# Patient Record
Sex: Female | Born: 1955 | Race: White | Hispanic: No | Marital: Single | Smoking: Former smoker
Health system: Southern US, Community
[De-identification: ages and names within clinical notes are randomized; demographics above are authoritative.]

## PROBLEM LIST (undated history)

## (undated) DIAGNOSIS — M1990 Unspecified osteoarthritis, unspecified site: Secondary | ICD-10-CM

## (undated) DIAGNOSIS — M545 Low back pain, unspecified: Secondary | ICD-10-CM

## (undated) DIAGNOSIS — K219 Gastro-esophageal reflux disease without esophagitis: Secondary | ICD-10-CM

## (undated) DIAGNOSIS — I4891 Unspecified atrial fibrillation: Secondary | ICD-10-CM

## (undated) DIAGNOSIS — E119 Type 2 diabetes mellitus without complications: Secondary | ICD-10-CM

## (undated) DIAGNOSIS — R0602 Shortness of breath: Secondary | ICD-10-CM

## (undated) DIAGNOSIS — I82409 Acute embolism and thrombosis of unspecified deep veins of unspecified lower extremity: Secondary | ICD-10-CM

## (undated) DIAGNOSIS — I2699 Other pulmonary embolism without acute cor pulmonale: Secondary | ICD-10-CM

## (undated) DIAGNOSIS — F419 Anxiety disorder, unspecified: Secondary | ICD-10-CM

## (undated) DIAGNOSIS — R51 Headache: Secondary | ICD-10-CM

## (undated) DIAGNOSIS — F329 Major depressive disorder, single episode, unspecified: Secondary | ICD-10-CM

## (undated) DIAGNOSIS — I10 Essential (primary) hypertension: Secondary | ICD-10-CM

## (undated) DIAGNOSIS — E7800 Pure hypercholesterolemia, unspecified: Secondary | ICD-10-CM

## (undated) DIAGNOSIS — I509 Heart failure, unspecified: Secondary | ICD-10-CM

## (undated) DIAGNOSIS — J189 Pneumonia, unspecified organism: Secondary | ICD-10-CM

## (undated) DIAGNOSIS — R58 Hemorrhage, not elsewhere classified: Secondary | ICD-10-CM

## (undated) DIAGNOSIS — M109 Gout, unspecified: Secondary | ICD-10-CM

## (undated) DIAGNOSIS — T45515A Adverse effect of anticoagulants, initial encounter: Secondary | ICD-10-CM

## (undated) DIAGNOSIS — F32A Depression, unspecified: Secondary | ICD-10-CM

## (undated) DIAGNOSIS — Z9289 Personal history of other medical treatment: Secondary | ICD-10-CM

## (undated) DIAGNOSIS — D649 Anemia, unspecified: Secondary | ICD-10-CM

## (undated) DIAGNOSIS — G43909 Migraine, unspecified, not intractable, without status migrainosus: Secondary | ICD-10-CM

## (undated) DIAGNOSIS — G8929 Other chronic pain: Secondary | ICD-10-CM

## (undated) DIAGNOSIS — R011 Cardiac murmur, unspecified: Secondary | ICD-10-CM

## (undated) DIAGNOSIS — E039 Hypothyroidism, unspecified: Secondary | ICD-10-CM

## (undated) DIAGNOSIS — G4733 Obstructive sleep apnea (adult) (pediatric): Secondary | ICD-10-CM

## (undated) DIAGNOSIS — M7989 Other specified soft tissue disorders: Secondary | ICD-10-CM

## (undated) DIAGNOSIS — N289 Disorder of kidney and ureter, unspecified: Secondary | ICD-10-CM

## (undated) DIAGNOSIS — N390 Urinary tract infection, site not specified: Secondary | ICD-10-CM

## (undated) HISTORY — PX: PARS PLANA REPAIR OF RETINAL DEATACHMENT: SHX2165

## (undated) HISTORY — PX: REFRACTIVE SURGERY: SHX103

## (undated) HISTORY — PX: CHOLECYSTECTOMY: SHX55

## (undated) HISTORY — PX: VENA CAVA FILTER PLACEMENT: SUR1032

## (undated) HISTORY — PX: PARS PLANA VITRECTOMY: SHX2166

## (undated) HISTORY — PX: CATARACT EXTRACTION W/ INTRAOCULAR LENS  IMPLANT, BILATERAL: SHX1307

## (undated) HISTORY — PX: EYE SURGERY: SHX253

---

## 1997-10-12 ENCOUNTER — Emergency Department (HOSPITAL_COMMUNITY): Admission: EM | Admit: 1997-10-12 | Discharge: 1997-10-12 | Payer: Self-pay | Admitting: Emergency Medicine

## 1998-08-18 ENCOUNTER — Encounter: Payer: Self-pay | Admitting: Emergency Medicine

## 1998-08-18 ENCOUNTER — Emergency Department (HOSPITAL_COMMUNITY): Admission: EM | Admit: 1998-08-18 | Discharge: 1998-08-18 | Payer: Self-pay | Admitting: Emergency Medicine

## 2000-01-14 ENCOUNTER — Emergency Department (HOSPITAL_COMMUNITY): Admission: EM | Admit: 2000-01-14 | Discharge: 2000-01-14 | Payer: Self-pay | Admitting: Emergency Medicine

## 2000-01-14 ENCOUNTER — Encounter: Payer: Self-pay | Admitting: Emergency Medicine

## 2000-01-29 ENCOUNTER — Ambulatory Visit (HOSPITAL_COMMUNITY): Admission: RE | Admit: 2000-01-29 | Discharge: 2000-01-29 | Payer: Self-pay | Admitting: Surgery

## 2000-02-13 ENCOUNTER — Encounter (INDEPENDENT_AMBULATORY_CARE_PROVIDER_SITE_OTHER): Payer: Self-pay

## 2000-02-13 ENCOUNTER — Ambulatory Visit (HOSPITAL_COMMUNITY): Admission: RE | Admit: 2000-02-13 | Discharge: 2000-02-14 | Payer: Self-pay | Admitting: Surgery

## 2000-05-06 ENCOUNTER — Encounter: Admission: RE | Admit: 2000-05-06 | Discharge: 2000-05-06 | Payer: Self-pay

## 2000-05-06 ENCOUNTER — Encounter: Payer: Self-pay | Admitting: Internal Medicine

## 2000-07-23 ENCOUNTER — Encounter: Payer: Self-pay | Admitting: Ophthalmology

## 2000-07-23 ENCOUNTER — Ambulatory Visit (HOSPITAL_COMMUNITY): Admission: RE | Admit: 2000-07-23 | Discharge: 2000-07-24 | Payer: Self-pay | Admitting: Ophthalmology

## 2000-10-06 ENCOUNTER — Encounter: Admission: RE | Admit: 2000-10-06 | Discharge: 2000-11-04 | Payer: Self-pay | Admitting: Internal Medicine

## 2000-10-06 ENCOUNTER — Other Ambulatory Visit: Admission: RE | Admit: 2000-10-06 | Discharge: 2000-10-06 | Payer: Self-pay | Admitting: Obstetrics and Gynecology

## 2001-01-07 ENCOUNTER — Ambulatory Visit (HOSPITAL_COMMUNITY): Admission: RE | Admit: 2001-01-07 | Discharge: 2001-01-07 | Payer: Self-pay | Admitting: Ophthalmology

## 2001-01-19 ENCOUNTER — Ambulatory Visit (HOSPITAL_COMMUNITY): Admission: RE | Admit: 2001-01-19 | Discharge: 2001-01-20 | Payer: Self-pay | Admitting: Ophthalmology

## 2001-01-22 ENCOUNTER — Emergency Department (HOSPITAL_COMMUNITY): Admission: EM | Admit: 2001-01-22 | Discharge: 2001-01-22 | Payer: Self-pay | Admitting: Emergency Medicine

## 2001-02-22 ENCOUNTER — Encounter: Payer: Self-pay | Admitting: Internal Medicine

## 2001-02-22 ENCOUNTER — Encounter: Admission: RE | Admit: 2001-02-22 | Discharge: 2001-02-22 | Payer: Self-pay | Admitting: Internal Medicine

## 2001-03-24 ENCOUNTER — Encounter: Payer: Self-pay | Admitting: Internal Medicine

## 2001-03-24 ENCOUNTER — Encounter: Admission: RE | Admit: 2001-03-24 | Discharge: 2001-03-24 | Payer: Self-pay | Admitting: Internal Medicine

## 2001-05-07 ENCOUNTER — Encounter: Admission: RE | Admit: 2001-05-07 | Discharge: 2001-05-07 | Payer: Self-pay | Admitting: Internal Medicine

## 2001-05-07 ENCOUNTER — Encounter: Payer: Self-pay | Admitting: Internal Medicine

## 2001-07-15 ENCOUNTER — Encounter: Payer: Self-pay | Admitting: Ophthalmology

## 2001-07-16 ENCOUNTER — Ambulatory Visit (HOSPITAL_COMMUNITY): Admission: RE | Admit: 2001-07-16 | Discharge: 2001-07-17 | Payer: Self-pay | Admitting: Ophthalmology

## 2002-05-12 ENCOUNTER — Encounter: Payer: Self-pay | Admitting: Ophthalmology

## 2002-05-13 ENCOUNTER — Ambulatory Visit (HOSPITAL_COMMUNITY): Admission: RE | Admit: 2002-05-13 | Discharge: 2002-05-13 | Payer: Self-pay | Admitting: Ophthalmology

## 2002-05-14 ENCOUNTER — Encounter: Payer: Self-pay | Admitting: Emergency Medicine

## 2002-05-14 ENCOUNTER — Inpatient Hospital Stay (HOSPITAL_COMMUNITY): Admission: EM | Admit: 2002-05-14 | Discharge: 2002-05-23 | Payer: Self-pay | Admitting: Emergency Medicine

## 2002-05-15 ENCOUNTER — Encounter: Payer: Self-pay | Admitting: Internal Medicine

## 2002-05-16 ENCOUNTER — Encounter: Payer: Self-pay | Admitting: Internal Medicine

## 2002-06-07 ENCOUNTER — Emergency Department (HOSPITAL_COMMUNITY): Admission: EM | Admit: 2002-06-07 | Discharge: 2002-06-07 | Payer: Self-pay

## 2002-06-29 ENCOUNTER — Encounter: Admission: RE | Admit: 2002-06-29 | Discharge: 2002-06-29 | Payer: Self-pay | Admitting: Internal Medicine

## 2002-06-29 ENCOUNTER — Encounter: Payer: Self-pay | Admitting: Internal Medicine

## 2002-07-26 ENCOUNTER — Ambulatory Visit (HOSPITAL_COMMUNITY): Admission: RE | Admit: 2002-07-26 | Discharge: 2002-07-26 | Payer: Self-pay | Admitting: Ophthalmology

## 2005-08-06 ENCOUNTER — Encounter: Admission: RE | Admit: 2005-08-06 | Discharge: 2005-08-06 | Payer: Self-pay | Admitting: Internal Medicine

## 2005-11-18 ENCOUNTER — Encounter: Admission: RE | Admit: 2005-11-18 | Discharge: 2005-11-18 | Payer: Self-pay | Admitting: Internal Medicine

## 2006-04-06 ENCOUNTER — Encounter: Payer: Self-pay | Admitting: Cardiology

## 2006-04-06 ENCOUNTER — Ambulatory Visit: Payer: Self-pay | Admitting: Cardiology

## 2006-04-06 ENCOUNTER — Inpatient Hospital Stay (HOSPITAL_COMMUNITY): Admission: EM | Admit: 2006-04-06 | Discharge: 2006-04-10 | Payer: Self-pay | Admitting: Emergency Medicine

## 2006-04-30 ENCOUNTER — Encounter: Admission: RE | Admit: 2006-04-30 | Discharge: 2006-04-30 | Payer: Self-pay | Admitting: Internal Medicine

## 2006-10-27 ENCOUNTER — Encounter: Admission: RE | Admit: 2006-10-27 | Discharge: 2006-10-27 | Payer: Self-pay | Admitting: Internal Medicine

## 2007-01-13 ENCOUNTER — Ambulatory Visit (HOSPITAL_COMMUNITY): Admission: RE | Admit: 2007-01-13 | Discharge: 2007-01-13 | Payer: Self-pay | Admitting: Gastroenterology

## 2007-07-13 ENCOUNTER — Other Ambulatory Visit: Admission: RE | Admit: 2007-07-13 | Discharge: 2007-07-13 | Payer: Self-pay | Admitting: Obstetrics and Gynecology

## 2007-10-29 ENCOUNTER — Encounter: Admission: RE | Admit: 2007-10-29 | Discharge: 2007-10-29 | Payer: Self-pay | Admitting: Obstetrics and Gynecology

## 2008-02-13 ENCOUNTER — Emergency Department (HOSPITAL_COMMUNITY): Admission: EM | Admit: 2008-02-13 | Discharge: 2008-02-13 | Payer: Self-pay | Admitting: Emergency Medicine

## 2008-09-18 ENCOUNTER — Encounter: Admission: RE | Admit: 2008-09-18 | Discharge: 2008-09-18 | Payer: Self-pay | Admitting: Internal Medicine

## 2008-09-20 ENCOUNTER — Encounter: Admission: RE | Admit: 2008-09-20 | Discharge: 2008-09-20 | Payer: Self-pay | Admitting: Internal Medicine

## 2008-11-15 ENCOUNTER — Encounter: Admission: RE | Admit: 2008-11-15 | Discharge: 2008-11-15 | Payer: Self-pay | Admitting: Internal Medicine

## 2009-01-08 ENCOUNTER — Encounter: Admission: RE | Admit: 2009-01-08 | Discharge: 2009-01-08 | Payer: Self-pay | Admitting: Internal Medicine

## 2009-08-01 ENCOUNTER — Emergency Department (HOSPITAL_COMMUNITY): Admission: EM | Admit: 2009-08-01 | Discharge: 2009-08-01 | Payer: Self-pay | Admitting: Emergency Medicine

## 2009-10-01 ENCOUNTER — Encounter (INDEPENDENT_AMBULATORY_CARE_PROVIDER_SITE_OTHER): Payer: Self-pay | Admitting: Internal Medicine

## 2009-10-01 ENCOUNTER — Inpatient Hospital Stay (HOSPITAL_COMMUNITY): Admission: EM | Admit: 2009-10-01 | Discharge: 2009-10-03 | Payer: Self-pay | Admitting: Emergency Medicine

## 2009-10-01 ENCOUNTER — Ambulatory Visit: Payer: Self-pay | Admitting: Vascular Surgery

## 2009-11-13 ENCOUNTER — Encounter: Admission: RE | Admit: 2009-11-13 | Discharge: 2009-11-13 | Payer: Self-pay | Admitting: Internal Medicine

## 2009-11-13 ENCOUNTER — Ambulatory Visit: Payer: Self-pay | Admitting: Vascular Surgery

## 2009-11-13 ENCOUNTER — Encounter (INDEPENDENT_AMBULATORY_CARE_PROVIDER_SITE_OTHER): Payer: Self-pay | Admitting: Emergency Medicine

## 2009-11-13 ENCOUNTER — Emergency Department (HOSPITAL_COMMUNITY): Admission: EM | Admit: 2009-11-13 | Discharge: 2009-11-13 | Payer: Self-pay | Admitting: Emergency Medicine

## 2010-04-28 ENCOUNTER — Encounter: Payer: Self-pay | Admitting: Internal Medicine

## 2010-04-29 ENCOUNTER — Encounter: Payer: Self-pay | Admitting: Internal Medicine

## 2010-06-21 LAB — URINALYSIS, ROUTINE W REFLEX MICROSCOPIC
Bilirubin Urine: NEGATIVE
Ketones, ur: NEGATIVE mg/dL
Protein, ur: 100 mg/dL — AB
pH: 5 (ref 5.0–8.0)

## 2010-06-21 LAB — URINE MICROSCOPIC-ADD ON

## 2010-06-23 LAB — CBC
HCT: 26.5 % — ABNORMAL LOW (ref 36.0–46.0)
HCT: 28.5 % — ABNORMAL LOW (ref 36.0–46.0)
Hemoglobin: 9.3 g/dL — ABNORMAL LOW (ref 12.0–15.0)
Hemoglobin: 9.6 g/dL — ABNORMAL LOW (ref 12.0–15.0)
MCH: 30.9 pg (ref 26.0–34.0)
MCH: 31.1 pg (ref 26.0–34.0)
MCH: 31.5 pg (ref 26.0–34.0)
MCHC: 34 g/dL (ref 30.0–36.0)
MCHC: 34.1 g/dL (ref 30.0–36.0)
MCHC: 34.2 g/dL (ref 30.0–36.0)
MCHC: 34.7 g/dL (ref 30.0–36.0)
MCV: 90.8 fL (ref 78.0–100.0)
MCV: 90.9 fL (ref 78.0–100.0)
MCV: 91.3 fL (ref 78.0–100.0)
Platelets: 105 10*3/uL — ABNORMAL LOW (ref 150–400)
RBC: 3.02 MIL/uL — ABNORMAL LOW (ref 3.87–5.11)
RBC: 3.09 MIL/uL — ABNORMAL LOW (ref 3.87–5.11)
RDW: 13.9 % (ref 11.5–15.5)
RDW: 14.4 % (ref 11.5–15.5)
WBC: 5.1 10*3/uL (ref 4.0–10.5)
WBC: 7 10*3/uL (ref 4.0–10.5)

## 2010-06-23 LAB — POCT CARDIAC MARKERS
CKMB, poc: 1.6 ng/mL (ref 1.0–8.0)
Troponin i, poc: <0.05 ng/mL (ref 0.00–0.09)
Troponin i, poc: <0.05 ng/mL (ref 0.00–0.09)

## 2010-06-23 LAB — CARDIAC PANEL(CRET KIN+CKTOT+MB+TROPI)
CK, MB: 4.3 ng/mL — ABNORMAL HIGH (ref 0.3–4.0)
Relative Index: 1.5 (ref 0.0–2.5)
Relative Index: 1.5 (ref 0.0–2.5)
Relative Index: 1.6 (ref 0.0–2.5)
Total CK: 237 U/L — ABNORMAL HIGH (ref 7–177)
Troponin I: 0.05 ng/mL (ref 0.00–0.06)
Troponin I: 0.08 ng/mL — ABNORMAL HIGH (ref 0.00–0.06)

## 2010-06-23 LAB — GLUCOSE, CAPILLARY
Glucose-Capillary: 117 mg/dL — ABNORMAL HIGH (ref 70–99)
Glucose-Capillary: 133 mg/dL — ABNORMAL HIGH (ref 70–99)
Glucose-Capillary: 139 mg/dL — ABNORMAL HIGH (ref 70–99)
Glucose-Capillary: 153 mg/dL — ABNORMAL HIGH (ref 70–99)
Glucose-Capillary: 156 mg/dL — ABNORMAL HIGH (ref 70–99)
Glucose-Capillary: 164 mg/dL — ABNORMAL HIGH (ref 70–99)
Glucose-Capillary: 88 mg/dL (ref 70–99)

## 2010-06-23 LAB — BASIC METABOLIC PANEL
CO2: 28 mEq/L (ref 19–32)
Calcium: 8.4 mg/dL (ref 8.4–10.5)
Calcium: 8.4 mg/dL (ref 8.4–10.5)
Creatinine, Ser: 1.21 mg/dL — ABNORMAL HIGH (ref 0.4–1.2)
GFR calc Af Amer: 50 mL/min — ABNORMAL LOW (ref >60)
GFR calc Af Amer: 56 mL/min — ABNORMAL LOW (ref >60)
GFR calc non Af Amer: 41 mL/min — ABNORMAL LOW (ref >60)
GFR calc non Af Amer: 47 mL/min — ABNORMAL LOW (ref >60)
Glucose, Bld: 138 mg/dL — ABNORMAL HIGH (ref 70–99)
Sodium: 142 mEq/L (ref 135–145)

## 2010-06-23 LAB — DIFFERENTIAL
Basophils Absolute: 0 10*3/uL (ref 0.0–0.1)
Basophils Relative: 1 % (ref 0–1)
Eosinophils Absolute: 0.2 10*3/uL (ref 0.0–0.7)
Eosinophils Relative: 3 % (ref 0–5)
Monocytes Absolute: 0.6 10*3/uL (ref 0.1–1.0)
Neutro Abs: 5.3 10*3/uL (ref 1.7–7.7)

## 2010-06-23 LAB — COMPREHENSIVE METABOLIC PANEL
Albumin: 3 g/dL — ABNORMAL LOW (ref 3.5–5.2)
Alkaline Phosphatase: 53 U/L (ref 39–117)
BUN: 17 mg/dL (ref 6–23)
Calcium: 8.4 mg/dL (ref 8.4–10.5)
Creatinine, Ser: 1.07 mg/dL (ref 0.4–1.2)
Glucose, Bld: 124 mg/dL — ABNORMAL HIGH (ref 70–99)
Total Protein: 5.9 g/dL — ABNORMAL LOW (ref 6.0–8.3)

## 2010-06-23 LAB — HEPATIC FUNCTION PANEL
ALT: 14 U/L (ref 0–35)
AST: 20 U/L (ref 0–37)
Bilirubin, Direct: 0.1 mg/dL (ref 0.0–0.3)
Indirect Bilirubin: 0.7 mg/dL (ref 0.3–0.9)
Total Protein: 6.4 g/dL (ref 6.0–8.3)

## 2010-06-23 LAB — URINALYSIS, ROUTINE W REFLEX MICROSCOPIC
Bilirubin Urine: NEGATIVE
Glucose, UA: NEGATIVE mg/dL
Ketones, ur: NEGATIVE mg/dL
Nitrite: NEGATIVE
Urobilinogen, UA: 0.2 mg/dL (ref 0.0–1.0)

## 2010-06-23 LAB — URINE CULTURE: Colony Count: 15000

## 2010-06-23 LAB — BRAIN NATRIURETIC PEPTIDE
Pro B Natriuretic peptide (BNP): 163 pg/mL — ABNORMAL HIGH (ref 0.0–100.0)
Pro B Natriuretic peptide (BNP): 419 pg/mL — ABNORMAL HIGH (ref 0.0–100.0)

## 2010-06-23 LAB — LIPID PANEL
Cholesterol: 118 mg/dL (ref 0–200)
HDL: 49 mg/dL (ref >39)
LDL Cholesterol: 50 mg/dL (ref 0–99)
Total CHOL/HDL Ratio: 2.4 RATIO
VLDL: 19 mg/dL (ref 0–40)

## 2010-06-23 LAB — RETICULOCYTES
RBC.: 3.28 MIL/uL — ABNORMAL LOW (ref 3.87–5.11)
Retic Count, Absolute: 49.2 10*3/uL (ref 19.0–186.0)
Retic Ct Pct: 1.5 % (ref 0.4–3.1)

## 2010-06-23 LAB — FERRITIN: Ferritin: 40 ng/mL (ref 10–291)

## 2010-06-23 LAB — POCT I-STAT, CHEM 8
BUN: 17 mg/dL (ref 6–23)
Calcium, Ion: 1.09 mmol/L — ABNORMAL LOW (ref 1.12–1.32)
Chloride: 109 mEq/L (ref 96–112)
Creatinine, Ser: 1 mg/dL (ref 0.4–1.2)
Glucose, Bld: 113 mg/dL — ABNORMAL HIGH (ref 70–99)
TCO2: 23 mmol/L (ref 0–100)

## 2010-06-23 LAB — MAGNESIUM: Magnesium: 1.9 mg/dL (ref 1.5–2.5)

## 2010-06-23 LAB — FOLATE: Folate: 11.2 ng/mL

## 2010-06-23 LAB — URINE MICROSCOPIC-ADD ON

## 2010-06-23 LAB — CK TOTAL AND CKMB (NOT AT ARMC): Relative Index: 1.6 (ref 0.0–2.5)

## 2010-08-20 NOTE — Op Note (Signed)
[name], [name]              ACCOUNT NO.:  [id number]   MEDICAL RECORD NO.:  [id number]          PATIENT TYPE:  AMB   LOCATION:  ENDO                         FACILITY:  [facility]   PHYSICIAN:  Nelwyn Salisbury, M.D.  DATE OF BIRTH:  [birth year]   DATE OF PROCEDURE:  01/13/2007  DATE OF DISCHARGE:                               OPERATIVE REPORT   PROCEDURES PERFORMED:  Screening colonoscopy.   ENDOSCOPIST:  Nelwyn Salisbury, M.D.   INSTRUMENT USED:  Pentax video colonoscope.   INDICATIONS FOR PROCEDURE:  Patient is a 55 year old white female,  undergoing screening colonoscopy; patient has a history of diabetes and  depression.  Rule out colonic polyps, masses, etc.   PRE-PROCEDURE PREPARATION:  Informed consent was procured from the  patient. The patient fasted for eight hours prior to the procedure and  took a bottle of magnesium citrate and a gallon of NuLytely the night  prior to the procedure.  Risks and benefits of the procedure, including  a 10% miss rate of cancer and polyp, were discussed with the patient, as  well.   PREPROCEDURE PHYSICAL:  The patient had stable vital signs.  NECK:  Supple.  CHEST:  Clear to auscultation.  S1, S2, regular.  ABDOMEN:  Soft with normal bowel sounds.   DESCRIPTION OF PROCEDURE:  The patient was placed in left lateral  decubitus position, sedated with 100 micrograms of Fentanyl and 10 mg of  Versed, given intravenously in slow, incremental doses. Once the patient  was adequately sedated and maintained on low flow oxygen and continuous  cardiac monitoring, the Pentax video colonoscope was advanced from the  rectum to the cecum.  The appendiceal orifice and ileocecal valve were  clearly visualized and photographed.  No masses, polyps, erosions or  ulcerations were seen.  A few early scattered diverticula were noted.  Retroflexion in the rectum revealed no abnormalities.   IMPRESSION:  Normal colonoscopy to the cecum, except for a few  early  scattered diverticula.  No masses or polyps seen.   RECOMMENDATIONS:  1. Continue high-fiber diet with liberal fluid intake.  2. Repeat colonoscopy in the next ten years. If the patient has any      abnormal symptoms in the interim, she is to contact the office      immediately for further recommendations.  3. Outpatient followup as need arises in the future.      Nelwyn Salisbury, M.D.  Electronically Signed     JNM/MEDQ  D:  01/13/2007  T:  01/13/2007  Job:  [id number]   cc:   [name], M.D.  Fax: [phone]

## 2010-08-23 NOTE — H&P (Signed)
[name], [name]              ACCOUNT NO.:  [id number]   MEDICAL RECORD NO.:  [id number]          PATIENT TYPE:  INP   LOCATION:  5702                         FACILITY:  [facility]   PHYSICIAN:  Cherene Altes, M.D.DATE OF BIRTH:  1955-05-03   DATE OF ADMISSION:  04/05/2006  DATE OF DISCHARGE:                              HISTORY & PHYSICAL   PRIMARY CARE PHYSICIAN:  Dr. [name].   CHIEF COMPLAINT:  Fever, chills with chest wall pain and shortness of  breath.   HISTORY OF PRESENT ILLNESS:  [name] is a 55 year old morbidly obese  female who presents to the ER with a 24-hour history of significant  shortness of breath and subjective fevers and chills.  She reports that  she has been in her usual state of health up until early in the morning  hours of yesterday.  At that time she awoke and found herself to be  suffering with a shaking chill.  This subjective fever with shaking  chill continued throughout the morning and into the day.  She noted that  this was associated with a sense of shortness of breath.  She also  reports a stabbing, sharp-type right-sided chest pain that then radiated  into the back between the shoulders.  This was consistent, she reports,  with her pulmonary embolism years ago.  She denies dyspnea on exertion.  She denies substernal chest-type pressure.  She denies hematemesis.  She  did experience some nausea and vomited 1 time while in the ER in  association with her complaint of shortness of breath.  There have been  no palpitations.  There has been no headache.  There has been no  diarrhea or abdominal discomfort.  There has been no dyspnea on exertion  above the patient's baseline.  There is no unilateral lower extremity  swelling and no calf tenderness.  CT scan in the ER has been  accomplished and has failed to reveal evidence of a pulmonary embolism.  Chest x-ray did raise a question of a right lower lobe infiltrate which  is clearly evident  on my review of her chest film.  The patient does  report that she was treated for a sinus infection approximately 2  weeks ago and states that she never felt that she completely recovered  although she did improve after treatment with antibiotics.   REVIEW OF SYSTEMS:  Comprehensive review of systems is unremarkable with  the exception of positive ailments noted in the history of present  illness above.   PAST MEDICAL HISTORY:  1. 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  2. Hypertension.  3. Depression.  4. Status cholecystectomy 2001.  5. Status C-section.  6. 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  7. Morbid obesity.   MEDICATIONS:  1. Lantus 15 units q. day at 7 a.m.  2. Metformin 1000 mg b.i.d.  3. Amaryl 10 mg q. day.  4. Actos 15 mg q.h.s.  5. Benicar 40 mg q. day.  6. Crestor 10 mg q. day.  7. Lasix 80  mg b.i.d.   ALLERGIES:  MORPHINE.  CODEINE.   FAMILY HISTORY:  Noncontributory to this admission.   SOCIAL HISTORY:  The patient is divorced.  She does not smoke.  She does  not drink alcohol.  She lives in [place] with her son.   DATA REVIEWED:  Shows white count is elevated at 15.4, hemoglobin is low  at 11.7, MCV is 87.  Sodium is normal, potassium is low at 3.3.  Chloride, bicarbonate, BUN, and creatinine are normal.  Calcium is  normal.  Serum glucose is markedly elevated at 252.  LFT's are normal.  Albumin is low at 2.9.  Myoglobin is elevated at 227.  CK-MB, troponin-I  are negative.  D-dimer is elevated at 3.33.  INR is normal.  Urinalysis  reveals 100 glucose, small hemoglobin, greater than 300 protein, and 11-  20 red blood cells.  Chest x-ray reveals right lower lobe opacity.  CT  scan of the chest reveals no pulmonary embolism.  A 12-lead EKG has been  ordered and is pending.   PHYSICAL EXAMINATION:  VITAL SIGNS:  Temperature 101.6, blood pressure  179/81, heart rate 72,  respiratory rate 26, O2 sat 96% on room air, CBG  is 210.  GENERAL:  Morbidly obese female in no acute respiratory distress at the  present time.  HEENT:  Normocephalic atraumatic.  Pupils equal, round, and reactive to  light and accommodation.  Extraocular muscles are intact bilaterally.  OC/OP clear.  NECK:  Short, obese, unable to appreciate JVD, unable to comment on  lymphadenopathy.  LUNGS:  Right basilar crackles without wheezes with good air movement  throughout all fields bilaterally.  CARDIOVASCULAR:  Distant heart sounds, no appreciable murmur, gallop, or  rub, with normal S1, S2.  ABDOMEN:  Morbidly obese, soft, bowel sounds present.  No  hepatosplenomegaly or rebound, no ascites.  EXTREMITIES:  One+ bilateral lower extremity edema to just above the  knee with no significant erythema.  No cyanosis and no clubbing.  NEUROLOGIC:  There is 5/5 strength bilateral upper and lower  extremities, intact sensation touch throughout.  Alert and oriented x4.  Cranial nerves II-XII intact bilaterally.  No Babinski.   IMPRESSION AND PLAN:  1. Right lower lobe community-acquired pneumonia - Chest x-ray      findings are consistent with community-acquired pneumonia.      Physical exam and history are also consistent with such.  The      patient has been dosed with Rocephin and Azithromycin in the      emergency room.  I will continue these interventions IV.  2. Uncontrolled diabetes mellitus - The patient presents with a serum      glucose of 252.  We will dose the patient with sliding scale      insulin.  We will also administer Lantus insulin.  We will follow      her CBG's closely.  3. Uncontrolled hypertension - The patient presents with uncontrolled      high blood pressure.  It is not clear if she has taken her      medications today or not.  I will continue her Benicar and her     Lasix.  We will follow her blood pressure closely and consider      adding additional agents as  necessary.  4. Chest pain - The patient is having some chest pain.  This is likely      related to her pneumonia.  She does have some risk factors  for      coronary artery disease however, and therefore I will obtain EKG      and serial enzymes to assure that she is not suffering with      evidence of an unstable angina pectoris.  5. Hypokalemia - The patient is suffering with a mild hypokalemia.      This is likely secondary to high dose Lasix therapy without      significant replacement.  I will replace the patient with both p.o.      and IV potassium and we will follow her potassium trend.  6. Normocytic anemia - The patient has a normocytic anemia with a      hemoglobin of 11.7.  There is no clear evidence of bleeding.  We      will guaiac stools x3.  I will send blood for iron studies.  7. Hematuria - The patient's urinalysis is remarkable for hematuria.      The exact etiology of this is not clear.  I will obtain a KUB to      make sure there is no evidence of renal stones.  Otherwise we will      need to followup a urinalysis as the patient's hospitalization      progresses.  8. History of pulmonary embolism with recurrent DVT's - The patient      has been off anticoagulation for a number of years now.  She      continues to have a high daily risk of DVT formation.  Fortunately      however, pulmonary embolism has been ruled out during this      hospitalization with a CT scan of the chest.  I will cover her with      Lovenox adjusted to her size for DVT prophylaxis during her      hospital stay.      Cherene Altes, M.D.  Electronically Signed     JTM/MEDQ  D:  04/06/2006  T:  04/06/2006  Job:  [id number]   cc:   [name], M.D.

## 2010-08-23 NOTE — Op Note (Signed)
[place]. [facility]  Patient:    [name], [name]                     MRN: [id number] Proc. Date: 02/13/00 Adm. Date:  [id number] Disc. Date: [id number] Attending:  Coralie Keens A                           Operative Report  PREOPERATIVE DIAGNOSIS:  Symptomatic cholelithiasis.  POSTOPERATIVE DIAGNOSIS:  Symptomatic cholelithiasis.  OPERATION:  Laparoscopic cholecystectomy.  SURGEON:  Coralie Keens, M.D.  ASSISTANT:  Odis Hollingshead, M.D.  ANESTHESIA:  General endotracheal anesthesia.  ESTIMATED BLOOD LOSS:  Minimal  DESCRIPTION OF PROCEDURE:  The patient was brought to the operating room and identified as [name]. She was placed supine on the operating room table and general anesthesia was induced.  Her abdomen was then prepped and draped in the usual sterile fashion.  Using #15 blade a transverse incision was made above the umbilicus.  Incision was carried down through the fascia with the scalpel.  The fascia was then opened with a scalpel. Hemostat was then used to pass to the peritoneal cavity.  Next a 0 Vicryl pursestring suture was placed around the fascia opening.  The Hasson port was then placed through the opening and insufflation of the abdomen was begun.  Next, an 11 mm port was placed in the patients epigastrium, and two 5 mm ports were placed in the patients right flank under direct vision. The gallbladder was then identified and grasped and retracted above the liver bed.  Dissection was then carried out at the base of the gallbladder.  The cystic duct was identified and clipped three times proximally and once distally and transected with scissors. The cystic artery along with an anterior and posterior branches were identified and clipped several times proximally and distally and transected with the scissors as well.  The gallbladder was then slowly dissected free from the liver bed with the electrocautery.  Hemostasis in  the liver bed was achieved with a cautery.  Once the gallbladder was completely removed from the liver bed, it was removed through the port at the umbilicus.  The 0 Vicryl at the umbilicus was then tied in place. The abdomen was then irrigated with normal saline.  Again hemostasis appeared to be achieved.  All ports were then removed under direct vision and the abdomen was deflated.  All skin incisions were anesthetized with 0.25% Marcaine and then closed with 4-0 Vicryl subcuticular stitches.  Steri-Strips, gauze and tape were then applied.  The patient tolerated the procedure well.  All sponge, needle and instrument counts were correct at the end of the procedure.  The patient was then extubated in the operating room and taken in a stable condition to the recovery room. DD:  02/13/00 TD:  02/14/00 Job: [id number]

## 2010-08-23 NOTE — H&P (Signed)
[place]  Patient:    [name], [name] Visit Number: [id number] MRN: [id number]          Service Type: DSU Location: [id number] Attending Physician:  Charlette Caffey Dictated by:   Garey Ham, M.D. Admit Date:  07/16/2001 Discharge Date: 07/17/2001                           History and Physical  CHIEF COMPLAINT: This was a planned outpatient readmission of this 55 year old white female, non-insulin dependent diabetic, admitted for vitreoretinal surgery of the left eye.  HISTORY OF PRESENT ILLNESS: This patient has a long history of non-insulin dependent diabetes mellitus and secondary complications of diabetic retinopathy with recurrent vitreous hemorrhage.  The patient has undergone three posterior vitrectomies of the right eye over the last year to 1-1/2 years by Dr. Zadie Rhine and myself.  She finally has achieved a clear vitreous, attached retina, with extensive panretinal laser photocoagulation and no return vitreous hemorrhage.  The left eye recently, however, has begun a similar course of proliferative diabetic retinopathy and recurrent vitreous hemorrhage.  She is now admitted for similar vitrectomy surgery of the left eye.  PAST MEDICAL HISTORY: The patient is in stable general health, under the care of Dr. Mellody Drown, and is felt to be in satisfactory condition for the proposed surgery.  MEDICATIONS: The patient currently takes several medications to control her diabetes mellitus, including:  1. Amaryl.  2. Furosemide.  3. Glucophage.  4. Diovan.  She is felt to be minimal risk for cardiorespiratory complications.  She has had a recent stress test.  REVIEW OF SYSTEMS: No cardiorespiratory complaints.  PHYSICAL EXAMINATION:  VITAL SIGNS: As recorded on admission.  GENERAL APPEARANCE: The patient is an obese, well-nourished, well-developed white female in no acute distress.  HEENT: Eyes, visual acuity last  recorded at 20/40+ right eye, 20/50+ left eye. External ocular and slit-lamp examination, the eyes are white and clear, with slight posterior subcapsular cataract formation right eye, left eye clear. Applanation tonometry 14 mm right eye, 18 mm left eye.  Detailed fundus examination right eye - a clear vitreous, attached retina, extensive panretinal laser photocoagulation, optic nerve slightly pale, disc cup ratio 0.5, old inactive proliferative diabetic retinopathy; left eye vitreous hemorrhage, scattered old panretinal laser photocoagulation with several gap areas; there is no proliferation and neovascularization along the superior temporal retinal blood vessel and along the superior nasal blood vessel; there is proliferative tissue at the optic nerve covering the optic nerve and adjacent areas; old peripheral laser photocoagulation can be seen.  CHEST: Lungs clear to percussion and auscultation.  HEART: Normal sinus rhythm.  No cardiomegaly, no murmurs.  ABDOMEN: Negative.  EXTREMITIES: Patient has has a history of thrombophlebitis and has requested pulsatile postoperative stockings.  SURGICAL PLAN: Posterior vitrectomy with membrane peeling and extensive Endo laser photocoagulation. Dictated by:   Garey Ham, M.D. Attending Physician:  Charlette Caffey DD:  07/16/01 TD:  07/16/01 Job: [id number]

## 2010-08-23 NOTE — H&P (Signed)
NAME:  [name], [name]                        ACCOUNT NO.:  [id number]   MEDICAL RECORD NO.:  [id number]                   PATIENT TYPE:  INP   LOCATION:  5735                                 FACILITY:  [facility]   PHYSICIAN:  Doree Albee, M.D.             DATE OF BIRTH:  Aug 16, 1955   DATE OF ADMISSION:  05/14/2002  DATE OF DISCHARGE:                                HISTORY & PHYSICAL   HISTORY:  This is a very pleasant 55 year old diabetic lady who gives a one  week history of cough productive of yellow sputum associated with increasing  dyspnea.  She now becomes short of breath on very minimal exertion.  She was  due to have cataract surgery the day before yesterday which was canceled  because of excessive coughing.  She presents to the emergency room and it is  felt that she has a left upper lobe pneumonia.   PAST MEDICAL HISTORY:  She has had type 2 diabetes for the last 12 years and  has also diabetic retinopathy requiring laser surgery approximately a year  ago.  Her diabetes has recently been out of control probably due to this  infection.  She also has a history of long-standing hypertension.   SURGERIES:  Cholecystectomy and cesarean section are the surgeries that she  has had previously.   ALLERGIES:  Codeine.   MEDICATIONS:  Norvasc 10 mg q.day, Lasix 20 mg q.day, Glucophage one gram  b.i.d., Amaryl 4 mg q.day, Actos (dose unknown but she takes one tablet  q.day).   SOCIAL HISTORY:  She is a divorced lady who lives with her son.  She does  not smoke and does not drink alcohol.  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   FAMILY HISTORY:  Noncontributory.   REVIEW OF SYSTEMS:  Apart from symptoms mentioned above there are no other  symptoms referable to the cardiovascular, respiratory, gastrointestinal,  genitourinary, neurological, musculoskeletal, neurological, dermatological,  psychiatric, endocrine systems.   PHYSICAL EXAMINATION:   GENERAL:  She looks afebrile.  She is obese.  She is hemodynamically stable.   CARDIAC:  Heart sounds are present with a systolic murmur which has been  present and has been heard before.  This apparently has been investigated  with an echocardiogram and there was no obvious problem. Jugular venous  pressure is not raised.   LUNGS:  Lung fields are clear clinically with occasional scattered wheezing  with coughing.   ABDOMEN:  Soft and nontender.   NEUROLOGIC:  She is alert and oriented with no focal neurological signs.   INVESTIGATIONS:  Chest x-ray has been reported as a possible left upper lobe  infiltrate but to my eye this is not very clear.  Electrocardiogram shows normal sinus rhythm with evidence of left  ventricular hypertrophy.   IMPRESSION AND PLAN:  1. Left upper lobe pneumonia.  2. Type 2 diabetes mellitus.  3. Hypertension.   PLAN:  We will admit, give her intravenous antibiotics and control her  diabetes with medication and a sliding scale of Insulin.  A chest x-ray  should be repeated to see if this possible left upper lobe infiltrate  flourishes.  An echocardiogram will be done to make sure that there is no  cardiac component to her dyspnea.  Further recommendations will depend on  patient's progress.                                               Doree Albee, M.D.    NCG/MEDQ  D:  05/14/2002  T:  05/15/2002  Job:  [id number]   cc:   [name], M.D.  [place]  [place] [address]  Fax: [phone]

## 2010-08-23 NOTE — Op Note (Signed)
[place]. [facility]  Patient:    [name], [name] Visit Number: [id number] MRN: [id number]          Service Type: DSU Location: [id number] Attending Physician:  Charlette Caffey Dictated by:   Garey Ham, M.D. Proc. Date: 07/16/01 Admit Date:  07/16/2001 Discharge Date: 07/17/2001                             Operative Report  PREOPERATIVE DIAGNOSIS: Advanced proliferative diabetic retinopathy, chronic and recurrent vitreous hemorrhage, left eye.  POSTOPERATIVE DIAGNOSIS: Advanced proliferative diabetic retinopathy, chronic and recurrent vitreous hemorrhage, left eye.  OPERATION/PROCEDURE: Pars plans vitrectomy using vitreous infusion suction cutter, Endo laser panretinal photocoagulation, and membrane peeling and excision.  SURGEON: Garey Ham, M.D.  ASSISTANT: Nurse.  ANESTHESIA: General.  OPHTHALMOSCOPY: As previously described.  OPERATIVE PROCEDURE: After the patient was prepped and draped a speculum was inserted in the left eye.  A small corneal abrasion occurred when the speculum was inserted and reinserted.  A superior rectus traction suture was placed.  A peritomy was performed adjacent to the limbus from the 9:30 oclock to four oclock position.  The subconjunctival tissue was cleaned and three sclerotomy sites prepared using the MDR blade.  The 4 mm vitreous infusion tunneler was secured in place at the four oclock position with a 5-0 white Mersilene mattress suture.  The fiberoptic light pipe was inserted at the two oclock position and the handpiece of the vitreous infusion suction cutter at the 10 oclock position.  Slow vitreous infusion suction cutting was begun from an anterior to posterior direction.  There was considerable vitreous inferior hemorrhage attached to the thickened posterior vitreous membranes.  The posterior chamber attachments were carefully severed.  Some scleral depression was used during  the procedure to bring the peripheral vitreous hemorrhage into view centrally.  As the retina was approached there appeared to be extensive proliferative diabetic retinopathy with neovascularization covering the optic nerve and along the superior temporal retinal blood vessel.  Using the [facility] pick and the vitreous scissors these membranes were peeled and elevated and then excised from the retinal surface.  There was a minimal amount of bleeding at the optic nerve and along the superior temporal blood vessel.  The Endo laser photocoagulator was then assembled and 0000000 applications were made using a panretinal scouter technique.  Toward the end there were some focal applications applied in the paramacular area to microaneurysms.  Good burns were achieved.  Indirect ophthalmoscopy was performed, revealing no peripheral retinal detachment or retinal tearing.  It was then elected to close.  The vitreous instruments were withdrawn and the sclerotomy sites closed with 7-0 interrupted Vicryl sutures.  The conjunctiva was pulled forward and closed with a running 7-0 Vicryl suture.  Depo Garamycin and dexamethasone were injected in the subtenon space inferiorly. Maxitrol with Atropine ointment were instilled in the conjunctival cul-de-sac and a light patch and protective shield applied.  Duration of procedure and anesthesia administration, one to 1-1/2 hours.  Patient tolerated the procedure well in general and left the operating room for the recovery room in good condition.  The patient is being transferred to the recovery room and subsequently to the 5700 diabetic eye observation unit. Dictated by:   Garey Ham, M.D. Attending Physician:  Charlette Caffey DD:  07/16/01 TD:  07/16/01 Job: [id number]

## 2010-08-23 NOTE — Op Note (Signed)
[place]. [facility]  Patient:    [name], [name]                     MRN: [id number] Proc. Date: 07/23/00 Adm. Date:  [id number] Disc. Date: [id number] Attending:  Nolon Bussing                           Operative Report  PREOPERATIVE DIAGNOSIS:  Tractional detachment right eye.  Vitreous hemorrhage right eye.  Progressive proliferative diabetic retinopathy right eye.  POSTOPERATIVE DIAGNOSIS:  Tractional detachment right eye.  Vitreous hemorrhage right eye.  Progressive proliferative diabetic retinopathy right eye.  PROCEDURE:  Posterior vitrectomy with membrane peel.  Endolaser panretinal photocoagulation OD.  SURGEON:  Nolon Bussing, M.D.  ANESTHESIA:  General endotracheal anesthesia.  INDICATIONS:  The patient is a 55 year old woman with threatened vision loss on the basis of tractional retinal detachment along the superotemporal vascular arcades and macular region and the inferotemporal vascular arcades secondary to progressive proliferative diabetic retinopathy right eye with preretinal dense vitreous hemorrhage.  This is an attempt to relieve the medial opacity, release the traction, and induce crisis of retinopthy to preserve vision as well as down to the globe. The patient understands the risks of anesthesia including the rare occurrence of death, but also to the eye including hemorrhage, infection, scarring, need for further surgery, no change in vision, loss of vision, and progression of disease despite intervention.  Appropriate signed consent was obtained.  DESCRIPTION OF PROCEDURE:  The patient was taken to the operating room.  In the operating room general endotracheal anesthesia was instituted without difficulty.  The right periocular region was sterilely prepped and draped in the usual ophthalmic fashion.  Lid speculum applied.  Conjunctival peritomy fashioned temporally and superonasally.  4 mm infusion was then secured 3.5  mm posterior to the limbus in the inferotemporal quadrant.  Placement in the vitreous cavity verified visually.  Superior sclerotomy was then fashion. Wild microscope placed in position with Biome attachment.  Core vitrectomy was then begun.  Iatrogenic posterior retraction was necessary creating modified en block dissection of fibrovascular tissues overlying the macular region. These were lysed as well as attachment to the optic nerve and the vitreous hyloidal face was then removed from the posterior segment anterior to the equivalent of 360 degrees and vitreous space was then trimmed.  At this time endolaser photocoagulation was then carried out in areas of nontreatment as well as extending peripherally.  Hemostasis was spontaneous. At this time superior sclerotomies were then closed after the instruments had been removed.  Infusion was removed and closed with 7-0 Vicryl suture. Conjunctiva closed with 7-0 Vicryl suture.  Subconjunctival injections of antibiotics and steroids applied.  The patient tolerated the procedure well without complications. DD:  09/07/00 TD:  09/07/00 Job: [id number]

## 2010-08-23 NOTE — Op Note (Signed)
NAME:  [name], [name]                        ACCOUNT NO.:  [id number]   MEDICAL RECORD NO.:  [id number]                   PATIENT TYPE:  OIB   LOCATION:  2899                                 FACILITY:  [place]   PHYSICIAN:  Garey Ham, M.D.             DATE OF BIRTH:  07-20-1955   DATE OF PROCEDURE:  07/26/2002  DATE OF DISCHARGE:                                 OPERATIVE REPORT   PREOPERATIVE DIAGNOSIS:  Posterior subcapsular cataract, right eye.   POSTOPERATIVE DIAGNOSIS:  Posterior subcapsular cataract, right eye.   OPERATION:  Planned extracapsular cataract extraction, phacoemulsification,  primary insertion of posterior chamber intraocular lens implant, right eye.   SURGEON:  Garey Ham, M.D.   ASSISTANT:  Nurse.   ANESTHESIA:  Local 4% Xylocaine, 0.75% Marcaine retrobulbar block without  Wydase, topical tetracaine, intraocular Xylocaine.  Anesthesia standby  required in this diabetic patient.  The patient given sodium Pentothal  intravenously during the period of retrobulbar injection.   DESCRIPTION OF PROCEDURE:  After the patient was prepped and draped, a lid  speculum was inserted into the right eye.  Schiotz tonometry was recorded at  5 scale units with a 5.5 g weight, indicating a normal pressure.  A superior  rectus traction suture was placed.  A peritomy was performed adjacent to the  limbus from the 11 to the 1 o'clock position.  The corneal-scleral junction  was cleaned and a corneal-scleral groove made with a 45-degree super blade.  The anterior chamber was then entered with the 2.5 mm diamond keratome at  the 12 o'clock position, and the 15-degree blade at the 2:30 position.  Using a bent #26 gauge needle on a Healon syringe, a circular capsular  rhexis was begun, and then completed with the Grabow forceps.  Hydrodissection and hydrodelineation were performed using 1% Xylocaine.  The  30-degree phacoemulsification tip was then inserted into the  rather soft  nucleus and the nucleus aspirated.  The total ultrasonic time was one minute  20 seconds, average power level 10%.  The total amount of fluid used was 75  mL.  Following the removal of the nucleus, the residual cortex was aspirated  with the irrigation aspiration tip.  The posterior subcapsular plaque was  aspirated with some caution, as the posterior capsule appeared floppy and  aspirated into the port of the irrigation aspiration tip.  It was then  elected to insert an [employer] [id number] silicone three-piece  posterior chamber intraocular lens implant, diopter strength +20.00.  This  was inserted with the McDonald forceps into the anterior chamber and then  centered into the capsular bag using the [facility] lens rotator.  The lens  appeared to be well-centered.  The Healon which had been used throughout the  procedure was aspirated and replaced with balanced salt solution and Miochol  ophthalmic solution.  The operative incisions appeared to be self-sealing;  however, in this patient  on Coumadin it was elected to place two radial #10-  0 nylon sutures in the 12 incision.  Maxitrol ointment was instilled in the  conjunctival cul-de-sac and a light patch and protector shield applied.  The  duration of the procedure and anesthesia administration was 45  minutes.  The patient tolerated the procedure well in general and left the operating  room for the recovery room in good condition.                                                 Garey Ham, M.D.    HNJ/MEDQ  D:  07/26/2002  T:  07/26/2002  Job:  [id number]

## 2010-08-23 NOTE — Op Note (Signed)
[place]. [facility]  Patient:    [name], [name] Visit Number: [id number] MRN: [id number]          Service Type: DSU Location: [phone] Attending Physician:  Charlette Caffey Proc. 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  PROCEDURE:  Pars plana vitrectomy using vitreous infusion suction cutter, membrane peeling, and excision endolaser photocoagulation.  SURGEON:  Garey Ham, M.D.  ASSISTANT:  Nurse.  ANESTHESIA:  General anesthesia.  OPHTHALMOSCOPY:  No preoperative view due to dense vitreous hemorrhage.  DESCRIPTION OF PROCEDURE:  After the patient was prepped and draped a lid speculum was inserted in the right eye.  The eye was turned downward and a superior rectus traction suture placed.  A peritomy was performed adjacent to the limbus from the 8 to 2 oclock position. Three sclerotomy sites were prepared at the 10, 2, and 8 oclock positions, 3.5 mm from the limbus using the MVR blade.  The 4 mm vitreous infusion terminal was secured in place at the 8 oclock position with a 5-0 white mattress Dacron suture.  The tip could be seen projecting into the vitreous cavity.  The fiberoptic light pipe was inserted at the 2 oclock position and the handpiece of the vitreous infusion suction cutter at the 10 oclock position.  Slow vitreous infusion suction cutting were begun from an anterior to posterior direction.  Dense vitreous hemorrhage was encountered posterior to the lens itself and in the anterior pars plana area.  Gradually, the vitreous became clearer and the underlying retina seen with a proliferative stalk extending at the optic  nerve nasally along the retinal surface.  Temporally there was a pool of preretinal blood. Using the Dork brush-tip aspiratory and the handpiece of the vitreous infusion suction cutter, the surface retinal hemorrhage was gradually absorbed.  Some scleral depression was associated with removal of the peripheral vitreous hemorrhage.  It was then elected to assemble the endolaser photocoagulator and approximately 0000000 additional applications applied to areas where there appeared to be slight gap areas.  Using the vitreous scissors and the Michaels pick, the proliferative stalk at the optic nerve was elevated from the retinal surface and gradually excised or avulsed from the optic nerve surface.  There was some transient bleeding from the optic nerve surface. Care during the endolaser photocoagulation was applied to the area of the previous proliferative site on the nasal side of the optic nerve.  Indirect ophthalmoscopy was performed with scleral depression and no peripheral retinal tear or detachment areas seen.  It was then elected to close.  The vitreous instruments were withdrawn and the sclerotomy sites closed with 7-0 interrupted Vicryl sutures.  The conjunctiva was pulled forward and closed with a running 7-0 Vicryl suture.  Depo-Dexamethasone and Geramycin were injected in the subtenon space inferiorly.  Maxitrol ointment and Atropine ointment were instilled in the conjunctival cul-de-sac and a light patch and protector shield applied.  Duration of the procedure and anesthesia administration 1 to 1-1/2 hours.  The patient tolerated the procedure well in general and left the operating room for the recovery room in good condition. ttending Physician:  Ishmael Holter,  Bobetta Lime DD:  01/19/01 TD:  01/19/01 Job: [id number]

## 2010-08-23 NOTE — H&P (Signed)
[place]. [facility]  Patient:    SHAWNELL, [name] Visit Number: [id number] MRN: [id number]          Service Type: DSU Location: [phone] Attending Physician:  Charlette Caffey Admit Date:  01/19/2001                           History and Physical  HISTORY OF PRESENT ILLNESS:  This was a planned surgical readmission of this 55 year old non-insulin-dependent diabetic admitted for additional vitreoretinal surgery of the right eye.  This patient has a long history of insulin-dependent diabetes mellitus and secondary complications of proliferative diabetic retinopathy with chronic and recurrent vitreous hemorrhage.  The patient was previously admitted by Nolon Bussing, M.D. on November 18, 2000, for tractional retinal detachment of the right eye with vitreous hemorrhage and progressive proliferative retinopathy. A posterior vitrectomy with membrane peel and endolaser panretinal photocoagulation was performed.  The patient did well initially and later developed recurrent vitreous hemorrhage.  The patient was referred to my office where this diagnosis was confirmed.  The patient was observed, but the vitreous hemorrhage appeared to wax and wane and visual acuity was variable from finger counting to 50 to 20 over 60.  Since the patient was experiencing recurrent hemorrhages, it was elected to proceed with a second posterior vitrectomy at this time.  The patient signed an informed consent and arrangements were made for her outpatient admission.  PAST MEDICAL HISTORY:  The patient is under the care of Orpah Melter, M.D.. The patient is felt to be in stable general condition.  Blood sugar was approximately 155 on the morning of surgery.  The patient has been taking Glucophage and Amaryl under Dr. Joella Prince care.  REVIEW OF SYSTEMS:  No cardiorespiratory complaints.  PHYSICAL EXAMINATION:  GENERAL:  The patient is a well-nourished, obese white  female in no acute distress with decreased vision in the right eye.  HEENT:  Eyes; visual acuity last recorded at less than 20/200 (finger counting) on December 02, 2000.  Applanation tonometry 23 mm right eye, 14 mm left eye.  External ocular and slit lamp examination normal.  Detailed fundus examination showed dense vitreous hemorrhage with no vitreous details present. B-scan ultrasonography showed no retinal detachment present.  CHEST:  Lungs clear to auscultation and percussion.  HEART:  Normal sinus rhythm, no cardiomegaly, no murmurs.  ABDOMEN:  Negative.  EXTREMITIES:  Negative.  ADMISSION DIAGNOSIS:  Advanced proliferative diabetic retinopathy with chronic and recurrent vitreous hemorrhage, right eye.  PLAN:  Posterior vitrectomy through pars plana with additional membrane peeling and endolaser photocoagulation as anticipated under general anesthesia. Attending Physician:  Charlette Caffey DD:  01/19/01 TD:  01/19/01 Job: [id number]

## 2010-08-23 NOTE — H&P (Signed)
NAME:  [name], [name]                        ACCOUNT NO.:  [id number]   MEDICAL RECORD NO.:  [id number]                   PATIENT TYPE:  OIB   LOCATION:  2899                                 FACILITY:  [place]   PHYSICIAN:  Garey Ham, M.D.             DATE OF BIRTH:  1955/07/09   DATE OF ADMISSION:  07/26/2002  DATE OF DISCHARGE:                                HISTORY & PHYSICAL   CHIEF COMPLAINT:  This was a planned outpatient readmission of this 55-year-  old white female diabetic, non-insulin-dependent type, admitted for a  cataract implant surgery of the right eye.   HISTORY OF PRESENT ILLNESS:  This patient has a long history of non-insulin-  dependent diabetes mellitus and secondary complications of proliferative  diabetic retinopathy, recurrent vitreous hemorrhage, and cataract formation.  She has undergone previous vitrectomy surgery of the right eye in April  2002, and repeat vitrectomy surgery in January 19, 2001.  In April 2002, a  similar vitrectomy procedure was performed on the left eye.  These were  associated with pan-retinal laser photocoagulation.  Recently the patient  has developed posterior subcapsular cataracts, greater in the right than  left eye, reducing her vision to 20/70, with marked glare symptoms.  The  patient stated she was having difficulty with dim or blurred vision,  difficulty reading and driving.  She signed an informed consent.  The  patient has been on Coumadin, and the risk of possible bleeding during the  procedure was performed.  Her regular physician, however, Dr. [name],  felt that the patient should not stop her Coumadin, due to her chronic  problem of recurrent pulmonary emboli.   REVIEW OF SYSTEMS:  No current cardiorespiratory complaints.   PHYSICAL EXAMINATION:  VITAL SIGNS:  As recorded on admission, blood  pressure 191/92, pulse 59, respirations 16, temperature 97.1 degrees.  GENERAL:  The patient is a pleasant  well-nourished, well-developed  overweight white female, in no acute distress.  HEENT:  Eyes:  Visual acuity as noted above 20/70 right eye, 20/60 left eye.  Slit lamp examination revealed that the eyes are white and clear with  posterior subcapsular cataract formation in both eyes.  A detailed fundus  examination reveals a clear vitreous, attached retina, with extensive pan-  retinal laser photocoagulation.  The blood vessels reveal inactive old  proliferative diabetic retinopathy.  The optic nerve is sharply outlined but  slightly pale in color with a disc:cup ratio of 0.4.  CHEST:  Lungs clear to percussion and auscultation.  HEART:  Normal sinus rhythm.  No cardiomegaly, no murmurs.  ABDOMEN:  Negative.  EXTREMITIES:  Negative.   ADMISSION DIAGNOSES:  1. Posterior subcapsular cataract, both eyes.  2. Diabetic proliferative retinopathy, inactive, both eyes.  3.     Non-insulin-dependent diabetes mellitus.  4. Anticoagulation for recurrent pulmonary emboli.   SURGICAL PLAN:  Cataract implant surgery under local anesthesia, right eye  now  and left eye later.                                                Garey Ham, M.D.    HNJ/MEDQ  D:  07/26/2002  T:  07/26/2002  Job:  [id number]

## 2010-08-23 NOTE — Consult Note (Signed)
NAME:  [name], [name]                        ACCOUNT NO.:  [id number]   MEDICAL RECORD NO.:  [id number]                   PATIENT TYPE:  INP   LOCATION:  5735                                 FACILITY:  [facility]   PHYSICIAN:  Clinton D. Annamaria Boots, M.D.              DATE OF BIRTH:  06/30/1955   DATE OF CONSULTATION:  05/17/2002  DATE OF DISCHARGE:                                   CONSULTATION   REASON FOR CONSULTATION:  Pulmonary embolism, lung nodule.   HISTORY OF PRESENT ILLNESS:  This 55 year old white female nonsmoker, was  seen at the request of Dr. Thomasenia Bottoms, related to her diagnosis of  pulmonary embolism with abnormal CT scan.  She describes what she thought  was a cold in December and staying tired after that with increased cough.  She had been working with a [employer] exercise machine and walking a lot, but  with this cold, felt tired and stopped exercising.  For three weeks she had  a significant cough which got gradually better, but she stayed tired.  In  the past week she had progressive awareness of exhaustion and dyspnea on  exertion that limited her ability to climb steps to work.  She was still  coughing some.  She stays exhausted and felt all of this was quite unusual  for her.  She vomited once three days before admission and presented to the  emergency room with the primary complaints of shortness of breath and  weakness.  Initial evaluation suggested pneumonia.  She had had a past  history of deep vein thrombosis, and a Doppler leg vein evaluation showed a  thrombus of uncertain age in the greater saphenous vein on the right.  Otherwise without deep vein thrombosis.  A CT scan of the chest was  done  showing bilateral pulmonary emboli and also a pleural-based nodule in the  left posterior lung base.  She had never had a prior CT.  Chest x-rays had  shown pulmonary artery enlargement and had questioned interstitial  infiltrative pneumonia, but none of these studies  showed pleural effusion or  lung mass.  She has now been started on anticoagulation and is being  coumadinized.  Her pre-hospital cough was bad enough that plans for cataract  surgery were put on hold.   REVIEW OF SYSTEMS:  Weight stable, always heavy.  A recent cough without  hemoptysis.  Scant sputum.  No pleuritic chest pain, bleeding, fever, or  chills.  Legs have been swelling a little ever since she says she started  the pills for diabetes.   PAST MEDICAL/SURGICAL HISTORY:  1. Type 2 diabetes mellitus, non-insulin-dependent.  2. Previous laser surgery for retinopathy required when she had bleeding     while on Coumadin for previous deep vein thrombosis.  3. Hypertension.  4. Cholecystectomy.  5. C-section.   MEDICATIONS AT HOME:  1. Norvasc.  2. Lasix.  3. Glucophage.  4. Amaryl.  5. Actos.  6. Remote use of birth control pills, off of hormones since menopause at age     40.   SOCIAL HISTORY:  Her job involved prolonged sitting in front of a computer  and using a telephone.  Because of her prior deep vein thrombosis, she made  an effort to be up and moving around several times each day.  At home she  describes herself as quite active doing home repairs, mowing the lawn, etc.  She lives with her son who smokes.  She herself smoked only a few cigarettes  occasionally many years ago.  No alcohol.   FAMILY HISTORY:  An uncle died with throat cancer, a smoker.  Otherwise  nobody in the family with clotting disorder, including pulmonary embolism,  deep vein thrombosis, cerebrovascular accident, or myocardial infarction to  her knowledge.  Nobody else with cancer.   ALLERGIES:  Intolerance to CODEINE.   ADDITIONAL COMMENTS:  No exposure to tuberculosis.  Has always lived in the  [place].  Never had any diagnosed lung disease.   LABORATORY DATA:  In addition to above, sputum growing normal flora.  ABG  initially on 4 L prongs:  pH of 7.5, pCO2 of 31, pO2 of  88, bicarbonate 28.  Initial white blood cell count 7500, hemoglobin 12.8, platelet count normal.  Chemistries unremarkable for creatinine, total protein and liver enzymes.  Urinalysis shows large hemoglobin and proteinuria.  Echocardiogram:  Estimated right ventricular systolic pressure elevated at  62 with mild dilatation.  Otherwise signs of right ventricular overload not  described.  Left ventricular systolic function in the lower limits of normal  with an ejection fraction of 50%-55%.  Mild mitral valve regurgitation  noted.  Electrocardiogram showed pulmonary disease pattern, left anterior fascicular  block, moderate voltage for left ventricular hypertrophy with nonspecific ST-  T wave changes, consider lateral ischemia.   PHYSICAL EXAMINATION:  VITAL SIGNS:  Temperature 97.4 degrees, pulse regular  at 66, blood pressure 172/86, oxygen saturation 94% on 2 L.  GENERAL:  Obese, pleasant, articulate woman, breathing comfortably lying  supine in bed, wearing nasal oxygen.  HEENT:  Conjunctivae clear.  NECK:  Neck veins not distended.  No stridor.  NODES:  No adenopathy at the neck, shoulders, or axillae.  CHEST:  Clear anteriorly with a few crackles in the right dependent base.  HEART:  A regular rhythm.  I could not hear a murmur, although one is  described in the admission history.  No gallop or rub.  ABDOMEN:  Obese, nontender.  No abdominal organomegaly detected.  EXTREMITIES:  Heavy legs with stasis hypopigmentation bilaterally.  Pretibial edema six inches above each ankle.   IMPRESSION:  Bilateral pulmonary emboli with a history of recurrent deep  vein thrombosis.   RECOMMENDATIONS:  1. She should be on relatively prolonged anticoagulation.  I would favor at     least six months.  2. At some point she should have laboratory evaluation for risk factors for     deep vein thrombosis, including factor V Leiden, lupus anticoagulant,    antiphospholipid antibody, etc.  This  information should be utilized in     counseling her and her family.  3. She needs long-term counseling on prevention of stasis, elevation of     legs, avoidance of tight clothes, etc, as well as counseling on the risk     issues of anticoagulant therapy.  4. There is a small possibility the lung nodule detected  on the CT scan     could be a cancer, although she has not been a smoker and is not at     obvious risk.  The differential for this includes the possibility that it     is an old infarction scar, previous pneumonia scar, incidental     atelectasis, or potentially a cancer.  5. Recognizing that a cancer should not be ignored for the planned duration     of anticoagulant therapy and actually might contribute to the risks for     pathologic coagulation, we need to make a compromise in terms of     evaluation.  I would suggest anticoagulating her with Coumadin for one     month.  After that I would repeat a limited CT scan to see whether there     has been a change in the left basilar lesion.  If it is shrinking, we can     just watch it, but if it is stable or growing, I would then take her off     of anticoagulation long enough to get a needle biopsy of the nodule.     Perhaps her cataract excision could be coordinated to take advantage of     the same window off of Coumadin.  I would also repeat a Doppler study of     her leg veins at that time, to ensure that she has not redeveloped a clot     in the legs, before taking her off of anticoagulation.  If there is any     question, or if she were to need surgical resection of the lung mass,     then a vena cava filter should be placed.  Please call me if I can be of     assistance.                                               Clinton D. Annamaria Boots, M.D.    CDY/MEDQ  D:  05/17/2002  T:  05/17/2002  Job:  [id number]   cc:   Jeneen Montgomery, M.D.

## 2010-08-23 NOTE — Discharge Summary (Signed)
NAME:  [name], [name]                        ACCOUNT NO.:  [id number]   MEDICAL RECORD NO.:  [id number]                   PATIENT TYPE:  INP   LOCATION:  [id number]                                 FACILITY:  [facility]   PHYSICIAN:  [name], M.D.                   DATE OF BIRTH:  1955-12-06   DATE OF ADMISSION:  05/14/2002  DATE OF DISCHARGE:  05/23/2002                                 DISCHARGE SUMMARY   DISCHARGE DIAGNOSES:  1. Bilateral pulmonary emboli confirmed with CT of the chest.  2. Diabetes mellitus, type 2, complicated by retinopathy with previous     retinal bleed.  3. Hypertension.  4. Previous history of deep vein thrombosis, treated as an outpatient with     Lovenox, complicated by retinal bleed.  5. Status post cholecystectomy.  6. Status post cesarean section.   BRIEF MEDICAL HISTORY/HOSPITAL COURSE:  This 55 year old white female  developed cough with productive sputum and was essentially treated in the  outpatient setting with antibiotics and then presented to the emergency room  with increasing shortness of breath.  Chest x-ray at the time of admission  suggested that the patient had a probable left upper lobe pneumonia,  however, repeat chest x-ray on the next hospital day showed clearing of this  lesion.  As the patient remained significantly short of breath, a spiral CT  of the chest was ordered to rule out PE and this was confirmed.  Her D-  dimers were also positive and the patient did have a previous history of  DVT.  Venous Dopplers, on this admission, failed to show any significant  venous thrombosis and hence the exact site of the embolic phenomenon is  unclear.  The patient was started on full IV heparin as her CT scan  suggested a spiculated pleural-based mass 1.2-1.3 cm in the right lung base.  A pulmonary consult was requested and the patient was seen by Dr. Annamaria Boots who  advised the patient to continue with the anticoagulations for six months and  repeat CT scan for followup on the lung nodule noted on CT scan.  The  patient would have to be off anticoagulant for needle biopsy.   The patient's blood sugar were slightly elevated after she was taken off her  Glucophage post IV contrast, but subsequent blood sugar levels after  restarting the Glucophage, along with her Amaryl and Actos showed good  control, as she has been in the outpatient setting.  The patient was  gradually mobilized in the office.  Her only other medical problem included  mild thrombocytopenia which did not worsen with continuation of the heparin.  She also had a hemoglobin of 9.8 and iron levels and B12 and folate were  normal suggesting anemia of chronic disease or anemia secondary to her  diabetes.  The patient's sputum culture showed no significant abnormality  and urinalysis was normal.  An echocardiogram  done during this  hospitalization showed no valvular abnormalities as the patient was noted to  have soft ejection systolic murmur on admission.  Her overall LV function  appeared normal with range between 50-55%.  There was a right ventricular  mild dilatation noted, probably secondary to her pulmonary emboli and  estimated RV systolic pressure was 62 mm.   DISCHARGE MEDICATIONS:  The patient is being discharged to home on:  1. Furosemide 20 mg p.o. daily.  2. Amaryl 4 mg p.o. daily.  3. Actos 45 mg p.o. daily.  4. Glucophage 1000 mg p.o. b.i.d.  5. Diovan 160 mg p.o. daily.  6. Coumadin 10 mg p.o. daily.   DISCHARGE INSTRUCTIONS:  The patient is to continue on her diabetic diet and  Coumadin.  She will be followed up in the office on May 26, 2002, with  a repeat PT and INR and advised to rest at home.  Her O2 during this  admission has been discontinued.                                               [name], M.D.    RM/MEDQ  D:  05/23/2002  T:  05/23/2002  Job:  [id number]

## 2010-08-23 NOTE — Discharge Summary (Signed)
[name], [name]              ACCOUNT NO.:  [id number]   MEDICAL RECORD NO.:  [id number]          PATIENT TYPE:  INP   LOCATION:  5702                         FACILITY:  [facility]   PHYSICIAN:  Vladimir Faster, MD        DATE OF BIRTH:  10/07/1955   DATE OF ADMISSION:  04/05/2006  DATE OF DISCHARGE:  04/10/2006                               DISCHARGE SUMMARY   PRIMARY CARE PHYSICIAN:  Dr. [name].   DISCHARGE DIAGNOSES:  1. Staphylococcus pneumonia.  2. Staphylococcus bacteremia.  3. Hypertension.  4. Diabetes mellitus.  5. Hypokalemia, which is replaced.   DISCHARGE MEDICATIONS:  1. Doxycycline 100 mg twice daily for 6 more days.  2. Lantus 20 units at bedtime.  3. Lasix 80 mg twice daily.  4. Potassium chloride 20 mEq twice daily.  5. Aspirin 81 mg daily.  6. Actos 15 mg once daily.  7. Amaryl 8 mg once daily.  8. Metformin 1000 mg twice daily.  9. Clonidine 0.1 mg twice daily.  10.Benicar 40 mg a day.  11.Crestor 10 mg daily.   HISTORY OF PRESENT ILLNESS:  In short, [name] is a 55 year old lady  who came into the ER with complaints of fever, chills, chest pain and  shortness of breath.  On physical exam, she was found to have wheezes  and her x-ray of her chest showed a right lower lobe pneumonia.  She was  admitted to the hospital for further management.   PROBLEM LIST:  1. Community acquired staphylococcus pneumonia.  She was initially      started on Rocephin and Zithromax and her fever did come down in      the next couple of days.  She has symptomatically improved and her      breathing is now back to her baseline.  She had blood cultures      positive for methicillin sensitive staphylococcus aureus and      initially she was changed to vancomycin, which was then streamlined      to doxycycline.  The plan is to treat her for 10 days of      antibiotics.  2. Methicillin sensitive staphylococcus aureus bacteremia.  She had 1      out of 2 blood cultures  positive for methicillin sensitive      staphylococcus aureus.  This staphylococcus aureus was very      sensitive to doxycycline.  Initially, she was changed to vancomycin      before the sensitive test came back, now she is streamlined to      doxycycline and she will complete a 10 day course of antibiotics.  3. Uncontrolled hypertension.  During the course of hospital stay, her      blood pressure was on the higher side, running from 0000000      systolic.  She was started on clonidine 0.1 mg twice daily, along      with her current medications.  4. Diabetes mellitus.  She was continued on the same regimen and her      blood sugars were under good  control in the hospital.  5. Hyperlipidemia.  She was continued on Crestor.   DISPOSITION:  She will be discharged home in stable condition.   FOLLOWUP:  She will follow up with her primary care doctor, Dr. [name], in 1-2 weeks.   She has been given prescriptions for doxycycline, potassium chloride and  clonidine.      Vladimir Faster, MD  Electronically Signed     PKN/MEDQ  D:  04/10/2006  T:  04/10/2006  Job:  [id number]   cc:   [name], M.D.

## 2011-05-27 ENCOUNTER — Other Ambulatory Visit (HOSPITAL_COMMUNITY): Payer: Self-pay | Admitting: Internal Medicine

## 2011-05-27 DIAGNOSIS — Z1231 Encounter for screening mammogram for malignant neoplasm of breast: Secondary | ICD-10-CM

## 2011-06-19 ENCOUNTER — Ambulatory Visit (HOSPITAL_COMMUNITY)
Admission: RE | Admit: 2011-06-19 | Discharge: 2011-06-19 | Disposition: A | Discharge status: Home / Self Care | Payer: Medicaid Other | Source: Ambulatory Visit | Attending: Internal Medicine | Admitting: Internal Medicine

## 2011-06-19 DIAGNOSIS — Z1231 Encounter for screening mammogram for malignant neoplasm of breast: Secondary | ICD-10-CM

## 2011-08-13 ENCOUNTER — Inpatient Hospital Stay (HOSPITAL_COMMUNITY)
Admission: EM | Admit: 2011-08-13 | Discharge: 2011-09-04 | DRG: 313 | Disposition: A | Discharge status: Home / Self Care | Payer: Medicaid Other | Source: Ambulatory Visit | Attending: Cardiovascular Disease | Admitting: Cardiovascular Disease

## 2011-08-13 ENCOUNTER — Encounter (HOSPITAL_COMMUNITY): Payer: Self-pay | Admitting: *Deleted

## 2011-08-13 ENCOUNTER — Emergency Department (HOSPITAL_COMMUNITY): Payer: Medicaid Other

## 2011-08-13 DIAGNOSIS — K922 Gastrointestinal hemorrhage, unspecified: Secondary | ICD-10-CM | POA: Diagnosis not present

## 2011-08-13 DIAGNOSIS — R7989 Other specified abnormal findings of blood chemistry: Secondary | ICD-10-CM | POA: Diagnosis present

## 2011-08-13 DIAGNOSIS — Y847 Blood-sampling as the cause of abnormal reaction of the patient, or of later complication, without mention of misadventure at the time of the procedure: Secondary | ICD-10-CM | POA: Diagnosis not present

## 2011-08-13 DIAGNOSIS — D61818 Other pancytopenia: Secondary | ICD-10-CM | POA: Diagnosis present

## 2011-08-13 DIAGNOSIS — I272 Pulmonary hypertension, unspecified: Secondary | ICD-10-CM | POA: Diagnosis present

## 2011-08-13 DIAGNOSIS — I209 Angina pectoris, unspecified: Secondary | ICD-10-CM | POA: Diagnosis present

## 2011-08-13 DIAGNOSIS — I200 Unstable angina: Secondary | ICD-10-CM

## 2011-08-13 DIAGNOSIS — F17200 Nicotine dependence, unspecified, uncomplicated: Secondary | ICD-10-CM | POA: Diagnosis present

## 2011-08-13 DIAGNOSIS — D500 Iron deficiency anemia secondary to blood loss (chronic): Secondary | ICD-10-CM | POA: Diagnosis not present

## 2011-08-13 DIAGNOSIS — I447 Left bundle-branch block, unspecified: Secondary | ICD-10-CM | POA: Diagnosis present

## 2011-08-13 DIAGNOSIS — N390 Urinary tract infection, site not specified: Secondary | ICD-10-CM | POA: Diagnosis not present

## 2011-08-13 DIAGNOSIS — I10 Essential (primary) hypertension: Secondary | ICD-10-CM | POA: Diagnosis present

## 2011-08-13 DIAGNOSIS — I498 Other specified cardiac arrhythmias: Secondary | ICD-10-CM | POA: Diagnosis present

## 2011-08-13 DIAGNOSIS — I5030 Unspecified diastolic (congestive) heart failure: Secondary | ICD-10-CM | POA: Diagnosis present

## 2011-08-13 DIAGNOSIS — I509 Heart failure, unspecified: Secondary | ICD-10-CM | POA: Diagnosis present

## 2011-08-13 DIAGNOSIS — K625 Hemorrhage of anus and rectum: Secondary | ICD-10-CM | POA: Diagnosis present

## 2011-08-13 DIAGNOSIS — I82409 Acute embolism and thrombosis of unspecified deep veins of unspecified lower extremity: Secondary | ICD-10-CM | POA: Diagnosis not present

## 2011-08-13 DIAGNOSIS — I129 Hypertensive chronic kidney disease with stage 1 through stage 4 chronic kidney disease, or unspecified chronic kidney disease: Secondary | ICD-10-CM | POA: Diagnosis present

## 2011-08-13 DIAGNOSIS — Z95828 Presence of other vascular implants and grafts: Secondary | ICD-10-CM

## 2011-08-13 DIAGNOSIS — T45515A Adverse effect of anticoagulants, initial encounter: Secondary | ICD-10-CM

## 2011-08-13 DIAGNOSIS — Z794 Long term (current) use of insulin: Secondary | ICD-10-CM

## 2011-08-13 DIAGNOSIS — I2789 Other specified pulmonary heart diseases: Secondary | ICD-10-CM | POA: Diagnosis present

## 2011-08-13 DIAGNOSIS — R609 Edema, unspecified: Secondary | ICD-10-CM | POA: Diagnosis present

## 2011-08-13 DIAGNOSIS — IMO0001 Reserved for inherently not codable concepts without codable children: Secondary | ICD-10-CM

## 2011-08-13 DIAGNOSIS — N184 Chronic kidney disease, stage 4 (severe): Secondary | ICD-10-CM | POA: Diagnosis present

## 2011-08-13 DIAGNOSIS — I5189 Other ill-defined heart diseases: Secondary | ICD-10-CM | POA: Diagnosis present

## 2011-08-13 DIAGNOSIS — E785 Hyperlipidemia, unspecified: Secondary | ICD-10-CM | POA: Diagnosis present

## 2011-08-13 DIAGNOSIS — Z86718 Personal history of other venous thrombosis and embolism: Secondary | ICD-10-CM

## 2011-08-13 DIAGNOSIS — Y9210 Unspecified residential institution as the place of occurrence of the external cause: Secondary | ICD-10-CM | POA: Diagnosis not present

## 2011-08-13 DIAGNOSIS — Z7901 Long term (current) use of anticoagulants: Secondary | ICD-10-CM

## 2011-08-13 DIAGNOSIS — R001 Bradycardia, unspecified: Secondary | ICD-10-CM | POA: Diagnosis present

## 2011-08-13 DIAGNOSIS — S60219A Contusion of unspecified wrist, initial encounter: Secondary | ICD-10-CM | POA: Diagnosis not present

## 2011-08-13 DIAGNOSIS — Z86711 Personal history of pulmonary embolism: Secondary | ICD-10-CM

## 2011-08-13 DIAGNOSIS — E11319 Type 2 diabetes mellitus with unspecified diabetic retinopathy without macular edema: Secondary | ICD-10-CM | POA: Diagnosis present

## 2011-08-13 DIAGNOSIS — D696 Thrombocytopenia, unspecified: Secondary | ICD-10-CM | POA: Diagnosis not present

## 2011-08-13 DIAGNOSIS — R0789 Other chest pain: Principal | ICD-10-CM | POA: Diagnosis present

## 2011-08-13 DIAGNOSIS — E1139 Type 2 diabetes mellitus with other diabetic ophthalmic complication: Secondary | ICD-10-CM | POA: Diagnosis present

## 2011-08-13 DIAGNOSIS — D649 Anemia, unspecified: Secondary | ICD-10-CM | POA: Diagnosis present

## 2011-08-13 DIAGNOSIS — Z9109 Other allergy status, other than to drugs and biological substances: Secondary | ICD-10-CM

## 2011-08-13 DIAGNOSIS — R0602 Shortness of breath: Secondary | ICD-10-CM | POA: Diagnosis present

## 2011-08-13 HISTORY — DX: Major depressive disorder, single episode, unspecified: F32.9

## 2011-08-13 HISTORY — DX: Acute embolism and thrombosis of unspecified deep veins of unspecified lower extremity: I82.409

## 2011-08-13 HISTORY — DX: Heart failure, unspecified: I50.9

## 2011-08-13 HISTORY — DX: Depression, unspecified: F32.A

## 2011-08-13 HISTORY — DX: Other pulmonary embolism without acute cor pulmonale: I26.99

## 2011-08-13 HISTORY — DX: Unspecified osteoarthritis, unspecified site: M19.90

## 2011-08-13 HISTORY — DX: Essential (primary) hypertension: I10

## 2011-08-13 LAB — DIFFERENTIAL
Basophils Absolute: 0 10*3/uL (ref 0.0–0.1)
Lymphocytes Relative: 11 % — ABNORMAL LOW (ref 12–46)
Lymphs Abs: 0.9 10*3/uL (ref 0.7–4.0)
Monocytes Absolute: 0.3 10*3/uL (ref 0.1–1.0)
Monocytes Relative: 3 % (ref 3–12)
Neutro Abs: 7 10*3/uL (ref 1.7–7.7)

## 2011-08-13 LAB — OCCULT BLOOD, POC DEVICE: Fecal Occult Bld: NEGATIVE

## 2011-08-13 LAB — BASIC METABOLIC PANEL
CO2: 25 mEq/L (ref 19–32)
Chloride: 99 mEq/L (ref 96–112)
Creatinine, Ser: 2.32 mg/dL — ABNORMAL HIGH (ref 0.50–1.10)

## 2011-08-13 LAB — CBC
HCT: 31.6 % — ABNORMAL LOW (ref 36.0–46.0)
Hemoglobin: 10.4 g/dL — ABNORMAL LOW (ref 12.0–15.0)
RBC: 3.32 MIL/uL — ABNORMAL LOW (ref 3.87–5.11)
WBC: 8.4 10*3/uL (ref 4.0–10.5)

## 2011-08-13 LAB — GLUCOSE, CAPILLARY
Glucose-Capillary: 158 mg/dL — ABNORMAL HIGH (ref 70–99)
Glucose-Capillary: 112 mg/dL — ABNORMAL HIGH (ref 70–99)

## 2011-08-13 LAB — D-DIMER, QUANTITATIVE: D-Dimer, Quant: 11.17 ug/mL-FEU — ABNORMAL HIGH (ref 0.00–0.48)

## 2011-08-13 LAB — CARDIAC PANEL(CRET KIN+CKTOT+MB+TROPI): CK, MB: 3.6 ng/mL (ref 0.3–4.0)

## 2011-08-13 LAB — TROPONIN I: Troponin I: <0.3 ng/mL (ref <0.30)

## 2011-08-13 MED ORDER — NITROGLYCERIN IN D5W 200-5 MCG/ML-% IV SOLN
2.0000 ug/min | Freq: Once | INTRAVENOUS | Status: AC
  Administered 2011-08-13: 5 ug/min via INTRAVENOUS
  Filled 2011-08-13: qty 250

## 2011-08-13 MED ORDER — CITALOPRAM HYDROBROMIDE 20 MG PO TABS
20.0000 mg | ORAL_TABLET | Freq: Every day | ORAL | Status: DC
  Administered 2011-08-13 – 2011-09-03 (×22): 20 mg via ORAL
  Filled 2011-08-13 (×23): qty 1

## 2011-08-13 MED ORDER — SODIUM CHLORIDE 0.9 % IV BOLUS (SEPSIS)
500.0000 mL | Freq: Once | INTRAVENOUS | Status: AC
  Administered 2011-08-13: 1000 mL via INTRAVENOUS

## 2011-08-13 MED ORDER — SODIUM CHLORIDE 0.9 % IV SOLN
INTRAVENOUS | Status: AC
  Administered 2011-08-13 – 2011-08-14 (×2): via INTRAVENOUS

## 2011-08-13 MED ORDER — NITROGLYCERIN IN D5W 200-5 MCG/ML-% IV SOLN
3.0000 ug/min | INTRAVENOUS | Status: DC
  Administered 2011-08-13: 5 ug/min via INTRAVENOUS

## 2011-08-13 MED ORDER — HEPARIN (PORCINE) IN NACL 100-0.45 UNIT/ML-% IJ SOLN
1200.0000 [IU]/h | INTRAMUSCULAR | Status: DC
  Administered 2011-08-14 – 2011-08-15 (×2): 1200 [IU]/h via INTRAVENOUS
  Filled 2011-08-13 (×5): qty 250

## 2011-08-13 MED ORDER — HYDROCODONE-ACETAMINOPHEN 5-325 MG PO TABS
1.0000 | ORAL_TABLET | Freq: Four times a day (QID) | ORAL | Status: DC | PRN
  Administered 2011-08-15 – 2011-08-24 (×7): 1 via ORAL
  Filled 2011-08-13 (×4): qty 1
  Filled 2011-08-13: qty 2
  Filled 2011-08-13 (×3): qty 1

## 2011-08-13 MED ORDER — OMEGA-3-ACID ETHYL ESTERS 1 G PO CAPS
1.0000 g | ORAL_CAPSULE | Freq: Two times a day (BID) | ORAL | Status: DC
  Administered 2011-08-13 – 2011-09-04 (×44): 1 g via ORAL
  Filled 2011-08-13 (×45): qty 1

## 2011-08-13 MED ORDER — ASPIRIN EC 81 MG PO TBEC
81.0000 mg | DELAYED_RELEASE_TABLET | Freq: Every day | ORAL | Status: DC
  Administered 2011-08-14 – 2011-09-04 (×22): 81 mg via ORAL
  Filled 2011-08-13 (×22): qty 1

## 2011-08-13 MED ORDER — HEPARIN BOLUS VIA INFUSION
4000.0000 [IU] | Freq: Once | INTRAVENOUS | Status: AC
  Administered 2011-08-13: 4000 [IU] via INTRAVENOUS

## 2011-08-13 MED ORDER — ACETAMINOPHEN 325 MG PO TABS
650.0000 mg | ORAL_TABLET | ORAL | Status: DC | PRN
  Administered 2011-08-22 – 2011-08-26 (×3): 650 mg via ORAL
  Filled 2011-08-13 (×3): qty 2

## 2011-08-13 MED ORDER — ONDANSETRON HCL 4 MG/2ML IJ SOLN
4.0000 mg | Freq: Four times a day (QID) | INTRAMUSCULAR | Status: DC | PRN
  Administered 2011-08-20 – 2011-08-21 (×2): 4 mg via INTRAVENOUS
  Filled 2011-08-13 (×2): qty 2

## 2011-08-13 MED ORDER — DIAZEPAM 5 MG PO TABS
5.0000 mg | ORAL_TABLET | Freq: Every evening | ORAL | Status: DC | PRN
  Administered 2011-08-17 – 2011-08-21 (×3): 5 mg via ORAL
  Filled 2011-08-13 (×3): qty 1

## 2011-08-13 MED ORDER — INSULIN GLARGINE 100 UNIT/ML ~~LOC~~ SOLN
18.0000 [IU] | Freq: Every day | SUBCUTANEOUS | Status: DC
  Administered 2011-08-13: 18 [IU] via SUBCUTANEOUS

## 2011-08-13 MED ORDER — INSULIN ASPART 100 UNIT/ML ~~LOC~~ SOLN: 0.0000 [IU] | Freq: Three times a day (TID) | SUBCUTANEOUS | Status: DC

## 2011-08-13 MED ORDER — ASPIRIN 325 MG PO TABS
325.0000 mg | ORAL_TABLET | Freq: Once | ORAL | Status: AC
  Administered 2011-08-13: 325 mg via ORAL
  Filled 2011-08-13: qty 1

## 2011-08-13 MED ORDER — ONDANSETRON HCL 4 MG/2ML IJ SOLN
INTRAMUSCULAR | Status: AC
  Administered 2011-08-13: 14:00:00
  Filled 2011-08-13: qty 2

## 2011-08-13 MED ORDER — SODIUM CHLORIDE 0.9 % IV SOLN: INTRAVENOUS | Status: DC

## 2011-08-13 MED ORDER — LEVOTHYROXINE SODIUM 25 MCG PO TABS
25.0000 ug | ORAL_TABLET | Freq: Every day | ORAL | Status: DC
  Administered 2011-08-14: 25 ug via ORAL
  Filled 2011-08-13 (×2): qty 1

## 2011-08-13 MED ORDER — METOPROLOL TARTRATE 25 MG PO TABS
25.0000 mg | ORAL_TABLET | Freq: Two times a day (BID) | ORAL | Status: DC
  Administered 2011-08-16 – 2011-08-20 (×7): 25 mg via ORAL
  Filled 2011-08-13 (×14): qty 1

## 2011-08-13 NOTE — ED Notes (Signed)
edmd made aware pt started to bleed last time she was on coumadin

## 2011-08-13 NOTE — ED Provider Notes (Signed)
History     CSN: [id number]  Arrival date & time 08/13/11  1345   First MD Initiated Contact with Patient 08/13/11 [phone]      Chief Complaint  Patient presents with  . Shortness of Breath    The history is provided by the patient.   the patient reports that today she went to walk her dog she develops significant shortness of breath with discomfort in her neck and bilateral jaws.  She reports pain across her shoulder blades.  It made her nauseated.  She denies palpitations or syncope.  She has a history of diabetes hypertension hyperlipidemia.  She does not smoke cigarettes.  There is no family history of early heart disease. she has a has had DVT and pulmonary embolism currently has a filter in place because she developed "bleeding in her eyes".  She has no prior history of coronary artery disease.  She reports she's had 2 stress tests with the last one was several years ago.  She had an admission for diastolic heart failure several years ago was evaluated by the [facility] in heart and vascular team.  Her cardiologist she reports is Dr. Alvester Chou.   Past Medical History  Diagnosis Date  . CHF (congestive heart failure)   . Diabetes mellitus   . Arthritis   . Hypertension   . Depression     Past Surgical History  Procedure Date  . Eye surgery   . Cholecystectomy   . Cesarean section   . Ivc     ivc filter    No family history on file.  History  Substance Use Topics  . Smoking status: Passive Smoker  . Smokeless tobacco: Not on file  . Alcohol Use: No    OB History    Grav Para Term Preterm Abortions TAB SAB Ect Mult Living                  Review of Systems  All other systems reviewed and are negative.    Allergies  Morphine and related  Home Medications   Current Outpatient Rx  Name Route Sig Dispense Refill  . ASPIRIN EC 81 MG PO TBEC Oral Take 81 mg by mouth daily.    Marland Kitchen CITALOPRAM HYDROBROMIDE 20 MG PO TABS Oral Take 20 mg by mouth at bedtime.    Marland Kitchen DIAZEPAM  5 MG PO TABS Oral Take 5 mg by mouth at bedtime as needed. For muscle spasms.    . FUROSEMIDE 80 MG PO TABS Oral Take 80 mg by mouth 2 (two) times daily.    Marland Kitchen GLIMEPIRIDE 4 MG PO TABS Oral Take 4 mg by mouth daily before breakfast.    . HYDROCODONE-ACETAMINOPHEN 5-500 MG PO TABS Oral Take 1 tablet by mouth every 8 (eight) hours as needed. For pain.    . INSULIN GLARGINE 100 UNIT/ML [place] SOLN Subcutaneous Inject 17-21 Units into the skin See admin instructions. She uses a sliding scale.    Marland Kitchen LEVOTHYROXINE SODIUM 25 MCG PO TABS Oral Take 25 mcg by mouth daily.    Marland Kitchen METFORMIN HCL 1000 MG PO TABS Oral Take 1,000 mg by mouth 2 (two) times daily with a meal.    . METOPROLOL TARTRATE 50 MG PO TABS Oral Take 50 mg by mouth 2 (two) times daily.    . ADULT MULTIVITAMIN W/MINERALS CH Oral Take 1 tablet by mouth daily.    Marland Kitchen OLMESARTAN MEDOXOMIL 20 MG PO TABS Oral Take 20 mg by mouth daily.    Marland Kitchen  FISH OIL 1200 MG PO CAPS Oral Take 1 capsule by mouth 2 (two) times daily.    Marland Kitchen OVER THE COUNTER MEDICATION Oral Take 3 tablets by mouth 2 (two) times daily. Raspberry Ketones.    Marland Kitchen OVER THE COUNTER MEDICATION Oral Take 2 tablets by mouth daily. Cherry 1000mg  tablets.    . SPIRONOLACTONE 25 MG PO TABS Oral Take 25 mg by mouth daily.      BP 132/45  Pulse 60  Temp(Src) 98.7 F (37.1 C) (Oral)  Resp 16  Ht 5\' 3"  (1.6 m)  Wt 276 lb (125.193 kg)  BMI 48.89 kg/m2  SpO2 100%  Physical Exam  Nursing note and vitals reviewed. Constitutional: She is oriented to person, place, and time. She appears well-developed and well-nourished. No distress.  HENT:  Head: Normocephalic and atraumatic.  Eyes: EOM are normal.  Neck: Normal range of motion.  Cardiovascular: Normal rate, regular rhythm and normal heart sounds.   Pulmonary/Chest: Effort normal and breath sounds normal.  Abdominal: Soft. She exhibits no distension. There is no tenderness.  Musculoskeletal: Normal range of motion.  Neurological: She is alert and  oriented to person, place, and time.  Skin: Skin is warm and dry.  Psychiatric: She has a normal mood and affect. Judgment normal.    ED Course  Procedures   CRITICAL CARE Performed by: Hoy Morn Total critical care time: 30 Critical care time was exclusive of separately billable procedures and treating other patients. Critical care was necessary to treat or prevent imminent or life-threatening deterioration. Critical care was time spent personally by me on the following activities: development of treatment plan with patient and/or surrogate as well as nursing, discussions with consultants, evaluation of patient's response to treatment, examination of patient, obtaining history from patient or surrogate, ordering and performing treatments and interventions, ordering and review of laboratory studies, ordering and review of radiographic studies, pulse oximetry and re-evaluation of patient's condition.    Date: 08/13/2011  Rate: 57  Rhythm: normal sinus rhythm  QRS Axis: normal  Intervals: Left bundle branch block  ST/T Wave abnormalities: Left bundle branch block with ST depression T wave inversions in the lateral leads  Conduction Disutrbances: none  Narrative Interpretation:   Old EKG Reviewed: Change from prior EKG in 2011     Labs Reviewed  CBC - Abnormal; Notable for the following:    RBC 3.32 (*)    Hemoglobin 10.4 (*)    HCT 31.6 (*)    Platelets 148 (*)    All other components within normal limits  DIFFERENTIAL - Abnormal; Notable for the following:    Neutrophils Relative 83 (*)    Lymphocytes Relative 11 (*)    All other components within normal limits  BASIC METABOLIC PANEL - Abnormal; Notable for the following:    Glucose, Bld 200 (*)    BUN 69 (*)    Creatinine, Ser 2.32 (*)    GFR calc non Af Amer 23 (*)    GFR calc Af Amer 26 (*)    All other components within normal limits  TROPONIN I  PROTIME-INR  OCCULT BLOOD, POC DEVICE   Dg Chest 2  View  08/13/2011  *RADIOLOGY REPORT*  Clinical Data:  Near-syncope, chest pressure, shortness of breath, cough, hypertension, diabetes, CHF  CHEST - 2 VIEW  Comparison: 09/30/2009  Findings: Enlargement of cardiac silhouette. Mediastinal contours and pulmonary vascularity normal. Minimal atelectasis right base. No acute infiltrate, pleural effusion or pneumothorax. No acute osseous findings.  IMPRESSION: Enlargement  of cardiac silhouette. Minimal right base atelectasis.  Original Report Authenticated By: Burnetta Sabin, M.D.     1. Unstable angina       MDM  Patient's history is concerning for unstable angina.  At this time she still having discomfort in her jaw she was started on heparin and nitroglycerin.  She appears to have ST depression T-wave inversions in her lateral leads as compared to an EKG in June of 2011.  I spoke with the team from [facility] in heart and vascular will by with the patient emergency department for dimension.  The patient reported blood in her stool last week.  Today her stool color is normal brown and Hemoccult negative.  I suspect this may have been hemorrhoidal bleed.  Heparin and nitroglycerin now.        Hoy Morn, MD 08/14/11 [phone]

## 2011-08-13 NOTE — ED Notes (Signed)
Pt is resting. Alert and oriented. Awaiting edmd

## 2011-08-13 NOTE — Progress Notes (Signed)
ANTICOAGULATION CONSULT NOTE - Initial Consult  Pharmacy Consult for: IV Heparin Indication: ACS/STEMI  Allergies  Allergen Reactions  . Morphine And Related Rash    Patient Measurements:   Heparin Dosing Weight: 87.2 kg  Vital Signs: Temp: 98.7 F (37.1 C) (05/08 1557) Temp src: Oral (05/08 1557) BP: 132/45 mmHg (05/08 1557) Pulse Rate: 60  (05/08 1557)  Labs:  Basename 08/13/11 1500  HGB 10.4*  HCT 31.6*  PLT 148*  APTT --  LABPROT 13.8  INR 1.04  HEPARINUNFRC --  CREATININE 2.32*  CKTOTAL --  CKMB --  TROPONINI <0.30    CrCl is unknown because there is no height on file for the current visit.   Medical History: Past Medical History  Diagnosis Date  . CHF (congestive heart failure)   . Diabetes mellitus   . Arthritis   . Hypertension   . Depression     Medications:  Scheduled:    . aspirin  325 mg Oral Once  . nitroGLYCERIN  2-200 mcg/min Intravenous Once  . ondansetron      . sodium chloride  500 mL Intravenous Once   Infusions:    . sodium chloride     PRN:   Assessment:  56 yo obese female starting IV heparin for ACS/STEMI  Actual weight reported as 125.2 kg; Heparin dosing weight = 87.2 kg  ARF with Scr elevated to 2.32, CrCL 35 ml/min  Baseline PT/INR WNL  CBC okay   Goal of Therapy:  Heparin level 0.3-0.7 units/ml Monitor platelets by anticoagulation protocol: Yes   Plan:  1.) Heparin bolus 4000 units x 1 2.) Heparin gtt 1050 units/hr 3.) Daily Heparin level/cbc 4.) Heparin level 6 hours after start of gtt  Isiaah Cuervo, Gaye Alken PharmD 4:15 PM 08/13/2011

## 2011-08-13 NOTE — ED Notes (Signed)
Attempt to call report with out success, RN will return call

## 2011-08-13 NOTE — ED Provider Notes (Signed)
She experienced near syncope when walking her dog this morning. It did not improve with rest. No associated chest pain. She had nausea without vomiting. Had rectal bleeding recently. She has a history of pulmonary embolus. Vitals normal. Screening evaluation ordered.  Richarda Blade, MD 08/13/11 [phone]

## 2011-08-13 NOTE — ED Notes (Signed)
edmd notified pt has not been seen. md is going to assess pt

## 2011-08-13 NOTE — ED Notes (Signed)
Per ems: pt is from home. Pt went outside to walk the dog and started to feel as if she was going to pass out. Sob with excertion. 12 lead left bundle branch block.

## 2011-08-13 NOTE — H&P (Signed)
[name] is an 56 y.o. female.   Chief Complaint: SOB/Chest pain. HPI:    The patient is a 56 year old morbidly obese Caucasian female with a history of pulmonary embolism in 2004, recurrent DVT, status post IVC filter placement, grade 2 diastolic heart failure, pulmonary hypertension, insulin-dependent diabetes mellitus, diabetic retinopathy, anemia, cholecystectomy, hypertension, hyperlipidemia. Patient had echocardiogram in June 2011 showed an ejection fraction of 60-65% she grade 2 diastolic dysfunction moderate pulmonary hypertension and a peak PA pressure of 58 mmHg.  She reports that she had a nuclear stress test approximately 2 years ago results of which are unknown.  She presents with after being tired for the last 2 days. States she's been short of breath became quite severe when she was out walking her dog today. She also complains of some chest pressure with radiation to her jaw and across the shoulders. She also reports diaphoresis, nausea, cough, lower extremity edema, increasing weight of 2 pounds in last 2 days. She also reports a lot of blood in her stool for the last 2-3 days but no melena. She had abdominal pain in the epigastric region yesterday after eating with excessive belching was 5/10 in intensity but is resolved.  She denies vomiting, fever, orthopnea, congestion, dysuria, hematuria.  Chest x-ray shows enlargement of cardiac silhouette with minimal right base atelectasis.  Her creatinine is elevated 2.32 and BUN of 69 this is elevated from last year when her creatinine was 1.3.  She's had 2 negative troponin so far while in the emergency room.  EKG shows intraventricular conduction delay sinus bradycardia with T wave inversion in leads 5 and 6. She is currently pain-free on IV heparin and nitroglycerin.  Past Medical History  Diagnosis Date  . CHF (congestive heart failure)   . Diabetes mellitus   . Arthritis   . Hypertension   . Depression     Past Surgical History   Procedure Date  . Eye surgery   . Cholecystectomy   . Cesarean section   . Ivc     ivc filter    No family history on file. Social History:  reports that she has been passively smoking.  She does not have any smokeless tobacco history on file. She reports that she does not drink alcohol or use illicit drugs.  Allergies:  Allergies  Allergen Reactions  . Morphine And Related Rash     (Not in a hospital admission)  Results for orders placed during the hospital encounter of 08/13/11 (from the past 48 hour(s))  CBC     Status: Abnormal   Collection Time   08/13/11  3:00 PM      Component Value Range Comment   WBC 8.4  4.0 - 10.5 (K/uL)    RBC 3.32 (*) 3.87 - 5.11 (MIL/uL)    Hemoglobin 10.4 (*) 12.0 - 15.0 (g/dL)    HCT 31.6 (*) 36.0 - 46.0 (%)    MCV 95.2  78.0 - 100.0 (fL)    MCH 31.3  26.0 - 34.0 (pg)    MCHC 32.9  30.0 - 36.0 (g/dL)    RDW 13.4  11.5 - 15.5 (%)    Platelets 148 (*) 150 - 400 (K/uL)   DIFFERENTIAL     Status: Abnormal   Collection Time   08/13/11  3:00 PM      Component Value Range Comment   Neutrophils Relative 83 (*) 43 - 77 (%)    Neutro Abs 7.0  1.7 - 7.7 (K/uL)  Lymphocytes Relative 11 (*) 12 - 46 (%)    Lymphs Abs 0.9  0.7 - 4.0 (K/uL)    Monocytes Relative 3  3 - 12 (%)    Monocytes Absolute 0.3  0.1 - 1.0 (K/uL)    Eosinophils Relative 3  0 - 5 (%)    Eosinophils Absolute 0.2  0.0 - 0.7 (K/uL)    Basophils Relative 0  0 - 1 (%)    Basophils Absolute 0.0  0.0 - 0.1 (K/uL)   BASIC METABOLIC PANEL     Status: Abnormal   Collection Time   08/13/11  3:00 PM      Component Value Range Comment   Sodium 137  135 - 145 (mEq/L)    Potassium 4.9  3.5 - 5.1 (mEq/L)    Chloride 99  96 - 112 (mEq/L)    CO2 25  19 - 32 (mEq/L)    Glucose, Bld 200 (*) 70 - 99 (mg/dL)    BUN 69 (*) 6 - 23 (mg/dL)    Creatinine, Ser 2.32 (*) 0.50 - 1.10 (mg/dL)    Calcium 9.1  8.4 - 10.5 (mg/dL)    GFR calc non Af Amer 23 (*) >90 (mL/min)    GFR calc Af Amer 26 (*) >90  (mL/min)   TROPONIN I     Status: Normal   Collection Time   08/13/11  3:00 PM      Component Value Range Comment   Troponin I <0.30  <0.30 (ng/mL)   PROTIME-INR     Status: Normal   Collection Time   08/13/11  3:00 PM      Component Value Range Comment   Prothrombin Time 13.8  11.6 - 15.2 (seconds)    INR 1.04  0.00 - 1.49    OCCULT BLOOD, POC DEVICE     Status: Normal   Collection Time   08/13/11  4:04 PM      Component Value Range Comment   Fecal Occult Bld NEGATIVE     GLUCOSE, CAPILLARY     Status: Abnormal   Collection Time   08/13/11  5:28 PM      Component Value Range Comment   Glucose-Capillary 158 (*) 70 - 99 (mg/dL)    Dg Chest 2 View  08/13/2011  *RADIOLOGY REPORT*  Clinical Data:  Near-syncope, chest pressure, shortness of breath, cough, hypertension, diabetes, CHF  CHEST - 2 VIEW  Comparison: 09/30/2009  Findings: Enlargement of cardiac silhouette. Mediastinal contours and pulmonary vascularity normal. Minimal atelectasis right base. No acute infiltrate, pleural effusion or pneumothorax. No acute osseous findings.  IMPRESSION: Enlargement of cardiac silhouette. Minimal right base atelectasis.  Original Report Authenticated By: Burnetta Sabin, M.D.    Review of Systems  Constitutional: Positive for malaise/fatigue and diaphoresis. Negative for fever.  HENT: Positive for sore throat. Negative for congestion.   Eyes: Negative for blurred vision.  Respiratory: Positive for cough and shortness of breath.   Cardiovascular: Positive for chest pain, leg swelling and PND. Negative for orthopnea and claudication.  Gastrointestinal: Positive for nausea, abdominal pain (yesterday currently resolved) and blood in stool (2-3 days bright red blood). Negative for vomiting and melena.  Genitourinary: Negative for dysuria and hematuria.  Musculoskeletal:       She reports jaw pain and pain across her shoulders at the same time she was having chest pain  Neurological: Positive for dizziness.     Blood pressure 113/46, pulse 51, temperature 98.7 F (37.1 C), temperature source Oral, resp. rate  14, height 5\' 3"  (1.6 m), weight 125.193 kg (276 lb), SpO2 100.00%. Physical Exam  Constitutional: She is oriented to person, place, and time. No distress.       Morbidly obese Caucasian female  HENT:  Head: Normocephalic and atraumatic.  Eyes: EOM are normal. Pupils are equal, round, and reactive to light. No scleral icterus.  Neck: Normal range of motion.  Cardiovascular: Regular rhythm.  Bradycardia present.   Murmur heard.  Systolic murmur is present with a grade of 1/6  Pulses:      Radial pulses are 2+ on the right side, and 2+ on the left side.       Posterior tibial pulses are 1+ on the right side, and 2+ on the left side.       No carotid bruits  Respiratory: Effort normal and breath sounds normal. She has no wheezes. She has no rales.  GI: Soft. Bowel sounds are normal. There is no tenderness.  Musculoskeletal: She exhibits edema.       1 + lower extremity edema on the left  Neurological: She is alert and oriented to person, place, and time.  Skin: Skin is warm and dry.  Psychiatric: She has a normal mood and affect.     Assessment/Plan Patient Active Hospital Problem List: Chest pain (08/13/2011) Shortness of breath (08/13/2011) Sinus bradycardia (08/13/2011) Insulin dependent diabetes mellitus (08/13/2011) Morbid obesity (08/13/2011) HTN (hypertension) (08/13/2011) HLD (hyperlipidemia) (08/13/2011) Anemia (08/13/2011) History of pulmonary embolism (08/13/2011) Presence of IVC filter (08/13/2011) Pulmonary HTN, Moderate  PA pressure 15mmHG 09/2009 (08/13/2011)  Plan: The patient admitted to [facility] a step down unit. Will continue IV heparin and IV nitroglycerin. We will gently hydrate the patient in hopes of improving her creatinine. We'll continue to cycle cardiac enzymes. Will recheck EKG in the morning. We'll also check a d-dimer, lipid panel, TSH, A1c. We'll schedule nuclear  stress test and get lower extremity Dopplers.  We'll decrease her beta blocker due to bradycardia as well as hold Benicar, furosemide, Aldactone.  Continue to monitor blood pressure and heart rate    HAGER,BRYAN W 08/13/2011, 6:05 PM  I have seen and examined the patient in the emergency room along with Brett Canales, PA.  I have reviewed the chart, notes and new data.  I agree with PA's note.  Primary concern is re: recurrent PE in a patient with IVC filter but not on anticoagulation therapy. Also some symptoms suggest ACS and ECG is very abnormal (mostly IVCD - old or new?). Imaging w/u will be delayed by acute on chronic renal failure in a patient with diabetic nephropathy. Also wary of anemia and recent hematochezia (although this sounds like hemorrhoidal bleeding).  PLAN: IV heparin. Echo [employer]. V/Q scan (after myoview to avoid confounding radiation) Aspirin. Get old ecg. Admit stepdown.  Sanda Klein, MD, [place] [phone] 08/14/2011, 8:12 AM

## 2011-08-13 NOTE — ED Notes (Signed)
Pt. Had repeat EKG

## 2011-08-13 NOTE — ED Notes (Signed)
Pt states she was walking her dogs and became sob. Pt denies any chest, arm, jaw, and back pain. Pt denies any n/v. Pt states she has noticed blood in her stool. Pt is alert and orient but does have a history of dvt's

## 2011-08-13 NOTE — ED Notes (Signed)
Report to [facility] 

## 2011-08-13 NOTE — ED Notes (Signed)
Cardiologist in room with pt

## 2011-08-14 ENCOUNTER — Inpatient Hospital Stay (HOSPITAL_COMMUNITY): Payer: Medicaid Other

## 2011-08-14 DIAGNOSIS — I447 Left bundle-branch block, unspecified: Secondary | ICD-10-CM | POA: Diagnosis present

## 2011-08-14 DIAGNOSIS — N184 Chronic kidney disease, stage 4 (severe): Secondary | ICD-10-CM | POA: Diagnosis present

## 2011-08-14 DIAGNOSIS — R0602 Shortness of breath: Secondary | ICD-10-CM

## 2011-08-14 DIAGNOSIS — R7989 Other specified abnormal findings of blood chemistry: Secondary | ICD-10-CM | POA: Diagnosis present

## 2011-08-14 DIAGNOSIS — I5189 Other ill-defined heart diseases: Secondary | ICD-10-CM | POA: Diagnosis present

## 2011-08-14 LAB — CARDIAC PANEL(CRET KIN+CKTOT+MB+TROPI)
CK, MB: 3.4 ng/mL (ref 0.3–4.0)
CK, MB: 3 ng/mL (ref 0.3–4.0)
Total CK: 106 U/L (ref 7–177)
Troponin I: <0.3 ng/mL (ref <0.30)

## 2011-08-14 LAB — HEMOGLOBIN A1C
Hgb A1c MFr Bld: 6.4 % — ABNORMAL HIGH (ref <5.7)
Mean Plasma Glucose: 137 mg/dL — ABNORMAL HIGH (ref <117)

## 2011-08-14 LAB — BASIC METABOLIC PANEL
CO2: 25 mEq/L (ref 19–32)
Glucose, Bld: 92 mg/dL (ref 70–99)
Potassium: 4.6 mEq/L (ref 3.5–5.1)
Sodium: 140 mEq/L (ref 135–145)

## 2011-08-14 LAB — HEPARIN LEVEL (UNFRACTIONATED): Heparin Unfractionated: 0.39 IU/mL (ref 0.30–0.70)

## 2011-08-14 LAB — PROTIME-INR: INR: 1.09 (ref 0.00–1.49)

## 2011-08-14 LAB — LIPID PANEL
Cholesterol: 149 mg/dL (ref 0–200)
VLDL: 30 mg/dL (ref 0–40)

## 2011-08-14 MED ORDER — INSULIN GLARGINE 100 UNIT/ML ~~LOC~~ SOLN
9.0000 [IU] | Freq: Every day | SUBCUTANEOUS | Status: DC
  Administered 2011-08-14 – 2011-08-28 (×15): 9 [IU] via SUBCUTANEOUS

## 2011-08-14 MED ORDER — INSULIN ASPART 100 UNIT/ML ~~LOC~~ SOLN
0.0000 [IU] | Freq: Three times a day (TID) | SUBCUTANEOUS | Status: DC
  Administered 2011-08-14 (×2): 1 [IU] via SUBCUTANEOUS
  Administered 2011-08-15: 2 [IU] via SUBCUTANEOUS
  Administered 2011-08-15 – 2011-08-16 (×2): 1 [IU] via SUBCUTANEOUS
  Administered 2011-08-16 – 2011-08-19 (×5): 2 [IU] via SUBCUTANEOUS
  Administered 2011-08-19 (×2): 1 [IU] via SUBCUTANEOUS
  Administered 2011-08-20: 3 [IU] via SUBCUTANEOUS
  Administered 2011-08-20 – 2011-08-21 (×2): 2 [IU] via SUBCUTANEOUS
  Administered 2011-08-21: 3 [IU] via SUBCUTANEOUS
  Administered 2011-08-21 – 2011-08-22 (×2): 2 [IU] via SUBCUTANEOUS
  Administered 2011-08-22: 3 [IU] via SUBCUTANEOUS
  Administered 2011-08-22: 2 [IU] via SUBCUTANEOUS
  Administered 2011-08-23 (×2): 3 [IU] via SUBCUTANEOUS
  Administered 2011-08-23: 2 [IU] via SUBCUTANEOUS
  Administered 2011-08-24 (×3): 3 [IU] via SUBCUTANEOUS
  Administered 2011-08-25 (×2): 2 [IU] via SUBCUTANEOUS
  Administered 2011-08-25: 3 [IU] via SUBCUTANEOUS
  Administered 2011-08-26: 2 [IU] via SUBCUTANEOUS
  Administered 2011-08-26: 3 [IU] via SUBCUTANEOUS
  Administered 2011-08-26: 2 [IU] via SUBCUTANEOUS
  Administered 2011-08-27: 3 [IU] via SUBCUTANEOUS
  Administered 2011-08-27: 2 [IU] via SUBCUTANEOUS
  Administered 2011-08-27: 3 [IU] via SUBCUTANEOUS
  Administered 2011-08-28 (×3): 2 [IU] via SUBCUTANEOUS
  Administered 2011-08-29 (×2): 3 [IU] via SUBCUTANEOUS
  Administered 2011-08-29: 18:00:00 via SUBCUTANEOUS
  Administered 2011-08-30 (×2): 2 [IU] via SUBCUTANEOUS
  Administered 2011-08-30: 3 [IU] via SUBCUTANEOUS
  Administered 2011-08-31: 1 [IU] via SUBCUTANEOUS
  Administered 2011-08-31 (×2): 3 [IU] via SUBCUTANEOUS
  Administered 2011-09-01 – 2011-09-03 (×7): 2 [IU] via SUBCUTANEOUS
  Administered 2011-09-03: 1 [IU] via SUBCUTANEOUS
  Administered 2011-09-03: 3 [IU] via SUBCUTANEOUS
  Administered 2011-09-04: 2 [IU] via SUBCUTANEOUS
  Administered 2011-09-04: 3 [IU] via SUBCUTANEOUS

## 2011-08-14 MED ORDER — TECHNETIUM TO 99M ALBUMIN AGGREGATED
3.0000 | Freq: Once | INTRAVENOUS | Status: AC | PRN
  Administered 2011-08-14: 3 via INTRAVENOUS

## 2011-08-14 MED ORDER — PANTOPRAZOLE SODIUM 40 MG PO TBEC
40.0000 mg | DELAYED_RELEASE_TABLET | Freq: Every day | ORAL | Status: DC
  Administered 2011-08-14 – 2011-09-04 (×21): 40 mg via ORAL
  Filled 2011-08-14 (×19): qty 1

## 2011-08-14 MED ORDER — SODIUM CHLORIDE 0.9 % IV SOLN
INTRAVENOUS | Status: DC
  Administered 2011-08-14: 11:00:00 via INTRAVENOUS
  Administered 2011-08-15: 75 mL/h via INTRAVENOUS
  Administered 2011-08-15: 21:00:00 via INTRAVENOUS
  Administered 2011-08-16: 75 mL/h via INTRAVENOUS

## 2011-08-14 MED ORDER — LEVOTHYROXINE SODIUM 50 MCG PO TABS
50.0000 ug | ORAL_TABLET | Freq: Every day | ORAL | Status: DC
  Administered 2011-08-15 – 2011-09-04 (×20): 50 ug via ORAL
  Filled 2011-08-14 (×23): qty 1

## 2011-08-14 NOTE — Progress Notes (Signed)
ANTICOAGULATION CONSULT NOTE - Follow Up Consult  Pharmacy Consult for heparin Indication: chest pain/ACS and ?PE  Vital Signs: Temp: 98.4 F (36.9 C) (05/09 1200) Temp src: Oral (05/09 1200) BP: 121/54 mmHg (05/09 1200) Pulse Rate: 53  (05/09 1200)  Labs:  Basename 08/14/11 1227 08/14/11 0938 08/14/11 0300 08/13/11 2129 08/13/11 1500  HGB -- -- -- -- 10.4*  HCT -- -- -- -- 31.6*  PLT -- -- -- -- 148*  APTT -- -- -- -- --  LABPROT -- -- 14.3 -- 13.8  INR -- -- 1.09 -- 1.04  HEPARINUNFRC 0.39 -- 0.29* -- --  CREATININE -- -- 2.32* -- 2.32*  CKTOTAL -- 106 118 148 --  CKMB -- 3.0 3.4 3.6 --  TROPONINI -- <0.30 <0.30 <0.30 --    Estimated Creatinine Clearance: 36 ml/min (by C-G formula based on Cr of 2.32).  Medications:  Infusions:    . sodium chloride 75 mL/hr at 08/14/11 0900  . sodium chloride 75 mL/hr at 08/14/11 1105  . heparin 1,200 Units/hr (08/14/11 1252)  . DISCONTD: sodium chloride    . DISCONTD: nitroGLYCERIN 5 mcg/min (08/14/11 0701)   Assessment: 66 yof started on heparin for chest pain but now thought to possibly have a PE. Pt does have a history of DVT and PE. Heparin level is therapeutic at 0.39. No bleeding noted. Plan for VQ scan.   Goal of Therapy:  Heparin level 0.3-0.7 units/ml Monitor platelets by anticoagulation protocol: Yes   Plan:  1. Continue heparin at 1200units/hr 2. F/u VQ scan results 3. F/u plan for oral anticoagulation if needed  Andros Channing, Rande Lawman 08/14/2011,1:43 PM

## 2011-08-14 NOTE — Progress Notes (Signed)
  CRITICAL VALUE ALERT  Critical value received:CBG 49 Date of notification:  08/14/11 Time of notification: 816  Nurse who received alert:B Barnaby Rippeon RN  MD notified (1st page): Kerin Ransom Time of first page: 0820  Pt  Blood Sugar of 49 gave patient orange juice and Blood sugar came up to 62. Pt eat breakfast and retook blood sugar and it was 125. Pt resting in bed. Vitals stable and pt never reported any symptoms. Will continue to monitor.   Saul Dorsi, Mervin Kung RN

## 2011-08-14 NOTE — Progress Notes (Signed)
[place] for: IV Heparin Indication: ACS/STEMI  Allergies  Allergen Reactions  . Morphine And Related Rash    Patient Measurements: Height: 5\' 3"  (160 cm) Weight: 285 lb 4.4 oz (129.4 kg) IBW/kg (Calculated) : 52.4  Heparin Dosing Weight: 87.2 kg  Vital Signs: Temp: 97.7 F (36.5 C) (05/09 0317) Temp src: Oral (05/09 0317) BP: 109/50 mmHg (05/09 0300) Pulse Rate: 51  (05/08 2338)  Labs:  Basename 08/14/11 0300 08/13/11 2129 08/13/11 1730 08/13/11 1500  HGB -- -- -- 10.4*  HCT -- -- -- 31.6*  PLT -- -- -- 148*  APTT -- -- -- --  LABPROT 14.3 -- -- 13.8  INR 1.09 -- -- 1.04  HEPARINUNFRC 0.29* -- -- --  CREATININE 2.32* -- -- 2.32*  CKTOTAL 118 148 -- --  CKMB 3.4 3.6 -- --  TROPONINI <0.30 <0.30 <0.30 --    Estimated Creatinine Clearance: 36 ml/min (by C-G formula based on Cr of 2.32).  Assessment: 56 yo female with chest pain for Heparin  Goal of Therapy:  Heparin level 0.3-0.7 units/ml Monitor platelets by anticoagulation protocol: Yes   Plan:  Increase Heparin  1200 units/hr F/U plan   Caryl Pina  4:32 AM 08/14/2011

## 2011-08-14 NOTE — Clinical Social Work Psychosocial (Signed)
     Clinical Social Work Department BRIEF PSYCHOSOCIAL ASSESSMENT 08/14/2011  Patient:  [name], [name]     Account Number:  [id number]     Admit date:  08/13/2011  Clinical Social Worker:  Otilio Saber  Date/Time:  08/14/2011 11:42 AM  Referred by:  RN  Date Referred:  08/14/2011 Referred for  Other - See comment   Other Referral:   "Patient lost her job recently and looking ways to get her medicines while out of work. Patient needs to go on coumadin and needs bimonthly lab drawn."   Interview type:  Patient Other interview type:    PSYCHOSOCIAL DATA Living Status:  ALONE Admitted from facility:   Level of care:   Primary support name:  Juliann Pulse Primary support relationship to patient:  SIBLING Degree of support available:   adequate    CURRENT CONCERNS Current Concerns  Other - See comment   Other Concerns:   medication assistance    SOCIAL WORK ASSESSMENT / PLAN Clinical Social Worker received referral for medication assistance. CSW and CM, Jackelyn Poling both spoke with patient regarding referral. Patient stated that she has great support around her and she has a PCP, Dr. Mellody Drown. Patient stated that she usually [facility] to receive her medications. CM will continue to follow along for assistance with medication needs. CSW will sign off as this was an inappropriate referral for SW.   Assessment/plan status:  No Further Intervention Required Other assessment/ plan:   Information/referral to community resources:    PATIENTS/FAMILYS RESPONSE TO PLAN OF CARE: Patient was appreciative of CSW and CM coming to see her and agreeable for CM to continue to follow along to assist with medication needs.

## 2011-08-14 NOTE — Progress Notes (Signed)
VASCULAR LAB PRELIMINARY  PRELIMINARY  PRELIMINARY  PRELIMINARY  Bilateral lower extremity venous Dopplers completed.    Preliminary report:  There is no obvious evidence of DVT or SVT noted in the bilateral lower extremities.  Sharion Dove [place], 08/14/2011, 11:23 AM

## 2011-08-14 NOTE — Progress Notes (Signed)
Subjective:  No chest pain or SOB  Objective:  Vital Signs in the last 24 hours: Temp:  [97.6 F (36.4 C)-98.7 F (37.1 C)] 97.8 F (36.6 C) (05/09 0814) Pulse Rate:  [43-67] 51  (05/08 2338) Resp:  [11-18] 15  (05/09 0800) BP: (94-150)/(21-73) 94/73 mmHg (05/09 0800) SpO2:  [91 %-100 %] 95 % (05/09 0800) Weight:  [125.193 kg (276 lb)-129.4 kg (285 lb 4.4 oz)] 129.4 kg (285 lb 4.4 oz) (05/08 2104)  Intake/Output from previous day:  Intake/Output Summary (Last 24 hours) at 08/14/11 1011 Last data filed at 08/14/11 0900  Gross per 24 hour  Intake    582 ml  Output    750 ml  Net   -168 ml    Physical Exam: General appearance: alert, cooperative, no distress and morbidly obese Lungs: clear to auscultation bilaterally Heart: regular rate and rhythm   Rate: 51  Rhythm: sinus bradycardia  Lab Results:  Basename 08/13/11 1500  WBC 8.4  HGB 10.4*  PLT 148*    Basename 08/14/11 0300 08/13/11 1500  NA 140 137  K 4.6 4.9  CL 105 99  CO2 25 25  GLUCOSE 92 200*  BUN 69* 69*  CREATININE 2.32* 2.32*    Basename 08/14/11 0300 08/13/11 2129  TROPONINI <0.30 <0.30   Hepatic Function Panel No results found for this basename: PROT,ALBUMIN,AST,ALT,ALKPHOS,BILITOT,BILIDIR,IBILI in the last 72 hours  Basename 08/14/11 0300  CHOL 149    Basename 08/14/11 0300  INR 1.09    Imaging: Imaging results have been reviewed  Cardiac Studies:  Assessment/Plan:   Active Problems:  Chest pain  Shortness of breath  Positive D dimer  Insulin dependent diabetes mellitus  History of pulmonary embolism, recurrent, 3 seperate episodes.  Renal insufficency, ? New, SCr 1.3 in June [id number]  Diastolic dysfunction, grade 2, EF 60% 2D June 2011  Anticoagulants causing adverse effect in therapeutic use, history of retinal bleeding on Coumadin  Morbid obesity, (no history of sleep apnea evaluation)  HTN (hypertension)  HLD (hyperlipidemia)  Anemia  Presence of IVC filter, placed June  2011 after 3d PE  Pulmonary HTN, Moderate  PA pressure 21mmHG 09/2009  Sinus bradycardia  LBBB (left bundle branch block)   Plan- Continue IVF and hold ARB. VQ today. She has not been on Coumadin in the past because of history of retinal bleeding. I'm not sure she needs Myoview if VQ is positive. Will decrease Lantus as her BS was 49 this am.    Kerin Ransom PA-C 08/14/2011, 10:11 AM  I have seen and examined the patient along with Kerin Ransom PA-C.  I have reviewed the chart, notes and new data. I agree with PA/NP's note.  Key new complaints: feels better Key new findings / data: no improvement in renal function, no overt bleeding; severely elevated D-dimer,  PLAN: Pulmonary embolism is most likely diagnosis. The patient states that retinal hemmorrhage is no longer an active issue after laser retinal surgery. Will recommend long-term anticoagulation with warfarin or equivalent. Workup for coronary insufficiency remains appropriate but can be pursued as an outpatient. Agree with holding ARB until we clarify chronicity of renal function decline; and reducing beta blocker due to bradycardia.  Sanda Klein, MD, [place] [phone] 08/14/2011, 10:29 AM

## 2011-08-14 NOTE — Progress Notes (Signed)
Pts. D-dimer result was called by lab. 11.17. Margarette Asal made aware of the result with orders made.

## 2011-08-14 NOTE — Care Management Note (Unsigned)
    Page 1 of 2   09/02/2011     1:15:02 PM   CARE MANAGEMENT NOTE 09/02/2011  Patient:  [name], [name]   Account Number:  [id number]  Date Initiated:  08/14/2011  Documentation initiated by:  Elissa Hefty  Subjective/Objective Assessment:   adm w ch pain, pos ddimer     Action/Plan:   lives alone, supp fam, pcp dr [name],   Anticipated DC Date:  09/05/2011   Anticipated DC Plan:  HOME/SELF CARE  In-house referral  Clinical Social Worker  Development worker, community      DC Planning Services  CM consult      Choice offered to / List presented to:             Status of service:  In process, will continue to follow Medicare Important Message given?   (If response is "NO", the following Medicare IM given date fields will be blank) Date Medicare IM given:   Date Additional Medicare IM given:    Discharge Disposition:    Per UR Regulation:  Reviewed for med. necessity/level of care/duration of stay  If discussed at [place] of Stay Meetings, dates discussed:   08/27/2011    Comments:  09/02/11  1310  [place], BSN [place] PT AT BEDSIDE;  SHE ALREADY HAS ARRANGED FOR MULTIPLE FRIENDS AND FAMILY TO STAY WITH HER 24/7  POST DISCHARGE, INCLUDING HER COUSIN, TINA BROWNING, WHO USED TO WORK AS [place]; PT REC  DME:   R/W AND TUB BENCH FOR D/C-  PLEASE ORDER; NCM WILL FOLLOW.   08/27/11  1218  [name] SIMMONS RN, BSN [phone] DISCHARGE DISPOSTION PENDING PROGRESS; PLEASE ORDER PT C/S WHEN APPROPRIATE; NCM WILL CONTINUE TO FOLLOW.   08/18/11 JULIE AMERSON,RN,BSN 1440 MET WITH PT TO DISCUSS DC PLANNING. PTA, PT INDEPENDENT, LIVES ALONE.  PCP IS DR Clabe Seal, WHO SHE STATES GIVES HER SAMPLES OF NEW MEDICATIONS.  SHE GETS MEDS FILLED AT [facility], AND STATES AS LONG AS THEY ARE GENERIC, SHE DOES NOT HAVE A PROBLEM.  PT STATES SHE IS IN THE PROCESS OF APPLYING FOR MEDICAID DISABILITY.  WILL CONSULT FINANCIAL COUNSELOR TO ASSIST WITH BILL  PAYMENT, OTHER FINANCIAL ISSUES.  WILL FOLLOW.  5/9 11:47a debbie dowell rn,bsn spoke w pt. she has pcp. she [facility]s [facility]00 meds. will await any new meds.

## 2011-08-14 NOTE — Progress Notes (Signed)
Subjective:  No chest pain or SOB now.  Objective:  Vital Signs in the last 24 hours: Temp:  [97.6 F (36.4 C)-98.7 F (37.1 C)] 97.8 F (36.6 C) (05/09 0814) Pulse Rate:  [43-67] 51  (05/08 2338) Resp:  [11-18] 15  (05/09 0800) BP: (94-150)/(21-73) 94/73 mmHg (05/09 0800) SpO2:  [91 %-100 %] 95 % (05/09 0800) Weight:  [125.193 kg (276 lb)-129.4 kg (285 lb 4.4 oz)] 129.4 kg (285 lb 4.4 oz) (05/08 2104)  Intake/Output from previous day:  Intake/Output Summary (Last 24 hours) at 08/14/11 1001 Last data filed at 08/14/11 0900  Gross per 24 hour  Intake    582 ml  Output    750 ml  Net   -168 ml    Physical Exam: General appearance: alert, cooperative, no distress and morbidly obese Lungs: clear to auscultation bilaterally Heart: regular rate and rhythm   Rate: 52  Rhythm: sinus bradycardia  Lab Results:  Basename 08/13/11 1500  WBC 8.4  HGB 10.4*  PLT 148*    Basename 08/14/11 0300 08/13/11 1500  NA 140 137  K 4.6 4.9  CL 105 99  CO2 25 25  GLUCOSE 92 200*  BUN 69* 69*  CREATININE 2.32* 2.32*    Basename 08/14/11 0300 08/13/11 2129  TROPONINI <0.30 <0.30   Hepatic Function Panel No results found for this basename: PROT,ALBUMIN,AST,ALT,ALKPHOS,BILITOT,BILIDIR,IBILI in the last 72 hours  Basename 08/14/11 0300  CHOL 149    Basename 08/14/11 0300  INR 1.09    Imaging: Imaging results have been reviewed  Cardiac Studies:  Assessment/Plan:   Active Problems:  Chest pain  Shortness of breath  Positive D dimer, R/O 4th PE  Insulin dependent diabetes mellitus  History of pulmonary embolism, recurrent, 3 seperate episodes.  Diastolic dysfunction, grade 2, EF 60% 2D June 2011  Anticoagulants causing adverse effect in therapeutic use, history of retinal bleeding on Coumadin  Morbid obesity, (no history of sleep apnea evaluation)  HTN (hypertension)  HLD (hyperlipidemia)  Anemia  Presence of IVC filter, placed June 2011 after 3d PE  Pulmonary HTN,  Moderate  PA pressure 78mmHG 09/2009  Sinus bradycardia  LBBB (left bundle branch block)  Plan- she is on Heparin, history of retinal bleeding in the past on Coumadin, IVC placed June 2011 after 3d PE. Renal insufficiency is new (SCr 1.35 June 2011). She is for VQ today, unable to do do Myoview till Saturday (will discuss this with MD, not sure she needs Myoview if VQ is positive). She is hypotensive, will continue IVF, hold ARB. She is bradycardic and probably wont tolerate beta blocker. BS 49 this am, will decrease HS     Kerin Ransom PA-C 08/14/2011, 10:01 AM

## 2011-08-15 LAB — HEMOGLOBIN AND HEMATOCRIT, BLOOD
HCT: 27.9 % — ABNORMAL LOW (ref 36.0–46.0)
Hemoglobin: 8.9 g/dL — ABNORMAL LOW (ref 12.0–15.0)

## 2011-08-15 LAB — BASIC METABOLIC PANEL
BUN: 58 mg/dL — ABNORMAL HIGH (ref 6–23)
CO2: 24 mEq/L (ref 19–32)
Calcium: 8.7 mg/dL (ref 8.4–10.5)
Chloride: 108 mEq/L (ref 96–112)
Creatinine, Ser: 1.88 mg/dL — ABNORMAL HIGH (ref 0.50–1.10)
GFR calc Af Amer: 34 mL/min — ABNORMAL LOW (ref >90)
GFR calc non Af Amer: 29 mL/min — ABNORMAL LOW (ref >90)
Glucose, Bld: 88 mg/dL (ref 70–99)
Potassium: 4.5 mEq/L (ref 3.5–5.1)
Sodium: 140 mEq/L (ref 135–145)

## 2011-08-15 LAB — CBC
HCT: 27.8 % — ABNORMAL LOW (ref 36.0–46.0)
HCT: 30.7 % — ABNORMAL LOW (ref 36.0–46.0)
Hemoglobin: 9.1 g/dL — ABNORMAL LOW (ref 12.0–15.0)
Hemoglobin: 9.9 g/dL — ABNORMAL LOW (ref 12.0–15.0)
MCH: 31.3 pg (ref 26.0–34.0)
MCH: 30.9 pg (ref 26.0–34.0)
MCHC: 32.7 g/dL (ref 30.0–36.0)
MCHC: 32.2 g/dL (ref 30.0–36.0)
MCV: 95.5 fL (ref 78.0–100.0)
MCV: 95.9 fL (ref 78.0–100.0)
Platelets: 121 10*3/uL — ABNORMAL LOW (ref 150–400)
Platelets: 122 10*3/uL — ABNORMAL LOW (ref 150–400)
RBC: 2.91 MIL/uL — ABNORMAL LOW (ref 3.87–5.11)
RBC: 3.2 MIL/uL — ABNORMAL LOW (ref 3.87–5.11)
RDW: 13.9 % (ref 11.5–15.5)
RDW: 13.9 % (ref 11.5–15.5)
WBC: 4.9 10*3/uL (ref 4.0–10.5)
WBC: 5.6 10*3/uL (ref 4.0–10.5)

## 2011-08-15 LAB — GLUCOSE, CAPILLARY
Glucose-Capillary: 94 mg/dL (ref 70–99)
Glucose-Capillary: 150 mg/dL — ABNORMAL HIGH (ref 70–99)
Glucose-Capillary: 162 mg/dL — ABNORMAL HIGH (ref 70–99)

## 2011-08-15 LAB — OCCULT BLOOD X 1 CARD TO LAB, STOOL: Fecal Occult Bld: NEGATIVE

## 2011-08-15 MED ORDER — FUROSEMIDE 40 MG PO TABS
40.0000 mg | ORAL_TABLET | Freq: Every day | ORAL | Status: DC
  Administered 2011-08-15 – 2011-08-17 (×3): 40 mg via ORAL
  Filled 2011-08-15 (×5): qty 1

## 2011-08-15 NOTE — Progress Notes (Signed)
Subjective:  No chest pain  Objective:  Vital Signs in the last 24 hours: Temp:  [97.3 F (36.3 C)-98.4 F (36.9 C)] 97.8 F (36.6 C) (05/10 0259) Pulse Rate:  [53-78] 54  (05/09 2157) Resp:  [11-20] 17  (05/10 0752) BP: (117-149)/(33-54) 126/47 mmHg (05/10 0752) SpO2:  [97 %-99 %] 99 % (05/10 0752) Weight:  [132.8 kg (292 lb 12.3 oz)] 132.8 kg (292 lb 12.3 oz) (05/10 0546)  Intake/Output from previous day:  Intake/Output Summary (Last 24 hours) at 08/15/11 0816 Last data filed at 08/15/11 0700  Gross per 24 hour  Intake 1753.5 ml  Output   2680 ml  Net -926.5 ml    Physical Exam: General appearance: alert, cooperative, no distress and morbidly obese Lungs: clear to auscultation bilaterally Heart: regular rate and rhythm   Rate: 60  Rhythm: normal sinus rhythm  Lab Results:  Basename 08/15/11 0433 08/13/11 1500  WBC 4.9 8.4  HGB 9.1* 10.4*  PLT 121* 148*    Basename 08/15/11 0433 08/14/11 0300  NA 140 140  K 4.5 4.6  CL 108 105  CO2 24 25  GLUCOSE 88 92  BUN 58* 69*  CREATININE 1.88* 2.32*    Basename 08/14/11 0938 08/14/11 0300  TROPONINI <0.30 <0.30   Hepatic Function Panel No results found for this basename: PROT,ALBUMIN,AST,ALT,ALKPHOS,BILITOT,BILIDIR,IBILI in the last 72 hours  Basename 08/14/11 0300  CHOL 149    Basename 08/14/11 0300  INR 1.09    Imaging: Imaging results have been reviewed  Cardiac Studies:  Assessment/Plan:   Active Problems:  Chest pain, jaw pain, SOB, diaphoresis on admission   Positive D dimer- 11.17  History of pulmonary embolism, recurrent, 3 seperate episodes.  Presence of IVC filter, placed June 2011 after 3d PE  Anticoagulants causing adverse effect in therapeutic use, history of retinal bleeding on Coumadin  Insulin dependent diabetes mellitus  Diastolic dysfunction, grade 2, EF 60% 2D June 2011  Renal insufficiency, SCr improving off Benicar- 1.8 today  Morbid obesity, (no history of sleep apnea  evaluation)  HTN (hypertension)  HLD (hyperlipidemia)  Anemia  Pulmonary HTN, Moderate  PA pressure 2mmHG 09/2009  Sinus bradycardia  LBBB (left bundle branch block)   Plan- VQ is low probability for PE. She has an IVC filter in place. LE venous dopplers negative for DVT. I am not sure why D-dimer is 11.17-? Pelvic DVT. Continue Heparin for now.  Her symptoms could be anginal, - jaw pain, acute dyspnea, diaphoresis. Discussed with Dr Sallyanne Kuster, proceed with 2 day Myoview. Consider abd/pelvic CT to r/o DVT?  Heme consult before we commit her to Coumadin? Resume diuretic at decreased dose, continue to hold ARB   Kerin Ransom PA-C 08/15/2011, 8:16 AM  I have seen and examined the patient along with Kerin Ransom PA-C.  I have reviewed the chart, notes and new data.  I agree with PA's note.  Key new complaints: bright red hematochezia with BM. Sounds hemmorhoidal. Hemoccult earlier was negative Key examination changes: no longer bradycardic or hypotensive. Key new findings / data: mild reduction in Hgb (9.9 to 9.1); iron studies normal, although ferritin low normal; V/Q very low prob; LE Dopplers negative; renal function improved but still above previous year's baseline (vreat 1.3).  PLAN: Will ask for GI evaluation; warfarin will be required as long term rx. Avoid contrast based procedures if possible. Myoview tomorrow.  Sanda Klein, MD, [place] [phone] 08/15/2011, 3:27 PM

## 2011-08-15 NOTE — Progress Notes (Signed)
ANTICOAGULATION CONSULT NOTE - Follow Up Consult  Pharmacy Consult for heparin Indication: chest pain/ACS and ?DVT  Vital Signs: Temp: 97.9 F (36.6 C) (05/10 0823) Temp src: Oral (05/10 0823) BP: 126/47 mmHg (05/10 0752) Pulse Rate: 60  (05/10 0823)  Labs:  Basename 08/15/11 0433 08/14/11 1227 08/14/11 0938 08/14/11 0300 08/13/11 2129 08/13/11 1500  HGB 9.1* -- -- -- -- 10.4*  HCT 27.8* -- -- -- -- 31.6*  PLT 121* -- -- -- -- 148*  APTT -- -- -- -- -- --  LABPROT -- -- -- 14.3 -- 13.8  INR -- -- -- 1.09 -- 1.04  HEPARINUNFRC 0.49 0.39 -- 0.29* -- --  CREATININE 1.88* -- -- 2.32* -- 2.32*  CKTOTAL -- -- 106 118 148 --  CKMB -- -- 3.0 3.4 3.6 --  TROPONINI -- -- <0.30 <0.30 <0.30 --    Estimated Creatinine Clearance: 45.2 ml/min (by C-G formula based on Cr of 1.88).  Medications:  Infusions:     . sodium chloride 75 mL/hr at 08/14/11 0900  . sodium chloride 75 mL/hr (08/15/11 0754)  . heparin 1,200 Units/hr (08/15/11 0700)  . DISCONTD: nitroGLYCERIN 5 mcg/min (08/14/11 0701)   Assessment: 66 yof started on heparin for chest pain but now thought to possibly have a DVT.  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 Heparin level is therapeutic at 0.49. No bleeding noted. CT to rule out abd/pelvic DVT   Goal of Therapy:  Heparin level 0.3-0.7 units/ml Monitor platelets by anticoagulation protocol: Yes   Plan:  1. Continue heparin at 1200units/hr 2. Follow up AM labs  Tad Moore 08/15/2011,8:51 AM

## 2011-08-15 NOTE — Progress Notes (Signed)
Called by RN because pt complained of PRBPR.  Confirmed by RN.  Pt with Hx of DVT, S/P IVC filter.  D Dimer 11.17, low probabilty of PE.  Negative DVT on dopplers.  Concern for Angina/pelvic DVT.  On heparin.  Will continue.  Monitor BMs and HGB.  May need to DC heparin.  [name] W 3:15 PM

## 2011-08-15 NOTE — Plan of Care (Signed)
Problem: Phase II Progression Outcomes Goal: Stress Test if indicated Outcome: Progressing Scheduled in two parts 5/11 and 5/12

## 2011-08-16 ENCOUNTER — Inpatient Hospital Stay (HOSPITAL_COMMUNITY): Payer: Medicaid Other

## 2011-08-16 DIAGNOSIS — D649 Anemia, unspecified: Secondary | ICD-10-CM

## 2011-08-16 DIAGNOSIS — T45515A Adverse effect of anticoagulants, initial encounter: Secondary | ICD-10-CM

## 2011-08-16 DIAGNOSIS — K625 Hemorrhage of anus and rectum: Secondary | ICD-10-CM | POA: Diagnosis present

## 2011-08-16 LAB — HEPARIN LEVEL (UNFRACTIONATED): Heparin Unfractionated: <0.1 IU/mL — ABNORMAL LOW (ref 0.30–0.70)

## 2011-08-16 LAB — BASIC METABOLIC PANEL
BUN: 48 mg/dL — ABNORMAL HIGH (ref 6–23)
CO2: 22 mEq/L (ref 19–32)
Calcium: 8.6 mg/dL (ref 8.4–10.5)
Chloride: 109 mEq/L (ref 96–112)
Creatinine, Ser: 1.63 mg/dL — ABNORMAL HIGH (ref 0.50–1.10)
GFR calc Af Amer: 40 mL/min — ABNORMAL LOW (ref >90)
GFR calc non Af Amer: 34 mL/min — ABNORMAL LOW (ref >90)
Glucose, Bld: 106 mg/dL — ABNORMAL HIGH (ref 70–99)
Potassium: 4.4 mEq/L (ref 3.5–5.1)
Sodium: 141 mEq/L (ref 135–145)

## 2011-08-16 LAB — IRON AND TIBC
Iron: 77 ug/dL (ref 42–135)
TIBC: 373 ug/dL (ref 250–470)

## 2011-08-16 LAB — CBC
HCT: 27.7 % — ABNORMAL LOW (ref 36.0–46.0)
Hemoglobin: 9 g/dL — ABNORMAL LOW (ref 12.0–15.0)
MCH: 31 pg (ref 26.0–34.0)
MCHC: 32.5 g/dL (ref 30.0–36.0)
MCV: 95.5 fL (ref 78.0–100.0)
Platelets: 118 10*3/uL — ABNORMAL LOW (ref 150–400)
RBC: 2.9 MIL/uL — ABNORMAL LOW (ref 3.87–5.11)
RDW: 14.1 % (ref 11.5–15.5)
WBC: 4.7 10*3/uL (ref 4.0–10.5)

## 2011-08-16 LAB — HEMOGLOBIN AND HEMATOCRIT, BLOOD
HCT: 30 % — ABNORMAL LOW (ref 36.0–46.0)
Hemoglobin: 9.7 g/dL — ABNORMAL LOW (ref 12.0–15.0)

## 2011-08-16 LAB — FOLATE: Folate: >20 ng/mL

## 2011-08-16 LAB — GLUCOSE, CAPILLARY: Glucose-Capillary: 185 mg/dL — ABNORMAL HIGH (ref 70–99)

## 2011-08-16 LAB — FERRITIN: Ferritin: 34 ng/mL (ref 10–291)

## 2011-08-16 LAB — VITAMIN B12: Vitamin B-12: 581 pg/mL (ref 211–911)

## 2011-08-16 MED ORDER — REGADENOSON 0.4 MG/5ML IV SOLN
0.4000 mg | Freq: Once | INTRAVENOUS | Status: AC
  Administered 2011-08-16: 0.4 mg via INTRAVENOUS
  Filled 2011-08-16: qty 5

## 2011-08-16 NOTE — Consult Note (Signed)
Patient seen, examined, and I agree with the above documentation, including the assessment and plan. Patient with hematochezia, but none over the last 8 or so hours. This occurred in the setting of heparin infusion. Heparin has been stopped. Differential includes a mild ischemic colitis with subsequent bleeding, also does have a history of internal hemorrhoids and this could have bled especially in the setting of IV heparin She remains hemodynamically stable, and is due to complete the second part of her stress test tomorrow. She's also had intermittent cramping and sharp abdominal pain which doesn't have a clear trigger. Agree with CT scan of the abdomen/pelvis, with IV contrast after completing stress test Await additional lab testing, including iron studies Dr. Collene Mares will take over care after the weekend

## 2011-08-16 NOTE — Plan of Care (Signed)
Problem: Phase I Progression Outcomes Goal: Other Phase I Outcomes/Goals Outcome: Progressing Stress test procedures reviewed

## 2011-08-16 NOTE — Progress Notes (Signed)
OOB to [facility] w/steady gait .voided 1086ml.bright red (scant amt) drainage from rectal area(Pt was assisted so that RN could assess where the bloody drainage was coming from).

## 2011-08-16 NOTE — Consult Note (Signed)
Referring Provider: [facility] Cardiology- Primary Care Physician:  [name], MD, MD Primary Gastroenterologist:  Twanna Hy.  Reason for Consultation:  Rectal bleeding  HPI: [name] is a 56 y.o. female with history of morbid obesity, pulmonary hypertension, congestive heart failure, insulin-dependent diabetes mellitus. She has history of a pulmonary embolus in 2004 and recurrent DVTs. She is status post IVC filter placement. She is status post cholecystectomy and states that she had a screening colonoscopy done at age 65 per Dr. Tyrone Sage which by the patient's report was negative. Patient was admitted here on 08/13/2011 after presenting to the emergency room with complaints of progressive fatigue shortness of breath and then onset of chest pain and diaphoresis on the day of admission. She had also noted that she had had a small amount of blood in in her stool intermittently. She did have some EKG changes and was started on IV heparin and nitroglycerin. She is currently undergoing a two-day Myoview study which will be completed tomorrow. Is currently chest pain-free. On admission patient had hemoglobin of 10.4, on 08/15/2011 hemoglobin 9.1 and today hemoglobin of 9.0 hematocrit of 27 MCV of 95.5 and platelet count 118. Reviewing prior labs she had a hemoglobin of 9.6 in June of 2011. She has also had a mild thrombocytopenia. Patient is unaware that she was anemic 2 years ago but says she was told recently that her blood counts were low. Patient says she was constipated on admission and had 2 hard bowel movements yesterday without any evidence of blood. She developed some abdominal cramping yesterday afternoon and then last evening while urinating past which she describes as a large amount of blood without any stool. Later in the evening she had another episode and says she passed some clots and blood. Last night she had ongoing lower abdominal cramping and had to take some pain medication.  She has not had any associated diaphoresis nausea or vomiting and feels better today though she says she is still having some mild lower, cramping. She's not had any further rectal bleeding. She has not had a bowel movement today.  Patient also mentions that over the past year she has had some intermittent episodes of abdominal cramping which she describes as a twisting type feeling which is quite uncomfortable and at times takes her breath away. She has been concerned about this but has not had any workup.       Past Medical History  Diagnosis Date  . CHF (congestive heart failure)   . Diabetes mellitus   . Arthritis   . Hypertension   . Depression   . DVT (deep venous thrombosis) 10 years ago  . PE (pulmonary thromboembolism) 3 years ag0    Past Surgical History  Procedure Date  . Eye surgery   . Cholecystectomy   . Cesarean section   . Ivc     ivc filter    Prior to Admission medications   Medication Sig Start Date End Date Taking? Authorizing Provider  aspirin EC 81 MG tablet Take 81 mg by mouth daily.   Yes Historical Provider, MD  citalopram (CELEXA) 20 MG tablet Take 20 mg by mouth at bedtime.   Yes Historical Provider, MD  diazepam (VALIUM) 5 MG tablet Take 5 mg by mouth at bedtime as needed. For muscle spasms.   Yes Historical Provider, MD  furosemide (LASIX) 80 MG tablet Take 80 mg by mouth 2 (two) times daily.   Yes Historical Provider, MD  glimepiride (AMARYL) 4 MG tablet  Take 4 mg by mouth daily before breakfast.   Yes Historical Provider, MD  HYDROcodone-acetaminophen (VICODIN) 5-500 MG per tablet Take 1 tablet by mouth every 8 (eight) hours as needed. For pain.   Yes Historical Provider, MD  insulin glargine (LANTUS) 100 UNIT/ML injection Inject 17-21 Units into the skin See admin instructions. She uses a sliding scale.   Yes Historical Provider, MD  levothyroxine (SYNTHROID, LEVOTHROID) 25 MCG tablet Take 25 mcg by mouth daily.   Yes Historical Provider, MD    metFORMIN (GLUCOPHAGE) 1000 MG tablet Take 1,000 mg by mouth 2 (two) times daily with a meal.   Yes Historical Provider, MD  metoprolol (LOPRESSOR) 50 MG tablet Take 50 mg by mouth 2 (two) times daily.   Yes Historical Provider, MD  Multiple Vitamin (MULITIVITAMIN WITH MINERALS) TABS Take 1 tablet by mouth daily.   Yes Historical Provider, MD  olmesartan (BENICAR) 20 MG tablet Take 20 mg by mouth daily.   Yes Historical Provider, MD  Omega-3 Fatty Acids (FISH OIL) 1200 MG CAPS Take 1 capsule by mouth 2 (two) times daily.   Yes Historical Provider, MD  OVER THE COUNTER MEDICATION Take 3 tablets by mouth 2 (two) times daily. Raspberry Ketones.   Yes Historical Provider, MD  OVER THE COUNTER MEDICATION Take 2 tablets by mouth daily. Cherry 1000mg  tablets.   Yes Historical Provider, MD  spironolactone (ALDACTONE) 25 MG tablet Take 25 mg by mouth daily.   Yes Historical Provider, MD    Current Facility-Administered Medications  Medication Dose Route Frequency Provider Last Rate Last Dose  . 0.9 %  sodium chloride infusion   Intravenous Continuous Doreene Burke [place], [place] 75 mL/hr at 08/16/11 0754 75 mL/hr at 08/16/11 0754  . acetaminophen (TYLENOL) tablet 650 mg  650 mg Oral Q4H PRN Brett Canales, PA      . aspirin EC tablet 81 mg  81 mg Oral Daily Brett Canales, PA   81 mg at 08/16/11 1102  . citalopram (CELEXA) tablet 20 mg  20 mg Oral QHS Brett Canales, PA   20 mg at 08/15/11 2224  . diazepam (VALIUM) tablet 5 mg  5 mg Oral QHS PRN Brett Canales, PA      . furosemide (LASIX) tablet 40 mg  40 mg Oral Daily Doreene Burke [place], [place]   40 mg at 08/16/11 1102  . HYDROcodone-acetaminophen (NORCO) 5-325 MG per tablet 1 tablet  1 tablet Oral Q6H PRN Brett Canales, PA   1 tablet at 08/15/11 2039  . insulin aspart (novoLOG) injection 0-9 Units  0-9 Units Subcutaneous TID WC Doreene Burke [place], PA   1 Units at 08/16/11 1236  . insulin glargine (LANTUS) injection 9 Units  9 Units Subcutaneous QHS Doreene Burke [place], [place]   9 Units at  08/15/11 2226  . levothyroxine (SYNTHROID, LEVOTHROID) tablet 50 mcg  50 mcg Oral QAC breakfast Doreene Burke [place], [place]   50 mcg at 08/16/11 0754  . metoprolol tartrate (LOPRESSOR) tablet 25 mg  25 mg Oral BID Doreene Burke [place], PA   25 mg at 08/16/11 1102  . omega-3 acid ethyl esters (LOVAZA) capsule 1 g  1 g Oral BID Brett Canales, PA   1 g at 08/16/11 1102  . ondansetron (ZOFRAN) injection 4 mg  4 mg Intravenous Q6H PRN Brett Canales, PA      . pantoprazole (PROTONIX) EC tablet 40 mg  40 mg Oral [address] [place], [place]   40  mg at 08/16/11 1102  . regadenoson (LEXISCAN) injection SOLN 0.4 mg  0.4 mg Intravenous Once Lendon Colonel, NP   0.4 mg at 08/16/11 0939  . DISCONTD: heparin ADULT infusion 100 units/mL (25000 units/250 mL)  1,200 Units/hr Intravenous Continuous Mihai Croitoru, MD   1,200 Units/hr at 08/15/11 1226    Allergies as of 08/13/2011 - Review Complete 08/13/2011  Allergen Reaction Noted  . Morphine and related Rash 08/13/2011    No family history on file.  History   Social History  . Marital Status: Divorced    Spouse Name: N/A    Number of Children: N/A  . Years of Education: N/A   Occupational History  . Not on file.   Social History Main Topics  . Smoking status: Passive Smoker  . Smokeless tobacco: Not on file  . Alcohol Use: No  . Drug Use: No  . Sexually Active: No   Other Topics Concern  . Not on file   Social History Narrative  . No narrative on file    Review of Systems: Pertinent positive and negative review of systems were noted in the above HPI section.  All other review of systems was otherwise negative. Physical Exam: Vital signs in last 24 hours: Temp:  [97.6 F (36.4 C)-98.3 F (36.8 C)] 97.9 F (36.6 C) (05/11 1133) Pulse Rate:  [54-78] 66  (05/11 0941) Resp:  [12-16] 15  (05/11 1133) BP: (117-171)/(39-65) 158/63 mmHg (05/11 1133) SpO2:  [96 %-98 %] 98 % (05/11 1133) Last BM Date: 08/15/11 General:   Alert,  Well-developed,obese,  pleasant and cooperative in NAD,up in chair Head:  Normocephalic and atraumatic. Eyes:  Sclera clear, no icterus.   Conjunctiva pink. Ears:  Normal auditory acuity. Nose:  No deformity, discharge,  or lesions. Mouth:  No deformity or lesions.   Neck:  Supple; no masses or thyromegaly. Lungs:  Clear throughout to auscultation.   No wheezes, crackles, or rhonchi. Heart:  Regular rate and rhythm; no murmurs, clicks, rubs,  or gallops. Abdomen:  Soft,large, tender LMq and LLQ,suprapubic, no guarding, no rebound, no mass Rectal -not done- nursing reports scant amt of blood with urinating this am, negative rectal  on admit  Msk:  Symmetrical without gross deformities. . Pulses:  Normal pulses noted. Extremities:  Without clubbing or edema. Neurologic:  Alert and  oriented x4;  grossly normal neurologically. Skin:  Stasis changes in legs bilaterally Psych:  Alert and cooperative. Normal mood and affect.  Intake/Output from previous day: 05/10 0701 - 05/11 0700 In: 2400 [P.O.:480; I.V.:1920] Out: 2003 [Urine:2000; Stool:3] Intake/Output this shift: Total I/O In: 195 [P.O.:120; I.V.:75] Out: 375 [Urine:375]  Lab Results:  Basename 08/16/11 0525 08/15/11 2325 08/15/11 1600 08/15/11 0847 08/15/11 0433  WBC 4.7 -- -- 5.6 4.9  HGB 9.0* 8.9* 9.1* -- --  HCT 27.7* 27.5* 27.9* -- --  PLT 118* -- -- 122* 121*   BMET  Basename 08/16/11 0525 08/15/11 0433 08/14/11 0300  NA 141 140 140  K 4.4 4.5 4.6  CL 109 108 105  CO2 22 24 25   GLUCOSE 106* 88 92  BUN 48* 58* 69*  CREATININE 1.63* 1.88* 2.32*  CALCIUM 8.6 8.7 9.3   LFT No results found for this basename: PROT,ALBUMIN,AST,ALT,ALKPHOS,BILITOT,BILIDIR,IBILI in the last 72 hours PT/INR  Basename 08/14/11 0300 08/13/11 1500  LABPROT 14.3 13.8  INR 1.09 1.04      Studies/Results: Nm Pulmonary Perfusion  08/14/2011  *RADIOLOGY REPORT*  Clinical Data: Shortness of breath.  NM  PULMONARY PERFUSION PARTICULATE  Radiopharmaceutical: 6  millicuries technetium 36m MAA  Comparison: Plain film of the chest 08/13/2011 1540 hours.  Findings: Perfusion is mildly heterogeneous but no segmental or subsegmental defect is identified.  IMPRESSION: Very low probability for pulmonary embolus.  Original Report Authenticated By: Arvid Right. Luther Parody, M.D.    IMPRESSION:  #52 56 year old female admitted with chest pain, shortness of breath and diaphoresis concerning for angina. She has been pain-free on nitroglycerin, and is undergoing Myoview today and tomorrow. #2 history of recurrent DVTs and prior PE, VQ scan here low probability for PE #3 status post IVC filter #4 hematochezia associated with lower abdominal cramping insetting of IV heparin. Consider mild ischemic colitis versus internal hemorrhoid. Patient has had prior colonoscopy and is unaware of any diagnosis of diverticular disease. She is stable and has not had any active bleeding today #5 normocytic anemia which appears chronic and mild thrombocytopenia. Rule out myelodysplastic process, we'll also rule out iron deficiency #6 morbid obesity #7 history of CHF  PLAN: #1 serial hemoglobins and transfuse as indicated #2 iron studies #3 CT of the abdomen and pelvis may be diagnostic for ischemic colitis, and would also further evaluate her intermittent complaints of severe abdominal cramping. Will wait until her Myoview is completed . #4 Heparin is currently off, defer to cardiology regarding necessity. #5 Depending on her course and findings on CT, she may need repeat colonoscopy once her cardiac workup is completed. Dr. Collene Mares will assume her care on Monday, 08/17/2028   Nayeliz Hipp [place]  08/16/2011, 12:41 PM

## 2011-08-16 NOTE — Progress Notes (Signed)
[facility]  DAILY PROGRESS NOTE   Subjective:  No events overnight. Undergoing 2 day stress test. GI evaluated for hematochezia.  Objective:  Temp:  [97.6 F (36.4 C)-98.3 F (36.8 C)] 97.9 F (36.6 C) (05/11 1133) Pulse Rate:  [54-78] 66  (05/11 0941) Resp:  [12-16] 15  (05/11 1133) BP: (117-171)/(39-65) 158/63 mmHg (05/11 1133) SpO2:  [96 %-98 %] 98 % (05/11 1133) Weight change:   Intake/Output from previous day: 05/10 0701 - 05/11 0700 In: 2400 [P.O.:480; I.V.:1920] Out: 2003 [Urine:2000; Stool:3]  Intake/Output from this shift: Total I/O In: 770 [P.O.:320; I.V.:450] Out: 600 [Urine:600]  Medications: Current Facility-Administered Medications  Medication Dose Route Frequency Provider Last Rate Last Dose  . 0.9 %  sodium chloride infusion   Intravenous Continuous Doreene Burke [place], [place] 75 mL/hr at 08/16/11 0754 75 mL/hr at 08/16/11 0754  . acetaminophen (TYLENOL) tablet 650 mg  650 mg Oral Q4H PRN Brett Canales, PA      . aspirin EC tablet 81 mg  81 mg Oral Daily Brett Canales, PA   81 mg at 08/16/11 1102  . citalopram (CELEXA) tablet 20 mg  20 mg Oral QHS Brett Canales, PA   20 mg at 08/15/11 2224  . diazepam (VALIUM) tablet 5 mg  5 mg Oral QHS PRN Brett Canales, PA      . furosemide (LASIX) tablet 40 mg  40 mg Oral Daily Doreene Burke [place], [place]   40 mg at 08/16/11 1102  . HYDROcodone-acetaminophen (NORCO) 5-325 MG per tablet 1 tablet  1 tablet Oral Q6H PRN Brett Canales, PA   1 tablet at 08/15/11 2039  . insulin aspart (novoLOG) injection 0-9 Units  0-9 Units Subcutaneous TID WC Doreene Burke [place], PA   1 Units at 08/16/11 1236  . insulin glargine (LANTUS) injection 9 Units  9 Units Subcutaneous QHS Doreene Burke [place], [place]   9 Units at 08/15/11 2226  . levothyroxine (SYNTHROID, LEVOTHROID) tablet 50 mcg  50 mcg Oral QAC breakfast Doreene Burke [place], [place]   50 mcg at 08/16/11 0754  . metoprolol tartrate (LOPRESSOR) tablet 25 mg  25 mg Oral BID Doreene Burke [place], PA   25 mg  at 08/16/11 1102  . omega-3 acid ethyl esters (LOVAZA) capsule 1 g  1 g Oral BID Brett Canales, PA   1 g at 08/16/11 1102  . ondansetron (ZOFRAN) injection 4 mg  4 mg Intravenous Q6H PRN Brett Canales, PA      . pantoprazole (PROTONIX) EC tablet 40 mg  40 mg Oral Q1200 Erlene Quan, PA   40 mg at 08/16/11 1102  . regadenoson (LEXISCAN) injection SOLN 0.4 mg  0.4 mg Intravenous Once Lendon Colonel, NP   0.4 mg at 08/16/11 0939  . DISCONTD: heparin ADULT infusion 100 units/mL (25000 units/250 mL)  1,200 Units/hr Intravenous Continuous Sanda Klein, MD   1,200 Units/hr at 08/15/11 1226    Physical Exam: General appearance: alert and no distress Neck: no adenopathy, no carotid bruit, no JVD, supple, symmetrical, trachea midline and thyroid not enlarged, symmetric, no tenderness/mass/nodules Lungs: clear to auscultation bilaterally Heart: regular rate and rhythm, S1, S2 normal, no murmur, click, rub or gallop Abdomen: soft, non-tender; bowel sounds normal; no masses,  no organomegaly Extremities: extremities normal, atraumatic, no cyanosis or edema Pulses: 2+ and symmetric  Lab Results: Results for orders placed during the hospital encounter of 08/13/11 (from the past 48 hour(s))  GLUCOSE,  CAPILLARY     Status: Abnormal   Collection Time   08/14/11  4:09 PM      Component Value Range Comment   Glucose-Capillary 150 (*) 70 - 99 (mg/dL)   GLUCOSE, CAPILLARY     Status: Abnormal   Collection Time   08/14/11  9:49 PM      Component Value Range Comment   Glucose-Capillary 145 (*) 70 - 99 (mg/dL)   CBC     Status: Abnormal   Collection Time   08/15/11  4:33 AM      Component Value Range Comment   WBC 4.9  4.0 - 10.5 (K/uL)    RBC 2.91 (*) 3.87 - 5.11 (MIL/uL)    Hemoglobin 9.1 (*) 12.0 - 15.0 (g/dL)    HCT 27.8 (*) 36.0 - 46.0 (%)    MCV 95.5  78.0 - 100.0 (fL)    MCH 31.3  26.0 - 34.0 (pg)    MCHC 32.7  30.0 - 36.0 (g/dL)    RDW 13.9  11.5 - 15.5 (%)    Platelets 121 (*) 150 - 400  (K/uL)   HEPARIN LEVEL (UNFRACTIONATED)     Status: Normal   Collection Time   08/15/11  4:33 AM      Component Value Range Comment   Heparin Unfractionated 0.49  0.30 - 0.70 (IU/mL)   BASIC METABOLIC PANEL     Status: Abnormal   Collection Time   08/15/11  4:33 AM      Component Value Range Comment   Sodium 140  135 - 145 (mEq/L)    Potassium 4.5  3.5 - 5.1 (mEq/L)    Chloride 108  96 - 112 (mEq/L)    CO2 24  19 - 32 (mEq/L)    Glucose, Bld 88  70 - 99 (mg/dL)    BUN 58 (*) 6 - 23 (mg/dL)    Creatinine, Ser 1.88 (*) 0.50 - 1.10 (mg/dL)    Calcium 8.7  8.4 - 10.5 (mg/dL)    GFR calc non Af Amer 29 (*) >90 (mL/min)    GFR calc Af Amer 34 (*) >90 (mL/min)   FERRITIN     Status: Normal   Collection Time   08/15/11  4:33 AM      Component Value Range Comment   Ferritin 29  10 - 291 (ng/mL)   IRON AND TIBC     Status: Normal   Collection Time   08/15/11  4:33 AM      Component Value Range Comment   Iron 69  42 - 135 (ug/dL)    TIBC 327  250 - 470 (ug/dL)    Saturation Ratios 21  20 - 55 (%)    UIBC 258  125 - 400 (ug/dL)   GLUCOSE, CAPILLARY     Status: Normal   Collection Time   08/15/11  7:42 AM      Component Value Range Comment   Glucose-Capillary 94  70 - 99 (mg/dL)   CBC     Status: Abnormal   Collection Time   08/15/11  8:47 AM      Component Value Range Comment   WBC 5.6  4.0 - 10.5 (K/uL)    RBC 3.20 (*) 3.87 - 5.11 (MIL/uL)    Hemoglobin 9.9 (*) 12.0 - 15.0 (g/dL)    HCT 30.7 (*) 36.0 - 46.0 (%)    MCV 95.9  78.0 - 100.0 (fL)    MCH 30.9  26.0 - 34.0 (pg)  MCHC 32.2  30.0 - 36.0 (g/dL)    RDW 13.9  11.5 - 15.5 (%)    Platelets 122 (*) 150 - 400 (K/uL)   OCCULT BLOOD X 1 CARD TO LAB, STOOL     Status: Normal   Collection Time   08/15/11  9:49 AM      Component Value Range Comment   Fecal Occult Bld NEGATIVE     GLUCOSE, CAPILLARY     Status: Abnormal   Collection Time   08/15/11 11:33 AM      Component Value Range Comment   Glucose-Capillary 150 (*) 70 - 99  (mg/dL)   HEMOGLOBIN AND HEMATOCRIT, BLOOD     Status: Abnormal   Collection Time   08/15/11  4:00 PM      Component Value Range Comment   Hemoglobin 9.1 (*) 12.0 - 15.0 (g/dL)    HCT 27.9 (*) 36.0 - 46.0 (%)   GLUCOSE, CAPILLARY     Status: Abnormal   Collection Time   08/15/11  5:02 PM      Component Value Range Comment   Glucose-Capillary 162 (*) 70 - 99 (mg/dL)   GLUCOSE, CAPILLARY     Status: Abnormal   Collection Time   08/15/11  9:48 PM      Component Value Range Comment   Glucose-Capillary 166 (*) 70 - 99 (mg/dL)   HEMOGLOBIN AND HEMATOCRIT, BLOOD     Status: Abnormal   Collection Time   08/15/11 11:25 PM      Component Value Range Comment   Hemoglobin 8.9 (*) 12.0 - 15.0 (g/dL)    HCT 27.5 (*) 36.0 - 46.0 (%)   CBC     Status: Abnormal   Collection Time   08/16/11  5:25 AM      Component Value Range Comment   WBC 4.7  4.0 - 10.5 (K/uL)    RBC 2.90 (*) 3.87 - 5.11 (MIL/uL)    Hemoglobin 9.0 (*) 12.0 - 15.0 (g/dL)    HCT 27.7 (*) 36.0 - 46.0 (%)    MCV 95.5  78.0 - 100.0 (fL)    MCH 31.0  26.0 - 34.0 (pg)    MCHC 32.5  30.0 - 36.0 (g/dL)    RDW 14.1  11.5 - 15.5 (%)    Platelets 118 (*) 150 - 400 (K/uL) PLATELET COUNT CONFIRMED BY SMEAR  HEPARIN LEVEL (UNFRACTIONATED)     Status: Abnormal   Collection Time   08/16/11  5:25 AM      Component Value Range Comment   Heparin Unfractionated <0.10 (*) 0.30 - 0.70 (IU/mL)   BASIC METABOLIC PANEL     Status: Abnormal   Collection Time   08/16/11  5:25 AM      Component Value Range Comment   Sodium 141  135 - 145 (mEq/L)    Potassium 4.4  3.5 - 5.1 (mEq/L)    Chloride 109  96 - 112 (mEq/L)    CO2 22  19 - 32 (mEq/L)    Glucose, Bld 106 (*) 70 - 99 (mg/dL)    BUN 48 (*) 6 - 23 (mg/dL)    Creatinine, Ser 1.63 (*) 0.50 - 1.10 (mg/dL)    Calcium 8.6  8.4 - 10.5 (mg/dL)    GFR calc non Af Amer 34 (*) >90 (mL/min)    GFR calc Af Amer 40 (*) >90 (mL/min)   GLUCOSE, CAPILLARY     Status: Abnormal   Collection Time   08/16/11  11:34 AM  Component Value Range Comment   Glucose-Capillary 127 (*) 70 - 99 (mg/dL)     Imaging: Nm Pulmonary Perfusion  08/14/2011  *RADIOLOGY REPORT*  Clinical Data: Shortness of breath.  NM PULMONARY PERFUSION PARTICULATE  Radiopharmaceutical: 6 millicuries technetium 53m MAA  Comparison: Plain film of the chest 08/13/2011 1540 hours.  Findings: Perfusion is mildly heterogeneous but no segmental or subsegmental defect is identified.  IMPRESSION: Very low probability for pulmonary embolus.  Original Report Authenticated By: Arvid Right. Luther Parody, M.D.    Assessment:  1. Active Problems: 2.  Chest pain 3.  Shortness of breath 4.  Insulin dependent diabetes mellitus 5.  Morbid obesity, (no history of sleep apnea evaluation) 6.  HTN (hypertension) 7.  HLD (hyperlipidemia) 8.  Anemia 9.  History of pulmonary embolism, recurrent, 3 seperate episodes. 10.  Presence of IVC filter, placed June 2011 after 3d PE 11.  Pulmonary HTN, Moderate  PA pressure 43mmHG 09/2009 12.  Sinus bradycardia 13.  Positive D dimer- 11.17 14.  Diastolic dysfunction, grade 2, EF 60% 2D June 2011 15.  LBBB (left bundle branch block) 16.  Anticoagulants causing adverse effect in therapeutic use, history of retinal bleeding on Coumadin 17.  Renal insufficiency 18.   Plan:  1.  Currently denies chest pain. D-dimer elevated, but V/Q is low probability. ?IVC filter thrombus, but no evidence for LE swelling. Crampy abdominal pain has resolved. GI recommends CT next week. Currently stable for floor transfer. Will review myoview when completed tomorrow. HCT has been stable. Creatinine is improving.  Time Spent Directly with Patient:  15 minutes  Length of Stay:  LOS: 3 days   Pixie Casino, MD, [facility] Attending Cardiologist [facility] 08/16/2011, 1:08 PM

## 2011-08-16 NOTE — Progress Notes (Signed)
ANTICOAGULATION CONSULT NOTE - Follow Up Consult  Pharmacy Consult for heparin Indication: chest pain/ACS and ?PE  Assessment: 14 yof started on heparin for chest pain and now thought to possibly have a PE D-dimer elevated but V/Q scan low probability. Pt does have a history of DVT and PE. Heparin has been off since 1800 on 5/10 2/2 rectal bleeding  Goal of Therapy:  Heparin level 0.3-0.7 units/ml Monitor platelets by anticoagulation protocol: Yes   Plan:  1. Heparin on hold for now - f/u plan    Gerrit Halls, PharmD [phone] 08/16/2011,1:20 PM  Vital Signs: Temp: 97.9 F (36.6 C) (05/11 1133) Temp src: Oral (05/11 1133) BP: 158/63 mmHg (05/11 1133) Pulse Rate: 66  (05/11 0941)  Labs:  Basename 08/16/11 0525 08/15/11 2325 08/15/11 1600 08/15/11 0847 08/15/11 0433 08/14/11 1227 08/14/11 0938 08/14/11 0300 08/13/11 2129 08/13/11 1500  HGB 9.0* 8.9* -- -- -- -- -- -- -- --  HCT 27.7* 27.5* 27.9* -- -- -- -- -- -- --  PLT 118* -- -- 122* 121* -- -- -- -- --  APTT -- -- -- -- -- -- -- -- -- --  LABPROT -- -- -- -- -- -- -- 14.3 -- 13.8  INR -- -- -- -- -- -- -- 1.09 -- 1.04  HEPARINUNFRC <0.10* -- -- -- 0.49 0.39 -- -- -- --  CREATININE 1.63* -- -- -- 1.88* -- -- 2.32* -- --  CKTOTAL -- -- -- -- -- -- 106 118 148 --  CKMB -- -- -- -- -- -- 3.0 3.4 3.6 --  TROPONINI -- -- -- -- -- -- <0.30 <0.30 <0.30 --    Estimated Creatinine Clearance: 52.1 ml/min (by C-G formula based on Cr of 1.63).  Medications:  Infusions:     . sodium chloride 75 mL/hr (08/16/11 0754)  . DISCONTD: heparin Stopped (08/15/11 1800)

## 2011-08-17 ENCOUNTER — Inpatient Hospital Stay (HOSPITAL_COMMUNITY): Payer: Medicaid Other

## 2011-08-17 LAB — HEMOGLOBIN AND HEMATOCRIT, BLOOD
HCT: 29.5 % — ABNORMAL LOW (ref 36.0–46.0)
Hemoglobin: 9.5 g/dL — ABNORMAL LOW (ref 12.0–15.0)

## 2011-08-17 LAB — CBC
HCT: 27.7 % — ABNORMAL LOW (ref 36.0–46.0)
Hemoglobin: 8.7 g/dL — ABNORMAL LOW (ref 12.0–15.0)
MCH: 30.1 pg (ref 26.0–34.0)
MCHC: 31.4 g/dL (ref 30.0–36.0)
MCV: 95.8 fL (ref 78.0–100.0)
Platelets: 105 10*3/uL — ABNORMAL LOW (ref 150–400)
RBC: 2.89 MIL/uL — ABNORMAL LOW (ref 3.87–5.11)
RDW: 14.2 % (ref 11.5–15.5)
WBC: 5.9 10*3/uL (ref 4.0–10.5)

## 2011-08-17 LAB — GLUCOSE, CAPILLARY
Glucose-Capillary: 178 mg/dL — ABNORMAL HIGH (ref 70–99)
Glucose-Capillary: 142 mg/dL — ABNORMAL HIGH (ref 70–99)

## 2011-08-17 MED ORDER — TECHNETIUM TC 99M TETROFOSMIN IV KIT
30.0000 | PACK | Freq: Once | INTRAVENOUS | Status: AC | PRN
  Administered 2011-08-17: 30 via INTRAVENOUS

## 2011-08-17 MED ORDER — TECHNETIUM TC 99M TETROFOSMIN IV KIT
30.0000 | PACK | Freq: Once | INTRAVENOUS | Status: AC | PRN
  Administered 2011-08-16: 30 via INTRAVENOUS

## 2011-08-17 MED ORDER — IOHEXOL 300 MG/ML  SOLN
72.0000 mL | Freq: Once | INTRAMUSCULAR | Status: AC | PRN
  Administered 2011-08-17: 72 mL via INTRAVENOUS

## 2011-08-17 MED ORDER — IOHEXOL 300 MG/ML  SOLN
20.0000 mL | INTRAMUSCULAR | Status: AC
  Administered 2011-08-17: 20 mL via ORAL

## 2011-08-17 NOTE — Progress Notes (Signed)
[facility]  DAILY PROGRESS NOTE   Subjective:  No cardiovascular events overnight. Second part of 2 day stress test today. Had back pain / sciatica last night and HR increased to 140 bpm (monitor shows sinus tachycardia). Otherwise sinus brady in 18s. GI evaluated for hematochezia. No further GI bleeding. Hgb hovering 8.7-9.7.  Objective:  Temp:  [97.8 F (36.6 C)-98.3 F (36.8 C)] 98.3 F (36.8 C) (05/11 2016) Pulse Rate:  [57-73] 57  (05/11 2016) Resp:  [14-18] 18  (05/11 2016) BP: (134-158)/(53-70) 134/70 mmHg (05/11 2016) SpO2:  [96 %-98 %] 96 % (05/11 2016) Weight change:   Intake/Output from previous day: 05/11 0701 - 05/12 0700 In: 1010 [P.O.:560; I.V.:450] Out: 600 [Urine:600]  Intake/Output from this shift:    Medications: Current Facility-Administered Medications  Medication Dose Route Frequency Provider Last Rate Last Dose  . 0.9 %  sodium chloride infusion   Intravenous Continuous Doreene Burke [place], [place] 75 mL/hr at 08/16/11 0754 75 mL/hr at 08/16/11 0754  . acetaminophen (TYLENOL) tablet 650 mg  650 mg Oral Q4H PRN Brett Canales, PA      . aspirin EC tablet 81 mg  81 mg Oral Daily Brett Canales, PA   81 mg at 08/16/11 1102  . citalopram (CELEXA) tablet 20 mg  20 mg Oral QHS Brett Canales, PA   20 mg at 08/16/11 2209  . diazepam (VALIUM) tablet 5 mg  5 mg Oral QHS PRN Brett Canales, PA   5 mg at 08/17/11 0356  . furosemide (LASIX) tablet 40 mg  40 mg Oral Daily Erlene Quan, [place]   40 mg at 08/16/11 1102  . HYDROcodone-acetaminophen (NORCO) 5-325 MG per tablet 1 tablet  1 tablet Oral Q6H PRN Brett Canales, PA   1 tablet at 08/17/11 0356  . insulin aspart (novoLOG) injection 0-9 Units  0-9 Units Subcutaneous TID WC Erlene Quan, PA   2 Units at 08/16/11 1741  . insulin glargine (LANTUS) injection 9 Units  9 Units Subcutaneous QHS Doreene Burke [place], [place]   9 Units at 08/16/11 2209  . levothyroxine (SYNTHROID, LEVOTHROID) tablet 50 mcg  50 mcg Oral  QAC breakfast Doreene Burke [place], [place]   50 mcg at 08/16/11 0754  . metoprolol tartrate (LOPRESSOR) tablet 25 mg  25 mg Oral BID Doreene Burke [place], [place]   25 mg at 08/16/11 1102  . omega-3 acid ethyl esters (LOVAZA) capsule 1 g  1 g Oral BID Brett Canales, PA   1 g at 08/16/11 2209  . ondansetron (ZOFRAN) injection 4 mg  4 mg Intravenous Q6H PRN Brett Canales, PA      . pantoprazole (PROTONIX) EC tablet 40 mg  40 mg Oral Q1200 Erlene Quan, [place]   40 mg at 08/16/11 1102    Physical Exam: General appearance: alert and no distress Neck: no adenopathy, no carotid bruit, no JVD, supple, symmetrical, trachea midline and thyroid not enlarged, symmetric, no tenderness/mass/nodules Lungs: clear to auscultation bilaterally Heart: regular rate and rhythm, S1, S2 normal, no murmur, click, rub or gallop Abdomen: soft, non-tender; bowel sounds normal; no masses,  no organomegaly Extremities: extremities normal, atraumatic, no cyanosis or edema Pulses: 2+ and symmetric  Lab Results: Results for orders placed during the hospital encounter of 08/13/11 (from the past 48 hour(s))  GLUCOSE, CAPILLARY     Status: Abnormal   Collection Time   08/15/11 11:33 AM      Component  Value Range Comment   Glucose-Capillary 150 (*) 70 - 99 (mg/dL)   HEMOGLOBIN AND HEMATOCRIT, BLOOD     Status: Abnormal   Collection Time   08/15/11  4:00 PM      Component Value Range Comment   Hemoglobin 9.1 (*) 12.0 - 15.0 (g/dL)    HCT 27.9 (*) 36.0 - 46.0 (%)   GLUCOSE, CAPILLARY     Status: Abnormal   Collection Time   08/15/11  5:02 PM      Component Value Range Comment   Glucose-Capillary 162 (*) 70 - 99 (mg/dL)   GLUCOSE, CAPILLARY     Status: Abnormal   Collection Time   08/15/11  9:48 PM      Component Value Range Comment   Glucose-Capillary 166 (*) 70 - 99 (mg/dL)   HEMOGLOBIN AND HEMATOCRIT, BLOOD     Status: Abnormal   Collection Time   08/15/11 11:25 PM      Component Value Range Comment   Hemoglobin 8.9 (*) 12.0 - 15.0 (g/dL)     HCT 27.5 (*) 36.0 - 46.0 (%)   CBC     Status: Abnormal   Collection Time   08/16/11  5:25 AM      Component Value Range Comment   WBC 4.7  4.0 - 10.5 (K/uL)    RBC 2.90 (*) 3.87 - 5.11 (MIL/uL)    Hemoglobin 9.0 (*) 12.0 - 15.0 (g/dL)    HCT 27.7 (*) 36.0 - 46.0 (%)    MCV 95.5  78.0 - 100.0 (fL)    MCH 31.0  26.0 - 34.0 (pg)    MCHC 32.5  30.0 - 36.0 (g/dL)    RDW 14.1  11.5 - 15.5 (%)    Platelets 118 (*) 150 - 400 (K/uL) PLATELET COUNT CONFIRMED BY SMEAR  HEPARIN LEVEL (UNFRACTIONATED)     Status: Abnormal   Collection Time   08/16/11  5:25 AM      Component Value Range Comment   Heparin Unfractionated <0.10 (*) 0.30 - 0.70 (IU/mL)   BASIC METABOLIC PANEL     Status: Abnormal   Collection Time   08/16/11  5:25 AM      Component Value Range Comment   Sodium 141  135 - 145 (mEq/L)    Potassium 4.4  3.5 - 5.1 (mEq/L)    Chloride 109  96 - 112 (mEq/L)    CO2 22  19 - 32 (mEq/L)    Glucose, Bld 106 (*) 70 - 99 (mg/dL)    BUN 48 (*) 6 - 23 (mg/dL)    Creatinine, Ser 1.63 (*) 0.50 - 1.10 (mg/dL)    Calcium 8.6  8.4 - 10.5 (mg/dL)    GFR calc non Af Amer 34 (*) >90 (mL/min)    GFR calc Af Amer 40 (*) >90 (mL/min)   GLUCOSE, CAPILLARY     Status: Abnormal   Collection Time   08/16/11  7:41 AM      Component Value Range Comment   Glucose-Capillary 101 (*) 70 - 99 (mg/dL)   GLUCOSE, CAPILLARY     Status: Abnormal   Collection Time   08/16/11 11:34 AM      Component Value Range Comment   Glucose-Capillary 127 (*) 70 - 99 (mg/dL)   HEMOGLOBIN AND HEMATOCRIT, BLOOD     Status: Abnormal   Collection Time   08/16/11  1:05 PM      Component Value Range Comment   Hemoglobin 9.7 (*) 12.0 - 15.0 (g/dL)  HCT 30.0 (*) 36.0 - 46.0 (%)   VITAMIN B12     Status: Normal   Collection Time   08/16/11  1:05 PM      Component Value Range Comment   Vitamin B-12 581  211 - 911 (pg/mL)   FOLATE     Status: Normal   Collection Time   08/16/11  1:05 PM      Component Value Range Comment    Folate >20.0     IRON AND TIBC     Status: Normal   Collection Time   08/16/11  1:05 PM      Component Value Range Comment   Iron 77  42 - 135 (ug/dL)    TIBC 373  250 - 470 (ug/dL)    Saturation Ratios 21  20 - 55 (%)    UIBC 296  125 - 400 (ug/dL)   FERRITIN     Status: Normal   Collection Time   08/16/11  1:05 PM      Component Value Range Comment   Ferritin 34  10 - 291 (ng/mL)   GLUCOSE, CAPILLARY     Status: Abnormal   Collection Time   08/16/11  4:35 PM      Component Value Range Comment   Glucose-Capillary 182 (*) 70 - 99 (mg/dL)   GLUCOSE, CAPILLARY     Status: Abnormal   Collection Time   08/16/11  8:24 PM      Component Value Range Comment   Glucose-Capillary 185 (*) 70 - 99 (mg/dL)    Comment 1 Notify RN     CBC     Status: Abnormal   Collection Time   08/17/11 12:44 AM      Component Value Range Comment   WBC 5.9  4.0 - 10.5 (K/uL)    RBC 2.89 (*) 3.87 - 5.11 (MIL/uL)    Hemoglobin 8.7 (*) 12.0 - 15.0 (g/dL)    HCT 27.7 (*) 36.0 - 46.0 (%)    MCV 95.8  78.0 - 100.0 (fL)    MCH 30.1  26.0 - 34.0 (pg)    MCHC 31.4  30.0 - 36.0 (g/dL)    RDW 14.2  11.5 - 15.5 (%)    Platelets 105 (*) 150 - 400 (K/uL) CONSISTENT WITH PREVIOUS RESULT  GLUCOSE, CAPILLARY     Status: Abnormal   Collection Time   08/17/11  6:09 AM      Component Value Range Comment   Glucose-Capillary 121 (*) 70 - 99 (mg/dL)    Comment 1 Notify RN       Imaging: No results found.  Assessment:  Active Problems:  Chest pain  Shortness of breath  Insulin dependent diabetes mellitus  Morbid obesity, (no history of sleep apnea evaluation)  HTN (hypertension)  HLD (hyperlipidemia)  Anemia  History of pulmonary embolism, recurrent, 3 seperate episodes.  Presence of IVC filter, placed June 2011 after 3d PE  Pulmonary HTN, Moderate  PA pressure 7mmHG 09/2009  Sinus bradycardia  Positive D dimer- [id number]  Diastolic dysfunction, grade 2, EF 60% 2D June 2011  LBBB (left bundle branch block)   Anticoagulants causing adverse effect in therapeutic use, history of retinal bleeding on Coumadin  Renal insufficiency  Rectal bleeding   Plan:  1.  Currently denies chest pain. D-dimer elevated, but V/Q is low probability. ?IVC filter thrombus, but no evidence for LE swelling. Crampy abdominal pain has resolved. GI recommends CT next week. Will review myoview when completed. HCT has been stable.  Creatinine is improving. May have to delay anticoagulation decision.  Time Spent Directly with Patient:  15 minutes  Length of Stay:  LOS: 4 days   Sanda Klein, MD, [place] [phone] office [phone] pager 08/17/2011 9:55 AM

## 2011-08-17 NOTE — Progress Notes (Signed)
I agree with the above documentation, including the assessment and plan. No further GI bleeding.  CT scan abd today. Further recs thereafter. Dr. Collene Mares to assume GI care tomorrow.

## 2011-08-17 NOTE — Progress Notes (Signed)
Patient ID: [name], female   DOB: Nov 16, 1955, 56 y.o.   MRN: [id number] [facility] Progress Note  Subjective: No chest pain, but c/o dyspnea and fatigue with any activity, sitting up etc. Abdomen still "sore", no further BM's or bleeding.  Objective:  Vital signs in last 24 hours: Temp:  [97.8 F (36.6 C)-98.3 F (36.8 C)] 98.3 F (36.8 C) (05/11 2016) Pulse Rate:  [57-78] 57  (05/11 2016) Resp:  [14-18] 18  (05/11 2016) BP: (123-158)/(53-70) 134/70 mmHg (05/11 2016) SpO2:  [96 %-98 %] 96 % (05/11 2016) Last BM Date: 08/15/11 General:   Alert,  Well-developed,    in NAD Heart:  Regular rate and rhythm; no murmurs Pulm;clear ant Abdomen:  Soft, tender LMQ and LLQ and nondistended. Normal bowel sounds, Extremities:  Stasis changes Neurologic:  Alert and  oriented x4;  grossly normal neurologically. Psych:  Alert and cooperative. Normal mood and affect.  Intake/Output from previous day: 05/11 0701 - 05/12 0700 In: 1010 [P.O.:560; I.V.:450] Out: 600 [Urine:600] Intake/Output this shift:    Lab Results:  Basename 08/17/11 0044 08/16/11 1305 08/16/11 0525 08/15/11 0847  WBC 5.9 -- 4.7 5.6  HGB 8.7* 9.7* 9.0* --  HCT 27.7* 30.0* 27.7* --  PLT 105* -- 118* 122*   BMET  Basename 08/16/11 0525 08/15/11 0433  NA 141 140  K 4.4 4.5  CL 109 108  CO2 22 24  GLUCOSE 106* 88  BUN 48* 58*  CREATININE 1.63* 1.88*  CALCIUM 8.6 8.7      Assessment / Plan: #1  56 yo female admitted with chest pain, and diaphoresis c/w  Angina- to complete Myoview today #2 Chronic anemia, and thrombocytopenia- r/o myelodyplasia- Fe studies are normal,  Needs heme consult at some point #3 hematochezia and LLQ pain, cramping while on NTG and Heparin- suspect she has had an ischemic colitis/segmental- no active bleeding since off heparin . Will order CT abd/pelvis for today. #4 morbid obesity #5 hx PE/DVTs-IVC filter Active Problems:  Chest pain  Shortness of breath  Insulin dependent diabetes mellitus  Morbid obesity, (no history of sleep apnea evaluation)  HTN (hypertension)  HLD (hyperlipidemia)  Anemia  History of pulmonary embolism, recurrent, 3 seperate episodes.  Presence of IVC filter, placed June 2011 after 3d PE  Pulmonary HTN, Moderate  PA pressure 67mmHG 09/2009  Sinus bradycardia  Positive D dimer- [id number]  Diastolic dysfunction, grade 2, EF 60% 2D June 2011  LBBB (left bundle branch block)  Anticoagulants causing adverse effect in therapeutic use, history of retinal bleeding on Coumadin  Renal insufficiency  Rectal bleeding     LOS: 4 days   Richelle Glick  08/17/2011, 9:21 AM

## 2011-08-18 ENCOUNTER — Inpatient Hospital Stay (HOSPITAL_COMMUNITY): Payer: Medicaid Other

## 2011-08-18 DIAGNOSIS — D638 Anemia in other chronic diseases classified elsewhere: Secondary | ICD-10-CM

## 2011-08-18 DIAGNOSIS — Z86718 Personal history of other venous thrombosis and embolism: Secondary | ICD-10-CM

## 2011-08-18 DIAGNOSIS — D696 Thrombocytopenia, unspecified: Secondary | ICD-10-CM | POA: Diagnosis not present

## 2011-08-18 DIAGNOSIS — N289 Disorder of kidney and ureter, unspecified: Secondary | ICD-10-CM

## 2011-08-18 LAB — BASIC METABOLIC PANEL
Calcium: 8.9 mg/dL (ref 8.4–10.5)
GFR calc Af Amer: 35 mL/min — ABNORMAL LOW (ref >90)
GFR calc non Af Amer: 30 mL/min — ABNORMAL LOW (ref >90)
Potassium: 4.3 mEq/L (ref 3.5–5.1)
Sodium: 137 mEq/L (ref 135–145)

## 2011-08-18 LAB — CBC
HCT: 26.9 % — ABNORMAL LOW (ref 36.0–46.0)
Hemoglobin: 8.7 g/dL — ABNORMAL LOW (ref 12.0–15.0)
MCH: 31.5 pg (ref 26.0–34.0)
MCHC: 32.3 g/dL (ref 30.0–36.0)
MCV: 97.5 fL (ref 78.0–100.0)
Platelets: 86 10*3/uL — ABNORMAL LOW (ref 150–400)
RBC: 2.76 MIL/uL — ABNORMAL LOW (ref 3.87–5.11)
RDW: 14.5 % (ref 11.5–15.5)
WBC: 5.3 10*3/uL (ref 4.0–10.5)

## 2011-08-18 LAB — GLUCOSE, CAPILLARY: Glucose-Capillary: 152 mg/dL — ABNORMAL HIGH (ref 70–99)

## 2011-08-18 LAB — PRO B NATRIURETIC PEPTIDE: Pro B Natriuretic peptide (BNP): 530.8 pg/mL — ABNORMAL HIGH (ref 0–125)

## 2011-08-18 LAB — SAVE SMEAR

## 2011-08-18 LAB — HEMOGLOBIN AND HEMATOCRIT, BLOOD
HCT: 26.5 % — ABNORMAL LOW (ref 36.0–46.0)
Hemoglobin: 8.5 g/dL — ABNORMAL LOW (ref 12.0–15.0)

## 2011-08-18 MED ORDER — POLYETHYLENE GLYCOL 3350 17 G PO PACK
17.0000 g | PACK | Freq: Two times a day (BID) | ORAL | Status: DC
  Filled 2011-08-18 (×2): qty 1

## 2011-08-18 MED ORDER — DOCUSATE SODIUM 100 MG PO CAPS
200.0000 mg | ORAL_CAPSULE | Freq: Every day | ORAL | Status: DC
  Administered 2011-08-19 – 2011-09-04 (×14): 200 mg via ORAL
  Filled 2011-08-18 (×17): qty 2

## 2011-08-18 MED ORDER — FUROSEMIDE 10 MG/ML IJ SOLN
40.0000 mg | Freq: Two times a day (BID) | INTRAMUSCULAR | Status: DC
  Administered 2011-08-18 – 2011-08-19 (×3): 40 mg via INTRAVENOUS
  Filled 2011-08-18 (×5): qty 4

## 2011-08-18 MED ORDER — POLYETHYLENE GLYCOL 3350 17 G PO PACK
17.0000 g | PACK | Freq: Two times a day (BID) | ORAL | Status: DC
  Administered 2011-08-18 – 2011-08-22 (×10): 17 g via ORAL
  Filled 2011-08-18 (×36): qty 1

## 2011-08-18 MED ORDER — FUROSEMIDE 80 MG PO TABS
80.0000 mg | ORAL_TABLET | Freq: Two times a day (BID) | ORAL | Status: DC
  Filled 2011-08-18 (×2): qty 1

## 2011-08-18 MED ORDER — DOCUSATE SODIUM 100 MG PO CAPS
100.0000 mg | ORAL_CAPSULE | Freq: Every day | ORAL | Status: DC
  Administered 2011-08-18: 100 mg via ORAL
  Filled 2011-08-18: qty 1

## 2011-08-18 MED ORDER — DILTIAZEM GEL 2 %
Freq: Three times a day (TID) | CUTANEOUS | Status: DC
  Administered 2011-08-18 – 2011-08-20 (×5): via TOPICAL
  Filled 2011-08-18: qty 30

## 2011-08-18 NOTE — Progress Notes (Signed)
Subjective: Since I last evaluated the patient, she has had rectal bleeding when she strains to facilitate a BM. She usually has 1 BM per day but since 08/14/11 she has had constipation with rectal bleeding. She had a colonoscopy in 2008 that revealed a few scattered diverticula but was otherwise normal.   Objective: Vital signs in last 24 hours: Temp:  [97.6 F (36.4 C)-98.3 F (36.8 C)] 97.6 F (36.4 C) (05/13 1408) Pulse Rate:  [48-62] 51  (05/13 1408) Resp:  [18-19] 18  (05/13 1408) BP: (122-143)/(64-76) 131/64 mmHg (05/13 1408) SpO2:  [94 %-100 %] 99 % (05/13 1408) Weight:  [134.8 kg (297 lb 2.9 oz)] 134.8 kg (297 lb 2.9 oz) (05/13 0444) Last BM Date: 08/15/11  Intake/Output from previous day:   Intake/Output this shift: Total I/O In: 600 [P.O.:600] Out: 1300 [Urine:1300]  General appearance: alert, cooperative, appears older than stated age, no distress and morbidly obese Resp: clear to auscultation bilaterally Cardio: regular rate and rhythm, S1, S2 normal, no murmur, click, rub or gallop GI: soft, non-tender; bowel sounds normal; no masses,  no organomegaly Extremities: extremities normal, atraumatic, no cyanosis or edema; a anal fissure was noted on inspection  Lab Results:  Basename 08/18/11 1133 08/18/11 0535 08/18/11 0027 08/17/11 0044 08/16/11 0525  WBC -- 5.3 -- 5.9 4.7  HGB 8.8* 8.7* 8.5* -- --  HCT 27.2* 26.9* 26.5* -- --  PLT -- 86* -- 105* 118*   BMET  Basename 08/18/11 1133 08/16/11 0525  NA 137 141  K 4.3 4.4  CL 105 109  CO2 23 22  GLUCOSE 150* 106*  BUN 46* 48*  CREATININE 1.81* 1.63*  CALCIUM 8.9 8.6   Studies/Results: Ct Abdomen Pelvis W Contrast  08/17/2011  *RADIOLOGY REPORT*  Clinical Data: Rectal bleeding, left lower quadrant pain, possible ischemic colitis.  CT ABDOMEN AND PELVIS WITH CONTRAST  Technique:  Multidetector CT imaging of the abdomen and pelvis was performed following the standard protocol during bolus administration of  intravenous contrast.  Contrast: 31mL OMNIPAQUE IOHEXOL 300 MG/ML  SOLN, 1 OMNIPAQUE IOHEXOL 300 MG/ML  SOLN  Comparison: CT abdomen 11/13/2009  Findings: Lung bases are clear.  No pericardial fluid.  No focal hepatic lesion.  Cholecystectomy clips in the gallbladder fossa.  The pancreas, spleen, adrenal glands, and kidneys are normal.  The stomach, small bowel, appendix, and cecum are normal.  Moderate volume stool throughout the colon.  Sigmoid colon and  rectum appear normal.  Abdominal aorta normal caliber.  There is an infrarenal IVC filter in place.  No free fluid in the pelvis.  The bladder and uterus are normal. No pelvic lymphadenopathy. Review of  bone windows demonstrates no aggressive osseous lesions. Degenerative osteophytosis of the spine and facet hypertrophy.  IMPRESSION:  1.  No acute abdominal or pelvic findings. 2.  No significant abnormality of the small bowel or colon. 3.  Infrarenal IVC filter noted.  Original Report Authenticated By: Suzy Bouchard, M.D.   Nm Myocar Multi W/spect W/wall Motion / Ef  08/17/2011  *RADIOLOGY REPORT*  Clinical Data:  Chest pain, dyspnea with exertion and diaphoresis. History of heart failure, pulmonary hypertension, diabetes, hypertension and hyperlipidemia.  MYOCARDIAL IMAGING WITH SPECT (REST AND PHARMACOLOGIC-STRESS - 2 DAY PROTOCOL) GATED LEFT VENTRICULAR WALL MOTION STUDY LEFT VENTRICULAR EJECTION FRACTION  Technique:  Standard myocardial SPECT imaging was performed after intravenous injection of 33 mCi Tc-84m tetrofosmin at rest.  On a different day, intravenous infusion of  Lexiscan was performed under supervision of the  Cardiology staff.  At peak effect of the drug, 33 mCi Tc-19m tetrofosmin was injected intravenously and standard myocardial SPECT imaging was performed.  Quantitative gated imaging was also performed to evaluate left ventricular wall motion and estimate left ventricular ejection fraction.  Comparison:  None.  Findings: Utilizing gated  data, the end-diastolic volume is estimated to be 114 ml and the end-systolic volume 39 ml. Calculated ejection fraction is 66%.  Gated wall motion analysis shows normal left ventricular wall motion.  SPECT imaging shows no evidence of inducible ischemia.  Both rest and stress exams show some degree of attenuation of the distal anterior wall which is felt to be likely due to overlying breast soft tissue attenuation.  IMPRESSION: No evidence of inducible ischemia.  Normal left ventricular function with quantitative ejection fraction calculation of 66%.  Original Report Authenticated By: Azzie Roup, M.D.   Dg Chest Port 1 View  08/18/2011  *RADIOLOGY REPORT*  Clinical Data: CHF  PORTABLE CHEST - 1 VIEW  Comparison: 08/13/2011  Findings: Cardiac enlargement.  Vascularity is normal.  Negative for heart failure or edema.  No infiltrate or effusion is present  IMPRESSION: No acute cardiopulmonary disease.  Original Report Authenticated By: Truett Perna, M.D.    Medications: I have reviewed the patient's current medications.  Assessment/Plan: 1) Rectal bleeding/ Constipation/ Anal fissure: Will have her apply Cardiazem 2% PR TID. Increase Docusate sodium to 100 mg 2 PO BID ans Miralx 17 gms TID PO. 2) Diverticulosis.  3) Anemia.   LOS: 5 days   Javonne Dorko 08/18/2011, 6:26 PM

## 2011-08-18 NOTE — Progress Notes (Signed)
[facility]  Subjective: Increased LEE.  +SOB/orthopnea, +abdominal pain.  Objective: Vital signs in last 24 hours: Temp:  [97.5 F (36.4 C)-98.3 F (36.8 C)] 97.6 F (36.4 C) (05/13 0444) Pulse Rate:  [48-58] 54  (05/13 0444) Resp:  [18-19] 19  (05/13 0444) BP: (122-143)/(59-76) 122/70 mmHg (05/13 0444) SpO2:  [94 %-100 %] 100 % (05/13 0444) Weight:  [134.8 kg (297 lb 2.9 oz)] 134.8 kg (297 lb 2.9 oz) (05/13 0444) Last BM Date: 08/15/11  Intake/Output from previous day:   Intake/Output this shift: Total I/O In: 240 [P.O.:240] Out: 700 [Urine:700]  Medications Current Facility-Administered Medications  Medication Dose Route Frequency Provider Last Rate Last Dose  . 0.9 %  sodium chloride infusion   Intravenous Continuous Doreene Burke [place], [place] 75 mL/hr at 08/16/11 0754 75 mL/hr at 08/16/11 0754  . acetaminophen (TYLENOL) tablet 650 mg  650 mg Oral Q4H PRN Brett Canales, PA      . aspirin EC tablet 81 mg  81 mg Oral Daily Brett Canales, PA   81 mg at 08/17/11 1429  . citalopram (CELEXA) tablet 20 mg  20 mg Oral QHS Brett Canales, PA   20 mg at 08/17/11 2208  . diazepam (VALIUM) tablet 5 mg  5 mg Oral QHS PRN Brett Canales, PA   5 mg at 08/17/11 2200  . docusate sodium (COLACE) capsule 100 mg  100 mg Oral Daily Brett Canales, PA      . furosemide (LASIX) tablet 40 mg  40 mg Oral Daily Doreene Burke [place], [place]   40 mg at 08/17/11 1429  . HYDROcodone-acetaminophen (NORCO) 5-325 MG per tablet 1 tablet  1 tablet Oral Q6H PRN Brett Canales, PA   1 tablet at 08/17/11 2200  . insulin aspart (novoLOG) injection 0-9 Units  0-9 Units Subcutaneous TID WC Erlene Quan, PA   2 Units at 08/17/11 1713  . insulin glargine (LANTUS) injection 9 Units  9 Units Subcutaneous QHS Doreene Burke [place], [place]   9 Units at 08/17/11 2200  . iohexol (OMNIPAQUE) 300 MG/ML solution 20 mL  20 mL Oral Q1 Hr x 2 Medication Radiologist, MD   20 mL at 08/17/11 1030  . iohexol (OMNIPAQUE) 300 MG/ML  solution 72 mL  72 mL Intravenous Once PRN Amy S [place], PA   72 mL at 08/17/11 1356  . levothyroxine (SYNTHROID, LEVOTHROID) tablet 50 mcg  50 mcg Oral QAC breakfast Doreene Burke [place], [place]   50 mcg at 08/17/11 1430  . metoprolol tartrate (LOPRESSOR) tablet 25 mg  25 mg Oral BID Doreene Burke [place], PA   25 mg at 08/17/11 1429  . omega-3 acid ethyl esters (LOVAZA) capsule 1 g  1 g Oral BID Brett Canales, PA   1 g at 08/17/11 2219  . ondansetron (ZOFRAN) injection 4 mg  4 mg Intravenous Q6H PRN Brett Canales, PA      . pantoprazole (PROTONIX) EC tablet 40 mg  40 mg Oral Q1200 Erlene Quan, [place]   40 mg at 08/16/11 1102    PE: General appearance: alert, cooperative and no distress Lungs: Decreased BS bilaterally, mild rales.  No Wheeze. Heart: regular rate and rhythm, S1, S2 normal, no murmur, click, rub or gallop Abdomen: Obese, +BS all quads.  Mod to severe tenderness throughout.  Greater in the LLQ.   Extremities: 2+ LEE Pulses: 2+ and symmetric  Lab Results:   Basename 08/18/11 0535 08/18/11 0027  08/17/11 1258 08/17/11 0044 08/16/11 0525  WBC 5.3 -- -- 5.9 4.7  HGB 8.7* 8.5* 9.5* -- --  HCT 26.9* 26.5* 29.5* -- --  PLT 86* -- -- 105* 118*   BMET  Basename 08/16/11 0525  NA 141  K 4.4  CL 109  CO2 22  GLUCOSE 106*  BUN 48*  CREATININE 1.63*  CALCIUM 8.6   MYOCARDIAL IMAGING WITH SPECT (REST AND PHARMACOLOGIC-STRESS - 2  DAY PROTOCOL)  GATED LEFT VENTRICULAR WALL MOTION STUDY  LEFT VENTRICULAR EJECTION FRACTION  Technique: Standard myocardial SPECT imaging was performed after  intravenous injection of 33 mCi Tc-2m tetrofosmin at rest. On a  different day, intravenous infusion of Lexiscan was performed  under supervision of the Cardiology staff. At peak effect of the  drug, 33 mCi Tc-52m tetrofosmin was injected intravenously and  standard myocardial SPECT imaging was performed. Quantitative  gated imaging was also performed to evaluate left ventricular wall  motion and estimate  left ventricular ejection fraction.  Comparison: None.  Findings: Utilizing gated data, the end-diastolic volume is  estimated to be 114 ml and the end-systolic volume 39 ml.  Calculated ejection fraction is 66%.  Gated wall motion analysis shows normal left ventricular wall  motion.  SPECT imaging shows no evidence of inducible ischemia. Both rest  and stress exams show some degree of attenuation of the distal  anterior wall which is felt to be likely due to overlying breast  soft tissue attenuation.  IMPRESSION:  No evidence of inducible ischemia. Normal left ventricular  function with quantitative ejection fraction calculation of 66%.   CT ABDOMEN AND PELVIS WITH CONTRAST  Technique: Multidetector CT imaging of the abdomen and pelvis was  performed following the standard protocol during bolus  administration of intravenous contrast.  Contrast: 46mL OMNIPAQUE IOHEXOL 300 MG/ML SOLN, 1 OMNIPAQUE  IOHEXOL 300 MG/ML SOLN  Comparison: CT abdomen 11/13/2009  Findings: Lung bases are clear. No pericardial fluid.  No focal hepatic lesion. Cholecystectomy clips in the gallbladder  fossa. The pancreas, spleen, adrenal glands, and kidneys are  normal.  The stomach, small bowel, appendix, and cecum are normal. Moderate  volume stool throughout the colon. Sigmoid colon and rectum  appear normal.  Abdominal aorta normal caliber. There is an infrarenal IVC filter  in place.  No free fluid in the pelvis. The bladder and uterus are normal.  No pelvic lymphadenopathy. Review of bone windows demonstrates no  aggressive osseous lesions. Degenerative osteophytosis of the spine  and facet hypertrophy.  IMPRESSION:  1. No acute abdominal or pelvic findings.  2. No significant abnormality of the small bowel or colon.  3. Infrarenal IVC filter noted.  Assessment/Plan  Active Problems:  Chest pain  Shortness of breath  Insulin dependent diabetes mellitus  Morbid obesity, (no history of sleep  apnea evaluation)  HTN (hypertension)  HLD (hyperlipidemia)  Anemia  History of pulmonary embolism, recurrent, 3 seperate episodes.  Presence of IVC filter, placed June 2011 after 3d PE  Pulmonary HTN, Moderate  PA pressure 57mmHG 09/2009  Sinus bradycardia  Positive D dimer- [id number]  Diastolic dysfunction, grade 2, EF 60% 2D June 2011  LBBB (left bundle branch block)  Anticoagulants causing adverse effect in therapeutic use, history of retinal bleeding on Coumadin  Renal insufficiency  Rectal bleeding Constipation  Plan:   No inducible ischemia on during nuc.  EF 66%.  Low probabilty of PE on V/Q.  Hgb Stable at 8.7.  No acute findings with CT abd/Pelvis with contrast.  Iron studies abd B12 WNL.    Weight increased by 5.5KG according to chart.  20# according to pt.  Increased Lasix to 40mg  IV BID.  Added Miralax.  Will call for Heme consult.  CXR, BMET, BNP    LOS: 5 days    Uday Jantz W 08/18/2011 10:02 AM

## 2011-08-18 NOTE — Consult Note (Signed)
[place] CONSULTATION NOTE  Reason for Consult: 1. Anemia                                     2. Thrombocytopenia  Requesting MD: Dr. Lyman Bishop (Cardiology) Primary Physician: [name], MD [facility])  [id number] D Constantine is a 56 y.o. white  female with a long history of anemia and thrombocytopenia per records, dating back to at least 2001 with prior UGI bleed, as well as a history of PE/DVT  intitially diagnosed on 2004 requiring anticoagulation and eventual  IVC Filter placemen 09/2009 after 4th   recurrence as well as retinal bleeding on coumadin , admitted on 08/13/2011 with  fatigue, shortness of breath, increased abdominal girth and chest pain (with negative troponins) .Patient was placed on heparin IV on admission, this d/c'd on 5/10 after she began to experience bright red blood per rectum. She has had no further rectal bleeding and no further bowel movements since 08-15-11. She had no rectal bleeding PTA and no other bleeding noted.  She was on ASA daily prior to hospitalization. Her hemoglobin was 10.4,dropping to 9.1 on 5/10 and to 9.0  on 5/11. Her Hgb on 5/13 is 8.8 with MCV of 97.5.  She has been on IV NS at 75 cc/hr from 08-14-11 until this was held last pm per patient (still listed as active order, but no IVF running now). Weight is charted as 276 on 08-13-11 (?self-reported?), then as 285 also on 08-13-11 and 297 today; I/O are not complete. Iron studies, folate and B12 all WNL this admission. LDH today is 238, reticulocytes uncorrected 1.9%, hemoccult negative on 08-13-11, creatinine 1.81 no bili or haptoglobin. She had colonoscopy by Dr Verdia Kuba 5 years ago which was normal by report, due again at 50 years.She was transfused PRBCs during childbirth 30 yrs ago. PT/INR 1.04 on 08-13-11. Per EMR, hemoglobin was 9.6 in June 2011 with platelets 102k. CT AP 08-17-11 reports normal spleen, no adenopathy, no focal liver lesions or bone lesions. CXR 08-13-11 cardiac enlargement,  minimal atelectasis otherwise not remarkable.   Platelets were 121 k on admission, later dropping to 118 K on 5/11 and 86k this am. Platelet antibody test is pending. Note normal spleen by CT as above.   WBC is in normal range, with unremarkable differential on 08-13-11.  Otherwise, D Dimer on 08/13/2011 is 11.17 (although low probability VQ scan seen), with pending fibrinogen levels.  IVC filter occlusion is being evaluated, for which IVC venogram may be needed. Thus were were requested to see patient with recommendations, to determine if procedure can be done with present counts.     PMH: Past Medical History  Diagnosis Date  . CHF (congestive heart failure), Grade 2 diastolic   . Diabetes mellitus, insulin dependent   . Arthritis   . Hypertension   . Depression   . DVT (deep venous thrombosis)   . PE (pulmonary thromboembolism) 2004        Pulmonary Hypertension       LBBB (left bundle branch block)       Sinus Bradycardia       Anemia with prior GIB       Hypothyroidism      Thrombocytopenia       EF 60-65 % June 2011        Morbid obesity Primary care is by Dr [name], whom she has seen  for > 7 yrs.   Pulmonary emboli reportedly x4, with IVC filter placed by IR  June 2011 reportedly after retinal hemorrhage while on anticoagulation. I cannot find hypercoagulable work up in EMR. Surgeries: Past Surgical History  Procedure Date  . Eye surgery   . Cholecystectomy 02/13/2000  . Cesarean section   . Ivc     ivc filter                                                                                                            09/2009    Allergies:  Allergies  Allergen Reactions  . Morphine And Related Rash    Medications:  Prior to Admission:  Prescriptions prior to admission  Medication Sig Dispense Refill  . aspirin EC 81 MG tablet Take 81 mg by mouth daily.      . citalopram (CELEXA) 20 MG tablet Take 20 mg by mouth at bedtime.      . diazepam (VALIUM) 5 MG tablet  Take 5 mg by mouth at bedtime as needed. For muscle spasms.      . furosemide (LASIX) 80 MG tablet Take 80 mg by mouth 2 (two) times daily.      Marland Kitchen glimepiride (AMARYL) 4 MG tablet Take 4 mg by mouth daily before breakfast.      . HYDROcodone-acetaminophen (VICODIN) 5-500 MG per tablet Take 1 tablet by mouth every 8 (eight) hours as needed. For pain.      Marland Kitchen insulin glargine (LANTUS) 100 UNIT/ML injection Inject 17-21 Units into the skin See admin instructions. She uses a sliding scale.      . levothyroxine (SYNTHROID, LEVOTHROID) 25 MCG tablet Take 25 mcg by mouth daily.      . metFORMIN (GLUCOPHAGE) 1000 MG tablet Take 1,000 mg by mouth 2 (two) times daily with a meal.      . metoprolol (LOPRESSOR) 50 MG tablet Take 50 mg by mouth 2 (two) times daily.      . Multiple Vitamin (MULITIVITAMIN WITH MINERALS) TABS Take 1 tablet by mouth daily.      Marland Kitchen olmesartan (BENICAR) 20 MG tablet Take 20 mg by mouth daily.      . Omega-3 Fatty Acids (FISH OIL) 1200 MG CAPS Take 1 capsule by mouth 2 (two) times daily.      Marland Kitchen OVER THE COUNTER MEDICATION Take 3 tablets by mouth 2 (two) times daily. Raspberry Ketones.      Marland Kitchen OVER THE COUNTER MEDICATION Take 2 tablets by mouth daily. Cherry 1000mg  tablets.      Marland Kitchen spironolactone (ALDACTONE) 25 MG tablet Take 25 mg by mouth daily.        [id number], diazepam, HYDROcodone-acetaminophen, iohexol, ondansetron (ZOFRAN) IV  as outpatient she had been taking an over the counter med "as recommended by Dr. Irena Cords", Edwinna Areola, as well as Marcelline Mates pills, which side effects will be evaluated.   Of meds PTA, celexa and amaryl rarely can be associated with anemia and thrombocytopenia.  ROS: Constitutional: Positive for 20 lb  weight gain on admission due to  CHF. Of note, patient was on a diet, had lost 20 lbs a few weeks prior to admission, and had reduced her meat intake.  Negative for fever, chills and positive for  Increasing malaise/fatigue. Denies any gum or  nosebleed.  Eyes: Negative for blurred vision and double vision.  Respiratory: denies cough, sputum, hemoptysis and denies shortness of breath seated in bed on RA now, but is SOB with exertion such as walking to BR. .  Cardiovascular: Positive  for chest pain during admission. GI: Positive for  nausea,  No vomiting, No diarrhea or constipation. No change in bowel caliber. Had bright red blood per rectum after admission when on heparin. No melena. Had abdominal pain with increased girth which now improved.  GU: No blood in urine. No loss of urinary control. Skin: Negative for itching. No rash. No petechia. No bruising Neurological: No headaches. No motor or sensory deficits.Had dizziness on admission.  Musculoskeletal pain secondary to OA of the knees and back  Family History:  Father's medical history unknown, he was shot before she was born. Mother died with aneurysm rupture. Grandfather died with brain aneurysm,   Social History:  reports that she has been passively smoking for many years. She smoked 2-3 cigarettes after a meal, not on a regular basis.   She does not have any smokeless tobacco history on file. She reports that she does not drink alcohol or use illicit drugs. Patient is divorced, one grown son in good health. 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 Prior Conservator, museum/gallery.   Physical Exam  56 year old wf in no acute distress A. and O. x3 General well-developed and well-nourished  HEENT: Normocephalic, atraumatic, PERRLA. Oral cavity without thrush or lesions. Neck supple. no thyromegaly, no cervical or supraclavicular adenopathy  Lungs clear bilaterally . No wheezing, rhonchi or rales. No axillary masses. Breasts: not examined. Cardiac regular rate and rhythm, 1/6 systolic murmur , rubs or gallops Abdomen morbidly  obese.  soft nontender , bowel sounds x4. No HSM GU/rectal: deferred. Extremities no clubbing cyanosis. 2+ edema L>R. No bruising or petechial rash. Chronic venous  stasis seen bilateral LE.     Labs:  CBC   Lab 08/18/11 1133 08/18/11 0535 08/18/11 0027 08/17/11 1258 08/17/11 0044 08/16/11 0525 08/15/11 0847 08/15/11 0433 08/13/11 1500  WBC -- 5.3 -- -- 5.9 4.7 5.6 4.9 --  HGB 8.8* 8.7* 8.5* 9.5* 8.7* -- -- -- --  HCT 27.2* 26.9* 26.5* 29.5* 27.7* -- -- -- --  PLT -- 86* -- -- 105* 118* 122* 121* --  MCV -- 97.5 -- -- 95.8 95.5 95.9 95.5 --  MCH -- 31.5 -- -- 30.1 31.0 30.9 31.3 --  MCHC -- 32.3 -- -- 31.4 32.5 32.2 32.7 --  RDW -- 14.5 -- -- 14.2 14.1 13.9 13.9 --  LYMPHSABS -- -- -- -- -- -- -- -- 0.9  MONOABS -- -- -- -- -- -- -- -- 0.3  EOSABS -- -- -- -- -- -- -- -- 0.2  BASOSABS -- -- -- -- -- -- -- -- 0.0  BANDABS -- -- -- -- -- -- -- -- --     Anemia panel: as above   Basename 08/16/11 1305  VITAMINB12 581  FOLATE >20.0  FERRITIN 34  TIBC 373  IRON 77  RETICCTPCT --   Peripheral blood smear reviewed: no schistocytes, teardrops, targets or rouleaux. 1-2 NRBC. Normal size platelets. No hypersegmented neutrophils. No immature WBCs.     CMP  No full CMET done this admission  Lab 08/18/11 1133 08/16/11 0525 08/15/11 0433 08/14/11 0300 08/13/11 1500  NA 137 141 140 140 137  K 4.3 4.4 4.5 4.6 4.9  CL 105 109 108 105 99  CO2 23 22 24 25 25   GLUCOSE 150* 106* 88 92 200*  BUN 46* 48* 58* 69* 69*  CREATININE 1.81* 1.63* 1.88* 2.32* 2.32*  CALCIUM 8.9 8.6 8.7 9.3 9.1  MG -- -- -- -- --  AST -- -- -- -- --  ALT -- -- -- -- --  ALKPHOS -- -- -- -- --  BILITOT -- -- -- -- --        Component Value Date/Time   BILITOT 0.6 10/01/2009 0520   BILIDIR 0.1 10/01/2009 0244   IBILI 0.7 10/01/2009 0244       Lab 08/14/11 0300 08/13/11 1500  INR 1.09 1.04  PROTIME -- --     Imaging Studies:  Ct Abdomen Pelvis W Contrast  08/17/2011  *RADIOLOGY REPORT*  Clinical Data: Rectal bleeding, left lower quadrant pain, possible ischemic colitis.  CT ABDOMEN AND PELVIS WITH CONTRAST  Technique:  Multidetector CT imaging of the  abdomen and pelvis was performed following the standard protocol during bolus administration of intravenous contrast.  Contrast: 18mL OMNIPAQUE IOHEXOL 300 MG/ML  SOLN, 1 OMNIPAQUE IOHEXOL 300 MG/ML  SOLN  Comparison: CT abdomen 11/13/2009  Findings: Lung bases are clear.  No pericardial fluid.  No focal hepatic lesion.  Cholecystectomy clips in the gallbladder fossa.  The pancreas, spleen, adrenal glands, and kidneys are normal.  The stomach, small bowel, appendix, and cecum are normal.  Moderate volume stool throughout the colon.  Sigmoid colon and  rectum appear normal.  Abdominal aorta normal caliber.  There is an infrarenal IVC filter in place.  No free fluid in the pelvis.  The bladder and uterus are normal. No pelvic lymphadenopathy. Review of  bone windows demonstrates no aggressive osseous lesions. Degenerative osteophytosis of the spine and facet hypertrophy.  IMPRESSION:  1.  No acute abdominal or pelvic findings. 2.  No significant abnormality of the small bowel or colon. 3.  Infrarenal IVC filter noted.  Original Report Authenticated By: Suzy Bouchard, M.D.   Nm Myocar Multi W/spect W/wall Motion / Ef  08/17/2011  *RADIOLOGY REPORT*  Clinical Data:  Chest pain, dyspnea with exertion and diaphoresis. History of heart failure, pulmonary hypertension, diabetes, hypertension and hyperlipidemia.  MYOCARDIAL IMAGING WITH SPECT (REST AND PHARMACOLOGIC-STRESS - 2 DAY PROTOCOL) GATED LEFT VENTRICULAR WALL MOTION STUDY LEFT VENTRICULAR EJECTION FRACTION  Technique:  Standard myocardial SPECT imaging was performed after intravenous injection of 33 mCi Tc-65m tetrofosmin at rest.  On a different day, intravenous infusion of  Lexiscan was performed under supervision of the Cardiology staff.  At peak effect of the drug, 33 mCi Tc-51m tetrofosmin was injected intravenously and standard myocardial SPECT imaging was performed.  Quantitative gated imaging was also performed to evaluate left ventricular wall motion  and estimate left ventricular ejection fraction.  Comparison:  None.  Findings: Utilizing gated data, the end-diastolic volume is estimated to be 114 ml and the end-systolic volume 39 ml. Calculated ejection fraction is 66%.  Gated wall motion analysis shows normal left ventricular wall motion.  SPECT imaging shows no evidence of inducible ischemia.  Both rest and stress exams show some degree of attenuation of the distal anterior wall which is felt to be likely due to overlying breast soft tissue attenuation.  IMPRESSION: No evidence of inducible ischemia.  Normal left ventricular function with quantitative  ejection fraction calculation of 66%.  Original Report Authenticated By: Azzie Roup, M.D.   Dg Chest Port 1 View  08/18/2011  *RADIOLOGY REPORT*  Clinical Data: CHF  PORTABLE CHEST - 1 VIEW  Comparison: 08/13/2011  Findings: Cardiac enlargement.  Vascularity is normal.  Negative for heart failure or edema.  No infiltrate or effusion is present  IMPRESSION: No acute cardiopulmonary disease.  Original Report Authenticated By: Truett Perna, M.D.        A/P: 56 y.o. female asked to see for evaluation of anemia and thrombocytopenia in the setting of recurrent PE/DVT, polypharmacy, acute illness, chronic illness including CRI , CHF and pulmonary hypertension among her extensive medical issues. A smear has been ordered for review, r/o schistocytes, abnormal RBC morphology, etc. Check retic count and LDH. Dr.Emory Gallentine is to see the patient following this consult with recommendations regarding further workup studies (i.e hypercoagulable pane) Thank you for the referral.  [facility] E 08/18/2011 12:30 PM   Patient seen and examined, history and EMR information back to 2011 reviewed. I have edited above note now.   Assessment/ Recommendations: 1.Anemia: likely multifactorial with IVF since admission, some blood loss with rectal bleeding when on heparin, chronic renal insufficiency and chronic  disease. Would follow with IVF off/ on lasix.  2.thrombocytopenia: dates back at least to 2011, platelets a little lower now. Follow up HIT test pending. Smear does not appear to be ITP and no splenomegaly. 3.history of recurrent PEs prior to placement of IVC filter in 2011. She has obvious risk factors for PE with morbid obesity and immobility, but does not seem to have had hypercoagulable work up. This may be appropriate after DC/ out further from recent heparin if primary MD does not have additional information in this regard.  4.Chronic renal insufficiency x several years.  5.diabetes 6.morbid obesity 7.CHF, HTN 8.depression  From standpoint of venogram, I would be more concerned about her renal insufficiency than her counts. I will follow up when other labs are resulted. Please call if needed prior to my next visit.  Evlyn Clines, MD [phone]

## 2011-08-18 NOTE — Progress Notes (Signed)
Pt. Seen and examined. Agree with the NP/PA-C note as written.  Nuclear study does not show ischemia. CT abdomen and pelvis was unremarkable. She is having progressive LE edema and weight gain. She does have a history of IVC filter in the past. D-dimer is elevated, but V/Q was negative.  There is continued anemia and what appears to be pancytopenia today with platelets that are trending down. Heparin was discontinued. Will check PF4 study. Agree with heme consult. I wonder if she has IVC filter thrombus. Discussed with radiology, best way to r/o filter occlusion would be IVC venogram. ?can we do this with low platelet count and renal failure.  Pixie Casino, MD, [facility] Attending Cardiologist [facility]

## 2011-08-19 LAB — DIFFERENTIAL
Basophils Relative: 1 % (ref 0–1)
Eosinophils Absolute: 0.3 10*3/uL (ref 0.0–0.7)
Monocytes Relative: 9 % (ref 3–12)
Neutrophils Relative %: 69 % (ref 43–77)

## 2011-08-19 LAB — CBC
HCT: 26.3 % — ABNORMAL LOW (ref 36.0–46.0)
Hemoglobin: 8.6 g/dL — ABNORMAL LOW (ref 12.0–15.0)
Hemoglobin: 8.8 g/dL — ABNORMAL LOW (ref 12.0–15.0)
MCH: 31.4 pg (ref 26.0–34.0)
MCH: 31.9 pg (ref 26.0–34.0)
MCHC: 32.7 g/dL (ref 30.0–36.0)
MCHC: 33.1 g/dL (ref 30.0–36.0)
MCV: 96 fL (ref 78.0–100.0)
Platelets: 83 10*3/uL — ABNORMAL LOW (ref 150–400)
RBC: 2.74 MIL/uL — ABNORMAL LOW (ref 3.87–5.11)
RDW: 14.5 % (ref 11.5–15.5)
WBC: 5.8 10*3/uL (ref 4.0–10.5)

## 2011-08-19 LAB — GLUCOSE, CAPILLARY
Glucose-Capillary: 152 mg/dL — ABNORMAL HIGH (ref 70–99)
Glucose-Capillary: 131 mg/dL — ABNORMAL HIGH (ref 70–99)

## 2011-08-19 LAB — COMPREHENSIVE METABOLIC PANEL
BUN: 54 mg/dL — ABNORMAL HIGH (ref 6–23)
Calcium: 8.9 mg/dL (ref 8.4–10.5)
Creatinine, Ser: 2.05 mg/dL — ABNORMAL HIGH (ref 0.50–1.10)
GFR calc Af Amer: 30 mL/min — ABNORMAL LOW (ref >90)
Glucose, Bld: 188 mg/dL — ABNORMAL HIGH (ref 70–99)
Sodium: 136 mEq/L (ref 135–145)
Total Protein: 6.8 g/dL (ref 6.0–8.3)

## 2011-08-19 LAB — HEMOGLOBIN AND HEMATOCRIT, BLOOD: HCT: 25.9 % — ABNORMAL LOW (ref 36.0–46.0)

## 2011-08-19 MED ORDER — FUROSEMIDE 40 MG PO TABS
40.0000 mg | ORAL_TABLET | Freq: Every day | ORAL | Status: DC
  Administered 2011-08-20 – 2011-08-23 (×4): 40 mg via ORAL
  Filled 2011-08-19 (×5): qty 1

## 2011-08-19 MED ORDER — BISACODYL 10 MG RE SUPP
10.0000 mg | Freq: Every day | RECTAL | Status: DC | PRN
  Administered 2011-08-20: 10 mg via RECTAL
  Filled 2011-08-19: qty 1

## 2011-08-19 MED ORDER — BISACODYL 5 MG PO TBEC
5.0000 mg | DELAYED_RELEASE_TABLET | Freq: Every day | ORAL | Status: DC | PRN
  Administered 2011-08-19: 5 mg via ORAL
  Filled 2011-08-19: qty 1

## 2011-08-19 MED ORDER — POLYETHYLENE GLYCOL 3350 17 G PO PACK: 17.0000 g | PACK | Freq: Every day | ORAL | Status: DC

## 2011-08-19 NOTE — Progress Notes (Signed)
Subjective:  C/O constipation  Objective:  Vital Signs in the last 24 hours: Temp:  [97.6 F (36.4 C)-98.1 F (36.7 C)] 98.1 F (36.7 C) (05/14 0430) Pulse Rate:  [51-67] 67  (05/14 0941) Resp:  [18] 18  (05/14 0430) BP: (130-143)/(64-72) 130/67 mmHg (05/14 0430) SpO2:  [96 %-99 %] 96 % (05/14 0430) Weight:  [133.3 kg (293 lb 14 oz)] 133.3 kg (293 lb 14 oz) (05/14 0430)  Intake/Output from previous day:  Intake/Output Summary (Last 24 hours) at 08/19/11 1104 Last data filed at 08/19/11 0600  Gross per 24 hour  Intake    960 ml  Output    950 ml  Net     10 ml    Physical Exam: General appearance: alert, cooperative, no distress and morbidly obese Lungs: clear to auscultation bilaterally Heart: regular rate and rhythm   Rate: 70  Rhythm: normal sinus rhythm and periods of AF with CVR, LBBB  Lab Results:  Basename 08/19/11 0500 08/18/11 1133 08/18/11 0535  WBC 5.8 -- 5.3  HGB 8.6* 8.8* --  PLT 83* -- 86*    Basename 08/18/11 1133  NA 137  K 4.3  CL 105  CO2 23  GLUCOSE 150*  BUN 46*  CREATININE 1.81*   No results found for this basename: TROPONINI:2,CK,MB:2 in the last 72 hours Hepatic Function Panel No results found for this basename: PROT,ALBUMIN,AST,ALT,ALKPHOS,BILITOT,BILIDIR,IBILI in the last 72 hours No results found for this basename: CHOL in the last 72 hours No results found for this basename: INR in the last 72 hours  Imaging: Imaging results have been reviewed  Cardiac Studies:  Assessment/Plan:   Active Problems:  Chest pain  Shortness of breath  Positive D dimer- 11.17  Insulin dependent diabetes mellitus  History of pulmonary embolism, recurrent, 3 seperate episodes.  Diastolic dysfunction, grade 2, EF 60% 2D June 2011  Anticoagulants causing adverse effect in therapeutic use, history of retinal bleeding on Coumadin  Renal insufficiency  Morbid obesity, (no history of sleep apnea evaluation)  HTN (hypertension)  HLD  (hyperlipidemia)  Anemia  Presence of IVC filter, placed June 2011 after 3d PE  Pulmonary HTN, Moderate  PA pressure 6mmHG 09/2009  Sinus bradycardia  LBBB (left bundle branch block)  Rectal bleeding  Thrombocytopenia   Plan- Hgb drifting down, SCr drifting up. AF but not fast and not symptomatic. MD to see, not on any anticoagulation currently secondary to rectal bleeding and pancytopenia. BNP 530 yesterday, will change Lasix to PO and decrease dose.  Kerin Ransom PA-C 08/19/2011, 11:04 AM  I have seen and examined the patient along with Kerin Ransom PA-C.  I have reviewed the chart, notes and new data.  I agree with PA's note.  Key new complaints: constipation, leg pain and lumbar pain are ongoing issues Key examination changes: none; no overt bleeding; edema largely the same Key new findings / data: persistent (stable) anemia and thrombocytopenia.  PLAN: Avoid any heparin or other drugs that may cause depressed platelet counts (e.g. H2 blockers, etc.). Laxatives, stool softeners and topical diltiazem for rectal fissure. Unfortunately analgetics may worsen constipation.  Sanda Klein, MD, [place] [phone] 08/19/2011, 6:07 PM

## 2011-08-19 NOTE — Progress Notes (Signed)
Medical Oncology  Patient sleeping quietly up in chair now.  EMR reviewed. Exam by Dr.Mann showed rectal fissure as source of recent rectal bleeding.  CBC this AM essentially stable, with WBC 5.8, Hgb 8.6 and plt 83. Platelet antibody tests pending. I/O charted 1200/1650 and weight charted down 3 lbs in past day, to 294.  Would follow counts for now. Could transfuse PRBCs if symptomatic and platelets if significant bleeding. Would avoid heparin products.  Please call if needed prior to my next rounds.  Evlyn Clines, MD [phone]

## 2011-08-20 LAB — CBC
HCT: 26 % — ABNORMAL LOW (ref 36.0–46.0)
Hemoglobin: 8.7 g/dL — ABNORMAL LOW (ref 12.0–15.0)
MCH: 31.8 pg (ref 26.0–34.0)
MCHC: 33.5 g/dL (ref 30.0–36.0)
MCV: 94.9 fL (ref 78.0–100.0)
Platelets: 96 10*3/uL — ABNORMAL LOW (ref 150–400)
RBC: 2.74 MIL/uL — ABNORMAL LOW (ref 3.87–5.11)
RDW: 14.4 % (ref 11.5–15.5)
WBC: 6.3 10*3/uL (ref 4.0–10.5)

## 2011-08-20 LAB — BASIC METABOLIC PANEL
BUN: 55 mg/dL — ABNORMAL HIGH (ref 6–23)
BUN: 57 mg/dL — ABNORMAL HIGH (ref 6–23)
CO2: 20 mEq/L (ref 19–32)
CO2: 22 mEq/L (ref 19–32)
Calcium: 8.9 mg/dL (ref 8.4–10.5)
Calcium: 8.8 mg/dL (ref 8.4–10.5)
Chloride: 104 mEq/L (ref 96–112)
Chloride: 103 mEq/L (ref 96–112)
Creatinine, Ser: 2.03 mg/dL — ABNORMAL HIGH (ref 0.50–1.10)
Creatinine, Ser: 2.05 mg/dL — ABNORMAL HIGH (ref 0.50–1.10)
GFR calc Af Amer: 31 mL/min — ABNORMAL LOW (ref >90)
GFR calc Af Amer: 30 mL/min — ABNORMAL LOW (ref >90)
GFR calc non Af Amer: 26 mL/min — ABNORMAL LOW (ref >90)
GFR calc non Af Amer: 26 mL/min — ABNORMAL LOW (ref >90)
Glucose, Bld: 161 mg/dL — ABNORMAL HIGH (ref 70–99)
Glucose, Bld: 188 mg/dL — ABNORMAL HIGH (ref 70–99)
Potassium: 4.8 mEq/L (ref 3.5–5.1)
Potassium: 5.3 mEq/L — ABNORMAL HIGH (ref 3.5–5.1)
Sodium: 138 mEq/L (ref 135–145)
Sodium: 134 mEq/L — ABNORMAL LOW (ref 135–145)

## 2011-08-20 LAB — GLUCOSE, CAPILLARY
Glucose-Capillary: 177 mg/dL — ABNORMAL HIGH (ref 70–99)
Glucose-Capillary: 202 mg/dL — ABNORMAL HIGH (ref 70–99)
Glucose-Capillary: 169 mg/dL — ABNORMAL HIGH (ref 70–99)

## 2011-08-20 MED ORDER — ATROPINE SULFATE 0.4 MG/ML IJ SOLN
0.4000 mg | Freq: Once | INTRAMUSCULAR | Status: AC
  Administered 2011-08-20: 0.4 mg via INTRAVENOUS
  Filled 2011-08-20: qty 1

## 2011-08-20 NOTE — Progress Notes (Signed)
Called after pt's HR dropped to 20-40 after she had bowel movement. She had received her beta blocker already this am. She is still bradycardic after 15-60min will Rx with 1/2 amp Atropine and Tx to CCU.  LK

## 2011-08-20 NOTE — Progress Notes (Signed)
telemetry reported that patient was brady in the 30s. Rush into patients room she was alert and oriented, calm but did complain of feeling light headed. whille observing , rate dropped to the 20s and bounce back up to the 40s. PA luke was page, order for EKG, and atropine and transfer of patient to the unit. through out the episode , patient remained calm, alert and full oriented. With the exception of feeling light headed, she denies any other symptom. B/p was 138/75  , was 94 on room air.

## 2011-08-20 NOTE — Progress Notes (Signed)
I agree with this COA.   The patient will be seen in CCU.  Leonie Man, M.D., M.S. [facility] [address] [place], [place]  [address]  [phone] Pager # [phone]  08/20/2011 1:03 PM

## 2011-08-20 NOTE — Progress Notes (Signed)
Patient had some nausea.  Patient was given ginger ale which she stated helped. Patient was also given IV Zofran per MD order. Will continue to monitor.

## 2011-08-20 NOTE — Progress Notes (Signed)
Subjective:  Large BM yesterday, up in room.   Objective:  Vital Signs in the last 24 hours: Temp:  [98.6 F (37 C)-99 F (37.2 C)] 99 F (37.2 C) (05/15 0435) Pulse Rate:  [52-56] 56  (05/15 0435) Resp:  [16-18] 18  (05/15 0435) BP: (105-152)/(58-79) 135/67 mmHg (05/15 0435) SpO2:  [96 %-98 %] 96 % (05/15 0435) Weight:  [134.9 kg (297 lb 6.4 oz)] 134.9 kg (297 lb 6.4 oz) (05/15 0435)  Intake/Output from previous day:  Intake/Output Summary (Last 24 hours) at 08/20/11 0946 Last data filed at 08/20/11 0723  Gross per 24 hour  Intake    540 ml  Output      0 ml  Net    540 ml    Physical Exam: General appearance: alert, cooperative, no distress and morbidly obese Lungs: decreased breath sounds Lt base Heart: irregularly irregular rhythm 2+ chronic LE edema Abd: soft, NT, ND, NABS - post BM   Rate: 60  Rhythm: NSR, LBBB  Lab Results:  Basename 08/20/11 0500 08/19/11 1743  WBC 6.3 5.9  HGB 8.7* 8.8*  PLT 96* 81*    Basename 08/20/11 0500 08/19/11 1743  NA 138 136  K 4.8 4.6  CL 104 104  CO2 20 21  GLUCOSE 161* 188*  BUN 55* 54*  CREATININE 2.03* 2.05*   No results found for this basename: TROPONINI:2,CK,MB:2 in the last 72 hours Hepatic Function Panel  Basename 08/19/11 1743  PROT 6.8  ALBUMIN 3.4*  AST 22  ALT 26  ALKPHOS 59  BILITOT 0.4  BILIDIR --  IBILI --   No results found for this basename: CHOL in the last 72 hours No results found for this basename: INR in the last 72 hours  Imaging: Imaging results have been reviewed  Cardiac Studies:  Assessment/Plan:   Active Problems:  Chest pain, MI r/o   Shortness of breath, BNP 400-500  Positive D dimer- 11.17, VQ low prob, LE venous doppler neg  Insulin dependent diabetes mellitus  History of pulmonary embolism, recurrent, 3 seperate episodes.  Diastolic dysfunction, grade 2, EF 60% 2D June 2011  Anticoagulants causing adverse effect in therapeutic use, history of retinal bleeding on  Coumadin  Renal insufficiency  Thrombocytopenia, suspected HIT  Morbid obesity, (no history of sleep apnea evaluation)  HTN (hypertension)  HLD (hyperlipidemia)  Anemia  Presence of IVC filter, placed June 2011 after 3d PE  Pulmonary HTN, Moderate  PA pressure 77mmHG 09/2009  Sinus bradycardia  LBBB (left bundle branch block)  Rectal bleeding, GI w/u- rectal fissure  Plan-MD to see. Home soon?-(her wgt is actually up from admission 129.4-134.9)   Kerin Ransom PA-C 08/20/2011, 9:46 AM  ATTENDING ATTESTATION:  I have seen and examined the patient along with Kerin Ransom, PA.  I have reviewed the chart, notes and new data.  I agree with Luke's note with the exception that, the patient had a profound vagal episode following a large BM this AM followed by profound bradycardia. HR currently in mid 10s.  No further issues.   She is on PR Diltiazem -- that can be absorbed & act systemically.  Awaiting PF4 Antibodies.  IVC Venogram not performed (not sure of the reasons why).  Monitor in CCU overnight to ensure HR is stable, -- I suspect that she is bradycardic chronically, but has proven that she can mount a tachycardic response. No acute need for PPM as this episode is in the setting of large cathartic BM with vagal mediated bradycardia  compounded by PR Calcium Channel blocker.  If HR continues to run low, will need to consider d/cing PR Diltiazem.  Will follow along.  Leonie Man, M.D., M.S. [facility] [address] [place], [place]  [address]  [phone]  08/20/2011 5:10 PM

## 2011-08-21 DIAGNOSIS — I82409 Acute embolism and thrombosis of unspecified deep veins of unspecified lower extremity: Secondary | ICD-10-CM

## 2011-08-21 LAB — GLUCOSE, CAPILLARY: Glucose-Capillary: 203 mg/dL — ABNORMAL HIGH (ref 70–99)

## 2011-08-21 LAB — CBC
HCT: 24.3 % — ABNORMAL LOW (ref 36.0–46.0)
Hemoglobin: 7.9 g/dL — ABNORMAL LOW (ref 12.0–15.0)
MCH: 30.9 pg (ref 26.0–34.0)
MCHC: 32.5 g/dL (ref 30.0–36.0)
MCV: 94.9 fL (ref 78.0–100.0)
Platelets: 91 10*3/uL — ABNORMAL LOW (ref 150–400)
RBC: 2.56 MIL/uL — ABNORMAL LOW (ref 3.87–5.11)
RDW: 14.3 % (ref 11.5–15.5)
WBC: 6.5 10*3/uL (ref 4.0–10.5)

## 2011-08-21 LAB — ABO/RH: ABO/RH(D): A POS

## 2011-08-21 NOTE — Progress Notes (Signed)
  08/21/2011, 8:19 AM  Hospital day: 9 Antibiotics: none  Subjective: Transferred to CCU yesterday after possible vasovagal reaction with bowel movement. Awake, alert, fairly comfortable in bed now. Bowels moved only the one time, with some impacted stool evacuated; no other results despite miralax bid now. No nausea or vomiting this am, still some abdominal cramping. Having BRB per rectum every time voids or with flatus, with rectal fissure known.  No other  bleeding. Denies shortness of breath. Not OOB otherwise. Has tolerated SCDs in previous hospitalizations. Can tell improvement in LE swelling.  Objective: Vital signs in last 24 hours: Blood pressure 140/60, pulse 60, temperature 98.8 F (37.1 C), temperature source Oral, resp. rate 18, height 5\' 3"  (1.6 m), weight 295 lb 3.1 oz (133.9 kg), SpO2 97.00%.   Intake/Output from previous day: 05/15 0701 - 05/16 0700 In: 980 [P.O.:980] Out: 1250 [Urine:1250] Intake/Output this shift:    Physical exam: Looks comfortable on RA. PERRL, not icteric. Mouth moist and clear. Lungs without wheezes or rales. Heart rate 60/ regular. Abdomen obese, soft, minimal bowel sounds, not tender or distended. LE 2+ edema bilaterally which is improved, no clear cords. Moves all extremities easily.  Lab Results:  Basename 08/21/11 0538 08/20/11 0500  WBC 6.5 6.3  HGB 7.9* 8.7*  HCT 24.3* 26.0*  PLT 91* 96*   BMET  Basename 08/20/11 1158 08/20/11 0500  NA 134* 138  K 5.3* 4.8  CL 103 104  CO2 22 20  GLUCOSE 188* 161*  BUN 57* 55*  CREATININE 2.05* 2.03*  CALCIUM 8.8 8.9  CMET 08-19-11 Tprot 6.8 and alb 3.4 Calc GFR 26 Platelet antibodies still pending Erythropoietin level ordered Type and screen ordered since another blood draw for epo, however I will defer PRBC transfusion orders to cardiology. Studies/Results: No results found.  Discussed with nursing. Will try sitz baths and Dr Collene Mares may have additional suggestions for rectal fissure. Have  written SCDs since she is not on heparin prophylaxis (does continue ASA) Assessment/Plan: 1.Multifactorial anemia: chronic disease/ renal insufficiency/ rectal bleeding. Check erythropoietin level. Would consider PRBCs as above. 2.Mild thrombocytopenia dating back at least to 2011, stable today. Avoid heparin, HIT test pending. Consideration of further work up ok to defer to outpatient when present problems stable. 3.rectal fissure as above 4.DVT prophylaxis: SCDs and increase activity  5.renal insufficiency 6.cardiac disease 7.morbid obesity 8. History PEs with IVC filter in 9.diabetes on insulin.  Hanan Mcwilliams P [phone]

## 2011-08-21 NOTE — Progress Notes (Signed)
[facility]  DAILY PROGRESS NOTE   Subjective:  Episode of bradycardia into the 20's yesterday with associated bowel movement. HR now in the 40's-60's off of cardizem. Platelets have levelled off in the 90K range and she remains anemic with a low reticulocyte index.  Objective:  Temp:  [98.4 F (36.9 C)-98.7 F (37.1 C)] 98.4 F (36.9 C) (05/16 0000) Pulse Rate:  [47-101] 58  (05/16 0700) Resp:  [13-23] 18  (05/16 0700) BP: (120-147)/(41-105) 128/47 mmHg (05/16 0700) SpO2:  [92 %-100 %] 96 % (05/16 0700) Weight:  [133.9 kg (295 lb 3.1 oz)] 133.9 kg (295 lb 3.1 oz) (05/16 0500) Weight change: -1 kg (-2 lb 3.3 oz)  Intake/Output from previous day: 05/15 0701 - 05/16 0700 In: 980 [P.O.:980] Out: 1250 [Urine:1250]  Intake/Output from this shift:    Medications: Current Facility-Administered Medications  Medication Dose Route Frequency Provider Last Rate Last Dose  . 0.9 %  sodium chloride infusion   Intravenous Continuous Doreene Burke [place], [place] 75 mL/hr at 08/16/11 0754 75 mL/hr at 08/16/11 0754  . acetaminophen (TYLENOL) tablet 650 mg  650 mg Oral Q4H PRN Tarri Fuller, PA      . aspirin EC tablet 81 mg  81 mg Oral Daily Tarri Fuller, PA   81 mg at 08/20/11 1001  . atropine injection 0.4 mg  0.4 mg Intravenous Once Erlene Quan, PA   0.4 mg at 08/20/11 1136  . bisacodyl (DULCOLAX) EC tablet 5 mg  5 mg Oral Daily PRN Erlene Quan, PA   5 mg at 08/19/11 1810  . bisacodyl (DULCOLAX) suppository 10 mg  10 mg Rectal Daily PRN Sanda Klein, MD   10 mg at 08/20/11 0820  . citalopram (CELEXA) tablet 20 mg  20 mg Oral QHS Tarri Fuller, PA   20 mg at 08/20/11 2201  . diazepam (VALIUM) tablet 5 mg  5 mg Oral QHS PRN Tarri Fuller, PA   5 mg at 08/17/11 2200  . docusate sodium (COLACE) capsule 200 mg  200 mg Oral Daily Juanita Craver, MD   200 mg at 08/20/11 1001  . furosemide (LASIX) tablet 40 mg  40 mg Oral Daily Erlene Quan, PA   40 mg at 08/20/11 1001  .  HYDROcodone-acetaminophen (NORCO) 5-325 MG per tablet 1 tablet  1 tablet Oral Q6H PRN Tarri Fuller, PA   1 tablet at 08/17/11 2200  . insulin aspart (novoLOG) injection 0-9 Units  0-9 Units Subcutaneous TID WC Doreene Burke [place], PA   3 Units at 08/20/11 1203  . insulin glargine (LANTUS) injection 9 Units  9 Units Subcutaneous QHS Doreene Burke [place], [place]   9 Units at 08/20/11 2156  . levothyroxine (SYNTHROID, LEVOTHROID) tablet 50 mcg  50 mcg Oral QAC breakfast Doreene Burke [place], [place]   50 mcg at 08/20/11 0818  . omega-3 acid ethyl esters (LOVAZA) capsule 1 g  1 g Oral BID Tarri Fuller, PA   1 g at 08/20/11 2202  . ondansetron (ZOFRAN) injection 4 mg  4 mg Intravenous Q6H PRN Tarri Fuller, PA   4 mg at 08/21/11 0018  . pantoprazole (PROTONIX) EC tablet 40 mg  40 mg Oral Q1200 Doreene Burke [place], [place]   40 mg at 08/20/11 1203  . polyethylene glycol (MIRALAX / GLYCOLAX) packet 17 g  17 g Oral BID Juanita Craver, MD   17 g at 08/20/11 2202  . DISCONTD: diltiazem 2 % gel   Topical TID Juanita Craver,  MD      . DISCONTD: metoprolol tartrate (LOPRESSOR) tablet 25 mg  25 mg Oral BID Doreene Burke [place], PA   25 mg at 08/20/11 1001    Physical Exam: General appearance: alert, no distress and morbidly obese Neck: no adenopathy, no carotid bruit, no JVD, supple, symmetrical, trachea midline and thyroid not enlarged, symmetric, no tenderness/mass/nodules Lungs: clear to auscultation bilaterally Heart: regular rate and rhythm, S1, S2 normal, no murmur, click, rub or gallop Abdomen: soft, non-tender; bowel sounds normal; no masses,  no organomegaly Extremities: extremities normal, atraumatic, no cyanosis or edema Pulses: 2+ and symmetric  Lab Results: Results for orders placed during the hospital encounter of 08/13/11 (from the past 48 hour(s))  GLUCOSE, CAPILLARY     Status: Abnormal   Collection Time   08/19/11 11:20 AM      Component Value Range Comment   Glucose-Capillary 131 (*) 70 - 99 (mg/dL)    Comment 1 Documented in Chart       Comment 2 Notify RN     HEMOGLOBIN AND HEMATOCRIT, BLOOD     Status: Abnormal   Collection Time   08/19/11  1:49 PM      Component Value Range Comment   Hemoglobin 8.9 (*) 12.0 - 15.0 (g/dL)    HCT 25.9 (*) 36.0 - 46.0 (%)   GLUCOSE, CAPILLARY     Status: Abnormal   Collection Time   08/19/11  4:29 PM      Component Value Range Comment   Glucose-Capillary 134 (*) 70 - 99 (mg/dL)    Comment 1 Documented in Chart      Comment 2 Notify RN     CBC     Status: Abnormal   Collection Time   08/19/11  5:43 PM      Component Value Range Comment   WBC 5.9  4.0 - 10.5 (K/uL)    RBC 2.76 (*) 3.87 - 5.11 (MIL/uL)    Hemoglobin 8.8 (*) 12.0 - 15.0 (g/dL)    HCT 26.6 (*) 36.0 - 46.0 (%)    MCV 96.4  78.0 - 100.0 (fL)    MCH 31.9  26.0 - 34.0 (pg)    MCHC 33.1  30.0 - 36.0 (g/dL)    RDW 14.4  11.5 - 15.5 (%)    Platelets 81 (*) 150 - 400 (K/uL) CONSISTENT WITH PREVIOUS RESULT  DIFFERENTIAL     Status: Normal   Collection Time   08/19/11  5:43 PM      Component Value Range Comment   Neutrophils Relative 69  43 - 77 (%)    Neutro Abs 4.1  1.7 - 7.7 (K/uL)    Lymphocytes Relative 17  12 - 46 (%)    Lymphs Abs 1.0  0.7 - 4.0 (K/uL)    Monocytes Relative 9  3 - 12 (%)    Monocytes Absolute 0.5  0.1 - 1.0 (K/uL)    Eosinophils Relative 5  0 - 5 (%)    Eosinophils Absolute 0.3  0.0 - 0.7 (K/uL)    Basophils Relative 1  0 - 1 (%)    Basophils Absolute 0.0  0.0 - 0.1 (K/uL)   HAPTOGLOBIN     Status: Normal   Collection Time   08/19/11  5:43 PM      Component Value Range Comment   Haptoglobin (NOTE)  16 - 200 (mg/dL)   COMPREHENSIVE METABOLIC PANEL     Status: Abnormal   Collection Time   08/19/11  5:43 PM  Component Value Range Comment   Sodium 136  135 - 145 (mEq/L)    Potassium 4.6  3.5 - 5.1 (mEq/L)    Chloride 104  96 - 112 (mEq/L)    CO2 21  19 - 32 (mEq/L)    Glucose, Bld 188 (*) 70 - 99 (mg/dL)    BUN 54 (*) 6 - 23 (mg/dL)    Creatinine, Ser 2.05 (*) 0.50 - 1.10 (mg/dL)     Calcium 8.9  8.4 - 10.5 (mg/dL)    Total Protein 6.8  6.0 - 8.3 (g/dL)    Albumin 3.4 (*) 3.5 - 5.2 (g/dL)    AST 22  0 - 37 (U/L)    ALT 26  0 - 35 (U/L)    Alkaline Phosphatase 59  39 - 117 (U/L)    Total Bilirubin 0.4  0.3 - 1.2 (mg/dL)    GFR calc non Af Amer 26 (*) >90 (mL/min)    GFR calc Af Amer 30 (*) >90 (mL/min)   GLUCOSE, CAPILLARY     Status: Abnormal   Collection Time   08/19/11  9:34 PM      Component Value Range Comment   Glucose-Capillary 170 (*) 70 - 99 (mg/dL)   CBC     Status: Abnormal   Collection Time   08/20/11  5:00 AM      Component Value Range Comment   WBC 6.3  4.0 - 10.5 (K/uL)    RBC 2.74 (*) 3.87 - 5.11 (MIL/uL)    Hemoglobin 8.7 (*) 12.0 - 15.0 (g/dL)    HCT 26.0 (*) 36.0 - 46.0 (%)    MCV 94.9  78.0 - 100.0 (fL)    MCH 31.8  26.0 - 34.0 (pg)    MCHC 33.5  30.0 - 36.0 (g/dL)    RDW 14.4  11.5 - 15.5 (%)    Platelets 96 (*) 150 - 400 (K/uL) CONSISTENT WITH PREVIOUS RESULT  BASIC METABOLIC PANEL     Status: Abnormal   Collection Time   08/20/11  5:00 AM      Component Value Range Comment   Sodium 138  135 - 145 (mEq/L)    Potassium 4.8  3.5 - 5.1 (mEq/L)    Chloride 104  96 - 112 (mEq/L)    CO2 20  19 - 32 (mEq/L)    Glucose, Bld 161 (*) 70 - 99 (mg/dL)    BUN 55 (*) 6 - 23 (mg/dL)    Creatinine, Ser 2.03 (*) 0.50 - 1.10 (mg/dL)    Calcium 8.9  8.4 - 10.5 (mg/dL)    GFR calc non Af Amer 26 (*) >90 (mL/min)    GFR calc Af Amer 31 (*) >90 (mL/min)   GLUCOSE, CAPILLARY     Status: Abnormal   Collection Time   08/20/11  6:19 AM      Component Value Range Comment   Glucose-Capillary 177 (*) 70 - 99 (mg/dL)   GLUCOSE, CAPILLARY     Status: Abnormal   Collection Time   08/20/11 11:07 AM      Component Value Range Comment   Glucose-Capillary 202 (*) 70 - 99 (mg/dL)    Comment 1 Documented in Chart      Comment 2 Notify RN     BASIC METABOLIC PANEL     Status: Abnormal   Collection Time   08/20/11 11:58 AM      Component Value Range Comment    Sodium 134 (*) 135 - 145 (mEq/L)  Potassium 5.3 (*) 3.5 - 5.1 (mEq/L)    Chloride 103  96 - 112 (mEq/L)    CO2 22  19 - 32 (mEq/L)    Glucose, Bld 188 (*) 70 - 99 (mg/dL)    BUN 57 (*) 6 - 23 (mg/dL)    Creatinine, Ser 2.05 (*) 0.50 - 1.10 (mg/dL)    Calcium 8.8  8.4 - 10.5 (mg/dL)    GFR calc non Af Amer 26 (*) >90 (mL/min)    GFR calc Af Amer 30 (*) >90 (mL/min)   GLUCOSE, CAPILLARY     Status: Abnormal   Collection Time   08/20/11  3:50 PM      Component Value Range Comment   Glucose-Capillary 140 (*) 70 - 99 (mg/dL)   GLUCOSE, CAPILLARY     Status: Abnormal   Collection Time   08/20/11  9:54 PM      Component Value Range Comment   Glucose-Capillary 169 (*) 70 - 99 (mg/dL)   CBC     Status: Abnormal   Collection Time   08/21/11  5:38 AM      Component Value Range Comment   WBC 6.5  4.0 - 10.5 (K/uL)    RBC 2.56 (*) 3.87 - 5.11 (MIL/uL)    Hemoglobin 7.9 (*) 12.0 - 15.0 (g/dL)    HCT 24.3 (*) 36.0 - 46.0 (%)    MCV 94.9  78.0 - 100.0 (fL)    MCH 30.9  26.0 - 34.0 (pg)    MCHC 32.5  30.0 - 36.0 (g/dL)    RDW 14.3  11.5 - 15.5 (%)    Platelets 91 (*) 150 - 400 (K/uL) CONSISTENT WITH PREVIOUS RESULT    Imaging: No results found.  Assessment:  1. Active Problems: 2.  Chest pain 3.  Shortness of breath 4.  Insulin dependent diabetes mellitus 5.  Morbid obesity, (no history of sleep apnea evaluation) 6.  HTN (hypertension) 7.  HLD (hyperlipidemia) 8.  Anemia 9.  History of pulmonary embolism, recurrent, 3 seperate episodes. 10.  Presence of IVC filter, placed June 2011 after 3d PE 11.  Pulmonary HTN, Moderate  PA pressure 68mmHG 09/2009 12.  Sinus bradycardia 13.  Positive D dimer- 11.17, VQ low prob, LE venous doppler neg 14.  Diastolic dysfunction, grade 2, EF 60% 2D June 2011 15.  LBBB (left bundle branch block) 16.  Anticoagulants causing adverse effect in therapeutic use, history of retinal bleeding on Coumadin 17.  Renal insufficiency 18.  Rectal bleeding, GI  w/u- rectal fissure 19.  Thrombocytopenia, suspected HIT 20.   Plan:  Probably vagally mediated bradycardia, however, she has underlying conduction system disease with a LBBB. Agree with discontinuing diltiazem. Would consider outpatient monitor to evaluate for additional pacemaker indications. Ok to transfer back to telemetry today. Still having scant rectal bleeding. Add soluble fiber. Await additional recommendations from hematology regarding anemia and thrombocytopenia.  Time Spent Directly with Patient:  15 minutes  Length of Stay:  LOS: 8 days   Pixie Casino, MD, [facility] Attending Cardiologist [facility] 08/21/2011, 7:05 AM

## 2011-08-22 DIAGNOSIS — J3489 Other specified disorders of nose and nasal sinuses: Secondary | ICD-10-CM

## 2011-08-22 DIAGNOSIS — R509 Fever, unspecified: Secondary | ICD-10-CM

## 2011-08-22 DIAGNOSIS — R001 Bradycardia, unspecified: Secondary | ICD-10-CM | POA: Diagnosis not present

## 2011-08-22 LAB — CBC
HCT: 25.9 % — ABNORMAL LOW (ref 36.0–46.0)
Hemoglobin: 8.5 g/dL — ABNORMAL LOW (ref 12.0–15.0)
MCH: 31.3 pg (ref 26.0–34.0)
MCHC: 32.8 g/dL (ref 30.0–36.0)
MCV: 95.2 fL (ref 78.0–100.0)
Platelets: 108 10*3/uL — ABNORMAL LOW (ref 150–400)
RBC: 2.72 MIL/uL — ABNORMAL LOW (ref 3.87–5.11)
RDW: 14.2 % (ref 11.5–15.5)
WBC: 5.8 10*3/uL (ref 4.0–10.5)

## 2011-08-22 LAB — GLUCOSE, CAPILLARY: Glucose-Capillary: 200 mg/dL — ABNORMAL HIGH (ref 70–99)

## 2011-08-22 MED ORDER — SALINE SPRAY 0.65 % NA SOLN
1.0000 | NASAL | Status: DC | PRN
  Filled 2011-08-22: qty 44

## 2011-08-22 MED ORDER — FLUTICASONE PROPIONATE 50 MCG/ACT NA SUSP
1.0000 | Freq: Four times a day (QID) | NASAL | Status: DC | PRN
  Filled 2011-08-22: qty 16

## 2011-08-22 MED ORDER — LORATADINE 10 MG PO TABS
10.0000 mg | ORAL_TABLET | Freq: Every day | ORAL | Status: DC | PRN
  Filled 2011-08-22: qty 1

## 2011-08-22 NOTE — Progress Notes (Signed)
Episodes of lightheadeness while up in the commode w/ hr in the 120s-pos blood in the BM noted.Notified [employer].

## 2011-08-22 NOTE — Progress Notes (Signed)
Subjective:  No complaints  Objective:  Vital Signs in the last 24 hours: Temp:  [97.7 F (36.5 C)-99.3 F (37.4 C)] 98.9 F (37.2 C) (05/17 0752) Pulse Rate:  [62-71] 62  (05/17 0400) Resp:  [14-22] 14  (05/17 0730) BP: (100-157)/(40-75) 128/43 mmHg (05/17 0730) SpO2:  [96 %-98 %] 96 % (05/17 0752) Weight:  [135.2 kg (298 lb 1 oz)] 135.2 kg (298 lb 1 oz) (05/17 0500)  Intake/Output from previous day:  Intake/Output Summary (Last 24 hours) at 08/22/11 0823 Last data filed at 08/22/11 0600  Gross per 24 hour  Intake    350 ml  Output    450 ml  Net   -100 ml    Physical Exam: General appearance: alert, cooperative and no distress Lungs: clear to auscultation bilaterally Heart: regular rate and rhythm   Rate: 75  Rhythm: normal sinus rhythm and 1st degree AVB, LBBB  Lab Results:  [facility] 08/22/11 0540 08/21/11 0538  WBC 5.8 6.5  HGB 8.5* 7.9*  PLT 108* 91*    Basename 08/20/11 1158 08/20/11 0500  NA 134* 138  K 5.3* 4.8  CL 103 104  CO2 22 20  GLUCOSE 188* 161*  BUN 57* 55*  CREATININE 2.05* 2.03*   No results found for this basename: TROPONINI:2,CK,MB:2 in the last 72 hours Hepatic Function Panel  Basename 08/19/11 1743  PROT 6.8  ALBUMIN 3.4*  AST 22  ALT 26  ALKPHOS 59  BILITOT 0.4  BILIDIR --  IBILI --   No results found for this basename: CHOL in the last 72 hours No results found for this basename: INR in the last 72 hours  Imaging: Imaging results have been reviewed  Cardiac Studies:  Assessment/Plan:   Principal Problem:  *Chest pain, admitting complaint, MI r/o  Active Problems:  Bradycardia, transient, probably vagal, but she has SSS component  Insulin dependent diabetes mellitus  History of pulmonary embolism, recurrent, 3 seperate episodes.  Positive D dimer- 11.17, VQ low prob, LE venous doppler neg  Diastolic dysfunction, grade 2, EF 60% 2D June 2011  Anticoagulants causing adverse effect in therapeutic use, history of  retinal bleeding on Coumadin  Renal insufficiency, Glucophage, ARB stopped  Thrombocytopenia, suspected HIT  Morbid obesity, (no history of sleep apnea evaluation)  HTN (hypertension)  HLD (hyperlipidemia)  Anemia  Presence of IVC filter, placed June 2011 after 3d PE  Pulmonary HTN, Moderate  PA pressure 74mmHG 09/2009  Sinus bradycardia  LBBB (left bundle branch block)  Rectal bleeding, GI w/u- rectal fissure   Plan- Tx to telemetry, watch over the weekend. Off beta blocker and Diltiazem cream, she did have a run of tachycardia that looks like PAF.    Kerin Ransom PA-C 08/22/2011, 8:23 AM    Agree with note written by Kerin Ransom [facility]  Admitted with CP/Jaw pain/diaporesis and SOB. D-Dimer 11 but V/Q neg. Enz neg. Myoview 2 day protocol negative. H/O PE s/p IVC filter. GIB with decreased HGB . Exam benign except for soft Ao outflow murmur (2D nl LV fxn, Ao Sclerosis). OK to transfer to tele. Observe over weekend, prob home early next week. Will need OP event monitor at D/C because of episode of symptomatic bradycardia. Scr 2.0 (baseline mid 1 range). If she continues to have jaw and CP will probably need cath despite neg nuc.  Lorretta Harp 08/22/2011 8:49 AM

## 2011-08-22 NOTE — Progress Notes (Signed)
Pt reports that she just had a large bowel movement but still refusing the sitz baths at this time.   [name] Patient

## 2011-08-22 NOTE — Progress Notes (Signed)
  08/22/2011, 5:48 PM  Hospital day: 10 Antibiotics: none  Subjective: Patient seen, now on unit 2000. Feeling better other than sinus congestion and drainage today, with temperature 101 earlier this afternoon; has frequent sinus/ environmental allergy problems for which she uses OTC sinus meds. Bowels have moved several times, small amount of rectal bleeding with this earlier today. No other bleeding. Less LE swelling. No lower respiratory symptoms and no other localizing symptoms of infection.  Objective: Vital signs in last 24 hours: Blood pressure 125/67, pulse 84, temperature 101 F (38.3 C), temperature source Oral, resp. rate 18, height 5\' 3"  (1.6 m), weight 298 lb 1 oz (135.2 kg), SpO2 98.00%.   Intake/Output from previous day: 05/16 0701 - 05/17 0700 In: 575 [P.O.:425] Out: 450 [Urine:450] Intake/Output this shift: Total I/O In: 420 [P.O.:420] Out: -   Physical exam: Up in recliner,on RA, looks fairly comfortable but sounds nasally congested. Oral mucosa moist and clear. Lungs without wheezes or crackles. Abdomen soft, quiet, not tender. Still 1-2+ pedal edema but improved. No bruises or petechiae.   Lab Results:  Basename 08/22/11 0540 08/21/11 0538  WBC 5.8 6.5  HGB 8.5* 7.9*  HCT 25.9* 24.3*  PLT 108* 91*  Type and screen drawn but not transfused. BMET  Basename 08/20/11 1158 08/20/11 0500  NA 134* 138  K 5.3* 4.8  CL 103 104  CO2 22 20  GLUCOSE 188* 161*  BUN 57* 55*  CREATININE 2.05* 2.03*  CALCIUM 8.8 8.9   epo pending, plt antibodies still not resulted. Studies/Results: No results found.   Assessment/Plan: 1.Anemia which appears secondary to chronic disease/ renal insufficiency/ bleeding this admission from rectal fissure. Would transfuse PRBCs if more symptomatic, but Hgb is improving. 2.thrombocytopenia x at least 3 years, present platelet count ~ back to baseline and no other bleeding 3.rectal fissure: continue sitz baths until resolved. 4. Hx  PEs, IVC filter in 5. Sinus congestion and fever earlier today: have written flonase, saline nasal spray and prn claritin. 5.cardiac disease, morbid obesity, renal insufficiency, diabetes on insulin.  She follows closely with primary MD Mellody Drown. I am glad to see her back at my office if Dr.Moreira needs me to do this after discharge, but I have not set up visit back to me yet.  Please call if my partners can help over weekend. Trula Frede P [phone]

## 2011-08-23 LAB — CBC
HCT: 23.9 % — ABNORMAL LOW (ref 36.0–46.0)
Hemoglobin: 7.9 g/dL — ABNORMAL LOW (ref 12.0–15.0)
MCH: 31.3 pg (ref 26.0–34.0)
MCHC: 33.1 g/dL (ref 30.0–36.0)
MCV: 94.8 fL (ref 78.0–100.0)
Platelets: 109 10*3/uL — ABNORMAL LOW (ref 150–400)
RBC: 2.52 MIL/uL — ABNORMAL LOW (ref 3.87–5.11)
RDW: 14 % (ref 11.5–15.5)
WBC: 6.1 10*3/uL (ref 4.0–10.5)

## 2011-08-23 LAB — BASIC METABOLIC PANEL WITH GFR
BUN: 50 mg/dL — ABNORMAL HIGH (ref 6–23)
CO2: 22 meq/L (ref 19–32)
Calcium: 8.8 mg/dL (ref 8.4–10.5)
Chloride: 100 meq/L (ref 96–112)
Creatinine, Ser: 2.03 mg/dL — ABNORMAL HIGH (ref 0.50–1.10)
GFR calc Af Amer: 31 mL/min — ABNORMAL LOW
GFR calc non Af Amer: 26 mL/min — ABNORMAL LOW
Glucose, Bld: 190 mg/dL — ABNORMAL HIGH (ref 70–99)
Potassium: 4.9 meq/L (ref 3.5–5.1)
Sodium: 133 meq/L — ABNORMAL LOW (ref 135–145)

## 2011-08-23 LAB — GLUCOSE, CAPILLARY
Glucose-Capillary: 172 mg/dL — ABNORMAL HIGH (ref 70–99)
Glucose-Capillary: 209 mg/dL — ABNORMAL HIGH (ref 70–99)
Glucose-Capillary: 226 mg/dL — ABNORMAL HIGH (ref 70–99)
Glucose-Capillary: 192 mg/dL — ABNORMAL HIGH (ref 70–99)

## 2011-08-24 LAB — URINALYSIS, ROUTINE W REFLEX MICROSCOPIC
Bilirubin Urine: NEGATIVE
Glucose, UA: NEGATIVE mg/dL
Ketones, ur: NEGATIVE mg/dL
Nitrite: NEGATIVE
Protein, ur: 30 mg/dL — AB
Specific Gravity, Urine: 1.011 (ref 1.005–1.030)
Urobilinogen, UA: 0.2 mg/dL (ref 0.0–1.0)
pH: 5 (ref 5.0–8.0)

## 2011-08-24 LAB — URINE MICROSCOPIC-ADD ON

## 2011-08-24 LAB — GLUCOSE, CAPILLARY
Glucose-Capillary: 224 mg/dL — ABNORMAL HIGH (ref 70–99)
Glucose-Capillary: 214 mg/dL — ABNORMAL HIGH (ref 70–99)

## 2011-08-24 LAB — PRO B NATRIURETIC PEPTIDE: Pro B Natriuretic peptide (BNP): 736.7 pg/mL — ABNORMAL HIGH (ref 0–125)

## 2011-08-24 LAB — MICROALBUMIN / CREATININE URINE RATIO: Microalb, Ur: 17.4 mg/dL — ABNORMAL HIGH (ref 0.00–1.89)

## 2011-08-24 MED ORDER — FUROSEMIDE 10 MG/ML IJ SOLN
80.0000 mg | Freq: Two times a day (BID) | INTRAMUSCULAR | Status: DC
  Administered 2011-08-24 – 2011-08-25 (×3): 80 mg via INTRAVENOUS
  Filled 2011-08-24 (×5): qty 8

## 2011-08-24 MED ORDER — HYDROCODONE-ACETAMINOPHEN 5-325 MG PO TABS
1.0000 | ORAL_TABLET | Freq: Four times a day (QID) | ORAL | Status: DC | PRN
  Administered 2011-08-24 – 2011-09-04 (×12): 2 via ORAL
  Filled 2011-08-24 (×13): qty 2

## 2011-08-24 NOTE — Progress Notes (Signed)
Patient c/o increase swelling and pain with right leg and foot.. Leg and foot warm and is edematous slightly pink enc feet to be elevated and pain meds.

## 2011-08-24 NOTE — Progress Notes (Signed)
Subjective:  C/O pain and swelling both legs, no increased SOB  Objective:  Vital Signs in the last 24 hours: Temp:  [98.2 F (36.8 C)-98.8 F (37.1 C)] 98.2 F (36.8 C) (05/19 0443) Pulse Rate:  [75-80] 75  (05/19 0443) Resp:  [18-20] 19  (05/19 0443) BP: (125-140)/(45-73) 125/45 mmHg (05/19 0443) SpO2:  [94 %-99 %] 97 % (05/19 0443)  Intake/Output from previous day:  Intake/Output Summary (Last 24 hours) at 08/24/11 0909 Last data filed at 08/24/11 0819  Gross per 24 hour  Intake    600 ml  Output      2 ml  Net    598 ml    Physical Exam: General appearance: alert, cooperative, mild distress and morbidly obese Lungs: clear to auscultation bilaterally Heart: regular rate and rhythm Both legs 2+ edema with chronic skin changes, boths legs tender to touch, no sings of infection   Rate: 78  Rhythm: normal sinus rhythm and 1st degree AVB  Lab Results:  Basename 08/23/11 0612 08/22/11 0540  WBC 6.1 5.8  HGB 7.9* 8.5*  PLT 109* 108*    Basename 08/23/11 0612  NA 133*  K 4.9  CL 100  CO2 22  GLUCOSE 190*  BUN 50*  CREATININE 2.03*   No results found for this basename: TROPONINI:2,CK,MB:2 in the last 72 hours Hepatic Function Panel No results found for this basename: PROT,ALBUMIN,AST,ALT,ALKPHOS,BILITOT,BILIDIR,IBILI in the last 72 hours No results found for this basename: CHOL in the last 72 hours No results found for this basename: INR in the last 72 hours  Imaging: Imaging results have been reviewed  Cardiac Studies:  Assessment/Plan:   Principal Problem:  *Chest pain, admitting complaint, MI r/o, Myoview low risk Active Problems:  Bradycardia, transient, probably vagal, but she has SSS component  Insulin dependent diabetes mellitus  History of pulmonary embolism, recurrent, 3 seperate episodes.  Positive D dimer- 11.17, VQ low prob, LE venous doppler neg  Diastolic dysfunction, grade 2, EF 65-70% May 2013  Anticoagulants causing adverse effect in  therapeutic use, history of retinal bleeding on Coumadin  Renal insufficiency, Glucophage, ARB stopped  Thrombocytopenia, suspected HIT  Morbid obesity, (no history of sleep apnea evaluation)  HTN (hypertension)  HLD (hyperlipidemia)  Anemia  Presence of IVC filter, placed June 2011 after 3d PE  Pulmonary HTN, Moderate  PA pressure 66mmHG 09/2009  Sinus bradycardia  LBBB (left bundle branch block)  Rectal bleeding, GI w/u- rectal fissure   Plan- Lower extremity edema is probably multifactorial, will try and push diuretics, change lasix 40mg  po daily to 80mg  IV bid for a few days. Her BNP on 5/13 was 530. She has not been getting regular daily wgts but her trend is up from admission.   Kerin Ransom PA-C 08/24/2011, 9:09 AM  I have seen and examined the patient along with Kerin Ransom PA-C.  I have reviewed the chart, notes and new data.  I agree with PA's note.  Key new complaints: edema is now dominant complaint  PLAN: We can try to help edema w diuretics cautiously, but need to closely monitor renal function. A large part of edema may be postphlebitic syndrome and venous insufficiency, although right heart failure plays a role. Check urine for proteinuria.  Sanda Klein, MD, [place] [phone] 08/24/2011, 9:45 AM

## 2011-08-24 NOTE — Progress Notes (Addendum)
08/24/2011 11:47 AM Nursing note Patient stating inability to void. Bladder scan showed 488cc. Kerin Ransom Pa-c. Paged and made aware. Orders received to place foley and send u/a with c/s. Orders enacted. Will continue to monitor.  Uzair Godley, Arville Lime  6:51 PM Late order entry in [facility] for foley insertion. Foley catheter inserted earlier in shift at 1100.  See Doc Flowsheets. Kayona Foor, Arville Lime

## 2011-08-25 DIAGNOSIS — N390 Urinary tract infection, site not specified: Secondary | ICD-10-CM | POA: Diagnosis not present

## 2011-08-25 LAB — CBC
HCT: 23.6 % — ABNORMAL LOW (ref 36.0–46.0)
Hemoglobin: 8 g/dL — ABNORMAL LOW (ref 12.0–15.0)
MCH: 31.6 pg (ref 26.0–34.0)
MCHC: 33.9 g/dL (ref 30.0–36.0)
MCV: 93.3 fL (ref 78.0–100.0)
Platelets: 123 10*3/uL — ABNORMAL LOW (ref 150–400)
RBC: 2.53 MIL/uL — ABNORMAL LOW (ref 3.87–5.11)
RDW: 13.9 % (ref 11.5–15.5)
WBC: 8.4 10*3/uL (ref 4.0–10.5)

## 2011-08-25 LAB — BASIC METABOLIC PANEL
BUN: 60 mg/dL — ABNORMAL HIGH (ref 6–23)
CO2: 22 mEq/L (ref 19–32)
Calcium: 9.1 mg/dL (ref 8.4–10.5)
Chloride: 95 mEq/L — ABNORMAL LOW (ref 96–112)
Creatinine, Ser: 2.27 mg/dL — ABNORMAL HIGH (ref 0.50–1.10)
GFR calc Af Amer: 27 mL/min — ABNORMAL LOW (ref >90)
GFR calc non Af Amer: 23 mL/min — ABNORMAL LOW (ref >90)
Glucose, Bld: 205 mg/dL — ABNORMAL HIGH (ref 70–99)
Potassium: 5 mEq/L (ref 3.5–5.1)
Sodium: 131 mEq/L — ABNORMAL LOW (ref 135–145)

## 2011-08-25 LAB — GLUCOSE, CAPILLARY
Glucose-Capillary: 192 mg/dL — ABNORMAL HIGH (ref 70–99)
Glucose-Capillary: 220 mg/dL — ABNORMAL HIGH (ref 70–99)
Glucose-Capillary: 168 mg/dL — ABNORMAL HIGH (ref 70–99)

## 2011-08-25 LAB — CREATININE CLEARANCE, URINE, 24 HOUR
Creatinine Clearance: 62 mL/min — ABNORMAL LOW (ref 75–115)
Creatinine, Urine: 118.69 mg/dL
Creatinine: 2.27 mg/dL — ABNORMAL HIGH (ref 0.50–1.10)

## 2011-08-25 LAB — PROTEIN, URINE, 24 HOUR
Protein, 24H Urine: 425 mg/d — ABNORMAL HIGH (ref 50–100)
Protein, Urine: 25 mg/dL
Urine Total Volume-UPROT: 1700 mL

## 2011-08-25 MED ORDER — FUROSEMIDE 10 MG/ML IJ SOLN
80.0000 mg | Freq: Every morning | INTRAMUSCULAR | Status: DC
  Filled 2011-08-25: qty 8

## 2011-08-25 MED ORDER — GLUCERNA SHAKE PO LIQD
237.0000 mL | Freq: Two times a day (BID) | ORAL | Status: DC
  Administered 2011-08-26 – 2011-09-01 (×3): 237 mL via ORAL
  Filled 2011-08-25: qty 237

## 2011-08-25 MED ORDER — CIPROFLOXACIN HCL 500 MG PO TABS
500.0000 mg | ORAL_TABLET | Freq: Two times a day (BID) | ORAL | Status: AC
  Administered 2011-08-25 – 2011-08-31 (×14): 500 mg via ORAL
  Filled 2011-08-25 (×14): qty 1

## 2011-08-25 MED ORDER — PRO-STAT SUGAR FREE PO LIQD
30.0000 mL | Freq: Two times a day (BID) | ORAL | Status: DC
  Administered 2011-08-26 – 2011-08-28 (×3): 30 mL via ORAL
  Filled 2011-08-25 (×22): qty 30

## 2011-08-25 NOTE — Progress Notes (Signed)
INITIAL ADULT NUTRITION ASSESSMENT Date: 08/25/2011   Time: 12:29 PM  Reason for Assessment: Consult  ASSESSMENT: Female 56 y.o.  Dx: Chest pain  Hx:  Past Medical History  Diagnosis Date  . CHF (congestive heart failure)   . Diabetes mellitus   . Arthritis   . Hypertension   . Depression   . DVT (deep venous thrombosis) 10 years ago  . PE (pulmonary thromboembolism) 3 years ag0    Related Meds:  Past Medical History  Diagnosis Date  . CHF (congestive heart failure)   . Diabetes mellitus   . Arthritis   . Hypertension   . Depression   . DVT (deep venous thrombosis) 10 years ago  . PE (pulmonary thromboembolism) 3 years ag0    Ht: 5\' 3"  (160 cm)  Wt: 298 lb 1 oz (135.2 kg)  Ideal Wt: 52.2 kg  % Ideal Wt: 258%  Usual Wt: --- % Usual Wt: ---  Body mass index is 52.80 kg/(m^2).  Food/Nutrition Related Hx: no triggers identified per admission nutrition screen  Labs:  CMP     Component Value Date/Time   NA 131* 08/25/2011 0630   K 5.0 08/25/2011 0630   CL 95* 08/25/2011 0630   CO2 22 08/25/2011 0630   GLUCOSE 205* 08/25/2011 0630   BUN 60* 08/25/2011 0630   CREATININE 2.27* 08/25/2011 0630   CALCIUM 9.1 08/25/2011 0630   PROT 6.8 08/19/2011 1743   ALBUMIN 3.4* 08/19/2011 1743   AST 22 08/19/2011 1743   ALT 26 08/19/2011 1743   ALKPHOS 59 08/19/2011 1743   BILITOT 0.4 08/19/2011 1743   GFRNONAA 23* 08/25/2011 0630   GFRAA 27* 08/25/2011 0630     Intake/Output Summary (Last 24 hours) at 08/25/11 1229 Last data filed at 08/25/11 0844  Gross per 24 hour  Intake    480 ml  Output   1650 ml  Net  -1170 ml    CBG (last 3)   Basename 08/25/11 1121 08/25/11 0633 08/24/11 2132  GLUCAP 220* 192* 205*    Diet Order: Carb Control  Supplements/Tube Feeding: N/A  IVF: N/A  Estimated Nutritional Needs:   Kcal: 1600-1800 Protein: 85-95 gm Fluid: 1.6-1.8 L  Patient admitted with SOB and chest pain; chest x-ray shows enlargement of cardiac silhouette with  minimal right base atelectasis; reports a decreased appetite currently and prior to hospitalization; states she was consuming 3 meals per day, however, in much smaller quantities; PO intake has been variable at 10-100% per flowsheet records; no unintentional weight loss reported; she is amenable to trying Glucerna Shake supplement -- RD to order.  NUTRITION DIAGNOSIS: -Inadequate oral intake (NI-2.1).  Status: Ongoing  RELATED TO: decreased appetite  AS EVIDENCE BY: PO intake 10%  MONITORING/EVALUATION(Goals): Goal: meet >90% of estimated nutrition needs Monitor: PO intake, weight, labs, I/O's  EDUCATION NEEDS: -No education needs identified at this time  INTERVENTION:  Glucerna Shake PO BID (220 kcals, 9.9 gm protein per 8 fl oz can)  Prostat liquid protein 30 ml PO BID (100 kcals, 15 gm protein per dose)  RD to follow for nutrition care plan  Dietitian #: [phone]  [place] Per approved criteria  -Morbid Obesity    Lady Deutscher 08/25/2011, 12:29 PM

## 2011-08-25 NOTE — Progress Notes (Addendum)
[facility]  Subjective: Her legs and feet hurt.  Objective: Vital signs in last 24 hours: Temp:  [98.6 F (37 C)-99.7 F (37.6 C)] 98.6 F (37 C) (05/20 0450) Pulse Rate:  [69-84] 84  (05/20 0450) Resp:  [18-20] 19  (05/20 0450) BP: (131-137)/(59-61) 131/61 mmHg (05/20 0450) SpO2:  [93 %-97 %] 96 % (05/20 0450) Last BM Date: 08/23/11 (loose pt states)  Intake/Output from previous day: 05/19 0701 - 05/20 0700 In: 360 [P.O.:360] Out: 1300 [Urine:1300] Intake/Output this shift: Total I/O In: 240 [P.O.:240] Out: 350 [Urine:350]  Medications Current Facility-Administered Medications  Medication Dose Route Frequency Provider Last Rate Last Dose  . acetaminophen (TYLENOL) tablet 650 mg  650 mg Oral Q4H PRN Tarri Fuller, PA   650 mg at 08/22/11 1817  . aspirin EC tablet 81 mg  81 mg Oral Daily Tarri Fuller, PA   81 mg at 08/24/11 0944  . bisacodyl (DULCOLAX) EC tablet 5 mg  5 mg Oral Daily PRN Erlene Quan, PA   5 mg at 08/19/11 1810  . bisacodyl (DULCOLAX) suppository 10 mg  10 mg Rectal Daily PRN Sanda Klein, MD   10 mg at 08/20/11 0820  . ciprofloxacin (CIPRO) tablet 500 mg  500 mg Oral BID Tarri Fuller, PA      . citalopram (CELEXA) tablet 20 mg  20 mg Oral QHS Tarri Fuller, PA   20 mg at 08/24/11 2302  . diazepam (VALIUM) tablet 5 mg  5 mg Oral QHS PRN Tarri Fuller, PA   5 mg at 08/21/11 2201  . docusate sodium (COLACE) capsule 200 mg  200 mg Oral Daily Juanita Craver, MD   200 mg at 08/22/11 0910  . fluticasone (FLONASE) 50 MCG/ACT nasal spray 1 spray  1 spray Each Nare QID PRN Lennis Marion Downer, MD      . furosemide (LASIX) injection 80 mg  80 mg Intravenous BID Tarri Fuller, PA   80 mg at 08/25/11 [id number]  . HYDROcodone-acetaminophen (NORCO) 5-325 MG per tablet 1-2 tablet  1-2 tablet Oral Q6H PRN Erlene Quan, PA   2 tablet at 08/24/11 2035  . insulin aspart (novoLOG) injection 0-9 Units  0-9 Units Subcutaneous TID WC Doreene Burke [place], PA   2 Units at  08/25/11 0827  . insulin glargine (LANTUS) injection 9 Units  9 Units Subcutaneous QHS Doreene Burke [place], [place]   9 Units at 08/24/11 2301  . levothyroxine (SYNTHROID, LEVOTHROID) tablet 50 mcg  50 mcg Oral QAC breakfast Doreene Burke [place], [place]   50 mcg at 08/25/11 0827  . loratadine (CLARITIN) tablet 10 mg  10 mg Oral Daily PRN Lennis Marion Downer, MD      . omega-3 acid ethyl esters (LOVAZA) capsule 1 g  1 g Oral BID Tarri Fuller, PA   1 g at 08/24/11 2301  . ondansetron (ZOFRAN) injection 4 mg  4 mg Intravenous Q6H PRN Tarri Fuller, PA   4 mg at 08/21/11 0018  . pantoprazole (PROTONIX) EC tablet 40 mg  40 mg Oral Q1200 Erlene Quan, PA   40 mg at 08/24/11 1229  . polyethylene glycol (MIRALAX / GLYCOLAX) packet 17 g  17 g Oral BID Doreene Burke [place], PA   17 g at 08/22/11 2230  . sodium chloride (OCEAN) 0.65 % nasal spray 1 spray  1 spray Each Nare PRN Lennis Marion Downer, MD        PE: General appearance: alert, cooperative and no distress Lungs:  clear to auscultation bilaterally Heart: regular rate and rhythm, 2/6 systolic MM at LSB Abdomen: +BS, Nontender. 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  History of pulmonary embolism, recurrent, 3 seperate episodes.  Presence of IVC filter, placed June 2011 after 3d PE  Pulmonary HTN, Moderate  PA pressure 14mmHG 09/2009  Anticoagulants causing adverse effect in therapeutic use, history of retinal bleeding on Coumadin  Chronic renal  insufficiency, stage IV (severe) - due to DM & HTN  Morbid obesity, (no history of sleep apnea evaluation)  HTN (hypertension)  HLD (hyperlipidemia)  Sinus bradycardia  Positive D dimer- 11.17, VQ low prob, LE venous doppler neg  Diastolic dysfunction, grade 2, EF 65-70% May 2013  Rectal bleeding, GI w/u- rectal fissure  UTI (urinary tract infection)   LBBB (left bundle branch block)  Thrombocytopenia, suspected HIT  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  Plan:  Net fluids -940 yesterday with increased PO Lasix.  Hgb stable at 8.0.   Microalbumin elevated in urine.   Also large blood and proteinuria.  Starting Cipro 500mg  BID x 7 days.  Hold PM Lasix and tomorrows AM Lasix.  Fluid restrict to 1200cc/day.  ACE wraps to both legs.   Consult IM.   LOS: 12 days    Perle Brickhouse W 08/25/2011 9:34 AM  ATTENDING ATTESTATION:  I have seen and examined the patient along with Tarri Fuller, PA.  I have reviewed the chart, notes and new data.  I agree with Bryan's note.  Brief Description: Very complex morbidly obese patient with DM - with proteinuria, prior DVT/PE s/p IVC filter (no anticoagulation due to - retinal hemorrhage; admitted with CP - negative V/Q scan for PE & negative Myoview -- no clear etiology of CP.  Wgt up since admission with tense LE edema & major c/o leg & foot pain.  Latest UA c/w UTI & mid range (moderate) microalbuminuria (serum albumin is 3.4 - just below normal) -- not eating.   Key new complaints: Legs hurt, no appetite  Key examination changes: 2-3+ LE edema with chronic changes venous stasis changes.  No JVD  & liver not enlarged or pulsatile   Key new findings / data: Cr up (not unexpected with diuresis for R-sided / PHTN & venous stasis (post-phlebitis syndrome) & diuresis.  PLAN:  Ace wrap for legs with SCDs (should alleviate discomfort) - SCDs also for DVT prophylaxis  Back off on IV diuresis as per above - restart tomorrow (to allow for re-distribution of 3rd spaced fluid to  vascular system);  Nutrition consult -- borderline protein malnutrition, that is not helping her fluid retention.  Rx UTI with Cipro  Need to be OOB & ambulate - hopefully with mobilization of edema, this will help.    Will need anticoagulation issue addressed, but for now holding off on Heparin products despite negative PAF-4; may re-consider warfarin without bridging as an OP. -- SCDs for  DT prophylaxis   Monitor bowel function - ? If constipation is one reason for decreased appetite (vs. Possibly gastroparesis) - on Miralax & colace with PRNs; avoid narcotics if possible.  BP & HR stable -- ARB held due to worsening renal function (but may well benefit from re-institution of ARB in setting of diabetic proteinuria)  PPI for GI prophylaxis  On synthroid at home dose   Consider consulting [facility] for assistance as many of her symptoms & conditions are non-cardiac in nature.    Leonie Man, M.D., M.S. [facility] [address]. [place], [place]  [id number]  [phone]  08/25/2011 9:34 AM

## 2011-08-26 DIAGNOSIS — R609 Edema, unspecified: Secondary | ICD-10-CM | POA: Diagnosis present

## 2011-08-26 DIAGNOSIS — Z86718 Personal history of other venous thrombosis and embolism: Secondary | ICD-10-CM

## 2011-08-26 LAB — URINE CULTURE
Colony Count: >=100000
Culture  Setup Time: 201305200144

## 2011-08-26 LAB — CBC
HCT: 21.6 % — ABNORMAL LOW (ref 36.0–46.0)
Platelets: 131 10*3/uL — ABNORMAL LOW (ref 150–400)
RBC: 2.33 MIL/uL — ABNORMAL LOW (ref 3.87–5.11)
RDW: 14 % (ref 11.5–15.5)
WBC: 9 10*3/uL (ref 4.0–10.5)

## 2011-08-26 LAB — GLUCOSE, CAPILLARY
Glucose-Capillary: 170 mg/dL — ABNORMAL HIGH (ref 70–99)
Glucose-Capillary: 238 mg/dL — ABNORMAL HIGH (ref 70–99)
Glucose-Capillary: 194 mg/dL — ABNORMAL HIGH (ref 70–99)

## 2011-08-26 LAB — PREPARE RBC (CROSSMATCH)

## 2011-08-26 NOTE — Progress Notes (Signed)
RN contacted PA on-call Kerin Ransom concerning pt's rising temp ( 99.5, 100.1, 100.7) duirng blood transfusion. Per PA, ok to give pt 2 tylenol. Will continue to monitor.

## 2011-08-26 NOTE — Progress Notes (Signed)
Transfusion started at 1353,

## 2011-08-26 NOTE — Progress Notes (Signed)
*  PRELIMINARY RESULTS* Vascular Ultrasound Lower extremity venous duplex has been completed.  Preliminary findings: Right= Evidence of DVT involving the common femoral vein and saphenofemoral junction. Left= Evidence of DVT involving the common femoral vein, saphenofemoral junction, femoral vein, popliteal vein, and posterior tibial veins.  Landry Mellow, RDMS 08/26/2011, 4:28 PM

## 2011-08-26 NOTE — Plan of Care (Signed)
Problem: Discharge Progression Outcomes Goal: Barriers To Progression Addressed/Resolved Outcome: Completed/Met Date Met:  08/26/11 Pt received 1 unit blood.

## 2011-08-26 NOTE — Progress Notes (Signed)
RN contacted Kerin Ransom, PA in regards to preliminary results of venous doppler exam. [facility] 08/26/2011 16:45

## 2011-08-26 NOTE — Progress Notes (Signed)
Subjective:  C/O pain in her legs, she is eating some, she denies SOB  Objective:  Vital Signs in the last 24 hours: Temp:  [98.4 F (36.9 C)-100.5 F (38.1 C)] 98.4 F (36.9 C) (05/21 0416) Pulse Rate:  [69-80] 69  (05/21 0416) Resp:  [18] 18  (05/21 0416) BP: (110-130)/(58-69) 110/58 mmHg (05/21 0416) SpO2:  [96 %-97 %] 97 % (05/21 0416) Weight:  [134.265 kg (296 lb)] 134.265 kg (296 lb) (05/21 0416)  Intake/Output from previous day:  Intake/Output Summary (Last 24 hours) at 08/26/11 0845 Last data filed at 08/26/11 0300  Gross per 24 hour  Intake    360 ml  Output    500 ml  Net   -140 ml    Physical Exam: General appearance: alert, cooperative and no distress Lungs: clear to auscultation bilaterally Heart: regular rate and rhythm Bilat LE painful edema   Rate: 70  Rhythm: normal sinus rhythm  Lab Results:  Basename 08/26/11 0605 08/25/11 0630  WBC 9.0 8.4  HGB 7.3* 8.0*  PLT 131* 123*    Basename 08/25/11 0630 08/24/11 1125  NA 131* --  K 5.0 --  CL 95* --  CO2 22 --  GLUCOSE 205* --  BUN 60* --  CREATININE 2.27* 2.27*   No results found for this basename: TROPONINI:2,CK,MB:2 in the last 72 hours Hepatic Function Panel No results found for this basename: PROT,ALBUMIN,AST,ALT,ALKPHOS,BILITOT,BILIDIR,IBILI in the last 72 hours No results found for this basename: CHOL in the last 72 hours No results found for this basename: INR in the last 72 hours  Imaging: Imaging results have been reviewed  Cardiac Studies:  Assessment/Plan:   Principal Problem:  *Chest pain, admitting complaint, MI r/o, Myoview low risk Active Problems:  Anemia - due to GI bleed & chronic disease.  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  Positive D dimer- 11.17, VQ low prob, LE venous doppler neg  Diastolic dysfunction, grade 2, EF 65-70% May 2013  Anticoagulants causing adverse effect in therapeutic use, history of retinal bleeding on Coumadin  Chronic renal insufficiency, stage IV (severe) - due to DM & HTN  Thrombocytopenia, suspected HIT  Morbid obesity, (no history of sleep apnea evaluation)  HTN (hypertension)  HLD (hyperlipidemia)  Presence of IVC filter, placed June 2011 after 3d PE  Pulmonary HTN, Moderate  PA pressure 21mmHG 09/2009  Sinus bradycardia  LBBB (left bundle branch block)  Rectal bleeding, GI w/u- rectal fissure  UTI (urinary tract infection) Cipro started 5/19   Plan- will wrap her legs.Not sure what we could do if her IVC is occluded. She needs to be transfused.   Kerin Ransom PA-C 08/26/2011, 8:45 AM    Agree with note written by Kerin Ransom [facility]  Difficult situation. Pt admitted 2 weeks ago with CP/SOB. Enz neg. D-Dimer ++ but V/Q negative. 2D myoview neg.  Major issues now are increasing Scr, decreasing Hgb and worsening peripheral edema. I'm concerned that this may be secondary to occluded IVC filter but unable to do MR venogram because of shortage of gadolinium. Even if occluded I'm not sure what the options would be other then thrombolysis. May benefit from venogram by IR to at least answer that question with limited contrast. It will be difficult to diurese without not worsening real function.  Lorretta Harp 08/26/2011 8:53 AM

## 2011-08-26 NOTE — Progress Notes (Signed)
I spoke with interventional Radiology. They would like to repeat venous dopplers of the lower extremities. If there is DVT then we can assume IVC is clotted. If scan is negative for DVT then proceed to "non contrast Pelvic MRI venogram" to r/o pelvic DVT. She may be a candidate for thrombolysis based on the above tests.   Kerin Ransom PA-C 08/26/2011 12:32 PM

## 2011-08-27 DIAGNOSIS — I8010 Phlebitis and thrombophlebitis of unspecified femoral vein: Secondary | ICD-10-CM

## 2011-08-27 LAB — COMPREHENSIVE METABOLIC PANEL
ALT: 17 U/L (ref 0–35)
AST: 15 U/L (ref 0–37)
Albumin: 2.6 g/dL — ABNORMAL LOW (ref 3.5–5.2)
Alkaline Phosphatase: 74 U/L (ref 39–117)
BUN: 77 mg/dL — ABNORMAL HIGH (ref 6–23)
CO2: 20 mEq/L (ref 19–32)
Calcium: 9.1 mg/dL (ref 8.4–10.5)
Chloride: 96 mEq/L (ref 96–112)
Creatinine, Ser: 2.52 mg/dL — ABNORMAL HIGH (ref 0.50–1.10)
GFR calc Af Amer: 24 mL/min — ABNORMAL LOW (ref >90)
GFR calc non Af Amer: 20 mL/min — ABNORMAL LOW (ref >90)
Glucose, Bld: 192 mg/dL — ABNORMAL HIGH (ref 70–99)
Potassium: 4.8 mEq/L (ref 3.5–5.1)
Sodium: 131 mEq/L — ABNORMAL LOW (ref 135–145)
Total Bilirubin: 0.3 mg/dL (ref 0.3–1.2)
Total Protein: 6.7 g/dL (ref 6.0–8.3)

## 2011-08-27 LAB — GLUCOSE, CAPILLARY
Glucose-Capillary: 204 mg/dL — ABNORMAL HIGH (ref 70–99)
Glucose-Capillary: 179 mg/dL — ABNORMAL HIGH (ref 70–99)
Glucose-Capillary: 169 mg/dL — ABNORMAL HIGH (ref 70–99)

## 2011-08-27 LAB — CBC
HCT: 24.6 % — ABNORMAL LOW (ref 36.0–46.0)
Hemoglobin: 8.2 g/dL — ABNORMAL LOW (ref 12.0–15.0)
MCH: 30.4 pg (ref 26.0–34.0)
MCHC: 33.3 g/dL (ref 30.0–36.0)
MCV: 91.1 fL (ref 78.0–100.0)
Platelets: 155 10*3/uL (ref 150–400)
RBC: 2.7 MIL/uL — ABNORMAL LOW (ref 3.87–5.11)
RDW: 14.9 % (ref 11.5–15.5)
WBC: 8.6 10*3/uL (ref 4.0–10.5)

## 2011-08-27 LAB — TYPE AND SCREEN
ABO/RH(D): A POS
Antibody Screen: NEGATIVE
Unit division: 0

## 2011-08-27 LAB — MISCELLANEOUS TEST

## 2011-08-27 MED ORDER — FUROSEMIDE 80 MG PO TABS
80.0000 mg | ORAL_TABLET | Freq: Every day | ORAL | Status: DC
  Administered 2011-08-27 – 2011-09-04 (×9): 80 mg via ORAL
  Filled 2011-08-27 (×9): qty 1

## 2011-08-27 NOTE — Consult Note (Signed)
Vascular Surgery Consultation  Reason for Consult: Bilateral DVT with IVC filter and history of pulmonary embolus x3  HPI: [name] is a 56 y.o. female who presents for evaluation of bilateral DVT left greater than right with history of 3 pulmonary emboli and IVC filter placement. Patient has been in the hospital with initial suspicion for pulmonary embolus but no new pulmonary embolus was diagnosed. She was treated with heparin but began having some bleeding episodes and this was discontinued. Over the past few days she has developed significant edema and bilateral venous duplex exam today revealed DVT in the right common femoral vein and saphenofemoral junction and on the left side in the common femoral vein saphenofemoral junction and superficial femoral vein. Patient had IVC filter placed a few years ago after her last pulmonary embolus. She was not maintained on Coumadin after that but only one aspirin. As had complications with retinal bleeding from Coumadin. During this hospitalization she has had low platelet counts in the 100,000 range thought to possibly be due to heparin. Currently she is not on anticoagulants. She also has history of renal insufficiency but has not required hemodialysis. Consultation was requested regarding possible surgical thrombectomy versus thrombolyzes for extensive DVT and possible thrombus at IVC filter site although this is not documented.   Past Medical History  Diagnosis Date  . CHF (congestive heart failure)   . Diabetes mellitus   . Arthritis   . Hypertension   . Depression   . DVT (deep venous thrombosis) 10 years ago  . PE (pulmonary thromboembolism) 3 years ag0   Past Surgical History  Procedure Date  . Eye surgery   . Cholecystectomy   . Cesarean section   . Ivc     ivc filter   History   Social History  . Marital Status: Divorced    Spouse Name: N/A    Number of Children: N/A  . Years of Education: N/A   Social History Main  Topics  . Smoking status: Passive Smoker  . Smokeless tobacco: None  . Alcohol Use: No  . Drug Use: No  . Sexually Active: No   Other Topics Concern  . None   Social History Narrative  . None   No family history on file. Allergies  Allergen Reactions  . Morphine And Related Rash   Prior to Admission medications   Medication Sig Start Date End Date Taking? Authorizing Provider  aspirin EC 81 MG tablet Take 81 mg by mouth daily.   Yes Historical Provider, MD  citalopram (CELEXA) 20 MG tablet Take 20 mg by mouth at bedtime.   Yes Historical Provider, MD  diazepam (VALIUM) 5 MG tablet Take 5 mg by mouth at bedtime as needed. For muscle spasms.   Yes Historical Provider, MD  furosemide (LASIX) 80 MG tablet Take 80 mg by mouth 2 (two) times daily.   Yes Historical Provider, MD  glimepiride (AMARYL) 4 MG tablet Take 4 mg by mouth daily before breakfast.   Yes Historical Provider, MD  HYDROcodone-acetaminophen (VICODIN) 5-500 MG per tablet Take 1 tablet by mouth every 8 (eight) hours as needed. For pain.   Yes Historical Provider, MD  insulin glargine (LANTUS) 100 UNIT/ML injection Inject 17-21 Units into the skin See admin instructions. She uses a sliding scale.   Yes Historical Provider, MD  levothyroxine (SYNTHROID, LEVOTHROID) 25 MCG tablet Take 25 mcg by mouth daily.   Yes Historical Provider, MD  metFORMIN (GLUCOPHAGE) 1000 MG tablet Take 1,000 mg by  mouth 2 (two) times daily with a meal.   Yes Historical Provider, MD  metoprolol (LOPRESSOR) 50 MG tablet Take 50 mg by mouth 2 (two) times daily.   Yes Historical Provider, MD  Multiple Vitamin (MULITIVITAMIN WITH MINERALS) TABS Take 1 tablet by mouth daily.   Yes Historical Provider, MD  olmesartan (BENICAR) 20 MG tablet Take 20 mg by mouth daily.   Yes Historical Provider, MD  Omega-3 Fatty Acids (FISH OIL) 1200 MG CAPS Take 1 capsule by mouth 2 (two) times daily.   Yes Historical Provider, MD  OVER THE COUNTER MEDICATION Take 3 tablets  by mouth 2 (two) times daily. Raspberry Ketones.   Yes Historical Provider, MD  OVER THE COUNTER MEDICATION Take 2 tablets by mouth daily. Cherry 1000mg  tablets.   Yes Historical Provider, MD  spironolactone (ALDACTONE) 25 MG tablet Take 25 mg by mouth daily.   Yes Historical Provider, MD     Positive ROS: See history and physical  All other systems have been reviewed and were otherwise negative with the exception of those mentioned in the HPI and as above.  Physical Exam: Filed Vitals:   08/27/11 1334  BP: 124/70  Pulse: 57  Temp: 98.5 F (36.9 C)  Resp: 18    General: Alert, no acute distress HEENT: Normal for age Cardiovascular: Regular rate and rhythm. Carotid pulses 2+, no bruits audible Respiratory: Clear to auscultation. No cyanosis, no use of accessory musculature GI: No organomegaly, abdomen is soft and non-tender-morbidly obese Skin: No lesions in the area of chief complaint-severe hyperpigmentation bilaterally in lower third of both legs with no active ulceration  Neurologic: Sensation intact distally Psychiatric: Patient is competent for consent with normal mood and affect Musculoskeletal: No obvious deformities Extremities: 3+ femoral and dorsalis pedis pulses palpable bilaterally. 2+ edema throughout both lower extremities from toes to mid thighs. No active ulcerations noted. Legs in dependent position. Motion and sensation of feet intact bilaterally. No evidence of ischemia.    Imaging reviewed: Duplex scan of lower extremities reveals bilateral lower extremity DVT-left worse than right   Assessment/Plan: #1 bilateral DVT while in the hospital-currently not on anticoagulants-history of pulmonary embolus x3 with IVC filter in place #2 chronic renal insufficiency with diabetes mellitus and hypertension #3 possible HIT  with decreased platelet count-120,000 #4 morbid obesity #5 history of retinal bleeding while on Coumadin   Recommendation    There is no role  for surgical thrombectomy in this patient. IVC filter may be thrombosed and cava may be thrombosed but thrombectomy would not be indicated in this role with IVC filter in place or otherwise.     Only treatment for this available would be from lysis by interventional radiology-problem obviously is chronic renal insufficiency and need for contrast with possible worsening of renal function     Patient needs effective elevation of legs in bed to assist with significant edema bilaterally     Consider thrombin inhibitors for anti-coagulation-would ask for hematology input on this  I discussed this with patient  Tinnie Gens, MD 08/27/2011 6:44 PM

## 2011-08-27 NOTE — Progress Notes (Signed)
Subjective:  Leg pain and swelling, she could not tolerate ACE wrap.  Objective:  Vital Signs in the last 24 hours: Temp:  [99 F (37.2 C)-100.7 F (38.2 C)] 99 F (37.2 C) (05/22 0444) Pulse Rate:  [58-70] 58  (05/22 0444) Resp:  [16-18] 16  (05/22 0444) BP: (95-124)/(52-86) 103/66 mmHg (05/22 0444) SpO2:  [94 %-99 %] 94 % (05/22 0444) Weight:  [138.529 kg (305 lb 6.4 oz)] 138.529 kg (305 lb 6.4 oz) (05/22 0449)  Intake/Output from previous day:  Intake/Output Summary (Last 24 hours) at 08/27/11 1332 Last data filed at 08/27/11 0556  Gross per 24 hour  Intake    500 ml  Output    675 ml  Net   -175 ml    Physical Exam: General appearance: alert, cooperative, no distress and morbidly obese Lungs: decreased breath sounds Heart: regular rate and rhythm bilat lower extremity edema and chronic changes   Rate: 60  Rhythm: normal sinus rhythm  Lab Results:  Basename 08/27/11 0625 08/26/11 0605  WBC 8.6 9.0  HGB 8.2* 7.3*  PLT 155 131*    Basename 08/27/11 0625 08/25/11 0630  NA 131* 131*  K 4.8 5.0  CL 96 95*  CO2 20 22  GLUCOSE 192* 205*  BUN 77* 60*  CREATININE 2.52* 2.27*   No results found for this basename: TROPONINI:2,CK,MB:2 in the last 72 hours Hepatic Function Panel  Basename 08/27/11 0625  PROT 6.7  ALBUMIN 2.6*  AST 15  ALT 17  ALKPHOS 74  BILITOT 0.3  BILIDIR --  IBILI --   No results found for this basename: CHOL in the last 72 hours No results found for this basename: INR in the last 72 hours  Imaging: Imaging results have been reviewed  Cardiac Studies:  Assessment/Plan:   Principal Problem:  *Chest pain, admitting complaint, MI r/o, Myoview low risk Active Problems:  Anemia - due to GI bleed & chronic disease.  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  Positive D dimer- 11.17, VQ low prob, LE venous doppler neg  Diastolic dysfunction, grade 2, EF 65-70% May 2013  Anticoagulants causing adverse effect in therapeutic use, history of retinal bleeding on Coumadin  Chronic renal insufficiency, stage IV (severe) - due to DM & HTN  Thrombocytopenia, suspected HIT  Morbid obesity, (no history of sleep apnea evaluation)  HTN (hypertension)  HLD (hyperlipidemia)  Presence of IVC filter, placed June 2011 after 3d PE  Pulmonary HTN, Moderate  PA pressure 53mmHG 09/2009  Sinus bradycardia  LBBB (left bundle branch block)  Rectal bleeding, GI w/u- rectal fissure  UTI (urinary tract infection)   Plan- Venous dopplers show diffuse LE venous clot bilat. She required transfusion yesterday. Will need to discuss with IR and possibly Heme to see what we may be able to offer her. ? Thrombolysis. Lasix changed to po because of IV access.    Kerin Ransom PA-C 08/27/2011, 1:32 PM

## 2011-08-27 NOTE — Progress Notes (Signed)
Inpatient Diabetes Program Recommendations  AACE/ADA: New Consensus Statement on Inpatient Glycemic Control (2009)  Target Ranges:  Prepandial:   less than 140 mg/dL      Peak postprandial:   less than 180 mg/dL (1-2 hours)      Critically ill patients:  140 - 180 mg/dL   Reason for Visit: Hyperglycemia  Inpatient Diabetes Program Recommendations Insulin - Basal: Pt takes 17-21 units Lantus at home. Please increse dose here to 15 units. Insulin - Meal Coverage: Please add 3 units meal coverage tidwc which will  be given only if pt eats at least 50%.  Would rather use than Amaryl which will affect glucose all day whether pt eats or not.  Note: Thank you, Rosita [name], RN, CNS, Diabetes Coordinator [phone])

## 2011-08-27 NOTE — Progress Notes (Signed)
Pt. Seen and examined. Agree with the NP/PA-C note as written.  Dopplers show bilateral LE DVT which is extensive. There is now more obvious concern for IVC filter thrombus. I had raised the possibility of this last week, however, after reviewing her abdominal CT with the radiologist, he suggested it did not look thrombosed. In addition, it was difficult to know what test could be safely be performed due to her thrombocytopenia. At this point, I think an attempt needs to be made to access the LE vasculature for possible site-directed thrombolysis or thrombectomy. I agree with discussion with IR and would also consult vascular surgery for their opinion.  Pixie Casino, MD, [facility] Attending Cardiologist [facility] [place]

## 2011-08-28 ENCOUNTER — Inpatient Hospital Stay (HOSPITAL_COMMUNITY): Payer: Medicaid Other

## 2011-08-28 LAB — BASIC METABOLIC PANEL
BUN: 83 mg/dL — ABNORMAL HIGH (ref 6–23)
CO2: 21 mEq/L (ref 19–32)
Calcium: 9 mg/dL (ref 8.4–10.5)
Chloride: 96 mEq/L (ref 96–112)
Creatinine, Ser: 2.38 mg/dL — ABNORMAL HIGH (ref 0.50–1.10)
GFR calc Af Amer: 25 mL/min — ABNORMAL LOW (ref >90)
GFR calc non Af Amer: 22 mL/min — ABNORMAL LOW (ref >90)
Glucose, Bld: 176 mg/dL — ABNORMAL HIGH (ref 70–99)
Potassium: 4.9 mEq/L (ref 3.5–5.1)
Sodium: 131 mEq/L — ABNORMAL LOW (ref 135–145)

## 2011-08-28 LAB — CBC
HCT: 25.3 % — ABNORMAL LOW (ref 36.0–46.0)
Hemoglobin: 8.5 g/dL — ABNORMAL LOW (ref 12.0–15.0)
MCH: 30.8 pg (ref 26.0–34.0)
MCHC: 33.6 g/dL (ref 30.0–36.0)
MCV: 91.7 fL (ref 78.0–100.0)
Platelets: 149 10*3/uL — ABNORMAL LOW (ref 150–400)
RBC: 2.76 MIL/uL — ABNORMAL LOW (ref 3.87–5.11)
RDW: 14.6 % (ref 11.5–15.5)
WBC: 7.3 10*3/uL (ref 4.0–10.5)

## 2011-08-28 LAB — GLUCOSE, CAPILLARY: Glucose-Capillary: 168 mg/dL — ABNORMAL HIGH (ref 70–99)

## 2011-08-28 MED ORDER — HEPARIN (PORCINE) IN NACL 100-0.45 UNIT/ML-% IJ SOLN
1200.0000 [IU]/h | INTRAMUSCULAR | Status: DC
  Filled 2011-08-28: qty 250

## 2011-08-28 MED ORDER — HEPARIN (PORCINE) IN NACL 100-0.45 UNIT/ML-% IJ SOLN
1500.0000 [IU]/h | INTRAMUSCULAR | Status: DC
  Administered 2011-08-28: 1200 [IU]/h via INTRAVENOUS
  Administered 2011-08-30: 1750 [IU]/h via INTRAVENOUS
  Administered 2011-08-31 – 2011-09-01 (×3): 1700 [IU]/h via INTRAVENOUS
  Filled 2011-08-28 (×10): qty 250

## 2011-08-28 MED ORDER — SODIUM CHLORIDE 0.9 % IJ SOLN
10.0000 mL | INTRAMUSCULAR | Status: DC | PRN
  Administered 2011-08-30 – 2011-09-04 (×2): 10 mL

## 2011-08-28 MED ORDER — HEPARIN (PORCINE) IN NACL 100-0.45 UNIT/ML-% IJ SOLN
1200.0000 [IU]/h | INTRAMUSCULAR | Status: DC
  Administered 2011-08-28: 1200 [IU]/h via INTRAVENOUS
  Filled 2011-08-28 (×2): qty 250

## 2011-08-28 MED ORDER — HEPARIN BOLUS VIA INFUSION
1000.0000 [IU] | Freq: Once | INTRAVENOUS | Status: DC
  Filled 2011-08-28: qty 1000

## 2011-08-28 MED ORDER — ENOXAPARIN SODIUM 150 MG/ML ~~LOC~~ SOLN
1.0000 mg/kg | Freq: Two times a day (BID) | SUBCUTANEOUS | Status: DC
  Filled 2011-08-28 (×2): qty 1

## 2011-08-28 NOTE — Progress Notes (Signed)
UR Completed/ [place], [place]

## 2011-08-28 NOTE — Progress Notes (Signed)
[facility]  Subjective: Legs feel a little better.  Objective: Vital signs in last 24 hours: Temp:  [98.5 F (36.9 C)-98.8 F (37.1 C)] 98.8 F (37.1 C) (05/23 0525) Pulse Rate:  [55-63] 63  (05/23 0525) Resp:  [18] 18  (05/23 0525) BP: (115-127)/(65-70) 127/66 mmHg (05/23 0525) SpO2:  [93 %-97 %] 97 % (05/23 0525) Weight:  [126 kg (277 lb 12.5 oz)] 126 kg (277 lb 12.5 oz) (05/23 0525) Last BM Date: 08/26/11  Intake/Output from previous day: 05/22 0701 - 05/23 0700 In: -  Out: 600 [Urine:600] Intake/Output this shift: Total I/O In: 360 [P.O.:360] Out: -   Medications Current Facility-Administered Medications  Medication Dose Route Frequency Provider Last Rate Last Dose  . acetaminophen (TYLENOL) tablet 650 mg  650 mg Oral Q4H PRN Tarri Fuller, PA   650 mg at 08/26/11 1526  . aspirin EC tablet 81 mg  81 mg Oral Daily Tarri Fuller, PA   81 mg at 08/27/11 1013  . bisacodyl (DULCOLAX) EC tablet 5 mg  5 mg Oral Daily PRN Erlene Quan, PA   5 mg at 08/19/11 1810  . bisacodyl (DULCOLAX) suppository 10 mg  10 mg Rectal Daily PRN Sanda Klein, MD   10 mg at 08/20/11 0820  . ciprofloxacin (CIPRO) tablet 500 mg  500 mg Oral BID Tarri Fuller, PA   500 mg at 08/28/11 0742  . citalopram (CELEXA) tablet 20 mg  20 mg Oral QHS Tarri Fuller, PA   20 mg at 08/27/11 2121  . diazepam (VALIUM) tablet 5 mg  5 mg Oral QHS PRN Tarri Fuller, PA   5 mg at 08/21/11 2201  . docusate sodium (COLACE) capsule 200 mg  200 mg Oral Daily Juanita Craver, MD   200 mg at 08/27/11 1013  . feeding supplement (GLUCERNA SHAKE) liquid 237 mL  237 mL Oral BID BM [place] Bussing, RD   237 mL at 08/26/11 1004  . feeding supplement (PRO-STAT SUGAR FREE 64) liquid 30 mL  30 mL Oral BID WC Rogue Bussing, RD   30 mL at 08/28/11 [id number]  . fluticasone (FLONASE) 50 MCG/ACT nasal spray 1 spray  1 spray Each Nare QID PRN Lennis P [place], MD      . furosemide (LASIX) tablet 80 mg  80 mg  Oral Daily Doreene Burke [place], PA   80 mg at 08/27/11 1408  . HYDROcodone-acetaminophen (NORCO) 5-325 MG per tablet 1-2 tablet  1-2 tablet Oral Q6H PRN Erlene Quan, PA   2 tablet at 08/28/11 0419  . insulin aspart (novoLOG) injection 0-9 Units  0-9 Units Subcutaneous TID WC Erlene Quan, PA   2 Units at 08/28/11 0700  . insulin glargine (LANTUS) injection 9 Units  9 Units Subcutaneous QHS Doreene Burke [place], [place]   9 Units at 08/27/11 2121  . levothyroxine (SYNTHROID, LEVOTHROID) tablet 50 mcg  50 mcg Oral QAC breakfast Doreene Burke [place], [place]   50 mcg at 08/28/11 [id number]  . loratadine (CLARITIN) tablet 10 mg  10 mg Oral Daily PRN Lennis Marion Downer, MD      . omega-3 acid ethyl esters (LOVAZA) capsule 1 g  1 g Oral BID Tarri Fuller, PA   1 g at 08/27/11 2121  . ondansetron (ZOFRAN) injection 4 mg  4 mg Intravenous Q6H PRN Tarri Fuller, PA   4 mg at 08/21/11 0018  . pantoprazole (PROTONIX) EC tablet 40 mg  40 mg Oral Q1200 Erlene Quan,  PA   40 mg at 08/27/11 1159  . polyethylene glycol (MIRALAX / GLYCOLAX) packet 17 g  17 g Oral BID Doreene Burke [place], PA   17 g at 08/22/11 2230  . sodium chloride (OCEAN) 0.65 % nasal spray 1 spray  1 spray Each Nare PRN Lennis Marion Downer, MD      . DISCONTD: furosemide (LASIX) injection 80 mg  80 mg Intravenous q morning - 10a Leonie Man, MD        PE: General appearance: alert, cooperative and no distress Lungs: clear to auscultation bilaterally Heart: regular rate and rhythm Extremities: 2-3+ LEE.  Greater on the left. 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  History of pulmonary embolism, recurrent, 3 seperate episodes.  Presence of IVC filter, placed June 2011 after 3d PE  Pulmonary HTN, Moderate  PA pressure 25mmHG 09/2009  Sinus bradycardia  Positive D dimer- 11.17, VQ low prob, LE venous doppler neg  Diastolic dysfunction, grade 2, EF 65-70% May 2013  LBBB (left bundle branch block)  Anticoagulants causing adverse effect in therapeutic use, history of retinal bleeding on Coumadin  Chronic renal insufficiency, stage IV (severe) - due to DM & HTN  Rectal bleeding, GI w/u- rectal fissure  Thrombocytopenia, suspected HIT  UTI (urinary tract infection)   Edema, possible occluded IVC filter   Plan:  Bilateral DVT.  Weight has decreased ~26# but output does not match.  Lasix at 80mg  PO daily.  Vascular Surgery has seen her and states no role for surgical thrombectomy.  Will ask hematology to see again for DVT therapy recommendations.  S/P one unit PRBCs.  Hgb up to 8.5.  Platelet antibody testing was negative.   ? Starting heparin.    LOS: 15 days    HAGER, BRYAN 08/28/2011 9:14 AM  IV Heparin ordered HAGER, BRYAN 1:10 PM  Agree with note written by Luisa Dago PAC  No CP/SOB. Edema decreasing. Agree with Dr. Agustina Caroli recommendation about IV hep but then will ultimately need to transition to coumadin A/C. Continue to diurese. OT/PT.   Lorretta Harp 08/28/2011 3:04 PM

## 2011-08-28 NOTE — Progress Notes (Signed)
ANTICOAGULATION CONSULT - FOLLOW UP   Informed by RN that heparin gtt was stopped due to loss of IV access.  PICC line being placed right now and heparin gtt to resume by 2200.  Will resume previous order and continue to follow.   Jaxsin Bottomley D. Mina Marble, PharmD, BCPS Pager:  [phone] 08/28/2011, 9:05 PM

## 2011-08-28 NOTE — Progress Notes (Signed)
ANTICOAGULATION CONSULT NOTE - Follow Up Consult  Pharmacy Consult for heparin Indication: h/o multiple PEs, IVC filter placed 09/2009  Vital Signs: Temp: 98.5 F (36.9 C) (05/23 1420) Temp src: Oral (05/23 1420) BP: 113/59 mmHg (05/23 1420) Pulse Rate: 65  (05/23 1420)  Labs:  Basename 08/28/11 0555 08/27/11 0625 08/26/11 0605  HGB 8.5* 8.2* --  HCT 25.3* 24.6* 21.6*  PLT 149* 155 131*  APTT -- -- --  LABPROT -- -- --  INR -- -- --  HEPARINUNFRC -- -- --  CREATININE 2.38* 2.52* --  CKTOTAL -- -- --  CKMB -- -- --  TROPONINI -- -- --    Estimated Creatinine Clearance: 34.5 ml/min (by C-G formula based on Cr of 2.38).  Medications:  Infusions:     . heparin    . DISCONTD: heparin     Assessment: 40 yof started on heparin for chest pain initially.  She has a h/o multiple PEs with IVC filter placement 09/2009.  She was not on anticoagulation due to bleeding with coumadin.  She was found to have bilateral DVTs and heparin was started but she had some thrombocytopenia and bleeding and heparin was stopped.  Heparin is to restart today.  Her HIT was negative. Previously therapeutic on 1200 units/hr heparin.   Goal of Therapy:  Heparin level 0.3-0.7 units/ml Monitor platelets by anticoagulation protocol: Yes   Plan:  1. Resume heparin at 1200units/hr 2.  Will give small bolus of 1000 units due to bleeding history. 3. Follow up heparin level in 8 hours. 4.  CBC and heparin level daily while on heparin.  Savera Donson Poteet 08/28/2011,3:30 PM   Addum:  Change to lovenox 125 mg sq q12 hours. Addum:  Change back to heparin.  Same plan as above.

## 2011-08-28 NOTE — Consult Note (Signed)
[facility] INPATIENT PROGRESS NOTE  Name: [name]      MRN: [id number]    Location: 2039/2039-01  Date: 08/28/2011 Time:12:54 PM   Subjective: Interval History:[name] complained of bilateral leg swelling the last 3 days.  They have swollen to the point where she cannot walk anymore.  The admitting symptoms of SOB, DOE have completely resolved.  She has not seen any more brisk rectal bleeding compared to earlier in the week.    Objective: Vital signs in last 24 hours: Temp:  [98.5 F (36.9 C)-98.8 F (37.1 C)] 98.8 F (37.1 C) (05/23 0525) Pulse Rate:  [55-63] 63  (05/23 0525) Resp:  [18] 18  (05/23 0525) BP: (115-127)/(65-70) 127/66 mmHg (05/23 0525) SpO2:  [93 %-97 %] 97 % (05/23 0525) Weight:  [277 lb 12.5 oz (126 kg)] 277 lb 12.5 oz (126 kg) (05/23 0525)     PHYSICAL EXAM:  Gen: obese woman lying in bed, in no acute distress. Eyes: No scleral icterus or jaundice. ENT: There was no oropharyngeal lesions. Neck was supple without thyromegaly. Lymphatics: Negative for cervical, supraclavicular, axillary, or inguinal adenopathy.  Respiratory: Lungs were clear bilaterally without wheezing or crackles. Cardiovascular: normal heart rate and rhythm; S1/S2; without murmur, rubs, or gallop.  Extremities:  2+ bilateral pitting, woody pedal edema with venous insufficiency skin discoloration in the bilateral shins.  GI: Abdomen was soft, obese, nontender, nondistended, without organomegaly. Musculoskeletal exam: No spinal tenderness on palpation of vertebral spine. Skin exam was without ecchymosis, petechiae.     Studies/Results: Results for orders placed during the hospital encounter of 08/13/11 (from the past 48 hour(s))  GLUCOSE, CAPILLARY     Status: Abnormal   Collection Time   08/26/11  4:05 PM      Component Value Range Comment   Glucose-Capillary 188 (*) 70 - 99 (mg/dL)    Comment 1 Notify RN      Comment 2 Documented in Chart     GLUCOSE, CAPILLARY      Status: Abnormal   Collection Time   08/26/11  9:38 PM      Component Value Range Comment   Glucose-Capillary 194 (*) 70 - 99 (mg/dL)   GLUCOSE, CAPILLARY     Status: Abnormal   Collection Time   08/27/11  5:54 AM      Component Value Range Comment   Glucose-Capillary 204 (*) 70 - 99 (mg/dL)   CBC     Status: Abnormal   Collection Time   08/27/11  6:25 AM      Component Value Range Comment   WBC 8.6  4.0 - 10.5 (K/uL)    RBC 2.70 (*) 3.87 - 5.11 (MIL/uL)    Hemoglobin 8.2 (*) 12.0 - 15.0 (g/dL)    HCT 24.6 (*) 36.0 - 46.0 (%)    MCV 91.1  78.0 - 100.0 (fL)    MCH 30.4  26.0 - 34.0 (pg)    MCHC 33.3  30.0 - 36.0 (g/dL)    RDW 14.9  11.5 - 15.5 (%)    Platelets 155  150 - 400 (K/uL)   COMPREHENSIVE METABOLIC PANEL     Status: Abnormal   Collection Time   08/27/11  6:25 AM      Component Value Range Comment   Sodium 131 (*) 135 - 145 (mEq/L)    Potassium 4.8  3.5 - 5.1 (mEq/L)    Chloride 96  96 - 112 (mEq/L)    CO2 20  19 -  32 (mEq/L)    Glucose, Bld 192 (*) 70 - 99 (mg/dL)    BUN 77 (*) 6 - 23 (mg/dL)    Creatinine, Ser 2.52 (*) 0.50 - 1.10 (mg/dL)    Calcium 9.1  8.4 - 10.5 (mg/dL)    Total Protein 6.7  6.0 - 8.3 (g/dL)    Albumin 2.6 (*) 3.5 - 5.2 (g/dL)    AST 15  0 - 37 (U/L)    ALT 17  0 - 35 (U/L)    Alkaline Phosphatase 74  39 - 117 (U/L)    Total Bilirubin 0.3  0.3 - 1.2 (mg/dL)    GFR calc non Af Amer 20 (*) >90 (mL/min)    GFR calc Af Amer 24 (*) >90 (mL/min)   GLUCOSE, CAPILLARY     Status: Abnormal   Collection Time   08/27/11 10:58 AM      Component Value Range Comment   Glucose-Capillary 179 (*) 70 - 99 (mg/dL)    Comment 1 Notify RN      Comment 2 Documented in Chart     GLUCOSE, CAPILLARY     Status: Abnormal   Collection Time   08/27/11  4:14 PM      Component Value Range Comment   Glucose-Capillary 203 (*) 70 - 99 (mg/dL)    Comment 1 Notify RN      Comment 2 Documented in Chart     GLUCOSE, CAPILLARY     Status: Abnormal   Collection Time    08/27/11  9:15 PM      Component Value Range Comment   Glucose-Capillary 169 (*) 70 - 99 (mg/dL)   GLUCOSE, CAPILLARY     Status: Abnormal   Collection Time   08/28/11  5:23 AM      Component Value Range Comment   Glucose-Capillary 172 (*) 70 - 99 (mg/dL)   BASIC METABOLIC PANEL     Status: Abnormal   Collection Time   08/28/11  5:55 AM      Component Value Range Comment   Sodium 131 (*) 135 - 145 (mEq/L)    Potassium 4.9  3.5 - 5.1 (mEq/L)    Chloride 96  96 - 112 (mEq/L)    CO2 21  19 - 32 (mEq/L)    Glucose, Bld 176 (*) 70 - 99 (mg/dL)    BUN 83 (*) 6 - 23 (mg/dL)    Creatinine, Ser 2.38 (*) 0.50 - 1.10 (mg/dL)    Calcium 9.0  8.4 - 10.5 (mg/dL)    GFR calc non Af Amer 22 (*) >90 (mL/min)    GFR calc Af Amer 25 (*) >90 (mL/min)   CBC     Status: Abnormal   Collection Time   08/28/11  5:55 AM      Component Value Range Comment   WBC 7.3  4.0 - 10.5 (K/uL)    RBC 2.76 (*) 3.87 - 5.11 (MIL/uL)    Hemoglobin 8.5 (*) 12.0 - 15.0 (g/dL)    HCT 25.3 (*) 36.0 - 46.0 (%)    MCV 91.7  78.0 - 100.0 (fL)    MCH 30.8  26.0 - 34.0 (pg)    MCHC 33.6  30.0 - 36.0 (g/dL)    RDW 14.6  11.5 - 15.5 (%)    Platelets 149 (*) 150 - 400 (K/uL)   GLUCOSE, CAPILLARY     Status: Abnormal   Collection Time   08/28/11 11:34 AM      Component Value Range Comment  Glucose-Capillary 184 (*) 70 - 99 (mg/dL)    No results found.   MEDICATIONS: reviewed.     Assessment/Plan:  1.  Bilateral pedal edema: - Complicated presentation:  Woman with history of past lower extremity DVT; then PE's.  She was on Coumadin more than 10 years ago which resulted in occular hemorrhage.  Her coumadin was discontinued, and she had IVC filter placement.  She has been off of Coumadin despite having in permanent IVC filter present.  She now has bilateral lower extremity edema.  She recently had rectal bleed from rectal fissure requiring pRBC transfusion.  -Treatment options for the acute bilateral lower extremity DVT's:   The best option is thrombectomy given the recent GI bleed however vascular surgery deemed that unnecessary.  Thrombectomy with dye load may predispose patient to worsening renal insufficiency.  Therefore, by default, to relieve her symptomatic bilateral DVT, Heparin IV gtt is the best available option.  This will allow close monitoring in case she has recurrent rectal bleed.   Lovenox is not advised given her obesity and renal insufficiency.  I prefer not to use direct Xa inhibitor such as rivaroxaban due to recent GI bleed, and renal insufficiency.   2.  Acute blood loss anemia:  Most likely due to recent rectal bleed; compounded by anemia of chronic renal insufficiency.  However, I will also send for SPEP, serum free light chain to rule out myeloma as cause of her anemia/renal insufficiency.  Will need to monitor rectal fissure to ensure no recurrent bleed.   3.  Renal insufficiency:  Unclear etiology.  Most likely chronic hypertension given presence of grade 2 diastolic dysfunction.  However, again I will also send for SPEP, serum free light chain to rule out myeloma as cause of her anemia/renal insufficiency.  5.  Thrombocytopenia:  Resolved.  HIT was ruled out earlier during this hospital course.  6.  Grade 2 diastolic dysfunction:  She appeared to be compensated today.  She is on Lasix.   7.  Hypothyroidism:  On Synthroid.

## 2011-08-28 NOTE — Progress Notes (Signed)
Acknowledge order for heparin bolus and infusion however Pt has no IV access.  PA aware, and lovenox ordered instead.

## 2011-08-28 NOTE — Clinical Social Work Placement (Signed)
     Clinical Social Work Department CLINICAL SOCIAL WORK PLACEMENT NOTE 08/28/2011  Patient:  [name], [name]  Account Number:  [id number] Admit date:  08/13/2011  Clinical Social Worker:  Daiva Huge  Date/time:  08/28/2011 11:44 AM  Clinical Social Work is seeking post-discharge placement for this patient at the following level of care:   [place]   (*CSW will update this form in Epic as items are completed)   08/28/2011  Patient/family provided with [facility] Department of Clinical Social Works list of facilities offering this level of care within the geographic area requested by the patient (or if unable, by the patients family).  08/28/2011  Patient/family informed of their freedom to choose among providers that offer the needed level of care, that participate in Medicare, Medicaid or managed care program needed by the patient, have an available bed and are willing to accept the patient.  08/28/2011  Patient/family informed of [facility] ownership interest in [facility], as well as of the fact that they are under no obligation to receive care at this facility.  PASARR submitted to EDS on 08/28/2011 PASARR number received from [employer] on   FL2 transmitted to all facilities in geographic area requested by pt/family on   FL2 transmitted to all facilities within larger geographic area on   Patient informed that his/her managed care company has contracts with or will negotiate with  certain facilities, including the following:     Patient/family informed of bed offers received:   Patient chooses bed at  Physician recommends and patient chooses bed at    Patient to be transferred to  on   Patient to be transferred to facility by   The following physician request were entered in Epic:   Additional Comments: Patient agreeable to SNF search in case this is needed ar d/c.

## 2011-08-28 NOTE — Progress Notes (Signed)
PIV inserted using ultra sound; however Pt experienced extreme "burning" when heparin bolus was initiated.  Suspect vein blew.  PIV removed.  Now awaiting PICC placement.  PA aware.  Will notifiy Pharm.

## 2011-08-28 NOTE — Progress Notes (Addendum)
ANTICOAGULATION CONSULT NOTE - Follow Up Consult  Pharmacy Consult for heparin Indication: h/o multiple PEs, IVC filter placed 09/2009  Vital Signs: Temp: 98.8 F (37.1 C) (05/23 0525) Temp src: Oral (05/23 0525) BP: 127/66 mmHg (05/23 0525) Pulse Rate: 63  (05/23 0525)  Labs:  Flo Shanks 08/28/11 0555 08/27/11 0625 08/26/11 0605  HGB 8.5* 8.2* --  HCT 25.3* 24.6* 21.6*  PLT 149* 155 131*  APTT -- -- --  LABPROT -- -- --  INR -- -- --  HEPARINUNFRC -- -- --  CREATININE 2.38* 2.52* --  CKTOTAL -- -- --  CKMB -- -- --  TROPONINI -- -- --    Estimated Creatinine Clearance: 34.5 ml/min (by C-G formula based on Cr of 2.38).  Medications:  Infusions:    Assessment: 31 yof started on heparin for chest pain initially.  She has a h/o multiple PEs with IVC filter placement 09/2009.  She was not on anticoagulation due to bleeding with coumadin.  She was found to have bilateral DVTs and heparin was started but she had some thrombocytopenia and bleeding and heparin was stopped.  Heparin is to restart today.  Her HIT was negative. Previously therapeutic on 1200 units/hr heparin.   Goal of Therapy:  Heparin level 0.3-0.7 units/ml Monitor platelets by anticoagulation protocol: Yes   Plan:  1. Resume heparin at 1200units/hr 2.  Will give small bolus of 1000 units due to bleeding history. 3. Follow up heparin level in 8 hours. 4.  CBC and heparin level daily while on heparin.  Excell Seltzer Poteet 08/28/2011,1:07 PM   Addum:  Change to lovenox 125 mg sq q12 hours.

## 2011-08-28 NOTE — Progress Notes (Signed)
FL2 faxed out to area SNF's in case she needs SNF- patient agreeable to this- appears somewhat frustrated with prolonged hospitalization and LE issues. She lives alone- has good support but will need to be mobile at d/c to home, Will continue to follow for support and further d/c planning- Eduard Clos, MSW, [place]

## 2011-08-29 DIAGNOSIS — I200 Unstable angina: Secondary | ICD-10-CM

## 2011-08-29 LAB — GLUCOSE, CAPILLARY: Glucose-Capillary: 210 mg/dL — ABNORMAL HIGH (ref 70–99)

## 2011-08-29 LAB — CBC
HCT: 25.4 % — ABNORMAL LOW (ref 36.0–46.0)
Hemoglobin: 8.6 g/dL — ABNORMAL LOW (ref 12.0–15.0)
MCH: 30.9 pg (ref 26.0–34.0)
MCV: 91.4 fL (ref 78.0–100.0)
Platelets: 158 10*3/uL (ref 150–400)
RBC: 2.78 MIL/uL — ABNORMAL LOW (ref 3.87–5.11)
WBC: 6.6 10*3/uL (ref 4.0–10.5)

## 2011-08-29 LAB — HEPARIN LEVEL (UNFRACTIONATED)
Heparin Unfractionated: 0.1 IU/mL — ABNORMAL LOW (ref 0.30–0.70)
Heparin Unfractionated: 0.1 IU/mL — ABNORMAL LOW (ref 0.30–0.70)

## 2011-08-29 MED ORDER — WARFARIN SODIUM 10 MG PO TABS
10.0000 mg | ORAL_TABLET | Freq: Once | ORAL | Status: AC
  Administered 2011-08-29: 10 mg via ORAL
  Filled 2011-08-29: qty 1

## 2011-08-29 MED ORDER — INSULIN GLARGINE 100 UNIT/ML ~~LOC~~ SOLN
15.0000 [IU] | Freq: Every day | SUBCUTANEOUS | Status: DC
  Administered 2011-08-29 – 2011-09-01 (×4): 15 [IU] via SUBCUTANEOUS

## 2011-08-29 MED ORDER — HEPARIN BOLUS VIA INFUSION
1000.0000 [IU] | Freq: Once | INTRAVENOUS | Status: AC
  Administered 2011-08-29: 1000 [IU] via INTRAVENOUS
  Filled 2011-08-29: qty 1000

## 2011-08-29 MED ORDER — WARFARIN - PHARMACIST DOSING INPATIENT: Freq: Every day | Status: DC

## 2011-08-29 NOTE — Progress Notes (Signed)
Subjective: Feels much better today  Objective: Vital signs in last 24 hours: Temp:  [97.6 F (36.4 C)-99.4 F (37.4 C)] 97.6 F (36.4 C) (05/24 0542) Pulse Rate:  [54-65] 54  (05/24 0542) Resp:  [16-20] 16  (05/24 0542) BP: (113-146)/(52-70) 120/70 mmHg (05/24 0542) SpO2:  [95 %-97 %] 97 % (05/24 0542) Weight:  [134.3 kg (296 lb 1.2 oz)] 134.3 kg (296 lb 1.2 oz) (05/24 0542) Weight change: 8.3 kg (18 lb 4.8 oz) Last BM Date: 08/26/11 Intake/Output from previous day:  -67   WT: 134.3  Very different form yesterday, but more in line with previous weights. 05/23 0701 - 05/24 0700 In: 840 [P.O.:840] Out: 2450 [Urine:2450] Intake/Output this shift: Total I/O In: 240 [P.O.:240] Out: -   PE: General appearance: alert, cooperative, no distress, morbidly obese and pleasant, normal mood & affect Neck: no adenopathy, no carotid bruit, no JVD, supple, symmetrical, trachea midline and thyroid not enlarged, symmetric, no tenderness/mass/nodules Lungs: clear to auscultation bilaterally, normal percussion bilaterally and non-labored Heart: regular rate and rhythm, S1, S2 normal, no murmur, click, rub or gallop Abdomen: soft, non-tender; bowel sounds normal; no masses,  no organomegaly and morbidly obese Extremities: edema LLE 2-3+, RLE 1-2+, venous stasis dermatitis noted and significantly less tender/sensitive Pulses: 2+ and symmetric Notably less distressed today. Neuro: Grossly intact    Lab Results:  Basename 08/29/11 0655 08/28/11 0555  WBC 6.6 7.3  HGB 8.6* 8.5*  HCT 25.4* 25.3*  PLT 158 149*   BMET  Basename 08/28/11 0555 08/27/11 0625  NA 131* 131*  K 4.9 4.8  CL 96 96  CO2 21 20  GLUCOSE 176* 192*  BUN 83* 77*  CREATININE 2.38* 2.52*  CALCIUM 9.0 9.1   No results found for this basename: TROPONINI:2,CK,MB:2 in the last 72 hours  Lab Results  Component Value Date   CHOL 149 08/14/2011   HDL 43 08/14/2011   LDLCALC 76 08/14/2011   TRIG 148 08/14/2011   CHOLHDL 3.5  08/14/2011   Lab Results  Component Value Date   HGBA1C 6.4* 08/14/2011     Lab Results  Component Value Date   TSH 1.926 08/13/2011    Hepatic Function Panel  Basename 08/27/11 0625  PROT 6.7  ALBUMIN 2.6*  AST 15  ALT 17  ALKPHOS 74  BILITOT 0.3  BILIDIR --  IBILI --   No results found for this basename: CHOL in the last 72 hours No results found for this basename: PROTIME in the last 72 hours   [id number]: Dg Chest Port 1 View  08/28/2011  *RADIOLOGY REPORT*  Clinical Data: PICC placement, verify position  PORTABLE CHEST - 1 VIEW  Comparison: 08/18/2011  Findings: Right sided approach the PICC line, with catheter tip projecting over the proximal SVC.  Hypoaeration results in interstitial and vascular crowding and elevation of the hemidiaphragms, obscuring the lung bases.  Allowing for this, no interval change.  Prominent cardiac and main pulmonary artery contours. No pleural effusion or pneumothorax.  No acute osseous finding.  IMPRESSION: Interval right PICC, with tip projecting over the proximal SVC.  Original Report Authenticated By: Suanne Marker, M.D.    Medications: I have reviewed the patient's current medications.    Marland Kitchen aspirin EC  81 mg Oral Daily  . ciprofloxacin  500 mg Oral BID  . citalopram  20 mg Oral QHS  . docusate sodium  200 mg Oral Daily  . feeding supplement  237 mL Oral BID BM  . feeding supplement  30 mL Oral BID WC  . furosemide  80 mg Oral Daily  . heparin  1,000 Units Intravenous Once  . heparin  1,000 Units Intravenous Once  . heparin  1,000 Units Intravenous Once  . insulin aspart  0-9 Units Subcutaneous TID WC  . insulin glargine  9 Units Subcutaneous QHS  . levothyroxine  50 mcg Oral QAC breakfast  . omega-3 acid ethyl esters  1 g Oral BID  . pantoprazole  40 mg Oral Q1200  . polyethylene glycol  17 g Oral BID  . DISCONTD: enoxaparin (LOVENOX) injection  1 mg/kg Subcutaneous Q12H   Assessment/Plan: Patient Active Problem List   Diagnoses  . Chest pain, admitting complaint, MI r/o, Myoview low risk  . 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 Morbid obesity, (no history of sleep apnea evaluation)  . HTN (hypertension)  . HLD (hyperlipidemia)  . Anemia - due to GI bleed & chronic disease.  Marland Kitchen History of pulmonary embolism, recurrent, 3 seperate episodes.  . Presence of IVC filter, placed June 2011 after 3d PE  . Pulmonary HTN, Moderate  PA pressure 64mmHG 09/2009  . Sinus bradycardia  . Positive D dimer- 11.17, VQ low prob, LE venous doppler neg  . Diastolic dysfunction, grade 2, EF 65-70% May 2013  . LBBB (left bundle branch block)  . Anticoagulants causing adverse effect in therapeutic use, history of retinal bleeding on Coumadin  . Chronic renal insufficiency, stage IV (severe) - due to DM & HTN  . Rectal bleeding, GI w/u- rectal fissure  . Thrombocytopenia, suspected HIT  . UTI (urinary tract infection)   . Edema, possible occluded IVC filter   PLAN: On Heparin for her acute bilateral Lower ext. DVTs, also now with PICC line.    Lovenox not advised per hematology.    H/H stable currently with Heparin. Vascular did  Not recommend thrombectomy.  See their note and Hematology note.  Mild fever last pm at 99.4 afebrile this am.  On CIpro for UTI. Last dose on the 27th.    LOS: 16 days   INGOLD,LAURA R 08/29/2011, 9:57 AM  ATTENDING ATTESTATION:  I have seen and examined the patient along with Cecilie Kicks, NP.  I have reviewed the chart, notes and new data.  I agree with Laura's note.  Brief Description: Very complicated 56 y/o obese woman with Grade 2 diastolic dysfxn, Pulm HTN & h/o DVT/PE s/p IVC filter due to retinal hemorrhage leading to cessation of anticoagulation.   Was admitted for CP -that has not been an issue for some time (negative Myoveiw), Echo otherwise normal.  CP thought to be small recurrent PEs.  Now her major issue is that she has Bilateral DVTs with significant painful  LE Edema.  Her course has been complicated as she has had rectal bleeding from an anal fissue -- treated briefly with PR Diltiazem --> complicated by severe bradycardia (she is bradycardic at baseline, but has had bouts of tachycardia -- thankfully stable now).  She has baseline anemia from chronic disease as well as CKD-3.    She was seen by Vascular [facility] who feel that thrombolysis / venogram would not be in her best interest  Hematology recommends IV Heparin, so a PICC line placed yesterday to allow IV Heparin -- poor IV Access otherwise.  Unfortunately, we do not have a good endpoint as long term IV Heparin is not a good Disposition option.  PLAN:  Bialteral DVT - symptomatic : limited options; agree with  Heparin as best choice, but will need long term anticoagulation with Warfarin.  Will monitor on IV Heparin bridging & likely start warfarin prior to d/c; at least 3-6 months of warfarin would be warranted to reduce risk of recurrence.  Question for Hematology -- Warfarin not mentioned in note, would this not be preferred long term as opposed to continuous IV Heparin (from a Disposition standpoint)  Will need to monitor rectal bleeding  H/H stable  Post transfusion.    Renal function also stable with CKD-3 (and recent acute exaccerbation)  BP & HR controlled  No active signs of CHF - on PO lasix with decent output & stable renal function.  DM - increased Lantus dose today -- is on 16 Units @ home, now 15 here (to cover supplemental SSI use). Not on Metformin or Glipizide due to CKD/ARF.Marland Kitchen  UTI - continuing Abx; WBC normal despite low grade F (this could easily be secondary to DVT)  Will need to determine a good DISPO plan for her -- which seems to be Warfarin with Heparin bridge. She may well need HH vs. Short term SNF.  Will need PT/OT & CM/SW to start working.  Leonie Man, M.D., M.S. [facility] [address] [place], [place]   [id number]  [phone]  08/29/2011 10:34 AM

## 2011-08-29 NOTE — Progress Notes (Signed)
Nutrition Follow-up  S/p vascular ultrasound which showed bilateral LE DVT. PO intake variable at 0-75% per flowsheet records. Patient drinking Glucerna Shakes ordered per RD. Disposition: HH vs short-term SNF.  Diet Order:  Carbohydrate Modified Medium Calorie  Meds: Scheduled Meds:   . aspirin EC  81 mg Oral Daily  . ciprofloxacin  500 mg Oral BID  . citalopram  20 mg Oral QHS  . docusate sodium  200 mg Oral Daily  . feeding supplement  237 mL Oral BID BM  . feeding supplement  30 mL Oral BID WC  . furosemide  80 mg Oral Daily  . heparin  1,000 Units Intravenous Once  . heparin  1,000 Units Intravenous Once  . heparin  1,000 Units Intravenous Once  . insulin aspart  0-9 Units Subcutaneous TID WC  . insulin glargine  15 Units Subcutaneous QHS  . levothyroxine  50 mcg Oral QAC breakfast  . omega-3 acid ethyl esters  1 g Oral BID  . pantoprazole  40 mg Oral Q1200  . polyethylene glycol  17 g Oral BID  . DISCONTD: enoxaparin (LOVENOX) injection  1 mg/kg Subcutaneous Q12H  . DISCONTD: insulin glargine  9 Units Subcutaneous QHS   Continuous Infusions:   . heparin 1,500 Units/hr (08/29/11 [id number])  . DISCONTD: heparin    . DISCONTD: heparin Stopped (08/28/11 1730)   PRN Meds:.acetaminophen, bisacodyl, bisacodyl, diazepam, fluticasone, HYDROcodone-acetaminophen, loratadine, ondansetron (ZOFRAN) IV, sodium chloride, sodium chloride  Labs:  CMP     Component Value Date/Time   NA 131* 08/28/2011 0555   K 4.9 08/28/2011 0555   CL 96 08/28/2011 0555   CO2 21 08/28/2011 0555   GLUCOSE 176* 08/28/2011 0555   BUN 83* 08/28/2011 0555   CREATININE 2.38* 08/28/2011 0555   CREATININE 2.27* 08/24/2011 1125   CALCIUM 9.0 08/28/2011 0555   PROT 6.7 08/27/2011 0625   ALBUMIN 2.6* 08/27/2011 0625   AST 15 08/27/2011 0625   ALT 17 08/27/2011 0625   ALKPHOS 74 08/27/2011 0625   BILITOT 0.3 08/27/2011 0625   GFRNONAA 22* 08/28/2011 0555   GFRAA 25* 08/28/2011 0555     Intake/Output Summary (Last 24  hours) at 08/29/11 1050 Last data filed at 08/29/11 [id number]  Gross per 24 hour  Intake    720 ml  Output   2450 ml  Net  -1730 ml    CBG (last 3)   Basename 08/29/11 0658 08/28/11 2158 08/28/11 1603  GLUCAP 204* 209* 168*    Weight Status:  134.3 kg (5/24) -- stable  Re-estimated needs:  1600-1800 kcals, 85-95 gm protein  Nutrition Dx:  Inadequate Oral Intake, ongoing  Goal:  Oral intake with meals and supplements to meet >90% of estimated nutrition needs, unmet Monitor: PO intake, labs, weight, I/O's  Intervention:    Continue Glucerna Shake & Prostat liquid protein BID  RD to follow for nutrition care plan  Lady Deutscher Pager #:  [phone]

## 2011-08-29 NOTE — Evaluation (Signed)
Physical Therapy Evaluation Patient Details Name: [name] MRN: [id number] DOB: 01/22/1956 Today's Date: 08/29/2011 Time: [id number] PT Time Calculation (min): 26 min  PT Assessment / Plan / Recommendation Clinical Impression  Pt with CP and rectal bleed initially on admission 5/8 with development of bil LE edema and DVT on IV heparin. Pt unable to stand for the last 2 weeks due to edema and pain and thus has decreased mobility and activity tolerance currentlly. Pt will benefit from acute therapy to maximize activity, strength, transfers and gait prior to discharge to increase independence and decrease burden of care.    PT Assessment  Patient needs continued PT services    Follow Up Recommendations  Home health PT;Supervision for mobility/OOB    Barriers to Discharge None Pt states family and friends can provide 24 hr care    lEquipment Recommendations  Rolling walker with 5" wheels;3 in 1 bedside comode (wide RW)    Recommendations for Other Services OT consult   Frequency Min 3X/week    Precautions / Restrictions Precautions Precautions: Fall   Pertinent Vitals/Pain Pain 0/10 in bed static, 6/10 in standing bil foot pain      Mobility  Bed Mobility Bed Mobility: Supine to Sit Supine to Sit: 5: Supervision;HOB elevated;With rails (HOb 20 degrees) Details for Bed Mobility Assistance: momentum and increased time but pt able to pivot bil LE off EOB and elevate trunk from surface Transfers Transfers: Sit to Stand;Stand to Sit;Stand Pivot Transfers Sit to Stand: 4: Min assist;From bed;From chair/3-in-1;With armrests Stand to Sit: 4: Min guard;To chair/3-in-1 Stand Pivot Transfers: 4: Min assist Details for Transfer Assistance: Pt able to stand from bed and chair with cueing for hand placement and pivot with use of Rw and cueing for safety and sequence. Pt stood from bed and chair max 40 sec standing due to bil foot pain R>L in standing Ambulation/Gait Ambulation/Gait  Assistance: Not tested (comment) Stairs: No    Exercises General Exercises - Lower Extremity Long Arc Quad: AROM;Both;15 reps;Seated Hip Flexion/Marching: AROM;Both;15 reps;Seated   PT Diagnosis: Difficulty walking;Abnormality of gait;Acute pain  PT Problem List: Decreased range of motion;Decreased mobility;Decreased activity tolerance;Pain;Decreased knowledge of use of DME PT Treatment Interventions: Gait training;DME instruction;Functional mobility training;Therapeutic activities;Therapeutic exercise;Patient/family education   PT Goals Acute Rehab PT Goals PT Goal Formulation: With patient Time For Goal Achievement: 09/12/11 Potential to Achieve Goals: Good Pt will go Supine/Side to Sit: with modified independence;with HOB 0 degrees PT Goal: Supine/Side to Sit - Progress: Goal set today Pt will go Sit to Supine/Side: with supervision;with HOB 0 degrees PT Goal: Sit to Supine/Side - Progress: Goal set today Pt will go Sit to Stand: with supervision PT Goal: Sit to Stand - Progress: Goal set today Pt will go Stand to Sit: with supervision PT Goal: Stand to Sit - Progress: Goal set today Pt will Transfer Bed to Chair/Chair to Bed: with supervision PT Transfer Goal: Bed to Chair/Chair to Bed - Progress: Goal set today Pt will Ambulate: 51 - 150 feet;with least restrictive assistive device;with supervision PT Goal: Ambulate - Progress: Goal set today Pt will Perform Home Exercise Program: Independently PT Goal: Perform Home Exercise Program - Progress: Goal set today  Visit Information  Last PT Received On: 08/29/11 Assistance Needed: +1    Subjective Data  Subjective: I was doing fine until these legs started swelling. Patient Stated Goal: return home to my dogs   Prior Functioning  Home Living Lives With: Alone Available Help at Discharge: Family;Available 24  hours/day Type of Home: Apartment Home Access: Level entry Home Layout: One level Bathroom Shower/Tub: Scientist, forensic: Standard Home Adaptive Equipment: Straight cane Prior Function Level of Independence: Independent Able to Take Stairs?: Yes Driving: Yes Vocation: Unemployed Comments: Pt states she was doing everything until admission and now has not stood in 2wks due to bil LE pain/edema Communication Communication: No difficulties    Cognition  Overall Cognitive Status: Appears within functional limits for tasks assessed/performed Arousal/Alertness: Awake/alert Orientation Level: Appears intact for tasks assessed Behavior During Session: [facility] for tasks performed    Extremity/Trunk Assessment Right Lower Extremity Assessment RLE ROM/Strength/Tone: [facility] for tasks assessed RLE ROM/Strength/Tone Deficits: strength grossly 3/5 for hips and knees Left Lower Extremity Assessment LLE ROM/Strength/Tone: WFL for tasks assessed   Balance    End of Session PT - End of Session Equipment Utilized During Treatment: Gait belt Activity Tolerance: Patient tolerated treatment well Patient left: in chair;with call bell/phone within reach   Melford Aase 08/29/2011, 1:01 PM  [name], [place]

## 2011-08-29 NOTE — Progress Notes (Signed)
ANTICOAGULATION CONSULT NOTE - Follow Up Consult  Pharmacy Consult for heparin/Coumadin Indication: h/o multiple PEs, IVC filter placed 09/2009  Vital Signs: Temp: 97.4 F (36.3 C) (05/24 1400) Temp src: Oral (05/24 1400) BP: 152/62 mmHg (05/24 1400) Pulse Rate: 65  (05/24 1400)  Labs:  Basename 08/29/11 1556 08/29/11 0700 08/29/11 0655 08/28/11 0555 08/27/11 0625  HGB -- -- 8.6* 8.5* --  HCT -- -- 25.4* 25.3* 24.6*  PLT -- -- 158 149* 155  APTT -- -- -- -- --  LABPROT -- -- -- -- --  INR -- -- -- -- --  HEPARINUNFRC 0.31 0.10* -- -- --  CREATININE -- -- -- 2.38* 2.52*  CKTOTAL -- -- -- -- --  CKMB -- -- -- -- --  TROPONINI -- -- -- -- --    Estimated Creatinine Clearance: 35.9 ml/min (by C-G formula based on Cr of 2.38).  Medications:  Infusions:     . heparin 1,500 Units/hr (08/29/11 [id number])  . DISCONTD: heparin Stopped (08/28/11 1730)   Assessment: 44 yof started on heparin for chest pain initially.  She has a h/o multiple PEs with IVC filter placement 09/2009.  She was not on anticoagulation due to bleeding with coumadin.  She was found to have bilateral DVTs and heparin was started but she had some thrombocytopenia and bleeding and heparin was stopped.  Heparin restarted 5/23 pm.  Her HIT was negative.   Currently, heparin level at goal with heparin at 1500 units/hr.  No complications noted.  Goal of Therapy:  Heparin level 0.3-0.7 units/ml Monitor platelets by anticoagulation protocol: Yes  INR 2-3   Plan:  -Continue heparin at 1500 units/hr -f/u 2200 heparin level to confirm  Praneel Haisley L. Amada Jupiter, PharmD, [place] Clinical Pharmacist Pager: [phone] 08/29/2011 4:42 PM

## 2011-08-29 NOTE — Progress Notes (Signed)
  08/29/2011, 3:37 PM  [name] day: 17 Antibiotics: cipro day 5 Heparin day 2, coumadin day 1  Subjective: Status reviewed; appreciate help from Dr Lamonte Sakai yesterday. Heparin begun yesterday after PICC placed, with coumadin to start today; pharmacy following. Swelling in legs is improving and they are less uncomfortable. She has had no bleeding including with bowel movement today. She is feeling much better overall than on admission, other than legs. She has been able to stand on legs today, which she could not do yesterday due to pain. She cannot give precise history concerning retinal bleeds and anticoagulation, tho she recalls that first episode of bleeding was with "shots at home" likely lovenox, ~ 2006 (?). She thinks that coumadin was stopped due to excessive bleeding with finger sticks, but perhaps not because of retinal bleeds. She is followed very closely by [facility] (Dr Dolores Lory) and is due back there now. She believes that Dr Mellody Drown monitored coumadin previously.  Objective: Vital signs in last 24 hours: Blood pressure 152/62, pulse 65, temperature 97.4 F (36.3 C), temperature source Oral, resp. rate 20, height 5\' 3"  (1.6 m), weight 296 lb 1.2 oz (134.3 kg), SpO2 96.00%. Looks comfortable in recliner on RA, legs elevated. Lungs clear ant/ lat. Cor RRR. Abd soft, nontender. LLE still tight in calf and more swelling thigh than on right; right leg soft down to chronic venous stasis changes.   Intake/Output from previous day: 05/23 0701 - 05/24 0700 In: 840 [P.O.:840] Out: 2450 [Urine:2450] Intake/Output this shift: Total I/O In: 240 [P.O.:240] Out: 400 [Urine:400]   Lab Results:  [name] 08/29/11 0655 08/28/11 0555  WBC 6.6 7.3  HGB 8.6* 8.5*  HCT 25.4* 25.3*  PLT 158 149*   BMET  Basename 08/28/11 0555 08/27/11 0625  NA 131* 131*  K 4.9 4.8  CL 96 96  CO2 21 20  GLUCOSE 176* 192*  BUN 83* 77*  CREATININE 2.38* 2.52*  CALCIUM 9.0 9.1   SPEP and light chains  pending,   Studies/Results: Dg Chest Port 1 View  08/28/2011  *RADIOLOGY REPORT*  Clinical Data: PICC placement, verify position  PORTABLE CHEST - 1 VIEW  Comparison: 08/18/2011  Findings: Right sided approach the PICC line, with catheter tip projecting over the proximal SVC.  Hypoaeration results in interstitial and vascular crowding and elevation of the hemidiaphragms, obscuring the lung bases.  Allowing for this, no interval change.  Prominent cardiac and main pulmonary artery contours. No pleural effusion or pneumothorax.  No acute osseous finding.  IMPRESSION: Interval right PICC, with tip projecting over the proximal SVC.  Original Report Authenticated By: Suanne Marker, M.D.     Assessment/Plan: 1.recurrent LE DVTs in patient with IVC filter in, also morbidly obese and not very mobile. History of retinal bleeds (and diabetic retinopathy), bleeding earlier this hospitalization from rectal fissure resolved. Vascular surgery did not feel that she was appropriate for thrombectomy and renal function does not allow thrombolysis. Now on heparin qtt with improvement already, and coumadin planned. Would suggest keeping INR in LOW therapeutic range, ~ 2.0 - 2.2. She should have eye exam soon after DC.  2.multifactorial anemia: post PRBCs 5-21 3.thrombocytopenia resolved 4.myeloma screens in process per Dr Lamonte Sakai Please call over weekend if our service can help. [name] P

## 2011-08-29 NOTE — Progress Notes (Addendum)
ANTICOAGULATION CONSULT NOTE - Follow Up Consult  Pharmacy Consult for heparin/Coumadin Indication: h/o multiple PEs, IVC filter placed 09/2009  Vital Signs: Temp: 97.6 F (36.4 C) (05/24 0542) Temp src: Oral (05/24 0542) BP: 120/70 mmHg (05/24 0542) Pulse Rate: 54  (05/24 0542)  Labs:  Basename 08/29/11 0700 08/29/11 0655 08/28/11 0555 08/27/11 0625  HGB -- 8.6* 8.5* --  HCT -- 25.4* 25.3* 24.6*  PLT -- 158 149* 155  APTT -- -- -- --  LABPROT -- -- -- --  INR -- -- -- --  HEPARINUNFRC 0.10* -- -- --  CREATININE -- -- 2.38* 2.52*  CKTOTAL -- -- -- --  CKMB -- -- -- --  TROPONINI -- -- -- --    Estimated Creatinine Clearance: 35.9 ml/min (by C-G formula based on Cr of 2.38).  Medications:  Infusions:     . heparin 1,200 Units/hr (08/28/11 2327)  . DISCONTD: heparin    . DISCONTD: heparin Stopped (08/28/11 1730)   Assessment: 35 yof started on heparin for chest pain initially.  She has a h/o multiple PEs with IVC filter placement 09/2009.  She was not on anticoagulation due to bleeding with coumadin.  She was found to have bilateral DVTs and heparin was started but she had some thrombocytopenia and bleeding and heparin was stopped.  Heparin restarted 5/23 pm.  Her HIT was negative. Heparin level <0.1 units/ml. Plan to start coumadin today.  Goal of Therapy:  Heparin level 0.3-0.7 units/ml Monitor platelets by anticoagulation protocol: Yes INR 2-3   Plan:  1. Increase heparin to 1500units/hr 2.  Will give small bolus of 1000 units. 3. Follow up heparin level in 8 hours. 4.  Coumadin 10 mg po today. 5.  Check daily PT/INR  Ersie Savino Poteet 08/29/2011,7:47 AM

## 2011-08-29 NOTE — Progress Notes (Addendum)
ANTICOAGULATION CONSULT NOTE - Follow Up Consult  Pharmacy Consult for heparin/Coumadin Indication: h/o multiple PEs, IVC filter placed 09/2009  IBW: 65.5 kg Actual body wt: 134.3 kg Heparin dosing wt: 87 kg   Vital Signs: Temp: 99.2 F (37.3 C) (05/24 2050) Temp src: Oral (05/24 2050) BP: 130/78 mmHg (05/24 2050) Pulse Rate: 60  (05/24 2050)  Labs:  [name] 08/29/11 2138 08/29/11 1556 08/29/11 0700 08/29/11 0655 08/28/11 0555 08/27/11 0625  HGB -- -- -- 8.6* 8.5* --  HCT -- -- -- 25.4* 25.3* 24.6*  PLT -- -- -- 158 149* 155  APTT -- -- -- -- -- --  LABPROT -- -- -- -- -- --  INR -- -- -- -- -- --  HEPARINUNFRC 0.10* 0.31 0.10* -- -- --  CREATININE -- -- -- -- 2.38* 2.52*  CKTOTAL -- -- -- -- -- --  CKMB -- -- -- -- -- --  TROPONINI -- -- -- -- -- --    Estimated Creatinine Clearance: 35.9 ml/min (by C-G formula based on Cr of 2.38).  Medications:  Infusions:     . heparin 1,500 Units/hr (08/29/11 [id number])  . DISCONTD: heparin Stopped (08/28/11 1730)   Assessment: 63 yof started on heparin for chest pain initially.  She has a h/o multiple PEs with IVC filter placement 09/2009.  She was not on anticoagulation due to bleeding with coumadin.  She was found to have bilateral DVTs and heparin was started but she had some thrombocytopenia and bleeding and heparin was stopped.  Heparin restarted 5/23 pm.  Her HIT was negative.   Currently, repeat heparin level low with heparin at 1500 units/hr.  Discussed with RN, no complications with infusion noted.    Goal of Therapy:  Heparin level 0.3-0.7 units/ml Monitor platelets by anticoagulation protocol: Yes  INR 2-3   Plan:  -Increase heparin to 1750 units/hr -f/u 7h heparin level (with am labs)  Bryson Ha L. Amada Jupiter, PharmD, [place] Clinical Pharmacist Pager: [phone] 08/29/2011 10:13 PM

## 2011-08-30 LAB — GLUCOSE, CAPILLARY
Glucose-Capillary: 164 mg/dL — ABNORMAL HIGH (ref 70–99)
Glucose-Capillary: 242 mg/dL — ABNORMAL HIGH (ref 70–99)
Glucose-Capillary: 155 mg/dL — ABNORMAL HIGH (ref 70–99)

## 2011-08-30 LAB — CBC
MCHC: 33.2 g/dL (ref 30.0–36.0)
RDW: 14.3 % (ref 11.5–15.5)

## 2011-08-30 LAB — HEPARIN LEVEL (UNFRACTIONATED)
Heparin Unfractionated: 0.46 IU/mL (ref 0.30–0.70)
Heparin Unfractionated: 0.69 IU/mL (ref 0.30–0.70)

## 2011-08-30 LAB — BASIC METABOLIC PANEL
Calcium: 8.9 mg/dL (ref 8.4–10.5)
GFR calc non Af Amer: 30 mL/min — ABNORMAL LOW (ref >90)
Sodium: 135 mEq/L (ref 135–145)

## 2011-08-30 LAB — PROTIME-INR
INR: 1.27 (ref 0.00–1.49)
Prothrombin Time: 16.2 seconds — ABNORMAL HIGH (ref 11.6–15.2)

## 2011-08-30 MED ORDER — WARFARIN SODIUM 10 MG PO TABS
10.0000 mg | ORAL_TABLET | Freq: Once | ORAL | Status: AC
  Administered 2011-08-30: 10 mg via ORAL
  Filled 2011-08-30: qty 1

## 2011-08-30 NOTE — Progress Notes (Signed)
ANTICOAGULATION CONSULT NOTE - Follow Up Consult  Pharmacy Consult for heparin Indication: h/o mult PE  Labs:  Basename 08/30/11 0513 08/29/11 2138 08/29/11 1556 08/29/11 0655 08/28/11 0555  HGB -- -- -- 8.6* 8.5*  HCT -- -- -- 25.4* 25.3*  PLT -- -- -- 158 149*  APTT -- -- -- -- --  LABPROT 16.2* -- -- -- --  INR 1.27 -- -- -- --  HEPARINUNFRC 0.46 0.10* 0.31 -- --  CREATININE -- -- -- -- 2.38*  CKTOTAL -- -- -- -- --  CKMB -- -- -- -- --  TROPONINI -- -- -- -- --    Assessment/Plan: 56yo female now therapeutic on heparin.  Will continue gtt at current rate and confirm stable with additional level.   Rogue Bussing PharmD BCPS 08/30/2011,7:07 AM

## 2011-08-30 NOTE — Progress Notes (Signed)
ANTICOAGULATION CONSULT NOTE - Follow Up Consult  Pharmacy Consult for heparin/coumadin Indication: h/o mult PE  Labs:  Basename 08/30/11 1158 08/30/11 0513 08/29/11 2138 08/29/11 0655 08/28/11 0555  HGB -- 8.0* -- 8.6* --  HCT -- 24.1* -- 25.4* 25.3*  PLT -- 167 -- 158 149*  APTT -- -- -- -- --  LABPROT -- 16.2* -- -- --  INR -- 1.27 -- -- --  HEPARINUNFRC 0.69 0.46 0.10* -- --  CREATININE -- 1.85* -- -- 2.38*  CKTOTAL -- -- -- -- --  CKMB -- -- -- -- --  TROPONINI -- -- -- -- --    Assessment/Plan: 56yo female now therapeutic on heparin with trend up.  Will decrease heparin drip to 1700 units/hr.  Coumadin 10 mg po today.  F/u am labs.   Beverlee Nims PharmD 08/30/2011,1:28 PM

## 2011-08-30 NOTE — Progress Notes (Signed)
Subjective:  No CP/SOB  Objective:  Temp:  [97.4 F (36.3 C)-99.2 F (37.3 C)] 97.7 F (36.5 C) (05/25 0419) Pulse Rate:  [60-65] 61  (05/25 0419) Resp:  [18-20] 19  (05/25 0419) BP: (113-152)/(57-78) 113/57 mmHg (05/25 0419) SpO2:  [96 %-99 %] 97 % (05/25 0419) Weight change:   Intake/Output from previous day: 05/24 0701 - 05/25 0700 In: 1285 [P.O.:1080; I.V.:205] Out: 2150 [Urine:2150]  Intake/Output from this shift:    Physical Exam: General appearance: alert, cooperative and appears stated age Neck: no adenopathy, no carotid bruit, no JVD, supple, symmetrical, trachea midline and thyroid not enlarged, symmetric, no tenderness/mass/nodules Lungs: clear to auscultation bilaterally Heart: regular rate and rhythm, S1, S2 normal, no murmur, click, rub or gallop Extremities: 3+ pitting edema  Lab Results: Results for orders placed during the hospital encounter of 08/13/11 (from the past 48 hour(s))  GLUCOSE, CAPILLARY     Status: Abnormal   Collection Time   08/28/11 11:34 AM      Component Value Range Comment   Glucose-Capillary 184 (*) 70 - 99 (mg/dL)   GLUCOSE, CAPILLARY     Status: Abnormal   Collection Time   08/28/11  4:03 PM      Component Value Range Comment   Glucose-Capillary 168 (*) 70 - 99 (mg/dL)   GLUCOSE, CAPILLARY     Status: Abnormal   Collection Time   08/28/11  9:58 PM      Component Value Range Comment   Glucose-Capillary 209 (*) 70 - 99 (mg/dL)    Comment 1 Notify RN     CBC     Status: Abnormal   Collection Time   08/29/11  6:55 AM      Component Value Range Comment   WBC 6.6  4.0 - 10.5 (K/uL)    RBC 2.78 (*) 3.87 - 5.11 (MIL/uL)    Hemoglobin 8.6 (*) 12.0 - 15.0 (g/dL)    HCT 25.4 (*) 36.0 - 46.0 (%)    MCV 91.4  78.0 - 100.0 (fL)    MCH 30.9  26.0 - 34.0 (pg)    MCHC 33.9  30.0 - 36.0 (g/dL)    RDW 14.4  11.5 - 15.5 (%)    Platelets 158  150 - 400 (K/uL)   GLUCOSE, CAPILLARY     Status: Abnormal   Collection Time   08/29/11  6:58 AM     Component Value Range Comment   Glucose-Capillary 204 (*) 70 - 99 (mg/dL)   HEPARIN LEVEL (UNFRACTIONATED)     Status: Abnormal   Collection Time   08/29/11  7:00 AM      Component Value Range Comment   Heparin Unfractionated 0.10 (*) 0.30 - 0.70 (IU/mL)   GLUCOSE, CAPILLARY     Status: Abnormal   Collection Time   08/29/11 11:34 AM      Component Value Range Comment   Glucose-Capillary 200 (*) 70 - 99 (mg/dL)    Comment 1 Notify RN     HEPARIN LEVEL (UNFRACTIONATED)     Status: Normal   Collection Time   08/29/11  3:56 PM      Component Value Range Comment   Heparin Unfractionated 0.31  0.30 - 0.70 (IU/mL)   GLUCOSE, CAPILLARY     Status: Abnormal   Collection Time   08/29/11  4:26 PM      Component Value Range Comment   Glucose-Capillary 210 (*) 70 - 99 (mg/dL)    Comment 1 Documented in Chart  Comment 2 Notify RN     GLUCOSE, CAPILLARY     Status: Abnormal   Collection Time   08/29/11  8:47 PM      Component Value Range Comment   Glucose-Capillary 217 (*) 70 - 99 (mg/dL)    Comment 1 Documented in Chart      Comment 2 Notify RN     HEPARIN LEVEL (UNFRACTIONATED)     Status: Abnormal   Collection Time   08/29/11  9:38 PM      Component Value Range Comment   Heparin Unfractionated 0.10 (*) 0.30 - 0.70 (IU/mL)   CBC     Status: Abnormal   Collection Time   08/30/11  5:13 AM      Component Value Range Comment   WBC 7.0  4.0 - 10.5 (K/uL)    RBC 2.62 (*) 3.87 - 5.11 (MIL/uL)    Hemoglobin 8.0 (*) 12.0 - 15.0 (g/dL)    HCT 24.1 (*) 36.0 - 46.0 (%)    MCV 92.0  78.0 - 100.0 (fL)    MCH 30.5  26.0 - 34.0 (pg)    MCHC 33.2  30.0 - 36.0 (g/dL)    RDW 14.3  11.5 - 15.5 (%)    Platelets 167  150 - 400 (K/uL)   HEPARIN LEVEL (UNFRACTIONATED)     Status: Normal   Collection Time   08/30/11  5:13 AM      Component Value Range Comment   Heparin Unfractionated 0.46  0.30 - 0.70 (IU/mL)   BASIC METABOLIC PANEL     Status: Abnormal   Collection Time   08/30/11  5:13 AM       Component Value Range Comment   Sodium 135  135 - 145 (mEq/L)    Potassium 4.5  3.5 - 5.1 (mEq/L)    Chloride 101  96 - 112 (mEq/L)    CO2 23  19 - 32 (mEq/L)    Glucose, Bld 174 (*) 70 - 99 (mg/dL)    BUN 76 (*) 6 - 23 (mg/dL)    Creatinine, Ser 1.85 (*) 0.50 - 1.10 (mg/dL)    Calcium 8.9  8.4 - 10.5 (mg/dL)    GFR calc non Af Amer 30 (*) >90 (mL/min)    GFR calc Af Amer 34 (*) >90 (mL/min)   PROTIME-INR     Status: Abnormal   Collection Time   08/30/11  5:13 AM      Component Value Range Comment   Prothrombin Time 16.2 (*) 11.6 - 15.2 (seconds)    INR 1.27  0.00 - 1.49    GLUCOSE, CAPILLARY     Status: Abnormal   Collection Time   08/30/11  6:05 AM      Component Value Range Comment   Glucose-Capillary 164 (*) 70 - 99 (mg/dL)    Comment 1 Documented in Chart      Comment 2 Notify RN       Imaging: Imaging results have been reviewed  Assessment/Plan:   1. Principal Problem: 2.  *Chest pain, admitting complaint, MI r/o, Myoview low risk 3. Active Problems: 4.  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  Morbid obesity, (no history of sleep apnea evaluation) 6.  HTN (hypertension) 7.  HLD (hyperlipidemia) 8.  Anemia - due to GI bleed & chronic disease. 9.  History of pulmonary embolism, recurrent, 3 seperate episodes. 10.  Presence of IVC filter, placed June 2011 after 3d PE 11.  Pulmonary HTN, Moderate  PA pressure 70mmHG  09/2009 12.  Sinus bradycardia 13.  Positive D dimer- 11.17, VQ low prob, LE venous doppler neg 14.  Diastolic dysfunction, grade 2, EF 65-70% May 2013 15.  LBBB (left bundle branch block) 16.  Anticoagulants causing adverse effect in therapeutic use, history of retinal bleeding on Coumadin 17.  Chronic renal insufficiency, stage IV (severe) - due to DM & HTN 18.  Rectal bleeding, GI w/u- rectal fissure 19.  Thrombocytopenia, suspected HIT 20.  UTI (urinary tract infection)  21.  Edema, possible occluded IVC filter 22.   Time Spent  Directly with Patient:  20 minutes  Length of Stay:  LOS: 17 days   PICC line placed. On IV hep to coumadin A/C for bilateral LE DVT with possibility of thrombosed IVC filter. No complaints other then painful feet secondary to edema. I/O neg 3 liters. Scr decreasing. VSS. Exam otherwise benign. PT/OT. Prob home mid to end of next week after INR therapeutic and edema improves. Pt has a cousin that can provide in home 24 hour skilled care and does not wish SNF.   Lorretta Harp 08/30/2011, 8:27 AM

## 2011-08-30 NOTE — Progress Notes (Signed)
Pt up to chair x1 today x 2 assist with walker. Pt tolerated well  Will monitor patient. Kailah Pennel, Bettina Gavia RN

## 2011-08-31 DIAGNOSIS — E119 Type 2 diabetes mellitus without complications: Secondary | ICD-10-CM

## 2011-08-31 DIAGNOSIS — R809 Proteinuria, unspecified: Secondary | ICD-10-CM

## 2011-08-31 LAB — HEPARIN LEVEL (UNFRACTIONATED): Heparin Unfractionated: 0.66 IU/mL (ref 0.30–0.70)

## 2011-08-31 LAB — CBC
MCH: 29.9 pg (ref 26.0–34.0)
Platelets: 181 10*3/uL (ref 150–400)
RBC: 2.88 MIL/uL — ABNORMAL LOW (ref 3.87–5.11)
WBC: 7.2 10*3/uL (ref 4.0–10.5)

## 2011-08-31 LAB — GLUCOSE, CAPILLARY: Glucose-Capillary: 174 mg/dL — ABNORMAL HIGH (ref 70–99)

## 2011-08-31 MED ORDER — LEVOTHYROXINE SODIUM 50 MCG PO TABS
50.0000 ug | ORAL_TABLET | Freq: Once | ORAL | Status: AC
  Administered 2011-08-31: 50 ug via ORAL
  Filled 2011-08-31: qty 1

## 2011-08-31 MED ORDER — WARFARIN SODIUM 10 MG PO TABS
10.0000 mg | ORAL_TABLET | Freq: Once | ORAL | Status: AC
  Administered 2011-08-31: 10 mg via ORAL
  Filled 2011-08-31: qty 1

## 2011-08-31 NOTE — Progress Notes (Signed)
Pt up to chair, x1 today. Pt tolerated well will monitor pt. Marji Kuehnel, Bettina Gavia RN

## 2011-08-31 NOTE — Progress Notes (Signed)
Subjective:  Clinically improved. No CP/SOB  Objective:  Temp:  [97.5 F (36.4 C)-98.2 F (36.8 C)] 97.5 F (36.4 C) (05/26 0515) Pulse Rate:  [52-66] 52  (05/26 0515) Resp:  [18-19] 19  (05/26 0515) BP: (125-143)/(60-73) 137/60 mmHg (05/26 0515) SpO2:  [98 %-99 %] 98 % (05/26 0515) Weight change:   Intake/Output from previous day: 05/25 0701 - 05/26 0700 In: 959.3 [P.O.:840; I.V.:119.3] Out: 900 [Urine:900]  Intake/Output from this shift: Total I/O In: 240 [P.O.:240] Out: 1000 [Urine:1000]  Physical Exam: General appearance: alert and cooperative Neck: no adenopathy, no carotid bruit, no JVD, supple, symmetrical, trachea midline and thyroid not enlarged, symmetric, no tenderness/mass/nodules Lungs: clear to auscultation bilaterally Heart: soft outflow murmur Extremities: 1-2 + BLLE edema  Lab Results: Results for orders placed during the hospital encounter of 08/13/11 (from the past 48 hour(s))  GLUCOSE, CAPILLARY     Status: Abnormal   Collection Time   08/29/11 11:34 AM      Component Value Range Comment   Glucose-Capillary 200 (*) 70 - 99 (mg/dL)    Comment 1 Notify RN     HEPARIN LEVEL (UNFRACTIONATED)     Status: Normal   Collection Time   08/29/11  3:56 PM      Component Value Range Comment   Heparin Unfractionated 0.31  0.30 - 0.70 (IU/mL)   GLUCOSE, CAPILLARY     Status: Abnormal   Collection Time   08/29/11  4:26 PM      Component Value Range Comment   Glucose-Capillary 210 (*) 70 - 99 (mg/dL)    Comment 1 Documented in Chart      Comment 2 Notify RN     GLUCOSE, CAPILLARY     Status: Abnormal   Collection Time   08/29/11  8:47 PM      Component Value Range Comment   Glucose-Capillary 217 (*) 70 - 99 (mg/dL)    Comment 1 Documented in Chart      Comment 2 Notify RN     HEPARIN LEVEL (UNFRACTIONATED)     Status: Abnormal   Collection Time   08/29/11  9:38 PM      Component Value Range Comment   Heparin Unfractionated 0.10 (*) 0.30 - 0.70 (IU/mL)     CBC     Status: Abnormal   Collection Time   08/30/11  5:13 AM      Component Value Range Comment   WBC 7.0  4.0 - 10.5 (K/uL)    RBC 2.62 (*) 3.87 - 5.11 (MIL/uL)    Hemoglobin 8.0 (*) 12.0 - 15.0 (g/dL)    HCT 24.1 (*) 36.0 - 46.0 (%)    MCV 92.0  78.0 - 100.0 (fL)    MCH 30.5  26.0 - 34.0 (pg)    MCHC 33.2  30.0 - 36.0 (g/dL)    RDW 14.3  11.5 - 15.5 (%)    Platelets 167  150 - 400 (K/uL)   HEPARIN LEVEL (UNFRACTIONATED)     Status: Normal   Collection Time   08/30/11  5:13 AM      Component Value Range Comment   Heparin Unfractionated 0.46  0.30 - 0.70 (IU/mL)   BASIC METABOLIC PANEL     Status: Abnormal   Collection Time   08/30/11  5:13 AM      Component Value Range Comment   Sodium 135  135 - 145 (mEq/L)    Potassium 4.5  3.5 - 5.1 (mEq/L)    Chloride 101  96 -  112 (mEq/L)    CO2 23  19 - 32 (mEq/L)    Glucose, Bld 174 (*) 70 - 99 (mg/dL)    BUN 76 (*) 6 - 23 (mg/dL)    Creatinine, Ser 1.85 (*) 0.50 - 1.10 (mg/dL)    Calcium 8.9  8.4 - 10.5 (mg/dL)    GFR calc non Af Amer 30 (*) >90 (mL/min)    GFR calc Af Amer 34 (*) >90 (mL/min)   PROTIME-INR     Status: Abnormal   Collection Time   08/30/11  5:13 AM      Component Value Range Comment   Prothrombin Time 16.2 (*) 11.6 - 15.2 (seconds)    INR 1.27  0.00 - 1.49    GLUCOSE, CAPILLARY     Status: Abnormal   Collection Time   08/30/11  6:05 AM      Component Value Range Comment   Glucose-Capillary 164 (*) 70 - 99 (mg/dL)    Comment 1 Documented in Chart      Comment 2 Notify RN     GLUCOSE, CAPILLARY     Status: Abnormal   Collection Time   08/30/11 11:25 AM      Component Value Range Comment   Glucose-Capillary 242 (*) 70 - 99 (mg/dL)   HEPARIN LEVEL (UNFRACTIONATED)     Status: Normal   Collection Time   08/30/11 11:58 AM      Component Value Range Comment   Heparin Unfractionated 0.69  0.30 - 0.70 (IU/mL)   GLUCOSE, CAPILLARY     Status: Abnormal   Collection Time   08/30/11  4:17 PM      Component Value  Range Comment   Glucose-Capillary 158 (*) 70 - 99 (mg/dL)   GLUCOSE, CAPILLARY     Status: Abnormal   Collection Time   08/30/11  8:57 PM      Component Value Range Comment   Glucose-Capillary 155 (*) 70 - 99 (mg/dL)    Comment 1 Documented in Chart      Comment 2 Notify RN     CBC     Status: Abnormal   Collection Time   08/31/11  5:00 AM      Component Value Range Comment   WBC 7.2  4.0 - 10.5 (K/uL)    RBC 2.88 (*) 3.87 - 5.11 (MIL/uL)    Hemoglobin 8.6 (*) 12.0 - 15.0 (g/dL)    HCT 26.7 (*) 36.0 - 46.0 (%)    MCV 92.7  78.0 - 100.0 (fL)    MCH 29.9  26.0 - 34.0 (pg)    MCHC 32.2  30.0 - 36.0 (g/dL)    RDW 14.6  11.5 - 15.5 (%)    Platelets 181  150 - 400 (K/uL)   HEPARIN LEVEL (UNFRACTIONATED)     Status: Normal   Collection Time   08/31/11  5:00 AM      Component Value Range Comment   Heparin Unfractionated 0.66  0.30 - 0.70 (IU/mL)   PROTIME-INR     Status: Abnormal   Collection Time   08/31/11  5:00 AM      Component Value Range Comment   Prothrombin Time 16.9 (*) 11.6 - 15.2 (seconds)    INR 1.35  0.00 - 1.49    GLUCOSE, CAPILLARY     Status: Abnormal   Collection Time   08/31/11  6:26 AM      Component Value Range Comment   Glucose-Capillary 135 (*) 70 - 99 (mg/dL)  Comment 1 Notify RN       Imaging: Imaging results have been reviewed  Assessment/Plan:   1. Principal Problem: 2.  *Chest pain, admitting complaint, MI r/o, Myoview low risk 3. Active Problems: 4.  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  Morbid obesity, (no history of sleep apnea evaluation) 6.  HTN (hypertension) 7.  HLD (hyperlipidemia) 8.  Anemia - due to GI bleed & chronic disease. 9.  History of pulmonary embolism, recurrent, 3 seperate episodes. 10.  Presence of IVC filter, placed June 2011 after 3d PE 11.  Pulmonary HTN, Moderate  PA pressure 4mmHG 09/2009 12.  Sinus bradycardia 13.  Positive D dimer- 11.17, VQ low prob, LE venous doppler neg 14.  Diastolic  dysfunction, grade 2, EF 65-70% May 2013 15.  LBBB (left bundle branch block) 16.  Anticoagulants causing adverse effect in therapeutic use, history of retinal bleeding on Coumadin 17.  Chronic renal insufficiency, stage IV (severe) - due to DM & HTN 18.  Rectal bleeding, GI w/u- rectal fissure 19.  Thrombocytopenia, suspected HIT 20.  UTI (urinary tract infection)  21.  Edema, possible occluded IVC filter 22.   Time Spent Directly with Patient:  20 minutes  Length of Stay:  LOS: 18 days   Looks much better. !/) neg. On IV hep and coumadin A/C per pharm for BLLE DVT. Edema improved. BNP decreasing. Scr improving. Needs PT/OT. Home when INR theraputic.  Lorretta Harp 08/31/2011, 9:18 AM

## 2011-08-31 NOTE — Progress Notes (Signed)
  08/31/2011, 12:48 PM  Hospital day: 9 Antibiotics: cipro Heparin day 4, coumadin day 3    Subjective: Feeling much better overall. Swelling in LE improving gradually. Tolerating standing and walking bed to [facility] to chair. No bleeding. Breathing comfortable, no other pain. No visual changes. Objective: Vital signs in last 24 hours: Blood pressure 137/60, pulse 52, temperature 97.5 F (36.4 C), temperature source Oral, resp. rate 19, height 5\' 3"  (1.6 m), weight 296 lb 1.2 oz (134.3 kg), SpO2 98.00%. Still significant swelling LE L>R, but no longer tight. Respirations not labored RA.  Intake/Output from previous day: 05/25 0701 - 05/26 0700 In: 959.3 [P.O.:840; I.V.:119.3] Out: 900 [Urine:900] Intake/Output this shift: Total I/O In: 240 [P.O.:240] Out: 1000 [Urine:1000]    Lab Results:  Basename 08/31/11 0500 08/30/11 0513  WBC 7.2 7.0  HGB 8.6* 8.0*  HCT 26.7* 24.1*  PLT 181 167   BMET  Basename 08/30/11 0513  NA 135  K 4.5  CL 101  CO2 23  GLUCOSE 174*  BUN 76*  CREATININE 1.85*  CALCIUM 8.9   INR 1.35 SPEP and light chains pending Studies/Results: No results found.   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 Hgb improved, follow 3.DVTs below IVC filter, symptomatic: transitioning heparin to coumadin per pharmacy. Will need close follow up of coumadin, which she may want done at [facility] or other. 3.hx retinal bleeds with previous anticoagulation. Due back to ophthalmology, which she will set up after DC. 4.morbid obesity 5.sinus bradycardia, HTN, LBBB 6.Diabetes 7. Proteinuria: labs pending above, may be related to diabetes/ renal disease  Please call if I can be of help prior to my next rounds. Kramer Hanrahan P

## 2011-08-31 NOTE — Progress Notes (Signed)
ANTICOAGULATION CONSULT NOTE - Follow Up Consult  Pharmacy Consult for heparin/coumadin Indication: h/o mult PE  Labs:  Basename 08/31/11 0500 08/30/11 1158 08/30/11 0513 08/29/11 0655  HGB 8.6* -- 8.0* --  HCT 26.7* -- 24.1* 25.4*  PLT 181 -- 167 158  APTT -- -- -- --  LABPROT 16.9* -- 16.2* --  INR 1.35 -- 1.27 --  HEPARINUNFRC 0.66 0.69 0.46 --  CREATININE -- -- 1.85* --  CKTOTAL -- -- -- --  CKMB -- -- -- --  TROPONINI -- -- -- --    Assessment/Plan: 56yo female therapeutic on heparin.  Will continue heparin drip at 1700 units/hr.  Coumadin 10 mg po again today. Day 3/5 overlap)  F/u am labs.   Beverlee Nims PharmD 08/31/2011,11:21 AM

## 2011-09-01 LAB — PROTIME-INR: INR: 1.38 (ref 0.00–1.49)

## 2011-09-01 LAB — CBC
HCT: 26.2 % — ABNORMAL LOW (ref 36.0–46.0)
Hemoglobin: 8.5 g/dL — ABNORMAL LOW (ref 12.0–15.0)
MCH: 30 pg (ref 26.0–34.0)
MCV: 92.6 fL (ref 78.0–100.0)
Platelets: 186 10*3/uL (ref 150–400)
RBC: 2.83 MIL/uL — ABNORMAL LOW (ref 3.87–5.11)
WBC: 7.2 10*3/uL (ref 4.0–10.5)

## 2011-09-01 LAB — GLUCOSE, CAPILLARY
Glucose-Capillary: 168 mg/dL — ABNORMAL HIGH (ref 70–99)
Glucose-Capillary: 193 mg/dL — ABNORMAL HIGH (ref 70–99)
Glucose-Capillary: 198 mg/dL — ABNORMAL HIGH (ref 70–99)

## 2011-09-01 LAB — BASIC METABOLIC PANEL
CO2: 23 mEq/L (ref 19–32)
Calcium: 9.1 mg/dL (ref 8.4–10.5)
GFR calc non Af Amer: 30 mL/min — ABNORMAL LOW (ref >90)
Glucose, Bld: 154 mg/dL — ABNORMAL HIGH (ref 70–99)
Potassium: 4.5 mEq/L (ref 3.5–5.1)
Sodium: 138 mEq/L (ref 135–145)

## 2011-09-01 MED ORDER — METOLAZONE 2.5 MG PO TABS
2.5000 mg | ORAL_TABLET | Freq: Every day | ORAL | Status: AC
  Administered 2011-09-01 – 2011-09-02 (×2): 2.5 mg via ORAL
  Filled 2011-09-01 (×2): qty 1

## 2011-09-01 MED ORDER — WARFARIN SODIUM 7.5 MG PO TABS
15.0000 mg | ORAL_TABLET | Freq: Once | ORAL | Status: AC
  Administered 2011-09-01: 15 mg via ORAL
  Filled 2011-09-01: qty 2

## 2011-09-01 NOTE — Progress Notes (Signed)
Subjective:  Better today, up in room.  Objective:  Vital Signs in the last 24 hours: Temp:  [97.8 F (36.6 C)-99.1 F (37.3 C)] 97.8 F (36.6 C) (05/27 0436) Pulse Rate:  [59-62] 59  (05/27 0436) Resp:  [17-18] 17  (05/27 0436) BP: (110-137)/(44-76) 137/76 mmHg (05/27 0436) SpO2:  [95 %-98 %] 97 % (05/27 0436)  Intake/Output from previous day:  Intake/Output Summary (Last 24 hours) at 09/01/11 0842 Last data filed at 08/31/11 1700  Gross per 24 hour  Intake 895.08 ml  Output      0 ml  Net 895.08 ml    Physical Exam: General appearance: alert, cooperative, no distress and morbidly obese Lungs: clear to auscultation bilaterally Heart: regular rate and rhythm and 2/6 systolic murmur LE edema persists. She has some weeping noted.   Rate: 80  Rhythm: normal sinus rhythm  Lab Results:  Basename 09/01/11 0450 08/31/11 0500  WBC 7.2 7.2  HGB 8.5* 8.6*  PLT 186 181    Basename 09/01/11 0450 08/30/11 0513  NA 138 135  K 4.5 4.5  CL 104 101  CO2 23 23  GLUCOSE 154* 174*  BUN 65* 76*  CREATININE 1.82* 1.85*   No results found for this basename: TROPONINI:2,CK,MB:2 in the last 72 hours Hepatic Function Panel No results found for this basename: PROT,ALBUMIN,AST,ALT,ALKPHOS,BILITOT,BILIDIR,IBILI in the last 72 hours No results found for this basename: CHOL in the last 72 hours  Basename 09/01/11 0450  INR 1.38    Imaging: Imaging results have been reviewed  Cardiac Studies:  Assessment/Plan:   Principal Problem:  *Chest pain, admitting complaint, MI r/o, Myoview low risk Active Problems:  Anemia - due to GI bleed & chronic disease.  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  Positive D dimer- 11.17, VQ low prob, LE venous doppler neg  Diastolic dysfunction, grade 2, EF 65-70% May 2013  Anticoagulants causing adverse effect in therapeutic use,  history of retinal bleeding on Coumadin  Chronic renal insufficiency, stage IV (severe) - due to DM & HTN  Thrombocytopenia, suspected HIT  Morbid obesity, (no history of sleep apnea evaluation)  HTN (hypertension)  HLD (hyperlipidemia)  Presence of IVC filter, placed June 2011 after 3d PE  Pulmonary HTN, Moderate  PA pressure 41mmHG 09/2009  Sinus bradycardia  LBBB (left bundle branch block)  Rectal bleeding, GI w/u- rectal fissure  UTI (urinary tract infection)    Plan- will try and resume ACE wrap to LE. Continue heparin/coumadin. No significant diuresis last 24 hrs.   Kerin Ransom PA-C 09/01/2011, 8:42 AM    Agree with note written by Kerin Ransom [facility]  Clinically improving. Still has signif. BLLE. Will add zaroxolyn. Wrap legs. IV hep to coumadin A/C for DVT. PT/OT. Prob home mid week when INR therapeutic.  Lorretta Harp 09/01/2011 9:17 AM

## 2011-09-01 NOTE — Progress Notes (Signed)
Per report, patient is now wanting to consider d/c to home at time of d/c and not to pursue SNF- patient is Medicaid pending and has been faxed out to area SNF's however no offers as of yet. CSW to continue to follow in case SNF is needed- Eduard Clos, MSW, [place]

## 2011-09-01 NOTE — Progress Notes (Signed)
Physical Therapy Treatment Patient Details Name: [name] MRN: [id number] DOB: Jul 17, 1955 Today's Date: 09/01/2011 Time: [id number] PT Time Calculation (min): 21 min  PT Assessment / Plan / Recommendation Comments on Treatment Session  Pt with bil LE edema progressing with mobility able to ambulate today and states distance to enter home is grossly 200'. Pt denied HEP as she states she has been performing ankle pumps, long arc quads, and marching throughout the day bil LE. Pt encouraged to begin ambulating to bathroom with staff and continue HEP and OOB for meals. Pt very appreciative of assist and progress. Did assist pt with donning shoes for ambulation today which seemed to help.    Follow Up Recommendations       Barriers to Discharge        Equipment Recommendations  Rolling walker with 5" wheels;Tub/shower bench (wide RW)    Recommendations for Other Services    Frequency     Plan Discharge plan remains appropriate    Precautions / Restrictions Precautions Precautions: Fall Restrictions Weight Bearing Restrictions: No   Pertinent Vitals/Pain No pain    Mobility  Bed Mobility Bed Mobility: Not assessed Transfers Transfers: Sit to Stand;Stand to Sit Sit to Stand: From chair/3-in-1;With armrests;4: Min assist Stand to Sit: To chair/3-in-1;With armrests;4: Min guard Details for Transfer Assistance: cueing for hand placement, safety and use of armrests x 2 trials with assist for anterior translation Ambulation/Gait Ambulation/Gait Assistance: 4: Min guard Ambulation Distance (Feet): 25 Feet (x2 trials) Assistive device: Rolling walker Ambulation/Gait Assistance Details: cueing to step into RW for safety Gait Pattern: Step-through pattern;Decreased stride length;Trunk flexed Gait velocity: decreased    Exercises     PT Diagnosis:    PT Problem List:   PT Treatment Interventions:     PT Goals Acute Rehab PT Goals PT Goal: Sit to Stand - Progress: Progressing  toward goal PT Goal: Stand to Sit - Progress: Progressing toward goal PT Transfer Goal: Bed to Chair/Chair to Bed - Progress: Progressing toward goal PT Goal: Ambulate - Progress: Progressing toward goal PT Goal: Perform Home Exercise Program - Progress: Progressing toward goal  Visit Information  Last PT Received On: 09/01/11 Assistance Needed: +1    Subjective Data  Subjective: I have been moving my legs and sitting up like you told me to   Cognition  Overall Cognitive Status: Appears within functional limits for tasks assessed/performed Arousal/Alertness: Awake/alert Orientation Level: Appears intact for tasks assessed Behavior During Session: [facility] for tasks performed    Balance     End of Session PT - End of Session Equipment Utilized During Treatment: Gait belt Activity Tolerance: Patient tolerated treatment well Patient left: in chair;with call bell/phone within reach Nurse Communication: Mobility status    Melford Aase 09/01/2011, 11:20 AM [name], [place]

## 2011-09-01 NOTE — Progress Notes (Signed)
ANTICOAGULATION CONSULT NOTE - Follow Up Consult  Pharmacy Consult:  Heparin / Coumadin Indication: h/o multiple PEs, IVC filter placed 09/2009  Patient Parameters: IBW: 65.5 kg Actual body wt: 134.3 kg Heparin dosing wt: 87 kg   Vital Signs: Temp: 97.8 F (36.6 C) (05/27 0436) Temp src: Oral (05/27 0436) BP: 137/76 mmHg (05/27 0436) Pulse Rate: 59  (05/27 0436)  Labs:  Basename 09/01/11 0450 08/31/11 0500 08/30/11 1158 08/30/11 0513  HGB 8.5* 8.6* -- --  HCT 26.2* 26.7* -- 24.1*  PLT 186 181 -- 167  APTT -- -- -- --  LABPROT 17.2* 16.9* -- 16.2*  INR 1.38 1.35 -- 1.27  HEPARINUNFRC 0.44 0.66 0.69 --  CREATININE 1.82* -- -- 1.85*  CKTOTAL -- -- -- --  CKMB -- -- -- --  TROPONINI -- -- -- --    Estimated Creatinine Clearance: 47 ml/min (by C-G formula based on Cr of 1.82).     Assessment: 68 YOF with h/o multiple PE's s/p IVC filter placement 09/2009.  She was started on heparin gtt for chest pain initially.  Her heparin and Coumadin therapies were interrupted d/t bleeding and thrombocytopenia, but are now resumed for B/L DVTs.  Heparin therapeutic and INR remains less than goal, no bleeding documented.    Goal of Therapy:  Heparin level 0.3-0.7 units/ml Monitor platelets by anticoagulation protocol: Yes INR 2 - 2.2 per Dr. Marko Plume    Plan:  - Continue heparin gtt at 1700 units/hr - Coumadin 15mg  PO today - Daily HL / CBC / PT / INR - MD, consider resuming glimepiride for better glycemic control     [name] D. Mina Marble, PharmD, BCPS Pager:  [phone] 09/01/2011, 1:33 PM

## 2011-09-02 LAB — PROTIME-INR
INR: 1.67 — ABNORMAL HIGH (ref 0.00–1.49)
Prothrombin Time: 20 seconds — ABNORMAL HIGH (ref 11.6–15.2)

## 2011-09-02 LAB — BASIC METABOLIC PANEL
BUN: 60 mg/dL — ABNORMAL HIGH (ref 6–23)
CO2: 23 mEq/L (ref 19–32)
Calcium: 9.3 mg/dL (ref 8.4–10.5)
Chloride: 102 mEq/L (ref 96–112)
Creatinine, Ser: 1.69 mg/dL — ABNORMAL HIGH (ref 0.50–1.10)
GFR calc Af Amer: 38 mL/min — ABNORMAL LOW (ref >90)
GFR calc non Af Amer: 33 mL/min — ABNORMAL LOW (ref >90)
Glucose, Bld: 171 mg/dL — ABNORMAL HIGH (ref 70–99)
Potassium: 4.3 mEq/L (ref 3.5–5.1)
Sodium: 137 mEq/L (ref 135–145)

## 2011-09-02 LAB — CBC
HCT: 26.1 % — ABNORMAL LOW (ref 36.0–46.0)
Hemoglobin: 8.5 g/dL — ABNORMAL LOW (ref 12.0–15.0)
MCV: 92.2 fL (ref 78.0–100.0)
WBC: 7.2 10*3/uL (ref 4.0–10.5)

## 2011-09-02 LAB — KAPPA/LAMBDA LIGHT CHAINS: Kappa free light chain: 4.63 mg/dL — ABNORMAL HIGH (ref 0.33–1.94)

## 2011-09-02 LAB — HEPARIN LEVEL (UNFRACTIONATED): Heparin Unfractionated: 0.99 IU/mL — ABNORMAL HIGH (ref 0.30–0.70)

## 2011-09-02 LAB — GLUCOSE, CAPILLARY: Glucose-Capillary: 167 mg/dL — ABNORMAL HIGH (ref 70–99)

## 2011-09-02 MED ORDER — WARFARIN SODIUM 2.5 MG PO TABS
12.5000 mg | ORAL_TABLET | Freq: Once | ORAL | Status: AC
  Administered 2011-09-02: 12.5 mg via ORAL
  Filled 2011-09-02: qty 1

## 2011-09-02 MED ORDER — INSULIN GLARGINE 100 UNIT/ML ~~LOC~~ SOLN
18.0000 [IU] | Freq: Every day | SUBCUTANEOUS | Status: DC
  Administered 2011-09-02 – 2011-09-03 (×2): 18 [IU] via SUBCUTANEOUS

## 2011-09-02 MED ORDER — COUMADIN BOOK
Freq: Once | Status: DC
  Filled 2011-09-02: qty 1

## 2011-09-02 MED ORDER — HEPARIN (PORCINE) IN NACL 100-0.45 UNIT/ML-% IJ SOLN
1450.0000 [IU]/h | INTRAMUSCULAR | Status: DC
  Administered 2011-09-02: 1400 [IU]/h via INTRAVENOUS
  Administered 2011-09-03: 1450 [IU]/h via INTRAVENOUS
  Filled 2011-09-02 (×4): qty 250

## 2011-09-02 NOTE — Progress Notes (Signed)
ANTICOAGULATION CONSULT NOTE - Follow Up Consult  Pharmacy Consult for heparin Indication: h/o PE/IVC  Labs:  Basename 09/02/11 0615 09/01/11 0450 08/31/11 0500  HGB 8.5* 8.5* --  HCT 26.1* 26.2* 26.7*  PLT 186 186 181  APTT -- -- --  LABPROT 20.0* 17.2* 16.9*  INR 1.67* 1.38 1.35  HEPARINUNFRC 1.00* 0.44 0.66  CREATININE -- 1.82* --  CKTOTAL -- -- --  CKMB -- -- --  TROPONINI -- -- --    Assessment: 56yo female now supratherapeutic on heparin after maintaining stable levels at current rate; per RN lab was drawn properly.  Goal of Therapy:  Heparin level 0.3-0.7 units/ml   Plan:  Will decrease heparin gtt to 1500 units/hr and check level in 6hr.  Rogue Bussing PharmD BCPS 09/02/2011,7:07 AM

## 2011-09-02 NOTE — Progress Notes (Addendum)
Subjective:  Better today, up in room. Walked yesterday with sneakers on. Legs less sensitive.  Objective:  Vital Signs in the last 24 hours: Temp:  [97.5 F (36.4 C)-98.2 F (36.8 C)] 98.2 F (36.8 C) (05/28 0604) Pulse Rate:  [55-63] 55  (05/28 0604) Resp:  [18] 18  (05/28 0604) BP: (127-149)/(61-69) 131/66 mmHg (05/28 0604) SpO2:  [97 %-100 %] 99 % (05/28 0604) Weight:  [134.673 kg (296 lb 14.4 oz)] 134.673 kg (296 lb 14.4 oz) (05/28 0604)  Intake/Output from previous day:  Intake/Output Summary (Last 24 hours) at 09/02/11 0900 Last data filed at 09/02/11 0607  Gross per 24 hour  Intake    480 ml  Output   3050 ml  Net  -2570 ml    Physical Exam: much more pleasant demeanor; A&O x 3 General appearance: alert, cooperative, no distress and morbidly obese Lungs: clear to auscultation bilaterally; non-labored. Heart: regular rate and rhythm and 2/6 systolic murmur LE edema persists. She has some weeping noted. 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  History of pulmonary embolism, recurrent, 3 seperate episodes.  Presence of IVC filter, placed June 2011 after 3d PE  Pulmonary HTN, Moderate  PA pressure 71mmHG 09/2009  Anticoagulants causing adverse effect in therapeutic use, history of retinal bleeding on Coumadin  Chronic  renal insufficiency, stage IV (severe) - due to DM & HTN  Morbid obesity, (no history of sleep apnea evaluation)  HTN (hypertension)  HLD (hyperlipidemia)  Sinus bradycardia  Positive D dimer- 11.17, VQ low prob, LE venous doppler neg  Diastolic dysfunction, grade 2, EF 65-70% May 2013  Rectal bleeding, GI w/u- rectal fissure  UTI (urinary tract infection)   LBBB (left bundle branch block)  Thrombocytopenia, suspected HIT  Edema, possible occluded IVC filter  Clinically improving with reduced edema and discomfort; diuresed well over night.  Creatinine better, INR slowly increaing.  Plan- Tolerated ACE wrap to LE - short break this AM.  Continue IV hep to coumadin A/C for DVT.  INR progressing - now 1.67; she needs 2 days therapeutic INR pre-d/c. Continue with current dose of Lasix & zaroxolyn as she diuresed swell & Cr. Improving. Wrap legs with SCDs while in bed..  -PT/OT; she dose better walking with shoes on. H/H stable - no further evidence of bleed. DM - minimal supplemental insulin required with increased Lantus dose, will change to 18 Units Lantus.  Dispo:  Has personalized HH set up with her cousin coming down to assist on d/c.  [name] W 09/02/2011 9:00 AM

## 2011-09-02 NOTE — Progress Notes (Signed)
Per MD and PT notes, pt improving with ambulation and d/c plan is for home with [facility] and family assist. CSW signing off as no other CSW needs identified. Please re-consult if SNF needed. Wandra Feinstein, MSW, [place] (coverage for Eduard Clos)

## 2011-09-02 NOTE — Progress Notes (Signed)
ANTICOAGULATION CONSULT NOTE - Follow Up Consult  Pharmacy Consult for heparin Indication: new DVT, h/o PE/IVC  Labs:  Basename 09/02/11 2241 09/02/11 1240 09/02/11 0615 09/01/11 0450 08/31/11 0500  HGB -- -- 8.5* 8.5* --  HCT -- -- 26.1* 26.2* 26.7*  PLT -- -- 186 186 181  APTT -- -- -- -- --  LABPROT -- -- 20.0* 17.2* 16.9*  INR -- -- 1.67* 1.38 1.35  HEPARINUNFRC 0.44 0.99* 1.00* -- --  CREATININE -- -- 1.69* 1.82* --  CKTOTAL -- -- -- -- --  CKMB -- -- -- -- --  TROPONINI -- -- -- -- --    Assessment/Plan: 56yo female now therapeutic on heparin after difficulty achieving stable therapeutic level.  Will continue at current rate and confirm stable with am labs.   [name] PharmD BCPS 09/02/2011,11:27 PM

## 2011-09-02 NOTE — Progress Notes (Signed)
UR Completed. Simmons, Jen Benedict F [phone]  

## 2011-09-02 NOTE — Progress Notes (Signed)
ANTICOAGULATION CONSULT NOTE - Follow Up Consult  Pharmacy Consult for Heparin and Coumadin Indication: DVTs; h/o multiple PEs, IVC filter placed 09/2009   Allergies  Allergen Reactions  . Morphine And Related Rash    Patient Measurements: Height: 5\' 3"  (160 cm) Weight: 296 lb 14.4 oz (134.673 kg) IBW/kg (Calculated) : 52.4  Heparin Dosing Weight: 86.3kg  Vital Signs: Temp: 98.1 F (36.7 C) (05/28 1326) Temp src: Tympanic (05/28 1326) BP: 122/59 mmHg (05/28 1326) Pulse Rate: 58  (05/28 1326)  Labs:  Basename 09/02/11 1240 09/02/11 0615 09/01/11 0450 08/31/11 0500  HGB -- 8.5* 8.5* --  HCT -- 26.1* 26.2* 26.7*  PLT -- 186 186 181  APTT -- -- -- --  LABPROT -- 20.0* 17.2* 16.9*  INR -- 1.67* 1.38 1.35  HEPARINUNFRC 0.99* 1.00* 0.44 --  CREATININE -- 1.69* 1.82* --  CKTOTAL -- -- -- --  CKMB -- -- -- --  TROPONINI -- -- -- --    Estimated Creatinine Clearance: 50.6 ml/min (by C-G formula based on Cr of 1.69).   Medications:  Heparin 1500 units/hr  Assessment: 55yof on heparin bridging to Coumadin for bilateral DVTs and hx PE s/p IVC filter placement 6/11. Heparin level (0.99) remains supratherapeutic despite rate decrease - will plan to hold drip x 1 hour and then restart at lower rate. INR (1.67) is subtherapeutic but trended up. - H/H and Plts stable - No significant bleeding reported by the patient  Goal of Therapy:  Heparin level 0.3-0.7 units/ml INR 2-2.2 (per Dr. Marko Plume) Monitor platelets by anticoagulation protocol: Yes   Plan:  1. Hold heparin drip x 1 hr and then restart at 1400 units/hr 2. Check heparin level 6hrs after heparin restart 3. Coumadin 12.5mg  po x 1 today 4. Coumadin book (per pt request)  Earleen Newport [phone] 09/02/2011,2:57 PM

## 2011-09-02 NOTE — Progress Notes (Signed)
Physical Therapy Treatment Patient Details Name: [name] MRN: [id number] DOB: December 11, 1955 Today's Date: 09/02/2011 Time: [id number] PT Time Calculation (min): 22 min  PT Assessment / Plan / Recommendation Comments on Treatment Session  Pt with bil LE edema and progressing well able to ambulate hall today distance needed for home mobility. Pt encouraged to continue attempting ambulation and HEP with staff. Will continue to follow    Follow Up Recommendations       Barriers to Discharge        Equipment Recommendations       Recommendations for Other Services    Frequency     Plan Discharge plan remains appropriate    Precautions / Restrictions Restrictions Weight Bearing Restrictions: No   Pertinent Vitals/Pain No pain    Mobility  Bed Mobility Bed Mobility: Not assessed Transfers Transfers: Sit to Stand;Stand to Sit Sit to Stand: 5: Supervision;With armrests;From chair/3-in-1 Stand to Sit: 5: Supervision;To chair/3-in-1;With armrests Details for Transfer Assistance: Pt with appropriate use of armrests and supervision only for safety Ambulation/Gait Ambulation/Gait Assistance: 4: Min guard Ambulation Distance (Feet): 200 Feet Assistive device: Rolling walker Ambulation/Gait Assistance Details: pt with cueing to step into RW Gait Pattern: Decreased stride length;Trunk flexed;Step-through pattern Gait velocity: decreased    Exercises General Exercises - Lower Extremity Ankle Circles/Pumps: AROM;Both;10 reps;Seated Long Arc Quad: AROM;Both;10 reps;Seated Hip Flexion/Marching: AROM;Both;10 reps;Seated   PT Diagnosis:    PT Problem List:   PT Treatment Interventions:     PT Goals Acute Rehab PT Goals Pt will go Sit to Stand: with modified independence PT Goal: Sit to Stand - Progress: Updated due to goal met Pt will go Stand to Sit: with modified independence PT Goal: Stand to Sit - Progress: Updated due to goals met PT Transfer Goal: Bed to Chair/Chair to  Bed - Progress: Progressing toward goal Pt will Ambulate: >150 feet;with modified independence;with least restrictive assistive device PT Goal: Ambulate - Progress: Updated due to goal met PT Goal: Perform Home Exercise Program - Progress: Met  Visit Information  Last PT Received On: 09/02/11 Assistance Needed: +1    Subjective Data  Subjective: I walked to the bathroom earlier today   Cognition  Overall Cognitive Status: Appears within functional limits for tasks assessed/performed Arousal/Alertness: Awake/alert Orientation Level: Appears intact for tasks assessed Behavior During Session: [facility] for tasks performed    Balance     End of Session PT - End of Session Equipment Utilized During Treatment: Gait belt Activity Tolerance: Patient tolerated treatment well Patient left: in chair;with call bell/phone within reach Nurse Communication: Mobility status    Melford Aase 09/02/2011, 2:12 PM [name], [place]

## 2011-09-03 ENCOUNTER — Other Ambulatory Visit: Payer: Self-pay

## 2011-09-03 ENCOUNTER — Encounter: Payer: Self-pay | Admitting: Pharmacist

## 2011-09-03 DIAGNOSIS — I82409 Acute embolism and thrombosis of unspecified deep veins of unspecified lower extremity: Secondary | ICD-10-CM | POA: Diagnosis not present

## 2011-09-03 DIAGNOSIS — Z7901 Long term (current) use of anticoagulants: Secondary | ICD-10-CM

## 2011-09-03 DIAGNOSIS — Z86711 Personal history of pulmonary embolism: Secondary | ICD-10-CM

## 2011-09-03 HISTORY — DX: Long term (current) use of anticoagulants: Z79.01

## 2011-09-03 LAB — PROTIME-INR
INR: 1.99 — ABNORMAL HIGH (ref 0.00–1.49)
Prothrombin Time: 22.9 seconds — ABNORMAL HIGH (ref 11.6–15.2)

## 2011-09-03 LAB — PROTEIN ELECTROPHORESIS, SERUM
Alpha-1-Globulin: 9.9 % — ABNORMAL HIGH (ref 2.9–4.9)
Alpha-2-Globulin: 17.1 % — ABNORMAL HIGH (ref 7.1–11.8)
Beta 2: 7.3 % — ABNORMAL HIGH (ref 3.2–6.5)
Beta Globulin: 6.9 % (ref 4.7–7.2)
Gamma Globulin: 15.1 % (ref 11.1–18.8)
M-Spike, %: NOT DETECTED g/dL

## 2011-09-03 LAB — GLUCOSE, CAPILLARY
Glucose-Capillary: 198 mg/dL — ABNORMAL HIGH (ref 70–99)
Glucose-Capillary: 178 mg/dL — ABNORMAL HIGH (ref 70–99)

## 2011-09-03 LAB — CBC
HCT: 26.2 % — ABNORMAL LOW (ref 36.0–46.0)
MCHC: 32.4 g/dL (ref 30.0–36.0)
Platelets: 186 10*3/uL (ref 150–400)
RBC: 2.81 MIL/uL — ABNORMAL LOW (ref 3.87–5.11)
RDW: 14.7 % (ref 11.5–15.5)

## 2011-09-03 LAB — HEPARIN LEVEL (UNFRACTIONATED): Heparin Unfractionated: 0.32 IU/mL (ref 0.30–0.70)

## 2011-09-03 MED ORDER — WARFARIN SODIUM 2.5 MG PO TABS
12.5000 mg | ORAL_TABLET | Freq: Once | ORAL | Status: AC
  Administered 2011-09-03: 12.5 mg via ORAL
  Filled 2011-09-03: qty 1

## 2011-09-03 NOTE — Progress Notes (Signed)
Subjective:  Up in chair, she has been able to ambulate in hall.  Objective:  Vital Signs in the last 24 hours: Temp:  [98.1 F (36.7 C)-98.2 F (36.8 C)] 98.2 F (36.8 C) (05/29 0546) Pulse Rate:  [58-67] 60  (05/29 0546) Resp:  [18] 18  (05/29 0546) BP: (122-133)/(59-69) 125/69 mmHg (05/29 0546) SpO2:  [95 %] 95 % (05/29 0546)  Intake/Output from previous day:  Intake/Output Summary (Last 24 hours) at 09/03/11 0928 Last data filed at 09/03/11 0500  Gross per 24 hour  Intake    540 ml  Output   2650 ml  Net  -2110 ml    Physical Exam: General appearance: alert, cooperative, no distress and morbidly obese Lungs: clear to auscultation bilaterally Heart: regular rate and rhythm Chronic LE edema 1+   Rate: 60  Rhythm: normal sinus rhythm  Lab Results:  Basename 09/03/11 0527 09/02/11 0615  WBC 7.6 7.2  HGB 8.5* 8.5*  PLT 186 186    Basename 09/02/11 0615 09/01/11 0450  NA 137 138  K 4.3 4.5  CL 102 104  CO2 23 23  GLUCOSE 171* 154*  BUN 60* 65*  CREATININE 1.69* 1.82*   No results found for this basename: TROPONINI:2,CK,MB:2 in the last 72 hours Hepatic Function Panel No results found for this basename: PROT,ALBUMIN,AST,ALT,ALKPHOS,BILITOT,BILIDIR,IBILI in the last 72 hours No results found for this basename: CHOL in the last 72 hours  Basename 09/03/11 0527  INR 1.99*    Imaging: Imaging results have been reviewed  Cardiac Studies:  Assessment/Plan:   Principal Problem:  *Chest pain, admitting complaint, MI r/o, Myoview low risk Active Problems:  Anemia - due to GI bleed & chronic disease.  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  Diastolic dysfunction, grade 2, EF 65-70% May 2013  Chronic renal insufficiency, stage IV (severe) - due to DM & HTN, SCr  improving-1.6  Rectal bleeding, GI w/u- rectal fissure this admission  Thrombocytopenia, this admission, HIT negative, now stable PLTS on Heparin  Chronic anticoagulation, Coumadin started this admission  Morbid obesity, (no history of sleep apnea evaluation)  HTN (hypertension)  HLD (hyperlipidemia)  Presence of IVC filter, placed June 2011 after 3d PE  Pulmonary HTN, Moderate  PA pressure 75mmHG 09/2009  Sinus bradycardia  LBBB (left bundle branch block)   Plan- She has diuresed with Zaroxolyn 2.5mg  for last two days. She probable needs to go home on Zaroxolyn 2-3 times a week. Her SCr improved with diuresis. Will d/c fole today and stop Heparin in am if INR > 2.0. She may be ready for discharge tomorrow pm.  Kerin Ransom PA-C 09/03/2011, 9:28 AM  She continues to improve symptomatically and by exam, with reduced edema and discomfort; she continued to diurese well over night. Creatinine better, INR now essentially therapeutic at 1.99.  She is able to walk the halls yesterday and today without any difficulty. Plan-  Tolerated ACE wrap to LE - short break this AM.  Continue IV hep to coumadin A/C for DVT. INR progressing - now 1.99; she needs 2 days therapeutic INR pre-d/c.  Continue with current dose of Lasix & zaroxolyn as she still has several liters more to diurese.  This is however a good regimen that stable her to use as an outpatient. Wrap legs with SCDs while in bed..  -PT/OT; she  dose better walking with shoes on.  H/H stable - no further evidence of bleed.  DM - minimal supplemental insulin required with increased Lantus dose, will change to 18 Units Lantus.  Dispo: Has personalized HH set up with her cousin coming down to assist on d/c.  Anticipate discharge as early as tomorrow if felt to be stable.  Leonie Man, M.D., M.S. [facility] [address] [place], [place]  [address]  [phone] Pager # [phone]  09/03/2011 10:39  AM

## 2011-09-03 NOTE — Progress Notes (Signed)
  09/03/2011, 3:27 PM  Hospital day: 22 Heparin day 7, coumadin day 6    Subjective: Doing much better, hoping to go home tomorrow. Able to walk to nurses station today. No bleeding including with bowel movements, and bowels moving well. Still on heparin per pharmacy, and INR today 1.9. She would like to have the coumadin followed at anticoagulation clinic at [facility], as this is most convenient from her home. I have spoken with our pharmacist and will set up her first appointment ~ 09-08-11.  Objective: Vital signs in last 24 hours: Blood pressure 122/71, pulse 61, temperature 98.2 F (36.8 C), temperature source Oral, resp. rate 18, height 5\' 3"  (1.6 m), weight 296 lb 14.4 oz (134.673 kg), SpO2 100.00%. Rouses easily, looks comfortable in recliner with legs elevated, respirations not labored RA. Has ace bandages loosely wrapped on LE. Toes warm. No obvious bleeding. PICC RUE.  Intake/Output from previous day: 05/28 0701 - 05/29 0700 In: 780 [P.O.:780] Out: 2650 [Urine:2650] Intake/Output this shift: Total I/O In: 480 [P.O.:480] Out: -     Lab Results:  Basename 09/03/11 0527 09/02/11 0615  WBC 7.6 7.2  HGB 8.5* 8.5*  HCT 26.2* 26.1*  PLT 186 186   BMET  Basename 09/02/11 0615 09/01/11 0450  NA 137 138  K 4.3 4.5  CL 102 104  CO2 23 23  GLUCOSE 171* 154*  BUN 60* 65*  CREATININE 1.69* 1.82*  CALCIUM 9.3 9.1   SPEP 08-29-11 no M spike Kappa/lambda light chain ratio WNL Studies/Results: No results found.   Assessment/Plan: 1. Thrombocytopenia early in hospitalization, resolved, not HIT. 2.DVTs below IVC filter, symptomatic with significant LE swelling. Symptoms improved on heparin, being converted to coumadin by pharmacy. Plan indefinite coumadin as long as no complications. Would keep INR low therapeutic, ~ 2.0 to 2.2 3.Bleeding from rectal fissure prior to anticoagulation during this admission, resolved 4.history of retinal bleeds with previous  anticoagulation. Needs to schedule back to ophthalmology after DC, as she is due appointment there 5.morbid obesity 6.diabetes 7sinus bradycardia, HTN, LBBB 8.multifactorial anemia: bleeding earlier this admission, chronic kidney disease, chronic disease 9. Hx PEs, post IVC filter after retinal bleeds  Eola Waldrep P

## 2011-09-03 NOTE — Progress Notes (Signed)
ANTICOAGULATION CONSULT NOTE - Follow Up Consult  Pharmacy Consult for Heparin and Coumadin Indication: DVTs; h/o multiple PEs, IVC filter placed 09/2009  Allergies  Allergen Reactions  . Morphine And Related Rash    Patient Measurements: Height: 5\' 3"  (160 cm) Weight: 296 lb 14.4 oz (134.673 kg) IBW/kg (Calculated) : 52.4  Heparin Dosing Weight: 86.3kg  Vital Signs: Temp: 98.2 F (36.8 C) (05/29 0546) Temp src: Oral (05/29 0546) BP: 125/69 mmHg (05/29 0546) Pulse Rate: 60  (05/29 0546)  Labs:  Flo Shanks 09/03/11 0527 09/02/11 2241 09/02/11 1240 09/02/11 0615 09/01/11 0450  HGB 8.5* -- -- 8.5* --  HCT 26.2* -- -- 26.1* 26.2*  PLT 186 -- -- 186 186  APTT -- -- -- -- --  LABPROT 22.9* -- -- 20.0* 17.2*  INR 1.99* -- -- 1.67* 1.38  HEPARINUNFRC 0.32 0.44 0.99* -- --  CREATININE -- -- -- 1.69* 1.82*  CKTOTAL -- -- -- -- --  CKMB -- -- -- -- --  TROPONINI -- -- -- -- --    Estimated Creatinine Clearance: 50.6 ml/min (by C-G formula based on Cr of 1.69).   Medications:  Heparin 1400 units/hr  Assessment: 55yof on heparin bridging to Coumadin for bilateral DVTs and hx PE s/p IVC filter placement 6/11. Heparin level (0.32) is therapeutic but has trended down to the lower end of goal range - will increase rate slightly and follow-up AM INR. Noted Cardiology plans to discontinue heparin tomorrow if INR >2. Current INR (1.99) is just below goal level. - H/H and Plts stable - No significant bleeding reported  Goal of Therapy:  Heparin level 0.3-0.7 units/ml INR 2-2.2 (per Dr. Marko Plume) Monitor platelets by anticoagulation protocol: Yes   Plan:  1. Increase heparin drip to 1450 units/hr (14.5 ml/hr) 2. Repeat Coumadin 12.5mg  po x 1 today 3. Follow-up AM heparin level, INR and discharge plans  Earleen Newport [phone] 09/03/2011,10:11 AM

## 2011-09-03 NOTE — Progress Notes (Signed)
Referral to [facility] Coumadin clinic in progress in anticipation for discharge from [facility] soon w/ recent bilateral DVT w/ possibly thrombosed IVC filter (originally placed 09/2009 for recurrent PE). Pt is high risk for bleeding while on Coumadin as she has h/o retinal bleeding when on Coumadin in past.  She has thrombocytopenia & as IP at [facility], developed hematochezia while on Heparin gtt.  HIT was negative. Dr. Marko Plume, as a result, would like pts INR goal to be 2-2.2 Pt was started on Coumadin 10 mg x 3 days (5/24, 5/25 & 5/26) then increased to 15 mg on 5/27 & 12.5 mg x 2 days (5/28 & 5/29).  INR = 1.99 on 5/29. We anticipate pt needing to be seen next week at [facility].  I will contact pt & set up her first appt. Dr. Marko Plume would like pt to see her eye doctor soon after discharge to be monitored closely given h/o retinal bleeding. Budd Palmer, Pharm.D.

## 2011-09-03 NOTE — Progress Notes (Signed)
Spoke with Cristal Deer  at [facility] unit [address] and gave appt. For  10-01-11 at 0900 with Dr. Marko Plume for follow up.   Our Coumadin Clinic will be in touch with patient to give appt. For clinic on 6-3 or 09-09-11.

## 2011-09-04 LAB — PROTIME-INR: Prothrombin Time: 23.7 seconds — ABNORMAL HIGH (ref 11.6–15.2)

## 2011-09-04 LAB — HEPARIN LEVEL (UNFRACTIONATED): Heparin Unfractionated: 0.85 IU/mL — ABNORMAL HIGH (ref 0.30–0.70)

## 2011-09-04 LAB — CBC
Hemoglobin: 9.4 g/dL — ABNORMAL LOW (ref 12.0–15.0)
Platelets: 208 10*3/uL (ref 150–400)
RBC: 3.13 MIL/uL — ABNORMAL LOW (ref 3.87–5.11)

## 2011-09-04 MED ORDER — PRO-STAT SUGAR FREE PO LIQD: 30.0000 mL | Freq: Two times a day (BID) | ORAL | Status: DC

## 2011-09-04 MED ORDER — GLUCERNA SHAKE PO LIQD: 237.0000 mL | Freq: Two times a day (BID) | ORAL | Status: DC

## 2011-09-04 MED ORDER — POLYETHYLENE GLYCOL 3350 17 G PO PACK: 17.0000 g | PACK | Freq: Two times a day (BID) | ORAL | Status: AC

## 2011-09-04 MED ORDER — WARFARIN SODIUM 7.5 MG PO TABS
7.5000 mg | ORAL_TABLET | Freq: Once | ORAL | Status: AC
  Administered 2011-09-04: 7.5 mg via ORAL
  Filled 2011-09-04: qty 1

## 2011-09-04 MED ORDER — METOLAZONE 2.5 MG PO TABS: ORAL_TABLET | ORAL | Status: DC

## 2011-09-04 MED ORDER — FUROSEMIDE 80 MG PO TABS: 80.0000 mg | ORAL_TABLET | Freq: Every day | ORAL | Status: DC

## 2011-09-04 MED ORDER — ACETAMINOPHEN 325 MG PO TABS: 650.0000 mg | ORAL_TABLET | ORAL | Status: DC | PRN

## 2011-09-04 MED ORDER — DSS 100 MG PO CAPS: 200.0000 mg | ORAL_CAPSULE | Freq: Every morning | ORAL | Status: AC

## 2011-09-04 MED ORDER — WARFARIN SODIUM 2.5 MG PO TABS: ORAL_TABLET | ORAL | Status: DC

## 2011-09-04 MED ORDER — WARFARIN SODIUM 2.5 MG PO TABS
12.5000 mg | ORAL_TABLET | Freq: Once | ORAL | Status: DC
  Filled 2011-09-04: qty 1

## 2011-09-04 MED ORDER — PANTOPRAZOLE SODIUM 40 MG PO TBEC: 40.0000 mg | DELAYED_RELEASE_TABLET | Freq: Every day | ORAL | Status: DC

## 2011-09-04 MED ORDER — LORATADINE 10 MG PO TABS: 10.0000 mg | ORAL_TABLET | Freq: Every day | ORAL | Status: DC | PRN

## 2011-09-04 NOTE — Discharge Instructions (Signed)
Warfarin  Warfarin is a blood thinner (anticoagulant) medicine. It is used to keep clots from forming in your blood. When you take warfarin, you may bruise easily. You may also bleed a little longer if you cut yourself.  Before taking warfarin, tell your doctor if:  You take any other medicine for your heart or blood pressure.   You are pregnant.   You plan to get pregnant.   You are breastfeeding.   You are allergic to any medicine.  HOME CARE  Take warfarin as told by your doctor. Do not stop the medicine unless your doctor tells you to.   Take your medicine at the same time every day.   Do not take anything that has aspirin in it unless your doctor says it is okay.   Do not drink alcohol.   Tell all your doctors and dentists that you are taking warfarin before they treat you or give you any medicines.   Keep all your appointments for doctor visits and blood tests.   Keep warfarin out of reach of children. Do not share warfarin with anyone.   Eat about the same amount of vitamin K foods every day.   High vitamin K foods: Beef liver. Pork liver. Green tea. Broccoli. Brussels sprouts. Cauliflower. Chickpeas. Kale. Spinach. Turnip greens.   Medium vitamin K foods: Chicken liver. Pork tenderloin. Cheddar cheese. Rolled oats. Coffee. Asparagus. Cabbage. Iceberg lettuce.   Low vitamin K foods: Apples. Butter. Bananas. Skim or 1% milk. Orange rice. Canned pears. White bread. Strawberries. Corn. Tomatoes. Green beans. Eggs. Potatoes. Berniece Salines. Pumpkin. Chicken breasts. Ground beef. Oil (except soybean oil).  GET HELP RIGHT AWAY IF:  You miss a dose of warfarin. Do not take 2 doses at the same time.   You have a skin rash.   You have heavy or unusual bleeding.   There is blood in your pee (urine) or poop (stool).   You have side effects from medicine that do not get better after a few days.  MAKE SURE YOU:  Understand these instructions.   Will watch your condition.   Will  get help right away if you are not doing well or get worse.  Document Released: 04/26/2010 Document Revised: 03/13/2011 Document Reviewed: 04/26/2010 [facility] Patient Information 2012 [place].   Dr Mariana Kaufman office will contact you to get INR checked in 3-4 days

## 2011-09-04 NOTE — Discharge Summary (Signed)
Patient ID: [name],  MRN: [id number], DOB/AGE: Feb 10, 1956 56 y.o.  Admit date: 08/13/2011 Discharge date: 09/04/2011  Primary Care Provider: Dr [name] Primary Cardiologist: Dr Sallyanne Kuster  Discharge Diagnoses Principal Problem:  *Chest pain, admitting complaint, MI r/o, Myoview low risk Active Problems:  Anemia - due to GI bleed & chronic disease.  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  Chronic anticoagulation, Coumadin started this admission  Morbid obesity, (no history of sleep apnea evaluation)  HTN (hypertension)  HLD (hyperlipidemia)  Presence of IVC filter, placed June 2011 after 3d PE  Pulmonary HTN, Moderate  PA pressure 77mmHG 09/2009  Sinus bradycardia  LBBB (left bundle branch block)    Procedures: CT with contrast Abd/Pelvis 08/17/11                        2D echo 08/14/11                        Myoview 08/17/11                         VQ 08/14/11                         LE Venous dopplers 08/14/11, 08/26/11                                              Hospital Course: [name] is a pleasant, morbidly obese female with multiple medical problems. We had seen her in the past during one of her admissions for pulmonary embolism. She has had 3 PEs in the past. She eventually had an IVC filter placed June 2011. She could not take Coumadin because of a problem with retinal bleeding though she says she was treated for that. She presented 08/13/11 with chest pain worrisome for [place]. Her Troponin  was negative but her d-dimer was positive at 11. She was Rx'd with IV Heparin when she was admitted. Her VQ was negative for PE and her LE venous dopplers on admission were negative for DVT. It was noted on admission that her SCr was elevated at 2.32. This was higher than her baseline. She was also bradycardic. Her ARB, Aldactone, and Glucophage were stopped. On the 10th she had rectal bleeding and dropped her HGB. CT with contrast of the abd was unremarkable. Heparin was stopped and it was also documented that the patient had become thrombocytopenic. Myoview done 08/17/11 was non ischemic. 2D echo showed good LVF with grade 2 diastolic dysfunction, and moderate pulm HTN. A Hematology consult was done by Dr Marko Plume 08/18/11. HIT panel was ultimately determined to be negative. By this time she had developed painful lower extremity edema and repeat venous dopplers were now positive for bilat LE DVT. Interventional Radiology and Vasc Surg were consulted. She was not felt to be a candidate for thrombolytics  or thrombectomy. Heparin was resumed and Coumadin started. She was diuresed with Lasix and Zaroxolyn and her SCr actually improved to 1.6 by discharge.  Her weight is 132.8kg at discharge, her INR 2.0 and her HGB 9.4. Dr Marko Plume has arranged follow up for her INR and an office visit with her later this month. She developed a painful hematoma on her Lt wrist on the day of discharge from blood draws. I cut her Coumadin from 12.5mg  daily to 7.5 mg daily at discharge. Her INR goal is 2.0-2.2. She will have an INR in 3-4 days at the [facility], (Dr Mariana Kaufman office to arrange).   Discharge Vitals:  Blood pressure 124/55, pulse 51, temperature 97.9 F (36.6 C), temperature source Oral, resp. rate 19, height 5\' 3"  (1.6 m), weight 132.8 kg (292 lb 12.3 oz), SpO2 96.00%.    Labs: Results for orders placed during the hospital encounter of 08/13/11 (from the past 48 hour(s))  GLUCOSE, CAPILLARY     Status: Abnormal    Collection Time   09/02/11  4:09 PM      Component Value Range Comment   Glucose-Capillary 172 (*) 70 - 99 (mg/dL)   GLUCOSE, CAPILLARY     Status: Abnormal   Collection Time   09/02/11 10:04 PM      Component Value Range Comment   Glucose-Capillary 183 (*) 70 - 99 (mg/dL)   HEPARIN LEVEL (UNFRACTIONATED)     Status: Normal   Collection Time   09/02/11 10:41 PM      Component Value Range Comment   Heparin Unfractionated 0.44  0.30 - 0.70 (IU/mL)   CBC     Status: Abnormal   Collection Time   09/03/11  5:27 AM      Component Value Range Comment   WBC 7.6  4.0 - 10.5 (K/uL)    RBC 2.81 (*) 3.87 - 5.11 (MIL/uL)    Hemoglobin 8.5 (*) 12.0 - 15.0 (g/dL)    HCT 26.2 (*) 36.0 - 46.0 (%)    MCV 93.2  78.0 - 100.0 (fL)    MCH 30.2  26.0 - 34.0 (pg)    MCHC 32.4  30.0 - 36.0 (g/dL)    RDW 14.7  11.5 - 15.5 (%)    Platelets 186  150 - 400 (K/uL)   HEPARIN LEVEL (UNFRACTIONATED)     Status: Normal   Collection Time   09/03/11  5:27 AM      Component Value Range Comment   Heparin Unfractionated 0.32  0.30 - 0.70 (IU/mL)   PROTIME-INR     Status: Abnormal   Collection Time   09/03/11  5:27 AM      Component Value Range Comment   Prothrombin Time 22.9 (*) 11.6 - 15.2 (seconds)    INR 1.99 (*) 0.00 - 1.49    GLUCOSE, CAPILLARY     Status: Abnormal   Collection Time   09/03/11  6:07 AM      Component Value Range Comment   Glucose-Capillary 146 (*) 70 - 99 (mg/dL)   GLUCOSE, CAPILLARY     Status: Abnormal   Collection Time   09/03/11 11:16 AM      Component Value Range Comment   Glucose-Capillary 228 (*) 70 - 99 (mg/dL)    Comment 1 Documented in Chart      Comment 2 Notify RN     GLUCOSE, CAPILLARY     Status: Abnormal   Collection Time   09/03/11  4:06 PM  Component Value Range Comment   Glucose-Capillary 198 (*) 70 - 99 (mg/dL)    Comment 1 Documented in Chart      Comment 2 Notify RN     GLUCOSE, CAPILLARY     Status: Abnormal   Collection Time   09/03/11  8:37 PM       Component Value Range Comment   Glucose-Capillary 178 (*) 70 - 99 (mg/dL)    Comment 1 Documented in Chart      Comment 2 Notify RN     CBC     Status: Abnormal   Collection Time   09/04/11  5:23 AM      Component Value Range Comment   WBC 8.8  4.0 - 10.5 (K/uL)    RBC 3.13 (*) 3.87 - 5.11 (MIL/uL)    Hemoglobin 9.4 (*) 12.0 - 15.0 (g/dL)    HCT 29.3 (*) 36.0 - 46.0 (%)    MCV 93.6  78.0 - 100.0 (fL)    MCH 30.0  26.0 - 34.0 (pg)    MCHC 32.1  30.0 - 36.0 (g/dL)    RDW 14.8  11.5 - 15.5 (%)    Platelets 208  150 - 400 (K/uL)   HEPARIN LEVEL (UNFRACTIONATED)     Status: Abnormal   Collection Time   09/04/11  5:23 AM      Component Value Range Comment   Heparin Unfractionated 0.85 (*) 0.30 - 0.70 (IU/mL)   PROTIME-INR     Status: Abnormal   Collection Time   09/04/11  5:23 AM      Component Value Range Comment   Prothrombin Time 23.7 (*) 11.6 - 15.2 (seconds)    INR 2.07 (*) 0.00 - 1.49    GLUCOSE, CAPILLARY     Status: Abnormal   Collection Time   09/04/11  6:53 AM      Component Value Range Comment   Glucose-Capillary 158 (*) 70 - 99 (mg/dL)   GLUCOSE, CAPILLARY     Status: Abnormal   Collection Time   09/04/11 11:17 AM      Component Value Range Comment   Glucose-Capillary 227 (*) 70 - 99 (mg/dL)     Disposition:  Follow-up Information    Follow up with Gordy Levan, MD on 10/01/2011. (0900)    Contact information:   [place] [phone]       Follow up with Erlene Quan, PA. (office will call)    Contact information:   [address] [place] [phone]          Discharge Medications:  Medication List  As of 09/04/2011 12:54 PM   STOP taking these medications         glimepiride 4 MG tablet      metFORMIN 1000 MG tablet      olmesartan 20 MG tablet      spironolactone 25 MG tablet         TAKE these medications         acetaminophen 325 MG tablet   Commonly  known as: TYLENOL   Take 2 tablets (650 mg total) by mouth every 4 (four) hours as needed.      aspirin EC 81 MG tablet   Take 81 mg by mouth daily.      citalopram 20 MG tablet   Commonly known as: CELEXA   Take 20 mg by mouth at bedtime.      diazepam 5 MG tablet  Commonly known as: VALIUM   Take 5 mg by mouth at bedtime as needed. For muscle spasms.      DSS 100 MG Caps   Take 200 mg by mouth every morning.      feeding supplement Liqd   Take 237 mLs by mouth 2 (two) times daily between meals.      feeding supplement Liqd   Take 30 mLs by mouth 2 (two) times daily with a meal.      Fish Oil 1200 MG Caps   Take 1 capsule by mouth 2 (two) times daily.      furosemide 80 MG tablet   Commonly known as: LASIX   Take 1 tablet (80 mg total) by mouth daily.      HYDROcodone-acetaminophen 5-500 MG per tablet   Commonly known as: VICODIN   Take 1 tablet by mouth every 8 (eight) hours as needed. For pain.      insulin glargine 100 UNIT/ML injection   Commonly known as: LANTUS   Inject 17-21 Units into the skin See admin instructions. She uses a sliding scale.      levothyroxine 25 MCG tablet   Commonly known as: SYNTHROID, LEVOTHROID   Take 25 mcg by mouth daily.      loratadine 10 MG tablet   Commonly known as: CLARITIN   Take 1 tablet (10 mg total) by mouth daily as needed.      metolazone 2.5 MG tablet   Commonly known as: ZAROXOLYN   Take one tablet every Monday/Wednesday?Friday. Take 30 minutes prior to taking Furosemide      metoprolol 50 MG tablet   Commonly known as: LOPRESSOR   Take 50 mg by mouth 2 (two) times daily.      mulitivitamin with minerals Tabs   Take 1 tablet by mouth daily.      OVER THE COUNTER MEDICATION   Take 3 tablets by mouth 2 (two) times daily. Raspberry Ketones.      OVER THE COUNTER MEDICATION   Take 2 tablets by mouth daily. Cherry 1000mg  tablets.      pantoprazole 40 MG tablet   Commonly known as: PROTONIX   Take 1 tablet (40  mg total) by mouth daily at 12 noon.      polyethylene glycol packet   Commonly known as: MIRALAX / GLYCOLAX   Take 17 g by mouth 2 (two) times daily.      warfarin 2.5 MG tablet   Commonly known as: COUMADIN   Take 3 tablets a day or as directed            Outstanding Labs/Studies  Duration of Discharge Encounter: Greater than 30 minutes including physician time.  Angelena Form PA-C 09/04/2011 12:54 PM

## 2011-09-04 NOTE — Progress Notes (Signed)
ANTICOAGULATION CONSULT NOTE - Follow Up Consult  Pharmacy Consult for Heparin and Coumadin Indication: DVTs; h/o multiple PEs, IVC filter placed 09/2009  Allergies  Allergen Reactions  . Morphine And Related Rash    Patient Measurements: Height: 5\' 3"  (160 cm) Weight: 292 lb 12.3 oz (132.8 kg) IBW/kg (Calculated) : 52.4  Heparin Dosing Weight: 86.3kg  Vital Signs: Temp: 97.9 F (36.6 C) (05/30 0505) Temp src: Oral (05/30 0505) BP: 124/55 mmHg (05/30 0505) Pulse Rate: 51  (05/30 0505)  Labs:  Basename 09/04/11 0523 09/03/11 0527 09/02/11 2241 09/02/11 0615  HGB 9.4* 8.5* -- --  HCT 29.3* 26.2* -- 26.1*  PLT 208 186 -- 186  APTT -- -- -- --  LABPROT 23.7* 22.9* -- 20.0*  INR 2.07* 1.99* -- 1.67*  HEPARINUNFRC 0.85* 0.32 0.44 --  CREATININE -- -- -- 1.69*  CKTOTAL -- -- -- --  CKMB -- -- -- --  TROPONINI -- -- -- --    Estimated Creatinine Clearance: 50.2 ml/min (by C-G formula based on Cr of 1.69).   Medications:  Heparin 1450 units/hr  Assessment: 55yof on heparin bridging to Coumadin for bilateral DVTs and hx PE s/p IVC filter placement 6/11. INR (2.07) is now therapeutic and is trending up nicely with Coumadin 12.5mg  - will repeat. Heparin level (0.85) is supratherapeutic this AM. Cardiology has discontinued heparin drip now that INR >2. Noted plans for possible discharge today with INR follow-up early next week. - H/H and Plts improving - No significant bleeding reported  Goal of Therapy:  INR 2-2.2 (per Dr. Marko Plume) Monitor platelets by anticoagulation protocol: Yes   Plan:  1. Coumadin 12.5mg  po x 1 today 2. Discontinue heparin levels 3. Follow-up AM INR and discharge plans  Earleen Newport [id number] 09/04/2011,8:58 AM

## 2011-09-04 NOTE — Progress Notes (Signed)
Physical Therapy Treatment Patient Details Name: [name] MRN: [id number] DOB: 10/11/1955 Today's Date: 09/04/2011 Time: [id number] PT Time Calculation (min): 23 min  PT Assessment / Plan / Recommendation Comments on Treatment Session  Pt with limited ambulation today due to pain meds but able to use Right wrist on RW for ambulation without difficulty. Pt bil LE wrapped with ACE and pt educated for technique. Will continue to follow to return pt to PLOF    Follow Up Recommendations       Barriers to Discharge        Equipment Recommendations       Recommendations for Other Services    Frequency     Plan Discharge plan remains appropriate    Precautions / Restrictions Precautions Precautions: Fall   Pertinent Vitals/Pain 3/10 wrist pain with premedicated    Mobility  Bed Mobility Bed Mobility: Not assessed Transfers Transfers: Sit to Stand;Stand to Sit Sit to Stand: 4: Min guard;4: Min assist;With armrests;From chair/3-in-1 Stand to Sit: To chair/3-in-1;With armrests Details for Transfer Assistance: x 3 trials min assist the first trial and minguard the 2nd two. Pt with repeated sitting during ambulation due to feeling dizzy due to pain medicine. Ambulation/Gait Ambulation/Gait Assistance: 4: Min guard Ambulation Distance (Feet): 150 Feet (with 3 seated rests) Assistive device: Rolling walker Ambulation/Gait Assistance Details: +1 to follow with chair for safety Gait Pattern: Decreased stride length;Step-through pattern Stairs: No    Exercises General Exercises - Lower Extremity Long Arc Quad: AROM;Both;20 reps;Seated Hip Flexion/Marching: AROM;Both;20 reps;Seated   PT Diagnosis:    PT Problem List:   PT Treatment Interventions:     PT Goals Acute Rehab PT Goals PT Goal: Sit to Stand - Progress: Progressing toward goal PT Goal: Stand to Sit - Progress: Progressing toward goal PT Goal: Ambulate - Progress: Progressing toward goal  Visit Information  Last  PT Received On: 09/04/11 Assistance Needed: +1    Subjective Data  Subjective: I had drugs this morning   Cognition  Overall Cognitive Status: Appears within functional limits for tasks assessed/performed Arousal/Alertness: Awake/alert Orientation Level: Appears intact for tasks assessed Behavior During Session: [facility] for tasks performed    Balance     End of Session PT - End of Session Equipment Utilized During Treatment: Gait belt Activity Tolerance: Patient tolerated treatment well Patient left: in chair;with call bell/phone within reach Nurse Communication: Mobility status    Melford Aase 09/04/2011, 9:46 AM [name], [place]

## 2011-09-04 NOTE — Progress Notes (Signed)
Subjective:  C/O pain Lt wrist secondary to hematoma from lab draw  Objective:  Vital Signs in the last 24 hours: Temp:  [97.9 F (36.6 C)-99.1 F (37.3 C)] 97.9 F (36.6 C) (05/30 0505) Pulse Rate:  [51-64] 51  (05/30 0505) Resp:  [18-19] 19  (05/30 0505) BP: (122-155)/(55-74) 124/55 mmHg (05/30 0505) SpO2:  [96 %-100 %] 96 % (05/30 0505) Weight:  [132.8 kg (292 lb 12.3 oz)] 132.8 kg (292 lb 12.3 oz) (05/30 0505)  Intake/Output from previous day:  Intake/Output Summary (Last 24 hours) at 09/04/11 0851 Last data filed at 09/03/11 2048  Gross per 24 hour  Intake   1200 ml  Output   2350 ml  Net  -1150 ml    Physical Exam: General appearance: alert, cooperative, no distress and morbidly obese Lungs: clear to auscultation bilaterally Heart: regular rate and rhythm Extrem: chronic 2+ edema   Rate: 60  Rhythm: normal sinus rhythm  Lab Results:  Basename 09/04/11 0523 09/03/11 0527  WBC 8.8 7.6  HGB 9.4* 8.5*  PLT 208 186    Basename 09/02/11 0615  NA 137  K 4.3  CL 102  CO2 23  GLUCOSE 171*  BUN 60*  CREATININE 1.69*   No results found for this basename: TROPONINI:2,CK,MB:2 in the last 72 hours Hepatic Function Panel No results found for this basename: PROT,ALBUMIN,AST,ALT,ALKPHOS,BILITOT,BILIDIR,IBILI in the last 72 hours No results found for this basename: CHOL in the last 72 hours  Basename 09/04/11 0523  INR 2.07*    Imaging: Imaging results have been reviewed  Cardiac Studies:  Assessment/Plan:   Principal Problem:  *Chest pain, admitting complaint, MI r/o, Myoview low risk Active Problems:  Anemia - due to GI bleed & chronic disease.  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  Diastolic dysfunction, grade 2, EF 65-70% May 2013  Chronic renal  insufficiency, stage IV (severe) - due to DM & HTN, SCr improving-1.6  Rectal bleeding, GI w/u- rectal fissure this admission  Thrombocytopenia, this admission, HIT negative, now stable PLTS on Heparin  Chronic anticoagulation, Coumadin started this admission  Morbid obesity, (no history of sleep apnea evaluation)  HTN (hypertension)  HLD (hyperlipidemia)  Presence of IVC filter, placed June 2011 after 3d PE  Pulmonary HTN, Moderate  PA pressure 51mmHG 09/2009  Sinus bradycardia  LBBB (left bundle branch block)  Plan- d/c foley, d/c heparin, home later if she can void and her wrist is OK. Appreciate Dr Mariana Kaufman help. The patient will have INR's done at the [facility]. Follow up with Dr Marko Plume has been arranged.    Kerin Ransom PA-C 09/04/2011, 8:51 AM    Agree with note written by Kerin Ransom [facility]  Looks great! Lower extremity edema improved. I/Os neg. INR therapeutic. Agree with D/Cing IV hep and sending home later today if right wrist stable.  Lorretta Harp 09/04/2011 10:59 AM

## 2011-09-08 ENCOUNTER — Ambulatory Visit (HOSPITAL_BASED_OUTPATIENT_CLINIC_OR_DEPARTMENT_OTHER): Payer: Self-pay | Admitting: Pharmacist

## 2011-09-08 ENCOUNTER — Other Ambulatory Visit (HOSPITAL_BASED_OUTPATIENT_CLINIC_OR_DEPARTMENT_OTHER): Payer: Self-pay | Admitting: Lab

## 2011-09-08 DIAGNOSIS — I82409 Acute embolism and thrombosis of unspecified deep veins of unspecified lower extremity: Secondary | ICD-10-CM

## 2011-09-08 DIAGNOSIS — Z86711 Personal history of pulmonary embolism: Secondary | ICD-10-CM

## 2011-09-08 LAB — CBC WITH DIFFERENTIAL/PLATELET
BASO%: 0.4 % (ref 0.0–2.0)
HCT: 27.1 % — ABNORMAL LOW (ref 34.8–46.6)
LYMPH%: 19.5 % (ref 14.0–49.7)
MCH: 29.3 pg (ref 25.1–34.0)
MCHC: 31.7 g/dL (ref 31.5–36.0)
MONO#: 0.5 10*3/uL (ref 0.1–0.9)
NEUT%: 71.3 % (ref 38.4–76.8)
Platelets: 224 10*3/uL (ref 145–400)

## 2011-09-08 NOTE — Progress Notes (Signed)
INR = 3.4 (goal = 2-2.2)  Hgb = 8.6 g/dL; Pltc = 224 In past 1 week, pt has had a variety of coumadin doses as IP but was discharged on 7.5 mg/day.  She has received 72.5 mg total in 1 week's time. Bruising of L forearm & posterior arm.  Weight bearing arm. No vision changes.  No obvious ocular bleeding. Pt has some SOB but able to ambulate w/o stopping for breathing.  Not dyspneic when arrived in clinic today.  Was using a walker.   Arvin Collard present during visit today.  Pt does not drive so she depends on someone to bring her. Diet: eats 2-3 salads/week.  Usually fresh spinach salads; occasionally eats romaine lettuce.  Eats turnip greens approx once/week.  Sometimes snacks on fresh broccoli & baby carrots.  Before hospitalization, was not eating 3 meals/day.  She often ate late lunches so she'd skip supper & just snack.  She does not drink EtOH.   She received Coumadin education as IP. Her PCP is Dr. Fabio Pierce [facility] area; has his own practice).  She has appt w/ him tomorrow.  I provided her w/ her labs from today to give him at that visit. INR elevated today.  Bruising present.  Hgb low. I have d/w Dr. Marko Plume.  We will hold 2 days of Coumadin then restart at lower dose = 5 mg/day.   We discussed importance of maintaining consistent diet (vit K). Return Fri 6/7 for protime. Budd Palmer, Pharm.D.

## 2011-09-12 ENCOUNTER — Ambulatory Visit (HOSPITAL_BASED_OUTPATIENT_CLINIC_OR_DEPARTMENT_OTHER): Payer: Self-pay | Admitting: Pharmacist

## 2011-09-12 ENCOUNTER — Other Ambulatory Visit (HOSPITAL_BASED_OUTPATIENT_CLINIC_OR_DEPARTMENT_OTHER): Payer: Self-pay | Admitting: Lab

## 2011-09-12 ENCOUNTER — Other Ambulatory Visit: Payer: Self-pay | Admitting: *Deleted

## 2011-09-12 DIAGNOSIS — Z7901 Long term (current) use of anticoagulants: Secondary | ICD-10-CM

## 2011-09-12 DIAGNOSIS — I82409 Acute embolism and thrombosis of unspecified deep veins of unspecified lower extremity: Secondary | ICD-10-CM

## 2011-09-12 LAB — PROTIME-INR: INR: 2.3 (ref 2.00–3.50)

## 2011-09-12 NOTE — Progress Notes (Signed)
INR slightly supratherapeutic today (2.3) after holding 2 doses and decreasing her dose to 5mg  daily.  She has only received 2 doses at this level so far.  She is trying to keep her diet consistent regarding Vitamin K - the only food she tends to eat that is high in Vitamin K is spinach.  She avoids everything else.  No further problems with bleeding or bruising.  The bruises on her wt-bearing arm are healing.    Spoke with Dr. Marko Plume about how to manage her Coumadin dose moving forward today.  Since her INR has had a nice drop from earlier this week and she has only had 2 doses of the 5mg  daily, we will continue her on 5mg  daily for now.  She will return next week for a repeat INR to see how she stabilizes on this dose.  The patient does have an IVC filter in place, it is OK if her INR drops lower than 2.0.

## 2011-09-19 ENCOUNTER — Other Ambulatory Visit (HOSPITAL_BASED_OUTPATIENT_CLINIC_OR_DEPARTMENT_OTHER): Payer: Self-pay | Admitting: Lab

## 2011-09-19 ENCOUNTER — Ambulatory Visit (HOSPITAL_BASED_OUTPATIENT_CLINIC_OR_DEPARTMENT_OTHER): Payer: Self-pay | Admitting: Pharmacist

## 2011-09-19 DIAGNOSIS — I82409 Acute embolism and thrombosis of unspecified deep veins of unspecified lower extremity: Secondary | ICD-10-CM

## 2011-09-19 DIAGNOSIS — Z7901 Long term (current) use of anticoagulants: Secondary | ICD-10-CM

## 2011-09-19 LAB — PROTIME-INR: INR: 2.1 (ref 2.00–3.50)

## 2011-09-19 NOTE — Progress Notes (Signed)
INR = 2.1 on 5 mg/day (goal = 2-2.2) No bleeding.  Bruising on L arm is much improved. Pt made appt w/ her eye doctor for 10/06/11. INR at goal so we will keep her on 5 mg/day. Return on 6/26 (same day as MD appt). Budd Palmer, Pharm.D.

## 2011-10-01 ENCOUNTER — Encounter: Payer: Self-pay | Admitting: Oncology

## 2011-10-01 ENCOUNTER — Ambulatory Visit (HOSPITAL_BASED_OUTPATIENT_CLINIC_OR_DEPARTMENT_OTHER): Payer: Self-pay | Admitting: Pharmacist

## 2011-10-01 ENCOUNTER — Other Ambulatory Visit (HOSPITAL_BASED_OUTPATIENT_CLINIC_OR_DEPARTMENT_OTHER): Payer: Self-pay | Admitting: Lab

## 2011-10-01 ENCOUNTER — Ambulatory Visit: Payer: Self-pay

## 2011-10-01 ENCOUNTER — Ambulatory Visit (HOSPITAL_BASED_OUTPATIENT_CLINIC_OR_DEPARTMENT_OTHER): Payer: Self-pay | Admitting: Oncology

## 2011-10-01 VITALS — BP 155/69 | HR 57 | Temp 97.6°F | Ht 63.0 in | Wt 282.0 lb

## 2011-10-01 DIAGNOSIS — Z86711 Personal history of pulmonary embolism: Secondary | ICD-10-CM

## 2011-10-01 DIAGNOSIS — I82409 Acute embolism and thrombosis of unspecified deep veins of unspecified lower extremity: Secondary | ICD-10-CM

## 2011-10-01 DIAGNOSIS — Z7901 Long term (current) use of anticoagulants: Secondary | ICD-10-CM

## 2011-10-01 DIAGNOSIS — D649 Anemia, unspecified: Secondary | ICD-10-CM

## 2011-10-01 LAB — PROTIME-INR: INR: 1.6 — ABNORMAL LOW (ref 2.00–3.50)

## 2011-10-01 NOTE — Progress Notes (Signed)
Patient came in today as a new patient and she has no insurance,she has apply for her disability.I ask her if she has apply for medicaid and she said no,I talk with her disability case worker Arline Asp and her number is [phone] told me to tell the patient to go a head and apply for Medicaid and once she find out who her case worker is call me and let me know so that I can follow up.I also gave the patient an epp application to fill out and return to me.

## 2011-10-01 NOTE — Progress Notes (Signed)
INR = 1.6 (goal = 2-2.2) on 5 mg/day Pt thinks she dropped one of her tablets on the floor one evening & took 1 tablet (2.5 mg) instead of 2 tablets (5 mg). No s/sxs of VTE. INR subtherapeutic.  Likely due to partial dosing one evening. I will increase her dose today only to 7.5 mg then back to 5 mg/day. Return in 2 weeks. Budd Palmer, Pharm.D.

## 2011-10-01 NOTE — Patient Instructions (Signed)
Fusion Plus iron 1 daily on empty stomach with OJ

## 2011-10-01 NOTE — Progress Notes (Signed)
OFFICE PROGRESS NOTE Date of Visit 10-01-11 Physicians: M.Croitoru, R.Moriera  INTERVAL HISTORY:  Patient is seen, alone for visit, with recent lengthy hospitalization by cardiology and ongoing anticoagulation with coumadin, managed now by [facility] coumadin clinic.I met her in consultation during that hospitalization, which was from 08-13-11 thru 09-04-11. She has a history of  pulmonary emboli on ~ 3 occasions, had retinal bleed apparently when on coumadin previously, and has IVC filter in (09-2009). She had been off coumadin from time of the retinal bleed until this was resumed prior to DC from hospital. She had mild thrombocytopenia during the recent hospitalization, HIT negative and not significantly different than counts back to at least 2011. She had new bilateral LE DVTs later in the hospitalization, was not a candidate for thrombolytics or thrombectomy, was treated with heparin and converted to coumadin. Goal INR with this history is 2.0 - 2.2. She has had no problems with anticoagulation now, has had gradual improvement in LE swelling and pain, and is to see ophthalmologist on 10-06-11.   Patient has multiple other significant co morbidities, including morbid obesity, HTN/ LBBB/ sinus bradycardia, pulmonary HTN, diabetes on insulin, stage IV chronic renal insufficiency,and anemia related to CRF/chronic disease/ bleeding from rectal fissure in hospital. She is followed closely by PCP Murvin Donning. She had sleep study done last week, has not heard results.  Patient has been gradually improving since DC home; she lives alone and does not have any home agencies following regularly. She has had no noted bleeding including with BMs and vision is at baseline. LE are less swollen and much less painful; she is wrapping with gentle ace bandages only occasionally, does notice increased swelling when she is up and will try these more regularly. She does not have teds. She is very fatigued with activity, taking one day to  clean one bathroom then resting for the entire next day, but is motivated to improve.  On Review of Systems otherwise: no fever or symptoms of infection. No chest pain. No nausea or vomiting. Bowels ok. Remainder of 10 point Review of Systems negative/ unchanged.  Objective:  Vital signs in last 24 hours:  BP 155/69  Pulse 57  Temp 97.6 F (36.4 C) (Oral)  Ht 5\' 3"  (1.6 m)  Wt 282 lb (127.914 kg)  BMI 49.95 kg/m2 Ambulatory with cane, does not want to use WC. Respirations not labored RA. Very pleasant, good historian  HEENT:mucous membranes moist, pharynx normal without lesions. PERRL, not icteric LymphaticsCervical, supraclavicular, and axillary nodes normal. Resp: clear to auscultation bilaterally Cardio: regular rate and rhythm GI: soft, non-tender; bowel sounds normal; no masses,  no organomegaly Obese, nothing palpable Extremities: 2+ bilateral LE edema to thighs, but no longer tight and less tender Neuro:grossly nonfocal Skin without rash or petechiae  Lab Results:  From 09-04-11   WBC 8.8, Hgb 9.4, plt 208  From [facility] 09-08-11   WBC 7.5, Hgb 8.6, plt 224   From [facility] from 09-19-11 WBC 7.3, Hgb 9.8, plt 195 and creatinine 2.15. She is scheduled for repeat labs at [facility] in 2 wks.  INR today 1.6 likely due to error in taking her coumadin this week, to increase dose to 7.5 mg x1 then continue 5 mg daily. Recheck 2 wks per coumadin clinic.   Studies/Results:  No results found.  Medications: I have reviewed the patient's current medications. I have given her samples of Fusion Plus oral iron to begin one daily on empty stomach with OJ (as she  already takes AM meds).  Assessment/Plan: 1.History of several episodes of PEs, IVC filter in, history of retinal bleed ?while on coumadin, symptomatic bilateral LE DVTs during recent hospitalization now back on coumadin. Appreciate coumadin clinic assistance, especially as this office is very convenient location for  her. I will see her at any time if needed, otherwise I will plan to check on her in Nov. 2.multifactorial anemia, as above. Iron studies in hospital had serum iron 77, %sat 21 and ferritin 34. 3.intermittent mild thrombocytopenia since at least 2011, HIT negative. 4.morbid obesity 5.cardiac problems as above 6.diabetes 7.pulmonary HTN Patient was comfortable with discussion and plan   Torion Hulgan P, MD   10/01/2011, 5:31 PM

## 2011-10-02 ENCOUNTER — Telehealth: Payer: Self-pay | Admitting: Oncology

## 2011-10-02 NOTE — Telephone Encounter (Signed)
Talked to pt, gave her appt for November 2013 for lab and Md

## 2011-10-15 ENCOUNTER — Ambulatory Visit (HOSPITAL_BASED_OUTPATIENT_CLINIC_OR_DEPARTMENT_OTHER): Payer: Self-pay | Admitting: Pharmacist

## 2011-10-15 ENCOUNTER — Encounter (HOSPITAL_COMMUNITY): Payer: Self-pay | Admitting: *Deleted

## 2011-10-15 ENCOUNTER — Ambulatory Visit (HOSPITAL_COMMUNITY)
Admission: RE | Admit: 2011-10-15 | Discharge: 2011-10-15 | Disposition: A | Discharge status: Home / Self Care | Payer: Medicaid Other | Source: Ambulatory Visit | Attending: Oncology | Admitting: Oncology

## 2011-10-15 ENCOUNTER — Ambulatory Visit (HOSPITAL_BASED_OUTPATIENT_CLINIC_OR_DEPARTMENT_OTHER): Payer: Self-pay | Admitting: Oncology

## 2011-10-15 ENCOUNTER — Other Ambulatory Visit: Payer: Self-pay

## 2011-10-15 ENCOUNTER — Emergency Department (HOSPITAL_COMMUNITY)
Admission: EM | Admit: 2011-10-15 | Discharge: 2011-10-15 | Disposition: A | Discharge status: Home / Self Care | Payer: Medicaid Other | Attending: Emergency Medicine | Admitting: Emergency Medicine

## 2011-10-15 ENCOUNTER — Encounter: Payer: Self-pay | Admitting: Oncology

## 2011-10-15 VITALS — BP 160/71 | HR 48 | Temp 97.0°F | Ht 63.0 in | Wt 282.6 lb

## 2011-10-15 DIAGNOSIS — I82409 Acute embolism and thrombosis of unspecified deep veins of unspecified lower extremity: Secondary | ICD-10-CM

## 2011-10-15 DIAGNOSIS — Z86718 Personal history of other venous thrombosis and embolism: Secondary | ICD-10-CM | POA: Insufficient documentation

## 2011-10-15 DIAGNOSIS — Z7901 Long term (current) use of anticoagulants: Secondary | ICD-10-CM

## 2011-10-15 DIAGNOSIS — M79609 Pain in unspecified limb: Secondary | ICD-10-CM | POA: Insufficient documentation

## 2011-10-15 DIAGNOSIS — E119 Type 2 diabetes mellitus without complications: Secondary | ICD-10-CM | POA: Insufficient documentation

## 2011-10-15 DIAGNOSIS — Z86711 Personal history of pulmonary embolism: Secondary | ICD-10-CM | POA: Insufficient documentation

## 2011-10-15 DIAGNOSIS — Z79899 Other long term (current) drug therapy: Secondary | ICD-10-CM | POA: Insufficient documentation

## 2011-10-15 DIAGNOSIS — M7989 Other specified soft tissue disorders: Secondary | ICD-10-CM

## 2011-10-15 DIAGNOSIS — L02619 Cutaneous abscess of unspecified foot: Secondary | ICD-10-CM | POA: Insufficient documentation

## 2011-10-15 DIAGNOSIS — M79673 Pain in unspecified foot: Secondary | ICD-10-CM

## 2011-10-15 DIAGNOSIS — L02612 Cutaneous abscess of left foot: Secondary | ICD-10-CM

## 2011-10-15 DIAGNOSIS — I509 Heart failure, unspecified: Secondary | ICD-10-CM | POA: Insufficient documentation

## 2011-10-15 DIAGNOSIS — M129 Arthropathy, unspecified: Secondary | ICD-10-CM | POA: Insufficient documentation

## 2011-10-15 DIAGNOSIS — I10 Essential (primary) hypertension: Secondary | ICD-10-CM | POA: Insufficient documentation

## 2011-10-15 LAB — POCT INR: INR: 1.5

## 2011-10-15 LAB — PROTIME-INR: INR: 1.5 — ABNORMAL LOW (ref 2.00–3.50)

## 2011-10-15 MED ORDER — OXYCODONE-ACETAMINOPHEN 5-325 MG PO TABS
1.0000 | ORAL_TABLET | Freq: Once | ORAL | Status: AC
  Administered 2011-10-15: 1 via ORAL
  Filled 2011-10-15: qty 1

## 2011-10-15 NOTE — ED Provider Notes (Signed)
Patient on Coumadin, states she started having painful swelling in her left foot this morning. Patient has history of recent DVT and has [place] filter.  Patient's noted to have a round red swollen area near the proximal MTP of her left little toe. There is some redness surrounding it. It feels soft and fluctuant.  X-rays were reviewed and the swelling was visible on x-ray and is not bony. I have discussed with PA Rona Ravens about sticking a needle in the area to see if it's blood or pus. Patient had INR done today somewhere else that was 1.5.  Medical screening examination/treatment/procedure(s) were conducted as a shared visit with non-physician practitioner(s) and myself.  I personally evaluated the patient during the encounter  Rolland Porter, MD, Alanson Aly, MD 10/15/11 [phone]

## 2011-10-15 NOTE — Progress Notes (Signed)
Medical Oncology 10-15-11   Patient seen as work in today from [facility], with 5 day history of painful swelling lateral left foot, without trauma. Pain was severe at onset 10-11-11, then a little better, but much worse again today. She has no history of gout, tho has had similar less severe symptoms in right foot some time ago. She has had no fever or other symptoms of infection. She has slightly more swelling in left lower leg since this problem, no bleeding, INR not quite therapeutic today. PCP is not available this week, tho he is covered by another MD in town. I have spoken to that office now.  Significant medical problems include renal insufficiency, coagulopathy with IVC filter in and back on coumadin since LE DVT below IVC filter during recent hospitalization, diabetes, UTI in hospital, morbid obesity, cardiac disease.  She has felt fairly well other than extremely painful foot. No fever/ chills. No increased SOB. Some increased swelling upper thighs also. No chest pain.  Temp 97, 160/71, 48 regular, 20. Alert, good historian, crying from pain after Xray. Lungs clear. Cor slow rate, regular. Abd soft, NT. Left foot swollen with primary area lateral mid foot ~ 2.5 x 3 cm protuberant area extremely tender, erythematous. Lower left leg with 1-2+ edema, moreso than right lower leg.   Xray of foot shows no bony abnormality.  Assessment/ Plan 1.acute left foot pain x 5 days in complicated patient, possibly gout or infection. No recent chemistries in EMR, not clear if renal function/ cardiac could tolerate NSAID for possible gout. Patient in such discomfort that she will go to ED for further evaluation and intervention 2.coagulopathy: hx PEs, IVC filter in after retinal bleed, now back on coumadin since acute DVT LE in hospital in May. Followed in coumadin clinic [place], trying for low therapeutic INR 3.multiple other medical problems as above  Evlyn Clines, MD

## 2011-10-15 NOTE — ED Notes (Signed)
Pt in c/o left foot pain and swelling, redness noted, tender to touch, history of recent DVT, no known injury

## 2011-10-15 NOTE — Patient Instructions (Signed)
Coumadin dosing as instructed by coumadin clinic  To ED now for evaluation and treatment of foot

## 2011-10-15 NOTE — ED Provider Notes (Signed)
History     CSN: [id number]  Arrival date & time 10/15/11  1527   First MD Initiated Contact with Patient 10/15/11 1533      No chief complaint on file.   (Consider location/radiation/quality/duration/timing/severity/associated sxs/prior treatment) HPI  56 year old female with history of diabetes, arthritis, and DVT presents complaining of left foot pain.  Pt report swelling and pain to her L foot for the past 4 days.  Onset gradual, persistent, worsening with walking and nothing seems to make it better.  Pain increase with palpation.  Pt notice redness and swelling to the lateral aspect of her foot.  Denies fever, cp, sob, doe, chills, rash, joint pain.  Denies recent trauma.  Has had hx of DVT and currently on warfarin.  Pt f/u with her doctor today for evaluation, her INR today is 1.5.  Pt was recommended to come to ER for further evaluation.    Past Medical History  Diagnosis Date  . CHF (congestive heart failure)   . Diabetes mellitus   . Arthritis   . Hypertension   . Depression   . DVT (deep venous thrombosis) 10 years ago  . PE (pulmonary thromboembolism) 3 years ag0    Past Surgical History  Procedure Date  . Eye surgery   . Cholecystectomy   . Cesarean section   . Ivc     ivc filter    No family history on file.  History  Substance Use Topics  . Smoking status: Passive Smoker  . Smokeless tobacco: Not on file  . Alcohol Use: No    OB History    Grav Para Term Preterm Abortions TAB SAB Ect Mult Living                  Review of Systems  Constitutional: Negative for fever.  Respiratory: Negative for shortness of breath.   Cardiovascular: Negative for chest pain.  Musculoskeletal: Positive for arthralgias.  Skin: Negative for rash.  Neurological: Negative for numbness.  All other systems reviewed and are negative.    Allergies  Morphine and related  Home Medications   Current Outpatient Rx  Name Route Sig Dispense Refill  . ASPIRIN EC 81 MG  PO TBEC Oral Take 81 mg by mouth daily.    Marland Kitchen CITALOPRAM HYDROBROMIDE 20 MG PO TABS Oral Take 20 mg by mouth at bedtime.    Marland Kitchen DIAZEPAM 5 MG PO TABS Oral Take 5 mg by mouth at bedtime as needed. For muscle spasms.    . FUROSEMIDE 80 MG PO TABS Oral Take 1 tablet (80 mg total) by mouth daily. 30 tablet 11  . HYDROCODONE-ACETAMINOPHEN 5-500 MG PO TABS Oral Take 1 tablet by mouth every 8 (eight) hours as needed. For pain.    . INSULIN GLARGINE 100 UNIT/ML [place] SOLN Subcutaneous Inject 30 Units into the skin See admin instructions. She uses a sliding scale.    . INSULIN GLULISINE 100 UNIT/ML [place] SOLN Subcutaneous Inject 5-10 Units into the skin 3 (three) times daily before meals. 5 units with each meal and increase as needed by 2 units unit your 2 hour post meal blood sugars are less than 140. Per Dr [name] 09/10/11    . LEVOTHYROXINE SODIUM 25 MCG PO TABS Oral Take 25 mcg by mouth daily.    Marland Kitchen METOLAZONE 2.5 MG PO TABS  Take one tablet every Monday/Wednesday?Friday. Take 30 minutes prior to taking Furosemide 30 tablet 5  . METOPROLOL TARTRATE 50 MG PO TABS Oral Take 50 mg  by mouth 2 (two) times daily.    . ADULT MULTIVITAMIN W/MINERALS CH Oral Take 1 tablet by mouth daily.    Marland Kitchen FISH OIL 1200 MG PO CAPS Oral Take 1 capsule by mouth 2 (two) times daily.    Marland Kitchen OVER THE COUNTER MEDICATION Oral Take 2 tablets by mouth daily. Cherry 1000mg  tablets.    Marland Kitchen PANTOPRAZOLE SODIUM 40 MG PO TBEC Oral Take 1 tablet (40 mg total) by mouth daily at 12 noon. 30 tablet 11  . WARFARIN SODIUM 2.5 MG PO TABS Oral Take 5 mg by mouth daily.      There were no vitals taken for this visit.  Physical Exam  Nursing note and vitals reviewed. Constitutional: She appears well-developed and well-nourished. No distress.  HENT:  Head: Atraumatic.  Eyes: Conjunctivae are normal.  Neck: Neck supple.  Musculoskeletal:       L foot: area of induration and fluctuance to lateral aspect of foot overlying the 5th MTP.  Tenderness on  palpation.  Pedal pulse palpable.   L leg: no palpable cord, erythema, negative Homan's sign.  No pedal edema  Neurological: She is alert.  Skin: Skin is warm.    ED Course  Procedures (including critical care time)  Labs Reviewed - No data to display Dg Foot 2 Views Left  10/15/2011  *RADIOLOGY REPORT*  Clinical Data: Swelling and pain.  Fifth metatarsal pain.  LEFT FOOT - 2 VIEW  Comparison: None.  Findings: Two-view study shows no fracture.  No subluxation or dislocation.  No bony changes to suggest gallops.  No substantial degenerative change in the MTP joint of the great toe.  IMPRESSION: No bony findings to explain the patient's history of pain.  Original Report Authenticated By: ERIC A. MANSELL, M.D.     No diagnosis found.  [name], [name]  Female  [birth year]  [phone]            Progress Notes signed by Nani Ravens at 10/15/11 1639     Author:  Nani Ravens  Service:  Vascular Lab  Author Type:  Cardiovascular Sonographer   Filed:  10/15/11 1639  Note Time:  10/15/11 1630      Related Notes:  Original Note by: Nani Ravens filed at 10/15/11 1631       VASCULAR LAB  PRELIMINARY PRELIMINARY PRELIMINARY PRELIMINARY  Left lower extremity venous duplex completed.  Preliminary report: Left: No obvious evidence of acute DVT or superficial thrombosis. Evidence of previously noted DVT with some reconstitution noted. Technically limited by body habitus.  CESTONE, HELENE, RVT  10/15/2011, 4:30 PM   INCISION AND DRAINAGE Performed by: Domenic Moras Consent: Verbal consent obtained. Risks and benefits: risks, benefits and alternatives were discussed Type: abscess  Body area: L foot  Anesthesia: local infiltration  Local anesthetic: lidocaine 2% w epinephrine  Anesthetic total: 8 ml  Complexity: complex Blunt dissection to break up loculations  Drainage: purulent  Drainage amount: moderate  Packing material: 1/4 in iodoform gauze  Patient  tolerance: Patient tolerated the procedure well with no immediate complications.     MDM  Hx DVT.  Xray and vascular study ordered.  7:03 PM   Abscess of L foot.  Successful I&D.  Care instruction including clean with soap, warm compress and taking abx given.  Pt to f/u with PCP or return in 2 days to wound recheck and packing removal.  Pt voice understanding and agrees with plan.          [name]  Rona Ravens, PA-C 10/15/11 1903

## 2011-10-15 NOTE — Patient Instructions (Addendum)
Increase Coumadin to 5mg  daily except 7.5mg  on Wednesdays and Saturdays. Recheck INR in 10 days on 10/24/11. Call us if you start any new prescriptions related to the knot/pain with your foot.

## 2011-10-15 NOTE — ED Provider Notes (Signed)
See prior note   Janice Norrie, MD 10/15/11 [phone]

## 2011-10-15 NOTE — Progress Notes (Addendum)
VASCULAR LAB PRELIMINARY  PRELIMINARY  PRELIMINARY  PRELIMINARY  Left lower extremity venous duplex completed.    Preliminary report:  Left:  No obvious evidence of acute DVT or superficial thrombosis.  Evidence of previously noted DVT with some reconstitution noted. Technically limited by body habitus.  Suzana Sohail, RVT 10/15/2011, 4:30 PM

## 2011-10-15 NOTE — Progress Notes (Signed)
Increase Coumadin to 5mg  daily except 7.5mg  on Wednesdays and Saturdays.  Recheck INR in 10 days on 10/24/11. Patient knows to call us if she starts any new prescriptions related to the knot or pain with her foot.

## 2011-10-24 ENCOUNTER — Ambulatory Visit: Payer: Self-pay

## 2011-10-24 ENCOUNTER — Telehealth: Payer: Self-pay | Admitting: Pharmacist

## 2011-10-24 ENCOUNTER — Other Ambulatory Visit: Payer: Self-pay | Admitting: Lab

## 2011-10-24 NOTE — Telephone Encounter (Signed)
Pt called and said she has a sore throat and her foot is bothering her today (on going issue from 10/15/11 office visit). She would like to reschedule her lab and coumadin clinic appointment to 10/28/11 at 1pm. Note sent to schedulers.

## 2011-10-28 ENCOUNTER — Other Ambulatory Visit (HOSPITAL_BASED_OUTPATIENT_CLINIC_OR_DEPARTMENT_OTHER): Payer: Self-pay | Admitting: Lab

## 2011-10-28 ENCOUNTER — Ambulatory Visit (HOSPITAL_BASED_OUTPATIENT_CLINIC_OR_DEPARTMENT_OTHER): Payer: Self-pay | Admitting: Pharmacist

## 2011-10-28 DIAGNOSIS — I82409 Acute embolism and thrombosis of unspecified deep veins of unspecified lower extremity: Secondary | ICD-10-CM

## 2011-10-28 LAB — POCT INR: INR: 2.1

## 2011-10-28 LAB — PROTIME-INR: Protime: 25.2 Seconds — ABNORMAL HIGH (ref 10.6–13.4)

## 2011-10-28 NOTE — Patient Instructions (Signed)
Pt is in her very narrow therapeutic range of 2-2.2 at 2.1.  She is taking 5 mg daily.  We will continue this dose.  Her last visit she was under the impression that the wed and Sat dose was just for that week and did not continue it the following week.  She continues to have pain from the incision on the wound of her left foot that they had to drain at the ER a few weeks ago.  She states they did not put her on any abx because of the coumadin.  She has been putting neosporin on it and has seen improvement.  We will continue the 5mg  daily and RTC on 11/07/11 at 10 am.

## 2011-10-28 NOTE — Progress Notes (Signed)
Pt is in her very narrow therapeutic range of 2-2.2 at 2.1.  She is taking 5 mg daily.  We will continue this dose.  Her last visit she was under the impression that the wed and Sat dose was just for that week and did not continue it the following week. She continues to have pain from the incision on the wound of her left foot that they had to drain at the ER a few weeks ago.  She states they did not put her on any abx because of the coumadin.  She has been putting neosporin on it and has seen improvement.  We will continue the 5mg  daily and RTC on 11/07/11 at 10 am.

## 2011-11-07 ENCOUNTER — Other Ambulatory Visit (HOSPITAL_BASED_OUTPATIENT_CLINIC_OR_DEPARTMENT_OTHER): Payer: Self-pay | Admitting: Lab

## 2011-11-07 ENCOUNTER — Ambulatory Visit (HOSPITAL_BASED_OUTPATIENT_CLINIC_OR_DEPARTMENT_OTHER): Payer: Self-pay | Admitting: Pharmacist

## 2011-11-07 DIAGNOSIS — I82409 Acute embolism and thrombosis of unspecified deep veins of unspecified lower extremity: Secondary | ICD-10-CM

## 2011-11-07 LAB — POCT INR: INR: 1.9

## 2011-11-07 NOTE — Progress Notes (Signed)
Patient returns for follow up coumadin visit. INR is slightly subtherapeutic at 1.9. Pt on keflex 100 mg tid x 10 days (currently day 6). WIll not make adjustments at this time. Patient has appointment with wound clinic on Tuesday. Plan: Continue Coumadin 5mg  daily. Recheck INR in 10 days on 11/17/11 at 10 am.

## 2011-11-11 ENCOUNTER — Encounter (HOSPITAL_BASED_OUTPATIENT_CLINIC_OR_DEPARTMENT_OTHER): Payer: Medicaid Other | Attending: General Surgery

## 2011-11-11 DIAGNOSIS — E119 Type 2 diabetes mellitus without complications: Secondary | ICD-10-CM | POA: Insufficient documentation

## 2011-11-11 DIAGNOSIS — I10 Essential (primary) hypertension: Secondary | ICD-10-CM | POA: Insufficient documentation

## 2011-11-11 DIAGNOSIS — Z7901 Long term (current) use of anticoagulants: Secondary | ICD-10-CM | POA: Insufficient documentation

## 2011-11-11 DIAGNOSIS — Z7982 Long term (current) use of aspirin: Secondary | ICD-10-CM | POA: Insufficient documentation

## 2011-11-11 DIAGNOSIS — L03119 Cellulitis of unspecified part of limb: Secondary | ICD-10-CM | POA: Insufficient documentation

## 2011-11-11 DIAGNOSIS — Z8672 Personal history of thrombophlebitis: Secondary | ICD-10-CM | POA: Insufficient documentation

## 2011-11-11 DIAGNOSIS — I872 Venous insufficiency (chronic) (peripheral): Secondary | ICD-10-CM | POA: Insufficient documentation

## 2011-11-11 DIAGNOSIS — Z794 Long term (current) use of insulin: Secondary | ICD-10-CM | POA: Insufficient documentation

## 2011-11-11 DIAGNOSIS — L02619 Cutaneous abscess of unspecified foot: Secondary | ICD-10-CM | POA: Insufficient documentation

## 2011-11-11 DIAGNOSIS — Z79899 Other long term (current) drug therapy: Secondary | ICD-10-CM | POA: Insufficient documentation

## 2011-11-11 NOTE — Progress Notes (Signed)
[facility]  NAME:  [name], [name]              ACCOUNT NO.:  [id number]  MEDICAL RECORD NO.:  [id number]      DATE OF BIRTH:  [birth year]  PHYSICIAN:  Judene Companion, M.D.           VISIT DATE:                                  OFFICE VISIT   This is a morbidly obese 56 year old lady who has had many problems including diabetes, obesity, hypertension, venous stasis.  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  She is on many medicines including aspirin, Valium, Lasix, Lantus insulin, levothyroxine, metoprolol, Keflex, Coumadin and insulin. She today was found to be afebrile with a blood pressure of 168/80, respirations 18, temperature 98.6.  She weighs 268 pounds and is 5 feet 3 inches tall.  She comes back here because of a cellulitis and a small abscess on her lateral left foot.  It is about a centimeter in diameter and she has a cellulitis around this lateral part of her foot.  I did an I and D and got a culture and put in an Iodoform gauze.  I start her on doxycycline and I will have her come back in a day and then remove the drain and then we will probably use some sort of alginate on the wound. I think she could even probably use some compression when she comes back.  So this is a lady with many many problems and on many medicines, but I left her all of her regular medicine including the Coumadin and I just started her on doxycycline and we will check her culture.     Judene Companion, M.D.     PP/MEDQ  D:  11/11/2011  T:  11/11/2011  Job:  [id number]

## 2011-11-13 ENCOUNTER — Other Ambulatory Visit: Payer: Self-pay | Admitting: Lab

## 2011-11-13 ENCOUNTER — Ambulatory Visit: Payer: Self-pay

## 2011-11-17 ENCOUNTER — Other Ambulatory Visit (HOSPITAL_BASED_OUTPATIENT_CLINIC_OR_DEPARTMENT_OTHER): Payer: Medicaid Other | Admitting: Lab

## 2011-11-17 ENCOUNTER — Ambulatory Visit (HOSPITAL_BASED_OUTPATIENT_CLINIC_OR_DEPARTMENT_OTHER): Payer: Medicaid Other | Admitting: Pharmacist

## 2011-11-17 DIAGNOSIS — I82409 Acute embolism and thrombosis of unspecified deep veins of unspecified lower extremity: Secondary | ICD-10-CM

## 2011-11-17 LAB — PROTIME-INR
INR: 1.6 — ABNORMAL LOW (ref 2.00–3.50)
Protime: 19.2 Seconds — ABNORMAL HIGH (ref 10.6–13.4)

## 2011-11-17 LAB — POCT INR: INR: 1.6

## 2011-11-17 NOTE — Progress Notes (Signed)
Patient is on day 6 of 14 of Doxycycline for her foot/wound infection. Will increase Coumadin slightly to 5mg  daily except 7.5mg  on Mondays.  Doxycycline may also increase her INR slightly. Recheck INR in 10 days on 11/26/11 at 10:30 am for lab and 10:45am for Coumadin Clinic. Pt will be nearly finished with her course of Doxycycline.

## 2011-11-17 NOTE — Patient Instructions (Signed)
Increase Coumadin slightly to 5mg  daily except 7.5mg  on Mondays.  Recheck INR in 10 days on 11/26/11 at 10:30 am for lab and 10:45am for Coumadin Clinic.

## 2011-11-26 ENCOUNTER — Other Ambulatory Visit (HOSPITAL_BASED_OUTPATIENT_CLINIC_OR_DEPARTMENT_OTHER): Payer: Medicaid Other | Admitting: Lab

## 2011-11-26 ENCOUNTER — Ambulatory Visit (HOSPITAL_BASED_OUTPATIENT_CLINIC_OR_DEPARTMENT_OTHER): Payer: Medicaid Other | Admitting: Pharmacist

## 2011-11-26 DIAGNOSIS — I82409 Acute embolism and thrombosis of unspecified deep veins of unspecified lower extremity: Secondary | ICD-10-CM

## 2011-11-26 DIAGNOSIS — Z7901 Long term (current) use of anticoagulants: Secondary | ICD-10-CM

## 2011-11-26 NOTE — Progress Notes (Signed)
Pt close to goal INR of 2-2.2, but still subtherapeutic.  Will complete doxycycline tomorrow.  Pt stated that foot abcess is healed.  Will slightly increase coumadin dose to 7.5mg  on M/F and 5mg  other days.  Will check PT/INR next week.

## 2011-12-05 ENCOUNTER — Ambulatory Visit (HOSPITAL_BASED_OUTPATIENT_CLINIC_OR_DEPARTMENT_OTHER): Payer: Medicaid Other | Admitting: Pharmacist

## 2011-12-05 ENCOUNTER — Other Ambulatory Visit (HOSPITAL_BASED_OUTPATIENT_CLINIC_OR_DEPARTMENT_OTHER): Payer: Medicaid Other | Admitting: Lab

## 2011-12-05 DIAGNOSIS — I82409 Acute embolism and thrombosis of unspecified deep veins of unspecified lower extremity: Secondary | ICD-10-CM

## 2011-12-05 LAB — POCT INR: INR: 1.6

## 2011-12-05 LAB — PROTIME-INR

## 2011-12-05 NOTE — Progress Notes (Signed)
Unsure why INR still below goal of 2-2.2.  No medication changes and pt has completed doxycycline.  Will increase dose by approximately 15% to 7.5mg  M/W/F/Sa and 5mg  T/Th/Sa.  Will check PT/INR in 1 week.

## 2011-12-09 ENCOUNTER — Encounter (HOSPITAL_BASED_OUTPATIENT_CLINIC_OR_DEPARTMENT_OTHER): Payer: Medicaid Other | Attending: General Surgery

## 2011-12-12 ENCOUNTER — Ambulatory Visit (HOSPITAL_BASED_OUTPATIENT_CLINIC_OR_DEPARTMENT_OTHER): Payer: Medicaid Other | Admitting: Pharmacist

## 2011-12-12 ENCOUNTER — Other Ambulatory Visit (HOSPITAL_BASED_OUTPATIENT_CLINIC_OR_DEPARTMENT_OTHER): Payer: Medicaid Other | Admitting: Lab

## 2011-12-12 DIAGNOSIS — I82409 Acute embolism and thrombosis of unspecified deep veins of unspecified lower extremity: Secondary | ICD-10-CM

## 2011-12-12 LAB — PROTIME-INR
INR: 2 (ref 2.00–3.50)
Protime: 24 Seconds — ABNORMAL HIGH (ref 10.6–13.4)

## 2011-12-12 NOTE — Progress Notes (Signed)
INR therapeutic today (2.0) on 7.5 mg daily except 5mg  on Su, Tu, Th. No changes in meds or diet.  No problems. Continue current dose of 7.5mg  daily except 5mg  on SuTuTh.  Recheck INR in ~ 10 days to ensure she stays in therapeutic range.

## 2011-12-22 ENCOUNTER — Ambulatory Visit (HOSPITAL_BASED_OUTPATIENT_CLINIC_OR_DEPARTMENT_OTHER): Payer: Medicaid Other | Admitting: Pharmacist

## 2011-12-22 ENCOUNTER — Other Ambulatory Visit (HOSPITAL_BASED_OUTPATIENT_CLINIC_OR_DEPARTMENT_OTHER): Payer: Medicaid Other | Admitting: Lab

## 2011-12-22 DIAGNOSIS — I82409 Acute embolism and thrombosis of unspecified deep veins of unspecified lower extremity: Secondary | ICD-10-CM

## 2011-12-22 NOTE — Progress Notes (Signed)
INR slightly supratherapeutic (2.3) No significant bleeding or bruising. Pt says she bruises all the time, but none are larger than the palm of her hand or even a half dollar.  Started new medications: a dissolving Potassium tablet 19mEq daily, and Fusion Plus iron supplementation.  [place] updated.  No other problems. Will continue current dose of 7.5mg  daily except 5mg  on TuThSa. Recheck INR in 2 weeks.

## 2012-01-05 ENCOUNTER — Other Ambulatory Visit (HOSPITAL_BASED_OUTPATIENT_CLINIC_OR_DEPARTMENT_OTHER): Payer: Medicaid Other | Admitting: Lab

## 2012-01-05 ENCOUNTER — Ambulatory Visit (HOSPITAL_BASED_OUTPATIENT_CLINIC_OR_DEPARTMENT_OTHER): Payer: Medicaid Other | Admitting: Pharmacist

## 2012-01-05 DIAGNOSIS — I82409 Acute embolism and thrombosis of unspecified deep veins of unspecified lower extremity: Secondary | ICD-10-CM

## 2012-01-05 LAB — PROTIME-INR: Protime: 22.8 Seconds — ABNORMAL HIGH (ref 10.6–13.4)

## 2012-01-05 NOTE — Progress Notes (Signed)
No changes with pt.  INR close to goal of 2-2.2.  Pt has been stable on current coumadin dose for 1 month.  Will increase interval of checking PT/INR and will see pt back in coumadin clinic in 3-4 weeks.

## 2012-01-25 ENCOUNTER — Emergency Department (HOSPITAL_COMMUNITY): Payer: Medicaid Other

## 2012-01-25 ENCOUNTER — Emergency Department (HOSPITAL_COMMUNITY)
Admission: EM | Admit: 2012-01-25 | Discharge: 2012-01-26 | Disposition: A | Discharge status: Home / Self Care | Payer: Medicaid Other | Attending: Emergency Medicine | Admitting: Emergency Medicine

## 2012-01-25 ENCOUNTER — Encounter (HOSPITAL_COMMUNITY): Payer: Self-pay | Admitting: Emergency Medicine

## 2012-01-25 DIAGNOSIS — Z794 Long term (current) use of insulin: Secondary | ICD-10-CM | POA: Insufficient documentation

## 2012-01-25 DIAGNOSIS — Z9089 Acquired absence of other organs: Secondary | ICD-10-CM | POA: Insufficient documentation

## 2012-01-25 DIAGNOSIS — E119 Type 2 diabetes mellitus without complications: Secondary | ICD-10-CM | POA: Insufficient documentation

## 2012-01-25 DIAGNOSIS — I10 Essential (primary) hypertension: Secondary | ICD-10-CM | POA: Insufficient documentation

## 2012-01-25 DIAGNOSIS — Z7982 Long term (current) use of aspirin: Secondary | ICD-10-CM | POA: Insufficient documentation

## 2012-01-25 DIAGNOSIS — Z86718 Personal history of other venous thrombosis and embolism: Secondary | ICD-10-CM | POA: Insufficient documentation

## 2012-01-25 DIAGNOSIS — M549 Dorsalgia, unspecified: Secondary | ICD-10-CM | POA: Insufficient documentation

## 2012-01-25 DIAGNOSIS — Z79899 Other long term (current) drug therapy: Secondary | ICD-10-CM | POA: Insufficient documentation

## 2012-01-25 DIAGNOSIS — R079 Chest pain, unspecified: Secondary | ICD-10-CM | POA: Insufficient documentation

## 2012-01-25 DIAGNOSIS — I509 Heart failure, unspecified: Secondary | ICD-10-CM | POA: Insufficient documentation

## 2012-01-25 DIAGNOSIS — M7989 Other specified soft tissue disorders: Secondary | ICD-10-CM | POA: Insufficient documentation

## 2012-01-25 DIAGNOSIS — Z7901 Long term (current) use of anticoagulants: Secondary | ICD-10-CM | POA: Insufficient documentation

## 2012-01-25 LAB — BASIC METABOLIC PANEL
CO2: 30 mEq/L (ref 19–32)
Chloride: 94 mEq/L — ABNORMAL LOW (ref 96–112)
Glucose, Bld: 145 mg/dL — ABNORMAL HIGH (ref 70–99)
Sodium: 135 mEq/L (ref 135–145)

## 2012-01-25 LAB — CBC
HCT: 29.7 % — ABNORMAL LOW (ref 36.0–46.0)
MCV: 88.1 fL (ref 78.0–100.0)
RDW: 16.4 % — ABNORMAL HIGH (ref 11.5–15.5)
WBC: 7.3 10*3/uL (ref 4.0–10.5)

## 2012-01-25 LAB — PRO B NATRIURETIC PEPTIDE: Pro B Natriuretic peptide (BNP): 684.9 pg/mL — ABNORMAL HIGH (ref 0–125)

## 2012-01-25 LAB — PROTIME-INR: Prothrombin Time: 21.3 seconds — ABNORMAL HIGH (ref 11.6–15.2)

## 2012-01-25 MED ORDER — NITROGLYCERIN 0.4 MG SL SUBL: 0.4000 mg | SUBLINGUAL_TABLET | SUBLINGUAL | Status: DC | PRN

## 2012-01-25 NOTE — ED Provider Notes (Signed)
Medical screening examination/treatment/procedure(s) were conducted as a shared visit with non-physician practitioner(s) and myself.  I personally evaluated the patient during the encounter Pt has hx of multiple pes and chf. Has IVC filter and is on coumadin.  No hx acs.  After church felt bad. Had chills. Drank hot chocolate and got under blanket.   Had sudden onset of pain between shoulders and in chest.  No cough, fever, sweating.  Now asx.  cxr no acute illness.  Doubt acs.   No distress. Lungs cta in ed. ecg no signs acs or arrhythmia.  Barbara Cower, MD 01/25/12 [phone]

## 2012-01-25 NOTE — ED Provider Notes (Signed)
History     CSN: [id number]  Arrival date & time 01/25/12  2225   First MD Initiated Contact with Patient 01/25/12 2226      Chief Complaint  Patient presents with  . Chest Pain    (Consider location/radiation/quality/duration/timing/severity/associated sxs/prior treatment) HPI Comments: 56 y/o female with multiple medical problems presents to the ED via EMS complaining of gradual onset onset chest pain radiating under her right breast. Pain began in her pack and radiated around to the right side of her chest. She was at church around 4:00 pm today when the pain started, she went home and was due to go back to church at 6, however she did not go due to her chest pain. States she had a small "coughing bit" while at church. Describes the pain as both sharp and dull, rated 10/10. Took vicodin at home without any relief. Admits to tenderness in her mid-sternal region, worse with inspiration. She was given 1 SL nitro by EMS which decreased her pain to 8/10. Admits to associated SOB at rest. Denies nausea, vomiting, diaphoresis. States she has chills, no fever. She is on Warfarin for previous PEs. States she was moving a lot of light boxes over the weekend.  The history is provided by the patient and the EMS personnel.    Past Medical History  Diagnosis Date  . CHF (congestive heart failure)   . Diabetes mellitus   . Arthritis   . Hypertension   . Depression   . DVT (deep venous thrombosis) 10 years ago  . PE (pulmonary thromboembolism) 3 years ag0    Past Surgical History  Procedure Date  . Eye surgery   . Cholecystectomy   . Cesarean section   . Ivc     ivc filter    No family history on file.  History  Substance Use Topics  . Smoking status: Passive Smoke Exposure - Never Smoker  . Smokeless tobacco: Not on file  . Alcohol Use: No    OB History    Grav Para Term Preterm Abortions TAB SAB Ect Mult Living                  Review of Systems  Constitutional: Positive  for chills. Negative for fever and diaphoresis.  HENT: Negative for neck pain and neck stiffness.   Eyes: Negative for visual disturbance.  Respiratory: Positive for cough and shortness of breath.   Cardiovascular: Positive for chest pain and leg swelling. Negative for palpitations.  Gastrointestinal: Negative for nausea, vomiting and abdominal pain.  Musculoskeletal: Positive for back pain.  Skin: Negative for pallor and rash.  Neurological: Negative for dizziness and light-headedness.  Psychiatric/Behavioral: The patient is not nervous/anxious.     Allergies  Morphine and related  Home Medications   Current Outpatient Rx  Name Route Sig Dispense Refill  . ASPIRIN EC 81 MG PO TBEC Oral Take 81 mg by mouth daily.    Marland Kitchen CITALOPRAM HYDROBROMIDE 20 MG PO TABS Oral Take 20 mg by mouth at bedtime.    Marland Kitchen DIAZEPAM 5 MG PO TABS Oral Take 5 mg by mouth at bedtime as needed. For muscle spasms.    . FUROSEMIDE 80 MG PO TABS Oral Take 1 tablet (80 mg total) by mouth daily. 30 tablet 11  . HYDROCODONE-ACETAMINOPHEN 5-500 MG PO TABS Oral Take 1 tablet by mouth every 8 (eight) hours as needed. For pain.    . INSULIN GLARGINE 100 UNIT/ML [place] SOLN Subcutaneous Inject 30 Units into the  skin See admin instructions. She uses a sliding scale.    . INSULIN GLULISINE 100 UNIT/ML [place] SOLN Subcutaneous Inject 5-10 Units into the skin 3 (three) times daily before meals. 5 units with each meal and increase as needed by 2 units unit your 2 hour post meal blood sugars are less than 140. Per Dr [name] 09/10/11    . FUSION PLUS PO CAPS Oral Take 1 capsule by mouth daily.    Marland Kitchen LEVOTHYROXINE SODIUM 25 MCG PO TABS Oral Take 25 mcg by mouth daily.    Marland Kitchen METOLAZONE 2.5 MG PO TABS  Take one tablet every Monday/Wednesday?Friday. Take 30 minutes prior to taking Furosemide 30 tablet 5  . METOPROLOL TARTRATE 50 MG PO TABS Oral Take 50 mg by mouth 2 (two) times daily.    . ADULT MULTIVITAMIN W/MINERALS CH Oral Take 1 tablet by  mouth daily.    Marland Kitchen FISH OIL 1200 MG PO CAPS Oral Take 1 capsule by mouth 2 (two) times daily.    Marland Kitchen OVER THE COUNTER MEDICATION Oral Take 2 tablets by mouth daily. Cherry 1000mg  tablets.    Marland Kitchen PANTOPRAZOLE SODIUM 40 MG PO TBEC Oral Take 1 tablet (40 mg total) by mouth daily at 12 noon. 30 tablet 11  . POTASSIUM CHLORIDE 20 MEQ PO PACK Oral Take 20 mEq by mouth daily. Effertasium dissolving tablets 23mEq - dissolve 1 daily in 2-4 oz water    . WARFARIN SODIUM 2.5 MG PO TABS Oral Take by mouth daily. Take 5mg  daily except 7.5mg  on Wednesdays and Saturdays.      BP 122/40  Pulse 58  Temp 98.8 F (37.1 C) (Oral)  Resp 17  Ht 5\' 3"  (1.6 m)  Wt 267 lb (121.11 kg)  BMI 47.30 kg/m2  SpO2 99%  Physical Exam  Constitutional: She is oriented to person, place, and time. She appears well-developed. No distress.       Obese  HENT:  Head: Normocephalic and atraumatic.  Mouth/Throat: Oropharynx is clear and moist.  Eyes: Conjunctivae normal and EOM are normal. Pupils are equal, round, and reactive to light.  Neck: Normal range of motion. Neck supple. No JVD present.  Cardiovascular: Normal rate, regular rhythm, normal heart sounds and intact distal pulses.        +1 pitting edema lower extremities bilateral  Pulmonary/Chest: Effort normal. Not tachypneic. No respiratory distress. She has rales (lower posterior lung fields). She exhibits tenderness (mid-sternal).  Abdominal: Soft. Bowel sounds are normal. There is no tenderness.  Musculoskeletal: Normal range of motion.  Neurological: She is alert and oriented to person, place, and time.  Skin: Skin is warm and dry. No rash noted.       Chronic stasis changes noted lower extremities bilateral.  Psychiatric: Her speech is normal and behavior is normal. Her mood appears anxious.    ED Course  Procedures (including critical care time)  Labs Reviewed  CBC - Abnormal; Notable for the following:    RBC 3.37 (*)     Hemoglobin 9.3 (*)     HCT 29.7  (*)     RDW 16.4 (*)     All other components within normal limits  BASIC METABOLIC PANEL - Abnormal; Notable for the following:    Potassium 3.2 (*)     Chloride 94 (*)     Glucose, Bld 145 (*)     BUN 66 (*)     Creatinine, Ser 2.10 (*)     GFR calc non Af Amer 25 (*)  GFR calc Af Amer 29 (*)     All other components within normal limits  PRO B NATRIURETIC PEPTIDE - Abnormal; Notable for the following:    Pro B Natriuretic peptide (BNP) 684.9 (*)     All other components within normal limits  PROTIME-INR - Abnormal; Notable for the following:    Prothrombin Time 21.3 (*)     INR 1.93 (*)     All other components within normal limits   Date: 01/25/2012  Rate: 62  Rhythm: normal sinus rhythm  QRS Axis: left  Intervals: QT prolonged  ST/T Wave abnormalities: normal  Conduction Disutrbances:first-degree A-V block  and left bundle branch block  Narrative Interpretation: no stemi  Old EKG Reviewed: unchanged   Dg Chest 2 View  01/25/2012  *RADIOLOGY REPORT*  Clinical Data: Chest pain.  CHEST - 2 VIEW  Comparison: Aug 28, 2011  Findings: Cardiomediastinal silhouette appears normal.  No acute pulmonary disease is noted.  Bony thorax is intact.  IMPRESSION: No acute cardiopulmonary abnormality seen.   Original Report Authenticated By: Dalene Carrow., M.D.      1. Chest pain       MDM  56 y/o female with history of multiple medical problems presenting with chest pain and sob. Pain in ED relieved with SL nitro. EKG without any changes. Pro BNP elevated, not significantly higher than in the past. CXR without any acute cardiopulmonary abnormality. Currently she is resting comfortably in the bed. Will discharge and have her f/u with her PCP. Case discussed with Dr. Vanessa Kick who also evaluated patient and agrees with plan of care.       Illene Labrador, PA-C 01/26/12 0006

## 2012-01-25 NOTE — ED Notes (Signed)
Patient c/o chest pain that radiates to patient's mid shoulder blades.  Patient also c/o neck pain, jaw pain, and arm pain.  Hx of blood clots in lungs and legs, heart failure, and hypertension.  Patient states that she was moving small items on Friday and her pain started today.

## 2012-01-26 LAB — POCT I-STAT TROPONIN I: Troponin i, poc: 0.04 ng/mL (ref 0.00–0.08)

## 2012-01-27 NOTE — ED Provider Notes (Signed)
Medical screening examination/treatment/procedure(s) were conducted as a shared visit with non-physician practitioner(s) and myself.  I personally evaluated the patient during the encounter  Barbara Cower, MD 01/27/12 [phone]

## 2012-02-02 ENCOUNTER — Other Ambulatory Visit: Payer: Medicaid Other | Admitting: Lab

## 2012-02-02 ENCOUNTER — Ambulatory Visit: Payer: Medicaid Other

## 2012-02-02 ENCOUNTER — Telehealth: Payer: Self-pay | Admitting: Oncology

## 2012-02-02 NOTE — Telephone Encounter (Signed)
pt called and l/m that she was ill today and needed to r/s,called her back and lm for her to call

## 2012-02-03 ENCOUNTER — Telehealth: Payer: Self-pay | Admitting: Pharmacist

## 2012-02-03 NOTE — Telephone Encounter (Signed)
Pt aware that her lab appointment on 02/06/12 has been moved to 1:30pm (from 1pm). Coumadin clinic at 1:45pm. These times will work for her.

## 2012-02-06 ENCOUNTER — Telehealth: Payer: Self-pay | Admitting: Pharmacist

## 2012-02-06 ENCOUNTER — Other Ambulatory Visit: Payer: Medicaid Other | Admitting: Lab

## 2012-02-06 ENCOUNTER — Ambulatory Visit: Payer: Medicaid Other

## 2012-02-09 ENCOUNTER — Ambulatory Visit: Payer: Self-pay | Admitting: Oncology

## 2012-02-10 ENCOUNTER — Ambulatory Visit (HOSPITAL_BASED_OUTPATIENT_CLINIC_OR_DEPARTMENT_OTHER): Payer: Medicaid Other | Admitting: Pharmacist

## 2012-02-10 ENCOUNTER — Other Ambulatory Visit (HOSPITAL_BASED_OUTPATIENT_CLINIC_OR_DEPARTMENT_OTHER): Payer: Medicaid Other | Admitting: Lab

## 2012-02-10 DIAGNOSIS — I82409 Acute embolism and thrombosis of unspecified deep veins of unspecified lower extremity: Secondary | ICD-10-CM

## 2012-02-10 NOTE — Progress Notes (Signed)
INR slightly above goal. No problems regarding anticoagulation to report. Pt went to ED on 01/25/12 secondary to pain from a back spasm. INR on 01/25/12 = 1.93.  Will continue same dose of Coumadin. Continue Coumadin 7.5mg  daily except 5mg  on Tuesday, Thursday and Sunday.   Recheck INR in 2 weeks on 02/24/12 at 11:00 am for lab, 11:30am for MD apt, and 12:00pm for  Coumadin clinic.

## 2012-02-10 NOTE — Patient Instructions (Addendum)
Continue same dose of Coumadin. Continue Coumadin 7.5mg  daily except 5mg  on Tuesday, Thursday and Sunday.   Recheck INR on 02/24/12 at 11:00 am for lab, 11:30am for MD apt, and 12:00pm for  Coumadin clinic.

## 2012-02-13 MED ORDER — HYDROMORPHONE HCL PF 2 MG/ML IJ SOLN
INTRAMUSCULAR | Status: AC
  Filled 2012-02-13: qty 1

## 2012-02-24 ENCOUNTER — Other Ambulatory Visit: Payer: Self-pay | Admitting: Physician Assistant

## 2012-02-24 ENCOUNTER — Ambulatory Visit: Payer: Self-pay | Admitting: Oncology

## 2012-02-24 ENCOUNTER — Ambulatory Visit (HOSPITAL_BASED_OUTPATIENT_CLINIC_OR_DEPARTMENT_OTHER): Payer: Medicaid Other | Admitting: Physician Assistant

## 2012-02-24 ENCOUNTER — Other Ambulatory Visit (HOSPITAL_BASED_OUTPATIENT_CLINIC_OR_DEPARTMENT_OTHER): Payer: Medicaid Other | Admitting: Lab

## 2012-02-24 ENCOUNTER — Ambulatory Visit (HOSPITAL_BASED_OUTPATIENT_CLINIC_OR_DEPARTMENT_OTHER): Payer: Medicaid Other | Admitting: Pharmacist

## 2012-02-24 ENCOUNTER — Telehealth: Payer: Self-pay | Admitting: Oncology

## 2012-02-24 VITALS — BP 163/81 | HR 61 | Temp 96.8°F | Resp 20 | Ht 63.0 in | Wt 274.5 lb

## 2012-02-24 DIAGNOSIS — I2789 Other specified pulmonary heart diseases: Secondary | ICD-10-CM

## 2012-02-24 DIAGNOSIS — D649 Anemia, unspecified: Secondary | ICD-10-CM

## 2012-02-24 DIAGNOSIS — I82409 Acute embolism and thrombosis of unspecified deep veins of unspecified lower extremity: Secondary | ICD-10-CM

## 2012-02-24 DIAGNOSIS — Z86711 Personal history of pulmonary embolism: Secondary | ICD-10-CM

## 2012-02-24 DIAGNOSIS — E119 Type 2 diabetes mellitus without complications: Secondary | ICD-10-CM

## 2012-02-24 LAB — CBC & DIFF AND RETIC
Basophils Absolute: 0 10*3/uL (ref 0.0–0.1)
EOS%: 2.6 % (ref 0.0–7.0)
HCT: 31.7 % — ABNORMAL LOW (ref 34.8–46.6)
HGB: 9.8 g/dL — ABNORMAL LOW (ref 11.6–15.9)
Immature Retic Fract: 7.5 % (ref 1.60–10.00)
LYMPH%: 19 % (ref 14.0–49.7)
MCH: 27.5 pg (ref 25.1–34.0)
MCV: 89 fL (ref 79.5–101.0)
MONO%: 7.6 % (ref 0.0–14.0)
NEUT%: 70.5 % (ref 38.4–76.8)
Platelets: 162 10*3/uL (ref 145–400)

## 2012-02-24 LAB — PROTIME-INR
INR: 2.5 (ref 2.00–3.50)
Protime: 30 Seconds — ABNORMAL HIGH (ref 10.6–13.4)

## 2012-02-24 LAB — IRON AND TIBC
%SAT: 14 % — ABNORMAL LOW (ref 20–55)
TIBC: 332 ug/dL (ref 250–470)

## 2012-02-24 NOTE — Patient Instructions (Addendum)
Continue taking Coumadin and following up with the Coumadin Clinic as scheduled Follow up with Dr. Marko Plume in 1 year

## 2012-02-24 NOTE — Progress Notes (Signed)
Pt stated that she had some severe pain on back/neck and jaw and called 911 on 01/25/12.  Cardiac problems r/o at ED.  Pt wanted to know if could be associated with Coumadin.  I told pt very unlikely.  INR near goal range of 2-2.2 on 5mg  Tu/Th/Sun and 7.5mg  other days.  Will check PT/INR in 1 month.  Pt has appt with Dr. Marko Plume today.

## 2012-02-24 NOTE — Telephone Encounter (Signed)
gv pt appt schedule for November 2014.

## 2012-02-25 ENCOUNTER — Other Ambulatory Visit: Payer: Self-pay | Admitting: Lab

## 2012-02-25 ENCOUNTER — Other Ambulatory Visit: Payer: Self-pay | Admitting: *Deleted

## 2012-02-25 ENCOUNTER — Telehealth: Payer: Self-pay | Admitting: *Deleted

## 2012-02-25 MED ORDER — FERROUS GLUCONATE 325 (36 FE) MG PO TABS: 1.0000 | ORAL_TABLET | Freq: Every day | ORAL | Status: DC

## 2012-02-25 NOTE — Telephone Encounter (Signed)
Message copied by Patton Salles on Wed Feb 25, 2012 12:26 PM ------      Message from: Evlyn Clines P      Created: Wed Feb 25, 2012 11:35 AM       Labs seen and need follow up: please let her know iron is still a little low. Continue oral iron.

## 2012-02-25 NOTE — Telephone Encounter (Signed)
Pt to take OTC iron tablets. Ferrous Gluconate 325

## 2012-02-25 NOTE — Telephone Encounter (Signed)
Notified patient of lab results per Dr Marko Plume note. Pt states that she does not have any Fusion Plus caps. Had been taking samples and is out. Will need prescription if indicated.

## 2012-03-01 NOTE — Progress Notes (Signed)
OFFICE PROGRESS NOTE Date of Visit 02-24-2012 Physicians: M.Croitoru, R.Moriera  INTERVAL HISTORY:  Patient is seen, alone for visit. She is on anticoagulation with coumadin, managed now by [facility] coumadin clinic.She is status post hospitalization, which was from 08-13-11 thru 09-04-11. She has a history of  pulmonary emboli on ~ 3 occasions, had retinal bleed apparently when on coumadin previously, and has IVC filter in (09-2009). She had been off coumadin from time of the retinal bleed until this was resumed prior to DC from hospital. She had mild thrombocytopenia during the recent hospitalization, HIT negative and not significantly different than counts back to at least 2011. She had new bilateral LE DVTs later in the hospitalization, was not a candidate for thrombolytics or thrombectomy, was treated with heparin and converted to coumadin. Goal INR with this history is 2.0 - 2.2. She has had no problems with anticoagulation now, has had gradual improvement in LE swelling and pain.   Patient has multiple other significant co morbidities, including morbid obesity, HTN/ LBBB/ sinus bradycardia, pulmonary HTN, diabetes on insulin, stage IV chronic renal insufficiency,and anemia related to CRF/chronic disease/ bleeding from rectal fissure in hospital. She is followed closely by PCP Murvin Donning.   She presents today for followup appointment. She denies any problems with bleeding or bruising. She does report an episode when returning home from church on Sunday where she states her "spine felt cold and painful". The patient states that she took Valium and Vicodin and rested. The pain became worse and extended across her back and involved her jaw. She became concerned that she was having a heart attack and called 911. She was told.cardiac causes were ruled out and it was thought to do to a "pinched nerve". She reports that the back pain is better but she still has some soreness between her shoulders. She has some  difficulty ambulating even with a cane as she tends to get a fluid build up under her feet making it difficult to exercise. She reports not sleeping well. She continues close followup with her primary care physician.   On Review of Systems otherwise: Denied fever, nausea or vomiting. No constipation or diarrhea. Remainder of 10 point Review of Systems negative/ unchanged.  Objective:  Vital signs in last 24 hours:  BP 163/81  Pulse 61  Temp 96.8 F (36 C) (Oral)  Resp 20  Ht 5\' 3"  (1.6 m)  Wt 274 lb 8 oz (124.512 kg)  BMI 48.63 kg/m2 Ambulatory with cane, does not want to use WC. Respirations not labored RA.   HEENT:mucous membranes moist, pharynx normal without lesions. PERRL, not icteric LymphaticsCervical, supraclavicular, and axillary nodes normal. Resp: clear to auscultation bilaterally Cardio: regular rate and rhythm GI: soft, non-tender; bowel sounds normal; no masses,  no organomegaly Obese, nothing palpable Extremities: 2+ bilateral LE edema to thighs,  Neuro:grossly nonfocal Skin without rash or petechiae  Lab Results:  From [facility] 02[facility]24-12   WBC 7.0, Hgb 9.8, plt 162,Fe 46, TIBC 332, % Sat 14, INR 2.5    Studies/Results:  No results found.  Medications: I have reviewed the patient's current medications.    Assessment/Plan: 1.History of several episodes of PEs, IVC filter in, history of retinal bleed ?while on coumadin, symptomatic bilateral LE DVTs during recent hospitalization now back on coumadin. Continue follow up by [facility] coumadin clinic 2.multifactorial anemia, as above. Iron studies as above 3.intermittent mild thrombocytopenia since at least 2011, HIT negative. 4.morbid obesity 5.cardiac problems as above 6.diabetes 7.pulmonary HTN  Patient discussed  with Dr. Marko Plume. She'll followup with Dr. Marko Plume in one year and will continue her followup with the [facility] Coumadin clinic as scheduled. Patient was comfortable with discussion and  plan   Leah Adam, PA-C   03/01/2012, 9:43 PM

## 2012-03-01 NOTE — Progress Notes (Deleted)
Medical Oncology 10-15-11   Patient seen as work in today from [facility], with 5 day history of painful swelling lateral left foot, without trauma. Pain was severe at onset 10-11-11, then a little better, but much worse again today. She has no history of gout, tho has had similar less severe symptoms in right foot some time ago. She has had no fever or other symptoms of infection. She has slightly more swelling in left lower leg since this problem, no bleeding, INR not quite therapeutic today. PCP is not available this week, tho he is covered by another MD in town. I have spoken to that office now.  Significant medical problems include renal insufficiency, coagulopathy with IVC filter in and back on coumadin since LE DVT below IVC filter during recent hospitalization, diabetes, UTI in hospital, morbid obesity, cardiac disease.  She has felt fairly well other than extremely painful foot. No fever/ chills. No increased SOB. Some increased swelling upper thighs also. No chest pain.  Temp 97, 160/71, 48 regular, 20. Alert, good historian, crying from pain after Xray. Lungs clear. Cor slow rate, regular. Abd soft, NT. Left foot swollen with primary area lateral mid foot ~ 2.5 x 3 cm protuberant area extremely tender, erythematous. Lower left leg with 1-2+ edema, moreso than right lower leg.   Xray of foot shows no bony abnormality.  Assessment/ Plan 1.acute left foot pain x 5 days in complicated patient, possibly gout or infection. No recent chemistries in EMR, not clear if renal function/ cardiac could tolerate NSAID for possible gout. Patient in such discomfort that she will go to ED for further evaluation and intervention 2.coagulopathy: hx PEs, IVC filter in after retinal bleed, now back on coumadin since acute DVT LE in hospital in May. Followed in coumadin clinic [place], trying for low therapeutic INR 3.multiple other medical problems as above  Evlyn Clines, MD

## 2012-03-23 ENCOUNTER — Ambulatory Visit: Payer: Medicaid Other

## 2012-03-23 ENCOUNTER — Other Ambulatory Visit: Payer: Medicaid Other | Admitting: Lab

## 2012-03-25 ENCOUNTER — Other Ambulatory Visit: Payer: Self-pay | Admitting: Internal Medicine

## 2012-03-25 DIAGNOSIS — R0781 Pleurodynia: Secondary | ICD-10-CM

## 2012-03-26 ENCOUNTER — Inpatient Hospital Stay: Admission: RE | Admit: 2012-03-26 | Payer: Medicaid Other | Source: Ambulatory Visit

## 2012-03-26 ENCOUNTER — Ambulatory Visit
Admission: RE | Admit: 2012-03-26 | Discharge: 2012-03-26 | Disposition: A | Discharge status: Home / Self Care | Payer: Medicaid Other | Source: Ambulatory Visit | Attending: Internal Medicine | Admitting: Internal Medicine

## 2012-03-26 DIAGNOSIS — R0781 Pleurodynia: Secondary | ICD-10-CM

## 2012-03-26 MED ORDER — IOHEXOL 350 MG/ML SOLN
80.0000 mL | Freq: Once | INTRAVENOUS | Status: AC | PRN
  Administered 2012-03-26: 80 mL via INTRAVENOUS

## 2012-03-30 ENCOUNTER — Other Ambulatory Visit (HOSPITAL_BASED_OUTPATIENT_CLINIC_OR_DEPARTMENT_OTHER): Payer: Medicaid Other | Admitting: Lab

## 2012-03-30 ENCOUNTER — Ambulatory Visit (HOSPITAL_BASED_OUTPATIENT_CLINIC_OR_DEPARTMENT_OTHER): Payer: Medicaid Other | Admitting: Pharmacist

## 2012-03-30 DIAGNOSIS — I82409 Acute embolism and thrombosis of unspecified deep veins of unspecified lower extremity: Secondary | ICD-10-CM

## 2012-03-30 LAB — PROTIME-INR: INR: 2.6 (ref 2.00–3.50)

## 2012-03-30 NOTE — Progress Notes (Signed)
INR slightly supratherapeutic today (2.6) on Coumadin 15mg  daily except 10mg  on Su, Tu, Th No changes in meds or diet.  No complaints today.  Will have pt continue current dose, and recheck INR in 8 weeks.

## 2012-05-03 ENCOUNTER — Emergency Department (HOSPITAL_COMMUNITY): Payer: Medicaid Other

## 2012-05-03 ENCOUNTER — Inpatient Hospital Stay (HOSPITAL_COMMUNITY)
Admission: EM | Admit: 2012-05-03 | Discharge: 2012-05-11 | DRG: 313 | Disposition: A | Discharge status: Home Health | Payer: Medicaid Other | Attending: Internal Medicine | Admitting: Internal Medicine

## 2012-05-03 ENCOUNTER — Encounter (HOSPITAL_COMMUNITY): Payer: Self-pay | Admitting: *Deleted

## 2012-05-03 DIAGNOSIS — E119 Type 2 diabetes mellitus without complications: Secondary | ICD-10-CM

## 2012-05-03 DIAGNOSIS — D649 Anemia, unspecified: Secondary | ICD-10-CM | POA: Diagnosis present

## 2012-05-03 DIAGNOSIS — D61818 Other pancytopenia: Secondary | ICD-10-CM | POA: Diagnosis present

## 2012-05-03 DIAGNOSIS — R251 Tremor, unspecified: Secondary | ICD-10-CM | POA: Diagnosis present

## 2012-05-03 DIAGNOSIS — F329 Major depressive disorder, single episode, unspecified: Secondary | ICD-10-CM | POA: Diagnosis present

## 2012-05-03 DIAGNOSIS — G4730 Sleep apnea, unspecified: Secondary | ICD-10-CM

## 2012-05-03 DIAGNOSIS — R0602 Shortness of breath: Secondary | ICD-10-CM

## 2012-05-03 DIAGNOSIS — Z7982 Long term (current) use of aspirin: Secondary | ICD-10-CM

## 2012-05-03 DIAGNOSIS — F3289 Other specified depressive episodes: Secondary | ICD-10-CM | POA: Diagnosis present

## 2012-05-03 DIAGNOSIS — I509 Heart failure, unspecified: Secondary | ICD-10-CM | POA: Diagnosis present

## 2012-05-03 DIAGNOSIS — N390 Urinary tract infection, site not specified: Secondary | ICD-10-CM | POA: Diagnosis present

## 2012-05-03 DIAGNOSIS — R109 Unspecified abdominal pain: Secondary | ICD-10-CM | POA: Diagnosis present

## 2012-05-03 DIAGNOSIS — M129 Arthropathy, unspecified: Secondary | ICD-10-CM | POA: Diagnosis present

## 2012-05-03 DIAGNOSIS — I209 Angina pectoris, unspecified: Secondary | ICD-10-CM | POA: Diagnosis present

## 2012-05-03 DIAGNOSIS — Z95828 Presence of other vascular implants and grafts: Secondary | ICD-10-CM

## 2012-05-03 DIAGNOSIS — E1165 Type 2 diabetes mellitus with hyperglycemia: Secondary | ICD-10-CM | POA: Diagnosis present

## 2012-05-03 DIAGNOSIS — F411 Generalized anxiety disorder: Secondary | ICD-10-CM | POA: Diagnosis present

## 2012-05-03 DIAGNOSIS — I5189 Other ill-defined heart diseases: Secondary | ICD-10-CM | POA: Diagnosis present

## 2012-05-03 DIAGNOSIS — D72829 Elevated white blood cell count, unspecified: Secondary | ICD-10-CM | POA: Diagnosis present

## 2012-05-03 DIAGNOSIS — I2789 Other specified pulmonary heart diseases: Secondary | ICD-10-CM | POA: Diagnosis present

## 2012-05-03 DIAGNOSIS — G4733 Obstructive sleep apnea (adult) (pediatric): Secondary | ICD-10-CM

## 2012-05-03 DIAGNOSIS — Z86711 Personal history of pulmonary embolism: Secondary | ICD-10-CM

## 2012-05-03 DIAGNOSIS — R259 Unspecified abnormal involuntary movements: Secondary | ICD-10-CM | POA: Diagnosis present

## 2012-05-03 DIAGNOSIS — I272 Pulmonary hypertension, unspecified: Secondary | ICD-10-CM | POA: Diagnosis present

## 2012-05-03 DIAGNOSIS — Z79899 Other long term (current) drug therapy: Secondary | ICD-10-CM

## 2012-05-03 DIAGNOSIS — A498 Other bacterial infections of unspecified site: Secondary | ICD-10-CM | POA: Diagnosis present

## 2012-05-03 DIAGNOSIS — N184 Chronic kidney disease, stage 4 (severe): Secondary | ICD-10-CM | POA: Diagnosis present

## 2012-05-03 DIAGNOSIS — E162 Hypoglycemia, unspecified: Secondary | ICD-10-CM | POA: Diagnosis present

## 2012-05-03 DIAGNOSIS — Z7901 Long term (current) use of anticoagulants: Secondary | ICD-10-CM

## 2012-05-03 DIAGNOSIS — Z6841 Body Mass Index (BMI) 40.0 and over, adult: Secondary | ICD-10-CM

## 2012-05-03 DIAGNOSIS — R001 Bradycardia, unspecified: Secondary | ICD-10-CM

## 2012-05-03 DIAGNOSIS — IMO0002 Reserved for concepts with insufficient information to code with codable children: Secondary | ICD-10-CM | POA: Diagnosis present

## 2012-05-03 DIAGNOSIS — I447 Left bundle-branch block, unspecified: Secondary | ICD-10-CM | POA: Diagnosis present

## 2012-05-03 DIAGNOSIS — R079 Chest pain, unspecified: Principal | ICD-10-CM | POA: Diagnosis present

## 2012-05-03 DIAGNOSIS — E785 Hyperlipidemia, unspecified: Secondary | ICD-10-CM | POA: Diagnosis present

## 2012-05-03 DIAGNOSIS — K625 Hemorrhage of anus and rectum: Secondary | ICD-10-CM

## 2012-05-03 DIAGNOSIS — F17200 Nicotine dependence, unspecified, uncomplicated: Secondary | ICD-10-CM | POA: Diagnosis present

## 2012-05-03 DIAGNOSIS — E871 Hypo-osmolality and hyponatremia: Secondary | ICD-10-CM | POA: Diagnosis present

## 2012-05-03 DIAGNOSIS — I10 Essential (primary) hypertension: Secondary | ICD-10-CM | POA: Diagnosis present

## 2012-05-03 DIAGNOSIS — I129 Hypertensive chronic kidney disease with stage 1 through stage 4 chronic kidney disease, or unspecified chronic kidney disease: Secondary | ICD-10-CM | POA: Diagnosis present

## 2012-05-03 DIAGNOSIS — N179 Acute kidney failure, unspecified: Secondary | ICD-10-CM | POA: Diagnosis present

## 2012-05-03 DIAGNOSIS — I82409 Acute embolism and thrombosis of unspecified deep veins of unspecified lower extremity: Secondary | ICD-10-CM | POA: Diagnosis present

## 2012-05-03 DIAGNOSIS — E876 Hypokalemia: Secondary | ICD-10-CM | POA: Diagnosis present

## 2012-05-03 DIAGNOSIS — E1169 Type 2 diabetes mellitus with other specified complication: Secondary | ICD-10-CM | POA: Diagnosis present

## 2012-05-03 DIAGNOSIS — I4891 Unspecified atrial fibrillation: Secondary | ICD-10-CM | POA: Diagnosis present

## 2012-05-03 DIAGNOSIS — D62 Acute posthemorrhagic anemia: Secondary | ICD-10-CM | POA: Diagnosis present

## 2012-05-03 DIAGNOSIS — Z794 Long term (current) use of insulin: Secondary | ICD-10-CM

## 2012-05-03 DIAGNOSIS — D696 Thrombocytopenia, unspecified: Secondary | ICD-10-CM

## 2012-05-03 HISTORY — DX: Unspecified atrial fibrillation: I48.91

## 2012-05-03 HISTORY — DX: Obstructive sleep apnea (adult) (pediatric): G47.33

## 2012-05-03 HISTORY — DX: Disorder of kidney and ureter, unspecified: N28.9

## 2012-05-03 LAB — URINALYSIS, ROUTINE W REFLEX MICROSCOPIC
Bilirubin Urine: NEGATIVE
Glucose, UA: NEGATIVE mg/dL
Protein, ur: 30 mg/dL — AB
Specific Gravity, Urine: 1.013 (ref 1.005–1.030)
Urobilinogen, UA: 1 mg/dL (ref 0.0–1.0)

## 2012-05-03 LAB — CBC WITH DIFFERENTIAL/PLATELET
Basophils Absolute: 0 10*3/uL (ref 0.0–0.1)
Basophils Relative: 0 % (ref 0–1)
Eosinophils Absolute: 0.1 10*3/uL (ref 0.0–0.7)
Eosinophils Relative: 1 % (ref 0–5)
HCT: 33.4 % — ABNORMAL LOW (ref 36.0–46.0)
MCH: 28.3 pg (ref 26.0–34.0)
MCHC: 32.6 g/dL (ref 30.0–36.0)
MCV: 86.8 fL (ref 78.0–100.0)
Monocytes Absolute: 1 10*3/uL (ref 0.1–1.0)
RDW: 16.3 % — ABNORMAL HIGH (ref 11.5–15.5)

## 2012-05-03 LAB — TROPONIN I
Troponin I: <0.3 ng/mL (ref <0.30)
Troponin I: <0.3 ng/mL (ref <0.30)
Troponin I: <0.3 ng/mL (ref <0.30)

## 2012-05-03 LAB — COMPREHENSIVE METABOLIC PANEL
Albumin: 3.4 g/dL — ABNORMAL LOW (ref 3.5–5.2)
Alkaline Phosphatase: 74 U/L (ref 39–117)
BUN: 99 mg/dL — ABNORMAL HIGH (ref 6–23)
Chloride: 89 mEq/L — ABNORMAL LOW (ref 96–112)
Potassium: 3.1 mEq/L — ABNORMAL LOW (ref 3.5–5.1)
Total Bilirubin: 0.6 mg/dL (ref 0.3–1.2)

## 2012-05-03 LAB — URINE MICROSCOPIC-ADD ON

## 2012-05-03 LAB — GLUCOSE, CAPILLARY: Glucose-Capillary: 214 mg/dL — ABNORMAL HIGH (ref 70–99)

## 2012-05-03 LAB — MAGNESIUM: Magnesium: 2.6 mg/dL — ABNORMAL HIGH (ref 1.5–2.5)

## 2012-05-03 LAB — PRO B NATRIURETIC PEPTIDE: Pro B Natriuretic peptide (BNP): 1589 pg/mL — ABNORMAL HIGH (ref 0–125)

## 2012-05-03 MED ORDER — INSULIN ASPART 100 UNIT/ML ~~LOC~~ SOLN
0.0000 [IU] | Freq: Three times a day (TID) | SUBCUTANEOUS | Status: DC
  Administered 2012-05-03: 3 [IU] via SUBCUTANEOUS
  Administered 2012-05-04: 2 [IU] via SUBCUTANEOUS

## 2012-05-03 MED ORDER — SODIUM CHLORIDE 0.9 % IV SOLN
INTRAVENOUS | Status: DC
  Administered 2012-05-03: 50 mL/h via INTRAVENOUS
  Administered 2012-05-05: 20 mL/h via INTRAVENOUS
  Administered 2012-05-05 – 2012-05-07 (×2): via INTRAVENOUS

## 2012-05-03 MED ORDER — ASPIRIN EC 81 MG PO TBEC
81.0000 mg | DELAYED_RELEASE_TABLET | Freq: Every day | ORAL | Status: DC
  Administered 2012-05-03 – 2012-05-11 (×9): 81 mg via ORAL
  Filled 2012-05-03 (×9): qty 1

## 2012-05-03 MED ORDER — ADULT MULTIVITAMIN W/MINERALS CH
1.0000 | ORAL_TABLET | Freq: Every day | ORAL | Status: DC
  Administered 2012-05-03 – 2012-05-11 (×9): 1 via ORAL
  Filled 2012-05-03 (×9): qty 1

## 2012-05-03 MED ORDER — TECHNETIUM TO 99M ALBUMIN AGGREGATED
6.0000 | Freq: Once | INTRAVENOUS | Status: AC | PRN
  Administered 2012-05-03: 6 via INTRAVENOUS

## 2012-05-03 MED ORDER — PANTOPRAZOLE SODIUM 40 MG PO TBEC
40.0000 mg | DELAYED_RELEASE_TABLET | Freq: Every day | ORAL | Status: DC
  Administered 2012-05-03 – 2012-05-11 (×9): 40 mg via ORAL
  Filled 2012-05-03 (×7): qty 1

## 2012-05-03 MED ORDER — HYDROMORPHONE HCL PF 1 MG/ML IJ SOLN
0.5000 mg | INTRAMUSCULAR | Status: DC
  Filled 2012-05-03: qty 1

## 2012-05-03 MED ORDER — HYDROMORPHONE HCL PF 1 MG/ML IJ SOLN
INTRAMUSCULAR | Status: AC
  Filled 2012-05-03: qty 1

## 2012-05-03 MED ORDER — LEVOTHYROXINE SODIUM 25 MCG PO TABS
25.0000 ug | ORAL_TABLET | Freq: Every day | ORAL | Status: DC
  Administered 2012-05-03 – 2012-05-11 (×9): 25 ug via ORAL
  Filled 2012-05-03 (×9): qty 1

## 2012-05-03 MED ORDER — INSULIN GLARGINE 100 UNIT/ML ~~LOC~~ SOLN
23.0000 [IU] | Freq: Every day | SUBCUTANEOUS | Status: DC
  Administered 2012-05-03: 23 [IU] via SUBCUTANEOUS

## 2012-05-03 MED ORDER — NITROGLYCERIN 0.4 MG SL SUBL: 0.4000 mg | SUBLINGUAL_TABLET | SUBLINGUAL | Status: DC | PRN

## 2012-05-03 MED ORDER — OMEGA-3-ACID ETHYL ESTERS 1 G PO CAPS
1.0000 g | ORAL_CAPSULE | Freq: Every day | ORAL | Status: DC
  Administered 2012-05-03 – 2012-05-11 (×9): 1 g via ORAL
  Filled 2012-05-03 (×9): qty 1

## 2012-05-03 MED ORDER — ZOLPIDEM TARTRATE 5 MG PO TABS: 5.0000 mg | ORAL_TABLET | Freq: Every evening | ORAL | Status: DC | PRN

## 2012-05-03 MED ORDER — ACETAMINOPHEN 325 MG PO TABS
650.0000 mg | ORAL_TABLET | ORAL | Status: DC | PRN
  Administered 2012-05-09: 650 mg via ORAL
  Filled 2012-05-03 (×2): qty 2

## 2012-05-03 MED ORDER — DIAZEPAM 5 MG PO TABS
5.0000 mg | ORAL_TABLET | Freq: Every evening | ORAL | Status: DC | PRN
  Administered 2012-05-04: 5 mg via ORAL
  Filled 2012-05-03: qty 1

## 2012-05-03 MED ORDER — ALPRAZOLAM 0.25 MG PO TABS: 0.2500 mg | ORAL_TABLET | Freq: Two times a day (BID) | ORAL | Status: DC | PRN

## 2012-05-03 MED ORDER — DEXTROSE 5 % IV SOLN
1.0000 g | INTRAVENOUS | Status: DC
  Administered 2012-05-03 – 2012-05-09 (×7): 1 g via INTRAVENOUS
  Filled 2012-05-03 (×9): qty 10

## 2012-05-03 MED ORDER — HYDROMORPHONE HCL PF 1 MG/ML IJ SOLN
1.0000 mg | INTRAMUSCULAR | Status: DC | PRN
  Administered 2012-05-03 – 2012-05-07 (×12): 1 mg via INTRAVENOUS
  Filled 2012-05-03 (×12): qty 1

## 2012-05-03 MED ORDER — INSULIN ASPART 100 UNIT/ML ~~LOC~~ SOLN
5.0000 [IU] | Freq: Three times a day (TID) | SUBCUTANEOUS | Status: DC
  Administered 2012-05-03 – 2012-05-04 (×2): 5 [IU] via SUBCUTANEOUS

## 2012-05-03 MED ORDER — HYDROMORPHONE HCL PF 1 MG/ML IJ SOLN
0.5000 mg | Freq: Once | INTRAMUSCULAR | Status: AC
  Administered 2012-05-03: 0.5 mg via INTRAVENOUS

## 2012-05-03 MED ORDER — HYDROCODONE-ACETAMINOPHEN 5-325 MG PO TABS
1.0000 | ORAL_TABLET | ORAL | Status: DC | PRN
  Administered 2012-05-03 – 2012-05-04 (×2): 1 via ORAL
  Filled 2012-05-03 (×2): qty 1

## 2012-05-03 MED ORDER — POTASSIUM CHLORIDE CRYS ER 20 MEQ PO TBCR
40.0000 meq | EXTENDED_RELEASE_TABLET | Freq: Once | ORAL | Status: AC
  Administered 2012-05-03: 40 meq via ORAL

## 2012-05-03 MED ORDER — ATORVASTATIN CALCIUM 10 MG PO TABS
10.0000 mg | ORAL_TABLET | Freq: Every day | ORAL | Status: DC
  Administered 2012-05-03 – 2012-05-10 (×8): 10 mg via ORAL
  Filled 2012-05-03 (×10): qty 1

## 2012-05-03 MED ORDER — XENON XE 133 GAS
10.0000 | GAS_FOR_INHALATION | Freq: Once | RESPIRATORY_TRACT | Status: AC | PRN
  Administered 2012-05-03: 10 via RESPIRATORY_TRACT

## 2012-05-03 MED ORDER — NITROGLYCERIN 2 % TD OINT
1.0000 [in_us] | TOPICAL_OINTMENT | Freq: Once | TRANSDERMAL | Status: AC
  Administered 2012-05-03: 1 [in_us] via TOPICAL
  Filled 2012-05-03: qty 1

## 2012-05-03 MED ORDER — POTASSIUM CHLORIDE CRYS ER 20 MEQ PO TBCR
40.0000 meq | EXTENDED_RELEASE_TABLET | Freq: Once | ORAL | Status: AC
  Administered 2012-05-03: 40 meq via ORAL
  Filled 2012-05-03 (×2): qty 2

## 2012-05-03 MED ORDER — METOPROLOL TARTRATE 50 MG PO TABS
50.0000 mg | ORAL_TABLET | Freq: Two times a day (BID) | ORAL | Status: DC
  Administered 2012-05-03 – 2012-05-11 (×16): 50 mg via ORAL
  Filled 2012-05-03 (×18): qty 1

## 2012-05-03 MED ORDER — NITROGLYCERIN 2 % TD OINT
0.5000 [in_us] | TOPICAL_OINTMENT | Freq: Four times a day (QID) | TRANSDERMAL | Status: DC
  Administered 2012-05-03 – 2012-05-05 (×5): 0.5 [in_us] via TOPICAL
  Filled 2012-05-03: qty 30

## 2012-05-03 MED ORDER — CITALOPRAM HYDROBROMIDE 20 MG PO TABS
20.0000 mg | ORAL_TABLET | Freq: Every day | ORAL | Status: DC
  Administered 2012-05-03 – 2012-05-10 (×8): 20 mg via ORAL
  Filled 2012-05-03 (×9): qty 1

## 2012-05-03 MED ORDER — ONDANSETRON HCL 4 MG/2ML IJ SOLN: 4.0000 mg | Freq: Four times a day (QID) | INTRAMUSCULAR | Status: DC | PRN

## 2012-05-03 MED ORDER — ASPIRIN EC 81 MG PO TBEC: 81.0000 mg | DELAYED_RELEASE_TABLET | Freq: Every day | ORAL | Status: DC

## 2012-05-03 NOTE — H&P (Signed)
[name] is an 57 y.o. female.    Cardiologist: Dr. Sallyanne Kuster  Chief Complaint: chest pain HPI: 57 y.o. female with a history of pulmonary embolism, hypertension, hyperlipidemia, diabetes x20 years congestive heart failure-secondary to diastolic dysfunciton and pul. hypertension,  and known three-vessel calcification by CT and a negative stress test in May of 2013 presents with 2 days of shortness of breath that has changed somewhat over the course of the pain/SOB. Patient was to followup with her primary care physician this morning however had onset of severe chest pressure during the middle the night. Chest pressure was an 8/10 on arrival and is considered "an elephant on my chest", she said first nitroglycerin did not help via EMS, subsequent nitroglycerin did give her some relief. She did receive 3 and 24 mg of aspirin by EMS.  She related the pain as pressure.  Over the last 2-3 days it was associated with nausea on occ. Diaphoresis and SOB.  Mid sternal chest pressure that radiated across chest today and down lt. Arm.   In ER neg VQ scan for pul. Embolus with elevated D dimer.  Pt is on coumadin and also a [place] filter.  She has CKD stage IV which has made proceeding with cardiac cath difficult.  Currently EKG without acute changes, and with Dilaudid her pain has resolved.  Coumadin followed by Dr. Mariana Kaufman office.  Never had a cardiac cath.  Last echo 08/14/11:  Left ventricle: Systolic function was vigorous. The estimated ejection fraction was in the range of 65% to 70%. Wall motion was normal; there were no regional wall motion abnormalities. Features are consistent with a pseudonormal left ventricular filling pattern, with concomitant abnormal relaxation and increased filling pressure (grade 2 diastolic dysfunction). Doppler parameters are consistent with elevated mean left atrial filling pressure. - Mitral valve: Calcified annulus. - Left atrium: The atrium was mildly  dilated   In May 2013 she was + for Bil. DVTs and coumadin was started despite hx of GI bleed.  VQ was negative for PE in May 2013.   Past Medical History  Diagnosis Date  . CHF (congestive heart failure)   . Diabetes mellitus   . Arthritis   . Hypertension   . Depression   . DVT (deep venous thrombosis) 10 years ago  . PE (pulmonary thromboembolism) 3 years ag0  . Renal disorder     kindey function low  . A-fib     Past Surgical History  Procedure Date  . Eye surgery   . Cholecystectomy   . Cesarean section   . Ivc     ivc filter    History reviewed. No pertinent family history. Social History:  reports that she has been passively smoking.  She does not have any smokeless tobacco history on file. She reports that she does not drink alcohol or use illicit drugs.divorced, 1 child.  Allergies:  Allergies  Allergen Reactions  . Morphine And Related Rash    Outpatient medications: Tylenol prn ASA 81 mg daily Valium 5 mg at hs prn Lasix 80 mg daily Vicodin 5/500 1 every 8 hours prn Lantus 23 units at HS APIDRA insulin 5-10 units before meals Synthroid 25 mcg daily Zaroxolyn 2.5 mg MWF Lopressor 50 mg BID Multivit daily Omega 3 1200 mg daily protonix 40 mg daily Coumadin 2.5 mg tablet,  7.5 mg on MWFSat and 5 mg on Tues, Thur, and Sunday - followed by Dr. Marko Plume.  Results for orders placed during the hospital encounter of  05/03/12 (from the past 48 hour(s))  COMPREHENSIVE METABOLIC PANEL     Status: Abnormal   Collection Time   05/03/12  5:25 AM      Component Value Range Comment   Sodium 137  135 - 145 mEq/L    Potassium 3.1 (*) 3.5 - 5.1 mEq/L    Chloride 89 (*) 96 - 112 mEq/L    CO2 32  19 - 32 mEq/L    Glucose, Bld 133 (*) 70 - 99 mg/dL    BUN 99 (*) 6 - 23 mg/dL    Creatinine, Ser 2.28 (*) 0.50 - 1.10 mg/dL    Calcium 9.6  8.4 - 10.5 mg/dL    Total Protein 7.6  6.0 - 8.3 g/dL    Albumin 3.4 (*) 3.5 - 5.2 g/dL    AST 17  0 - 37 U/L    ALT 12  0 - 35  U/L    Alkaline Phosphatase 74  39 - 117 U/L    Total Bilirubin 0.6  0.3 - 1.2 mg/dL    GFR calc non Af Amer 23 (*) >90 mL/min    GFR calc Af Amer 26 (*) >90 mL/min   CBC WITH DIFFERENTIAL     Status: Abnormal   Collection Time   05/03/12  5:25 AM      Component Value Range Comment   WBC 13.2 (*) 4.0 - 10.5 K/uL    RBC 3.85 (*) 3.87 - 5.11 MIL/uL    Hemoglobin 10.9 (*) 12.0 - 15.0 g/dL    HCT 33.4 (*) 36.0 - 46.0 %    MCV 86.8  78.0 - 100.0 fL    MCH 28.3  26.0 - 34.0 pg    MCHC 32.6  30.0 - 36.0 g/dL    RDW 16.3 (*) 11.5 - 15.5 %    Platelets 161  150 - 400 K/uL    Neutrophils Relative 85 (*) 43 - 77 %    Neutro Abs 11.3 (*) 1.7 - 7.7 K/uL    Lymphocytes Relative 7 (*) 12 - 46 %    Lymphs Abs 0.9  0.7 - 4.0 K/uL    Monocytes Relative 7  3 - 12 %    Monocytes Absolute 1.0  0.1 - 1.0 K/uL    Eosinophils Relative 1  0 - 5 %    Eosinophils Absolute 0.1  0.0 - 0.7 K/uL    Basophils Relative 0  0 - 1 %    Basophils Absolute 0.0  0.0 - 0.1 K/uL   PROTIME-INR     Status: Abnormal   Collection Time   05/03/12  5:25 AM      Component Value Range Comment   Prothrombin Time 34.0 (*) 11.6 - 15.2 seconds    INR 3.62 (*) 0.00 - 1.49   D-DIMER, QUANTITATIVE     Status: Abnormal   Collection Time   05/03/12  5:25 AM      Component Value Range Comment   D-Dimer, Quant 0.74 (*) 0.00 - 0.48 ug/mL-FEU   TROPONIN I     Status: Normal   Collection Time   05/03/12  5:30 AM      Component Value Range Comment   Troponin I <0.30  <0.30 ng/mL    Dg Chest 2 View  05/03/2012  *RADIOLOGY REPORT*  Clinical Data: Chest pain, shortness of breath.  CHEST - 2 VIEW  Comparison: 03/26/2012 CT  Findings: Heart size upper normal.  Pulmonary arterial prominence. Mild bibasilar opacities.  No definite  pleural effusion or pneumothorax.  Limited osseous bowel evaluation without definite acute process  IMPRESSION: Mild lung base opacities; atelectasis (favored) versus infiltrate.  Pulmonary arterial hypertension.    Original Report Authenticated By: Carlos Levering, M.D.    Nm Pulmonary Perf And Vent  05/03/2012  *RADIOLOGY REPORT*  Clinical Data:  Shortness of breath  NM PULMONARY VENTILATION AND PERFUSION SCANS; ventilation study with re- breathing  Views:  Anterior and posterior:  Ventilation; anterior, posterior, right lateral, left lateral, RPO, LAO, LPO, RAO:  Perfusion  Radiopharmaceutical: Xenon 133 gas:  Ventilation; technetium 58m macroaggregated albumin:  Perfusion  Dose:  10.0 mCi:  Ventilation; 6.0 mCi:  Perfusion  Route of administration:  Inhalation:  Ventilation; intravenous: Perfusion  Comparison:  Chest radiograph May 03, 2012  Findings: The ventilation study shows homogeneous and symmetric uptake of radiotracer with prompt washout bilaterally.  The perfusion study shows homogeneous and  symmetric uptake without appreciable perfusion defect.  There is no appreciable ventilation / perfusion mismatch.  IMPRESSION: Essentially normal ventilation and perfusion lung scans.  Very low probability of pulmonary embolus.   Original Report Authenticated By: Lowella Grip, M.D.    03/26/12  CT angio of chest : IMPRESSION:  1. No evidence of pulmonary embolism.  2. No acute findings in the thorax to account for the patient's  symptoms.  3. Marked dilatation of the pulmonic trunk, suggestive of  pulmonary arterial hypertension.  4. Mild cardiomegaly with probable asymmetric left ventricular  hypertrophy, most pronounced in the region of the basal septum.  Echocardiographic correlation may be warranted if clinically  indicated.  5. Atherosclerosis, including three-vessel coronary artery  disease. Please note that although the presence of coronary artery  calcium documents the presence of coronary artery disease, the  severity of this disease and any potential stenosis cannot be  assessed on this non-gated CT examination. Assessment for  potential risk factor modification, dietary therapy or    pharmacologic therapy may be warranted, if clinically indicated.    ROS: General:no colds or fevers, no weight changes Skin:no rashes or ulcers HEENT:no blurred vision, no congestion CV:see HPI PUL:see HPI GI:no diarrhea constipation or melena, no indigestion GU:no hematuria, no dysuria MS:no joint pain, no claudication Neuro:no syncope, + lightheadedness. Has had trouble walking she becomes dizzy and weak.  Endo:+ diabetes-controlled, + thyroid disease   Blood pressure 100/32, pulse 77, temperature 98.3 F (36.8 C), temperature source Oral, resp. rate 15, height 5\' 3"  (1.6 m), weight 113.853 kg (251 lb), SpO2 95.00%. PE: General:alert and oriented, though somewhat sedated after dilaudid, pleasant affect Skin:warm and dry, brisk capillary refill HEENT:normocephalic, sclera clear Neck:supple no JVD, no bruits [id number] RRR [id number] systolic murmur no rub or click Lungs:diminished bil, no wheezes, no rales [id number], soft, non tender, + BS [id number] of both lower ext. -she stated that this is old.-2+ pedal pulses bil. No lower ext. edema Neuro:alert and oriented X 3 MAE, follows commands.    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  Presence of IVC filter, placed June 2011 after 3d PE  Pulmonary HTN, Moderate  PA pressure [id number]  Diastolic dysfunction, grade 2, EF 65-70% May 2013  LBBB (left bundle branch block)  Anticoagulated on Coumadin, chronically  PLAN: Initial troponin is negative.  Pain now relieved with Dilaudid, though NTG Sl did seem  to help.  She has minimal chest pressure now.  With CAD on CT of chest, concern this is cardiac.  She is diabetic, hypertensive.  She was also positive for sleep apnea, though she was never  placed on cpap and no follow up.      Serum Cr. Has been 1.69 to 2.38 probably lives at 2.10.    Admit r/o MI,  Hypokalemic will replace, INR supratherpeutic.   [name] R 05/03/2012, 10:54 AM

## 2012-05-03 NOTE — Progress Notes (Signed)
ANTICOAGULATION CONSULT NOTE - Initial Consult  Pharmacy Consult for heparin Indication: chest pain/ACS  Allergies  Allergen Reactions  . Morphine And Related Rash    Patient Measurements: Height: 5\' 3"  (160 cm) Weight: 251 lb (113.853 kg) IBW/kg (Calculated) : 52.4   Vital Signs: Temp: 98.5 F (36.9 C) (01/27 1100) Temp src: Oral (01/27 1100) BP: 104/46 mmHg (01/27 1200) Pulse Rate: 73  (01/27 1200)  Labs:  Basename 05/03/12 0530 05/03/12 0525  HGB -- 10.9*  HCT -- 33.4*  PLT -- 161  APTT -- --  LABPROT -- 34.0*  INR -- 3.62*  HEPARINUNFRC -- --  CREATININE -- 2.28*  CKTOTAL -- --  CKMB -- --  TROPONINI <0.30 --    Estimated Creatinine Clearance: 33.5 ml/min (by C-G formula based on Cr of 2.28).   Medical History: Past Medical History  Diagnosis Date  . CHF (congestive heart failure)   . Diabetes mellitus   . Arthritis   . Hypertension   . Depression   . DVT (deep venous thrombosis) 10 years ago  . PE (pulmonary thromboembolism) 3 years ag0  . Renal disorder     kindey function low  . A-fib   . Sleep apnea, has not started titration  05/03/2012    Medications:   (Not in a hospital admission)  Assessment: 65 yof on chronic coumadin for hx of PE, DVT and afib to begin on IV heparin for CP when INR<2.1. Today INR is supratherapeutic at 3.62. No bleeding noted. H/H is slightly low, plts are WNL.   Goal of Therapy:  Heparin level 0.3-0.7 units/ml Monitor platelets by anticoagulation protocol: Yes   Plan:  1. No heparin for now - initiate when INR<2.1 2. Daily INR  Samari Bittinger, Rande Lawman 05/03/2012,12:38 PM

## 2012-05-03 NOTE — ED Notes (Signed)
L. Dorene Ar, NP with [facility] at bedside.

## 2012-05-03 NOTE — Consult Note (Signed)
[facility] Medical Consultation  [name] [id number] DOB: 09/05/1955 DOA: 05/03/2012 PCP: [name], MD   Requesting physician: [name], MD Date of consultation: 05/03/2012 Reason for consultation: pneumonia  Impression/Recommendations 1. UTI: empiric Rocephin, treat 3 days, followup culture. 2. Abnormal chest x-ray: Would favor atelectasis with clinical history of minimal cough, normal respiratory rate and no hypoxia. Monitor leukocytosis. If does not improve could consider adding atypical coverage to Rocephin. 3. Leukocytosis: Monitor. Secondary to infection versus stress reaction. 4. Diabetes mellitus: Glucose 133 today. Continue Lantus 20 units daily, had meal coverage 5 units 3 times a day, sliding scale insulin. Can resume Amaryl if okay with cardiology. 5. History of DVT/PE: Warfarin. 6. Normocytic anemia: Stable. Likely secondary to chronic disease. 7. Chronic kidney disease stage IV: Appears close to baseline.  All cardiac acute and chronic issues deferred to cardiology.  Will followup again tomorrow. Please contact me if I can be of assistance in the meanwhile. Thank you for this consultation.  [name], MD  [facility] Team 5 Pager [phone] If 7PM-7AM, please contact night-coverage at [email or web address], password [email or web address]  Chief Complaint: Dysuria  HPI:  57 year old woman complex past medical history presented emergency department today with chest pain and shortness of breath. She was admitted by the cardiology service. Urinalysis was noted to be abnormal and chest x-ray revealed atelectasis versus infiltrate. I was asked to comment on these 2 issues.  The patient reports several days of dysuria and frequency but no hesitancy. She also reports a minimal amount of cough for the last several days. She has had some shortness of breath which she associates with chest pain.  Urinalysis grossly positive. Chest x-ray atelectasis versus  infiltrate.  Review of Systems:  Negative for fever, visual changes, rash, new muscle aches, bleeding, abdominal pain.  Positive for sore throat for several days.  Past Medical History  Diagnosis Date  . CHF (congestive heart failure)   . Diabetes mellitus   . Arthritis   . Hypertension   . Depression   . DVT (deep venous thrombosis) 10 years ago  . PE (pulmonary thromboembolism) 3 years ag0  . Renal disorder     kindey function low  . A-fib   . Sleep apnea, has not started titration  05/03/2012   Past Surgical History  Procedure Date  . Eye surgery   . Cholecystectomy   . Cesarean section   . Ivc     ivc filter   Social History:  reports that she has been passively smoking.  She does not have any smokeless tobacco history on file. She reports that she does not drink alcohol or use illicit drugs.  Allergies  Allergen Reactions  . Morphine And Related Rash   Family History  Problem Relation Age of Onset  . Cerebral aneurysm Mother   . Hypertension Father     Prior to Admission medications   Medication Sig Start Date End Date Taking? Authorizing Provider  aspirin EC 81 MG tablet Take 81 mg by mouth daily.   Yes Historical Provider, MD  citalopram (CELEXA) 20 MG tablet Take 20 mg by mouth at bedtime.   Yes Historical Provider, MD  diazepam (VALIUM) 5 MG tablet Take 5 mg by mouth at bedtime as needed. For muscle spasms.   Yes Historical Provider, MD  furosemide (LASIX) 80 MG tablet Take 1 tablet (80 mg total) by mouth daily. 09/04/11 09/03/12 Yes Luke Gerda Diss, PA  glimepiride (AMARYL) 2 MG tablet Take 2 mg by mouth daily  before breakfast.   Yes Historical Provider, MD  HYDROcodone-acetaminophen (VICODIN) 5-500 MG per tablet Take 1 tablet by mouth every 8 (eight) hours as needed. For pain.   Yes Historical Provider, MD  insulin glargine (LANTUS) 100 UNIT/ML injection Inject 23 Units into the skin See admin instructions. She uses a sliding scale.   Yes Historical Provider, MD   insulin glulisine (APIDRA) 100 UNIT/ML injection Inject 10 Units into the skin 3 (three) times daily before meals.  09/08/11  Yes Historical Provider, MD  levothyroxine (SYNTHROID, LEVOTHROID) 25 MCG tablet Take 25 mcg by mouth daily.   Yes Historical Provider, MD  metolazone (ZAROXOLYN) 2.5 MG tablet Take one tablet every Monday/Wednesday?Friday. Take 30 minutes prior to taking Furosemide 09/04/11  Yes Erlene Quan, PA  metoprolol (LOPRESSOR) 50 MG tablet Take 50 mg by mouth 2 (two) times daily.   Yes Historical Provider, MD  Multiple Vitamin (MULITIVITAMIN WITH MINERALS) TABS Take 1 tablet by mouth daily.   Yes Historical Provider, MD  Omega-3 Fatty Acids (FISH OIL) 1200 MG CAPS Take 1 capsule by mouth 2 (two) times daily.   Yes Historical Provider, MD  pantoprazole (PROTONIX) 40 MG tablet Take 1 tablet (40 mg total) by mouth daily at 12 noon. 09/04/11 09/03/12 Yes Erlene Quan, PA  warfarin (COUMADIN) 2.5 MG tablet Take 5-7.5 mg by mouth daily. 5 mg on Tue,Thu,Sat  and then 7.5 mg on Sun, Mon,Wed,Fri   Yes Historical Provider, MD  OVER THE COUNTER MEDICATION Take 2 tablets by mouth daily. Cherry 1000mg  tablets.    Historical Provider, MD   Physical Exam: Filed Vitals:   05/03/12 0945 05/03/12 1100 05/03/12 1200 05/03/12 1300  BP: 100/32 104/40 104/46 126/52  Pulse: 77 73 73 67  Temp:  98.5 F (36.9 C)    TempSrc:  Oral    Resp: 15 19 20 16   Height:      Weight:      SpO2: 95% 96% 96% 98%     General:  Appears calm and comfortable  Eyes: PERRL, normal lids, irises   ENT: grossly normal hearing, lips  Neck: no LAD, masses or thyromegaly  Cardiovascular: RRR, no m/r/g. No LE edema. She has some pain with palpation of lower ribs on the left side.  Respiratory: CTA bilaterally, no w/r/r. Normal respiratory effort.  Abdomen: soft, ntnd  Skin: Chronic skin changes bilateral lower extremities.  Musculoskeletal: Appears grossly unremarkable.  Psychiatric: grossly normal mood and  affect, speech fluent and appropriate  Neurologic: grossly non-focal.  Labs on Admission:  Basic Metabolic Panel:  Lab 0000000 0525  NA 137  K 3.1*  CL 89*  CO2 32  GLUCOSE 133*  BUN 99*  CREATININE 2.28*  CALCIUM 9.6  MG --  PHOS --   Liver Function Tests:  Lab 05/03/12 0525  AST 17  ALT 12  ALKPHOS 74  BILITOT 0.6  PROT 7.6  ALBUMIN 3.4*   CBC:  Lab 05/03/12 0525  WBC 13.2*  NEUTROABS 11.3*  HGB 10.9*  HCT 33.4*  MCV 86.8  PLT 161   Cardiac Enzymes:  Lab 05/03/12 0530  CKTOTAL --  CKMB --  CKMBINDEX --  TROPONINI <0.30    Radiological Exams on Admission: Dg Chest 2 View  05/03/2012  *RADIOLOGY REPORT*  Clinical Data: Chest pain, shortness of breath.  CHEST - 2 VIEW  Comparison: 03/26/2012 CT  Findings: Heart size upper normal.  Pulmonary arterial prominence. Mild bibasilar opacities.  No definite pleural effusion or pneumothorax.  Limited  osseous bowel evaluation without definite acute process  IMPRESSION: Mild lung base opacities; atelectasis (favored) versus infiltrate.  Pulmonary arterial hypertension.   Original Report Authenticated By: Carlos Levering, M.D.    Nm Pulmonary Perf And Vent  05/03/2012  *RADIOLOGY REPORT*  Clinical Data:  Shortness of breath  NM PULMONARY VENTILATION AND PERFUSION SCANS; ventilation study with re- breathing  Views:  Anterior and posterior:  Ventilation; anterior, posterior, right lateral, left lateral, RPO, LAO, LPO, RAO:  Perfusion  Radiopharmaceutical: Xenon 133 gas:  Ventilation; technetium 1m macroaggregated albumin:  Perfusion  Dose:  10.0 mCi:  Ventilation; 6.0 mCi:  Perfusion  Route of administration:  Inhalation:  Ventilation; intravenous: Perfusion  Comparison:  Chest radiograph May 03, 2012  Findings: The ventilation study shows homogeneous and symmetric uptake of radiotracer with prompt washout bilaterally.  The perfusion study shows homogeneous and  symmetric uptake without appreciable perfusion defect.   There is no appreciable ventilation / perfusion mismatch.  IMPRESSION: Essentially normal ventilation and perfusion lung scans.  Very low probability of pulmonary embolus.   Original Report Authenticated By: Lowella Grip, M.D.       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  Presence of IVC filter, placed June 2011 after 3d PE  Pulmonary HTN, Moderate  PA pressure [id number] [id number]  Diastolic dysfunction, grade 2, EF 65-70% May 2013  LBBB (left bundle branch block)  Chronic renal insufficiency, stage IV (severe) - due to DM & HTN, SCr improving-1.6  DVT, bil in 08/2011  Anticoagulated on Coumadin, chronically  Sleep apnea, has not started titration     Time spent: 45 minutes  [name], MD  [facility] Team 5  Pager [phone] If 7PM-7AM, please contact night-coverage at [email or web address], password [email or web address] 05/03/2012, 1:13 PM

## 2012-05-03 NOTE — ED Notes (Signed)
Per EMS: pt called for CP. Pain in her chest for 3 days, pt states she has been dry coughing for 3 days as well. Pt has hx of clots in lungs. Pt alert and oriented, tearful upon arrival to hospital.

## 2012-05-03 NOTE — ED Notes (Signed)
Pt states that her CP has been going on for 3 days. Pt states that throughout the last three days she has been having increased weakness, dizziness when standing and SOB. Pt states that she has significant history of clots and is worried about another clot in her lung. Pt states that when she takes a deep breath she has increased pain. Pt states that her legs were swollen and increased in weakness. pt legs appeared discolored (per norm for pt) but no swollen. Pt ambulated to bed from EMS stretcher with assistance.

## 2012-05-03 NOTE — ED Notes (Signed)
Dr Hilty at bedside. 

## 2012-05-03 NOTE — ED Provider Notes (Signed)
History     CSN: [id number]  Arrival date & time 05/03/12  0446   First MD Initiated Contact with Patient 05/03/12 [phone]      Chief Complaint  Patient presents with  . Chest Pain    (Consider location/radiation/quality/duration/timing/severity/associated sxs/prior treatment) HPISheila D [name] is a 57 y.o. female with a history of pulmonary embolism, hypertension, hyperlipidemia, diabetes x20 years congestive heart failure, hypertension, diabetes mellitus and known three-vessel calcification by CT and a negative stress test in May of 2013 presents with 2 days of shortness of breath that has changed somewhat over the course of the evening. Patient is to followup with her primary care physician morning however had onset of severe chest pressure during the middle the night. Chest pressure is now an 8/10 and is considered "an elephant on my chest", she said first nitroglycerin did not help via EMS, subsequent nitroglycerin did give her some relief. She did receive 3 and 24 mg of aspirin by EMS.  Patient is currently taking Coumadin for recurrent pulmonary embolisms and has a Greenfield filter. Patient follows with [facility] cardiology.  Patient has an remote smoking history for "smoking while drinking" - however denies that she was a full-time smoker. Denies any current ethanol or illicit drug use.   Past Medical History  Diagnosis Date  . CHF (congestive heart failure)   . Diabetes mellitus   . Arthritis   . Hypertension   . Depression   . DVT (deep venous thrombosis) 10 years ago  . PE (pulmonary thromboembolism) 3 years ag0  . Renal disorder     kindey function low  . A-fib     Past Surgical History  Procedure Date  . Eye surgery   . Cholecystectomy   . Cesarean section   . Ivc     ivc filter    History reviewed. No pertinent family history.  History  Substance Use Topics  . Smoking status: Passive Smoke Exposure - Never Smoker  . Smokeless tobacco: Not on file  .  Alcohol Use: No    OB History    Grav Para Term Preterm Abortions TAB SAB Ect Mult Living                  Review of Systems At least 10pt or greater review of systems completed and are negative except where specified in the HPI.  Allergies  Morphine and related  Home Medications   Current Outpatient Rx  Name  Route  Sig  Dispense  Refill  . ASPIRIN EC 81 MG PO TBEC   Oral   Take 81 mg by mouth daily.         Marland Kitchen CITALOPRAM HYDROBROMIDE 20 MG PO TABS   Oral   Take 20 mg by mouth at bedtime.         Marland Kitchen DIAZEPAM 5 MG PO TABS   Oral   Take 5 mg by mouth at bedtime as needed. For muscle spasms.         . FERROUS GLUCONATE 325 (36 FE) MG PO TABS   Oral   Take 1 tablet by mouth daily.   30 tablet      . FUROSEMIDE 80 MG PO TABS   Oral   Take 1 tablet (80 mg total) by mouth daily.   30 tablet   11   . GLIMEPIRIDE 2 MG PO TABS   Oral   Take 2 mg by mouth daily before breakfast.         .  HYDROCODONE-ACETAMINOPHEN 5-500 MG PO TABS   Oral   Take 1 tablet by mouth every 8 (eight) hours as needed. For pain.         . INSULIN GLARGINE 100 UNIT/ML [place] SOLN   Subcutaneous   Inject 30 Units into the skin See admin instructions. She uses a sliding scale.         . INSULIN GLULISINE 100 UNIT/ML  SOLN   Subcutaneous   Inject 5-10 Units into the skin 3 (three) times daily before meals. 5 units with each meal and increase as needed by 2 units unit your 2 hour post meal blood sugars are less than 140. Per Dr [name] 09/10/11         . FUSION PLUS PO CAPS   Oral   Take 1 capsule by mouth daily.         Marland Kitchen LEVOTHYROXINE SODIUM 25 MCG PO TABS   Oral   Take 25 mcg by mouth daily.         Marland Kitchen METOLAZONE 2.5 MG PO TABS      Take one tablet every Monday/Wednesday?Friday. Take 30 minutes prior to taking Furosemide   30 tablet   5   . METOPROLOL TARTRATE 50 MG PO TABS   Oral   Take 50 mg by mouth 2 (two) times daily.         . ADULT MULTIVITAMIN  W/MINERALS CH   Oral   Take 1 tablet by mouth daily.         Marland Kitchen FISH OIL 1200 MG PO CAPS   Oral   Take 1 capsule by mouth 2 (two) times daily.         Marland Kitchen OVER THE COUNTER MEDICATION   Oral   Take 2 tablets by mouth daily. Cherry 1000mg  tablets.         Marland Kitchen PANTOPRAZOLE SODIUM 40 MG PO TBEC   Oral   Take 1 tablet (40 mg total) by mouth daily at 12 noon.   30 tablet   11   . POTASSIUM CHLORIDE 20 MEQ PO PACK   Oral   Take 20 mEq by mouth daily. Effertasium dissolving tablets 67mEq - dissolve 1 daily in 2-4 oz water         . WARFARIN SODIUM 2.5 MG PO TABS   Oral   Take 5-7.5 mg by mouth daily. 5 mg on Tue,Thu,Sat  and then 7.5 mg on Sun, Mon,Wed,Fri           BP 119/50  Pulse 64  Temp 98.3 F (36.8 C) (Oral)  Resp 16  SpO2 100%  Physical Exam  Nursing notes reviewed.  Electronic medical record reviewed. VITAL SIGNS:   Filed Vitals:   05/03/12 0504 05/03/12 0619 05/03/12 0720 05/03/12 0840  BP: 119/50  111/84 125/39  Pulse: 64  61 69  Temp: 98.3 F (36.8 C)     TempSrc: Oral     Resp: 16  16 12   Height:  5\' 3"  (1.6 m)    Weight:  251 lb (113.853 kg)    SpO2: 100%  96% 97%   CONSTITUTIONAL: Awake, oriented, appears non-toxic HENT: Atraumatic, normocephalic, oral mucosa pink and moist, airway patent. Nares patent without drainage. External ears normal. EYES: Conjunctiva clear, EOMI, PERRLA NECK: Trachea midline, non-tender, supple CARDIOVASCULAR: Normal heart rate, Normal rhythm, No rubs, gallops; 2/6 systolic ejection murmur at the right upper sternal border. PULMONARY/CHEST: Clear to auscultation, no rhonchi, wheezes, or rales. Symmetrical breath sounds. Non-tender. ABDOMINAL: Non-distended,  soft, morbidly obese, non-tender - no rebound or guarding.  BS normal. Back: Dime-sized purple bruise on the left thoracic paraspinous region NEUROLOGIC: Non-focal, moving all four extremities, gross sensory deficits to the LEs bilaterally to light touch - chronic, No  motor deficits. EXTREMITIES: No clubbing, cyanosis. 1+ nonpitting edema lower extremities bilaterally, stasis dermatitis SKIN: Warm, Dry, No erythema, No rash ED Course  Procedures (including critical care time)  Date: 05/03/2012  Rate: 63  Rhythm: sinus rhythm  QRS Axis: normal  Intervals: normal  ST/T Wave abnormalities: normal  Conduction Disutrbances: Intraventricular conduction delay  Narrative Interpretation: No significant change from prior ECG dated 01/25/2012-no ST or T wave abnormalities suggestive of acute ischemia or infarction  Labs Reviewed  COMPREHENSIVE METABOLIC PANEL - Abnormal; Notable for the following:    Potassium 3.1 (*)     Chloride 89 (*)     Glucose, Bld 133 (*)     BUN 99 (*)     Creatinine, Ser 2.28 (*)     Albumin 3.4 (*)     GFR calc non Af Amer 23 (*)     GFR calc Af Amer 26 (*)     All other components within normal limits  CBC WITH DIFFERENTIAL - Abnormal; Notable for the following:    WBC 13.2 (*)     RBC 3.85 (*)     Hemoglobin 10.9 (*)     HCT 33.4 (*)     RDW 16.3 (*)     Neutrophils Relative 85 (*)     Neutro Abs 11.3 (*)     Lymphocytes Relative 7 (*)     All other components within normal limits  PROTIME-INR - Abnormal; Notable for the following:    Prothrombin Time 34.0 (*)     INR 3.62 (*)     All other components within normal limits  D-DIMER, QUANTITATIVE - Abnormal; Notable for the following:    D-Dimer, Quant 0.74 (*)     All other components within normal limits  TROPONIN I   Dg Chest 2 View  05/03/2012  *RADIOLOGY REPORT*  Clinical Data: Chest pain, shortness of breath.  CHEST - 2 VIEW  Comparison: 03/26/2012 CT  Findings: Heart size upper normal.  Pulmonary arterial prominence. Mild bibasilar opacities.  No definite pleural effusion or pneumothorax.  Limited osseous bowel evaluation without definite acute process  IMPRESSION: Mild lung base opacities; atelectasis (favored) versus infiltrate.  Pulmonary arterial  hypertension.   Original Report Authenticated By: Carlos Levering, M.D.      1. Chest pain   2. Chronic anticoagulation   3. Greenfield filter in place   4. Shortness of breath   5. Diabetes   6. LBBB (left bundle branch block)   7. Chronic renal insufficiency, stage IV (severe) - due to DM & HTN, SCr improving-1.6   8. Morbid obesity, (no history of sleep apnea evaluation)   9. HTN (hypertension)   10. HLD (hyperlipidemia)       MDM  [name] is a 57 y.o. female resents the emergency department concerned about her heart and about blood clots in her lungs she is history of triple-vessel disease seen on prior CT as well as prior pulmonary embolisms despite having an IVC filter and is currently on Coumadin.  Initial EKG is unremarkable shows an intraventricular conduction delay which was seen previously. Troponin is negative.  Chemistries again show renal failure patient has a GFR of 23 - with a slight increase in BUN and creatinine,  potassium is only marginally low at 3.1 and her other chemistries are unremarkable. Patient does have an elevation in her white count at 13.2, chronically anemic at 10.9 and 33.4. Normal platelet count. She is super therapeutic with her INR being 3.62. D-dimer is also elevated at 0.74 - will need to obtain a VQ scan secondary to her marginal renal function.  Reason in obtaining a VQ scan in an anticoagulated patient with a Greenfield filter, is for patient's symptoms, no right heart strain seen on EKG.  05/03/2012 8:51 AM Patient's VQ scan returned with low probability for PE - this makes the patient feel better as she has had PEs previously with IVC filter and on Coumadin.  Based on the patient's history, context of her chest pain being pressure-like, I don't think this patient is safe for a emergency department rule out with multiple sets of troponins. Based on her long history of diabetes, hypertension hyperlipidemia, known three-vessel  calcification, and unfortunately unknown coronary anatomy - I think she will require admission.  Patient does have a negative stress test in May, I do not think this is applicable to her current presentation.         Rhunette Croft, MD 05/03/12 0900

## 2012-05-03 NOTE — Progress Notes (Signed)
Utilization Review Completed.Donne Anon T1/27/2014

## 2012-05-03 NOTE — ED Notes (Signed)
To nuclear med via stretcher

## 2012-05-03 NOTE — ED Notes (Signed)
Report received, assumed care.  

## 2012-05-03 NOTE — H&P (Signed)
Pt. Seen and examined. Agree with the NP/PA-C note as written.  Well known 57 yo female with multiple medical problems and cardiac co-morbidities. She recent had stress test in 08/2011 in the setting of acute PE which was negative for ischemia.  Echo showed preserved LVEF of 65-70%. Chest CTPA demonstrated multivessel coronary calcification.  She now presents with episode of chest pressure and nausea, somewhat down her left arm. Nitroglycerin did provide some relief. Initial troponin is negative. INR therapeutic. V/Q scan is low-probability, although, d-dimer remains elevated. There is also a leukocytosis - ?UTI, with some urinary burning.  Hypokalemic.  Impression: 1. Chest pain - some features of unstable angina, but some atypical features as well 2. Leukocytosis 3. CKD IV 4. Anticoagulated on warfarin  Plan: 1. Hold warfarin - heparin gtts when INR <2.0. 2. Will likely need LHC, however, considerable risk for worsening renal function. 3. +OSA, however, has not been fitted a mask.  4. Replete K+ to >4.0 5. Agree with hydration. 6. UA/UCx, work-up for leukocytosis- given multiple co-morbidities, would appreciate hospital medicine assistance in management.  Pixie Casino, MD, [facility] Attending Cardiologist [facility]

## 2012-05-04 DIAGNOSIS — Z7901 Long term (current) use of anticoagulants: Secondary | ICD-10-CM

## 2012-05-04 DIAGNOSIS — Z86711 Personal history of pulmonary embolism: Secondary | ICD-10-CM

## 2012-05-04 DIAGNOSIS — R079 Chest pain, unspecified: Principal | ICD-10-CM

## 2012-05-04 DIAGNOSIS — R109 Unspecified abdominal pain: Secondary | ICD-10-CM | POA: Diagnosis present

## 2012-05-04 LAB — PROTIME-INR
INR: 3.78 — ABNORMAL HIGH (ref 0.00–1.49)
Prothrombin Time: 35.1 seconds — ABNORMAL HIGH (ref 11.6–15.2)

## 2012-05-04 LAB — CBC
HCT: 32.7 % — ABNORMAL LOW (ref 36.0–46.0)
MCH: 27.4 pg (ref 26.0–34.0)
MCHC: 31.5 g/dL (ref 30.0–36.0)
MCV: 87 fL (ref 78.0–100.0)
Platelets: 161 10*3/uL (ref 150–400)
RDW: 16.4 % — ABNORMAL HIGH (ref 11.5–15.5)
WBC: 11.6 10*3/uL — ABNORMAL HIGH (ref 4.0–10.5)

## 2012-05-04 LAB — BASIC METABOLIC PANEL
BUN: 88 mg/dL — ABNORMAL HIGH (ref 6–23)
Calcium: 9.4 mg/dL (ref 8.4–10.5)
Creatinine, Ser: 1.88 mg/dL — ABNORMAL HIGH (ref 0.50–1.10)
GFR calc Af Amer: 33 mL/min — ABNORMAL LOW (ref >90)
GFR calc non Af Amer: 29 mL/min — ABNORMAL LOW (ref >90)

## 2012-05-04 LAB — TSH: TSH: 1.155 u[IU]/mL (ref 0.350–4.500)

## 2012-05-04 LAB — LIPID PANEL
Cholesterol: 120 mg/dL (ref 0–200)
LDL Cholesterol: 56 mg/dL (ref 0–99)
Total CHOL/HDL Ratio: 2.6 RATIO
Triglycerides: 85 mg/dL (ref <150)
VLDL: 17 mg/dL (ref 0–40)

## 2012-05-04 MED ORDER — POTASSIUM CHLORIDE CRYS ER 20 MEQ PO TBCR: 40.0000 meq | EXTENDED_RELEASE_TABLET | Freq: Once | ORAL | Status: DC

## 2012-05-04 MED ORDER — INSULIN ASPART 100 UNIT/ML ~~LOC~~ SOLN
7.0000 [IU] | Freq: Three times a day (TID) | SUBCUTANEOUS | Status: DC
  Administered 2012-05-04: 7 [IU] via SUBCUTANEOUS
  Administered 2012-05-04: 12:00:00 via SUBCUTANEOUS
  Administered 2012-05-05 (×3): 7 [IU] via SUBCUTANEOUS

## 2012-05-04 MED ORDER — INSULIN ASPART 100 UNIT/ML ~~LOC~~ SOLN: 6.0000 [IU] | Freq: Three times a day (TID) | SUBCUTANEOUS | Status: DC

## 2012-05-04 MED ORDER — POTASSIUM CHLORIDE CRYS ER 20 MEQ PO TBCR
40.0000 meq | EXTENDED_RELEASE_TABLET | ORAL | Status: AC
  Administered 2012-05-04: 40 meq via ORAL
  Filled 2012-05-04: qty 2

## 2012-05-04 MED ORDER — INSULIN GLARGINE 100 UNIT/ML ~~LOC~~ SOLN
25.0000 [IU] | Freq: Every day | SUBCUTANEOUS | Status: DC
  Administered 2012-05-04 – 2012-05-06 (×3): 25 [IU] via SUBCUTANEOUS

## 2012-05-04 MED ORDER — TROLAMINE SALICYLATE 10 % EX CREA
TOPICAL_CREAM | Freq: Three times a day (TID) | CUTANEOUS | Status: DC
  Filled 2012-05-04: qty 85

## 2012-05-04 MED ORDER — MUSCLE RUB 10-15 % EX CREA
TOPICAL_CREAM | Freq: Three times a day (TID) | CUTANEOUS | Status: DC
  Administered 2012-05-04 – 2012-05-10 (×20): via TOPICAL
  Filled 2012-05-04: qty 85

## 2012-05-04 MED ORDER — GLIMEPIRIDE 2 MG PO TABS
2.0000 mg | ORAL_TABLET | Freq: Every day | ORAL | Status: DC
  Administered 2012-05-05 – 2012-05-06 (×2): 2 mg via ORAL
  Filled 2012-05-04 (×3): qty 1

## 2012-05-04 NOTE — Progress Notes (Signed)
ANTICOAGULATION CONSULT NOTE - Follow up [place] for heparin Indication: chest pain/ACS  Patient Measurements: Height: 5\' 3"  (160 cm) Weight: 252 lb 10.4 oz (114.6 kg) IBW/kg (Calculated) : 52.4   Vital Signs: Temp: 98.9 F (37.2 C) (01/28 1149) Temp src: Oral (01/28 1149) BP: 138/58 mmHg (01/28 1149) Pulse Rate: 61  (01/28 1149)  Labs:  Basename 05/04/12 0200 05/03/12 1957 05/03/12 1435 05/03/12 0525  HGB 10.3* -- -- 10.9*  HCT 32.7* -- -- 33.4*  PLT 161 -- -- 161  APTT -- -- -- --  LABPROT 35.1* -- -- 34.0*  INR 3.78* -- -- 3.62*  HEPARINUNFRC -- -- -- --  CREATININE 1.88* -- -- 2.28*  CKTOTAL -- -- -- --  CKMB -- -- -- --  TROPONINI <0.30 <0.30 <0.30 --    Estimated Creatinine Clearance: 40.8 ml/min (by C-G formula based on Cr of 1.88).  Assessment: 38 yof on chronic coumadin for hx of PE, DVT and afib to begin on IV heparin for CP when INR<2.1. Today INR is supratherapeutic at 3.62. No bleeding noted. H/H is slightly low, plts are WNL.   Goal of Therapy:  Heparin level 0.3-0.7 units/ml Monitor platelets by anticoagulation protocol: Yes   Plan:  1. No heparin for now - initiate when INR<2.1 2. Daily INR  Georgina Peer 05/04/2012,2:13 PM

## 2012-05-04 NOTE — Progress Notes (Signed)
[facility] PROGRESS NOTE  [name] [id number] DOB: January 14, 1956 DOA: 05/03/2012 PCP: [name], MD  Brief narrative: 57 year old woman complex past medical history presented emergency department today with chest pain and shortness of breath. She was admitted by the cardiology service. Urinalysis was noted to be abnormal and chest x-ray revealed atelectasis versus infiltrate. [facility] group was asked to comment on these 2 issues.  The patient reports several days of dysuria and frequency but no hesitancy. She also reports a minimal amount of cough for the last several days. She has had some shortness of breath which she associates with chest pain.  Urinalysis grossly positive. Chest x-ray atelectasis versus infiltrate.   Assessment/Plan: Principal Problem:  *Chest pain, rule out cardiac, negative myoview in the past Per cardiology  Active Problems:  Left upper abdominal/flank pain - Most likely musculoskeletal - cont narcotics, also on baby ASA- hesitant to start NSAIDs due to ASA and elevated INR - will order Aspercreme and K pad - avoid activity which exacerbates pain.   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 -Sugars moderately elevated - increase Lantus to help with Am hyperglycemia and mealtime insulin 7 units to lower mealtime sugars- takes 10 U of Apidra prior to meals per med rec - resume Amaryl   Morbid obesity, + sleep apnea   HTN (hypertension) -Reasonable control   History of pulmonary embolism, recurrent, 3 seperate episodes/ DVT, bil in 08/2011  Presence of IVC filter, placed June 2011 after 3d PE -On Coumadin- holding due to plans for cath- start Heparin when INR < 2.0 -VQ performed yesterday was  normal   Pulmonary HTN, Moderate  PA pressure [id number] -Diastolic dysfunction, grade 2, EF 65-70% May 2013 -Cont B blocker   LBBB (left bundle branch block)   Chronic renal insufficiency, stage IV (severe) - due to DM & HTN -On IVF at 50 cc/hr - GFR slightly improved to 29 today - on Lasix and Zaroxolyn at home  Code Status: full code Disposition Plan: per cardiology DVT prophylaxis: on Coumadin- therapeutic   Antibiotics:  Rocephin - 1/27>>  HPI/Subjective: Pt c/o severe pain in upper abdomen (points to epigastrium) radiating to left lower ribcage and back. Somewhat better after Vicodin- ongoing for 2 wks on and off but severe for few days. Does not recall injury or fall. No c/o nausea, vomiting or diarrhea. Has been constipated for 3 days despite eating well. She has problems with lower back pain but this abdominal pain is not related.   Objective: Filed Vitals:   05/04/12 0125 05/04/12 0405 05/04/12 0500 05/04/12 0745  BP:  118/54  145/58  Pulse: 58 83  76  Temp:  98.3 F (36.8 C)  98.7 F (37.1 C)  TempSrc:  Oral  Oral  Resp: 16 17  15   Height:      Weight:   114.6 kg (252 lb 10.4 oz)   SpO2: 97% 96%  94%    Intake/Output Summary (Last 24 hours) at 05/04/12 [id number] Last data filed at 05/04/12 0800  Gross per 24 hour  Intake   1390 ml  Output    400 ml  Net    990 ml    Exam:   General:  Morbidly obese, alert, oriented x 3-   Cardiovascular: RRR, no murmurs  Respiratory: CTA  b/l   Abdomen: soft, BS+, Non-distended, tender in epigastrium but much more tender in LUL and adjacent flank, pain noted to be exacerbated when asked to turn to the right- pt moves her left arm to hold on to bed rail on the right and is yelling with pain which she states is mostly in left upper abdomen and flank  Ext: no c/c/e  Data Reviewed: Basic Metabolic Panel:  Lab [id number] 0200 05/03/12 1432 05/03/12 0525  NA 133* -- 137  K 3.2* -- 3.1*  CL 91* -- 89*  CO2 33* -- 32   GLUCOSE 189* -- 133*  BUN 88* -- 99*  CREATININE 1.88* -- 2.28*  CALCIUM 9.4 -- 9.6  MG -- 2.6* --  PHOS -- -- --   Liver Function Tests:  Lab 05/03/12 0525  AST 17  ALT 12  ALKPHOS 74  BILITOT 0.6  PROT 7.6  ALBUMIN 3.4*   No results found for this basename: LIPASE:5,AMYLASE:5 in the last 168 hours No results found for this basename: AMMONIA:5 in the last 168 hours CBC:  Lab 05/04/12 0200 05/03/12 0525  WBC 11.6* 13.2*  NEUTROABS -- 11.3*  HGB 10.3* 10.9*  HCT 32.7* 33.4*  MCV 87.0 86.8  PLT 161 161   Cardiac Enzymes:  Lab 05/04/12 0200 05/03/12 1957 05/03/12 1435 05/03/12 0530  CKTOTAL -- -- -- --  CKMB -- -- -- --  CKMBINDEX -- -- -- --  TROPONINI <0.30 <0.30 <0.30 <0.30   BNP (last 3 results)  Basename 05/03/12 1435 01/25/12 2241 09/01/11 0450  PROBNP 1589.0* 684.9* 652.4*   CBG:  Lab 05/04/12 0755 05/03/12 2138 05/03/12 1748 05/03/12 1550  GLUCAP 156* 200* 214* 195*    Recent Results (from the past 240 hour(s))  MRSA PCR SCREENING     Status: Normal   Collection Time   05/03/12  1:35 PM      Component Value Range Status Comment   MRSA by PCR NEGATIVE  NEGATIVE Final      Studies: Dg Chest 2 View  05/03/2012  *RADIOLOGY REPORT*  Clinical Data: Chest pain, shortness of breath.  CHEST - 2 VIEW  Comparison: 03/26/2012 CT  Findings: Heart size upper normal.  Pulmonary arterial prominence. Mild bibasilar opacities.  No definite pleural effusion or pneumothorax.  Limited osseous bowel evaluation without definite acute process  IMPRESSION: Mild lung base opacities; atelectasis (favored) versus infiltrate.  Pulmonary arterial hypertension.   Original Report Authenticated By: Carlos Levering, M.D.    Nm Pulmonary Perf And Vent  05/03/2012  *RADIOLOGY REPORT*  Clinical Data:  Shortness of breath  NM PULMONARY VENTILATION AND PERFUSION SCANS; ventilation study with re- breathing  Views:  Anterior and posterior:  Ventilation; anterior, posterior, right lateral,  left lateral, RPO, LAO, LPO, RAO:  Perfusion  Radiopharmaceutical: Xenon 133 gas:  Ventilation; technetium 57m macroaggregated albumin:  Perfusion  Dose:  10.0 mCi:  Ventilation; 6.0 mCi:  Perfusion  Route of administration:  Inhalation:  Ventilation; intravenous: Perfusion  Comparison:  Chest radiograph May 03, 2012  Findings: The ventilation study shows homogeneous and symmetric uptake of radiotracer with prompt washout bilaterally.  The perfusion study shows homogeneous and  symmetric uptake without appreciable perfusion defect.  There is no appreciable ventilation / perfusion mismatch.  IMPRESSION: Essentially normal ventilation and perfusion lung scans.  Very low probability of pulmonary embolus.   Original Report Authenticated By: Lowella Grip, M.D.     Scheduled Meds:   . aspirin EC  81 mg Oral Daily  .  atorvastatin  10 mg Oral q1800  . cefTRIAXone (ROCEPHIN)  IV  1 g Intravenous Q24H  . citalopram  20 mg Oral QHS  . insulin aspart  0-9 Units Subcutaneous TID WC  . insulin aspart  5 Units Subcutaneous TID WC  . insulin glargine  23 Units Subcutaneous QHS  . levothyroxine  25 mcg Oral Daily  . metoprolol  50 mg Oral BID  . multivitamin with minerals  1 tablet Oral Daily  . nitroGLYCERIN  0.5 inch Topical Q6H  . omega-3 acid ethyl esters  1 g Oral Daily  . pantoprazole  40 mg Oral Q1200  . potassium chloride  40 mEq Oral [id number]  . trolamine salicylate   Topical TID   Continuous Infusions:   . sodium chloride 50 mL/hr (05/03/12 1427)    ________________________________________________________________________  Time spent in minutes: 35 min    [facility] Pager [phone] If 8PM-8AM, please contact night-coverage at [email or web address], password [facility] 05/04/2012, 9:28 AM  LOS: 1 day

## 2012-05-04 NOTE — Progress Notes (Signed)
[facility]  DAILY PROGRESS NOTE   Subjective:  Continues to have vague left lower chest/upper abdominal pain. UA + for UTI, now on rocephin. Hypokalemic.  INR remains elevated.  Objective:  Temp:  [98.3 F (36.8 C)-99 F (37.2 C)] 98.7 F (37.1 C) (01/28 0745) Pulse Rate:  [56-83] 76  (01/28 0745) Resp:  [12-20] 15  (01/28 0745) BP: (100-152)/(32-68) 145/58 mmHg (01/28 0745) SpO2:  [90 %-100 %] 94 % (01/28 0745) FiO2 (%):  [2 %] 2 % (01/27 1600) Weight:  [114.6 kg (252 lb 10.4 oz)] 114.6 kg (252 lb 10.4 oz) (01/28 0500) Weight change: 0.747 kg (1 lb 10.4 oz)  Intake/Output from previous day: 01/27 0701 - 01/28 0700 In: 1340 [P.O.:490; I.V.:800; IV Piggyback:50] Out: 400 [Urine:400]  Intake/Output from this shift: Total I/O In: 50 [I.V.:50] Out: -   Medications: Current Facility-Administered Medications  Medication Dose Route Frequency Provider Last Rate Last Dose  . 0.9 %  sodium chloride infusion   Intravenous Continuous Cecilie Kicks, NP 50 mL/hr at 05/03/12 1427 50 mL/hr at 05/03/12 1427  . acetaminophen (TYLENOL) tablet 650 mg  650 mg Oral Q4H PRN Cecilie Kicks, NP      . ALPRAZolam Duanne Moron) tablet 0.25 mg  0.25 mg Oral BID PRN Cecilie Kicks, NP      . aspirin EC tablet 81 mg  81 mg Oral Daily Cecilie Kicks, NP   81 mg at 05/03/12 1510  . atorvastatin (LIPITOR) tablet 10 mg  10 mg Oral q1800 Cecilie Kicks, NP   10 mg at 05/03/12 1829  . cefTRIAXone (ROCEPHIN) 1 g in dextrose 5 % 50 mL IVPB  1 g Intravenous Q24H Samuella Cota, MD   1 g at 05/03/12 1617  . citalopram (CELEXA) tablet 20 mg  20 mg Oral QHS Cecilie Kicks, NP   20 mg at 05/03/12 2205  . diazepam (VALIUM) tablet 5 mg  5 mg Oral QHS PRN Cecilie Kicks, NP      . HYDROcodone-acetaminophen (NORCO/VICODIN) 5-325 MG per tablet 1 tablet  1 tablet Oral Q4H PRN Cecilie Kicks, NP   1 tablet at 05/04/12 0748  . HYDROmorphone (DILAUDID) injection 1 mg  1 mg Intravenous Q3H PRN Cecilie Kicks, NP   1  mg at 05/04/12 0128  . insulin aspart (novoLOG) injection 0-9 Units  0-9 Units Subcutaneous TID WC Cecilie Kicks, NP   3 Units at 05/03/12 1754  . insulin aspart (novoLOG) injection 5 Units  5 Units Subcutaneous TID WC Samuella Cota, MD   5 Units at 05/03/12 1753  . insulin glargine (LANTUS) injection 23 Units  23 Units Subcutaneous QHS Cecilie Kicks, NP   23 Units at 05/03/12 2205  . levothyroxine (SYNTHROID, LEVOTHROID) tablet 25 mcg  25 mcg Oral Daily Cecilie Kicks, NP   25 mcg at 05/03/12 1510  . metoprolol (LOPRESSOR) tablet 50 mg  50 mg Oral BID Cecilie Kicks, NP   50 mg at 05/03/12 2205  . multivitamin with minerals tablet 1 tablet  1 tablet Oral Daily Cecilie Kicks, NP   1 tablet at 05/03/12 1510  . nitroGLYCERIN (NITROGLYN) 2 % ointment 0.5 inch  0.5 inch Topical Q6H Cecilie Kicks, NP   0.5 inch at 05/03/12 1511  . nitroGLYCERIN (NITROSTAT) SL tablet 0.4 mg  0.4 mg Sublingual Q5 Min x 3 PRN Cecilie Kicks, NP      . omega-3 acid ethyl esters (LOVAZA) capsule 1 g  1 g Oral Daily Cecilie Kicks, NP  1 g at 05/03/12 1510  . ondansetron (ZOFRAN) injection 4 mg  4 mg Intravenous Q6H PRN Cecilie Kicks, NP      . pantoprazole (PROTONIX) EC tablet 40 mg  40 mg Oral Q1200 Cecilie Kicks, NP   40 mg at 05/03/12 1510  . zolpidem (AMBIEN) tablet 5 mg  5 mg Oral QHS PRN Cecilie Kicks, NP        Physical Exam: General appearance: alert and mild distress Neck: no adenopathy, no carotid bruit, no JVD, supple, symmetrical, trachea midline and thyroid not enlarged, symmetric, no tenderness/mass/nodules Lungs: clear to auscultation bilaterally Heart: regular rate and rhythm, S1, S2 normal, no murmur, click, rub or gallop Abdomen: mild TTP in LUQ Extremities: extremities normal, atraumatic, no cyanosis or edema Pulses: 2+ and symmetric  Lab Results: Results for orders placed during the hospital encounter of 05/03/12 (from the past 48 hour(s))  COMPREHENSIVE METABOLIC PANEL     Status: Abnormal   Collection  Time   05/03/12  5:25 AM      Component Value Range Comment   Sodium 137  135 - 145 mEq/L    Potassium 3.1 (*) 3.5 - 5.1 mEq/L    Chloride 89 (*) 96 - 112 mEq/L    CO2 32  19 - 32 mEq/L    Glucose, Bld 133 (*) 70 - 99 mg/dL    BUN 99 (*) 6 - 23 mg/dL    Creatinine, Ser 2.28 (*) 0.50 - 1.10 mg/dL    Calcium 9.6  8.4 - 10.5 mg/dL    Total Protein 7.6  6.0 - 8.3 g/dL    Albumin 3.4 (*) 3.5 - 5.2 g/dL    AST 17  0 - 37 U/L    ALT 12  0 - 35 U/L    Alkaline Phosphatase 74  39 - 117 U/L    Total Bilirubin 0.6  0.3 - 1.2 mg/dL    GFR calc non Af Amer 23 (*) >90 mL/min    GFR calc Af Amer 26 (*) >90 mL/min   CBC WITH DIFFERENTIAL     Status: Abnormal   Collection Time   05/03/12  5:25 AM      Component Value Range Comment   WBC 13.2 (*) 4.0 - 10.5 K/uL    RBC 3.85 (*) 3.87 - 5.11 MIL/uL    Hemoglobin 10.9 (*) 12.0 - 15.0 g/dL    HCT 33.4 (*) 36.0 - 46.0 %    MCV 86.8  78.0 - 100.0 fL    MCH 28.3  26.0 - 34.0 pg    MCHC 32.6  30.0 - 36.0 g/dL    RDW 16.3 (*) 11.5 - 15.5 %    Platelets 161  150 - 400 K/uL    Neutrophils Relative 85 (*) 43 - 77 %    Neutro Abs 11.3 (*) 1.7 - 7.7 K/uL    Lymphocytes Relative 7 (*) 12 - 46 %    Lymphs Abs 0.9  0.7 - 4.0 K/uL    Monocytes Relative 7  3 - 12 %    Monocytes Absolute 1.0  0.1 - 1.0 K/uL    Eosinophils Relative 1  0 - 5 %    Eosinophils Absolute 0.1  0.0 - 0.7 K/uL    Basophils Relative 0  0 - 1 %    Basophils Absolute 0.0  0.0 - 0.1 K/uL   PROTIME-INR     Status: Abnormal   Collection Time   05/03/12  5:25 AM  Component Value Range Comment   Prothrombin Time 34.0 (*) 11.6 - 15.2 seconds    INR 3.62 (*) 0.00 - 1.49   D-DIMER, QUANTITATIVE     Status: Abnormal   Collection Time   05/03/12  5:25 AM      Component Value Range Comment   D-Dimer, Quant 0.74 (*) 0.00 - 0.48 ug/mL-FEU   TROPONIN I     Status: Normal   Collection Time   05/03/12  5:30 AM      Component Value Range Comment   Troponin I <0.30  <0.30 ng/mL   URINALYSIS,  ROUTINE W REFLEX MICROSCOPIC     Status: Abnormal   Collection Time   05/03/12 12:45 PM      Component Value Range Comment   Color, Urine YELLOW  YELLOW    APPearance CLOUDY (*) CLEAR    Specific Gravity, Urine 1.013  1.005 - 1.030    pH 5.5  5.0 - 8.0    Glucose, UA NEGATIVE  NEGATIVE mg/dL    Hgb urine dipstick LARGE (*) NEGATIVE    Bilirubin Urine NEGATIVE  NEGATIVE    Ketones, ur NEGATIVE  NEGATIVE mg/dL    Protein, ur 30 (*) NEGATIVE mg/dL    Urobilinogen, UA 1.0  0.0 - 1.0 mg/dL    Nitrite NEGATIVE  NEGATIVE    Leukocytes, UA LARGE (*) NEGATIVE   URINE MICROSCOPIC-ADD ON     Status: Abnormal   Collection Time   05/03/12 12:45 PM      Component Value Range Comment   WBC, UA TOO NUMEROUS TO COUNT  <3 WBC/hpf    RBC / HPF 7-10  <3 RBC/hpf    Bacteria, UA MANY (*) RARE   MRSA PCR SCREENING     Status: Normal   Collection Time   05/03/12  1:35 PM      Component Value Range Comment   MRSA by PCR NEGATIVE  NEGATIVE   TSH     Status: Normal   Collection Time   05/03/12  2:32 PM      Component Value Range Comment   TSH 1.155  0.350 - 4.500 uIU/mL   MAGNESIUM     Status: Abnormal   Collection Time   05/03/12  2:32 PM      Component Value Range Comment   Magnesium 2.6 (*) 1.5 - 2.5 mg/dL   HEMOGLOBIN A1C     Status: Abnormal   Collection Time   05/03/12  2:32 PM      Component Value Range Comment   Hemoglobin A1C 6.8 (*) <5.7 %    Mean Plasma Glucose 148 (*) <117 mg/dL   TROPONIN I     Status: Normal   Collection Time   05/03/12  2:35 PM      Component Value Range Comment   Troponin I <0.30  <0.30 ng/mL   PRO B NATRIURETIC PEPTIDE     Status: Abnormal   Collection Time   05/03/12  2:35 PM      Component Value Range Comment   Pro B Natriuretic peptide (BNP) 1589.0 (*) 0 - 125 pg/mL   GLUCOSE, CAPILLARY     Status: Abnormal   Collection Time   05/03/12  3:50 PM      Component Value Range Comment   Glucose-Capillary 195 (*) 70 - 99 mg/dL   GLUCOSE, CAPILLARY     Status:  Abnormal   Collection Time   05/03/12  5:48 PM      Component Value Range  Comment   Glucose-Capillary 214 (*) 70 - 99 mg/dL   TROPONIN I     Status: Normal   Collection Time   05/03/12  7:57 PM      Component Value Range Comment   Troponin I <0.30  <0.30 ng/mL   GLUCOSE, CAPILLARY     Status: Abnormal   Collection Time   05/03/12  9:38 PM      Component Value Range Comment   Glucose-Capillary 200 (*) 70 - 99 mg/dL    Comment 1 Documented in Chart      Comment 2 Notify RN     TROPONIN I     Status: Normal   Collection Time   05/04/12  2:00 AM      Component Value Range Comment   Troponin I <0.30  <0.30 ng/mL   CBC     Status: Abnormal   Collection Time   05/04/12  2:00 AM      Component Value Range Comment   WBC 11.6 (*) 4.0 - 10.5 K/uL    RBC 3.76 (*) 3.87 - 5.11 MIL/uL    Hemoglobin 10.3 (*) 12.0 - 15.0 g/dL    HCT 32.7 (*) 36.0 - 46.0 %    MCV 87.0  78.0 - 100.0 fL    MCH 27.4  26.0 - 34.0 pg    MCHC 31.5  30.0 - 36.0 g/dL    RDW 16.4 (*) 11.5 - 15.5 %    Platelets 161  150 - 400 K/uL   BASIC METABOLIC PANEL     Status: Abnormal   Collection Time   05/04/12  2:00 AM      Component Value Range Comment   Sodium 133 (*) 135 - 145 mEq/L    Potassium 3.2 (*) 3.5 - 5.1 mEq/L    Chloride 91 (*) 96 - 112 mEq/L    CO2 33 (*) 19 - 32 mEq/L    Glucose, Bld 189 (*) 70 - 99 mg/dL    BUN 88 (*) 6 - 23 mg/dL    Creatinine, Ser 1.88 (*) 0.50 - 1.10 mg/dL    Calcium 9.4  8.4 - 10.5 mg/dL    GFR calc non Af Amer 29 (*) >90 mL/min    GFR calc Af Amer 33 (*) >90 mL/min   PROTIME-INR     Status: Abnormal   Collection Time   05/04/12  2:00 AM      Component Value Range Comment   Prothrombin Time 35.1 (*) 11.6 - 15.2 seconds    INR 3.78 (*) 0.00 - 1.49   LIPID PANEL     Status: Normal   Collection Time   05/04/12  2:00 AM      Component Value Range Comment   Cholesterol 120  0 - 200 mg/dL    Triglycerides 85  <150 mg/dL    HDL 47  >39 mg/dL    Total CHOL/HDL Ratio 2.6      VLDL 17   0 - 40 mg/dL    LDL Cholesterol 56  0 - 99 mg/dL   GLUCOSE, CAPILLARY     Status: Abnormal   Collection Time   05/04/12  7:55 AM      Component Value Range Comment   Glucose-Capillary 156 (*) 70 - 99 mg/dL     Imaging: Dg Chest 2 View  05/03/2012  *RADIOLOGY REPORT*  Clinical Data: Chest pain, shortness of breath.  CHEST - 2 VIEW  Comparison: 03/26/2012 CT  Findings: Heart size upper normal.  Pulmonary arterial prominence. Mild bibasilar opacities.  No definite pleural effusion or pneumothorax.  Limited osseous bowel evaluation without definite acute process  IMPRESSION: Mild lung base opacities; atelectasis (favored) versus infiltrate.  Pulmonary arterial hypertension.   Original Report Authenticated By: Carlos Levering, M.D.    Nm Pulmonary Perf And Vent  05/03/2012  *RADIOLOGY REPORT*  Clinical Data:  Shortness of breath  NM PULMONARY VENTILATION AND PERFUSION SCANS; ventilation study with re- breathing  Views:  Anterior and posterior:  Ventilation; anterior, posterior, right lateral, left lateral, RPO, LAO, LPO, RAO:  Perfusion  Radiopharmaceutical: Xenon 133 gas:  Ventilation; technetium 71m macroaggregated albumin:  Perfusion  Dose:  10.0 mCi:  Ventilation; 6.0 mCi:  Perfusion  Route of administration:  Inhalation:  Ventilation; intravenous: Perfusion  Comparison:  Chest radiograph May 03, 2012  Findings: The ventilation study shows homogeneous and symmetric uptake of radiotracer with prompt washout bilaterally.  The perfusion study shows homogeneous and  symmetric uptake without appreciable perfusion defect.  There is no appreciable ventilation / perfusion mismatch.  IMPRESSION: Essentially normal ventilation and perfusion lung scans.  Very low probability of pulmonary embolus.   Original Report Authenticated By: Lowella Grip, M.D.     Assessment:  1. Principal Problem: 2.  *Chest pain, rule out cardiac, negative myoview in the past 3. Active Problems: 4.  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  Morbid obesity, + sleep apnea 6.  HTN (hypertension) 7.  HLD (hyperlipidemia) 8.  Anemia - due to GI bleed & chronic disease. 9.  History of pulmonary embolism, recurrent, 3 seperate episodes. 10.  Presence of IVC filter, placed June 2011 after 3d PE 11.  Pulmonary HTN, Moderate  PA pressure 42mmHG 09/2009 12.  Diastolic dysfunction, grade 2, EF 65-70% May 2013 13.  LBBB (left bundle branch block) 14.  Chronic renal insufficiency, stage IV (severe) - due to DM & HTN, SCr improving-1.6 15.  DVT, bil in 08/2011 16.  Anticoagulated on Coumadin, chronically 17.  Sleep apnea, has not started titration  18.  UTI (lower urinary tract infection) 19.  UTI (urinary tract infection) 20.   Plan:  1. Replete K+ today. Continue to hold warfarin and let INR drift - heparin if INR <2.0 given history of PE. Agree with IVF's - creatinine has improved, slightly more hyponatremic today.  Troponin negative x 3.  Less suspicious that this is cardiac chest pain, but will ultimately need LHC to prove this when INR <1.5.   Appreciate medicine assistance. She is very tender over the mid-epigastrium / pancreatic area. Lipase is negative, LFT's normal, s/p chole. She is on protonix.  ?need for abdominal CT or ultrasound.  She is also having low back pain, ?radiculopathy.  Time Spent Directly with Patient:  15 minutes  Length of Stay:  LOS: 1 day   Pixie Casino, MD, [facility] Attending Cardiologist [facility] 05/04/2012, 8:28 AM

## 2012-05-05 DIAGNOSIS — I519 Heart disease, unspecified: Secondary | ICD-10-CM

## 2012-05-05 LAB — URINE CULTURE

## 2012-05-05 LAB — CBC
Hemoglobin: 9.9 g/dL — ABNORMAL LOW (ref 12.0–15.0)
MCH: 27.3 pg (ref 26.0–34.0)
MCHC: 30.9 g/dL (ref 30.0–36.0)
MCV: 88.2 fL (ref 78.0–100.0)
RBC: 3.63 MIL/uL — ABNORMAL LOW (ref 3.87–5.11)

## 2012-05-05 LAB — BASIC METABOLIC PANEL
BUN: 73 mg/dL — ABNORMAL HIGH (ref 6–23)
BUN: 70 mg/dL — ABNORMAL HIGH (ref 6–23)
CO2: 33 mEq/L — ABNORMAL HIGH (ref 19–32)
Calcium: 9.6 mg/dL (ref 8.4–10.5)
Calcium: 9.3 mg/dL (ref 8.4–10.5)
Creatinine, Ser: 1.6 mg/dL — ABNORMAL HIGH (ref 0.50–1.10)
Creatinine, Ser: 1.68 mg/dL — ABNORMAL HIGH (ref 0.50–1.10)
GFR calc Af Amer: 38 mL/min — ABNORMAL LOW (ref >90)
GFR calc non Af Amer: 35 mL/min — ABNORMAL LOW (ref >90)
GFR calc non Af Amer: 33 mL/min — ABNORMAL LOW (ref >90)
Glucose, Bld: 164 mg/dL — ABNORMAL HIGH (ref 70–99)
Glucose, Bld: 154 mg/dL — ABNORMAL HIGH (ref 70–99)

## 2012-05-05 LAB — PROTIME-INR
INR: 3.17 — ABNORMAL HIGH (ref 0.00–1.49)
Prothrombin Time: 30.8 seconds — ABNORMAL HIGH (ref 11.6–15.2)

## 2012-05-05 LAB — GLUCOSE, CAPILLARY: Glucose-Capillary: 150 mg/dL — ABNORMAL HIGH (ref 70–99)

## 2012-05-05 MED ORDER — GABAPENTIN 300 MG PO CAPS
300.0000 mg | ORAL_CAPSULE | Freq: Two times a day (BID) | ORAL | Status: DC
  Administered 2012-05-05 – 2012-05-11 (×13): 300 mg via ORAL
  Filled 2012-05-05 (×16): qty 1

## 2012-05-05 MED ORDER — WHITE PETROLATUM GEL
Status: AC
  Administered 2012-05-05: 08:00:00
  Filled 2012-05-05: qty 5

## 2012-05-05 MED ORDER — ISOSORBIDE MONONITRATE ER 30 MG PO TB24
30.0000 mg | ORAL_TABLET | Freq: Every day | ORAL | Status: DC
  Administered 2012-05-05 – 2012-05-11 (×7): 30 mg via ORAL
  Filled 2012-05-05 (×7): qty 1

## 2012-05-05 MED ORDER — OXYCODONE HCL 5 MG PO TABS
5.0000 mg | ORAL_TABLET | ORAL | Status: DC | PRN
  Administered 2012-05-07 – 2012-05-08 (×3): 10 mg via ORAL
  Administered 2012-05-08: 5 mg via ORAL
  Administered 2012-05-10 (×3): 10 mg via ORAL
  Filled 2012-05-05: qty 1
  Filled 2012-05-05 (×6): qty 2

## 2012-05-05 MED ORDER — INSULIN ASPART 100 UNIT/ML ~~LOC~~ SOLN
0.0000 [IU] | Freq: Every day | SUBCUTANEOUS | Status: DC
  Administered 2012-05-08: 4 [IU] via SUBCUTANEOUS
  Administered 2012-05-10: 2 [IU] via SUBCUTANEOUS

## 2012-05-05 MED ORDER — WARFARIN SODIUM 2.5 MG PO TABS
2.5000 mg | ORAL_TABLET | Freq: Once | ORAL | Status: AC
  Administered 2012-05-05: 2.5 mg via ORAL
  Filled 2012-05-05 (×2): qty 1

## 2012-05-05 MED ORDER — INSULIN ASPART 100 UNIT/ML ~~LOC~~ SOLN
0.0000 [IU] | Freq: Three times a day (TID) | SUBCUTANEOUS | Status: DC
  Administered 2012-05-05: 4 [IU] via SUBCUTANEOUS

## 2012-05-05 MED ORDER — WARFARIN - PHARMACIST DOSING INPATIENT
Freq: Every day | Status: DC
  Administered 2012-05-06: 18:00:00

## 2012-05-05 NOTE — Progress Notes (Addendum)
ANTICOAGULATION CONSULT NOTE - Follow up [place] for heparin Indication: chest pain/ACS, hx of multiple PE's with IVC in place  Patient Measurements: Height: 5\' 3"  (160 cm) Weight: 255 lb 4.7 oz (115.8 kg) IBW/kg (Calculated) : 52.4   Vital Signs: Temp: 99.5 F (37.5 C) (01/29 0744) Temp src: Oral (01/29 0744) BP: 122/54 mmHg (01/29 0744) Pulse Rate: 68  (01/29 0744)  Labs:  Basename 05/05/12 0630 05/04/12 0200 05/03/12 1957 05/03/12 1435 05/03/12 0525  HGB 9.9* 10.3* -- -- --  HCT 32.0* 32.7* -- -- 33.4*  PLT 163 161 -- -- 161  APTT -- -- -- -- --  LABPROT 30.8* 35.1* -- -- 34.0*  INR 3.17* 3.78* -- -- 3.62*  HEPARINUNFRC -- -- -- -- --  CREATININE 1.60* 1.88* -- -- 2.28*  CKTOTAL -- -- -- -- --  CKMB -- -- -- -- --  TROPONINI -- <0.30 <0.30 <0.30 --    Estimated Creatinine Clearance: 48.2 ml/min (by C-G formula based on Cr of 1.6).  Assessment: 22 yof on chronic coumadin for hx of PE, DVT. Today INR is supratherapeutic at 3.1 now trending down. No bleeding noted. H/H is slightly low, plts are WNL.   Cardiac workup appears complete as patient has ruled out for MI. INR trending down and will likely be within goal tomorrow, will give low dose of warfarin tonight.  Goal of Therapy:  INR goal 2-3 Monitor platelets by anticoagulation protocol: Yes   Plan:  1. Warfarin 2.5mg  tonight 2. Daily INR  Georgina Peer 05/05/2012,10:44 AM

## 2012-05-05 NOTE — Progress Notes (Signed)
[facility] Progress Note - Assumption of Care  [place] TEAM 1 - Stepdown/ICU TEAM   [name] [id number] DOB: 03-05-1956 DOA: 05/03/2012 PCP: [name], MD  Brief narrative: 57 year old woman with complex past medical history presented emergency department with chest pain and shortness of breath. She was admitted by the Cardiology service. Urinalysis was noted to be abnormal and chest x-ray revealed atelectasis versus infiltrate. [facility] was asked to consult on these 2 issues.  The patient reported several days of dysuria and frequency but no hesitancy. She also reported a minimal amount of cough for the last several days. She had some shortness of breath which she associated with chest pain.  Urinalysis was grossly positive. Chest x-ray revealed atelectasis versus infiltrate.  Cardiology has completed their w/u, and find no active cardiac issues at this time.  The pt has begun to c/o severe abdom pain which extends across her abdom.  The Cardiology team has asked [facility] to consider assuming the attending role for this pt.  [facility] agrees that transfer to our service is reasonable.  I have changed EPIC to reflect this change.  Assessment/Plan:  Chest pain, rule out cardiac, negative myoview in the past  W/u completed per Cardiology  Epigastric/abdom pain  etiology unclear - pt has numerous complaints that are hard to reconcile w/ exam - will check lipase - she has had a cholecystectomy   E coli UTI - pansensitive Perhaps this is contributing to her abdom discomfort - cont IV abx for 24hrs additional then transition to oral meds as able   Abnormal CXR  suspect atelectasis - possibly has been splinting due to abdominal/flank pain - no clinical sx to suggest PNA  Hypokalemia  Resolved w/ replacement  Insulin dependent diabetes mellitus - with Proteinuria  CBG improving w/ changes made yesterday - follow w/o change today   Morbid obesity + sleep apnea   HTN  reasonable  control at this time - follow trend   Chronic normocytic anemia No signif change in Hgb  History of pulmonary embolism, recurrent, 3 seperate episodes/ DVT, bil in 08/2011  Presence of IVC filter, placed June 2011 after 3d PE  on Coumadin and therapeutic - VQ performed was normal   Pulmonary HTN, Moderate PA pressure 37mmHG 09/2009  Well compensated at this time   LBBB (left bundle branch block)   Chronic renal insufficiency, stage IV (severe) - due to DM & HTN  IVF at 50 cc/hr - GFR continues to improve - appears to be developing mild volume overload - d/c IVF - may have to resume diuretic in AM (Lasix and Zaroxolyn at home)  Code Status: FULL Family Communication: spoke w/ pt  Disposition Plan: tele bed   Consultants: Cardiology - [facility]  Procedures: none  Antibiotics: Rocephin 1/27>>  DVT prophylaxis: coumadin  HPI/Subjective: Pt is asleep when I enter the room.  Upon waking she c/o severe epigastric pain as well as RUQ abdom pain.  This pain radiates up into her neck B, and into her L chest.  She denies n/v, ha, or subjective fever.   Objective: Blood pressure 130/99, pulse 68, temperature 98.1 F (36.7 C), temperature source Oral, resp. rate 20, height 5\' 3"  (1.6 m), weight 116.1 kg (255 lb 15.3 oz), SpO2 95.00%.  Intake/Output Summary (Last 24 hours) at 05/05/12 1503 Last data filed at 05/05/12 1401  Gross per 24 hour  Intake   2075 ml  Output   2000 ml  Net     75 ml  Exam: General: No acute respiratory distress Lungs: poor air movement th/o all fields - no wheeze - distant BS Cardiovascular: distant HS - no appreciable M,G,R Abdomen: morbidly obese, nontender, nondistended, soft, bowel sounds positive, no rebound, no ascites, no appreciable mass Extremities: No significant cyanosis, or clubbing;  1+ edema bilateral lower extremities  Data Reviewed: Basic Metabolic Panel:  Lab [id number] 0630 05/04/12 0200 05/03/12 1432 05/03/12 0525  NA 138 133* --  137  K 3.7 3.2* -- 3.1*  CL 94* 91* -- 89*  CO2 33* 33* -- 32  GLUCOSE 164* 189* -- 133*  BUN 73* 88* -- 99*  CREATININE 1.60* 1.88* -- 2.28*  CALCIUM 9.6 9.4 -- 9.6  MG -- -- 2.6* --  PHOS -- -- -- --   Liver Function Tests:  Lab 05/03/12 0525  AST 17  ALT 12  ALKPHOS 74  BILITOT 0.6  PROT 7.6  ALBUMIN 3.4*   CBC:  Lab 05/05/12 0630 05/04/12 0200 05/03/12 0525  WBC 11.3* 11.6* 13.2*  NEUTROABS -- -- 11.3*  HGB 9.9* 10.3* 10.9*  HCT 32.0* 32.7* 33.4*  MCV 88.2 87.0 86.8  PLT 163 161 161   Cardiac Enzymes:  Lab 05/04/12 0200 05/03/12 1957 05/03/12 1435 05/03/12 0530  CKTOTAL -- -- -- --  CKMB -- -- -- --  CKMBINDEX -- -- -- --  TROPONINI <0.30 <0.30 <0.30 <0.30   BNP (last 3 results)  Basename 05/03/12 1435 01/25/12 2241 09/01/11 0450  PROBNP 1589.0* 684.9* 652.4*   CBG:  Lab 05/05/12 1158 05/05/12 0745 05/04/12 2146 05/04/12 1638 05/04/12 1207  GLUCAP 126* 150* 151* 154* 161*    Recent Results (from the past 240 hour(s))  URINE CULTURE     Status: Normal   Collection Time   05/03/12 12:45 PM      Component Value Range Status Comment   Specimen Description URINE, CLEAN CATCH   Final    Special Requests NONE   Final    Culture  Setup Time 05/03/2012 13:33   Final    Colony Count >=100,000 COLONIES/ML   Final    Culture ESCHERICHIA COLI   Final    Report Status 05/05/2012 FINAL   Final    Organism ID, Bacteria ESCHERICHIA COLI   Final   MRSA PCR SCREENING     Status: Normal   Collection Time   05/03/12  1:35 PM      Component Value Range Status Comment   MRSA by PCR NEGATIVE  NEGATIVE Final      Studies:  Recent x-ray studies have been reviewed in detail by the Attending Physician  Scheduled Meds:  Reviewed in detail by the Attending Physician   Cherene Altes, MD [facility] Office  [phone] Pager [phone]  On-Call/Text Page:      [email or web address]      password [email or web address]  If 7PM-7AM, please contact  night-coverage [email or web address] Password [email or web address] 05/05/2012, 3:03 PM   LOS: 2 days

## 2012-05-05 NOTE — Progress Notes (Addendum)
[facility]  DAILY PROGRESS NOTE   Subjective:  Labs improved, creatinine is improving with hydration. On treatment for UTI. Troponin negative x 4. INR at 3.17.  Objective:  Temp:  [98 F (36.7 C)-99.7 F (37.6 C)] 99.5 F (37.5 C) (01/29 0744) Pulse Rate:  [58-68] 68  (01/29 0744) Resp:  [11-21] 19  (01/29 0744) BP: (122-153)/(51-64) 122/54 mmHg (01/29 0744) SpO2:  [92 %-98 %] 97 % (01/29 0744) Weight:  [115.8 kg (255 lb 4.7 oz)] 115.8 kg (255 lb 4.7 oz) (01/29 0615) Weight change: 1.2 kg (2 lb 10.3 oz)  Intake/Output from previous day: 01/28 0701 - 01/29 0700 In: 2525 [P.O.:1325; I.V.:1150; IV Piggyback:50] Out: 41 [Urine:1900]  Intake/Output from this shift: Total I/O In: 50 [I.V.:50] Out: -   Medications: Current Facility-Administered Medications  Medication Dose Route Frequency Provider Last Rate Last Dose  . 0.9 %  sodium chloride infusion   Intravenous Continuous Cecilie Kicks, NP 50 mL/hr at 05/05/12 [phone]    . acetaminophen (TYLENOL) tablet 650 mg  650 mg Oral Q4H PRN Cecilie Kicks, NP      . ALPRAZolam Duanne Moron) tablet 0.25 mg  0.25 mg Oral BID PRN Cecilie Kicks, NP      . aspirin EC tablet 81 mg  81 mg Oral Daily Cecilie Kicks, NP   81 mg at 05/04/12 [id number]  . atorvastatin (LIPITOR) tablet 10 mg  10 mg Oral q1800 Cecilie Kicks, NP   10 mg at 05/04/12 1738  . cefTRIAXone (ROCEPHIN) 1 g in dextrose 5 % 50 mL IVPB  1 g Intravenous Q24H Samuella Cota, MD   1 g at 05/04/12 1510  . citalopram (CELEXA) tablet 20 mg  20 mg Oral QHS Cecilie Kicks, NP   20 mg at 05/04/12 2300  . diazepam (VALIUM) tablet 5 mg  5 mg Oral QHS PRN Cecilie Kicks, NP   5 mg at 05/04/12 1041  . glimepiride (AMARYL) tablet 2 mg  2 mg Oral QAC breakfast Debbe Odea, MD      . HYDROcodone-acetaminophen (NORCO/VICODIN) 5-325 MG per tablet 1 tablet  1 tablet Oral Q4H PRN Cecilie Kicks, NP   1 tablet at 05/04/12 0748  . HYDROmorphone (DILAUDID) injection 1 mg  1 mg Intravenous  Q3H PRN Cecilie Kicks, NP   1 mg at 05/05/12 0543  . insulin aspart (novoLOG) injection 7 Units  7 Units Subcutaneous TID WC Debbe Odea, MD   7 Units at 05/05/12 0831  . insulin glargine (LANTUS) injection 25 Units  25 Units Subcutaneous QHS Debbe Odea, MD   25 Units at 05/04/12 2300  . levothyroxine (SYNTHROID, LEVOTHROID) tablet 25 mcg  25 mcg Oral Daily Cecilie Kicks, NP   25 mcg at 05/04/12 [id number]  . metoprolol (LOPRESSOR) tablet 50 mg  50 mg Oral BID Cecilie Kicks, NP   50 mg at 05/04/12 [id number]  . multivitamin with minerals tablet 1 tablet  1 tablet Oral Daily Cecilie Kicks, NP   1 tablet at 05/04/12 [phone]  . Muscle Rub CREA   Topical TID Pixie Casino, MD      . nitroGLYCERIN (NITROGLYN) 2 % ointment 0.5 inch  0.5 inch Topical Q6H Cecilie Kicks, NP   0.5 inch at 05/05/12 0543  . nitroGLYCERIN (NITROSTAT) SL tablet 0.4 mg  0.4 mg Sublingual Q5 Min x 3 PRN Cecilie Kicks, NP      . omega-3 acid ethyl esters (LOVAZA) capsule 1 g  1 g Oral Daily Cecilie Kicks, NP  1 g at 05/04/12 0928  . ondansetron (ZOFRAN) injection 4 mg  4 mg Intravenous Q6H PRN Cecilie Kicks, NP      . pantoprazole (PROTONIX) EC tablet 40 mg  40 mg Oral Q1200 Cecilie Kicks, NP   40 mg at 05/04/12 1449  . zolpidem (AMBIEN) tablet 5 mg  5 mg Oral QHS PRN Cecilie Kicks, NP        Physical Exam: General appearance: alert and mild distress Neck: no adenopathy, no carotid bruit, no JVD, supple, symmetrical, trachea midline and thyroid not enlarged, symmetric, no tenderness/mass/nodules Lungs: clear to auscultation bilaterally Heart: regular rate and rhythm, S1, S2 normal, no murmur, click, rub or gallop Abdomen: marked tenderness to palpation over the upper abdomen, without guarding or rebound Extremities: extremities normal, atraumatic, no cyanosis or edema Pulses: 2+ and symmetric  Lab Results: Results for orders placed during the hospital encounter of 05/03/12 (from the past 48 hour(s))  URINALYSIS, ROUTINE W REFLEX MICROSCOPIC      Status: Abnormal   Collection Time   05/03/12 12:45 PM      Component Value Range Comment   Color, Urine YELLOW  YELLOW    APPearance CLOUDY (*) CLEAR    Specific Gravity, Urine 1.013  1.005 - 1.030    pH 5.5  5.0 - 8.0    Glucose, UA NEGATIVE  NEGATIVE mg/dL    Hgb urine dipstick LARGE (*) NEGATIVE    Bilirubin Urine NEGATIVE  NEGATIVE    Ketones, ur NEGATIVE  NEGATIVE mg/dL    Protein, ur 30 (*) NEGATIVE mg/dL    Urobilinogen, UA 1.0  0.0 - 1.0 mg/dL    Nitrite NEGATIVE  NEGATIVE    Leukocytes, UA LARGE (*) NEGATIVE   URINE MICROSCOPIC-ADD ON     Status: Abnormal   Collection Time   05/03/12 12:45 PM      Component Value Range Comment   WBC, UA TOO NUMEROUS TO COUNT  <3 WBC/hpf    RBC / HPF 7-10  <3 RBC/hpf    Bacteria, UA MANY (*) RARE   URINE CULTURE     Status: Normal (Preliminary result)   Collection Time   05/03/12 12:45 PM      Component Value Range Comment   Specimen Description URINE, CLEAN CATCH      Special Requests NONE      Culture  Setup Time 05/03/2012 13:33      Colony Count >=100,000 COLONIES/ML      Culture ESCHERICHIA COLI      Report Status PENDING     MRSA PCR SCREENING     Status: Normal   Collection Time   05/03/12  1:35 PM      Component Value Range Comment   MRSA by PCR NEGATIVE  NEGATIVE   TSH     Status: Normal   Collection Time   05/03/12  2:32 PM      Component Value Range Comment   TSH 1.155  0.350 - 4.500 uIU/mL   MAGNESIUM     Status: Abnormal   Collection Time   05/03/12  2:32 PM      Component Value Range Comment   Magnesium 2.6 (*) 1.5 - 2.5 mg/dL   HEMOGLOBIN A1C     Status: Abnormal   Collection Time   05/03/12  2:32 PM      Component Value Range Comment   Hemoglobin A1C 6.8 (*) <5.7 %    Mean Plasma Glucose 148 (*) <117 mg/dL   TROPONIN I  Status: Normal   Collection Time   05/03/12  2:35 PM      Component Value Range Comment   Troponin I <0.30  <0.30 ng/mL   PRO B NATRIURETIC PEPTIDE     Status: Abnormal   Collection  Time   05/03/12  2:35 PM      Component Value Range Comment   Pro B Natriuretic peptide (BNP) 1589.0 (*) 0 - 125 pg/mL   GLUCOSE, CAPILLARY     Status: Abnormal   Collection Time   05/03/12  3:50 PM      Component Value Range Comment   Glucose-Capillary 195 (*) 70 - 99 mg/dL   GLUCOSE, CAPILLARY     Status: Abnormal   Collection Time   05/03/12  5:48 PM      Component Value Range Comment   Glucose-Capillary 214 (*) 70 - 99 mg/dL   TROPONIN I     Status: Normal   Collection Time   05/03/12  7:57 PM      Component Value Range Comment   Troponin I <0.30  <0.30 ng/mL   GLUCOSE, CAPILLARY     Status: Abnormal   Collection Time   05/03/12  9:38 PM      Component Value Range Comment   Glucose-Capillary 200 (*) 70 - 99 mg/dL    Comment 1 Documented in Chart      Comment 2 Notify RN     TROPONIN I     Status: Normal   Collection Time   05/04/12  2:00 AM      Component Value Range Comment   Troponin I <0.30  <0.30 ng/mL   CBC     Status: Abnormal   Collection Time   05/04/12  2:00 AM      Component Value Range Comment   WBC 11.6 (*) 4.0 - 10.5 K/uL    RBC 3.76 (*) 3.87 - 5.11 MIL/uL    Hemoglobin 10.3 (*) 12.0 - 15.0 g/dL    HCT 32.7 (*) 36.0 - 46.0 %    MCV 87.0  78.0 - 100.0 fL    MCH 27.4  26.0 - 34.0 pg    MCHC 31.5  30.0 - 36.0 g/dL    RDW 16.4 (*) 11.5 - 15.5 %    Platelets 161  150 - 400 K/uL   BASIC METABOLIC PANEL     Status: Abnormal   Collection Time   05/04/12  2:00 AM      Component Value Range Comment   Sodium 133 (*) 135 - 145 mEq/L    Potassium 3.2 (*) 3.5 - 5.1 mEq/L    Chloride 91 (*) 96 - 112 mEq/L    CO2 33 (*) 19 - 32 mEq/L    Glucose, Bld 189 (*) 70 - 99 mg/dL    BUN 88 (*) 6 - 23 mg/dL    Creatinine, Ser 1.88 (*) 0.50 - 1.10 mg/dL    Calcium 9.4  8.4 - 10.5 mg/dL    GFR calc non Af Amer 29 (*) >90 mL/min    GFR calc Af Amer 33 (*) >90 mL/min   PROTIME-INR     Status: Abnormal   Collection Time   05/04/12  2:00 AM      Component Value Range Comment    Prothrombin Time 35.1 (*) 11.6 - 15.2 seconds    INR 3.78 (*) 0.00 - 1.49   LIPID PANEL     Status: Normal   Collection Time   05/04/12  2:00 AM  Component Value Range Comment   Cholesterol 120  0 - 200 mg/dL    Triglycerides 85  <150 mg/dL    HDL 47  >39 mg/dL    Total CHOL/HDL Ratio 2.6      VLDL 17  0 - 40 mg/dL    LDL Cholesterol 56  0 - 99 mg/dL   GLUCOSE, CAPILLARY     Status: Abnormal   Collection Time   05/04/12  7:55 AM      Component Value Range Comment   Glucose-Capillary 156 (*) 70 - 99 mg/dL   GLUCOSE, CAPILLARY     Status: Abnormal   Collection Time   05/04/12 12:07 PM      Component Value Range Comment   Glucose-Capillary 161 (*) 70 - 99 mg/dL   GLUCOSE, CAPILLARY     Status: Abnormal   Collection Time   05/04/12  4:38 PM      Component Value Range Comment   Glucose-Capillary 154 (*) 70 - 99 mg/dL   GLUCOSE, CAPILLARY     Status: Abnormal   Collection Time   05/04/12  9:46 PM      Component Value Range Comment   Glucose-Capillary 151 (*) 70 - 99 mg/dL    Comment 1 Documented in Chart      Comment 2 Notify RN     PROTIME-INR     Status: Abnormal   Collection Time   05/05/12  6:30 AM      Component Value Range Comment   Prothrombin Time 30.8 (*) 11.6 - 15.2 seconds    INR 3.17 (*) 0.00 - 1.49   CBC     Status: Abnormal   Collection Time   05/05/12  6:30 AM      Component Value Range Comment   WBC 11.3 (*) 4.0 - 10.5 K/uL    RBC 3.63 (*) 3.87 - 5.11 MIL/uL    Hemoglobin 9.9 (*) 12.0 - 15.0 g/dL    HCT 32.0 (*) 36.0 - 46.0 %    MCV 88.2  78.0 - 100.0 fL    MCH 27.3  26.0 - 34.0 pg    MCHC 30.9  30.0 - 36.0 g/dL    RDW 16.3 (*) 11.5 - 15.5 %    Platelets 163  150 - 400 K/uL   BASIC METABOLIC PANEL     Status: Abnormal   Collection Time   05/05/12  6:30 AM      Component Value Range Comment   Sodium 138  135 - 145 mEq/L    Potassium 3.7  3.5 - 5.1 mEq/L    Chloride 94 (*) 96 - 112 mEq/L    CO2 33 (*) 19 - 32 mEq/L    Glucose, Bld 164 (*) 70 - 99 mg/dL     BUN 73 (*) 6 - 23 mg/dL    Creatinine, Ser 1.60 (*) 0.50 - 1.10 mg/dL    Calcium 9.6  8.4 - 10.5 mg/dL    GFR calc non Af Amer 35 (*) >90 mL/min    GFR calc Af Amer 41 (*) >90 mL/min     Imaging: No results found.  Assessment:  1. Principal Problem: 2.  *Chest pain, rule out cardiac, negative myoview in the past 3. Active Problems: 4.  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  Morbid obesity, + sleep apnea 6.  HTN (hypertension) 7.  HLD (hyperlipidemia) 8.  Anemia - due to GI bleed & chronic disease. 9.  History of pulmonary embolism, recurrent, 3  seperate episodes. 10.  Presence of IVC filter, placed June 2011 after 3d PE 11.  Pulmonary HTN, Moderate  PA pressure 30mmHG 09/2009 12.  Diastolic dysfunction, grade 2, EF 65-70% May 2013 13.  LBBB (left bundle branch block) 14.  Chronic renal insufficiency, stage IV (severe) - due to DM & HTN, SCr improving-1.6 15.  DVT, bil in 08/2011 16.  Anticoagulated on Coumadin, chronically 17.  Sleep apnea, has not started titration  18.  UTI (lower urinary tract infection) 19.  UTI (urinary tract infection) 20.  Abdominal pain 21.   Plan:  1. Main complaint is abdominal pain which extends across her abdomen and is painful out of proportion to the exam. She is also complaining of numerous other somatic complaints, none of which appear cardiac.  I suspect this is neuropathic pain, but I cannot find a clear source of her symptoms. There may be a psychiatric component to this as well.  Will add neurontin for presumed neuropathic pain.  Ok to transfer to the floor.  There are no clear cardiac issues at this time, would appreciate if hospital medicine could assume care for further work-up of somatic pain issues.   Time Spent Directly with Patient:  15 minutes  Length of Stay:  LOS: 2 days   Pixie Casino, MD, [facility] Attending Cardiologist [facility] 05/05/2012, 8:32  AM

## 2012-05-05 NOTE — Clinical Social Work Psychosocial (Signed)
     Clinical Social Work Department BRIEF PSYCHOSOCIAL ASSESSMENT 05/05/2012  Patient:  [name], [name]     Account Number:  [id number]     [place] date:  05/03/2012  Clinical Social Worker:  Otilio Saber  Date/Time:  05/05/2012 01:51 PM  Referred by:  RN  Date Referred:  05/03/2012 Referred for  Other - See comment   Other Referral:   "Medicaid certification"   Interview type:  Patient Other interview type:    PSYCHOSOCIAL DATA Living Status:  ALONE Admitted from facility:   Level of care:   Primary support name:  Juliann Pulse Haithcock Primary support relationship to patient:  [place] Degree of support available:   adequate    CURRENT CONCERNS Current Concerns  None Noted   Other Concerns:    SOCIAL WORK ASSESSMENT / PLAN Clinical Social Worker received referral for "medicaid certification." CSW introduced self and explained reason for visit. CSW inquired about the reason for the consult and patient reported, "I don't know, all I know is that I have medicaid." CSW inquired if there was a concern and patient reported no concern or needs. CSW will sign off, as social work intervention is no longer needed.   Assessment/plan status:  No Further Intervention Required Other assessment/ plan:   Information/referral to community resources:   Patient reported no needs or concern.    PATIENTS/FAMILYS RESPONSE TO PLAN OF CARE: Patient reported no concern or need regarding consult for medicaid certification.

## 2012-05-06 ENCOUNTER — Inpatient Hospital Stay (HOSPITAL_COMMUNITY): Payer: Medicaid Other

## 2012-05-06 DIAGNOSIS — I82409 Acute embolism and thrombosis of unspecified deep veins of unspecified lower extremity: Secondary | ICD-10-CM

## 2012-05-06 DIAGNOSIS — E785 Hyperlipidemia, unspecified: Secondary | ICD-10-CM

## 2012-05-06 DIAGNOSIS — I447 Left bundle-branch block, unspecified: Secondary | ICD-10-CM

## 2012-05-06 DIAGNOSIS — Z5181 Encounter for therapeutic drug level monitoring: Secondary | ICD-10-CM

## 2012-05-06 DIAGNOSIS — Z9889 Other specified postprocedural states: Secondary | ICD-10-CM

## 2012-05-06 DIAGNOSIS — I10 Essential (primary) hypertension: Secondary | ICD-10-CM

## 2012-05-06 LAB — GLUCOSE, CAPILLARY
Glucose-Capillary: 87 mg/dL (ref 70–99)
Glucose-Capillary: 55 mg/dL — ABNORMAL LOW (ref 70–99)
Glucose-Capillary: 84 mg/dL (ref 70–99)
Glucose-Capillary: 56 mg/dL — ABNORMAL LOW (ref 70–99)

## 2012-05-06 LAB — CBC
Hemoglobin: 8.7 g/dL — ABNORMAL LOW (ref 12.0–15.0)
MCH: 28 pg (ref 26.0–34.0)
MCV: 87.8 fL (ref 78.0–100.0)
Platelets: 160 10*3/uL (ref 150–400)
RBC: 3.11 MIL/uL — ABNORMAL LOW (ref 3.87–5.11)
WBC: 11.2 10*3/uL — ABNORMAL HIGH (ref 4.0–10.5)

## 2012-05-06 LAB — RETICULOCYTES
RBC.: 3.16 MIL/uL — ABNORMAL LOW (ref 3.87–5.11)
Retic Count, Absolute: 37.9 10*3/uL (ref 19.0–186.0)
Retic Ct Pct: 1.2 % (ref 0.4–3.1)

## 2012-05-06 LAB — LIPASE, BLOOD: Lipase: 15 U/L (ref 11–59)

## 2012-05-06 LAB — PROTIME-INR
INR: 2.23 — ABNORMAL HIGH (ref 0.00–1.49)
Prothrombin Time: 23.7 seconds — ABNORMAL HIGH (ref 11.6–15.2)

## 2012-05-06 MED ORDER — CEFAZOLIN SODIUM 1-5 GM-% IV SOLN
INTRAVENOUS | Status: AC
  Filled 2012-05-06: qty 100

## 2012-05-06 MED ORDER — WARFARIN SODIUM 10 MG PO TABS
12.5000 mg | ORAL_TABLET | Freq: Once | ORAL | Status: AC
  Administered 2012-05-06: 12.5 mg via ORAL
  Filled 2012-05-06: qty 1

## 2012-05-06 MED ORDER — ALPRAZOLAM 0.25 MG PO TABS
0.2500 mg | ORAL_TABLET | Freq: Two times a day (BID) | ORAL | Status: DC
  Administered 2012-05-06 – 2012-05-11 (×10): 0.25 mg via ORAL
  Filled 2012-05-06 (×10): qty 1

## 2012-05-06 MED ORDER — FUROSEMIDE 80 MG PO TABS
80.0000 mg | ORAL_TABLET | Freq: Every day | ORAL | Status: DC
  Administered 2012-05-06: 80 mg via ORAL
  Filled 2012-05-06 (×2): qty 1

## 2012-05-06 NOTE — Evaluation (Signed)
Physical Therapy Evaluation Patient Details Name: [name] MRN: [id number] DOB: 1955-07-16 Today's Date: 05/06/2012 Time: [id number] PT Time Calculation (min): 24 min  PT Assessment / Plan / Recommendation Clinical Impression  Pt admitted with CP, UTI and now with bil UE jerking and lack of control of movement. Pt familiar from prior hospitalization and desires to return to apt with 2 dogs but does not have caregiver and has to ambulate at least 200' to get into apatment from parking. Pt will benefit from acute therapy to maximize mobility, gait, transfers and safety prior to discharge. If pt unable to meet goals will need rehab prior to home for safety.     PT Assessment  Patient needs continued PT services    Follow Up Recommendations  Supervision for mobility/OOB;CIR    Does the patient have the potential to tolerate intense rehabilitation      Barriers to Discharge Decreased caregiver support      Equipment Recommendations  None recommended by PT    Recommendations for Other Services OT consult   Frequency Min 3X/week    Precautions / Restrictions Precautions Precautions: Fall Precaution Comments: pt with jerking of bil UE   Pertinent Vitals/Pain No pain      Mobility  Bed Mobility Bed Mobility: Rolling Right;Right Sidelying to Sit;Sitting - Scoot to Edge of Bed Rolling Right: 6: Modified independent (Device/Increase time);With rail Right Sidelying to Sit: 6: Modified independent (Device/Increase time);With rails;HOB elevated (HOB 20degrees) Sitting - Scoot to Edge of Bed: 6: Modified independent (Device/Increase time) Details for Bed Mobility Assistance: increased time with need for use of rail to achieve sitting Transfers Transfers: Sit to Stand;Stand to Sit Sit to Stand: 4: Min assist;From bed Stand to Sit: To chair/3-in-1;With armrests;4: Min assist Details for Transfer Assistance: cueing for hand placement and safety, pt pulling up on stabilized RW  despite cueing and required assist to complete elevation and to reach for armrests as well as back fully to surface Ambulation/Gait Ambulation/Gait Assistance: 4: Min assist Ambulation Distance (Feet): 30 Feet Assistive device: Rolling walker Ambulation/Gait Assistance Details: cueing for posture and position in RW, pt with difficulty keeping RW close due to lack of control of bil UE with jerking throughout gait Gait Pattern: Step-through pattern;Decreased stride length;Trunk flexed Gait velocity: decreased Stairs: No    Shoulder Instructions     Exercises     PT Diagnosis: Difficulty walking  PT Problem List: Decreased strength;Decreased activity tolerance;Decreased coordination;Decreased mobility;Decreased knowledge of use of DME;Obesity PT Treatment Interventions: Gait training;Functional mobility training;DME instruction;Therapeutic activities;Therapeutic exercise;Balance training;Patient/family education   PT Goals Acute Rehab PT Goals PT Goal Formulation: With patient Time For Goal Achievement: 05/20/12 Potential to Achieve Goals: Good Pt will go Supine/Side to Sit: with modified independence;with HOB 0 degrees PT Goal: Supine/Side to Sit - Progress: Goal set today Pt will go Sit to Supine/Side: with modified independence;with HOB 0 degrees PT Goal: Sit to Supine/Side - Progress: Goal set today Pt will go Sit to Stand: with modified independence PT Goal: Sit to Stand - Progress: Goal set today Pt will go Stand to Sit: with modified independence PT Goal: Stand to Sit - Progress: Goal set today Pt will Ambulate: >150 feet;with least restrictive assistive device;with modified independence PT Goal: Ambulate - Progress: Goal set today  Visit Information  Last PT Received On: 05/06/12 Assistance Needed: +1 Reason Eval/Treat Not Completed: Patient at procedure or test/unavailable (pt OOR and not avaiable for eval)    Subjective Data  Subjective: I was here  in May Patient  Stated Goal: be able to return home to dogs   Prior [place] Lives With: Alone Type of Home: Apartment Home Access: Level entry Home Layout: One level Bathroom Shower/Tub: Chiropodist: Standard Home Adaptive Equipment: Tub transfer bench;Walker - rolling;Straight cane;Wheelchair - manual Prior Function Level of Independence: Independent with assistive device(s) Able to Take Stairs?: No Driving: Yes Vocation: On disability Communication Communication: No difficulties    Cognition  Overall Cognitive Status: Appears within functional limits for tasks assessed/performed Arousal/Alertness: Awake/alert Orientation Level: Appears intact for tasks assessed Behavior During Session: [facility] for tasks performed Cognition - Other Comments: Pt does report having some difficulty with STM and processing but able to answer safety questions and orientation correctly    Extremity/Trunk Assessment Right Upper Extremity Assessment RUE ROM/Strength/Tone: Deficits RUE ROM/Strength/Tone Deficits: 3/5 shoulder flexion, elbow flexion, shoulder Abduction, grip strength RUE Coordination: Deficits RUE Coordination Deficits: missing target with finger to nose, able to perform finger to thumb very slowly and deliberately with difficulty Left Upper Extremity Assessment LUE ROM/Strength/Tone: Deficits (3/5 shoulder flexion, elbow flexion, shoulder Abduction, gri) LUE Coordination: Deficits (missing target with finger to nose, able to perform finger t) Right Lower Extremity Assessment RLE ROM/Strength/Tone: Deficits RLE ROM/Strength/Tone Deficits: 3/5 hip flexion, knee extension, knee flexion Left Lower Extremity Assessment LLE ROM/Strength/Tone: Deficits LLE ROM/Strength/Tone Deficits: 3/5 hip flexion, knee extension, knee flexion Trunk Assessment Trunk Assessment: Normal   Balance Static Sitting Balance Static Sitting - Balance Support: Feet supported Static Sitting -  Level of Assistance: 5: Stand by assistance Static Sitting - Comment/# of Minutes: 3 min   End of Session PT - End of Session Equipment Utilized During Treatment: Gait belt Activity Tolerance: Patient tolerated treatment well Patient left: in chair;with call bell/phone within reach Nurse Communication: Mobility status  GP     Melford Aase 05/06/2012, 12:25 PM  [name], [place]

## 2012-05-06 NOTE — Progress Notes (Signed)
Rehab Admissions Coordinator Note:  Patient was screened by Retta Diones for appropriateness for an Inpatient Acute Rehab Consult.  At this time, we are recommending HH or SNF if needed.  Patient likely already doing too well to meet criteria for acute inpatient rehab admission.  Retta Diones 05/06/2012, 1:14 PM  I can be reached at [phone].

## 2012-05-06 NOTE — Progress Notes (Signed)
Occupational Therapy Evaluation Patient Details Name: [name] MRN: [id number] DOB: 04/23/1955 Today's Date: 05/06/2012 Time: [id number] OT Time Calculation (min): 24 min  OT Assessment / Plan / Recommendation Clinical Impression  57 yo with c/o CP. (-) cardiac issues. Pt with onset on BUE jerky movement and apparent ptosis L eye. Pt states this started last night. At this time, pt would not be able to D/C home alone based on assessment this pm.  Pt would need 24/7 S. Will continue to assess D/C plan as blood sugars normalize. Performance appeared exaggerated and inconsistent at times.     OT Assessment  Patient needs continued OT Services    Follow Up Recommendations  Home health OT;SNF (HH vs SNF pending progress and further assessment)    Barriers to Discharge Decreased caregiver support    Equipment Recommendations  Other (comment) (TBA)    Recommendations for Other Services    Frequency  Min 3X/week    Precautions / Restrictions Precautions Precautions: Fall Precaution Comments: Pt with low blood sugar   Pertinent Vitals/Pain No c/o pain. Blood sugar 56    ADL  Eating/Feeding: Supervision/safety;Set up;Other (comment) (repetitive dropping of food and tensil) Grooming: Minimal assistance Where Assessed - Grooming: Supported sitting Upper Body Bathing: Moderate assistance Where Assessed - Upper Body Bathing: Supported sitting Lower Body Bathing: Moderate assistance Where Assessed - Lower Body Bathing: Supported sit to stand Upper Body Dressing: Moderate assistance Where Assessed - Upper Body Dressing: Unsupported sitting Lower Body Dressing: Moderate assistance Where Assessed - Lower Body Dressing: Supported sit to Lobbyist:  (Did not assess due to low blood sugar) Transfers/Ambulation Related to ADLs: Not assessed due to low Blood sugar ADL Comments: limited by uncoordinated movements. ataxic trunk movements    OT Diagnosis: Generalized  weakness;Ataxia;Disturbance of vision  OT Problem List: Decreased strength;Decreased activity tolerance;Impaired balance (sitting and/or standing);Impaired vision/perception;Decreased coordination;Decreased safety awareness;Decreased knowledge of use of DME or AE;Decreased knowledge of precautions;Cardiopulmonary status limiting activity;Impaired sensation;Obesity;Impaired UE functional use;Increased edema;Other (comment) (ataxic type movements) OT Treatment Interventions: Self-care/ADL training;Therapeutic exercise;Neuromuscular education;Energy conservation;DME and/or AE instruction;Therapeutic activities;Cognitive remediation/compensation;Visual/perceptual remediation/compensation;Patient/family education;Balance training   OT Goals Acute Rehab OT Goals OT Goal Formulation: With patient Time For Goal Achievement: 05/20/12 Potential to Achieve Goals: Good ADL Goals Pt Will Perform Grooming: with modified independence;Unsupported;Standing at sink ADL Goal: Grooming - Progress: Goal set today Pt Will Perform Lower Body Bathing: with modified independence;Sit to stand from chair;Unsupported;with adaptive equipment ADL Goal: Lower Body Bathing - Progress: Goal set today Pt Will Perform Lower Body Dressing: Sit to stand from chair;Unsupported;with adaptive equipment;with set-up ADL Goal: Lower Body Dressing - Progress: Goal set today Pt Will Transfer to Toilet: with supervision;Ambulation;with DME;3-in-1 ADL Goal: Toilet Transfer - Progress: Goal set today Pt Will Perform Toileting - Clothing Manipulation: with modified independence;Sitting on 3-in-1 or toilet;Standing;with adaptive equipment ADL Goal: Toileting - Clothing Manipulation - Progress: Goal set today Pt Will Perform Toileting - Hygiene: with modified independence;Sit to stand from 3-in-1/toilet ADL Goal: Toileting - Hygiene - Progress: Goal set today Arm Goals Additional Arm Goal #1: Pt will demonstrate functional coordination for  ADL Arm Goal: Additional Goal #1 - Progress: Goal set today  Visit Information  Last OT Received On: 05/06/12    Subjective Data      Prior Functioning     Home Living Lives With: Alone Available Help at Discharge:  (?. 2 dogs Cane and Cody) Type of Home: Apartment Home Access: Level entry Home Layout: One level Bathroom Shower/Tub:  Tub/shower unit Bathroom Toilet: Standard Bathroom Accessibility: Yes How Accessible: Accessible via walker Home Adaptive Equipment: Tub transfer bench;Walker - rolling;Straight cane;Wheelchair - manual Prior Function Level of Independence: Independent with assistive device(s) Able to Take Stairs?: No Driving: Yes Vocation: On disability Communication Communication: No difficulties Dominant Hand: Right         Vision/Perception Vision - Assessment Eye Alignment:  (will further assess. ?ptopia) Vision Assessment:  (will further assess)   Cognition  Overall Cognitive Status: No family/caregiver present to determine baseline cognitive functioning Arousal/Alertness: Awake/alert Orientation Level: Appears intact for tasks assessed Behavior During Session: [facility] for tasks performed    Extremity/Trunk Assessment Right Upper Extremity Assessment RUE ROM/Strength/Tone: Deficits RUE ROM/Strength/Tone Deficits: 3/5 shoulder flexion, elbow flexion, shoulder Abduction, grip strength RUE Sensation: Deficits (pt reports changes in sensation) RUE Coordination: Deficits RUE Coordination Deficits: decrased fine motor and gross motor. dropping items repeatedly Left Upper Extremity Assessment LUE ROM/Strength/Tone: Deficits LUE ROM/Strength/Tone Deficits: same as RUE Trunk Assessment Trunk Assessment: Other exceptions (ataxic trunk movements)     Mobility Bed Mobility Bed Mobility: Rolling Right;Right Sidelying to Sit;Sitting - Scoot to Edge of Bed Rolling Right: 6: Modified independent (Device/Increase time);With rail Right Sidelying to Sit: 6:  Modified independent (Device/Increase time);With rails;HOB elevated Sitting - Scoot to Edge of Bed: 6: Modified independent (Device/Increase time);With rail Details for Bed Mobility Assistance: increased time with need for use of rail to achieve sitting Transfers Transfers: Not assessed Details for Transfer Assistance: due to low sugar - per nsg request for pt to return to bed     Shoulder Instructions     Exercise     Balance Static Sitting Balance Static Sitting - Balance Support: Feet supported;Bilateral upper extremity supported Static Sitting - Level of Assistance: 4: Min assist;Other (comment) (pt listing posteriorly) Static Sitting - Comment/# of Minutes: 10 (pt with jerky trunk movement)   End of Session OT - End of Session Activity Tolerance: Treatment limited secondary to medical complications (Comment) (low blood sugar) Patient left: in bed;with call bell/phone within reach Nurse Communication: Mobility status  GO     Sirr Kabel,[name] 05/06/2012, 5:26 PM [facility], OTR/L  [phone] 05/06/2012

## 2012-05-06 NOTE — Progress Notes (Signed)
CRITICAL VALUE ALERT  Critical value received: CBG 69  Date of notification: 05/06/12  Time of notification: 1142  Critical value read back:yes  Nurse who received alert:  Lonzo Cloud RN  MD notified (1st page):  Rai  Time of first page: 1245  MD notified (2nd page):  Time of second page:  Responding MD: Rai  Time MD responded:  1250    CBG is now 103. MD notified and new orders given. Will continue to monitor paitent for further changes in condition.

## 2012-05-06 NOTE — Progress Notes (Signed)
Thank you for consult on [name]. Chart reviewed and note that PT evaluation was done today. She is at min assist for mobility. Anticipate that once her anxiety issues are controlled, she should get to modified independent level. Will defer formal consult for now.

## 2012-05-06 NOTE — Progress Notes (Signed)
Patient ID: [name]  female  [id number]    DOB: [birth year]    DOA: 05/03/2012  PCP: [name], MD  Assessment/Plan:  Tremors with shakiness - states symptoms started yesterday after lunchtime however she did not report to her nurse or physician. Complete neuro examination done, she is also very anxious, strength 5/5BL upper and lower Ext, however finger-to-nose coordination and heel-to-shin was positive. After I stated that I need to get MRI to r/o any cerebellar CVA, she started crying. States does not drink alcohol, but takes valium at home.  - Will obtain MRI of the brain, EEG, she is on aspirin will continue - place on xanax for anxiety. If all above w/u is negative, I will consult with psychiatry for her somatic issues and anxiety.   Chest pain, rule out cardiac, negative myoview in the past, LBBB, PHTN  W/u completed per Cardiology   Epigastric/abdom pain  - Today she states that abd pain is better but is distraught over her shakiness. Etiology unclear - pt has numerous complaints that are hard to reconcile with exam, she has had a cholecystectomy. Lipase is normal.     E coli UTI - pansensitive  Perhaps this is contributing to her abdom discomfort - cont Rocephin   Abnormal CXR  suspect atelectasis - possibly has been splinting due to abdominal/flank pain - no clinical sx to suggest PNA   Insulin dependent diabetes mellitus - with Proteinuria with BS running low today - DC meal coverage  Morbid obesity + sleep apnea  HTN - controlled   Chronic normocytic anemia: monitor counts  - will obtain anemia panel, Hb slightly down, likely dilutional  History of pulmonary embolism, recurrent, 3 seperate episodes/ DVT, bil in 08/2011  Presence of IVC filter, placed June 2011 after 3d PE  on Coumadin and therapeutic - VQ performed was normal   Chronic renal insufficiency, stage IV (severe) - due to DM & HTN  IVF dc'ed, resumed lasix  DVT Prophylaxis:  Code  Status:  Disposition: CIR consult placed    Subjective: Anxious over tremors and shaking, unable to pick up the cup today. However patient states that her symptoms had actually started after lunchtime yesterday but she did not report it to anyone.  Objective: Weight change: 0.3 kg (10.6 oz)  Intake/Output Summary (Last 24 hours) at 05/06/12 1258 Last data filed at 05/06/12 1200  Gross per 24 hour  Intake    780 ml  Output   1000 ml  Net   -220 ml   Blood pressure 124/72, pulse 65, temperature 99.5 F (37.5 C), temperature source Oral, resp. rate 20, height 5\' 3"  (1.6 m), weight 118.298 kg (260 lb 12.8 oz), SpO2 96.00%.  Physical Exam: General: Alert and awake, oriented x3, not in any acute distress. HEENT: anicteric sclera, pupils reactive to light and accommodation, EOMI CVS: S1-S2 clear, no murmur rubs or gallops Chest: clear to auscultation bilaterally, no wheezing, rales or rhonchi Abdomen: soft nontender, nondistended, normal bowel sounds, no organomegaly Extremities: no cyanosis, clubbing. 1+ edema noted bilaterally Neuro: Cranial nerves II-XII intact, strength 5 x 5 upper and lower activities bilaterally, tremors, finger to nose positive  Lab Results: Basic Metabolic Panel:  Lab [id number] 05/05/12 0630 05/03/12 1432  NA 136 138 --  K 4.0 3.7 --  CL 95* 94* --  CO2 30 33* --  GLUCOSE 154* 164* --  BUN 70* 73* --  CREATININE 1.68* 1.60* --  CALCIUM 9.3 9.6 --  MG -- --  2.6*  PHOS -- -- --   Liver Function Tests:  Lab 05/03/12 0525  AST 17  ALT 12  ALKPHOS 74  BILITOT 0.6  PROT 7.6  ALBUMIN 3.4*    Lab 05/06/12 0610  LIPASE 15  AMYLASE --   No results found for this basename: AMMONIA:2 in the last 168 hours CBC:  Lab 05/06/12 0610 05/05/12 0630 05/03/12 0525  WBC 11.2* 11.3* --  NEUTROABS -- -- 11.3*  HGB 8.7* 9.9* --  HCT 27.3* 32.0* --  MCV 87.8 88.2 --  PLT 160 163 --   Cardiac Enzymes:  Lab 05/04/12 0200 05/03/12 1957 05/03/12 1435   CKTOTAL -- -- --  CKMB -- -- --  CKMBINDEX -- -- --  TROPONINI <0.30 <0.30 <0.30   BNP: No components found with this basename: POCBNP:2 CBG:  Lab 05/06/12 1236 05/06/12 1142 05/06/12 0618 05/05/12 2137 05/05/12 1637  GLUCAP 87 69* 72 170* 164*     Micro Results: Recent Results (from the past 240 hour(s))  URINE CULTURE     Status: Normal   Collection Time   05/03/12 12:45 PM      Component Value Range Status Comment   Specimen Description URINE, CLEAN CATCH   Final    Special Requests NONE   Final    Culture  Setup Time 05/03/2012 13:33   Final    Colony Count >=100,000 COLONIES/ML   Final    Culture ESCHERICHIA COLI   Final    Report Status 05/05/2012 FINAL   Final    Organism ID, Bacteria ESCHERICHIA COLI   Final   MRSA PCR SCREENING     Status: Normal   Collection Time   05/03/12  1:35 PM      Component Value Range Status Comment   MRSA by PCR NEGATIVE  NEGATIVE Final     Studies/Results: Dg Chest 2 View  05/03/2012  *RADIOLOGY REPORT*  Clinical Data: Chest pain, shortness of breath.  CHEST - 2 VIEW  Comparison: 03/26/2012 CT  Findings: Heart size upper normal.  Pulmonary arterial prominence. Mild bibasilar opacities.  No definite pleural effusion or pneumothorax.  Limited osseous bowel evaluation without definite acute process  IMPRESSION: Mild lung base opacities; atelectasis (favored) versus infiltrate.  Pulmonary arterial hypertension.   Original Report Authenticated By: Carlos Levering, M.D.    Nm Pulmonary Perf And Vent  05/03/2012  *RADIOLOGY REPORT*  Clinical Data:  Shortness of breath  NM PULMONARY VENTILATION AND PERFUSION SCANS; ventilation study with re- breathing  Views:  Anterior and posterior:  Ventilation; anterior, posterior, right lateral, left lateral, RPO, LAO, LPO, RAO:  Perfusion  Radiopharmaceutical: Xenon 133 gas:  Ventilation; technetium 81m macroaggregated albumin:  Perfusion  Dose:  10.0 mCi:  Ventilation; 6.0 mCi:  Perfusion  Route of  administration:  Inhalation:  Ventilation; intravenous: Perfusion  Comparison:  Chest radiograph May 03, 2012  Findings: The ventilation study shows homogeneous and symmetric uptake of radiotracer with prompt washout bilaterally.  The perfusion study shows homogeneous and  symmetric uptake without appreciable perfusion defect.  There is no appreciable ventilation / perfusion mismatch.  IMPRESSION: Essentially normal ventilation and perfusion lung scans.  Very low probability of pulmonary embolus.   Original Report Authenticated By: Lowella Grip, M.D.     Medications: Scheduled Meds:   . aspirin EC  81 mg Oral Daily  . atorvastatin  10 mg Oral q1800  . cefTRIAXone (ROCEPHIN)  IV  1 g Intravenous Q24H  . citalopram  20 mg Oral QHS  .  gabapentin  300 mg Oral BID  . glimepiride  2 mg Oral QAC breakfast  . insulin aspart  0-20 Units Subcutaneous TID WC  . insulin aspart  0-5 Units Subcutaneous QHS  . insulin aspart  7 Units Subcutaneous TID WC  . insulin glargine  25 Units Subcutaneous QHS  . isosorbide mononitrate  30 mg Oral Daily  . levothyroxine  25 mcg Oral Daily  . metoprolol  50 mg Oral BID  . multivitamin with minerals  1 tablet Oral Daily  . Muscle Rub   Topical TID  . omega-3 acid ethyl esters  1 g Oral Daily  . pantoprazole  40 mg Oral Q1200  . warfarin  12.5 mg Oral ONCE-1800  . Warfarin - Pharmacist Dosing Inpatient   Does not apply q1800      LOS: 3 days   Rikki Trosper M.D. [facility] 05/06/2012, 12:58 PM Pager: [phone]  If 7PM-7AM, please contact night-coverage [email or web address] Password [email or web address]

## 2012-05-06 NOTE — Progress Notes (Signed)
OT Cancellation Note  Patient Details Name: [name] MRN: [id number] DOB: 1955-11-14   Cancelled Treatment:    Reason Eval/Treat Not Completed: Patient at procedure or test/ unavailable (pt in xray. will attempt this pm )  [facility], OTR/L  [phone] 1/30/20141/30/2014, 1:32 PM

## 2012-05-06 NOTE — Progress Notes (Signed)
Utilization Review Completed.   Adonis Ryther, RN, BSN Nurse Case Manager  [phone]  

## 2012-05-06 NOTE — Progress Notes (Signed)
PT Cancellation Note  Patient Details Name: [name] MRN: [id number] DOB: Jul 13, 1955   Cancelled Treatment:    Reason Eval/Treat Not Completed: Patient at procedure or test/unavailable (pt OOR and not avaiable for eval) Will attempt later today as time allows.  [name], [place]

## 2012-05-06 NOTE — Progress Notes (Signed)
CRITICAL VALUE ALERT  Critical value received: CBG 55  Date of notification: 05/06/12  Time of notification: 1640  Critical value read back:yes  Nurse who received alert:  Lonzo Cloud RN  MD notified (1st page):  Rai  Time of first page: 58  MD notified (2nd page): Rai  Time of second page:1800  Responding MD:   Time MD responded:    CBG increased to 56, then to 84. MD paged twice. No response. Made receiving RN aware. Will continue to monitor patient for further changes in condition.

## 2012-05-06 NOTE — Progress Notes (Signed)
EEG completed.

## 2012-05-06 NOTE — Progress Notes (Signed)
ANTICOAGULATION CONSULT NOTE - Follow Up Consult  Pharmacy Consult for Coumadin Indication: chest pain/ACS, hx of multiple PE's with IVC in place   Allergies  Allergen Reactions  . Morphine And Related Rash    Patient Measurements: Height: 5\' 3"  (160 cm) Weight: 260 lb 12.8 oz (118.298 kg) (pt very unsteady on feet/involuntary muscle movement) IBW/kg (Calculated) : 52.4  Heparin Dosing Weight:   Vital Signs: Temp: 99.5 F (37.5 C) (01/30 0437) Temp src: Oral (01/30 0437) BP: 124/72 mmHg (01/30 0929) Pulse Rate: 65  (01/30 0929)  Labs:  Basename 05/06/12 0610 05/05/12 1637 05/05/12 0630 05/04/12 0200 05/03/12 1957 05/03/12 1435  HGB 8.7* -- 9.9* -- -- --  HCT 27.3* -- 32.0* 32.7* -- --  PLT 160 -- 163 161 -- --  APTT -- -- -- -- -- --  LABPROT 23.7* -- 30.8* 35.1* -- --  INR 2.23* -- 3.17* 3.78* -- --  HEPARINUNFRC -- -- -- -- -- --  CREATININE -- 1.68* 1.60* 1.88* -- --  CKTOTAL -- -- -- -- -- --  CKMB -- -- -- -- -- --  TROPONINI -- -- -- <0.30 <0.30 <0.30    Estimated Creatinine Clearance: 46.5 ml/min (by C-G formula based on Cr of 1.68).   Assessment: CP & SOB PMH: CHF, DM, OA, HTN, depression, DVT, PE, CKD, afib, OSA  AC: Coumadin PTA for hx PE/DVT, Afib. INR 2.23. Hgb down to 8.7. Coumadin resumed as no longer planning cath. Last dose of Coumadin 1/26 and only 2.5mg  ordered 1/29. INR continues to plummet and may need Lovenox tomorrow if INR<2.  ID: Ecoli UTI - Afebrile. WBC 8.7. 1/27 ceftriaxone>> 1/27 urine - e coli  CV:acs ruled out, htn, still with vague chest pain now mainly in abdomen - ?neuropathic. No further cardiac issues or workup planned.  VSS. Meds: ASA 81mg , Lipitor, Imdur, metoprolol, Lovaza  GI: c/o severe abdom pain which extends across her abdom. Lipase normal. +PPI.  Endo:DM - CBG 72-164, synthroid resumed Meds: SSI, Amaryl, Lantus  Neuro-? Neuropathic pain - added gabapentin - patient educated on new med d/t multiple questions on  "what is wrong with her" very emotional about being unable to find the source of her pain. Also on Celexa.  Renal:Stage IV CKD, SCr 1.68  Pulm:OSA, VQ scan was normal, IVC filter placed in past after 3rd PE  Heme/Onc:Hgb continues to drop.  Other: home meds addressed, lasix/zaroxyln on hold  Goal of Therapy:  INR 2-3 Monitor platelets by anticoagulation protocol: Yes   Plan:  Coumadin 12.5mg  po x 1 tonight. Can we get an order for Lovenox per pharmacy to cover if INR drops <2?  Alford Highland, [employer] 05/06/2012,9:53 AM

## 2012-05-07 DIAGNOSIS — F459 Somatoform disorder, unspecified: Secondary | ICD-10-CM

## 2012-05-07 DIAGNOSIS — R251 Tremor, unspecified: Secondary | ICD-10-CM | POA: Diagnosis present

## 2012-05-07 DIAGNOSIS — F449 Dissociative and conversion disorder, unspecified: Secondary | ICD-10-CM

## 2012-05-07 DIAGNOSIS — E162 Hypoglycemia, unspecified: Secondary | ICD-10-CM | POA: Diagnosis present

## 2012-05-07 DIAGNOSIS — R259 Unspecified abnormal involuntary movements: Secondary | ICD-10-CM

## 2012-05-07 LAB — BASIC METABOLIC PANEL
BUN: 80 mg/dL — ABNORMAL HIGH (ref 6–23)
CO2: 30 mEq/L (ref 19–32)
Calcium: 8.7 mg/dL (ref 8.4–10.5)
Chloride: 91 mEq/L — ABNORMAL LOW (ref 96–112)
Creatinine, Ser: 2.42 mg/dL — ABNORMAL HIGH (ref 0.50–1.10)
GFR calc Af Amer: 25 mL/min — ABNORMAL LOW (ref >90)
GFR calc non Af Amer: 21 mL/min — ABNORMAL LOW (ref >90)
Glucose, Bld: 38 mg/dL — CL (ref 70–99)
Potassium: 3.3 mEq/L — ABNORMAL LOW (ref 3.5–5.1)
Sodium: 131 mEq/L — ABNORMAL LOW (ref 135–145)

## 2012-05-07 LAB — GLUCOSE, CAPILLARY
Glucose-Capillary: 54 mg/dL — ABNORMAL LOW (ref 70–99)
Glucose-Capillary: 55 mg/dL — ABNORMAL LOW (ref 70–99)
Glucose-Capillary: 34 mg/dL — CL (ref 70–99)
Glucose-Capillary: 53 mg/dL — ABNORMAL LOW (ref 70–99)
Glucose-Capillary: 70 mg/dL (ref 70–99)

## 2012-05-07 LAB — PROTIME-INR
INR: 1.88 — ABNORMAL HIGH (ref 0.00–1.49)
Prothrombin Time: 20.9 seconds — ABNORMAL HIGH (ref 11.6–15.2)

## 2012-05-07 LAB — VITAMIN B12: Vitamin B-12: 650 pg/mL (ref 211–911)

## 2012-05-07 LAB — FOLATE: Folate: >20 ng/mL

## 2012-05-07 MED ORDER — ENOXAPARIN SODIUM 120 MG/0.8ML ~~LOC~~ SOLN
120.0000 mg | Freq: Two times a day (BID) | SUBCUTANEOUS | Status: DC
  Administered 2012-05-07 – 2012-05-08 (×4): 120 mg via SUBCUTANEOUS
  Filled 2012-05-07 (×7): qty 0.8

## 2012-05-07 MED ORDER — DEXTROSE 50 % IV SOLN
INTRAVENOUS | Status: AC
  Administered 2012-05-07: 50 mL via INTRAVENOUS
  Filled 2012-05-07: qty 50

## 2012-05-07 MED ORDER — DEXTROSE 50 % IV SOLN
50.0000 mL | Freq: Once | INTRAVENOUS | Status: AC | PRN
  Administered 2012-05-07 – 2012-05-08 (×2): 50 mL via INTRAVENOUS
  Filled 2012-05-07: qty 50

## 2012-05-07 MED ORDER — POTASSIUM CHLORIDE CRYS ER 20 MEQ PO TBCR
40.0000 meq | EXTENDED_RELEASE_TABLET | Freq: Once | ORAL | Status: AC
  Administered 2012-05-07: 40 meq via ORAL
  Filled 2012-05-07: qty 2

## 2012-05-07 MED ORDER — DEXTROSE-NACL 5-0.45 % IV SOLN
INTRAVENOUS | Status: DC
  Administered 2012-05-07: 75 mL/h via INTRAVENOUS
  Administered 2012-05-08: 13:00:00 via INTRAVENOUS

## 2012-05-07 MED ORDER — FUROSEMIDE 40 MG PO TABS: 40.0000 mg | ORAL_TABLET | Freq: Every day | ORAL | Status: DC

## 2012-05-07 MED ORDER — INSULIN ASPART 100 UNIT/ML ~~LOC~~ SOLN
0.0000 [IU] | Freq: Three times a day (TID) | SUBCUTANEOUS | Status: DC
  Administered 2012-05-08: 5 [IU] via SUBCUTANEOUS
  Administered 2012-05-09: 7 [IU] via SUBCUTANEOUS

## 2012-05-07 MED ORDER — GLUCOSE 40 % PO GEL
ORAL | Status: AC
  Administered 2012-05-07: 37.5 g
  Filled 2012-05-07: qty 1

## 2012-05-07 MED ORDER — WARFARIN SODIUM 10 MG PO TABS
10.0000 mg | ORAL_TABLET | Freq: Once | ORAL | Status: AC
  Administered 2012-05-07: 10 mg via ORAL
  Filled 2012-05-07: qty 1

## 2012-05-07 NOTE — Consult Note (Signed)
NEURO HOSPITALIST CONSULT NOTE    Reason for Consult:new onset tremors and shakiness.  HPI:                                                                                                                                          [name] is an 57 y.o. female with a past medical history significant for hypertension, diabetes, congestive heart failure, atrial fibrillation, PE, sleep apnea, admitted to the hospital with chest pain. While in the hospital, she developed new onset tremors/shakiness involving upper and lower extremities. She stated that she first noticed the abnormal movements in her hands and legs 3 days ago. No prior history of such movements, and there is no family history of movement disorders. No new medications but takes celexa and valium sporadically for back spasms. No recent head trauma. She tells me that the tremors/shakiness occurs sometimes simultaneously in the hands and legs, for the most part randomly, non repetitive, intermittently, painless movements that are not provoked by anything and are not under volitional control. The shakiness doesn't occur when she is sleep. No associated impairment of consciousness. Furthermore, she expressed that the tremors in her hands happen mainly when she tries to hold things and thus she needs to use both hands. Interestingly, she said that the abnormal movements are impacting her short term memory. No vertigo, double vision, difficulty swallowing, focal weakness or numbness, difficulty speaking, falls, or gait impairment. Her symptoms prompted MRI brain and EEG and both tests are unremarkable.  Past Medical History  Diagnosis Date  . CHF (congestive heart failure)   . Diabetes mellitus   . Arthritis   . Hypertension   . Depression   . DVT (deep venous thrombosis) 10 years ago  . PE (pulmonary thromboembolism) 3 years ag0  . Renal disorder     kindey function low  . A-fib   . Sleep apnea, has not  started titration  05/03/2012    Past Surgical History  Procedure Date  . Eye surgery   . Cholecystectomy   . Cesarean section   . Ivc     ivc filter    Family History  Problem Relation Age of Onset  . Cerebral aneurysm Mother   . Hypertension Father     Family History: no family history of movement disorders.   Social History:  reports that she has been passively smoking.  She does not have any smokeless tobacco history on file. She reports that she does not drink alcohol or use illicit drugs.  Allergies  Allergen Reactions  . Morphine And Related Rash    MEDICATIONS:  I have reviewed the patient's current medications.   ROS:                                                                                                                                       History obtained from the patient.  General ROS: negative for - chills, fatigue, fever, night sweats, weight gain or weight loss Psychological ROS: negative for - behavioral disorder, hallucinations, memory difficulties, mood swings or suicidal ideation Ophthalmic ROS: significant for blurred vision and " changing colors". ENT ROS: negative for - epistaxis, nasal discharge, oral lesions, sore throat, tinnitus or vertigo Allergy and Immunology ROS: negative for - hives or itchy/watery eyes Hematological and Lymphatic ROS: negative for - bleeding problems, bruising or swollen lymph nodes Endocrine ROS: negative for - galactorrhea, hair pattern changes, polydipsia/polyuria or temperature intolerance Respiratory ROS: negative for - cough, hemoptysis, shortness of breath or wheezing Cardiovascular ROS: significant  for - chest pain. Gastrointestinal ROS: negative for - abdominal pain, diarrhea, hematemesis, nausea/vomiting or stool incontinence Genito-Urinary ROS: negative for - dysuria, hematuria, incontinence  or urinary frequency/urgency Musculoskeletal ROS: significant for joint swelling. Neurological ROS: as noted in HPI Dermatological ROS: significant for rash and skin lesion changes lower extremities.  Physical exam: Blood pressure 120/50, pulse 66, temperature 97.9 F (36.6 C), temperature source Oral, resp. rate 19, height 5\' 3"  (1.6 m), weight 120.838 kg (266 lb 6.4 oz), SpO2 97.00%. Head: normocephalic. Neck: supple. Cardiac: no murmurs. Lungs: clear. Abdomen: soft. Extremities: bilateral pitting edema.  Neurologic Examination:                                                                                                      Mental Status: Alert, oriented, thought content appropriate.  Speech fluent without evidence of aphasia.  Able to follow 3 step commands without difficulty. Cranial Nerves: II: Discs flat bilaterally; Visual fields grossly normal, pupils equal, round, reactive to light and accommodation III,IV, VI: ptosis not present, extra-ocular motions intact bilaterally V,VII: smile symmetric, facial light touch sensation normal bilaterally VIII: hearing normal bilaterally IX,X: gag reflex present XI: bilateral shoulder shrug XII: midline tongue extension Motor: Right : Upper extremity   5/5    Left:     Upper extremity   5/5  Lower extremity   5/5     Lower extremity   5/5 Tone and bulk:normal tone throughout; no atrophy noted Sensory: Pinprick and light touch intact throughout, bilaterally Deep Tendon Reflexes: 2+ and symmetric throughout Plantars: Right: downgoing  Left: downgoing Cerebellar: She exhibits significant inconsistencies on finger to nose exam as well as when she outstretches her arms. Gait: no frank ataxia or bradykinesia. CV: pulses palpable throughout     Lab Results  Component Value Date/Time   CHOL 120 05/04/2012  2:00 AM    Results for orders placed during the hospital encounter of 05/03/12 (from the past 48 hour(s))  GLUCOSE, CAPILLARY      Status: Abnormal   Collection Time   05/05/12 11:58 AM      Component Value Range Comment   Glucose-Capillary 126 (*) 70 - 99 mg/dL   BASIC METABOLIC PANEL     Status: Abnormal   Collection Time   05/05/12  4:37 PM      Component Value Range Comment   Sodium 136  135 - 145 mEq/L    Potassium 4.0  3.5 - 5.1 mEq/L    Chloride 95 (*) 96 - 112 mEq/L    CO2 30  19 - 32 mEq/L    Glucose, Bld 154 (*) 70 - 99 mg/dL    BUN 70 (*) 6 - 23 mg/dL    Creatinine, Ser 1.68 (*) 0.50 - 1.10 mg/dL    Calcium 9.3  8.4 - 10.5 mg/dL    GFR calc non Af Amer 33 (*) >90 mL/min    GFR calc Af Amer 38 (*) >90 mL/min   GLUCOSE, CAPILLARY     Status: Abnormal   Collection Time   05/05/12  4:37 PM      Component Value Range Comment   Glucose-Capillary 164 (*) 70 - 99 mg/dL    Comment 1 Documented in Chart      Comment 2 Notify RN     GLUCOSE, CAPILLARY     Status: Abnormal   Collection Time   05/05/12  9:37 PM      Component Value Range Comment   Glucose-Capillary 170 (*) 70 - 99 mg/dL   PROTIME-INR     Status: Abnormal   Collection Time   05/06/12  6:10 AM      Component Value Range Comment   Prothrombin Time 23.7 (*) 11.6 - 15.2 seconds    INR 2.23 (*) 0.00 - 1.49   CBC     Status: Abnormal   Collection Time   05/06/12  6:10 AM      Component Value Range Comment   WBC 11.2 (*) 4.0 - 10.5 K/uL    RBC 3.11 (*) 3.87 - 5.11 MIL/uL    Hemoglobin 8.7 (*) 12.0 - 15.0 g/dL    HCT 27.3 (*) 36.0 - 46.0 %    MCV 87.8  78.0 - 100.0 fL    MCH 28.0  26.0 - 34.0 pg    MCHC 31.9  30.0 - 36.0 g/dL    RDW 16.5 (*) 11.5 - 15.5 %    Platelets 160  150 - 400 K/uL   LIPASE, BLOOD     Status: Normal   Collection Time   05/06/12  6:10 AM      Component Value Range Comment   Lipase 15  11 - 59 U/L   GLUCOSE, CAPILLARY     Status: Normal   Collection Time   05/06/12  6:18 AM      Component Value Range Comment   Glucose-Capillary 72  70 - 99 mg/dL    Comment 1 Documented in Chart      Comment 2 Notify RN      GLUCOSE, CAPILLARY  Status: Abnormal   Collection Time   05/06/12 11:42 AM      Component Value Range Comment   Glucose-Capillary 69 (*) 70 - 99 mg/dL    Comment 1 Notify RN      Comment 2 Documented in Chart     GLUCOSE, CAPILLARY     Status: Normal   Collection Time   05/06/12 12:36 PM      Component Value Range Comment   Glucose-Capillary 87  70 - 99 mg/dL   VITAMIN B12     Status: Normal   Collection Time   05/06/12  3:08 PM      Component Value Range Comment   Vitamin B-12 650  211 - 911 pg/mL   IRON AND TIBC     Status: Abnormal   Collection Time   05/06/12  3:08 PM      Component Value Range Comment   Iron 10 (*) 42 - 135 ug/dL    TIBC 198 (*) 250 - 470 ug/dL    Saturation Ratios 5 (*) 20 - 55 %    UIBC 188  125 - 400 ug/dL   FERRITIN     Status: Normal   Collection Time   05/06/12  3:08 PM      Component Value Range Comment   Ferritin 209  10 - 291 ng/mL   RETICULOCYTES     Status: Abnormal   Collection Time   05/06/12  3:08 PM      Component Value Range Comment   Retic Ct Pct 1.2  0.4 - 3.1 %    RBC. 3.16 (*) 3.87 - 5.11 MIL/uL    Retic Count, Manual 37.9  19.0 - 186.0 K/uL   GLUCOSE, CAPILLARY     Status: Abnormal   Collection Time   05/06/12  4:40 PM      Component Value Range Comment   Glucose-Capillary 55 (*) 70 - 99 mg/dL    Comment 1 Notify RN      Comment 2 Documented in Chart     GLUCOSE, CAPILLARY     Status: Abnormal   Collection Time   05/06/12  4:55 PM      Component Value Range Comment   Glucose-Capillary 56 (*) 70 - 99 mg/dL    Comment 1 Notify RN      Comment 2 Documented in Chart     GLUCOSE, CAPILLARY     Status: Normal   Collection Time   05/06/12  5:51 PM      Component Value Range Comment   Glucose-Capillary 84  70 - 99 mg/dL   GLUCOSE, CAPILLARY     Status: Abnormal   Collection Time   05/06/12  9:22 PM      Component Value Range Comment   Glucose-Capillary 56 (*) 70 - 99 mg/dL    Comment 1 Notify RN      Comment 2 Documented in  Chart     GLUCOSE, CAPILLARY     Status: Abnormal   Collection Time   05/07/12  2:58 AM      Component Value Range Comment   Glucose-Capillary 54 (*) 70 - 99 mg/dL    Comment 1 Documented in Chart      Comment 2 Notify RN     GLUCOSE, CAPILLARY     Status: Abnormal   Collection Time   05/07/12  4:28 AM      Component Value Range Comment   Glucose-Capillary 109 (*) 70 - 99 mg/dL  Comment 1 Documented in Chart      Comment 2 Notify RN     GLUCOSE, CAPILLARY     Status: Abnormal   Collection Time   05/07/12  5:01 AM      Component Value Range Comment   Glucose-Capillary 63 (*) 70 - 99 mg/dL   GLUCOSE, CAPILLARY     Status: Abnormal   Collection Time   05/07/12  5:28 AM      Component Value Range Comment   Glucose-Capillary 52 (*) 70 - 99 mg/dL   PROTIME-INR     Status: Abnormal   Collection Time   05/07/12  5:50 AM      Component Value Range Comment   Prothrombin Time 20.9 (*) 11.6 - 15.2 seconds    INR 1.88 (*) 0.00 - 99991111   BASIC METABOLIC PANEL     Status: Abnormal   Collection Time   05/07/12  5:50 AM      Component Value Range Comment   Sodium 131 (*) 135 - 145 mEq/L    Potassium 3.3 (*) 3.5 - 5.1 mEq/L DELTA CHECK NOTED   Chloride 91 (*) 96 - 112 mEq/L    CO2 30  19 - 32 mEq/L    Glucose, Bld 38 (*) 70 - 99 mg/dL    BUN 80 (*) 6 - 23 mg/dL    Creatinine, Ser 2.42 (*) 0.50 - 1.10 mg/dL    Calcium 8.7  8.4 - 10.5 mg/dL    GFR calc non Af Amer 21 (*) >90 mL/min    GFR calc Af Amer 25 (*) >90 mL/min   GLUCOSE, CAPILLARY     Status: Abnormal   Collection Time   05/07/12  5:54 AM      Component Value Range Comment   Glucose-Capillary 55 (*) 70 - 99 mg/dL    Comment 1 Notify RN     GLUCOSE, CAPILLARY     Status: Abnormal   Collection Time   05/07/12  6:57 AM      Component Value Range Comment   Glucose-Capillary 34 (*) 70 - 99 mg/dL    Comment 1 Documented in Chart      Comment 2 Notify RN     GLUCOSE, CAPILLARY     Status: Normal   Collection Time   05/07/12  8:04 AM       Component Value Range Comment   Glucose-Capillary 93  70 - 99 mg/dL     Mr Brain Wo Contrast  05/06/2012  *RADIOLOGY REPORT*  Clinical Data: 57 year old female with tremor, anxiety, new loss of coordination.  Renal failure.  MRI HEAD WITHOUT CONTRAST  Technique:  Multiplanar, multiecho pulse sequences of the brain and surrounding structures were obtained according to standard protocol without intravenous contrast.  Comparison: Head CT without contrast 11/15/2008.  Findings: No restricted diffusion to suggest acute infarction.  No midline shift, mass effect, evidence of mass lesion, ventriculomegaly, extra-axial collection or acute intracranial hemorrhage.  Cervicomedullary junction and pituitary are within normal limits.  Major intracranial vascular flow voids are preserved.  Scattered mostly small cerebral white matter foci of T2 and FLAIR hyperintensity, mild to moderate for age.  No cortical encephalomalacia.  Deep gray matter nuclei, brainstem and cerebellum are within normal limits.  Negative visualized cervical spine.  Postoperative changes to the globes.  Fluid or mucosal thickening completely opacifying the right maxillary sinus and small volume fluid in the right mastoids. Similar appearance noted in 2010. Negative visualized nasopharynx.  Other Visualized paranasal sinuses  and mastoids are clear.  Normal bone marrow signal. Negative scalp soft tissues.  IMPRESSION: 1. No acute intracranial abnormality. 2.  Mild to moderate for age nonspecific white matter signal changes. 3.  Chronic right maxillary sinus disease and small right mastoid effusion.   Original Report Authenticated By: Roselyn Reef, M.D.      Assessment/Plan: 57 years old female with new onset tremor/shakiness with a patten described above. Her neuro-exam is non focal and has many features to suggest functionality. Furthermore, brain MRI and EEG are unremarkable. I doubt an structural cause for patient's new onset  tremors/shakiness and explained this to her in details. She has no family history of movement disorders, and it will be unusual for medications to provoked her rather unusual movements which don't follow any pattern. I think no further neurological testing is needed at this time, but she will need follow up by neurology as outpatient to try to disentangle the cause of her movements.  Dorian Pod, MD [facility] [phone]  05/07/2012, 10:22 AM

## 2012-05-07 NOTE — Progress Notes (Signed)
Hypoglycemic Event  CBG:56  Treatment: 15 GM carbohydrate snack  Symptoms: Shaky  Follow-up CBG: Time: 0258 CBG Result:54  Possible Reasons for Event: Unknown  Comments/MD notified: On call Mid-level MD was paged and notified of CBG at 2122 of 56 and snack was given. CBG was taken again at 0258 and was 54. On call MD was paged again and notified.     Orest Dygert R  Remember to initiate Hypoglycemia Order Set & complete

## 2012-05-07 NOTE — Consult Note (Signed)
Patient Identification:  [name] Date of Evaluation:  05/07/2012 Reason for Consult:  Multiple Sx complaints, Somatization  Referring Provider: Dr. Tana Coast  History of Present Illness: Pt states that she began feeling pain in her chest, that spread to her upper back and down both arms. She feared a Heart Attack and called EMS. She says she cannot use her hands/cannot hold things.    Past Psychiatric History:  She says in May 2012, she had weakness and tremors in her legs and had to go to rehab.  She says her son will not speak with her because he married a 'spoiled' wife who took a disliking to her.  He has no contact and she cannot see her 15 yo granddaughter.   Past Medical History:     Past Medical History  Diagnosis Date  . CHF (congestive heart failure)   . Diabetes mellitus   . Arthritis   . Hypertension   . Depression   . DVT (deep venous thrombosis) 10 years ago  . PE (pulmonary thromboembolism) 3 years ag0  . Renal disorder     kindey function low  . A-fib   . Sleep apnea, has not started titration  05/03/2012       Past Surgical History  Procedure Date  . Eye surgery   . Cholecystectomy   . Cesarean section   . Ivc     ivc filter    Allergies:  Allergies  Allergen Reactions  . Morphine And Related Rash    Current Medications:  Prior to Admission medications   Medication Sig Start Date End Date Taking? Authorizing Provider  aspirin EC 81 MG tablet Take 81 mg by mouth daily.   Yes Historical Provider, MD  citalopram (CELEXA) 20 MG tablet Take 20 mg by mouth at bedtime.   Yes Historical Provider, MD  diazepam (VALIUM) 5 MG tablet Take 5 mg by mouth at bedtime as needed. For muscle spasms.   Yes Historical Provider, MD  furosemide (LASIX) 80 MG tablet Take 1 tablet (80 mg total) by mouth daily. 09/04/11 09/03/12 Yes Luke K Kilroy, PA  glimepiride (AMARYL) 2 MG tablet Take 2 mg by mouth daily before breakfast.   Yes Historical Provider, MD   HYDROcodone-acetaminophen (VICODIN) 5-500 MG per tablet Take 1 tablet by mouth every 8 (eight) hours as needed. For pain.   Yes Historical Provider, MD  insulin glargine (LANTUS) 100 UNIT/ML injection Inject 23 Units into the skin See admin instructions. She uses a sliding scale.   Yes Historical Provider, MD  insulin glulisine (APIDRA) 100 UNIT/ML injection Inject 10 Units into the skin 3 (three) times daily before meals.  09/08/11  Yes Historical Provider, MD  levothyroxine (SYNTHROID, LEVOTHROID) 25 MCG tablet Take 25 mcg by mouth daily.   Yes Historical Provider, MD  metolazone (ZAROXOLYN) 2.5 MG tablet Take one tablet every Monday/Wednesday?Friday. Take 30 minutes prior to taking Furosemide 09/04/11  Yes Erlene Quan, PA  metoprolol (LOPRESSOR) 50 MG tablet Take 50 mg by mouth 2 (two) times daily.   Yes Historical Provider, MD  Multiple Vitamin (MULITIVITAMIN WITH MINERALS) TABS Take 1 tablet by mouth daily.   Yes Historical Provider, MD  Omega-3 Fatty Acids (FISH OIL) 1200 MG CAPS Take 1 capsule by mouth 2 (two) times daily.   Yes Historical Provider, MD  pantoprazole (PROTONIX) 40 MG tablet Take 1 tablet (40 mg total) by mouth daily at 12 noon. 09/04/11 09/03/12 Yes Erlene Quan, PA  warfarin (COUMADIN) 2.5 MG  tablet Take 5-7.5 mg by mouth daily. 5 mg on Tue,Thu,Sat  and then 7.5 mg on Sun, Mon,Wed,Fri   Yes Historical Provider, MD  OVER THE COUNTER MEDICATION Take 2 tablets by mouth daily. Cherry 1000mg  tablets.    Historical Provider, MD    Social History:    reports that she has been passively smoking.  She does not have any smokeless tobacco history on file. She reports that she does not drink alcohol or use illicit drugs.   Family History:    Family History  Problem Relation Age of Onset  . Cerebral aneurysm Mother   . Hypertension Father     Mental Status Examination/Evaluation: Objective:  Appearance: Fairly Groomed and obese  Eye Contact::  Good  Speech:  Clear and Coherent   Volume:  Normal  Mood:  Confused about symptoms  Affect:  Blunt, Congruent and mystified  Thought Process:  Coherent, Goal Directed, Logical and poor insight  Orientation:  Full (Time, Place, and Person)  Thought Content:  unable to understand son's disdain  Suicidal Thoughts:  No  Homicidal Thoughts:  No  Judgement:  Fair  Insight:  Lacking   DIAGNOSIS:   AXIS I  r/o Conversion disorder vs somatization DO  AXIS II  Deferred  AXIS III See medical notes.  AXIS IV other psychosocial or environmental problems, problems related to social environment, problems with primary support group and Pt is realistic saying she needs help but wants home care insteat of SNF  AXIS V 41-50 serious symptoms   Assessment/Plan:  Called Dr. Tana Coast Pt recounts good relationship withy son until he married woman she believes is 'spoiled'.  There is no mother son/daughter in law relationship since.  She regrets not seeing her grand-daughter.  It is not determined to what degree this animosity has contributed to her symptoms "unable to hold anything"    She is fiercely attached to her two G.Shepherd dogs and refuses to leave them.  "These are my children"    She has had a lot of cardiac problems which made her anxious when current symptoms occurred.  She does acknowledge she needs help and is amenable to finding someone to help in her one bedroom apt.  She has planned how she will convert the living room with fold-out sofa.     While meeting with this patient, she had phone calls from her brother [held cell phone] and from her uncle [held room phone; turned it on and off]  She also held her cup in one hand and then [oops] almost tipped it over before she caught it with both hands.   She remained calm and coherent without appropriate affect with respect to the fact that her son has rejected their relationship.  It is possible she does not find an association between this disrupted relationship and her inability to hold  on. RECOMMENDATION:  1.  Pt has capacity to engage in her treatment.  2.  Pt is not suicidal and is unwilling to go to SNF.  3.  Pt requests home care to return to her two dogs. 4.  Suggest outpatient referral to psychiatrist and therapist.  5.  No further psychiatric needs identified.  MD Psychiatrist signs off.  Maryruth Eve MD 05/07/2012 8:32 PM

## 2012-05-07 NOTE — Progress Notes (Signed)
Physical Therapy Treatment Patient Details Name: [name] MRN: [id number] DOB: [birth year] Today's Date: 05/07/2012 Time: [id number] PT Time Calculation (min): 25 min  PT Assessment / Plan / Recommendation Comments on Treatment Session  Pt admitted with CP, UTI and atelectasis. Pt limited with mobility today due to pain meds and lethargy and required return to bed due to decreased cognition and awareness. Will continue to follow and encoruage mobility with nursing as pt able pending mental clairty. Pt with some UE jerking today does not seem as significant as yesterday but difficult to discern based on lethargy and whether truly jerking or just falling asleep and not attending to task.     Follow Up Recommendations  Supervision for mobility/OOB;SNF;Home health PT (pending progress)     Does the patient have the potential to tolerate intense rehabilitation     Barriers to Discharge        Equipment Recommendations       Recommendations for Other Services    Frequency     Plan Discharge plan needs to be updated;Frequency remains appropriate    Precautions / Restrictions Precautions Precautions: Fall Restrictions Weight Bearing Restrictions: No   Pertinent Vitals/Pain No pain, premedicated sats 93% on RA, HR 73    Mobility  Bed Mobility Bed Mobility: Supine to Sit;Sit to Supine Supine to Sit: 4: Min assist;HOB elevated (HOB 30degrees) Sitting - Scoot to Edge of Bed: 4: Min guard Sit to Supine: 4: Min assist;HOB flat Details for Bed Mobility Assistance: cueing for sequence and safety with cueing for awareness of task Transfers Transfers: Sit to Stand;Stand to Sit;Stand Pivot Transfers Sit to Stand: 4: Min assist;From bed;From chair/3-in-1;With armrests Stand to Sit: 2: Max assist;To chair/3-in-1;To bed Stand Pivot Transfers: 3: Mod assist Details for Transfer Assistance: cueing for hand placement, safety and sequence, pt not controlling descent   Ambulation/Gait Ambulation/Gait Assistance: 4: Min assist Ambulation Distance (Feet): 8 Feet (+1 to follow with chair) Assistive device: Rolling walker Ambulation/Gait Assistance Details: limited distance today due to lethargy and pt with jerking not controlling RW and pt assisted to chair and returned to bed Gait Pattern: Step-through pattern;Decreased stride length;Trunk flexed Gait velocity: decreased    Exercises     PT Diagnosis:    PT Problem List:   PT Treatment Interventions:     PT Goals Acute Rehab PT Goals PT Goal: Supine/Side to Sit - Progress: Not progressing PT Goal: Sit to Supine/Side - Progress: Not progressing PT Goal: Sit to Stand - Progress: Not progressing PT Goal: Stand to Sit - Progress: Not progressing PT Goal: Ambulate - Progress: Not progressing  Visit Information  Last PT Received On: 05/07/12 Assistance Needed: +2 (safety)    Subjective Data  Subjective: somebody woke me up   Cognition  Overall Cognitive Status: Impaired Area of Impairment: Attention;Memory Arousal/Alertness: Lethargic Orientation Level: Appears intact for tasks assessed Behavior During Session: Lethargic Current Attention Level: Focused Cognition - Other Comments: Pt lethargic today assumed due to pain meds. Difficulty waking pt initially and pt drifing off throughout eval without constant cueing to wake up and attend to task    Balance  Static Sitting Balance Static Sitting - Balance Support: Feet supported;No upper extremity supported Static Sitting - Level of Assistance: 2: Max assist Static Sitting - Comment/# of Minutes: Pt with posterior lean and not maintaining sitting balance despite cueing and required max assist to maintain upright grossly 7 min EOB  End of Session PT - End of Session Equipment Utilized During Treatment: Gait  belt Activity Tolerance: Patient limited by fatigue;Treatment limited secondary to medication Patient left: in bed;with bed alarm set Nurse  Communication: Mobility status   GP     [name] 05/07/2012, 11:36 AM [name], [place]

## 2012-05-07 NOTE — Progress Notes (Signed)
Clinical Social Work Department CLINICAL SOCIAL WORK PSYCHIATRY SERVICE LINE ASSESSMENT 05/07/2012  Patient:  [name]  Account:  [id number]  [place] Date:  05/03/2012  Clinical Social Worker:  Sindy Messing, LCSW  Date/Time:  05/07/2012 04:00 PM Referred by:  Physician  Date referred:  05/07/2012 Reason for Referral  Behavioral Health Issues   Presenting Symptoms/Problems (In the person's/family's own words):   Psych consulted due to no medical concerns but patient still has compliants.   Abuse/Neglect/Trauma History (check all that apply)  Denies history   Abuse/Neglect/Trauma Comments:   Psychiatric History (check all that apply)  Denies history   Psychiatric medications:  Xanax   Current Mental Health Hospitalizations/Previous Mental Health History:   Patient denies any MH diagnosis. Patient reports she has been taking Xanax to "take the edge off" for about 5 years.   Current provider:   PCP   Place and Date:   [place], [place]   Current Medications:   acetaminophen, dextrose, diazepam, HYDROmorphone (DILAUDID) injection, nitroGLYCERIN, ondansetron (ZOFRAN) IV, oxyCODONE, zolpidem                        . ALPRAZolam  0.25 mg Oral BID  . aspirin EC  81 mg Oral Daily  . atorvastatin  10 mg Oral q1800  . cefTRIAXone (ROCEPHIN)  IV  1 g Intravenous Q24H  . citalopram  20 mg Oral QHS  . enoxaparin (LOVENOX) injection  120 mg Subcutaneous Q12H  . gabapentin  300 mg Oral BID  . insulin aspart  0-5 Units Subcutaneous QHS  . insulin aspart  0-9 Units Subcutaneous TID WC  . isosorbide mononitrate  30 mg Oral Daily  . levothyroxine  25 mcg Oral Daily  . metoprolol  50 mg Oral BID  . multivitamin with minerals  1 tablet Oral Daily  . Muscle Rub   Topical TID  . omega-3 acid ethyl esters  1 g Oral Daily  . pantoprazole  40 mg Oral Q1200  . warfarin  10 mg Oral ONCE-1800  . Warfarin - Pharmacist Dosing Inpatient   Does not apply q1800   Previous Impatient  Admission/Date/Reason:   None reported   Emotional Health / Current Symptoms    Suicide/Self Harm  None reported   Suicide attempt in the past:   Other harmful behavior:   Psychotic/Dissociative Symptoms  None reported   Other Psychotic/Dissociative Symptoms:    Attention/Behavioral Symptoms  Within Normal Limits   Other Attention / Behavioral Symptoms:    Cognitive Impairment  Within Normal Limits   Other Cognitive Impairment:    Mood and Adjustment  Mood Congruent    Stress, Anxiety, Trauma, Any Recent Loss/Stressor  Relationship   Anxiety (frequency):   Phobia (specify):   Compulsive behavior (specify):   Obsessive behavior (specify):   Other:   Patient has strained relationship with son. Patient is not on speaking terms with son and son does not allow patient to visit granddtr.   Substance Abuse/Use  None   SBIRT completed (please refer for detailed history):  N  Self-reported substance use:   Patient denies all substance use.   Urinary Drug Screen Completed:  N Alcohol level:    Environmental/Housing/Living Arrangement  Stable housing   Who is in the home:   Alone   Emergency contact:  Insurance account manager  Medicaid   Patient's Strengths and Goals (patient's own words):   Patient has supportive sister and pastor.   Clinical Social Worker's Interpretive Summary:  CSW received referral to complete psychosocial assessment. CSW reviewed chart and met with patient at bedside. Doristine Bosworth was visiting patient but left when CSW completed assessment.    CSW introduced myself and explained role. Patient reports that she is agreeable to assessment. Patient was married from [phone] but ex-husband now lives in [place]. Patient has one son who is 57 years old but does not have contact with him. Patient and son's girlfriend did not get along and caused strained relationship between patient and son. Son has 66 year old dtr but does not allow patient to visit her.  Patient reports she still sends granddtr cards and gifts. Patient has supportive sister who assists her. Patient reports that pastor is also supportive role in her life.    Patient worked at [employer] for several years before retiring. Patient lived in her own home but when her mother became sick she become her sole caregiver. Patient reports it was stressful caring for her mother and that is when she was prescribed Xanax from her PCP. Patient reports that she continues to take medication. Patient reports that mother passed away about 5 years ago and several deaths of friends and family occurred around the same time. Patient denies any support groups or therapy in the past and reports that if she needs counseling then she relies on her pastor. Patient reports that she has 2 dogs that brighten her day and sister is currently caring for them. Patient reports "I try and be optimistic and not complain because it does not do any good."    Patient denies any MH or SA history. Patient denies any SI or HI and denies any psychotic features. Patient denies depression but has symptoms of depression such as losing interest in activities and feeling hopeless at times. Patient denies any outpatient follow up and politely declines and resources.    Patient was appropriate throughout assessment and maintained good eye contact. Patient's mood was appropriate and was engaged throughout session. It appears that patient could benefit from outpatient follow up but is not agreeable. Patient has strong informal supports that she relies on.    CSW and patient discussed dc plans. Patient refusing SNF and reports that she does not need it. Patient agreeable to [facility] so she can get back home to care for her animals. CSW updated unit CSW and CM of patient's plans.    CSW will staff case with psych MD and follow any recommendations provided.   Disposition:  Recommend Psych CSW continuing to support while in hospital

## 2012-05-07 NOTE — Progress Notes (Signed)
CRITICAL VALUE ALERT  Critical value received:  CBG 38  Date of notification:  05/07/2012  Time of notification:  0705  Critical value read back:yes  Nurse who received alert:  Toy Baker  MD notified (1st page):  Rai   Time of first page:  0708   MD notified (2nd page):  Time of second page:  Responding MD:  Tana Coast  Time MD responded:  [phone]

## 2012-05-07 NOTE — Progress Notes (Signed)
Patient ID: [name]  female  [id number]    DOB: 13-Oct-1955    DOA: 05/03/2012  PCP: [name], MD  Assessment/Plan:  Tremors with shakiness -  MRI of the brain and EEG done and both are unremarkable, no cerebellar CVA or any seizure activity respectively, she is on aspirin will continue - I also consulted neurology and per their evaluation, however neurology exam is nonfocal with many features to suggest functionality. I have called psychiatry consultation for her somatic issues.   - Continue Xanax for now  Hypoglycemia with insulin-dependent diabetes mellitus, with proteinuria - DC'd Amaryl, Lantus, change sliding scale insulin to sensitive - Started on D5 half-normal today  Acute kidney injury on Chronic renal insufficiency, stage IV (severe) - due to DM & HTN : Likely secondary to Lasix, metolazone and hypoglycemia - stop Lasix, placed on gentle hydration, recheck BMET in am  Chest pain, rule out cardiac, negative myoview in the past, LBBB, PHTN  W/u completed per Cardiology   Hypokalemia: Replaced  Epigastric/abdom pain  - Denies any abdominal pain today. Etiology unclear - pt has numerous complaints that are hard to reconcile with exam, she has had a cholecystectomy. Lipase is normal.     E coli UTI - pansensitive  Perhaps this is contributing to her abdom discomfort - cont Rocephin   Abnormal CXR  suspect atelectasis - possibly has been splinting due to abdominal/flank pain - no clinical sx to suggest PNA   Morbid obesity + sleep apnea  HTN - controlled   Chronic normocytic anemia: monitor counts  - will obtain anemia panel, Hb slightly down, likely dilutional  History of pulmonary embolism, recurrent, 3 seperate episodes/ DVT, bil in 08/2011  Presence of IVC filter, placed June 2011 after 3d PE  on Coumadin, VQ performed was normal  - INR is subtherapeutic today, discussed with pharmacy, will place on Lovenox until INR is therapeutic to bridge with  Coumadin  DVT Prophylaxis:  Code Status:  Disposition: Patient is distraught over her shakiness, unclear if she's able to go home, she will need 24 7 supervision, CIR consult placed yesterday   Subjective: Anxious over tremors and shaking, dropped her cup of juice while talking to me. No complaints of any pain, fever, chills hypoglycemia episodes today.  Objective: Weight change: 4.738 kg (10 lb 7.1 oz)  Intake/Output Summary (Last 24 hours) at 05/07/12 1136 Last data filed at 05/07/12 0800  Gross per 24 hour  Intake   1440 ml  Output    900 ml  Net    540 ml   Blood pressure 120/50, pulse 72, temperature 97.9 F (36.6 C), temperature source Oral, resp. rate 19, height 5\' 3"  (1.6 m), weight 120.838 kg (266 lb 6.4 oz), SpO2 93.00%.  Physical Exam: General: Alert and awake, oriented x3, not in any acute distress, anxious. CVS: S1-S2 clear, no murmur rubs or gallops Chest: CTA B.  Abdomen: soft nontender, nondistended, normal bowel sounds Extremities: no cyanosis, clubbing, edema noted bilaterally Neuro: Cranial nerves II-XII intact, strength 5 x 5 upper and lower activities bilaterally, tremors,  Lab Results: Basic Metabolic Panel:  Lab 0000000 0550 05/05/12 1637 05/03/12 1432  NA 131* 136 --  K 3.3* 4.0 --  CL 91* 95* --  CO2 30 30 --  GLUCOSE 38* 154* --  BUN 80* 70* --  CREATININE 2.42* 1.68* --  CALCIUM 8.7 9.3 --  MG -- -- 2.6*  PHOS -- -- --   Liver Function Tests:  Lab 05/03/12  0525  AST 17  ALT 12  ALKPHOS 74  BILITOT 0.6  PROT 7.6  ALBUMIN 3.4*    Lab 05/06/12 0610  LIPASE 15  AMYLASE --   No results found for this basename: AMMONIA:2 in the last 168 hours CBC:  Lab 05/06/12 0610 05/05/12 0630 05/03/12 0525  WBC 11.2* 11.3* --  NEUTROABS -- -- 11.3*  HGB 8.7* 9.9* --  HCT 27.3* 32.0* --  MCV 87.8 88.2 --  PLT 160 163 --   Cardiac Enzymes:  Lab 05/04/12 0200 05/03/12 1957 05/03/12 1435  CKTOTAL -- -- --  CKMB -- -- --  CKMBINDEX --  -- --  TROPONINI <0.30 <0.30 <0.30   BNP: No components found with this basename: POCBNP:2 CBG:  Lab 05/07/12 0804 05/07/12 0657 05/07/12 0554 05/07/12 0528 05/07/12 0501  GLUCAP 93 34* 55* 52* 63*     Micro Results: Recent Results (from the past 240 hour(s))  URINE CULTURE     Status: Normal   Collection Time   05/03/12 12:45 PM      Component Value Range Status Comment   Specimen Description URINE, CLEAN CATCH   Final    Special Requests NONE   Final    Culture  Setup Time 05/03/2012 13:33   Final    Colony Count >=100,000 COLONIES/ML   Final    Culture ESCHERICHIA COLI   Final    Report Status 05/05/2012 FINAL   Final    Organism ID, Bacteria ESCHERICHIA COLI   Final   MRSA PCR SCREENING     Status: Normal   Collection Time   05/03/12  1:35 PM      Component Value Range Status Comment   MRSA by PCR NEGATIVE  NEGATIVE Final     Studies/Results: Dg Chest 2 View  05/03/2012  *RADIOLOGY REPORT*  Clinical Data: Chest pain, shortness of breath.  CHEST - 2 VIEW  Comparison: 03/26/2012 CT  Findings: Heart size upper normal.  Pulmonary arterial prominence. Mild bibasilar opacities.  No definite pleural effusion or pneumothorax.  Limited osseous bowel evaluation without definite acute process  IMPRESSION: Mild lung base opacities; atelectasis (favored) versus infiltrate.  Pulmonary arterial hypertension.   Original Report Authenticated By: Carlos Levering, M.D.    Nm Pulmonary Perf And Vent  05/03/2012  *RADIOLOGY REPORT*  Clinical Data:  Shortness of breath  NM PULMONARY VENTILATION AND PERFUSION SCANS; ventilation study with re- breathing  Views:  Anterior and posterior:  Ventilation; anterior, posterior, right lateral, left lateral, RPO, LAO, LPO, RAO:  Perfusion  Radiopharmaceutical: Xenon 133 gas:  Ventilation; technetium 78m macroaggregated albumin:  Perfusion  Dose:  10.0 mCi:  Ventilation; 6.0 mCi:  Perfusion  Route of administration:  Inhalation:  Ventilation; intravenous:  Perfusion  Comparison:  Chest radiograph May 03, 2012  Findings: The ventilation study shows homogeneous and symmetric uptake of radiotracer with prompt washout bilaterally.  The perfusion study shows homogeneous and  symmetric uptake without appreciable perfusion defect.  There is no appreciable ventilation / perfusion mismatch.  IMPRESSION: Essentially normal ventilation and perfusion lung scans.  Very low probability of pulmonary embolus.   Original Report Authenticated By: Lowella Grip, M.D.     Medications: Scheduled Meds:    . ALPRAZolam  0.25 mg Oral BID  . aspirin EC  81 mg Oral Daily  . atorvastatin  10 mg Oral q1800  . cefTRIAXone (ROCEPHIN)  IV  1 g Intravenous Q24H  . citalopram  20 mg Oral QHS  . enoxaparin (LOVENOX) injection  120 mg Subcutaneous Q12H  . gabapentin  300 mg Oral BID  . insulin aspart  0-5 Units Subcutaneous QHS  . insulin aspart  0-9 Units Subcutaneous TID WC  . isosorbide mononitrate  30 mg Oral Daily  . levothyroxine  25 mcg Oral Daily  . metoprolol  50 mg Oral BID  . multivitamin with minerals  1 tablet Oral Daily  . Muscle Rub   Topical TID  . omega-3 acid ethyl esters  1 g Oral Daily  . pantoprazole  40 mg Oral Q1200  . warfarin  10 mg Oral ONCE-1800  . Warfarin - Pharmacist Dosing Inpatient   Does not apply q1800      LOS: 4 days   RAI,RIPUDEEP M.D. [facility] 05/07/2012, 11:36 AM Pager: [phone]  If 7PM-7AM, please contact night-coverage [email or web address] Password [email or web address]

## 2012-05-07 NOTE — Progress Notes (Signed)
ANTICOAGULATION CONSULT NOTE - Follow Up Consult  Pharmacy Consult for Coumadin Indication: chest pain/ACS, hx of multiple PE's with IVC in place    Allergies  Allergen Reactions  . Morphine And Related Rash    Patient Measurements: Height: 5\' 3"  (160 cm) Weight: 266 lb 6.4 oz (120.838 kg) IBW/kg (Calculated) : 52.4  Heparin Dosing Weight:  Vital Signs: Temp: 97.9 F (36.6 C) (01/31 0557) Temp src: Oral (01/31 0557) BP: 115/46 mmHg (01/31 0557) Pulse Rate: 53  (01/31 0557)  Labs:  Basename 05/07/12 0550 05/06/12 0610 05/05/12 1637 05/05/12 0630  HGB -- 8.7* -- 9.9*  HCT -- 27.3* -- 32.0*  PLT -- 160 -- 163  APTT -- -- -- --  LABPROT 20.9* 23.7* -- 30.8*  INR 1.88* 2.23* -- 3.17*  HEPARINUNFRC -- -- -- --  CREATININE 2.42* -- 1.68* 1.60*  CKTOTAL -- -- -- --  CKMB -- -- -- --  TROPONINI -- -- -- --    Estimated Creatinine Clearance: 32.7 ml/min (by C-G formula based on Cr of 2.42).   Assessment: CP & SOB PMH: CHF, DM, OA, HTN, depression, DVT, PE, CKD, afib, OSA  AC: Lovenox/Coumadin PTA for hx PE/DVT, Afib. INR 1.88 down. Hgb down to 8.7. Coumadin resumed as no longer planning cath. Last dose of Coumadin 1/26 and only 2.5mg  ordered 1/29. Dr. Tana Coast ok with adding Lovenox temporarily until INR again>2.  ID: Ecoli UTI - Afebrile. WBC 8.7. 1/27 ceftriaxone>> 1/27 urine - e coli  CV: ACS ruled out, HTN, still with vague chest pain now mainly in abdomen - ?neuropathic. No further cardiac issues or workup planned. BP 115/46, HR 53 this am. Meds: ASA 81mg , Lipitor, Imdur, metoprolol, Lovaza  GI: c/o severe abdom pain which extends across her abdom. Lipase normal. +PPI.  Endo:DM - CBG hypoglycemic down to 34 this am, currently 93, synthroid resumed Meds: SSI, Amaryl, Lantus  Neuro-? Neuropathic pain - added gabapentin - patient educated on new med d/t multiple questions on "what is wrong with her" very emotional about being unable to find the source of her pain. Also on  Celexa. Anxious with tremors and shakiness  Renal:Stage IV CKD, SCr jumped to 2.42 (calculated CrCl 32)  Pulm:OSA, VQ scan was normal, IVC filter placed in past after 3rd PE  Heme/Onc:Hgb continues to drop.  Other: home meds addressed, lasix/zaroxyln on hold  Goal of Therapy:  INR 2-3 Monitor platelets by anticoagulation protocol: Yes  Goal of Therapy:  INR 2-3 Monitor platelets by anticoagulation protocol: Yes   Plan:  Coumadin 10mg  po x 1 tonight Lovenox 120mg  SQ q12hrs until INR>2. Will closely monitor CrCl  Eilene Ghazi Stillinger 05/07/2012,8:34 AM

## 2012-05-07 NOTE — Progress Notes (Signed)
LOS approaching 5 days support obtained.  Thank you! Laurisa Sahakian J. Clydene Laming, [place], [place], [employer] [phone]

## 2012-05-07 NOTE — Progress Notes (Signed)
Occupational Therapy Treatment Patient Details Name: [name] MRN: [id number] DOB: 09-18-1955 Today's Date: 05/07/2012 Time: [id number] OT Time Calculation (min): 26 min  OT Assessment / Plan / Recommendation Comments on Treatment Session This 57 yo female admitted with CP, UTI and atelectasis presents to acute OT today with limitations due to lethargy. Will benefit from continued OT at SNF or HHOT if has 24 S/A.    Follow Up Recommendations  Home health OT;Supervision/Assistance - 24 hour (if doesn't have then will need SNF))    Barriers to Discharge   Decreased care-giver support?    Equipment Recommendations  3 in 1 bedside comode       Frequency Min 2X/week   Plan Discharge plan remains appropriate    Precautions / Restrictions Precautions Precautions: Fall Restrictions Weight Bearing Restrictions: No       ADL  Grooming: Performed;Set up Where Assessed - Grooming: Supine, head of bed up Lower Body Dressing: Performed;+1 Total assistance (Pt was "falling asleep" while trying to don socks/shoes) Where Assessed - Lower Body Dressing: Unsupported sitting Toilet Transfer: Simulated;Minimal assistance Toilet Transfer Method: Stand pivot Toilet Transfer Equipment:  (Bed to recliner, if with ambulation good to follow w/chair) Equipment Used: Rolling walker;Gait belt Transfers/Ambulation Related to ADLs: Min A sit to stand, Max A stand to sit due to decreased control of descent ADL Comments: UEs remain ataxic, however per PT whom I was co-treating with it is better than it was yesterday)      OT Goals  Pt not progressing today due to lethargy  Visit Information  Last OT Received On: 05/07/12 Assistance Needed: +2 (safety) PT/OT Co-Evaluation/Treatment: Yes    Subjective Data  Subjective: I want to sleep (evidently per pt, nursing had recently given her pain meds and she was quite sleepy)      Cognition  Overall Cognitive Status: Impaired Area of Impairment:  Attention;Memory Arousal/Alertness: Lethargic Orientation Level: Appears intact for tasks assessed Behavior During Session: Lethargic Current Attention Level: Focused Cognition - Other Comments: Pt lethargic today assumed due to pain meds. Difficulty waking pt initially and pt drifing off throughout eval without constant cueing to wake up and attend to task    Mobility   Bed Mobility Bed Mobility: Supine to Sit;Sit to Supine Supine to Sit: 4: Min assist;HOB elevated (HOB 30degrees) Sitting - Scoot to Edge of Bed: 4: Min guard Sit to Supine: 4: Min assist;HOB flat Details for Bed Mobility Assistance: cueing for sequence and safety with cueing for awareness of task Transfers Sit to Stand: 4: Min assist;From bed;From chair/3-in-1;With armrests Stand to Sit: 2: Max assist;To chair/3-in-1;To bed Details for Transfer Assistance: cueing for hand placement, safety and sequence, pt not controlling descent           Balance Static Sitting Balance Static Sitting - Balance Support: Feet supported;No upper extremity supported Static Sitting - Level of Assistance: 2: Max assist (due to lethargy) Static Sitting - Comment/# of Minutes: Pt with posterior lean and not maintaining sitting balance despite cueing and required max assist to maintain upright grossly 7 min EOB   End of Session OT - End of Session Equipment Utilized During Treatment: Gait belt (RW) Activity Tolerance:  (treatment limited due to lethargy) Patient left: in bed;with call bell/phone within reach;with bed alarm set       Almon Register [id number] 05/07/2012, 11:49 AM

## 2012-05-07 NOTE — Procedures (Signed)
Dorian Pod, MD 05/07/2012 8:53 AM EEG report.  Brief clinical history: 57 years old female admitted to the  hospital due to chest pain, who developed new onset tremors and shakiness after admission.  Technique: this is a 17 channel routine scalp EEG performed at  the bedside with bipolar and monopolar montages arranged in  accordance to the international 10/20 system of electrode  placement. One channel was dedicated to EKG recording.  The study was performed during wakefulness and sleep. Intermittent photic stimulation was the sole activating procedure  employed during the test.  Description: In the wakeful state, the best background consisted  of a posterior dominant, medium amplitude, well developed,  symmetric, and reactive 10 Hz rhythm. The study is contaminated by significant amount of myogenic artifact. The sleep portion of the test demonstrated normal architecture  without intermixed epileptiform discharges..  Intermittent photic stimulation did not induce a driving  response.  Hyperventilation was not carried out. No focal or generalized epileptiform discharges noted.  No pathologic areas of slowing seen.  EKG showed sinus rhythm.  Impression: this is a normal awake and asleep EEG. Please, be  aware that a normal EEG does not exclude the possibility of  epilepsy.  Clinical correlation is advised.  Dorian Pod, MD

## 2012-05-07 NOTE — Progress Notes (Signed)
Throughout the shift patient had low CBG's with no symptoms of which the Mid level MD was paged and notified over the phone.  Patient remained A/O x 4 throughout the shift and responsive to voice and touch. Notified on call  Mid-level for [facility] for low CBG's. Was told to continue to follow hypoglycemia protocol by giving patient snacks. Orders written to assess patients blood sugars also. Even after receiving snacks patient's morning CBG at 0657 was 34. Lab also called to report critical lab value of 38. MD paged for [facility]. Patient received dextrose 50%. Oncoming nurse was notified and made aware to continue to monitor.

## 2012-05-08 LAB — BASIC METABOLIC PANEL
CO2: 29 mEq/L (ref 19–32)
Chloride: 90 mEq/L — ABNORMAL LOW (ref 96–112)
Creatinine, Ser: 2.09 mg/dL — ABNORMAL HIGH (ref 0.50–1.10)
GFR calc Af Amer: 29 mL/min — ABNORMAL LOW (ref >90)
Potassium: 3.7 mEq/L (ref 3.5–5.1)

## 2012-05-08 LAB — CBC
HCT: 26.6 % — ABNORMAL LOW (ref 36.0–46.0)
Hemoglobin: 8.3 g/dL — ABNORMAL LOW (ref 12.0–15.0)
MCV: 87.5 fL (ref 78.0–100.0)
RDW: 16.1 % — ABNORMAL HIGH (ref 11.5–15.5)
WBC: 7.2 10*3/uL (ref 4.0–10.5)

## 2012-05-08 LAB — GLUCOSE, CAPILLARY
Glucose-Capillary: 44 mg/dL — CL (ref 70–99)
Glucose-Capillary: 66 mg/dL — ABNORMAL LOW (ref 70–99)
Glucose-Capillary: 87 mg/dL (ref 70–99)
Glucose-Capillary: 104 mg/dL — ABNORMAL HIGH (ref 70–99)
Glucose-Capillary: 304 mg/dL — ABNORMAL HIGH (ref 70–99)

## 2012-05-08 LAB — PROTIME-INR
INR: 2.5 — ABNORMAL HIGH (ref 0.00–1.49)
Prothrombin Time: 25.8 seconds — ABNORMAL HIGH (ref 11.6–15.2)

## 2012-05-08 LAB — C-PEPTIDE: C-Peptide: 24.9 ng/mL — ABNORMAL HIGH (ref 0.80–3.90)

## 2012-05-08 MED ORDER — GUAIFENESIN-DM 100-10 MG/5ML PO SYRP
5.0000 mL | ORAL_SOLUTION | ORAL | Status: DC | PRN
  Administered 2012-05-08 – 2012-05-09 (×2): 5 mL via ORAL
  Filled 2012-05-08 (×2): qty 5

## 2012-05-08 MED ORDER — LEVALBUTEROL HCL 0.63 MG/3ML IN NEBU
0.6300 mg | INHALATION_SOLUTION | Freq: Four times a day (QID) | RESPIRATORY_TRACT | Status: DC
  Administered 2012-05-08 – 2012-05-10 (×7): 0.63 mg via RESPIRATORY_TRACT
  Filled 2012-05-08 (×8): qty 3

## 2012-05-08 MED ORDER — OCTREOTIDE ACETATE 50 MCG/ML IJ SOLN
25.0000 ug | Freq: Once | INTRAMUSCULAR | Status: AC
  Administered 2012-05-08: 25 ug via SUBCUTANEOUS
  Filled 2012-05-08: qty 0.5

## 2012-05-08 MED ORDER — WARFARIN SODIUM 5 MG PO TABS
5.0000 mg | ORAL_TABLET | Freq: Once | ORAL | Status: AC
  Administered 2012-05-08: 5 mg via ORAL
  Filled 2012-05-08: qty 1

## 2012-05-08 NOTE — Progress Notes (Signed)
ANTICOAGULATION CONSULT NOTE - Follow Up Consult  Pharmacy Consult for Lovenox/Warfarin Indication: H/O of multiple VTE  Allergies  Allergen Reactions  . Morphine And Related Rash    Patient Measurements: Height: 5\' 3"  (160 cm) Weight: 270 lb 6.4 oz (122.653 kg) IBW/kg (Calculated) : 52.4   Vital Signs: Temp: 98.6 F (37 C) (02/01 0455) Temp src: Oral (02/01 0455) BP: 129/63 mmHg (02/01 0952) Pulse Rate: 74  (02/01 0952)  Labs:  Basename 05/08/12 0545 05/07/12 0550 05/06/12 0610 05/05/12 1637  HGB 8.3* -- 8.7* --  HCT 26.6* -- 27.3* --  PLT 170 -- 160 --  APTT -- -- -- --  LABPROT 25.8* 20.9* 23.7* --  INR 2.50* 1.88* 2.23* --  HEPARINUNFRC -- -- -- --  CREATININE 2.09* 2.42* -- 1.68*  CKTOTAL -- -- -- --  CKMB -- -- -- --  TROPONINI -- -- -- --    Estimated Creatinine Clearance: 38.2 ml/min (by C-G formula based on Cr of 2.09).   Medications:  Scheduled:    . ALPRAZolam  0.25 mg Oral BID  . aspirin EC  81 mg Oral Daily  . atorvastatin  10 mg Oral q1800  . cefTRIAXone (ROCEPHIN)  IV  1 g Intravenous Q24H  . citalopram  20 mg Oral QHS  . [COMPLETED] dextrose      . enoxaparin (LOVENOX) injection  120 mg Subcutaneous Q12H  . gabapentin  300 mg Oral BID  . insulin aspart  0-5 Units Subcutaneous QHS  . insulin aspart  0-9 Units Subcutaneous TID WC  . isosorbide mononitrate  30 mg Oral Daily  . levothyroxine  25 mcg Oral Daily  . metoprolol  50 mg Oral BID  . multivitamin with minerals  1 tablet Oral Daily  . Muscle Rub   Topical TID  . [COMPLETED] octreotide  25 mcg Subcutaneous Once  . omega-3 acid ethyl esters  1 g Oral Daily  . pantoprazole  40 mg Oral Q1200  . [COMPLETED] warfarin  10 mg Oral ONCE-1800  . Warfarin - Pharmacist Dosing Inpatient   Does not apply q1800    Assessment: 57 y/o F with h/o multiple VTE (PE and DVT) who is on Lovenox and Warfarin per Pharmacy. INR today is 2.50 from 1.88, will DC Lovenox if INR remains therapeutic on 05/09/12.  Scr today is 2.06 with CrCl ~ 35-40. CBC low and stable, no bleeding noted.   PTA Warfarin Dose: 5mg  on Tues/Thurs/Sat and 7.5mg  all other days.   Goal of Therapy:  INR 2-3 Monitor platelets by anticoagulation protocol: Yes   Plan:  - Warfarin 5mg  x 1 tonight at 1800 - Continue Lovenox 120mg  [place] q12h, DC tomorrow if INR remains >2 (monitor renal function closely) - Daily PT/INR - Monitor for bleeding  Thank you for the consult,   Narda Bonds, PharmD Clinical Pharmacist Phone: [phone] Pager: [phone] 05/08/2012 10:08 AM

## 2012-05-08 NOTE — Progress Notes (Signed)
Pt CBG level at 44, pt very drowsy at this time, hypoglycemic protocol started.

## 2012-05-08 NOTE — Progress Notes (Signed)
Patient ID: [name]  female  [id number]    DOB: June 01, 1955    DOA: 05/03/2012  PCP: [name], MD  Assessment/Plan:  Tremors with shakiness- appears somatic/functional -  MRI of the brain and EEG done and both are unremarkable, no cerebellar CVA or any seizure activity respectively, she is on aspirin will continue - Neurology consulted and per their evaluation, neurology exam is nonfocal with many features to suggest functionality. - Psychiatry consultation obtained, during her encounter with Dr. Evern Bio, the patient had a phone call from her brother and her uncle and she held the cell phone and room phone in her hands fine, also held her cup in one hand. During my encounter, she continues to have shaking and anxious about it, feels that she cannot manage at home and wants to go to rehabilitation now. She continues to ask that she probably has MS (yesterday) or fibromyalgia (today), I explained that it is not and even neurology has evaluated her.  - Continue Xanax for now  Hypoglycemia with insulin-dependent diabetes mellitus, with proteinuria; continues to be hypoglycemic, Amaryl was discontinued yesterday. - Will give one dose of octreotide, after blood sugars are improved, DC D5 half normal - Continue sliding scale insulin only  Acute kidney injury on Chronic renal insufficiency, stage IV (severe) - due to DM & HTN : Likely secondary to Lasix, metolazone and hypoglycemia - stopped Lasix, placed on gentle hydration, improving  Chest pain, rule out cardiac, negative myoview in the past, LBBB, PHTN  W/u completed per Cardiology   Epigastric/abdom pain : NONE since I have been following her - Denies any abdominal pain, pt has numerous complaints that are hard to reconcile with exam, she has had a cholecystectomy. Lipase is normal.     E coli UTI - pansensitive  Perhaps this is contributing to her abdom discomfort - cont Rocephin   Abnormal CXR  suspect atelectasis - possibly  has been splinting due to abdominal/flank pain - no clinical sx to suggest PNA   Morbid obesity + sleep apnea  HTN - controlled   Chronic normocytic anemia: monitor counts  - will obtain anemia panel, Hb slightly down, likely dilutional  History of pulmonary embolism, recurrent, 3 seperate episodes/ DVT, bil in 08/2011  Presence of IVC filter, placed June 2011 after 3d PE  on Coumadin, VQ performed was normal  -Coumadin and Lovenox per pharmacy  DVT Prophylaxis:  Code Status:  Disposition: I will ask PT and OT evaluation again today, patient now feels that she cannot manage herself at home and wants to go to short-term rehabilitation. I will also consult social worker.   Subjective: No complaints of any pain, fever, chills. still hypoglycemia episodes last night, still shaking in hands (?functional).  Objective: Weight change: 1.814 kg (4 lb)  Intake/Output Summary (Last 24 hours) at 05/08/12 1133 Last data filed at 05/08/12 [id number]  Gross per 24 hour  Intake   2675 ml  Output   1550 ml  Net   1125 ml   Blood pressure 129/63, pulse 74, temperature 98.6 F (37 C), temperature source Oral, resp. rate 18, height 5\' 3"  (1.6 m), weight 122.653 kg (270 lb 6.4 oz), SpO2 96.00%.  Physical Exam: General: AXO X3, NAD, anxious. CVS: S1-S2 clear, no murmur rubs or gallops Chest: CTA B.  Abdomen: NT, ND, NBS Extremities: no cyanosis, clubbing, edema noted bilaterally Neuro: Cranial nerves II-XII intact, strength 5 x 5 upper and lower activities bilaterally,   Lab Results: Basic Metabolic Panel:  Lab 05/08/12 0545 05/07/12 0550 05/03/12 1432  NA 129* 131* --  K 3.7 3.3* --  CL 90* 91* --  CO2 29 30 --  GLUCOSE 75 38* --  BUN 77* 80* --  CREATININE 2.09* 2.42* --  CALCIUM 8.7 8.7 --  MG -- -- 2.6*  PHOS -- -- --   Liver Function Tests:  Lab 05/03/12 0525  AST 17  ALT 12  ALKPHOS 74  BILITOT 0.6  PROT 7.6  ALBUMIN 3.4*    Lab 05/06/12 0610  LIPASE 15  AMYLASE --    No results found for this basename: AMMONIA:2 in the last 168 hours CBC:  Lab 05/08/12 0545 05/06/12 0610 05/03/12 0525  WBC 7.2 11.2* --  NEUTROABS -- -- 11.3*  HGB 8.3* 8.7* --  HCT 26.6* 27.3* --  MCV 87.5 87.8 --  PLT 170 160 --   Cardiac Enzymes:  Lab 05/04/12 0200 05/03/12 1957 05/03/12 1435  CKTOTAL -- -- --  CKMB -- -- --  CKMBINDEX -- -- --  TROPONINI <0.30 <0.30 <0.30   BNP: No components found with this basename: POCBNP:2 CBG:  Lab 05/08/12 1107 05/08/12 0540 05/08/12 0400 05/07/12 2354 05/07/12 2324  GLUCAP 104* 143* 56* 87 66*     Micro Results: Recent Results (from the past 240 hour(s))  URINE CULTURE     Status: Normal   Collection Time   05/03/12 12:45 PM      Component Value Range Status Comment   Specimen Description URINE, CLEAN CATCH   Final    Special Requests NONE   Final    Culture  Setup Time 05/03/2012 13:33   Final    Colony Count >=100,000 COLONIES/ML   Final    Culture ESCHERICHIA COLI   Final    Report Status 05/05/2012 FINAL   Final    Organism ID, Bacteria ESCHERICHIA COLI   Final   MRSA PCR SCREENING     Status: Normal   Collection Time   05/03/12  1:35 PM      Component Value Range Status Comment   MRSA by PCR NEGATIVE  NEGATIVE Final     Studies/Results: Dg Chest 2 View  05/03/2012  *RADIOLOGY REPORT*  Clinical Data: Chest pain, shortness of breath.  CHEST - 2 VIEW  Comparison: 03/26/2012 CT  Findings: Heart size upper normal.  Pulmonary arterial prominence. Mild bibasilar opacities.  No definite pleural effusion or pneumothorax.  Limited osseous bowel evaluation without definite acute process  IMPRESSION: Mild lung base opacities; atelectasis (favored) versus infiltrate.  Pulmonary arterial hypertension.   Original Report Authenticated By: Carlos Levering, M.D.    Nm Pulmonary Perf And Vent  05/03/2012  *RADIOLOGY REPORT*  Clinical Data:  Shortness of breath  NM PULMONARY VENTILATION AND PERFUSION SCANS; ventilation study with  re- breathing  Views:  Anterior and posterior:  Ventilation; anterior, posterior, right lateral, left lateral, RPO, LAO, LPO, RAO:  Perfusion  Radiopharmaceutical: Xenon 133 gas:  Ventilation; technetium 25m macroaggregated albumin:  Perfusion  Dose:  10.0 mCi:  Ventilation; 6.0 mCi:  Perfusion  Route of administration:  Inhalation:  Ventilation; intravenous: Perfusion  Comparison:  Chest radiograph May 03, 2012  Findings: The ventilation study shows homogeneous and symmetric uptake of radiotracer with prompt washout bilaterally.  The perfusion study shows homogeneous and  symmetric uptake without appreciable perfusion defect.  There is no appreciable ventilation / perfusion mismatch.  IMPRESSION: Essentially normal ventilation and perfusion lung scans.  Very low probability of pulmonary embolus.  Original Report Authenticated By: Lowella Grip, M.D.     Medications: Scheduled Meds:    . ALPRAZolam  0.25 mg Oral BID  . aspirin EC  81 mg Oral Daily  . atorvastatin  10 mg Oral q1800  . cefTRIAXone (ROCEPHIN)  IV  1 g Intravenous Q24H  . citalopram  20 mg Oral QHS  . enoxaparin (LOVENOX) injection  120 mg Subcutaneous Q12H  . gabapentin  300 mg Oral BID  . insulin aspart  0-5 Units Subcutaneous QHS  . insulin aspart  0-9 Units Subcutaneous TID WC  . isosorbide mononitrate  30 mg Oral Daily  . levothyroxine  25 mcg Oral Daily  . metoprolol  50 mg Oral BID  . multivitamin with minerals  1 tablet Oral Daily  . Muscle Rub   Topical TID  . omega-3 acid ethyl esters  1 g Oral Daily  . pantoprazole  40 mg Oral Q1200  . warfarin  5 mg Oral ONCE-1800  . Warfarin - Pharmacist Dosing Inpatient   Does not apply q1800      LOS: 5 days   Carrell Palmatier M.D. [facility] 05/08/2012, 11:33 AM Pager: [phone]  If 7PM-7AM, please contact night-coverage [email or web address] Password [email or web address]

## 2012-05-08 NOTE — Progress Notes (Signed)
Pt had some confusion, CBG check with blood sugar level of 56 at this time D50 given IV. Pt AO x 3 no distress noticed. We'll continue to monitor.

## 2012-05-08 NOTE — Progress Notes (Signed)
CBG 143 after D 50% IV given no coverage given at this time.

## 2012-05-09 ENCOUNTER — Inpatient Hospital Stay (HOSPITAL_COMMUNITY): Payer: Medicaid Other

## 2012-05-09 LAB — BASIC METABOLIC PANEL
Calcium: 8.3 mg/dL — ABNORMAL LOW (ref 8.4–10.5)
Creatinine, Ser: 2.05 mg/dL — ABNORMAL HIGH (ref 0.50–1.10)
GFR calc Af Amer: 30 mL/min — ABNORMAL LOW (ref >90)
GFR calc non Af Amer: 26 mL/min — ABNORMAL LOW (ref >90)
Sodium: 124 mEq/L — ABNORMAL LOW (ref 135–145)

## 2012-05-09 LAB — PROTIME-INR
INR: 3.19 — ABNORMAL HIGH (ref 0.00–1.49)
Prothrombin Time: 30.9 seconds — ABNORMAL HIGH (ref 11.6–15.2)

## 2012-05-09 LAB — CBC
Platelets: 154 10*3/uL (ref 150–400)
RBC: 2.77 MIL/uL — ABNORMAL LOW (ref 3.87–5.11)
RDW: 16 % — ABNORMAL HIGH (ref 11.5–15.5)
WBC: 6.6 10*3/uL (ref 4.0–10.5)

## 2012-05-09 LAB — VITAMIN B12: Vitamin B-12: 716 pg/mL (ref 211–911)

## 2012-05-09 LAB — IRON AND TIBC
Iron: 14 ug/dL — ABNORMAL LOW (ref 42–135)
Saturation Ratios: 7 % — ABNORMAL LOW (ref 20–55)
TIBC: 188 ug/dL — ABNORMAL LOW (ref 250–470)

## 2012-05-09 LAB — GLUCOSE, CAPILLARY
Glucose-Capillary: 346 mg/dL — ABNORMAL HIGH (ref 70–99)
Glucose-Capillary: 303 mg/dL — ABNORMAL HIGH (ref 70–99)

## 2012-05-09 LAB — RETICULOCYTES: Retic Ct Pct: 1.4 % (ref 0.4–3.1)

## 2012-05-09 LAB — FOLATE: Folate: >20 ng/mL

## 2012-05-09 MED ORDER — INSULIN GLARGINE 100 UNIT/ML ~~LOC~~ SOLN
20.0000 [IU] | Freq: Every day | SUBCUTANEOUS | Status: DC
  Administered 2012-05-09 – 2012-05-10 (×2): 20 [IU] via SUBCUTANEOUS

## 2012-05-09 MED ORDER — INSULIN ASPART 100 UNIT/ML ~~LOC~~ SOLN
0.0000 [IU] | Freq: Three times a day (TID) | SUBCUTANEOUS | Status: DC
  Administered 2012-05-09: 11 [IU] via SUBCUTANEOUS
  Administered 2012-05-09: 5 [IU] via SUBCUTANEOUS
  Administered 2012-05-10 – 2012-05-11 (×4): 3 [IU] via SUBCUTANEOUS
  Administered 2012-05-11: 5 [IU] via SUBCUTANEOUS

## 2012-05-09 MED ORDER — INSULIN ASPART 100 UNIT/ML ~~LOC~~ SOLN
3.0000 [IU] | Freq: Three times a day (TID) | SUBCUTANEOUS | Status: DC
  Administered 2012-05-09 – 2012-05-10 (×4): 3 [IU] via SUBCUTANEOUS

## 2012-05-09 NOTE — Progress Notes (Signed)
Pts. Heart rate down to 48 not sustained.  Pt. Asleep laying in bed.  HR back up 50s-60s.  Will continue to monitor patient.

## 2012-05-09 NOTE — Progress Notes (Signed)
Pt. Spilled coffee on her chest this morning.  Pt stated it was burning slightly and upon assessment, her mid chest was slightly pink.  After cool compress and cleaning her chest/changing her gown, skin color back to normal but patient still complaining of some burning.  Dr. Tana Coast paged, will continue to monitor patient.

## 2012-05-09 NOTE — Clinical Social Work Placement (Addendum)
Clinical Social Work Department CLINICAL SOCIAL WORK PLACEMENT NOTE 05/09/2012  Patient:  [name], [name]  Account Number:  [id number] Admit date:  05/03/2012  Clinical Social Worker:  Leitha Schuller  Date/time:  05/09/2012 11:50 AM  Clinical Social Work is seeking post-discharge placement for this patient at the following level of care:   SKILLED NURSING   (*CSW will update this form in Epic as items are completed)   05/09/2012  Patient/family provided with [place] Department of Clinical Social Work's list of facilities offering this level of care within the geographic area requested by the patient (or if unable, by the patient's family).  05/09/2012  Patient/family informed of their freedom to choose among providers that offer the needed level of care, that participate in Medicare, Medicaid or managed care program needed by the patient, have an available bed and are willing to accept the patient.  05/09/2012  Patient/family informed of [facility]' ownership interest in [facility], as well as of the fact that they are under no obligation to receive care at this facility.  PASARR submitted to [employer] on 08/28/2011 PASARR number received from [employer] on 08/28/2011  FL2 transmitted to all facilities in geographic area requested by pt/family on  05/09/2012 FL2 transmitted to all facilities within larger geographic area on   Patient informed that his/her managed care company has contracts with or will negotiate with  certain facilities, including the following:     Patient/family informed of bed offers received:   Patient chooses bed at  Physician recommends and patient chooses bed at    Patient to be transferred to  on   Patient to be transferred to facility by   The following physician request were entered in Epic:   Additional Comments: Patient has an [place] number  Patrick Jupiter, [place] (weekend)

## 2012-05-09 NOTE — Progress Notes (Signed)
Attempted to flush pt's iv to set up for blood tx.  Noted it was leaking.  CN notified IV team for new access.  Karie Kirks, Therapist, sports.

## 2012-05-09 NOTE — Progress Notes (Signed)
ANTICOAGULATION CONSULT NOTE - Follow Up Consult  Pharmacy Consult for Lovenox/Warfarin Indication: H/O of multiple VTE  Allergies  Allergen Reactions  . Morphine And Related Rash    Patient Measurements: Height: 5\' 3"  (160 cm) Weight: 273 lb 2.4 oz (123.9 kg) IBW/kg (Calculated) : 52.4   Vital Signs: Temp: 98.9 F (37.2 C) (02/02 0431) Temp src: Axillary (02/02 0431) BP: 130/52 mmHg (02/02 0431) Pulse Rate: 68  (02/02 0431)  Labs:  Basename 05/09/12 0505 05/08/12 0545 05/07/12 0550  HGB 7.8* 8.3* --  HCT 23.7* 26.6* --  PLT 154 170 --  APTT -- -- --  LABPROT 30.9* 25.8* 20.9*  INR 3.19* 2.50* 1.88*  HEPARINUNFRC -- -- --  CREATININE 2.05* 2.09* 2.42*  CKTOTAL -- -- --  CKMB -- -- --  TROPONINI -- -- --    Estimated Creatinine Clearance: 39.2 ml/min (by C-G formula based on Cr of 2.05).   Medications:  Scheduled:     . ALPRAZolam  0.25 mg Oral BID  . aspirin EC  81 mg Oral Daily  . atorvastatin  10 mg Oral q1800  . cefTRIAXone (ROCEPHIN)  IV  1 g Intravenous Q24H  . citalopram  20 mg Oral QHS  . enoxaparin (LOVENOX) injection  120 mg Subcutaneous Q12H  . gabapentin  300 mg Oral BID  . insulin aspart  0-15 Units Subcutaneous TID WC  . insulin aspart  0-5 Units Subcutaneous QHS  . insulin aspart  3 Units Subcutaneous TID WC  . insulin glargine  20 Units Subcutaneous Daily  . isosorbide mononitrate  30 mg Oral Daily  . levalbuterol  0.63 mg Nebulization Q6H  . levothyroxine  25 mcg Oral Daily  . metoprolol  50 mg Oral BID  . multivitamin with minerals  1 tablet Oral Daily  . Muscle Rub   Topical TID  . omega-3 acid ethyl esters  1 g Oral Daily  . pantoprazole  40 mg Oral Q1200  . [COMPLETED] warfarin  5 mg Oral ONCE-1800  . Warfarin - Pharmacist Dosing Inpatient   Does not apply q1800  . [DISCONTINUED] insulin aspart  0-9 Units Subcutaneous TID WC    Assessment: 57 y/o F with h/o multiple VTE (PE and DVT) who is on Lovenox and Warfarin per Pharmacy.  INR today is 3.19 from 2.50 from 1.88. Scr today is 2.05 with CrCl ~ 35-40. CBC low and stable, no bleeding noted. INR jump is likely due to patient was given doses on 12.5mg  and 10mg  which are not in line with home dosing. I re-started home dosing yesterday, but still saw marked increase in INR.   PTA Warfarin Dose: 5mg  on Tues/Thurs/Sat and 7.5mg  all other days.   Goal of Therapy:  INR 2-3 Monitor platelets by anticoagulation protocol: Yes   Plan:  - Hold warfarin tonight, likely need to re-start home regimen tomorrow if INR comes back into range - DC Lovenox as INR has been >2 for 24 hours - Daily PT/INR - Monitor for bleeding  Thank you for the consult,   Narda Bonds, PharmD Clinical Pharmacist Phone: [phone] Pager: [phone] 05/09/2012 8:50 AM

## 2012-05-09 NOTE — Progress Notes (Addendum)
Notified Dr. Tana Coast that pt's temp 99.0.  Instructed to give her some tylenol. Pt received 650 prn med.  Will continue to monitor.  Karie Kirks, RN

## 2012-05-09 NOTE — Progress Notes (Signed)
Called blood bank and spoke with [facility] and she stated blood not ready yet because she has to repeat screen and will call us when the blood is ready.  Karie Kirks, Therapist, sports.

## 2012-05-09 NOTE — Progress Notes (Signed)
Patient ID: [name]  female  [id number]    DOB: 07/12/1955    DOA: 05/03/2012  PCP: [name], MD  Assessment/Plan:  Tremors with shakiness- appears somatic/functional -  MRI of the brain and EEG done and both are unremarkable, no cerebellar CVA or any seizure activity respectively, she is on aspirin will continue - Neurology consulted and per their evaluation, neurology exam is nonfocal with many features to suggest functionality. - Psychiatry consultation obtained, also feel patient has somatic issues and can make decisions  - Continue Xanax for now  Hypoglycemia with insulin-dependent diabetes mellitus, with proteinuria; Resolved, now with hyper glycemia  -Restart her Lantus, NovoLog with meals and sliding scale  Hyponatremia: Corrected sodium with glucose of 356 is 128, likely down due to dilutional effect and possibly some SIADH - Obtain serum osmolarity, urine osmolarity, urine sodium - Chest x-ray clear, no pneumonia or emphysema  Anemia: Likely dilutional effect, hemoglobin 7.8 - I will obtain anemia panel, stool occult test, one unit of packed RBCs  Acute kidney injury on Chronic renal insufficiency, stage IV (severe) - due to DM & HTN : Likely secondary to Lasix, metolazone and hypoglycemia, improving - Hold Lasix for today as creatinine is still not at baseline but no IV fluids  Chest pain, rule out cardiac, negative myoview in the past, LBBB, PHTN  W/u completed per Cardiology   Epigastric/abdom pain : NONE since I have been following her - Denies any abdominal pain, pt has numerous complaints that are hard to reconcile with exam, she has had a cholecystectomy. Lipase is normal.     E coli UTI - pansensitive  cont Rocephin   Morbid obesity + sleep apnea  HTN - controlled   Chronic normocytic anemia: monitor counts  - will obtain anemia panel, Hb slightly down, likely dilutional  History of pulmonary embolism, recurrent, 3 seperate episodes/ DVT, bil in  08/2011  Presence of IVC filter, placed June 2011 after 3d PE  on Coumadin, VQ performed was normal  -Coumadin and Lovenox per pharmacy  DVT Prophylaxis:  Code Status:  Disposition: Yesterday patient requested me for short-term rehabilitation as she felt she will not be able to manage at home. Today she tells me that she will not go to any STR and would like to go home. OOB today and management as above, DC in AM with home health.   Subjective: Complaining of coughing, "I'm still the same", however when I showed her the magazine with her name on it, she held it in both hands  Objective: Weight change: 1.247 kg (2 lb 12 oz)  Intake/Output Summary (Last 24 hours) at 05/09/12 0850 Last data filed at 05/09/12 0634  Gross per 24 hour  Intake 1843.8 ml  Output    900 ml  Net  943.8 ml   Blood pressure 130/52, pulse 68, temperature 98.9 F (37.2 C), temperature source Axillary, resp. rate 20, height 5\' 3"  (1.6 m), weight 123.9 kg (273 lb 2.4 oz), SpO2 91.00%.  Physical Exam: General: AXO X3, NAD, anxious. CVS: S1-S2 clear, no mrg Chest: CTA B.  Abdomen: NT, ND, NBS Extremities: no c/c/e bilaterally Neuro: shaky but moving all 4 extremities spontaneously   Lab Results: Basic Metabolic Panel:  Lab [id number] 0505 05/08/12 0545 05/03/12 1432  NA 124* 129* --  K 4.6 3.7 --  CL 86* 90* --  CO2 26 29 --  GLUCOSE 356* 75 --  BUN 76* 77* --  CREATININE 2.05* 2.09* --  CALCIUM 8.3* 8.7 --  MG -- -- 2.6*  PHOS -- -- --   Liver Function Tests:  Lab 05/03/12 0525  AST 17  ALT 12  ALKPHOS 74  BILITOT 0.6  PROT 7.6  ALBUMIN 3.4*    Lab 05/06/12 0610  LIPASE 15  AMYLASE --   No results found for this basename: AMMONIA:2 in the last 168 hours CBC:  Lab 05/09/12 0505 05/08/12 0545 05/03/12 0525  WBC 6.6 7.2 --  NEUTROABS -- -- 11.3*  HGB 7.8* 8.3* --  HCT 23.7* 26.6* --  MCV 85.6 87.5 --  PLT 154 170 --   Cardiac Enzymes:  Lab 05/04/12 0200 05/03/12 1957 05/03/12  1435  CKTOTAL -- -- --  CKMB -- -- --  CKMBINDEX -- -- --  TROPONINI <0.30 <0.30 <0.30   BNP: No components found with this basename: POCBNP:2 CBG:  Lab 05/09/12 0608 05/08/12 2056 05/08/12 1626 05/08/12 1107 05/08/12 0540  GLUCAP 346* 304* 261* 104* 143*     Micro Results: Recent Results (from the past 240 hour(s))  URINE CULTURE     Status: Normal   Collection Time   05/03/12 12:45 PM      Component Value Range Status Comment   Specimen Description URINE, CLEAN CATCH   Final    Special Requests NONE   Final    Culture  Setup Time 05/03/2012 13:33   Final    Colony Count >=100,000 COLONIES/ML   Final    Culture ESCHERICHIA COLI   Final    Report Status 05/05/2012 FINAL   Final    Organism ID, Bacteria ESCHERICHIA COLI   Final   MRSA PCR SCREENING     Status: Normal   Collection Time   05/03/12  1:35 PM      Component Value Range Status Comment   MRSA by PCR NEGATIVE  NEGATIVE Final     Studies/Results: Dg Chest 2 View  05/03/2012  *RADIOLOGY REPORT*  Clinical Data: Chest pain, shortness of breath.  CHEST - 2 VIEW  Comparison: 03/26/2012 CT  Findings: Heart size upper normal.  Pulmonary arterial prominence. Mild bibasilar opacities.  No definite pleural effusion or pneumothorax.  Limited osseous bowel evaluation without definite acute process  IMPRESSION: Mild lung base opacities; atelectasis (favored) versus infiltrate.  Pulmonary arterial hypertension.   Original Report Authenticated By: Carlos Levering, M.D.    Nm Pulmonary Perf And Vent  05/03/2012  *RADIOLOGY REPORT*  Clinical Data:  Shortness of breath  NM PULMONARY VENTILATION AND PERFUSION SCANS; ventilation study with re- breathing  Views:  Anterior and posterior:  Ventilation; anterior, posterior, right lateral, left lateral, RPO, LAO, LPO, RAO:  Perfusion  Radiopharmaceutical: Xenon 133 gas:  Ventilation; technetium 69m macroaggregated albumin:  Perfusion  Dose:  10.0 mCi:  Ventilation; 6.0 mCi:  Perfusion  Route of  administration:  Inhalation:  Ventilation; intravenous: Perfusion  Comparison:  Chest radiograph May 03, 2012  Findings: The ventilation study shows homogeneous and symmetric uptake of radiotracer with prompt washout bilaterally.  The perfusion study shows homogeneous and  symmetric uptake without appreciable perfusion defect.  There is no appreciable ventilation / perfusion mismatch.  IMPRESSION: Essentially normal ventilation and perfusion lung scans.  Very low probability of pulmonary embolus.   Original Report Authenticated By: Lowella Grip, M.D.     Medications: Scheduled Meds:    . ALPRAZolam  0.25 mg Oral BID  . aspirin EC  81 mg Oral Daily  . atorvastatin  10 mg Oral q1800  . cefTRIAXone (ROCEPHIN)  IV  1 g Intravenous Q24H  . citalopram  20 mg Oral QHS  . enoxaparin (LOVENOX) injection  120 mg Subcutaneous Q12H  . gabapentin  300 mg Oral BID  . insulin aspart  0-15 Units Subcutaneous TID WC  . insulin aspart  0-5 Units Subcutaneous QHS  . insulin aspart  3 Units Subcutaneous TID WC  . insulin glargine  20 Units Subcutaneous Daily  . isosorbide mononitrate  30 mg Oral Daily  . levalbuterol  0.63 mg Nebulization Q6H  . levothyroxine  25 mcg Oral Daily  . metoprolol  50 mg Oral BID  . multivitamin with minerals  1 tablet Oral Daily  . Muscle Rub   Topical TID  . omega-3 acid ethyl esters  1 g Oral Daily  . pantoprazole  40 mg Oral Q1200  . Warfarin - Pharmacist Dosing Inpatient   Does not apply q1800      LOS: 6 days   Sejla Marzano M.D. [facility] 05/09/2012, 8:50 AM Pager: [phone]  If 7PM-7AM, please contact night-coverage [email or web address] Password [email or web address]

## 2012-05-09 NOTE — Progress Notes (Signed)
Pt with DX of tremors encourage her to have someone feed her to avoid spill food.   Attempted to feed pt dinner declined Verbalized understanding.  Will  Continue to monitor.  Karie Kirks, Therapist, sports.

## 2012-05-10 DIAGNOSIS — E162 Hypoglycemia, unspecified: Secondary | ICD-10-CM

## 2012-05-10 LAB — BASIC METABOLIC PANEL
BUN: 78 mg/dL — ABNORMAL HIGH (ref 6–23)
CO2: 27 mEq/L (ref 19–32)
Calcium: 8.9 mg/dL (ref 8.4–10.5)
Calcium: 8.9 mg/dL (ref 8.4–10.5)
Chloride: 91 mEq/L — ABNORMAL LOW (ref 96–112)
Creatinine, Ser: 1.95 mg/dL — ABNORMAL HIGH (ref 0.50–1.10)
GFR calc Af Amer: 32 mL/min — ABNORMAL LOW (ref >90)
GFR calc non Af Amer: 28 mL/min — ABNORMAL LOW (ref >90)
Sodium: 129 mEq/L — ABNORMAL LOW (ref 135–145)

## 2012-05-10 LAB — CBC
HCT: 26.7 % — ABNORMAL LOW (ref 36.0–46.0)
MCH: 27.3 pg (ref 26.0–34.0)
MCHC: 32.6 g/dL (ref 30.0–36.0)
MCHC: 32.1 g/dL (ref 30.0–36.0)
MCV: 85.3 fL (ref 78.0–100.0)
Platelets: 147 10*3/uL — ABNORMAL LOW (ref 150–400)
Platelets: 155 10*3/uL (ref 150–400)
RDW: 15.9 % — ABNORMAL HIGH (ref 11.5–15.5)
RDW: 15.9 % — ABNORMAL HIGH (ref 11.5–15.5)

## 2012-05-10 LAB — OCCULT BLOOD X 1 CARD TO LAB, STOOL: Fecal Occult Bld: NEGATIVE

## 2012-05-10 LAB — TYPE AND SCREEN
ABO/RH(D): A POS
Antibody Screen: NEGATIVE

## 2012-05-10 LAB — GLUCOSE, CAPILLARY
Glucose-Capillary: 192 mg/dL — ABNORMAL HIGH (ref 70–99)
Glucose-Capillary: 197 mg/dL — ABNORMAL HIGH (ref 70–99)

## 2012-05-10 LAB — PROTIME-INR
INR: 2.98 — ABNORMAL HIGH (ref 0.00–1.49)
Prothrombin Time: 29.4 seconds — ABNORMAL HIGH (ref 11.6–15.2)

## 2012-05-10 LAB — SODIUM, URINE, RANDOM: Sodium, Ur: <10 mEq/L

## 2012-05-10 MED ORDER — FERROUS GLUCONATE 324 (38 FE) MG PO TABS
324.0000 mg | ORAL_TABLET | Freq: Two times a day (BID) | ORAL | Status: DC
  Administered 2012-05-10 – 2012-05-11 (×2): 324 mg via ORAL
  Filled 2012-05-10 (×4): qty 1

## 2012-05-10 MED ORDER — WARFARIN SODIUM 5 MG PO TABS
5.0000 mg | ORAL_TABLET | Freq: Once | ORAL | Status: AC
  Administered 2012-05-10: 5 mg via ORAL
  Filled 2012-05-10: qty 1

## 2012-05-10 MED ORDER — LEVALBUTEROL HCL 0.63 MG/3ML IN NEBU
0.6300 mg | INHALATION_SOLUTION | Freq: Four times a day (QID) | RESPIRATORY_TRACT | Status: DC | PRN
  Filled 2012-05-10: qty 3

## 2012-05-10 MED ORDER — LEVALBUTEROL HCL 0.63 MG/3ML IN NEBU
0.6300 mg | INHALATION_SOLUTION | Freq: Two times a day (BID) | RESPIRATORY_TRACT | Status: DC
  Administered 2012-05-10 – 2012-05-11 (×3): 0.63 mg via RESPIRATORY_TRACT
  Filled 2012-05-10 (×5): qty 3

## 2012-05-10 NOTE — Progress Notes (Signed)
Please refer to prior physical medicine rehabilitation progress note 05/06/2012 as patient was minimal assistance at that time with recommendations for home health therapies. Workup thus far has been unremarkable for CVA. Patient requesting discharge to home. Will discuss with case manager

## 2012-05-10 NOTE — Progress Notes (Signed)
Clinical Social Work Progress Note PSYCHIATRY SERVICE LINE 05/10/2012  Patient:  [name]  Account:  [id number]  [place] Date:  05/03/2012  Clinical Social Worker:  Sindy Messing, LCSW  Date/Time:  05/10/2012 10:30 AM  Review of Patient  Overall Medical Condition:   Patient to dc to SNF when medically ready.   Participation Level:  Active  Participation Quality  Appropriate   Other Participation Quality:   Affect  Anxious   Cognitive  Appropriate   Reaction to Medications/Concerns:   None reported   Modes of Intervention  Support   Summary of Progress/Plan at Discharge   CSW met with patient at bedside. No visitors present.    Patient reports she is feeling weak today. Patient plans to go to SNF at dc. Patient reports that pastor has visited this weekend and her church sent her flowers. Patient reports that she is still feeling depressed and lonely. Patient is hopeful that if she will complete rehab and be able to more active in the community. CSW and patient discussed outpatient follow up at dc. Patient does not have any community resources.    CSW placed information for outpatient follow up and patient agreeable to schedule an appointment after she dc from SNF. CSW is signing off but available if needed.

## 2012-05-10 NOTE — Progress Notes (Signed)
Physical Therapy Treatment Patient Details Name: [name] MRN: [id number] DOB: [birth year] Today's Date: 05/10/2012 Time: [id number] PT Time Calculation (min): 11 min  PT Assessment / Plan / Recommendation Comments on Treatment Session  Pt ambulated 6' with RW, distance limited by 10/10 B forearm pain, pt sobbing bc of pain. RN notified.     Follow Up Recommendations  SNF;Supervision/Assistance - 24 hour     Does the patient have the potential to tolerate intense rehabilitation     Barriers to Discharge        Equipment Recommendations       Recommendations for Other Services    Frequency Min 3X/week   Plan Discharge plan remains appropriate;Frequency remains appropriate    Precautions / Restrictions Precautions Precautions: Fall   Pertinent Vitals/Pain *10/10 B forearms with walking with RW RN notified**    Mobility  Transfers Transfers: Sit to Stand;Stand to Sit Sit to Stand: 1: +2 Total assist;With armrests;With upper extremity assist;From chair/3-in-1 Sit to Stand: Patient Percentage: 50% Stand to Sit: 1: +2 Total assist;To chair/3-in-1 Stand to Sit: Patient Percentage: 70% Ambulation/Gait Ambulation/Gait Assistance: 1: +2 Total assist Ambulation/Gait: Patient Percentage: 70% Ambulation Distance (Feet): 6 Feet Assistive device: Rolling walker Gait Pattern: Step-to pattern;Decreased step length - right;Decreased step length - left General Gait Details: distance limited by B forearm pain, pt began crying due to pain, RN Notified -pain meds requested, +2 for safety/balance due to tremors    Exercises     PT Diagnosis:    PT Problem List:   PT Treatment Interventions:     PT Goals Acute Rehab PT Goals PT Goal Formulation: With patient Time For Goal Achievement: 05/20/12 Potential to Achieve Goals: Good Pt will go Supine/Side to Sit: with modified independence;with HOB 0 degrees Pt will go Sit to Supine/Side: with modified independence;with HOB 0  degrees Pt will go Sit to Stand: with modified independence PT Goal: Sit to Stand - Progress: Not progressing Pt will go Stand to Sit: with modified independence PT Goal: Stand to Sit - Progress: Not progressing Pt will Ambulate: >150 feet;with least restrictive assistive device;with modified independence PT Goal: Ambulate - Progress: Not progressing  Visit Information  Last PT Received On: 05/10/12 Assistance Needed: +2 Reason Eval/Treat Not Completed: Pain limiting ability to participate    Subjective Data  Subjective: "I can't take it (the pain) anymore". -pt sobbing Patient Stated Goal: decrease pain   Cognition  Cognition Overall Cognitive Status: Appears within functional limits for tasks assessed/performed Arousal/Alertness: Awake/alert Orientation Level: Appears intact for tasks assessed Behavior During Session: [facility] for tasks performed Current Attention Level: Focused    Balance     End of Session PT - End of Session Equipment Utilized During Treatment: Gait belt Activity Tolerance: Patient limited by pain Patient left: in chair;with call bell/phone within reach Nurse Communication: Mobility status   GP     [name] 05/10/2012, 1:48 PM [phone]

## 2012-05-10 NOTE — Progress Notes (Signed)
ANTICOAGULATION CONSULT NOTE - Follow Up Consult  Pharmacy Consult for Warfarin Indication: H/O of multiple VTE  Allergies  Allergen Reactions  . Morphine And Related Rash    Patient Measurements: Height: 5\' 3"  (160 cm) Weight: 273 lb 2.4 oz (123.9 kg) IBW/kg (Calculated) : 52.4   Vital Signs: Temp: 97.7 F (36.5 C) (02/03 0600) Temp src: Oral (02/03 0600) BP: 147/73 mmHg (02/03 0600) Pulse Rate: 64  (02/03 0600)  Labs:  Basename 05/10/12 0715 05/09/12 1840 05/09/12 0505 05/08/12 0545  HGB 8.7* 8.8* -- --  HCT 26.7* 26.3* 23.7* --  PLT 147* -- 154 170  APTT -- -- -- --  LABPROT 29.4* -- 30.9* 25.8*  INR 2.98* -- 3.19* 2.50*  HEPARINUNFRC -- -- -- --  CREATININE 1.95* -- 2.05* 2.09*  CKTOTAL -- -- -- --  CKMB -- -- -- --  TROPONINI -- -- -- --    Estimated Creatinine Clearance: 41.2 ml/min (by C-G formula based on Cr of 1.95).  Assessment: 57 y/o F with h/o multiple VTE (PE and DVT) who is on Warfarin per Pharmacy. INR today therapeutic today - 2.98 after dose held yesterday. CBC low but stable, no bleeding noted.    PTA Warfarin Dose: 5mg  on Tues/Thurs/Sat and 7.5mg  all other days.   Goal of Therapy:  INR 2-3 Monitor platelets by anticoagulation protocol: Yes   Plan:  - Coumadin 5mg  po today - Daily PT/INR - Monitor for bleeding  Thank you for the consult,   Sherlon Handing, PharmD, BCPS Clinical pharmacist, pager [phone] 05/10/2012 9:02 AM

## 2012-05-10 NOTE — Progress Notes (Signed)
Ace wraps applied to bilateral lower extremities. Shloimy Michalski, Joselyn Glassman, RN

## 2012-05-10 NOTE — Progress Notes (Signed)
Occupational Therapy Treatment Patient Details Name: [name] MRN: [id number] DOB: [birth year] Today's Date: 05/10/2012 Time: [id number] OT Time Calculation (min): 32 min  OT Assessment / Plan / Recommendation Comments on Treatment Session This 57 yo making progress since last session, but slowly due to pain and Bil UE ataxia (both which are new on this admission per pt). Will benefit from inpatient rehab or SNF     Follow Up Recommendations  CIR (if not then SNF, pt cannot take care of herself at this time)       Equipment Recommendations  3 in 1 bedside comode       Frequency Min 2X/week   Plan Discharge plan needs to be updated    Precautions / Restrictions Precautions Precautions: Fall   Pertinent Vitals/Pain Pain with activity Bil elbows and left wrist (pt reports she does not want pain meds right now)    ADL  Grooming: Simulated;Brushing hair;Minimal assistance Where Assessed - Grooming: Unsupported sitting Lower Body Dressing: +1 Total assistance (for shoes, was able to maintain sitting balance without A) Where Assessed - Lower Body Dressing: Unsupported sitting Toilet Transfer: Simulated;+2 Total assistance Toilet Transfer: Patient Percentage: 60% Toilet Transfer Method: Sit to stand Toilet Transfer Equipment:  (Bed, 6 steps forward and then sat in chair behind her) Equipment Used: Rolling walker;Gait belt Transfers/Ambulation Related to ADLs: Min A sit to stand with rocking motion from bed (lowest height), Mod A stand to sit with decreased control for descent; total A +2 (pt=70%) ambulation 6 steps in room ADL Comments: Bil UEs remain ataxic/tremorous causing pt to spill food items on herself when she is eating meals.       OT Goals ADL Goals ADL Goal: Grooming - Progress: Progressing toward goals ADL Goal: Toilet Transfer - Progress: Progressing toward goals  Visit Information  Last OT Received On: 05/10/12 Assistance Needed: +2    Subjective Data  Subjective: I don't want pain medicine right now (even though she says pain in left wrist and bil elbows is excruciating).      Cognition  Cognition Overall Cognitive Status: Appears within functional limits for tasks assessed/performed Arousal/Alertness: Awake/alert Orientation Level: Appears intact for tasks assessed Behavior During Session: [facility] for tasks performed Current Attention Level: Focused Cognition - Other Comments: ? psych component to issues    Mobility  Bed Mobility Bed Mobility: Supine to Sit;Sitting - Scoot to Edge of Bed Supine to Sit: 1: +2 Total assist Supine to Sit: Patient Percentage: 50% Sitting - Scoot to Edge of Bed: 4: Min assist Transfers Transfers: Sit to Stand;Stand to Sit Sit to Stand: 4: Min assist;With upper extremity assist;From bed (rocking motion from lowest height bed) Sit to Stand: Patient Percentage: 50% Stand to Sit: 3: Mod assist;With upper extremity assist;With armrests;To chair/3-in-1 (decreased control for descent) Stand to Sit: Patient Percentage: 70% Details for Transfer Assistance: Pt needs VCs for safe hand placement       Balance Static Sitting Balance Static Sitting - Balance Support: No upper extremity supported;Feet supported Static Sitting - Level of Assistance:  (min guard A) Static Sitting - Comment/# of Minutes: 5 minutes   End of Session OT - End of Session Equipment Utilized During Treatment: Gait belt (Rw) Activity Tolerance: Patient limited by fatigue;Patient limited by pain Patient left: in chair;with call bell/phone within reach (PT in room with her) Nurse Communication:  (Need to have rehab re-look at her)       Almon Register [id number] 05/10/2012, 3:22 PM

## 2012-05-10 NOTE — Progress Notes (Signed)
NEURO HOSPITALIST PROGRESS NOTE   SUBJECTIVE:                                                                                                                         Patient continues to feel frustrated that she has no answer for all her ailments. States she cannot move her arms or legs. While stating this she is picking a cup off the bedside table and drinking without difficulty.  OBJECTIVE:                                                                                                                           Vital signs in last 24 hours: Temp:  [97.2 F (36.2 C)-100.9 F (38.3 C)] 97.7 F (36.5 C) (02/03 0600) Pulse Rate:  [54-72] 60  (02/03 0918) Resp:  [18-20] 20  (02/03 0600) BP: (108-147)/(35-73) 121/35 mmHg (02/03 0918) SpO2:  [94 %-100 %] 96 % (02/03 0600)  Intake/Output from previous day: 02/02 0701 - 02/03 0700 In: 842.5 [P.O.:720; I.V.:10; Blood:12.5; IV Piggyback:100] Out: 1200 [Urine:1200] Intake/Output this shift: Total I/O In: 240 [P.O.:240] Out: -  Nutritional status: Carb Control  Past Medical History  Diagnosis Date  . CHF (congestive heart failure)   . Diabetes mellitus   . Arthritis   . Hypertension   . Depression   . DVT (deep venous thrombosis) 10 years ago  . PE (pulmonary thromboembolism) 3 years ag0  . Renal disorder     kindey function low  . A-fib   . Sleep apnea, has not started titration  05/03/2012     Neurologic Exam:   Mental Status: Alert, oriented, thought content appropriate.  Speech fluent without evidence of aphasia.  Able to follow 3 step commands without difficulty. Cranial Nerves: II:  Visual fields grossly normal, pupils equal, round, reactive to light and accommodation III,IV, VI: ptosis not present, extra-ocular motions intact bilaterally V,VII: smile symmetric, facial light touch sensation normal bilaterally VIII: hearing normal bilaterally IX,X: gag reflex present XI: bilateral  shoulder shrug XII: midline tongue extension Motor: (over all deconditioned) Right : Upper extremity   4/5    Left:     Upper extremity   4/5  Lower extremity   4/5  Lower extremity   4/5 --significant give way strength with bilateral triceps extension and all LE modalities of resistance.  --After the exam she then tells me she has a tremor--she again picks the cup up off the table and started to violently shake the cup but when I placed my hand on her arm she ceased the movement.   Tone and bulk:normal tone throughout; no atrophy noted Sensory: Pinprick and light touch intact throughout, bilaterally Deep Tendon Reflexes: 1+ bilateral UE, 1+ bilateral KJ and no AJ Plantars: mute Cerebellar: normal finger-to-nose,   CV: pulses palpable throughout    Lab Results: Lab Results  Component Value Date/Time   CHOL 120 05/04/2012  2:00 AM   Lipid Panel No results found for this basename: CHOL,TRIG,HDL,CHOLHDL,VLDL,LDLCALC in the last 72 hours  Studies/Results: Dg Chest 2 View  05/09/2012  *RADIOLOGY REPORT*  Clinical Data: Cough and chest pain.  CHEST - 2 VIEW  Comparison: 05/03/2012  Findings: Lung volumes are relatively low.  There is mild left basilar atelectasis.  The lungs are otherwise clear.  The heart, mediastinum and hila are unremarkable.  IMPRESSION: No acute cardiopulmonary disease.  No change from the recent prior study.   Original Report Authenticated By: Lajean Manes, M.D.     MEDICATIONS                                                                                                                        Scheduled:   . ALPRAZolam  0.25 mg Oral BID  . aspirin EC  81 mg Oral Daily  . atorvastatin  10 mg Oral q1800  . cefTRIAXone (ROCEPHIN)  IV  1 g Intravenous Q24H  . citalopram  20 mg Oral QHS  . ferrous gluconate  324 mg Oral BID WC  . gabapentin  300 mg Oral BID  . insulin aspart  0-15 Units Subcutaneous TID WC  . insulin aspart  0-5 Units Subcutaneous QHS  .  insulin aspart  3 Units Subcutaneous TID WC  . insulin glargine  20 Units Subcutaneous Daily  . isosorbide mononitrate  30 mg Oral Daily  . levalbuterol  0.63 mg Nebulization BID  . levothyroxine  25 mcg Oral Daily  . metoprolol  50 mg Oral BID  . multivitamin with minerals  1 tablet Oral Daily  . Muscle Rub   Topical TID  . omega-3 acid ethyl esters  1 g Oral Daily  . pantoprazole  40 mg Oral Q1200  . warfarin  5 mg Oral ONCE-1800  . Warfarin - Pharmacist Dosing Inpatient   Does not apply q1800    ASSESSMENT/PLAN:  Patient Active Hospital Problem List: Tremors  (05/07/2012)   Assessment: No localizing or lateralizing symptoms on exam.  No tremor noted on exam other than functional jerking of arms which are completely suppressed when I touch her arm. As noted prior--MRI brain normal and EEG showing no abnormalities.    Recommend: 1) Agree with outpatient or inpatient rehab for conditioning.    No further recommendations.  S/O    Assessment and plan discussed with with attending physician and they are in agreement.    Etta Quill PA-C [facility] [phone]  05/10/2012, 10:09 AM

## 2012-05-10 NOTE — Progress Notes (Signed)
Patient ID: [name]  female  [id number]    DOB: [birth year]    DOA: 05/03/2012  PCP: [name], MD  Assessment/Plan:  Tremors with shakiness- appears somatic/functional -  MRI of the brain and EEG done and both are unremarkable, no cerebellar CVA or any seizure activity respectively, she is on aspirin will continue - Neurology consulted on 1/31 and per their evaluation, neurology exam is nonfocal with many features to suggest functionality. I called neurology to reevaluate her again today as patient continues to complain of weakness in her arms and legs and tremors. Again upon reevaluation, neurology felt that her symptoms are functional and likely due to deconditioning. - Psychiatry consultation obtained also on 1/31, also feel patient has somatic issues and can make decisions.   - Continue Xanax for now  Hypoglycemia with insulin-dependent diabetes mellitus, with proteinuria; Resolved, now with hyper glycemia  -Continue Lantus, NovoLog with meals and sliding scale  Hyponatremia: Corrected sodium with glucose of 356 is 128, likely down due to dilutional effect and possibly some SIADH: Improving - Chest x-ray clear, no pneumonia or emphysema  Anemia: Likely dilutional effect, hemoglobin 7.8, status post 1 unit packed RBCs,  - stool occult test negative - Started on iron replacement, workup out-patient  Acute kidney injury on Chronic renal insufficiency, stage IV (severe) - due to DM & HTN : Likely secondary to Lasix, metolazone and hypoglycemia, improving and close to her baseline   Chest pain, rule out cardiac, negative myoview in the past, LBBB, PHTN  W/u completed per Cardiology   Epigastric/abdom pain : NONE since I have been following her - Denies any abdominal pain, pt has numerous complaints that are hard to reconcile with exam, she has had a cholecystectomy. Lipase is normal.     E coli UTI - pansensitive  - On Rocephin, dc'ed, completed course of rocephin   Morbid  obesity + sleep apnea  HTN - controlled   Chronic normocytic anemia: monitor counts  - will obtain anemia panel, Hb slightly down, likely dilutional  History of pulmonary embolism, recurrent, 3 seperate episodes/ DVT, bil in 08/2011  Presence of IVC filter, placed June 2011 after 3d PE  on Coumadin, VQ performed was normal  -Coumadin and Lovenox per pharmacy  DVT Prophylaxis:  Code Status:  Disposition: The patient continues to change her mind every day, today she is requesting for short-term rehabilitation. Will attempt PT evaluation again for disposition. Social worker updated, CIR has declined her. Disposition is challenging, patient needs to be mobilized, up in chair.  Subjective: Continues to complain of weakness, and shaking. No chest pain, abdominal pain, breakfast plate was empty at the time of my encounter.   Objective: Weight change:   Intake/Output Summary (Last 24 hours) at 05/10/12 1043 Last data filed at 05/10/12 0800  Gross per 24 hour  Intake  962.5 ml  Output   1200 ml  Net -237.5 ml   Blood pressure 121/35, pulse 60, temperature 97.7 F (36.5 C), temperature source Oral, resp. rate 20, height 5\' 3"  (1.6 m), weight 123.9 kg (273 lb 2.4 oz), SpO2 96.00%.  Physical Exam: General: AXO X3, NAD, anxious. CVS: S1-S2 clear, no mrg Chest: CTA B.  Abdomen: NT, ND, NBS Extremities: no c/c/e bilaterally Neuro: moving all 4 extremities spontaneously in bed  Lab Results: Basic Metabolic Panel:  Lab Q000111Q 0715 05/09/12 0505 05/03/12 1432  NA 129* 124* --  K 4.3 4.6 --  CL 91* 86* --  CO2 27 26 --  GLUCOSE 205* 356* --  BUN 78* 76* --  CREATININE 1.95* 2.05* --  CALCIUM 8.9 8.3* --  MG -- -- 2.6*  PHOS -- -- --   Lab 05/06/12 0610  LIPASE 15  AMYLASE --   CBC:  Lab 05/10/12 0715 05/09/12 1840 05/09/12 0505  WBC 5.9 -- 6.6  NEUTROABS -- -- --  HGB 8.7* 8.8* --  HCT 26.7* 26.3* --  MCV 85.3 -- 85.6  PLT 147* -- 154   Cardiac Enzymes:  Lab  05/04/12 0200 05/03/12 1957 05/03/12 1435  CKTOTAL -- -- --  CKMB -- -- --  CKMBINDEX -- -- --  TROPONINI <0.30 <0.30 <0.30   BNP: No components found with this basename: POCBNP:2 CBG:  Lab 05/10/12 0624 05/09/12 2059 05/09/12 1617 05/09/12 1152 05/09/12 0608  GLUCAP 196* 194* 224* 303* 346*     Micro Results: Recent Results (from the past 240 hour(s))  URINE CULTURE     Status: Normal   Collection Time   05/03/12 12:45 PM      Component Value Range Status Comment   Specimen Description URINE, CLEAN CATCH   Final    Special Requests NONE   Final    Culture  Setup Time 05/03/2012 13:33   Final    Colony Count >=100,000 COLONIES/ML   Final    Culture ESCHERICHIA COLI   Final    Report Status 05/05/2012 FINAL   Final    Organism ID, Bacteria ESCHERICHIA COLI   Final   MRSA PCR SCREENING     Status: Normal   Collection Time   05/03/12  1:35 PM      Component Value Range Status Comment   MRSA by PCR NEGATIVE  NEGATIVE Final     Studies/Results: Dg Chest 2 View  05/03/2012  *RADIOLOGY REPORT*  Clinical Data: Chest pain, shortness of breath.  CHEST - 2 VIEW  Comparison: 03/26/2012 CT  Findings: Heart size upper normal.  Pulmonary arterial prominence. Mild bibasilar opacities.  No definite pleural effusion or pneumothorax.  Limited osseous bowel evaluation without definite acute process  IMPRESSION: Mild lung base opacities; atelectasis (favored) versus infiltrate.  Pulmonary arterial hypertension.   Original Report Authenticated By: Carlos Levering, M.D.    Nm Pulmonary Perf And Vent  05/03/2012  *RADIOLOGY REPORT*  Clinical Data:  Shortness of breath  NM PULMONARY VENTILATION AND PERFUSION SCANS; ventilation study with re- breathing  Views:  Anterior and posterior:  Ventilation; anterior, posterior, right lateral, left lateral, RPO, LAO, LPO, RAO:  Perfusion  Radiopharmaceutical: Xenon 133 gas:  Ventilation; technetium 13m macroaggregated albumin:  Perfusion  Dose:  10.0 mCi:   Ventilation; 6.0 mCi:  Perfusion  Route of administration:  Inhalation:  Ventilation; intravenous: Perfusion  Comparison:  Chest radiograph May 03, 2012  Findings: The ventilation study shows homogeneous and symmetric uptake of radiotracer with prompt washout bilaterally.  The perfusion study shows homogeneous and  symmetric uptake without appreciable perfusion defect.  There is no appreciable ventilation / perfusion mismatch.  IMPRESSION: Essentially normal ventilation and perfusion lung scans.  Very low probability of pulmonary embolus.   Original Report Authenticated By: Lowella Grip, M.D.     Medications: Scheduled Meds:    . ALPRAZolam  0.25 mg Oral BID  . aspirin EC  81 mg Oral Daily  . atorvastatin  10 mg Oral q1800  . citalopram  20 mg Oral QHS  . ferrous gluconate  324 mg Oral BID WC  . gabapentin  300 mg Oral BID  .  insulin aspart  0-15 Units Subcutaneous TID WC  . insulin aspart  0-5 Units Subcutaneous QHS  . insulin aspart  3 Units Subcutaneous TID WC  . insulin glargine  20 Units Subcutaneous Daily  . isosorbide mononitrate  30 mg Oral Daily  . levalbuterol  0.63 mg Nebulization BID  . levothyroxine  25 mcg Oral Daily  . metoprolol  50 mg Oral BID  . multivitamin with minerals  1 tablet Oral Daily  . Muscle Rub   Topical TID  . omega-3 acid ethyl esters  1 g Oral Daily  . pantoprazole  40 mg Oral Q1200  . warfarin  5 mg Oral ONCE-1800  . Warfarin - Pharmacist Dosing Inpatient   Does not apply q1800      LOS: 7 days   RAI,RIPUDEEP M.D. [facility] 05/10/2012, 10:43 AM Pager: [phone]  If 7PM-7AM, please contact night-coverage [email or web address] Password [email or web address]

## 2012-05-11 LAB — GLUCOSE, CAPILLARY
Glucose-Capillary: 164 mg/dL — ABNORMAL HIGH (ref 70–99)
Glucose-Capillary: 214 mg/dL — ABNORMAL HIGH (ref 70–99)

## 2012-05-11 LAB — PROTIME-INR
INR: 2.58 — ABNORMAL HIGH (ref 0.00–1.49)
Prothrombin Time: 26.4 seconds — ABNORMAL HIGH (ref 11.6–15.2)

## 2012-05-11 MED ORDER — INSULIN GLARGINE 100 UNIT/ML ~~LOC~~ SOLN
25.0000 [IU] | Freq: Every day | SUBCUTANEOUS | Status: DC
  Administered 2012-05-11: 25 [IU] via SUBCUTANEOUS

## 2012-05-11 MED ORDER — INSULIN ASPART 100 UNIT/ML ~~LOC~~ SOLN
5.0000 [IU] | Freq: Three times a day (TID) | SUBCUTANEOUS | Status: DC
  Administered 2012-05-11: 5 [IU] via SUBCUTANEOUS

## 2012-05-11 MED ORDER — WARFARIN SODIUM 7.5 MG PO TABS: 7.5000 mg | ORAL_TABLET | ORAL | Status: DC

## 2012-05-11 MED ORDER — DIAZEPAM 5 MG PO TABS: 5.0000 mg | ORAL_TABLET | Freq: Every evening | ORAL | Status: DC | PRN

## 2012-05-11 MED ORDER — MUSCLE RUB 10-15 % EX CREA: 1.0000 "application " | TOPICAL_CREAM | Freq: Three times a day (TID) | CUTANEOUS | Status: DC

## 2012-05-11 MED ORDER — ALPRAZOLAM 0.25 MG PO TABS: 0.2500 mg | ORAL_TABLET | Freq: Two times a day (BID) | ORAL | Status: DC

## 2012-05-11 MED ORDER — HYDROCODONE-ACETAMINOPHEN 5-500 MG PO TABS: 1.0000 | ORAL_TABLET | Freq: Three times a day (TID) | ORAL | Status: DC | PRN

## 2012-05-11 MED ORDER — FUROSEMIDE 80 MG PO TABS: 40.0000 mg | ORAL_TABLET | Freq: Every day | ORAL | Status: DC

## 2012-05-11 MED ORDER — ISOSORBIDE MONONITRATE ER 30 MG PO TB24: 30.0000 mg | ORAL_TABLET | Freq: Every day | ORAL | Status: DC

## 2012-05-11 MED ORDER — ACETAMINOPHEN 325 MG PO TABS
650.0000 mg | ORAL_TABLET | ORAL | Status: DC | PRN
  Administered 2012-05-11: 650 mg via ORAL

## 2012-05-11 MED ORDER — GABAPENTIN 300 MG PO CAPS: 300.0000 mg | ORAL_CAPSULE | Freq: Two times a day (BID) | ORAL | Status: DC

## 2012-05-11 MED ORDER — WARFARIN SODIUM 5 MG PO TABS
5.0000 mg | ORAL_TABLET | ORAL | Status: DC
  Filled 2012-05-11: qty 1

## 2012-05-11 MED ORDER — FERROUS GLUCONATE 324 (38 FE) MG PO TABS: 324.0000 mg | ORAL_TABLET | Freq: Two times a day (BID) | ORAL | Status: DC

## 2012-05-11 NOTE — Progress Notes (Signed)
ANTICOAGULATION CONSULT NOTE - Follow Up Consult  Pharmacy Consult for Warfarin Indication: H/O of multiple VTE  Allergies  Allergen Reactions  . Morphine And Related Rash    Patient Measurements: Height: 5\' 3"  (160 cm) Weight: 268 lb 1.3 oz (121.6 kg) IBW/kg (Calculated) : 52.4   Vital Signs: Temp: 99.4 F (37.4 C) (02/04 0442) Temp src: Oral (02/04 0442) BP: 109/50 mmHg (02/04 0442) Pulse Rate: 51  (02/04 0442)  Labs:  Basename 05/11/12 0620 05/10/12 1157 05/10/12 0715 05/09/12 1840 05/09/12 0505  HGB -- 8.6* 8.7* -- --  HCT -- 26.8* 26.7* 26.3* --  PLT -- 155 147* -- 154  APTT -- -- -- -- --  LABPROT 26.4* -- 29.4* -- 30.9*  INR 2.58* -- 2.98* -- 3.19*  HEPARINUNFRC -- -- -- -- --  CREATININE -- 1.91* 1.95* -- 2.05*  CKTOTAL -- -- -- -- --  CKMB -- -- -- -- --  TROPONINI -- -- -- -- --    Estimated Creatinine Clearance: 41.6 ml/min (by C-G formula based on Cr of 1.91).  Assessment: 57 y/o F with h/o multiple VTE (PE and DVT) who is on Warfarin per Pharmacy. INR today therapeutic today - 2.58. CBC low but stable, no bleeding noted. Noted plan to send pt to SNF today.  PTA Warfarin Dose: 5mg  on Tues/Thurs/Sat and 7.5mg  all other days.   Goal of Therapy:  INR 2-3 Monitor platelets by anticoagulation protocol: Yes   Plan:  - Restart home coumadin dose 5mg  on Tues/Thur/Sat and 7.5mg  all other days - Daily PT/INR - Monitor for bleeding  Thank you for the consult,   Sherlon Handing, PharmD, BCPS Clinical pharmacist, pager [phone] 05/11/2012 9:40 AM

## 2012-05-11 NOTE — Progress Notes (Signed)
Bed search of [facility] determined only 2 possible choices for placement. Discussed with patient and she adamantly refused.  Per her request, CSW extended bed search into [facility] today as her sister lives in [place].  Due to patient's status of "Medicaid only" coverage-- CSW was unable to obtain any further bed offers for patient.  She was encouraged to accept [facility] facility but she continues to refuse.  Patient stated "I know that because I only have Medicaid that I am treated like a second-class citizen".  Patient relates that she has a large family that will be helping her at home and they will come and pick her up today. She will also be moving into a 2-bedroom handicap accessible apartment later this month and she is very pleased with this. She states that she has all of her necessary DME at home except a BSC- notified [facility] of this as well as patient's nurse- Denny Peon. No further CSW needs identified.  Lorie Phenix. [place], [place]

## 2012-05-11 NOTE — Progress Notes (Signed)
Discussed discharge instructions with pt including follow up appts, how and when to call the dr, medications to take at home, diet, and activity. Pt verbalized understanding and denied any questions. Prescriptions given and pt acknowledged receipt. Pt waiting for family to arrive to transport home and BSC to be delivered to room. Will continue to monitor pt closely until she leaves.  Eulis Canner, RN

## 2012-05-11 NOTE — Discharge Summary (Signed)
Physician Discharge Summary  Patient ID: [name] MRN: [id number] DOB/AGE: 07/20/1955 57 y.o.  Admit date: 05/03/2012 Discharge date: 05/11/2012  Primary Care Physician:  [name], MD  Discharge Diagnoses:    . Chest pain, rule out cardiac, negative myoview in the past . HTN (hypertension) . HLD (hyperlipidemia) . Pulmonary HTN, Moderate  PA pressure 21mmHG 09/2009 . Diastolic dysfunction, grade 2, EF 65-70% May 2013 . LBBB (left bundle branch block) . Chronic renal insufficiency, stage IV (severe) - due to DM & HTN, SCr improving-1.6 . DVT, bil in 08/2011 . Morbid obesity, + sleep apnea . Anemia - due to GI bleed & chronic disease. Marland Kitchen UTI (urinary tract infection) completed course of antibiotics  . Abdominal pain- resolved no clear etiology  . Tremors most likely functional versus deconditioning . Hypoglycemia- likely due to oral hypoglycemics, resolved Uncontrolled diabetes mellitus on insulin  Consults:  Cardiology, [facility]                    Neurology, Dr. Aram Beecham                    Psychiatry, Dr. Evern Bio    Discharge Medications:   Medication List     As of 05/11/2012  9:59 AM    STOP taking these medications         glimepiride 2 MG tablet   Commonly known as: AMARYL      metolazone 2.5 MG tablet   Commonly known as: ZAROXOLYN      TAKE these medications         ALPRAZolam 0.25 MG tablet   Commonly known as: XANAX   Take 1 tablet (0.25 mg total) by mouth 2 (two) times daily.      aspirin EC 81 MG tablet   Take 81 mg by mouth daily.      citalopram 20 MG tablet   Commonly known as: CELEXA   Take 20 mg by mouth at bedtime.      diazepam 5 MG tablet   Commonly known as: VALIUM   Take 1 tablet (5 mg total) by mouth at bedtime as needed for anxiety or sleep (muscle spasms). For muscle spasms.      ferrous gluconate 324 MG tablet   Commonly known as: FERGON   Take 1 tablet (324 mg total) by mouth 2 (two) times daily with a meal.      Fish  Oil 1200 MG Caps   Take 1 capsule by mouth 2 (two) times daily.      furosemide 80 MG tablet   Commonly known as: LASIX   Take 0.5 tablets (40 mg total) by mouth daily.      gabapentin 300 MG capsule   Commonly known as: NEURONTIN   Take 1 capsule (300 mg total) by mouth 2 (two) times daily.      HYDROcodone-acetaminophen 5-500 MG per tablet   Commonly known as: VICODIN   Take 1 tablet by mouth every 8 (eight) hours as needed for pain. For pain.      insulin glargine 100 UNIT/ML injection   Commonly known as: LANTUS   Inject 23 Units into the skin See admin instructions. She uses a sliding scale.      insulin glulisine 100 UNIT/ML injection   Commonly known as: APIDRA   Inject 10 Units into the skin 3 (three) times daily before meals.      isosorbide mononitrate 30 MG 24 hr tablet   Commonly known as:  IMDUR   Take 1 tablet (30 mg total) by mouth daily.      levothyroxine 25 MCG tablet   Commonly known as: SYNTHROID, LEVOTHROID   Take 25 mcg by mouth daily.      metoprolol 50 MG tablet   Commonly known as: LOPRESSOR   Take 50 mg by mouth 2 (two) times daily.      multivitamin with minerals Tabs   Take 1 tablet by mouth daily.      Muscle Rub 10-15 % Crea   Apply 1 application topically 3 (three) times daily.      OVER THE COUNTER MEDICATION   Take 2 tablets by mouth daily. Cherry 1000mg  tablets.      pantoprazole 40 MG tablet   Commonly known as: PROTONIX   Take 1 tablet (40 mg total) by mouth daily at 12 noon.      warfarin 2.5 MG tablet   Commonly known as: COUMADIN   Take 5-7.5 mg by mouth daily. 5 mg on Tue,Thu,Sat  and then 7.5 mg on Sun, Mon,Wed,Fri         Brief H and P: For complete details please refer to admission H and P (the patient was admitted by [facility] cardiology for chest pain), but in brief 57 y.o. female with a history of pulmonary embolism, hypertension, hyperlipidemia, diabetes x20 years congestive heart failure-secondary to diastolic  dysfunciton and pul. hypertension, and known three-vessel calcification by CT and a negative stress test in May of 2013 presented with 2 days of shortness of breath that had changed somewhat over the course of the pain/SOB. Patient was to followup with her primary care physician on the day of admission however had onset of severe chest pressure during the middle the night. Chest pressure was an 8/10 on arrival and described as "an elephant on my chest", she said first nitroglycerin did not help via EMS, subsequent nitroglycerin did give her some relief. She did receive 324mg  of aspirin by EMS. She related the pain as pressure. Over the last 2-3 days it was associated with nausea on occ. Diaphoresis and SOB. Mid sternal chest pressure that radiated across chest today and down lt. Arm.  In ER neg VQ scan for pul. Embolus with elevated D dimer. Pt is on coumadin and also has a [place] filter.  She has CKD stage IV which has made proceeding with cardiac cath difficult. EKG did not show acute changes and with Dilaudid her pain had resolved. Coumadin followed by Dr. Mariana Kaufman office. Patient was admitted to step down unit for further workup.    Hospital Course:  57 year old woman with complex past medical history presented to emergency department with chest pain and shortness of breath. She was admitted by the Cardiology service. Urinalysis was noted to be abnormal and chest x-ray revealed atelectasis versus infiltrate. Hospitalist service was asked to consult on these 2 issues.  The patient reported several days of dysuria and frequency but no hesitancy. She also reported a minimal amount of cough for the last several days. She had some shortness of breath which she associated with chest pain.  Urinalysis was grossly positive. Chest x-ray revealed atelectasis versus infiltrate.  Cardiology has completed their w/u, and find no active cardiac issues at this time. The patient started complaining of severe abdom  pain which extends across her abdomen. Hospitalist service assumed care on 05/05/2012. On the floor, patient started complaining of significant tremors and weakness in her extremities. Patient has numerous complaints that are hard to  reconcile with exam.  Tremors with shakiness- appears somatic/functional  MRI of the brain and EEG done on 05/06/2012 were unremarkable, no cerebellar CVA or any seizure activity respectively. Patient was on aspirin hence was continued. Neurology was consulted on 1/31 and per their evaluation, neurology exam is nonfocal with many features to suggest functionality. Neurologic was called on 05/10/2012 again for evaluation as patient continued to complain of weakness in her arms and legs and tremors. Again upon reevaluation, neurology felt that her symptoms are functional and likely due to deconditioning. Psychiatry consultation obtained also on 1/31, also feel patient has somatic issues and can make decisions. Patient was placed on Xanax for her anxiety. Since yesterday patient has improved and feels that she would be able to manage with physical therapy to regain her strength.   Hypoglycemia with insulin-dependent diabetes mellitus, with proteinuria; Resolved, now with hyper glycemia. Hypoglycemia was likely scheduled to oral hypoglycemics and patient was not eating very well. She did receive D5 drip, her blood sugars has been now uncontrolled and patient was placed on Lantus, sliding scale with meals coverage.   Hyponatremia: Pseudohyponatremia secondary to elevated glucose and dilutional effect improving. Chest x-ray was clear with no pneumonia or emphysema   Anemia: Likely dilutional effect, hemoglobin 7.8, status post 1 unit packed RBCs, stool occult test negative. Started on iron replacement, workup out-patient.   Acute kidney injury on Chronic renal insufficiency, stage IV (severe) - due to DM & HTN : Likely secondary to Lasix, metolazone and hypoglycemia, improving and  close to her baseline   Chest pain, rule out cardiac, negative myoview in the past, LBBB, PHTN: Workup completed per Cardiology, troponin negative, cardiology felt that most likely this is noncardiac chest pain. She has an outpatient followup with cardiology scheduled, ultimately may need left heart cath to prove this when INR is less than 1.5.    Epigastric/abdom pain : NONE Since patient was transferred on the floor. The patient never complained of any abdominal pain after she was transferred from the step down unit. Lipase was normal and she already had a cholecystectomy done. Patient has been eating and drinking well with no nausea or vomiting.   E coli UTI - pansensitive; she has completed a course of Rocephin  Morbid obesity + sleep apnea   HTN - controlled   History of pulmonary embolism, recurrent, 3 seperate episodes/ DVT, bil in 08/2011  Presence of IVC filter, placed June 2011 after 3d PE  on Coumadin, VQ performed was normal, continue Coumadin outpatient   Day of Discharge BP 109/50  Pulse 51  Temp 99.4 F (37.4 C) (Oral)  Resp 18  Ht 5\' 3"  (1.6 m)  Wt 121.6 kg (268 lb 1.3 oz)  BMI 47.49 kg/m2  SpO2 96%  Physical Exam: General: Alert and awake oriented x3 not in any acute distress. HEENT: anicteric sclera, pupils reactive to light and accommodation CVS: S1-S2 clear no murmur rubs or gallops Chest: clear to auscultation bilaterally, no wheezing rales or rhonchi Abdomen: obese soft nontender, nondistended, normal bowel sounds, no organomegaly Extremities: no cyanosis, clubbing or edema noted bilaterally Neuro: Cranial nerves II-XII intact, no focal neurological deficits   The results of significant diagnostics from this hospitalization (including imaging, microbiology, ancillary and laboratory) are listed below for reference.    LAB RESULTS: Basic Metabolic Panel:  Lab Q000111Q 1157 05/10/12 0715  NA 129* 129*  K 4.3 4.3  CL 91* 91*  CO2 26 27  GLUCOSE 204*  205*  BUN 79*  78*  CREATININE 1.91* 1.95*  CALCIUM 8.9 8.9  MG -- --  PHOS -- --    Lab 05/06/12 0610  LIPASE 15  AMYLASE --   No results found for this basename: AMMONIA:2 in the last 168 hours CBC:  Lab 05/10/12 1157 05/10/12 0715  WBC 6.0 5.9  NEUTROABS -- --  HGB 8.6* 8.7*  HCT 26.8* 26.7*  MCV 85.1 --  PLT 155 147*   Cardiac Enzymes: No results found for this basename: CKTOTAL:2,CKMB:2,CKMBINDEX:2,TROPONINI:2 in the last 168 hours BNP: No components found with this basename: POCBNP:2 CBG:  Lab 05/10/12 2128 05/10/12 1625  GLUCAP 205* 197*    Significant Diagnostic Studies:  Dg Chest 2 View  05/03/2012  *RADIOLOGY REPORT*  Clinical Data: Chest pain, shortness of breath.  CHEST - 2 VIEW  Comparison: 03/26/2012 CT  Findings: Heart size upper normal.  Pulmonary arterial prominence. Mild bibasilar opacities.  No definite pleural effusion or pneumothorax.  Limited osseous bowel evaluation without definite acute process  IMPRESSION: Mild lung base opacities; atelectasis (favored) versus infiltrate.  Pulmonary arterial hypertension.   Original Report Authenticated By: Carlos Levering, M.D.    Nm Pulmonary Perf And Vent  05/03/2012  *RADIOLOGY REPORT*  Clinical Data:  Shortness of breath  NM PULMONARY VENTILATION AND PERFUSION SCANS; ventilation study with re- breathing  Views:  Anterior and posterior:  Ventilation; anterior, posterior, right lateral, left lateral, RPO, LAO, LPO, RAO:  Perfusion  Radiopharmaceutical: Xenon 133 gas:  Ventilation; technetium 22m macroaggregated albumin:  Perfusion  Dose:  10.0 mCi:  Ventilation; 6.0 mCi:  Perfusion  Route of administration:  Inhalation:  Ventilation; intravenous: Perfusion  Comparison:  Chest radiograph May 03, 2012  Findings: The ventilation study shows homogeneous and symmetric uptake of radiotracer with prompt washout bilaterally.  The perfusion study shows homogeneous and  symmetric uptake without appreciable perfusion  defect.  There is no appreciable ventilation / perfusion mismatch.  IMPRESSION: Essentially normal ventilation and perfusion lung scans.  Very low probability of pulmonary embolus.   Original Report Authenticated By: Lowella Grip, M.D.      Disposition and Follow-up:     Discharge Orders    Future Appointments: Provider: Department: Dept Phone: Center:   05/25/2012 10:00 AM [place] [phone] None   05/25/2012 10:15 AM [facility] [phone] None   02/23/2013 11:30 AM [place] [phone] None   02/23/2013 12:00 PM Gordy Levan, MD [place] [phone] None     Future Orders Please Complete By Expires   Ambulatory referral to Neurology      Comments:   Tremors, shakiness, r/o movement disorder   Diet Carb Modified      Increase activity slowly      (HEART FAILURE PATIENTS) Call MD:  Anytime you have any of the following symptoms: 1) 3 pound weight gain in 24 hours or 5 pounds in 1 week 2) shortness of breath, with or without a dry hacking cough 3) swelling in the hands, feet or stomach 4) if you have to sleep on extra pillows at night in order to breathe.          DIET: Carb modified diet  ACTIVITY: Continue physical therapy  TESTS THAT NEED FOLLOW-UP PT/INR  DISCHARGE FOLLOW-UP Follow-up Information    Follow up with Sanda Klein, MD. On 05/21/2012. (at 8:30 am)    Contact information:  [address] [place] [address] [phone]       Follow up with [name], MD. Schedule an appointment as soon as possible for a visit in 10 days. (for hospital follow-up)    Contact information:   [address] [place] [phone]          Time spent on Discharge: 42 mins  Signed:   RAI,RIPUDEEP M.D. [facility] 05/11/2012, 9:59 AM Pager: [phone]

## 2012-05-25 ENCOUNTER — Ambulatory Visit (HOSPITAL_BASED_OUTPATIENT_CLINIC_OR_DEPARTMENT_OTHER): Payer: Medicaid Other | Admitting: Pharmacist

## 2012-05-25 ENCOUNTER — Other Ambulatory Visit: Payer: Medicaid Other | Admitting: Lab

## 2012-05-25 DIAGNOSIS — I82409 Acute embolism and thrombosis of unspecified deep veins of unspecified lower extremity: Secondary | ICD-10-CM

## 2012-05-25 LAB — PROTIME-INR
INR: 2.2 (ref 2.00–3.50)
Protime: 26.4 Seconds — ABNORMAL HIGH (ref 10.6–13.4)

## 2012-05-25 LAB — POCT INR: INR: 2.2

## 2012-05-25 NOTE — Progress Notes (Signed)
INR = 2.2 on Coumadin 7.5mg  daily except 5mg  on Tuesday, Thursday and Sunday.  Recent hospital stay w/ neg cardiac w/u. Pt has been retaining fluid but no SOB. INR at goal.  No change to Coumadin dose necessary. Return in 6 weeks. Budd Palmer, Pharm.D.

## 2012-07-06 ENCOUNTER — Other Ambulatory Visit (HOSPITAL_BASED_OUTPATIENT_CLINIC_OR_DEPARTMENT_OTHER): Payer: Medicaid Other | Admitting: Lab

## 2012-07-06 ENCOUNTER — Ambulatory Visit (HOSPITAL_BASED_OUTPATIENT_CLINIC_OR_DEPARTMENT_OTHER): Payer: Medicaid Other | Admitting: Pharmacist

## 2012-07-06 DIAGNOSIS — I82409 Acute embolism and thrombosis of unspecified deep veins of unspecified lower extremity: Secondary | ICD-10-CM

## 2012-07-06 LAB — PROTIME-INR: Protime: 18 Seconds — ABNORMAL HIGH (ref 10.6–13.4)

## 2012-07-06 NOTE — Progress Notes (Signed)
INR = 1.5 Pt takes 7.5 mg/day; 5 mg Tu/Th/Sun. She has had roasted asparagus & also broccoli this week. INR low due to increased vit K intake. Boost w/ 7.5 mg today only then back to usual dose. Repeat protime 07/27/12 (3 weeks). Kennith Center, Pharm.D., CPP 07/06/2012@10 :14 AM

## 2012-07-26 ENCOUNTER — Encounter: Payer: Self-pay | Admitting: Pharmacist

## 2012-07-27 ENCOUNTER — Ambulatory Visit (HOSPITAL_BASED_OUTPATIENT_CLINIC_OR_DEPARTMENT_OTHER): Payer: Medicaid Other | Admitting: Pharmacist

## 2012-07-27 ENCOUNTER — Other Ambulatory Visit: Payer: Medicaid Other | Admitting: Lab

## 2012-07-27 DIAGNOSIS — I82409 Acute embolism and thrombosis of unspecified deep veins of unspecified lower extremity: Secondary | ICD-10-CM

## 2012-07-27 DIAGNOSIS — I82403 Acute embolism and thrombosis of unspecified deep veins of lower extremity, bilateral: Secondary | ICD-10-CM

## 2012-07-27 LAB — PROTIME-INR: Protime: 19.2 Seconds — ABNORMAL HIGH (ref 10.6–13.4)

## 2012-07-27 LAB — POCT INR: INR: 1.6

## 2012-07-27 NOTE — Progress Notes (Signed)
INR below goal today. Pt took coumadin as instructed at last visit. Pt main complaint is that she is retaining fluid. Her PCP is adjusting her diuretic. No problems to report regarding anticoagulation. Medication list was updated today with her medication changes. Apidra was changed to Novolog, Lasix 20mg  daily and Vitamin D 2000 units daily were added to the medication list. Pt will take coumadin 7.5 mg today.  On 07/28/12, slightly increase coumadin to 7.5mg  daily except 5mg  on Sun & Tu.  Recheck INR on 08/10/12 at 9:30 am for lab and 9:45 am for Coumadin Clinic.

## 2012-07-27 NOTE — Patient Instructions (Addendum)
Take 7.5 mg today.  On 07/28/12, slightly increase coumadin to 7.5mg  daily except 5mg  on Sun & Tu.  Recheck INR on 08/10/12 at 9:30 am for lab and 9:45 am for Coumadin Clinic.

## 2012-08-10 ENCOUNTER — Other Ambulatory Visit: Payer: Medicaid Other | Admitting: Lab

## 2012-08-10 ENCOUNTER — Ambulatory Visit: Payer: Medicaid Other

## 2012-08-10 ENCOUNTER — Telehealth: Payer: Self-pay | Admitting: Pharmacist

## 2012-08-10 NOTE — Telephone Encounter (Signed)
Ms [name] called to cancel coumadin clinic appt.  She is not feeling well.  Will call back to reschedule.

## 2012-08-16 ENCOUNTER — Other Ambulatory Visit (HOSPITAL_BASED_OUTPATIENT_CLINIC_OR_DEPARTMENT_OTHER): Payer: Medicaid Other | Admitting: Lab

## 2012-08-16 ENCOUNTER — Ambulatory Visit (HOSPITAL_BASED_OUTPATIENT_CLINIC_OR_DEPARTMENT_OTHER): Payer: Medicaid Other | Admitting: Pharmacist

## 2012-08-16 ENCOUNTER — Other Ambulatory Visit: Payer: Self-pay | Admitting: Internal Medicine

## 2012-08-16 DIAGNOSIS — I82409 Acute embolism and thrombosis of unspecified deep veins of unspecified lower extremity: Secondary | ICD-10-CM

## 2012-08-16 DIAGNOSIS — I82403 Acute embolism and thrombosis of unspecified deep veins of lower extremity, bilateral: Secondary | ICD-10-CM

## 2012-08-16 DIAGNOSIS — R42 Dizziness and giddiness: Secondary | ICD-10-CM

## 2012-08-16 LAB — POCT INR: INR: 1.4

## 2012-08-16 LAB — PROTIME-INR: Protime: 16.8 Seconds — ABNORMAL HIGH (ref 10.6–13.4)

## 2012-08-16 NOTE — Progress Notes (Signed)
INR continues to be below goal of 2-2.2.  Ms Mctague fell and hit head about 2 weeks ago.  She did not lose consciousness and did not seek medical attention.  I encouraged Ms Gambrell to call to get PT/INR checked when things like this happen.  She has doubled her furosemide dose at her own discretion because she had so much edema.  She states that she has lost 14lb since 08/12/12.  I urged Ms Adinolfi to call PCP to set up appt to be evaluated.  Ms Nuckolls has also started taking tumeric.  Tumeric can increase risk of bleeding while on warfarin, so she will stop the tumeric.  Ms Hyett said that she may have missed her coumadin dose on 08/14/12.  INR has been below goal for several weeks now.  Will increase coumadin to 7.5mg  daily, which is about 10% increase in dose.  Will check PT/INR in 2 weeks.

## 2012-08-19 ENCOUNTER — Ambulatory Visit
Admission: RE | Admit: 2012-08-19 | Discharge: 2012-08-19 | Disposition: A | Discharge status: Home / Self Care | Payer: Medicaid Other | Source: Ambulatory Visit | Attending: Internal Medicine | Admitting: Internal Medicine

## 2012-08-19 DIAGNOSIS — R42 Dizziness and giddiness: Secondary | ICD-10-CM

## 2012-09-01 ENCOUNTER — Ambulatory Visit (HOSPITAL_BASED_OUTPATIENT_CLINIC_OR_DEPARTMENT_OTHER): Payer: Medicaid Other | Admitting: Pharmacist

## 2012-09-01 ENCOUNTER — Other Ambulatory Visit (HOSPITAL_BASED_OUTPATIENT_CLINIC_OR_DEPARTMENT_OTHER): Payer: Medicaid Other | Admitting: Lab

## 2012-09-01 DIAGNOSIS — I82409 Acute embolism and thrombosis of unspecified deep veins of unspecified lower extremity: Secondary | ICD-10-CM

## 2012-09-01 DIAGNOSIS — I82403 Acute embolism and thrombosis of unspecified deep veins of lower extremity, bilateral: Secondary | ICD-10-CM

## 2012-09-01 LAB — POCT INR: INR: 1.6

## 2012-09-01 NOTE — Progress Notes (Signed)
INR = 1.6 on Coumadin 7.5 mg daily She has been eating a lot of salads & using Ranch salad dressing (contains mayonnaise; can alter INR) She fell 08/16/12.  She did see her PCP.  Head CT neg for bleeding.  CT showed sinusitis; no med changes. Dizziness.  Room "spins" when she is laying down. Varicose veins in her chest (under R breast & sternum).  These are not painful. INR still subtherapeutic.  Potentially due to diet?? I will increase her to 10 mg MWF & 7.5 mg other days. Repeat INR next Friday. Kennith Center, Pharm.D., CPP 09/01/2012@10 :13 AM

## 2012-09-06 ENCOUNTER — Other Ambulatory Visit (HOSPITAL_COMMUNITY): Payer: Self-pay | Admitting: Internal Medicine

## 2012-09-06 ENCOUNTER — Other Ambulatory Visit (HOSPITAL_COMMUNITY): Payer: Self-pay | Admitting: Cardiology

## 2012-09-06 DIAGNOSIS — Z1231 Encounter for screening mammogram for malignant neoplasm of breast: Secondary | ICD-10-CM

## 2012-09-07 ENCOUNTER — Other Ambulatory Visit: Payer: Self-pay | Admitting: Pharmacist

## 2012-09-07 DIAGNOSIS — I82409 Acute embolism and thrombosis of unspecified deep veins of unspecified lower extremity: Secondary | ICD-10-CM

## 2012-09-07 MED ORDER — WARFARIN SODIUM 2.5 MG PO TABS: 7.5000 mg | ORAL_TABLET | Freq: Every day | ORAL | Status: DC

## 2012-09-08 ENCOUNTER — Encounter (HOSPITAL_COMMUNITY): Payer: Self-pay | Admitting: Radiology

## 2012-09-08 ENCOUNTER — Emergency Department (HOSPITAL_COMMUNITY): Payer: No Typology Code available for payment source

## 2012-09-08 ENCOUNTER — Emergency Department (HOSPITAL_COMMUNITY)
Admission: EM | Admit: 2012-09-08 | Discharge: 2012-09-08 | Disposition: A | Discharge status: Home / Self Care | Payer: No Typology Code available for payment source | Attending: Emergency Medicine | Admitting: Emergency Medicine

## 2012-09-08 DIAGNOSIS — Z7901 Long term (current) use of anticoagulants: Secondary | ICD-10-CM | POA: Insufficient documentation

## 2012-09-08 DIAGNOSIS — F329 Major depressive disorder, single episode, unspecified: Secondary | ICD-10-CM | POA: Insufficient documentation

## 2012-09-08 DIAGNOSIS — S161XXA Strain of muscle, fascia and tendon at neck level, initial encounter: Secondary | ICD-10-CM

## 2012-09-08 DIAGNOSIS — S8990XA Unspecified injury of unspecified lower leg, initial encounter: Secondary | ICD-10-CM | POA: Insufficient documentation

## 2012-09-08 DIAGNOSIS — E1129 Type 2 diabetes mellitus with other diabetic kidney complication: Secondary | ICD-10-CM | POA: Insufficient documentation

## 2012-09-08 DIAGNOSIS — Z86718 Personal history of other venous thrombosis and embolism: Secondary | ICD-10-CM | POA: Insufficient documentation

## 2012-09-08 DIAGNOSIS — Z79899 Other long term (current) drug therapy: Secondary | ICD-10-CM | POA: Insufficient documentation

## 2012-09-08 DIAGNOSIS — Z8679 Personal history of other diseases of the circulatory system: Secondary | ICD-10-CM | POA: Insufficient documentation

## 2012-09-08 DIAGNOSIS — S59909A Unspecified injury of unspecified elbow, initial encounter: Secondary | ICD-10-CM | POA: Insufficient documentation

## 2012-09-08 DIAGNOSIS — S0990XA Unspecified injury of head, initial encounter: Secondary | ICD-10-CM | POA: Insufficient documentation

## 2012-09-08 DIAGNOSIS — R609 Edema, unspecified: Secondary | ICD-10-CM | POA: Insufficient documentation

## 2012-09-08 DIAGNOSIS — Z794 Long term (current) use of insulin: Secondary | ICD-10-CM | POA: Insufficient documentation

## 2012-09-08 DIAGNOSIS — Z7982 Long term (current) use of aspirin: Secondary | ICD-10-CM | POA: Insufficient documentation

## 2012-09-08 DIAGNOSIS — I129 Hypertensive chronic kidney disease with stage 1 through stage 4 chronic kidney disease, or unspecified chronic kidney disease: Secondary | ICD-10-CM | POA: Insufficient documentation

## 2012-09-08 DIAGNOSIS — Z8739 Personal history of other diseases of the musculoskeletal system and connective tissue: Secondary | ICD-10-CM | POA: Insufficient documentation

## 2012-09-08 DIAGNOSIS — S6990XA Unspecified injury of unspecified wrist, hand and finger(s), initial encounter: Secondary | ICD-10-CM | POA: Insufficient documentation

## 2012-09-08 DIAGNOSIS — Y9389 Activity, other specified: Secondary | ICD-10-CM | POA: Insufficient documentation

## 2012-09-08 DIAGNOSIS — S139XXA Sprain of joints and ligaments of unspecified parts of neck, initial encounter: Secondary | ICD-10-CM | POA: Insufficient documentation

## 2012-09-08 DIAGNOSIS — Y92410 Unspecified street and highway as the place of occurrence of the external cause: Secondary | ICD-10-CM | POA: Insufficient documentation

## 2012-09-08 DIAGNOSIS — T148XXA Other injury of unspecified body region, initial encounter: Secondary | ICD-10-CM

## 2012-09-08 DIAGNOSIS — N259 Disorder resulting from impaired renal tubular function, unspecified: Secondary | ICD-10-CM | POA: Insufficient documentation

## 2012-09-08 DIAGNOSIS — Z8669 Personal history of other diseases of the nervous system and sense organs: Secondary | ICD-10-CM | POA: Insufficient documentation

## 2012-09-08 DIAGNOSIS — I509 Heart failure, unspecified: Secondary | ICD-10-CM | POA: Insufficient documentation

## 2012-09-08 DIAGNOSIS — F3289 Other specified depressive episodes: Secondary | ICD-10-CM | POA: Insufficient documentation

## 2012-09-08 LAB — GLUCOSE, CAPILLARY

## 2012-09-08 LAB — CBC
HCT: 32 % — ABNORMAL LOW (ref 36.0–46.0)
Hemoglobin: 10.1 g/dL — ABNORMAL LOW (ref 12.0–15.0)
MCHC: 31.6 g/dL (ref 30.0–36.0)
RBC: 3.55 MIL/uL — ABNORMAL LOW (ref 3.87–5.11)

## 2012-09-08 LAB — COMPREHENSIVE METABOLIC PANEL
ALT: 15 U/L (ref 0–35)
Alkaline Phosphatase: 80 U/L (ref 39–117)
BUN: 35 mg/dL — ABNORMAL HIGH (ref 6–23)
CO2: 31 mEq/L (ref 19–32)
Chloride: 103 mEq/L (ref 96–112)
GFR calc Af Amer: 51 mL/min — ABNORMAL LOW (ref >90)
GFR calc non Af Amer: 44 mL/min — ABNORMAL LOW (ref >90)
Glucose, Bld: 99 mg/dL (ref 70–99)
Potassium: 4.2 mEq/L (ref 3.5–5.1)
Total Bilirubin: 0.4 mg/dL (ref 0.3–1.2)
Total Protein: 6.4 g/dL (ref 6.0–8.3)

## 2012-09-08 LAB — PROTIME-INR: Prothrombin Time: 19.8 seconds — ABNORMAL HIGH (ref 11.6–15.2)

## 2012-09-08 MED ORDER — HYDROCODONE-ACETAMINOPHEN 5-325 MG PO TABS: 1.0000 | ORAL_TABLET | Freq: Four times a day (QID) | ORAL | Status: DC | PRN

## 2012-09-08 MED ORDER — METHOCARBAMOL 500 MG PO TABS: 500.0000 mg | ORAL_TABLET | Freq: Two times a day (BID) | ORAL | Status: DC

## 2012-09-08 MED ORDER — IBUPROFEN 400 MG PO TABS: 400.0000 mg | ORAL_TABLET | Freq: Four times a day (QID) | ORAL | Status: DC | PRN
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
## 2012-09-08 NOTE — ED Notes (Signed)
Left wrist pain also

## 2012-09-08 NOTE — ED Notes (Signed)
Pt states she feels like her sugar is low  

## 2012-09-08 NOTE — ED Notes (Signed)
Pt placed on continuous pulse oximetry and blood pressure cuff; warm blankets given

## 2012-09-10 ENCOUNTER — Telehealth: Payer: Self-pay | Admitting: Pharmacist

## 2012-09-10 ENCOUNTER — Ambulatory Visit: Payer: Medicaid Other

## 2012-09-10 ENCOUNTER — Ambulatory Visit: Payer: Medicaid Other | Admitting: Lab
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
## 2012-09-13 ENCOUNTER — Ambulatory Visit: Payer: Medicaid Other | Admitting: Pharmacist

## 2012-09-13 ENCOUNTER — Other Ambulatory Visit (HOSPITAL_BASED_OUTPATIENT_CLINIC_OR_DEPARTMENT_OTHER): Payer: Medicaid Other | Admitting: Lab

## 2012-09-13 DIAGNOSIS — I82729 Chronic embolism and thrombosis of deep veins of unspecified upper extremity: Secondary | ICD-10-CM

## 2012-09-13 DIAGNOSIS — I82723 Chronic embolism and thrombosis of deep veins of upper extremity, bilateral: Secondary | ICD-10-CM

## 2012-09-13 DIAGNOSIS — I82403 Acute embolism and thrombosis of unspecified deep veins of lower extremity, bilateral: Secondary | ICD-10-CM

## 2012-09-13 LAB — PROTIME-INR
INR: 1.8 — ABNORMAL LOW (ref 2.00–3.50)
Protime: 21.6 Seconds — ABNORMAL HIGH (ref 10.6–13.4)

## 2012-09-13 LAB — POCT INR: INR: 1.8
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
## 2012-09-15 ENCOUNTER — Ambulatory Visit (HOSPITAL_COMMUNITY)
Admission: RE | Admit: 2012-09-15 | Discharge: 2012-09-15 | Disposition: A | Discharge status: Home / Self Care | Payer: Medicaid Other | Source: Ambulatory Visit | Attending: Internal Medicine | Admitting: Internal Medicine

## 2012-09-15 DIAGNOSIS — Z1231 Encounter for screening mammogram for malignant neoplasm of breast: Secondary | ICD-10-CM | POA: Insufficient documentation

## 2012-09-20 ENCOUNTER — Other Ambulatory Visit: Payer: Self-pay | Admitting: *Deleted

## 2012-09-20 MED ORDER — PANTOPRAZOLE SODIUM 40 MG PO TBEC: 40.0000 mg | DELAYED_RELEASE_TABLET | Freq: Every day | ORAL | Status: DC

## 2012-09-27 ENCOUNTER — Other Ambulatory Visit (HOSPITAL_BASED_OUTPATIENT_CLINIC_OR_DEPARTMENT_OTHER): Payer: Medicaid Other | Admitting: Lab

## 2012-09-27 ENCOUNTER — Ambulatory Visit (HOSPITAL_BASED_OUTPATIENT_CLINIC_OR_DEPARTMENT_OTHER): Payer: Medicaid Other | Admitting: Pharmacist

## 2012-09-27 DIAGNOSIS — I82403 Acute embolism and thrombosis of unspecified deep veins of lower extremity, bilateral: Secondary | ICD-10-CM

## 2012-09-27 DIAGNOSIS — I82729 Chronic embolism and thrombosis of deep veins of unspecified upper extremity: Secondary | ICD-10-CM

## 2012-09-27 DIAGNOSIS — I82723 Chronic embolism and thrombosis of deep veins of upper extremity, bilateral: Secondary | ICD-10-CM

## 2012-09-27 DIAGNOSIS — I82409 Acute embolism and thrombosis of unspecified deep veins of unspecified lower extremity: Secondary | ICD-10-CM

## 2012-09-27 NOTE — Progress Notes (Signed)
INR slightly below goal today. INR unchanged from previous visit despite coumadin dose increase. Benicar 20mg  daily started for hypertension. Furosemide increased to 40mg  daily. No other medication changes. No missed doses. No changes in diet. No problems to report regarding anticoagulation. No s/s of clotting. Will slightly further increase coumadin dose. Increase Coumadin to 10 mg daily (4 tablets) except take 7.5 mg (3 tablets) on Fridays.   Recheck PT/INR in 2 weeks on 10/12/12; 10:30 am for lab and 10:45 am for Coumadin clinic.

## 2012-09-27 NOTE — Patient Instructions (Addendum)
Increase Coumadin to 10 mg daily (4 tablets) except take 7.5 mg (3 tablets) on Fridays.   Recheck PT/INR in 2 weeks on 10/12/12; 10:30 am for lab and 10:45 am for Coumadin clinic.

## 2012-10-12 ENCOUNTER — Ambulatory Visit (HOSPITAL_BASED_OUTPATIENT_CLINIC_OR_DEPARTMENT_OTHER): Payer: Medicaid Other | Admitting: Pharmacist

## 2012-10-12 ENCOUNTER — Other Ambulatory Visit (HOSPITAL_BASED_OUTPATIENT_CLINIC_OR_DEPARTMENT_OTHER): Payer: Medicaid Other

## 2012-10-12 DIAGNOSIS — I82723 Chronic embolism and thrombosis of deep veins of upper extremity, bilateral: Secondary | ICD-10-CM

## 2012-10-12 DIAGNOSIS — I82729 Chronic embolism and thrombosis of deep veins of unspecified upper extremity: Secondary | ICD-10-CM

## 2012-10-12 LAB — PROTIME-INR

## 2012-10-12 LAB — POCT INR: INR: 2.2

## 2012-10-12 NOTE — Progress Notes (Signed)
Pt seen in clinic today INR=2.2 on 10 mg daily with 7.5 mg on Friday No changes to report. Continue same dose RTC on 10/26/12 at 10:30 for lab and 10:45 for coumain clinic.

## 2012-10-12 NOTE — Patient Instructions (Signed)
Coumadin 10 mg daily (4 tablets) except take 7.5 mg (3 tablets) on Fridays.  Recheck PT/INR in 2 weeks on 10/26/12; 10:30 am for lab and 10:45 am for Coumadin clinic.

## 2012-10-19 ENCOUNTER — Telehealth: Payer: Self-pay | Admitting: Hematology and Oncology

## 2012-10-19 NOTE — Telephone Encounter (Signed)
UNABLE TO REACH THE PT BY THE PHONE NUMBER LISTED ON THE DEMOGRAPHICS PAGE. MAILED THE PT HER NOV APPT CALENDAR.

## 2012-10-26 ENCOUNTER — Other Ambulatory Visit: Payer: Medicaid Other | Admitting: Lab

## 2012-10-26 ENCOUNTER — Ambulatory Visit: Payer: Medicaid Other

## 2012-10-28 ENCOUNTER — Ambulatory Visit: Payer: Medicaid Other | Admitting: Pharmacist

## 2012-10-28 ENCOUNTER — Other Ambulatory Visit (HOSPITAL_BASED_OUTPATIENT_CLINIC_OR_DEPARTMENT_OTHER): Payer: Medicaid Other | Admitting: Lab

## 2012-10-28 DIAGNOSIS — I82723 Chronic embolism and thrombosis of deep veins of upper extremity, bilateral: Secondary | ICD-10-CM

## 2012-10-28 DIAGNOSIS — I82729 Chronic embolism and thrombosis of deep veins of unspecified upper extremity: Secondary | ICD-10-CM

## 2012-10-28 LAB — PROTIME-INR: INR: 2.3 (ref 2.00–3.50)

## 2012-10-28 NOTE — Progress Notes (Signed)
Patient's INR is near goal today at 2.3 (goal INR 2-2.2).  She reports no bleeding or bruising and reports a consistent diet.  She does complain about some arthritis pain in her legs and wrist.  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    We will recheck the patient's INR in 3 weeks on 11/18/12; lab at 10:30 am and Coumadin Clinic at 10:45.   Dicky Doe, PharmD Clinical Pharmacist 10/28/2012 11:05 AM

## 2012-10-28 NOTE — Patient Instructions (Addendum)
Continue Coumadin 10 mg daily (4 tablets) except take 7.5 mg (3 tablets) on Fridays.  Recheck PT/INR in 3 weeks on 11/18/12; 10:30 am for lab and 10:45 am for Coumadin clinic.

## 2012-11-16 ENCOUNTER — Other Ambulatory Visit: Payer: Self-pay | Admitting: *Deleted

## 2012-11-16 NOTE — Telephone Encounter (Signed)
Medication refill for Pantoprazole 40mg  daily refused; last refill, notation stating patient needing OV for future refills. No OV scheduled.

## 2012-11-18 ENCOUNTER — Ambulatory Visit (HOSPITAL_BASED_OUTPATIENT_CLINIC_OR_DEPARTMENT_OTHER): Payer: Medicaid Other | Admitting: Pharmacist

## 2012-11-18 ENCOUNTER — Other Ambulatory Visit: Payer: Medicaid Other | Admitting: Lab

## 2012-11-18 DIAGNOSIS — I82403 Acute embolism and thrombosis of unspecified deep veins of lower extremity, bilateral: Secondary | ICD-10-CM

## 2012-11-18 DIAGNOSIS — I82409 Acute embolism and thrombosis of unspecified deep veins of unspecified lower extremity: Secondary | ICD-10-CM

## 2012-11-18 DIAGNOSIS — I82723 Chronic embolism and thrombosis of deep veins of upper extremity, bilateral: Secondary | ICD-10-CM

## 2012-11-18 NOTE — Patient Instructions (Signed)
Take 8.75mg  (3&1/2 tablets) today.  On 11/19/12, slightly reduce Coumadin to 10mg  daily (4 tablets) except take 7.5 mg (3 tablets) on Mondays & Fridays.   Recheck PT/INR in 2 weeks on 12/02/12; 10:30 am for lab and 10:45 am for Coumadin clinic.

## 2012-11-18 NOTE — Progress Notes (Signed)
INR slightly above goal today.  INR goal = 2-2.2. No problems/concerns to report regarding anticoagulation. No changes in diet or medications. No missed coumadin doses. Take 8.75mg  (3&1/2 tablets) today.  On 11/19/12, slightly reduce Coumadin to 10mg  daily (4 tablets) except take 7.5 mg (3 tablets) on Mondays & Fridays.   Recheck PT/INR in 2 weeks on 12/02/12; 10:30 am for lab and 10:45 am for Coumadin clinic.

## 2012-11-22 ENCOUNTER — Telehealth: Payer: Self-pay

## 2012-11-22 MED ORDER — PANTOPRAZOLE SODIUM 40 MG PO TBEC: 40.0000 mg | DELAYED_RELEASE_TABLET | Freq: Every day | ORAL | Status: DC

## 2012-11-22 NOTE — Telephone Encounter (Signed)
Rx was sent to pharmacy electronically. 

## 2012-11-23 ENCOUNTER — Other Ambulatory Visit: Payer: Self-pay | Admitting: *Deleted

## 2012-12-02 ENCOUNTER — Other Ambulatory Visit (HOSPITAL_BASED_OUTPATIENT_CLINIC_OR_DEPARTMENT_OTHER): Payer: Medicaid Other | Admitting: Lab

## 2012-12-02 ENCOUNTER — Ambulatory Visit (HOSPITAL_BASED_OUTPATIENT_CLINIC_OR_DEPARTMENT_OTHER): Payer: Medicaid Other | Admitting: Pharmacist

## 2012-12-02 DIAGNOSIS — I82409 Acute embolism and thrombosis of unspecified deep veins of unspecified lower extremity: Secondary | ICD-10-CM

## 2012-12-02 DIAGNOSIS — I82723 Chronic embolism and thrombosis of deep veins of upper extremity, bilateral: Secondary | ICD-10-CM

## 2012-12-02 LAB — PROTIME-INR
INR: 2.4 (ref 2.00–3.50)
Protime: 28.8 Seconds — ABNORMAL HIGH (ref 10.6–13.4)

## 2012-12-02 NOTE — Progress Notes (Signed)
Pt seen in clinic today. INR=2.4 on 10 mg daily with 7.5 on Mon and Fri No changes to report No refills needed Coumadin 10mg  daily (4 tablets) except take 7.5 mg (3 tablets) on Mondays & Fridays.   Recheck PT/INR in 3 weeks on 12/23/12; 10:00 am for lab and 10:15 am for Coumadin clinic.

## 2012-12-02 NOTE — Patient Instructions (Signed)
Coumadin 10mg  daily (4 tablets) except take 7.5 mg (3 tablets) on Mondays & Fridays.  Recheck PT/INR in 3 weeks on 12/23/12; 10:00 am for lab and 10:15 am for Coumadin clinic.

## 2012-12-23 ENCOUNTER — Other Ambulatory Visit (HOSPITAL_BASED_OUTPATIENT_CLINIC_OR_DEPARTMENT_OTHER): Payer: Medicaid Other | Admitting: Lab

## 2012-12-23 ENCOUNTER — Ambulatory Visit (HOSPITAL_BASED_OUTPATIENT_CLINIC_OR_DEPARTMENT_OTHER): Payer: Medicaid Other | Admitting: Pharmacist

## 2012-12-23 DIAGNOSIS — I82409 Acute embolism and thrombosis of unspecified deep veins of unspecified lower extremity: Secondary | ICD-10-CM

## 2012-12-23 DIAGNOSIS — I82723 Chronic embolism and thrombosis of deep veins of upper extremity, bilateral: Secondary | ICD-10-CM

## 2012-12-23 LAB — POCT INR: INR: 2.7

## 2012-12-23 LAB — PROTIME-INR
INR: 2.6 (ref 2.00–3.50)
Protime: 31.2 Seconds — ABNORMAL HIGH (ref 10.6–13.4)

## 2012-12-23 NOTE — Progress Notes (Signed)
INR = 2.6 on Coumadin 10 mg/day; 7.5 mg Mon/Fri Pt has usual bruising.  States she had large bruise on her R arm last week but that is improved. Two weeks ago she missed 2 doses.  She "made up" the doses by taking an extra tablet on Mon & Friday. She tells me today that she recently purchased rugs for her hallway for her dogs (a recommendation from the veterinarian).  She is a high fall risk.  I advised her to use caution. She took Vicodin last night for joint pain.  She has not increased her pain med intake overall; just takes occasionally. INR is above goal.  Reduce dose to 10 mg/day; 7.5 mg today (rather than 10 mg today) then next week she'll take 10 mg/day; 7.5 mg MWF. Repeat protime in 2 weeks. Kennith Center, Pharm.D., CPP 12/23/2012@10 :28 AM

## 2012-12-26 ENCOUNTER — Other Ambulatory Visit: Payer: Self-pay | Admitting: Oncology

## 2012-12-26 DIAGNOSIS — I82409 Acute embolism and thrombosis of unspecified deep veins of unspecified lower extremity: Secondary | ICD-10-CM

## 2012-12-31 ENCOUNTER — Other Ambulatory Visit: Payer: Self-pay | Admitting: *Deleted

## 2013-01-05 ENCOUNTER — Other Ambulatory Visit: Payer: Self-pay | Admitting: *Deleted

## 2013-01-10 ENCOUNTER — Telehealth: Payer: Self-pay | Admitting: Pharmacist

## 2013-01-10 ENCOUNTER — Ambulatory Visit: Payer: Medicaid Other

## 2013-01-10 ENCOUNTER — Other Ambulatory Visit: Payer: Medicaid Other | Admitting: Lab

## 2013-01-14 ENCOUNTER — Ambulatory Visit: Payer: Medicaid Other

## 2013-01-14 ENCOUNTER — Other Ambulatory Visit: Payer: Medicaid Other | Admitting: Lab

## 2013-01-14 ENCOUNTER — Telehealth: Payer: Self-pay | Admitting: Pharmacist

## 2013-01-14 NOTE — Telephone Encounter (Signed)
Patient called this morning stated she cannot come to lab/CC today.  Requested change of appt to Tuesday 01/18/13 at 2:30pm. Scheduling messaged.   Montel Clock, PharmD

## 2013-01-18 ENCOUNTER — Other Ambulatory Visit: Payer: Medicaid Other | Admitting: Lab

## 2013-01-18 ENCOUNTER — Emergency Department (HOSPITAL_COMMUNITY): Payer: Medicaid Other

## 2013-01-18 ENCOUNTER — Ambulatory Visit: Payer: Medicaid Other

## 2013-01-18 ENCOUNTER — Telehealth: Payer: Self-pay | Admitting: Pharmacist

## 2013-01-18 ENCOUNTER — Observation Stay (HOSPITAL_COMMUNITY)
Admission: EM | Admit: 2013-01-18 | Discharge: 2013-01-21 | Disposition: A | Discharge status: Home / Self Care | Payer: Medicaid Other | Attending: Internal Medicine | Admitting: Internal Medicine

## 2013-01-18 ENCOUNTER — Encounter (HOSPITAL_COMMUNITY): Payer: Self-pay | Admitting: Emergency Medicine

## 2013-01-18 DIAGNOSIS — R079 Chest pain, unspecified: Secondary | ICD-10-CM

## 2013-01-18 DIAGNOSIS — I82409 Acute embolism and thrombosis of unspecified deep veins of unspecified lower extremity: Secondary | ICD-10-CM

## 2013-01-18 DIAGNOSIS — D696 Thrombocytopenia, unspecified: Secondary | ICD-10-CM

## 2013-01-18 DIAGNOSIS — G4730 Sleep apnea, unspecified: Secondary | ICD-10-CM

## 2013-01-18 DIAGNOSIS — Z794 Long term (current) use of insulin: Secondary | ICD-10-CM

## 2013-01-18 DIAGNOSIS — R001 Bradycardia, unspecified: Secondary | ICD-10-CM

## 2013-01-18 DIAGNOSIS — R251 Tremor, unspecified: Secondary | ICD-10-CM

## 2013-01-18 DIAGNOSIS — IMO0001 Reserved for inherently not codable concepts without codable children: Secondary | ICD-10-CM

## 2013-01-18 DIAGNOSIS — I10 Essential (primary) hypertension: Secondary | ICD-10-CM

## 2013-01-18 DIAGNOSIS — D649 Anemia, unspecified: Secondary | ICD-10-CM

## 2013-01-18 DIAGNOSIS — E162 Hypoglycemia, unspecified: Secondary | ICD-10-CM

## 2013-01-18 DIAGNOSIS — E119 Type 2 diabetes mellitus without complications: Secondary | ICD-10-CM

## 2013-01-18 DIAGNOSIS — N390 Urinary tract infection, site not specified: Secondary | ICD-10-CM

## 2013-01-18 DIAGNOSIS — I5189 Other ill-defined heart diseases: Secondary | ICD-10-CM

## 2013-01-18 DIAGNOSIS — Z86711 Personal history of pulmonary embolism: Secondary | ICD-10-CM

## 2013-01-18 DIAGNOSIS — Z95828 Presence of other vascular implants and grafts: Secondary | ICD-10-CM

## 2013-01-18 DIAGNOSIS — R109 Unspecified abdominal pain: Secondary | ICD-10-CM

## 2013-01-18 DIAGNOSIS — I447 Left bundle-branch block, unspecified: Secondary | ICD-10-CM

## 2013-01-18 DIAGNOSIS — E785 Hyperlipidemia, unspecified: Secondary | ICD-10-CM

## 2013-01-18 DIAGNOSIS — N184 Chronic kidney disease, stage 4 (severe): Secondary | ICD-10-CM

## 2013-01-18 DIAGNOSIS — K625 Hemorrhage of anus and rectum: Secondary | ICD-10-CM

## 2013-01-18 DIAGNOSIS — Z7901 Long term (current) use of anticoagulants: Secondary | ICD-10-CM

## 2013-01-18 DIAGNOSIS — I272 Pulmonary hypertension, unspecified: Secondary | ICD-10-CM

## 2013-01-18 HISTORY — DX: Other chronic pain: G89.29

## 2013-01-18 HISTORY — DX: Anemia, unspecified: D64.9

## 2013-01-18 HISTORY — DX: Adverse effect of anticoagulants, initial encounter: T45.515A

## 2013-01-18 HISTORY — DX: Obstructive sleep apnea (adult) (pediatric): G47.33

## 2013-01-18 HISTORY — DX: Cardiac murmur, unspecified: R01.1

## 2013-01-18 HISTORY — DX: Type 2 diabetes mellitus without complications: E11.9

## 2013-01-18 HISTORY — DX: Hypothyroidism, unspecified: E03.9

## 2013-01-18 HISTORY — DX: Pure hypercholesterolemia, unspecified: E78.00

## 2013-01-18 HISTORY — DX: Shortness of breath: R06.02

## 2013-01-18 HISTORY — DX: Hemorrhage, not elsewhere classified: R58

## 2013-01-18 HISTORY — DX: Anxiety disorder, unspecified: F41.9

## 2013-01-18 HISTORY — DX: Personal history of other medical treatment: Z92.89

## 2013-01-18 HISTORY — DX: Pneumonia, unspecified organism: J18.9

## 2013-01-18 HISTORY — DX: Low back pain: M54.5

## 2013-01-18 HISTORY — DX: Gastro-esophageal reflux disease without esophagitis: K21.9

## 2013-01-18 HISTORY — DX: Headache: R51

## 2013-01-18 HISTORY — DX: Gout, unspecified: M10.9

## 2013-01-18 HISTORY — DX: Low back pain, unspecified: M54.50

## 2013-01-18 HISTORY — DX: Migraine, unspecified, not intractable, without status migrainosus: G43.909

## 2013-01-18 LAB — COMPREHENSIVE METABOLIC PANEL
ALT: 13 U/L (ref 0–35)
AST: 17 U/L (ref 0–37)
Alkaline Phosphatase: 72 U/L (ref 39–117)
BUN: 30 mg/dL — ABNORMAL HIGH (ref 6–23)
CO2: 26 mEq/L (ref 19–32)
Calcium: 8.9 mg/dL (ref 8.4–10.5)
Chloride: 99 mEq/L (ref 96–112)
Creatinine, Ser: 1.29 mg/dL — ABNORMAL HIGH (ref 0.50–1.10)
GFR calc Af Amer: 53 mL/min — ABNORMAL LOW (ref >90)
GFR calc non Af Amer: 45 mL/min — ABNORMAL LOW (ref >90)
Sodium: 138 mEq/L (ref 135–145)
Total Bilirubin: 0.3 mg/dL (ref 0.3–1.2)
Total Protein: 7.1 g/dL (ref 6.0–8.3)

## 2013-01-18 LAB — CBC WITH DIFFERENTIAL/PLATELET
Basophils Absolute: 0 10*3/uL (ref 0.0–0.1)
Eosinophils Absolute: 0.2 10*3/uL (ref 0.0–0.7)
Eosinophils Relative: 3 % (ref 0–5)
HCT: 33.3 % — ABNORMAL LOW (ref 36.0–46.0)
MCH: 29 pg (ref 26.0–34.0)
MCHC: 32.1 g/dL (ref 30.0–36.0)
MCV: 90.2 fL (ref 78.0–100.0)
Monocytes Absolute: 0.5 10*3/uL (ref 0.1–1.0)
Platelets: 193 10*3/uL (ref 150–400)
RDW: 14.5 % (ref 11.5–15.5)
WBC: 6.2 10*3/uL (ref 4.0–10.5)

## 2013-01-18 LAB — CG4 I-STAT (LACTIC ACID): Lactic Acid, Venous: 0.76 mmol/L (ref 0.5–2.2)

## 2013-01-18 LAB — GLUCOSE, CAPILLARY
Glucose-Capillary: 234 mg/dL — ABNORMAL HIGH (ref 70–99)
Glucose-Capillary: 165 mg/dL — ABNORMAL HIGH (ref 70–99)

## 2013-01-18 LAB — PROTIME-INR
INR: 3.12 — ABNORMAL HIGH (ref 0.00–1.49)
Prothrombin Time: 31 seconds — ABNORMAL HIGH (ref 11.6–15.2)

## 2013-01-18 LAB — PRO B NATRIURETIC PEPTIDE: Pro B Natriuretic peptide (BNP): 2253 pg/mL — ABNORMAL HIGH (ref 0–125)

## 2013-01-18 MED ORDER — IRBESARTAN 300 MG PO TABS
300.0000 mg | ORAL_TABLET | Freq: Every day | ORAL | Status: DC
  Administered 2013-01-19 – 2013-01-21 (×3): 300 mg via ORAL
  Filled 2013-01-18 (×4): qty 1

## 2013-01-18 MED ORDER — IOHEXOL 300 MG/ML  SOLN
80.0000 mL | Freq: Once | INTRAMUSCULAR | Status: AC | PRN
  Administered 2013-01-18: 80 mL via INTRAVENOUS

## 2013-01-18 MED ORDER — DIAZEPAM 5 MG PO TABS: 5.0000 mg | ORAL_TABLET | Freq: Every evening | ORAL | Status: DC | PRN

## 2013-01-18 MED ORDER — SODIUM CHLORIDE 0.9 % IJ SOLN
3.0000 mL | Freq: Two times a day (BID) | INTRAMUSCULAR | Status: DC
  Administered 2013-01-19 – 2013-01-20 (×3): 3 mL via INTRAVENOUS

## 2013-01-18 MED ORDER — GABAPENTIN 300 MG PO CAPS
300.0000 mg | ORAL_CAPSULE | Freq: Two times a day (BID) | ORAL | Status: DC
  Administered 2013-01-18 – 2013-01-20 (×5): 300 mg via ORAL
  Filled 2013-01-18 (×8): qty 1

## 2013-01-18 MED ORDER — IOHEXOL 300 MG/ML  SOLN: 25.0000 mL | INTRAMUSCULAR | Status: AC

## 2013-01-18 MED ORDER — HYDROCODONE-ACETAMINOPHEN 5-325 MG PO TABS
1.0000 | ORAL_TABLET | Freq: Four times a day (QID) | ORAL | Status: DC | PRN
Start: 2013-01-18 — End: 2013-01-21
  Administered 2013-01-20: 1 via ORAL
  Filled 2013-01-18: qty 1

## 2013-01-18 MED ORDER — PANTOPRAZOLE SODIUM 40 MG PO TBEC
40.0000 mg | DELAYED_RELEASE_TABLET | Freq: Every day | ORAL | Status: DC
  Administered 2013-01-19 – 2013-01-20 (×2): 40 mg via ORAL
  Filled 2013-01-18 (×2): qty 1

## 2013-01-18 MED ORDER — INSULIN ASPART 100 UNIT/ML ~~LOC~~ SOLN
0.0000 [IU] | SUBCUTANEOUS | Status: DC
  Administered 2013-01-18 – 2013-01-19 (×2): 3 [IU] via SUBCUTANEOUS

## 2013-01-18 MED ORDER — INSULIN GLARGINE 100 UNIT/ML ~~LOC~~ SOLN
10.0000 [IU] | Freq: Every day | SUBCUTANEOUS | Status: DC
  Administered 2013-01-18: 10 [IU] via SUBCUTANEOUS
  Filled 2013-01-18 (×2): qty 0.1

## 2013-01-18 MED ORDER — METOPROLOL TARTRATE 50 MG PO TABS
50.0000 mg | ORAL_TABLET | Freq: Two times a day (BID) | ORAL | Status: DC
  Administered 2013-01-18 – 2013-01-21 (×6): 50 mg via ORAL
  Filled 2013-01-18 (×7): qty 1

## 2013-01-18 MED ORDER — ADULT MULTIVITAMIN W/MINERALS CH
1.0000 | ORAL_TABLET | Freq: Every day | ORAL | Status: DC
  Administered 2013-01-18 – 2013-01-20 (×3): 1 via ORAL
  Filled 2013-01-18 (×3): qty 1

## 2013-01-18 MED ORDER — CITALOPRAM HYDROBROMIDE 20 MG PO TABS
20.0000 mg | ORAL_TABLET | Freq: Every day | ORAL | Status: DC
  Administered 2013-01-18 – 2013-01-20 (×3): 20 mg via ORAL
  Filled 2013-01-18 (×4): qty 1

## 2013-01-18 MED ORDER — LEVOTHYROXINE SODIUM 25 MCG PO TABS
25.0000 ug | ORAL_TABLET | Freq: Every day | ORAL | Status: DC
  Administered 2013-01-20: 25 ug via ORAL
  Filled 2013-01-18 (×4): qty 1

## 2013-01-18 MED ORDER — ISOSORBIDE MONONITRATE ER 30 MG PO TB24
30.0000 mg | ORAL_TABLET | Freq: Every day | ORAL | Status: DC
  Administered 2013-01-18 – 2013-01-21 (×4): 30 mg via ORAL
  Filled 2013-01-18 (×4): qty 1

## 2013-01-18 MED ORDER — FERROUS GLUCONATE 324 (38 FE) MG PO TABS
324.0000 mg | ORAL_TABLET | Freq: Two times a day (BID) | ORAL | Status: DC
  Administered 2013-01-19 – 2013-01-20 (×3): 324 mg via ORAL
  Filled 2013-01-18 (×5): qty 1

## 2013-01-18 MED ORDER — VITAMIN K1 10 MG/ML IJ SOLN
5.0000 mg | Freq: Once | INTRAMUSCULAR | Status: AC
  Administered 2013-01-18: 5 mg via INTRAVENOUS
  Filled 2013-01-18: qty 0.5

## 2013-01-18 MED ORDER — METHOCARBAMOL 500 MG PO TABS
500.0000 mg | ORAL_TABLET | Freq: Two times a day (BID) | ORAL | Status: DC
  Administered 2013-01-18 – 2013-01-21 (×6): 500 mg via ORAL
  Filled 2013-01-18 (×7): qty 1

## 2013-01-18 NOTE — ED Provider Notes (Signed)
CSN: [id number]     Arrival date & time 01/18/13  1259 History   First MD Initiated Contact with Patient 01/18/13 1349     Chief Complaint  Patient presents with  . Rectal Bleeding   (Consider location/radiation/quality/duration/timing/severity/associated sxs/prior Treatment) HPI Comments: 57 year old female the past medical history of hypertension, diabetes, CHF, A. fib, DVT and PE presents to the emergency department complaining of rectal bleeding x1 day. Patient states today she went to have a bowel movement and only noticed a large amount of bright blood in the toilet. She is unsure if it was mixed with stool. Denies ever having rectal bleeding in the past. No pain with her bowel movement. Despite triage summary, patient states she was having some lower abdominal pain last night, increasing today, more so on the left. She is on Coumadin. Denies fever, nausea or vomiting. Denies increased urinary frequency, urgency or dysuria. Has a history of iron deficiency anemia which she takes daily iron 4. Also complaining of shortness of breath, worse on exertion which has been present on and off for the past week. Denies chest pain, cough, lightheadedness, dizziness, confusion, syncope.  Patient is a 57 y.o. female presenting with hematochezia. The history is provided by the patient.  Rectal Bleeding Associated symptoms: abdominal pain     Past Medical History  Diagnosis Date  . CHF (congestive heart failure)   . Diabetes mellitus   . Arthritis   . Hypertension   . Depression   . DVT (deep venous thrombosis) 10 years ago  . PE (pulmonary thromboembolism) 3 years ag0  . Renal disorder     kindey function low  . A-fib   . Sleep apnea, has not started titration  05/03/2012   Past Surgical History  Procedure Laterality Date  . Eye surgery    . Cholecystectomy    . Cesarean section    . Ivc      ivc filter   Family History  Problem Relation Age of Onset  . Cerebral aneurysm Mother   .  Hypertension Father    History  Substance Use Topics  . Smoking status: Former Research scientist (life sciences)  . Smokeless tobacco: Never Used  . Alcohol Use: No   OB History   Grav Para Term Preterm Abortions TAB SAB Ect Mult Living                 Review of Systems  Respiratory: Positive for shortness of breath.   Gastrointestinal: Positive for abdominal pain, blood in stool and hematochezia.  All other systems reviewed and are negative.    Allergies  Morphine and related  Home Medications   Current Outpatient Rx  Name  Route  Sig  Dispense  Refill  . aspirin EC 81 MG tablet   Oral   Take 81 mg by mouth daily.         . cholecalciferol (VITAMIN D) 1000 UNITS tablet   Oral   Take 2,000 Units by mouth daily.         . citalopram (CELEXA) 20 MG tablet   Oral   Take 20 mg by mouth at bedtime.         . diazepam (VALIUM) 5 MG tablet   Oral   Take 1 tablet (5 mg total) by mouth at bedtime as needed for anxiety or sleep (muscle spasms). For muscle spasms.   10 tablet   0   . ferrous gluconate (FERGON) 324 MG tablet   Oral   Take 1 tablet (324  mg total) by mouth 2 (two) times daily with a meal.   60 tablet   3   . furosemide (LASIX) 40 MG tablet   Oral   Take 40 mg by mouth daily.          Marland Kitchen gabapentin (NEURONTIN) 300 MG capsule   Oral   Take 1 capsule (300 mg total) by mouth 2 (two) times daily.   60 capsule   3   . HYDROcodone-acetaminophen (NORCO/VICODIN) 5-325 MG per tablet   Oral   Take 1 tablet by mouth every 6 (six) hours as needed for pain.   6 tablet   0   . ibuprofen (ADVIL,MOTRIN) 400 MG tablet   Oral   Take 1 tablet (400 mg total) by mouth every 6 (six) hours as needed for pain.   30 tablet   0   . insulin aspart (NOVOLOG) 100 UNIT/ML injection   Subcutaneous   Inject 10 Units into the skin 3 (three) times daily with meals.         . insulin glargine (LANTUS) 100 UNIT/ML injection   Subcutaneous   Inject 23 Units into the skin at bedtime.           . isosorbide mononitrate (IMDUR) 30 MG 24 hr tablet   Oral   Take 1 tablet (30 mg total) by mouth daily.   30 tablet   3   . levothyroxine (SYNTHROID, LEVOTHROID) 25 MCG tablet   Oral   Take 25 mcg by mouth daily.         . Menthol-Methyl Salicylate (MUSCLE RUB) 10-15 % CREA   Topical   Apply 1 application topically as needed (for pain).         . methocarbamol (ROBAXIN) 500 MG tablet   Oral   Take 1 tablet (500 mg total) by mouth 2 (two) times daily.   20 tablet   0   . metoprolol (LOPRESSOR) 50 MG tablet   Oral   Take 50 mg by mouth 2 (two) times daily.         . Multiple Vitamin (MULITIVITAMIN WITH MINERALS) TABS   Oral   Take 1 tablet by mouth daily.         Marland Kitchen olmesartan (BENICAR) 40 MG tablet   Oral   Take 40 mg by mouth daily.          . Omega-3 Fatty Acids (FISH OIL) 1200 MG CAPS   Oral   Take 1 capsule by mouth 2 (two) times daily.         . pantoprazole (PROTONIX) 40 MG tablet   Oral   Take 1 tablet (40 mg total) by mouth daily at 12 noon.   30 tablet   0     Patient need to call office for appointment before ...   . warfarin (COUMADIN) 2.5 MG tablet   Oral   Take 7.5-10 mg by mouth See admin instructions. 7.5mg   on Monday Wednesday Friday. All other day pt takes 10 mg. ( 4 tabs )          BP 134/79  Pulse 69  Temp(Src) 98.6 F (37 C) (Oral)  Resp 18  Ht 5\' 3"  (1.6 m)  Wt 276 lb (125.193 kg)  BMI 48.9 kg/m2  SpO2 96% Physical Exam  Nursing note and vitals reviewed. Constitutional: She is oriented to person, place, and time. She appears well-developed and well-nourished. No distress.  Morbidly obese.  HENT:  Head: Normocephalic and atraumatic.  Mouth/Throat:  Oropharynx is clear and moist.  Eyes: Conjunctivae are normal.  Neck: Normal range of motion. Neck supple.  Cardiovascular: Normal rate, regular rhythm and normal heart sounds.   Pulmonary/Chest: Effort normal and breath sounds normal. No respiratory distress. She has  no decreased breath sounds. She has no wheezes. She has no rhonchi. She has no rales.  Abdominal: Soft. Normal appearance and bowel sounds are normal. There is tenderness in the left lower quadrant. There is guarding. There is no rigidity and no rebound.  No peritoneal signs. Exam limited by patient's body habitus.  Genitourinary: Rectal exam shows external hemorrhoid (no throbosis, bleeding). Rectal exam shows no tenderness.  Small amount of BRB on exam glove and dried around rectum.   Musculoskeletal: Normal range of motion. She exhibits no edema.  Neurological: She is alert and oriented to person, place, and time.  Skin: Skin is warm and dry. She is not diaphoretic.  Psychiatric: She has a normal mood and affect. Her behavior is normal.    ED Course  Procedures (including critical care time) Labs Review Labs Reviewed  CBC WITH DIFFERENTIAL - Abnormal; Notable for the following:    RBC 3.69 (*)    Hemoglobin 10.7 (*)    HCT 33.3 (*)    Neutrophils Relative % 80 (*)    Lymphocytes Relative 9 (*)    Lymphs Abs 0.6 (*)    All other components within normal limits  COMPREHENSIVE METABOLIC PANEL - Abnormal; Notable for the following:    Glucose, Bld 235 (*)    BUN 30 (*)    Creatinine, Ser 1.29 (*)    Albumin 3.1 (*)    GFR calc non Af Amer 45 (*)    GFR calc Af Amer 53 (*)    All other components within normal limits  GLUCOSE, CAPILLARY - Abnormal; Notable for the following:    Glucose-Capillary 234 (*)    All other components within normal limits   Imaging Review Dg Chest 2 View  01/18/2013   CLINICAL DATA:  Weakness and cough.  EXAM: CHEST  2 VIEW  COMPARISON:  CT chest 03/26/2012 and PA and lateral chest 05/03/2012 and 09/08/2012.  FINDINGS: Heart size is mildly enlarged. Lungs are clear. No edema. No pneumothorax or pleural fluid. No focal bony abnormality.  IMPRESSION: No acute disease.   Electronically Signed   By: Inge Rise M.D.   On: 01/18/2013 13:34    EKG  Interpretation   None       MDM  No diagnosis found.  Patient with rectal bleeding, LLQ abdominal pain. Possible diverticulitis. Labs obtained in triage prior to patient being seen- cbc, cmp. No leukocytosis, hgb stable. CT abdomen/pelvis with contrast pending. Regarding SOB, hx of CHF, will check BNP, troponin. CXR clear. Not wanting pain medication at this time, tender more with palpation. 3:56 PM BNP 2253. Considering CXR clear, lungs clear, doubt heart failure. Troponin negative. CT still pending. Patient signed out to Dr. Tawnya Crook at shift change.  Illene Labrador, PA-C 01/18/13 [phone]

## 2013-01-18 NOTE — ED Notes (Signed)
Patient transported to CT 

## 2013-01-18 NOTE — H&P (Signed)
[facility] History and Physical  [name] [id number] DOB: 07/02/1955 DOA: 01/18/2013  Referring physician: ED PCP: [name], MD  Chief Complaint: BRBPR  HPI: [name] is a 57 y.o. female who presents with c/o bright red blood per rectum after a BM earlier this afternoon.  The patient is on coumadin chronically for multiple thrombotic events previously (3 PEs, numerous DVTs, etc).  She has previously had an episode of rectal bleeding a year ago due to a rectal fissure, but colonoscopy didn't reveal any other source of bleeding.  In the ED she was given Vit K and hospitalist asked to admit  Review of Systems: 12 systems reviewed and otherwise negative.  Past Medical History  Diagnosis Date  . CHF (congestive heart failure)   . Diabetes mellitus   . Arthritis   . Hypertension   . Depression   . DVT (deep venous thrombosis) 10 years ago  . PE (pulmonary thromboembolism) 3 years ag0  . Renal disorder     kindey function low  . A-fib   . Sleep apnea, has not started titration  05/03/2012   Past Surgical History  Procedure Laterality Date  . Eye surgery    . Cholecystectomy    . Cesarean section    . Ivc      ivc filter   Social History:  reports that she has quit smoking. She has never used smokeless tobacco. She reports that she does not drink alcohol or use illicit drugs.   Allergies  Allergen Reactions  . Morphine And Related Rash    Family History  Problem Relation Age of Onset  . Cerebral aneurysm Mother   . Hypertension Father     Prior to Admission medications   Medication Sig Start Date End Date Taking? Authorizing Provider  aspirin EC 81 MG tablet Take 81 mg by mouth daily.   Yes Historical Provider, MD  cholecalciferol (VITAMIN D) 1000 UNITS tablet Take 2,000 Units by mouth daily.   Yes Lauraine Rinne, MD  citalopram (CELEXA) 20 MG tablet Take 20 mg by mouth at bedtime.   Yes Historical Provider, MD  diazepam (VALIUM) 5 MG  tablet Take 1 tablet (5 mg total) by mouth at bedtime as needed for anxiety or sleep (muscle spasms). For muscle spasms. 05/11/12  Yes Ripudeep Krystal Eaton, MD  ferrous gluconate (FERGON) 324 MG tablet Take 1 tablet (324 mg total) by mouth 2 (two) times daily with a meal. 05/11/12  Yes Ripudeep K Rai, MD  furosemide (LASIX) 40 MG tablet Take 40 mg by mouth daily.    Yes Historical Provider, MD  gabapentin (NEURONTIN) 300 MG capsule Take 1 capsule (300 mg total) by mouth 2 (two) times daily. 05/11/12  Yes Ripudeep Krystal Eaton, MD  HYDROcodone-acetaminophen (NORCO/VICODIN) 5-325 MG per tablet Take 1 tablet by mouth every 6 (six) hours as needed for pain. 09/08/12  Yes Varney Biles, MD  ibuprofen (ADVIL,MOTRIN) 400 MG tablet Take 1 tablet (400 mg total) by mouth every 6 (six) hours as needed for pain. 09/08/12  Yes Varney Biles, MD  insulin aspart (NOVOLOG) 100 UNIT/ML injection Inject 10 Units into the skin 3 (three) times daily with meals.   Yes [name], MD  insulin glargine (LANTUS) 100 UNIT/ML injection Inject 23 Units into the skin at bedtime.    Yes Historical Provider, MD  isosorbide mononitrate (IMDUR) 30 MG 24 hr tablet Take 1 tablet (30 mg total) by mouth daily. 05/11/12  Yes Ripudeep Krystal Eaton, MD  levothyroxine (SYNTHROID, LEVOTHROID) 25 MCG tablet Take 25 mcg by mouth daily.   Yes Historical Provider, MD  Menthol-Methyl Salicylate (MUSCLE RUB) 10-15 % CREA Apply 1 application topically as needed (for pain).   Yes Historical Provider, MD  methocarbamol (ROBAXIN) 500 MG tablet Take 1 tablet (500 mg total) by mouth 2 (two) times daily. 09/08/12  Yes Varney Biles, MD  metoprolol (LOPRESSOR) 50 MG tablet Take 50 mg by mouth 2 (two) times daily.   Yes Historical Provider, MD  Multiple Vitamin (MULITIVITAMIN WITH MINERALS) TABS Take 1 tablet by mouth daily.   Yes Historical Provider, MD  olmesartan (BENICAR) 40 MG tablet Take 40 mg by mouth daily.    Yes Historical Provider, MD  Omega-3 Fatty Acids (FISH OIL) 1200 MG  CAPS Take 1 capsule by mouth 2 (two) times daily.   Yes Historical Provider, MD  pantoprazole (PROTONIX) 40 MG tablet Take 1 tablet (40 mg total) by mouth daily at 12 noon. 11/22/12  Yes Mihai Croitoru, MD  warfarin (COUMADIN) 2.5 MG tablet Take 7.5-10 mg by mouth See admin instructions. 7.5mg   on Monday Wednesday Friday. All other day pt takes 10 mg. ( 4 tabs )   Yes Historical Provider, MD   Physical Exam: Filed Vitals:   01/18/13 2000  BP: 180/69  Pulse: 64  Temp:   Resp: 21    General:  NAD, resting comfortably in bed Eyes: PEERLA EOMI ENT: mucous membranes moist Neck: supple w/o JVD Cardiovascular: RRR w/o MRG Respiratory: CTA B Abdomen: soft, mild tenderness, obese, nd, bs+ Skin: no rash nor lesion Musculoskeletal: MAE, full ROM all 4 extremities Psychiatric: normal tone and affect Neurologic: AAOx3, grossly non-focal  Labs on Admission:  Basic Metabolic Panel:  Recent Labs Lab 01/18/13 1310  NA 138  K 4.1  CL 99  CO2 26  GLUCOSE 235*  BUN 30*  CREATININE 1.29*  CALCIUM 8.9   Liver Function Tests:  Recent Labs Lab 01/18/13 1310  AST 17  ALT 13  ALKPHOS 72  BILITOT 0.3  PROT 7.1  ALBUMIN 3.1*   No results found for this basename: LIPASE, AMYLASE,  in the last 168 hours No results found for this basename: AMMONIA,  in the last 168 hours CBC:  Recent Labs Lab 01/18/13 1310  WBC 6.2  NEUTROABS 4.9  HGB 10.7*  HCT 33.3*  MCV 90.2  PLT 193   Cardiac Enzymes:  Recent Labs Lab 01/18/13 1310  TROPONINI <0.30    BNP (last 3 results)  Recent Labs  01/25/12 2241 05/03/12 1435 01/18/13 1310  PROBNP 684.9* 1589.0* 2253.0*   CBG:  Recent Labs Lab 01/18/13 1402  GLUCAP 234*    Radiological Exams on Admission: Dg Chest 2 View  01/18/2013   CLINICAL DATA:  Weakness and cough.  EXAM: CHEST  2 VIEW  COMPARISON:  CT chest 03/26/2012 and PA and lateral chest 05/03/2012 and 09/08/2012.  FINDINGS: Heart size is mildly enlarged. Lungs are  clear. No edema. No pneumothorax or pleural fluid. No focal bony abnormality.  IMPRESSION: No acute disease.   Electronically Signed   By: Inge Rise M.D.   On: 01/18/2013 13:34   Ct Abdomen Pelvis W Contrast  01/18/2013   CLINICAL DATA:  Rectal bleeding.  EXAM: CT ABDOMEN AND PELVIS WITH CONTRAST  TECHNIQUE: Multidetector CT imaging of the abdomen and pelvis was performed using the standard protocol following bolus administration of intravenous contrast.  CONTRAST:  74mL OMNIPAQUE IOHEXOL 300 MG/ML  SOLN  COMPARISON:  08/17/2011.  FINDINGS: Scattered colonic diverticula. No extraluminal bowel inflammatory process, free fluid or free air.  Inferior vena cava filter is in place. Interval development of varices along the anterior abdominal wall. This may reflect decrease flow or thrombosis below the inferior vena cava filter site.  Minimal lobularity of the liver without definitive findings of cirrhosis. Liver is enlarged spanning over 21.8 cm. No focal hepatic lesion. Post cholecystectomy.  No focal splenic, pancreatic, adrenal or renal lesion.  Heart is enlarged with small pericardial effusion. Prominent mitral valve calcification and mild aortic valve calcification. Coronary artery calcification.  Atherosclerotic type changes of the aorta are without aneurysmal dilation. Mild narrowing of the celiac artery and superior mesenteric artery origin. Atherosclerotic type changes of the renal arteries and iliac arteries as well as femoral arteries. Narrowing without occlusion. No aneurysm.  Scoliosis and degenerative changes most prominent L4-5. Various degrees of spinal stenosis throughout the lower thoracic and lumbar spine.  Visualized lung bases are clear.  Very small hiatal hernia may be present.  No adenopathy.  Bilateral mild hip joint degenerative changes and mild sacroiliac joint degenerative changes.  IMPRESSION: Scattered colonic diverticula. No extraluminal bowel inflammatory process, free fluid or  free air.  Inferior vena cava filter is in place. Interval development of varices along the anterior abdominal wall. This may reflect decrease flow or thrombosis below the inferior vena cava filter site.  Minimal lobularity of the liver without definitive findings of cirrhosis. Liver is enlarged spanning over 21.8 cm.  Post cholecystectomy.  Heart is enlarged with small pericardial effusion.  Coronary artery calcification.  Atherosclerotic type changes of the aorta are without aneurysmal dilation. Mild narrowing of the celiac artery and superior mesenteric artery origin. Atherosclerotic type changes of the renal arteries and iliac arteries as well as femoral arteries. Narrowing without occlusion. No aneurysm.  Scoliosis and degenerative changes most prominent L4-5. Various degrees of spinal stenosis throughout the lower thoracic and lumbar spine.   Electronically Signed   By: Chauncey Cruel M.D.   On: 01/18/2013 16:37    EKG: Independently reviewed.  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 Rectal bleeding - patient with BRBPR, does have external hemorrhoids on exam but blood appears to not be coming from these (could be internal hemorrhoids vs diverticular bleed vs other).  Patient hemodynamically stable at this time with SBP of 180 and pulse in the 60s-80s.  Therefore treating with Vit K and holding coumadin, do not feel that FFP is indicated at this time.  HGB of 10.7 which is actually up slightly from 10.2 in feb of this year, therefore no indication for emergent PRBC transfusion at this time.  Continue to monitor, recheck labs in AM, transfuse FFP and PRBC if indicated. 2. HTN - continue home meds other than lasix at this time, patient currently NPO. 3. DM 2- holding home meds and putting  patient on lantus 10 (on 23 at home) and SSI q4h while NPO.    Code Status: Full Code (must indicate code status--if unknown or must be presumed, indicate so) Family Communication: No family in room (indicate person spoken with, if applicable, with phone number if by telephone) Disposition Plan: Admit to inpatient (indicate anticipated LOS)  Time spent: 70 min  Jurell Basista M. [facility] Pager [phone]  If 7PM-7AM,  please contact night-coverage [email or web address] Password [email or web address] 01/18/2013, 9:04 PM

## 2013-01-18 NOTE — ED Notes (Signed)
Dr. Gardner at bedside 

## 2013-01-18 NOTE — Telephone Encounter (Signed)
Patient called stating she was having profuse rectal bleeding that was "pouring" out and not stopping. Pt has not been feeling well the past few days with lack of appetite and unable to have a bowel movement until this bleeding episode today. Pt states the color is bright red. Pt will not come into coumadin clinic today for her appointment but instead will be evaluated in the emergency department.  Thank you,  Montel Clock, PharmD

## 2013-01-18 NOTE — ED Notes (Signed)
Pt presents to department for evaluation of rectal bleeding. Onset today. Currently taking coumadin. Reports hyperactive bowel sounds last night. Denies abdominal pain. No nausea/vomiting. Also states SOB. Respirations unlabored at the time. Pt is conscious alert and oriented x4.

## 2013-01-18 NOTE — ED Notes (Signed)
Pt is in a gown and on the monitor. 

## 2013-01-19 DIAGNOSIS — Z9889 Other specified postprocedural states: Secondary | ICD-10-CM

## 2013-01-19 DIAGNOSIS — K625 Hemorrhage of anus and rectum: Secondary | ICD-10-CM

## 2013-01-19 DIAGNOSIS — I82409 Acute embolism and thrombosis of unspecified deep veins of unspecified lower extremity: Secondary | ICD-10-CM

## 2013-01-19 DIAGNOSIS — I10 Essential (primary) hypertension: Secondary | ICD-10-CM

## 2013-01-19 DIAGNOSIS — D649 Anemia, unspecified: Secondary | ICD-10-CM

## 2013-01-19 DIAGNOSIS — Z5181 Encounter for therapeutic drug level monitoring: Secondary | ICD-10-CM

## 2013-01-19 DIAGNOSIS — Z7901 Long term (current) use of anticoagulants: Secondary | ICD-10-CM

## 2013-01-19 LAB — URINALYSIS, ROUTINE W REFLEX MICROSCOPIC
Bilirubin Urine: NEGATIVE
Glucose, UA: NEGATIVE mg/dL
Specific Gravity, Urine: 1.02 (ref 1.005–1.030)
Urobilinogen, UA: 1 mg/dL (ref 0.0–1.0)

## 2013-01-19 LAB — BASIC METABOLIC PANEL
BUN: 26 mg/dL — ABNORMAL HIGH (ref 6–23)
CO2: 27 mEq/L (ref 19–32)
Calcium: 8.4 mg/dL (ref 8.4–10.5)
Chloride: 102 mEq/L (ref 96–112)
Creatinine, Ser: 1.06 mg/dL (ref 0.50–1.10)
GFR calc Af Amer: 67 mL/min — ABNORMAL LOW (ref >90)
Glucose, Bld: 127 mg/dL — ABNORMAL HIGH (ref 70–99)
Potassium: 4.4 mEq/L (ref 3.5–5.1)

## 2013-01-19 LAB — CBC
HCT: 29.2 % — ABNORMAL LOW (ref 36.0–46.0)
MCH: 28.9 pg (ref 26.0–34.0)
MCHC: 32.2 g/dL (ref 30.0–36.0)
MCV: 89.8 fL (ref 78.0–100.0)
Platelets: 174 10*3/uL (ref 150–400)
RBC: 3.25 MIL/uL — ABNORMAL LOW (ref 3.87–5.11)
WBC: 4.4 10*3/uL (ref 4.0–10.5)

## 2013-01-19 LAB — GLUCOSE, CAPILLARY
Glucose-Capillary: 100 mg/dL — ABNORMAL HIGH (ref 70–99)
Glucose-Capillary: 139 mg/dL — ABNORMAL HIGH (ref 70–99)
Glucose-Capillary: 187 mg/dL — ABNORMAL HIGH (ref 70–99)

## 2013-01-19 LAB — URINE MICROSCOPIC-ADD ON

## 2013-01-19 LAB — HEMOGLOBIN A1C
Hgb A1c MFr Bld: 6.2 % — ABNORMAL HIGH (ref <5.7)
Mean Plasma Glucose: 131 mg/dL — ABNORMAL HIGH (ref <117)

## 2013-01-19 MED ORDER — INSULIN ASPART 100 UNIT/ML ~~LOC~~ SOLN: 0.0000 [IU] | Freq: Every day | SUBCUTANEOUS | Status: DC

## 2013-01-19 MED ORDER — INSULIN GLARGINE 100 UNIT/ML ~~LOC~~ SOLN
23.0000 [IU] | Freq: Every day | SUBCUTANEOUS | Status: DC
  Administered 2013-01-19: 23 [IU] via SUBCUTANEOUS
  Filled 2013-01-19 (×3): qty 0.23

## 2013-01-19 MED ORDER — INSULIN ASPART 100 UNIT/ML ~~LOC~~ SOLN
6.0000 [IU] | Freq: Three times a day (TID) | SUBCUTANEOUS | Status: DC
  Administered 2013-01-19 – 2013-01-20 (×2): 6 [IU] via SUBCUTANEOUS
  Administered 2013-01-20: 13:00:00 via SUBCUTANEOUS
  Administered 2013-01-20: 6 [IU] via SUBCUTANEOUS

## 2013-01-19 MED ORDER — INSULIN ASPART 100 UNIT/ML ~~LOC~~ SOLN: 4.0000 [IU] | Freq: Three times a day (TID) | SUBCUTANEOUS | Status: DC

## 2013-01-19 MED ORDER — INSULIN ASPART 100 UNIT/ML ~~LOC~~ SOLN: 0.0000 [IU] | Freq: Three times a day (TID) | SUBCUTANEOUS | Status: DC

## 2013-01-19 MED ORDER — INFLUENZA VAC SPLIT QUAD 0.5 ML IM SUSP
0.5000 mL | Freq: Once | INTRAMUSCULAR | Status: AC
  Administered 2013-01-19: 0.5 mL via INTRAMUSCULAR
  Filled 2013-01-19: qty 0.5

## 2013-01-19 MED ORDER — INSULIN ASPART 100 UNIT/ML ~~LOC~~ SOLN
0.0000 [IU] | Freq: Three times a day (TID) | SUBCUTANEOUS | Status: DC
  Administered 2013-01-19 – 2013-01-20 (×2): 3 [IU] via SUBCUTANEOUS
  Administered 2013-01-20: 13:00:00 via SUBCUTANEOUS

## 2013-01-19 NOTE — ED Provider Notes (Signed)
  Medical screening examination/treatment/procedure(s) were performed by non-physician practitioner and as supervising physician I was immediately available for consultation/collaboration.    Carmin Muskrat, MD 01/19/13 [phone]

## 2013-01-19 NOTE — Progress Notes (Signed)
Nutrition Brief Note  Patient identified on the Malnutrition Screening Tool (MST) Report for 22 lb weight loss in the past 2 months and poor appetite. Per review of usual weights below, patient has not lost a significant amount of weight in the past year.   Wt Readings from Last 15 Encounters:  01/18/13 275 lb 9.6 oz (125.011 kg)  05/11/12 268 lb 1.3 oz (121.6 kg)  02/24/12 274 lb 8 oz (124.512 kg)  01/25/12 267 lb (121.11 kg)  10/15/11 282 lb 9.6 oz (128.187 kg)  10/01/11 282 lb (127.914 kg)  09/04/11 292 lb 12.3 oz (132.8 kg)    Body mass index is 48.83 kg/(m^2). Patient meets criteria for class 3, extreme/morbid obesity based on current BMI.   Current diet order is Heart Healthy, patient is tolerating well at this time. Labs and medications reviewed.   No nutrition interventions warranted at this time. If nutrition issues arise, please consult RD.   Molli Barrows, RD, LDN, [place] Pager [phone] After Hours Pager [phone]

## 2013-01-19 NOTE — Consult Note (Signed)
Reason for Consult: Rectal bleeding/LLQ pain. Referring Physician: THP  [name] is an 57 y.o. female.  HPI: 57 year old, morbidly obese, white female with multiple medical issues listed below, gives a history of worsening fatigue over the last week along with rectal bleeding that started on 01/18/13 after she had a BM. She claims she noticed a lot of blood on the toilet tissue and in the commode on 2 occasions. She had some LLQ pain when she came in but this has since resolved. She denies any problems with dysphagia, odynophagia, melena, fever, chills or rigors. She is on a PPI for reflux. 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 She had an DVT about 10 years ago and has an IVC fliter that placed last year. She had a colonoscopy done in 2008 when she was found to have a few scattered diverticula.   Past Medical History  Diagnosis Date  . Hypertension   . Depression   . DVT (deep venous thrombosis) 10 years ago    numerous/notes 01/18/2013  . PE (pulmonary thromboembolism) 3 years ag0    3/notes 01/18/2013  . A-fib   . Bleeding on Coumadin 08/2012; 01/18/2013    [facility] admissions (01/19/2013)  . High cholesterol     "been off RX for this at one time" (01/18/2013)  . Heart murmur   . CHF (congestive heart failure)     "2-3 times" (01/19/2013)  . Pneumonia before 2011    "once' (01/18/2013)  . Shortness of breath     "only related to my CHF" (01/18/2013)  . Hypothyroidism   . Type II diabetes mellitus   . OSA (obstructive sleep apnea)     "sent me for test in 04/2012; never ordered mask, etc" (01/19/2013)  . Anemia   . History of blood transfusion 1983; 04/2012    "3 w/ childbirth; hospitalized for pain" (01/19/2013)  . GERD (gastroesophageal reflux disease)   . [id number])     "maybe weekly" (01/19/2013)  . Migraines     "twice/yr maybe" (01/19/2013)  . Arthritis     "right hip; both knees; left wrist/shoulder; back" (01/19/2013"  . Chronic  lower back pain   . Gout   . Anxiety   . Renal disorder     kindey function low; "Metformin was destroying my kidneys" (01/19/2013)   Past Surgical History  Procedure Laterality Date  . Cesarean section  1983  . Cholecystectomy  ~ 2002  . Vena cava filter placement  2011?  [facility] surgery Bilateral     "multiple" (01/18/2013)  . Cataract extraction w/ intraocular lens  implant, bilateral Bilateral 2006-2011  . Pars plana vitrectomy Bilateral 2004-2006    "several" (01/18/2013)  . Pars plana repair of retinal deatachment Right   . Refractive surgery Bilateral     "for stigmatism" (01/18/2013)  . Refractive surgery Left ~ 11/2012    "to puff it up cause vision got hazy" (01/18/2013)   Family History  Problem Relation Age of Onset  . Cerebral aneurysm Mother   . Hypertension Father    Social History:  reports that she has quit smoking. Her smoking use included Cigarettes. She has a 1.5 pack-year smoking history. She has never used smokeless tobacco. She reports that she drinks alcohol. She reports that she does not use illicit drugs.  Allergies:  Allergies  Allergen Reactions  . Morphine And Related Rash   Medications: I have reviewed the patient's current medications.  Results for orders  placed during the hospital encounter of 01/18/13 (from the past 48 hour(s))  CBC WITH DIFFERENTIAL     Status: Abnormal   Collection Time    01/18/13  1:10 PM      Result Value Range   WBC 6.2  4.0 - 10.5 K/uL   RBC 3.69 (*) 3.87 - 5.11 MIL/uL   Hemoglobin 10.7 (*) 12.0 - 15.0 g/dL   HCT 33.3 (*) 36.0 - 46.0 %   MCV 90.2  78.0 - 100.0 fL   MCH 29.0  26.0 - 34.0 pg   MCHC 32.1  30.0 - 36.0 g/dL   RDW 14.5  11.5 - 15.5 %   Platelets 193  150 - 400 K/uL   Neutrophils Relative % 80 (*) 43 - 77 %   Neutro Abs 4.9  1.7 - 7.7 K/uL   Lymphocytes Relative 9 (*) 12 - 46 %   Lymphs Abs 0.6 (*) 0.7 - 4.0 K/uL   Monocytes Relative 8  3 - 12 %   Monocytes Absolute 0.5  0.1 - 1.0 K/uL    Eosinophils Relative 3  0 - 5 %   Eosinophils Absolute 0.2  0.0 - 0.7 K/uL   Basophils Relative 0  0 - 1 %   Basophils Absolute 0.0  0.0 - 0.1 K/uL  COMPREHENSIVE METABOLIC PANEL     Status: Abnormal   Collection Time    01/18/13  1:10 PM      Result Value Range   Sodium 138  135 - 145 mEq/L   Potassium 4.1  3.5 - 5.1 mEq/L   Chloride 99  96 - 112 mEq/L   CO2 26  19 - 32 mEq/L   Glucose, Bld 235 (*) 70 - 99 mg/dL   BUN 30 (*) 6 - 23 mg/dL   Creatinine, Ser 1.29 (*) 0.50 - 1.10 mg/dL   Calcium 8.9  8.4 - 10.5 mg/dL   Total Protein 7.1  6.0 - 8.3 g/dL   Albumin 3.1 (*) 3.5 - 5.2 g/dL   AST 17  0 - 37 U/L   ALT 13  0 - 35 U/L   Alkaline Phosphatase 72  39 - 117 U/L   Total Bilirubin 0.3  0.3 - 1.2 mg/dL   GFR calc non Af Amer 45 (*) >90 mL/min   GFR calc Af Amer 53 (*) >90 mL/min   Comment: (NOTE)     The eGFR has been calculated using the CKD EPI equation.     This calculation has not been validated in all clinical situations.     eGFR's persistently <90 mL/min signify possible Chronic Kidney     Disease.  PRO B NATRIURETIC PEPTIDE     Status: Abnormal   Collection Time    01/18/13  1:10 PM      Result Value Range   Pro B Natriuretic peptide (BNP) 2253.0 (*) 0 - 125 pg/mL  TROPONIN I     Status: None   Collection Time    01/18/13  1:10 PM      Result Value Range   Troponin I <0.30  <0.30 ng/mL   Comment:            Due to the release kinetics of cTnI,     a negative result within the first hours     of the onset of symptoms does not rule out     myocardial infarction with certainty.     If myocardial infarction is still suspected,  repeat the test at appropriate intervals.  GLUCOSE, CAPILLARY     Status: Abnormal   Collection Time    01/18/13  2:02 PM      Result Value Range   Glucose-Capillary 234 (*) 70 - 99 mg/dL   Comment 1 Documented in Chart     Comment 2 Notify RN    PROTIME-INR     Status: Abnormal   Collection Time    01/18/13  4:54 PM      Result  Value Range   Prothrombin Time 31.0 (*) 11.6 - 15.2 seconds   INR 3.12 (*) 0.00 - 1.49  CG4 I-STAT (LACTIC ACID)     Status: None   Collection Time    01/18/13  7:04 PM      Result Value Range   Lactic Acid, Venous 0.76  0.5 - 2.2 mmol/L  GLUCOSE, CAPILLARY     Status: Abnormal   Collection Time    01/18/13  9:16 PM      Result Value Range   Glucose-Capillary 165 (*) 70 - 99 mg/dL   Comment 1 Notify RN    GLUCOSE, CAPILLARY     Status: Abnormal   Collection Time    01/19/13 12:00 AM      Result Value Range   Glucose-Capillary 139 (*) 70 - 99 mg/dL  URINALYSIS, ROUTINE W REFLEX MICROSCOPIC     Status: Abnormal   Collection Time    01/19/13  1:29 AM      Result Value Range   Color, Urine YELLOW  YELLOW   APPearance CLEAR  CLEAR   Specific Gravity, Urine 1.020  1.005 - 1.030   pH 6.5  5.0 - 8.0   Glucose, UA NEGATIVE  NEGATIVE mg/dL   Hgb urine dipstick LARGE (*) NEGATIVE   Bilirubin Urine NEGATIVE  NEGATIVE   Ketones, ur NEGATIVE  NEGATIVE mg/dL   Protein, ur 100 (*) NEGATIVE mg/dL   Urobilinogen, UA 1.0  0.0 - 1.0 mg/dL   Nitrite NEGATIVE  NEGATIVE   Leukocytes, UA NEGATIVE  NEGATIVE  URINE MICROSCOPIC-ADD ON     Status: None   Collection Time    01/19/13  1:29 AM      Result Value Range   WBC, UA 0-2  <3 WBC/hpf   RBC / HPF 11-20  <3 RBC/hpf   Bacteria, UA RARE  RARE  GLUCOSE, CAPILLARY     Status: Abnormal   Collection Time    01/19/13  3:47 AM      Result Value Range   Glucose-Capillary 123 (*) 70 - 99 mg/dL   Comment 1 Notify RN    BASIC METABOLIC PANEL     Status: Abnormal   Collection Time    01/19/13  4:16 AM      Result Value Range   Sodium 138  135 - 145 mEq/L   Potassium 4.4  3.5 - 5.1 mEq/L   Comment: HEMOLYSIS AT THIS LEVEL MAY AFFECT RESULT   Chloride 102  96 - 112 mEq/L   CO2 27  19 - 32 mEq/L   Glucose, Bld 127 (*) 70 - 99 mg/dL   BUN 26 (*) 6 - 23 mg/dL   Creatinine, Ser 1.06  0.50 - 1.10 mg/dL   Calcium 8.4  8.4 - 10.5 mg/dL   GFR calc non  Af Amer 58 (*) >90 mL/min   GFR calc Af Amer 67 (*) >90 mL/min   Comment: (NOTE)     The eGFR has been calculated using the  CKD EPI equation.     This calculation has not been validated in all clinical situations.     eGFR's persistently <90 mL/min signify possible Chronic Kidney     Disease.  PROTIME-INR     Status: Abnormal   Collection Time    01/19/13  4:16 AM      Result Value Range   Prothrombin Time 22.6 (*) 11.6 - 15.2 seconds   INR 2.06 (*) 0.00 - 1.49  CBC     Status: Abnormal   Collection Time    01/19/13  6:50 AM      Result Value Range   WBC 4.4  4.0 - 10.5 K/uL   RBC 3.25 (*) 3.87 - 5.11 MIL/uL   Hemoglobin 9.4 (*) 12.0 - 15.0 g/dL   HCT 29.2 (*) 36.0 - 46.0 %   MCV 89.8  78.0 - 100.0 fL   MCH 28.9  26.0 - 34.0 pg   MCHC 32.2  30.0 - 36.0 g/dL   RDW 14.3  11.5 - 15.5 %   Platelets 174  150 - 400 K/uL  GLUCOSE, CAPILLARY     Status: Abnormal   Collection Time    01/19/13  7:25 AM      Result Value Range   Glucose-Capillary 118 (*) 70 - 99 mg/dL   Comment 1 Documented in Chart    GLUCOSE, CAPILLARY     Status: Abnormal   Collection Time    01/19/13 11:26 AM      Result Value Range   Glucose-Capillary 187 (*) 70 - 99 mg/dL   Comment 1 Documented in Chart     Dg Chest 2 View  01/18/2013   CLINICAL DATA:  Weakness and cough.  EXAM: CHEST  2 VIEW  COMPARISON:  CT chest 03/26/2012 and PA and lateral chest 05/03/2012 and 09/08/2012.  FINDINGS: Heart size is mildly enlarged. Lungs are clear. No edema. No pneumothorax or pleural fluid. No focal bony abnormality.  IMPRESSION: No acute disease.   Electronically Signed   By: Inge Rise M.D.   On: 01/18/2013 13:34   Ct Abdomen Pelvis W Contrast  01/18/2013   CLINICAL DATA:  Rectal bleeding.  EXAM: CT ABDOMEN AND PELVIS WITH CONTRAST  TECHNIQUE: Multidetector CT imaging of the abdomen and pelvis was performed using the standard protocol following bolus administration of intravenous contrast.  CONTRAST:  102mL  OMNIPAQUE IOHEXOL 300 MG/ML  SOLN  COMPARISON:  08/17/2011.  FINDINGS: Scattered colonic diverticula. No extraluminal bowel inflammatory process, free fluid or free air.  Inferior vena cava filter is in place. Interval development of varices along the anterior abdominal wall. This may reflect decrease flow or thrombosis below the inferior vena cava filter site.  Minimal lobularity of the liver without definitive findings of cirrhosis. Liver is enlarged spanning over 21.8 cm. No focal hepatic lesion. Post cholecystectomy.  No focal splenic, pancreatic, adrenal or renal lesion.  Heart is enlarged with small pericardial effusion. Prominent mitral valve calcification and mild aortic valve calcification. Coronary artery calcification.  Atherosclerotic type changes of the aorta are without aneurysmal dilation. Mild narrowing of the celiac artery and superior mesenteric artery origin. Atherosclerotic type changes of the renal arteries and iliac arteries as well as femoral arteries. Narrowing without occlusion. No aneurysm.  Scoliosis and degenerative changes most prominent L4-5. Various degrees of spinal stenosis throughout the lower thoracic and lumbar spine.  Visualized lung bases are clear.  Very small hiatal hernia may be present.  No adenopathy.  Bilateral mild hip joint  degenerative changes and mild sacroiliac joint degenerative changes.  IMPRESSION: Scattered colonic diverticula. No extraluminal bowel inflammatory process, free fluid or free air.  Inferior vena cava filter is in place. Interval development of varices along the anterior abdominal wall. This may reflect decrease flow or thrombosis below the inferior vena cava filter site.  Minimal lobularity of the liver without definitive findings of cirrhosis. Liver is enlarged spanning over 21.8 cm.  Post cholecystectomy.  Heart is enlarged with small pericardial effusion.  Coronary artery calcification.  Atherosclerotic type changes of the aorta are without  aneurysmal dilation. Mild narrowing of the celiac artery and superior mesenteric artery origin. Atherosclerotic type changes of the renal arteries and iliac arteries as well as femoral arteries. Narrowing without occlusion. No aneurysm.  Scoliosis and degenerative changes most prominent L4-5. Various degrees of spinal stenosis throughout the lower thoracic and lumbar spine.   Electronically Signed   By: Chauncey Cruel M.D.   On: 01/18/2013 16:37   Review of Systems  Constitutional: Positive for malaise/fatigue. Negative for fever, chills, weight loss and diaphoresis.  Eyes: Negative.   Respiratory: Negative.   Gastrointestinal: Positive for constipation, blood in stool and melena. Negative for heartburn, nausea, vomiting, abdominal pain and diarrhea.  Genitourinary: Negative.   Musculoskeletal: Positive for back pain and joint pain.  Skin: Negative.   Neurological: Positive for weakness.   Blood pressure 140/58, pulse 66, temperature 97.7 F (36.5 C), temperature source Oral, resp. rate 18, height 5\' 3"  (1.6 m), weight 125.011 kg (275 lb 9.6 oz), SpO2 96.00%. Physical Exam  Constitutional: She is oriented to person, place, and time. She appears well-developed and well-nourished.  HENT:  Head: Normocephalic and atraumatic.  Eyes: Conjunctivae and EOM are normal. Pupils are equal, round, and reactive to light.  Neck: Normal range of motion. Neck supple.  Cardiovascular: Normal rate and regular rhythm.   Respiratory: Effort normal and breath sounds normal.  GI: Soft. Bowel sounds are normal. She exhibits no distension and no mass. There is no tenderness. There is no rebound and no guarding.  Neurological: She is alert and oriented to person, place, and time.  Skin: Skin is warm and dry.  Psychiatric: She has a normal mood and affect. Her behavior is normal. Judgment and thought content normal.   Assessment/Plan: 1) Rectal bleeding/LLQ pain/colonic diverticulosis: currently stable. Continue to  monitor closely for now. Will plan a colonoscopy once her INR is below 1.5.   2) Morbid obesity with BMI of 48 and sleep apnea.  3) Hyperlipidemia. 4) History of DVT and recurrent PE's: Coumadin is currently on hold.  5) Pulmonary HTN.  6) Diastolic dysfunction. 7) Chronic renal insufficiency.  [name] 01/19/2013, 5:33 PM

## 2013-01-19 NOTE — Progress Notes (Signed)
[facility] PROGRESS NOTE  Assessment/Plan:  Anticoagulated on Coumadin, chronically/ Rectal bleeding/ acute blood loss anemia:  - Vit K given, INR decreasing. - mild drop in Hbg. - CBC in am, type and cross, no further bleeding in house. - consult Gi for possible colonoscopy.   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 - plus SSI.  HTN (hypertension) - stable monitor.  Presence of IVC filter, placed June 2011 after 3d PE - INR thx.    Code Status: full Family Communication: none  Disposition Plan: inpatient   Consultants:  GI: Dr. Collene Mares  Procedures:  none  Antibiotics:  none (  HPI/Subjective: No complains, no further bleeding.  Objective: Filed Vitals:   01/18/13 2000 01/18/13 2100 01/19/13 0348 01/19/13 1039  BP: 180/69 171/80 137/71 141/77  Pulse: 64 63 82 65  Temp:  97.8 F (36.6 C) 97.8 F (36.6 C)   TempSrc:   Oral   Resp: 21 20 18    Height:  5\' 3"  (1.6 m)    Weight:  125.011 kg (275 lb 9.6 oz)    SpO2: 96% 96% 97%    No intake or output data in the 24 hours ending 01/19/13 1244 Filed Weights   01/18/13 1318 01/18/13 2100  Weight: 125.193 kg (276 lb) 125.011 kg (275 lb 9.6 oz)    Exam:  General: Alert, awake, oriented x3, in no acute distress.  HEENT: No bruits, no goiter.  Heart: Regular rate and rhythm, without murmurs, rubs, gallops.  Lungs: Good air movement, clear to auscultation. Abdomen: Soft, nontender, nondistended, positive bowel sounds.  Neuro: Grossly intact, nonfocal.   Data Reviewed: Basic Metabolic Panel:  Recent Labs Lab 01/18/13 1310 01/19/13 0416  NA 138 138  K 4.1 4.4  CL 99 102  CO2 26 27  GLUCOSE 235* 127*  BUN 30* 26*  CREATININE 1.29* 1.06  CALCIUM 8.9 8.4   Liver Function Tests:  Recent Labs Lab 01/18/13 1310  AST 17  ALT 13  ALKPHOS 72  BILITOT 0.3  PROT 7.1  ALBUMIN 3.1*   No results found for this basename: LIPASE, AMYLASE,  in the last  168 hours No results found for this basename: AMMONIA,  in the last 168 hours CBC:  Recent Labs Lab 01/18/13 1310 01/19/13 0650  WBC 6.2 4.4  NEUTROABS 4.9  --   HGB 10.7* 9.4*  HCT 33.3* 29.2*  MCV 90.2 89.8  PLT 193 174   Cardiac Enzymes:  Recent Labs Lab 01/18/13 1310  TROPONINI <0.30   BNP (last 3 results)  Recent Labs  01/25/12 2241 05/03/12 1435 01/18/13 1310  PROBNP 684.9* 1589.0* 2253.0*   CBG:  Recent Labs Lab 01/18/13 2116 01/19/13 01/19/13 0347 01/19/13 0725 01/19/13 1126  GLUCAP 165* 139* 123* 118* 187*    No results found for this or any previous visit (from the past 240 hour(s)).   Studies: Dg Chest 2 View  01/18/2013   CLINICAL DATA:  Weakness and cough.  EXAM: CHEST  2 VIEW  COMPARISON:  CT chest 03/26/2012 and PA and lateral chest 05/03/2012 and 09/08/2012.  FINDINGS: Heart size is mildly enlarged. Lungs are clear. No edema. No pneumothorax or pleural fluid. No focal bony abnormality.  IMPRESSION: No acute disease.   Electronically Signed   By: Inge Rise M.D.   On: 01/18/2013 13:34   Ct Abdomen Pelvis W Contrast  01/18/2013   CLINICAL DATA:  Rectal bleeding.  EXAM: CT ABDOMEN AND PELVIS WITH CONTRAST  TECHNIQUE:  Multidetector CT imaging of the abdomen and pelvis was performed using the standard protocol following bolus administration of intravenous contrast.  CONTRAST:  64mL OMNIPAQUE IOHEXOL 300 MG/ML  SOLN  COMPARISON:  08/17/2011.  FINDINGS: Scattered colonic diverticula. No extraluminal bowel inflammatory process, free fluid or free air.  Inferior vena cava filter is in place. Interval development of varices along the anterior abdominal wall. This may reflect decrease flow or thrombosis below the inferior vena cava filter site.  Minimal lobularity of the liver without definitive findings of cirrhosis. Liver is enlarged spanning over 21.8 cm. No focal hepatic lesion. Post cholecystectomy.  No focal splenic, pancreatic, adrenal or renal  lesion.  Heart is enlarged with small pericardial effusion. Prominent mitral valve calcification and mild aortic valve calcification. Coronary artery calcification.  Atherosclerotic type changes of the aorta are without aneurysmal dilation. Mild narrowing of the celiac artery and superior mesenteric artery origin. Atherosclerotic type changes of the renal arteries and iliac arteries as well as femoral arteries. Narrowing without occlusion. No aneurysm.  Scoliosis and degenerative changes most prominent L4-5. Various degrees of spinal stenosis throughout the lower thoracic and lumbar spine.  Visualized lung bases are clear.  Very small hiatal hernia may be present.  No adenopathy.  Bilateral mild hip joint degenerative changes and mild sacroiliac joint degenerative changes.  IMPRESSION: Scattered colonic diverticula. No extraluminal bowel inflammatory process, free fluid or free air.  Inferior vena cava filter is in place. Interval development of varices along the anterior abdominal wall. This may reflect decrease flow or thrombosis below the inferior vena cava filter site.  Minimal lobularity of the liver without definitive findings of cirrhosis. Liver is enlarged spanning over 21.8 cm.  Post cholecystectomy.  Heart is enlarged with small pericardial effusion.  Coronary artery calcification.  Atherosclerotic type changes of the aorta are without aneurysmal dilation. Mild narrowing of the celiac artery and superior mesenteric artery origin. Atherosclerotic type changes of the renal arteries and iliac arteries as well as femoral arteries. Narrowing without occlusion. No aneurysm.  Scoliosis and degenerative changes most prominent L4-5. Various degrees of spinal stenosis throughout the lower thoracic and lumbar spine.   Electronically Signed   By: Chauncey Cruel M.D.   On: 01/18/2013 16:37    Scheduled Meds: . citalopram  20 mg Oral QHS  . ferrous gluconate  324 mg Oral BID WC  . gabapentin  300 mg Oral BID  .  insulin aspart  0-15 Units Subcutaneous Q4H  . insulin glargine  10 Units Subcutaneous QHS  . irbesartan  300 mg Oral Daily  . isosorbide mononitrate  30 mg Oral Daily  . levothyroxine  25 mcg Oral QAC breakfast  . methocarbamol  500 mg Oral BID  . metoprolol  50 mg Oral BID  . multivitamin with minerals  1 tablet Oral Daily  . pantoprazole  40 mg Oral Q1200  . sodium chloride  3 mL Intravenous Q12H   Continuous Infusions:    Charlynne Cousins  [facility] Pager [phone]. If 8PM-8AM, please contact night-coverage at [email or web address], password [facility] 01/19/2013, 12:44 PM  LOS: 1 day

## 2013-01-20 LAB — URINE CULTURE: Colony Count: 4000

## 2013-01-20 LAB — PROTIME-INR
INR: 1.71 — ABNORMAL HIGH (ref 0.00–1.49)
Prothrombin Time: 19.6 seconds — ABNORMAL HIGH (ref 11.6–15.2)

## 2013-01-20 LAB — GLUCOSE, CAPILLARY
Glucose-Capillary: 105 mg/dL — ABNORMAL HIGH (ref 70–99)
Glucose-Capillary: 150 mg/dL — ABNORMAL HIGH (ref 70–99)
Glucose-Capillary: 73 mg/dL (ref 70–99)

## 2013-01-20 MED ORDER — SODIUM CHLORIDE 0.9 % IV SOLN
INTRAVENOUS | Status: DC
  Administered 2013-01-20: 18:00:00 via INTRAVENOUS

## 2013-01-20 MED ORDER — ONDANSETRON HCL 4 MG/2ML IJ SOLN: 4.0000 mg | INTRAMUSCULAR | Status: DC | PRN

## 2013-01-20 MED ORDER — PEG 3350-KCL-NA BICARB-NACL 420 G PO SOLR
4000.0000 mL | Freq: Once | ORAL | Status: AC
  Administered 2013-01-20: 4000 mL via ORAL
  Filled 2013-01-20: qty 4000

## 2013-01-20 MED ORDER — ONDANSETRON HCL 4 MG/2ML IJ SOLN: 4.0000 mg | Freq: Once | INTRAMUSCULAR | Status: DC

## 2013-01-20 NOTE — Care Management Note (Unsigned)
    Page 1 of 1   01/20/2013     10:31:36 AM   CARE MANAGEMENT NOTE 01/20/2013  Patient:  [name], [name]   Account Number:  [id number]  Date Initiated:  01/20/2013  Documentation initiated by:  GRAVES-BIGELOW,Ahaan Zobrist  Subjective/Objective Assessment:   Pt admitted for CP. Troponins increased- Nstemi. Plan cath today 01-20-13.     Action/Plan:   CM will continue to monitor for disposition needs.   Anticipated DC Date:  01/22/2013   Anticipated DC Plan:  [place]  CM consult      Choice offered to / List presented to:             Status of service:  In process, will continue to follow Medicare Important Message given?   (If response is "NO", the following Medicare IM given date fields will be blank) Date Medicare IM given:   Date Additional Medicare IM given:    Discharge Disposition:    Per UR Regulation:  Reviewed for med. necessity/level of care/duration of stay  If discussed at [place] of Stay Meetings, dates discussed:    Comments:

## 2013-01-20 NOTE — Progress Notes (Signed)
Chart reviewed.  [facility] PROGRESS NOTE  Assessment/Plan:  Anticoagulated on Coumadin, chronically/ Rectal bleeding/ acute blood loss anemia:  None further.  Read [name] note.  I was about to discharge patient, when he called back  Recommending inpatient colonoscopy tomorrow.   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 - plus SSI.  HTN (hypertension) - stable monitor.  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  Coumadin reversed.    Code Status: full Family Communication: none  Disposition Plan: inpatient   Consultants:  GI: Dr. Collene Mares  Procedures:  none  Antibiotics:  none   HPI/Subjective: No complains, no further bleeding.  Objective: Filed Vitals:   01/19/13 2006 01/19/13 2311 01/20/13 0623 01/20/13 1332  BP: 171/58 134/53 133/73 152/70  Pulse: 62 56 60 64  Temp: 98.2 F (36.8 C) 98.6 F (37 C) 97.5 F (36.4 C) 97.4 F (36.3 C)  TempSrc: Oral Oral Oral Oral  Resp: 18  18 16   Height:      Weight:      SpO2: 95% 95% 92% 96%    Intake/Output Summary (Last 24 hours) at 01/20/13 1827 Last data filed at 01/20/13 1300  Gross per 24 hour  Intake    600 ml  Output      0 ml  Net    600 ml   Filed Weights   01/18/13 1318 01/18/13 2100  Weight: 125.193 kg (276 lb) 125.011 kg (275 lb 9.6 oz)    Exam:  General: Alert, awake, oriented x3, in no acute distress.  HEENT: No bruits, no goiter.  Heart: Regular rate and rhythm, without murmurs, rubs, gallops.  Lungs: Good air movement, clear to auscultation. Abdomen: Soft, nontender, nondistended, positive bowel sounds.  Neuro: Grossly intact, nonfocal.   Data Reviewed: Basic Metabolic Panel:  Recent Labs Lab 01/18/13 1310 01/19/13 0416  NA 138 138  K 4.1 4.4  CL 99 102  CO2 26 27  GLUCOSE 235* 127*  BUN 30* 26*  CREATININE 1.29* 1.06  CALCIUM 8.9 8.4   Liver Function Tests:  Recent Labs Lab 01/18/13 1310  AST  17  ALT 13  ALKPHOS 72  BILITOT 0.3  PROT 7.1  ALBUMIN 3.1*   No results found for this basename: LIPASE, AMYLASE,  in the last 168 hours No results found for this basename: AMMONIA,  in the last 168 hours CBC:  Recent Labs Lab 01/18/13 1310 01/19/13 0650  WBC 6.2 4.4  NEUTROABS 4.9  --   HGB 10.7* 9.4*  HCT 33.3* 29.2*  MCV 90.2 89.8  PLT 193 174   Cardiac Enzymes:  Recent Labs Lab 01/18/13 1310  TROPONINI <0.30   BNP (last 3 results)  Recent Labs  01/25/12 2241 05/03/12 1435 01/18/13 1310  PROBNP 684.9* 1589.0* 2253.0*   CBG:  Recent Labs Lab 01/19/13 1635 01/19/13 2003 01/20/13 0743 01/20/13 1139 01/20/13 1705  GLUCAP 129* 100* 105* 150* 139*    Recent Results (from the past 240 hour(s))  URINE CULTURE     Status: None   Collection Time    01/19/13  1:29 AM      Result Value Range Status   Specimen Description URINE, CLEAN CATCH   Final   Special Requests NONE   Final   Culture  Setup Time     Final   Value: 01/19/2013 02:14     Performed at [place]     Final  Value: 4,000 COLONIES/ML     Performed at [facility]     Final   Value: INSIGNIFICANT GROWTH     Performed at [facility]   Report Status 01/20/2013 FINAL   Final     Studies: No results found.  Scheduled Meds: . citalopram  20 mg Oral QHS  . gabapentin  300 mg Oral BID  . insulin aspart  0-20 Units Subcutaneous TID WC  . insulin aspart  0-5 Units Subcutaneous QHS  . insulin aspart  6 Units Subcutaneous TID WC  . insulin glargine  23 Units Subcutaneous QHS  . irbesartan  300 mg Oral Daily  . isosorbide mononitrate  30 mg Oral Daily  . levothyroxine  25 mcg Oral QAC breakfast  . methocarbamol  500 mg Oral BID  . metoprolol  50 mg Oral BID  . pantoprazole  40 mg Oral Q1200  . sodium chloride  3 mL Intravenous Q12H   Continuous Infusions: . sodium chloride 20 mL/hr at 01/20/13 1736     Delfina Redwood,  MD [facility] Pager [phone]. If 8PM-8AM, please contact night-coverage at [email or web address], password [facility] 01/20/2013, 6:27 PM  LOS: 2 days

## 2013-01-20 NOTE — Progress Notes (Signed)
Subjective: No further hematochezia with a hard bowel movement today.  Objective: Vital signs in last 24 hours: Temp:  [97.5 F (36.4 C)-98.6 F (37 C)] 97.5 F (36.4 C) (10/16 0623) Pulse Rate:  [56-66] 60 (10/16 0623) Resp:  [18] 18 (10/16 0623) BP: (133-171)/(53-73) 133/73 mmHg (10/16 0623) SpO2:  [92 %-96 %] 92 % (10/16 0623) Last BM Date: 01/18/13  Intake/Output from previous day: 10/15 0701 - 10/16 0700 In: 840 [P.O.:840] Out: -  Intake/Output this shift: Total I/O In: 360 [P.O.:360] Out: -   General appearance: alert and no distress GI: soft, non-tender; bowel sounds normal; no masses,  no organomegaly and Rectal: brow stool  Lab Results:  Recent Labs  01/18/13 1310 01/19/13 0650  WBC 6.2 4.4  HGB 10.7* 9.4*  HCT 33.3* 29.2*  PLT 193 174   BMET  Recent Labs  01/18/13 1310 01/19/13 0416  NA 138 138  K 4.1 4.4  CL 99 102  CO2 26 27  GLUCOSE 235* 127*  BUN 30* 26*  CREATININE 1.29* 1.06  CALCIUM 8.9 8.4   LFT  Recent Labs  01/18/13 1310  PROT 7.1  ALBUMIN 3.1*  AST 17  ALT 13  ALKPHOS 72  BILITOT 0.3   PT/INR  Recent Labs  01/19/13 0416 01/20/13 0345  LABPROT 22.6* 19.6*  INR 2.06* 1.71*   Hepatitis Panel No results found for this basename: HEPBSAG, HCVAB, HEPAIGM, HEPBIGM,  in the last 72 hours C-Diff No results found for this basename: CDIFFTOX,  in the last 72 hours Fecal Lactopherrin No results found for this basename: FECLLACTOFRN,  in the last 72 hours  Studies/Results: Dg Chest 2 View  01/18/2013   CLINICAL DATA:  Weakness and cough.  EXAM: CHEST  2 VIEW  COMPARISON:  CT chest 03/26/2012 and PA and lateral chest 05/03/2012 and 09/08/2012.  FINDINGS: Heart size is mildly enlarged. Lungs are clear. No edema. No pneumothorax or pleural fluid. No focal bony abnormality.  IMPRESSION: No acute disease.   Electronically Signed   By: Inge Rise M.D.   On: 01/18/2013 13:34   Ct Abdomen Pelvis W Contrast  01/18/2013    CLINICAL DATA:  Rectal bleeding.  EXAM: CT ABDOMEN AND PELVIS WITH CONTRAST  TECHNIQUE: Multidetector CT imaging of the abdomen and pelvis was performed using the standard protocol following bolus administration of intravenous contrast.  CONTRAST:  39mL OMNIPAQUE IOHEXOL 300 MG/ML  SOLN  COMPARISON:  08/17/2011.  FINDINGS: Scattered colonic diverticula. No extraluminal bowel inflammatory process, free fluid or free air.  Inferior vena cava filter is in place. Interval development of varices along the anterior abdominal wall. This may reflect decrease flow or thrombosis below the inferior vena cava filter site.  Minimal lobularity of the liver without definitive findings of cirrhosis. Liver is enlarged spanning over 21.8 cm. No focal hepatic lesion. Post cholecystectomy.  No focal splenic, pancreatic, adrenal or renal lesion.  Heart is enlarged with small pericardial effusion. Prominent mitral valve calcification and mild aortic valve calcification. Coronary artery calcification.  Atherosclerotic type changes of the aorta are without aneurysmal dilation. Mild narrowing of the celiac artery and superior mesenteric artery origin. Atherosclerotic type changes of the renal arteries and iliac arteries as well as femoral arteries. Narrowing without occlusion. No aneurysm.  Scoliosis and degenerative changes most prominent L4-5. Various degrees of spinal stenosis throughout the lower thoracic and lumbar spine.  Visualized lung bases are clear.  Very small hiatal hernia may be present.  No adenopathy.  Bilateral mild hip  joint degenerative changes and mild sacroiliac joint degenerative changes.  IMPRESSION: Scattered colonic diverticula. No extraluminal bowel inflammatory process, free fluid or free air.  Inferior vena cava filter is in place. Interval development of varices along the anterior abdominal wall. This may reflect decrease flow or thrombosis below the inferior vena cava filter site.  Minimal lobularity of the  liver without definitive findings of cirrhosis. Liver is enlarged spanning over 21.8 cm.  Post cholecystectomy.  Heart is enlarged with small pericardial effusion.  Coronary artery calcification.  Atherosclerotic type changes of the aorta are without aneurysmal dilation. Mild narrowing of the celiac artery and superior mesenteric artery origin. Atherosclerotic type changes of the renal arteries and iliac arteries as well as femoral arteries. Narrowing without occlusion. No aneurysm.  Scoliosis and degenerative changes most prominent L4-5. Various degrees of spinal stenosis throughout the lower thoracic and lumbar spine.   Electronically Signed   By: Chauncey Cruel M.D.   On: 01/18/2013 16:37    Medications:  Scheduled: . citalopram  20 mg Oral QHS  . gabapentin  300 mg Oral BID  . insulin aspart  0-20 Units Subcutaneous TID WC  . insulin aspart  0-5 Units Subcutaneous QHS  . insulin aspart  6 Units Subcutaneous TID WC  . insulin glargine  23 Units Subcutaneous QHS  . irbesartan  300 mg Oral Daily  . isosorbide mononitrate  30 mg Oral Daily  . levothyroxine  25 mcg Oral QAC breakfast  . methocarbamol  500 mg Oral BID  . metoprolol  50 mg Oral BID  . pantoprazole  40 mg Oral Q1200  . sodium chloride  3 mL Intravenous Q12H   Continuous:   Assessment/Plan: 1) Hematochezia. 2) Multiple medical problems.   The patient is stable.  There is a mild drop in her HGB and she has a baseline HGB in the 8-9 range.  I offered to have her undergo a colonoscopy tomorrow, but she does not want to stay in the hospital.  She wants to have the procedure as an outpatient.  Since she is clinically stable, I think it is fine for her to follow as an outpatient.  Approximately one year ago she had a similar presentation of hematochezia.  Plan: 1) Outpatient colonoscopy with Dr. Collene Mares. 2) Signing off.   LOS: 2 days   Lacretia Tindall D 01/20/2013, 12:59 PM

## 2013-01-21 ENCOUNTER — Other Ambulatory Visit: Payer: Self-pay | Admitting: *Deleted

## 2013-01-21 ENCOUNTER — Encounter (HOSPITAL_COMMUNITY)
Admission: EM | Disposition: A | Discharge status: Home / Self Care | Payer: Self-pay | Source: Home / Self Care | Attending: Internal Medicine

## 2013-01-21 DIAGNOSIS — Z86711 Personal history of pulmonary embolism: Secondary | ICD-10-CM

## 2013-01-21 HISTORY — PX: COLONOSCOPY: SHX5424

## 2013-01-21 LAB — GLUCOSE, CAPILLARY: Glucose-Capillary: 91 mg/dL (ref 70–99)

## 2013-01-21 LAB — CBC
MCH: 29 pg (ref 26.0–34.0)
MCV: 90.1 fL (ref 78.0–100.0)
Platelets: 169 10*3/uL (ref 150–400)
RBC: 3.24 MIL/uL — ABNORMAL LOW (ref 3.87–5.11)
RDW: 14.2 % (ref 11.5–15.5)
WBC: 4.9 10*3/uL (ref 4.0–10.5)

## 2013-01-21 LAB — PROTIME-INR: Prothrombin Time: 17.9 seconds — ABNORMAL HIGH (ref 11.6–15.2)

## 2013-01-21 LAB — HEMOGLOBIN AND HEMATOCRIT, BLOOD: Hemoglobin: 10 g/dL — ABNORMAL LOW (ref 12.0–15.0)

## 2013-01-21 SURGERY — COLONOSCOPY
Anesthesia: Moderate Sedation

## 2013-01-21 MED ORDER — MIDAZOLAM HCL 5 MG/ML IJ SOLN
INTRAMUSCULAR | Status: AC
  Filled 2013-01-21: qty 2

## 2013-01-21 MED ORDER — DOCUSATE SODIUM 100 MG PO CAPS: 100.0000 mg | ORAL_CAPSULE | Freq: Two times a day (BID) | ORAL | Status: DC

## 2013-01-21 MED ORDER — FENTANYL CITRATE 0.05 MG/ML IJ SOLN
INTRAMUSCULAR | Status: DC | PRN
  Administered 2013-01-21 (×2): 25 ug via INTRAVENOUS

## 2013-01-21 MED ORDER — MIDAZOLAM HCL 5 MG/5ML IJ SOLN
INTRAMUSCULAR | Status: DC | PRN
  Administered 2013-01-21 (×3): 2 mg via INTRAVENOUS

## 2013-01-21 MED ORDER — FENTANYL CITRATE 0.05 MG/ML IJ SOLN
INTRAMUSCULAR | Status: AC
  Filled 2013-01-21: qty 2

## 2013-01-21 NOTE — Plan of Care (Signed)
Problem: Phase II Progression Outcomes Goal: No active bleeding Outcome: Not Progressing See progress notes.

## 2013-01-21 NOTE — Interval H&P Note (Signed)
History and Physical Interval Note:  01/21/2013 7:23 AM  [name]  has presented today for surgery, with the diagnosis of Hematochezia and melena  The various methods of treatment have been discussed with the patient and family. After consideration of risks, benefits and other options for treatment, the patient has consented to  Procedure(s): COLONOSCOPY (N/A) as a surgical intervention .  The patient's history has been reviewed, patient examined, no change in status, stable for surgery.  I have reviewed the patient's chart and labs.  Questions were answered to the patient's satisfaction.     Gaynelle Pastrana D

## 2013-01-21 NOTE — Progress Notes (Addendum)
Patient had two large, bright red, bloody stools with clots a few minutes apart. She states she has not noticed blood in her stools since Tuesday. She also stated that she was feeling mildly light headed.   Spoke with [name], NP on-call for [facility]. New orders rec'd for H+H now and CBC in AM. [name] requested that GI be informed also in case other orders are needed.  Paged [name] service.  Awaiting return call.  Addendum for 00:25: Spoke with [name] by telephone. He appreciates H+H monitoring orders per [name] and stated that he will move [name]'s scheduled colonoscopy up in AM.  Orders rec'd to make patient NPO now.  Continuing to monitor closely.

## 2013-01-21 NOTE — H&P (View-Only) (Signed)
Patient had two large, bright red, bloody stools with clots a few minutes apart. She states she has not noticed blood in her stools since Tuesday. She also stated that she was feeling mildly light headed.   Spoke with [name], NP on-call for [facility]. New orders rec'd for H+H now and CBC in AM. [name] requested that GI be informed also in case other orders are needed.  Paged [name] service.  Awaiting return call.  Addendum for 00:25: Spoke with [name] by telephone. He appreciates H+H monitoring orders per [name] and stated that he will move Mrs. Dang's scheduled colonoscopy up in AM.  Orders rec'd to make patient NPO now.  Continuing to monitor closely.

## 2013-01-21 NOTE — Op Note (Addendum)
[facility] [place], [id number]   COLONOSCOPY PROCEDURE REPORT  PATIENT: [name], [name]  MR#: [id number] BIRTHDATE: 1955/08/04 , 56  yrs. old GENDER: Female ENDOSCOPIST: Carol Ada, MD REFERRED BY: PROCEDURE DATE:  01/21/2013 PROCEDURE:   Colonoscopy, diagnostic ASA CLASS:   Class IV INDICATIONS:hematochezia. MEDICATIONS: Versed 6 mg IV and Fentanyl 50 mcg IV  DESCRIPTION OF PROCEDURE:   After the risks benefits and alternatives of the procedure were thoroughly explained, informed consent was obtained.  A digital rectal exam revealed hemorrhoids. The [employer] Adult Colon [id number]  endoscope was introduced through the anus and advanced to the terminal ileum which was intubated for a short distance. No adverse events experienced.   The quality of the prep was good.  The instrument was then slowly withdrawn as the colon was fully examined.    FINDINGS: Upon initial entry into the rectum there was evidence of fresh blood, but no evidence of active bleeding.  The blood trace extended up into the sigmoid colon.  No evidence of blood proximal to this point.  No pathology was identified proximial to this point.  Scant evidence of sigmoid diverticula were found.  Close inspection of the anus and hemorrhoids was performed and there is a suspicious site of bleeding.  There is a small just above and adjacent to the dentate line that was felt to be the source of bleeding.  No evidence of any masses, inflammation, ulcerations, erosions, or vascular abnormalities.     The time to cecum=  . Withdrawal time=  .  The scope was withdrawn and the procedure completed. COMPLICATIONS: There were no complications.  ENDOSCOPIC IMPRESSION: 1) Sigmoid diverticula. 2) Int/Ext hemorrhoids - Most likely the source of bleeding.  RECOMMENDATIONS: 1) High fiber diet to keep the stools soft. 2) Follow up with Dr. Collene Mares in 4 weeks.  eSigned:  Carol Ada, MD  01/21/2013 8:23 AM   cc:

## 2013-01-21 NOTE — Discharge Summary (Signed)
Physician Discharge Summary  [name] [id number] DOB: 02/26/1956 DOA: 01/18/2013  PCP: [name], MD  Admit date: 01/18/2013 Discharge date: 01/21/2013  Time spent: greater than 30 min  Recommendations for Outpatient Follow-up:  1. 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 female who presents with c/o bright red blood per rectum after a BM earlier this afternoon. The patient is on coumadin chronically for multiple thrombotic events previously (3 PEs, numerous DVTs, etc). She has previously had an episode of rectal bleeding a year ago due to a rectal fissure, but colonoscopy didn't reveal any other source of bleeding. In the ED she was given Vit K  Hospital Course:   Admitted to the hospitalist service. GI consulted. After INR decreased to 1.5, she had a colonoscopy which showed scattered sigmoid diverticula without signs of bleeding. Internal and external hemorrhoids with an area suspicious for recent bleed, but no ongoing bleeding. Hemoglobin dropped but patient did not require transfusion of red blood cells, nor FFP. Other medical issues remained stable. She may resume anticoagulation  Procedures:  Colonoscopy by [name] on 01/21/2013 Upon initial entry into the rectum there was evidence of  fresh blood, but no evidence of active bleeding. The blood trace  extended up into the sigmoid colon. No evidence of blood proximal  to this point. No pathology was identified proximial to this  point. Scant evidence of  sigmoid diverticula were found. Close  inspection of the anus and hemorrhoids was performed and there is a  suspicious site of bleeding. There is a small just above and  adjacent to the dentate line that was felt to be the source of  bleeding. No evidence of any masses, inflammation, ulcerations,  erosions, or vascular abnormalities. The time to cecum= .  Withdrawal time= . The scope was withdrawn and the procedure  completed.  COMPLICATIONS: There were no complications.  ENDOSCOPIC IMPRESSION:  1) Sigmoid diverticula.  2) Int/Ext hemorrhoids - Most likely the source of bleeding.  RECOMMENDATIONS:  1) High fiber diet to keep the stools soft.  2) Follow up with Dr. Collene Mares in 4 weeks.   Consultations:  GI  Discharge Exam: Filed Vitals:   01/21/13 0923  BP: 184/77  Pulse: 52  Temp:   Resp:     General: alert, oriented and comfortable Cardiovascular: rrr without MGR Respiratory: CTA without WRR Abd: s, nt, nd Ext: no CCE  Discharge Instructions  Discharge Orders   Future Appointments Provider Department Dept Phone   02/23/2013 10:00 AM [facility] [phone]   02/23/2013 10:30 AM [facility] Provider [facility] [phone]   Future Orders Complete By Expires   Activity as tolerated - No restrictions  As directed    Diet - low sodium heart healthy  As directed    Diet Carb Modified  As directed    Scheduling Instructions:     HIGH FIBER   Discharge patient  As directed  Medication List    STOP taking these medications       ibuprofen 400 MG tablet  Commonly known as:  ADVIL,MOTRIN      TAKE these medications       aspirin EC 81 MG tablet  Take 81 mg by mouth daily.     cholecalciferol 1000 UNITS tablet  Commonly known as:  VITAMIN D  Take 2,000 Units by mouth daily.     citalopram 20 MG tablet  Commonly known as:  CELEXA  Take 20 mg by mouth at bedtime.      diazepam 5 MG tablet  Commonly known as:  VALIUM  Take 1 tablet (5 mg total) by mouth at bedtime as needed for anxiety or sleep (muscle spasms). For muscle spasms.     docusate sodium 100 MG capsule  Commonly known as:  COLACE  Take 1 capsule (100 mg total) by mouth 2 (two) times daily.     ferrous gluconate 324 MG tablet  Commonly known as:  FERGON  Take 1 tablet (324 mg total) by mouth 2 (two) times daily with a meal.     Fish Oil 1200 MG Caps  Take 1 capsule by mouth 2 (two) times daily.     furosemide 40 MG tablet  Commonly known as:  LASIX  Take 40 mg by mouth daily.     gabapentin 300 MG capsule  Commonly known as:  NEURONTIN  Take 1 capsule (300 mg total) by mouth 2 (two) times daily.     HYDROcodone-acetaminophen 5-325 MG per tablet  Commonly known as:  NORCO/VICODIN  Take 1 tablet by mouth every 6 (six) hours as needed for pain.     insulin aspart 100 UNIT/ML injection  Commonly known as:  novoLOG  Inject 10 Units into the skin 3 (three) times daily with meals.     insulin glargine 100 UNIT/ML injection  Commonly known as:  LANTUS  Inject 23 Units into the skin at bedtime.     isosorbide mononitrate 30 MG 24 hr tablet  Commonly known as:  IMDUR  Take 1 tablet (30 mg total) by mouth daily.     levothyroxine 25 MCG tablet  Commonly known as:  SYNTHROID, LEVOTHROID  Take 25 mcg by mouth daily.     methocarbamol 500 MG tablet  Commonly known as:  ROBAXIN  Take 1 tablet (500 mg total) by mouth 2 (two) times daily.     metoprolol 50 MG tablet  Commonly known as:  LOPRESSOR  Take 50 mg by mouth 2 (two) times daily.     multivitamin with minerals Tabs tablet  Take 1 tablet by mouth daily.     MUSCLE RUB 10-15 % Crea  Apply 1 application topically as needed (for pain).     olmesartan 40 MG tablet  Commonly known as:  BENICAR  Take 40 mg by mouth daily.     pantoprazole 40 MG tablet  Commonly known as:  PROTONIX  Take 1 tablet (40 mg total) by mouth  daily at 12 noon.     warfarin 2.5 MG tablet  Commonly known as:  COUMADIN  Take 7.5-10 mg by mouth See admin instructions. 7.5mg   on Monday Wednesday Friday. All other day pt takes 10 mg. ( 4 tabs )       Allergies  Allergen Reactions  . Morphine And Related Rash       Follow-up Information   Schedule an appointment as soon as possible for a visit with MANN,JYOTHI,  MD.   Specialty:  Gastroenterology   Contact information:   [address] [place] [address] [phone]        The results of significant diagnostics from this hospitalization (including imaging, microbiology, ancillary and laboratory) are listed below for reference.    Significant Diagnostic Studies: Dg Chest 2 View  01/18/2013   CLINICAL DATA:  Weakness and cough.  EXAM: CHEST  2 VIEW  COMPARISON:  CT chest 03/26/2012 and PA and lateral chest 05/03/2012 and 09/08/2012.  FINDINGS: Heart size is mildly enlarged. Lungs are clear. No edema. No pneumothorax or pleural fluid. No focal bony abnormality.  IMPRESSION: No acute disease.   Electronically Signed   By: Inge Rise M.D.   On: 01/18/2013 13:34   Ct Abdomen Pelvis W Contrast  01/18/2013   CLINICAL DATA:  Rectal bleeding.  EXAM: CT ABDOMEN AND PELVIS WITH CONTRAST  TECHNIQUE: Multidetector CT imaging of the abdomen and pelvis was performed using the standard protocol following bolus administration of intravenous contrast.  CONTRAST:  87mL OMNIPAQUE IOHEXOL 300 MG/ML  SOLN  COMPARISON:  08/17/2011.  FINDINGS: Scattered colonic diverticula. No extraluminal bowel inflammatory process, free fluid or free air.  Inferior vena cava filter is in place. Interval development of varices along the anterior abdominal wall. This may reflect decrease flow or thrombosis below the inferior vena cava filter site.  Minimal lobularity of the liver without definitive findings of cirrhosis. Liver is enlarged spanning over 21.8 cm. No focal hepatic lesion. Post  cholecystectomy.  No focal splenic, pancreatic, adrenal or renal lesion.  Heart is enlarged with small pericardial effusion. Prominent mitral valve calcification and mild aortic valve calcification. Coronary artery calcification.  Atherosclerotic type changes of the aorta are without aneurysmal dilation. Mild narrowing of the celiac artery and superior mesenteric artery origin. Atherosclerotic type changes of the renal arteries and iliac arteries as well as femoral arteries. Narrowing without occlusion. No aneurysm.  Scoliosis and degenerative changes most prominent L4-5. Various degrees of spinal stenosis throughout the lower thoracic and lumbar spine.  Visualized lung bases are clear.  Very small hiatal hernia may be present.  No adenopathy.  Bilateral mild hip joint degenerative changes and mild sacroiliac joint degenerative changes.  IMPRESSION: Scattered colonic diverticula. No extraluminal bowel inflammatory process, free fluid or free air.  Inferior vena cava filter is in place. Interval development of varices along the anterior abdominal wall. This may reflect decrease flow or thrombosis below the inferior vena cava filter site.  Minimal lobularity of the liver without definitive findings of cirrhosis. Liver is enlarged spanning over 21.8 cm.  Post cholecystectomy.  Heart is enlarged with small pericardial effusion.  Coronary artery calcification.  Atherosclerotic type changes of the aorta are without aneurysmal dilation. Mild narrowing of the celiac artery and superior mesenteric artery origin. Atherosclerotic type changes of the renal arteries and iliac arteries as well as femoral arteries. Narrowing without occlusion. No aneurysm.  Scoliosis and degenerative changes most prominent L4-5. Various degrees of spinal stenosis throughout the lower thoracic and lumbar spine.   Electronically Signed   By: Chauncey Cruel M.D.   On: 01/18/2013 16:37    Microbiology: Recent Results (from the past 240 hour(s))   URINE CULTURE     Status: None   Collection Time    01/19/13  1:29 AM      Result Value Range Status   Specimen Description URINE, CLEAN CATCH   Final   Special Requests NONE   Final  Culture  Setup Time     Final   Value: 01/19/2013 02:14     Performed at [facility]     Final   Value: 4,000 COLONIES/ML     Performed at [facility]   Culture     Final   Value: INSIGNIFICANT GROWTH     Performed at [facility]   Report Status 01/20/2013 FINAL   Final     Labs: Basic Metabolic Panel:  Recent Labs Lab 01/18/13 1310 01/19/13 0416  NA 138 138  K 4.1 4.4  CL 99 102  CO2 26 27  GLUCOSE 235* 127*  BUN 30* 26*  CREATININE 1.29* 1.06  CALCIUM 8.9 8.4   Liver Function Tests:  Recent Labs Lab 01/18/13 1310  AST 17  ALT 13  ALKPHOS 72  BILITOT 0.3  PROT 7.1  ALBUMIN 3.1*   No results found for this basename: LIPASE, AMYLASE,  in the last 168 hours No results found for this basename: AMMONIA,  in the last 168 hours CBC:  Recent Labs Lab 01/18/13 1310 01/19/13 0650 01/21/13 0100 01/21/13 0515  WBC 6.2 4.4  --  4.9  NEUTROABS 4.9  --   --   --   HGB 10.7* 9.4* 10.0* 9.4*  HCT 33.3* 29.2* 30.9* 29.2*  MCV 90.2 89.8  --  90.1  PLT 193 174  --  169   Cardiac Enzymes:  Recent Labs Lab 01/18/13 1310  TROPONINI <0.30   BNP: BNP (last 3 results)  Recent Labs  01/25/12 2241 05/03/12 1435 01/18/13 1310  PROBNP 684.9* 1589.0* 2253.0*   CBG:  Recent Labs Lab 01/20/13 1139 01/20/13 1705 01/20/13 2123 01/21/13 0006 01/21/13 0423  GLUCAP 150* 139* 73 91 102*   Signed:  Quincee Gittens L  [facility] 01/21/2013, 9:58 AM

## 2013-01-21 NOTE — Progress Notes (Signed)
DC orders received.  Patient stable with no S/S of distress.  Medication and discharge information reviewed with patient.  Patient DC home. Moodus, Ardeth Sportsman

## 2013-01-24 ENCOUNTER — Encounter (HOSPITAL_COMMUNITY): Payer: Self-pay | Admitting: Gastroenterology

## 2013-01-25 ENCOUNTER — Telehealth: Payer: Self-pay | Admitting: Pharmacist

## 2013-01-26 ENCOUNTER — Telehealth: Payer: Self-pay | Admitting: Pharmacist

## 2013-01-26 NOTE — Telephone Encounter (Signed)
Pt returned my phone call & requested to be seen this Fri (01/28/13) for lab/Coumadin clinic. She restarted Coumadin on 01/21/13 upon discharge from the hospital.  She is back on Coumadin 10 mg daily except 7.5 mg MWF Her INR on admission to hospital 01/18/13 was 3.12.  Our goal is 2-2.2 (high risk for bleeding; retinal bleed in past).  We may need to adjust down her Coumadin dose at her next visit depending on INR. Kennith Center, Pharm.D., CPP 01/26/2013@11 :23 AM

## 2013-01-28 ENCOUNTER — Other Ambulatory Visit (HOSPITAL_BASED_OUTPATIENT_CLINIC_OR_DEPARTMENT_OTHER): Payer: Medicaid Other | Admitting: Lab

## 2013-01-28 ENCOUNTER — Ambulatory Visit (HOSPITAL_BASED_OUTPATIENT_CLINIC_OR_DEPARTMENT_OTHER): Payer: Medicaid Other | Admitting: Pharmacist

## 2013-01-28 DIAGNOSIS — I82409 Acute embolism and thrombosis of unspecified deep veins of unspecified lower extremity: Secondary | ICD-10-CM

## 2013-01-28 DIAGNOSIS — I82723 Chronic embolism and thrombosis of deep veins of upper extremity, bilateral: Secondary | ICD-10-CM

## 2013-01-28 LAB — POCT INR: INR: 2.2

## 2013-01-28 LAB — PROTIME-INR
INR: 2.2 (ref 2.00–3.50)
Protime: 26.4 Seconds — ABNORMAL HIGH (ref 10.6–13.4)

## 2013-01-28 NOTE — Progress Notes (Signed)
INR at goal today.  Pt is doing well after recent bleeding episode and hospital admission on 10/14.  Pt was discovered to have hemorrhoids that were causing the bleeding.  Pt's INR on admission was 3.12.  Coumadin was held in the hospital to prepare for colonoscopy.  Pt restarted her coumadin on 01/21/13 at her regular dose of 10 mg daily except for 5 mg on MWF.  Ms. Santell is doing well today with no complaints No complications regarding anticoagulation No missed or extra doses No diet or medication changes Plan to continue 4 tablets (10 mg) daily except 3 tablets (7.5 mg) on Mon/Wed/Fri. Recheck PT/INR in 2 weeks on 02/11/13; 10:30 am for lab and 10:45am for Coumadin clinic If INR starts trending up we can reduce her dose to include more days of 7.5 mg, but for now we will continue the same dose

## 2013-01-28 NOTE — Patient Instructions (Addendum)
INR at goal No Changes Continue 4 tablets daily except 3 tablets on Mon/Wed/Fri. Recheck PT/INR in 2 weeks on 02/11/13; 10:30 am for lab and 10:45 am for Coumadin clinic.

## 2013-02-11 ENCOUNTER — Ambulatory Visit (HOSPITAL_BASED_OUTPATIENT_CLINIC_OR_DEPARTMENT_OTHER): Payer: Medicaid Other | Admitting: Pharmacist

## 2013-02-11 ENCOUNTER — Other Ambulatory Visit (HOSPITAL_BASED_OUTPATIENT_CLINIC_OR_DEPARTMENT_OTHER): Payer: Medicaid Other | Admitting: Lab

## 2013-02-11 DIAGNOSIS — I82409 Acute embolism and thrombosis of unspecified deep veins of unspecified lower extremity: Secondary | ICD-10-CM

## 2013-02-11 DIAGNOSIS — I82723 Chronic embolism and thrombosis of deep veins of upper extremity, bilateral: Secondary | ICD-10-CM

## 2013-02-11 DIAGNOSIS — I82729 Chronic embolism and thrombosis of deep veins of unspecified upper extremity: Secondary | ICD-10-CM

## 2013-02-11 LAB — PROTIME-INR: Protime: 27.6 Seconds — ABNORMAL HIGH (ref 10.6–13.4)

## 2013-02-11 LAB — POCT INR: INR: 2.3

## 2013-02-11 NOTE — Progress Notes (Addendum)
INR = 2.3    Goal 2-2.2 INR just above goal. Will continue Coumadin 10 mg daily, except 7.5 mg MWF. No complications of anticoagulation noted, no further bleeding or hemorrhoids. PCP increased Lasix dose from 40 mg to 80 mg daily due to swelling in ankles. She will return to [facility] 02/23/13 for lab at 10:00, Coumadin Clinic at 10:15, and Dr. Marko Plume at 10:30  Theone Murdoch, PharmD

## 2013-02-23 ENCOUNTER — Other Ambulatory Visit: Payer: Medicaid Other | Admitting: Lab

## 2013-02-23 ENCOUNTER — Ambulatory Visit: Payer: Medicaid Other

## 2013-02-23 ENCOUNTER — Ambulatory Visit: Payer: Self-pay

## 2013-02-28 ENCOUNTER — Telehealth: Payer: Self-pay | Admitting: Oncology

## 2013-02-28 NOTE — Telephone Encounter (Signed)
Gave pt appt for lab and Md for 11/26th with coumadin clinic

## 2013-03-02 ENCOUNTER — Ambulatory Visit (HOSPITAL_BASED_OUTPATIENT_CLINIC_OR_DEPARTMENT_OTHER): Payer: Medicaid Other | Admitting: Oncology

## 2013-03-02 ENCOUNTER — Ambulatory Visit (HOSPITAL_BASED_OUTPATIENT_CLINIC_OR_DEPARTMENT_OTHER): Payer: Medicaid Other | Admitting: Pharmacist

## 2013-03-02 ENCOUNTER — Other Ambulatory Visit (HOSPITAL_BASED_OUTPATIENT_CLINIC_OR_DEPARTMENT_OTHER): Payer: Medicaid Other | Admitting: Lab

## 2013-03-02 VITALS — BP 189/79 | HR 71 | Temp 97.4°F | Resp 17 | Ht 63.0 in | Wt 284.2 lb

## 2013-03-02 DIAGNOSIS — Z86718 Personal history of other venous thrombosis and embolism: Secondary | ICD-10-CM

## 2013-03-02 DIAGNOSIS — D649 Anemia, unspecified: Secondary | ICD-10-CM

## 2013-03-02 DIAGNOSIS — I82409 Acute embolism and thrombosis of unspecified deep veins of unspecified lower extremity: Secondary | ICD-10-CM

## 2013-03-02 DIAGNOSIS — Z86711 Personal history of pulmonary embolism: Secondary | ICD-10-CM

## 2013-03-02 DIAGNOSIS — I82723 Chronic embolism and thrombosis of deep veins of upper extremity, bilateral: Secondary | ICD-10-CM

## 2013-03-02 DIAGNOSIS — I82403 Acute embolism and thrombosis of unspecified deep veins of lower extremity, bilateral: Secondary | ICD-10-CM

## 2013-03-02 LAB — PROTIME-INR: Protime: 30 Seconds — ABNORMAL HIGH (ref 10.6–13.4)

## 2013-03-02 LAB — POCT INR: INR: 2.5

## 2013-03-02 NOTE — Patient Instructions (Signed)
Set up visit with Dr Marko Plume in ~ Nov 2015, coordinate with coumadin clinic then

## 2013-03-02 NOTE — Patient Instructions (Signed)
Continue 4 tablets daily except 3 tablets on Mon/Wed/Fri. Recheck PT/INR in 3 weeks on 03/23/13; 11:00 for lab and 11:15 for Coumadin clinic.

## 2013-03-02 NOTE — Progress Notes (Signed)
Pt seen prior to her MD appmt with Dr. Marko Plume INR=2.5 Pt states she missed a dose yesterday, so she took it this AM Instructed patient not to take another dose this evening Continue 4 tablets daily except 3 tablets on Mon/Wed/Fri. Recheck PT/INR in 3 weeks on 03/23/13; 11:00 for lab and 11:15 for Coumadin clinic.

## 2013-03-04 ENCOUNTER — Encounter: Payer: Self-pay | Admitting: Oncology
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
## 2013-03-07 ENCOUNTER — Telehealth: Payer: Self-pay | Admitting: Oncology

## 2013-03-07 NOTE — Telephone Encounter (Signed)
GAVE THE POF TO ANN MARLEY FOR THE PT'S YEAR APPT

## 2013-03-23 ENCOUNTER — Ambulatory Visit (HOSPITAL_BASED_OUTPATIENT_CLINIC_OR_DEPARTMENT_OTHER): Payer: Medicaid Other | Admitting: Pharmacist

## 2013-03-23 ENCOUNTER — Other Ambulatory Visit (HOSPITAL_BASED_OUTPATIENT_CLINIC_OR_DEPARTMENT_OTHER): Payer: Medicaid Other

## 2013-03-23 DIAGNOSIS — I82409 Acute embolism and thrombosis of unspecified deep veins of unspecified lower extremity: Secondary | ICD-10-CM

## 2013-03-23 DIAGNOSIS — I82729 Chronic embolism and thrombosis of deep veins of unspecified upper extremity: Secondary | ICD-10-CM

## 2013-03-23 DIAGNOSIS — I82403 Acute embolism and thrombosis of unspecified deep veins of lower extremity, bilateral: Secondary | ICD-10-CM

## 2013-03-23 DIAGNOSIS — I82723 Chronic embolism and thrombosis of deep veins of upper extremity, bilateral: Secondary | ICD-10-CM

## 2013-03-23 LAB — PROTIME-INR: Protime: 24 Seconds — ABNORMAL HIGH (ref 10.6–13.4)

## 2013-03-23 LAB — POCT INR: INR: 2

## 2013-03-24 NOTE — Patient Instructions (Signed)
Continue 4 tablets daily except 3 tablets on Mon/Wed/Fri. Recheck PT/INR in 4 weeks on 04/20/13; 11:00am for lab and 11:15am for Coumadin clinic.

## 2013-03-24 NOTE — Progress Notes (Signed)
INR within goal today. Pt has increased her calcium to 2000mg  daily (total daily dose): she divides it up throughout the day. She also recently started a new medication for BP; Norvasc 2.5mg  daily.  No interaction noted with coumadin.  No missed doses. No changes in diet. No problems to report regarding anticoagulation. Will continue same dose. Continue 4 tablets daily except 3 tablets on Mon/Wed/Fri. Recheck PT/INR in 4 weeks on 04/20/13; 11:00am for lab and 11:15am for Coumadin clinic.

## 2013-03-25 ENCOUNTER — Other Ambulatory Visit: Payer: Self-pay | Admitting: Oncology

## 2013-03-25 DIAGNOSIS — I82409 Acute embolism and thrombosis of unspecified deep veins of unspecified lower extremity: Secondary | ICD-10-CM

## 2013-04-20 ENCOUNTER — Telehealth: Payer: Self-pay | Admitting: Pharmacist

## 2013-04-20 ENCOUNTER — Other Ambulatory Visit: Payer: Medicaid Other

## 2013-04-20 ENCOUNTER — Ambulatory Visit: Payer: Medicaid Other

## 2013-04-26 ENCOUNTER — Other Ambulatory Visit (HOSPITAL_BASED_OUTPATIENT_CLINIC_OR_DEPARTMENT_OTHER): Payer: Medicaid Other

## 2013-04-26 ENCOUNTER — Ambulatory Visit (HOSPITAL_BASED_OUTPATIENT_CLINIC_OR_DEPARTMENT_OTHER): Payer: Medicaid Other | Admitting: Pharmacist

## 2013-04-26 DIAGNOSIS — I82409 Acute embolism and thrombosis of unspecified deep veins of unspecified lower extremity: Secondary | ICD-10-CM

## 2013-04-26 DIAGNOSIS — I82729 Chronic embolism and thrombosis of deep veins of unspecified upper extremity: Secondary | ICD-10-CM

## 2013-04-26 DIAGNOSIS — I82723 Chronic embolism and thrombosis of deep veins of upper extremity, bilateral: Secondary | ICD-10-CM

## 2013-04-26 LAB — POCT INR: INR: 2.3

## 2013-04-26 LAB — PROTIME-INR
INR: 2.3 (ref 2.00–3.50)
Protime: 27.6 Seconds — ABNORMAL HIGH (ref 10.6–13.4)

## 2013-04-26 NOTE — Progress Notes (Signed)
INR = 2.3 on Coumadin 10 mg/day; 7.5 mg MWF Pt doing fine re: anticoag. Her Norvasc dose was increased recently.  No other med changes since last visit. INR at goal.  No change to dose. Return in 1 month. Kennith Center, Pharm.D., CPP 04/26/2013@2 :13 PM

## 2013-04-27 ENCOUNTER — Other Ambulatory Visit: Payer: Self-pay

## 2013-04-27 DIAGNOSIS — I82409 Acute embolism and thrombosis of unspecified deep veins of unspecified lower extremity: Secondary | ICD-10-CM

## 2013-04-27 MED ORDER — WARFARIN SODIUM 2.5 MG PO TABS: ORAL_TABLET | ORAL | Status: DC

## 2013-05-24 ENCOUNTER — Ambulatory Visit: Payer: Medicaid Other

## 2013-05-24 ENCOUNTER — Other Ambulatory Visit: Payer: Medicaid Other

## 2013-05-31 ENCOUNTER — Other Ambulatory Visit: Payer: Medicaid Other

## 2013-05-31 ENCOUNTER — Ambulatory Visit: Payer: Medicaid Other

## 2013-06-07 ENCOUNTER — Ambulatory Visit (HOSPITAL_BASED_OUTPATIENT_CLINIC_OR_DEPARTMENT_OTHER): Payer: Medicaid Other | Admitting: Pharmacist

## 2013-06-07 ENCOUNTER — Other Ambulatory Visit (HOSPITAL_BASED_OUTPATIENT_CLINIC_OR_DEPARTMENT_OTHER): Payer: Medicaid Other

## 2013-06-07 DIAGNOSIS — I82409 Acute embolism and thrombosis of unspecified deep veins of unspecified lower extremity: Secondary | ICD-10-CM

## 2013-06-07 DIAGNOSIS — I82723 Chronic embolism and thrombosis of deep veins of upper extremity, bilateral: Secondary | ICD-10-CM

## 2013-06-07 LAB — POCT INR: INR: 2.9

## 2013-06-07 LAB — PROTIME-INR
INR: 2.9 (ref 2.00–3.50)
PROTIME: 34.8 s — AB (ref 10.6–13.4)

## 2013-06-07 NOTE — Progress Notes (Signed)
INR above goal today. Goal INR = 2-2.2 INR likely above goal due to starting Livalo about 2 weeks ago. Pt doing well. No concerns regarding anticoagulation. No unusual bruising. No bleeding. No changes in diet. No missed doses. Pt started Livalo 2mg  QHS about 2 weeks ago. This can interact with coumadin and increase her INR. Will decrease coumadin to 7.5mg  (3 tablets) daily except 10mg  (4 tablets) on Su&Thu. Recheck PT/INR in 2 weeks on 06/21/13; 10:30am for lab and 10:45am for Coumadin clinic.

## 2013-06-07 NOTE — Patient Instructions (Addendum)
Decrease coumadin to 3 tablets daily except 4 tablets on Su&Thu. Recheck PT/INR in 2 weeks on 06/21/13; 10:30am for lab and 10:45am for Coumadin clinic.

## 2013-06-21 ENCOUNTER — Other Ambulatory Visit: Payer: Medicaid Other

## 2013-06-21 ENCOUNTER — Ambulatory Visit: Payer: Medicaid Other

## 2013-06-28 ENCOUNTER — Other Ambulatory Visit (HOSPITAL_BASED_OUTPATIENT_CLINIC_OR_DEPARTMENT_OTHER): Payer: Medicaid Other

## 2013-06-28 ENCOUNTER — Ambulatory Visit (HOSPITAL_BASED_OUTPATIENT_CLINIC_OR_DEPARTMENT_OTHER): Payer: Medicaid Other | Admitting: Pharmacist

## 2013-06-28 DIAGNOSIS — I82723 Chronic embolism and thrombosis of deep veins of upper extremity, bilateral: Secondary | ICD-10-CM

## 2013-06-28 DIAGNOSIS — Z7901 Long term (current) use of anticoagulants: Secondary | ICD-10-CM

## 2013-06-28 DIAGNOSIS — I82409 Acute embolism and thrombosis of unspecified deep veins of unspecified lower extremity: Secondary | ICD-10-CM

## 2013-06-28 LAB — PROTIME-INR
INR: 2.9 (ref 2.00–3.50)
Protime: 34.8 Seconds — ABNORMAL HIGH (ref 10.6–13.4)

## 2013-06-28 LAB — POCT INR: INR: 2.9

## 2013-06-28 NOTE — Progress Notes (Signed)
INR = 2.9 on Coumadin 7.5 mg daily except 10 mg on Sun/Thurs. No bleeding.  She has a large bruise on the R forearm.  A plate fell & hit her arm.  Pt states this is much improved. No vision changes.  She has eye doctor appt this Thurs. She was started on Livalo for her cholesterol.  This has caused an increase in her INR.  Pt goes back to her PCP for repeat lipids on 07/18/13. INR elevated.  Change Coumadin to 7.5 mg/day except 5 mg TuThSat. Repeat INR in 2 weeks (on 07/12/13) Kennith Center, Pharm.D., CPP 06/28/2013@11 :00 AM

## 2013-07-05 ENCOUNTER — Telehealth: Payer: Self-pay | Admitting: Pharmacist

## 2013-07-05 NOTE — Telephone Encounter (Signed)
Pt called to report a bloody nose all day long.  "I have been thru a whole box of tissues today.  Should I take my coumadin tonight?" Instructed patient not to take her coumadin tonight and come to [facility] in AM.   9am appmt scheduled.   Pt stated she was not bleeding any other place. Relayed correct coumadin dose back to me.

## 2013-07-06 ENCOUNTER — Ambulatory Visit (HOSPITAL_BASED_OUTPATIENT_CLINIC_OR_DEPARTMENT_OTHER): Payer: Medicaid Other | Admitting: Pharmacist

## 2013-07-06 ENCOUNTER — Other Ambulatory Visit (HOSPITAL_BASED_OUTPATIENT_CLINIC_OR_DEPARTMENT_OTHER): Payer: Medicaid Other

## 2013-07-06 ENCOUNTER — Telehealth: Payer: Self-pay

## 2013-07-06 ENCOUNTER — Other Ambulatory Visit: Payer: Self-pay | Admitting: Oncology

## 2013-07-06 ENCOUNTER — Ambulatory Visit (HOSPITAL_BASED_OUTPATIENT_CLINIC_OR_DEPARTMENT_OTHER): Payer: Medicaid Other

## 2013-07-06 DIAGNOSIS — I82409 Acute embolism and thrombosis of unspecified deep veins of unspecified lower extremity: Secondary | ICD-10-CM

## 2013-07-06 DIAGNOSIS — I82723 Chronic embolism and thrombosis of deep veins of upper extremity, bilateral: Secondary | ICD-10-CM

## 2013-07-06 DIAGNOSIS — Z7901 Long term (current) use of anticoagulants: Secondary | ICD-10-CM

## 2013-07-06 DIAGNOSIS — Z86711 Personal history of pulmonary embolism: Secondary | ICD-10-CM

## 2013-07-06 LAB — CBC WITH DIFFERENTIAL/PLATELET
BASO%: 0.9 % (ref 0.0–2.0)
Basophils Absolute: 0 10*3/uL (ref 0.0–0.1)
EOS%: 5.8 % (ref 0.0–7.0)
Eosinophils Absolute: 0.3 10*3/uL (ref 0.0–0.5)
HEMATOCRIT: 31.4 % — AB (ref 34.8–46.6)
HEMOGLOBIN: 10.3 g/dL — AB (ref 11.6–15.9)
LYMPH#: 0.8 10*3/uL — AB (ref 0.9–3.3)
LYMPH%: 16.6 % (ref 14.0–49.7)
MCH: 29.8 pg (ref 25.1–34.0)
MCHC: 32.8 g/dL (ref 31.5–36.0)
MCV: 91 fL (ref 79.5–101.0)
MONO#: 0.4 10*3/uL (ref 0.1–0.9)
MONO%: 8.6 % (ref 0.0–14.0)
NEUT#: 3.4 10*3/uL (ref 1.5–6.5)
NEUT%: 68.1 % (ref 38.4–76.8)
Platelets: 137 10*3/uL — ABNORMAL LOW (ref 145–400)
RBC: 3.45 10*6/uL — ABNORMAL LOW (ref 3.70–5.45)
RDW: 14.8 % — ABNORMAL HIGH (ref 11.2–14.5)
WBC: 5 10*3/uL (ref 3.9–10.3)

## 2013-07-06 LAB — PROTIME-INR
INR: 2.3 (ref 2.00–3.50)
Protime: 27.6 Seconds — ABNORMAL HIGH (ref 10.6–13.4)

## 2013-07-06 LAB — POCT INR: INR: 2.3

## 2013-07-06 LAB — HOLD TUBE, BLOOD BANK

## 2013-07-06 NOTE — Progress Notes (Signed)
INR = 2.3 Hgb = 10.3 g/dL (Dr. Marko Plume made aware by her RN; pt instructed to leave before results back per Dr. Marko Plume) Pt called yesterday w/ severe nose bleed.  Duration was approx 9 hours per pt.  She used an entire box of tissues & she passed clots. She appears pale today & she has had some dizziness. Pt was instructed yesterday to hold her Coumadin & she did follow those instructions.   I decreased her dose of Coumadin last week to 7.5 mg/day except 5 mg TuThSat when her INR = 2.9 Pt reports she has had 3-4 fresh spinach salads since her appt here last week. She did see her opthalmologist last Thurs & she was told there was "thinning of vessels" in her eye but no obvious bleeding.  She was Rx'd Travatan-Z gtts for each eye which she started last Thurs.  There is no direct interaction between Travatan-Z and Coumadin per Lexi-Drug Interaction screening tool. I do believe the new cholesterol drug she is on (Livalo) is the culprit here.  She has an appt w/ her PCP on 4/13 but she will call them today to report new changes & try to see them sooner. I have discussed w/ Dr. Marko Plume & we'll decrease pts Coumadin dose to 5 mg daily and repeat INR on 07/12/13. Dr. Marko Plume is going to try to add pt to her schedule as soon as it is possible. Kennith Center, Pharm.D., CPP 07/06/2013@11 :42 AM

## 2013-07-06 NOTE — Telephone Encounter (Signed)
Told [name] that her Hgb was good at 10.3 even with the nosebleed.  Her platelets were 137 which is slightly low but not low enough to cause the  Bleeding per Dr. Marko Plume.   Dr. Marko Plume will have an appt. Set up to see her in 2-4  weeks.  The schedulers will be calling her with the appointment.  Ms. Marking verbalized understanding.

## 2013-07-07 ENCOUNTER — Telehealth: Payer: Self-pay | Admitting: Oncology

## 2013-07-07 NOTE — Telephone Encounter (Signed)
lvm for pt regarding to April appt...mailed pt appt sched/avs adn letter

## 2013-07-12 ENCOUNTER — Ambulatory Visit (HOSPITAL_BASED_OUTPATIENT_CLINIC_OR_DEPARTMENT_OTHER): Payer: Medicaid Other | Admitting: Pharmacist

## 2013-07-12 ENCOUNTER — Other Ambulatory Visit (HOSPITAL_BASED_OUTPATIENT_CLINIC_OR_DEPARTMENT_OTHER): Payer: Medicaid Other

## 2013-07-12 DIAGNOSIS — I82409 Acute embolism and thrombosis of unspecified deep veins of unspecified lower extremity: Secondary | ICD-10-CM

## 2013-07-12 DIAGNOSIS — I82723 Chronic embolism and thrombosis of deep veins of upper extremity, bilateral: Secondary | ICD-10-CM

## 2013-07-12 LAB — PROTIME-INR
INR: 1.6 — AB (ref 2.00–3.50)
Protime: 19.2 Seconds — ABNORMAL HIGH (ref 10.6–13.4)

## 2013-07-12 LAB — POCT INR: INR: 1.6

## 2013-07-12 NOTE — Progress Notes (Signed)
INR = 1.6     Goal  2- 2.2 Patient has had no nosebleeds this week. She continues in [place] for hypercholesterolemia, but sees PCP on 4/13 and hopes to be taken off this drug. She has had muscle weakness and pain while on Livalo, PCP aware. No dietary or medication changes. No complications of anticoagulation noted. She will increase her Coumadin slightly to 5 mg daily except 7.5 mg on Tuesdays and Fridays. She will return on 07/19/13 for lab at 10:30 and Coumadin clinic at 10:45.  Theone Murdoch, PharmD

## 2013-07-19 ENCOUNTER — Ambulatory Visit (HOSPITAL_BASED_OUTPATIENT_CLINIC_OR_DEPARTMENT_OTHER): Payer: Medicaid Other | Admitting: Pharmacist

## 2013-07-19 ENCOUNTER — Other Ambulatory Visit (HOSPITAL_BASED_OUTPATIENT_CLINIC_OR_DEPARTMENT_OTHER): Payer: Medicaid Other

## 2013-07-19 DIAGNOSIS — I82409 Acute embolism and thrombosis of unspecified deep veins of unspecified lower extremity: Secondary | ICD-10-CM

## 2013-07-19 DIAGNOSIS — I82723 Chronic embolism and thrombosis of deep veins of upper extremity, bilateral: Secondary | ICD-10-CM

## 2013-07-19 LAB — PROTIME-INR
INR: 1.6 — ABNORMAL LOW (ref 2.00–3.50)
PROTIME: 19.2 s — AB (ref 10.6–13.4)

## 2013-07-19 LAB — POCT INR: INR: 1.6

## 2013-07-19 NOTE — Patient Instructions (Signed)
INR below goal Plan: Increase Coumadin to 5 mg daily, except 7.5 mg on Tuesday and Thursdays and Saturday. Recheck PT/INR on 08/01/13; 9:30 am for lab and 9:45 am for Coumadin clinic and Dr. Marko Plume appointment at 10am

## 2013-07-19 NOTE — Progress Notes (Signed)
INR below goal today again at 1.6 Pt is doing well with no complaints other than a sore right foot from stepping of her porch. She reports no missed or extra doses No unusual bleeding or bruising No diet or medication changes Pt is still taking Livalo. She did not see her PCP yet, the appointment was actually on 5/13 (not 4/13) Ms. Fauver may try and move this to an earlier date if possible.  Plan: Increase Coumadin to 5 mg daily, except 7.5 mg on Tuesday and Thursdays and Saturday. Recheck PT/INR on 08/01/13; 9:30 am for lab and 9:45 am for Coumadin clinic and Dr. Marko Plume appointment at 10am

## 2013-07-27 ENCOUNTER — Encounter: Payer: Self-pay | Admitting: Neurology

## 2013-07-27 ENCOUNTER — Ambulatory Visit (INDEPENDENT_AMBULATORY_CARE_PROVIDER_SITE_OTHER): Payer: Medicaid Other | Admitting: Neurology

## 2013-07-27 VITALS — BP 152/59 | HR 47 | Temp 97.0°F | Ht 63.0 in | Wt 285.0 lb

## 2013-07-27 DIAGNOSIS — Z5320 Procedure and treatment not carried out because of patient's decision for unspecified reasons: Secondary | ICD-10-CM

## 2013-07-27 NOTE — Progress Notes (Signed)
Subjective:    Patient ID: [name] is a 58 y.o. female.  HPI  Pt left without being seen at 11:48 d/t a family emergency.  Review of Systems  Constitutional: Positive for appetite change and fatigue.  HENT: Negative.   Eyes: Negative.   Respiratory: Positive for apnea (snoring).   Cardiovascular: Positive for leg swelling.       Murmur  Gastrointestinal: Negative.   Endocrine: Positive for cold intolerance.  Genitourinary: Negative.   Musculoskeletal: Positive for arthralgias, joint swelling and myalgias.  Skin: Negative.   Allergic/Immunologic: Negative.   Neurological: Positive for weakness and numbness.       Memory loss  Hematological: Negative.   Psychiatric/Behavioral: Positive for sleep disturbance (e.d.s., snoring, restless leg) and decreased concentration. The patient is nervous/anxious.     Objective:  Neurologic Exam  Physical Exam

## 2013-07-31 ENCOUNTER — Other Ambulatory Visit: Payer: Self-pay | Admitting: Oncology

## 2013-07-31 DIAGNOSIS — D638 Anemia in other chronic diseases classified elsewhere: Secondary | ICD-10-CM

## 2013-07-31 DIAGNOSIS — D500 Iron deficiency anemia secondary to blood loss (chronic): Secondary | ICD-10-CM

## 2013-08-01 ENCOUNTER — Ambulatory Visit: Payer: Medicaid Other

## 2013-08-01 ENCOUNTER — Telehealth: Payer: Self-pay | Admitting: Oncology

## 2013-08-01 ENCOUNTER — Ambulatory Visit: Payer: Medicaid Other | Admitting: Oncology

## 2013-08-01 ENCOUNTER — Other Ambulatory Visit: Payer: Medicaid Other

## 2013-08-01 NOTE — Telephone Encounter (Signed)
, °

## 2013-08-07 ENCOUNTER — Other Ambulatory Visit: Payer: Self-pay | Admitting: Oncology

## 2013-08-08 ENCOUNTER — Other Ambulatory Visit: Payer: Self-pay | Admitting: *Deleted

## 2013-08-08 DIAGNOSIS — I82409 Acute embolism and thrombosis of unspecified deep veins of unspecified lower extremity: Secondary | ICD-10-CM

## 2013-08-08 MED ORDER — WARFARIN SODIUM 2.5 MG PO TABS: 7.5000 mg | ORAL_TABLET | ORAL | Status: DC

## 2013-08-10 ENCOUNTER — Other Ambulatory Visit (HOSPITAL_BASED_OUTPATIENT_CLINIC_OR_DEPARTMENT_OTHER): Payer: Self-pay

## 2013-08-10 ENCOUNTER — Ambulatory Visit: Payer: Medicaid Other | Admitting: Pharmacist

## 2013-08-10 ENCOUNTER — Encounter: Payer: Self-pay | Admitting: Oncology

## 2013-08-10 ENCOUNTER — Ambulatory Visit (HOSPITAL_BASED_OUTPATIENT_CLINIC_OR_DEPARTMENT_OTHER): Payer: Medicaid Other | Admitting: Oncology

## 2013-08-10 VITALS — BP 149/80 | HR 48 | Temp 98.3°F | Resp 17 | Ht 63.0 in | Wt 286.1 lb

## 2013-08-10 DIAGNOSIS — D649 Anemia, unspecified: Secondary | ICD-10-CM

## 2013-08-10 DIAGNOSIS — I82409 Acute embolism and thrombosis of unspecified deep veins of unspecified lower extremity: Secondary | ICD-10-CM

## 2013-08-10 DIAGNOSIS — Z86711 Personal history of pulmonary embolism: Secondary | ICD-10-CM

## 2013-08-10 DIAGNOSIS — E119 Type 2 diabetes mellitus without complications: Secondary | ICD-10-CM

## 2013-08-10 DIAGNOSIS — Z86718 Personal history of other venous thrombosis and embolism: Secondary | ICD-10-CM

## 2013-08-10 DIAGNOSIS — Z7901 Long term (current) use of anticoagulants: Secondary | ICD-10-CM

## 2013-08-10 DIAGNOSIS — D500 Iron deficiency anemia secondary to blood loss (chronic): Secondary | ICD-10-CM

## 2013-08-10 DIAGNOSIS — D638 Anemia in other chronic diseases classified elsewhere: Secondary | ICD-10-CM

## 2013-08-10 LAB — IRON AND TIBC CHCC
%SAT: 24 % (ref 21–57)
Iron: 77 ug/dL (ref 41–142)
TIBC: 318 ug/dL (ref 236–444)
UIBC: 241 ug/dL (ref 120–384)

## 2013-08-10 LAB — CBC WITH DIFFERENTIAL/PLATELET
BASO%: 0.7 % (ref 0.0–2.0)
Basophils Absolute: 0 10*3/uL (ref 0.0–0.1)
EOS ABS: 0.3 10*3/uL (ref 0.0–0.5)
EOS%: 5.5 % (ref 0.0–7.0)
HEMATOCRIT: 33 % — AB (ref 34.8–46.6)
HGB: 10.9 g/dL — ABNORMAL LOW (ref 11.6–15.9)
LYMPH%: 16.6 % (ref 14.0–49.7)
MCH: 30.2 pg (ref 25.1–34.0)
MCHC: 33.1 g/dL (ref 31.5–36.0)
MCV: 91.2 fL (ref 79.5–101.0)
MONO#: 0.5 10*3/uL (ref 0.1–0.9)
MONO%: 8.6 % (ref 0.0–14.0)
NEUT%: 68.6 % (ref 38.4–76.8)
NEUTROS ABS: 4 10*3/uL (ref 1.5–6.5)
PLATELETS: 155 10*3/uL (ref 145–400)
RBC: 3.62 10*6/uL — AB (ref 3.70–5.45)
RDW: 14.7 % — ABNORMAL HIGH (ref 11.2–14.5)
WBC: 5.9 10*3/uL (ref 3.9–10.3)
lymph#: 1 10*3/uL (ref 0.9–3.3)

## 2013-08-10 LAB — PROTIME-INR
INR: 1.7 — ABNORMAL LOW (ref 2.00–3.50)
PROTIME: 20.4 s — AB (ref 10.6–13.4)

## 2013-08-10 LAB — BASIC METABOLIC PANEL (CC13)
ANION GAP: 13 meq/L — AB (ref 3–11)
BUN: 41.5 mg/dL — ABNORMAL HIGH (ref 7.0–26.0)
CHLORIDE: 105 meq/L (ref 98–109)
CO2: 26 mEq/L (ref 22–29)
Calcium: 9.8 mg/dL (ref 8.4–10.4)
Creatinine: 1.5 mg/dL — ABNORMAL HIGH (ref 0.6–1.1)
Glucose: 78 mg/dl (ref 70–140)
POTASSIUM: 4.3 meq/L (ref 3.5–5.1)
SODIUM: 144 meq/L (ref 136–145)

## 2013-08-10 LAB — POCT INR: INR: 1.7

## 2013-08-10 LAB — FERRITIN CHCC: Ferritin: 53 ng/ml (ref 9–269)

## 2013-08-10 NOTE — Patient Instructions (Signed)
Increase Coumadin to 7.5mg  daily except 5 mg on Mondays & Fridays. Recheck PT/INR in 2 weeks on 08/25/13;  10:30am for lab and 10:45 am for Coumadin clinic.

## 2013-08-10 NOTE — Progress Notes (Signed)
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 Her coumadin is managed by [facility] coumadin clinic, goal INR 2.0-2.2. She has IVC filter in so ok to hold anticoagulation if needed, but otherwise continue in hopes of preventing significant clotting below the filter.  She had epistaxis in early April that lasted ~ 9 hours per her history; this eventually stopped without intervention and she was not evaluated then. She believes recent change in cholesterol medication has decreased her INR. She has follow up with Dr Abbott Pao nex week She has had no other bleeding. She denies increased SOB or chest pain. She has had no worsening of vision.     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 She had been off coumadin from time of the retinal bleed until this was resumed prior to DC from hospital. She had mild thrombocytopenia during hospitalization in 2013, HIT negative and not significantly different than counts back to at least 2011. 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 Goal INR with this history is 2.0 - 2.2.  Patient has multiple other significant co morbidities, including morbid obesity, HTN/ LBBB/ sinus bradycardia, pulmonary HTN, diabetes on insulin, stage IV chronic renal insufficiency,and anemia related to CRF/chronic disease/ bleeding from rectal fissure in 2013.  Review of systems as above, also: No recent fever or symptoms of illness. Difficulty tolerating several attempts at cholesterol medications. Remainder of 10 point Review of Systems negative.  Objective:  Vital signs in last 24  hours:  BP 149/80  Pulse 48  Temp(Src) 98.3 F (36.8 C) (Oral)  Resp 17  Ht 5\' 3"  (1.6 m)  Wt 286 lb 1.6 oz (129.774 kg)  BMI 50.69 kg/m2  SpO2 96% Weight is up 2 lbs. Alert, oriented and appropriate. Ambulatory with assistance.    HEENT:PERRL, sclerae not icteric. Oral mucosa moist without lesions, posterior pharynx clear. Nares not remarkable. Neck supple. No JVD.  Lymphatics:no cervical,suraclavicular adenopathy Resp: diminished BS bases consistent with size, otherwise clear to auscultation bilaterally and normal percussion bilaterally Cardio: regular rate and rhythm. No gallop. GI: abdomen obese, soft, nontender, not obviously distended, cannot appreciate any mass or organomegaly. Normally active bowel sounds. Musculoskeletal/ Extremities: without pitting edema, cords, tenderness Neuro: no peripheral neuropathy. Otherwise nonfocal Skin without rash, ecchymosis, petechiae   Lab Results:  Results for orders placed in visit on 08/10/13  IRON AND TIBC [facility]      Result Value Ref Range   Iron 77  41 - 142 ug/dL   TIBC 318  236 - 444 ug/dL   UIBC 241  120 - 384 ug/dL   %SAT 24  21 - 57 %  FERRITIN [facility]      Result Value Ref Range   Ferritin 53  9 - 269 ng/ml  BASIC METABOLIC PANEL (0000000)      Result Value Ref Range   Sodium 144  136 - 145 mEq/L   Potassium 4.3  3.5 - 5.1 mEq/L   Chloride 105  98 - 109 mEq/L   CO2 26  22 - 29 mEq/L   Glucose 78  70 - 140 mg/dl   BUN 41.5 (*)  7.0 - 26.0 mg/dL   Creatinine 1.5 (*) 0.6 - 1.1 mg/dL   Calcium 9.8  8.4 - 10.4 mg/dL   Anion Gap 13 (*) 3 - 11 mEq/L  CBC WITH DIFFERENTIAL      Result Value Ref Range   WBC 5.9  3.9 - 10.3 10e3/uL   NEUT# 4.0  1.5 - 6.5 10e3/uL   HGB 10.9 (*) 11.6 - 15.9 g/dL   HCT 33.0 (*) 34.8 - 46.6 %   Platelets 155  145 - 400 10e3/uL   MCV 91.2  79.5 - 101.0 fL   MCH 30.2  25.1 - 34.0 pg   MCHC 33.1  31.5 - 36.0 g/dL   RBC 3.62 (*) 3.70 - 5.45 10e6/uL   RDW 14.7 (*) 11.2 - 14.5 %   lymph# 1.0  0.9 -  3.3 10e3/uL   MONO# 0.5  0.1 - 0.9 10e3/uL   Eosinophils Absolute 0.3  0.0 - 0.5 10e3/uL   Basophils Absolute 0.0  0.0 - 0.1 10e3/uL   NEUT% 68.6  38.4 - 76.8 %   LYMPH% 16.6  14.0 - 49.7 %   MONO% 8.6  0.0 - 14.0 %   EOS% 5.5  0.0 - 7.0 %   BASO% 0.7  0.0 - 2.0 %  PROTIME-INR      Result Value Ref Range   Protime 20.4 (*) 10.6 - 13.4 Seconds   INR 1.70 (*) 2.00 - 3.50   Lovenox No      Iron studies above improved compared with 05-2012.  Studies/Results:  No results found.  Medications: I have reviewed the patient's current medications. Agree with coumadin dosing per pharmacist, their help much appreciated. Discussed Afrin nose spray for acute epistaxis.  Assessment/Plan:  1.History of several episodes of PEs, IVC filter in, history of retinal bleed ?while on coumadin, symptomatic bilateral LE DVTs 2013 such that she is back on coumadin. Appreciate coumadin clinic assistance, especially as this office is very convenient location for her. MD visit 1 year. 2.multifactorial anemia, including recent epistaxis, on ferrous gluconate. She is encouraged to seek medical attention if significant epistaxis. 3.intermittent mild thrombocytopenia since at least 2011, HIT negative. Platelet count normal by last CBC Oct 2014  4.morbid obesity, BMI >50 5.cardiac disease with HTN, LBBB and hx sinus bradycardia 6.diabetes  7.pulmonary HTN     Patient is comfortable with discussion and plan.   Gordy Levan, MD   08/10/2013, 3:24 PM

## 2013-08-10 NOTE — Progress Notes (Signed)
INR slightly increased but still below below goal today. Hg/Hct: 10.9/33, Pltc = 155 Pt saw Dr. Marko Plume today. Pt stopped taking Gabapentin about 1 week ago because her insurance wouldn't cover it. No other medication changes. No problems or concerns regarding anticoagulation. No s/s of clotting noted. No changes in diet. No missed coumadin doses. Will further increase Coumadin to 7.5mg  daily except 5 mg on Mondays & Fridays. Recheck PT/INR in 2 weeks on 08/25/13;  10:30am for lab and 10:45 am for Coumadin clinic.

## 2013-08-11 ENCOUNTER — Telehealth: Payer: Self-pay

## 2013-08-11 NOTE — Telephone Encounter (Signed)
Left message in home voice mail stating the results and recommendations as noted below by Dr. Marko Plume.

## 2013-08-11 NOTE — Telephone Encounter (Signed)
Message copied by Baruch Merl on Thu Aug 11, 2013  8:41 AM ------      Message from: Gordy Levan      Created: Wed Aug 10, 2013  3:38 PM       Labs seen and need follow up: please let her know the iron levels are better today, but would probably still be a good idea to continue the iron tablets for now. ------

## 2013-08-22 ENCOUNTER — Encounter: Payer: Self-pay | Admitting: Oncology

## 2013-08-22 NOTE — Progress Notes (Signed)
Medical Oncology  Labs 08-17-13 and office note 05-20-13 received from PCP Dr Abbott Pao, will be scanned into EMR. Chemistries: Na 140, K 3.7, glu 111, BUN 34, creat 1.22, Tbili 0.5, AP 50, AST 19, ALT 12, Tprot 6.7, alb 3.8  L.Marko Plume MD

## 2013-08-25 ENCOUNTER — Other Ambulatory Visit: Payer: Self-pay

## 2013-08-25 ENCOUNTER — Ambulatory Visit: Payer: Self-pay

## 2013-08-31 ENCOUNTER — Ambulatory Visit (HOSPITAL_BASED_OUTPATIENT_CLINIC_OR_DEPARTMENT_OTHER): Payer: Self-pay | Admitting: Pharmacist

## 2013-08-31 ENCOUNTER — Other Ambulatory Visit (HOSPITAL_BASED_OUTPATIENT_CLINIC_OR_DEPARTMENT_OTHER): Payer: Self-pay

## 2013-08-31 DIAGNOSIS — I82409 Acute embolism and thrombosis of unspecified deep veins of unspecified lower extremity: Secondary | ICD-10-CM

## 2013-08-31 DIAGNOSIS — I82723 Chronic embolism and thrombosis of deep veins of upper extremity, bilateral: Secondary | ICD-10-CM

## 2013-08-31 LAB — PROTIME-INR
INR: 2.7 (ref 2.00–3.50)
PROTIME: 32.4 s — AB (ref 10.6–13.4)

## 2013-08-31 LAB — POCT INR: INR: 2.7

## 2013-08-31 NOTE — Patient Instructions (Signed)
decrease Coumadin to 7.5mg  daily except 5 mg on Mondays, Wednesdays & Fridays. Recheck PT/INR in 3 weeks on 09/21/13;  10:30am for lab and 10:45 am for Coumadin clinic.

## 2013-08-31 NOTE — Progress Notes (Signed)
Pt seen in clinic today INR=2.7 This is slightly supratherapeutic for her as her goal is 2-2.2 due to hx of retinal bleed No other changes to report No refills needed this visit Slight decrease Coumadin to 7.5mg  daily except 5 mg on Mondays, Wednesdays & Fridays.  Recheck PT/INR in 3 weeks on 09/21/13;  10:30am for lab and 10:45 am for Coumadin clinic.

## 2013-09-20 ENCOUNTER — Other Ambulatory Visit: Payer: Self-pay | Admitting: Pharmacist

## 2013-09-20 DIAGNOSIS — I82409 Acute embolism and thrombosis of unspecified deep veins of unspecified lower extremity: Secondary | ICD-10-CM

## 2013-09-21 ENCOUNTER — Other Ambulatory Visit (HOSPITAL_BASED_OUTPATIENT_CLINIC_OR_DEPARTMENT_OTHER): Payer: Self-pay

## 2013-09-21 ENCOUNTER — Ambulatory Visit (HOSPITAL_BASED_OUTPATIENT_CLINIC_OR_DEPARTMENT_OTHER): Payer: Self-pay | Admitting: Pharmacist

## 2013-09-21 DIAGNOSIS — I82409 Acute embolism and thrombosis of unspecified deep veins of unspecified lower extremity: Secondary | ICD-10-CM

## 2013-09-21 LAB — POCT INR: INR: 2.4

## 2013-09-21 LAB — PROTIME-INR
INR: 2.4 (ref 2.00–3.50)
Protime: 28.8 Seconds — ABNORMAL HIGH (ref 10.6–13.4)

## 2013-09-21 NOTE — Patient Instructions (Addendum)
Continue Coumadin 7.5mg  daily except 5 mg on Mondays, Wednesdays & Fridays.  Recheck PT/INR in 5 weeks on 10/26/13;  10:30am for lab and 10:45 am for Coumadin clinic.

## 2013-09-21 NOTE — Progress Notes (Signed)
INR essentially at goal today. MD is ok win INR ~ 2.5. No changes in diet or medications. No missed or extra coumadin doses. No problems or concerns with anticoagulation. Continue Coumadin 7.5mg  daily except 5 mg on Mondays, Wednesdays & Fridays.  Recheck PT/INR in 5 weeks on 10/26/13;  10:30am for lab and 10:45 am for Coumadin clinic.

## 2013-10-26 ENCOUNTER — Telehealth: Payer: Self-pay | Admitting: Oncology

## 2013-10-26 ENCOUNTER — Other Ambulatory Visit (HOSPITAL_BASED_OUTPATIENT_CLINIC_OR_DEPARTMENT_OTHER): Payer: Self-pay

## 2013-10-26 ENCOUNTER — Ambulatory Visit (HOSPITAL_BASED_OUTPATIENT_CLINIC_OR_DEPARTMENT_OTHER): Payer: Self-pay | Admitting: Pharmacist

## 2013-10-26 DIAGNOSIS — I82409 Acute embolism and thrombosis of unspecified deep veins of unspecified lower extremity: Secondary | ICD-10-CM

## 2013-10-26 LAB — POCT INR: INR: 2

## 2013-10-26 LAB — PROTIME-INR
INR: 2 (ref 2.00–3.50)
Protime: 24 Seconds — ABNORMAL HIGH (ref 10.6–13.4)

## 2013-10-26 NOTE — Patient Instructions (Signed)
INR at goal No changes Continue Coumadin 7.5mg  daily except 5 mg on Mondays, Wednesdays & Fridays.  Recheck PT/INR in 5 weeks on 12/07/13;  10:30am for lab and 10:45 am for Coumadin clinic.

## 2013-10-26 NOTE — Progress Notes (Signed)
INR at goal Pt doing well with only complaint of some knee pain that comes and goes No unusual bleeding or bruising No missed or extra doses No medication or diet changes Plan: Continue Coumadin 7.5mg  daily except 5 mg on Mondays, Wednesdays & Fridays.  Recheck PT/INR in 5 weeks on 12/07/13;  10:30am for lab and 10:45 am for Coumadin clinic.

## 2013-10-26 NOTE — Telephone Encounter (Signed)
per Gerald Stabs to sch CC & lab-pt aware

## 2013-11-14 NOTE — Telephone Encounter (Signed)
Phone call - encounter closed. 

## 2013-12-01 NOTE — Telephone Encounter (Signed)
Encounter was telephone call. 

## 2013-12-02 ENCOUNTER — Other Ambulatory Visit: Payer: Self-pay | Admitting: *Deleted

## 2013-12-02 DIAGNOSIS — Z7901 Long term (current) use of anticoagulants: Secondary | ICD-10-CM

## 2013-12-02 MED ORDER — WARFARIN SODIUM 2.5 MG PO TABS: 7.5000 mg | ORAL_TABLET | ORAL | Status: DC

## 2013-12-02 NOTE — Telephone Encounter (Signed)
RECEIVED A FAX AND CALLED [facility] CONCERNING CLARIFICATION OF DIRECTIONS FOR WARFARIN. NEW DIRECTIONS SHOULD BE 2-4 TABLETS DAILY OR AS DIRECTED.

## 2013-12-07 ENCOUNTER — Ambulatory Visit (HOSPITAL_BASED_OUTPATIENT_CLINIC_OR_DEPARTMENT_OTHER): Payer: Self-pay | Admitting: Pharmacist

## 2013-12-07 ENCOUNTER — Other Ambulatory Visit (HOSPITAL_BASED_OUTPATIENT_CLINIC_OR_DEPARTMENT_OTHER): Payer: Self-pay

## 2013-12-07 DIAGNOSIS — I82409 Acute embolism and thrombosis of unspecified deep veins of unspecified lower extremity: Secondary | ICD-10-CM

## 2013-12-07 LAB — PROTIME-INR
INR: 2.5 (ref 2.00–3.50)
Protime: 30 Seconds — ABNORMAL HIGH (ref 10.6–13.4)

## 2013-12-07 LAB — POCT INR: INR: 2.5

## 2013-12-07 NOTE — Progress Notes (Signed)
INR close to goal of 2-2.2.  No bleeding or usual bruising.  [name] has just finished a 7 day course of cephalexin for a UTI.  She states that she is still symptomatic and has notified her MD about this.  I instructed Leah Shetley to let us know if she is prescribed another antibiotic for her UTI since many interact with her coumadin.  She said that she would let us know if she starts a new antibiotic  Leah Boisvert states that she has lost weight with changing her diet, which does not seem to effect her INR, and some exercise.  Will continue current coumadin dose of 5mg  MWF and 7.5mg  other days, which Leah Sharpley INR has been stable since May.  Will check PT/INR in 2 months.

## 2014-02-01 ENCOUNTER — Other Ambulatory Visit: Payer: Self-pay

## 2014-02-01 ENCOUNTER — Ambulatory Visit: Payer: Self-pay

## 2014-02-07 ENCOUNTER — Ambulatory Visit (HOSPITAL_BASED_OUTPATIENT_CLINIC_OR_DEPARTMENT_OTHER): Payer: Medicare Other | Admitting: Pharmacist

## 2014-02-07 ENCOUNTER — Other Ambulatory Visit (HOSPITAL_BASED_OUTPATIENT_CLINIC_OR_DEPARTMENT_OTHER): Payer: Medicare Other

## 2014-02-07 DIAGNOSIS — I82409 Acute embolism and thrombosis of unspecified deep veins of unspecified lower extremity: Secondary | ICD-10-CM

## 2014-02-07 LAB — PROTIME-INR
INR: 1.9 — AB (ref 2.00–3.50)
Protime: 22.8 Seconds — ABNORMAL HIGH (ref 10.6–13.4)

## 2014-02-07 LAB — POCT INR: INR: 1.9

## 2014-02-07 NOTE — Progress Notes (Signed)
INR close to goal of 2-2.2 on coumadin 5mg  MWF and 7.5mg  other days.  [name] has been taking an occasional vicodin for pain and diazepam for sleep but no other med changes.  No bleeding or bruising.  Will continue current coumadin dose and check PT/INR in 2 months.

## 2014-04-11 ENCOUNTER — Other Ambulatory Visit: Payer: Medicare Other

## 2014-04-11 ENCOUNTER — Ambulatory Visit: Payer: Medicare Other

## 2014-04-11 ENCOUNTER — Telehealth: Payer: Self-pay | Admitting: Pharmacist

## 2014-04-11 NOTE — Telephone Encounter (Signed)
Ms. Mendell called today stated she was not feeling well and would need to reschedule her coumadin clinic visit. Appointments changed from today to Friday 04/14/14 at 2pm for lab and 2:15pm for coumadin clinic. Message to scheduling  Thank you,  Montel Clock, Pharm.D., BCOP

## 2014-04-14 ENCOUNTER — Other Ambulatory Visit (HOSPITAL_BASED_OUTPATIENT_CLINIC_OR_DEPARTMENT_OTHER): Payer: Medicare HMO

## 2014-04-14 ENCOUNTER — Ambulatory Visit (HOSPITAL_BASED_OUTPATIENT_CLINIC_OR_DEPARTMENT_OTHER): Payer: Medicare HMO | Admitting: Pharmacist

## 2014-04-14 DIAGNOSIS — Z7901 Long term (current) use of anticoagulants: Secondary | ICD-10-CM

## 2014-04-14 DIAGNOSIS — I82409 Acute embolism and thrombosis of unspecified deep veins of unspecified lower extremity: Secondary | ICD-10-CM

## 2014-04-14 LAB — PROTIME-INR
INR: 1.7 — ABNORMAL LOW (ref 2.00–3.50)
Protime: 20.4 Seconds — ABNORMAL HIGH (ref 10.6–13.4)

## 2014-04-14 LAB — POCT INR: INR: 1.7

## 2014-04-14 MED ORDER — WARFARIN SODIUM 2.5 MG PO TABS: ORAL_TABLET | ORAL | Status: DC

## 2014-04-14 NOTE — Progress Notes (Signed)
INR = 1.7 on Coumadin 7.5 mg daily except 5 mg on MWF Pt reports increased fluid weight gain (13 # gain) on 04/10/14 and 04/11/14.  She had SOB & wheezing.  She has stopped her Metoprolol because she thought she was on 2 beta-blockers.  She did not call her cardiologist because she knew they'd want to admit her to the hospital for CHF.  She self-medicated w/ additional diuretics and today she is back to normal weight and is able to ambulate without difficulty or SOB. We reviewed her meds and I educated her on beta-blockers, Angiotensin II blockers and Calcium Channel blockers.  She understands she is not on duplicative therapy w/ beta-blockers.  She will resume her Metoprolol tonight. She missed 1 day of her Coumadin last Tuesday so she "made up for it" by taking additional tablet (= 7.5 mg total) on Wed & Fri last week. Asymptomatic for VTE. INR low today.  Likely a combination of edema, missing a total of 2.5 mg of Coumadin last week & increased vit K in her diet. She will take 2.5 mg extra today (= 7.5 mg today) then resume her usual dose of Coumadin. Return in 1 month. I refilled her Coumadin 2.5 mg tabs today at her request. Kennith Center, Pharm.D., CPP 04/14/2014@3 :06 PM

## 2014-04-17 ENCOUNTER — Encounter: Payer: Self-pay | Admitting: Pharmacist

## 2014-04-17 ENCOUNTER — Encounter: Payer: Self-pay | Admitting: *Deleted

## 2014-04-17 NOTE — Progress Notes (Signed)
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 THIS INFORMATION WAS GIVEN DR.LIVESAY AND MELISSA GUJRATI (COUMADIN CLINIC).

## 2014-04-17 NOTE — Progress Notes (Signed)
Fax sent from pharmacy that will be changing manufacturer of warfarin.  May effect INR.  Will evaluate with next INR check to be scheduled on 05/12/14.

## 2014-05-12 ENCOUNTER — Other Ambulatory Visit (HOSPITAL_BASED_OUTPATIENT_CLINIC_OR_DEPARTMENT_OTHER): Payer: Medicare HMO

## 2014-05-12 ENCOUNTER — Ambulatory Visit (HOSPITAL_BASED_OUTPATIENT_CLINIC_OR_DEPARTMENT_OTHER): Payer: Medicare HMO | Admitting: Pharmacist

## 2014-05-12 DIAGNOSIS — I82409 Acute embolism and thrombosis of unspecified deep veins of unspecified lower extremity: Secondary | ICD-10-CM

## 2014-05-12 DIAGNOSIS — Z86711 Personal history of pulmonary embolism: Secondary | ICD-10-CM

## 2014-05-12 DIAGNOSIS — Z86718 Personal history of other venous thrombosis and embolism: Secondary | ICD-10-CM

## 2014-05-12 LAB — PROTIME-INR
INR: 1.6 — ABNORMAL LOW (ref 2.00–3.50)
PROTIME: 19.2 s — AB (ref 10.6–13.4)

## 2014-05-12 LAB — POCT INR: INR: 1.6

## 2014-05-12 NOTE — Progress Notes (Signed)
INR = 1.6 on Coumadin 7.5 mg daily except 5 mg MWF She missed 1 dose of Coumadin on Sunday this week (7.5 mg).  She already took an extra tablet of Coumadin today (7.5 mg). Her generic Warfarin recently changed manufacturers & we were notified of this by her [place]. Pt having increased edema today & some wheezing.  She ran out of her "fluid pill" but plans to pick up her RX today. INR low again today.  Perhaps due to missed dose and new manufacturer of her generic Warfarin. We'll increase her Coumadin to 7.5 mg daily except 5 mg on Mon/Wed. Recheck INR in 3 weeks. Kennith Center, Pharm.D., CPP 05/12/2014@2 :28 PM

## 2014-05-25 ENCOUNTER — Telehealth: Payer: Self-pay | Admitting: Oncology

## 2014-05-25 NOTE — Telephone Encounter (Signed)
, °

## 2014-06-02 ENCOUNTER — Other Ambulatory Visit: Payer: Medicare HMO

## 2014-06-02 ENCOUNTER — Ambulatory Visit: Payer: Medicare HMO

## 2014-06-02 ENCOUNTER — Telehealth: Payer: Self-pay | Admitting: Pharmacist

## 2014-06-02 NOTE — Telephone Encounter (Signed)
Pt called [place].  She cannot come for lab/CC today.  Death in family. Resched to 06/28/2014. Kennith Center, Pharm.D., CPP 06/02/2014@10 :30 AM

## 2014-06-05 ENCOUNTER — Ambulatory Visit (HOSPITAL_BASED_OUTPATIENT_CLINIC_OR_DEPARTMENT_OTHER): Payer: Self-pay | Admitting: Pharmacist

## 2014-06-05 ENCOUNTER — Other Ambulatory Visit (HOSPITAL_BASED_OUTPATIENT_CLINIC_OR_DEPARTMENT_OTHER): Payer: Medicare HMO

## 2014-06-05 DIAGNOSIS — Z86718 Personal history of other venous thrombosis and embolism: Secondary | ICD-10-CM

## 2014-06-05 DIAGNOSIS — I82409 Acute embolism and thrombosis of unspecified deep veins of unspecified lower extremity: Secondary | ICD-10-CM

## 2014-06-05 LAB — POCT INR: INR: 2.3

## 2014-06-05 LAB — PROTIME-INR
INR: 2.3 (ref 2.00–3.50)
PROTIME: 27.6 s — AB (ref 10.6–13.4)

## 2014-06-05 NOTE — Progress Notes (Signed)
INR = 2.3 on Coumadin 7.5 mg daily except 5 mg Mon/Wed Pt saw Dr. Mellody Drown (PCP) on 05/26/14.  Hgb = 11.2 g/dL; Hct = 33.3 Had TSH drawn & PCP doubled her Synthroid dose.  Pt started new dose on 2/26. She was also offered l-methylfolate (Folic Acid) / schizochytrium supplement for depression (pt did discuss this w/ PCP).  Pt took 1 dose and "got shaky" so she hasn't taken it again.  She wasn't sure if this supplement interacted w/ her other meds, esp. Coumadin.   I did [employer] database interaction check and found no interactions w/ the l-methylfolate (Folic Acid) but was not able to find schizochytrium (Algae-S powder) in that database in order to give pt information on interactions. INR at goal.  She'll continue the same Coumadin dose but I will have her return in 2 weeks since the synthroid dose has been increased, could see increase in INR. [name] will consider this supplementation offered & if she started it, she will do so in the next 2 weeks so we'll see if it has effect on her INR/bleeding risk. Kennith Center, Pharm.D., CPP 06/05/2014@2 :41 PM

## 2014-06-21 ENCOUNTER — Ambulatory Visit (HOSPITAL_BASED_OUTPATIENT_CLINIC_OR_DEPARTMENT_OTHER): Payer: Medicare HMO | Admitting: Pharmacist

## 2014-06-21 ENCOUNTER — Other Ambulatory Visit (HOSPITAL_BASED_OUTPATIENT_CLINIC_OR_DEPARTMENT_OTHER): Payer: Medicare HMO

## 2014-06-21 DIAGNOSIS — Z86718 Personal history of other venous thrombosis and embolism: Secondary | ICD-10-CM

## 2014-06-21 DIAGNOSIS — I82409 Acute embolism and thrombosis of unspecified deep veins of unspecified lower extremity: Secondary | ICD-10-CM

## 2014-06-21 LAB — PROTIME-INR
INR: 1.8 — ABNORMAL LOW (ref 2.00–3.50)
PROTIME: 21.6 s — AB (ref 10.6–13.4)

## 2014-06-21 LAB — POCT INR: INR: 1.8

## 2014-06-21 NOTE — Progress Notes (Signed)
INR slightly below goal of 2-2.2 today.  No bleeding/bruising.  No changes in diet.  [name] has decided not to take supplement for depression that was discussed in last coumadin clinic visit.  Will take coumadin 7.5mg  today then resume usual dose of 5mg  M/W and 7.5mg  other days.  Will check PT/INR in 1 month.

## 2014-07-19 ENCOUNTER — Other Ambulatory Visit: Payer: Medicare HMO

## 2014-07-19 ENCOUNTER — Ambulatory Visit: Payer: Medicare HMO

## 2014-07-19 ENCOUNTER — Telehealth: Payer: Self-pay | Admitting: Pharmacist

## 2014-07-19 NOTE — Telephone Encounter (Signed)
Patient called to reschedule today's Coumadin patient. She has chronic knee problems and her knee is swollen, she is having problems with her bad ankle today as well (dog slept on it). She plans to see her orthopedic surgeon soon. She does not feel that she can walk well enough to come in today. She will come to Coumadin clinic next Monday 4/18 at 2pm.

## 2014-07-24 ENCOUNTER — Other Ambulatory Visit (HOSPITAL_BASED_OUTPATIENT_CLINIC_OR_DEPARTMENT_OTHER): Payer: Medicare HMO

## 2014-07-24 ENCOUNTER — Ambulatory Visit (HOSPITAL_BASED_OUTPATIENT_CLINIC_OR_DEPARTMENT_OTHER): Payer: Medicare HMO | Admitting: Pharmacist

## 2014-07-24 DIAGNOSIS — Z86718 Personal history of other venous thrombosis and embolism: Secondary | ICD-10-CM

## 2014-07-24 DIAGNOSIS — Z86711 Personal history of pulmonary embolism: Secondary | ICD-10-CM

## 2014-07-24 DIAGNOSIS — I82409 Acute embolism and thrombosis of unspecified deep veins of unspecified lower extremity: Secondary | ICD-10-CM

## 2014-07-24 LAB — PROTIME-INR
INR: 2.2 (ref 2.00–3.50)
PROTIME: 26.4 s — AB (ref 10.6–13.4)

## 2014-07-24 LAB — POCT INR: INR: 2.2

## 2014-07-24 NOTE — Progress Notes (Signed)
INR = 2.2     Goal [id number] No complications of anticoagulation noted. Patient continues to have swelling in knees; this is a chronic problem. Ankle swelling she experienced last week has resolved. She states that she has been taking Coumadin 7.5 mg daily except 5 mg MWF. We have her dose as 7.5 mg daily except MW; will change to reflect dose she has been taking. She will continue Coumadin 7.5 mg daily except 5 mg on Mondays/Wednesdays/Friday. We will recheck PT/INR on 08/10/14 at 1pm for lab, 1:15pm for Coumadin clinic, and 1:30pm for Dr. Marko Plume.  Theone Murdoch, PharmD

## 2014-08-08 ENCOUNTER — Telehealth: Payer: Self-pay | Admitting: Oncology

## 2014-08-08 NOTE — Telephone Encounter (Signed)
Returned Advertising account executive. Confirmed appointment for 05/31.

## 2014-08-09 ENCOUNTER — Other Ambulatory Visit: Payer: Self-pay | Admitting: Oncology

## 2014-08-09 DIAGNOSIS — Z86711 Personal history of pulmonary embolism: Secondary | ICD-10-CM

## 2014-08-10 ENCOUNTER — Ambulatory Visit: Payer: Medicare HMO

## 2014-08-10 ENCOUNTER — Other Ambulatory Visit: Payer: Medicare HMO

## 2014-08-10 ENCOUNTER — Ambulatory Visit: Payer: Medicare HMO | Admitting: Oncology

## 2014-08-18 ENCOUNTER — Telehealth: Payer: Self-pay | Admitting: Oncology

## 2014-08-18 ENCOUNTER — Other Ambulatory Visit: Payer: Self-pay | Admitting: Oncology

## 2014-08-18 NOTE — Telephone Encounter (Signed)
Spoke with patient and moved her appointment to 5/26 for chemo pt per pof and to 5/26 due to no [place] c avail schedule yet,aware

## 2014-08-30 ENCOUNTER — Other Ambulatory Visit: Payer: Self-pay | Admitting: Oncology

## 2014-08-31 ENCOUNTER — Ambulatory Visit: Payer: Medicare HMO

## 2014-08-31 ENCOUNTER — Encounter (HOSPITAL_COMMUNITY): Payer: Self-pay | Admitting: Nurse Practitioner

## 2014-08-31 ENCOUNTER — Ambulatory Visit: Payer: Medicare HMO | Admitting: Oncology

## 2014-08-31 ENCOUNTER — Inpatient Hospital Stay (HOSPITAL_COMMUNITY): Payer: Medicare HMO

## 2014-08-31 ENCOUNTER — Emergency Department (HOSPITAL_COMMUNITY): Payer: Medicare HMO

## 2014-08-31 ENCOUNTER — Inpatient Hospital Stay (HOSPITAL_COMMUNITY)
Admission: EM | Admit: 2014-08-31 | Discharge: 2014-09-05 | DRG: 554 | Disposition: A | Discharge status: Home / Self Care | Payer: Medicare HMO | Attending: Family Medicine | Admitting: Family Medicine

## 2014-08-31 ENCOUNTER — Other Ambulatory Visit: Payer: Medicare HMO

## 2014-08-31 ENCOUNTER — Telehealth: Payer: Self-pay | Admitting: Pharmacist

## 2014-08-31 DIAGNOSIS — Z7982 Long term (current) use of aspirin: Secondary | ICD-10-CM | POA: Diagnosis not present

## 2014-08-31 DIAGNOSIS — Z87891 Personal history of nicotine dependence: Secondary | ICD-10-CM

## 2014-08-31 DIAGNOSIS — L03113 Cellulitis of right upper limb: Secondary | ICD-10-CM

## 2014-08-31 DIAGNOSIS — I4891 Unspecified atrial fibrillation: Secondary | ICD-10-CM | POA: Diagnosis present

## 2014-08-31 DIAGNOSIS — Z885 Allergy status to narcotic agent status: Secondary | ICD-10-CM | POA: Diagnosis not present

## 2014-08-31 DIAGNOSIS — I10 Essential (primary) hypertension: Secondary | ICD-10-CM | POA: Diagnosis not present

## 2014-08-31 DIAGNOSIS — Z86711 Personal history of pulmonary embolism: Secondary | ICD-10-CM | POA: Diagnosis present

## 2014-08-31 DIAGNOSIS — M1990 Unspecified osteoarthritis, unspecified site: Secondary | ICD-10-CM

## 2014-08-31 DIAGNOSIS — D500 Iron deficiency anemia secondary to blood loss (chronic): Secondary | ICD-10-CM | POA: Diagnosis present

## 2014-08-31 DIAGNOSIS — Z79891 Long term (current) use of opiate analgesic: Secondary | ICD-10-CM | POA: Diagnosis not present

## 2014-08-31 DIAGNOSIS — F329 Major depressive disorder, single episode, unspecified: Secondary | ICD-10-CM | POA: Diagnosis present

## 2014-08-31 DIAGNOSIS — I519 Heart disease, unspecified: Secondary | ICD-10-CM | POA: Diagnosis not present

## 2014-08-31 DIAGNOSIS — Z794 Long term (current) use of insulin: Secondary | ICD-10-CM | POA: Diagnosis not present

## 2014-08-31 DIAGNOSIS — N390 Urinary tract infection, site not specified: Secondary | ICD-10-CM | POA: Diagnosis present

## 2014-08-31 DIAGNOSIS — K219 Gastro-esophageal reflux disease without esophagitis: Secondary | ICD-10-CM | POA: Diagnosis present

## 2014-08-31 DIAGNOSIS — Z86718 Personal history of other venous thrombosis and embolism: Secondary | ICD-10-CM | POA: Diagnosis not present

## 2014-08-31 DIAGNOSIS — M7989 Other specified soft tissue disorders: Secondary | ICD-10-CM

## 2014-08-31 DIAGNOSIS — D61818 Other pancytopenia: Secondary | ICD-10-CM | POA: Diagnosis present

## 2014-08-31 DIAGNOSIS — N184 Chronic kidney disease, stage 4 (severe): Secondary | ICD-10-CM | POA: Diagnosis present

## 2014-08-31 DIAGNOSIS — E039 Hypothyroidism, unspecified: Secondary | ICD-10-CM | POA: Diagnosis present

## 2014-08-31 DIAGNOSIS — R609 Edema, unspecified: Secondary | ICD-10-CM | POA: Diagnosis present

## 2014-08-31 DIAGNOSIS — E785 Hyperlipidemia, unspecified: Secondary | ICD-10-CM | POA: Diagnosis not present

## 2014-08-31 DIAGNOSIS — E119 Type 2 diabetes mellitus without complications: Secondary | ICD-10-CM | POA: Diagnosis present

## 2014-08-31 DIAGNOSIS — D638 Anemia in other chronic diseases classified elsewhere: Secondary | ICD-10-CM | POA: Diagnosis present

## 2014-08-31 DIAGNOSIS — E669 Obesity, unspecified: Secondary | ICD-10-CM | POA: Diagnosis present

## 2014-08-31 DIAGNOSIS — D649 Anemia, unspecified: Secondary | ICD-10-CM | POA: Diagnosis present

## 2014-08-31 DIAGNOSIS — Z7901 Long term (current) use of anticoagulants: Secondary | ICD-10-CM

## 2014-08-31 DIAGNOSIS — I5189 Other ill-defined heart diseases: Secondary | ICD-10-CM | POA: Diagnosis present

## 2014-08-31 DIAGNOSIS — M109 Gout, unspecified: Principal | ICD-10-CM | POA: Diagnosis present

## 2014-08-31 DIAGNOSIS — I129 Hypertensive chronic kidney disease with stage 1 through stage 4 chronic kidney disease, or unspecified chronic kidney disease: Secondary | ICD-10-CM | POA: Diagnosis present

## 2014-08-31 DIAGNOSIS — Z6841 Body Mass Index (BMI) 40.0 and over, adult: Secondary | ICD-10-CM | POA: Diagnosis not present

## 2014-08-31 DIAGNOSIS — N189 Chronic kidney disease, unspecified: Secondary | ICD-10-CM | POA: Diagnosis not present

## 2014-08-31 DIAGNOSIS — M10041 Idiopathic gout, right hand: Secondary | ICD-10-CM | POA: Diagnosis not present

## 2014-08-31 HISTORY — DX: Other specified soft tissue disorders: M79.89

## 2014-08-31 HISTORY — DX: Urinary tract infection, site not specified: N39.0

## 2014-08-31 LAB — CBC
HEMATOCRIT: 35 % — AB (ref 36.0–46.0)
Hemoglobin: 11 g/dL — ABNORMAL LOW (ref 12.0–15.0)
MCH: 28.6 pg (ref 26.0–34.0)
MCHC: 31.4 g/dL (ref 30.0–36.0)
MCV: 91.1 fL (ref 78.0–100.0)
PLATELETS: 133 10*3/uL — AB (ref 150–400)
RBC: 3.84 MIL/uL — ABNORMAL LOW (ref 3.87–5.11)
RDW: 15.1 % (ref 11.5–15.5)
WBC: 7.2 10*3/uL (ref 4.0–10.5)

## 2014-08-31 LAB — PROTIME-INR
INR: 2.5 — ABNORMAL HIGH (ref 0.00–1.49)
Prothrombin Time: 26.7 seconds — ABNORMAL HIGH (ref 11.6–15.2)

## 2014-08-31 LAB — URINALYSIS, ROUTINE W REFLEX MICROSCOPIC
BILIRUBIN URINE: NEGATIVE
Glucose, UA: 100 mg/dL — AB
Ketones, ur: NEGATIVE mg/dL
Nitrite: NEGATIVE
Protein, ur: >300 mg/dL — AB
SPECIFIC GRAVITY, URINE: 1.014 (ref 1.005–1.030)
UROBILINOGEN UA: 0.2 mg/dL (ref 0.0–1.0)
pH: 6 (ref 5.0–8.0)

## 2014-08-31 LAB — BASIC METABOLIC PANEL
ANION GAP: 10 (ref 5–15)
BUN: 34 mg/dL — ABNORMAL HIGH (ref 6–20)
CO2: 26 mmol/L (ref 22–32)
Calcium: 9 mg/dL (ref 8.9–10.3)
Chloride: 103 mmol/L (ref 101–111)
Creatinine, Ser: 1.38 mg/dL — ABNORMAL HIGH (ref 0.44–1.00)
GFR calc non Af Amer: 41 mL/min — ABNORMAL LOW (ref >60)
GFR, EST AFRICAN AMERICAN: 48 mL/min — AB (ref >60)
GLUCOSE: 207 mg/dL — AB (ref 65–99)
Potassium: 4 mmol/L (ref 3.5–5.1)
SODIUM: 139 mmol/L (ref 135–145)

## 2014-08-31 LAB — GLUCOSE, CAPILLARY
Glucose-Capillary: 204 mg/dL — ABNORMAL HIGH (ref 65–99)
Glucose-Capillary: 186 mg/dL — ABNORMAL HIGH (ref 65–99)
Glucose-Capillary: 179 mg/dL — ABNORMAL HIGH (ref 65–99)

## 2014-08-31 LAB — URINE MICROSCOPIC-ADD ON

## 2014-08-31 LAB — CBG MONITORING, ED: GLUCOSE-CAPILLARY: 200 mg/dL — AB (ref 65–99)

## 2014-08-31 LAB — C-REACTIVE PROTEIN: CRP: 6.8 mg/dL — AB (ref <1.0)

## 2014-08-31 LAB — SEDIMENTATION RATE: SED RATE: 60 mm/h — AB (ref 0–22)

## 2014-08-31 MED ORDER — IRBESARTAN 300 MG PO TABS
300.0000 mg | ORAL_TABLET | Freq: Every day | ORAL | Status: DC
  Administered 2014-08-31 – 2014-09-05 (×5): 300 mg via ORAL
  Filled 2014-08-31 (×6): qty 1

## 2014-08-31 MED ORDER — DEXTROSE 5 % IV SOLN
1.0000 g | Freq: Once | INTRAVENOUS | Status: DC
  Filled 2014-08-31: qty 10

## 2014-08-31 MED ORDER — INSULIN GLARGINE 100 UNITS/ML SOLOSTAR PEN: 28.0000 [IU] | PEN_INJECTOR | Freq: Every day | SUBCUTANEOUS | Status: DC

## 2014-08-31 MED ORDER — ACETAMINOPHEN 325 MG PO TABS
650.0000 mg | ORAL_TABLET | Freq: Four times a day (QID) | ORAL | Status: DC | PRN
Start: 2014-08-31 — End: 2014-09-05
  Administered 2014-09-02: 650 mg via ORAL
  Filled 2014-08-31: qty 2

## 2014-08-31 MED ORDER — VANCOMYCIN HCL IN DEXTROSE 1-5 GM/200ML-% IV SOLN
1000.0000 mg | Freq: Once | INTRAVENOUS | Status: AC
  Administered 2014-08-31: 1000 mg via INTRAVENOUS
  Filled 2014-08-31: qty 200

## 2014-08-31 MED ORDER — INSULIN ASPART 100 UNIT/ML ~~LOC~~ SOLN
0.0000 [IU] | Freq: Three times a day (TID) | SUBCUTANEOUS | Status: DC
  Administered 2014-09-01 (×3): 3 [IU] via SUBCUTANEOUS
  Administered 2014-09-02: 5 [IU] via SUBCUTANEOUS
  Administered 2014-09-02 – 2014-09-04 (×6): 3 [IU] via SUBCUTANEOUS
  Administered 2014-09-04 – 2014-09-05 (×4): 2 [IU] via SUBCUTANEOUS

## 2014-08-31 MED ORDER — ONDANSETRON HCL 4 MG/2ML IJ SOLN: 4.0000 mg | Freq: Four times a day (QID) | INTRAMUSCULAR | Status: DC | PRN

## 2014-08-31 MED ORDER — FERROUS GLUCONATE 324 (38 FE) MG PO TABS
324.0000 mg | ORAL_TABLET | Freq: Two times a day (BID) | ORAL | Status: DC
  Administered 2014-08-31 – 2014-09-05 (×10): 324 mg via ORAL
  Filled 2014-08-31 (×12): qty 1

## 2014-08-31 MED ORDER — OMEGA-3-ACID ETHYL ESTERS 1 G PO CAPS
1.0000 g | ORAL_CAPSULE | Freq: Every day | ORAL | Status: DC
  Administered 2014-08-31 – 2014-09-05 (×6): 1 g via ORAL
  Filled 2014-08-31 (×6): qty 1

## 2014-08-31 MED ORDER — HYDROCODONE-ACETAMINOPHEN 5-325 MG PO TABS
1.0000 | ORAL_TABLET | Freq: Four times a day (QID) | ORAL | Status: DC | PRN
  Administered 2014-08-31 – 2014-09-03 (×8): 1 via ORAL
  Filled 2014-08-31 (×8): qty 1

## 2014-08-31 MED ORDER — WARFARIN SODIUM 5 MG PO TABS
5.0000 mg | ORAL_TABLET | ORAL | Status: DC
  Administered 2014-09-01 – 2014-09-04 (×2): 5 mg via ORAL
  Filled 2014-08-31 (×2): qty 1

## 2014-08-31 MED ORDER — ONDANSETRON HCL 4 MG/2ML IJ SOLN
4.0000 mg | Freq: Once | INTRAMUSCULAR | Status: AC
  Administered 2014-08-31: 4 mg via INTRAVENOUS
  Filled 2014-08-31: qty 2

## 2014-08-31 MED ORDER — VITAMIN D3 25 MCG (1000 UNIT) PO TABS
2000.0000 [IU] | ORAL_TABLET | Freq: Every day | ORAL | Status: DC
  Administered 2014-08-31 – 2014-09-05 (×6): 2000 [IU] via ORAL
  Filled 2014-08-31 (×6): qty 2

## 2014-08-31 MED ORDER — VANCOMYCIN HCL IN DEXTROSE 1-5 GM/200ML-% IV SOLN
1000.0000 mg | Freq: Two times a day (BID) | INTRAVENOUS | Status: DC
  Administered 2014-08-31 – 2014-09-01 (×2): 1000 mg via INTRAVENOUS
  Filled 2014-08-31 (×4): qty 200

## 2014-08-31 MED ORDER — ONDANSETRON HCL 4 MG PO TABS: 4.0000 mg | ORAL_TABLET | Freq: Four times a day (QID) | ORAL | Status: DC | PRN

## 2014-08-31 MED ORDER — HYDROMORPHONE HCL 1 MG/ML IJ SOLN
0.5000 mg | INTRAMUSCULAR | Status: DC | PRN
  Administered 2014-08-31 – 2014-09-01 (×5): 0.5 mg via INTRAVENOUS
  Filled 2014-08-31 (×5): qty 1

## 2014-08-31 MED ORDER — ADULT MULTIVITAMIN W/MINERALS CH
1.0000 | ORAL_TABLET | Freq: Every day | ORAL | Status: DC
  Administered 2014-08-31 – 2014-09-05 (×6): 1 via ORAL
  Filled 2014-08-31 (×6): qty 1

## 2014-08-31 MED ORDER — CITALOPRAM HYDROBROMIDE 20 MG PO TABS
20.0000 mg | ORAL_TABLET | Freq: Every day | ORAL | Status: DC
  Administered 2014-08-31 – 2014-09-04 (×5): 20 mg via ORAL
  Filled 2014-08-31 (×6): qty 1

## 2014-08-31 MED ORDER — ACETAMINOPHEN 650 MG RE SUPP: 650.0000 mg | Freq: Four times a day (QID) | RECTAL | Status: DC | PRN

## 2014-08-31 MED ORDER — WARFARIN - PHARMACIST DOSING INPATIENT
Freq: Every day | Status: DC
  Administered 2014-08-31 – 2014-09-04 (×4)

## 2014-08-31 MED ORDER — AMLODIPINE BESYLATE 5 MG PO TABS
5.0000 mg | ORAL_TABLET | Freq: Every day | ORAL | Status: DC
  Administered 2014-08-31 – 2014-09-04 (×4): 5 mg via ORAL
  Filled 2014-08-31 (×6): qty 1

## 2014-08-31 MED ORDER — POLYETHYLENE GLYCOL 3350 17 G PO PACK
17.0000 g | PACK | Freq: Every day | ORAL | Status: DC | PRN
  Administered 2014-09-03 – 2014-09-04 (×2): 17 g via ORAL
  Filled 2014-08-31 (×3): qty 1

## 2014-08-31 MED ORDER — LEVOTHYROXINE SODIUM 50 MCG PO TABS
50.0000 ug | ORAL_TABLET | Freq: Every day | ORAL | Status: DC
  Administered 2014-08-31 – 2014-09-05 (×6): 50 ug via ORAL
  Filled 2014-08-31 (×7): qty 1

## 2014-08-31 MED ORDER — METOPROLOL TARTRATE 50 MG PO TABS
50.0000 mg | ORAL_TABLET | Freq: Two times a day (BID) | ORAL | Status: DC
  Administered 2014-08-31 – 2014-09-05 (×7): 50 mg via ORAL
  Filled 2014-08-31 (×11): qty 1

## 2014-08-31 MED ORDER — DIAZEPAM 5 MG PO TABS: 5.0000 mg | ORAL_TABLET | Freq: Every evening | ORAL | Status: DC | PRN

## 2014-08-31 MED ORDER — HYDROMORPHONE HCL 1 MG/ML IJ SOLN
1.0000 mg | Freq: Once | INTRAMUSCULAR | Status: AC
Start: 2014-08-31 — End: 2014-08-31
  Administered 2014-08-31: 1 mg via INTRAVENOUS
  Filled 2014-08-31: qty 1

## 2014-08-31 MED ORDER — ASPIRIN EC 81 MG PO TBEC
81.0000 mg | DELAYED_RELEASE_TABLET | Freq: Every day | ORAL | Status: DC
  Administered 2014-08-31 – 2014-09-05 (×6): 81 mg via ORAL
  Filled 2014-08-31 (×6): qty 1

## 2014-08-31 MED ORDER — ISOSORBIDE MONONITRATE ER 30 MG PO TB24
30.0000 mg | ORAL_TABLET | Freq: Every day | ORAL | Status: DC
  Administered 2014-08-31 – 2014-09-05 (×5): 30 mg via ORAL
  Filled 2014-08-31 (×6): qty 1

## 2014-08-31 MED ORDER — BISACODYL 10 MG RE SUPP: 10.0000 mg | Freq: Every day | RECTAL | Status: DC | PRN

## 2014-08-31 MED ORDER — WARFARIN SODIUM 7.5 MG PO TABS
7.5000 mg | ORAL_TABLET | ORAL | Status: DC
Start: 2014-08-31 — End: 2014-09-05
  Administered 2014-08-31 – 2014-09-03 (×3): 7.5 mg via ORAL
  Filled 2014-08-31 (×5): qty 1

## 2014-08-31 MED ORDER — INSULIN GLARGINE 100 UNIT/ML ~~LOC~~ SOLN
28.0000 [IU] | Freq: Every day | SUBCUTANEOUS | Status: DC
Start: 2014-08-31 — End: 2014-09-05
  Administered 2014-08-31 – 2014-09-04 (×5): 28 [IU] via SUBCUTANEOUS
  Filled 2014-08-31 (×6): qty 0.28

## 2014-08-31 MED ORDER — DEXTROSE 5 % IV SOLN
1.0000 g | INTRAVENOUS | Status: DC
  Administered 2014-09-01: 1 g via INTRAVENOUS
  Filled 2014-08-31 (×2): qty 10

## 2014-08-31 NOTE — Progress Notes (Signed)
Report received  -Ginger, RN

## 2014-08-31 NOTE — ED Notes (Signed)
Atempted report X's 1.

## 2014-08-31 NOTE — ED Provider Notes (Signed)
CSN: [id number]     Arrival date & time 08/31/14  0847 History   First MD Initiated Contact with Patient 08/31/14 [phone]     Chief Complaint  Patient presents with  . Leg Pain     (Consider location/radiation/quality/duration/timing/severity/associated sxs/prior Treatment) HPI  Pt presenting with c/o diffuse body and joint pain over the past 2 weeks.  She has hx of arthritis.  2 days ago noted redness and increased pain over her fingers and right hand.  Pt also feels more fatigued than her usual.  No fever/chills.  No trauma. Symptoms are constant and worsening.  She also states she has increased left knee pain- knows she has chronic arthritis in that knee, but states the pain is much worse than her baseline.  There are no other associated systemic symptoms, there are no other alleviating or modifying factors.   Past Medical History  Diagnosis Date  . Hypertension   . Depression   . DVT (deep venous thrombosis) 10 years ago    numerous/notes 01/18/2013  . PE (pulmonary thromboembolism) 3 years ag0    3/notes 01/18/2013  . A-fib   . Bleeding on Coumadin 08/2012; 01/18/2013    [facility] admissions (01/19/2013)  . High cholesterol     "been off RX for this at one time" (01/18/2013)  . Heart murmur   . CHF (congestive heart failure)     "2-3 times" (01/19/2013)  . Pneumonia before 2011    "once' (01/18/2013)  . Shortness of breath     "only related to my CHF" (01/18/2013)  . Hypothyroidism   . Type II diabetes mellitus   . OSA (obstructive sleep apnea)     "sent me for test in 04/2012; never ordered mask, etc" (01/19/2013)  . Anemia   . History of blood transfusion 1983; 04/2012    "3 w/ childbirth; hospitalized for pain" (01/19/2013)  . GERD (gastroesophageal reflux disease)   . [id number]     "maybe weekly" (01/19/2013)  . Migraines     "twice/yr maybe" (01/19/2013)  . Arthritis     "right hip; both knees; left wrist/shoulder; back" (01/19/2013"  . Chronic lower back pain    . Gout   . Anxiety   . Renal disorder     kindey function low; "Metformin was destroying my kidneys" (01/19/2013)  . UTI (urinary tract infection) 08/31/2014  . Swelling of hand 08/31/2014    RT HAND   Past Surgical History  Procedure Laterality Date  . Cesarean section  1983  . Cholecystectomy  ~ 2002  . Vena cava filter placement  2011?  [facility] Eye surgery Bilateral     "multiple" (01/18/2013)  . Cataract extraction w/ intraocular lens  implant, bilateral Bilateral 2006-2011  . Pars plana vitrectomy Bilateral 2004-2006    "several" (01/18/2013)  . Pars plana repair of retinal deatachment Right   . Refractive surgery Bilateral     "for stigmatism" (01/18/2013)  . Refractive surgery Left ~ 11/2012    "to puff it up cause vision got hazy" (01/18/2013)  . Colonoscopy N/A 01/21/2013    Procedure: COLONOSCOPY;  Surgeon: Beryle Beams, MD;  Location: [facility];  Service: Endoscopy;  Laterality: N/A;   Family History  Problem Relation Age of Onset  . Cerebral aneurysm Mother   . Hypertension Father   . Cerebral aneurysm Maternal Grandfather   . Cerebral aneurysm Maternal Aunt   . Cancer Maternal Uncle    History  Substance Use Topics  . Smoking status: Former Smoker --  0.05 packs/day for 30 years    Types: Cigarettes  . Smokeless tobacco: Never Used     Comment: 01/19/2013 "quit smoking cigarettes in the early '90's"  . Alcohol Use: Yes     Comment: 01/19/2013 "drank years ago; last drink 03/2012"   OB History    No data available     Review of Systems  ROS reviewed and all otherwise negative except for mentioned in HPI    Allergies  Morphine and related  Home Medications   Prior to Admission medications   Medication Sig Start Date End Date Taking? Authorizing Provider  amLODipine (NORVASC) 5 MG tablet Take 5 mg by mouth daily.   Yes Historical Provider, MD  aspirin EC 81 MG tablet Take 81 mg by mouth daily.   Yes Historical Provider, MD  cholecalciferol (VITAMIN  D) 1000 UNITS tablet Take 2,000 Units by mouth daily.   Yes Lauraine Rinne, MD  citalopram (CELEXA) 20 MG tablet Take 20 mg by mouth at bedtime.   Yes Historical Provider, MD  diazepam (VALIUM) 5 MG tablet Take 1 tablet (5 mg total) by mouth at bedtime as needed for anxiety or sleep (muscle spasms). For muscle spasms. 05/11/12  Yes Ripudeep Krystal Eaton, MD  ferrous gluconate (FERGON) 324 MG tablet Take 1 tablet (324 mg total) by mouth 2 (two) times daily with a meal. 05/11/12  Yes Ripudeep K Rai, MD  furosemide (LASIX) 40 MG tablet Take 80 mg by mouth daily.    Yes Historical Provider, MD  HYDROcodone-acetaminophen (NORCO/VICODIN) 5-325 MG per tablet Take 1 tablet by mouth every 6 (six) hours as needed for pain. 09/08/12  Yes Varney Biles, MD  insulin aspart (NOVOLOG) 100 UNIT/ML injection Inject 10 Units into the skin 3 (three) times daily with meals.   Yes [name], MD  insulin glargine (LANTUS) 100 unit/mL SOPN Inject 28 Units into the skin at bedtime.   Yes Historical Provider, MD  isosorbide mononitrate (IMDUR) 30 MG 24 hr tablet Take 1 tablet (30 mg total) by mouth daily. 05/11/12  Yes Ripudeep Krystal Eaton, MD  levothyroxine (SYNTHROID, LEVOTHROID) 50 MCG tablet Take 50 mcg by mouth daily at 12 noon.   Yes Historical Provider, MD  metoprolol (LOPRESSOR) 50 MG tablet Take 50 mg by mouth 2 (two) times daily.   Yes Historical Provider, MD  Multiple Vitamin (MULITIVITAMIN WITH MINERALS) TABS Take 1 tablet by mouth daily.   Yes Historical Provider, MD  olmesartan (BENICAR) 40 MG tablet Take 40 mg by mouth daily.    Yes Historical Provider, MD  Omega-3 Fatty Acids (FISH OIL) 1200 MG CAPS Take 1 capsule by mouth 2 (two) times daily.   Yes Historical Provider, MD  warfarin (COUMADIN) 2.5 MG tablet Take 7.5 mg by mouth daily except 5 mg on Mon/Wed/Fri or as directed. 04/14/14  Yes Lennis P Livesay, MD   BP 140/69 mmHg  Pulse 78  Temp(Src) 99.1 F (37.3 C) (Oral)  Resp 20  Ht 5\' 3"  (1.6 m)  Wt 288 lb 9.6 oz  (130.908 kg)  BMI 51.14 kg/m2  SpO2 96%  Vitals reviewed Physical Exam  Physical Examination: General appearance - alert, well appearing, and in no distress Mental status - alert, oriented to person, place, and time Eyes - pupils equal and reactive, no conjunctival injection Mouth - mucous membranes moist, pharynx normal without lesions Chest - clear to auscultation, no wheezes, rales or rhonchi, symmetric air entry Heart - normal rate, regular rhythm, normal S1, S2, no murmurs,  rubs, clicks or gallops Abdomen - soft, nontender, nondistended, no masses or organomegaly Neurological - alert, oriented x 3 Musculoskeletal -erythema of right hand- over pip and MP joints of 4th finger, some spreading onto dorsum of right hand, pain with movement of 4th finger at PIP joint, fingers are distally NVI with brisk cap refill., also ttp over lateral left knee with pain with ROM of knee- no swelling or deformity, no ttp in popliteal fossa or calf.  Extremities - peripheral pulses normal, no pedal edema, no clubbing or cyanosis Skin - normal coloration and turgor, other than right hand as noted above  ED Course  Procedures (including critical care time)  1:57 PM d/w hand surgery, Dr. Grandville Silos, he feels her symptoms more likely represent rheumatologic disease.   2:13 PM blood cultures added in case of hematologic spread to hand- no break in the skin to suggest primary cellulitis.  D/w [facility]ad for admission, they are writing the orders now for med/surg bed.  Labs Review Labs Reviewed  CBC - Abnormal; Notable for the following:    RBC 3.84 (*)    Hemoglobin 11.0 (*)    HCT 35.0 (*)    Platelets 133 (*)    All other components within normal limits  BASIC METABOLIC PANEL - Abnormal; Notable for the following:    Glucose, Bld 207 (*)    BUN 34 (*)    Creatinine, Ser 1.38 (*)    GFR calc non Af Amer 41 (*)    GFR calc Af Amer 48 (*)    All other components within normal limits  SEDIMENTATION RATE -  Abnormal; Notable for the following:    Sed Rate 60 (*)    All other components within normal limits  URINALYSIS, ROUTINE W REFLEX MICROSCOPIC (NOT AT [facility]) - Abnormal; Notable for the following:    APPearance CLOUDY (*)    Glucose, UA 100 (*)    Hgb urine dipstick LARGE (*)    Protein, ur >300 (*)    Leukocytes, UA LARGE (*)    All other components within normal limits  C-REACTIVE PROTEIN - Abnormal; Notable for the following:    CRP 6.8 (*)    All other components within normal limits  URINE MICROSCOPIC-ADD ON - Abnormal; Notable for the following:    Squamous Epithelial / LPF FEW (*)    Bacteria, UA MANY (*)    All other components within normal limits  PROTIME-INR - Abnormal; Notable for the following:    Prothrombin Time 26.7 (*)    INR 2.50 (*)    All other components within normal limits  HEMOGLOBIN A1C - Abnormal; Notable for the following:    Hgb A1c MFr Bld 6.2 (*)    All other components within normal limits  BASIC METABOLIC PANEL - Abnormal; Notable for the following:    Chloride 100 (*)    Glucose, Bld 206 (*)    BUN 34 (*)    Creatinine, Ser 1.67 (*)    Calcium 8.6 (*)    GFR calc non Af Amer 33 (*)    GFR calc Af Amer 38 (*)    All other components within normal limits  CBC - Abnormal; Notable for the following:    RBC 3.55 (*)    Hemoglobin 10.2 (*)    HCT 32.8 (*)    Platelets 135 (*)    All other components within normal limits  GLUCOSE, CAPILLARY - Abnormal; Notable for the following:    Glucose-Capillary 204 (*)  All other components within normal limits  GLUCOSE, CAPILLARY - Abnormal; Notable for the following:    Glucose-Capillary 186 (*)    All other components within normal limits  GLUCOSE, CAPILLARY - Abnormal; Notable for the following:    Glucose-Capillary 179 (*)    All other components within normal limits  GLUCOSE, CAPILLARY - Abnormal; Notable for the following:    Glucose-Capillary 188 (*)    All other components within normal limits   GLUCOSE, CAPILLARY - Abnormal; Notable for the following:    Glucose-Capillary 169 (*)    All other components within normal limits  CBC - Abnormal; Notable for the following:    RBC 3.76 (*)    Hemoglobin 10.6 (*)    HCT 34.5 (*)    Platelets 132 (*)    All other components within normal limits  BASIC METABOLIC PANEL - Abnormal; Notable for the following:    Sodium 134 (*)    Chloride 99 (*)    Glucose, Bld 208 (*)    BUN 41 (*)    Creatinine, Ser 1.97 (*)    Calcium 8.7 (*)    GFR calc non Af Amer 27 (*)    GFR calc Af Amer 31 (*)    All other components within normal limits  GLUCOSE, CAPILLARY - Abnormal; Notable for the following:    Glucose-Capillary 173 (*)    All other components within normal limits  GLUCOSE, CAPILLARY - Abnormal; Notable for the following:    Glucose-Capillary 159 (*)    All other components within normal limits  PROTIME-INR - Abnormal; Notable for the following:    Prothrombin Time 30.4 (*)    INR 2.98 (*)    All other components within normal limits  GLUCOSE, CAPILLARY - Abnormal; Notable for the following:    Glucose-Capillary 191 (*)    All other components within normal limits  GLUCOSE, CAPILLARY - Abnormal; Notable for the following:    Glucose-Capillary 236 (*)    All other components within normal limits  URIC ACID - Abnormal; Notable for the following:    Uric Acid, Serum 9.3 (*)    All other components within normal limits  PROTIME-INR - Abnormal; Notable for the following:    Prothrombin Time 29.2 (*)    INR 2.82 (*)    All other components within normal limits  GLUCOSE, CAPILLARY - Abnormal; Notable for the following:    Glucose-Capillary 192 (*)    All other components within normal limits  GLUCOSE, CAPILLARY - Abnormal; Notable for the following:    Glucose-Capillary 218 (*)    All other components within normal limits  CBG MONITORING, ED - Abnormal; Notable for the following:    Glucose-Capillary 200 (*)    All other  components within normal limits  CULTURE, BLOOD (ROUTINE X 2)  CULTURE, BLOOD (ROUTINE X 2)  RHEUMATOID FACTOR  CYCLIC CITRUL PEPTIDE ANTIBODY, IGG    Imaging Review Mr Hand Right Wo Contrast  09/02/2014   CLINICAL DATA:  Swelling over the dorsal aspect of the right hand and wrist with decreased mobility of the fingers. Symptoms for approximately 48 hours.  EXAM: MRI OF THE RIGHT HAND WITHOUT CONTRAST  TECHNIQUE: Multiplanar, multisequence MR imaging was performed. No intravenous contrast was administered.  COMPARISON:  Plain films the right hand 08/31/2014.  FINDINGS: Subcutaneous edema about the hand is worst over the dorsum. Mild edema is also seen within intrinsic musculature of the hand. There is no focal fluid collection.  The flexor and  extensor tendons are intact. A small amount of fluid is seen in the sheath of the extensor carpi ulnaris and there is mildly increased T2 signal within the substance of the tendon. The carpal tunnel and median nerve are unremarkable. A small effusion is seen in the distal radioulnar joint. Synovium about the dorsal aspect of the carpus appears thickened. The triangular fibrocartilage appears intact. The scapholunate and lunotriquetral ligaments are unremarkable.  There appears to be an erosion in the ulnar styloid. T2 hyperintense foci are seen in the heads of the third and fourth metacarpals, larger in the third metacarpal head. Bone marrow signal is otherwise unremarkable.  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 The appearance is not typical of infection.   Electronically Signed   By: Inge Rise M.D.   On: 09/02/2014 09:02     EKG Interpretation   Date/Time:  Thursday Aug 31 2014 09:20:06 EDT Ventricular Rate:  90 PR Interval:    QRS Duration: 145 QT Interval:  387 QTC Calculation: 473 R Axis:   -103 Text Interpretation:  Atrial  flutter with predominant 3:1 AV block versus  sinus arrythmia with baseline artifact Nonspecific IVCD with LAD Consider  anterior infarct Baseline wander in lead(s) V2 Confirmed by Canary Brim  MD,  Walaa Carel [phone]) on 08/31/2014 3:07:58 PM      MDM   Final diagnoses:  Swelling  UTI Joint pain  Pt presenting with c/o hand/finger swelling and pain, worsening left knee pain along with fatigue and malaise.  Pt found to have UTI- started on rocephin for this.  Started on vanc for possibility of cellulitis of right hand- xray shows more likely rheumatologic disease- d/w Dr. Grandville Silos, hand who does not feel this is infectious.  Pt admitted to triad for further evaluation and management.      Alfonzo Beers, MD 09/03/14 0800

## 2014-08-31 NOTE — ED Notes (Signed)
Pt in X ray

## 2014-08-31 NOTE — Progress Notes (Signed)
Received report from [facility]

## 2014-08-31 NOTE — Progress Notes (Signed)
ANTICOAGULATION and ANTIBIOTIC CONSULT NOTE - Initial Consult  Pharmacy Consult for vancomycin and coumadin Indication: hand infection and vte treatment  Allergies  Allergen Reactions  . Morphine And Related Rash    Patient Measurements: Height: 5\' 3"  (160 cm) Weight: 286 lb (129.729 kg) IBW/kg (Calculated) : 52.4   Vital Signs: Temp: 98 F (36.7 C) (05/26 0901) Temp Source: Oral (05/26 0901) BP: 129/58 mmHg (05/26 1438) Pulse Rate: 81 (05/26 1438)  Labs:  Recent Labs  08/31/14 1114 08/31/14 1333  HGB 11.0*  --   HCT 35.0*  --   PLT 133*  --   LABPROT  --  26.7*  INR  --  2.50*  CREATININE 1.38*  --     Estimated Creatinine Clearance: 58.4 mL/min (by C-G formula based on Cr of 1.38).   Medical History: Past Medical History  Diagnosis Date  . Hypertension   . Depression   . DVT (deep venous thrombosis) 10 years ago    numerous/notes 01/18/2013  . PE (pulmonary thromboembolism) 3 years ag0    3/notes 01/18/2013  . A-fib   . Bleeding on Coumadin 08/2012; 01/18/2013    [facility] admissions (01/19/2013)  . High cholesterol     "been off RX for this at one time" (01/18/2013)  . Heart murmur   . CHF (congestive heart failure)     "2-3 times" (01/19/2013)  . Pneumonia before 2011    "once' (01/18/2013)  . Shortness of breath     "only related to my CHF" (01/18/2013)  . Hypothyroidism   . Type II diabetes mellitus   . OSA (obstructive sleep apnea)     "sent me for test in 04/2012; never ordered mask, etc" (01/19/2013)  . Anemia   . History of blood transfusion 1983; 04/2012    "3 w/ childbirth; hospitalized for pain" (01/19/2013)  . GERD (gastroesophageal reflux disease)   . [id number]     "maybe weekly" (01/19/2013)  . Migraines     "twice/yr maybe" (01/19/2013)  . Arthritis     "right hip; both knees; left wrist/shoulder; back" (01/19/2013"  . Chronic lower back pain   . Gout   . Anxiety   . Renal disorder     kindey function low; "Metformin was  destroying my kidneys" (01/19/2013)   Assessment: 59 yo F with PMH multiple DVTs/PEs s/p IVC filter, OA, obesity, DM, HTN, HLD in ED with multiple complaints including generalized weakness, worsening L knee pain, R hand swelling.  R hand x-ray showing deformity suspicious for rheumatoid arthritis.  Pharmacy to dose vancomycin for suspicion of R hand cellulitis. Rocephin given for UTI per urinalysis. Pharmacy consulted to continue home coumadin dosing for hx of DVTs/PEs and Afib.   Wt 129.7 kg; WBC 7.2, creat 1.38; AF. Sed rate elevated at 60. Vanc 1 gm given in ED at 1332  vanc (cellulitis R hand) 5/26>> Ceftriaxone (UTI)  5/26>>  5/26 BCx2>>  Coumadin Clinic for Dr. Marko Plume pt: last coumadin clinic visit 07/24/14: on 7.5 mg daily x 5 mg MWF.  INR was 2.2 on 07/24/14.  5/26 admission INR therapeutic at 2.5.  H/H 11/35 PLTC low at 133. Past PLTC [phone].   Current dose is 7.5 mg daily and 5 mg MWF.  Last dose taken 5/25.   Goal of Therapy:  INR 2-3 Monitor platelets by anticoagulation protocol: Yes  Vancomycin trough 10-15 mcg/ml for cellulitis   Plan:  - give another vancomycin 1 gm dose to make a total loading dose of 2 gm,  then vancomycin 1 gm IV q12 per obesity dosing nomogram -continue home coumadin dose of 7.5 mg daily x 5 mg MWF. -daily INR -f/u renal fxn, wbc, temp, clinical course -drug levels as needed  Eudelia Bunch, Pharm.D. [phone] 08/31/2014 3:31 PM

## 2014-08-31 NOTE — ED Notes (Signed)
Per ems-- pt c/o pain to all extremities x 2 weeks. Pt concerned she has blood clots. No swelling to extremities, PMS intact, no discoloration, no sob.

## 2014-08-31 NOTE — Telephone Encounter (Signed)
Pt called to let us know she was in hospital  She will call us on DC to reschedule her appmt

## 2014-08-31 NOTE — H&P (Signed)
Patient Demographics  [name], is a 59 y.o. female  MRN: [id number]   DOB - March 03, 1956  Admit Date - 08/31/2014  Outpatient Primary MD for the patient is [name], MD   With History of -  Past Medical History  Diagnosis Date  . Hypertension   . Depression   . DVT (deep venous thrombosis) 10 years ago    numerous/notes 01/18/2013  . PE (pulmonary thromboembolism) 3 years ag0    3/notes 01/18/2013  . A-fib   . Bleeding on Coumadin 08/2012; 01/18/2013    [facility] admissions (01/19/2013)  . High cholesterol     "been off RX for this at one time" (01/18/2013)  . Heart murmur   . CHF (congestive heart failure)     "2-3 times" (01/19/2013)  . Pneumonia before 2011    "once' (01/18/2013)  . Shortness of breath     "only related to my CHF" (01/18/2013)  . Hypothyroidism   . Type II diabetes mellitus   . OSA (obstructive sleep apnea)     "sent me for test in 04/2012; never ordered mask, etc" (01/19/2013)  . Anemia   . History of blood transfusion 1983; 04/2012    "3 w/ childbirth; hospitalized for pain" (01/19/2013)  . GERD (gastroesophageal reflux disease)   . [id number]     "maybe weekly" (01/19/2013)  . Migraines     "twice/yr maybe" (01/19/2013)  . Arthritis     "right hip; both knees; left wrist/shoulder; back" (01/19/2013"  . Chronic lower back pain   . Gout   . Anxiety   . Renal disorder     kindey function low; "Metformin was destroying my kidneys" (01/19/2013)      Past Surgical History  Procedure Laterality Date  . Cesarean section  1983  . Cholecystectomy  ~ 2002  . Vena cava filter placement  2011?  [facility] surgery Bilateral     "multiple" (01/18/2013)  . Cataract extraction w/ intraocular lens  implant, bilateral Bilateral 2006-2011  . Pars plana vitrectomy Bilateral 2004-2006    "several" (01/18/2013)  . Pars plana repair of retinal deatachment Right   . Refractive surgery Bilateral     "for stigmatism" (01/18/2013)  . Refractive  surgery Left ~ 11/2012    "to puff it up cause vision got hazy" (01/18/2013)  . Colonoscopy N/A 01/21/2013    Procedure: COLONOSCOPY;  Surgeon: Beryle Beams, MD;  Location: [facility];  Service: Endoscopy;  Laterality: N/A;    in for   Chief Complaint  Patient presents with  . Leg Pain     HPI  [name]  is a 59 y.o. 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, osteoarthritis, obesity, diabetes mellitus, hypertension, hyperlipidemia, presents with multiple complaints, including generalized weakness over the last few days, worsening left knee pain, and right hand swelling, patient reports she has chronic left knee arthritis, ambulates with cane or walker at baseline, the pain has progressively worsened recently, as well patient developed right hand swelling and erythema over the last 48 hours, she denies any fever or chills, patient was afebrile in ED, no leukocytosis, left knee x-ray showing arthritis, right hand x-ray showing deformity suspicious for rheumatoid arthritis, patient denies any history of rheumatoid arthritis, was never seen by rheumatology as well, urinalysis was positive, patient was started on Rocephin, given one dose of IV vancomycin for suspicious of an cellulitis, ED physician discussed with hand surgery on call Dr. Grandville Silos who  felt her symptoms are more likely related to rheumatologic disease.    Review of Systems    In addition to the HPI above,  No Fever-chills, No Headache, No changes with Vision or hearing, No problems swallowing food or Liquids, No Chest pain, Cough or Shortness of Breath, No Abdominal pain, No Nausea or Vommitting, Bowel movements are regular, No Blood in stool or Urine, No dysuria, No new skin rashes or brucomplaints of worsening left knee pain, and right hand swelling and erythema. No new weakness, tingling, numbness in any extremity, No recent weight gain or loss, No polyuria,  polydypsia or polyphagia, No significant Mental Stressors.  A full 10 point Review of Systems was done, except as stated above, all other Review of Systems were negative.   Social History History  Substance Use Topics  . Smoking status: Former Smoker -- 0.05 packs/day for 30 years    Types: Cigarettes  . Smokeless tobacco: Never Used     Comment: 01/19/2013 "quit smoking cigarettes in the early '90's"  . Alcohol Use: Yes     Comment: 01/19/2013 "drank years ago; last drink 03/2012"     Family History Family History  Problem Relation Age of Onset  . Cerebral aneurysm Mother   . Hypertension Father   . Cerebral aneurysm Maternal Grandfather   . Cerebral aneurysm Maternal Aunt   . Cancer Maternal Uncle     Prior to Admission medications   Medication Sig Start Date End Date Taking? Authorizing Provider  amLODipine (NORVASC) 5 MG tablet Take 5 mg by mouth daily.   Yes Historical Provider, MD  aspirin EC 81 MG tablet Take 81 mg by mouth daily.   Yes Historical Provider, MD  cholecalciferol (VITAMIN D) 1000 UNITS tablet Take 2,000 Units by mouth daily.   Yes Lauraine Rinne, MD  citalopram (CELEXA) 20 MG tablet Take 20 mg by mouth at bedtime.   Yes Historical Provider, MD  diazepam (VALIUM) 5 MG tablet Take 1 tablet (5 mg total) by mouth at bedtime as needed for anxiety or sleep (muscle spasms). For muscle spasms. 05/11/12  Yes Ripudeep Krystal Eaton, MD  ferrous gluconate (FERGON) 324 MG tablet Take 1 tablet (324 mg total) by mouth 2 (two) times daily with a meal. 05/11/12  Yes Ripudeep K Rai, MD  furosemide (LASIX) 40 MG tablet Take 80 mg by mouth daily.    Yes Historical Provider, MD  HYDROcodone-acetaminophen (NORCO/VICODIN) 5-325 MG per tablet Take 1 tablet by mouth every 6 (six) hours as needed for pain. 09/08/12  Yes Varney Biles, MD  insulin aspart (NOVOLOG) 100 UNIT/ML injection Inject 10 Units into the skin 3 (three) times daily with meals.   Yes [name], MD  insulin glargine  (LANTUS) 100 unit/mL SOPN Inject 28 Units into the skin at bedtime.   Yes Historical Provider, MD  isosorbide mononitrate (IMDUR) 30 MG 24 hr tablet Take 1 tablet (30 mg total) by mouth daily. 05/11/12  Yes Ripudeep Krystal Eaton, MD  levothyroxine (SYNTHROID, LEVOTHROID) 50 MCG tablet Take 50 mcg by mouth daily at 12 noon.   Yes Historical Provider, MD  metoprolol (LOPRESSOR) 50 MG tablet Take 50 mg by mouth 2 (two) times daily.   Yes Historical Provider, MD  Multiple Vitamin (MULITIVITAMIN WITH MINERALS) TABS Take 1 tablet by mouth daily.   Yes Historical Provider, MD  olmesartan (BENICAR) 40 MG tablet Take 40 mg by mouth daily.    Yes Historical Provider, MD  Omega-3 Fatty Acids (FISH OIL) 1200 MG  CAPS Take 1 capsule by mouth 2 (two) times daily.   Yes Historical Provider, MD  warfarin (COUMADIN) 2.5 MG tablet Take 7.5 mg by mouth daily except 5 mg on Mon/Wed/Fri or as directed. 04/14/14  Yes Lennis Marion Downer, MD    Allergies  Allergen Reactions  . Morphine And Related Rash    Physical Exam  Vitals  Blood pressure 126/61, pulse 45, temperature 98 F (36.7 C), temperature source Oral, resp. rate 16, height 5\' 3"  (1.6 m), weight 129.729 kg (286 lb), SpO2 86 %.   1. General  obese female  lying in bed in NAD,   2. Normal affect and insight, Not Suicidal or Homicidal, Awake Alert, Oriented X 3.  3. No F.N deficits, ALL C.Nerves Intact, Strength 5/5 all 4 extremities, Sensation intact all 4 extremities, Plantars down going.  4. Ears and Eyes appear Normal, Conjunctivae clear, PERRLA. Moist Oral Mucosa.  5. Supple Neck, No JVD, No cervical lymphadenopathy appriciated, No Carotid Bruits.  6. Symmetrical Chest wall movement, Good air movement bilaterally, CTAB.  7. RRR, No Gallops, Rubs or Murmurs, No Parasternal Heave.  8. Positive Bowel Sounds, Abdomen Soft, No tenderness, No organomegaly appriciated,No rebound -guarding or rigidity.  9.  No Cyanosis, Normal Skin Turgor, chronic lower  extremity skin discoloration and changes.  10.  Good muscle tone,  right hand mild swelling and erythema in the metacarpal area of third and fourth digits .  11. No Palpable Lymph Nodes in Neck or Axillae    Data Review  CBC  Recent Labs Lab 08/31/14 1114  WBC 7.2  HGB 11.0*  HCT 35.0*  PLT 133*  MCV 91.1  MCH 28.6  MCHC 31.4  RDW 15.1   ------------------------------------------------------------------------------------------------------------------  Chemistries   Recent Labs Lab 08/31/14 1114  NA 139  K 4.0  CL 103  CO2 26  GLUCOSE 207*  BUN 34*  CREATININE 1.38*  CALCIUM 9.0   ------------------------------------------------------------------------------------------------------------------ estimated creatinine clearance is 58.4 mL/min (by C-G formula based on Cr of 1.38). ------------------------------------------------------------------------------------------------------------------ No results for input(s): TSH, T4TOTAL, T3FREE, THYROIDAB in the last 72 hours.  Invalid input(s): FREET3   Coagulation profile  Recent Labs Lab 08/31/14 1333  INR 2.50*   ------------------------------------------------------------------------------------------------------------------- No results for input(s): DDIMER in the last 72 hours. -------------------------------------------------------------------------------------------------------------------  Cardiac Enzymes No results for input(s): CKMB, TROPONINI, MYOGLOBIN in the last 168 hours.  Invalid input(s): CK ------------------------------------------------------------------------------------------------------------------ Invalid input(s): POCBNP   ---------------------------------------------------------------------------------------------------------------  Urinalysis    Component Value Date/Time   COLORURINE YELLOW 08/31/2014 1158   APPEARANCEUR CLOUDY* 08/31/2014 1158   LABSPEC 1.014 08/31/2014 1158     PHURINE 6.0 08/31/2014 1158   GLUCOSEU 100* 08/31/2014 1158   HGBUR LARGE* 08/31/2014 1158   BILIRUBINUR NEGATIVE 08/31/2014 1158   KETONESUR NEGATIVE 08/31/2014 1158   PROTEINUR >300* 08/31/2014 1158   UROBILINOGEN 0.2 08/31/2014 1158   NITRITE NEGATIVE 08/31/2014 1158   LEUKOCYTESUR LARGE* 08/31/2014 1158    ----------------------------------------------------------------------------------------------------------------  Imaging results:   Dg Knee Complete 4 Views Left  08/31/2014   CLINICAL DATA:  Left lateral knee pain and swelling, cellulitis  EXAM: LEFT KNEE - COMPLETE 4+ VIEW  COMPARISON:  01/08/2009  FINDINGS: Four views of the left knee submitted. No acute fracture or subluxation. There is narrowing of lateral joint compartment. Mild spurring of lateral femoral condyle and lateral tibial plateau. Small joint effusion. Mild narrowing of patellofemoral joint space.  IMPRESSION: No acute fracture or subluxation. Osteoarthritic changes as described above.   Electronically Signed   By: Julien Girt  Pop M.D.   On: 08/31/2014 12:56   Dg Hand Complete Right  08/31/2014   CLINICAL DATA:  Right hand swelling with redness along fourth metacarpal. No recent trauma  EXAM: RIGHT HAND - COMPLETE 3+ VIEW  COMPARISON:  None.  FINDINGS: Frontal, oblique, and lateral views obtained. There is marked soft tissue swelling in all PIP joints as well as in the first MCP joint. There is no fracture or dislocation. There are persistent flexion deformities at all PIP joints. There is no appreciable joint space narrowing. No erosive change or periostitis. Calcification is noted in the triangular fibrocartilage region, well corticated.  IMPRESSION: Flexion deformities at all PIP joints with marked soft tissue swelling in all PIP joints. There is also swelling in the first MCP joint. No appreciable joint space narrowing or erosive change. The soft tissue swelling is felt to be of arthropathic etiology ; rheumatoid  arthritis is of particular concern given this appearance. No fracture or dislocation. Calcification in the triangular fibrocartilage region is of uncertain etiology and potentially may represent previous trauma.   Electronically Signed   By: Lowella Grip III M.D.   On: 08/31/2014 12:55        Assessment & Plan  Active Problems:   HTN (hypertension)   HLD (hyperlipidemia)   Anemia - due to GI bleed & chronic disease.   History of pulmonary embolism, recurrent, 3 seperate episodes.   Diastolic dysfunction, grade 2, EF 65-70% May 2013   Chronic renal insufficiency, stage IV (severe) - due to DM & HTN, SCr improving-1.6   UTI (lower urinary tract infection)   Osteoarthritis   Cellulitis of hand, right    Generalized weakness - Patient will be hydrated gently, will treat her UTI, will consult physical therapy.  UTI - Continue with Rocephin, follow on urine cultures.  Right hand cellulitis - Unclear if this is related to infectious process or rheumatologic process, ED discussed with hand surgery Dr. Grandville Silos, at this point will obtain MRI of right hand, if evidence of infection will consult hand surgery, will check rheumatoid factor, anti-CCP, will call her with vancomycin pending MRI results.  Hypertension - Continue with home medication  Anemia  - Continue with iron supplements    chronic renal insufficiency stage IV - At baseline, continue to monitor  Osteoarthritis - Left knee x-ray showing osteoarthritis, will consult physical therapy.  Multiple DVTs and PEs in the past - Continue with warfarin, pharmacy to dose. - Status post IVC filter  Diastolic dysfunction - Patient appears euvolemic -  resume Lasix when appropriate.  DVT Prophylaxis on warfarin  AM Labs Ordered, also please review Full Orders  Family Communication: Admission, patients condition and plan of care including tests being ordered have been discussed with the patient who indicate understanding  and agree with the plan and Code Status.  Code Status   Likely DC to   pending PT evaluation   Condition GUARDED    Time spent in minutes : 55 minutes    ELGERGAWY, DAWOOD M.D on 08/31/2014 at 2:43 PM  Between 7am to 7pm - Pager - [phone]  After 7pm go to [email or web address] - password [email or web address]  And look for the night coverage person covering me after hours  [facility] Office  [phone]

## 2014-09-01 ENCOUNTER — Inpatient Hospital Stay (HOSPITAL_COMMUNITY): Payer: Medicare HMO

## 2014-09-01 DIAGNOSIS — L03113 Cellulitis of right upper limb: Secondary | ICD-10-CM

## 2014-09-01 DIAGNOSIS — I10 Essential (primary) hypertension: Secondary | ICD-10-CM

## 2014-09-01 DIAGNOSIS — Z86711 Personal history of pulmonary embolism: Secondary | ICD-10-CM

## 2014-09-01 DIAGNOSIS — E785 Hyperlipidemia, unspecified: Secondary | ICD-10-CM

## 2014-09-01 LAB — CBC
HCT: 32.8 % — ABNORMAL LOW (ref 36.0–46.0)
HEMOGLOBIN: 10.2 g/dL — AB (ref 12.0–15.0)
MCH: 28.7 pg (ref 26.0–34.0)
MCHC: 31.1 g/dL (ref 30.0–36.0)
MCV: 92.4 fL (ref 78.0–100.0)
Platelets: 135 10*3/uL — ABNORMAL LOW (ref 150–400)
RBC: 3.55 MIL/uL — ABNORMAL LOW (ref 3.87–5.11)
RDW: 15.2 % (ref 11.5–15.5)
WBC: 6.8 10*3/uL (ref 4.0–10.5)

## 2014-09-01 LAB — BASIC METABOLIC PANEL
Anion gap: 7 (ref 5–15)
BUN: 34 mg/dL — ABNORMAL HIGH (ref 6–20)
CO2: 28 mmol/L (ref 22–32)
Calcium: 8.6 mg/dL — ABNORMAL LOW (ref 8.9–10.3)
Chloride: 100 mmol/L — ABNORMAL LOW (ref 101–111)
Creatinine, Ser: 1.67 mg/dL — ABNORMAL HIGH (ref 0.44–1.00)
GFR calc non Af Amer: 33 mL/min — ABNORMAL LOW (ref >60)
GFR, EST AFRICAN AMERICAN: 38 mL/min — AB (ref >60)
Glucose, Bld: 206 mg/dL — ABNORMAL HIGH (ref 65–99)
POTASSIUM: 4.4 mmol/L (ref 3.5–5.1)
Sodium: 135 mmol/L (ref 135–145)

## 2014-09-01 LAB — GLUCOSE, CAPILLARY
GLUCOSE-CAPILLARY: 188 mg/dL — AB (ref 65–99)
GLUCOSE-CAPILLARY: 173 mg/dL — AB (ref 65–99)
GLUCOSE-CAPILLARY: 159 mg/dL — AB (ref 65–99)
Glucose-Capillary: 169 mg/dL — ABNORMAL HIGH (ref 65–99)

## 2014-09-01 LAB — RHEUMATOID FACTOR: RHEUMATOID FACTOR: 9.4 [IU]/mL (ref 0.0–13.9)

## 2014-09-01 LAB — HEMOGLOBIN A1C
Hgb A1c MFr Bld: 6.2 % — ABNORMAL HIGH (ref 4.8–5.6)
Mean Plasma Glucose: 131 mg/dL

## 2014-09-01 MED ORDER — LORAZEPAM 2 MG/ML IJ SOLN
1.0000 mg | Freq: Four times a day (QID) | INTRAMUSCULAR | Status: DC | PRN
  Administered 2014-09-01 – 2014-09-03 (×4): 1 mg via INTRAVENOUS
  Filled 2014-09-01 (×4): qty 1

## 2014-09-01 MED ORDER — VANCOMYCIN HCL 10 G IV SOLR
1500.0000 mg | INTRAVENOUS | Status: DC
  Administered 2014-09-01: 1500 mg via INTRAVENOUS
  Filled 2014-09-01 (×3): qty 1500

## 2014-09-01 MED ORDER — HYDROMORPHONE HCL 1 MG/ML IJ SOLN
1.0000 mg | INTRAMUSCULAR | Status: DC | PRN
  Administered 2014-09-01 – 2014-09-03 (×6): 1 mg via INTRAVENOUS
  Filled 2014-09-01 (×7): qty 1

## 2014-09-01 NOTE — Progress Notes (Signed)
ANTICOAGULATION and ANTIBIOTIC CONSULT NOTE   Pharmacy Consult for vancomycin and coumadin Indication: hand infection and vte treatment  Allergies  Allergen Reactions  . Morphine And Related Rash    Patient Measurements: Height: 5\' 3"  (160 cm) Weight: 288 lb 9.6 oz (130.908 kg) IBW/kg (Calculated) : 52.4   Vital Signs: Temp: 98.7 F (37.1 C) (05/27 0519) Temp Source: Oral (05/27 0519) BP: 141/61 mmHg (05/27 0933) Pulse Rate: 97 (05/27 0933)  Labs:  Recent Labs  08/31/14 1114 08/31/14 1333 09/01/14 0650  HGB 11.0*  --  10.2*  HCT 35.0*  --  32.8*  PLT 133*  --  135*  LABPROT  --  26.7*  --   INR  --  2.50*  --   CREATININE 1.38*  --  1.67*    Estimated Creatinine Clearance: 48.6 mL/min (by C-G formula based on Cr of 1.67).   Medical History: Past Medical History  Diagnosis Date  . Hypertension   . Depression   . DVT (deep venous thrombosis) 10 years ago    numerous/notes 01/18/2013  . PE (pulmonary thromboembolism) 3 years ag0    3/notes 01/18/2013  . A-fib   . Bleeding on Coumadin 08/2012; 01/18/2013    [facility] admissions (01/19/2013)  . High cholesterol     "been off RX for this at one time" (01/18/2013)  . Heart murmur   . CHF (congestive heart failure)     "2-3 times" (01/19/2013)  . Pneumonia before 2011    "once' (01/18/2013)  . Shortness of breath     "only related to my CHF" (01/18/2013)  . Hypothyroidism   . Type II diabetes mellitus   . OSA (obstructive sleep apnea)     "sent me for test in 04/2012; never ordered mask, etc" (01/19/2013)  . Anemia   . History of blood transfusion 1983; 04/2012    "3 w/ childbirth; hospitalized for pain" (01/19/2013)  . GERD (gastroesophageal reflux disease)   . [id number]     "maybe weekly" (01/19/2013)  . Migraines     "twice/yr maybe" (01/19/2013)  . Arthritis     "right hip; both knees; left wrist/shoulder; back" (01/19/2013"  . Chronic lower back pain   . Gout   . Anxiety   . Renal disorder      kindey function low; "Metformin was destroying my kidneys" (01/19/2013)  . UTI (urinary tract infection) 08/31/2014  . Swelling of hand 08/31/2014    RT HAND   Assessment: 59 yo F with PMH multiple DVTs/PEs s/p IVC filter, OA, obesity, DM, HTN, HLD in ED with multiple complaints including generalized weakness, worsening L knee pain, R hand swelling.  R hand x-ray showing deformity suspicious for rheumatoid arthritis.  Pharmacy to dose vancomycin for suspicion of R hand cellulitis. Rocephin given for UTI per urinalysis. Pharmacy consulted to continue home coumadin dosing for hx of DVTs/PEs and Afib.    SrCr increased today to 1.67, INR was therapeutic on admission and home dose continued  Goal of Therapy:  INR 2-3 Monitor platelets by anticoagulation protocol: Yes  Vancomycin trough 10-15 mcg/ml for cellulitis   Plan:  - Change vanc to 1500 mg IV q24 hours (obesity dosing nomogram) -continue home coumadin dose of 7.5 mg daily x 5 mg MWF. -daily INR -f/u renal fxn, wbc, temp, clinical course -drug levels as needed  Thanks for allowing pharmacy to be a part of this patient's care.  Excell Seltzer, PharmD Clinical Pharmacist, [phone] 09/01/2014 10:53 AM

## 2014-09-01 NOTE — Evaluation (Signed)
Physical Therapy Evaluation Patient Details Name: [name] MRN: [id number] DOB: 21-Nov-1955 Today's Date: 09/01/2014   History of Present Illness  Pt adm with rt hand pain, redness and swelling. Pt also c/o weakness and found to have UTI. Pt is also c/o lt lower leg pain.  Clinical Impression  Pt admitted with above diagnosis and presents to PT with functional limitations due to deficits listed below (See PT problem list). Pt needs skilled PT to maximize independence and safety to allow discharge to least restrictive environment. If pain resolves fairly quickly expect pt's mobility will be adequate to return home but if pain persist pt may need ST-SNF.     Follow Up Recommendations SNF (if pain doesn't improve quickly to allow improved mobility)    Equipment Recommendations  None recommended by PT    Recommendations for Other Services OT consult     Precautions / Restrictions Precautions Precautions: Fall      Mobility  Bed Mobility Overal bed mobility: Needs Assistance Bed Mobility: Supine to Sit     Supine to sit: Min assist     General bed mobility comments: Assist to support RLE and to elevate trunk  Transfers Overall transfer level: Needs assistance Equipment used: None Transfers: Sit to/from [facility] Sit to Stand: Min assist Stand pivot transfers: Min assist       General transfer comment: Assist for balance and safety. Pt with antalgic step on LLE.  Ambulation/Gait                Stairs            Wheelchair Mobility    Modified Rankin (Stroke Patients Only)       Balance Overall balance assessment: Needs assistance Sitting-balance support: No upper extremity supported Sitting balance-[name]: Normal     Standing balance support: No upper extremity supported Standing balance-[name]: Fair Standing balance comment: limited by painful LLE                             Pertinent  Vitals/Pain Pain Assessment: 0-10 Pain Score: 10-Worst pain ever Pain Location: LLE and rt hand Pain Intervention(s): Monitored during session;Repositioned    Home Living Family/patient expects to be discharged to:: Private residence Living Arrangements: Alone   Type of Home: House Home Access: Stairs to enter Entrance Stairs-Rails: Psychiatric nurse of Steps: 4 Home Layout: One level Home Equipment: Environmental consultant - 2 wheels;Cane - single point      Prior Function Level of Independence: Independent with assistive device(s) (Used cane or walker)               Hand Dominance        Extremity/Trunk Assessment   Upper Extremity Assessment: RUE deficits/detail RUE Deficits / Details: Limited by painful hand. Pt kept hand propped on trunk and didn't use         Lower Extremity Assessment: Generalized weakness;LLE deficits/detail   LLE Deficits / Details: limited by painful lower leg. Needed assist to straight leg raise from bed.     Communication   Communication: No difficulties  Cognition Arousal/Alertness: Awake/alert Behavior During Therapy: WFL for tasks assessed/performed Overall Cognitive Status: Within Functional Limits for tasks assessed                      General Comments      Exercises        Assessment/Plan    PT  Assessment Patient needs continued PT services  PT Diagnosis Difficulty walking;Acute pain;Generalized weakness   PT Problem List Decreased strength;Decreased activity tolerance;Decreased mobility;Pain;Obesity  PT Treatment Interventions DME instruction;Gait training;Stair training;Functional mobility training;Therapeutic activities;Therapeutic exercise;Balance training;Patient/family education   PT Goals (Current goals can be found in the Care Plan section) Acute Rehab PT Goals Patient Stated Goal: decr pain PT Goal Formulation: With patient Time For Goal Achievement: 09/08/14 Potential to Achieve Goals: Good     Frequency Min 3X/week   Barriers to discharge Decreased caregiver support      Co-evaluation               End of Session   Activity Tolerance: Patient limited by pain Patient left: in chair;with call bell/phone within reach;with chair alarm set Nurse Communication: Mobility status         Time: [id number] PT Time Calculation (min) (ACUTE ONLY): 16 min   Charges:   PT Evaluation $Initial PT Evaluation Tier I: 1 Procedure     PT G Codes:        Chardonnay Holzmann 09-05-14, 2:51 PM  [facility] PT [phone]

## 2014-09-01 NOTE — Progress Notes (Signed)
[facility] PROGRESS NOTE  [name] [id number] DOB: 1956/01/15 DOA: 08/31/2014 PCP: [name], MD  Assessment/Plan: Active Problems:   Cellulitis of hand, right - Etiology of erythematous hand unknown. Xray reporting that appearance is suspicious for arthropathic etiology. - ordered MRI for further work up - make patient npo - Pt currently on broad spectrum antibiotic  - Continue supportive therapy    HTN (hypertension) - amlodipine, metoprolol, imdur - well controlled currently    HLD (hyperlipidemia) - stable currently on lovaza - no chest pain reported.    Anemia - due to GI bleed & chronic disease. - stable, no brbpr reported    History of pulmonary embolism, recurrent, 3 seperate episodes. - on Coumadin may have to discontinue if Ortho procedure planed    Diastolic dysfunction, grade 2, EF 65-70% May 2013 - stable and compensated continue current medical management.    Chronic renal insufficiency, stage IV (severe) - due to DM & HTN, SCr improving-1.6 - stable    UTI (lower urinary tract infection) - currently on is recommended antibiotics  - Follow-up with urine culture    Osteoarthritis -Continue when necessary pain medication  Code Status: Full Family Communication: No family at bedside Disposition Plan: Pending improvement in condition   Consultants:  [place] hand specialist: Dr. Apolonio Schneiders  Procedures:  None  Antibiotics:  Ceftriaxone  Vancomycin  HPI/Subjective: Patient has no new complaints. Hand still hurting. Very tender to the touch  Objective: Filed Vitals:   09/01/14 0933  BP: 141/61  Pulse: 97  Temp:   Resp:     Intake/Output Summary (Last 24 hours) at 09/01/14 1400 Last data filed at 09/01/14 1319  Gross per 24 hour  Intake    360 ml  Output    950 ml  Net   -590 ml   Filed Weights   08/31/14 0901 08/31/14 1555  Weight: 129.729 kg (286 lb) 130.908 kg (288 lb 9.6 oz)     Exam:   General:  Patient in no acute distress, alert and awake  Cardiovascular: Regular rate and rhythm, no rubs  Respiratory: No increased work of breathing, equal chest rise, no audible wheezes  Abdomen: Soft, nondistended, no guarding  Musculoskeletal: Right hand erythema with pain on palpation flexing and/or extending fingers causes much discomfort.   Data Reviewed: Basic Metabolic Panel:  Recent Labs Lab 08/31/14 1114 09/01/14 0650  NA 139 135  K 4.0 4.4  CL 103 100*  CO2 26 28  GLUCOSE 207* 206*  BUN 34* 34*  CREATININE 1.38* 1.67*  CALCIUM 9.0 8.6*   Liver Function Tests: No results for input(s): AST, ALT, ALKPHOS, BILITOT, PROT, ALBUMIN in the last 168 hours. No results for input(s): LIPASE, AMYLASE in the last 168 hours. No results for input(s): AMMONIA in the last 168 hours. CBC:  Recent Labs Lab 08/31/14 1114 09/01/14 0650  WBC 7.2 6.8  HGB 11.0* 10.2*  HCT 35.0* 32.8*  MCV 91.1 92.4  PLT 133* 135*   Cardiac Enzymes: No results for input(s): CKTOTAL, CKMB, CKMBINDEX, TROPONINI in the last 168 hours. BNP (last 3 results) No results for input(s): BNP in the last 8760 hours.  ProBNP (last 3 results) No results for input(s): PROBNP in the last 8760 hours.  CBG:  Recent Labs Lab 08/31/14 1653 08/31/14 2013 08/31/14 2206 09/01/14 0826 09/01/14 1214  GLUCAP 204* 186* 179* 188* 169*    Recent Results (from the past 240 hour(s))  Culture, blood (routine x 2)     Status:  None (Preliminary result)   Collection Time: 08/31/14  2:40 PM  Result Value Ref Range Status   Specimen Description BLOOD LEFT HAND  Final   Special Requests BOTTLES DRAWN AEROBIC AND ANAEROBIC 5CC  Final   Culture   Final           BLOOD CULTURE RECEIVED NO GROWTH TO DATE CULTURE WILL BE HELD FOR 5 DAYS BEFORE ISSUING A FINAL NEGATIVE REPORT Performed at [facility]    Report Status PENDING  Incomplete     Studies: Dg Knee Complete 4 Views  Left  08/31/2014   CLINICAL DATA:  Left lateral knee pain and swelling, cellulitis  EXAM: LEFT KNEE - COMPLETE 4+ VIEW  COMPARISON:  01/08/2009  FINDINGS: Four views of the left knee submitted. No acute fracture or subluxation. There is narrowing of lateral joint compartment. Mild spurring of lateral femoral condyle and lateral tibial plateau. Small joint effusion. Mild narrowing of patellofemoral joint space.  IMPRESSION: No acute fracture or subluxation. Osteoarthritic changes as described above.   Electronically Signed   By: Lahoma Crocker M.D.   On: 08/31/2014 12:56   Dg Hand Complete Right  08/31/2014   CLINICAL DATA:  Right hand swelling with redness along fourth metacarpal. No recent trauma  EXAM: RIGHT HAND - COMPLETE 3+ VIEW  COMPARISON:  None.  FINDINGS: Frontal, oblique, and lateral views obtained. There is marked soft tissue swelling in all PIP joints as well as in the first MCP joint. There is no fracture or dislocation. There are persistent flexion deformities at all PIP joints. There is no appreciable joint space narrowing. No erosive change or periostitis. Calcification is noted in the triangular fibrocartilage region, well corticated.  IMPRESSION: Flexion deformities at all PIP joints with marked soft tissue swelling in all PIP joints. There is also swelling in the first MCP joint. No appreciable joint space narrowing or erosive change. The soft tissue swelling is felt to be of arthropathic etiology ; rheumatoid arthritis is of particular concern given this appearance. No fracture or dislocation. Calcification in the triangular fibrocartilage region is of uncertain etiology and potentially may represent previous trauma.   Electronically Signed   By: Lowella Grip III M.D.   On: 08/31/2014 12:55    Scheduled Meds: . amLODipine  5 mg Oral Daily  . aspirin EC  81 mg Oral Daily  . cefTRIAXone (ROCEPHIN)  IV  1 g Intravenous Once  . cefTRIAXone (ROCEPHIN)  IV  1 g Intravenous Q24H  .  cholecalciferol  2,000 Units Oral Daily  . citalopram  20 mg Oral QHS  . ferrous gluconate  324 mg Oral BID WC  . insulin aspart  0-15 Units Subcutaneous TID WC  . insulin glargine  28 Units Subcutaneous QHS  . irbesartan  300 mg Oral Daily  . isosorbide mononitrate  30 mg Oral Daily  . levothyroxine  50 mcg Oral QAC breakfast  . metoprolol  50 mg Oral BID  . multivitamin with minerals  1 tablet Oral Daily  . omega-3 acid ethyl esters  1 g Oral Daily  . vancomycin  1,500 mg Intravenous Q24H  . warfarin  7.5 mg Oral Q T,Th,S,Su-1800   And  . warfarin  5 mg Oral Q M,W,F-1800  . Warfarin - Pharmacist Dosing Inpatient   Does not apply q1800   Continuous Infusions:   Time spent: > 35 minutes   Velvet Bathe  [facility] Pager [phone] If 7PM-7AM, please contact night-coverage at [email or web address], password [email or web address] 09/01/2014,  2:00 PM  LOS: 1 day

## 2014-09-01 NOTE — Progress Notes (Signed)
Utilization Review completed. Tynslee Bowlds RN BSN CM 

## 2014-09-01 NOTE — Consult Note (Signed)
PT SEEN/EXAMINED CHART REVIEWED PT WITH RIGHT HAND PAIN AND SWELLING DENIES F/C NO OTHER CONSTITUTIONAL SYMPTOMS PT ADMITTED FOR IV ABX AND EVALUTATION HAS MRI OF HAND ORDERED TODAY  NORMAL WBC AFEBRILE AWAKE ALERT NONTOXIC RIGHT HAND: MILD SWELLING OVER DORSUM OF HAND AND WRIST PAIN WITH GENTLE WRIST AND DIGITAL MOBILITY FINGERS WARM WELL PERFUSED MODERATE SWELLING OVER PIP JOINTS NO ASCENDING ERYTHEMA OR LYMPHANGITIS NO OPEN WOUNDS OR SCARS  RADIOGRAPHS REVIEWED  RIGHT HAND INFLAMMATORY ARTHROPATHY, LIKELY GOUT,   NO SURGERY RECOMMENDED INFLAMMATORY SOURCE BEING EVALUATED MRI WAS ALREADY ORDERED OK TO EAT ICE/ELEVATE SPLINT FOR COMFORT PLEASE CONTACT IF ANY QUESTIONS  [phone]

## 2014-09-02 DIAGNOSIS — N189 Chronic kidney disease, unspecified: Secondary | ICD-10-CM

## 2014-09-02 LAB — CBC
HEMATOCRIT: 34.5 % — AB (ref 36.0–46.0)
Hemoglobin: 10.6 g/dL — ABNORMAL LOW (ref 12.0–15.0)
MCH: 28.2 pg (ref 26.0–34.0)
MCHC: 30.7 g/dL (ref 30.0–36.0)
MCV: 91.8 fL (ref 78.0–100.0)
Platelets: 132 10*3/uL — ABNORMAL LOW (ref 150–400)
RBC: 3.76 MIL/uL — ABNORMAL LOW (ref 3.87–5.11)
RDW: 15.2 % (ref 11.5–15.5)
WBC: 7.8 10*3/uL (ref 4.0–10.5)

## 2014-09-02 LAB — GLUCOSE, CAPILLARY
GLUCOSE-CAPILLARY: 236 mg/dL — AB (ref 65–99)
Glucose-Capillary: 191 mg/dL — ABNORMAL HIGH (ref 65–99)
Glucose-Capillary: 192 mg/dL — ABNORMAL HIGH (ref 65–99)
Glucose-Capillary: 218 mg/dL — ABNORMAL HIGH (ref 65–99)

## 2014-09-02 LAB — BASIC METABOLIC PANEL
Anion gap: 12 (ref 5–15)
BUN: 41 mg/dL — AB (ref 6–20)
CHLORIDE: 99 mmol/L — AB (ref 101–111)
CO2: 23 mmol/L (ref 22–32)
CREATININE: 1.97 mg/dL — AB (ref 0.44–1.00)
Calcium: 8.7 mg/dL — ABNORMAL LOW (ref 8.9–10.3)
GFR calc non Af Amer: 27 mL/min — ABNORMAL LOW (ref >60)
GFR, EST AFRICAN AMERICAN: 31 mL/min — AB (ref >60)
Glucose, Bld: 208 mg/dL — ABNORMAL HIGH (ref 65–99)
Potassium: 4.8 mmol/L (ref 3.5–5.1)
SODIUM: 134 mmol/L — AB (ref 135–145)

## 2014-09-02 LAB — URIC ACID: URIC ACID, SERUM: 9.3 mg/dL — AB (ref 2.3–6.6)

## 2014-09-02 LAB — PROTIME-INR
INR: 2.98 — AB (ref 0.00–1.49)
Prothrombin Time: 30.4 seconds — ABNORMAL HIGH (ref 11.6–15.2)

## 2014-09-02 MED ORDER — COLCHICINE 0.6 MG PO TABS
1.2000 mg | ORAL_TABLET | Freq: Once | ORAL | Status: AC
  Administered 2014-09-02: 1.2 mg via ORAL
  Filled 2014-09-02: qty 2

## 2014-09-02 MED ORDER — CEFTRIAXONE SODIUM IN DEXTROSE 20 MG/ML IV SOLN: 1.0000 g | INTRAVENOUS | Status: DC

## 2014-09-02 MED ORDER — CEFTRIAXONE SODIUM IN DEXTROSE 20 MG/ML IV SOLN
1.0000 g | INTRAVENOUS | Status: DC
  Administered 2014-09-02 – 2014-09-05 (×4): 1 g via INTRAVENOUS
  Filled 2014-09-02 (×4): qty 50

## 2014-09-02 NOTE — Progress Notes (Addendum)
Patient temp 102.0 orally. V/s in. Tylenol 650 mg prn given. Rogue Bussing, NP notified. NP stated blood cx pending.

## 2014-09-02 NOTE — Progress Notes (Signed)
[facility] PROGRESS NOTE  [name] [id number] DOB: 1956-03-26 DOA: 08/31/2014 PCP: [name], MD  Assessment/Plan: Active Problems:   Erythematous hand - Etiology of erythematous hand unknown. Xray reporting that appearance is suspicious for arthropathic etiology. - MRI obtained and reporting suspicion inflammatory or crystal arthropathy. - Or so consulted but currently sees no surgery - Patient does have history of gout. As such will try trial of colchicine. - We'll reassess next a.m. - Patient is afebrile and WBC within normal limits and x-rays not consistent with infectious etiology    HTN (hypertension) - amlodipine, metoprolol, imdur - well controlled currently    HLD (hyperlipidemia) - stable currently on lovaza - no chest pain reported.    Anemia - due to GI bleed & chronic disease. - stable, no brbpr reported    History of pulmonary embolism, recurrent, 3 seperate episodes. - on Coumadin may have to discontinue if Ortho procedure planed    Diastolic dysfunction, grade 2, EF 65-70% May 2013 - stable and compensated continue current medical management.    Chronic renal insufficiency, stage IV (severe) - due to DM & HTN, SCr improving-1.6 - stable    UTI (lower urinary tract infection) - currently on is recommended antibiotics  - Follow-up with urine culture    Osteoarthritis -Continue when necessary pain medication  Code Status: Full Family Communication: No family at bedside Disposition Plan: Pending improvement in condition   Consultants:  [place] hand specialist: Dr. Apolonio Schneiders  Procedures:  None  Antibiotics:  Ceftriaxone  Vancomycin  HPI/Subjective: Patient has no new complaints. Hand still hurting. Tender to touch  Objective: Filed Vitals:   09/02/14 0850  BP: 118/52  Pulse:   Temp:   Resp:     Intake/Output Summary (Last 24 hours) at 09/02/14 1433 Last data filed at 09/02/14 1352  Gross per 24 hour   Intake   1354 ml  Output   1050 ml  Net    304 ml   Filed Weights   08/31/14 0901 08/31/14 1555  Weight: 129.729 kg (286 lb) 130.908 kg (288 lb 9.6 oz)    Exam:   General:  Patient in no acute distress, alert and awake  Cardiovascular: Regular rate and rhythm, no rubs  Respiratory: No increased work of breathing, equal chest rise, no audible wheezes  Abdomen: Soft, nondistended, no guarding  Musculoskeletal: Right hand erythema with pain on palpation flexing and/or extending fingers causes much discomfort.   Data Reviewed: Basic Metabolic Panel:  Recent Labs Lab 08/31/14 1114 09/01/14 0650 09/02/14 0420  NA 139 135 134*  K 4.0 4.4 4.8  CL 103 100* 99*  CO2 26 28 23   GLUCOSE 207* 206* 208*  BUN 34* 34* 41*  CREATININE 1.38* 1.67* 1.97*  CALCIUM 9.0 8.6* 8.7*   Liver Function Tests: No results for input(s): AST, ALT, ALKPHOS, BILITOT, PROT, ALBUMIN in the last 168 hours. No results for input(s): LIPASE, AMYLASE in the last 168 hours. No results for input(s): AMMONIA in the last 168 hours. CBC:  Recent Labs Lab 08/31/14 1114 09/01/14 0650 09/02/14 0420  WBC 7.2 6.8 7.8  HGB 11.0* 10.2* 10.6*  HCT 35.0* 32.8* 34.5*  MCV 91.1 92.4 91.8  PLT 133* 135* 132*   Cardiac Enzymes: No results for input(s): CKTOTAL, CKMB, CKMBINDEX, TROPONINI in the last 168 hours. BNP (last 3 results) No results for input(s): BNP in the last 8760 hours.  ProBNP (last 3 results) No results for input(s): PROBNP in the last 8760 hours.  CBG:  Recent Labs Lab 09/01/14 1214 09/01/14 1707 09/01/14 2145 09/02/14 0748 09/02/14 1206  GLUCAP 169* 173* 159* 191* 236*    Recent Results (from the past 240 hour(s))  Culture, blood (routine x 2)     Status: None (Preliminary result)   Collection Time: 08/31/14  2:40 PM  Result Value Ref Range Status   Specimen Description BLOOD LEFT HAND  Final   Special Requests BOTTLES DRAWN AEROBIC AND ANAEROBIC 5CC  Final   Culture   Final            BLOOD CULTURE RECEIVED NO GROWTH TO DATE CULTURE WILL BE HELD FOR 5 DAYS BEFORE ISSUING A FINAL NEGATIVE REPORT Performed at [facility]    Report Status PENDING  Incomplete  Culture, blood (routine x 2)     Status: None (Preliminary result)   Collection Time: 08/31/14  5:05 PM  Result Value Ref Range Status   Specimen Description BLOOD LEFT HAND  Final   Special Requests BOTTLES DRAWN AEROBIC ONLY 5CC  Final   Culture   Final           BLOOD CULTURE RECEIVED NO GROWTH TO DATE CULTURE WILL BE HELD FOR 5 DAYS BEFORE ISSUING A FINAL NEGATIVE REPORT Performed at [facility]    Report Status PENDING  Incomplete     Studies: Mr Hand Right Wo Contrast  09/02/2014   CLINICAL DATA:  Swelling over the dorsal aspect of the right hand and wrist with decreased mobility of the fingers. Symptoms for approximately 48 hours.  EXAM: MRI OF THE RIGHT HAND WITHOUT CONTRAST  TECHNIQUE: Multiplanar, multisequence MR imaging was performed. No intravenous contrast was administered.  COMPARISON:  Plain films the right hand 08/31/2014.  FINDINGS: Subcutaneous edema about the hand is worst over the dorsum. Mild edema is also seen within intrinsic musculature of the hand. There is no focal fluid collection.  The flexor and extensor tendons are intact. A small amount of fluid is seen in the sheath of the extensor carpi ulnaris and there is mildly increased T2 signal within the substance of the tendon. The carpal tunnel and median nerve are unremarkable. A small effusion is seen in the distal radioulnar joint. Synovium about the dorsal aspect of the carpus appears thickened. The triangular fibrocartilage appears intact. The scapholunate and lunotriquetral ligaments are unremarkable.  There appears to be an erosion in the ulnar styloid. T2 hyperintense foci are seen in the heads of the third and fourth metacarpals, larger in the third metacarpal head. Bone marrow signal is otherwise unremarkable.   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 The appearance is not typical of infection.   Electronically Signed   By: Inge Rise M.D.   On: 09/02/2014 09:02    Scheduled Meds: . amLODipine  5 mg Oral Daily  . aspirin EC  81 mg Oral Daily  . cefTRIAXone (ROCEPHIN)  IV  1 g Intravenous Q24H  . cholecalciferol  2,000 Units Oral Daily  . citalopram  20 mg Oral QHS  . colchicine  1.2 mg Oral Once  . ferrous gluconate  324 mg Oral BID WC  . insulin aspart  0-15 Units Subcutaneous TID WC  . insulin glargine  28 Units Subcutaneous QHS  . irbesartan  300 mg Oral Daily  . isosorbide mononitrate  30 mg Oral Daily  . levothyroxine  50 mcg Oral QAC breakfast  . metoprolol  50 mg Oral BID  . multivitamin with minerals  1 tablet Oral Daily  . omega-3 acid ethyl esters  1 g Oral Daily  . warfarin  7.5 mg Oral Q T,Th,S,Su-1800   And  . warfarin  5 mg Oral Q M,W,F-1800  . Warfarin - Pharmacist Dosing Inpatient   Does not apply q1800   Continuous Infusions:   Time spent: > 35 minutes   Velvet Bathe  [facility] Pager [phone] If 7PM-7AM, please contact night-coverage at [email or web address], password [email or web address] 09/02/2014, 2:33 PM  LOS: 2 days

## 2014-09-02 NOTE — Progress Notes (Signed)
MRI REVIEWED REPORT IN CHART APPEARS LIKELY INFLAMMATORY ARTHROPATHY NO SURGERY RECOMMENDED IF QUESTIONS PLEASE CONTACT ME

## 2014-09-02 NOTE — Progress Notes (Addendum)
ANTICOAGULATION and ANTIBIOTIC CONSULT NOTE   Pharmacy Consult for vancomycin and coumadin Indication: hand infection and vte treatment  Allergies  Allergen Reactions  . Morphine And Related Rash    Patient Measurements: Height: 5\' 3"  (160 cm) Weight: 288 lb 9.6 oz (130.908 kg) IBW/kg (Calculated) : 52.4   Vital Signs: Temp: 98.6 F (37 C) (05/28 0500) Temp Source: Oral (05/28 0500) BP: 118/52 mmHg (05/28 0850) Pulse Rate: 103 (05/28 0500)  Labs:  Recent Labs  08/31/14 1114 08/31/14 1333 09/01/14 0650 09/02/14 0420  HGB 11.0*  --  10.2* 10.6*  HCT 35.0*  --  32.8* 34.5*  PLT 133*  --  135* 132*  LABPROT  --  26.7*  --  30.4*  INR  --  2.50*  --  2.98*  CREATININE 1.38*  --  1.67* 1.97*    Estimated Creatinine Clearance: 41.2 mL/min (by C-G formula based on Cr of 1.97).   Assessment: 59 yo F with PMH multiple DVTs/PEs s/p IVC filter, OA, obesity, DM, HTN, HLD in ED with multiple complaints including generalized weakness, worsening L knee pain, R hand swelling.  R hand x-ray showing deformity suspicious for rheumatoid arthritis.  Pharmacy dosing vancomycin for suspicion of R hand cellulitis. Rocephin given for UTI per urinalysis. Pharmacy consulted to continue home coumadin dosing for hx of DVTs/PEs and Afib.    SrCr 1.38>1.67>1.97. Tmax 99.1, MRI of hand appears likely inflammatory arthropathy per ortho note.    vanc (cellulitis R hand) 5/26>>5/28 Ceftriaxone (UTI) 5/26>>  5/26 BCx2>>ngtd  INR 2.98; PLTC low at 132.  Past PLTC [id number].  H/H 10.6/34.5.  No bleeding reported.  Goal of Therapy:  INR 2-3 Monitor platelets by anticoagulation protocol: Yes  Vancomycin trough 10-15 mcg/ml for cellulitis   Plan:  -vancomycin stopped 5/28 -continue home coumadin dose of 7.5 mg daily x 5 mg MWF. -daily INR -f/u renal fxn, wbc, temp, clinical course -drug levels as needed  Eudelia Bunch, Pharm.D. [id number] 09/02/2014 11:50 AM

## 2014-09-02 NOTE — Progress Notes (Signed)
PT Cancellation Note  Patient Details Name: [name] MRN: [id number] DOB: 09/21/1955   Cancelled Treatment:    Reason Eval/Treat Not Completed: Pain limiting ability to participate   [facility] 09/02/2014, 11:56 AM  [facility] PT [phone]

## 2014-09-03 DIAGNOSIS — M10041 Idiopathic gout, right hand: Secondary | ICD-10-CM

## 2014-09-03 LAB — GLUCOSE, CAPILLARY
GLUCOSE-CAPILLARY: 158 mg/dL — AB (ref 65–99)
GLUCOSE-CAPILLARY: 178 mg/dL — AB (ref 65–99)
Glucose-Capillary: 179 mg/dL — ABNORMAL HIGH (ref 65–99)
Glucose-Capillary: 150 mg/dL — ABNORMAL HIGH (ref 65–99)

## 2014-09-03 LAB — PROTIME-INR
INR: 2.82 — AB (ref 0.00–1.49)
PROTHROMBIN TIME: 29.2 s — AB (ref 11.6–15.2)

## 2014-09-03 MED ORDER — COLCHICINE 0.6 MG PO TABS
0.6000 mg | ORAL_TABLET | Freq: Every day | ORAL | Status: DC
  Administered 2014-09-03 – 2014-09-05 (×3): 0.6 mg via ORAL
  Filled 2014-09-03 (×3): qty 1

## 2014-09-03 MED ORDER — HYDROCODONE-ACETAMINOPHEN 5-325 MG PO TABS
1.0000 | ORAL_TABLET | Freq: Four times a day (QID) | ORAL | Status: DC | PRN
  Administered 2014-09-03 – 2014-09-05 (×5): 2 via ORAL
  Filled 2014-09-03 (×5): qty 2

## 2014-09-03 NOTE — Progress Notes (Signed)
Rogue Bussing NP paged regarding pt's BP of 98/49. NP called back and gave order to hold Lopressor.

## 2014-09-03 NOTE — Progress Notes (Signed)
[facility] PROGRESS NOTE  [name] [id number] DOB: 10/14/1955 DOA: 08/31/2014 PCP: [name], MD  Assessment/Plan: Active Problems:   Erythematous hand - Etiology of erythematous hand unknown. Xray reporting that appearance is suspicious for arthropathic etiology. - MRI obtained and reporting suspicion inflammatory or crystal arthropathy. - Or so consulted but currently sees no surgery - Improved with trial of colchicine. As such suspect most likely gout - Continue supportive therapy - Patient is afebrile and WBC within normal limits and x-rays not consistent with infectious etiology    HTN (hypertension) - amlodipine, metoprolol, imdur - well controlled currently    HLD (hyperlipidemia) - stable currently on lovaza - no chest pain reported.    Anemia - due to GI bleed & chronic disease. - stable, no brbpr reported    History of pulmonary embolism, recurrent, 3 seperate episodes. - on Coumadin may have to discontinue if Ortho procedure planed    Diastolic dysfunction, grade 2, EF 65-70% May 2013 - stable and compensated continue current medical management.    Chronic renal insufficiency, stage IV (severe) - due to DM & HTN, SCr improving-1.6 - stable    UTI (lower urinary tract infection) - currently on is recommended antibiotics  - Follow-up with urine culture    Osteoarthritis -Continue when necessary pain medication - will increase norco dose  Code Status: Full Family Communication: No family at bedside Disposition Plan: SNF on d/c when ready. In the next 1-2 days   Consultants:  [place] hand specialist: Dr. Apolonio Schneiders  Procedures:  None  Antibiotics:  Ceftriaxone  Vancomycin  HPI/Subjective: Patient has no new complaints. Still having discomfort from knees BL  Objective: Filed Vitals:   09/03/14 1030  BP: 112/59  Pulse:   Temp:   Resp:     Intake/Output Summary (Last 24 hours) at 09/03/14 1314 Last data filed  at 09/03/14 0930  Gross per 24 hour  Intake    750 ml  Output   1100 ml  Net   -350 ml   Filed Weights   08/31/14 0901 08/31/14 1555  Weight: 129.729 kg (286 lb) 130.908 kg (288 lb 9.6 oz)    Exam:   General:  Patient in no acute distress, alert and awake  Cardiovascular: Regular rate and rhythm, no rubs  Respiratory: No increased work of breathing, equal chest rise, no audible wheezes  Abdomen: Soft, nondistended, no guarding  Musculoskeletal: Right hand erythema with pain on palpation flexing and/or extending fingers causes much discomfort.   Data Reviewed: Basic Metabolic Panel:  Recent Labs Lab 08/31/14 1114 09/01/14 0650 09/02/14 0420  NA 139 135 134*  K 4.0 4.4 4.8  CL 103 100* 99*  CO2 26 28 23   GLUCOSE 207* 206* 208*  BUN 34* 34* 41*  CREATININE 1.38* 1.67* 1.97*  CALCIUM 9.0 8.6* 8.7*   Liver Function Tests: No results for input(s): AST, ALT, ALKPHOS, BILITOT, PROT, ALBUMIN in the last 168 hours. No results for input(s): LIPASE, AMYLASE in the last 168 hours. No results for input(s): AMMONIA in the last 168 hours. CBC:  Recent Labs Lab 08/31/14 1114 09/01/14 0650 09/02/14 0420  WBC 7.2 6.8 7.8  HGB 11.0* 10.2* 10.6*  HCT 35.0* 32.8* 34.5*  MCV 91.1 92.4 91.8  PLT 133* 135* 132*   Cardiac Enzymes: No results for input(s): CKTOTAL, CKMB, CKMBINDEX, TROPONINI in the last 168 hours. BNP (last 3 results) No results for input(s): BNP in the last 8760 hours.  ProBNP (last 3 results) No results for  input(s): PROBNP in the last 8760 hours.  CBG:  Recent Labs Lab 09/02/14 1206 09/02/14 1730 09/02/14 2158 09/03/14 0745 09/03/14 1147  GLUCAP 236* 192* 218* 179* 158*    Recent Results (from the past 240 hour(s))  Culture, blood (routine x 2)     Status: None (Preliminary result)   Collection Time: 08/31/14  2:40 PM  Result Value Ref Range Status   Specimen Description BLOOD LEFT HAND  Final   Special Requests BOTTLES DRAWN AEROBIC AND  ANAEROBIC 5CC  Final   Culture   Final           BLOOD CULTURE RECEIVED NO GROWTH TO DATE CULTURE WILL BE HELD FOR 5 DAYS BEFORE ISSUING A FINAL NEGATIVE REPORT Performed at [facility]    Report Status PENDING  Incomplete  Culture, blood (routine x 2)     Status: None (Preliminary result)   Collection Time: 08/31/14  5:05 PM  Result Value Ref Range Status   Specimen Description BLOOD LEFT HAND  Final   Special Requests BOTTLES DRAWN AEROBIC ONLY 5CC  Final   Culture   Final           BLOOD CULTURE RECEIVED NO GROWTH TO DATE CULTURE WILL BE HELD FOR 5 DAYS BEFORE ISSUING A FINAL NEGATIVE REPORT Performed at [facility]    Report Status PENDING  Incomplete     Studies: Mr Hand Right Wo Contrast  09/02/2014   CLINICAL DATA:  Swelling over the dorsal aspect of the right hand and wrist with decreased mobility of the fingers. Symptoms for approximately 48 hours.  EXAM: MRI OF THE RIGHT HAND WITHOUT CONTRAST  TECHNIQUE: Multiplanar, multisequence MR imaging was performed. No intravenous contrast was administered.  COMPARISON:  Plain films the right hand 08/31/2014.  FINDINGS: Subcutaneous edema about the hand is worst over the dorsum. Mild edema is also seen within intrinsic musculature of the hand. There is no focal fluid collection.  The flexor and extensor tendons are intact. A small amount of fluid is seen in the sheath of the extensor carpi ulnaris and there is mildly increased T2 signal within the substance of the tendon. The carpal tunnel and median nerve are unremarkable. A small effusion is seen in the distal radioulnar joint. Synovium about the dorsal aspect of the carpus appears thickened. The triangular fibrocartilage appears intact. The scapholunate and lunotriquetral ligaments are unremarkable.  There appears to be an erosion in the ulnar styloid. T2 hyperintense foci are seen in the heads of the third and fourth metacarpals, larger in the third metacarpal head. Bone  marrow signal is otherwise unremarkable.  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 The appearance is not typical of infection.   Electronically Signed   By: Inge Rise M.D.   On: 09/02/2014 09:02    Scheduled Meds: . amLODipine  5 mg Oral Daily  . aspirin EC  81 mg Oral Daily  . cefTRIAXone (ROCEPHIN)  IV  1 g Intravenous Q24H  . cholecalciferol  2,000 Units Oral Daily  . citalopram  20 mg Oral QHS  . colchicine  0.6 mg Oral Daily  . ferrous gluconate  324 mg Oral BID WC  . insulin aspart  0-15 Units Subcutaneous TID WC  . insulin glargine  28 Units Subcutaneous QHS  . irbesartan  300 mg Oral Daily  . isosorbide mononitrate  30 mg Oral Daily  . levothyroxine  50  mcg Oral QAC breakfast  . metoprolol  50 mg Oral BID  . multivitamin with minerals  1 tablet Oral Daily  . omega-3 acid ethyl esters  1 g Oral Daily  . warfarin  7.5 mg Oral Q T,Th,S,Su-1800   And  . warfarin  5 mg Oral Q M,W,F-1800  . Warfarin - Pharmacist Dosing Inpatient   Does not apply q1800   Continuous Infusions:   Time spent: > 35 minutes   Velvet Bathe  [facility] Pager [phone] If 7PM-7AM, please contact night-coverage at [email or web address], password [facility] 09/03/2014, 1:14 PM  LOS: 3 days

## 2014-09-03 NOTE — Progress Notes (Signed)
ANTICOAGULATION    Pharmacy Consult for coumadin Indication: vte treatment  Allergies  Allergen Reactions  . Morphine And Related Rash    Patient Measurements: Height: 5\' 3"  (160 cm) Weight: 288 lb 9.6 oz (130.908 kg) IBW/kg (Calculated) : 52.4   Vital Signs: Temp: 98.6 F (37 C) (05/29 1016) Temp Source: Oral (05/29 1016) BP: 112/59 mmHg (05/29 1030)  Labs:  Recent Labs  09/01/14 0650 09/02/14 0420 09/03/14 0541  HGB 10.2* 10.6*  --   HCT 32.8* 34.5*  --   PLT 135* 132*  --   LABPROT  --  30.4* 29.2*  INR  --  2.98* 2.82*  CREATININE 1.67* 1.97*  --     Estimated Creatinine Clearance: 41.2 mL/min (by C-G formula based on Cr of 1.97).   Assessment: 59 yo F with PMH multiple DVTs/PEs s/p IVC filter, OA, obesity, DM, HTN, HLD.  Presented to ED with multiple complaints including generalized weakness, worsening L knee pain, R hand swelling.   Pharmacy consulted to continue home coumadin dosing for hx of DVTs/PEs and Afib.   INR 2.82; PLTC low at 132.  Past PLTC [phone].  H/H 10.6/34.5.  No bleeding reported.  Goal of Therapy:  INR 2-3    Plan:  -continue home coumadin dose of 7.5 mg daily x 5 mg MWF. -DC daily INR, will recheck ~ Wednesday if still here Eudelia Bunch, Pharm.D. BP:[phone] 09/03/2014 2:40 PM

## 2014-09-03 NOTE — Progress Notes (Signed)
Pt has not voiding much during the shift. Rogue Bussing NP paged regarding pt's bladder scan of 907 mL. Rogue Bussing NP placed order for in and out cath. Will carry out order.

## 2014-09-03 NOTE — Progress Notes (Signed)
Physical Therapy Treatment Patient Details Name: ANQUANETTE BIONDO MRN: [id number] DOB: 1956-02-12 Today's Date: 09/03/2014    History of Present Illness Pt adm with rt hand pain, redness and swelling. Pt also c/o weakness and found to have UTI. Pt is also c/o lt lower leg pain.    PT Comments    Pt continues to be severely limited by bil foot and LE pain. Pt also with pain in rt hand and lt elbow but pain in feet and legs is limiting pt's mobility more.  Follow Up Recommendations  SNF     Equipment Recommendations  Other (comment) (To be assessed)    Recommendations for Other Services OT consult     Precautions / Restrictions Precautions Precautions: Fall    Mobility  Bed Mobility Overal bed mobility: Needs Assistance Bed Mobility: Supine to Sit     Supine to sit: Min assist     General bed mobility comments: Assist to support LLE and to elevate trunk  Transfers Overall transfer level: Needs assistance Equipment used: None Transfers: Sit to/from [facility] Sit to Stand: +2 physical assistance;Mod assist Stand pivot transfers: Min assist;+2 physical assistance       General transfer comment: Assist to bring hips up and support for pivotal steps. Pt used BUE adduction to clamp arms of therapist and nurse to her sides for support.  Ambulation/Gait                 Stairs            Wheelchair Mobility    Modified Rankin (Stroke Patients Only)       Balance     Sitting balance-[name]: Normal       Standing balance-[name]: Poor Standing balance comment: 2 person support due to pain.                    Cognition Arousal/Alertness: Awake/alert Behavior During Therapy: WFL for tasks assessed/performed Overall Cognitive Status: Within Functional Limits for tasks assessed                      Exercises      General Comments        Pertinent Vitals/Pain Pain Assessment: 0-10 Pain Score:  10-Worst pain ever Pain Location: BLE, rt hand and lt elbow Pain Descriptors / Indicators: Crying Pain Intervention(s): Limited activity within patient's tolerance;Premedicated before session;Repositioned    Home Living                      Prior Function            PT Goals (current goals can now be found in the care plan section) Acute Rehab PT Goals Patient Stated Goal: decr pain PT Goal Formulation: With patient Time For Goal Achievement: 09/08/14 Potential to Achieve Goals: Good Progress towards PT goals: Not progressing toward goals - comment (pain)    Frequency  Min 3X/week    PT Plan Current plan remains appropriate    Co-evaluation             End of Session Equipment Utilized During Treatment:  (Pt too large for gait belt.) Activity Tolerance: Patient limited by pain Patient left: in chair;with call bell/phone within reach;with chair alarm set     Time: [id number] PT Time Calculation (min) (ACUTE ONLY): 21 min  Charges:  $Gait Training: 8-22 mins  G Codes:      Nicholas Trompeter 09/03/2014, 9:59 AM  [facility] PT [phone]

## 2014-09-03 NOTE — Progress Notes (Signed)
In and out cath complete. 1000 mL of yellow, clear urine, no odor. Pt tolerated it well.

## 2014-09-04 DIAGNOSIS — I519 Heart disease, unspecified: Secondary | ICD-10-CM

## 2014-09-04 LAB — CBC
HCT: 33 % — ABNORMAL LOW (ref 36.0–46.0)
Hemoglobin: 10.7 g/dL — ABNORMAL LOW (ref 12.0–15.0)
MCH: 29.4 pg (ref 26.0–34.0)
MCHC: 32.4 g/dL (ref 30.0–36.0)
MCV: 90.7 fL (ref 78.0–100.0)
Platelets: 146 10*3/uL — ABNORMAL LOW (ref 150–400)
RBC: 3.64 MIL/uL — ABNORMAL LOW (ref 3.87–5.11)
RDW: 14.8 % (ref 11.5–15.5)
WBC: 7.6 10*3/uL (ref 4.0–10.5)

## 2014-09-04 LAB — BASIC METABOLIC PANEL
Anion gap: 8 (ref 5–15)
BUN: 44 mg/dL — ABNORMAL HIGH (ref 6–20)
CHLORIDE: 102 mmol/L (ref 101–111)
CO2: 26 mmol/L (ref 22–32)
Calcium: 8.5 mg/dL — ABNORMAL LOW (ref 8.9–10.3)
Creatinine, Ser: 1.75 mg/dL — ABNORMAL HIGH (ref 0.44–1.00)
GFR calc Af Amer: 36 mL/min — ABNORMAL LOW (ref >60)
GFR, EST NON AFRICAN AMERICAN: 31 mL/min — AB (ref >60)
Glucose, Bld: 159 mg/dL — ABNORMAL HIGH (ref 65–99)
Potassium: 4.4 mmol/L (ref 3.5–5.1)
SODIUM: 136 mmol/L (ref 135–145)

## 2014-09-04 LAB — GLUCOSE, CAPILLARY
GLUCOSE-CAPILLARY: 161 mg/dL — AB (ref 65–99)
GLUCOSE-CAPILLARY: 154 mg/dL — AB (ref 65–99)
Glucose-Capillary: 142 mg/dL — ABNORMAL HIGH (ref 65–99)
Glucose-Capillary: 147 mg/dL — ABNORMAL HIGH (ref 65–99)

## 2014-09-04 MED ORDER — COLCHICINE 0.6 MG PO TABS: 0.6000 mg | ORAL_TABLET | Freq: Every day | ORAL | Status: DC

## 2014-09-04 MED ORDER — HYDROCODONE-ACETAMINOPHEN 5-325 MG PO TABS: 1.0000 | ORAL_TABLET | Freq: Four times a day (QID) | ORAL | Status: DC | PRN

## 2014-09-04 MED ORDER — METOPROLOL TARTRATE 25 MG PO TABS
25.0000 mg | ORAL_TABLET | Freq: Two times a day (BID) | ORAL | Status: DC
Start: 2014-09-04 — End: 2015-09-18

## 2014-09-04 MED ORDER — INSULIN ASPART 100 UNIT/ML ~~LOC~~ SOLN: 2.0000 [IU] | Freq: Three times a day (TID) | SUBCUTANEOUS | Status: DC

## 2014-09-04 MED ORDER — FUROSEMIDE 40 MG PO TABS: 40.0000 mg | ORAL_TABLET | Freq: Every day | ORAL | Status: DC

## 2014-09-04 NOTE — Progress Notes (Signed)
Chaplain respond to consult  Listened empathically : This sounds like it has taken an emotional toll on.  [name] is dealing with abandonment issues with family,church and son. Would like to re-connect with son and his family (grand-daughter).   09/04/14 1500  Clinical Encounter Type  Visited With Patient;Health care provider  Visit Type Initial;Spiritual support  Referral From Nurse  Consult/Referral To Chaplain  Spiritual Encounters  Spiritual Needs Emotional      Will follow-up with [name] as needed.

## 2014-09-04 NOTE — Discharge Summary (Addendum)
Physician Discharge Summary  PHILLIPA MELLISH [id number] DOB: 1955/12/23 DOA: 08/31/2014  PCP: [name], MD  Admit date: 08/31/2014 Discharge date: 09/04/2014  Time spent: > 35 minutes  Recommendations for Outpatient Follow-up:  1. If patient were to develop any signs and symptoms of UTI please consider prolonging anabiotic regimen. Has been treated for 3 days while in house with IV Rocephin 2. Also consider further workup for rheumatological disease 3. Patient improved on culture seen as such will discharge on colchicine 4. She will require physical therapy at skilled nursing facility and further pain control for her osteoarthritis 5. I have decreased her metoprolol and discontinued her amlodipine due to soft blood pressures. Given her chronic renal failure also discontinued her ARB. Please consider when to restart her ARB. Her Lasix will be held for the next few days and will be discontinued at a lower dose. 6. Pt to d/c home with home health services.  Discharge Diagnoses:  Active Problems:   HTN (hypertension)   HLD (hyperlipidemia)   Anemia - due to GI bleed & chronic disease.   History of pulmonary embolism, recurrent, 3 seperate episodes.   Diastolic dysfunction, grade 2, EF 65-70% May 2013   Chronic renal insufficiency, stage IV (severe) - due to DM & HTN, SCr improving-1.6   UTI (lower urinary tract infection)   Osteoarthritis   Cellulitis of hand, right   Discharge Condition: Stable  Diet recommendation: Heart healthy  Filed Weights   08/31/14 0901 08/31/14 1555  Weight: 129.729 kg (286 lb) 130.908 kg (288 lb 9.6 oz)    History of present illness/Hospital Course:   Patient is a 59 year old with history osteoarthritis, diastolic dysfunction grade 2, chronic renal insufficiency, anemia who presented with acute gout flare.  Acute gout flare - Xray reporting that appearance is suspicious for arthropathic etiology. - MRI obtained and reporting suspicion  inflammatory or crystal arthropathy. - orthopedic surgeon consultedt but did not see surgery - Improved with trial of colchicine. Aswill discharge with colchicine  - Continue supportive therapy - Patient is afebrile and WBC within normal limits and x-rays not consistent with infectious etiology   HTN (hypertension) - due to softer blood pressures will discontinue amlodipine,  decrease metoprolol dose, imdur - Lasix will be held for a few days and then continued at lower dose this could be adjusted by primary care physician moving forward   HLD (hyperlipidemia) - stable currently on lovaza - no chest pain reported.   Anemia - due to GI bleed & chronic disease. - stable, no brbpr reported   History of pulmonary embolism, recurrent, 3 seperate episodes. - on Coumadin  continue home regimen   Diastolic dysfunction, grade 2, EF 65-70% May 2013 - stable and compensated continue current medical management.   Chronic renal insufficiency, stage IV (severe) - due to DM & HTN, SCr improving-1.6 - stable   UTI (lower urinary tract infection) -Treated with anabiotic for 3 days with IV Rocephin   Osteoarthritis -Continue when necessary pain medication -  patient should continue physical therapy at skilled nursing facility  Procedures  none   consults Orthopedic surgery   Physical Exam: Filed Vitals:   09/04/14 0855  BP: 139/72  Pulse:   Temp:   Resp:     General:  patient in no acute distress, alert and awake Cardiovascular: S1 and S2 within normal limits, no rubs Respiratory:  clear to auscultation bilaterally, no wheezes  Discharge Instructions   Discharge Instructions    Call MD for:  redness,  tenderness, or signs of infection (pain, swelling, redness, odor or green/yellow discharge around incision site)    Complete by:  As directed      Call MD for:  severe uncontrolled pain    Complete by:  As directed      Call MD for:  temperature >100.4    Complete by:   As directed      Diet - low sodium heart healthy    Complete by:  As directed      Discharge instructions    Complete by:  As directed   Please follow-up with your primary care physician within the next one or 2 weeks.     Increase activity slowly    Complete by:  As directed           Current Discharge Medication List    START taking these medications   Details  colchicine 0.6 MG tablet Take 1 tablet (0.6 mg total) by mouth daily. Qty: 30 tablet, Refills: 0      CONTINUE these medications which have CHANGED   Details  furosemide (LASIX) 40 MG tablet Take 1 tablet (40 mg total) by mouth daily. Qty: 30 tablet    HYDROcodone-acetaminophen (NORCO/VICODIN) 5-325 MG per tablet Take 1-2 tablets by mouth every 6 (six) hours as needed for severe pain. Qty: 30 tablet, Refills: 0    insulin aspart (NOVOLOG) 100 UNIT/ML injection Inject 2 Units into the skin 3 (three) times daily with meals. Qty: 10 mL, Refills: 11    metoprolol (LOPRESSOR) 25 MG tablet Take 1 tablet (25 mg total) by mouth 2 (two) times daily.      CONTINUE these medications which have NOT CHANGED   Details  aspirin EC 81 MG tablet Take 81 mg by mouth daily.    cholecalciferol (VITAMIN D) 1000 UNITS tablet Take 2,000 Units by mouth daily.    citalopram (CELEXA) 20 MG tablet Take 20 mg by mouth at bedtime.    diazepam (VALIUM) 5 MG tablet Take 1 tablet (5 mg total) by mouth at bedtime as needed for anxiety or sleep (muscle spasms). For muscle spasms. Qty: 10 tablet, Refills: 0    ferrous gluconate (FERGON) 324 MG tablet Take 1 tablet (324 mg total) by mouth 2 (two) times daily with a meal. Qty: 60 tablet, Refills: 3    insulin glargine (LANTUS) 100 unit/mL SOPN Inject 28 Units into the skin at bedtime.    isosorbide mononitrate (IMDUR) 30 MG 24 hr tablet Take 1 tablet (30 mg total) by mouth daily. Qty: 30 tablet, Refills: 3    levothyroxine (SYNTHROID, LEVOTHROID) 50 MCG tablet Take 50 mcg by mouth daily at  12 noon.    Multiple Vitamin (MULITIVITAMIN WITH MINERALS) TABS Take 1 tablet by mouth daily.    Omega-3 Fatty Acids (FISH OIL) 1200 MG CAPS Take 1 capsule by mouth 2 (two) times daily.    warfarin (COUMADIN) 2.5 MG tablet Take 7.5 mg by mouth daily except 5 mg on Mon/Wed/Fri or as directed. Qty: 120 tablet, Refills: 2   Associated Diagnoses: Long-term (current) use of anticoagulants      STOP taking these medications     amLODipine (NORVASC) 5 MG tablet      olmesartan (BENICAR) 40 MG tablet        Allergies  Allergen Reactions  . Morphine And Related Rash      The results of significant diagnostics from this hospitalization (including imaging, microbiology, ancillary and laboratory) are listed below for reference.  Significant Diagnostic Studies: Mr Hand Right Wo Contrast  09/02/2014   CLINICAL DATA:  Swelling over the dorsal aspect of the right hand and wrist with decreased mobility of the fingers. Symptoms for approximately 48 hours.  EXAM: MRI OF THE RIGHT HAND WITHOUT CONTRAST  TECHNIQUE: Multiplanar, multisequence MR imaging was performed. No intravenous contrast was administered.  COMPARISON:  Plain films the right hand 08/31/2014.  FINDINGS: Subcutaneous edema about the hand is worst over the dorsum. Mild edema is also seen within intrinsic musculature of the hand. There is no focal fluid collection.  The flexor and extensor tendons are intact. A small amount of fluid is seen in the sheath of the extensor carpi ulnaris and there is mildly increased T2 signal within the substance of the tendon. The carpal tunnel and median nerve are unremarkable. A small effusion is seen in the distal radioulnar joint. Synovium about the dorsal aspect of the carpus appears thickened. The triangular fibrocartilage appears intact. The scapholunate and lunotriquetral ligaments are unremarkable.  There appears to be an erosion in the ulnar styloid. T2 hyperintense foci are seen in the heads of the  third and fourth metacarpals, larger in the third metacarpal head. Bone marrow signal is otherwise unremarkable.  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 The appearance is not typical of infection.   Electronically Signed   By: Inge Rise M.D.   On: 09/02/2014 09:02   Dg Knee Complete 4 Views Left  08/31/2014   CLINICAL DATA:  Left lateral knee pain and swelling, cellulitis  EXAM: LEFT KNEE - COMPLETE 4+ VIEW  COMPARISON:  01/08/2009  FINDINGS: Four views of the left knee submitted. No acute fracture or subluxation. There is narrowing of lateral joint compartment. Mild spurring of lateral femoral condyle and lateral tibial plateau. Small joint effusion. Mild narrowing of patellofemoral joint space.  IMPRESSION: No acute fracture or subluxation. Osteoarthritic changes as described above.   Electronically Signed   By: Lahoma Crocker M.D.   On: 08/31/2014 12:56   Dg Hand Complete Right  08/31/2014   CLINICAL DATA:  Right hand swelling with redness along fourth metacarpal. No recent trauma  EXAM: RIGHT HAND - COMPLETE 3+ VIEW  COMPARISON:  None.  FINDINGS: Frontal, oblique, and lateral views obtained. There is marked soft tissue swelling in all PIP joints as well as in the first MCP joint. There is no fracture or dislocation. There are persistent flexion deformities at all PIP joints. There is no appreciable joint space narrowing. No erosive change or periostitis. Calcification is noted in the triangular fibrocartilage region, well corticated.  IMPRESSION: Flexion deformities at all PIP joints with marked soft tissue swelling in all PIP joints. There is also swelling in the first MCP joint. No appreciable joint space narrowing or erosive change. The soft tissue swelling is felt to be of arthropathic etiology ; rheumatoid arthritis is of particular concern given this appearance. No fracture or  dislocation. Calcification in the triangular fibrocartilage region is of uncertain etiology and potentially may represent previous trauma.   Electronically Signed   By: Lowella Grip III M.D.   On: 08/31/2014 12:55    Microbiology: Recent Results (from the past 240 hour(s))  Culture, blood (routine x 2)     Status: None (Preliminary result)   Collection Time: 08/31/14  2:40 PM  Result Value Ref Range Status   Specimen Description BLOOD LEFT HAND  Final   Special  Requests BOTTLES DRAWN AEROBIC AND ANAEROBIC 5CC  Final   Culture   Final           BLOOD CULTURE RECEIVED NO GROWTH TO DATE CULTURE WILL BE HELD FOR 5 DAYS BEFORE ISSUING A FINAL NEGATIVE REPORT Performed at [facility]    Report Status PENDING  Incomplete  Culture, blood (routine x 2)     Status: None (Preliminary result)   Collection Time: 08/31/14  5:05 PM  Result Value Ref Range Status   Specimen Description BLOOD LEFT HAND  Final   Special Requests BOTTLES DRAWN AEROBIC ONLY 5CC  Final   Culture   Final           BLOOD CULTURE RECEIVED NO GROWTH TO DATE CULTURE WILL BE HELD FOR 5 DAYS BEFORE ISSUING A FINAL NEGATIVE REPORT Performed at [facility]    Report Status PENDING  Incomplete     Labs: Basic Metabolic Panel:  Recent Labs Lab 08/31/14 1114 09/01/14 0650 09/02/14 0420 09/04/14 0640  NA 139 135 134* 136  K 4.0 4.4 4.8 4.4  CL 103 100* 99* 102  CO2 26 28 23 26   GLUCOSE 207* 206* 208* 159*  BUN 34* 34* 41* 44*  CREATININE 1.38* 1.67* 1.97* 1.75*  CALCIUM 9.0 8.6* 8.7* 8.5*   Liver Function Tests: No results for input(s): AST, ALT, ALKPHOS, BILITOT, PROT, ALBUMIN in the last 168 hours. No results for input(s): LIPASE, AMYLASE in the last 168 hours. No results for input(s): AMMONIA in the last 168 hours. CBC:  Recent Labs Lab 08/31/14 1114 09/01/14 0650 09/02/14 0420 09/04/14 0640  WBC 7.2 6.8 7.8 7.6  HGB 11.0* 10.2* 10.6* 10.7*  HCT 35.0* 32.8* 34.5* 33.0*  MCV 91.1  92.4 91.8 90.7  PLT 133* 135* 132* 146*   Cardiac Enzymes: No results for input(s): CKTOTAL, CKMB, CKMBINDEX, TROPONINI in the last 168 hours. BNP: BNP (last 3 results) No results for input(s): BNP in the last 8760 hours.  ProBNP (last 3 results) No results for input(s): PROBNP in the last 8760 hours.  CBG:  Recent Labs Lab 09/03/14 1147 09/03/14 1645 09/03/14 2142 09/04/14 0754 09/04/14 1204  GLUCAP 158* 178* 150* 142* 147*       Signed:  Velvet Bathe  [facility] 09/04/2014, 1:33 PM

## 2014-09-04 NOTE — Progress Notes (Signed)
Utilization Review completed. Timiya Howells Transport planner BSN CM 4 day and dc bundle sent

## 2014-09-05 ENCOUNTER — Ambulatory Visit: Payer: Medicare HMO

## 2014-09-05 ENCOUNTER — Ambulatory Visit: Payer: Medicare HMO | Admitting: Oncology

## 2014-09-05 ENCOUNTER — Other Ambulatory Visit: Payer: Medicare HMO

## 2014-09-05 LAB — PROTIME-INR
INR: 2.89 — AB (ref 0.00–1.49)
Prothrombin Time: 29.8 seconds — ABNORMAL HIGH (ref 11.6–15.2)

## 2014-09-05 LAB — GLUCOSE, CAPILLARY
Glucose-Capillary: 142 mg/dL — ABNORMAL HIGH (ref 65–99)
Glucose-Capillary: 122 mg/dL — ABNORMAL HIGH (ref 65–99)

## 2014-09-05 NOTE — Progress Notes (Signed)
Pt ready for discharge home. Pt. Is alert and oriented. Pt is hemodynamically stable. AVS reviewed with pt. Capable of re verbalizing medication regimen. IV removed. Discharge plan appropriate and in place.

## 2014-09-05 NOTE — Progress Notes (Signed)
Physical Therapy Treatment Patient Details Name: [name] MRN: [id number] DOB: 1955-11-23 Today's Date: September 30, 2014    History of Present Illness Pt adm with rt hand pain, redness and swelling. Pt also c/o weakness and found to have UTI. Pt is also c/o lt lower leg pain.    PT Comments    Pt with greatly improved mobility now that pain has decreased. Pt able to return home from PT standpoint.  Follow Up Recommendations  Home health PT;Supervision - Intermittent     Equipment Recommendations       Recommendations for Other Services       Precautions / Restrictions Precautions Precautions: Fall Restrictions Weight Bearing Restrictions: No    Mobility  Bed Mobility Overal bed mobility: Modified Independent Bed Mobility: Supine to Sit     Supine to sit: Modified independent (Device/Increase time)        Transfers Overall transfer level: Modified independent Equipment used: Rolling walker (2 wheeled) Transfers: Sit to/from Stand Sit to Stand: Modified independent (Device/Increase time)            Ambulation/Gait Ambulation/Gait assistance: Modified independent (Device/Increase time) Ambulation Distance (Feet): 35 Feet Assistive device: Rolling walker (2 wheeled) Gait Pattern/deviations: Decreased step length - right;Decreased step length - left;Step-through pattern Gait velocity: decr Gait velocity interpretation: Below normal speed for age/gender General Gait Details: Steady gait. Pt had just returned to bed from amb to bathroom.   Stairs            Wheelchair Mobility    Modified Rankin (Stroke Patients Only)       Balance     Sitting balance-[name]: Normal       Standing balance-[name]: Fair                      Cognition Arousal/Alertness: Awake/alert Behavior During Therapy: WFL for tasks assessed/performed Overall Cognitive Status: Within Functional Limits for tasks assessed                      Exercises      General Comments        Pertinent Vitals/Pain Pain Assessment: 0-10 Pain Score: 1     Home Living                      Prior Function            PT Goals (current goals can now be found in the care plan section) Acute Rehab PT Goals Patient Stated Goal: decr pain PT Goal Formulation: With patient Time For Goal Achievement: 09/08/14 Potential to Achieve Goals: Good Progress towards PT goals: Progressing toward goals    Frequency  Min 3X/week    PT Plan Discharge plan needs to be updated    Co-evaluation             End of Session Equipment Utilized During Treatment:  (Pt too large for gait belt.) Activity Tolerance: Patient tolerated treatment well Patient left: with call bell/phone within reach;in bed;with bed alarm set     Time: [id number] PT Time Calculation (min) (ACUTE ONLY): 8 min  Charges:  $Gait Training: 8-22 mins                    G Codes:      Lee Kuang 2014/09/30, 10:34 AM  Suanne Marker PT [phone]

## 2014-09-05 NOTE — Progress Notes (Signed)
ANTICOAGULATION CONSULT NOTE - Follow Up Consult  Pharmacy Consult for Coumadin Indication: atrial fibrillation and hx DVT/PE  Allergies  Allergen Reactions  . Morphine And Related Rash    Patient Measurements: Height: 5\' 3"  (160 cm) Weight: 288 lb 9.6 oz (130.908 kg) IBW/kg (Calculated) : 52.4 Heparin Dosing Weight:   Vital Signs: Temp: 97.6 F (36.4 C) (05/31 1326) Temp Source: Oral (05/31 1326) BP: 136/53 mmHg (05/31 1326) Pulse Rate: 60 (05/31 1326)  Labs:  Recent Labs  09/03/14 0541 09/04/14 0640 09/05/14 0825  HGB  --  10.7*  --   HCT  --  33.0*  --   PLT  --  146*  --   LABPROT 29.2*  --  29.8*  INR 2.82*  --  2.89*  CREATININE  --  1.75*  --     Estimated Creatinine Clearance: 46.4 mL/min (by C-G formula based on Cr of 1.75).   Medications:  Scheduled:  . amLODipine  5 mg Oral Daily  . aspirin EC  81 mg Oral Daily  . cefTRIAXone (ROCEPHIN)  IV  1 g Intravenous Q24H  . cholecalciferol  2,000 Units Oral Daily  . citalopram  20 mg Oral QHS  . colchicine  0.6 mg Oral Daily  . ferrous gluconate  324 mg Oral BID WC  . insulin aspart  0-15 Units Subcutaneous TID WC  . insulin glargine  28 Units Subcutaneous QHS  . irbesartan  300 mg Oral Daily  . isosorbide mononitrate  30 mg Oral Daily  . levothyroxine  50 mcg Oral QAC breakfast  . metoprolol  50 mg Oral BID  . multivitamin with minerals  1 tablet Oral Daily  . omega-3 acid ethyl esters  1 g Oral Daily  . warfarin  7.5 mg Oral Q T,Th,S,Su-1800   And  . warfarin  5 mg Oral Q M,W,F-1800  . Warfarin - Pharmacist Dosing Inpatient   Does not apply q1800    Assessment: 59yo female on Coumadin for AFib and Hx of DVT/PE.  She is on her home dose of Coumadin with therapeutic INR.  No bleeding noted.  Goal of Therapy:  INR 2-3 Monitor platelets by anticoagulation protocol: Yes   Plan:  Continue current dosing TTS INR Watch for s/s of bleeding  Gracy Bruins, PharmD [facility]

## 2014-09-05 NOTE — Progress Notes (Signed)
Miranda with [facility] notified of DC today and order for RN PT OT Aide.

## 2014-09-06 LAB — CULTURE, BLOOD (ROUTINE X 2): CULTURE: NO GROWTH

## 2014-09-07 LAB — CULTURE, BLOOD (ROUTINE X 2): Culture: NO GROWTH

## 2014-09-15 MED ORDER — WARFARIN SODIUM 2.5 MG PO TABS: ORAL_TABLET | ORAL | Status: DC

## 2014-09-18 ENCOUNTER — Telehealth: Payer: Self-pay | Admitting: Pharmacist

## 2014-09-18 NOTE — Telephone Encounter (Signed)
Called patient to set up INR lab check and coumadin clinic visit. Left message for patient to call back to schedule.  Pt recently discharged from the hospital at the end of May. Last INR was therapeutic at 2.89 on 5/31.   Thank you,  Montel Clock, PharmD, BCOP

## 2014-10-02 ENCOUNTER — Other Ambulatory Visit (HOSPITAL_BASED_OUTPATIENT_CLINIC_OR_DEPARTMENT_OTHER): Payer: Medicare HMO

## 2014-10-02 ENCOUNTER — Ambulatory Visit (HOSPITAL_BASED_OUTPATIENT_CLINIC_OR_DEPARTMENT_OTHER): Payer: Medicare HMO | Admitting: Pharmacist

## 2014-10-02 DIAGNOSIS — Z86718 Personal history of other venous thrombosis and embolism: Secondary | ICD-10-CM

## 2014-10-02 DIAGNOSIS — Z86711 Personal history of pulmonary embolism: Secondary | ICD-10-CM

## 2014-10-02 DIAGNOSIS — I82409 Acute embolism and thrombosis of unspecified deep veins of unspecified lower extremity: Secondary | ICD-10-CM

## 2014-10-02 LAB — PROTIME-INR
INR: 2.7 (ref 2.00–3.50)
Protime: 32.4 Seconds — ABNORMAL HIGH (ref 10.6–13.4)

## 2014-10-02 LAB — POCT INR: INR: 2.7

## 2014-10-02 NOTE — Progress Notes (Signed)
INR slightly above goal of 2-2.2.  No bleeding/bruising.  No medication changes.  [name] was admitted for acute gout flare at end of May.  She states that she is finished colchicine.  She has appt in a couple weeks with rheumatologist.  Appetite is decreased due to increased pain from knees/hip.  Will hold coumadin today to allow INR to drift down then resume at same dose of 5mg  MWF and 7.5mg  other days.  Will check PT/INR in 1 month.  [name] had missed appt with Dr. Marko Plume due to hospital admission at end of May.  She will call to reschedule.

## 2014-10-10 ENCOUNTER — Ambulatory Visit
Admission: RE | Admit: 2014-10-10 | Discharge: 2014-10-10 | Disposition: A | Discharge status: Home / Self Care | Payer: Medicare HMO | Source: Ambulatory Visit | Attending: Rheumatology | Admitting: Rheumatology

## 2014-10-10 ENCOUNTER — Other Ambulatory Visit: Payer: Self-pay | Admitting: Rheumatology

## 2014-10-10 DIAGNOSIS — M545 Low back pain: Secondary | ICD-10-CM

## 2014-10-10 DIAGNOSIS — M79662 Pain in left lower leg: Secondary | ICD-10-CM

## 2014-10-26 ENCOUNTER — Telehealth: Payer: Self-pay | Admitting: Oncology

## 2014-10-26 NOTE — Telephone Encounter (Signed)
Returned Advertising account executive. Confirmed appointment moved per paitent request

## 2014-10-30 ENCOUNTER — Ambulatory Visit: Payer: Medicare HMO

## 2014-10-30 ENCOUNTER — Other Ambulatory Visit: Payer: Medicare HMO

## 2014-10-31 ENCOUNTER — Encounter: Payer: Self-pay | Admitting: Cardiovascular Disease

## 2014-11-04 ENCOUNTER — Other Ambulatory Visit: Payer: Self-pay | Admitting: Oncology

## 2014-11-16 ENCOUNTER — Other Ambulatory Visit: Payer: Self-pay | Admitting: Pharmacist

## 2014-11-16 ENCOUNTER — Other Ambulatory Visit (HOSPITAL_BASED_OUTPATIENT_CLINIC_OR_DEPARTMENT_OTHER): Payer: Medicare HMO

## 2014-11-16 ENCOUNTER — Ambulatory Visit (HOSPITAL_BASED_OUTPATIENT_CLINIC_OR_DEPARTMENT_OTHER): Payer: Medicare HMO | Admitting: Oncology

## 2014-11-16 ENCOUNTER — Encounter: Payer: Self-pay | Admitting: Oncology

## 2014-11-16 ENCOUNTER — Ambulatory Visit: Payer: Medicare HMO | Admitting: Pharmacist

## 2014-11-16 ENCOUNTER — Telehealth: Payer: Self-pay | Admitting: Oncology

## 2014-11-16 VITALS — BP 149/75 | HR 61 | Temp 97.5°F | Resp 18 | Ht 63.0 in | Wt 278.3 lb

## 2014-11-16 DIAGNOSIS — D638 Anemia in other chronic diseases classified elsewhere: Secondary | ICD-10-CM | POA: Diagnosis not present

## 2014-11-16 DIAGNOSIS — Z86711 Personal history of pulmonary embolism: Secondary | ICD-10-CM | POA: Diagnosis not present

## 2014-11-16 DIAGNOSIS — D631 Anemia in chronic kidney disease: Secondary | ICD-10-CM | POA: Diagnosis not present

## 2014-11-16 DIAGNOSIS — N184 Chronic kidney disease, stage 4 (severe): Secondary | ICD-10-CM | POA: Diagnosis not present

## 2014-11-16 DIAGNOSIS — Z86718 Personal history of other venous thrombosis and embolism: Secondary | ICD-10-CM | POA: Diagnosis not present

## 2014-11-16 DIAGNOSIS — D696 Thrombocytopenia, unspecified: Secondary | ICD-10-CM | POA: Diagnosis not present

## 2014-11-16 DIAGNOSIS — E119 Type 2 diabetes mellitus without complications: Secondary | ICD-10-CM

## 2014-11-16 DIAGNOSIS — I272 Other secondary pulmonary hypertension: Secondary | ICD-10-CM

## 2014-11-16 DIAGNOSIS — Z7901 Long term (current) use of anticoagulants: Secondary | ICD-10-CM

## 2014-11-16 LAB — COMPREHENSIVE METABOLIC PANEL (CC13)
ALBUMIN: 3.5 g/dL (ref 3.5–5.0)
ALK PHOS: 66 U/L (ref 40–150)
ALT: 29 U/L (ref 0–55)
AST: 30 U/L (ref 5–34)
Anion Gap: 8 mEq/L (ref 3–11)
BILIRUBIN TOTAL: 0.62 mg/dL (ref 0.20–1.20)
BUN: 31.1 mg/dL — AB (ref 7.0–26.0)
CO2: 31 mEq/L — ABNORMAL HIGH (ref 22–29)
Calcium: 9.4 mg/dL (ref 8.4–10.4)
Chloride: 103 mEq/L (ref 98–109)
Creatinine: 1.3 mg/dL — ABNORMAL HIGH (ref 0.6–1.1)
EGFR: 45 mL/min/{1.73_m2} — ABNORMAL LOW (ref >90)
Glucose: 71 mg/dl (ref 70–140)
POTASSIUM: 3.7 meq/L (ref 3.5–5.1)
SODIUM: 143 meq/L (ref 136–145)
Total Protein: 6.5 g/dL (ref 6.4–8.3)

## 2014-11-16 LAB — CBC WITH DIFFERENTIAL/PLATELET
BASO%: 0.7 % (ref 0.0–2.0)
Basophils Absolute: 0 10*3/uL (ref 0.0–0.1)
EOS%: 3.8 % (ref 0.0–7.0)
Eosinophils Absolute: 0.2 10*3/uL (ref 0.0–0.5)
HEMATOCRIT: 42 % (ref 34.8–46.6)
HEMOGLOBIN: 13.5 g/dL (ref 11.6–15.9)
LYMPH#: 1 10*3/uL (ref 0.9–3.3)
LYMPH%: 23.8 % (ref 14.0–49.7)
MCH: 30.1 pg (ref 25.1–34.0)
MCHC: 32.1 g/dL (ref 31.5–36.0)
MCV: 93.8 fL (ref 79.5–101.0)
MONO#: 0.5 10*3/uL (ref 0.1–0.9)
MONO%: 10.8 % (ref 0.0–14.0)
NEUT#: 2.6 10*3/uL (ref 1.5–6.5)
NEUT%: 60.9 % (ref 38.4–76.8)
PLATELETS: 115 10*3/uL — AB (ref 145–400)
RBC: 4.48 10*6/uL (ref 3.70–5.45)
RDW: 15.6 % — AB (ref 11.2–14.5)
WBC: 4.3 10*3/uL (ref 3.9–10.3)

## 2014-11-16 LAB — POCT INR: INR: 1.9

## 2014-11-16 LAB — PROTIME-INR
INR: 1.9 — ABNORMAL LOW (ref 2.00–3.50)
Protime: 22.8 Seconds — ABNORMAL HIGH (ref 10.6–13.4)

## 2014-11-16 NOTE — Progress Notes (Signed)
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 She has been monitored thru [facility] coumadin clinic since 2013. She has had no bleeding complications in past year, and nothing of concern for recurrent clots during that time.  Primary care is by Dr Abbott Pao, who follows her closely.    Patient was hospitalized in May with gout. Last labs in EMR in 08-2014 had hemoglobin stable 10.7, plt 146, iron 77 with % sat 24. She is now followed by Dr Charlestine Night, but does not have rheumatologic disease as far as I can tell.  Patient reports " fluid retention" in past several days, weight up at home. She generally adjusts chronic lasix for this problem. She is somewhat more SOB with exertion, not at rest. She has no pain or erythema in LE. She denies change in diet.    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 She had been off coumadin from time of the retinal bleed until this was resumed 2013. She had mild thrombocytopenia during hospitalization in 2013, HIT negative and not significantly different than counts back to at least 2011. 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 Goal INR with this history is 2.0 - 2.2.  Patient has multiple other significant co morbidities, including morbid obesity, HTN/ LBBB/ sinus bradycardia, pulmonary HTN, diabetes on insulin, stage IV chronic renal insufficiency,and anemia related to CRF/chronic disease/ bleeding from rectal fissure in 2013 and internal hemorrhoids in 01-2013.    Review of systems as above, also: Some neck  discomfort for which she has been prescribed muscle relaxant. No other new or different pain. No recent fever or symptoms of infection. No noted bleeding.  Remainder of 10 point Review of Systems negative.  Objective:  Vital signs in last 24 hours:  BP 149/75 mmHg  Pulse 61  Temp(Src) 97.5 F (36.4 C) (Oral)  Resp 18  Ht 5\' 3"  (1.6 m)  Wt 278 lb 4.8 oz (126.236 kg)  BMI 49.31 kg/m2 Weight down 6 lbs. Alert, oriented and appropriate. Ambulatory with cane. Respirations not labored RA at rest. Talkative, very pleasant, looks comfortable   HEENT:PERRL, sclerae not icteric. Oral mucosa moist without lesions, posterior pharynx clear.  Lymphatics:no cervical,supraclavicular, axillary adenopathy Resp: clear to auscultation bilaterally and normal percussion bilaterally Cardio: regular rate and rhythm. No gallop. GI: abdomen obese, soft, nontender, no appreciable organomegaly. Normally active bowel sounds.  Musculoskeletal/ Extremities: 1+ pedal edema without cords, tenderness Neuro: no peripheral neuropathy. Otherwise nonfocal Skin without rash, ecchymosis, petechiae    Lab Results:  Results for orders placed or performed in visit on 11/16/14  POCT INR  Result Value Ref Range   INR 1.9   Reviewed labs back to 2013, no hypercoagulable panel found, likely due to already started on anticoagulation. Will get those labs with exception of protein C and S with next labs.  CBC also today has WBC 4.3, ANC 2.6, Hgb 13.5 and plt 115. Note tends to run slightly low with platelets, will follow CMET available after visit has creatinine 1.3 (improved), LFTs fine, glucose 71.   Studies/Results:  No results found.  Medications: I have reviewed the patient's current medications. No change in coumadin 5 mg MWF and 7.5 mg other days.  DISCUSSION: patient lives close to [facility] and would be willing to transfer care to coag clinic/ Dr Beryle Beams there if appropriate, given [facility] coumadin clinic not  available after Sept.   Assessment/Plan:  1.History of several episodes of PEs, IVC filter in, history of retinal bleed ?while on coumadin, symptomatic bilateral LE DVTs 2013 such that she is back on coumadin.Consider transfer care to [place] clinic at [facility]. Will get hypercoag tests as noted, as I do not find this information in this EMR.  2.Anemia much better, I believe off ferrous gluconate now 3.intermittent mild thrombocytopenia since at least 2011, HIT negative. Platelet count varies, a little lower today. Will check CBC with next INR. 4.morbid obesity, BMI >49 5.cardiac disease with HTN, LBBB and hx sinus bradycardia 6.diabetes  7.pulmonary HTN  8.recent gout   All questions answered. Patient knows to call if concerns between scheduled visits. CC this note to Drs Abbott Pao and Charlestine Night. Time spent 20 min including >50% counseling and coordination of care.   Stephen Turnbaugh P, MD   11/16/2014, 12:54 PM

## 2014-11-16 NOTE — Telephone Encounter (Signed)
Called and left a message with 9/8 lab appointment

## 2014-11-16 NOTE — Progress Notes (Signed)
**  Final coumadin clinic appt**  INR remains close to goal of 2-2.2.  No bleeding/bruising.  [name] has begun taking allopurinol and colchicine.  Also Flexeril as needed.  Allopurinol could theoretically interact with warfarin by decreasing the metabolism of warfarin.  This effect is not seen as INR stable.  Will continue coumadin 5mg  MWF and 7.5mg  other days.  Will set up lab appt in 6 weeks for next PT/INR check.  This result will need to be managed by Dr. Marko Plume.  [name] has appt with Dr Marko Plume today after coumadin clinic appt.  It was a pleasure seeing [name] in coumadin clinic.

## 2014-11-19 DIAGNOSIS — Z7901 Long term (current) use of anticoagulants: Secondary | ICD-10-CM | POA: Insufficient documentation

## 2014-11-22 ENCOUNTER — Telehealth: Payer: Self-pay

## 2014-11-22 NOTE — Telephone Encounter (Signed)
-----   Message from Gordy Levan, MD sent at 11/19/2014  1:43 PM EDT ----- Please let her know I have asked lab to recheck CBC and some other coag labs with her next INR in early Sept.  Tell her that I think it may be best to refer her to the [place] clinic at [facility] with Dr Beryle Beams since coumadin clinic at [facility] will not be available. Dr Darnell Level is expert with coag issues, and she told me that she lives very close to [facility]. If ok with her, I need to work on that referral. We will continue to monitor at [facility] until referral accomplished if so.  Please also confirm if she is or is not on iron now. Hgb back up good but I cannot tell from med list if she is on iron  Thank you

## 2014-11-22 NOTE — Telephone Encounter (Signed)
Left message in voice mail regarding [place] clinic referral as well as confirming use of oral iron.   Requested that Ms. Dipalma call back to let Dr. Marko Plume know if she can proceed with referral and confirm if she is taking oral iron.

## 2014-11-24 NOTE — Telephone Encounter (Signed)
[name] stated that she is fine with the referral to Dr. Beryle Beams in the [place] clinic. She is taking Ferrous gluconate 324 mg daily after a meal.  Suggested that she take it on an empty stomach with OJ or vitamin C tablet.

## 2014-12-13 ENCOUNTER — Telehealth: Payer: Self-pay | Admitting: Oncology

## 2014-12-13 NOTE — Telephone Encounter (Signed)
Returned Designer, television/film set. Patient moved lab from 09/08 to 09/12. Patient confirmed

## 2014-12-14 ENCOUNTER — Other Ambulatory Visit: Payer: Medicare HMO

## 2014-12-18 ENCOUNTER — Telehealth: Payer: Self-pay | Admitting: Nurse Practitioner

## 2014-12-18 ENCOUNTER — Ambulatory Visit (HOSPITAL_BASED_OUTPATIENT_CLINIC_OR_DEPARTMENT_OTHER): Payer: Medicare HMO

## 2014-12-18 DIAGNOSIS — Z86711 Personal history of pulmonary embolism: Secondary | ICD-10-CM

## 2014-12-18 DIAGNOSIS — D696 Thrombocytopenia, unspecified: Secondary | ICD-10-CM

## 2014-12-18 LAB — PROTIME-INR
INR: 2.4 (ref 2.00–3.50)
Protime: 28.8 Seconds — ABNORMAL HIGH (ref 10.6–13.4)

## 2014-12-18 LAB — CBC WITH DIFFERENTIAL/PLATELET
BASO%: 1.4 % (ref 0.0–2.0)
BASOS ABS: 0.1 10*3/uL (ref 0.0–0.1)
EOS%: 7.3 % — ABNORMAL HIGH (ref 0.0–7.0)
Eosinophils Absolute: 0.3 10*3/uL (ref 0.0–0.5)
HEMATOCRIT: 32.8 % — AB (ref 34.8–46.6)
HGB: 10.6 g/dL — ABNORMAL LOW (ref 11.6–15.9)
LYMPH#: 0.9 10*3/uL (ref 0.9–3.3)
LYMPH%: 19.8 % (ref 14.0–49.7)
MCH: 30.5 pg (ref 25.1–34.0)
MCHC: 32.3 g/dL (ref 31.5–36.0)
MCV: 94.4 fL (ref 79.5–101.0)
MONO#: 0.4 10*3/uL (ref 0.1–0.9)
MONO%: 8 % (ref 0.0–14.0)
NEUT#: 3 10*3/uL (ref 1.5–6.5)
NEUT%: 63.5 % (ref 38.4–76.8)
PLATELETS: 145 10*3/uL (ref 145–400)
RBC: 3.48 10*6/uL — ABNORMAL LOW (ref 3.70–5.45)
RDW: 18.3 % — ABNORMAL HIGH (ref 11.2–14.5)
WBC: 4.8 10*3/uL (ref 3.9–10.3)

## 2014-12-18 NOTE — Telephone Encounter (Signed)
Per MD, patient informed INR 2.4 is in good range today and to continue current coumadin regimen 5 mg MWF and 7.5 mg other days. Patient verbalizes this is her current regimen and will continue as prescribed. She is informed we will be in touch about results of other labs drawn today. She will continue under care of Dr. Marko Plume for coagulation needs until [facility] sets up [place] clinic. Patient voices understanding and denies other needs at this time. She knows to call clinic with any new or worsening needs.

## 2014-12-22 ENCOUNTER — Other Ambulatory Visit: Payer: Self-pay | Admitting: Oncology

## 2014-12-22 ENCOUNTER — Telehealth: Payer: Self-pay

## 2014-12-22 DIAGNOSIS — Z7901 Long term (current) use of anticoagulants: Secondary | ICD-10-CM

## 2014-12-22 LAB — LUPUS ANTICOAGULANT PANEL

## 2014-12-22 LAB — BETA-2 GLYCOPROTEIN ANTIBODIES
Beta-2 Glyco I IgG: 4 G Units
Beta-2-Glycoprotein I IgA: 12 A Units
Beta-2-Glycoprotein I IgM: 6 M Units

## 2014-12-22 LAB — CARDIOLIPIN ANTIBODIES, IGG, IGM, IGA
ANTICARDIOLIPIN IGM: 12 [MPL'U]/mL — AB (ref <11)
Anticardiolipin IgA: 9 APL U/mL (ref <22)
Anticardiolipin IgG: 22 GPL U/mL (ref <23)

## 2014-12-22 LAB — RFX DRVVT SCR W/RFLX CONF 1:1 MIX: DRVVT SCREEN: 46 s — AB (ref <=45)

## 2014-12-22 LAB — RFX PTT-LA W/RFX TO HEX PHASE CONF
PTT-LA Screen: 39 s
PTT-LA Screen: 39 s

## 2014-12-22 LAB — ANTITHROMBIN III: ANTITHROMB III FUNC: 95 %{activity} (ref 80–120)

## 2014-12-22 LAB — FACTOR 5 LEIDEN

## 2014-12-22 NOTE — Telephone Encounter (Signed)
Told [name] the results of the coag lab results as noted below by Dr. Marko Plume.  Ms. Obradovich will keep appointment on 01-04-15 for PT/INR at [facility] as scheduled.

## 2014-12-22 NOTE — Telephone Encounter (Signed)
-----   Message from Gordy Levan, MD sent at 12/22/2014  7:49 AM EDT ----- Please let patient know that lab results do not show any different reason for her blood clot problems than we had known.  I have requested appointment for her with Dr Beryle Beams at the coagulation clinic at [facility] - he will take very good care of her, and this location is more convenient for her. Please tell her that I wish her the best. RN please follow up appointment date at [place] [facility], as we should continue regular coumadin follow up at [facility] until she is established there. thanks

## 2015-01-04 ENCOUNTER — Other Ambulatory Visit (HOSPITAL_BASED_OUTPATIENT_CLINIC_OR_DEPARTMENT_OTHER): Payer: Medicare HMO

## 2015-01-04 ENCOUNTER — Other Ambulatory Visit: Payer: Self-pay | Admitting: Oncology

## 2015-01-04 DIAGNOSIS — Z86718 Personal history of other venous thrombosis and embolism: Secondary | ICD-10-CM

## 2015-01-04 DIAGNOSIS — Z86711 Personal history of pulmonary embolism: Secondary | ICD-10-CM

## 2015-01-04 LAB — PROTIME-INR
INR: 2.2 (ref 2.00–3.50)
PROTIME: 26.4 s — AB (ref 10.6–13.4)

## 2015-01-06 ENCOUNTER — Other Ambulatory Visit: Payer: Self-pay | Admitting: Oncology

## 2015-01-08 ENCOUNTER — Telehealth: Payer: Self-pay | Admitting: Oncology

## 2015-01-08 NOTE — Telephone Encounter (Signed)
Called and left a message with a new lab and added md visit per dr Edwyna Shell   [facility] [place]

## 2015-01-12 ENCOUNTER — Telehealth: Payer: Self-pay

## 2015-01-12 NOTE — Telephone Encounter (Signed)
[name], [place] - 01/04/15 >','<< Less Detail',event)" href="javascript:;">More Detail >>   Gordy Levan, MD   Sent: Sat January 06, 2015 8:26 AM    To: Baruch Merl, RN    Cc: Patton Salles, RN        Result Note     Labs seen and need follow upL please let her know INR is in good range at 2.2 Continue same coumadin and document please. Dr Beryle Beams is not taking new patients now, so I will ask schedulers to set up apt with me coordinating with labs in 2-3 mo     Attached to     [place] (Order# [id number])      Protime-INR  Status: Finalresult Visible to patient:  MyChart Nextappt: 02/01/2015 at 11:00 AM in Oncology ([facility] ONLY) Dx:  History of pulmonary embolism, recurr...          Notes Recorded by Gordy Levan, MD on 01/06/2015 at 8:26 AM Labs seen and need follow upL please let her know INR is in good range at 2.2 Continue same coumadin and document please. Dr Beryle Beams is not taking new patients now, so I will ask schedulers to set up apt with me coordinating with labs in 2-3 mo       Ref Range 8d ago (01/04/15) 3wk ago (12/18/14) 58mo ago (11/16/14) 41mo ago (11/16/14) 23mo ago (10/02/14) 25mo ago (10/02/14) 40mo ago (09/05/14)   Protime 10.6 - 13.4 Seconds 26.4 (H) 28.8 (H) 22.8 (H)  32.4 (H)     INR 2.00 - 3.50  2.20 2.40CM 1.90 (L)CM 1.9R 2.70CM 2.7R 2.89 (H)R  Comments: INR is useful only to assess adequacy of anticoagulation with coumadin when comparing results from different labs. It should not be used to estimate bleeding risk or presence/abscense of coagulopathy in patients not on coumadin. Expected INR ranges for  nontherapeutic patients is 0.88 - 1.12.     Lovenox  No No No  No    Resulting Agency  [facility]  [facility]  Narrative    Performed At: [facility]        [address]        [place], [place] [address]      Specimen Collected:  01/04/15 10:27 AM Last Resulted: 01/04/15 10:35 AM Lab Flowsheet Order Details View Encounter Lab and Collection Details Routing Result History    CM=Additional commentsR=Reference range differs from displayed range

## 2015-01-12 NOTE — Telephone Encounter (Deleted)
-----   Message from Patton Salles, RN sent at 01/12/2015  1:59 PM EDT ----- Barbaraann Share, Have you done this?  ----- Message -----    From: Gordy Levan, MD    Sent: 01/06/2015   8:26 AM      To: Baruch Merl, RN, [place] seen and need follow upL please let her know INR is in good range at 2.2  Continue same coumadin and document please. Dr Beryle Beams is not taking new patients now, so I will ask schedulers to set up apt with me coordinating with labs in 2-3 mo

## 2015-01-12 NOTE — Telephone Encounter (Signed)
LM in voice mail that she should continue her Coumadin 5 mg MWF and 7.5 the other days.  Stated information noted below regarding referral to Dr. Beryle Beams and lab/follow up appointments as noted in her schedule as of 01-12-15. She can call the [place] [facility] if any questions or concerns.

## 2015-01-18 ENCOUNTER — Other Ambulatory Visit: Payer: Self-pay | Admitting: Internal Medicine

## 2015-01-18 ENCOUNTER — Ambulatory Visit
Admission: RE | Admit: 2015-01-18 | Discharge: 2015-01-18 | Disposition: A | Discharge status: Home / Self Care | Payer: Medicare HMO | Source: Ambulatory Visit | Attending: Internal Medicine | Admitting: Internal Medicine

## 2015-01-18 DIAGNOSIS — R0602 Shortness of breath: Secondary | ICD-10-CM

## 2015-01-22 ENCOUNTER — Other Ambulatory Visit: Payer: Self-pay | Admitting: Internal Medicine

## 2015-01-22 DIAGNOSIS — Z86718 Personal history of other venous thrombosis and embolism: Secondary | ICD-10-CM

## 2015-01-26 ENCOUNTER — Ambulatory Visit
Admission: RE | Admit: 2015-01-26 | Discharge: 2015-01-26 | Disposition: A | Discharge status: Home / Self Care | Payer: Medicare HMO | Source: Ambulatory Visit | Attending: Internal Medicine | Admitting: Internal Medicine

## 2015-01-26 DIAGNOSIS — Z86718 Personal history of other venous thrombosis and embolism: Secondary | ICD-10-CM

## 2015-02-01 ENCOUNTER — Other Ambulatory Visit: Payer: Medicare HMO

## 2015-02-01 ENCOUNTER — Telehealth: Payer: Self-pay | Admitting: Oncology

## 2015-02-01 NOTE — Telephone Encounter (Signed)
Patient called in to reschedule her lab to next week due to a stomach problem     anne

## 2015-02-02 ENCOUNTER — Telehealth: Payer: Self-pay | Admitting: Oncology

## 2015-02-02 NOTE — Telephone Encounter (Signed)
LVM re 07/19/15 appt. Advised lab @ 12.30 md @ 1pm. Also mailed calendar.

## 2015-02-06 ENCOUNTER — Other Ambulatory Visit (HOSPITAL_BASED_OUTPATIENT_CLINIC_OR_DEPARTMENT_OTHER): Payer: Medicare HMO

## 2015-02-06 ENCOUNTER — Telehealth: Payer: Self-pay

## 2015-02-06 DIAGNOSIS — Z86711 Personal history of pulmonary embolism: Secondary | ICD-10-CM | POA: Diagnosis not present

## 2015-02-06 DIAGNOSIS — Z86718 Personal history of other venous thrombosis and embolism: Secondary | ICD-10-CM | POA: Diagnosis not present

## 2015-02-06 LAB — PROTIME-INR
INR: 2.4 (ref 2.00–3.50)
Protime: 28.8 Seconds — ABNORMAL HIGH (ref 10.6–13.4)

## 2015-02-06 NOTE — Telephone Encounter (Signed)
PT/INR today was 2.4. Told [name] that Dr. Marko Plume said to stay on 5 mg of coumadin M-W-F and 7.5 mg the other days.  Keep appointment on 02-22-15 as scheduled. Ms. Humm verbalized understanding.

## 2015-02-21 ENCOUNTER — Other Ambulatory Visit: Payer: Self-pay | Admitting: Oncology

## 2015-02-21 DIAGNOSIS — Z86711 Personal history of pulmonary embolism: Secondary | ICD-10-CM

## 2015-02-21 DIAGNOSIS — Z7901 Long term (current) use of anticoagulants: Secondary | ICD-10-CM

## 2015-02-22 ENCOUNTER — Other Ambulatory Visit (HOSPITAL_BASED_OUTPATIENT_CLINIC_OR_DEPARTMENT_OTHER): Payer: Medicare HMO

## 2015-02-22 ENCOUNTER — Other Ambulatory Visit: Payer: Self-pay | Admitting: Oncology

## 2015-02-22 ENCOUNTER — Ambulatory Visit (HOSPITAL_BASED_OUTPATIENT_CLINIC_OR_DEPARTMENT_OTHER): Payer: Medicare HMO | Admitting: Oncology

## 2015-02-22 ENCOUNTER — Encounter: Payer: Self-pay | Admitting: Oncology

## 2015-02-22 VITALS — BP 152/73 | HR 57 | Temp 98.0°F | Resp 18 | Ht 63.0 in | Wt 294.8 lb

## 2015-02-22 DIAGNOSIS — N183 Chronic kidney disease, stage 3 unspecified: Secondary | ICD-10-CM

## 2015-02-22 DIAGNOSIS — Z86711 Personal history of pulmonary embolism: Secondary | ICD-10-CM

## 2015-02-22 DIAGNOSIS — I82403 Acute embolism and thrombosis of unspecified deep veins of lower extremity, bilateral: Secondary | ICD-10-CM | POA: Diagnosis not present

## 2015-02-22 DIAGNOSIS — D696 Thrombocytopenia, unspecified: Secondary | ICD-10-CM

## 2015-02-22 DIAGNOSIS — Z95828 Presence of other vascular implants and grafts: Secondary | ICD-10-CM

## 2015-02-22 DIAGNOSIS — D649 Anemia, unspecified: Secondary | ICD-10-CM | POA: Diagnosis not present

## 2015-02-22 DIAGNOSIS — Z86718 Personal history of other venous thrombosis and embolism: Secondary | ICD-10-CM | POA: Diagnosis not present

## 2015-02-22 DIAGNOSIS — Z7901 Long term (current) use of anticoagulants: Secondary | ICD-10-CM | POA: Diagnosis not present

## 2015-02-22 DIAGNOSIS — I82409 Acute embolism and thrombosis of unspecified deep veins of unspecified lower extremity: Secondary | ICD-10-CM | POA: Diagnosis not present

## 2015-02-22 LAB — CBC WITH DIFFERENTIAL/PLATELET
BASO%: 0.6 % (ref 0.0–2.0)
BASOS ABS: 0 10*3/uL (ref 0.0–0.1)
EOS%: 5.6 % (ref 0.0–7.0)
Eosinophils Absolute: 0.4 10*3/uL (ref 0.0–0.5)
HEMATOCRIT: 32.9 % — AB (ref 34.8–46.6)
HEMOGLOBIN: 10.3 g/dL — AB (ref 11.6–15.9)
LYMPH#: 1 10*3/uL (ref 0.9–3.3)
LYMPH%: 15.1 % (ref 14.0–49.7)
MCH: 31.2 pg (ref 25.1–34.0)
MCHC: 31.3 g/dL — AB (ref 31.5–36.0)
MCV: 99.7 fL (ref 79.5–101.0)
MONO#: 0.4 10*3/uL (ref 0.1–0.9)
MONO%: 5.8 % (ref 0.0–14.0)
NEUT#: 4.7 10*3/uL (ref 1.5–6.5)
NEUT%: 72.9 % (ref 38.4–76.8)
PLATELETS: 130 10*3/uL — AB (ref 145–400)
RBC: 3.3 10*6/uL — ABNORMAL LOW (ref 3.70–5.45)
RDW: 16.8 % — AB (ref 11.2–14.5)
WBC: 6.4 10*3/uL (ref 3.9–10.3)
nRBC: 0 % (ref 0–0)

## 2015-02-22 LAB — PROTIME-INR
INR: 1.9 — ABNORMAL LOW (ref 2.00–3.50)
Protime: 22.8 Seconds — ABNORMAL HIGH (ref 10.6–13.4)

## 2015-02-22 NOTE — Progress Notes (Signed)
OFFICE PROGRESS NOTE   February 22, 2015   Physicians:R.Moriera,( M.Croitoru) P.Benson [place], G.Truslow, J.Ganji  Dr Marisa Sprinkles office note 01-18-15 reviewed.  INTERVAL HISTORY:  Patient is seen, alone for visit, in continuing attention to long term anticoagulation due to history of pulmonary emboli on at least 3 occasions dating back to 2004, and bilateral LE DVTs in 2013.Marland Kitchen She has IVC filter in, but has been maintained on coumadin to decrease chance of clotting below the filter. She has had no acute bleeding complications with this coumadin, however hemoglobin is lower than she has had in past. Other evaluation for hypercoagulable condition has included labs 12-2014: Factor V Leiden, lupus anticoagulant, beta 2 glycoprotein and ATIII all WNL; anticardiolipin IgM 12 (normal <11, low + 12-20), not clearly significant.  Coag clinic at [facility] with Dr Beryle Beams not taking new patients presently LE venous dopplers 01-26-15 were negative for DVT.  Patient is followed closely by PCP Dr Abbott Pao. Cardiology care now by Dr Einar Gip, follow up appointment there within the week.    Patient has had no overt bleeding and no clear symptoms suggesting blood clots. She did stool for occult blood for Dr Abbott Pao ~ last month, which she understands was negative. She has been eating spinach fairly regularly in past several weeks, since changes in diet as recommended by Dr Einar Gip, this likely cause of lower INR today. She continues oral iron, which she takes away from meals as instructed, and tolerates without difficulty. She feels that she is retaining fluid since she has been off lasix (previously 40 mg /d). She is unable to sleep supine due to "gurgling" respirations. She is fatigued with minimal exertion, not able to walk her dogs. She may have allergies to pet dander. Bowels move regularly. She has had no recent gout flares. She is on low dose ASA. Last hospital admission was in June. She has had no recent infectious illness.  She has no productive cough, no chest pain. No change in vision. Chronic arthritis symptoms. Remainder of 10 point Review of Systems negative/ unchanged.  Hemoglobin was 13.5 on 11-16-14, then 10.6 on 12-18-14 and 10.3 today. MCV today 99.3.  Platelets are variably a little low, 115 on 11-2014, 145 in 12-2014 and 130 today; the mild thrombocytopenia dates back at least to 2011, HIT negative in past and no mention of splenomegaly on CT 2014.    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 She had been off coumadin from time of the retinal bleed until this was resumed 2013. She had mild thrombocytopenia during hospitalization in 2013, HIT negative and not significantly different than counts back to at least 2011. 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 Goal INR with this history is 2.0 - 2.2.  Patient has multiple other significant co morbidities, including morbid obesity, HTN/ LBBB/ sinus bradycardia, pulmonary HTN, diabetes on insulin, stage IV chronic renal insufficiency,and anemia related to CRF/chronic disease/ bleeding from rectal fissure in 2013 and internal hemorrhoids in 01-2013.   Objective:  Vital signs in last 24 hours:  BP 152/73 mmHg  Pulse 57  Temp(Src) 98 F (36.7 C) (Oral)  Resp 18  Ht 5' 3"  (1.6 m)  Wt 294 lb 12.8 oz (133.72 kg)  BMI 52.23 kg/m2  SpO2 97% Weight is up 6 lbs  from 08-2014 Alert, oriented and appropriate. Ambulatory without assistance difficulty.  Alopecia  HEENT:PERRL, sclerae not icteric. Oral mucosa moist without lesions, posterior pharynx clear.  Neck supple. No JVD.  Lymphatics:no supraclavicular adenopathy Resp: clear to auscultation bilaterally and normal percussion bilaterally, decreased BS lower fields consistent with  habitus Cardio: regular rate and rhythm. No gallop. GI: abdomen obese, soft, nontender, not obviously distended, cannot appreciate mass or organomegaly. Normally active bowel sounds. Musculoskeletal/ Extremities:LE tight swelling with < 1 cm bullae mostly left lower leg, no weeping, LE without cords or specific tenderness Neuro: no peripheral neuropathy. Otherwise nonfocal. PSYCH : crying re weight gain, more calm and otherwise appropriate by completion of visit. Skin without rash, ecchymosis, petechiae   Lab Results:  Results for orders placed or performed in visit on 02/22/15  Protime-INR  Result Value Ref Range   Protime 22.8 (H) 10.6 - 13.4 Seconds   INR 1.90 (L) 2.00 - 3.50   Lovenox No   CBC with Differential  Result Value Ref Range   WBC 6.4 3.9 - 10.3 10e3/uL   NEUT# 4.7 1.5 - 6.5 10e3/uL   HGB 10.3 (L) 11.6 - 15.9 g/dL   HCT 32.9 (L) 34.8 - 46.6 %   Platelets 130 (L) 145 - 400 10e3/uL   MCV 99.7 79.5 - 101.0 fL   MCH 31.2 25.1 - 34.0 pg   MCHC 31.3 (L) 31.5 - 36.0 g/dL   RBC 3.30 (L) 3.70 - 5.45 10e6/uL   RDW 16.8 (H) 11.2 - 14.5 %   lymph# 1.0 0.9 - 3.3 10e3/uL   MONO# 0.4 0.1 - 0.9 10e3/uL   Eosinophils Absolute 0.4 0.0 - 0.5 10e3/uL   Basophils Absolute 0.0 0.0 - 0.1 10e3/uL   NEUT% 72.9 38.4 - 76.8 %   LYMPH% 15.1 14.0 - 49.7 %   MONO% 5.8 0.0 - 14.0 %   EOS% 5.6 0.0 - 7.0 %   BASO% 0.6 0.0 - 2.0 %   nRBC 0 0 - 0 %   Hemoccult cards x 3 given, to return to this office or PCP  Will check iron studies, B12, erythropoietin with next labs at this office Last CMET in this EMR in 11-2014 had creatinine 1.3 and BUN 31 (improved from in 08-2014) with EGFR 45.  Studies/Results: LE venous dopplers 01-26-15 reviewed, no DVT  Medications: I have reviewed the patient's current medications, presently coumadin 7.5 mg daily except 5 mg M-W-F. See changes below:  DISCUSSION: INR slightly subtherapeutic likely due to more dark green leafy veg in diet. Will increase coumadin to  10 mg today, 7.5 mg on 02-23-15, then 7.5 mg daily except 5 mg on Mondays and Fridays.  Assessment/Plan:  1.History of several episodes of PEs, IVC filter in, history of retinal bleed possibly while on coumadin, then symptomatic bilateral LE DVTs 2013 such that she resumed coumadin. OK to hold coumadin if cardiology procedures require. 2.Anemia: hemoglobin slightly lower again, may be contributing to some of other symptoms. CKD may be the primary problem, but will check iron studies, HC x3 and B12 additionally.  3.intermittent mild thrombocytopenia since at least 2011, HIT negative. Platelet count varies, a little lower today. Will follow with CBCs. Would check spleen size with [place] if consistently lower, as some question of lobular changes in liver on CT 2014.  4.morbid obesity, BMI 52 5.cardiac disease with HTN, LBBB and hx sinus bradycardia. Unable to lie supine now. She reports that she has followed all of Dr Irven Shelling initial instructions. 6.diabetes on  insulin, followed by Dr Abbott Pao 7.pulmonary HTN  8.history of gout, on allopurinol  All questions answered. Will follow up labs pending and see her sooner than presently scheduled if needed. INR monthly. TIme spent 25 min including >50% counseling and coordination of care. Cc this note to Drs Abbott Pao and Einar Gip.   LIVESAY,LENNIS P, MD   02/22/2015, 9:00 PM

## 2015-02-23 LAB — RFLX DRVVT CONFRIM: DRVVT CONFIRMATION: NEGATIVE

## 2015-02-24 DIAGNOSIS — I82403 Acute embolism and thrombosis of unspecified deep veins of lower extremity, bilateral: Secondary | ICD-10-CM | POA: Insufficient documentation

## 2015-02-24 DIAGNOSIS — N1832 Chronic kidney disease, stage 3b: Secondary | ICD-10-CM | POA: Insufficient documentation

## 2015-02-24 DIAGNOSIS — N183 Chronic kidney disease, stage 3 unspecified: Secondary | ICD-10-CM | POA: Insufficient documentation

## 2015-02-28 ENCOUNTER — Other Ambulatory Visit: Payer: Medicare HMO

## 2015-03-11 NOTE — H&P (Signed)
OFFICE VISIT NOTES COPIED TO EPIC FOR DOCUMENTATION  [name] 03/01/15 2:47 PM Location: [facility] Patient #: [id number] DOB: 1955/10/20 Divorced / Language: [name] / Race: White Female   History of Present Illness Leah Page MD; 02/25/2015 8:15 AM) Patient words: 3 week F/U for for CHF; last office visit 01/25/2015.  The patient is a 59 year old female who presents for a follow-up for Dyspnea. Patient with history of hypertension, hyperlipidemia, diabetes mellitus, chronic diastolic dysfunction, history of bilateral DVTs and history of pulmonary embolism in May 2013, has a IVC filter in place placed in 2014. She also has stage 3 chronic kidney disease, severe chronic anemia from probable GI bleed and chronic disease. Morbid obesity and history of pulmonary hypertension, I had seen her a month ago and had a long discussion regarding making lifestyle changes.  She has noticed worsening dyspnea on exertion and occasional episodes of chest pain with radiation to the jaw. She states that she is unable to do anything at home due to the above symptoms and states she is also disappointed that she has not lost weight in spite of having made significant changes to her diet after she has had discussion with me.   Problem List/Past Medical [name]; 03/01/2015 2:49 PM) Chronic diastolic heart failure (Z60.10) Echocardiogram 08/14/2011: Hyperdynamic left ventricle, LVEF 93-23%, grade 2 diastolic dysfunction, no significant valvular abnormality. Chest x-ray 01/18/2015: Cardiomegaly, increased interstitial marking stent with CHF, no significant change from 01/18/2013. Pulmonary hypertension, secondary (I27.2) Bilateral diabetic retinopathy (E11.319) Controlled type 2 diabetes mellitus with stage 3 chronic kidney disease, with long-term current use of insulin (E11.22) Labs 01/18/2015: serum glucose 101, BUN 34, creatinine 1.29, potassium 4.5, CMP otherwise  normal, Hb 10.4/HCT 33.5 with normocytic indices, CBC otherwise normal, total cholesterol 139, triglycerides 96 and HDL 48, direct LDL 84, TSH 5.832, T4 normal, HbA1c 5.6%, proBNP 4003. CBC 12/18/2014: HB 10.6/HCT 32.8. RDW 18.3, otherwise normal indicis. BUN 31, serum creatinine 1.3, eGFR 45 mL. Hypercoagulable state, primary (D68.59) On chroni coumadin and follows Dr. Tommie [name] with oncology Atrial flutter, chronic (I48.92) EKG 01/25/2015: atrial flutter (atypical) with controlled ventricular response, ventricular rate of 53 bpm. Left branch block. Abnormal EKG. BMI 45.0-49.9, adult (F57.32) Coronary artery calcification seen on CAT scan (I25.10) CT scan abdomen aortic atherosclerosis, renal artery, iliac artery atherosclerosis, scoliosis and degenerative changes of L5. CT scan of the chest 03/26/2012: No pulmonary embolism, marked dilatation of suggestive of only arterial hypertension, mild cardiomegaly, three-vessel coronary artery disease. Cardiomegaly (I51.7) Edema (R60.9) Benign essential HTN (I10) Hyperlipemia (E78.5) History of DVT (deep vein thrombosis) (Z86.718) Recurrent Hypothyroidism (E03.9) Obesity (E66.9) Iron deficiency (E61.1) Presence of IVC filter (Z95.828)10/02/2009 IVC filter 10/02/2009, Retreivable History of pulmonary embolism ([id number]  Allergies ([name]; Mar 01, 2015 2:49 PM) Morphine Derivatives Rash.  Family History [name]; 2015-03-01 2:49 PM) Mother Deceased. at age 31, from cerebral hemorrhage. Hx of Aneurysyms. Father Deceased. Died in a hunting accident when pt was a child. No health hx known Sister 2 1 younger, 1 older Brother 1 younger  Social History ([name]; 03/01/2015 2:49 PM) Current tobacco use Former smoker. Quit 2001. Pt only smoked socially Marital status Divorced. Number of Children 1. Non Drinker/No Alcohol Use Living Situation Lives alone.  Past Surgical History [name];  03-01-15 2:49 PM) Cholecystectomy Cesarean Delivery Cataract Extraction-Bilateral Right Vitrectomy 2002 IV Filter Implant 2014 [facility]  Medication History ([name]; 03-01-15 3:20 PM) Lantus SoloStar (100UNIT/ML Soln Pen-inj, 24 units Subcutaneous daily) Active. NovoLOG FlexPen (  100UNIT/ML Soln Pen-inj, 10 units Subcutaneous before ea meal) Active. Calcium Complex (2 Oral daily) Active. Multiple Vitamin (1 (one) Oral daily) Active. Hair, Skin, and Nails (1 two times daily) Active. Fish Oil (1000MG Capsule DR, 1 Oral two times daily) Active. Citalopram Hydrobromide (20MG Tablet, 1 Oral at bedtime) Active. Iron (Ferrous Gluconate) (325MG Tablet, 1 Oral daily) Active. Aspirin (81MG Tablet DR, 1 Oral daily) Active. DiazePAM (5MG Tablet, 1 Oral at bed time as needed) Active. Hydrocodone-Acetaminophen (5-325MG Tablet, 1 Oral as needed for pain) Active. Levothyroxine Sodium (50MCG Tablet, 1 then 1/2 Oral alternating days) Active. Colchicine (0.6MG Tablet, 1 Oral two times daily) Active. Isosorbide Mononitrate ER (30MG Tablet ER 24HR, 1 Oral daily) Active. Allopurinol (300MG Tablet, 1 Oral daily) Active. Benicar (40MG Tablet, 1 Oral daily) Active. Warfarin Sodium (2.5MG Tablet, 7.54m T,TH,SAT,SUN Oral 5 mg all other days) Active. ([facility]) Metoprolol Tartrate (50MG Tablet, 1 Oral two times daily) Active. Medications Reconciled (verbally with pt) Melatonin (5MG Tablet, 1/2 Oral at bedtime) Active.  Diagnostic Studies History (Leah Page MD; 02/23/2015 3:37 PM) Nuclear stress [id number] [id number] never got results Echocardiogram05/12/2011 Left ventricle: Systolic function was vigorous. The estimated ejection fraction was in the range of 65% to70%. Wall motion was normal; there were no regional wall motion abnormalities. Features are consistent with a pseudonormal left ventricular filling pattern, with  concomitant abnormal relaxation and increased filling pressure (grade 2 diastolic dysfunction). Doppler parameters are consistent with elevated mean left atrial filling pressure. Mitral valve: Calcified annulus. Left atrium: The atrium was mildly dilated. Colonoscopy2015 Within Normal Limits. Labwork Labs 01/18/2015: serum glucose 101, BUN 34, creatinine 1.29, potassium 4.5, CMP otherwise normal, Hb 10.4/HCT 33.5 with normocytic indices, CBC otherwise normal, total cholesterol 139, triglycerides 96 and HDL 48, direct LDL 84, TSH 5.832, T4 normal, HbA1c 5.6%, proBNP 4003. CBC 12/18/2014: HB 10.6/HCT 32.8. RDW 18.3, otherwise normal indicis. BUN 31, serum creatinine 1.3, eGFR 45 mL.    Review of Systems (Leah PageMD; 02/25/2015 8:16 AM) General Present- Feeling well. Not Present- Fatigue, Fever and Night Sweats. Skin Not Present- Itching and Rash. HEENT Not Present- Headache. Respiratory Present- Decreased Exercise Tolerance and Difficulty Breathing on Exertion. Not Present- Wakes up from Sleep Wheezing or Short of Breath and Wheezing. Cardiovascular Present- Difficulty Breathing Lying Down, Difficulty Breathing On Exertion and Edema. Not Present- Claudications, Fainting, Orthopnea and Swelling of Extremities. Gastrointestinal Not Present- Abdominal Pain, Black, Tarry Stool, Bloody Stool, Constipation, Diarrhea, Nausea and Vomiting. Musculoskeletal Present- Joint Pain (DJD). Not Present- Joint Swelling. Neurological Not Present- Headaches. Hematology Not Present- Blood Clots, Easy Bruising and Nose Bleed.  Vitals (Leah MachoReader; 02/23/2015 2:55 PM) 02/23/2015 2:50 PM Weight: 290 lb Height: 66in Body Surface Area: 2.34 m Body Mass Index: 46.81 kg/m  Pulse: 54 (Regular)  P.OX: 97% (Room air) BP: 154/90 (Sitting, Left Arm, Standard)       Physical Exam (Leah PageMD; 02/25/2015 8:21 AM) General Mental Status-Alert. General Appearance-Cooperative,  Appears stated age, Not in acute distress. Orientation-Oriented X3. Build & Nutrition-Moderately built and Morbidly obese.  Head and Neck Thyroid Gland Characteristics - no palpable nodules, no palpable enlargement.  Chest and Lung Exam Chest and lung exam reveals -on auscultation, normal breath sounds, no adventitious sounds and normal vocal resonance. Palpation Tender - No chest wall tenderness.  Cardiovascular Cardiovascular examination reveals -normal heart sounds, regular rate and rhythm with no murmurs. Inspection Jugular vein - Right - No Distention.  Abdomen Inspection Contour -  Pannus present. Palpation/Percussion Palpation and Percussion of the abdomen reveal - Non Tender and No hepatosplenomegaly. Auscultation Auscultation of the abdomen reveals - Bowel sounds normal.  Peripheral Vascular Lower Extremity Inspection - Bilateral - Pigmented(chronic peu-de-orange skin with mixture of pitting and non-pitting edema. Thick skin.), No Varicose veins. Palpation - Edema - Left - No edema. Right - No edema. Femoral pulse - Bilateral - Feeble(Pulsus difficult to feel due to patient's bodily habitus.). Popliteal pulse - Bilateral - Absent(Pulsus difficult to feel due to patient's bodily habitus.). Dorsalis pedis pulse - Bilateral - Absent(difficult to feel due to leg edema). Posterior tibial pulse - Bilateral - Absent(difficult to feel due to leg edema). Carotid arteries - Bilateral-No Carotid bruit. Abdomen-No epigastric bruit.  Neurologic Motor-Grossly intact without any focal deficits.  Musculoskeletal - Did not examine.    Assessment & Plan Leah Page MD; 02/25/2015 8:22 AM) Angina pectoris (I20.9) Current Plans Started Nitrostat 0.4MG, 1 (one) Tablet every 5 minutes as needed for chest pain., #25, 02/23/2015, No Refill. Future Plans 40/11/6759: METABOLIC PANEL, BASIC (95093) - one time 03/05/2015: CBC & PLATELETS (AUTO) (26712) - one  time 03/05/2015: PT (PROTHROMBIN TIME) (45809) - one time Chronic diastolic heart failure (X83.38) Story: Echocardiogram 08/14/2011: Hyperdynamic left ventricle, LVEF 25-05%, grade 2 diastolic dysfunction, no significant valvular abnormality.  Chest x-ray 01/18/2015: Cardiomegaly, increased interstitial marking stent with CHF, no significant change from 01/18/2013. Coronary artery calcification seen on CAT scan (I25.10) Story: CT scan abdomen aortic atherosclerosis, renal artery, iliac artery atherosclerosis, scoliosis and degenerative changes of L5.  CT scan of the chest 03/26/2012: No pulmonary embolism, marked dilatation of suggestive of only arterial hypertension, mild cardiomegaly, three-vessel coronary artery disease. Atrial flutter, chronic (I48.92) Story: EKG 01/25/2015: atrial flutter (atypical) with controlled ventricular response, ventricular rate of 53 bpm. Left branch block. Abnormal EKG. Pulmonary hypertension, secondary (I27.2) History of pulmonary embolism (Z86.711) Presence of IVC filter (L97.673) Story: IVC filter 10/02/2009, Retreivable Controlled type 2 diabetes mellitus with stage 3 chronic kidney disease, with long-term current use of insulin (E11.22) Hypercoagulable state, primary (A19.37) Story: On chroni coumadin and follows Dr. Tommie [name] with oncology Labwork Story: Labs 01/18/2015: serum glucose 101, BUN 34, creatinine 1.29, potassium 4.5, CMP otherwise normal, Hb 10.4/HCT 33.5 with normocytic indices, CBC otherwise normal, total cholesterol 139, triglycerides 96 and HDL 48, direct LDL 84, TSH 5.832, T4 normal, HbA1c 5.6%, proBNP 4003.  CBC 12/18/2014: HB 10.6/HCT 32.8. RDW 18.3, otherwise normal indicis. BUN 31, serum creatinine 1.3, eGFR 45 mL. BMI 45.0-49.9, adult (T02.40) Current Plans Mechanism of underlying disease process and action of medications discussed with the patient. I discussed primary/secondary prevention and also dietary counceling was done. Patient was  referred to me for evaluation of dyspnea on exertion and chest discomfort, I had last seen her about a month ago. Patient states that she has tried her best to make changes to her diet, still has not lost weight and she feels very frustrated. But she clearly has made an attempt.  He continues to have marked dyspnea on exertion even with minimal activity. She continues to have severe chest tightness which radiates to her jaw on activity. Symptoms are very consistent with worsening angina pectoris. Due to her presentation and multiple cardiovascular risk factors I suspect we should proceed directly with coronary angiography along with right heart catheterization to evaluate both coronary status and pulmonary hypertension. I have extensively discussed with her regarding the risk of contrast nephropathy. We will hold off on the colchicine and and Benicar for  48 hours prior to angiography. She understands stage procedure. Office visit after the test. She was encouraged to continue with her dietary changes that she has made. This was a greater than 40 minute office visit with greater than 50% of the time spent with face-to-face encounter with patient and evaluation of complex medical issues. I will not hold coumadin, will perform heart cath through radial artery and AC vein access.  Addendum Note(Bridgette Allison AGNP-C; 03/07/2015 7:52 PM) 02/08/2015: BUN 35, creatinine 1.39, eGFR 42, potassium 5.2, CBC normal, PT 18, INR 1.7  Signed by Leah Page, MD (02/25/2015 8:22 AM)

## 2015-03-13 ENCOUNTER — Encounter (HOSPITAL_COMMUNITY)
Admission: RE | Disposition: A | Discharge status: Home / Self Care | Payer: Self-pay | Source: Ambulatory Visit | Attending: Cardiology

## 2015-03-13 ENCOUNTER — Ambulatory Visit (HOSPITAL_COMMUNITY)
Admission: RE | Admit: 2015-03-13 | Discharge: 2015-03-13 | Disposition: A | Discharge status: Home / Self Care | Payer: Medicare HMO | Source: Ambulatory Visit | Attending: Cardiology | Admitting: Cardiology

## 2015-03-13 DIAGNOSIS — E1122 Type 2 diabetes mellitus with diabetic chronic kidney disease: Secondary | ICD-10-CM | POA: Diagnosis not present

## 2015-03-13 DIAGNOSIS — I5032 Chronic diastolic (congestive) heart failure: Secondary | ICD-10-CM | POA: Diagnosis not present

## 2015-03-13 DIAGNOSIS — I272 Other secondary pulmonary hypertension: Secondary | ICD-10-CM | POA: Diagnosis present

## 2015-03-13 DIAGNOSIS — E039 Hypothyroidism, unspecified: Secondary | ICD-10-CM | POA: Insufficient documentation

## 2015-03-13 DIAGNOSIS — N183 Chronic kidney disease, stage 3 (moderate): Secondary | ICD-10-CM | POA: Diagnosis not present

## 2015-03-13 DIAGNOSIS — E1159 Type 2 diabetes mellitus with other circulatory complications: Secondary | ICD-10-CM | POA: Diagnosis not present

## 2015-03-13 DIAGNOSIS — I209 Angina pectoris, unspecified: Secondary | ICD-10-CM | POA: Diagnosis not present

## 2015-03-13 DIAGNOSIS — Z87891 Personal history of nicotine dependence: Secondary | ICD-10-CM | POA: Diagnosis not present

## 2015-03-13 DIAGNOSIS — Z86718 Personal history of other venous thrombosis and embolism: Secondary | ICD-10-CM | POA: Diagnosis not present

## 2015-03-13 DIAGNOSIS — E785 Hyperlipidemia, unspecified: Secondary | ICD-10-CM | POA: Insufficient documentation

## 2015-03-13 DIAGNOSIS — Z86711 Personal history of pulmonary embolism: Secondary | ICD-10-CM | POA: Diagnosis not present

## 2015-03-13 DIAGNOSIS — I129 Hypertensive chronic kidney disease with stage 1 through stage 4 chronic kidney disease, or unspecified chronic kidney disease: Secondary | ICD-10-CM | POA: Diagnosis not present

## 2015-03-13 DIAGNOSIS — I4892 Unspecified atrial flutter: Secondary | ICD-10-CM | POA: Diagnosis not present

## 2015-03-13 HISTORY — PX: CARDIAC CATHETERIZATION: SHX172

## 2015-03-13 LAB — POCT I-STAT 3, ART BLOOD GAS (G3+)
ACID-BASE EXCESS: 1 mmol/L (ref 0.0–2.0)
Bicarbonate: 26.2 mEq/L — ABNORMAL HIGH (ref 20.0–24.0)
O2 SAT: 97 %
PH ART: 7.41 (ref 7.350–7.450)
TCO2: 27 mmol/L (ref 0–100)
pCO2 arterial: 41.3 mmHg (ref 35.0–45.0)
pO2, Arterial: 95 mmHg (ref 80.0–100.0)

## 2015-03-13 LAB — GLUCOSE, CAPILLARY
GLUCOSE-CAPILLARY: 88 mg/dL (ref 65–99)
GLUCOSE-CAPILLARY: 98 mg/dL (ref 65–99)
Glucose-Capillary: 83 mg/dL (ref 65–99)

## 2015-03-13 LAB — POCT I-STAT 3, VENOUS BLOOD GAS (G3P V)
ACID-BASE EXCESS: 2 mmol/L (ref 0.0–2.0)
BICARBONATE: 25.6 meq/L — AB (ref 20.0–24.0)
Bicarbonate: 27.5 mEq/L — ABNORMAL HIGH (ref 20.0–24.0)
O2 SAT: 77 %
O2 Saturation: 76 %
PO2 VEN: 42 mmHg (ref 30.0–45.0)
PO2 VEN: 42 mmHg (ref 30.0–45.0)
TCO2: 29 mmol/L (ref 0–100)
TCO2: 27 mmol/L (ref 0–100)
pCO2, Ven: 43.8 mmHg — ABNORMAL LOW (ref 45.0–50.0)
pCO2, Ven: 45.5 mmHg (ref 45.0–50.0)
pH, Ven: 7.374 — ABNORMAL HIGH (ref 7.250–7.300)
pH, Ven: 7.389 — ABNORMAL HIGH (ref 7.250–7.300)

## 2015-03-13 LAB — PROTIME-INR
INR: 2.03 — ABNORMAL HIGH (ref 0.00–1.49)
PROTHROMBIN TIME: 22.8 s — AB (ref 11.6–15.2)

## 2015-03-13 SURGERY — RIGHT/LEFT HEART CATH AND CORONARY ANGIOGRAPHY

## 2015-03-13 MED ORDER — SODIUM CHLORIDE 0.9 % IJ SOLN: 3.0000 mL | INTRAMUSCULAR | Status: DC | PRN

## 2015-03-13 MED ORDER — FENTANYL CITRATE (PF) 100 MCG/2ML IJ SOLN
INTRAMUSCULAR | Status: AC
  Filled 2015-03-13: qty 2

## 2015-03-13 MED ORDER — LIDOCAINE HCL (PF) 1 % IJ SOLN
INTRAMUSCULAR | Status: AC
  Filled 2015-03-13: qty 30

## 2015-03-13 MED ORDER — NITROGLYCERIN 1 MG/10 ML FOR IR/CATH LAB
INTRA_ARTERIAL | Status: AC
  Filled 2015-03-13: qty 10

## 2015-03-13 MED ORDER — HEPARIN SODIUM (PORCINE) 1000 UNIT/ML IJ SOLN
INTRAMUSCULAR | Status: AC
  Filled 2015-03-13: qty 1

## 2015-03-13 MED ORDER — SODIUM CHLORIDE 0.9 % WEIGHT BASED INFUSION
3.0000 mL/kg/h | INTRAVENOUS | Status: DC
  Administered 2015-03-13: 3 mL/kg/h via INTRAVENOUS

## 2015-03-13 MED ORDER — ASPIRIN 81 MG PO CHEW: 81.0000 mg | CHEWABLE_TABLET | ORAL | Status: DC

## 2015-03-13 MED ORDER — IOHEXOL 350 MG/ML SOLN
INTRAVENOUS | Status: DC | PRN
  Administered 2015-03-13: 30 mL via INTRA_ARTERIAL

## 2015-03-13 MED ORDER — SODIUM CHLORIDE 0.9 % IV SOLN: 250.0000 mL | INTRAVENOUS | Status: DC | PRN

## 2015-03-13 MED ORDER — SODIUM CHLORIDE 0.9 % IJ SOLN: 3.0000 mL | Freq: Two times a day (BID) | INTRAMUSCULAR | Status: DC

## 2015-03-13 MED ORDER — NITROGLYCERIN 1 MG/10 ML FOR IR/CATH LAB
INTRA_ARTERIAL | Status: DC | PRN
  Administered 2015-03-13: 8 mL

## 2015-03-13 MED ORDER — VERAPAMIL HCL 2.5 MG/ML IV SOLN
INTRAVENOUS | Status: AC
  Filled 2015-03-13: qty 2

## 2015-03-13 MED ORDER — SODIUM CHLORIDE 0.9 % WEIGHT BASED INFUSION: 3.0000 mL/kg/h | INTRAVENOUS | Status: DC

## 2015-03-13 MED ORDER — MIDAZOLAM HCL 2 MG/2ML IJ SOLN
INTRAMUSCULAR | Status: DC | PRN
  Administered 2015-03-13: 2 mg via INTRAVENOUS

## 2015-03-13 MED ORDER — MIDAZOLAM HCL 2 MG/2ML IJ SOLN
INTRAMUSCULAR | Status: AC
  Filled 2015-03-13: qty 2

## 2015-03-13 MED ORDER — FENTANYL CITRATE (PF) 100 MCG/2ML IJ SOLN
INTRAMUSCULAR | Status: DC | PRN
  Administered 2015-03-13: 25 ug via INTRAVENOUS

## 2015-03-13 MED ORDER — HEPARIN (PORCINE) IN NACL 2-0.9 UNIT/ML-% IJ SOLN
INTRAMUSCULAR | Status: AC
  Filled 2015-03-13: qty 1500

## 2015-03-13 MED ORDER — VERAPAMIL HCL 2.5 MG/ML IV SOLN
INTRA_ARTERIAL | Status: DC | PRN
  Administered 2015-03-13: 5 mL via INTRA_ARTERIAL

## 2015-03-13 MED ORDER — HEPARIN (PORCINE) IN NACL 2-0.9 UNIT/ML-% IJ SOLN
INTRAMUSCULAR | Status: AC
  Filled 2015-03-13: qty 1000

## 2015-03-13 MED ORDER — SODIUM CHLORIDE 0.9 % WEIGHT BASED INFUSION: 1.0000 mL/kg/h | INTRAVENOUS | Status: DC

## 2015-03-13 SURGICAL SUPPLY — 12 items
CATH BALLN WEDGE 5F 110CM (CATHETERS) ×3 IMPLANT
CATH OPTITORQUE TIG 4.0 5F (CATHETERS) ×3 IMPLANT
DEVICE RAD COMP TR BAND LRG (VASCULAR PRODUCTS) ×3 IMPLANT
GLIDESHEATH SLEND A-KIT 6F 20G (SHEATH) ×3 IMPLANT
GUIDEWIRE .025 260CM (WIRE) ×3 IMPLANT
KIT HEART LEFT (KITS) ×3 IMPLANT
KIT HEART RIGHT NAMIC (KITS) IMPLANT
PACK CARDIAC CATHETERIZATION (CUSTOM PROCEDURE TRAY) ×3 IMPLANT
SHEATH FAST CATH BRACH 5F 5CM (SHEATH) ×3 IMPLANT
TRANSDUCER W/STOPCOCK (MISCELLANEOUS) ×3 IMPLANT
TUBING CIL FLEX 10 FLL-RA (TUBING) ×3 IMPLANT
WIRE SAFE-T 1.5MM-J .035X260CM (WIRE) ×6 IMPLANT

## 2015-03-13 NOTE — Progress Notes (Signed)
INR is 2.03, Dr Einar Gip was paged, [facility] Knife Rad tech returned call and Dr Einar Gip is aware, no orders given.

## 2015-03-13 NOTE — Discharge Instructions (Signed)
Radial Site Care °Refer to this sheet in the next few weeks. These instructions provide you with information about caring for yourself after your procedure. Your health care provider may also give you more specific instructions. Your treatment has been planned according to current medical practices, but problems sometimes occur. Call your health care provider if you have any problems or questions after your procedure. °WHAT TO EXPECT AFTER THE PROCEDURE °After your procedure, it is typical to have the following: °· Bruising at the radial site that usually fades within 1-2 weeks. °· Blood collecting in the tissue (hematoma) that may be painful to the touch. It should usually decrease in size and tenderness within 1-2 weeks. °HOME CARE INSTRUCTIONS °· Take medicines only as directed by your health care provider. °· You may shower 24-48 hours after the procedure or as directed by your health care provider. Remove the bandage (dressing) and gently wash the site with plain soap and water. Pat the area dry with a clean towel. Do not rub the site, because this may cause bleeding. °· Do not take baths, swim, or use a hot tub until your health care provider approves. °· Check your insertion site every day for redness, swelling, or drainage. °· Do not apply powder or lotion to the site. °· Do not flex or bend the affected arm for 24 hours or as directed by your health care provider. °· Do not push or pull heavy objects with the affected arm for 24 hours or as directed by your health care provider. °· Do not lift over 10 lb (4.5 kg) for 5 days after your procedure or as directed by your health care provider. °· Ask your health care provider when it is okay to: °¨ Return to work or school. °¨ Resume usual physical activities or sports. °¨ Resume sexual activity. °· Do not drive home if you are discharged the same day as the procedure. Have someone else drive you. °· You may drive 24 hours after the procedure unless otherwise  instructed by your health care provider. °· Do not operate machinery or power tools for 24 hours after the procedure. °· If your procedure was done as an outpatient procedure, which means that you went home the same day as your procedure, a responsible adult should be with you for the first 24 hours after you arrive home. °· Keep all follow-up visits as directed by your health care provider. This is important. °SEEK MEDICAL CARE IF: °· You have a fever. °· You have chills. °· You have increased bleeding from the radial site. Hold pressure on the site. °SEEK IMMEDIATE MEDICAL CARE IF: °· You have unusual pain at the radial site. °· You have redness, warmth, or swelling at the radial site. °· You have drainage (other than a small amount of blood on the dressing) from the radial site. °· The radial site is bleeding, and the bleeding does not stop after 30 minutes of holding steady pressure on the site. °· Your arm or hand becomes pale, cool, tingly, or numb. °  °This information is not intended to replace advice given to you by your health care provider. Make sure you discuss any questions you have with your health care provider. °  °Document Released: 04/26/2010 Document Revised: 04/14/2014 Document Reviewed: 10/10/2013 °Elsevier Interactive Patient Education ©2016 [employer] ° °

## 2015-03-13 NOTE — Interval H&P Note (Signed)
History and Physical Interval Note:  03/13/2015 12:17 PM  [name]  has presented today for surgery, with the diagnosis of chf  The various methods of treatment have been discussed with the patient and family. After consideration of risks, benefits and other options for treatment, the patient has consented to  Procedure(s): Right/Left Heart Cath and Coronary Angiography (N/A) and possible PCI  as a surgical intervention .  The patient's history has been reviewed, patient examined, no change in status, stable for surgery.  I have reviewed the patient's chart and labs.  Questions were answered to the patient's satisfaction.     Adrian Prows

## 2015-03-14 ENCOUNTER — Encounter (HOSPITAL_COMMUNITY): Payer: Self-pay | Admitting: Cardiology

## 2015-03-21 ENCOUNTER — Other Ambulatory Visit: Payer: Self-pay | Admitting: Oncology

## 2015-03-21 DIAGNOSIS — Z7901 Long term (current) use of anticoagulants: Secondary | ICD-10-CM

## 2015-03-21 DIAGNOSIS — I82403 Acute embolism and thrombosis of unspecified deep veins of lower extremity, bilateral: Secondary | ICD-10-CM

## 2015-03-29 ENCOUNTER — Telehealth: Payer: Self-pay | Admitting: Oncology

## 2015-03-29 ENCOUNTER — Other Ambulatory Visit: Payer: Medicare HMO

## 2015-03-29 NOTE — Telephone Encounter (Signed)
Patient called in and rescheduled her appointments as she is sick

## 2015-04-03 ENCOUNTER — Telehealth: Payer: Self-pay

## 2015-04-03 ENCOUNTER — Other Ambulatory Visit (HOSPITAL_BASED_OUTPATIENT_CLINIC_OR_DEPARTMENT_OTHER): Payer: Medicare HMO

## 2015-04-03 ENCOUNTER — Other Ambulatory Visit: Payer: Self-pay | Admitting: Oncology

## 2015-04-03 ENCOUNTER — Encounter: Payer: Self-pay | Admitting: Oncology

## 2015-04-03 DIAGNOSIS — D649 Anemia, unspecified: Secondary | ICD-10-CM

## 2015-04-03 DIAGNOSIS — R2689 Other abnormalities of gait and mobility: Secondary | ICD-10-CM | POA: Insufficient documentation

## 2015-04-03 DIAGNOSIS — Z86711 Personal history of pulmonary embolism: Secondary | ICD-10-CM | POA: Diagnosis not present

## 2015-04-03 DIAGNOSIS — IMO0002 Reserved for concepts with insufficient information to code with codable children: Secondary | ICD-10-CM

## 2015-04-03 LAB — IRON AND TIBC
%SAT: 19 % — ABNORMAL LOW (ref 21–57)
IRON: 57 ug/dL (ref 41–142)
TIBC: 294 ug/dL (ref 236–444)
UIBC: 237 ug/dL (ref 120–384)

## 2015-04-03 LAB — PROTIME-INR
INR: 2 (ref 2.00–3.50)
PROTIME: 24 s — AB (ref 10.6–13.4)

## 2015-04-03 NOTE — Progress Notes (Unsigned)
Documentation  The following diagnoses have been entered into problem list, for B12 level   Personal history of other endocrine, metabolic and nutritional disease Other abnormalities of gait and mobility   L.Marko Plume, MD

## 2015-04-03 NOTE — Telephone Encounter (Signed)
PT/INR 04-03-15 is 2.0 LM that Dr. Marko Plume said to continue the same dose of 7.5 mg daily except 5 mg on Monday and Friday. PT/INR to be repeated on 04-26-15 as scheduled. She can call the office Wednesday 04-04-15 if any questions or concerns.

## 2015-04-05 LAB — VITAMIN B12: Vitamin B-12: 685 pg/mL (ref 211–911)

## 2015-04-05 LAB — ERYTHROPOIETIN: ERYTHROPOIETIN: 69.3 m[IU]/mL — AB (ref 2.6–18.5)

## 2015-04-25 ENCOUNTER — Other Ambulatory Visit: Payer: Self-pay

## 2015-04-25 DIAGNOSIS — Z7901 Long term (current) use of anticoagulants: Secondary | ICD-10-CM

## 2015-04-26 ENCOUNTER — Telehealth: Payer: Self-pay

## 2015-04-26 ENCOUNTER — Other Ambulatory Visit (HOSPITAL_BASED_OUTPATIENT_CLINIC_OR_DEPARTMENT_OTHER): Payer: Medicare HMO

## 2015-04-26 ENCOUNTER — Other Ambulatory Visit: Payer: Self-pay | Admitting: Oncology

## 2015-04-26 DIAGNOSIS — Z7901 Long term (current) use of anticoagulants: Secondary | ICD-10-CM

## 2015-04-26 DIAGNOSIS — Z86711 Personal history of pulmonary embolism: Secondary | ICD-10-CM

## 2015-04-26 LAB — PROTIME-INR
INR: 1.8 — AB (ref 2.00–3.50)
PROTIME: 21.6 s — AB (ref 10.6–13.4)

## 2015-04-26 NOTE — Telephone Encounter (Signed)
lvm that per Dr Edwyna Shell she is to take 10 mg warfarin (4 tablets) tonight and then 7.5 mg daily. We need to recheck labs in 2 weeks which will be approx  2/2. She will receive call from the schedulers. Please call if any questions.

## 2015-04-27 ENCOUNTER — Telehealth: Payer: Self-pay | Admitting: Oncology

## 2015-04-27 NOTE — Telephone Encounter (Signed)
Called and left a message with 2 week lab  anne

## 2015-05-10 ENCOUNTER — Telehealth: Payer: Self-pay

## 2015-05-10 ENCOUNTER — Other Ambulatory Visit (HOSPITAL_BASED_OUTPATIENT_CLINIC_OR_DEPARTMENT_OTHER): Payer: Medicare HMO

## 2015-05-10 DIAGNOSIS — Z86711 Personal history of pulmonary embolism: Secondary | ICD-10-CM | POA: Diagnosis not present

## 2015-05-10 LAB — PROTIME-INR
INR: 2.4 (ref 2.00–3.50)
PROTIME: 28.8 s — AB (ref 10.6–13.4)

## 2015-05-10 NOTE — Telephone Encounter (Signed)
lvm to continue coumadin 7.5 mg daily, recheck as scheduled 2/16

## 2015-05-24 ENCOUNTER — Other Ambulatory Visit (HOSPITAL_BASED_OUTPATIENT_CLINIC_OR_DEPARTMENT_OTHER): Payer: Medicare HMO

## 2015-05-24 ENCOUNTER — Telehealth: Payer: Self-pay

## 2015-05-24 DIAGNOSIS — Z86711 Personal history of pulmonary embolism: Secondary | ICD-10-CM

## 2015-05-24 LAB — PROTIME-INR
INR: 2.7 (ref 2.00–3.50)
Protime: 32.4 s — ABNORMAL HIGH (ref 10.6–13.4)

## 2015-05-24 NOTE — Telephone Encounter (Signed)
lvm re maintain same coumadin (7.5 mg daily). Keep appt as scheduled 3/16 per LL

## 2015-06-08 ENCOUNTER — Other Ambulatory Visit: Payer: Self-pay

## 2015-06-08 DIAGNOSIS — Z7901 Long term (current) use of anticoagulants: Secondary | ICD-10-CM

## 2015-06-08 MED ORDER — WARFARIN SODIUM 2.5 MG PO TABS: ORAL_TABLET | ORAL | Status: DC

## 2015-06-20 ENCOUNTER — Other Ambulatory Visit: Payer: Self-pay

## 2015-06-20 DIAGNOSIS — Z1231 Encounter for screening mammogram for malignant neoplasm of breast: Secondary | ICD-10-CM

## 2015-06-21 ENCOUNTER — Other Ambulatory Visit: Payer: Medicare HMO

## 2015-06-28 ENCOUNTER — Telehealth: Payer: Self-pay

## 2015-06-28 ENCOUNTER — Other Ambulatory Visit (HOSPITAL_BASED_OUTPATIENT_CLINIC_OR_DEPARTMENT_OTHER): Payer: Medicare HMO

## 2015-06-28 DIAGNOSIS — Z86711 Personal history of pulmonary embolism: Secondary | ICD-10-CM

## 2015-06-28 LAB — PROTIME-INR
INR: 4.6 — AB (ref 2.00–3.50)
PROTIME: 55.2 s — AB (ref 10.6–13.4)

## 2015-06-28 NOTE — Telephone Encounter (Signed)
Called pt and she has no bleeding problems, no dietary changes, she did take prednisone but has finished that almost 2 weeks ago. She is taking 7.5 mg daily. Information given to Dr Edwyna Shell  Called pt back with instructions. Hold coumadin today. Take 5 mg on T, Th, and Sat. Take 7.5 mg every other day. Recheck on 3/30. Pt wrote down instructions and spoke back.

## 2015-06-28 NOTE — Telephone Encounter (Signed)
-----   Message from Gordy Levan, MD sent at 06/28/2015  1:04 PM EDT ----- Labs seen and need follow up: please check on her by phone, as INR high. Confirm present coumadin dose (per EMR 7.5 mg daily), ask if any bleeding, ask if any recent antibiotics. Will hold coumadin at least today. Let me know other information above and we will make rest of plan from there Thank you

## 2015-06-29 ENCOUNTER — Telehealth: Payer: Self-pay | Admitting: Oncology

## 2015-06-29 NOTE — Telephone Encounter (Signed)
s.w. pt and advised on 3.30 appt.Marland KitchenMarland KitchenMarland KitchenMarland Kitchenpt ok and aware

## 2015-07-05 ENCOUNTER — Other Ambulatory Visit (HOSPITAL_BASED_OUTPATIENT_CLINIC_OR_DEPARTMENT_OTHER): Payer: Medicare HMO

## 2015-07-05 ENCOUNTER — Telehealth: Payer: Self-pay

## 2015-07-05 DIAGNOSIS — Z86711 Personal history of pulmonary embolism: Secondary | ICD-10-CM

## 2015-07-05 LAB — PROTIME-INR
INR: 2.9 (ref 2.00–3.50)
PROTIME: 34.8 s — AB (ref 10.6–13.4)

## 2015-07-05 NOTE — Telephone Encounter (Signed)
S/w pt that Dr Edwyna Shell wants her to take 5 mg warfarin T, Th, Sat, Sun and to take 7.5 mg on M, W, F.  Pt spoke back directions. F/u INR is 4/13.

## 2015-07-09 ENCOUNTER — Ambulatory Visit
Admission: RE | Admit: 2015-07-09 | Discharge: 2015-07-09 | Disposition: A | Discharge status: Home / Self Care | Payer: Medicare HMO | Source: Ambulatory Visit

## 2015-07-09 DIAGNOSIS — Z1231 Encounter for screening mammogram for malignant neoplasm of breast: Secondary | ICD-10-CM

## 2015-07-17 ENCOUNTER — Other Ambulatory Visit: Payer: Self-pay | Admitting: Oncology

## 2015-07-17 DIAGNOSIS — Z7901 Long term (current) use of anticoagulants: Secondary | ICD-10-CM

## 2015-07-19 ENCOUNTER — Other Ambulatory Visit: Payer: Medicare HMO

## 2015-07-19 ENCOUNTER — Other Ambulatory Visit (HOSPITAL_BASED_OUTPATIENT_CLINIC_OR_DEPARTMENT_OTHER): Payer: Medicare HMO

## 2015-07-19 ENCOUNTER — Encounter: Payer: Self-pay | Admitting: Oncology

## 2015-07-19 ENCOUNTER — Ambulatory Visit (HOSPITAL_BASED_OUTPATIENT_CLINIC_OR_DEPARTMENT_OTHER): Payer: Medicare HMO | Admitting: Oncology

## 2015-07-19 VITALS — BP 162/68 | HR 62 | Temp 97.5°F | Resp 18 | Ht 63.0 in | Wt 292.7 lb

## 2015-07-19 DIAGNOSIS — N189 Chronic kidney disease, unspecified: Secondary | ICD-10-CM | POA: Diagnosis not present

## 2015-07-19 DIAGNOSIS — D649 Anemia, unspecified: Secondary | ICD-10-CM | POA: Diagnosis not present

## 2015-07-19 DIAGNOSIS — Z7901 Long term (current) use of anticoagulants: Secondary | ICD-10-CM

## 2015-07-19 DIAGNOSIS — I272 Other secondary pulmonary hypertension: Secondary | ICD-10-CM | POA: Diagnosis not present

## 2015-07-19 DIAGNOSIS — Z86711 Personal history of pulmonary embolism: Secondary | ICD-10-CM

## 2015-07-19 DIAGNOSIS — D696 Thrombocytopenia, unspecified: Secondary | ICD-10-CM

## 2015-07-19 DIAGNOSIS — I82409 Acute embolism and thrombosis of unspecified deep veins of unspecified lower extremity: Secondary | ICD-10-CM

## 2015-07-19 DIAGNOSIS — Z86718 Personal history of other venous thrombosis and embolism: Secondary | ICD-10-CM

## 2015-07-19 DIAGNOSIS — E119 Type 2 diabetes mellitus without complications: Secondary | ICD-10-CM

## 2015-07-19 DIAGNOSIS — Z95828 Presence of other vascular implants and grafts: Secondary | ICD-10-CM

## 2015-07-19 LAB — CBC WITH DIFFERENTIAL/PLATELET
BASO%: 0.3 % (ref 0.0–2.0)
BASOS ABS: 0 10*3/uL (ref 0.0–0.1)
EOS%: 4 % (ref 0.0–7.0)
Eosinophils Absolute: 0.2 10*3/uL (ref 0.0–0.5)
HCT: 34.5 % — ABNORMAL LOW (ref 34.8–46.6)
HEMOGLOBIN: 10.7 g/dL — AB (ref 11.6–15.9)
LYMPH#: 0.6 10*3/uL — AB (ref 0.9–3.3)
LYMPH%: 10.5 % — ABNORMAL LOW (ref 14.0–49.7)
MCH: 30.9 pg (ref 25.1–34.0)
MCHC: 31 g/dL — ABNORMAL LOW (ref 31.5–36.0)
MCV: 99.7 fL (ref 79.5–101.0)
MONO#: 0.6 10*3/uL (ref 0.1–0.9)
MONO%: 10.7 % (ref 0.0–14.0)
NEUT#: 4.3 10*3/uL (ref 1.5–6.5)
NEUT%: 74.5 % (ref 38.4–76.8)
Platelets: 116 10*3/uL — ABNORMAL LOW (ref 145–400)
RBC: 3.46 10*6/uL — AB (ref 3.70–5.45)
RDW: 16.6 % — AB (ref 11.2–14.5)
WBC: 5.8 10*3/uL (ref 3.9–10.3)
nRBC: 0 % (ref 0–0)

## 2015-07-19 LAB — TECHNOLOGIST REVIEW

## 2015-07-19 LAB — PROTIME-INR
INR: 2.1 (ref 2.00–3.50)
PROTIME: 25.2 s — AB (ref 10.6–13.4)

## 2015-07-19 NOTE — Progress Notes (Signed)
OFFICE PROGRESS NOTE   July 19, 2015   Physicians: R.Moriera,( M.Croitoru) P.Benson Norway, G.Truslow, J.Ganji  INTERVAL HISTORY:  Patient is seen, alone for visit, continuing coumadin for history of recurrent PEs and DVT. INR is therapeutic today and she has had had no bleeding and no obvious concern for recurrent clotting.  Platelets have been variably a little low for several years.  Patient has had no new problems or unusual exacerbations of chronic problems in several months. Weight is up in past few weeks with increased LE edema and some wheezing, generally increases diuretics on prn basis for symptoms. She denies SOB coming into office today, denies chest pain.  She denies unusual bruising, tho frequently bruises at sites of insulin injections. She denies abdominal pain, change in bowels, recent fever or symptoms of infection. Environmental allergies are noticeable, could try claritin without decongestant; blood sugars ~ 111 fasting, so likely could use OTC flonase/ nasocort also. She has been able to plant her garden.  Remainder of 10 point Review of Systems negative/ unchanged.   ONCOLOGIC HISTORY She has a history of pulmonary emboli on ~ 3-4 occasions dating back to 2004, had retinal bleed when on coumadin previously, and has IVC filter in (09-2009). She was off coumadin from time of the retinal bleed until this was resumed 2013. She had mild thrombocytopenia during hospitalization in 2013, HIT negative and not significantly different than counts back to at least 2011. 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 Goal INR with this history is 2.0 - 2.2.  Patient has multiple other significant co morbidities, including morbid obesity, HTN/ LBBB/ sinus bradycardia, pulmonary HTN, diabetes on insulin, stage IV chronic renal insufficiency,and anemia related to CRF/chronic disease/ bleeding from  rectal fissure in 2013 and internal hemorrhoids in 01-2013. Other evaluation for hypercoagulable condition has included labs 12-2014: Factor V Leiden, lupus anticoagulant, beta 2 glycoprotein and ATIII all WNL; anticardiolipin IgM 12 (normal <11, low + 12-20), not clearly significant. [facility] with Dr Beryle Beams not taking new patients  LE venous dopplers 01-26-15 were negative for DVT    Objective:  Vital signs in last 24 hours:  BP 162/68 mmHg  Pulse 62  Temp(Src) 97.5 F (36.4 C) (Oral)  Resp 18  Ht 5\' 3"  (1.6 m)  Wt 292 lb 11.2 oz (132.768 kg)  BMI 51.86 kg/m2  SpO2 96% Weight down 2 lbs from 02-2015 but up 8 lbs recently per patient. Alert, oriented and appropriate. Ambulatory slowly without assistance.   HEENT:PERRL, sclerae not icteric. Oral mucosa moist without lesions, posterior pharynx minimal dull erythema consistent with post nasal drainage, nasal turbinate without purulent drainage.  Neck supple. No JVD.  Lymphatics:no cervical,supraclavicular adenopathy Resp: clear to auscultation bilaterally  Cardio: regular rate and rhythm. No gallop. GI: abdomen obese, soft, nontender, not distended, cannot appreciate mass or HSM. Normally active bowel sounds.  Musculoskeletal/ Extremities: Lower legs with chronic venous stasis changes, tight with some fluid bullae tho no weeping,  and without clear cords, tenderness Neuro: no focal deficits. PSYCH appropriate mood and affect Skin without rash, petechiae. Resolving small ecchymoses anterior abdomen at sites of insulin injections.   Lab Results:  Results for orders placed or performed in visit on 07/19/15  TECHNOLOGIST REVIEW  Result Value Ref Range   Technologist Review few rbc fragments   CBC with Differential  Result Value Ref Range   WBC 5.8 3.9 -  10.3 10e3/uL   NEUT# 4.3 1.5 - 6.5 10e3/uL   HGB 10.7 (L) 11.6 - 15.9 g/dL   HCT 34.5 (L) 34.8 - 46.6 %   Platelets 116 (L) 145 - 400 10e3/uL   MCV 99.7 79.5 -  101.0 fL   MCH 30.9 25.1 - 34.0 pg   MCHC 31.0 (L) 31.5 - 36.0 g/dL   RBC 3.46 (L) 3.70 - 5.45 10e6/uL   RDW 16.6 (H) 11.2 - 14.5 %   lymph# 0.6 (L) 0.9 - 3.3 10e3/uL   MONO# 0.6 0.1 - 0.9 10e3/uL   Eosinophils Absolute 0.2 0.0 - 0.5 10e3/uL   Basophils Absolute 0.0 0.0 - 0.1 10e3/uL   NEUT% 74.5 38.4 - 76.8 %   LYMPH% 10.5 (L) 14.0 - 49.7 %   MONO% 10.7 0.0 - 14.0 %   EOS% 4.0 0.0 - 7.0 %   BASO% 0.3 0.0 - 2.0 %   nRBC 0 0 - 0 %  Protime-INR  Result Value Ref Range   Protime 25.2 (H) 10.6 - 13.4 Seconds   INR 2.10 2.00 - 3.50   Lovenox No      Studies/Results:  Mammograms at [facility] 07-10-15 with scattered densities and no mammographic findings of concern.   Medications: I have reviewed the patient's current medications. Continue coumadin 5 mg TTSS and 7.5 mg MWF. Increase diuretics as she has been instructed by other physicians  DISCUSSION Patient prefers to continue monthly coumadin checks at this office for now.  Assessment/Plan: 1.History of several episodes of PEs, IVC filter in, history of retinal bleed possibly while on coumadin, then symptomatic bilateral LE DVTs 2013 such that she resumed coumadin. OK to hold coumadin if procedures require. Coumadin dosing as above, INR montly. 2.Anemia: multifactorial and stable to slightly improved today. CKD tho erythropoietin 69 in 03-2015, B12 normal, serum iron 57 with %sat 19.  3.intermittent mild thrombocytopenia since at least 2011, HIT negative. Platelet count varies, a little lower today. Will follow with CBCs. Would check spleen size with [place] if consistently lower, as some question of lobular changes in liver on CT 2014.  4.morbid obesity, BMI 52 5.cardiac disease with HTN, LBBB and hx sinus bradycardia 6.diabetes on insulin, followed by Dr Abbott Pao 7.pulmonary HTN  8.history of gout, on allopurinol 9.environmental allergies discussed as above   All questions answered and patient is in agreement with plans. Time  spent 15 min including >50% counseling and coordination of care. Route to PCP   LIVESAY,LENNIS P, MD   07/19/2015, 1:32 PM

## 2015-08-16 ENCOUNTER — Other Ambulatory Visit (HOSPITAL_BASED_OUTPATIENT_CLINIC_OR_DEPARTMENT_OTHER): Payer: Medicare HMO

## 2015-08-16 ENCOUNTER — Telehealth: Payer: Self-pay

## 2015-08-16 DIAGNOSIS — Z86711 Personal history of pulmonary embolism: Secondary | ICD-10-CM | POA: Diagnosis not present

## 2015-08-16 LAB — PROTIME-INR
INR: 1.9 — ABNORMAL LOW (ref 2.00–3.50)
PROTIME: 22.8 s — AB (ref 10.6–13.4)

## 2015-08-16 NOTE — Telephone Encounter (Signed)
LM for [name] stating that her INR was 1.9.   Dr. Marko Plume wants her to take 7.5 mg tonight 08-16-15  then back on the usual schedule of 5 mg TTSS and 7.5 mg MWF.  Keep appointment for 09-13-15  lab for PT/INR. She can call back  To Dr. Mariana Kaufman nurse if she has any questions or concerns.

## 2015-09-07 ENCOUNTER — Other Ambulatory Visit: Payer: Self-pay | Admitting: Oncology

## 2015-09-07 DIAGNOSIS — Z7901 Long term (current) use of anticoagulants: Secondary | ICD-10-CM

## 2015-09-12 ENCOUNTER — Telehealth: Payer: Self-pay

## 2015-09-12 ENCOUNTER — Emergency Department (HOSPITAL_COMMUNITY): Payer: Medicare HMO

## 2015-09-12 ENCOUNTER — Inpatient Hospital Stay (HOSPITAL_COMMUNITY)
Admission: EM | Admit: 2015-09-12 | Discharge: 2015-09-18 | DRG: 291 | Disposition: A | Discharge status: Home / Self Care | Payer: Medicare HMO | Attending: Internal Medicine | Admitting: Internal Medicine

## 2015-09-12 ENCOUNTER — Encounter (HOSPITAL_COMMUNITY): Payer: Self-pay | Admitting: Emergency Medicine

## 2015-09-12 DIAGNOSIS — I272 Other secondary pulmonary hypertension: Secondary | ICD-10-CM | POA: Diagnosis present

## 2015-09-12 DIAGNOSIS — J441 Chronic obstructive pulmonary disease with (acute) exacerbation: Secondary | ICD-10-CM | POA: Diagnosis present

## 2015-09-12 DIAGNOSIS — N183 Chronic kidney disease, stage 3 unspecified: Secondary | ICD-10-CM | POA: Diagnosis present

## 2015-09-12 DIAGNOSIS — Z95828 Presence of other vascular implants and grafts: Secondary | ICD-10-CM

## 2015-09-12 DIAGNOSIS — Z86711 Personal history of pulmonary embolism: Secondary | ICD-10-CM | POA: Diagnosis present

## 2015-09-12 DIAGNOSIS — I5189 Other ill-defined heart diseases: Secondary | ICD-10-CM | POA: Diagnosis present

## 2015-09-12 DIAGNOSIS — T801XXA Vascular complications following infusion, transfusion and therapeutic injection, initial encounter: Secondary | ICD-10-CM | POA: Diagnosis not present

## 2015-09-12 DIAGNOSIS — N1832 Chronic kidney disease, stage 3b: Secondary | ICD-10-CM | POA: Diagnosis present

## 2015-09-12 DIAGNOSIS — R0600 Dyspnea, unspecified: Secondary | ICD-10-CM

## 2015-09-12 DIAGNOSIS — I10 Essential (primary) hypertension: Secondary | ICD-10-CM | POA: Diagnosis present

## 2015-09-12 DIAGNOSIS — Z7982 Long term (current) use of aspirin: Secondary | ICD-10-CM

## 2015-09-12 DIAGNOSIS — I5031 Acute diastolic (congestive) heart failure: Secondary | ICD-10-CM | POA: Diagnosis not present

## 2015-09-12 DIAGNOSIS — G4733 Obstructive sleep apnea (adult) (pediatric): Secondary | ICD-10-CM | POA: Diagnosis present

## 2015-09-12 DIAGNOSIS — E7800 Pure hypercholesterolemia, unspecified: Secondary | ICD-10-CM | POA: Diagnosis present

## 2015-09-12 DIAGNOSIS — Z87891 Personal history of nicotine dependence: Secondary | ICD-10-CM

## 2015-09-12 DIAGNOSIS — E785 Hyperlipidemia, unspecified: Secondary | ICD-10-CM | POA: Diagnosis present

## 2015-09-12 DIAGNOSIS — Z794 Long term (current) use of insulin: Secondary | ICD-10-CM

## 2015-09-12 DIAGNOSIS — K219 Gastro-esophageal reflux disease without esophagitis: Secondary | ICD-10-CM | POA: Diagnosis present

## 2015-09-12 DIAGNOSIS — D638 Anemia in other chronic diseases classified elsewhere: Secondary | ICD-10-CM | POA: Diagnosis present

## 2015-09-12 DIAGNOSIS — Z8249 Family history of ischemic heart disease and other diseases of the circulatory system: Secondary | ICD-10-CM

## 2015-09-12 DIAGNOSIS — E039 Hypothyroidism, unspecified: Secondary | ICD-10-CM | POA: Diagnosis present

## 2015-09-12 DIAGNOSIS — I82403 Acute embolism and thrombosis of unspecified deep veins of lower extremity, bilateral: Secondary | ICD-10-CM | POA: Diagnosis present

## 2015-09-12 DIAGNOSIS — N184 Chronic kidney disease, stage 4 (severe): Secondary | ICD-10-CM | POA: Diagnosis present

## 2015-09-12 DIAGNOSIS — I509 Heart failure, unspecified: Secondary | ICD-10-CM | POA: Diagnosis not present

## 2015-09-12 DIAGNOSIS — E119 Type 2 diabetes mellitus without complications: Secondary | ICD-10-CM

## 2015-09-12 DIAGNOSIS — F329 Major depressive disorder, single episode, unspecified: Secondary | ICD-10-CM | POA: Diagnosis present

## 2015-09-12 DIAGNOSIS — Z885 Allergy status to narcotic agent status: Secondary | ICD-10-CM

## 2015-09-12 DIAGNOSIS — I252 Old myocardial infarction: Secondary | ICD-10-CM

## 2015-09-12 DIAGNOSIS — I4892 Unspecified atrial flutter: Secondary | ICD-10-CM | POA: Diagnosis present

## 2015-09-12 DIAGNOSIS — I4891 Unspecified atrial fibrillation: Secondary | ICD-10-CM | POA: Diagnosis present

## 2015-09-12 DIAGNOSIS — I809 Phlebitis and thrombophlebitis of unspecified site: Secondary | ICD-10-CM | POA: Diagnosis not present

## 2015-09-12 DIAGNOSIS — Z79899 Other long term (current) drug therapy: Secondary | ICD-10-CM

## 2015-09-12 DIAGNOSIS — I2510 Atherosclerotic heart disease of native coronary artery without angina pectoris: Secondary | ICD-10-CM | POA: Diagnosis present

## 2015-09-12 DIAGNOSIS — Z9981 Dependence on supplemental oxygen: Secondary | ICD-10-CM

## 2015-09-12 DIAGNOSIS — D696 Thrombocytopenia, unspecified: Secondary | ICD-10-CM | POA: Diagnosis present

## 2015-09-12 DIAGNOSIS — I130 Hypertensive heart and chronic kidney disease with heart failure and stage 1 through stage 4 chronic kidney disease, or unspecified chronic kidney disease: Principal | ICD-10-CM | POA: Diagnosis present

## 2015-09-12 DIAGNOSIS — J449 Chronic obstructive pulmonary disease, unspecified: Secondary | ICD-10-CM

## 2015-09-12 DIAGNOSIS — E876 Hypokalemia: Secondary | ICD-10-CM | POA: Diagnosis present

## 2015-09-12 DIAGNOSIS — Z6841 Body Mass Index (BMI) 40.0 and over, adult: Secondary | ICD-10-CM

## 2015-09-12 DIAGNOSIS — Z86718 Personal history of other venous thrombosis and embolism: Secondary | ICD-10-CM

## 2015-09-12 DIAGNOSIS — I5033 Acute on chronic diastolic (congestive) heart failure: Secondary | ICD-10-CM | POA: Diagnosis present

## 2015-09-12 DIAGNOSIS — E1122 Type 2 diabetes mellitus with diabetic chronic kidney disease: Secondary | ICD-10-CM | POA: Diagnosis present

## 2015-09-12 DIAGNOSIS — IMO0001 Reserved for inherently not codable concepts without codable children: Secondary | ICD-10-CM

## 2015-09-12 DIAGNOSIS — Z809 Family history of malignant neoplasm, unspecified: Secondary | ICD-10-CM

## 2015-09-12 DIAGNOSIS — J9601 Acute respiratory failure with hypoxia: Secondary | ICD-10-CM | POA: Diagnosis present

## 2015-09-12 DIAGNOSIS — I82409 Acute embolism and thrombosis of unspecified deep veins of unspecified lower extremity: Secondary | ICD-10-CM | POA: Diagnosis present

## 2015-09-12 DIAGNOSIS — Z7901 Long term (current) use of anticoagulants: Secondary | ICD-10-CM

## 2015-09-12 DIAGNOSIS — I739 Peripheral vascular disease, unspecified: Secondary | ICD-10-CM | POA: Diagnosis present

## 2015-09-12 DIAGNOSIS — R2689 Other abnormalities of gait and mobility: Secondary | ICD-10-CM | POA: Diagnosis present

## 2015-09-12 DIAGNOSIS — F419 Anxiety disorder, unspecified: Secondary | ICD-10-CM | POA: Diagnosis present

## 2015-09-12 LAB — BASIC METABOLIC PANEL
Anion gap: 11 (ref 5–15)
BUN: 26 mg/dL — AB (ref 6–20)
CALCIUM: 9.1 mg/dL (ref 8.9–10.3)
CHLORIDE: 100 mmol/L — AB (ref 101–111)
CO2: 27 mmol/L (ref 22–32)
CREATININE: 1.18 mg/dL — AB (ref 0.44–1.00)
GFR calc non Af Amer: 49 mL/min — ABNORMAL LOW (ref >60)
GFR, EST AFRICAN AMERICAN: 57 mL/min — AB (ref >60)
Glucose, Bld: 124 mg/dL — ABNORMAL HIGH (ref 65–99)
Potassium: 4.1 mmol/L (ref 3.5–5.1)
SODIUM: 138 mmol/L (ref 135–145)

## 2015-09-12 LAB — CBG MONITORING, ED
GLUCOSE-CAPILLARY: 233 mg/dL — AB (ref 65–99)
GLUCOSE-CAPILLARY: 339 mg/dL — AB (ref 65–99)

## 2015-09-12 LAB — I-STAT TROPONIN, ED: TROPONIN I, POC: 0.06 ng/mL (ref 0.00–0.08)

## 2015-09-12 LAB — CBC
HCT: 33.1 % — ABNORMAL LOW (ref 36.0–46.0)
Hemoglobin: 10.1 g/dL — ABNORMAL LOW (ref 12.0–15.0)
MCH: 30 pg (ref 26.0–34.0)
MCHC: 30.5 g/dL (ref 30.0–36.0)
MCV: 98.2 fL (ref 78.0–100.0)
PLATELETS: 128 10*3/uL — AB (ref 150–400)
RBC: 3.37 MIL/uL — AB (ref 3.87–5.11)
RDW: 16.3 % — AB (ref 11.5–15.5)
WBC: 7.1 10*3/uL (ref 4.0–10.5)

## 2015-09-12 LAB — GLUCOSE, CAPILLARY
Glucose-Capillary: 245 mg/dL — ABNORMAL HIGH (ref 65–99)
Glucose-Capillary: 286 mg/dL — ABNORMAL HIGH (ref 65–99)

## 2015-09-12 LAB — PROTIME-INR
INR: 2.46 — ABNORMAL HIGH (ref 0.00–1.49)
PROTHROMBIN TIME: 26.3 s — AB (ref 11.6–15.2)

## 2015-09-12 LAB — BRAIN NATRIURETIC PEPTIDE: B Natriuretic Peptide: 578.2 pg/mL — ABNORMAL HIGH (ref 0.0–100.0)

## 2015-09-12 MED ORDER — LEVOFLOXACIN 500 MG PO TABS: 500.0000 mg | ORAL_TABLET | Freq: Every day | ORAL | Status: DC

## 2015-09-12 MED ORDER — ONDANSETRON HCL 4 MG/2ML IJ SOLN: 4.0000 mg | Freq: Four times a day (QID) | INTRAMUSCULAR | Status: DC | PRN

## 2015-09-12 MED ORDER — METOPROLOL TARTRATE 25 MG PO TABS
25.0000 mg | ORAL_TABLET | Freq: Two times a day (BID) | ORAL | Status: DC
  Administered 2015-09-12 – 2015-09-13 (×3): 25 mg via ORAL
  Filled 2015-09-12 (×3): qty 1

## 2015-09-12 MED ORDER — LEVOFLOXACIN IN D5W 500 MG/100ML IV SOLN
500.0000 mg | Freq: Once | INTRAVENOUS | Status: AC
  Administered 2015-09-12: 500 mg via INTRAVENOUS
  Filled 2015-09-12: qty 100

## 2015-09-12 MED ORDER — ALLOPURINOL 300 MG PO TABS
300.0000 mg | ORAL_TABLET | Freq: Every day | ORAL | Status: DC
  Administered 2015-09-12 – 2015-09-18 (×7): 300 mg via ORAL
  Filled 2015-09-12 (×2): qty 1
  Filled 2015-09-12: qty 3
  Filled 2015-09-12 (×4): qty 1

## 2015-09-12 MED ORDER — FUROSEMIDE 10 MG/ML IJ SOLN
40.0000 mg | Freq: Two times a day (BID) | INTRAMUSCULAR | Status: DC
  Administered 2015-09-12 – 2015-09-14 (×5): 40 mg via INTRAVENOUS
  Filled 2015-09-12 (×6): qty 4

## 2015-09-12 MED ORDER — SODIUM CHLORIDE 0.9 % IV SOLN: 250.0000 mL | INTRAVENOUS | Status: DC | PRN

## 2015-09-12 MED ORDER — LEVOTHYROXINE SODIUM 50 MCG PO TABS
50.0000 ug | ORAL_TABLET | Freq: Every day | ORAL | Status: DC
  Administered 2015-09-12 – 2015-09-18 (×7): 50 ug via ORAL
  Filled 2015-09-12 (×7): qty 1

## 2015-09-12 MED ORDER — SODIUM CHLORIDE 0.9% FLUSH: 3.0000 mL | INTRAVENOUS | Status: DC | PRN

## 2015-09-12 MED ORDER — DOXYCYCLINE HYCLATE 100 MG PO TABS
100.0000 mg | ORAL_TABLET | Freq: Two times a day (BID) | ORAL | Status: DC
  Administered 2015-09-12 – 2015-09-18 (×12): 100 mg via ORAL
  Filled 2015-09-12 (×13): qty 1

## 2015-09-12 MED ORDER — ASPIRIN EC 81 MG PO TBEC
81.0000 mg | DELAYED_RELEASE_TABLET | Freq: Every day | ORAL | Status: DC
  Administered 2015-09-12 – 2015-09-14 (×3): 81 mg via ORAL
  Filled 2015-09-12 (×3): qty 1

## 2015-09-12 MED ORDER — WARFARIN SODIUM 2.5 MG PO TABS: 2.5000 mg | ORAL_TABLET | Freq: Every day | ORAL | Status: DC

## 2015-09-12 MED ORDER — WARFARIN - PHYSICIAN DOSING INPATIENT
Freq: Every day | Status: DC
  Administered 2015-09-12 – 2015-09-13 (×2)

## 2015-09-12 MED ORDER — FUROSEMIDE 10 MG/ML IJ SOLN
40.0000 mg | Freq: Once | INTRAMUSCULAR | Status: AC
  Administered 2015-09-12: 40 mg via INTRAVENOUS
  Filled 2015-09-12: qty 4

## 2015-09-12 MED ORDER — IRBESARTAN 300 MG PO TABS
300.0000 mg | ORAL_TABLET | Freq: Every day | ORAL | Status: DC
  Administered 2015-09-12 – 2015-09-14 (×3): 300 mg via ORAL
  Filled 2015-09-12 (×3): qty 1

## 2015-09-12 MED ORDER — IPRATROPIUM-ALBUTEROL 0.5-2.5 (3) MG/3ML IN SOLN
3.0000 mL | Freq: Four times a day (QID) | RESPIRATORY_TRACT | Status: DC | PRN
  Administered 2015-09-16: 3 mL via RESPIRATORY_TRACT
  Filled 2015-09-12: qty 3

## 2015-09-12 MED ORDER — CITALOPRAM HYDROBROMIDE 20 MG PO TABS
20.0000 mg | ORAL_TABLET | Freq: Every day | ORAL | Status: DC
  Administered 2015-09-12 – 2015-09-17 (×6): 20 mg via ORAL
  Filled 2015-09-12 (×6): qty 1

## 2015-09-12 MED ORDER — WARFARIN SODIUM 5 MG PO TABS
5.0000 mg | ORAL_TABLET | ORAL | Status: DC
  Administered 2015-09-13: 5 mg via ORAL
  Filled 2015-09-12: qty 1

## 2015-09-12 MED ORDER — INSULIN ASPART 100 UNIT/ML ~~LOC~~ SOLN
3.0000 [IU] | Freq: Once | SUBCUTANEOUS | Status: AC
  Administered 2015-09-12: 3 [IU] via SUBCUTANEOUS

## 2015-09-12 MED ORDER — SODIUM CHLORIDE 0.9% FLUSH
3.0000 mL | Freq: Two times a day (BID) | INTRAVENOUS | Status: DC
  Administered 2015-09-12 – 2015-09-18 (×12): 3 mL via INTRAVENOUS

## 2015-09-12 MED ORDER — INSULIN GLARGINE 100 UNITS/ML SOLOSTAR PEN: 12.0000 [IU] | PEN_INJECTOR | SUBCUTANEOUS | Status: DC

## 2015-09-12 MED ORDER — ZOLPIDEM TARTRATE 5 MG PO TABS
5.0000 mg | ORAL_TABLET | Freq: Once | ORAL | Status: AC
  Administered 2015-09-12: 5 mg via ORAL
  Filled 2015-09-12: qty 1

## 2015-09-12 MED ORDER — INSULIN GLARGINE 100 UNIT/ML ~~LOC~~ SOLN
24.0000 [IU] | Freq: Every day | SUBCUTANEOUS | Status: DC
  Administered 2015-09-12 – 2015-09-18 (×7): 24 [IU] via SUBCUTANEOUS
  Filled 2015-09-12 (×8): qty 0.24

## 2015-09-12 MED ORDER — INSULIN ASPART 100 UNIT/ML ~~LOC~~ SOLN
0.0000 [IU] | Freq: Three times a day (TID) | SUBCUTANEOUS | Status: DC
  Administered 2015-09-12: 3 [IU] via SUBCUTANEOUS
  Administered 2015-09-12: 7 [IU] via SUBCUTANEOUS
  Administered 2015-09-13 (×2): 2 [IU] via SUBCUTANEOUS
  Administered 2015-09-13: 1 [IU] via SUBCUTANEOUS
  Administered 2015-09-14: 2 [IU] via SUBCUTANEOUS
  Administered 2015-09-15 – 2015-09-16 (×5): 1 [IU] via SUBCUTANEOUS
  Administered 2015-09-17: 2 [IU] via SUBCUTANEOUS
  Administered 2015-09-17: 1 [IU] via SUBCUTANEOUS
  Administered 2015-09-17: 2 [IU] via SUBCUTANEOUS
  Administered 2015-09-18: 1 [IU] via SUBCUTANEOUS
  Administered 2015-09-18: 2 [IU] via SUBCUTANEOUS
  Filled 2015-09-12: qty 1

## 2015-09-12 MED ORDER — WARFARIN SODIUM 7.5 MG PO TABS
7.5000 mg | ORAL_TABLET | ORAL | Status: DC
  Administered 2015-09-12: 7.5 mg via ORAL
  Filled 2015-09-12: qty 1

## 2015-09-12 MED ORDER — NITROGLYCERIN 2 % TD OINT
1.0000 [in_us] | TOPICAL_OINTMENT | Freq: Once | TRANSDERMAL | Status: AC
  Administered 2015-09-12: 1 [in_us] via TOPICAL
  Filled 2015-09-12: qty 1

## 2015-09-12 MED ORDER — ISOSORBIDE MONONITRATE ER 30 MG PO TB24
30.0000 mg | ORAL_TABLET | Freq: Every day | ORAL | Status: DC
  Administered 2015-09-13 – 2015-09-14 (×2): 30 mg via ORAL
  Filled 2015-09-12 (×2): qty 1

## 2015-09-12 MED ORDER — INSULIN GLARGINE 100 UNITS/ML SOLOSTAR PEN: 24.0000 [IU] | PEN_INJECTOR | SUBCUTANEOUS | Status: DC

## 2015-09-12 MED ORDER — ACETAMINOPHEN 325 MG PO TABS
650.0000 mg | ORAL_TABLET | ORAL | Status: DC | PRN
  Administered 2015-09-14 – 2015-09-17 (×5): 650 mg via ORAL
  Filled 2015-09-12 (×5): qty 2

## 2015-09-12 NOTE — ED Notes (Signed)
Ordered breakfast tray for patient

## 2015-09-12 NOTE — ED Notes (Signed)
Pt brought by EMS from home for SOB and CP, pt 91% on RA at home with expiratory wz, placed on 3L [place] up to 96% and 100% on neb treatment, pt c/o 3/10 left chest pressure, 5 mg Albuterol, 125 mg Solumedrol, 4 mg Zofran and 324 mg ASA given by EMS. Pt AO x4 NAD noticed.

## 2015-09-12 NOTE — ED Notes (Signed)
Pt ambulated to restroom from room.

## 2015-09-12 NOTE — Progress Notes (Signed)
New pt admission from ED. Pt brought to the floor in stable condition. Vitals taken. Initial Assessment done. All immediate pertinent needs to patient addressed. Patient Guide given to patient. Important safety instructions relating to hospitalization reviewed with patient. Patient verbalized understanding. Will continue to monitor pt.  Leah Kertesz RN 

## 2015-09-12 NOTE — ED Notes (Signed)
Pt CBG, 233.

## 2015-09-12 NOTE — Telephone Encounter (Signed)
[name] gave a courtesy call to let Dr Edwyna Shell know pt is in hospital for acute exacerbation of CHF and exacerbation of COPD. No consult is necessary at this time. Pt is in ED and will be admitted to telemetry at [facility].

## 2015-09-12 NOTE — Progress Notes (Signed)
Carryover Pt pmh of CHF, MI, a-fib, PE on coumadin, high cholesterol, and DM; who presents acutely overloaded with complaints of shortness of breath and chest pain for the last 2 days.CXR showing CHF pattern. BNP 578. Given 80 mg of Lasix IV in the ED. INR therapeutic low suspicion for clots. Admitted to a telemetry bed.

## 2015-09-12 NOTE — ED Notes (Signed)
Attempted report 

## 2015-09-12 NOTE — ED Notes (Signed)
Pt reports itching noted to IV site. Redness noted surrounding the area. Medication stopped.

## 2015-09-12 NOTE — ED Notes (Signed)
Pt ambulated to bathroom after lasix on RA, desated to 75%. Placed on [place] upon return to room, sats up to 100% on 3L. Dr. Dina Rich notified.

## 2015-09-12 NOTE — ED Provider Notes (Signed)
CSN: [id number]     Arrival date & time 09/12/15  0254 History  By signing my name below, I, Altamease Oiler, attest that this documentation has been prepared under the direction and in the presence of Merryl Hacker, MD. Electronically Signed: Altamease Oiler, ED Scribe. 09/12/2015. 3:57 AM   Chief Complaint  Patient presents with  . Shortness of Breath  . Chest Pain   The history is provided by the patient. No language interpreter was used.   Brought in by EMS from home, [name] is a 60 y.o. female with PMHx including CHF, MI, a-fib, PE on coumadin, high cholesterol, and DM  who presents to the Emergency Department complaining of worsening SOB with onset 2 days ago. She is not on home oxygen. Pt states that she can hear gurgling when she lays flat and has had similar symptoms in the past with CHF. A breathing treatment by EMS provided some improvement in her breathing.  Associated symptoms include chills, dry cough, headache, chest pressure, back pain, and LE swelling.  She also complains of neck pain.  Pt denies fever. She did take aspirin at home and was given an additional 324 mg by EMS. Her dry weight is 269. No recent medication change.   Past Medical History  Diagnosis Date  . Hypertension   . Depression   . DVT (deep venous thrombosis) ([place]) 10 years ago    numerous/notes 01/18/2013  . PE (pulmonary thromboembolism) ([place]) 3 years ag0    3/notes 01/18/2013  . A-fib ([place])   . Bleeding on Coumadin ([place]) 08/2012; 01/18/2013    [facility] admissions (01/19/2013)  . High cholesterol     "been off RX for this at one time" (01/18/2013)  . Heart murmur   . CHF (congestive heart failure) ([place])     "2-3 times" (01/19/2013)  . Pneumonia before 2011    "once' (01/18/2013)  . Shortness of breath     "only related to my CHF" (01/18/2013)  . Hypothyroidism   . Type II diabetes mellitus ([place])   . OSA (obstructive sleep apnea)     "sent me for test in 04/2012; never ordered mask, etc"  (01/19/2013)  . Anemia   . History of blood transfusion 1983; 04/2012    "3 w/ childbirth; hospitalized for pain" (01/19/2013)  . GERD (gastroesophageal reflux disease)   . [id number])     "maybe weekly" (01/19/2013)  . Migraines     "twice/yr maybe" (01/19/2013)  . Arthritis     "right hip; both knees; left wrist/shoulder; back" (01/19/2013"  . Chronic lower back pain   . Gout   . Anxiety   . Renal disorder     kindey function low; "Metformin was destroying my kidneys" (01/19/2013)  . UTI (urinary tract infection) 08/31/2014  . Swelling of hand 08/31/2014    RT HAND   Past Surgical History  Procedure Laterality Date  . Cesarean section  1983  . Cholecystectomy  ~ 2002  . Vena cava filter placement  2011?  Marland Kitchen Eye surgery Bilateral     "multiple" (01/18/2013)  . Cataract extraction w/ intraocular lens  implant, bilateral Bilateral 2006-2011  . Pars plana vitrectomy Bilateral 2004-2006    "several" (01/18/2013)  . Pars plana repair of retinal deatachment Right   . Refractive surgery Bilateral     "for stigmatism" (01/18/2013)  . Refractive surgery Left ~ 11/2012    "to puff it up cause vision got hazy" (01/18/2013)  . Colonoscopy N/A 01/21/2013  Procedure: COLONOSCOPY;  Surgeon: Beryle Beams, MD;  Location: [place];  Service: Endoscopy;  Laterality: N/A;  . Cardiac catheterization N/A 03/13/2015    Procedure: Right/Left Heart Cath and Coronary Angiography;  Surgeon: Adrian Prows, MD;  Location: [place] [facility];  Service: Cardiovascular;  Laterality: N/A;   Family History  Problem Relation Age of Onset  . Cerebral aneurysm Mother   . Hypertension Father   . Cerebral aneurysm Maternal Grandfather   . Cerebral aneurysm Maternal Aunt   . Cancer Maternal Uncle    Social History  Substance Use Topics  . Smoking status: Former Smoker -- 0.05 packs/day for 30 years    Types: Cigarettes  . Smokeless tobacco: Never Used     Comment: 01/19/2013 "quit smoking cigarettes  in the early '90's"  . Alcohol Use: Yes     Comment: 01/19/2013 "drank years ago; last drink 03/2012"   OB History    No data available     Review of Systems  Constitutional: Positive for chills. Negative for fever.  Respiratory: Positive for cough and shortness of breath.   Cardiovascular: Positive for chest pain and leg swelling.  Musculoskeletal: Positive for neck pain.  Neurological: Positive for headaches.  All other systems reviewed and are negative.  Allergies  Morphine and related  Home Medications   Prior to Admission medications   Medication Sig Start Date End Date Taking? Authorizing Provider  allopurinol (ZYLOPRIM) 300 MG tablet Take 300 mg by mouth daily.    Historical Provider, MD  aspirin EC 81 MG tablet Take 81 mg by mouth daily.    Historical Provider, MD  Biotin 2500 MCG CAPS Take 1 capsule by mouth 2 (two) times daily.    Historical Provider, MD  calcium-vitamin D 250-100 MG-UNIT per tablet Take 2 tablets by mouth 2 (two) times daily.    Historical Provider, MD  cholecalciferol (VITAMIN D) 1000 UNITS tablet Take 2,000 Units by mouth daily.    Lauraine Rinne, MD  citalopram (CELEXA) 20 MG tablet Take 20 mg by mouth at bedtime.    Historical Provider, MD  diazepam (VALIUM) 5 MG tablet Take 1 tablet (5 mg total) by mouth at bedtime as needed for anxiety or sleep (muscle spasms). For muscle spasms. 05/11/12   Ripudeep Krystal Eaton, MD  ferrous gluconate (FERGON) 324 MG tablet Take 1 tablet (324 mg total) by mouth 2 (two) times daily with a meal. 05/11/12   Ripudeep Krystal Eaton, MD  furosemide (LASIX) 40 MG tablet Take 40 mg by mouth daily.    Historical Provider, MD  HYDROcodone-acetaminophen (NORCO/VICODIN) 5-325 MG per tablet Take 1-2 tablets by mouth every 6 (six) hours as needed for severe pain. 09/04/14   Velvet Bathe, MD  insulin aspart (NOVOLOG) 100 UNIT/ML injection Inject 2 Units into the skin 3 (three) times daily with meals. Patient taking differently: Inject 10 Units  into the skin 3 (three) times daily with meals.  09/04/14   Velvet Bathe, MD  insulin glargine (LANTUS) 100 unit/mL SOPN Inject 24-26 Units into the skin every morning.     Historical Provider, MD  isosorbide mononitrate (IMDUR) 30 MG 24 hr tablet Take 1 tablet (30 mg total) by mouth daily. 05/11/12   Ripudeep Krystal Eaton, MD  levothyroxine (SYNTHROID, LEVOTHROID) 50 MCG tablet Take 50 mcg by mouth daily at 12 noon.    Historical Provider, MD  MELATONIN PO Take 0.25 mg by mouth at bedtime.    Historical Provider, MD  metoprolol (LOPRESSOR) 25 MG  tablet Take 1 tablet (25 mg total) by mouth 2 (two) times daily. 09/04/14   Velvet Bathe, MD  Multiple Vitamin (MULITIVITAMIN WITH MINERALS) TABS Take 1 tablet by mouth daily.    Historical Provider, MD  Omega-3 Fatty Acids (FISH OIL) 1200 MG CAPS Take 1 capsule by mouth 2 (two) times daily.    Historical Provider, MD  warfarin (COUMADIN) 2.5 MG tablet TAKE 7.5 MG (THREE TABLETS) BY MOUTH DAILY EXCEPT TAKE 5 MG (TWO TABLETS) ON MONDAY, WEDNESDAY, FRIDAY OR AS DIRECTED 09/07/15   Lennis P Livesay, MD   BP 154/54 mmHg  Pulse 65  Temp(Src) 99.8 F (37.7 C) (Oral)  Resp 24  Ht 5\' 3"  (1.6 m)  Wt 269 lb (122.018 kg)  BMI 47.66 kg/m2  SpO2 99% Physical Exam  Constitutional: She is oriented to person, place, and time. She appears well-developed and well-nourished. No distress.  Morbidly obese  HENT:  Head: Normocephalic and atraumatic.  Cardiovascular: Normal rate, regular rhythm and normal heart sounds.   Pulmonary/Chest: Effort normal. No respiratory distress. She has wheezes.  Scant expiratory wheeze  Abdominal: Soft. Bowel sounds are normal. There is no tenderness. There is no rebound.  Musculoskeletal: She exhibits edema.  1+ pitting lower extremity edema, chronic venous stasis changes with darkening of the bilateral lower extremities, weeping noted right lower extremity  Neurological: She is alert and oriented to person, place, and time.  Skin: Skin is warm  and dry.  Psychiatric: She has a normal mood and affect.  Nursing note and vitals reviewed.   ED Course  Procedures (including critical care time) DIAGNOSTIC STUDIES: Oxygen Saturation is 99% on RA,  normal by my interpretation.    COORDINATION OF CARE: 3:41 AM Discussed treatment plan which includes lab work, CXR, EKG, and Lasix with pt at bedside and pt agreed to plan.  Labs Review Labs Reviewed  BASIC METABOLIC PANEL - Abnormal; Notable for the following:    Chloride 100 (*)    Glucose, Bld 124 (*)    BUN 26 (*)    Creatinine, Ser 1.18 (*)    GFR calc non Af Amer 49 (*)    GFR calc Af Amer 57 (*)    All other components within normal limits  CBC - Abnormal; Notable for the following:    RBC 3.37 (*)    Hemoglobin 10.1 (*)    HCT 33.1 (*)    RDW 16.3 (*)    Platelets 128 (*)    All other components within normal limits  PROTIME-INR - Abnormal; Notable for the following:    Prothrombin Time 26.3 (*)    INR 2.46 (*)    All other components within normal limits  BRAIN NATRIURETIC PEPTIDE - Abnormal; Notable for the following:    B Natriuretic Peptide 578.2 (*)    All other components within normal limits  I-STAT TROPOININ, ED    Imaging Review Dg Chest 2 View  09/12/2015  CLINICAL DATA:  Mid chest pain.  Shortness of breath. EXAM: CHEST  2 VIEW COMPARISON:  01/18/2015 FINDINGS: Cardiomegaly with notable left atrial enlargement. Mitral annular ossification. Stable aortic and hilar contours. Interstitial coarsening with congestive vessels and mild central cuffing. No effusion or air leak. IVC filter. IMPRESSION: CHF pattern. Electronically Signed   By: Monte Fantasia M.D.   On: 09/12/2015 03:34   I have personally reviewed and evaluated these images and lab results as part of my medical decision-making.   EKG Interpretation   Date/Time:  Wednesday September 12 2015 03:03:28 EDT Ventricular Rate:  69 PR Interval:  254 QRS Duration: 174 QT Interval:  473 QTC Calculation:  507 R Axis:   -114 Text Interpretation:  Sinus rhythm Prolonged PR interval Nonspecific IVCD  with LAD Similar to prior Confirmed by HORTON  MD, COURTNEY ([id number]) on  09/12/2015 3:16:52 AM      Medications  furosemide (LASIX) injection 40 mg (not administered)  furosemide (LASIX) injection 40 mg (40 mg Intravenous Given 09/12/15 0420)  nitroGLYCERIN (NITROGLYN) 2 % ointment 1 inch (1 inch Topical Given 09/12/15 0459)    MDM   Final diagnoses:  Acute on chronic congestive heart failure, unspecified congestive heart failure type [facility])    Patient presents with shortness of breath and anterior chest pain. History of heart failure. No history of COPD. Did receive a DuoNeb by EMS.  She is nontoxic on exam. She is requiring nasal cannula to maintain O2 sats. She does appear somewhat volume overloaded but is in no respiratory distress. Chest x-ray shows evidence of congestive changes. BNP is 578. INR is therapeutic. EKG is nonischemic and troponin is negative. Patient was given IV Lasix. Chest pain improved with nitroglycerin. She had a cardiac catheterization in December. Suspect COPD exacerbation. Patient desat into 75% with ambulation. Given hypoxia, will admit for diuresis and serial troponins.  After history, exam, and medical workup I feel the patient has been appropriately medically screened and is safe for discharge home. Pertinent diagnoses were discussed with the patient. Patient was given return precautions.  I personally performed the services described in this documentation, which was scribed in my presence. The recorded information has been reviewed and is accurate.    Merryl Hacker, MD 09/12/15 [phone]

## 2015-09-12 NOTE — Progress Notes (Signed)
Nursing staff, Jerene Pitch, noticed phlebitis w/ infusion of Levaquin w/ pt complaining of itching. Will DC levaquin and start Doxy 100mg  po BID.   Linna Darner, MD [facility] 09/12/2015, 2:09 PM

## 2015-09-12 NOTE — Progress Notes (Addendum)
Pharmacy Antibiotic Note  [name] is a 60 y.o. female admitted on 09/12/2015 with SOB and wheezing. Will start Levaquin for COPD exacerbation. SCr 1.1, eCrCl ~ 60 ml/min.   Plan: Levaquin 500 mg IV x1 then 500 mg PO daily Recommend 5 day course Monitor renal fx and duration of therapy Pharmacy will sign off, please re-consult as needed  Height: 5\' 3"  (160 cm) Weight: 269 lb (122.018 kg) IBW/kg (Calculated) : 52.4  Temp (24hrs), Avg:99.8 F (37.7 C), Min:99.8 F (37.7 C), Max:99.8 F (37.7 C)   Recent Labs Lab 09/12/15 0321  WBC 7.1  CREATININE 1.18*    Estimated Creatinine Clearance: 65 mL/min (by C-G formula based on Cr of 1.18).    Allergies  Allergen Reactions  . Morphine And Related Rash    Antimicrobials this admission: Levaquin  Microbiology results: N/A  Thank you for allowing pharmacy to be a part of this patient's care.  Harvel Quale 09/12/2015 8:49 AM

## 2015-09-12 NOTE — H&P (Signed)
History and Physical    [name] [id number] DOB: 07/15/1955 DOA: 09/12/2015   PCP: [name], MD   Patient coming from:  Chief Complaint: Shortness of breath   HPI: [name] is a 60 y.o. female with medical history significant for CAD. AFib, h/o PE on Coumadin,  HTN, HLD, DM, COPDnot on home O2, OSA, CAD, CKD, presenting to the ED by EMS with progressive  shortness of breath and wheezing , left chest pressure 3/10. Had a dry nonproductive cough. Denies increased nasal congestion. Denies any new medications, Denies any dietary indiscretion. Reports weight gain or lower extremity edema.Dry weight is 269 lbs She reports intermittent fevers and chills, no night sweats. Denies nausea, heartburn or change in bowel habits. Denies abdominal pain.  Denies any dysuria. Denies abnormal skin rashes, or neuropathy. Denies any bleeding issues such as epistaxis, hematemesis, hematuria or hematochezia.    Review of Systems: As per HPI otherwise 10 point review of systems negative.  Received DuoNebs, IV LAsix 80 mg IV,O2 with improvement of symptoms   ED Course: BP 143/61 mmHg  Pulse 64  Temp(Src) 99.8 F (37.7 C) (Oral)  Resp 24  Ht 5\' 3"  (1.6 m)  Wt 122.018 kg (269 lb)  BMI 47.66 kg/m2  SpO2 100% Osats were 78 at RA, improved with 3 L O2  CMET shows Na 138 , K 4.1, CO2 27 , Cr 1.18 , glucose 124 , BNP 578.2, troponin <0.30, WBC , hemoglobin 10.1, WBC 7.1 platelets 128k, d-dimer 0.74 , INR 2.46 CXR with CHF pattern without effusion or air leak  EKG without evidence of ACS QTC 507    Past Medical History  Diagnosis Date  . Hypertension   . Depression   . DVT (deep venous thrombosis) ([place]) 10 years ago    numerous/notes 01/18/2013  . PE (pulmonary thromboembolism) ([place]) 3 years ag0    3/notes 01/18/2013  . A-fib ([place])   . Bleeding on Coumadin ([place]) 08/2012; 01/18/2013    [facility] admissions (01/19/2013)  . High cholesterol     "been off RX for this at one time"  (01/18/2013)  . Heart murmur   . CHF (congestive heart failure) ([place])     "2-3 times" (01/19/2013)  . Pneumonia before 2011    "once' (01/18/2013)  . Shortness of breath     "only related to my CHF" (01/18/2013)  . Hypothyroidism   . Type II diabetes mellitus ([place])   . OSA (obstructive sleep apnea)     "sent me for test in 04/2012; never ordered mask, etc" (01/19/2013)  . Anemia   . History of blood transfusion 1983; 04/2012    "3 w/ childbirth; hospitalized for pain" (01/19/2013)  . GERD (gastroesophageal reflux disease)   . [id number])     "maybe weekly" (01/19/2013)  . Migraines     "twice/yr maybe" (01/19/2013)  . Arthritis     "right hip; both knees; left wrist/shoulder; back" (01/19/2013"  . Chronic lower back pain   . Gout   . Anxiety   . Renal disorder     kindey function low; "Metformin was destroying my kidneys" (01/19/2013)  . UTI (urinary tract infection) 08/31/2014  . Swelling of hand 08/31/2014    RT HAND    Past Surgical History  Procedure Laterality Date  . Cesarean section  1983  . Cholecystectomy  ~ 2002  . Vena cava filter placement  2011?  [facility] Eye surgery Bilateral     "multiple" (01/18/2013)  . Cataract extraction w/ intraocular  lens  implant, bilateral Bilateral 2006-2011  . Pars plana vitrectomy Bilateral 2004-2006    "several" (01/18/2013)  . Pars plana repair of retinal deatachment Right   . Refractive surgery Bilateral     "for stigmatism" (01/18/2013)  . Refractive surgery Left ~ 11/2012    "to puff it up cause vision got hazy" (01/18/2013)  . Colonoscopy N/A 01/21/2013    Procedure: COLONOSCOPY;  Surgeon: Beryle Beams, MD;  Location: [facility];  Service: Endoscopy;  Laterality: N/A;  . Cardiac catheterization N/A 03/13/2015    Procedure: Right/Left Heart Cath and Coronary Angiography;  Surgeon: Adrian Prows, MD;  Location: [facility];  Service: Cardiovascular;  Laterality: N/A;    Social History  reports that she has quit  smoking. Her smoking use included Cigarettes. She has a 1.5 pack-year smoking history. She has never used smokeless tobacco. She reports that she drinks alcohol. She reports that she does not use illicit drugs.  Walks unassisted  with cane  with walker Patient is on  wheelchair  Allergies  Allergen Reactions  . Morphine And Related Rash    Family History  Problem Relation Age of Onset  . Cerebral aneurysm Mother   . Hypertension Father   . Cerebral aneurysm Maternal Grandfather   . Cerebral aneurysm Maternal Aunt   . Cancer Maternal Uncle       Prior to Admission medications   Medication Sig Start Date End Date Taking? Authorizing Provider  allopurinol (ZYLOPRIM) 300 MG tablet Take 300 mg by mouth daily.   Yes Historical Provider, MD  aspirin EC 81 MG tablet Take 81 mg by mouth daily.   Yes Historical Provider, MD  calcium-vitamin D 250-100 MG-UNIT per tablet Take 2 tablets by mouth 2 (two) times daily.   Yes Historical Provider, MD  cholecalciferol (VITAMIN D) 1000 UNITS tablet Take 2,000 Units by mouth daily.   Yes Lauraine Rinne, MD  citalopram (CELEXA) 20 MG tablet Take 20 mg by mouth at bedtime.   Yes Historical Provider, MD  ferrous gluconate (FERGON) 324 MG tablet Take 1 tablet (324 mg total) by mouth 2 (two) times daily with a meal. 05/11/12  Yes Ripudeep K Rai, MD  furosemide (LASIX) 40 MG tablet Take 40 mg by mouth daily.   Yes Historical Provider, MD  HYDROcodone-acetaminophen (NORCO/VICODIN) 5-325 MG per tablet Take 1-2 tablets by mouth every 6 (six) hours as needed for severe pain. 09/04/14  Yes Velvet Bathe, MD  insulin aspart (NOVOLOG) 100 UNIT/ML injection Inject 2 Units into the skin 3 (three) times daily with meals. Patient taking differently: Inject 10 Units into the skin 3 (three) times daily with meals.  09/04/14  Yes Velvet Bathe, MD  insulin glargine (LANTUS) 100 unit/mL SOPN Inject 24-26 Units into the skin every morning.    Yes Historical Provider, MD    isosorbide mononitrate (IMDUR) 30 MG 24 hr tablet Take 1 tablet (30 mg total) by mouth daily. 05/11/12  Yes Ripudeep Krystal Eaton, MD  levothyroxine (SYNTHROID, LEVOTHROID) 50 MCG tablet Take 50 mcg by mouth daily at 12 noon.   Yes Historical Provider, MD  MELATONIN PO Take 0.25 mg by mouth at bedtime.   Yes Historical Provider, MD  metoprolol (LOPRESSOR) 25 MG tablet Take 1 tablet (25 mg total) by mouth 2 (two) times daily. 09/04/14  Yes Velvet Bathe, MD  Multiple Vitamin (MULITIVITAMIN WITH MINERALS) TABS Take 1 tablet by mouth daily.   Yes Historical Provider, MD  olmesartan (BENICAR) 40 MG tablet Take  40 mg by mouth daily.   Yes Historical Provider, MD  Omega-3 Fatty Acids (FISH OIL) 1200 MG CAPS Take 1 capsule by mouth 2 (two) times daily.   Yes Historical Provider, MD  warfarin (COUMADIN) 2.5 MG tablet TAKE 7.5 MG (THREE TABLETS) BY MOUTH DAILY EXCEPT TAKE 5 MG (TWO TABLETS) ON MONDAY, WEDNESDAY, FRIDAY OR AS DIRECTED Patient taking differently: Take 7.5 mg on Mon / Wed / Frid. Take 5mg  Nancy Fetter / Beatris Ship Raynelle Dick / Sat 09/07/15  Yes Lennis Marion Downer, MD    Physical Exam:    Filed Vitals:   09/12/15 0515 09/12/15 0600 09/12/15 0645 09/12/15 0754  BP: 143/61 153/62 159/59 142/51  Pulse: 64 65 72 63  Temp:      TempSrc:      Resp: 24 23 20 13   Height:      Weight:      SpO2: 100% 100% 100% 100%      Constitutional: NAD, calm, comfortable on O2 at 3 l Filed Vitals:   09/12/15 0515 09/12/15 0600 09/12/15 0645 09/12/15 0754  BP: 143/61 153/62 159/59 142/51  Pulse: 64 65 72 63  Temp:      TempSrc:      Resp: 24 23 20 13   Height:      Weight:      SpO2: 100% 100% 100% 100%   Eyes: PERRL, lids and conjunctivae normal ENMT: Mucous membranes are moist. Posterior pharynx clear of any exudate or lesions.Normal dentition.  Neck: normal, supple, no masses, no thyromegaly Respiratory: bibasilar crackles bases, no wheezing or rhonchi  Normal respiratory effort. No accessory muscle use.   Cardiovascular: Regular rate and rhythm ,2/6 systolic  / rubs / gallops. 1+ bilateral  extremity edema. 2+ pedal pulses. No carotid bruits.  Abdomen: no tenderness, no masses palpated. No hepatosplenomegaly. Bowel sounds positive.  Musculoskeletal: no clubbing / cyanosis. No joint deformity upper and lower extremities. Good ROM, no contractures. Normal muscle tone.  Skin: venous stasis changes at her lower extremitties no rashes, lesions, ulcers.  Neurologic: CN 2-12 grossly intact. Sensation intact, DTR normal. Strength 5/5 in all 4.  Psychiatric: Normal judgment and insight. Alert and oriented x 3. Normal mood.     Labs on Admission: I have personally reviewed following labs and imaging studies  CBC:  Recent Labs Lab 09/12/15 0321  WBC 7.1  HGB 10.1*  HCT 33.1*  MCV 98.2  PLT 128*    Basic Metabolic Panel:  Recent Labs Lab 09/12/15 0321  NA 138  K 4.1  CL 100*  CO2 27  GLUCOSE 124*  BUN 26*  CREATININE 1.18*  CALCIUM 9.1    GFR: Estimated Creatinine Clearance: 65 mL/min (by C-G formula based on Cr of 1.18).  Liver Function Tests: No results for input(s): AST, ALT, ALKPHOS, BILITOT, PROT, ALBUMIN in the last 168 hours. No results for input(s): LIPASE, AMYLASE in the last 168 hours. No results for input(s): AMMONIA in the last 168 hours.  Coagulation Profile:  Recent Labs Lab 09/12/15 0321  INR 2.46*    Cardiac Enzymes: No results for input(s): CKTOTAL, CKMB, CKMBINDEX, TROPONINI in the last 168 hours.  BNP (last 3 results) No results for input(s): PROBNP in the last 8760 hours.  HbA1C: No results for input(s): HGBA1C in the last 72 hours.  CBG: No results for input(s): GLUCAP in the last 168 hours.  Lipid Profile: No results for input(s): CHOL, HDL, LDLCALC, TRIG, CHOLHDL, LDLDIRECT in the last 72 hours.  Thyroid Function Tests: No results  for input(s): TSH, T4TOTAL, FREET4, T3FREE, THYROIDAB in the last 72 hours.  Anemia Panel: No results  for input(s): VITAMINB12, FOLATE, FERRITIN, TIBC, IRON, RETICCTPCT in the last 72 hours.  Urine analysis:    Component Value Date/Time   COLORURINE YELLOW 08/31/2014 1158   APPEARANCEUR CLOUDY* 08/31/2014 1158   LABSPEC 1.014 08/31/2014 1158   PHURINE 6.0 08/31/2014 1158   GLUCOSEU 100* 08/31/2014 1158   HGBUR LARGE* 08/31/2014 1158   BILIRUBINUR NEGATIVE 08/31/2014 1158   KETONESUR NEGATIVE 08/31/2014 1158   PROTEINUR >300* 08/31/2014 1158   UROBILINOGEN 0.2 08/31/2014 1158   NITRITE NEGATIVE 08/31/2014 1158   LEUKOCYTESUR LARGE* 08/31/2014 1158    Sepsis Labs: @LABRCNTIP (procalcitonin:4,lacticidven:4) )No results found for this or any previous visit (from the past 240 hour(s)).   Radiological Exams on Admission: Dg Chest 2 View  09/12/2015  CLINICAL DATA:  Mid chest pain.  Shortness of breath. EXAM: CHEST  2 VIEW COMPARISON:  01/18/2015 FINDINGS: Cardiomegaly with notable left atrial enlargement. Mitral annular ossification. Stable aortic and hilar contours. Interstitial coarsening with congestive vessels and mild central cuffing. No effusion or air leak. IVC filter. IMPRESSION: CHF pattern. Electronically Signed   By: Monte Fantasia M.D.   On: 09/12/2015 03:34    EKG: Independently reviewed.  Assessment/Plan Principal Problem:   CHF (congestive heart failure) (HCC) Active Problems:   Insulin dependent diabetes mellitus - with Proteinuria   Morbid obesity, + sleep apnea   HTN (hypertension)   HLD (hyperlipidemia)   History of pulmonary embolism, recurrent, 3 seperate episodes.   Presence of IVC filter, placed June 2011 after 3d PE   Diastolic dysfunction, grade 2, EF 65-70% May 2013   Chronic renal insufficiency, stage IV (severe) - due to DM & HTN, SCr improving-1.6   DVT, bil in 08/2011   Anticoagulated on Coumadin, chronically   DVT (deep venous thrombosis), bilateral   CKD (chronic kidney disease) stage 3, GFR 30-59 ml/min   Decreased mobility   Acute hypoxic  respiratory failure likely secondary to  Acute on chronic diastolic CHF and COPD exacerbation. CXR with CHF pattern without effusion or air leak. last echocardiogram in 0000000, Fr 1 diastolic, EF 123456 %.  BNP 578.2  Osats were 78 at RA, improved with 3 L O2. Dry weight 269 lbs, currently same Received  IV lasix 80 mg x 1 in ED  Place  telemetry obs  CHF order set   2 D echo  COPD order set  IV Lasix 40 mg IV bid  Strict I/Os -  Duoneb q 6 hrs prn. If symptoms worsen, then will change it to scheduled nebs Consider steroids if respiratory worsens - Mucinex - O2 prn Incentive spirometry Levaquin for antibiotic coverage Patient will need outpatient pulmonary evaluation to maximize her pulmonary status in view of her history of COPD, OSA, O2 needs during this hospitalization    Atrial Fibrillation CHA2DS2-VASc score 7   On Coumadin  for anticoagulation therapy  Continue Coumadin and B blocker -Continue meds  CAD, last cath in 03/2015  With N.O. CAD EKG without evidence of ACS QTC 507. Trop negative  Continue IImdur, BB, ASA  Hypertension BP 142/51 mmHg  Controlled Continue home BBlocker   Hyperlipidemia Continue home statins   Chronic kidney disease stage  IV  Current Cr 1.18  Lab Results  Component Value Date   CREATININE 1.18* 09/12/2015   CREATININE 1.3* 11/16/2014   CREATININE 1.75* 09/04/2014  No intervention is indicated at this time  Thrombocytopenia, chronic . No  bleeding issues, current plt 128k  No transfusion is indicated at this time Monitor counts closely  Anemia of chronic disease Hemoglobin 10. 1 on admission.   No bleeding issues noted  Monitor with CBC     History of Pulmonary Embolism and DVT, s/p IVC filter and on chronic Coumadin. DDmer 0.76 O2 as needed Continue Coumadin Dr. Marko Plume, Hematology  will be notified of patient's admission as she was to see her in a.m    Type II Diabetes Home regimen includes Current blood sugar level is 124 Lab  Results  Component Value Date   HGBA1C 6.2* 08/31/2014  Hgb A1C  Lantus 1/2 dose, SSI Heart healthy carb modified diet.  Hypothyroidism. Last TSH 1.155 -Continue home Synthroid   Depression Continue Celexa   Deconditioning Consult PT/OT   DVT prophylaxis: Coumadin Code Status:   Full    Family Communication:  Discussed w patient Disposition Plan: Expect patient to be discharged to home after condition improves Consults called:    None Admission status:Tele  Obs    Riata Ikeda E, PA-C [facility]   If 7PM-7AM, please contact night-coverage [email or web address] Password [email or web address]  09/12/2015, 8:14 AM

## 2015-09-13 ENCOUNTER — Other Ambulatory Visit: Payer: Medicare HMO

## 2015-09-13 ENCOUNTER — Observation Stay (HOSPITAL_COMMUNITY): Payer: Medicare HMO

## 2015-09-13 ENCOUNTER — Observation Stay (HOSPITAL_BASED_OUTPATIENT_CLINIC_OR_DEPARTMENT_OTHER): Payer: Medicare HMO

## 2015-09-13 DIAGNOSIS — J441 Chronic obstructive pulmonary disease with (acute) exacerbation: Secondary | ICD-10-CM

## 2015-09-13 DIAGNOSIS — I5033 Acute on chronic diastolic (congestive) heart failure: Secondary | ICD-10-CM | POA: Diagnosis not present

## 2015-09-13 DIAGNOSIS — N189 Chronic kidney disease, unspecified: Secondary | ICD-10-CM | POA: Diagnosis not present

## 2015-09-13 DIAGNOSIS — Z7901 Long term (current) use of anticoagulants: Secondary | ICD-10-CM

## 2015-09-13 DIAGNOSIS — I509 Heart failure, unspecified: Secondary | ICD-10-CM

## 2015-09-13 DIAGNOSIS — Z5181 Encounter for therapeutic drug level monitoring: Secondary | ICD-10-CM

## 2015-09-13 DIAGNOSIS — I5031 Acute diastolic (congestive) heart failure: Secondary | ICD-10-CM | POA: Diagnosis not present

## 2015-09-13 LAB — HEMOGLOBIN A1C
HEMOGLOBIN A1C: 5.2 % (ref 4.8–5.6)
Mean Plasma Glucose: 103 mg/dL

## 2015-09-13 LAB — CBC
HCT: 32.1 % — ABNORMAL LOW (ref 36.0–46.0)
Hemoglobin: 9.8 g/dL — ABNORMAL LOW (ref 12.0–15.0)
MCH: 30.1 pg (ref 26.0–34.0)
MCHC: 30.5 g/dL (ref 30.0–36.0)
MCV: 98.5 fL (ref 78.0–100.0)
PLATELETS: 126 10*3/uL — AB (ref 150–400)
RBC: 3.26 MIL/uL — ABNORMAL LOW (ref 3.87–5.11)
RDW: 16.1 % — AB (ref 11.5–15.5)
WBC: 9 10*3/uL (ref 4.0–10.5)

## 2015-09-13 LAB — ECHOCARDIOGRAM COMPLETE
EERAT: 20.63
EWDT: 215 ms
FS: 34 % (ref 28–44)
HEIGHTINCHES: 63 in
IV/PV OW: 1.12
LA diam end sys: 51 mm
LA vol index: 49 mL/m2
LADIAMINDEX: 2.08 cm/m2
LASIZE: 51 mm
LAVOL: 120 mL
LAVOLA4C: 109 mL
LV E/e'average: 20.63
LVEEMED: 20.63
LVELAT: 6.93 cm/s
MV Dec: 215
MV Peak grad: 8 mmHg
MVPKAVEL: 54.1 m/s
MVPKEVEL: 143 m/s
PW: 16.4 mm — AB (ref 0.6–1.1)
Reg peak vel: 281 cm/s
TDI e' lateral: 6.93
TDI e' medial: 5.55
TR max vel: 281 cm/s
Weight: 4451.2 oz

## 2015-09-13 LAB — COMPREHENSIVE METABOLIC PANEL
ALBUMIN: 3.1 g/dL — AB (ref 3.5–5.0)
ALK PHOS: 62 U/L (ref 38–126)
ALT: 27 U/L (ref 14–54)
AST: 26 U/L (ref 15–41)
Anion gap: 5 (ref 5–15)
BUN: 35 mg/dL — AB (ref 6–20)
CALCIUM: 8.7 mg/dL — AB (ref 8.9–10.3)
CO2: 32 mmol/L (ref 22–32)
CREATININE: 1.38 mg/dL — AB (ref 0.44–1.00)
Chloride: 98 mmol/L — ABNORMAL LOW (ref 101–111)
GFR calc Af Amer: 47 mL/min — ABNORMAL LOW (ref >60)
GFR, EST NON AFRICAN AMERICAN: 41 mL/min — AB (ref >60)
GLUCOSE: 189 mg/dL — AB (ref 65–99)
Potassium: 4.2 mmol/L (ref 3.5–5.1)
Sodium: 135 mmol/L (ref 135–145)
TOTAL PROTEIN: 5.8 g/dL — AB (ref 6.5–8.1)
Total Bilirubin: 1.2 mg/dL (ref 0.3–1.2)

## 2015-09-13 LAB — GLUCOSE, CAPILLARY
GLUCOSE-CAPILLARY: 151 mg/dL — AB (ref 65–99)
Glucose-Capillary: 143 mg/dL — ABNORMAL HIGH (ref 65–99)
Glucose-Capillary: 166 mg/dL — ABNORMAL HIGH (ref 65–99)
Glucose-Capillary: 169 mg/dL — ABNORMAL HIGH (ref 65–99)

## 2015-09-13 LAB — PROTIME-INR
INR: 2.42 — AB (ref 0.00–1.49)
Prothrombin Time: 26.1 seconds — ABNORMAL HIGH (ref 11.6–15.2)

## 2015-09-13 MED ORDER — ZOLPIDEM TARTRATE 5 MG PO TABS
5.0000 mg | ORAL_TABLET | Freq: Once | ORAL | Status: AC
  Administered 2015-09-13: 5 mg via ORAL
  Filled 2015-09-13: qty 1

## 2015-09-13 NOTE — Care Management Obs Status (Signed)
[facility] [place] NOTIFICATION   Patient Details  Name: [name] MRN: [id number] Date of Birth: October 18, 1955   Medicare Observation Status Notification Given:  Yes    Royston Bake, RN 09/13/2015, 4:22 PM

## 2015-09-13 NOTE — Progress Notes (Addendum)
Patient ID: [name], female   DOB: 02-May-1955, 60 y.o.   MRN: [id number]    PROGRESS NOTE    MARYJOSE RODEBAUGH  [id number] DOB: [birth year] DOA: 09/12/2015  PCP: [name], MD   Brief Narrative:  60 y.o. female with known CAD. AFib, h/o PE on Coumadin, HTN, HLD, DM, COPD but not on home O2, OSA, CKD, presenting to the ED by EMS with progressive shortness of breath and wheezing , left chest pressure 3/10 associated with dry and non productive cough.   Assessment & Plan:  Acute hypoxic respiratory failure likely secondary toacute on chronic diastolic CHF and COPD exacerbation - last ECHO in 2013 with grade II diastolic CHF - ECHO pending on this admission, will call cardiology team once results back  - weight is 126 kg this AM, some discrepancy with admission weight  - will continue Lasix 40 mg IV BID for now as Cr is trending up from 1.18 --> 1.38, will not increase dose further  - if Cr continues trending up, will have to discuss with cardiology further plan  - please see COPD management below  - continue to monitor daily weights, strict I/O  Acute exacerbation of COPD  - continue BD's scheduled and as needed - empiric doxycycline started on admission, today is day #2 - IS while awake   Atrial Fibrillation CHA2DS2-VASc score 7  - On Coumadin for anticoagulation therapy  - BB Metoprolol stopped due to HR In 40's - will discuss with cardiology   CAD - last cath in 03/2015 With N.O. CAD EKG without evidence of ACS QTC 507. 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   DVT prophylaxis: on Coumadin  Code Status: Full  Family Communication: Patient at bedside  Disposition Plan: Home once diuresed and off IV Lasxi   Consultants:   Cardiology   WOC  Procedures:   ECHO  Antimicrobials:   Doxycycline 6/7 -->   Subjective: Pt reports feeling better.   Objective: Filed Vitals:   09/12/15 1618 09/13/15 0416 09/13/15 0528 09/13/15 1221  BP: 171/67  121/48 123/49  Pulse: 55  49 44  Temp: 98.5 F (36.9 C)  97.6 F (36.4 C) 98 F (36.7 C)  TempSrc: Oral  Oral Oral  Resp: 18  16 18   Height: 5\' 3"  (1.6 m)     Weight: 123.106 kg (271 lb 6.4 oz) 126.191 kg (278 lb 3.2 oz)    SpO2: 100%  95% 98%    Intake/Output Summary (Last 24 hours) at 09/13/15 1703 Last data filed at 09/13/15 1300  Gross per 24 hour  Intake    460 ml  Output   1300 ml  Net   -840  ml   Filed Weights   09/12/15 0307 09/12/15 1618 09/13/15 0416  Weight: 122.018 kg (269 lb) 123.106 kg (271 lb 6.4 oz) 126.191 kg (278 lb 3.2 oz)    Examination:  General exam: Appears calm and comfortable  Respiratory system: diminished breath sounds at bases with crackles Cardiovascular system: S1 & S2 heard, RRR. No JVD, rubs, gallops or clicks. Distant heart sounds  Gastrointestinal system: Abdomen is nondistended, soft and nontender.  Central nervous system: Alert and oriented. No focal neurological deficits. Extremities: Symmetric 5 x 5 power. LE chronic venous stasis changes with pitting edema +2  Psychiatry: Judgement and insight appear normal. Mood & affect appropriate.   Data Reviewed: I have personally reviewed following labs and  imaging studies  CBC:  Recent Labs Lab 09/12/15 0321 09/13/15 0225  WBC 7.1 9.0  HGB 10.1* 9.8*  HCT 33.1* 32.1*  MCV 98.2 98.5  PLT 128* 123XX123*   Basic Metabolic Panel:  Recent Labs Lab 09/12/15 0321 09/13/15 0225  NA 138 135  K 4.1 4.2  CL 100* 98*  CO2 27 32  GLUCOSE 124* 189*  BUN 26* 35*  CREATININE 1.18* 1.38*  CALCIUM 9.1 8.7*   Liver Function Tests:  Recent Labs Lab 09/13/15 0225  AST 26  ALT 27  ALKPHOS 62  BILITOT 1.2  PROT 5.8*  ALBUMIN 3.1*   Coagulation Profile:  Recent Labs Lab 09/12/15 0321 09/13/15 0225  INR 2.46* 2.42*   HbA1C:  Recent Labs  09/12/15 0321  HGBA1C 5.2   CBG:  Recent Labs Lab 09/12/15 1649 09/12/15 2127 09/13/15 0600 09/13/15 1127 09/13/15 1624  GLUCAP 245* 286* 151* 143* 166*   Urine analysis:    Component Value Date/Time   COLORURINE YELLOW 08/31/2014 1158   APPEARANCEUR CLOUDY* 08/31/2014 1158   LABSPEC 1.014 08/31/2014 1158   PHURINE 6.0 08/31/2014 1158   GLUCOSEU 100* 08/31/2014 1158   HGBUR LARGE* 08/31/2014 1158   BILIRUBINUR NEGATIVE 08/31/2014 1158   KETONESUR NEGATIVE 08/31/2014 1158   PROTEINUR >300* 08/31/2014 1158   UROBILINOGEN 0.2 08/31/2014 1158   NITRITE NEGATIVE 08/31/2014 1158   LEUKOCYTESUR LARGE* 08/31/2014 1158   Radiology Studies: Dg Chest 2 View  09/13/2015  CLINICAL DATA:  Shortness of breath and COPD. EXAM: CHEST  2 VIEW COMPARISON:  09/12/2015 FINDINGS: Stable mild cardiac enlargement. Relatively stable mild edema. No focal airspace consolidation or pleural fluid identified. IMPRESSION: Stable mild pulmonary edema. Electronically Signed   By: Aletta Edouard M.D.   On: 09/13/2015 08:23   Dg Chest 2 View  09/12/2015  CLINICAL DATA:  Mid chest pain.  Shortness of breath. EXAM: CHEST  2 VIEW COMPARISON:  01/18/2015 FINDINGS: Cardiomegaly with notable left atrial enlargement. Mitral annular ossification. Stable aortic and hilar contours. Interstitial coarsening with congestive  vessels and mild central cuffing. No effusion or air leak. IVC filter. IMPRESSION: CHF pattern. Electronically Signed   By: Monte Fantasia M.D.   On: 09/12/2015 03:34   Scheduled Meds: . allopurinol  300 mg Oral Daily  . aspirin EC  81 mg Oral Daily  . citalopram  20 mg Oral QHS  . doxycycline  100 mg Oral Q12H  . furosemide  40 mg Intravenous BID  . insulin aspart  0-9 Units Subcutaneous TID WC  . insulin glargine  24 Units Subcutaneous Daily  . irbesartan  300 mg Oral Daily  . isosorbide mononitrate  30 mg Oral Daily  . levothyroxine  50 mcg Oral QAC breakfast  . metoprolol  25 mg  Oral BID  . sodium chloride flush  3 mL Intravenous Q12H  . warfarin  5 mg Oral Once per day on Sun Tue Thu Sat  . warfarin  7.5 mg Oral Once per day on Mon Wed Fri  . Warfarin - Physician Dosing Inpatient   Does not apply q1800   Continuous Infusions:    LOS: 1 day   Time spent: 20 minutes   Faye Ramsay, MD [facility] Pager [phone]  If 7PM-7AM, please contact night-coverage [email or web address] Password [email or web address] 09/13/2015, 5:03 PM

## 2015-09-13 NOTE — Progress Notes (Signed)
  Echocardiogram 2D Echocardiogram has been performed.  [name] 09/13/2015, 4:26 PM

## 2015-09-13 NOTE — Discharge Instructions (Signed)

## 2015-09-14 ENCOUNTER — Observation Stay (HOSPITAL_COMMUNITY): Payer: Medicare HMO

## 2015-09-14 DIAGNOSIS — D638 Anemia in other chronic diseases classified elsewhere: Secondary | ICD-10-CM | POA: Diagnosis present

## 2015-09-14 DIAGNOSIS — Z885 Allergy status to narcotic agent status: Secondary | ICD-10-CM | POA: Diagnosis not present

## 2015-09-14 DIAGNOSIS — I809 Phlebitis and thrombophlebitis of unspecified site: Secondary | ICD-10-CM | POA: Diagnosis not present

## 2015-09-14 DIAGNOSIS — I272 Other secondary pulmonary hypertension: Secondary | ICD-10-CM | POA: Diagnosis present

## 2015-09-14 DIAGNOSIS — Z9981 Dependence on supplemental oxygen: Secondary | ICD-10-CM | POA: Diagnosis not present

## 2015-09-14 DIAGNOSIS — Z7901 Long term (current) use of anticoagulants: Secondary | ICD-10-CM | POA: Diagnosis not present

## 2015-09-14 DIAGNOSIS — Z87891 Personal history of nicotine dependence: Secondary | ICD-10-CM | POA: Diagnosis not present

## 2015-09-14 DIAGNOSIS — I4891 Unspecified atrial fibrillation: Secondary | ICD-10-CM | POA: Diagnosis present

## 2015-09-14 DIAGNOSIS — Z809 Family history of malignant neoplasm, unspecified: Secondary | ICD-10-CM | POA: Diagnosis not present

## 2015-09-14 DIAGNOSIS — Z7982 Long term (current) use of aspirin: Secondary | ICD-10-CM | POA: Diagnosis not present

## 2015-09-14 DIAGNOSIS — Z794 Long term (current) use of insulin: Secondary | ICD-10-CM | POA: Diagnosis not present

## 2015-09-14 DIAGNOSIS — J9601 Acute respiratory failure with hypoxia: Secondary | ICD-10-CM | POA: Diagnosis present

## 2015-09-14 DIAGNOSIS — E039 Hypothyroidism, unspecified: Secondary | ICD-10-CM | POA: Diagnosis present

## 2015-09-14 DIAGNOSIS — K219 Gastro-esophageal reflux disease without esophagitis: Secondary | ICD-10-CM | POA: Diagnosis present

## 2015-09-14 DIAGNOSIS — Z6841 Body Mass Index (BMI) 40.0 and over, adult: Secondary | ICD-10-CM | POA: Diagnosis not present

## 2015-09-14 DIAGNOSIS — E7800 Pure hypercholesterolemia, unspecified: Secondary | ICD-10-CM | POA: Diagnosis present

## 2015-09-14 DIAGNOSIS — I252 Old myocardial infarction: Secondary | ICD-10-CM | POA: Diagnosis not present

## 2015-09-14 DIAGNOSIS — Z8249 Family history of ischemic heart disease and other diseases of the circulatory system: Secondary | ICD-10-CM | POA: Diagnosis not present

## 2015-09-14 DIAGNOSIS — Z5181 Encounter for therapeutic drug level monitoring: Secondary | ICD-10-CM | POA: Diagnosis not present

## 2015-09-14 DIAGNOSIS — F419 Anxiety disorder, unspecified: Secondary | ICD-10-CM | POA: Diagnosis present

## 2015-09-14 DIAGNOSIS — I5033 Acute on chronic diastolic (congestive) heart failure: Secondary | ICD-10-CM | POA: Diagnosis present

## 2015-09-14 DIAGNOSIS — I5031 Acute diastolic (congestive) heart failure: Secondary | ICD-10-CM

## 2015-09-14 DIAGNOSIS — E876 Hypokalemia: Secondary | ICD-10-CM | POA: Diagnosis present

## 2015-09-14 DIAGNOSIS — J441 Chronic obstructive pulmonary disease with (acute) exacerbation: Secondary | ICD-10-CM | POA: Diagnosis present

## 2015-09-14 DIAGNOSIS — E1122 Type 2 diabetes mellitus with diabetic chronic kidney disease: Secondary | ICD-10-CM | POA: Diagnosis present

## 2015-09-14 DIAGNOSIS — G4733 Obstructive sleep apnea (adult) (pediatric): Secondary | ICD-10-CM | POA: Diagnosis present

## 2015-09-14 DIAGNOSIS — I509 Heart failure, unspecified: Secondary | ICD-10-CM | POA: Diagnosis present

## 2015-09-14 DIAGNOSIS — I739 Peripheral vascular disease, unspecified: Secondary | ICD-10-CM | POA: Diagnosis present

## 2015-09-14 DIAGNOSIS — T801XXA Vascular complications following infusion, transfusion and therapeutic injection, initial encounter: Secondary | ICD-10-CM | POA: Diagnosis not present

## 2015-09-14 DIAGNOSIS — N184 Chronic kidney disease, stage 4 (severe): Secondary | ICD-10-CM | POA: Diagnosis present

## 2015-09-14 DIAGNOSIS — D696 Thrombocytopenia, unspecified: Secondary | ICD-10-CM | POA: Diagnosis present

## 2015-09-14 DIAGNOSIS — Z86711 Personal history of pulmonary embolism: Secondary | ICD-10-CM | POA: Diagnosis not present

## 2015-09-14 DIAGNOSIS — F329 Major depressive disorder, single episode, unspecified: Secondary | ICD-10-CM | POA: Diagnosis present

## 2015-09-14 DIAGNOSIS — I2510 Atherosclerotic heart disease of native coronary artery without angina pectoris: Secondary | ICD-10-CM | POA: Diagnosis present

## 2015-09-14 DIAGNOSIS — E785 Hyperlipidemia, unspecified: Secondary | ICD-10-CM | POA: Diagnosis present

## 2015-09-14 DIAGNOSIS — I4892 Unspecified atrial flutter: Secondary | ICD-10-CM | POA: Diagnosis present

## 2015-09-14 DIAGNOSIS — I130 Hypertensive heart and chronic kidney disease with heart failure and stage 1 through stage 4 chronic kidney disease, or unspecified chronic kidney disease: Secondary | ICD-10-CM | POA: Diagnosis present

## 2015-09-14 DIAGNOSIS — Z79899 Other long term (current) drug therapy: Secondary | ICD-10-CM | POA: Diagnosis not present

## 2015-09-14 DIAGNOSIS — Z86718 Personal history of other venous thrombosis and embolism: Secondary | ICD-10-CM | POA: Diagnosis not present

## 2015-09-14 DIAGNOSIS — N189 Chronic kidney disease, unspecified: Secondary | ICD-10-CM | POA: Diagnosis not present

## 2015-09-14 LAB — PROTIME-INR
INR: 2.93 — AB (ref 0.00–1.49)
PROTHROMBIN TIME: 30.1 s — AB (ref 11.6–15.2)

## 2015-09-14 LAB — CBC
HEMATOCRIT: 32.1 % — AB (ref 36.0–46.0)
Hemoglobin: 9.7 g/dL — ABNORMAL LOW (ref 12.0–15.0)
MCH: 30 pg (ref 26.0–34.0)
MCHC: 30.2 g/dL (ref 30.0–36.0)
MCV: 99.4 fL (ref 78.0–100.0)
PLATELETS: 132 10*3/uL — AB (ref 150–400)
RBC: 3.23 MIL/uL — ABNORMAL LOW (ref 3.87–5.11)
RDW: 16.3 % — AB (ref 11.5–15.5)
WBC: 7.4 10*3/uL (ref 4.0–10.5)

## 2015-09-14 LAB — GLUCOSE, CAPILLARY
GLUCOSE-CAPILLARY: 132 mg/dL — AB (ref 65–99)
GLUCOSE-CAPILLARY: 155 mg/dL — AB (ref 65–99)
GLUCOSE-CAPILLARY: 138 mg/dL — AB (ref 65–99)
Glucose-Capillary: 101 mg/dL — ABNORMAL HIGH (ref 65–99)

## 2015-09-14 LAB — BASIC METABOLIC PANEL
ANION GAP: 7 (ref 5–15)
BUN: 43 mg/dL — ABNORMAL HIGH (ref 6–20)
CALCIUM: 8.3 mg/dL — AB (ref 8.9–10.3)
CO2: 30 mmol/L (ref 22–32)
CREATININE: 1.59 mg/dL — AB (ref 0.44–1.00)
Chloride: 102 mmol/L (ref 101–111)
GFR, EST AFRICAN AMERICAN: 40 mL/min — AB (ref >60)
GFR, EST NON AFRICAN AMERICAN: 34 mL/min — AB (ref >60)
GLUCOSE: 88 mg/dL (ref 65–99)
Potassium: 4.1 mmol/L (ref 3.5–5.1)
Sodium: 139 mmol/L (ref 135–145)

## 2015-09-14 LAB — TROPONIN I
TROPONIN I: 0.07 ng/mL — AB (ref <0.031)
Troponin I: 0.06 ng/mL — ABNORMAL HIGH (ref <0.031)
Troponin I: 0.06 ng/mL — ABNORMAL HIGH (ref <0.031)

## 2015-09-14 MED ORDER — WARFARIN SODIUM 5 MG PO TABS
5.0000 mg | ORAL_TABLET | Freq: Once | ORAL | Status: AC
  Administered 2015-09-14: 5 mg via ORAL
  Filled 2015-09-14: qty 1

## 2015-09-14 MED ORDER — ISOSORB DINITRATE-HYDRALAZINE 20-37.5 MG PO TABS
1.0000 | ORAL_TABLET | Freq: Three times a day (TID) | ORAL | Status: DC
  Administered 2015-09-14 – 2015-09-18 (×12): 1 via ORAL
  Filled 2015-09-14 (×12): qty 1

## 2015-09-14 MED ORDER — IPRATROPIUM-ALBUTEROL 0.5-2.5 (3) MG/3ML IN SOLN
3.0000 mL | Freq: Two times a day (BID) | RESPIRATORY_TRACT | Status: DC
  Administered 2015-09-14 – 2015-09-15 (×3): 3 mL via RESPIRATORY_TRACT
  Filled 2015-09-14 (×4): qty 3

## 2015-09-14 MED ORDER — WARFARIN - PHARMACIST DOSING INPATIENT
Freq: Every day | Status: DC
  Administered 2015-09-14: 18:00:00
  Administered 2015-09-17: 1

## 2015-09-14 MED ORDER — METOLAZONE 5 MG PO TABS
5.0000 mg | ORAL_TABLET | Freq: Every day | ORAL | Status: AC
  Administered 2015-09-14 – 2015-09-16 (×3): 5 mg via ORAL
  Filled 2015-09-14 (×3): qty 1

## 2015-09-14 MED ORDER — LORAZEPAM 1 MG PO TABS
1.0000 mg | ORAL_TABLET | Freq: Two times a day (BID) | ORAL | Status: DC | PRN
  Administered 2015-09-14 – 2015-09-17 (×4): 1 mg via ORAL
  Filled 2015-09-14 (×4): qty 1

## 2015-09-14 NOTE — Consult Note (Signed)
WOC wound consult note Reason for Consult: LE venous stasis changes Patient with edema bilateral LE and strong palpable pulses  Wound type: weeping related to edema and venous stasis changes Drainage (amount, consistency, odor) serous Hemosiderin staining circumferentially on each leg, gaiter area Dressing procedure/placement/frequency: Will have orthopedic techs place Unna' boots bilaterally to reduce swelling and control weeping.  Pt will need to follow up with her primary care MD for measurement and script for long term compression stockings. I have explained that to the patient as well as the etiology of venous stasis.  She is thankful to know why her legs have been dark like this for a while.   Pt will need Unna's boots change early next week for assessment by [facility] or in her primary care MD's office.  Discussed POC with patient and bedside nurse.  Re consult if needed, will not follow at this time. Thanks  Mary-Ann Pennella Kellogg, [place] [phone])

## 2015-09-14 NOTE — Evaluation (Signed)
Physical Therapy Evaluation Patient Details Name: [name] MRN: [id number] DOB: [birth year] Today's Date: 09/14/2015   History of Present Illness  Pt is a 60 y.o. female with known CAD. AFib, h/o PE on Coumadin, COPD but not on home O2, OSA, CKD, gout, anxiety, presenting to the ED by EMS with progressive shortness of breath and wheezing , left chest pressure 3/10 associated with dry and non productive cough. Admitting dx: acute hypoxic respiratory failure likely secondary toacute on chronic diastolic CHF and COPD exacerbation.      Clinical Impression  Pt admitted with above diagnosis. Pt currently with functional limitations due to the deficits listed below (see PT Problem List). PTA pt Ind using cane outside home only.  Session limited due to c/o dizziness standing at bedside. Pt reports she has been having jaw pain for a few days and is now having pain in her Lt neck and chest 3/10.  RN notified immediately who contacted cardiologist.  See vitals below.  Anticipate that once pt able to tolerate ambulating she will progress well. Pt will benefit from skilled PT to increase their independence and safety with mobility to allow discharge to the venue listed below.      Follow Up Recommendations Outpatient PT (to address suspected symptomatic suboccipital musculature)    Equipment Recommendations  None recommended by PT    Recommendations for Other Services       Precautions / Restrictions Precautions Precautions: Fall Restrictions Weight Bearing Restrictions: No      Mobility  Bed Mobility Overal bed mobility: Modified Independent             General bed mobility comments: Sligthly increased time and effort  Transfers Overall transfer level: Needs assistance Equipment used: None Transfers: Sit to/from Stand Sit to Stand: Min guard         General transfer comment: Sit>stand x2 w/ pt c/o dizziness each time and needing to sit down.  Pt reports she was just given  medications from RN.  BP sitting EOB 172/53, retaken ~5 mins later 162/58 sitting EOB.  SpO2 98%, HR mid-upper 50s.    Ambulation/Gait         Gait velocity: unable to assess due to +dizziness      Stairs            Wheelchair Mobility    Modified Rankin (Stroke Patients Only)       Balance Overall balance assessment: Needs assistance Sitting-balance support: Feet supported;No upper extremity supported Sitting balance-[name]: Fair Sitting balance - Comments: Min guard sitting EOB due to dizziness   Standing balance support: No upper extremity supported;During functional activity Standing balance-[name]: Poor Standing balance comment: +dizziness, pt has to sit down                             Pertinent Vitals/Pain Pain Assessment: 0-10 Pain Score: 3  Pain Location: chest, Lt neck, jaw (see notes below) Pain Descriptors / Indicators: Aching;Discomfort;Grimacing Pain Intervention(s): Limited activity within patient's tolerance;Monitored during session;Other (comment) (RN notified immediately who notified cardiologist)    Home Living Family/patient expects to be discharged to:: Private residence Living Arrangements: Alone Available Help at Discharge: Family;Available PRN/intermittently Type of Home: House Home Access: Stairs to enter Entrance Stairs-Rails: Chemical engineer of Steps: 4 Home Layout: One level Home Equipment: Cane - single point;Tub bench;Walker - 2 wheels      Prior Function Level of Independence: Independent with assistive device(s)  Comments: Uses cane when outside home.  Denies falls over the past 6 months.       Hand Dominance   Dominant Hand: Right    Extremity/Trunk Assessment   Upper Extremity Assessment: Overall WFL for tasks assessed           Lower Extremity Assessment: RLE deficits/detail;LLE deficits/detail RLE Deficits / Details: Hemosidirin staining LLE Deficits /  Details: Hemosidirin stainin     Communication   Communication: No difficulties  Cognition Arousal/Alertness: Awake/alert Behavior During Therapy: WFL for tasks assessed/performed Overall Cognitive Status: Within Functional Limits for tasks assessed                      General Comments General comments (skin integrity, edema, etc.): Pt reports she has been having jaw pain for a few days and is now having pain in her Lt neck and chest 3/10.  RN notified immediately who contacted cardiologist.  Likely unrelated pt reports having chronic neck pain associated w/ headaches.  Suboccipital musculature tender to palpation.  Recommending OPPT to address this.    Exercises        Assessment/Plan    PT Assessment Patient needs continued PT services  PT Diagnosis Difficulty walking;Acute pain   PT Problem List Decreased activity tolerance;Decreased balance;Pain  PT Treatment Interventions DME instruction;Gait training;Stair training;Functional mobility training;Therapeutic activities;Therapeutic exercise;Balance training;Patient/family education   PT Goals (Current goals can be found in the Care Plan section) Acute Rehab PT Goals Patient Stated Goal: to feel better PT Goal Formulation: With patient Time For Goal Achievement: 09/28/15 Potential to Achieve Goals: Good    Frequency Min 3X/week   Barriers to discharge        Co-evaluation               End of Session Equipment Utilized During Treatment: Gait belt Activity Tolerance: Treatment limited secondary to medical complications (Comment);Patient limited by pain (+dizziness) Patient left: in bed;with call bell/phone within reach;with bed alarm set Nurse Communication: Mobility status;Other (comment) (symptoms documented above; vital signs)    Functional Assessment Tool Used: Clinical Judgement Functional Limitation: Mobility: Walking and moving around Mobility: Walking and Moving Around Current Status [id number]): At  least 1 percent but less than 20 percent impaired, limited or restricted Mobility: Walking and Moving Around Goal Status [id number]): At least 1 percent but less than 20 percent impaired, limited or restricted    Time: 1041-1106 PT Time Calculation (min) (ACUTE ONLY): 25 min   Charges:   PT Evaluation $PT Eval Low Complexity: 1 Procedure PT Treatments $Therapeutic Activity: 8-22 mins   PT G Codes:   PT G-Codes **NOT FOR INPATIENT CLASS** Functional Assessment Tool Used: Clinical Judgement Functional Limitation: Mobility: Walking and moving around Mobility: Walking and Moving Around Current Status [id number]): At least 1 percent but less than 20 percent impaired, limited or restricted Mobility: Walking and Moving Around Goal Status [id number]): At least 1 percent but less than 20 percent impaired, limited or restricted   Collie Siad PT, DPT  Pager: [phone] Phone: [phone] 09/14/2015, 11:32 AM

## 2015-09-14 NOTE — Progress Notes (Signed)
ANTICOAGULATION CONSULT NOTE - Initial Consult  Pharmacy Consult for Warfarin Indication: Hx Afib/PE  Allergies  Allergen Reactions  . Morphine And Related Rash    Patient Measurements: Height: 5\' 3"  (160 cm) Weight: 278 lb 6.4 oz (126.281 kg) (scale a) IBW/kg (Calculated) : 52.4  Vital Signs: Temp: 99.6 F (37.6 C) (06/09 0749) Temp Source: Oral (06/09 0749) BP: 151/42 mmHg (06/09 0749) Pulse Rate: 52 (06/09 0749)  Labs:  Recent Labs  09/12/15 0321 09/13/15 0225 09/14/15 0239  HGB 10.1* 9.8* 9.7*  HCT 33.1* 32.1* 32.1*  PLT 128* 126* 132*  LABPROT 26.3* 26.1* 30.1*  INR 2.46* 2.42* 2.93*  CREATININE 1.18* 1.38* 1.59*    Estimated Creatinine Clearance: 49.3 mL/min (by C-G formula based on Cr of 1.59).   Medical History: Past Medical History  Diagnosis Date  . Hypertension   . Depression   . DVT (deep venous thrombosis) ([place]) 10 years ago    numerous/notes 01/18/2013  . PE (pulmonary thromboembolism) ([place]) 3 years ag0    3/notes 01/18/2013  . A-fib ([place])   . Bleeding on Coumadin ([place]) 08/2012; 01/18/2013    [facility] admissions (01/19/2013)  . High cholesterol     "been off RX for this at one time" (01/18/2013)  . Heart murmur   . CHF (congestive heart failure) ([place])     "2-3 times" (01/19/2013)  . Pneumonia before 2011    "once' (01/18/2013)  . Shortness of breath     "only related to my CHF" (01/18/2013)  . Hypothyroidism   . Type II diabetes mellitus ([place])   . OSA (obstructive sleep apnea)     "sent me for test in 04/2012; never ordered mask, etc" (01/19/2013)  . Anemia   . History of blood transfusion 1983; 04/2012    "3 w/ childbirth; hospitalized for pain" (01/19/2013)  . GERD (gastroesophageal reflux disease)   . [id number])     "maybe weekly" (01/19/2013)  . Migraines     "twice/yr maybe" (01/19/2013)  . Arthritis     "right hip; both knees; left wrist/shoulder; back" (01/19/2013"  . Chronic lower back pain   . Gout   . Anxiety   .  Renal disorder     kindey function low; "Metformin was destroying my kidneys" (01/19/2013)  . UTI (urinary tract infection) 08/31/2014  . Swelling of hand 08/31/2014    RT HAND    Assessment: 33 YOF who presented on 6/7 with SOB concerning for AoCHF vs COPD exacerbation. PTA the patient was on warfarin for hx PE/Afib - originally ordered for MD dosing and now to transition on 6/9 to Rx dosing.  INR today remains therapeutic (INR 2.93 << 2.43, goal of 2-3), CBC stable - no overt s/sx of bleeding noted.   PTA the patient was on 5 mg daily EXCEPT for 7.5 mg on MWF. The patient has been re-educated this admission.   Goal of Therapy:  INR 2-3   Plan:  1. Warfarin 5 mg x 1 dose at 1800 today 2. Daily PT/INR 3. Will continue to monitor for any signs/symptoms of bleeding and will follow up with PT/INR in the a.m.   Alycia Rossetti, PharmD, BCPS Clinical Pharmacist Pager: [phone] 09/14/2015 9:27 AM

## 2015-09-14 NOTE — Progress Notes (Signed)
For case manager:  Please review if pt meets criteria for inpatient stay given multiple comorbid conditions including acute on chronic diastolic CHF with underlying CKD, stage III and now with Cr trending up on IV Lasix. Pt is still requiring IV Lasix with no plan to change to PO until clinically improved.   Thank you   Faye Ramsay, MD  [facility] Pager [phone]  If 7PM-7AM, please contact night-coverage [email or web address] Password [email or web address]

## 2015-09-14 NOTE — Progress Notes (Signed)
Orthopedic Tech Progress Note Patient Details:  [name] [birth year] [id number]  Ortho Devices Type of Ortho Device: Louretta Parma boot Ortho Device/Splint Interventions: Application   [name] 09/14/2015, 1:53 PM

## 2015-09-14 NOTE — Progress Notes (Signed)
Patient ID: [name], female   DOB: 07-23-1955, 59 y.o.   MRN: [id number]    PROGRESS NOTE    [name]  [id number] DOB: 12-22-1955 DOA: 09/12/2015  PCP: [name], MD   Brief Narrative:  59 y.o. female with known CAD. AFib, h/o PE on Coumadin, HTN, HLD, DM, COPD but not on home O2, OSA, CKD, presenting to the ED by EMS with progressive shortness of breath and wheezing , left chest pressure 3/10 associated with dry and non productive cough.   Assessment & Plan:  Acute hypoxic respiratory failure likely secondary toacute on chronic diastolic CHF and COPD exacerbation - last ECHO in 2013 with grade II diastolic CHF - ECHO on this admission indicative of worsening diastolic CHF - troponins have remained rather flat since admission, 0.06 this AM - weight is 126 kg on admission and unchanged this AM - pt is on Lasix 40 mg IV BID and Cr continue trending up from 1.18 --> 1.38 --> 1.59 - I have consulted her cardiologist to assist with further lasix dosing as this presents challenge due to CKD  - continue to monitor daily weights, strict I/O  Acute exacerbation of COPD  - pt still with significant coughing and mostly non productive  - continue BD's scheduled and as needed - empiric doxycycline started on admission, today is day #3 - will go ahead and get CT chest for cleared evaluation  - IS while awake   Atrial Fibrillation CHA2DS2-VASc score 7  - On Coumadin for anticoagulation therapy  - BB Metoprolol stopped due to HR In 40's - HR in 50's this AM off metoprolol   CAD - last cath in 03/2015 With N.O. CAD EKG without evidence of ACS QTC 507. Trop negative  - Continue Imdur, ASA - holding Metoprolol until HR stabilizes   Hypertension, essential  - reasonable control on Imdur and Irbesartan   Hyperlipidemia - Continue home statins  Chronic kidney disease stage III - IV - Cr slightly up in the past 24 hours and likely from IV Lasix - cardiology  assistance requested to guide [place] in further Lasix dose adjustment  - BMP in AM  Thrombocytopenia, chronic - mild, monitor and with no evidence of active bleeding  - CBC in AM  Anemia of chronic disease - no signs of bleeding - Hg stable in the past 24 hours  - CBC in AM  History of Pulmonary Embolism and DVT, 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   DVT prophylaxis: on Coumadin  Code Status: Full  Family Communication: Patient at bedside, say she is not sure who would I call for an update Disposition Plan: Home once diuresed and off IV Lasix  Consultants:   Cardiology   WOC  Procedures:   Echocardiogram 09/13/2015:  Severe focal basal and moderate concentric LVH. LVEF 55-60%. No regional wall motion abnormalities. Grade 3 diastolic dysfunction. Moderate to severely calcified aortic valve annulus with mild regurgitation. Severely calcified mitral valve annulus with mild regurgitation. Left and right atria severely dilated. RV moderately dilated with mild to moderately reduced systolic function. Probable medium sized PFO. Mild TR, mild PR. PAS P 47 mmHg.  Left and right heart catheterization 03/13/2015:  No significant coronary artery disease by coronary angiography, circumflex coronary artery and LAD have separate ostia and has mild diffuse disease. Right coronary artery is dominant and appears normal. Moderate to severe mitral and left calcification. Normal LV systolic function.  PA pressure 54/22 with a mean of 39 mmHg. After intraoperative pulmonary arterial nitroglycerin, PA pressure reduced to 41/13 with a mean of 27 mmHg. Elevated  cardiac output and cardiac index. Cardiac output 8.95, cardiac index 4.09 probably due to morbid obesity. No evidence of intracardiac shunting.  Antimicrobials:   Doxycycline 6/7 -->  Subjective: Pt reports more short of breath, more non productive cough. Also rather emotional this AM, says she has two dogs and person taking care of them at home has been neglecting her dogs. She feels lonely.  Objective: Filed Vitals:   09/13/15 0528 09/13/15 1221 09/13/15 2106 09/14/15 0544  BP: 121/48 123/49 140/50 162/59  Pulse: 49 44 54 57  Temp: 97.6 F (36.4 C) 98 F (36.7 C) 98.1 F (36.7 C) 99.3 F (37.4 C)  TempSrc: Oral Oral Oral Oral  Resp: 16 18 18 18   Height:      Weight:    126.281 kg (278 lb 6.4 oz)  SpO2: 95% 98% 100% 98%    Intake/Output Summary (Last 24 hours) at 09/14/15 0639 Last data filed at 09/14/15 0544  Gross per 24 hour  Intake    700 ml  Output   1300 ml  Net   -600 ml   Filed Weights   09/12/15 1618 09/13/15 0416 09/14/15 0544  Weight: 123.106 kg (271 lb 6.4 oz) 126.191 kg (278 lb 3.2 oz) 126.281 kg (278 lb 6.4 oz)    Examination:  General exam: Appears calm and comfortable  Respiratory system: diminished breath sounds at bases with crackles Cardiovascular system: S1 & S2 heard, mild bradycardia, SEM 5/6. No rubs, gallops or clicks. Distant heart sounds  Gastrointestinal system: Abdomen is nondistended, soft and nontender.  Central nervous system: Alert and oriented. No focal neurological deficits. Extremities: Symmetric 5 x 5 power. LE chronic venous stasis changes with pitting edema +2  Psychiatry: Judgement and insight appear normal. Teary eyed   Data Reviewed: I have personally reviewed following labs and imaging studies  CBC:  Recent Labs Lab 09/12/15 0321 09/13/15 0225 09/14/15 0239  WBC 7.1 9.0 7.4  HGB 10.1* 9.8* 9.7*  HCT 33.1* 32.1* 32.1*  MCV 98.2 98.5 99.4  PLT 128* 126* [id number]*   Basic Metabolic Panel:  Recent Labs Lab  09/12/15 0321 09/13/15 0225 09/14/15 0239  NA 138 135 139  K 4.1 4.2 4.1  CL 100* 98* 102  CO2 27 32 30  GLUCOSE 124* 189* 88  BUN 26* 35* 43*  CREATININE 1.18* 1.38* 1.59*  CALCIUM 9.1 8.7* 8.3*   Liver Function Tests:  Recent Labs Lab 09/13/15 0225  AST 26  ALT 27  ALKPHOS 62  BILITOT 1.2  PROT 5.8*  ALBUMIN 3.1*   Coagulation Profile:  Recent Labs Lab 09/12/15 0321 09/13/15 0225 09/14/15 0239  INR 2.46* 2.42* 2.93*   HbA1C:  Recent Labs  09/12/15 0321  HGBA1C 5.2   CBG:  Recent Labs Lab 09/13/15 0600 09/13/15 1127 09/13/15 1624 09/13/15 2102 09/14/15 0610  GLUCAP 151* 143* 166* 169* 101*   Urine analysis:    Component Value Date/Time   COLORURINE YELLOW 08/31/2014 1158   APPEARANCEUR CLOUDY* 08/31/2014 1158   LABSPEC 1.014 08/31/2014 1158   PHURINE 6.0 08/31/2014 1158  GLUCOSEU 100* 08/31/2014 1158   HGBUR LARGE* 08/31/2014 1158   BILIRUBINUR NEGATIVE 08/31/2014 1158   KETONESUR NEGATIVE 08/31/2014 1158   PROTEINUR >300* 08/31/2014 1158   UROBILINOGEN 0.2 08/31/2014 1158   NITRITE NEGATIVE 08/31/2014 1158   LEUKOCYTESUR LARGE* 08/31/2014 1158   Radiology Studies: Dg Chest 2 View  09/13/2015  CLINICAL DATA:  Shortness of breath and COPD. EXAM: CHEST  2 VIEW COMPARISON:  09/12/2015 FINDINGS: Stable mild cardiac enlargement. Relatively stable mild edema. No focal airspace consolidation or pleural fluid identified. IMPRESSION: Stable mild pulmonary edema. Electronically Signed   By: Aletta Edouard M.D.   On: 09/13/2015 08:23   Scheduled Meds: . allopurinol  300 mg Oral Daily  . aspirin EC  81 mg Oral Daily  . citalopram  20 mg Oral QHS  . doxycycline  100 mg Oral Q12H  . furosemide  40 mg Intravenous BID  . insulin aspart  0-9 Units Subcutaneous TID WC  . insulin glargine  24 Units Subcutaneous Daily  . irbesartan  300 mg Oral Daily  . isosorbide mononitrate  30 mg Oral Daily  . levothyroxine  50 mcg Oral QAC breakfast  . sodium  chloride flush  3 mL Intravenous Q12H  . warfarin  5 mg Oral Once per day on Sun Tue Thu Sat  . warfarin  7.5 mg Oral Once per day on Mon Wed Fri  . Warfarin - Physician Dosing Inpatient   Does not apply q1800   Continuous Infusions:    LOS: 2 days   Time spent: 20 minutes   Faye Ramsay, MD [facility] Pager [phone]  If 7PM-7AM, please contact night-coverage [email or web address] Password [facility] 09/14/2015, 6:39 AM

## 2015-09-14 NOTE — Consult Note (Signed)
CARDIOLOGY CONSULT NOTE  Patient ID: [name] MRN: [id number] DOB/AGE: March 18, 1956 60 y.o.  Admit date: 09/12/2015 Referring Physician: Internal Medicine Primary Physician:  [name], MD Reason for Consultation: Acute on chronic diastolic heart failure  HPI: [name] is a 60 y.o. female with a history of hypertension, hyperlipidemia, diabetes mellitus, morbid obesity, chronic diastolic dysfunction, history of bilateral DVTs and history of pulmonary embolism in May 2013, has a IVC filter in place, placed in 2014, and paroxysmal atrial flutter.  She also has stage 3 chronic kidney disease and chronic anemia.  Due to recurrent episodes of dyspnea symptoms and chest pain symptoms, she underwent coronary angiography and right heart catheterization in December 2016 essentially revealing noncritical coronary artery disease and moderate pulmonary hypertension with markedly elevated LVEDP and pulmonary capillary wedge pressure suggestive of LV diastolic heart failure. Discussed at that time that diastolic dysfunction due to hypertension and morbid obesity and discussed bariatric surgery as efforts in weight loss had been unsuccessful.   She presented to the hospital on 09/12/2015 with complaints of worsening shortness of breath and chest pain over a 2 day period.  She had O2 sats in the 80s and low 90s on room air with desaturations to the mid 70s with ambulation.  Symptoms improved with nitroglycerin and IV Lasix.  BNP elevated at 578 and chest x-ray revealed mild pulmonary edema, therefore she was admitted for acute on chronic diastolic heart failure.  Echocardiogram was performed yesterday to reevaluate LVEF which remains stable at 55-60% with grade 3 diastolic dysfunction and PASP 47 mmHg. Symptoms continue to improve and she is currently off of supplemental O2. We were consulted in regards to continued management of diastolic heart failure as renal function has slightly declined with IV  furosemide.   Past Medical History  Diagnosis Date  . Hypertension   . Depression   . DVT (deep venous thrombosis) ([place]) 10 years ago    numerous/notes 01/18/2013  . PE (pulmonary thromboembolism) ([place]) 3 years ag0    3/notes 01/18/2013  . A-fib ([place])   . Bleeding on Coumadin ([place]) 08/2012; 01/18/2013    [facility] admissions (01/19/2013)  . High cholesterol     "been off RX for this at one time" (01/18/2013)  . Heart murmur   . CHF (congestive heart failure) ([place])     "2-3 times" (01/19/2013)  . Pneumonia before 2011    "once' (01/18/2013)  . Shortness of breath     "only related to my CHF" (01/18/2013)  . Hypothyroidism   . Type II diabetes mellitus ([place])   . OSA (obstructive sleep apnea)     "sent me for test in 04/2012; never ordered mask, etc" (01/19/2013)  . Anemia   . History of blood transfusion 1983; 04/2012    "3 w/ childbirth; hospitalized for pain" (01/19/2013)  . GERD (gastroesophageal reflux disease)   . [id number])     "maybe weekly" (01/19/2013)  . Migraines     "twice/yr maybe" (01/19/2013)  . Arthritis     "right hip; both knees; left wrist/shoulder; back" (01/19/2013"  . Chronic lower back pain   . Gout   . Anxiety   . Renal disorder     kindey function low; "Metformin was destroying my kidneys" (01/19/2013)  . UTI (urinary tract infection) 08/31/2014  . Swelling of hand 08/31/2014    RT HAND     Past Surgical History  Procedure Laterality Date  . Cesarean section  1983  . Cholecystectomy  ~ 2002  .  Vena cava filter placement  2011?  [facility] Eye surgery Bilateral     "multiple" (01/18/2013)  . Cataract extraction w/ intraocular lens  implant, bilateral Bilateral 2006-2011  . Pars plana vitrectomy Bilateral 2004-2006    "several" (01/18/2013)  . Pars plana repair of retinal deatachment Right   . Refractive surgery Bilateral     "for stigmatism" (01/18/2013)  . Refractive surgery Left ~ 11/2012    "to puff it up cause vision got hazy" (01/18/2013)  .  Colonoscopy N/A 01/21/2013    Procedure: COLONOSCOPY;  Surgeon: Beryle Beams, MD;  Location: [facility];  Service: Endoscopy;  Laterality: N/A;  . Cardiac catheterization N/A 03/13/2015    Procedure: Right/Left Heart Cath and Coronary Angiography;  Surgeon: Adrian Prows, MD;  Location: [facility];  Service: Cardiovascular;  Laterality: N/A;     Family History  Problem Relation Age of Onset  . Cerebral aneurysm Mother   . Hypertension Father   . Cerebral aneurysm Maternal Grandfather   . Cerebral aneurysm Maternal Aunt   . Cancer Maternal Uncle      Social History: Social History   Social History  . Marital Status: Single    Spouse Name: N/A  . Number of Children: N/A  . Years of Education: N/A   Occupational History  . Not on file.   Social History Main Topics  . Smoking status: Former Smoker -- 0.05 packs/day for 30 years    Types: Cigarettes  . Smokeless tobacco: Never Used     Comment: 01/19/2013 "quit smoking cigarettes in the early '90's"  . Alcohol Use: Yes     Comment: 01/19/2013 "drank years ago; last drink 03/2012"  . Drug Use: No  . Sexual Activity: No   Other Topics Concern  . Not on file   Social History Narrative     Prescriptions prior to admission  Medication Sig Dispense Refill Last Dose  . allopurinol (ZYLOPRIM) 300 MG tablet Take 300 mg by mouth daily.   09/11/2015 at Unknown time  . aspirin EC 81 MG tablet Take 81 mg by mouth daily.   09/11/2015 at Unknown time  . calcium-vitamin D 250-100 MG-UNIT per tablet Take 2 tablets by mouth 2 (two) times daily.   09/11/2015 at Unknown time  . cholecalciferol (VITAMIN D) 1000 UNITS tablet Take 2,000 Units by mouth daily.   09/11/2015 at Unknown time  . citalopram (CELEXA) 20 MG tablet Take 20 mg by mouth at bedtime.   09/10/2015  . ferrous gluconate (FERGON) 324 MG tablet Take 1 tablet (324 mg total) by mouth 2 (two) times daily with a meal. 60 tablet 3 09/11/2015 at Unknown time  . furosemide (LASIX) 40 MG  tablet Take 40 mg by mouth daily.   09/11/2015 at Unknown time  . HYDROcodone-acetaminophen (NORCO/VICODIN) 5-325 MG per tablet Take 1-2 tablets by mouth every 6 (six) hours as needed for severe pain. 30 tablet 0 3-4 days  . insulin aspart (NOVOLOG) 100 UNIT/ML injection Inject 2 Units into the skin 3 (three) times daily with meals. (Patient taking differently: Inject 10 Units into the skin 3 (three) times daily with meals. ) 10 mL 11 09/11/2015 at Unknown time  . insulin glargine (LANTUS) 100 unit/mL SOPN Inject 24-26 Units into the skin every morning.    09/11/2015 at Unknown time  . isosorbide mononitrate (IMDUR) 30 MG 24 hr tablet Take 1 tablet (30 mg total) by mouth daily. 30 tablet 3 09/11/2015 at Unknown time  . levothyroxine (SYNTHROID, LEVOTHROID)  50 MCG tablet Take 50 mcg by mouth daily at 12 noon.   09/11/2015 at Unknown time  . MELATONIN PO Take 0.25 mg by mouth at bedtime.   09/10/2015  . metoprolol (LOPRESSOR) 25 MG tablet Take 1 tablet (25 mg total) by mouth 2 (two) times daily.   09/11/2015 at 1100 am   . Multiple Vitamin (MULITIVITAMIN WITH MINERALS) TABS Take 1 tablet by mouth daily.   09/11/2015 at Unknown time  . olmesartan (BENICAR) 40 MG tablet Take 40 mg by mouth daily.   09/11/2015 at Unknown time  . Omega-3 Fatty Acids (FISH OIL) 1200 MG CAPS Take 1 capsule by mouth 2 (two) times daily.   09/11/2015 at Unknown time  . warfarin (COUMADIN) 2.5 MG tablet TAKE 7.5 MG (THREE TABLETS) BY MOUTH DAILY EXCEPT TAKE 5 MG (TWO TABLETS) ON MONDAY, WEDNESDAY, FRIDAY OR AS DIRECTED (Patient taking differently: Take 7.5 mg on Mon / Wed / Frid. Take 5mg  Sun / Tues Raynelle Dick / Sat) 120 tablet 0 09/11/2015 at Unknown time     ROS: General: no fevers/chills/night sweats Eyes: no blurry vision, diplopia, or amaurosis ENT: no sore throat or hearing loss Resp: Reports shortness of breath; no cough, wheezing, or hemoptysis CV: Reports chest pain and chronic edema; no PND, orthopnea, or palpitations GI: no abdominal  pain, nausea, vomiting, diarrhea, or constipation GU: no dysuria, frequency, or hematuria Skin: no rash Neuro: no headache, numbness, tingling, or weakness of extremities Musculoskeletal: no joint pain or swelling Heme: no bleeding, DVT, or easy bruising Endo: no polydipsia or polyuria   Physical Exam: Blood pressure 151/42, pulse 52, temperature 99.6 F (37.6 C), temperature source Oral, resp. rate 18, height 5\' 3"  (1.6 m), weight 126.281 kg (278 lb 6.4 oz), SpO2 100 %.   General appearance: alert, cooperative, appears older than stated age, no distress and morbidly obese Neck: no adenopathy, no carotid bruit, no JVD, supple, symmetrical, trachea midline and thyroid not enlarged, symmetric, no tenderness/mass/nodules Lungs: clear to auscultation bilaterally Chest wall: no tenderness Heart: regular rate and rhythm, S1, S2 normal, no murmur, click, rub or gallop and III/IV early systolic murmur at apex with radiation to RUSB Extremities: edema 1+ bilaterally (currently wrapped with ACE wrap) and venous stasis dermatitis noted Pulses: pedal pulses normal bilaterally, all other pulses difficult to feel due to obesity Skin: venous stasis dermatitis on bilateral lower legs as documented above, otherwise normal  Labs:   Lab Results  Component Value Date   WBC 7.4 09/14/2015   HGB 9.7* 09/14/2015   HCT 32.1* 09/14/2015   MCV 99.4 09/14/2015   PLT 132* 09/14/2015    Recent Labs Lab 09/13/15 0225 09/14/15 0239  NA 135 139  K 4.2 4.1  CL 98* 102  CO2 32 30  BUN 35* 43*  CREATININE 1.38* 1.59*  CALCIUM 8.7* 8.3*  PROT 5.8*  --   BILITOT 1.2  --   ALKPHOS 62  --   ALT 27  --   AST 26  --   GLUCOSE 189* 88    Lipid Panel     Component Value Date/Time   CHOL 120 05/04/2012 0200   TRIG 85 05/04/2012 0200   HDL 47 05/04/2012 0200   CHOLHDL 2.6 05/04/2012 0200   VLDL 17 05/04/2012 0200   LDLCALC 56 05/04/2012 0200    BNP (last 3 results)  Recent Labs  09/12/15 0321   BNP 578.2*    HEMOGLOBIN A1C Lab Results  Component Value Date   HGBA1C 5.2  09/12/2015   MPG 103 09/12/2015    Cardiac Panel (last 3 results)  Recent Labs  09/14/15 0821  TROPONINI 0.06*    Lab Results  Component Value Date   CKTOTAL 106 08/14/2011   CKMB 3.0 08/14/2011   TROPONINI 0.06* 09/14/2015     TSH No results for input(s): TSH in the last 8760 hours.  EKG 09/14/2015: sinus bradycardia with 1st degree AV block at a rate of 56 bpm, LAD, LAFB, LBBB. No further analysis due to LBBB. No significant change from prior EKG.  Echocardiogram 09/13/2015:  Severe focal basal and moderate concentric LVH.  LVEF 55-60%.  No regional wall motion abnormalities.  Grade 3 diastolic dysfunction.  Moderate to severely calcified aortic valve annulus with mild regurgitation.  Severely calcified mitral valve annulus with mild regurgitation.  Left and right atria severely dilated.  RV moderately dilated with mild to moderately reduced systolic function.  Probable medium sized PFO.  Mild TR, mild PR.  PAS P 47 mmHg.  Left and right heart catheterization 03/13/2015:  No significant coronary artery disease by coronary angiography, circumflex coronary artery and LAD have separate ostia and has mild diffuse disease. Right coronary artery is dominant and appears normal. Moderate to severe mitral and left calcification. Normal LV systolic function.  PA pressure 54/22 with a mean of 39 mmHg. After intraoperative pulmonary arterial nitroglycerin, PA pressure reduced to 41/13 with a mean of 27 mmHg. Elevated cardiac output and cardiac index. Cardiac output 8.95, cardiac index 4.09 probably due to morbid obesity. No evidence of intracardiac shunting.   Radiology: Dg Chest 2 View  09/13/2015  CLINICAL DATA:  Shortness of breath and COPD. EXAM: CHEST  2 VIEW COMPARISON:  09/12/2015 FINDINGS: Stable mild cardiac enlargement. Relatively stable mild edema. No focal airspace consolidation or pleural fluid  identified. IMPRESSION: Stable mild pulmonary edema. Electronically Signed   By: Aletta Edouard M.D.   On: 09/13/2015 08:23   Ct Chest Wo Contrast  09/14/2015  CLINICAL DATA:  Fever and cough. EXAM: CT CHEST WITHOUT CONTRAST TECHNIQUE: Multidetector CT imaging of the chest was performed following the standard protocol without IV contrast. COMPARISON:  None. FINDINGS: Mediastinum/Lymph Nodes: Moderate cardiac enlargement. There is aortic atherosclerosis. Calcification in the LAD Coronary artery noted. Mitral valve calcification identified. No mediastinal or hilar adenopathy. The main pulmonary artery appears increased in diameter measuring 4.9 cm. The right and left pulmonary arteries appear prominent. Findings are suggestive of PA hypertension. The trachea appears patent and is midline. Unremarkable appearance of the esophagus. Lungs/Pleura: There is a mosaic attenuation pattern identified in both lungs. No airspace consolidation identified. No interstitial edema or atelectasis. Perifissural nodule within the right middle lobe measures 3 mm and is new from previous exam. Upper abdomen: No acute findings.  No acute findings. Musculoskeletal: Degenerative disc disease is noted throughout the thoracic spine. No aggressive lytic or sclerotic bone lesions. IMPRESSION: 1. Increase caliber of the main pulmonary artery. Correlate for any clinical signs or symptoms of pulmonary arterial hypertension. 2. There is a mosaic attenuation pattern identified within both lungs. This is a nonspecific finding but may be seen with small airways disease as well as chronic pulmonary emboli. 3. 3 mm right middle lobe perifissural nodule peer No follow-up needed if patient is low-risk. Non-contrast chest CT can be considered in 12 months if patient is high-risk. This recommendation follows the consensus statement: Guidelines for Management of Incidental Pulmonary Nodules Detected on CT Images:From the Fleischner Society 2017; published  online before print (10.1148/radiol.SG:5268862).  4. Thoracic degenerative disc disease. Electronically Signed   By: Kerby Moors M.D.   On: 09/14/2015 10:23    Scheduled Meds: . allopurinol  300 mg Oral Daily  . aspirin EC  81 mg Oral Daily  . citalopram  20 mg Oral QHS  . doxycycline  100 mg Oral Q12H  . furosemide  40 mg Intravenous BID  . insulin aspart  0-9 Units Subcutaneous TID WC  . insulin glargine  24 Units Subcutaneous Daily  . ipratropium-albuterol  3 mL Nebulization BID  . irbesartan  300 mg Oral Daily  . isosorbide mononitrate  30 mg Oral Daily  . levothyroxine  50 mcg Oral QAC breakfast  . sodium chloride flush  3 mL Intravenous Q12H  . warfarin  5 mg Oral ONCE-1800  . Warfarin - Pharmacist Dosing Inpatient   Does not apply q1800   Continuous Infusions:  PRN Meds:.sodium chloride, acetaminophen, ipratropium-albuterol, LORazepam, ondansetron (ZOFRAN) IV, sodium chloride flush  ASSESSMENT AND PLAN:  1. Acute on chronic diastolic heart failure  2. Moderate pulmonary hypertension 3. Essential hypertension with CKD Stage III 4. Hyperlipidemia 5. Type II diabetes mellitus 6. Morbid obesity  7. Chronic anemia 8. 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 9. Paroxysmal atrial flutter (on warfarin for h/o DVTs/PE, INR therapeutic)  Recommendation: Pt presents with acute on chronic diastolic heart failure. Minimal decrease in renal function expected with aggressive diuresis. No significant change in weight since admission, will add Zaroxolyn 5mg  daily for 3 doses. Will also change Imdur to BiDil for RV systolic dysfunction and hypertension. Diet and weight loss again discussed at length.   Neldon Labella, NP-C  09/14/2015, 3:30 PM [facility] Pager: [phone] Office: [phone]

## 2015-09-15 LAB — GLUCOSE, CAPILLARY
GLUCOSE-CAPILLARY: 122 mg/dL — AB (ref 65–99)
Glucose-Capillary: 140 mg/dL — ABNORMAL HIGH (ref 65–99)
Glucose-Capillary: 112 mg/dL — ABNORMAL HIGH (ref 65–99)
Glucose-Capillary: 208 mg/dL — ABNORMAL HIGH (ref 65–99)

## 2015-09-15 LAB — BASIC METABOLIC PANEL
ANION GAP: 8 (ref 5–15)
BUN: 37 mg/dL — AB (ref 6–20)
CALCIUM: 8.7 mg/dL — AB (ref 8.9–10.3)
CO2: 33 mmol/L — ABNORMAL HIGH (ref 22–32)
CREATININE: 1.31 mg/dL — AB (ref 0.44–1.00)
Chloride: 100 mmol/L — ABNORMAL LOW (ref 101–111)
GFR calc Af Amer: 51 mL/min — ABNORMAL LOW (ref >60)
GFR, EST NON AFRICAN AMERICAN: 44 mL/min — AB (ref >60)
GLUCOSE: 116 mg/dL — AB (ref 65–99)
Potassium: 3.4 mmol/L — ABNORMAL LOW (ref 3.5–5.1)
SODIUM: 141 mmol/L (ref 135–145)

## 2015-09-15 LAB — CBC
HCT: 33.8 % — ABNORMAL LOW (ref 36.0–46.0)
Hemoglobin: 10.3 g/dL — ABNORMAL LOW (ref 12.0–15.0)
MCH: 30.3 pg (ref 26.0–34.0)
MCHC: 30.5 g/dL (ref 30.0–36.0)
MCV: 99.4 fL (ref 78.0–100.0)
PLATELETS: 135 10*3/uL — AB (ref 150–400)
RBC: 3.4 MIL/uL — AB (ref 3.87–5.11)
RDW: 16.3 % — AB (ref 11.5–15.5)
WBC: 4.8 10*3/uL (ref 4.0–10.5)

## 2015-09-15 LAB — PROTIME-INR
INR: 2.29 — AB (ref 0.00–1.49)
PROTHROMBIN TIME: 25 s — AB (ref 11.6–15.2)

## 2015-09-15 MED ORDER — POTASSIUM CHLORIDE CRYS ER 20 MEQ PO TBCR
20.0000 meq | EXTENDED_RELEASE_TABLET | Freq: Two times a day (BID) | ORAL | Status: DC
  Administered 2015-09-15 – 2015-09-18 (×7): 20 meq via ORAL
  Filled 2015-09-15 (×7): qty 1

## 2015-09-15 MED ORDER — WARFARIN SODIUM 5 MG PO TABS
5.0000 mg | ORAL_TABLET | Freq: Once | ORAL | Status: AC
  Administered 2015-09-15: 5 mg via ORAL
  Filled 2015-09-15: qty 1

## 2015-09-15 MED ORDER — TORSEMIDE 20 MG PO TABS
20.0000 mg | ORAL_TABLET | Freq: Two times a day (BID) | ORAL | Status: DC
  Administered 2015-09-15 – 2015-09-18 (×8): 20 mg via ORAL
  Filled 2015-09-15 (×8): qty 1

## 2015-09-15 NOTE — Progress Notes (Signed)
ANTICOAGULATION CONSULT NOTE - Follow Up Consult  Pharmacy Consult for warfarin Indication: atrial fibrillation/ history of PE  Allergies  Allergen Reactions  . Morphine And Related Rash    Patient Measurements: Height: 5\' 3"  (160 cm) Weight: 269 lb 14.4 oz (122.426 kg) (scale a) IBW/kg (Calculated) : 52.4  Vital Signs: Temp: 98.7 F (37.1 C) (06/10 0407) Temp Source: Oral (06/10 0407) BP: 154/45 mmHg (06/10 0407) Pulse Rate: 51 (06/10 0407)  Labs:  Recent Labs  09/13/15 0225 09/14/15 0239 09/14/15 0821 09/14/15 1458 09/14/15 1953 09/15/15 0231  HGB 9.8* 9.7*  --   --   --  10.3*  HCT 32.1* 32.1*  --   --   --  33.8*  PLT 126* 132*  --   --   --  135*  LABPROT 26.1* 30.1*  --   --   --  25.0*  INR 2.42* 2.93*  --   --   --  2.29*  CREATININE 1.38* 1.59*  --   --   --  1.31*  TROPONINI  --   --  0.06* 0.06* 0.07*  --     Estimated Creatinine Clearance: 58.7 mL/min (by C-G formula based on Cr of 1.31).   Medications:  Prescriptions prior to admission  Medication Sig Dispense Refill Last Dose  . allopurinol (ZYLOPRIM) 300 MG tablet Take 300 mg by mouth daily.   09/11/2015 at Unknown time  . aspirin EC 81 MG tablet Take 81 mg by mouth daily.   09/11/2015 at Unknown time  . calcium-vitamin D 250-100 MG-UNIT per tablet Take 2 tablets by mouth 2 (two) times daily.   09/11/2015 at Unknown time  . cholecalciferol (VITAMIN D) 1000 UNITS tablet Take 2,000 Units by mouth daily.   09/11/2015 at Unknown time  . citalopram (CELEXA) 20 MG tablet Take 20 mg by mouth at bedtime.   09/10/2015  . ferrous gluconate (FERGON) 324 MG tablet Take 1 tablet (324 mg total) by mouth 2 (two) times daily with a meal. 60 tablet 3 09/11/2015 at Unknown time  . furosemide (LASIX) 40 MG tablet Take 40 mg by mouth daily.   09/11/2015 at Unknown time  . HYDROcodone-acetaminophen (NORCO/VICODIN) 5-325 MG per tablet Take 1-2 tablets by mouth every 6 (six) hours as needed for severe pain. 30 tablet 0 3-4 days  .  insulin aspart (NOVOLOG) 100 UNIT/ML injection Inject 2 Units into the skin 3 (three) times daily with meals. (Patient taking differently: Inject 10 Units into the skin 3 (three) times daily with meals. ) 10 mL 11 09/11/2015 at Unknown time  . insulin glargine (LANTUS) 100 unit/mL SOPN Inject 24-26 Units into the skin every morning.    09/11/2015 at Unknown time  . isosorbide mononitrate (IMDUR) 30 MG 24 hr tablet Take 1 tablet (30 mg total) by mouth daily. 30 tablet 3 09/11/2015 at Unknown time  . levothyroxine (SYNTHROID, LEVOTHROID) 50 MCG tablet Take 50 mcg by mouth daily at 12 noon.   09/11/2015 at Unknown time  . MELATONIN PO Take 0.25 mg by mouth at bedtime.   09/10/2015  . metoprolol (LOPRESSOR) 25 MG tablet Take 1 tablet (25 mg total) by mouth 2 (two) times daily.   09/11/2015 at 1100 am   . Multiple Vitamin (MULITIVITAMIN WITH MINERALS) TABS Take 1 tablet by mouth daily.   09/11/2015 at Unknown time  . olmesartan (BENICAR) 40 MG tablet Take 40 mg by mouth daily.   09/11/2015 at Unknown time  . Omega-3 Fatty Acids (FISH OIL)  1200 MG CAPS Take 1 capsule by mouth 2 (two) times daily.   09/11/2015 at Unknown time  . warfarin (COUMADIN) 2.5 MG tablet TAKE 7.5 MG (THREE TABLETS) BY MOUTH DAILY EXCEPT TAKE 5 MG (TWO TABLETS) ON MONDAY, WEDNESDAY, FRIDAY OR AS DIRECTED (Patient taking differently: Take 7.5 mg on Mon / Wed / Frid. Take 5mg  Nancy Fetter / [place] Ship Raynelle Dick / Sat) 120 tablet 0 09/11/2015 at Unknown time    Assessment: 60 yo F admitted 09/12/2015 on warfarin PTA for history of PE/Afib. Pharmacy now consulted to dose warfarin.   INR 2.29 (therapeutic), Hgb 10.3 stable, Plt 135. No s/sx of bleeding noted.  PTA warfarin dose: 5 mg daily EXCEPT for 7.5 mg on M/W/F.   Goal of Therapy:  INR 2-3 Monitor platelets by anticoagulation protocol: Yes   Plan:  - Warfarin 5 mg x 1 tonight - Monitor daily INR, CBC and s/sx of bleeding  Dimitri Ped, PharmD. PGY-1 Pharmacy Resident Pager: [phone] 09/15/2015,9:25 AM

## 2015-09-15 NOTE — Progress Notes (Signed)
Subjective:  No new symptoms or concerns this morning.   Objective:  Vital Signs in the last 24 hours: Temp:  [98.1 F (36.7 C)-99.6 F (37.6 C)] 98.7 F (37.1 C) (06/10 0407) Pulse Rate:  [51-59] 51 (06/10 0407) Resp:  [18] 18 (06/10 0407) BP: (134-154)/(42-54) 154/45 mmHg (06/10 0407) SpO2:  [92 %-100 %] 95 % (06/10 0407) Weight:  [122.426 kg (269 lb 14.4 oz)] 122.426 kg (269 lb 14.4 oz) (06/10 0407)  Intake/Output from previous day: 06/09 0701 - 06/10 0700 In: 720 [P.O.:720] Out: 2400 [Urine:2400]  Physical Exam: General appearance: alert, cooperative, appears older than stated age, no distress and morbidly obese Neck: no adenopathy, no carotid bruit, no JVD, supple, symmetrical, trachea midline and thyroid not enlarged, symmetric, no tenderness/mass/nodules Lungs: clear to auscultation bilaterally Chest wall: no tenderness Heart: regular rate and rhythm, S1, S2 normal, no murmur, click, rub or gallop and III/IV early systolic murmur at apex with radiation to RUSB Extremities: edema 1+ bilaterally (currently wrapped with ACE wrap) and venous stasis dermatitis noted Pulses: pedal pulses normal bilaterally, all other pulses difficult to feel due to obesity Skin: venous stasis dermatitis on bilateral lower legs as documented above, otherwise normal  Lab Results: BMP  Recent Labs  09/13/15 0225 09/14/15 0239 09/15/15 0231  NA 135 139 141  K 4.2 4.1 3.4*  CL 98* 102 100*  CO2 32 30 33*  GLUCOSE 189* 88 116*  BUN 35* 43* 37*  CREATININE 1.38* 1.59* 1.31*  CALCIUM 8.7* 8.3* 8.7*  GFRNONAA 41* 34* 44*  GFRAA 47* 40* 51*    CBC  Recent Labs Lab 09/15/15 0231  WBC 4.8  RBC 3.40*  HGB 10.3*  HCT 33.8*  PLT 135*  MCV 99.4  MCH 30.3  MCHC 30.5  RDW 16.3*    HEMOGLOBIN A1C Lab Results  Component Value Date   HGBA1C 5.2 09/12/2015   MPG 103 09/12/2015    Cardiac Panel (last 3 results)  Recent Labs  09/14/15 0821 09/14/15 1458 09/14/15 1953   TROPONINI 0.06* 0.06* 0.07*   BNP (last 3 results)  Recent Labs  09/12/15 0321  BNP 578.2*   BNP    Component Value Date/Time   BNP 578.2* 09/12/2015 0321   TSH No results for input(s): TSH in the last 8760 hours.  CHOLESTEROL No results for input(s): CHOL in the last 8760 hours.  Hepatic Function Panel  Recent Labs  11/16/14 1140 09/13/15 0225  PROT 6.5 5.8*  ALBUMIN 3.5 3.1*  AST 30 26  ALT 29 27  ALKPHOS 66 62  BILITOT 0.62 1.2    Imaging: Ct Chest Wo Contrast  09/14/2015  CLINICAL DATA:  Fever and cough. EXAM: CT CHEST WITHOUT CONTRAST TECHNIQUE: Multidetector CT imaging of the chest was performed following the standard protocol without IV contrast. COMPARISON:  None. FINDINGS: Mediastinum/Lymph Nodes: Moderate cardiac enlargement. There is aortic atherosclerosis. Calcification in the LAD Coronary artery noted. Mitral valve calcification identified. No mediastinal or hilar adenopathy. The main pulmonary artery appears increased in diameter measuring 4.9 cm. The right and left pulmonary arteries appear prominent. Findings are suggestive of PA hypertension. The trachea appears patent and is midline. Unremarkable appearance of the esophagus. Lungs/Pleura: There is a mosaic attenuation pattern identified in both lungs. No airspace consolidation identified. No interstitial edema or atelectasis. Perifissural nodule within the right middle lobe measures 3 mm and is new from previous exam. Upper abdomen: No acute findings.  No acute findings. Musculoskeletal: Degenerative disc disease is noted throughout the  thoracic spine. No aggressive lytic or sclerotic bone lesions. IMPRESSION: 1. Increase caliber of the main pulmonary artery. Correlate for any clinical signs or symptoms of pulmonary arterial hypertension. 2. There is a mosaic attenuation pattern identified within both lungs. This is a nonspecific finding but may be seen with small airways disease as well as chronic pulmonary  emboli. 3. 3 mm right middle lobe perifissural nodule peer No follow-up needed if patient is low-risk. Non-contrast chest CT can be considered in 12 months if patient is high-risk. This recommendation follows the consensus statement: Guidelines for Management of Incidental Pulmonary Nodules Detected on CT Images:From the Fleischner Society 2017; published online before print (10.1148/radiol.IJ:2314499). 4. Thoracic degenerative disc disease. Electronically Signed   By: Kerby Moors M.D.   On: 09/14/2015 10:23    Cardiac Studies: EKG 09/14/2015: sinus bradycardia with 1st degree AV block at a rate of 56 bpm, LAD, LAFB, LBBB. No further analysis due to LBBB. No significant change from prior EKG.  Echocardiogram 09/13/2015:  Severe focal basal and moderate concentric LVH. LVEF 55-60%. No regional wall motion abnormalities. Grade 3 diastolic dysfunction. Moderate to severely calcified aortic valve annulus with mild regurgitation. Severely calcified mitral valve annulus with mild regurgitation. Left and right atria severely dilated. RV moderately dilated with mild to moderately reduced systolic function. Probable medium sized PFO. Mild TR, mild PR. PAS P 47 mmHg.  Left and right heart catheterization 03/13/2015:  No significant coronary artery disease by coronary angiography, circumflex coronary artery and LAD have separate ostia and has mild diffuse disease. Right coronary artery is dominant and appears normal. Moderate to severe mitral and left calcification. Normal LV systolic function.  PA pressure 54/22 with a mean of 39 mmHg. After intraoperative pulmonary arterial nitroglycerin, PA pressure reduced to 41/13 with a mean of 27 mmHg. Elevated cardiac output and cardiac index. Cardiac output 8.95, cardiac index 4.09 probably due to morbid obesity. No evidence of intracardiac shunting.  Assessment/Plan:  1. Acute on chronic diastolic heart failure  2. Moderate pulmonary hypertension 3.  Essential hypertension with CKD Stage III 4. Hyperlipidemia 5. Type II diabetes mellitus 6. Morbid obesity  7. Chronic anemia 8. 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 9. Paroxysmal atrial flutter (on warfarin for h/o DVTs/PE, INR therapeutic)  Recommendation: Has diuresed almost 2L over the past 24 hours. Will d/c IV lasix and change to PO torsemide with potassium supplement. Creatinine returning back to baseline. No further recommendations from a cardiac standpoint and will see her on an outpatient basis in 2-3 weeks.   Rachel Bo, NP-C 09/15/2015, 7:25 AM [facility] Pager: [phone] Office: [phone]

## 2015-09-15 NOTE — Progress Notes (Signed)
Patient ID: [name], female   DOB: Apr 02, 1956, 60 y.o.   MRN: [id number]    PROGRESS NOTE    MIKI KNIESS  [id number] DOB: 01-09-1956 DOA: 09/12/2015  PCP: [name], MD   Brief Narrative:  60 y.o. female with known CAD. AFib, h/o PE on Coumadin, HTN, HLD, DM, COPD but not on home O2, OSA, CKD, presenting to the ED by EMS with progressive shortness of breath and wheezing , left chest pressure 3/10 associated with dry and non productive cough.   Assessment & Plan:  Acute hypoxic respiratory failure likely secondary toacute on chronic diastolic CHF and COPD exacerbation - last ECHO in 2013 with grade II diastolic CHF - ECHO on this admission indicative of worsening diastolic CHF - troponins have remained rather flat since admission,  - weight is 126 kg on admission --> 122 kg this AM - pt is on Lasix 40 mg IV BID and Cr continue trending up from 1.18 --> 1.38 --> 1.59 --> 1.31 - continue to monitor daily weights, strict I/O - appreciate Dr. Irven Shelling help   Acute exacerbation of COPD  - pt still with significant coughing and mostly non productive  - continue BD's scheduled and as needed - empiric doxycycline started on admission, today is day #4/5 - IS while awake   Atrial Fibrillation CHA2DS2-VASc score 7  - On Coumadin for anticoagulation therapy  - BB Metoprolol stopped due to HR In 40's - HR stable this AM  CAD - last cath in 03/2015 With N.O. CAD EKG without evidence of ACS QTC 507. 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   DVT prophylaxis: on Coumadin  Code Status: Full  Family Communication: Patient at bedside, say she is not sure who would I call for an update Disposition Plan: Home once diuresed well   Consultants:   Cardiology   WOC  Procedures:   Echocardiogram 09/13/2015:  Severe focal basal and moderate concentric LVH. LVEF 55-60%. No regional wall motion abnormalities. Grade 3 diastolic dysfunction. Moderate to severely calcified aortic valve annulus with mild regurgitation. Severely calcified mitral valve annulus with mild regurgitation. Left and right atria severely dilated. RV moderately dilated with mild to moderately reduced systolic function. Probable medium sized PFO. Mild TR, mild PR. PAS P 47 mmHg.   Left and right heart catheterization 03/13/2015:  No significant coronary artery disease by coronary angiography, circumflex coronary artery and LAD have separate ostia and has mild diffuse disease. Right coronary artery is dominant and appears normal. Moderate to severe mitral and left calcification. Normal LV systolic  function.  PA pressure 54/22 with a mean of 39 mmHg. After intraoperative pulmonary arterial nitroglycerin, PA pressure reduced to 41/13 with a mean of 27 mmHg. Elevated cardiac output and cardiac index. Cardiac output 8.95, cardiac index 4.09 probably due to morbid obesity. No evidence of intracardiac shunting.  Antimicrobials:   Doxycycline 6/7 -->  Subjective: Pt reports more short of breath, more non productive cough.    Objective: Filed Vitals:   09/14/15 1941 09/15/15 0407 09/15/15 0937 09/15/15 1200  BP: 134/50 154/45 161/57 134/57  Pulse: 59 51 58 60  Temp: 98.9 F (37.2 C) 98.7 F (37.1 C) 98.2 F (36.8 C) 98.2 F (36.8 C)  TempSrc: Oral Oral Oral Oral  Resp: 18 18 16 20   Height:      Weight:  122.426 kg (269 lb 14.4 oz)    SpO2: 100% 95% 94% 95%    Intake/Output Summary (Last 24 hours) at 09/15/15 1519 Last data filed at 09/15/15 1500  Gross per 24 hour  Intake    903 ml  Output   3300 ml  Net  -2397 ml   Filed Weights   09/13/15 0416 09/14/15 0544 09/15/15 0407  Weight: 126.191 kg (278 lb 3.2 oz) 126.281 kg (278 lb 6.4 oz) 122.426 kg (269 lb 14.4 oz)    Examination:  General exam: Appears calm and comfortable  Respiratory system: diminished breath sounds at bases with crackles Cardiovascular system: S1 & S2 heard, mild bradycardia, SEM 5/6. No rubs, gallops or clicks. Distant heart sounds  Gastrointestinal system: Abdomen is nondistended, soft and nontender.  Central nervous system: Alert and oriented. No focal neurological deficits. Extremities: Symmetric 5 x 5 power. LE chronic venous stasis changes with pitting edema +2  Psychiatry: Judgement and insight appear normal. Teary eyed   Data Reviewed: I have personally reviewed following labs and imaging studies  CBC:  Recent Labs Lab 09/12/15 0321 09/13/15 0225 09/14/15 0239 09/15/15 0231  WBC 7.1 9.0 7.4 4.8  HGB 10.1* 9.8* 9.7* 10.3*  HCT 33.1* 32.1* 32.1* 33.8*  MCV 98.2 98.5 99.4 99.4  PLT 128* 126* 132* [id number]*   Basic Metabolic Panel:  Recent Labs Lab 09/12/15 0321 09/13/15 0225 09/14/15 0239 09/15/15 0231  NA 138 135 139 141  K 4.1 4.2 4.1 3.4*  CL 100* 98* 102 100*  CO2 27 32 30 33*  GLUCOSE 124* 189* 88 116*  BUN 26* 35* 43* 37*  CREATININE 1.18* 1.38* 1.59* 1.31*  CALCIUM 9.1 8.7* 8.3* 8.7*   Liver Function Tests:  Recent Labs Lab 09/13/15 0225  AST 26  ALT 27  ALKPHOS 62  BILITOT 1.2   PROT 5.8*  ALBUMIN 3.1*   Coagulation Profile:  Recent Labs Lab 09/12/15 0321 09/13/15 0225 09/14/15 0239 09/15/15 0231  INR 2.46* 2.42* 2.93* 2.29*   CBG:  Recent Labs Lab 09/14/15 1140 09/14/15 1631 09/14/15 2105 09/15/15 0533 09/15/15 1139  GLUCAP 132* 155* 138* 122* 140*   Urine analysis:    Component Value Date/Time   COLORURINE YELLOW 08/31/2014 1158   APPEARANCEUR CLOUDY* 08/31/2014 1158   LABSPEC 1.014 08/31/2014 1158   PHURINE 6.0 08/31/2014 1158   GLUCOSEU 100* 08/31/2014 1158   HGBUR LARGE* 08/31/2014 1158   BILIRUBINUR NEGATIVE 08/31/2014 1158   KETONESUR NEGATIVE 08/31/2014 1158   PROTEINUR >300* 08/31/2014 1158   UROBILINOGEN 0.2 08/31/2014 1158   NITRITE NEGATIVE 08/31/2014 1158   LEUKOCYTESUR LARGE* 08/31/2014 1158   Radiology Studies: Ct Chest Wo Contrast  09/14/2015  CLINICAL DATA:  Fever and  cough. EXAM: CT CHEST WITHOUT CONTRAST TECHNIQUE: Multidetector CT imaging of the chest was performed following the standard protocol without IV contrast. COMPARISON:  None. FINDINGS: Mediastinum/Lymph Nodes: Moderate cardiac enlargement. There is aortic atherosclerosis. Calcification in the LAD Coronary artery noted. Mitral valve calcification identified. No mediastinal or hilar adenopathy. The main pulmonary artery appears increased in diameter measuring 4.9 cm. The right and left pulmonary arteries appear prominent. Findings are suggestive of PA hypertension. The trachea appears patent and is midline. Unremarkable appearance of the esophagus. Lungs/Pleura: There is a mosaic attenuation pattern identified in both lungs. No airspace consolidation identified. No interstitial edema or atelectasis. Perifissural nodule within the right middle lobe measures 3 mm and is new from previous exam. Upper abdomen: No acute findings.  No acute findings. Musculoskeletal: Degenerative disc disease is noted throughout the thoracic spine. No aggressive lytic or sclerotic bone  lesions. IMPRESSION: 1. Increase caliber of the main pulmonary artery. Correlate for any clinical signs or symptoms of pulmonary arterial hypertension. 2. There is a mosaic attenuation pattern identified within both lungs. This is a nonspecific finding but may be seen with small airways disease as well as chronic pulmonary emboli. 3. 3 mm right middle lobe perifissural nodule peer No follow-up needed if patient is low-risk. Non-contrast chest CT can be considered in 12 months if patient is high-risk. This recommendation follows the consensus statement: Guidelines for Management of Incidental Pulmonary Nodules Detected on CT Images:From the Fleischner Society 2017; published online before print (10.1148/radiol.IJ:2314499). 4. Thoracic degenerative disc disease. Electronically Signed   By: Kerby Moors M.D.   On: 09/14/2015 10:23   Scheduled Meds: . allopurinol  300 mg Oral Daily  . citalopram  20 mg Oral QHS  . doxycycline  100 mg Oral Q12H  . insulin aspart  0-9 Units Subcutaneous TID WC  . insulin glargine  24 Units Subcutaneous Daily  . ipratropium-albuterol  3 mL Nebulization BID  . isosorbide-hydrALAZINE  1 tablet Oral TID  . levothyroxine  50 mcg Oral QAC breakfast  . metolazone  5 mg Oral Daily  . potassium chloride  20 mEq Oral BID  . sodium chloride flush  3 mL Intravenous Q12H  . torsemide  20 mg Oral BID WC  . warfarin  5 mg Oral ONCE-1800  . Warfarin - Pharmacist Dosing Inpatient   Does not apply q1800   Continuous Infusions:    LOS: 3 days   Time spent: 20 minutes   Faye Ramsay, MD [facility] Pager [phone]  If 7PM-7AM, please contact night-coverage [email or web address] Password [email or web address] 09/15/2015, 3:19 PM

## 2015-09-16 LAB — CBC
HCT: 35.9 % — ABNORMAL LOW (ref 36.0–46.0)
Hemoglobin: 11 g/dL — ABNORMAL LOW (ref 12.0–15.0)
MCH: 30.1 pg (ref 26.0–34.0)
MCHC: 30.6 g/dL (ref 30.0–36.0)
MCV: 98.1 fL (ref 78.0–100.0)
PLATELETS: 124 10*3/uL — AB (ref 150–400)
RBC: 3.66 MIL/uL — ABNORMAL LOW (ref 3.87–5.11)
RDW: 16.3 % — AB (ref 11.5–15.5)
WBC: 5.5 10*3/uL (ref 4.0–10.5)

## 2015-09-16 LAB — BASIC METABOLIC PANEL
Anion gap: 7 (ref 5–15)
BUN: 35 mg/dL — AB (ref 6–20)
CALCIUM: 9.1 mg/dL (ref 8.9–10.3)
CO2: 34 mmol/L — ABNORMAL HIGH (ref 22–32)
Chloride: 100 mmol/L — ABNORMAL LOW (ref 101–111)
Creatinine, Ser: 1.39 mg/dL — ABNORMAL HIGH (ref 0.44–1.00)
GFR calc Af Amer: 47 mL/min — ABNORMAL LOW (ref >60)
GFR, EST NON AFRICAN AMERICAN: 41 mL/min — AB (ref >60)
GLUCOSE: 110 mg/dL — AB (ref 65–99)
POTASSIUM: 3.5 mmol/L (ref 3.5–5.1)
Sodium: 141 mmol/L (ref 135–145)

## 2015-09-16 LAB — PROTIME-INR
INR: 1.97 — ABNORMAL HIGH (ref 0.00–1.49)
Prothrombin Time: 22.3 seconds — ABNORMAL HIGH (ref 11.6–15.2)

## 2015-09-16 LAB — TROPONIN I
Troponin I: 0.07 ng/mL — ABNORMAL HIGH (ref <0.031)
Troponin I: 0.08 ng/mL — ABNORMAL HIGH (ref <0.031)

## 2015-09-16 LAB — GLUCOSE, CAPILLARY
GLUCOSE-CAPILLARY: 137 mg/dL — AB (ref 65–99)
GLUCOSE-CAPILLARY: 128 mg/dL — AB (ref 65–99)
Glucose-Capillary: 128 mg/dL — ABNORMAL HIGH (ref 65–99)
Glucose-Capillary: 175 mg/dL — ABNORMAL HIGH (ref 65–99)

## 2015-09-16 MED ORDER — WARFARIN SODIUM 7.5 MG PO TABS
7.5000 mg | ORAL_TABLET | Freq: Once | ORAL | Status: AC
  Administered 2015-09-16: 7.5 mg via ORAL
  Filled 2015-09-16: qty 1

## 2015-09-16 NOTE — Progress Notes (Signed)
Patient having chest pain episode  radiating to her left flank also complaining of SOB but O2 100% in  2l via nasal cannula . Charge nurse and Respiratory therapist  notified nebulizer treatment given.  MD paged .Awaiting for MD to call back. EKG stat. Pain medicine and Ativan given. Patient feels better right after. Will continue to monitor frequently.

## 2015-09-16 NOTE — Progress Notes (Signed)
Patient ID: [name], female   DOB: [birth year], 61 y.o.   MRN: [id number]    PROGRESS NOTE    [name]  [id number] DOB: 04/03/1956 DOA: 09/12/2015  PCP: [name], MD   Brief Narrative:  60 y.o. female with known CAD. AFib, h/o PE on Coumadin, HTN, HLD, DM, COPD but not on home O2, OSA, CKD, presenting to the ED by EMS with progressive shortness of breath and wheezing , left chest pressure 3/10 associated with dry and non productive cough.   Assessment & Plan:  Acute hypoxic respiratory failure likely secondary toacute on chronic diastolic CHF and COPD exacerbation - last ECHO in 2013 with grade II diastolic CHF - ECHO on this admission indicative of worsening diastolic CHF - troponins have remained rather flat since admission,  - weight is 126 kg on admission --> 122 kg --> 119 kg this AM - pt is on Lasix 40 mg IV BID and Cr continue trending up from 1.18 --> 1.38 --> 1.59 --> 1.39 - continue to monitor daily weights, strict I/O - appreciate Dr. Irven Shelling help   Acute exacerbation of COPD  - no coughing this AM  - continue BD's scheduled and as needed - empiric doxycycline started on admission, today is day #5/5 - IS while awake   Atrial Fibrillation CHA2DS2-VASc score 7  - On Coumadin for anticoagulation therapy  - BB Metoprolol stopped due to HR In 40's - HR stable this AM  CAD - last cath in 03/2015 With N.O. CAD EKG without evidence of ACS QTC 507. 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  DVT prophylaxis: on Coumadin  Code Status: Full  Family Communication: Patient at bedside, say she is not sure who would I call for an update Disposition Plan: Home in AM  Consultants:   Cardiology   WOC  Procedures:   Echocardiogram 09/13/2015:  Severe focal basal and moderate concentric LVH. LVEF 55-60%. No regional wall motion abnormalities. Grade 3 diastolic dysfunction. Moderate to severely calcified aortic valve annulus with mild regurgitation. Severely calcified mitral valve annulus with mild regurgitation. Left and right atria severely dilated. RV moderately dilated with mild to moderately reduced systolic function. Probable medium sized PFO. Mild TR, mild PR. PAS P 47 mmHg.   Left and right heart catheterization 03/13/2015:  No significant coronary artery disease by coronary angiography, circumflex coronary artery and LAD have separate ostia and has mild diffuse disease. Right coronary artery is dominant and appears normal. Moderate to severe mitral and left calcification. Normal LV systolic function.  PA pressure 54/22 with a mean of  39 mmHg. After intraoperative pulmonary arterial nitroglycerin, PA pressure reduced to 41/13 with a mean of 27 mmHg. Elevated cardiac output and cardiac index. Cardiac output 8.95, cardiac index 4.09 probably due to morbid obesity. No evidence of intracardiac shunting.  Antimicrobials:   Doxycycline 6/7 -->  Subjective: Pt reports feeling better.   Objective: Filed Vitals:   09/15/15 0937 09/15/15 1200 09/15/15 2207 09/16/15 0638  BP:  161/57 134/57 132/54 153/71  Pulse: 58 60 63 67  Temp: 98.2 F (36.8 C) 98.2 F (36.8 C) 98.4 F (36.9 C) 97.9 F (36.6 C)  TempSrc: Oral Oral Oral Oral  Resp: 16 20 18    Height:      Weight:    119.205 kg (262 lb 12.8 oz)  SpO2: 94% 95% 94% 99%    Intake/Output Summary (Last 24 hours) at 09/16/15 1350 Last data filed at 09/16/15 1015  Gross per 24 hour  Intake    243 ml  Output   1200 ml  Net   -957 ml   Filed Weights   09/14/15 0544 09/15/15 0407 09/16/15 [id number]  Weight: 126.281 kg (278 lb 6.4 oz) 122.426 kg (269 lb 14.4 oz) 119.205 kg (262 lb 12.8 oz)    Examination:  General exam: Appears calm and comfortable  Respiratory system: diminished breath sounds at bases with minimal crackles at bases  Cardiovascular system: S1 & S2 heard, mild bradycardia, SEM 5/6. No rubs Gastrointestinal system: Abdomen is nondistended, soft and nontender.  Central nervous system: Alert and oriented. No focal neurological deficits. Extremities: Symmetric 5 x 5 power. LE chronic venous stasis changes with pitting edema +2  Psychiatry: Judgement and insight appear normal. Teary eyed   Data Reviewed: I have personally reviewed following labs and imaging studies  CBC:  Recent Labs Lab 09/12/15 0321 09/13/15 0225 09/14/15 0239 09/15/15 0231 09/16/15 0404  WBC 7.1 9.0 7.4 4.8 5.5  HGB 10.1* 9.8* 9.7* 10.3* 11.0*  HCT 33.1* 32.1* 32.1* 33.8* 35.9*  MCV 98.2 98.5 99.4 99.4 98.1  PLT 128* 126* 132* 135* [id number]*   Basic Metabolic Panel:  Recent Labs Lab 09/12/15 0321 09/13/15 0225 09/14/15 0239 09/15/15 0231 09/16/15 0404  NA 138 135 139 141 141  K 4.1 4.2 4.1 3.4* 3.5  CL 100* 98* 102 100* 100*  CO2 27 32 30 33* 34*  GLUCOSE 124* 189* 88 116* 110*  BUN 26* 35* 43* 37* 35*  CREATININE 1.18* 1.38* 1.59* 1.31* 1.39*  CALCIUM 9.1 8.7* 8.3* 8.7* 9.1   Liver Function Tests:  Recent Labs Lab 09/13/15 0225  AST 26  ALT 27  ALKPHOS 62  BILITOT 1.2  PROT 5.8*  ALBUMIN 3.1*    Coagulation Profile:  Recent Labs Lab 09/12/15 0321 09/13/15 0225 09/14/15 0239 09/15/15 0231 09/16/15 0404  INR 2.46* 2.42* 2.93* 2.29* 1.97*   CBG:  Recent Labs Lab 09/15/15 1139 09/15/15 1554 09/15/15 2205 09/16/15 0635 09/16/15 1125  GLUCAP 140* 112* 208* 128* 137*   Urine analysis:    Component Value Date/Time   COLORURINE YELLOW 08/31/2014 1158   APPEARANCEUR CLOUDY* 08/31/2014 1158   LABSPEC 1.014 08/31/2014 1158   PHURINE 6.0 08/31/2014 1158   GLUCOSEU 100* 08/31/2014 1158   HGBUR LARGE* 08/31/2014 1158   [place] 08/31/2014 1158   KETONESUR NEGATIVE 08/31/2014 1158   PROTEINUR >300* 08/31/2014 1158   UROBILINOGEN 0.2 08/31/2014 1158   NITRITE NEGATIVE 08/31/2014 1158   LEUKOCYTESUR LARGE* 08/31/2014 1158   Radiology Studies: No results found. Scheduled Meds: . allopurinol  300  mg Oral Daily  . citalopram  20 mg Oral QHS  . doxycycline  100 mg Oral Q12H  . insulin aspart  0-9 Units Subcutaneous TID WC  . insulin glargine  24 Units Subcutaneous Daily  . isosorbide-hydrALAZINE  1 tablet Oral TID  . levothyroxine  50 mcg Oral QAC breakfast  . potassium chloride  20 mEq Oral BID  . sodium chloride flush  3 mL Intravenous Q12H  . torsemide  20 mg Oral BID WC  . warfarin  7.5 mg Oral ONCE-1800  . Warfarin - Pharmacist Dosing Inpatient   Does not apply q1800   Continuous Infusions:    LOS: 4 days   Time spent: 20 minutes   Faye Ramsay, MD [facility] Pager [phone]  If 7PM-7AM, please contact night-coverage [email or web address] Password [email or web address] 09/16/2015, 1:50 PM

## 2015-09-16 NOTE — Progress Notes (Signed)
ANTICOAGULATION CONSULT NOTE - Follow Up Consult  Pharmacy Consult for warfarin Indication: atrial fibrillation/ history of PE  Allergies  Allergen Reactions  . Morphine And Related Rash    Patient Measurements: Height: 5\' 3"  (160 cm) Weight: 262 lb 12.8 oz (119.205 kg) (a scale) IBW/kg (Calculated) : 52.4  Vital Signs: Temp: 97.9 F (36.6 C) (06/11 0638) Temp Source: Oral (06/11 [id number]) BP: 153/71 mmHg (06/11 [id number]) Pulse Rate: 67 (06/11 0638)  Labs:  Recent Labs  09/14/15 0239 09/14/15 0821 09/14/15 1458 09/14/15 1953 09/15/15 0231 09/16/15 0404  HGB 9.7*  --   --   --  10.3* 11.0*  HCT 32.1*  --   --   --  33.8* 35.9*  PLT 132*  --   --   --  135* 124*  LABPROT 30.1*  --   --   --  25.0* 22.3*  INR 2.93*  --   --   --  2.29* 1.97*  CREATININE 1.59*  --   --   --  1.31* 1.39*  TROPONINI  --  0.06* 0.06* 0.07*  --   --     Estimated Creatinine Clearance: 54.4 mL/min (by C-G formula based on Cr of 1.39).   Medications:  Prescriptions prior to admission  Medication Sig Dispense Refill Last Dose  . allopurinol (ZYLOPRIM) 300 MG tablet Take 300 mg by mouth daily.   09/11/2015 at Unknown time  . aspirin EC 81 MG tablet Take 81 mg by mouth daily.   09/11/2015 at Unknown time  . calcium-vitamin D 250-100 MG-UNIT per tablet Take 2 tablets by mouth 2 (two) times daily.   09/11/2015 at Unknown time  . cholecalciferol (VITAMIN D) 1000 UNITS tablet Take 2,000 Units by mouth daily.   09/11/2015 at Unknown time  . citalopram (CELEXA) 20 MG tablet Take 20 mg by mouth at bedtime.   09/10/2015  . ferrous gluconate (FERGON) 324 MG tablet Take 1 tablet (324 mg total) by mouth 2 (two) times daily with a meal. 60 tablet 3 09/11/2015 at Unknown time  . furosemide (LASIX) 40 MG tablet Take 40 mg by mouth daily.   09/11/2015 at Unknown time  . HYDROcodone-acetaminophen (NORCO/VICODIN) 5-325 MG per tablet Take 1-2 tablets by mouth every 6 (six) hours as needed for severe pain. 30 tablet 0 3-4 days  .  insulin aspart (NOVOLOG) 100 UNIT/ML injection Inject 2 Units into the skin 3 (three) times daily with meals. (Patient taking differently: Inject 10 Units into the skin 3 (three) times daily with meals. ) 10 mL 11 09/11/2015 at Unknown time  . insulin glargine (LANTUS) 100 unit/mL SOPN Inject 24-26 Units into the skin every morning.    09/11/2015 at Unknown time  . isosorbide mononitrate (IMDUR) 30 MG 24 hr tablet Take 1 tablet (30 mg total) by mouth daily. 30 tablet 3 09/11/2015 at Unknown time  . levothyroxine (SYNTHROID, LEVOTHROID) 50 MCG tablet Take 50 mcg by mouth daily at 12 noon.   09/11/2015 at Unknown time  . MELATONIN PO Take 0.25 mg by mouth at bedtime.   09/10/2015  . metoprolol (LOPRESSOR) 25 MG tablet Take 1 tablet (25 mg total) by mouth 2 (two) times daily.   09/11/2015 at 1100 am   . Multiple Vitamin (MULITIVITAMIN WITH MINERALS) TABS Take 1 tablet by mouth daily.   09/11/2015 at Unknown time  . olmesartan (BENICAR) 40 MG tablet Take 40 mg by mouth daily.   09/11/2015 at Unknown time  . Omega-3 Fatty Acids (FISH OIL)  1200 MG CAPS Take 1 capsule by mouth 2 (two) times daily.   09/11/2015 at Unknown time  . warfarin (COUMADIN) 2.5 MG tablet TAKE 7.5 MG (THREE TABLETS) BY MOUTH DAILY EXCEPT TAKE 5 MG (TWO TABLETS) ON MONDAY, WEDNESDAY, FRIDAY OR AS DIRECTED (Patient taking differently: Take 7.5 mg on Mon / Wed / Frid. Take 5mg  Sun / Beatris Ship Raynelle Dick / Sat) 120 tablet 0 09/11/2015 at Unknown time    Assessment: 60 yo F admitted 09/12/2015 on warfarin PTA for history of PE/Afib. Pharmacy consulted to dose warfarin.   INR 1.97 (slightly subtherapeutic), Hgb 11 stable, Plt 124. No s/sx of bleeding noted.  PTA warfarin dose: 5 mg daily EXCEPT for 7.5 mg on M/W/F.   Goal of Therapy:  INR 2-3 Monitor platelets by anticoagulation protocol: Yes   Plan:  - Warfarin 7.5 mg x 1 tonight - Monitor daily INR, CBC and s/sx of bleeding  Dimitri Ped, PharmD. PGY-1 Pharmacy Resident Pager: [phone] 09/16/2015,9:05  AM

## 2015-09-17 LAB — BASIC METABOLIC PANEL
Anion gap: 10 (ref 5–15)
BUN: 40 mg/dL — AB (ref 6–20)
CHLORIDE: 99 mmol/L — AB (ref 101–111)
CO2: 30 mmol/L (ref 22–32)
CREATININE: 1.7 mg/dL — AB (ref 0.44–1.00)
Calcium: 9 mg/dL (ref 8.9–10.3)
GFR calc Af Amer: 37 mL/min — ABNORMAL LOW (ref >60)
GFR calc non Af Amer: 32 mL/min — ABNORMAL LOW (ref >60)
Glucose, Bld: 119 mg/dL — ABNORMAL HIGH (ref 65–99)
Potassium: 3.9 mmol/L (ref 3.5–5.1)
Sodium: 139 mmol/L (ref 135–145)

## 2015-09-17 LAB — GLUCOSE, CAPILLARY
GLUCOSE-CAPILLARY: 152 mg/dL — AB (ref 65–99)
GLUCOSE-CAPILLARY: 165 mg/dL — AB (ref 65–99)
Glucose-Capillary: 124 mg/dL — ABNORMAL HIGH (ref 65–99)
Glucose-Capillary: 113 mg/dL — ABNORMAL HIGH (ref 65–99)

## 2015-09-17 LAB — PROTIME-INR
INR: 1.87 — ABNORMAL HIGH (ref 0.00–1.49)
Prothrombin Time: 21.4 seconds — ABNORMAL HIGH (ref 11.6–15.2)

## 2015-09-17 LAB — CBC
HEMATOCRIT: 37.4 % (ref 36.0–46.0)
Hemoglobin: 11.5 g/dL — ABNORMAL LOW (ref 12.0–15.0)
MCH: 30.4 pg (ref 26.0–34.0)
MCHC: 30.7 g/dL (ref 30.0–36.0)
MCV: 98.9 fL (ref 78.0–100.0)
Platelets: 141 10*3/uL — ABNORMAL LOW (ref 150–400)
RBC: 3.78 MIL/uL — ABNORMAL LOW (ref 3.87–5.11)
RDW: 16.3 % — AB (ref 11.5–15.5)
WBC: 5.9 10*3/uL (ref 4.0–10.5)

## 2015-09-17 LAB — TROPONIN I: Troponin I: 0.1 ng/mL — ABNORMAL HIGH (ref <0.031)

## 2015-09-17 MED ORDER — WARFARIN SODIUM 7.5 MG PO TABS
7.5000 mg | ORAL_TABLET | Freq: Once | ORAL | Status: AC
  Administered 2015-09-17: 7.5 mg via ORAL
  Filled 2015-09-17: qty 1

## 2015-09-17 NOTE — Progress Notes (Signed)
Patient received total of 3 doses of Zaroxolyn. Would resume Demadex for discharge. We will be happy to see her back in the office. No other specific recommendation from cardiology. She has normal coronary arteries by cardiac catheterization and chest pain clearly atypical. Call if questions.   Adrian Prows, MD 09/17/2015, 9:43 AM [facility]. [place] Pager: [phone] Office: [phone] If no answer: Cell:  [phone]

## 2015-09-17 NOTE — Progress Notes (Signed)
Physical Therapy Treatment Patient Details Name: [name] MRN: [id number] DOB: 03-Mar-1956 Today's Date: 09/17/2015    History of Present Illness Pt is a 60 y.o. female with known CAD. AFib, h/o PE on Coumadin, COPD but not on home O2, OSA, CKD, gout, anxiety, presenting to the ED by EMS with progressive shortness of breath and wheezing , left chest pressure 3/10 associated with dry and non productive cough. Admitting dx: acute hypoxic respiratory failure likely secondary toacute on chronic diastolic CHF and COPD exacerbation.      PT Comments    [name] made excellent progress today. She denied dizziness when OOB and ambulating.  Min instability noted when stepping over or around objects.  Continue to recommend OPPT at d/c for the reason indicated below.  Follow Up Recommendations  Outpatient PT (to address suspected symptomatic suboccipital musculature)     Equipment Recommendations  None recommended by PT    Recommendations for Other Services       Precautions / Restrictions Precautions Precautions: Fall Restrictions Weight Bearing Restrictions: No    Mobility  Bed Mobility Overal bed mobility: Independent                Transfers Overall transfer level: Modified independent Equipment used: None Transfers: Sit to/from Stand Sit to Stand: Modified independent (Device/Increase time)         General transfer comment: No instability noted.  Stood at bedside for ~1 minute to ensure pt not dizzy prior to ambulating.  Ambulation/Gait Ambulation/Gait assistance: Min guard Ambulation Distance (Feet): 100 Feet Assistive device: None Gait Pattern/deviations: Step-through pattern   Gait velocity interpretation: at or above normal speed for age/gender General Gait Details: Greater weight shift to Lt.  Able to tolerate challenges to balance while ambulating as documented below.  SpO2 remains at or above 94%.  HR up to 101.   Stairs            Wheelchair  Mobility    Modified Rankin (Stroke Patients Only)       Balance Overall balance assessment: Needs assistance Sitting-balance support: No upper extremity supported;Feet supported Sitting balance-[name]: Good     Standing balance support: No upper extremity supported;During functional activity Standing balance-[name]: Good               High level balance activites: Sudden stops;Head turns;Turns;Other (comment) (Min instability stepping over obstacle)      Cognition Arousal/Alertness: Awake/alert Behavior During Therapy: WFL for tasks assessed/performed Overall Cognitive Status: Within Functional Limits for tasks assessed                      Exercises General Exercises - Upper Extremity Shoulder Flexion: AROM;Both;10 reps;Seated General Exercises - Lower Extremity Ankle Circles/Pumps: AROM;Both;10 reps;Seated Long Arc Quad: AROM;Both;10 reps;Seated Hip Flexion/Marching: Both;10 reps;Seated    General Comments        Pertinent Vitals/Pain Pain Assessment: No/denies pain Pain Intervention(s): Limited activity within patient's tolerance;Monitored during session    Home Living                      Prior Function            PT Goals (current goals can now be found in the care plan section) Acute Rehab PT Goals Patient Stated Goal: to go home tomorrow PT Goal Formulation: With patient Time For Goal Achievement: 09/28/15 Potential to Achieve Goals: Good Progress towards PT goals: Progressing toward goals    Frequency  Min  3X/week    PT Plan Current plan remains appropriate    Co-evaluation             End of Session Equipment Utilized During Treatment: Gait belt Activity Tolerance: Patient tolerated treatment well Patient left: in bed;with call bell/phone within reach;Other (comment) (sitting EOB)     Time: [id number] PT Time Calculation (min) (ACUTE ONLY): 16 min  Charges:  $Gait Training: 8-22 mins                     G Codes:       [name] PT, DPT  Pager: [phone] Phone: [phone] 09/17/2015, 4:01 PM

## 2015-09-17 NOTE — Care Management Important Message (Signed)
Important Message  Patient Details  Name: [name] MRN: [id number] Date of Birth: [birth year]   Medicare Important Message Given:  Yes    Loann Quill 09/17/2015, 1:46 PM

## 2015-09-17 NOTE — Progress Notes (Signed)
ANTICOAGULATION CONSULT NOTE - Follow Up Consult  Pharmacy Consult for warfarin Indication: atrial fibrillation/ history of PE  Allergies  Allergen Reactions  . Morphine And Related Rash    Patient Measurements: Height: 5\' 3"  (160 cm) Weight: 261 lb 12.8 oz (118.752 kg) (a scale) IBW/kg (Calculated) : 52.4  Vital Signs: Temp: 97.7 F (36.5 C) (06/12 0632) Temp Source: Oral (06/12 [id number]) BP: 133/56 mmHg (06/12 [id number]) Pulse Rate: 62 (06/12 0632)  Labs:  Recent Labs  09/15/15 0231 09/16/15 0404 09/16/15 1519 09/16/15 2126 09/17/15 0215  HGB 10.3* 11.0*  --   --  11.5*  HCT 33.8* 35.9*  --   --  37.4  PLT 135* 124*  --   --  141*  LABPROT 25.0* 22.3*  --   --  21.4*  INR 2.29* 1.97*  --   --  1.87*  CREATININE 1.31* 1.39*  --   --  1.70*  TROPONINI  --   --  0.07* 0.08* 0.10*    Estimated Creatinine Clearance: 44.4 mL/min (by C-G formula based on Cr of 1.7).   Medications:  Prescriptions prior to admission  Medication Sig Dispense Refill Last Dose  . allopurinol (ZYLOPRIM) 300 MG tablet Take 300 mg by mouth daily.   09/11/2015 at Unknown time  . aspirin EC 81 MG tablet Take 81 mg by mouth daily.   09/11/2015 at Unknown time  . calcium-vitamin D 250-100 MG-UNIT per tablet Take 2 tablets by mouth 2 (two) times daily.   09/11/2015 at Unknown time  . cholecalciferol (VITAMIN D) 1000 UNITS tablet Take 2,000 Units by mouth daily.   09/11/2015 at Unknown time  . citalopram (CELEXA) 20 MG tablet Take 20 mg by mouth at bedtime.   09/10/2015  . ferrous gluconate (FERGON) 324 MG tablet Take 1 tablet (324 mg total) by mouth 2 (two) times daily with a meal. 60 tablet 3 09/11/2015 at Unknown time  . furosemide (LASIX) 40 MG tablet Take 40 mg by mouth daily.   09/11/2015 at Unknown time  . HYDROcodone-acetaminophen (NORCO/VICODIN) 5-325 MG per tablet Take 1-2 tablets by mouth every 6 (six) hours as needed for severe pain. 30 tablet 0 3-4 days  . insulin aspart (NOVOLOG) 100 UNIT/ML injection  Inject 2 Units into the skin 3 (three) times daily with meals. (Patient taking differently: Inject 10 Units into the skin 3 (three) times daily with meals. ) 10 mL 11 09/11/2015 at Unknown time  . insulin glargine (LANTUS) 100 unit/mL SOPN Inject 24-26 Units into the skin every morning.    09/11/2015 at Unknown time  . isosorbide mononitrate (IMDUR) 30 MG 24 hr tablet Take 1 tablet (30 mg total) by mouth daily. 30 tablet 3 09/11/2015 at Unknown time  . levothyroxine (SYNTHROID, LEVOTHROID) 50 MCG tablet Take 50 mcg by mouth daily at 12 noon.   09/11/2015 at Unknown time  . MELATONIN PO Take 0.25 mg by mouth at bedtime.   09/10/2015  . metoprolol (LOPRESSOR) 25 MG tablet Take 1 tablet (25 mg total) by mouth 2 (two) times daily.   09/11/2015 at 1100 am   . Multiple Vitamin (MULITIVITAMIN WITH MINERALS) TABS Take 1 tablet by mouth daily.   09/11/2015 at Unknown time  . olmesartan (BENICAR) 40 MG tablet Take 40 mg by mouth daily.   09/11/2015 at Unknown time  . Omega-3 Fatty Acids (FISH OIL) 1200 MG CAPS Take 1 capsule by mouth 2 (two) times daily.   09/11/2015 at Unknown time  . warfarin (COUMADIN) 2.5  MG tablet TAKE 7.5 MG (THREE TABLETS) BY MOUTH DAILY EXCEPT TAKE 5 MG (TWO TABLETS) ON MONDAY, WEDNESDAY, FRIDAY OR AS DIRECTED (Patient taking differently: Take 7.5 mg on Mon / Wed / Frid. Take 5mg  Sun / Beatris Ship Raynelle Dick / Sat) 120 tablet 0 09/11/2015 at Unknown time    Assessment: 60 yo F admitted 09/12/2015 on warfarin PTA for history of PE/Afib. Pharmacy consulted to dose warfarin.   INR 1.87 (trending down), Hgb 11.5 stable, Plt 141. No s/sx of bleeding noted.  PTA warfarin dose: 5 mg daily EXCEPT for 7.5 mg on M/W/F.   Goal of Therapy:  INR 2-3 Monitor platelets by anticoagulation protocol: Yes   Plan:  - Warfarin 7.5 mg x 1 tonight - Monitor daily INR, CBC and s/sx of bleeding  [employer], Pharm.D., BCPS Clinical Pharmacist Pager [phone] 09/17/2015 9:53 AM

## 2015-09-17 NOTE — Discharge Summary (Signed)
Physician Discharge Summary  [name] [id number] DOB: 1955-08-13 DOA: 09/12/2015  PCP: [name], MD  Admit date: 09/12/2015 Discharge date: 09/17/2015  Recommendations for Outpatient Follow-up:  1. Pt will need to follow up with PCP in 1-2 weeks post discharge 2. Please obtain BMP to evaluate electrolytes and kidney function 3. Please also check CBC to evaluate Hg and Hct levels  Discharge Diagnoses:  Principal Problem:   CHF (congestive heart failure) (HCC) Active Problems:   CKD (chronic kidney disease) stage 3, GFR 30-59 ml/min   Decreased mobility   COPD exacerbation (HCC)  Discharge Condition: Stable  Diet recommendation: Heart healthy diet discussed in details   Brief Narrative:  60 y.o. female with known CAD. AFib, h/o PE on Coumadin, HTN, HLD, DM, COPD but not on home O2, OSA, CKD, presenting to the ED by EMS with progressive shortness of breath and wheezing , left chest pressure 3/10 associated with dry and non productive cough.   Assessment & Plan:  Acute hypoxic respiratory failure likely secondary toacute on chronic diastolic CHF and COPD exacerbation - last ECHO in 2013 with grade II diastolic CHF - ECHO on this admission indicative of worsening diastolic CHF - troponins have remained rather flat since admission,  - weight is 126 kg on admission --> 122 kg --> 119 kg --> 118 kg this AM - pt is on Torsemide 20 mg PO BID and Cr continue trending up from 1.18 --> 1.38 --> 1.59 --> 1.39 --> 1.70 - continue to monitor daily weights, strict I/O - appreciate Dr. Irven Shelling help  - pt will follow up with Dr. Einar Gip in an outpatient setting   Acute exacerbation of COPD  - no coughing this AM  - continue BD's scheduled and as needed - empiric doxycycline completed  - IS while awake   Atrial Fibrillation CHA2DS2-VASc score 7  - On Coumadin for anticoagulation therapy  - BB Metoprolol stopped due to HR In 40's - HR stable this AM  CAD - last cath  in 03/2015 With N.O. CAD EKG without evidence of ACS QTC 507. 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  DVT prophylaxis: on Coumadin  Code Status: Full  Family Communication: Patient at bedside, say she is not sure who would I call for an update, she does not want to go home today but wants to wait till tomorrow  Disposition Plan: Home in AM  Consultants:   Cardiology   WOC  Procedures:   Echocardiogram 09/13/2015:  Severe focal basal and moderate concentric LVH. LVEF 55-60%. No regional wall motion abnormalities. Grade 3 diastolic dysfunction. Moderate to severely calcified aortic valve annulus  with mild regurgitation. Severely calcified mitral valve annulus with mild regurgitation. Left and right atria severely dilated. RV moderately dilated with mild to moderately reduced systolic function. Probable medium sized PFO. Mild TR, mild PR. PAS P 47 mmHg.   Left and right heart catheterization 03/13/2015:  No significant coronary artery disease by coronary angiography, circumflex coronary artery and LAD have separate ostia and has  mild diffuse disease. Right coronary artery is dominant and appears normal. Moderate to severe mitral and left calcification. Normal LV systolic function.  PA pressure 54/22 with a mean of 39 mmHg. After intraoperative pulmonary arterial nitroglycerin, PA pressure reduced to 41/13 with a mean of 27 mmHg. Elevated cardiac output and cardiac index. Cardiac output 8.95, cardiac index 4.09 probably due to morbid obesity. No evidence of intracardiac shunting.  Antimicrobials:  Doxycycline 6/7 -->Discharge Exam: Filed Vitals:   09/17/15 1207 09/17/15 1208  BP: 96/45 108/52  Pulse: 78   Temp: 97.8 F (36.6 C)   Resp: 18    Filed Vitals:   09/16/15 1300 09/17/15 0632 09/17/15 1207 09/17/15 1208  BP: 116/42 133/56 96/45 108/52  Pulse: 78 62 78   Temp: 98.3 F (36.8 C) 97.7 F (36.5 C) 97.8 F (36.6 C)   TempSrc: Oral Oral Oral   Resp: 18 20 18    Height:      Weight:  118.752 kg (261 lb 12.8 oz)    SpO2: 95% 98% 96%     General: Pt is alert, follows commands appropriately, not in acute distress, morbidly obese  Cardiovascular: Regular rate and rhythm, no rubs, no gallops Respiratory: diminished breath sounds at bases  Abdominal: Soft, non tender, non distended, bowel sounds +, no guarding Extremities: no cyanosis, pulses palpable bilaterally DP and PT Neuro: Grossly nonfocal  Discharge Instructions     Medication List    TAKE these medications        allopurinol 300 MG tablet  Commonly known as:  ZYLOPRIM  Take 300 mg by mouth daily.     aspirin EC 81 MG tablet  Take 81 mg by mouth daily.     calcium-vitamin D 250-100 MG-UNIT tablet  Take 2 tablets by mouth 2 (two) times daily.     cholecalciferol 1000 units tablet  Commonly known as:  VITAMIN D  Take 2,000 Units by mouth daily.     citalopram 20 MG tablet  Commonly known as:  CELEXA  Take 20 mg by mouth at bedtime.     ferrous gluconate 324 MG tablet  Commonly known as:  FERGON  Take 1 tablet (324 mg total) by  mouth 2 (two) times daily with a meal.     Fish Oil 1200 MG Caps  Take 1 capsule by mouth 2 (two) times daily.     HYDROcodone-acetaminophen 5-325 MG tablet  Commonly known as:  NORCO/VICODIN  Take 1-2 tablets by mouth every 6 (six) hours as needed for severe pain.     insulin aspart 100 UNIT/ML injection  Commonly known as:  novoLOG  Inject 2 Units into the skin 3 (three) times daily with meals.     insulin glargine 100 unit/mL Sopn  Commonly known as:  LANTUS  Inject 24-26 Units into the skin every morning.     levothyroxine 50 MCG tablet  Commonly known as:  SYNTHROID, LEVOTHROID  Take 50 mcg by mouth daily at 12 noon.     MELATONIN PO  Take 0.25 mg by mouth at bedtime.     multivitamin with minerals Tabs  tablet  Take 1 tablet by mouth daily.     warfarin 2.5 MG tablet  Commonly known as:  COUMADIN  TAKE 7.5 MG (THREE TABLETS) BY MOUTH DAILY EXCEPT TAKE 5 MG (TWO TABLETS) ON MONDAY, WEDNESDAY, FRIDAY OR AS DIRECTED      ASK your doctor about these medications        furosemide 40 MG tablet  Commonly known as:  LASIX  Take 40 mg by mouth daily.     isosorbide mononitrate 30 MG 24 hr tablet  Commonly known as:  IMDUR  Take 1 tablet (30 mg total) by mouth daily.     metoprolol tartrate 25 MG tablet  Commonly known as:  LOPRESSOR  Take 1 tablet (25 mg total) by mouth 2 (two) times daily.     olmesartan 40 MG tablet  Commonly known as:  BENICAR  Take 40 mg by mouth daily.           Follow-up Information    Follow up with [name], MD.   Specialty:  Internal Medicine   Contact information:   [id number] [place] [address] [phone]        The results of significant diagnostics from this hospitalization (including imaging, microbiology, ancillary and laboratory) are listed below for reference.     Microbiology: No results found for this or any previous visit (from the past 240 hour(s)).   Labs: Basic Metabolic Panel:  Recent Labs Lab  09/13/15 0225 09/14/15 0239 09/15/15 0231 09/16/15 0404 09/17/15 0215  NA 135 139 141 141 139  K 4.2 4.1 3.4* 3.5 3.9  CL 98* 102 100* 100* 99*  CO2 32 30 33* 34* 30  GLUCOSE 189* 88 116* 110* 119*  BUN 35* 43* 37* 35* 40*  CREATININE 1.38* 1.59* 1.31* 1.39* 1.70*  CALCIUM 8.7* 8.3* 8.7* 9.1 9.0   Liver Function Tests:  Recent Labs Lab 09/13/15 0225  AST 26  ALT 27  ALKPHOS 62  BILITOT 1.2  PROT 5.8*  ALBUMIN 3.1*   CBC:  Recent Labs Lab 09/13/15 0225 09/14/15 0239 09/15/15 0231 09/16/15 0404 09/17/15 0215  WBC 9.0 7.4 4.8 5.5 5.9  HGB 9.8* 9.7* 10.3* 11.0* 11.5*  HCT 32.1* 32.1* 33.8* 35.9* 37.4  MCV 98.5 99.4 99.4 98.1 98.9  PLT 126* 132* 135* 124* 141*   Cardiac Enzymes:  Recent Labs Lab 09/14/15 1458 09/14/15 1953 09/16/15 1519 09/16/15 2126 09/17/15 0215  TROPONINI 0.06* 0.07* 0.07* 0.08* 0.10*   BNP: BNP (last 3 results)  Recent Labs  09/12/15 0321  BNP 578.2*   CBG:  Recent Labs Lab 09/16/15 1125 09/16/15 1645 09/16/15 2139 09/17/15 0629 09/17/15 1205  GLUCAP 137* 128* 175* 124* 152*   SIGNED: Time coordinating discharge:  30 minutes  MAGICK-Kasumi Ditullio, MD  [facility] 09/17/2015, 12:18 PM Pager [phone]  If 7PM-7AM, please contact night-coverage [email or web address] Password [email or web address]

## 2015-09-18 LAB — CBC
HEMATOCRIT: 37.8 % (ref 36.0–46.0)
Hemoglobin: 11.5 g/dL — ABNORMAL LOW (ref 12.0–15.0)
MCH: 29.6 pg (ref 26.0–34.0)
MCHC: 30.4 g/dL (ref 30.0–36.0)
MCV: 97.4 fL (ref 78.0–100.0)
PLATELETS: 165 10*3/uL (ref 150–400)
RBC: 3.88 MIL/uL (ref 3.87–5.11)
RDW: 16.2 % — AB (ref 11.5–15.5)
WBC: 6.2 10*3/uL (ref 4.0–10.5)

## 2015-09-18 LAB — PROTIME-INR
INR: 1.9 — ABNORMAL HIGH (ref 0.00–1.49)
Prothrombin Time: 21.7 seconds — ABNORMAL HIGH (ref 11.6–15.2)

## 2015-09-18 LAB — BASIC METABOLIC PANEL
ANION GAP: 10 (ref 5–15)
BUN: 43 mg/dL — AB (ref 6–20)
CO2: 29 mmol/L (ref 22–32)
Calcium: 9 mg/dL (ref 8.9–10.3)
Chloride: 98 mmol/L — ABNORMAL LOW (ref 101–111)
Creatinine, Ser: 1.84 mg/dL — ABNORMAL HIGH (ref 0.44–1.00)
GFR calc Af Amer: 34 mL/min — ABNORMAL LOW (ref >60)
GFR, EST NON AFRICAN AMERICAN: 29 mL/min — AB (ref >60)
GLUCOSE: 125 mg/dL — AB (ref 65–99)
POTASSIUM: 4.1 mmol/L (ref 3.5–5.1)
Sodium: 137 mmol/L (ref 135–145)

## 2015-09-18 LAB — GLUCOSE, CAPILLARY
Glucose-Capillary: 159 mg/dL — ABNORMAL HIGH (ref 65–99)
Glucose-Capillary: 133 mg/dL — ABNORMAL HIGH (ref 65–99)

## 2015-09-18 MED ORDER — ISOSORB DINITRATE-HYDRALAZINE 20-37.5 MG PO TABS: 1.0000 | ORAL_TABLET | Freq: Three times a day (TID) | ORAL | Status: DC

## 2015-09-18 MED ORDER — WARFARIN SODIUM 7.5 MG PO TABS: 7.5000 mg | ORAL_TABLET | Freq: Once | ORAL | Status: DC

## 2015-09-18 MED ORDER — TORSEMIDE 10 MG PO TABS: 10.0000 mg | ORAL_TABLET | Freq: Two times a day (BID) | ORAL | Status: DC

## 2015-09-18 NOTE — Discharge Summary (Signed)
Physician Discharge Summary  [name] [id number] DOB: 10-12-1955 DOA: 09/12/2015  PCP: [name], MD  Admit date: 09/12/2015 Discharge date: 09/18/2015  Recommendations for Outpatient Follow-up:  1. Pt will need to follow up with PCP in 1 week post discharge 2. Please obtain BMP to evaluate electrolytes and kidney function 3. Please also check CBC to evaluate Hg and Hct levels 4. Please note that Benicar was stopped until renal function stabilizes 5. Please note that per cardiologist, Lasix stopped and pt started on Torsemide and has responded better, down almost 10 lbs during the hospital stay 6. As Cr was trending up, dose of Torsemide changed from 20 mg BID PO to 10 mg BID PO, pt advised to check weight daily and to see her cardiologist on follow up within next 1-2 weeks 7. Please also note that Imdur was stopped and pt was placed on Bidil instead, per cardiology team recommendations  8. Metoprolol was topped due to bradycardia and HR in 40's 9. Weight on discharge 262 lbs   Discharge Diagnoses:  Principal Problem:   CHF (congestive heart failure) (HCC) Active Problems:   CKD (chronic kidney disease) stage 3, GFR 30-59 ml/min   Decreased mobility   COPD exacerbation (HCC)  Discharge Condition: Stable  Diet recommendation: Heart healthy diet discussed in details   Brief Narrative:  60 y.o. female with known CAD. AFib, h/o PE on Coumadin, HTN, HLD, DM, COPD but not on home O2, OSA, CKD, presenting to the ED by EMS with progressive shortness of breath and wheezing , left chest pressure 3/10 associated with dry and non productive cough.   Assessment & Plan:  Acute hypoxic respiratory failure likely secondary toacute on chronic diastolic CHF and COPD exacerbation - last ECHO in 2013 with grade II diastolic CHF - ECHO on this admission indicative of worsening diastolic CHF - troponins have remained rather flat since admission,  - weight is 126 kg on admission --> 122  kg --> 119 kg --> 118 kg this AM, unchanged in the past 24 hours  - pt is on Torsemide 20 mg PO BID and Cr continue trending up from 1.18 --> 1.38 --> 1.59 --> 1.39 --> 1.70 --> 1.84 - continue to monitor daily weights, pt advised and verbalized understanding  - appreciate Dr. Irven Shelling help  - pt will follow up with Dr. Einar Gip in an outpatient setting   Acute exacerbation of COPD  - no coughing this AM  - continue BD's scheduled and as needed - empiric doxycycline completed, no need for outpatient ABX  Atrial Fibrillation CHA2DS2-VASc score 7  - On Coumadin for anticoagulation therapy  - BB Metoprolol stopped due to HR In 40's - HR stable this AM off BB  CAD - last cath in 03/2015 With N.O. CAD EKG without evidence of ACS QTC 507.  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  DVT prophylaxis: on Coumadin  Code Status: Full  Family Communication: Patient at bedside, say has declined my offer to update any family member  Disposition Plan: Home in AM  Consultants:   Cardiology   WOC  Procedures:   Echocardiogram 09/13/2015:  Severe focal basal and moderate concentric LVH. LVEF 55-60%. No  regional wall motion abnormalities. Grade 3 diastolic dysfunction. Moderate to severely calcified aortic valve annulus with mild regurgitation. Severely calcified mitral valve annulus with mild regurgitation. Left and right atria severely dilated. RV moderately dilated with mild to moderately reduced systolic function. Probable medium sized PFO. Mild TR, mild PR. PAS P 47 mmHg.   Left and right heart catheterization 03/13/2015:  No significant coronary artery disease by coronary angiography, circumflex coronary artery and LAD have separate ostia and has mild diffuse disease. Right coronary artery is dominant and appears normal. Moderate to severe mitral and left calcification. Normal LV systolic function.  PA pressure 54/22 with a mean of 39 mmHg. After intraoperative pulmonary arterial nitroglycerin, PA pressure reduced to 41/13 with a mean of 27 mmHg. Elevated cardiac output and cardiac index. Cardiac output 8.95, cardiac index 4.09 probably due to morbid obesity. No evidence of intracardiac shunting.   Discharge Exam: Filed Vitals:   09/18/15 0605 09/18/15 1006  BP: 143/56 144/67  Pulse: 71 70  Temp: 98.1 F (36.7 C) 97.9 F (36.6 C)  Resp: 18 16   Filed Vitals:   09/17/15 1208 09/17/15 2126 09/18/15 0605 09/18/15 1006  BP: 108/52 115/48 143/56 144/67  Pulse:  74 71 70  Temp:  98.3 F (36.8 C) 98.1 F (36.7 C) 97.9 F (36.6 C)  TempSrc:  Oral Oral Oral  Resp:  18 18 16   Height:      Weight:   118.842 kg (262 lb)   SpO2:  99% 96% 100%    General: Pt is alert, follows commands appropriately, not in acute distress, morbidly obese  Cardiovascular: Regular rate and rhythm, no rubs, no gallops, +1 bilateral LE edema with chronic venous stasis changes  Respiratory: diminished breath sounds at bases  Abdominal: Soft, non tender, non distended, bowel sounds +, no guarding Extremities: no cyanosis, pulses palpable bilaterally DP and PT Neuro: Grossly nonfocal  Discharge  Instructions      Discharge Instructions    Diet - low sodium heart healthy    Complete by:  As directed      Increase activity slowly    Complete by:  As directed             Medication List    STOP taking these medications        furosemide 40 MG tablet  Commonly known as:  LASIX     isosorbide mononitrate 30 MG 24 hr tablet  Commonly known as:  IMDUR     metoprolol tartrate 25 MG tablet  Commonly known as:  LOPRESSOR     olmesartan 40 MG tablet  Commonly known as:  BENICAR      TAKE these medications        allopurinol 300 MG tablet  Commonly known as:  ZYLOPRIM  Take 300 mg by mouth daily.     aspirin EC 81 MG tablet  Take 81 mg by mouth daily.     calcium-vitamin D 250-100 MG-UNIT tablet  Take 2 tablets  by mouth 2 (two) times daily.     cholecalciferol 1000 units tablet  Commonly known as:  VITAMIN D  Take 2,000 Units by mouth daily.     citalopram 20 MG tablet  Commonly known as:  CELEXA  Take 20 mg by mouth at bedtime.     ferrous gluconate 324 MG tablet  Commonly known as:  FERGON  Take 1 tablet (324 mg total) by mouth 2 (two) times daily with a meal.     Fish Oil 1200 MG Caps  Take 1 capsule by mouth 2 (two) times daily.     HYDROcodone-acetaminophen 5-325 MG tablet  Commonly known as:  NORCO/VICODIN  Take 1-2 tablets by mouth every 6 (six) hours as needed for severe pain.     insulin aspart 100 UNIT/ML injection  Commonly known as:  novoLOG  Inject 2 Units into the skin 3 (three) times daily with meals.     insulin glargine 100 unit/mL Sopn  Commonly known as:  LANTUS  Inject 24-26 Units into the skin every morning.     isosorbide-hydrALAZINE 20-37.5 MG tablet  Commonly known as:  BIDIL  Take 1 tablet by mouth 3 (three) times daily.     levothyroxine 50 MCG tablet  Commonly known as:  SYNTHROID, LEVOTHROID  Take 50 mcg by mouth daily at 12 noon.     MELATONIN PO  Take 0.25 mg by mouth at bedtime.     multivitamin with minerals  Tabs tablet  Take 1 tablet by mouth daily.     torsemide 10 MG tablet  Commonly known as:  DEMADEX  Take 1 tablet (10 mg total) by mouth 2 (two) times daily.     warfarin 2.5 MG tablet  Commonly known as:  COUMADIN  TAKE 7.5 MG (THREE TABLETS) BY MOUTH DAILY EXCEPT TAKE 5 MG (TWO TABLETS) ON MONDAY, WEDNESDAY, FRIDAY OR AS DIRECTED        Follow-up Information    Follow up with [name], MD.   Specialty:  Internal Medicine   Contact information:   [address] [place] [address] [phone]        The results of significant diagnostics from this hospitalization (including imaging, microbiology, ancillary and laboratory) are listed below for reference.     Microbiology: No results found for this or any previous visit (from the past 240 hour(s)).   Labs: Basic Metabolic Panel:  Recent Labs Lab 09/14/15 0239 09/15/15 0231 09/16/15 0404 09/17/15 0215 09/18/15 0258  NA 139 141 141 139 137  K 4.1 3.4* 3.5 3.9 4.1  CL 102 100* 100* 99* 98*  CO2 30 33* 34* 30 29  GLUCOSE 88 116* 110* 119* 125*  BUN 43* 37* 35* 40* 43*  CREATININE 1.59* 1.31* 1.39* 1.70* 1.84*  CALCIUM 8.3* 8.7* 9.1 9.0 9.0   Liver Function Tests:  Recent Labs Lab 09/13/15 0225  AST 26  ALT 27  ALKPHOS 62  BILITOT 1.2  PROT 5.8*  ALBUMIN 3.1*   CBC:  Recent Labs Lab 09/14/15 0239 09/15/15 0231 09/16/15 0404 09/17/15 0215 09/18/15 0258  WBC 7.4 4.8 5.5 5.9 6.2  HGB 9.7* 10.3* 11.0* 11.5* 11.5*  HCT 32.1* 33.8* 35.9* 37.4 37.8  MCV 99.4 99.4 98.1 98.9 97.4  PLT 132* 135* 124* 141* 165   Cardiac Enzymes:  Recent Labs Lab 09/14/15 1458 09/14/15 1953 09/16/15 1519 09/16/15 2126 09/17/15 0215  TROPONINI 0.06* 0.07* 0.07* 0.08* 0.10*   BNP: BNP (last 3 results)  Recent Labs  09/12/15 0321  BNP 578.2*   CBG:  Recent Labs Lab 09/17/15 0629 09/17/15 1205 09/17/15 1643 09/17/15 2040 09/18/15 0602  GLUCAP 124* 152* 165* 113* 159*   SIGNED: Time  coordinating discharge:  30 minutes  MAGICK-MYERS, ISKRA, MD  [facility] 09/18/2015, 11:34 AM Pager [phone]  If 7PM-7AM, please contact night-coverage [email or web address] Password [email or web address]

## 2015-09-18 NOTE — Progress Notes (Signed)
ANTICOAGULATION CONSULT NOTE - Follow Up Consult  Pharmacy Consult for warfarin Indication: atrial fibrillation/ history of PE  Allergies  Allergen Reactions  . Morphine And Related Rash    Patient Measurements: Height: 5\' 3"  (160 cm) Weight: 262 lb (118.842 kg) (scale a) IBW/kg (Calculated) : 52.4  Vital Signs: Temp: 97.9 F (36.6 C) (06/13 1006) Temp Source: Oral (06/13 1006) BP: 144/67 mmHg (06/13 1006) Pulse Rate: 70 (06/13 1006)  Labs:  Recent Labs  09/16/15 0404 09/16/15 1519 09/16/15 2126 09/17/15 0215 09/18/15 0258  HGB 11.0*  --   --  11.5* 11.5*  HCT 35.9*  --   --  37.4 37.8  PLT 124*  --   --  141* 165  LABPROT 22.3*  --   --  21.4* 21.7*  INR 1.97*  --   --  1.87* 1.90*  CREATININE 1.39*  --   --  1.70* 1.84*  TROPONINI  --  0.07* 0.08* 0.10*  --     Estimated Creatinine Clearance: 41.1 mL/min (by C-G formula based on Cr of 1.84).   Medications:  Prescriptions prior to admission  Medication Sig Dispense Refill Last Dose  . allopurinol (ZYLOPRIM) 300 MG tablet Take 300 mg by mouth daily.   09/11/2015 at Unknown time  . aspirin EC 81 MG tablet Take 81 mg by mouth daily.   09/11/2015 at Unknown time  . calcium-vitamin D 250-100 MG-UNIT per tablet Take 2 tablets by mouth 2 (two) times daily.   09/11/2015 at Unknown time  . cholecalciferol (VITAMIN D) 1000 UNITS tablet Take 2,000 Units by mouth daily.   09/11/2015 at Unknown time  . citalopram (CELEXA) 20 MG tablet Take 20 mg by mouth at bedtime.   09/10/2015  . ferrous gluconate (FERGON) 324 MG tablet Take 1 tablet (324 mg total) by mouth 2 (two) times daily with a meal. 60 tablet 3 09/11/2015 at Unknown time  . furosemide (LASIX) 40 MG tablet Take 40 mg by mouth daily.   09/11/2015 at Unknown time  . HYDROcodone-acetaminophen (NORCO/VICODIN) 5-325 MG per tablet Take 1-2 tablets by mouth every 6 (six) hours as needed for severe pain. 30 tablet 0 3-4 days  . insulin aspart (NOVOLOG) 100 UNIT/ML injection Inject 2 Units  into the skin 3 (three) times daily with meals. (Patient taking differently: Inject 10 Units into the skin 3 (three) times daily with meals. ) 10 mL 11 09/11/2015 at Unknown time  . insulin glargine (LANTUS) 100 unit/mL SOPN Inject 24-26 Units into the skin every morning.    09/11/2015 at Unknown time  . isosorbide mononitrate (IMDUR) 30 MG 24 hr tablet Take 1 tablet (30 mg total) by mouth daily. 30 tablet 3 09/11/2015 at Unknown time  . levothyroxine (SYNTHROID, LEVOTHROID) 50 MCG tablet Take 50 mcg by mouth daily at 12 noon.   09/11/2015 at Unknown time  . MELATONIN PO Take 0.25 mg by mouth at bedtime.   09/10/2015  . metoprolol (LOPRESSOR) 25 MG tablet Take 1 tablet (25 mg total) by mouth 2 (two) times daily.   09/11/2015 at 1100 am   . Multiple Vitamin (MULITIVITAMIN WITH MINERALS) TABS Take 1 tablet by mouth daily.   09/11/2015 at Unknown time  . olmesartan (BENICAR) 40 MG tablet Take 40 mg by mouth daily.   09/11/2015 at Unknown time  . Omega-3 Fatty Acids (FISH OIL) 1200 MG CAPS Take 1 capsule by mouth 2 (two) times daily.   09/11/2015 at Unknown time  . warfarin (COUMADIN) 2.5 MG tablet  TAKE 7.5 MG (THREE TABLETS) BY MOUTH DAILY EXCEPT TAKE 5 MG (TWO TABLETS) ON MONDAY, WEDNESDAY, FRIDAY OR AS DIRECTED (Patient taking differently: Take 7.5 mg on Mon / Wed / Frid. Take 5mg  Sun / Tues Raynelle Dick / Sat) 120 tablet 0 09/11/2015 at Unknown time    Assessment: 60 yo F admitted 09/12/2015 on warfarin PTA for history of PE/Afib. Pharmacy consulted to dose warfarin.   INR 1.90 (up from 1.87 yesterday), Hgb 11.5 stable, Plt 165k. No s/sx of bleeding noted. Eating 75-100% meals since 6/11 PM.  INR began to decline below goal on 09/16/15 on continued home regimen of 5mg  qTTSS and 7.5mg  qMWF, thus we increased dose by giving 7.5 mg yesterday (Monday).   INR up slightly but remains below target INR of 2-3.   No bleeding noted   PTA warfarin dose: 5 mg daily EXCEPT for 7.5 mg on M/W/F, last taken PTA on 09/11/15. Warfarin continued  on admit date 09/12/15.   Continues on doxycycline since 09/12/15- may increase coumadin effect.  Allopurinol & levothyroxine can increase coumadin effect, however patient on these with coumadin PTA. Thus should not see any new effect on coumadin.   Goal of Therapy:  INR 2-3 Monitor platelets by anticoagulation protocol: Yes   Plan:  - Warfarin 7.5 mg x 1 tonight - Monitor daily INR, CBC and s/sx of bleeding  Update: Discharge summary in per Dr. Doyle Askew- to discharge today 09/18/15.  Thank you for allowing pharmacy to be part of this patients care team. Nicole Cella, RPh Clinical Pharmacist Pager: [phone] 09/18/2015 2:19 PM

## 2015-09-18 NOTE — Progress Notes (Signed)
Orders received for pt discharge.  Discharge summary printed and reviewed with pt.  Explained medication regimen, and pt had no further questions at this time.  IV removed and site remains clean, dry, intact.  Telemetry removed.  Pt in stable condition and awaiting transport. 

## 2015-10-11 ENCOUNTER — Ambulatory Visit (HOSPITAL_BASED_OUTPATIENT_CLINIC_OR_DEPARTMENT_OTHER): Payer: Medicare HMO

## 2015-10-11 ENCOUNTER — Telehealth: Payer: Self-pay

## 2015-10-11 DIAGNOSIS — Z86718 Personal history of other venous thrombosis and embolism: Secondary | ICD-10-CM | POA: Diagnosis not present

## 2015-10-11 DIAGNOSIS — Z86711 Personal history of pulmonary embolism: Secondary | ICD-10-CM

## 2015-10-11 DIAGNOSIS — D696 Thrombocytopenia, unspecified: Secondary | ICD-10-CM

## 2015-10-11 DIAGNOSIS — Z7901 Long term (current) use of anticoagulants: Secondary | ICD-10-CM

## 2015-10-11 LAB — PROTIME-INR
INR: 2.1 (ref 2.00–3.50)
Protime: 25.2 Seconds — ABNORMAL HIGH (ref 10.6–13.4)

## 2015-10-11 LAB — CBC WITH DIFFERENTIAL/PLATELET
BASO%: 1.1 % (ref 0.0–2.0)
Basophils Absolute: 0.1 10*3/uL (ref 0.0–0.1)
EOS%: 8.3 % — AB (ref 0.0–7.0)
Eosinophils Absolute: 0.4 10*3/uL (ref 0.0–0.5)
HCT: 33.7 % — ABNORMAL LOW (ref 34.8–46.6)
HGB: 10.7 g/dL — ABNORMAL LOW (ref 11.6–15.9)
LYMPH%: 16.8 % (ref 14.0–49.7)
MCH: 31 pg (ref 25.1–34.0)
MCHC: 31.8 g/dL (ref 31.5–36.0)
MCV: 97.7 fL (ref 79.5–101.0)
MONO#: 0.3 10*3/uL (ref 0.1–0.9)
MONO%: 6.4 % (ref 0.0–14.0)
NEUT%: 67.4 % (ref 38.4–76.8)
NEUTROS ABS: 3.2 10*3/uL (ref 1.5–6.5)
Platelets: 111 10*3/uL — ABNORMAL LOW (ref 145–400)
RBC: 3.45 10*6/uL — AB (ref 3.70–5.45)
RDW: 15.9 % — ABNORMAL HIGH (ref 11.2–14.5)
WBC: 4.7 10*3/uL (ref 3.9–10.3)
lymph#: 0.8 10*3/uL — ABNORMAL LOW (ref 0.9–3.3)
nRBC: 0 % (ref 0–0)

## 2015-10-11 LAB — TECHNOLOGIST REVIEW

## 2015-10-11 NOTE — Telephone Encounter (Signed)
Told [name] that her INR today was good at 2.10 per Dr. Marko Plume. She is to continue coumadin  7.5 mg M-W-F. 5 mg Tues., Thurs., Sat., and Sun. Repeat PT/INR on 11-08-15 as scheduled. [name] verbalized understanding.

## 2015-11-08 ENCOUNTER — Other Ambulatory Visit: Payer: Self-pay | Admitting: Oncology

## 2015-11-08 ENCOUNTER — Other Ambulatory Visit (HOSPITAL_BASED_OUTPATIENT_CLINIC_OR_DEPARTMENT_OTHER): Payer: Medicare HMO

## 2015-11-08 ENCOUNTER — Telehealth: Payer: Self-pay | Admitting: Oncology

## 2015-11-08 DIAGNOSIS — D696 Thrombocytopenia, unspecified: Secondary | ICD-10-CM | POA: Diagnosis not present

## 2015-11-08 DIAGNOSIS — Z86711 Personal history of pulmonary embolism: Secondary | ICD-10-CM

## 2015-11-08 DIAGNOSIS — Z7901 Long term (current) use of anticoagulants: Secondary | ICD-10-CM

## 2015-11-08 DIAGNOSIS — Z86718 Personal history of other venous thrombosis and embolism: Secondary | ICD-10-CM

## 2015-11-08 LAB — CBC WITH DIFFERENTIAL/PLATELET
BASO%: 0.7 % (ref 0.0–2.0)
Basophils Absolute: 0 10*3/uL (ref 0.0–0.1)
EOS%: 7 % (ref 0.0–7.0)
Eosinophils Absolute: 0.4 10*3/uL (ref 0.0–0.5)
HCT: 33.8 % — ABNORMAL LOW (ref 34.8–46.6)
HEMOGLOBIN: 10.8 g/dL — AB (ref 11.6–15.9)
LYMPH#: 0.7 10*3/uL — AB (ref 0.9–3.3)
LYMPH%: 12.7 % — ABNORMAL LOW (ref 14.0–49.7)
MCH: 31.7 pg (ref 25.1–34.0)
MCHC: 32 g/dL (ref 31.5–36.0)
MCV: 99.1 fL (ref 79.5–101.0)
MONO#: 0.5 10*3/uL (ref 0.1–0.9)
MONO%: 8.3 % (ref 0.0–14.0)
NEUT#: 3.9 10*3/uL (ref 1.5–6.5)
NEUT%: 71.3 % (ref 38.4–76.8)
NRBC: 0 % (ref 0–0)
Platelets: 107 10*3/uL — ABNORMAL LOW (ref 145–400)
RBC: 3.41 10*6/uL — AB (ref 3.70–5.45)
RDW: 16.1 % — AB (ref 11.2–14.5)
WBC: 5.4 10*3/uL (ref 3.9–10.3)

## 2015-11-08 LAB — PROTIME-INR
INR: 1.8 — AB (ref 2.00–3.50)
PROTIME: 21.6 s — AB (ref 10.6–13.4)

## 2015-11-08 NOTE — Telephone Encounter (Signed)
Medical Oncology  INR 1.8 today on coumadin 7.5 mg MWF/ 5 mg TTSS This has been stable dose, however she has run just a little low INR recently, 2.1 on 10-11-15 and 1.9 on 08-16-15.  Per patient no missed doses. PCP adjusting lasix for increased LE swelling, no SOB, no bleeding.  Told patient to take total 7.5 mg today, then going forward will increase coumadin to 7.5 mg MWFSat/ 5 mg TTSun.  She repeated instructions correctly.   POF sent for additional appointments.  Godfrey Pick, MD

## 2015-11-09 ENCOUNTER — Telehealth: Payer: Self-pay | Admitting: Oncology

## 2015-11-09 NOTE — Telephone Encounter (Signed)
cld & spoke to pt and gave pt time & date of appt for 8/31-pt stated will get remaining sch on MY CHART

## 2015-11-13 ENCOUNTER — Other Ambulatory Visit: Payer: Self-pay

## 2015-11-13 MED ORDER — WARFARIN SODIUM 2.5 MG PO TABS: ORAL_TABLET | ORAL | 1 refills | Status: DC

## 2015-12-06 ENCOUNTER — Telehealth: Payer: Self-pay | Admitting: Oncology

## 2015-12-06 ENCOUNTER — Other Ambulatory Visit: Payer: Medicare HMO

## 2015-12-06 NOTE — Telephone Encounter (Signed)
PATIENT CALLED TO [facility] APPT. 12/05/15

## 2015-12-12 ENCOUNTER — Telehealth: Payer: Self-pay

## 2015-12-12 ENCOUNTER — Other Ambulatory Visit (HOSPITAL_BASED_OUTPATIENT_CLINIC_OR_DEPARTMENT_OTHER): Payer: Medicare HMO

## 2015-12-12 DIAGNOSIS — Z86718 Personal history of other venous thrombosis and embolism: Secondary | ICD-10-CM | POA: Diagnosis not present

## 2015-12-12 DIAGNOSIS — Z86711 Personal history of pulmonary embolism: Secondary | ICD-10-CM

## 2015-12-12 LAB — PROTIME-INR
INR: 2.2 (ref 2.00–3.50)
Protime: 26.4 Seconds — ABNORMAL HIGH (ref 10.6–13.4)

## 2015-12-12 NOTE — Telephone Encounter (Signed)
LM in [name]'s VM stating the coumadin dosing as noted below by Dr. Marko Plume. Keep lab appointment on 01-03-16 as scheduled. She may call back to the office if she has any questions or concerns.

## 2015-12-12 NOTE — Telephone Encounter (Signed)
-----   Message from Gordy Levan, MD sent at 12/12/2015  2:49 PM EDT ----- Regarding: RE: PT/INR Continue same dose, just right thanks ----- Message ----- From: Baruch Merl, RN Sent: 12/12/2015  12:54 PM To: Lennis Marion Downer, MD Subject: PT/INR                                         INR today is 2.20  Coumadin dosing currently is 7.5mg  MWFSat/ 5 mg TTHSun. Next lab is 01-03-16.  Coumadin Dose?  Barbaraann Share

## 2016-01-03 ENCOUNTER — Telehealth: Payer: Self-pay | Admitting: Oncology

## 2016-01-03 ENCOUNTER — Other Ambulatory Visit: Payer: Medicare HMO

## 2016-01-03 NOTE — Telephone Encounter (Signed)
09/28 Appointment rescheduled per patient request.

## 2016-01-08 ENCOUNTER — Other Ambulatory Visit (HOSPITAL_BASED_OUTPATIENT_CLINIC_OR_DEPARTMENT_OTHER): Payer: Medicare HMO

## 2016-01-08 ENCOUNTER — Telehealth: Payer: Self-pay

## 2016-01-08 DIAGNOSIS — Z86711 Personal history of pulmonary embolism: Secondary | ICD-10-CM | POA: Diagnosis not present

## 2016-01-08 LAB — PROTIME-INR
INR: 1.9 — AB (ref 2.00–3.50)
Protime: 22.8 Seconds — ABNORMAL HIGH (ref 10.6–13.4)

## 2016-01-08 NOTE — Telephone Encounter (Signed)
Today's INR 1.9 Coumadin taking 7.5 mg on MWFSat        5 mg on TTSun Next lab 10/26  Any changes?

## 2016-01-08 NOTE — Telephone Encounter (Signed)
lvm per Dr Livesay's attached message. 

## 2016-01-08 NOTE — Telephone Encounter (Signed)
-----   Message from Gordy Levan, MD sent at 01/08/2016  2:30 PM EDT -----  Today's INR 1.9 Coumadin taking 7.5 mg on MWFSat                              5 mg on TTSun Next lab 10/26  Any changes?   In result note and repeated here:  take 7.5 mg coumadin today one time, then same 7.5 mg MWFSun and 5 mg TTSat Thank you!

## 2016-01-31 ENCOUNTER — Telehealth: Payer: Self-pay | Admitting: *Deleted

## 2016-01-31 ENCOUNTER — Other Ambulatory Visit (HOSPITAL_BASED_OUTPATIENT_CLINIC_OR_DEPARTMENT_OTHER): Payer: Medicare HMO

## 2016-01-31 DIAGNOSIS — Z86711 Personal history of pulmonary embolism: Secondary | ICD-10-CM | POA: Diagnosis not present

## 2016-01-31 LAB — PROTIME-INR
INR: 2.1 (ref 2.00–3.50)
PROTIME: 25.2 s — AB (ref 10.6–13.4)

## 2016-01-31 NOTE — Telephone Encounter (Signed)
Left message on voicemail instructing pt to continue same dose of Coumadin. Follow up as scheduled.

## 2016-02-10 ENCOUNTER — Other Ambulatory Visit: Payer: Self-pay | Admitting: Oncology

## 2016-02-27 ENCOUNTER — Other Ambulatory Visit: Payer: Medicare HMO

## 2016-03-13 ENCOUNTER — Other Ambulatory Visit: Payer: Medicare HMO

## 2016-03-27 ENCOUNTER — Encounter: Payer: Self-pay | Admitting: Oncology

## 2016-03-27 ENCOUNTER — Other Ambulatory Visit: Payer: Self-pay | Admitting: Oncology

## 2016-03-27 ENCOUNTER — Other Ambulatory Visit (HOSPITAL_BASED_OUTPATIENT_CLINIC_OR_DEPARTMENT_OTHER): Payer: Medicare HMO

## 2016-03-27 ENCOUNTER — Telehealth: Payer: Self-pay

## 2016-03-27 DIAGNOSIS — Z86711 Personal history of pulmonary embolism: Secondary | ICD-10-CM

## 2016-03-27 LAB — PROTIME-INR
INR: 1.9 — AB (ref 2.00–3.50)
PROTIME: 22.8 s — AB (ref 10.6–13.4)

## 2016-03-27 NOTE — Progress Notes (Signed)
Medical Oncology  03-27-16  Dr Mellody Drown-  Our mutual patient [name].Piggott continues long term anticoagulation for history of recurrent PEs and DVT.   As she would need to transfer care to another MD at [facility] after 04-2016 if she continues monitoring here, patient instead would prefer to have coumadin followed by your office if possible.  She would be due check again about the time that she already is scheduled to see you in 04-2016.   Certainly she could be seen back by hematologist or oncologist if needed in future.   Thank you for your care of this very nice lady-  Sincerely,    Evlyn Clines MD

## 2016-03-27 NOTE — Telephone Encounter (Signed)
S/w pt per Dr Marko Plume to take 10 mg today and continue with same dosage. Her current dosage is 7.5 mg MWFSun, and 5 mg TTSat.  Let pt know Dr Marko Plume will not be at [facility] after January. Pt chose to follow coumadin with her PCP Dr Mellody Drown. She has an appt with him already scheduled for 04/25/15. Will let Dr Marko Plume know.

## 2016-03-28 ENCOUNTER — Telehealth: Payer: Self-pay | Admitting: Oncology

## 2016-03-28 NOTE — Telephone Encounter (Signed)
Faxed OV note, med list, labs to Dr Mellody Drown per Dr Marko Plume.

## 2016-03-28 NOTE — Telephone Encounter (Signed)
Medical Oncology  MD spoke with Dr Adela Ports office, fine to follow coumadin there. Next appointment already scheduled in late Jan when correct for next INR. We have been in communication with PCP thru follow up here, but will also fax records now to be sure everything is updated.  Fax OrchardMarko Plume, MD

## 2016-04-24 ENCOUNTER — Other Ambulatory Visit: Payer: Medicare HMO

## 2016-04-24 ENCOUNTER — Telehealth: Payer: Self-pay | Admitting: Oncology

## 2016-04-24 NOTE — Telephone Encounter (Signed)
Appointments rescheduled per patient request; Per lab tech

## 2016-05-04 ENCOUNTER — Inpatient Hospital Stay (HOSPITAL_COMMUNITY)
Admission: EM | Admit: 2016-05-04 | Discharge: 2016-05-08 | DRG: 193 | Disposition: A | Discharge status: Home Health | Payer: Medicare HMO | Attending: Family Medicine | Admitting: Family Medicine

## 2016-05-04 ENCOUNTER — Encounter (HOSPITAL_COMMUNITY): Payer: Self-pay | Admitting: Emergency Medicine

## 2016-05-04 ENCOUNTER — Emergency Department (HOSPITAL_COMMUNITY): Payer: Medicare HMO

## 2016-05-04 DIAGNOSIS — K219 Gastro-esophageal reflux disease without esophagitis: Secondary | ICD-10-CM | POA: Diagnosis present

## 2016-05-04 DIAGNOSIS — J9601 Acute respiratory failure with hypoxia: Secondary | ICD-10-CM | POA: Diagnosis present

## 2016-05-04 DIAGNOSIS — Z79891 Long term (current) use of opiate analgesic: Secondary | ICD-10-CM

## 2016-05-04 DIAGNOSIS — Z86711 Personal history of pulmonary embolism: Secondary | ICD-10-CM

## 2016-05-04 DIAGNOSIS — I2729 Other secondary pulmonary hypertension: Secondary | ICD-10-CM | POA: Diagnosis present

## 2016-05-04 DIAGNOSIS — E785 Hyperlipidemia, unspecified: Secondary | ICD-10-CM | POA: Diagnosis present

## 2016-05-04 DIAGNOSIS — Z87891 Personal history of nicotine dependence: Secondary | ICD-10-CM

## 2016-05-04 DIAGNOSIS — J101 Influenza due to other identified influenza virus with other respiratory manifestations: Principal | ICD-10-CM | POA: Diagnosis present

## 2016-05-04 DIAGNOSIS — Z9049 Acquired absence of other specified parts of digestive tract: Secondary | ICD-10-CM

## 2016-05-04 DIAGNOSIS — R69 Illness, unspecified: Secondary | ICD-10-CM

## 2016-05-04 DIAGNOSIS — E1165 Type 2 diabetes mellitus with hyperglycemia: Secondary | ICD-10-CM | POA: Diagnosis present

## 2016-05-04 DIAGNOSIS — I130 Hypertensive heart and chronic kidney disease with heart failure and stage 1 through stage 4 chronic kidney disease, or unspecified chronic kidney disease: Secondary | ICD-10-CM | POA: Diagnosis present

## 2016-05-04 DIAGNOSIS — Z885 Allergy status to narcotic agent status: Secondary | ICD-10-CM

## 2016-05-04 DIAGNOSIS — J449 Chronic obstructive pulmonary disease, unspecified: Secondary | ICD-10-CM | POA: Diagnosis present

## 2016-05-04 DIAGNOSIS — I4891 Unspecified atrial fibrillation: Secondary | ICD-10-CM | POA: Diagnosis present

## 2016-05-04 DIAGNOSIS — E860 Dehydration: Secondary | ICD-10-CM | POA: Diagnosis present

## 2016-05-04 DIAGNOSIS — I5033 Acute on chronic diastolic (congestive) heart failure: Secondary | ICD-10-CM | POA: Diagnosis not present

## 2016-05-04 DIAGNOSIS — E1122 Type 2 diabetes mellitus with diabetic chronic kidney disease: Secondary | ICD-10-CM | POA: Diagnosis present

## 2016-05-04 DIAGNOSIS — Z6841 Body Mass Index (BMI) 40.0 and over, adult: Secondary | ICD-10-CM

## 2016-05-04 DIAGNOSIS — E039 Hypothyroidism, unspecified: Secondary | ICD-10-CM | POA: Diagnosis present

## 2016-05-04 DIAGNOSIS — R2689 Other abnormalities of gait and mobility: Secondary | ICD-10-CM

## 2016-05-04 DIAGNOSIS — J111 Influenza due to unidentified influenza virus with other respiratory manifestations: Secondary | ICD-10-CM

## 2016-05-04 DIAGNOSIS — R0902 Hypoxemia: Secondary | ICD-10-CM | POA: Diagnosis present

## 2016-05-04 DIAGNOSIS — Z794 Long term (current) use of insulin: Secondary | ICD-10-CM

## 2016-05-04 DIAGNOSIS — Z7901 Long term (current) use of anticoagulants: Secondary | ICD-10-CM

## 2016-05-04 DIAGNOSIS — R0789 Other chest pain: Secondary | ICD-10-CM

## 2016-05-04 DIAGNOSIS — G4733 Obstructive sleep apnea (adult) (pediatric): Secondary | ICD-10-CM | POA: Diagnosis present

## 2016-05-04 DIAGNOSIS — E1121 Type 2 diabetes mellitus with diabetic nephropathy: Secondary | ICD-10-CM | POA: Diagnosis present

## 2016-05-04 DIAGNOSIS — Z7982 Long term (current) use of aspirin: Secondary | ICD-10-CM

## 2016-05-04 DIAGNOSIS — Z79899 Other long term (current) drug therapy: Secondary | ICD-10-CM

## 2016-05-04 DIAGNOSIS — I10 Essential (primary) hypertension: Secondary | ICD-10-CM | POA: Diagnosis not present

## 2016-05-04 DIAGNOSIS — R3121 Asymptomatic microscopic hematuria: Secondary | ICD-10-CM | POA: Diagnosis present

## 2016-05-04 DIAGNOSIS — Z8249 Family history of ischemic heart disease and other diseases of the circulatory system: Secondary | ICD-10-CM

## 2016-05-04 DIAGNOSIS — R0602 Shortness of breath: Secondary | ICD-10-CM | POA: Diagnosis not present

## 2016-05-04 DIAGNOSIS — N183 Chronic kidney disease, stage 3 (moderate): Secondary | ICD-10-CM | POA: Diagnosis present

## 2016-05-04 DIAGNOSIS — Z86718 Personal history of other venous thrombosis and embolism: Secondary | ICD-10-CM

## 2016-05-04 LAB — COMPREHENSIVE METABOLIC PANEL
ALBUMIN: 3.9 g/dL (ref 3.5–5.0)
ALK PHOS: 70 U/L (ref 38–126)
ALT: 35 U/L (ref 14–54)
AST: 44 U/L — AB (ref 15–41)
Anion gap: 13 (ref 5–15)
BILIRUBIN TOTAL: 1.1 mg/dL (ref 0.3–1.2)
BUN: 21 mg/dL — AB (ref 6–20)
CALCIUM: 9.7 mg/dL (ref 8.9–10.3)
CO2: 26 mmol/L (ref 22–32)
Chloride: 102 mmol/L (ref 101–111)
Creatinine, Ser: 1.26 mg/dL — ABNORMAL HIGH (ref 0.44–1.00)
GFR calc Af Amer: 53 mL/min — ABNORMAL LOW (ref >60)
GFR calc non Af Amer: 45 mL/min — ABNORMAL LOW (ref >60)
GLUCOSE: 168 mg/dL — AB (ref 65–99)
Potassium: 3.9 mmol/L (ref 3.5–5.1)
Sodium: 141 mmol/L (ref 135–145)
TOTAL PROTEIN: 6.5 g/dL (ref 6.5–8.1)

## 2016-05-04 LAB — INFLUENZA PANEL BY PCR (TYPE A & B)
INFLAPCR: POSITIVE — AB
Influenza B By PCR: NEGATIVE

## 2016-05-04 LAB — URINALYSIS, ROUTINE W REFLEX MICROSCOPIC
Bacteria, UA: NONE SEEN
Bilirubin Urine: NEGATIVE
GLUCOSE, UA: NEGATIVE mg/dL
Ketones, ur: NEGATIVE mg/dL
LEUKOCYTES UA: NEGATIVE
NITRITE: NEGATIVE
Protein, ur: >=300 mg/dL — AB
SPECIFIC GRAVITY, URINE: 1.016 (ref 1.005–1.030)
pH: 6 (ref 5.0–8.0)

## 2016-05-04 LAB — I-STAT CG4 LACTIC ACID, ED
Lactic Acid, Venous: 1.14 mmol/L (ref 0.5–1.9)
Lactic Acid, Venous: 1.28 mmol/L (ref 0.5–1.9)

## 2016-05-04 LAB — CBC WITH DIFFERENTIAL/PLATELET
BASOS ABS: 0 10*3/uL (ref 0.0–0.1)
Basophils Relative: 1 %
EOS PCT: 1 %
Eosinophils Absolute: 0 10*3/uL (ref 0.0–0.7)
HCT: 37.6 % (ref 36.0–46.0)
Hemoglobin: 11.6 g/dL — ABNORMAL LOW (ref 12.0–15.0)
Lymphocytes Relative: 7 %
Lymphs Abs: 0.4 10*3/uL — ABNORMAL LOW (ref 0.7–4.0)
MCH: 30.5 pg (ref 26.0–34.0)
MCHC: 30.9 g/dL (ref 30.0–36.0)
MCV: 98.9 fL (ref 78.0–100.0)
MONO ABS: 0.2 10*3/uL (ref 0.1–1.0)
Monocytes Relative: 3 %
Neutro Abs: 4.6 10*3/uL (ref 1.7–7.7)
Neutrophils Relative %: 88 %
PLATELETS: 127 10*3/uL — AB (ref 150–400)
RBC: 3.8 MIL/uL — ABNORMAL LOW (ref 3.87–5.11)
RDW: 15.3 % (ref 11.5–15.5)
WBC: 5.2 10*3/uL (ref 4.0–10.5)

## 2016-05-04 LAB — TROPONIN I: Troponin I: 0.1 ng/mL (ref <0.03)

## 2016-05-04 LAB — RAPID STREP SCREEN (MED CTR MEBANE ONLY): STREPTOCOCCUS, GROUP A SCREEN (DIRECT): NEGATIVE

## 2016-05-04 LAB — GLUCOSE, CAPILLARY: Glucose-Capillary: 209 mg/dL — ABNORMAL HIGH (ref 65–99)

## 2016-05-04 MED ORDER — ALBUTEROL SULFATE (2.5 MG/3ML) 0.083% IN NEBU
2.5000 mg | INHALATION_SOLUTION | RESPIRATORY_TRACT | Status: DC | PRN
  Administered 2016-05-04: 2.5 mg via RESPIRATORY_TRACT
  Filled 2016-05-04: qty 3

## 2016-05-04 MED ORDER — ONDANSETRON HCL 4 MG PO TABS: 4.0000 mg | ORAL_TABLET | Freq: Four times a day (QID) | ORAL | Status: DC | PRN

## 2016-05-04 MED ORDER — BENZONATATE 100 MG PO CAPS
200.0000 mg | ORAL_CAPSULE | Freq: Once | ORAL | Status: AC
  Administered 2016-05-04: 200 mg via ORAL
  Filled 2016-05-04: qty 2

## 2016-05-04 MED ORDER — INSULIN ASPART 100 UNIT/ML ~~LOC~~ SOLN
0.0000 [IU] | Freq: Three times a day (TID) | SUBCUTANEOUS | Status: DC
  Administered 2016-05-05: 2 [IU] via SUBCUTANEOUS
  Administered 2016-05-05: 3 [IU] via SUBCUTANEOUS
  Administered 2016-05-05 – 2016-05-08 (×2): 2 [IU] via SUBCUTANEOUS

## 2016-05-04 MED ORDER — ALLOPURINOL 300 MG PO TABS
300.0000 mg | ORAL_TABLET | Freq: Every day | ORAL | Status: DC
  Administered 2016-05-05 – 2016-05-08 (×4): 300 mg via ORAL
  Filled 2016-05-04 (×4): qty 1

## 2016-05-04 MED ORDER — METHYLPREDNISOLONE SODIUM SUCC 125 MG IJ SOLR
125.0000 mg | Freq: Once | INTRAMUSCULAR | Status: AC
  Administered 2016-05-04: 125 mg via INTRAVENOUS
  Filled 2016-05-04: qty 2

## 2016-05-04 MED ORDER — HYDROCOD POLST-CPM POLST ER 10-8 MG/5ML PO SUER: 5.0000 mL | Freq: Two times a day (BID) | ORAL | 0 refills | Status: DC

## 2016-05-04 MED ORDER — LEVOTHYROXINE SODIUM 50 MCG PO TABS
50.0000 ug | ORAL_TABLET | Freq: Every day | ORAL | Status: DC
  Administered 2016-05-05 – 2016-05-08 (×4): 50 ug via ORAL
  Filled 2016-05-04 (×4): qty 1

## 2016-05-04 MED ORDER — MELATONIN 3 MG PO TABS
3.0000 mg | ORAL_TABLET | Freq: Every day | ORAL | Status: DC
  Administered 2016-05-04 – 2016-05-07 (×4): 3 mg via ORAL
  Filled 2016-05-04 (×5): qty 1

## 2016-05-04 MED ORDER — ONDANSETRON HCL 4 MG/2ML IJ SOLN
4.0000 mg | Freq: Once | INTRAMUSCULAR | Status: AC
  Administered 2016-05-04: 4 mg via INTRAVENOUS
  Filled 2016-05-04: qty 2

## 2016-05-04 MED ORDER — FUROSEMIDE 80 MG PO TABS: 80.0000 mg | ORAL_TABLET | Freq: Every day | ORAL | Status: DC

## 2016-05-04 MED ORDER — INSULIN GLARGINE 100 UNIT/ML ~~LOC~~ SOLN
12.0000 [IU] | Freq: Every day | SUBCUTANEOUS | Status: DC
  Administered 2016-05-04 – 2016-05-08 (×4): 12 [IU] via SUBCUTANEOUS
  Filled 2016-05-04 (×4): qty 0.12

## 2016-05-04 MED ORDER — INSULIN ASPART 100 UNIT/ML ~~LOC~~ SOLN: 0.0000 [IU] | Freq: Every day | SUBCUTANEOUS | Status: DC

## 2016-05-04 MED ORDER — BENZONATATE 100 MG PO CAPS: 100.0000 mg | ORAL_CAPSULE | Freq: Three times a day (TID) | ORAL | 0 refills | Status: DC

## 2016-05-04 MED ORDER — ISOSORBIDE MONONITRATE ER 60 MG PO TB24
120.0000 mg | ORAL_TABLET | Freq: Every day | ORAL | Status: DC
Start: 2016-05-05 — End: 2016-05-08
  Administered 2016-05-05 – 2016-05-08 (×4): 120 mg via ORAL
  Filled 2016-05-04 (×4): qty 2

## 2016-05-04 MED ORDER — HYDRALAZINE HCL 25 MG PO TABS
25.0000 mg | ORAL_TABLET | Freq: Three times a day (TID) | ORAL | Status: DC
  Administered 2016-05-04 – 2016-05-08 (×12): 25 mg via ORAL
  Filled 2016-05-04 (×12): qty 1

## 2016-05-04 MED ORDER — SODIUM CHLORIDE 0.9 % IV BOLUS (SEPSIS)
1000.0000 mL | Freq: Once | INTRAVENOUS | Status: AC
  Administered 2016-05-04: 1000 mL via INTRAVENOUS

## 2016-05-04 MED ORDER — ASPIRIN EC 81 MG PO TBEC
81.0000 mg | DELAYED_RELEASE_TABLET | Freq: Every day | ORAL | Status: DC
  Administered 2016-05-05 – 2016-05-08 (×4): 81 mg via ORAL
  Filled 2016-05-04 (×4): qty 1

## 2016-05-04 MED ORDER — ONDANSETRON 4 MG PO TBDP: 4.0000 mg | ORAL_TABLET | Freq: Three times a day (TID) | ORAL | 0 refills | Status: DC | PRN

## 2016-05-04 MED ORDER — CITALOPRAM HYDROBROMIDE 20 MG PO TABS
20.0000 mg | ORAL_TABLET | Freq: Every day | ORAL | Status: DC
  Administered 2016-05-04 – 2016-05-07 (×4): 20 mg via ORAL
  Filled 2016-05-04 (×4): qty 1

## 2016-05-04 MED ORDER — ONDANSETRON HCL 4 MG/2ML IJ SOLN: 4.0000 mg | Freq: Four times a day (QID) | INTRAMUSCULAR | Status: DC | PRN

## 2016-05-04 MED ORDER — OSELTAMIVIR PHOSPHATE 75 MG PO CAPS: 75.0000 mg | ORAL_CAPSULE | Freq: Two times a day (BID) | ORAL | 0 refills | Status: DC

## 2016-05-04 MED ORDER — DIAZEPAM 5 MG PO TABS
5.0000 mg | ORAL_TABLET | Freq: Every evening | ORAL | Status: DC | PRN
  Filled 2016-05-04: qty 1

## 2016-05-04 MED ORDER — ALBUTEROL (5 MG/ML) CONTINUOUS INHALATION SOLN
10.0000 mg/h | INHALATION_SOLUTION | Freq: Once | RESPIRATORY_TRACT | Status: AC
  Administered 2016-05-04: 10 mg/h via RESPIRATORY_TRACT
  Filled 2016-05-04: qty 20

## 2016-05-04 NOTE — ED Provider Notes (Addendum)
[facility] DEPT Provider Note   CSN: [id number] Arrival date & time: 05/04/16  1411     History   Chief Complaint Chief Complaint  Patient presents with  . Influenza  . Nausea  . Emesis  . Cough    HPI [name] is a 61 y.o. female. Chief complaint is flulike symptoms.  HPI: Patient states she hasn't symptoms since Friday. Started with body aches myalgias cough vomiting. Said she could not keep any fluids for the last 2 days. The cough doesn't describe it". States she coughs almost constantly. Was concerned because she has a history of stage II kidney disease, and was concerned she was getting dehydrated. Did receive a flu vaccine.  Past Medical History:  Diagnosis Date  . A-fib ([place])   . Anemia   . Anxiety   . Arthritis    "right hip; both knees; left wrist/shoulder; back" (01/19/2013"  . Bleeding on Coumadin 08/2012; 01/18/2013   [facility] admissions (01/19/2013)  . CHF (congestive heart failure) ([place])    "2-3 times" (01/19/2013)  . Chronic lower back pain   . Depression   . DVT (deep venous thrombosis) ([place]) 10 years ago   numerous/notes 01/18/2013  . GERD (gastroesophageal reflux disease)   . Gout   . XFGHWEXH(371.6)    "maybe weekly" (01/19/2013)  . Heart murmur   . High cholesterol    "been off RX for this at one time" (01/18/2013)  . History of blood transfusion 1983; 04/2012   "3 w/ childbirth; hospitalized for pain" (01/19/2013)  . Hypertension   . Hypothyroidism   . Migraines    "twice/yr maybe" (01/19/2013)  . OSA (obstructive sleep apnea)    "sent me for test in 04/2012; never ordered mask, etc" (01/19/2013)  . PE (pulmonary thromboembolism) ([place]) 3 years ag0   3/notes 01/18/2013  . Pneumonia before 2011   "once' (01/18/2013)  . Renal disorder    kindey function low; "Metformin was destroying my kidneys" (01/19/2013)  . Shortness of breath    "only related to my CHF" (01/18/2013)  . Swelling of hand 08/31/2014   RT HAND  . Type II diabetes  mellitus ([place])   . UTI (urinary tract infection) 08/31/2014    Patient Active Problem List   Diagnosis Date Noted  . CHF (congestive heart failure) ([place]) 09/12/2015  . COPD exacerbation ([place]) 09/12/2015  . Personal history of endocrine, metabolic or immunity disorder 04/03/2015  . Decreased mobility 04/03/2015  . DVT (deep venous thrombosis), bilateral 02/24/2015  . CKD (chronic kidney disease) stage 3, GFR 30-59 ml/min 02/24/2015  . Long-term (current) use of anticoagulants 11/19/2014  . Osteoarthritis 08/31/2014  . Cellulitis of hand, right 08/31/2014  . Tremors  05/07/2012  . Hypoglycemia 05/07/2012  . Abdominal pain 05/04/2012  . Sleep apnea, has not started titration  05/03/2012  . UTI (lower urinary tract infection) 05/03/2012  . UTI (urinary tract infection) 05/03/2012  . DVT, bil in 08/2011 09/03/2011  . Anticoagulated on Coumadin, chronically 09/03/2011  . 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 Rectal bleeding 08/16/2011  . Diastolic dysfunction, grade 2, EF 65-70% May 2013 08/14/2011  . LBBB (left bundle branch block) 08/14/2011  . Chronic renal insufficiency, stage IV (severe) - due to DM & HTN, SCr improving-1.6 08/14/2011    Class: Chronic  . Angina pectoris ([place]) 08/13/2011  . Insulin dependent diabetes mellitus - with Proteinuria 08/13/2011  . Morbid obesity, + sleep apnea 08/13/2011  . HTN (hypertension) 08/13/2011  .  HLD (hyperlipidemia) 08/13/2011  . Anemia - due to GI bleed & chronic disease. 08/13/2011  . History of pulmonary embolism, recurrent, 3 seperate episodes. 08/13/2011  . Presence of IVC filter, placed June 2011 after 3d PE 08/13/2011  . Pulmonary HTN, Moderate  PA pressure 72mmHG 09/2009 08/13/2011  . Sinus bradycardia 08/13/2011    Past Surgical History:  Procedure Laterality Date  . CARDIAC CATHETERIZATION N/A 03/13/2015   Procedure: Right/Left Heart Cath and Coronary Angiography;  Surgeon: Adrian Prows, MD;  Location: [facility];  Service: Cardiovascular;  Laterality: N/A;  . CATARACT EXTRACTION W/ INTRAOCULAR LENS  IMPLANT, BILATERAL Bilateral 2006-2011  . CESAREAN SECTION  1983  . CHOLECYSTECTOMY  ~ 2002  . COLONOSCOPY N/A 01/21/2013   Procedure: COLONOSCOPY;  Surgeon: Beryle Beams, MD;  Location: [facility];  Service: Endoscopy;  Laterality: N/A;  . EYE SURGERY Bilateral    "multiple" (01/18/2013)  . PARS PLANA REPAIR OF RETINAL DEATACHMENT Right   . PARS PLANA VITRECTOMY Bilateral 2004-2006   "several" (01/18/2013)  . REFRACTIVE SURGERY Bilateral    "for stigmatism" (01/18/2013)  . REFRACTIVE SURGERY Left ~ 11/2012   "to puff it up cause vision got hazy" (01/18/2013)  . VENA CAVA FILTER PLACEMENT  2011?    OB History    No data available       Home Medications    Prior to Admission medications   Medication Sig Start Date End Date Taking? Authorizing Provider  allopurinol (ZYLOPRIM) 300 MG tablet Take 300 mg by mouth daily.    Historical Provider, MD  aspirin EC 81 MG tablet Take 81 mg by mouth daily.    Historical Provider, MD  benzonatate (TESSALON) 100 MG capsule Take 1 capsule (100 mg total) by mouth every 8 (eight) hours. 05/04/16   Tanna Furry, MD  calcium-vitamin D 250-100 MG-UNIT per tablet Take 2 tablets by mouth 2 (two) times daily.    Historical Provider, MD  chlorpheniramine-HYDROcodone (TUSSIONEX PENNKINETIC ER) 10-8 MG/5ML SUER Take 5 mLs by mouth 2 (two) times daily. 05/04/16   Tanna Furry, MD  cholecalciferol (VITAMIN D) 1000 UNITS tablet Take 2,000 Units by mouth daily.    Lauraine Rinne, MD  citalopram (CELEXA) 20 MG tablet Take 20 mg by mouth at bedtime.    Historical Provider, MD  ferrous gluconate (FERGON) 324 MG tablet Take 1 tablet (324 mg total) by mouth 2 (two) times daily with a meal. 05/11/12   Ripudeep Krystal Eaton, MD  HYDROcodone-acetaminophen (NORCO/VICODIN) 5-325 MG per tablet Take 1-2 tablets by mouth every 6 (six) hours as needed for  severe pain. 09/04/14   Velvet Bathe, MD  insulin aspart (NOVOLOG) 100 UNIT/ML injection Inject 2 Units into the skin 3 (three) times daily with meals. Patient taking differently: Inject 10 Units into the skin 3 (three) times daily with meals.  09/04/14   Velvet Bathe, MD  insulin glargine (LANTUS) 100 unit/mL SOPN Inject 24-26 Units into the skin every morning.     Historical Provider, MD  isosorbide-hydrALAZINE (BIDIL) 20-37.5 MG tablet Take 1 tablet by mouth 3 (three) times daily. 09/18/15   Theodis Blaze, MD  levothyroxine (SYNTHROID, LEVOTHROID) 50 MCG tablet Take 50 mcg by mouth daily at 12 noon.    Historical Provider, MD  MELATONIN PO Take 0.25 mg by mouth at bedtime.    Historical Provider, MD  Multiple Vitamin (MULITIVITAMIN WITH MINERALS) TABS Take 1 tablet by mouth daily.    Historical Provider, MD  Omega-3 Fatty Acids ([place]  OIL) 1200 MG CAPS Take 1 capsule by mouth 2 (two) times daily.    Historical Provider, MD  ondansetron (ZOFRAN ODT) 4 MG disintegrating tablet Take 1 tablet (4 mg total) by mouth every 8 (eight) hours as needed for nausea. 05/04/16   Tanna Furry, MD  oseltamivir (TAMIFLU) 75 MG capsule Take 1 capsule (75 mg total) by mouth every 12 (twelve) hours. 05/04/16   Tanna Furry, MD  torsemide (DEMADEX) 10 MG tablet Take 1 tablet (10 mg total) by mouth 2 (two) times daily. 09/18/15   Theodis Blaze, MD  warfarin (COUMADIN) 2.5 MG tablet TAKE 7.5 MG ON M-W-F AND 5 MG TUES, THURS, SAT AND SUN AS DIRECTED Patient taking differently: TAKE 7.5 MG ON M-W-F-Sun AND 5 MG TUES, THURS, SAT  AS DIRECTED 02/10/16   Lennis Marion Downer, MD    Family History Family History  Problem Relation Age of Onset  . Cerebral aneurysm Mother   . Hypertension Father   . Cerebral aneurysm Maternal Grandfather   . Cerebral aneurysm Maternal Aunt   . Cancer Maternal Uncle     Social History Social History  Substance Use Topics  . Smoking status: Former Smoker    Packs/day: 0.05    Years: 30.00     Types: Cigarettes  . Smokeless tobacco: Never Used     Comment: 01/19/2013 "quit smoking cigarettes in the early '90's"  . Alcohol use Yes     Comment: 01/19/2013 "drank years ago; last drink 03/2012"     Allergies   Morphine and related   Review of Systems Review of Systems  Constitutional: Positive for fatigue and fever. Negative for appetite change, chills and diaphoresis.  HENT: Positive for sore throat. Negative for mouth sores and trouble swallowing.   Eyes: Negative for visual disturbance.  Respiratory: Negative for cough, chest tightness, shortness of breath and wheezing.   Cardiovascular: Negative for chest pain.  Gastrointestinal: Positive for nausea and vomiting. Negative for abdominal distention, abdominal pain and diarrhea.  Endocrine: Negative for polydipsia, polyphagia and polyuria.  Genitourinary: Negative for dysuria, frequency and hematuria.  Musculoskeletal: Negative for gait problem.  Skin: Negative for color change, pallor and rash.  Neurological: Positive for headaches. Negative for dizziness, syncope and light-headedness.  Hematological: Does not bruise/bleed easily.  Psychiatric/Behavioral: Negative for behavioral problems and confusion.     Physical Exam Updated Vital Signs BP 154/79   Pulse 81   Temp 99.1 F (37.3 C) (Oral)   Resp 20   Ht 5\' 3"  (1.6 m)   Wt 269 lb (122 kg)   SpO2 92%   BMI 47.65 kg/m   Physical Exam  Constitutional: She is oriented to person, place, and time. She appears well-developed and well-nourished. No distress.  HENT:  Head: Normocephalic.  Pharynx benign. No exudate.  Eyes: Conjunctivae are normal. Pupils are equal, round, and reactive to light. No scleral icterus.  Neck: Normal range of motion. Neck supple. No thyromegaly present.  Cardiovascular: Normal rate and regular rhythm.  Exam reveals no gallop and no friction rub.   No murmur heard. Pulmonary/Chest: Effort normal and breath sounds normal. No respiratory  distress. She has no wheezes. She has no rales.  Clear bilateral breath sounds.  Abdominal: Soft. Bowel sounds are normal. She exhibits no distension. There is no tenderness. There is no rebound.  Musculoskeletal: Normal range of motion.  Neurological: She is alert and oriented to person, place, and time.  Skin: Skin is warm and dry. No rash noted.  Psychiatric:  She has a normal mood and affect. Her behavior is normal.     ED Treatments / Results  Labs (all labs ordered are listed, but only abnormal results are displayed) Labs Reviewed  COMPREHENSIVE METABOLIC PANEL - Abnormal; Notable for the following:       Result Value   Glucose, Bld 168 (*)    BUN 21 (*)    Creatinine, Ser 1.26 (*)    AST 44 (*)    GFR calc non Af Amer 45 (*)    GFR calc Af Amer 53 (*)    All other components within normal limits  CBC WITH DIFFERENTIAL/PLATELET - Abnormal; Notable for the following:    RBC 3.80 (*)    Hemoglobin 11.6 (*)    Platelets 127 (*)    Lymphs Abs 0.4 (*)    All other components within normal limits  URINALYSIS, ROUTINE W REFLEX MICROSCOPIC - Abnormal; Notable for the following:    Hgb urine dipstick SMALL (*)    Protein, ur >=300 (*)    Squamous Epithelial / LPF 0-5 (*)    All other components within normal limits  RAPID STREP SCREEN (NOT AT [facility])  CULTURE, GROUP A STREP ([place])  I-STAT CG4 LACTIC ACID, ED  I-STAT CG4 LACTIC ACID, ED    EKG  EKG Interpretation None       Radiology Dg Chest 2 View  Result Date: 05/04/2016 CLINICAL DATA:  Patient with productive cough, fever and headache. EXAM: CHEST  2 VIEW COMPARISON:  Chest radiograph 09/13/2015. FINDINGS: Stable cardiomegaly. Calcification of the thoracic aorta. No consolidative pulmonary opacities. Chronic interstitial opacities. No pleural effusion or pneumothorax. Mid thoracic spine degenerative changes. IMPRESSION: No active cardiopulmonary disease.  Chronic interstitial opacities. Electronically Signed   By: Lovey Newcomer M.D.   On: 05/04/2016 15:10    Procedures Procedures (including critical care time)  Medications Ordered in ED Medications  albuterol (PROVENTIL) (2.5 MG/3ML) 0.083% nebulizer solution 2.5 mg (2.5 mg Nebulization Given 05/04/16 1726)  ondansetron (ZOFRAN) injection 4 mg (4 mg Intravenous Given 05/04/16 1726)  sodium chloride 0.9 % bolus 1,000 mL (1,000 mLs Intravenous New Bag/Given 05/04/16 1733)  benzonatate (TESSALON) capsule 200 mg (200 mg Oral Given 05/04/16 1730)     Initial Impression / Assessment and Plan / ED Course  I have reviewed the triage vital signs and the nursing notes.  Pertinent labs & imaging results that were available during my care of the patient were reviewed by me and considered in my medical decision making (see chart for details).     Creatinine at baseline. X-ray without new or focal abnormalities are new infiltrates. Likely viral syndrome possible influenza. We'll rehydrate. Antiemetics. Cough suppressants. Nebulizer. Will reassess.  Final Clinical Impressions(s) / ED Diagnoses   Final diagnoses:  Influenza-like illness  Influenza    On recheck patient 91%. Still with coughing. Given additional neb enforcement with ambulation is in the low 80s and symptomatic. At rest is 80% requiring 2 L nasal cannula. Discussed with family medicine residents. Will admit for hypoxemia. Influenza swab pending.   New Prescriptions New Prescriptions   BENZONATATE (TESSALON) 100 MG CAPSULE    Take 1 capsule (100 mg total) by mouth every 8 (eight) hours.   CHLORPHENIRAMINE-HYDROCODONE (TUSSIONEX PENNKINETIC ER) 10-8 MG/5ML SUER    Take 5 mLs by mouth 2 (two) times daily.   ONDANSETRON (ZOFRAN ODT) 4 MG DISINTEGRATING TABLET    Take 1 tablet (4 mg total) by mouth every 8 (eight) hours as needed  for nausea.   OSELTAMIVIR (TAMIFLU) 75 MG CAPSULE    Take 1 capsule (75 mg total) by mouth every 12 (twelve) hours.     Tanna Furry, MD 05/04/16 1638    Tanna Furry,  MD 05/04/16 2103

## 2016-05-04 NOTE — ED Notes (Signed)
Pt reports after cough med, cough is not as frequent but still very "hard".

## 2016-05-04 NOTE — ED Notes (Signed)
Pt stated she had new onset of sharp left arm pain going down her arm, and feeling lightheaded.

## 2016-05-04 NOTE — ED Notes (Signed)
Pt desat to 81%. C/o SOB. Pt repositioned with no relief. O2 [place] applied at 2L. MD made aware. Of changes in vital signs.

## 2016-05-04 NOTE — ED Notes (Signed)
O2 sats on room air remain between 87-92%.  Dr. Jeneen Rinks made aware, orders obtained.

## 2016-05-04 NOTE — ED Notes (Signed)
Pt room air saturations at 87% placed on 2L

## 2016-05-04 NOTE — H&P (Signed)
[facility] Admission History and Physical Service Pager: [phone]  Patient name: [name] Medical record number: [id number] Date of birth: [birth year] Age: 61 y.o. Gender: female  Primary Care Provider: [name], MD Consultants: none Code Status: FULL  Chief Complaint: shortness of breath  Assessment and Plan: [name] is a 61 y.o. female presenting with shortness of breath. 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   Acute Hypoxic Respiratory Failure 2/2 influenza. Multifactorial given h/o CHF, COPD, OSA. Most likely viral given 3 days of fever, cough and congestion at home with slight improvement today; found to be influenza positive. T max in ED 100.5F without leukocytosis. Lactic acid 1.28 WNL. qSOFA for RR>22. Had hypoxia to 81% in ED, now on 1.5L O2 nasal cannula. Unlikely to be PE given has IVC filter and on full dose anticoagulation with warfarin. Also unlikely PNA given CXR neg for acute abnormalities. Differential includes CHF exacerbation but patient denies LE edema/orthopnea, endorses intentional 19lb weight loss from fluid retention over the last month, no JVD on exam, BNP pending. Has COPD on problem list though does not appear to be on any inhalers at home and although has dyspnea and cough with sputum production, denies increased purulence. Unlikely ACS given atypical chest pain with coughing though troponin increased at 0.10 with EKG pending. - Place in observation, attending Dr. Gwendlyn Deutscher  - tamiflu 75mg  BID for 5 days - monitor on telemetry  - albuterol neb q1prn - BNP pending - EKG pending - Trend troponins - monitor off antibiotics - wean O2 as tolerated - CPAP qhs  HFpEF/HTN. Echo 09/23/15 with EF 55-60%, G3DD. Does not appear to be in acute exacerbation since denies orthopnea/LE edema, no JVD.  - BNP  pending - continue home lasix 80mg  qd  - Continue imdur 120mg  24hr tablet qd, hydralazine 25mg  TID - Consider IV diureses while admitted if symptoms not improving (patient up about 7-8 pounds since her last weight in the chart 6 months ago - unclear if this is due to fluid or not) - Strict I/Os, daily weights  Atypical Chest Pain in setting of cough. Unlikely to be ACS but troponin 0.10, EKG pending. Has multiple risk factors including HTN, HLD, DM and HEART score of  - monitor on telemetry - trend troponin - EKG pending  Atrial fibrillation on warfarin - PT/INR pending - continue warfarin   IDDM. Home regimen of levemir 24U daily, novolog 6-10U with meals - Lantus 12U qd - mSSI - monitor CBGs  History of PEx3 on chronic anticoagulation. - Wafarin per pharmacy  Hypothyroidism -continue home levothyroxine 50 mcg qd  HLD not on statin at home - lipid panel pending  H/o OSA - CPAP qhs  H/o gout - continue home allopurinol 300mg    FEN/GI: heart healthy/carb modified diet Prophylaxis: on warfarin  Disposition: pending  History of Present Illness:  [name] is a 61 y.o. female presenting with cough and shortness of breath.  Symptoms started two days ago with cough, wheeze,and fevers with highest fever of 100.70F. She also have several episodes of post-tussive emesis with occasional few streaks of blood in her sputum, sputum is otherwise whitish-yellow in color. She has had limited PO intake over the past few days. Symptoms are a little better since coming to the hospital. Denies no sick contacts.   Also has some chest pain and  pressure over the last couple of days that she described as like "someone sitting on your chest." She denies any chest pain currently, but does have more chest pain with cough.   She has been able to tolerate lying flat without any significant shortness of breath. Denies swelling in her legs, wears compression hose at home. States has had  intentional 19lb weight loss over the last month "from fluid" when she had a CHF exacerbation.   Review Of Systems: Per HPI with the following additions:  Review of Systems  Constitutional: Positive for chills, fever and weight loss.  HENT: Positive for congestion. Negative for sore throat.   Respiratory: Positive for cough, sputum production, shortness of breath and wheezing.   Cardiovascular: Positive for chest pain and palpitations. Negative for orthopnea, leg swelling and PND.  Gastrointestinal: Positive for vomiting. Negative for abdominal pain, blood in stool, constipation, diarrhea, melena and nausea.  Genitourinary: Negative for dysuria, frequency, hematuria and urgency.  Musculoskeletal: Positive for neck pain. Negative for myalgias.    Patient Active Problem List   Diagnosis Date Noted  . Influenza   . Influenza-like illness   . Hypoxemia 05/04/2016  . Hypoxia 05/04/2016  . CHF (congestive heart failure) ([place]) 09/12/2015  . COPD exacerbation ([place]) 09/12/2015  . Personal history of endocrine, metabolic or immunity disorder 04/03/2015  . Decreased mobility 04/03/2015  . DVT (deep venous thrombosis), bilateral 02/24/2015  . CKD (chronic kidney disease) stage 3, GFR 30-59 ml/min 02/24/2015  . Long-term (current) use of anticoagulants 11/19/2014  . Osteoarthritis 08/31/2014  . Cellulitis of hand, right 08/31/2014  . Tremors  05/07/2012  . Hypoglycemia 05/07/2012  . Abdominal pain 05/04/2012  . Sleep apnea, has not started titration  05/03/2012  . UTI (lower urinary tract infection) 05/03/2012  . UTI (urinary tract infection) 05/03/2012  . DVT, bil in 08/2011 09/03/2011  . Anticoagulated on Coumadin, chronically 09/03/2011  . 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 Rectal bleeding 08/16/2011  . Diastolic dysfunction, grade 2, EF 65-70% May 2013 08/14/2011  . LBBB (left bundle branch block) 08/14/2011  . Chronic renal  insufficiency, stage IV (severe) - due to DM & HTN, SCr improving-1.6 08/14/2011    Class: Chronic  . Angina pectoris ([place]) 08/13/2011  . IDDM (insulin dependent diabetes mellitus) ([place]) 08/13/2011  . Morbid obesity, + sleep apnea 08/13/2011  . HTN (hypertension) 08/13/2011  . HLD (hyperlipidemia) 08/13/2011  . Anemia - due to GI bleed & chronic disease. 08/13/2011  . History of pulmonary embolism, recurrent, 3 seperate episodes. 08/13/2011  . Presence of IVC filter, placed June 2011 after 3d PE 08/13/2011  . Pulmonary HTN, Moderate  PA pressure 2mmHG 09/2009 08/13/2011  . Sinus bradycardia 08/13/2011    Past Medical History: Past Medical History:  Diagnosis Date  . A-fib ([place])   . Anemia   . Anxiety   . Arthritis    "right hip; both knees; left wrist/shoulder; back" (01/19/2013"  . Bleeding on Coumadin 08/2012; 01/18/2013   [facility] admissions (01/19/2013)  . CHF (congestive heart failure) ([place])    "2-3 times" (01/19/2013)  . Chronic lower back pain   . Depression   . DVT (deep venous thrombosis) ([place]) 10 years ago   numerous/notes 01/18/2013  . GERD (gastroesophageal reflux disease)   . Gout   . ZHGDJMEQ(683.4)    "maybe weekly" (01/19/2013)  . Heart murmur   . High cholesterol    "been off RX for this at one time" (01/18/2013)  .  History of blood transfusion 1983; 04/2012   "3 w/ childbirth; hospitalized for pain" (01/19/2013)  . Hypertension   . Hypothyroidism   . Migraines    "twice/yr maybe" (01/19/2013)  . OSA (obstructive sleep apnea)    "sent me for test in 04/2012; never ordered mask, etc" (01/19/2013)  . PE (pulmonary thromboembolism) ([place]) 3 years ag0   3/notes 01/18/2013  . Pneumonia before 2011   "once' (01/18/2013)  . Renal disorder    kindey function low; "Metformin was destroying my kidneys" (01/19/2013)  . Shortness of breath    "only related to my CHF" (01/18/2013)  . Swelling of hand 08/31/2014   RT HAND  . Type II diabetes mellitus ([place])   . UTI  (urinary tract infection) 08/31/2014    Past Surgical History: Past Surgical History:  Procedure Laterality Date  . CARDIAC CATHETERIZATION N/A 03/13/2015   Procedure: Right/Left Heart Cath and Coronary Angiography;  Surgeon: Adrian Prows, MD;  Location: [facility];  Service: Cardiovascular;  Laterality: N/A;  . CATARACT EXTRACTION W/ INTRAOCULAR LENS  IMPLANT, BILATERAL Bilateral 2006-2011  . CESAREAN SECTION  1983  . CHOLECYSTECTOMY  ~ 2002  . COLONOSCOPY N/A 01/21/2013   Procedure: COLONOSCOPY;  Surgeon: Beryle Beams, MD;  Location: [facility];  Service: Endoscopy;  Laterality: N/A;  . EYE SURGERY Bilateral    "multiple" (01/18/2013)  . PARS PLANA REPAIR OF RETINAL DEATACHMENT Right   . PARS PLANA VITRECTOMY Bilateral 2004-2006   "several" (01/18/2013)  . REFRACTIVE SURGERY Bilateral    "for stigmatism" (01/18/2013)  . REFRACTIVE SURGERY Left ~ 11/2012   "to puff it up cause vision got hazy" (01/18/2013)  . VENA CAVA FILTER PLACEMENT  2011?    Social History: Social History  Substance Use Topics  . Smoking status: Former Smoker    Packs/day: 0.05    Years: 30.00    Types: Cigarettes  . Smokeless tobacco: Never Used     Comment: 01/19/2013 "quit smoking cigarettes in the early '90's"  . Alcohol use Yes     Comment: 01/19/2013 "drank years ago; last drink 03/2012"   Additional social history: Smoked marijuana in the 1970s. Denies EtOH or other recreational drug use.  Please also refer to relevant sections of EMR.  Family History: Family History  Problem Relation Age of Onset  . Cerebral aneurysm Mother   . Hypertension Father   . Cerebral aneurysm Maternal Grandfather   . Cerebral aneurysm Maternal Aunt   . Cancer Maternal Uncle    Allergies and Medications: Allergies  Allergen Reactions  . Morphine And Related Rash   No current facility-administered medications on file prior to encounter.    Current Outpatient Prescriptions on File Prior to Encounter   Medication Sig Dispense Refill  . allopurinol (ZYLOPRIM) 300 MG tablet Take 300 mg by mouth daily.    Marland Kitchen aspirin EC 81 MG tablet Take 81 mg by mouth daily.    . citalopram (CELEXA) 20 MG tablet Take 20 mg by mouth at bedtime.    Marland Kitchen HYDROcodone-acetaminophen (NORCO/VICODIN) 5-325 MG per tablet Take 1-2 tablets by mouth every 6 (six) hours as needed for severe pain. (Patient taking differently: Take 0.5-1 tablets by mouth 2 (two) times daily as needed for severe pain. ) 30 tablet 0  . insulin aspart (NOVOLOG) 100 UNIT/ML injection Inject 2 Units into the skin 3 (three) times daily with meals. (Patient taking differently: Inject 6-10 Units into the skin 3 (three) times daily with meals. ) 10 mL 11  .  levothyroxine (SYNTHROID, LEVOTHROID) 50 MCG tablet Take 50 mcg by mouth daily before breakfast.     . Multiple Vitamin (MULITIVITAMIN WITH MINERALS) TABS Take 1 tablet by mouth daily.    Marland Kitchen warfarin (COUMADIN) 2.5 MG tablet TAKE 7.5 MG ON M-W-F AND 5 MG TUES, THURS, SAT AND SUN AS DIRECTED (Patient taking differently: TAKE 7.5 MG ON MON, WED, FRI, SAT AND TAKE 5 MG SUN, TUE, THU - AT 4PM - OR AS DIRECTED) 120 tablet 1  . ferrous gluconate (FERGON) 324 MG tablet Take 1 tablet (324 mg total) by mouth 2 (two) times daily with a meal. (Patient not taking: Reported on 05/04/2016) 60 tablet 3  . isosorbide-hydrALAZINE (BIDIL) 20-37.5 MG tablet Take 1 tablet by mouth 3 (three) times daily. (Patient not taking: Reported on 05/04/2016) 90 tablet 0  . torsemide (DEMADEX) 10 MG tablet Take 1 tablet (10 mg total) by mouth 2 (two) times daily. (Patient not taking: Reported on 05/04/2016) 60 tablet 0    Objective: BP 139/63 (BP Location: Left Arm)   Pulse 73   Temp 98.2 F (36.8 C) (Oral)   Resp (!) 22   Ht 5\' 3"  (1.6 m)   Wt 270 lb 1 oz (122.5 kg)   SpO2 95%   BMI 47.84 kg/m  Exam: General: Lying in bed comfortably with nasal cannula in place. In no distress Eyes: EOMI, conjunctiva normal ENTM: MMM Neck:  supple Cardiovascular: regular rate, S1 and S2 with 2/6 systolic murmur. No rubs/gallops. Respiratory: clear to ausculation b/l with diminished sounds. No wheezes or rhonchi. Speaking in full sentences comfortably while on 1.5L O2 via nasal cannula Gastrointestinal: Obese, soft, nontender, nondistended + bowel sounds MSK: moving limbs spontaneously, good ROM Derm: warm and dry with LE chronic venous changes Neuro: alert and oriented, no focal deficits Psych: appropriate affect  Labs and Imaging: CBC BMET   Recent Labs Lab 05/05/16 0445  WBC 4.4  HGB 10.7*  HCT 34.6*  PLT 121*    Recent Labs Lab 05/05/16 0445  NA 137  K 4.0  CL 101  CO2 25  BUN 26*  CREATININE 1.48*  GLUCOSE 217*  CALCIUM 9.1     Influenza A positive Lactic acid 1.14> 1.28 Troponin 0.10  Urinalysis    Component Value Date/Time   COLORURINE YELLOW 05/04/2016 [place] 05/04/2016 1632   LABSPEC 1.016 05/04/2016 1632   PHURINE 6.0 05/04/2016 1632   GLUCOSEU NEGATIVE 05/04/2016 1632   HGBUR SMALL (A) 05/04/2016 1632   BILIRUBINUR NEGATIVE 05/04/2016 1632   KETONESUR NEGATIVE 05/04/2016 1632   PROTEINUR >=300 (A) 05/04/2016 1632   UROBILINOGEN 0.2 08/31/2014 1158   NITRITE NEGATIVE 05/04/2016 [place] 05/04/2016 1632      Dg Chest 2 View  Result Date: 05/04/2016 CLINICAL DATA:  Patient with productive cough, fever and headache. EXAM: CHEST  2 VIEW COMPARISON:  Chest radiograph 09/13/2015. FINDINGS: Stable cardiomegaly. Calcification of the thoracic aorta. No consolidative pulmonary opacities. Chronic interstitial opacities. No pleural effusion or pneumothorax. Mid thoracic spine degenerative changes. IMPRESSION: No active cardiopulmonary disease.  Chronic interstitial opacities. Electronically Signed   By: Lovey Newcomer M.D.   On: 05/04/2016 15:10    Vivi Barrack, MD 05/05/2016, 7:19 AM PGY-1, [facility] Intern pager: [phone], text  pages welcome  UPPER LEVEL ADDENDUM  I have read the above note and made revisions highlighted in blue.  Algis Greenhouse. Jerline Pain, [facility] Resident PGY-3 05/05/2016 7:19 AM

## 2016-05-04 NOTE — ED Triage Notes (Signed)
Pt. Stated, I can't stop coughing, chills with fever. N/ V this started on Friday. On Saturday it was full blown fly with flu. My throat and ear hurting.

## 2016-05-04 NOTE — ED Notes (Signed)
MD at bedside. 

## 2016-05-04 NOTE — Discharge Instructions (Addendum)
Ms. Dicesare,  You were admitted for respiratory distress from flu and being a little volume overloaded. Please continue tamiflu 30 mg for 2 more doses.   Your echocardiogram showed a normal ejection fraction (amount of blood your heart pumps with each beat) but some thickened muscle. This is likely due chronic high blood pressure.  Please follow-up with your PCP for INR check. Continue lovenox for 5 days, as you are subtherapeutic on coumadin at time of discharge from hospital. You were given a higher dose of coumadin (10 mg) prior to discharge. Continue your normal dosing schedule tomorrow, Friday (05/09/16).  Would take tessalon perles and over-the-counter dextromethorphan with low sugar for diabetics for cough.   Thank you for letting us take part in your care.  Information on my medicine - Coumadin   (Warfarin)  This medication education was reviewed with me or my healthcare representative as part of my discharge preparation.  The pharmacist that spoke with me during my hospital stay was:  Carlean Jews, [facility]  Why was Coumadin prescribed for you? Coumadin was prescribed for you because you have a blood clot or a medical condition that can cause an increased risk of forming blood clots. Blood clots can cause serious health problems by blocking the flow of blood to the heart, lung, or brain. Coumadin can prevent harmful blood clots from forming. As a reminder your indication for Coumadin is:   Stroke Prevention Because Of Atrial Fibrillation  What test will check on my response to Coumadin? While on Coumadin (warfarin) you will need to have an INR test regularly to ensure that your dose is keeping you in the desired range. The INR (international normalized ratio) number is calculated from the result of the laboratory test called prothrombin time (PT).  If an INR APPOINTMENT HAS NOT ALREADY BEEN MADE FOR YOU please schedule an appointment to have this lab work done by your health care  provider within 7 days. Your INR goal is usually a number between:  2 to 3 or your provider may give you a more narrow range like 2-2.5.  Ask your health care provider during an office visit what your goal INR is.  What  do you need to  know  About  COUMADIN? Take Coumadin (warfarin) exactly as prescribed by your healthcare provider about the same time each day.  DO NOT stop taking without talking to the doctor who prescribed the medication.  Stopping without other blood clot prevention medication to take the place of Coumadin may increase your risk of developing a new clot or stroke.  Get refills before you run out.  What do you do if you miss a dose? If you miss a dose, take it as soon as you remember on the same day then continue your regularly scheduled regimen the next day.  Do not take two doses of Coumadin at the same time.  Important Safety Information A possible side effect of Coumadin (Warfarin) is an increased risk of bleeding. You should call your healthcare provider right away if you experience any of the following: ? Bleeding from an injury or your nose that does not stop. ? Unusual colored urine (red or dark brown) or unusual colored stools (red or black). ? Unusual bruising for unknown reasons. ? A serious fall or if you hit your head (even if there is no bleeding).  Some foods or medicines interact with Coumadin (warfarin) and might alter your response to warfarin. To help avoid this: ? Eat a  balanced diet, maintaining a consistent amount of Vitamin K. ? Notify your provider about major diet changes you plan to make. ? Avoid alcohol or limit your intake to 1 drink for women and 2 drinks for men per day. (1 drink is 5 oz. wine, 12 oz. beer, or 1.5 oz. liquor.)  Make sure that ANY health care provider who prescribes medication for you knows that you are taking Coumadin (warfarin).  Also make sure the healthcare provider who is monitoring your Coumadin knows when you have started  a new medication including herbals and non-prescription products.  Coumadin (Warfarin)  Major Drug Interactions  Increased Warfarin Effect Decreased Warfarin Effect  Alcohol (large quantities) Antibiotics (esp. Septra/Bactrim, Flagyl, Cipro) Amiodarone (Cordarone) Aspirin (ASA) Cimetidine (Tagamet) Megestrol (Megace) NSAIDs (ibuprofen, naproxen, etc.) Piroxicam (Feldene) Propafenone (Rythmol SR) Propranolol (Inderal) Isoniazid (INH) Posaconazole (Noxafil) Barbiturates (Phenobarbital) Carbamazepine (Tegretol) Chlordiazepoxide (Librium) Cholestyramine (Questran) Griseofulvin Oral Contraceptives Rifampin Sucralfate (Carafate) Vitamin K   Coumadin (Warfarin) Major Herbal Interactions  Increased Warfarin Effect Decreased Warfarin Effect  Garlic Ginseng Ginkgo biloba Coenzyme Q10 Green tea St. Johns wort    Coumadin (Warfarin) FOOD Interactions  Eat a consistent number of servings per week of foods HIGH in Vitamin K (1 serving =  cup)  Collards (cooked, or boiled & drained) Kale (cooked, or boiled & drained) Mustard greens (cooked, or boiled & drained) Parsley *serving size only =  cup Spinach (cooked, or boiled & drained) Swiss chard (cooked, or boiled & drained) Turnip greens (cooked, or boiled & drained)  Eat a consistent number of servings per week of foods MEDIUM-HIGH in Vitamin K (1 serving = 1 cup)  Asparagus (cooked, or boiled & drained) Broccoli (cooked, boiled & drained, or raw & chopped) Brussel sprouts (cooked, or boiled & drained) *serving size only =  cup Lettuce, raw (green leaf, endive, romaine) Spinach, raw Turnip greens, raw & chopped   These websites have more information on Coumadin (warfarin):  FailFactory.se; VeganReport.com.au;

## 2016-05-04 NOTE — ED Triage Notes (Signed)
EMS stated, flu like symptoms, with cough, N/V. For 3 days. Feels like she's dying

## 2016-05-05 DIAGNOSIS — R0902 Hypoxemia: Secondary | ICD-10-CM | POA: Diagnosis not present

## 2016-05-05 DIAGNOSIS — I2729 Other secondary pulmonary hypertension: Secondary | ICD-10-CM | POA: Diagnosis present

## 2016-05-05 DIAGNOSIS — R69 Illness, unspecified: Secondary | ICD-10-CM

## 2016-05-05 DIAGNOSIS — R0789 Other chest pain: Secondary | ICD-10-CM | POA: Diagnosis not present

## 2016-05-05 DIAGNOSIS — K219 Gastro-esophageal reflux disease without esophagitis: Secondary | ICD-10-CM | POA: Diagnosis present

## 2016-05-05 DIAGNOSIS — J111 Influenza due to unidentified influenza virus with other respiratory manifestations: Secondary | ICD-10-CM

## 2016-05-05 DIAGNOSIS — Z7901 Long term (current) use of anticoagulants: Secondary | ICD-10-CM | POA: Diagnosis not present

## 2016-05-05 DIAGNOSIS — E1121 Type 2 diabetes mellitus with diabetic nephropathy: Secondary | ICD-10-CM | POA: Diagnosis present

## 2016-05-05 DIAGNOSIS — Z885 Allergy status to narcotic agent status: Secondary | ICD-10-CM | POA: Diagnosis not present

## 2016-05-05 DIAGNOSIS — I130 Hypertensive heart and chronic kidney disease with heart failure and stage 1 through stage 4 chronic kidney disease, or unspecified chronic kidney disease: Secondary | ICD-10-CM | POA: Diagnosis present

## 2016-05-05 DIAGNOSIS — E860 Dehydration: Secondary | ICD-10-CM | POA: Diagnosis present

## 2016-05-05 DIAGNOSIS — J101 Influenza due to other identified influenza virus with other respiratory manifestations: Secondary | ICD-10-CM | POA: Diagnosis present

## 2016-05-05 DIAGNOSIS — Z87891 Personal history of nicotine dependence: Secondary | ICD-10-CM | POA: Diagnosis not present

## 2016-05-05 DIAGNOSIS — I509 Heart failure, unspecified: Secondary | ICD-10-CM | POA: Diagnosis not present

## 2016-05-05 DIAGNOSIS — E1122 Type 2 diabetes mellitus with diabetic chronic kidney disease: Secondary | ICD-10-CM | POA: Diagnosis present

## 2016-05-05 DIAGNOSIS — E1165 Type 2 diabetes mellitus with hyperglycemia: Secondary | ICD-10-CM | POA: Diagnosis present

## 2016-05-05 DIAGNOSIS — Z7982 Long term (current) use of aspirin: Secondary | ICD-10-CM | POA: Diagnosis not present

## 2016-05-05 DIAGNOSIS — Z79899 Other long term (current) drug therapy: Secondary | ICD-10-CM | POA: Diagnosis not present

## 2016-05-05 DIAGNOSIS — Z794 Long term (current) use of insulin: Secondary | ICD-10-CM | POA: Diagnosis not present

## 2016-05-05 DIAGNOSIS — E785 Hyperlipidemia, unspecified: Secondary | ICD-10-CM | POA: Diagnosis present

## 2016-05-05 DIAGNOSIS — E039 Hypothyroidism, unspecified: Secondary | ICD-10-CM | POA: Diagnosis present

## 2016-05-05 DIAGNOSIS — J9601 Acute respiratory failure with hypoxia: Secondary | ICD-10-CM | POA: Diagnosis present

## 2016-05-05 DIAGNOSIS — J449 Chronic obstructive pulmonary disease, unspecified: Secondary | ICD-10-CM | POA: Diagnosis present

## 2016-05-05 DIAGNOSIS — I5033 Acute on chronic diastolic (congestive) heart failure: Secondary | ICD-10-CM | POA: Diagnosis present

## 2016-05-05 DIAGNOSIS — I4891 Unspecified atrial fibrillation: Secondary | ICD-10-CM | POA: Diagnosis present

## 2016-05-05 DIAGNOSIS — R0602 Shortness of breath: Secondary | ICD-10-CM | POA: Diagnosis present

## 2016-05-05 DIAGNOSIS — Z79891 Long term (current) use of opiate analgesic: Secondary | ICD-10-CM | POA: Diagnosis not present

## 2016-05-05 DIAGNOSIS — Z86711 Personal history of pulmonary embolism: Secondary | ICD-10-CM | POA: Diagnosis not present

## 2016-05-05 DIAGNOSIS — Z6841 Body Mass Index (BMI) 40.0 and over, adult: Secondary | ICD-10-CM | POA: Diagnosis not present

## 2016-05-05 LAB — PROTIME-INR
INR: 1.85
INR: 1.9
Prothrombin Time: 21.6 seconds — ABNORMAL HIGH (ref 11.4–15.2)
Prothrombin Time: 22.1 seconds — ABNORMAL HIGH (ref 11.4–15.2)

## 2016-05-05 LAB — LIPID PANEL
CHOLESTEROL: 150 mg/dL (ref 0–200)
HDL: 53 mg/dL (ref >40)
LDL Cholesterol: 85 mg/dL (ref 0–99)
TRIGLYCERIDES: 61 mg/dL (ref <150)
Total CHOL/HDL Ratio: 2.8 RATIO
VLDL: 12 mg/dL (ref 0–40)

## 2016-05-05 LAB — BASIC METABOLIC PANEL
ANION GAP: 11 (ref 5–15)
BUN: 26 mg/dL — AB (ref 6–20)
CO2: 25 mmol/L (ref 22–32)
Calcium: 9.1 mg/dL (ref 8.9–10.3)
Chloride: 101 mmol/L (ref 101–111)
Creatinine, Ser: 1.48 mg/dL — ABNORMAL HIGH (ref 0.44–1.00)
GFR calc Af Amer: 43 mL/min — ABNORMAL LOW (ref >60)
GFR, EST NON AFRICAN AMERICAN: 37 mL/min — AB (ref >60)
GLUCOSE: 217 mg/dL — AB (ref 65–99)
POTASSIUM: 4 mmol/L (ref 3.5–5.1)
Sodium: 137 mmol/L (ref 135–145)

## 2016-05-05 LAB — GLUCOSE, CAPILLARY
GLUCOSE-CAPILLARY: 174 mg/dL — AB (ref 65–99)
GLUCOSE-CAPILLARY: 122 mg/dL — AB (ref 65–99)
Glucose-Capillary: 139 mg/dL — ABNORMAL HIGH (ref 65–99)
Glucose-Capillary: 147 mg/dL — ABNORMAL HIGH (ref 65–99)

## 2016-05-05 LAB — CBC
HEMATOCRIT: 34.6 % — AB (ref 36.0–46.0)
HEMOGLOBIN: 10.7 g/dL — AB (ref 12.0–15.0)
MCH: 31 pg (ref 26.0–34.0)
MCHC: 30.9 g/dL (ref 30.0–36.0)
MCV: 100.3 fL — AB (ref 78.0–100.0)
Platelets: 121 10*3/uL — ABNORMAL LOW (ref 150–400)
RBC: 3.45 MIL/uL — ABNORMAL LOW (ref 3.87–5.11)
RDW: 15.9 % — AB (ref 11.5–15.5)
WBC: 4.4 10*3/uL (ref 4.0–10.5)

## 2016-05-05 LAB — TROPONIN I
Troponin I: 0.13 ng/mL (ref <0.03)
Troponin I: 0.12 ng/mL (ref <0.03)

## 2016-05-05 LAB — TSH: TSH: 0.892 u[IU]/mL (ref 0.350–4.500)

## 2016-05-05 LAB — BRAIN NATRIURETIC PEPTIDE: B NATRIURETIC PEPTIDE 5: 1007.9 pg/mL — AB (ref 0.0–100.0)

## 2016-05-05 MED ORDER — OSELTAMIVIR PHOSPHATE 30 MG PO CAPS
30.0000 mg | ORAL_CAPSULE | Freq: Two times a day (BID) | ORAL | Status: DC
  Administered 2016-05-05 – 2016-05-08 (×7): 30 mg via ORAL
  Filled 2016-05-05 (×8): qty 1

## 2016-05-05 MED ORDER — FUROSEMIDE 10 MG/ML IJ SOLN
80.0000 mg | Freq: Every day | INTRAMUSCULAR | Status: DC
  Administered 2016-05-05 – 2016-05-06 (×2): 80 mg via INTRAVENOUS
  Filled 2016-05-05 (×2): qty 8

## 2016-05-05 MED ORDER — WARFARIN SODIUM 7.5 MG PO TABS
7.5000 mg | ORAL_TABLET | Freq: Once | ORAL | Status: AC
  Administered 2016-05-05: 7.5 mg via ORAL
  Filled 2016-05-05: qty 1

## 2016-05-05 MED ORDER — INSULIN GLARGINE 100 UNIT/ML ~~LOC~~ SOLN
12.0000 [IU] | Freq: Once | SUBCUTANEOUS | Status: AC
  Administered 2016-05-05: 12 [IU] via SUBCUTANEOUS
  Filled 2016-05-05: qty 0.12

## 2016-05-05 MED ORDER — MENTHOL 3 MG MT LOZG
1.0000 | LOZENGE | OROMUCOSAL | Status: DC | PRN
  Administered 2016-05-05: 3 mg via ORAL
  Filled 2016-05-05: qty 9

## 2016-05-05 MED ORDER — OSELTAMIVIR PHOSPHATE 75 MG PO CAPS
75.0000 mg | ORAL_CAPSULE | Freq: Two times a day (BID) | ORAL | Status: DC
  Administered 2016-05-05: 75 mg via ORAL
  Filled 2016-05-05 (×3): qty 1

## 2016-05-05 MED ORDER — WARFARIN - PHARMACIST DOSING INPATIENT: Freq: Every day | Status: DC

## 2016-05-05 NOTE — Progress Notes (Signed)
ANTICOAGULATION CONSULT NOTE - Initial Consult  Pharmacy Consult for Coumadin  Indication: atrial fibrillation  Allergies  Allergen Reactions  . Morphine And Related Rash    Patient Measurements: Height: 5\' 3"  (160 cm) Weight: 270 lb 1 oz (122.5 kg) IBW/kg (Calculated) : 52.4  Vital Signs: Temp: 99.3 F (37.4 C) (01/28 2236) Temp Source: Oral (01/28 2236) BP: 138/49 (01/28 2236) Pulse Rate: 88 (01/28 2236)  Labs:  Recent Labs  05/04/16 1434 05/04/16 2235  HGB 11.6*  --   HCT 37.6  --   PLT 127*  --   CREATININE 1.26*  --   TROPONINI  --  0.10*    Estimated Creatinine Clearance: 60.3 mL/min (by C-G formula based on SCr of 1.26 mg/dL (H)).   Medical History: Past Medical History:  Diagnosis Date  . A-fib ([place])   . Anemia   . Anxiety   . Arthritis    "right hip; both knees; left wrist/shoulder; back" (01/19/2013"  . Bleeding on Coumadin 08/2012; 01/18/2013   [facility] admissions (01/19/2013)  . CHF (congestive heart failure) ([place])    "2-3 times" (01/19/2013)  . Chronic lower back pain   . Depression   . DVT (deep venous thrombosis) ([place]) 10 years ago   numerous/notes 01/18/2013  . GERD (gastroesophageal reflux disease)   . Gout   . IOXBDZHG(992.4)    "maybe weekly" (01/19/2013)  . Heart murmur   . High cholesterol    "been off RX for this at one time" (01/18/2013)  . History of blood transfusion 1983; 04/2012   "3 w/ childbirth; hospitalized for pain" (01/19/2013)  . Hypertension   . Hypothyroidism   . Migraines    "twice/yr maybe" (01/19/2013)  . OSA (obstructive sleep apnea)    "sent me for test in 04/2012; never ordered mask, etc" (01/19/2013)  . PE (pulmonary thromboembolism) ([place]) 3 years ag0   3/notes 01/18/2013  . Pneumonia before 2011   "once' (01/18/2013)  . Renal disorder    kindey function low; "Metformin was destroying my kidneys" (01/19/2013)  . Shortness of breath    "only related to my CHF" (01/18/2013)  . Swelling of hand 08/31/2014   RT HAND  . Type II diabetes mellitus ([place])   . UTI (urinary tract infection) 08/31/2014    Medications:  Prescriptions Prior to Admission  Medication Sig Dispense Refill Last Dose  . acetaminophen (TYLENOL) 500 MG tablet Take 500 mg by mouth every 6 (six) hours as needed for fever or headache (pain).   05/03/2016 at Unknown time  . allopurinol (ZYLOPRIM) 300 MG tablet Take 300 mg by mouth daily.   05/03/2016 at Unknown time  . aspirin EC 81 MG tablet Take 81 mg by mouth daily.   05/03/2016 at Unknown time  . Biotin 1000 MCG tablet Take 1,000 mcg by mouth See admin instructions. Take 1 tablet (1000 mcg) by mouth every afternoon and evening   05/02/2016  . CALCIUM CITRATE PO Take 600 mg by mouth See admin instructions. Take 1 tablet (600 mg) by mouth every afternoon and evening   05/02/2016  . citalopram (CELEXA) 20 MG tablet Take 20 mg by mouth at bedtime.   05/02/2016  . diazepam (VALIUM) 5 MG tablet Take 5 mg by mouth at bedtime as needed (back spasms).   4 weeks ago  . Ferrous Sulfate Dried (SLOW RELEASE IRON) 45 MG TBCR Take 45 mg by mouth daily.   05/03/2016 at Unknown time  . furosemide (LASIX) 80 MG tablet Take 80 mg by  mouth daily.   05/03/2016 at Unknown time  . hydrALAZINE (APRESOLINE) 25 MG tablet Take 25 mg by mouth 3 (three) times daily.   05/03/2016 at mid afternoon  . HYDROcodone-acetaminophen (NORCO/VICODIN) 5-325 MG per tablet Take 1-2 tablets by mouth every 6 (six) hours as needed for severe pain. (Patient taking differently: Take 0.5-1 tablets by mouth 2 (two) times daily as needed for severe pain. ) 30 tablet 0 few days ago  . insulin aspart (NOVOLOG) 100 UNIT/ML injection Inject 2 Units into the skin 3 (three) times daily with meals. (Patient taking differently: Inject 6-10 Units into the skin 3 (three) times daily with meals. ) 10 mL 11 05/03/2016 at late lunch  . Insulin Detemir (LEVEMIR FLEXTOUCH) 100 UNIT/ML Pen Inject 24 Units into the skin daily before breakfast.   05/03/2016 at  Unknown time  . isosorbide mononitrate (IMDUR) 120 MG 24 hr tablet Take 120 mg by mouth daily.   05/03/2016 at Unknown time  . levothyroxine (SYNTHROID, LEVOTHROID) 50 MCG tablet Take 50 mcg by mouth daily before breakfast.    05/03/2016 at Unknown time  . Melatonin 5 MG TABS Take 5 mg by mouth at bedtime.   05/02/2016  . Multiple Vitamin (MULITIVITAMIN WITH MINERALS) TABS Take 1 tablet by mouth daily.   05/02/2016 at Unknown time  . Omega-3 Fatty Acids (FISH OIL) 1000 MG CAPS Take 1,000 mg by mouth See admin instructions. Take 1 capsule (1000 mg) by mouth every afternoon and evening   05/02/2016  . warfarin (COUMADIN) 2.5 MG tablet TAKE 7.5 MG ON M-W-F AND 5 MG TUES, THURS, SAT AND SUN AS DIRECTED (Patient taking differently: TAKE 7.5 MG ON MON, WED, FRI, SAT AND TAKE 5 MG SUN, TUE, THU - AT 4PM - OR AS DIRECTED) 120 tablet 1 05/03/2016 at 1600  . ferrous gluconate (FERGON) 324 MG tablet Take 1 tablet (324 mg total) by mouth 2 (two) times daily with a meal. (Patient not taking: Reported on 05/04/2016) 60 tablet 3 Not Taking at Unknown time  . isosorbide-hydrALAZINE (BIDIL) 20-37.5 MG tablet Take 1 tablet by mouth 3 (three) times daily. (Patient not taking: Reported on 05/04/2016) 90 tablet 0 Not Taking at Unknown time  . torsemide (DEMADEX) 10 MG tablet Take 1 tablet (10 mg total) by mouth 2 (two) times daily. (Patient not taking: Reported on 05/04/2016) 60 tablet 0 Not Taking at Unknown time    Assessment: 61 yo F presents with flu. Pt on coumadin PTA for afib. No admission INR done yet. Home dose: 7.5mg  daily except for 5mg  on Tues/Thur/Sun - last dose 1/27  Goal of Therapy:  INR 2-3 Monitor platelets by anticoagulation protocol: Yes   Plan:  Daily INR  Sherlon Handing, PharmD, BCPS Clinical pharmacist, pager [phone] 05/05/2016,12:14 AM

## 2016-05-05 NOTE — Progress Notes (Signed)
CRITICAL VALUE ALERT  Critical value received:  Troponin 0.10  Date of notification: 05/04/2016  Time of notification:  2354  Critical value read back:Yes.     Nurse who received alert: Milus Height  MD notified (1st page):  Cornelia Copa  Time of first page:  0005  MD notified (2nd page):  Time of second page:  Responding MD:  Cornelia Copa  Time MD responded:  [phone]

## 2016-05-05 NOTE — Progress Notes (Signed)
Family Medicine Teaching Service Daily Progress Note Intern Pager: [phone]  Patient name: [name] Medical record number: [id number] Date of birth: 07-07-1955 Age: 61 y.o. Gender: female  Primary Care Provider: [name], MD Consultants: none Code Status: FULL  Pt Overview and Major Events to Date:  1/28 Placed in observation for flu  Assessment and Plan: [name] is a 61 y.o. female presenting with shortness of breath. 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   Acute Hypoxic Respiratory Failure 2/2 influenza. Multifactorial from viral, CHF, COPD, OSA. Found to be influenza positive with 3 days of fever, cough and congestion at home. T max in ED 100.24F without leukocytosis. Lactic acid 1.28 WNL. qSOFA for RR>22. Had hypoxia to 81% in ED, placed on O2 nasal cannula. Differential includes CHF exacerbation since BNP 1007.9 with JVD on exam. Per chart review dry weight appears ~262lbs, on admit 270lbs. Unlikely to be PE given has IVC filter and on full dose anticoagulation with warfarin. Also unlikely PNA given CXR neg for acute abnormalities.  Has COPD on problem list though does not appear to be on any inhalers at home and although has dyspnea and cough with sputum production, denies increased purulence. Unlikely ACS given atypical chest pain with coughing though troponin increased at 0.10>0.13>0.12 likely to be due to demand. - tamiflu for 5 days - monitor on telemetry  - albuterol neb q1prn - monitor off antibiotics - wean O2 as tolerated, currently on 1L - CPAP qhs - IV diuresis at lasix 80mg  qd  HFpEF. Possibly in mild exacerbation since BNP 1007.9 and is likely contributing to dyspnea. Echo 09/23/15 with EF 55-60%, G3DD. Per chart review dry weight appears ~262lbs, on admit 270lbs. At home on lasix 80mg  qd though most recent hospitalization in June  2017 reports transitioned to torsemide but patient reports being placed back on lasix recently due to fluid retention. - IV diuresis with lasix IV 80mg  - Continue imdur 120mg  24hr tablet qd, hydralazine 25mg  TID - strict I/Os, daily weights  Atypical Chest Pain in setting of cough. Unlikely to be ACS but troponin 0.10>0.13 likely due to demand. Has multiple risk factors including HTN, HLD, DM and HEART score of  - monitor on telemetry - trend troponin - EKG this am showing afib and old LBBB, no new changes.  Atrial fibrillation on warfarin - monitor PT/INR  - continue warfarin   IDDM. Home regimen of levemir 24U daily, novolog 6-10U with meals - Lantus 12U qd - mSSI - monitor CBGs  History of PEx3 on chronic anticoagulation. - Wafarin per pharmacy  Hypothyroidism -continue home levothyroxine 50 mcg qd  HTN at home on imdur 120mg  24hr tablet qd, hydralazine 25mg  TID - continue home medications  HLD not on statin at home - lipid panel pending  H/o OSA - CPAP qhs  H/o gout - continue home allopurinol 300mg   Disposition: pending medical management  Subjective:  Continue to have cough this morning. Feels short of breath after coughing spells but no CP.  Objective: Temp:  [98.2 F (36.8 C)-100.1 F (37.8 C)] 98.2 F (36.8 C) (01/29 0547) Pulse Rate:  [73-108] 73 (01/29 0547) Resp:  [20-25] 22 (01/29 0547) BP: (138-182)/(49-91) 139/63 (01/29 0547) SpO2:  [81 %-100 %] 95 % (01/29 0547) Weight:  [122 kg (269 lb)-122.5 kg (270 lb 1 oz)] 122.5 kg (270 lb 1 oz) (01/28 2236) Physical  Exam: General: Lying in bed comfortably with nasal cannula in place. In no distress Cardiovascular: regular rate, S1 and S2 with 2/6 systolic murmur. No rubs/gallops. + JVD Respiratory: clear to ausculation b/l with diminished sounds. No wheezes or rhonchi. Speaking in full sentences comfortably while on 1.5L O2 via nasal cannula Abdomen: Obese, soft, nontender, nondistended + bowel  sounds Extremities: LE chronic venous changes, 2+ pitting edema to mid shin  Laboratory:  Recent Labs Lab 05/04/16 1434 05/05/16 0445  WBC 5.2 4.4  HGB 11.6* 10.7*  HCT 37.6 34.6*  PLT 127* 121*    Recent Labs Lab 05/04/16 1434 05/05/16 0445  NA 141 137  K 3.9 4.0  CL 102 101  CO2 26 25  BUN 21* 26*  CREATININE 1.26* 1.48*  CALCIUM 9.7 9.1  PROT 6.5  --   BILITOT 1.1  --   ALKPHOS 70  --   ALT 35  --   AST 44*  --   GLUCOSE 168* 217*   BNP 1007.9  Imaging/Diagnostic Tests: Dg Chest 2 View  Result Date: 05/04/2016 CLINICAL DATA:  Patient with productive cough, fever and headache. EXAM: CHEST  2 VIEW COMPARISON:  Chest radiograph 09/13/2015. FINDINGS: Stable cardiomegaly. Calcification of the thoracic aorta. No consolidative pulmonary opacities. Chronic interstitial opacities. No pleural effusion or pneumothorax. Mid thoracic spine degenerative changes. IMPRESSION: No active cardiopulmonary disease.  Chronic interstitial opacities. Electronically Signed   By: Lovey Newcomer M.D.   On: 05/04/2016 15:10    Leah Lope, DO 05/05/2016, 7:01 AM PGY-1, [place] [facility] Intern pager: [phone], text pages welcome

## 2016-05-05 NOTE — Progress Notes (Signed)
ANTICOAGULATION CONSULT NOTE - Follow Up Consult  Pharmacy Consult for Warfarin Indication: atrial fibrillation  Assessment: 55 yoF admitted 1/28 for influenza with history of Afib on pta warfarin. Home dose is 7.5 mg daily, except 5 mg on Tuesday, Thursday, Sunday. INR on admission was 1.9 (subtherapeutic). Last dose was 1/27.  Today, INR is 1.85 (subtherapeutic); hemoglobin and platelets are low, but stable. No overt bleeding noted.   Goal of Therapy:  INR 2-3 Monitor platelets by anticoagulation protocol: Yes   Plan:  - Warfarin 7.5 mg x 1 dose tonight - Monitor daily INR, CBC as needed, s/sx's of bleeding   Allergies  Allergen Reactions  . Morphine And Related Rash    Patient Measurements: Height: 5\' 3"  (160 cm) Weight: 270 lb 1 oz (122.5 kg) IBW/kg (Calculated) : 52.4  Vital Signs: Temp: 98.2 F (36.8 C) (01/29 0547) Temp Source: Oral (01/29 0547) BP: 139/63 (01/29 0547) Pulse Rate: 73 (01/29 0547)  Labs:  Recent Labs  05/04/16 1434 05/04/16 2235 05/04/16 2334 05/05/16 0445  HGB 11.6*  --   --  10.7*  HCT 37.6  --   --  34.6*  PLT 127*  --   --  121*  LABPROT  --   --  22.1* 21.6*  INR  --   --  1.90 1.85  CREATININE 1.26*  --   --  1.48*  TROPONINI  --  0.10*  --  0.13*    Estimated Creatinine Clearance: 51.3 mL/min (by C-G formula based on SCr of 1.48 mg/dL (H)).   Medications:  Scheduled:  . allopurinol  300 mg Oral Daily  . aspirin EC  81 mg Oral Daily  . citalopram  20 mg Oral QHS  . furosemide  80 mg Oral Daily  . hydrALAZINE  25 mg Oral TID  . insulin aspart  0-15 Units Subcutaneous TID WC  . insulin aspart  0-5 Units Subcutaneous QHS  . insulin glargine  12 Units Subcutaneous QHS  . insulin glargine  12 Units Subcutaneous Once  . isosorbide mononitrate  120 mg Oral Daily  . levothyroxine  50 mcg Oral QAC breakfast  . Melatonin  3 mg Oral QHS  . oseltamivir  30 mg Oral BID  . warfarin  7.5 mg Oral ONCE-1800  . Warfarin - Pharmacist  Dosing Inpatient   Does not apply [id number]    Belia Heman, PharmD PGY1 Pharmacy Resident [phone] (Pager) 05/05/2016 9:49 AM

## 2016-05-05 NOTE — Care Management Obs Status (Signed)
[facility] NOTIFICATION   Patient Details  Name: [name] MRN: [id number] Date of Birth: Aug 18, 1955   Medicare Observation Status Notification Given:  Yes    Orlinda Slomski, Rory Percy, RN 05/05/2016, 4:45 PM

## 2016-05-05 NOTE — Progress Notes (Signed)
   05/05/16 1100  Clinical Encounter Type  Visited With Patient  Visit Type Initial  Referral From Nurse  Consult/Referral To Chaplain    Dropped off AD paperwork page spiritual care when filled out and ready for notary

## 2016-05-06 LAB — HEMOGLOBIN A1C
Hgb A1c MFr Bld: 5 % (ref 4.8–5.6)
Mean Plasma Glucose: 97 mg/dL

## 2016-05-06 LAB — CULTURE, GROUP A STREP (THRC)

## 2016-05-06 LAB — CBC
HEMATOCRIT: 32.2 % — AB (ref 36.0–46.0)
HEMOGLOBIN: 10 g/dL — AB (ref 12.0–15.0)
MCH: 30.9 pg (ref 26.0–34.0)
MCHC: 31.1 g/dL (ref 30.0–36.0)
MCV: 99.4 fL (ref 78.0–100.0)
Platelets: 114 10*3/uL — ABNORMAL LOW (ref 150–400)
RBC: 3.24 MIL/uL — AB (ref 3.87–5.11)
RDW: 15.4 % (ref 11.5–15.5)
WBC: 6.5 10*3/uL (ref 4.0–10.5)

## 2016-05-06 LAB — GLUCOSE, CAPILLARY
GLUCOSE-CAPILLARY: 100 mg/dL — AB (ref 65–99)
GLUCOSE-CAPILLARY: 99 mg/dL (ref 65–99)
Glucose-Capillary: 88 mg/dL (ref 65–99)
Glucose-Capillary: 110 mg/dL — ABNORMAL HIGH (ref 65–99)

## 2016-05-06 LAB — BASIC METABOLIC PANEL
Anion gap: 6 (ref 5–15)
BUN: 39 mg/dL — AB (ref 6–20)
CHLORIDE: 104 mmol/L (ref 101–111)
CO2: 28 mmol/L (ref 22–32)
CREATININE: 1.67 mg/dL — AB (ref 0.44–1.00)
Calcium: 8.6 mg/dL — ABNORMAL LOW (ref 8.9–10.3)
GFR calc Af Amer: 37 mL/min — ABNORMAL LOW (ref >60)
GFR calc non Af Amer: 32 mL/min — ABNORMAL LOW (ref >60)
Glucose, Bld: 92 mg/dL (ref 65–99)
POTASSIUM: 4 mmol/L (ref 3.5–5.1)
Sodium: 138 mmol/L (ref 135–145)

## 2016-05-06 LAB — PROTIME-INR
INR: 1.97
Prothrombin Time: 22.7 seconds — ABNORMAL HIGH (ref 11.4–15.2)

## 2016-05-06 MED ORDER — BENZONATATE 100 MG PO CAPS
100.0000 mg | ORAL_CAPSULE | Freq: Two times a day (BID) | ORAL | Status: DC | PRN
  Administered 2016-05-07: 100 mg via ORAL
  Filled 2016-05-06: qty 1

## 2016-05-06 MED ORDER — WARFARIN SODIUM 7.5 MG PO TABS
7.5000 mg | ORAL_TABLET | Freq: Once | ORAL | Status: AC
  Administered 2016-05-06: 7.5 mg via ORAL
  Filled 2016-05-06: qty 1

## 2016-05-06 MED ORDER — TORSEMIDE 20 MG PO TABS
40.0000 mg | ORAL_TABLET | Freq: Three times a day (TID) | ORAL | Status: DC
  Administered 2016-05-06 – 2016-05-08 (×6): 40 mg via ORAL
  Filled 2016-05-06 (×6): qty 2

## 2016-05-06 MED ORDER — METOLAZONE 5 MG PO TABS
5.0000 mg | ORAL_TABLET | Freq: Once | ORAL | Status: AC
  Administered 2016-05-07: 5 mg via ORAL
  Filled 2016-05-06 (×2): qty 1

## 2016-05-06 NOTE — Progress Notes (Signed)
Family Medicine Teaching Service Daily Progress Note Intern Pager: [phone]  Patient name: [name] Medical record number: [id number] Date of birth: September 08, 1955 Age: 61 y.o. Gender: female  Primary Care Provider: [name], MD Consultants: none Code Status: FULL  Pt Overview and Major Events to Date:  1/28 Placed in observation for flu  Assessment and Plan: [name] is a 61 y.o. female presenting with shortness of breath. 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   Acute Hypoxic Respiratory Failure 2/2 influenza. Multifactorial from viral, CHF, COPD, OSA. Found to be influenza positive with 3 days of fever, cough and congestion at home. T max in ED 100.43F without leukocytosis. Lactic acid 1.28 WNL. qSOFA for RR>22. Had hypoxia to 81% in ED, placed on O2 nasal cannula. Differential includes CHF exacerbation since BNP 1007.9 with JVD on exam. Per chart review dry weight appears ~262lbs, on admit 270lbs. Unlikely to be PE given has IVC filter and on full dose anticoagulation with warfarin. Also unlikely PNA given CXR neg for acute abnormalities.  Has COPD on problem list though does not appear to be on any inhalers at home and although has dyspnea and cough with sputum production, denies increased purulence. Unlikely ACS given atypical chest pain with coughing though troponin increased at 0.10>0.13>0.12 likely to be due to demand. - tamiflu for 5 days - monitor on telemetry  - albuterol neb q1prn - monitor off antibiotics - wean O2 as tolerated, currently on 1L - CPAP qhs - IV diuresis at lasix 80mg  qd  HFpEF with pulm HTN. Possibly in mild exacerbation since BNP 1007.9 and is likely contributing to dyspnea. Echo 09/23/15 with EF 55-60%, G3DD. Cath 03/2015 shows pulmonary HTN. Per chart review dry weight appears ~262lbs, on admit 270lbs. At home on lasix  80mg  qd though most recent hospitalization in June 2017 reports transitioned to torsemide but patient reports being placed back on lasix recently due to fluid retention. - IV diuresis with lasix IV 80mg  seems to be ineffective, per cardiology Dr. Einar Gip will start torsemide 40mg  TID - Cardiology to see, appreciate recommendations.  - Continue imdur 120mg  24hr tablet qd, hydralazine 25mg  TID - strict I/Os, none recorded - daily weights: 276lbs today, up 6 lbs from admission  Possible AKI on CKD On admit 1.26, increasing this hospital stay >1.48>1.67. Difficult baseline given has had creatinine as high as 2.4 with GFR in 20s in the past. Worsening creatinine function possibly due to aggressive diuresis or cardiorenal given weight increase and signs of volume overload. - monitor  Atypical Chest Pain in setting of cough. Unlikely to be ACS but troponin 0.10>0.13>0.12 likely due to demand. Has multiple risk factors including HTN, HLD, DM and HEART score of 5 but EKG showed afib and old LBBB, no new changes. - monitor on telemetry  Atrial fibrillation on warfarin - monitor PT/INR  - continue warfarin   IDDM. Home regimen of levemir 24U daily, novolog 6-10U with meals. a1c 5.0 - Lantus 12U qd - mSSI - monitor CBGs  History of PEx3 on chronic anticoagulation. - Wafarin per pharmacy  Hypothyroidism -continue home levothyroxine 50 mcg qd  HTN at home on imdur 120mg  24hr tablet qd, hydralazine 25mg  TID - continue home medications  HLD not on statin at home. lipid panel: LDL 85, ASCVD 80yr risk 5.8% - start moderate intensity statin  H/o OSA - CPAP qhs  H/o  gout - continue home allopurinol 300mg   Disposition: pending medical management  Subjective:  Continues to have cough this morning but feels in improving, less sputum production. Feels a little improved overall. No SOB.  Objective: Temp:  [98.2 F (36.8 C)-99.1 F (37.3 C)] 99 F (37.2 C) (01/29 2149) Pulse Rate:   [57-78] 57 (01/29 2149) Resp:  [16-22] 18 (01/29 2149) BP: (101-148)/(50-63) 101/58 (01/29 2149) SpO2:  [91 %-96 %] 96 % (01/29 2149) Physical Exam: General: Lying in bed comfortably with nasal cannula in place. In no distress Cardiovascular: regular rate, S1 and S2 with 2/6 systolic murmur. No rubs/gallops. + JVD Respiratory: clear to ausculation b/l with diminished sounds. No wheezes or rhonchi. Speaking in full sentences comfortably while on 1.5L O2 via nasal cannula Abdomen: Obese, soft, nontender, nondistended + bowel sounds Extremities: LE chronic venous changes, 2+ pitting edema to midthigh  Laboratory:  Recent Labs Lab 05/04/16 1434 05/05/16 0445  WBC 5.2 4.4  HGB 11.6* 10.7*  HCT 37.6 34.6*  PLT 127* 121*    Recent Labs Lab 05/04/16 1434 05/05/16 0445  NA 141 137  K 3.9 4.0  CL 102 101  CO2 26 25  BUN 21* 26*  CREATININE 1.26* 1.48*  CALCIUM 9.7 9.1  PROT 6.5  --   BILITOT 1.1  --   ALKPHOS 70  --   ALT 35  --   AST 44*  --   GLUCOSE 168* 217*   BNP 1007.9  Imaging/Diagnostic Tests: No results found.  Bufford Lope, DO 05/06/2016, 2:36 AM PGY-1, [facility] Intern pager: [phone], text pages welcome

## 2016-05-06 NOTE — Progress Notes (Signed)
ANTICOAGULATION CONSULT NOTE - Follow Up Consult  Pharmacy Consult for Warfarin Indication: atrial fibrillation  Assessment: 19 yoF admitted 1/28 for influenza with history of Afib on pta warfarin. Home dose is 7.5 mg daily, except 5 mg on Tuesday, Thursday, Sunday. INR on admission was 1.9 (subtherapeutic). Last pta dose was 1/27, missed dose on 1/28.  Today, INR is 1.97 (slightly subtherapeutic); hemoglobin and platelets are low, but stable. No overt bleeding noted.   Goal of Therapy:  INR 2-3 Monitor platelets by anticoagulation protocol: Yes   Plan:  - Warfarin 7.5 mg x 1 dose tonight - Monitor daily INR, CBC as needed, s/sx's of bleeding   Allergies  Allergen Reactions  . Morphine And Related Rash    Patient Measurements: Height: 5\' 3"  (160 cm) Weight: 276 lb 6.4 oz (125.4 kg) IBW/kg (Calculated) : 52.4  Vital Signs: Temp: 98.6 F (37 C) (01/30 0900) Temp Source: Oral (01/30 0900) BP: 100/43 (01/30 0900) Pulse Rate: 84 (01/30 0900)  Labs:  Recent Labs  05/04/16 1434 05/04/16 2235 05/04/16 2334 05/05/16 0445 05/05/16 1001 05/06/16 0644 05/06/16 1123  HGB 11.6*  --   --  10.7*  --  10.0*  --   HCT 37.6  --   --  34.6*  --  32.2*  --   PLT 127*  --   --  121*  --  114*  --   LABPROT  --   --  22.1* 21.6*  --   --  22.7*  INR  --   --  1.90 1.85  --   --  1.97  CREATININE 1.26*  --   --  1.48*  --  1.67*  --   TROPONINI  --  0.10*  --  0.13* 0.12*  --   --     Estimated Creatinine Clearance: 46.1 mL/min (by C-G formula based on SCr of 1.67 mg/dL (H)).   Medications:  Scheduled:  . allopurinol  300 mg Oral Daily  . aspirin EC  81 mg Oral Daily  . citalopram  20 mg Oral QHS  . hydrALAZINE  25 mg Oral TID  . insulin aspart  0-15 Units Subcutaneous TID WC  . insulin aspart  0-5 Units Subcutaneous QHS  . insulin glargine  12 Units Subcutaneous QHS  . isosorbide mononitrate  120 mg Oral Daily  . levothyroxine  50 mcg Oral QAC breakfast  . Melatonin  3  mg Oral QHS  . oseltamivir  30 mg Oral BID  . torsemide  40 mg Oral TID  . warfarin  7.5 mg Oral ONCE-1800  . Warfarin - Pharmacist Dosing Inpatient   Does not apply [id number]    Belia Heman, PharmD PGY1 Pharmacy Resident [phone] (Pager) 05/06/2016 1:21 PM

## 2016-05-06 NOTE — Progress Notes (Signed)
05/06/2016 12:01 PM  Patient was found in room sitting upright in chair with no oxygen on at this time. NT checked her oxygen saturation on room air which was 88%. Informed MD. MD informed that he would preferred patient to maintain O2 at 92%. Placed patient back on nasal canula at 0.5L. Will continue to assess and monitor.   Alyannah Sanks [employer], RN-BC [employer] Phone [phone]

## 2016-05-06 NOTE — Progress Notes (Signed)
05/06/2016 2:49 PM  Patient complained that her eyes feel very heavy and full of fluid. No reports in vision lost. She is concerned about her current state due to her past history of 10 or more eye surgeries. Informed MD. No new orders at this time. Will continue to assess and monitor the patient.   Constantina Laseter [employer], RN-BC [employer] Phone [phone]

## 2016-05-06 NOTE — Progress Notes (Signed)
Pt states she does not wear CPAP at home.   

## 2016-05-07 LAB — GLUCOSE, CAPILLARY
GLUCOSE-CAPILLARY: 100 mg/dL — AB (ref 65–99)
GLUCOSE-CAPILLARY: 123 mg/dL — AB (ref 65–99)
Glucose-Capillary: 91 mg/dL (ref 65–99)
Glucose-Capillary: 88 mg/dL (ref 65–99)

## 2016-05-07 LAB — BASIC METABOLIC PANEL
ANION GAP: 10 (ref 5–15)
BUN: 47 mg/dL — ABNORMAL HIGH (ref 6–20)
CALCIUM: 8.8 mg/dL — AB (ref 8.9–10.3)
CO2: 31 mmol/L (ref 22–32)
Chloride: 98 mmol/L — ABNORMAL LOW (ref 101–111)
Creatinine, Ser: 1.7 mg/dL — ABNORMAL HIGH (ref 0.44–1.00)
GFR calc Af Amer: 37 mL/min — ABNORMAL LOW (ref >60)
GFR calc non Af Amer: 32 mL/min — ABNORMAL LOW (ref >60)
GLUCOSE: 87 mg/dL (ref 65–99)
POTASSIUM: 4 mmol/L (ref 3.5–5.1)
Sodium: 139 mmol/L (ref 135–145)

## 2016-05-07 LAB — PROTIME-INR
INR: 1.79
PROTHROMBIN TIME: 21 s — AB (ref 11.4–15.2)

## 2016-05-07 MED ORDER — HYDROCODONE-ACETAMINOPHEN 5-325 MG PO TABS
1.0000 | ORAL_TABLET | Freq: Once | ORAL | Status: AC
  Administered 2016-05-07: 1 via ORAL

## 2016-05-07 MED ORDER — WARFARIN SODIUM 7.5 MG PO TABS
7.5000 mg | ORAL_TABLET | Freq: Once | ORAL | Status: AC
  Administered 2016-05-07: 7.5 mg via ORAL
  Filled 2016-05-07: qty 1

## 2016-05-07 MED ORDER — ACETAMINOPHEN 325 MG PO TABS
650.0000 mg | ORAL_TABLET | Freq: Four times a day (QID) | ORAL | Status: DC | PRN
  Administered 2016-05-07 (×2): 650 mg via ORAL
  Filled 2016-05-07 (×2): qty 2

## 2016-05-07 NOTE — Progress Notes (Signed)
Family Medicine Teaching Service Daily Progress Note Intern Pager: [phone]  Patient name: [name] Medical record number: [id number] Date of birth: 10-05-1955 Age: 61 y.o. Gender: female  Primary Care Provider: [name], MD Consultants: none Code Status: FULL  Pt Overview and Major Events to Date:  1/28 Placed in observation for flu  Assessment and Plan: [name] is a 61 y.o. female presenting with shortness of breath. 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   Acute on chronic HFpEF with pulm HTN, improving Possibly in mild exacerbation since BNP 1007.9 and is likely contributing to dyspnea. Echo 09/23/15 with EF 55-60%, G3DD. Cath 03/2015 shows pulmonary HTN. Per chart review dry weight appears ~262lbs, on admit 270lbs. At home on lasix 80mg  qd though most recent hospitalization in June 2017 reports transitioned to torsemide but patient reports being placed back on lasix recently due to fluid retention. - per cardiology Dr. Einar Gip, torsemide 40mg  TID and metolazone x1, appreciate recommendations - echocardiogram pending - Continue imdur 120mg  24hr tablet qd, hydralazine 25mg  TID - strict I/Os: 24hr output 4L - daily weights: 267lbs today, down 2 lbs from admission  Acute Hypoxic Respiratory Failure 2/2 influenza, improving Multifactorial from viral, CHF, COPD, OSA. Found to be influenza positive with 3 days of fever, cough and congestion at home. T max in ED 100.7F without leukocytosis. Lactic acid 1.28 WNL. qSOFA for RR>22. Had hypoxia to 81% in ED, placed on O2 nasal cannula. Differential includes CHF exacerbation since BNP 1007.9 with JVD on exam. Per chart review dry weight appears ~262lbs. Unlikely to be PE given has IVC filter and on full dose anticoagulation with warfarin but was subtherapeutic. Also unlikely PNA given CXR neg for acute  abnormalities.  Has COPD on problem list though does not appear to be on any inhalers at home and although has dyspnea and cough with sputum production, denies increased purulence. Unlikely ACS given atypical chest pain with coughing though troponin increased at 0.10>0.13>0.12 likely to be due to demand. - tamiflu for 5 days - monitor on telemetry  - albuterol neb q1prn - monitor off antibiotics - wean O2 as tolerated, currently on room air - CPAP qhs - diuresis as per above -PT/OT eval, appreciate recommendations  CKD On admit 1.26, increasing this hospital stay >1.48>1.67>1.7. Difficult baseline given has had creatinine as high as 2.4 with GFR in 20s in the past. Worsening creatinine function likely her wandering baseline in the setting of diabetic nephropathy - monitor  Atypical Chest Pain in setting of cough. Unlikely to be ACS but troponin 0.10>0.13>0.12 likely due to demand. Has multiple risk factors including HTN, HLD, DM and HEART score of 5 but EKG showed afib and old LBBB, no new changes. - monitor on telemetry  Atrial fibrillation on warfarin - monitor PT/INR  - continue warfarin   IDDM. Home regimen of levemir 24U daily, novolog 6-10U with meals. a1c 5.0 - Lantus 12U qd - mSSI - monitor CBGs  History of PEx3 on chronic anticoagulation. - Wafarin per pharmacy  Hypothyroidism -continue home levothyroxine 50 mcg qd  HTN at home on imdur 120mg  24hr tablet qd, hydralazine 25mg  TID - continue home medications  HLD not on statin at home. lipid panel: LDL 85, ASCVD 27yr risk 5.8% - start moderate intensity statin  H/o OSA - CPAP qhs  H/o gout - continue home allopurinol 300mg   Disposition: pending medical  management  Subjective:  Feels much improved today. Cough is lessened in frequency, breathing is also better. States has had significant urine output overnight.  Objective: Temp:  [97.6 F (36.4 C)-98.6 F (37 C)] 98.6 F (37 C) (01/31 0924) Pulse  Rate:  [58-79] 72 (01/31 0924) Resp:  [18] 18 (01/31 0924) BP: (123-149)/(52-63) 149/54 (01/31 0924) SpO2:  [94 %-97 %] 94 % (01/31 0924) Weight:  [121.5 kg (267 lb 13.7 oz)-125.8 kg (277 lb 5.4 oz)] 121.5 kg (267 lb 13.7 oz) (01/31 0846) Physical Exam: General: Sitting up in bed comfortably. In no distress Cardiovascular: regular rate, S1 and S2 with 2/6 systolic murmur. No rubs/gallops. Respiratory: clear to ausculation b/l with diminished sounds. No wheezes or rhonchi. Speaking in full sentences comfortably on room air Abdomen: Obese, soft, nontender, nondistended + bowel sounds Extremities: LE chronic venous changes, 1+ pitting edema to midthigh  Laboratory:  Recent Labs Lab 05/04/16 1434 05/05/16 0445 05/06/16 0644  WBC 5.2 4.4 6.5  HGB 11.6* 10.7* 10.0*  HCT 37.6 34.6* 32.2*  PLT 127* 121* 114*    Recent Labs Lab 05/04/16 1434 05/05/16 0445 05/06/16 0644 05/07/16 0648  NA 141 137 138 139  K 3.9 4.0 4.0 4.0  CL 102 101 104 98*  CO2 26 25 28 31   BUN 21* 26* 39* 47*  CREATININE 1.26* 1.48* 1.67* 1.70*  CALCIUM 9.7 9.1 8.6* 8.8*  PROT 6.5  --   --   --   BILITOT 1.1  --   --   --   ALKPHOS 70  --   --   --   ALT 35  --   --   --   AST 44*  --   --   --   GLUCOSE 168* 217* 92 87   BNP 1007.9  Imaging/Diagnostic Tests: No results found.  Bufford Lope, DO 05/07/2016, 9:49 AM PGY-1, [place] Intern pager: [phone], text pages welcome

## 2016-05-07 NOTE — Consult Note (Signed)
CARDIOLOGY CONSULT NOTE  Patient ID: [name] MRN: [id number] DOB/AGE: 1955/09/16 61 y.o.  Admit date: 05/04/2016 Referring Physician Kinnie Feil, MD Primary Physician:  [name], MD Reason for Consultation  CHF  HPI: [name]  is a 61 y.o. female  With  A history of hypertension, hyperlipidemia, diabetes mellitus, chronic diastolic dysfunction, H/O atrial flutter, history of bilateral DVTs and history of pulmonary embolism in May 2013, has a IVC filter in place placed in 2014. She also has stage 3 chronic kidney disease, severe chronic anemia from probable GI bleed and chronic disease. Morbid obesity and history of pulmonary hypertension.  She was admitted to [facility] with florid diastolic heart failure with significant weight gain and 3-4+ edema, worsening dyspnea, fever, chills and productive cough. As she was volume overloaded and heart failure could not be easily controlled in spite of IV diuresis, I was consulted.  Last night and started her on Zaroxolyn.  Dietary changes were made.  I'm seeing her this morning for evaluation of CHF.  Patient states that since last night she has had significant urinary out put, her leg edema has essentially resolved, breathing has improved significantly.  She still has hacking cough and productive sputum which is yellowish.  Since being in the hospital she has not had any further fever or chills which she had her to presentation.  She continues to battle her weight.  Diabetes continues to be uncontrolled.  She has not lost any weight.  She continues to have shortness of breath and dyspnea on exertion that is chronic and no worse.  No hemoptysis.  No recent travel.  Denies any chest pain or palpitations.  Past Medical History:  Diagnosis Date  . A-fib ([place])   . Anemia   . Anxiety   . Arthritis    "right hip; both knees; left wrist/shoulder; back" (01/19/2013"  . Bleeding on Coumadin 08/2012; 01/18/2013   BRBPR  admissions (01/19/2013)  . CHF (congestive heart failure) ([place])    "2-3 times" (01/19/2013)  . Chronic lower back pain   . Depression   . DVT (deep venous thrombosis) ([place]) 10 years ago   numerous/notes 01/18/2013  . GERD (gastroesophageal reflux disease)   . Gout   . EGBTDVVO(160.7)    "maybe weekly" (01/19/2013)  . Heart murmur   . High cholesterol    "been off RX for this at one time" (01/18/2013)  . History of blood transfusion 1983; 04/2012   "3 w/ childbirth; hospitalized for pain" (01/19/2013)  . Hypertension   . Hypothyroidism   . Migraines    "twice/yr maybe" (01/19/2013)  . OSA (obstructive sleep apnea)    "sent me for test in 04/2012; never ordered mask, etc" (01/19/2013)  . PE (pulmonary thromboembolism) ([place]) 3 years ag0   3/notes 01/18/2013  . Pneumonia before 2011   "once' (01/18/2013)  . Renal disorder    kindey function low; "Metformin was destroying my kidneys" (01/19/2013)  . Shortness of breath    "only related to my CHF" (01/18/2013)  . Swelling of hand 08/31/2014   RT HAND  . Type II diabetes mellitus ([place])   . UTI (urinary tract infection) 08/31/2014     Past Surgical History:  Procedure Laterality Date  . CARDIAC CATHETERIZATION N/A 03/13/2015   Procedure: Right/Left Heart Cath and Coronary Angiography;  Surgeon: Adrian Prows, MD;  Location: [facility];  Service: Cardiovascular;  Laterality: N/A;  . CATARACT EXTRACTION W/ INTRAOCULAR LENS  IMPLANT, BILATERAL Bilateral 2006-2011  .  CESAREAN SECTION  1983  . CHOLECYSTECTOMY  ~ 2002  . COLONOSCOPY N/A 01/21/2013   Procedure: COLONOSCOPY;  Surgeon: Beryle Beams, MD;  Location: [place];  Service: Endoscopy;  Laterality: N/A;  . EYE SURGERY Bilateral    "multiple" (01/18/2013)  . PARS PLANA REPAIR OF RETINAL DEATACHMENT Right   . PARS PLANA VITRECTOMY Bilateral 2004-2006   "several" (01/18/2013)  . REFRACTIVE SURGERY Bilateral    "for stigmatism" (01/18/2013)  . REFRACTIVE SURGERY Left ~  11/2012   "to puff it up cause vision got hazy" (01/18/2013)  . VENA CAVA FILTER PLACEMENT  2011?     Family History  Problem Relation Age of Onset  . Cerebral aneurysm Mother   . Hypertension Father   . Cerebral aneurysm Maternal Grandfather   . Cerebral aneurysm Maternal Aunt   . Cancer Maternal Uncle      Social History: Social History   Social History  . Marital status: Single    Spouse name: N/A  . Number of children: N/A  . Years of education: N/A   Occupational History  . Not on file.   Social History Main Topics  . Smoking status: Former Smoker    Packs/day: 0.05    Years: 30.00    Types: Cigarettes  . Smokeless tobacco: Never Used     Comment: 01/19/2013 "quit smoking cigarettes in the early '90's"  . Alcohol use Yes     Comment: 01/19/2013 "drank years ago; last drink 03/2012"  . Drug use: No  . Sexual activity: No   Other Topics Concern  . Not on file   Social History Narrative  . No narrative on file     Prescriptions Prior to Admission  Medication Sig Dispense Refill Last Dose  . acetaminophen (TYLENOL) 500 MG tablet Take 500 mg by mouth every 6 (six) hours as needed for fever or headache (pain).   05/03/2016 at Unknown time  . allopurinol (ZYLOPRIM) 300 MG tablet Take 300 mg by mouth daily.   05/03/2016 at Unknown time  . aspirin EC 81 MG tablet Take 81 mg by mouth daily.   05/03/2016 at Unknown time  . Biotin 1000 MCG tablet Take 1,000 mcg by mouth See admin instructions. Take 1 tablet (1000 mcg) by mouth every afternoon and evening   05/02/2016  . CALCIUM CITRATE PO Take 600 mg by mouth See admin instructions. Take 1 tablet (600 mg) by mouth every afternoon and evening   05/02/2016  . citalopram (CELEXA) 20 MG tablet Take 20 mg by mouth at bedtime.   05/02/2016  . diazepam (VALIUM) 5 MG tablet Take 5 mg by mouth at bedtime as needed (back spasms).   4 weeks ago  . Ferrous Sulfate Dried (SLOW RELEASE IRON) 45 MG TBCR Take 45 mg by mouth daily.   05/03/2016  at Unknown time  . furosemide (LASIX) 80 MG tablet Take 80 mg by mouth daily.   05/03/2016 at Unknown time  . hydrALAZINE (APRESOLINE) 25 MG tablet Take 25 mg by mouth 3 (three) times daily.   05/03/2016 at mid afternoon  . HYDROcodone-acetaminophen (NORCO/VICODIN) 5-325 MG per tablet Take 1-2 tablets by mouth every 6 (six) hours as needed for severe pain. (Patient taking differently: Take 0.5-1 tablets by mouth 2 (two) times daily as needed for severe pain. ) 30 tablet 0 few days ago  . insulin aspart (NOVOLOG) 100 UNIT/ML injection Inject 2 Units into the skin 3 (three) times daily with meals. (Patient taking differently: Inject 6-10 Units into  the skin 3 (three) times daily with meals. ) 10 mL 11 05/03/2016 at late lunch  . Insulin Detemir (LEVEMIR FLEXTOUCH) 100 UNIT/ML Pen Inject 24 Units into the skin daily before breakfast.   05/03/2016 at Unknown time  . isosorbide mononitrate (IMDUR) 120 MG 24 hr tablet Take 120 mg by mouth daily.   05/03/2016 at Unknown time  . levothyroxine (SYNTHROID, LEVOTHROID) 50 MCG tablet Take 50 mcg by mouth daily before breakfast.    05/03/2016 at Unknown time  . Melatonin 5 MG TABS Take 5 mg by mouth at bedtime.   05/02/2016  . Multiple Vitamin (MULITIVITAMIN WITH MINERALS) TABS Take 1 tablet by mouth daily.   05/02/2016 at Unknown time  . Omega-3 Fatty Acids (FISH OIL) 1000 MG CAPS Take 1,000 mg by mouth See admin instructions. Take 1 capsule (1000 mg) by mouth every afternoon and evening   05/02/2016  . warfarin (COUMADIN) 2.5 MG tablet TAKE 7.5 MG ON M-W-F AND 5 MG TUES, THURS, SAT AND SUN AS DIRECTED (Patient taking differently: TAKE 7.5 MG ON MON, WED, FRI, SAT AND TAKE 5 MG SUN, TUE, THU - AT 4PM - OR AS DIRECTED) 120 tablet 1 05/03/2016 at 1600  . ferrous gluconate (FERGON) 324 MG tablet Take 1 tablet (324 mg total) by mouth 2 (two) times daily with a meal. (Patient not taking: Reported on 05/04/2016) 60 tablet 3 Not Taking at Unknown time  . isosorbide-hydrALAZINE  (BIDIL) 20-37.5 MG tablet Take 1 tablet by mouth 3 (three) times daily. (Patient not taking: Reported on 05/04/2016) 90 tablet 0 Not Taking at Unknown time  . torsemide (DEMADEX) 10 MG tablet Take 1 tablet (10 mg total) by mouth 2 (two) times daily. (Patient not taking: Reported on 05/04/2016) 60 tablet 0 Not Taking at Unknown time     ROS: General: has  fevers/chills/night sweats Eyes: no blurry vision, diplopia, or amaurosis ENT: no sore throat or hearing loss Resp:  cough, wheezing CV: edema improved GI: no abdominal pain, nausea, vomiting, diarrhea, or constipation GU: no dysuria, frequency, or hematuria Skin: no rash Neuro: no headache, numbness, tingling, or weakness of extremities Musculoskeletal:has chronic  joint pain  Heme: no bleeding, DVT, or easy bruising Endo: no polydipsia or polyuria    Physical Exam: Blood pressure (!) 149/54, pulse 72, temperature 98.6 F (37 C), temperature source Oral, resp. rate 18, height 5\' 3"  (1.6 m), weight 267 lb 13.7 oz (121.5 kg), SpO2 94 %.   General appearance: alert, cooperative, appears older than stated age, no distress and moderately obese Lungs: clear to auscultation bilaterally and with occasional expiratory wheeze Chest wall: no tenderness Heart: regular rate and rhythm, S1, S2 normal, no murmur, click, rub or gallop Abdomen: soft, non-tender; bowel sounds normal; no masses,  no organomegaly and obese with mild pannus presnet Extremities: extremities normal, atraumatic, no cyanosis or edema Pulses: 2+ and symmetric Neurologic: Grossly normal  Labs:   Lab Results  Component Value Date   WBC 6.5 05/06/2016   HGB 10.0 (L) 05/06/2016   HCT 32.2 (L) 05/06/2016   MCV 99.4 05/06/2016   PLT 114 (L) 05/06/2016    Recent Labs Lab 05/04/16 1434  05/07/16 0648  NA 141  < > 139  K 3.9  < > 4.0  CL 102  < > 98*  CO2 26  < > 31  BUN 21*  < > 47*  CREATININE 1.26*  < > 1.70*  CALCIUM 9.7  < > 8.8*  PROT 6.5  --   --  BILITOT  1.1  --   --   ALKPHOS 70  --   --   ALT 35  --   --   AST 44*  --   --   GLUCOSE 168*  < > 87  < > = values in this interval not displayed.  Lipid Panel     Component Value Date/Time   CHOL 150 05/05/2016 0445   TRIG 61 05/05/2016 0445   HDL 53 05/05/2016 0445   CHOLHDL 2.8 05/05/2016 0445   VLDL 12 05/05/2016 0445   LDLCALC 85 05/05/2016 0445    BNP (last 3 results)  Recent Labs  09/12/15 0321 05/04/16 2334  BNP 578.2* 1,007.9*    HEMOGLOBIN A1C Lab Results  Component Value Date   HGBA1C 5.0 05/05/2016   MPG 97 05/05/2016    Cardiac Panel (last 3 results)  Recent Labs  05/04/16 2235 05/05/16 0445 05/05/16 1001  TROPONINI 0.10* 0.13* 0.12*    Lab Results  Component Value Date   CKTOTAL 106 08/14/2011   CKMB 3.0 08/14/2011   TROPONINI 0.12 (HH) 05/05/2016     TSH  Recent Labs  05/05/16 0445  TSH 0.892    EKG 05/06/2016: Normal sinus rhythm at rate of 82 bpm, left bundle branch block.  No further analysis due to LBBB.  No significant change from prior EKG.  Echocardiogram 09/13/2015: Normal LVEF, EF 29-79%, grade 3 diastolic dysfunction.  Moderately severe calcified annulus of the aortic valve, mild aortic regurgitation.  Severe mitral annular calcification.  Mild mitral regurgitation.  Right ventricle was moderately dilated.  Systolic function was mild to moderately reduced.  Right atrium was severely dilated.  Medium-sized PFO present.  Mild TR and PI, moderate only of hypertension, PA pressure 47 mmHg.   Radiology: No results found.  Scheduled Meds: . allopurinol  300 mg Oral Daily  . aspirin EC  81 mg Oral Daily  . citalopram  20 mg Oral QHS  . hydrALAZINE  25 mg Oral TID  . insulin aspart  0-15 Units Subcutaneous TID WC  . insulin aspart  0-5 Units Subcutaneous QHS  . insulin glargine  12 Units Subcutaneous QHS  . isosorbide mononitrate  120 mg Oral Daily  . levothyroxine  50 mcg Oral QAC breakfast  . Melatonin  3 mg Oral QHS  .  oseltamivir  30 mg Oral BID  . torsemide  40 mg Oral TID  . Warfarin - Pharmacist Dosing Inpatient   Does not apply q1800   Continuous Infusions: PRN Meds:.acetaminophen, albuterol, benzonatate, diazepam, menthol-cetylpyridinium, ondansetron **OR** ondansetron (ZOFRAN) IV  ASSESSMENT AND PLAN:  1. Acute on chronic diastolic heart failure, excellent UOP and patient is feeling much improved this morning 2. 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 Obesity and diastolic HF 3. DM-2 controlled with hyperglycemia 3.  Chronic cor pulmonale with acute exacerbation  Rec: Continue Demadex. Consider adding antibiotic with greenish yellow sputum and fever. Concern for influenza. Received one dose Zaroxolyn last night. She has had excellent diuresis overnight and dyspnea is improved.  Clinically she appears to be much more stable.  I again had a very long discussion with the patient regarding dietary changes that she is awake and weight loss.  It is unfortunate that she keeps presenting with similar presentation each time.  Long-term mortality remains high.  I will continue to follow the patient with you. I have recommended repeating an echocardiogram due to her new symptoms.  Adrian Prows, MD 05/07/2016, 9:38 AM [facility]. [place] Pager: [phone]  Office: [phone] If no answer Cell [phone]

## 2016-05-07 NOTE — Progress Notes (Signed)
Patient refused CPAP for the night  

## 2016-05-07 NOTE — Progress Notes (Signed)
ANTICOAGULATION CONSULT NOTE - Follow Up Consult  Pharmacy Consult for Warfarin Indication: atrial fibrillation  Assessment: 20 yoF admitted 1/28 for influenza with history of Afib on pta warfarin. Home dose is 7.5 mg daily, except 5mg  on Tuesdays, Thursdays, and Sundays. INR on admission was 1.9 (subtherapeutic). Last pta dose was 1/27, missed dose on 1/28.  Today, INR is 1.79. Hemoglobin and platelets are low, but stable. No overt bleeding noted.   Goal of Therapy:  INR 2-3 Monitor platelets by anticoagulation protocol: Yes   Plan:  - Warfarin 7.5 mg x 1 dose tonight - Monitor daily INR, CBC as needed, s/sx's of bleeding   Allergies  Allergen Reactions  . Morphine And Related Rash    Patient Measurements: Height: 5\' 3"  (160 cm) Weight: 267 lb 13.7 oz (121.5 kg) IBW/kg (Calculated) : 52.4  Vital Signs: Temp: 98.6 F (37 C) (01/31 0924) Temp Source: Oral (01/31 0924) BP: 149/54 (01/31 0924) Pulse Rate: 72 (01/31 0924)  Labs:  Recent Labs  05/04/16 1434 05/04/16 2235  05/05/16 0445 05/05/16 1001 05/06/16 0644 05/06/16 1123 05/07/16 0648  HGB 11.6*  --   --  10.7*  --  10.0*  --   --   HCT 37.6  --   --  34.6*  --  32.2*  --   --   PLT 127*  --   --  121*  --  114*  --   --   LABPROT  --   --   < > 21.6*  --   --  22.7* 21.0*  INR  --   --   < > 1.85  --   --  1.97 1.79  CREATININE 1.26*  --   --  1.48*  --  1.67*  --  1.70*  TROPONINI  --  0.10*  --  0.13* 0.12*  --   --   --   < > = values in this interval not displayed.  Estimated Creatinine Clearance: 44.4 mL/min (by C-G formula based on SCr of 1.7 mg/dL (H)).   Medications:  Scheduled:  . allopurinol  300 mg Oral Daily  . aspirin EC  81 mg Oral Daily  . citalopram  20 mg Oral QHS  . hydrALAZINE  25 mg Oral TID  . insulin aspart  0-15 Units Subcutaneous TID WC  . insulin aspart  0-5 Units Subcutaneous QHS  . insulin glargine  12 Units Subcutaneous QHS  . isosorbide mononitrate  120 mg Oral Daily   . levothyroxine  50 mcg Oral QAC breakfast  . Melatonin  3 mg Oral QHS  . oseltamivir  30 mg Oral BID  . torsemide  40 mg Oral TID  . Warfarin - Pharmacist Dosing Inpatient   Does not apply q1800   Jerianne Anselmo D. Choice Kleinsasser, PharmD, BCPS Clinical Pharmacist Pager: [phone] 05/07/2016 10:07 AM

## 2016-05-08 ENCOUNTER — Inpatient Hospital Stay (HOSPITAL_COMMUNITY): Payer: Medicare HMO

## 2016-05-08 DIAGNOSIS — I509 Heart failure, unspecified: Secondary | ICD-10-CM

## 2016-05-08 LAB — ECHOCARDIOGRAM COMPLETE
HEIGHTINCHES: 63 in
WEIGHTICAEL: 4285.74 [oz_av]

## 2016-05-08 LAB — GLUCOSE, CAPILLARY
GLUCOSE-CAPILLARY: 108 mg/dL — AB (ref 65–99)
GLUCOSE-CAPILLARY: 123 mg/dL — AB (ref 65–99)
GLUCOSE-CAPILLARY: 109 mg/dL — AB (ref 65–99)

## 2016-05-08 LAB — PROTIME-INR
INR: 1.7
Prothrombin Time: 20.2 seconds — ABNORMAL HIGH (ref 11.4–15.2)

## 2016-05-08 LAB — BASIC METABOLIC PANEL
Anion gap: 11 (ref 5–15)
BUN: 51 mg/dL — AB (ref 6–20)
CO2: 34 mmol/L — ABNORMAL HIGH (ref 22–32)
Calcium: 9 mg/dL (ref 8.9–10.3)
Chloride: 96 mmol/L — ABNORMAL LOW (ref 101–111)
Creatinine, Ser: 1.68 mg/dL — ABNORMAL HIGH (ref 0.44–1.00)
GFR calc Af Amer: 37 mL/min — ABNORMAL LOW (ref >60)
GFR calc non Af Amer: 32 mL/min — ABNORMAL LOW (ref >60)
Glucose, Bld: 107 mg/dL — ABNORMAL HIGH (ref 65–99)
POTASSIUM: 3.1 mmol/L — AB (ref 3.5–5.1)
Sodium: 141 mmol/L (ref 135–145)

## 2016-05-08 MED ORDER — ENOXAPARIN SODIUM 120 MG/0.8ML ~~LOC~~ SOLN: 120.0000 mg | Freq: Two times a day (BID) | SUBCUTANEOUS | Status: DC

## 2016-05-08 MED ORDER — TORSEMIDE 20 MG PO TABS: 40.0000 mg | ORAL_TABLET | Freq: Every day | ORAL | Status: DC

## 2016-05-08 MED ORDER — ENOXAPARIN SODIUM 120 MG/0.8ML ~~LOC~~ SOLN
120.0000 mg | Freq: Once | SUBCUTANEOUS | Status: AC
Start: 2016-05-08 — End: 2016-05-08
  Administered 2016-05-08: 120 mg via SUBCUTANEOUS
  Filled 2016-05-08: qty 0.8

## 2016-05-08 MED ORDER — POTASSIUM CHLORIDE CRYS ER 20 MEQ PO TBCR: 20.0000 meq | EXTENDED_RELEASE_TABLET | Freq: Two times a day (BID) | ORAL | Status: DC

## 2016-05-08 MED ORDER — POTASSIUM CHLORIDE CRYS ER 20 MEQ PO TBCR
40.0000 meq | EXTENDED_RELEASE_TABLET | Freq: Once | ORAL | Status: AC
  Administered 2016-05-08: 40 meq via ORAL
  Filled 2016-05-08: qty 2

## 2016-05-08 MED ORDER — WARFARIN SODIUM 10 MG PO TABS
10.0000 mg | ORAL_TABLET | Freq: Once | ORAL | Status: AC
  Administered 2016-05-08: 10 mg via ORAL
  Filled 2016-05-08: qty 1

## 2016-05-08 MED ORDER — OSELTAMIVIR PHOSPHATE 30 MG PO CAPS: 30.0000 mg | ORAL_CAPSULE | Freq: Two times a day (BID) | ORAL | 0 refills | Status: DC

## 2016-05-08 MED ORDER — BENZONATATE 100 MG PO CAPS: 100.0000 mg | ORAL_CAPSULE | Freq: Two times a day (BID) | ORAL | 0 refills | Status: DC | PRN

## 2016-05-08 MED ORDER — TORSEMIDE 20 MG PO TABS: 40.0000 mg | ORAL_TABLET | Freq: Every day | ORAL | 0 refills | Status: DC

## 2016-05-08 MED ORDER — ENOXAPARIN SODIUM 120 MG/0.8ML ~~LOC~~ SOLN: 120.0000 mg | Freq: Two times a day (BID) | SUBCUTANEOUS | 0 refills | Status: DC

## 2016-05-08 MED ORDER — ROSUVASTATIN CALCIUM 10 MG PO TABS
10.0000 mg | ORAL_TABLET | Freq: Every day | ORAL | Status: DC
  Filled 2016-05-08: qty 1

## 2016-05-08 MED ORDER — ROSUVASTATIN CALCIUM 10 MG PO TABS: 10.0000 mg | ORAL_TABLET | Freq: Every day | ORAL | 0 refills | Status: DC

## 2016-05-08 NOTE — Progress Notes (Signed)
  Echocardiogram 2D Echocardiogram has been performed.  Jennette Dubin 05/08/2016, 2:55 PM

## 2016-05-08 NOTE — Progress Notes (Addendum)
ANTICOAGULATION CONSULT NOTE - Follow Up Consult  Pharmacy Consult for Warfarin + Lovenox Indication: atrial fibrillation, hx PE x3  Assessment: 24 yoF admitted 1/28 for influenza with history of Afib and PE x3 on pta warfarin. Home dose is 7.5 mg daily, except 5mg  on Tuesdays, Thursdays, and Sundays. INR on admission was 1.9 (subtherapeutic). Last pta dose was 1/27, missed dose on 1/28.  Today, INR is 1.7 despite getting 7.5mg  x3 days after missed dose. Hemoglobin and platelets are low, but stable. No overt bleeding noted.   To add Lovenox d/t low INR. SCr 1.68 with CrCL ~45mL/min.  Goal of Therapy:  INR 2-3 Monitor platelets by anticoagulation protocol: Yes   Plan:  - Warfarin 10mg  x 1 dose tonight - Lovenox 1mg /kg (120mg ) subQ q12h - Stop Lovenox when INR therapeutic- no minimum day of overlap required as patient does not have active clot - Monitor daily INR, CBC - Monitor for s/s bleeding   Allergies  Allergen Reactions  . Morphine And Related Rash    Patient Measurements: Height: 5\' 3"  (160 cm) Weight: 267 lb 13.7 oz (121.5 kg) IBW/kg (Calculated) : 52.4  Vital Signs: Temp: 98.3 F (36.8 C) (02/01 0551) Temp Source: Oral (02/01 0551) BP: 139/58 (02/01 0551) Pulse Rate: 60 (02/01 0551)  Labs:  Recent Labs  05/05/16 1001 05/06/16 0644 05/06/16 1123 05/07/16 0648 05/08/16 0546  HGB  --  10.0*  --   --   --   HCT  --  32.2*  --   --   --   PLT  --  114*  --   --   --   LABPROT  --   --  22.7* 21.0* 20.2*  INR  --   --  1.97 1.79 1.70  CREATININE  --  1.67*  --  1.70* 1.68*  TROPONINI 0.12*  --   --   --   --     Estimated Creatinine Clearance: 45 mL/min (by C-G formula based on SCr of 1.68 mg/dL (H)).  Brandonlee Navis D. Letica Giaimo, PharmD, BCPS Clinical Pharmacist Pager: [phone] 05/08/2016 8:11 AM

## 2016-05-08 NOTE — Evaluation (Signed)
Physical Therapy Evaluation Patient Details Name: [name] MRN: [id number] DOB: 1956/01/15 Today's Date: 05/08/2016   History of Present Illness  61 Y/O F with PMX of DM, PE &DVT, OSA, hypothyroidism, HTN, HLD, Depression, CHF, Anemia, Afib, CKD III, presented with three days hx of a cough, SOB and chest soreness associated with her cough.  Positive for flu.  Clinical Impression  Pt is close to baseline functioning and should be safe at home with limited assist. There are no further acute PT needs, but she is weak and deconditioned and could benefit from HHPT.  Will sign off at this time.     Follow Up Recommendations Home health PT    Equipment Recommendations       Recommendations for Other Services       Precautions / Restrictions Precautions Precautions: Fall      Mobility  Bed Mobility Overal bed mobility: Modified Independent                Transfers Overall transfer level: Modified independent               General transfer comment: slow with use of rail and cane  Ambulation/Gait Ambulation/Gait assistance: Supervision Ambulation Distance (Feet): 170 Feet (total with 2 standing rest breaks to check vitals. see qual.) Assistive device: Straight cane Gait Pattern/deviations: Step-through pattern   Gait velocity interpretation: Below normal speed for age/gender General Gait Details: Steady while not fatigued, mildly unsteady with cane when fatigued.  Stairs            Wheelchair Mobility    Modified Rankin (Stroke Patients Only)       Balance Overall balance assessment: Needs assistance Sitting-balance support: No upper extremity supported Sitting balance-[name]: Good       Standing balance-[name]: Fair Standing balance comment: prefers use of the cane                             Pertinent Vitals/Pain Pain Assessment: No/denies pain    Home Living Family/patient expects to be discharged to:: Private  residence Living Arrangements: Alone Available Help at Discharge: Family;Available PRN/intermittently Type of Home: House Home Access: Stairs to enter Entrance Stairs-Rails: Chemical engineer of Steps: 4 Home Layout: One level Home Equipment: Cane - single point;Tub bench;Walker - 2 wheels      Prior Function Level of Independence: Independent with assistive device(s)         Comments: Uses cane when outside home.  Denies falls over the past 6 months.       Hand Dominance   Dominant Hand: Right    Extremity/Trunk Assessment   Upper Extremity Assessment Upper Extremity Assessment: Overall WFL for tasks assessed    Lower Extremity Assessment Lower Extremity Assessment: Overall WFL for tasks assessed (some proximal weakness, but functional)       Communication   Communication: No difficulties  Cognition Arousal/Alertness: Awake/alert Behavior During Therapy: WFL for tasks assessed/performed Overall Cognitive Status: Within Functional Limits for tasks assessed                      General Comments General comments (skin integrity, edema, etc.): see qual for specifics, but sats on RA at rest or mobilizing at least 94% or greater with EHR up into the 110's during gait.    Exercises     Assessment/Plan    PT Assessment All further PT needs can be met in the  next venue of care  PT Problem List Decreased strength;Decreased activity tolerance;Decreased balance;Decreased mobility          PT Treatment Interventions      PT Goals (Current goals can be found in the Care Plan section)  Acute Rehab PT Goals Patient Stated Goal: home PT Goal Formulation: With patient    Frequency     Barriers to discharge        Co-evaluation               End of Session   Activity Tolerance: Patient limited by fatigue;Patient tolerated treatment well Patient left: in bed;with call bell/phone within reach Nurse Communication: Mobility status          Time: 1537-1600 PT Time Calculation (min) (ACUTE ONLY): 23 min   Charges:   PT Evaluation $PT Eval Low Complexity: 1 Procedure PT Treatments $Gait Training: 8-22 mins   PT G CodesTessie Fass [name] 05/08/2016, 4:14 PM 05/08/2016  Donnella Sham, [name] [phone]  (pager)

## 2016-05-08 NOTE — Progress Notes (Signed)
[name] to be D/C'd Home per MD order.  Discussed prescriptions and follow up appointments with the patient. Prescriptions given to patient, medication list explained in detail. Pt verbalized understanding.  Allergies as of 05/08/2016      Reactions   Morphine And Related Rash      Medication List    STOP taking these medications   ferrous gluconate 324 MG tablet Commonly known as:  FERGON   furosemide 80 MG tablet Commonly known as:  LASIX   isosorbide-hydrALAZINE 20-37.5 MG tablet Commonly known as:  BIDIL     TAKE these medications   acetaminophen 500 MG tablet Commonly known as:  TYLENOL Take 500 mg by mouth every 6 (six) hours as needed for fever or headache (pain).   allopurinol 300 MG tablet Commonly known as:  ZYLOPRIM Take 300 mg by mouth daily.   aspirin EC 81 MG tablet Take 81 mg by mouth daily.   benzonatate 100 MG capsule Commonly known as:  TESSALON Take 1 capsule (100 mg total) by mouth 2 (two) times daily as needed for cough.   Biotin 1000 MCG tablet Take 1,000 mcg by mouth See admin instructions. Take 1 tablet (1000 mcg) by mouth every afternoon and evening   CALCIUM CITRATE PO Take 600 mg by mouth See admin instructions. Take 1 tablet (600 mg) by mouth every afternoon and evening   citalopram 20 MG tablet Commonly known as:  CELEXA Take 20 mg by mouth at bedtime.   diazepam 5 MG tablet Commonly known as:  VALIUM Take 5 mg by mouth at bedtime as needed (back spasms).   enoxaparin 120 MG/0.8ML injection Commonly known as:  LOVENOX Inject 0.8 mLs (120 mg total) into the skin every 12 (twelve) hours.   Fish Oil 1000 MG Caps Take 1,000 mg by mouth See admin instructions. Take 1 capsule (1000 mg) by mouth every afternoon and evening   hydrALAZINE 25 MG tablet Commonly known as:  APRESOLINE Take 25 mg by mouth 3 (three) times daily.   HYDROcodone-acetaminophen 5-325 MG tablet Commonly known as:  NORCO/VICODIN Take 1-2 tablets by mouth  every 6 (six) hours as needed for severe pain. What changed:  how much to take  when to take this   insulin aspart 100 UNIT/ML injection Commonly known as:  novoLOG Inject 2 Units into the skin 3 (three) times daily with meals. What changed:  how much to take   isosorbide mononitrate 120 MG 24 hr tablet Commonly known as:  IMDUR Take 120 mg by mouth daily.   LEVEMIR FLEXTOUCH 100 UNIT/ML Pen Generic drug:  Insulin Detemir Inject 24 Units into the skin daily before breakfast.   levothyroxine 50 MCG tablet Commonly known as:  SYNTHROID, LEVOTHROID Take 50 mcg by mouth daily before breakfast.   Melatonin 5 MG Tabs Take 5 mg by mouth at bedtime.   multivitamin with minerals Tabs tablet Take 1 tablet by mouth daily.   oseltamivir 75 MG capsule Commonly known as:  TAMIFLU Take 1 capsule (75 mg total) by mouth every 12 (twelve) hours.   oseltamivir 30 MG capsule Commonly known as:  TAMIFLU Take 1 capsule (30 mg total) by mouth 2 (two) times daily.   rosuvastatin 10 MG tablet Commonly known as:  CRESTOR Take 1 tablet (10 mg total) by mouth daily. Start taking on:  05/09/2016   SLOW RELEASE IRON 45 MG Tbcr Generic drug:  Ferrous Sulfate Dried Take 45 mg by mouth daily.   torsemide 20 MG tablet  Commonly known as:  DEMADEX Take 2 tablets (40 mg total) by mouth daily. Start taking on:  05/09/2016 What changed:  medication strength  how much to take  when to take this   warfarin 2.5 MG tablet Commonly known as:  COUMADIN TAKE 7.5 MG ON M-W-F AND 5 MG TUES, THURS, SAT AND SUN AS DIRECTED What changed:  See the new instructions.       Vitals:   05/08/16 0551 05/08/16 0857  BP: (!) 139/58 137/65  Pulse: 60 67  Resp: 18 18  Temp: 98.3 F (36.8 C) 98.3 F (36.8 C)    Skin clean, dry and intact without evidence of skin break down, no evidence of skin tears noted. IV catheter discontinued intact. Site without signs and symptoms of complications. Dressing and  pressure applied. Pt denies pain at this time. No complaints noted.  An After Visit Summary was printed and given to the patient. Patient escorted via [place], and D/C home via private auto.  Retta Mac BSN, RN

## 2016-05-08 NOTE — Care Management Note (Signed)
Case Management Note  Patient Details  Name: [name] MRN: [id number] Date of Birth: 02-Nov-1955  Subjective/Objective:                 Patient from home alone, has walker for outside use, and cane. Would like to use [facility]. Referral made to [facility] clinical liaison. No there clinical needs identified. (Left sticky note for MD to place [facility] order)   Action/Plan:  Anticipate DC to home with [facility].  Expected Discharge Date:  05/06/16               Expected Discharge Plan:  [name]  In-House Referral:     Discharge planning Services  CM Consult  Post Acute Care Choice:  Home Health Choice offered to:  Patient  DME Arranged:    DME Agency:     HH Arranged:  PT HH Agency:  [name]  Status of Service:  Completed, signed off  If discussed at [place] of Stay Meetings, dates discussed:    Additional Comments:  Carles Collet, RN 05/08/2016, 4:33 PM

## 2016-05-08 NOTE — Progress Notes (Signed)
Family Medicine Teaching Service Daily Progress Note Intern Pager: [phone]  Patient name: [name] Medical record number: [id number] Date of birth: [birth year] Age: 61 y.o. Gender: female  Primary Care Provider: [name], MD Consultants: cardiology Code Status: FULL  Pt Overview and Major Events to Date:  1/28 Placed in observation for flu  Assessment and Plan: [name] is a 61 y.o. female presenting with shortness of breath. 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   Acute on chronic HFpEF with pulm HTN, improving Possibly in mild exacerbation since BNP 1007.9 and is likely contributing to dyspnea. Echo 09/23/15 with EF 55-60%, G3DD. Cath 03/2015 shows pulmonary HTN. Per chart review dry weight appears ~262lbs, on admit 270lbs. At home on lasix 80mg  qd though most recent hospitalization in June 2017 reports transitioned to torsemide but patient reports being placed back on lasix recently due to fluid retention. - cardiology following, appreciate recommendations. OK to d/c from their standpoint, will follow as outpatient - continue torsemide 40 mg daily.  - echocardiogram pending, planned for today - Continue imdur 120mg  24hr tablet qd, hydralazine 25mg  TID - strict I/Os: net output 5.7 L - daily weights  Acute Hypoxic Respiratory Failure 2/2 influenza, improving Multifactorial from viral, CHF, COPD, OSA. Found to be influenza positive with 3 days of fever, cough and congestion at home.Has COPD on problem list though she denies every being diagnosed with this. Remote smoking history. No COPD medications at home. Afebrile since admission. - tamiflu for 5 days, on day 4/5 - monitor on telemetry  - albuterol neb q1prn - continue to monitor off antibiotics - CPAP qhs - diuresis as per above - PT/OT eval pending  CKD On admit 1.26, increasing this  hospital stay >1.48>1.67>1.7>1.68. Difficult baseline given has had creatinine as high as 2.4 with GFR in 20s in the past. Worsening creatinine function likely her wandering baseline in the setting of diabetic nephropathy  - continue to monitor  Atypical Chest Pain in setting of cough. Unlikely to be ACS but troponin 0.10>0.13>0.12 likely due to demand. Has multiple risk factors including HTN, HLD, DM and HEART score of 5 but EKG showed afib and old LBBB, no new changes. - monitor on telemetry  Atrial fibrillation on warfarin - monitor PT/INR  - continue warfarin per pharmacy  IDDM. Home regimen of levemir 24U daily, novolog 6-10U with meals. a1c 5.0. Fasting sugar this morning 108. - Lantus 12U qd - mSSI - monitor CBGs  History of PEx3 on chronic anticoagulation. - Wafarin per pharmacy  Hypothyroidism- stable -continue home levothyroxine 50 mcg qd  HTN- stable: BP 137/65 this morning. At home on imdur 120mg  24hr tablet qd, hydralazine 25mg  TID - continue home medications  HLD not on statin at home. lipid panel: LDL 85, ASCVD 41yr risk 5.8% - starting moderate intensity statin, crestor 10 mg  H/o OSA - CPAP qhs  H/o gout - continue home allopurinol 300mg   Disposition: pending PT eval  Subjective:  Feels well today. Started coughing last night, denies fevers or chills. No shortness of breath or chest pain.   Objective: Temp:  [97.9 F (36.6 C)-98.3 F (36.8 C)] 98.3 F (36.8 C) (02/01 0857) Pulse Rate:  [60-87] 67 (02/01 0857) Resp:  [16-18] 18 (02/01 0857) BP: (108-165)/(58-65) 137/65 (02/01 0857) SpO2:  [93 %-94 %] 94 % (02/01 0857) Physical Exam: General: Sitting up in bed comfortably.  In no distress Cardiovascular: regular rate, S1 and S2 with 2/6 systolic murmur. No rubs/gallops. Respiratory: clear to auscultation bilaterally. No increased work of breathing Abdomen: Obese, soft, nontender, nondistended + bowel sounds Extremities: LE chronic venous  changes, no edema noted  Laboratory:  Recent Labs Lab 05/04/16 1434 05/05/16 0445 05/06/16 0644  WBC 5.2 4.4 6.5  HGB 11.6* 10.7* 10.0*  HCT 37.6 34.6* 32.2*  PLT 127* 121* 114*    Recent Labs Lab 05/04/16 1434  05/06/16 0644 05/07/16 0648 05/08/16 0546  NA 141  < > 138 139 141  K 3.9  < > 4.0 4.0 3.1*  CL 102  < > 104 98* 96*  CO2 26  < > 28 31 34*  BUN 21*  < > 39* 47* 51*  CREATININE 1.26*  < > 1.67* 1.70* 1.68*  CALCIUM 9.7  < > 8.6* 8.8* 9.0  PROT 6.5  --   --   --   --   BILITOT 1.1  --   --   --   --   ALKPHOS 70  --   --   --   --   ALT 35  --   --   --   --   AST 44*  --   --   --   --   GLUCOSE 168*  < > 92 87 107*  < > = values in this interval not displayed. BNP 1007.9  Imaging/Diagnostic Tests: No results found.  Steve Rattler, DO 05/08/2016, 9:37 AM PGY-1, [place] Intern pager: [phone], text pages welcome

## 2016-05-08 NOTE — Progress Notes (Signed)
Subjective:  Coughing yellow sputum. No fever, dyspnea improved. No orthopnea.   Objective:  Vital Signs in the last 24 hours: Temp:  [97.9 F (36.6 C)-98.3 F (36.8 C)] 98.3 F (36.8 C) (02/01 0857) Pulse Rate:  [60-87] 67 (02/01 0857) Resp:  [16-18] 18 (02/01 0857) BP: (108-165)/(58-65) 137/65 (02/01 0857) SpO2:  [93 %-94 %] 94 % (02/01 0857)  Intake/Output from previous day: 01/31 0701 - 02/01 0700 In: 10 [P.O.:780] Out: 5500 [Urine:5500]  Physical Exam: General appearance: alert, cooperative, appears older than stated age, no distress and moderately obese Lungs: clear to auscultation bilaterally and with occasional expiratory wheeze Chest wall: no tenderness Heart: regular rate and rhythm, S1, S2 normal, no murmur, click, rub or gallop Abdomen: soft, non-tender; bowel sounds normal; no masses,  no organomegaly and obese with mild pannus presnet Extremities: extremities normal, atraumatic, no cyanosis or edema, chronic dermatitis from venous stasis noted.  Pulses: 2+ and symmetric Neurologic: Grossly normal Lab Results: BMP  Recent Labs  05/06/16 0644 05/07/16 0648 05/08/16 0546  NA 138 139 141  K 4.0 4.0 3.1*  CL 104 98* 96*  CO2 28 31 34*  GLUCOSE 92 87 107*  BUN 39* 47* 51*  CREATININE 1.67* 1.70* 1.68*  CALCIUM 8.6* 8.8* 9.0  GFRNONAA 32* 32* 32*  GFRAA 37* 37* 37*    CBC  Recent Labs Lab 05/04/16 1434  05/06/16 0644  WBC 5.2  < > 6.5  RBC 3.80*  < > 3.24*  HGB 11.6*  < > 10.0*  HCT 37.6  < > 32.2*  PLT 127*  < > 114*  MCV 98.9  < > 99.4  MCH 30.5  < > 30.9  MCHC 30.9  < > 31.1  RDW 15.3  < > 15.4  LYMPHSABS 0.4*  --   --   MONOABS 0.2  --   --   EOSABS 0.0  --   --   BASOSABS 0.0  --   --   < > = values in this interval not displayed.  HEMOGLOBIN A1C Lab Results  Component Value Date   HGBA1C 5.0 05/05/2016   MPG 97 05/05/2016    Cardiac Panel (last 3 results)  Recent Labs  05/04/16 2235 05/05/16 0445 05/05/16 1001  TROPONINI  0.10* 0.13* 0.12*    BNP (last 3 results) No results for input(s): PROBNP in the last 8760 hours.  TSH  Recent Labs  05/05/16 0445  TSH 0.892    CHOLESTEROL  Recent Labs  05/05/16 0445  CHOL 150    Hepatic Function Panel  Recent Labs  09/13/15 0225 05/04/16 1434  PROT 5.8* 6.5  ALBUMIN 3.1* 3.9  AST 26 44*  ALT 27 35  ALKPHOS 62 70  BILITOT 1.2 1.1    Imaging: Imaging results have been reviewed  EKG 05/06/2016: Normal sinus rhythm at rate of 82 bpm, left bundle branch block.  No further analysis due to LBBB.  No significant change from prior EKG.  Echocardiogram 09/13/2015: Normal LVEF, EF 01-75%, grade 3 diastolic dysfunction.  Moderately severe calcified annulus of the aortic valve, mild aortic regurgitation.  Severe mitral annular calcification.  Mild mitral regurgitation.  Right ventricle was moderately dilated.  Systolic function was mild to moderately reduced.  Right atrium was severely dilated.  Medium-sized PFO present.  Mild TR and PI, moderate only of hypertension, PA pressure 47 mmHg.    Assessment/Plan:  1. Acute on chronic diastolic heart failure, excellent UOP and patient is feeling much improved this morning  2. 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 Obesity and diastolic HF 3. DM-2 controlled with hyperglycemia 3.  Chronic cor pulmonale with acute exacerbation 4. DM with stage 3 CKD.   Rec: She is now well compensated from CHF standpoint and any further diuresis will lead to acute renal failure. Continue Demadex 40 mg daily. I do not need echo if not done. Stable CV status. Patient is negative 4.5 L since admission. I will follow as OP.   Adrian Prows, M.D. 05/08/2016, 9:27 AM [facility] Pager: [phone] Office: [phone] If no answer: [phone]

## 2016-05-08 NOTE — Care Management Important Message (Signed)
Important Message  Patient Details  Name: [name] MRN: [id number] Date of Birth: July 28, 1955   Medicare Important Message Given:  Yes    Carles Collet, RN 05/08/2016, 12:11 PM

## 2016-05-08 NOTE — Discharge Summary (Signed)
[facility] Discharge Summary  Patient name: [name] Medical record number: [id number] Date of birth: November 14, 1955 Age: 61 y.o. 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  Acute hypoxia 2/2 Influenza Patient presented with c/o shortness of breath in the setting of 3 days of fever, cough and congestion at home. T max in ED 100.40F without leukocytosis, lactic acid 1.28 WNL, qSOFA for RR>22. Had hypoxia to 81% in ED requiring oxygen supplementation. Found to be influenza positive and started on tamiflu. Improved clinically throughout her hospital stay and was weaned back to room air prior to discharge.   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 At home is on lasix 80mg  qd though most recent hospitalization in June 2017 reports patient being transitioned to torsemide but patient reports being placed back on increased lasix dose recently 1 month ago due to  fluid retention. Cardiology was consulted and advised torsemide 40mg  TID for diuresis. Patient clinically improved and was discharged home on 2/1.  Issues for Follow Up:  1. Discharged with 5 days of Lovenox bridge due to subtherapeutic INR, needs close follow up with person managing her warfarin to ensure this is in therapeutic range. Received 10 mg day of discharge and to resume normal dosing schedule thereafter.  2. Follow up with cardiology for heart failure 3. Continue to recommend diet and lifestyle changes to facilitate weight loss 4. Clarify COPD diagnosis 5. UA on admit with small Hgb, 6-30 RBC. Will need outpatient referral to urology for microhematuria.  Significant Procedures: none  Significant Labs and Imaging:   Recent Labs Lab 05/04/16 1434 05/05/16 0445 05/06/16 0644  WBC 5.2 4.4 6.5  HGB 11.6* 10.7* 10.0*  HCT 37.6 34.6* 32.2*  PLT 127* 121* 114*    Recent Labs Lab 05/04/16 1434 05/05/16 0445 05/06/16 0644 05/07/16 0648 05/08/16 0546  NA 141 137 138 139 141  K 3.9 4.0 4.0 4.0 3.1*  CL 102 101 104 98* 96*  CO2 26 25 28 31  34*  GLUCOSE 168* 217* 92 87 107*  BUN 21* 26* 39* 47* 51*  CREATININE 1.26* 1.48* 1.67* 1.70* 1.68*  CALCIUM 9.7 9.1 8.6* 8.8* 9.0  ALKPHOS 70  --   --   --   --   AST 44*  --   --   --   --   ALT 35  --   --   --   --   ALBUMIN 3.9  --   --   --   --    DG Chest 2 View 05/04/16  CLINICAL DATA:  Patient with productive cough, fever and headache.  EXAM: CHEST  2 VIEW  COMPARISON:  Chest radiograph 09/13/2015.  FINDINGS: Stable cardiomegaly. Calcification of the thoracic aorta. No consolidative pulmonary opacities. Chronic interstitial opacities. No pleural effusion or pneumothorax. Mid thoracic spine degenerative changes.  IMPRESSION: No active cardiopulmonary disease.  Chronic interstitial opacities.   Electronically Signed   By: Lovey Newcomer M.D.   On: 05/04/2016 15:10  Results/Tests Pending at Time of  Discharge: none  Discharge Medications:  Allergies as of 05/08/2016      Reactions   Morphine And Related Rash      Medication List    STOP taking these medications   ferrous gluconate 324 MG tablet Commonly known as:  FERGON   furosemide 80 MG tablet Commonly known as:  LASIX   isosorbide-hydrALAZINE 20-37.5 MG tablet Commonly known as:  BIDIL     TAKE these medications   acetaminophen 500 MG tablet Commonly known as:  TYLENOL Take 500 mg by mouth every 6 (six) hours as needed for fever or headache (pain).   allopurinol 300 MG tablet Commonly known as:  ZYLOPRIM Take 300 mg by mouth daily.   aspirin EC 81 MG tablet Take 81 mg by mouth daily.   benzonatate 100 MG capsule Commonly known as:  TESSALON Take 1 capsule (100 mg total) by mouth 2 (two) times daily as needed for cough.   Biotin 1000 MCG tablet Take 1,000 mcg by mouth See admin instructions. Take 1 tablet (1000 mcg) by mouth every afternoon and evening   CALCIUM CITRATE PO Take 600 mg by mouth See admin instructions. Take 1 tablet (600 mg) by mouth every afternoon and evening   citalopram 20 MG tablet Commonly known as:  CELEXA Take 20 mg by mouth at bedtime.   diazepam 5 MG tablet Commonly known as:  VALIUM Take 5 mg by mouth at bedtime as needed (back spasms).   enoxaparin 120 MG/0.8ML injection Commonly known as:  LOVENOX Inject 0.8 mLs (120 mg total) into the skin every 12 (twelve) hours.   Fish Oil 1000 MG Caps Take 1,000 mg by mouth See admin instructions. Take 1 capsule (1000 mg) by mouth every afternoon and evening   hydrALAZINE 25 MG tablet Commonly known as:  APRESOLINE Take 25 mg by mouth 3 (three) times daily.   HYDROcodone-acetaminophen 5-325 MG tablet Commonly known as:  NORCO/VICODIN Take 1-2 tablets by mouth every 6 (six) hours as needed for severe pain. What changed:  how much to take  when to take this   insulin aspart 100 UNIT/ML injection Commonly known as:   novoLOG Inject 2 Units into the skin 3 (three) times daily with meals. What changed:  how much to take   isosorbide mononitrate 120 MG 24 hr tablet Commonly known as:  IMDUR Take 120 mg by mouth daily.   LEVEMIR FLEXTOUCH 100 UNIT/ML Pen Generic drug:  Insulin Detemir Inject 24 Units into the skin daily before breakfast.   levothyroxine 50 MCG tablet Commonly known as:  SYNTHROID, LEVOTHROID Take 50 mcg by mouth daily before breakfast.   Melatonin 5 MG Tabs Take 5 mg by mouth at bedtime.   multivitamin with minerals Tabs tablet Take 1 tablet by mouth daily.   oseltamivir 75 MG capsule Commonly known as:  TAMIFLU Take 1 capsule (75 mg total) by mouth every 12 (twelve) hours.   oseltamivir 30 MG capsule Commonly known as:  TAMIFLU Take 1 capsule (30 mg total) by mouth 2 (two) times daily.   rosuvastatin 10 MG tablet Commonly known  as:  CRESTOR Take 1 tablet (10 mg total) by mouth daily.   SLOW RELEASE IRON 45 MG Tbcr Generic drug:  Ferrous Sulfate Dried Take 45 mg by mouth daily.   torsemide 20 MG tablet Commonly known as:  DEMADEX Take 2 tablets (40 mg total) by mouth daily. What changed:  medication strength  how much to take  when to take this   warfarin 2.5 MG tablet Commonly known as:  COUMADIN TAKE 7.5 MG ON M-W-F AND 5 MG TUES, THURS, SAT AND SUN AS DIRECTED What changed:  See the new instructions.       Discharge Instructions: Please refer to Patient Instructions section of EMR for full details.  Patient was counseled important signs and symptoms that should prompt return to medical care, changes in medications, dietary instructions, activity restrictions, and follow up appointments.   Follow-Up Appointments: Follow-up Information    MOREIRA,ROY, MD Follow up.   Specialty:  Internal Medicine Why:  Appoinment Date: 05/12/2016 at 2:00p  Contact information: [id number] [place] [address] [phone]        Adrian Prows, MD. Schedule an  appointment as soon as possible for a visit.   Specialty:  Cardiology Contact information: [place] [address] [phone]           Steve Rattler, DO 05/09/2016, 8:00 AM PGY-1, [place]

## 2016-05-08 NOTE — Progress Notes (Signed)
SATURATION QUALIFICATIONS: (This note is used to comply with regulatory documentation for home oxygen)  Patient Saturations on Room Air at Rest = 96%  Patient Saturations on Room Air while Ambulating = 96-98%  Patient Saturations on N/A Liters of oxygen while Ambulating = N/A`%  Please briefly explain why patient needs home oxygen: Not necessary.  05/08/2016  Donnella Sham, [place] [phone]  (pager)

## 2016-05-08 NOTE — Progress Notes (Signed)
Transitions of Care Pharmacy Note  Plan:  Educated on lovenox dosing and administration technique, warfarin monitoring Recommend follow up with PCP for anticoagulation management as soon as possible.  Follow-up therapeutic INR + lovenox dc --------------------------------------------- [name] is an 61 y.o. female who presents with a chief complaint dyspnea, cough, flu+. In anticipation of discharge, pharmacy has reviewed this patient's prior to admission medication history, as well as current inpatient medications listed per the [facility].  Current medication indications, dosing, frequency, and notable side effects reviewed with patient. patient verbalized understanding of current inpatient medication regimen and is aware that the After Visit Summary when presented, will represent the most accurate medication list at discharge.   [name] expressed no concerns. Educated on indication for lovenox therapy, importance of immediate outpatient follow up with PCP managing anticoagulation as lovenox is intended to cover patient until INR is therapeutic x1 (not a bridge)   Assessment: Understanding of regimen: excellent Understanding of indications: excellent Potential of compliance: excellent Barriers to Obtaining Medications: No  Patient instructed to contact inpatient pharmacy team with further questions or concerns if needed.    Time spent preparing for discharge counseling: 10 mins Time spent counseling patient: 10 mins   Thank you for allowing pharmacy to be a part of this patient's care.  [name], [name]

## 2016-05-21 ENCOUNTER — Other Ambulatory Visit: Payer: Self-pay | Admitting: Hematology and Oncology

## 2016-05-21 ENCOUNTER — Telehealth: Payer: Self-pay | Admitting: *Deleted

## 2016-05-21 NOTE — Telephone Encounter (Signed)
Per Meade Maw, after review with Dr. Irene Limbo, pt does not need 2/15 lab appointment for PT/INR.  Pt was pt of Dr. Marko Plume and has not been seen in over 1 year.  Pt should follow up on PT/INR with PCP or Cardiologist.  Per instruction, lab apt cancelled and LVM with patient stating apt would be cancelled and to follow up with PCP/cardiologist unless new questions/concerns arise.  Call back number provided.

## 2016-05-22 ENCOUNTER — Other Ambulatory Visit: Payer: Medicare HMO

## 2016-10-20 ENCOUNTER — Other Ambulatory Visit: Payer: Self-pay | Admitting: Internal Medicine

## 2016-10-20 DIAGNOSIS — Z1231 Encounter for screening mammogram for malignant neoplasm of breast: Secondary | ICD-10-CM

## 2016-10-28 ENCOUNTER — Ambulatory Visit: Payer: Medicare HMO

## 2016-11-12 ENCOUNTER — Ambulatory Visit
Admission: RE | Admit: 2016-11-12 | Discharge: 2016-11-12 | Disposition: A | Discharge status: Home / Self Care | Payer: Medicare HMO | Source: Ambulatory Visit | Attending: Internal Medicine | Admitting: Internal Medicine

## 2016-11-12 DIAGNOSIS — Z1231 Encounter for screening mammogram for malignant neoplasm of breast: Secondary | ICD-10-CM

## 2017-06-07 IMAGING — US US EXTREM LOW VENOUS BILAT
1 series · 14 of 24 positions shown · non-contrast
Comparison: None.

CLINICAL DATA: Bilateral lower extremity swelling

EXAM:
BILATERAL LOWER EXTREMITY VENOUS DUPLEX ULTRASOUND
TECHNIQUE: Doppler venous assessment of the bilateral lower extremity deep
venous system was performed, including characterization of spectral
flow, compressibility, and phasicity.

[Series 1: us extrem low venous bilat · 14 of 83 slices shown]
[im 1/83]
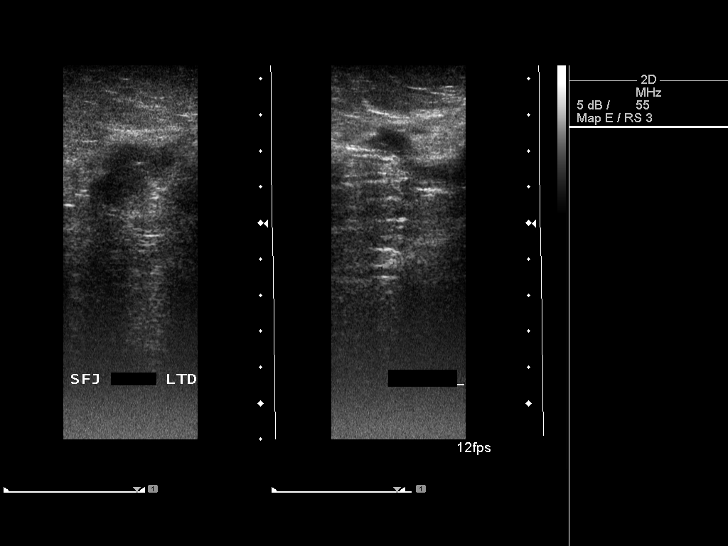
[im 8/83]
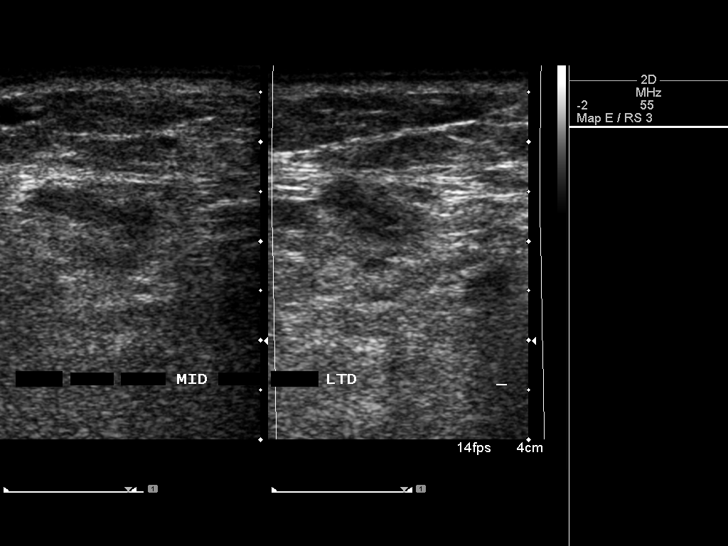
[im 15/83]
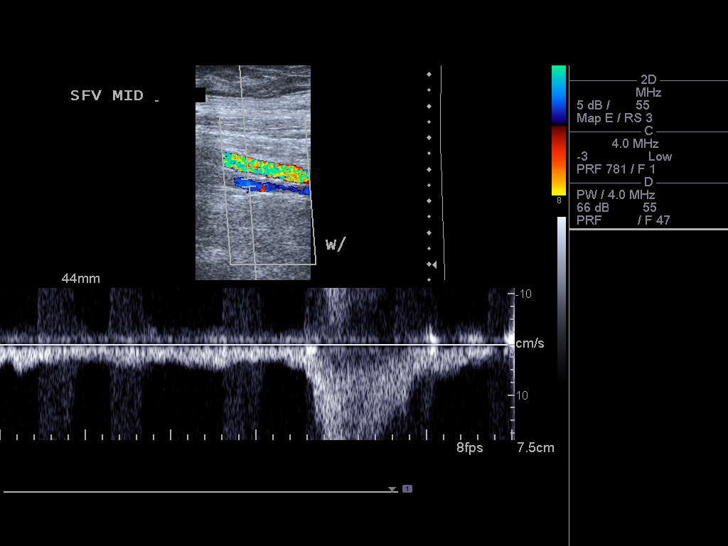
[im 22/83]
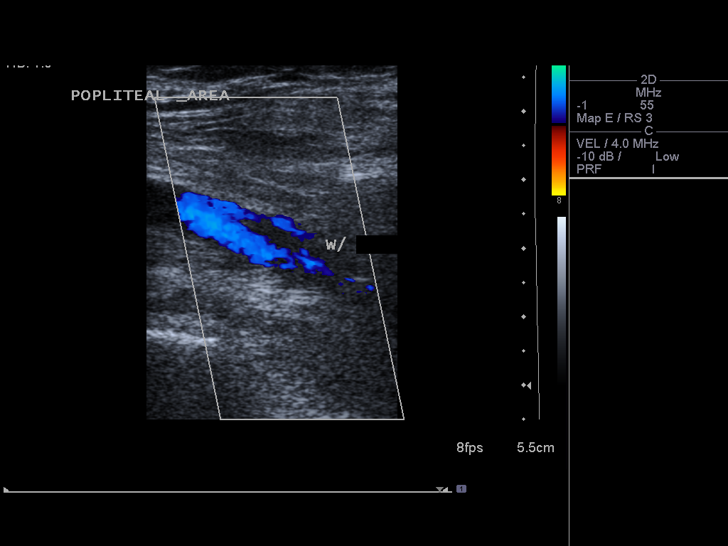
[im 25/83]
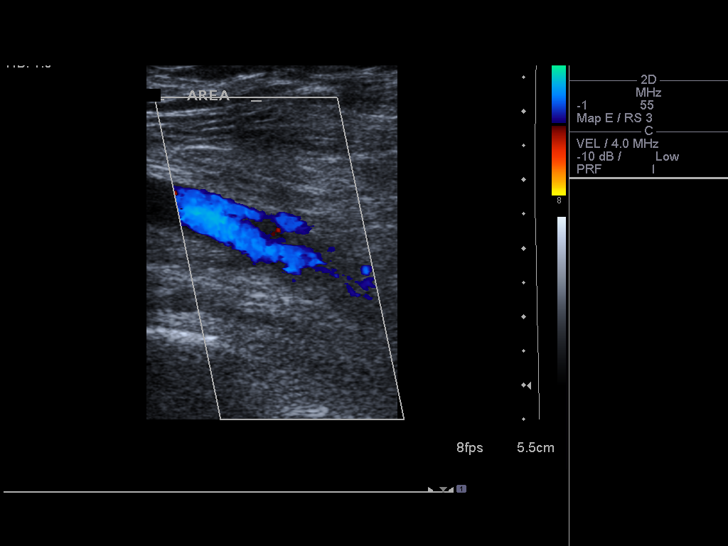
[im 33/83]
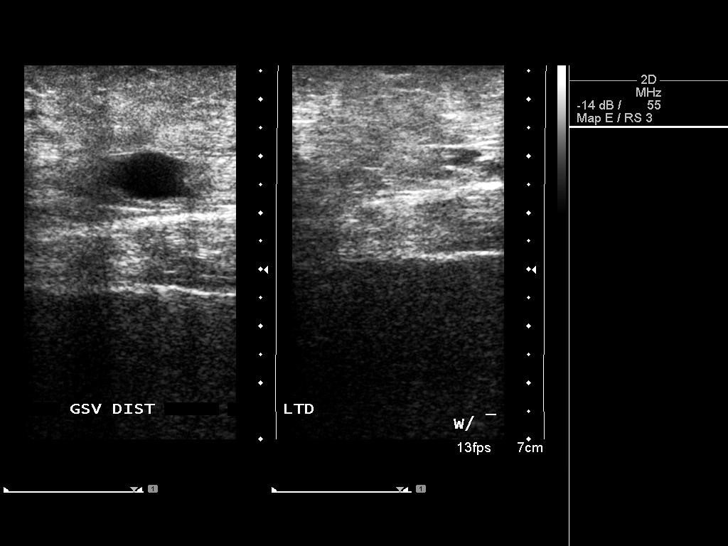
[im 40/83]
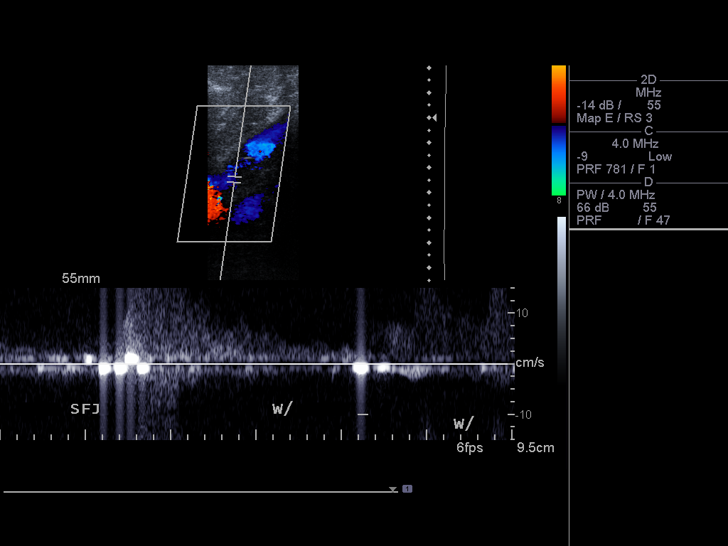
[im 43/83]
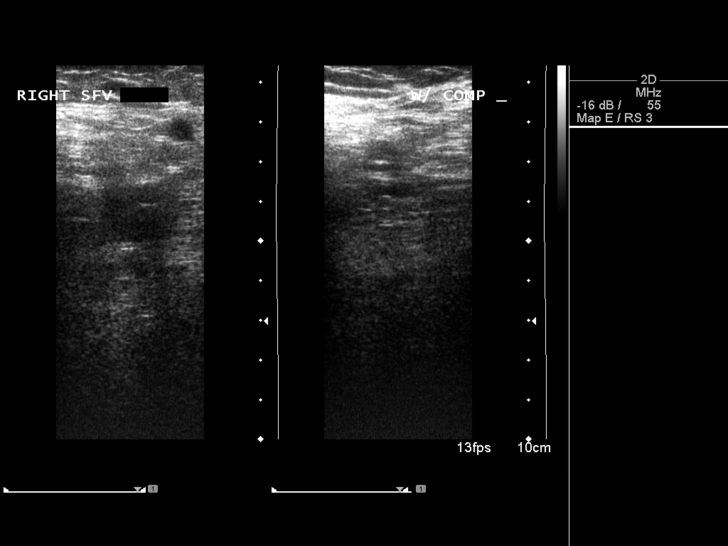
[im 50/83]
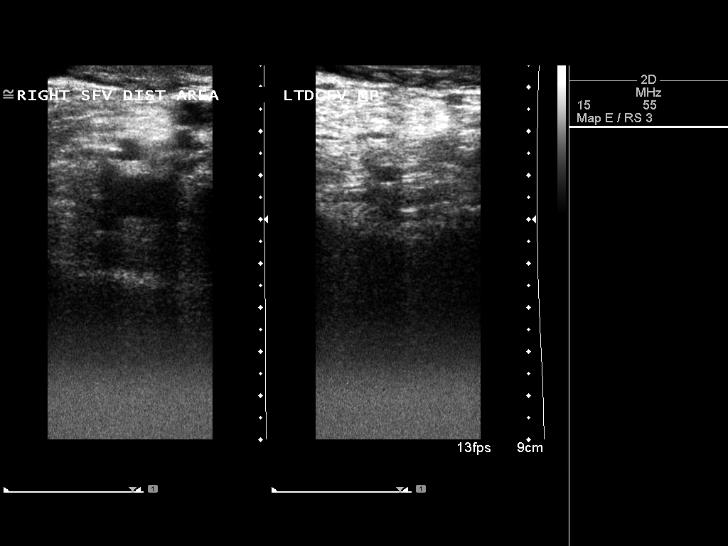
[im 58/83]
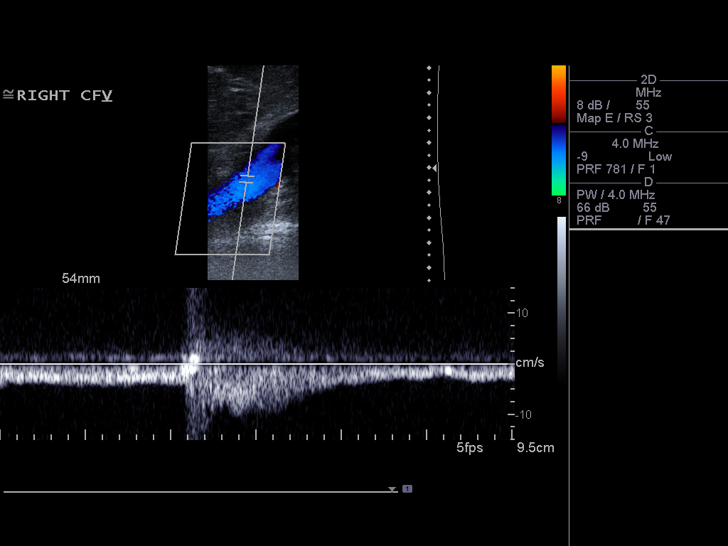
[im 65/83]
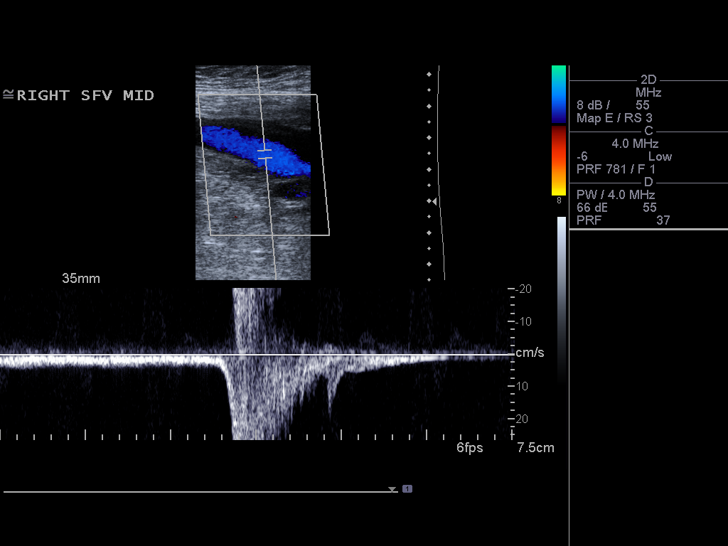
[im 68/83]
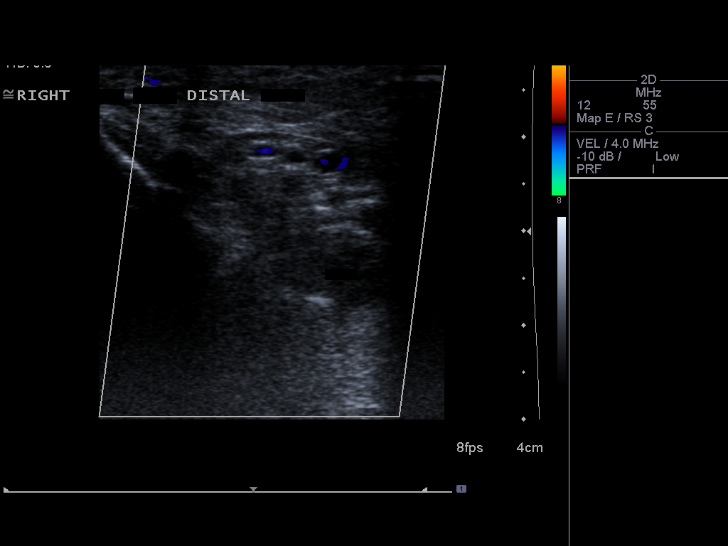
[im 75/83]
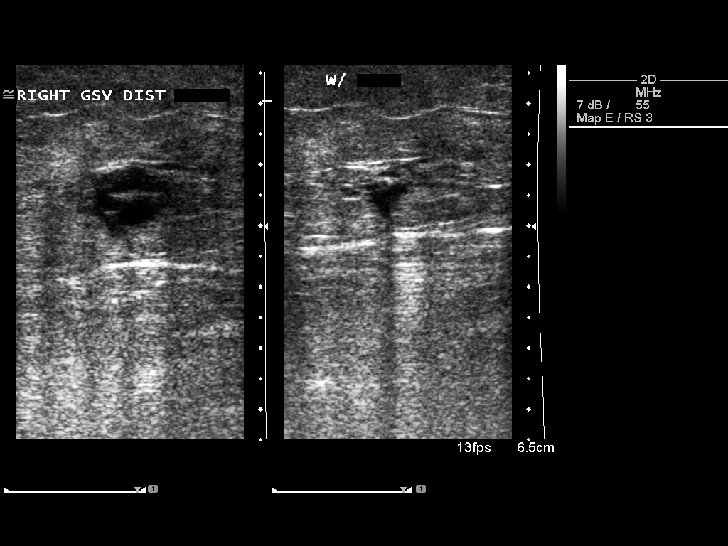
[im 83/83]
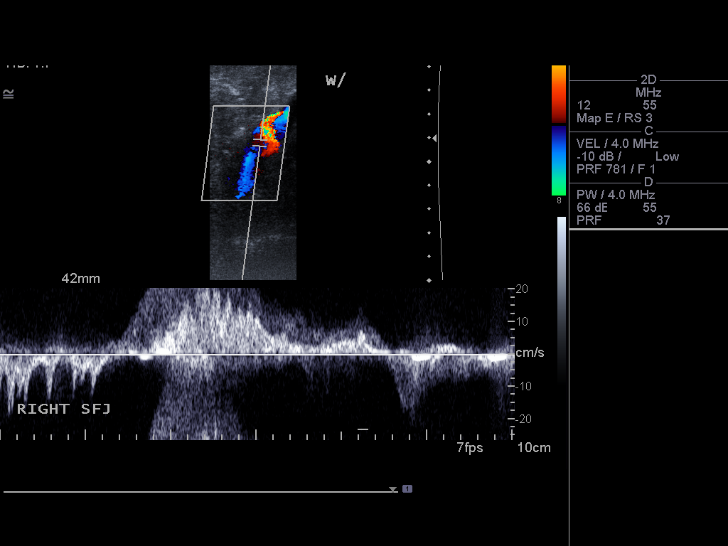

[14 of 24 positions shown; findings below may reference images not displayed]

FINDINGS: Exam was markedly limited by body habitus. There is gross
compressibility of the bilateral common femoral, femoral, and
popliteal veins. Bifurcation of the left popliteal vein is noted on
color flow imaging. Doppler analysis demonstrates diminished
respiratory phasicity. Augmentation of flow upon calf compression is
preserved. Greater saphenous veins are prominent. Varicosities are
present about the right ankle.
IMPRESSION: The study was limited. There is no obvious evidence of acute lower
extremity DVT.

Respiratory phasicity was dampened suggesting central venous
occlusion. CT from 7833 demonstrates chronic IVC occlusion.

Right ankle varicosities.

## 2017-11-04 ENCOUNTER — Other Ambulatory Visit: Payer: Self-pay | Admitting: Internal Medicine

## 2017-11-04 DIAGNOSIS — Z1231 Encounter for screening mammogram for malignant neoplasm of breast: Secondary | ICD-10-CM

## 2017-11-04 DIAGNOSIS — E2839 Other primary ovarian failure: Secondary | ICD-10-CM

## 2017-12-21 ENCOUNTER — Other Ambulatory Visit: Payer: Self-pay | Admitting: Internal Medicine

## 2017-12-21 DIAGNOSIS — M810 Age-related osteoporosis without current pathological fracture: Secondary | ICD-10-CM

## 2017-12-22 ENCOUNTER — Ambulatory Visit
Admission: RE | Admit: 2017-12-22 | Discharge: 2017-12-22 | Disposition: A | Discharge status: Home / Self Care | Payer: Medicare HMO | Source: Ambulatory Visit | Attending: Internal Medicine | Admitting: Internal Medicine

## 2017-12-22 ENCOUNTER — Ambulatory Visit
Admission: RE | Admit: 2017-12-22 | Discharge: 2017-12-22 | Disposition: A | Discharge status: Home / Self Care | Payer: Self-pay | Source: Ambulatory Visit | Attending: Internal Medicine | Admitting: Internal Medicine

## 2017-12-22 DIAGNOSIS — Z1231 Encounter for screening mammogram for malignant neoplasm of breast: Secondary | ICD-10-CM

## 2017-12-22 DIAGNOSIS — M810 Age-related osteoporosis without current pathological fracture: Secondary | ICD-10-CM

## 2017-12-23 ENCOUNTER — Other Ambulatory Visit: Payer: Self-pay | Admitting: Internal Medicine

## 2017-12-23 DIAGNOSIS — R928 Other abnormal and inconclusive findings on diagnostic imaging of breast: Secondary | ICD-10-CM

## 2017-12-29 ENCOUNTER — Ambulatory Visit
Admission: RE | Admit: 2017-12-29 | Discharge: 2017-12-29 | Disposition: A | Discharge status: Home / Self Care | Payer: Medicare HMO | Source: Ambulatory Visit | Attending: Internal Medicine | Admitting: Internal Medicine

## 2017-12-29 DIAGNOSIS — R928 Other abnormal and inconclusive findings on diagnostic imaging of breast: Secondary | ICD-10-CM

## 2018-01-23 IMAGING — DX DG CHEST 2V
2 series · 2 of 2 positions shown · non-contrast
Comparison: 09/12/2015

CLINICAL DATA: Shortness of breath and COPD.

EXAM:
CHEST  2 VIEW

[chest pa]
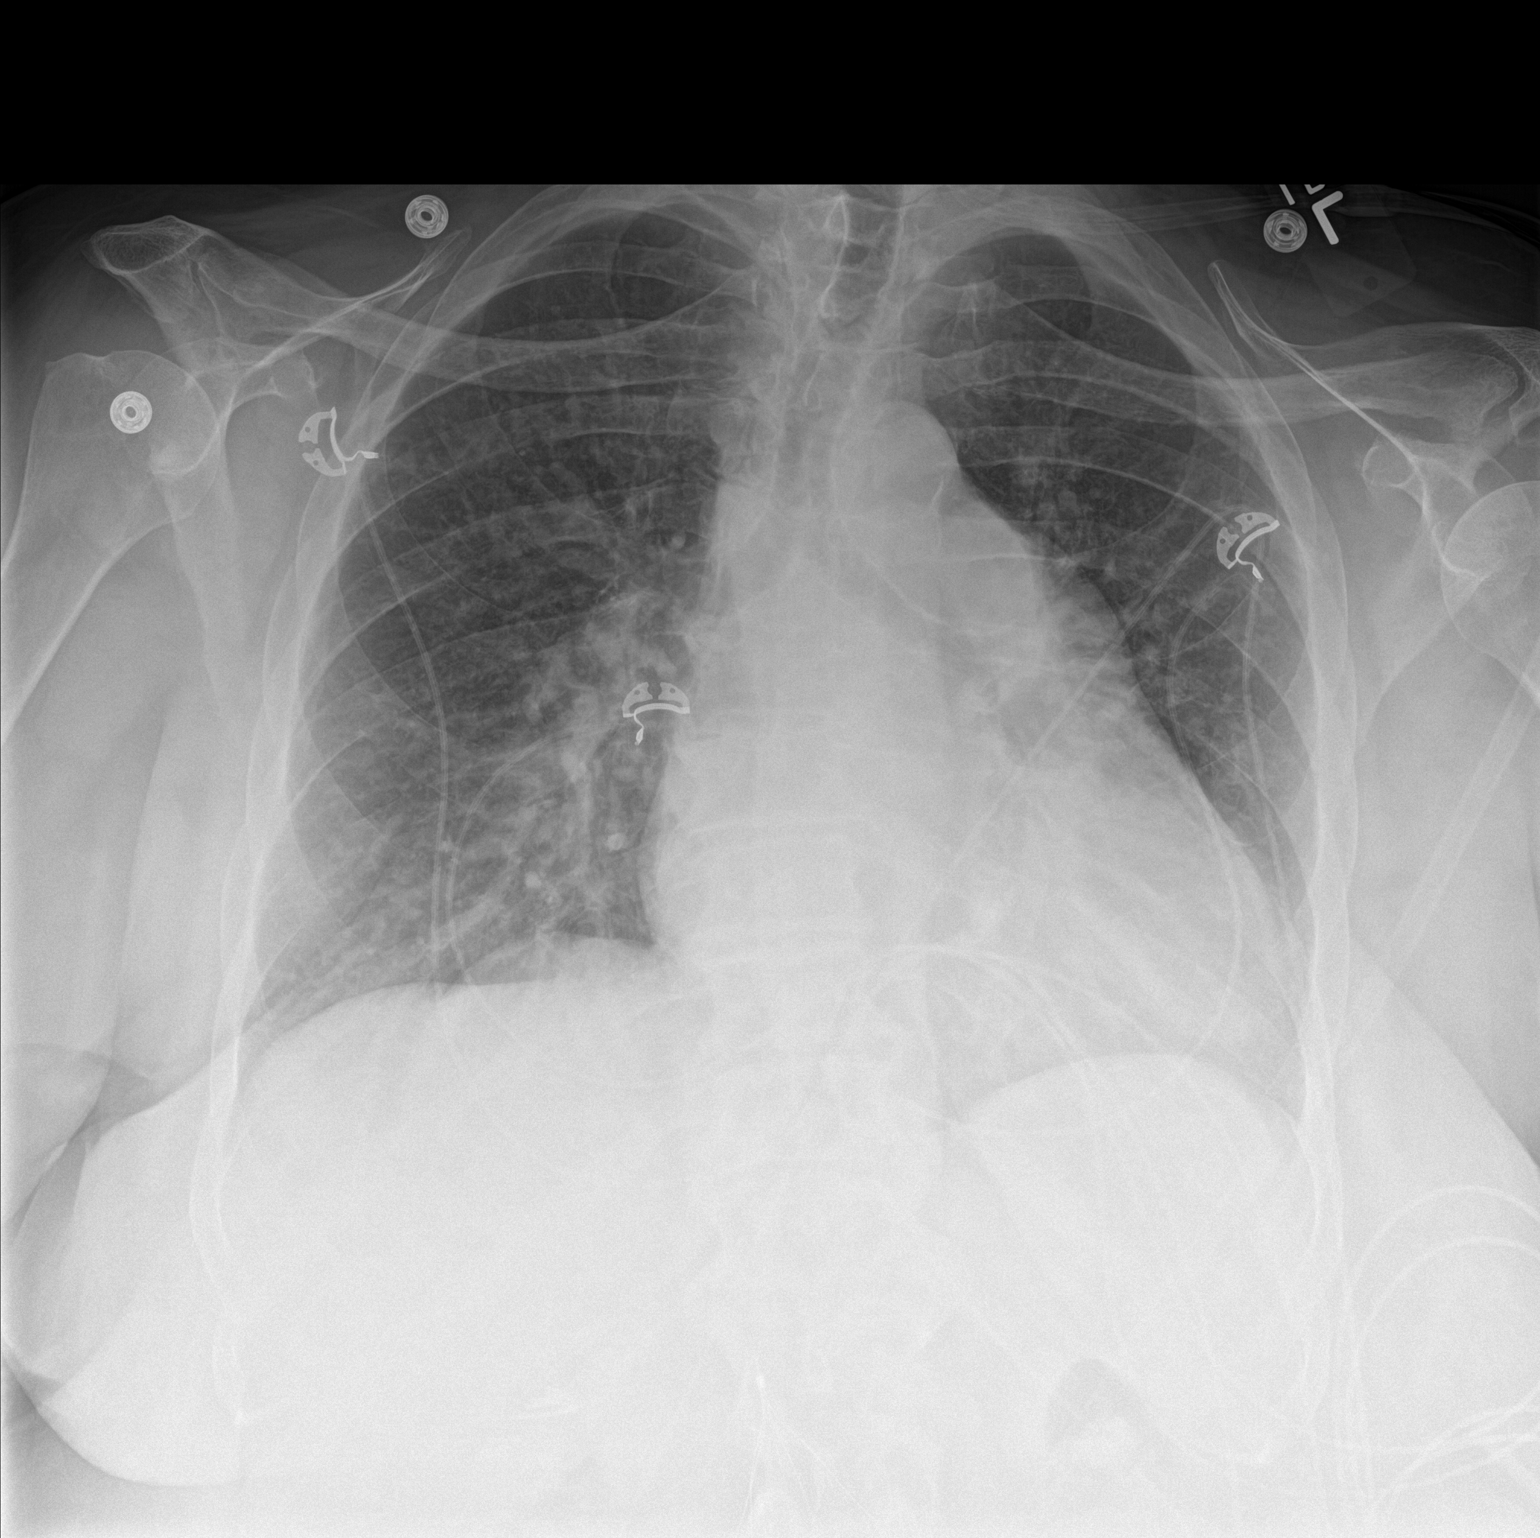

[chest lat]
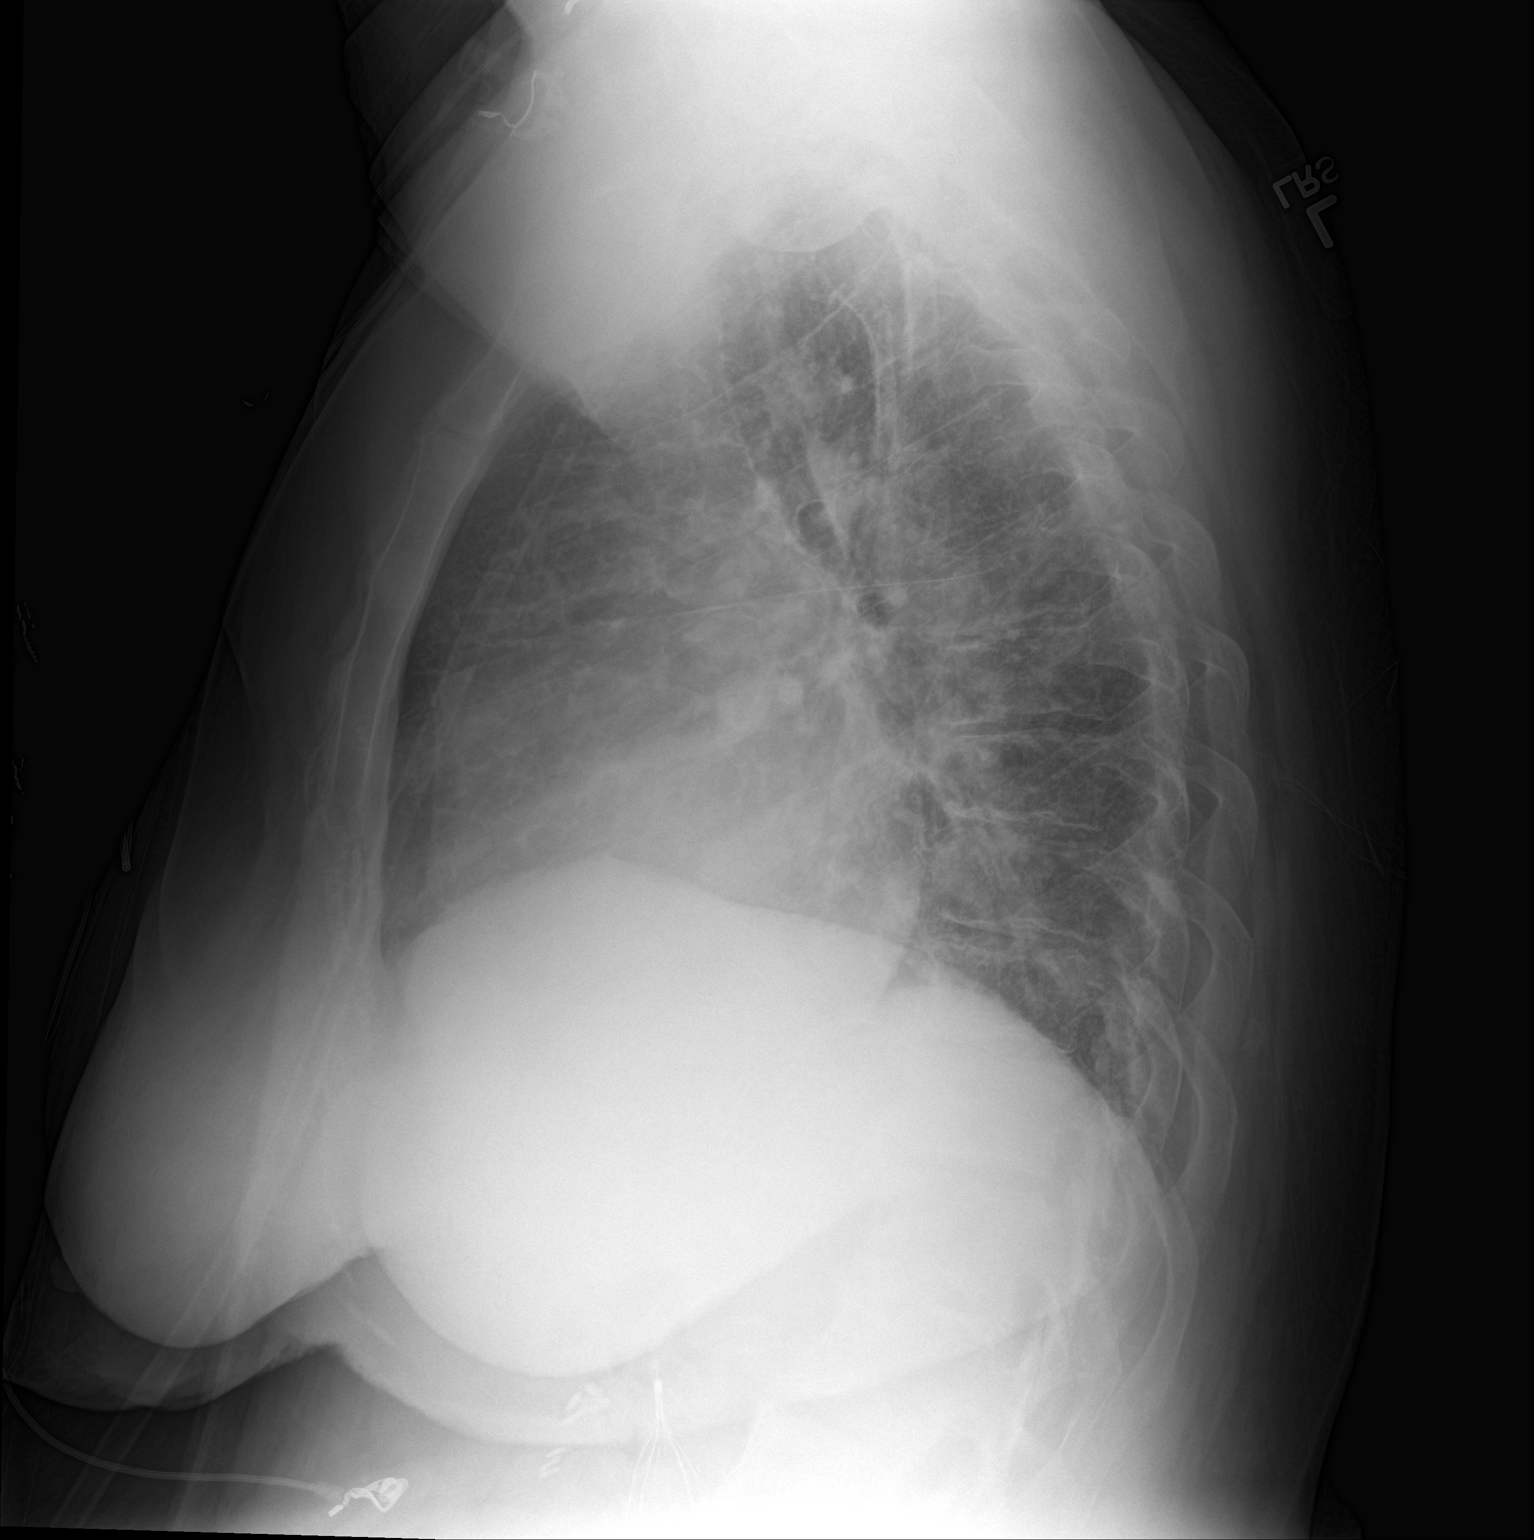

[2 of 2 positions shown; findings below may reference images not displayed]

FINDINGS: Stable mild cardiac enlargement. Relatively stable mild edema. No
focal airspace consolidation or pleural fluid identified.
IMPRESSION: Stable mild pulmonary edema.

## 2018-01-24 IMAGING — CT CT CHEST W/O CM
2 of 3 series · 15 of 36 positions shown, 18 images · non-contrast
Comparison: None.

CLINICAL DATA: Fever and cough.

EXAM:
CT CHEST WITHOUT CONTRAST
TECHNIQUE: Multidetector CT imaging of the chest was performed following the
standard protocol without IV contrast.

[Series 3: chest w/o 2mm st · axial · non-contrast · 0.73mm/px · z∈[-128,+124]mm · 12 of 150 slices shown, 15 images]
[im 12/150  mediastinal]
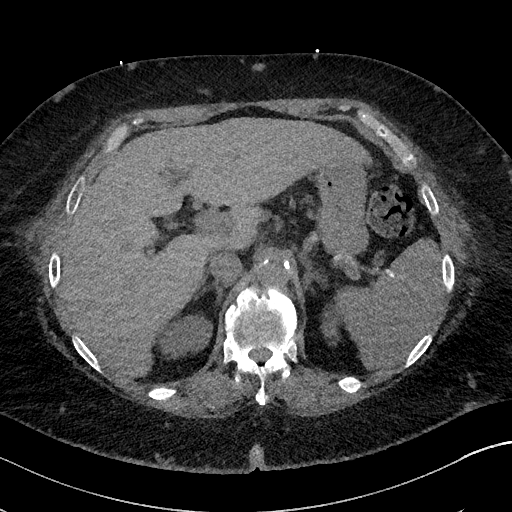
[im 12/150  lung]
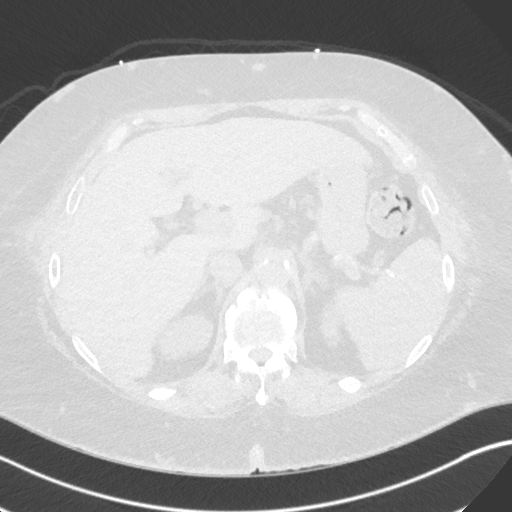
[im 23/150  lung]
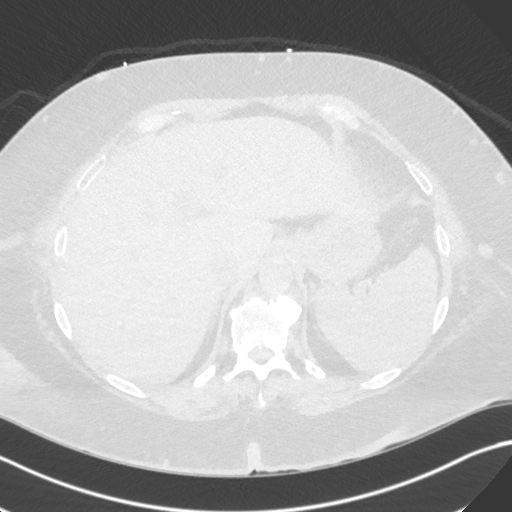
[im 34/150  lung]
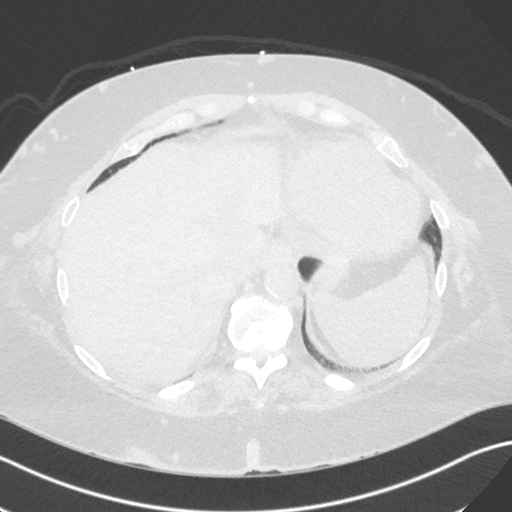
[im 45/150  lung]
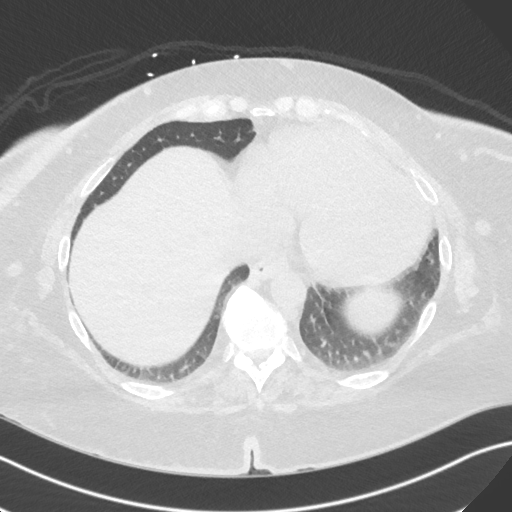
[im 56/150  mediastinal]
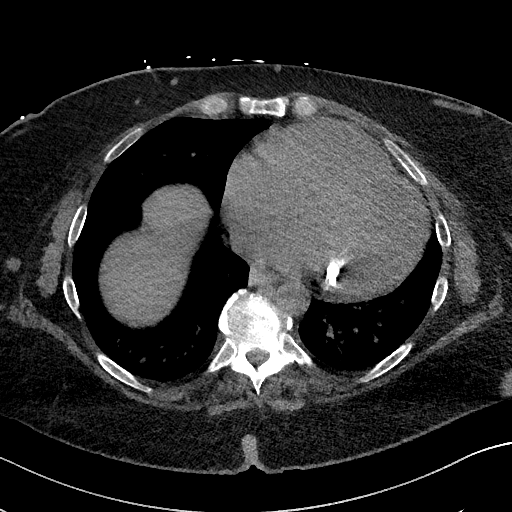
[im 56/150  lung]
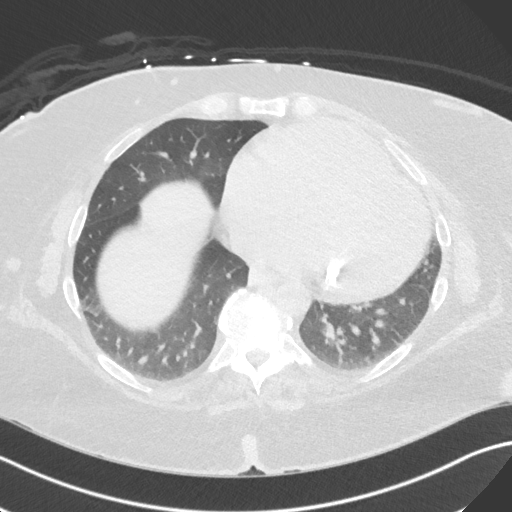
[im 67/150  lung]
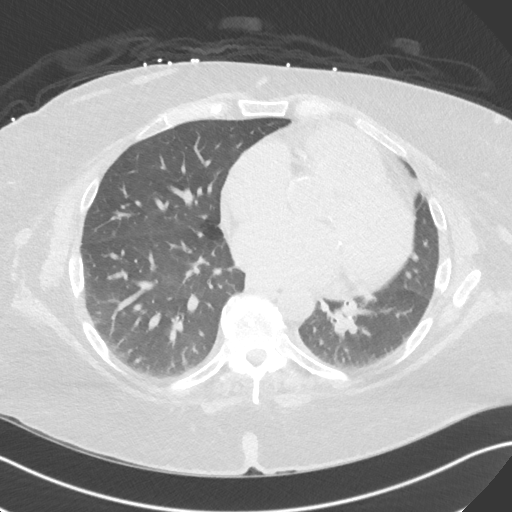
[im 83/150  lung]
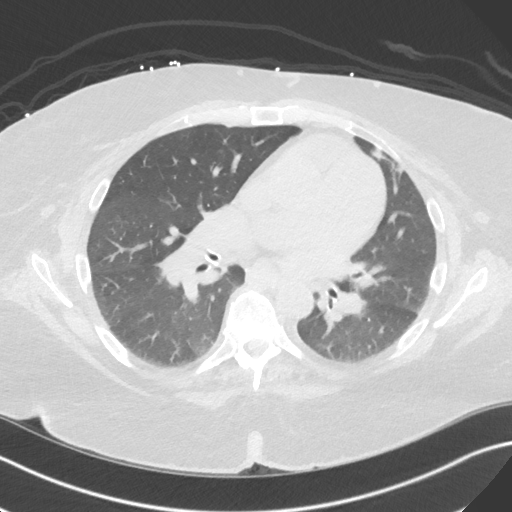
[im 94/150  lung]
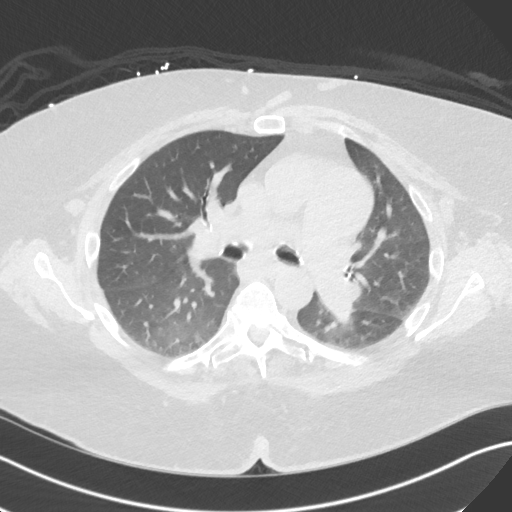
[im 105/150  mediastinal]
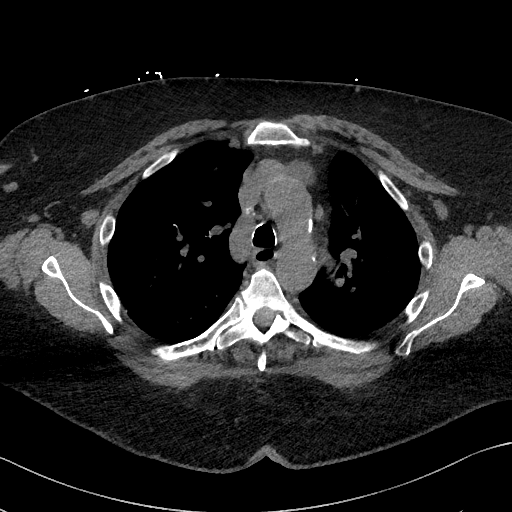
[im 105/150  lung]
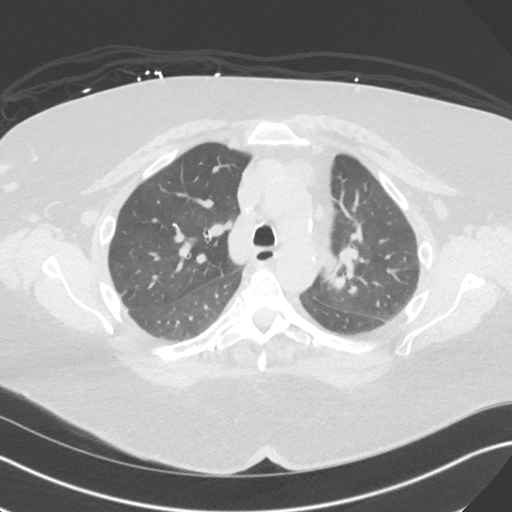
[im 116/150  lung]
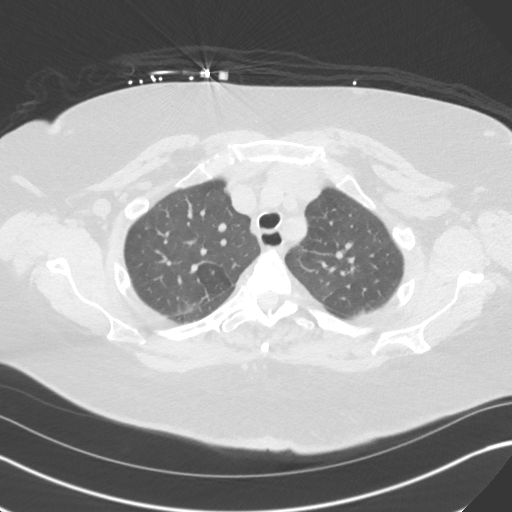
[im 127/150  lung]
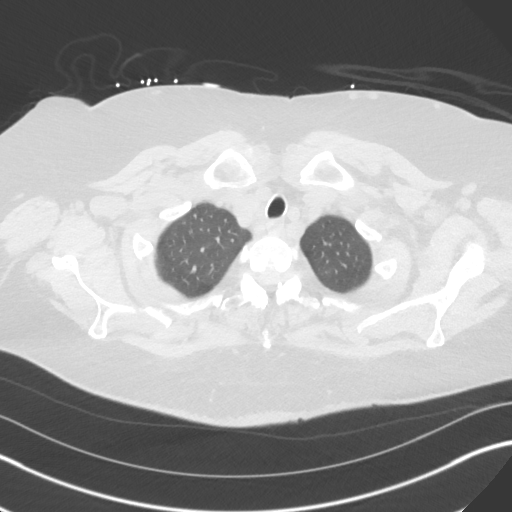
[im 138/150  lung]
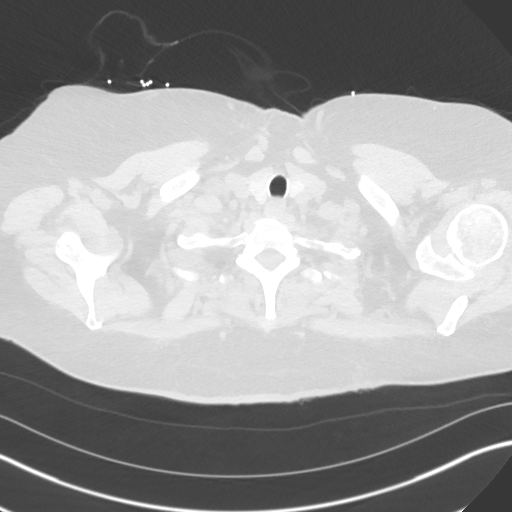

[Series 5: chest w/o 3mm st cor · coronal · non-contrast · 0.59mm/px · 3 of 101 slices shown]
[im 21/101  lung]
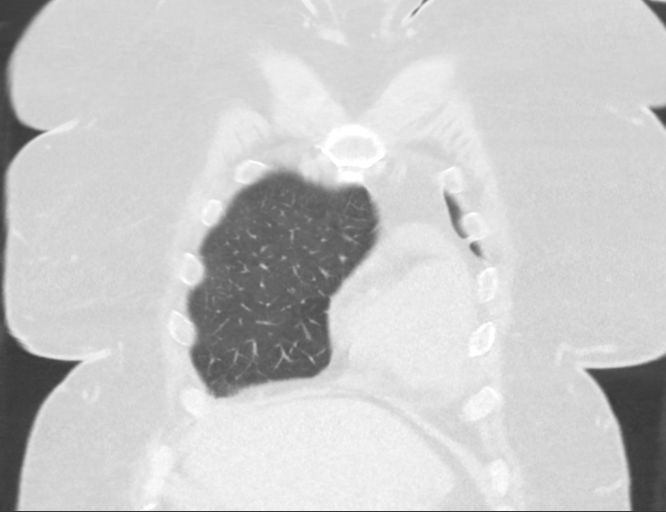
[im 41/101  lung]
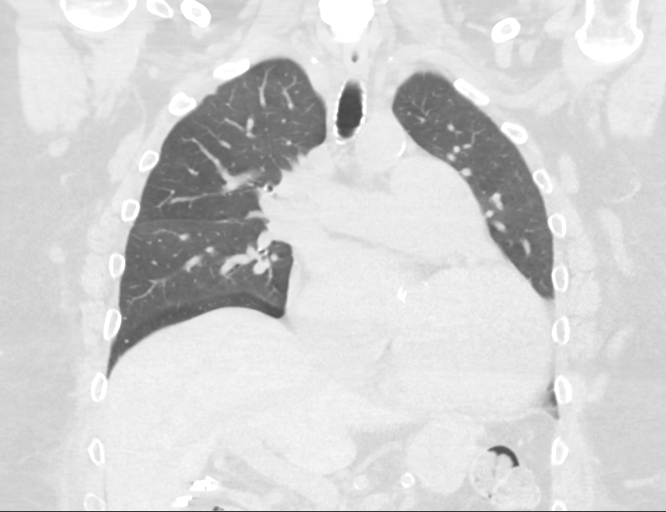
[im 61/101  lung]
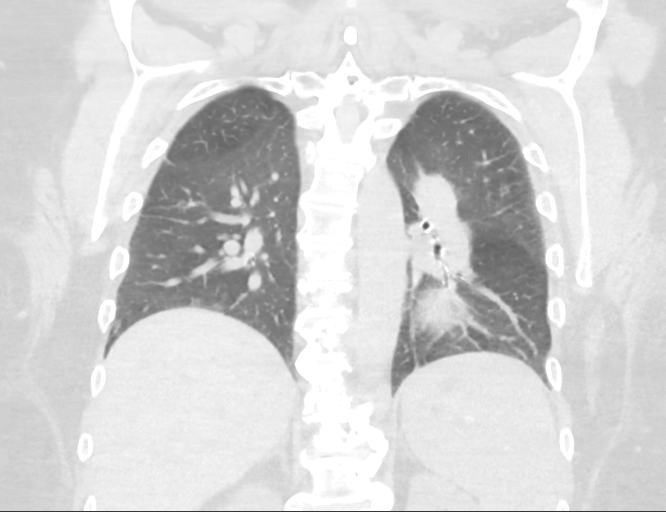

[15 of 36 positions shown; findings below may reference images not displayed]

FINDINGS: Mediastinum/Lymph Nodes: Moderate cardiac enlargement. There is
aortic atherosclerosis. Calcification in the LAD Coronary artery
noted. Mitral valve calcification identified. No mediastinal or
hilar adenopathy. The main pulmonary artery appears increased in
diameter measuring 4.9 cm. The right and left pulmonary arteries
appear prominent. Findings are suggestive of PA hypertension. The
trachea appears patent and is midline. Unremarkable appearance of
the esophagus.

Lungs/Pleura: There is a mosaic attenuation pattern identified in
both lungs. No airspace consolidation identified. No interstitial
edema or atelectasis. Perifissural nodule within the right middle
lobe measures 3 mm and is new from previous exam.

Upper abdomen: No acute findings.  No acute findings.

Musculoskeletal: Degenerative disc disease is noted throughout the
thoracic spine. No aggressive lytic or sclerotic bone lesions.
IMPRESSION: 1. Increase caliber of the main pulmonary artery. Correlate for any
clinical signs or symptoms of pulmonary arterial hypertension.
2. There is a mosaic attenuation pattern identified within both
lungs. This is a nonspecific finding but may be seen with small
airways disease as well as chronic pulmonary emboli.
3. 3 mm right middle lobe perifissural nodule peer No follow-up
needed if patient is low-risk. Non-contrast chest CT can be
considered in 12 months if patient is high-risk. This recommendation
follows the consensus statement: Guidelines for Management of
Incidental Pulmonary Nodules Detected on CT Images:From the
[HOSPITAL] 4535; published online before print
(10.1148/radiol.4596989893).
4. Thoracic degenerative disc disease.

## 2018-06-11 ENCOUNTER — Encounter (HOSPITAL_COMMUNITY): Payer: Self-pay

## 2018-06-11 ENCOUNTER — Emergency Department (HOSPITAL_COMMUNITY): Payer: Medicare HMO

## 2018-06-11 ENCOUNTER — Emergency Department (HOSPITAL_COMMUNITY)
Admission: EM | Admit: 2018-06-11 | Discharge: 2018-06-11 | Disposition: A | Discharge status: Home / Self Care | Payer: Medicare HMO | Attending: Emergency Medicine | Admitting: Emergency Medicine

## 2018-06-11 ENCOUNTER — Other Ambulatory Visit: Payer: Self-pay

## 2018-06-11 DIAGNOSIS — M5412 Radiculopathy, cervical region: Secondary | ICD-10-CM | POA: Diagnosis not present

## 2018-06-11 DIAGNOSIS — Z7982 Long term (current) use of aspirin: Secondary | ICD-10-CM | POA: Insufficient documentation

## 2018-06-11 DIAGNOSIS — E039 Hypothyroidism, unspecified: Secondary | ICD-10-CM | POA: Insufficient documentation

## 2018-06-11 DIAGNOSIS — M542 Cervicalgia: Secondary | ICD-10-CM | POA: Diagnosis present

## 2018-06-11 DIAGNOSIS — Z87891 Personal history of nicotine dependence: Secondary | ICD-10-CM | POA: Insufficient documentation

## 2018-06-11 DIAGNOSIS — I130 Hypertensive heart and chronic kidney disease with heart failure and stage 1 through stage 4 chronic kidney disease, or unspecified chronic kidney disease: Secondary | ICD-10-CM | POA: Insufficient documentation

## 2018-06-11 DIAGNOSIS — Z79899 Other long term (current) drug therapy: Secondary | ICD-10-CM | POA: Insufficient documentation

## 2018-06-11 DIAGNOSIS — M4802 Spinal stenosis, cervical region: Secondary | ICD-10-CM | POA: Diagnosis not present

## 2018-06-11 DIAGNOSIS — I5033 Acute on chronic diastolic (congestive) heart failure: Secondary | ICD-10-CM | POA: Insufficient documentation

## 2018-06-11 DIAGNOSIS — N183 Chronic kidney disease, stage 3 (moderate): Secondary | ICD-10-CM | POA: Diagnosis not present

## 2018-06-11 DIAGNOSIS — J441 Chronic obstructive pulmonary disease with (acute) exacerbation: Secondary | ICD-10-CM | POA: Insufficient documentation

## 2018-06-11 MED ORDER — DEXAMETHASONE SODIUM PHOSPHATE 10 MG/ML IJ SOLN
10.0000 mg | Freq: Once | INTRAMUSCULAR | Status: DC
  Filled 2018-06-11: qty 1

## 2018-06-11 MED ORDER — HYDROMORPHONE HCL 1 MG/ML IJ SOLN
0.5000 mg | Freq: Once | INTRAMUSCULAR | Status: DC
  Filled 2018-06-11: qty 1

## 2018-06-11 MED ORDER — OXYCODONE-ACETAMINOPHEN 5-325 MG PO TABS: 1.0000 | ORAL_TABLET | Freq: Four times a day (QID) | ORAL | 0 refills | Status: DC | PRN

## 2018-06-11 MED ORDER — HYDROMORPHONE HCL 1 MG/ML IJ SOLN
1.0000 mg | Freq: Once | INTRAMUSCULAR | Status: AC
  Administered 2018-06-11: 1 mg via INTRAMUSCULAR

## 2018-06-11 MED ORDER — OXYCODONE-ACETAMINOPHEN 5-325 MG PO TABS
2.0000 | ORAL_TABLET | Freq: Once | ORAL | Status: AC
  Administered 2018-06-11: 2 via ORAL
  Filled 2018-06-11: qty 2

## 2018-06-11 MED ORDER — METHYLPREDNISOLONE 4 MG PO TBPK: ORAL_TABLET | ORAL | 0 refills | Status: DC

## 2018-06-11 MED ORDER — DEXAMETHASONE SODIUM PHOSPHATE 4 MG/ML IJ SOLN
10.0000 mg | Freq: Once | INTRAMUSCULAR | Status: AC
  Administered 2018-06-11: 10 mg via INTRAVENOUS
  Filled 2018-06-11: qty 3

## 2018-06-11 NOTE — ED Notes (Signed)
Called PA-C [place] and she advised the workup could potentially be excessive with pt needing an MRI.

## 2018-06-11 NOTE — ED Notes (Signed)
Patient transported to X-ray 

## 2018-06-11 NOTE — ED Provider Notes (Addendum)
[facility] EMERGENCY DEPARTMENT Provider Note   CSN: [id number] Arrival date & time: 06/11/18  0160    History   Chief Complaint Chief Complaint  Patient presents with  . Shoulder Pain    HPI [name] is a 63 y.o. female.     The history is provided by the patient and the spouse.     57 yo F with PMHx as below including h/o AFib, DVT, CHF, known degenerative disc disease of C and L spine here with neck pain, R arm and shoulder pain. Pt reports that over the past few days, she has noticed progressively worsening, severe, R upper arm and paraspinal neck pain. She had a cough for several weeks prior to the onset of neck pain, but denies any overt episodes in which she felt any popping or trauma to her neck. Does not recall any falls or heavy lifting/twisting, though she does housework very frequently. She reports that over the past 24-48 hours, she's had worsening aching, sharp, stabbing pain along her R shoulder, with shooting burning pains down her R arm to her hands. She's had weakness with extension of her elbow and dorsiflexion of her hand, and felt tingling when she moves her head to the right. Pain is reproduced with movement of her neck. No LUE sx. No LE sx. No fever, chills, or recent infections. No h/o ivdu. No loss of bowel or bladder functions.  Past Medical History:  Diagnosis Date  . A-fib ([place])   . Anemia   . Anxiety   . Arthritis    "right hip; both knees; left wrist/shoulder; back" (01/19/2013"  . Bleeding on Coumadin 08/2012; 01/18/2013   [facility] admissions (01/19/2013)  . CHF (congestive heart failure) ([place])    "2-3 times" (01/19/2013)  . Chronic lower back pain   . Depression   . DVT (deep venous thrombosis) ([place]) 10 years ago   numerous/notes 01/18/2013  . GERD (gastroesophageal reflux disease)   . Gout   . FUXNATFT(732.2)    "maybe weekly" (01/19/2013)  . Heart murmur   . High cholesterol    "been off RX for this at one time"  (01/18/2013)  . History of blood transfusion 1983; 04/2012   "3 w/ childbirth; hospitalized for pain" (01/19/2013)  . Hypertension   . Hypothyroidism   . Migraines    "twice/yr maybe" (01/19/2013)  . OSA (obstructive sleep apnea)    "sent me for test in 04/2012; never ordered mask, etc" (01/19/2013)  . PE (pulmonary thromboembolism) ([place]) 3 years ag0   3/notes 01/18/2013  . Pneumonia before 2011   "once' (01/18/2013)  . Renal disorder    kindey function low; "Metformin was destroying my kidneys" (01/19/2013)  . Shortness of breath    "only related to my CHF" (01/18/2013)  . Swelling of hand 08/31/2014   RT HAND  . Type II diabetes mellitus ([place])   . UTI (urinary tract infection) 08/31/2014    Patient Active Problem List   Diagnosis Date Noted  . Influenza   . Influenza-like illness   . Acute on chronic diastolic congestive heart failure ([place])   . Atypical chest pain   . Hypoxemia 05/04/2016  . Hypoxia 05/04/2016  . CHF (congestive heart failure) ([place]) 09/12/2015  . COPD exacerbation ([place]) 09/12/2015  . Personal history of endocrine, metabolic or immunity disorder 04/03/2015  . Decreased mobility 04/03/2015  . DVT (deep venous thrombosis), bilateral 02/24/2015  . CKD (chronic kidney disease) stage 3, GFR 30-59 ml/min ([facility]) 02/24/2015  .  Long-term (current) use of anticoagulants 11/19/2014  . Osteoarthritis 08/31/2014  . Cellulitis of hand, right 08/31/2014  . Tremors  05/07/2012  . Hypoglycemia 05/07/2012  . Abdominal pain 05/04/2012  . Sleep apnea, has not started titration  05/03/2012  . UTI (lower urinary tract infection) 05/03/2012  . UTI (urinary tract infection) 05/03/2012  . DVT, bil in 08/2011 09/03/2011  . Anticoagulated on Coumadin, chronically 09/03/2011  . 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 Rectal bleeding 08/16/2011  . Diastolic dysfunction, grade 2, EF 65-70% May 2013 08/14/2011  . LBBB (left bundle  branch block) 08/14/2011  . Chronic renal insufficiency, stage IV (severe) - due to DM & HTN, SCr improving-1.6 08/14/2011    Class: Chronic  . Angina pectoris ([place]) 08/13/2011  . IDDM (insulin dependent diabetes mellitus) ([place]) 08/13/2011  . Morbid obesity, + sleep apnea 08/13/2011  . HTN (hypertension) 08/13/2011  . HLD (hyperlipidemia) 08/13/2011  . Anemia - due to GI bleed & chronic disease. 08/13/2011  . History of pulmonary embolism, recurrent, 3 seperate episodes. 08/13/2011  . Presence of IVC filter, placed June 2011 after 3d PE 08/13/2011  . Pulmonary HTN, Moderate  PA pressure 61mmHG 09/2009 08/13/2011  . Sinus bradycardia 08/13/2011    Past Surgical History:  Procedure Laterality Date  . CARDIAC CATHETERIZATION N/A 03/13/2015   Procedure: Right/Left Heart Cath and Coronary Angiography;  Surgeon: Adrian Prows, MD;  Location: [facility];  Service: Cardiovascular;  Laterality: N/A;  . CATARACT EXTRACTION W/ INTRAOCULAR LENS  IMPLANT, BILATERAL Bilateral 2006-2011  . CESAREAN SECTION  1983  . CHOLECYSTECTOMY  ~ 2002  . COLONOSCOPY N/A 01/21/2013   Procedure: COLONOSCOPY;  Surgeon: Beryle Beams, MD;  Location: [facility];  Service: Endoscopy;  Laterality: N/A;  . EYE SURGERY Bilateral    "multiple" (01/18/2013)  . PARS PLANA REPAIR OF RETINAL DEATACHMENT Right   . PARS PLANA VITRECTOMY Bilateral 2004-2006   "several" (01/18/2013)  . REFRACTIVE SURGERY Bilateral    "for stigmatism" (01/18/2013)  . REFRACTIVE SURGERY Left ~ 11/2012   "to puff it up cause vision got hazy" (01/18/2013)  . VENA CAVA FILTER PLACEMENT  2011?     OB History   No obstetric history on file.      Home Medications    Prior to Admission medications   Medication Sig Start Date End Date Taking? Authorizing Provider  acetaminophen (TYLENOL) 500 MG tablet Take 500 mg by mouth every 6 (six) hours as needed for fever or headache (pain).    [provider]  allopurinol (ZYLOPRIM) 300  MG tablet Take 300 mg by mouth daily.    [provider]  aspirin EC 81 MG tablet Take 81 mg by mouth daily.    [provider]  benzonatate (TESSALON) 100 MG capsule Take 1 capsule (100 mg total) by mouth 2 (two) times daily as needed for cough. 05/08/16   Rogue Bussing, MD  Biotin 1000 MCG tablet Take 1,000 mcg by mouth See admin instructions. Take 1 tablet (1000 mcg) by mouth every afternoon and evening    [provider]  CALCIUM CITRATE PO Take 600 mg by mouth See admin instructions. Take 1 tablet (600 mg) by mouth every afternoon and evening    [provider]  citalopram (CELEXA) 20 MG tablet Take 20 mg by mouth at bedtime.    [provider]  diazepam (VALIUM) 5 MG tablet Take 5 mg by mouth at bedtime as needed (back  spasms).    [provider]  enoxaparin (LOVENOX) 120 MG/0.8ML injection Inject 0.8 mLs (120 mg total) into the skin every 12 (twelve) hours. 05/08/16   Rogue Bussing, MD  Ferrous Sulfate Dried (SLOW RELEASE IRON) 45 MG TBCR Take 45 mg by mouth daily.    [provider]  hydrALAZINE (APRESOLINE) 25 MG tablet Take 25 mg by mouth 3 (three) times daily.    [provider]  HYDROcodone-acetaminophen (NORCO/VICODIN) 5-325 MG per tablet Take 1-2 tablets by mouth every 6 (six) hours as needed for severe pain. Patient taking differently: Take 0.5-1 tablets by mouth 2 (two) times daily as needed for severe pain.  09/04/14   Velvet Bathe, MD  insulin aspart (NOVOLOG) 100 UNIT/ML injection Inject 2 Units into the skin 3 (three) times daily with meals. Patient taking differently: Inject 6-10 Units into the skin 3 (three) times daily with meals.  09/04/14   Velvet Bathe, MD  Insulin Detemir (LEVEMIR FLEXTOUCH) 100 UNIT/ML Pen Inject 24 Units into the skin daily before breakfast.    [provider]  isosorbide mononitrate (IMDUR) 120 MG 24 hr tablet Take 120 mg by mouth daily.    [provider]  levothyroxine (SYNTHROID, LEVOTHROID) 50 MCG tablet Take 50 mcg by mouth daily before breakfast.     [provider]  Melatonin 5 MG TABS Take 5 mg by mouth at bedtime.    [provider]  methylPREDNISolone (MEDROL DOSEPAK) 4 MG TBPK tablet Take as directed on packaging 06/11/18   Duffy Bruce, MD  Multiple Vitamin (MULITIVITAMIN WITH MINERALS) TABS Take 1 tablet by mouth daily.    [provider]  Omega-3 Fatty Acids (FISH OIL) 1000 MG CAPS Take 1,000 mg by mouth See admin instructions. Take 1 capsule (1000 mg) by mouth every afternoon and evening    [provider]  oseltamivir (TAMIFLU) 30 MG capsule Take 1 capsule (30 mg total) by mouth 2 (two) times daily. 05/08/16   Rogue Bussing, MD  oseltamivir (TAMIFLU) 75 MG capsule Take 1 capsule (75 mg total) by mouth every 12 (twelve) hours. 05/04/16   Tanna Furry, MD  oxyCODONE-acetaminophen (PERCOCET/ROXICET) 5-325 MG tablet Take 1-2 tablets by mouth every 6 (six) hours as needed for moderate pain or severe pain. 06/11/18   Duffy Bruce, MD  rosuvastatin (CRESTOR) 10 MG tablet Take 1 tablet (10 mg total) by mouth daily. 05/09/16   Rogue Bussing, MD  torsemide (DEMADEX) 20 MG tablet Take 2 tablets (40 mg total) by mouth daily. 05/09/16   Rogue Bussing, MD  warfarin (COUMADIN) 2.5 MG tablet TAKE 7.5 MG ON M-W-F AND 5 MG TUES, THURS, SAT AND SUN AS DIRECTED Patient taking differently: TAKE 7.5 MG ON MON, WED, FRI, SAT AND TAKE 5 MG SUN, TUE, THU - AT 4PM - OR AS DIRECTED 02/10/16   Gordy Levan, MD    Family History Family History  Problem Relation Age of Onset  . Cerebral aneurysm Mother   . Hypertension Father   . Cerebral aneurysm Maternal Grandfather   . Cerebral aneurysm Maternal Aunt   . Cancer Maternal Uncle     Social History Social History   Tobacco Use  . Smoking status: Former Smoker    Packs/day: 0.05    Years: 30.00    Pack years: 1.50     Types: Cigarettes  . Smokeless tobacco: Never Used  . Tobacco comment: 01/19/2013 "quit smoking cigarettes in the early '90's"  Substance Use Topics  .  Alcohol use: Yes    Comment: 01/19/2013 "drank years ago; last drink 03/2012"  . Drug use: No     Allergies   Morphine and related   Review of Systems Review of Systems  Constitutional: Negative for chills and fever.  HENT: Negative for congestion, rhinorrhea and sore throat.   Eyes: Negative for visual disturbance.  Respiratory: Negative for cough, shortness of breath and wheezing.   Cardiovascular: Negative for chest pain and leg swelling.  Gastrointestinal: Negative for abdominal pain, diarrhea, nausea and vomiting.  Genitourinary: Negative for dysuria, flank pain, vaginal bleeding and vaginal discharge.  Musculoskeletal: Positive for neck pain and neck stiffness.  Skin: Negative for rash.  Allergic/Immunologic: Negative for immunocompromised state.  Neurological: Positive for weakness and numbness. Negative for syncope and headaches.  Hematological: Does not bruise/bleed easily.  All other systems reviewed and are negative.    Physical Exam Updated Vital Signs BP 134/83   Pulse 86   Temp 98 F (36.7 C) (Oral)   Resp 13   Ht 5\' 2"  (1.575 m)   Wt 71.2 kg   SpO2 96%   BMI 28.72 kg/m   Physical Exam Vitals signs and nursing note reviewed.  Constitutional:      General: She is not in acute distress.    Appearance: She is well-developed.  HENT:     Head: Normocephalic and atraumatic.  Eyes:     Conjunctiva/sclera: Conjunctivae normal.  Neck:     Musculoskeletal: Neck supple.     Comments: Significant TTP along midline and R?L paraspinal musculature, worse along mid/lower C-Spine. +Spurling on right with reproduction of R shoulder pain and burning paresthesias. Cardiovascular:     Rate and Rhythm: Normal rate and regular rhythm.     Heart sounds: Normal heart sounds. No murmur. No friction rub.  Pulmonary:      Effort: Pulmonary effort is normal. No respiratory distress.     Breath sounds: Normal breath sounds. No wheezing or rales.  Abdominal:     General: There is no distension.     Palpations: Abdomen is soft.     Tenderness: There is no abdominal tenderness.  Skin:    General: Skin is warm.     Capillary Refill: Capillary refill takes less than 2 seconds.  Neurological:     Mental Status: She is alert and oriented to person, place, and time.     Motor: No abnormal muscle tone.     Comments: Strength 5/5 throughout b/l UE, including ROM at shoulder, elbow, and hand w/ grip strength. ENdorses tingling but intact sensation along dorsum of hand and posterior/lateral forearm. Negative Hoffman's. LE 5/5 throughout. Normal reflexes UE and LE (2+).      ED Treatments / Results  Labs (all labs ordered are listed, but only abnormal results are displayed) Labs Reviewed - No data to display  EKG None  Radiology Dg Shoulder Right  Result Date: 06/11/2018 CLINICAL DATA:  No injury.pt cannot mover her Rt shoulder without severe pain shooting up her neck, into her jaw & felt throughout her teeth at times. From her Rt elbow to her fingertips has been numb the last two days as well. This has been at this level of pain & discomfort for the past two days. EXAM: RIGHT SHOULDER - 2+ VIEW COMPARISON:  None. FINDINGS: No fracture or bone lesion. Humeral head is mildly subluxed inferiorly in relation to the glenoid with narrowing of the inferior glenohumeral joint. Mild AC joint osteoarthritis. Bones are demineralized. Soft tissues  are unremarkable. IMPRESSION: 1. No fracture or dislocation.  No bone lesion. 2. Glenohumeral joint arthropathic changes, with narrowing consistent with cartilage loss. 3. Mild AC joint osteoarthritis. Electronically Signed   By: Lajean Manes M.D.   On: 06/11/2018 09:53   Mr Cervical Spine Wo Contrast  Result Date: 06/11/2018 CLINICAL DATA:  Known cervical spine stenosis with worsening  right upper extremity grip strength and paresthesia EXAM: MRI CERVICAL SPINE WITHOUT CONTRAST TECHNIQUE: Multiplanar, multisequence MR imaging of the cervical spine was performed. No intravenous contrast was administered. COMPARISON:  Cervical spine CT 09/08/2012 FINDINGS: Alignment: Slight anterolisthesis at C2-3 Vertebrae: No fracture, evidence of discitis, or bone lesion. Cord: Normal signal and morphology. Posterior Fossa, vertebral arteries, paraspinal tissues: Presumed retention cyst in the right maxillary sinus.Partial visualization of partially opacified right mastoid, chronic when compared to prior Disc levels: C2-3: Mild facet spurring asymmetric to the right. Patent canal and foramina C3-4: Disc narrowing with endplate and uncovertebral ridging. Degenerative facet spurring to a moderate degree. Spinal stenosis with mild ventral cord deformity. Advanced left and moderate right foraminal narrowing C4-5: Advanced degenerative disc narrowing with endplate and uncovertebral ridging. Disc osteophyte complex flattens the ventral cord. Biforaminal impingement that is worse on the right C5-6: Degenerative disc narrowing with endplate and uncovertebral ridging. Mild facet spurring. Spinal stenosis with mild ventral cord deformity. Mild bilateral foraminal narrowing C6-7: Disc narrowing with ridging. Posterior disc osteophyte complex with left paracentral superiorly migrating disc herniation that mildly deforms the ventral cord. The foramina are patent C7-T1:Tiny central disc protrusion without impingement. IMPRESSION: 1. Disc degeneration mainly from C3-4 to C6-7 with spinal stenosis causing mild ventral cord deformity at these levels. 2. Foraminal impingement bilaterally at C3-4 and C4-5. Electronically Signed   By: Monte Fantasia M.D.   On: 06/11/2018 11:12    Procedures Procedures (including critical care time)  Medications Ordered in ED Medications  dexamethasone (DECADRON) injection 10 mg (10 mg  Intravenous Given 06/11/18 1143)  HYDROmorphone (DILAUDID) injection 1 mg (1 mg Intramuscular Given 06/11/18 1130)  oxyCODONE-acetaminophen (PERCOCET/ROXICET) 5-325 MG per tablet 2 tablet (2 tablets Oral Given 06/11/18 1529)     Initial Impression / Assessment and Plan / ED Course  I have reviewed the triage vital signs and the nursing notes.  Pertinent labs & imaging results that were available during my care of the patient were reviewed by me and considered in my medical decision making (see chart for details).        63 yo F here with R upper arm and neck pain. I suspect this is 2/2 acute on chronic lateral foraminal stenosis, likely exacerbated 2/2 recent coughing illness/URI. No signs of acute central cord compression. No LE sx. No left sided sx or signs of central cord. MRI shows significant foraminal stenosis along distribution that fits with her sx. Do not suspect this is referred from shoulder, chest, or cardiac source. Pt feels much improved w/ analgesics here in ED. Will d/c with analgesics, steroids, and refer for Spine f/u for PT/OT and possible injections. This was discussed with Dr. Merceda Elks who is in agreement.  Final Clinical Impressions(s) / ED Diagnoses   Final diagnoses:  Cervical stenosis of spine  Cervical radiculopathy    ED Discharge Orders         Ordered    oxyCODONE-acetaminophen (PERCOCET/ROXICET) 5-325 MG tablet  Every 6 hours PRN     06/11/18 1332    methylPREDNISolone (MEDROL DOSEPAK) 4 MG TBPK tablet     06/11/18 1332  Duffy Bruce, MD 06/11/18 2017    Duffy Bruce, MD 06/11/18 2018

## 2018-06-11 NOTE — ED Notes (Signed)
0.5 mg dilaudid wasted with RN Arby Barrette.

## 2018-06-11 NOTE — ED Notes (Signed)
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

## 2018-06-11 NOTE — ED Triage Notes (Signed)
Pt stated that she was told several years ago (at this hospital) that she has a compressed disk in her neck, a degenerative disk in her lower back, her rt hip is squared off, & no cartilage in either knee (per pt). Her main complaint is that she cannot mover her Rt shoulder without severe pain shooting up her neck, into her jaw & felt throughout her teeth at times. From her Rt elbow to her fingertips has been numb the last two days as well. This has been at this level of pain & discomfort for the past two days.

## 2018-06-11 NOTE — Discharge Instructions (Addendum)
For your neck pain:  Continue your soft collar at night and during the day for comfort  I talked with our neurosurgeons who do not recommend a harder collar  Do not lift anything more than 10 lbs

## 2018-06-19 ENCOUNTER — Emergency Department (HOSPITAL_COMMUNITY)
Admission: EM | Admit: 2018-06-19 | Discharge: 2018-06-19 | Disposition: A | Discharge status: Home / Self Care | Payer: Medicare HMO | Attending: Emergency Medicine | Admitting: Emergency Medicine

## 2018-06-19 ENCOUNTER — Other Ambulatory Visit: Payer: Self-pay

## 2018-06-19 ENCOUNTER — Encounter (HOSPITAL_COMMUNITY): Payer: Self-pay

## 2018-06-19 DIAGNOSIS — E1122 Type 2 diabetes mellitus with diabetic chronic kidney disease: Secondary | ICD-10-CM | POA: Insufficient documentation

## 2018-06-19 DIAGNOSIS — Z79899 Other long term (current) drug therapy: Secondary | ICD-10-CM | POA: Diagnosis not present

## 2018-06-19 DIAGNOSIS — J449 Chronic obstructive pulmonary disease, unspecified: Secondary | ICD-10-CM | POA: Insufficient documentation

## 2018-06-19 DIAGNOSIS — Z794 Long term (current) use of insulin: Secondary | ICD-10-CM | POA: Insufficient documentation

## 2018-06-19 DIAGNOSIS — Z87891 Personal history of nicotine dependence: Secondary | ICD-10-CM | POA: Insufficient documentation

## 2018-06-19 DIAGNOSIS — Z7982 Long term (current) use of aspirin: Secondary | ICD-10-CM | POA: Insufficient documentation

## 2018-06-19 DIAGNOSIS — M79601 Pain in right arm: Secondary | ICD-10-CM | POA: Insufficient documentation

## 2018-06-19 DIAGNOSIS — I5032 Chronic diastolic (congestive) heart failure: Secondary | ICD-10-CM | POA: Insufficient documentation

## 2018-06-19 DIAGNOSIS — M542 Cervicalgia: Secondary | ICD-10-CM | POA: Diagnosis present

## 2018-06-19 DIAGNOSIS — M5412 Radiculopathy, cervical region: Secondary | ICD-10-CM | POA: Diagnosis not present

## 2018-06-19 DIAGNOSIS — N183 Chronic kidney disease, stage 3 (moderate): Secondary | ICD-10-CM | POA: Insufficient documentation

## 2018-06-19 DIAGNOSIS — I130 Hypertensive heart and chronic kidney disease with heart failure and stage 1 through stage 4 chronic kidney disease, or unspecified chronic kidney disease: Secondary | ICD-10-CM | POA: Insufficient documentation

## 2018-06-19 LAB — PROTIME-INR
INR: 3.7 — ABNORMAL HIGH (ref 0.8–1.2)
PROTHROMBIN TIME: 36.3 s — AB (ref 11.4–15.2)

## 2018-06-19 LAB — CBC WITH DIFFERENTIAL/PLATELET
Abs Immature Granulocytes: 0.02 10*3/uL (ref 0.00–0.07)
BASOS ABS: 0 10*3/uL (ref 0.0–0.1)
Basophils Relative: 0 %
EOS ABS: 0.2 10*3/uL (ref 0.0–0.5)
Eosinophils Relative: 3 %
HEMATOCRIT: 38 % (ref 36.0–46.0)
HEMOGLOBIN: 12 g/dL (ref 12.0–15.0)
IMMATURE GRANULOCYTES: 0 %
LYMPHS ABS: 1 10*3/uL (ref 0.7–4.0)
LYMPHS PCT: 12 %
MCH: 32.5 pg (ref 26.0–34.0)
MCHC: 31.6 g/dL (ref 30.0–36.0)
MCV: 103 fL — ABNORMAL HIGH (ref 80.0–100.0)
Monocytes Absolute: 0.8 10*3/uL (ref 0.1–1.0)
Monocytes Relative: 9 %
NEUTROS PCT: 76 %
NRBC: 0 % (ref 0.0–0.2)
Neutro Abs: 6.1 10*3/uL (ref 1.7–7.7)
PLATELETS: 152 10*3/uL (ref 150–400)
RBC: 3.69 MIL/uL — AB (ref 3.87–5.11)
RDW: 14.6 % (ref 11.5–15.5)
WBC: 8.1 10*3/uL (ref 4.0–10.5)

## 2018-06-19 LAB — BASIC METABOLIC PANEL
Anion gap: 10 (ref 5–15)
BUN: 44 mg/dL — AB (ref 8–23)
CHLORIDE: 100 mmol/L (ref 98–111)
CO2: 29 mmol/L (ref 22–32)
CREATININE: 1.42 mg/dL — AB (ref 0.44–1.00)
Calcium: 9.5 mg/dL (ref 8.9–10.3)
GFR calc Af Amer: 46 mL/min — ABNORMAL LOW (ref >60)
GFR, EST NON AFRICAN AMERICAN: 39 mL/min — AB (ref >60)
Glucose, Bld: 126 mg/dL — ABNORMAL HIGH (ref 70–99)
Potassium: 4 mmol/L (ref 3.5–5.1)
SODIUM: 139 mmol/L (ref 135–145)

## 2018-06-19 LAB — CBG MONITORING, ED: Glucose-Capillary: 124 mg/dL — ABNORMAL HIGH (ref 70–99)

## 2018-06-19 MED ORDER — FENTANYL CITRATE (PF) 100 MCG/2ML IJ SOLN
50.0000 ug | Freq: Once | INTRAMUSCULAR | Status: AC
  Administered 2018-06-19: 50 ug via INTRAVENOUS
  Filled 2018-06-19: qty 2

## 2018-06-19 MED ORDER — DEXAMETHASONE SODIUM PHOSPHATE 10 MG/ML IJ SOLN
10.0000 mg | Freq: Once | INTRAMUSCULAR | Status: AC
  Administered 2018-06-19: 10 mg via INTRAVENOUS
  Filled 2018-06-19: qty 1

## 2018-06-19 MED ORDER — HYDROCODONE-ACETAMINOPHEN 5-325 MG PO TABS: 1.0000 | ORAL_TABLET | Freq: Four times a day (QID) | ORAL | 0 refills | Status: DC | PRN

## 2018-06-19 MED ORDER — METHYLPREDNISOLONE 4 MG PO TBPK: ORAL_TABLET | ORAL | 0 refills | Status: DC

## 2018-06-19 MED ORDER — HYDROMORPHONE HCL 1 MG/ML IJ SOLN
1.0000 mg | Freq: Once | INTRAMUSCULAR | Status: AC
  Administered 2018-06-19: 1 mg via INTRAVENOUS
  Filled 2018-06-19: qty 1

## 2018-06-19 NOTE — ED Triage Notes (Signed)
Pt had MRI of back last Friday.

## 2018-06-19 NOTE — ED Provider Notes (Addendum)
[facility] DEPT Provider Note   CSN: [id number] Arrival date & time: 06/19/18  1925    History   Chief Complaint Chief Complaint  Patient presents with  . Neck Pain    HPI [name] is a 63 y.o. female.     The history is provided by the patient.  Illness  Location:  Neck pain, right arm pain Quality:  Pain, tingling Severity:  Severe Onset quality:  Gradual Duration:  2 weeks Timing:  Intermittent Progression:  Waxing and waning Chronicity:  Recurrent Context:  MRI two weeks ago that showed cervical spine disease and got sent home with pain medicine and is supposed to see spine this friday for further management. States pain is worsening since running out of her norco that was prescribed in the ED.  Relieved by:  Norco, rest Worsened by:  Movement Associated symptoms: no abdominal pain, no chest pain, no congestion, no cough, no diarrhea, no ear pain, no fatigue, no fever, no headaches, no loss of consciousness, no myalgias, no nausea, no rash, no rhinorrhea, no shortness of breath, no sore throat, no vomiting and no wheezing     Past Medical History:  Diagnosis Date  . A-fib ([place])   . Anemia   . Anxiety   . Arthritis    "right hip; both knees; left wrist/shoulder; back" (01/19/2013"  . Bleeding on Coumadin 08/2012; 01/18/2013   [facility] admissions (01/19/2013)  . CHF (congestive heart failure) ([place])    "2-3 times" (01/19/2013)  . Chronic lower back pain   . Depression   . DVT (deep venous thrombosis) ([place]) 10 years ago   numerous/notes 01/18/2013  . GERD (gastroesophageal reflux disease)   . Gout   . EPPIRJJO(841.6)    "maybe weekly" (01/19/2013)  . Heart murmur   . High cholesterol    "been off RX for this at one time" (01/18/2013)  . History of blood transfusion 1983; 04/2012   "3 w/ childbirth; hospitalized for pain" (01/19/2013)  . Hypertension   . Hypothyroidism   . Migraines    "twice/yr maybe" (01/19/2013)  .  OSA (obstructive sleep apnea)    "sent me for test in 04/2012; never ordered mask, etc" (01/19/2013)  . PE (pulmonary thromboembolism) ([place]) 3 years ag0   3/notes 01/18/2013  . Pneumonia before 2011   "once' (01/18/2013)  . Renal disorder    kindey function low; "Metformin was destroying my kidneys" (01/19/2013)  . Shortness of breath    "only related to my CHF" (01/18/2013)  . Swelling of hand 08/31/2014   RT HAND  . Type II diabetes mellitus ([place])   . UTI (urinary tract infection) 08/31/2014    Patient Active Problem List   Diagnosis Date Noted  . Influenza   . Influenza-like illness   . Acute on chronic diastolic congestive heart failure ([place])   . Atypical chest pain   . Hypoxemia 05/04/2016  . Hypoxia 05/04/2016  . CHF (congestive heart failure) ([place]) 09/12/2015  . COPD exacerbation ([place]) 09/12/2015  . Personal history of endocrine, metabolic or immunity disorder 04/03/2015  . Decreased mobility 04/03/2015  . DVT (deep venous thrombosis), bilateral 02/24/2015  . CKD (chronic kidney disease) stage 3, GFR 30-59 ml/min ([facility]) 02/24/2015  . Long-term (current) use of anticoagulants 11/19/2014  . Osteoarthritis 08/31/2014  . Cellulitis of hand, right 08/31/2014  . Tremors  05/07/2012  . Hypoglycemia 05/07/2012  . Abdominal pain 05/04/2012  . Sleep apnea, has not started titration  05/03/2012  . UTI (  lower urinary tract infection) 05/03/2012  . UTI (urinary tract infection) 05/03/2012  . DVT, bil in 08/2011 09/03/2011  . Anticoagulated on Coumadin, chronically 09/03/2011  . 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 Rectal bleeding 08/16/2011  . Diastolic dysfunction, grade 2, EF 65-70% May 2013 08/14/2011  . LBBB (left bundle branch block) 08/14/2011  . Chronic renal insufficiency, stage IV (severe) - due to DM & HTN, SCr improving-1.6 08/14/2011    Class: Chronic  . Angina pectoris ([place]) 08/13/2011  . IDDM (insulin dependent  diabetes mellitus) ([place]) 08/13/2011  . Morbid obesity, + sleep apnea 08/13/2011  . HTN (hypertension) 08/13/2011  . HLD (hyperlipidemia) 08/13/2011  . Anemia - due to GI bleed & chronic disease. 08/13/2011  . History of pulmonary embolism, recurrent, 3 seperate episodes. 08/13/2011  . Presence of IVC filter, placed June 2011 after 3d PE 08/13/2011  . Pulmonary HTN, Moderate  PA pressure 69mmHG 09/2009 08/13/2011  . Sinus bradycardia 08/13/2011    Past Surgical History:  Procedure Laterality Date  . CARDIAC CATHETERIZATION N/A 03/13/2015   Procedure: Right/Left Heart Cath and Coronary Angiography;  Surgeon: Adrian Prows, MD;  Location: [facility];  Service: Cardiovascular;  Laterality: N/A;  . CATARACT EXTRACTION W/ INTRAOCULAR LENS  IMPLANT, BILATERAL Bilateral 2006-2011  . CESAREAN SECTION  1983  . CHOLECYSTECTOMY  ~ 2002  . COLONOSCOPY N/A 01/21/2013   Procedure: COLONOSCOPY;  Surgeon: Beryle Beams, MD;  Location: [facility];  Service: Endoscopy;  Laterality: N/A;  . EYE SURGERY Bilateral    "multiple" (01/18/2013)  . PARS PLANA REPAIR OF RETINAL DEATACHMENT Right   . PARS PLANA VITRECTOMY Bilateral 2004-2006   "several" (01/18/2013)  . REFRACTIVE SURGERY Bilateral    "for stigmatism" (01/18/2013)  . REFRACTIVE SURGERY Left ~ 11/2012   "to puff it up cause vision got hazy" (01/18/2013)  . VENA CAVA FILTER PLACEMENT  2011?     OB History   No obstetric history on file.      Home Medications    Prior to Admission medications   Medication Sig Start Date End Date Taking? Authorizing Provider  acetaminophen (TYLENOL) 500 MG tablet Take 500 mg by mouth every 6 (six) hours as needed for fever or headache (pain).    [provider]  allopurinol (ZYLOPRIM) 300 MG tablet Take 300 mg by mouth daily.    [provider]  aspirin EC 81 MG tablet Take 81 mg by mouth daily.    [provider]  benzonatate (TESSALON) 100 MG capsule Take 1 capsule (100 mg  total) by mouth 2 (two) times daily as needed for cough. 05/08/16   Rogue Bussing, MD  Biotin 1000 MCG tablet Take 1,000 mcg by mouth See admin instructions. Take 1 tablet (1000 mcg) by mouth every afternoon and evening    [provider]  CALCIUM CITRATE PO Take 600 mg by mouth See admin instructions. Take 1 tablet (600 mg) by mouth every afternoon and evening    [provider]  citalopram (CELEXA) 20 MG tablet Take 20 mg by mouth at bedtime.    [provider]  diazepam (VALIUM) 5 MG tablet Take 5 mg by mouth at bedtime as needed (back spasms).    [provider]  enoxaparin (LOVENOX) 120 MG/0.8ML injection Inject 0.8 mLs (120 mg total) into the skin every 12 (twelve) hours. 05/08/16   Rogue Bussing, MD  Ferrous Sulfate Dried (SLOW RELEASE IRON) 45 MG TBCR Take  45 mg by mouth daily.    [provider]  hydrALAZINE (APRESOLINE) 25 MG tablet Take 25 mg by mouth 3 (three) times daily.    [provider]  HYDROcodone-acetaminophen (NORCO/VICODIN) 5-325 MG tablet Take 1 tablet by mouth every 6 (six) hours as needed for up to 15 doses. 06/19/18   Jazmynn Pho, DO  insulin aspart (NOVOLOG) 100 UNIT/ML injection Inject 2 Units into the skin 3 (three) times daily with meals. Patient taking differently: Inject 6-10 Units into the skin 3 (three) times daily with meals.  09/04/14   Velvet Bathe, MD  Insulin Detemir (LEVEMIR FLEXTOUCH) 100 UNIT/ML Pen Inject 24 Units into the skin daily before breakfast.    [provider]  isosorbide mononitrate (IMDUR) 120 MG 24 hr tablet Take 120 mg by mouth daily.    [provider]  levothyroxine (SYNTHROID, LEVOTHROID) 50 MCG tablet Take 50 mcg by mouth daily before breakfast.     [provider]  Melatonin 5 MG TABS Take 5 mg by mouth at bedtime.    [provider]  methylPREDNISolone (MEDROL DOSEPAK) 4 MG TBPK tablet Follow package instructions 06/19/18    Lennice Sites, DO  Multiple Vitamin (MULITIVITAMIN WITH MINERALS) TABS Take 1 tablet by mouth daily.    [provider]  Omega-3 Fatty Acids (FISH OIL) 1000 MG CAPS Take 1,000 mg by mouth See admin instructions. Take 1 capsule (1000 mg) by mouth every afternoon and evening    [provider]  oseltamivir (TAMIFLU) 30 MG capsule Take 1 capsule (30 mg total) by mouth 2 (two) times daily. 05/08/16   Rogue Bussing, MD  oseltamivir (TAMIFLU) 75 MG capsule Take 1 capsule (75 mg total) by mouth every 12 (twelve) hours. 05/04/16   Tanna Furry, MD  oxyCODONE-acetaminophen (PERCOCET/ROXICET) 5-325 MG tablet Take 1-2 tablets by mouth every 6 (six) hours as needed for moderate pain or severe pain. 06/11/18   Duffy Bruce, MD  rosuvastatin (CRESTOR) 10 MG tablet Take 1 tablet (10 mg total) by mouth daily. 05/09/16   Rogue Bussing, MD  torsemide (DEMADEX) 20 MG tablet Take 2 tablets (40 mg total) by mouth daily. 05/09/16   Rogue Bussing, MD  warfarin (COUMADIN) 2.5 MG tablet TAKE 7.5 MG ON M-W-F AND 5 MG TUES, THURS, SAT AND SUN AS DIRECTED Patient taking differently: TAKE 7.5 MG ON MON, WED, FRI, SAT AND TAKE 5 MG SUN, TUE, THU - AT 4PM - OR AS DIRECTED 02/10/16   Gordy Levan, MD    Family History Family History  Problem Relation Age of Onset  . Cerebral aneurysm Mother   . Hypertension Father   . Cerebral aneurysm Maternal Grandfather   . Cerebral aneurysm Maternal Aunt   . Cancer Maternal Uncle     Social History Social History   Tobacco Use  . Smoking status: Former Smoker    Packs/day: 0.05    Years: 30.00    Pack years: 1.50    Types: Cigarettes  . Smokeless tobacco: Never Used  . Tobacco comment: 01/19/2013 "quit smoking cigarettes in the early '90's"  Substance Use Topics  . Alcohol use: Yes    Comment: 01/19/2013 "drank years ago; last drink 03/2012"  . Drug use: No     Allergies   Morphine and related   Review of Systems  Review of Systems  Constitutional: Negative for chills, fatigue and fever.  HENT: Negative for congestion, ear pain, rhinorrhea and sore throat.   Eyes: Negative for pain  and visual disturbance.  Respiratory: Negative for cough, shortness of breath and wheezing.   Cardiovascular: Negative for chest pain and palpitations.  Gastrointestinal: Negative for abdominal pain, diarrhea, nausea and vomiting.  Genitourinary: Negative for dysuria and hematuria.  Musculoskeletal: Positive for neck pain. Negative for arthralgias, back pain and myalgias.       Right arm pain  Skin: Negative for color change and rash.  Neurological: Positive for numbness (tingling). Negative for dizziness, tremors, seizures, loss of consciousness, syncope, facial asymmetry, speech difficulty, weakness, light-headedness and headaches.  All other systems reviewed and are negative.    Physical Exam Updated Vital Signs  ED Triage Vitals  Enc Vitals Group     BP 06/19/18 1942 128/73     Pulse Rate 06/19/18 1942 97     Resp 06/19/18 1942 (!) 23     Temp 06/19/18 1942 98.2 F (36.8 C)     Temp src --      SpO2 06/19/18 1937 98 %     Weight 06/19/18 1940 257 lb (116.6 kg)     Height 06/19/18 1940 5\' 2"  (1.575 m)     Head Circumference --      Peak Flow --      Pain Score 06/19/18 1939 10     Pain Loc --      Pain Edu? --      Excl. in [place]? --     Physical Exam Vitals signs and nursing note reviewed.  Constitutional:      General: She is in acute distress.     Appearance: She is well-developed.  HENT:     Head: Normocephalic and atraumatic.     Nose: Nose normal.     Mouth/Throat:     Mouth: Mucous membranes are moist.  Eyes:     Extraocular Movements: Extraocular movements intact.     Conjunctiva/sclera: Conjunctivae normal.     Pupils: Pupils are equal, round, and reactive to light.  Neck:     Musculoskeletal: Normal range of motion and neck supple. Muscular tenderness present.     Comments: Tenderness  to paraspinal cervical muscles with increased tone Cardiovascular:     Rate and Rhythm: Normal rate and regular rhythm.     Pulses: Normal pulses.     Heart sounds: Normal heart sounds. No murmur.  Pulmonary:     Effort: Pulmonary effort is normal. No respiratory distress.     Breath sounds: Normal breath sounds.  Abdominal:     General: There is no distension.     Palpations: Abdomen is soft.     Tenderness: There is no abdominal tenderness.  Musculoskeletal: Normal range of motion.        General: Tenderness present.     Comments: Tender to palpation over muscles of right upper extremity but mostly in right shoulder area  Skin:    General: Skin is warm and dry.     Capillary Refill: Capillary refill takes less than 2 seconds.  Neurological:     General: No focal deficit present.     Mental Status: She is alert and oriented to person, place, and time.     Cranial Nerves: No cranial nerve deficit.     Sensory: No sensory deficit.     Coordination: Coordination normal.     Comments: Overall difficult to test muscle strength due to pain however patient appears to have 5+ out of 5 strength in the right upper extremity however pain is exacerbated with movement of this arm,  5+ out of 5 strength in the left upper extremity, normal sensation throughout, normal speech  Psychiatric:        Mood and Affect: Mood normal.      ED Treatments / Results  Labs (all labs ordered are listed, but only abnormal results are displayed) Labs Reviewed  CBC WITH DIFFERENTIAL/PLATELET - Abnormal; Notable for the following components:      Result Value   RBC 3.69 (*)    MCV 103.0 (*)    All other components within normal limits  BASIC METABOLIC PANEL - Abnormal; Notable for the following components:   Glucose, Bld 126 (*)    BUN 44 (*)    Creatinine, Ser 1.42 (*)    GFR calc non Af Amer 39 (*)    GFR calc Af Amer 46 (*)    All other components within normal limits  PROTIME-INR - Abnormal; Notable  for the following components:   Prothrombin Time 36.3 (*)    INR 3.7 (*)    All other components within normal limits  CBG MONITORING, ED - Abnormal; Notable for the following components:   Glucose-Capillary 124 (*)    All other components within normal limits    EKG EKG Interpretation  Date/Time:  Saturday June 19 2018 21:28:00 EDT Ventricular Rate:  50 PR Interval:    QRS Duration: 162 QT Interval:  475 QTC Calculation: 434 R Axis:   -96 Text Interpretation:  Atrial flutter with predominant 4:1 AV block Nonspecific IVCD with LAD Anterolateral infarct, old unchanged from prior/hx of afib/aflutter Confirmed by Lennice Sites [phone]) on 06/19/2018 9:37:57 PM   Radiology No results found.  Procedures Procedures (including critical care time)  Medications Ordered in ED Medications  HYDROmorphone (DILAUDID) injection 1 mg (1 mg Intravenous Given 06/19/18 2035)  dexamethasone (DECADRON) injection 10 mg (10 mg Intravenous Given 06/19/18 2123)  fentaNYL (SUBLIMAZE) injection 50 mcg (50 mcg Intravenous Given 06/19/18 2145)     Initial Impression / Assessment and Plan / ED Course  I have reviewed the triage vital signs and the nursing notes.  Pertinent labs & imaging results that were available during my care of the patient were reviewed by me and considered in my medical decision making (see chart for details).     DAYANE HILLENBURG is a 63 year old female history of arthritis, A. fib on Coumadin, high cholesterol who presents to the ED with worsening neck pain, right upper extremity pain.  Patient with normal vitals.  No fever.  Pain has been intermittent over the last 2 to 3 weeks.  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  Disease appeared to be worse on the right side which is where she has had worsening symptoms.  Patient is supposed to be following with neurosurgery this week.   She was given Solu-Medrol Dosepak and Norco which helped her symptoms.  However, she has completed this treatment course.  Pain continues to be intermittent and slightly worse today.  Denies any new trauma.  Overall she appears to have normal strength and sensation in bilateral upper extremities.  However, she does have significant pain with any movement of the right upper extremity and to palpation of anterior chest wall muscles.  This appears consistent with possible muscle spasm but this is likely neuropathic/radicular pain.  Patient overall has normal neurological exam otherwise.  She has good pulses throughout.  No concern for vascular process.  Likely  this is cervical disc disease in her spine.  Contacted neurosurgery NP came he ran who recommends continued pain management and a dose of IV Decadron.  No need for repeat imaging at this time.  Imaging overall does not indicate emergent need for surgery as well as exam appears to be stable. If pain can be controlled we can continue outpatient follow-up and would recommend that patient call Monday to get earlier appointment.  Patient had blood work done in case admission was needed that was overall unremarkable.  Her INR supratherapeutic at 3.7.  Patient will hold her dose and follow-up with her primary care doctor Monday for further pain management and INR recheck.  Patient felt much improved after IV Dilaudid, IV Decadron, IV fentanyl.  Patient does not have any current narcotic scripts.  Given her severe symptoms will write another prescription for Norco.  Patient was given education about narcotic use.  Will give another prescription for Solu-Medrol as well.  Recommend follow-up with primary care doctor on Monday and recommend that she try to get an earlier appointment with neurosurgery.  She is discharged from ED in good condition.  Given return precautions.  This chart was dictated using voice recognition software.  Despite best efforts to proofread,   errors can occur which can change the documentation meaning.    Final Clinical Impressions(s) / ED Diagnoses   Final diagnoses:  Cervical radiculopathy    ED Discharge Orders         Ordered    HYDROcodone-acetaminophen (NORCO/VICODIN) 5-325 MG tablet  Every 6 hours PRN     06/19/18 2317    methylPREDNISolone (MEDROL DOSEPAK) 4 MG TBPK tablet     06/19/18 2317           Clair Bardwell, [place], DO 06/19/18 [place], [place], DO 06/19/18 2327

## 2018-06-19 NOTE — Discharge Instructions (Addendum)
Take medication as prescribed.  Call neurosurgery to see if he can get an earlier appointment.F ollow-up with your primary care doctor Monday to have your INR rechecked. Hold coumadin until then.

## 2018-06-19 NOTE — ED Triage Notes (Signed)
Per EMS,  Pt has a htx of herniated disks in the back. Pt normally takes oxycodone at home for pain control. Pt ran out of medication Monday, has been taking tylenol to manage pain since. Unable to tolerate pain today.

## 2018-08-20 ENCOUNTER — Telehealth: Payer: Self-pay | Admitting: *Deleted

## 2018-08-20 NOTE — Telephone Encounter (Signed)
REFERRAL SENT TO SCHEDULING AND NOTES ON FILE FROM DR. [name] [phone]

## 2018-08-29 ENCOUNTER — Encounter (HOSPITAL_COMMUNITY): Payer: Self-pay | Admitting: Internal Medicine

## 2018-08-29 ENCOUNTER — Inpatient Hospital Stay (HOSPITAL_COMMUNITY)
Admission: EM | Admit: 2018-08-29 | Discharge: 2018-09-03 | DRG: 243 | Disposition: A | Discharge status: Home Health | Payer: Medicare HMO | Attending: Internal Medicine | Admitting: Internal Medicine

## 2018-08-29 ENCOUNTER — Other Ambulatory Visit: Payer: Self-pay

## 2018-08-29 ENCOUNTER — Emergency Department (HOSPITAL_COMMUNITY): Payer: Medicare HMO

## 2018-08-29 DIAGNOSIS — I10 Essential (primary) hypertension: Secondary | ICD-10-CM | POA: Diagnosis present

## 2018-08-29 DIAGNOSIS — J449 Chronic obstructive pulmonary disease, unspecified: Secondary | ICD-10-CM | POA: Diagnosis present

## 2018-08-29 DIAGNOSIS — N179 Acute kidney failure, unspecified: Secondary | ICD-10-CM | POA: Diagnosis present

## 2018-08-29 DIAGNOSIS — Z961 Presence of intraocular lens: Secondary | ICD-10-CM | POA: Diagnosis present

## 2018-08-29 DIAGNOSIS — Z7901 Long term (current) use of anticoagulants: Secondary | ICD-10-CM | POA: Diagnosis not present

## 2018-08-29 DIAGNOSIS — E7800 Pure hypercholesterolemia, unspecified: Secondary | ICD-10-CM | POA: Diagnosis present

## 2018-08-29 DIAGNOSIS — S12500A Unspecified displaced fracture of sixth cervical vertebra, initial encounter for closed fracture: Secondary | ICD-10-CM | POA: Diagnosis present

## 2018-08-29 DIAGNOSIS — I5189 Other ill-defined heart diseases: Secondary | ICD-10-CM | POA: Diagnosis not present

## 2018-08-29 DIAGNOSIS — S91115A Laceration without foreign body of left lesser toe(s) without damage to nail, initial encounter: Secondary | ICD-10-CM | POA: Diagnosis present

## 2018-08-29 DIAGNOSIS — I130 Hypertensive heart and chronic kidney disease with heart failure and stage 1 through stage 4 chronic kidney disease, or unspecified chronic kidney disease: Secondary | ICD-10-CM | POA: Diagnosis present

## 2018-08-29 DIAGNOSIS — S12501A Unspecified nondisplaced fracture of sixth cervical vertebra, initial encounter for closed fracture: Secondary | ICD-10-CM | POA: Diagnosis not present

## 2018-08-29 DIAGNOSIS — R569 Unspecified convulsions: Secondary | ICD-10-CM | POA: Diagnosis present

## 2018-08-29 DIAGNOSIS — Z8249 Family history of ischemic heart disease and other diseases of the circulatory system: Secondary | ICD-10-CM

## 2018-08-29 DIAGNOSIS — Z794 Long term (current) use of insulin: Secondary | ICD-10-CM | POA: Diagnosis not present

## 2018-08-29 DIAGNOSIS — Z86711 Personal history of pulmonary embolism: Secondary | ICD-10-CM | POA: Diagnosis not present

## 2018-08-29 DIAGNOSIS — W1789XA Other fall from one level to another, initial encounter: Secondary | ICD-10-CM | POA: Diagnosis present

## 2018-08-29 DIAGNOSIS — E039 Hypothyroidism, unspecified: Secondary | ICD-10-CM | POA: Diagnosis present

## 2018-08-29 DIAGNOSIS — Z885 Allergy status to narcotic agent status: Secondary | ICD-10-CM

## 2018-08-29 DIAGNOSIS — E785 Hyperlipidemia, unspecified: Secondary | ICD-10-CM | POA: Diagnosis present

## 2018-08-29 DIAGNOSIS — I2510 Atherosclerotic heart disease of native coronary artery without angina pectoris: Secondary | ICD-10-CM | POA: Diagnosis present

## 2018-08-29 DIAGNOSIS — Z79899 Other long term (current) drug therapy: Secondary | ICD-10-CM

## 2018-08-29 DIAGNOSIS — I480 Paroxysmal atrial fibrillation: Secondary | ICD-10-CM | POA: Diagnosis present

## 2018-08-29 DIAGNOSIS — E119 Type 2 diabetes mellitus without complications: Secondary | ICD-10-CM | POA: Diagnosis not present

## 2018-08-29 DIAGNOSIS — M19032 Primary osteoarthritis, left wrist: Secondary | ICD-10-CM | POA: Diagnosis present

## 2018-08-29 DIAGNOSIS — I4891 Unspecified atrial fibrillation: Secondary | ICD-10-CM | POA: Diagnosis not present

## 2018-08-29 DIAGNOSIS — Z1159 Encounter for screening for other viral diseases: Secondary | ICD-10-CM | POA: Diagnosis not present

## 2018-08-29 DIAGNOSIS — Z6841 Body Mass Index (BMI) 40.0 and over, adult: Secondary | ICD-10-CM | POA: Diagnosis not present

## 2018-08-29 DIAGNOSIS — E1141 Type 2 diabetes mellitus with diabetic mononeuropathy: Secondary | ICD-10-CM | POA: Diagnosis present

## 2018-08-29 DIAGNOSIS — I441 Atrioventricular block, second degree: Secondary | ICD-10-CM | POA: Diagnosis present

## 2018-08-29 DIAGNOSIS — M109 Gout, unspecified: Secondary | ICD-10-CM | POA: Diagnosis present

## 2018-08-29 DIAGNOSIS — R55 Syncope and collapse: Secondary | ICD-10-CM | POA: Diagnosis not present

## 2018-08-29 DIAGNOSIS — R296 Repeated falls: Secondary | ICD-10-CM | POA: Diagnosis present

## 2018-08-29 DIAGNOSIS — E1122 Type 2 diabetes mellitus with diabetic chronic kidney disease: Secondary | ICD-10-CM | POA: Diagnosis present

## 2018-08-29 DIAGNOSIS — I5032 Chronic diastolic (congestive) heart failure: Secondary | ICD-10-CM | POA: Diagnosis present

## 2018-08-29 DIAGNOSIS — F329 Major depressive disorder, single episode, unspecified: Secondary | ICD-10-CM | POA: Diagnosis present

## 2018-08-29 DIAGNOSIS — E782 Mixed hyperlipidemia: Secondary | ICD-10-CM | POA: Diagnosis not present

## 2018-08-29 DIAGNOSIS — R001 Bradycardia, unspecified: Secondary | ICD-10-CM | POA: Diagnosis present

## 2018-08-29 DIAGNOSIS — Z9842 Cataract extraction status, left eye: Secondary | ICD-10-CM

## 2018-08-29 DIAGNOSIS — G4733 Obstructive sleep apnea (adult) (pediatric): Secondary | ICD-10-CM | POA: Diagnosis present

## 2018-08-29 DIAGNOSIS — E876 Hypokalemia: Secondary | ICD-10-CM | POA: Diagnosis present

## 2018-08-29 DIAGNOSIS — E875 Hyperkalemia: Secondary | ICD-10-CM | POA: Diagnosis present

## 2018-08-29 DIAGNOSIS — Z959 Presence of cardiac and vascular implant and graft, unspecified: Secondary | ICD-10-CM | POA: Diagnosis not present

## 2018-08-29 DIAGNOSIS — Z9841 Cataract extraction status, right eye: Secondary | ICD-10-CM

## 2018-08-29 DIAGNOSIS — Z7982 Long term (current) use of aspirin: Secondary | ICD-10-CM

## 2018-08-29 DIAGNOSIS — I4892 Unspecified atrial flutter: Secondary | ICD-10-CM | POA: Diagnosis not present

## 2018-08-29 DIAGNOSIS — I442 Atrioventricular block, complete: Secondary | ICD-10-CM | POA: Diagnosis not present

## 2018-08-29 DIAGNOSIS — I4821 Permanent atrial fibrillation: Secondary | ICD-10-CM | POA: Diagnosis present

## 2018-08-29 DIAGNOSIS — M170 Bilateral primary osteoarthritis of knee: Secondary | ICD-10-CM | POA: Diagnosis present

## 2018-08-29 DIAGNOSIS — N183 Chronic kidney disease, stage 3 (moderate): Secondary | ICD-10-CM | POA: Diagnosis present

## 2018-08-29 DIAGNOSIS — N184 Chronic kidney disease, stage 4 (severe): Secondary | ICD-10-CM | POA: Diagnosis present

## 2018-08-29 DIAGNOSIS — M19012 Primary osteoarthritis, left shoulder: Secondary | ICD-10-CM | POA: Diagnosis present

## 2018-08-29 DIAGNOSIS — G8929 Other chronic pain: Secondary | ICD-10-CM | POA: Diagnosis present

## 2018-08-29 DIAGNOSIS — K219 Gastro-esophageal reflux disease without esophagitis: Secondary | ICD-10-CM | POA: Diagnosis present

## 2018-08-29 DIAGNOSIS — Z9049 Acquired absence of other specified parts of digestive tract: Secondary | ICD-10-CM

## 2018-08-29 DIAGNOSIS — M545 Low back pain: Secondary | ICD-10-CM | POA: Diagnosis present

## 2018-08-29 DIAGNOSIS — IMO0001 Reserved for inherently not codable concepts without codable children: Secondary | ICD-10-CM

## 2018-08-29 DIAGNOSIS — Z86718 Personal history of other venous thrombosis and embolism: Secondary | ICD-10-CM | POA: Diagnosis not present

## 2018-08-29 DIAGNOSIS — I484 Atypical atrial flutter: Secondary | ICD-10-CM | POA: Diagnosis present

## 2018-08-29 DIAGNOSIS — Z87891 Personal history of nicotine dependence: Secondary | ICD-10-CM

## 2018-08-29 DIAGNOSIS — I452 Bifascicular block: Secondary | ICD-10-CM | POA: Diagnosis present

## 2018-08-29 DIAGNOSIS — Z79890 Hormone replacement therapy (postmenopausal): Secondary | ICD-10-CM

## 2018-08-29 DIAGNOSIS — M542 Cervicalgia: Secondary | ICD-10-CM

## 2018-08-29 DIAGNOSIS — I272 Pulmonary hypertension, unspecified: Secondary | ICD-10-CM | POA: Diagnosis present

## 2018-08-29 DIAGNOSIS — Z79891 Long term (current) use of opiate analgesic: Secondary | ICD-10-CM

## 2018-08-29 DIAGNOSIS — F419 Anxiety disorder, unspecified: Secondary | ICD-10-CM | POA: Diagnosis present

## 2018-08-29 DIAGNOSIS — M1611 Unilateral primary osteoarthritis, right hip: Secondary | ICD-10-CM | POA: Diagnosis present

## 2018-08-29 DIAGNOSIS — Z95828 Presence of other vascular implants and grafts: Secondary | ICD-10-CM

## 2018-08-29 LAB — CBC WITH DIFFERENTIAL/PLATELET
Abs Immature Granulocytes: 0.03 10*3/uL (ref 0.00–0.07)
Basophils Absolute: 0 10*3/uL (ref 0.0–0.1)
Basophils Relative: 0 %
Eosinophils Absolute: 0.1 10*3/uL (ref 0.0–0.5)
Eosinophils Relative: 2 %
HCT: 34 % — ABNORMAL LOW (ref 36.0–46.0)
Hemoglobin: 10.7 g/dL — ABNORMAL LOW (ref 12.0–15.0)
Immature Granulocytes: 0 %
Lymphocytes Relative: 8 %
Lymphs Abs: 0.6 10*3/uL — ABNORMAL LOW (ref 0.7–4.0)
MCH: 31.9 pg (ref 26.0–34.0)
MCHC: 31.5 g/dL (ref 30.0–36.0)
MCV: 101.5 fL — ABNORMAL HIGH (ref 80.0–100.0)
Monocytes Absolute: 0.4 10*3/uL (ref 0.1–1.0)
Monocytes Relative: 6 %
Neutro Abs: 6.3 10*3/uL (ref 1.7–7.7)
Neutrophils Relative %: 84 %
Platelets: 140 10*3/uL — ABNORMAL LOW (ref 150–400)
RBC: 3.35 MIL/uL — ABNORMAL LOW (ref 3.87–5.11)
RDW: 14.9 % (ref 11.5–15.5)
WBC: 7.5 10*3/uL (ref 4.0–10.5)
nRBC: 0 % (ref 0.0–0.2)

## 2018-08-29 LAB — COMPREHENSIVE METABOLIC PANEL
ALT: 27 U/L (ref 0–44)
AST: 39 U/L (ref 15–41)
Albumin: 3.9 g/dL (ref 3.5–5.0)
Alkaline Phosphatase: 55 U/L (ref 38–126)
Anion gap: 10 (ref 5–15)
BUN: 64 mg/dL — ABNORMAL HIGH (ref 8–23)
CO2: 22 mmol/L (ref 22–32)
Calcium: 9.1 mg/dL (ref 8.9–10.3)
Chloride: 103 mmol/L (ref 98–111)
Creatinine, Ser: 2.24 mg/dL — ABNORMAL HIGH (ref 0.44–1.00)
GFR calc Af Amer: 26 mL/min — ABNORMAL LOW (ref >60)
GFR calc non Af Amer: 23 mL/min — ABNORMAL LOW (ref >60)
Glucose, Bld: 174 mg/dL — ABNORMAL HIGH (ref 70–99)
Potassium: 5.6 mmol/L — ABNORMAL HIGH (ref 3.5–5.1)
Sodium: 135 mmol/L (ref 135–145)
Total Bilirubin: 0.9 mg/dL (ref 0.3–1.2)
Total Protein: 6.3 g/dL — ABNORMAL LOW (ref 6.5–8.1)

## 2018-08-29 LAB — URINALYSIS, ROUTINE W REFLEX MICROSCOPIC
Bilirubin Urine: NEGATIVE
Glucose, UA: NEGATIVE mg/dL
Ketones, ur: NEGATIVE mg/dL
Nitrite: NEGATIVE
Protein, ur: 30 mg/dL — AB
Specific Gravity, Urine: 1.008 (ref 1.005–1.030)
pH: 7 (ref 5.0–8.0)

## 2018-08-29 LAB — TSH: TSH: 1.773 u[IU]/mL (ref 0.350–4.500)

## 2018-08-29 LAB — RAPID URINE DRUG SCREEN, HOSP PERFORMED
Amphetamines: NOT DETECTED
Barbiturates: NOT DETECTED
Benzodiazepines: NOT DETECTED
Cocaine: NOT DETECTED
Opiates: NOT DETECTED
Tetrahydrocannabinol: NOT DETECTED

## 2018-08-29 LAB — PROTIME-INR
INR: 2.7 — ABNORMAL HIGH (ref 0.8–1.2)
Prothrombin Time: 28.6 seconds — ABNORMAL HIGH (ref 11.4–15.2)

## 2018-08-29 LAB — T4, FREE: Free T4: 1.42 ng/dL (ref 0.82–1.77)

## 2018-08-29 LAB — CBG MONITORING, ED: Glucose-Capillary: 167 mg/dL — ABNORMAL HIGH (ref 70–99)

## 2018-08-29 LAB — GLUCOSE, CAPILLARY
Glucose-Capillary: 138 mg/dL — ABNORMAL HIGH (ref 70–99)
Glucose-Capillary: 179 mg/dL — ABNORMAL HIGH (ref 70–99)

## 2018-08-29 LAB — SARS CORONAVIRUS 2 BY RT PCR (HOSPITAL ORDER, PERFORMED IN ~~LOC~~ HOSPITAL LAB): SARS Coronavirus 2: NEGATIVE

## 2018-08-29 MED ORDER — ISOSORBIDE MONONITRATE ER 60 MG PO TB24
120.0000 mg | ORAL_TABLET | Freq: Every day | ORAL | Status: DC
  Administered 2018-08-30 – 2018-09-03 (×5): 120 mg via ORAL
  Filled 2018-08-29 (×5): qty 2

## 2018-08-29 MED ORDER — LEVOTHYROXINE SODIUM 50 MCG PO TABS
50.0000 ug | ORAL_TABLET | Freq: Every day | ORAL | Status: DC
  Administered 2018-08-30 – 2018-09-03 (×5): 50 ug via ORAL
  Filled 2018-08-29 (×5): qty 1

## 2018-08-29 MED ORDER — ACETAMINOPHEN 325 MG PO TABS
650.0000 mg | ORAL_TABLET | Freq: Four times a day (QID) | ORAL | Status: DC | PRN
  Administered 2018-08-30 (×2): 650 mg via ORAL
  Filled 2018-08-29 (×2): qty 2

## 2018-08-29 MED ORDER — HYDRALAZINE HCL 20 MG/ML IJ SOLN: 5.0000 mg | INTRAMUSCULAR | Status: DC | PRN

## 2018-08-29 MED ORDER — SODIUM CHLORIDE 0.9% FLUSH
3.0000 mL | Freq: Two times a day (BID) | INTRAVENOUS | Status: DC
  Administered 2018-08-29 – 2018-08-30 (×3): 3 mL via INTRAVENOUS

## 2018-08-29 MED ORDER — INSULIN ASPART 100 UNIT/ML ~~LOC~~ SOLN
0.0000 [IU] | Freq: Three times a day (TID) | SUBCUTANEOUS | Status: DC
  Administered 2018-08-30 – 2018-09-02 (×5): 1 [IU] via SUBCUTANEOUS

## 2018-08-29 MED ORDER — LACTATED RINGERS IV SOLN
INTRAVENOUS | Status: DC
  Administered 2018-08-29 – 2018-08-30 (×2): via INTRAVENOUS

## 2018-08-29 MED ORDER — OXYCODONE-ACETAMINOPHEN 5-325 MG PO TABS
1.0000 | ORAL_TABLET | Freq: Four times a day (QID) | ORAL | Status: DC | PRN
  Administered 2018-08-29 – 2018-08-31 (×3): 1 via ORAL
  Administered 2018-08-31 – 2018-09-01 (×2): 2 via ORAL
  Administered 2018-09-01: 1 via ORAL
  Administered 2018-09-02: 2 via ORAL
  Administered 2018-09-02: 1 via ORAL
  Administered 2018-09-02: 2 via ORAL
  Administered 2018-09-03: 1 via ORAL
  Filled 2018-08-29: qty 1
  Filled 2018-08-29: qty 2
  Filled 2018-08-29 (×2): qty 1
  Filled 2018-08-29: qty 2
  Filled 2018-08-29 (×2): qty 1
  Filled 2018-08-29 (×2): qty 2
  Filled 2018-08-29: qty 1

## 2018-08-29 MED ORDER — INSULIN ASPART 100 UNIT/ML ~~LOC~~ SOLN: 0.0000 [IU] | Freq: Every day | SUBCUTANEOUS | Status: DC

## 2018-08-29 MED ORDER — ALLOPURINOL 300 MG PO TABS
300.0000 mg | ORAL_TABLET | Freq: Every day | ORAL | Status: DC
  Administered 2018-08-30 – 2018-09-03 (×5): 300 mg via ORAL
  Filled 2018-08-29 (×5): qty 1

## 2018-08-29 MED ORDER — ACETAMINOPHEN 650 MG RE SUPP: 650.0000 mg | Freq: Four times a day (QID) | RECTAL | Status: DC | PRN

## 2018-08-29 MED ORDER — CITALOPRAM HYDROBROMIDE 20 MG PO TABS
20.0000 mg | ORAL_TABLET | Freq: Every day | ORAL | Status: DC
  Administered 2018-08-29 – 2018-09-02 (×5): 20 mg via ORAL
  Filled 2018-08-29 (×3): qty 1
  Filled 2018-08-29: qty 2
  Filled 2018-08-29 (×2): qty 1

## 2018-08-29 MED ORDER — ONDANSETRON HCL 4 MG PO TABS: 4.0000 mg | ORAL_TABLET | Freq: Four times a day (QID) | ORAL | Status: DC | PRN

## 2018-08-29 MED ORDER — SODIUM CHLORIDE 0.9 % IV BOLUS
500.0000 mL | Freq: Once | INTRAVENOUS | Status: AC
  Administered 2018-08-29: 500 mL via INTRAVENOUS

## 2018-08-29 MED ORDER — MELATONIN 3 MG PO TABS
6.0000 mg | ORAL_TABLET | Freq: Every day | ORAL | Status: DC
  Administered 2018-08-29 – 2018-09-02 (×5): 6 mg via ORAL
  Filled 2018-08-29 (×7): qty 2

## 2018-08-29 MED ORDER — ONDANSETRON HCL 4 MG/2ML IJ SOLN
4.0000 mg | Freq: Four times a day (QID) | INTRAMUSCULAR | Status: DC | PRN
  Administered 2018-08-30: 4 mg via INTRAVENOUS
  Filled 2018-08-29: qty 2

## 2018-08-29 MED ORDER — INSULIN DETEMIR 100 UNIT/ML ~~LOC~~ SOLN
24.0000 [IU] | Freq: Every day | SUBCUTANEOUS | Status: DC
  Filled 2018-08-29: qty 0.24

## 2018-08-29 MED ORDER — ROSUVASTATIN CALCIUM 5 MG PO TABS
10.0000 mg | ORAL_TABLET | Freq: Every day | ORAL | Status: DC
  Filled 2018-08-29: qty 2

## 2018-08-29 NOTE — ED Provider Notes (Addendum)
[facility] EMERGENCY DEPARTMENT Provider Note   CSN: [id number] Arrival date & time: 08/29/18  1114    History   Chief Complaint Chief Complaint  Patient presents with   Seizures   Loss of Consciousness    HPI [name] is a 63 y.o. female presenting for evaluation of syncope.  Patient states today she felt dizzy, and then had a syncopal event.  She think she hit her head, she had a sore spot.  Patient states she had 2 more syncopal events.  One was with EMS, was described as being an episode where the patient went stiff and stared off to 1 side for a few seconds.  She then had several seconds of a postictal like period, which resolved rapidly.  Patient states she had a seizure several months ago, and is monitoring for any future seizures.  But she has not had any since.  She is not currently on any seizure medications.  Patient reports a history of A. fib on warfarin.  She used to be followed by Dr. Einar Gip with cardiology, but has not seen him for 2 years and is looking for a new cardiologist.  Patient reports some increased shortness of breath over the past few days, but denies fevers, chills, cough, chest pain, nausea, vomiting, abdominal pain, urinary symptoms, abnormal bowel movements. Patient was started on spironolactone recently, no other recent medication changes.  Additional history obtained from chart review.  Patient with a history of diabetes, morbid obesity, sleep apnea, hypertension, hyperlipidemia, history of PE, pulmonary hypertension, left bundle branch block, chronic renal insufficiency, CHF, COPD.  Patient states she has a pinched nerve in her neck, causing lot of neck/shoulder pain that radiates down her left arm.      HPI  Past Medical History:  Diagnosis Date   A-fib ([place])    Anemia    Anxiety    Arthritis    "right hip; both knees; left wrist/shoulder; back" (01/19/2013"   Bleeding on Coumadin 08/2012; 01/18/2013   BRBPR  admissions (01/19/2013)   CHF (congestive heart failure) ([place])    "2-3 times" (01/19/2013)   Chronic lower back pain    Depression    DVT (deep venous thrombosis) (HCC) 10 years ago   numerous/notes 01/18/2013   GERD (gastroesophageal reflux disease)    Gout    Headache(784.0)    "maybe weekly" (01/19/2013)   Heart murmur    High cholesterol    "been off RX for this at one time" (01/18/2013)   History of blood transfusion 1983; 04/2012   "3 w/ childbirth; hospitalized for pain" (01/19/2013)   Hypertension    Hypothyroidism    Migraines    "twice/yr maybe" (01/19/2013)   OSA (obstructive sleep apnea)    "sent me for test in 04/2012; never ordered mask, etc" (01/19/2013)   PE (pulmonary thromboembolism) ([place]) 3 years ag0   3/notes 01/18/2013   Pneumonia before 2011   "once' (01/18/2013)   Renal disorder    kindey function low; "Metformin was destroying my kidneys" (01/19/2013)   Shortness of breath    "only related to my CHF" (01/18/2013)   Swelling of hand 08/31/2014   RT HAND   Type II diabetes mellitus ([place])    UTI (urinary tract infection) 08/31/2014    Patient Active Problem List   Diagnosis Date Noted   Syncope 08/29/2018   Influenza    Influenza-like illness    Acute on chronic diastolic congestive heart failure ([place])    Atypical  chest pain    Hypoxemia 05/04/2016   Hypoxia 05/04/2016   CHF (congestive heart failure) ([place]) 09/12/2015   COPD exacerbation ([place]) 09/12/2015   Personal history of endocrine, metabolic or immunity disorder 04/03/2015   Decreased mobility 04/03/2015   DVT (deep venous thrombosis), bilateral 02/24/2015   CKD (chronic kidney disease) stage 3, GFR 30-59 ml/min (HCC) 02/24/2015   Long-term (current) use of anticoagulants 11/19/2014   Osteoarthritis 08/31/2014   Cellulitis of hand, right 08/31/2014   Tremors  05/07/2012   Hypoglycemia 05/07/2012   Abdominal pain 05/04/2012   Sleep apnea, has not  started titration  05/03/2012   UTI (lower urinary tract infection) 05/03/2012   UTI (urinary tract infection) 05/03/2012   DVT, bil in 08/2011 09/03/2011   Anticoagulated on Coumadin, chronically 09/03/2011   Thrombocytopenia, this admission, 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 08/13/2011   History of pulmonary embolism, recurrent, 3 seperate episodes. 08/13/2011   Presence of IVC filter, placed June 2011 after 3d PE 08/13/2011   Pulmonary HTN, Moderate  PA pressure 47mmHG 09/2009 08/13/2011   Sinus bradycardia 08/13/2011    Past Surgical History:  Procedure Laterality Date   CARDIAC CATHETERIZATION N/A 03/13/2015   Procedure: Right/Left Heart Cath and Coronary Angiography;  Surgeon: Adrian Prows, MD;  Location: [facility];  Service: Cardiovascular;  Laterality: N/A;   CATARACT EXTRACTION W/ INTRAOCULAR LENS  IMPLANT, BILATERAL Bilateral 2006-2011   CESAREAN SECTION  1983   CHOLECYSTECTOMY  ~ 2002   COLONOSCOPY N/A 01/21/2013   Procedure: COLONOSCOPY;  Surgeon: Beryle Beams, MD;  Location: [facility];  Service: Endoscopy;  Laterality: N/A;   EYE SURGERY Bilateral    "multiple" (01/18/2013)   PARS PLANA REPAIR OF RETINAL DEATACHMENT Right    PARS PLANA VITRECTOMY Bilateral 2004-2006   "several" (01/18/2013)   REFRACTIVE SURGERY Bilateral    "for stigmatism" (01/18/2013)   REFRACTIVE SURGERY Left ~ 11/2012   "to puff it up cause  vision got hazy" (01/18/2013)   [place]  2011?     OB History   No obstetric history on file.      Home Medications    Prior to Admission medications   Medication Sig Start Date End Date Taking? Authorizing Provider  acetaminophen (TYLENOL) 500 MG tablet Take 500 mg by mouth every 6 (six) hours as needed for fever or headache (pain).    [provider]  allopurinol (ZYLOPRIM) 300 MG tablet Take 300 mg by mouth daily.    [provider]  aspirin EC 81 MG tablet Take 81 mg by mouth daily.    [provider]  Biotin 1000 MCG tablet Take 1,000 mcg by mouth See admin instructions. Take 1 tablet (1000 mcg) by mouth every afternoon and evening    [provider]  CALCIUM CITRATE PO Take 600 mg by mouth See admin instructions. Take 1 tablet (600 mg) by mouth every afternoon and evening    [provider]  citalopram (CELEXA) 20 MG tablet Take 20 mg by mouth at bedtime.    [provider]  diazepam (VALIUM) 5 MG tablet Take 5 mg by mouth at bedtime as needed (back spasms).    [provider]  hydrALAZINE (APRESOLINE) 25 MG tablet Take 25 mg by mouth 3 (three) times daily.    [provider]  HYDROcodone-acetaminophen (NORCO/VICODIN) 5-325 MG tablet Take 1 tablet by mouth every 6 (six) hours as needed for up to 15 doses. 06/19/18   Curatolo, Adam, DO  insulin aspart (NOVOLOG) 100 UNIT/ML injection Inject 2 Units into the skin 3 (three) times daily with meals. Patient taking differently: Inject 6-10 Units into the skin 3 (three) times daily with meals.  09/04/14   Velvet Bathe, MD  Insulin Detemir (LEVEMIR FLEXTOUCH) 100 UNIT/ML Pen Inject 24 Units into the skin daily before breakfast.    [provider]  isosorbide mononitrate (IMDUR) 120 MG 24 hr tablet Take 120 mg by mouth daily.    [provider]  levothyroxine (SYNTHROID, LEVOTHROID) 50 MCG tablet Take 50 mcg by mouth daily before  breakfast.     [provider]  Melatonin 5 MG TABS Take 5 mg by mouth at bedtime.    [provider]  Multiple Vitamin (MULITIVITAMIN WITH MINERALS) TABS Take 1 tablet by mouth daily.    [provider]  Omega-3 Fatty Acids (FISH OIL) 1000 MG CAPS Take 1,000 mg by mouth See admin instructions. Take 1 capsule (1000 mg) by mouth every afternoon and evening    [provider]  oxyCODONE-acetaminophen (PERCOCET/ROXICET) 5-325 MG tablet Take 1-2 tablets by mouth every 6 (six) hours as needed for moderate pain or severe pain. 06/11/18   Duffy Bruce, MD  rosuvastatin (CRESTOR) 10 MG tablet Take 1 tablet (10 mg total) by mouth daily. 05/09/16   Rogue Bussing, MD  torsemide (DEMADEX) 20 MG tablet Take 2 tablets (40 mg total) by mouth daily. 05/09/16   Rogue Bussing, MD  warfarin (COUMADIN) 2.5 MG tablet TAKE 7.5 MG ON M-W-F AND 5 MG TUES, THURS, SAT AND SUN AS DIRECTED Patient taking differently: TAKE 7.5 MG ON MON, WED, FRI, SAT AND TAKE 5 MG SUN, TUE, THU - AT 4PM - OR AS DIRECTED 02/10/16   Gordy Levan, MD    Family History Family History  Problem Relation Age of Onset   Cerebral aneurysm Mother    Hypertension Father    Cerebral aneurysm Maternal Grandfather    Cerebral aneurysm Maternal Aunt    Cancer Maternal Uncle     Social History Social History   Tobacco Use   Smoking status: Former Smoker    Packs/day: 0.05    Years: 30.00    Pack years: 1.50    Types: Cigarettes    Last attempt to quit: [place]    Years since quitting: 30.4   Smokeless tobacco: Never Used   Tobacco comment: 01/19/2013 "quit smoking cigarettes in the early '90's"  Substance Use Topics   Alcohol use: Not Currently    Comment: rare now but never a heavy drinker   Drug use: No     Allergies   Morphine and related   Review of Systems Review of Systems  Neurological: Positive for dizziness and syncope.  All other systems reviewed and are  negative.    Physical Exam Updated Vital Signs BP (!) 147/46    Pulse (!) 38    Temp 98.3 F (36.8 C) (Oral)    Resp  12    Ht 5\' 2"  (1.575 m)    Wt 114.3 kg    SpO2 99%    BMI 46.09 kg/m   Physical Exam Vitals signs and nursing note reviewed.  Constitutional:      General: She is not in acute distress.    Appearance: She is well-developed.     Comments: Morbidly obese female who appears nontoxic  HENT:     Head: Normocephalic and atraumatic.  Eyes:     Extraocular Movements: Extraocular movements intact.     Conjunctiva/sclera: Conjunctivae normal.     Pupils: Pupils are equal, round, and reactive to light.     Comments: EOMI and PERRLA.  Neck:     Musculoskeletal: Normal range of motion and neck supple.     Comments: Tenderness/pain with movement of her head and her left shoulder, chronic.  No new changes per patient Cardiovascular:     Rate and Rhythm: Bradycardia present. Rhythm irregular.     Comments: Heart rate between 30 and 45 on my exam.  Irregular. Pulmonary:     Effort: Pulmonary effort is normal. No respiratory distress.     Breath sounds: Normal breath sounds. No wheezing.     Comments: Speaking in full sentences.  Clear lung sounds in all fields. Abdominal:     General: There is no distension.     Palpations: Abdomen is soft. There is no mass.     Tenderness: There is no abdominal tenderness. There is no guarding or rebound.  Musculoskeletal: Normal range of motion.     Comments: Small laceration to left pinky toe.  Pedal pulses intact bilaterally.  Good cap refill.  Strength of upper and lower extremities intact bilaterally.  Skin:    General: Skin is warm and dry.     Capillary Refill: Capillary refill takes less than 2 seconds.  Neurological:     Mental Status: She is alert and oriented to person, place, and time.      ED Treatments / Results  Labs (all labs ordered are listed, but only abnormal results are displayed) Labs Reviewed  CBC WITH  DIFFERENTIAL/PLATELET - Abnormal; Notable for the following components:      Result Value   RBC 3.35 (*)    Hemoglobin 10.7 (*)    HCT 34.0 (*)    MCV 101.5 (*)    Platelets 140 (*)    Lymphs Abs 0.6 (*)    All other components within normal limits  COMPREHENSIVE METABOLIC PANEL - Abnormal; Notable for the following components:   Potassium 5.6 (*)    Glucose, Bld 174 (*)    BUN 64 (*)    Creatinine, Ser 2.24 (*)    Total Protein 6.3 (*)    GFR calc non Af Amer 23 (*)    GFR calc Af Amer 26 (*)    All other components within normal limits  PROTIME-INR - Abnormal; Notable for the following components:   Prothrombin Time 28.6 (*)    INR 2.7 (*)    All other components within normal limits  CBG MONITORING, ED - Abnormal; Notable for the following components:   Glucose-Capillary 167 (*)    All other components within normal limits  SARS CORONAVIRUS 2 (HOSPITAL ORDER, [facility])  RAPID URINE DRUG SCREEN, HOSP PERFORMED  URINALYSIS, ROUTINE W REFLEX MICROSCOPIC  HIV ANTIBODY (ROUTINE TESTING W REFLEX)  TSH  T4, FREE    EKG EKG Interpretation  Date/Time:  Sunday Aug 29 2018  11:16:42 EDT Ventricular Rate:  59 PR Interval:    QRS Duration: 160 QT Interval:  501 QTC Calculation: 525 R Axis:   -78 Text Interpretation:  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 Multiplanar CT image reconstructions of the cervical spine were also generated. COMPARISON:  09/08/2012 FINDINGS: CT HEAD FINDINGS Brain: No evidence of acute infarction, hemorrhage, hydrocephalus, extra-axial  collection or mass lesion/mass effect. Mild periventricular white matter hypodensity. Vascular: No hyperdense vessel or unexpected calcification. Skull: Normal. Negative for fracture or focal lesion. Sinuses/Orbits: No acute finding. Other: None. CT CERVICAL SPINE FINDINGS Alignment: Normal. Skull base and vertebrae: There is a minimally displaced fracture of the left facet of C6 (series 11, image 33, series 9, image 58). There are additional comminuted fractures of the right facet and pedicle of C7, this level somewhat limited by motion artifact (series 12, image 60, series 11, image 34). No primary bone lesion or focal pathologic process. Soft tissues and spinal canal: No prevertebral fluid or swelling. No visible canal hematoma. Disc levels: There is severe multilevel disc and facet degenerative disease, worst from C3 through C7. Upper chest: Negative. Other: None. IMPRESSION: 1. No acute intracranial pathology. Small-vessel white matter disease. 2. There is a minimally displaced fracture of the left facet of C6 (series 11, image 33, series 9, image 58). There are additional comminuted fractures of the right facet and pedicle of C7, this level somewhat limited by motion artifact (series 12, image 60, series 11, image 34). 3.  No other fracture or static subluxation of the cervical spine. 4. Severe multilevel disc and facet degenerative disease of the cervical spine. Electronically Signed   By: Eddie Candle M.D.   On: 08/29/2018 15:03   Ct Cervical Spine Wo Contrast  Result Date: 08/29/2018 CLINICAL DATA:  Seizure, unwitnessed fall EXAM: CT HEAD WITHOUT CONTRAST CT CERVICAL SPINE WITHOUT CONTRAST TECHNIQUE: Multidetector CT imaging of the head and cervical spine was performed following the standard protocol without intravenous contrast. Multiplanar CT image reconstructions of the cervical spine were also generated. COMPARISON:  09/08/2012 FINDINGS: CT HEAD FINDINGS Brain: No evidence of acute infarction,  hemorrhage, hydrocephalus, extra-axial collection or mass lesion/mass effect. Mild periventricular white matter hypodensity. Vascular: No hyperdense vessel or unexpected calcification. Skull: Normal. Negative for fracture or focal lesion. Sinuses/Orbits: No acute finding. Other: None. CT CERVICAL SPINE FINDINGS Alignment: Normal. Skull base and vertebrae: There is a minimally displaced fracture of the left facet of C6 (series 11, image 33, series 9, image 58). There are additional comminuted fractures of the right facet and pedicle of C7, this level somewhat limited by motion artifact (series 12, image 60, series 11, image 34). No primary bone lesion or focal pathologic process. Soft tissues and spinal canal: No prevertebral fluid or swelling. No visible canal hematoma. Disc levels: There is severe multilevel disc and facet degenerative disease, worst from C3 through C7. Upper chest: Negative. Other: None. IMPRESSION: 1. No acute intracranial pathology. Small-vessel white matter disease. 2. There is a minimally displaced fracture of the left facet of C6 (series 11, image 33, series 9, image 58). There are additional comminuted fractures of the right facet and pedicle of  C7, this level somewhat limited by motion artifact (series 12, image 60, series 11, image 34). 3.  No other fracture or static subluxation of the cervical spine. 4. Severe multilevel disc and facet degenerative disease of the cervical spine. Electronically Signed   By: Eddie Candle M.D.   On: 08/29/2018 15:03    Procedures .Critical Care Performed by: Franchot Heidelberg, PA-C Authorized by: Franchot Heidelberg, PA-C   Critical care provider statement:    Critical care time (minutes):  40   Critical care time was exclusive of:  Separately billable procedures and treating other patients and teaching time   Critical care was necessary to treat or prevent imminent or life-threatening deterioration of the following conditions:  Circulatory  failure   Critical care was time spent personally by me on the following activities:  Blood draw for specimens, development of treatment plan with patient or surrogate, discussions with consultants, evaluation of patient's response to treatment, examination of patient, obtaining history from patient or surrogate, ordering and performing treatments and interventions, ordering and review of laboratory studies, ordering and review of radiographic studies, pulse oximetry, re-evaluation of patient's condition and review of old charts   I assumed direction of critical care for this patient from another provider in my specialty: no   Comments:     Pt with significant bradycardia in the 30's, concern for cardiogenic syncope. Cardiology consulted and pt admitted for further management   (including critical care time)  Medications Ordered in ED Medications  allopurinol (ZYLOPRIM) tablet 300 mg (has no administration in time range)  oxyCODONE-acetaminophen (PERCOCET/ROXICET) 5-325 MG per tablet 1-2 tablet (has no administration in time range)  isosorbide mononitrate (IMDUR) 24 hr tablet 120 mg (has no administration in time range)  rosuvastatin (CRESTOR) tablet 10 mg (has no administration in time range)  citalopram (CELEXA) tablet 20 mg (has no administration in time range)  Insulin Detemir (LEVEMIR) FlexPen 24 Units (has no administration in time range)  levothyroxine (SYNTHROID) tablet 50 mcg (has no administration in time range)  Melatonin TABS 5 mg (has no administration in time range)  sodium chloride flush (NS) 0.9 % injection 3 mL (has no administration in time range)  lactated ringers infusion (has no administration in time range)  acetaminophen (TYLENOL) tablet 650 mg (has no administration in time range)    Or  acetaminophen (TYLENOL) suppository 650 mg (has no administration in time range)  hydrALAZINE (APRESOLINE) injection 5 mg (has no administration in time range)  ondansetron (ZOFRAN)  tablet 4 mg (has no administration in time range)    Or  ondansetron (ZOFRAN) injection 4 mg (has no administration in time range)  sodium chloride 0.9 % bolus 500 mL (0 mLs Intravenous Stopped 08/29/18 1556)     Initial Impression / Assessment and Plan / ED Course  I have reviewed the triage vital signs and the nursing notes.  Pertinent labs & imaging results that were available during my care of the patient were reviewed by me and considered in my medical decision making (see chart for details).        Patient presenting for evaluation of 3 episodes of syncope.  Physical exam shows patient with a heart rate in the 30s.  Concern for cardiac causes for syncope.  Additionally, patient with left shoulder pain, this is baseline per patient.  However, she did hit her head during one episode of syncope, will obtain CT head and neck as she is on warfarin.  Also found to have multiple contusions and  abrasions, but states it is more dull.  She does have a superficial lack of her left pinky toe.  Will obtain labs, CT head and neck, EKG, and reassess.  EKG concerning for possible third-degree block.  Will consult cardiology.  Discussed with Dr. Sallyanne Kuster from cardiology, who feels patient is in a slow a flutter rhythym, but will consult and recommends admission to the hospitalist.  Labs show hyperkalemia 5.6, elevated creatinine at 2.2.  Hemoglobin stable at 10.  CT head and neck pending.  CT head negative for bleed.  Cervical spine CT shows displaced facet C6 fracture and comminuted fractures of the right facet and pedicle of C7.  Will place patient in a collar and consult neurosurgery. Case discussed with attending, Dr. Jeanell Sparrow evaluated the patient.  Discussed with Lorin Mercy [facility], patient to be admitted.  Discussed with Dr. Zada Finders from neurosurgery, who recommends rigid collar and repet imaging for f/u. Will hold on surgery due to multiple co morbidities.   Final Clinical  Impressions(s) / ED Diagnoses   Final diagnoses:  Bradycardia  Syncope, unspecified syncope type  Hyperkalemia  AKI (acute kidney injury) [facility])    ED Discharge Orders    None       Franchot Heidelberg, PA-C 08/29/18 1613    Franchot Heidelberg, PA-C 08/29/18 1617    Pattricia Boss, MD 08/31/18 1059    Pattricia Boss, MD 08/31/18 1101

## 2018-08-29 NOTE — H&P (Signed)
History and Physical    [name] MOQ:[id number] DOB: 02-12-1956 DOA: 08/29/2018  PCP: [name], MD Consultants:  None; planning to see a spine specialist Patient coming from:  Home - lives with boyfriend; NOK:  Boyfriend  Chief Complaint: Syncope  HPI: [name] is a 63 y.o. female with medical history significant of DM; DVT/PE (2014); OSA; hypothyroidism; HTN; HLD; chronic low back pain; CHF; and afib on Coumadin presenting with syncope.  She was fine this AM - got up at 5, let the dogs out, started cooking Sunday dinner.  She got a little SOB, thought maybe it was allergies.  She went to sweep and felt dizzy and next thing she knew she woke up in the floor.  She falls frequently because of joint issues.  She went outside and fell off the porch and lost consciousness.  When EMS arrived, she lost consciousness again, "had another seizure" again.  She had seizures a few months ago - would lay back in the bed and "wasn't there".  She didn't have any further episodes.  No tongue biting.  ED Course:  Bracycardia, hypokalemia, AKI - presenting after 3 syncopal episodes, unknown length of time.  With EMS, stiff with gaze and recovery within a minute or 2.  No issues in the ER.  Bradycardia, HR 30-45.  Cardiology thinks slow flutter and will consult.  Possibly medication-related.  Head and neck CT pending due to fall.  Recently started on spironolactone.  Review of Systems: As per HPI; otherwise review of systems reviewed and negative.   Ambulatory Status:  Ambulates with a cane outside the house  Past Medical History:  Diagnosis Date   A-fib ([place])    Anemia    Anxiety    Arthritis    "right hip; both knees; left wrist/shoulder; back" (01/19/2013"   Bleeding on Coumadin 08/2012; 01/18/2013   [facility] admissions (01/19/2013)   CHF (congestive heart failure) ([place])    "2-3 times" (01/19/2013)   Chronic lower back pain    Depression    DVT (deep venous thrombosis) (HCC) 10  years ago   numerous/notes 01/18/2013   GERD (gastroesophageal reflux disease)    Gout    Headache(784.0)    "maybe weekly" (01/19/2013)   Heart murmur    High cholesterol    "been off RX for this at one time" (01/18/2013)   History of blood transfusion 1983; 04/2012   "3 w/ childbirth; hospitalized for pain" (01/19/2013)   Hypertension    Hypothyroidism    Migraines    "twice/yr maybe" (01/19/2013)   OSA (obstructive sleep apnea)    "sent me for test in 04/2012; never ordered mask, etc" (01/19/2013)   PE (pulmonary thromboembolism) ([place]) 3 years ag0   3/notes 01/18/2013   Pneumonia before 2011   "once' (01/18/2013)   Renal disorder    kindey function low; "Metformin was destroying my kidneys" (01/19/2013)   Shortness of breath    "only related to my CHF" (01/18/2013)   Swelling of hand 08/31/2014   RT HAND   Type II diabetes mellitus ([place])    UTI (urinary tract infection) 08/31/2014    Past Surgical History:  Procedure Laterality Date   CARDIAC CATHETERIZATION N/A 03/13/2015   Procedure: Right/Left Heart Cath and Coronary Angiography;  Surgeon: Adrian Prows, MD;  Location: [facility];  Service: Cardiovascular;  Laterality: N/A;   CATARACT EXTRACTION W/ INTRAOCULAR LENS  IMPLANT, BILATERAL Bilateral 2006-2011   CESAREAN SECTION  1983   CHOLECYSTECTOMY  ~  2002   COLONOSCOPY N/A 01/21/2013   Procedure: COLONOSCOPY;  Surgeon: Beryle Beams, MD;  Location: [facility];  Service: Endoscopy;  Laterality: N/A;   EYE SURGERY Bilateral    "multiple" (01/18/2013)   PARS PLANA REPAIR OF RETINAL DEATACHMENT Right    PARS PLANA VITRECTOMY Bilateral 2004-2006   "several" (01/18/2013)   REFRACTIVE SURGERY Bilateral    "for stigmatism" (01/18/2013)   REFRACTIVE SURGERY Left ~ 11/2012   "to puff it up cause vision got hazy" (01/18/2013)   [place]  2011?    Social History   Socioeconomic History   Marital status: Single    Spouse  name: Not on file   Number of children: Not on file   Years of education: Not on file   Highest education level: Not on file  Occupational History   Occupation: disabled  Social Designer, fashion/clothing strain: Not on file   Food insecurity:    Worry: Not on file    Inability: Not on file   Transportation needs:    Medical: Not on file    Non-medical: Not on file  Tobacco Use   Smoking status: Former Smoker    Packs/day: 0.05    Years: 30.00    Pack years: 1.50    Types: Cigarettes    Last attempt to quit: 1990    Years since quitting: 30.4   Smokeless tobacco: Never Used   Tobacco comment: 01/19/2013 "quit smoking cigarettes in the early '90's"  Substance and Sexual Activity   Alcohol use: Not Currently    Comment: rare now but never a heavy drinker   Drug use: No   Sexual activity: Never  Lifestyle   Physical activity:    Days per week: Not on file    Minutes per session: Not on file   Stress: Not on file  Relationships   Social connections:    Talks on phone: Not on file    Gets together: Not on file    Attends religious service: Not on file    Active member of club or organization: Not on file    Attends meetings of clubs or organizations: Not on file    Relationship status: Not on file   Intimate partner violence:    Fear of current or ex partner: Not on file    Emotionally abused: Not on file    Physically abused: Not on file    Forced sexual activity: Not on file  Other Topics Concern   Not on file  Social History Narrative   Not on file    Allergies  Allergen Reactions   Morphine And Related Rash    Family History  Problem Relation Age of Onset   Cerebral aneurysm Mother    Hypertension Father    Cerebral aneurysm Maternal Grandfather    Cerebral aneurysm Maternal Aunt    Cancer Maternal Uncle     Prior to Admission medications   Medication Sig Start Date End Date Taking? Authorizing Provider  acetaminophen  (TYLENOL) 500 MG tablet Take 500 mg by mouth every 6 (six) hours as needed for fever or headache (pain).    [provider]  allopurinol (ZYLOPRIM) 300 MG tablet Take 300 mg by mouth daily.    [provider]  aspirin EC 81 MG tablet Take 81 mg by mouth daily.    [provider]  benzonatate (TESSALON) 100 MG capsule Take 1 capsule (100 mg total) by mouth 2 (two) times daily  as needed for cough. 05/08/16   Rogue Bussing, MD  Biotin 1000 MCG tablet Take 1,000 mcg by mouth See admin instructions. Take 1 tablet (1000 mcg) by mouth every afternoon and evening    [provider]  CALCIUM CITRATE PO Take 600 mg by mouth See admin instructions. Take 1 tablet (600 mg) by mouth every afternoon and evening    [provider]  citalopram (CELEXA) 20 MG tablet Take 20 mg by mouth at bedtime.    [provider]  diazepam (VALIUM) 5 MG tablet Take 5 mg by mouth at bedtime as needed (back spasms).    [provider]  enoxaparin (LOVENOX) 120 MG/0.8ML injection Inject 0.8 mLs (120 mg total) into the skin every 12 (twelve) hours. 05/08/16   Rogue Bussing, MD  Ferrous Sulfate Dried (SLOW RELEASE IRON) 45 MG TBCR Take 45 mg by mouth daily.    [provider]  hydrALAZINE (APRESOLINE) 25 MG tablet Take 25 mg by mouth 3 (three) times daily.    [provider]  HYDROcodone-acetaminophen (NORCO/VICODIN) 5-325 MG tablet Take 1 tablet by mouth every 6 (six) hours as needed for up to 15 doses. 06/19/18   Curatolo, Adam, DO  insulin aspart (NOVOLOG) 100 UNIT/ML injection Inject 2 Units into the skin 3 (three) times daily with meals. Patient taking differently: Inject 6-10 Units into the skin 3 (three) times daily with meals.  09/04/14   Velvet Bathe, MD  Insulin Detemir (LEVEMIR FLEXTOUCH) 100 UNIT/ML Pen Inject 24 Units into the skin daily before breakfast.    [provider]  isosorbide mononitrate (IMDUR) 120 MG 24 hr  tablet Take 120 mg by mouth daily.    [provider]  levothyroxine (SYNTHROID, LEVOTHROID) 50 MCG tablet Take 50 mcg by mouth daily before breakfast.     [provider]  Melatonin 5 MG TABS Take 5 mg by mouth at bedtime.    [provider]  methylPREDNISolone (MEDROL DOSEPAK) 4 MG TBPK tablet Follow package instructions 06/19/18   Lennice Sites, DO  Multiple Vitamin (MULITIVITAMIN WITH MINERALS) TABS Take 1 tablet by mouth daily.    [provider]  Omega-3 Fatty Acids (FISH OIL) 1000 MG CAPS Take 1,000 mg by mouth See admin instructions. Take 1 capsule (1000 mg) by mouth every afternoon and evening    [provider]  oseltamivir (TAMIFLU) 30 MG capsule Take 1 capsule (30 mg total) by mouth 2 (two) times daily. 05/08/16   Rogue Bussing, MD  oseltamivir (TAMIFLU) 75 MG capsule Take 1 capsule (75 mg total) by mouth every 12 (twelve) hours. 05/04/16   Tanna Furry, MD  oxyCODONE-acetaminophen (PERCOCET/ROXICET) 5-325 MG tablet Take 1-2 tablets by mouth every 6 (six) hours as needed for moderate pain or severe pain. 06/11/18   Duffy Bruce, MD  rosuvastatin (CRESTOR) 10 MG tablet Take 1 tablet (10 mg total) by mouth daily. 05/09/16   Rogue Bussing, MD  torsemide (DEMADEX) 20 MG tablet Take 2 tablets (40 mg total) by mouth daily. 05/09/16   Rogue Bussing, MD  warfarin (COUMADIN) 2.5 MG tablet TAKE 7.5 MG ON M-W-F AND 5 MG TUES, THURS, SAT AND SUN AS DIRECTED Patient taking differently: TAKE 7.5 MG ON MON, WED, FRI, SAT AND TAKE 5 MG SUN, TUE, THU - AT 4PM - OR AS DIRECTED 02/10/16   Gordy Levan, MD    Physical Exam: Vitals:   08/29/18 1600 08/29/18 1615 08/29/18 1630 08/29/18 1721  BP: (!) 166/47 Marland Kitchen)  160/50 (!) 146/49 (!) 152/55  Pulse: (!) 39 (!) 39 (!) 36 (!) 42  Resp: 12 15 18  (!) 24  Temp:    98.3 F (36.8 C)  TempSrc:    Oral  SpO2: 100% 100% 100% 100%  Weight:      Height:          General:  Appears calm  and comfortable and is NAD; she is quite conversant  Eyes:  PERRL, EOMI, normal lids, iris  ENT:  grossly normal hearing, lips, mmm; there is a small laceration on her right tongue, presumably from biting  Neck:  no LAD, masses or thyromegaly  Cardiovascular:  Irregularly irregular with marked persistent bradycardia; no m/r/g. No LE edema.   Respiratory:   CTA bilaterally with no wheezes/rales/rhonchi.  Normal respiratory effort.  Abdomen:  soft, NT, ND, NABS  Skin:  Scattered excoriations and abrasions from falls; chronic venous stasis with mild superficial LE ulcerations  Musculoskeletal:  grossly normal tone BUE/BLE, good ROM, no bony abnormality  Psychiatric:  grossly normal mood and affect, speech fluent and appropriate, AOx3, very conversant  Neurologic:  CN 2-12 grossly intact, moves all extremities in coordinated fashion    Radiological Exams on Admission: Ct Head Wo Contrast  Result Date: 08/29/2018 CLINICAL DATA:  Seizure, unwitnessed fall EXAM: CT HEAD WITHOUT CONTRAST CT CERVICAL SPINE WITHOUT CONTRAST TECHNIQUE: Multidetector CT imaging of the head and cervical spine was performed following the standard protocol without intravenous contrast. Multiplanar CT image reconstructions of the cervical spine were also generated. COMPARISON:  09/08/2012 FINDINGS: CT HEAD FINDINGS Brain: No evidence of acute infarction, hemorrhage, hydrocephalus, extra-axial collection or mass lesion/mass effect. Mild periventricular white matter hypodensity. Vascular: No hyperdense vessel or unexpected calcification. Skull: Normal. Negative for fracture or focal lesion. Sinuses/Orbits: No acute finding. Other: None. CT CERVICAL SPINE FINDINGS Alignment: Normal. Skull base and vertebrae: There is a minimally displaced fracture of the left facet of C6 (series 11, image 33, series 9, image 58). There are additional comminuted fractures of the right facet and pedicle of C7, this level somewhat limited by  motion artifact (series 12, image 60, series 11, image 34). No primary bone lesion or focal pathologic process. Soft tissues and spinal canal: No prevertebral fluid or swelling. No visible canal hematoma. Disc levels: There is severe multilevel disc and facet degenerative disease, worst from C3 through C7. Upper chest: Negative. Other: None. IMPRESSION: 1. No acute intracranial pathology. Small-vessel white matter disease. 2. There is a minimally displaced fracture of the left facet of C6 (series 11, image 33, series 9, image 58). There are additional comminuted fractures of the right facet and pedicle of C7, this level somewhat limited by motion artifact (series 12, image 60, series 11, image 34). 3.  No other fracture or static subluxation of the cervical spine. 4. Severe multilevel disc and facet degenerative disease of the cervical spine. Electronically Signed   By: Eddie Candle M.D.   On: 08/29/2018 15:03   Ct Cervical Spine Wo Contrast  Result Date: 08/29/2018 CLINICAL DATA:  Seizure, unwitnessed fall EXAM: CT HEAD WITHOUT CONTRAST CT CERVICAL SPINE WITHOUT CONTRAST TECHNIQUE: Multidetector CT imaging of the head and cervical spine was performed following the standard protocol without intravenous contrast. Multiplanar CT image reconstructions of the cervical spine were also generated. COMPARISON:  09/08/2012 FINDINGS: CT HEAD FINDINGS Brain: No evidence of acute infarction, hemorrhage, hydrocephalus, extra-axial collection or mass lesion/mass effect. Mild periventricular white matter hypodensity. Vascular: No hyperdense vessel or unexpected calcification. Skull:  Normal. Negative for fracture or focal lesion. Sinuses/Orbits: No acute finding. Other: None. CT CERVICAL SPINE FINDINGS Alignment: Normal. Skull base and vertebrae: There is a minimally displaced fracture of the left facet of C6 (series 11, image 33, series 9, image 58). There are additional comminuted fractures of the right facet and pedicle of  C7, this level somewhat limited by motion artifact (series 12, image 60, series 11, image 34). No primary bone lesion or focal pathologic process. Soft tissues and spinal canal: No prevertebral fluid or swelling. No visible canal hematoma. Disc levels: There is severe multilevel disc and facet degenerative disease, worst from C3 through C7. Upper chest: Negative. Other: None. IMPRESSION: 1. No acute intracranial pathology. Small-vessel white matter disease. 2. There is a minimally displaced fracture of the left facet of C6 (series 11, image 33, series 9, image 58). There are additional comminuted fractures of the right facet and pedicle of C7, this level somewhat limited by motion artifact (series 12, image 60, series 11, image 34). 3.  No other fracture or static subluxation of the cervical spine. 4. Severe multilevel disc and facet degenerative disease of the cervical spine. Electronically Signed   By: Eddie Candle M.D.   On: 08/29/2018 15:03    EKG: Independently reviewed.  Atrial flutter with bradycardia vs. Complete heart block, rate 59; nonspecific ST changes with no evidence of acute ischemia   Labs on Admission: I have personally reviewed the available labs and imaging studies at the time of the admission.  Pertinent labs:   K+ 5.6 Glucose 174 BUN 64/Creatinine 2.24/GFR 23; 44/1.42/39 on 3/14 WBC 7.5 Hgb 10.7 Platelets 140 INR 2.7 COVID negative  Assessment/Plan Principal Problem:   Syncope Active Problems:   IDDM (insulin dependent diabetes mellitus) (HCC)   HTN (hypertension)   HLD (hyperlipidemia)   Diastolic dysfunction, grade 2, EF 65-70% May 2013   Chronic renal insufficiency, stage IV (severe) - due to DM & HTN, SCr improving-1.6   Anticoagulated on Coumadin, chronically   Symptomatic advanced heart block   Syncope -Patient with multiple episodes of apparent syncope today -Most likely etiology appears to be related to advanced heart block (see below) -However, seizure  is also a consideration given the description and tongue biting; will order EEG and seizure precautions, but hold neurology consult for now given likelihood that this cardiogenic syncope -By the [facility] syncope rule, the patient is at moderate/high risk for serious outcome and thus should be observed in the hospital. -Will monitor on telemetry in a Progressive Care Unit -Orthostatic vital signs now and in AM -Neuro checks  -check A1c, FLP, TSH  Symptomatic advanced heart block -Patient's EKG concerning for either aflutter with persistent bradycardia or complete heart block -She has been evaluated by Dr. Acie Fredrickson, who feels comfortable with patient on pacer pads on 6E or 2C overnight and plan for EP evaluation in AM  Cervical fracture -Minimally displaced fracture of C6 and comminuted fracture of C7 -Neurosurgery has been consulted and Dr. Zada Finders will see the patient  -She will be placed in a rigid C-collar and is likely a very poor operative candidate  Afib on Coumadin -Rate controlled (excessively, see above) -Hold Coumadin for now given possible need for PPM placement and/or C-spine surgery; will transition to Heparin  Stage 4 CKD -She was recently started on Lisinopril by her PCP but this may have actually exacerbated her renal dysfunction -At any rate, she has advanced renal disease and likely needs outpatient renal consultation and eventual fistula  placement -Will hold nephrotoxic agents including Lisinopril, diuretics including Spironolactone and Demadex  -Will gently hydrate and monitor BMP daily  Chronic diastolic CHF -09/64 echo with preserved EF; 6/17 echo with grade 3 diastolic dysfunction -Hold ACE, as above -No BB due to bradycardia -Hold diuretics -Will follow for evidence of volume overload; currently on LR at 75 cc/hr due to possible mild hypovolemia  DM -Check A1c - it was 5.0 in 1/18 -Continue Levemir -Will cover with sensitive-scale SSI  HTN -Will use  IV hydralazine for now  HLD -Continue Crestor  Hypothyroidism -Check TSH and free T4 -Continue Synthroid at current dose for now   Note: This patient has been tested and is negative for the novel coronavirus COVID-19.  DVT prophylaxis: Heparin drip Code Status:  Full - confirmed with patient Family Communication: None present Disposition Plan:  Home once clinically improved Consults called: Cardiology; neurosurgery; Wound care  Admission status: Admit - It is my clinical opinion that admission to INPATIENT is reasonable and necessary because of the expectation that this patient will require hospital care that crosses at least 2 midnights to treat this condition based on the medical complexity of the problems presented.  Given the aforementioned information, the predictability of an adverse outcome is felt to be significant.    Karmen Bongo MD [facility]   How to contact the [facility] Attending or Consulting provider [place] or covering provider during after hours [place], for this patient?  1. Check the care team in [facility] and look for a) attending/consulting [facility] provider listed and b) the [facility] team listed 2. Log into [email or web address] and use [place]'s universal password to access. If you do not have the password, please contact the hospital operator. 3. Locate the [facility] provider you are looking for under [facility] and page to a number that you can be directly reached. 4. If you still have difficulty reaching the provider, please page the [facility] (Director on Call) for the Hospitalists listed on amion for assistance.   08/29/2018, 5:27 PM

## 2018-08-29 NOTE — ED Triage Notes (Signed)
Pt BIB [facility] for seizure/loss of consciousness. Per EMS patient was found lying outside of her house at home. Per EMS patient reports she had a syncopal episode at home on her kitchen floor. Pt reports she felt dizzy and then woke up on the kitchen floor. Pt then went outside to get help and had another syncopal episode on her stairs outside. EMS reports patient had what appeared to be a seizure upon their arrival "pt stared off to one side and her body went stiff." Pt does have a past medical history of seizures. Pt complaining of neck pain, right shoulder pain, left knee and left foot pain. 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 Pt is alert and oriented x4 at present. Pt complaining of 10/10 left knee and foot pain. Pt also reporting 8/10 head/neck pain.

## 2018-08-29 NOTE — Consult Note (Signed)
Cardiology Consultation:   Patient ID: [name] MRN: [id number]; DOB: 1955/05/02  Admit date: 08/29/2018 Date of Consult: 08/29/2018  Primary Care Provider: [name], MD Primary Cardiologist: Ivin Rosenbloom  Primary Electrophysiologist:  None    Patient Profile:   [name] is a 63 y.o. female with a hx of  HTN, hyperlipidemia , chronic diastolic dysfunction, atrial flutter, bilateral DVT's / pulmonary embolism ( s/p IVC filter ) who is being seen today for the evaluation of syncope  at the request of  Dr. Jeanell Sparrow  History of Present Illness:   [name] is a 63 yo with hx of obesity, HTN, hyperlipidemia, DM, chronic diastolic dysfunction, atrial fib , DVT/ PE. She had a syncopal episode this am and was brought to the ER.   Reportedly had some seizure like activity .  She has a hx of seizures in the past several months ago.   Woke up,  Waked down the halll Felt a bit dizzy and then passed out , fell off the porch.  Was out for a period of time, called for her boyfriend,  Had seizure like actiity.   Watches the salt in her diet Walks in the store, Does not do yardwork due to leg pain , fatigue  Has had some shortness of breath recently Is on coumadin - managed by her primary   Past Medical History:  Diagnosis Date  . A-fib ([place])   . Anemia   . Anxiety   . Arthritis    "right hip; both knees; left wrist/shoulder; back" (01/19/2013"  . Bleeding on Coumadin 08/2012; 01/18/2013   [facility] admissions (01/19/2013)  . CHF (congestive heart failure) ([place])    "2-3 times" (01/19/2013)  . Chronic lower back pain   . Depression   . DVT (deep venous thrombosis) ([place]) 10 years ago   numerous/notes 01/18/2013  . GERD (gastroesophageal reflux disease)   . Gout   . GURKYHCW(237.6)    "maybe weekly" (01/19/2013)  . Heart murmur   . High cholesterol    "been off RX for this at one time" (01/18/2013)  . History of blood transfusion 1983; 04/2012   "3 w/ childbirth; hospitalized  for pain" (01/19/2013)  . Hypertension   . Hypothyroidism   . Migraines    "twice/yr maybe" (01/19/2013)  . OSA (obstructive sleep apnea)    "sent me for test in 04/2012; never ordered mask, etc" (01/19/2013)  . PE (pulmonary thromboembolism) ([place]) 3 years ag0   3/notes 01/18/2013  . Pneumonia before 2011   "once' (01/18/2013)  . Renal disorder    kindey function low; "Metformin was destroying my kidneys" (01/19/2013)  . Shortness of breath    "only related to my CHF" (01/18/2013)  . Swelling of hand 08/31/2014   RT HAND  . Type II diabetes mellitus ([place])   . UTI (urinary tract infection) 08/31/2014    Past Surgical History:  Procedure Laterality Date  . CARDIAC CATHETERIZATION N/A 03/13/2015   Procedure: Right/Left Heart Cath and Coronary Angiography;  Surgeon: Adrian Prows, MD;  Location: [facility];  Service: Cardiovascular;  Laterality: N/A;  . CATARACT EXTRACTION W/ INTRAOCULAR LENS  IMPLANT, BILATERAL Bilateral 2006-2011  . CESAREAN SECTION  1983  . CHOLECYSTECTOMY  ~ 2002  . COLONOSCOPY N/A 01/21/2013   Procedure: COLONOSCOPY;  Surgeon: Beryle Beams, MD;  Location: [facility];  Service: Endoscopy;  Laterality: N/A;  . EYE SURGERY Bilateral    "multiple" (01/18/2013)  . PARS PLANA REPAIR OF RETINAL DEATACHMENT Right   .  PARS PLANA VITRECTOMY Bilateral 2004-2006   "several" (01/18/2013)  . REFRACTIVE SURGERY Bilateral    "for stigmatism" (01/18/2013)  . REFRACTIVE SURGERY Left ~ 11/2012   "to puff it up cause vision got hazy" (01/18/2013)  . VENA CAVA FILTER PLACEMENT  2011?     Home Medications:  Prior to Admission medications   Medication Sig Start Date End Date Taking? Authorizing Provider  acetaminophen (TYLENOL) 500 MG tablet Take 500 mg by mouth every 6 (six) hours as needed for fever or headache (pain).    [provider]  allopurinol (ZYLOPRIM) 300 MG tablet Take 300 mg by mouth daily.    [provider]  aspirin EC 81 MG tablet Take  81 mg by mouth daily.    [provider]  benzonatate (TESSALON) 100 MG capsule Take 1 capsule (100 mg total) by mouth 2 (two) times daily as needed for cough. 05/08/16   Rogue Bussing, MD  Biotin 1000 MCG tablet Take 1,000 mcg by mouth See admin instructions. Take 1 tablet (1000 mcg) by mouth every afternoon and evening    [provider]  CALCIUM CITRATE PO Take 600 mg by mouth See admin instructions. Take 1 tablet (600 mg) by mouth every afternoon and evening    [provider]  citalopram (CELEXA) 20 MG tablet Take 20 mg by mouth at bedtime.    [provider]  diazepam (VALIUM) 5 MG tablet Take 5 mg by mouth at bedtime as needed (back spasms).    [provider]  enoxaparin (LOVENOX) 120 MG/0.8ML injection Inject 0.8 mLs (120 mg total) into the skin every 12 (twelve) hours. 05/08/16   Rogue Bussing, MD  Ferrous Sulfate Dried (SLOW RELEASE IRON) 45 MG TBCR Take 45 mg by mouth daily.    [provider]  hydrALAZINE (APRESOLINE) 25 MG tablet Take 25 mg by mouth 3 (three) times daily.    [provider]  HYDROcodone-acetaminophen (NORCO/VICODIN) 5-325 MG tablet Take 1 tablet by mouth every 6 (six) hours as needed for up to 15 doses. 06/19/18   Curatolo, Adam, DO  insulin aspart (NOVOLOG) 100 UNIT/ML injection Inject 2 Units into the skin 3 (three) times daily with meals. Patient taking differently: Inject 6-10 Units into the skin 3 (three) times daily with meals.  09/04/14   Velvet Bathe, MD  Insulin Detemir (LEVEMIR FLEXTOUCH) 100 UNIT/ML Pen Inject 24 Units into the skin daily before breakfast.    [provider]  isosorbide mononitrate (IMDUR) 120 MG 24 hr tablet Take 120 mg by mouth daily.    [provider]  levothyroxine (SYNTHROID, LEVOTHROID) 50 MCG tablet Take 50 mcg by mouth daily before breakfast.     [provider]  Melatonin 5 MG TABS Take 5 mg by mouth at bedtime.    [provider]  methylPREDNISolone (MEDROL DOSEPAK) 4 MG TBPK tablet Follow package instructions 06/19/18   Lennice Sites, DO  Multiple Vitamin (MULITIVITAMIN WITH MINERALS) TABS Take 1 tablet by mouth daily.    [provider]  Omega-3 Fatty Acids (FISH OIL) 1000 MG CAPS Take 1,000 mg by mouth See admin instructions. Take 1 capsule (1000 mg) by mouth every afternoon and evening    [provider]  oseltamivir (TAMIFLU) 30 MG capsule Take 1 capsule (30 mg total) by mouth 2 (two) times daily. 05/08/16   Rogue Bussing, MD  oseltamivir (TAMIFLU) 75 MG capsule Take 1 capsule (75 mg total) by mouth every 12 (twelve) hours. 05/04/16  Tanna Furry, MD  oxyCODONE-acetaminophen (PERCOCET/ROXICET) 5-325 MG tablet Take 1-2 tablets by mouth every 6 (six) hours as needed for moderate pain or severe pain. 06/11/18   Duffy Bruce, MD  rosuvastatin (CRESTOR) 10 MG tablet Take 1 tablet (10 mg total) by mouth daily. 05/09/16   Rogue Bussing, MD  torsemide (DEMADEX) 20 MG tablet Take 2 tablets (40 mg total) by mouth daily. 05/09/16   Rogue Bussing, MD  warfarin (COUMADIN) 2.5 MG tablet TAKE 7.5 MG ON M-W-F AND 5 MG TUES, THURS, SAT AND SUN AS DIRECTED Patient taking differently: TAKE 7.5 MG ON MON, WED, FRI, SAT AND TAKE 5 MG SUN, TUE, THU - AT 4PM - OR AS DIRECTED 02/10/16   Gordy Levan, MD    Inpatient Medications: Scheduled Meds:  Continuous Infusions:  PRN Meds:   Allergies:    Allergies  Allergen Reactions  . Morphine And Related Rash    Social History:   Social History   Socioeconomic History  . Marital status: Single    Spouse name: Not on file  . Number of children: Not on file  . Years of education: Not on file  . Highest education level: Not on file  Occupational History  . Not on file  Social Needs  . Financial resource strain: Not on file  . Food insecurity:    Worry: Not on file    Inability: Not on file  . Transportation needs:     Medical: Not on file    Non-medical: Not on file  Tobacco Use  . Smoking status: Former Smoker    Packs/day: 0.05    Years: 30.00    Pack years: 1.50    Types: Cigarettes  . Smokeless tobacco: Never Used  . Tobacco comment: 01/19/2013 "quit smoking cigarettes in the early '90's"  Substance and Sexual Activity  . Alcohol use: Yes    Comment: 01/19/2013 "drank years ago; last drink 03/2012"  . Drug use: No  . Sexual activity: Never  Lifestyle  . Physical activity:    Days per week: Not on file    Minutes per session: Not on file  . Stress: Not on file  Relationships  . Social connections:    Talks on phone: Not on file    Gets together: Not on file    Attends religious service: Not on file    Active member of club or organization: Not on file    Attends meetings of clubs or organizations: Not on file    Relationship status: Not on file  . Intimate partner violence:    Fear of current or ex partner: Not on file    Emotionally abused: Not on file    Physically abused: Not on file    Forced sexual activity: Not on file  Other Topics Concern  . Not on file  Social History Narrative  . Not on file    Family History:    Family History  Problem Relation Age of Onset  . Cerebral aneurysm Mother   . Hypertension Father   . Cerebral aneurysm Maternal Grandfather   . Cerebral aneurysm Maternal Aunt   . Cancer Maternal Uncle      ROS:  Please see the history of present illness.   All other ROS reviewed and negative.     Physical Exam/Data:   Vitals:   08/29/18 1145 08/29/18 1200 08/29/18 1215 08/29/18 1230  BP: (!) 144/52 (!) 140/59 (!) 137/51 (!) 155/52  Pulse:  66 (!) 35 (!)  37  Resp: (!) 23 12 10 14   Temp:      TempSrc:      SpO2: 100% 97% 97% 100%  Weight:      Height:       No intake or output data in the 24 hours ending 08/29/18 1321 Last 3 Weights 08/29/2018 06/19/2018 06/11/2018  Weight (lbs) 252 lb 257 lb 157 lb  Weight (kg) 114.306 kg 116.574 kg 71.215  kg     Body mass index is 46.09 kg/m.  General:  Middle age, morbidly obese female  HEENT: normal Lymph: no adenopathy Neck: no JVD Endocrine:  No thryomegaly Vascular: No carotid bruits; FA pulses 2+ bilaterally without bruits  Cardiac:  normal S1, S2,   , bradycardic  Lungs:  clear to auscultation bilaterally, no wheezing, rhonchi or rales  Abd: soft, nontender, no hepatomegaly  Ext: no edema Musculoskeletal:  No deformities, BUE and BLE strength normal and equal Skin: warm and dry  Neuro:  CNs 2-12 intact, no focal abnormalities noted Psych:  Normal affect   EKG:  The EKG was personally reviewed and demonstrates:  Slow atrial flutter with a slow ventricular response   Telemetry:  Telemetry was personally reviewed and demonstrates:  Slow atrial flutter with slow V response   Relevant CV Studies:   Laboratory Data:  ChemistryNo results for input(s): NA, K, CL, CO2, GLUCOSE, BUN, CREATININE, CALCIUM, GFRNONAA, GFRAA, ANIONGAP in the last 168 hours.  No results for input(s): PROT, ALBUMIN, AST, ALT, ALKPHOS, BILITOT in the last 168 hours. Hematology Recent Labs  Lab 08/29/18 1248  WBC 7.5  RBC 3.35*  HGB 10.7*  HCT 34.0*  MCV 101.5*  MCH 31.9  MCHC 31.5  RDW 14.9  PLT 140*   Cardiac EnzymesNo results for input(s): TROPONINI in the last 168 hours. No results for input(s): TROPIPOC in the last 168 hours.  BNPNo results for input(s): BNP, PROBNP in the last 168 hours.  DDimer No results for input(s): DDIMER in the last 168 hours.  Radiology/Studies:  No results found.  Assessment and Plan:   1. Syncope:  Had a syncopal episode .  Apparently had some seizure like activity following this.   Has had similar seizure like activity associated with passing out.   No post ictal symptom,  No loss of bowel or bladder function.   She has hx of hypothyroidism - will need TSH checked.   Will monitor her watching for any evidence of worsening AV block .  Will ask EP to see her .    Given her hx of recurrent syncopal episodes and her Aflutter with slow vent response, she may need a pacer at some point .   2.  HTN:    3.  Hyperlipidemia:  Needs to lose weight  4. Morbid obesity;   Needs to work on a better diet and exercise / weight loss plan          For questions or updates, please contact [place] Please consult [email or web address] for contact info under     Signed, Mertie Moores, MD  08/29/2018 1:21 PM

## 2018-08-29 NOTE — ED Notes (Signed)
ED TO INPATIENT HANDOFF REPORT  ED Nurse Name and Phone #:  [facility]my[facility] Ricks [place] Name/Age/Gender [name] 63 y.o. female Room/Bed: 035C/035C  Code Status   Code Status: Full Code  Home/SNF/Other Home Patient oriented to: self, place, time and situation Is this baseline? Yes   Triage Complete: Triage complete  Chief Complaint sz  Triage Note Pt BIB [facility] for seizure/loss of consciousness. Per EMS patient was found lying outside of her house at home. Per EMS patient reports she had a syncopal episode at home on her kitchen floor. Pt reports she felt dizzy and then woke up on the kitchen floor. Pt then went outside to get help and had another syncopal episode on her stairs outside. EMS reports patient had what appeared to be a seizure upon their arrival "pt stared off to one side and her body went stiff." Pt does have a past medical history of seizures. Pt complaining of neck pain, right shoulder pain, left knee and left foot pain. 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 Pt is alert and oriented x4 at present. Pt complaining of 10/10 left knee and foot pain. Pt also reporting 8/10 head/neck pain.    Allergies Allergies  Allergen Reactions  . Morphine And Related Rash    Level of Care/Admitting Diagnosis ED Disposition    ED Disposition Condition [facility] Area: [place] [[id number]]  Level of Care: Progressive [102]  Covid Evaluation: Screening Protocol (No Symptoms)  Diagnosis: Syncope [315176]  Admitting Physician: Karmen Bongo [[id number]]  Attending Physician: Karmen Bongo [[id number]]  Estimated length of stay: 3 - 4 days  Certification:: I certify this patient will need inpatient services for at least 2 midnights  Bed request comments: Per cardiology, 6E or 2C  PT Class (Do Not Modify): Inpatient [101]  PT Acc Code (Do Not Modify): Private [1]       B Medical/Surgery  History Past Medical History:  Diagnosis Date  . A-fib ([place])   . Anemia   . Anxiety   . Arthritis    "right hip; both knees; left wrist/shoulder; back" (01/19/2013"  . Bleeding on Coumadin 08/2012; 01/18/2013   [facility] admissions (01/19/2013)  . CHF (congestive heart failure) ([place])    "2-3 times" (01/19/2013)  . Chronic lower back pain   . Depression   . DVT (deep venous thrombosis) ([place]) 10 years ago   numerous/notes 01/18/2013  . GERD (gastroesophageal reflux disease)   . Gout   . HYWVPXTG(626.9)    "maybe weekly" (01/19/2013)  . Heart murmur   . High cholesterol    "been off RX for this at one time" (01/18/2013)  . History of blood transfusion 1983; 04/2012   "3 w/ childbirth; hospitalized for pain" (01/19/2013)  . Hypertension   . Hypothyroidism   . Migraines    "twice/yr maybe" (01/19/2013)  . OSA (obstructive sleep apnea)    "sent me for test in 04/2012; never ordered mask, etc" (01/19/2013)  . PE (pulmonary thromboembolism) ([place]) 3 years ag0   3/notes 01/18/2013  . Pneumonia before 2011   "once' (01/18/2013)  . Renal disorder    kindey function low; "Metformin was destroying my kidneys" (01/19/2013)  . Shortness of breath    "only related to my CHF" (01/18/2013)  . Swelling of hand 08/31/2014   RT HAND  . Type II diabetes mellitus ([place])   . UTI (urinary tract infection) 08/31/2014  Past Surgical History:  Procedure Laterality Date  . CARDIAC CATHETERIZATION N/A 03/13/2015   Procedure: Right/Left Heart Cath and Coronary Angiography;  Surgeon: Adrian Prows, MD;  Location: [facility];  Service: Cardiovascular;  Laterality: N/A;  . CATARACT EXTRACTION W/ INTRAOCULAR LENS  IMPLANT, BILATERAL Bilateral 2006-2011  . CESAREAN SECTION  1983  . CHOLECYSTECTOMY  ~ 2002  . COLONOSCOPY N/A 01/21/2013   Procedure: COLONOSCOPY;  Surgeon: Beryle Beams, MD;  Location: [facility];  Service: Endoscopy;  Laterality: N/A;  . EYE SURGERY Bilateral    "multiple" (01/18/2013)   . PARS PLANA REPAIR OF RETINAL DEATACHMENT Right   . PARS PLANA VITRECTOMY Bilateral 2004-2006   "several" (01/18/2013)  . REFRACTIVE SURGERY Bilateral    "for stigmatism" (01/18/2013)  . REFRACTIVE SURGERY Left ~ 11/2012   "to puff it up cause vision got hazy" (01/18/2013)  . VENA CAVA FILTER PLACEMENT  2011?     A IV Location/Drains/Wounds Patient Lines/Drains/Airways Status   Active Line/Drains/Airways    Name:   Placement date:   Placement time:   Site:   Days:   Peripheral IV 08/29/18 Left Hand   08/29/18    1122    Hand   less than 1   External Urinary Catheter   06/11/18    1110    -   79          Intake/Output Last 24 hours  Intake/Output Summary (Last 24 hours) at 08/29/2018 1629 Last data filed at 08/29/2018 1556 Gross per 24 hour  Intake 500 ml  Output 2200 ml  Net -1700 ml    Labs/Imaging Results for orders placed or performed during the hospital encounter of 08/29/18 (from the past 48 hour(s))  CBG monitoring, ED     Status: Abnormal   Collection Time: 08/29/18 11:24 AM  Result Value Ref Range   Glucose-Capillary 167 (H) 70 - 99 mg/dL  CBC with Differential     Status: Abnormal   Collection Time: 08/29/18 12:48 PM  Result Value Ref Range   WBC 7.5 4.0 - 10.5 K/uL   RBC 3.35 (L) 3.87 - 5.11 MIL/uL   Hemoglobin 10.7 (L) 12.0 - 15.0 g/dL   HCT 34.0 (L) 36.0 - 46.0 %   MCV 101.5 (H) 80.0 - 100.0 fL   MCH 31.9 26.0 - 34.0 pg   MCHC 31.5 30.0 - 36.0 g/dL   RDW 14.9 11.5 - 15.5 %   Platelets 140 (L) 150 - 400 K/uL   nRBC 0.0 0.0 - 0.2 %   Neutrophils Relative % 84 %   Neutro Abs 6.3 1.7 - 7.7 K/uL   Lymphocytes Relative 8 %   Lymphs Abs 0.6 (L) 0.7 - 4.0 K/uL   Monocytes Relative 6 %   Monocytes Absolute 0.4 0.1 - 1.0 K/uL   Eosinophils Relative 2 %   Eosinophils Absolute 0.1 0.0 - 0.5 K/uL   Basophils Relative 0 %   Basophils Absolute 0.0 0.0 - 0.1 K/uL   Immature Granulocytes 0 %   Abs Immature Granulocytes 0.03 0.00 - 0.07 K/uL    Comment:  Performed at [facility], [address] [place], [place] [address]  Comprehensive metabolic panel     Status: Abnormal   Collection Time: 08/29/18 12:48 PM  Result Value Ref Range   Sodium 135 135 - 145 mmol/L   Potassium 5.6 (H) 3.5 - 5.1 mmol/L   Chloride 103 98 - 111 mmol/L   CO2 22 22 - 32 mmol/L  Glucose, Bld 174 (H) 70 - 99 mg/dL   BUN 64 (H) 8 - 23 mg/dL   Creatinine, Ser 2.24 (H) 0.44 - 1.00 mg/dL   Calcium 9.1 8.9 - 10.3 mg/dL   Total Protein 6.3 (L) 6.5 - 8.1 g/dL   Albumin 3.9 3.5 - 5.0 g/dL   AST 39 15 - 41 U/L   ALT 27 0 - 44 U/L   Alkaline Phosphatase 55 38 - 126 U/L   Total Bilirubin 0.9 0.3 - 1.2 mg/dL   GFR calc non Af Amer 23 (L) >60 mL/min   GFR calc Af Amer 26 (L) >60 mL/min   Anion gap 10 5 - 15    Comment: Performed at [facility] [address] [place], [place] [address]  Protime-INR     Status: Abnormal   Collection Time: 08/29/18 12:48 PM  Result Value Ref Range   Prothrombin Time 28.6 (H) 11.4 - 15.2 seconds   INR 2.7 (H) 0.8 - 1.2    Comment: (NOTE) INR goal varies based on device and disease states. Performed at [facility], [place] [address], [place],  [address]   SARS Coronavirus 2 (CEPHEID - Performed in [facility] hospital lab), Hosp Order     Status: None   Collection Time: 08/29/18  1:12 PM  Result Value Ref Range   SARS Coronavirus 2 NEGATIVE NEGATIVE    Comment: (NOTE) If result is NEGATIVE SARS-CoV-2 target nucleic acids are NOT DETECTED. The SARS-CoV-2 RNA is generally detectable in upper and lower  respiratory specimens during the acute phase of infection. The lowest  concentration of SARS-CoV-2 viral copies this assay can detect is 250  copies / mL. A negative result does not preclude SARS-CoV-2 infection  and should not be used as the sole basis for treatment or other  patient management decisions.  A negative result may occur with  improper specimen collection / handling, submission of specimen other   than nasopharyngeal swab, presence of viral mutation(s) within the  areas targeted by this assay, and inadequate number of viral copies  (<250 copies / mL). A negative result must be combined with clinical  observations, patient history, and epidemiological information. If result is POSITIVE SARS-CoV-2 target nucleic acids are DETECTED. The SARS-CoV-2 RNA is generally detectable in upper and lower  respiratory specimens dur ing the acute phase of infection.  Positive  results are indicative of active infection with SARS-CoV-2.  Clinical  correlation with patient history and other diagnostic information is  necessary to determine patient infection status.  Positive results do  not rule out bacterial infection or co-infection with other viruses. If result is PRESUMPTIVE POSTIVE SARS-CoV-2 nucleic acids MAY BE PRESENT.   A presumptive positive result was obtained on the submitted specimen  and confirmed on repeat testing.  While 2019 novel coronavirus  (SARS-CoV-2) nucleic acids may be present in the submitted sample  additional confirmatory testing may be necessary for epidemiological  and / or clinical management purposes  to differentiate between  SARS-CoV-2 and other Sarbecovirus currently known to infect humans.  If clinically indicated additional testing with an alternate test  methodology [phone]) is advised. The SARS-CoV-2 RNA is generally  detectable in upper and lower respiratory sp ecimens during the acute  phase of infection. The expected result is Negative. Fact Sheet for Patients:  StrictlyIdeas.no Fact Sheet for Healthcare Providers: BankingDealers.co.za This test is not yet approved or cleared by the [place] FDA and has been authorized for detection  and/or diagnosis of SARS-CoV-2 by FDA under an Emergency Use Authorization (EUA).  This EUA will remain in effect (meaning this test can be used) for the duration of  the COVID-19 declaration under Section 564(b)(1) of the Act, 21 U.S.C. section 360bbb-3(b)(1), unless the authorization is terminated or revoked sooner. Performed at [facility], [place] [address], [place], [place] [address]   Urinalysis, Routine w reflex microscopic     Status: Abnormal   Collection Time: 08/29/18  3:56 PM  Result Value Ref Range   Color, Urine YELLOW YELLOW   APPearance HAZY (A) CLEAR   Specific Gravity, Urine 1.008 1.005 - 1.030   pH 7.0 5.0 - 8.0   Glucose, UA NEGATIVE NEGATIVE mg/dL   Hgb urine dipstick SMALL (A) NEGATIVE   Bilirubin Urine NEGATIVE NEGATIVE   Ketones, ur NEGATIVE NEGATIVE mg/dL   Protein, ur 30 (A) NEGATIVE mg/dL   Nitrite NEGATIVE NEGATIVE   Leukocytes,Ua MODERATE (A) NEGATIVE   RBC / HPF 0-5 0 - 5 RBC/hpf   WBC, UA 21-50 0 - 5 WBC/hpf   Bacteria, UA FEW (A) NONE SEEN   Mucus PRESENT    Hyaline Casts, UA PRESENT    Non Squamous Epithelial 0-5 (A) NONE SEEN    Comment: Performed at [facility], [place] [address] [place], [place] [address]   Ct Head Wo Contrast  Result Date: 08/29/2018 CLINICAL DATA:  Seizure, unwitnessed fall EXAM: CT HEAD WITHOUT CONTRAST CT CERVICAL SPINE WITHOUT CONTRAST TECHNIQUE: Multidetector CT imaging of the head and cervical spine was performed following the standard protocol without intravenous contrast. Multiplanar CT image reconstructions of the cervical spine were also generated. COMPARISON:  09/08/2012 FINDINGS: CT HEAD FINDINGS Brain: No evidence of acute infarction, hemorrhage, hydrocephalus, extra-axial collection or mass lesion/mass effect. Mild periventricular white matter hypodensity. Vascular: No hyperdense vessel or unexpected calcification. Skull: Normal. Negative for fracture or focal lesion. Sinuses/Orbits: No acute finding. Other: None. CT CERVICAL SPINE FINDINGS Alignment: Normal. Skull base and vertebrae: There is a minimally displaced fracture of the left facet of C6 (series 11, image 33,  series 9, image 58). There are additional comminuted fractures of the right facet and pedicle of C7, this level somewhat limited by motion artifact (series 12, image 60, series 11, image 34). No primary bone lesion or focal pathologic process. Soft tissues and spinal canal: No prevertebral fluid or swelling. No visible canal hematoma. Disc levels: There is severe multilevel disc and facet degenerative disease, worst from C3 through C7. Upper chest: Negative. Other: None. IMPRESSION: 1. No acute intracranial pathology. Small-vessel white matter disease. 2. There is a minimally displaced fracture of the left facet of C6 (series 11, image 33, series 9, image 58). There are additional comminuted fractures of the right facet and pedicle of C7, this level somewhat limited by motion artifact (series 12, image 60, series 11, image 34). 3.  No other fracture or static subluxation of the cervical spine. 4. Severe multilevel disc and facet degenerative disease of the cervical spine. Electronically Signed   By: Eddie Candle M.D.   On: 08/29/2018 15:03   Ct Cervical Spine Wo Contrast  Result Date: 08/29/2018 CLINICAL DATA:  Seizure, unwitnessed fall EXAM: CT HEAD WITHOUT CONTRAST CT CERVICAL SPINE WITHOUT CONTRAST TECHNIQUE: Multidetector CT imaging of the head and cervical spine was performed following the standard protocol without intravenous contrast. Multiplanar CT image reconstructions of the cervical spine were also generated. COMPARISON:  09/08/2012 FINDINGS: CT HEAD FINDINGS Brain: No evidence of  acute infarction, hemorrhage, hydrocephalus, extra-axial collection or mass lesion/mass effect. Mild periventricular white matter hypodensity. Vascular: No hyperdense vessel or unexpected calcification. Skull: Normal. Negative for fracture or focal lesion. Sinuses/Orbits: No acute finding. Other: None. CT CERVICAL SPINE FINDINGS Alignment: Normal. Skull base and vertebrae: There is a minimally displaced fracture of the left  facet of C6 (series 11, image 33, series 9, image 58). There are additional comminuted fractures of the right facet and pedicle of C7, this level somewhat limited by motion artifact (series 12, image 60, series 11, image 34). No primary bone lesion or focal pathologic process. Soft tissues and spinal canal: No prevertebral fluid or swelling. No visible canal hematoma. Disc levels: There is severe multilevel disc and facet degenerative disease, worst from C3 through C7. Upper chest: Negative. Other: None. IMPRESSION: 1. No acute intracranial pathology. Small-vessel white matter disease. 2. There is a minimally displaced fracture of the left facet of C6 (series 11, image 33, series 9, image 58). There are additional comminuted fractures of the right facet and pedicle of C7, this level somewhat limited by motion artifact (series 12, image 60, series 11, image 34). 3.  No other fracture or static subluxation of the cervical spine. 4. Severe multilevel disc and facet degenerative disease of the cervical spine. Electronically Signed   By: Eddie Candle M.D.   On: 08/29/2018 15:03    Pending Labs Unresulted Labs (From admission, onward)    Start     Ordered   08/30/18 0932  Basic metabolic panel  Tomorrow morning,   R     08/29/18 1611   08/30/18 0500  CBC  Tomorrow morning,   R     08/29/18 1611   08/29/18 1608  HIV antibody (Routine Testing)  Once,   R     08/29/18 1611   08/29/18 1608  TSH  Once,   R     08/29/18 1611   08/29/18 1608  T4, free  Once,   R     08/29/18 1611   08/29/18 1230  Rapid urine drug screen (hospital performed)  ONCE - STAT,   R     08/29/18 1229          Vitals/Pain Today's Vitals   08/29/18 1400 08/29/18 1415 08/29/18 1600 08/29/18 1615  BP: (!) 147/44 (!) 147/46 (!) 166/47 (!) 160/50  Pulse: (!) 41 (!) 38 (!) 39 (!) 39  Resp: 18 12 12 15   Temp:      TempSrc:      SpO2: 100% 99% 100% 100%  Weight:      Height:        Isolation Precautions No active  isolations  Medications Medications  allopurinol (ZYLOPRIM) tablet 300 mg (has no administration in time range)  oxyCODONE-acetaminophen (PERCOCET/ROXICET) 5-325 MG per tablet 1-2 tablet (has no administration in time range)  isosorbide mononitrate (IMDUR) 24 hr tablet 120 mg (has no administration in time range)  rosuvastatin (CRESTOR) tablet 10 mg (has no administration in time range)  citalopram (CELEXA) tablet 20 mg (has no administration in time range)  Insulin Detemir (LEVEMIR) FlexPen 24 Units (has no administration in time range)  levothyroxine (SYNTHROID) tablet 50 mcg (has no administration in time range)  Melatonin TABS 5 mg (has no administration in time range)  sodium chloride flush (NS) 0.9 % injection 3 mL (has no administration in time range)  lactated ringers infusion (has no administration in time range)  acetaminophen (TYLENOL) tablet 650 mg (has no administration in time  range)    Or  acetaminophen (TYLENOL) suppository 650 mg (has no administration in time range)  hydrALAZINE (APRESOLINE) injection 5 mg (has no administration in time range)  ondansetron (ZOFRAN) tablet 4 mg (has no administration in time range)    Or  ondansetron (ZOFRAN) injection 4 mg (has no administration in time range)  sodium chloride 0.9 % bolus 500 mL (0 mLs Intravenous Stopped 08/29/18 1556)    Mobility walks Moderate fall risk   Focused Assessments Neuro Assessment Handoff:  Swallow screen pass? Yes  Cardiac Rhythm: Atrial fibrillation       Neuro Assessment:   Neuro Checks:      Last Documented NIHSS Modified Score:   Has TPA been given? No If patient is a Neuro Trauma and patient is going to OR before floor call report to [facility] nurse: [phone] or [phone]     R Recommendations: See Admitting Provider Note  Report given to:   Additional Notes:

## 2018-08-29 NOTE — Consult Note (Signed)
Neurosurgery Consultation  Reason for Consult: Closed fracture of the cervical spine Referring Physician: Ray  CC: Fall  HPI: This is a 63 y.o. woman with a history of morbid obesity, PAF on warfarin, recurrent PE x3, CHF, CKD, T2DM, OSA that presents after a fall due to syncope. She has some baseline radicular pain and neck pain for which she was supposed to see neurosurgery but she cancelled her appointment due to the [place] pandemic. She currently has those symptoms as well as worsened neck pain, but no new radicular symptoms. No new weakness, numbness, or parasthesias. Neck pain is worse with motion, improved with rest, sharp, moderate in intensity, located in the midline at the C-T junction without radiation.   ROS: A 14 point ROS was performed and is negative except as noted in the HPI.   PMHx:  Past Medical History:  Diagnosis Date  . A-fib ([place])   . Anemia   . Anxiety   . Arthritis    "right hip; both knees; left wrist/shoulder; back" (01/19/2013"  . Bleeding on Coumadin 08/2012; 01/18/2013   [facility] admissions (01/19/2013)  . CHF (congestive heart failure) ([place])    "2-3 times" (01/19/2013)  . Chronic lower back pain   . Depression   . DVT (deep venous thrombosis) ([place]) 10 years ago   numerous/notes 01/18/2013  . GERD (gastroesophageal reflux disease)   . Gout   . SWNIOEVO(350.0)    "maybe weekly" (01/19/2013)  . Heart murmur   . High cholesterol    "been off RX for this at one time" (01/18/2013)  . History of blood transfusion 1983; 04/2012   "3 w/ childbirth; hospitalized for pain" (01/19/2013)  . Hypertension   . Hypothyroidism   . Migraines    "twice/yr maybe" (01/19/2013)  . OSA (obstructive sleep apnea)    "sent me for test in 04/2012; never ordered mask, etc" (01/19/2013)  . PE (pulmonary thromboembolism) ([place]) 3 years ag0   3/notes 01/18/2013  . Pneumonia before 2011   "once' (01/18/2013)  . Renal disorder    kindey function low; "Metformin was destroying my  kidneys" (01/19/2013)  . Shortness of breath    "only related to my CHF" (01/18/2013)  . Swelling of hand 08/31/2014   RT HAND  . Type II diabetes mellitus ([place])   . UTI (urinary tract infection) 08/31/2014   FamHx:  Family History  Problem Relation Age of Onset  . Cerebral aneurysm Mother   . Hypertension Father   . Cerebral aneurysm Maternal Grandfather   . Cerebral aneurysm Maternal Aunt   . Cancer Maternal Uncle    SocHx:  reports that she quit smoking about 30 years ago. Her smoking use included cigarettes. She has a 1.50 pack-year smoking history. She has never used smokeless tobacco. She reports previous alcohol use. She reports that she does not use drugs.  Exam: Vital signs in last 24 hours: Temp:  [98.3 F (36.8 C)] 98.3 F (36.8 C) (05/24 1121) Pulse Rate:  [35-66] 38 (05/24 1415) Resp:  [10-24] 12 (05/24 1415) BP: (137-160)/(44-62) 147/46 (05/24 1415) SpO2:  [96 %-100 %] 99 % (05/24 1415) Weight:  [114.3 kg] 114.3 kg (05/24 1122) General:lying in bed, appears chronically ill Head: atraumatic, normocephalic HEENT: in well-fitted cervical collar with chin supported properly Pulmonary: breathing supplemental O2 with no distress Psych: affect full and reactive Abdomen: obese, S NT Extremities: warm and well perfused, painful L knee that with limited ROM Neuro: AOx3, PERRL, EOMI, FS Strength 5/5 x4, SILTx4, no drift  Assessment and Plan: 62 y.o. woman with multiple severe comorbidities, on warfarin s/p fall 2/2 syncope. CT C-spine personally reviewed, which shows L C6 unilateral facet fracture, right C7 unilateral facet fracture with possible extension into the right C7 pedicle.   -recommend rigid cervical collar. Discussed with the patient at length. Given her obesity and multiple serious comorbidities, she is a very poor operative candidate. I recommend a trial of rigid cervical collar with radiographic follow up as an outpatient to see if she can heal these  fractures non-operatively.  -pt should follow up with me in 2 weeks in the office, she can call [phone] to make an appointment -please call with any concerns or questions  Judith Part, MD 08/29/18 3:54 PM [facility]

## 2018-08-29 NOTE — ED Notes (Signed)
Patient transported to CT 

## 2018-08-29 NOTE — Progress Notes (Signed)
ANTICOAGULATION CONSULT NOTE - Initial Consult  Pharmacy Consult for Heparin (when INR<2) Indication: atrial fibrillation  Allergies  Allergen Reactions  . Morphine And Related Rash    Patient Measurements: Height: 5\' 2"  (157.5 cm) Weight: 252 lb (114.3 kg) IBW/kg (Calculated) : 50.1 Heparin Dosing Weight: 78 kg  Vital Signs: Temp: 98.3 F (36.8 C) (05/24 1121) Temp Source: Oral (05/24 1121) BP: 160/50 (05/24 1615) Pulse Rate: 39 (05/24 1615)  Labs: Recent Labs    08/29/18 1248  HGB 10.7*  HCT 34.0*  PLT 140*  LABPROT 28.6*  INR 2.7*  CREATININE 2.24*    Estimated Creatinine Clearance: 31.2 mL/min (A) (by C-G formula based on SCr of 2.24 mg/dL (H)).   Medical History: Past Medical History:  Diagnosis Date  . A-fib ([place])   . Anemia   . Anxiety   . Arthritis    "right hip; both knees; left wrist/shoulder; back" (01/19/2013"  . Bleeding on Coumadin 08/2012; 01/18/2013   [facility] admissions (01/19/2013)  . CHF (congestive heart failure) ([place])    "2-3 times" (01/19/2013)  . Chronic lower back pain   . Depression   . DVT (deep venous thrombosis) ([place]) 10 years ago   numerous/notes 01/18/2013  . GERD (gastroesophageal reflux disease)   . Gout   . CWUGQBVQ(945.0)    "maybe weekly" (01/19/2013)  . Heart murmur   . High cholesterol    "been off RX for this at one time" (01/18/2013)  . History of blood transfusion 1983; 04/2012   "3 w/ childbirth; hospitalized for pain" (01/19/2013)  . Hypertension   . Hypothyroidism   . Migraines    "twice/yr maybe" (01/19/2013)  . OSA (obstructive sleep apnea)    "sent me for test in 04/2012; never ordered mask, etc" (01/19/2013)  . PE (pulmonary thromboembolism) ([place]) 3 years ag0   3/notes 01/18/2013  . Pneumonia before 2011   "once' (01/18/2013)  . Renal disorder    kindey function low; "Metformin was destroying my kidneys" (01/19/2013)  . Shortness of breath    "only related to my CHF" (01/18/2013)  . Swelling of hand  08/31/2014   RT HAND  . Type II diabetes mellitus ([place])   . UTI (urinary tract infection) 08/31/2014    Assessment: [name] who [facility] with syncope/seizures. The patient was on warfarin PTA for hx Afib - being held in case procedure are needed. Pharmacy consulted to start Heparin when INR<2.  Admit INR 2.7 on PTA dose. Will hold heparin initiation for now to allow INR to trend down.  Goal of Therapy:  Heparin level 0.3-0.7 units/ml Monitor platelets by anticoagulation protocol: Yes   Plan:  - Hold Heparin drip for now - Will plan to initiate when INR<2 - Will f/u with INR in the AM  Thank you for allowing pharmacy to be a part of this patient's care.  Alycia Rossetti, PharmD, BCPS Clinical Pharmacist Clinical phone for 08/29/2018: [phone] 08/29/2018 4:35 PM   **Pharmacist phone directory can now be found on [email or web address] ([email or web address]).  Listed under [place].

## 2018-08-30 ENCOUNTER — Inpatient Hospital Stay (HOSPITAL_COMMUNITY): Payer: Medicare HMO

## 2018-08-30 DIAGNOSIS — I442 Atrioventricular block, complete: Secondary | ICD-10-CM

## 2018-08-30 DIAGNOSIS — I441 Atrioventricular block, second degree: Principal | ICD-10-CM

## 2018-08-30 LAB — CBC
HCT: 29.7 % — ABNORMAL LOW (ref 36.0–46.0)
Hemoglobin: 9.5 g/dL — ABNORMAL LOW (ref 12.0–15.0)
MCH: 32 pg (ref 26.0–34.0)
MCHC: 32 g/dL (ref 30.0–36.0)
MCV: 100 fL (ref 80.0–100.0)
Platelets: 117 10*3/uL — ABNORMAL LOW (ref 150–400)
RBC: 2.97 MIL/uL — ABNORMAL LOW (ref 3.87–5.11)
RDW: 15.1 % (ref 11.5–15.5)
WBC: 7 10*3/uL (ref 4.0–10.5)
nRBC: 0 % (ref 0.0–0.2)

## 2018-08-30 LAB — BASIC METABOLIC PANEL
Anion gap: 8 (ref 5–15)
BUN: 61 mg/dL — ABNORMAL HIGH (ref 8–23)
CO2: 23 mmol/L (ref 22–32)
Calcium: 8.7 mg/dL — ABNORMAL LOW (ref 8.9–10.3)
Chloride: 107 mmol/L (ref 98–111)
Creatinine, Ser: 2.23 mg/dL — ABNORMAL HIGH (ref 0.44–1.00)
GFR calc Af Amer: 27 mL/min — ABNORMAL LOW (ref >60)
GFR calc non Af Amer: 23 mL/min — ABNORMAL LOW (ref >60)
Glucose, Bld: 145 mg/dL — ABNORMAL HIGH (ref 70–99)
Potassium: 5.1 mmol/L (ref 3.5–5.1)
Sodium: 138 mmol/L (ref 135–145)

## 2018-08-30 LAB — PROTIME-INR
INR: 2.8 — ABNORMAL HIGH (ref 0.8–1.2)
Prothrombin Time: 29.1 seconds — ABNORMAL HIGH (ref 11.4–15.2)

## 2018-08-30 LAB — LIPID PANEL
Cholesterol: 107 mg/dL (ref 0–200)
HDL: 49 mg/dL (ref >40)
LDL Cholesterol: 48 mg/dL (ref 0–99)
Total CHOL/HDL Ratio: 2.2 RATIO
Triglycerides: 48 mg/dL (ref <150)
VLDL: 10 mg/dL (ref 0–40)

## 2018-08-30 LAB — TROPONIN I
Troponin I: 0.18 ng/mL (ref <0.03)
Troponin I: 0.16 ng/mL (ref <0.03)

## 2018-08-30 LAB — MRSA PCR SCREENING: MRSA by PCR: NEGATIVE

## 2018-08-30 LAB — HIV ANTIBODY (ROUTINE TESTING W REFLEX): HIV Screen 4th Generation wRfx: NONREACTIVE

## 2018-08-30 LAB — GLUCOSE, CAPILLARY
Glucose-Capillary: 141 mg/dL — ABNORMAL HIGH (ref 70–99)
Glucose-Capillary: 126 mg/dL — ABNORMAL HIGH (ref 70–99)
Glucose-Capillary: 130 mg/dL — ABNORMAL HIGH (ref 70–99)

## 2018-08-30 MED ORDER — NITROGLYCERIN 0.4 MG SL SUBL
0.4000 mg | SUBLINGUAL_TABLET | SUBLINGUAL | Status: DC | PRN
  Administered 2018-08-30: 11:00:00 0.4 mg via SUBLINGUAL

## 2018-08-30 MED ORDER — ATROPINE SULFATE 1 MG/10ML IJ SOSY
PREFILLED_SYRINGE | INTRAMUSCULAR | Status: AC
  Filled 2018-08-30: qty 10

## 2018-08-30 MED ORDER — INSULIN DETEMIR 100 UNIT/ML ~~LOC~~ SOLN
16.0000 [IU] | Freq: Every day | SUBCUTANEOUS | Status: DC
  Administered 2018-08-31 – 2018-09-03 (×4): 16 [IU] via SUBCUTANEOUS
  Filled 2018-08-30 (×5): qty 0.16

## 2018-08-30 MED ORDER — CHLORHEXIDINE GLUCONATE CLOTH 2 % EX PADS
6.0000 | MEDICATED_PAD | Freq: Every day | CUTANEOUS | Status: DC
  Administered 2018-08-30 – 2018-08-31 (×2): 6 via TOPICAL

## 2018-08-30 MED ORDER — MUPIROCIN CALCIUM 2 % EX CREA
TOPICAL_CREAM | Freq: Two times a day (BID) | CUTANEOUS | Status: DC
  Administered 2018-08-30: 1 via TOPICAL
  Administered 2018-08-31 – 2018-09-03 (×7): via TOPICAL
  Filled 2018-08-30 (×2): qty 15

## 2018-08-30 MED ORDER — NITROGLYCERIN 0.4 MG SL SUBL
SUBLINGUAL_TABLET | SUBLINGUAL | Status: AC
  Administered 2018-08-30: 11:00:00 0.4 mg via SUBLINGUAL
  Filled 2018-08-30: qty 1

## 2018-08-30 MED ORDER — ROSUVASTATIN CALCIUM 5 MG PO TABS
10.0000 mg | ORAL_TABLET | Freq: Every day | ORAL | Status: DC
  Administered 2018-08-30 – 2018-09-02 (×4): 10 mg via ORAL
  Filled 2018-08-30 (×4): qty 2

## 2018-08-30 NOTE — Progress Notes (Signed)
Progress Note  Patient Name: [name] Date of Encounter: 08/30/2018  Primary Cardiologist: No primary care provider on file.   Subjective   HR 29-55, experiencing nausea but BP stable and mentating clearly. No chest pain, has not ambulated today so cannot comment on her dizziness/lightheadedness.  Inpatient Medications    Scheduled Meds:  atropine       allopurinol  300 mg Oral Daily   Chlorhexidine Gluconate Cloth  6 each Topical Daily   citalopram  20 mg Oral QHS   insulin aspart  0-5 Units Subcutaneous QHS   insulin aspart  0-9 Units Subcutaneous TID WC   [START ON 08/31/2018] insulin detemir  16 Units Subcutaneous QAC breakfast   isosorbide mononitrate  120 mg Oral Daily   levothyroxine  50 mcg Oral Q0600   Melatonin  6 mg Oral QHS   mupirocin cream   Topical BID   rosuvastatin  10 mg Oral Daily   sodium chloride flush  3 mL Intravenous Q12H   Continuous Infusions:  lactated ringers 75 mL/hr at 08/30/18 0721   PRN Meds: acetaminophen **OR** acetaminophen, hydrALAZINE, ondansetron **OR** ondansetron (ZOFRAN) IV, oxyCODONE-acetaminophen   Vital Signs    Vitals:   08/30/18 0036 08/30/18 0605 08/30/18 0723 08/30/18 0858  BP: (!) 139/53 (!) 132/44 135/75   Pulse: (!) 33 (!) 37 (!) 31   Resp: 12 13 14    Temp: 99.7 F (37.6 C) 99.2 F (37.3 C)  98.3 F (36.8 C)  TempSrc: Oral Oral  Oral  SpO2: 95% 97% 97%   Weight:  112.8 kg    Height:        Intake/Output Summary (Last 24 hours) at 08/30/2018 1029 Last data filed at 08/30/2018 0400 Gross per 24 hour  Intake 1460 ml  Output 2900 ml  Net -1440 ml   Filed Weights   08/29/18 1122 08/30/18 0605  Weight: 114.3 kg 112.8 kg    Telemetry    HR 29, atrial flutter with slow ventricular response - Personally Reviewed  ECG    No new  Physical Exam   GEN: No acute distress.   Neck: unable to assess JVD due to mandatory c-collar Cardiac: regular rhythm, bradycardic rate, no 2/6 systolic  murmur, rubs, or gallops.  Respiratory: Clear to auscultation bilaterally. GI: Soft, nontender, non-distended  MS: No edema; No deformity. Neuro:  Nonfocal  Psych: Normal affect  Skin: covered wounds on shins.   Labs    Chemistry Recent Labs  Lab 08/29/18 1248 08/30/18 0319  NA 135 138  K 5.6* 5.1  CL 103 107  CO2 22 23  GLUCOSE 174* 145*  BUN 64* 61*  CREATININE 2.24* 2.23*  CALCIUM 9.1 8.7*  PROT 6.3*  --   ALBUMIN 3.9  --   AST 39  --   ALT 27  --   ALKPHOS 55  --   BILITOT 0.9  --   GFRNONAA 23* 23*  GFRAA 26* 27*  ANIONGAP 10 8     Hematology Recent Labs  Lab 08/29/18 1248 08/30/18 0319  WBC 7.5 7.0  RBC 3.35* 2.97*  HGB 10.7* 9.5*  HCT 34.0* 29.7*  MCV 101.5* 100.0  MCH 31.9 32.0  MCHC 31.5 32.0  RDW 14.9 15.1  PLT 140* 117*    Cardiac EnzymesNo results for input(s): TROPONINI in the last 168 hours. No results for input(s): TROPIPOC in the last 168 hours.   BNPNo results for input(s): BNP, PROBNP in the last 168 hours.   DDimer  No results for input(s): DDIMER in the last 168 hours.   Radiology    Ct Head Wo Contrast  Result Date: 08/29/2018 CLINICAL DATA:  Seizure, unwitnessed fall EXAM: CT HEAD WITHOUT CONTRAST CT CERVICAL SPINE WITHOUT CONTRAST TECHNIQUE: Multidetector CT imaging of the head and cervical spine was performed following the standard protocol without intravenous contrast. Multiplanar CT image reconstructions of the cervical spine were also generated. COMPARISON:  09/08/2012 FINDINGS: CT HEAD FINDINGS Brain: No evidence of acute infarction, hemorrhage, hydrocephalus, extra-axial collection or mass lesion/mass effect. Mild periventricular white matter hypodensity. Vascular: No hyperdense vessel or unexpected calcification. Skull: Normal. Negative for fracture or focal lesion. Sinuses/Orbits: No acute finding. Other: None. CT CERVICAL SPINE FINDINGS Alignment: Normal. Skull base and vertebrae: There is a minimally displaced fracture of the  left facet of C6 (series 11, image 33, series 9, image 58). There are additional comminuted fractures of the right facet and pedicle of C7, this level somewhat limited by motion artifact (series 12, image 60, series 11, image 34). No primary bone lesion or focal pathologic process. Soft tissues and spinal canal: No prevertebral fluid or swelling. No visible canal hematoma. Disc levels: There is severe multilevel disc and facet degenerative disease, worst from C3 through C7. Upper chest: Negative. Other: None. IMPRESSION: 1. No acute intracranial pathology. Small-vessel white matter disease. 2. There is a minimally displaced fracture of the left facet of C6 (series 11, image 33, series 9, image 58). There are additional comminuted fractures of the right facet and pedicle of C7, this level somewhat limited by motion artifact (series 12, image 60, series 11, image 34). 3.  No other fracture or static subluxation of the cervical spine. 4. Severe multilevel disc and facet degenerative disease of the cervical spine. Electronically Signed   By: Eddie Candle M.D.   On: 08/29/2018 15:03   Ct Cervical Spine Wo Contrast  Result Date: 08/29/2018 CLINICAL DATA:  Seizure, unwitnessed fall EXAM: CT HEAD WITHOUT CONTRAST CT CERVICAL SPINE WITHOUT CONTRAST TECHNIQUE: Multidetector CT imaging of the head and cervical spine was performed following the standard protocol without intravenous contrast. Multiplanar CT image reconstructions of the cervical spine were also generated. COMPARISON:  09/08/2012 FINDINGS: CT HEAD FINDINGS Brain: No evidence of acute infarction, hemorrhage, hydrocephalus, extra-axial collection or mass lesion/mass effect. Mild periventricular white matter hypodensity. Vascular: No hyperdense vessel or unexpected calcification. Skull: Normal. Negative for fracture or focal lesion. Sinuses/Orbits: No acute finding. Other: None. CT CERVICAL SPINE FINDINGS Alignment: Normal. Skull base and vertebrae: There is a  minimally displaced fracture of the left facet of C6 (series 11, image 33, series 9, image 58). There are additional comminuted fractures of the right facet and pedicle of C7, this level somewhat limited by motion artifact (series 12, image 60, series 11, image 34). No primary bone lesion or focal pathologic process. Soft tissues and spinal canal: No prevertebral fluid or swelling. No visible canal hematoma. Disc levels: There is severe multilevel disc and facet degenerative disease, worst from C3 through C7. Upper chest: Negative. Other: None. IMPRESSION: 1. No acute intracranial pathology. Small-vessel white matter disease. 2. There is a minimally displaced fracture of the left facet of C6 (series 11, image 33, series 9, image 58). There are additional comminuted fractures of the right facet and pedicle of C7, this level somewhat limited by motion artifact (series 12, image 60, series 11, image 34). 3.  No other fracture or static subluxation of the cervical spine. 4. Severe multilevel disc and facet degenerative  disease of the cervical spine. Electronically Signed   By: Eddie Candle M.D.   On: 08/29/2018 15:03    Cardiac Studies   No new  Patient Profile     62 y.o. 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 Jeanell Sparrow  Assessment & Plan   Principal Problem:   Syncope Active Problems:   Symptomatic advanced heart block   IDDM (insulin dependent diabetes mellitus) ([place])   HTN (hypertension)   HLD (hyperlipidemia)   Diastolic dysfunction, grade 2, EF 65-70% May 2013   Chronic renal insufficiency, stage IV (severe) - due to DM & HTN, SCr improving-1.6   Anticoagulated on Coumadin, chronically   She's demonstrating advanced conduction system disease with symptoms of nausea and previous syncope requiring hospitalization. Her  HR trend appears more bradycardic today as compared to yesterday. I believe she will need a pacemaker evaluation this admission, will review this with our colleagues in [place]. No AV nodal blocking agents on board from chart review.   She may need a temporary pacing wire given that it is a holiday today. Unfortunately she has a rigid c-spine stabilizing collar in place for conservative management of a  L C6 unilateral facet fracture, right C7 unilateral facet fracture with possible extension into the right C7 pedicle. Has been evaluated by neurosurgery and felt to be a poor operative candidate. This will limit our ability to place a temporary wire through the IJ. Will seek the assistance of EP for guidance on best options for pacing.   We are arranging transfer to ICU level care to better monitor the patient with very low heart rates.   Recommend bed rest while severely bradycardic.   HLD - continue Crestor.     For questions or updates, please contact [place] Please consult [email or web address] for contact info under        Signed, Elouise Munroe, MD  08/30/2018, 10:29 AM

## 2018-08-30 NOTE — Progress Notes (Signed)
Order received to transfer patient to ICU per cardiologist for symptomatic bradycardia requiring Pacemaker implantation. Report called to [facility] receiving Nurse Caitlyn. Pt transferred to 2H20, RRT RN accompanied. Elita Boone, BSN, RN

## 2018-08-30 NOTE — Progress Notes (Signed)
PROGRESS NOTE  ASTRID VIDES GBT:[id number] DOB: 11-Jan-1956 DOA: 08/29/2018 PCP: [name], MD   LOS: 1 day   Brief narrative: HPI: [name] is a 63 y.o. female with medical history significant of DM; DVT/PE (2014); OSA; hypothyroidism; HTN; HLD; chronic low back pain; CHF; and afib on Coumadin presenting with syncope.  She was fine this AM - got up at 5, let the dogs out, started cooking Sunday dinner.  She got a little SOB, thought maybe it was allergies.  She went to sweep and felt dizzy and next thing she knew she woke up in the floor.  She falls frequently because of joint issues.  She went outside and fell off the porch and lost consciousness.  When EMS arrived, she lost consciousness again, "had another seizure" again.  She had seizures a few months ago - would lay back in the bed and "wasn't there".  She didn't have any further episodes.  No tongue biting.  ED Course:  Bracycardia, hypokalemia, AKI - presenting after 3 syncopal episodes, unknown length of time.  With EMS, stiff with gaze and recovery within a minute or 2.  No issues in the ER.  Bradycardia, HR 30-45.  Cardiology thinks slow flutter and will consult.  Possibly medication-related.  Head and neck CT pending due to fall.  Recently started on   Subjective:  Patient complains of mild nausea but denies any chest pain palpitation dizziness or lightheadedness.  Nursing staff reported low heart rate.  She complains of pain in the neck and shoulder area.  Assessment/Plan:  Principal Problem:   Syncope Active Problems:   IDDM (insulin dependent diabetes mellitus) (HCC)   HTN (hypertension)   HLD (hyperlipidemia)   Diastolic dysfunction, grade 2, EF 65-70% May 2013   Chronic renal insufficiency, stage IV (severe) - due to DM & HTN, SCr improving-1.6   Anticoagulated on Coumadin, chronically   Symptomatic advanced heart block  Syncope likely secondary to symptomatic advanced heart block.  Patient has been seen by  cardiology today.  Plan is to transfer to ICU for pacemaker implantation.  Blood pressure is stable at this time.  Patient will need to be assessed by EP cardiology.  Question seizure-like episode.  Check EEG.  Neurochecks.  TSH of 1.7  C6 unilateral facet fracture, right C7 unilateral facet fracture with possible extension into the right C7 pedicle.  Patient was seen by neurosurgery and has been prescribed rigid cervical collar.  At this time, she has been considered a poor operative candidate.  Hyperlipidemia continue Crestor  Afib on Coumadin Now with bradycardia. Hold Coumadin for now given possible need for PPM placement and/or C-spine surgery; will transition to Heparin when needed  Stage 4 CKD -On gentle IV fluid hydration.  Hold off with lisinopril/spironolactone and Demadex for now.  Patient will need outpatient follow-up with nephrology on discharge.  Avoid nephrotoxic medication.  Daily BMP.  Chronic diastolic CHF -71/06 echo with preserved EF; 6/17 echo with grade 3 diastolic dysfunction.  Hold beta-blockers ACE inhibitor/diuretics for now.  On gentle IV fluid hydration.  Monitor for fluid overload.  DM -We will continue with Levemir, sliding scale insulin and Accu-Chek.  Diabetic diet.  Last A1c of 5 in 04/2016.  Will check A1c again  Essential hypertension PRN hydralazine for now   Hypothyroidism TSH within normal limits.  Continue Synthroid   VTE Prophylaxis: Hold Coumadin  Code Status: Full code  Family Communication: None  Disposition Plan: Home   Consultants:  Cardiology  Procedures:  None yet, plans for pacemaker placement  Antibiotics: Anti-infectives (From admission, onward)   None      Objective: Vitals:   08/30/18 0723 08/30/18 0858  BP: 135/75   Pulse: (!) 31   Resp: 14   Temp:  98.3 F (36.8 C)  SpO2: 97%     Intake/Output Summary (Last 24 hours) at 08/30/2018 1044 Last data filed at 08/30/2018 0400 Gross per 24 hour  Intake  1460 ml  Output 2900 ml  Net -1440 ml   Filed Weights   08/29/18 1122 08/30/18 0605  Weight: 114.3 kg 112.8 kg   Body mass index is 45.48 kg/m.   Physical Exam: GENERAL: Patient is alert awake and oriented. Mild distress due to pain, morbidly obese.  Hard cervical collar in place. HENT: No scleral pallor or icterus. Pupils equally reactive to light. Oral mucosa is moist NECK: is supple, no palpable thyroid enlargement. CHEST: Clear to auscultation. No crackles or wheezes. Non tender on palpation. Diminished breath sounds bilaterally. CVS: S1 and S2 heard, bradycardic.  No murmur.  No pericardial rub. ABDOMEN: Soft, non-tender, bowel sounds are present. No palpable hepato-splenomegaly. EXTREMITIES: No edema. CNS: Cranial nerves are intact.Moves all extremities. SKIN: warm and dry, bilateral wounds on the shins with dressing.  Excoriations and abrasions from fall.  Data Review: I have personally reviewed the following laboratory data and studies,  CBC: Recent Labs  Lab 08/29/18 1248 08/30/18 0319  WBC 7.5 7.0  NEUTROABS 6.3  --   HGB 10.7* 9.5*  HCT 34.0* 29.7*  MCV 101.5* 100.0  PLT 140* 212*   Basic Metabolic Panel: Recent Labs  Lab 08/29/18 1248 08/30/18 0319  NA 135 138  K 5.6* 5.1  CL 103 107  CO2 22 23  GLUCOSE 174* 145*  BUN 64* 61*  CREATININE 2.24* 2.23*  CALCIUM 9.1 8.7*   Liver Function Tests: Recent Labs  Lab 08/29/18 1248  AST 39  ALT 27  ALKPHOS 55  BILITOT 0.9  PROT 6.3*  ALBUMIN 3.9   No results for input(s): LIPASE, AMYLASE in the last 168 hours. No results for input(s): AMMONIA in the last 168 hours. Cardiac Enzymes: No results for input(s): CKTOTAL, CKMB, CKMBINDEX, TROPONINI in the last 168 hours. BNP (last 3 results) No results for input(s): BNP in the last 8760 hours.  ProBNP (last 3 results) No results for input(s): PROBNP in the last 8760 hours.  CBG: Recent Labs  Lab 08/29/18 1124 08/29/18 1824 08/29/18 2136  08/30/18 0819  GLUCAP 167* 138* 179* 141*   Recent Results (from the past 240 hour(s))  SARS Coronavirus 2 (CEPHEID - Performed in [place] hospital lab), Hosp Order     Status: None   Collection Time: 08/29/18  1:12 PM  Result Value Ref Range Status   SARS Coronavirus 2 NEGATIVE NEGATIVE Final    Comment: (NOTE) If result is NEGATIVE SARS-CoV-2 target nucleic acids are NOT DETECTED. The SARS-CoV-2 RNA is generally detectable in upper and lower  respiratory specimens during the acute phase of infection. The lowest  concentration of SARS-CoV-2 viral copies this assay can detect is 250  copies / mL. A negative result does not preclude SARS-CoV-2 infection  and should not be used as the sole basis for treatment or other  patient management decisions.  A negative result may occur with  improper specimen collection / handling, submission of specimen other  than nasopharyngeal swab, presence of viral mutation(s) within the  areas targeted by this assay, and inadequate  number of viral copies  (<250 copies / mL). A negative result must be combined with clinical  observations, patient history, and epidemiological information. If result is POSITIVE SARS-CoV-2 target nucleic acids are DETECTED. The SARS-CoV-2 RNA is generally detectable in upper and lower  respiratory specimens dur ing the acute phase of infection.  Positive  results are indicative of active infection with SARS-CoV-2.  Clinical  correlation with patient history and other diagnostic information is  necessary to determine patient infection status.  Positive results do  not rule out bacterial infection or co-infection with other viruses. If result is PRESUMPTIVE POSTIVE SARS-CoV-2 nucleic acids MAY BE PRESENT.   A presumptive positive result was obtained on the submitted specimen  and confirmed on repeat testing.  While 2019 novel coronavirus  (SARS-CoV-2) nucleic acids may be present in the submitted sample  additional  confirmatory testing may be necessary for epidemiological  and / or clinical management purposes  to differentiate between  SARS-CoV-2 and other Sarbecovirus currently known to infect humans.  If clinically indicated additional testing with an alternate test  methodology [phone]) is advised. The SARS-CoV-2 RNA is generally  detectable in upper and lower respiratory sp ecimens during the acute  phase of infection. The expected result is Negative. Fact Sheet for Patients:  StrictlyIdeas.no Fact Sheet for Healthcare Providers: BankingDealers.co.za This test is not yet approved or cleared by the [place] FDA and has been authorized for detection and/or diagnosis of SARS-CoV-2 by FDA under an Emergency Use Authorization (EUA).  This EUA will remain in effect (meaning this test can be used) for the duration of the COVID-19 declaration under Section 564(b)(1) of the Act, 21 U.S.C. section 360bbb-3(b)(1), unless the authorization is terminated or revoked sooner. Performed at [facility], [place] [address], [place], [place] [address]      Studies: Ct Head Wo Contrast  Result Date: 08/29/2018 CLINICAL DATA:  Seizure, unwitnessed fall EXAM: CT HEAD WITHOUT CONTRAST CT CERVICAL SPINE WITHOUT CONTRAST TECHNIQUE: Multidetector CT imaging of the head and cervical spine was performed following the standard protocol without intravenous contrast. Multiplanar CT image reconstructions of the cervical spine were also generated. COMPARISON:  09/08/2012 FINDINGS: CT HEAD FINDINGS Brain: No evidence of acute infarction, hemorrhage, hydrocephalus, extra-axial collection or mass lesion/mass effect. Mild periventricular white matter hypodensity. Vascular: No hyperdense vessel or unexpected calcification. Skull: Normal. Negative for fracture or focal lesion. Sinuses/Orbits: No acute finding. Other: None. CT CERVICAL SPINE FINDINGS Alignment: Normal. Skull base  and vertebrae: There is a minimally displaced fracture of the left facet of C6 (series 11, image 33, series 9, image 58). There are additional comminuted fractures of the right facet and pedicle of C7, this level somewhat limited by motion artifact (series 12, image 60, series 11, image 34). No primary bone lesion or focal pathologic process. Soft tissues and spinal canal: No prevertebral fluid or swelling. No visible canal hematoma. Disc levels: There is severe multilevel disc and facet degenerative disease, worst from C3 through C7. Upper chest: Negative. Other: None. IMPRESSION: 1. No acute intracranial pathology. Small-vessel white matter disease. 2. There is a minimally displaced fracture of the left facet of C6 (series 11, image 33, series 9, image 58). There are additional comminuted fractures of the right facet and pedicle of C7, this level somewhat limited by motion artifact (series 12, image 60, series 11, image 34). 3.  No other fracture or static subluxation of the cervical spine. 4. Severe multilevel disc and facet degenerative disease of the  cervical spine. Electronically Signed   By: Eddie Candle M.D.   On: 08/29/2018 15:03   Ct Cervical Spine Wo Contrast  Result Date: 08/29/2018 CLINICAL DATA:  Seizure, unwitnessed fall EXAM: CT HEAD WITHOUT CONTRAST CT CERVICAL SPINE WITHOUT CONTRAST TECHNIQUE: Multidetector CT imaging of the head and cervical spine was performed following the standard protocol without intravenous contrast. Multiplanar CT image reconstructions of the cervical spine were also generated. COMPARISON:  09/08/2012 FINDINGS: CT HEAD FINDINGS Brain: No evidence of acute infarction, hemorrhage, hydrocephalus, extra-axial collection or mass lesion/mass effect. Mild periventricular white matter hypodensity. Vascular: No hyperdense vessel or unexpected calcification. Skull: Normal. Negative for fracture or focal lesion. Sinuses/Orbits: No acute finding. Other: None. CT CERVICAL SPINE  FINDINGS Alignment: Normal. Skull base and vertebrae: There is a minimally displaced fracture of the left facet of C6 (series 11, image 33, series 9, image 58). There are additional comminuted fractures of the right facet and pedicle of C7, this level somewhat limited by motion artifact (series 12, image 60, series 11, image 34). No primary bone lesion or focal pathologic process. Soft tissues and spinal canal: No prevertebral fluid or swelling. No visible canal hematoma. Disc levels: There is severe multilevel disc and facet degenerative disease, worst from C3 through C7. Upper chest: Negative. Other: None. IMPRESSION: 1. No acute intracranial pathology. Small-vessel white matter disease. 2. There is a minimally displaced fracture of the left facet of C6 (series 11, image 33, series 9, image 58). There are additional comminuted fractures of the right facet and pedicle of C7, this level somewhat limited by motion artifact (series 12, image 60, series 11, image 34). 3.  No other fracture or static subluxation of the cervical spine. 4. Severe multilevel disc and facet degenerative disease of the cervical spine. Electronically Signed   By: Eddie Candle M.D.   On: 08/29/2018 15:03    Scheduled Meds:  atropine       allopurinol  300 mg Oral Daily   Chlorhexidine Gluconate Cloth  6 each Topical Daily   citalopram  20 mg Oral QHS   insulin aspart  0-5 Units Subcutaneous QHS   insulin aspart  0-9 Units Subcutaneous TID WC   [START ON 08/31/2018] insulin detemir  16 Units Subcutaneous QAC breakfast   isosorbide mononitrate  120 mg Oral Daily   levothyroxine  50 mcg Oral Q0600   Melatonin  6 mg Oral QHS   mupirocin cream   Topical BID   rosuvastatin  10 mg Oral Daily   sodium chloride flush  3 mL Intravenous Q12H    Continuous Infusions:  lactated ringers 75 mL/hr at 08/30/18 6283     Flora Lipps, MD  [facility] 08/30/2018

## 2018-08-30 NOTE — Progress Notes (Signed)
Patient was only able to go a few drops in the bedpan, bladder scanned for 816cc, text paged [facility] for cath orders.

## 2018-08-30 NOTE — Consult Note (Signed)
[facility] Nurse wound consult note Patient receiving care in [place]. Reason for Consult: LE wounds Wound type: small, scattered partial thickness from chronic venous stasis.  Currently covered with small foam dressings.  Patient states she normally applies her own unna boots bilaterally. Also, to the left plantar surface of the 5th toe, she has a laceration from one of her falls yesterday.  Wound bed: The laceration is approximated. Drainage (amount, consistency, odor) No active drainage noted from any of the areas, no odor. Periwound: BLEs darkened with hemosiderin deposits. Dressing procedure/placement/frequency: For the toe laceration: Mupirocin cream, cover with small foam dressing.  For the leg wounds, continue the foam dressings.  Change every 3 days and prn.  Consider ordering application of unna boots before discharge.  Right now her heart rate is in the low 30s and upper 20s, still conversing and mentating: she will not be getting up any time soon.  I notified her primary RN, )Paulette of the HR and orders. Monitor the wound area(s) for worsening of condition such as: Signs/symptoms of infection,  Increase in size,  Development of or worsening of odor, Development of pain, or increased pain at the affected locations.  Notify the medical team if any of these develop.  Thank you for the consult.  Discussed plan of care with the patient and bedside nurse.  [facility] nurse will not follow at this time.  Please re-consult the [facility] team if needed.  Val Riles, RN, MSN, CWOCN, CNS-BC, pager [phone] .

## 2018-08-30 NOTE — Progress Notes (Signed)
[facility] for Heparin (when INR<2) Indication: atrial fibrillation  Allergies  Allergen Reactions  . Morphine And Related Rash    Patient Measurements: Height: 5\' 2"  (157.5 cm) Weight: 252 lb (114.3 kg) IBW/kg (Calculated) : 50.1 Heparin Dosing Weight: 78 kg  Vital Signs: Temp: 99.7 F (37.6 C) (05/25 0036) Temp Source: Oral (05/25 0036) BP: 139/53 (05/25 0036) Pulse Rate: 33 (05/25 0036)  Labs: Recent Labs    08/29/18 1248 08/30/18 0319  HGB 10.7* 9.5*  HCT 34.0* 29.7*  PLT 140* PENDING  LABPROT 28.6* 29.1*  INR 2.7* 2.8*  CREATININE 2.24*  --     Estimated Creatinine Clearance: 31.2 mL/min (A) (by C-G formula based on SCr of 2.24 mg/dL (H)).   Medical History: Past Medical History:  Diagnosis Date  . A-fib ([place])   . Anemia   . Anxiety   . Arthritis    "right hip; both knees; left wrist/shoulder; back" (01/19/2013"  . Bleeding on Coumadin 08/2012; 01/18/2013   [facility] admissions (01/19/2013)  . CHF (congestive heart failure) ([place])    "2-3 times" (01/19/2013)  . Chronic lower back pain   . Depression   . DVT (deep venous thrombosis) ([place] [place]) 10 years ago   numerous/notes 01/18/2013  . GERD (gastroesophageal reflux disease)   . Gout   . ONGEXBMW(413.2)    "maybe weekly" (01/19/2013)  . Heart murmur   . High cholesterol    "been off RX for this at one time" (01/18/2013)  . History of blood transfusion 1983; 04/2012   "3 w/ childbirth; hospitalized for pain" (01/19/2013)  . Hypertension   . Hypothyroidism   . Migraines    "twice/yr maybe" (01/19/2013)  . OSA (obstructive sleep apnea)    "sent me for test in 04/2012; never ordered mask, etc" (01/19/2013)  . PE (pulmonary thromboembolism) ([facility]) 3 years ag0   3/notes 01/18/2013  . Pneumonia before 2011   "once' (01/18/2013)  . Renal disorder    kindey function low; "Metformin was destroying my kidneys" (01/19/2013)  . Shortness of breath    "only related to my CHF"  (01/18/2013)  . Swelling of hand 08/31/2014   RT HAND  . Type II diabetes mellitus ([place])   . UTI (urinary tract infection) 08/31/2014    Assessment: 33 YOF who [facility] with syncope/seizures. The patient was on warfarin PTA for hx Afib - being held in case procedure are needed. Pharmacy consulted to start Heparin when INR<2.  Admit INR 2.7 on PTA dose. Last dose documented on 5/23 at 1600. INR today rose to 2.8. Hgb 9.5, plt pending. No documented s/sx of bleeding.   PTA regimen 5 mg daily except 7.5 mg on Sun/Tues/Thurs.   Goal of Therapy:  Heparin level 0.3-0.7 units/ml Monitor platelets by anticoagulation protocol: Yes   Plan:  - Hold Heparin drip for now - Will plan to initiate when INR<2 - Will f/u with INR in the AM  Thank you for allowing pharmacy to be a part of this patient's care.  Antonietta Jewel, PharmD, BCCCP Clinical Pharmacist  Pager: [phone] Phone: [phone] 08/30/2018 5:19 AM   **Pharmacist phone directory can now be found on [email or web address] (PW [email or web address]).  Listed under [facility].

## 2018-08-30 NOTE — Procedures (Signed)
  Leah A. Merlene Laughter, MD     [email or web address]           HISTORY: This is a 63 year old who presents with recurrent episodes of syncope associated with convulsions worrisome for seizures.  MEDICATIONS:  Current Facility-Administered Medications:  .  acetaminophen (TYLENOL) tablet 650 mg, 650 mg, Oral, Q6H PRN, 650 mg at 08/30/18 1100 **OR** acetaminophen (TYLENOL) suppository 650 mg, 650 mg, Rectal, Q6H PRN, Karmen Bongo, MD .  allopurinol (ZYLOPRIM) tablet 300 mg, 300 mg, Oral, Daily, Karmen Bongo, MD, 300 mg at 08/30/18 1118 .  atropine 1 MG/10ML injection, , , , , Stopped at 08/30/18 1225 .  Chlorhexidine Gluconate Cloth 2 % PADS 6 each, 6 each, Topical, Daily, Pokhrel, Laxman, MD, 6 each at 08/30/18 1100 .  citalopram (CELEXA) tablet 20 mg, 20 mg, Oral, QHS, Karmen Bongo, MD, 20 mg at 08/29/18 2134 .  hydrALAZINE (APRESOLINE) injection 5 mg, 5 mg, Intravenous, Q4H PRN, Karmen Bongo, MD .  insulin aspart (novoLOG) injection 0-5 Units, 0-5 Units, Subcutaneous, QHS, Karmen Bongo, MD .  insulin aspart (novoLOG) injection 0-9 Units, 0-9 Units, Subcutaneous, TID WC, Karmen Bongo, MD, 1 Units at 08/30/18 1231 .  [START ON 08/31/2018] insulin detemir (LEVEMIR) injection 16 Units, 16 Units, Subcutaneous, QAC breakfast, Pokhrel, Laxman, MD .  isosorbide mononitrate (IMDUR) 24 hr tablet 120 mg, 120 mg, Oral, Daily, Karmen Bongo, MD, 120 mg at 08/30/18 1119 .  lactated ringers infusion, , Intravenous, Continuous, Karmen Bongo, MD, Last Rate: 75 mL/hr at 08/30/18 0721 .  levothyroxine (SYNTHROID) tablet 50 mcg, 50 mcg, Oral, Q0600, Karmen Bongo, MD, 50 mcg at 08/30/18 0547 .  Melatonin TABS 6 mg, 6 mg, Oral, QHS, Karmen Bongo, MD, 6 mg at 08/29/18 2250 .  mupirocin cream (BACTROBAN) 2 %, , Topical, BID, Pokhrel, Laxman, MD .  nitroGLYCERIN (NITROSTAT) SL tablet 0.4 mg, 0.4 mg, Sublingual, Q5 min PRN, Evans Lance, MD, 0.4 mg at 08/30/18 1117  .  ondansetron (ZOFRAN) tablet 4 mg, 4 mg, Oral, Q6H PRN **OR** ondansetron (ZOFRAN) injection 4 mg, 4 mg, Intravenous, Q6H PRN, Karmen Bongo, MD, 4 mg at 08/30/18 0720 .  oxyCODONE-acetaminophen (PERCOCET/ROXICET) 5-325 MG per tablet 1-2 tablet, 1-2 tablet, Oral, Q6H PRN, Karmen Bongo, MD, 1 tablet at 08/29/18 2135 .  rosuvastatin (CRESTOR) tablet 10 mg, 10 mg, Oral, q1800, Pokhrel, Laxman, MD .  sodium chloride flush (NS) 0.9 % injection 3 mL, 3 mL, Intravenous, Q12H, Karmen Bongo, MD, 3 mL at 08/30/18 1132     ANALYSIS: A 16 channel recording using standard 10 20 measurements is conducted for 21 minutes.  There is a well-formed posterior dominant rhythm of 10.5-11 Hz which attenuates with eye opening.  There is beta activity observed in the frontal areas.  Awake and drowsy architecture is documented.  Photic stimulation and hyperventilation are not conducted.  There is no focal or lateralized slowing.  There is no epileptiform activity is observed.   IMPRESSION: 1.  This is a normal recording of awake and drowsy states.      Keevin Panebianco A. Merlene [name], M.D.  Diplomate, Tax adviser of Psychiatry and Neurology ( Neurology).

## 2018-08-30 NOTE — Progress Notes (Signed)
Text paged again for cath orders since patient is now becoming extremely uncomfortable, last In/Out was at 4pm and they got 1100cc out. Patient has LR at 75cc/hr, will continue to monitor.

## 2018-08-30 NOTE — Progress Notes (Signed)
Patient C/O feeling like she has to void, will attempt a bedpan and continue to monitor.

## 2018-08-30 NOTE — Progress Notes (Addendum)
   CTSP for chest pain. Patient has history of this intermittently in her back, shoulder blades and jaw. This prompted cath in 2016 showing no significant CAD. Episode this AM lasted about 10 mins, eased with Tylenol. Now subsided. No other acute symptoms. Pain was not associated with any acute VS change or pauses - continues to be persistently bradycardic in the 30s-40s similar to when chest pain free. Dr. Lovena Le had arrived at bedside to see patient for EP consult. He advised giving 1 SL NTG and checking troponins. EKG shows continued atrial flutter with slow ventricular response with nonspecific TW changes. V4-V6 TWs look a flatter than prior, but no ST elevation. Ddx includes ?neuropathic pain vs MSK vs coronary vasospasm. Dr. Lovena Le recommends holding warfarin and is contemplating temp wire placement as PPM would be made tricky by c-collar and neck injury. Warfarin has not been re-ordered (she has heparin per pharmacy ordered to begin when INR <2, with last INR 2.8). He does not anticipate any acute ischemic eval this admission unless there is significant troponin bump. Pt advised to notify for any recurrrent CP. Dayna Dunn PA-C  Addendum: troponin 0.18. D/w Dr. Lovena Le. This value is similar to her prior troponin leak in the past. Pt remains CP free. No acute change in plan. Continue to monitor and cycle to trend. He feels pt can have clear liquids for now and be NPO after midnight. Dayna Dunn PA-C @ 1:04pm

## 2018-08-30 NOTE — Progress Notes (Signed)
CRITICAL VALUE ALERT  Critical Value:  Troponin  Date & Time Notified:  08/30/2018 @ 1300  Provider Notified: PA Dunn  Orders Received/Actions taken: Continue to monitor.

## 2018-08-30 NOTE — Consult Note (Signed)
Cardiology Consultation:   Patient ID: [name] MRN: [id number]; DOB: 24-Aug-1955  Admit date: 08/29/2018 Date of Consult: 08/30/2018  Primary Care Provider: [name], MD Primary Cardiologist: No primary care provider on file.  Primary Electrophysiologist:  None [name]   Patient Profile:   [name] is a 63 y.o. female with a hx of HTN and renal failure who is being seen today for the evaluation of syncope at the request of Dr. Margaretann [name].  History of Present Illness:   [name] has a h/o chronic diastolic heart failure, atrial flutter, bilatral DVT's, and pulmonary embolism. She known conduction system disease with atypical flutter and a controlled VR in the low fifties. She had not had syncope. She was admitted after experiencing a seizure. She was noted to have seizure activity falling off the porch. In the hospital, she has been noted to have long pauses and is on no AV nodal blocking drugs. Unfortunately her seizure resulted in a broken neck and she requires a cervical collar.   Past Medical History:  Diagnosis Date   A-fib ([place])    Anemia    Anxiety    Arthritis    "right hip; both knees; left wrist/shoulder; back" (01/19/2013"   Bleeding on Coumadin 08/2012; 01/18/2013   [facility] admissions (01/19/2013)   CHF (congestive heart failure) ([place])    "2-3 times" (01/19/2013)   Chronic lower back pain    Depression    DVT (deep venous thrombosis) ([facility]) 10 years ago   numerous/notes 01/18/2013   GERD (gastroesophageal reflux disease)    Gout    Headache(784.0)    "maybe weekly" (01/19/2013)   Heart murmur    High cholesterol    "been off RX for this at one time" (01/18/2013)   History of blood transfusion 1983; 04/2012   "3 w/ childbirth; hospitalized for pain" (01/19/2013)   Hypertension    Hypothyroidism    Migraines    "twice/yr maybe" (01/19/2013)   OSA (obstructive sleep apnea)    "sent me for test in 04/2012; never ordered mask,  etc" (01/19/2013)   PE (pulmonary thromboembolism) ([place]) 3 years ag0   3/notes 01/18/2013   Pneumonia before 2011   "once' (01/18/2013)   Renal disorder    kindey function low; "Metformin was destroying my kidneys" (01/19/2013)   Shortness of breath    "only related to my CHF" (01/18/2013)   Swelling of hand 08/31/2014   RT HAND   Type II diabetes mellitus ([place])    UTI (urinary tract infection) 08/31/2014    Past Surgical History:  Procedure Laterality Date   CARDIAC CATHETERIZATION N/A 03/13/2015   Procedure: Right/Left Heart Cath and Coronary Angiography;  Surgeon: Adrian Prows, MD;  Location: [facility];  Service: Cardiovascular;  Laterality: N/A;   CATARACT EXTRACTION W/ INTRAOCULAR LENS  IMPLANT, BILATERAL Bilateral 2006-2011   CESAREAN SECTION  1983   CHOLECYSTECTOMY  ~ 2002   COLONOSCOPY N/A 01/21/2013   Procedure: COLONOSCOPY;  Surgeon: Beryle Beams, MD;  Location: [facility];  Service: Endoscopy;  Laterality: N/A;   EYE SURGERY Bilateral    "multiple" (01/18/2013)   PARS PLANA REPAIR OF RETINAL DEATACHMENT Right    PARS PLANA VITRECTOMY Bilateral 2004-2006   "several" (01/18/2013)   REFRACTIVE SURGERY Bilateral    "for stigmatism" (01/18/2013)   REFRACTIVE SURGERY Left ~ 11/2012   "to puff it up cause vision got hazy" (01/18/2013)   [place]  2011?     Home Medications:  Prior to Admission medications   Medication Sig Start Date End Date Taking? Authorizing Provider  acetaminophen (TYLENOL) 500 MG tablet Take 500 mg by mouth every 6 (six) hours as needed for fever or headache (pain).   Yes [provider]  allopurinol (ZYLOPRIM) 300 MG tablet Take 300 mg by mouth daily.   Yes [provider]  aspirin EC 81 MG tablet Take 81 mg by mouth daily.   Yes [provider]  Biotin 1000 MCG tablet Take 1,000 mcg by mouth daily with lunch.    Yes [provider]  Calcium Citrate-Vitamin D  (CITRACAL/VITAMIN D PO) Take 2 tablets by mouth every evening.   Yes [provider]  citalopram (CELEXA) 20 MG tablet Take 20 mg by mouth at bedtime.   Yes [provider]  diazepam (VALIUM) 5 MG tablet Take 5 mg by mouth at bedtime as needed (back spasms).   Yes [provider]  ferrous sulfate 325 (65 FE) MG tablet Take 325 mg by mouth daily.   Yes [provider]  hydrALAZINE (APRESOLINE) 25 MG tablet Take 25 mg by mouth 3 (three) times daily. Morning, mid afternoon and bedtime   Yes [provider]  HYDROcodone-acetaminophen (NORCO/VICODIN) 5-325 MG tablet Take 1 tablet by mouth every 6 (six) hours as needed for up to 15 doses. Patient taking differently: Take 1 tablet by mouth every 6 (six) hours as needed (pain).  06/19/18  Yes Curatolo, Adam, DO  insulin aspart (NOVOLOG) 100 UNIT/ML injection Inject 2 Units into the skin 3 (three) times daily with meals. Patient taking differently: Inject 6 Units into the skin 3 (three) times daily as needed for high blood sugar (CBG >160).  09/04/14  Yes Velvet Bathe, MD  Insulin Detemir (LEVEMIR FLEXTOUCH) 100 UNIT/ML Pen Inject 16 Units into the skin daily before breakfast.    Yes [provider]  isosorbide mononitrate (IMDUR) 120 MG 24 hr tablet Take 120 mg by mouth daily.   Yes [provider]  levothyroxine (SYNTHROID) 75 MCG tablet Take 75 mcg by mouth daily before breakfast.    Yes [provider]  lisinopril (ZESTRIL) 10 MG tablet Take 10 mg by mouth daily.   Yes [provider]  Melatonin 5 MG TABS Take 5 mg by mouth at bedtime.   Yes [provider]  Multiple Vitamin (MULITIVITAMIN WITH MINERALS) TABS Take 1 tablet by mouth daily.   Yes [provider]  Omega-3 Fatty Acids (FISH OIL) 1000 MG CAPS Take 1,000 mg by mouth daily.    Yes [provider]  rosuvastatin (CRESTOR) 10 MG tablet Take 1 tablet (10 mg total) by mouth daily. Patient  taking differently: Take 10 mg by mouth at bedtime.  05/09/16  Yes Rogue Bussing, MD  spironolactone (ALDACTONE) 25 MG tablet Take 25 mg by mouth daily.   Yes [provider]  torsemide (DEMADEX) 20 MG tablet Take 2 tablets (40 mg total) by mouth daily. 05/09/16  Yes Rogue Bussing, MD  warfarin (COUMADIN) 2.5 MG tablet TAKE 7.5 MG ON M-W-F AND 5 MG TUES, THURS, SAT AND SUN AS DIRECTED Patient taking differently: Take 5-7.5 mg by mouth See admin instructions. Take 2 tablets (5 mg) by mouth on Monday,Wednesday, Friday, Saturday at 4pm, take 3 tablets (7.5 mg) on Sunday, Tuesday, Thursday at 4pm 02/10/16  Yes Livesay, Tamala Julian, MD    Inpatient Medications: Scheduled Meds:  allopurinol  300 mg Oral Daily   atropine  Chlorhexidine Gluconate Cloth  6 each Topical Daily   citalopram  20 mg Oral QHS   insulin aspart  0-5 Units Subcutaneous QHS   insulin aspart  0-9 Units Subcutaneous TID WC   [START ON 08/31/2018] insulin detemir  16 Units Subcutaneous QAC breakfast   isosorbide mononitrate  120 mg Oral Daily   levothyroxine  50 mcg Oral Q0600   Melatonin  6 mg Oral QHS   mupirocin cream   Topical BID   rosuvastatin  10 mg Oral q1800   sodium chloride flush  3 mL Intravenous Q12H   Continuous Infusions:  lactated ringers 75 mL/hr at 08/30/18 0721   PRN Meds: acetaminophen **OR** acetaminophen, hydrALAZINE, nitroGLYCERIN, ondansetron **OR** ondansetron (ZOFRAN) IV, oxyCODONE-acetaminophen  Allergies:    Allergies  Allergen Reactions   Morphine And Related Rash    Social History:   Social History   Socioeconomic History   Marital status: Single    Spouse name: Not on file   Number of children: Not on file   Years of education: Not on file   Highest education level: Not on file  Occupational History   Occupation: disabled  Social Designer, fashion/clothing strain: Not on file   Food insecurity:    Worry: Not on file     Inability: Not on file   Transportation needs:    Medical: Not on file    Non-medical: Not on file  Tobacco Use   Smoking status: Former Smoker    Packs/day: 0.05    Years: 30.00    Pack years: 1.50    Types: Cigarettes    Last attempt to quit: 1990    Years since quitting: 30.4   Smokeless tobacco: Never Used   Tobacco comment: 01/19/2013 "quit smoking cigarettes in the early '90's"  Substance and Sexual Activity   Alcohol use: Not Currently    Comment: rare now but never a heavy drinker   Drug use: No   Sexual activity: Never  Lifestyle   Physical activity:    Days per week: Not on file    Minutes per session: Not on file   Stress: Not on file  Relationships   Social connections:    Talks on phone: Not on file    Gets together: Not on file    Attends religious service: Not on file    Active member of club or organization: Not on file    Attends meetings of clubs or organizations: Not on file    Relationship status: Not on file   Intimate partner violence:    Fear of current or ex partner: Not on file    Emotionally abused: Not on file    Physically abused: Not on file    Forced sexual activity: Not on file  Other Topics Concern   Not on file  Social History Narrative   Not on file    Family History:    Family History  Problem Relation Age of Onset   Cerebral aneurysm Mother    Hypertension Father    Cerebral aneurysm Maternal Grandfather    Cerebral aneurysm Maternal Aunt    Cancer Maternal Uncle      ROS:  Please see the history of present illness.   All other ROS reviewed and negative.     Physical Exam/Data:   Vitals:   08/30/18 0858 08/30/18 1100 08/30/18 1200 08/30/18 1202  BP:  (!) 136/38  (!) 138/42  Pulse:  (!) 37 (!) 38 (!) 42  Resp:  15 (!) 0 14  Temp: 98.3 F (36.8 C)     TempSrc: Oral     SpO2:  99% 95% 95%  Weight:      Height:        Intake/Output Summary (Last 24 hours) at 08/30/2018 1344 Last data filed at  08/30/2018 1200 Gross per 24 hour  Intake 1460 ml  Output 3350 ml  Net -1890 ml   Last 3 Weights 08/30/2018 08/29/2018 06/19/2018  Weight (lbs) 248 lb 10.9 oz 252 lb 257 lb  Weight (kg) 112.8 kg 114.306 kg 116.574 kg     Body mass index is 45.48 kg/m.  General:  Well nourished, well developed, in no acute distress with EEG electrodes in place. HEENT: normal Lymph: no adenopathy Neck: unable to assess JVD Endocrine:  No thryomegaly Vascular: No carotid bruits; FA pulses 2+ bilaterally without bruits  Cardiac:  Irregular brady Lungs:  No increased work of breathing Abd: non-distended Ext: trace edema Musculoskeletal:  No deformities, BUE and BLE strength normal and equal Skin: warm and dry  Neuro:  CNs 2-12 intact, no focal abnormalities noted Psych:  Normal affect   EKG:  The EKG was personally reviewed and demonstrates:  Atrial fib/flutter with a slow vr Telemetry:  Telemetry was personally reviewed and demonstrates:  Atrial fib/flutter with a very slow VR.  Relevant CV Studies: none  Laboratory Data:  Chemistry Recent Labs  Lab 08/29/18 1248 08/30/18 0319  NA 135 138  K 5.6* 5.1  CL 103 107  CO2 22 23  GLUCOSE 174* 145*  BUN 64* 61*  CREATININE 2.24* 2.23*  CALCIUM 9.1 8.7*  GFRNONAA 23* 23*  GFRAA 26* 27*  ANIONGAP 10 8    Recent Labs  Lab 08/29/18 1248  PROT 6.3*  ALBUMIN 3.9  AST 39  ALT 27  ALKPHOS 55  BILITOT 0.9   Hematology Recent Labs  Lab 08/29/18 1248 08/30/18 0319  WBC 7.5 7.0  RBC 3.35* 2.97*  HGB 10.7* 9.5*  HCT 34.0* 29.7*  MCV 101.5* 100.0  MCH 31.9 32.0  MCHC 31.5 32.0  RDW 14.9 15.1  PLT 140* 117*   Cardiac Enzymes Recent Labs  Lab 08/30/18 1138  TROPONINI 0.18*   No results for input(s): TROPIPOC in the last 168 hours.  BNPNo results for input(s): BNP, PROBNP in the last 168 hours.  DDimer No results for input(s): DDIMER in the last 168 hours.  Radiology/Studies:  Ct Head Wo Contrast  Result Date:  08/29/2018 CLINICAL DATA:  Seizure, unwitnessed fall EXAM: CT HEAD WITHOUT CONTRAST CT CERVICAL SPINE WITHOUT CONTRAST TECHNIQUE: Multidetector CT imaging of the head and cervical spine was performed following the standard protocol without intravenous contrast. Multiplanar CT image reconstructions of the cervical spine were also generated. COMPARISON:  09/08/2012 FINDINGS: CT HEAD FINDINGS Brain: No evidence of acute infarction, hemorrhage, hydrocephalus, extra-axial collection or mass lesion/mass effect. Mild periventricular white matter hypodensity. Vascular: No hyperdense vessel or unexpected calcification. Skull: Normal. Negative for fracture or focal lesion. Sinuses/Orbits: No acute finding. Other: None. CT CERVICAL SPINE FINDINGS Alignment: Normal. Skull base and vertebrae: There is a minimally displaced fracture of the left facet of C6 (series 11, image 33, series 9, image 58). There are additional comminuted fractures of the right facet and pedicle of C7, this level somewhat limited by motion artifact (series 12, image 60, series 11, image 34). No primary bone lesion or focal pathologic process. Soft tissues and spinal canal: No prevertebral fluid or swelling. No visible canal  hematoma. Disc levels: There is severe multilevel disc and facet degenerative disease, worst from C3 through C7. Upper chest: Negative. Other: None. IMPRESSION: 1. No acute intracranial pathology. Small-vessel white matter disease. 2. There is a minimally displaced fracture of the left facet of C6 (series 11, image 33, series 9, image 58). There are additional comminuted fractures of the right facet and pedicle of C7, this level somewhat limited by motion artifact (series 12, image 60, series 11, image 34). 3.  No other fracture or static subluxation of the cervical spine. 4. Severe multilevel disc and facet degenerative disease of the cervical spine. Electronically Signed   By: Eddie Candle M.D.   On: 08/29/2018 15:03   Ct Cervical  Spine Wo Contrast  Result Date: 08/29/2018 CLINICAL DATA:  Seizure, unwitnessed fall EXAM: CT HEAD WITHOUT CONTRAST CT CERVICAL SPINE WITHOUT CONTRAST TECHNIQUE: Multidetector CT imaging of the head and cervical spine was performed following the standard protocol without intravenous contrast. Multiplanar CT image reconstructions of the cervical spine were also generated. COMPARISON:  09/08/2012 FINDINGS: CT HEAD FINDINGS Brain: No evidence of acute infarction, hemorrhage, hydrocephalus, extra-axial collection or mass lesion/mass effect. Mild periventricular white matter hypodensity. Vascular: No hyperdense vessel or unexpected calcification. Skull: Normal. Negative for fracture or focal lesion. Sinuses/Orbits: No acute finding. Other: None. CT CERVICAL SPINE FINDINGS Alignment: Normal. Skull base and vertebrae: There is a minimally displaced fracture of the left facet of C6 (series 11, image 33, series 9, image 58). There are additional comminuted fractures of the right facet and pedicle of C7, this level somewhat limited by motion artifact (series 12, image 60, series 11, image 34). No primary bone lesion or focal pathologic process. Soft tissues and spinal canal: No prevertebral fluid or swelling. No visible canal hematoma. Disc levels: There is severe multilevel disc and facet degenerative disease, worst from C3 through C7. Upper chest: Negative. Other: None. IMPRESSION: 1. No acute intracranial pathology. Small-vessel white matter disease. 2. There is a minimally displaced fracture of the left facet of C6 (series 11, image 33, series 9, image 58). There are additional comminuted fractures of the right facet and pedicle of C7, this level somewhat limited by motion artifact (series 12, image 60, series 11, image 34). 3.  No other fracture or static subluxation of the cervical spine. 4. Severe multilevel disc and facet degenerative disease of the cervical spine. Electronically Signed   By: Eddie Candle M.D.   On:  08/29/2018 15:03    Assessment and Plan:   1. CHB - the patient has transient CHB with a very slow VR. Because of her neck brace, she is at risk for infection with the brace in place during PPM insertion. She had a long pause and has been transferred to the ICU. Hopefully we can avoid a temp PM. 2. Atrial fib/flutter - she is anti-coagulated. Her rate is slow 3. Chronic renal failure, stage 4 - she will be followed.  4. Diastolic heart failure - she is not particularly volume overloaded.     For questions or updates, please contact [place] Please consult [email or web address] for contact info under   Signed, Cristopher Peru, MD  08/30/2018 1:44 PM

## 2018-08-30 NOTE — Progress Notes (Signed)
Text paged for 3rd time and notified charge nurse because patient is very uncomfortable. We then looked in the chart to see who to call next and orders were placed for foley cath. In/Out cath performed per protocol and got 650cc yellow urine and patient feels much better, will continue to monitor.

## 2018-08-30 NOTE — Progress Notes (Signed)
Patient has been unable to void utilizing bedpan or purewick.  Patient states she is incapable of voiding while laying and/or sitting in the bed.  Patient feels like she has to void and a fullness in her bladder but is unable to.  Bladder scan performed and 230 mL of urine present in bladder.   Patient has had two in and out caths performed since admission and foley catheter ordered.  Patient states she normally has no issues voiding and believes the retention is due to being on bedrest and not being able to sit on a bedside commode or toilet.  Foley catheter placed by two RNs under sterile technique after peri care performed.  RN to continue to assess need for foley catheter with plans to remove the catheter when patient is no longer on bedrest.

## 2018-08-30 NOTE — Progress Notes (Signed)
EEG completed, results pending. 

## 2018-08-30 NOTE — Progress Notes (Signed)
Pt has 24 units Levemir ordered but states she normally takes 16 units at home. Dose held and  Attending MD Dr Louanne Belton paged and notify. Awaiting response / orders.

## 2018-08-31 ENCOUNTER — Inpatient Hospital Stay (HOSPITAL_COMMUNITY)
Admission: EM | Disposition: A | Discharge status: Home Health | Payer: Self-pay | Source: Home / Self Care | Attending: Internal Medicine

## 2018-08-31 HISTORY — PX: PACEMAKER IMPLANT: EP1218

## 2018-08-31 LAB — PROTIME-INR
INR: 2.3 — ABNORMAL HIGH (ref 0.8–1.2)
Prothrombin Time: 25.1 seconds — ABNORMAL HIGH (ref 11.4–15.2)

## 2018-08-31 LAB — GLUCOSE, CAPILLARY
Glucose-Capillary: 86 mg/dL (ref 70–99)
Glucose-Capillary: 95 mg/dL (ref 70–99)
Glucose-Capillary: 108 mg/dL — ABNORMAL HIGH (ref 70–99)
Glucose-Capillary: 87 mg/dL (ref 70–99)
Glucose-Capillary: 99 mg/dL (ref 70–99)

## 2018-08-31 LAB — HEMOGLOBIN A1C
Hgb A1c MFr Bld: 6.3 % — ABNORMAL HIGH (ref 4.8–5.6)
Mean Plasma Glucose: 134 mg/dL

## 2018-08-31 LAB — TROPONIN I: Troponin I: 0.11 ng/mL (ref <0.03)

## 2018-08-31 SURGERY — PACEMAKER IMPLANT

## 2018-08-31 MED ORDER — ACETAMINOPHEN 325 MG PO TABS
325.0000 mg | ORAL_TABLET | ORAL | Status: DC | PRN
  Administered 2018-09-01: 650 mg via ORAL
  Filled 2018-08-31: qty 2

## 2018-08-31 MED ORDER — CHLORHEXIDINE GLUCONATE 4 % EX LIQD: 60.0000 mL | Freq: Once | CUTANEOUS | Status: DC

## 2018-08-31 MED ORDER — LIDOCAINE HCL 1 % IJ SOLN
INTRAMUSCULAR | Status: AC
  Filled 2018-08-31: qty 20

## 2018-08-31 MED ORDER — SODIUM CHLORIDE 0.9 % IV SOLN
80.0000 mg | INTRAVENOUS | Status: AC
  Administered 2018-08-31: 80 mg

## 2018-08-31 MED ORDER — LIDOCAINE HCL (PF) 1 % IJ SOLN
INTRAMUSCULAR | Status: DC | PRN
  Administered 2018-08-31: 50 mL
  Administered 2018-08-31: 20 mL

## 2018-08-31 MED ORDER — CHLORHEXIDINE GLUCONATE 4 % EX LIQD
60.0000 mL | Freq: Once | CUTANEOUS | Status: AC
  Administered 2018-08-31: 4 via TOPICAL
  Filled 2018-08-31: qty 60

## 2018-08-31 MED ORDER — SODIUM CHLORIDE 0.9 % IV SOLN
INTRAVENOUS | Status: DC
  Administered 2018-08-31: 10:00:00 via INTRAVENOUS

## 2018-08-31 MED ORDER — LIDOCAINE HCL 1 % IJ SOLN
INTRAMUSCULAR | Status: AC
  Filled 2018-08-31: qty 60

## 2018-08-31 MED ORDER — SODIUM CHLORIDE 0.9% FLUSH
3.0000 mL | Freq: Two times a day (BID) | INTRAVENOUS | Status: DC
  Administered 2018-09-01 – 2018-09-03 (×3): 3 mL via INTRAVENOUS

## 2018-08-31 MED ORDER — IOHEXOL 350 MG/ML SOLN
INTRAVENOUS | Status: DC | PRN
  Administered 2018-08-31: 15 mL via INTRAVENOUS

## 2018-08-31 MED ORDER — SODIUM CHLORIDE 0.9 % IV SOLN: 250.0000 mL | INTRAVENOUS | Status: DC

## 2018-08-31 MED ORDER — SODIUM CHLORIDE 0.9 % IV SOLN
250.0000 mL | INTRAVENOUS | Status: DC | PRN
  Administered 2018-08-31: 250 mL via INTRAVENOUS

## 2018-08-31 MED ORDER — HEPARIN (PORCINE) IN NACL 1000-0.9 UT/500ML-% IV SOLN
INTRAVENOUS | Status: DC | PRN
  Administered 2018-08-31: 500 mL

## 2018-08-31 MED ORDER — CEFAZOLIN SODIUM-DEXTROSE 1-4 GM/50ML-% IV SOLN
1.0000 g | Freq: Four times a day (QID) | INTRAVENOUS | Status: AC
  Administered 2018-08-31 – 2018-09-01 (×3): 1 g via INTRAVENOUS
  Filled 2018-08-31 (×3): qty 50

## 2018-08-31 MED ORDER — CEFAZOLIN SODIUM-DEXTROSE 2-4 GM/100ML-% IV SOLN
INTRAVENOUS | Status: AC
  Filled 2018-08-31: qty 100

## 2018-08-31 MED ORDER — SODIUM CHLORIDE 0.9% FLUSH: 3.0000 mL | INTRAVENOUS | Status: DC | PRN

## 2018-08-31 MED ORDER — HYDROCODONE-ACETAMINOPHEN 5-325 MG PO TABS: 1.0000 | ORAL_TABLET | ORAL | Status: DC | PRN

## 2018-08-31 MED ORDER — HEPARIN (PORCINE) IN NACL 1000-0.9 UT/500ML-% IV SOLN
INTRAVENOUS | Status: AC
  Filled 2018-08-31: qty 500

## 2018-08-31 MED ORDER — CEFAZOLIN SODIUM-DEXTROSE 2-4 GM/100ML-% IV SOLN
2.0000 g | INTRAVENOUS | Status: AC
  Administered 2018-08-31: 13:00:00 2 g via INTRAVENOUS

## 2018-08-31 MED ORDER — SODIUM CHLORIDE 0.9 % IV SOLN
INTRAVENOUS | Status: AC
  Filled 2018-08-31: qty 2

## 2018-08-31 MED ORDER — ONDANSETRON HCL 4 MG/2ML IJ SOLN: 4.0000 mg | Freq: Four times a day (QID) | INTRAMUSCULAR | Status: DC | PRN

## 2018-08-31 MED ORDER — SODIUM CHLORIDE 0.9% FLUSH: 3.0000 mL | Freq: Two times a day (BID) | INTRAVENOUS | Status: DC

## 2018-08-31 SURGICAL SUPPLY — 8 items
CABLE SURGICAL S-101-97-12 (CABLE) ×3 IMPLANT
HOVERMATT SINGLE USE (MISCELLANEOUS) ×2 IMPLANT
IPG PACE AZUR XT SR MRI W1SR01 (Pacemaker) IMPLANT
LEAD CAPSURE NOVUS 5076-58CM (Lead) ×2 IMPLANT
PACE AZURE XT SR MRI W1SR01 (Pacemaker) ×3 IMPLANT
PAD PRO RADIOLUCENT 2001M-C (PAD) ×3 IMPLANT
SHEATH CLASSIC 7F (SHEATH) ×4 IMPLANT
TRAY PACEMAKER INSERTION (PACKS) ×3 IMPLANT

## 2018-08-31 NOTE — Progress Notes (Signed)
PROGRESS NOTE  [name]:[id number] DOB: December 09, 1955 DOA: 08/29/2018 PCP: [name], MD   LOS: 2 days   Brief narrative:  [name] is a 63 y.o. female with medical history significant of DM; DVT/PE (2014); OSA; hypothyroidism; HTN; HLD; chronic low back pain; CHF; and afib on Coumadin presenting with syncope and question seizures.In the ED, she was noted to have Bradycardia, hypokalemia, AKI - presenting after 3 syncopal episodes, unknown length of time.  With EMS, stiff with gaze and recovery within a minute or 2.  No issues in the ER.  Bradycardia, HR 30-45.  Patient was seen by cardiology and cardiology was consulted for possible pacemaker placement.   Subjective:  Patient complains of  neck pain and spasm.  Denies any shortness of breath, dizziness lightheadedness.  Assessment/Plan:  Principal Problem:   Syncope Active Problems:   IDDM (insulin dependent diabetes mellitus) (HCC)   HTN (hypertension)   HLD (hyperlipidemia)   Diastolic dysfunction, grade 2, EF 65-70% May 2013   Chronic renal insufficiency, stage IV (severe) - due to DM & HTN, SCr improving-1.6   Anticoagulated on Coumadin, chronically   Symptomatic advanced heart block  Syncope likely secondary to symptomatic advanced heart block with atrial flutter.  Patient has been seen by cardiology.   Blood pressure is stable. Plan for pacer.  Seen by electrophysiology.  Question seizure-like episode.  EEG without seizures.  Neurochecks.  TSH of 1.7  Severe neck pain secondary to C6 unilateral facet fracture, right C7 unilateral facet fracture with possible extension into the right C7 pedicle. Neurosurgery on board and has been prescribed rigid cervical collar.  At this time, she has been considered a poor operative candidate.  Continue supportive care  Elevated troponins.  Cardiology on board.  Likely secondary to CKD.  Hyperlipidemia continue Crestor  Afib/flutter, history of DVT and PE on  Coumadin Now with bradycardia. , hold coumadin. INR of 2.3. TSH 1.7  Stage 4 CKD  Hold off with lisinopril/spironolactone and Demadex for now.  Patient will need outpatient follow-up with nephrology on discharge.  Avoid nephrotoxic medication.  Daily BMP.  Chronic diastolic CHF -25/85 echo with preserved EF; 6/17 echo with grade 3 diastolic dysfunction.  Hold beta-blockers ACE inhibitor/diuretics for now.  On gentle IV fluid hydration.  Monitor for fluid overload.  DM -We will continue with Levemir, sliding scale insulin and Accu-Chek.  Diabetic diet.  Last A1c of 5 in 04/2016.    Essential hypertension PRN hydralazine for now  Hypothyroidism TSH within normal limits.  Continue Synthroid  Urinary retention.  Foley catheter in place.  Due to severe bradycardia and neck injury we will continue for now.  Reassess for removal after acute issues are resolved.  VTE Prophylaxis: Hold Coumadin  Code Status: Full code  Family Communication: Spoke with the patient in detail.  Disposition Plan: Home/rehab.  PT OT to evaluate after cardiac issues are addressed.  Likely 2 to 3 days.  Consultants:  Cardiology, neurosurgery, EP cardiology  Procedures:  plans for pacemaker placement  EEG   Antibiotics: Anti-infectives (From admission, onward)   None      Objective: Vitals:   08/31/18 0600 08/31/18 0700  BP: (!) 110/45 (!) 125/48  Pulse: (!) 41 (!) 40  Resp: 17 16  Temp:    SpO2: 91% 94%    Intake/Output Summary (Last 24 hours) at 08/31/2018 0721 Last data filed at 08/31/2018 0700 Gross per 24 hour  Intake 2071.88 ml  Output 2660 ml  Net -  588.12 ml   Filed Weights   08/29/18 1122 08/30/18 0605 08/31/18 0500  Weight: 114.3 kg 112.8 kg 118.8 kg   Body mass index is 47.9 kg/m.   Physical Exam: GENERAL: Patient is alert awake and oriented. Mild distress due to pain, morbidly obese.  Hard cervical collar in place. HENT: No scleral pallor or icterus. Pupils equally  reactive to light. Oral mucosa is moist NECK: is supple, no palpable thyroid enlargement. CHEST: Clear to auscultation. No crackles or wheezes. Non tender on palpation. Diminished breath sounds bilaterally. CVS: S1 and S2 heard, bradycardic.  No murmur.  No pericardial rub. ABDOMEN: Soft, non-tender, bowel sounds are present. No palpable hepato-splenomegaly.  Foley catheter in place. EXTREMITIES: No edema. CNS: Cranial nerves are intact.Moves all extremities. SKIN: warm and dry, bilateral wounds on the shins with dressing.  Excoriations and abrasions from fall.  Data Review: I have personally reviewed the following laboratory data and studies,  CBC: Recent Labs  Lab 08/29/18 1248 08/30/18 0319  WBC 7.5 7.0  NEUTROABS 6.3  --   HGB 10.7* 9.5*  HCT 34.0* 29.7*  MCV 101.5* 100.0  PLT 140* 073*   Basic Metabolic Panel: Recent Labs  Lab 08/29/18 1248 08/30/18 0319  NA 135 138  K 5.6* 5.1  CL 103 107  CO2 22 23  GLUCOSE 174* 145*  BUN 64* 61*  CREATININE 2.24* 2.23*  CALCIUM 9.1 8.7*   Liver Function Tests: Recent Labs  Lab 08/29/18 1248  AST 39  ALT 27  ALKPHOS 55  BILITOT 0.9  PROT 6.3*  ALBUMIN 3.9   No results for input(s): LIPASE, AMYLASE in the last 168 hours. No results for input(s): AMMONIA in the last 168 hours. Cardiac Enzymes: Recent Labs  Lab 08/30/18 1138 08/30/18 1644 08/30/18 2319  TROPONINI 0.18* 0.16* 0.11*   BNP (last 3 results) No results for input(s): BNP in the last 8760 hours.  ProBNP (last 3 results) No results for input(s): PROBNP in the last 8760 hours.  CBG: Recent Labs  Lab 08/29/18 1824 08/29/18 2136 08/30/18 0819 08/30/18 1220 08/30/18 1626  GLUCAP 138* 179* 141* 126* 130*   Recent Results (from the past 240 hour(s))  SARS Coronavirus 2 (CEPHEID - Performed in [place] hospital lab), Hosp Order     Status: None   Collection Time: 08/29/18  1:12 PM  Result Value Ref Range Status   SARS Coronavirus 2 NEGATIVE  NEGATIVE Final    Comment: (NOTE) If result is NEGATIVE SARS-CoV-2 target nucleic acids are NOT DETECTED. The SARS-CoV-2 RNA is generally detectable in upper and lower  respiratory specimens during the acute phase of infection. The lowest  concentration of SARS-CoV-2 viral copies this assay can detect is 250  copies / mL. A negative result does not preclude SARS-CoV-2 infection  and should not be used as the sole basis for treatment or other  patient management decisions.  A negative result may occur with  improper specimen collection / handling, submission of specimen other  than nasopharyngeal swab, presence of viral mutation(s) within the  areas targeted by this assay, and inadequate number of viral copies  (<250 copies / mL). A negative result must be combined with clinical  observations, patient history, and epidemiological information. If result is POSITIVE SARS-CoV-2 target nucleic acids are DETECTED. The SARS-CoV-2 RNA is generally detectable in upper and lower  respiratory specimens dur ing the acute phase of infection.  Positive  results are indicative of active infection with SARS-CoV-2.  Clinical  correlation  with patient history and other diagnostic information is  necessary to determine patient infection status.  Positive results do  not rule out bacterial infection or co-infection with other viruses. If result is PRESUMPTIVE POSTIVE SARS-CoV-2 nucleic acids MAY BE PRESENT.   A presumptive positive result was obtained on the submitted specimen  and confirmed on repeat testing.  While 2019 novel coronavirus  (SARS-CoV-2) nucleic acids may be present in the submitted sample  additional confirmatory testing may be necessary for epidemiological  and / or clinical management purposes  to differentiate between  SARS-CoV-2 and other Sarbecovirus currently known to infect humans.  If clinically indicated additional testing with an alternate test  methodology [phone]) is  advised. The SARS-CoV-2 RNA is generally  detectable in upper and lower respiratory sp ecimens during the acute  phase of infection. The expected result is Negative. Fact Sheet for Patients:  StrictlyIdeas.no Fact Sheet for Healthcare Providers: BankingDealers.co.za This test is not yet approved or cleared by the [place] FDA and has been authorized for detection and/or diagnosis of SARS-CoV-2 by FDA under an Emergency Use Authorization (EUA).  This EUA will remain in effect (meaning this test can be used) for the duration of the COVID-19 declaration under Section 564(b)(1) of the Act, 21 U.S.C. section 360bbb-3(b)(1), unless the authorization is terminated or revoked sooner. Performed at [facility], [place] [address], [place], [place] [address]   MRSA PCR Screening     Status: None   Collection Time: 08/30/18 10:27 AM  Result Value Ref Range Status   MRSA by PCR NEGATIVE NEGATIVE Final    Comment:        The GeneXpert MRSA Assay (FDA approved for NASAL specimens only), is one component of a comprehensive MRSA colonization surveillance program. It is not intended to diagnose MRSA infection nor to guide or monitor treatment for MRSA infections. Performed at [facility], [place] [address], [place], [place] [address]      Studies: Ct Head Wo Contrast  Result Date: 08/29/2018 CLINICAL DATA:  Seizure, unwitnessed fall EXAM: CT HEAD WITHOUT CONTRAST CT CERVICAL SPINE WITHOUT CONTRAST TECHNIQUE: Multidetector CT imaging of the head and cervical spine was performed following the standard protocol without intravenous contrast. Multiplanar CT image reconstructions of the cervical spine were also generated. COMPARISON:  09/08/2012 FINDINGS: CT HEAD FINDINGS Brain: No evidence of acute infarction, hemorrhage, hydrocephalus, extra-axial collection or mass lesion/mass effect. Mild periventricular white matter hypodensity. Vascular:  No hyperdense vessel or unexpected calcification. Skull: Normal. Negative for fracture or focal lesion. Sinuses/Orbits: No acute finding. Other: None. CT CERVICAL SPINE FINDINGS Alignment: Normal. Skull base and vertebrae: There is a minimally displaced fracture of the left facet of C6 (series 11, image 33, series 9, image 58). There are additional comminuted fractures of the right facet and pedicle of C7, this level somewhat limited by motion artifact (series 12, image 60, series 11, image 34). No primary bone lesion or focal pathologic process. Soft tissues and spinal canal: No prevertebral fluid or swelling. No visible canal hematoma. Disc levels: There is severe multilevel disc and facet degenerative disease, worst from C3 through C7. Upper chest: Negative. Other: None. IMPRESSION: 1. No acute intracranial pathology. Small-vessel white matter disease. 2. There is a minimally displaced fracture of the left facet of C6 (series 11, image 33, series 9, image 58). There are additional comminuted fractures of the right facet and pedicle of C7, this level somewhat limited by motion artifact (series 12, image 60, series 11,  image 34). 3.  No other fracture or static subluxation of the cervical spine. 4. Severe multilevel disc and facet degenerative disease of the cervical spine. Electronically Signed   By: Eddie Candle M.D.   On: 08/29/2018 15:03   Ct Cervical Spine Wo Contrast  Result Date: 08/29/2018 CLINICAL DATA:  Seizure, unwitnessed fall EXAM: CT HEAD WITHOUT CONTRAST CT CERVICAL SPINE WITHOUT CONTRAST TECHNIQUE: Multidetector CT imaging of the head and cervical spine was performed following the standard protocol without intravenous contrast. Multiplanar CT image reconstructions of the cervical spine were also generated. COMPARISON:  09/08/2012 FINDINGS: CT HEAD FINDINGS Brain: No evidence of acute infarction, hemorrhage, hydrocephalus, extra-axial collection or mass lesion/mass effect. Mild periventricular  white matter hypodensity. Vascular: No hyperdense vessel or unexpected calcification. Skull: Normal. Negative for fracture or focal lesion. Sinuses/Orbits: No acute finding. Other: None. CT CERVICAL SPINE FINDINGS Alignment: Normal. Skull base and vertebrae: There is a minimally displaced fracture of the left facet of C6 (series 11, image 33, series 9, image 58). There are additional comminuted fractures of the right facet and pedicle of C7, this level somewhat limited by motion artifact (series 12, image 60, series 11, image 34). No primary bone lesion or focal pathologic process. Soft tissues and spinal canal: No prevertebral fluid or swelling. No visible canal hematoma. Disc levels: There is severe multilevel disc and facet degenerative disease, worst from C3 through C7. Upper chest: Negative. Other: None. IMPRESSION: 1. No acute intracranial pathology. Small-vessel white matter disease. 2. There is a minimally displaced fracture of the left facet of C6 (series 11, image 33, series 9, image 58). There are additional comminuted fractures of the right facet and pedicle of C7, this level somewhat limited by motion artifact (series 12, image 60, series 11, image 34). 3.  No other fracture or static subluxation of the cervical spine. 4. Severe multilevel disc and facet degenerative disease of the cervical spine. Electronically Signed   By: Eddie Candle M.D.   On: 08/29/2018 15:03    Scheduled Meds:  allopurinol  300 mg Oral Daily   Chlorhexidine Gluconate Cloth  6 each Topical Daily   citalopram  20 mg Oral QHS   insulin aspart  0-5 Units Subcutaneous QHS   insulin aspart  0-9 Units Subcutaneous TID WC   insulin detemir  16 Units Subcutaneous QAC breakfast   isosorbide mononitrate  120 mg Oral Daily   levothyroxine  50 mcg Oral Q0600   Melatonin  6 mg Oral QHS   mupirocin cream   Topical BID   rosuvastatin  10 mg Oral q1800   sodium chloride flush  3 mL Intravenous Q12H    Continuous  Infusions:  lactated ringers 75 mL/hr at 08/31/18 0700     Flora Lipps, MD  [facility] 08/31/2018

## 2018-08-31 NOTE — Discharge Instructions (Signed)
° ° °  Supplemental Discharge Instructions for  Pacemaker/Defibrillator Patients  Activity No heavy lifting or vigorous activity with your left/right arm for 6 to 8 weeks.  Do not raise your left/right arm above your head for one week.  Gradually raise your affected arm as drawn below.              09/04/2018                09/06/2018                   09/07/2018               09/08/2018 __  NO DRIVING until cleared to by neurology, primary doctor  WOUND CARE - Keep the wound area clean and dry.  Do not get this area wet, no showers until cleared to at your wound check visit - The tape/steri-strips on your wound will fall off; do not pull them off.  No bandage is needed on the site.  DO  NOT apply any creams, oils, or ointments to the wound area. - If you notice any drainage or discharge from the wound, any swelling or bruising at the site, or you develop a fever > 101? F after you are discharged home, call the office at once.  Special Instructions - You are still able to use cellular telephones; use the ear opposite the side where you have your pacemaker/defibrillator.  Avoid carrying your cellular phone near your device. - When traveling through airports, show security personnel your identification card to avoid being screened in the metal detectors.  Ask the security personnel to use the hand wand. - Avoid arc welding equipment, MRI testing (magnetic resonance imaging), TENS units (transcutaneous nerve stimulators).  Call the office for questions about other devices. - Avoid electrical appliances that are in poor condition or are not properly grounded. - Microwave ovens are safe to be near or to operate.

## 2018-08-31 NOTE — Progress Notes (Addendum)
Progress Note  Patient Name: [name] Date of Encounter: 08/31/2018  Primary Cardiologist: Dr. Acie Fredrickson  Subjective   No CP or SOB  Inpatient Medications    Scheduled Meds:  allopurinol  300 mg Oral Daily   Chlorhexidine Gluconate Cloth  6 each Topical Daily   citalopram  20 mg Oral QHS   insulin aspart  0-5 Units Subcutaneous QHS   insulin aspart  0-9 Units Subcutaneous TID WC   insulin detemir  16 Units Subcutaneous QAC breakfast   isosorbide mononitrate  120 mg Oral Daily   levothyroxine  50 mcg Oral Q0600   Melatonin  6 mg Oral QHS   mupirocin cream   Topical BID   rosuvastatin  10 mg Oral q1800   sodium chloride flush  3 mL Intravenous Q12H   Continuous Infusions:  lactated ringers 75 mL/hr at 08/31/18 0700   PRN Meds: acetaminophen **OR** acetaminophen, hydrALAZINE, nitroGLYCERIN, ondansetron **OR** ondansetron (ZOFRAN) IV, oxyCODONE-acetaminophen   Vital Signs    Vitals:   08/31/18 0400 08/31/18 0500 08/31/18 0600 08/31/18 0700  BP: (!) 120/52 (!) 128/45 (!) 110/45 (!) 125/48  Pulse: (!) 36 (!) 48 (!) 41 (!) 40  Resp: 16 13 17 16   Temp: 98.6 F (37 C)     TempSrc: Oral     SpO2: 96% 95% 91% 94%  Weight:  118.8 kg    Height:        Intake/Output Summary (Last 24 hours) at 08/31/2018 0736 Last data filed at 08/31/2018 0700 Gross per 24 hour  Intake 2071.88 ml  Output 2660 ml  Net -588.12 ml   Last 3 Weights 08/31/2018 08/30/2018 08/29/2018  Weight (lbs) 261 lb 14.5 oz 248 lb 10.9 oz 252 lb  Weight (kg) 118.8 kg 112.8 kg 114.306 kg      Telemetry    AFlutter 38-40's - Personally Reviewed  ECG    No new EKGs - Personally Reviewed  Physical Exam   GEN: No acute distress.   Neck: rigid C collar in place Cardiac: irreg-irreg, bradycardiac, no murmurs, rubs, or gallops.  Respiratory: CTA b/l (ant auscultation only). GI: not examined  MS: No edema; chronic looking skin changes Neuro:  no gross focal abnormalities  Psych:  Normal affect   Labs    Chemistry Recent Labs  Lab 08/29/18 1248 08/30/18 0319  NA 135 138  K 5.6* 5.1  CL 103 107  CO2 22 23  GLUCOSE 174* 145*  BUN 64* 61*  CREATININE 2.24* 2.23*  CALCIUM 9.1 8.7*  PROT 6.3*  --   ALBUMIN 3.9  --   AST 39  --   ALT 27  --   ALKPHOS 55  --   BILITOT 0.9  --   GFRNONAA 23* 23*  GFRAA 26* 27*  ANIONGAP 10 8     Hematology Recent Labs  Lab 08/29/18 1248 08/30/18 0319  WBC 7.5 7.0  RBC 3.35* 2.97*  HGB 10.7* 9.5*  HCT 34.0* 29.7*  MCV 101.5* 100.0  MCH 31.9 32.0  MCHC 31.5 32.0  RDW 14.9 15.1  PLT 140* 117*    Cardiac Enzymes Recent Labs  Lab 08/30/18 1138 08/30/18 1644 08/30/18 2319  TROPONINI 0.18* 0.16* 0.11*   No results for input(s): TROPIPOC in the last 168 hours.   BNPNo results for input(s): BNP, PROBNP in the last 168 hours.   DDimer No results for input(s): DDIMER in the last 168 hours.   Radiology    Ct Head Wo Contrast Result Date:  08/29/2018 CLINICAL DATA:  Seizure, unwitnessed fall EXAM: CT HEAD WITHOUT CONTRAST CT CERVICAL SPINE WITHOUT CONTRAST TECHNIQUE: Multidetector CT imaging of the head and cervical spine was performed following the standard protocol without intravenous contrast. Multiplanar CT image reconstructions of the cervical spine were also generated. COMPARISON:  09/08/2012 FINDINGS: CT HEAD FINDINGS Brain: No evidence of acute infarction, hemorrhage, hydrocephalus, extra-axial collection or mass lesion/mass effect. Mild periventricular white matter hypodensity. Vascular: No hyperdense vessel or unexpected calcification. Skull: Normal. Negative for fracture or focal lesion. Sinuses/Orbits: No acute finding. Other: None. CT CERVICAL SPINE FINDINGS Alignment: Normal. Skull base and vertebrae: There is a minimally displaced fracture of the left facet of C6 (series 11, image 33, series 9, image 58). There are additional comminuted fractures of the right facet and pedicle of C7, this level somewhat  limited by motion artifact (series 12, image 60, series 11, image 34). No primary bone lesion or focal pathologic process. Soft tissues and spinal canal: No prevertebral fluid or swelling. No visible canal hematoma. Disc levels: There is severe multilevel disc and facet degenerative disease, worst from C3 through C7. Upper chest: Negative. Other: None. IMPRESSION: 1. No acute intracranial pathology. Small-vessel white matter disease. 2. There is a minimally displaced fracture of the left facet of C6 (series 11, image 33, series 9, image 58). There are additional comminuted fractures of the right facet and pedicle of C7, this level somewhat limited by motion artifact (series 12, image 60, series 11, image 34). 3.  No other fracture or static subluxation of the cervical spine. 4. Severe multilevel disc and facet degenerative disease of the cervical spine. Electronically Signed   By: Eddie Candle M.D.   On: 08/29/2018 15:03     Cardiac Studies   05/08/16: TTE Study Conclusions - Left ventricle: The cavity size was normal. Wall thickness was   increased in a pattern of severe LVH. Systolic function was   normal. The estimated ejection fraction was in the range of 50%   to 55%. Wall motion was normal; there were no regional wall   motion abnormalities. The study is not technically sufficient to   allow evaluation of LV diastolic function. - Aortic valve: There was trivial regurgitation. - Mitral valve: Calcified annulus. There was mild regurgitation. - Left atrium: The atrium was severely dilated. - Right ventricle: The cavity size was mildly dilated. - Right atrium: The atrium was mildly dilated. - Pericardium, extracardiac: A trivial pericardial effusion was   identified. Impressions: - Normal LV function; severe LVH; calcified aortic valve with trace   AI; mild MR; severe LAE; mild RAE and RVE; trace TR.   03/13/15: R/LHC No significant coronary artery disease by coronary angiography,  circumflex coronary artery and LAD have separate ostia and has mild diffuse disease. Right coronary artery is dominant and appears normal. Moderate to severe mitral and left calcification. Normal LV systolic function.  PA pressure 54/22 with a mean of 39 mmHg. After intraoperative pulmonary arterial nitroglycerin, PA pressure reduced to 41/13 with a mean of 27 mmHg. Elevated cardiac output and cardiac index. Cardiac output 8.95, cardiac index 4.09 probably due to morbid obesity. No evidence of intracardiac shunting. 25 to 30 cc contrast used for procedure.  Patient Profile     63 y.o. female with PMHx of HTN, HLD, b/l DVT's w/recurrent PE (x3) (has IVC filter) and chronic diastolic dysfunction, morbid obesity, admitted after 2 syncopal events leading to a fall off the porch, suffering (by neurosurgeon note) C6 unilateral facet fracture,  right C7 unilateral facet fracture with possible extension into the right C7 pedicle. (felt to be poor surgical candidate by neurosurgery, with plans for rigid c-collar and out patient follow up in 2 weeks and go from there  Assessment & Plan    1. Recurrent syncope w/seizure-like     Resulting in fall/trauma     Suspect 2/2 symptomatic bradycardia   2. AFlutter w/SVR, rate generally 38-40's     Looks permanent     BP stable     No rest symptoms, mentating well     No reversible causes, no nodal blocking agents at home or here  She will need pacing   Dr. Rayann Heman has seen and examined the patient Discussed infection concerns with PPM implant, though given it at best will be 2 weeks until re-evaluation for rigid C-collar,  can not wait that long for pacing support. Discussed with the patient  Rational for PPM, the implant procedure and potential risks/benefits, the patient is agreeable to proceed  3. CP     Not recurrent, somewhat atypical     Flat trop, likely multifactorial with bradycardia, and CKD  4. CKD (IV)     Baseline about 1.7     Likely worse  with significant bradycardia  5. AFlutter, DVT and recurrent PE history     On warfarin     INR today is 2.3,      keep 2- max of 2.5 peri-operatively     NO HEPARIN            For questions or updates, please contact [facility] Please consult [email or web address] for contact info under        Signed, Baldwin Jamaica, PA-C  08/31/2018, 7:36 AM     I have seen, examined the patient, and reviewed the above assessment and plan.  Changes to above are made where necessary.  On exam, iRRR.  The patient has permanent afib/ atypical atrial flutter.  She also has symptomatic bradycardia due to second degree AV block with syncope.  I would therefore recommend pacemaker implantation at this time. She is not a candidate for leadless pacing due to her IVC filter (I have discussed this with MDT this am).   Risks, benefits, alternatives to pacemaker implantation were discussed in detail with the patient today. The patient understands that the risks include but are not limited to bleeding, infection, pneumothorax, perforation, tamponade, vascular damage, renal failure, MI, stroke, death,  and lead dislodgement and wishes to proceed. We will therefore schedule the procedure at the next available time.  We will plan for later today.  Keep NPO.     Co Sign: [name], MD 08/31/2018 10:20 AM

## 2018-08-31 NOTE — H&P (View-Only) (Signed)
Progress Note  Patient Name: [name] Date of Encounter: 08/31/2018  Primary Cardiologist: Dr. Acie Fredrickson  Subjective   No CP or SOB  Inpatient Medications    Scheduled Meds:  allopurinol  300 mg Oral Daily   Chlorhexidine Gluconate Cloth  6 each Topical Daily   citalopram  20 mg Oral QHS   insulin aspart  0-5 Units Subcutaneous QHS   insulin aspart  0-9 Units Subcutaneous TID WC   insulin detemir  16 Units Subcutaneous QAC breakfast   isosorbide mononitrate  120 mg Oral Daily   levothyroxine  50 mcg Oral Q0600   Melatonin  6 mg Oral QHS   mupirocin cream   Topical BID   rosuvastatin  10 mg Oral q1800   sodium chloride flush  3 mL Intravenous Q12H   Continuous Infusions:  lactated ringers 75 mL/hr at 08/31/18 0700   PRN Meds: acetaminophen **OR** acetaminophen, hydrALAZINE, nitroGLYCERIN, ondansetron **OR** ondansetron (ZOFRAN) IV, oxyCODONE-acetaminophen   Vital Signs    Vitals:   08/31/18 0400 08/31/18 0500 08/31/18 0600 08/31/18 0700  BP: (!) 120/52 (!) 128/45 (!) 110/45 (!) 125/48  Pulse: (!) 36 (!) 48 (!) 41 (!) 40  Resp: 16 13 17 16   Temp: 98.6 F (37 C)     TempSrc: Oral     SpO2: 96% 95% 91% 94%  Weight:  118.8 kg    Height:        Intake/Output Summary (Last 24 hours) at 08/31/2018 0736 Last data filed at 08/31/2018 0700 Gross per 24 hour  Intake 2071.88 ml  Output 2660 ml  Net -588.12 ml   Last 3 Weights 08/31/2018 08/30/2018 08/29/2018  Weight (lbs) 261 lb 14.5 oz 248 lb 10.9 oz 252 lb  Weight (kg) 118.8 kg 112.8 kg 114.306 kg      Telemetry    AFlutter 38-40's - Personally Reviewed  ECG    No new EKGs - Personally Reviewed  Physical Exam   GEN: No acute distress.   Neck: rigid C collar in place Cardiac: irreg-irreg, bradycardiac, no murmurs, rubs, or gallops.  Respiratory: CTA b/l (ant auscultation only). GI: not examined  MS: No edema; chronic looking skin changes Neuro:  no gross focal abnormalities  Psych:  Normal affect   Labs    Chemistry Recent Labs  Lab 08/29/18 1248 08/30/18 0319  NA 135 138  K 5.6* 5.1  CL 103 107  CO2 22 23  GLUCOSE 174* 145*  BUN 64* 61*  CREATININE 2.24* 2.23*  CALCIUM 9.1 8.7*  PROT 6.3*  --   ALBUMIN 3.9  --   AST 39  --   ALT 27  --   ALKPHOS 55  --   BILITOT 0.9  --   GFRNONAA 23* 23*  GFRAA 26* 27*  ANIONGAP 10 8     Hematology Recent Labs  Lab 08/29/18 1248 08/30/18 0319  WBC 7.5 7.0  RBC 3.35* 2.97*  HGB 10.7* 9.5*  HCT 34.0* 29.7*  MCV 101.5* 100.0  MCH 31.9 32.0  MCHC 31.5 32.0  RDW 14.9 15.1  PLT 140* 117*    Cardiac Enzymes Recent Labs  Lab 08/30/18 1138 08/30/18 1644 08/30/18 2319  TROPONINI 0.18* 0.16* 0.11*   No results for input(s): TROPIPOC in the last 168 hours.   BNPNo results for input(s): BNP, PROBNP in the last 168 hours.   DDimer No results for input(s): DDIMER in the last 168 hours.   Radiology    Ct Head Wo Contrast Result Date:  08/29/2018 CLINICAL DATA:  Seizure, unwitnessed fall EXAM: CT HEAD WITHOUT CONTRAST CT CERVICAL SPINE WITHOUT CONTRAST TECHNIQUE: Multidetector CT imaging of the head and cervical spine was performed following the standard protocol without intravenous contrast. Multiplanar CT image reconstructions of the cervical spine were also generated. COMPARISON:  09/08/2012 FINDINGS: CT HEAD FINDINGS Brain: No evidence of acute infarction, hemorrhage, hydrocephalus, extra-axial collection or mass lesion/mass effect. Mild periventricular white matter hypodensity. Vascular: No hyperdense vessel or unexpected calcification. Skull: Normal. Negative for fracture or focal lesion. Sinuses/Orbits: No acute finding. Other: None. CT CERVICAL SPINE FINDINGS Alignment: Normal. Skull base and vertebrae: There is a minimally displaced fracture of the left facet of C6 (series 11, image 33, series 9, image 58). There are additional comminuted fractures of the right facet and pedicle of C7, this level somewhat  limited by motion artifact (series 12, image 60, series 11, image 34). No primary bone lesion or focal pathologic process. Soft tissues and spinal canal: No prevertebral fluid or swelling. No visible canal hematoma. Disc levels: There is severe multilevel disc and facet degenerative disease, worst from C3 through C7. Upper chest: Negative. Other: None. IMPRESSION: 1. No acute intracranial pathology. Small-vessel white matter disease. 2. There is a minimally displaced fracture of the left facet of C6 (series 11, image 33, series 9, image 58). There are additional comminuted fractures of the right facet and pedicle of C7, this level somewhat limited by motion artifact (series 12, image 60, series 11, image 34). 3.  No other fracture or static subluxation of the cervical spine. 4. Severe multilevel disc and facet degenerative disease of the cervical spine. Electronically Signed   By: Eddie Candle M.D.   On: 08/29/2018 15:03     Cardiac Studies   05/08/16: TTE Study Conclusions - Left ventricle: The cavity size was normal. Wall thickness was   increased in a pattern of severe LVH. Systolic function was   normal. The estimated ejection fraction was in the range of 50%   to 55%. Wall motion was normal; there were no regional wall   motion abnormalities. The study is not technically sufficient to   allow evaluation of LV diastolic function. - Aortic valve: There was trivial regurgitation. - Mitral valve: Calcified annulus. There was mild regurgitation. - Left atrium: The atrium was severely dilated. - Right ventricle: The cavity size was mildly dilated. - Right atrium: The atrium was mildly dilated. - Pericardium, extracardiac: A trivial pericardial effusion was   identified. Impressions: - Normal LV function; severe LVH; calcified aortic valve with trace   AI; mild MR; severe LAE; mild RAE and RVE; trace TR.   03/13/15: R/LHC No significant coronary artery disease by coronary angiography,  circumflex coronary artery and LAD have separate ostia and has mild diffuse disease. Right coronary artery is dominant and appears normal. Moderate to severe mitral and left calcification. Normal LV systolic function.  PA pressure 54/22 with a mean of 39 mmHg. After intraoperative pulmonary arterial nitroglycerin, PA pressure reduced to 41/13 with a mean of 27 mmHg. Elevated cardiac output and cardiac index. Cardiac output 8.95, cardiac index 4.09 probably due to morbid obesity. No evidence of intracardiac shunting. 25 to 30 cc contrast used for procedure.  Patient Profile     63 y.o. female with PMHx of HTN, HLD, b/l DVT's w/recurrent PE (x3) (has IVC filter) and chronic diastolic dysfunction, morbid obesity, admitted after 2 syncopal events leading to a fall off the porch, suffering (by neurosurgeon note) C6 unilateral facet fracture,  right C7 unilateral facet fracture with possible extension into the right C7 pedicle. (felt to be poor surgical candidate by neurosurgery, with plans for rigid c-collar and out patient follow up in 2 weeks and go from there  Assessment & Plan    1. Recurrent syncope w/seizure-like     Resulting in fall/trauma     Suspect 2/2 symptomatic bradycardia   2. AFlutter w/SVR, rate generally 38-40's     Looks permanent     BP stable     No rest symptoms, mentating well     No reversible causes, no nodal blocking agents at home or here  She will need pacing   Dr. Rayann Heman has seen and examined the patient Discussed infection concerns with PPM implant, though given it at best will be 2 weeks until re-evaluation for rigid C-collar,  can not wait that long for pacing support. Discussed with the patient  Rational for PPM, the implant procedure and potential risks/benefits, the patient is agreeable to proceed  3. CP     Not recurrent, somewhat atypical     Flat trop, likely multifactorial with bradycardia, and CKD  4. CKD (IV)     Baseline about 1.7     Likely worse  with significant bradycardia  5. AFlutter, DVT and recurrent PE history     On warfarin     INR today is 2.3,      keep 2- max of 2.5 peri-operatively     NO HEPARIN            For questions or updates, please contact [facility] Please consult [email or web address] for contact info under        Signed, Baldwin Jamaica, PA-C  08/31/2018, 7:36 AM     I have seen, examined the patient, and reviewed the above assessment and plan.  Changes to above are made where necessary.  On exam, iRRR.  The patient has permanent afib/ atypical atrial flutter.  She also has symptomatic bradycardia due to second degree AV block with syncope.  I would therefore recommend pacemaker implantation at this time. She is not a candidate for leadless pacing due to her IVC filter (I have discussed this with MDT this am).   Risks, benefits, alternatives to pacemaker implantation were discussed in detail with the patient today. The patient understands that the risks include but are not limited to bleeding, infection, pneumothorax, perforation, tamponade, vascular damage, renal failure, MI, stroke, death,  and lead dislodgement and wishes to proceed. We will therefore schedule the procedure at the next available time.  We will plan for later today.  Keep NPO.     Co Sign: [name], MD 08/31/2018 10:20 AM

## 2018-08-31 NOTE — Progress Notes (Signed)
Orthopedic Tech Progress Note Patient Details:  [name] 11-Dec-1955 [id number]  Ortho Devices Type of Ortho Device: Arm sling Ortho Device/Splint Interventions: Application   Post Interventions Patient Tolerated: Well Instructions Provided: Care of device   [name] 08/31/2018, 3:37 PM

## 2018-08-31 NOTE — Interval H&P Note (Signed)
History and Physical Interval Note:  08/31/2018 10:23 AM  [name]  has presented today for surgery, with the diagnosis of heart block.  The various methods of treatment have been discussed with the patient and family. After consideration of risks, benefits and other options for treatment, the patient has consented to  Procedure(s): PACEMAKER IMPLANT (N/A) as a surgical intervention.  The patient's history has been reviewed, patient examined, no change in status, stable for surgery.  I have reviewed the patient's chart and labs.  Questions were answered to the patient's satisfaction.     [name]

## 2018-09-01 ENCOUNTER — Encounter (HOSPITAL_COMMUNITY): Payer: Self-pay | Admitting: Internal Medicine

## 2018-09-01 ENCOUNTER — Inpatient Hospital Stay (HOSPITAL_COMMUNITY): Payer: Medicare HMO

## 2018-09-01 DIAGNOSIS — S12501A Unspecified nondisplaced fracture of sixth cervical vertebra, initial encounter for closed fracture: Secondary | ICD-10-CM

## 2018-09-01 DIAGNOSIS — S12500A Unspecified displaced fracture of sixth cervical vertebra, initial encounter for closed fracture: Secondary | ICD-10-CM | POA: Diagnosis present

## 2018-09-01 DIAGNOSIS — I480 Paroxysmal atrial fibrillation: Secondary | ICD-10-CM | POA: Diagnosis present

## 2018-09-01 DIAGNOSIS — I4892 Unspecified atrial flutter: Secondary | ICD-10-CM | POA: Diagnosis present

## 2018-09-01 DIAGNOSIS — N179 Acute kidney failure, unspecified: Secondary | ICD-10-CM

## 2018-09-01 DIAGNOSIS — E785 Hyperlipidemia, unspecified: Secondary | ICD-10-CM

## 2018-09-01 DIAGNOSIS — I4891 Unspecified atrial fibrillation: Secondary | ICD-10-CM | POA: Diagnosis present

## 2018-09-01 DIAGNOSIS — Z86711 Personal history of pulmonary embolism: Secondary | ICD-10-CM

## 2018-09-01 DIAGNOSIS — Z959 Presence of cardiac and vascular implant and graft, unspecified: Secondary | ICD-10-CM

## 2018-09-01 LAB — BASIC METABOLIC PANEL
Anion gap: 10 (ref 5–15)
BUN: 47 mg/dL — ABNORMAL HIGH (ref 8–23)
CO2: 23 mmol/L (ref 22–32)
Calcium: 8.9 mg/dL (ref 8.9–10.3)
Chloride: 105 mmol/L (ref 98–111)
Creatinine, Ser: 1.87 mg/dL — ABNORMAL HIGH (ref 0.44–1.00)
GFR calc Af Amer: 33 mL/min — ABNORMAL LOW (ref >60)
GFR calc non Af Amer: 28 mL/min — ABNORMAL LOW (ref >60)
Glucose, Bld: 117 mg/dL — ABNORMAL HIGH (ref 70–99)
Potassium: 5.1 mmol/L (ref 3.5–5.1)
Sodium: 138 mmol/L (ref 135–145)

## 2018-09-01 LAB — CBC
HCT: 30.9 % — ABNORMAL LOW (ref 36.0–46.0)
Hemoglobin: 9.6 g/dL — ABNORMAL LOW (ref 12.0–15.0)
MCH: 31.7 pg (ref 26.0–34.0)
MCHC: 31.1 g/dL (ref 30.0–36.0)
MCV: 102 fL — ABNORMAL HIGH (ref 80.0–100.0)
Platelets: 103 10*3/uL — ABNORMAL LOW (ref 150–400)
RBC: 3.03 MIL/uL — ABNORMAL LOW (ref 3.87–5.11)
RDW: 14.7 % (ref 11.5–15.5)
WBC: 6.6 10*3/uL (ref 4.0–10.5)
nRBC: 0 % (ref 0.0–0.2)

## 2018-09-01 LAB — GLUCOSE, CAPILLARY
Glucose-Capillary: 116 mg/dL — ABNORMAL HIGH (ref 70–99)
Glucose-Capillary: 93 mg/dL (ref 70–99)
Glucose-Capillary: 107 mg/dL — ABNORMAL HIGH (ref 70–99)
Glucose-Capillary: 140 mg/dL — ABNORMAL HIGH (ref 70–99)

## 2018-09-01 LAB — PROTIME-INR
INR: 2.3 — ABNORMAL HIGH (ref 0.8–1.2)
Prothrombin Time: 24.9 seconds — ABNORMAL HIGH (ref 11.4–15.2)

## 2018-09-01 MED ORDER — WARFARIN - PHARMACIST DOSING INPATIENT: Freq: Every day | Status: DC

## 2018-09-01 MED ORDER — WARFARIN SODIUM 2.5 MG PO TABS
2.5000 mg | ORAL_TABLET | Freq: Once | ORAL | Status: AC
  Administered 2018-09-01: 19:00:00 2.5 mg via ORAL
  Filled 2018-09-01: qty 1

## 2018-09-01 MED FILL — Lidocaine HCl Local Inj 1%: INTRAMUSCULAR | Qty: 80 | Status: AC

## 2018-09-01 NOTE — Progress Notes (Addendum)
Progress Note  Patient Name: [name] Date of Encounter: 09/01/2018  Primary Cardiologist: Dr. Acie Fredrickson  Subjective   Feels so much better today, aching at site but feels like she is breathing easier, no chest pain/heaviness  Inpatient Medications    Scheduled Meds:  allopurinol  300 mg Oral Daily   citalopram  20 mg Oral QHS   insulin aspart  0-5 Units Subcutaneous QHS   insulin aspart  0-9 Units Subcutaneous TID WC   insulin detemir  16 Units Subcutaneous QAC breakfast   isosorbide mononitrate  120 mg Oral Daily   levothyroxine  50 mcg Oral Q0600   Melatonin  6 mg Oral QHS   mupirocin cream   Topical BID   rosuvastatin  10 mg Oral q1800   sodium chloride flush  3 mL Intravenous Q12H   Continuous Infusions:  sodium chloride 10 mL/hr at 09/01/18 0200   PRN Meds: sodium chloride, [DISCONTINUED] acetaminophen **OR** acetaminophen, acetaminophen, ondansetron (ZOFRAN) IV, oxyCODONE-acetaminophen, sodium chloride flush   Vital Signs    Vitals:   08/31/18 1619 08/31/18 2118 09/01/18 0618 09/01/18 0711  BP: (!) 168/45 (!) 170/61 (!) 129/58   Pulse:  74 69 67  Resp: 13 19 13    Temp:      TempSrc:      SpO2:    96%  Weight:      Height:        Intake/Output Summary (Last 24 hours) at 09/01/2018 0948 Last data filed at 09/01/2018 0200 Gross per 24 hour  Intake 2147.36 ml  Output 1075 ml  Net 1072.36 ml   Last 3 Weights 08/31/2018 08/30/2018 08/29/2018  Weight (lbs) 261 lb 14.5 oz 248 lb 10.9 oz 252 lb  Weight (kg) 118.8 kg 112.8 kg 114.306 kg      Telemetry    AFlutter V pacing primarily, 60's-70's - Personally Reviewed  ECG    AFlutter/V paced - Personally Reviewed  Physical Exam   GEN: No acute distress.   Neck: rigid C collar in place Cardiac: RRR, no murmurs, rubs, or gallops.  Respiratory: CTA b/l (ant auscultation only). GI: not examined  MS: No edema; chronic looking skin changes Neuro:  no gross focal abnormalities  Psych:  Normal affect   PPM site (L chest): dressing is dry, no hematoma  Labs    Chemistry Recent Labs  Lab 08/29/18 1248 08/30/18 0319 09/01/18 0344  NA 135 138 138  K 5.6* 5.1 5.1  CL 103 107 105  CO2 22 23 23   GLUCOSE 174* 145* 117*  BUN 64* 61* 47*  CREATININE 2.24* 2.23* 1.87*  CALCIUM 9.1 8.7* 8.9  PROT 6.3*  --   --   ALBUMIN 3.9  --   --   AST 39  --   --   ALT 27  --   --   ALKPHOS 55  --   --   BILITOT 0.9  --   --   GFRNONAA 23* 23* 28*  GFRAA 26* 27* 33*  ANIONGAP 10 8 10      Hematology Recent Labs  Lab 08/29/18 1248 08/30/18 0319 09/01/18 0344  WBC 7.5 7.0 6.6  RBC 3.35* 2.97* 3.03*  HGB 10.7* 9.5* 9.6*  HCT 34.0* 29.7* 30.9*  MCV 101.5* 100.0 102.0*  MCH 31.9 32.0 31.7  MCHC 31.5 32.0 31.1  RDW 14.9 15.1 14.7  PLT 140* 117* 103*    Cardiac Enzymes Recent Labs  Lab 08/30/18 1138 08/30/18 1644 08/30/18 2319  TROPONINI 0.18* 0.16*  0.11*   No results for input(s): TROPIPOC in the last 168 hours.   BNPNo results for input(s): BNP, PROBNP in the last 168 hours.   DDimer No results for input(s): DDIMER in the last 168 hours.   Radiology    Ct Head Wo Contrast Result Date: 08/29/2018 CLINICAL DATA:  Seizure, unwitnessed fall EXAM: CT HEAD WITHOUT CONTRAST CT CERVICAL SPINE WITHOUT CONTRAST TECHNIQUE: Multidetector CT imaging of the head and cervical spine was performed following the standard protocol without intravenous contrast. Multiplanar CT image reconstructions of the cervical spine were also generated. COMPARISON:  09/08/2012 FINDINGS: CT HEAD FINDINGS Brain: No evidence of acute infarction, hemorrhage, hydrocephalus, extra-axial collection or mass lesion/mass effect. Mild periventricular white matter hypodensity. Vascular: No hyperdense vessel or unexpected calcification. Skull: Normal. Negative for fracture or focal lesion. Sinuses/Orbits: No acute finding. Other: None. CT CERVICAL SPINE FINDINGS Alignment: Normal. Skull base and vertebrae: There  is a minimally displaced fracture of the left facet of C6 (series 11, image 33, series 9, image 58). There are additional comminuted fractures of the right facet and pedicle of C7, this level somewhat limited by motion artifact (series 12, image 60, series 11, image 34). No primary bone lesion or focal pathologic process. Soft tissues and spinal canal: No prevertebral fluid or swelling. No visible canal hematoma. Disc levels: There is severe multilevel disc and facet degenerative disease, worst from C3 through C7. Upper chest: Negative. Other: None. IMPRESSION: 1. No acute intracranial pathology. Small-vessel white matter disease. 2. There is a minimally displaced fracture of the left facet of C6 (series 11, image 33, series 9, image 58). There are additional comminuted fractures of the right facet and pedicle of C7, this level somewhat limited by motion artifact (series 12, image 60, series 11, image 34). 3.  No other fracture or static subluxation of the cervical spine. 4. Severe multilevel disc and facet degenerative disease of the cervical spine. Electronically Signed   By: Eddie Candle M.D.   On: 08/29/2018 15:03     Cardiac Studies   05/08/16: TTE Study Conclusions - Left ventricle: The cavity size was normal. Wall thickness was   increased in a pattern of severe LVH. Systolic function was   normal. The estimated ejection fraction was in the range of 50%   to 55%. Wall motion was normal; there were no regional wall   motion abnormalities. The study is not technically sufficient to   allow evaluation of LV diastolic function. - Aortic valve: There was trivial regurgitation. - Mitral valve: Calcified annulus. There was mild regurgitation. - Left atrium: The atrium was severely dilated. - Right ventricle: The cavity size was mildly dilated. - Right atrium: The atrium was mildly dilated. - Pericardium, extracardiac: A trivial pericardial effusion was   identified. Impressions: - Normal LV  function; severe LVH; calcified aortic valve with trace   AI; mild MR; severe LAE; mild RAE and RVE; trace TR.   03/13/15: R/LHC No significant coronary artery disease by coronary angiography, circumflex coronary artery and LAD have separate ostia and has mild diffuse disease. Right coronary artery is dominant and appears normal. Moderate to severe mitral and left calcification. Normal LV systolic function.  PA pressure 54/22 with a mean of 39 mmHg. After intraoperative pulmonary arterial nitroglycerin, PA pressure reduced to 41/13 with a mean of 27 mmHg. Elevated cardiac output and cardiac index. Cardiac output 8.95, cardiac index 4.09 probably due to morbid obesity. No evidence of intracardiac shunting. 25 to 30 cc contrast used  for procedure.  Patient Profile     63 y.o. female with PMHx of HTN, HLD, b/l DVT's w/recurrent PE (x3) (has IVC filter) and chronic diastolic dysfunction, morbid obesity, admitted after 2 syncopal events leading to a fall off the porch, suffering (by neurosurgeon note) C6 unilateral facet fracture, right C7 unilateral facet fracture with possible extension into the right C7 pedicle. (felt to be poor surgical candidate by neurosurgery, with plans for rigid c-collar and out patient follow up in 2 weeks and go from there  Assessment & Plan    1. Recurrent syncope w/seizure-like     Resulting in fall/trauma     Suspect 2/2 symptomatic bradycardia   2. AFlutter w/SVR, rate generally 38-40's     permanent     No reversible causes, no nodal blocking agents at home or here  Now s/p MDT single chamber PPM implant yesterday CXR without pneumothorax this AM, Dr. Rayann Heman has reviewed, stable lead position Site care and activity instructions discussed with the patient routine post implant EP follow up is in place Device check planned at wound check visit  I have left the tegaderm dressing in place for now, needs to be removed day of discharge, steri-strips to remain in  place   3. CP     Not recurrent, somewhat atypical     Flat trop, likely multifactorial with bradycardia, and CKD  4. CKD (IV)     Baseline about 1.7     Better this AM  5. AFlutter, DVT and recurrent PE history     On warfarin     INR today is 2.3 again     keep 2- max of 2.5 peri-operatively     NO HEPARIN       EP service will sign off, though remain available.  Please recall if needed.     For questions or updates, please contact [facility] Please consult [email or web address] for contact info under        Signed, Baldwin [place], PA-C  09/01/2018, 9:48 AM    I have reviewed the above assessment and plan.  Changes to above are made where necessary.  CXR reveals stable lea, no ptx.  Doing well s/p PPM No further inpatient EP workup planned. Please keep INR <2.5 to avoid device site bleeding over the next week.    Co Sign: [name], MD 09/01/2018 5:51 PM

## 2018-09-01 NOTE — Progress Notes (Signed)
[place] for Warfarin Indication: atrial fibrillation  Allergies  Allergen Reactions  . Morphine And Related Rash    Patient Measurements: Height: 5\' 2"  (157.5 cm) Weight: 261 lb 14.5 oz (118.8 kg) IBW/kg (Calculated) : 50.1 Heparin Dosing Weight: 78 kg  Vital Signs: BP: 129/58 (05/27 0618) Pulse Rate: 67 (05/27 0711)  Labs: Recent Labs    08/29/18 1248 08/30/18 0319 08/30/18 1138 08/30/18 1644 08/30/18 2319 08/31/18 0236 09/01/18 0344  HGB 10.7* 9.5*  --   --   --   --  9.6*  HCT 34.0* 29.7*  --   --   --   --  30.9*  PLT 140* 117*  --   --   --   --  103*  LABPROT 28.6* 29.1*  --   --   --  25.1* 24.9*  INR 2.7* 2.8*  --   --   --  2.3* 2.3*  CREATININE 2.24* 2.23*  --   --   --   --  1.87*  TROPONINI  --   --  0.18* 0.16* 0.11*  --   --     Estimated Creatinine Clearance: 38.2 mL/min (A) (by C-G formula based on SCr of 1.87 mg/dL (H)).   Assessment: 26 YOF who [facility] to [facility] on [facility] with syncope/seizures. The patient was on warfarin PTA for hx Afib  INR 2.3 today - to keep less than 2.5 around pacemaker placement  PTA regimen 5 mg daily except 7.5 mg on Sun/Tues/Thurs.   Goal of Therapy:  Heparin level 0.3-0.7 units/ml Monitor platelets by anticoagulation protocol: Yes   Plan:  Warfarin 2.5 mg po x 1 dose today (half of home dose) Daily INR  Thank you Anette Guarneri, PharmD [phone] 09/01/2018 11:14 AM   **Pharmacist phone directory can now be found on [email or web address] ([email or web address]).  Listed under [place].

## 2018-09-01 NOTE — Evaluation (Signed)
Occupational Therapy Evaluation Patient Details Name: [name] MRN: [id number] DOB: 19-Sep-1955 Today's Date: 09/01/2018    History of Present Illness  63 y.o. female with PMHx including DVT/PE (2014); OSA; hypothyroidism; HTN; HLD; chronic low back pain; CHF; and afib on Coumadin now presenting with syncope and x2 falls prior to EMS arrival.  CT C-spine revealed Left C6 unilateral facet fracture, right C7 unilateral facet fracture with possible extension into the right C7 pedicle, currently being treated non-operatively. Pt is now s/p pacemaker placement on 5/26.    Clinical Impression   This 63 y/o female presents with the above. PTA pt reports she is mod independent with ADL and functional mobility using RW when outside of the home. Pt presenting with pain, generalized weakness, decreased endurance and standing balance. Pt requiring +2 HHA for functional mobility within room today, verbal cues to limit wt bearing through LUE due to pacemaker precautions. She currently requires minA for seated UB ADL, modA for LB ADL. Educated pt on cervical and pacemaker precautions - will benefit from continued review and education during functional task performance. Pt will benefit from continued acute OT services and recommend follow up [facility] therapy services to maximize her safety and independence with ADL and mobility. Will follow.     Follow Up Recommendations  Home health OT;Supervision/Assistance - 24 hour    Equipment Recommendations  3 in 1 bedside commode(may require bariatric)           Precautions / Restrictions Precautions Precautions: Cervical;Fall;ICD/Pacemaker Precaution Comments: verbally reviewed pacemaker, cervical precautions Required Braces or Orthoses: Cervical Brace(pt with sling in room for comfort) Cervical Brace: Hard collar;At all times Restrictions Weight Bearing Restrictions: Yes LUE Weight Bearing: (pacemaker)      Mobility Bed Mobility Overal bed mobility:  Needs Assistance Bed Mobility: Supine to Sit     Supine to sit: Mod assist     General bed mobility comments: educated pt in use of log roll technique however pt reports increased pain with attempts to roll towards R side; able to perform partial roll prior to transition to sitting EOB; assist for trunk elevation  Transfers Overall transfer level: Needs assistance Equipment used: 2 person hand held assist Transfers: Sit to/from Stand Sit to Stand: Min assist;+2 physical assistance;+2 safety/equipment         General transfer comment: boosting assist from EOB and assist to steady upon initial standing; VCs to limit pushing through LUE     Balance Overall balance assessment: Needs assistance Sitting-balance support: Feet supported Sitting balance-[name]: Fair     Standing balance support: Bilateral upper extremity supported Standing balance-[name]: Poor Standing balance comment: reliant on UE support at this time                           ADL either performed or assessed with clinical judgement   ADL Overall ADL's : Needs assistance/impaired Eating/Feeding: Modified independent;Sitting   Grooming: Wash/dry face;Set up;Min guard;Sitting   Upper Body Bathing: Minimal assistance;Sitting   Lower Body Bathing: Moderate assistance;+2 for safety/equipment;Sit to/from stand   Upper Body Dressing : Minimal assistance;Moderate assistance;Sitting Upper Body Dressing Details (indicate cue type and reason): due to limited motion with pacemaker precautions Lower Body Dressing: Moderate assistance;Sit to/from stand Lower Body Dressing Details (indicate cue type and reason): assist to don slippers seated EOB Toilet Transfer: Minimal assistance;+2 for physical assistance;+2 for safety/equipment;Ambulation Toilet Transfer Details (indicate cue type and reason): simulated via transfer to recliner; +2  HHA Toileting- Water quality scientist and Hygiene: Moderate  assistance;Sit to/from stand       Functional mobility during ADLs: Minimal assistance;+2 for physical assistance;+2 for safety/equipment(HHA)       Vision         Perception     Praxis      Pertinent Vitals/Pain Pain Assessment: Faces Faces Pain Scale: Hurts little more Pain Location: LUE with certain movements Pain Descriptors / Indicators: Discomfort;Sore Pain Intervention(s): Monitored during session;Repositioned     Hand Dominance     Extremity/Trunk Assessment Upper Extremity Assessment Upper Extremity Assessment: LUE deficits/detail LUE Deficits / Details: limited due to pacemaker precautions; pt with increased pain intermittently with activity  LUE: Unable to fully assess due to pain LUE Coordination: decreased gross motor   Lower Extremity Assessment Lower Extremity Assessment: Defer to PT evaluation   Cervical / Trunk Assessment Cervical / Trunk Assessment: Other exceptions Cervical / Trunk Exceptions: currently with cervical collar   Communication Communication Communication: No difficulties   Cognition Arousal/Alertness: Awake/alert Behavior During Therapy: WFL for tasks assessed/performed Overall Cognitive Status: Within Functional Limits for tasks assessed                                     General Comments  VSS with room level activity today, HR maintaining in 60s-70s    Exercises     Shoulder Instructions      Home Living Family/patient expects to be discharged to:: Private residence Living Arrangements: Spouse/significant other(faince) Available Help at Discharge: Family;Available 24 hours/day Type of Home: House Home Access: Stairs to enter [employer] of Steps: 4 Entrance Stairs-Rails: Right;Left Home Layout: One level     Bathroom Shower/Tub: Teacher, early years/pre: Standard     Home Equipment: Tub bench;Walker - 4 wheels;Cane - single point          Prior Functioning/Environment  Level of Independence: Independent with assistive device(s)        Comments: Does not use AD inside the house. Has to use RW outside of home.         OT Problem List: Decreased strength;Decreased range of motion;Decreased activity tolerance;Impaired balance (sitting and/or standing);Decreased knowledge of use of DME or AE;Decreased knowledge of precautions;Obesity;Pain;Impaired UE functional use      OT Treatment/Interventions: Self-care/ADL training;Therapeutic exercise;Neuromuscular education;DME and/or AE instruction;Therapeutic activities;Patient/family education;Balance training    OT Goals(Current goals can be found in the care plan section) Acute Rehab OT Goals Patient Stated Goal: regain independence, return home OT Goal Formulation: With patient Time For Goal Achievement: 09/15/18 Potential to Achieve Goals: Good  OT Frequency: Min 2X/week   Barriers to D/C:            Co-evaluation PT/OT/SLP Co-Evaluation/Treatment: Yes Reason for Co-Treatment: Complexity of the patient's impairments (multi-system involvement);For patient/therapist safety;To address functional/ADL transfers PT goals addressed during session: Mobility/safety with mobility;Balance OT goals addressed during session: ADL's and self-care      AM-PAC OT "6 Clicks" Daily Activity     Outcome Measure Help from another person eating meals?: None Help from another person taking care of personal grooming?: A Little Help from another person toileting, which includes using toliet, bedpan, or urinal?: A Lot Help from another person bathing (including washing, rinsing, drying)?: A Lot Help from another person to put on and taking off regular upper body clothing?: A Little Help from another person to put on and taking off regular  lower body clothing?: A Lot 6 Click Score: 16   End of Session Equipment Utilized During Treatment: Cervical collar Nurse Communication: Mobility status  Activity Tolerance: Patient  tolerated treatment well Patient left: in chair;with call bell/phone within reach  OT Visit Diagnosis: Unsteadiness on feet (R26.81);History of falling (Z91.81);Muscle weakness (generalized) (M62.81)                Time: 0638-6854 OT Time Calculation (min): 23 min Charges:  OT General Charges $OT Visit: 1 Visit OT Evaluation $OT Eval Moderate Complexity: [place], OT [employer] Pager [phone] Office [phone]  Raymondo Band 09/01/2018, 3:55 PM

## 2018-09-01 NOTE — Progress Notes (Signed)
09/01/18 1651  PT Visit Information  Last PT Received On 09/01/18  Assistance Needed +2 (for chair follow for mobility progression )  PT/OT/SLP Co-Evaluation/Treatment Yes  Reason for Co-Treatment Complexity of the patient's impairments (multi-system involvement);For patient/therapist safety;To address functional/ADL transfers  PT goals addressed during session Mobility/safety with mobility;Balance  History of Present Illness  63 y.o. female with PMHx including DVT/PE (2014); OSA; hypothyroidism; HTN; HLD; chronic low back pain; CHF; and afib on Coumadin now presenting with syncope and x2 falls prior to EMS arrival.  CT C-spine revealed Left C6 unilateral facet fracture, right C7 unilateral facet fracture with possible extension into the right C7 pedicle, currently being treated non-operatively. Pt is now s/p pacemaker placement on 5/26 secondary to symptomatic bradycardia.   Precautions  Precautions Cervical;Fall;ICD/Pacemaker  Precaution Booklet Issued No  Precaution Comments verbally reviewed pacemaker, cervical precautions  Required Braces or Orthoses Cervical Brace  Cervical Brace Hard collar;At all times  Restrictions  Weight Bearing Restrictions Yes  LUE Weight Bearing  (pacemaker)  Home Living  Family/patient expects to be discharged to: Private residence  Living Arrangements Spouse/significant other (fiance)  Available Help at Discharge Family;Available 24 hours/day  Type of Home House  Home Access Stairs to enter  Entrance Stairs-Number of Steps 4  Entrance Stairs-Rails Right;Left  Home Layout One level  Bathroom Therapist, music Tub bench;Walker - 4 wheels;Cane - single point  Prior Function  Level of Independence Independent with assistive device(s)  Comments Does not use AD inside the house. Has to use rollator outside of home.   Communication  Communication No difficulties  Pain Assessment  Pain Assessment  Faces  Faces Pain Scale 4  Pain Location LUE with certain movements  Pain Descriptors / Indicators Discomfort;Sore  Pain Intervention(s) Limited activity within patient's tolerance;Monitored during session;Repositioned  Cognition  Arousal/Alertness Awake/alert  Behavior During Therapy WFL for tasks assessed/performed  Overall Cognitive Status Within Functional Limits for tasks assessed  Upper Extremity Assessment  Upper Extremity Assessment Defer to OT evaluation  Lower Extremity Assessment  Lower Extremity Assessment Generalized weakness;RLE deficits/detail  RLE Deficits / Details Pt with cut on R foot secondary to fall  Cervical / Trunk Assessment  Cervical / Trunk Assessment Other exceptions  Cervical / Trunk Exceptions currently with cervical collar  Bed Mobility  Overal bed mobility Needs Assistance  Bed Mobility Supine to Sit  Supine to sit Mod assist  General bed mobility comments educated pt in use of log roll technique however pt reports increased pain with attempts to roll towards R side; able to perform partial roll prior to transition to sitting EOB; assist for trunk elevation  Transfers  Overall transfer level Needs assistance  Equipment used 2 person hand held assist  Transfers Sit to/from Stand  Sit to Stand Min assist;+2 physical assistance;+2 safety/equipment  General transfer comment boosting assist from EOB and assist to steady upon initial standing; VCs to limit pushing through LUE   Ambulation/Gait  Ambulation/Gait assistance Min assist;+2 physical assistance;+2 safety/equipment  Gait Distance (Feet) 20 Feet  Assistive device 2 person hand held assist  Gait Pattern/deviations Step-through pattern;Decreased stride length  General Gait Details Slow, waddle type gait. Bilateral HHA for increased stability. No overt LOB noted.   Gait velocity Decreased   Balance  Overall balance assessment Needs assistance  Sitting-balance support Feet supported  Sitting  balance-[name] Fair  Standing balance support Bilateral upper extremity supported  Standing balance-[name] Poor  Standing balance  comment reliant on UE support at this time  General Comments  General comments (skin integrity, edema, etc.) VSS with room level activity today, HR maintaining in 60s-70s  PT - End of Session  Equipment Utilized During Treatment Gait belt;Cervical collar  Activity Tolerance Patient tolerated treatment well  Patient left in chair;with call bell/phone within reach;with chair alarm set  Nurse Communication Mobility status  PT Assessment  PT Recommendation/Assessment Patient needs continued PT services  PT Visit Diagnosis Unsteadiness on feet (R26.81);Muscle weakness (generalized) (M62.81);History of falling (Z91.81)  PT Problem List Decreased strength;Decreased range of motion;Decreased balance;Decreased mobility;Decreased knowledge of use of DME;Decreased knowledge of precautions;Pain  PT Plan  PT Frequency (ACUTE ONLY) Min 3X/week  PT Treatment/Interventions (ACUTE ONLY) Gait training;DME instruction;Stair training;Functional mobility training;Therapeutic activities;Therapeutic exercise;Balance training;Patient/family education  AM-PAC PT "6 Clicks" Mobility Outcome Measure (Version 2)  Help needed turning from your back to your side while in a flat bed without using bedrails? 2  Help needed moving from lying on your back to sitting on the side of a flat bed without using bedrails? 2  Help needed moving to and from a bed to a chair (including a wheelchair)? 3  Help needed standing up from a chair using your arms (e.g., wheelchair or bedside chair)? 3  Help needed to walk in hospital room? 3  Help needed climbing 3-5 steps with a railing?  2  6 Click Score 15  Consider Recommendation of Discharge To: CIR/SNF/LTACH  PT Recommendation  Follow Up Recommendations Home health PT;Supervision/Assistance - 24 hour  PT equipment Rolling walker with 5" wheels   Individuals Consulted  Consulted and Agree with Results and Recommendations Patient  Acute Rehab PT Goals  Patient Stated Goal regain independence, return home  PT Goal Formulation With patient  Time For Goal Achievement 09/15/18  Potential to Achieve Goals Good  PT Time Calculation  PT Start Time (ACUTE ONLY) 1442  PT Stop Time (ACUTE ONLY) 1505  PT Time Calculation (min) (ACUTE ONLY) 23 min  PT General Charges  $$ ACUTE PT VISIT 1 Visit  PT Evaluation  $PT Eval Moderate Complexity 1 Mod   Pt admitted secondary to problem above with deficits above. Pt requiring min A +2 for safety and HHA during mobility tasks this session. Educated about pacemaker and cervical precautions. Anticipate pt will progress well and be able to d/c home with HHPT and assist from her fiance. Will continue to follow acutely to maximize functional mobility independence and safety.   Leighton Ruff, PT, DPT  Acute Rehabilitation Services  Pager: [phone] Office: [phone]

## 2018-09-01 NOTE — Progress Notes (Addendum)
[facility] PROGRESS NOTE  [name] WNI:[id number] DOB: 04-Jun-1955 DOA: 08/29/2018 PCP: [name], MD  Assessment/Plan: Syncope with seizure-like activity presumed secondary to symptomatic bradycardia related to second-degree AV block.  Status post pacemaker placement on 5/26.  Heart rate doing well in the 60s.  Follow-up with EP cardiology arranged for discharge, as well as device check/wound check.  Tegaderm dressing needs to be removed prior to discharge and keep Steri-Strips in place per cardiology  Atrial flutter with slow ventricular rate.  Rate now in the 60s as mentioned above.  Status post pacemaker, not on any nodal blocking agents here.  Continue warfarin for a flutter and previous history of DVT or current PE.  Maintain INR (2.3 today, goal < 2.5 to prevent bleeding from surgical site)  Minimally displaced C6 fracture.  Evaluated by Dr. Venetia Constable on admission with neurosurgery who recommends continuing hard cervical collar.  Will follow-up with outpatient imaging as patient is not a candidate for surgical intervention.  Neurosurgery will follow-up in 2 weeks.  CKD stage IV, creatinine improving and getting closer to baseline (1.4-1.7).  Avoid nephrotoxins, monitor BMP, monitor output  Depression, stable continue home Celexa  Hyperlipidemia, stable continue Crestor  Hypothyroidism, stable continue Synthroid  CAD stable, no chest pain.  Continue Imdur  Type 2 diabetes.  Continue home Levemir, sliding scale as needed, monitor CBG  Gout, stable continue allopurinol   Code Status: Full code Family Communication: No family at bedside (indicate person spoken with, relationship, and if by phone, the number) Disposition Plan: Continue to monitor heart rate, dressing site while on warfarin,, PT/OT evaluation prior to discharge in next 24 hours.   Consultants:  Cardiology/EP  Procedures:  Pacemaker placement 5/26     HPI/Subjective:  [name] is a  63 y.o. year old female with medical history significant for DM; DVT/PE (2014); OSA; hypothyroidism; HTN; HLD; chronic low back pain; CHF; and afib  who presented on 08/29/2018 with Syncope related to symptomatic bradycardia (second-degree AV block) atrial flutter with slow ventricular rate requiring pacemaker placement.  Feels well this afternoon No chest pain, some slight discomfort in chest at pacemaker site Some mild neck pain  Objective: Vitals:   09/01/18 1700 09/01/18 2054  BP: (!) 164/72 (!) 158/62  Pulse: 63 (!) 49  Resp: 16 17  Temp: (!) 97.4 F (36.3 C) 98.8 F (37.1 C)  SpO2: 100% 93%    Intake/Output Summary (Last 24 hours) at 09/01/2018 2132 Last data filed at 09/01/2018 1700 Gross per 24 hour  Intake 1159.61 ml  Output 1650 ml  Net -490.39 ml   Filed Weights   08/29/18 1122 08/30/18 0605 08/31/18 0500  Weight: 114.3 kg 112.8 kg 118.8 kg    Exam:   General: Elderly female lying in bed, no distress  Cardiovascular: Irregularly irregular rhythm, normal rate  Respiratory: Normal respiratory effort on room air  Musculoskeletal: Cervical collar in place  Skin pacemaker in place with dressing overlying in left chest  Neurologic alert and oriented x4  Data Reviewed: Basic Metabolic Panel: Recent Labs  Lab 08/29/18 1248 08/30/18 0319 09/01/18 0344  NA 135 138 138  K 5.6* 5.1 5.1  CL 103 107 105  CO2 22 23 23   GLUCOSE 174* 145* 117*  BUN 64* 61* 47*  CREATININE 2.24* 2.23* 1.87*  CALCIUM 9.1 8.7* 8.9   Liver Function Tests: Recent Labs  Lab 08/29/18 1248  AST 39  ALT 27  ALKPHOS 55  BILITOT 0.9  PROT 6.3*  ALBUMIN 3.9   No results for input(s): LIPASE, AMYLASE in the last 168 hours. No results for input(s): AMMONIA in the last 168 hours. CBC: Recent Labs  Lab 08/29/18 1248 08/30/18 0319 09/01/18 0344  WBC 7.5 7.0 6.6  NEUTROABS 6.3  --   --   HGB 10.7* 9.5* 9.6*  HCT 34.0* 29.7* 30.9*  MCV 101.5* 100.0 102.0*  PLT 140* 117* 103*    Cardiac Enzymes: Recent Labs  Lab 08/30/18 1138 08/30/18 1644 08/30/18 2319  TROPONINI 0.18* 0.16* 0.11*   BNP (last 3 results) No results for input(s): BNP in the last 8760 hours.  ProBNP (last 3 results) No results for input(s): PROBNP in the last 8760 hours.  CBG: Recent Labs  Lab 08/31/18 1441 08/31/18 1520 08/31/18 2210 09/01/18 0914 09/01/18 1133  GLUCAP 99 87 116* 93 107*    Recent Results (from the past 240 hour(s))  SARS Coronavirus 2 (CEPHEID - Performed in [facility]), Hosp Order     Status: None   Collection Time: 08/29/18  1:12 PM  Result Value Ref Range Status   SARS Coronavirus 2 NEGATIVE NEGATIVE Final    Comment: (NOTE) If result is NEGATIVE SARS-CoV-2 target nucleic acids are NOT DETECTED. The SARS-CoV-2 RNA is generally detectable in upper and lower  respiratory specimens during the acute phase of infection. The lowest  concentration of SARS-CoV-2 viral copies this assay can detect is 250  copies / mL. A negative result does not preclude SARS-CoV-2 infection  and should not be used as the sole basis for treatment or other  patient management decisions.  A negative result may occur with  improper specimen collection / handling, submission of specimen other  than nasopharyngeal swab, presence of viral mutation(s) within the  areas targeted by this assay, and inadequate number of viral copies  (<250 copies / mL). A negative result must be combined with clinical  observations, patient history, and epidemiological information. If result is POSITIVE SARS-CoV-2 target nucleic acids are DETECTED. The SARS-CoV-2 RNA is generally detectable in upper and lower  respiratory specimens dur ing the acute phase of infection.  Positive  results are indicative of active infection with SARS-CoV-2.  Clinical  correlation with patient history and other diagnostic information is  necessary to determine patient infection status.  Positive results do   not rule out bacterial infection or co-infection with other viruses. If result is PRESUMPTIVE POSTIVE SARS-CoV-2 nucleic acids MAY BE PRESENT.   A presumptive positive result was obtained on the submitted specimen  and confirmed on repeat testing.  While 2019 novel coronavirus  (SARS-CoV-2) nucleic acids may be present in the submitted sample  additional confirmatory testing may be necessary for epidemiological  and / or clinical management purposes  to differentiate between  SARS-CoV-2 and other Sarbecovirus currently known to infect humans.  If clinically indicated additional testing with an alternate test  methodology [phone]) is advised. The SARS-CoV-2 RNA is generally  detectable in upper and lower respiratory sp ecimens during the acute  phase of infection. The expected result is Negative. Fact Sheet for Patients:  StrictlyIdeas.no Fact Sheet for Healthcare Providers: BankingDealers.co.za This test is not yet approved or cleared by the [place] FDA and has been authorized for detection and/or diagnosis of SARS-CoV-2 by FDA under an Emergency Use Authorization (EUA).  This EUA will remain in effect (meaning this test can be used) for the duration of the COVID-19 declaration under Section 564(b)(1) of the Act, 21 U.S.C. section 360bbb-3(b)(1), unless  the authorization is terminated or revoked sooner. Performed at [facility], [place] [address] [place], [place] [address]   MRSA PCR Screening     Status: None   Collection Time: 08/30/18 10:27 AM  Result Value Ref Range Status   MRSA by PCR NEGATIVE NEGATIVE Final    Comment:        The GeneXpert MRSA Assay (FDA approved for NASAL specimens only), is one component of a comprehensive MRSA colonization surveillance program. It is not intended to diagnose MRSA infection nor to guide or monitor treatment for MRSA infections. Performed at [facility], [place]  [address] [place], [place] [address]      Studies: Dg Chest 2 View  Result Date: 09/01/2018 CLINICAL DATA:  Permanent pacemaker placement. EXAM: CHEST - 2 VIEW COMPARISON:  05/04/2016 FINDINGS: Left-sided single lead permanent pacemaker is new in the interval. No evidence for pneumothorax. The cardio pericardial silhouette is enlarged. There is pulmonary vascular congestion without overt pulmonary edema. No focal airspace consolidation. IMPRESSION: No evidence for pneumothorax status post left permanent pacemaker placement. No pleural effusion. Electronically Signed   By: Misty Stanley M.D.   On: 09/01/2018 08:23    Scheduled Meds: . allopurinol  300 mg Oral Daily  . citalopram  20 mg Oral QHS  . insulin aspart  0-5 Units Subcutaneous QHS  . insulin aspart  0-9 Units Subcutaneous TID WC  . insulin detemir  16 Units Subcutaneous QAC breakfast  . isosorbide mononitrate  120 mg Oral Daily  . levothyroxine  50 mcg Oral Q0600  . Melatonin  6 mg Oral QHS  . mupirocin cream   Topical BID  . rosuvastatin  10 mg Oral q1800  . sodium chloride flush  3 mL Intravenous Q12H  . Warfarin - Pharmacist Dosing Inpatient   Does not apply q1800   Continuous Infusions: . sodium chloride 10 mL/hr at 09/01/18 0200    Principal Problem:   Syncope Active Problems:   IDDM (insulin dependent diabetes mellitus) (HCC)   HTN (hypertension)   HLD (hyperlipidemia)   Diastolic dysfunction, grade 2, EF 65-70% May 2013   Chronic renal insufficiency, stage IV (severe) - due to DM & HTN, SCr improving-1.6   Anticoagulated on Coumadin, chronically   Symptomatic advanced heart block      Desiree Hane  [facility]

## 2018-09-02 ENCOUNTER — Inpatient Hospital Stay (HOSPITAL_COMMUNITY): Payer: Medicare HMO

## 2018-09-02 DIAGNOSIS — I4891 Unspecified atrial fibrillation: Secondary | ICD-10-CM

## 2018-09-02 DIAGNOSIS — M542 Cervicalgia: Secondary | ICD-10-CM

## 2018-09-02 LAB — GLUCOSE, CAPILLARY
Glucose-Capillary: 121 mg/dL — ABNORMAL HIGH (ref 70–99)
Glucose-Capillary: 113 mg/dL — ABNORMAL HIGH (ref 70–99)
Glucose-Capillary: 127 mg/dL — ABNORMAL HIGH (ref 70–99)
Glucose-Capillary: 109 mg/dL — ABNORMAL HIGH (ref 70–99)

## 2018-09-02 LAB — PROTIME-INR
INR: 1.8 — ABNORMAL HIGH (ref 0.8–1.2)
Prothrombin Time: 20.6 seconds — ABNORMAL HIGH (ref 11.4–15.2)

## 2018-09-02 MED ORDER — CYCLOBENZAPRINE HCL 10 MG PO TABS
5.0000 mg | ORAL_TABLET | Freq: Three times a day (TID) | ORAL | Status: DC | PRN
  Administered 2018-09-02 – 2018-09-03 (×2): 5 mg via ORAL
  Filled 2018-09-02 (×2): qty 1

## 2018-09-02 MED ORDER — WARFARIN SODIUM 5 MG PO TABS
5.0000 mg | ORAL_TABLET | Freq: Once | ORAL | Status: AC
  Administered 2018-09-02: 17:00:00 5 mg via ORAL
  Filled 2018-09-02: qty 1

## 2018-09-02 NOTE — Care Management Important Message (Signed)
Important Message  Patient Details  Name: [name] MRN: [id number] Date of Birth: 04-24-1955   Medicare Important Message Given:  Yes    Deanie Jupiter 09/02/2018, 4:20 PM

## 2018-09-02 NOTE — Progress Notes (Signed)
[place] for Warfarin Indication: atrial fibrillation  Allergies  Allergen Reactions  . Morphine And Related Rash    Patient Measurements: Height: 5\' 2"  (157.5 cm) Weight: 270 lb 11.6 oz (122.8 kg) IBW/kg (Calculated) : 50.1 Heparin Dosing Weight: 78 kg  Vital Signs: Temp: 98.7 F (37.1 C) (05/28 0638) Temp Source: Oral (05/28 7048) BP: 167/71 (05/28 8891) Pulse Rate: 60 (05/28 0638)  Labs: Recent Labs    08/30/18 1138 08/30/18 1644 08/30/18 2319 08/31/18 0236 09/01/18 0344 09/02/18 0327  HGB  --   --   --   --  9.6*  --   HCT  --   --   --   --  30.9*  --   PLT  --   --   --   --  103*  --   LABPROT  --   --   --  25.1* 24.9* 20.6*  INR  --   --   --  2.3* 2.3* 1.8*  CREATININE  --   --   --   --  1.87*  --   TROPONINI 0.18* 0.16* 0.11*  --   --   --     Estimated Creatinine Clearance: 39 mL/min (A) (by C-G formula based on SCr of 1.87 mg/dL (H)).   Assessment: 32 YOF who [facility] the [facility] with syncope/seizures. The patient was on warfarin PTA for hx Afib  INR 1.8 today - to keep less than 2.5 to prevent bleeding with recent pacemaker  PTA regimen 5 mg daily except 7.5 mg on Sun/Tues/Thurs.   Goal of Therapy:  Heparin level 0.3-0.7 units/ml Monitor platelets by anticoagulation protocol: Yes   Plan:  Warfarin 5 mg po x 1 dose today Daily INR  Thank you Anette Guarneri, PharmD [phone] 09/02/2018 8:46 AM   **Pharmacist phone directory can now be found on [email or web address] ([email or web address]).  Listed under [place].

## 2018-09-02 NOTE — Progress Notes (Signed)
Physical Therapy Treatment Patient Details Name: [name] MRN: [id number] DOB: 09-22-1955 Today's Date: 09/02/2018    History of Present Illness  63 y.o. female with PMHx including DVT/PE (2014); OSA; hypothyroidism; HTN; HLD; chronic low back pain; CHF; and afib on Coumadin now presenting with syncope and x2 falls prior to EMS arrival.  CT C-spine revealed Left C6 unilateral facet fracture, right C7 unilateral facet fracture with possible extension into the right C7 pedicle, currently being treated non-operatively. Pt is now s/p pacemaker placement on 5/26 secondary to symptomatic bradycardia.     PT Comments    Despite increased neck pain earlier in day, pt reports pain is less and she is eager to get out of the bed. Pt is making good progress towards her goals, however continues to be limited in safe mobility by NWB through L UE, and decreased motion in cervical spine due to her hard collar. Pt is very determined to get back to PLOF. Pt requires minA for bed mobility, min guard     Follow Up Recommendations  Home health PT;Supervision/Assistance - 24 hour     Equipment Recommendations  Rolling walker with 5" wheels    Recommendations for Other Services       Precautions / Restrictions Precautions Precautions: Cervical;Fall;ICD/Pacemaker Precaution Booklet Issued: No Precaution Comments: verbally reviewed pacemaker, cervical precautions Required Braces or Orthoses: Cervical Brace Cervical Brace: Hard collar;At all times Restrictions Weight Bearing Restrictions: Yes LUE Weight Bearing: Non weight bearing(pacemaker)    Mobility  Bed Mobility Overal bed mobility: Needs Assistance Bed Mobility: Supine to Sit     Supine to sit: Min assist     General bed mobility comments: HOB all the way up, pt preferred to swing LE around and pull against therapist  to come to sitting, once in sitting insisted on independence to scoot hips to EoB despite increased  effort  Transfers Overall transfer level: Needs assistance Equipment used: 2 person hand held assist Transfers: Sit to/from Stand Sit to Stand: Min assist         General transfer comment: min assist for boost to upright, pt able to self steady with increased time, improved power up from higher [facility]  Ambulation/Gait Ambulation/Gait assistance: +2 safety/equipment;Min guard(chair follow ultimately not needed) Gait Distance (Feet): 450 Feet Assistive device: Rolling walker (2 wheeled) Gait Pattern/deviations: Step-through pattern;Decreased stride length Gait velocity: Decreased  Gait velocity interpretation: 1.31 - 2.62 ft/sec, indicative of limited community ambulator General Gait Details: Slow, waddle type gait. pt requested RW, and was able to rest L UE on hand rest to guide and push with R UE         Balance Overall balance assessment: Needs assistance Sitting-balance support: Feet supported Sitting balance-[name]: Fair     Standing balance support: Bilateral upper extremity supported Standing balance-[name]: Poor Standing balance comment: reliant on UE support at this time                            Cognition Arousal/Alertness: Awake/alert Behavior During Therapy: WFL for tasks assessed/performed Overall Cognitive Status: Within Functional Limits for tasks assessed                                           General Comments General comments (skin integrity, edema, etc.): VSS, tightened cervical collar to provide support and reduce pain  also provided maximal cuing for turning trunk not neck to see side to side      Pertinent Vitals/Pain Pain Assessment: Faces Faces Pain Scale: Hurts little more Pain Location: LUE with certain movements Pain Descriptors / Indicators: Discomfort;Sore Pain Intervention(s): Limited activity within patient's tolerance;Monitored during session;Premedicated before session;Repositioned     PT  Goals (current goals can now be found in the care plan section) Acute Rehab PT Goals Patient Stated Goal: regain independence, return home PT Goal Formulation: With patient Time For Goal Achievement: 09/15/18 Potential to Achieve Goals: Good    Frequency    Min 3X/week      PT Plan Current plan remains appropriate       AM-PAC PT "6 Clicks" Mobility   Outcome Measure  Help needed turning from your back to your side while in a flat bed without using bedrails?: A Lot Help needed moving from lying on your back to sitting on the side of a flat bed without using bedrails?: A Lot Help needed moving to and from a bed to a chair (including a wheelchair)?: A Little Help needed standing up from a chair using your arms (e.g., wheelchair or bedside chair)?: A Little Help needed to walk in hospital room?: A Little Help needed climbing 3-5 steps with a railing? : A Lot 6 Click Score: 15    End of Session Equipment Utilized During Treatment: Gait belt;Cervical collar Activity Tolerance: Patient tolerated treatment well Patient left: in chair;with call bell/phone within reach;with chair alarm set Nurse Communication: Mobility status PT Visit Diagnosis: Unsteadiness on feet (R26.81);Muscle weakness (generalized) (M62.81);History of falling (Z91.81)     Time: 8016-5537 PT Time Calculation (min) (ACUTE ONLY): 21 min  Charges:  $Gait Training: 8-22 mins                     Callahan Peddie B. Migdalia Dk PT, DPT Acute Rehabilitation Services Pager [phone] Office [phone]    [place] 09/02/2018, 5:20 PM

## 2018-09-02 NOTE — Progress Notes (Signed)
Patient having new 10/10 neck pain. Patient stated this is new and she is unable to turn her neck to the left or right without sharp stabbing. Repositioned patient, this gave some relief of pressure, but she stated it did not help the pain. Gave patient oxycodone at 0745. Paged MD, will continue you monitor.

## 2018-09-02 NOTE — Progress Notes (Signed)
[facility] PROGRESS NOTE  [name] WVP:[id number] DOB: 01/24/1956 DOA: 08/29/2018 PCP: [name], MD  Assessment/Plan: Syncope with seizure-like activity presumed secondary to symptomatic bradycardia related to second-degree AV block.  Status post pacemaker placement on 5/26.  Heart rate doing well in the 60s.  Follow-up with EP cardiology arranged for discharge, as well as device check/wound check.  Tegaderm dressing needs to be removed prior to discharge and keep Steri-Strips in place per cardiology  Atrial flutter with slow ventricular rate.  Rate now in the 60s as mentioned above.  Status post pacemaker, not on any nodal blocking agents here.  Continue warfarin for a flutter and previous history of DVT or current PE.  Maintain INR (1.8 today, goal < 2.5 to prevent bleeding from surgical site)  Minimally displaced C6 fracture.  Had worsening neck pain today, repeat XR showed no changes to fracture. Added flexeril for muscle spasm relief, monitor additional 24 hours if no worsening neck pain/weakness planned discharge on 5/29. Evaluated by Dr. Venetia Constable on admission with neurosurgery who recommends continuing hard cervical collar.  Will follow-up with outpatient imaging as patient is not a candidate for surgical intervention.  Neurosurgery will follow-up in 2 weeks.  CKD stage IV, creatinine improving and getting closer to baseline (1.4-1.7).  Avoid nephrotoxins, monitor BMP, monitor output  Depression, stable continue home Celexa  Hyperlipidemia, stable continue Crestor  Hypothyroidism, stable continue Synthroid  CAD stable, no chest pain.  Continue Imdur  Type 2 diabetes.  Continue home Levemir, sliding scale as needed, monitor CBG  Gout, stable continue allopurinol   Code Status: Full code Family Communication: No family at bedside (indicate person spoken with, relationship, and if by phone, the number) Disposition Plan: Had worsening neck pain today, repeat XR  showed no changes to fracture. Added flexeril for muscle spasm relief, monitor additional 24 hours if no worsening neck pain/weakness planned discharge on 5/29.monitor dressing site while on warfarin,.   Consultants:  Cardiology/EP  Procedures:  Pacemaker placement 5/26     HPI/Subjective:  [name] is a 63 y.o. year old female with medical history significant for DM; DVT/PE (2014); OSA; hypothyroidism; HTN; HLD; chronic low back pain; CHF; and afib  who presented on 08/29/2018 with Syncope related to symptomatic bradycardia (second-degree AV block) atrial flutter with slow ventricular rate requiring pacemaker placement.  Had acute intense episode of neck pain and inability to move laterally No new weakness Has since improved over the course of the day XR of cervical spine shows no change in known fracture  Objective: Vitals:   09/02/18 0638 09/02/18 1300  BP: (!) 167/71 (!) 126/59  Pulse: 60 77  Resp: 18 16  Temp: 98.7 F (37.1 C) 98 F (36.7 C)  SpO2: 94% 95%    Intake/Output Summary (Last 24 hours) at 09/02/2018 1757 Last data filed at 09/02/2018 1619 Gross per 24 hour  Intake 240 ml  Output 1180 ml  Net -940 ml   Filed Weights   08/30/18 0605 08/31/18 0500 09/02/18 5462  Weight: 112.8 kg 118.8 kg 122.8 kg    Exam:   General: Elderly female lying in bed, no distress  Cardiovascular: Irregularly irregular rhythm, normal rate  Respiratory: Normal respiratory effort on room air  Musculoskeletal: Hard cervical collar in place, limited rom of neck  Skin pacemaker in place with dressing overlying in left chest  Neurologic alert and oriented x4, no neurologic defictis  Data Reviewed: Basic Metabolic Panel: Recent Labs  Lab 08/29/18 1248 08/30/18 0319  09/01/18 0344  NA 135 138 138  K 5.6* 5.1 5.1  CL 103 107 105  CO2 22 23 23   GLUCOSE 174* 145* 117*  BUN 64* 61* 47*  CREATININE 2.24* 2.23* 1.87*  CALCIUM 9.1 8.7* 8.9   Liver Function  Tests: Recent Labs  Lab 08/29/18 1248  AST 39  ALT 27  ALKPHOS 55  BILITOT 0.9  PROT 6.3*  ALBUMIN 3.9   No results for input(s): LIPASE, AMYLASE in the last 168 hours. No results for input(s): AMMONIA in the last 168 hours. CBC: Recent Labs  Lab 08/29/18 1248 08/30/18 0319 09/01/18 0344  WBC 7.5 7.0 6.6  NEUTROABS 6.3  --   --   HGB 10.7* 9.5* 9.6*  HCT 34.0* 29.7* 30.9*  MCV 101.5* 100.0 102.0*  PLT 140* 117* 103*   Cardiac Enzymes: Recent Labs  Lab 08/30/18 1138 08/30/18 1644 08/30/18 2319  TROPONINI 0.18* 0.16* 0.11*   BNP (last 3 results) No results for input(s): BNP in the last 8760 hours.  ProBNP (last 3 results) No results for input(s): PROBNP in the last 8760 hours.  CBG: Recent Labs  Lab 09/01/18 1133 09/01/18 2234 09/02/18 0751 09/02/18 1147 09/02/18 1651  GLUCAP 107* 140* 121* 113* 127*    Recent Results (from the past 240 hour(s))  SARS Coronavirus 2 (CEPHEID - Performed in [place] hospital lab), Hosp Order     Status: None   Collection Time: 08/29/18  1:12 PM  Result Value Ref Range Status   SARS Coronavirus 2 NEGATIVE NEGATIVE Final    Comment: (NOTE) If result is NEGATIVE SARS-CoV-2 target nucleic acids are NOT DETECTED. The SARS-CoV-2 RNA is generally detectable in upper and lower  respiratory specimens during the acute phase of infection. The lowest  concentration of SARS-CoV-2 viral copies this assay can detect is 250  copies / mL. A negative result does not preclude SARS-CoV-2 infection  and should not be used as the sole basis for treatment or other  patient management decisions.  A negative result may occur with  improper specimen collection / handling, submission of specimen other  than nasopharyngeal swab, presence of viral mutation(s) within the  areas targeted by this assay, and inadequate number of viral copies  (<250 copies / mL). A negative result must be combined with clinical  observations, patient history, and  epidemiological information. If result is POSITIVE SARS-CoV-2 target nucleic acids are DETECTED. The SARS-CoV-2 RNA is generally detectable in upper and lower  respiratory specimens dur ing the acute phase of infection.  Positive  results are indicative of active infection with SARS-CoV-2.  Clinical  correlation with patient history and other diagnostic information is  necessary to determine patient infection status.  Positive results do  not rule out bacterial infection or co-infection with other viruses. If result is PRESUMPTIVE POSTIVE SARS-CoV-2 nucleic acids MAY BE PRESENT.   A presumptive positive result was obtained on the submitted specimen  and confirmed on repeat testing.  While 2019 novel coronavirus  (SARS-CoV-2) nucleic acids may be present in the submitted sample  additional confirmatory testing may be necessary for epidemiological  and / or clinical management purposes  to differentiate between  SARS-CoV-2 and other Sarbecovirus currently known to infect humans.  If clinically indicated additional testing with an alternate test  methodology [phone]) is advised. The SARS-CoV-2 RNA is generally  detectable in upper and lower respiratory sp ecimens during the acute  phase of infection. The expected result is Negative. Fact Sheet for Patients:  StrictlyIdeas.no Fact Sheet for Healthcare Providers: BankingDealers.co.za This test is not yet approved or cleared by the [place] FDA and has been authorized for detection and/or diagnosis of SARS-CoV-2 by FDA under an Emergency Use Authorization (EUA).  This EUA will remain in effect (meaning this test can be used) for the duration of the COVID-19 declaration under Section 564(b)(1) of the Act, 21 U.S.C. section 360bbb-3(b)(1), unless the authorization is terminated or revoked sooner. Performed at [facility], [place] [address], [place], [place] [address]   MRSA PCR  Screening     Status: None   Collection Time: 08/30/18 10:27 AM  Result Value Ref Range Status   MRSA by PCR NEGATIVE NEGATIVE Final    Comment:        The GeneXpert MRSA Assay (FDA approved for NASAL specimens only), is one component of a comprehensive MRSA colonization surveillance program. It is not intended to diagnose MRSA infection nor to guide or monitor treatment for MRSA infections. Performed at [facility], [place] [address] [place], [place] [address]      Studies: Dg Chest 2 View  Result Date: 09/01/2018 CLINICAL DATA:  Permanent pacemaker placement. EXAM: CHEST - 2 VIEW COMPARISON:  05/04/2016 FINDINGS: Left-sided single lead permanent pacemaker is new in the interval. No evidence for pneumothorax. The cardio pericardial silhouette is enlarged. There is pulmonary vascular congestion without overt pulmonary edema. No focal airspace consolidation. IMPRESSION: No evidence for pneumothorax status post left permanent pacemaker placement. No pleural effusion. Electronically Signed   By: Misty Stanley M.D.   On: 09/01/2018 08:23   Dg Cervical Spine 2 Or 3 Views  Result Date: 09/02/2018 CLINICAL DATA:  63 year old female with a history of neck pain. Prior CT 08/29/2018 demonstrating a left C6 nondisplaced facet fracture, right C7 nondisplaced pedicle fracture/facet fracture. EXAM: CERVICAL SPINE - 2-3 VIEW COMPARISON:  CT 08/29/2018 FINDINGS: Alignment is unchanged from the prior. Relative straightening of the normal cervical lordosis. Craniocervical junction aligned. Vertebral body heights maintained. Advanced disc disease spanning C3-C7 with disc space narrowing, sclerotic endplate changes, narrowing, uncovertebral joint disease, and anterior osteophyte production. No new malalignment/anterolisthesis/retrolisthesis. The fracture lines previously identified on CT are not visualized on the current plain film. No new acute fracture identified. Anterior view demonstrates bilateral  facet hypertrophy. Left carotid calcifications. IMPRESSION: No new acute bony abnormality or malalignment. The previously identified nondisplaced left C6 facet and right C7 facet/pedicle fracture are not well visualized. Advanced disc disease spanning C3-C7. Electronically Signed   By: Corrie Mckusick D.O.   On: 09/02/2018 10:57    Scheduled Meds: . allopurinol  300 mg Oral Daily  . citalopram  20 mg Oral QHS  . insulin aspart  0-5 Units Subcutaneous QHS  . insulin aspart  0-9 Units Subcutaneous TID WC  . insulin detemir  16 Units Subcutaneous QAC breakfast  . isosorbide mononitrate  120 mg Oral Daily  . levothyroxine  50 mcg Oral Q0600  . Melatonin  6 mg Oral QHS  . mupirocin cream   Topical BID  . rosuvastatin  10 mg Oral q1800  . sodium chloride flush  3 mL Intravenous Q12H  . Warfarin - Pharmacist Dosing Inpatient   Does not apply q1800   Continuous Infusions: . sodium chloride 10 mL/hr at 09/01/18 0200    Principal Problem:   Syncope Active Problems:   IDDM (insulin dependent diabetes mellitus) (HCC)   HTN (hypertension)   HLD (hyperlipidemia)   History of pulmonary embolism, recurrent, 3 seperate  episodes.   Diastolic dysfunction, grade 2, EF 65-70% May 2013   Chronic renal insufficiency, stage IV (severe) - due to DM & HTN, SCr improving-1.6   Anticoagulated on Coumadin, chronically   Symptomatic advanced heart block   C6 cervical fracture (HCC)   Atrial flutter ([place])   Unspecified atrial fibrillation ([place])      Desiree Hane  [facility]

## 2018-09-03 DIAGNOSIS — R001 Bradycardia, unspecified: Secondary | ICD-10-CM

## 2018-09-03 LAB — GLUCOSE, CAPILLARY
Glucose-Capillary: 89 mg/dL (ref 70–99)
Glucose-Capillary: 102 mg/dL — ABNORMAL HIGH (ref 70–99)

## 2018-09-03 LAB — BASIC METABOLIC PANEL
Anion gap: 6 (ref 5–15)
BUN: 45 mg/dL — ABNORMAL HIGH (ref 8–23)
CO2: 26 mmol/L (ref 22–32)
Calcium: 9.3 mg/dL (ref 8.9–10.3)
Chloride: 107 mmol/L (ref 98–111)
Creatinine, Ser: 1.64 mg/dL — ABNORMAL HIGH (ref 0.44–1.00)
GFR calc Af Amer: 38 mL/min — ABNORMAL LOW (ref >60)
GFR calc non Af Amer: 33 mL/min — ABNORMAL LOW (ref >60)
Glucose, Bld: 106 mg/dL — ABNORMAL HIGH (ref 70–99)
Potassium: 5.2 mmol/L — ABNORMAL HIGH (ref 3.5–5.1)
Sodium: 139 mmol/L (ref 135–145)

## 2018-09-03 LAB — CBC
HCT: 32 % — ABNORMAL LOW (ref 36.0–46.0)
Hemoglobin: 9.9 g/dL — ABNORMAL LOW (ref 12.0–15.0)
MCH: 31.5 pg (ref 26.0–34.0)
MCHC: 30.9 g/dL (ref 30.0–36.0)
MCV: 101.9 fL — ABNORMAL HIGH (ref 80.0–100.0)
Platelets: 96 10*3/uL — ABNORMAL LOW (ref 150–400)
RBC: 3.14 MIL/uL — ABNORMAL LOW (ref 3.87–5.11)
RDW: 14.6 % (ref 11.5–15.5)
WBC: 5.9 10*3/uL (ref 4.0–10.5)
nRBC: 0 % (ref 0.0–0.2)

## 2018-09-03 LAB — PROTIME-INR
INR: 1.7 — ABNORMAL HIGH (ref 0.8–1.2)
Prothrombin Time: 19.9 seconds — ABNORMAL HIGH (ref 11.4–15.2)

## 2018-09-03 MED ORDER — CYCLOBENZAPRINE HCL 5 MG PO TABS: 5.0000 mg | ORAL_TABLET | Freq: Three times a day (TID) | ORAL | 0 refills | Status: DC | PRN

## 2018-09-03 MED ORDER — WARFARIN SODIUM 5 MG PO TABS: 5.0000 mg | ORAL_TABLET | Freq: Once | ORAL | Status: DC

## 2018-09-03 MED ORDER — OXYCODONE-ACETAMINOPHEN 5-325 MG PO TABS: 1.0000 | ORAL_TABLET | Freq: Four times a day (QID) | ORAL | 0 refills | Status: AC | PRN

## 2018-09-03 MED ORDER — MUPIROCIN CALCIUM 2 % EX CREA: TOPICAL_CREAM | Freq: Two times a day (BID) | CUTANEOUS | 0 refills | Status: DC

## 2018-09-03 MED ORDER — HYDRALAZINE HCL 25 MG PO TABS: ORAL_TABLET | ORAL | Status: DC

## 2018-09-03 MED ORDER — TORSEMIDE 20 MG PO TABS: 40.0000 mg | ORAL_TABLET | Freq: Every day | ORAL | 0 refills | Status: DC | PRN

## 2018-09-03 NOTE — Progress Notes (Signed)
[place] for Warfarin Indication: atrial fibrillation  Allergies  Allergen Reactions  . Morphine And Related Rash    Patient Measurements: Height: 5\' 2"  (157.5 cm) Weight: 265 lb 11.2 oz (120.5 kg) IBW/kg (Calculated) : 50.1 Heparin Dosing Weight: 78 kg  Vital Signs: Temp: 97.8 F (36.6 C) (05/29 0631) Temp Source: Oral (05/29 0631) BP: 133/54 (05/29 0631) Pulse Rate: 61 (05/29 0631)  Labs: Recent Labs    09/01/18 0344 09/02/18 0327 09/03/18 0444 09/03/18 0535  HGB 9.6*  --   --  9.9*  HCT 30.9*  --   --  32.0*  PLT 103*  --   --  96*  LABPROT 24.9* 20.6* 19.9*  --   INR 2.3* 1.8* 1.7*  --   CREATININE 1.87*  --  1.64*  --     Estimated Creatinine Clearance: 44 mL/min (A) (by C-G formula based on SCr of 1.64 mg/dL (H)).   Assessment: 3 YOF who presented to the [facility] on 5/24 with syncope/seizures. The patient was on warfarin PTA for hx Afib.  INR 1.7 today - requested to keep less than 2.5 to prevent bleeding with recent pacemaker.  PTA regimen 5 mg daily except 7.5 mg on Sun/Tues/Thurs.   Goal of Therapy:  2-2.5 Monitor platelets by anticoagulation protocol: Yes   Plan:  Warfarin 5 mg po tonight Daily INR Monitor for s/sx of bleeding   Thank you for involving pharmacy in this patient's care.  Renold Genta, PharmD, BCPS Clinical Pharmacist Clinical phone for 09/03/2018 until 3p is [phone] 09/03/2018 8:46 AM  **Pharmacist phone directory can be found on [email or web address] listed under [place]**

## 2018-09-03 NOTE — Discharge Summary (Signed)
Discharge Summary  [name] FBP:[id number] DOB: 28-Dec-1955  PCP: [name], MD  Admit date: 08/29/2018 Discharge date: 09/03/2018   Time spent: < 25 minutes  Admitted From: home Disposition:  home  Recommendations for Outpatient Follow-up:  1. Follow up with PCP in 1 week, for INR check, BMP(K,Cr) 2. Medication changes: instructed to hold hydralazine, spirinolactone, lisinopril until seen by PCP. Continue warfarin dosing and have INR check in week 3. Short script for norco provided on discharge given acuity of cervical fracture as well as flexeril 4.     Discharge Diagnoses:  Active Hospital Problems   Diagnosis Date Noted   Syncope 08/29/2018   C6 cervical fracture ([place]) 09/01/2018   Atrial flutter ([place]) 09/01/2018   Unspecified atrial fibrillation ([place]) 09/01/2018   Symptomatic advanced heart block 08/29/2018   Anticoagulated on Coumadin, chronically 09/03/2011   Chronic renal insufficiency, stage IV (severe) - due to DM & HTN, SCr improving-1.6 08/14/2011    Class: Chronic   Diastolic dysfunction, grade 2, EF 65-70% May 2013 08/14/2011   HTN (hypertension) 08/13/2011   HLD (hyperlipidemia) 08/13/2011   IDDM (insulin dependent diabetes mellitus) ([place]) 08/13/2011   History of pulmonary embolism, recurrent, 3 seperate episodes. 08/13/2011    Resolved Hospital Problems  No resolved problems to display.    Discharge Condition: Stable   CODE STATUS:FULL    History of present illness:  [name] is a 63 y.o. year old female with medical history significant for DM; DVT/PE (2014); OSA; hypothyroidism; HTN; HLD; chronic low back pain; CHF; and afib  who presented on 08/29/2018 with Syncope related to symptomatic bradycardia (second-degree AV block) atrial flutter with slow ventricular rate requiring pacemaker placement.. Remaining hospital course addressed in problem based format below:   Hospital Course:   Syncope with seizure-like activity  presumed secondary to symptomatic bradycardia related to second-degree AV block.  Status post pacemaker placement on 5/26.  Heart rate doing well in the 60s.  Follow-up with EP cardiology arranged for discharge, as well as device check/wound check.  Tegaderm dressing needs to be removed prior to discharge and keep Steri-Strips in place per cardiology  Atrial flutter with slow ventricular rate.  Rate now in the 60s as mentioned above on discharge.  Status post pacemaker, not on any nodal blocking agents here.  Continue warfarin for a flutter and previous history of DVT or current PE.  Maintain INR (1.8 today, goal < 2.5 to prevent bleeding from surgical site)She will follow up in INR clinic. Of note she does have history of DVT/PE  Minimally displaced C6 fracture. . Evaluated by Dr. Venetia Constable on admission with neurosurgery who recommends continuing hard cervical collar.  Will follow-up with outpatient imaging as patient is not a candidate for surgical intervention.  Neurosurgery will follow-up in 2 weeks. Flexeril for PRN spasms. Did have worsening neck pain day prior to discharge but resolved with flexeril, Xr of cervical spine showed no change in fracture and she remained neurologically intact  CKD stage IV, Peak of 2.23 on admission. Improved and returned to baseline with creatinine of 1.64 on discharger. Baseline (1.4-1.7).  Repeat BMP as outpatient  Depression, stable continue home Celexa  Hyperlipidemia, stable continue Crestor  Hypothyroidism, stable continue Synthroid  CAD stable, no chest pain.  Continue Imdur  Type 2 diabetes.  Continue home Levemir,   Gout, stable continue allopurinol   Consultations:  Cardiology  Procedures/Studies:  Pacemaker placement 5/26  Discharge Exam: BP (!) 133/54 (BP Location: Right Arm)  Pulse 61    Temp 97.8 F (36.6 C) (Oral)    Resp 16    Ht 5\' 2"  (1.575 m)    Wt 120.5 kg    SpO2 98%    BMI 48.60 kg/m    General: Elderly female  lying in bed, no distress  Cardiovascular: Irregularly irregular rhythm, normal rate  Respiratory: Normal respiratory effort on room air  Musculoskeletal: Hard cervical collar in place, limited rom of neck  Skin pacemaker in place with dressing overlying in left chest  Neurologic alert and oriented x4, no neurologic deficits   Discharge Instructions You were cared for by a hospitalist during your hospital stay. If you have any questions about your discharge medications or the care you received while you were in the hospital after you are discharged, you can call the unit and asked to speak with the hospitalist on call if the hospitalist that took care of you is not available. Once you are discharged, your primary care physician will handle any further medical issues. Please note that NO REFILLS for any discharge medications will be authorized once you are discharged, as it is imperative that you return to your primary care physician (or establish a relationship with a primary care physician if you do not have one) for your aftercare needs so that they can reassess your need for medications and monitor your lab values.  Discharge Instructions    Diet - low sodium heart healthy   Complete by:  As directed    Increase activity slowly   Complete by:  As directed      Allergies as of 09/03/2018      Reactions   Morphine And Related Rash      Medication List    STOP taking these medications   aspirin EC 81 MG tablet   HYDROcodone-acetaminophen 5-325 MG tablet Commonly known as:  NORCO/VICODIN   lisinopril 10 MG tablet Commonly known as:  ZESTRIL   spironolactone 25 MG tablet Commonly known as:  ALDACTONE     TAKE these medications   acetaminophen 500 MG tablet Commonly known as:  TYLENOL Take 500 mg by mouth every 6 (six) hours as needed for fever or headache (pain).   allopurinol 300 MG tablet Commonly known as:  ZYLOPRIM Take 300 mg by mouth daily.   Biotin 1000 MCG  tablet Take 1,000 mcg by mouth daily with lunch.   citalopram 20 MG tablet Commonly known as:  CELEXA Take 20 mg by mouth at bedtime.   CITRACAL/VITAMIN D PO Take 2 tablets by mouth every evening.   cyclobenzaprine 5 MG tablet Commonly known as:  FLEXERIL Take 1 tablet (5 mg total) by mouth 3 (three) times daily as needed for muscle spasms.   diazepam 5 MG tablet Commonly known as:  VALIUM Take 5 mg by mouth at bedtime as needed (back spasms).   ferrous sulfate 325 (65 FE) MG tablet Take 325 mg by mouth daily.   Fish Oil 1000 MG Caps Take 1,000 mg by mouth daily.   hydrALAZINE 25 MG tablet Commonly known as:  APRESOLINE HOLD UNTIL SEEN BY YOUR PCP Morning, mid afternoon and bedtime What changed:    how much to take  how to take this  when to take this  additional instructions   insulin aspart 100 UNIT/ML injection Commonly known as:  novoLOG Inject 2 Units into the skin 3 (three) times daily with meals. What changed:    how much to take  when to take  this  reasons to take this   isosorbide mononitrate 120 MG 24 hr tablet Commonly known as:  IMDUR Take 120 mg by mouth daily.   Levemir FlexTouch 100 UNIT/ML Pen Generic drug:  Insulin Detemir Inject 16 Units into the skin daily before breakfast.   levothyroxine 75 MCG tablet Commonly known as:  SYNTHROID Take 75 mcg by mouth daily before breakfast.   Melatonin 5 MG Tabs Take 5 mg by mouth at bedtime.   multivitamin with minerals Tabs tablet Take 1 tablet by mouth daily.   mupirocin cream 2 % Commonly known as:  BACTROBAN Apply topically 2 (two) times daily.   oxyCODONE-acetaminophen 5-325 MG tablet Commonly known as:  PERCOCET/ROXICET Take 1-2 tablets by mouth every 6 (six) hours as needed for up to 5 days for severe pain. What changed:  reasons to take this   rosuvastatin 10 MG tablet Commonly known as:  CRESTOR Take 1 tablet (10 mg total) by mouth daily. What changed:  when to take this     torsemide 20 MG tablet Commonly known as:  DEMADEX Take 2 tablets (40 mg total) by mouth daily as needed. What changed:    when to take this  reasons to take this   warfarin 2.5 MG tablet Commonly known as:  COUMADIN Take as directed. If you are unsure how to take this medication, talk to your nurse or doctor. Original instructions:  TAKE 7.5 MG ON M-W-F AND 5 MG TUES, THURS, SAT AND SUN AS DIRECTED What changed:  See the new instructions.      Allergies  Allergen Reactions   Morphine And Related Rash   Follow-up Information    [facility] Follow up.   Specialty:  Cardiology Why:  09/09/2018 @ 3:00PM, wound check visit Contact information: [address], [place]       [name], MD Follow up.   Specialty:  Cardiology Why:  12/01/2018 @ 2:00PM Contact information: [place] [address] [phone]        Judith Part, MD Follow up.   Specialty:  Neurosurgery Why:  Should follow up with Dr Zada Finders in 2 weeks in the office,  call [phone] to make an appointment Contact information: [place] [address] [phone]        [facility] Follow up.   Why:  Physical and Occupational Therapy- Office to call you to set up time to visit or you may call [phone]           The results of significant diagnostics from this hospitalization (including imaging, microbiology, ancillary and laboratory) are listed below for reference.    Significant Diagnostic Studies: Dg Chest 2 View  Result Date: 09/01/2018 CLINICAL DATA:  Permanent pacemaker placement. EXAM: CHEST - 2 VIEW COMPARISON:  05/04/2016 FINDINGS: Left-sided single lead permanent pacemaker is new in the interval. No evidence for pneumothorax. The cardio pericardial silhouette is enlarged. There is pulmonary vascular congestion without overt pulmonary edema. No  focal airspace consolidation. IMPRESSION: No evidence for pneumothorax status post left permanent pacemaker placement. No pleural effusion. Electronically Signed   By: Misty Stanley M.D.   On: 09/01/2018 08:23   Dg Cervical Spine 2 Or 3 Views  Result Date: 09/02/2018 CLINICAL DATA:  63 year old female with a history of neck pain. Prior CT 08/29/2018 demonstrating a left C6 nondisplaced facet fracture, right C7 nondisplaced pedicle fracture/facet fracture. EXAM: CERVICAL SPINE -  2-3 VIEW COMPARISON:  CT 08/29/2018 FINDINGS: Alignment is unchanged from the prior. Relative straightening of the normal cervical lordosis. Craniocervical junction aligned. Vertebral body heights maintained. Advanced disc disease spanning C3-C7 with disc space narrowing, sclerotic endplate changes, narrowing, uncovertebral joint disease, and anterior osteophyte production. No new malalignment/anterolisthesis/retrolisthesis. The fracture lines previously identified on CT are not visualized on the current plain film. No new acute fracture identified. Anterior view demonstrates bilateral facet hypertrophy. Left carotid calcifications. IMPRESSION: No new acute bony abnormality or malalignment. The previously identified nondisplaced left C6 facet and right C7 facet/pedicle fracture are not well visualized. Advanced disc disease spanning C3-C7. Electronically Signed   By: Corrie Mckusick D.O.   On: 09/02/2018 10:57   Ct Head Wo Contrast  Result Date: 08/29/2018 CLINICAL DATA:  Seizure, unwitnessed fall EXAM: CT HEAD WITHOUT CONTRAST CT CERVICAL SPINE WITHOUT CONTRAST TECHNIQUE: Multidetector CT imaging of the head and cervical spine was performed following the standard protocol without intravenous contrast. Multiplanar CT image reconstructions of the cervical spine were also generated. COMPARISON:  09/08/2012 FINDINGS: CT HEAD FINDINGS Brain: No evidence of acute infarction, hemorrhage, hydrocephalus, extra-axial collection or mass  lesion/mass effect. Mild periventricular white matter hypodensity. Vascular: No hyperdense vessel or unexpected calcification. Skull: Normal. Negative for fracture or focal lesion. Sinuses/Orbits: No acute finding. Other: None. CT CERVICAL SPINE FINDINGS Alignment: Normal. Skull base and vertebrae: There is a minimally displaced fracture of the left facet of C6 (series 11, image 33, series 9, image 58). There are additional comminuted fractures of the right facet and pedicle of C7, this level somewhat limited by motion artifact (series 12, image 60, series 11, image 34). No primary bone lesion or focal pathologic process. Soft tissues and spinal canal: No prevertebral fluid or swelling. No visible canal hematoma. Disc levels: There is severe multilevel disc and facet degenerative disease, worst from C3 through C7. Upper chest: Negative. Other: None. IMPRESSION: 1. No acute intracranial pathology. Small-vessel white matter disease. 2. There is a minimally displaced fracture of the left facet of C6 (series 11, image 33, series 9, image 58). There are additional comminuted fractures of the right facet and pedicle of C7, this level somewhat limited by motion artifact (series 12, image 60, series 11, image 34). 3.  No other fracture or static subluxation of the cervical spine. 4. Severe multilevel disc and facet degenerative disease of the cervical spine. Electronically Signed   By: Eddie Candle M.D.   On: 08/29/2018 15:03   Ct Cervical Spine Wo Contrast  Result Date: 08/29/2018 CLINICAL DATA:  Seizure, unwitnessed fall EXAM: CT HEAD WITHOUT CONTRAST CT CERVICAL SPINE WITHOUT CONTRAST TECHNIQUE: Multidetector CT imaging of the head and cervical spine was performed following the standard protocol without intravenous contrast. Multiplanar CT image reconstructions of the cervical spine were also generated. COMPARISON:  09/08/2012 FINDINGS: CT HEAD FINDINGS Brain: No evidence of acute infarction, hemorrhage,  hydrocephalus, extra-axial collection or mass lesion/mass effect. Mild periventricular white matter hypodensity. Vascular: No hyperdense vessel or unexpected calcification. Skull: Normal. Negative for fracture or focal lesion. Sinuses/Orbits: No acute finding. Other: None. CT CERVICAL SPINE FINDINGS Alignment: Normal. Skull base and vertebrae: There is a minimally displaced fracture of the left facet of C6 (series 11, image 33, series 9, image 58). There are additional comminuted fractures of the right facet and pedicle of C7, this level somewhat limited by motion artifact (series 12, image 60, series 11, image 34). No primary bone lesion or focal pathologic process. Soft tissues and spinal canal:  No prevertebral fluid or swelling. No visible canal hematoma. Disc levels: There is severe multilevel disc and facet degenerative disease, worst from C3 through C7. Upper chest: Negative. Other: None. IMPRESSION: 1. No acute intracranial pathology. Small-vessel white matter disease. 2. There is a minimally displaced fracture of the left facet of C6 (series 11, image 33, series 9, image 58). There are additional comminuted fractures of the right facet and pedicle of C7, this level somewhat limited by motion artifact (series 12, image 60, series 11, image 34). 3.  No other fracture or static subluxation of the cervical spine. 4. Severe multilevel disc and facet degenerative disease of the cervical spine. Electronically Signed   By: Eddie Candle M.D.   On: 08/29/2018 15:03    Microbiology: Recent Results (from the past 240 hour(s))  SARS Coronavirus 2 (CEPHEID - Performed in [facility] hospital lab), Hosp Order     Status: None   Collection Time: 08/29/18  1:12 PM  Result Value Ref Range Status   SARS Coronavirus 2 NEGATIVE NEGATIVE Final    Comment: (NOTE) If result is NEGATIVE SARS-CoV-2 target nucleic acids are NOT DETECTED. The SARS-CoV-2 RNA is generally detectable in upper and lower  respiratory specimens  during the acute phase of infection. The lowest  concentration of SARS-CoV-2 viral copies this assay can detect is 250  copies / mL. A negative result does not preclude SARS-CoV-2 infection  and should not be used as the sole basis for treatment or other  patient management decisions.  A negative result may occur with  improper specimen collection / handling, submission of specimen other  than nasopharyngeal swab, presence of viral mutation(s) within the  areas targeted by this assay, and inadequate number of viral copies  (<250 copies / mL). A negative result must be combined with clinical  observations, patient history, and epidemiological information. If result is POSITIVE SARS-CoV-2 target nucleic acids are DETECTED. The SARS-CoV-2 RNA is generally detectable in upper and lower  respiratory specimens dur ing the acute phase of infection.  Positive  results are indicative of active infection with SARS-CoV-2.  Clinical  correlation with patient history and other diagnostic information is  necessary to determine patient infection status.  Positive results do  not rule out bacterial infection or co-infection with other viruses. If result is PRESUMPTIVE POSTIVE SARS-CoV-2 nucleic acids MAY BE PRESENT.   A presumptive positive result was obtained on the submitted specimen  and confirmed on repeat testing.  While 2019 novel coronavirus  (SARS-CoV-2) nucleic acids may be present in the submitted sample  additional confirmatory testing may be necessary for epidemiological  and / or clinical management purposes  to differentiate between  SARS-CoV-2 and other Sarbecovirus currently known to infect humans.  If clinically indicated additional testing with an alternate test  methodology [phone]) is advised. The SARS-CoV-2 RNA is generally  detectable in upper and lower respiratory sp ecimens during the acute  phase of infection. The expected result is Negative. Fact Sheet for Patients:   StrictlyIdeas.no Fact Sheet for Healthcare Providers: BankingDealers.co.za This test is not yet approved or cleared by the [place] FDA and has been authorized for detection and/or diagnosis of SARS-CoV-2 by FDA under an Emergency Use Authorization (EUA).  This EUA will remain in effect (meaning this test can be used) for the duration of the COVID-19 declaration under Section 564(b)(1) of the Act, 21 U.S.C. section 360bbb-3(b)(1), unless the authorization is terminated or revoked sooner. Performed at [facility], [place]  [address], [place], [place] [address]   MRSA PCR Screening     Status: None   Collection Time: 08/30/18 10:27 AM  Result Value Ref Range Status   MRSA by PCR NEGATIVE NEGATIVE Final    Comment:        The GeneXpert MRSA Assay (FDA approved for NASAL specimens only), is one component of a comprehensive MRSA colonization surveillance program. It is not intended to diagnose MRSA infection nor to guide or monitor treatment for MRSA infections. Performed at [facility], [place] [address], [place],  [address]      Labs: Basic Metabolic Panel: Recent Labs  Lab 08/29/18 1248 08/30/18 0319 09/01/18 0344 09/03/18 0444  NA 135 138 138 139  K 5.6* 5.1 5.1 5.2*  CL 103 107 105 107  CO2 22 23 23 26   GLUCOSE 174* 145* 117* 106*  BUN 64* 61* 47* 45*  CREATININE 2.24* 2.23* 1.87* 1.64*  CALCIUM 9.1 8.7* 8.9 9.3   Liver Function Tests: Recent Labs  Lab 08/29/18 1248  AST 39  ALT 27  ALKPHOS 55  BILITOT 0.9  PROT 6.3*  ALBUMIN 3.9   No results for input(s): LIPASE, AMYLASE in the last 168 hours. No results for input(s): AMMONIA in the last 168 hours. CBC: Recent Labs  Lab 08/29/18 1248 08/30/18 0319 09/01/18 0344 09/03/18 0535  WBC 7.5 7.0 6.6 5.9  NEUTROABS 6.3  --   --   --   HGB 10.7* 9.5* 9.6* 9.9*  HCT 34.0* 29.7* 30.9* 32.0*  MCV 101.5* 100.0 102.0* 101.9*  PLT 140* 117* 103*  96*   Cardiac Enzymes: Recent Labs  Lab 08/30/18 1138 08/30/18 1644 08/30/18 2319  TROPONINI 0.18* 0.16* 0.11*   BNP: BNP (last 3 results) No results for input(s): BNP in the last 8760 hours.  ProBNP (last 3 results) No results for input(s): PROBNP in the last 8760 hours.  CBG: Recent Labs  Lab 09/02/18 0751 09/02/18 1147 09/02/18 1651 09/02/18 2206 09/03/18 0752  GLUCAP 121* 113* 127* 109* 89       Signed:  Desiree Hane, MD [facility] 09/03/2018, 10:10 AM

## 2018-09-03 NOTE — Progress Notes (Signed)
OT Cancellation Note  Patient Details Name: [name] MRN: [id number] DOB: 02-09-1956   Cancelled Treatment:    Reason Eval/Treat Not Completed: Other (comment). Pt politely declined OT, getting ready to d/c home this afternoon  Britt Bottom 09/03/2018, 1:26 PM

## 2018-09-03 NOTE — TOC Transition Note (Signed)
Transition of Care [facility]) - CM/SW Discharge Note   Patient Details  Name: RAYLYNN HERSH MRN: [id number] Date of Birth: 25-Dec-1955  [facility]) CM/SW Contact:  Bethena Roys, RN Phone Number: 09/03/2018, 10:56 AM   Clinical Narrative: Pt presented for syncope. Pt is from home with support of significant other. Patient agreeable to home health services with [facility]. SOC to begin Monday. No further needs from CM at this time.       Final next level of care: [place] Barriers to Discharge: No Barriers Identified   Patient Goals and CMS Choice Patient states their goals for this hospitalization and ongoing recovery are:: 'to go back home and see my dogs" CMS Medicare.gov Compare Post Acute Care list provided to:: Patient Choice offered to / list presented to : Patient   Discharge Plan and Services In-house Referral: NA Discharge Planning Services: CM Consult Post Acute Care Choice: Home Health          DME Arranged: N/A DME Agency: NA       HH Arranged: PT, OT [place] Agency: [place] ([facility]) Date HH Agency Contacted: 09/02/18 Time [place]: 1630 Representative spoke with at [place]: [place] ([place]) Interventions     Readmission Risk Interventions Readmission Risk Prevention Plan 09/03/2018  Transportation Screening Complete  PCP or Specialist Appt within 3-5 Days Complete  HRI or [place] Complete  Social Work Consult for [place] Planning/Counseling Complete  Palliative Care Screening Not Applicable  Medication Review [employer]) Complete  Some recent data might be hidden

## 2018-09-03 NOTE — Progress Notes (Signed)
Physical Therapy Treatment Patient Details Name: [name] MRN: [id number] DOB: Mar 06, 1956 Today's Date: 09/03/2018    History of Present Illness  63 y.o. female with PMHx including DVT/PE (2014); OSA; hypothyroidism; HTN; HLD; chronic low back pain; CHF; and afib on Coumadin now presenting with syncope and x2 falls prior to EMS arrival.  CT C-spine revealed Left C6 unilateral facet fracture, right C7 unilateral facet fracture with possible extension into the right C7 pedicle, currently being treated non-operatively. Pt is now s/p pacemaker placement on 5/26 secondary to symptomatic bradycardia.     PT Comments    Pt received in bed. Agreeable to participation in therapy. She required min assist bed mobility, min guard assist transfers and min guard assist ambulation 200 feet with RW. Stair training completed with pt requiring min assist utilizing L handrail with sideways ascend/descend.    Follow Up Recommendations  Home health PT;Supervision/Assistance - 24 hour     Equipment Recommendations  Rolling walker with 5" wheels    Recommendations for Other Services       Precautions / Restrictions Precautions Precautions: Cervical;Fall;ICD/Pacemaker Precaution Comments: verbally reviewed pacemaker, cervical precautions Required Braces or Orthoses: Cervical Brace Cervical Brace: Hard collar;At all times Restrictions LUE Weight Bearing: Non weight bearing    Mobility  Bed Mobility Overal bed mobility: Needs Assistance Bed Mobility: Supine to Sit     Supine to sit: Min assist     General bed mobility comments: assist to elevate trunk, able to maintain NWB LUE  Transfers Overall transfer level: Needs assistance Equipment used: Ambulation equipment used Transfers: Sit to/from Stand Sit to Stand: Min guard         General transfer comment: increased time to stabilize initial standing balance  Ambulation/Gait Ambulation/Gait assistance: Min guard Gait Distance  (Feet): 200 Feet Assistive device: Rolling walker (2 wheeled) Gait Pattern/deviations: Step-through pattern;Decreased stride length Gait velocity: very slow Gait velocity interpretation: <1.31 ft/sec, indicative of household ambulator General Gait Details: decreased foot clearance bilat, Pt resting L hand on RW and pushing with R   Stairs Stairs: Yes Stairs assistance: Min assist Stair Management: One rail Left;Sideways Number of Stairs: 4 General stair comments: increased time and effort, verbal instruction and assist provided   Wheelchair Mobility    Modified Rankin (Stroke Patients Only)       Balance Overall balance assessment: Needs assistance Sitting-balance support: Feet supported Sitting balance-[name]: Fair     Standing balance support: Bilateral upper extremity supported Standing balance-[name]: Poor Standing balance comment: reliant on UE support at this time                            Cognition Arousal/Alertness: Awake/alert Behavior During Therapy: WFL for tasks assessed/performed Overall Cognitive Status: Within Functional Limits for tasks assessed                                        Exercises      General Comments        Pertinent Vitals/Pain Pain Assessment: Faces Faces Pain Scale: Hurts even more Pain Location: L shoulder with certain movements Pain Descriptors / Indicators: Discomfort;Sore;Spasm Pain Intervention(s): Monitored during session;Limited activity within patient's tolerance;Repositioned    Home Living                      Prior Function  PT Goals (current goals can now be found in the care plan section) Acute Rehab PT Goals Patient Stated Goal: regain independence, return home PT Goal Formulation: With patient Time For Goal Achievement: 09/15/18 Potential to Achieve Goals: Good Progress towards PT goals: Progressing toward goals    Frequency    Min  3X/week      PT Plan Current plan remains appropriate    Co-evaluation              AM-PAC PT "6 Clicks" Mobility   Outcome Measure  Help needed turning from your back to your side while in a flat bed without using bedrails?: A Little Help needed moving from lying on your back to sitting on the side of a flat bed without using bedrails?: A Little Help needed moving to and from a bed to a chair (including a wheelchair)?: A Little Help needed standing up from a chair using your arms (e.g., wheelchair or bedside chair)?: A Little Help needed to walk in hospital room?: A Little Help needed climbing 3-5 steps with a railing? : A Little 6 Click Score: 18    End of Session Equipment Utilized During Treatment: Gait belt;Cervical collar Activity Tolerance: Patient tolerated treatment well Patient left: in chair;with call bell/phone within reach Nurse Communication: Mobility status PT Visit Diagnosis: Unsteadiness on feet (R26.81);Muscle weakness (generalized) (M62.81);History of falling (Z91.81)     Time: 1696-7893 PT Time Calculation (min) (ACUTE ONLY): 24 min  Charges:  $Gait Training: 23-37 mins                     Lorrin Goodell, [place]  Office # [phone] Pager [place], [place] 09/03/2018, 11:26 AM

## 2018-09-06 ENCOUNTER — Telehealth: Payer: Self-pay | Admitting: Student

## 2018-09-06 NOTE — Telephone Encounter (Signed)
Patient returned call

## 2018-09-06 NOTE — Telephone Encounter (Signed)
Pt answered no to all questions. Knows to call back if any changes prior to her appointment Thursday.

## 2018-09-06 NOTE — Telephone Encounter (Signed)
LMOM for COVID-19 pre-screening. Script as below.       COVID-19 Pre-Screening Questions:  . In the past 7 to 10 days have you had a cough,  shortness of breath, headache, congestion, fever (100 or greater) body aches, chills, sore throat, or sudden loss of taste or sense of smell? . Have you been around anyone with known Covid 19. . Have you been around anyone who is awaiting Covid 19 test results in the past 7 to 10 days? . Have you been around anyone who has been exposed to Covid 19, or has mentioned symptoms of Covid 19 within the past 7 to 10 days?  If you have any concerns/questions about symptoms patients report during screening (either on the phone or at threshold). Contact the provider seeing the patient or DOD for further guidance.  If neither are available contact a member of the leadership team.

## 2018-09-09 ENCOUNTER — Ambulatory Visit (INDEPENDENT_AMBULATORY_CARE_PROVIDER_SITE_OTHER): Payer: Medicare HMO | Admitting: Student

## 2018-09-09 ENCOUNTER — Other Ambulatory Visit: Payer: Self-pay

## 2018-09-09 DIAGNOSIS — R001 Bradycardia, unspecified: Secondary | ICD-10-CM

## 2018-09-09 LAB — CUP PACEART INCLINIC DEVICE CHECK
Battery Remaining Longevity: 172 mo
Battery Voltage: 3.22 V
Brady Statistic RV Percent Paced: 46.41 %
Date Time Interrogation Session: 20200604153345
Implantable Lead Implant Date: 20200526
Implantable Lead Location: 753860
Implantable Lead Model: 5076
Implantable Pulse Generator Implant Date: 20200526
Lead Channel Impedance Value: 418 Ohm
Lead Channel Impedance Value: 551 Ohm
Lead Channel Pacing Threshold Amplitude: 0.375 V
Lead Channel Pacing Threshold Pulse Width: 0.4 ms
Lead Channel Sensing Intrinsic Amplitude: 14.625 mV
Lead Channel Sensing Intrinsic Amplitude: 14.75 mV
Lead Channel Setting Pacing Amplitude: 3.5 V
Lead Channel Setting Pacing Pulse Width: 0.4 ms
Lead Channel Setting Sensing Sensitivity: 2.8 mV

## 2018-09-09 NOTE — Progress Notes (Signed)
Wound check appointment. Steri-strips removed. Wound without redness or edema. Incision edges approximated, wound well healed. Pt has tenderness out of proportion to exam on lower portion of device border. Intermittent. Normal device function. Thresholds, sensing, and impedances consistent with implant measurements. Device programmed at 3.5V/auto capture programmed on for extra safety margin until 3 month visit. Histogram distribution appropriate for patient and level of activity. No mode switches or high ventricular rates noted. Patient educated about wound care, arm mobility, lifting restrictions. ROV in 3 months with Dr. Rayann Heman.   [name] [address] [place], [place] 09/09/2018 3:33 PM

## 2018-11-09 ENCOUNTER — Other Ambulatory Visit: Payer: Self-pay

## 2018-11-09 DIAGNOSIS — Z20822 Contact with and (suspected) exposure to covid-19: Secondary | ICD-10-CM

## 2018-11-10 LAB — NOVEL CORONAVIRUS, NAA: SARS-CoV-2, NAA: NOT DETECTED

## 2018-11-11 ENCOUNTER — Telehealth: Payer: Self-pay | Admitting: Internal Medicine

## 2018-11-11 NOTE — Telephone Encounter (Signed)
Pt states she sent the transmission that [facility] asked for. Transmission received. I told her if the nurse needs anything else from her someone will call her.

## 2018-11-11 NOTE — Telephone Encounter (Signed)
Transmission reviewed. No episodes. Normal device function.  Will forward to covering RN  Chanetta Marshall, NP 11/11/2018 7:31 PM

## 2018-11-11 NOTE — Telephone Encounter (Signed)
Spoke w/ pt and she stated that she is not home at this time but will be in about an hour and will send transmission at that time.

## 2018-11-11 NOTE — Telephone Encounter (Signed)
°  1. Has your device fired? She doesn't know.  2. Is you device beeping? no  3. Are you experiencing draining or swelling at device site? no  4. Are you calling to see if we received your device transmission? no  5. Have you passed out? no  She states on Tuesday, 11/09/18 she was sitting on the porch she felt like she was being eletrocuted in the middle of her chest.  It felt like a shock. Patient states the past week she has been feeling dizzy, keeps getting worse day by day. She also states she has had some SOB.    Please route to [place]

## 2018-11-12 NOTE — Telephone Encounter (Signed)
Left message for Pt advising pacemaker transmission was normal.  Advised a pacemaker would never make her feel like she received a shock or was electrocuted.  Advised she may want to contact her PCP to see if she could be assessed for dizziness and sob.  Advised to call this  Nurse back to discuss.

## 2018-11-26 ENCOUNTER — Telehealth: Payer: Self-pay

## 2018-12-01 ENCOUNTER — Other Ambulatory Visit: Payer: Self-pay

## 2018-12-01 ENCOUNTER — Ambulatory Visit (INDEPENDENT_AMBULATORY_CARE_PROVIDER_SITE_OTHER): Payer: Medicare HMO | Admitting: *Deleted

## 2018-12-01 ENCOUNTER — Telehealth: Payer: Medicare HMO | Admitting: Internal Medicine

## 2018-12-01 ENCOUNTER — Encounter: Payer: Self-pay | Admitting: Internal Medicine

## 2018-12-01 DIAGNOSIS — R001 Bradycardia, unspecified: Secondary | ICD-10-CM

## 2018-12-01 NOTE — Progress Notes (Signed)
No show for virtual visit.  Will reschedule as in office visit at the next available time to allow for device reprogramming.  [name] MD, [place] 12/01/2018 2:29 PM

## 2018-12-02 LAB — CUP PACEART REMOTE DEVICE CHECK
Battery Remaining Longevity: 145 mo
Battery Voltage: 3.2 V
Brady Statistic RV Percent Paced: 50.42 %
Date Time Interrogation Session: 20200827131815
Implantable Lead Implant Date: 20200526
Implantable Lead Location: 753860
Implantable Lead Model: 5076
Implantable Pulse Generator Implant Date: 20200526
Lead Channel Impedance Value: 399 Ohm
Lead Channel Impedance Value: 608 Ohm
Lead Channel Pacing Threshold Amplitude: 0.5 V
Lead Channel Pacing Threshold Pulse Width: 0.4 ms
Lead Channel Sensing Intrinsic Amplitude: 11.125 mV
Lead Channel Setting Pacing Amplitude: 3 V
Lead Channel Setting Pacing Pulse Width: 0.4 ms
Lead Channel Setting Sensing Sensitivity: 2.8 mV

## 2018-12-06 ENCOUNTER — Telehealth: Payer: Medicare HMO | Admitting: Internal Medicine

## 2018-12-10 ENCOUNTER — Encounter: Payer: Self-pay | Admitting: Cardiology

## 2018-12-10 NOTE — Progress Notes (Signed)
Remote pacemaker transmission.   

## 2018-12-10 NOTE — Addendum Note (Signed)
Addended by: Tiajuana Amass on: 12/10/2018 11:44 AM   Modules accepted: Level of Service

## 2018-12-24 ENCOUNTER — Encounter: Payer: Medicare HMO | Admitting: Internal Medicine

## 2018-12-26 ENCOUNTER — Other Ambulatory Visit: Payer: Self-pay

## 2018-12-26 ENCOUNTER — Emergency Department (HOSPITAL_COMMUNITY): Payer: Medicare HMO

## 2018-12-26 ENCOUNTER — Inpatient Hospital Stay (HOSPITAL_COMMUNITY)
Admission: EM | Admit: 2018-12-26 | Discharge: 2018-12-31 | DRG: 286 | Disposition: A | Discharge status: Home Health | Payer: Medicare HMO | Attending: Internal Medicine | Admitting: Internal Medicine

## 2018-12-26 ENCOUNTER — Encounter (HOSPITAL_COMMUNITY): Payer: Self-pay

## 2018-12-26 DIAGNOSIS — Z20828 Contact with and (suspected) exposure to other viral communicable diseases: Secondary | ICD-10-CM | POA: Diagnosis present

## 2018-12-26 DIAGNOSIS — Z86711 Personal history of pulmonary embolism: Secondary | ICD-10-CM

## 2018-12-26 DIAGNOSIS — E869 Volume depletion, unspecified: Secondary | ICD-10-CM | POA: Diagnosis present

## 2018-12-26 DIAGNOSIS — J449 Chronic obstructive pulmonary disease, unspecified: Secondary | ICD-10-CM | POA: Diagnosis present

## 2018-12-26 DIAGNOSIS — K219 Gastro-esophageal reflux disease without esophagitis: Secondary | ICD-10-CM | POA: Diagnosis present

## 2018-12-26 DIAGNOSIS — Z9842 Cataract extraction status, left eye: Secondary | ICD-10-CM

## 2018-12-26 DIAGNOSIS — I509 Heart failure, unspecified: Secondary | ICD-10-CM

## 2018-12-26 DIAGNOSIS — I441 Atrioventricular block, second degree: Secondary | ICD-10-CM | POA: Diagnosis not present

## 2018-12-26 DIAGNOSIS — Z8249 Family history of ischemic heart disease and other diseases of the circulatory system: Secondary | ICD-10-CM

## 2018-12-26 DIAGNOSIS — I130 Hypertensive heart and chronic kidney disease with heart failure and stage 1 through stage 4 chronic kidney disease, or unspecified chronic kidney disease: Secondary | ICD-10-CM | POA: Diagnosis present

## 2018-12-26 DIAGNOSIS — E119 Type 2 diabetes mellitus without complications: Secondary | ICD-10-CM | POA: Diagnosis not present

## 2018-12-26 DIAGNOSIS — I484 Atypical atrial flutter: Secondary | ICD-10-CM | POA: Diagnosis not present

## 2018-12-26 DIAGNOSIS — E785 Hyperlipidemia, unspecified: Secondary | ICD-10-CM | POA: Diagnosis present

## 2018-12-26 DIAGNOSIS — N184 Chronic kidney disease, stage 4 (severe): Secondary | ICD-10-CM | POA: Diagnosis present

## 2018-12-26 DIAGNOSIS — G4730 Sleep apnea, unspecified: Secondary | ICD-10-CM | POA: Diagnosis present

## 2018-12-26 DIAGNOSIS — Z9841 Cataract extraction status, right eye: Secondary | ICD-10-CM

## 2018-12-26 DIAGNOSIS — I5021 Acute systolic (congestive) heart failure: Secondary | ICD-10-CM | POA: Diagnosis not present

## 2018-12-26 DIAGNOSIS — E7800 Pure hypercholesterolemia, unspecified: Secondary | ICD-10-CM | POA: Diagnosis present

## 2018-12-26 DIAGNOSIS — N179 Acute kidney failure, unspecified: Secondary | ICD-10-CM | POA: Diagnosis present

## 2018-12-26 DIAGNOSIS — I4821 Permanent atrial fibrillation: Secondary | ICD-10-CM | POA: Diagnosis present

## 2018-12-26 DIAGNOSIS — I5033 Acute on chronic diastolic (congestive) heart failure: Secondary | ICD-10-CM | POA: Diagnosis present

## 2018-12-26 DIAGNOSIS — E782 Mixed hyperlipidemia: Secondary | ICD-10-CM | POA: Diagnosis not present

## 2018-12-26 DIAGNOSIS — Z86718 Personal history of other venous thrombosis and embolism: Secondary | ICD-10-CM

## 2018-12-26 DIAGNOSIS — Z7901 Long term (current) use of anticoagulants: Secondary | ICD-10-CM

## 2018-12-26 DIAGNOSIS — Z95828 Presence of other vascular implants and grafts: Secondary | ICD-10-CM

## 2018-12-26 DIAGNOSIS — F329 Major depressive disorder, single episode, unspecified: Secondary | ICD-10-CM | POA: Diagnosis present

## 2018-12-26 DIAGNOSIS — I272 Pulmonary hypertension, unspecified: Secondary | ICD-10-CM | POA: Diagnosis present

## 2018-12-26 DIAGNOSIS — Z79899 Other long term (current) drug therapy: Secondary | ICD-10-CM | POA: Diagnosis not present

## 2018-12-26 DIAGNOSIS — E039 Hypothyroidism, unspecified: Secondary | ICD-10-CM | POA: Diagnosis present

## 2018-12-26 DIAGNOSIS — F419 Anxiety disorder, unspecified: Secondary | ICD-10-CM | POA: Diagnosis present

## 2018-12-26 DIAGNOSIS — E1022 Type 1 diabetes mellitus with diabetic chronic kidney disease: Secondary | ICD-10-CM | POA: Diagnosis present

## 2018-12-26 DIAGNOSIS — Z950 Presence of cardiac pacemaker: Secondary | ICD-10-CM

## 2018-12-26 DIAGNOSIS — W1800XA Striking against unspecified object with subsequent fall, initial encounter: Secondary | ICD-10-CM

## 2018-12-26 DIAGNOSIS — Z79890 Hormone replacement therapy (postmenopausal): Secondary | ICD-10-CM

## 2018-12-26 DIAGNOSIS — R55 Syncope and collapse: Secondary | ICD-10-CM | POA: Diagnosis present

## 2018-12-26 DIAGNOSIS — N390 Urinary tract infection, site not specified: Secondary | ICD-10-CM | POA: Diagnosis present

## 2018-12-26 DIAGNOSIS — Z6841 Body Mass Index (BMI) 40.0 and over, adult: Secondary | ICD-10-CM | POA: Diagnosis not present

## 2018-12-26 DIAGNOSIS — Z794 Long term (current) use of insulin: Secondary | ICD-10-CM | POA: Diagnosis not present

## 2018-12-26 DIAGNOSIS — G4733 Obstructive sleep apnea (adult) (pediatric): Secondary | ICD-10-CM | POA: Diagnosis present

## 2018-12-26 DIAGNOSIS — Z961 Presence of intraocular lens: Secondary | ICD-10-CM | POA: Diagnosis present

## 2018-12-26 DIAGNOSIS — B9620 Unspecified Escherichia coli [E. coli] as the cause of diseases classified elsewhere: Secondary | ICD-10-CM | POA: Diagnosis present

## 2018-12-26 DIAGNOSIS — IMO0001 Reserved for inherently not codable concepts without codable children: Secondary | ICD-10-CM

## 2018-12-26 DIAGNOSIS — Z9181 History of falling: Secondary | ICD-10-CM

## 2018-12-26 DIAGNOSIS — E860 Dehydration: Secondary | ICD-10-CM | POA: Diagnosis present

## 2018-12-26 LAB — BASIC METABOLIC PANEL
Anion gap: 12 (ref 5–15)
BUN: 51 mg/dL — ABNORMAL HIGH (ref 8–23)
CO2: 28 mmol/L (ref 22–32)
Calcium: 10.4 mg/dL — ABNORMAL HIGH (ref 8.9–10.3)
Chloride: 99 mmol/L (ref 98–111)
Creatinine, Ser: 2.23 mg/dL — ABNORMAL HIGH (ref 0.44–1.00)
GFR calc Af Amer: 27 mL/min — ABNORMAL LOW (ref >60)
GFR calc non Af Amer: 23 mL/min — ABNORMAL LOW (ref >60)
Glucose, Bld: 100 mg/dL — ABNORMAL HIGH (ref 70–99)
Potassium: 4.7 mmol/L (ref 3.5–5.1)
Sodium: 139 mmol/L (ref 135–145)

## 2018-12-26 LAB — BRAIN NATRIURETIC PEPTIDE: B Natriuretic Peptide: 430.9 pg/mL — ABNORMAL HIGH (ref 0.0–100.0)

## 2018-12-26 LAB — CBC WITH DIFFERENTIAL/PLATELET
Abs Immature Granulocytes: 0.02 10*3/uL (ref 0.00–0.07)
Basophils Absolute: 0 10*3/uL (ref 0.0–0.1)
Basophils Relative: 1 %
Eosinophils Absolute: 0.5 10*3/uL (ref 0.0–0.5)
Eosinophils Relative: 8 %
HCT: 42.3 % (ref 36.0–46.0)
Hemoglobin: 13.1 g/dL (ref 12.0–15.0)
Immature Granulocytes: 0 %
Lymphocytes Relative: 14 %
Lymphs Abs: 0.8 10*3/uL (ref 0.7–4.0)
MCH: 31.8 pg (ref 26.0–34.0)
MCHC: 31 g/dL (ref 30.0–36.0)
MCV: 102.7 fL — ABNORMAL HIGH (ref 80.0–100.0)
Monocytes Absolute: 0.6 10*3/uL (ref 0.1–1.0)
Monocytes Relative: 10 %
Neutro Abs: 4.1 10*3/uL (ref 1.7–7.7)
Neutrophils Relative %: 67 %
Platelets: 163 10*3/uL (ref 150–400)
RBC: 4.12 MIL/uL (ref 3.87–5.11)
RDW: 15.6 % — ABNORMAL HIGH (ref 11.5–15.5)
WBC: 6 10*3/uL (ref 4.0–10.5)
nRBC: 0 % (ref 0.0–0.2)

## 2018-12-26 LAB — URINALYSIS, ROUTINE W REFLEX MICROSCOPIC
Bacteria, UA: NONE SEEN
Bilirubin Urine: NEGATIVE
Glucose, UA: NEGATIVE mg/dL
Hgb urine dipstick: NEGATIVE
Ketones, ur: NEGATIVE mg/dL
Nitrite: NEGATIVE
Protein, ur: 100 mg/dL — AB
Specific Gravity, Urine: 1.011 (ref 1.005–1.030)
WBC, UA: >50 WBC/hpf — ABNORMAL HIGH (ref 0–5)
pH: 8 (ref 5.0–8.0)

## 2018-12-26 LAB — D-DIMER, QUANTITATIVE: D-Dimer, Quant: 0.37 ug/mL-FEU (ref 0.00–0.50)

## 2018-12-26 LAB — PROTIME-INR
INR: 2.5 — ABNORMAL HIGH (ref 0.8–1.2)
Prothrombin Time: 26.3 seconds — ABNORMAL HIGH (ref 11.4–15.2)

## 2018-12-26 LAB — TROPONIN I (HIGH SENSITIVITY)
Troponin I (High Sensitivity): 54 ng/L — ABNORMAL HIGH (ref <18)
Troponin I (High Sensitivity): 54 ng/L — ABNORMAL HIGH (ref <18)

## 2018-12-26 MED ORDER — ISOSORBIDE MONONITRATE ER 60 MG PO TB24
120.0000 mg | ORAL_TABLET | Freq: Every day | ORAL | Status: DC
  Administered 2018-12-26 – 2018-12-28 (×3): 120 mg via ORAL
  Filled 2018-12-26 (×3): qty 2

## 2018-12-26 MED ORDER — ACETAMINOPHEN 325 MG PO TABS
650.0000 mg | ORAL_TABLET | ORAL | Status: DC | PRN
  Administered 2018-12-27 (×2): 650 mg via ORAL
  Filled 2018-12-26 (×3): qty 2

## 2018-12-26 MED ORDER — ONDANSETRON HCL 4 MG/2ML IJ SOLN: 4.0000 mg | Freq: Four times a day (QID) | INTRAMUSCULAR | Status: DC | PRN

## 2018-12-26 MED ORDER — SODIUM CHLORIDE 0.9 % IV SOLN
1.0000 g | Freq: Once | INTRAVENOUS | Status: AC
  Administered 2018-12-26: 1 g via INTRAVENOUS
  Filled 2018-12-26: qty 10

## 2018-12-26 MED ORDER — INSULIN ASPART 100 UNIT/ML ~~LOC~~ SOLN: 8.0000 [IU] | Freq: Three times a day (TID) | SUBCUTANEOUS | Status: DC | PRN

## 2018-12-26 MED ORDER — LEVOTHYROXINE SODIUM 75 MCG PO TABS
75.0000 ug | ORAL_TABLET | Freq: Every day | ORAL | Status: DC
  Administered 2018-12-27 – 2018-12-31 (×5): 75 ug via ORAL
  Filled 2018-12-26 (×5): qty 1

## 2018-12-26 MED ORDER — CITALOPRAM HYDROBROMIDE 20 MG PO TABS
20.0000 mg | ORAL_TABLET | Freq: Every day | ORAL | Status: DC
  Administered 2018-12-26 – 2018-12-30 (×5): 20 mg via ORAL
  Filled 2018-12-26 (×4): qty 2
  Filled 2018-12-26: qty 1

## 2018-12-26 MED ORDER — WARFARIN SODIUM 5 MG PO TABS
5.0000 mg | ORAL_TABLET | Freq: Once | ORAL | Status: AC
  Administered 2018-12-26: 5 mg via ORAL
  Filled 2018-12-26 (×2): qty 1

## 2018-12-26 MED ORDER — FERROUS SULFATE 325 (65 FE) MG PO TABS
325.0000 mg | ORAL_TABLET | Freq: Every day | ORAL | Status: DC
  Administered 2018-12-26 – 2018-12-31 (×6): 325 mg via ORAL
  Filled 2018-12-26 (×6): qty 1

## 2018-12-26 MED ORDER — WARFARIN - PHARMACIST DOSING INPATIENT
Freq: Every day | Status: DC
  Administered 2018-12-26: 19:00:00

## 2018-12-26 MED ORDER — FUROSEMIDE 10 MG/ML IJ SOLN
60.0000 mg | Freq: Two times a day (BID) | INTRAMUSCULAR | Status: DC
  Administered 2018-12-26 – 2018-12-27 (×2): 60 mg via INTRAVENOUS
  Filled 2018-12-26 (×4): qty 6

## 2018-12-26 MED ORDER — FUROSEMIDE 10 MG/ML IJ SOLN
40.0000 mg | Freq: Once | INTRAMUSCULAR | Status: AC
  Administered 2018-12-26: 14:00:00 40 mg via INTRAVENOUS
  Filled 2018-12-26: qty 4

## 2018-12-26 MED ORDER — MELATONIN 3 MG PO TABS
9.0000 mg | ORAL_TABLET | Freq: Every day | ORAL | Status: DC
  Administered 2018-12-26 – 2018-12-30 (×5): 9 mg via ORAL
  Filled 2018-12-26 (×6): qty 3

## 2018-12-26 MED ORDER — INSULIN GLARGINE 100 UNIT/ML ~~LOC~~ SOLN
10.0000 [IU] | Freq: Every day | SUBCUTANEOUS | Status: DC
  Administered 2018-12-27 – 2018-12-30 (×4): 10 [IU] via SUBCUTANEOUS
  Filled 2018-12-26 (×5): qty 0.1

## 2018-12-26 MED ORDER — SODIUM CHLORIDE 0.9% FLUSH
3.0000 mL | INTRAVENOUS | Status: DC | PRN
  Administered 2018-12-31: 3 mL via INTRAVENOUS
  Filled 2018-12-26: qty 3

## 2018-12-26 MED ORDER — HYDRALAZINE HCL 25 MG PO TABS
25.0000 mg | ORAL_TABLET | Freq: Three times a day (TID) | ORAL | Status: DC
  Administered 2018-12-26 – 2018-12-31 (×15): 25 mg via ORAL
  Filled 2018-12-26 (×15): qty 1

## 2018-12-26 MED ORDER — SODIUM CHLORIDE 0.9 % IV SOLN
1.0000 g | INTRAVENOUS | Status: AC
  Administered 2018-12-27 – 2018-12-30 (×4): 1 g via INTRAVENOUS
  Filled 2018-12-26 (×4): qty 10

## 2018-12-26 MED ORDER — SODIUM CHLORIDE 0.9 % IV SOLN: 250.0000 mL | INTRAVENOUS | Status: DC | PRN

## 2018-12-26 MED ORDER — SODIUM CHLORIDE 0.9% FLUSH
3.0000 mL | Freq: Two times a day (BID) | INTRAVENOUS | Status: DC
  Administered 2018-12-26 – 2018-12-30 (×8): 3 mL via INTRAVENOUS

## 2018-12-26 MED ORDER — ROSUVASTATIN CALCIUM 5 MG PO TABS
10.0000 mg | ORAL_TABLET | Freq: Every day | ORAL | Status: DC
  Administered 2018-12-26 – 2018-12-30 (×5): 10 mg via ORAL
  Filled 2018-12-26 (×5): qty 2

## 2018-12-26 NOTE — ED Notes (Signed)
Husband Contact NO: [phone] (Home) [phone] (Cell) [name]

## 2018-12-26 NOTE — Progress Notes (Signed)
ANTICOAGULATION CONSULT NOTE - Initial Consult  Pharmacy Consult for warfarin Indication: atrial fibrillation  Allergies  Allergen Reactions  . Morphine And Related Rash    Patient Measurements:    Vital Signs: Temp: 97.5 F (36.4 C) (09/20 1054) Temp Source: Oral (09/20 1054) BP: 148/72 (09/20 1500) Pulse Rate: 55 (09/20 1500)  Labs: Recent Labs    12/26/18 1212 12/26/18 1343  HGB 13.1  --   HCT 42.3  --   PLT 163  --   LABPROT 26.3*  --   INR 2.5*  --   CREATININE 2.23*  --   TROPONINIHS 54* 54*    CrCl cannot be calculated (Unknown ideal weight.).   Medical History: Past Medical History:  Diagnosis Date  . A-fib ([facility])   . Anemia   . Anxiety   . Arthritis    "right hip; both knees; left wrist/shoulder; back" (01/19/2013"  . Bleeding on Coumadin 08/2012; 01/18/2013   [facility] admissions (01/19/2013)  . CHF (congestive heart failure) ([place])    "2-3 times" (01/19/2013)  . Chronic lower back pain   . Depression   . DVT (deep venous thrombosis) ([place]) 10 years ago   numerous/notes 01/18/2013  . GERD (gastroesophageal reflux disease)   . Gout   . [id number])    "maybe weekly" (01/19/2013)  . Heart murmur   . High cholesterol    "been off RX for this at one time" (01/18/2013)  . History of blood transfusion 1983; 04/2012   "3 w/ childbirth; hospitalized for pain" (01/19/2013)  . Hypertension   . Hypothyroidism   . Migraines    "twice/yr maybe" (01/19/2013)  . OSA (obstructive sleep apnea)    "sent me for test in 04/2012; never ordered mask, etc" (01/19/2013)  . PE (pulmonary thromboembolism) ([place]) 3 years ag0   3/notes 01/18/2013  . Pneumonia before 2011   "once' (01/18/2013)  . Renal disorder    kindey function low; "Metformin was destroying my kidneys" (01/19/2013)  . Shortness of breath    "only related to my CHF" (01/18/2013)  . Swelling of hand 08/31/2014   RT HAND  . Type II diabetes mellitus ([place])   . UTI (urinary tract infection) 08/31/2014    Assessment: 13 yof presented to the ED with cough, shortness of breath and CP. She is on chronic warfarin for history of afib. INR is therapeutic at 2.5. CBC is WNL and no bleeding noted.   PTA dose: 5mg  daily except 7.5mg  on Tues + Thurs  Goal of Therapy:  INR 2-3 Monitor platelets by anticoagulation protocol: Yes   Plan:  Warfarin 5mg  PO x 1 tonight Daily INR  Hannibal Skalla, Rande Lawman 12/26/2018,4:20 PM

## 2018-12-26 NOTE — ED Notes (Signed)
Dinner tray ordered.

## 2018-12-26 NOTE — ED Triage Notes (Signed)
Patient complains of chest pain with cough. States that she has pain only with cough and has bad taste, no distress

## 2018-12-26 NOTE — ED Provider Notes (Signed)
[facility] EMERGENCY DEPARTMENT Provider Note   CSN: [id number] Arrival date & time: 12/26/18  1042     History   Chief Complaint Chief Complaint  Patient presents with   CP/ cough    HPI [name] is a 63 y.o. female with PMHx A fib on Coumadin, CHF, DVT/PE, HTN, Hypothyroidism, Diabetes, CKD stage IV who presents to the ED complaining of gradual onset, constant, dry cough x 5-6 days. Pt also endorses a heaviness in her chest and nausea for the same amount of time. She began having dyspnea on exertion today which is new for patient. She states that she became short winded after doing typical daily activities like putting her shoes on. Pt states she became so short of breath she had to call EMS. No known recent sick contacts. Pt states that she recently had her coumadin dose decreased as her INR level was above 3. 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 States she had an IVC filter placed years ago but it did not work for her and so she has been on anticoagulation since then. Pt also takes Torsemide for her CHF and denies missing any doses. Most recent ECHO 05/09/16 with EF 50-55%. Denies fever, chills, worsening leg swelling, vomiting, diaphoresis, or any other associated symptoms.        Past Medical History:  Diagnosis Date   A-fib ([place])    Anemia    Anxiety    Arthritis    "right hip; both knees; left wrist/shoulder; back" (01/19/2013"   Bleeding on Coumadin 08/2012; 01/18/2013   [facility] admissions (01/19/2013)   CHF (congestive heart failure) ([place])    "2-3 times" (01/19/2013)   Chronic lower back pain    Depression    DVT (deep venous thrombosis) (HCC) 10 years ago   numerous/notes 01/18/2013   GERD (gastroesophageal reflux disease)    Gout    Headache(784.0)    "maybe weekly" (01/19/2013)   Heart murmur    High cholesterol    "been off RX for this at one time" (01/18/2013)   History of blood transfusion 1983; 04/2012   "3 w/  childbirth; hospitalized for pain" (01/19/2013)   Hypertension    Hypothyroidism    Migraines    "twice/yr maybe" (01/19/2013)   OSA (obstructive sleep apnea)    "sent me for test in 04/2012; never ordered mask, etc" (01/19/2013)   PE (pulmonary thromboembolism) ([place]) 3 years ag0   3/notes 01/18/2013   Pneumonia before 2011   "once' (01/18/2013)   Renal disorder    kindey function low; "Metformin was destroying my kidneys" (01/19/2013)   Shortness of breath    "only related to my CHF" (01/18/2013)   Swelling of hand 08/31/2014   RT HAND   Type II diabetes mellitus ([place])    UTI (urinary tract infection) 08/31/2014    Patient Active Problem List   Diagnosis Date Noted   C6 cervical fracture ([place]) 09/01/2018   Atrial flutter ([place]) 09/01/2018   Unspecified atrial fibrillation ([place]) 09/01/2018   Syncope 08/29/2018   Symptomatic advanced heart block 08/29/2018   Influenza    Influenza-like illness    Acute on chronic diastolic congestive heart failure ([place])    Atypical chest pain    Hypoxemia 05/04/2016   Hypoxia 05/04/2016   CHF (congestive heart failure) ([place]) 09/12/2015   COPD exacerbation ([place]) 09/12/2015   Personal history of endocrine, metabolic or immunity disorder 04/03/2015   Decreased mobility 04/03/2015   DVT (deep venous thrombosis),  bilateral 02/24/2015   Long-term (current) use of anticoagulants 11/19/2014   Osteoarthritis 08/31/2014   Cellulitis of hand, right 08/31/2014   Tremors  05/07/2012   Hypoglycemia 05/07/2012   Abdominal pain 05/04/2012   Sleep apnea, has not started titration  05/03/2012   UTI (lower urinary tract infection) 05/03/2012   UTI (urinary tract infection) 05/03/2012   DVT, bil in 08/2011 09/03/2011   Anticoagulated on Coumadin, chronically 09/03/2011   Thrombocytopenia, this admission, 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 08/13/2011   History of pulmonary embolism, recurrent, 3 seperate episodes. 08/13/2011   Presence of IVC filter, placed June 2011 after 3d PE 08/13/2011   Pulmonary HTN, Moderate  PA pressure 26mmHG 09/2009 08/13/2011   Sinus bradycardia 08/13/2011    Past Surgical History:  Procedure Laterality Date   CARDIAC CATHETERIZATION N/A 03/13/2015   Procedure: Right/Left Heart Cath and Coronary Angiography;  Surgeon: Adrian Prows, MD;  Location: [facility];  Service: Cardiovascular;  Laterality: N/A;   CATARACT EXTRACTION W/ INTRAOCULAR LENS  IMPLANT, BILATERAL Bilateral 2006-2011   CESAREAN SECTION  1983   CHOLECYSTECTOMY  ~ 2002   COLONOSCOPY N/A 01/21/2013   Procedure: COLONOSCOPY;  Surgeon: Beryle Beams, MD;  Location: [facility];  Service: Endoscopy;  Laterality: N/A;   EYE SURGERY Bilateral    "multiple" (01/18/2013)   PACEMAKER IMPLANT N/A 08/31/2018   Symptomatic bradycardia due to mobitz II second degree AV block, permanent afib/ atypical atrial flutter implanted by Dr Annye English PLANA REPAIR OF RETINAL DEATACHMENT Right    PARS PLANA VITRECTOMY Bilateral 2004-2006   "several" (01/18/2013)   REFRACTIVE SURGERY Bilateral    "for stigmatism" (01/18/2013)   REFRACTIVE SURGERY Left ~ 11/2012   "to puff it up cause vision got hazy" (01/18/2013)   [place]  2011?     OB History   No obstetric history on file.      Home Medications    Prior to Admission medications   Medication Sig Start  Date End Date Taking? Authorizing Provider  acetaminophen (TYLENOL) 500 MG tablet Take 500 mg by mouth every 6 (six) hours as needed for fever or headache (pain).    [provider]  allopurinol (ZYLOPRIM) 300 MG tablet Take 300 mg by mouth daily.    [provider]  Biotin 1000 MCG tablet Take 1,000 mcg by mouth daily with lunch.     [provider]  Calcium Citrate-Vitamin D (CITRACAL/VITAMIN D PO) Take 2 tablets by mouth every evening.    [provider]  citalopram (CELEXA) 20 MG tablet Take 20 mg by mouth at bedtime.    [provider]  cyclobenzaprine (FLEXERIL) 5 MG tablet Take 1 tablet (5 mg total) by mouth 3 (three) times daily as needed for muscle spasms. 09/03/18   Desiree Hane, MD  diazepam (VALIUM) 5 MG tablet Take 5 mg by mouth at bedtime as needed (back spasms).    [provider]  ferrous sulfate 325 (65 FE) MG tablet Take 325 mg by mouth daily.    [provider]  hydrALAZINE (APRESOLINE) 25 MG tablet HOLD UNTIL SEEN BY YOUR PCP Morning, mid afternoon and bedtime 09/03/18   Oretha Milch D, MD  insulin aspart (NOVOLOG) 100 UNIT/ML injection Inject 2 Units into the skin 3 (three) times daily with meals. Patient taking differently: Inject 6 Units into the skin 3 (three) times daily as needed for high blood sugar (CBG >160).  09/04/14   Velvet Bathe, MD  Insulin Detemir (LEVEMIR FLEXTOUCH) 100 UNIT/ML Pen Inject 16 Units into the skin daily before breakfast.     [provider]  isosorbide mononitrate (IMDUR) 120 MG 24 hr tablet Take 120 mg by mouth daily.    [provider]  levothyroxine (SYNTHROID) 75 MCG tablet Take 75 mcg by mouth daily before breakfast.     [provider]  Melatonin 5 MG TABS Take 5 mg by mouth at bedtime.    [provider]  Multiple Vitamin (MULITIVITAMIN WITH MINERALS) TABS Take 1 tablet by mouth daily.    [provider]  mupirocin cream  (BACTROBAN) 2 % Apply topically 2 (two) times daily. 09/03/18   Desiree Hane, MD  Omega-3 Fatty Acids (FISH OIL) 1000 MG CAPS Take 1,000 mg by mouth daily.     [provider]  rosuvastatin (CRESTOR) 10 MG tablet Take 1 tablet (10 mg total) by mouth daily. Patient taking differently: Take 10 mg by mouth at bedtime.  05/09/16   Rogue Bussing, MD  torsemide (DEMADEX) 20 MG tablet Take 2 tablets (40 mg total) by mouth daily as needed. 09/03/18   Oretha Milch D, MD  warfarin (COUMADIN) 2.5 MG tablet TAKE 7.5 MG ON M-W-F AND 5 MG TUES, THURS, SAT AND SUN AS DIRECTED Patient taking differently: Take 5-7.5 mg by mouth See admin instructions. Take 2 tablets (5 mg) by mouth on Monday,Wednesday, Friday, Saturday at 4pm, take 3 tablets (7.5 mg) on Sunday, Tuesday, Thursday at 4pm 02/10/16   Gordy Levan, MD    Family History Family History  Problem Relation Age of Onset   Cerebral aneurysm Mother    Hypertension Father    Cerebral aneurysm Maternal Grandfather    Cerebral aneurysm Maternal Aunt    Cancer Maternal Uncle     Social History Social History   Tobacco Use   Smoking status: Former Smoker    Packs/day: 0.05    Years: 30.00    Pack years: 1.50    Types: Cigarettes    Quit date: 1990    Years since quitting: 30.7   Smokeless tobacco: Never Used   Tobacco comment: 01/19/2013 "quit smoking cigarettes in the early '90's"  Substance Use Topics   Alcohol use: Not Currently    Comment: rare now but never a heavy drinker   Drug use: No     Allergies   Morphine and related   Review of Systems Review of Systems  Constitutional: Negative for chills and fever.  HENT: Negative for congestion.   Eyes: Negative for visual disturbance.  Respiratory: Positive for cough and shortness of breath.   Cardiovascular: Positive for chest pain.  Gastrointestinal: Positive for nausea. Negative for abdominal pain, constipation, diarrhea and vomiting.    Genitourinary: Negative for difficulty urinating.  Musculoskeletal: Negative for myalgias.  Skin: Negative for rash.  Neurological: Negative for syncope and headaches.     Physical Exam Updated Vital Signs BP (!) 124/59 (  BP Location: Right Arm)    Pulse (!) 59    Temp (!) 97.5 F (36.4 C) (Oral)    Resp 16    SpO2 100%   Physical Exam Vitals signs and nursing note reviewed.  Constitutional:      Appearance: She is obese. She is not ill-appearing.  HENT:     Head: Normocephalic and atraumatic.  Eyes:     Conjunctiva/sclera: Conjunctivae normal.  Neck:     Musculoskeletal: Neck supple.  Cardiovascular:     Rate and Rhythm: Normal rate and regular rhythm.     Pulses: Normal pulses.  Pulmonary:     Effort: Pulmonary effort is normal.     Breath sounds: Normal breath sounds. No wheezing, rhonchi or rales.  Chest:     Chest wall: No tenderness.  Abdominal:     Palpations: Abdomen is soft.     Tenderness: There is no abdominal tenderness. There is no guarding or rebound.  Musculoskeletal:     Right lower leg: Edema present.     Left lower leg: Edema present.     Comments: 1+ pitting edema bilaterally  Skin:    General: Skin is warm and dry.  Neurological:     Mental Status: She is alert.      ED Treatments / Results  Labs (all labs ordered are listed, but only abnormal results are displayed) Labs Reviewed  BASIC METABOLIC PANEL - Abnormal; Notable for the following components:      Result Value   Glucose, Bld 100 (*)    BUN 51 (*)    Creatinine, Ser 2.23 (*)    Calcium 10.4 (*)    GFR calc non Af Amer 23 (*)    GFR calc Af Amer 27 (*)    All other components within normal limits  CBC WITH DIFFERENTIAL/PLATELET - Abnormal; Notable for the following components:   MCV 102.7 (*)    RDW 15.6 (*)    All other components within normal limits  URINALYSIS, ROUTINE W REFLEX MICROSCOPIC - Abnormal; Notable for the following components:   APPearance HAZY (*)    Protein,  ur 100 (*)    Leukocytes,Ua LARGE (*)    WBC, UA >50 (*)    All other components within normal limits  BRAIN NATRIURETIC PEPTIDE - Abnormal; Notable for the following components:   B Natriuretic Peptide 430.9 (*)    All other components within normal limits  PROTIME-INR - Abnormal; Notable for the following components:   Prothrombin Time 26.3 (*)    INR 2.5 (*)    All other components within normal limits  TROPONIN I (HIGH SENSITIVITY) - Abnormal; Notable for the following components:   Troponin I (High Sensitivity) 54 (*)    All other components within normal limits  SARS CORONAVIRUS 2 (TAT 6-24 HRS)  URINE CULTURE  D-DIMER, QUANTITATIVE (NOT AT [facility])  TROPONIN I (HIGH SENSITIVITY)    EKG EKG Interpretation  Date/Time:  Sunday December 26 2018 10:58:56 EDT Ventricular Rate:  73 PR Interval:    QRS Duration: 130 QT Interval:  264 QTC Calculation: 290 R Axis:   -78 Text Interpretation:  Demand pacemaker; interpretation is based on intrinsic rhythm Sinus bradycardia with marked sinus arrhythmia with Premature ventricular complexes or Fusion complexes Left axis deviation Non-specific intra-ventricular conduction block Inferior infarct , age undetermined Cannot rule out Anteroseptal infarct , age undetermined T wave abnormality, consider lateral ischemia Abnormal ECG No significant change since last tracing Confirmed by Lacretia Leigh ([id number]) on  12/26/2018 11:15:55 AM   Radiology Dg Chest Port 1 View  Result Date: 12/26/2018 CLINICAL DATA:  Shortness of breath, chest pain with cough EXAM: PORTABLE CHEST 1 VIEW COMPARISON:  Chest x-rays dated 09/01/2018 and 05/04/2016. FINDINGS: Stable cardiomegaly. Mild central pulmonary vascular congestion and mild bilateral interstitial thickening/edema. No pleural effusion or pneumothorax is seen. Osseous structures about the chest are unremarkable. LEFT chest wall pacemaker/ICD apparatus in place. IMPRESSION: 1. Stable cardiomegaly. 2. Mild  central pulmonary vascular congestion and mild bilateral interstitial thickening/edema suggesting mild volume overload/CHF. Electronically Signed   By: Franki Cabot M.D.   On: 12/26/2018 12:00    Procedures Procedures (including critical care time)  Medications Ordered in ED Medications  cefTRIAXone (ROCEPHIN) 1 g in sodium chloride 0.9 % 100 mL IVPB (has no administration in time range)  furosemide (LASIX) injection 40 mg (40 mg Intravenous Given 12/26/18 1345)     Initial Impression / Assessment and Plan / ED Course  I have reviewed the triage vital signs and the nursing notes.  Pertinent labs & imaging results that were available during my care of the patient were reviewed by me and considered in my medical decision making (see chart for details).  Clinical Course as of Dec 26 1450  Sun Dec 26, 2018  1449 Discussed case with Dr. Tamala Julian who agrees to accept patient for admission   [MV]    Clinical Course User Index [MV] Eustaquio Maize, PA-C   63 year old female who presents today complaining of a dry cough, chest pain, shortness of breath, nausea, "bad taste in mouth."  No recent sick contacts.  Patient is afebrile in the ED without tachycardia or tachypnea.  He has extensive history for blood clots in her legs and her lungs with failed IVC filter in 2011.  Currently anticoagulated.  States she recently had her Coumadin level decreased after INR above 3.  Also on fluid pills for congestive heart failure.  States she has been compliant.  She does have 1+ pitting edema in her legs but states that this is unchanged from previous.  Differential diagnosis includes congestive heart failure exacerbation, ACS, PE, pneumonia, other viral illness including COVID-19.  Obtain baseline screening labs, EKG, chest x-ray and reevaluate patient after labs and imaging return.   X-ray with findings of pulmonary vascular congestion.  Will start patient on 40 mg IV Lasix.  Leukocytosis.  Hemoglobin stable.   Creatinine mildly elevated compared to previous.   Lab Results  Component Value Date   CREATININE 2.23 (H) 12/26/2018   CREATININE 1.64 (H) 09/03/2018   CREATININE 1.87 (H) 09/01/2018   D-dimer negative.  Initial troponin 54.  Will repeat.  Suspect this is high from congestive heart failure exacerbation.  BNP elevated at 430.   Urinalysis with leuks and greater than 50 white blood cells.  When discussing this with patient she reports that last week she had a couple of days of dysuria that went away.  She states that she went to her PCP and they checked her urine and said that it was clear.  Given she had symptoms last week and it appears infectious we will send for culture and start on 1 g ceftriaxone.  Will consult hospitalist for admission at this time given congestive heart failure exacerbation and AKI.   This note was prepared using Dragon voice recognition software and may include unintentional dictation errors due to the inherent limitations of voice recognition software.    Final Clinical Impressions(s) / ED Diagnoses   Final  diagnoses:  Acute on chronic congestive heart failure, unspecified heart failure type ([place])  AKI (acute kidney injury) ([place])  Urinary tract infection without hematuria, site unspecified    ED Discharge Orders    None       Eustaquio Maize, PA-C 12/26/18 1459    Lacretia Leigh, MD 12/29/18 [phone]

## 2018-12-26 NOTE — ED Notes (Signed)
[employer] called. Everything looks good with battery and lead measurements and appeared to be functioning as it should.

## 2018-12-26 NOTE — ED Notes (Signed)
Pacemaker interrogated. 

## 2018-12-26 NOTE — Plan of Care (Signed)

## 2018-12-26 NOTE — H&P (Addendum)
History and Physical    [name] [id number] DOB: 1955/11/28 DOA: 12/26/2018  Referring MD/NP/PA: Holley Raring, MD PCP: [name], MD  Patient coming from: Home via EMS  Chief Complaint: Cough, shortness of breath, chest pain  I have personally briefly reviewed patient's old medical records in [facility]   HPI: [name] is a 63 y.o. female with medical history HTN, HLD, DM, CHF last EF 50-55%, A. fib on Coumadin, complete heart block  s/p PM, history of DVT/PE, and hypothyroidism; who presents with complaints of progressively worsening dry cough, shortness of breath, and chest pain.  She reports having dry cough and shortness of breath progressively worsening over the last week.  Associated symptoms include chest heaviness, fatigue, rhinorrhea, poor appetite, nausea, change in taste, dysuria couple days ago, and dizziness.  She had recently went to a cookout on Labor Day weekend with a bunch of people where she and or the day where mask.  Denies having any significant fever, vomiting, leg swelling or diarrhea.  Of note patient did fall a week and a half ago and bruised her right side.  Patient required pacemaker placement back in May of this year by Dr. Rayann Heman.  She was recently scheduled an in office visit because she was told that her pacemaker needed reprogramming.  Today when she woke up she just did not feel right and reported that it took her a very long time to even put her clothes.  At some point she felt as though she could not catch her breath told her husband to call EMS.  ED Course: Upon admission into the emergency department patient was seen to be afebrile and hemodynamically stable.  Labs significant for BUN 51, creatinine 2.3, calcium 10.4, BNP 430.9, INR 2.5, and high-sensitivity troponin 54->54.  Urinalysis was positive for large leukocytes no bacteria seen and greater than 50 WBCs.  COVID-19 screening pending.  Chest x-ray showing signs of mild volume  overload.  Patient was given 40 mg of Lasix IV and 1 g of Rocephin for suspected urinary tract infection.  TRH called to admit  Review of systems: A complete 10 point review of systems was performed and negative except for as noted above in HPI.   Past Medical History:  Diagnosis Date  . A-fib ([place])   . Anemia   . Anxiety   . Arthritis    "right hip; both knees; left wrist/shoulder; back" (01/19/2013"  . Bleeding on Coumadin 08/2012; 01/18/2013   BRBPR admissions (01/19/2013)  . CHF (congestive heart failure) ([place])    "2-3 times" (01/19/2013)  . Chronic lower back pain   . Depression   . DVT (deep venous thrombosis) ([place]) 10 years ago   numerous/notes 01/18/2013  . GERD (gastroesophageal reflux disease)   . Gout   . [id number])    "maybe weekly" (01/19/2013)  . Heart murmur   . High cholesterol    "been off RX for this at one time" (01/18/2013)  . History of blood transfusion 1983; 04/2012   "3 w/ childbirth; hospitalized for pain" (01/19/2013)  . Hypertension   . Hypothyroidism   . Migraines    "twice/yr maybe" (01/19/2013)  . OSA (obstructive sleep apnea)    "sent me for test in 04/2012; never ordered mask, etc" (01/19/2013)  . PE (pulmonary thromboembolism) ([place]) 3 years ag0   3/notes 01/18/2013  . Pneumonia before 2011   "once' (01/18/2013)  . Renal disorder    kindey function low; "Metformin was destroying my  kidneys" (01/19/2013)  . Shortness of breath    "only related to my CHF" (01/18/2013)  . Swelling of hand 08/31/2014   RT HAND  . Type II diabetes mellitus ([place])   . UTI (urinary tract infection) 08/31/2014    Past Surgical History:  Procedure Laterality Date  . CARDIAC CATHETERIZATION N/A 03/13/2015   Procedure: Right/Left Heart Cath and Coronary Angiography;  Surgeon: Adrian Prows, MD;  Location: [facility];  Service: Cardiovascular;  Laterality: N/A;  . CATARACT EXTRACTION W/ INTRAOCULAR LENS  IMPLANT, BILATERAL Bilateral 2006-2011  . CESAREAN  SECTION  1983  . CHOLECYSTECTOMY  ~ 2002  . COLONOSCOPY N/A 01/21/2013   Procedure: COLONOSCOPY;  Surgeon: Beryle Beams, MD;  Location: [facility];  Service: Endoscopy;  Laterality: N/A;  . EYE SURGERY Bilateral    "multiple" (01/18/2013)  . PACEMAKER IMPLANT N/A 08/31/2018   Symptomatic bradycardia due to mobitz II second degree AV block, permanent afib/ atypical atrial flutter implanted by Dr Rayann Heman  . PARS PLANA REPAIR OF RETINAL DEATACHMENT Right   . PARS PLANA VITRECTOMY Bilateral 2004-2006   "several" (01/18/2013)  . REFRACTIVE SURGERY Bilateral    "for stigmatism" (01/18/2013)  . REFRACTIVE SURGERY Left ~ 11/2012   "to puff it up cause vision got hazy" (01/18/2013)  . VENA CAVA FILTER PLACEMENT  2011?     reports that she quit smoking about 30 years ago. Her smoking use included cigarettes. She has a 1.50 pack-year smoking history. She has never used smokeless tobacco. She reports previous alcohol use. She reports that she does not use drugs.  Allergies  Allergen Reactions  . Morphine And Related Rash    Family History  Problem Relation Age of Onset  . Cerebral aneurysm Mother   . Hypertension Father   . Cerebral aneurysm Maternal Grandfather   . Cerebral aneurysm Maternal Aunt   . Cancer Maternal Uncle     Prior to Admission medications   Medication Sig Start Date End Date Taking? Authorizing Provider  acetaminophen (TYLENOL) 500 MG tablet Take 500 mg by mouth every 6 (six) hours as needed for fever or headache (pain).    [provider]  allopurinol (ZYLOPRIM) 300 MG tablet Take 300 mg by mouth daily.    [provider]  Biotin 1000 MCG tablet Take 1,000 mcg by mouth daily with lunch.     [provider]  Calcium Citrate-Vitamin D (CITRACAL/VITAMIN D PO) Take 2 tablets by mouth every evening.    [provider]  citalopram (CELEXA) 20 MG tablet Take 20 mg by mouth at bedtime.    [provider]  cyclobenzaprine  (FLEXERIL) 5 MG tablet Take 1 tablet (5 mg total) by mouth 3 (three) times daily as needed for muscle spasms. 09/03/18   Desiree Hane, MD  diazepam (VALIUM) 5 MG tablet Take 5 mg by mouth at bedtime as needed (back spasms).    [provider]  ferrous sulfate 325 (65 FE) MG tablet Take 325 mg by mouth daily.    [provider]  hydrALAZINE (APRESOLINE) 25 MG tablet HOLD UNTIL SEEN BY YOUR PCP Morning, mid afternoon and bedtime 09/03/18   Oretha Milch D, MD  insulin aspart (NOVOLOG) 100 UNIT/ML injection Inject 2 Units into the skin 3 (three) times daily with meals. Patient taking differently: Inject 6 Units into the skin 3 (three) times daily as needed for high blood sugar (CBG >160).  09/04/14   Velvet Bathe, MD  Insulin Detemir (LEVEMIR FLEXTOUCH) 100 UNIT/ML  Pen Inject 16 Units into the skin daily before breakfast.     [provider]  isosorbide mononitrate (IMDUR) 120 MG 24 hr tablet Take 120 mg by mouth daily.    [provider]  levothyroxine (SYNTHROID) 75 MCG tablet Take 75 mcg by mouth daily before breakfast.     [provider]  Melatonin 5 MG TABS Take 5 mg by mouth at bedtime.    [provider]  Multiple Vitamin (MULITIVITAMIN WITH MINERALS) TABS Take 1 tablet by mouth daily.    [provider]  mupirocin cream (BACTROBAN) 2 % Apply topically 2 (two) times daily. 09/03/18   Desiree Hane, MD  Omega-3 Fatty Acids (FISH OIL) 1000 MG CAPS Take 1,000 mg by mouth daily.     [provider]  rosuvastatin (CRESTOR) 10 MG tablet Take 1 tablet (10 mg total) by mouth daily. Patient taking differently: Take 10 mg by mouth at bedtime.  05/09/16   Rogue Bussing, MD  torsemide (DEMADEX) 20 MG tablet Take 2 tablets (40 mg total) by mouth daily as needed. 09/03/18   Oretha Milch D, MD  warfarin (COUMADIN) 2.5 MG tablet TAKE 7.5 MG ON M-W-F AND 5 MG TUES, THURS, SAT AND SUN AS DIRECTED Patient taking differently:  Take 5-7.5 mg by mouth See admin instructions. Take 2 tablets (5 mg) by mouth on Monday,Wednesday, Friday, Saturday at 4pm, take 3 tablets (7.5 mg) on Sunday, Tuesday, Thursday at 4pm 02/10/16   Gordy Levan, MD    Physical Exam:  Constitutional: Morbidly obese female appears to be in no acute distress at this time Vitals:   12/26/18 1054 12/26/18 1338  BP: (!) 124/59 (!) 157/72  Pulse: (!) 59 81  Resp: 16 14  Temp: (!) 97.5 F (36.4 C)   TempSrc: Oral   SpO2: 100% 100%   Eyes: PERRL, lids and conjunctivae normal ENMT: Mucous membranes are moist. Posterior pharynx clear of any exudate or lesions.   Neck: normal, supple, no masses, no thyromegaly.  No significant JVD appreciated Respiratory: clear to auscultation bilaterally, no wheezing, no crackles. Normal respiratory effort. No accessory muscle use.  Cardiovascular: Bradycardic, no murmurs / rubs / gallops. No extremity edema. 2+ pedal pulses. No carotid bruits.  Abdomen: no tenderness, no masses palpated. No hepatosplenomegaly. Bowel sounds positive.  Musculoskeletal: no clubbing / cyanosis. No joint deformity upper and lower extremities. Good ROM, no contractures. Normal muscle tone.  Skin: no rashes, lesions, ulcers. No induration Neurologic: CN 2-12 grossly intact. Sensation intact, DTR normal. Strength 5/5 in all 4.  Psychiatric: Normal judgment and insight. Alert and oriented x 3. Normal mood.     Labs on Admission: I have personally reviewed following labs and imaging studies  CBC: Recent Labs  Lab 12/26/18 1212  WBC 6.0  NEUTROABS 4.1  HGB 13.1  HCT 42.3  MCV 102.7*  PLT [id number]   Basic Metabolic Panel: Recent Labs  Lab 12/26/18 1212  NA 139  K 4.7  CL 99  CO2 28  GLUCOSE 100*  BUN 51*  CREATININE 2.23*  CALCIUM 10.4*   GFR: CrCl cannot be calculated (Unknown ideal weight.). Liver Function Tests: No results for input(s): AST, ALT, ALKPHOS, BILITOT, PROT, ALBUMIN in the last 168 hours. No results  for input(s): LIPASE, AMYLASE in the last 168 hours. No results for input(s): AMMONIA in the last 168 hours. Coagulation Profile: Recent Labs  Lab 12/26/18 1212  INR 2.5*   Cardiac Enzymes: No results for input(s): CKTOTAL, CKMB,  CKMBINDEX, TROPONINI in the last 168 hours. BNP (last 3 results) No results for input(s): PROBNP in the last 8760 hours. HbA1C: No results for input(s): HGBA1C in the last 72 hours. CBG: No results for input(s): GLUCAP in the last 168 hours. Lipid Profile: No results for input(s): CHOL, HDL, LDLCALC, TRIG, CHOLHDL, LDLDIRECT in the last 72 hours. Thyroid Function Tests: No results for input(s): TSH, T4TOTAL, FREET4, T3FREE, THYROIDAB in the last 72 hours. Anemia Panel: No results for input(s): VITAMINB12, FOLATE, FERRITIN, TIBC, IRON, RETICCTPCT in the last 72 hours. Urine analysis:    Component Value Date/Time   COLORURINE YELLOW 12/26/2018 1343   APPEARANCEUR HAZY (A) 12/26/2018 1343   LABSPEC 1.011 12/26/2018 1343   PHURINE 8.0 12/26/2018 1343   GLUCOSEU NEGATIVE 12/26/2018 1343   HGBUR NEGATIVE 12/26/2018 1343   BILIRUBINUR NEGATIVE 12/26/2018 1343   KETONESUR NEGATIVE 12/26/2018 1343   PROTEINUR 100 (A) 12/26/2018 1343   UROBILINOGEN 0.2 08/31/2014 1158   NITRITE NEGATIVE 12/26/2018 1343   LEUKOCYTESUR LARGE (A) 12/26/2018 1343   Sepsis Labs: No results found for this or any previous visit (from the past 240 hour(s)).   Radiological Exams on Admission: Dg Chest Port 1 View  Result Date: 12/26/2018 CLINICAL DATA:  Shortness of breath, chest pain with cough EXAM: PORTABLE CHEST 1 VIEW COMPARISON:  Chest x-rays dated 09/01/2018 and 05/04/2016. FINDINGS: Stable cardiomegaly. Mild central pulmonary vascular congestion and mild bilateral interstitial thickening/edema. No pleural effusion or pneumothorax is seen. Osseous structures about the chest are unremarkable. LEFT chest wall pacemaker/ICD apparatus in place. IMPRESSION: 1. Stable cardiomegaly.  2. Mild central pulmonary vascular congestion and mild bilateral interstitial thickening/edema suggesting mild volume overload/CHF. Electronically Signed   By: Franki Cabot M.D.   On: 12/26/2018 12:00    EKG: Independently reviewed.  Paced rhythm  Assessment/Plan Congestive heart failure exacerbation: Acute on chronic.  Patient presents with complaints of worsening shortness of breath chest pain and cough.  Chest x-ray showing central venous congestion with mild bilateral interstitial edema suggesting fluid overload.  BNP 430.9.  Last echo revealed EF of 50- 55% back in 2018 -Admit to a telemetry bed -Heart failure orders set  initiated  -Continuous pulse oximetry with nasal cannula oxygen as needed to keep O2 saturations >92% -Strict I&Os and daily weights -Elevate lower extremities -Lasix 60 mg IV Bid -Reassess in a.m. and adjust diuresis as needed. -Check echocardiogram -Optimize medical management as able -Message sent for cardiology to evaluate in a.m.  Chest pain and elevated troponin: High-sensitivity troponin 54->54.  Patient complained of chest pain. -Follow-up telemetry  Acute kidney injury superimposed on chronic kidney disease stage III: On admission creatinine elevated at 2.23 with BUN 51.  Baseline creatinine previously noted to be around 1.64. -Continue to monitor kidney function with diuresis   UTI: Present on admission.  Patient had complained of symptoms of dysuria couple days ago.  Urinalysis positive for leukocytes and greater than 50 WBCs.  Significant signs of -Follow-up urine culture -Continue Rocephin  S/p pacemaker: Patient reports that she was recently told by Dr. Shelly Bombard that she need to come into the office to have her pacemaker reprogrammed. -Pacemaker to be interrogated -Message sent for cardiology to eval  COPD: Patient without any acute signs of exacerbation at this time. -Albuterol as needed  Diabetes mellitus type 2: Last hemoglobin A1c 6.3 on  08/29/2018. -Hypoglycemic protocol -Decrease home Lantus from 20 units down to 10 units while hospitalized -Continue home regimen of NovoLog 8 units with meals -Adjust  insulin regimen  Anxiety/depression -Continue Celexa  Essential hypertension: Blood pressure currently stable -Continue hydralazine and isosorbide mononitrate  Hypothyroidism: Last TSH 1.773 on 08/29/2018. -Continue levothyroxine  Hyperlipidemia -Continue Crestor  History of recurrent PE/DVT, paroxysmal atrial fibrillation, on chronic anticoagulation: INR therapeutic at 2.5 on admission. -Continue Coumadin per pharmacy  COVID-19 screening: We will need to follow-up COVID-19 screening as patient reported changes in taste with rhinorrhea   DVT prophylaxis: coumadin    Code Status: Full Family Communication: No family present at bedside Disposition Plan: Likely discharge home in 2 to 3 days Consults called: none  Admission status: Inpatient  Norval Morton MD [facility] Pager [phone]   If 7PM-7AM, please contact night-coverage [email or web address] Password [email or web address]  12/26/2018, 2:45 PM

## 2018-12-27 ENCOUNTER — Inpatient Hospital Stay (HOSPITAL_COMMUNITY): Payer: Medicare HMO

## 2018-12-27 ENCOUNTER — Encounter (HOSPITAL_COMMUNITY): Payer: Self-pay

## 2018-12-27 DIAGNOSIS — I5021 Acute systolic (congestive) heart failure: Secondary | ICD-10-CM

## 2018-12-27 LAB — BASIC METABOLIC PANEL
Anion gap: 12 (ref 5–15)
BUN: 55 mg/dL — ABNORMAL HIGH (ref 8–23)
CO2: 31 mmol/L (ref 22–32)
Calcium: 9.7 mg/dL (ref 8.9–10.3)
Chloride: 97 mmol/L — ABNORMAL LOW (ref 98–111)
Creatinine, Ser: 2.62 mg/dL — ABNORMAL HIGH (ref 0.44–1.00)
GFR calc Af Amer: 22 mL/min — ABNORMAL LOW (ref >60)
GFR calc non Af Amer: 19 mL/min — ABNORMAL LOW (ref >60)
Glucose, Bld: 93 mg/dL (ref 70–99)
Potassium: 4.5 mmol/L (ref 3.5–5.1)
Sodium: 140 mmol/L (ref 135–145)

## 2018-12-27 LAB — CBC WITH DIFFERENTIAL/PLATELET
Abs Immature Granulocytes: 0.02 10*3/uL (ref 0.00–0.07)
Basophils Absolute: 0.1 10*3/uL (ref 0.0–0.1)
Basophils Relative: 1 %
Eosinophils Absolute: 0.5 10*3/uL (ref 0.0–0.5)
Eosinophils Relative: 9 %
HCT: 41.3 % (ref 36.0–46.0)
Hemoglobin: 13.2 g/dL (ref 12.0–15.0)
Immature Granulocytes: 0 %
Lymphocytes Relative: 17 %
Lymphs Abs: 1.1 10*3/uL (ref 0.7–4.0)
MCH: 32.4 pg (ref 26.0–34.0)
MCHC: 32 g/dL (ref 30.0–36.0)
MCV: 101.2 fL — ABNORMAL HIGH (ref 80.0–100.0)
Monocytes Absolute: 0.6 10*3/uL (ref 0.1–1.0)
Monocytes Relative: 11 %
Neutro Abs: 3.8 10*3/uL (ref 1.7–7.7)
Neutrophils Relative %: 62 %
Platelets: 162 10*3/uL (ref 150–400)
RBC: 4.08 MIL/uL (ref 3.87–5.11)
RDW: 15.7 % — ABNORMAL HIGH (ref 11.5–15.5)
WBC: 6.1 10*3/uL (ref 4.0–10.5)
nRBC: 0 % (ref 0.0–0.2)

## 2018-12-27 LAB — PROTIME-INR
INR: 2.5 — ABNORMAL HIGH (ref 0.8–1.2)
Prothrombin Time: 26.9 seconds — ABNORMAL HIGH (ref 11.4–15.2)

## 2018-12-27 LAB — ECHOCARDIOGRAM COMPLETE
Height: 62 in
Weight: 4057.6 oz

## 2018-12-27 LAB — GLUCOSE, CAPILLARY
Glucose-Capillary: 114 mg/dL — ABNORMAL HIGH (ref 70–99)
Glucose-Capillary: 113 mg/dL — ABNORMAL HIGH (ref 70–99)
Glucose-Capillary: 148 mg/dL — ABNORMAL HIGH (ref 70–99)

## 2018-12-27 LAB — SARS CORONAVIRUS 2 (TAT 6-24 HRS): SARS Coronavirus 2: NEGATIVE

## 2018-12-27 MED ORDER — HYDROCODONE-ACETAMINOPHEN 5-325 MG PO TABS
1.0000 | ORAL_TABLET | ORAL | Status: DC | PRN
  Administered 2018-12-29: 2 via ORAL
  Filled 2018-12-27: qty 2

## 2018-12-27 MED ORDER — FUROSEMIDE 10 MG/ML IJ SOLN
40.0000 mg | Freq: Every day | INTRAMUSCULAR | Status: DC
  Administered 2018-12-28: 40 mg via INTRAVENOUS
  Filled 2018-12-27: qty 4

## 2018-12-27 MED ORDER — WARFARIN SODIUM 5 MG PO TABS
5.0000 mg | ORAL_TABLET | Freq: Once | ORAL | Status: AC
  Administered 2018-12-27: 5 mg via ORAL
  Filled 2018-12-27: qty 1

## 2018-12-27 NOTE — Progress Notes (Signed)
[facility] for warfarin Indication: atrial fibrillation  Allergies  Allergen Reactions  . Morphine And Related Rash    Patient Measurements: Height: 5\' 2"  (157.5 cm) Weight: 253 lb 9.6 oz (115 kg) IBW/kg (Calculated) : 50.1  Vital Signs: Temp: 98 F (36.7 C) (09/21 1135) Temp Source: Oral (09/21 1135) BP: 133/81 (09/21 1135) Pulse Rate: 62 (09/21 1135)  Labs: Recent Labs    12/26/18 1212 12/26/18 1343 12/27/18 0539  HGB 13.1  --  13.2  HCT 42.3  --  41.3  PLT 163  --  162  LABPROT 26.3*  --  26.9*  INR 2.5*  --  2.5*  CREATININE 2.23*  --  2.62*  TROPONINIHS 54* 54*  --     Estimated Creatinine Clearance: 26.7 mL/min (A) (by C-G formula based on SCr of 2.62 mg/dL (H)).   Medical History: Past Medical History:  Diagnosis Date  . A-fib ([place])   . Anemia   . Anxiety   . Arthritis    "right hip; both knees; left wrist/shoulder; back" (01/19/2013"  . Bleeding on Coumadin 08/2012; 01/18/2013   BRBPR admissions (01/19/2013)  . CHF (congestive heart failure) ([place])    "2-3 times" (01/19/2013)  . Chronic lower back pain   . Depression   . DVT (deep venous thrombosis) ([place]) 10 years ago   numerous/notes 01/18/2013  . GERD (gastroesophageal reflux disease)   . Gout   . [id number])    "maybe weekly" (01/19/2013)  . Heart murmur   . High cholesterol    "been off RX for this at one time" (01/18/2013)  . History of blood transfusion 1983; 04/2012   "3 w/ childbirth; hospitalized for pain" (01/19/2013)  . Hypertension   . Hypothyroidism   . Migraines    "twice/yr maybe" (01/19/2013)  . OSA (obstructive sleep apnea)    "sent me for test in 04/2012; never ordered mask, etc" (01/19/2013)  . PE (pulmonary thromboembolism) ([place]) 3 years ag0   3/notes 01/18/2013  . Pneumonia before 2011   "once' (01/18/2013)  . Renal disorder    kindey function low; "Metformin was destroying my kidneys" (01/19/2013)  . Shortness of breath    "only related to my CHF" (01/18/2013)  . Swelling of hand 08/31/2014   RT HAND  . Type II diabetes mellitus ([place])   . UTI (urinary tract infection) 08/31/2014   Assessment: 61 yof presented to the ED with cough, shortness of breath and CP. She is on chronic warfarin for history of afib. INR is therapeutic at 2.5. CBC is WNL and no bleeding noted.   PTA dose: 5mg  daily except 7.5 mg on Tues + Thurs  Goal of Therapy:  INR 2-3 Monitor platelets by anticoagulation protocol: Yes   Plan:  Warfarin 5mg  PO x 1 tonight (consistent with home dose) Daily INR  Marguerite Olea, [facility] Clinical Pharmacist Phone [phone]  12/27/2018 1:17 PM

## 2018-12-27 NOTE — Progress Notes (Signed)
Called to room by NT. NT was assisting pt to bathroom when pt had a syncopal episode, becoming dizzy and fell back onto toilet, hitting upper back on pipes behind toilet. Denies loss of consciousness. C/o upper back pain laterally across shoulders. BP 150/76, HR 63. Tele unremarkable. Pt also c/o left ant ankle pain but unsure if she hurt ankle during incident. Assisted back to bed. MD on call paged.

## 2018-12-27 NOTE — TOC Initial Note (Addendum)
Transition of Care [facility]) - Initial/Assessment Note    Patient Details  Name: [name] MRN: [id number] Date of Birth: Oct 18, 1955  [facility]) CM/SW Contact:    Alberteen Sam, [place] Phone Number: [phone] 12/27/2018, 1:25 PM  Clinical Narrative:                  Update: [facility] has accepted patient for Home Health RN services.   CSW met with patient with husband at bedside regarding any home health needs. Patient acceptable of receiving Home Health RN services for symtpom management. She reports she has used several different home health agencies in the past, but does not have a preference of agency at this time.   CSW sending referrals to home health agencies for [place]. Pending acceptance at this time.   Expected Discharge Plan: [place] Barriers to Discharge: Continued Medical Work up   Patient Goals and CMS Choice Patient states their goals for this hospitalization and ongoing recovery are:: to go home CMS Medicare.gov Compare Post Acute Care list provided to:: Patient Choice offered to / list presented to : Patient  Expected Discharge Plan and Services Expected Discharge Plan: [place] Acute Care Choice: [place] arrangements for the past 2 months: Single Family Home                                      Prior Living Arrangements/Services Living arrangements for the past 2 months: Single Family Home Lives with:: Spouse Patient language and need for interpreter reviewed:: Yes Do you feel safe going back to the place where you live?: Yes      Need for Family Participation in Patient Care: Yes (Comment) Care giver support system in place?: Yes (comment)   Criminal Activity/Legal Involvement Pertinent to Current Situation/Hospitalization: No - Comment as needed  Activities of Daily Living Home Assistive Devices/Equipment: Cane (specify quad or straight), Walker (specify  type) ADL Screening (condition at time of admission) Patient's cognitive ability adequate to safely complete daily activities?: Yes Is the patient deaf or have difficulty hearing?: No Does the patient have difficulty seeing, even when wearing glasses/contacts?: No Does the patient have difficulty concentrating, remembering, or making decisions?: No Patient able to express need for assistance with ADLs?: Yes Does the patient have difficulty dressing or bathing?: No(not before illness ) Independently performs ADLs?: Yes (appropriate for developmental age) Does the patient have difficulty walking or climbing stairs?: Yes(uses cane of walker ) Weakness of Legs: Both Weakness of Arms/Hands: Both(wears braces )  Permission Sought/Granted Permission sought to share information with : Case Freight forwarder, Customer service manager, Family Supports Permission granted to share information with : Yes, Verbal Permission Granted  Share Information with NAME: Herbie [place]  Permission granted to share info w AGENCY: [place] granted to share info w Relationship: spouse  Permission granted to share info w Contact Information: [phone]  Emotional Assessment Appearance:: Appears stated age Attitude/Demeanor/Rapport: Gracious Affect (typically observed): Calm Orientation: : Oriented to Self, Oriented to Place, Oriented to  Time, Oriented to Situation Alcohol / Substance Use: Not Applicable Psych Involvement: No (comment)  Admission diagnosis:  AKI (acute kidney injury) ([place]) [N17.9] Urinary tract infection without hematuria, site unspecified [N39.0] Acute on chronic congestive heart failure, unspecified heart failure type [facility]) [I50.9] Patient Active Problem List   Diagnosis  Date Noted  . C6 cervical fracture ([place]) 09/01/2018  . Atrial flutter ([place]) 09/01/2018  . Unspecified atrial fibrillation ([place]) 09/01/2018  . Syncope 08/29/2018  . Symptomatic advanced heart block  08/29/2018  . Influenza   . Influenza-like illness   . Acute on chronic diastolic congestive heart failure ([place])   . Atypical chest pain   . Hypoxemia 05/04/2016  . Hypoxia 05/04/2016  . CHF (congestive heart failure) ([place]) 09/12/2015  . COPD exacerbation ([place]) 09/12/2015  . Personal history of endocrine, metabolic or immunity disorder 04/03/2015  . Decreased mobility 04/03/2015  . DVT (deep venous thrombosis), bilateral 02/24/2015  . Long term current use of anticoagulant therapy 11/19/2014  . Osteoarthritis 08/31/2014  . Cellulitis of hand, right 08/31/2014  . Tremors  05/07/2012  . Hypoglycemia 05/07/2012  . Abdominal pain 05/04/2012  . Sleep apnea, has not started titration  05/03/2012  . Acute lower UTI 05/03/2012  . UTI (urinary tract infection) 05/03/2012  . DVT, bil in 08/2011 09/03/2011  . Anticoagulated on Coumadin, chronically 09/03/2011  . 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 Rectal bleeding 08/16/2011  . Diastolic dysfunction, grade 2, EF 65-70% May 2013 08/14/2011  . LBBB (left bundle branch block) 08/14/2011  . Chronic renal insufficiency, stage IV (severe) - due to DM & HTN, SCr improving-1.6 08/14/2011    Class: Chronic  . Angina pectoris ([place]) 08/13/2011  . IDDM (insulin dependent diabetes mellitus) ([place]) 08/13/2011  . Morbid obesity, + sleep apnea 08/13/2011  . HTN (hypertension) 08/13/2011  . HLD (hyperlipidemia) 08/13/2011  . Anemia - due to GI bleed & chronic disease. 08/13/2011  . History of pulmonary embolism, recurrent, 3 seperate episodes. 08/13/2011  . Presence of IVC filter, placed June 2011 after 3d PE 08/13/2011  . Pulmonary HTN, Moderate  PA pressure 65mHG 09/2009 08/13/2011  . Sinus bradycardia 08/13/2011   PCP:  MJilda Panda MD Pharmacy:   [id number] [place] [id number] [id number]:  [phone]: [phone]   Social Determinants of Health (SDOH) Interventions    Readmission Risk Interventions Readmission Risk Prevention Plan 09/03/2018  Transportation Screening Complete  PCP or Specialist Appt within 3-5 Days Complete  HRI or [place]  Social Work Consult for [place]/Counseling Complete  Palliative Care Screening Not Applicable  Medication Review (RPress photographerComplete  Some recent data might be hidden

## 2018-12-27 NOTE — Progress Notes (Signed)
  Echocardiogram 2D Echocardiogram has been performed.  Jennette Dubin 12/27/2018, 9:29 AM

## 2018-12-27 NOTE — Progress Notes (Signed)
PROGRESS NOTE    [name]  [id number] DOB: [birth year] DOA: 12/26/2018 PCP: [name], MD   Brief Narrative: As per H&P:  63 y.o. female with medical history HTN, HLD, DM, CHF last EF 50-55%, A. fib on Coumadin, complete heart block  s/p PM, history of DVT/PE, and hypothyroidism; who presents with complaints of progressively worsening dry cough, shortness of breath, and chest pain.  She reports having dry cough and shortness of breath progressively worsening over the last week.  Associated symptoms include chest heaviness, fatigue, rhinorrhea, poor appetite, nausea, change in taste, dysuria couple days ago, and dizziness.  She had recently went to a cookout on Labor Day weekend with a bunch of people where she and or the day where mask.  Denies having any significant fever, vomiting, leg swelling or diarrhea.  Of note patient did fall a week and a half ago and bruised her right side.  Patient required pacemaker placement back in May of this year by Dr. Rayann Heman.  She was recently scheduled an in office visit because she was told that her pacemaker needed reprogramming.  Today when she woke up she just did not feel right and reported that it took her a very long time to even put her clothes.  At some point she felt as though she could not catch her breath told her husband to call EMS. IN [id number] and hemodynamically stable.  Labs: BUN 51, creatinine 2.3, calcium 10.4, BNP 430.9, INR 2.5, and high-sensitivity troponin 54->54.  Urinalysis was positive for large leukocytes no bacteria seen and greater than 50 WBCs.  COVID-19 screening negative. Chest x-ray showing signs of mild volume overload.  Patient was given 40 mg of Lasix IV and 1 g of Rocephin for suspected urinary tract infection.  [facility] called to admit  Subjective: Doing well, no chest pain, sob is improving, on RA was up and brushing her teeth Normal wt around 245 in past few months per patient. Reports her Weight at [place] office  on 9/8 was 268 and was 251 in ER but was short of breath and face was puffy. Was taking torsemide at home. Was feeling unwell and having generalized weakness Reports feeling stronger and had good sleep Reported cough this am-dry  Assessment & Plan:   Acute on chronic diastolic congestive heart failure: Patient presenting with generalized weakness shortness of breath, chest x-ray showed signs of mild volume overload: With Lasix IV overnight patient feeling much improved.  Echocardiogram done-report pending.  BNP 430.9.  Last echo with EF of 50 to 50% in 2018.  Continue intake output monitoring, daily weight.  Cut back on Lasix given that creatinine is worsening. Normal wt around 245lb per her  in past few months.Reports her Weight at [place] office on 9/8 was 268 and was 251 in ER.  Cardiology consulted for further recommendation.  UTI POA with dysuria symptoms recently.  Follow-up urine culture and continue ceftriaxone short course.  IDDM: Last hemoglobin A1c back in May was 6.3.  Blood sugar well controlled on continue Lantus and sliding scale insulin. Recent Labs  Lab 12/27/18 1105  GLUCAP 114*   Morbid obesity with + sleep apnea: Advised weight loss and lifestyle and outpatient follow-up.  Chest pain and elevated troponin: high-sensitivity troponin 54->54.  Patient complained of chest pain.  Monitoring telemetry this morning chest pain-free alert awake oriented.  Cardiology is on consult.  Anticoagulation coumadini, Crestor.  Follow-up echocardiogram.  Acute kidney injury in the setting of CKD stage III, baseline  creatinine around 1.6 on admission 2.2 , trending up to 2.6 in the setting of IV Lasix use.  Will cut back on Lasix since she is having clinical improvement.  Monitor. Recent Labs  Lab 12/26/18 1212 12/27/18 0539  BUN 51* 55*  CREATININE 2.23* 2.62*   History of pacemaker placement consulted cardio- pssible PM interrogation  COPD: stable, cont  albuterol   Anxiety/depression;stable, continue Celexa  Essential hypertension: BP controlled cont hydralazine and isosorbide mononitrate  Hypothyroidism: Last TSH 1.773 on 08/29/2018.Continue levothyroxine  Hyperlipidemia-cont crestor  History of recurrent PE/DVT, paroxysmal atrial fibrillation, on chronic anticoagulation: INR therapeutic at 2.5 on admission.  Coumadin pharmacy dosing.   DVT prophylaxis: coumadin    Code Status: Full Family Communication: No family present at bedside    Body mass index is 46.38 kg/m.    DVT prophylaxis: coumadin Code Status: full Family Communication: plan of care discussed with patient in detail  Disposition Plan: Remains inpatient pending clinical improvement.   Consultants: Cardiology  Procedures: None  Microbiology: urine cx pending  Antimicrobials: Anti-infectives (From admission, onward)   Start     Dose/Rate Route Frequency Ordered Stop   12/27/18 1500  cefTRIAXone (ROCEPHIN) 1 g in sodium chloride 0.9 % 100 mL IVPB     1 g 200 mL/hr over 30 Minutes Intravenous Every 24 hours 12/26/18 1522     12/26/18 1445  cefTRIAXone (ROCEPHIN) 1 g in sodium chloride 0.9 % 100 mL IVPB     1 g 200 mL/hr over 30 Minutes Intravenous  Once 12/26/18 1435 12/26/18 1546       Objective: Vitals:   12/26/18 2035 12/27/18 0434 12/27/18 0736 12/27/18 1003  BP: (!) 112/50 134/71 (!) 140/52 (!) 123/57  Pulse: (!) 59 61 61 (!) 57  Resp: 17 16 17    Temp: 97.6 F (36.4 C) 97.8 F (36.6 C) 97.8 F (36.6 C)   TempSrc: Oral Oral Oral   SpO2: 97% 98% 96% 95%  Weight:  115 kg    Height:        Intake/Output Summary (Last 24 hours) at 12/27/2018 1039 Last data filed at 12/27/2018 0530 Gross per 24 hour  Intake 1005.64 ml  Output 400 ml  Net 605.64 ml   Filed Weights   12/26/18 1825 12/27/18 0434  Weight: 115.1 kg 115 kg   Weight change:   Body mass index is 46.38 kg/m.  Intake/Output from previous day: 09/20 0701 - 09/21 0700 In: 1005.6  [P.O.:906; IV Piggyback:99.6] Out: 400 [Urine:400] Intake/Output this shift: No intake/output data recorded.  Examination:  General exam: Appears calm and comfortable,obese, on RA  HEENT:PERRL,Oral mucosa moist, Ear/Nose normal on gross exam Respiratory system: Bilateral equal air entry, lung sounds diminished b/l. Cardiovascular system: S1 & S2 heard,No JVD, murmurs. Gastrointestinal system: Abdomen is  soft, non tender, non distended, BS +  Nervous System:Alert and oriented. No focal neurological deficits/moving extremities, sensation intact. Extremities: chronic hyperpigmentation/ edema on LE,  no clubbing, distal peripheral pulses palpable. Skin: No rashes, lesions, no icterus MSK: Normal muscle bulk,tone ,power  Medications:  Scheduled Meds: . citalopram  20 mg Oral QHS  . ferrous sulfate  325 mg Oral Daily  . furosemide  60 mg Intravenous BID  . hydrALAZINE  25 mg Oral TID  . insulin glargine  10 Units Subcutaneous Daily  . isosorbide mononitrate  120 mg Oral Daily  . levothyroxine  75 mcg Oral QAC breakfast  . Melatonin  9 mg Oral QHS  . rosuvastatin  10 mg Oral  QHS  . sodium chloride flush  3 mL Intravenous Q12H  . Warfarin - Pharmacist Dosing Inpatient   Does not apply q1800   Continuous Infusions: . sodium chloride    . cefTRIAXone (ROCEPHIN)  IV      Data Reviewed: I have personally reviewed following labs and imaging studies  CBC: Recent Labs  Lab 12/26/18 1212 12/27/18 0539  WBC 6.0 6.1  NEUTROABS 4.1 3.8  HGB 13.1 13.2  HCT 42.3 41.3  MCV 102.7* 101.2*  PLT 163 0000000   Basic Metabolic Panel: Recent Labs  Lab 12/26/18 1212 12/27/18 0539  NA 139 140  K 4.7 4.5  CL 99 97*  CO2 28 31  GLUCOSE 100* 93  BUN 51* 55*  CREATININE 2.23* 2.62*  CALCIUM 10.4* 9.7   GFR: Estimated Creatinine Clearance: 26.7 mL/min (A) (by C-G formula based on SCr of 2.62 mg/dL (H)). Liver Function Tests: No results for input(s): AST, ALT, ALKPHOS, BILITOT, PROT,  ALBUMIN in the last 168 hours. No results for input(s): LIPASE, AMYLASE in the last 168 hours. No results for input(s): AMMONIA in the last 168 hours. Coagulation Profile: Recent Labs  Lab 12/26/18 1212 12/27/18 0539  INR 2.5* 2.5*   Cardiac Enzymes: No results for input(s): CKTOTAL, CKMB, CKMBINDEX, TROPONINI in the last 168 hours. BNP (last 3 results) No results for input(s): PROBNP in the last 8760 hours. HbA1C: No results for input(s): HGBA1C in the last 72 hours. CBG: No results for input(s): GLUCAP in the last 168 hours. Lipid Profile: No results for input(s): CHOL, HDL, LDLCALC, TRIG, CHOLHDL, LDLDIRECT in the last 72 hours. Thyroid Function Tests: No results for input(s): TSH, T4TOTAL, FREET4, T3FREE, THYROIDAB in the last 72 hours. Anemia Panel: No results for input(s): VITAMINB12, FOLATE, FERRITIN, TIBC, IRON, RETICCTPCT in the last 72 hours. Sepsis Labs: No results for input(s): PROCALCITON, LATICACIDVEN in the last 168 hours.  Recent Results (from the past 240 hour(s))  SARS CORONAVIRUS 2 (TAT 6-24 HRS) Nasopharyngeal Nasopharyngeal Swab     Status: None   Collection Time: 12/26/18  2:00 PM   Specimen: Nasopharyngeal Swab  Result Value Ref Range Status   SARS Coronavirus 2 NEGATIVE NEGATIVE Final    Comment: (NOTE) SARS-CoV-2 target nucleic acids are NOT DETECTED. The SARS-CoV-2 RNA is generally detectable in upper and lower respiratory specimens during the acute phase of infection. Negative results do not preclude SARS-CoV-2 infection, do not rule out co-infections with other pathogens, and should not be used as the sole basis for treatment or other patient management decisions. Negative results must be combined with clinical observations, patient history, and epidemiological information. The expected result is Negative. Fact Sheet for Patients: SugarRoll.be Fact Sheet for Healthcare Providers:  https://www.woods-mathews.com/ This test is not yet approved or cleared by the [place] FDA and  has been authorized for detection and/or diagnosis of SARS-CoV-2 by FDA under an Emergency Use Authorization (EUA). This EUA will remain  in effect (meaning this test can be used) for the duration of the COVID-19 declaration under Section 56 4(b)(1) of the Act, 21 U.S.C. section 360bbb-3(b)(1), unless the authorization is terminated or revoked sooner. Performed at [facility], [place] [address], [place], [place] [address]       Radiology Studies: Dg Chest Port 1 View  Result Date: 12/26/2018 CLINICAL DATA:  Shortness of breath, chest pain with cough EXAM: PORTABLE CHEST 1 VIEW COMPARISON:  Chest x-rays dated 09/01/2018 and 05/04/2016. FINDINGS: Stable cardiomegaly. Mild central pulmonary vascular congestion and mild  bilateral interstitial thickening/edema. No pleural effusion or pneumothorax is seen. Osseous structures about the chest are unremarkable. LEFT chest wall pacemaker/ICD apparatus in place. IMPRESSION: 1. Stable cardiomegaly. 2. Mild central pulmonary vascular congestion and mild bilateral interstitial thickening/edema suggesting mild volume overload/CHF. Electronically Signed   By: Franki Cabot M.D.   On: 12/26/2018 12:00      LOS: 1 day   Time spent: More than 50% of that time was spent in counseling and/or coordination of care.  Antonieta Pert, MD [facility]  12/27/2018, 10:39 AM

## 2018-12-27 NOTE — Plan of Care (Signed)

## 2018-12-28 DIAGNOSIS — E869 Volume depletion, unspecified: Secondary | ICD-10-CM | POA: Diagnosis present

## 2018-12-28 DIAGNOSIS — E782 Mixed hyperlipidemia: Secondary | ICD-10-CM

## 2018-12-28 DIAGNOSIS — I441 Atrioventricular block, second degree: Secondary | ICD-10-CM

## 2018-12-28 DIAGNOSIS — I484 Atypical atrial flutter: Secondary | ICD-10-CM

## 2018-12-28 LAB — URINE CULTURE: Culture: >=100000 — AB

## 2018-12-28 LAB — PROTIME-INR
INR: 2.5 — ABNORMAL HIGH (ref 0.8–1.2)
Prothrombin Time: 26.9 seconds — ABNORMAL HIGH (ref 11.4–15.2)

## 2018-12-28 LAB — BASIC METABOLIC PANEL
Anion gap: 10 (ref 5–15)
BUN: 58 mg/dL — ABNORMAL HIGH (ref 8–23)
CO2: 29 mmol/L (ref 22–32)
Calcium: 8.9 mg/dL (ref 8.9–10.3)
Chloride: 99 mmol/L (ref 98–111)
Creatinine, Ser: 2.55 mg/dL — ABNORMAL HIGH (ref 0.44–1.00)
GFR calc Af Amer: 23 mL/min — ABNORMAL LOW (ref >60)
GFR calc non Af Amer: 19 mL/min — ABNORMAL LOW (ref >60)
Glucose, Bld: 102 mg/dL — ABNORMAL HIGH (ref 70–99)
Potassium: 4.3 mmol/L (ref 3.5–5.1)
Sodium: 138 mmol/L (ref 135–145)

## 2018-12-28 LAB — GLUCOSE, CAPILLARY
Glucose-Capillary: 141 mg/dL — ABNORMAL HIGH (ref 70–99)
Glucose-Capillary: 106 mg/dL — ABNORMAL HIGH (ref 70–99)
Glucose-Capillary: 118 mg/dL — ABNORMAL HIGH (ref 70–99)
Glucose-Capillary: 125 mg/dL — ABNORMAL HIGH (ref 70–99)

## 2018-12-28 MED ORDER — ISOSORBIDE MONONITRATE ER 60 MG PO TB24
60.0000 mg | ORAL_TABLET | Freq: Every day | ORAL | Status: DC
  Administered 2018-12-29 – 2018-12-31 (×3): 60 mg via ORAL
  Filled 2018-12-28 (×3): qty 1

## 2018-12-28 MED ORDER — WARFARIN SODIUM 7.5 MG PO TABS
7.5000 mg | ORAL_TABLET | Freq: Once | ORAL | Status: AC
  Administered 2018-12-28: 7.5 mg via ORAL
  Filled 2018-12-28: qty 1

## 2018-12-28 NOTE — Progress Notes (Signed)
Cardiology Consultation:   Patient ID: AADHYA BUSTAMANTE MRN: [id number]; DOB: 10/12/1955  Admit date: 12/26/2018 Date of Consult: 12/28/2018  Primary Care Provider: [name], MD Primary Cardiologist: Dorathy Stallone  Primary Electrophysiologist:  Allred    Patient Profile:   [name] is a 63 y.o. female with a hx of hypertension, hyperlipidemia, diabetes mellitus, congestive heart failure with preserved ejection fraction, atrial fibrillation, complete heart block, DVT, pulmonary embolus, hypothyroidism who is being seen today for the evaluation of worsening dry cough, shortness of breath and chest pain  At the request of Dr. Antonieta Pert.  History of Present Illness:   [name] is a 63 year old female who of met several months ago.    During the admission that I saw her she had an episode of syncope.  She was ultimately found to have heart block and a pacemaker was placed.  She also has a history of hypertension, morbid obesity and hyperlipidemia.  She was thought to have acute on chronic diastolic congestive heart failure on admission.  She has diuresed 738 cc so far during this admission.  Creatinine has increased from 2.2 to 2.6 since that time.   BUN has increased from 51 to 58.  Echocardiogram from September 21 reveals hyperdynamic left ventricular systolic function with an ejection fraction of 65 to 70%.  She has pseudonormalization consistent with grade 2 diastolic CHF.  The right ventricular size and function are normal.  She has developed acute on chronic kidney disease.  Her creatinine is up to 2.55. She has had fatigue for the past week or so. She was doing her laundry and gave out when carrying the laundry  She had acute DOE Wt is down 10 lbs from her last doctors visit  She watches her salt  No fever,  + cough , covid is negative  No pleuretic  cp to speak of , d-dimer was negative in the ER .  She has had pulmonary emboli in the past.  These symptoms this admission  are similar to previous episodes of PE but not as severe.  She has an IVC filter in place after a PE in 2013   Is better today after diuresis.   Has walked to the bathroom and back ,  Breathing seems a bit better.  She notes she is not urinating as much as she typically does.    Heart Pathway Score:     Past Medical History:  Diagnosis Date   A-fib ([place])    Anemia    Anxiety    Arthritis    "right hip; both knees; left wrist/shoulder; back" (01/19/2013"   Bleeding on Coumadin 08/2012; 01/18/2013   [facility] admissions (01/19/2013)   CHF (congestive heart failure) ([place])    "2-3 times" (01/19/2013)   Chronic lower back pain    Depression    DVT (deep venous thrombosis) ([facility]) 10 years ago   numerous/notes 01/18/2013   GERD (gastroesophageal reflux disease)    Gout    Headache(784.0)    "maybe weekly" (01/19/2013)   Heart murmur    High cholesterol    "been off RX for this at one time" (01/18/2013)   History of blood transfusion 1983; 04/2012   "3 w/ childbirth; hospitalized for pain" (01/19/2013)   Hypertension    Hypothyroidism    Migraines    "twice/yr maybe" (01/19/2013)   OSA (obstructive sleep apnea)    "sent me for test in 04/2012; never ordered mask, etc" (01/19/2013)   PE (pulmonary thromboembolism) ([place])  3 years ag0   3/notes 01/18/2013   Pneumonia before 2011   "once' (01/18/2013)   Renal disorder    kindey function low; "Metformin was destroying my kidneys" (01/19/2013)   Shortness of breath    "only related to my CHF" (01/18/2013)   Swelling of hand 08/31/2014   RT HAND   Type II diabetes mellitus ([place])    UTI (urinary tract infection) 08/31/2014    Past Surgical History:  Procedure Laterality Date   CARDIAC CATHETERIZATION N/A 03/13/2015   Procedure: Right/Left Heart Cath and Coronary Angiography;  Surgeon: Adrian Prows, MD;  Location: [facility];  Service: Cardiovascular;  Laterality: N/A;   CATARACT EXTRACTION W/ INTRAOCULAR  LENS  IMPLANT, BILATERAL Bilateral 2006-2011   CESAREAN SECTION  1983   CHOLECYSTECTOMY  ~ 2002   COLONOSCOPY N/A 01/21/2013   Procedure: COLONOSCOPY;  Surgeon: Beryle Beams, MD;  Location: [facility];  Service: Endoscopy;  Laterality: N/A;   EYE SURGERY Bilateral    "multiple" (01/18/2013)   PACEMAKER IMPLANT N/A 08/31/2018   Symptomatic bradycardia due to mobitz II second degree AV block, permanent afib/ atypical atrial flutter implanted by Dr Annye English PLANA REPAIR OF RETINAL DEATACHMENT Right    PARS PLANA VITRECTOMY Bilateral 2004-2006   "several" (01/18/2013)   REFRACTIVE SURGERY Bilateral    "for stigmatism" (01/18/2013)   REFRACTIVE SURGERY Left ~ 11/2012   "to puff it up cause vision got hazy" (01/18/2013)   [place]  2011?     Home Medications:  Prior to Admission medications   Medication Sig Start Date End Date Taking? Authorizing Provider  acetaminophen (TYLENOL) 500 MG tablet Take 500 mg by mouth every 6 (six) hours as needed for fever or headache (pain).   Yes [provider]  allopurinol (ZYLOPRIM) 300 MG tablet Take 300 mg by mouth daily.   Yes [provider]  Biotin 1000 MCG tablet Take 500 mcg by mouth daily with lunch.    Yes [provider]  Calcium Citrate-Vitamin D (CITRACAL/VITAMIN D PO) Take 2 tablets by mouth every evening.   Yes [provider]  citalopram (CELEXA) 20 MG tablet Take 20 mg by mouth at bedtime.   Yes [provider]  ferrous sulfate 325 (65 FE) MG tablet Take 325 mg by mouth daily.   Yes [provider]  hydrALAZINE (APRESOLINE) 25 MG tablet HOLD UNTIL SEEN BY YOUR PCP Morning, mid afternoon and bedtime Patient taking differently: Take 25 mg by mouth 3 (three) times daily.  09/03/18  Yes Oretha Milch D, MD  insulin aspart (NOVOLOG) 100 UNIT/ML injection Inject 2 Units into the skin 3 (three) times daily with meals. Patient taking differently: Inject 8  Units into the skin 3 (three) times daily as needed for high blood sugar (CBG >160).  09/04/14  Yes Velvet Bathe, MD  Insulin Detemir (LEVEMIR FLEXTOUCH) 100 UNIT/ML Pen Inject 20 Units into the skin daily before breakfast.    Yes [provider]  isosorbide mononitrate (IMDUR) 120 MG 24 hr tablet Take 120 mg by mouth daily.   Yes [provider]  levothyroxine (SYNTHROID) 75 MCG tablet Take 75 mcg by mouth daily before breakfast.    Yes [provider]  Melatonin 5 MG TABS Take 10 mg by mouth at bedtime.    Yes [provider]  Multiple Vitamin (MULITIVITAMIN WITH MINERALS) TABS Take 1 tablet by mouth daily.   Yes [provider]  Omega-3 Fatty Acids (FISH OIL)  1000 MG CAPS Take 1,000 mg by mouth daily.    Yes [provider]  rosuvastatin (CRESTOR) 10 MG tablet Take 1 tablet (10 mg total) by mouth daily. Patient taking differently: Take 10 mg by mouth at bedtime.  05/09/16  Yes Rogue Bussing, MD  Semaglutide,0.25 or 0.5MG/DOS, (OZEMPIC, 0.25 OR 0.5 MG/DOSE,) 2 MG/1.5ML SOPN Inject 0.5 mg into the skin once a week.   Yes [provider]  spironolactone (ALDACTONE) 25 MG tablet Take 25 mg by mouth daily. 11/03/18  Yes [provider]  torsemide (DEMADEX) 20 MG tablet Take 2 tablets (40 mg total) by mouth daily as needed. Patient taking differently: Take 40 mg by mouth daily as needed (fluid).  09/03/18  Yes Oretha Milch D, MD  warfarin (COUMADIN) 2.5 MG tablet TAKE 7.5 MG ON M-W-F AND 5 MG TUES, THURS, SAT AND SUN AS DIRECTED Patient taking differently: Take 5-7.5 mg by mouth See admin instructions. Take 2 tablets (5 mg) by mouth on Monday,Wednesday, Friday, Saturday and Sunday. Take 3 tablets (7.5 mg) on Tuesday and Thursday. 02/10/16  Yes Gordy Levan, MD    Inpatient Medications: Scheduled Meds:  citalopram  20 mg Oral QHS   ferrous sulfate  325 mg Oral Daily   furosemide  40 mg Intravenous Daily    hydrALAZINE  25 mg Oral TID   insulin glargine  10 Units Subcutaneous Daily   isosorbide mononitrate  120 mg Oral Daily   levothyroxine  75 mcg Oral QAC breakfast   Melatonin  9 mg Oral QHS   rosuvastatin  10 mg Oral QHS   sodium chloride flush  3 mL Intravenous Q12H   Warfarin - Pharmacist Dosing Inpatient   Does not apply q1800   Continuous Infusions:  sodium chloride     cefTRIAXone (ROCEPHIN)  IV 1 g (12/28/18 1454)   PRN Meds: sodium chloride, acetaminophen, HYDROcodone-acetaminophen, insulin aspart, ondansetron (ZOFRAN) IV, sodium chloride flush  Allergies:    Allergies  Allergen Reactions   Morphine And Related Rash    Social History:   Social History   Socioeconomic History   Marital status: Single    Spouse name: Not on file   Number of children: Not on file   Years of education: Not on file   Highest education level: Not on file  Occupational History   Occupation: disabled  Social Designer, fashion/clothing strain: Not on file   Food insecurity    Worry: Not on file    Inability: Not on file   Transportation needs    Medical: Not on file    Non-medical: Not on file  Tobacco Use   Smoking status: Former Smoker    Packs/day: 0.05    Years: 30.00    Pack years: 1.50    Types: Cigarettes    Quit date: 1990    Years since quitting: 30.7   Smokeless tobacco: Never Used   Tobacco comment: 01/19/2013 "quit smoking cigarettes in the early '90's"  Substance and Sexual Activity   Alcohol use: Not Currently    Comment: rare now but never a heavy drinker   Drug use: No   Sexual activity: Never  Lifestyle   Physical activity    Days per week: Not on file    Minutes per session: Not on file   Stress: Not on file  Relationships   Social connections    Talks on phone: Not on file    Gets together: Not on file  Attends religious service: Not on file    Active member of club or organization: Not on file    Attends meetings of  clubs or organizations: Not on file    Relationship status: Not on file   Intimate partner violence    Fear of current or ex partner: Not on file    Emotionally abused: Not on file    Physically abused: Not on file    Forced sexual activity: Not on file  Other Topics Concern   Not on file  Social History Narrative   Not on file    Family History:    Family History  Problem Relation Age of Onset   Cerebral aneurysm Mother    Hypertension Father    Cerebral aneurysm Maternal Grandfather    Cerebral aneurysm Maternal Aunt    Cancer Maternal Uncle      ROS:  Please see the history of present illness.   All other ROS reviewed and negative.     Physical Exam/Data:   Vitals:   12/27/18 1937 12/28/18 0400 12/28/18 0407 12/28/18 1140  BP: (!) 150/76  (!) 118/52 105/64  Pulse: 63  62 61  Resp: _0 Temp: 98 F (36.7 C)  97.8 F (36.6 C) (!) 97.5 F (36.4 C)  TempSrc: Oral  Oral Oral  SpO2: 98%  96% 99%  Weight:  115.9 kg    Height:        Intake/Output Summary (Last 24 hours) at 12/28/2018 1704 Last data filed at 12/28/2018 1639 Gross per 24 hour  Intake 976.36 ml  Output 1950 ml  Net -973.64 ml   Last 3 Weights 12/28/2018 12/27/2018 12/26/2018  Weight (lbs) 255 lb 9.6 oz 253 lb 9.6 oz 253 lb 12 oz  Weight (kg) 115.939 kg 115.032 kg 115.1 kg     Body mass index is 46.75 kg/m.  General:  Morbidly obese female,  NAD  HEENT: normal Lymph: no adenopathy Neck: no JVD Endocrine:  No thryomegaly Vascular: No carotid bruits; FA pulses 2+ bilaterally without bruits  Cardiac:  RR,  2/6 systolic murmur  Lungs:  clear to auscultation bilaterally, no wheezing, rhonchi or rales  Abd:  Morbidly obese, Ext:  + chronic stasis changes. , no edema , decreased skin turgur in hands  Musculoskeletal:   marked stasis discoloration in legs, no edema  Skin: warm and dry  Neuro:  CNs 2-12 intact, no focal abnormalities noted Psych:  Normal affect   EKG:  The EKG was  personally reviewed and demonstrates:   NSR at 63.  LBBB  Telemetry:  Telemetry was personally reviewed and demonstrates:   NSR   Relevant CV Studies:   Laboratory Data:  High Sensitivity Troponin:   Recent Labs  Lab 12/26/18 1212 12/26/18 1343  TROPONINIHS 54* 54*     Chemistry Recent Labs  Lab 12/26/18 1212 12/27/18 0539 12/28/18 0447  NA 139 140 138  K 4.7 4.5 4.3  CL 99 97* 99  CO2 _1 GLUCOSE 100* 93 102*  BUN 51* 55* 58*  CREATININE 2.23* 2.62* 2.55*  CALCIUM 10.4* 9.7 8.9  GFRNONAA 23* 19* 19*  GFRAA 27* 22* 23*  ANIONGAP _2 No results for input(s): PROT, ALBUMIN, AST, ALT, ALKPHOS, BILITOT in the last 168 hours. Hematology Recent Labs  Lab 12/26/18 1212 12/27/18 0539  WBC 6.0 6.1  RBC 4.12 4.08  HGB 13.1 13.2  HCT 42.3 41.3  MCV 102.7* 101.2[facility]  31.8 32.4  MCHC 31.0 32.0  RDW 15.6* 15.7*  PLT 163 162   BNP Recent Labs  Lab 12/26/18 1212  BNP 430.9*    DDimer  Recent Labs  Lab 12/26/18 1212  DDIMER 0.37     Radiology/Studies:  Dg Cervical Spine Complete  Result Date: 12/27/2018 CLINICAL DATA:  63 year old female with neck pain. EXAM: CERVICAL SPINE - COMPLETE 4+ VIEW COMPARISON:  Cervical spine radiograph dated 09/16/2018 FINDINGS: Evaluation is limited due to patient positioning and soft tissue attenuation. No definite acute fracture or subluxation identified. There is multilevel degenerative changes and osteophyte. The visualized odontoid appears intact. There is anatomic alignment the lateral masses of C1 and C2. The soft tissues are grossly unremarkable. IMPRESSION: 1. No definite acute fracture or subluxation. 2. Multilevel degenerative changes. Electronically Signed   By: Anner Crete M.D.   On: 12/27/2018 22:30   Dg Thoracic Spine W/swimmers  Result Date: 12/27/2018 CLINICAL DATA:  63 year old female with fall and back pain. EXAM: THORACIC SPINE - 3 VIEWS COMPARISON:  Chest radiograph dated 12/26/2018 and  09/01/2018 FINDINGS: Evaluation is limited due to superimposition of the soft tissues. No definite acute fracture or subluxation of the thoracic spine. Multilevel degenerative changes and osteophyte. Left pectoral pacemaker device. Soft tissues are grossly unremarkable. IMPRESSION: No definite acute fracture or subluxation of the thoracic spine. Electronically Signed   By: Anner Crete M.D.   On: 12/27/2018 23:30   Dg Ankle Complete Left  Result Date: 12/28/2018 CLINICAL DATA:  Left ankle pain after fall in bathroom. EXAM: LEFT ANKLE COMPLETE - 3+ VIEW COMPARISON:  None. FINDINGS: No acute fracture or dislocation. Ankle mortise is preserved. Mild mid and hindfoot osteoarthritis. Plantar calcaneal spur and Achilles tendon enthesophyte. Possible small ankle joint effusion. Bones appear under mineralized. Mild soft tissue edema. IMPRESSION: 1. No acute fracture or dislocation of the left ankle. 2. Mild mid and hindfoot osteoarthritis. Electronically Signed   By: Keith Rake M.D.   On: 12/28/2018 00:11   Dg Chest Port 1 View  Result Date: 12/26/2018 CLINICAL DATA:  Shortness of breath, chest pain with cough EXAM: PORTABLE CHEST 1 VIEW COMPARISON:  Chest x-rays dated 09/01/2018 and 05/04/2016. FINDINGS: Stable cardiomegaly. Mild central pulmonary vascular congestion and mild bilateral interstitial thickening/edema. No pleural effusion or pneumothorax is seen. Osseous structures about the chest are unremarkable. LEFT chest wall pacemaker/ICD apparatus in place. IMPRESSION: 1. Stable cardiomegaly. 2. Mild central pulmonary vascular congestion and mild bilateral interstitial thickening/edema suggesting mild volume overload/CHF. Electronically Signed   By: Franki Cabot M.D.   On: 12/26/2018 12:00    Assessment and Plan:   1. Fatigue:   Likely multifactorial.  She was admitted with what was thought to be acute on chronic diastolic dysfunction but since her admission her creatinine and BUN have both  increased after diuresis of only 700 cc of urine.  She notes that she is mainly been fatigued and then get short of breath after she does anything exertional.  She also has noticed that her urine output has greatly diminished.  She used to have to get up and go to the bathroom multiple times through the day but she made a point of telling me that she is hardly urinating at all.  She is been very compliant with her diet and has not been eating any salt.  She does not have any edema on exam.  She has good decreased skin turgor in her hands.  I think she may actually be volume depleted  and that her symptoms were due to marked dehydration.  She has had a history of moderate pulmonary hypertension in the past and I think it will be very difficult for [place] to assess her filling pressures.  I have scheduled her for right heart catheterization tomorrow.  I told her Coumadin and will put her on IV heparin per pharmacy protocol.  I will hold the next few doses of Lasix to see if she feels better and to see if her renal function improves.  2.  Chronic diastolic congestive heart failure: This is been associated with moderate pulmonary hypertension.  She had a right and left heart catheterization by Dr. Einar Gip in 2016.  She was found to have pulmonary artery pressures of 55 mmHg.   She was found to have high output failure likely due to her morbid obesity.  She had no significant coronary artery disease.  3.  Morbid obesity: She is been working hard on her weight loss program.  4.  History of pulmonary embolus: She is been on Coumadin.  She had a d-dimer on admission which was normal.  I do not think that her current symptoms are due to pulmonary emboli.    5.  Hyperlipidemia: Continue atorvastatin.  For questions or updates, please contact [facility] Please consult [email or web address] for contact info under     Signed, Mertie Moores, MD  12/28/2018 5:04 PM

## 2018-12-28 NOTE — Progress Notes (Addendum)
Cardiology Consultation:   Patient ID: [name] MRN: [id number]; DOB: [birth year]  Admit date: 12/26/2018 Date of Consult: 12/28/2018  Primary Care Provider: [name], MD Primary Cardiologist: Dr. Acie Fredrickson Primary Electrophysiologist:  Dr. Rayann Heman   Patient Profile:   [name] is a 63 y.o. female with a hx of HTN, HLD, b/l DVT's w/recurrent PE (x3) (has IVC filter) and chronic diastolic dysfunction, morbid obesity, and atypical AFlutter with SVR/advanced heart block w/PPM implant in May 2020 who is being seen today for the evaluation of pacer programming at the request of Dr. Lupita Leash .  History of Present Illness:   [name], [name] was admitted after seizure episode resulting in a fall and broken neck requiring weeks of hard neck collar support, [name] was evaluated by EP service for prolonged pauses on telemetry and bradycardia with rates to 30's.   [name] was initially planned for leadless pacer though given IVC filter unable and given felt to have permanent AFlutter with EKGs back to 2018 noting her flutter and underwent single chamber PPM implant on 08/31/2018, MDT device. [name] is due for her 3 mo post implant visit and reprogramming.  [name] was admitted to [facility] 12/26/18 with progressive cough, SOB and reports of CP, as well as urinary symptoms, reduced appetite. This AM markedly more fatigued and came in. Cardiology service has been consulted for diastolic CHF, CP with mildly elevated HS Trop (in the environment of AKI on CKD), [name] was given IV lasix, started on antibiotics for suspect UTI and was admitted by medicine service  [name] reports today feeling much better, no rest SOB, has not been up/about much but feels like [name] has better energy as well.  [name] mentions since her fall in May [name] has noted a fairly persistent dizziness, at times quite severe described as spinning, [name] does not feel like [name] is going to faint, but sometimes the spinning makes it hard for her to keep her  balance.  This can occur supine, seated or standing, and not particularly with change in position.  [name] had a fall (not faint) a couple weeks ago in the bathroom after a BM, but says was the spinning, [name] did not faint or feel like [name] was..  No syncope.  [name] is in SR on telemetry asynchronous V pacing  LABS K+ 4.3 BUN 4.3 Creat  2.33 > 2.62 . 2.55 (baseline looks about 1.6-1.8) WBC 6/1 H.H 13/41 Plts 162  HS Trop 54, 54 BNP 430  INR 2.5  Device information MDT single chamber PPM, implanted 08/31/18  Interrogation done today Battery and lead measurements are good No HVR or device observations The device had already reduced V lead outputs to chronic output of 2.5V/0.68ms     Heart Pathway Score:     Past Medical History:  Diagnosis Date  . A-fib ([place])   . Anemia   . Anxiety   . Arthritis    "right hip; both knees; left wrist/shoulder; back" (01/19/2013"  . Bleeding on Coumadin 08/2012; 01/18/2013   [facility] admissions (01/19/2013)  . CHF (congestive heart failure) ([place])    "2-3 times" (01/19/2013)  . Chronic lower back pain   . Depression   . DVT (deep venous thrombosis) ([place]) 10 years ago   numerous/notes 01/18/2013  . GERD (gastroesophageal reflux disease)   . Gout   . [id number])    "maybe weekly" (01/19/2013)  . Heart murmur   . High cholesterol    "been off RX for this at one  time" (01/18/2013)  . History of blood transfusion 1983; 04/2012   "3 w/ childbirth; hospitalized for pain" (01/19/2013)  . Hypertension   . Hypothyroidism   . Migraines    "twice/yr maybe" (01/19/2013)  . OSA (obstructive sleep apnea)    "sent me for test in 04/2012; never ordered mask, etc" (01/19/2013)  . PE (pulmonary thromboembolism) ([place]) 3 years ag0   3/notes 01/18/2013  . Pneumonia before 2011   "once' (01/18/2013)  . Renal disorder    kindey function low; "Metformin was destroying my kidneys" (01/19/2013)  . Shortness of breath    "only related to my CHF" (01/18/2013)   . Swelling of hand 08/31/2014   RT HAND  . Type II diabetes mellitus ([place])   . UTI (urinary tract infection) 08/31/2014    Past Surgical History:  Procedure Laterality Date  . CARDIAC CATHETERIZATION N/A 03/13/2015   Procedure: Right/Left Heart Cath and Coronary Angiography;  Surgeon: Adrian Prows, MD;  Location: [facility];  Service: Cardiovascular;  Laterality: N/A;  . CATARACT EXTRACTION W/ INTRAOCULAR LENS  IMPLANT, BILATERAL Bilateral 2006-2011  . CESAREAN SECTION  1983  . CHOLECYSTECTOMY  ~ 2002  . COLONOSCOPY N/A 01/21/2013   Procedure: COLONOSCOPY;  Surgeon: Beryle Beams, MD;  Location: [facility];  Service: Endoscopy;  Laterality: N/A;  . EYE SURGERY Bilateral    "multiple" (01/18/2013)  . PACEMAKER IMPLANT N/A 08/31/2018   Symptomatic bradycardia due to mobitz II second degree AV block, permanent afib/ atypical atrial flutter implanted by Dr Rayann Heman  . PARS PLANA REPAIR OF RETINAL DEATACHMENT Right   . PARS PLANA VITRECTOMY Bilateral 2004-2006   "several" (01/18/2013)  . REFRACTIVE SURGERY Bilateral    "for stigmatism" (01/18/2013)  . REFRACTIVE SURGERY Left ~ 11/2012   "to puff it up cause vision got hazy" (01/18/2013)  . VENA CAVA FILTER PLACEMENT  2011?     Home Medications:  Prior to Admission medications   Medication Sig Start Date End Date Taking? Authorizing Provider  acetaminophen (TYLENOL) 500 MG tablet Take 500 mg by mouth every 6 (six) hours as needed for fever or headache (pain).   Yes [provider]  allopurinol (ZYLOPRIM) 300 MG tablet Take 300 mg by mouth daily.   Yes [provider]  Biotin 1000 MCG tablet Take 500 mcg by mouth daily with lunch.    Yes [provider]  Calcium Citrate-Vitamin D (CITRACAL/VITAMIN D PO) Take 2 tablets by mouth every evening.   Yes [provider]  citalopram (CELEXA) 20 MG tablet Take 20 mg by mouth at bedtime.   Yes [provider]  ferrous sulfate 325 (65 FE) MG tablet  Take 325 mg by mouth daily.   Yes [provider]  hydrALAZINE (APRESOLINE) 25 MG tablet HOLD UNTIL SEEN BY YOUR PCP Morning, mid afternoon and bedtime Patient taking differently: Take 25 mg by mouth 3 (three) times daily.  09/03/18  Yes Oretha Milch D, MD  insulin aspart (NOVOLOG) 100 UNIT/ML injection Inject 2 Units into the skin 3 (three) times daily with meals. Patient taking differently: Inject 8 Units into the skin 3 (three) times daily as needed for high blood sugar (CBG >160).  09/04/14  Yes Velvet Bathe, MD  Insulin Detemir (LEVEMIR FLEXTOUCH) 100 UNIT/ML Pen Inject 20 Units into the skin daily before breakfast.    Yes [provider]  isosorbide mononitrate (IMDUR) 120 MG 24 hr tablet Take 120 mg by mouth daily.   Yes [provider]  levothyroxine (SYNTHROID)  75 MCG tablet Take 75 mcg by mouth daily before breakfast.    Yes [provider]  Melatonin 5 MG TABS Take 10 mg by mouth at bedtime.    Yes [provider]  Multiple Vitamin (MULITIVITAMIN WITH MINERALS) TABS Take 1 tablet by mouth daily.   Yes [provider]  Omega-3 Fatty Acids (FISH OIL) 1000 MG CAPS Take 1,000 mg by mouth daily.    Yes [provider]  rosuvastatin (CRESTOR) 10 MG tablet Take 1 tablet (10 mg total) by mouth daily. Patient taking differently: Take 10 mg by mouth at bedtime.  05/09/16  Yes Rogue Bussing, MD  Semaglutide,0.25 or 0.5MG /DOS, (OZEMPIC, 0.25 OR 0.5 MG/DOSE,) 2 MG/1.5ML SOPN Inject 0.5 mg into the skin once a week.   Yes [provider]  spironolactone (ALDACTONE) 25 MG tablet Take 25 mg by mouth daily. 11/03/18  Yes [provider]  torsemide (DEMADEX) 20 MG tablet Take 2 tablets (40 mg total) by mouth daily as needed. Patient taking differently: Take 40 mg by mouth daily as needed (fluid).  09/03/18  Yes Oretha Milch D, MD  warfarin (COUMADIN) 2.5 MG tablet TAKE 7.5 MG ON M-W-F AND 5 MG TUES, THURS, SAT AND  SUN AS DIRECTED Patient taking differently: Take 5-7.5 mg by mouth See admin instructions. Take 2 tablets (5 mg) by mouth on Monday,Wednesday, Friday, Saturday and Sunday. Take 3 tablets (7.5 mg) on Tuesday and Thursday. 02/10/16  Yes Gordy Levan, MD    Inpatient Medications: Scheduled Meds: . citalopram  20 mg Oral QHS  . ferrous sulfate  325 mg Oral Daily  . furosemide  40 mg Intravenous Daily  . hydrALAZINE  25 mg Oral TID  . insulin glargine  10 Units Subcutaneous Daily  . isosorbide mononitrate  120 mg Oral Daily  . levothyroxine  75 mcg Oral QAC breakfast  . Melatonin  9 mg Oral QHS  . rosuvastatin  10 mg Oral QHS  . sodium chloride flush  3 mL Intravenous Q12H  . warfarin  7.5 mg Oral ONCE-1800  . Warfarin - Pharmacist Dosing Inpatient   Does not apply q1800   Continuous Infusions: . sodium chloride    . cefTRIAXone (ROCEPHIN)  IV 1 g (12/27/18 1510)   PRN Meds: sodium chloride, acetaminophen, HYDROcodone-acetaminophen, insulin aspart, ondansetron (ZOFRAN) IV, sodium chloride flush  Allergies:    Allergies  Allergen Reactions  . Morphine And Related Rash    Social History:   Social History   Socioeconomic History  . Marital status: Single    Spouse name: Not on file  . Number of children: Not on file  . Years of education: Not on file  . Highest education level: Not on file  Occupational History  . Occupation: disabled  Social Needs  . Financial resource strain: Not on file  . Food insecurity    Worry: Not on file    Inability: Not on file  . Transportation needs    Medical: Not on file    Non-medical: Not on file  Tobacco Use  . Smoking status: Former Smoker    Packs/day: 0.05    Years: 30.00    Pack years: 1.50    Types: Cigarettes    Quit date: 1990    Years since quitting: 30.7  . Smokeless tobacco: Never Used  . Tobacco comment: 01/19/2013 "quit smoking cigarettes in the early '90's"  Substance and Sexual Activity  . Alcohol use: Not  Currently    Comment: rare  now but never a heavy drinker  . Drug use: No  . Sexual activity: Never  Lifestyle  . Physical activity    Days per week: Not on file    Minutes per session: Not on file  . Stress: Not on file  Relationships  . Social Herbalist on phone: Not on file    Gets together: Not on file    Attends religious service: Not on file    Active member of club or organization: Not on file    Attends meetings of clubs or organizations: Not on file    Relationship status: Not on file  . Intimate partner violence    Fear of current or ex partner: Not on file    Emotionally abused: Not on file    Physically abused: Not on file    Forced sexual activity: Not on file  Other Topics Concern  . Not on file  Social History Narrative  . Not on file    Family History:   Family History  Problem Relation Age of Onset  . Cerebral aneurysm Mother   . Hypertension Father   . Cerebral aneurysm Maternal Grandfather   . Cerebral aneurysm Maternal Aunt   . Cancer Maternal Uncle      ROS:  Please see the history of present illness.  All other ROS reviewed and negative.     Physical Exam/Data:   Vitals:   12/27/18 1937 12/28/18 0400 12/28/18 0407 12/28/18 1140  BP: (!) 150/76  (!) 118/52 105/64  Pulse: 63  62 61  Resp: 20  20 20   Temp: 98 F (36.7 C)  97.8 F (36.6 C) (!) 97.5 F (36.4 C)  TempSrc: Oral  Oral Oral  SpO2: 98%  96% 99%  Weight:  115.9 kg    Height:        Intake/Output Summary (Last 24 hours) at 12/28/2018 1224 Last data filed at 12/28/2018 [id number] Gross per 24 hour  Intake 840 ml  Output 1700 ml  Net -860 ml   Last 3 Weights 12/28/2018 12/27/2018 12/26/2018  Weight (lbs) 255 lb 9.6 oz 253 lb 9.6 oz 253 lb 12 oz  Weight (kg) 115.939 kg 115.032 kg 115.1 kg     Body mass index is 46.75 kg/m.  General:  Well nourished, well developed, in no acute distress HEENT: normal Lymph: no adenopathy Neck: no JVD Endocrine:  No thryomegaly Vascular:  No carotid bruits Cardiac:  RRR; 1-2/6 SM, no gallops or rubs Lungs:  CTA b/l, no wheezing, rhonchi or rales  Abd: soft, nontender, obese  Ext: chronic looking darkened skin changes b/l LE, ++ edema b/l Musculoskeletal:  No deformities Skin: warm and dry  Neuro:  No gross focal abnormalities noted Psych:  Normal affect   PPM site: located quite lateral and inferior, is well healed, no erythema, fluctuation, tethering or tenderness.  EKG:  The EKG was personally reviewed and demonstrates:   Baseline movement, V paced, likely sinus underneath Telemetry:  Telemetry was personally reviewed and demonstrates:   SR asynchronous V pacing  Relevant CV Studies:  12/27/2018: TTE IMPRESSIONS  1. Left ventricular ejection fraction, by visual estimation, is 65 to 70%. The left ventricle has normal function. Normal left ventricular size. There is moderately increased left ventricular hypertrophy.  2. Left ventricular diastolic Doppler parameters are consistent with pseudonormalization pattern of LV diastolic filling.  3. Global right ventricle has normal systolic function.The right ventricular size is normal. No increase in right ventricular wall thickness.  4. Left atrial size was mildly dilated.  5. Right atrial size was normal.  6. Mild to moderate aortic valve annular calcification.  7. Moderate to severe mitral annular calcification.  8. The mitral valve is normal in structure. No evidence of mitral valve regurgitation. No evidence of mitral stenosis.  9. The tricuspid valve is normal in structure. Tricuspid valve regurgitation was not visualized by color flow Doppler. 10. The aortic valve is normal in structure. Aortic valve regurgitation was not visualized by color flow Doppler. Mild to moderate aortic valve sclerosis/calcification without any evidence of aortic stenosis. 11. The pulmonic valve was normal in structure. Pulmonic valve regurgitation is not visualized by color flow Doppler. 12.  Normal pulmonary artery systolic pressure. 13. A pacer wire is visualized. 14. The inferior vena cava is normal in size with greater than 50% respiratory variability, suggesting right atrial pressure of 3 mmHg.    05/08/16 TTE Study Conclusions - Left ventricle: The cavity size was normal. Wall thickness was   increased in a pattern of severe LVH. Systolic function was   normal. The estimated ejection fraction was in the range of 50%   to 55%. Wall motion was normal; there were no regional wall   motion abnormalities. The study is not technically sufficient to   allow evaluation of LV diastolic function. - Aortic valve: There was trivial regurgitation. - Mitral valve: Calcified annulus. There was mild regurgitation. - Left atrium: The atrium was severely dilated. - Right ventricle: The cavity size was mildly dilated. - Right atrium: The atrium was mildly dilated. - Pericardium, extracardiac: A trivial pericardial effusion was   identified. Impressions: - Normal LV function; severe LVH; calcified aortic valve with trace   AI; mild MR; severe LAE; mild RAE and RVE; trace TR.    Laboratory Data:  High Sensitivity Troponin:   Recent Labs  Lab 12/26/18 1212 12/26/18 1343  TROPONINIHS 54* 54*     Chemistry Recent Labs  Lab 12/26/18 1212 12/27/18 0539 12/28/18 0447  NA 139 140 138  K 4.7 4.5 4.3  CL 99 97* 99  CO2 28 31 29   GLUCOSE 100* 93 102*  BUN 51* 55* 58*  CREATININE 2.23* 2.62* 2.55*  CALCIUM 10.4* 9.7 8.9  GFRNONAA 23* 19* 19*  GFRAA 27* 22* 23*  ANIONGAP 12 12 10     No results for input(s): PROT, ALBUMIN, AST, ALT, ALKPHOS, BILITOT in the last 168 hours. Hematology Recent Labs  Lab 12/26/18 1212 12/27/18 0539  WBC 6.0 6.1  RBC 4.12 4.08  HGB 13.1 13.2  HCT 42.3 41.3  MCV 102.7* 101.2*  MCH 31.8 32.4  MCHC 31.0 32.0  RDW 15.6* 15.7*  PLT 163 162   BNP Recent Labs  Lab 12/26/18 1212  BNP 430.9*    DDimer  Recent Labs  Lab 12/26/18 1212   DDIMER 0.37     Radiology/Studies:   Dg Cervical Spine Complete Result Date: 12/27/2018 CLINICAL DATA:  63 year old female with neck pain. EXAM: CERVICAL SPINE - COMPLETE 4+ VIEW COMPARISON:  Cervical spine radiograph dated 09/16/2018 FINDINGS: Evaluation is limited due to patient positioning and soft tissue attenuation. No definite acute fracture or subluxation identified. There is multilevel degenerative changes and osteophyte. The visualized odontoid appears intact. There is anatomic alignment the lateral masses of C1 and C2. The soft tissues are grossly unremarkable. IMPRESSION: 1. No definite acute fracture or subluxation. 2. Multilevel degenerative changes. Electronically Signed   By: Anner Crete M.D.   On: 12/27/2018 22:30  Dg Thoracic Spine W/swimmers Result Date: 12/27/2018 CLINICAL DATA:  63 year old female with fall and back pain. EXAM: THORACIC SPINE - 3 VIEWS COMPARISON:  Chest radiograph dated 12/26/2018 and 09/01/2018 FINDINGS: Evaluation is limited due to superimposition of the soft tissues. No definite acute fracture or subluxation of the thoracic spine. Multilevel degenerative changes and osteophyte. Left pectoral pacemaker device. Soft tissues are grossly unremarkable. IMPRESSION: No definite acute fracture or subluxation of the thoracic spine. Electronically Signed   By: Anner Crete M.D.   On: 12/27/2018 23:30   Dg Chest Port 1 View Result Date: 12/26/2018 CLINICAL DATA:  Shortness of breath, chest pain with cough EXAM: PORTABLE CHEST 1 VIEW COMPARISON:  Chest x-rays dated 09/01/2018 and 05/04/2016. FINDINGS: Stable cardiomegaly. Mild central pulmonary vascular congestion and mild bilateral interstitial thickening/edema. No pleural effusion or pneumothorax is seen. Osseous structures about the chest are unremarkable. LEFT chest wall pacemaker/ICD apparatus in place. IMPRESSION: 1. Stable cardiomegaly. 2. Mild central pulmonary vascular congestion and mild bilateral  interstitial thickening/edema suggesting mild volume overload/CHF. Electronically Signed   By: Franki Cabot M.D.   On: 12/26/2018 12:00    Assessment and Plan:   1. PPM for symptomatic second degree AV block     Functioning as programmed     [name] is in SR today, rates 60's and have had her PPM reprogrammed from VVIR 60 >> VVI 50.     Telemetry is now noted to be SR 60's      F/u EKG is ordered     Site is well healed  2. Atypical AFlutter     CHA2DS2Vasc is 3-4, on warfarin     Historically felt to be permanent, [name] is in SR today    Dr. Rayann Heman will see the patient later today    Cardiology service has been consulted for her c/o SOB, CHF, and CP (none of which are active or ongoing) Will defer to their management further care of the patient along with IM service.          For questions or updates, please contact [facility] [place] Please consult [email or web address] for contact info under     Signed, Baldwin Jamaica, PA-C  12/28/2018 12:24 PM   I have seen, examined the patient, and reviewed the above assessment and plan.  Changes to above are made where necessary.  On exam, RRR.  [name] pacemaker function as above.  [name] is in sinus.  Device reprogrammed.   .Electrophysiology team to see as needed while here. Please call with questions.   Co Sign: [name], MD 12/30/2018 7:34 AM

## 2018-12-28 NOTE — Progress Notes (Signed)
[place] for warfarin Indication: atrial fibrillation  Allergies  Allergen Reactions  . Morphine And Related Rash    Patient Measurements: Height: 5\' 2"  (157.5 cm) Weight: 255 lb 9.6 oz (115.9 kg) IBW/kg (Calculated) : 50.1  Vital Signs: Temp: 97.8 F (36.6 C) (09/22 0407) Temp Source: Oral (09/22 0407) BP: 118/52 (09/22 0407) Pulse Rate: 62 (09/22 0407)  Labs: Recent Labs    12/26/18 1212 12/26/18 1343 12/27/18 0539 12/28/18 0447  HGB 13.1  --  13.2  --   HCT 42.3  --  41.3  --   PLT 163  --  162  --   LABPROT 26.3*  --  26.9* 26.9*  INR 2.5*  --  2.5* 2.5*  CREATININE 2.23*  --  2.62* 2.55*  TROPONINIHS 54* 54*  --   --     Estimated Creatinine Clearance: 27.6 mL/min (A) (by C-G formula based on SCr of 2.55 mg/dL (H)).   Medical History: Past Medical History:  Diagnosis Date  . A-fib ([place])   . Anemia   . Anxiety   . Arthritis    "right hip; both knees; left wrist/shoulder; back" (01/19/2013"  . Bleeding on Coumadin 08/2012; 01/18/2013   [facility] admissions (01/19/2013)  . CHF (congestive heart failure) ([place])    "2-3 times" (01/19/2013)  . Chronic lower back pain   . Depression   . DVT (deep venous thrombosis) ([place]) 10 years ago   numerous/notes 01/18/2013  . GERD (gastroesophageal reflux disease)   . Gout   . [id number])    "maybe weekly" (01/19/2013)  . Heart murmur   . High cholesterol    "been off RX for this at one time" (01/18/2013)  . History of blood transfusion 1983; 04/2012   "3 w/ childbirth; hospitalized for pain" (01/19/2013)  . Hypertension   . Hypothyroidism   . Migraines    "twice/yr maybe" (01/19/2013)  . OSA (obstructive sleep apnea)    "sent me for test in 04/2012; never ordered mask, etc" (01/19/2013)  . PE (pulmonary thromboembolism) ([place]) 3 years ag0   3/notes 01/18/2013  . Pneumonia before 2011   "once' (01/18/2013)  . Renal disorder    kindey function low; "Metformin was  destroying my kidneys" (01/19/2013)  . Shortness of breath    "only related to my CHF" (01/18/2013)  . Swelling of hand 08/31/2014   RT HAND  . Type II diabetes mellitus ([place])   . UTI (urinary tract infection) 08/31/2014   Assessment: 6 yof presented to the ED with cough, shortness of breath and CP. She is on chronic warfarin for history of afib. INR is therapeutic at 2.5. CBC is WNL and no bleeding noted.   PTA dose: 5mg  daily except 7.5 mg on Tues + Thurs  Goal of Therapy:  INR 2-3 Monitor platelets by anticoagulation protocol: Yes   Plan:  Warfarin 7.5 mg PO x 1 tonight (consistent with home dose) Daily INR  Marguerite Olea, [facility] Clinical Pharmacist Phone [phone]  12/28/2018 9:36 AM

## 2018-12-28 NOTE — Progress Notes (Signed)
PROGRESS NOTE    [name]  [id number] DOB: Apr 03, 1956 DOA: 12/26/2018 PCP: [name], MD   Brief Narrative: As per H&P:  63 y.o. female with medical history HTN, HLD, DM, CHF last EF 50-55%, A. fib on Coumadin, complete heart block  s/p PM, history of DVT/PE, and hypothyroidism; who presents with complaints of progressively worsening dry cough, shortness of breath, and chest pain.  She reports having dry cough and shortness of breath progressively worsening over the last week.  Associated symptoms include chest heaviness, fatigue, rhinorrhea, poor appetite, nausea, change in taste, dysuria couple days ago, and dizziness.  She had recently went to a cookout on Labor Day weekend with a bunch of people where she and or the day where mask.  Denies having any significant fever, vomiting, leg swelling or diarrhea.  Of note patient did fall a week and a half ago and bruised her right side.  Patient required pacemaker placement back in May of this year by Dr. Rayann Heman.  She was recently scheduled an in office visit because she was told that her pacemaker needed reprogramming.  Today when she woke up she just did not feel right and reported that it took her a very long time to even put her clothes.  At some point she felt as though she could not catch her breath told her husband to call EMS. IN [id number] and hemodynamically stable.  Labs: BUN 51, creatinine 2.3, calcium 10.4, BNP 430.9, INR 2.5, and high-sensitivity troponin 54->54.  Urinalysis was positive for large leukocytes no bacteria seen and greater than 50 WBCs.  COVID-19 screening negative. Chest x-ray showing signs of mild volume overload.  Patient was given 40 mg of Lasix IV and 1 g of Rocephin for suspected urinary tract infection.  TR[facility] called to admit  Subjective: This morning, shortness of breath is much better however patient was dizzy and fell in the bathroom hurting her neck but no fracture and pain is better now when I saw her.  She reports she does get intermittent dizziness at home. She declined physical therapy evaluation. Assessment & Plan:   Acute on chronic diastolic congestive heart failure: Patient presenting with generalized weakness shortness of breath, chest x-ray showed signs of mild volume overload: on Lasix IV patient feeling much improved. Echocardiogram done-report pending.  BNP 430.9.  Last echo with EF of 50 to 50% in 2018.  Continue intake output monitoring, daily weight.  Cut back on Lasix given that creatinine is worsening. Normal wt around 245lb per her  in past few months.Reports her Weight at [facility] office on 9/8 was 268 and was 251 in ER.  Cardiology consulted for further recommendation.  Presyncopal episode overnioght 9/21 night fall in bathroom with tech helping her, patient had dizziness and fell hurting her nec, no LOC. no obvious fracture on the  x-rays.  Pain improved now.  Monitor orthostatic vitals, pacemaker interrogation, cardiology on consult. Check carotid duplex  Coli UTI POA with dysuria symptoms recently.  Continue on ceftriaxone, E. coli pansensitive   IDDM: Last hemoglobin A1c back in May was 6.3.  Blood sugar well controlled on continue Lantus and sliding scale insulin. Recent Labs  Lab 12/27/18 1105 12/27/18 1608 12/27/18 2100 12/28/18 0916 12/28/18 1137  GLUCAP 114* 113* 148* 141* 106*   Morbid obesity with + sleep apnea: Advised weight loss and lifestyle and outpatient follow-up.  Chest pain and elevated troponin: high-sensitivity troponin 54->54.  Patient complained of chest pain.  Monitoring telemetry this morning chest  pain-free alert awake oriented.  Cardiology consulted.  Anticoagulation coumadini, Crestor.  Echocardiogram fairly stable.  Acute kidney injury in the setting of CKD stage III, baseline creatinine around 1.6 on admission 2.2 , trending up to 2.6 in the setting of IV Lasix use. We have cut  Lasix to 40 daily-creat at 2.5, monitor  Recent Labs  Lab  12/26/18 1212 12/27/18 0539 12/28/18 0447  BUN 51* 55* 58*  CREATININE 2.23* 2.62* 2.55*   Atypical flutter/S/P pacemaker placement-patient had recent admission in May for symptomatic bradycardia secondary AV block requiring pacemaker placement interrogation done today- "Battery and lead measurements are goodNo HVR or device observations. The device had already reduced V lead outputs to chronic output of 2.5V/0.73ms".  She is in sinus rhythm, she is on anticoagulant with Coumadin.  COPD: stable, cont  albuterol  Anxiety/depression;stable, continue Celexa  Essential hypertension: BP controlled cont hydralazine and isosorbide mononitrate  Hypothyroidism: Last TSH 1.773 on 08/29/2018.Continue levothyroxine  Hyperlipidemia-cont crestor  History of recurrent PE/DVT, A flutter atypical. PAF, on chronic anticoagulation: INR therapeutic at 2.5 on admission.  Coumadin- pharmacy dosing.   DVT prophylaxis: coumadin    Code Status: Full Family Communication: No family present at bedside    Body mass index is 46.75 kg/m.    DVT prophylaxis: coumadin Code Status: full Family Communication: plan of care discussed with patient in detail  Disposition Plan: Remains inpatient pending further cardiology evaluation, monitoring overnight given episode of presyncope causing fall in the bathroom.  Patient declines PT. Anticipated disposition home in next 24 hours with home health RN for CHF  Consultants: Cardiology  Procedures: None   TTE 1. Left ventricular ejection fraction, by visual estimation, is 65 to 70%. The left ventricle has normal function. Normal left ventricular size. There is moderately increased left ventricular hypertrophy.  2. Left ventricular diastolic Doppler parameters are consistent with pseudonormalization pattern of LV diastolic filling.  3. Global right ventricle has normal systolic function.The right ventricular size is normal. No increase in right ventricular wall  thickness.  4. Left atrial size was mildly dilated.  5. Right atrial size was normal.  6. Mild to moderate aortic valve annular calcification.  7. Moderate to severe mitral annular calcification.  8. The mitral valve is normal in structure. No evidence of mitral valve regurgitation. No evidence of mitral stenosis.  9. The tricuspid valve is normal in structure. Tricuspid valve regurgitation was not visualized by color flow Doppler. 10. The aortic valve is normal in structure. Aortic valve regurgitation was not visualized by color flow Doppler. Mild to moderate aortic valve sclerosis/calcification without any evidence of aortic stenosis. 11. The pulmonic valve was normal in structure. Pulmonic valve regurgitation is not visualized by color flow Doppler. 12. Normal pulmonary artery systolic pressure. 13. A pacer wire is visualized. 14. The inferior vena cava is normal in size with greater than 50% respiratory variability, suggesting right atrial pressure of 3 mmHg.  Microbiology: urine cx pending  Antimicrobials: Anti-infectives (From admission, onward)   Start     Dose/Rate Route Frequency Ordered Stop   12/27/18 1500  cefTRIAXone (ROCEPHIN) 1 g in sodium chloride 0.9 % 100 mL IVPB     1 g 200 mL/hr over 30 Minutes Intravenous Every 24 hours 12/26/18 1522     12/26/18 1445  cefTRIAXone (ROCEPHIN) 1 g in sodium chloride 0.9 % 100 mL IVPB     1 g 200 mL/hr over 30 Minutes Intravenous  Once 12/26/18 1435 12/26/18 1546  Objective: Vitals:   12/27/18 1937 12/28/18 0400 12/28/18 0407 12/28/18 1140  BP: (!) 150/76  (!) 118/52 105/64  Pulse: 63  62 61  Resp: 20  20 20   Temp: 98 F (36.7 C)  97.8 F (36.6 C) (!) 97.5 F (36.4 C)  TempSrc: Oral  Oral Oral  SpO2: 98%  96% 99%  Weight:  115.9 kg    Height:        Intake/Output Summary (Last 24 hours) at 12/28/2018 1616 Last data filed at 12/28/2018 1507 Gross per 24 hour  Intake 976.36 ml  Output 1250 ml  Net -273.64 ml    Filed Weights   12/26/18 1825 12/27/18 0434 12/28/18 0400  Weight: 115.1 kg 115 kg 115.9 kg   Weight change: 0.839 kg  Body mass index is 46.75 kg/m.  Intake/Output from previous day: 09/21 0701 - 09/22 0700 In: 840 [P.O.:840] Out: 1450 [Urine:1450] Intake/Output this shift: Total I/O In: 616.4 [P.O.:480; IV Piggyback:136.4] Out: 550 [Urine:550]  Examination:  General exam: Appears calm and comfortable,obese, on RA  HEENT:PERRL,Oral mucosa moist, Ear/Nose normal on gross exam Respiratory system: Bilateral equal air entry, lung sounds diminished b/l. Cardiovascular system: S1 & S2 heard,No JVD, murmurs. Gastrointestinal system: Abdomen is  soft, non tender, non distended, BS +  Nervous System:Alert and oriented. No focal neurological deficits/moving extremities, sensation intact. Extremities: chronic hyperpigmentation/ edema on LE,  no clubbing, distal peripheral pulses palpable. Skin: No rashes, lesions, no icterus MSK: Normal muscle bulk,tone ,power  Medications:  Scheduled Meds: . citalopram  20 mg Oral QHS  . ferrous sulfate  325 mg Oral Daily  . furosemide  40 mg Intravenous Daily  . hydrALAZINE  25 mg Oral TID  . insulin glargine  10 Units Subcutaneous Daily  . isosorbide mononitrate  120 mg Oral Daily  . levothyroxine  75 mcg Oral QAC breakfast  . Melatonin  9 mg Oral QHS  . rosuvastatin  10 mg Oral QHS  . sodium chloride flush  3 mL Intravenous Q12H  . warfarin  7.5 mg Oral ONCE-1800  . Warfarin - Pharmacist Dosing Inpatient   Does not apply q1800   Continuous Infusions: . sodium chloride    . cefTRIAXone (ROCEPHIN)  IV 1 g (12/28/18 1454)    Data Reviewed: I have personally reviewed following labs and imaging studies  CBC: Recent Labs  Lab 12/26/18 1212 12/27/18 0539  WBC 6.0 6.1  NEUTROABS 4.1 3.8  HGB 13.1 13.2  HCT 42.3 41.3  MCV 102.7* 101.2*  PLT 163 0000000   Basic Metabolic Panel: Recent Labs  Lab 12/26/18 1212 12/27/18 0539 12/28/18  0447  NA 139 140 138  K 4.7 4.5 4.3  CL 99 97* 99  CO2 28 31 29   GLUCOSE 100* 93 102*  BUN 51* 55* 58*  CREATININE 2.23* 2.62* 2.55*  CALCIUM 10.4* 9.7 8.9   GFR: Estimated Creatinine Clearance: 27.6 mL/min (A) (by C-G formula based on SCr of 2.55 mg/dL (H)). Liver Function Tests: No results for input(s): AST, ALT, ALKPHOS, BILITOT, PROT, ALBUMIN in the last 168 hours. No results for input(s): LIPASE, AMYLASE in the last 168 hours. No results for input(s): AMMONIA in the last 168 hours. Coagulation Profile: Recent Labs  Lab 12/26/18 1212 12/27/18 0539 12/28/18 0447  INR 2.5* 2.5* 2.5*   Cardiac Enzymes: No results for input(s): CKTOTAL, CKMB, CKMBINDEX, TROPONINI in the last 168 hours. BNP (last 3 results) No results for input(s): PROBNP in the last 8760 hours. HbA1C: No results for input(s):  HGBA1C in the last 72 hours. CBG: Recent Labs  Lab 12/27/18 1105 12/27/18 1608 12/27/18 2100 12/28/18 0916 12/28/18 1137  GLUCAP 114* 113* 148* 141* 106*   Lipid Profile: No results for input(s): CHOL, HDL, LDLCALC, TRIG, CHOLHDL, LDLDIRECT in the last 72 hours. Thyroid Function Tests: No results for input(s): TSH, T4TOTAL, FREET4, T3FREE, THYROIDAB in the last 72 hours. Anemia Panel: No results for input(s): VITAMINB12, FOLATE, FERRITIN, TIBC, IRON, RETICCTPCT in the last 72 hours. Sepsis Labs: No results for input(s): PROCALCITON, LATICACIDVEN in the last 168 hours.  Recent Results (from the past 240 hour(s))  Urine culture     Status: Abnormal   Collection Time: 12/26/18  1:43 PM   Specimen: Urine, Random  Result Value Ref Range Status   Specimen Description URINE, RANDOM  Final   Special Requests   Final    NONE Performed at [facility], [address] [place], [place] [address]    Culture >=100,000 COLONIES/mL ESCHERICHIA COLI (A)  Final   Report Status 12/28/2018 FINAL  Final   Organism ID, Bacteria ESCHERICHIA COLI (A)  Final      Susceptibility    Escherichia coli - MIC*    AMPICILLIN <=2 SENSITIVE Sensitive     CEFAZOLIN <=4 SENSITIVE Sensitive     CEFTRIAXONE <=1 SENSITIVE Sensitive     CIPROFLOXACIN <=0.25 SENSITIVE Sensitive     GENTAMICIN <=1 SENSITIVE Sensitive     IMIPENEM <=0.25 SENSITIVE Sensitive     NITROFURANTOIN <=16 SENSITIVE Sensitive     TRIMETH/SULFA <=20 SENSITIVE Sensitive     AMPICILLIN/SULBACTAM <=2 SENSITIVE Sensitive     PIP/TAZO <=4 SENSITIVE Sensitive     Extended ESBL NEGATIVE Sensitive     * >=100,000 COLONIES/mL ESCHERICHIA COLI  SARS CORONAVIRUS 2 (TAT 6-24 HRS) Nasopharyngeal Nasopharyngeal Swab     Status: None   Collection Time: 12/26/18  2:00 PM   Specimen: Nasopharyngeal Swab  Result Value Ref Range Status   SARS Coronavirus 2 NEGATIVE NEGATIVE Final    Comment: (NOTE) SARS-CoV-2 target nucleic acids are NOT DETECTED. The SARS-CoV-2 RNA is generally detectable in upper and lower respiratory specimens during the acute phase of infection. Negative results do not preclude SARS-CoV-2 infection, do not rule out co-infections with other pathogens, and should not be used as the sole basis for treatment or other patient management decisions. Negative results must be combined with clinical observations, patient history, and epidemiological information. The expected result is Negative. Fact Sheet for Patients: SugarRoll.be Fact Sheet for Healthcare Providers: https://www.woods-mathews.com/ This test is not yet approved or cleared by the [place] FDA and  has been authorized for detection and/or diagnosis of SARS-CoV-2 by FDA under an Emergency Use Authorization (EUA). This EUA will remain  in effect (meaning this test can be used) for the duration of the COVID-19 declaration under Section 56 4(b)(1) of the Act, 21 U.S.C. section 360bbb-3(b)(1), unless the authorization is terminated or revoked sooner. Performed at [facility], [place] [address] [place],  [address]       Radiology Studies: Dg Cervical Spine Complete  Result Date: 12/27/2018 CLINICAL DATA:  63 year old female with neck pain. EXAM: CERVICAL SPINE - COMPLETE 4+ VIEW COMPARISON:  Cervical spine radiograph dated 09/16/2018 FINDINGS: Evaluation is limited due to patient positioning and soft tissue attenuation. No definite acute fracture or subluxation identified. There is multilevel degenerative changes and osteophyte. The visualized odontoid appears intact. There is anatomic alignment the lateral masses of C1 and C2. The soft  tissues are grossly unremarkable. IMPRESSION: 1. No definite acute fracture or subluxation. 2. Multilevel degenerative changes. Electronically Signed   By: Anner Crete M.D.   On: 12/27/2018 22:30   Dg Thoracic Spine W/swimmers  Result Date: 12/27/2018 CLINICAL DATA:  63 year old female with fall and back pain. EXAM: THORACIC SPINE - 3 VIEWS COMPARISON:  Chest radiograph dated 12/26/2018 and 09/01/2018 FINDINGS: Evaluation is limited due to superimposition of the soft tissues. No definite acute fracture or subluxation of the thoracic spine. Multilevel degenerative changes and osteophyte. Left pectoral pacemaker device. Soft tissues are grossly unremarkable. IMPRESSION: No definite acute fracture or subluxation of the thoracic spine. Electronically Signed   By: Anner Crete M.D.   On: 12/27/2018 23:30   Dg Ankle Complete Left  Result Date: 12/28/2018 CLINICAL DATA:  Left ankle pain after fall in bathroom. EXAM: LEFT ANKLE COMPLETE - 3+ VIEW COMPARISON:  None. FINDINGS: No acute fracture or dislocation. Ankle mortise is preserved. Mild mid and hindfoot osteoarthritis. Plantar calcaneal spur and Achilles tendon enthesophyte. Possible small ankle joint effusion. Bones appear under mineralized. Mild soft tissue edema. IMPRESSION: 1. No acute fracture or dislocation of the left ankle. 2. Mild mid and hindfoot osteoarthritis. Electronically Signed    By: Keith Rake M.D.   On: 12/28/2018 00:11      LOS: 2 days   Time spent: More than 50% of that time was spent in counseling and/or coordination of care.  Antonieta Pert, MD [facility]  12/28/2018, 4:16 PM

## 2018-12-29 ENCOUNTER — Encounter (HOSPITAL_COMMUNITY): Payer: Medicare HMO

## 2018-12-29 ENCOUNTER — Inpatient Hospital Stay (HOSPITAL_COMMUNITY): Payer: Medicare HMO

## 2018-12-29 DIAGNOSIS — R55 Syncope and collapse: Secondary | ICD-10-CM

## 2018-12-29 DIAGNOSIS — I5033 Acute on chronic diastolic (congestive) heart failure: Secondary | ICD-10-CM

## 2018-12-29 LAB — BASIC METABOLIC PANEL
Anion gap: 7 (ref 5–15)
BUN: 58 mg/dL — ABNORMAL HIGH (ref 8–23)
CO2: 30 mmol/L (ref 22–32)
Calcium: 8.8 mg/dL — ABNORMAL LOW (ref 8.9–10.3)
Chloride: 99 mmol/L (ref 98–111)
Creatinine, Ser: 2.34 mg/dL — ABNORMAL HIGH (ref 0.44–1.00)
GFR calc Af Amer: 25 mL/min — ABNORMAL LOW (ref >60)
GFR calc non Af Amer: 22 mL/min — ABNORMAL LOW (ref >60)
Glucose, Bld: 96 mg/dL (ref 70–99)
Potassium: 3.7 mmol/L (ref 3.5–5.1)
Sodium: 136 mmol/L (ref 135–145)

## 2018-12-29 LAB — GLUCOSE, CAPILLARY
Glucose-Capillary: 85 mg/dL (ref 70–99)
Glucose-Capillary: 127 mg/dL — ABNORMAL HIGH (ref 70–99)
Glucose-Capillary: 114 mg/dL — ABNORMAL HIGH (ref 70–99)
Glucose-Capillary: 117 mg/dL — ABNORMAL HIGH (ref 70–99)

## 2018-12-29 LAB — PROTIME-INR
INR: 2.5 — ABNORMAL HIGH (ref 0.8–1.2)
Prothrombin Time: 26.7 seconds — ABNORMAL HIGH (ref 11.4–15.2)

## 2018-12-29 MED ORDER — WARFARIN - PHARMACIST DOSING INPATIENT: Freq: Every day | Status: DC

## 2018-12-29 NOTE — Progress Notes (Signed)
Bed alarm placed again at shift change. Patient aware to call for assistance prior to getting up on her own.

## 2018-12-29 NOTE — Plan of Care (Signed)
  Problem: Education: Goal: Knowledge of General Education information will improve Description Including pain rating scale, medication(s)/side effects and non-pharmacologic comfort measures Outcome: Progressing   

## 2018-12-29 NOTE — Progress Notes (Signed)
This nurse spoke to [employer]. Patient currently has no IV medications that need to be given or need for procedures at this time. D/t vein preservation PIV will not be started at this time. Nurse made aware. Patient however is scheduled for procedure on Friday. Instructed nurse to consult when PIV needed. VU. Fran Lowes, RN VAST

## 2018-12-29 NOTE — Progress Notes (Signed)
Patient refusing bed alarm. This nurse informed the patient about the importance of calling for assistance to prevent falls.

## 2018-12-29 NOTE — Consult Note (Addendum)
Advanced Heart Failure Team Consult Note   Primary Physician: [name], MD PCP-Cardiologist:  Dr Acie Fredrickson  Reason for Consultation: Heart Failure   HPI:    [name] is seen today for evaluation of heart failure at the request of Dr Acie Fredrickson.   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 In 2013 she had sleep study with mild sleep apnea noted.   Most recent heart cath was in 2016 and showed no significant coronary diseased, cardiac output 8.9 and PA pressured 41/13.    In January 2020 she had presyncopal episode and had some intermittent dizziness.  She told her PCP and was instructed to keep a log. In May she had presyncope and a syncopal episode. She had a fall off her porch. This was determined to be in the setting of CHB. MDT single chamber PPM placed. She also had minimally displaced C6 fracture.  Functional limitation due knee pain. She uses a walker. She does not use oxygen. Weight at home has been trending down.   On Sunday she had extreme fatigue and presyncope. Her husband called 75. In the ED she had  increased SOB, fatigue and chest pain. CXR concerning for vascular congestion. Pertinent admission labs included creatinine 2.3, BNP 430, IN 2.5 HS Trop 54>54 and COVID 19 negative.  UTI note so rocephin was started. Cardiology consult and started IV lasix. Weight has gone up 4 pounds.   EP saw 9/22 reprogrammed to VVI 50 due to asynchronous V pacing. Today she is feeling better. Denies SOB.   Echo 12/27/18   1. Left ventricular ejection fraction, by visual estimation, is 65 to 70%. The left ventricle has normal function. Normal left ventricular size. There is moderately increased left ventricular hypertrophy.  2. Left ventricular diastolic Doppler parameters are consistent with pseudonormalization pattern of LV diastolic filling.  3. Global right ventricle has  normal systolic function.The right ventricular size is normal. No increase in right ventricular wall thickness.  4. Left atrial size was mildly dilated.  5. Right atrial size was normal.  6. Mild to moderate aortic valve annular calcification.  7. Moderate to severe mitral annular calcification.  8. The mitral valve is normal in structure. No evidence of mitral valve regurgitation. No evidence of mitral stenosis.  9. The tricuspid valve is normal in structure. Tricuspid valve regurgitation was not visualized by color flow Doppler. 10. The aortic valve is normal in structure. Aortic valve regurgitation was not visualized by color flow Doppler. Mild to moderate aortic valve sclerosis/calcification without any evidence of aortic stenosis. 11. The pulmonic valve was normal in structure. Pulmonic valve regurgitation is not visualized by color flow Doppler. 12. Normal pulmonary artery systolic pressure. 13. A pacer wire is visualized. 14. The inferior vena cava is normal in size with greater than 50% respiratory variability, suggesting right atrial pressure of 3 mmHg. Review of Systems: [y] = yes, _0  = no   . General: Weight gain _1 ; Weight loss _2 ; Anorexia _3 ; Fatigue [Y ]; Fever _4 ; Chills _5 ; Weakness [ Y]  . Cardiac: Chest pain/pressure _6 ; Resting SOB _7 ; Exertional SOB [Y ]; Orthopnea _8 ; Pedal Edema _9 ; Palpitations _10 ; Syncope _11 ; Presyncope [Y ]; Paroxysmal nocturnal dyspnea_12   . Pulmonary: Cough _13 ; Wheezing_14 ; Hemoptysis_15 ; Sputum _16 ; Snoring _17   .  GI: Vomiting_0 ; Dysphagia_1 ; Melena_2 ; Hematochezia _3 ; Heartburn_4 ; Abdominal pain _5 ; Constipation _6 ; Diarrhea _7 ; BRBPR _8   . GU: Hematuria_9 ; Dysuria _10 ; Nocturia_11   . Vascular: Pain in legs with walking [ Y]; Pain in feet with lying flat _12 ; Non-healing sores _13 ; Stroke _14 ; TIA _15 ; Slurred speech _16 ;  . Neuro: Headaches_17 ; Vertigo[Y ]; Seizures[Y ]; Paresthesias_18 ;Blurred vision _19 ; Diplopia _20 ; Vision  changes _21   . Ortho/Skin: Arthritis _22 ; Joint pain _23 ; Muscle pain _24 ; Joint swelling _25 ; Back Pain _26 ; Rash _27   . Psych: Depression_28 ; Anxiety_29   . Heme: Bleeding problems _30 ; Clotting disorders _31 ; Anemia _32   . Endocrine: Diabetes [ Y]; Thyroid dysfunction[ Y]  Home Medications Prior to Admission medications   Medication Sig Start Date End Date Taking? Authorizing Provider  acetaminophen (TYLENOL) 500 MG tablet Take 500 mg by mouth every 6 (six) hours as needed for fever or headache (pain).   Yes [provider]  allopurinol (ZYLOPRIM) 300 MG tablet Take 300 mg by mouth daily.   Yes [provider]  Biotin 1000 MCG tablet Take 500 mcg by mouth daily with lunch.    Yes [provider]  Calcium Citrate-Vitamin D (CITRACAL/VITAMIN D PO) Take 2 tablets by mouth every evening.   Yes [provider]  citalopram (CELEXA) 20 MG tablet Take 20 mg by mouth at bedtime.   Yes [provider]  ferrous sulfate 325 (65 FE) MG tablet Take 325 mg by mouth daily.   Yes [provider]  hydrALAZINE (APRESOLINE) 25 MG tablet HOLD UNTIL SEEN BY YOUR PCP Morning, mid afternoon and bedtime Patient taking differently: Take 25 mg by mouth 3 (three) times daily.  09/03/18  Yes Oretha Milch D, MD  insulin aspart (NOVOLOG) 100 UNIT/ML injection Inject 2 Units into the skin 3 (three) times daily with meals. Patient taking differently: Inject 8 Units into the skin 3 (three) times daily as needed for high blood sugar (CBG >160).  09/04/14  Yes Velvet Bathe, MD  Insulin Detemir (LEVEMIR FLEXTOUCH) 100 UNIT/ML Pen Inject 20 Units into the skin daily before breakfast.    Yes [provider]  isosorbide mononitrate (IMDUR) 120 MG 24 hr tablet Take 120 mg by mouth daily.   Yes [provider]  levothyroxine (SYNTHROID) 75 MCG tablet Take 75 mcg by mouth daily before breakfast.    Yes [provider]  Melatonin 5 MG TABS Take 10 mg by  mouth at bedtime.    Yes [provider]  Multiple Vitamin (MULITIVITAMIN WITH MINERALS) TABS Take 1 tablet by mouth daily.   Yes [provider]  Omega-3 Fatty Acids (FISH OIL) 1000 MG CAPS Take 1,000 mg by mouth daily.    Yes [provider]  rosuvastatin (CRESTOR) 10 MG tablet Take 1 tablet (10 mg total) by mouth daily. Patient taking differently: Take 10 mg by mouth at bedtime.  05/09/16  Yes Rogue Bussing, MD  Semaglutide,0.25 or 0.5MG/DOS, (OZEMPIC, 0.25 OR 0.5 MG/DOSE,) 2 MG/1.5ML SOPN Inject 0.5 mg into the skin once a week.   Yes [provider]  spironolactone (ALDACTONE) 25 MG tablet Take 25 mg by mouth daily. 11/03/18  Yes [provider]  torsemide (DEMADEX) 20 MG tablet Take 2 tablets (40 mg total) by mouth daily as needed. Patient taking differently: Take 40 mg by  mouth daily as needed (fluid).  09/03/18  Yes Oretha Milch D, MD  warfarin (COUMADIN) 2.5 MG tablet TAKE 7.5 MG ON M-W-F AND 5 MG TUES, THURS, SAT AND SUN AS DIRECTED Patient taking differently: Take 5-7.5 mg by mouth See admin instructions. Take 2 tablets (5 mg) by mouth on Monday,Wednesday, Friday, Saturday and Sunday. Take 3 tablets (7.5 mg) on Tuesday and Thursday. 02/10/16  Yes Gordy Levan, MD    Past Medical History: Past Medical History:  Diagnosis Date  . A-fib ([place])   . Anemia   . Anxiety   . Arthritis    "right hip; both knees; left wrist/shoulder; back" (01/19/2013"  . Bleeding on Coumadin 08/2012; 01/18/2013   BRBPR admissions (01/19/2013)  . CHF (congestive heart failure) ([place])    "2-3 times" (01/19/2013)  . Chronic lower back pain   . Depression   . DVT (deep venous thrombosis) ([place]) 10 years ago   numerous/notes 01/18/2013  . GERD (gastroesophageal reflux disease)   . Gout   . UEAVWUJW(119.1)    "maybe weekly" (01/19/2013)  . Heart murmur   . High cholesterol    "been off RX for this at one time" (01/18/2013)  . History of blood  transfusion 1983; 04/2012   "3 w/ childbirth; hospitalized for pain" (01/19/2013)  . Hypertension   . Hypothyroidism   . Migraines    "twice/yr maybe" (01/19/2013)  . OSA (obstructive sleep apnea)    "sent me for test in 04/2012; never ordered mask, etc" (01/19/2013)  . PE (pulmonary thromboembolism) ([place]) 3 years ag0   3/notes 01/18/2013  . Pneumonia before 2011   "once' (01/18/2013)  . Renal disorder    kindey function low; "Metformin was destroying my kidneys" (01/19/2013)  . Shortness of breath    "only related to my CHF" (01/18/2013)  . Swelling of hand 08/31/2014   RT HAND  . Type II diabetes mellitus ([place])   . UTI (urinary tract infection) 08/31/2014    Past Surgical History: Past Surgical History:  Procedure Laterality Date  . CARDIAC CATHETERIZATION N/A 03/13/2015   Procedure: Right/Left Heart Cath and Coronary Angiography;  Surgeon: Adrian Prows, MD;  Location: [facility];  Service: Cardiovascular;  Laterality: N/A;  . CATARACT EXTRACTION W/ INTRAOCULAR LENS  IMPLANT, BILATERAL Bilateral 2006-2011  . CESAREAN SECTION  1983  . CHOLECYSTECTOMY  ~ 2002  . COLONOSCOPY N/A 01/21/2013   Procedure: COLONOSCOPY;  Surgeon: Beryle Beams, MD;  Location: [facility];  Service: Endoscopy;  Laterality: N/A;  . EYE SURGERY Bilateral    "multiple" (01/18/2013)  . PACEMAKER IMPLANT N/A 08/31/2018   Symptomatic bradycardia due to mobitz II second degree AV block, permanent afib/ atypical atrial flutter implanted by Dr Rayann Heman  . PARS PLANA REPAIR OF RETINAL DEATACHMENT Right   . PARS PLANA VITRECTOMY Bilateral 2004-2006   "several" (01/18/2013)  . REFRACTIVE SURGERY Bilateral    "for stigmatism" (01/18/2013)  . REFRACTIVE SURGERY Left ~ 11/2012   "to puff it up cause vision got hazy" (01/18/2013)  . VENA CAVA FILTER PLACEMENT  2011?    Family History: Family History  Problem Relation Age of Onset  . Cerebral aneurysm Mother   . Hypertension Father   . Cerebral aneurysm  Maternal Grandfather   . Cerebral aneurysm Maternal Aunt   . Cancer Maternal Uncle     Social History: Social History   Socioeconomic History  . Marital status: Single    Spouse name: Not on file  . Number of children: Not  on file  . Years of education: Not on file  . Highest education level: Not on file  Occupational History  . Occupation: disabled  Social Needs  . Financial resource strain: Not on file  . Food insecurity    Worry: Not on file    Inability: Not on file  . Transportation needs    Medical: Not on file    Non-medical: Not on file  Tobacco Use  . Smoking status: Former Smoker    Packs/day: 0.05    Years: 30.00    Pack years: 1.50    Types: Cigarettes    Quit date: 1990    Years since quitting: 30.7  . Smokeless tobacco: Never Used  . Tobacco comment: 01/19/2013 "quit smoking cigarettes in the early '90's"  Substance and Sexual Activity  . Alcohol use: Not Currently    Comment: rare now but never a heavy drinker  . Drug use: No  . Sexual activity: Never  Lifestyle  . Physical activity    Days per week: Not on file    Minutes per session: Not on file  . Stress: Not on file  Relationships  . Social Herbalist on phone: Not on file    Gets together: Not on file    Attends religious service: Not on file    Active member of club or organization: Not on file    Attends meetings of clubs or organizations: Not on file    Relationship status: Not on file  Other Topics Concern  . Not on file  Social History Narrative  . Not on file    Allergies:  Allergies  Allergen Reactions  . Morphine And Related Rash    Objective:    Vital Signs:   Temp:  [97.8 F (36.6 C)-98.4 F (36.9 C)] 98.3 F (36.8 C) (09/23 1327) Pulse Rate:  [51-72] 72 (09/23 1327) Resp:  [18-20] 20 (09/23 1327) BP: (110-133)/(39-77) 131/70 (09/23 1327) SpO2:  [93 %-98 %] 93 % (09/23 1327) Weight:  [116.7 kg] 116.7 kg (09/23 0205) Last BM Date: 12/27/18  Weight  change: Filed Weights   12/27/18 0434 12/28/18 0400 12/29/18 0205  Weight: 115 kg 115.9 kg 116.7 kg    Intake/Output:   Intake/Output Summary (Last 24 hours) at 12/29/2018 1538 Last data filed at 12/29/2018 1300 Gross per 24 hour  Intake 723 ml  Output 1600 ml  Net -877 ml      Physical Exam    General:  No resp difficulty HEENT: normal Neck: supple. JVP does not appear elevated. . Carotids 2+ bilat; no bruits. No lymphadenopathy or thyromegaly appreciated. Cor: PMI nondisplaced. Regular rate & rhythm. No rubs, gallops or murmurs. Lungs: clear Abdomen: obee soft, nontender, nondistended. No hepatosplenomegaly. No bruits or masses. Good bowel sounds. Extremities: no cyanosis, clubbing, rash, R and LLE hyperpigmentation Neuro: alert & orientedx3, cranial nerves grossly intact. moves all 4 extremities w/o difficulty. Affect pleasant   Telemetry   Sinus Loletha Grayer with vpacing at 50 Personally reviewed    EKG    SR 1st degree heart block 240 Leah on admit   Labs   Basic Metabolic Panel: Recent Labs  Lab 12/26/18 1212 12/27/18 0539 12/28/18 0447 12/29/18 0429  NA 139 140 138 136  K 4.7 4.5 4.3 3.7  CL 99 97* 99 99  CO2 _0 GLUCOSE 100* 93 102* 96  BUN 51* 55* 58* 58*  CREATININE 2.23* 2.62* 2.55* 2.34*  CALCIUM 10.4* 9.7 8.9 8.8*  Liver Function Tests: No results for input(s): AST, ALT, ALKPHOS, BILITOT, PROT, ALBUMIN in the last 168 hours. No results for input(s): LIPASE, AMYLASE in the last 168 hours. No results for input(s): AMMONIA in the last 168 hours.  CBC: Recent Labs  Lab 12/26/18 1212 12/27/18 0539  WBC 6.0 6.1  NEUTROABS 4.1 3.8  HGB 13.1 13.2  HCT 42.3 41.3  MCV 102.7* 101.2*  PLT 163 162    Cardiac Enzymes: No results for input(s): CKTOTAL, CKMB, CKMBINDEX, TROPONINI in the last 168 hours.  BNP: BNP (last 3 results) Recent Labs    12/26/18 1212  BNP 430.9*    ProBNP (last 3 results) No results for input(s): PROBNP in the  last 8760 hours.   CBG: Recent Labs  Lab 12/28/18 1137 12/28/18 1628 12/28/18 2113 12/29/18 0617 12/29/18 1118  GLUCAP 106* 118* 125* 85 127*    Coagulation Studies: Recent Labs    12/27/18 0539 12/28/18 0447 12/29/18 0429  LABPROT 26.9* 26.9* 26.7*  INR 2.5* 2.5* 2.5*     Imaging   No results found.   Medications:     Current Medications: . citalopram  20 mg Oral QHS  . ferrous sulfate  325 mg Oral Daily  . hydrALAZINE  25 mg Oral TID  . insulin glargine  10 Units Subcutaneous Daily  . isosorbide mononitrate  60 mg Oral Daily  . levothyroxine  75 mcg Oral QAC breakfast  . Melatonin  9 mg Oral QHS  . rosuvastatin  10 mg Oral QHS  . sodium chloride flush  3 mL Intravenous Q12H    Infusions: . sodium chloride    . cefTRIAXone (ROCEPHIN)  IV 1 g (12/28/18 1454)      Assessment/Plan   1. A/C Diastolic Heart Failure  ECHO 12/2017 EF 65-70% , moderately increased LVH, Diuresed with IV lasix. Appear euvolemic. At home she was taking torsemide 40 mg daily.  - Set up RHC to further assess hemodynamics and adjust diuretics.  -Need to check myeloma panel and PYP scan for possible amyloid.  - Will need sleep study set up down the road.   2. CKD  On admit creatinine 2.3 . Baseline creatinine is < 1.6-1.8   3, UTI On cefitriaxone.   4. H/O CHB with MDT PPM-  EP reprogrammed this admit to VVI 50   5. Marland Kitchen DMII On insulin.   6. . Hypothyroidism  On levothyroxine  7. H/O PEs, On coumadin.     Length of Stay: 3  Amy Clegg, NP  12/29/2018, 3:38 PM  Advanced Heart Failure Team Pager [phone] (M-F; 7a - 4p)  Please contact [facility] for night-coverage after hours (4p -7a ) and weekends on [email or web address]  Patient seen and examined with the above-signed Advanced Practice Provider and/or Housestaff. I personally reviewed laboratory data, imaging studies and relevant notes. I independently examined the patient and formulated the important aspects of the  plan. I have edited the note to reflect any of my changes or salient points. I have personally discussed the plan with the patient and/or family.  63 y/o woman with morbid obesity, HTN, DM, CKD 4 and diastolic HF. Earlier this yearly had recurrent syncope and found to have CHB and had PPM placed.   Now admitted with recurrent HF symptoms and AKI. Volume hard to assess  On echo LVEF 60% with moderate LVH. RV normal. + MAC Personally reviewed  On exam General:  Obese woman sitting in bed  No resp difficulty HEENT: normal Neck: supple.  JVP hard to see but seems abot 9 Carotids 2+ bilat; no bruits. No lymphadenopathy or thryomegaly appreciated. Cor: PMI nondisplaced. Brady regular 2/6 TR Lungs: clear Abdomen: obese soft, nontender, nondistended. No hepatosplenomegaly. No bruits or masses. Good bowel sounds. Extremities: no cyanosis, clubbing, rash, 1+ edema chronic venous stasis changes Neuro: alert & orientedx3, cranial nerves grossly intact. moves all 4 extremities w/o difficulty. Affect pleasant  Given constellation of CHB, LVH and valvular thickening agree with consideration of cardiac amyloidosis. Plan myeloma panel and PYP. RHC tomorrow to better assess volume status and PA pressures. Needs weight loss and repeat sleep study.   Glori Bickers, MD  5:03 PM

## 2018-12-29 NOTE — Progress Notes (Signed)
[place] for warfarin Indication: atrial fibrillation  Allergies  Allergen Reactions  . Morphine And Related Rash    Patient Measurements: Height: 5\' 2"  (157.5 cm) Weight: 257 lb 3.2 oz (116.7 kg) IBW/kg (Calculated) : 50.1  Vital Signs: Temp: 98.3 F (36.8 C) (09/23 1327) Temp Source: Oral (09/23 1327) BP: 131/70 (09/23 1327) Pulse Rate: 72 (09/23 1327)  Labs: Recent Labs    12/27/18 0539 12/28/18 0447 12/29/18 0429  HGB 13.2  --   --   HCT 41.3  --   --   PLT 162  --   --   LABPROT 26.9* 26.9* 26.7*  INR 2.5* 2.5* 2.5*  CREATININE 2.62* 2.55* 2.34*    Estimated Creatinine Clearance: 30.2 mL/min (A) (by C-G formula based on SCr of 2.34 mg/dL (H)).   Medical History: Past Medical History:  Diagnosis Date  . A-fib ([place])   . Anemia   . Anxiety   . Arthritis    "right hip; both knees; left wrist/shoulder; back" (01/19/2013"  . Bleeding on Coumadin 08/2012; 01/18/2013   BRBPR admissions (01/19/2013)  . CHF (congestive heart failure) ([place])    "2-3 times" (01/19/2013)  . Chronic lower back pain   . Depression   . DVT (deep venous thrombosis) ([place]) 10 years ago   numerous/notes 01/18/2013  . GERD (gastroesophageal reflux disease)   . Gout   . [id number])    "maybe weekly" (01/19/2013)  . Heart murmur   . High cholesterol    "been off RX for this at one time" (01/18/2013)  . History of blood transfusion 1983; 04/2012   "3 w/ childbirth; hospitalized for pain" (01/19/2013)  . Hypertension   . Hypothyroidism   . Migraines    "twice/yr maybe" (01/19/2013)  . OSA (obstructive sleep apnea)    "sent me for test in 04/2012; never ordered mask, etc" (01/19/2013)  . PE (pulmonary thromboembolism) ([place]) 3 years ag0   3/notes 01/18/2013  . Pneumonia before 2011   "once' (01/18/2013)  . Renal disorder    kindey function low; "Metformin was destroying my kidneys" (01/19/2013)  . Shortness of breath    "only related to my  CHF" (01/18/2013)  . Swelling of hand 08/31/2014   RT HAND  . Type II diabetes mellitus ([place])   . UTI (urinary tract infection) 08/31/2014   Assessment: 39 yof presented to the ED with cough, shortness of breath and CP. She is on chronic warfarin for history of afib. INR is therapeutic at 2.5. CBC is WNL last checked 9/21 and no bleeding noted. Cardiologist noted 9/23 that he is holding warfarin and plans to start heparin when INR <2,  in prep for cardiac cath.   Last warfarin dose given 9/22 ~ 16:30.   PTA dose: 5mg  daily except 7.5 mg on Tues + Thurs  Goal of Therapy:  INR 2-3 Monitor platelets by anticoagulation protocol: Yes   Plan:  Warfarin held per Cardiologist in prep for Cath. Daily INR  Nicole Cella, RPh Clinical Pharmacist Phone [phone] Please check AMION for all [facility] phone numbers After 10:00 PM, call [facility] [phone]  12/29/2018 2:05 PM

## 2018-12-29 NOTE — Progress Notes (Signed)
PROGRESS NOTE    [name]  YNW:[id number] DOB: 03-Dec-1955 DOA: 12/26/2018 PCP: [name], MD    Brief Narrative:   [name] is a 63 y.o. female with medical history HTN, HLD, DM, CHF last EF 50-55%, A. fib on Coumadin, complete heart block  s/p PM, history of DVT/PE, and hypothyroidism; who presents with complaints of progressively worsening dry cough, shortness of breath, and chest pain.  She reports having dry cough and shortness of breath progressively worsening over the last week.  Associated symptoms include chest heaviness, fatigue, rhinorrhea, poor appetite, nausea, change in taste, dysuria couple days ago, and dizziness.  She had recently went to a cookout on Labor Day weekend with a bunch of people where she and or the day where mask.  Denies having any significant fever, vomiting, leg swelling or diarrhea.  Of note patient did fall a week and a half ago and bruised her right side.  Patient required pacemaker placement back in May of this year by Dr. Rayann Heman.  She was recently scheduled an in office visit because she was told that her pacemaker needed reprogramming.  Today when she woke up she just did not feel right and reported that it took her a very long time to even put her clothes.  At some point she felt as though she could not catch her breath told her husband to call EMS.  Upon admission into the emergency department patient was seen to be afebrile and hemodynamically stable.  Labs significant for BUN 51, creatinine 2.3, calcium 10.4, BNP 430.9, INR 2.5, and high-sensitivity troponin 54->54.  Urinalysis was positive for large leukocytes no bacteria seen and greater than 50 WBCs.  COVID-19 screening pending.  Chest x-ray showing signs of mild volume overload.  Patient was given 40 mg of Lasix IV and 1 g of Rocephin for suspected urinary tract infection.  TRH called to admit  Assessment & Plan:   Principal Problem:   Acute on chronic diastolic congestive heart  failure (HCC) Active Problems:   IDDM (insulin dependent diabetes mellitus) (HCC)   Morbid obesity, + sleep apnea   Acute lower UTI   Long term current use of anticoagulant therapy   Volume depletion  Acute on chronic diastolic congestive heart failure:  Patient presenting with generalized weakness shortness of breath, chest x-ray showed signs of mild volume overload. BNP 430.9.  TTE with EF 65-70% with pseudonormalization of LV consistent with diastolic dysfunction.  Normal right around 245 pounds, recent weight at her doctor's office on 12/14/2018 was 260 pounds.  Was 251 pounds in the ED. --Cardiology following, appreciate assistance --Initially started on IV furosemide for diuresis, although creatinine worsened, with decreased urine output --Cardiology believes that her symptoms may be related to her underlying pulmonary hypertension rather than volume overload --Heart failure team consulted today, pending right heart catheterization to assess hemodynamics --Pending NM cardiac amyloid scan --Multiple myeloma panel pending --Holding on further diuresis for now --Continue to monitor strict I's and O's and daily weights --Follow renal function closely  Presyncopal episode  On the night of 12/27/2018, patient fell in the bathroom with tech helping her.  Reported dizziness without LOC.  X-rays with no obvious fracture.  EP was consulted and pacemaker was interrogated which was normal.  Duplex ultrasound with minimal stenosis bilateral ICAs 1-39%. --Continue fall precautions  EColi UTI, POA  With dysuria symptoms recently.  Continue on ceftriaxone, E. coli pansensitive, plan 5d course  IDDM:  Last hemoglobin A1c back in  May was 6.3.  Blood sugar well controlled. --continue Lantus and sliding scale insulin.  Morbid obesity with + sleep apnea: Advised weight loss and lifestyle and outpatient follow-up.  Chest pain and elevated troponin:  high-sensitivity troponin 54->54.Patient  complained of chest pain.    Echo with preserved EF. --Cardiology following --Continue to monitor on telemetry --Currently chest pain-free --Statin  Acute kidney injury in the setting of CKD stage III Baseline creatinine around 1.6, on admission 2.2 , trending up to 2.6 in the setting of IV Lasix use.  --Holding furosemide for now per cardiology --Cr 2.6-->2.55-->2.34 --Continue monitor renal function daily  Atypical flutter/S/P pacemaker placement Recent admission in May for symptomatic bradycardia secondary AV block requiring pacemaker placement, interrogation done 9/22: "Battery and lead measurements are goodNo HVR or device observations. The device had already reduced V lead outputs to chronic output of 2.5V/0.85m".   --Currently in NSR --continue anticoagulant with Coumadin. --INR 2.5 today  COPD: stable, cont  albuterol  Anxiety/depression: stable, continue Celexa  Essential hypertension: BP controlled cont hydralazineand isosorbide mononitrate  Hypothyroidism: Last TSH 1.773 on 08/29/2018. Continue levothyroxine 75 mcg p.o. daily  Hyperlipidemia: cont crestor 10 mg p.o. daily  History of recurrent PE/DVT, A flutter atypical. PAF, on chronic anticoagulation: INR therapeutic at 2.5 on admission.  Coumadin- pharmacy dosing.    DVT prophylaxis: Coumadin Code Status: Full code Family Communication: None Disposition Plan: Continue inpatient hospitalization, pending right heart catheterization, NM amyloidosis scan, further depending on clinical course   Consultants:   Cardiology  Procedures:  Transthoracic echocardiogram 12/27/2018: IMPRESSIONS    1. Left ventricular ejection fraction, by visual estimation, is 65 to 70%. The left ventricle has normal function. Normal left ventricular size. There is moderately increased left ventricular hypertrophy.  2. Left ventricular diastolic Doppler parameters are consistent with pseudonormalization pattern of LV diastolic  filling.  3. Global right ventricle has normal systolic function.The right ventricular size is normal. No increase in right ventricular wall thickness.  4. Left atrial size was mildly dilated.  5. Right atrial size was normal.  6. Mild to moderate aortic valve annular calcification.  7. Moderate to severe mitral annular calcification.  8. The mitral valve is normal in structure. No evidence of mitral valve regurgitation. No evidence of mitral stenosis.  9. The tricuspid valve is normal in structure. Tricuspid valve regurgitation was not visualized by color flow Doppler. 10. The aortic valve is normal in structure. Aortic valve regurgitation was not visualized by color flow Doppler. Mild to moderate aortic valve sclerosis/calcification without any evidence of aortic stenosis. 11. The pulmonic valve was normal in structure. Pulmonic valve regurgitation is not visualized by color flow Doppler. 12. Normal pulmonary artery systolic pressure. 13. A pacer wire is visualized. 14. The inferior vena cava is normal in size with greater than 50% respiratory variability, suggesting right atrial pressure of 3 mmHg.  Antimicrobials:   Ceftriaxone 9/20>> plan 5-day course   Subjective: Patient seen and examined at bedside, resting comfortably in bedside chair with legs elevated.  Feels good today.  No significant complaints.  States that cardiology is attempting to may be have heart catheterization tomorrow versus Friday.  Denies headache, no fever/chills/night sweats, no nausea/vomiting/diarrhea, no chest pain, no palpitations, no shortness of breath, no abdominal pain, no weakness, no fatigue, no paresthesias.  No acute events overnight per nursing staff.  Objective: Vitals:   12/29/18 0205 12/29/18 0417 12/29/18 1016 12/29/18 1327  BP:  (!) 110/39 133/77 131/70  Pulse:  (!) 54 (!)  51 72  Resp:  18  20  Temp:  97.8 F (36.6 C)  98.3 F (36.8 C)  TempSrc:  Oral  Oral  SpO2:  93%  93%  Weight: 116.7  kg     Height:        Intake/Output Summary (Last 24 hours) at 12/29/2018 1615 Last data filed at 12/29/2018 1300 Gross per 24 hour  Intake 723 ml  Output 1600 ml  Net -877 ml   Filed Weights   12/27/18 0434 12/28/18 0400 12/29/18 0205  Weight: 115 kg 115.9 kg 116.7 kg    Examination:  General exam: Appears calm and comfortable  Respiratory system: Clear to auscultation. Respiratory effort normal. Cardiovascular system: S1 & S2 heard, RRR. No JVD, murmurs, rubs, gallops or clicks. No pedal edema. Gastrointestinal system: Abdomen is nondistended, soft and nontender. No organomegaly or masses felt. Normal bowel sounds heard. Central nervous system: Alert and oriented. No focal neurological deficits. Extremities: Symmetric 5 x 5 power. Skin: Chronic hyperpigmentation noted bilateral lower extremities Psychiatry: Judgement and insight appear normal. Mood & affect appropriate.     Data Reviewed: I have personally reviewed following labs and imaging studies  CBC: Recent Labs  Lab 12/26/18 1212 12/27/18 0539  WBC 6.0 6.1  NEUTROABS 4.1 3.8  HGB 13.1 13.2  HCT 42.3 41.3  MCV 102.7* 101.2*  PLT 163 378   Basic Metabolic Panel: Recent Labs  Lab 12/26/18 1212 12/27/18 0539 12/28/18 0447 12/29/18 0429  NA 139 140 138 136  K 4.7 4.5 4.3 3.7  CL 99 97* 99 99  CO2 28 31 29 30   GLUCOSE 100* 93 102* 96  BUN 51* 55* 58* 58*  CREATININE 2.23* 2.62* 2.55* 2.34*  CALCIUM 10.4* 9.7 8.9 8.8*   GFR: Estimated Creatinine Clearance: 30.2 mL/min (A) (by C-G formula based on SCr of 2.34 mg/dL (H)). Liver Function Tests: No results for input(s): AST, ALT, ALKPHOS, BILITOT, PROT, ALBUMIN in the last 168 hours. No results for input(s): LIPASE, AMYLASE in the last 168 hours. No results for input(s): AMMONIA in the last 168 hours. Coagulation Profile: Recent Labs  Lab 12/26/18 1212 12/27/18 0539 12/28/18 0447 12/29/18 0429  INR 2.5* 2.5* 2.5* 2.5*   Cardiac Enzymes: No results  for input(s): CKTOTAL, CKMB, CKMBINDEX, TROPONINI in the last 168 hours. BNP (last 3 results) No results for input(s): PROBNP in the last 8760 hours. HbA1C: No results for input(s): HGBA1C in the last 72 hours. CBG: Recent Labs  Lab 12/28/18 1137 12/28/18 1628 12/28/18 2113 12/29/18 0617 12/29/18 1118  GLUCAP 106* 118* 125* 85 127*   Lipid Profile: No results for input(s): CHOL, HDL, LDLCALC, TRIG, CHOLHDL, LDLDIRECT in the last 72 hours. Thyroid Function Tests: No results for input(s): TSH, T4TOTAL, FREET4, T3FREE, THYROIDAB in the last 72 hours. Anemia Panel: No results for input(s): VITAMINB12, FOLATE, FERRITIN, TIBC, IRON, RETICCTPCT in the last 72 hours. Sepsis Labs: No results for input(s): PROCALCITON, LATICACIDVEN in the last 168 hours.  Recent Results (from the past 240 hour(s))  Urine culture     Status: Abnormal   Collection Time: 12/26/18  1:43 PM   Specimen: Urine, Random  Result Value Ref Range Status   Specimen Description URINE, RANDOM  Final   Special Requests   Final    NONE Performed at [facility], [address] [place], [place] [address]    Culture >=100,000 COLONIES/mL ESCHERICHIA COLI (A)  Final   Report Status 12/28/2018 FINAL  Final   Organism ID,  Bacteria ESCHERICHIA COLI (A)  Final      Susceptibility   Escherichia coli - MIC*    AMPICILLIN <=2 SENSITIVE Sensitive     CEFAZOLIN <=4 SENSITIVE Sensitive     CEFTRIAXONE <=1 SENSITIVE Sensitive     CIPROFLOXACIN <=0.25 SENSITIVE Sensitive     GENTAMICIN <=1 SENSITIVE Sensitive     IMIPENEM <=0.25 SENSITIVE Sensitive     NITROFURANTOIN <=16 SENSITIVE Sensitive     TRIMETH/SULFA <=20 SENSITIVE Sensitive     AMPICILLIN/SULBACTAM <=2 SENSITIVE Sensitive     PIP/TAZO <=4 SENSITIVE Sensitive     Extended ESBL NEGATIVE Sensitive     * >=100,000 COLONIES/mL ESCHERICHIA COLI  SARS CORONAVIRUS 2 (TAT 6-24 HRS) Nasopharyngeal Nasopharyngeal Swab     Status: None   Collection Time: 12/26/18   2:00 PM   Specimen: Nasopharyngeal Swab  Result Value Ref Range Status   SARS Coronavirus 2 NEGATIVE NEGATIVE Final    Comment: (NOTE) SARS-CoV-2 target nucleic acids are NOT DETECTED. The SARS-CoV-2 RNA is generally detectable in upper and lower respiratory specimens during the acute phase of infection. Negative results do not preclude SARS-CoV-2 infection, do not rule out co-infections with other pathogens, and should not be used as the sole basis for treatment or other patient management decisions. Negative results must be combined with clinical observations, patient history, and epidemiological information. The expected result is Negative. Fact Sheet for Patients: SugarRoll.be Fact Sheet for Healthcare Providers: https://www.woods-mathews.com/ This test is not yet approved or cleared by the [place] FDA and  has been authorized for detection and/or diagnosis of SARS-CoV-2 by FDA under an Emergency Use Authorization (EUA). This EUA will remain  in effect (meaning this test can be used) for the duration of the COVID-19 declaration under Section 56 4(b)(1) of the Act, 21 U.S.C. section 360bbb-3(b)(1), unless the authorization is terminated or revoked sooner. Performed at [facility], [place] [address] [place], [place] [address]          Radiology Studies: Dg Cervical Spine Complete  Result Date: 12/27/2018 CLINICAL DATA:  63 year old female with neck pain. EXAM: CERVICAL SPINE - COMPLETE 4+ VIEW COMPARISON:  Cervical spine radiograph dated 09/16/2018 FINDINGS: Evaluation is limited due to patient positioning and soft tissue attenuation. No definite acute fracture or subluxation identified. There is multilevel degenerative changes and osteophyte. The visualized odontoid appears intact. There is anatomic alignment the lateral masses of C1 and C2. The soft tissues are grossly unremarkable. IMPRESSION: 1. No definite acute fracture  or subluxation. 2. Multilevel degenerative changes. Electronically Signed   By: Anner Crete M.D.   On: 12/27/2018 22:30   Dg Thoracic Spine W/swimmers  Result Date: 12/27/2018 CLINICAL DATA:  63 year old female with fall and back pain. EXAM: THORACIC SPINE - 3 VIEWS COMPARISON:  Chest radiograph dated 12/26/2018 and 09/01/2018 FINDINGS: Evaluation is limited due to superimposition of the soft tissues. No definite acute fracture or subluxation of the thoracic spine. Multilevel degenerative changes and osteophyte. Left pectoral pacemaker device. Soft tissues are grossly unremarkable. IMPRESSION: No definite acute fracture or subluxation of the thoracic spine. Electronically Signed   By: Anner Crete M.D.   On: 12/27/2018 23:30   Dg Ankle Complete Left  Result Date: 12/28/2018 CLINICAL DATA:  Left ankle pain after fall in bathroom. EXAM: LEFT ANKLE COMPLETE - 3+ VIEW COMPARISON:  None. FINDINGS: No acute fracture or dislocation. Ankle mortise is preserved. Mild mid and hindfoot osteoarthritis. Plantar calcaneal spur and Achilles tendon enthesophyte. Possible small ankle joint effusion. Bones  appear under mineralized. Mild soft tissue edema. IMPRESSION: 1. No acute fracture or dislocation of the left ankle. 2. Mild mid and hindfoot osteoarthritis. Electronically Signed   By: Keith Rake M.D.   On: 12/28/2018 00:11   Vas US Carotid  Result Date: 12/29/2018 Carotid Arterial Duplex Study Indications:   Syncope. Risk Factors:  Hypertension, Diabetes. Other Factors: History of pulmonary embolism. Performing Technologist: Darlina Sicilian RDCS  Examination Guidelines: A complete evaluation includes B-mode imaging, spectral Doppler, color Doppler, and power Doppler as needed of all accessible portions of each vessel. Bilateral testing is considered an integral part of a complete examination. Limited examinations for reoccurring indications may be performed as noted.  Right Carotid Findings:  +----------+--------+--------+--------+-------------------------+--------+           PSV cm/sEDV cm/sStenosisPlaque Description       Comments +----------+--------+--------+--------+-------------------------+--------+ CCA Prox  98      2                                                 +----------+--------+--------+--------+-------------------------+--------+ CCA Distal42      8                                                 +----------+--------+--------+--------+-------------------------+--------+ ICA Prox  53      15      1-39%   hyperechoic and irregular         +----------+--------+--------+--------+-------------------------+--------+ ICA Mid   62      19                                                +----------+--------+--------+--------+-------------------------+--------+ ICA Distal54      15                                                +----------+--------+--------+--------+-------------------------+--------+ ECA       92      91                                                +----------+--------+--------+--------+-------------------------+--------+ +----------+--------+-------+--------+-------------------+           PSV cm/sEDV cmsDescribeArm Pressure (mmHG) +----------+--------+-------+--------+-------------------+ [id number]                                         +----------+--------+-------+--------+-------------------+ +---------+--------+--+--------+-+ VertebralPSV cm/s51EDV cm/s9 +---------+--------+--+--------+-+  Left Carotid Findings: +----------+--------+--------+--------+-------------------------+--------+           PSV cm/sEDV cm/sStenosisPlaque Description       Comments +----------+--------+--------+--------+-------------------------+--------+ CCA Prox  80      6                                                 +----------+--------+--------+--------+-------------------------+--------+  CCA Distal51      8                                                  +----------+--------+--------+--------+-------------------------+--------+ ICA Prox  65      20      1-39%   hyperechoic and irregular         +----------+--------+--------+--------+-------------------------+--------+ ICA Mid   58      17                                                +----------+--------+--------+--------+-------------------------+--------+ ICA Distal41      12                                                +----------+--------+--------+--------+-------------------------+--------+ ECA       96      2                                                 +----------+--------+--------+--------+-------------------------+--------+ +----------+--------+--------+--------+-------------------+           PSV cm/sEDV cm/sDescribeArm Pressure (mmHG) +----------+--------+--------+--------+-------------------+ [id number]                                         +----------+--------+--------+--------+-------------------+ +---------+--------+--+--------+--+ VertebralPSV cm/s68EDV cm/s11 +---------+--------+--+--------+--+     Preliminary         Scheduled Meds: . citalopram  20 mg Oral QHS  . ferrous sulfate  325 mg Oral Daily  . hydrALAZINE  25 mg Oral TID  . insulin glargine  10 Units Subcutaneous Daily  . isosorbide mononitrate  60 mg Oral Daily  . levothyroxine  75 mcg Oral QAC breakfast  . Melatonin  9 mg Oral QHS  . rosuvastatin  10 mg Oral QHS  . sodium chloride flush  3 mL Intravenous Q12H   Continuous Infusions: . sodium chloride    . cefTRIAXone (ROCEPHIN)  IV 1 g (12/28/18 1454)     LOS: 3 days    Time spent: 36 minutes spent on chart review, discussion with nursing staff, consultants, person reviewing all imaging studies and labs, updating family and interview/physical exam; more than 50% of that time was spent in counseling and/or coordination of care.    Memorie Yokoyama J [place] DO [facility] Pager [phone]  If 7PM-7AM, please contact night-coverage [email or web address] Password [facility] 12/29/2018, 4:15 PM

## 2018-12-29 NOTE — Progress Notes (Signed)
Carotid artery duplex is complete, results are located under CV Proc.  Darlina Sicilian RDCS

## 2018-12-29 NOTE — Care Management Important Message (Signed)
Important Message  Patient Details  Name: [name] MRN: [id number] Date of Birth: 1956-01-20   Medicare Important Message Given:  Yes     Shelda Altes 12/29/2018, 1:47 PM

## 2018-12-29 NOTE — Progress Notes (Addendum)
Progress Note  Patient Name: [name] Date of Encounter: 12/29/2018  Primary Cardiologist: Acie Fredrickson / [facility]    Subjective   The patient is a 63 year old female with a history of hypertension, hyperlipidemia, obesity, history of pulmonary emboli, atrial fibrillation who was admitted with generalized weakness and increased shortness of breath.  She is had a history of pulmonary emboli and pulmonary hypertension in the past.  She is had increasing fatigue, cough and shortness of breath for the past several weeks.  She was admitted to the hospital for further evaluation.  Inpatient Medications    Scheduled Meds: . citalopram  20 mg Oral QHS  . ferrous sulfate  325 mg Oral Daily  . hydrALAZINE  25 mg Oral TID  . insulin glargine  10 Units Subcutaneous Daily  . isosorbide mononitrate  60 mg Oral Daily  . levothyroxine  75 mcg Oral QAC breakfast  . Melatonin  9 mg Oral QHS  . rosuvastatin  10 mg Oral QHS  . sodium chloride flush  3 mL Intravenous Q12H   Continuous Infusions: . sodium chloride    . cefTRIAXone (ROCEPHIN)  IV 1 g (12/28/18 1454)   PRN Meds: sodium chloride, acetaminophen, HYDROcodone-acetaminophen, insulin aspart, ondansetron (ZOFRAN) IV, sodium chloride flush   Vital Signs    Vitals:   12/28/18 1140 12/28/18 1915 12/29/18 0205 12/29/18 0417  BP: 105/64 (!) 118/49  (!) 110/39  Pulse: 61 60  (!) 54  Resp: 20 18  18   Temp: (!) 97.5 F (36.4 C) 98.4 F (36.9 C)  97.8 F (36.6 C)  TempSrc: Oral Oral  Oral  SpO2: 99% 98%  93%  Weight:   116.7 kg   Height:        Intake/Output Summary (Last 24 hours) at 12/29/2018 0855 Last data filed at 12/29/2018 0400 Gross per 24 hour  Intake 616.36 ml  Output 1750 ml  Net -1133.64 ml   Last 3 Weights 12/29/2018 12/28/2018 12/27/2018  Weight (lbs) 257 lb 3.2 oz 255 lb 9.6 oz 253 lb 9.6 oz  Weight (kg) 116.665 kg 115.939 kg 115.032 kg      Telemetry    NSR - Personally Reviewed  ECG      - Personally  Reviewed  Physical Exam   GEN:  morbidly obese female,  NAD  Neck: No JVD Cardiac: RRR,  2/6 systolic murmur  Respiratory: Clear to auscultation bilaterally. GI: obese, non tender  MS:  chronic stasis changes,   Trace edema  Neuro:  Nonfocal  Psych: Normal affect   Labs    High Sensitivity Troponin:   Recent Labs  Lab 12/26/18 1212 12/26/18 1343  TROPONINIHS 54* 54*      Chemistry Recent Labs  Lab 12/27/18 0539 12/28/18 0447 12/29/18 0429  NA 140 138 136  K 4.5 4.3 3.7  CL 97* 99 99  CO2 31 29 30   GLUCOSE 93 102* 96  BUN 55* 58* 58*  CREATININE 2.62* 2.55* 2.34*  CALCIUM 9.7 8.9 8.8*  GFRNONAA 19* 19* 22*  GFRAA 22* 23* 25*  ANIONGAP 12 10 7      Hematology Recent Labs  Lab 12/26/18 1212 12/27/18 0539  WBC 6.0 6.1  RBC 4.12 4.08  HGB 13.1 13.2  HCT 42.3 41.3  MCV 102.7* 101.2*  MCH 31.8 32.4  MCHC 31.0 32.0  RDW 15.6* 15.7*  PLT 163 162    BNP Recent Labs  Lab 12/26/18 1212  BNP 430.9*     DDimer  Recent Labs  Lab 12/26/18 1212  DDIMER 0.37     Radiology    Dg Cervical Spine Complete  Result Date: 12/27/2018 CLINICAL DATA:  63 year old female with neck pain. EXAM: CERVICAL SPINE - COMPLETE 4+ VIEW COMPARISON:  Cervical spine radiograph dated 09/16/2018 FINDINGS: Evaluation is limited due to patient positioning and soft tissue attenuation. No definite acute fracture or subluxation identified. There is multilevel degenerative changes and osteophyte. The visualized odontoid appears intact. There is anatomic alignment the lateral masses of C1 and C2. The soft tissues are grossly unremarkable. IMPRESSION: 1. No definite acute fracture or subluxation. 2. Multilevel degenerative changes. Electronically Signed   By: Anner Crete M.D.   On: 12/27/2018 22:30   Dg Thoracic Spine W/swimmers  Result Date: 12/27/2018 CLINICAL DATA:  63 year old female with fall and back pain. EXAM: THORACIC SPINE - 3 VIEWS COMPARISON:  Chest radiograph dated  12/26/2018 and 09/01/2018 FINDINGS: Evaluation is limited due to superimposition of the soft tissues. No definite acute fracture or subluxation of the thoracic spine. Multilevel degenerative changes and osteophyte. Left pectoral pacemaker device. Soft tissues are grossly unremarkable. IMPRESSION: No definite acute fracture or subluxation of the thoracic spine. Electronically Signed   By: Anner Crete M.D.   On: 12/27/2018 23:30   Dg Ankle Complete Left  Result Date: 12/28/2018 CLINICAL DATA:  Left ankle pain after fall in bathroom. EXAM: LEFT ANKLE COMPLETE - 3+ VIEW COMPARISON:  None. FINDINGS: No acute fracture or dislocation. Ankle mortise is preserved. Mild mid and hindfoot osteoarthritis. Plantar calcaneal spur and Achilles tendon enthesophyte. Possible small ankle joint effusion. Bones appear under mineralized. Mild soft tissue edema. IMPRESSION: 1. No acute fracture or dislocation of the left ankle. 2. Mild mid and hindfoot osteoarthritis. Electronically Signed   By: Keith Rake M.D.   On: 12/28/2018 00:11    Cardiac Studies      Patient Profile     63 y.o. female admitted with generalized fatigue and worsening dyspnea.   Assessment & Plan    1.  Chronic diastolic congestive heart failure.  Is very difficult to tell if she is intravascularly volume overloaded or not.  She has had moderate pulmonary hypertension in the past.  But she also has signs of volume depletion such as reduced skin turgor and elevated BUN and creatinine compared to her baseline.  She also has noted that her urine output has been reduced over the past several days.  Of hold her Lasix for now.   I will discuss with the CHF team this afternoon .   Continue to follow .   2.  History of DVT with pulmonary emboli: We have held Coumadin.  She will get heparin when her INR is less than 2.  3.  Morbid obesity :   She is working on weight loss   4.  HTN:   Currently well controlled.       For questions or  updates, please contact [place] Please consult [email or web address] for contact info under        Signed, Mertie Moores, MD  12/29/2018, 8:55 AM

## 2018-12-30 ENCOUNTER — Encounter (HOSPITAL_COMMUNITY)
Admission: EM | Disposition: A | Discharge status: Home Health | Payer: Self-pay | Source: Home / Self Care | Attending: Internal Medicine

## 2018-12-30 ENCOUNTER — Inpatient Hospital Stay (HOSPITAL_COMMUNITY): Payer: Medicare HMO

## 2018-12-30 ENCOUNTER — Encounter (HOSPITAL_COMMUNITY): Payer: Self-pay | Admitting: Internal Medicine

## 2018-12-30 HISTORY — PX: RIGHT HEART CATH: CATH118263

## 2018-12-30 LAB — GLUCOSE, CAPILLARY
Glucose-Capillary: 88 mg/dL (ref 70–99)
Glucose-Capillary: 94 mg/dL (ref 70–99)
Glucose-Capillary: 130 mg/dL — ABNORMAL HIGH (ref 70–99)

## 2018-12-30 LAB — POCT I-STAT EG7
Acid-Base Excess: 2 mmol/L (ref 0.0–2.0)
Bicarbonate: 25.5 mmol/L (ref 20.0–28.0)
Bicarbonate: 25.5 mmol/L (ref 20.0–28.0)
Bicarbonate: 27.9 mmol/L (ref 20.0–28.0)
Calcium, Ion: 1 mmol/L — ABNORMAL LOW (ref 1.15–1.40)
Calcium, Ion: 1.09 mmol/L — ABNORMAL LOW (ref 1.15–1.40)
Calcium, Ion: 1.25 mmol/L (ref 1.15–1.40)
HCT: 35 % — ABNORMAL LOW (ref 36.0–46.0)
HCT: 38 % (ref 36.0–46.0)
HCT: 39 % (ref 36.0–46.0)
Hemoglobin: 11.9 g/dL — ABNORMAL LOW (ref 12.0–15.0)
Hemoglobin: 12.9 g/dL (ref 12.0–15.0)
Hemoglobin: 13.3 g/dL (ref 12.0–15.0)
O2 Saturation: 79 %
O2 Saturation: 80 %
O2 Saturation: 80 %
Potassium: 3.3 mmol/L — ABNORMAL LOW (ref 3.5–5.1)
Potassium: 3.6 mmol/L (ref 3.5–5.1)
Potassium: 3.9 mmol/L (ref 3.5–5.1)
Sodium: 141 mmol/L (ref 135–145)
Sodium: 143 mmol/L (ref 135–145)
Sodium: 145 mmol/L (ref 135–145)
TCO2: 27 mmol/L (ref 22–32)
TCO2: 27 mmol/L (ref 22–32)
TCO2: 29 mmol/L (ref 22–32)
pCO2, Ven: 42.5 mmHg — ABNORMAL LOW (ref 44.0–60.0)
pCO2, Ven: 44.4 mmHg (ref 44.0–60.0)
pCO2, Ven: 46.9 mmHg (ref 44.0–60.0)
pH, Ven: 7.367 (ref 7.250–7.430)
pH, Ven: 7.382 (ref 7.250–7.430)
pH, Ven: 7.387 (ref 7.250–7.430)
pO2, Ven: 45 mmHg (ref 32.0–45.0)
pO2, Ven: 45 mmHg (ref 32.0–45.0)
pO2, Ven: 46 mmHg — ABNORMAL HIGH (ref 32.0–45.0)

## 2018-12-30 LAB — BASIC METABOLIC PANEL
Anion gap: 11 (ref 5–15)
BUN: 59 mg/dL — ABNORMAL HIGH (ref 8–23)
CO2: 26 mmol/L (ref 22–32)
Calcium: 8.8 mg/dL — ABNORMAL LOW (ref 8.9–10.3)
Chloride: 101 mmol/L (ref 98–111)
Creatinine, Ser: 2.25 mg/dL — ABNORMAL HIGH (ref 0.44–1.00)
GFR calc Af Amer: 26 mL/min — ABNORMAL LOW (ref >60)
GFR calc non Af Amer: 23 mL/min — ABNORMAL LOW (ref >60)
Glucose, Bld: 93 mg/dL (ref 70–99)
Potassium: 3.7 mmol/L (ref 3.5–5.1)
Sodium: 138 mmol/L (ref 135–145)

## 2018-12-30 LAB — PROTIME-INR
INR: 2.4 — ABNORMAL HIGH (ref 0.8–1.2)
Prothrombin Time: 26.1 seconds — ABNORMAL HIGH (ref 11.4–15.2)

## 2018-12-30 SURGERY — RIGHT HEART CATH
Anesthesia: LOCAL

## 2018-12-30 MED ORDER — TECHNETIUM TC 99M PYROPHOSPHATE
21.1000 | Freq: Once | INTRAVENOUS | Status: AC | PRN
  Administered 2018-12-30: 21.1 via INTRAVENOUS
  Filled 2018-12-30: qty 22

## 2018-12-30 MED ORDER — SODIUM CHLORIDE 0.9% FLUSH
3.0000 mL | Freq: Two times a day (BID) | INTRAVENOUS | Status: DC
  Administered 2018-12-30 (×2): 3 mL via INTRAVENOUS

## 2018-12-30 MED ORDER — LIDOCAINE HCL (PF) 1 % IJ SOLN
INTRAMUSCULAR | Status: AC
  Filled 2018-12-30: qty 30

## 2018-12-30 MED ORDER — HYDRALAZINE HCL 20 MG/ML IJ SOLN: 10.0000 mg | INTRAMUSCULAR | Status: AC | PRN

## 2018-12-30 MED ORDER — HEPARIN (PORCINE) IN NACL 1000-0.9 UT/500ML-% IV SOLN
INTRAVENOUS | Status: DC | PRN
  Administered 2018-12-30: 500 mL

## 2018-12-30 MED ORDER — LIDOCAINE HCL (PF) 1 % IJ SOLN
INTRAMUSCULAR | Status: DC | PRN
  Administered 2018-12-30: 2 mL

## 2018-12-30 MED ORDER — SODIUM CHLORIDE 0.9 % IV SOLN: INTRAVENOUS | Status: DC

## 2018-12-30 MED ORDER — SODIUM CHLORIDE 0.9% FLUSH: 3.0000 mL | INTRAVENOUS | Status: DC | PRN

## 2018-12-30 MED ORDER — ONDANSETRON HCL 4 MG/2ML IJ SOLN: 4.0000 mg | Freq: Four times a day (QID) | INTRAMUSCULAR | Status: DC | PRN

## 2018-12-30 MED ORDER — SODIUM CHLORIDE 0.9 % IV SOLN: 250.0000 mL | INTRAVENOUS | Status: DC | PRN

## 2018-12-30 MED ORDER — SODIUM CHLORIDE 0.9% FLUSH
3.0000 mL | Freq: Two times a day (BID) | INTRAVENOUS | Status: DC
  Administered 2018-12-30: 21:00:00 3 mL via INTRAVENOUS

## 2018-12-30 MED ORDER — LABETALOL HCL 5 MG/ML IV SOLN: 10.0000 mg | INTRAVENOUS | Status: AC | PRN

## 2018-12-30 MED ORDER — ACETAMINOPHEN 325 MG PO TABS: 650.0000 mg | ORAL_TABLET | ORAL | Status: DC | PRN

## 2018-12-30 MED ORDER — SODIUM CHLORIDE 0.9 % IV SOLN
INTRAVENOUS | Status: DC
  Administered 2018-12-30: 09:00:00 via INTRAVENOUS

## 2018-12-30 MED ORDER — WARFARIN - PHARMACIST DOSING INPATIENT
Freq: Every day | Status: DC
  Administered 2018-12-30: 20:00:00

## 2018-12-30 MED ORDER — HEPARIN (PORCINE) IN NACL 1000-0.9 UT/500ML-% IV SOLN
INTRAVENOUS | Status: AC
  Filled 2018-12-30: qty 500

## 2018-12-30 MED ORDER — ASPIRIN 81 MG PO CHEW
81.0000 mg | CHEWABLE_TABLET | ORAL | Status: AC
  Administered 2018-12-30: 81 mg via ORAL
  Filled 2018-12-30: qty 1

## 2018-12-30 MED ORDER — ASPIRIN 81 MG PO CHEW: 81.0000 mg | CHEWABLE_TABLET | ORAL | Status: DC

## 2018-12-30 MED ORDER — WARFARIN SODIUM 7.5 MG PO TABS
7.5000 mg | ORAL_TABLET | Freq: Once | ORAL | Status: AC
  Administered 2018-12-30: 7.5 mg via ORAL
  Filled 2018-12-30: qty 1

## 2018-12-30 MED ORDER — HYDROCORTISONE 1 % EX CREA
TOPICAL_CREAM | Freq: Three times a day (TID) | CUTANEOUS | Status: DC
  Administered 2018-12-30 – 2018-12-31 (×2): via TOPICAL
  Filled 2018-12-30: qty 28

## 2018-12-30 SURGICAL SUPPLY — 6 items
CATH BALLN WEDGE 5F 110CM (CATHETERS) ×2 IMPLANT
GUIDEWIRE .025 260CM (WIRE) ×2 IMPLANT
KIT MICROPUNCTURE NIT STIFF (SHEATH) ×2 IMPLANT
PACK CARDIAC CATHETERIZATION (CUSTOM PROCEDURE TRAY) ×2 IMPLANT
SHEATH GLIDE SLENDER 4/5FR (SHEATH) ×4 IMPLANT
TRANSDUCER W/STOPCOCK (MISCELLANEOUS) ×2 IMPLANT

## 2018-12-30 NOTE — Progress Notes (Signed)
[place] for warfarin Indication: atrial fibrillation  Allergies  Allergen Reactions  . Morphine And Related Rash    Patient Measurements: Height: 5\' 2"  (157.5 cm) Weight: 257 lb 9.6 oz (116.8 kg) IBW/kg (Calculated) : 50.1  Vital Signs: Temp: 97.8 F (36.6 C) (09/24 0332) Temp Source: Oral (09/24 0332) BP: 146/58 (09/24 0332) Pulse Rate: 64 (09/24 0332)  Labs: Recent Labs    12/28/18 0447 12/29/18 0429 12/30/18 0418  LABPROT 26.9* 26.7* 26.1*  INR 2.5* 2.5* 2.4*  CREATININE 2.55* 2.34* 2.25*    Estimated Creatinine Clearance: 31.4 mL/min (A) (by C-G formula based on SCr of 2.25 mg/dL (H)).   Medical History: Past Medical History:  Diagnosis Date  . A-fib ([place])   . Anemia   . Anxiety   . Arthritis    "right hip; both knees; left wrist/shoulder; back" (01/19/2013"  . Bleeding on Coumadin 08/2012; 01/18/2013   [facility] admissions (01/19/2013)  . CHF (congestive heart failure) ([place])    "2-3 times" (01/19/2013)  . Chronic lower back pain   . Depression   . DVT (deep venous thrombosis) ([place]) 10 years ago   numerous/notes 01/18/2013  . GERD (gastroesophageal reflux disease)   . Gout   . [id number])    "maybe weekly" (01/19/2013)  . Heart murmur   . High cholesterol    "been off RX for this at one time" (01/18/2013)  . History of blood transfusion 1983; 04/2012   "3 w/ childbirth; hospitalized for pain" (01/19/2013)  . Hypertension   . Hypothyroidism   . Migraines    "twice/yr maybe" (01/19/2013)  . OSA (obstructive sleep apnea)    "sent me for test in 04/2012; never ordered mask, etc" (01/19/2013)  . PE (pulmonary thromboembolism) ([place]) 3 years ag0   3/notes 01/18/2013  . Pneumonia before 2011   "once' (01/18/2013)  . Renal disorder    kindey function low; "Metformin was destroying my kidneys" (01/19/2013)  . Shortness of breath    "only related to my CHF" (01/18/2013)  . Swelling of hand 08/31/2014   RT HAND  .  Type II diabetes mellitus ([place])   . UTI (urinary tract infection) 08/31/2014   Assessment: 45 yoF on warfarin for hx AF and VTE admitted with SOB now s/p diuresis. Warfarin resumed on admit but held yesterday with plans for cardiac cath. Per HF team, only planning RHC via venous access so ok to continue warfarin therapy. INR remains therapeutic today at 2.4.  PTA dose: 5mg  daily except 7.5 mg on Tues + Thurs  Goal of Therapy:  INR 2-3 Monitor platelets by anticoagulation protocol: Yes   Plan:  Warfarin 7.5mg  PO x1 tonight Daily protime   Arrie Senate, PharmD, BCPS Clinical Pharmacist [phone] Please check AMION for all [facility] Pharmacy numbers 12/30/2018

## 2018-12-30 NOTE — Plan of Care (Signed)
  Problem: Education: Goal: Knowledge of General Education information will improve Description Including pain rating scale, medication(s)/side effects and non-pharmacologic comfort measures Outcome: Progressing   

## 2018-12-30 NOTE — Interval H&P Note (Signed)
History and Physical Interval Note:  12/30/2018 4:19 PM  [name]  has presented today for surgery, with the diagnosis of heart failure.  The various methods of treatment have been discussed with the patient and family. After consideration of risks, benefits and other options for treatment, the patient has consented to  Procedure(s): RIGHT HEART CATH (N/A) as a surgical intervention.  The patient's history has been reviewed, patient examined, no change in status, stable for surgery.  I have reviewed the patient's chart and labs.  Questions were answered to the patient's satisfaction.     Daniel Bensimhon

## 2018-12-30 NOTE — Progress Notes (Signed)
PROGRESS NOTE    [name]  UTM:[id number] DOB: September 27, 1955 DOA: 12/26/2018 PCP: [name], MD    Brief Narrative:   [name] is a 63 y.o. female with medical history HTN, HLD, DM, CHF last EF 50-55%, A. fib on Coumadin, complete heart block  s/p PM, history of DVT/PE, and hypothyroidism; who presents with complaints of progressively worsening dry cough, shortness of breath, and chest pain.  She reports having dry cough and shortness of breath progressively worsening over the last week.  Associated symptoms include chest heaviness, fatigue, rhinorrhea, poor appetite, nausea, change in taste, dysuria couple days ago, and dizziness.  She had recently went to a cookout on Labor Day weekend with a bunch of people where she and or the day where mask.  Denies having any significant fever, vomiting, leg swelling or diarrhea.  Of note patient did fall a week and a half ago and bruised her right side.  Patient required pacemaker placement back in May of this year by Dr. Rayann Heman.  She was recently scheduled an in office visit because she was told that her pacemaker needed reprogramming.  Today when she woke up she just did not feel right and reported that it took her a very long time to even put her clothes.  At some point she felt as though she could not catch her breath told her husband to call EMS.  Upon admission into the emergency department patient was seen to be afebrile and hemodynamically stable.  Labs significant for BUN 51, creatinine 2.3, calcium 10.4, BNP 430.9, INR 2.5, and high-sensitivity troponin 54->54.  Urinalysis was positive for large leukocytes no bacteria seen and greater than 50 WBCs.  COVID-19 screening pending.  Chest x-ray showing signs of mild volume overload.  Patient was given 40 mg of Lasix IV and 1 g of Rocephin for suspected urinary tract infection.  TRH called to admit  Assessment & Plan:   Principal Problem:   Acute on chronic diastolic congestive heart  failure (HCC) Active Problems:   IDDM (insulin dependent diabetes mellitus) (HCC)   Morbid obesity, + sleep apnea   Acute lower UTI   Long term current use of anticoagulant therapy   Volume depletion  Acute on chronic diastolic congestive heart failure:  Patient presenting with generalized weakness shortness of breath, chest x-ray showed signs of mild volume overload. BNP 430.9.  TTE with EF 65-70% with pseudonormalization of LV consistent with diastolic dysfunction.  Normal right around 245 pounds, recent weight at her doctor's office on 12/14/2018 was 260 pounds.  Was 251 pounds in the ED. --Cardiology following, appreciate assistance --Initially started on IV furosemide for diuresis, although creatinine worsened, with decreased urine output --Cardiology believes that her symptoms may be related to her underlying pulmonary hypertension rather than volume overload --Heart failure team following, appreciate assistance --Right heart catheterization to assess hemodynamics plan for this afternoon --Pending NM cardiac amyloid scan --Multiple myeloma panel pending --Holding on further diuresis for now --Continue to monitor strict I's and O's and daily weights --Follow renal function closely  Presyncopal episode  On the night of 12/27/2018, patient fell in the bathroom with tech helping her.  Reported dizziness without LOC.  X-rays with no obvious fracture.  EP was consulted and pacemaker was interrogated which was normal.  Duplex ultrasound with minimal stenosis bilateral ICAs 1-39%. --Continue fall precautions  EColi UTI, POA  With dysuria symptoms recently.  Continue on ceftriaxone, E. coli pansensitive, plan 5d course  IDDM:  Last  hemoglobin A1c back in May was 6.3.  Blood sugar well controlled. --continue Lantus and sliding scale insulin.  Morbid obesity with + sleep apnea: Advised weight loss and lifestyle and outpatient follow-up.  Chest pain and elevated troponin:   high-sensitivity troponin 54->54.Patient complained of chest pain.    Echo with preserved EF. --Cardiology following --Continue to monitor on telemetry --Currently chest pain-free --Statin  Acute kidney injury in the setting of CKD stage III Baseline creatinine around 1.6, on admission 2.2 , trending up to 2.6 in the setting of IV Lasix use.  --Holding furosemide for now per cardiology --Cr 2.6-->2.55-->2.34-->2.25 --Continue monitor renal function daily  Atypical flutter/S/P pacemaker placement Recent admission in May for symptomatic bradycardia secondary AV block requiring pacemaker placement, interrogation done 9/22: "Battery and lead measurements are goodNo HVR or device observations. The device had already reduced V lead outputs to chronic output of 2.5V/0.54m".   --Currently in NSR --continue anticoagulant with Coumadin, pharmacy consulted for dosing/monitoring --INR 2.4 today  COPD: stable, cont  albuterol  Anxiety/depression: stable, continue Celexa  Essential hypertension: BP controlled cont hydralazineand isosorbide mononitrate  Hypothyroidism: Last TSH 1.773 on 08/29/2018. Continue levothyroxine 75 mcg p.o. daily  Hyperlipidemia: cont crestor 10 mg p.o. daily  History of recurrent PE/DVT, A flutter atypical. PAF, on chronic anticoagulation: INR therapeutic at 2.5 on admission.  Coumadin - pharmacy dosing.    DVT prophylaxis: Coumadin Code Status: Full code Family Communication: None Disposition Plan: Continue inpatient hospitalization, pending right heart catheterization, NM amyloidosis scan, further dependent on clinical course   Consultants:   Cardiology  Procedures:  Transthoracic echocardiogram 12/27/2018: IMPRESSIONS    1. Left ventricular ejection fraction, by visual estimation, is 65 to 70%. The left ventricle has normal function. Normal left ventricular size. There is moderately increased left ventricular hypertrophy.  2. Left ventricular  diastolic Doppler parameters are consistent with pseudonormalization pattern of LV diastolic filling.  3. Global right ventricle has normal systolic function.The right ventricular size is normal. No increase in right ventricular wall thickness.  4. Left atrial size was mildly dilated.  5. Right atrial size was normal.  6. Mild to moderate aortic valve annular calcification.  7. Moderate to severe mitral annular calcification.  8. The mitral valve is normal in structure. No evidence of mitral valve regurgitation. No evidence of mitral stenosis.  9. The tricuspid valve is normal in structure. Tricuspid valve regurgitation was not visualized by color flow Doppler. 10. The aortic valve is normal in structure. Aortic valve regurgitation was not visualized by color flow Doppler. Mild to moderate aortic valve sclerosis/calcification without any evidence of aortic stenosis. 11. The pulmonic valve was normal in structure. Pulmonic valve regurgitation is not visualized by color flow Doppler. 12. Normal pulmonary artery systolic pressure. 13. A pacer wire is visualized. 14. The inferior vena cava is normal in size with greater than 50% respiratory variability, suggesting right atrial pressure of 3 mmHg.  Antimicrobials:   Ceftriaxone 9/20>> plan 5-day course   Subjective: Patient seen and examined at bedside, resting comfortably in bed.  No complaints.  States heart catheterization scheduled for this afternoon at 3 PM. Denies headache, no fever/chills/night sweats, no nausea/vomiting/diarrhea, no chest pain, no palpitations, no shortness of breath, no abdominal pain, no weakness, no fatigue, no paresthesias.  No acute events overnight per nursing staff.  Objective: Vitals:   12/29/18 1327 12/29/18 2046 12/30/18 0332 12/30/18 0532  BP: 131/70 (!) 110/43 (!) 146/58   Pulse: 72 (!) 55 64   Resp: 20  18 18   Temp: 98.3 F (36.8 C) 98.1 F (36.7 C) 97.8 F (36.6 C)   TempSrc: Oral Oral Oral   SpO2:  93% 95% 94%   Weight:    116.8 kg  Height:        Intake/Output Summary (Last 24 hours) at 12/30/2018 1313 Last data filed at 12/30/2018 1018 Gross per 24 hour  Intake 933.71 ml  Output 1775 ml  Net -841.29 ml   Filed Weights   12/28/18 0400 12/29/18 0205 12/30/18 0532  Weight: 115.9 kg 116.7 kg 116.8 kg    Examination:  General exam: Appears calm and comfortable  Respiratory system: Clear to auscultation. Respiratory effort normal. Cardiovascular system: S1 & S2 heard, RRR. No JVD, murmurs, rubs, gallops or clicks. No pedal edema. Gastrointestinal system: Abdomen is nondistended, soft and nontender. No organomegaly or masses felt. Normal bowel sounds heard. Central nervous system: Alert and oriented. No focal neurological deficits. Extremities: Symmetric 5 x 5 power. Skin: Chronic hyperpigmentation noted bilateral lower extremities Psychiatry: Judgement and insight appear normal. Mood & affect appropriate.     Data Reviewed: I have personally reviewed following labs and imaging studies  CBC: Recent Labs  Lab 12/26/18 1212 12/27/18 0539  WBC 6.0 6.1  NEUTROABS 4.1 3.8  HGB 13.1 13.2  HCT 42.3 41.3  MCV 102.7* 101.2*  PLT 163 485   Basic Metabolic Panel: Recent Labs  Lab 12/26/18 1212 12/27/18 0539 12/28/18 0447 12/29/18 0429 12/30/18 0418  NA 139 140 138 136 138  K 4.7 4.5 4.3 3.7 3.7  CL 99 97* 99 99 101  CO2 _0 GLUCOSE 100* 93 102* 96 93  BUN 51* 55* 58* 58* 59*  CREATININE 2.23* 2.62* 2.55* 2.34* 2.25*  CALCIUM 10.4* 9.7 8.9 8.8* 8.8*   GFR: Estimated Creatinine Clearance: 31.4 mL/min (A) (by C-G formula based on SCr of 2.25 mg/dL (H)). Liver Function Tests: No results for input(s): AST, ALT, ALKPHOS, BILITOT, PROT, ALBUMIN in the last 168 hours. No results for input(s): LIPASE, AMYLASE in the last 168 hours. No results for input(s): AMMONIA in the last 168 hours. Coagulation Profile: Recent Labs  Lab 12/26/18 1212 12/27/18 0539  12/28/18 0447 12/29/18 0429 12/30/18 0418  INR 2.5* 2.5* 2.5* 2.5* 2.4*   Cardiac Enzymes: No results for input(s): CKTOTAL, CKMB, CKMBINDEX, TROPONINI in the last 168 hours. BNP (last 3 results) No results for input(s): PROBNP in the last 8760 hours. HbA1C: No results for input(s): HGBA1C in the last 72 hours. CBG: Recent Labs  Lab 12/29/18 0617 12/29/18 1118 12/29/18 1618 12/29/18 2100 12/30/18 0605  GLUCAP 85 127* 114* 117* 88   Lipid Profile: No results for input(s): CHOL, HDL, LDLCALC, TRIG, CHOLHDL, LDLDIRECT in the last 72 hours. Thyroid Function Tests: No results for input(s): TSH, T4TOTAL, FREET4, T3FREE, THYROIDAB in the last 72 hours. Anemia Panel: No results for input(s): VITAMINB12, FOLATE, FERRITIN, TIBC, IRON, RETICCTPCT in the last 72 hours. Sepsis Labs: No results for input(s): PROCALCITON, LATICACIDVEN in the last 168 hours.  Recent Results (from the past 240 hour(s))  Urine culture     Status: Abnormal   Collection Time: 12/26/18  1:43 PM   Specimen: Urine, Random  Result Value Ref Range Status   Specimen Description URINE, RANDOM  Final   Special Requests   Final    NONE Performed at [facility], [address] [place], [place] [address]    Culture >=100,000 COLONIES/mL ESCHERICHIA COLI (A)  Final  Report Status 12/28/2018 FINAL  Final   Organism ID, Bacteria ESCHERICHIA COLI (A)  Final      Susceptibility   Escherichia coli - MIC*    AMPICILLIN <=2 SENSITIVE Sensitive     CEFAZOLIN <=4 SENSITIVE Sensitive     CEFTRIAXONE <=1 SENSITIVE Sensitive     CIPROFLOXACIN <=0.25 SENSITIVE Sensitive     GENTAMICIN <=1 SENSITIVE Sensitive     IMIPENEM <=0.25 SENSITIVE Sensitive     NITROFURANTOIN <=16 SENSITIVE Sensitive     TRIMETH/SULFA <=20 SENSITIVE Sensitive     AMPICILLIN/SULBACTAM <=2 SENSITIVE Sensitive     PIP/TAZO <=4 SENSITIVE Sensitive     Extended ESBL NEGATIVE Sensitive     * >=100,000 COLONIES/mL ESCHERICHIA COLI  SARS  CORONAVIRUS 2 (TAT 6-24 HRS) Nasopharyngeal Nasopharyngeal Swab     Status: None   Collection Time: 12/26/18  2:00 PM   Specimen: Nasopharyngeal Swab  Result Value Ref Range Status   SARS Coronavirus 2 NEGATIVE NEGATIVE Final    Comment: (NOTE) SARS-CoV-2 target nucleic acids are NOT DETECTED. The SARS-CoV-2 RNA is generally detectable in upper and lower respiratory specimens during the acute phase of infection. Negative results do not preclude SARS-CoV-2 infection, do not rule out co-infections with other pathogens, and should not be used as the sole basis for treatment or other patient management decisions. Negative results must be combined with clinical observations, patient history, and epidemiological information. The expected result is Negative. Fact Sheet for Patients: SugarRoll.be Fact Sheet for Healthcare Providers: https://www.woods-mathews.com/ This test is not yet approved or cleared by the [place] FDA and  has been authorized for detection and/or diagnosis of SARS-CoV-2 by FDA under an Emergency Use Authorization (EUA). This EUA will remain  in effect (meaning this test can be used) for the duration of the COVID-19 declaration under Section 56 4(b)(1) of the Act, 21 U.S.C. section 360bbb-3(b)(1), unless the authorization is terminated or revoked sooner. Performed at [facility], [place] [address], [place], [place] [address]          Radiology Studies: Vas US Carotid  Result Date: 12/29/2018 Carotid Arterial Duplex Study Indications:   Syncope. Risk Factors:  Hypertension, Diabetes. Other Factors: History of pulmonary embolism. Performing Technologist: Darlina Sicilian RDCS  Examination Guidelines: A complete evaluation includes B-mode imaging, spectral Doppler, color Doppler, and power Doppler as needed of all accessible portions of each vessel. Bilateral testing is considered an integral part of a complete  examination. Limited examinations for reoccurring indications may be performed as noted.  Right Carotid Findings: +----------+--------+--------+--------+-------------------------+--------+           PSV cm/sEDV cm/sStenosisPlaque Description       Comments +----------+--------+--------+--------+-------------------------+--------+ CCA Prox  98      2                                                 +----------+--------+--------+--------+-------------------------+--------+ CCA Distal42      8                                                 +----------+--------+--------+--------+-------------------------+--------+ ICA Prox  53      15      1-39%   hyperechoic and irregular         +----------+--------+--------+--------+-------------------------+--------+  ICA Mid   62      19                                                +----------+--------+--------+--------+-------------------------+--------+ ICA Distal54      15                                                +----------+--------+--------+--------+-------------------------+--------+ ECA       92      91                                                +----------+--------+--------+--------+-------------------------+--------+ +----------+--------+-------+--------+-------------------+           PSV cm/sEDV cmsDescribeArm Pressure (mmHG) +----------+--------+-------+--------+-------------------+ [id number]                                         +----------+--------+-------+--------+-------------------+ +---------+--------+--+--------+-+ VertebralPSV cm/s51EDV cm/s9 +---------+--------+--+--------+-+  Left Carotid Findings: +----------+--------+--------+--------+-------------------------+--------+           PSV cm/sEDV cm/sStenosisPlaque Description       Comments +----------+--------+--------+--------+-------------------------+--------+ CCA Prox  80      6                                                  +----------+--------+--------+--------+-------------------------+--------+ CCA Distal51      8                                                 +----------+--------+--------+--------+-------------------------+--------+ ICA Prox  65      20      1-39%   hyperechoic and irregular         +----------+--------+--------+--------+-------------------------+--------+ ICA Mid   58      17                                                +----------+--------+--------+--------+-------------------------+--------+ ICA Distal41      12                                                +----------+--------+--------+--------+-------------------------+--------+ ECA       96      2                                                 +----------+--------+--------+--------+-------------------------+--------+ +----------+--------+--------+--------+-------------------+  PSV cm/sEDV cm/sDescribeArm Pressure (mmHG) +----------+--------+--------+--------+-------------------+ Subclavian121                                         +----------+--------+--------+--------+-------------------+ +---------+--------+--+--------+--+ VertebralPSV cm/s68EDV cm/s11 +---------+--------+--+--------+--+     Preliminary         Scheduled Meds: . citalopram  20 mg Oral QHS  . ferrous sulfate  325 mg Oral Daily  . hydrALAZINE  25 mg Oral TID  . insulin glargine  10 Units Subcutaneous Daily  . isosorbide mononitrate  60 mg Oral Daily  . levothyroxine  75 mcg Oral QAC breakfast  . Melatonin  9 mg Oral QHS  . rosuvastatin  10 mg Oral QHS  . sodium chloride flush  3 mL Intravenous Q12H  . sodium chloride flush  3 mL Intravenous Q12H  . warfarin  7.5 mg Oral ONCE-1800  . Warfarin - Pharmacist Dosing Inpatient   Does not apply q1800   Continuous Infusions: . sodium chloride    . sodium chloride    . [START ON 12/31/2018] sodium chloride 10 mL/hr at 12/30/18 0844  . cefTRIAXone  (ROCEPHIN)  IV Stopped (12/29/18 1900)     LOS: 4 days    Time spent: 34 minutes spent on chart review, discussion with nursing staff, consultants, person reviewing all imaging studies and labs, updating family and interview/physical exam; more than 50% of that time was spent in counseling and/or coordination of care.    Bartley Vuolo J [place] DO [facility] Pager [phone]  If 7PM-7AM, please contact night-coverage [email or web address] Password [email or web address] 12/30/2018, 1:13 PM

## 2018-12-30 NOTE — Progress Notes (Addendum)
Advanced Heart Failure Rounding Note  PCP-Cardiologist: No primary care provider on file.   Subjective:    Myeloma panel pending. Yesterday diuretics held. Creatinine 2.3 today.   Denies SOB, orthopnea or PND.   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   Vital Signs:   Temp:  [97.8 F (36.6 C)-98.3 F (36.8 C)] 97.8 F (36.6 C) (09/24 0332) Pulse Rate:  [51-72] 64 (09/24 0332) Resp:  [18-20] 18 (09/24 0332) BP: (110-146)/(43-77) 146/58 (09/24 0332) SpO2:  [93 %-95 %] 94 % (09/24 0332) Weight:  [116.8 kg] 116.8 kg (09/24 0532) Last BM Date: 12/27/18  Weight change: Filed Weights   12/28/18 0400 12/29/18 0205 12/30/18 0532  Weight: 115.9 kg 116.7 kg 116.8 kg    Intake/Output:   Intake/Output Summary (Last 24 hours) at 12/30/2018 0850 Last data filed at 12/30/2018 0824 Gross per 24 hour  Intake 1416.71 ml  Output 2300 ml  Net -883.29 ml      Physical Exam    General:  Appears chronically ill.  No resp difficulty HEENT: Normal Neck: Supple. JVP 6-7  Carotids 2+ bilat; no bruits. No lymphadenopathy or thyromegaly appreciated. Cor: PMI nondisplaced. Regular rate & rhythm. No rubs, gallops or murmurs. Lungs: Clear Abdomen: obese Soft, nontender, nondistended. No hepatosplenomegaly. No bruits or masses. Good bowel sounds. Extremities: No cyanosis, clubbing, rash, R and LLE trace edema. With chronic hyperpigmentation.  Neuro: Alert & orientedx3, cranial nerves grossly intact. moves all 4 extremities w/o difficulty. Affect pleasant   Telemetry  Sinus Loletha Grayer 50s  Personally reviewed    EKG    N/A  Labs    CBC No results for input(s): WBC, NEUTROABS, HGB, HCT, MCV, PLT in the last 72 hours. Basic Metabolic Panel Recent Labs    12/29/18 0429 12/30/18 0418  NA 136 138  K 3.7 3.7  CL 99 101  CO2 30 26  GLUCOSE 96 93  BUN 58* 59*  CREATININE 2.34* 2.25*  CALCIUM 8.8* 8.8*   Liver  Function Tests No results for input(s): AST, ALT, ALKPHOS, BILITOT, PROT, ALBUMIN in the last 72 hours. No results for input(s): LIPASE, AMYLASE in the last 72 hours. Cardiac Enzymes No results for input(s): CKTOTAL, CKMB, CKMBINDEX, TROPONINI in the last 72 hours.  BNP: BNP (last 3 results) Recent Labs    12/26/18 1212  BNP 430.9*    ProBNP (last 3 results) No results for input(s): PROBNP in the last 8760 hours.   D-Dimer No results for input(s): DDIMER in the last 72 hours. Hemoglobin A1C No results for input(s): HGBA1C in the last 72 hours. Fasting Lipid Panel No results for input(s): CHOL, HDL, LDLCALC, TRIG, CHOLHDL, LDLDIRECT in the last 72 hours. Thyroid Function Tests No results for input(s): TSH, T4TOTAL, T3FREE, THYROIDAB in the last 72 hours.  Invalid input(s): FREET3  Other results:   Imaging    Vas US Carotid  Result Date: 12/29/2018 Carotid Arterial Duplex Study Indications:   Syncope. Risk Factors:  Hypertension, Diabetes. Other Factors: History of pulmonary embolism. Performing Technologist: Darlina Sicilian RDCS  Examination Guidelines: A complete evaluation includes B-mode imaging, spectral Doppler, color Doppler, and power Doppler as needed of all accessible portions of each vessel. Bilateral testing is considered an integral part of a complete examination. Limited examinations for reoccurring indications may be performed as noted.  Right Carotid Findings: +----------+--------+--------+--------+-------------------------+--------+  PSV cm/s EDV cm/s Stenosis Plaque Description        Comments  +----------+--------+--------+--------+-------------------------+--------+  CCA Prox   98       2                                                     +----------+--------+--------+--------+-------------------------+--------+  CCA Distal 42       8                                                      +----------+--------+--------+--------+-------------------------+--------+  ICA Prox   53       15       1-39%    hyperechoic and irregular           +----------+--------+--------+--------+-------------------------+--------+  ICA Mid    62       19                                                    +----------+--------+--------+--------+-------------------------+--------+  ICA Distal 54       15                                                    +----------+--------+--------+--------+-------------------------+--------+  ECA        92       91                                                    +----------+--------+--------+--------+-------------------------+--------+ +----------+--------+-------+--------+-------------------+             PSV cm/s EDV cms Describe Arm Pressure (mmHG)  +----------+--------+-------+--------+-------------------+  Subclavian 85                                             +----------+--------+-------+--------+-------------------+ +---------+--------+--+--------+-+  Vertebral PSV cm/s 51 EDV cm/s 9  +---------+--------+--+--------+-+  Left Carotid Findings: +----------+--------+--------+--------+-------------------------+--------+             PSV cm/s EDV cm/s Stenosis Plaque Description        Comments  +----------+--------+--------+--------+-------------------------+--------+  CCA Prox   80       6                                                     +----------+--------+--------+--------+-------------------------+--------+  CCA Distal 51       8                                                     +----------+--------+--------+--------+-------------------------+--------+  ICA Prox   65       20       1-39%    hyperechoic and irregular           +----------+--------+--------+--------+-------------------------+--------+  ICA Mid    58       17                                                    +----------+--------+--------+--------+-------------------------+--------+  ICA Distal 41       12                                                     +----------+--------+--------+--------+-------------------------+--------+  ECA        96       2                                                     +----------+--------+--------+--------+-------------------------+--------+ +----------+--------+--------+--------+-------------------+             PSV cm/s EDV cm/s Describe Arm Pressure (mmHG)  +----------+--------+--------+--------+-------------------+  Subclavian 121                                             +----------+--------+--------+--------+-------------------+ +---------+--------+--+--------+--+  Vertebral PSV cm/s 68 EDV cm/s 11  +---------+--------+--+--------+--+     Preliminary       Medications:     Scheduled Medications:  citalopram  20 mg Oral QHS   ferrous sulfate  325 mg Oral Daily   hydrALAZINE  25 mg Oral TID   insulin glargine  10 Units Subcutaneous Daily   isosorbide mononitrate  60 mg Oral Daily   levothyroxine  75 mcg Oral QAC breakfast   Melatonin  9 mg Oral QHS   rosuvastatin  10 mg Oral QHS   sodium chloride flush  3 mL Intravenous Q12H   sodium chloride flush  3 mL Intravenous Q12H     Infusions:  sodium chloride     sodium chloride     [START ON 12/31/2018] sodium chloride 10 mL/hr at 12/30/18 0844   cefTRIAXone (ROCEPHIN)  IV Stopped (12/29/18 1900)     PRN Medications:  sodium chloride, sodium chloride, acetaminophen, HYDROcodone-acetaminophen, insulin aspart, ondansetron (ZOFRAN) IV, sodium chloride flush, sodium chloride flush    Assessment/Plan   1. A/C Diastolic Heart Failure  ECHO 12/2017 EF 65-70% , moderately increased LVH, Diuresed with IV lasix. Appears euvolemic. At home she was taking torsemide 40 mg daily.  - RHC today.  Adjust diuretics post cath.  -Myeloma panel pending.  - PYP scan ordered for possible amyloid.  - Will need sleep study set up down the road.   2. CKD  On admit creatinine 2.3. Todays creatinine 2.25  .  Baseline creatinine is < 1.6-1.8   3, UTI On cefitriaxone.   4. H/O CHB with MDT PPM-  EP reprogrammed this admit to VVI 50   5. Marland Kitchen DMII On insulin.  6. . Hypothyroidism  On levothyroxine  7. H/O PEs, On coumadin.    Length of Stay: 4  Amy Clegg, NP  12/30/2018, 8:50 AM  Advanced Heart Failure Team Pager [phone] (M-F; 7a - 4p)  Please contact [facility] for night-coverage after hours (4p -7a ) and weekends on [email or web address]  Patient seen and examined with the above-signed Advanced Practice Provider and/or Housestaff. I personally reviewed laboratory data, imaging studies and relevant notes. I independently examined the patient and formulated the important aspects of the plan. I have edited the note to reflect any of my changes or salient points. I have personally discussed the plan with the patient and/or family.  Volume status looks stable Creatinine slightly improved. PYP not convincing for TTR amyloid. Can repeat in 6 months. Await myeloma panel. Will proceed with RHC today.   Glori Bickers, MD  4:18 PM

## 2018-12-30 NOTE — H&P (View-Only) (Signed)
Advanced Heart Failure Rounding Note  PCP-Cardiologist: No primary care provider on file.   Subjective:    Myeloma panel pending. Yesterday diuretics held. Creatinine 2.3 today.   Denies SOB, orthopnea or PND.   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   Vital Signs:   Temp:  [97.8 F (36.6 C)-98.3 F (36.8 C)] 97.8 F (36.6 C) (09/24 0332) Pulse Rate:  [51-72] 64 (09/24 0332) Resp:  [18-20] 18 (09/24 0332) BP: (110-146)/(43-77) 146/58 (09/24 0332) SpO2:  [93 %-95 %] 94 % (09/24 0332) Weight:  [116.8 kg] 116.8 kg (09/24 0532) Last BM Date: 12/27/18  Weight change: Filed Weights   12/28/18 0400 12/29/18 0205 12/30/18 0532  Weight: 115.9 kg 116.7 kg 116.8 kg    Intake/Output:   Intake/Output Summary (Last 24 hours) at 12/30/2018 0850 Last data filed at 12/30/2018 0824 Gross per 24 hour  Intake 1416.71 ml  Output 2300 ml  Net -883.29 ml      Physical Exam    General:  Appears chronically ill.  No resp difficulty HEENT: Normal Neck: Supple. JVP 6-7  Carotids 2+ bilat; no bruits. No lymphadenopathy or thyromegaly appreciated. Cor: PMI nondisplaced. Regular rate & rhythm. No rubs, gallops or murmurs. Lungs: Clear Abdomen: obese Soft, nontender, nondistended. No hepatosplenomegaly. No bruits or masses. Good bowel sounds. Extremities: No cyanosis, clubbing, rash, R and LLE trace edema. With chronic hyperpigmentation.  Neuro: Alert & orientedx3, cranial nerves grossly intact. moves all 4 extremities w/o difficulty. Affect pleasant   Telemetry  Sinus Loletha Grayer 50s  Personally reviewed    EKG    N/A  Labs    CBC No results for input(s): WBC, NEUTROABS, HGB, HCT, MCV, PLT in the last 72 hours. Basic Metabolic Panel Recent Labs    12/29/18 0429 12/30/18 0418  NA 136 138  K 3.7 3.7  CL 99 101  CO2 30 26  GLUCOSE 96 93  BUN 58* 59*  CREATININE 2.34* 2.25*  CALCIUM 8.8* 8.8*   Liver  Function Tests No results for input(s): AST, ALT, ALKPHOS, BILITOT, PROT, ALBUMIN in the last 72 hours. No results for input(s): LIPASE, AMYLASE in the last 72 hours. Cardiac Enzymes No results for input(s): CKTOTAL, CKMB, CKMBINDEX, TROPONINI in the last 72 hours.  BNP: BNP (last 3 results) Recent Labs    12/26/18 1212  BNP 430.9*    ProBNP (last 3 results) No results for input(s): PROBNP in the last 8760 hours.   D-Dimer No results for input(s): DDIMER in the last 72 hours. Hemoglobin A1C No results for input(s): HGBA1C in the last 72 hours. Fasting Lipid Panel No results for input(s): CHOL, HDL, LDLCALC, TRIG, CHOLHDL, LDLDIRECT in the last 72 hours. Thyroid Function Tests No results for input(s): TSH, T4TOTAL, T3FREE, THYROIDAB in the last 72 hours.  Invalid input(s): FREET3  Other results:   Imaging    Vas US Carotid  Result Date: 12/29/2018 Carotid Arterial Duplex Study Indications:   Syncope. Risk Factors:  Hypertension, Diabetes. Other Factors: History of pulmonary embolism. Performing Technologist: Darlina Sicilian RDCS  Examination Guidelines: A complete evaluation includes B-mode imaging, spectral Doppler, color Doppler, and power Doppler as needed of all accessible portions of each vessel. Bilateral testing is considered an integral part of a complete examination. Limited examinations for reoccurring indications may be performed as noted.  Right Carotid Findings: +----------+--------+--------+--------+-------------------------+--------+  PSV cm/s EDV cm/s Stenosis Plaque Description        Comments  +----------+--------+--------+--------+-------------------------+--------+  CCA Prox   98       2                                                     +----------+--------+--------+--------+-------------------------+--------+  CCA Distal 42       8                                                      +----------+--------+--------+--------+-------------------------+--------+  ICA Prox   53       15       1-39%    hyperechoic and irregular           +----------+--------+--------+--------+-------------------------+--------+  ICA Mid    62       19                                                    +----------+--------+--------+--------+-------------------------+--------+  ICA Distal 54       15                                                    +----------+--------+--------+--------+-------------------------+--------+  ECA        92       91                                                    +----------+--------+--------+--------+-------------------------+--------+ +----------+--------+-------+--------+-------------------+             PSV cm/s EDV cms Describe Arm Pressure (mmHG)  +----------+--------+-------+--------+-------------------+  Subclavian 85                                             +----------+--------+-------+--------+-------------------+ +---------+--------+--+--------+-+  Vertebral PSV cm/s 51 EDV cm/s 9  +---------+--------+--+--------+-+  Left Carotid Findings: +----------+--------+--------+--------+-------------------------+--------+             PSV cm/s EDV cm/s Stenosis Plaque Description        Comments  +----------+--------+--------+--------+-------------------------+--------+  CCA Prox   80       6                                                     +----------+--------+--------+--------+-------------------------+--------+  CCA Distal 51       8                                                     +----------+--------+--------+--------+-------------------------+--------+  ICA Prox   65       20       1-39%    hyperechoic and irregular           +----------+--------+--------+--------+-------------------------+--------+  ICA Mid    58       17                                                    +----------+--------+--------+--------+-------------------------+--------+  ICA Distal 41       12                                                     +----------+--------+--------+--------+-------------------------+--------+  ECA        96       2                                                     +----------+--------+--------+--------+-------------------------+--------+ +----------+--------+--------+--------+-------------------+             PSV cm/s EDV cm/s Describe Arm Pressure (mmHG)  +----------+--------+--------+--------+-------------------+  Subclavian 121                                             +----------+--------+--------+--------+-------------------+ +---------+--------+--+--------+--+  Vertebral PSV cm/s 68 EDV cm/s 11  +---------+--------+--+--------+--+     Preliminary       Medications:     Scheduled Medications:  citalopram  20 mg Oral QHS   ferrous sulfate  325 mg Oral Daily   hydrALAZINE  25 mg Oral TID   insulin glargine  10 Units Subcutaneous Daily   isosorbide mononitrate  60 mg Oral Daily   levothyroxine  75 mcg Oral QAC breakfast   Melatonin  9 mg Oral QHS   rosuvastatin  10 mg Oral QHS   sodium chloride flush  3 mL Intravenous Q12H   sodium chloride flush  3 mL Intravenous Q12H     Infusions:  sodium chloride     sodium chloride     [START ON 12/31/2018] sodium chloride 10 mL/hr at 12/30/18 0844   cefTRIAXone (ROCEPHIN)  IV Stopped (12/29/18 1900)     PRN Medications:  sodium chloride, sodium chloride, acetaminophen, HYDROcodone-acetaminophen, insulin aspart, ondansetron (ZOFRAN) IV, sodium chloride flush, sodium chloride flush    Assessment/Plan   1. A/C Diastolic Heart Failure  ECHO 12/2017 EF 65-70% , moderately increased LVH, Diuresed with IV lasix. Appears euvolemic. At home she was taking torsemide 40 mg daily.  - RHC today.  Adjust diuretics post cath.  -Myeloma panel pending.  - PYP scan ordered for possible amyloid.  - Will need sleep study set up down the road.   2. CKD  On admit creatinine 2.3. Todays creatinine 2.25  .  Baseline creatinine is < 1.6-1.8   3, UTI On cefitriaxone.   4. H/O CHB with MDT PPM-  EP reprogrammed this admit to VVI 50   5. Marland Kitchen DMII On insulin.  6. . Hypothyroidism  On levothyroxine  7. H/O PEs, On coumadin.    Length of Stay: 4  Amy Clegg, NP  12/30/2018, 8:50 AM  Advanced Heart Failure Team Pager [phone] (M-F; 7a - 4p)  Please contact [facility] for night-coverage after hours (4p -7a ) and weekends on [email or web address]  Patient seen and examined with the above-signed Advanced Practice Provider and/or Housestaff. I personally reviewed laboratory data, imaging studies and relevant notes. I independently examined the patient and formulated the important aspects of the plan. I have edited the note to reflect any of my changes or salient points. I have personally discussed the plan with the patient and/or family.  Volume status looks stable Creatinine slightly improved. PYP not convincing for TTR amyloid. Can repeat in 6 months. Await myeloma panel. Will proceed with RHC today.   Glori Bickers, MD  4:18 PM

## 2018-12-31 LAB — BASIC METABOLIC PANEL
Anion gap: 9 (ref 5–15)
BUN: 51 mg/dL — ABNORMAL HIGH (ref 8–23)
CO2: 27 mmol/L (ref 22–32)
Calcium: 8.8 mg/dL — ABNORMAL LOW (ref 8.9–10.3)
Chloride: 103 mmol/L (ref 98–111)
Creatinine, Ser: 1.86 mg/dL — ABNORMAL HIGH (ref 0.44–1.00)
GFR calc Af Amer: 33 mL/min — ABNORMAL LOW (ref >60)
GFR calc non Af Amer: 28 mL/min — ABNORMAL LOW (ref >60)
Glucose, Bld: 92 mg/dL (ref 70–99)
Potassium: 3.6 mmol/L (ref 3.5–5.1)
Sodium: 139 mmol/L (ref 135–145)

## 2018-12-31 LAB — MULTIPLE MYELOMA PANEL, SERUM
Albumin SerPl Elph-Mcnc: 3.4 g/dL (ref 2.9–4.4)
Albumin/Glob SerPl: 1.4 (ref 0.7–1.7)
Alpha 1: 0.2 g/dL (ref 0.0–0.4)
Alpha2 Glob SerPl Elph-Mcnc: 0.8 g/dL (ref 0.4–1.0)
B-Globulin SerPl Elph-Mcnc: 0.9 g/dL (ref 0.7–1.3)
Gamma Glob SerPl Elph-Mcnc: 0.7 g/dL (ref 0.4–1.8)
Globulin, Total: 2.6 g/dL (ref 2.2–3.9)
IgA: 233 mg/dL (ref 87–352)
IgG (Immunoglobin G), Serum: 798 mg/dL (ref 586–1602)
IgM (Immunoglobulin M), Srm: 45 mg/dL (ref 26–217)
Total Protein ELP: 6 g/dL (ref 6.0–8.5)

## 2018-12-31 LAB — GLUCOSE, CAPILLARY
Glucose-Capillary: 75 mg/dL (ref 70–99)
Glucose-Capillary: 102 mg/dL — ABNORMAL HIGH (ref 70–99)

## 2018-12-31 LAB — PROTIME-INR
INR: 2.3 — ABNORMAL HIGH (ref 0.8–1.2)
Prothrombin Time: 24.6 seconds — ABNORMAL HIGH (ref 11.4–15.2)

## 2018-12-31 SURGERY — RIGHT/LEFT HEART CATH AND CORONARY ANGIOGRAPHY
Anesthesia: LOCAL

## 2018-12-31 MED ORDER — TORSEMIDE 20 MG PO TABS: 20.0000 mg | ORAL_TABLET | Freq: Every day | ORAL | 0 refills | Status: DC

## 2018-12-31 MED ORDER — ISOSORBIDE MONONITRATE ER 60 MG PO TB24: 60.0000 mg | ORAL_TABLET | Freq: Every day | ORAL | 0 refills | Status: DC

## 2018-12-31 NOTE — Discharge Summary (Signed)
Physician Discharge Summary  [name] JTT:[id number] DOB: 1955-12-02 DOA: 12/26/2018  PCP: Leah Panda, MD  Admit date: 12/26/2018 Discharge date: 12/31/2018  Admitted From: Home Disposition:  Home  Recommendations for Outpatient Follow-up:  1. Follow up with PCP as scheduled on 01/10/2019 at 2 PM 2. Follow-up with heart failure team as scheduled on 01/11/2019 at 1:30 PM 3. Recommend outpatient sleep study 4. Will need repeat PYP in 6 months 5. Please obtain BMP in one week 6. Decreased Imdur to 60 mg p.o. daily 7. Decrease torsemide to 20 mg p.o. daily, started on 01/01/2019 8. Discontinued Spironolactone 9. Please follow up on the following pending results: Multiple myeloma panel  Home Health: No Equipment/Devices: none  Discharge Condition: Stable CODE STATUS: Full code Diet recommendation: Heart Healthy / Carb Modified   History of present illness:  [name] a 63 y.o.femalewith medical historyHTN, HLD, DM, CHF last EF 50-55%, A. fib on Coumadin, complete heart blocks/p PM,historyofDVT/PE,andhypothyroidism;who presents withcomplaints of progressively worsening dry cough, shortness of breath, and chest pain. She reports having dry cough and shortness of breath progressively worsening over the last week. Associated symptoms include chest heaviness, fatigue, rhinorrhea,poor appetite, nausea, change in taste, dysuria couple days ago, and dizziness. She had recently went to a cookout on Labor Day weekend with a bunch of people where she and or the day where mask. Denies having any significant fever, vomiting, leg swelling or diarrhea. Of note patient did fall a week and a half ago and bruised her right side. Patient required pacemaker placement back in May of this year by Dr. Rayann Heman. She was recently scheduled an in office visit because she was told that her pacemaker needed reprogramming. Today when she woke up she just did not feel right and reported  that it took her a very long time to even put her clothes.At some point she felt as though she could not catch her breath told her husband to call EMS.  Upon admission into the emergency department patient was seen to be afebrile and hemodynamically stable. Labs significant for BUN 51, creatinine 2.3, calcium 10.4, BNP 430.9,INR 2.5,andhigh-sensitivity troponin54->54.Urinalysis was positive for large leukocytes no bacteria seen and greater than 50 WBCs. COVID-19 screening pending.Chest x-ray showing signs of mild volume overload.Patient was given 40 mg of Lasix IV and 1 g of Rocephin for suspected urinary tract infection. [facility] called to admit  Hospital course:  Acute on chronic diastolic congestive heart failure:  Patient presenting with generalized weakness shortness of breath, chest x-ray showed signs of mild volume overload. BNP 430.9.  TTE with EF 65-70% with pseudonormalization of LV consistent with diastolic dysfunction.  Normal right around 245 pounds, recent weight at her doctor's office on 12/14/2018 was 260 pounds.  Was 251 pounds in the ED. cardiology was consulted and followed during hospital course.  She was initially started on IV furosemide for diuresis, although with worsening creatinine with decreased urinary output.  Cardiology thought that her symptoms were likely related to her underlying pulmonary hypertension rather than volume overload.  She underwent right heart catheterization with findings of compensated volume status with minimal pulmonary hypertension and high cardiac output.  Nuc med amyloid scan with findings not suggestive of amyloidosis.  With holding further diuresis, patient's creatinine improved and was 1.86 at time of discharge with a baseline between 1.6-1.8.  Cardiology decrease patient's Imdur to 60 mg p.o. daily and decrease torsemide to 20 mg p.o. daily to start on 01/01/2019.  Patient has follow-up scheduled with  the heart failure clinic on  01/11/2019.  Presyncopal episode On the night of 12/27/2018, patient fell in the bathroom with tech helping her.  Reported dizziness without LOC.  X-rays with no obvious fracture.  EP was consulted and pacemaker was interrogated which was normal.  Duplex ultrasound with minimal stenosis bilateral ICAs 1-39%.  And started hospitalization.  EColiUTI, POA  With dysuria symptoms recently.Continue on ceftriaxone, E. coli pansensitive.  Completed 5-day course of ceftriaxone.  IDDM: Last hemoglobin A1c back in May was 6.3. Blood sugar well controlled. Continue home Lantus and sliding scale insulin.  Morbid obesity with + sleep apnea: Advised weight loss and lifestyle and outpatient follow-up.  Will need outpatient sleep study.  Chest pain and elevated troponin:  high-sensitivity troponin 54->54.Patient complained of chest pain.   Echo with preserved EF.  No further chest pain during hospitalization.  Acute kidney injury in the setting of CKD stage III Baseline creatinine around 1.6, on admission 2.2 , trending up to 2.6 in the setting of IV Lasix use.  Further diuresis was held while inpatient.  Patient's creatinine improved to 1.86 at time of discharge.  Will restart torsemide on 12/12/2018 and discontinue Spironolactone per cardiology recommendations.  Recommend repeat BMP in 1 week.  Atypical flutter/S/Ppacemaker placement Recent admission in May for symptomatic bradycardia secondary AV block requiring pacemaker placement,interrogation done 9/22: "Battery and lead measurements are goodNo HVR or device observations.The device had already reduced V lead outputs to chronic output of 2.5V/0.39m".Currently in normal sinus rhythm.  Continue anticoagulation with Coumadin, INR at time of discharge 2.3.  COPD:stable, cont albuterol  Anxiety/depression: stable, continue Celexa  Essential hypertension: BP controlled cont hydralazineand isosorbide mononitrate  Hypothyroidism:  Last TSH 1.773 on 08/29/2018. Continue levothyroxine 75 mcg p.o. daily  Hyperlipidemia: continue crestor 10 mg p.o. daily  History of recurrent PE/DVT,A flutter atypical. PAF, on chronic anticoagulation: INR therapeutic, 2.3 at time of discharge.  Continue home Coumadin dose  Discharge Diagnoses:  Principal Problem:   Acute on chronic diastolic congestive heart failure (HCC) Active Problems:   IDDM (insulin dependent diabetes mellitus) (HCC)   Morbid obesity, + sleep apnea   Long term current use of anticoagulant therapy   Volume depletion    Discharge Instructions  Discharge Instructions    (HEART FAILURE PATIENTS) Call MD:  Anytime you have any of the following symptoms: 1) 3 pound weight gain in 24 hours or 5 pounds in 1 week 2) shortness of breath, with or without a dry hacking cough 3) swelling in the hands, feet or stomach 4) if you have to sleep on extra pillows at night in order to breathe.   Complete by: As directed    Call MD for:  difficulty breathing, headache or visual disturbances   Complete by: As directed    Call MD for:  extreme fatigue   Complete by: As directed    Call MD for:  persistant dizziness or light-headedness   Complete by: As directed    Call MD for:  persistant nausea and vomiting   Complete by: As directed    Call MD for:  severe uncontrolled pain   Complete by: As directed    Call MD for:  temperature >100.4   Complete by: As directed    Diet - low sodium heart healthy   Complete by: As directed    Increase activity slowly   Complete by: As directed      Allergies as of 12/31/2018      Reactions  Morphine And Related Rash      Medication List    STOP taking these medications   spironolactone 25 MG tablet Commonly known as: ALDACTONE     TAKE these medications   acetaminophen 500 MG tablet Commonly known as: TYLENOL Take 500 mg by mouth every 6 (six) hours as needed for fever or headache (pain).   allopurinol 300 MG  tablet Commonly known as: ZYLOPRIM Take 300 mg by mouth daily.   Biotin 1000 MCG tablet Take 500 mcg by mouth daily with lunch.   citalopram 20 MG tablet Commonly known as: CELEXA Take 20 mg by mouth at bedtime.   CITRACAL/VITAMIN D PO Take 2 tablets by mouth every evening.   ferrous sulfate 325 (65 FE) MG tablet Take 325 mg by mouth daily.   Fish Oil 1000 MG Caps Take 1,000 mg by mouth daily.   hydrALAZINE 25 MG tablet Commonly known as: APRESOLINE HOLD UNTIL SEEN BY YOUR PCP Morning, mid afternoon and bedtime What changed:   how much to take  how to take this  when to take this  additional instructions   insulin aspart 100 UNIT/ML injection Commonly known as: novoLOG Inject 2 Units into the skin 3 (three) times daily with meals. What changed:   how much to take  when to take this  reasons to take this   isosorbide mononitrate 60 MG 24 hr tablet Commonly known as: IMDUR Take 1 tablet (60 mg total) by mouth daily. Start taking on: January 01, 2019 What changed:   medication strength  how much to take   Levemir FlexTouch 100 UNIT/ML Pen Generic drug: Insulin Detemir Inject 20 Units into the skin daily before breakfast.   levothyroxine 75 MCG tablet Commonly known as: SYNTHROID Take 75 mcg by mouth daily before breakfast.   Melatonin 5 MG Tabs Take 10 mg by mouth at bedtime.   multivitamin with minerals Tabs tablet Take 1 tablet by mouth daily.   Ozempic (0.25 or 0.5 MG/DOSE) 2 MG/1.5ML Sopn Generic drug: Semaglutide(0.25 or 0.'5MG'$ /DOS) Inject 0.5 mg into the skin once a week.   rosuvastatin 10 MG tablet Commonly known as: CRESTOR Take 1 tablet (10 mg total) by mouth daily. What changed: when to take this   torsemide 20 MG tablet Commonly known as: DEMADEX Take 1 tablet (20 mg total) by mouth daily. Start taking on: January 01, 2019 What changed:   how much to take  when to take this  reasons to take this   warfarin 2.5 MG  tablet Commonly known as: COUMADIN Take as directed. If you are unsure how to take this medication, talk to your nurse or doctor. Original instructions: TAKE 7.5 MG ON M-W-F AND 5 MG TUES, THURS, SAT AND SUN AS DIRECTED What changed: See the new instructions.      Follow-up Information    [facility] Follow up.   Specialty: Home Health Services Why: [facility] will reach out to schedule days and times of Home Health RN services. Contact information: [address] [place] [address] [phone]        Leah Panda, MD. Go on 01/10/2019.   Specialty: Internal Medicine Why: '@2'$ :00pm Contact information: [address] [address] [address] Follow up on 01/11/2019.   Specialty: Cardiology Why: 1:30 Garage Code [id number]  Contact information: [address] [id number] XT GGYIRSWNIO [place] [phone]  Allergies  Allergen Reactions  . Morphine And Related Rash    Consultations:  Cardiology   Procedures/Studies: Dg Cervical Spine Complete  Result Date: 12/27/2018 CLINICAL DATA:  63 year old female with neck pain. EXAM: CERVICAL SPINE - COMPLETE 4+ VIEW COMPARISON:  Cervical spine radiograph dated 09/16/2018 FINDINGS: Evaluation is limited due to patient positioning and soft tissue attenuation. No definite acute fracture or subluxation identified. There is multilevel degenerative changes and osteophyte. The visualized odontoid appears intact. There is anatomic alignment the lateral masses of C1 and C2. The soft tissues are grossly unremarkable. IMPRESSION: 1. No definite acute fracture or subluxation. 2. Multilevel degenerative changes. Electronically Signed   By: Anner Crete M.D.   On: 12/27/2018 22:30   Dg Thoracic Spine W/swimmers  Result Date: 12/27/2018 CLINICAL DATA:  63 year old female with fall and back pain. EXAM: THORACIC SPINE - 3  VIEWS COMPARISON:  Chest radiograph dated 12/26/2018 and 09/01/2018 FINDINGS: Evaluation is limited due to superimposition of the soft tissues. No definite acute fracture or subluxation of the thoracic spine. Multilevel degenerative changes and osteophyte. Left pectoral pacemaker device. Soft tissues are grossly unremarkable. IMPRESSION: No definite acute fracture or subluxation of the thoracic spine. Electronically Signed   By: Anner Crete M.D.   On: 12/27/2018 23:30   Dg Ankle Complete Left  Result Date: 12/28/2018 CLINICAL DATA:  Left ankle pain after fall in bathroom. EXAM: LEFT ANKLE COMPLETE - 3+ VIEW COMPARISON:  None. FINDINGS: No acute fracture or dislocation. Ankle mortise is preserved. Mild mid and hindfoot osteoarthritis. Plantar calcaneal spur and Achilles tendon enthesophyte. Possible small ankle joint effusion. Bones appear under mineralized. Mild soft tissue edema. IMPRESSION: 1. No acute fracture or dislocation of the left ankle. 2. Mild mid and hindfoot osteoarthritis. Electronically Signed   By: Keith Rake M.D.   On: 12/28/2018 00:11   Nm Cardiac Amyloid Tumor Loc Inflam Spect 1 Day  Result Date: 12/30/2018 CLINICAL DATA:  HEART FAILURE. CONCERN FOR CARDIAC AMYLOIDOSIS. EXAM: NUCLEAR MEDICINE TUMOR LOCALIZATION. PYP CARDIAC AMYLOIDOSIS SCAN WITH SPECT TECHNIQUE: Following intravenous administration of radiopharmaceutical, anterior planar images of the chest were obtained. Regions of interest were placed on the heart and contralateral chest wall for quantitative assessment. Additional SPECT imaging of the chest was obtained. RADIOPHARMACEUTICALS:  8 mCi TECHNETIUM 99 PYROPHOSPHATE FINDINGS: Planar Visual assessment: Anterior planar imaging demonstrates radiotracer uptake within the heart less than than uptake within the adjacent ribs (Grade 1). Quantitative assessment : Quantitative assessment of the cardiac uptake compared to the contralateral chest wall is equal to 1.3 (H/CL =  1.3). SPECT assessment: SPECT imaging of the chest demonstrates minimal radiotracer accumulation within the LEFT ventricle. IMPRESSION: Visual and quantitative assessment (grade 1, H/CLL equal 1.3) are NOT suggestive of transthyretin amyloidosis. Electronically Signed   By: Suzy Bouchard M.D.   On: 12/30/2018 16:18   Dg Chest Port 1 View  Result Date: 12/26/2018 CLINICAL DATA:  Shortness of breath, chest pain with cough EXAM: PORTABLE CHEST 1 VIEW COMPARISON:  Chest x-rays dated 09/01/2018 and 05/04/2016. FINDINGS: Stable cardiomegaly. Mild central pulmonary vascular congestion and mild bilateral interstitial thickening/edema. No pleural effusion or pneumothorax is seen. Osseous structures about the chest are unremarkable. LEFT chest wall pacemaker/ICD apparatus in place. IMPRESSION: 1. Stable cardiomegaly. 2. Mild central pulmonary vascular congestion and mild bilateral interstitial thickening/edema suggesting mild volume overload/CHF. Electronically Signed   By: Franki Cabot M.D.   On: 12/26/2018 12:00   Vas US Carotid  Result Date: 12/30/2018 Carotid Arterial Duplex Study Indications:  Syncope. Risk Factors:  Hypertension, Diabetes. Other Factors: History of pulmonary embolism. Performing Technologist: Darlina Sicilian RDCS  Examination Guidelines: A complete evaluation includes B-mode imaging, spectral Doppler, color Doppler, and power Doppler as needed of all accessible portions of each vessel. Bilateral testing is considered an integral part of a complete examination. Limited examinations for reoccurring indications may be performed as noted.  Right Carotid Findings: +----------+--------+--------+--------+-------------------------+--------+           PSV cm/sEDV cm/sStenosisPlaque Description       Comments +----------+--------+--------+--------+-------------------------+--------+ CCA Prox  98      2                                                  +----------+--------+--------+--------+-------------------------+--------+ CCA Distal42      8                                                 +----------+--------+--------+--------+-------------------------+--------+ ICA Prox  53      15      1-39%   hyperechoic and irregular         +----------+--------+--------+--------+-------------------------+--------+ ICA Mid   62      19                                                +----------+--------+--------+--------+-------------------------+--------+ ICA Distal54      15                                                +----------+--------+--------+--------+-------------------------+--------+ ECA       92      91                                                +----------+--------+--------+--------+-------------------------+--------+ +----------+--------+-------+--------+-------------------+           PSV cm/sEDV cmsDescribeArm Pressure (mmHG) +----------+--------+-------+--------+-------------------+ [id number]                                         +----------+--------+-------+--------+-------------------+ +---------+--------+--+--------+-+ VertebralPSV cm/s51EDV cm/s9 +---------+--------+--+--------+-+  Left Carotid Findings: +----------+--------+--------+--------+-------------------------+--------+           PSV cm/sEDV cm/sStenosisPlaque Description       Comments +----------+--------+--------+--------+-------------------------+--------+ CCA Prox  80      6                                                 +----------+--------+--------+--------+-------------------------+--------+ CCA Distal51      8                                                 +----------+--------+--------+--------+-------------------------+--------+  ICA Prox  65      20      1-39%   hyperechoic and irregular         +----------+--------+--------+--------+-------------------------+--------+ ICA Mid   58      17                                                 +----------+--------+--------+--------+-------------------------+--------+ ICA Distal41      12                                                +----------+--------+--------+--------+-------------------------+--------+ ECA       96      2                                                 +----------+--------+--------+--------+-------------------------+--------+ +----------+--------+--------+--------+-------------------+           PSV cm/sEDV cm/sDescribeArm Pressure (mmHG) +----------+--------+--------+--------+-------------------+ [id number]                                         +----------+--------+--------+--------+-------------------+ +---------+--------+--+--------+--+ VertebralPSV cm/s68EDV cm/s11 +---------+--------+--+--------+--+  Summary:   *See table(s) above for measurements and observations.  Electronically signed by Monica Martinez MD on 12/30/2018 at 1:43:00 PM.    Final     Transthoracic echocardiogram 12/27/2018: IMPRESSIONS   1. Left ventricular ejection fraction, by visual estimation, is 65 to 70%. The left ventricle has normal function. Normal left ventricular size. There is moderately increased left ventricular hypertrophy. 2. Left ventricular diastolic Doppler parameters are consistent with pseudonormalization pattern of LV diastolic filling. 3. Global right ventricle has normal systolic function.The right ventricular size is normal. No increase in right ventricular wall thickness. 4. Left atrial size was mildly dilated. 5. Right atrial size was normal. 6. Mild to moderate aortic valve annular calcification. 7. Moderate to severe mitral annular calcification. 8. The mitral valve is normal in structure. No evidence of mitral valve regurgitation. No evidence of mitral stenosis. 9. The tricuspid valve is normal in structure. Tricuspid valve regurgitation was not visualized by color flow  Doppler. 10. The aortic valve is normal in structure. Aortic valve regurgitation was not visualized by color flow Doppler. Mild to moderate aortic valve sclerosis/calcification without any evidence of aortic stenosis. 11. The pulmonic valve was normal in structure. Pulmonic valve regurgitation is not visualized by color flow Doppler. 12. Normal pulmonary artery systolic pressure. 13. A pacer wire is visualized. 14. The inferior vena cava is normal in size with greater than 50% respiratory variability, suggesting right atrial pressure of 3 mmHg.   Subjective: Patient seen and examined at bedside, resting comfortably.  Husband present.  No complaints.  Ready for discharge home.  Okay for discharge per cardiology standpoint.  Patient denies headache, no fever/chills/night sweats, no nausea/vomiting/diarrhea, no chest pain, palpitations, no abdominal pain, no weakness, no cough/congestion, no paresthesias.  No acute events overnight per nurse staff.   Discharge Exam: Vitals:   12/31/18 0842 12/31/18 1153  BP: (!) 124/47 (!) 119/59  Pulse: Marland Kitchen)  58 (!) 52  Resp:  20  Temp:  97.9 F (36.6 C)  SpO2: 98% 100%   Vitals:   12/30/18 2055 12/31/18 0551 12/31/18 0842 12/31/18 1153  BP: (!) 125/54 (!) 118/52 (!) 124/47 (!) 119/59  Pulse: 61 62 (!) 58 (!) 52  Resp: 18 18  20   Temp: 98.2 F (36.8 C) 97.6 F (36.4 C)  97.9 F (36.6 C)  TempSrc: Oral Oral  Oral  SpO2: 100% 100% 98% 100%  Weight:   117.6 kg   Height:        General exam: Appears calm and comfortable  Respiratory system: Clear to auscultation. Respiratory effort normal. Cardiovascular system: S1 & S2 heard, RRR. No JVD, murmurs, rubs, gallops or clicks. No pedal edema. Gastrointestinal system: Abdomen is nondistended, soft and nontender. No organomegaly or masses felt. Normal bowel sounds heard. Central nervous system: Alert and oriented. No focal neurological deficits. Extremities: Symmetric 5 x 5 power. Skin: Chronic  hyperpigmentation noted bilateral lower extremities Psychiatry: Judgement and insight appear normal. Mood & affect appropriate.     The results of significant diagnostics from this hospitalization (including imaging, microbiology, ancillary and laboratory) are listed below for reference.     Microbiology: Recent Results (from the past 240 hour(s))  Urine culture     Status: Abnormal   Collection Time: 12/26/18  1:43 PM   Specimen: Urine, Random  Result Value Ref Range Status   Specimen Description URINE, RANDOM  Final   Special Requests   Final    NONE Performed at [facility], [address], [place],  [address]    Culture >=100,000 COLONIES/mL ESCHERICHIA COLI (A)  Final   Report Status 12/28/2018 FINAL  Final   Organism ID, Bacteria ESCHERICHIA COLI (A)  Final      Susceptibility   Escherichia coli - MIC*    AMPICILLIN <=2 SENSITIVE Sensitive     CEFAZOLIN <=4 SENSITIVE Sensitive     CEFTRIAXONE <=1 SENSITIVE Sensitive     CIPROFLOXACIN <=0.25 SENSITIVE Sensitive     GENTAMICIN <=1 SENSITIVE Sensitive     IMIPENEM <=0.25 SENSITIVE Sensitive     NITROFURANTOIN <=16 SENSITIVE Sensitive     TRIMETH/SULFA <=20 SENSITIVE Sensitive     AMPICILLIN/SULBACTAM <=2 SENSITIVE Sensitive     PIP/TAZO <=4 SENSITIVE Sensitive     Extended ESBL NEGATIVE Sensitive     * >=100,000 COLONIES/mL ESCHERICHIA COLI  SARS CORONAVIRUS 2 (TAT 6-24 HRS) Nasopharyngeal Nasopharyngeal Swab     Status: None   Collection Time: 12/26/18  2:00 PM   Specimen: Nasopharyngeal Swab  Result Value Ref Range Status   SARS Coronavirus 2 NEGATIVE NEGATIVE Final    Comment: (NOTE) SARS-CoV-2 target nucleic acids are NOT DETECTED. The SARS-CoV-2 RNA is generally detectable in upper and lower respiratory specimens during the acute phase of infection. Negative results do not preclude SARS-CoV-2 infection, do not rule out co-infections with other pathogens, and should not be used as the sole basis for  treatment or other patient management decisions. Negative results must be combined with clinical observations, patient history, and epidemiological information. The expected result is Negative. Fact Sheet for Patients: SugarRoll.be Fact Sheet for Healthcare Providers: https://www.woods-mathews.com/ This test is not yet approved or cleared by the [place] FDA and  has been authorized for detection and/or diagnosis of SARS-CoV-2 by FDA under an Emergency Use Authorization (EUA). This EUA will remain  in effect (meaning this test can be used) for the duration of the COVID-19 declaration under  Section 56 4(b)(1) of the Act, 21 U.S.C. section 360bbb-3(b)(1), unless the authorization is terminated or revoked sooner. Performed at [facility], [place] [address], [place], [place] [address]      Labs: BNP (last 3 results) Recent Labs    12/26/18 1212  BNP 256.3*   Basic Metabolic Panel: Recent Labs  Lab 12/27/18 0539 12/28/18 0447 12/29/18 0429 12/30/18 0418 12/30/18 1709 12/30/18 1710 12/30/18 1714 12/31/18 0331  NA 140 138 136 138 143 141 145 139  K 4.5 4.3 3.7 3.7 3.6 3.9 3.3* 3.6  CL 97* 99 99 101  --   --   --  103  CO2 31 29 30 26   --   --   --  27  GLUCOSE 93 102* 96 93  --   --   --  92  BUN 55* 58* 58* 59*  --   --   --  51*  CREATININE 2.62* 2.55* 2.34* 2.25*  --   --   --  1.86*  CALCIUM 9.7 8.9 8.8* 8.8*  --   --   --  8.8*   Liver Function Tests: No results for input(s): AST, ALT, ALKPHOS, BILITOT, PROT, ALBUMIN in the last 168 hours. No results for input(s): LIPASE, AMYLASE in the last 168 hours. No results for input(s): AMMONIA in the last 168 hours. CBC: Recent Labs  Lab 12/26/18 1212 12/27/18 0539 12/30/18 1709 12/30/18 1710 12/30/18 1714  WBC 6.0 6.1  --   --   --   NEUTROABS 4.1 3.8  --   --   --   HGB 13.1 13.2 12.9 13.3 11.9*  HCT 42.3 41.3 38.0 39.0 35.0*  MCV 102.7* 101.2*  --   --   --    PLT 163 162  --   --   --    Cardiac Enzymes: No results for input(s): CKTOTAL, CKMB, CKMBINDEX, TROPONINI in the last 168 hours. BNP: Invalid input(s): POCBNP CBG: Recent Labs  Lab 12/30/18 0605 12/30/18 1313 12/30/18 2057 12/31/18 0603 12/31/18 1149  GLUCAP 88 94 130* 75 102*   D-Dimer No results for input(s): DDIMER in the last 72 hours. Hgb A1c No results for input(s): HGBA1C in the last 72 hours. Lipid Profile No results for input(s): CHOL, HDL, LDLCALC, TRIG, CHOLHDL, LDLDIRECT in the last 72 hours. Thyroid function studies No results for input(s): TSH, T4TOTAL, T3FREE, THYROIDAB in the last 72 hours.  Invalid input(s): FREET3 Anemia work up No results for input(s): VITAMINB12, FOLATE, FERRITIN, TIBC, IRON, RETICCTPCT in the last 72 hours. Urinalysis    Component Value Date/Time   COLORURINE YELLOW 12/26/2018 1343   APPEARANCEUR HAZY (A) 12/26/2018 1343   LABSPEC 1.011 12/26/2018 1343   PHURINE 8.0 12/26/2018 1343   GLUCOSEU NEGATIVE 12/26/2018 1343   HGBUR NEGATIVE 12/26/2018 1343   BILIRUBINUR NEGATIVE 12/26/2018 1343   KETONESUR NEGATIVE 12/26/2018 1343   PROTEINUR 100 (A) 12/26/2018 1343   UROBILINOGEN 0.2 08/31/2014 1158   NITRITE NEGATIVE 12/26/2018 1343   LEUKOCYTESUR LARGE (A) 12/26/2018 1343   Sepsis Labs Invalid input(s): PROCALCITONIN,  WBC,  LACTICIDVEN Microbiology Recent Results (from the past 240 hour(s))  Urine culture     Status: Abnormal   Collection Time: 12/26/18  1:43 PM   Specimen: Urine, Random  Result Value Ref Range Status   Specimen Description URINE, RANDOM  Final   Special Requests   Final    NONE Performed at [facility], [place] [address] [place], [place] [address]  Culture >=100,000 COLONIES/mL ESCHERICHIA COLI (A)  Final   Report Status 12/28/2018 FINAL  Final   Organism ID, Bacteria ESCHERICHIA COLI (A)  Final      Susceptibility   Escherichia coli - MIC*    AMPICILLIN <=2 SENSITIVE Sensitive     CEFAZOLIN  <=4 SENSITIVE Sensitive     CEFTRIAXONE <=1 SENSITIVE Sensitive     CIPROFLOXACIN <=0.25 SENSITIVE Sensitive     GENTAMICIN <=1 SENSITIVE Sensitive     IMIPENEM <=0.25 SENSITIVE Sensitive     NITROFURANTOIN <=16 SENSITIVE Sensitive     TRIMETH/SULFA <=20 SENSITIVE Sensitive     AMPICILLIN/SULBACTAM <=2 SENSITIVE Sensitive     PIP/TAZO <=4 SENSITIVE Sensitive     Extended ESBL NEGATIVE Sensitive     * >=100,000 COLONIES/mL ESCHERICHIA COLI  SARS CORONAVIRUS 2 (TAT 6-24 HRS) Nasopharyngeal Nasopharyngeal Swab     Status: None   Collection Time: 12/26/18  2:00 PM   Specimen: Nasopharyngeal Swab  Result Value Ref Range Status   SARS Coronavirus 2 NEGATIVE NEGATIVE Final    Comment: (NOTE) SARS-CoV-2 target nucleic acids are NOT DETECTED. The SARS-CoV-2 RNA is generally detectable in upper and lower respiratory specimens during the acute phase of infection. Negative results do not preclude SARS-CoV-2 infection, do not rule out co-infections with other pathogens, and should not be used as the sole basis for treatment or other patient management decisions. Negative results must be combined with clinical observations, patient history, and epidemiological information. The expected result is Negative. Fact Sheet for Patients: SugarRoll.be Fact Sheet for Healthcare Providers: https://www.woods-mathews.com/ This test is not yet approved or cleared by the [place] FDA and  has been authorized for detection and/or diagnosis of SARS-CoV-2 by FDA under an Emergency Use Authorization (EUA). This EUA will remain  in effect (meaning this test can be used) for the duration of the COVID-19 declaration under Section 56 4(b)(1) of the Act, 21 U.S.C. section 360bbb-3(b)(1), unless the authorization is terminated or revoked sooner. Performed at [facility], [place] [address], [place], [place] [address]      Time coordinating discharge: Over 30  minutes  SIGNED:   Donnamarie Poag [place] DO  [facility] 12/31/2018, 12:19 PM

## 2018-12-31 NOTE — Progress Notes (Addendum)
Advanced Heart Failure Rounding Note  PCP-Cardiologist: No primary care provider on file.   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 RHC done 9/24 with low filling pressures  Post cath diuretics held due to low filling pressures. Creatinine down to 1.86.   Feeling better. Denies SOB.   Objective:   Weight Range: 117.6 kg Body mass index is 47.41 kg/m.   Vital Signs:   Temp:  [97.6 F (36.4 C)-98.2 F (36.8 C)] 97.6 F (36.4 C) (09/25 0551) Pulse Rate:  [58-68] 58 (09/25 0842) Resp:  [10-20] 18 (09/25 0551) BP: (118-165)/(47-78) 124/47 (09/25 0842) SpO2:  [98 %-100 %] 98 % (09/25 0842) Weight:  [117.6 kg] 117.6 kg (09/25 0842) Last BM Date: 12/27/18  Weight change: Filed Weights   12/29/18 0205 12/30/18 0532 12/31/18 0842  Weight: 116.7 kg 116.8 kg 117.6 kg    Intake/Output:   Intake/Output Summary (Last 24 hours) at 12/31/2018 1056 Last data filed at 12/31/2018 0921 Gross per 24 hour  Intake 1044.17 ml  Output 650 ml  Net 394.17 ml      Physical Exam   General:  In bed.  No resp difficulty HEENT: normal anicteric Neck: supple. no JVD. Carotids 2+ bilat; no bruits. No lymphadenopathy or thryomegaly appreciated. Cor: PMI nondisplaced. Brady regular  No rubs, gallops or murmurs. Lungs: clear no wheeze Abdomen: obese soft, nontender, nondistended. No hepatosplenomegaly. No bruits or masses. Good bowel sounds. Extremities: no cyanosis, clubbing, rash, trace edmea. R and LLE hyperpigmentation.  Neuro: alert & oriented x 3, cranial nerves grossly intact. moves all 4 extremities w/o difficulty. Affect pleasant   Telemetry   Sinus Brady 50  Personally reviewed   EKG    N/A  Labs    CBC Recent Labs    12/30/18 1710 12/30/18 1714  HGB 13.3 11.9*  HCT 39.0 [id number]*   Basic Metabolic Panel Recent Labs    12/30/18 0418  12/30/18 1714 12/31/18 0331  NA 138   < > 145 139  K 3.7   < > 3.3* 3.6  CL 101  --   --  103  CO2 26  --   --  27  GLUCOSE 93   --   --  92  BUN 59*  --   --  51*  CREATININE 2.25*  --   --  1.86*  CALCIUM 8.8*  --   --  8.8*   < > = values in this interval not displayed.   Liver Function Tests No results for input(s): AST, ALT, ALKPHOS, BILITOT, PROT, ALBUMIN in the last 72 hours. No results for input(s): LIPASE, AMYLASE in the last 72 hours. Cardiac Enzymes No results for input(s): CKTOTAL, CKMB, CKMBINDEX, TROPONINI in the last 72 hours.  BNP: BNP (last 3 results) Recent Labs    12/26/18 1212  BNP 430.9*    ProBNP (last 3 results) No results for input(s): PROBNP in the last 8760 hours.   D-Dimer No results for input(s): DDIMER in the last 72 hours. Hemoglobin A1C No results for input(s): HGBA1C in the last 72 hours. Fasting Lipid Panel No results for input(s): CHOL, HDL, LDLCALC, TRIG, CHOLHDL, LDLDIRECT in the last 72 hours. Thyroid Function Tests No results for input(s): TSH, T4TOTAL, T3FREE, THYROIDAB in the last 72 hours.  Invalid input(s): FREET3  Other results:   Imaging    Nm Cardiac Amyloid Tumor Loc Inflam Spect 1 Day  Result Date: 12/30/2018 CLINICAL DATA:  HEART FAILURE. CONCERN FOR CARDIAC AMYLOIDOSIS. EXAM: NUCLEAR  MEDICINE TUMOR LOCALIZATION. PYP CARDIAC AMYLOIDOSIS SCAN WITH SPECT TECHNIQUE: Following intravenous administration of radiopharmaceutical, anterior planar images of the chest were obtained. Regions of interest were placed on the heart and contralateral chest wall for quantitative assessment. Additional SPECT imaging of the chest was obtained. RADIOPHARMACEUTICALS:  8 mCi TECHNETIUM 99 PYROPHOSPHATE FINDINGS: Planar Visual assessment: Anterior planar imaging demonstrates radiotracer uptake within the heart less than than uptake within the adjacent ribs (Grade 1). Quantitative assessment : Quantitative assessment of the cardiac uptake compared to the contralateral chest wall is equal to 1.3 (H/CL = 1.3). SPECT assessment: SPECT imaging of the chest demonstrates minimal  radiotracer accumulation within the LEFT ventricle. IMPRESSION: Visual and quantitative assessment (grade 1, H/CLL equal 1.3) are NOT suggestive of transthyretin amyloidosis. Electronically Signed   By: Suzy Bouchard M.D.   On: 12/30/2018 16:18     Medications:     Scheduled Medications:  citalopram  20 mg Oral QHS   ferrous sulfate  325 mg Oral Daily   hydrALAZINE  25 mg Oral TID   hydrocortisone cream   Topical TID   insulin glargine  10 Units Subcutaneous Daily   isosorbide mononitrate  60 mg Oral Daily   levothyroxine  75 mcg Oral QAC breakfast   Melatonin  9 mg Oral QHS   rosuvastatin  10 mg Oral QHS   sodium chloride flush  3 mL Intravenous Q12H   sodium chloride flush  3 mL Intravenous Q12H   sodium chloride flush  3 mL Intravenous Q12H   Warfarin - Pharmacist Dosing Inpatient   Does not apply q1800    Infusions:  sodium chloride     sodium chloride      PRN Medications: sodium chloride, sodium chloride, acetaminophen, HYDROcodone-acetaminophen, insulin aspart, ondansetron (ZOFRAN) IV, sodium chloride flush, sodium chloride flush    Assessment/Plan   1. A/C Diastolic Heart Failure  ECHO 12/2017 EF 65-70% , moderately increased LVH, Diuresed with IV lasix. RHC completed with preserved cardiac output and low filling pressures. Hold diuretics today. Start torsemide 20 mg dailydaily tomorrow. Keep off spironolactone for now.  -Myeloma panel pending.  - PYP scan not convincing for amyloid. Repeat PYP in 6 month.   - Will need sleep study set up down the road.   2. CKD  On admit creatinine 2.3. Todays creatinine down to 1.86  Baseline creatinine is < 1.6-1.8   3, UTI On cefitriaxone.   4. H/O CHB with MDT PPM-  EP reprogrammed this admit to VVI 50   5. Marland Kitchen DMII On insulin.   6. . Hypothyroidism  On levothyroxine  7. H/O PEs, On coumadin. INR therapeutic.   Start torsemide 20 mg daily 9/ 26  - take extra as needed Hydralazine 25 mg  three times a day  Imdur 60 mg daily  Crestor 10 mg daily.   We have set up HF follow up.   Length of Stay: 5  Amy Clegg, NP  12/31/2018, 10:56 AM  Advanced Heart Failure Team Pager [phone] (M-F; 7a - 4p)  Please contact [facility] for night-coverage after hours (4p -7a ) and weekends on [email or web address]   Patient seen and examined with the above-signed Advanced Practice Provider and/or Housestaff. I personally reviewed laboratory data, imaging studies and relevant notes. I independently examined the patient and formulated the important aspects of the plan. I have edited the note to reflect any of my changes or salient points. I have personally discussed the plan with the patient and/or family.  RHC reviewed.  Low fimming pressures. Creatinine now improving off diuretics. PYP negative (Personally reviewed). Can go home today from our standpoint. Drop torsemide to 20 daily. Can take extra as needed. Will f/u in HF Clinic.   Glori Bickers, MD  12:19 PM

## 2018-12-31 NOTE — Progress Notes (Signed)
Patient alert oriented denies pain, iv and tele d/c. D/c instruction explain and given to the patient all questions answered to the patient satisfaction.

## 2019-01-06 ENCOUNTER — Telehealth: Payer: Self-pay

## 2019-01-06 NOTE — Telephone Encounter (Signed)
I let the pt know we do not need manual transmissions with her home monitor. I told her that her monitor transmit automatically. I told her if we need a manual transmission we will give her a call. The pt verbalized understanding and she thanked me for the call.

## 2019-01-11 ENCOUNTER — Encounter (HOSPITAL_COMMUNITY): Payer: Medicare HMO

## 2019-01-14 ENCOUNTER — Encounter: Payer: Medicare HMO | Admitting: Internal Medicine

## 2019-01-19 ENCOUNTER — Other Ambulatory Visit: Payer: Self-pay

## 2019-01-19 ENCOUNTER — Ambulatory Visit (HOSPITAL_COMMUNITY)
Admission: RE | Admit: 2019-01-19 | Discharge: 2019-01-19 | Disposition: A | Discharge status: Home / Self Care | Payer: Medicare HMO | Source: Ambulatory Visit | Attending: Internal Medicine | Admitting: Internal Medicine

## 2019-01-19 ENCOUNTER — Encounter (HOSPITAL_COMMUNITY): Payer: Self-pay

## 2019-01-19 VITALS — BP 152/84 | HR 77 | Wt 271.4 lb

## 2019-01-19 DIAGNOSIS — I509 Heart failure, unspecified: Secondary | ICD-10-CM

## 2019-01-19 DIAGNOSIS — G8929 Other chronic pain: Secondary | ICD-10-CM | POA: Diagnosis not present

## 2019-01-19 DIAGNOSIS — Z79899 Other long term (current) drug therapy: Secondary | ICD-10-CM | POA: Diagnosis not present

## 2019-01-19 DIAGNOSIS — F329 Major depressive disorder, single episode, unspecified: Secondary | ICD-10-CM | POA: Insufficient documentation

## 2019-01-19 DIAGNOSIS — Z87891 Personal history of nicotine dependence: Secondary | ICD-10-CM | POA: Insufficient documentation

## 2019-01-19 DIAGNOSIS — M109 Gout, unspecified: Secondary | ICD-10-CM | POA: Diagnosis not present

## 2019-01-19 DIAGNOSIS — E039 Hypothyroidism, unspecified: Secondary | ICD-10-CM | POA: Insufficient documentation

## 2019-01-19 DIAGNOSIS — I4891 Unspecified atrial fibrillation: Secondary | ICD-10-CM | POA: Diagnosis not present

## 2019-01-19 DIAGNOSIS — Z7901 Long term (current) use of anticoagulants: Secondary | ICD-10-CM | POA: Insufficient documentation

## 2019-01-19 DIAGNOSIS — M1990 Unspecified osteoarthritis, unspecified site: Secondary | ICD-10-CM | POA: Insufficient documentation

## 2019-01-19 DIAGNOSIS — R05 Cough: Secondary | ICD-10-CM | POA: Diagnosis not present

## 2019-01-19 DIAGNOSIS — Z794 Long term (current) use of insulin: Secondary | ICD-10-CM | POA: Insufficient documentation

## 2019-01-19 DIAGNOSIS — Z8249 Family history of ischemic heart disease and other diseases of the circulatory system: Secondary | ICD-10-CM | POA: Insufficient documentation

## 2019-01-19 DIAGNOSIS — E1122 Type 2 diabetes mellitus with diabetic chronic kidney disease: Secondary | ICD-10-CM | POA: Insufficient documentation

## 2019-01-19 DIAGNOSIS — E7800 Pure hypercholesterolemia, unspecified: Secondary | ICD-10-CM | POA: Insufficient documentation

## 2019-01-19 DIAGNOSIS — Z86718 Personal history of other venous thrombosis and embolism: Secondary | ICD-10-CM | POA: Insufficient documentation

## 2019-01-19 DIAGNOSIS — Z86711 Personal history of pulmonary embolism: Secondary | ICD-10-CM | POA: Insufficient documentation

## 2019-01-19 DIAGNOSIS — I130 Hypertensive heart and chronic kidney disease with heart failure and stage 1 through stage 4 chronic kidney disease, or unspecified chronic kidney disease: Secondary | ICD-10-CM | POA: Insufficient documentation

## 2019-01-19 DIAGNOSIS — F419 Anxiety disorder, unspecified: Secondary | ICD-10-CM | POA: Diagnosis not present

## 2019-01-19 DIAGNOSIS — N189 Chronic kidney disease, unspecified: Secondary | ICD-10-CM | POA: Insufficient documentation

## 2019-01-19 DIAGNOSIS — I5033 Acute on chronic diastolic (congestive) heart failure: Secondary | ICD-10-CM | POA: Insufficient documentation

## 2019-01-19 DIAGNOSIS — R6881 Early satiety: Secondary | ICD-10-CM | POA: Diagnosis not present

## 2019-01-19 DIAGNOSIS — R062 Wheezing: Secondary | ICD-10-CM | POA: Diagnosis not present

## 2019-01-19 LAB — BASIC METABOLIC PANEL
Anion gap: 12 (ref 5–15)
BUN: 29 mg/dL — ABNORMAL HIGH (ref 8–23)
CO2: 27 mmol/L (ref 22–32)
Calcium: 9.2 mg/dL (ref 8.9–10.3)
Chloride: 103 mmol/L (ref 98–111)
Creatinine, Ser: 1.73 mg/dL — ABNORMAL HIGH (ref 0.44–1.00)
GFR calc Af Amer: 36 mL/min — ABNORMAL LOW (ref >60)
GFR calc non Af Amer: 31 mL/min — ABNORMAL LOW (ref >60)
Glucose, Bld: 122 mg/dL — ABNORMAL HIGH (ref 70–99)
Potassium: 3.7 mmol/L (ref 3.5–5.1)
Sodium: 142 mmol/L (ref 135–145)

## 2019-01-19 MED ORDER — TORSEMIDE 20 MG PO TABS: 40.0000 mg | ORAL_TABLET | Freq: Every day | ORAL | 3 refills | Status: DC

## 2019-01-19 NOTE — Progress Notes (Signed)
Patient Name:         DOB:       Height:     Weight:  Office Name:         Referring Provider:  Today's Date:  Date:   STOP BANG RISK ASSESSMENT S (snore) Have you been told that you snore?     YES   T (tired) Are you often tired, fatigued, or sleepy during the day?   YES  O (obstruction) Do you stop breathing, choke, or gasp during sleep? YES   P (pressure) Do you have or are you being treated for high blood pressure? YES   B (BMI) Is your body index greater than 35 kg/m? YES   A (age) Are you 50 years old or older? YES   N (neck) Do you have a neck circumference greater than 16 inches?   NO   G (gender) Are you a female? NO   TOTAL STOP/BANG "YES" ANSWERS                                                                        For Office Use Only              Procedure Order Form    YES to 3+ Stop Bang questions OR two clinical symptoms - patient qualifies for [employer]PAT (CPT 95800)     Submit: This Form + Patient Face Sheet + Clinical Note via CloudPAT or Fax: [phone]         Clinical Notes: Will consult Sleep Specialist and refer for management of therapy due to patient increased risk of Sleep Apnea. Ordering a sleep study due to the following two clinical symptoms: Excessive daytime sleepiness G47.10 / Gastroesophageal reflux K21.9 / Nocturia R35.1 / Morning Headaches G44.221 / Difficulty concentrating R41.840 / Memory problems or poor judgment G31.84 / Personality changes or irritability R45.4 / Loud snoring R06.83 / Depression F32.9 / Unrefreshed by sleep G47.8 / Impotence N52.9 / History of high blood pressure R03.0 / Insomnia G47.00    I understand that I am proceeding with a home sleep apnea test as ordered by my treating physician. I understand that untreated sleep apnea is a serious cardiovascular risk factor and it is my responsibility to perform the test and seek management for sleep apnea. I will be contacted with the results and be managed for sleep apnea by a  local sleep physician. I will be receiving equipment and further instructions from [facility] [employer]. I shall promptly ship back the equipment via the included mailing label. I understand my insurance will be billed for the test and as the patient I am responsible for any insurance related out-of-pocket costs incurred. I have been provided with written instructions and can call for additional video or telephonic instruction, with 24-hour availability of qualified personnel to answer any questions: Patient Help Desk [phone].  Patient Signature ______________________________________________________   Date______________________ Patient Telemedicine Verbal Consent     

## 2019-01-19 NOTE — Patient Instructions (Signed)
INCREASE Torsemide to 40 mg (2 tabs) daily   Labs today We will only contact you if something comes back abnormal or we need to make some changes. Otherwise no news is good news!  Your provider has recommended that you have a home sleep study.  [employer] is the company that does these test.  They will contact you by phone and must speak with you before they can ship the equipment.  Once they have spoken with you they will send the equipment right to your home with instructions on how to set it up.  Once you have completed the test simply box all the equipment back up and mail back to the company.  IF you have any questions or issues with the equipment please call the company directly at [phone].  If your test is positive for sleep apnea and you need a home CPAP machine you will be contacted by Dr Theodosia Blender [facility]) to set this up.  Your physician recommends that you schedule a follow-up appointment in: 1 week  in the Advanced Practitioners (PA/NP) Clinic   Do the following things EVERYDAY: 1) Weigh yourself in the morning before breakfast. Write it down and keep it in a log. 2) Take your medicines as prescribed 3) Eat low salt foods-Limit salt (sodium) to 2000 mg per day.  4) Stay as active as you can everyday 5) Limit all fluids for the day to less than 2 liters  At the [facility], you and your health needs are our priority. As part of our continuing mission to provide you with exceptional heart care, we have created designated Provider Care Teams. These Care Teams include your primary Cardiologist (physician) and Advanced Practice Providers (APPs- Physician Assistants and Nurse Practitioners) who all work together to provide you with the care you need, when you need it.   You may see any of the following providers on your designated Care Team at your next follow up: Marland Kitchen Dr Glori Bickers . Dr Loralie Champagne . Darrick Grinder, NP . Lyda Jester,  PA   Please be sure to bring in all your medications bottles to every appointment.

## 2019-01-19 NOTE — Progress Notes (Signed)
Advanced Heart Failure Clinic Note   Referring Physician: PCP: [name], MD PCP-Cardiologist: No primary care provider on file.   HPI: [name] is a 63 y/o female w/ h/o obesity, HTN, DM, atrial fib, DVT, PE x3 , s/p IVC filter placed in 2013, CHB s/p MDT single chamber PPM 08/2018, and chronic diastolic heart failure. In 2013, she had sleep study with mild sleep apnea noted.   R/LHC in 2016 showed no significant coronary diseased, cardiac output 8.9 and PA pressured 41/13.    In January 2020 she had presyncopal episode and had some intermittent dizziness.  She told her PCP and was instructed to keep a log. In May she had presyncope and a syncopal episode. She had a fall off her porch. This was determined to be in the setting of CHB. MDT single chamber PPM placed. She also had minimally displaced C6 fracture.  She has functional limitation at baseline due to chronic knee pain. She uses a walker for ambulation.    She recently presented to [facility] on 9/20 w/ dyspnea and cough. Initial w/u in the ED c/w acute on chronic diastolic HF. Admitted and started on IV diuretics. 2D echo showed EF 65-70% w/ pseudonormalization of LV consistent with diastolic dysfunction. She was diuresed w/ Lasix. RHC completed with preserved cardiac output and low filling pressures. PYP scan not convincing for amyloid. Outpatient sleep study recommended. EP saw 9/22 reprogrammed to VVI 50 due to asynchronous V pacing.Prior to d/c was transitioned to PO torsemide 20 mg daily. Also sent home on Hydralazine 25 mg three times a day, Imdur 60 mg daily, Crestor 10 mg daily. Her discharge wt was 117 kg (257 lb).  She presents to clinic today for post hospital f/u. Here with her husband. Weight has been steady increasing at home and up 14 lb at  271 lb. She notes abdominal fullness and early satiety. No significant exertional or resting dyspnea. Reports full compliance w/ home diuretics and has been following low sodium  diet.    Echo 12/27/18  1. Left ventricular ejection fraction, by visual estimation, is 65 to 70%. The left ventricle has normal function. Normal left ventricular size. There is moderately increased left ventricular hypertrophy. 2. Left ventricular diastolic Doppler parameters are consistent with pseudonormalization pattern of LV diastolic filling. 3. Global right ventricle has normal systolic function.The right ventricular size is normal. No increase in right ventricular wall thickness. 4. Left atrial size was mildly dilated. 5. Right atrial size was normal. 6. Mild to moderate aortic valve annular calcification. 7. Moderate to severe mitral annular calcification. 8. The mitral valve is normal in structure. No evidence of mitral valve regurgitation. No evidence of mitral stenosis. 9. The tricuspid valve is normal in structure. Tricuspid valve regurgitation was not visualized by color flow Doppler. 10. The aortic valve is normal in structure. Aortic valve regurgitation was not visualized by color flow Doppler. Mild to moderate aortic valve sclerosis/calcification without any evidence of aortic stenosis. 11. The pulmonic valve was normal in structure. Pulmonic valve regurgitation is not visualized by color flow Doppler. 12. Normal pulmonary artery systolic pressure. 13. A pacer wire is visualized. 14. The inferior vena cava is normal in size with greater than 50% respiratory variability, suggesting right atrial pressure of 3 mmHg.    Review of Systems: [y] = yes, [ ]  = no   General: Weight gain [Y ]; Weight loss [ ] ; Anorexia [ ] ; Fatigue [ ] ; Fever [ ] ; Chills [ ] ; Weakness [ ]   Cardiac: Chest pain/pressure [ ] ; Resting SOB [ ] ; Exertional SOB [ ] ; Orthopnea [ ] ; Pedal Edema [ ] ; Palpitations [ ] ; Syncope [ ] ; Presyncope [ ] ; Paroxysmal nocturnal dyspnea[ ]   Pulmonary: Cough [ ] ; Wheezing[ ] ; Hemoptysis[ ] ; Sputum [ ] ; Snoring [ ]   GI: Vomiting[ ] ; Dysphagia[ ] ; Melena[ ] ;  Hematochezia [ ] ; Heartburn[ ] ; Abdominal pain [ ] ; Constipation [ ] ; Diarrhea [ ] ; BRBPR [ ]   GU: Hematuria[ ] ; Dysuria [ ] ; Nocturia[ ]   Vascular: Pain in legs with walking [ ] ; Pain in feet with lying flat [ ] ; Non-healing sores [ ] ; Stroke [ ] ; TIA [ ] ; Slurred speech [ ] ;  Neuro: Headaches[ ] ; Vertigo[ ] ; Seizures[ ] ; Paresthesias[ ] ;Blurred vision [ ] ; Diplopia [ ] ; Vision changes [ ]   Ortho/Skin: Arthritis [ ] ; Joint pain [ ] ; Muscle pain [ ] ; Joint swelling [ ] ; Back Pain [ ] ; Rash [ ]   Psych: Depression[ ] ; Anxiety[ ]   Heme: Bleeding problems [ ] ; Clotting disorders [ ] ; Anemia [ ]   Endocrine: Diabetes [ ] ; Thyroid dysfunction[ ]    Past Medical History:  Diagnosis Date  . A-fib ([place])   . Anemia   . Anxiety   . Arthritis    "right hip; both knees; left wrist/shoulder; back" (01/19/2013"  . Bleeding on Coumadin 08/2012; 01/18/2013   BRBPR admissions (01/19/2013)  . CHF (congestive heart failure) ([place])    "2-3 times" (01/19/2013)  . Chronic lower back pain   . Depression   . DVT (deep venous thrombosis) ([place]) 10 years ago   numerous/notes 01/18/2013  . GERD (gastroesophageal reflux disease)   . Gout   . [id number])    "maybe weekly" (01/19/2013)  . Heart murmur   . High cholesterol    "been off RX for this at one time" (01/18/2013)  . History of blood transfusion 1983; 04/2012   "3 w/ childbirth; hospitalized for pain" (01/19/2013)  . Hypertension   . Hypothyroidism   . Migraines    "twice/yr maybe" (01/19/2013)  . OSA (obstructive sleep apnea)    "sent me for test in 04/2012; never ordered mask, etc" (01/19/2013)  . PE (pulmonary thromboembolism) ([place]) 3 years ag0   3/notes 01/18/2013  . Pneumonia before 2011   "once' (01/18/2013)  . Renal disorder    kindey function low; "Metformin was destroying my kidneys" (01/19/2013)  . Shortness of breath    "only related to my CHF" (01/18/2013)  . Swelling of hand 08/31/2014   RT HAND  . Type II diabetes mellitus ([place])    . UTI (urinary tract infection) 08/31/2014    Current Outpatient Medications  Medication Sig Dispense Refill  . acetaminophen (TYLENOL) 500 MG tablet Take 500 mg by mouth every 6 (six) hours as needed for fever or headache (pain).    Marland Kitchen allopurinol (ZYLOPRIM) 300 MG tablet Take 300 mg by mouth daily.    . Biotin 1000 MCG tablet Take 500 mcg by mouth daily with lunch.     . Calcium Citrate-Vitamin D (CITRACAL/VITAMIN D PO) Take 2 tablets by mouth every evening.    . citalopram (CELEXA) 20 MG tablet Take 20 mg by mouth at bedtime.    . ferrous sulfate 325 (65 FE) MG tablet Take 325 mg by mouth daily.    . insulin aspart (NOVOLOG) 100 UNIT/ML injection Inject 2 Units into the skin 3 (three) times daily with meals. (Patient taking differently: Inject 8 Units into the skin 3 (three) times daily as needed for high blood  sugar (CBG >160). ) 10 mL 11  . Insulin Detemir (LEVEMIR FLEXTOUCH) 100 UNIT/ML Pen Inject 20 Units into the skin daily before breakfast.     . isosorbide mononitrate (IMDUR) 60 MG 24 hr tablet Take 1 tablet (60 mg total) by mouth daily. 90 tablet 0  . levothyroxine (SYNTHROID) 75 MCG tablet Take 75 mcg by mouth daily before breakfast.     . Melatonin 5 MG TABS Take 10 mg by mouth at bedtime.     . Multiple Vitamin (MULITIVITAMIN WITH MINERALS) TABS Take 1 tablet by mouth daily.    . Omega-3 Fatty Acids (FISH OIL) 1000 MG CAPS Take 1,000 mg by mouth daily.     . rosuvastatin (CRESTOR) 10 MG tablet Take 1 tablet (10 mg total) by mouth daily. (Patient taking differently: Take 10 mg by mouth at bedtime. ) 30 tablet 0  . Semaglutide,0.25 or 0.5MG /DOS, (OZEMPIC, 0.25 OR 0.5 MG/DOSE,) 2 MG/1.5ML SOPN Inject 0.5 mg into the skin once a week.    . torsemide (DEMADEX) 20 MG tablet Take 2 tablets (40 mg total) by mouth daily. 60 tablet 3  . warfarin (COUMADIN) 2.5 MG tablet TAKE 7.5 MG ON M-W-F AND 5 MG TUES, THURS, SAT AND SUN AS DIRECTED (Patient taking differently: Take 5-7.5 mg by mouth See  admin instructions. Take 2 tablets (5 mg) by mouth on Monday,Wednesday, Friday, Saturday and Sunday. Take 3 tablets (7.5 mg) on Tuesday and Thursday.) 120 tablet 1   No current facility-administered medications for this encounter.     Allergies  Allergen Reactions  . Morphine And Related Rash      Social History   Socioeconomic History  . Marital status: Single    Spouse name: Not on file  . Number of children: Not on file  . Years of education: Not on file  . Highest education level: Not on file  Occupational History  . Occupation: disabled  Social Needs  . Financial resource strain: Not on file  . Food insecurity    Worry: Not on file    Inability: Not on file  . Transportation needs    Medical: Not on file    Non-medical: Not on file  Tobacco Use  . Smoking status: Former Smoker    Packs/day: 0.05    Years: 30.00    Pack years: 1.50    Types: Cigarettes    Quit date: 1990    Years since quitting: 30.8  . Smokeless tobacco: Never Used  . Tobacco comment: 01/19/2013 "quit smoking cigarettes in the early '90's"  Substance and Sexual Activity  . Alcohol use: Not Currently    Comment: rare now but never a heavy drinker  . Drug use: No  . Sexual activity: Never  Lifestyle  . Physical activity    Days per week: Not on file    Minutes per session: Not on file  . Stress: Not on file  Relationships  . Social Herbalist on phone: Not on file    Gets together: Not on file    Attends religious service: Not on file    Active member of club or organization: Not on file    Attends meetings of clubs or organizations: Not on file    Relationship status: Not on file  . Intimate partner violence    Fear of current or ex partner: Not on file    Emotionally abused: Not on file    Physically abused: Not on file    Forced  sexual activity: Not on file  Other Topics Concern  . Not on file  Social History Narrative  . Not on file      Family History  Problem  Relation Age of Onset  . Cerebral aneurysm Mother   . Hypertension Father   . Cerebral aneurysm Maternal Grandfather   . Cerebral aneurysm Maternal Aunt   . Cancer Maternal Uncle     Vitals:   01/19/19 1211  BP: (!) 152/84  Pulse: 77  SpO2: 97%  Weight: 123.1 kg (271 lb 6.4 oz)     PHYSICAL EXAM: General:  Super morbidly obese WF. Well appearing. No respiratory difficulty HEENT: normal Neck: supple. Thick neck, difficult to assess JVD. Carotids 2+ bilat; no bruits. No lymphadenopathy or thyromegaly appreciated. Cor: PMI nondisplaced. Regular rate & rhythm. No rubs, gallops or murmurs. Lungs: clear Abdomen: obese, soft, nontender, nondistended. No hepatosplenomegaly. No bruits or masses. Good bowel sounds. Extremities: no cyanosis, clubbing, rash, trace bilateral LEE w/ chronic venous stasis dermatitis  Neuro: alert & oriented x 3, cranial nerves grossly intact. moves all 4 extremities w/o difficulty. Affect pleasant.  ECG: not performed    ASSESSMENT & PLAN:  1. A/C Diastolic Heart Failure: ECHO 12/2017 EF 65-70% , moderately increased LVH. [place] 12/2018 with preserved cardiac output and low filling pressures. PYP scan not convincing for amyloid. Discharged home on 20 mg of torsemide. -Weight has been steady increasing at home and up 14 lb at  271 lb. She notes abdominal fullness and early satiety. No significant exertional or resting dyspnea. Reports full compliance w/ home diuretics and has been following low sodium diet.  -Will increase torsemide to 40 mg daily -Check BMP today and again in 7 days -Continue Imdur  for BP control -F/u in 7 days to reassess volume status  -Will arrange sleep study to r/o OSA    2.CKD -Baseline creatinine is ~1.6-1.8  -Plan to increase home diuretic for volume control -BMP today   4. HTN: BP elevated today at 152/84 but has not taken home meds yet -BP have been stable at home in the [id number] systolic -Continue Imdur 60 mg -Increase  torsemide to 40 daily   4.H/O CHB with MDT PPM-  - EP reprogrammed  to VVI 50   5. DM:  - On insulin.  - Followed by PCP   6. Hypothyroidism  -On levothyroxine. Followed by PCP  7. H/O PEs: - On coumadin.   Lyda Jester, PA-C 01/19/19

## 2019-01-28 ENCOUNTER — Ambulatory Visit: Payer: Medicare HMO | Admitting: Cardiovascular Disease

## 2019-01-31 ENCOUNTER — Inpatient Hospital Stay (HOSPITAL_COMMUNITY)
Admission: RE | Admit: 2019-01-31 | Discharge: 2019-01-31 | Disposition: A | Discharge status: Home / Self Care | Payer: Medicare HMO | Source: Ambulatory Visit

## 2019-01-31 ENCOUNTER — Telehealth (HOSPITAL_COMMUNITY): Payer: Self-pay | Admitting: Cardiology

## 2019-01-31 MED ORDER — POTASSIUM CHLORIDE CRYS ER 20 MEQ PO TBCR: 20.0000 meq | EXTENDED_RELEASE_TABLET | Freq: Every day | ORAL | 3 refills | Status: DC

## 2019-01-31 NOTE — Telephone Encounter (Signed)
Notes recorded by Kerry Dory, CMA on 01/31/2019 at 3:19 PM EDT  Pt aware and voiced understanding  ------   Notes recorded by Kerry Dory, CMA on 01/25/2019 at 4:21 PM EDT  Left message for patient to call [phone] (H)   ------   Notes recorded by Kerry Dory, CMA on 01/19/2019 at 4:24 PM EDT  Left message for patient to call back.  [phone] (H)  ------   Notes recorded by Consuelo Pandy, PA-C on 01/19/2019 at 3:45 PM EDT  Renal function is stable. K lower end of normal. Suspect she will need K supplementation given increase in torsemide dose. Add Kdur 20 mEq daily and plan repeat BMP on return visit in 1 week.

## 2019-01-31 NOTE — Telephone Encounter (Signed)
-----   Message from Consuelo Pandy, [place] sent at 01/19/2019  3:45 PM EDT ----- Renal function is stable. K lower end of normal. Suspect she will need K supplementation given increase in torsemide dose. Add Kdur 20 mEq daily and plan repeat BMP on return visit in 1 week.

## 2019-02-02 ENCOUNTER — Other Ambulatory Visit: Payer: Self-pay | Admitting: Internal Medicine

## 2019-02-02 DIAGNOSIS — Z1231 Encounter for screening mammogram for malignant neoplasm of breast: Secondary | ICD-10-CM

## 2019-02-08 ENCOUNTER — Ambulatory Visit (HOSPITAL_COMMUNITY)
Admission: RE | Admit: 2019-02-08 | Discharge: 2019-02-08 | Disposition: A | Discharge status: Home / Self Care | Payer: Medicare HMO | Source: Ambulatory Visit | Attending: Adult Health | Admitting: Adult Health

## 2019-02-08 ENCOUNTER — Encounter (HOSPITAL_COMMUNITY): Payer: Self-pay

## 2019-02-08 ENCOUNTER — Other Ambulatory Visit: Payer: Self-pay

## 2019-02-08 VITALS — BP 136/59 | HR 64 | Wt 275.8 lb

## 2019-02-08 DIAGNOSIS — I4891 Unspecified atrial fibrillation: Secondary | ICD-10-CM | POA: Insufficient documentation

## 2019-02-08 DIAGNOSIS — Z79890 Hormone replacement therapy (postmenopausal): Secondary | ICD-10-CM | POA: Diagnosis not present

## 2019-02-08 DIAGNOSIS — F419 Anxiety disorder, unspecified: Secondary | ICD-10-CM | POA: Insufficient documentation

## 2019-02-08 DIAGNOSIS — D649 Anemia, unspecified: Secondary | ICD-10-CM | POA: Insufficient documentation

## 2019-02-08 DIAGNOSIS — Z7901 Long term (current) use of anticoagulants: Secondary | ICD-10-CM | POA: Insufficient documentation

## 2019-02-08 DIAGNOSIS — Z809 Family history of malignant neoplasm, unspecified: Secondary | ICD-10-CM | POA: Insufficient documentation

## 2019-02-08 DIAGNOSIS — Z86718 Personal history of other venous thrombosis and embolism: Secondary | ICD-10-CM | POA: Insufficient documentation

## 2019-02-08 DIAGNOSIS — G4730 Sleep apnea, unspecified: Secondary | ICD-10-CM

## 2019-02-08 DIAGNOSIS — G4733 Obstructive sleep apnea (adult) (pediatric): Secondary | ICD-10-CM | POA: Diagnosis not present

## 2019-02-08 DIAGNOSIS — Z8612 Personal history of poliomyelitis: Secondary | ICD-10-CM | POA: Insufficient documentation

## 2019-02-08 DIAGNOSIS — M19032 Primary osteoarthritis, left wrist: Secondary | ICD-10-CM | POA: Insufficient documentation

## 2019-02-08 DIAGNOSIS — Z8249 Family history of ischemic heart disease and other diseases of the circulatory system: Secondary | ICD-10-CM | POA: Insufficient documentation

## 2019-02-08 DIAGNOSIS — E669 Obesity, unspecified: Secondary | ICD-10-CM | POA: Diagnosis not present

## 2019-02-08 DIAGNOSIS — E039 Hypothyroidism, unspecified: Secondary | ICD-10-CM | POA: Insufficient documentation

## 2019-02-08 DIAGNOSIS — G8929 Other chronic pain: Secondary | ICD-10-CM | POA: Diagnosis not present

## 2019-02-08 DIAGNOSIS — N189 Chronic kidney disease, unspecified: Secondary | ICD-10-CM | POA: Diagnosis not present

## 2019-02-08 DIAGNOSIS — M109 Gout, unspecified: Secondary | ICD-10-CM | POA: Diagnosis not present

## 2019-02-08 DIAGNOSIS — I10 Essential (primary) hypertension: Secondary | ICD-10-CM

## 2019-02-08 DIAGNOSIS — Z6841 Body Mass Index (BMI) 40.0 and over, adult: Secondary | ICD-10-CM | POA: Diagnosis not present

## 2019-02-08 DIAGNOSIS — Z885 Allergy status to narcotic agent status: Secondary | ICD-10-CM | POA: Insufficient documentation

## 2019-02-08 DIAGNOSIS — E1122 Type 2 diabetes mellitus with diabetic chronic kidney disease: Secondary | ICD-10-CM | POA: Insufficient documentation

## 2019-02-08 DIAGNOSIS — M170 Bilateral primary osteoarthritis of knee: Secondary | ICD-10-CM | POA: Diagnosis not present

## 2019-02-08 DIAGNOSIS — M1611 Unilateral primary osteoarthritis, right hip: Secondary | ICD-10-CM | POA: Diagnosis not present

## 2019-02-08 DIAGNOSIS — I130 Hypertensive heart and chronic kidney disease with heart failure and stage 1 through stage 4 chronic kidney disease, or unspecified chronic kidney disease: Secondary | ICD-10-CM | POA: Insufficient documentation

## 2019-02-08 DIAGNOSIS — M47819 Spondylosis without myelopathy or radiculopathy, site unspecified: Secondary | ICD-10-CM | POA: Insufficient documentation

## 2019-02-08 DIAGNOSIS — Z87891 Personal history of nicotine dependence: Secondary | ICD-10-CM | POA: Insufficient documentation

## 2019-02-08 DIAGNOSIS — M19012 Primary osteoarthritis, left shoulder: Secondary | ICD-10-CM | POA: Insufficient documentation

## 2019-02-08 DIAGNOSIS — I5032 Chronic diastolic (congestive) heart failure: Secondary | ICD-10-CM | POA: Diagnosis not present

## 2019-02-08 DIAGNOSIS — Z86711 Personal history of pulmonary embolism: Secondary | ICD-10-CM | POA: Insufficient documentation

## 2019-02-08 DIAGNOSIS — Z79899 Other long term (current) drug therapy: Secondary | ICD-10-CM | POA: Insufficient documentation

## 2019-02-08 DIAGNOSIS — F329 Major depressive disorder, single episode, unspecified: Secondary | ICD-10-CM | POA: Diagnosis not present

## 2019-02-08 DIAGNOSIS — E7800 Pure hypercholesterolemia, unspecified: Secondary | ICD-10-CM | POA: Insufficient documentation

## 2019-02-08 DIAGNOSIS — Z794 Long term (current) use of insulin: Secondary | ICD-10-CM | POA: Insufficient documentation

## 2019-02-08 LAB — BASIC METABOLIC PANEL
Anion gap: 8 (ref 5–15)
BUN: 28 mg/dL — ABNORMAL HIGH (ref 8–23)
CO2: 30 mmol/L (ref 22–32)
Calcium: 8.9 mg/dL (ref 8.9–10.3)
Chloride: 103 mmol/L (ref 98–111)
Creatinine, Ser: 1.54 mg/dL — ABNORMAL HIGH (ref 0.44–1.00)
GFR calc Af Amer: 41 mL/min — ABNORMAL LOW (ref >60)
GFR calc non Af Amer: 36 mL/min — ABNORMAL LOW (ref >60)
Glucose, Bld: 96 mg/dL (ref 70–99)
Potassium: 4.1 mmol/L (ref 3.5–5.1)
Sodium: 141 mmol/L (ref 135–145)

## 2019-02-08 MED ORDER — POTASSIUM CHLORIDE CRYS ER 20 MEQ PO TBCR: 40.0000 meq | EXTENDED_RELEASE_TABLET | Freq: Every day | ORAL | 3 refills | Status: DC

## 2019-02-08 NOTE — Progress Notes (Signed)
Advanced Heart Failure Clinic Note   Referring Physician: PCP: [name], MD PCP-Cardiologist: No primary care provider on file.   HPI: [name] is a 63 y/o female w/ h/o obesity, HTN, DM, atrial fib, DVT, PE x3 , s/p IVC filter placed in 2013, CHB s/p MDT single chamber PPM 08/2018, and chronic diastolic heart failure. In 2013, she had sleep study with mild sleep apnea noted.   R/LHC in 2016 showed no significant coronary diseased, cardiac output 8.9 and PA pressured 41/13.    In January 2020 she had presyncopal episode and had some intermittent dizziness.  She told her PCP and was instructed to keep a log. In May she had presyncope and a syncopal episode. She had a fall off her porch. This was determined to be in the setting of CHB. MDT single chamber PPM placed. She also had minimally displaced C6 fracture.  She has functional limitation at baseline due to chronic knee pain. She uses a walker for ambulation.    She recently presented to [facility] on 12/26/18  w/ dyspnea and cough. Initial w/u in the ED c/w acute on chronic diastolic HF. Admitted and started on IV diuretics. 2D echo showed EF 65-70% w/ pseudonormalization of LV consistent with diastolic dysfunction. She was diuresed w/ Lasix. RHC completed with preserved cardiac output and low filling pressures. PYP scan not convincing for amyloid. Outpatient sleep study recommended. EP saw 9/22 reprogrammed to VVI 50 due to asynchronous V pacing.Prior to d/c was transitioned to PO torsemide 20 mg daily. Also sent home on Hydralazine 25 mg three times a day, Imdur 60 mg daily, Crestor 10 mg daily. Her discharge wt was 117 kg (257 lb).  Today she returns for HF follow up. She was seen 2 weeks ago and instructed to increase torsemide to 40 mg daily. Overall feeling fine. SOB with moderate exertion. Denies PND/Orthopnea. Daytime fatigue. Appetite ok. Drinks lots of fluids.No fever or chills. Weight at home trending up 258--->270   pounds.  Taking all medications.  Echo 12/27/18  1. Left ventricular ejection fraction, by visual estimation, is 65 to 70%. The left ventricle has normal function. Normal left ventricular size. There is moderately increased left ventricular hypertrophy. 2. Left ventricular diastolic Doppler parameters are consistent with pseudonormalization pattern of LV diastolic filling. 3. Global right ventricle has normal systolic function.The right ventricular size is normal. No increase in right ventricular wall thickness. 4. Left atrial size was mildly dilated. 5. Right atrial size was normal. 6. Mild to moderate aortic valve annular calcification. 7. Moderate to severe mitral annular calcification. 8. The mitral valve is normal in structure. No evidence of mitral valve regurgitation. No evidence of mitral stenosis. 9. The tricuspid valve is normal in structure. Tricuspid valve regurgitation was not visualized by color flow Doppler. 10. The aortic valve is normal in structure. Aortic valve regurgitation was not visualized by color flow Doppler. Mild to moderate aortic valve sclerosis/calcification without any evidence of aortic stenosis. 11. The pulmonic valve was normal in structure. Pulmonic valve regurgitation is not visualized by color flow Doppler. 12. Normal pulmonary artery systolic pressure. 13. A pacer wire is visualized. 14. The inferior vena cava is normal in size with greater than 50% respiratory variability, suggesting right atrial pressure of 3 mmHg.   Past Medical History:  Diagnosis Date  . A-fib ([place])   . Anemia   . Anxiety   . Arthritis    "right hip; both knees; left wrist/shoulder; back" (01/19/2013"  . Bleeding on  Coumadin 08/2012; 01/18/2013   BRBPR admissions (01/19/2013)  . CHF (congestive heart failure) ([place])    "2-3 times" (01/19/2013)  . Chronic lower back pain   . Depression   . DVT (deep venous thrombosis) ([place]) 10 years ago   numerous/notes 01/18/2013  . GERD  (gastroesophageal reflux disease)   . Gout   . KQ:[id number])    "maybe weekly" (01/19/2013)  . Heart murmur   . High cholesterol    "been off RX for this at one time" (01/18/2013)  . History of blood transfusion 1983; 04/2012   "3 w/ childbirth; hospitalized for pain" (01/19/2013)  . Hypertension   . Hypothyroidism   . Migraines    "twice/yr maybe" (01/19/2013)  . OSA (obstructive sleep apnea)    "sent me for test in 04/2012; never ordered mask, etc" (01/19/2013)  . PE (pulmonary thromboembolism) ([place]) 3 years ag0   3/notes 01/18/2013  . Pneumonia before 2011   "once' (01/18/2013)  . Renal disorder    kindey function low; "Metformin was destroying my kidneys" (01/19/2013)  . Shortness of breath    "only related to my CHF" (01/18/2013)  . Swelling of hand 08/31/2014   RT HAND  . Type II diabetes mellitus ([place])   . UTI (urinary tract infection) 08/31/2014    Current Outpatient Medications  Medication Sig Dispense Refill  . acetaminophen (TYLENOL) 500 MG tablet Take 500 mg by mouth every 6 (six) hours as needed for fever or headache (pain).    Marland Kitchen allopurinol (ZYLOPRIM) 300 MG tablet Take 300 mg by mouth daily.    . Biotin 1000 MCG tablet Take 500 mcg by mouth daily with lunch.     . Calcium Citrate-Vitamin D (CITRACAL/VITAMIN D PO) Take 2 tablets by mouth every evening.    . citalopram (CELEXA) 20 MG tablet Take 20 mg by mouth at bedtime.    . ferrous sulfate 325 (65 FE) MG tablet Take 325 mg by mouth daily.    . insulin aspart (NOVOLOG) 100 UNIT/ML injection Inject 2 Units into the skin 3 (three) times daily with meals. (Patient taking differently: Inject 8 Units into the skin 3 (three) times daily as needed for high blood sugar (CBG >160). ) 10 mL 11  . Insulin Detemir (LEVEMIR FLEXTOUCH) 100 UNIT/ML Pen Inject 20 Units into the skin daily before breakfast.     . isosorbide mononitrate (IMDUR) 60 MG 24 hr tablet Take 1 tablet (60 mg total) by mouth daily. 90 tablet 0  .  levothyroxine (SYNTHROID) 75 MCG tablet Take 75 mcg by mouth daily before breakfast.     . Melatonin 5 MG TABS Take 10 mg by mouth at bedtime.     . Multiple Vitamin (MULITIVITAMIN WITH MINERALS) TABS Take 1 tablet by mouth daily.    . Omega-3 Fatty Acids (FISH OIL) 1000 MG CAPS Take 1,000 mg by mouth daily.     . potassium chloride SA (KLOR-CON) 20 MEQ tablet Take 1 tablet (20 mEq total) by mouth daily. 90 tablet 3  . rosuvastatin (CRESTOR) 10 MG tablet Take 1 tablet (10 mg total) by mouth daily. (Patient taking differently: Take 10 mg by mouth at bedtime. ) 30 tablet 0  . Semaglutide,0.25 or 0.5MG /DOS, (OZEMPIC, 0.25 OR 0.5 MG/DOSE,) 2 MG/1.5ML SOPN Inject 0.5 mg into the skin once a week.    . torsemide (DEMADEX) 20 MG tablet Take 2 tablets (40 mg total) by mouth daily. 60 tablet 3  . warfarin (COUMADIN) 2.5 MG tablet TAKE 7.5 MG ON  M-W-F AND 5 MG TUES, THURS, SAT AND SUN AS DIRECTED (Patient taking differently: Take 5-7.5 mg by mouth See admin instructions. Take 2 tablets (5 mg) by mouth on Monday,Wednesday, Friday, Saturday and Sunday. Take 3 tablets (7.5 mg) on Tuesday and Thursday.) 120 tablet 1   No current facility-administered medications for this encounter.     Allergies  Allergen Reactions  . Morphine And Related Rash      Social History   Socioeconomic History  . Marital status: Single    Spouse name: Not on file  . Number of children: Not on file  . Years of education: Not on file  . Highest education level: Not on file  Occupational History  . Occupation: disabled  Social Needs  . Financial resource strain: Not on file  . Food insecurity    Worry: Not on file    Inability: Not on file  . Transportation needs    Medical: Not on file    Non-medical: Not on file  Tobacco Use  . Smoking status: Former Smoker    Packs/day: 0.05    Years: 30.00    Pack years: 1.50    Types: Cigarettes    Quit date: 1990    Years since quitting: 30.8  . Smokeless tobacco: Never  Used  . Tobacco comment: 01/19/2013 "quit smoking cigarettes in the early '90's"  Substance and Sexual Activity  . Alcohol use: Not Currently    Comment: rare now but never a heavy drinker  . Drug use: No  . Sexual activity: Never  Lifestyle  . Physical activity    Days per week: Not on file    Minutes per session: Not on file  . Stress: Not on file  Relationships  . Social Herbalist on phone: Not on file    Gets together: Not on file    Attends religious service: Not on file    Active member of club or organization: Not on file    Attends meetings of clubs or organizations: Not on file    Relationship status: Not on file  . Intimate partner violence    Fear of current or ex partner: Not on file    Emotionally abused: Not on file    Physically abused: Not on file    Forced sexual activity: Not on file  Other Topics Concern  . Not on file  Social History Narrative  . Not on file      Family History  Problem Relation Age of Onset  . Cerebral aneurysm Mother   . Hypertension Father   . Cerebral aneurysm Maternal Grandfather   . Cerebral aneurysm Maternal Aunt   . Cancer Maternal Uncle     Vitals:   02/08/19 1355  BP: (!) 136/59  Pulse: 64  SpO2: 99%  Weight: 125.1 kg (275 lb 12.8 oz)   Wt Readings from Last 3 Encounters:  02/08/19 125.1 kg (275 lb 12.8 oz)  01/19/19 123.1 kg (271 lb 6.4 oz)  12/31/18 117.6 kg (259 lb 3.2 oz)    PHYSICAL EXAM: General:  No resp difficulty. Ambulated in the clinic with cane. HEENT: normal Neck: supple. JVP 10-11. Carotids 2+ bilat; no bruits. No lymphadenopathy or thryomegaly appreciated. Cor: PMI nondisplaced. Regular rate & rhythm. No rubs, gallops or murmurs. Lungs: clear Abdomen: obese, soft, nontender, nondistended. No hepatosplenomegaly. No bruits or masses. Good bowel sounds. Extremities: no cyanosis, clubbing, rash, R and LLE 2+ edema Neuro: alert & orientedx3, cranial nerves grossly intact.  moves all 4  extremities w/o difficulty. Affect pleasant  ASSESSMENT & PLAN: 1. Chronic Diastolic Heart Failure: ECHO 12/2017 EF 65-70% , moderately increased LVH. [place] 12/2018 with preserved cardiac output and low filling pressures. PYP scan not convincing for amyloid. Discharged home on 20 mg of torsemide. - NYHA III. Volume status elevated in the setting of increased fluid intake.  - Increase torsemide to 40 mg twice a day x 2 days then cut back torsemide to 40 mg daily. -Increase K to 40 meq daily.  -Continue Imdur  for BP control -Will arrange sleep study to r/o OSA . Set up today.  - Discussed limiting fluid intake to < 2 liter per day. Discussed low salt food choices.   2.CKD -Baseline creatinine is ~1.6-1.8  -Check BMET   4. HTN: Stable.   4.H/O CHB with MDT PPM-  - EP reprogrammed  to VVI 50   5. DM:  - On insulin.  - Followed by PCP   6. Hypothyroidism  -On levothyroxine. Followed by PCP  7. H/O PEs: - On coumadin.  8. Obesity Body mass index is 50.44 kg/m. Discussed portion control.   Follow up in 2 weeks to reassess volume status. Check BMET  Greater than 50% of the (total minutes 25) visit spent in counseling/coordination of care regarding the above.     Darrick Grinder, NP 02/08/19

## 2019-02-08 NOTE — Patient Instructions (Signed)
Increase Torsemide to 40 mg (2 tabs) Twice daily FOR 2 DAYS ONLY, then back to 40 mg DAILY  Increase Potassium to 40 meq (2 tabs) DAILY  Labs done today, we will notify you of any abnormal results  Your provider has recommended that you have a home sleep study.  [employer] is the company that does these test.  They will contact you by phone and must speak with you before they can ship the equipment.  Once they have spoken with you they will send the equipment right to your home with instructions on how to set it up.  Once you have completed the test simply box all the equipment back up and mail back to the company.  IF you have any questions or issues with the equipment please call the company directly at [phone].  If your test is positive for sleep apnea and you need a home CPAP machine you will be contacted by Dr Theodosia Blender office [facility]) to set this up.  Limit your fluid intake to less than 2 liters of fluid per day. Fluid includes all drinks, coffee, juice, ice chips, soup, jello, and all other liquids.  Your physician recommends that you schedule a follow-up appointment in: 2 weeks  If you have any questions or concerns before your next appointment please send [place] a message through [place] or call our office at [phone].  At the [facility], you and your health needs are our priority. As part of our continuing mission to provide you with exceptional heart care, we have created designated Provider Care Teams. These Care Teams include your primary Cardiologist (physician) and Advanced Practice Providers (APPs- Physician Assistants and Nurse Practitioners) who all work together to provide you with the care you need, when you need it.   You may see any of the following providers on your designated Care Team at your next follow up: Marland Kitchen Dr Glori Bickers . Dr Loralie Champagne . Darrick Grinder, NP . Lyda Jester, PA   Please be sure to bring in all your medications  bottles to every appointment.

## 2019-02-11 NOTE — Progress Notes (Signed)
Order, OV note, stop bang and demographics all faxed to [employer] at [phone] via epic  

## 2019-02-11 NOTE — Addendum Note (Signed)
Encounter addended by: Scarlette Calico, RN on: 02/11/2019 4:22 PM  Actions taken: Clinical Note Signed

## 2019-02-14 ENCOUNTER — Telehealth (HOSPITAL_COMMUNITY): Payer: Self-pay

## 2019-02-14 NOTE — Progress Notes (Signed)
Cardiology Office Note Date:  02/21/2019  Patient ID:  [name], [name] 05/22/1955, MRN [id number] PCP:  [name], MD  Cardiologist:  Dr. Sallyanne Kuster Electrophysiologist: Dr. Rayann Heman    Chief Complaint: planned post hospital follow up  History of Present Illness: [name] is a 63 y.o. female with history of HTN, HLD,b/l DVT's w/recurrent PE (x3) (has IVC filter) and chronic diastolic dysfunction, morbid obesity, atypical AFlutter with SVR/advanced heart block underwent PPM implant in May 2020.  Aug 30, 2018 was admitted after seizure episode resulting in a fall and broken neck requiring weeks of hard neck collar support, she was evaluated by EP service for prolonged pauses on telemetry and bradycardia with rates to 30's.   She was initially planned for leadless pacer though given IVC filter unable and given felt to have permanent AFlutter with EKGs back to 2018 noting her flutter and underwent single chamber PPM implant on 08/31/2018, MDT device  admitted to [facility] 12/26/18 with progressive cough, SOB and reports of CP. Cardiology service has been consulted for diastolic CHF, CP with mildly elevated HS Trop (in the environment of AKI on CKD), she was given IV lasix, started on antibiotics for suspect UTI and was admitted by medicine service.  AHF team participated in management of her diastolic HF. EP was called she was noted to have been in SR with intermittent asynchronous V pacing, given this her device was programmed VVI 50 with intact SR/AV conduction  She continues to have trouble with volume OL has been following with the AHF team since her discharge and has another visit today after here. She denies any CP or palpitations.  She does have dizzy spells, these described as spinning sensation particularly when changing positions including going from seated to supine in bed at night.  This a sensation of movement/spinning.  She says this has been a problem ever since her neck  injury.  She has less frequent dizzy spells when standing that feel more like sudden weakness, no syncope, but has been lightheaded with these a few times.   No SOB at rest + DOE, "about the same"  No bleeding or signs of bleeding, monitors her warfarin with her PMD  Device information MDT single chamber PPM, implanted 08/31/18    Past Medical History:  Diagnosis Date  . A-fib ([place])   . Anemia   . Anxiety   . Arthritis    "right hip; both knees; left wrist/shoulder; back" (01/19/2013"  . Bleeding on Coumadin 08/2012; 01/18/2013   [facility] admissions (01/19/2013)  . CHF (congestive heart failure) ([place])    "2-3 times" (01/19/2013)  . Chronic lower back pain   . Depression   . DVT (deep venous thrombosis) ([place]) 10 years ago   numerous/notes 01/18/2013  . GERD (gastroesophageal reflux disease)   . Gout   . [id number])    "maybe weekly" (01/19/2013)  . Heart murmur   . High cholesterol    "been off RX for this at one time" (01/18/2013)  . History of blood transfusion 1983; 04/2012   "3 w/ childbirth; hospitalized for pain" (01/19/2013)  . Hypertension   . Hypothyroidism   . Migraines    "twice/yr maybe" (01/19/2013)  . OSA (obstructive sleep apnea)    "sent me for test in 04/2012; never ordered mask, etc" (01/19/2013)  . PE (pulmonary thromboembolism) ([place]) 3 years ag0   3/notes 01/18/2013  . Pneumonia before 2011   "once' (01/18/2013)  . Renal disorder    kindey function  low; "Metformin was destroying my kidneys" (01/19/2013)  . Shortness of breath    "only related to my CHF" (01/18/2013)  . Swelling of hand 08/31/2014   RT HAND  . Type II diabetes mellitus ([place])   . UTI (urinary tract infection) 08/31/2014    Past Surgical History:  Procedure Laterality Date  . CARDIAC CATHETERIZATION N/A 03/13/2015   Procedure: Right/Left Heart Cath and Coronary Angiography;  Surgeon: Adrian Prows, MD;  Location: [place] [facility];  Service: Cardiovascular;  Laterality: N/A;  .  CATARACT EXTRACTION W/ INTRAOCULAR LENS  IMPLANT, BILATERAL Bilateral 2006-2011  . CESAREAN SECTION  1983  . CHOLECYSTECTOMY  ~ 2002  . COLONOSCOPY N/A 01/21/2013   Procedure: COLONOSCOPY;  Surgeon: Beryle Beams, MD;  Location: [facility];  Service: Endoscopy;  Laterality: N/A;  . EYE SURGERY Bilateral    "multiple" (01/18/2013)  . PACEMAKER IMPLANT N/A 08/31/2018   Symptomatic bradycardia due to mobitz II second degree AV block, permanent afib/ atypical atrial flutter implanted by Dr Rayann Heman  . PARS PLANA REPAIR OF RETINAL DEATACHMENT Right   . PARS PLANA VITRECTOMY Bilateral 2004-2006   "several" (01/18/2013)  . REFRACTIVE SURGERY Bilateral    "for stigmatism" (01/18/2013)  . REFRACTIVE SURGERY Left ~ 11/2012   "to puff it up cause vision got hazy" (01/18/2013)  . RIGHT HEART CATH N/A 12/30/2018   Procedure: RIGHT HEART CATH;  Surgeon: Jolaine Artist, MD;  Location: [facility];  Service: Cardiovascular;  Laterality: N/A;  . VENA CAVA FILTER PLACEMENT  2011?    Current Outpatient Medications  Medication Sig Dispense Refill  . acetaminophen (TYLENOL) 500 MG tablet Take 500 mg by mouth every 6 (six) hours as needed for fever or headache (pain).    Marland Kitchen allopurinol (ZYLOPRIM) 300 MG tablet Take 300 mg by mouth daily.    . Biotin 1000 MCG tablet Take 500 mcg by mouth daily with lunch.     . Calcium Citrate-Vitamin D (CITRACAL/VITAMIN D PO) Take 2 tablets by mouth every evening.    . citalopram (CELEXA) 20 MG tablet Take 20 mg by mouth at bedtime.    . ferrous sulfate 325 (65 FE) MG tablet Take 325 mg by mouth daily.    . insulin aspart (NOVOLOG) 100 UNIT/ML injection Inject 2 Units into the skin 3 (three) times daily with meals. (Patient taking differently: Inject 8 Units into the skin 3 (three) times daily as needed for high blood sugar (CBG >160). ) 10 mL 11  . Insulin Detemir (LEVEMIR FLEXTOUCH) 100 UNIT/ML Pen Inject 20 Units into the skin daily before breakfast.     .  isosorbide mononitrate (IMDUR) 60 MG 24 hr tablet Take 1 tablet (60 mg total) by mouth daily. 90 tablet 0  . levothyroxine (SYNTHROID) 75 MCG tablet Take 75 mcg by mouth daily before breakfast.     . Melatonin 5 MG TABS Take 10 mg by mouth at bedtime.     . Multiple Vitamin (MULITIVITAMIN WITH MINERALS) TABS Take 1 tablet by mouth daily.    . Omega-3 Fatty Acids (FISH OIL) 1000 MG CAPS Take 1,000 mg by mouth daily.     . potassium chloride SA (KLOR-CON) 20 MEQ tablet Take 2 tablets (40 mEq total) by mouth daily. 180 tablet 3  . rosuvastatin (CRESTOR) 10 MG tablet Take 1 tablet (10 mg total) by mouth daily. (Patient taking differently: Take 10 mg by mouth at bedtime. ) 30 tablet 0  . Semaglutide,0.25 or 0.5MG /DOS, (OZEMPIC, 0.25 OR 0.5  MG/DOSE,) 2 MG/1.5ML SOPN Inject 0.5 mg into the skin once a week.    . torsemide (DEMADEX) 20 MG tablet Take 2 tablets (40 mg total) by mouth daily. 60 tablet 3  . warfarin (COUMADIN) 2.5 MG tablet TAKE 7.5 MG ON M-W-F AND 5 MG TUES, THURS, SAT AND SUN AS DIRECTED (Patient taking differently: Take 5-7.5 mg by mouth See admin instructions. Take 2 tablets (5 mg) by mouth on Monday,Wednesday, Friday, Saturday and Sunday. Take 3 tablets (7.5 mg) on Tuesday and Thursday.) 120 tablet 1   No current facility-administered medications for this visit.     Allergies:   Morphine and related   Social History:  The patient  reports that she quit smoking about 30 years ago. Her smoking use included cigarettes. She has a 1.50 pack-year smoking history. She has never used smokeless tobacco. She reports previous alcohol use. She reports that she does not use drugs.   Family History:  The patient's family history includes Cancer in her maternal uncle; Cerebral aneurysm in her maternal aunt, maternal grandfather, and mother; Hypertension in her father.  ROS:  Please see the history of present illness.  All other systems are reviewed and otherwise negative.   PHYSICAL EXAM:  VS:   BP (!) 144/72   Pulse 69   Ht 5\' 2"  (1.575 m)   Wt 276 lb (125.2 kg)   SpO2 99%   BMI 50.48 kg/m  BMI: Body mass index is 50.48 kg/m. Well nourished, well developed, in no acute distress  HEENT: normocephalic, atraumatic  Neck: no JVD, carotid bruits or masses Cardiac:  irreg-irreg; 1-2 SM, no rubs, or gallops Lungs:  CTA b/l, no wheezing, rhonchi or rales  Abd: soft, nontender, obese, large asymmetrical pannus R.L, she reports as her usual MS: no deformity or atrophy Ext:  1-2* edema, to just below knees, chronic looking skin changes b/l LE, she has a wrap on her L ankle she reports is covering a blister that opened  Skin: warm and dry, no rash Neuro:  No gross deficits appreciated Psych: euthymic mood, full affect  PPM site is stable, no tethering or discomfort   EKG:  Not done today PPM interrogation done today and reviewed by myself:  Battery and lead measurements are good She is in AFib 70's VS 94.3% VP 5.7%      Nm Cardiac Amyloid Tumor Loc Inflam Spect 1 Day  Result Date: 12/30/2018  CLINICAL DATA:  HEART FAILURE. CONCERN FOR CARDIAC AMYLOIDOSIS. EXAM: NUCLEAR MEDICINE TUMOR LOCALIZATION. PYP CARDIAC AMYLOIDOSIS SCAN WITH SPECT TECHNIQUE: Following intravenous administration of radiopharmaceutical, anterior planar images of the chest were obtained. Regions of interest were placed on the heart and contralateral chest wall for quantitative assessment. Additional SPECT imaging of the chest was obtained. RADIOPHARMACEUTICALS:  8 mCi TECHNETIUM 99 PYROPHOSPHATE FINDINGS: Planar Visual assessment: Anterior planar imaging demonstrates radiotracer uptake within the heart less than than uptake within the adjacent ribs (Grade 1). Quantitative assessment : Quantitative assessment of the cardiac uptake compared to the contralateral chest wall is equal to 1.3 (H/CL = 1.3). SPECT assessment: SPECT imaging of the chest demonstrates minimal radiotracer accumulation within the LEFT  ventricle. IMPRESSION: Visual and quantitative assessment (grade 1, H/CLL equal 1.3) are NOT suggestive of transthyretin amyloidosis. Electronically Signed   By: Suzy Bouchard M.D.   On: 12/30/2018 16:18    12/27/2018: TTE IMPRESSIONS 1. Left ventricular ejection fraction, by visual estimation, is 65 to 70%. The left ventricle has normal function. Normal left ventricular size.  There is moderately increased left ventricular hypertrophy. 2. Left ventricular diastolic Doppler parameters are consistent with pseudonormalization pattern of LV diastolic filling. 3. Global right ventricle has normal systolic function.The right ventricular size is normal. No increase in right ventricular wall thickness. 4. Left atrial size was mildly dilated. 5. Right atrial size was normal. 6. Mild to moderate aortic valve annular calcification. 7. Moderate to severe mitral annular calcification. 8. The mitral valve is normal in structure. No evidence of mitral valve regurgitation. No evidence of mitral stenosis. 9. The tricuspid valve is normal in structure. Tricuspid valve regurgitation was not visualized by color flow Doppler. 10. The aortic valve is normal in structure. Aortic valve regurgitation was not visualized by color flow Doppler. Mild to moderate aortic valve sclerosis/calcification without any evidence of aortic stenosis. 11. The pulmonic valve was normal in structure. Pulmonic valve regurgitation is not visualized by color flow Doppler. 12. Normal pulmonary artery systolic pressure. 13. A pacer wire is visualized. 14. The inferior vena cava is normal in size with greater than 50% respiratory variability, suggesting right atrial pressure of 3 mmHg.    05/08/16 TTE Study Conclusions - Left ventricle: The cavity size was normal. Wall thickness was increased in a pattern of severe LVH. Systolic function was normal. The estimated ejection fraction was in the range of 50% to 55%. Wall  motion was normal; there were no regional wall motion abnormalities. The study is not technically sufficient to allow evaluation of LV diastolic function. - Aortic valve: There was trivial regurgitation. - Mitral valve: Calcified annulus. There was mild regurgitation. - Left atrium: The atrium was severely dilated. - Right ventricle: The cavity size was mildly dilated. - Right atrium: The atrium was mildly dilated. - Pericardium, extracardiac: A trivial pericardial effusion was identified. Impressions: - Normal LV function; severe LVH; calcified aortic valve with trace AI; mild MR; severe LAE; mild RAE and RVE; trace TR.   Recent Labs: 08/29/2018: ALT 27; TSH 1.773 12/26/2018: B Natriuretic Peptide 430.9 12/27/2018: Platelets 162 12/30/2018: Hemoglobin 11.9 02/08/2019: BUN 28; Creatinine, Ser 1.54; Potassium 4.1; Sodium 141  08/30/2018: Cholesterol 107; HDL 49; LDL Cholesterol 48; Total CHOL/HDL Ratio 2.2; Triglycerides 48; VLDL 10   Estimated Creatinine Clearance: 47.9 mL/min (A) (by C-G formula based on SCr of 1.54 mg/dL (H)).   Wt Readings from Last 3 Encounters:  02/21/19 276 lb (125.2 kg)  02/08/19 275 lb 12.8 oz (125.1 kg)  01/19/19 271 lb 6.4 oz (123.1 kg)     Other studies reviewed: Additional studies/records reviewed today include: summarized above  ASSESSMENT AND PLAN:  1. PPM     Intact function, no programming changes made     Minimal pacing only 5.7%  2. Atypical AFlutter, AFib, longstanding persistent     CHA2DS2Vasc is 4, on warfarin, monitored and managed by her PMD     Her AFib felt to have been permanent at time of implant     At her last hospital stay she was in SR     AFib today, rates are controlled  3. Chronic Diastolic CHF     She sees AHF team today following our visit     Will defer medicines, management to them    Disposition: F/u with Q 3 mo remotes and in-clinic pacer check 1 year, sooner if needed.    Current medicines are  reviewed at length with the patient today.  The patient did not have any concerns regarding medicines.  Venetia Night, PA-C 02/21/2019 2:00 PM  [address]  [place] [phone] (office)  [phone] (fax)

## 2019-02-14 NOTE — Telephone Encounter (Signed)
Faxed sleep study, patient demographics, signed order and medication list to [facility] @ [phone].

## 2019-02-21 ENCOUNTER — Ambulatory Visit: Payer: Medicare HMO | Admitting: Physician Assistant

## 2019-02-21 ENCOUNTER — Encounter (HOSPITAL_COMMUNITY): Payer: Self-pay

## 2019-02-21 ENCOUNTER — Ambulatory Visit (HOSPITAL_COMMUNITY)
Admission: RE | Admit: 2019-02-21 | Discharge: 2019-02-21 | Disposition: A | Discharge status: Home / Self Care | Payer: Medicare HMO | Source: Ambulatory Visit | Attending: Cardiology | Admitting: Cardiology

## 2019-02-21 ENCOUNTER — Other Ambulatory Visit: Payer: Self-pay

## 2019-02-21 VITALS — BP 144/72 | HR 69 | Ht 62.0 in | Wt 276.0 lb

## 2019-02-21 VITALS — BP 136/76 | HR 76 | Wt 274.8 lb

## 2019-02-21 DIAGNOSIS — M25569 Pain in unspecified knee: Secondary | ICD-10-CM | POA: Insufficient documentation

## 2019-02-21 DIAGNOSIS — E1122 Type 2 diabetes mellitus with diabetic chronic kidney disease: Secondary | ICD-10-CM | POA: Insufficient documentation

## 2019-02-21 DIAGNOSIS — R0683 Snoring: Secondary | ICD-10-CM | POA: Diagnosis not present

## 2019-02-21 DIAGNOSIS — E669 Obesity, unspecified: Secondary | ICD-10-CM | POA: Insufficient documentation

## 2019-02-21 DIAGNOSIS — I10 Essential (primary) hypertension: Secondary | ICD-10-CM

## 2019-02-21 DIAGNOSIS — Z950 Presence of cardiac pacemaker: Secondary | ICD-10-CM | POA: Diagnosis not present

## 2019-02-21 DIAGNOSIS — Z794 Long term (current) use of insulin: Secondary | ICD-10-CM | POA: Insufficient documentation

## 2019-02-21 DIAGNOSIS — I130 Hypertensive heart and chronic kidney disease with heart failure and stage 1 through stage 4 chronic kidney disease, or unspecified chronic kidney disease: Secondary | ICD-10-CM | POA: Diagnosis not present

## 2019-02-21 DIAGNOSIS — F419 Anxiety disorder, unspecified: Secondary | ICD-10-CM | POA: Diagnosis not present

## 2019-02-21 DIAGNOSIS — F329 Major depressive disorder, single episode, unspecified: Secondary | ICD-10-CM | POA: Diagnosis not present

## 2019-02-21 DIAGNOSIS — Z87891 Personal history of nicotine dependence: Secondary | ICD-10-CM | POA: Diagnosis not present

## 2019-02-21 DIAGNOSIS — E039 Hypothyroidism, unspecified: Secondary | ICD-10-CM | POA: Diagnosis not present

## 2019-02-21 DIAGNOSIS — M109 Gout, unspecified: Secondary | ICD-10-CM | POA: Insufficient documentation

## 2019-02-21 DIAGNOSIS — R5383 Other fatigue: Secondary | ICD-10-CM | POA: Diagnosis not present

## 2019-02-21 DIAGNOSIS — Z86711 Personal history of pulmonary embolism: Secondary | ICD-10-CM | POA: Diagnosis not present

## 2019-02-21 DIAGNOSIS — N1832 Chronic kidney disease, stage 3b: Secondary | ICD-10-CM | POA: Insufficient documentation

## 2019-02-21 DIAGNOSIS — M1990 Unspecified osteoarthritis, unspecified site: Secondary | ICD-10-CM | POA: Insufficient documentation

## 2019-02-21 DIAGNOSIS — Z79899 Other long term (current) drug therapy: Secondary | ICD-10-CM | POA: Diagnosis not present

## 2019-02-21 DIAGNOSIS — E7800 Pure hypercholesterolemia, unspecified: Secondary | ICD-10-CM | POA: Diagnosis not present

## 2019-02-21 DIAGNOSIS — I4892 Unspecified atrial flutter: Secondary | ICD-10-CM

## 2019-02-21 DIAGNOSIS — I5033 Acute on chronic diastolic (congestive) heart failure: Secondary | ICD-10-CM | POA: Diagnosis present

## 2019-02-21 DIAGNOSIS — I4891 Unspecified atrial fibrillation: Secondary | ICD-10-CM | POA: Insufficient documentation

## 2019-02-21 DIAGNOSIS — I509 Heart failure, unspecified: Secondary | ICD-10-CM

## 2019-02-21 DIAGNOSIS — Z6841 Body Mass Index (BMI) 40.0 and over, adult: Secondary | ICD-10-CM | POA: Diagnosis not present

## 2019-02-21 DIAGNOSIS — I484 Atypical atrial flutter: Secondary | ICD-10-CM | POA: Diagnosis not present

## 2019-02-21 DIAGNOSIS — Z8249 Family history of ischemic heart disease and other diseases of the circulatory system: Secondary | ICD-10-CM | POA: Insufficient documentation

## 2019-02-21 DIAGNOSIS — Z7901 Long term (current) use of anticoagulants: Secondary | ICD-10-CM | POA: Diagnosis not present

## 2019-02-21 DIAGNOSIS — Z86718 Personal history of other venous thrombosis and embolism: Secondary | ICD-10-CM | POA: Insufficient documentation

## 2019-02-21 DIAGNOSIS — I5032 Chronic diastolic (congestive) heart failure: Secondary | ICD-10-CM

## 2019-02-21 MED ORDER — TORSEMIDE 20 MG PO TABS: 40.0000 mg | ORAL_TABLET | Freq: Every day | ORAL | 5 refills | Status: DC

## 2019-02-21 NOTE — Patient Instructions (Addendum)
INCREASE Torsemide to 40mg  (2 tabs) twice a day  Your physician recommends that you schedule a follow-up appointment in: 4 weeks with Nurse Practitioner on Thursday, December 17th, 2020 at 3pm  At the [facility], you and your health needs are our priority. As part of our continuing mission to provide you with exceptional heart care, we have created designated Provider Care Teams. These Care Teams include your primary Cardiologist (physician) and Advanced Practice Providers (APPs- Physician Assistants and Nurse Practitioners) who all work together to provide you with the care you need, when you need it.   You may see any of the following providers on your designated Care Team at your next follow up: Marland Kitchen Dr Glori Bickers . Dr Loralie Champagne . Darrick Grinder, NP . Lyda Jester, PA   Please be sure to bring in all your medications bottles to every appointment.

## 2019-02-21 NOTE — Patient Instructions (Signed)
Medication Instructions:   Your physician recommends that you continue on your current medications as directed. Please refer to the Current Medication list given to you today.  *If you need a refill on your cardiac medications before your next appointment, please call your pharmacy*  Lab Work: NONE ORDERED  TODAY   If you have labs (blood work) drawn today and your tests are completely normal, you will receive your results only by: . MyChart Message (if you have MyChart) OR . A paper copy in the mail If you have any lab test that is abnormal or we need to change your treatment, we will call you to review the results.  Testing/Procedures: NONE ORDERED  TODAY  Follow-Up: At [facility], you and your health needs are our priority.  As part of our continuing mission to provide you with exceptional heart care, we have created designated Provider Care Teams.  These Care Teams include your primary Cardiologist (physician) and Advanced Practice Providers (APPs -  Physician Assistants and Nurse Practitioners) who all work together to provide you with the care you need, when you need it.  Your next appointment:   12 months  The format for your next appointment:   In Person  Provider:   You may see Dr. Allred  or one of the following Advanced Practice Providers on your designated Care Team:    Amber Seiler, NP  Renee Ursuy, PA-C  Michael "Andy" Tillery, PA-C  Other Instructions   

## 2019-02-21 NOTE — Progress Notes (Signed)
Advanced Heart Failure Clinic Note    PCP: [name], MD PCP-HF Cardiologist: Dr Haroldine Laws  HPI: [name] is a 63 y/o female w/ h/o obesity, HTN, DM, atrial fib, DVT, PE x3 , s/p IVC filter placed in 2013, CHB s/p MDT single chamber PPM 08/2018, and chronic diastolic heart failure. In 2013, she had sleep study with mild sleep apnea noted.   R/LHC in 2016 showed no significant coronary diseased, cardiac output 8.9 and PA pressured 41/13.    In January 2020 she had presyncopal episode and had some intermittent dizziness.  She told her PCP and was instructed to keep a log. In May she had presyncope and a syncopal episode. She had a fall off her porch. This was determined to be in the setting of CHB. MDT single chamber PPM placed. She also had minimally displaced C6 fracture.  She has functional limitation at baseline due to chronic knee pain. She uses a walker for ambulation.    She recently presented to [facility] on 12/26/18  w/ dyspnea and cough. Initial w/u in the ED c/w acute on chronic diastolic HF. Admitted and started on IV diuretics. 2D echo showed EF 65-70% w/ pseudonormalization of LV consistent with diastolic dysfunction. She was diuresed w/ Lasix. RHC completed with preserved cardiac output and low filling pressures. PYP scan not convincing for amyloid. Outpatient sleep study recommended. EP saw 9/22 reprogrammed to VVI 50 due to asynchronous V pacing.Prior to d/c was transitioned to PO torsemide 20 mg daily. Also sent home on Hydralazine 25 mg three times a day, Imdur 60 mg daily, Crestor 10 mg daily. Her discharge wt was 117 kg (257 lb).  Today she returns for HF follow up. Last visit torsemide was increased to 40 mg twice a day for 2 day then back to torsemide 40 mg daily. Initally her weight went down to 265 but then went back up to 275 pounds. Overall feeling fair. Complaining of fatigue. Husband says she snores.  SOB with exertion. Denies PND/Orthopnea. Limited mobility due  to knee pain. Appetite ok. Eating lots of ice. No fever or chills.Taking all medications. Needs sleep study but she cant pay the 75.00 to obtain.    Echo 12/27/18  1. Left ventricular ejection fraction, by visual estimation, is 65 to 70%. The left ventricle has normal function. Normal left ventricular size. There is moderately increased left ventricular hypertrophy. 2. Left ventricular diastolic Doppler parameters are consistent with pseudonormalization pattern of LV diastolic filling. 3. Global right ventricle has normal systolic function.The right ventricular size is normal. No increase in right ventricular wall thickness. 4. Left atrial size was mildly dilated. 5. Right atrial size was normal. 6. Mild to moderate aortic valve annular calcification. 7. Moderate to severe mitral annular calcification. 8. The mitral valve is normal in structure. No evidence of mitral valve regurgitation. No evidence of mitral stenosis. 9. The tricuspid valve is normal in structure. Tricuspid valve regurgitation was not visualized by color flow Doppler. 10. The aortic valve is normal in structure. Aortic valve regurgitation was not visualized by color flow Doppler. Mild to moderate aortic valve sclerosis/calcification without any evidence of aortic stenosis. 11. The pulmonic valve was normal in structure. Pulmonic valve regurgitation is not visualized by color flow Doppler. 12. Normal pulmonary artery systolic pressure. 13. A pacer wire is visualized. 14. The inferior vena cava is normal in size with greater than 50% respiratory variability, suggesting right atrial pressure of 3 mmHg.   Past Medical History:  Diagnosis Date  .  A-fib ([place])   . Anemia   . Anxiety   . Arthritis    "right hip; both knees; left wrist/shoulder; back" (01/19/2013"  . Bleeding on Coumadin 08/2012; 01/18/2013   [facility] admissions (01/19/2013)  . CHF (congestive heart failure) ([place])    "2-3 times" (01/19/2013)  . Chronic  lower back pain   . Depression   . DVT (deep venous thrombosis) ([place]) 10 years ago   numerous/notes 01/18/2013  . GERD (gastroesophageal reflux disease)   . Gout   . [id number])    "maybe weekly" (01/19/2013)  . Heart murmur   . High cholesterol    "been off RX for this at one time" (01/18/2013)  . History of blood transfusion 1983; 04/2012   "3 w/ childbirth; hospitalized for pain" (01/19/2013)  . Hypertension   . Hypothyroidism   . Migraines    "twice/yr maybe" (01/19/2013)  . OSA (obstructive sleep apnea)    "sent me for test in 04/2012; never ordered mask, etc" (01/19/2013)  . PE (pulmonary thromboembolism) ([place]) 3 years ag0   3/notes 01/18/2013  . Pneumonia before 2011   "once' (01/18/2013)  . Renal disorder    kindey function low; "Metformin was destroying my kidneys" (01/19/2013)  . Shortness of breath    "only related to my CHF" (01/18/2013)  . Swelling of hand 08/31/2014   RT HAND  . Type II diabetes mellitus ([place])   . UTI (urinary tract infection) 08/31/2014    Current Outpatient Medications  Medication Sig Dispense Refill  . acetaminophen (TYLENOL) 500 MG tablet Take 500 mg by mouth every 6 (six) hours as needed for fever or headache (pain).    Marland Kitchen allopurinol (ZYLOPRIM) 300 MG tablet Take 300 mg by mouth daily.    . Biotin 1000 MCG tablet Take 500 mcg by mouth daily with lunch.     . Calcium Citrate-Vitamin D (CITRACAL/VITAMIN D PO) Take 2 tablets by mouth every evening.    . citalopram (CELEXA) 20 MG tablet Take 20 mg by mouth at bedtime.    . ferrous sulfate 325 (65 FE) MG tablet Take 325 mg by mouth daily.    . insulin aspart (NOVOLOG) 100 UNIT/ML injection Inject 2 Units into the skin 3 (three) times daily with meals. (Patient taking differently: Inject 8 Units into the skin 3 (three) times daily as needed for high blood sugar (CBG >160). ) 10 mL 11  . Insulin Detemir (LEVEMIR FLEXTOUCH) 100 UNIT/ML Pen Inject 20 Units into the skin daily before breakfast.      . isosorbide mononitrate (IMDUR) 60 MG 24 hr tablet Take 1 tablet (60 mg total) by mouth daily. 90 tablet 0  . levothyroxine (SYNTHROID) 75 MCG tablet Take 75 mcg by mouth daily before breakfast.     . Melatonin 5 MG TABS Take 10 mg by mouth at bedtime.     . Multiple Vitamin (MULITIVITAMIN WITH MINERALS) TABS Take 1 tablet by mouth daily.    . Omega-3 Fatty Acids (FISH OIL) 1000 MG CAPS Take 1,000 mg by mouth daily.     . potassium chloride SA (KLOR-CON) 20 MEQ tablet Take 2 tablets (40 mEq total) by mouth daily. 180 tablet 3  . rosuvastatin (CRESTOR) 10 MG tablet Take 1 tablet (10 mg total) by mouth daily. (Patient taking differently: Take 10 mg by mouth at bedtime. ) 30 tablet 0  . Semaglutide,0.25 or 0.5MG /DOS, (OZEMPIC, 0.25 OR 0.5 MG/DOSE,) 2 MG/1.5ML SOPN Inject 0.5 mg into the skin once a week.    Marland Kitchen  torsemide (DEMADEX) 20 MG tablet Take 2 tablets (40 mg total) by mouth daily. 60 tablet 3  . warfarin (COUMADIN) 2.5 MG tablet TAKE 7.5 MG ON M-W-F AND 5 MG TUES, THURS, SAT AND SUN AS DIRECTED (Patient taking differently: Take 5-7.5 mg by mouth See admin instructions. Take 2 tablets (5 mg) by mouth on Monday,Wednesday, Friday, Saturday and Sunday. Take 3 tablets (7.5 mg) on Tuesday and Thursday.) 120 tablet 1   No current facility-administered medications for this encounter.     Allergies  Allergen Reactions  . Morphine And Related Rash      Social History   Socioeconomic History  . Marital status: Single    Spouse name: Not on file  . Number of children: Not on file  . Years of education: Not on file  . Highest education level: Not on file  Occupational History  . Occupation: disabled  Social Needs  . Financial resource strain: Not on file  . Food insecurity    Worry: Not on file    Inability: Not on file  . Transportation needs    Medical: Not on file    Non-medical: Not on file  Tobacco Use  . Smoking status: Former Smoker    Packs/day: 0.05    Years: 30.00    Pack  years: 1.50    Types: Cigarettes    Quit date: 1990    Years since quitting: 30.8  . Smokeless tobacco: Never Used  . Tobacco comment: 01/19/2013 "quit smoking cigarettes in the early '90's"  Substance and Sexual Activity  . Alcohol use: Not Currently    Comment: rare now but never a heavy drinker  . Drug use: No  . Sexual activity: Never  Lifestyle  . Physical activity    Days per week: Not on file    Minutes per session: Not on file  . Stress: Not on file  Relationships  . Social Herbalist on phone: Not on file    Gets together: Not on file    Attends religious service: Not on file    Active member of club or organization: Not on file    Attends meetings of clubs or organizations: Not on file    Relationship status: Not on file  . Intimate partner violence    Fear of current or ex partner: Not on file    Emotionally abused: Not on file    Physically abused: Not on file    Forced sexual activity: Not on file  Other Topics Concern  . Not on file  Social History Narrative  . Not on file      Family History  Problem Relation Age of Onset  . Cerebral aneurysm Mother   . Hypertension Father   . Cerebral aneurysm Maternal Grandfather   . Cerebral aneurysm Maternal Aunt   . Cancer Maternal Uncle     Vitals:   02/21/19 1425  BP: 136/76  Pulse: 76  SpO2: 98%  Weight: 124.6 kg (274 lb 12.8 oz)   Wt Readings from Last 3 Encounters:  02/21/19 124.6 kg (274 lb 12.8 oz)  02/21/19 125.2 kg (276 lb)  02/08/19 125.1 kg (275 lb 12.8 oz)    PHYSICAL EXAM: General:  Ambulated in the clinic with a cane. No resp difficulty HEENT: normal Neck: supple. JVP 11-12 . Carotids 2+ bilat; no bruits. No lymphadenopathy or thryomegaly appreciated. Cor: PMI nondisplaced. Regular rate & rhythm. No rubs, gallops or murmurs. Lungs: clear Abdomen: obese, soft, nontender, nondistended.  No hepatosplenomegaly. No bruits or masses. Good bowel sounds. Extremities: no cyanosis,  clubbing, rash, R and LLE 2+ edema. Chronic hyperpigmentation.  Neuro: alert & orientedx3, cranial nerves grossly intact. moves all 4 extremities w/o difficulty. Affect pleasant  ASSESSMENT & PLAN: 1. Chronic Diastolic Heart Failure: ECHO 12/2017 EF 65-70% , moderately increased LVH. [place] 12/2018 with preserved cardiac output and low filling pressures. PYP scan not convincing for amyloid.  - NYHA III.  Volume status elevated. Increase torsemide to 40 mg twice a day +  K to 40 meq daily. Discussed limiting fluid intake to < 2 liters. Discussed low salt food choices.  -Continue Imdur  for BP control -Will arrange sleep study to r/o OSA . Will need financial assistance to help pay for this crucial test.   2.CKD3b -Baseline creatinine is ~1.6-1.8   3. HTN:  Stable.   4.H/O CHB with MDT PPM-  - Followd EP reprogrammed  to VVI 50   5. DM:  - On insulin.  - Followed by PCP   6. Hypothyroidism  -On levothyroxine. Followed by PCP  7. H/O PEs: - On coumadin.  8. Obesity Body mass index is 50.26 kg/m. Discussed portion control.   9. Snores Set up home sleep study. Highly suspicious for sleep apnea.   Follow up in 4-6 weeks.        Darrick Grinder, NP 02/21/19

## 2019-02-22 ENCOUNTER — Telehealth (HOSPITAL_COMMUNITY): Payer: Self-pay | Admitting: Licensed Clinical Social Worker

## 2019-02-22 NOTE — Telephone Encounter (Signed)
CSW consulted to help pt with sleep study cost- pt reported that she had to pay $70 up front to [facility] to get home sleep study and that this was unaffordable at this time.  CSW reached out to [employer] rep who reports that this payment would not need to be up front and that a payment plan could be made to spread this cost out over several months- CSW explained this to patient who reports this should be manageable- CSW encouraged pt to reach out if she encounters problems after receiving the bill.    CSW informed [facility] rep that patient is now agreeable to moving forward  Jorge Ny, [facility] Desk#: [phone] Cell#: [phone]

## 2019-02-25 ENCOUNTER — Encounter (INDEPENDENT_AMBULATORY_CARE_PROVIDER_SITE_OTHER): Payer: Medicare HMO | Admitting: Cardiology

## 2019-02-25 DIAGNOSIS — G4733 Obstructive sleep apnea (adult) (pediatric): Secondary | ICD-10-CM | POA: Diagnosis not present

## 2019-03-02 ENCOUNTER — Ambulatory Visit (INDEPENDENT_AMBULATORY_CARE_PROVIDER_SITE_OTHER): Payer: Medicare HMO | Admitting: *Deleted

## 2019-03-02 DIAGNOSIS — R001 Bradycardia, unspecified: Secondary | ICD-10-CM

## 2019-03-02 LAB — CUP PACEART REMOTE DEVICE CHECK
Battery Remaining Longevity: 162 mo
Battery Voltage: 3.19 V
Brady Statistic RV Percent Paced: 1.77 %
Date Time Interrogation Session: 20201125002129
Implantable Lead Implant Date: 20200526
Implantable Lead Location: 753860
Implantable Lead Model: 5076
Implantable Pulse Generator Implant Date: 20200526
Lead Channel Impedance Value: 380 Ohm
Lead Channel Impedance Value: 532 Ohm
Lead Channel Pacing Threshold Amplitude: 0.5 V
Lead Channel Pacing Threshold Pulse Width: 0.4 ms
Lead Channel Sensing Intrinsic Amplitude: 16.625 mV
Lead Channel Sensing Intrinsic Amplitude: 16.625 mV
Lead Channel Setting Pacing Amplitude: 2.5 V
Lead Channel Setting Pacing Pulse Width: 0.4 ms
Lead Channel Setting Sensing Sensitivity: 2.8 mV

## 2019-03-10 ENCOUNTER — Ambulatory Visit: Payer: Medicare HMO | Admitting: Cardiovascular Disease

## 2019-03-16 ENCOUNTER — Ambulatory Visit: Payer: Medicare HMO

## 2019-03-16 NOTE — Procedures (Signed)
   Sleep Study Report  Patient Information Name: [name]  ID: [id number] Birth Date: 09-27-1955  Age: 63 Gender:Female Study Date:02/25/2019  Summary & Diagnosis  TEST DESCRIPTION: Home sleep apnea testing was completed using the WatchPat, a Type 1 device, utilizing peripheral arterial tonometry (PAT), chest movement, actigraphy, pulse oximetry, pulse rate, body position and snore.AHI was calculated with apnea and hypopnea using valid sleep time as the denominator. RDI includes apneas, hypopneas, and RERAs. The data acquired and the scoring of sleep and all associated events were performed in accordance with the recommended standards and specifications as outlined in the AASM Manual for the Scoring of Sleep and Associated Events 2.2.0 (2015).  FINDINGS: 1. Moderate Obstructive sleep apnea with an AHI of 18/hr. 2. No significant Central Sleep Apnea. pAHIc 0.8/hr. 3. Most events occurred in the supine position. 4. The minimum O2 sat was 81%. 5. Time spent with O2 sats < 88% was 5.6 minutes. 6. Mild snoring was noted. 7. Total sleep time was 7 hours and 35 minutes. 8. Normal sleep onset latency at 27 minutes. 9. Shortened REM sleep onset at 37 minutes. 10. Fragmented sleep with 14% REM sleep and 9 awakenings.  DIAGNOSIS: Moderate Obstructive Sleep Apnea (G47.33) Nocturnal Hypoxemia  RECOMMENDATIONS 1. Findings are consistent with moderate OSA. For symptomatic moderate obstructive sleep apnea, patient preference and compliance impacts efficacious outcomes. Therapeutic options include:   a. The patient may benefit from the use of a nocturnal mandibular repositioning appliance. If that line of therapy is to be pursued the patient should be evaluated by a dentist trained in the treatment of sleep related breathing disorders.   b. An ENT consultation which may be useful for specific causes of obstruction and possible treatment options .   c. Consider treatment with nasal  continuous positive airway pressure ( CPAP). If the patient chooses CPAP therapy, a nocturnal PSG with CPAP titration is recommended. As an alternative, an Auto PAP with pressure range 5-20cm H2O with download is an option.  2. Weight loss may be of benefit in reducing the severity of respiratory events and snoring .  3. Routine follow-up efficacy testing should be performed.  Report prepared by: Signature: Fransico Him Electronically  Signed: Mar 16, 2019

## 2019-03-18 ENCOUNTER — Telehealth: Payer: Self-pay | Admitting: *Deleted

## 2019-03-18 DIAGNOSIS — G4733 Obstructive sleep apnea (adult) (pediatric): Secondary | ICD-10-CM

## 2019-03-18 DIAGNOSIS — Z1231 Encounter for screening mammogram for malignant neoplasm of breast: Secondary | ICD-10-CM

## 2019-03-18 NOTE — Telephone Encounter (Signed)
-----   Message from Sueanne Margarita, MD sent at 03/16/2019  4:08 PM EST ----- Please let patient know that they have sleep apnea and recommend CPAP titration. Please set up titration in the sleep lab.

## 2019-03-18 NOTE — Telephone Encounter (Signed)
Informed patient of sleep study results and patient understanding was verbalized. Patient understands her sleep study showed they have sleep apnea and recommend CPAP titration. Please set up titration in the sleep lab.   Pt is aware and agreeable to her results. cpap titration sent to precert.

## 2019-03-22 ENCOUNTER — Telehealth: Payer: Self-pay | Admitting: *Deleted

## 2019-03-22 NOTE — Telephone Encounter (Signed)
CPAP titration PA submitted to [facility] via web portal.

## 2019-03-24 ENCOUNTER — Encounter (HOSPITAL_COMMUNITY): Payer: Medicare HMO

## 2019-03-25 ENCOUNTER — Ambulatory Visit: Payer: Medicare HMO | Admitting: Cardiovascular Disease

## 2019-03-28 ENCOUNTER — Other Ambulatory Visit: Payer: Self-pay

## 2019-03-29 NOTE — Progress Notes (Signed)
PPM remote 

## 2019-03-30 ENCOUNTER — Telehealth: Payer: Self-pay | Admitting: *Deleted

## 2019-03-30 NOTE — Telephone Encounter (Signed)
Staff message sent to Gae Bon ok to schedule CPAP titration. [employer] received. Auth # [id number]. Valid dates 03/26/19 to 09/22/19.

## 2019-04-07 ENCOUNTER — Telehealth: Payer: Self-pay | Admitting: *Deleted

## 2019-04-07 ENCOUNTER — Telehealth (HOSPITAL_COMMUNITY): Payer: Self-pay

## 2019-04-07 NOTE — Telephone Encounter (Signed)
LMOM for Patient to Please return call to office to discuss [place] Sleep Study. [facility] contacted the office because they are unable to get in contact with the Patient.

## 2019-04-07 NOTE — Telephone Encounter (Signed)
Patient is scheduled for CPAP Titration on 04/26/19. PT is scheduled for COVID screening on 04/23/19 12:30 prior to titration.   Patient understands her titration study will be done at [facility] sleep lab. Patient understands she will receive a letter in a week or so detailing appointment, date, time, and location. Patient understands to call if she does not receive the letter  in a timely manner. Patient agrees with treatment and thanked me for call.

## 2019-04-07 NOTE — Telephone Encounter (Signed)
-----   Message from Lauralee Evener, [place] sent at 03/30/2019 10:52 AM EST ----- Regarding: RE: precert Received Holli Humbles. Ok to schedule. Auth # [id number] Valid dates 03/26/19 to 09/22/19. ----- Message ----- From: Freada Bergeron, CMA Sent: 03/21/2019  11:58 AM EST To: [facility] Sleep Studies Subject: precert                                        cpap titration

## 2019-04-11 ENCOUNTER — Telehealth (HOSPITAL_COMMUNITY): Payer: Self-pay

## 2019-04-11 NOTE — Telephone Encounter (Signed)
Patient decided to do her CPAP Titration overnight at [facility] on 04/26/2019.

## 2019-04-12 ENCOUNTER — Ambulatory Visit (HOSPITAL_COMMUNITY)
Admission: RE | Admit: 2019-04-12 | Discharge: 2019-04-12 | Disposition: A | Discharge status: Home / Self Care | Payer: Medicare HMO | Source: Ambulatory Visit | Attending: Cardiology | Admitting: Cardiology

## 2019-04-12 ENCOUNTER — Encounter (HOSPITAL_COMMUNITY): Payer: Self-pay

## 2019-04-12 ENCOUNTER — Other Ambulatory Visit: Payer: Self-pay

## 2019-04-12 ENCOUNTER — Telehealth (HOSPITAL_COMMUNITY): Payer: Self-pay

## 2019-04-12 VITALS — BP 128/85 | HR 63 | Wt 278.2 lb

## 2019-04-12 DIAGNOSIS — K219 Gastro-esophageal reflux disease without esophagitis: Secondary | ICD-10-CM | POA: Insufficient documentation

## 2019-04-12 DIAGNOSIS — G4733 Obstructive sleep apnea (adult) (pediatric): Secondary | ICD-10-CM | POA: Diagnosis not present

## 2019-04-12 DIAGNOSIS — F419 Anxiety disorder, unspecified: Secondary | ICD-10-CM | POA: Insufficient documentation

## 2019-04-12 DIAGNOSIS — I272 Pulmonary hypertension, unspecified: Secondary | ICD-10-CM

## 2019-04-12 DIAGNOSIS — Z885 Allergy status to narcotic agent status: Secondary | ICD-10-CM | POA: Diagnosis not present

## 2019-04-12 DIAGNOSIS — I5032 Chronic diastolic (congestive) heart failure: Secondary | ICD-10-CM | POA: Diagnosis present

## 2019-04-12 DIAGNOSIS — Z79890 Hormone replacement therapy (postmenopausal): Secondary | ICD-10-CM | POA: Insufficient documentation

## 2019-04-12 DIAGNOSIS — D649 Anemia, unspecified: Secondary | ICD-10-CM | POA: Insufficient documentation

## 2019-04-12 DIAGNOSIS — Z6841 Body Mass Index (BMI) 40.0 and over, adult: Secondary | ICD-10-CM | POA: Diagnosis not present

## 2019-04-12 DIAGNOSIS — Z950 Presence of cardiac pacemaker: Secondary | ICD-10-CM | POA: Insufficient documentation

## 2019-04-12 DIAGNOSIS — Z87891 Personal history of nicotine dependence: Secondary | ICD-10-CM | POA: Diagnosis not present

## 2019-04-12 DIAGNOSIS — I130 Hypertensive heart and chronic kidney disease with heart failure and stage 1 through stage 4 chronic kidney disease, or unspecified chronic kidney disease: Secondary | ICD-10-CM | POA: Diagnosis not present

## 2019-04-12 DIAGNOSIS — Z794 Long term (current) use of insulin: Secondary | ICD-10-CM | POA: Insufficient documentation

## 2019-04-12 DIAGNOSIS — Z86718 Personal history of other venous thrombosis and embolism: Secondary | ICD-10-CM | POA: Diagnosis not present

## 2019-04-12 DIAGNOSIS — M109 Gout, unspecified: Secondary | ICD-10-CM | POA: Diagnosis not present

## 2019-04-12 DIAGNOSIS — E1122 Type 2 diabetes mellitus with diabetic chronic kidney disease: Secondary | ICD-10-CM | POA: Insufficient documentation

## 2019-04-12 DIAGNOSIS — Z8249 Family history of ischemic heart disease and other diseases of the circulatory system: Secondary | ICD-10-CM | POA: Insufficient documentation

## 2019-04-12 DIAGNOSIS — E039 Hypothyroidism, unspecified: Secondary | ICD-10-CM | POA: Insufficient documentation

## 2019-04-12 DIAGNOSIS — F329 Major depressive disorder, single episode, unspecified: Secondary | ICD-10-CM | POA: Diagnosis not present

## 2019-04-12 DIAGNOSIS — Z7901 Long term (current) use of anticoagulants: Secondary | ICD-10-CM | POA: Diagnosis not present

## 2019-04-12 DIAGNOSIS — N1832 Chronic kidney disease, stage 3b: Secondary | ICD-10-CM | POA: Insufficient documentation

## 2019-04-12 DIAGNOSIS — Z79899 Other long term (current) drug therapy: Secondary | ICD-10-CM | POA: Diagnosis not present

## 2019-04-12 DIAGNOSIS — Z87440 Personal history of urinary (tract) infections: Secondary | ICD-10-CM | POA: Insufficient documentation

## 2019-04-12 DIAGNOSIS — I4891 Unspecified atrial fibrillation: Secondary | ICD-10-CM | POA: Insufficient documentation

## 2019-04-12 DIAGNOSIS — Z86711 Personal history of pulmonary embolism: Secondary | ICD-10-CM | POA: Diagnosis not present

## 2019-04-12 DIAGNOSIS — I442 Atrioventricular block, complete: Secondary | ICD-10-CM | POA: Diagnosis not present

## 2019-04-12 DIAGNOSIS — M1990 Unspecified osteoarthritis, unspecified site: Secondary | ICD-10-CM | POA: Insufficient documentation

## 2019-04-12 MED ORDER — TORSEMIDE 20 MG PO TABS: ORAL_TABLET | ORAL | 3 refills | Status: DC

## 2019-04-12 MED ORDER — POTASSIUM CHLORIDE CRYS ER 20 MEQ PO TBCR: 60.0000 meq | EXTENDED_RELEASE_TABLET | Freq: Every day | ORAL | 3 refills | Status: DC

## 2019-04-12 NOTE — Telephone Encounter (Signed)
COVID-19 pre-appointment screening questions: °  °Do you have a history of COVID-19 or a positive test result in the past 7-10 days? NO ° °To the best of your knowledge, have you been in close contact with anyone with a confirmed diagnosis of COVID 19? NO ° °Have you had any one or more of the following: Fever, chills, cough, shortness of breath (out of the normal for you) or any flu-like symptoms? NO ° °Are you experiencing any of the following symptoms that is new or out of usual for you: NO ° °• Ear, nose or throat discomfort °• Sore throat °• Headache °• Muscle Pain °• Diarrhea °• Loss of taste or smell ° ° °Reviewed all the following with patient: °• Use of hand sanitizer when entering the building °• Everyone is required to wear a mask in the building, if you do not have a mask we are happy to provide you with one when you arrive °• NO Visitor guidelines ° ° °If patient answers YES to any of questions they must change to a virtual visit and place note in comments about symptoms ° °

## 2019-04-12 NOTE — Progress Notes (Signed)
Advanced Heart Failure Clinic Note    PCP: [name], MD PCP-HF Cardiologist: Dr Haroldine Laws   HPI: [name] is a 64 y/o female w/ h/o obesity, HTN, DM, atrial fib, DVT, PE x3 , s/p IVC filter placed in 2013, CHB s/p MDT single chamber PPM 08/2018, and chronic diastolic heart failure. In 2013, she had sleep study with mild sleep apnea noted.   R/LHC in 2016 showed no significant coronary diseased, cardiac output 8.9 and PA pressured 41/13.    In January 2020 she had presyncopal episode and had some intermittent dizziness.  She told her PCP and was instructed to keep a log. In May she had presyncope and a syncopal episode. She had a fall off her porch. This was determined to be in the setting of CHB. MDT single chamber PPM placed. She also had minimally displaced C6 fracture.  She has functional limitation at baseline due to chronic knee pain. She uses a walker for ambulation.    She recently presented to [facility] on 12/26/18  w/ dyspnea and cough. Initial w/u in the ED c/w acute on chronic diastolic HF. Admitted and started on IV diuretics. 2D echo showed EF 65-70% w/ pseudonormalization of LV consistent with diastolic dysfunction. She was diuresed w/ Lasix. RHC completed with preserved cardiac output and low filling pressures. PYP scan not convincing for amyloid. Outpatient sleep study recommended. EP saw 9/22 reprogrammed to VVI 50 due to asynchronous V pacing.Prior to d/c was transitioned to PO torsemide 20 mg daily. Also sent home on Hydralazine 25 mg three times a day, Imdur 60 mg daily, Crestor 10 mg daily. Her discharge wt was 117 kg (257 lb).  She had repeat sleep study 02/25/19 and found to have moderate OSA. No device yet. This is being followed by Dr. Radford Pax. She has f/u visit on 04/26/19.   At last [facility] visit, she was volume overloaded and diuretics were increased. Torsemide increased to 40 mg bid and Kdur increased to 40 mEq daily. She presents back for f/u. Wt is actually up  8 lb since last visit. Compared to last hospitalization 9/20, she is up ~20 lb, from 258>>278 lb. She is morbidly obese and has chronic exertional dyspnea but not any worse than her usual baseline. No resting dyspnea. Reports full med compliance but admits to dietary indiscretion w/ sodium over the holidays + increase in fluid intake. BP stable.    Echo 12/27/18  1. Left ventricular ejection fraction, by visual estimation, is 65 to 70%. The left ventricle has normal function. Normal left ventricular size. There is moderately increased left ventricular hypertrophy. 2. Left ventricular diastolic Doppler parameters are consistent with pseudonormalization pattern of LV diastolic filling. 3. Global right ventricle has normal systolic function.The right ventricular size is normal. No increase in right ventricular wall thickness. 4. Left atrial size was mildly dilated. 5. Right atrial size was normal. 6. Mild to moderate aortic valve annular calcification. 7. Moderate to severe mitral annular calcification. 8. The mitral valve is normal in structure. No evidence of mitral valve regurgitation. No evidence of mitral stenosis. 9. The tricuspid valve is normal in structure. Tricuspid valve regurgitation was not visualized by color flow Doppler. 10. The aortic valve is normal in structure. Aortic valve regurgitation was not visualized by color flow Doppler. Mild to moderate aortic valve sclerosis/calcification without any evidence of aortic stenosis. 11. The pulmonic valve was normal in structure. Pulmonic valve regurgitation is not visualized by color flow Doppler. 12. Normal pulmonary artery systolic pressure.  13. A pacer wire is visualized. 14. The inferior vena cava is normal in size with greater than 50% respiratory variability, suggesting right atrial pressure of 3 mmHg.   Past Medical History:  Diagnosis Date  . A-fib ([place])   . Anemia   . Anxiety   . Arthritis    "right hip; both knees;  left wrist/shoulder; back" (01/19/2013"  . Bleeding on Coumadin 08/2012; 01/18/2013   [facility] admissions (01/19/2013)  . CHF (congestive heart failure) ([place])    "2-3 times" (01/19/2013)  . Chronic lower back pain   . Depression   . DVT (deep venous thrombosis) ([place]) 10 years ago   numerous/notes 01/18/2013  . GERD (gastroesophageal reflux disease)   . Gout   . [id number])    "maybe weekly" (01/19/2013)  . Heart murmur   . High cholesterol    "been off RX for this at one time" (01/18/2013)  . History of blood transfusion 1983; 04/2012   "3 w/ childbirth; hospitalized for pain" (01/19/2013)  . Hypertension   . Hypothyroidism   . Migraines    "twice/yr maybe" (01/19/2013)  . OSA (obstructive sleep apnea)    "sent me for test in 04/2012; never ordered mask, etc" (01/19/2013)  . PE (pulmonary thromboembolism) ([place]) 3 years ag0   3/notes 01/18/2013  . Pneumonia before 2011   "once' (01/18/2013)  . Renal disorder    kindey function low; "Metformin was destroying my kidneys" (01/19/2013)  . Shortness of breath    "only related to my CHF" (01/18/2013)  . Swelling of hand 08/31/2014   RT HAND  . Type II diabetes mellitus ([place])   . UTI (urinary tract infection) 08/31/2014    Current Outpatient Medications  Medication Sig Dispense Refill  . acetaminophen (TYLENOL) 500 MG tablet Take 500 mg by mouth every 6 (six) hours as needed for fever or headache (pain).    Marland Kitchen allopurinol (ZYLOPRIM) 300 MG tablet Take 300 mg by mouth daily.    . Biotin 1000 MCG tablet Take 500 mcg by mouth daily with lunch.     . Calcium Citrate-Vitamin D (CITRACAL/VITAMIN D PO) Take 2 tablets by mouth every evening.    . citalopram (CELEXA) 20 MG tablet Take 20 mg by mouth at bedtime.    . ferrous sulfate 325 (65 FE) MG tablet Take 325 mg by mouth daily.    . insulin aspart (NOVOLOG) 100 UNIT/ML injection Inject 2 Units into the skin 3 (three) times daily with meals. 10 mL 11  . Insulin Detemir (LEVEMIR FLEXTOUCH)  100 UNIT/ML Pen Inject 20 Units into the skin daily before breakfast.     . isosorbide mononitrate (IMDUR) 60 MG 24 hr tablet Take 1 tablet (60 mg total) by mouth daily. 90 tablet 0  . levothyroxine (SYNTHROID) 75 MCG tablet Take 75 mcg by mouth daily before breakfast.     . Melatonin 5 MG TABS Take 10 mg by mouth at bedtime.     . Multiple Vitamin (MULITIVITAMIN WITH MINERALS) TABS Take 1 tablet by mouth daily.    . Omega-3 Fatty Acids (FISH OIL) 1000 MG CAPS Take 1,000 mg by mouth daily.     . potassium chloride SA (KLOR-CON) 20 MEQ tablet Take 2 tablets (40 mEq total) by mouth daily. 180 tablet 3  . rosuvastatin (CRESTOR) 10 MG tablet Take 1 tablet (10 mg total) by mouth daily. 30 tablet 0  . Semaglutide,0.25 or 0.5MG /DOS, (OZEMPIC, 0.25 OR 0.5 MG/DOSE,) 2 MG/1.5ML SOPN Inject 0.5 mg into the skin once  a week.    . torsemide (DEMADEX) 20 MG tablet Take 2 tablets (40 mg total) by mouth daily. 120 tablet 5  . warfarin (COUMADIN) 2.5 MG tablet TAKE 7.5 MG ON M-W-F AND 5 MG TUES, THURS, SAT AND SUN AS DIRECTED 120 tablet 1   No current facility-administered medications for this encounter.    Allergies  Allergen Reactions  . Morphine And Related Rash      Social History   Socioeconomic History  . Marital status: Single    Spouse name: Not on file  . Number of children: Not on file  . Years of education: Not on file  . Highest education level: Not on file  Occupational History  . Occupation: disabled  Tobacco Use  . Smoking status: Former Smoker    Packs/day: 0.05    Years: 30.00    Pack years: 1.50    Types: Cigarettes    Quit date: 1990    Years since quitting: 31.0  . Smokeless tobacco: Never Used  . Tobacco comment: 01/19/2013 "quit smoking cigarettes in the early '90's"  Substance and Sexual Activity  . Alcohol use: Not Currently    Comment: rare now but never a heavy drinker  . Drug use: No  . Sexual activity: Never  Other Topics Concern  . Not on file  Social  History Narrative  . Not on file   Social Determinants of Health   Financial Resource Strain:   . Difficulty of Paying Living Expenses: Not on file  Food Insecurity:   . Worried About Charity fundraiser in the Last Year: Not on file  . Ran Out of Food in the Last Year: Not on file  Transportation Needs:   . Lack of Transportation (Medical): Not on file  . Lack of Transportation (Non-Medical): Not on file  Physical Activity:   . Days of Exercise per Week: Not on file  . Minutes of Exercise per Session: Not on file  Stress:   . Feeling of Stress : Not on file  Social Connections:   . Frequency of Communication with Friends and Family: Not on file  . Frequency of Social Gatherings with Friends and Family: Not on file  . Attends Religious Services: Not on file  . Active Member of Clubs or Organizations: Not on file  . Attends Archivist Meetings: Not on file  . Marital Status: Not on file  Intimate Partner Violence:   . Fear of Current or Ex-Partner: Not on file  . Emotionally Abused: Not on file  . Physically Abused: Not on file  . Sexually Abused: Not on file      Family History  Problem Relation Age of Onset  . Cerebral aneurysm Mother   . Hypertension Father   . Cerebral aneurysm Maternal Grandfather   . Cerebral aneurysm Maternal Aunt   . Cancer Maternal Uncle     Vitals:   04/12/19 1431  Weight: 126.2 kg (278 lb 3.2 oz)   Wt Readings from Last 3 Encounters:  04/12/19 126.2 kg (278 lb 3.2 oz)  02/21/19 124.6 kg (274 lb 12.8 oz)  02/21/19 125.2 kg (276 lb)    PHYSICAL EXAM: General:  Super morbid obese WM, Well appearing. No respiratory difficulty HEENT: normal Neck: supple. no JVD. Carotids 2+ bilat; no bruits. No lymphadenopathy or thyromegaly appreciated. Cor: PMI nondisplaced. Regular rate & rhythm. No rubs, gallops. 2/6 Murmur RUSB (aortic sclerosis on recent echo. No stenosis   Lungs: clear Abdomen: obese, soft, nontender,  nondistended. No  hepatosplenomegaly. No bruits or masses. Good bowel sounds. Extremities: no cyanosis, clubbing, rash, 1+ bilateral LE edema, w/ bilateral chronic venous stasis dermatitis  Neuro: alert & oriented x 3, cranial nerves grossly intact. moves all 4 extremities w/o difficulty. Affect pleasant.   ASSESSMENT & PLAN: 1. Chronic Diastolic Heart Failure: ECHO 12/2017 EF 65-70% , moderately increased LVH. [place] 12/2018 with preserved cardiac output and low filling pressures. PYP scan not convincing for amyloid.  - NYHA III. No resting dyspnea. Volume overloaded on exam. Will further increase torsemide to 60 mg qam/ 40 mg qpm. Increase K dur to 60 mEq daily. Check BMP today.   -Discussed limiting fluid intake to < 2 liters. Recommend low sodium diet. Pt verbalized she will work on improving compliance w/ dietary orders - at next visit, if diuretic response to torsemide increase is sluggish, will try adding metolazone as adjunct.  -Continue Imdur  for BP control -no a candidate for SGLT2i w/ low GFR -Continue w/ follow up in sleep clinic. New diagnosis of moderate OSA.   2.CKD3b -Baseline creatinine is ~1.6-1.8  -Plan to increase diuretics for CHF per above.  - Check BMP today and again at f/u visit   3. HTN:  -stable on current regimen   4.H/O CHB with MDT PPM-  - Followd EP reprogrammed  to VVI 50   5. DM:  - On insulin.  - Followed by PCP   6. Hypothyroidism  -On levothyroxine. Followed by PCP  7. H/O PEs: - On coumadin. - INRs followed by PCP   8. Obesity Body mass index is 50.88 kg/m. -Discussed portion control.  9. OSA: new diagnosis. Found to have moderate OSA. No device yet. Has f/u w/ Dr.  Radford Pax in sleep clinic 1/19.    Follow up in 1- 2 weeks to reassess volume status       Lyda Jester, PA-C 04/12/19

## 2019-04-12 NOTE — Patient Instructions (Signed)
Lab work done today. We will notify you of any abnormal lab work. No news is good news!  INCREASE Torsemide to 60mg  (3 tabs) every morning and 40mg  (2 tabs) every evening.  INCREASE Potassium to 60 meq (3 tabs) daily.  Please follow up with the [facility] in 2 weeks.  At the [facility], you and your health needs are our priority. As part of our continuing mission to provide you with exceptional heart care, we have created designated Provider Care Teams. These Care Teams include your primary Cardiologist (physician) and Advanced Practice Providers (APPs- Physician Assistants and Nurse Practitioners) who all work together to provide you with the care you need, when you need it.   You may see any of the following providers on your designated Care Team at your next follow up: Marland Kitchen Dr Glori Bickers . Dr Loralie Champagne . Darrick Grinder, NP . Lyda Jester, PA . Audry Riles, PharmD   Please be sure to bring in all your medications bottles to every appointment.

## 2019-04-23 ENCOUNTER — Other Ambulatory Visit (HOSPITAL_COMMUNITY)
Admission: RE | Admit: 2019-04-23 | Discharge: 2019-04-23 | Disposition: A | Discharge status: Home / Self Care | Payer: Medicare HMO | Source: Ambulatory Visit | Attending: Cardiology | Admitting: Cardiology

## 2019-04-23 DIAGNOSIS — Z01812 Encounter for preprocedural laboratory examination: Secondary | ICD-10-CM | POA: Insufficient documentation

## 2019-04-23 DIAGNOSIS — Z20822 Contact with and (suspected) exposure to covid-19: Secondary | ICD-10-CM | POA: Insufficient documentation

## 2019-04-23 LAB — SARS CORONAVIRUS 2 (TAT 6-24 HRS): SARS Coronavirus 2: NEGATIVE

## 2019-04-26 ENCOUNTER — Ambulatory Visit (HOSPITAL_BASED_OUTPATIENT_CLINIC_OR_DEPARTMENT_OTHER): Payer: Medicare HMO | Admitting: Cardiology

## 2019-04-26 ENCOUNTER — Encounter (HOSPITAL_COMMUNITY): Payer: Medicare HMO

## 2019-05-02 ENCOUNTER — Ambulatory Visit (HOSPITAL_COMMUNITY)
Admission: RE | Admit: 2019-05-02 | Discharge: 2019-05-02 | Disposition: A | Discharge status: Home / Self Care | Payer: Medicare HMO | Source: Ambulatory Visit | Attending: Adult Health | Admitting: Adult Health

## 2019-05-02 ENCOUNTER — Telehealth (HOSPITAL_COMMUNITY): Payer: Self-pay

## 2019-05-02 ENCOUNTER — Other Ambulatory Visit: Payer: Self-pay

## 2019-05-02 ENCOUNTER — Encounter (HOSPITAL_COMMUNITY): Payer: Self-pay

## 2019-05-02 VITALS — BP 136/88 | HR 78 | Wt 268.4 lb

## 2019-05-02 DIAGNOSIS — I4891 Unspecified atrial fibrillation: Secondary | ICD-10-CM | POA: Diagnosis not present

## 2019-05-02 DIAGNOSIS — Z950 Presence of cardiac pacemaker: Secondary | ICD-10-CM | POA: Diagnosis not present

## 2019-05-02 DIAGNOSIS — M1990 Unspecified osteoarthritis, unspecified site: Secondary | ICD-10-CM | POA: Diagnosis not present

## 2019-05-02 DIAGNOSIS — Z79899 Other long term (current) drug therapy: Secondary | ICD-10-CM | POA: Diagnosis not present

## 2019-05-02 DIAGNOSIS — F329 Major depressive disorder, single episode, unspecified: Secondary | ICD-10-CM | POA: Diagnosis not present

## 2019-05-02 DIAGNOSIS — G4733 Obstructive sleep apnea (adult) (pediatric): Secondary | ICD-10-CM | POA: Diagnosis not present

## 2019-05-02 DIAGNOSIS — Z8249 Family history of ischemic heart disease and other diseases of the circulatory system: Secondary | ICD-10-CM | POA: Insufficient documentation

## 2019-05-02 DIAGNOSIS — Z79890 Hormone replacement therapy (postmenopausal): Secondary | ICD-10-CM | POA: Diagnosis not present

## 2019-05-02 DIAGNOSIS — E039 Hypothyroidism, unspecified: Secondary | ICD-10-CM | POA: Diagnosis not present

## 2019-05-02 DIAGNOSIS — Z87891 Personal history of nicotine dependence: Secondary | ICD-10-CM | POA: Insufficient documentation

## 2019-05-02 DIAGNOSIS — E669 Obesity, unspecified: Secondary | ICD-10-CM | POA: Insufficient documentation

## 2019-05-02 DIAGNOSIS — K219 Gastro-esophageal reflux disease without esophagitis: Secondary | ICD-10-CM | POA: Diagnosis not present

## 2019-05-02 DIAGNOSIS — I5032 Chronic diastolic (congestive) heart failure: Secondary | ICD-10-CM | POA: Diagnosis not present

## 2019-05-02 DIAGNOSIS — G8929 Other chronic pain: Secondary | ICD-10-CM | POA: Insufficient documentation

## 2019-05-02 DIAGNOSIS — Z86711 Personal history of pulmonary embolism: Secondary | ICD-10-CM | POA: Insufficient documentation

## 2019-05-02 DIAGNOSIS — Z885 Allergy status to narcotic agent status: Secondary | ICD-10-CM | POA: Diagnosis not present

## 2019-05-02 DIAGNOSIS — Z7901 Long term (current) use of anticoagulants: Secondary | ICD-10-CM | POA: Insufficient documentation

## 2019-05-02 DIAGNOSIS — I442 Atrioventricular block, complete: Secondary | ICD-10-CM | POA: Insufficient documentation

## 2019-05-02 DIAGNOSIS — Z6841 Body Mass Index (BMI) 40.0 and over, adult: Secondary | ICD-10-CM | POA: Diagnosis not present

## 2019-05-02 DIAGNOSIS — Z794 Long term (current) use of insulin: Secondary | ICD-10-CM | POA: Insufficient documentation

## 2019-05-02 DIAGNOSIS — M109 Gout, unspecified: Secondary | ICD-10-CM | POA: Insufficient documentation

## 2019-05-02 DIAGNOSIS — I130 Hypertensive heart and chronic kidney disease with heart failure and stage 1 through stage 4 chronic kidney disease, or unspecified chronic kidney disease: Secondary | ICD-10-CM | POA: Insufficient documentation

## 2019-05-02 DIAGNOSIS — Z86718 Personal history of other venous thrombosis and embolism: Secondary | ICD-10-CM | POA: Insufficient documentation

## 2019-05-02 DIAGNOSIS — N1832 Chronic kidney disease, stage 3b: Secondary | ICD-10-CM | POA: Insufficient documentation

## 2019-05-02 DIAGNOSIS — E1122 Type 2 diabetes mellitus with diabetic chronic kidney disease: Secondary | ICD-10-CM | POA: Diagnosis not present

## 2019-05-02 DIAGNOSIS — R0683 Snoring: Secondary | ICD-10-CM

## 2019-05-02 LAB — BASIC METABOLIC PANEL
Anion gap: 10 (ref 5–15)
BUN: 44 mg/dL — ABNORMAL HIGH (ref 8–23)
CO2: 29 mmol/L (ref 22–32)
Calcium: 9.2 mg/dL (ref 8.9–10.3)
Chloride: 100 mmol/L (ref 98–111)
Creatinine, Ser: 1.81 mg/dL — ABNORMAL HIGH (ref 0.44–1.00)
GFR calc Af Amer: 34 mL/min — ABNORMAL LOW (ref >60)
GFR calc non Af Amer: 29 mL/min — ABNORMAL LOW (ref >60)
Glucose, Bld: 134 mg/dL — ABNORMAL HIGH (ref 70–99)
Potassium: 4 mmol/L (ref 3.5–5.1)
Sodium: 139 mmol/L (ref 135–145)

## 2019-05-02 NOTE — Progress Notes (Signed)
Advanced Heart Failure Clinic Note    PCP: [name], MD PCP-HF Cardiologist: Dr Haroldine Laws   HPI: [name] is a 64 y/o female w/ h/o obesity, HTN, DM, atrial fib, DVT, PE x3 , s/p IVC filter placed in 2013, CHB s/p MDT single chamber PPM 08/2018, and chronic diastolic heart failure. In 2013, she had sleep study with mild sleep apnea noted.   R/LHC in 2016 showed no significant coronary diseased, cardiac output 8.9 and PA pressured 41/13.    In January 2020 she had presyncopal episode and had some intermittent dizziness.  She told her PCP and was instructed to keep a log. In May she had presyncope and a syncopal episode. She had a fall off her porch. This was determined to be in the setting of CHB. MDT single chamber PPM placed. She also had minimally displaced C6 fracture.  She has functional limitation at baseline due to chronic knee pain. She uses a walker for ambulation.    She recently presented to [facility] on 12/26/18  w/ dyspnea and cough. Initial w/u in the ED c/w acute on chronic diastolic HF. Admitted and started on IV diuretics. 2D echo showed EF 65-70% w/ pseudonormalization of LV consistent with diastolic dysfunction. She was diuresed w/ Lasix. RHC completed with preserved cardiac output and low filling pressures. PYP scan not convincing for amyloid. Outpatient sleep study recommended. EP saw 9/22 reprogrammed to VVI 50 due to asynchronous V pacing.Prior to d/c was transitioned to PO torsemide 20 mg daily. Also sent home on Hydralazine 25 mg three times a day, Imdur 60 mg daily, Crestor 10 mg daily. Her discharge wt was 117 kg (257 lb).  She had repeat sleep study 02/25/19 and found to have moderate OSA. No device yet. This is being followed by Dr. Radford Pax. She has f/u visit on 04/26/19.   Today she returns for HF follow up. Last visit torsemide was increased to 60 mg/ 40 mg. Overall feeling fine but having ongoing back and next pain. Ongoing shortness of breath with exertion.  Denies PND/Orthopnea. No bleeding issues. Appetite ok. No fever or chills. Weight at home 261-266 pounds. Taking all medications.   Echo 12/27/18  1. Left ventricular ejection fraction, by visual estimation, is 65 to 70%. The left ventricle has normal function. Normal left ventricular size. There is moderately increased left ventricular hypertrophy. 2. Left ventricular diastolic Doppler parameters are consistent with pseudonormalization pattern of LV diastolic filling. 3. Global right ventricle has normal systolic function.The right ventricular size is normal. No increase in right ventricular wall thickness. 4. Left atrial size was mildly dilated. 5. Right atrial size was normal. 6. Mild to moderate aortic valve annular calcification. 7. Moderate to severe mitral annular calcification. 8. The mitral valve is normal in structure. No evidence of mitral valve regurgitation. No evidence of mitral stenosis. 9. The tricuspid valve is normal in structure. Tricuspid valve regurgitation was not visualized by color flow Doppler. 10. The aortic valve is normal in structure. Aortic valve regurgitation was not visualized by color flow Doppler. Mild to moderate aortic valve sclerosis/calcification without any evidence of aortic stenosis. 11. The pulmonic valve was normal in structure. Pulmonic valve regurgitation is not visualized by color flow Doppler. 12. Normal pulmonary artery systolic pressure. 13. A pacer wire is visualized. 14. The inferior vena cava is normal in size with greater than 50% respiratory variability, suggesting right atrial pressure of 3 mmHg.   Past Medical History:  Diagnosis Date  . A-fib ([place])   .  Anemia   . Anxiety   . Arthritis    "right hip; both knees; left wrist/shoulder; back" (01/19/2013"  . Bleeding on Coumadin 08/2012; 01/18/2013   [facility] admissions (01/19/2013)  . CHF (congestive heart failure) ([place])    "2-3 times" (01/19/2013)  . Chronic lower back pain   .  Depression   . DVT (deep venous thrombosis) ([place]) 10 years ago   numerous/notes 01/18/2013  . GERD (gastroesophageal reflux disease)   . Gout   . [id number])    "maybe weekly" (01/19/2013)  . Heart murmur   . High cholesterol    "been off RX for this at one time" (01/18/2013)  . History of blood transfusion 1983; 04/2012   "3 w/ childbirth; hospitalized for pain" (01/19/2013)  . Hypertension   . Hypothyroidism   . Migraines    "twice/yr maybe" (01/19/2013)  . OSA (obstructive sleep apnea)    "sent me for test in 04/2012; never ordered mask, etc" (01/19/2013)  . PE (pulmonary thromboembolism) ([place]) 3 years ag0   3/notes 01/18/2013  . Pneumonia before 2011   "once' (01/18/2013)  . Renal disorder    kindey function low; "Metformin was destroying my kidneys" (01/19/2013)  . Shortness of breath    "only related to my CHF" (01/18/2013)  . Swelling of hand 08/31/2014   RT HAND  . Type II diabetes mellitus ([place])   . UTI (urinary tract infection) 08/31/2014    Current Outpatient Medications  Medication Sig Dispense Refill  . acetaminophen (TYLENOL) 500 MG tablet Take 500 mg by mouth every 6 (six) hours as needed for fever or headache (pain).    Marland Kitchen allopurinol (ZYLOPRIM) 300 MG tablet Take 300 mg by mouth daily.    . Biotin 1000 MCG tablet Take 500 mcg by mouth daily with lunch.     . Calcium Citrate-Vitamin D (CITRACAL/VITAMIN D PO) Take 2 tablets by mouth every evening.    . citalopram (CELEXA) 20 MG tablet Take 20 mg by mouth at bedtime.    . ferrous sulfate 325 (65 FE) MG tablet Take 325 mg by mouth daily.    . insulin aspart (NOVOLOG) 100 UNIT/ML injection Inject 2 Units into the skin 3 (three) times daily with meals. 10 mL 11  . Insulin Detemir (LEVEMIR FLEXTOUCH) 100 UNIT/ML Pen Inject 20 Units into the skin daily before breakfast.     . isosorbide mononitrate (IMDUR) 60 MG 24 hr tablet Take 1 tablet (60 mg total) by mouth daily. 90 tablet 0  . levothyroxine (SYNTHROID) 75 MCG  tablet Take 75 mcg by mouth daily before breakfast.     . Melatonin 5 MG TABS Take 10 mg by mouth at bedtime.     . Multiple Vitamin (MULITIVITAMIN WITH MINERALS) TABS Take 1 tablet by mouth daily.    . Omega-3 Fatty Acids (FISH OIL) 1000 MG CAPS Take 1,000 mg by mouth daily.     . potassium chloride SA (KLOR-CON) 20 MEQ tablet Take 3 tablets (60 mEq total) by mouth daily. 270 tablet 3  . rosuvastatin (CRESTOR) 10 MG tablet Take 1 tablet (10 mg total) by mouth daily. 30 tablet 0  . Semaglutide,0.25 or 0.5MG /DOS, (OZEMPIC, 0.25 OR 0.5 MG/DOSE,) 2 MG/1.5ML SOPN Inject 0.5 mg into the skin once a week.    . torsemide (DEMADEX) 20 MG tablet Take 3 tablets (60 mg total) by mouth every morning AND 2 tablets (40 mg total) every evening. 450 tablet 3  . warfarin (COUMADIN) 2.5 MG tablet TAKE 7.5 MG ON  M-W-F AND 5 MG TUES, THURS, SAT AND SUN AS DIRECTED 120 tablet 1   No current facility-administered medications for this encounter.    Allergies  Allergen Reactions  . Morphine And Related Rash      Social History   Socioeconomic History  . Marital status: Single    Spouse name: Not on file  . Number of children: Not on file  . Years of education: Not on file  . Highest education level: Not on file  Occupational History  . Occupation: disabled  Tobacco Use  . Smoking status: Former Smoker    Packs/day: 0.05    Years: 30.00    Pack years: 1.50    Types: Cigarettes    Quit date: 1990    Years since quitting: 31.0  . Smokeless tobacco: Never Used  . Tobacco comment: 01/19/2013 "quit smoking cigarettes in the early '90's"  Substance and Sexual Activity  . Alcohol use: Not Currently    Comment: rare now but never a heavy drinker  . Drug use: No  . Sexual activity: Never  Other Topics Concern  . Not on file  Social History Narrative  . Not on file   Social Determinants of Health   Financial Resource Strain:   . Difficulty of Paying Living Expenses: Not on file  Food Insecurity:     . Worried About Charity fundraiser in the Last Year: Not on file  . Ran Out of Food in the Last Year: Not on file  Transportation Needs:   . Lack of Transportation (Medical): Not on file  . Lack of Transportation (Non-Medical): Not on file  Physical Activity:   . Days of Exercise per Week: Not on file  . Minutes of Exercise per Session: Not on file  Stress:   . Feeling of Stress : Not on file  Social Connections:   . Frequency of Communication with Friends and Family: Not on file  . Frequency of Social Gatherings with Friends and Family: Not on file  . Attends Religious Services: Not on file  . Active Member of Clubs or Organizations: Not on file  . Attends Archivist Meetings: Not on file  . Marital Status: Not on file  Intimate Partner Violence:   . Fear of Current or Ex-Partner: Not on file  . Emotionally Abused: Not on file  . Physically Abused: Not on file  . Sexually Abused: Not on file      Family History  Problem Relation Age of Onset  . Cerebral aneurysm Mother   . Hypertension Father   . Cerebral aneurysm Maternal Grandfather   . Cerebral aneurysm Maternal Aunt   . Cancer Maternal Uncle     Vitals:   05/02/19 0838  BP: 136/88  Pulse: 78  SpO2: 98%  Weight: 121.7 kg (268 lb 6.4 oz)   Wt Readings from Last 3 Encounters:  05/02/19 121.7 kg (268 lb 6.4 oz)  04/12/19 126.2 kg (278 lb 3.2 oz)  02/21/19 124.6 kg (274 lb 12.8 oz)    PHYSICAL EXAM: General:  Walked in the clinic.  No resp difficulty HEENT: normal Neck: supple. no JVD. Carotids 2+ bilat; no bruits. No lymphadenopathy or thryomegaly appreciated. Cor: PMI nondisplaced. Regular rate & rhythm. No rubs, gallops or murmurs. Lungs: clear Abdomen: soft, nontender, nondistended. No hepatosplenomegaly. No bruits or masses. Good bowel sounds. Extremities: no cyanosis, clubbing, rash, R and LLE chronic hyperpigmentation.  Neuro: alert & orientedx3, cranial nerves grossly intact. moves all 4  extremities  w/o difficulty. Affect pleasant   ASSESSMENT & PLAN: 1. Chronic Diastolic Heart Failure: ECHO 12/2017 EF 65-70% , moderately increased LVH. [place] 12/2018 with preserved cardiac output and low filling pressures. PYP scan not convincing for amyloid.  - NYHA III, chronically - Volume status stable.  Continue torsemide to 60 mg qam/ 40 mg qpm. Continue K dur to 60 mEq daily. Check BMP today.   -Continue Imdur   -no a candidate for SGLT2i w/ low GFR -Continue w/ follow up in sleep clinic. New diagnosis of moderate OSA.   2.CKD3b -Baseline creatinine is ~1.6-1.8  -Check BMET   3. HTN:  - Stable.   4.H/O CHB with MDT PPM-  - Followd EP reprogrammed  to VVI 50   5. DM:  - On insulin.  - Followed by PCP   6. Hypothyroidism  -On levothyroxine. Followed by PCP  7. H/O PEs: - On coumadin. - INRs followed by PCP   8. Obesity Body mass index is 49.09 kg/m. - Discussed portion control.   9. OSA: new diagnosis. Found to have moderate OSA. No device yet. Has f/u w/ Dr.  Radford Pax in sleep clinic 1/19. She cancelled her sleep study but plans to set up today.    Follow up in 3 months with Dr Haroldine Laws.      Darrick Grinder, NP 05/02/19

## 2019-05-02 NOTE — Telephone Encounter (Signed)
Spoke with Patient today at her Appointment and she stated that she canceled her Split Night Sleep Study ( scheduled for 04/26/2019 at [facility] ) because she didn't feel well and would reschedule the appointment herself.  Notified Amy Ninfa Meeker of the Patient decision.

## 2019-05-02 NOTE — Patient Instructions (Signed)
No medication changes today!   Labs today We will only contact you if something comes back abnormal or we need to make some changes. Otherwise no news is good news!   Your physician recommends that you schedule a follow-up appointment in: 3 months with Dr Haroldine Laws   Please call office at [phone] option [phone] if you have any questions or concerns.     At the [facility], you and your health needs are our priority. As part of our continuing mission to provide you with exceptional heart care, we have created designated Provider Care Teams. These Care Teams include your primary Cardiologist (physician) and Advanced Practice Providers (APPs- Physician Assistants and Nurse Practitioners) who all work together to provide you with the care you need, when you need it.   You may see any of the following providers on your designated Care Team at your next follow up: Marland Kitchen Dr Glori Bickers . Dr Loralie Champagne . Darrick Grinder, NP . Lyda Jester, PA . Audry Riles, PharmD   Please be sure to bring in all your medications bottles to every appointment.

## 2019-05-16 ENCOUNTER — Other Ambulatory Visit (HOSPITAL_COMMUNITY)
Admission: RE | Admit: 2019-05-16 | Discharge: 2019-05-16 | Disposition: A | Discharge status: Home / Self Care | Payer: Medicare HMO | Source: Ambulatory Visit | Attending: Cardiology | Admitting: Cardiology

## 2019-05-16 DIAGNOSIS — Z20822 Contact with and (suspected) exposure to covid-19: Secondary | ICD-10-CM | POA: Diagnosis not present

## 2019-05-16 DIAGNOSIS — Z01812 Encounter for preprocedural laboratory examination: Secondary | ICD-10-CM | POA: Insufficient documentation

## 2019-05-16 LAB — SARS CORONAVIRUS 2 (TAT 6-24 HRS): SARS Coronavirus 2: NEGATIVE

## 2019-05-18 ENCOUNTER — Ambulatory Visit (HOSPITAL_BASED_OUTPATIENT_CLINIC_OR_DEPARTMENT_OTHER): Payer: Medicare HMO | Attending: Cardiology | Admitting: Cardiology

## 2019-05-18 ENCOUNTER — Ambulatory Visit: Payer: Medicare HMO | Admitting: Cardiovascular Disease

## 2019-05-18 ENCOUNTER — Encounter: Payer: Self-pay | Admitting: Cardiovascular Disease

## 2019-05-18 ENCOUNTER — Other Ambulatory Visit: Payer: Self-pay

## 2019-05-18 VITALS — BP 148/78 | HR 74 | Ht 62.0 in | Wt 274.0 lb

## 2019-05-18 DIAGNOSIS — Z95828 Presence of other vascular implants and grafts: Secondary | ICD-10-CM

## 2019-05-18 DIAGNOSIS — Z86711 Personal history of pulmonary embolism: Secondary | ICD-10-CM

## 2019-05-18 DIAGNOSIS — I5032 Chronic diastolic (congestive) heart failure: Secondary | ICD-10-CM | POA: Diagnosis not present

## 2019-05-18 DIAGNOSIS — N1832 Chronic kidney disease, stage 3b: Secondary | ICD-10-CM

## 2019-05-18 DIAGNOSIS — E669 Obesity, unspecified: Secondary | ICD-10-CM

## 2019-05-18 DIAGNOSIS — Z794 Long term (current) use of insulin: Secondary | ICD-10-CM

## 2019-05-18 DIAGNOSIS — I4811 Longstanding persistent atrial fibrillation: Secondary | ICD-10-CM | POA: Diagnosis not present

## 2019-05-18 DIAGNOSIS — G4733 Obstructive sleep apnea (adult) (pediatric): Secondary | ICD-10-CM | POA: Diagnosis not present

## 2019-05-18 DIAGNOSIS — N183 Chronic kidney disease, stage 3 unspecified: Secondary | ICD-10-CM

## 2019-05-18 DIAGNOSIS — E7800 Pure hypercholesterolemia, unspecified: Secondary | ICD-10-CM

## 2019-05-18 DIAGNOSIS — Z950 Presence of cardiac pacemaker: Secondary | ICD-10-CM

## 2019-05-18 DIAGNOSIS — I447 Left bundle-branch block, unspecified: Secondary | ICD-10-CM

## 2019-05-18 DIAGNOSIS — E1122 Type 2 diabetes mellitus with diabetic chronic kidney disease: Secondary | ICD-10-CM

## 2019-05-18 DIAGNOSIS — Z7901 Long term (current) use of anticoagulants: Secondary | ICD-10-CM

## 2019-05-18 NOTE — Progress Notes (Signed)
Cardiology Office Note:    Date:  05/25/2019   ID:  [name], DOB 01-30-1956, MRN [id number]  PCP:  Leah Panda, MD  Cardiologist:  No primary care provider on file.  Electrophysiologist:  None   Referring MD: Leah Panda, MD   Chief Complaint  Patient presents with  . Congestive Heart Failure    History of Present Illness:    [name] is a 64 y.o. female with a hx of of numerous medical problems including paroxysmal atrial fibrillation, chronic diastolic heart failure, history of DVT and pulmonary embolism on 3 separate occasions (IVC filter placed 2013 still in place), complete heart block, single-chamber Medtronic pacemaker (May 2020), OSA (mild by sleep study 2013 by repeat study in November 2020, but not yet on CPAP), super obesity, mild PAH, hypertension, diabetes mellitus.  This is my first encounter with her since 2013.  In the meantime she has been followed in the heart failure clinic.    She had a syncopal event in January 2020 and another event in May after which she was diagnosed with intermittent complete heart block and a pacemaker was implanted (followed by Dr. Rayann [name]).  The diagnosis was symptomatic bradycardia due to Mobitz type II second-degree AV block, permanent A. fib/atypical atrial flutter.  She only requires pacing roughly 2% of the time.  She presents today in atrial fibrillation, rate controlled and has left bundle branch block with left axis deviation.  However, she has had documentation of sinus bradycardia with native AV conduction in September 2020 and her pacemaker is programmed with a lower rate limit of 50 bpm to prevent asynchronous pacing.  In September 2020 she presented with acute dyspnea and was felt to have exacerbation of diastolic heart failure and was hospitalized for intravenous diuretics.  Her echocardiogram showed LVEF of 65-70% and pseudonormal mitral inflow (although she she was not in normal AV synchrony when the echo was  performed).  Right heart catheterization confirmed preserved cardiac output and filling pressures were determined to be low.  Pyrophosphate scan did not show evidence of amyloidosis.  Her discharge weight was 117 kg (257 pounds).    Today she weighs 17 pounds more.  She tells me that her weight has been as low as 240 pounds at home, but is usually 260-265 pounds.  She has substantially more swelling than usual today.  She reports "trying to not eat salt" and is compliant with her torsemide (total of 100 mg daily).  As far as I can tell she does not have orthopnea or PND.  She denies angina pectoris.  She has not had palpitations or syncope.  She is on chronic warfarin anticoagulation which is monitored by Dr. Mellody [name].  She denies falls, injuries or bleeding problems.  Glycemic control is good with a hemoglobin A1c of 6.3% a few months ago, on all Ozempic and insulin.  Past Medical History:  Diagnosis Date  . A-fib ([place])   . Anemia   . Anxiety   . Arthritis    "right hip; both knees; left wrist/shoulder; back" (01/19/2013"  . Bleeding on Coumadin 08/2012; 01/18/2013   [facility] admissions (01/19/2013)  . CHF (congestive heart failure) ([place])    "2-3 times" (01/19/2013)  . Chronic lower back pain   . Depression   . DVT (deep venous thrombosis) ([place]) 10 years ago   numerous/notes 01/18/2013  . GERD (gastroesophageal reflux disease)   . Gout   . [id number])    "maybe weekly" (01/19/2013)  . Heart murmur   .  High cholesterol    "been off RX for this at one time" (01/18/2013)  . History of blood transfusion 1983; 04/2012   "3 w/ childbirth; hospitalized for pain" (01/19/2013)  . Hypertension   . Hypothyroidism   . Migraines    "twice/yr maybe" (01/19/2013)  . OSA (obstructive sleep apnea)    "sent me for test in 04/2012; never ordered mask, etc" (01/19/2013)  . PE (pulmonary thromboembolism) ([place]) 3 years ag0   3/notes 01/18/2013  . Pneumonia before 2011   "once' (01/18/2013)  . Renal  disorder    kindey function low; "Metformin was destroying my kidneys" (01/19/2013)  . Shortness of breath    "only related to my CHF" (01/18/2013)  . Swelling of hand 08/31/2014   RT HAND  . Type II diabetes mellitus ([place])   . UTI (urinary tract infection) 08/31/2014    Past Surgical History:  Procedure Laterality Date  . CARDIAC CATHETERIZATION N/A 03/13/2015   Procedure: Right/Left Heart Cath and Coronary Angiography;  Surgeon: Leah Prows, MD;  Location: [facility];  Service: Cardiovascular;  Laterality: N/A;  . CATARACT EXTRACTION W/ INTRAOCULAR LENS  IMPLANT, BILATERAL Bilateral 2006-2011  . CESAREAN SECTION  1983  . CHOLECYSTECTOMY  ~ 2002  . COLONOSCOPY N/A 01/21/2013   Procedure: COLONOSCOPY;  Surgeon: Leah Beams, MD;  Location: [facility];  Service: Endoscopy;  Laterality: N/A;  . EYE SURGERY Bilateral    "multiple" (01/18/2013)  . PACEMAKER IMPLANT N/A 08/31/2018   Symptomatic bradycardia due to mobitz II second degree AV block, permanent afib/ atypical atrial flutter implanted by Dr [name]  . PARS PLANA REPAIR OF RETINAL DEATACHMENT Right   . PARS PLANA VITRECTOMY Bilateral 2004-2006   "several" (01/18/2013)  . REFRACTIVE SURGERY Bilateral    "for stigmatism" (01/18/2013)  . REFRACTIVE SURGERY Left ~ 11/2012   "to puff it up cause vision got hazy" (01/18/2013)  . RIGHT HEART CATH N/A 12/30/2018   Procedure: RIGHT HEART CATH;  Surgeon: Leah Artist, MD;  Location: [facility];  Service: Cardiovascular;  Laterality: N/A;  . VENA CAVA FILTER PLACEMENT  2011?    Current Medications: Current Meds  Medication Sig  . acetaminophen (TYLENOL) 500 MG tablet Take 500 mg by mouth every 6 (six) hours as needed for fever or headache (pain).  Marland Kitchen allopurinol (ZYLOPRIM) 300 MG tablet Take 300 mg by mouth daily.  . Biotin 1000 MCG tablet Take 500 mcg by mouth daily with lunch.   . Calcium Citrate-Vitamin D (CITRACAL/VITAMIN D PO) Take 2 tablets by mouth every  evening.  . citalopram (CELEXA) 20 MG tablet Take 20 mg by mouth at bedtime.  . ferrous sulfate 325 (65 FE) MG tablet Take 325 mg by mouth daily.  . insulin aspart (NOVOLOG) 100 UNIT/ML injection Inject 2 Units into the skin 3 (three) times daily with meals.  . Insulin Detemir (LEVEMIR FLEXTOUCH) 100 UNIT/ML Pen Inject 20 Units into the skin daily before breakfast.   . levothyroxine (SYNTHROID) 75 MCG tablet Take 75 mcg by mouth daily before breakfast.   . Melatonin 5 MG TABS Take 10 mg by mouth at bedtime.   . Multiple Vitamin (MULITIVITAMIN WITH MINERALS) TABS Take 1 tablet by mouth daily.  . Omega-3 Fatty Acids (FISH OIL) 1000 MG CAPS Take 1,000 mg by mouth daily.   . potassium chloride SA (KLOR-CON) 20 MEQ tablet Take 3 tablets (60 mEq total) by mouth daily.  . rosuvastatin (CRESTOR) 10 MG tablet Take 1 tablet (10 mg total)  by mouth daily.  . Semaglutide,0.25 or 0.5MG /DOS, (OZEMPIC, 0.25 OR 0.5 MG/DOSE,) 2 MG/1.5ML SOPN Inject 0.5 mg into the skin once a week.  . torsemide (DEMADEX) 20 MG tablet Take 3 tablets (60 mg total) by mouth every morning AND 2 tablets (40 mg total) every evening.  . warfarin (COUMADIN) 2.5 MG tablet TAKE 7.5 MG ON M-W-F AND 5 MG TUES, THURS, SAT AND SUN AS DIRECTED (Patient taking differently: Pt taking 5 mg six days per week, and 7 mg on the seventh day.)     Allergies:   Morphine and related   Social History   Socioeconomic History  . Marital status: Single    Spouse name: Not on file  . Number of children: Not on file  . Years of education: Not on file  . Highest education level: Not on file  Occupational History  . Occupation: disabled  Tobacco Use  . Smoking status: Former Smoker    Packs/day: 0.05    Years: 30.00    Pack years: 1.50    Types: Cigarettes    Quit date: 1990    Years since quitting: 31.1  . Smokeless tobacco: Never Used  . Tobacco comment: 01/19/2013 "quit smoking cigarettes in the early '90's"  Substance and Sexual Activity  .  Alcohol use: Not Currently    Comment: rare now but never a heavy drinker  . Drug use: No  . Sexual activity: Never  Other Topics Concern  . Not on file  Social History Narrative  . Not on file   Social Determinants of Health   Financial Resource Strain:   . Difficulty of Paying Living Expenses: Not on file  Food Insecurity:   . Worried About Charity fundraiser in the Last Year: Not on file  . Ran Out of Food in the Last Year: Not on file  Transportation Needs:   . Lack of Transportation (Medical): Not on file  . Lack of Transportation (Non-Medical): Not on file  Physical Activity:   . Days of Exercise per Week: Not on file  . Minutes of Exercise per Session: Not on file  Stress:   . Feeling of Stress : Not on file  Social Connections:   . Frequency of Communication with Friends and Family: Not on file  . Frequency of Social Gatherings with Friends and Family: Not on file  . Attends Religious Services: Not on file  . Active Member of Clubs or Organizations: Not on file  . Attends Archivist Meetings: Not on file  . Marital Status: Not on file     Family History: The patient's family history includes Cancer in her maternal uncle; Cerebral aneurysm in her maternal aunt, maternal grandfather, and mother; Hypertension in her father.  ROS:   Please see the history of present illness.     All other systems reviewed and are negative.  EKGs/Labs/Other Studies Reviewed:    The following studies were reviewed today:  Echo9/21/20 1. Left ventricular ejection fraction, by visual estimation, is 65 to 70%. The left ventricle has normal function. Normal left ventricular size. There is moderately increased left ventricular hypertrophy. 2. Left ventricular diastolic Doppler parameters are consistent with pseudonormalization pattern of LV diastolic filling. 3. Global right ventricle has normal systolic function.The right ventricular size is normal. No increase in right  ventricular wall thickness. 4. Left atrial size was mildly dilated. 5. Right atrial size was normal. 6. Mild to moderate aortic valve annular calcification. 7. Moderate to severe mitral annular  calcification. 8. The mitral valve is normal in structure. No evidence of mitral valve regurgitation. No evidence of mitral stenosis. 9. The tricuspid valve is normal in structure. Tricuspid valve regurgitation was not visualized by color flow Doppler. 10. The aortic valve is normal in structure. Aortic valve regurgitation was not visualized by color flow Doppler. Mild to moderate aortic valve sclerosis/calcification without any evidence of aortic stenosis. 11. The pulmonic valve was normal in structure. Pulmonic valve regurgitation is not visualized by color flow Doppler. 12. Normal pulmonary artery systolic pressure. 13. A pacer wire is visualized. 14. The inferior vena cava is normal in size with greater than 50% respiratory variability, suggesting right atrial pressure of 3 mmHg.  Right heart catheterization 12/30/2018 RA = 4 RV = 41/8 PA = 40/14 (24) PCW = 13 Fick cardiac output/index = 8.3/3.9 PVR = 1.3 WU Ao sat = 99% PA sat = 79&, 80% High SVC sat = 80%  Assessment:  1. Well compensated volume status 2. Minimal pulmonary HTN 3. High cardiac output without evidence of intracardiac shunting (suspect peripheral shunt which is common in obese patients)   Plan/Discussion:  Would hold diuretics for another day or two. Can resume gradually.   Pacemaker download 03/03/2019 Single-chamber device.  Heart rate histogram show 2 peaks suggesting that she may have periods of sinus rhythm and atrial fibrillation.  RVR is not frequent.  Lower rate limit is set at 50 and she only has 1.8% ventricular pacing.  Activity level is actually surprisingly good at about 4-5 hours a day lead parameters are good and estimated generator longevity is 13 years  EKG:  EKG is  ordered today.  The ekg  ordered today demonstrates atrial fibrillation, left bundle branch block, left axis deviation, QTC 495 ms  Recent Labs: 08/29/2018: ALT 27; TSH 1.773 12/26/2018: B Natriuretic Peptide 430.9 12/27/2018: Platelets 162 12/30/2018: Hemoglobin 11.9 05/02/2019: BUN 44; Creatinine, Ser 1.81; Potassium 4.0; Sodium 139  Recent Lipid Panel    Component Value Date/Time   CHOL 107 08/30/2018 0319   TRIG 48 08/30/2018 0319   HDL 49 08/30/2018 0319   CHOLHDL 2.2 08/30/2018 0319   VLDL 10 08/30/2018 0319   LDLCALC 48 08/30/2018 0319    Physical Exam:    VS:  BP (!) 148/78   Pulse 74   Ht 5\' 2"  (1.575 m)   Wt 274 lb (124.3 kg)   BMI 50.12 kg/m     Wt Readings from Last 3 Encounters:  05/18/19 276 lb (125.2 kg)  05/18/19 274 lb (124.3 kg)  05/02/19 268 lb 6.4 oz (121.7 kg)     GEN: Super obese well nourished, well developed in no acute distress HEENT: Normal NECK: No JVD; No carotid bruits LYMPHATICS: No lymphadenopathy CARDIAC: Irregular , no murmurs, rubs, gallops.  Healthy left subclavian pacemaker site RESPIRATORY:  Clear to auscultation without rales, wheezing or rhonchi  ABDOMEN: Soft, non-tender, non-distended MUSCULOSKELETAL:  No edema; No deformity  SKIN: Warm and dry NEUROLOGIC:  Alert and oriented x 3 PSYCHIATRIC:  Normal affect   ASSESSMENT:    1. Chronic diastolic heart failure ([name])   2. OSA (obstructive sleep apnea)   3. Longstanding persistent atrial fibrillation ([name])   4. Pacemaker   5. LBBB (left bundle branch block)   6. Super obese   7. Controlled type 2 diabetes mellitus with stage 3 chronic kidney disease, with long-term current use of insulin ([name])   8. Stage 3b chronic kidney disease   9. Hypercholesterolemia   10.  History of pulmonary embolism   11. Presence of IVC filter   12. Anticoagulant long-term use    PLAN:    In order of problems listed above:  1. CHF: She has a little more edema than usual and her weight is about 10 pounds above her  previously estimated "dry weight" but she is not having any respiratory difficulty and has significant elevation in her creatinine at 1.8.  Since she has almost entirely right heart failure findings, she is likely to tolerate volume gain reasonably well.  Would prefer not to increase her diuretics at this time. 2. OSA: More importantly, we need to try to work on her right heart failure by providing CPAP therapy and encouraging her to lose weight.  She is actually scheduled for a CPAP titration study tonight. 3. AFib: Well rate controlled without medications.  Has had periods of severe bradycardia and has a pacemaker in place 4. Pacemaker: Although at the time of implantation was felt that she had permanent atrial fibrillation she came back with sinus bradycardia native AV conduction and therefore has her device programmed at a lower rate limit of only 50 bpm to allow native AV conduction. 5. LBBB: Preserved LVEF.  No indication for CRT. 6. Super obese: Has multiple comorbidities related to this including heart failure and diabetes. 7. DM w CKD 3b: Over the last few months creatinine has varied between 1.54 and 1.86, GFR around 30.  Glycemic control is excellent. 8. HLP: All lipid parameters are in target range on rosuvastatin. 9. 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 10. Anticoagulation: Indicated for both A. fib and venous thromboembolic disease.  Monitored by PCP.   Medication Adjustments/Labs and Tests Ordered: Current medicines are reviewed at length with the patient today.  Concerns regarding medicines are outlined above.  Orders Placed This Encounter  Procedures  . EKG 12-Lead   No orders of the defined types were placed in this encounter.   Patient Instructions  Medication Instructions:  No changes *If you need a refill on your cardiac medications before your next appointment, please call your pharmacy*  Lab Work: None ordered If you have  labs (blood work) drawn today and your tests are completely normal, you will receive your results only by: Marland Kitchen MyChart Message (if you have MyChart) OR . A paper copy in the mail If you have any lab test that is abnormal or we need to change your treatment, we will call you to review the results.  Testing/Procedures: None ordered  Follow-Up: At [facility], you and your health needs are our priority.  As part of our continuing mission to provide you with exceptional heart care, we have created designated Provider Care Teams.  These Care Teams include your primary Cardiologist (physician) and Advanced Practice Providers (APPs -  Physician Assistants and Nurse Practitioners) who all work together to provide you with the care you need, when you need it.  Your next appointment:   Please follow up as needed with Dr. Sallyanne Kuster    Signed, Sanda Klein, MD  05/25/2019 7:12 PM    [place]

## 2019-05-18 NOTE — Patient Instructions (Signed)
Medication Instructions:  No changes *If you need a refill on your cardiac medications before your next appointment, please call your pharmacy*  Lab Work: None ordered If you have labs (blood work) drawn today and your tests are completely normal, you will receive your results only by: Marland Kitchen MyChart Message (if you have MyChart) OR . A paper copy in the mail If you have any lab test that is abnormal or we need to change your treatment, we will call you to review the results.  Testing/Procedures: None ordered  Follow-Up: At [facility], you and your health needs are our priority.  As part of our continuing mission to provide you with exceptional heart care, we have created designated Provider Care Teams.  These Care Teams include your primary Cardiologist (physician) and Advanced Practice Providers (APPs -  Physician Assistants and Nurse Practitioners) who all work together to provide you with the care you need, when you need it.  Your next appointment:   Please follow up as needed with Dr. Sallyanne Kuster

## 2019-05-19 ENCOUNTER — Telehealth: Payer: Self-pay | Admitting: *Deleted

## 2019-05-19 NOTE — Telephone Encounter (Signed)
Informed patient of sleep study results and patient understanding was verbalized. Patient understands her sleep study showed they had a successful PAP titration and let DME know that orders are in EPIC. Please set up 10 week OV with me.  Pt is aware and agreeable to her results.  Upon patient request DME selection is [facility] Patient understands she will be contacted by [facility] to set up her cpap. Patient understands to call if [facility] does not contact her with new setup in a timely manner. Patient understands they will be called once confirmation has been received from [employer] that they have received their new machine to schedule 10 week follow up appointment.  [facility] notified of new cpap order  Please add to airview Patient was grateful for the call and thanked me.

## 2019-05-19 NOTE — Procedures (Signed)
    Patient Name: [name], [name] Date: 05/18/2019 Gender: Female D.O.B: 1956/02/04 Age (years): 45 Referring Provider: Fransico Him MD, ABSM Height (inches): 63 Interpreting Physician: Fransico Him MD, ABSM Weight (lbs): 276 RPSGT: Carolin Coy BMI: 84 MRN: [id number] Neck Size: 15.00  CLINICAL INFORMATION The patient is referred for a CPAP titration to treat sleep apnea.  SLEEP STUDY TECHNIQUE As per the AASM Manual for the Scoring of Sleep and Associated Events v2.3 (April 2016) with a hypopnea requiring 4% desaturations.  The channels recorded and monitored were frontal, central and occipital EEG, electrooculogram (EOG), submentalis EMG (chin), nasal and oral airflow, thoracic and abdominal wall motion, anterior tibialis EMG, snore microphone, electrocardiogram, and pulse oximetry. Continuous positive airway pressure (CPAP) was initiated at the beginning of the study and titrated to treat sleep-disordered breathing.  MEDICATIONS Medications self-administered by patient taken the night of the study : [place] Comments added by technician: PATIENT WAS ORDERED AS A CPAP TITRATION Comments added by scorer: N/A  RESPIRATORY PARAMETERS Optimal PAP Pressure (cm): 18  AHI at Optimal Pressure (/hr):0.0 Overall Minimal O2 (%):85.0  Supine % at Optimal Pressure (%):100 Minimal O2 at Optimal Pressure (%): 95.0   SLEEP ARCHITECTURE The study was initiated at 9:48:24 PM and ended at 4:47:46 AM.  Sleep onset time was 88.7 minutes and the sleep efficiency was 66.6%. The total sleep time was 279.5 minutes.  The patient spent 10.6% of the night in stage N1 sleep, 74.4% in stage N2 sleep, 2.5% in stage N3 and 12.5% in REM.Stage REM latency was 123.0 minutes  Wake after sleep onset was 51.1. Alpha intrusion was absent. Supine sleep was 100.00%.  CARDIAC DATA The 2 lead EKG demonstrated atrial fibrillation. The mean heart rate was 63.8 beats per minute.  Other EKG findings include: PVCs vs. Aberration.  LEG MOVEMENT DATA The total Periodic Limb Movements of Sleep (PLMS) were 0. The PLMS index was 0.0. A PLMS index of <15 is considered normal in adults.  IMPRESSIONS - The optimal PAP pressure was 18 cm of water. - Central sleep apnea was not noted during this titration (CAI = 0.6/h). - Moderate oxygen desaturations were observed during this titration (min O2 = 85.0%). - The patient snored with moderate snoring volume during this titration study. - 2-lead EKG demonstrated: PVCs and atrial fibrillation - Clinically significant periodic limb movements were not noted during this study. Arousals associated with PLMs were rare.  DIAGNOSIS - Obstructive Sleep Apnea (327.23 [G47.33 ICD-10])  RECOMMENDATIONS - Trial of CPAP therapy on 18 cm H2O with a X-Small size Resmed Full Face Mask AirFit F10 for Her mask and heated humidification. - Avoid alcohol, sedatives and other CNS depressants that may worsen sleep apnea and disrupt normal sleep architecture. - Sleep hygiene should be reviewed to assess factors that may improve sleep quality. - Weight management and regular exercise should be initiated or continued. - Return to [facility] Center for re-evaluation after 10 weeks of therapy  [Electronically signed] 05/19/2019 10:20 AM  Fransico Him MD, ABSM Diplomate, American Board of Sleep Medicine

## 2019-05-19 NOTE — Telephone Encounter (Signed)
-----   Message from Sueanne Margarita, MD sent at 05/19/2019 10:23 AM EST ----- Please let patient know that they had a successful PAP titration and let DME know that orders are in EPIC.  Please set up 10 week OV with me.

## 2019-05-25 ENCOUNTER — Encounter: Payer: Self-pay | Admitting: Cardiovascular Disease

## 2019-06-01 ENCOUNTER — Ambulatory Visit (INDEPENDENT_AMBULATORY_CARE_PROVIDER_SITE_OTHER): Payer: Medicare HMO | Admitting: *Deleted

## 2019-06-01 DIAGNOSIS — R001 Bradycardia, unspecified: Secondary | ICD-10-CM

## 2019-06-01 LAB — CUP PACEART REMOTE DEVICE CHECK
Battery Remaining Longevity: 164 mo
Battery Voltage: 3.16 V
Brady Statistic RV Percent Paced: 8.15 %
Date Time Interrogation Session: 20210224015223
Implantable Lead Implant Date: 20200526
Implantable Lead Location: 753860
Implantable Lead Model: 5076
Implantable Pulse Generator Implant Date: 20200526
Lead Channel Impedance Value: 380 Ohm
Lead Channel Impedance Value: 475 Ohm
Lead Channel Pacing Threshold Amplitude: 0.5 V
Lead Channel Pacing Threshold Pulse Width: 0.4 ms
Lead Channel Sensing Intrinsic Amplitude: 16.625 mV
Lead Channel Sensing Intrinsic Amplitude: 16.625 mV
Lead Channel Setting Pacing Amplitude: 2.5 V
Lead Channel Setting Pacing Pulse Width: 0.4 ms
Lead Channel Setting Sensing Sensitivity: 2.8 mV

## 2019-06-02 NOTE — Progress Notes (Signed)
PPM Remote  

## 2019-06-09 ENCOUNTER — Ambulatory Visit: Payer: Medicare HMO

## 2019-06-30 ENCOUNTER — Ambulatory Visit: Payer: Medicare HMO | Attending: Internal Medicine

## 2019-06-30 DIAGNOSIS — Z23 Encounter for immunization: Secondary | ICD-10-CM

## 2019-06-30 NOTE — Progress Notes (Signed)
   Covid-19 Vaccination Clinic  Name:  [name]    MRN: [id number] DOB: [birth year]  06/30/2019  [name] was observed post Covid-19 immunization for 15 minutes without incident. She was provided with Vaccine Information Sheet and instruction to access the V-Safe system.   [name] was instructed to call 911 with any severe reactions post vaccine: Marland Kitchen Difficulty breathing  . Swelling of face and throat  . A fast heartbeat  . A bad rash all over body  . Dizziness and weakness   Immunizations Administered    Name Date Dose VIS Date Route   Pfizer COVID-19 Vaccine 06/30/2019  1:38 PM 0.3 mL 03/18/2019 Intramuscular   Manufacturer: [place]   Lot: [id number]   [place]: [id number]

## 2019-07-07 ENCOUNTER — Ambulatory Visit: Payer: Medicare HMO

## 2019-07-25 ENCOUNTER — Ambulatory Visit: Payer: Medicare HMO | Attending: Internal Medicine

## 2019-07-25 DIAGNOSIS — Z23 Encounter for immunization: Secondary | ICD-10-CM

## 2019-07-25 NOTE — Progress Notes (Signed)
   Covid-19 Vaccination Clinic  Name:  [name]    MRN: [id number] DOB: [birth year]  07/25/2019  [name] was observed post Covid-19 immunization for 15 minutes without incident. She was provided with Vaccine Information Sheet and instruction to access the V-Safe system.   [name] was instructed to call 911 with any severe reactions post vaccine: Marland Kitchen Difficulty breathing  . Swelling of face and throat  . A fast heartbeat  . A bad rash all over body  . Dizziness and weakness   Immunizations Administered    Name Date Dose VIS Date Route   Pfizer COVID-19 Vaccine 07/25/2019  1:39 PM 0.3 mL 06/01/2018 Intramuscular   Manufacturer: [place]   Lot: [id number]   [place]: [id number]

## 2019-08-02 ENCOUNTER — Inpatient Hospital Stay (HOSPITAL_COMMUNITY): Admission: RE | Admit: 2019-08-02 | Payer: Medicare HMO | Source: Ambulatory Visit | Admitting: Internal Medicine

## 2019-08-31 ENCOUNTER — Ambulatory Visit (INDEPENDENT_AMBULATORY_CARE_PROVIDER_SITE_OTHER): Payer: Medicare HMO | Admitting: *Deleted

## 2019-08-31 DIAGNOSIS — R001 Bradycardia, unspecified: Secondary | ICD-10-CM | POA: Diagnosis not present

## 2019-08-31 LAB — CUP PACEART REMOTE DEVICE CHECK
Battery Remaining Longevity: 166 mo
Battery Voltage: 3.13 V
Brady Statistic RV Percent Paced: 13.34 %
Date Time Interrogation Session: 20210526021727
Implantable Lead Implant Date: 20200526
Implantable Lead Location: 753860
Implantable Lead Model: 5076
Implantable Pulse Generator Implant Date: 20200526
Lead Channel Impedance Value: 380 Ohm
Lead Channel Impedance Value: 494 Ohm
Lead Channel Pacing Threshold Amplitude: 0.625 V
Lead Channel Pacing Threshold Pulse Width: 0.4 ms
Lead Channel Sensing Intrinsic Amplitude: 13.75 mV
Lead Channel Sensing Intrinsic Amplitude: 13.75 mV
Lead Channel Setting Pacing Amplitude: 2.5 V
Lead Channel Setting Pacing Pulse Width: 0.4 ms
Lead Channel Setting Sensing Sensitivity: 2.8 mV

## 2019-09-01 NOTE — Progress Notes (Signed)
Remote pacemaker transmission.   

## 2019-09-23 ENCOUNTER — Other Ambulatory Visit: Payer: Self-pay | Admitting: Internal Medicine

## 2019-09-23 DIAGNOSIS — R0989 Other specified symptoms and signs involving the circulatory and respiratory systems: Secondary | ICD-10-CM

## 2019-10-05 ENCOUNTER — Other Ambulatory Visit: Payer: Self-pay

## 2019-10-05 ENCOUNTER — Encounter (HOSPITAL_COMMUNITY): Payer: Self-pay | Admitting: Internal Medicine

## 2019-10-05 ENCOUNTER — Ambulatory Visit (HOSPITAL_COMMUNITY)
Admission: RE | Admit: 2019-10-05 | Discharge: 2019-10-05 | Disposition: A | Discharge status: Home / Self Care | Payer: Medicare HMO | Source: Ambulatory Visit | Attending: Internal Medicine | Admitting: Internal Medicine

## 2019-10-05 VITALS — BP 136/80 | HR 63 | Ht 63.0 in | Wt 281.0 lb

## 2019-10-05 DIAGNOSIS — Z6841 Body Mass Index (BMI) 40.0 and over, adult: Secondary | ICD-10-CM | POA: Diagnosis not present

## 2019-10-05 DIAGNOSIS — F329 Major depressive disorder, single episode, unspecified: Secondary | ICD-10-CM | POA: Diagnosis not present

## 2019-10-05 DIAGNOSIS — Z87891 Personal history of nicotine dependence: Secondary | ICD-10-CM | POA: Diagnosis not present

## 2019-10-05 DIAGNOSIS — Z794 Long term (current) use of insulin: Secondary | ICD-10-CM | POA: Insufficient documentation

## 2019-10-05 DIAGNOSIS — K219 Gastro-esophageal reflux disease without esophagitis: Secondary | ICD-10-CM | POA: Insufficient documentation

## 2019-10-05 DIAGNOSIS — Z950 Presence of cardiac pacemaker: Secondary | ICD-10-CM | POA: Insufficient documentation

## 2019-10-05 DIAGNOSIS — Z86718 Personal history of other venous thrombosis and embolism: Secondary | ICD-10-CM | POA: Insufficient documentation

## 2019-10-05 DIAGNOSIS — E1122 Type 2 diabetes mellitus with diabetic chronic kidney disease: Secondary | ICD-10-CM | POA: Insufficient documentation

## 2019-10-05 DIAGNOSIS — I130 Hypertensive heart and chronic kidney disease with heart failure and stage 1 through stage 4 chronic kidney disease, or unspecified chronic kidney disease: Secondary | ICD-10-CM | POA: Insufficient documentation

## 2019-10-05 DIAGNOSIS — E669 Obesity, unspecified: Secondary | ICD-10-CM | POA: Insufficient documentation

## 2019-10-05 DIAGNOSIS — Z8781 Personal history of (healed) traumatic fracture: Secondary | ICD-10-CM | POA: Insufficient documentation

## 2019-10-05 DIAGNOSIS — Z8249 Family history of ischemic heart disease and other diseases of the circulatory system: Secondary | ICD-10-CM | POA: Insufficient documentation

## 2019-10-05 DIAGNOSIS — M1990 Unspecified osteoarthritis, unspecified site: Secondary | ICD-10-CM | POA: Diagnosis not present

## 2019-10-05 DIAGNOSIS — Z7901 Long term (current) use of anticoagulants: Secondary | ICD-10-CM | POA: Diagnosis not present

## 2019-10-05 DIAGNOSIS — I442 Atrioventricular block, complete: Secondary | ICD-10-CM | POA: Diagnosis not present

## 2019-10-05 DIAGNOSIS — G4733 Obstructive sleep apnea (adult) (pediatric): Secondary | ICD-10-CM | POA: Diagnosis not present

## 2019-10-05 DIAGNOSIS — M109 Gout, unspecified: Secondary | ICD-10-CM | POA: Diagnosis not present

## 2019-10-05 DIAGNOSIS — E7800 Pure hypercholesterolemia, unspecified: Secondary | ICD-10-CM | POA: Diagnosis not present

## 2019-10-05 DIAGNOSIS — E039 Hypothyroidism, unspecified: Secondary | ICD-10-CM | POA: Insufficient documentation

## 2019-10-05 DIAGNOSIS — N183 Chronic kidney disease, stage 3 unspecified: Secondary | ICD-10-CM

## 2019-10-05 DIAGNOSIS — N1832 Chronic kidney disease, stage 3b: Secondary | ICD-10-CM | POA: Diagnosis not present

## 2019-10-05 DIAGNOSIS — Z86711 Personal history of pulmonary embolism: Secondary | ICD-10-CM | POA: Insufficient documentation

## 2019-10-05 DIAGNOSIS — Z885 Allergy status to narcotic agent status: Secondary | ICD-10-CM | POA: Insufficient documentation

## 2019-10-05 DIAGNOSIS — I4891 Unspecified atrial fibrillation: Secondary | ICD-10-CM | POA: Insufficient documentation

## 2019-10-05 DIAGNOSIS — I5032 Chronic diastolic (congestive) heart failure: Secondary | ICD-10-CM | POA: Diagnosis not present

## 2019-10-05 DIAGNOSIS — Z87440 Personal history of urinary (tract) infections: Secondary | ICD-10-CM | POA: Insufficient documentation

## 2019-10-05 DIAGNOSIS — Z79899 Other long term (current) drug therapy: Secondary | ICD-10-CM | POA: Diagnosis not present

## 2019-10-05 LAB — BASIC METABOLIC PANEL
Anion gap: 10 (ref 5–15)
BUN: 36 mg/dL — ABNORMAL HIGH (ref 8–23)
CO2: 27 mmol/L (ref 22–32)
Calcium: 9 mg/dL (ref 8.9–10.3)
Chloride: 103 mmol/L (ref 98–111)
Creatinine, Ser: 1.55 mg/dL — ABNORMAL HIGH (ref 0.44–1.00)
GFR calc Af Amer: 41 mL/min — ABNORMAL LOW (ref >60)
GFR calc non Af Amer: 35 mL/min — ABNORMAL LOW (ref >60)
Glucose, Bld: 117 mg/dL — ABNORMAL HIGH (ref 70–99)
Potassium: 4.2 mmol/L (ref 3.5–5.1)
Sodium: 140 mmol/L (ref 135–145)

## 2019-10-05 LAB — BRAIN NATRIURETIC PEPTIDE: B Natriuretic Peptide: 267.1 pg/mL — ABNORMAL HIGH (ref 0.0–100.0)

## 2019-10-05 MED ORDER — TORSEMIDE 20 MG PO TABS: ORAL_TABLET | ORAL | 3 refills | Status: DC

## 2019-10-05 NOTE — Progress Notes (Signed)
Advanced Heart Failure Clinic Note    PCP: [name], MD PCP-HF Cardiologist: Dr Haroldine Laws   HPI: [name] is a 64 y/o female w/ h/o obesity, HTN, DM, atrial fib, DVT, PE x3 , s/p IVC filter placed in 2013, CHB s/p MDT single chamber PPM 08/2018, and chronic diastolic heart failure. In 2013, she had sleep study with mild sleep apnea noted.   R/LHC in 2016 showed no significant coronary disease, cardiac output 8.9 and PA pressured 41/13.    In January 2020 she had presyncopal episode and had some intermittent dizziness.  She told her PCP and was instructed to keep a log. In May she had presyncope and a syncopal episode. She had a fall off her porch. This was determined to be in the setting of CHB. MDT single chamber PPM placed. She also had minimally displaced C6 fracture.   She [facility]  w/ dyspnea and cough. Initial w/u in the ED c/w acute on chronic diastolic HF. Admitted and started on IV diuretics. 2D echo showed EF 65-70% w/ pseudonormalization of LV consistent with diastolic dysfunction. She was diuresed w/ Lasix. RHC completed with preserved cardiac output and low filling pressures. PYP scan not convincing for amyloid. Outpatient sleep study recommended. EP saw 9/22 reprogrammed to VVI 50 due to asynchronous V pacing  She had repeat sleep study 02/25/19 and found to have moderate OSA. No device yet. This is being followed by Dr. Radford Pax. She has f/u visit on 04/26/19.   Today she returns for HF follow up. Remains on torsemide 60/40. She is quite sad today as her husband passed away last month with acute leukemia. Says she hasn't been watching her salt. Weight up 5 pounds. Gets around with walker. Chronic DOE. No orthopnea or PND. Wraps her own legs with UNNA boots. Wearing CPAP but hates it. Very active   Pacer interrogation: Optivol looks good. Activity level 4-5 hr/day Personally reviewed   Echo 12/27/18  1. Left ventricular ejection fraction, by  visual estimation, is 65 to 70%. The left ventricle has normal function. Normal left ventricular size. There is moderately increased left ventricular hypertrophy. 2. Left ventricular diastolic Doppler parameters are consistent with pseudonormalization pattern of LV diastolic filling. 3. Global right ventricle has normal systolic function.The right ventricular size is normal. No increase in right ventricular wall thickness. 4. Left atrial size was mildly dilated. 5. Right atrial size was normal. 6. Mild to moderate aortic valve annular calcification. 7. Moderate to severe mitral annular calcification. 8. The mitral valve is normal in structure. No evidence of mitral valve regurgitation. No evidence of mitral stenosis. 9. The tricuspid valve is normal in structure. Tricuspid valve regurgitation was not visualized by color flow Doppler. 10. The aortic valve is normal in structure. Aortic valve regurgitation was not visualized by color flow Doppler. Mild to moderate aortic valve sclerosis/calcification without any evidence of aortic stenosis. 11. The pulmonic valve was normal in structure. Pulmonic valve regurgitation is not visualized by color flow Doppler. 12. Normal pulmonary artery systolic pressure. 13. A pacer wire is visualized. 14. The inferior vena cava is normal in size with greater than 50% respiratory variability, suggesting right atrial pressure of 3 mmHg.   Past Medical History:  Diagnosis Date  . A-fib ([place])   . Anemia   . Anxiety   . Arthritis    "right hip; both knees; left wrist/shoulder; back" (01/19/2013"  . Bleeding on Coumadin 08/2012; 01/18/2013   [facility] admissions (01/19/2013)  .  CHF (congestive heart failure) ([place])    "2-3 times" (01/19/2013)  . Chronic lower back pain   . Depression   . DVT (deep venous thrombosis) ([place]) 10 years ago   numerous/notes 01/18/2013  . GERD (gastroesophageal reflux disease)   . Gout   . OINOMVEH(209.4)    "maybe weekly"  (01/19/2013)  . Heart murmur   . High cholesterol    "been off RX for this at one time" (01/18/2013)  . History of blood transfusion 1983; 04/2012   "3 w/ childbirth; hospitalized for pain" (01/19/2013)  . Hypertension   . Hypothyroidism   . Migraines    "twice/yr maybe" (01/19/2013)  . OSA (obstructive sleep apnea)    "sent me for test in 04/2012; never ordered mask, etc" (01/19/2013)  . PE (pulmonary thromboembolism) ([place]) 3 years ag0   3/notes 01/18/2013  . Pneumonia before 2011   "once' (01/18/2013)  . Renal disorder    kindey function low; "Metformin was destroying my kidneys" (01/19/2013)  . Shortness of breath    "only related to my CHF" (01/18/2013)  . Swelling of hand 08/31/2014   RT HAND  . Type II diabetes mellitus ([place])   . UTI (urinary tract infection) 08/31/2014    Current Outpatient Medications  Medication Sig Dispense Refill  . acetaminophen (TYLENOL) 500 MG tablet Take 500 mg by mouth every 6 (six) hours as needed for fever or headache (pain).    Marland Kitchen allopurinol (ZYLOPRIM) 300 MG tablet Take 300 mg by mouth daily.    . Biotin 1000 MCG tablet Take 500 mcg by mouth daily with lunch.     . Calcium Citrate-Vitamin D (CITRACAL/VITAMIN D PO) Take 2 tablets by mouth every evening.    . citalopram (CELEXA) 20 MG tablet Take 20 mg by mouth at bedtime.    . ferrous sulfate 325 (65 FE) MG tablet Take 325 mg by mouth daily.    . insulin aspart (NOVOLOG) 100 UNIT/ML injection Inject 2 Units into the skin 3 (three) times daily with meals. 10 mL 11  . Insulin Detemir (LEVEMIR FLEXTOUCH) 100 UNIT/ML Pen Inject 20 Units into the skin daily before breakfast.     . isosorbide mononitrate (IMDUR) 60 MG 24 hr tablet Take 1 tablet (60 mg total) by mouth daily. 90 tablet 0  . levothyroxine (SYNTHROID) 75 MCG tablet Take 75 mcg by mouth daily before breakfast.     . Melatonin 5 MG TABS Take 10 mg by mouth at bedtime.     . Multiple Vitamin (MULITIVITAMIN WITH MINERALS) TABS Take 1 tablet by  mouth daily.    . Omega-3 Fatty Acids (FISH OIL) 1000 MG CAPS Take 1,000 mg by mouth daily.     . potassium chloride SA (KLOR-CON) 20 MEQ tablet Take 3 tablets (60 mEq total) by mouth daily. 270 tablet 3  . rosuvastatin (CRESTOR) 10 MG tablet Take 1 tablet (10 mg total) by mouth daily. 30 tablet 0  . Semaglutide,0.25 or 0.5MG /DOS, (OZEMPIC, 0.25 OR 0.5 MG/DOSE,) 2 MG/1.5ML SOPN Inject 0.5 mg into the skin once a week.    . torsemide (DEMADEX) 20 MG tablet Take 3 tablets (60 mg total) by mouth every morning AND 2 tablets (40 mg total) every evening. 450 tablet 3  . warfarin (COUMADIN) 2.5 MG tablet TAKE 7.5 MG ON M-W-F AND 5 MG TUES, THURS, SAT AND SUN AS DIRECTED (Patient taking differently: Pt taking 5 mg six days per week, and 7 mg on the seventh day.) 120 tablet 1  No current facility-administered medications for this encounter.    Allergies  Allergen Reactions  . Morphine And Related Rash      Social History   Socioeconomic History  . Marital status: Single    Spouse name: Not on file  . Number of children: Not on file  . Years of education: Not on file  . Highest education level: Not on file  Occupational History  . Occupation: disabled  Tobacco Use  . Smoking status: Former Smoker    Packs/day: 0.05    Years: 30.00    Pack years: 1.50    Types: Cigarettes    Quit date: 1990    Years since quitting: 31.5  . Smokeless tobacco: Never Used  . Tobacco comment: 01/19/2013 "quit smoking cigarettes in the early '90's"  Substance and Sexual Activity  . Alcohol use: Not Currently    Comment: rare now but never a heavy drinker  . Drug use: No  . Sexual activity: Never  Other Topics Concern  . Not on file  Social History Narrative  . Not on file   Social Determinants of Health   Financial Resource Strain:   . Difficulty of Paying Living Expenses:   Food Insecurity:   . Worried About Charity fundraiser in the Last Year:   . Arboriculturist in the Last Year:     Transportation Needs:   . Film/video editor (Medical):   Marland Kitchen Lack of Transportation (Non-Medical):   Physical Activity:   . Days of Exercise per Week:   . Minutes of Exercise per Session:   Stress:   . Feeling of Stress :   Social Connections:   . Frequency of Communication with Friends and Family:   . Frequency of Social Gatherings with Friends and Family:   . Attends Religious Services:   . Active Member of Clubs or Organizations:   . Attends Archivist Meetings:   Marland Kitchen Marital Status:   Intimate Partner Violence:   . Fear of Current or Ex-Partner:   . Emotionally Abused:   Marland Kitchen Physically Abused:   . Sexually Abused:       Family History  Problem Relation Age of Onset  . Cerebral aneurysm Mother   . Hypertension Father   . Cerebral aneurysm Maternal Grandfather   . Cerebral aneurysm Maternal Aunt   . Cancer Maternal Uncle     Vitals:   10/05/19 1150  BP: 136/80  Pulse: 63  SpO2: 96%  Weight: 127.5 kg (281 lb)  Height: 5\' 3"  (1.6 m)   Wt Readings from Last 3 Encounters:  10/05/19 127.5 kg (281 lb)  05/18/19 125.2 kg (276 lb)  05/18/19 124.3 kg (274 lb)    PHYSICAL EXAM: General: Sitting in chair No resp difficulty HEENT: normal Neck: supple. JVP 8. Carotids 2+ bilat; no bruits. No lymphadenopathy or thryomegaly appreciated. Cor: PMI nondisplaced. Regular rate & rhythm. No rubs, gallops or murmurs. Lungs: clear Abdomen: soft, nontender, nondistended. No hepatosplenomegaly. No bruits or masses. Good bowel sounds. Extremities: no cyanosis, clubbing, rash, 1+ edema + UNNA boots Neuro: alert & orientedx3, cranial nerves grossly intact. moves all 4 extremities w/o difficulty. Affect pleasant    ASSESSMENT & PLAN: 1. Chronic Diastolic Heart Failure: ECHO 12/2017 EF 65-70% , moderately increased LVH. [place] 12/2018 with preserved cardiac output and low filling pressures. PYP scan not convincing for amyloid.  - Stable NYHA III, chronically but activity level  great on pacer interrogation - Volume status mildly elevated on  exam (ok on pacer interrogation) - Continue torsemide to 60 mg qam/ 40 mg qpm. Can increase to 60 bid as needed - Continue Jardiance  2.CKD3b -Baseline creatinine is ~1.6-1.8  -Check labs today  3. HTN:  - Blood pressure well controlled. Continue current regimen.  4.H/O CHB with MDT PPM-  - Followd EP reprogrammed  to VVI 50   5. DM:  - On insulin.  - Followed by PCP - On Jardiance   6. Hypothyroidism  -On levothyroxine. Followed by PCP  7. H/O PEs: - On coumadin. - INRs followed by PCP   8. Obesity Body mass index is 49.78 kg/m. - Discussed portion control.   9. OSA: new diagnosis. Found to have moderate OSA. On CPAP followed by Dr. Arn Medal, MD 10/05/19

## 2019-10-05 NOTE — Patient Instructions (Signed)
Labs done today. We will contact you only if your labs are abnormal.  INCREASE Torsemide take 60mg (3 tablets) by mouth 2 times daily AS NEEDED ONLY.  No other medication changes were made. Please continue all other medications as prescribed.  Your physician recommends that you schedule a follow-up appointment in: 1 year. Our office will contact you at a later date to schedule an appointment.   If you have any questions or concerns before your next appointment please send [place] a message through [place] or call our office at [phone].    TO LEAVE A MESSAGE FOR THE NURSE SELECT OPTION 2, PLEASE LEAVE A MESSAGE INCLUDING: . YOUR NAME . DATE OF BIRTH . CALL BACK NUMBER . REASON FOR CALL**this is important as we prioritize the call backs  YOU WILL RECEIVE A CALL BACK THE SAME DAY AS LONG AS YOU CALL BEFORE 4:00 PM   Do the following things EVERYDAY: 1) Weigh yourself in the morning before breakfast. Write it down and keep it in a log. 2) Take your medicines as prescribed 3) Eat low salt foods--Limit salt (sodium) to 2000 mg per day.  4) Stay as active as you can everyday 5) Limit all fluids for the day to less than 2 liters   At the [facility], you and your health needs are our priority. As part of our continuing mission to provide you with exceptional heart care, we have created designated Provider Care Teams. These Care Teams include your primary Cardiologist (physician) and Advanced Practice Providers (APPs- Physician Assistants and Nurse Practitioners) who all work together to provide you with the care you need, when you need it.   You may see any of the following providers on your designated Care Team at your next follow up: Marland Kitchen Dr Glori Bickers . Dr Loralie Champagne . Darrick Grinder, NP . Lyda Jester, PA . Audry Riles, PharmD   Please be sure to bring in all your medications bottles to every appointment.

## 2019-11-30 ENCOUNTER — Ambulatory Visit (INDEPENDENT_AMBULATORY_CARE_PROVIDER_SITE_OTHER): Payer: Medicare HMO | Admitting: *Deleted

## 2019-11-30 DIAGNOSIS — R001 Bradycardia, unspecified: Secondary | ICD-10-CM | POA: Diagnosis not present

## 2019-12-01 LAB — CUP PACEART REMOTE DEVICE CHECK
Battery Remaining Longevity: 163 mo
Battery Voltage: 3.09 V
Brady Statistic RV Percent Paced: 14.07 %
Date Time Interrogation Session: 20210825050319
Implantable Lead Implant Date: 20200526
Implantable Lead Location: 753860
Implantable Lead Model: 5076
Implantable Pulse Generator Implant Date: 20200526
Lead Channel Impedance Value: 380 Ohm
Lead Channel Impedance Value: 456 Ohm
Lead Channel Pacing Threshold Amplitude: 0.5 V
Lead Channel Pacing Threshold Pulse Width: 0.4 ms
Lead Channel Sensing Intrinsic Amplitude: 16 mV
Lead Channel Sensing Intrinsic Amplitude: 16 mV
Lead Channel Setting Pacing Amplitude: 2.5 V
Lead Channel Setting Pacing Pulse Width: 0.4 ms
Lead Channel Setting Sensing Sensitivity: 2.8 mV

## 2019-12-06 ENCOUNTER — Other Ambulatory Visit: Payer: Self-pay | Admitting: Internal Medicine

## 2019-12-06 DIAGNOSIS — Z1231 Encounter for screening mammogram for malignant neoplasm of breast: Secondary | ICD-10-CM

## 2019-12-06 NOTE — Progress Notes (Signed)
Remote pacemaker transmission.   

## 2019-12-08 ENCOUNTER — Other Ambulatory Visit: Payer: Medicare HMO

## 2019-12-16 ENCOUNTER — Other Ambulatory Visit: Payer: Medicare HMO

## 2020-02-29 ENCOUNTER — Ambulatory Visit (INDEPENDENT_AMBULATORY_CARE_PROVIDER_SITE_OTHER): Payer: Medicare HMO

## 2020-02-29 DIAGNOSIS — R001 Bradycardia, unspecified: Secondary | ICD-10-CM

## 2020-02-29 LAB — CUP PACEART REMOTE DEVICE CHECK
Battery Remaining Longevity: 161 mo
Battery Voltage: 3.07 V
Brady Statistic RV Percent Paced: 15.66 %
Date Time Interrogation Session: 20211123230228
Implantable Lead Implant Date: 20200526
Implantable Lead Location: 753860
Implantable Lead Model: 5076
Implantable Pulse Generator Implant Date: 20200526
Lead Channel Impedance Value: 380 Ohm
Lead Channel Impedance Value: 437 Ohm
Lead Channel Pacing Threshold Amplitude: 0.5 V
Lead Channel Pacing Threshold Pulse Width: 0.4 ms
Lead Channel Sensing Intrinsic Amplitude: 14.25 mV
Lead Channel Sensing Intrinsic Amplitude: 14.25 mV
Lead Channel Setting Pacing Amplitude: 2.5 V
Lead Channel Setting Pacing Pulse Width: 0.4 ms
Lead Channel Setting Sensing Sensitivity: 2.8 mV

## 2020-03-05 ENCOUNTER — Other Ambulatory Visit (HOSPITAL_COMMUNITY): Payer: Self-pay | Admitting: Adult Health

## 2020-03-08 NOTE — Progress Notes (Signed)
Remote pacemaker transmission.   

## 2020-05-30 ENCOUNTER — Ambulatory Visit (INDEPENDENT_AMBULATORY_CARE_PROVIDER_SITE_OTHER): Payer: Self-pay

## 2020-05-30 DIAGNOSIS — R001 Bradycardia, unspecified: Secondary | ICD-10-CM

## 2020-05-31 LAB — CUP PACEART REMOTE DEVICE CHECK
Battery Remaining Longevity: 158 mo
Battery Voltage: 3.05 V
Brady Statistic RV Percent Paced: 33.87 %
Date Time Interrogation Session: 20220222201143
Implantable Lead Implant Date: 20200526
Implantable Lead Location: 753860
Implantable Lead Model: 5076
Implantable Pulse Generator Implant Date: 20200526
Lead Channel Impedance Value: 342 Ohm
Lead Channel Impedance Value: 399 Ohm
Lead Channel Pacing Threshold Amplitude: 0.5 V
Lead Channel Pacing Threshold Pulse Width: 0.4 ms
Lead Channel Sensing Intrinsic Amplitude: 14.625 mV
Lead Channel Sensing Intrinsic Amplitude: 14.625 mV
Lead Channel Setting Pacing Amplitude: 2.5 V
Lead Channel Setting Pacing Pulse Width: 0.4 ms
Lead Channel Setting Sensing Sensitivity: 2.8 mV

## 2020-06-08 NOTE — Progress Notes (Signed)
Remote pacemaker transmission.   

## 2020-08-22 ENCOUNTER — Other Ambulatory Visit: Payer: Self-pay | Admitting: Internal Medicine

## 2020-08-22 DIAGNOSIS — Z1231 Encounter for screening mammogram for malignant neoplasm of breast: Secondary | ICD-10-CM

## 2020-08-27 ENCOUNTER — Ambulatory Visit
Admission: RE | Admit: 2020-08-27 | Discharge: 2020-08-27 | Disposition: A | Discharge status: Home / Self Care | Payer: Medicare HMO | Source: Ambulatory Visit

## 2020-08-27 ENCOUNTER — Other Ambulatory Visit: Payer: Self-pay

## 2020-08-27 DIAGNOSIS — Z1231 Encounter for screening mammogram for malignant neoplasm of breast: Secondary | ICD-10-CM

## 2020-08-29 ENCOUNTER — Ambulatory Visit (INDEPENDENT_AMBULATORY_CARE_PROVIDER_SITE_OTHER): Payer: Medicare HMO

## 2020-08-29 DIAGNOSIS — R001 Bradycardia, unspecified: Secondary | ICD-10-CM | POA: Diagnosis not present

## 2020-08-30 LAB — CUP PACEART REMOTE DEVICE CHECK
Battery Remaining Longevity: 156 mo
Battery Voltage: 3.04 V
Brady Statistic RV Percent Paced: 16.26 %
Date Time Interrogation Session: 20220525000201
Implantable Lead Implant Date: 20200526
Implantable Lead Location: 753860
Implantable Lead Model: 5076
Implantable Pulse Generator Implant Date: 20200526
Lead Channel Impedance Value: 342 Ohm
Lead Channel Impedance Value: 399 Ohm
Lead Channel Pacing Threshold Amplitude: 0.5 V
Lead Channel Pacing Threshold Pulse Width: 0.4 ms
Lead Channel Sensing Intrinsic Amplitude: 13.5 mV
Lead Channel Sensing Intrinsic Amplitude: 13.5 mV
Lead Channel Setting Pacing Amplitude: 2.5 V
Lead Channel Setting Pacing Pulse Width: 0.4 ms
Lead Channel Setting Sensing Sensitivity: 2.8 mV

## 2020-09-21 NOTE — Progress Notes (Signed)
Remote pacemaker transmission.   

## 2020-11-14 ENCOUNTER — Other Ambulatory Visit (HOSPITAL_COMMUNITY): Payer: Self-pay | Admitting: Internal Medicine

## 2020-11-28 ENCOUNTER — Ambulatory Visit (INDEPENDENT_AMBULATORY_CARE_PROVIDER_SITE_OTHER): Payer: Medicare HMO

## 2020-11-28 DIAGNOSIS — I441 Atrioventricular block, second degree: Secondary | ICD-10-CM

## 2020-11-28 LAB — CUP PACEART REMOTE DEVICE CHECK
Battery Remaining Longevity: 154 mo
Battery Voltage: 3.04 V
Brady Statistic RV Percent Paced: 15.75 %
Date Time Interrogation Session: 20220824000642
Implantable Lead Implant Date: 20200526
Implantable Lead Location: 753860
Implantable Lead Model: 5076
Implantable Pulse Generator Implant Date: 20200526
Lead Channel Impedance Value: 361 Ohm
Lead Channel Impedance Value: 418 Ohm
Lead Channel Pacing Threshold Amplitude: 0.5 V
Lead Channel Pacing Threshold Pulse Width: 0.4 ms
Lead Channel Sensing Intrinsic Amplitude: 13.875 mV
Lead Channel Sensing Intrinsic Amplitude: 13.875 mV
Lead Channel Setting Pacing Amplitude: 2.5 V
Lead Channel Setting Pacing Pulse Width: 0.4 ms
Lead Channel Setting Sensing Sensitivity: 2.8 mV

## 2020-12-13 NOTE — Progress Notes (Signed)
Remote pacemaker transmission.   

## 2021-02-27 ENCOUNTER — Ambulatory Visit (INDEPENDENT_AMBULATORY_CARE_PROVIDER_SITE_OTHER): Payer: Medicare HMO

## 2021-02-27 DIAGNOSIS — I5032 Chronic diastolic (congestive) heart failure: Secondary | ICD-10-CM

## 2021-03-03 LAB — CUP PACEART REMOTE DEVICE CHECK
Battery Remaining Longevity: 151 mo
Battery Voltage: 3.04 V
Brady Statistic RV Percent Paced: 15.84 %
Date Time Interrogation Session: 20221123131511
Implantable Lead Implant Date: 20200526
Implantable Lead Location: 753860
Implantable Lead Model: 5076
Implantable Pulse Generator Implant Date: 20200526
Lead Channel Impedance Value: 342 Ohm
Lead Channel Impedance Value: 399 Ohm
Lead Channel Pacing Threshold Amplitude: 0.5 V
Lead Channel Pacing Threshold Pulse Width: 0.4 ms
Lead Channel Sensing Intrinsic Amplitude: 11.625 mV
Lead Channel Sensing Intrinsic Amplitude: 11.625 mV
Lead Channel Setting Pacing Amplitude: 2.5 V
Lead Channel Setting Pacing Pulse Width: 0.4 ms
Lead Channel Setting Sensing Sensitivity: 2.8 mV

## 2021-03-11 NOTE — Progress Notes (Signed)
Remote pacemaker transmission.   

## 2021-03-21 ENCOUNTER — Other Ambulatory Visit (HOSPITAL_COMMUNITY): Payer: Self-pay | Admitting: Internal Medicine

## 2021-04-30 ENCOUNTER — Other Ambulatory Visit (HOSPITAL_COMMUNITY): Payer: Self-pay | Admitting: Internal Medicine

## 2021-05-29 ENCOUNTER — Ambulatory Visit (INDEPENDENT_AMBULATORY_CARE_PROVIDER_SITE_OTHER): Payer: Medicare HMO

## 2021-05-29 DIAGNOSIS — I441 Atrioventricular block, second degree: Secondary | ICD-10-CM | POA: Diagnosis not present

## 2021-05-29 LAB — CUP PACEART REMOTE DEVICE CHECK
Battery Remaining Longevity: 149 mo
Battery Voltage: 3.03 V
Brady Statistic RV Percent Paced: 21.67 %
Date Time Interrogation Session: 20230222004610
Implantable Lead Implant Date: 20200526
Implantable Lead Location: 753860
Implantable Lead Model: 5076
Implantable Pulse Generator Implant Date: 20200526
Lead Channel Impedance Value: 342 Ohm
Lead Channel Impedance Value: 418 Ohm
Lead Channel Pacing Threshold Amplitude: 0.5 V
Lead Channel Pacing Threshold Pulse Width: 0.4 ms
Lead Channel Sensing Intrinsic Amplitude: 12.5 mV
Lead Channel Sensing Intrinsic Amplitude: 12.5 mV
Lead Channel Setting Pacing Amplitude: 2.5 V
Lead Channel Setting Pacing Pulse Width: 0.4 ms
Lead Channel Setting Sensing Sensitivity: 2.8 mV

## 2021-06-05 NOTE — Progress Notes (Signed)
Remote pacemaker transmission.   

## 2021-07-03 ENCOUNTER — Other Ambulatory Visit (HOSPITAL_COMMUNITY): Payer: Self-pay | Admitting: Internal Medicine

## 2021-08-28 ENCOUNTER — Ambulatory Visit (INDEPENDENT_AMBULATORY_CARE_PROVIDER_SITE_OTHER): Payer: Medicare HMO

## 2021-08-28 DIAGNOSIS — I480 Paroxysmal atrial fibrillation: Secondary | ICD-10-CM | POA: Diagnosis not present

## 2021-08-28 LAB — CUP PACEART REMOTE DEVICE CHECK
Battery Remaining Longevity: 146 mo
Battery Voltage: 3.03 V
Brady Statistic RV Percent Paced: 21.18 %
Date Time Interrogation Session: 20230524044749
Implantable Lead Implant Date: 20200526
Implantable Lead Location: 753860
Implantable Lead Model: 5076
Implantable Pulse Generator Implant Date: 20200526
Lead Channel Impedance Value: 323 Ohm
Lead Channel Impedance Value: 399 Ohm
Lead Channel Pacing Threshold Amplitude: 0.625 V
Lead Channel Pacing Threshold Pulse Width: 0.4 ms
Lead Channel Sensing Intrinsic Amplitude: 13.25 mV
Lead Channel Sensing Intrinsic Amplitude: 13.25 mV
Lead Channel Setting Pacing Amplitude: 2.5 V
Lead Channel Setting Pacing Pulse Width: 0.4 ms
Lead Channel Setting Sensing Sensitivity: 2.8 mV

## 2021-09-08 ENCOUNTER — Encounter (HOSPITAL_COMMUNITY): Payer: Self-pay

## 2021-09-08 ENCOUNTER — Emergency Department (HOSPITAL_COMMUNITY): Payer: Medicare HMO

## 2021-09-08 ENCOUNTER — Inpatient Hospital Stay (HOSPITAL_COMMUNITY)
Admission: EM | Admit: 2021-09-08 | Discharge: 2021-09-12 | DRG: 291 | Disposition: A | Discharge status: Home Health | Payer: Medicare HMO | Attending: Internal Medicine | Admitting: Internal Medicine

## 2021-09-08 DIAGNOSIS — Z885 Allergy status to narcotic agent status: Secondary | ICD-10-CM

## 2021-09-08 DIAGNOSIS — Z634 Disappearance and death of family member: Secondary | ICD-10-CM

## 2021-09-08 DIAGNOSIS — E782 Mixed hyperlipidemia: Secondary | ICD-10-CM | POA: Diagnosis present

## 2021-09-08 DIAGNOSIS — J449 Chronic obstructive pulmonary disease, unspecified: Secondary | ICD-10-CM | POA: Diagnosis present

## 2021-09-08 DIAGNOSIS — T502X5A Adverse effect of carbonic-anhydrase inhibitors, benzothiadiazides and other diuretics, initial encounter: Secondary | ICD-10-CM | POA: Diagnosis not present

## 2021-09-08 DIAGNOSIS — M109 Gout, unspecified: Secondary | ICD-10-CM | POA: Diagnosis present

## 2021-09-08 DIAGNOSIS — I501 Left ventricular failure: Secondary | ICD-10-CM | POA: Diagnosis present

## 2021-09-08 DIAGNOSIS — I878 Other specified disorders of veins: Secondary | ICD-10-CM | POA: Diagnosis present

## 2021-09-08 DIAGNOSIS — G4733 Obstructive sleep apnea (adult) (pediatric): Secondary | ICD-10-CM | POA: Diagnosis present

## 2021-09-08 DIAGNOSIS — I10 Essential (primary) hypertension: Secondary | ICD-10-CM | POA: Diagnosis present

## 2021-09-08 DIAGNOSIS — I272 Pulmonary hypertension, unspecified: Secondary | ICD-10-CM | POA: Diagnosis present

## 2021-09-08 DIAGNOSIS — I442 Atrioventricular block, complete: Secondary | ICD-10-CM | POA: Diagnosis present

## 2021-09-08 DIAGNOSIS — F419 Anxiety disorder, unspecified: Secondary | ICD-10-CM | POA: Diagnosis present

## 2021-09-08 DIAGNOSIS — Z8249 Family history of ischemic heart disease and other diseases of the circulatory system: Secondary | ICD-10-CM

## 2021-09-08 DIAGNOSIS — I130 Hypertensive heart and chronic kidney disease with heart failure and stage 1 through stage 4 chronic kidney disease, or unspecified chronic kidney disease: Principal | ICD-10-CM | POA: Diagnosis present

## 2021-09-08 DIAGNOSIS — Z9981 Dependence on supplemental oxygen: Secondary | ICD-10-CM

## 2021-09-08 DIAGNOSIS — M545 Low back pain, unspecified: Secondary | ICD-10-CM | POA: Diagnosis present

## 2021-09-08 DIAGNOSIS — Z9049 Acquired absence of other specified parts of digestive tract: Secondary | ICD-10-CM

## 2021-09-08 DIAGNOSIS — E1122 Type 2 diabetes mellitus with diabetic chronic kidney disease: Secondary | ICD-10-CM

## 2021-09-08 DIAGNOSIS — Z950 Presence of cardiac pacemaker: Secondary | ICD-10-CM

## 2021-09-08 DIAGNOSIS — I248 Other forms of acute ischemic heart disease: Secondary | ICD-10-CM | POA: Diagnosis present

## 2021-09-08 DIAGNOSIS — I5033 Acute on chronic diastolic (congestive) heart failure: Secondary | ICD-10-CM | POA: Diagnosis present

## 2021-09-08 DIAGNOSIS — D631 Anemia in chronic kidney disease: Secondary | ICD-10-CM | POA: Diagnosis present

## 2021-09-08 DIAGNOSIS — Z79890 Hormone replacement therapy (postmenopausal): Secondary | ICD-10-CM

## 2021-09-08 DIAGNOSIS — E1169 Type 2 diabetes mellitus with other specified complication: Secondary | ICD-10-CM | POA: Diagnosis present

## 2021-09-08 DIAGNOSIS — I509 Heart failure, unspecified: Secondary | ICD-10-CM | POA: Diagnosis not present

## 2021-09-08 DIAGNOSIS — Z961 Presence of intraocular lens: Secondary | ICD-10-CM | POA: Diagnosis present

## 2021-09-08 DIAGNOSIS — E876 Hypokalemia: Secondary | ICD-10-CM | POA: Diagnosis not present

## 2021-09-08 DIAGNOSIS — Z20822 Contact with and (suspected) exposure to covid-19: Secondary | ICD-10-CM | POA: Diagnosis present

## 2021-09-08 DIAGNOSIS — D7589 Other specified diseases of blood and blood-forming organs: Secondary | ICD-10-CM | POA: Diagnosis present

## 2021-09-08 DIAGNOSIS — S81802A Unspecified open wound, left lower leg, initial encounter: Secondary | ICD-10-CM | POA: Diagnosis present

## 2021-09-08 DIAGNOSIS — Z79899 Other long term (current) drug therapy: Secondary | ICD-10-CM

## 2021-09-08 DIAGNOSIS — Z794 Long term (current) use of insulin: Secondary | ICD-10-CM

## 2021-09-08 DIAGNOSIS — M1990 Unspecified osteoarthritis, unspecified site: Secondary | ICD-10-CM | POA: Diagnosis present

## 2021-09-08 DIAGNOSIS — Z87891 Personal history of nicotine dependence: Secondary | ICD-10-CM

## 2021-09-08 DIAGNOSIS — Z91199 Patient's noncompliance with other medical treatment and regimen due to unspecified reason: Secondary | ICD-10-CM

## 2021-09-08 DIAGNOSIS — Z86711 Personal history of pulmonary embolism: Secondary | ICD-10-CM

## 2021-09-08 DIAGNOSIS — Z7985 Long-term (current) use of injectable non-insulin antidiabetic drugs: Secondary | ICD-10-CM

## 2021-09-08 DIAGNOSIS — I482 Chronic atrial fibrillation, unspecified: Secondary | ICD-10-CM | POA: Diagnosis present

## 2021-09-08 DIAGNOSIS — N1832 Chronic kidney disease, stage 3b: Secondary | ICD-10-CM | POA: Diagnosis present

## 2021-09-08 DIAGNOSIS — F32A Depression, unspecified: Secondary | ICD-10-CM | POA: Diagnosis present

## 2021-09-08 DIAGNOSIS — G8929 Other chronic pain: Secondary | ICD-10-CM | POA: Diagnosis present

## 2021-09-08 DIAGNOSIS — I480 Paroxysmal atrial fibrillation: Secondary | ICD-10-CM | POA: Diagnosis present

## 2021-09-08 DIAGNOSIS — E039 Hypothyroidism, unspecified: Secondary | ICD-10-CM | POA: Diagnosis present

## 2021-09-08 DIAGNOSIS — Z713 Dietary counseling and surveillance: Secondary | ICD-10-CM

## 2021-09-08 DIAGNOSIS — K219 Gastro-esophageal reflux disease without esophagitis: Secondary | ICD-10-CM | POA: Diagnosis present

## 2021-09-08 DIAGNOSIS — Z95828 Presence of other vascular implants and grafts: Secondary | ICD-10-CM

## 2021-09-08 DIAGNOSIS — R32 Unspecified urinary incontinence: Secondary | ICD-10-CM | POA: Diagnosis not present

## 2021-09-08 DIAGNOSIS — Z86718 Personal history of other venous thrombosis and embolism: Secondary | ICD-10-CM

## 2021-09-08 DIAGNOSIS — Z7901 Long term (current) use of anticoagulants: Secondary | ICD-10-CM

## 2021-09-08 DIAGNOSIS — Z6841 Body Mass Index (BMI) 40.0 and over, adult: Secondary | ICD-10-CM

## 2021-09-08 LAB — TROPONIN I (HIGH SENSITIVITY): Troponin I (High Sensitivity): 56 ng/L — ABNORMAL HIGH (ref <18)

## 2021-09-08 LAB — CBC
HCT: 33 % — ABNORMAL LOW (ref 36.0–46.0)
Hemoglobin: 10.4 g/dL — ABNORMAL LOW (ref 12.0–15.0)
MCH: 33.5 pg (ref 26.0–34.0)
MCHC: 31.5 g/dL (ref 30.0–36.0)
MCV: 106.5 fL — ABNORMAL HIGH (ref 80.0–100.0)
Platelets: 141 10*3/uL — ABNORMAL LOW (ref 150–400)
RBC: 3.1 MIL/uL — ABNORMAL LOW (ref 3.87–5.11)
RDW: 14.7 % (ref 11.5–15.5)
WBC: 5.5 10*3/uL (ref 4.0–10.5)
nRBC: 0 % (ref 0.0–0.2)

## 2021-09-08 LAB — BASIC METABOLIC PANEL
Anion gap: 8 (ref 5–15)
BUN: 32 mg/dL — ABNORMAL HIGH (ref 8–23)
CO2: 29 mmol/L (ref 22–32)
Calcium: 8.9 mg/dL (ref 8.9–10.3)
Chloride: 106 mmol/L (ref 98–111)
Creatinine, Ser: 1.33 mg/dL — ABNORMAL HIGH (ref 0.44–1.00)
GFR, Estimated: 44 mL/min — ABNORMAL LOW (ref >60)
Glucose, Bld: 188 mg/dL — ABNORMAL HIGH (ref 70–99)
Potassium: 3.6 mmol/L (ref 3.5–5.1)
Sodium: 143 mmol/L (ref 135–145)

## 2021-09-08 LAB — PROTIME-INR
INR: 2.8 — ABNORMAL HIGH (ref 0.8–1.2)
Prothrombin Time: 29.6 seconds — ABNORMAL HIGH (ref 11.4–15.2)

## 2021-09-08 LAB — HEPATIC FUNCTION PANEL
ALT: 28 U/L (ref 0–44)
AST: 27 U/L (ref 15–41)
Albumin: 3.4 g/dL — ABNORMAL LOW (ref 3.5–5.0)
Alkaline Phosphatase: 72 U/L (ref 38–126)
Bilirubin, Direct: 0.2 mg/dL (ref 0.0–0.2)
Indirect Bilirubin: 0.3 mg/dL (ref 0.3–0.9)
Total Bilirubin: 0.5 mg/dL (ref 0.3–1.2)
Total Protein: 6 g/dL — ABNORMAL LOW (ref 6.5–8.1)

## 2021-09-08 LAB — BRAIN NATRIURETIC PEPTIDE: B Natriuretic Peptide: 490.2 pg/mL — ABNORMAL HIGH (ref 0.0–100.0)

## 2021-09-08 MED ORDER — FUROSEMIDE 10 MG/ML IJ SOLN
40.0000 mg | INTRAMUSCULAR | Status: AC
  Administered 2021-09-08: 40 mg via INTRAVENOUS
  Filled 2021-09-08: qty 4

## 2021-09-08 NOTE — ED Provider Triage Note (Signed)
Emergency Medicine Provider Triage Evaluation Note  [name] , a 66 y.o. female  was evaluated in triage.  Pt complains of cough, wheezing, shortness of breath. She reports that this has been worsening over the past 2 weeks.  She has been having posttussive emesis.  She states that she has been having worsening leg swelling bilaterally.  She feels like yesterday she started wheezing.    Physical Exam  BP (!) 170/74 (BP Location: Left Arm)   Pulse 65   Temp 97.7 F (36.5 C) (Oral)   Resp 20   Ht 5\' 3"  (1.6 m)   Wt 123.4 kg   SpO2 97%   BMI 48.18 kg/m  Gen:   Awake, no distress   Resp:  Normal effort, mild rales and rhonchi bilaterally.  Frequent cough. MSK:   Moves extremities without difficulty  Other:  Normal speech.   Medical Decision Making  Medically screening exam initiated at 7:53 PM.  Appropriate orders placed.  [name] was informed that the remainder of the evaluation will be completed by another provider, this initial triage assessment does not replace that evaluation, and the importance of remaining in the ED until their evaluation is complete.  Note: Portions of this report may have been transcribed using voice recognition software. Every effort was made to ensure accuracy; however, inadvertent computerized transcription errors may be present    Lorin Glass, PA-C 09/08/21 1954

## 2021-09-08 NOTE — ED Triage Notes (Signed)
Pt states that she has been having SOB for the past week, wheezing, pt reports that she has CHF, increased swelling in her lower legs

## 2021-09-08 NOTE — ED Provider Notes (Signed)
[facility] EMERGENCY DEPARTMENT Provider Note   CSN: [id number] Arrival date & time: 09/08/21  1928     History {Add pertinent medical, surgical, social history, OB history to HPI:1} Chief Complaint  Patient presents with   Shortness of Breath    [name] is a 66 y.o. female.  The history is provided by the patient and medical records.  Shortness of Breath Associated symptoms: chest pain    66 year old female with history of renal insufficiency, hyperlipidemia, history of PE on Coumadin, hypertension, diabetes, pulmonary hypertension, sleep apnea, chronic atrial flutter, CHF with EF 65-70%, presenting to the ED with shortness of breath.  Reports this has been ongoing for about a week now.  She estimates she has had approximately 10 pound weight gain of fluid, feels it mostly in the legs and her chest.  She does report some chest heaviness as well.  She has not had any diaphoresis, nausea, or vomiting.  She has been compliant with her torsemide without any missed doses.  Home Medications Prior to Admission medications   Medication Sig Start Date End Date Taking? Authorizing Provider  acetaminophen (TYLENOL) 500 MG tablet Take 500 mg by mouth every 6 (six) hours as needed for fever or headache (pain).    [provider]  allopurinol (ZYLOPRIM) 300 MG tablet Take 300 mg by mouth daily.    [provider]  Biotin 1000 MCG tablet Take 500 mcg by mouth daily with lunch.     [provider]  Calcium Citrate-Vitamin D (CITRACAL/VITAMIN D PO) Take 2 tablets by mouth every evening.    [provider]  citalopram (CELEXA) 20 MG tablet Take 20 mg by mouth at bedtime.    [provider]  ferrous sulfate 325 (65 FE) MG tablet Take 325 mg by mouth daily.    [provider]  insulin aspart (NOVOLOG) 100 UNIT/ML injection Inject 2 Units into the skin 3 (three) times daily with meals. 09/04/14   Velvet Bathe, MD  Insulin  Detemir (LEVEMIR FLEXTOUCH) 100 UNIT/ML Pen Inject 20 Units into the skin daily before breakfast.     [provider]  isosorbide mononitrate (IMDUR) 60 MG 24 hr tablet Take 1 tablet (60 mg total) by mouth daily. 01/01/19 10/05/19  [place] Donnamarie Poag, DO  levothyroxine (SYNTHROID) 75 MCG tablet Take 75 mcg by mouth daily before breakfast.     [provider]  Melatonin 5 MG TABS Take 10 mg by mouth at bedtime.     [provider]  Multiple Vitamin (MULITIVITAMIN WITH MINERALS) TABS Take 1 tablet by mouth daily.    [provider]  Omega-3 Fatty Acids (FISH OIL) 1000 MG CAPS Take 1,000 mg by mouth daily.     [provider]  potassium chloride SA (KLOR-CON M20) 20 MEQ tablet Take 2 tablets (40 mEq total) by mouth daily. Absolute last refill without office visit please call [phone] to schedule 07/05/21   Bensimhon, Shaune Pascal, MD  rosuvastatin (CRESTOR) 10 MG tablet Take 1 tablet (10 mg total) by mouth daily. 05/09/16   Rogue Bussing, MD  Semaglutide,0.25 or 0.5MG /DOS, (OZEMPIC, 0.25 OR 0.5 MG/DOSE,) 2 MG/1.5ML SOPN Inject 0.5 mg into the skin once a week.    [provider]  torsemide (DEMADEX) 20 MG tablet TAKE 3 TABLETS EVERY MORNING AND 2 TABLETS EVERY EVENING. CAN TAKE 3 TABS TWICE DAILY ONLY AS NEEDED 11/15/20   Bensimhon, Shaune Pascal, MD  warfarin (COUMADIN) 2.5 MG tablet TAKE 7.5  MG ON M-W-F AND 5 MG TUES, THURS, SAT AND SUN AS DIRECTED Patient taking differently: Pt taking 5 mg six days per week, and 7 mg on the seventh day. 02/10/16   Gordy Levan, MD      Allergies    Morphine and related    Review of Systems   Review of Systems  Respiratory:  Positive for shortness of breath.   Cardiovascular:  Positive for chest pain and leg swelling.  All other systems reviewed and are negative.  Physical Exam Updated Vital Signs BP (!) 157/76 (BP Location: Right Arm)   Pulse 61   Temp 98.3 F (36.8 C) (Oral)   Resp 16   Ht 5\' 3"   (1.6 m)   Wt 123.4 kg   SpO2 95%   BMI 48.18 kg/m   Physical Exam Vitals and nursing note reviewed.  Constitutional:      Appearance: She is well-developed.  HENT:     Head: Normocephalic and atraumatic.  Eyes:     Conjunctiva/sclera: Conjunctivae normal.     Pupils: Pupils are equal, round, and reactive to light.  Cardiovascular:     Rate and Rhythm: Normal rate and regular rhythm.     Heart sounds: Normal heart sounds.  Pulmonary:     Effort: Pulmonary effort is normal.     Breath sounds: Rales present. No wheezing or rhonchi.  Abdominal:     General: Bowel sounds are normal.     Palpations: Abdomen is soft.  Musculoskeletal:        General: Normal range of motion.     Cervical back: Normal range of motion.     Comments: Chronic venous stasis changes of the lower extremities bilaterally 1+ ankle edema bilaterally, grossly symmetric, DP pulses intact BLE  Skin:    General: Skin is warm and dry.  Neurological:     Mental Status: She is alert and oriented to person, place, and time.    ED Results / Procedures / Treatments   Labs (all labs ordered are listed, but only abnormal results are displayed) Labs Reviewed  CBC - Abnormal; Notable for the following components:      Result Value   RBC 3.10 (*)    Hemoglobin 10.4 (*)    HCT 33.0 (*)    MCV 106.5 (*)    Platelets 141 (*)    All other components within normal limits  BASIC METABOLIC PANEL - Abnormal; Notable for the following components:   Glucose, Bld 188 (*)    BUN 32 (*)    Creatinine, Ser 1.33 (*)    GFR, Estimated 44 (*)    All other components within normal limits  BRAIN NATRIURETIC PEPTIDE - Abnormal; Notable for the following components:   B Natriuretic Peptide 490.2 (*)    All other components within normal limits  HEPATIC FUNCTION PANEL - Abnormal; Notable for the following components:   Total Protein 6.0 (*)    Albumin 3.4 (*)    All other components within normal limits  PROTIME-INR  TROPONIN  I (HIGH SENSITIVITY)    EKG None  Radiology DG Chest 2 View  Result Date: 09/08/2021 CLINICAL DATA:  sob EXAM: CHEST - 2 VIEW COMPARISON:  December 26, 2018 FINDINGS: The cardiomediastinal silhouette is unchanged and enlarged in contour.Atherosclerotic calcifications. LEFT chest cardiac pacing device. No pleural effusion. No pneumothorax. Perihilar vascular fullness, bronchial wall cuffing and diffuse interstitial prominence. Visualized abdomen is unremarkable. Multilevel degenerative changes of the thoracic spine. IMPRESSION: Constellation of findings  are favored to reflect pulmonary edema. Electronically Signed   By: Valentino Saxon M.D.   On: 09/08/2021 20:35    Procedures Procedures  {Document cardiac monitor, telemetry assessment procedure when appropriate:1}  Medications Ordered in ED Medications  furosemide (LASIX) injection 40 mg (has no administration in time range)    ED Course/ Medical Decision Making/ A&P                           Medical Decision Making Amount and/or Complexity of Data Reviewed Labs: ordered. Radiology: ordered.  Risk Prescription drug management. Decision regarding hospitalization.   66 year old female presenting to the ED with shortness of breath.  Feels like she has "fluid buildup" again.  She reports 10 lb weight gain and increased LE swelling.  Afebrile, non-toxic.  VSS on RA.  She does have 1+ pitting edema of BLE, chronic venous stasis changes noted.  Some rales on exam.  EKG with AFIB, hx of same.   Chronically anticoagulated with coumadin.  BNP is elevated and does appear to have pulmonary edema on CXR.  Clinical picture is consistent with CHF exacerbation.  She does report some chest pain, will add troponin and INR.  Given dose of lasix.  11:27 PM Patient reassessed, she is starting to diurese.  She does report significant shortness of breath when attempting to use bedside commode.  She does not feel she will be able to handle increased  diuresis at home in her current state as she normally requires admission.  This seems reasonable.  Her troponin is 56 which appears around her baseline.  INR is therapeutic.  Will discuss with hospitalist for admission.  Final diagnoses:  None    Rx / DC Orders ED Discharge Orders     None

## 2021-09-09 ENCOUNTER — Observation Stay (HOSPITAL_COMMUNITY): Payer: Medicare HMO

## 2021-09-09 ENCOUNTER — Encounter (HOSPITAL_COMMUNITY): Payer: Self-pay | Admitting: Internal Medicine

## 2021-09-09 DIAGNOSIS — I480 Paroxysmal atrial fibrillation: Secondary | ICD-10-CM

## 2021-09-09 DIAGNOSIS — N1832 Chronic kidney disease, stage 3b: Secondary | ICD-10-CM

## 2021-09-09 DIAGNOSIS — I5033 Acute on chronic diastolic (congestive) heart failure: Secondary | ICD-10-CM | POA: Diagnosis not present

## 2021-09-09 DIAGNOSIS — Z794 Long term (current) use of insulin: Secondary | ICD-10-CM

## 2021-09-09 DIAGNOSIS — E1169 Type 2 diabetes mellitus with other specified complication: Secondary | ICD-10-CM

## 2021-09-09 DIAGNOSIS — S81802A Unspecified open wound, left lower leg, initial encounter: Secondary | ICD-10-CM | POA: Diagnosis present

## 2021-09-09 DIAGNOSIS — G4733 Obstructive sleep apnea (adult) (pediatric): Secondary | ICD-10-CM

## 2021-09-09 DIAGNOSIS — I10 Essential (primary) hypertension: Secondary | ICD-10-CM

## 2021-09-09 DIAGNOSIS — I501 Left ventricular failure: Secondary | ICD-10-CM | POA: Diagnosis not present

## 2021-09-09 DIAGNOSIS — E039 Hypothyroidism, unspecified: Secondary | ICD-10-CM | POA: Diagnosis present

## 2021-09-09 DIAGNOSIS — I5031 Acute diastolic (congestive) heart failure: Secondary | ICD-10-CM

## 2021-09-09 DIAGNOSIS — E782 Mixed hyperlipidemia: Secondary | ICD-10-CM

## 2021-09-09 DIAGNOSIS — J449 Chronic obstructive pulmonary disease, unspecified: Secondary | ICD-10-CM

## 2021-09-09 DIAGNOSIS — E1122 Type 2 diabetes mellitus with diabetic chronic kidney disease: Secondary | ICD-10-CM

## 2021-09-09 LAB — COMPREHENSIVE METABOLIC PANEL
ALT: 25 U/L (ref 0–44)
AST: 24 U/L (ref 15–41)
Albumin: 3.4 g/dL — ABNORMAL LOW (ref 3.5–5.0)
Alkaline Phosphatase: 62 U/L (ref 38–126)
Anion gap: 8 (ref 5–15)
BUN: 32 mg/dL — ABNORMAL HIGH (ref 8–23)
CO2: 30 mmol/L (ref 22–32)
Calcium: 8.9 mg/dL (ref 8.9–10.3)
Chloride: 106 mmol/L (ref 98–111)
Creatinine, Ser: 1.26 mg/dL — ABNORMAL HIGH (ref 0.44–1.00)
GFR, Estimated: 47 mL/min — ABNORMAL LOW (ref >60)
Glucose, Bld: 147 mg/dL — ABNORMAL HIGH (ref 70–99)
Potassium: 3.2 mmol/L — ABNORMAL LOW (ref 3.5–5.1)
Sodium: 144 mmol/L (ref 135–145)
Total Bilirubin: 1.2 mg/dL (ref 0.3–1.2)
Total Protein: 6 g/dL — ABNORMAL LOW (ref 6.5–8.1)

## 2021-09-09 LAB — PROTIME-INR
INR: 2.9 — ABNORMAL HIGH (ref 0.8–1.2)
Prothrombin Time: 30 seconds — ABNORMAL HIGH (ref 11.4–15.2)

## 2021-09-09 LAB — CBC WITH DIFFERENTIAL/PLATELET
Abs Immature Granulocytes: 0.02 10*3/uL (ref 0.00–0.07)
Basophils Absolute: 0 10*3/uL (ref 0.0–0.1)
Basophils Relative: 1 %
Eosinophils Absolute: 0.3 10*3/uL (ref 0.0–0.5)
Eosinophils Relative: 5 %
HCT: 32.4 % — ABNORMAL LOW (ref 36.0–46.0)
Hemoglobin: 10.2 g/dL — ABNORMAL LOW (ref 12.0–15.0)
Immature Granulocytes: 0 %
Lymphocytes Relative: 14 %
Lymphs Abs: 0.8 10*3/uL (ref 0.7–4.0)
MCH: 32.9 pg (ref 26.0–34.0)
MCHC: 31.5 g/dL (ref 30.0–36.0)
MCV: 104.5 fL — ABNORMAL HIGH (ref 80.0–100.0)
Monocytes Absolute: 0.5 10*3/uL (ref 0.1–1.0)
Monocytes Relative: 9 %
Neutro Abs: 4.1 10*3/uL (ref 1.7–7.7)
Neutrophils Relative %: 71 %
Platelets: 136 10*3/uL — ABNORMAL LOW (ref 150–400)
RBC: 3.1 MIL/uL — ABNORMAL LOW (ref 3.87–5.11)
RDW: 14.9 % (ref 11.5–15.5)
WBC: 5.8 10*3/uL (ref 4.0–10.5)
nRBC: 0 % (ref 0.0–0.2)

## 2021-09-09 LAB — ECHOCARDIOGRAM COMPLETE
AV Vena cont: 0.3 cm
Calc EF: 60.2 %
Height: 63 in
P 1/2 time: 438 msec
S' Lateral: 3.3 cm
Single Plane A2C EF: 57.3 %
Single Plane A4C EF: 61.8 %
Weight: 4352 oz

## 2021-09-09 LAB — CBG MONITORING, ED
Glucose-Capillary: 158 mg/dL — ABNORMAL HIGH (ref 70–99)
Glucose-Capillary: 104 mg/dL — ABNORMAL HIGH (ref 70–99)

## 2021-09-09 LAB — HEMOGLOBIN A1C
Hgb A1c MFr Bld: 6 % — ABNORMAL HIGH (ref 4.8–5.6)
Mean Plasma Glucose: 125.5 mg/dL

## 2021-09-09 LAB — MAGNESIUM: Magnesium: 1.8 mg/dL (ref 1.7–2.4)

## 2021-09-09 LAB — HIV ANTIBODY (ROUTINE TESTING W REFLEX): HIV Screen 4th Generation wRfx: NONREACTIVE

## 2021-09-09 LAB — GLUCOSE, CAPILLARY
Glucose-Capillary: 86 mg/dL (ref 70–99)
Glucose-Capillary: 109 mg/dL — ABNORMAL HIGH (ref 70–99)

## 2021-09-09 LAB — TROPONIN I (HIGH SENSITIVITY): Troponin I (High Sensitivity): 58 ng/L — ABNORMAL HIGH (ref <18)

## 2021-09-09 LAB — SARS CORONAVIRUS 2 BY RT PCR: SARS Coronavirus 2 by RT PCR: NEGATIVE

## 2021-09-09 MED ORDER — INSULIN ASPART 100 UNIT/ML IJ SOLN
0.0000 [IU] | Freq: Three times a day (TID) | INTRAMUSCULAR | Status: DC
  Administered 2021-09-09 – 2021-09-10 (×2): 3 [IU] via SUBCUTANEOUS
  Administered 2021-09-11: 2 [IU] via SUBCUTANEOUS

## 2021-09-09 MED ORDER — ACETAMINOPHEN 650 MG RE SUPP: 650.0000 mg | Freq: Four times a day (QID) | RECTAL | Status: DC | PRN

## 2021-09-09 MED ORDER — WARFARIN SODIUM 5 MG PO TABS: 5.0000 mg | ORAL_TABLET | Freq: Every day | ORAL | Status: DC

## 2021-09-09 MED ORDER — IPRATROPIUM-ALBUTEROL 0.5-2.5 (3) MG/3ML IN SOLN
3.0000 mL | RESPIRATORY_TRACT | Status: DC | PRN
  Administered 2021-09-10: 3 mL via RESPIRATORY_TRACT
  Filled 2021-09-09: qty 3

## 2021-09-09 MED ORDER — WARFARIN - PHARMACIST DOSING INPATIENT: Freq: Every day | Status: DC

## 2021-09-09 MED ORDER — ROSUVASTATIN CALCIUM 5 MG PO TABS
10.0000 mg | ORAL_TABLET | Freq: Every day | ORAL | Status: DC
  Administered 2021-09-09 – 2021-09-12 (×4): 10 mg via ORAL
  Filled 2021-09-09 (×4): qty 2

## 2021-09-09 MED ORDER — POTASSIUM CHLORIDE CRYS ER 20 MEQ PO TBCR
40.0000 meq | EXTENDED_RELEASE_TABLET | Freq: Two times a day (BID) | ORAL | Status: DC
  Administered 2021-09-09 – 2021-09-10 (×4): 40 meq via ORAL
  Filled 2021-09-09 (×3): qty 2

## 2021-09-09 MED ORDER — ADULT MULTIVITAMIN W/MINERALS CH
1.0000 | ORAL_TABLET | Freq: Every day | ORAL | Status: DC
  Administered 2021-09-09 – 2021-09-12 (×4): 1 via ORAL
  Filled 2021-09-09 (×4): qty 1

## 2021-09-09 MED ORDER — INSULIN ASPART 100 UNIT/ML ~~LOC~~ SOLN
8.0000 [IU] | Freq: Three times a day (TID) | SUBCUTANEOUS | Status: DC
Start: 2021-09-09 — End: 2021-09-09

## 2021-09-09 MED ORDER — POLYETHYLENE GLYCOL 3350 17 G PO PACK: 17.0000 g | PACK | Freq: Every day | ORAL | Status: DC | PRN

## 2021-09-09 MED ORDER — FUROSEMIDE 10 MG/ML IJ SOLN
80.0000 mg | Freq: Two times a day (BID) | INTRAMUSCULAR | Status: DC
  Administered 2021-09-09 – 2021-09-11 (×5): 80 mg via INTRAVENOUS
  Filled 2021-09-09 (×5): qty 8

## 2021-09-09 MED ORDER — INSULIN DETEMIR 100 UNIT/ML ~~LOC~~ SOLN
18.0000 [IU] | Freq: Every day | SUBCUTANEOUS | Status: DC
  Administered 2021-09-09 – 2021-09-12 (×4): 18 [IU] via SUBCUTANEOUS
  Filled 2021-09-09 (×4): qty 0.18

## 2021-09-09 MED ORDER — LEVOTHYROXINE SODIUM 75 MCG PO TABS
75.0000 ug | ORAL_TABLET | Freq: Every day | ORAL | Status: DC
  Administered 2021-09-09 – 2021-09-12 (×4): 75 ug via ORAL
  Filled 2021-09-09 (×4): qty 1

## 2021-09-09 MED ORDER — HYDRALAZINE HCL 20 MG/ML IJ SOLN: 10.0000 mg | Freq: Four times a day (QID) | INTRAMUSCULAR | Status: DC | PRN

## 2021-09-09 MED ORDER — LOSARTAN POTASSIUM 50 MG PO TABS
50.0000 mg | ORAL_TABLET | Freq: Every day | ORAL | Status: DC
  Administered 2021-09-09 – 2021-09-12 (×4): 50 mg via ORAL
  Filled 2021-09-09 (×4): qty 1

## 2021-09-09 MED ORDER — MELATONIN 5 MG PO TABS
10.0000 mg | ORAL_TABLET | Freq: Every day | ORAL | Status: DC
  Administered 2021-09-09 – 2021-09-11 (×3): 10 mg via ORAL
  Filled 2021-09-09 (×3): qty 2

## 2021-09-09 MED ORDER — CITALOPRAM HYDROBROMIDE 20 MG PO TABS
20.0000 mg | ORAL_TABLET | Freq: Every day | ORAL | Status: DC
  Administered 2021-09-09 – 2021-09-11 (×3): 20 mg via ORAL
  Filled 2021-09-09 (×3): qty 1

## 2021-09-09 MED ORDER — ONDANSETRON HCL 4 MG/2ML IJ SOLN: 4.0000 mg | Freq: Four times a day (QID) | INTRAMUSCULAR | Status: DC | PRN

## 2021-09-09 MED ORDER — ALLOPURINOL 300 MG PO TABS
300.0000 mg | ORAL_TABLET | Freq: Every day | ORAL | Status: DC
  Administered 2021-09-09 – 2021-09-12 (×4): 300 mg via ORAL
  Filled 2021-09-09: qty 1
  Filled 2021-09-09: qty 3
  Filled 2021-09-09 (×2): qty 1

## 2021-09-09 MED ORDER — ACETAMINOPHEN 325 MG PO TABS: 650.0000 mg | ORAL_TABLET | Freq: Four times a day (QID) | ORAL | Status: DC | PRN

## 2021-09-09 MED ORDER — INSULIN ASPART 100 UNIT/ML IJ SOLN
8.0000 [IU] | Freq: Three times a day (TID) | INTRAMUSCULAR | Status: DC
  Administered 2021-09-09 – 2021-09-11 (×5): 8 [IU] via SUBCUTANEOUS

## 2021-09-09 MED ORDER — FUROSEMIDE 10 MG/ML IJ SOLN
40.0000 mg | Freq: Once | INTRAMUSCULAR | Status: AC
Start: 2021-09-09 — End: 2021-09-09
  Administered 2021-09-09: 40 mg via INTRAVENOUS
  Filled 2021-09-09: qty 4

## 2021-09-09 MED ORDER — WARFARIN SODIUM 5 MG PO TABS
5.0000 mg | ORAL_TABLET | Freq: Once | ORAL | Status: AC
Start: 2021-09-09 — End: 2021-09-09
  Administered 2021-09-09: 5 mg via ORAL
  Filled 2021-09-09 (×2): qty 1

## 2021-09-09 MED ORDER — ONDANSETRON HCL 4 MG PO TABS: 4.0000 mg | ORAL_TABLET | Freq: Four times a day (QID) | ORAL | Status: DC | PRN

## 2021-09-09 MED ORDER — ISOSORBIDE MONONITRATE ER 60 MG PO TB24
60.0000 mg | ORAL_TABLET | Freq: Every day | ORAL | Status: DC
  Administered 2021-09-09 – 2021-09-10 (×2): 60 mg via ORAL
  Filled 2021-09-09: qty 1
  Filled 2021-09-09: qty 2

## 2021-09-09 NOTE — Progress Notes (Signed)
  Echocardiogram 2D Echocardiogram has been performed.  [name] 09/09/2021, 11:53 AM

## 2021-09-09 NOTE — Assessment & Plan Note (Signed)
.   Resume home regimen of Synthroid 

## 2021-09-09 NOTE — Assessment & Plan Note (Signed)
.   Patient been placed on Accu-Cheks before every meal and nightly with sliding scale insulin . Holding home regimen of hypoglycemics . Placing patient on home regimen of basal bolus insulin therapy . Hemoglobin A1C ordered . Diabetic Diet

## 2021-09-09 NOTE — Assessment & Plan Note (Signed)
.   Patient presenting with symptoms of dyspnea on exertion, and peripheral edema consistent with acute on chronic diastolic congestive heart failure . Work-up reveals elevated BNP and evidence of acute pulmonary edema on chest imaging . Patient has been placed on intravenous diuretics.  Treated with 60 mg of intravenous furosemide twice daily. . Strict input and output monitoring . Supplemental oxygen for bouts of hypoxia . Monitoring renal function and electrolytes with serial chemistires . Daily weights . Monitoring patient on telemetry . Echocardiogram ordered for the morning

## 2021-09-09 NOTE — Assessment & Plan Note (Signed)
Strict intake and output monitoring Creatinine near baseline Minimizing nephrotoxic agents as much as possible Serial chemistries to monitor renal function and electrolytes  

## 2021-09-09 NOTE — Assessment & Plan Note (Signed)
.   Resume patients home regimen of oral antihypertensives . Titrate antihypertensive regimen as necessary to achieve adequate BP control . PRN intravenous antihypertensives for excessively elevated blood pressure   

## 2021-09-09 NOTE — Assessment & Plan Note (Addendum)
   Patient has no knowledge of this diagnosis  No evidence of COPD exacerbation this time  As needed bronchodilator therapy for episodic shortness of breath and wheezing.

## 2021-09-09 NOTE — ED Notes (Signed)
Patient is resting comfortably. 

## 2021-09-09 NOTE — Assessment & Plan Note (Signed)
   Currently rate controlled.  Continue home regimen of anticoagulation with Coumadin  Not on any AV nodal blocking agents  Monitoring on telemetry

## 2021-09-09 NOTE — Assessment & Plan Note (Signed)
   Circumferential area of shall on the anterior surface of the left lower extremity present on admission, likely secondary to a ruptured blister that has sloughed off  Significant serosanguineous drainage noted from this wound, likely secondary to peripheral edema  Regular dressing changes, wound care consultation.

## 2021-09-09 NOTE — ED Notes (Addendum)
Pt presents with large venous stasis ulcer on shin. Weeping serous fluid; no signs of infection. RN dressed with mepilex pad 6X6 and gauze wrap. Pt tolerated well.

## 2021-09-09 NOTE — Progress Notes (Signed)
ANTICOAGULATION CONSULT NOTE - Initial Consult  Pharmacy Consult for Warfarin  Indication: Afib, History of DVT/PE  Allergies  Allergen Reactions   Morphine And Related Rash    Patient Measurements: Height: 5\' 3"  (160 cm) Weight: 123.4 kg (272 lb) IBW/kg (Calculated) : 52.4  Vital Signs: Temp: 98.3 F (36.8 C) (06/04 2119) Temp Source: Oral (06/04 2119) BP: 126/75 (06/05 0148) Pulse Rate: 81 (06/05 0148)  Labs: Recent Labs    09/08/21 1944 09/08/21 2216 09/09/21 0040  HGB 10.4*  --   --   HCT 33.0*  --   --   PLT 141*  --   --   LABPROT  --  29.6*  --   INR  --  2.8*  --   CREATININE 1.33*  --   --   TROPONINIHS  --  56* 58*    Estimated Creatinine Clearance: 53.8 mL/min (A) (by C-G formula based on SCr of 1.33 mg/dL (H)).   Medical History: Past Medical History:  Diagnosis Date   A-fib ([place])    Anemia    Anticoagulated on Coumadin, chronically 09/03/2011   Anxiety    Arthritis    "right hip; both knees; left wrist/shoulder; back" (01/19/2013"   Bleeding on Coumadin 08/2012; 01/18/2013   [facility] admissions (01/19/2013)   CHF (congestive heart failure) ([place])    "2-3 times" (01/19/2013)   Chronic lower back pain    Depression    DVT (deep venous thrombosis) (HCC) 10 years ago   numerous/notes 01/18/2013   GERD (gastroesophageal reflux disease)    Gout    Headache(784.0)    "maybe weekly" (01/19/2013)   Heart murmur    High cholesterol    "been off RX for this at one time" (01/18/2013)   History of blood transfusion 1983; 04/2012   "3 w/ childbirth; hospitalized for pain" (01/19/2013)   Hypertension    Hypothyroidism    Migraines    "twice/yr maybe" (01/19/2013)   OSA (obstructive sleep apnea)    "sent me for test in 04/2012; never ordered mask, etc" (01/19/2013)   PE (pulmonary thromboembolism) ([place]) 3 years ag0   3/notes 01/18/2013   Pneumonia before 2011   "once' (01/18/2013)   Renal disorder    kindey function low; "Metformin was destroying my  kidneys" (01/19/2013)   Shortness of breath    "only related to my CHF" (01/18/2013)   Swelling of hand 08/31/2014   RT HAND   Type II diabetes mellitus ([place])    UTI (urinary tract infection) 08/31/2014      Assessment: 66 y/o F presents to the ED with shortness of breath, on warfarin PTA for afib and history of DVT/PE. INR is therapeutic at 2.8. Other labs above.   Goal of Therapy:  INR 2-3 Monitor platelets by anticoagulation protocol: Yes   Plan:  Warfarin 5 mg PO x 1 today at 1600 Daily PT/INR Monitor for bleeding  Narda Bonds, PharmD, BCPS Clinical Pharmacist Phone: [phone]

## 2021-09-09 NOTE — Progress Notes (Signed)
[name]  OMB:[id number] DOB: Feb 17, 1956 DOA: 09/08/2021 PCP: [name], MD    Brief Narrative:  66 year old with a history of CKD stage IIIb (baseline creatinine 1.5), HTN, HLD, DM2, COPD, paroxysmal atrial fibrillation on Coumadin, diastolic CHF, CHB status post pacemaker, DVT and PE, and hypothyroidism who presented to the ER with shortness of breath and worsening peripheral edema over a 1 week timeframe.  This was associated with a sense of chest heaviness and a 10 pound weight gain.  CXR in the ER was suggestive of pulmonary edema and physical exam noted volume overload.  Consultants:  None  Goals of Care:  Code Status: Full Code   DVT prophylaxis: Warfarin  Interim Hx: Afebrile.  Vital signs stable.  Assessment & Plan:  Acute exacerbation of chronic diastolic CHF - Acute cardiogenic pulmonary edema  Chronic kidney disease, stage 3b  Paroxysmal atrial fibrillation   Mild hypokalemia Due to diuresis -replace -magnesium normal  Type 2 diabetes mellitus CBG controlled at present  Essential hypertension Blood pressure improving with diuresis  COPD  Well compensated presently  Hypothyroidism Continue usual home Synthroid dose  Mixed diabetic hyperlipidemia  Continue usual home medical therapy  Wound of left leg Lesion on anterior surface of the left lower extremity consistent with a ruptured blister that has sloughed off -no evidence of infection at time of exam today  Macrocytosis Check G53 and folic acid levels  Obstructive sleep apnea noncompliant with CPAP at home due to intolerance  Family Communication: No family present at time of exam Disposition: PT/OT evaluations -anticipate discharge home in approximately 48 hours   Objective: Blood pressure (!) 145/60, pulse 79, temperature 98.3 F (36.8 C), temperature source Oral, resp. rate 20, height 5\' 3"  (1.6 m), weight 123.4 kg, SpO2 96 %.  Intake/Output Summary (Last 24 hours) at 09/09/2021  1025 Last data filed at 09/09/2021 0943 Gross per 24 hour  Intake --  Output 1300 ml  Net -1300 ml   Filed Weights   09/08/21 1933  Weight: 123.4 kg    Examination: Follow-up exam completed  CBC: Recent Labs  Lab 09/08/21 1944 09/09/21 0435  WBC 5.5 5.8  NEUTROABS  --  4.1  HGB 10.4* 10.2*  HCT 33.0* 32.4*  MCV 106.5* 104.5*  PLT 141* 646*   Basic Metabolic Panel: Recent Labs  Lab 09/08/21 1944 09/09/21 0435  NA 143 144  K 3.6 3.2*  CL 106 106  CO2 29 30  GLUCOSE 188* 147*  BUN 32* 32*  CREATININE 1.33* 1.26*  CALCIUM 8.9 8.9  MG  --  1.8   GFR: Estimated Creatinine Clearance: 56.8 mL/min (A) (by C-G formula based on SCr of 1.26 mg/dL (H)).  Liver Function Tests: Recent Labs  Lab 09/08/21 1944 09/09/21 0435  AST 27 24  ALT 28 25  ALKPHOS 72 62  BILITOT 0.5 1.2  PROT 6.0* 6.0*  ALBUMIN 3.4* 3.4*    Coagulation Profile: Recent Labs  Lab 09/08/21 2216 09/09/21 0435  INR 2.8* 2.9*     HbA1C: Hgb A1c MFr Bld  Date/Time Value Ref Range Status  09/09/2021 04:35 AM 6.0 (H) 4.8 - 5.6 % Final    Comment:    (NOTE) Pre diabetes:          5.7%-6.4%  Diabetes:              >6.4%  Glycemic control for   <7.0% adults with diabetes   08/29/2018 05:48 PM 6.3 (H) 4.8 - 5.6 % Final  Comment:    (NOTE)         Prediabetes: 5.7 - 6.4         Diabetes: >6.4         Glycemic control for adults with diabetes: <7.0     CBG: Recent Labs  Lab 09/09/21 0716  GLUCAP 158*    Scheduled Meds:  allopurinol  300 mg Oral Daily   citalopram  20 mg Oral QHS   furosemide  80 mg Intravenous BID   insulin aspart  0-15 Units Subcutaneous TID AC & HS   insulin aspart  8 Units Subcutaneous TID WC   insulin detemir  18 Units Subcutaneous Daily   isosorbide mononitrate  60 mg Oral Daily   levothyroxine  75 mcg Oral QAC breakfast   losartan  50 mg Oral Daily   melatonin  10 mg Oral QHS   multivitamin with minerals  1 tablet Oral Daily   rosuvastatin  10  mg Oral Daily   warfarin  5 mg Oral ONCE-1600   Warfarin - Pharmacist Dosing Inpatient   Does not apply q1600      LOS: 0 days   Cherene Altes, MD [facility] Office  [phone] Pager - Text Page per Shea Evans  If 7PM-7AM, please contact night-coverage per Amion 09/09/2021, 10:25 AM

## 2021-09-09 NOTE — Assessment & Plan Note (Signed)
   Patient reports that she is noncompliant with CPAP at home due to intolerance  Supplemental oxygen with sleep as needed for bouts of hypoxia

## 2021-09-09 NOTE — Assessment & Plan Note (Signed)
.   Continuing home regimen of lipid lowering therapy.  

## 2021-09-09 NOTE — H&P (Signed)
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  Patient explains that for approximate the past week she has been experiencing shortness of breath.  Shortness breath was initially mild in intensity but progressively has become more and more severe.  Shortness of breath has been associated with an approximate 10 pound weight gain over the span of time in addition to increasing swelling in both legs.  Patient does also complain of intermittent chest discomfort that she describes as chest heaviness.  This is mild in intensity.  Patient symptoms continue to worsen until she eventually presented to [facility] emergency department for evaluation.  Upon evaluation in the emergency department patient was found to have an elevated BNP of 490 with chest x-ray revealing findings of acute pulmonary edema with physical exam findings concerning for congestive heart failure.  40 mg of intravenous Lasix were administered and the hospitalist group was then called for assessment for admission to the hospital.  Review of Systems: Review of Systems  Respiratory:  Positive for shortness of breath.   Cardiovascular:  Positive for chest pain and leg swelling.  All  other systems reviewed and are negative.   Past Medical History:  Diagnosis Date   A-fib ([place])    Anemia    Anticoagulated on Coumadin, chronically 09/03/2011   Anxiety    Arthritis    "right hip; both knees; left wrist/shoulder; back" (01/19/2013"   Bleeding on Coumadin 08/2012; 01/18/2013   [facility] admissions (01/19/2013)   CHF (congestive heart failure) ([place])    "2-3 times" (01/19/2013)   Chronic lower back pain    Depression    DVT (deep venous thrombosis) (HCC) 10 years ago   numerous/notes 01/18/2013   GERD (gastroesophageal reflux disease)    Gout    Headache(784.0)    "maybe weekly" (01/19/2013)   Heart murmur    High cholesterol    "been off RX for this at one time" (01/18/2013)   History of blood transfusion 1983; 04/2012   "3 w/ childbirth; hospitalized for pain" (01/19/2013)   Hypertension    Hypothyroidism    Migraines    "twice/yr maybe" (01/19/2013)   Obstructive sleep apnea 05/03/2012   OSA (obstructive sleep apnea)    "sent me for test in 04/2012; never ordered mask, etc" (01/19/2013)   PE (pulmonary thromboembolism) ([place]) 3 years ag0   3/notes 01/18/2013   Pneumonia before 2011   "once' (01/18/2013)   Renal disorder    kindey function low; "Metformin was destroying my kidneys" (01/19/2013)   Shortness of breath    "only related to my CHF" (01/18/2013)   Swelling of hand 08/31/2014   RT HAND   Type II diabetes mellitus ([place])    UTI (urinary tract infection) 08/31/2014    Past Surgical History:  Procedure Laterality Date   CARDIAC CATHETERIZATION N/A 03/13/2015   Procedure:  Right/Left Heart Cath and Coronary Angiography;  Surgeon: Adrian Prows, MD;  Location: [place] [facility];  Service: Cardiovascular;  Laterality: N/A;   CATARACT EXTRACTION W/ INTRAOCULAR LENS  IMPLANT, BILATERAL Bilateral 2006-2011   CESAREAN SECTION  1983   CHOLECYSTECTOMY  ~ 2002   COLONOSCOPY N/A 01/21/2013   Procedure: COLONOSCOPY;  Surgeon: Beryle Beams, MD;  Location: [place];  Service: Endoscopy;  Laterality: N/A;   EYE SURGERY Bilateral    "multiple" (01/18/2013)   PACEMAKER IMPLANT N/A 08/31/2018   Symptomatic bradycardia due to mobitz II second degree AV block, permanent afib/ atypical atrial flutter implanted by Dr Rayann Heman   PARS PLANA REPAIR OF RETINAL DEATACHMENT Right    PARS PLANA VITRECTOMY Bilateral 2004-2006   "several" (01/18/2013)   REFRACTIVE SURGERY Bilateral    "for stigmatism" (01/18/2013)   REFRACTIVE SURGERY Left ~ 11/2012   "to puff it up cause vision got hazy" (01/18/2013)   RIGHT HEART CATH N/A 12/30/2018   Procedure: RIGHT HEART CATH;  Surgeon: Jolaine Artist, MD;  Location: [place] [facility];  Service: Cardiovascular;  Laterality: N/A;   VENA CAVA FILTER PLACEMENT  2011?    Social History:  reports that she quit smoking about 33 years ago. Her smoking use included cigarettes. She has a 1.50 pack-year smoking history. She has never used smokeless tobacco. She reports that she does not currently use alcohol. She reports that she does not use drugs.  Allergies  Allergen Reactions   Morphine And Related Rash    Family History  Problem Relation Age of Onset   Cerebral aneurysm Mother    Hypertension Father    Cerebral aneurysm Maternal Grandfather    Cerebral aneurysm Maternal Aunt    Cancer Maternal Uncle     Prior to Admission medications   Medication Sig Start Date End Date Taking? Authorizing Provider  acetaminophen (TYLENOL) 500 MG tablet Take 500 mg by mouth every 6 (six) hours as needed for fever or headache (pain).   Yes [provider]  allopurinol (ZYLOPRIM) 300 MG tablet Take 300 mg by mouth daily.   Yes [provider]  Biotin 1000 MCG tablet Take 500 mcg by mouth daily with lunch.    Yes [provider]  Calcium Citrate-Vitamin D (CITRACAL/VITAMIN D PO) Take 2 tablets by mouth every evening.   Yes [provider]  citalopram (CELEXA) 20 MG tablet Take 20 mg by mouth at  bedtime.   Yes [provider]  insulin aspart (NOVOLOG) 100 UNIT/ML injection Inject 2 Units into the skin 3 (three) times daily with meals. 09/04/14  Yes Velvet Bathe, MD  insulin detemir (LEVEMIR) 100 UNIT/ML FlexPen Inject 18 Units into the skin daily before breakfast.   Yes [provider]  isosorbide mononitrate (IMDUR) 60 MG 24 hr tablet Take 1 tablet (60 mg total) by mouth daily. 01/01/19 09/09/21 Yes [place] Donnamarie Poag, DO  levothyroxine (SYNTHROID) 75 MCG tablet Take 75 mcg by mouth daily before breakfast.    Yes [provider]  losartan (COZAAR) 50 MG tablet Take 50 mg by mouth daily. 06/18/21  Yes [provider]  Melatonin 5 MG TABS Take 10 mg by mouth at bedtime.    Yes [provider]  Multiple Vitamin (MULITIVITAMIN WITH MINERALS) TABS Take 1 tablet by mouth daily.   Yes [provider]  Omega-3 Fatty Acids (FISH OIL) 1000 MG CAPS Take 1,000 mg by mouth daily.    Yes [provider]  rosuvastatin (CRESTOR)  10 MG tablet Take 1 tablet (10 mg total) by mouth daily. 05/09/16  Yes Rogue Bussing, MD  torsemide (DEMADEX) 20 MG tablet TAKE 3 TABLETS EVERY MORNING AND 2 TABLETS EVERY EVENING. CAN TAKE 3 TABS TWICE DAILY ONLY AS NEEDED Patient taking differently: See admin instructions. Takes 3 tablets every morning and 2 tablets every evening, can take 3 tabs twice daily as needed 11/15/20  Yes Bensimhon, Shaune Pascal, MD  warfarin (COUMADIN) 2.5 MG tablet TAKE 7.5 MG ON M-W-F AND 5 MG TUES, THURS, SAT AND SUN AS DIRECTED Patient taking differently: Take 5 mg by mouth daily. Pt taking 5 mg six days per week, and 7 mg on the seventh day. 02/10/16  Yes Livesay, Lennis P, MD  potassium chloride SA (KLOR-CON M20) 20 MEQ tablet Take 2 tablets (40 mEq total) by mouth daily. Absolute last refill without office visit please call [phone] to schedule Patient not taking: Reported on 09/09/2021 07/05/21   Bensimhon, Shaune Pascal, MD   Semaglutide,0.25 or 0.5MG /DOS, (OZEMPIC, 0.25 OR 0.5 MG/DOSE,) 2 MG/1.5ML SOPN Inject 0.25 mg into the skin once a week.    [provider]    Physical Exam:  Vitals:   09/09/21 0000 09/09/21 0148 09/09/21 0200 09/09/21 0230  BP: (!) 163/83 126/75 (!) 153/92 (!) 164/89  Pulse: 68 81 63 (!) 59  Resp: 17     Temp:      TempSrc:      SpO2: 97% 99% 97% 96%  Weight:      Height:        Constitutional: Awake alert and oriented x3, patient is in mild respiratory distress. Skin: Marked dusky discoloration of the anterior surfaces of the bilateral lower extremities that is chronic.  4 cm diameter circumferential shallow ulceration noted over the anterior surface of the left leg with serosanguineous drainage without foul smell.  Good skin turgor noted.   Eyes: Pupils are equally reactive to light.  No evidence of scleral icterus or conjunctival pallor.  ENMT: Moist mucous membranes noted.  Posterior pharynx clear of any exudate or lesions.   Neck: normal, supple, no masses, no thyromegaly.  Markedly elevated jugular venous pulse at 45 degrees.   Respiratory: Notable mild rales in the bilateral mid and lower fields with scattered rhonchi.  Very occasional expiratory wheezing noted.  Normal respiratory effort. No accessory muscle use.  Cardiovascular: Regular rate and rhythm, no murmurs / rubs / gallops.  Mild bilateral lower extremity pitting edema that tracks in the feet all the way to just below the knees.  2+ pedal pulses. No carotid bruits.  Chest:   Nontender without crepitus or deformity.   Back:   Nontender without crepitus or deformity. Abdomen: Abdomen is protuberant but soft and nontender.  No evidence of intra-abdominal masses.  Positive bowel sounds noted in all quadrants.   Musculoskeletal: No joint deformity upper and lower extremities. Good ROM, no contractures. Normal muscle tone.  Neurologic: CN 2-12 grossly intact. Sensation intact.  Patient moving all 4 extremities  spontaneously.  Patient is following all commands.  Patient is responsive to verbal stimuli.   Psychiatric: Patient exhibits normal mood with appropriate affect.  Patient seems to possess insight as to their current situation.    Data Reviewed:  I have personally reviewed and interpreted labs, imaging.  Significant findings are:  2 sets of troponins obtained that are 56 and 58. COVID-19 PCR testing negative. BNP 490.2. Hepatic function panel revealing protein of 6.0, albumin 3.4, AST of 27, ALT  28 Chemistry revealing sodium 143, potassium 3.6, BUN 32, creatinine 1.33 CBC revealing white blood cell count of 5.5, hemoglobin 10.4, hematocrit 33, platelet count 141.  EKG: Personally reviewed.  Rhythm is atrial fibrillation with heart rate of 67 bpm.  Evidence of left bundle branch block.  QTc 473 ms.  No dynamic ST segment changes appreciated.    Assessment and Plan: * Acute on chronic diastolic CHF (congestive heart failure) (HCC) Patient presenting with symptoms of dyspnea on exertion, and peripheral edema consistent with acute on chronic diastolic congestive heart failure Work-up reveals elevated BNP and evidence of acute pulmonary edema on chest imaging Patient has been placed on intravenous diuretics.  Treated with 60 mg of intravenous furosemide twice daily. Strict input and output monitoring Supplemental oxygen for bouts of hypoxia Monitoring renal function and electrolytes with serial chemistires Daily weights Monitoring patient on telemetry Echocardiogram ordered for the morning     Acute cardiogenic pulmonary edema ([place]) Please see assessment and plan above.  Chronic kidney disease, stage 3b (HCC) Strict intake and output monitoring Creatinine near baseline Minimizing nephrotoxic agents as much as possible Serial chemistries to monitor renal function and electrolytes   Paroxysmal atrial fibrillation (HCC) Currently rate controlled. Continue home regimen of  anticoagulation with Coumadin Not on any AV nodal blocking agents Monitoring on telemetry   Type 2 diabetes mellitus with stage 3b chronic kidney disease, with long-term current use of insulin ([place]) Patient been placed on Accu-Cheks before every meal and nightly with sliding scale insulin Holding home regimen of hypoglycemics Placing patient on home regimen of basal bolus insulin therapy Hemoglobin A1C ordered Diabetic Diet   Essential hypertension Resume patients home regimen of oral antihypertensives Titrate antihypertensive regimen as necessary to achieve adequate BP control PRN intravenous antihypertensives for excessively elevated blood pressure    COPD (chronic obstructive pulmonary disease) ([place]) Patient has no knowledge of this diagnosis No evidence of COPD exacerbation this time As needed bronchodilator therapy for episodic shortness of breath and wheezing.   Hypothyroidism Resume home regimen of Synthroid    Mixed diabetic hyperlipidemia associated with type 2 diabetes mellitus ([place]) Continuing home regimen of lipid lowering therapy.   Wound of left leg, initial encounter Circumferential area of shall on the anterior surface of the left lower extremity present on admission, likely secondary to a ruptured blister that has sloughed off Significant serosanguineous drainage noted from this wound, likely secondary to peripheral edema Regular dressing changes, wound care consultation.  Obstructive sleep apnea Patient reports that she is noncompliant with CPAP at home due to intolerance Supplemental oxygen with sleep as needed for bouts of hypoxia       Code Status:  Full code  code status decision has been confirmed with: patient Family Communication: deferred   Consults: none  Severity of Illness:  The appropriate patient status for this patient is OBSERVATION. Observation status is judged to be reasonable and necessary in order to provide the required  intensity of service to ensure the patient's safety. The patient's presenting symptoms, physical exam findings, and initial radiographic and laboratory data in the context of their medical condition is felt to place them at decreased risk for further clinical deterioration. Furthermore, it is anticipated that the patient will be medically stable for discharge from the hospital within 2 midnights of admission.   Author:  Vernelle Emerald MD  09/09/2021 3:17 AM

## 2021-09-09 NOTE — Assessment & Plan Note (Signed)
·   Please see assessment and plan above °

## 2021-09-09 NOTE — ED Notes (Signed)
Pt was short of breath after getting on bedside commode. Her sats maintain fine.

## 2021-09-10 ENCOUNTER — Observation Stay (HOSPITAL_COMMUNITY): Payer: Medicare HMO

## 2021-09-10 DIAGNOSIS — I5033 Acute on chronic diastolic (congestive) heart failure: Secondary | ICD-10-CM | POA: Diagnosis present

## 2021-09-10 DIAGNOSIS — G4733 Obstructive sleep apnea (adult) (pediatric): Secondary | ICD-10-CM | POA: Diagnosis present

## 2021-09-10 DIAGNOSIS — I501 Left ventricular failure: Secondary | ICD-10-CM | POA: Diagnosis not present

## 2021-09-10 DIAGNOSIS — Z95828 Presence of other vascular implants and grafts: Secondary | ICD-10-CM | POA: Diagnosis not present

## 2021-09-10 DIAGNOSIS — E782 Mixed hyperlipidemia: Secondary | ICD-10-CM | POA: Diagnosis present

## 2021-09-10 DIAGNOSIS — N1832 Chronic kidney disease, stage 3b: Secondary | ICD-10-CM | POA: Diagnosis present

## 2021-09-10 DIAGNOSIS — D631 Anemia in chronic kidney disease: Secondary | ICD-10-CM | POA: Diagnosis present

## 2021-09-10 DIAGNOSIS — E039 Hypothyroidism, unspecified: Secondary | ICD-10-CM | POA: Diagnosis present

## 2021-09-10 DIAGNOSIS — I442 Atrioventricular block, complete: Secondary | ICD-10-CM | POA: Diagnosis present

## 2021-09-10 DIAGNOSIS — I482 Chronic atrial fibrillation, unspecified: Secondary | ICD-10-CM | POA: Diagnosis present

## 2021-09-10 DIAGNOSIS — G8929 Other chronic pain: Secondary | ICD-10-CM | POA: Diagnosis present

## 2021-09-10 DIAGNOSIS — J449 Chronic obstructive pulmonary disease, unspecified: Secondary | ICD-10-CM | POA: Diagnosis present

## 2021-09-10 DIAGNOSIS — F419 Anxiety disorder, unspecified: Secondary | ICD-10-CM | POA: Diagnosis present

## 2021-09-10 DIAGNOSIS — I248 Other forms of acute ischemic heart disease: Secondary | ICD-10-CM | POA: Diagnosis present

## 2021-09-10 DIAGNOSIS — I509 Heart failure, unspecified: Secondary | ICD-10-CM | POA: Diagnosis present

## 2021-09-10 DIAGNOSIS — D7589 Other specified diseases of blood and blood-forming organs: Secondary | ICD-10-CM | POA: Diagnosis present

## 2021-09-10 DIAGNOSIS — I272 Pulmonary hypertension, unspecified: Secondary | ICD-10-CM | POA: Diagnosis present

## 2021-09-10 DIAGNOSIS — N1831 Chronic kidney disease, stage 3a: Secondary | ICD-10-CM

## 2021-09-10 DIAGNOSIS — M545 Low back pain, unspecified: Secondary | ICD-10-CM | POA: Diagnosis present

## 2021-09-10 DIAGNOSIS — M109 Gout, unspecified: Secondary | ICD-10-CM | POA: Diagnosis present

## 2021-09-10 DIAGNOSIS — E1169 Type 2 diabetes mellitus with other specified complication: Secondary | ICD-10-CM | POA: Diagnosis present

## 2021-09-10 DIAGNOSIS — I130 Hypertensive heart and chronic kidney disease with heart failure and stage 1 through stage 4 chronic kidney disease, or unspecified chronic kidney disease: Secondary | ICD-10-CM | POA: Diagnosis present

## 2021-09-10 DIAGNOSIS — K219 Gastro-esophageal reflux disease without esophagitis: Secondary | ICD-10-CM | POA: Diagnosis present

## 2021-09-10 DIAGNOSIS — E1122 Type 2 diabetes mellitus with diabetic chronic kidney disease: Secondary | ICD-10-CM | POA: Diagnosis present

## 2021-09-10 DIAGNOSIS — Z6841 Body Mass Index (BMI) 40.0 and over, adult: Secondary | ICD-10-CM | POA: Diagnosis not present

## 2021-09-10 DIAGNOSIS — F32A Depression, unspecified: Secondary | ICD-10-CM | POA: Diagnosis present

## 2021-09-10 DIAGNOSIS — Z20822 Contact with and (suspected) exposure to covid-19: Secondary | ICD-10-CM | POA: Diagnosis present

## 2021-09-10 LAB — PROTIME-INR
INR: 2.6 — ABNORMAL HIGH (ref 0.8–1.2)
Prothrombin Time: 27.9 seconds — ABNORMAL HIGH (ref 11.4–15.2)

## 2021-09-10 LAB — BASIC METABOLIC PANEL
Anion gap: 6 (ref 5–15)
BUN: 32 mg/dL — ABNORMAL HIGH (ref 8–23)
CO2: 31 mmol/L (ref 22–32)
Calcium: 9.1 mg/dL (ref 8.9–10.3)
Chloride: 105 mmol/L (ref 98–111)
Creatinine, Ser: 1.26 mg/dL — ABNORMAL HIGH (ref 0.44–1.00)
GFR, Estimated: 47 mL/min — ABNORMAL LOW (ref >60)
Glucose, Bld: 98 mg/dL (ref 70–99)
Potassium: 3.9 mmol/L (ref 3.5–5.1)
Sodium: 142 mmol/L (ref 135–145)

## 2021-09-10 LAB — GLUCOSE, CAPILLARY
Glucose-Capillary: 103 mg/dL — ABNORMAL HIGH (ref 70–99)
Glucose-Capillary: 181 mg/dL — ABNORMAL HIGH (ref 70–99)
Glucose-Capillary: 118 mg/dL — ABNORMAL HIGH (ref 70–99)
Glucose-Capillary: 76 mg/dL (ref 70–99)

## 2021-09-10 LAB — VITAMIN B12: Vitamin B-12: 584 pg/mL (ref 180–914)

## 2021-09-10 LAB — FOLATE: Folate: 25.7 ng/mL (ref >5.9)

## 2021-09-10 LAB — TROPONIN I (HIGH SENSITIVITY): Troponin I (High Sensitivity): 51 ng/L — ABNORMAL HIGH (ref <18)

## 2021-09-10 MED ORDER — METOLAZONE 2.5 MG PO TABS
2.5000 mg | ORAL_TABLET | Freq: Once | ORAL | Status: AC
  Administered 2021-09-10: 2.5 mg via ORAL
  Filled 2021-09-10: qty 1

## 2021-09-10 MED ORDER — WARFARIN SODIUM 5 MG PO TABS
5.0000 mg | ORAL_TABLET | Freq: Once | ORAL | Status: AC
  Administered 2021-09-10: 5 mg via ORAL
  Filled 2021-09-10: qty 1

## 2021-09-10 MED ORDER — NITROGLYCERIN 0.4 MG SL SUBL
0.4000 mg | SUBLINGUAL_TABLET | Freq: Once | SUBLINGUAL | Status: AC
Start: 2021-09-10 — End: 2021-09-10
  Administered 2021-09-10: 0.4 mg via SUBLINGUAL
  Filled 2021-09-10: qty 1

## 2021-09-10 MED ORDER — HYDROMORPHONE HCL 1 MG/ML IJ SOLN
0.5000 mg | INTRAMUSCULAR | Status: DC | PRN
  Administered 2021-09-10 – 2021-09-12 (×4): 0.5 mg via INTRAVENOUS
  Filled 2021-09-10 (×4): qty 0.5

## 2021-09-10 MED ORDER — SPIRONOLACTONE 12.5 MG HALF TABLET
12.5000 mg | ORAL_TABLET | Freq: Every day | ORAL | Status: DC
  Administered 2021-09-10 – 2021-09-12 (×3): 12.5 mg via ORAL
  Filled 2021-09-10 (×3): qty 1

## 2021-09-10 MED ORDER — MAGNESIUM SULFATE 2 GM/50ML IV SOLN
2.0000 g | Freq: Once | INTRAVENOUS | Status: AC
  Administered 2021-09-10: 2 g via INTRAVENOUS
  Filled 2021-09-10: qty 50

## 2021-09-10 NOTE — Progress Notes (Addendum)
Advanced Heart Failure Team Consult Note   Primary Physician: [name], MD PCP-Cardiologist:  None  Reason for Consultation: Heart Failure   HPI:    [name] is seen today for evaluation of heart failure at the request of Dr Bonner Puna.  [name] is a 66 year old with a history of obesity, HTN, DM, atrial fib, DVT, PE x3 , s/p IVC filter placed in 2013, CHB s/p MDT single chamber PPM 08/2018, OSA, and chronic diastolic heart failure.    R/LHC in 2016 showed no significant coronary disease, cardiac output 8.9 and PA pressured 41/13.     In January 2020 she had presyncopal episode and had some intermittent dizziness.  She told her PCP and was instructed to keep a log. In May she had presyncope and a syncopal episode. She had a fall off her porch. This was determined to be in the setting of CHB. MDT single chamber PPM placed. She also had minimally displaced C6 fracture.    Admitted 2020 with A/C HFpEF. Diuresed with IV lasix.  PYP scan not convincing for amyloid.  EP saw 9/22 reprogrammed to VVI 50 due to asynchronous V pacing.  Last seen by Dr Haroldine Laws in 2021. At  that time she was doing pretty good and continued on torsemide 60/40. She has not been seen since that time. Says she has been depressed since her husband passed away and put off calling the office.   Admitted with A/C HFpEF suspect in the setting of high sodium diet. She had been eating Ramen Noodles 1-2x per week. Weight at home had gone up 10 pounds. CXR with pulmonary edema. Pertinent admission labs Included: BNP 490, creatinine 1.3, K 3.6, HS Trop 56>58, and Hgb A1C 6. Started on IV lasix. Weight has come down 4 pounds.   Remains short of breath with exertion.   Review of Systems: [y] = yes, [ ]  = no   General: Weight gain [ Y]; Weight loss [ ] ; Anorexia [ ] ; Fatigue [Y ]; Fever [ ] ; Chills [ ] ; Weakness [ Y]  Cardiac: Chest pain/pressure [ ] ; Resting SOB [ ] ; Exertional SOB [ Y]; Orthopnea [ Y]; Pedal Edema  [ Y]; Palpitations [ ] ; Syncope [ ] ; Presyncope [ ] ; Paroxysmal nocturnal dyspnea[ ]   Pulmonary: Cough [ ] ; Wheezing[ ] ; Hemoptysis[ ] ; Sputum [ ] ; Snoring [ ]   GI: Vomiting[ ] ; Dysphagia[ ] ; Melena[ ] ; Hematochezia [ ] ; Heartburn[ ] ; Abdominal pain [ ] ; Constipation [ ] ; Diarrhea [ ] ; BRBPR [ ]   GU: Hematuria[ ] ; Dysuria [ ] ; Nocturia[ ]   Vascular: Pain in legs with walking [ ] ; Pain in feet with lying flat [ ] ; Non-healing sores [ ] ; Stroke [ ] ; TIA [ ] ; Slurred speech [ ] ;  Neuro: Headaches[ ] ; Vertigo[ ] ; Seizures[ ] ; Paresthesias[ ] ;Blurred vision [ ] ; Diplopia [ ] ; Vision changes [ ]   Ortho/Skin: Arthritis [ ] ; Joint pain [ Y]; Muscle pain [ ] ; Joint swelling [ ] ; Back Pain [ ] Y; Rash [ ]   Psych: Depression[ ] ; Anxiety[ ]   Heme: Bleeding problems [ ] ; Clotting disorders [ ] ; Anemia [ ]   Endocrine: Diabetes [ Y]; Thyroid dysfunction[ Y]  Home Medications Prior to Admission medications   Medication Sig Start Date End Date Taking? Authorizing Provider  acetaminophen (TYLENOL) 500 MG tablet Take 500 mg by mouth every 6 (six) hours as needed for fever or headache (pain).   Yes [provider]  allopurinol (ZYLOPRIM) 300 MG tablet Take 300 mg by  mouth daily.   Yes [provider]  Biotin 1000 MCG tablet Take 500 mcg by mouth daily with lunch.    Yes [provider]  Calcium Citrate-Vitamin D (CITRACAL/VITAMIN D PO) Take 2 tablets by mouth every evening.   Yes [provider]  citalopram (CELEXA) 20 MG tablet Take 20 mg by mouth at bedtime.   Yes [provider]  insulin aspart (NOVOLOG) 100 UNIT/ML injection Inject 2 Units into the skin 3 (three) times daily with meals. 09/04/14  Yes Velvet Bathe, MD  insulin detemir (LEVEMIR) 100 UNIT/ML FlexPen Inject 18 Units into the skin daily before breakfast.   Yes [provider]  isosorbide mononitrate (IMDUR) 60 MG 24 hr tablet Take 1 tablet (60 mg total) by mouth daily. 01/01/19 09/09/21 Yes [place]  Donnamarie Poag, DO  levothyroxine (SYNTHROID) 75 MCG tablet Take 75 mcg by mouth daily before breakfast.    Yes [provider]  losartan (COZAAR) 50 MG tablet Take 50 mg by mouth daily. 06/18/21  Yes [provider]  Melatonin 5 MG TABS Take 10 mg by mouth at bedtime.    Yes [provider]  Multiple Vitamin (MULITIVITAMIN WITH MINERALS) TABS Take 1 tablet by mouth daily.   Yes [provider]  Omega-3 Fatty Acids (FISH OIL) 1000 MG CAPS Take 1,000 mg by mouth daily.    Yes [provider]  rosuvastatin (CRESTOR) 10 MG tablet Take 1 tablet (10 mg total) by mouth daily. 05/09/16  Yes Rogue Bussing, MD  torsemide (DEMADEX) 20 MG tablet TAKE 3 TABLETS EVERY MORNING AND 2 TABLETS EVERY EVENING. CAN TAKE 3 TABS TWICE DAILY ONLY AS NEEDED Patient taking differently: See admin instructions. Takes 3 tablets every morning and 2 tablets every evening, can take 3 tabs twice daily as needed 11/15/20  Yes Bensimhon, Shaune Pascal, MD  warfarin (COUMADIN) 2.5 MG tablet TAKE 7.5 MG ON M-W-F AND 5 MG TUES, THURS, SAT AND SUN AS DIRECTED Patient taking differently: Take 5 mg by mouth daily. Pt taking 5 mg six days per week, and 7 mg on the seventh day. 02/10/16  Yes Livesay, Lennis P, MD  potassium chloride SA (KLOR-CON M20) 20 MEQ tablet Take 2 tablets (40 mEq total) by mouth daily. Absolute last refill without office visit please call [phone] to schedule Patient not taking: Reported on 09/09/2021 07/05/21   Bensimhon, Shaune Pascal, MD  Semaglutide,0.25 or 0.5MG /DOS, (OZEMPIC, 0.25 OR 0.5 MG/DOSE,) 2 MG/1.5ML SOPN Inject 0.25 mg into the skin once a week.    [provider]    Past Medical History: Past Medical History:  Diagnosis Date   A-fib ([place])    Anemia    Anticoagulated on Coumadin, chronically 09/03/2011   Anxiety    Arthritis    "right hip; both knees; left wrist/shoulder; back" (01/19/2013"   Bleeding on Coumadin 08/2012; 01/18/2013   BRBPR admissions  (01/19/2013)   CHF (congestive heart failure) ([place])    "2-3 times" (01/19/2013)   Chronic lower back pain    Depression    DVT (deep venous thrombosis) (HCC) 10 years ago   numerous/notes 01/18/2013   GERD (gastroesophageal reflux disease)    Gout    Headache(784.0)    "maybe weekly" (01/19/2013)   Heart murmur    High cholesterol    "been off RX for this at one time" (01/18/2013)   History of blood transfusion 1983; 04/2012   "3 w/ childbirth; hospitalized for pain" (01/19/2013)   Hypertension    Hypothyroidism  Migraines    "twice/yr maybe" (01/19/2013)   Obstructive sleep apnea 05/03/2012   OSA (obstructive sleep apnea)    "sent me for test in 04/2012; never ordered mask, etc" (01/19/2013)   PE (pulmonary thromboembolism) ([place]) 3 years ag0   3/notes 01/18/2013   Pneumonia before 2011   "once' (01/18/2013)   Renal disorder    kindey function low; "Metformin was destroying my kidneys" (01/19/2013)   Shortness of breath    "only related to my CHF" (01/18/2013)   Swelling of hand 08/31/2014   RT HAND   Type II diabetes mellitus ([place])    UTI (urinary tract infection) 08/31/2014    Past Surgical History: Past Surgical History:  Procedure Laterality Date   CARDIAC CATHETERIZATION N/A 03/13/2015   Procedure: Right/Left Heart Cath and Coronary Angiography;  Surgeon: Adrian Prows, MD;  Location: [facility];  Service: Cardiovascular;  Laterality: N/A;   CATARACT EXTRACTION W/ INTRAOCULAR LENS  IMPLANT, BILATERAL Bilateral 2006-2011   CESAREAN SECTION  1983   CHOLECYSTECTOMY  ~ 2002   COLONOSCOPY N/A 01/21/2013   Procedure: COLONOSCOPY;  Surgeon: Beryle Beams, MD;  Location: [facility];  Service: Endoscopy;  Laterality: N/A;   EYE SURGERY Bilateral    "multiple" (01/18/2013)   PACEMAKER IMPLANT N/A 08/31/2018   Symptomatic bradycardia due to mobitz II second degree AV block, permanent afib/ atypical atrial flutter implanted by Dr Rayann Heman   PARS PLANA REPAIR OF RETINAL  DEATACHMENT Right    PARS PLANA VITRECTOMY Bilateral 2004-2006   "several" (01/18/2013)   REFRACTIVE SURGERY Bilateral    "for stigmatism" (01/18/2013)   REFRACTIVE SURGERY Left ~ 11/2012   "to puff it up cause vision got hazy" (01/18/2013)   RIGHT HEART CATH N/A 12/30/2018   Procedure: RIGHT HEART CATH;  Surgeon: Jolaine Artist, MD;  Location: [facility];  Service: Cardiovascular;  Laterality: N/A;   VENA CAVA FILTER PLACEMENT  2011?    Family History: Family History  Problem Relation Age of Onset   Cerebral aneurysm Mother    Hypertension Father    Cerebral aneurysm Maternal Grandfather    Cerebral aneurysm Maternal Aunt    Cancer Maternal Uncle     Social History: Social History   Socioeconomic History   Marital status: Single    Spouse name: Not on file   Number of children: Not on file   Years of education: Not on file   Highest education level: Not on file  Occupational History   Occupation: disabled  Tobacco Use   Smoking status: Former    Packs/day: 0.05    Years: 30.00    Pack years: 1.50    Types: Cigarettes    Quit date: 1990    Years since quitting: 33.4   Smokeless tobacco: Never   Tobacco comments:    01/19/2013 "quit smoking cigarettes in the early '90's"  Substance and Sexual Activity   Alcohol use: Not Currently    Comment: rare now but never a heavy drinker   Drug use: No   Sexual activity: Never  Other Topics Concern   Not on file  Social History Narrative   Not on file   Social Determinants of Health   Financial Resource Strain: Not on file  Food Insecurity: Not on file  Transportation Needs: Not on file  Physical Activity: Not on file  Stress: Not on file  Social Connections: Not on file    Allergies:  Allergies  Allergen Reactions   Morphine And Related Rash  Objective:    Vital Signs:   Temp:  [97.9 F (36.6 C)-98.3 F (36.8 C)] 97.9 F (36.6 C) (06/06 0326) Pulse Rate:  [56-98] 64 (06/06 1137) Resp:   [14-22] 16 (06/06 1120) BP: (131-188)/(61-93) 159/77 (06/06 1137) SpO2:  [92 %-99 %] 98 % (06/06 1137) Weight:  [121 kg] 121 kg (06/06 0326) Last BM Date : 09/10/21  Weight change: Filed Weights   09/08/21 1933 09/10/21 0326  Weight: 123.4 kg 121 kg    Intake/Output:   Intake/Output Summary (Last 24 hours) at 09/10/2021 1214 Last data filed at 09/10/2021 1102 Gross per 24 hour  Intake 590 ml  Output 1350 ml  Net -760 ml      Physical Exam    General:  No resp difficulty HEENT: normal Neck: supple. JVP to jaw . Carotids 2+ bilat; no bruits. No lymphadenopathy or thyromegaly appreciated. Cor: PMI nondisplaced. Regular rate & rhythm. No rubs, gallops or murmurs. Lungs: clear Abdomen: soft, nontender, nondistended. No hepatosplenomegaly. No bruits or masses. Good bowel sounds. Extremities: no cyanosis, clubbing, rash, R and LLE 1-2+ edema Neuro: alert & orientedx3, cranial nerves grossly intact. moves all 4 extremities w/o difficulty. Affect pleasant   Telemetry   A sensed V paced   EKG     Labs   Basic Metabolic Panel: Recent Labs  Lab 09/08/21 1944 09/09/21 0435 09/10/21 0452  NA 143 144 142  K 3.6 3.2* 3.9  CL 106 106 105  CO2 29 30 31   GLUCOSE 188* 147* 98  BUN 32* 32* 32*  CREATININE 1.33* 1.26* 1.26*  CALCIUM 8.9 8.9 9.1  MG  --  1.8  --     Liver Function Tests: Recent Labs  Lab 09/08/21 1944 09/09/21 0435  AST 27 24  ALT 28 25  ALKPHOS 72 62  BILITOT 0.5 1.2  PROT 6.0* 6.0*  ALBUMIN 3.4* 3.4*   No results for input(s): LIPASE, AMYLASE in the last 168 hours. No results for input(s): AMMONIA in the last 168 hours.  CBC: Recent Labs  Lab 09/08/21 1944 09/09/21 0435  WBC 5.5 5.8  NEUTROABS  --  4.1  HGB 10.4* 10.2*  HCT 33.0* 32.4*  MCV 106.5* 104.5*  PLT 141* 136*    Cardiac Enzymes: No results for input(s): CKTOTAL, CKMB, CKMBINDEX, TROPONINI in the last 168 hours.  BNP: BNP (last 3 results) Recent Labs    09/08/21 1944   BNP 490.2*    ProBNP (last 3 results) No results for input(s): PROBNP in the last 8760 hours.   CBG: Recent Labs  Lab 09/09/21 1108 09/09/21 1638 09/09/21 2145 09/10/21 0557 09/10/21 1158  GLUCAP 104* 86 109* 103* 181*    Coagulation Studies: Recent Labs    09/08/21 2216 09/09/21 0435 09/10/21 0452  LABPROT 29.6* 30.0* 27.9*  INR 2.8* 2.9* 2.6*     Imaging   DG CHEST PORT 1 VIEW  Result Date: 09/10/2021 CLINICAL DATA:  Chest pain radiating to the left side in left arm EXAM: PORTABLE CHEST 1 VIEW COMPARISON:  09/08/2021 FINDINGS: Single lead pacer remains in place. The patient is rotated to the left on today's radiograph, reducing diagnostic sensitivity and specificity. Very prominent left pulmonary artery suspicious for pulmonary arterial hypertension. Atherosclerotic calcification of the aortic arch. Mild enlargement of the cardiopericardial silhouette, without edema. IMPRESSION: 1. Very prominent main pulmonary artery favoring pulmonary arterial hypertension. 2.  Aortic Atherosclerosis (ICD10-I70.0). 3. Mild enlargement of the cardiopericardial silhouette, without overt edema. 4. Single lead pacer remains in place. 5.  The patient is rotated to the left on today's radiograph, reducing diagnostic sensitivity and specificity. Electronically Signed   By: Van Clines M.D.   On: 09/10/2021 11:59     Medications:     Current Medications:  allopurinol  300 mg Oral Daily   citalopram  20 mg Oral QHS   furosemide  80 mg Intravenous BID   insulin aspart  0-15 Units Subcutaneous TID AC & HS   insulin aspart  8 Units Subcutaneous TID WC   insulin detemir  18 Units Subcutaneous Daily   isosorbide mononitrate  60 mg Oral Daily   levothyroxine  75 mcg Oral QAC breakfast   losartan  50 mg Oral Daily   melatonin  10 mg Oral QHS   multivitamin with minerals  1 tablet Oral Daily   potassium chloride  40 mEq Oral BID   rosuvastatin  10 mg Oral Daily   warfarin  5 mg Oral  ONCE-1600   Warfarin - Pharmacist Dosing Inpatient   Does not apply q1600    Infusions:     Patient Profile  [name] is a 66 year old with a history of obesity, HTN, DM, atrial fib, DVT, PE x3 , s/p IVC filter placed in 2013, CHB s/p MDT single chamber PPM 08/2018, OSA, and chronic diastolic heart failure.   Admitted with A/C HFpEF    Assessment/Plan  1. A/C HFpEF ECHO 12/2017 EF 65-70% , moderately increased LVH. [place] 12/2018 with preserved cardiac output and low filling pressures. PYP scan not convincing for amyloid.  Echo this admit EF 60-65% RV normal .  Admitted with volume overload in the setting of high sodium diet. CXR with pulmonary edema. BNP mid 400s.  Continue to diurese with 80 mg IV lasix bid and give 2.5 mg metolazone. Once diuresed will need torsemide 60 mg twice a day.  Add 12.5 mg spironolactone.  Discussed low salt food choices and limiting fluid intake.  Consult dietitian.  2. CKD Stage IIIa Creatinine baseline ~1.5-1.7 Renal function remains stable. Creatinine 1.26 today.   3. H/O CHB with MDT PPM  4. DMII Hgb A1C 6.  On SSI   5. Hypothyroidism  On levothyroxine. Per primary team.   6. H/O PEs - On coumadin   7. OSA   8. Atrial fibrillation: Chronic, was in AF in 2021.   Length of Stay: 0  Darrick Grinder, NP  09/10/2021, 12:14 PM  Advanced Heart Failure Team Pager [phone] (M-F; 7a - 5p)  Please contact [facility] for night-coverage after hours (4p -7a ) and weekends on [email or web address]  Patient seen with NP, agree with the above note.   Patient has history of chronic diastolic CHF and PEs, was admitted with volume overload.  Echo this admission showed EF 55-60%, moderate LVH,  normal RV.    She is in atrial fibrillation.   She is diuresing with IV Lasix, still with dyspnea and still feels "swollen."    General: NAD Neck: JVP 14 cm, no thyromegaly or thyroid nodule.  Lungs: Clear to auscultation bilaterally with normal respiratory  effort. CV: Nondisplaced PMI.  Heart irregular S1/S2, no S3/S4, no murmur.  1+ ankle edema.  Abdomen: Soft, nontender, no hepatosplenomegaly, no distention.  Skin: Intact without lesions or rashes.  Neurologic: Alert and oriented x 3.  Psych: Normal affect. Extremities: No clubbing or cyanosis.  HEENT: Normal.   She is volume overloaded on exam today still.  Echo stable.  Creatinine lower with diuresis so far, 1.26.  -  Continue Lasix 80 mg IV bid.  - Will give dose of metolazone 2.5 x 1.   She is in rate-controlled atrial fibrillation.  She was in AF back in 2021.  This may be chronic/permanent.    Loralie Champagne 09/10/2021 1:45 PM

## 2021-09-10 NOTE — Progress Notes (Signed)
Heart Failure Navigator Progress Note  Assessed for Heart & Vascular TOC clinic readiness.  Patient does not meet criteria due to seen with Heart failure team.     Earnestine Leys, BSN, RN Heart Failure Nurse Navigator Secure Chat Only

## 2021-09-10 NOTE — Care Management Obs Status (Signed)
[facility] NOTIFICATION   Patient Details  Name: ABILENE MCPHEE MRN: [id number] Date of Birth: [birth year]   Medicare Observation Status Notification Given:  Yes    Zenon Mayo, RN 09/10/2021, 10:12 AM

## 2021-09-10 NOTE — Progress Notes (Signed)
OT Cancellation Note  Patient Details Name: WILSON SAMPLE MRN: [id number] DOB: 02/22/1956   Cancelled Treatment:    Reason Eval/Treat Not Completed: OT screened, no needs identified, will sign off OT reviewed status and recommendations with patient complete brief assessment completed. No OT needs or indication for evaluation at this time.   Verenise Moulin OTR/L acute rehab services Office: [place] 09/10/2021, 10:10 AM

## 2021-09-10 NOTE — Progress Notes (Signed)
ANTICOAGULATION CONSULT NOTE - Initial Consult  Pharmacy Consult for Warfarin  Indication: Afib, History of DVT/PE  Allergies  Allergen Reactions   Morphine And Related Rash    Patient Measurements: Height: 5\' 3"  (160 cm) Weight: 121 kg (266 lb 12.8 oz) IBW/kg (Calculated) : 52.4  Vital Signs: Temp: 97.9 F (36.6 C) (06/06 0326) Temp Source: Oral (06/06 0326) BP: 149/84 (06/06 0326) Pulse Rate: 65 (06/06 0326)  Labs: Recent Labs    09/08/21 1944 09/08/21 2216 09/09/21 0040 09/09/21 0435 09/10/21 0452  HGB 10.4*  --   --  10.2*  --   HCT 33.0*  --   --  32.4*  --   PLT 141*  --   --  136*  --   LABPROT  --  29.6*  --  30.0* 27.9*  INR  --  2.8*  --  2.9* 2.6*  CREATININE 1.33*  --   --  1.26* 1.26*  TROPONINIHS  --  56* 58*  --   --      Estimated Creatinine Clearance: 56.1 mL/min (A) (by C-G formula based on SCr of 1.26 mg/dL (H)).   Medical History: Past Medical History:  Diagnosis Date   A-fib ([place])    Anemia    Anticoagulated on Coumadin, chronically 09/03/2011   Anxiety    Arthritis    "right hip; both knees; left wrist/shoulder; back" (01/19/2013"   Bleeding on Coumadin 08/2012; 01/18/2013   BRBPR admissions (01/19/2013)   CHF (congestive heart failure) ([place])    "2-3 times" (01/19/2013)   Chronic lower back pain    Depression    DVT (deep venous thrombosis) (HCC) 10 years ago   numerous/notes 01/18/2013   GERD (gastroesophageal reflux disease)    Gout    Headache(784.0)    "maybe weekly" (01/19/2013)   Heart murmur    High cholesterol    "been off RX for this at one time" (01/18/2013)   History of blood transfusion 1983; 04/2012   "3 w/ childbirth; hospitalized for pain" (01/19/2013)   Hypertension    Hypothyroidism    Migraines    "twice/yr maybe" (01/19/2013)   Obstructive sleep apnea 05/03/2012   OSA (obstructive sleep apnea)    "sent me for test in 04/2012; never ordered mask, etc" (01/19/2013)   PE (pulmonary thromboembolism) ([place]) 3  years ag0   3/notes 01/18/2013   Pneumonia before 2011   "once' (01/18/2013)   Renal disorder    kindey function low; "Metformin was destroying my kidneys" (01/19/2013)   Shortness of breath    "only related to my CHF" (01/18/2013)   Swelling of hand 08/31/2014   RT HAND   Type II diabetes mellitus ([place])    UTI (urinary tract infection) 08/31/2014      Assessment: 66 y/o F presents to the ED with shortness of breath, on warfarin PTA for afib and history of DVT/PE. PTA warfarin dose listed at 5mg  daily six days a week and 7 mg the other day of the week (patient also states 5mg  daily all week,  so unsure of current dosing).   INR 2.6 today  Goal of Therapy:  INR 2-3 Monitor platelets by anticoagulation protocol: Yes   Plan:  Warfarin 5 mg PO x 1 today at 1600 Daily PT/INR Monitor for bleeding  Mallerie Blok A. Levada Dy, PharmD, BCPS, FNKF Clinical Pharmacist [facility] Please utilize Amion for appropriate phone number to reach the unit pharmacist ([place])

## 2021-09-10 NOTE — Progress Notes (Signed)
Orthopedic Tech Progress Note Patient Details:  [name] [birth year] [id number]  Ortho Devices Type of Ortho Device: Haematologist Ortho Device/Splint Location: BLE Ortho Device/Splint Interventions: Ordered, Application, Adjustment   Post Interventions Patient Tolerated: Well Instructions Provided: [facility] [name] 09/10/2021, 4:02 PM

## 2021-09-10 NOTE — Progress Notes (Signed)
Remote pacemaker transmission.   

## 2021-09-10 NOTE — Evaluation (Signed)
Physical Therapy Evaluation & Discharge Patient Details Name: [name] MRN: [id number] DOB: [birth year] Today's Date: 09/10/2021  History of Present Illness  Pt is a 66 y.o. female admitted 09/08/21 with worsening SOB, peripheral edema. Workup for CHF exacerbation. Pt also with L shin venous stasis ulcer. PMH includes CKD 3b, HTN, HLD, DM2, COPD, CHF, PAF on Coumadin, CHB s/p pacemaker (2020), PE/DVT, OSA.   Clinical Impression  Patient evaluated by Physical Therapy with no further acute PT needs identified. PTA, pt mod indep with use of SPC for community ambulation, drives, does not work; pt lives with roommate. Today, pt mod indep ambulating with SPC; endorses improvement in DOE. Educ re: activity recommendations, energy conservation strategies. All education has been completed and the patient has no further questions. Acute PT is signing off. Thank you for this referral.    SpO2 >/95% on RA HR 81-96    Recommendations for follow up therapy are one component of a multi-disciplinary discharge planning process, led by the attending physician.  Recommendations may be updated based on patient status, additional functional criteria and insurance authorization.  Follow Up Recommendations No PT follow up    Assistance Recommended at Discharge PRN  Patient can return home with the following   N/A    Equipment Recommendations None recommended by PT  Recommendations for Other Services       Functional Status Assessment Patient has not had a recent decline in their functional status     Precautions / Restrictions Precautions Precautions: None Restrictions Weight Bearing Restrictions: No      Mobility  Bed Mobility Overal bed mobility: Modified Independent             General bed mobility comments: HOB elevated    Transfers Overall transfer level: Independent Equipment used: None                    Ambulation/Gait Ambulation/Gait assistance: Modified  independent (Device/Increase time) Gait Distance (Feet): 360 Feet Assistive device: None, Straight cane Gait Pattern/deviations: Step-through pattern, Decreased stride length, Wide base of support Gait velocity: Decreased     General Gait Details: slow gait to/from bathroom without DME, intermittent holding onto furniture/doorway for added stability; mod indep hallway ambulator with SPC and intermittent rail support for other UE; pt notes significant improvement in DOE  Stairs            Wheelchair Mobility    Modified Rankin (Stroke Patients Only)       Balance Overall balance assessment: Modified Independent   Sitting balance-[name]: Good Sitting balance - Comments: indep with pericare sitting on toilet;     Standing balance-[name]: Fair Standing balance comment: performing ADL tasks at sink without UE support; preference for [facility] to ambulate                             Pertinent Vitals/Pain Pain Assessment Pain Assessment: No/denies pain    Home Living Family/patient expects to be discharged to:: Private residence Living Arrangements: Non-relatives/Friends (roommate) Available Help at Discharge: Family;Available 24 hours/day Type of Home: House Home Access: Stairs to enter Entrance Stairs-Rails: Right Entrance Stairs-Number of Steps: 3   Home Layout: One level Home Equipment: Rollator (4 wheels);Cane - single point;Grab bars - tub/shower      Prior Function Prior Level of Function : Independent/Modified Independent             Mobility Comments: Mod indep household ambulator occasionally  holding onto furniture; use of SPC on stairs ADLs Comments: mod indep with household tasks; 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, structural        Extremity/Trunk Assessment   Upper Extremity Assessment Upper Extremity Assessment: Overall WFL for tasks assessed    Lower Extremity Assessment Lower Extremity Assessment: Overall WFL for tasks  assessed (noted lower leg swelling/redness; L lower leg wound/bruising)       Communication   Communication: No difficulties  Cognition Arousal/Alertness: Awake/alert Behavior During Therapy: WFL for tasks assessed/performed Overall Cognitive Status: Within Functional Limits for tasks assessed                                          General Comments General comments (skin integrity, edema, etc.): SpO2 >/95% on RA, HR up to 96. educ on activity recommendations (walking program) and energy conservation strategies, handout provided. pt reports no questions or concerns; declines need for OT eval    Exercises     Assessment/Plan    PT Assessment Patient does not need any further PT services  PT Problem List         PT Treatment Interventions      PT Goals (Current goals can be found in the Care Plan section)  Acute Rehab PT Goals PT Goal Formulation: All assessment and education complete, DC therapy    Frequency       Co-evaluation               AM-PAC PT "6 Clicks" Mobility  Outcome Measure Help needed turning from your back to your side while in a flat bed without using bedrails?: None Help needed moving from lying on your back to sitting on the side of a flat bed without using bedrails?: None Help needed moving to and from a bed to a chair (including a wheelchair)?: None Help needed standing up from a chair using your arms (e.g., wheelchair or bedside chair)?: None Help needed to walk in hospital room?: None Help needed climbing 3-5 steps with a railing? : None 6 Click Score: 24    End of Session   Activity Tolerance: Patient tolerated treatment well Patient left: in bed;with call bell/phone within reach Nurse Communication: Mobility status PT Visit Diagnosis: Other abnormalities of gait and mobility (R26.89)    Time: 7322-0254 PT Time Calculation (min) (ACUTE ONLY): 22 min   Charges:   PT Evaluation $PT Eval Low Complexity: Oliver, PT, DPT Acute Rehabilitation Services  Pager [phone] Office [place] 09/10/2021, 8:41 AM

## 2021-09-10 NOTE — Progress Notes (Signed)
Pt states she was feeling chest tightness/SOB/coughing and wheezy after using restroom. DUO PRN was given. Pt states she feels better. RN and MD aware. RT will continue to monitor for any changes.

## 2021-09-10 NOTE — Progress Notes (Signed)
Progress Note  Patient: [name] BSJ:[id number] DOB: 01-08-1956  DOA: 09/08/2021  DOS: 09/10/2021    Brief hospital course: [name] is a 66 y.o. female with a history of chronic HfpEF, CHB s/p PPM, DVT and PE on coumadin, PAF, HTN, HLD, T2DM, and morbid obesity who presented to the ED 6/4 with a wek of weight gain, leg swelling, and dyspnea as well as a sense of chest heaviness. She appeared volume overloaded on exam with CXR showing pulmonary edema, BNP 490, troponin 56 > 58. IV lasix was started, heart failure team consulted and the patient was admitted for acute on chronic HFpEF.   Assessment and Plan: Acute on chronic HFpEF: Likely precipitated by dietary indiscretion. Updated echo is stable, LVEF 55-60%, mod LVH, normal RV. - Continue IV lasix as ordered. Appreciate HF team involvement, seen by Dr. Haroldine Laws back in 2021 and lost to follow up since. Adding metolazone. Plan seems to be increasing torsemide home dose to 60mg  po BID at discharge.  - Dietitian consulted  - Continue daily weights (123 > 121kg; below 124-127kg noted in 2021), I/O (down 3L thus far).  - Low dose spironolactone added.    Demand myocardial ischemia, chest pain: Troponin mildly elevated and flat. No WMA on echo.  - Defer to cardiology regarding need for further work up.   Chronic atrial fibrillation: Noted to be in AFib in 2021. Rate-controlled. - Continue coumadin as below  Recurrent DVT/PE:  - Continue coumadin.    Stage IIIa CKD: Presumed based on available Cr data. Currently stable.  - Monitor with diuresis.   Hypokalemia:  - Resolved, continue monitoring, will supplement with IV diuresis, give Mg as well w/AFib.  - Starting spironolactone.    T2DM: Well-controlled with HbA1c 6%.  - levemir + 8u TIDWC + mod SSI, at inpatient goal.   HTN:  - ARB, imdur  Hypothyroidism:  - Continue synthroid at home dose. Check TSH (last checked in 2020).   Hyperlipidemia:  - Continue rosuvastatin     Wound of left leg Lesion on anterior surface of the left lower extremity consistent with a ruptured blister that has sloughed off -no evidence of infection at time of exam today   Macrocytic anemia: T65 and folic acid levels wnl.    OSA: Intolerant of CPAP.  Morbid obesity: Body mass index is 47.26 kg/m.  - Weight loss recommended.   Subjective: Still with some left chest heaviness, shortness of breath. Denies she has COPD but had felt like she was wheezing. Leg swelling still worse than baseline.   Objective: Vitals:   09/10/21 1104 09/10/21 1120 09/10/21 1137 09/10/21 1529  BP: (!) 141/72 (!) 141/72 (!) 159/77 (!) 118/55  Pulse: 61 66 64 63  Resp: 14 16  18   Temp: 97.7 F (36.5 C)   97.9 F (36.6 C)  TempSrc: Oral   Oral  SpO2: 98% 96% 98% 95%  Weight:      Height:       Gen: Pleasant, obese 66 y.o. female in no distress Pulm: Nonlabored breathing, no wheezes or crackles. CV: Irreg irreg without murmur, rub, or gallop. 1+ dependent edema. GI: Abdomen soft, non-tender, non-distended, with normoactive bowel sounds.  Ext: Warm, no deformities Skin: No rashes, lesions or ulcers on visualized skin. Neuro: Alert and oriented. No focal neurological deficits. Psych: Judgement and insight appear fair. Mood euthymic & affect congruent. Behavior is appropriate.    Data Personally reviewed:  CBC: Recent Labs  Lab 09/08/21 1944 09/09/21  0435  WBC 5.5 5.8  NEUTROABS  --  4.1  HGB 10.4* 10.2*  HCT 33.0* 32.4*  MCV 106.5* 104.5*  PLT 141* 270*   Basic Metabolic Panel: Recent Labs  Lab 09/08/21 1944 09/09/21 0435 09/10/21 0452  NA 143 144 142  K 3.6 3.2* 3.9  CL 106 106 105  CO2 29 30 31   GLUCOSE 188* 147* 98  BUN 32* 32* 32*  CREATININE 1.33* 1.26* 1.26*  CALCIUM 8.9 8.9 9.1  MG  --  1.8  --    GFR: Estimated Creatinine Clearance: 56.1 mL/min (A) (by C-G formula based on SCr of 1.26 mg/dL (H)). Liver Function Tests: Recent Labs  Lab 09/08/21 1944  09/09/21 0435  AST 27 24  ALT 28 25  ALKPHOS 72 62  BILITOT 0.5 1.2  PROT 6.0* 6.0*  ALBUMIN 3.4* 3.4*   No results for input(s): LIPASE, AMYLASE in the last 168 hours. No results for input(s): AMMONIA in the last 168 hours. Coagulation Profile: Recent Labs  Lab 09/08/21 2216 09/09/21 0435 09/10/21 0452  INR 2.8* 2.9* 2.6*   Cardiac Enzymes: No results for input(s): CKTOTAL, CKMB, CKMBINDEX, TROPONINI in the last 168 hours. BNP (last 3 results) No results for input(s): PROBNP in the last 8760 hours. HbA1C: Recent Labs    09/09/21 0435  HGBA1C 6.0*   CBG: Recent Labs  Lab 09/09/21 1638 09/09/21 2145 09/10/21 0557 09/10/21 1158 09/10/21 1527  GLUCAP 86 109* 103* 181* 118*   Lipid Profile: No results for input(s): CHOL, HDL, LDLCALC, TRIG, CHOLHDL, LDLDIRECT in the last 72 hours. Thyroid Function Tests: No results for input(s): TSH, T4TOTAL, FREET4, T3FREE, THYROIDAB in the last 72 hours. Anemia Panel: Recent Labs    09/10/21 0452  VITAMINB12 584  FOLATE 25.7   Urine analysis:    Component Value Date/Time   COLORURINE YELLOW 12/26/2018 1343   APPEARANCEUR HAZY (A) 12/26/2018 1343   LABSPEC 1.011 12/26/2018 1343   PHURINE 8.0 12/26/2018 1343   GLUCOSEU NEGATIVE 12/26/2018 1343   HGBUR NEGATIVE 12/26/2018 1343   BILIRUBINUR NEGATIVE 12/26/2018 1343   KETONESUR NEGATIVE 12/26/2018 1343   PROTEINUR 100 (A) 12/26/2018 1343   UROBILINOGEN 0.2 08/31/2014 1158   NITRITE NEGATIVE 12/26/2018 1343   LEUKOCYTESUR LARGE (A) 12/26/2018 1343   Recent Results (from the past 240 hour(s))  SARS Coronavirus 2 by RT PCR (hospital order, performed in [place] hospital lab) *cepheid single result test* Anterior Nasal Swab     Status: None   Collection Time: 09/09/21 12:12 AM   Specimen: Anterior Nasal Swab  Result Value Ref Range Status   SARS Coronavirus 2 by RT PCR NEGATIVE NEGATIVE Final    Comment: (NOTE) SARS-CoV-2 target nucleic acids are NOT DETECTED.  The  SARS-CoV-2 RNA is generally detectable in upper and lower respiratory specimens during the acute phase of infection. The lowest concentration of SARS-CoV-2 viral copies this assay can detect is 250 copies / mL. A negative result does not preclude SARS-CoV-2 infection and should not be used as the sole basis for treatment or other patient management decisions.  A negative result may occur with improper specimen collection / handling, submission of specimen other than nasopharyngeal swab, presence of viral mutation(s) within the areas targeted by this assay, and inadequate number of viral copies (<250 copies / mL). A negative result must be combined with clinical observations, patient history, and epidemiological information.  Fact Sheet for Patients:   https://www.patel.info/  Fact Sheet for Healthcare Providers: https://hall.com/  This test is  not yet approved or  cleared by the [place] and has been authorized for detection and/or diagnosis of SARS-CoV-2 by FDA under an Emergency Use Authorization (EUA).  This EUA will remain in effect (meaning this test can be used) for the duration of the COVID-19 declaration under Section 564(b)(1) of the Act, 21 U.S.C. section 360bbb-3(b)(1), unless the authorization is terminated or revoked sooner.  Performed at [facility], [place] [address] [place], [place] [address]      DG Chest 2 View  Result Date: 09/08/2021 CLINICAL DATA:  sob EXAM: CHEST - 2 VIEW COMPARISON:  December 26, 2018 FINDINGS: The cardiomediastinal silhouette is unchanged and enlarged in contour.Atherosclerotic calcifications. LEFT chest cardiac pacing device. No pleural effusion. No pneumothorax. Perihilar vascular fullness, bronchial wall cuffing and diffuse interstitial prominence. Visualized abdomen is unremarkable. Multilevel degenerative changes of the thoracic spine. IMPRESSION: Constellation of findings are favored  to reflect pulmonary edema. Electronically Signed   By: Valentino Saxon M.D.   On: 09/08/2021 20:35   DG CHEST PORT 1 VIEW  Result Date: 09/10/2021 CLINICAL DATA:  Chest pain radiating to the left side in left arm EXAM: PORTABLE CHEST 1 VIEW COMPARISON:  09/08/2021 FINDINGS: Single lead pacer remains in place. The patient is rotated to the left on today's radiograph, reducing diagnostic sensitivity and specificity. Very prominent left pulmonary artery suspicious for pulmonary arterial hypertension. Atherosclerotic calcification of the aortic arch. Mild enlargement of the cardiopericardial silhouette, without edema. IMPRESSION: 1. Very prominent main pulmonary artery favoring pulmonary arterial hypertension. 2.  Aortic Atherosclerosis (ICD10-I70.0). 3. Mild enlargement of the cardiopericardial silhouette, without overt edema. 4. Single lead pacer remains in place. 5. The patient is rotated to the left on today's radiograph, reducing diagnostic sensitivity and specificity. Electronically Signed   By: Van Clines M.D.   On: 09/10/2021 11:59   ECHOCARDIOGRAM COMPLETE  Result Date: 09/09/2021    ECHOCARDIOGRAM REPORT   Patient Name:   [name] Date of Exam: 09/09/2021 Medical Rec #:  [id number]        Height:       63.0 in Accession #:    [id number]       Weight:       272.0 lb Date of Birth:  [birth year]        BSA:          2.204 m Patient Age:    41 years         BP:           152/67 mmHg Patient Gender: F                HR:           73 bpm. Exam Location:  Inpatient Procedure: 2D Echo, Cardiac Doppler and Color Doppler Indications:    CHF-Acute Diastolic O75.64  History:        Patient has prior history of Echocardiogram examinations, most                 recent 12/27/2018. Pacemaker, COPD, Arrythmias:Complete heart                 block, Atrial Fibrillation and LBBB; Risk Factors:Hypertension,                 Diabetes, Dyslipidemia, Sleep Apnea and Former Smoker. GERD.                 Chronic  kidney disease.  Sonographer:    Bernadene Person RDCS  Referring Phys: [id number] [place]  1. Left ventricular ejection fraction, by estimation, is 60 to 65%. The left ventricle has normal function. The left ventricle has no regional wall motion abnormalities. There is moderate concentric left ventricular hypertrophy. Left ventricular diastolic parameters are indeterminate. Elevated left ventricular end-diastolic pressure.  2. Right ventricular systolic function is normal. The right ventricular size is normal. Tricuspid regurgitation signal is inadequate for assessing PA pressure.  3. Left atrial size was mildly dilated.  4. Right atrial size was mildly dilated.  5. The mitral valve is normal in structure. Trivial mitral valve regurgitation. No evidence of mitral stenosis. Moderate to severe mitral annular calcification.  6. The aortic valve is tricuspid. Aortic valve regurgitation is mild to moderate. Aortic valve sclerosis/calcification is present, without any evidence of aortic stenosis.  7. The inferior vena cava is normal in size with greater than 50% respiratory variability, suggesting right atrial pressure of 3 mmHg. FINDINGS  Left Ventricle: Left ventricular ejection fraction, by estimation, is 60 to 65%. The left ventricle has normal function. The left ventricle has no regional wall motion abnormalities. The left ventricular internal cavity size was normal in size. There is  moderate concentric left ventricular hypertrophy. Left ventricular diastolic function could not be evaluated due to mitral annular calcification (moderate or greater). Left ventricular diastolic parameters are indeterminate. Elevated left ventricular end-diastolic pressure. Right Ventricle: The right ventricular size is normal. No increase in right ventricular wall thickness. Right ventricular systolic function is normal. Tricuspid regurgitation signal is inadequate for assessing PA pressure. The tricuspid regurgitant  velocity is 2.49 m/s, and with an assumed right atrial pressure of 3 mmHg, the estimated right ventricular systolic pressure is 95.1 mmHg. Left Atrium: Left atrial size was mildly dilated. Right Atrium: Right atrial size was mildly dilated. Pericardium: There is no evidence of pericardial effusion. Mitral Valve: The mitral valve is normal in structure. Moderate to severe mitral annular calcification. Trivial mitral valve regurgitation. No evidence of mitral valve stenosis. Tricuspid Valve: The tricuspid valve is normal in structure. Tricuspid valve regurgitation is mild . No evidence of tricuspid stenosis. Aortic Valve: The aortic valve is tricuspid. Aortic valve regurgitation is mild to moderate. Aortic regurgitation PHT measures 438 msec. Aortic valve sclerosis/calcification is present, without any evidence of aortic stenosis. Pulmonic Valve: The pulmonic valve was normal in structure. Pulmonic valve regurgitation is not visualized. No evidence of pulmonic stenosis. Aorta: The aortic root is normal in size and structure. Venous: The inferior vena cava is normal in size with greater than 50% respiratory variability, suggesting right atrial pressure of 3 mmHg. IAS/Shunts: No atrial level shunt detected by color flow Doppler. Additional Comments: A device lead is visualized.  LEFT VENTRICLE PLAX 2D LVIDd:         5.10 cm LVIDs:         3.30 cm LV PW:         1.60 cm LV IVS:        1.40 cm LVOT diam:     2.10 cm LV SV:         90 LV SV Index:   41 LVOT Area:     3.46 cm  LV Volumes (MOD) LV vol d, MOD A2C: 121.0 ml LV vol d, MOD A4C: 86.2 ml LV vol s, MOD A2C: 51.7 ml LV vol s, MOD A4C: 32.9 ml LV SV MOD A2C:     69.3 ml LV SV MOD A4C:     86.2 ml LV SV  MOD BP:      63.2 ml RIGHT VENTRICLE RV S prime:     13.60 cm/s TAPSE (M-mode): 1.9 cm LEFT ATRIUM             Index        RIGHT ATRIUM           Index LA diam:        5.00 cm 2.27 cm/m   RA Area:     21.30 cm LA Vol (A2C):   62.8 ml 28.50 ml/m  RA Volume:   60.40  ml  27.41 ml/m LA Vol (A4C):   87.3 ml 39.62 ml/m LA Biplane Vol: 74.9 ml 33.99 ml/m  AORTIC VALVE                   PULMONIC VALVE LVOT Vmax:         127.00 cm/s PR End Diast Vel: 10.37 msec LVOT Vmean:        85.567 cm/s LVOT VTI:          0.261 m AI PHT:            438 msec AR Vena Contracta: 0.30 cm  AORTA Ao Root diam: 3.30 cm Ao Asc diam:  3.50 cm TRICUSPID VALVE TR Peak grad:   24.8 mmHg TR Vmax:        249.00 cm/s  SHUNTS Systemic VTI:  0.26 m Systemic Diam: 2.10 cm Kardie Tobb DO Electronically signed by Berniece Salines DO Signature Date/Time: 09/09/2021/1:49:42 PM    Final     Family Communication: None at bedside  Disposition: Status is: Inpatient Remains inpatient appropriate because: Continues IV diuresis Planned Discharge Destination: Home      Patrecia Pour, MD 09/10/2021 5:00 PM Page by [email or web address]

## 2021-09-10 NOTE — Consult Note (Signed)
WOC Nurse Consult Note: Reason for Consult: LLE wound Patient with longstanding history of venous stasis, she self reports using Unna's boots at home, however she is applying herself.  She has had HHRN in the past. She is not followed by a wound care center. She has compression stockings that she is non compliant with  Wound type: venous stasis ulcer x 2 LLE pretibial  Pressure Injury POA: NA Measurement: 4cm x 3cm x 0.2cm distal wound 1.5cm x 3cm x 0.2cm proximal wound  Wound bed: 100% ruddy, red Drainage (amount, consistency, odor) scant, not purulent  Periwound: severe hemosiderin staining, classic presentation for venous stasis disease.  Dressing procedure/placement/frequency: Cut to fit silver hydrofiber on wound bed, top with silicone foam. Change with Unna's boot changes 2x wk Tu/Friday.  Notified Financial controller and bedside nursing staff of need for topical care prior to contacting orthopedic tech for initial application. Will use Unna's boots bilaterally for wound on the LLE and for swelling on the RLE.   Will need HHRN for 2x wk Unna's boot changes and topical wound care.   Hoping that once compression wraps used for a few weeks, wound will improve and patient can return to needed long term compression stockings.   Discussed POC with patient and bedside nurse.  Re consult if needed, will not follow at this time. Thanks  Vladislav Axelson [employer], RN,CWOCN, CNS, [place] [phone])

## 2021-09-10 NOTE — TOC Progression Note (Addendum)
Transition of Care [facility]) - Progression Note    Patient Details  Name: [name] MRN: [id number] Date of Birth: 1955-08-01  Transition of Care [facility]) CM/SW Contact  Zenon Mayo, RN Phone Number: 09/10/2021, 10:18 AM  Clinical Narrative:    NCM spoike with patient at bedside , offered choice for [facility] for unaboots and CHF disease management, she does not have a preference, NCM made referral to [facility] with [place].  Awaiting to hear back.  She is here with CHF Ex, she is on room air, up ambulating, conts on IV lasix, has wound on LLE.  TOC will continue to follow for dc needs. Marjory Lies with [facility] states they need ABI ordered for the unaboots. NCM informed MD of this information.   Expected Discharge Plan: [place] Barriers to Discharge: Continued Medical Work up  Expected Discharge Plan and Services Expected Discharge Plan: [place]   Discharge Planning Services: CM Consult Post Acute Care Choice: [place] arrangements for the past 2 months: Single Family Home                   DME Agency: NA       HH Arranged: RN [place] Agency: [place] Date [place]: 09/10/21 Time [place]: 1017 Representative spoke with at [place]: [place] ([place]) Interventions    Readmission Risk Interventions     View : No data to display.

## 2021-09-11 ENCOUNTER — Encounter (HOSPITAL_COMMUNITY): Payer: Medicare HMO

## 2021-09-11 ENCOUNTER — Other Ambulatory Visit (HOSPITAL_COMMUNITY): Payer: Self-pay

## 2021-09-11 DIAGNOSIS — I5033 Acute on chronic diastolic (congestive) heart failure: Secondary | ICD-10-CM | POA: Diagnosis not present

## 2021-09-11 LAB — BASIC METABOLIC PANEL
Anion gap: 9 (ref 5–15)
BUN: 35 mg/dL — ABNORMAL HIGH (ref 8–23)
CO2: 32 mmol/L (ref 22–32)
Calcium: 9.2 mg/dL (ref 8.9–10.3)
Chloride: 97 mmol/L — ABNORMAL LOW (ref 98–111)
Creatinine, Ser: 1.43 mg/dL — ABNORMAL HIGH (ref 0.44–1.00)
GFR, Estimated: 41 mL/min — ABNORMAL LOW (ref >60)
Glucose, Bld: 107 mg/dL — ABNORMAL HIGH (ref 70–99)
Potassium: 4.8 mmol/L (ref 3.5–5.1)
Sodium: 138 mmol/L (ref 135–145)

## 2021-09-11 LAB — MAGNESIUM: Magnesium: 2.4 mg/dL (ref 1.7–2.4)

## 2021-09-11 LAB — GLUCOSE, CAPILLARY
Glucose-Capillary: 102 mg/dL — ABNORMAL HIGH (ref 70–99)
Glucose-Capillary: 114 mg/dL — ABNORMAL HIGH (ref 70–99)
Glucose-Capillary: 137 mg/dL — ABNORMAL HIGH (ref 70–99)
Glucose-Capillary: 113 mg/dL — ABNORMAL HIGH (ref 70–99)

## 2021-09-11 LAB — TSH: TSH: 4.53 u[IU]/mL — ABNORMAL HIGH (ref 0.350–4.500)

## 2021-09-11 LAB — PROTIME-INR
INR: 2.6 — ABNORMAL HIGH (ref 0.8–1.2)
Prothrombin Time: 27.4 seconds — ABNORMAL HIGH (ref 11.4–15.2)

## 2021-09-11 MED ORDER — POTASSIUM CHLORIDE CRYS ER 20 MEQ PO TBCR
20.0000 meq | EXTENDED_RELEASE_TABLET | Freq: Every day | ORAL | Status: DC
  Administered 2021-09-11 – 2021-09-12 (×2): 20 meq via ORAL
  Filled 2021-09-11 (×2): qty 1

## 2021-09-11 MED ORDER — ENSURE ENLIVE PO LIQD
237.0000 mL | Freq: Two times a day (BID) | ORAL | Status: DC
  Administered 2021-09-11 – 2021-09-12 (×2): 237 mL via ORAL

## 2021-09-11 MED ORDER — WARFARIN SODIUM 5 MG PO TABS
5.0000 mg | ORAL_TABLET | Freq: Every day | ORAL | Status: DC
  Administered 2021-09-11: 5 mg via ORAL
  Filled 2021-09-11: qty 1

## 2021-09-11 MED ORDER — TORSEMIDE 20 MG PO TABS
60.0000 mg | ORAL_TABLET | Freq: Two times a day (BID) | ORAL | Status: DC
Start: 2021-09-11 — End: 2021-09-12
  Administered 2021-09-11 – 2021-09-12 (×2): 60 mg via ORAL
  Filled 2021-09-11 (×2): qty 3

## 2021-09-11 NOTE — Progress Notes (Signed)
PROGRESS NOTE    [name]  GYI:[id number] DOB: 05/11/1955 DOA: 09/08/2021 PCP: [name], MD  67 y.o. female with a history of chronic HfpEF, CHB s/p PPM, DVT and PE on coumadin, PAF, HTN, HLD, T2DM, and morbid obesity who presented to the ED 6/4 with a wek of weight gain, leg swelling, and dyspnea as well as a sense of chest heaviness. She appeared volume overloaded on exam with CXR showing pulmonary edema, BNP 490, troponin 56 > 58. IV lasix was started, heart failure team consulted and the patient was admitted for acute on chronic HFpEF.   Subjective: -Feels better, breathing almost back to baseline  Assessment and Plan:  Acute on chronic HFpEF:  -Repeat echo with preserved EF, normal RV, moderate LVH -Advanced heart failure team following -Diuresed with IV Lasix, volume status improving, transitioned to torsemide today -Continue low-dose Aldactone, losartan -Discharge planning   Demand myocardial ischemia, chest pain:  -Troponin mildly elevated and flat. No WMA on echo.  -Cards following, no further work-up recommended   Chronic atrial fibrillation:  -Remains in A-fib, rate-controlled. - Continue coumadin    Recurrent DVT/PE:  - Continue coumadin.    CKD stage IIIa -Stable, monitor with diuresis   Hypokalemia:  -Resolved   T2DM:  -Controlled, HbA1c 6%.  - levemir + 8u TIDWC + mod SSI, at inpatient goal.   HTN:  - ARB, imdur   Hypothyroidism:  - Continue synthroid at home dose. Check TSH (last checked in 2020).   Hyperlipidemia:  - Continue rosuvastatin    Wound of left leg Lesion on anterior surface of the left lower extremity consistent with a ruptured blister that has sloughed off -no evidence of infection -Keep wound clean and dry   Macrocytic anemia: J50 and folic acid levels wnl.  -PCP to monitor this   OSA: Intolerant of CPAP.   Morbid obesity: Body mass index is 47.26 kg/m.  - Weight loss recommended.    DVT prophylaxis:  Coumadin Code Status: Full code Family Communication: Discussed with patient detail, no family at bedside Disposition Plan: Home tomorrow if stable  Consultants: Advanced heart failure team   Procedures:   Antimicrobials:    Objective: Vitals:   09/10/21 2033 09/11/21 0345 09/11/21 0900 09/11/21 1058  BP:  (!) 123/48 (!) 133/59 (!) 113/52  Pulse:  60 66 60  Resp:  18 17 17   Temp:  97.8 F (36.6 C) 97.8 F (36.6 C)   TempSrc:  Oral Oral Oral  SpO2: 96% 97% 96% 95%  Weight:  120.2 kg    Height:        Intake/Output Summary (Last 24 hours) at 09/11/2021 1458 Last data filed at 09/11/2021 1304 Gross per 24 hour  Intake 1253 ml  Output 600 ml  Net 653 ml   Filed Weights   09/08/21 1933 09/10/21 0326 09/11/21 0345  Weight: 123.4 kg 121 kg 120.2 kg    Examination:  Chronically ill obese female sitting up in bed, AAOx3, no distress HEENT: Positive JVD CVS: S1-S2, irregularly irregular rhythm Lungs: Clear bilaterally Abdomen: Soft, nontender, bowel sounds present Extremities: No edema, Unna boots noted Psychiatry:  Mood & affect appropriate.     Data Reviewed:   CBC: Recent Labs  Lab 09/08/21 1944 09/09/21 0435  WBC 5.5 5.8  NEUTROABS  --  4.1  HGB 10.4* 10.2*  HCT 33.0* 32.4*  MCV 106.5* 104.5*  PLT 141* 093*   Basic Metabolic Panel: Recent Labs  Lab 09/08/21 1944 09/09/21 0435 09/10/21  1791 09/11/21 0450  NA 143 144 142 138  K 3.6 3.2* 3.9 4.8  CL 106 106 105 97*  CO2 29 30 31  32  GLUCOSE 188* 147* 98 107*  BUN 32* 32* 32* 35*  CREATININE 1.33* 1.26* 1.26* 1.43*  CALCIUM 8.9 8.9 9.1 9.2  MG  --  1.8  --  2.4   GFR: Estimated Creatinine Clearance: 49.2 mL/min (A) (by C-G formula based on SCr of 1.43 mg/dL (H)). Liver Function Tests: Recent Labs  Lab 09/08/21 1944 09/09/21 0435  AST 27 24  ALT 28 25  ALKPHOS 72 62  BILITOT 0.5 1.2  PROT 6.0* 6.0*  ALBUMIN 3.4* 3.4*   No results for input(s): LIPASE, AMYLASE in the last 168 hours. No  results for input(s): AMMONIA in the last 168 hours. Coagulation Profile: Recent Labs  Lab 09/08/21 2216 09/09/21 0435 09/10/21 0452 09/11/21 0450  INR 2.8* 2.9* 2.6* 2.6*   Cardiac Enzymes: No results for input(s): CKTOTAL, CKMB, CKMBINDEX, TROPONINI in the last 168 hours. BNP (last 3 results) No results for input(s): PROBNP in the last 8760 hours. HbA1C: Recent Labs    09/09/21 0435  HGBA1C 6.0*   CBG: Recent Labs  Lab 09/10/21 1158 09/10/21 1527 09/10/21 1957 09/11/21 0617 09/11/21 1055  GLUCAP 181* 118* 76 102* 114*   Lipid Profile: No results for input(s): CHOL, HDL, LDLCALC, TRIG, CHOLHDL, LDLDIRECT in the last 72 hours. Thyroid Function Tests: Recent Labs    09/11/21 0450  TSH 4.530*   Anemia Panel: Recent Labs    09/10/21 0452  VITAMINB12 584  FOLATE 25.7   Urine analysis:    Component Value Date/Time   COLORURINE YELLOW 12/26/2018 1343   APPEARANCEUR HAZY (A) 12/26/2018 1343   LABSPEC 1.011 12/26/2018 1343   PHURINE 8.0 12/26/2018 1343   GLUCOSEU NEGATIVE 12/26/2018 1343   HGBUR NEGATIVE 12/26/2018 1343   BILIRUBINUR NEGATIVE 12/26/2018 1343   KETONESUR NEGATIVE 12/26/2018 1343   PROTEINUR 100 (A) 12/26/2018 1343   UROBILINOGEN 0.2 08/31/2014 1158   NITRITE NEGATIVE 12/26/2018 1343   LEUKOCYTESUR LARGE (A) 12/26/2018 1343   Sepsis Labs: @LABRCNTIP (procalcitonin:4,lacticidven:4)  ) Recent Results (from the past 240 hour(s))  SARS Coronavirus 2 by RT PCR (hospital order, performed in [facility] hospital lab) *cepheid single result test* Anterior Nasal Swab     Status: None   Collection Time: 09/09/21 12:12 AM   Specimen: Anterior Nasal Swab  Result Value Ref Range Status   SARS Coronavirus 2 by RT PCR NEGATIVE NEGATIVE Final    Comment: (NOTE) SARS-CoV-2 target nucleic acids are NOT DETECTED.  The SARS-CoV-2 RNA is generally detectable in upper and lower respiratory specimens during the acute phase of infection. The  lowest concentration of SARS-CoV-2 viral copies this assay can detect is 250 copies / mL. A negative result does not preclude SARS-CoV-2 infection and should not be used as the sole basis for treatment or other patient management decisions.  A negative result may occur with improper specimen collection / handling, submission of specimen other than nasopharyngeal swab, presence of viral mutation(s) within the areas targeted by this assay, and inadequate number of viral copies (<250 copies / mL). A negative result must be combined with clinical observations, patient history, and epidemiological information.  Fact Sheet for Patients:   https://www.patel.info/  Fact Sheet for Healthcare Providers: https://hall.com/  This test is not yet approved or  cleared by the [place] FDA and has been authorized for detection and/or diagnosis of SARS-CoV-2 by FDA under  an Emergency Use Authorization (EUA).  This EUA will remain in effect (meaning this test can be used) for the duration of the COVID-19 declaration under Section 564(b)(1) of the Act, 21 U.S.C. section 360bbb-3(b)(1), unless the authorization is terminated or revoked sooner.  Performed at [facility], [place] [address] [place], [place] [address]      Radiology Studies: DG CHEST PORT 1 VIEW  Result Date: 09/10/2021 CLINICAL DATA:  Chest pain radiating to the left side in left arm EXAM: PORTABLE CHEST 1 VIEW COMPARISON:  09/08/2021 FINDINGS: Single lead pacer remains in place. The patient is rotated to the left on today's radiograph, reducing diagnostic sensitivity and specificity. Very prominent left pulmonary artery suspicious for pulmonary arterial hypertension. Atherosclerotic calcification of the aortic arch. Mild enlargement of the cardiopericardial silhouette, without edema. IMPRESSION: 1. Very prominent main pulmonary artery favoring pulmonary arterial hypertension. 2.  Aortic  Atherosclerosis (ICD10-I70.0). 3. Mild enlargement of the cardiopericardial silhouette, without overt edema. 4. Single lead pacer remains in place. 5. The patient is rotated to the left on today's radiograph, reducing diagnostic sensitivity and specificity. Electronically Signed   By: Van Clines M.D.   On: 09/10/2021 11:59     Scheduled Meds:  allopurinol  300 mg Oral Daily   citalopram  20 mg Oral QHS   insulin aspart  0-15 Units Subcutaneous TID AC & HS   insulin aspart  8 Units Subcutaneous TID WC   insulin detemir  18 Units Subcutaneous Daily   levothyroxine  75 mcg Oral QAC breakfast   losartan  50 mg Oral Daily   melatonin  10 mg Oral QHS   multivitamin with minerals  1 tablet Oral Daily   potassium chloride  20 mEq Oral Daily   rosuvastatin  10 mg Oral Daily   spironolactone  12.5 mg Oral Daily   torsemide  60 mg Oral BID   Warfarin - Pharmacist Dosing Inpatient   Does not apply q1600   Continuous Infusions:   LOS: 1 day    Time spent: 60min  Domenic Polite, MD [facility]   09/11/2021, 2:58 PM

## 2021-09-11 NOTE — Progress Notes (Signed)
ANTICOAGULATION CONSULT NOTE - Initial Consult  Pharmacy Consult for Warfarin  Indication: Afib, History of DVT/PE  Allergies  Allergen Reactions   Morphine And Related Rash    Patient Measurements: Height: 5\' 3"  (160 cm) Weight: 120.2 kg (265 lb) (scale c) IBW/kg (Calculated) : 52.4  Vital Signs: Temp: 97.8 F (36.6 C) (06/07 0900) Temp Source: Oral (06/07 1058) BP: 113/52 (06/07 1058) Pulse Rate: 60 (06/07 1058)  Labs: Recent Labs    09/08/21 1944 09/08/21 2216 09/08/21 2216 09/09/21 0040 09/09/21 0435 09/10/21 0452 09/10/21 1235 09/11/21 0450  HGB 10.4*  --   --   --  10.2*  --   --   --   HCT 33.0*  --   --   --  32.4*  --   --   --   PLT 141*  --   --   --  136*  --   --   --   LABPROT  --  29.6*   < >  --  30.0* 27.9*  --  27.4*  INR  --  2.8*   < >  --  2.9* 2.6*  --  2.6*  CREATININE 1.33*  --   --   --  1.26* 1.26*  --  1.43*  TROPONINIHS  --  56*  --  58*  --   --  51*  --    < > = values in this interval not displayed.     Estimated Creatinine Clearance: 49.2 mL/min (A) (by C-G formula based on SCr of 1.43 mg/dL (H)).   Medical History: Past Medical History:  Diagnosis Date   A-fib ([place])    Anemia    Anticoagulated on Coumadin, chronically 09/03/2011   Anxiety    Arthritis    "right hip; both knees; left wrist/shoulder; back" (01/19/2013"   Bleeding on Coumadin 08/2012; 01/18/2013   [facility] admissions (01/19/2013)   CHF (congestive heart failure) ([place])    "2-3 times" (01/19/2013)   Chronic lower back pain    Depression    DVT (deep venous thrombosis) (H[facility]) 10 years ago   numerous/notes 01/18/2013   GERD (gastroesophageal reflux disease)    Gout    Headache(784.0)    "maybe weekly" (01/19/2013)   Heart murmur    High cholesterol    "been off RX for this at one time" (01/18/2013)   History of blood transfusion 1983; 04/2012   "3 w/ childbirth; hospitalized for pain" (01/19/2013)   Hypertension    Hypothyroidism    Migraines    "twice/yr  maybe" (01/19/2013)   Obstructive sleep apnea 05/03/2012   OSA (obstructive sleep apnea)    "sent me for test in 04/2012; never ordered mask, etc" (01/19/2013)   PE (pulmonary thromboembolism) ([place]) 3 years ag0   3/notes 01/18/2013   Pneumonia before 2011   "once' (01/18/2013)   Renal disorder    kindey function low; "Metformin was destroying my kidneys" (01/19/2013)   Shortness of breath    "only related to my CHF" (01/18/2013)   Swelling of hand 08/31/2014   RT HAND   Type II diabetes mellitus ([place])    UTI (urinary tract infection) 08/31/2014      Assessment: 66 y/o F presents to the ED with shortness of breath, on warfarin PTA for afib and history of DVT/PE. PTA warfarin dose listed at 5mg  daily six days a week and 7 mg the other day of the week (patient also states 5mg  daily all week, so unsure of current dosing).  INR 2.6 today after warfarin 5mg  x2 days  Goal of Therapy:  INR 2-3 Monitor platelets by anticoagulation protocol: Yes   Plan:  Will start warfarin 5mg  daily  Daily PT/INR Monitor for bleeding  Thank you for allowing pharmacy to be a part of this patient's care.  Donnald Garre, PharmD Clinical Pharmacist  Please check AMION for all [place] numbers After 10:00 PM, call [place] [phone]

## 2021-09-11 NOTE — TOC Benefit Eligibility Note (Signed)
Patient Teacher, English as a foreign language completed.    The patient is currently admitted and upon discharge could be taking Entresto 24-26 mg.  The current 30 day co-pay is, $10.35.   The patient is insured through [place] Patient Advocate Specialist [place] Patient Advocate Team Direct Number: [phone]  Fax: [phone]

## 2021-09-11 NOTE — Progress Notes (Signed)
Mobility Specialist Progress Note:   09/11/21 1055  Mobility  Activity Ambulated with assistance in hallway  Level of Assistance Modified independent, requires aide device or extra time  Assistive Device [facility] Ambulated (ft) 280 ft  Activity Response Tolerated well  $Mobility charge 1 Mobility   Pt received in bed wiling to participate in mobility. No complaints of pain. Left in bed with call bell in reach and all needs met.   [facility] Linkyn Gobin Mobility Specialist

## 2021-09-11 NOTE — Progress Notes (Addendum)
Advanced Heart Failure Rounding Note  PCP-Cardiologist: None   Subjective:    Yesterday diuresed with IV lasix + metolazone. Started on spiro. I/O not accurate due to incontinence. Weight down 1 pounds.   Creatinine 1.26>1.43.   Feeling a little better. Denies SOB.    Objective:   Weight Range: 120.2 kg Body mass index is 46.94 kg/m.   Vital Signs:   Temp:  [97.7 F (36.5 C)-98.4 F (36.9 C)] 97.8 F (36.6 C) (06/07 0345) Pulse Rate:  [56-66] 60 (06/07 0345) Resp:  [14-18] 18 (06/07 0345) BP: (108-159)/(48-77) 123/48 (06/07 0345) SpO2:  [94 %-98 %] 97 % (06/07 0345) Weight:  [120.2 kg] 120.2 kg (06/07 0345) Last BM Date : 09/10/21  Weight change: Filed Weights   09/08/21 1933 09/10/21 0326 09/11/21 0345  Weight: 123.4 kg 121 kg 120.2 kg    Intake/Output:   Intake/Output Summary (Last 24 hours) at 09/11/2021 0825 Last data filed at 09/11/2021 0804 Gross per 24 hour  Intake 1316 ml  Output 1000 ml  Net 316 ml      Physical Exam    General: . No resp difficulty HEENT: Normal Neck: Supple. JVP 9-10  . Carotids 2+ bilat; no bruits. No lymphadenopathy or thyromegaly appreciated. Cor: PMI nondisplaced. Irregular rate & rhythm. No rubs, gallops or murmurs. Lungs: Clear Abdomen: Soft, nontender, nondistended. No hepatosplenomegaly. No bruits or masses. Good bowel sounds. Extremities: No cyanosis, clubbing, rash, R and LLE unna boots. No edema upper thighs. Neuro: Alert & orientedx3, cranial nerves grossly intact. moves all 4 extremities w/o difficulty. Affect pleasant   Telemetry   A fib controlled rate  EKG    N/A  Labs    CBC Recent Labs    09/08/21 1944 09/09/21 0435  WBC 5.5 5.8  NEUTROABS  --  4.1  HGB 10.4* 10.2*  HCT 33.0* 32.4*  MCV 106.5* 104.5*  PLT 141* 373*   Basic Metabolic Panel Recent Labs    09/09/21 0435 09/10/21 0452 09/11/21 0450  NA 144 142 138  K 3.2* 3.9 4.8  CL 106 105 97*  CO2 30 31 32  GLUCOSE 147* 98 107*   BUN 32* 32* 35*  CREATININE 1.26* 1.26* 1.43*  CALCIUM 8.9 9.1 9.2  MG 1.8  --  2.4   Liver Function Tests Recent Labs    09/08/21 1944 09/09/21 0435  AST 27 24  ALT 28 25  ALKPHOS 72 62  BILITOT 0.5 1.2  PROT 6.0* 6.0*  ALBUMIN 3.4* 3.4*   No results for input(s): LIPASE, AMYLASE in the last 72 hours. Cardiac Enzymes No results for input(s): CKTOTAL, CKMB, CKMBINDEX, TROPONINI in the last 72 hours.  BNP: BNP (last 3 results) Recent Labs    09/08/21 1944  BNP 490.2*    ProBNP (last 3 results) No results for input(s): PROBNP in the last 8760 hours.   D-Dimer No results for input(s): DDIMER in the last 72 hours. Hemoglobin A1C Recent Labs    09/09/21 0435  HGBA1C 6.0*   Fasting Lipid Panel No results for input(s): CHOL, HDL, LDLCALC, TRIG, CHOLHDL, LDLDIRECT in the last 72 hours. Thyroid Function Tests Recent Labs    09/11/21 0450  TSH 4.530*    Other results:   Imaging    DG CHEST PORT 1 VIEW  Result Date: 09/10/2021 CLINICAL DATA:  Chest pain radiating to the left side in left arm EXAM: PORTABLE CHEST 1 VIEW COMPARISON:  09/08/2021 FINDINGS: Single lead pacer remains in place. The patient is  rotated to the left on today's radiograph, reducing diagnostic sensitivity and specificity. Very prominent left pulmonary artery suspicious for pulmonary arterial hypertension. Atherosclerotic calcification of the aortic arch. Mild enlargement of the cardiopericardial silhouette, without edema. IMPRESSION: 1. Very prominent main pulmonary artery favoring pulmonary arterial hypertension. 2.  Aortic Atherosclerosis (ICD10-I70.0). 3. Mild enlargement of the cardiopericardial silhouette, without overt edema. 4. Single lead pacer remains in place. 5. The patient is rotated to the left on today's radiograph, reducing diagnostic sensitivity and specificity. Electronically Signed   By: Van Clines M.D.   On: 09/10/2021 11:59     Medications:     Scheduled  Medications:  allopurinol  300 mg Oral Daily   citalopram  20 mg Oral QHS   furosemide  80 mg Intravenous BID   insulin aspart  0-15 Units Subcutaneous TID AC & HS   insulin aspart  8 Units Subcutaneous TID WC   insulin detemir  18 Units Subcutaneous Daily   isosorbide mononitrate  60 mg Oral Daily   levothyroxine  75 mcg Oral QAC breakfast   losartan  50 mg Oral Daily   melatonin  10 mg Oral QHS   multivitamin with minerals  1 tablet Oral Daily   potassium chloride  40 mEq Oral BID   rosuvastatin  10 mg Oral Daily   spironolactone  12.5 mg Oral Daily   Warfarin - Pharmacist Dosing Inpatient   Does not apply q1600    Infusions:   PRN Medications: acetaminophen **OR** [DISCONTINUED] acetaminophen, hydrALAZINE, HYDROmorphone (DILAUDID) injection, ipratropium-albuterol, ondansetron **OR** ondansetron (ZOFRAN) IV, polyethylene glycol    Patient Profile   Ms Royle is a 66 year old with a history of obesity, HTN, DM, atrial fib, DVT, PE x3 , s/p IVC filter placed in 2013, CHB s/p MDT single chamber PPM 08/2018, OSA, and chronic diastolic heart failure.    Admitted with A/C HFpEF   Assessment/Plan   1. A/C HFpEF ECHO 12/2017 EF 65-70% , moderately increased LVH. [place] 12/2018 with preserved cardiac output and low filling pressures. PYP scan not convincing for amyloid.  Echo this admit EF 60-65% RV normal .  Admitted with volume overload in the setting of high sodium diet. CXR with pulmonary edema. BNP mid 400s.  - Volume status improved. Give IV lasix this morning. Switch to torsemide 60 mg twice a day.  Continue 12.5 mg spironolactone.  -Considered SGLT2i but concerned about hygeine/urinary incontinence.  - Renal function stable. Discussed low salt food choices and limiting fluid intake.  Consult dietitian.   2. CKD Stage IIIa Creatinine baseline ~1.5-1.7 Renal function remains stable. Creatinine 1.4 today.    3. H/O CHB with MDT PPM   4. DMII Hgb A1C 6.  On SSI    5.  Hypothyroidism  On levothyroxine. Per primary team.    6. H/O PEs - On coumadin    7. OSA    8. Atrial fibrillation: Chronic, was in AF in 2021.   Will set up HF f/u   Length of Stay: 1  Amy Clegg, NP  09/11/2021, 8:25 AM  Advanced Heart Failure Team Pager [phone] (M-F; 7a - 5p)  Please contact [facility] for night-coverage after hours (5p -7a ) and weekends on [email or web address]  Patient seen with NP, agree with the above note.   I/Os not fully recorded but weight trending down and she feels better.  Creatinine up to 1.43 today.    General: NAD Neck: JVP 8-9 cm, no thyromegaly or thyroid nodule.  Lungs: Clear  to auscultation bilaterally with normal respiratory effort. CV: Nondisplaced PMI.  Heart irregular S1/S2, no S3/S4, no murmur.  Trace ankle edema.  Abdomen: Soft, nontender, no hepatosplenomegaly, no distention.  Skin: Intact without lesions or rashes.  Neurologic: Alert and oriented x 3.  Psych: Normal affect. Extremities: No clubbing or cyanosis.  HEENT: Normal.   With rise in creatinine and improved symptoms though still mild volume overload on exam, will give 1 more dose of IV Lasix this morning then convert to torsemide 60 mg bid this evening.  Continue spironolactone.  No SGLT2 inhibitor yet due to concerns about infection risk with urinary incontinence.   I think she would be a good Cardiomems candidate, can address this as outpatient.   Loralie Champagne 09/11/2021 9:11 AM

## 2021-09-11 NOTE — Progress Notes (Signed)
Initial Nutrition Assessment  DOCUMENTATION CODES:   Morbid obesity  INTERVENTION:  Provide Ensure Enlive po BID, each supplement provides 350 kcal and 20 grams of protein.  Diet handout given.   NUTRITION DIAGNOSIS:   Increased nutrient needs related to chronic illness (HF) as evidenced by estimated needs.  GOAL:   Patient will meet greater than or equal to 90% of their needs  MONITOR:   PO intake, Supplement acceptance, Labs, Weight trends, Skin, I & O's  REASON FOR ASSESSMENT:   Consult Diet education  ASSESSMENT:   66 y.o. female with a history of chronic HfpEF, CHB s/p PPM, DVT, PAF, HTN, HLD, T2DM, who presented with leg swelling, and dyspnea as well as a sense of chest heaviness. Pt volume overloaded on exam with CXR showing pulmonary edema. Patient was admitted for acute on chronic HFpEF.  Pt unavailable at time of visit. Meal completion has been varied from 20-100%. RD to order nutritional supplements to aid in caloric and protein needs. Diet handout "Low Sodium Nutrition Therapy" from the Academy of Nutrition and Dietetics Manual placed in discharge instructions. Unable to complete Nutrition-Focused physical exam at this time.   Labs and medications reviewed.   Diet Order:   Diet Order             Diet heart healthy/carb modified Room service appropriate? Yes; Fluid consistency: Thin  Diet effective now                   EDUCATION NEEDS:   Education needs have been addressed  Skin:  Skin Assessment: Reviewed RN Assessment  Last BM:  6/6  Height:   Ht Readings from Last 1 Encounters:  09/08/21 5\' 3"  (1.6 m)    Weight:   Wt Readings from Last 1 Encounters:  09/11/21 120.2 kg   BMI:  Body mass index is 46.94 kg/m.  Estimated Nutritional Needs:   Kcal:  1800-2000  Protein:  95-110 grams  Fluid:  1.8 L/day  Corrin Parker, MS, RD, LDN RD pager number/after hours weekend pager number on Amion.

## 2021-09-11 NOTE — Discharge Instructions (Signed)

## 2021-09-11 NOTE — Plan of Care (Signed)
  Problem: Education: Goal: Ability to demonstrate management of disease process will improve Outcome: Progressing Goal: Ability to verbalize understanding of medication therapies will improve Outcome: Progressing   Problem: Activity: Goal: Capacity to carry out activities will improve Outcome: Progressing   Problem: Cardiac: Goal: Ability to achieve and maintain adequate cardiopulmonary perfusion will improve Outcome: Progressing   Problem: Coping: Goal: Ability to adjust to condition or change in health will improve Outcome: Progressing   Problem: Fluid Volume: Goal: Ability to maintain a balanced intake and output will improve Outcome: Progressing   Problem: Health Behavior/Discharge Planning: Goal: Ability to identify and utilize available resources and services will improve Outcome: Progressing Goal: Ability to manage health-related needs will improve Outcome: Progressing

## 2021-09-11 NOTE — Plan of Care (Signed)
  Problem: Education: Goal: Ability to demonstrate management of disease process will improve Outcome: Progressing Goal: Ability to verbalize understanding of medication therapies will improve Outcome: Progressing Goal: Individualized Educational Video(s) Outcome: Progressing   Problem: Activity: Goal: Capacity to carry out activities will improve Outcome: Progressing   Problem: Cardiac: Goal: Ability to achieve and maintain adequate cardiopulmonary perfusion will improve Outcome: Progressing   Problem: Education: Goal: Ability to describe self-care measures that may prevent or decrease complications (Diabetes Survival Skills Education) will improve Outcome: Progressing   Problem: Coping: Goal: Ability to adjust to condition or change in health will improve Outcome: Progressing   Problem: Health Behavior/Discharge Planning: Goal: Ability to identify and utilize available resources and services will improve Outcome: Progressing Goal: Ability to manage health-related needs will improve Outcome: Progressing   Problem: Metabolic: Goal: Ability to maintain appropriate glucose levels will improve Outcome: Progressing

## 2021-09-12 ENCOUNTER — Encounter (HOSPITAL_COMMUNITY): Payer: Medicare HMO

## 2021-09-12 ENCOUNTER — Other Ambulatory Visit (HOSPITAL_COMMUNITY): Payer: Self-pay

## 2021-09-12 DIAGNOSIS — I5033 Acute on chronic diastolic (congestive) heart failure: Secondary | ICD-10-CM | POA: Diagnosis not present

## 2021-09-12 LAB — PROTIME-INR
INR: 2.5 — ABNORMAL HIGH (ref 0.8–1.2)
Prothrombin Time: 26.6 seconds — ABNORMAL HIGH (ref 11.4–15.2)

## 2021-09-12 LAB — BASIC METABOLIC PANEL
Anion gap: 11 (ref 5–15)
BUN: 49 mg/dL — ABNORMAL HIGH (ref 8–23)
CO2: 30 mmol/L (ref 22–32)
Calcium: 9.1 mg/dL (ref 8.9–10.3)
Chloride: 95 mmol/L — ABNORMAL LOW (ref 98–111)
Creatinine, Ser: 1.55 mg/dL — ABNORMAL HIGH (ref 0.44–1.00)
GFR, Estimated: 37 mL/min — ABNORMAL LOW (ref >60)
Glucose, Bld: 94 mg/dL (ref 70–99)
Potassium: 4.8 mmol/L (ref 3.5–5.1)
Sodium: 136 mmol/L (ref 135–145)

## 2021-09-12 LAB — GLUCOSE, CAPILLARY
Glucose-Capillary: 99 mg/dL (ref 70–99)
Glucose-Capillary: 120 mg/dL — ABNORMAL HIGH (ref 70–99)

## 2021-09-12 LAB — MAGNESIUM: Magnesium: 2.4 mg/dL (ref 1.7–2.4)

## 2021-09-12 MED ORDER — INSULIN ASPART 100 UNIT/ML ~~LOC~~ SOLN: 6.0000 [IU] | Freq: Three times a day (TID) | SUBCUTANEOUS | Status: DC

## 2021-09-12 MED ORDER — TORSEMIDE 20 MG PO TABS
60.0000 mg | ORAL_TABLET | Freq: Two times a day (BID) | ORAL | 0 refills | Status: DC
  Filled 2021-09-12: qty 200, 33d supply, fill #0

## 2021-09-12 MED ORDER — SPIRONOLACTONE 25 MG PO TABS
12.5000 mg | ORAL_TABLET | Freq: Every day | ORAL | 0 refills | Status: DC
  Filled 2021-09-12: qty 30, 60d supply, fill #0

## 2021-09-12 MED ORDER — POTASSIUM CHLORIDE CRYS ER 20 MEQ PO TBCR
20.0000 meq | EXTENDED_RELEASE_TABLET | Freq: Every day | ORAL | 0 refills | Status: DC
  Filled 2021-09-12: qty 30, 30d supply, fill #0

## 2021-09-12 NOTE — Progress Notes (Signed)
Nutrition Follow-up  DOCUMENTATION CODES:   Morbid obesity  INTERVENTION:  Provide Ensure Enlive po BID, each supplement provides 350 kcal and 20 grams of protein.   Diet education given.    NUTRITION DIAGNOSIS:   Increased nutrient needs related to chronic illness (HF) as evidenced by estimated needs; ongoing  GOAL:   Patient will meet greater than or equal to 90% of their needs; progressing  MONITOR:   PO intake, Supplement acceptance, Labs, Weight trends, Skin, I & O's  REASON FOR ASSESSMENT:   Consult Diet education  ASSESSMENT:   66 y.o. female with a history of chronic HfpEF, CHB s/p PPM, DVT, PAF, HTN, HLD, T2DM, who presented with leg swelling, and dyspnea as well as a sense of chest heaviness. Pt volume overloaded on exam with CXR showing pulmonary edema. Patient was admitted for acute on chronic HFpEF.  Meal completion has been 45-70% today. Pt currently has Ensure ordered and has been consuming them. RD to continue with current orders to aid in caloric and protein needs. Reviewed patient's dietary recall. Provided examples on ways to decrease sodium intake in diet. Discouraged intake of processed foods and use of salt shaker. Encouraged fresh fruits and vegetables as well as whole grain sources of carbohydrates to maximize fiber intake. Pt reports understanding of information discussed. Plans for discharge today.   NUTRITION - FOCUSED PHYSICAL EXAM:  Flowsheet Row Most Recent Value  Orbital Region No depletion  Upper Arm Region No depletion  Thoracic and Lumbar Region No depletion  Buccal Region No depletion  Temple Region No depletion  Clavicle Bone Region No depletion  Clavicle and Acromion Bone Region No depletion  Scapular Bone Region Unable to assess  Dorsal Hand No depletion  Patellar Region No depletion  Anterior Thigh Region No depletion  Edema (RD Assessment) Moderate  Hair Reviewed  Eyes Reviewed  Mouth Reviewed  Skin Reviewed  Nails Reviewed       Labs and medications reviewed.   Diet Order:   Diet Order             Diet - low sodium heart healthy           Diet heart healthy/carb modified Room service appropriate? Yes; Fluid consistency: Thin  Diet effective now                   EDUCATION NEEDS:   Education needs have been addressed  Skin:  Skin Assessment: Reviewed RN Assessment  Last BM:  6/6  Height:   Ht Readings from Last 1 Encounters:  09/08/21 5\' 3"  (1.6 m)    Weight:   Wt Readings from Last 1 Encounters:  09/12/21 119 kg   BMI:  Body mass index is 46.47 kg/m.  Estimated Nutritional Needs:   Kcal:  1800-2000  Protein:  95-110 grams  Fluid:  1.8 L/day  Corrin Parker, MS, RD, LDN RD pager number/after hours weekend pager number on Amion.

## 2021-09-12 NOTE — Progress Notes (Signed)
ANTICOAGULATION CONSULT NOTE Follow up [facility] for Warfarin  Indication: Afib, History of DVT/PE  Allergies  Allergen Reactions   Morphine And Related Rash    Patient Measurements: Height: 5\' 3"  (160 cm) Weight: 119 kg (262 lb 5.6 oz) IBW/kg (Calculated) : 52.4  Vital Signs: Temp: 98.1 F (36.7 C) (06/08 1136) Temp Source: Oral (06/08 1136) BP: 106/47 (06/08 1136) Pulse Rate: 63 (06/08 1136)  Labs: Recent Labs    09/10/21 0452 09/10/21 1235 09/11/21 0450 09/12/21 0351  LABPROT 27.9*  --  27.4* 26.6*  INR 2.6*  --  2.6* 2.5*  CREATININE 1.26*  --  1.43* 1.55*  TROPONINIHS  --  51*  --   --      Estimated Creatinine Clearance: 45.1 mL/min (A) (by C-G formula based on SCr of 1.55 mg/dL (H)).   Medical History: Past Medical History:  Diagnosis Date   A-fib ([place])    Anemia    Anticoagulated on Coumadin, chronically 09/03/2011   Anxiety    Arthritis    "right hip; both knees; left wrist/shoulder; back" (01/19/2013"   Bleeding on Coumadin 08/2012; 01/18/2013   [facility] admissions (01/19/2013)   CHF (congestive heart failure) ([place])    "2-3 times" (01/19/2013)   Chronic lower back pain    Depression    DVT (deep venous thrombosis) (HCC) 10 years ago   numerous/notes 01/18/2013   GERD (gastroesophageal reflux disease)    Gout    Headache(784.0)    "maybe weekly" (01/19/2013)   Heart murmur    High cholesterol    "been off RX for this at one time" (01/18/2013)   History of blood transfusion 1983; 04/2012   "3 w/ childbirth; hospitalized for pain" (01/19/2013)   Hypertension    Hypothyroidism    Migraines    "twice/yr maybe" (01/19/2013)   Obstructive sleep apnea 05/03/2012   OSA (obstructive sleep apnea)    "sent me for test in 04/2012; never ordered mask, etc" (01/19/2013)   PE (pulmonary thromboembolism) ([place]) 3 years ag0   3/notes 01/18/2013   Pneumonia before 2011   "once' (01/18/2013)   Renal disorder    kindey function low; "Metformin  was destroying my kidneys" (01/19/2013)   Shortness of breath    "only related to my CHF" (01/18/2013)   Swelling of hand 08/31/2014   RT HAND   Type II diabetes mellitus ([place])    UTI (urinary tract infection) 08/31/2014      Assessment: 66 y/o F presents to the ED with shortness of breath, on warfarin PTA for afib and history of DVT/PE. PTA warfarin dose listed at 5mg  daily six days a week and 7 mg the other day of the week (patient also states 5mg  daily all week, so unsure of current dosing).   INR 2.5 today on warfarin 5mg  daily   Goal of Therapy:  INR 2-3 Monitor platelets by anticoagulation protocol: Yes   Plan:  P[lan DC home on PTA warfarin and directed to follow up with INR as outpatient Monitor for bleeding   Bonnita Nasuti Pharm.D. CPP, BCPS Clinical Pharmacist [phone] 09/12/2021 12:35 PM   Please check AMION for all [place] numbers After 10:00 PM, call [place] [phone]

## 2021-09-12 NOTE — Progress Notes (Signed)
Mobility Specialist Progress Note:   09/12/21 1403  Mobility  Activity Ambulated with assistance in hallway  Level of Assistance Modified independent, requires aide device or extra time  Assistive Device [facility] Ambulated (ft) 550 ft  Activity Response Tolerated well  $Mobility charge 1 Mobility   Pt received in bed willing to participate in mobility. Complaints of L hip pain. Left EOB with call bell in reach and all needs met.   [facility] [place] Nelligan Mobility Specialist

## 2021-09-12 NOTE — Progress Notes (Signed)
Orthopedic Tech Progress Note Patient Details:  [name] 1955-06-18 [id number]   Ortho Devices Type of Ortho Device: Louretta Parma boot Ortho Device/Splint Location: BLE Ortho Device/Splint Interventions: Ordered, Application   Post Interventions Patient Tolerated: Well Instructions Provided: Care of device, Adjustment of device  [name] Jeri Modena 09/12/2021, 1:36 PM

## 2021-09-12 NOTE — TOC Initial Note (Addendum)
Transition of Care [facility]) - Initial/Assessment Note    Patient Details  Name: [name] MRN: [id number] Date of Birth: 02/12/1956  Transition of [facility] [place]) CM/SW Contact:    Erenest Rasher, RN Phone Number: [phone] 09/12/2021, 1:01 PM  Clinical Narrative:                  HF TOC CM spoke to pt at bedside. Contacted [employer] rep, Marjory Lies with [place]. Pt reports having scale, CPAP, RW, cane and bars in bathroom. Her roommate will provide transportation.   Received call from [facility] agency rep, Marjory Lies, will need order to complete ABI to do unna boots at home. Attending updated.     Expected Discharge Plan: [place] Barriers to Discharge: No Barriers Identified   Patient Goals and CMS Choice Patient states their goals for this hospitalization and ongoing recovery are:: return home with [facility] CMS Medicare.gov Compare Post Acute Care list provided to:: Patient Choice offered to / list presented to : Patient  Expected Discharge Plan and Services Expected Discharge Plan: [place]   Discharge Planning Services: CM Consult Post Acute Care Choice: [place] arrangements for the past 2 months: Single Family Home Expected Discharge Date: 09/12/21                 DME Agency: NA       HH Arranged: RN [place] Agency: [place] Date HH Agency Contacted: 09/10/21 Time [place]: 1017 Representative spoke with at [place]: Marjory Lies  Prior Living Arrangements/Services Living arrangements for the past 2 months: [place] with:: Roommate Patient language and need for interpreter reviewed:: Yes Do you feel safe going back to the place where you live?: Yes      Need for Family Participation in Patient Care: Yes (Comment) Care giver support system in place?: Yes (comment)   Criminal Activity/Legal Involvement Pertinent to Current Situation/Hospitalization: No - Comment as needed  Activities of Daily  Living      Permission Sought/Granted                  Emotional Assessment Appearance:: Appears stated age Attitude/Demeanor/Rapport: Engaged Affect (typically observed): Accepting Orientation: : Oriented to Self, Oriented to Place, Oriented to  Time, Oriented to Situation Alcohol / Substance Use: Not Applicable Psych Involvement: No (comment)  Admission diagnosis:  Acute on chronic congestive heart failure (HCC) [I50.9] Acute on chronic congestive heart failure, unspecified heart failure type ([place]) [I50.9] Acute on chronic heart failure with preserved ejection fraction (HFpEF) ([place]) [I50.33] Patient Active Problem List   Diagnosis Date Noted   Acute on chronic heart failure with preserved ejection fraction (HFpEF) ([place]) 09/10/2021   Type 2 diabetes mellitus with stage 3b chronic kidney disease, with long-term current use of insulin ([place]) 09/09/2021   Mixed diabetic hyperlipidemia associated with type 2 diabetes mellitus ([place]) 09/09/2021   Hypothyroidism 09/09/2021   Acute cardiogenic pulmonary edema ([place]) 09/09/2021   Wound of left leg, initial encounter 09/09/2021   C6 cervical fracture ([place]) 09/01/2018   Atrial flutter ([place]) 09/01/2018   Paroxysmal atrial fibrillation ([place]) 09/01/2018   Syncope 08/29/2018   Symptomatic advanced heart block 08/29/2018   Acute on chronic diastolic CHF (congestive heart failure) (HCC)    COPD (chronic obstructive pulmonary disease) ([place]) 09/12/2015   Personal history of endocrine, metabolic or immunity disorder 04/03/2015   Decreased mobility 04/03/2015   DVT (deep venous thrombosis), bilateral 02/24/2015  Chronic kidney disease, stage 3b ([place]) 02/24/2015   Long term current use of anticoagulant therapy 11/19/2014   Osteoarthritis 08/31/2014   Cellulitis of hand, right 08/31/2014   Tremors  05/07/2012   Obstructive sleep apnea 05/03/2012   Anticoagulated on Coumadin, chronically 09/03/2011   Thrombocytopenia, this admission, 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 08/13/2011   History of pulmonary embolism, recurrent, 3 seperate episodes. 08/13/2011   Presence of IVC filter, placed June 2011 after 3d PE 08/13/2011   Pulmonary HTN, Moderate  PA pressure 31mmHG 09/2009 08/13/2011   PCP:  [name], MD Pharmacy:   [id number] - [place], [place] [address]  [place] [address] Phone: [phone] Fax: [phone]  [facility] [address] [place] [address] Phone: [phone] Fax: [phone]     Social Determinants of Health (SDOH) Interventions    Readmission Risk Interventions     No data to display

## 2021-09-12 NOTE — Progress Notes (Signed)
Discharge instructions completed. All medications, follow up appointments, and discharge instructions provided. IV out. Monitor off CCMD notified. Wound care completed before discharge and unna boots reapplied. Discharging home.  Era Bumpers, RN

## 2021-09-12 NOTE — Progress Notes (Addendum)
Advanced Heart Failure Rounding Note  PCP-Cardiologist: None   Subjective:    Last dose IV lasix yesterday am. Transitioned to torsemide 60 mg BID last night.   Weight down another 3 lb. Is/Os not accurate d/t unmeasured voids  No BMET this am.  Feeling better each day. Ambulated with mobility yesterday. Denies dyspnea.    Objective:   Weight Range: 119 kg Body mass index is 46.47 kg/m.   Vital Signs:   Temp:  [97.8 F (36.6 C)-98.7 F (37.1 C)] 97.9 F (36.6 C) (06/08 0403) Pulse Rate:  [60-66] 61 (06/08 0403) Resp:  [16-18] 16 (06/08 0403) BP: (110-133)/(43-64) 110/43 (06/08 0403) SpO2:  [94 %-96 %] 94 % (06/08 0403) Weight:  [025 kg] 119 kg (06/08 0403) Last BM Date : 09/11/21  Weight change: Filed Weights   09/10/21 0326 09/11/21 0345 09/12/21 0403  Weight: 121 kg 120.2 kg 119 kg    Intake/Output:   Intake/Output Summary (Last 24 hours) at 09/12/2021 0755 Last data filed at 09/12/2021 0600 Gross per 24 hour  Intake 1133 ml  Output 1100 ml  Net 33 ml      Physical Exam    General:  No distress. Sitting up in bed eating breakfast. HEENT: normal Neck: supple. JVP 7-8. Carotids 2+ bilat; no bruits.  Cor: PMI nondisplaced. Irregular rate & rhythm. No rubs, gallops or murmurs. Lungs: diminished Abdomen: obese, soft, nontender, nondistended.  Extremities: no cyanosis, clubbing, rash, + UNNA boots Neuro: alert & orientedx3, cranial nerves grossly intact. moves all 4 extremities w/o difficulty. Affect pleasant    Telemetry   Afib 50s-60s, intermittently V-paced  EKG    N/A  Labs    CBC No results for input(s): "WBC", "NEUTROABS", "HGB", "HCT", "MCV", "PLT" in the last 72 hours.  Basic Metabolic Panel Recent Labs    09/10/21 0452 09/11/21 0450 09/12/21 0351  NA 142 138  --   K 3.9 4.8  --   CL 105 97*  --   CO2 31 32  --   GLUCOSE 98 107*  --   BUN 32* 35*  --   CREATININE 1.26* 1.43*  --   CALCIUM 9.1 9.2  --   MG  --  2.4 2.4    Liver Function Tests No results for input(s): "AST", "ALT", "ALKPHOS", "BILITOT", "PROT", "ALBUMIN" in the last 72 hours.  No results for input(s): "LIPASE", "AMYLASE" in the last 72 hours. Cardiac Enzymes No results for input(s): "CKTOTAL", "CKMB", "CKMBINDEX", "TROPONINI" in the last 72 hours.  BNP: BNP (last 3 results) Recent Labs    09/08/21 1944  BNP 490.2*    ProBNP (last 3 results) No results for input(s): "PROBNP" in the last 8760 hours.   D-Dimer No results for input(s): "DDIMER" in the last 72 hours. Hemoglobin A1C No results for input(s): "HGBA1C" in the last 72 hours.  Fasting Lipid Panel No results for input(s): "CHOL", "HDL", "LDLCALC", "TRIG", "CHOLHDL", "LDLDIRECT" in the last 72 hours. Thyroid Function Tests Recent Labs    09/11/21 0450  TSH 4.530*    Other results:   Imaging    No results found.   Medications:     Scheduled Medications:  allopurinol  300 mg Oral Daily   citalopram  20 mg Oral QHS   feeding supplement  237 mL Oral BID BM   insulin aspart  0-15 Units Subcutaneous TID AC & HS   insulin aspart  8 Units Subcutaneous TID WC   insulin detemir  18 Units Subcutaneous  Daily   levothyroxine  75 mcg Oral QAC breakfast   losartan  50 mg Oral Daily   melatonin  10 mg Oral QHS   multivitamin with minerals  1 tablet Oral Daily   potassium chloride  20 mEq Oral Daily   rosuvastatin  10 mg Oral Daily   spironolactone  12.5 mg Oral Daily   torsemide  60 mg Oral BID   warfarin  5 mg Oral q1600   Warfarin - Pharmacist Dosing Inpatient   Does not apply q1600    Infusions:   PRN Medications: acetaminophen **OR** [DISCONTINUED] acetaminophen, hydrALAZINE, HYDROmorphone (DILAUDID) injection, ipratropium-albuterol, ondansetron **OR** ondansetron (ZOFRAN) IV, polyethylene glycol    Patient Profile   Ms Hulick is a 66 year old with a history of obesity, HTN, DM, atrial fib, DVT, PE x3 , s/p IVC filter placed in 2013, CHB s/p MDT  single chamber PPM 08/2018, OSA, and chronic diastolic heart failure.    Admitted with A/C HFpEF   Assessment/Plan   1. A/C HFpEF - ECHO 12/2017 EF 65-70% , moderately increased LVH. [place] 12/2018 with preserved cardiac output and low filling pressures. PYP scan not convincing for amyloid.  Echo this admit EF 60-65% RV normal. -Admitted with volume overload in the setting of high sodium diet. CXR with pulmonary edema. BNP mid 400s.  - Volume looks stable on exam.  - Continue Torsemide 60 BID - Check BMET this am - Continue 12.5 mg spironolactone.  - Continue losartan 50 mg daily. Would be hesitant switching to entresto with low diastolic pressures - Considered SGLT2i but concerned about hygeine/urinary incontinence.  - Renal function stable. - Discussed low salt food choices and limiting fluid intake. Consulted dietician. Appreciate assistance. - May be a good Cardiomems placement, she is interested in starting the process as outpatient   2. CKD Stage IIIa - Creatinine baseline ~1.5-1.7 - Renal has been stable.  - Labs today   3. H/O CHB with MDT PPM   4. DMII - Hgb A1C 6 - On SSI  - No SGLT2i with concern for UTI risk   5. Hypothyroidism  - On levothyroxine.  - Per primary team.    6. H/O PEs - On coumadin    7. OSA  - Unable to tolerate CPAP.   8. Atrial fibrillation:  - Chronic, was in AF in 2021.  - Rate controlled - Continue coumadin  May be ready for discharge from HF perspective. Will review with Dr. Aundra Dubin. Has f/u in HF clinic scheduled.    Length of Stay: 2  FIN[facility], LINDSAY N, PA-C  09/12/2021, 7:55 AM  Advanced Heart Failure Team Pager [phone] (M-F; 7a - 5p)  Please contact [facility] for night-coverage after hours (5p -7a ) and weekends on [email or web address]  Patient seen with PA, agree with the above note.   She is now on po torsemide.  Weight down 3 lbs.  C/o headache.   General: NAD Neck: JVP 8 cm, no thyromegaly or thyroid nodule.  Lungs: Clear to  auscultation bilaterally with normal respiratory effort. CV: Nondisplaced PMI.  Heart irregular S1/S2, no S3/S4, 2/6 SEM RUSB.  Trace ankle edema.  No carotid bruit.  Normal pedal pulses.  Abdomen: Soft, nontender, no hepatosplenomegaly, no distention.  Skin: Intact without lesions or rashes.  Neurologic: Alert and oriented x 3.  Psych: Normal affect. Extremities: No clubbing or cyanosis.  HEENT: Normal.   Patient is ready for home from CHF standpoint.  Would increase home torsemide to 60 mg bid.  Continue spironolactone 12.5 mg daily. No SGLT2 inhibitor today due to concern for infection risk with incontinence.    Workup for Cardiomems as outpatient.   Atrial fibrillation appears chronic.   Loralie Champagne 09/12/2021 11:10 AM

## 2021-09-12 NOTE — Discharge Summary (Addendum)
Physician Discharge Summary  [name] ZRA:[id number] DOB: [birth year] DOA: 09/08/2021  PCP: [name], MD  Admit date: 09/08/2021 Discharge date: 09/12/2021  Time spent: 35 minutes  Recommendations for Outpatient Follow-up:  Heart failure clinic 6/21 PCP in 1 week, please check BMP at follow-up   Discharge Diagnoses:  Principal Problem:   Acute on chronic diastolic CHF (congestive heart failure) ([place]) Active Problems:   Acute cardiogenic pulmonary edema (HCC)   Chronic kidney disease, stage 3b (HCC)   Paroxysmal atrial fibrillation (HCC)   Type 2 diabetes mellitus with stage 3b chronic kidney disease, with long-term current use of insulin (HCC)   Essential hypertension   COPD (chronic obstructive pulmonary disease) ([place])   Hypothyroidism   Mixed diabetic hyperlipidemia associated with type 2 diabetes mellitus ([place])   Wound of left leg, initial encounter   Obstructive sleep apnea   Acute on chronic heart failure with preserved ejection fraction (HFpEF) ([place])   Discharge Condition: Stable  Diet recommendation: Low-sodium, diabetic  Filed Weights   09/10/21 0326 09/11/21 0345 09/12/21 0403  Weight: 121 kg 120.2 kg 119 kg    History of present illness:  66 y.o. female with a history of chronic HfpEF, CHB s/p PPM, DVT and PE on coumadin, PAF, HTN, HLD, T2DM, and morbid obesity who presented to the ED 6/4 with a wek of weight gain, leg swelling, and dyspnea as well as a sense of chest heaviness. She appeared volume overloaded on exam with CXR showing pulmonary edema, BNP 490, troponin 56 > 58.   Hospital Course:   Acute on chronic HFpEF:  -Repeat echo with preserved EF, normal RV, moderate LVH -Advanced heart failure team following -Diuresed with IV Lasix, volume status improving, transitioned to torsemide yesterday -Continue low-dose Aldactone, losartan -SGLT 2i avoided due to concerns regarding hygiene, urinary incontinence and risk of infection -Discharged home in a  stable condition, discussed importance of salt restriction again -Follow-up with heart failure team 6/21   Demand myocardial ischemia, chest pain:  -Troponin mildly elevated and flat. No WMA on echo.  -Cards following, no further work-up recommended   Chronic atrial fibrillation:  -Remains in A-fib, rate-controlled. - Continue coumadin    Recurrent DVT/PE:  - Continue coumadin.    CKD stage IIIa -Stable   Hypokalemia:  -Resolved   Type 2 diabetes mellitus -Controlled, HbA1c 6%.  - levemir + 6u TIDWC   HTN:  - ARB, imdur   Hypothyroidism:  - Continue synthroid at home dose. Check TSH (last checked in 2020).   Hyperlipidemia:  - Continue rosuvastatin    Wound of left leg Lesion on anterior surface of the left lower extremity consistent with a ruptured blister that has sloughed off -no evidence of infection -Continue routine wound care   Macrocytic anemia: L45 and folic acid levels wnl.  -PCP to monitor this,   OSA: Intolerant of CPAP.   Morbid obesity: Body mass index is 47.26 kg/m.  - Weight loss recommended.    Consultants: Advanced heart failure team  Discharge Exam: Vitals:   09/12/21 0854 09/12/21 1136  BP: (!) 108/44 (!) 106/47  Pulse: 65 63  Resp: 18 18  Temp:  98.1 F (36.7 C)  SpO2: 96% 94%    Chronically ill obese female sitting up in bed, AAOx3, no distress HEENT: no  JVD CVS: S1-S2, irregularly irregular rhythm Lungs: Clear bilaterally Abdomen: Soft, nontender, bowel sounds present Extremities: No edema, Unna boots noted Psychiatry:  Mood & affect appropriate.  Discharge Instructions  Discharge Instructions     Diet - low sodium heart healthy   Complete by: As directed    Discharge wound care:   Complete by: As directed    Clean LLE wounds with saline, pat dry.  Apply silver Hydrofiber (Aquacel AG+) cut to fit on wound bed, top with silicone foam, change Unna boots twice a week   Increase activity slowly   Complete by: As  directed       Allergies as of 09/12/2021       Reactions   Morphine And Related Rash        Medication List     STOP taking these medications    isosorbide mononitrate 60 MG 24 hr tablet Commonly known as: IMDUR       TAKE these medications    acetaminophen 500 MG tablet Commonly known as: TYLENOL Take 500 mg by mouth every 6 (six) hours as needed for fever or headache (pain).   allopurinol 300 MG tablet Commonly known as: ZYLOPRIM Take 300 mg by mouth daily.   Biotin 1000 MCG tablet Take 500 mcg by mouth daily with lunch.   citalopram 20 MG tablet Commonly known as: CELEXA Take 20 mg by mouth at bedtime.   CITRACAL/VITAMIN D PO Take 2 tablets by mouth every evening.   Fish Oil 1000 MG Caps Take 1,000 mg by mouth daily.   insulin aspart 100 UNIT/ML injection Commonly known as: novoLOG Inject 6 Units into the skin 3 (three) times daily with meals. What changed: how much to take   insulin detemir 100 UNIT/ML FlexPen Commonly known as: LEVEMIR Inject 18 Units into the skin daily before breakfast.   levothyroxine 75 MCG tablet Commonly known as: SYNTHROID Take 75 mcg by mouth daily before breakfast.   losartan 50 MG tablet Commonly known as: COZAAR Take 50 mg by mouth daily.   melatonin 5 MG Tabs Take 10 mg by mouth at bedtime.   multivitamin with minerals Tabs tablet Take 1 tablet by mouth daily.   Ozempic (0.25 or 0.5 MG/DOSE) 2 MG/1.5ML Sopn Generic drug: Semaglutide(0.25 or 0.5MG /DOS) Inject 0.25 mg into the skin once a week.   potassium chloride SA 20 MEQ tablet Commonly known as: KLOR-CON M Take 1 tablet (20 mEq total) by mouth daily. Absolute last refill without office visit please call [phone] to schedule What changed:  how much to take additional instructions   rosuvastatin 10 MG tablet Commonly known as: CRESTOR Take 1 tablet (10 mg total) by mouth daily.   spironolactone 25 MG tablet Commonly known as: ALDACTONE Take  0.5 tablets (12.5 mg total) by mouth daily. Start taking on: September 13, 2021   torsemide 20 MG tablet Commonly known as: DEMADEX Take 3 tablets (60 mg total) by mouth 2 (two) times daily. What changed: See the new instructions.   warfarin 2.5 MG tablet Commonly known as: COUMADIN Take as directed. If you are unsure how to take this medication, talk to your nurse or doctor. Original instructions: TAKE 7.5 MG ON M-W-F AND 5 MG TUES, THURS, SAT AND SUN AS DIRECTED What changed: See the new instructions.               Discharge Care Instructions  (From admission, onward)           Start     Ordered   09/12/21 0000  Discharge wound care:       Comments: Clean LLE wounds with saline, pat dry.  Apply silver Hydrofiber (Aquacel AG+)  cut to fit on wound bed, top with silicone foam, change Unna boots twice a week   09/12/21 1126           Allergies  Allergen Reactions   Morphine And Related Rash    Follow-up Information     [facility] Follow up on 09/25/2021.   Specialty: Cardiology Why: at 1200. Contact information: [address] [id number] mc [address] [place]        [name], MD Follow up.   Specialty: Internal Medicine Contact information: [address] [place] [address] [phone]                  The results of significant diagnostics from this hospitalization (including imaging, microbiology, ancillary and laboratory) are listed below for reference.    Significant Diagnostic Studies: DG CHEST PORT 1 VIEW  Result Date: 09/10/2021 CLINICAL DATA:  Chest pain radiating to the left side in left arm EXAM: PORTABLE CHEST 1 VIEW COMPARISON:  09/08/2021 FINDINGS: Single lead pacer remains in place. The patient is rotated to the left on today's radiograph, reducing diagnostic sensitivity and specificity. Very prominent left pulmonary artery suspicious for pulmonary  arterial hypertension. Atherosclerotic calcification of the aortic arch. Mild enlargement of the cardiopericardial silhouette, without edema. IMPRESSION: 1. Very prominent main pulmonary artery favoring pulmonary arterial hypertension. 2.  Aortic Atherosclerosis (ICD10-I70.0). 3. Mild enlargement of the cardiopericardial silhouette, without overt edema. 4. Single lead pacer remains in place. 5. The patient is rotated to the left on today's radiograph, reducing diagnostic sensitivity and specificity. Electronically Signed   By: Van Clines M.D.   On: 09/10/2021 11:59   ECHOCARDIOGRAM COMPLETE  Result Date: 09/09/2021    ECHOCARDIOGRAM REPORT   Patient Name:   EVALYNE CORTOPASSI Date of Exam: 09/09/2021 Medical Rec #:  [id number]        Height:       63.0 in Accession #:    [id number]       Weight:       272.0 lb Date of Birth:  Jun 21, 1955        BSA:          2.204 m Patient Age:    66 years         BP:           152/67 mmHg Patient Gender: F                HR:           73 bpm. Exam Location:  Inpatient Procedure: 2D Echo, Cardiac Doppler and Color Doppler Indications:    CHF-Acute Diastolic Y19.50  History:        Patient has prior history of Echocardiogram examinations, most                 recent 12/27/2018. Pacemaker, COPD, Arrythmias:Complete heart                 block, Atrial Fibrillation and LBBB; Risk Factors:Hypertension,                 Diabetes, Dyslipidemia, Sleep Apnea and Former Smoker. GERD.                 Chronic kidney disease.  Sonographer:    Bernadene Person RDCS Referring Phys: [id number] [place]  1. Left ventricular ejection fraction, by estimation, is 60 to 65%. The left ventricle has normal function. The left ventricle has  no regional wall motion abnormalities. There is moderate concentric left ventricular hypertrophy. Left ventricular diastolic parameters are indeterminate. Elevated left ventricular end-diastolic pressure.  2. Right ventricular systolic function is  normal. The right ventricular size is normal. Tricuspid regurgitation signal is inadequate for assessing PA pressure.  3. Left atrial size was mildly dilated.  4. Right atrial size was mildly dilated.  5. The mitral valve is normal in structure. Trivial mitral valve regurgitation. No evidence of mitral stenosis. Moderate to severe mitral annular calcification.  6. The aortic valve is tricuspid. Aortic valve regurgitation is mild to moderate. Aortic valve sclerosis/calcification is present, without any evidence of aortic stenosis.  7. The inferior vena cava is normal in size with greater than 50% respiratory variability, suggesting right atrial pressure of 3 mmHg. FINDINGS  Left Ventricle: Left ventricular ejection fraction, by estimation, is 60 to 65%. The left ventricle has normal function. The left ventricle has no regional wall motion abnormalities. The left ventricular internal cavity size was normal in size. There is  moderate concentric left ventricular hypertrophy. Left ventricular diastolic function could not be evaluated due to mitral annular calcification (moderate or greater). Left ventricular diastolic parameters are indeterminate. Elevated left ventricular end-diastolic pressure. Right Ventricle: The right ventricular size is normal. No increase in right ventricular wall thickness. Right ventricular systolic function is normal. Tricuspid regurgitation signal is inadequate for assessing PA pressure. The tricuspid regurgitant velocity is 2.49 m/s, and with an assumed right atrial pressure of 3 mmHg, the estimated right ventricular systolic pressure is 51.7 mmHg. Left Atrium: Left atrial size was mildly dilated. Right Atrium: Right atrial size was mildly dilated. Pericardium: There is no evidence of pericardial effusion. Mitral Valve: The mitral valve is normal in structure. Moderate to severe mitral annular calcification. Trivial mitral valve regurgitation. No evidence of mitral valve stenosis. Tricuspid  Valve: The tricuspid valve is normal in structure. Tricuspid valve regurgitation is mild . No evidence of tricuspid stenosis. Aortic Valve: The aortic valve is tricuspid. Aortic valve regurgitation is mild to moderate. Aortic regurgitation PHT measures 438 msec. Aortic valve sclerosis/calcification is present, without any evidence of aortic stenosis. Pulmonic Valve: The pulmonic valve was normal in structure. Pulmonic valve regurgitation is not visualized. No evidence of pulmonic stenosis. Aorta: The aortic root is normal in size and structure. Venous: The inferior vena cava is normal in size with greater than 50% respiratory variability, suggesting right atrial pressure of 3 mmHg. IAS/Shunts: No atrial level shunt detected by color flow Doppler. Additional Comments: A device lead is visualized.  LEFT VENTRICLE PLAX 2D LVIDd:         5.10 cm LVIDs:         3.30 cm LV PW:         1.60 cm LV IVS:        1.40 cm LVOT diam:     2.10 cm LV SV:         90 LV SV Index:   41 LVOT Area:     3.46 cm  LV Volumes (MOD) LV vol d, MOD A2C: 121.0 ml LV vol d, MOD A4C: 86.2 ml LV vol s, MOD A2C: 51.7 ml LV vol s, MOD A4C: 32.9 ml LV SV MOD A2C:     69.3 ml LV SV MOD A4C:     86.2 ml LV SV MOD BP:      63.2 ml RIGHT VENTRICLE RV S prime:     13.60 cm/s TAPSE (M-mode): 1.9 cm LEFT ATRIUM  Index        RIGHT ATRIUM           Index LA diam:        5.00 cm 2.27 cm/m   RA Area:     21.30 cm LA Vol (A2C):   62.8 ml 28.50 ml/m  RA Volume:   60.40 ml  27.41 ml/m LA Vol (A4C):   87.3 ml 39.62 ml/m LA Biplane Vol: 74.9 ml 33.99 ml/m  AORTIC VALVE                   PULMONIC VALVE LVOT Vmax:         127.00 cm/s PR End Diast Vel: 10.37 msec LVOT Vmean:        85.567 cm/s LVOT VTI:          0.261 m AI PHT:            438 msec AR Vena Contracta: 0.30 cm  AORTA Ao Root diam: 3.30 cm Ao Asc diam:  3.50 cm TRICUSPID VALVE TR Peak grad:   24.8 mmHg TR Vmax:        249.00 cm/s  SHUNTS Systemic VTI:  0.26 m Systemic Diam: 2.10 cm  Kardie Tobb DO Electronically signed by Berniece Salines DO Signature Date/Time: 09/09/2021/1:49:42 PM    Final    DG Chest 2 View  Result Date: 09/08/2021 CLINICAL DATA:  sob EXAM: CHEST - 2 VIEW COMPARISON:  December 26, 2018 FINDINGS: The cardiomediastinal silhouette is unchanged and enlarged in contour.Atherosclerotic calcifications. LEFT chest cardiac pacing device. No pleural effusion. No pneumothorax. Perihilar vascular fullness, bronchial wall cuffing and diffuse interstitial prominence. Visualized abdomen is unremarkable. Multilevel degenerative changes of the thoracic spine. IMPRESSION: Constellation of findings are favored to reflect pulmonary edema. Electronically Signed   By: Valentino Saxon M.D.   On: 09/08/2021 20:35   CUP PACEART REMOTE DEVICE CHECK  Result Date: 08/28/2021 Scheduled remote reviewed. Normal device function.  Next remote 91 days. Kathy Breach, RN, CCDS, [employer]   Microbiology: Recent Results (from the past 240 hour(s))  SARS Coronavirus 2 by RT PCR (hospital order, performed in [facility] hospital lab) *cepheid single result test* Anterior Nasal Swab     Status: None   Collection Time: 09/09/21 12:12 AM   Specimen: Anterior Nasal Swab  Result Value Ref Range Status   SARS Coronavirus 2 by RT PCR NEGATIVE NEGATIVE Final    Comment: (NOTE) SARS-CoV-2 target nucleic acids are NOT DETECTED.  The SARS-CoV-2 RNA is generally detectable in upper and lower respiratory specimens during the acute phase of infection. The lowest concentration of SARS-CoV-2 viral copies this assay can detect is 250 copies / mL. A negative result does not preclude SARS-CoV-2 infection and should not be used as the sole basis for treatment or other patient management decisions.  A negative result may occur with improper specimen collection / handling, submission of specimen other than nasopharyngeal swab, presence of viral mutation(s) within the areas targeted by this assay, and  inadequate number of viral copies (<250 copies / mL). A negative result must be combined with clinical observations, patient history, and epidemiological information.  Fact Sheet for Patients:   https://www.patel.info/  Fact Sheet for Healthcare Providers: https://hall.com/  This test is not yet approved or  cleared by the [place] FDA and has been authorized for detection and/or diagnosis of SARS-CoV-2 by FDA under an Emergency Use Authorization (EUA).  This EUA will remain in effect (meaning this test can  be used) for the duration of the COVID-19 declaration under Section 564(b)(1) of the Act, 21 U.S.C. section 360bbb-3(b)(1), unless the authorization is terminated or revoked sooner.  Performed at [facility], [place] [address], [place], [place] [address]      Labs: Basic Metabolic Panel: Recent Labs  Lab 09/08/21 1944 09/09/21 0435 09/10/21 0452 09/11/21 0450 09/12/21 0351  NA 143 144 142 138 136  K 3.6 3.2* 3.9 4.8 4.8  CL 106 106 105 97* 95*  CO2 29 30 31  32 30  GLUCOSE 188* 147* 98 107* 94  BUN 32* 32* 32* 35* 49*  CREATININE 1.33* 1.26* 1.26* 1.43* 1.55*  CALCIUM 8.9 8.9 9.1 9.2 9.1  MG  --  1.8  --  2.4 2.4   Liver Function Tests: Recent Labs  Lab 09/08/21 1944 09/09/21 0435  AST 27 24  ALT 28 25  ALKPHOS 72 62  BILITOT 0.5 1.2  PROT 6.0* 6.0*  ALBUMIN 3.4* 3.4*   No results for input(s): "LIPASE", "AMYLASE" in the last 168 hours. No results for input(s): "AMMONIA" in the last 168 hours. CBC: Recent Labs  Lab 09/08/21 1944 09/09/21 0435  WBC 5.5 5.8  NEUTROABS  --  4.1  HGB 10.4* 10.2*  HCT 33.0* 32.4*  MCV 106.5* 104.5*  PLT 141* 136*   Cardiac Enzymes: No results for input(s): "CKTOTAL", "CKMB", "CKMBINDEX", "TROPONINI" in the last 168 hours. BNP: BNP (last 3 results) Recent Labs    09/08/21 1944  BNP 490.2*    ProBNP (last 3 results) No results for input(s): "PROBNP" in the  last 8760 hours.  CBG: Recent Labs  Lab 09/11/21 1055 09/11/21 1652 09/11/21 2118 09/12/21 0558 09/12/21 1131  GLUCAP 114* 137* 113* 99 120*       Signed:  Domenic Polite MD.  [facility] 09/12/2021, 11:42 AM

## 2021-09-25 ENCOUNTER — Encounter (HOSPITAL_COMMUNITY): Payer: Medicare HMO

## 2021-10-11 ENCOUNTER — Other Ambulatory Visit (HOSPITAL_COMMUNITY): Payer: Self-pay

## 2021-10-25 ENCOUNTER — Ambulatory Visit (HOSPITAL_COMMUNITY)
Admission: RE | Admit: 2021-10-25 | Discharge: 2021-10-25 | Disposition: A | Discharge status: Home / Self Care | Payer: Medicare HMO | Source: Ambulatory Visit | Attending: Family Medicine | Admitting: Family Medicine

## 2021-10-25 ENCOUNTER — Encounter (HOSPITAL_COMMUNITY): Payer: Self-pay

## 2021-10-25 ENCOUNTER — Telehealth (HOSPITAL_COMMUNITY): Payer: Self-pay | Admitting: Surgery

## 2021-10-25 VITALS — BP 102/60 | HR 61 | Wt 267.8 lb

## 2021-10-25 DIAGNOSIS — I5033 Acute on chronic diastolic (congestive) heart failure: Secondary | ICD-10-CM | POA: Insufficient documentation

## 2021-10-25 DIAGNOSIS — I4891 Unspecified atrial fibrillation: Secondary | ICD-10-CM | POA: Diagnosis not present

## 2021-10-25 DIAGNOSIS — I4821 Permanent atrial fibrillation: Secondary | ICD-10-CM

## 2021-10-25 DIAGNOSIS — Z86711 Personal history of pulmonary embolism: Secondary | ICD-10-CM | POA: Diagnosis not present

## 2021-10-25 DIAGNOSIS — I3481 Nonrheumatic mitral (valve) annulus calcification: Secondary | ICD-10-CM | POA: Insufficient documentation

## 2021-10-25 DIAGNOSIS — G4733 Obstructive sleep apnea (adult) (pediatric): Secondary | ICD-10-CM

## 2021-10-25 DIAGNOSIS — I5032 Chronic diastolic (congestive) heart failure: Secondary | ICD-10-CM | POA: Diagnosis not present

## 2021-10-25 DIAGNOSIS — Z7901 Long term (current) use of anticoagulants: Secondary | ICD-10-CM | POA: Insufficient documentation

## 2021-10-25 DIAGNOSIS — I441 Atrioventricular block, second degree: Secondary | ICD-10-CM | POA: Diagnosis not present

## 2021-10-25 DIAGNOSIS — Z79899 Other long term (current) drug therapy: Secondary | ICD-10-CM | POA: Diagnosis not present

## 2021-10-25 DIAGNOSIS — Z79890 Hormone replacement therapy (postmenopausal): Secondary | ICD-10-CM | POA: Insufficient documentation

## 2021-10-25 DIAGNOSIS — Z794 Long term (current) use of insulin: Secondary | ICD-10-CM | POA: Diagnosis not present

## 2021-10-25 DIAGNOSIS — E039 Hypothyroidism, unspecified: Secondary | ICD-10-CM

## 2021-10-25 DIAGNOSIS — N1831 Chronic kidney disease, stage 3a: Secondary | ICD-10-CM | POA: Diagnosis not present

## 2021-10-25 DIAGNOSIS — R42 Dizziness and giddiness: Secondary | ICD-10-CM | POA: Diagnosis present

## 2021-10-25 DIAGNOSIS — I130 Hypertensive heart and chronic kidney disease with heart failure and stage 1 through stage 4 chronic kidney disease, or unspecified chronic kidney disease: Secondary | ICD-10-CM | POA: Insufficient documentation

## 2021-10-25 DIAGNOSIS — Z86718 Personal history of other venous thrombosis and embolism: Secondary | ICD-10-CM | POA: Insufficient documentation

## 2021-10-25 DIAGNOSIS — E669 Obesity, unspecified: Secondary | ICD-10-CM | POA: Insufficient documentation

## 2021-10-25 DIAGNOSIS — N183 Chronic kidney disease, stage 3 unspecified: Secondary | ICD-10-CM

## 2021-10-25 DIAGNOSIS — E1122 Type 2 diabetes mellitus with diabetic chronic kidney disease: Secondary | ICD-10-CM | POA: Diagnosis not present

## 2021-10-25 LAB — BASIC METABOLIC PANEL
Anion gap: 9 (ref 5–15)
BUN: 55 mg/dL — ABNORMAL HIGH (ref 8–23)
CO2: 28 mmol/L (ref 22–32)
Calcium: 9.1 mg/dL (ref 8.9–10.3)
Chloride: 102 mmol/L (ref 98–111)
Creatinine, Ser: 1.9 mg/dL — ABNORMAL HIGH (ref 0.44–1.00)
GFR, Estimated: 29 mL/min — ABNORMAL LOW (ref >60)
Glucose, Bld: 90 mg/dL (ref 70–99)
Potassium: 5 mmol/L (ref 3.5–5.1)
Sodium: 139 mmol/L (ref 135–145)

## 2021-10-25 LAB — BRAIN NATRIURETIC PEPTIDE: B Natriuretic Peptide: 286.5 pg/mL — ABNORMAL HIGH (ref 0.0–100.0)

## 2021-10-25 MED ORDER — LOSARTAN POTASSIUM 25 MG PO TABS: 25.0000 mg | ORAL_TABLET | Freq: Every day | ORAL | 6 refills | Status: DC

## 2021-10-25 NOTE — Progress Notes (Signed)
Advanced Heart Failure Clinic Note    PCP: [name], MD HF Cardiologist: Dr Haroldine Laws   HPI: [name] is a 66 y.o. female w/ h/o obesity, HTN, DM, atrial fib, DVT, PE x3 , s/p IVC filter placed in 2013, CHB s/p MDT single chamber PPM 08/2018, and chronic diastolic heart failure. In 2013, she had sleep study with mild sleep apnea noted.   R/LHC in 2016 showed no significant coronary disease, cardiac output 8.9 and PA pressured 41/13.     In January 2020 she had presyncopal episode and had some intermittent dizziness.  She told her PCP and was instructed to keep a log. In May she had presyncope and a syncopal episode. She had a fall off her porch. This was determined to be in the setting of CHB. MDT single chamber PPM placed. She also had minimally displaced C6 fracture.  Admitted 9/20  with acute on chronic diastolic HF. Started on IV diuretics. Echo showed EF 65-70% w/ pseudonormalization of LV consistent with diastolic dysfunction. She was diuresed w/ Lasix. RHC completed with preserved cardiac output and low filling pressures. PYP scan not convincing for amyloid. Outpatient sleep study recommended. EP saw 9/22 reprogrammed to VVI 50 due to asynchronous V pacing.  She had repeat sleep study 02/25/19 and found to have moderate OSA. No device yet. This is being followed by Dr. Radford Pax. She has f/u visit on 04/26/19.   Last seen by Dr Haroldine Laws in 2021. At  that time she was doing pretty good and continued on torsemide 60/40. She has not been seen since that time. Says she has been depressed since her husband passed away and put off calling the office.    Admitted 6/23 with A/C HFpEF, suspect in the setting of high sodium diet. Diuresed with IV lasix, eventually transitioned to GDMT. No SGLT2i with concern over hygiene/incontinence. Discharged home, weight 262 lbs.  Today she returns for HF follow up. Overall feeling fatigued. Chronically dizzy since neck fracture years ago, but has  been worse recently. Feels she has some fluid on her, has not weighed in the last couple of days. She has DOE, but generally does well with ADLs and walking short distances on flat ground.  Denies palpitations, CP, or PND/Orthopnea. Appetite ok. No fever or chills. Unable to tolerate CPAP. Taking all medications.    Cardiac Studies: - Echo (6/23): EF 60-65%, moderate LVH, normal RV, moderate to severe mitral calcification  - Echo (9/20): EF 65-70%, moderate LVH, normal RV  Past Medical History:  Diagnosis Date   A-fib ([place])    Anemia    Anticoagulated on Coumadin, chronically 09/03/2011   Anxiety    Arthritis    "right hip; both knees; left wrist/shoulder; back" (01/19/2013"   Bleeding on Coumadin 08/2012; 01/18/2013   BRBPR admissions (01/19/2013)   CHF (congestive heart failure) ([place])    "2-3 times" (01/19/2013)   Chronic lower back pain    Depression    DVT (deep venous thrombosis) (HCC) 10 years ago   numerous/notes 01/18/2013   GERD (gastroesophageal reflux disease)    Gout    Headache(784.0)    "maybe weekly" (01/19/2013)   Heart murmur    High cholesterol    "been off RX for this at one time" (01/18/2013)   History of blood transfusion 1983; 04/2012   "3 w/ childbirth; hospitalized for pain" (01/19/2013)   Hypertension    Hypothyroidism    Migraines    "twice/yr maybe" (01/19/2013)   Obstructive sleep apnea 05/03/2012  OSA (obstructive sleep apnea)    "sent me for test in 04/2012; never ordered mask, etc" (01/19/2013)   PE (pulmonary thromboembolism) ([place]) 3 years ag0   3/notes 01/18/2013   Pneumonia before 2011   "once' (01/18/2013)   Renal disorder    kindey function low; "Metformin was destroying my kidneys" (01/19/2013)   Shortness of breath    "only related to my CHF" (01/18/2013)   Swelling of hand 08/31/2014   RT HAND   Type II diabetes mellitus (HCC)    UTI (urinary tract infection) 08/31/2014   Current Outpatient Medications  Medication Sig Dispense  Refill   acetaminophen (TYLENOL) 500 MG tablet Take 500 mg by mouth every 6 (six) hours as needed for fever or headache (pain).     allopurinol (ZYLOPRIM) 300 MG tablet Take 300 mg by mouth daily.     Biotin 1000 MCG tablet Take 500 mcg by mouth daily with lunch.      citalopram (CELEXA) 20 MG tablet Take 20 mg by mouth at bedtime.     insulin aspart (NOVOLOG) 100 UNIT/ML injection Inject 6 Units into the skin 3 (three) times daily with meals.     insulin detemir (LEVEMIR) 100 UNIT/ML FlexPen Inject 18 Units into the skin daily before breakfast.     levothyroxine (SYNTHROID) 75 MCG tablet Take 75 mcg by mouth daily before breakfast.      losartan (COZAAR) 50 MG tablet Take 50 mg by mouth daily.     Melatonin 5 MG TABS Take 10 mg by mouth at bedtime.      Multiple Vitamin (MULITIVITAMIN WITH MINERALS) TABS Take 1 tablet by mouth daily.     potassium chloride SA (KLOR-CON M) 20 MEQ tablet Take 1 tablet (20 mEq total) by mouth daily. Absolute last refill without office visit please call [phone] to schedule 30 tablet 0   rosuvastatin (CRESTOR) 10 MG tablet Take 1 tablet (10 mg total) by mouth daily. 30 tablet 0   Semaglutide,0.25 or 0.5MG /DOS, (OZEMPIC, 0.25 OR 0.5 MG/DOSE,) 2 MG/1.5ML SOPN Inject 0.25 mg into the skin once a week.     spironolactone (ALDACTONE) 25 MG tablet Take 12.5 mg by mouth at bedtime.     torsemide (DEMADEX) 20 MG tablet Take 3 tablets (60 mg total) by mouth 2 (two) times daily. 200 tablet 0   warfarin (COUMADIN) 2.5 MG tablet TAKE 7.5 MG ON M-W-F AND 5 MG TUES, THURS, SAT AND SUN AS DIRECTED (Patient taking differently: Take 5 mg by mouth daily. Pt taking 5 mg six days per week, and 7 mg on the seventh day.) 120 tablet 1   No current facility-administered medications for this encounter.   Allergies  Allergen Reactions   Morphine And Related Rash   Social History   Socioeconomic History   Marital status: Single    Spouse name: Not on file   Number of children:  Not on file   Years of education: Not on file   Highest education level: Not on file  Occupational History   Occupation: disabled  Tobacco Use   Smoking status: Former    Packs/day: 0.05    Years: 30.00    Total pack years: 1.50    Types: Cigarettes    Quit date: 87    Years since quitting: 33.5   Smokeless tobacco: Never   Tobacco comments:    01/19/2013 "quit smoking cigarettes in the early '90's"  Substance and Sexual Activity   Alcohol use: Not Currently    Comment:  rare now but never a heavy drinker   Drug use: No   Sexual activity: Never  Other Topics Concern   Not on file  Social History Narrative   Not on file   Social Determinants of Health   Financial Resource Strain: Not on file  Food Insecurity: Not on file  Transportation Needs: Not on file  Physical Activity: Not on file  Stress: Not on file  Social Connections: Not on file  Intimate Partner Violence: Not on file   Family History  Problem Relation Age of Onset   Cerebral aneurysm Mother    Hypertension Father    Cerebral aneurysm Maternal Grandfather    Cerebral aneurysm Maternal Aunt    Cancer Maternal Uncle    BP 102/60   Pulse 61   Wt 121.5 kg (267 lb 12.8 oz)   SpO2 97%   BMI 47.44 kg/m   Wt Readings from Last 3 Encounters:  10/25/21 121.5 kg (267 lb 12.8 oz)  09/12/21 119 kg (262 lb 5.6 oz)  10/05/19 127.5 kg (281 lb)   PHYSICAL EXAM: General:  NAD. No resp difficulty, walked into clinic with cane HEENT: Normal Neck: Supple. Thick neck. Carotids 2+ bilat; no bruits. No lymphadenopathy or thryomegaly appreciated. Cor: PMI nondisplaced. Irregular rate & rhythm. No rubs, gallops or murmurs. Lungs: Clear Abdomen: Obese, soft, nontender, nondistended. No hepatosplenomegaly. No bruits or masses. Good bowel sounds. Extremities: No cyanosis, clubbing, rash, trace BLE edema, venous stasis changes to BLE Neuro: Alert & oriented x 3, cranial nerves grossly intact. Moves all 4 extremities w/o  difficulty. Affect pleasant.  ECG (personally reviewed): atrial fibrillation, 62 bpm; QRS 162 msec  REDs: 37%  Device interrogation (personally reviewed): v-pacing 18%, 4.6 hrs/day activity  ASSESSMENT & PLAN: 1. Chronic Diastolic Heart Failure: - Echo (9/19): EF 65-70% , moderately increased LVH.  - RHC (12/2018): with preserved cardiac output and low filling pressures.  - PYP scan not convincing for amyloid.  - Echo (6/23): EF 60-65% RV normal. - NYHA II-early III, functional status confounded by body habitus. Volume looks OK on exam. - With increased dizziness and marginal  BP, decrease losartan to 25 mg and change to q hs. (hesitant switching to [facility] with low diastolic pressures) - Continue torsemide 60 mg bid. - Continue spironolactone 12.5 mg qhs. - Would avoid SGLT2i w/ concern about hygeine/urinary incontinence.  - Discussed low salt food choices and limiting fluid intake.  - May be a good Cardiomems placement, she is interested in starting the process. She has watched the video. - Labs today.   2. CKD Stage IIIa - Creatinine baseline ~1.5-1.7 - Labs today   3. H/o CHB  - s/p  MDT PPM - Device interrogation as above.   4. DMII - Hgb A1C 6 - No SGLT2i with concern for UTI risk   5. Hypothyroidism  - On levothyroxine.    6. H/o PEs - On Coumadin . - INR followed by Coumadin Clinic.   7. OSA  - Unable to tolerate CPAP mask straps. - Refer back to Dr. Radford Pax to discuss options.   8. Atrial fibrillation - Chronic, was in AF in 2021.  - Rate controlled. - Continue Coumadin   Follow up in 3-4 months with Dr. Haroldine Laws.  [place], FNP-BC. 10/25/21

## 2021-10-25 NOTE — Progress Notes (Signed)
ReDS Vest / Clip - 10/25/21 1200       ReDS Vest / Clip   Station Marker B    Ruler Value 37    ReDS Value Range Moderate volume overload    ReDS Actual Value 37

## 2021-10-25 NOTE — Telephone Encounter (Signed)
I attempted to reach patient regarding results and recommendations per provider.  She is aware to stop Potassium supplement and medlist updated in [facility].  She will return August 4th for repeat labwork.

## 2021-10-25 NOTE — Patient Instructions (Signed)
DECREASE Losartan to 25 mg one tab nightly at bedtime  Labs today We will only contact you if something comes back abnormal or we need to make some changes. Otherwise no news is good news!  You have been referred to [facility] with Dr Radford Pax -they will be in contact with an appointment  The [place] consists of a small pressure-sensing device that is implanted directly into your pulmonary artery. Once implanted, the sensor measures and transmits your blood flow pressure and heart rate. -once approved with your insurance we will be in contact to arrange implant  Your physician wants you to follow-up in: 3-4 months with Dr Haroldine Laws You will receive a reminder letter in the mail two months in advance. If you don't receive a letter, please call our office to schedule the follow-up appointment.   Do the following things EVERYDAY: Weigh yourself in the morning before breakfast. Write it down and keep it in a log. Take your medicines as prescribed Eat low salt foods--Limit salt (sodium) to 2000 mg per day.  Stay as active as you can everyday Limit all fluids for the day to less than 2 liters  At the [facility], you and your health needs are our priority. As part of our continuing mission to provide you with exceptional heart care, we have created designated Provider Care Teams. These Care Teams include your primary Cardiologist (physician) and Advanced Practice Providers (APPs- Physician Assistants and Nurse Practitioners) who all work together to provide you with the care you need, when you need it.   You may see any of the following providers on your designated Care Team at your next follow up: Dr Glori Bickers Dr Haynes Kerns, NP Lyda Jester, [place] [facility] [place], [place] Audry Riles, PharmD   Please be sure to bring in all your medications bottles to every appointment.   If you have any questions or concerns before your  next appointment please send [place] a message through [place] or call our office at [phone].    TO LEAVE A MESSAGE FOR THE NURSE SELECT OPTION 2, PLEASE LEAVE A MESSAGE INCLUDING: YOUR NAME DATE OF BIRTH CALL BACK NUMBER REASON FOR CALL**this is important as we prioritize the call backs  YOU WILL RECEIVE A CALL BACK THE SAME DAY AS LONG AS YOU CALL BEFORE 4:00 PM

## 2021-10-25 NOTE — Telephone Encounter (Signed)
-----   Message from Rafael Bihari, [place] sent at 10/25/2021  2:40 PM EDT ----- K is high/normal. Stop KCL supplement.  SCr mildly elevated but we reduced losartan today so it should trend down.   She needs repeat BMET in 2 weeks

## 2021-11-08 ENCOUNTER — Other Ambulatory Visit (HOSPITAL_COMMUNITY): Payer: Medicare HMO

## 2021-11-11 ENCOUNTER — Ambulatory Visit (HOSPITAL_COMMUNITY)
Admission: RE | Admit: 2021-11-11 | Discharge: 2021-11-11 | Disposition: A | Discharge status: Home / Self Care | Payer: Medicare HMO | Source: Ambulatory Visit | Attending: Internal Medicine | Admitting: Internal Medicine

## 2021-11-11 DIAGNOSIS — I5032 Chronic diastolic (congestive) heart failure: Secondary | ICD-10-CM | POA: Diagnosis present

## 2021-11-11 LAB — BASIC METABOLIC PANEL
Anion gap: 8 (ref 5–15)
BUN: 44 mg/dL — ABNORMAL HIGH (ref 8–23)
CO2: 29 mmol/L (ref 22–32)
Calcium: 9.3 mg/dL (ref 8.9–10.3)
Chloride: 105 mmol/L (ref 98–111)
Creatinine, Ser: 1.83 mg/dL — ABNORMAL HIGH (ref 0.44–1.00)
GFR, Estimated: 30 mL/min — ABNORMAL LOW (ref >60)
Glucose, Bld: 140 mg/dL — ABNORMAL HIGH (ref 70–99)
Potassium: 4.5 mmol/L (ref 3.5–5.1)
Sodium: 142 mmol/L (ref 135–145)

## 2021-11-27 ENCOUNTER — Ambulatory Visit (INDEPENDENT_AMBULATORY_CARE_PROVIDER_SITE_OTHER): Payer: Medicare HMO

## 2021-11-27 DIAGNOSIS — I447 Left bundle-branch block, unspecified: Secondary | ICD-10-CM | POA: Diagnosis not present

## 2021-11-28 LAB — CUP PACEART REMOTE DEVICE CHECK
Battery Remaining Longevity: 143 mo
Battery Voltage: 3.03 V
Brady Statistic RV Percent Paced: 18.45 %
Date Time Interrogation Session: 20230823043927
Implantable Lead Implant Date: 20200526
Implantable Lead Location: 753860
Implantable Lead Model: 5076
Implantable Pulse Generator Implant Date: 20200526
Lead Channel Impedance Value: 361 Ohm
Lead Channel Impedance Value: 399 Ohm
Lead Channel Pacing Threshold Amplitude: 0.5 V
Lead Channel Pacing Threshold Pulse Width: 0.4 ms
Lead Channel Sensing Intrinsic Amplitude: 13.25 mV
Lead Channel Sensing Intrinsic Amplitude: 13.25 mV
Lead Channel Setting Pacing Amplitude: 2.5 V
Lead Channel Setting Pacing Pulse Width: 0.4 ms
Lead Channel Setting Sensing Sensitivity: 2.8 mV

## 2021-12-04 ENCOUNTER — Telehealth (HOSPITAL_COMMUNITY): Payer: Self-pay | Admitting: Cardiology

## 2021-12-04 NOTE — Telephone Encounter (Signed)
Cardio mems application (including enrollment, appeal letter,medical of necessity and records)completed and faxed to [employer].  Will await insurance results

## 2021-12-23 ENCOUNTER — Emergency Department (HOSPITAL_COMMUNITY): Payer: Medicare HMO

## 2021-12-23 ENCOUNTER — Emergency Department (HOSPITAL_COMMUNITY)
Admission: EM | Admit: 2021-12-23 | Discharge: 2021-12-24 | Discharge status: Against Medical Advice | Payer: Medicare HMO | Attending: Emergency Medicine | Admitting: Emergency Medicine

## 2021-12-23 ENCOUNTER — Encounter (HOSPITAL_COMMUNITY): Payer: Self-pay | Admitting: Emergency Medicine

## 2021-12-23 ENCOUNTER — Other Ambulatory Visit: Payer: Self-pay

## 2021-12-23 DIAGNOSIS — R059 Cough, unspecified: Secondary | ICD-10-CM | POA: Diagnosis not present

## 2021-12-23 DIAGNOSIS — R509 Fever, unspecified: Secondary | ICD-10-CM | POA: Insufficient documentation

## 2021-12-23 DIAGNOSIS — Z7901 Long term (current) use of anticoagulants: Secondary | ICD-10-CM | POA: Insufficient documentation

## 2021-12-23 DIAGNOSIS — R0602 Shortness of breath: Secondary | ICD-10-CM | POA: Insufficient documentation

## 2021-12-23 DIAGNOSIS — R079 Chest pain, unspecified: Secondary | ICD-10-CM | POA: Insufficient documentation

## 2021-12-23 DIAGNOSIS — R112 Nausea with vomiting, unspecified: Secondary | ICD-10-CM | POA: Insufficient documentation

## 2021-12-23 DIAGNOSIS — Z5321 Procedure and treatment not carried out due to patient leaving prior to being seen by health care provider: Secondary | ICD-10-CM | POA: Insufficient documentation

## 2021-12-23 LAB — CBC WITH DIFFERENTIAL/PLATELET
Abs Immature Granulocytes: 0.02 10*3/uL (ref 0.00–0.07)
Basophils Absolute: 0 10*3/uL (ref 0.0–0.1)
Basophils Relative: 0 %
Eosinophils Absolute: 0 10*3/uL (ref 0.0–0.5)
Eosinophils Relative: 1 %
HCT: 36.4 % (ref 36.0–46.0)
Hemoglobin: 11.6 g/dL — ABNORMAL LOW (ref 12.0–15.0)
Immature Granulocytes: 0 %
Lymphocytes Relative: 5 %
Lymphs Abs: 0.2 10*3/uL — ABNORMAL LOW (ref 0.7–4.0)
MCH: 33.4 pg (ref 26.0–34.0)
MCHC: 31.9 g/dL (ref 30.0–36.0)
MCV: 104.9 fL — ABNORMAL HIGH (ref 80.0–100.0)
Monocytes Absolute: 0.6 10*3/uL (ref 0.1–1.0)
Monocytes Relative: 13 %
Neutro Abs: 3.8 10*3/uL (ref 1.7–7.7)
Neutrophils Relative %: 81 %
Platelets: 112 10*3/uL — ABNORMAL LOW (ref 150–400)
RBC: 3.47 MIL/uL — ABNORMAL LOW (ref 3.87–5.11)
RDW: 14.8 % (ref 11.5–15.5)
WBC: 4.7 10*3/uL (ref 4.0–10.5)
nRBC: 0 % (ref 0.0–0.2)

## 2021-12-23 LAB — PROTIME-INR
INR: 2.5 — ABNORMAL HIGH (ref 0.8–1.2)
Prothrombin Time: 26.6 seconds — ABNORMAL HIGH (ref 11.4–15.2)

## 2021-12-23 LAB — COMPREHENSIVE METABOLIC PANEL
ALT: 15 U/L (ref 0–44)
AST: 22 U/L (ref 15–41)
Albumin: 3.8 g/dL (ref 3.5–5.0)
Alkaline Phosphatase: 74 U/L (ref 38–126)
Anion gap: 14 (ref 5–15)
BUN: 45 mg/dL — ABNORMAL HIGH (ref 8–23)
CO2: 22 mmol/L (ref 22–32)
Calcium: 9.3 mg/dL (ref 8.9–10.3)
Chloride: 100 mmol/L (ref 98–111)
Creatinine, Ser: 1.69 mg/dL — ABNORMAL HIGH (ref 0.44–1.00)
GFR, Estimated: 33 mL/min — ABNORMAL LOW (ref >60)
Glucose, Bld: 128 mg/dL — ABNORMAL HIGH (ref 70–99)
Potassium: 4 mmol/L (ref 3.5–5.1)
Sodium: 136 mmol/L (ref 135–145)
Total Bilirubin: 0.9 mg/dL (ref 0.3–1.2)
Total Protein: 6.5 g/dL (ref 6.5–8.1)

## 2021-12-23 LAB — LIPASE, BLOOD: Lipase: 31 U/L (ref 11–51)

## 2021-12-23 LAB — BRAIN NATRIURETIC PEPTIDE: B Natriuretic Peptide: 628.8 pg/mL — ABNORMAL HIGH (ref 0.0–100.0)

## 2021-12-23 NOTE — ED Triage Notes (Addendum)
Per EMS, Pt states she is not feeling well, difficulty taking deep breaths, N/v all day.  Pt reports a pacemaker (not actively pacing her.)  97% RA 140/82 70pulse  Pt now says she has a weight on her chest.

## 2021-12-23 NOTE — ED Provider Triage Note (Signed)
Emergency Medicine Provider Triage Evaluation Note  [name] , a 66 y.o. female  was evaluated in triage.  Pt complains of chest pain, shortness of breath, nausea vomiting, patient is short of breath chronically due to her congestive heart failure she notes that she is having a productive cough, subjective fevers and chills.m she also notes that she is having stomach pains nausea and vomiting, started today, unable to tolerate p.o., still passing gas, last bowel movement was yesterday, she currently on warfarin, she was told by her physician that her blood was too thin and she is not taking her warfarin since Friday. .  Review of Systems  Positive: Stomach pain, nausea Negative: Pleuritic chest pain, diarrhea  Physical Exam  BP (!) 151/73 (BP Location: Left Wrist)   Pulse 79   Temp 98.3 F (36.8 C) (Oral)   Resp 19   Ht 5\' 3"  (1.6 m)   Wt 112.5 kg   SpO2 97%   BMI 43.93 kg/m  Gen:   Awake, no distress   Resp:  Normal effort  MSK:   Moves extremities without difficulty  Other:    Medical Decision Making  Medically screening exam initiated at 10:46 PM.  Appropriate orders placed.  Rita Vialpando Dufault was informed that the remainder of the evaluation will be completed by another provider, this initial triage assessment does not replace that evaluation, and the importance of remaining in the ED until their evaluation is complete.  Lab work imaging have been ordered will need further work-up.   Marcello Fennel, PA-C 12/23/21 2248

## 2021-12-24 NOTE — ED Notes (Signed)
Patient leaving d/t wait time 

## 2021-12-25 NOTE — Progress Notes (Signed)
Remote pacemaker transmission.   

## 2022-02-18 ENCOUNTER — Ambulatory Visit: Payer: Medicare HMO | Admitting: Cardiology

## 2022-02-26 ENCOUNTER — Ambulatory Visit (INDEPENDENT_AMBULATORY_CARE_PROVIDER_SITE_OTHER): Payer: Medicare HMO

## 2022-02-26 DIAGNOSIS — I447 Left bundle-branch block, unspecified: Secondary | ICD-10-CM | POA: Diagnosis not present

## 2022-02-26 LAB — CUP PACEART REMOTE DEVICE CHECK
Battery Remaining Longevity: 140 mo
Battery Voltage: 3.03 V
Brady Statistic RV Percent Paced: 17.85 %
Date Time Interrogation Session: 20231122020206
Implantable Lead Connection Status: 753985
Implantable Lead Implant Date: 20200526
Implantable Lead Location: 753860
Implantable Lead Model: 5076
Implantable Pulse Generator Implant Date: 20200526
Lead Channel Impedance Value: 304 Ohm
Lead Channel Impedance Value: 380 Ohm
Lead Channel Pacing Threshold Amplitude: 0.5 V
Lead Channel Pacing Threshold Pulse Width: 0.4 ms
Lead Channel Sensing Intrinsic Amplitude: 11.625 mV
Lead Channel Sensing Intrinsic Amplitude: 11.625 mV
Lead Channel Setting Pacing Amplitude: 2.5 V
Lead Channel Setting Pacing Pulse Width: 0.4 ms
Lead Channel Setting Sensing Sensitivity: 2.8 mV
Zone Setting Status: 755011

## 2022-03-20 NOTE — Progress Notes (Signed)
Remote pacemaker transmission.   

## 2022-05-15 ENCOUNTER — Encounter (HOSPITAL_BASED_OUTPATIENT_CLINIC_OR_DEPARTMENT_OTHER): Payer: Medicare HMO | Admitting: General Surgery

## 2022-05-27 ENCOUNTER — Encounter (HOSPITAL_COMMUNITY): Payer: Medicare HMO

## 2022-05-28 ENCOUNTER — Ambulatory Visit (INDEPENDENT_AMBULATORY_CARE_PROVIDER_SITE_OTHER): Payer: Medicare HMO

## 2022-05-28 DIAGNOSIS — I447 Left bundle-branch block, unspecified: Secondary | ICD-10-CM

## 2022-05-28 LAB — CUP PACEART REMOTE DEVICE CHECK
Battery Remaining Longevity: 138 mo
Battery Voltage: 3.03 V
Brady Statistic RV Percent Paced: 19.08 %
Date Time Interrogation Session: 20240220224910
Implantable Lead Connection Status: 753985
Implantable Lead Implant Date: 20200526
Implantable Lead Location: 753860
Implantable Lead Model: 5076
Implantable Pulse Generator Implant Date: 20200526
Lead Channel Impedance Value: 342 Ohm
Lead Channel Impedance Value: 418 Ohm
Lead Channel Pacing Threshold Amplitude: 0.625 V
Lead Channel Pacing Threshold Pulse Width: 0.4 ms
Lead Channel Sensing Intrinsic Amplitude: 11.25 mV
Lead Channel Sensing Intrinsic Amplitude: 11.25 mV
Lead Channel Setting Pacing Amplitude: 2.5 V
Lead Channel Setting Pacing Pulse Width: 0.4 ms
Lead Channel Setting Sensing Sensitivity: 2.8 mV
Zone Setting Status: 755011

## 2022-06-19 ENCOUNTER — Other Ambulatory Visit (HOSPITAL_COMMUNITY): Payer: Self-pay | Admitting: Family Medicine

## 2022-06-26 NOTE — Progress Notes (Signed)
Remote pacemaker transmission.   

## 2022-08-27 ENCOUNTER — Ambulatory Visit (INDEPENDENT_AMBULATORY_CARE_PROVIDER_SITE_OTHER): Payer: Medicare HMO

## 2022-08-27 DIAGNOSIS — I441 Atrioventricular block, second degree: Secondary | ICD-10-CM

## 2022-08-27 DIAGNOSIS — I5032 Chronic diastolic (congestive) heart failure: Secondary | ICD-10-CM

## 2022-08-27 LAB — CUP PACEART REMOTE DEVICE CHECK
Battery Remaining Longevity: 134 mo
Battery Voltage: 3.02 V
Brady Statistic RV Percent Paced: 28.71 %
Date Time Interrogation Session: 20240522003336
Implantable Lead Connection Status: 753985
Implantable Lead Implant Date: 20200526
Implantable Lead Location: 753860
Implantable Lead Model: 5076
Implantable Pulse Generator Implant Date: 20200526
Lead Channel Impedance Value: 342 Ohm
Lead Channel Impedance Value: 399 Ohm
Lead Channel Pacing Threshold Amplitude: 0.5 V
Lead Channel Pacing Threshold Pulse Width: 0.4 ms
Lead Channel Sensing Intrinsic Amplitude: 11.625 mV
Lead Channel Sensing Intrinsic Amplitude: 11.625 mV
Lead Channel Setting Pacing Amplitude: 2.5 V
Lead Channel Setting Pacing Pulse Width: 0.4 ms
Lead Channel Setting Sensing Sensitivity: 2.8 mV
Zone Setting Status: 755011

## 2022-08-27 NOTE — Telephone Encounter (Signed)
Appeal submitted 12/17/2021 and appeal denied 01/01/2022 Unable to proceed with process at this time

## 2022-09-10 ENCOUNTER — Other Ambulatory Visit: Payer: Self-pay | Admitting: Nephrology

## 2022-09-10 DIAGNOSIS — I5030 Unspecified diastolic (congestive) heart failure: Secondary | ICD-10-CM

## 2022-09-10 DIAGNOSIS — N1832 Chronic kidney disease, stage 3b: Secondary | ICD-10-CM

## 2022-09-10 DIAGNOSIS — R809 Proteinuria, unspecified: Secondary | ICD-10-CM

## 2022-09-10 DIAGNOSIS — N2581 Secondary hyperparathyroidism of renal origin: Secondary | ICD-10-CM

## 2022-09-10 DIAGNOSIS — D631 Anemia in chronic kidney disease: Secondary | ICD-10-CM

## 2022-09-10 DIAGNOSIS — I129 Hypertensive chronic kidney disease with stage 1 through stage 4 chronic kidney disease, or unspecified chronic kidney disease: Secondary | ICD-10-CM

## 2022-09-10 DIAGNOSIS — N189 Chronic kidney disease, unspecified: Secondary | ICD-10-CM

## 2022-09-10 DIAGNOSIS — I878 Other specified disorders of veins: Secondary | ICD-10-CM

## 2022-09-15 ENCOUNTER — Ambulatory Visit
Admission: RE | Admit: 2022-09-15 | Discharge: 2022-09-15 | Disposition: A | Discharge status: Home / Self Care | Payer: Medicare HMO | Source: Ambulatory Visit | Attending: Nephrology | Admitting: Nephrology

## 2022-09-15 DIAGNOSIS — N189 Chronic kidney disease, unspecified: Secondary | ICD-10-CM

## 2022-09-15 DIAGNOSIS — N1832 Chronic kidney disease, stage 3b: Secondary | ICD-10-CM

## 2022-09-15 DIAGNOSIS — R809 Proteinuria, unspecified: Secondary | ICD-10-CM

## 2022-09-15 DIAGNOSIS — N2581 Secondary hyperparathyroidism of renal origin: Secondary | ICD-10-CM

## 2022-09-15 DIAGNOSIS — I5030 Unspecified diastolic (congestive) heart failure: Secondary | ICD-10-CM

## 2022-09-15 DIAGNOSIS — I878 Other specified disorders of veins: Secondary | ICD-10-CM

## 2022-09-15 DIAGNOSIS — I129 Hypertensive chronic kidney disease with stage 1 through stage 4 chronic kidney disease, or unspecified chronic kidney disease: Secondary | ICD-10-CM

## 2022-09-18 NOTE — Progress Notes (Signed)
Remote pacemaker transmission.   

## 2022-09-22 ENCOUNTER — Other Ambulatory Visit (HOSPITAL_COMMUNITY): Payer: Self-pay | Admitting: Internal Medicine

## 2022-10-07 ENCOUNTER — Other Ambulatory Visit: Payer: Self-pay | Admitting: Nephrology

## 2022-10-07 DIAGNOSIS — N2889 Other specified disorders of kidney and ureter: Secondary | ICD-10-CM

## 2022-11-26 ENCOUNTER — Ambulatory Visit (INDEPENDENT_AMBULATORY_CARE_PROVIDER_SITE_OTHER): Payer: Medicare HMO

## 2022-11-26 DIAGNOSIS — I447 Left bundle-branch block, unspecified: Secondary | ICD-10-CM | POA: Diagnosis not present

## 2022-11-26 LAB — CUP PACEART REMOTE DEVICE CHECK
Battery Remaining Longevity: 131 mo
Battery Voltage: 3.02 V
Brady Statistic RV Percent Paced: 35.05 %
Date Time Interrogation Session: 20240820203523
Implantable Lead Connection Status: 753985
Implantable Lead Implant Date: 20200526
Implantable Lead Location: 753860
Implantable Lead Model: 5076
Implantable Pulse Generator Implant Date: 20200526
Lead Channel Impedance Value: 361 Ohm
Lead Channel Impedance Value: 418 Ohm
Lead Channel Pacing Threshold Amplitude: 0.5 V
Lead Channel Pacing Threshold Pulse Width: 0.4 ms
Lead Channel Sensing Intrinsic Amplitude: 12.25 mV
Lead Channel Sensing Intrinsic Amplitude: 12.25 mV
Lead Channel Setting Pacing Amplitude: 2.5 V
Lead Channel Setting Pacing Pulse Width: 0.4 ms
Lead Channel Setting Sensing Sensitivity: 2.8 mV
Zone Setting Status: 755011

## 2022-12-10 NOTE — Progress Notes (Signed)
Remote pacemaker transmission.   

## 2022-12-17 ENCOUNTER — Other Ambulatory Visit: Payer: Self-pay | Admitting: *Deleted

## 2022-12-17 DIAGNOSIS — I872 Venous insufficiency (chronic) (peripheral): Secondary | ICD-10-CM

## 2022-12-24 ENCOUNTER — Encounter (HOSPITAL_COMMUNITY): Payer: Medicare HMO

## 2022-12-24 ENCOUNTER — Encounter: Payer: Medicare HMO | Admitting: Vascular Surgery

## 2022-12-30 ENCOUNTER — Ambulatory Visit: Payer: Medicare HMO | Admitting: Physician Assistant

## 2023-01-07 ENCOUNTER — Telehealth: Payer: Self-pay

## 2023-01-07 ENCOUNTER — Other Ambulatory Visit (INDEPENDENT_AMBULATORY_CARE_PROVIDER_SITE_OTHER): Payer: Medicare HMO

## 2023-01-07 ENCOUNTER — Ambulatory Visit (INDEPENDENT_AMBULATORY_CARE_PROVIDER_SITE_OTHER): Payer: Medicare HMO | Admitting: Physician Assistant

## 2023-01-07 ENCOUNTER — Encounter: Payer: Self-pay | Admitting: Physician Assistant

## 2023-01-07 DIAGNOSIS — M25561 Pain in right knee: Secondary | ICD-10-CM

## 2023-01-07 DIAGNOSIS — M25552 Pain in left hip: Secondary | ICD-10-CM | POA: Diagnosis not present

## 2023-01-07 NOTE — Progress Notes (Signed)
Office Visit Note   Patient: [name]           Date of Birth: 1955-10-25           MRN: [id number] Visit Date: 01/07/2023              Requested by: Leah Ok, MD [id number] [place],  [place] [address] PCP: Leah Ok, MD   Assessment & Plan: Visit Diagnoses:  1. Right knee pain, unspecified chronicity   2. Pain of left hip   3. Pain in left hip     Plan: [name] is a pleasant 67 year old woman with multiple comorbidities.  She has a history of arthritis of both of her knees her back and her left hip.  She has had reaggravation of her left hip pain and her right knee pain.  Denies any particular injuries recently.  With regards to the right knee she has advanced degenerative changes actually of both knees.  She has gotten some relief with injections before.  She cannot do oral anti-inflammatories because of her kidney disease and she is on chronic Coumadin therapy for blood clots.  I will go forward with an injection into her right knee today.  With regards to her left hip I think today what bothers her most is over the trochanteric bursa.  Bothers her when she lifts her leg up she cannot sleep on the side at night and has focal tenderness over the Trope bursa.  Does not seem to have any groin symptoms today but I tell her that I cannot be sure that some of this may not be coming from the hip itself.  She is having at home PT come to her house.  I recommended therapy for her knees and her hip and her back which has been a problem in the past.  I will refer her to see if Dr. Shon [name] could do a steroid injection into her left trochanteric bursa.  I explained to her that this would both be diagnostic and hopefully therapeutic.  Follow-Up Instructions: No follow-ups on file.  Orders:  Orders Placed This Encounter  Procedures   XR KNEE 3 VIEW RIGHT   XR HIP UNILAT W OR W/O PELVIS 2-3 VIEWS LEFT   No orders of the defined types were placed in this encounter.      Procedures: No procedures performed   Clinical Data: No additional findings.   Subjective: Chief Complaint  Patient presents with   Right Knee - Pain   Left Hip - Pain    HPI patient is a pleasant 67 year old woman with multiple comorbidities and a history of osteoarthritis of both of her knees and her left hip and her back.  She has treated this conservatively in the past before.  She has had no injury but she is complaining today of significant pain over the lateral side of her hip especially when she lays on it at night forcing her to lay on her back.  Also has some recurring left knee pain.  Denies any acute back pain or calf pain or groin pain.  She cannot take anti-inflammatories because she has chronic kidney disease and is on blood thinners for recurrent PEs.  Has tried topical medication which has not been helpful.  She is having a home health PT come to her house but has not been given specific directions  Review of Systems  All other systems reviewed and are negative.    Objective: Vital Signs: There were no vitals  taken for this visit.  Physical Exam Constitutional:      Appearance: Normal appearance.  Pulmonary:     Effort: Pulmonary effort is normal.  Skin:    General: Skin is warm and dry.  Neurological:     General: No focal deficit present.     Mental Status: She is alert.     Ortho Exam patient is sitting comfortably in a chair she does use a walker as an assistive device.  She is neurovascularly intact no pain in her groin no specific pain in her back or radicular findings.  Her right knee she has no effusion no erythema she is tender over the medial greater than lateral joint line good stability.  Left hip no real significant pain with manipulation but does have pain focally over the trochanteric bursa neurovascular intact Specialty Comments:  No specialty comments available.  Imaging: No results found.   PMFS History: Patient Active Problem List    Diagnosis Date Noted   Pain in left hip 01/07/2023   Pain in right knee 01/07/2023   Acute on chronic heart failure with preserved ejection fraction (HFpEF) (HCC) 09/10/2021   Type 2 diabetes mellitus with stage 3b chronic kidney disease, with long-term current use of insulin (HCC) 09/09/2021   Mixed diabetic hyperlipidemia associated with type 2 diabetes mellitus (HCC) 09/09/2021   Hypothyroidism 09/09/2021   Acute cardiogenic pulmonary edema (HCC) 09/09/2021   Wound of left leg, initial encounter 09/09/2021   C6 cervical fracture (HCC) 09/01/2018   Atrial flutter (HCC) 09/01/2018   Paroxysmal atrial fibrillation (HCC) 09/01/2018   Syncope 08/29/2018   Symptomatic advanced heart block 08/29/2018   Acute on chronic diastolic CHF (congestive heart failure) (HCC)    COPD (chronic obstructive pulmonary disease) (HCC) 09/12/2015   Personal history of endocrine, metabolic or immunity disorder 81/19/1478   Decreased mobility 04/03/2015   Deep vein thrombosis (DVT) of both lower extremities (HCC) 02/24/2015   Chronic kidney disease, stage 3b (HCC) 02/24/2015   Long term current use of anticoagulant therapy 11/19/2014   Osteoarthritis 08/31/2014   Cellulitis of hand, right 08/31/2014   Tremors  05/07/2012   Obstructive sleep apnea 05/03/2012   Anticoagulated on Coumadin, chronically 09/03/2011   Thrombocytopenia, this admission, 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 08/13/2011   History of pulmonary embolism, recurrent, 3 seperate episodes. 08/13/2011   Presence of IVC filter, placed June 2011 after 3d PE 08/13/2011   Pulmonary HTN, Moderate  PA pressure 09/2009 08/13/2011   Past Medical History:   Diagnosis Date   A-fib (HCC)    Anemia    Anticoagulated on Coumadin, chronically 09/03/2011   Anxiety    Arthritis    "right hip; both knees; left wrist/shoulder; back" (01/19/2013"   Bleeding on Coumadin 08/2012; 01/18/2013   BRBPR admissions (01/19/2013)   CHF (congestive heart failure) (HCC)    "2-3 times" (01/19/2013)   Chronic lower back pain    Depression    DVT (deep venous thrombosis) (HCC) 10 years ago   numerous/notes 01/18/2013   GERD (gastroesophageal reflux disease)    Gout    Headache(784.0)    "maybe weekly" (01/19/2013)   Heart murmur    High cholesterol    "  been off RX for this at one time" (01/18/2013)   History of blood transfusion 1983; 04/2012   "3 w/ childbirth; hospitalized for pain" (01/19/2013)   Hypertension    Hypothyroidism    Migraines    "twice/yr maybe" (01/19/2013)   Obstructive sleep apnea 05/03/2012   OSA (obstructive sleep apnea)    "sent me for test in 04/2012; never ordered mask, etc" (01/19/2013)   PE (pulmonary thromboembolism) (HCC) 3 years ag0   3/notes 01/18/2013   Pneumonia before 2011   "once' (01/18/2013)   Renal disorder    kindey function low; "Metformin was destroying my kidneys" (01/19/2013)   Shortness of breath    "only related to my CHF" (01/18/2013)   Swelling of hand 08/31/2014   RT HAND   Type II diabetes mellitus (HCC)    UTI (urinary tract infection) 08/31/2014    Family History  Problem Relation Age of Onset   Cerebral aneurysm Mother    Hypertension Father    Cerebral aneurysm Maternal Grandfather    Cerebral aneurysm Maternal Aunt    Cancer Maternal Uncle     Past Surgical History:  Procedure Laterality Date   CARDIAC CATHETERIZATION N/A 03/13/2015   Procedure: Right/Left Heart Cath and Coronary Angiography;  Surgeon: Yates Decamp, MD;  Location: [facility] INVASIVE CV LAB;  Service: Cardiovascular;  Laterality: N/A;   CATARACT EXTRACTION W/ INTRAOCULAR LENS  IMPLANT, BILATERAL Bilateral 2006-2011   CESAREAN SECTION   1983   CHOLECYSTECTOMY  ~ 2002   COLONOSCOPY N/A 01/21/2013   Procedure: COLONOSCOPY;  Surgeon: Theda Belfast, MD;  Location[facility] ENDOSCOPY;  Service: Endoscopy;  Laterality: N/A;   EYE SURGERY Bilateral    "multiple" (01/18/2013)   PACEMAKER IMPLANT N/A 08/31/2018   Symptomatic bradycardia due to mobitz II second degree AV block, permanent afib/ atypical atrial flutter implanted by Dr Johney Frame   PARS PLANA REPAIR OF RETINAL DEATACHMENT Right    PARS PLANA VITRECTOMY Bilateral 2004-2006   "several" (01/18/2013)   REFRACTIVE SURGERY Bilateral    "for stigmatism" (01/18/2013)   REFRACTIVE SURGERY Left ~ 11/2012   "to puff it up cause vision got hazy" (01/18/2013)   RIGHT HEART CATH N/A 12/30/2018   Procedure: RIGHT HEART CATH;  Surgeon: Dolores Patty, MD;  Location: [facility] INVASIVE CV LAB;  Service: Cardiovascular;  Laterality: N/A;   VENA CAVA FILTER PLACEMENT  2011?   Social History   Occupational History   Occupation: disabled  Tobacco Use   Smoking status: Former    Current packs/day: 0.00    Average packs/day: 0.1 packs/day for 30.0 years (1.5 ttl pk-yrs)    Types: Cigarettes    Start date: 41    Quit date: 52    Years since quitting: 34.7   Smokeless tobacco: Never   Tobacco comments:    01/19/2013 "quit smoking cigarettes in the early '90's"  Substance and Sexual Activity   Alcohol use: Not Currently    Comment: rare now but never a heavy drinker   Drug use: No   Sexual activity: Never

## 2023-01-07 NOTE — Telephone Encounter (Signed)
Dr. Mayford Knife wanting to get patient scheduled as PCP sent over notes that Patient is not currently using cpap. Patient states this is because the strap hurts her neck. Patient scheduled for 02/17/23.

## 2023-01-12 ENCOUNTER — Ambulatory Visit: Payer: Medicare HMO | Admitting: Sports Medicine

## 2023-01-16 ENCOUNTER — Ambulatory Visit: Payer: Medicare HMO | Admitting: Sports Medicine

## 2023-01-16 ENCOUNTER — Other Ambulatory Visit (HOSPITAL_COMMUNITY): Payer: Self-pay | Admitting: Family Medicine

## 2023-02-17 ENCOUNTER — Ambulatory Visit: Payer: Medicare HMO | Admitting: Cardiology

## 2023-02-24 ENCOUNTER — Ambulatory Visit (INDEPENDENT_AMBULATORY_CARE_PROVIDER_SITE_OTHER): Payer: Medicare HMO

## 2023-02-24 DIAGNOSIS — I447 Left bundle-branch block, unspecified: Secondary | ICD-10-CM

## 2023-02-24 DIAGNOSIS — I480 Paroxysmal atrial fibrillation: Secondary | ICD-10-CM

## 2023-02-24 LAB — CUP PACEART REMOTE DEVICE CHECK
Battery Remaining Longevity: 129 mo
Battery Voltage: 3.02 V
Brady Statistic RV Percent Paced: 27.11 %
Date Time Interrogation Session: 20241119054855
Implantable Lead Connection Status: 753985
Implantable Lead Implant Date: 20200526
Implantable Lead Location: 753860
Implantable Lead Model: 5076
Implantable Pulse Generator Implant Date: 20200526
Lead Channel Impedance Value: 342 Ohm
Lead Channel Impedance Value: 437 Ohm
Lead Channel Pacing Threshold Amplitude: 0.5 V
Lead Channel Pacing Threshold Pulse Width: 0.4 ms
Lead Channel Sensing Intrinsic Amplitude: 11.25 mV
Lead Channel Sensing Intrinsic Amplitude: 11.25 mV
Lead Channel Setting Pacing Amplitude: 2.5 V
Lead Channel Setting Pacing Pulse Width: 0.4 ms
Lead Channel Setting Sensing Sensitivity: 2.8 mV
Zone Setting Status: 755011

## 2023-02-27 ENCOUNTER — Other Ambulatory Visit: Payer: Self-pay | Admitting: Nephrology

## 2023-02-27 DIAGNOSIS — N1832 Chronic kidney disease, stage 3b: Secondary | ICD-10-CM

## 2023-03-03 ENCOUNTER — Other Ambulatory Visit: Payer: Medicare HMO

## 2023-03-11 ENCOUNTER — Ambulatory Visit
Admission: RE | Admit: 2023-03-11 | Discharge: 2023-03-11 | Disposition: A | Discharge status: Home / Self Care | Payer: Medicare HMO | Source: Ambulatory Visit | Attending: Nephrology | Admitting: Nephrology

## 2023-03-11 DIAGNOSIS — N1832 Chronic kidney disease, stage 3b: Secondary | ICD-10-CM

## 2023-03-23 NOTE — Progress Notes (Signed)
Remote pacemaker transmission.   

## 2023-03-24 ENCOUNTER — Ambulatory Visit (HOSPITAL_COMMUNITY): Payer: Medicare HMO

## 2023-03-28 LAB — LAB REPORT - SCANNED
Albumin, Urine POC: 125.4
Albumin/Creatinine Ratio, Urine, POC: 172
Creatinine, POC: 73 mg/dL
EGFR: 30

## 2023-05-25 NOTE — Progress Notes (Deleted)
 Office Note     CC:  Chronic venous stasis wounds  Requesting Provider:  Ralene Ok, MD  HPI: [name] is a 68 y.o. (Nov 03, 1955) female who presents at the request of Ralene Ok, MD for evaluation of ***.   Venous symptoms include: positive if (X) [  ] aching [  ] heavy [  ] tired  [  ] throbbing [  ] burning  [  ] itching [  ]swelling [  ] bleeding [  ] ulcer  Onset/duration:  ***  Occupation:  *** Aggravating factors: (sitting, standing) Alleviating factors: (elevation) Compression:  *** Helps:  *** Pain medications:  *** Previous vein procedures:  *** History of DVT:  ***   The pt *** on a statin for cholesterol management.  The pt *** on a daily aspirin.   Other AC:  *** The pt *** on *** for hypertension.   The pt *** diabetic.  *** Tobacco hx:  ***  Past Medical History:  Diagnosis Date   A-fib (HCC)    Anemia    Anticoagulated on Coumadin, chronically 09/03/2011   Anxiety    Arthritis    "right hip; both knees; left wrist/shoulder; back" (01/19/2013"   Bleeding on Coumadin 08/2012; 01/18/2013   [facility] admissions (01/19/2013)   CHF (congestive heart failure) (HCC)    "2-3 times" (01/19/2013)   Chronic lower back pain    Depression    DVT (deep venous thrombosis) (HCC) 10 years ago   numerous/notes 01/18/2013   GERD (gastroesophageal reflux disease)    Gout    Headache(784.0)    "maybe weekly" (01/19/2013)   Heart murmur    High cholesterol    "been off RX for this at one time" (01/18/2013)   History of blood transfusion 1983; 04/2012   "3 w/ childbirth; hospitalized for pain" (01/19/2013)   Hypertension    Hypothyroidism    Migraines    "twice/yr maybe" (01/19/2013)   Obstructive sleep apnea 05/03/2012   OSA (obstructive sleep apnea)    "sent me for test in 04/2012; never ordered mask, etc" (01/19/2013)   PE (pulmonary thromboembolism) (HCC) 3 years ag0   3/notes 01/18/2013   Pneumonia before 2011   "once' (01/18/2013)   Renal  disorder    kindey function low; "Metformin was destroying my kidneys" (01/19/2013)   Shortness of breath    "only related to my CHF" (01/18/2013)   Swelling of hand 08/31/2014   RT HAND   Type II diabetes mellitus (HCC)    UTI (urinary tract infection) 08/31/2014    Past Surgical History:  Procedure Laterality Date   CARDIAC CATHETERIZATION N/A 03/13/2015   Procedure: Right/Left Heart Cath and Coronary Angiography;  Surgeon: Yates Decamp, MD;  Location: [facility] INVASIVE CV LAB;  Service: Cardiovascular;  Laterality: N/A;   CATARACT EXTRACTION W/ INTRAOCULAR LENS  IMPLANT, BILATERAL Bilateral 2006-2011   CESAREAN SECTION  1983   CHOLECYSTECTOMY  ~ 2002   COLONOSCOPY N/A 01/21/2013   Procedure: COLONOSCOPY;  Surgeon: Theda Belfast, MD;  Location: [facility] ENDOSCOPY;  Service: Endoscopy;  Laterality: N/A;   EYE SURGERY Bilateral    "multiple" (01/18/2013)   PACEMAKER IMPLANT N/A 08/31/2018   Symptomatic bradycardia due to mobitz II second degree AV block, permanent afib/ atypical atrial flutter implanted by Dr Lynne Leader PLANA REPAIR OF RETINAL DEATACHMENT Right    PARS PLANA VITRECTOMY Bilateral 2004-2006   "several" (01/18/2013)   REFRACTIVE SURGERY Bilateral    "for stigmatism" (01/18/2013)   REFRACTIVE  SURGERY Left ~ 11/2012   "to puff it up cause vision got hazy" (01/18/2013)   RIGHT HEART CATH N/A 12/30/2018   Procedure: RIGHT HEART CATH;  Surgeon: Dolores Patty, MD;  Location: [facility] INVASIVE CV LAB;  Service: Cardiovascular;  Laterality: N/A;   VENA CAVA FILTER PLACEMENT  2011?    Social History   Socioeconomic History   Marital status: Single    Spouse name: Not on file   Number of children: Not on file   Years of education: Not on file   Highest education level: Not on file  Occupational History   Occupation: disabled  Tobacco Use   Smoking status: Former    Current packs/day: 0.00    Average packs/day: 0.1 packs/day for 30.0 years (1.5 ttl pk-yrs)    Types: Cigarettes     Start date: 45    Quit date: 5    Years since quitting: 35.1   Smokeless tobacco: Never   Tobacco comments:    01/19/2013 "quit smoking cigarettes in the early '90's"  Substance and Sexual Activity   Alcohol use: Not Currently    Comment: rare now but never a heavy drinker   Drug use: No   Sexual activity: Never  Other Topics Concern   Not on file  Social History Narrative   Not on file   Social Drivers of Health   Financial Resource Strain: Not on file  Food Insecurity: Not on file  Transportation Needs: Not on file  Physical Activity: Not on file  Stress: Not on file  Social Connections: Not on file  Intimate Partner Violence: Not on file   *** Family History  Problem Relation Age of Onset   Cerebral aneurysm Mother    Hypertension Father    Cerebral aneurysm Maternal Grandfather    Cerebral aneurysm Maternal Aunt    Cancer Maternal Uncle     Current Outpatient Medications  Medication Sig Dispense Refill   acetaminophen (TYLENOL) 500 MG tablet Take 500 mg by mouth every 6 (six) hours as needed for fever or headache (pain).     allopurinol (ZYLOPRIM) 300 MG tablet Take 300 mg by mouth daily.     Biotin 1000 MCG tablet Take 500 mcg by mouth daily with lunch.      citalopram (CELEXA) 20 MG tablet Take 20 mg by mouth at bedtime.     insulin aspart (NOVOLOG) 100 UNIT/ML injection Inject 6 Units into the skin 3 (three) times daily with meals.     insulin detemir (LEVEMIR) 100 UNIT/ML FlexPen Inject 18 Units into the skin daily before breakfast.     levothyroxine (SYNTHROID) 75 MCG tablet Take 75 mcg by mouth daily before breakfast.      losartan (COZAAR) 25 MG tablet Take 1 tablet (25 mg total) by mouth daily. NEEDS FOLLOW UP APPOINTMENT FOR MORE REFILLS 90 tablet 2   Melatonin 5 MG TABS Take 10 mg by mouth at bedtime.      Multiple Vitamin (MULITIVITAMIN WITH MINERALS) TABS Take 1 tablet by mouth daily.     rosuvastatin (CRESTOR) 10 MG tablet Take 1 tablet (10 mg  total) by mouth daily. 30 tablet 0   Semaglutide,0.25 or 0.5MG /DOS, (OZEMPIC, 0.25 OR 0.5 MG/DOSE,) 2 MG/1.5ML SOPN Inject 0.25 mg into the skin once a week.     spironolactone (ALDACTONE) 25 MG tablet Take 12.5 mg by mouth at bedtime.     torsemide (DEMADEX) 20 MG tablet Take 3 tablets (60 mg total) by mouth 2 (two) times  daily. 200 tablet 0   warfarin (COUMADIN) 2.5 MG tablet TAKE 7.5 MG ON M-W-F AND 5 MG TUES, THURS, SAT AND SUN AS DIRECTED (Patient taking differently: Take 5 mg by mouth daily. Pt taking 5 mg six days per week, and 7 mg on the seventh day.) 120 tablet 1   No current facility-administered medications for this visit.    Allergies  Allergen Reactions   Morphine And Codeine Rash     REVIEW OF SYSTEMS:  *** [X]  denotes positive finding, [ ]  denotes negative finding Cardiac  Comments:  Chest pain or chest pressure:    Shortness of breath upon exertion:    Short of breath when lying flat:    Irregular heart rhythm:        Vascular    Pain in calf, thigh, or hip brought on by ambulation:    Pain in feet at night that wakes you up from your sleep:     Blood clot in your veins:    Leg swelling:         Pulmonary    Oxygen at home:    Productive cough:     Wheezing:         Neurologic    Sudden weakness in arms or legs:     Sudden numbness in arms or legs:     Sudden onset of difficulty speaking or slurred speech:    Temporary loss of vision in one eye:     Problems with dizziness:         Gastrointestinal    Blood in stool:     Vomited blood:         Genitourinary    Burning when urinating:     Blood in urine:        Psychiatric    Major depression:         Hematologic    Bleeding problems:    Problems with blood clotting too easily:        Skin    Rashes or ulcers:        Constitutional    Fever or chills:      PHYSICAL EXAMINATION:  There were no vitals filed for this visit.  General:  WDWN in NAD; vital signs documented above Gait: Not  observed HENT: WNL, normocephalic Pulmonary: normal non-labored breathing , without Rales, rhonchi,  wheezing Cardiac: {Desc; regular/irreg:14544} HR, without  Murmurs {With/Without:20273} carotid bruit*** Abdomen: soft, NT, no masses Skin: {With/Without:20273} rashes Vascular Exam/Pulses:  Right Left  Radial {Exam; arterial pulse strength 0-4:30167} {Exam; arterial pulse strength 0-4:30167}  Ulnar {Exam; arterial pulse strength 0-4:30167} {Exam; arterial pulse strength 0-4:30167}  Femoral {Exam; arterial pulse strength 0-4:30167} {Exam; arterial pulse strength 0-4:30167}  Popliteal {Exam; arterial pulse strength 0-4:30167} {Exam; arterial pulse strength 0-4:30167}  DP {Exam; arterial pulse strength 0-4:30167} {Exam; arterial pulse strength 0-4:30167}  PT {Exam; arterial pulse strength 0-4:30167} {Exam; arterial pulse strength 0-4:30167}   Extremities: {With/Without:20273} ischemic changes, {With/Without:20273} Gangrene , {With/Without:20273} cellulitis; {With/Without:20273} open wounds;  Musculoskeletal: no muscle wasting or atrophy  Neurologic: A&O X 3;  No focal weakness or paresthesias are detected Psychiatric:  The pt has {Desc; normal/abnormal:11317::"Normal"} affect.   Non-Invasive Vascular Imaging:   ***    ASSESSMENT/PLAN:: 68 y.o. female presenting with ***   ***   Victorino Sparrow, MD [facility] [phone]

## 2023-05-26 ENCOUNTER — Ambulatory Visit (INDEPENDENT_AMBULATORY_CARE_PROVIDER_SITE_OTHER): Payer: Medicare HMO

## 2023-05-26 DIAGNOSIS — I447 Left bundle-branch block, unspecified: Secondary | ICD-10-CM | POA: Diagnosis not present

## 2023-05-27 LAB — CUP PACEART REMOTE DEVICE CHECK
Battery Remaining Longevity: 127 mo
Battery Voltage: 3.02 V
Brady Statistic RV Percent Paced: 23.82 %
Date Time Interrogation Session: 20250217234349
Implantable Lead Connection Status: 753985
Implantable Lead Implant Date: 20200526
Implantable Lead Location: 753860
Implantable Lead Model: 5076
Implantable Pulse Generator Implant Date: 20200526
Lead Channel Impedance Value: 342 Ohm
Lead Channel Impedance Value: 456 Ohm
Lead Channel Pacing Threshold Amplitude: 0.5 V
Lead Channel Pacing Threshold Pulse Width: 0.4 ms
Lead Channel Sensing Intrinsic Amplitude: 13.125 mV
Lead Channel Sensing Intrinsic Amplitude: 13.125 mV
Lead Channel Setting Pacing Amplitude: 2.5 V
Lead Channel Setting Pacing Pulse Width: 0.4 ms
Lead Channel Setting Sensing Sensitivity: 2.8 mV
Zone Setting Status: 755011

## 2023-05-28 ENCOUNTER — Ambulatory Visit: Payer: Medicare HMO | Admitting: Cardiology

## 2023-05-28 ENCOUNTER — Encounter: Payer: Medicare HMO | Admitting: Vascular Surgery

## 2023-05-28 ENCOUNTER — Ambulatory Visit (HOSPITAL_COMMUNITY): Payer: Medicare HMO

## 2023-06-19 LAB — LAB REPORT - SCANNED
Albumin, Urine POC: 338.1
Albumin/Creatinine Ratio, Urine, POC: 710
Creatinine, POC: 47.6 mg/dL
EGFR: 29

## 2023-07-02 NOTE — Progress Notes (Signed)
 Remote pacemaker transmission.

## 2023-07-02 NOTE — Addendum Note (Signed)
 Addended by: Elease Etienne A on: 07/02/2023 12:51 PM   Modules accepted: Orders

## 2023-07-17 ENCOUNTER — Ambulatory Visit: Payer: Medicare HMO | Admitting: Cardiology

## 2023-07-20 ENCOUNTER — Ambulatory Visit (HOSPITAL_COMMUNITY): Payer: Medicare HMO

## 2023-08-25 ENCOUNTER — Ambulatory Visit (INDEPENDENT_AMBULATORY_CARE_PROVIDER_SITE_OTHER): Payer: Medicare HMO

## 2023-08-25 DIAGNOSIS — I447 Left bundle-branch block, unspecified: Secondary | ICD-10-CM

## 2023-08-26 LAB — CUP PACEART REMOTE DEVICE CHECK
Battery Remaining Longevity: 122 mo
Battery Voltage: 3.01 V
Brady Statistic RV Percent Paced: 37.2 %
Date Time Interrogation Session: 20250520014952
Implantable Lead Connection Status: 753985
Implantable Lead Implant Date: 20200526
Implantable Lead Location: 753860
Implantable Lead Model: 5076
Implantable Pulse Generator Implant Date: 20200526
Lead Channel Impedance Value: 285 Ohm
Lead Channel Impedance Value: 380 Ohm
Lead Channel Pacing Threshold Amplitude: 0.625 V
Lead Channel Pacing Threshold Pulse Width: 0.4 ms
Lead Channel Sensing Intrinsic Amplitude: 16.125 mV
Lead Channel Sensing Intrinsic Amplitude: 16.125 mV
Lead Channel Setting Pacing Amplitude: 2.5 V
Lead Channel Setting Pacing Pulse Width: 0.4 ms
Lead Channel Setting Sensing Sensitivity: 2.8 mV
Zone Setting Status: 755011

## 2023-09-03 ENCOUNTER — Inpatient Hospital Stay (HOSPITAL_COMMUNITY)
Admission: EM | Admit: 2023-09-03 | Discharge: 2023-09-08 | DRG: 286 | Disposition: A | Discharge status: Home Health | Attending: Family Medicine | Admitting: Family Medicine

## 2023-09-03 ENCOUNTER — Ambulatory Visit: Payer: Self-pay | Admitting: Cardiovascular Disease

## 2023-09-03 ENCOUNTER — Emergency Department (HOSPITAL_COMMUNITY)

## 2023-09-03 ENCOUNTER — Encounter (HOSPITAL_COMMUNITY): Payer: Self-pay | Admitting: Internal Medicine

## 2023-09-03 ENCOUNTER — Other Ambulatory Visit: Payer: Self-pay

## 2023-09-03 DIAGNOSIS — Z95828 Presence of other vascular implants and grafts: Secondary | ICD-10-CM | POA: Diagnosis not present

## 2023-09-03 DIAGNOSIS — R7989 Other specified abnormal findings of blood chemistry: Secondary | ICD-10-CM | POA: Diagnosis present

## 2023-09-03 DIAGNOSIS — D649 Anemia, unspecified: Secondary | ICD-10-CM | POA: Diagnosis present

## 2023-09-03 DIAGNOSIS — Z6841 Body Mass Index (BMI) 40.0 and over, adult: Secondary | ICD-10-CM

## 2023-09-03 DIAGNOSIS — R0609 Other forms of dyspnea: Secondary | ICD-10-CM | POA: Diagnosis not present

## 2023-09-03 DIAGNOSIS — Z794 Long term (current) use of insulin: Secondary | ICD-10-CM | POA: Diagnosis not present

## 2023-09-03 DIAGNOSIS — I10 Essential (primary) hypertension: Secondary | ICD-10-CM | POA: Diagnosis not present

## 2023-09-03 DIAGNOSIS — J449 Chronic obstructive pulmonary disease, unspecified: Secondary | ICD-10-CM | POA: Diagnosis present

## 2023-09-03 DIAGNOSIS — E782 Mixed hyperlipidemia: Secondary | ICD-10-CM | POA: Diagnosis present

## 2023-09-03 DIAGNOSIS — I82412 Acute embolism and thrombosis of left femoral vein: Secondary | ICD-10-CM | POA: Diagnosis present

## 2023-09-03 DIAGNOSIS — E039 Hypothyroidism, unspecified: Secondary | ICD-10-CM | POA: Diagnosis present

## 2023-09-03 DIAGNOSIS — I2723 Pulmonary hypertension due to lung diseases and hypoxia: Secondary | ICD-10-CM

## 2023-09-03 DIAGNOSIS — K5900 Constipation, unspecified: Secondary | ICD-10-CM | POA: Diagnosis not present

## 2023-09-03 DIAGNOSIS — G4733 Obstructive sleep apnea (adult) (pediatric): Secondary | ICD-10-CM | POA: Diagnosis present

## 2023-09-03 DIAGNOSIS — R0602 Shortness of breath: Secondary | ICD-10-CM | POA: Diagnosis not present

## 2023-09-03 DIAGNOSIS — I441 Atrioventricular block, second degree: Secondary | ICD-10-CM | POA: Diagnosis present

## 2023-09-03 DIAGNOSIS — Z789 Other specified health status: Secondary | ICD-10-CM | POA: Diagnosis not present

## 2023-09-03 DIAGNOSIS — Z7189 Other specified counseling: Secondary | ICD-10-CM | POA: Diagnosis not present

## 2023-09-03 DIAGNOSIS — I272 Pulmonary hypertension, unspecified: Secondary | ICD-10-CM | POA: Diagnosis not present

## 2023-09-03 DIAGNOSIS — I493 Ventricular premature depolarization: Secondary | ICD-10-CM | POA: Diagnosis present

## 2023-09-03 DIAGNOSIS — Z79899 Other long term (current) drug therapy: Secondary | ICD-10-CM

## 2023-09-03 DIAGNOSIS — Z7409 Other reduced mobility: Secondary | ICD-10-CM | POA: Diagnosis not present

## 2023-09-03 DIAGNOSIS — Z515 Encounter for palliative care: Secondary | ICD-10-CM

## 2023-09-03 DIAGNOSIS — Z950 Presence of cardiac pacemaker: Secondary | ICD-10-CM | POA: Diagnosis not present

## 2023-09-03 DIAGNOSIS — I25110 Atherosclerotic heart disease of native coronary artery with unstable angina pectoris: Secondary | ICD-10-CM | POA: Diagnosis present

## 2023-09-03 DIAGNOSIS — I82432 Acute embolism and thrombosis of left popliteal vein: Secondary | ICD-10-CM | POA: Diagnosis present

## 2023-09-03 DIAGNOSIS — E1122 Type 2 diabetes mellitus with diabetic chronic kidney disease: Secondary | ICD-10-CM | POA: Diagnosis present

## 2023-09-03 DIAGNOSIS — F419 Anxiety disorder, unspecified: Secondary | ICD-10-CM | POA: Diagnosis present

## 2023-09-03 DIAGNOSIS — Z7901 Long term (current) use of anticoagulants: Secondary | ICD-10-CM

## 2023-09-03 DIAGNOSIS — Z9841 Cataract extraction status, right eye: Secondary | ICD-10-CM

## 2023-09-03 DIAGNOSIS — Z9842 Cataract extraction status, left eye: Secondary | ICD-10-CM

## 2023-09-03 DIAGNOSIS — J9601 Acute respiratory failure with hypoxia: Secondary | ICD-10-CM | POA: Diagnosis present

## 2023-09-03 DIAGNOSIS — I509 Heart failure, unspecified: Secondary | ICD-10-CM | POA: Diagnosis not present

## 2023-09-03 DIAGNOSIS — I878 Other specified disorders of veins: Secondary | ICD-10-CM | POA: Diagnosis present

## 2023-09-03 DIAGNOSIS — Z23 Encounter for immunization: Secondary | ICD-10-CM

## 2023-09-03 DIAGNOSIS — E662 Morbid (severe) obesity with alveolar hypoventilation: Secondary | ICD-10-CM

## 2023-09-03 DIAGNOSIS — Z66 Do not resuscitate: Secondary | ICD-10-CM | POA: Diagnosis present

## 2023-09-03 DIAGNOSIS — R791 Abnormal coagulation profile: Secondary | ICD-10-CM | POA: Diagnosis present

## 2023-09-03 DIAGNOSIS — I5033 Acute on chronic diastolic (congestive) heart failure: Secondary | ICD-10-CM | POA: Diagnosis present

## 2023-09-03 DIAGNOSIS — Z9049 Acquired absence of other specified parts of digestive tract: Secondary | ICD-10-CM

## 2023-09-03 DIAGNOSIS — E1169 Type 2 diabetes mellitus with other specified complication: Secondary | ICD-10-CM | POA: Diagnosis present

## 2023-09-03 DIAGNOSIS — Z86718 Personal history of other venous thrombosis and embolism: Secondary | ICD-10-CM | POA: Diagnosis not present

## 2023-09-03 DIAGNOSIS — Z885 Allergy status to narcotic agent status: Secondary | ICD-10-CM

## 2023-09-03 DIAGNOSIS — T829XXA Unspecified complication of cardiac and vascular prosthetic device, implant and graft, initial encounter: Secondary | ICD-10-CM

## 2023-09-03 DIAGNOSIS — I480 Paroxysmal atrial fibrillation: Secondary | ICD-10-CM | POA: Diagnosis present

## 2023-09-03 DIAGNOSIS — N179 Acute kidney failure, unspecified: Secondary | ICD-10-CM | POA: Diagnosis present

## 2023-09-03 DIAGNOSIS — Z87891 Personal history of nicotine dependence: Secondary | ICD-10-CM

## 2023-09-03 DIAGNOSIS — I351 Nonrheumatic aortic (valve) insufficiency: Secondary | ICD-10-CM | POA: Diagnosis present

## 2023-09-03 DIAGNOSIS — F32A Depression, unspecified: Secondary | ICD-10-CM | POA: Diagnosis present

## 2023-09-03 DIAGNOSIS — Z8249 Family history of ischemic heart disease and other diseases of the circulatory system: Secondary | ICD-10-CM

## 2023-09-03 DIAGNOSIS — N1832 Chronic kidney disease, stage 3b: Secondary | ICD-10-CM | POA: Diagnosis present

## 2023-09-03 DIAGNOSIS — I5A Non-ischemic myocardial injury (non-traumatic): Secondary | ICD-10-CM | POA: Diagnosis present

## 2023-09-03 DIAGNOSIS — I4821 Permanent atrial fibrillation: Secondary | ICD-10-CM | POA: Diagnosis present

## 2023-09-03 DIAGNOSIS — I5030 Unspecified diastolic (congestive) heart failure: Secondary | ICD-10-CM | POA: Insufficient documentation

## 2023-09-03 DIAGNOSIS — M109 Gout, unspecified: Secondary | ICD-10-CM | POA: Diagnosis present

## 2023-09-03 DIAGNOSIS — Q245 Malformation of coronary vessels: Secondary | ICD-10-CM

## 2023-09-03 DIAGNOSIS — Z86711 Personal history of pulmonary embolism: Secondary | ICD-10-CM | POA: Diagnosis not present

## 2023-09-03 DIAGNOSIS — Z79890 Hormone replacement therapy (postmenopausal): Secondary | ICD-10-CM

## 2023-09-03 DIAGNOSIS — R001 Bradycardia, unspecified: Secondary | ICD-10-CM | POA: Diagnosis present

## 2023-09-03 DIAGNOSIS — D61818 Other pancytopenia: Secondary | ICD-10-CM

## 2023-09-03 DIAGNOSIS — I130 Hypertensive heart and chronic kidney disease with heart failure and stage 1 through stage 4 chronic kidney disease, or unspecified chronic kidney disease: Principal | ICD-10-CM | POA: Diagnosis present

## 2023-09-03 DIAGNOSIS — Z961 Presence of intraocular lens: Secondary | ICD-10-CM | POA: Diagnosis present

## 2023-09-03 LAB — PROTIME-INR
INR: 1.7 — ABNORMAL HIGH (ref 0.8–1.2)
Prothrombin Time: 19.9 s — ABNORMAL HIGH (ref 11.4–15.2)

## 2023-09-03 LAB — I-STAT VENOUS BLOOD GAS, ED
Acid-Base Excess: 3 mmol/L — ABNORMAL HIGH (ref 0.0–2.0)
Bicarbonate: 27.2 mmol/L (ref 20.0–28.0)
Calcium, Ion: 1.1 mmol/L — ABNORMAL LOW (ref 1.15–1.40)
HCT: 37 % (ref 36.0–46.0)
Hemoglobin: 12.6 g/dL (ref 12.0–15.0)
O2 Saturation: 78 %
Potassium: 4.2 mmol/L (ref 3.5–5.1)
Sodium: 143 mmol/L (ref 135–145)
TCO2: 28 mmol/L (ref 22–32)
pCO2, Ven: 39.2 mmHg — ABNORMAL LOW (ref 44–60)
pH, Ven: 7.449 — ABNORMAL HIGH (ref 7.25–7.43)
pO2, Ven: 40 mmHg (ref 32–45)

## 2023-09-03 LAB — BRAIN NATRIURETIC PEPTIDE: B Natriuretic Peptide: 455.1 pg/mL — ABNORMAL HIGH (ref 0.0–100.0)

## 2023-09-03 LAB — COMPREHENSIVE METABOLIC PANEL WITH GFR
ALT: 24 U/L (ref 0–44)
AST: 32 U/L (ref 15–41)
Albumin: 4 g/dL (ref 3.5–5.0)
Alkaline Phosphatase: 58 U/L (ref 38–126)
Anion gap: 16 — ABNORMAL HIGH (ref 5–15)
BUN: 44 mg/dL — ABNORMAL HIGH (ref 8–23)
CO2: 23 mmol/L (ref 22–32)
Calcium: 9.4 mg/dL (ref 8.9–10.3)
Chloride: 103 mmol/L (ref 98–111)
Creatinine, Ser: 1.69 mg/dL — ABNORMAL HIGH (ref 0.44–1.00)
GFR, Estimated: 33 mL/min — ABNORMAL LOW (ref >60)
Glucose, Bld: 172 mg/dL — ABNORMAL HIGH (ref 70–99)
Potassium: 4.2 mmol/L (ref 3.5–5.1)
Sodium: 142 mmol/L (ref 135–145)
Total Bilirubin: 1.3 mg/dL — ABNORMAL HIGH (ref 0.0–1.2)
Total Protein: 6.5 g/dL (ref 6.5–8.1)

## 2023-09-03 LAB — LIPID PANEL
Cholesterol: 105 mg/dL (ref 0–200)
HDL: 50 mg/dL (ref >40)
LDL Cholesterol: 44 mg/dL (ref 0–99)
Total CHOL/HDL Ratio: 2.1 ratio
Triglycerides: 56 mg/dL (ref <150)
VLDL: 11 mg/dL (ref 0–40)

## 2023-09-03 LAB — CBC WITH DIFFERENTIAL/PLATELET
Abs Immature Granulocytes: 0.03 10*3/uL (ref 0.00–0.07)
Basophils Absolute: 0 10*3/uL (ref 0.0–0.1)
Basophils Relative: 0 %
Eosinophils Absolute: 0.1 10*3/uL (ref 0.0–0.5)
Eosinophils Relative: 1 %
HCT: 39.1 % (ref 36.0–46.0)
Hemoglobin: 11.7 g/dL — ABNORMAL LOW (ref 12.0–15.0)
Immature Granulocytes: 0 %
Lymphocytes Relative: 7 %
Lymphs Abs: 0.5 10*3/uL — ABNORMAL LOW (ref 0.7–4.0)
MCH: 32.8 pg (ref 26.0–34.0)
MCHC: 29.9 g/dL — ABNORMAL LOW (ref 30.0–36.0)
MCV: 109.5 fL — ABNORMAL HIGH (ref 80.0–100.0)
Monocytes Absolute: 0.5 10*3/uL (ref 0.1–1.0)
Monocytes Relative: 7 %
Neutro Abs: 5.6 10*3/uL (ref 1.7–7.7)
Neutrophils Relative %: 85 %
Platelets: 122 10*3/uL — ABNORMAL LOW (ref 150–400)
RBC: 3.57 MIL/uL — ABNORMAL LOW (ref 3.87–5.11)
RDW: 14.8 % (ref 11.5–15.5)
WBC: 6.7 10*3/uL (ref 4.0–10.5)
nRBC: 0 % (ref 0.0–0.2)

## 2023-09-03 LAB — RESP PANEL BY RT-PCR (RSV, FLU A&B, COVID)  RVPGX2
Influenza A by PCR: NEGATIVE
Influenza B by PCR: NEGATIVE
Resp Syncytial Virus by PCR: NEGATIVE
SARS Coronavirus 2 by RT PCR: NEGATIVE

## 2023-09-03 LAB — MAGNESIUM: Magnesium: 2 mg/dL (ref 1.7–2.4)

## 2023-09-03 LAB — TROPONIN I (HIGH SENSITIVITY)
Troponin I (High Sensitivity): 123 ng/L (ref <18)
Troponin I (High Sensitivity): 131 ng/L (ref <18)

## 2023-09-03 LAB — GLUCOSE, CAPILLARY: Glucose-Capillary: 146 mg/dL — ABNORMAL HIGH (ref 70–99)

## 2023-09-03 MED ORDER — INSULIN GLARGINE-YFGN 100 UNIT/ML ~~LOC~~ SOLN
20.0000 [IU] | Freq: Every day | SUBCUTANEOUS | Status: DC
  Administered 2023-09-03 – 2023-09-08 (×6): 20 [IU] via SUBCUTANEOUS
  Filled 2023-09-03 (×7): qty 0.2

## 2023-09-03 MED ORDER — SODIUM CHLORIDE 0.9% FLUSH
3.0000 mL | Freq: Two times a day (BID) | INTRAVENOUS | Status: DC
  Administered 2023-09-04 – 2023-09-07 (×4): 3 mL via INTRAVENOUS

## 2023-09-03 MED ORDER — SODIUM CHLORIDE 0.9 % IV SOLN: 250.0000 mL | INTRAVENOUS | Status: AC | PRN

## 2023-09-03 MED ORDER — SODIUM CHLORIDE 0.9% FLUSH: 3.0000 mL | INTRAVENOUS | Status: DC | PRN

## 2023-09-03 MED ORDER — WARFARIN SODIUM 6 MG PO TABS
6.0000 mg | ORAL_TABLET | Freq: Once | ORAL | Status: DC
  Filled 2023-09-03: qty 1

## 2023-09-03 MED ORDER — INSULIN ASPART 100 UNIT/ML IJ SOLN
0.0000 [IU] | Freq: Three times a day (TID) | INTRAMUSCULAR | Status: DC
  Administered 2023-09-04: 1 [IU] via SUBCUTANEOUS
  Administered 2023-09-04 – 2023-09-05 (×2): 2 [IU] via SUBCUTANEOUS
  Administered 2023-09-05: 1 [IU] via SUBCUTANEOUS
  Administered 2023-09-06: 2 [IU] via SUBCUTANEOUS
  Administered 2023-09-06 – 2023-09-07 (×3): 1 [IU] via SUBCUTANEOUS
  Administered 2023-09-08: 2 [IU] via SUBCUTANEOUS

## 2023-09-03 MED ORDER — ACETAMINOPHEN 500 MG PO TABS: 500.0000 mg | ORAL_TABLET | Freq: Every evening | ORAL | Status: DC | PRN

## 2023-09-03 MED ORDER — WARFARIN - PHARMACIST DOSING INPATIENT: Freq: Every day | Status: DC

## 2023-09-03 MED ORDER — LOSARTAN POTASSIUM 25 MG PO TABS
12.5000 mg | ORAL_TABLET | Freq: Every day | ORAL | Status: DC
  Administered 2023-09-03 – 2023-09-04 (×2): 12.5 mg via ORAL
  Filled 2023-09-03: qty 0.5
  Filled 2023-09-03 (×2): qty 1

## 2023-09-03 MED ORDER — SPIRONOLACTONE 12.5 MG HALF TABLET
12.5000 mg | ORAL_TABLET | Freq: Every day | ORAL | Status: DC
  Administered 2023-09-03 – 2023-09-05 (×3): 12.5 mg via ORAL
  Filled 2023-09-03 (×3): qty 1

## 2023-09-03 MED ORDER — ASPIRIN 81 MG PO TBEC
81.0000 mg | DELAYED_RELEASE_TABLET | Freq: Every day | ORAL | Status: DC
  Administered 2023-09-04 – 2023-09-08 (×5): 81 mg via ORAL
  Filled 2023-09-03 (×6): qty 1

## 2023-09-03 MED ORDER — HEPARIN (PORCINE) 25000 UT/250ML-% IV SOLN
1200.0000 [IU]/h | INTRAVENOUS | Status: DC
  Administered 2023-09-03 – 2023-09-04 (×2): 1200 [IU]/h via INTRAVENOUS
  Filled 2023-09-03 (×2): qty 250

## 2023-09-03 MED ORDER — ACETAMINOPHEN 325 MG PO TABS: 650.0000 mg | ORAL_TABLET | ORAL | Status: DC | PRN

## 2023-09-03 MED ORDER — CITALOPRAM HYDROBROMIDE 20 MG PO TABS
20.0000 mg | ORAL_TABLET | Freq: Every day | ORAL | Status: DC
  Administered 2023-09-03 – 2023-09-07 (×5): 20 mg via ORAL
  Filled 2023-09-03 (×5): qty 1

## 2023-09-03 MED ORDER — FUROSEMIDE 10 MG/ML IJ SOLN
80.0000 mg | Freq: Two times a day (BID) | INTRAMUSCULAR | Status: DC
  Administered 2023-09-03 – 2023-09-04 (×2): 80 mg via INTRAVENOUS
  Filled 2023-09-03 (×2): qty 8

## 2023-09-03 MED ORDER — LEVOTHYROXINE SODIUM 75 MCG PO TABS
75.0000 ug | ORAL_TABLET | Freq: Every day | ORAL | Status: DC
  Administered 2023-09-04 – 2023-09-08 (×5): 75 ug via ORAL
  Filled 2023-09-03 (×5): qty 1

## 2023-09-03 MED ORDER — HEPARIN BOLUS VIA INFUSION
5000.0000 [IU] | Freq: Once | INTRAVENOUS | Status: AC
  Administered 2023-09-03: 5000 [IU] via INTRAVENOUS
  Filled 2023-09-03: qty 5000

## 2023-09-03 MED ORDER — ALLOPURINOL 300 MG PO TABS
300.0000 mg | ORAL_TABLET | Freq: Every day | ORAL | Status: DC
  Administered 2023-09-04 – 2023-09-08 (×5): 300 mg via ORAL
  Filled 2023-09-03 (×5): qty 1

## 2023-09-03 MED ORDER — ROSUVASTATIN CALCIUM 5 MG PO TABS
10.0000 mg | ORAL_TABLET | Freq: Every day | ORAL | Status: DC
  Administered 2023-09-03 – 2023-09-08 (×6): 10 mg via ORAL
  Filled 2023-09-03 (×6): qty 2

## 2023-09-03 MED ORDER — DIPHENHYDRAMINE HCL 25 MG PO CAPS: 25.0000 mg | ORAL_CAPSULE | Freq: Every evening | ORAL | Status: DC | PRN

## 2023-09-03 MED ORDER — PNEUMOCOCCAL 20-VAL CONJ VACC 0.5 ML IM SUSY
0.5000 mL | PREFILLED_SYRINGE | INTRAMUSCULAR | Status: AC
  Administered 2023-09-05: 0.5 mL via INTRAMUSCULAR
  Filled 2023-09-03: qty 0.5

## 2023-09-03 MED ORDER — DIPHENHYDRAMINE-APAP (SLEEP) 25-500 MG PO TABS: 1.0000 | ORAL_TABLET | Freq: Every evening | ORAL | Status: DC | PRN

## 2023-09-03 MED ORDER — ONDANSETRON HCL 4 MG/2ML IJ SOLN
4.0000 mg | Freq: Four times a day (QID) | INTRAMUSCULAR | Status: DC | PRN
  Administered 2023-09-05: 4 mg via INTRAVENOUS
  Filled 2023-09-03: qty 2

## 2023-09-03 MED ORDER — DIAZEPAM 5 MG PO TABS: 5.0000 mg | ORAL_TABLET | Freq: Two times a day (BID) | ORAL | Status: DC | PRN

## 2023-09-03 MED ORDER — FUROSEMIDE 10 MG/ML IJ SOLN
40.0000 mg | Freq: Once | INTRAMUSCULAR | Status: AC
  Administered 2023-09-03: 40 mg via INTRAVENOUS
  Filled 2023-09-03: qty 4

## 2023-09-03 MED ORDER — MELATONIN 3 MG PO TABS
3.0000 mg | ORAL_TABLET | Freq: Every day | ORAL | Status: DC
  Administered 2023-09-03 – 2023-09-07 (×5): 3 mg via ORAL
  Filled 2023-09-03 (×5): qty 1

## 2023-09-03 NOTE — H&P (Signed)
 History and Physical    Patient: [name]:[id number] DOB: 09/23/1955 DOA: 09/03/2023 DOS: the patient was seen and examined on 09/03/2023 PCP: Edda Goo, MD  Patient coming from: Home  Chief Complaint:  Chief Complaint  Patient presents with   Shortness of Breath   HPI: TERRYL NIZIOLEK is a 68 y.o. 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  She has experienced progressive shortness of breath over several months, which has acutely worsened today, prompting her to seek hospital care. She needs to move from room to room to catch her breath and has canceled her scheduled doctor's appointments due to the severity of her symptoms. She does not use oxygen regularly but has a CPAP machine, which she struggles to use at night due to mask fit issues. Recently, she has used the CPAP machine to help relax her breathing during the day. Her symptoms significantly interfere with daily activities such as feeding and dressing herself and caring for her dog.  She has a history of leg swelling, which she manages with diuretics. Recently, the swelling has increased, and she developed a blister on her leg. She notes that when her legs swell and then reduce, blisters sometimes appear. She has been elevating her feet at night to manage the swelling, but her face has also become puffy.  She lives alone with her dog but has friends nearby who can assist if needed.  In the emergency department patient was noted to have heart rates 46-52, blood pressures maintained, and O2 saturations maintained on 1 L nasal cannula oxygen.  Labs significant for BNP 455.1, BUN 44, creatinine 1.69, and high-sensitivity troponin 123->131.  Chest x-ray  revealed cardiomegaly with vascular congestion and edema.  Patient having given Lasix  40 mg IV x 1 dose and started on a heparin  drip.  Review of Systems: As mentioned in the history of present illness. All other systems reviewed and are negative. Past Medical History:  Diagnosis Date   A-fib (HCC)    Anemia    Anticoagulated on Coumadin , chronically 09/03/2011   Anxiety    Arthritis    "right hip; both knees; left wrist/shoulder; back" (01/19/2013"   Bleeding on Coumadin  08/2012; 01/18/2013   [facility] admissions (01/19/2013)   CHF (congestive heart failure) (HCC)    "2-3 times" (01/19/2013)   Chronic lower back pain    Depression    DVT (deep venous thrombosis) (HCC) 10 years ago   numerous/notes 01/18/2013   GERD (gastroesophageal reflux disease)    Gout    Headache(784.0)    "maybe weekly" (01/19/2013)   Heart murmur    High cholesterol    "been off RX for this at one time" (01/18/2013)   History of blood transfusion 1983; 04/2012   "3 w/ childbirth; hospitalized for pain" (01/19/2013)   Hypertension    Hypothyroidism    Migraines    "twice/yr maybe" (01/19/2013)   Obstructive sleep apnea 05/03/2012   OSA (obstructive sleep apnea)    "sent me for test in 04/2012; never ordered mask, etc" (01/19/2013)   PE (pulmonary thromboembolism) (HCC) 3 years ag0   3/notes 01/18/2013   Pneumonia before 2011   "once' (01/18/2013)   Renal disorder    kindey function low; "Metformin was destroying my kidneys" (01/19/2013)   Shortness of  breath    "only related to my CHF" (01/18/2013)   Swelling of hand 08/31/2014   RT HAND   Type II diabetes mellitus (HCC)    UTI (urinary tract infection) 08/31/2014   Past Surgical History:  Procedure Laterality Date   CARDIAC CATHETERIZATION N/A 03/13/2015   Procedure: Right/Left Heart Cath and Coronary Angiography;  Surgeon: Knox Perl, MD;  Location: [facility] INVASIVE CV LAB;  Service: Cardiovascular;  Laterality: N/A;   CATARACT EXTRACTION W/ INTRAOCULAR  LENS  IMPLANT, BILATERAL Bilateral 2006-2011   CESAREAN SECTION  1983   CHOLECYSTECTOMY  ~ 2002   COLONOSCOPY N/A 01/21/2013   Procedure: COLONOSCOPY;  Surgeon: Almeda Aris, MD;  Location: [facility] ENDOSCOPY;  Service: Endoscopy;  Laterality: N/A;   EYE SURGERY Bilateral    "multiple" (01/18/2013)   PACEMAKER IMPLANT N/A 08/31/2018   Symptomatic bradycardia due to mobitz II second degree AV block, permanent afib/ atypical atrial flutter implanted by Dr Nunzio Belch   PARS PLANA REPAIR OF RETINAL DEATACHMENT Right    PARS PLANA VITRECTOMY Bilateral 2004-2006   "several" (01/18/2013)   REFRACTIVE SURGERY Bilateral    "for stigmatism" (01/18/2013)   REFRACTIVE SURGERY Left ~ 11/2012   "to puff it up cause vision got hazy" (01/18/2013)   RIGHT HEART CATH N/A 12/30/2018   Procedure: RIGHT HEART CATH;  Surgeon: Mardell Shade, MD;  Location: [facility] INVASIVE CV LAB;  Service: Cardiovascular;  Laterality: N/A;   VENA CAVA FILTER PLACEMENT  2011?   Social History:  reports that she quit smoking about 35 years ago. Her smoking use included cigarettes. She started smoking about 65 years ago. She has a 1.5 pack-year smoking history. She has never used smokeless tobacco. She reports that she does not currently use alcohol. She reports that she does not use drugs.  Allergies  Allergen Reactions   Ms Contin [Morphine] Hives and Rash         Family History  Problem Relation Age of Onset   Cerebral aneurysm Mother    Hypertension Father    Cerebral aneurysm Maternal Grandfather    Cerebral aneurysm Maternal Aunt    Cancer Maternal Uncle     Prior to Admission medications   Medication Sig Start Date End Date Taking? Authorizing Provider  allopurinol  (ZYLOPRIM ) 300 MG tablet Take 300 mg by mouth daily.   Yes [provider]  Biotin w/ Vitamins C & E (HAIR SKIN & NAILS GUMMIES PO) Take 2 each by mouth daily at 4 PM.   Yes [provider]  citalopram  (CELEXA ) 20 MG tablet Take 20 mg by  mouth at bedtime.   Yes [provider]  diazepam  (VALIUM ) 5 MG tablet Take 5 mg by mouth 2 (two) times daily as needed for anxiety or muscle spasms.   Yes [provider]  diphenhydramine-acetaminophen  (TYLENOL  PM) 25-500 MG TABS tablet Take 1 tablet by mouth at bedtime.   Yes [provider]  Ferrous Sulfate  (IRON  PO) Take 1 tablet by mouth daily.   Yes [provider]  insulin  aspart (NOVOLOG ) 100 UNIT/ML injection Inject 6 Units into the skin 3 (three) times daily with meals. Patient taking differently: Inject 10 Units into the skin 3 (three) times daily with meals. 09/12/21  Yes Deforest Fast, MD  insulin  glargine (LANTUS  SOLOSTAR) 100 UNIT/ML Solostar Pen Inject 20 Units into the skin daily.   Yes [provider]  levothyroxine  (SYNTHROID ) 75 MCG tablet Take 75 mcg by mouth daily before breakfast.    Yes [provider]  losartan  (COZAAR ) 25 MG tablet Take 1 tablet (25 mg total) by mouth daily. NEEDS FOLLOW UP APPOINTMENT FOR MORE REFILLS Patient taking differently: Take 12.5 mg by mouth at bedtime. 01/16/23  Yes [place], Walden, FNP  MELATONIN PO Take 2 each by mouth at bedtime. Melatonin gummies   Yes [provider]  Multiple Vitamins-Minerals (CENTRUM SILVER) CHEW Chew 2 each by mouth daily.   Yes [provider]  rosuvastatin  (CRESTOR ) 10 MG tablet Take 1 tablet (10 mg total) by mouth daily. 05/09/16  Yes Michae Aden, MD  spironolactone  (ALDACTONE ) 25 MG tablet Take 12.5 mg by mouth at bedtime.   Yes [provider]  torsemide  (DEMADEX ) 20 MG tablet Take 3 tablets (60 mg total) by mouth 2 (two) times daily. Patient taking differently: Take 40 mg by mouth See admin instructions. Take 2 tablets (40mg ) by mouth twice daily, in the morning and at 1600. 09/12/21  Yes Deforest Fast, MD  warfarin (COUMADIN ) 2.5 MG tablet TAKE 7.5 MG ON M-W-F AND 5 MG TUES, THURS, SAT AND SUN AS DIRECTED Patient taking  differently: Take 2.5-5 mg by mouth See admin instructions. Take 1 or 2 tablets by mouth every day at 1600, alternating 1 tablet (2.5mg ) and 2 tablets (5mg ) every other day. 02/10/16  Yes Ona Bidding, MD    Physical Exam: Vitals:   09/03/23 1015 09/03/23 1030 09/03/23 1200 09/03/23 1413  BP: (!) 149/53 (!) 160/66 (!) 165/60 (!) 152/53  Pulse:  (!) 46 (!) 50 (!) 47  Resp:  18 16 18   Temp:    98.9 F (37.2 C)  TempSrc:      SpO2:  99% 97% 100%  Weight:      Height:         Constitutional: Elderly female who appears chronically ill Eyes: PERRL, lids and conjunctivae normal ENMT: Mucous membranes are moist. Posterior pharynx clear of any exudate or lesions.Normal dentition.  Neck: normal, supple, JVD present. Respiratory: Patient noted to have crackles in the lower lung fields.  Currently on 2 L nasal cannula oxygen with O2 saturations maintained. Cardiovascular: Bradycardic.  Positive systolic murmur present.  At least 2+ pitting bilateral lower extremity edema.  2+ pedal pulses. No carotid bruits.  Abdomen: no tenderness, no masses palpated. Bowel sounds positive.  Musculoskeletal: no clubbing / cyanosis. No joint deformity upper and lower extremities. Good ROM, no contractures. Normal muscle tone.  Skin: Venous stasis changes of the lower extremities. Neurologic: CN 2-12 grossly intact.  Strength 5/5 in all 4.  Psychiatric: Normal judgment and insight. Alert and oriented x 3. Normal mood.   Data Reviewed:  EKG revealed a ventricularly paced rhythm at 50 bpm.  Reviewed labs, imaging, and pertinent records as documented  Assessment and Plan:   Heart failure with preserved ejection fraction Acute on chronic.  Patient presents with progressively worsening swelling and shortness of breath.  Noted to have at least 2+ pitting edema and JVD present.  Last echocardiogram noted EF to be 60 to 65% with indeterminate diastolic parameters when checked back in 09/2021.  Patient had been  given Lasix  IV. - Admit to a cardiac telemetry bed - Heart failure order set utilized - Strict I&O's and daily weights - Continue Lasix  80 mg IV twice daily - Check echocardiogram - Continue losartan  and Aldactone  - Cardiology consulted we will follow-up for any further recommendations.  Elevated troponin Chronic.  On admission high-sensitivity troponin noted to be 123-> 131.  Last cardiac cath was  back in 2016 which noted mild diffuse disease of the circumflex and LAD.  Patient was scheduled for a right and left heart cath on 6/2. - Continue to monitor  Paroxysmal atrial fibrillation on chronic anticoagulation Heart block s/p PPM Subtherapeutic INR Patient noted to be bradycardic with heart rates in the 50s.  INR noted to be subtherapeutic at 1.7.  Patient noted to be in a ventricularly paced rhythm. - Continue heparin  per pharmacy  Essential hypertension Blood pressures currently maintained - Continue home blood pressure regimen  Pancytopenia Chronic.  Hemoglobin 11.7 which appears from patient's baseline with MCV 109.5 and platelet count 122 -Continue to monitor  History of recurrent DVT/PE - Continue heparin  per pharmacy  Diabetes mellitus type 2, with long-term use of insulin  Patient's last hemoglobin A1c was 6 when checked back in 09/09/2021. - Hypoglycemic protocols - Continue Lantus  20 units - CBGs q. before meals and at bedtime with sensitive SSI - Adjust insulin  regimen as needed  Hypothyroidism - Continue Synthroid   Hyperlipidemia - Continue Crestor   Anxiety and depression - Continue Celexa     OSA Patient reports she is not compliant with CPAP at night.  Morbid obesity BMI 54.4 kg/m  DVT prophylaxis: Heparin  Advance Care Planning:   Code Status: Full Code   Consults: Cardiology Family Communication: None  Severity of Illness: The appropriate patient status for this patient is INPATIENT. Inpatient status is judged to be reasonable and necessary in  order to provide the required intensity of service to ensure the patient's safety. The patient's presenting symptoms, physical exam findings, and initial radiographic and laboratory data in the context of their chronic comorbidities is felt to place them at high risk for further clinical deterioration. Furthermore, it is not anticipated that the patient will be medically stable for discharge from the hospital within 2 midnights of admission.   * I certify that at the point of admission it is my clinical judgment that the patient will require inpatient hospital care spanning beyond 2 midnights from the point of admission due to high intensity of service, high risk for further deterioration and high frequency of surveillance required.*  Author: Lena Qualia, MD 09/03/2023 3:51 PM  For on call review [email or web address].

## 2023-09-03 NOTE — Consult Note (Cosign Needed Addendum)
 Cardiology Consultation   Patient ID: [name] MRN: [id number]; DOB: 06-06-1955  Admit date: 09/03/2023 Date of Consult: 09/03/2023  PCP:  Edda Goo, MD   [facility] Providers Cardiologist:  Gaylyn Keas, MD  Sleep Medicine:  Gaylyn Keas, MD       Patient Profile:   NANAKO [name] is a 68 y.o. 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  History of Present Illness:   Ms. Demps is a 68 year old female with prior cardiac history listed below. She had an IVC filter placed in 2011 for DVTs after having a retinal hemorrhage. Had a chest pain in 2016 and received a left and right heart cath that showed no CAD in RCA, there was mild diffuse disease in the LAD and LcX. Also Had a DVT in 2016 and was placed on warfarin. Was hospitalized on 08/2018 for bradycardia and syncope after falling and fracturing her back and was found to have a Mobitz type II second-degree AV block and received a dual-chamber Medtronic PPM. Was admitted on 12/2018 acute on chronic diastolic heart failure.  Echo showed LVEF was 65 to 70% with diastolic dysfunction.   Had a right heart cath in 2020 that showed minimal pulmonary hypertension, and high cardiac output with a suspected peripheral shunt. Had PYP scan on 12/2018 that was not convincing for amyloidosis. Last echo was done on 09/2021 and showed LVEF 60-65%, moderate concentric LVH, Elevated LVEDP, mildly dilated left and right atrium, moderate to severe MAC, mild to moderate aortic regurgitation. Was last seen by cardiology on 10/2021 for a posthospital follow-up for a CHF exacerbation.  At that time was on losartan , torsemide , and spironolactone .  Was  also on Coumadin  for history of PE's.     Presented to the ER 09/03/2023 for worsening shortness of breath.  Noticed that shortness of breath started worsening after Easter (07/26/23).  She is having more difficulty looking after herself and doing her activities of daily living such as ambulating about the house and bringing groceries into the house.  It has been more difficult for her to catch her breath. Is unable to do more than 4 metabolic equivalents of exertion. Does not use oxygen at home.  Nasal cannula oxygen has helped her shortness of breath. eats premade foods such as chicken patties at home and does not have the energy to cook fresh food. About a month ago she had chest pain, neck pain and jaw pain and felt like she probably should have gone to the hospital but did not.  Has had a dry cough for the past week.  Reports that she follows up with her PCP who is at an independent private practice about once a month.  I am unable to see any recent records from the patient's PCP. Also reports getting her INR checked regularly but I am unable to verify this in the EMR.  Also reported being seen by urology and nephrology for a renal mass.  Was reportedly taking torsemide  40 mg twice daily. denies nausea, vomiting, fever, orthopnea, and diaphoresis.  Denies tobacco use, alcohol use, marijuana use, illicit substance use.  Denies any past history of heart attack or stroke. Patient has received a dose  of 40 mg IV Lasix  in the ER.  Labs showed Normal potassium, elevated creatinine of 1.69 this appears to be about baseline, elevated BUN of 44, initial hemoglobin was slightly decreased at 11.7 but follow-up was normal at 12.6. BNP at 455 High-sensitivity troponins 123 Respiratory panel negative Chest x-ray showed: Cardiomegaly with vascular congestion and edema.  EKG showed atrial fibrillation (absent p waves), a V paced rhythm with a wide QRS a rate of 50, and a PVC. Ordered echo  Past Medical History:   Diagnosis Date   A-fib (HCC)    Anemia    Anticoagulated on Coumadin , chronically 09/03/2011   Anxiety    Arthritis    "right hip; both knees; left wrist/shoulder; back" (01/19/2013"   Bleeding on Coumadin  08/2012; 01/18/2013   [facility] admissions (01/19/2013)   CHF (congestive heart failure) (HCC)    "2-3 times" (01/19/2013)   Chronic lower back pain    Depression    DVT (deep venous thrombosis) (HCC) 10 years ago   numerous/notes 01/18/2013   GERD (gastroesophageal reflux disease)    Gout    Headache(784.0)    "maybe weekly" (01/19/2013)   Heart murmur    High cholesterol    "been off RX for this at one time" (01/18/2013)   History of blood transfusion 1983; 04/2012   "3 w/ childbirth; hospitalized for pain" (01/19/2013)   Hypertension    Hypothyroidism    Migraines    "twice/yr maybe" (01/19/2013)   Obstructive sleep apnea 05/03/2012   OSA (obstructive sleep apnea)    "sent me for test in 04/2012; never ordered mask, etc" (01/19/2013)   PE (pulmonary thromboembolism) (HCC) 3 years ag0   3/notes 01/18/2013   Pneumonia before 2011   "once' (01/18/2013)   Renal disorder    kindey function low; "Metformin was destroying my kidneys" (01/19/2013)   Shortness of breath    "only related to my CHF" (01/18/2013)   Swelling of hand 08/31/2014   RT HAND   Type II diabetes mellitus (HCC)    UTI (urinary tract infection) 08/31/2014    Past Surgical History:  Procedure Laterality Date   CARDIAC CATHETERIZATION N/A 03/13/2015   Procedure: Right/Left Heart Cath and Coronary Angiography;  Surgeon: Knox Perl, MD;  [facility] INVASIVE CV LAB;  Service: Cardiovascular;  Laterality: N/A;   CATARACT EXTRACTION W/ INTRAOCULAR LENS  IMPLANT, BILATERAL Bilateral 2006-2011   CESAREAN SECTION  1983   CHOLECYSTECTOMY  ~ 2002   COLONOSCOPY N/A 01/21/2013   Procedure: COLONOSCOPY;  Surgeon: Almeda Aris, MD;  Location: [facility] ENDOSCOPY;  Service: Endoscopy;  Laterality: N/A;   EYE SURGERY Bilateral     "multiple" (01/18/2013)   PACEMAKER IMPLANT N/A 08/31/2018   Symptomatic bradycardia due to mobitz II second degree AV block, permanent afib/ atypical atrial flutter implanted by Dr Nunzio Belch   PARS PLANA REPAIR OF RETINAL DEATACHMENT Right    PARS PLANA VITRECTOMY Bilateral 2004-2006   "several" (01/18/2013)   REFRACTIVE SURGERY Bilateral    "for stigmatism" (01/18/2013)   REFRACTIVE SURGERY Left ~ 11/2012   "to puff it up cause vision got hazy" (01/18/2013)   RIGHT HEART CATH N/A 12/30/2018   Procedure: RIGHT HEART CATH;  Surgeon: Mardell Shade, MD;  Location: [facility] INVASIVE CV LAB;  Service: Cardiovascular;  Laterality: N/A;   VENA CAVA FILTER PLACEMENT  2011?     Home Medications:  Prior to Admission medications   Medication Sig Start Date End Date Taking? Authorizing Provider  allopurinol  (ZYLOPRIM ) 300 MG tablet Take  300 mg by mouth daily.   Yes [provider]  Biotin w/ Vitamins C & E (HAIR SKIN & NAILS GUMMIES PO) Take 2 each by mouth daily at 4 PM.   Yes [provider]  citalopram  (CELEXA ) 20 MG tablet Take 20 mg by mouth at bedtime.   Yes [provider]  diazepam  (VALIUM ) 5 MG tablet Take 5 mg by mouth 2 (two) times daily as needed for anxiety or muscle spasms.   Yes [provider]  diphenhydramine-acetaminophen  (TYLENOL  PM) 25-500 MG TABS tablet Take 1 tablet by mouth at bedtime.   Yes [provider]  Ferrous Sulfate  (IRON  PO) Take 1 tablet by mouth daily.   Yes [provider]  insulin  aspart (NOVOLOG ) 100 UNIT/ML injection Inject 6 Units into the skin 3 (three) times daily with meals. Patient taking differently: Inject 10 Units into the skin 3 (three) times daily with meals. 09/12/21  Yes Deforest Fast, MD  insulin  glargine (LANTUS  SOLOSTAR) 100 UNIT/ML Solostar Pen Inject 20 Units into the skin daily.   Yes [provider]  levothyroxine  (SYNTHROID ) 75 MCG tablet Take 75 mcg by mouth daily before breakfast.     Yes [provider]  losartan  (COZAAR ) 25 MG tablet Take 1 tablet (25 mg total) by mouth daily. NEEDS FOLLOW UP APPOINTMENT FOR MORE REFILLS Patient taking differently: Take 12.5 mg by mouth at bedtime. 01/16/23  Yes [place], De [place]et, FNP  MELATONIN PO Take 2 each by mouth at bedtime. Melatonin gummies   Yes [provider]  Multiple Vitamins-Minerals (CENTRUM SILVER) CHEW Chew 2 each by mouth daily.   Yes [provider]  rosuvastatin  (CRESTOR ) 10 MG tablet Take 1 tablet (10 mg total) by mouth daily. 05/09/16  Yes Michae Aden, MD  spironolactone  (ALDACTONE ) 25 MG tablet Take 12.5 mg by mouth at bedtime.   Yes [provider]  torsemide  (DEMADEX ) 20 MG tablet Take 3 tablets (60 mg total) by mouth 2 (two) times daily. Patient taking differently: Take 40 mg by mouth See admin instructions. Take 2 tablets (40mg ) by mouth twice daily, in the morning and at 1600. 09/12/21  Yes Deforest Fast, MD  warfarin (COUMADIN ) 2.5 MG tablet TAKE 7.5 MG ON M-W-F AND 5 MG TUES, THURS, SAT AND SUN AS DIRECTED Patient taking differently: Take 2.5-5 mg by mouth See admin instructions. Take 1 or 2 tablets by mouth every day at 1600, alternating 1 tablet (2.5mg ) and 2 tablets (5mg ) every other day. 02/10/16  Yes Ona Bidding, MD    Inpatient Medications: Scheduled Meds:  furosemide   80 mg Intravenous BID   losartan   12.5 mg Oral QHS   rosuvastatin   10 mg Oral Daily   spironolactone   12.5 mg Oral QHS   Continuous Infusions:  PRN Meds:   Allergies:    Allergies  Allergen Reactions   Ms Contin [Morphine] Hives and Rash         Social History:   Social History   Socioeconomic History   Marital status: Single    Spouse name: Not on file   Number of children: Not on file   Years of education: Not on file   Highest education level: Not on file  Occupational History   Occupation: disabled  Tobacco Use   Smoking status: Former    Current packs/day:  0.00    Average packs/day: 0.1 packs/day for 30.0 years (1.5 ttl pk-yrs)    Types: Cigarettes    Start date: 84  Quit date: 2    Years since quitting: 35.4   Smokeless tobacco: Never   Tobacco comments:    01/19/2013 "quit smoking cigarettes in the early '90's"  Substance and Sexual Activity   Alcohol use: Not Currently    Comment: rare now but never a heavy drinker   Drug use: No   Sexual activity: Never  Other Topics Concern   Not on file  Social History Narrative   Not on file   Social Drivers of Health   Financial Resource Strain: Not on file  Food Insecurity: Not on file  Transportation Needs: Not on file  Physical Activity: Not on file  Stress: Not on file  Social Connections: Not on file  Intimate Partner Violence: Not on file    Family History:    Family History  Problem Relation Age of Onset   Cerebral aneurysm Mother    Hypertension Father    Cerebral aneurysm Maternal Grandfather    Cerebral aneurysm Maternal Aunt    Cancer Maternal Uncle      ROS:  Please see the history of present illness.   All other ROS reviewed and negative.     Physical Exam/Data:   Vitals:   09/03/23 1015 09/03/23 1030 09/03/23 1200 09/03/23 1413  BP: (!) 149/53 (!) 160/66 (!) 165/60 (!) 152/53  Pulse:  (!) 46 (!) 50 (!) 47  Resp:  18 16 18   Temp:    98.9 F (37.2 C)  TempSrc:      SpO2:  99% 97% 100%  Weight:      Height:       No intake or output data in the 24 hours ending 09/03/23 1558    09/03/2023    9:39 AM 12/23/2021   10:43 PM 10/25/2021   11:35 AM  Last 3 Weights  Weight (lbs) 268 lb 248 lb 267 lb 12.8 oz  Weight (kg) 121.564 kg 112.492 kg 121.473 kg     Body mass index is 50.64 kg/m.  General: Overweight female appearing older than stated age HEENT: normal Neck: JVD to jaw Vascular: No carotid bruits; Distal pulses 2+ bilaterally Cardiac: Wide S1,S2 split ; RRR; 2 out of 6 systolic ejection murmur on right upper sternal border Lungs:  Bibasilar crackles Abd: soft, nontender, no hepatomegaly  Ext: 2+ bilateral lower extremity edema.  Legs are bruised and have chronic venous stasis changes. Musculoskeletal:  No deformities, BUE and BLE strength normal and equal Skin: warm and dry  Neuro:   no focal abnormalities noted Psych:  Normal affect   EKG:  The EKG was personally reviewed and demonstrates:  atrial fibrillation (absent p waves), a V paced rhythm with a wide QRS a rate of 50, and a PV Telemetry:  Telemetry was personally reviewed and demonstrates: Absent P waves, V-paced rhythm in the 50s with a few PVCs.  Relevant CV Studies: Echo pending  Laboratory Data:  High Sensitivity Troponin:   Recent Labs  Lab 09/03/23 0949 09/03/23 1211  TROPONINIHS 123* 131*     Chemistry Recent Labs  Lab 09/03/23 0949 09/03/23 0955  NA 142 143  K 4.2 4.2  CL 103  --   CO2 23  --   GLUCOSE 172*  --   BUN 44*  --   CREATININE 1.69*  --   CALCIUM  9.4  --   GFRNONAA 33*  --   ANIONGAP 16*  --     Recent Labs  Lab 09/03/23 0949  PROT 6.5  ALBUMIN  4.0  AST 32  ALT 24  ALKPHOS 58  BILITOT 1.3*   Lipids No results for input(s): "CHOL", "TRIG", "HDL", "LABVLDL", "LDLCALC", "CHOLHDL" in the last 168 hours.  Hematology Recent Labs  Lab 09/03/23 0949 09/03/23 0955  WBC 6.7  --   RBC 3.57*  --   HGB 11.7* 12.6  HCT 39.1 37.0  MCV 109.5*  --   MCH 32.8  --   MCHC 29.9*  --   RDW 14.8  --   PLT 122*  --    Thyroid No results for input(s): "TSH", "FREET4" in the last 168 hours.  BNP Recent Labs  Lab 09/03/23 0949  BNP 455.1*    DDimer No results for input(s): "DDIMER" in the last 168 hours.   Radiology/Studies:  DG Chest Port 1 View Result Date: 09/03/2023 CLINICAL DATA:  Cough. EXAM: PORTABLE CHEST 1 VIEW COMPARISON:  Chest radiograph dated 12/23/2021. FINDINGS: There is cardiomegaly with vascular congestion. Bilateral mid to lower lung field densities, likely edema. Pneumonia is not excluded. No large  pleural effusion. No pneumothorax. Left pectoral pacemaker device. No acute osseous pathology. IMPRESSION: Cardiomegaly with vascular congestion and edema. Pneumonia is not excluded. Electronically Signed   By: Angus Bark M.D.   On: 09/03/2023 11:13     Assessment and Plan:   MIRAGE PFEFFERKORN is a 68 y.o. female with a hx of hypertension, DVT/PE x3, s/p IVC in 2011 on warfarin, LBBB, atrial fibrillation Mobitz 2 second-degree AV block s/p MRI conditional dual-chamber Medtronic PPM on 08/2018, chronic diastolic heart failure, OSA, CKD 3B, type 2 diabetes who is being seen 09/03/2023 for the evaluation of CHF at the request of Hiawatha Lout MD.  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  Noticed that this of breath has been worsening since "Easter."  Shortness of breath is worse with exertion.  Denies orthopnea.  Has chronic lower extremity edema, bibasilar crackles and JVD. Had PYP scan on 12/2018 that was not convincing for amyloidosis. Last echo was done on 09/2021 and showed LVEF 60-65%, moderate concentric LVH, Elevated LVEDP, mildly dilated left and right atrium, moderate to severe MAC, mild to moderate aortic regurgitation. Prior to admission was on torsemide  40 mg twice daily. - Chest x-ray showed: Cardiomegaly with vascular congestion and edema.  - Creatinine 1.69 this appears to be about baseline, potassium normal at 4.2.  Will continue to monitor potassium due to CKD and using 40 mg torsemide  twice daily I suspect will need more Lasix  to diurese. - Increase IV Lasix  to 80 mg twice daily - Order echo. - Order magnesium   GDMT -Continue home losartan  12.5 mg at bedtime, and Aldactone  12.5 mg daily. - SGLT2 was previously avoided due to concerns about hygiene/incontinence. Blood pressures in the ER have been  slightly elevated with most recent BP being 152/53.  Will trend blood pressures with diuresis  Chest pain/ Unstable angina Nonobstructive CAD Elevated troponin Hyperlipidemia cath in 2016 showed no CAD in RCA, there was mild diffuse disease in the LAD and LcX Reported a prior episode of chest pain/jaw pain/shoulder pain but a month ago but did not go to to hospital for this. Reportedly has had several more episodes of chest pain.  Does not currently have chest pain. High-sensitivity troponins 123>131. - Continue home Crestor  10 mg - Order lipid panel - Start aspirin  81mg  daily - Plan to  diurese and L/R cath on Monday if euvolemic at that time. Is on the list for Monday but still need to consent and place orders.  History of DVT/PE status post IVC in 2011 currently on warfarin Atrial fibrillation High degree AV block status post Medtronic PPM CHA2DS2-VASc Score = 6 [CHF History: 1, HTN History: 1, Diabetes History: 1, Stroke History: 0, Vascular Disease History: 1, Age Score: 1, Gender Score: 1].  Therefore, the patient's annual risk of stroke is 9.7 %.    INR 1.7 09/03/2023 Patient reported having INR checks but I am unable to see this in the EMR.  Will order D-dimer for DVT/PE as we do not know if INR was previously at goal. - stop home warfarin - start IV heparin    CKD stage IIIb Creatinine 1.69 appears to be about baseline  Type 2 diabetes Management per primary   Risk Assessment/Risk Scores:        New York  Heart Association (NYHA) Functional Class NYHA Class IV  CHA2DS2-VASc Score = 6   This indicates a 9.7% annual risk of stroke. The patient's score is based upon: CHF History: 1 HTN History: 1 Diabetes History: 1 Stroke History: 0 Vascular Disease History: 1 Age Score: 1 Gender Score: 1     For questions or updates, please contact [facility] Please consult [email or web address] for contact info under    Signed, Melita Springer, PA-C  09/03/2023 3:58 PM

## 2023-09-03 NOTE — Progress Notes (Addendum)
 ANTICOAGULATION CONSULT NOTE  **ADDENDUM 09/03/23 5:29 PM** Warfarin discontinued by cardiology. Heparin  per pharmacy consult placed for the interim. 6mg  warfarin that was ordered for this evening has not been given and order has been discontinued. RN is aware of plan.  Heparin  dosing weight = 78.3 kg  Give 5000 units IV heparin  bolus Start 1200 units/hr heparin  infusion Check heparin  level at 0200 Daily heparin  level while on heparin  Monitor for s/s hemorrhage F/u plan for oral anticoagulation resume and agent selection (DOAC vs warfarin)  Dionicio Fray, PharmD, BCPS 09/03/2023 5:29 PM ED Clinical Pharmacist -  [phone]     Pharmacy Consult for Warfarin Indication: atrial fibrillation and DVT  Allergies  Allergen Reactions   Ms Contin [Morphine] Hives and Rash         Patient Measurements: Height: 5\' 1"  (154.9 cm) Weight: 121.6 kg (268 lb) IBW/kg (Calculated) : 47.8  Vital Signs: Temp: 98.9 F (37.2 C) (05/29 1413) Temp Source: Oral (05/29 0937) BP: 152/53 (05/29 1413) Pulse Rate: 47 (05/29 1413)  Labs: Recent Labs    09/03/23 0949 09/03/23 0955 09/03/23 1211  HGB 11.7* 12.6  --   HCT 39.1 37.0  --   PLT 122*  --   --   LABPROT 19.9*  --   --   INR 1.7*  --   --   CREATININE 1.69*  --   --   TROPONINIHS 123*  --  131*    Estimated Creatinine Clearance: 39.4 mL/min (A) (by C-G formula based on SCr of 1.69 mg/dL (H)).   Medical History: Past Medical History:  Diagnosis Date   A-fib (HCC)    Anemia    Anticoagulated on Coumadin , chronically 09/03/2011   Anxiety    Arthritis    "right hip; both knees; left wrist/shoulder; back" (01/19/2013"   Bleeding on Coumadin  08/2012; 01/18/2013   [facility] admissions (01/19/2013)   CHF (congestive heart failure) (HCC)    "2-3 times" (01/19/2013)   Chronic lower back pain    Depression    DVT (deep venous thrombosis) (HCC) 10 years ago   numerous/notes 01/18/2013   GERD (gastroesophageal reflux disease)     Gout    Headache(784.0)    "maybe weekly" (01/19/2013)   Heart murmur    High cholesterol    "been off RX for this at one time" (01/18/2013)   History of blood transfusion 1983; 04/2012   "3 w/ childbirth; hospitalized for pain" (01/19/2013)   Hypertension    Hypothyroidism    Migraines    "twice/yr maybe" (01/19/2013)   Obstructive sleep apnea 05/03/2012   OSA (obstructive sleep apnea)    "sent me for test in 04/2012; never ordered mask, etc" (01/19/2013)   PE (pulmonary thromboembolism) (HCC) 3 years ag0   3/notes 01/18/2013   Pneumonia before 2011   "once' (01/18/2013)   Renal disorder    kindey function low; "Metformin was destroying my kidneys" (01/19/2013)   Shortness of breath    "only related to my CHF" (01/18/2013)   Swelling of hand 08/31/2014   RT HAND   Type II diabetes mellitus (HCC)    UTI (urinary tract infection) 08/31/2014    Medications:  (Not in a hospital admission)  Scheduled:   furosemide   80 mg Intravenous BID   losartan   12.5 mg Oral QHS   rosuvastatin   10 mg Oral Daily   spironolactone   12.5 mg Oral QHS   Infusions:  PRN:   Assessment: 89 yof with a history of hypertension, DVT/PE x3, s/p  IVC in 2011 on warfarin, LBBB, AF, AV block s/p dual chamber medtronic PPM, HF, OSA, CKD, T2DM. Patient is presenting with SOB. Warfarin per pharmacy consult placed for atrial fibrillation and DVT.  Patient taking warfarin prior to arrival. Home dose is Take 1 or 2 tablets by mouth every day at 1600, alternating 1 tablet (2.5mg ) and 2 tablets (5mg ) every other day. Last taken 5/28 4 pm per med rec patient took 5mg  yesterday.  PT / INR today is 19.9 / 1.7, which is sub-therapeutic Hgb 12.6; plt 122  Goal of Therapy:  INR Goal 2-3 Monitor platelets by anticoagulation protocol: Yes   Plan:  Suspect patient may be candidate for DOAC if cost is not an issue. Will give 6 mg warfarin today. Further dosing per INR. Monitor for s/s of hemorrhage, daily INR,  CBC Watch for new DDIs  Dionicio Fray, PharmD, BCPS 09/03/2023 3:52 PM ED Clinical Pharmacist -  [phone]

## 2023-09-03 NOTE — ED Triage Notes (Signed)
 Pt to ED with c/o SOB. Pt has had SOB x3 months, worsening today with exertion. Pt speaking in clear, full sentences. Hx CHF. Denies CP.

## 2023-09-03 NOTE — ED Provider Notes (Signed)
 [facility] AT [facility] Provider Note  CSN: [id number] Arrival date & time: 09/03/23 0930  Chief Complaint(s) Shortness of Breath  HPI [name] is a 68 y.o. female history of complete heart block status post PPM, CHF, prior DVT on warfarin, diabetes presenting to the emergency department shortness of breath.  Reports over past few weeks has had worsening dyspnea, with any activity.  Today was worse so she had to call 911.  She reports that she also feels some shortness of breath at rest.  She reports some intermittent chest tightness.  She has had some dry cough.  Also reports some sore throat, runny nose and congestion.  No fevers or chills.  No abdominal pain.  Reports occasional nausea, no vomiting.  No back pain.  Reports chronic leg swelling.   Past Medical History Past Medical History:  Diagnosis Date   A-fib (HCC)    Anemia    Anticoagulated on Coumadin , chronically 09/03/2011   Anxiety    Arthritis    "right hip; both knees; left wrist/shoulder; back" (01/19/2013"   Bleeding on Coumadin  08/2012; 01/18/2013   BRBPR admissions (01/19/2013)   CHF (congestive heart failure) (HCC)    "2-3 times" (01/19/2013)   Chronic lower back pain    Depression    DVT (deep venous thrombosis) (HCC) 10 years ago   numerous/notes 01/18/2013   GERD (gastroesophageal reflux disease)    Gout    Headache(784.0)    "maybe weekly" (01/19/2013)   Heart murmur    High cholesterol    "been off RX for this at one time" (01/18/2013)   History of blood transfusion 1983; 04/2012   "3 w/ childbirth; hospitalized for pain" (01/19/2013)   Hypertension    Hypothyroidism    Migraines    "twice/yr maybe" (01/19/2013)   Obstructive sleep apnea 05/03/2012   OSA (obstructive sleep apnea)    "sent me for test in 04/2012; never ordered mask, etc" (01/19/2013)   PE (pulmonary thromboembolism) (HCC) 3 years ag0   3/notes 01/18/2013   Pneumonia before 2011   "once'  (01/18/2013)   Renal disorder    kindey function low; "Metformin was destroying my kidneys" (01/19/2013)   Shortness of breath    "only related to my CHF" (01/18/2013)   Swelling of hand 08/31/2014   RT HAND   Type II diabetes mellitus (HCC)    UTI (urinary tract infection) 08/31/2014   Patient Active Problem List   Diagnosis Date Noted   Acute on chronic systolic (congestive) heart failure (HCC) 09/03/2023   Pain in left hip 01/07/2023   Pain in right knee 01/07/2023   Acute on chronic heart failure with preserved ejection fraction (HFpEF) (HCC) 09/10/2021   Type 2 diabetes mellitus with stage 3b chronic kidney disease, with long-term current use of insulin  (HCC) 09/09/2021   Mixed diabetic hyperlipidemia associated with type 2 diabetes mellitus (HCC) 09/09/2021   Hypothyroidism 09/09/2021   Acute cardiogenic pulmonary edema (HCC) 09/09/2021   Wound of left leg, initial encounter 09/09/2021   C6 cervical fracture (HCC) 09/01/2018   Atrial flutter (HCC) 09/01/2018   Paroxysmal atrial fibrillation (HCC) 09/01/2018   Syncope 08/29/2018   Symptomatic advanced heart block 08/29/2018   Acute on chronic diastolic CHF (congestive heart failure) (HCC)    COPD (chronic obstructive pulmonary disease) (HCC) 09/12/2015   Personal history of endocrine, metabolic or immunity disorder 57/84/6962   Decreased mobility 04/03/2015   Deep vein thrombosis (DVT) of both lower extremities (HCC) 02/24/2015  Chronic kidney disease, stage 3b (HCC) 02/24/2015   Long term current use of anticoagulant therapy 11/19/2014   Osteoarthritis 08/31/2014   Cellulitis of hand, right 08/31/2014   Tremors  05/07/2012   Obstructive sleep apnea 05/03/2012   Anticoagulated on Coumadin , chronically 09/03/2011   Thrombocytopenia, this admission, 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 08/13/2011   History of pulmonary embolism, recurrent, 3 seperate episodes. 08/13/2011   Presence of IVC filter, placed June 2011 after 3d PE 08/13/2011   Pulmonary HTN, Moderate  PA pressure 09/2009 08/13/2011   Home Medication(s) Prior to Admission medications   Medication Sig Start Date End Date Taking? Authorizing Provider  allopurinol  (ZYLOPRIM ) 300 MG tablet Take 300 mg by mouth daily.   Yes [provider]  Biotin w/ Vitamins C & E (HAIR SKIN & NAILS GUMMIES PO) Take 2 each by mouth daily at 4 PM.   Yes [provider]  citalopram  (CELEXA ) 20 MG tablet Take 20 mg by mouth at bedtime.   Yes [provider]  diazepam  (VALIUM ) 5 MG tablet Take 5 mg by mouth 2 (two) times daily as needed for anxiety or muscle spasms.   Yes [provider]  diphenhydramine-acetaminophen  (TYLENOL  PM) 25-500 MG TABS tablet Take 1 tablet by mouth at bedtime.   Yes [provider]  Ferrous Sulfate  (IRON  PO) Take 1 tablet by mouth daily.   Yes [provider]  insulin  aspart (NOVOLOG ) 100 UNIT/ML injection Inject 6 Units into the skin 3 (three) times daily with meals. Patient taking differently: Inject 10 Units into the skin 3 (three) times daily with meals. 09/12/21  Yes Deforest Fast, MD  insulin  glargine (LANTUS  SOLOSTAR) 100 UNIT/ML Solostar Pen Inject 20 Units into the skin daily.   Yes [provider]  levothyroxine  (SYNTHROID ) 75 MCG tablet Take 75 mcg by mouth daily before breakfast.    Yes [provider]  losartan  (COZAAR ) 25 MG tablet Take 1 tablet (25 mg total) by mouth daily. NEEDS FOLLOW UP APPOINTMENT FOR MORE REFILLS Patient taking differently: Take 12.5 mg by mouth at bedtime. 01/16/23  Yes [place], [place], FNP  MELATONIN PO Take 2 each by mouth at bedtime. Melatonin  gummies   Yes [provider]  Multiple Vitamins-Minerals (CENTRUM SILVER) CHEW Chew 2 each by mouth daily.   Yes [provider]  rosuvastatin  (CRESTOR ) 10 MG tablet Take 1 tablet (10 mg total) by mouth daily. 05/09/16  Yes Michae Aden, MD  spironolactone  (ALDACTONE ) 25 MG tablet Take 12.5 mg by mouth at bedtime.   Yes [provider]  torsemide  (DEMADEX ) 20 MG tablet Take 3 tablets (60 mg total) by mouth 2 (two) times daily. Patient taking differently: Take 40 mg by mouth See admin instructions. Take 2 tablets (40mg ) by mouth twice daily, in the morning and at 1600. 09/12/21  Yes Deforest Fast, MD  warfarin (COUMADIN ) 2.5 MG tablet TAKE 7.5 MG ON M-W-F AND 5 MG TUES, THURS, SAT AND SUN AS DIRECTED Patient taking differently: Take 2.5-5 mg by mouth See admin instructions. Take 1 or 2 tablets by mouth every day at 1600, alternating 1 tablet (2.5mg ) and 2 tablets (5mg ) every other day. 02/10/16  Yes Ona Bidding, MD                                                                                                                                    Past Surgical History Past Surgical History:  Procedure Laterality Date   CARDIAC CATHETERIZATION N/A 03/13/2015   Procedure: Right/Left Heart Cath and Coronary Angiography;  Surgeon: Knox Perl, MD;  Location: [facility] INVASIVE CV LAB;  Service: Cardiovascular;  Laterality: N/A;   CATARACT EXTRACTION W/ INTRAOCULAR LENS  IMPLANT, BILATERAL Bilateral 2006-2011   CESAREAN SECTION  1983   CHOLECYSTECTOMY  ~ 2002   COLONOSCOPY N/A 01/21/2013   Procedure: COLONOSCOPY;  Surgeon: Almeda Aris, MD;  Location: [facility] ENDOSCOPY;  Service: Endoscopy;  Laterality: N/A;   EYE SURGERY Bilateral    "multiple" (01/18/2013)   PACEMAKER IMPLANT N/A 08/31/2018   Symptomatic bradycardia due to mobitz II second degree AV block, permanent afib/ atypical atrial flutter implanted by Dr Nunzio Belch   PARS PLANA REPAIR OF RETINAL DEATACHMENT Right     PARS PLANA VITRECTOMY Bilateral 2004-2006   "several" (01/18/2013)   REFRACTIVE SURGERY Bilateral    "for stigmatism" (01/18/2013)   REFRACTIVE SURGERY Left ~ 11/2012   "to puff it up cause vision got hazy" (01/18/2013)   RIGHT HEART CATH N/A 12/30/2018   Procedure: RIGHT HEART CATH;  Surgeon: Mardell Shade, MD;  Location: [facility] INVASIVE CV LAB;  Service: Cardiovascular;  Laterality: N/A;   VENA CAVA FILTER PLACEMENT  2011?   Family History Family History  Problem Relation Age of Onset   Cerebral aneurysm Mother    Hypertension Father    Cerebral aneurysm Maternal Grandfather    Cerebral aneurysm Maternal Aunt    Cancer Maternal Uncle     Social History Social History   Tobacco Use   Smoking status: Former    Current packs/day: 0.00    Average packs/day: 0.1 packs/day for 30.0 years (1.5 ttl pk-yrs)    Types: Cigarettes    Start date: 31    Quit date: 19    Years since quitting: 35.4   Smokeless tobacco: Never   Tobacco comments:    01/19/2013 "quit smoking cigarettes in the early '90's"  Substance Use Topics   Alcohol use: Not Currently    Comment: rare now but never a heavy drinker   Drug use: No   Allergies Ms contin [morphine]  Review of Systems Review of Systems  All other systems reviewed and are negative.   Physical Exam Vital Signs  I have reviewed the triage vital signs BP (!) 152/53   Pulse (!) 47   Temp 98.9 F (37.2 C)   Resp 18   Ht 5\' 1"  (1.549 m)   Wt 121.6 kg   SpO2 100%   BMI 50.64 kg/m  Physical Exam Vitals and nursing note reviewed.  Constitutional:      General: She is not in acute distress.  Appearance: She is well-developed.  HENT:     Head: Normocephalic and atraumatic.     Mouth/Throat:     Mouth: Mucous membranes are moist.  Eyes:     Pupils: Pupils are equal, round, and reactive to light.  Cardiovascular:     Rate and Rhythm: Normal rate and regular rhythm.     Heart sounds: No murmur heard. Pulmonary:      Effort: Pulmonary effort is normal. Tachypnea present. No respiratory distress.     Breath sounds: Rales (trace bibasilar) present.  Abdominal:     General: Abdomen is flat.     Palpations: Abdomen is soft.     Tenderness: There is no abdominal tenderness.  Musculoskeletal:        General: No tenderness.     Right lower leg: Edema present.     Left lower leg: Edema present.     Comments: 1+ b/l to shin. Chronic venous stasis changes  Skin:    General: Skin is warm and dry.  Neurological:     General: No focal deficit present.     Mental Status: She is alert. Mental status is at baseline.  Psychiatric:        Mood and Affect: Mood normal.        Behavior: Behavior normal.     ED Results and Treatments Labs (all labs ordered are listed, but only abnormal results are displayed) Labs Reviewed  COMPREHENSIVE METABOLIC PANEL WITH GFR - Abnormal; Notable for the following components:      Result Value   Glucose, Bld 172 (*)    BUN 44 (*)    Creatinine, Ser 1.69 (*)    Total Bilirubin 1.3 (*)    GFR, Estimated 33 (*)    Anion gap 16 (*)    All other components within normal limits  CBC WITH DIFFERENTIAL/PLATELET - Abnormal; Notable for the following components:   RBC 3.57 (*)    Hemoglobin 11.7 (*)    MCV 109.5 (*)    MCHC 29.9 (*)    Platelets 122 (*)    Lymphs Abs 0.5 (*)    All other components within normal limits  BRAIN NATRIURETIC PEPTIDE - Abnormal; Notable for the following components:   B Natriuretic Peptide 455.1 (*)    All other components within normal limits  PROTIME-INR - Abnormal; Notable for the following components:   Prothrombin Time 19.9 (*)    INR 1.7 (*)    All other components within normal limits  I-STAT VENOUS BLOOD GAS, ED - Abnormal; Notable for the following components:   pH, Ven 7.449 (*)    pCO2, Ven 39.2 (*)    Acid-Base Excess 3.0 (*)    Calcium , Ion 1.10 (*)    All other components within normal limits  TROPONIN I (HIGH SENSITIVITY) -  Abnormal; Notable for the following components:   Troponin I (High Sensitivity) 123 (*)    All other components within normal limits  TROPONIN I (HIGH SENSITIVITY) - Abnormal; Notable for the following components:   Troponin I (High Sensitivity) 131 (*)    All other components within normal limits  RESP PANEL BY RT-PCR (RSV, FLU A&B, COVID)  RVPGX2  MAGNESIUM   D-DIMER, QUANTITATIVE  LIPID PANEL  Radiology DG Chest Port 1 View Result Date: 09/03/2023 CLINICAL DATA:  Cough. EXAM: PORTABLE CHEST 1 VIEW COMPARISON:  Chest radiograph dated 12/23/2021. FINDINGS: There is cardiomegaly with vascular congestion. Bilateral mid to lower lung field densities, likely edema. Pneumonia is not excluded. No large pleural effusion. No pneumothorax. Left pectoral pacemaker device. No acute osseous pathology. IMPRESSION: Cardiomegaly with vascular congestion and edema. Pneumonia is not excluded. Electronically Signed   By: Angus Bark M.D.   On: 09/03/2023 11:13    Pertinent labs & imaging results that were available during my care of the patient were reviewed by me and considered in my medical decision making (see MDM for details).  Medications Ordered in ED Medications  furosemide  (LASIX ) injection 80 mg (has no administration in time range)  spironolactone  (ALDACTONE ) tablet 12.5 mg (has no administration in time range)  losartan  (COZAAR ) tablet 12.5 mg (has no administration in time range)  rosuvastatin  (CRESTOR ) tablet 10 mg (has no administration in time range)  furosemide  (LASIX ) injection 40 mg (40 mg Intravenous Given 09/03/23 1113)                                                                                                                                     Procedures Procedures  (including critical care time)  Medical Decision Making / ED  Course   MDM:  68 year old presenting to the emergency department with shortness of breath.  Patient overall well-appearing, is mildly tachypneic, but not hypoxic.  Possible trace crackles in basilar lung fields  Suspect most likely cause is patient's bradycardia.  Reviewing recent pacemaker interrogations, was previously only partially paced ventricularly but this month appears that has been almost 100% ventricular paced.  Basal rate is set at 50 which is relatively low.  Differential also includes CHF exacerbation, obesity hypoventilation syndrome, ACS, pneumonia, viral URI, pneumothorax.  Will obtain medical workup and chest x-ray, interrogate pacemaker.  Anticipate will need cardiology consultation given likely cause being pacemaker  Clinical Course as of 09/03/23 1555  Thu Sep 03, 2023  1553 Signed out to Dr. Felipe Horton for admission. Cardiology has been consulted. Troponin was elevated, but stable.  [WS]    Clinical Course User Index [WS] Mordecai Applebaum, MD     Additional history obtained: -Additional history obtained from ems -External records from outside source obtained and reviewed including: Chart review including previous notes, labs, imaging, consultation notes including prior notes    Lab Tests: -I ordered, reviewed, and interpreted labs.   The pertinent results include:   Labs Reviewed  COMPREHENSIVE METABOLIC PANEL WITH GFR - Abnormal; Notable for the following components:      Result Value   Glucose, Bld 172 (*)    BUN 44 (*)    Creatinine, Ser 1.69 (*)    Total Bilirubin 1.3 (*)    GFR, Estimated 33 (*)    Anion gap 16 (*)    All other components  within normal limits  CBC WITH DIFFERENTIAL/PLATELET - Abnormal; Notable for the following components:   RBC 3.57 (*)    Hemoglobin 11.7 (*)    MCV 109.5 (*)    MCHC 29.9 (*)    Platelets 122 (*)    Lymphs Abs 0.5 (*)    All other components within normal limits  BRAIN NATRIURETIC PEPTIDE - Abnormal; Notable  for the following components:   B Natriuretic Peptide 455.1 (*)    All other components within normal limits  PROTIME-INR - Abnormal; Notable for the following components:   Prothrombin Time 19.9 (*)    INR 1.7 (*)    All other components within normal limits  I-STAT VENOUS BLOOD GAS, ED - Abnormal; Notable for the following components:   pH, Ven 7.449 (*)    pCO2, Ven 39.2 (*)    Acid-Base Excess 3.0 (*)    Calcium , Ion 1.10 (*)    All other components within normal limits  TROPONIN I (HIGH SENSITIVITY) - Abnormal; Notable for the following components:   Troponin I (High Sensitivity) 123 (*)    All other components within normal limits  TROPONIN I (HIGH SENSITIVITY) - Abnormal; Notable for the following components:   Troponin I (High Sensitivity) 131 (*)    All other components within normal limits  RESP PANEL BY RT-PCR (RSV, FLU A&B, COVID)  RVPGX2  MAGNESIUM   D-DIMER, QUANTITATIVE  LIPID PANEL    Notable for elevated bnp, elevated troponin  EKG   EKG Interpretation Date/Time:  Thursday Sep 03 2023 09:36:55 EDT Ventricular Rate:  50 PR Interval:    QRS Duration:  183 QT Interval:  523 QTC Calculation: 477 R Axis:   -82  Text Interpretation: VENTRICULAR PACED RHYTHM Premature ventricular complexes Confirmed by Hiawatha Lout ([id number]) on 09/03/2023 9:42:50 AM         Imaging Studies ordered: I ordered imaging studies including CXR On my interpretation imaging demonstrates pulmonary edema  I independently visualized and interpreted imaging. I agree with the radiologist interpretation   Medicines ordered and prescription drug management: Meds ordered this encounter  Medications   furosemide  (LASIX ) injection 40 mg   furosemide  (LASIX ) injection 80 mg   spironolactone  (ALDACTONE ) tablet 12.5 mg   losartan  (COZAAR ) tablet 12.5 mg   rosuvastatin  (CRESTOR ) tablet 10 mg    -I have reviewed the patients home medicines and have made adjustments as  needed   Consultations Obtained: I requested consultation with the cardiologist,  and discussed lab and imaging findings as well as pertinent plan - they recommend: admission   Cardiac Monitoring: The patient was maintained on a cardiac monitor.  I personally viewed and interpreted the cardiac monitored which showed an underlying rhythm of: NSR  Social Determinants of Health:  Diagnosis or treatment significantly limited by social determinants of health: obesity   Reevaluation: After the interventions noted above, I reevaluated the patient and found that their symptoms have improved  Co morbidities that complicate the patient evaluation  Past Medical History:  Diagnosis Date   A-fib (HCC)    Anemia    Anticoagulated on Coumadin , chronically 09/03/2011   Anxiety    Arthritis    "right hip; both knees; left wrist/shoulder; back" (01/19/2013"   Bleeding on Coumadin  08/2012; 01/18/2013   BRBPR admissions (01/19/2013)   CHF (congestive heart failure) (HCC)    "2-3 times" (01/19/2013)   Chronic lower back pain    Depression    DVT (deep venous thrombosis) (HCC) 10 years ago   numerous/notes  01/18/2013   GERD (gastroesophageal reflux disease)    Gout    Headache(784.0)    "maybe weekly" (01/19/2013)   Heart murmur    High cholesterol    "been off RX for this at one time" (01/18/2013)   History of blood transfusion 1983; 04/2012   "3 w/ childbirth; hospitalized for pain" (01/19/2013)   Hypertension    Hypothyroidism    Migraines    "twice/yr maybe" (01/19/2013)   Obstructive sleep apnea 05/03/2012   OSA (obstructive sleep apnea)    "sent me for test in 04/2012; never ordered mask, etc" (01/19/2013)   PE (pulmonary thromboembolism) (HCC) 3 years ag0   3/notes 01/18/2013   Pneumonia before 2011   "once' (01/18/2013)   Renal disorder    kindey function low; "Metformin was destroying my kidneys" (01/19/2013)   Shortness of breath    "only related to my CHF" (01/18/2013)    Swelling of hand 08/31/2014   RT HAND   Type II diabetes mellitus (HCC)    UTI (urinary tract infection) 08/31/2014      Dispostion: Disposition decision including need for hospitalization was considered, and patient admitted to the hospital.    Final Clinical Impression(s) / ED Diagnoses Final diagnoses:  Acute congestive heart failure, unspecified heart failure type [facility])  Disorder of cardiac pacemaker system, initial encounter     This chart was dictated using voice recognition software.  Despite best efforts to proofread,  errors can occur which can change the documentation meaning.    Mordecai Applebaum, MD 09/03/23 [phone]

## 2023-09-04 ENCOUNTER — Inpatient Hospital Stay (HOSPITAL_COMMUNITY)

## 2023-09-04 ENCOUNTER — Ambulatory Visit: Admitting: Cardiology

## 2023-09-04 DIAGNOSIS — I5033 Acute on chronic diastolic (congestive) heart failure: Secondary | ICD-10-CM

## 2023-09-04 DIAGNOSIS — Z86711 Personal history of pulmonary embolism: Secondary | ICD-10-CM | POA: Diagnosis not present

## 2023-09-04 LAB — ECHOCARDIOGRAM COMPLETE
AR max vel: 2.32 cm2
AV Area VTI: 2.12 cm2
AV Area mean vel: 2.18 cm2
AV Mean grad: 6.5 mmHg
AV Peak grad: 12.2 mmHg
Ao pk vel: 1.75 m/s
Area-P 1/2: 4.06 cm2
Height: 61 in
S' Lateral: 3.5 cm
Weight: 4589.1 [oz_av]

## 2023-09-04 LAB — PROTIME-INR
INR: 2 — ABNORMAL HIGH (ref 0.8–1.2)
Prothrombin Time: 22.7 s — ABNORMAL HIGH (ref 11.4–15.2)

## 2023-09-04 LAB — BASIC METABOLIC PANEL WITH GFR
Anion gap: 8 (ref 5–15)
BUN: 51 mg/dL — ABNORMAL HIGH (ref 8–23)
CO2: 29 mmol/L (ref 22–32)
Calcium: 8.8 mg/dL — ABNORMAL LOW (ref 8.9–10.3)
Chloride: 103 mmol/L (ref 98–111)
Creatinine, Ser: 1.99 mg/dL — ABNORMAL HIGH (ref 0.44–1.00)
GFR, Estimated: 27 mL/min — ABNORMAL LOW (ref >60)
Glucose, Bld: 160 mg/dL — ABNORMAL HIGH (ref 70–99)
Potassium: 4.1 mmol/L (ref 3.5–5.1)
Sodium: 140 mmol/L (ref 135–145)

## 2023-09-04 LAB — CBC
HCT: 32.3 % — ABNORMAL LOW (ref 36.0–46.0)
Hemoglobin: 10 g/dL — ABNORMAL LOW (ref 12.0–15.0)
MCH: 33.3 pg (ref 26.0–34.0)
MCHC: 31 g/dL (ref 30.0–36.0)
MCV: 107.7 fL — ABNORMAL HIGH (ref 80.0–100.0)
Platelets: 115 10*3/uL — ABNORMAL LOW (ref 150–400)
RBC: 3 MIL/uL — ABNORMAL LOW (ref 3.87–5.11)
RDW: 14.8 % (ref 11.5–15.5)
WBC: 5.9 10*3/uL (ref 4.0–10.5)
nRBC: 0 % (ref 0.0–0.2)

## 2023-09-04 LAB — HEMOGLOBIN A1C
Hgb A1c MFr Bld: 5.7 % — ABNORMAL HIGH (ref 4.8–5.6)
Mean Plasma Glucose: 116.89 mg/dL

## 2023-09-04 LAB — D-DIMER, QUANTITATIVE: D-Dimer, Quant: 4.03 ug{FEU}/mL — ABNORMAL HIGH (ref 0.00–0.50)

## 2023-09-04 LAB — GLUCOSE, CAPILLARY
Glucose-Capillary: 193 mg/dL — ABNORMAL HIGH (ref 70–99)
Glucose-Capillary: 147 mg/dL — ABNORMAL HIGH (ref 70–99)
Glucose-Capillary: 128 mg/dL — ABNORMAL HIGH (ref 70–99)
Glucose-Capillary: 179 mg/dL — ABNORMAL HIGH (ref 70–99)

## 2023-09-04 LAB — HIV ANTIBODY (ROUTINE TESTING W REFLEX): HIV Screen 4th Generation wRfx: NONREACTIVE

## 2023-09-04 LAB — HEPARIN LEVEL (UNFRACTIONATED)
Heparin Unfractionated: 0.38 [IU]/mL (ref 0.30–0.70)
Heparin Unfractionated: 0.33 [IU]/mL (ref 0.30–0.70)

## 2023-09-04 MED ORDER — PERFLUTREN LIPID MICROSPHERE
1.0000 mL | INTRAVENOUS | Status: AC | PRN
  Administered 2023-09-04: 2 mL via INTRAVENOUS

## 2023-09-04 NOTE — Progress Notes (Addendum)
  Progress Note   Patient: [name] WUJ:[id number] DOB: Apr 14, 1955 DOA: 09/03/2023     1 DOS: the patient was seen and examined on 09/04/2023 at 8:26AM      Brief hospital course: 68 y.o. F with MO, CKD IIIb baseline 1.5, HTN, HLD, DM, COPD, dCHF, pAF on warfarin, CHB s/p PPM, hx DVT/PE with IVC, and hypothyroidism who presented with CHF flare.     Assessment and Plan: Acute on chronic diastolic congestive heart failure Presented with slowly progressive shortness of breath with exertion, leg swelling, needing to use CPAP because of shortness of breath with exertion.  Chest x-ray in the ER showed bilateral opacities consistent with edema.  No signs to suggest pneumonia.  Creatinine up today with IV diuresis in the last 24 hours, volume status not possible to determine due to body habitus.  Creatinine slightly up today. - Defer IV Lasix  to cardiology - Plan for RHC/LHC  Myocardial injury due to CHF Coronary artery disease Hypertension Hyperlipidemia Blood pressure elevated - Continue losartan , spironolactone  - Defer beta-blocker to cardiology - Continue aspirin , Crestor  - Continue heparin  gtt - Plan for heart cath Monday  Paroxysmal atrial fibrillation Chronic heart block Subtherapeutic INR -Hold warfarin for now - Continue heparin  for heart cath  Gout -Continue allopurinol   History of recurrent DVT/PE History of IVC filter See anticoagulation notes above  Diabetes Glucose controlled - Continue home glargine - Continue sliding scale corrections  Hypothyroidism -Continue levothyroxine   Anxiety depression -Continue citalopram , Valium   OSA Intolerant of CPAP  Morbid obesity Class II obesity, BMI 54.2, complicates care  Anemia Thrombocytopenia Pancytopenia ruled out Counts stable, no clinical bleeding observed or reported       Subjective: Patient notes no change.  Leg swelling is not changed.  No orthopnea.  No fever.     Physical  Exam: BP (!) 163/42 (BP Location: Left Wrist)   Pulse (!) 50   Temp 98 F (36.7 C) (Oral)   Resp 18   Ht 5\' 1"  (1.549 m)   Wt 130.1 kg   SpO2 98%   BMI 54.19 kg/m   Adult female, lying flat in bed, interactive and appropriate RRR, heart sounds are notable due to body habitus, chronic venous stasis changes of both lower extremities, unknown baseline Respiratory normal, lung sounds diminished Abdomen soft, no tenderness to palpation Attention normal, affect appropriate, judgment and insight appear normal, generalized weakness but symmetric strength   Data Reviewed: Discussed with cardiology Basic metabolic panel shows creatinine up to 1.9 CBC shows stable anemia, thrombocytopenia  Family Communication:     Disposition: Status is: Inpatient         Author: Ephriam Hashimoto, MD 09/04/2023 2:52 PM  For on call review [email or web address].

## 2023-09-04 NOTE — Assessment & Plan Note (Signed)
 Glucose controlled - Continue home glargine - Continue sliding scale corrections

## 2023-09-04 NOTE — Assessment & Plan Note (Signed)
 Continue levothyroxine 

## 2023-09-04 NOTE — Progress Notes (Signed)
 Heart Failure Navigator Progress Note  Assessed for Heart & Vascular TOC clinic readiness.  Patient does not meet criteria due to Advanced Heart Failure team patient of Dr. Gala Romney.   Navigator will sign off at this time.    Rhae Hammock, BSN, Scientist, clinical (histocompatibility and immunogenetics) Only

## 2023-09-04 NOTE — Progress Notes (Signed)
 ANTICOAGULATION CONSULT NOTE Pharmacy Consult for Heparin  Indication: atrial fibrillation and DVT Brief A/P: Heparin  level within goal range Continue Heparin  at current rate    Allergies  Allergen Reactions   Ms Contin [Morphine] Hives and Rash         Patient Measurements: Height: 5\' 1"  (154.9 cm) Weight: 130.1 kg (286 lb 13.1 oz) IBW/kg (Calculated) : 47.8  Vital Signs: Temp: 98 F (36.7 C) (05/30 1145) Temp Source: Oral (05/30 1145) BP: 163/42 (05/30 1145) Pulse Rate: 50 (05/30 1145)  Labs: Recent Labs    09/03/23 0949 09/03/23 0955 09/03/23 1211 09/04/23 0406 09/04/23 1151  HGB 11.7* 12.6  --  10.0*  --   HCT 39.1 37.0  --  32.3*  --   PLT 122*  --   --  115*  --   LABPROT 19.9*  --   --  22.7*  --   INR 1.7*  --   --  2.0*  --   HEPARINUNFRC  --   --   --  0.38 0.33  CREATININE 1.69*  --   --  1.99*  --   TROPONINIHS 123*  --  131*  --   --     Estimated Creatinine Clearance: 34.9 mL/min (A) (by C-G formula based on SCr of 1.99 mg/dL (H)).  Assessment: 68 yo female with a history DVT/PE  and Afib for heparin .  Level this afternoon remains in goal range.  Goal of Therapy:  Heparin  level 0.3-0.7 units/mL INR Goal 2-3 Monitor platelets by anticoagulation protocol: Yes   Plan:  No change to heparin  F/U plan for anticoagulation after cath lab  Cheryll Corti, [facility] Clinical Pharmacist  09/04/2023 2:20 PM   [facility] pharmacy phone numbers are listed on [email or web address]

## 2023-09-04 NOTE — Assessment & Plan Note (Signed)
 Class III, BMI 54, complicates care

## 2023-09-04 NOTE — Progress Notes (Signed)
*  PRELIMINARY RESULTS* Echocardiogram 2D Echocardiogram has been performed.  Glenna Lango 09/04/2023, 9:56 AM

## 2023-09-04 NOTE — Progress Notes (Addendum)
 Progress Note  Patient Name: [name] Date of Encounter: 09/04/2023 [facility] Cardiologist: Gaylyn Keas, MD   Interval Summary   Patient resting in bed On 1-2 L of oxygen via [place] for comfort SpO2 was previously 99% on room air  Reports feeling better since yesterday Unclear of her urine output, not accurately charted Awaiting RHC/LHC on Monday  Vital Signs Vitals:   09/03/23 2351 09/04/23 0430 09/04/23 0559 09/04/23 0738  BP: (!) 148/60 (!) 156/65    Pulse:  (!) 50  (!) 50  Resp: 18 17  18   Temp: 97.8 F (36.6 C) 97.8 F (36.6 C)  98.3 F (36.8 C)  TempSrc: Oral Oral  Oral  SpO2:  96%    Weight:   130.1 kg   Height:       Intake/Output Summary (Last 24 hours) at 09/04/2023 1015 Last data filed at 09/04/2023 0739 Gross per 24 hour  Intake 798.41 ml  Output 550 ml  Net 248.41 ml      09/04/2023    5:59 AM 09/03/2023   10:13 PM 09/03/2023    9:39 AM  Last 3 Weights  Weight (lbs) 286 lb 13.1 oz 287 lb 14.7 oz 268 lb  Weight (kg) 130.1 kg 130.6 kg 121.564 kg     Telemetry/ECG  V-paced, HR 50s  - Personally Reviewed  Physical Exam  GEN: No acute distress.   Neck: difficult to assess JVD due to body habitus Cardiac: RRR, SEM, rubs, or gallops.  Respiratory: distant breath sounds bilaterally. 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 Mild to moderate aortic regurgitation Presented with shortness of breath, DOE, LE edema CXR showed cardiomegaly, vascular congestion BNP 455 Given IV Lasix  40 mg x 1 and 80 mg x 2 doses Only recorded 550 cc of urine output -- patient unable to confirm/deny if this is accurate, she states that she pees more when she gets insulin    Creatinine rising to 1.99 today up from 1.69  Pending updated echocardiogram  Continue strict I&O's, daily weights,  daily BMPs Currently on IV Lasix  80 mg BID, with minimal urine output and rising creatinine, will discuss with MD once we have results of echocardiogram   Chest pain/ Unstable angina Nonobstructive CAD Elevated troponin Hyperlipidemia Troponin level 123 > 131 09/03/2023: ALT 24; HDL 50; LDL Cholesterol 44  Continue Crestor  10 mg daily Continue ASA 81 mg daily Plan for RHC/LHC Monday  History of DVT/PE s/p IVC filter in 2011 Currently on warfarin Atrial fibrillation High degree AV block s/p PPM INR was 2.0  D-dimer was ordered, elevated at 4.03 Currently on IV heparin  in replacement of Warfarin pending cath  Ordered LE doppler to rule out any DVTs Suspicion for PE is low as patient is not tachycardic or hypoxic, has IVC filter in place, chronically anticoagulated on Warfarin    For questions or updates, please contact [facility] Please consult [email or web address] for contact info under       Signed, Jiles Mote, PA-C   Patient seen and examined with TP PA-C.  Agree as above, with the following exceptions and changes as noted below. Prelim review of echo suggests calcific mitral and aortic disease with normal biventricular function.Gen: NAD, CV: RRR, systolic murmur precordium, Lungs: clear, Abd: soft, Extrem: discolored 1+ edema, 4/4 Dp pulses, warm, Neuro/Psych: alert and oriented x 3,  normal mood and affect. All available labs, radiology testing, previous records reviewed.   - I/O not charted, pt reports only average urine output. Cr rising. Needs R/L HC, INR 2, will plan for Monday.  - hold lasix  today and reassess renal function tomorrow. She feels unwell at home with progressive symptoms lifestyle limiting.  - IV heparin  in place of warfarin.  - pacing at 50.  - d-dimer was checked, LE dopplers performed. She has an IVC filter. PE less likely. RV function is normal but RV is mild-moderately enlarged.   [place] Karl A Mattison Stuckey, MD 09/04/23 4:50 PM

## 2023-09-04 NOTE — Progress Notes (Signed)
 Lower ext venous duplex  has been completed. Refer to [facility] under chart review to view preliminary results. Results given to patient's nurse, Soyla Duverney.  09/04/2023  4:44 PM Nyssa Sayegh, Hollace Lund

## 2023-09-04 NOTE — Assessment & Plan Note (Addendum)
 Myocardial injury due to CHF Coronary artery disease Hypertension Hyperlipidemia BP elevated again today - Torsemide , spironolactone , losartan  on hold with rising creatinine - Defer beta-blocker to cardiology - Continue aspirin  and Crestor  - Continue heparin  infusion   - Plan for heart cath Monday -As needed hydralazine  for severe range pressure

## 2023-09-04 NOTE — Assessment & Plan Note (Signed)
 Thrombocytopenia Pancytopenia ruled out Counts stable, no clinical bleeding observed or reported

## 2023-09-04 NOTE — Plan of Care (Signed)

## 2023-09-04 NOTE — Assessment & Plan Note (Signed)
 Presented with slowly progressive shortness of breath with exertion, leg swelling, needing to use CPAP because of shortness of breath with exertion.  Chest x-ray in the ER showed bilateral opacities consistent with edema.  No signs to suggest pneumonia.   Creatinine up today with IV diuresis in the last 24 hours, volume status not possible to determine due to body habitus.  Creatinine slightly up today. - Defer IV Lasix  to cardiology - Plan for RHC/LHC

## 2023-09-04 NOTE — Assessment & Plan Note (Signed)
 Intolerant of CPAP. ?

## 2023-09-04 NOTE — Assessment & Plan Note (Signed)
 Chronic heart block Subtherapeutic INR - Hold warfarin for now - Continue heparin  for heart cath

## 2023-09-04 NOTE — Progress Notes (Signed)
 ANTICOAGULATION CONSULT NOTE Pharmacy Consult for Heparin  Indication: atrial fibrillation and DVT Brief A/P: Heparin  level within goal range Continue Heparin  at current rate    Allergies  Allergen Reactions   Ms Contin [Morphine] Hives and Rash         Patient Measurements: Height: 5\' 1"  (154.9 cm) Weight: 130.6 kg (287 lb 14.7 oz) IBW/kg (Calculated) : 47.8  Vital Signs: Temp: 97.8 F (36.6 C) (05/30 0430) Temp Source: Oral (05/30 0430) BP: 156/65 (05/30 0430) Pulse Rate: 50 (05/30 0430)  Labs: Recent Labs    09/03/23 0949 09/03/23 0955 09/03/23 1211 09/04/23 0406  HGB 11.7* 12.6  --  10.0*  HCT 39.1 37.0  --  32.3*  PLT 122*  --   --  115*  LABPROT 19.9*  --   --  22.7*  INR 1.7*  --   --  2.0*  HEPARINUNFRC  --   --   --  0.38  CREATININE 1.69*  --   --  1.99*  TROPONINIHS 123*  --  131*  --     Estimated Creatinine Clearance: 35 mL/min (A) (by C-G formula based on SCr of 1.99 mg/dL (H)).  Assessment: 68 yo female with a history DVT/PE  and Afib for heparin   Goal of Therapy:  Heparin  level 0.3-0.7 units/mL INR Goal 2-3 Monitor platelets by anticoagulation protocol: Yes   Plan:  No change to heparin  F/U plan for anticoagulation  Claudine Cullens, PharmD, BCPS

## 2023-09-04 NOTE — Hospital Course (Signed)
 68 y.o. F with MO, CKD IIIb baseline 1.5, HTN, HLD, DM, COPD, dCHF, pAF on warfarin, CHB s/p PPM, hx DVT/PE with IVC, and hypothyroidism who presented with CHF flare.

## 2023-09-04 NOTE — Plan of Care (Signed)

## 2023-09-04 NOTE — Assessment & Plan Note (Addendum)
 History of IVC filter Acute DVT US  this admission purportedly shows acute DVT in left common femoral vein.  Not unexpected in this patient with recurrent VTE, IVC filter and subtherapeutic INR.  Need to figure out if DOAC is cost-prohibitive. - Continue heparin  gtt

## 2023-09-05 ENCOUNTER — Inpatient Hospital Stay (HOSPITAL_COMMUNITY)

## 2023-09-05 ENCOUNTER — Other Ambulatory Visit (HOSPITAL_COMMUNITY): Payer: Self-pay

## 2023-09-05 DIAGNOSIS — I5033 Acute on chronic diastolic (congestive) heart failure: Secondary | ICD-10-CM | POA: Diagnosis not present

## 2023-09-05 LAB — COMPREHENSIVE METABOLIC PANEL WITH GFR
ALT: 15 U/L (ref 0–44)
AST: 18 U/L (ref 15–41)
Albumin: 3.2 g/dL — ABNORMAL LOW (ref 3.5–5.0)
Alkaline Phosphatase: 46 U/L (ref 38–126)
Anion gap: 7 (ref 5–15)
BUN: 54 mg/dL — ABNORMAL HIGH (ref 8–23)
CO2: 28 mmol/L (ref 22–32)
Calcium: 8.6 mg/dL — ABNORMAL LOW (ref 8.9–10.3)
Chloride: 104 mmol/L (ref 98–111)
Creatinine, Ser: 1.91 mg/dL — ABNORMAL HIGH (ref 0.44–1.00)
GFR, Estimated: 28 mL/min — ABNORMAL LOW (ref >60)
Glucose, Bld: 138 mg/dL — ABNORMAL HIGH (ref 70–99)
Potassium: 4.4 mmol/L (ref 3.5–5.1)
Sodium: 139 mmol/L (ref 135–145)
Total Bilirubin: 0.8 mg/dL (ref 0.0–1.2)
Total Protein: 5.3 g/dL — ABNORMAL LOW (ref 6.5–8.1)

## 2023-09-05 LAB — CBC
HCT: 32.7 % — ABNORMAL LOW (ref 36.0–46.0)
Hemoglobin: 10.2 g/dL — ABNORMAL LOW (ref 12.0–15.0)
MCH: 33.7 pg (ref 26.0–34.0)
MCHC: 31.2 g/dL (ref 30.0–36.0)
MCV: 107.9 fL — ABNORMAL HIGH (ref 80.0–100.0)
Platelets: 113 10*3/uL — ABNORMAL LOW (ref 150–400)
RBC: 3.03 MIL/uL — ABNORMAL LOW (ref 3.87–5.11)
RDW: 14.7 % (ref 11.5–15.5)
WBC: 5.3 10*3/uL (ref 4.0–10.5)
nRBC: 0 % (ref 0.0–0.2)

## 2023-09-05 LAB — GLUCOSE, CAPILLARY
Glucose-Capillary: 112 mg/dL — ABNORMAL HIGH (ref 70–99)
Glucose-Capillary: 148 mg/dL — ABNORMAL HIGH (ref 70–99)
Glucose-Capillary: 163 mg/dL — ABNORMAL HIGH (ref 70–99)
Glucose-Capillary: 126 mg/dL — ABNORMAL HIGH (ref 70–99)

## 2023-09-05 LAB — HEPARIN LEVEL (UNFRACTIONATED)
Heparin Unfractionated: 0.28 [IU]/mL — ABNORMAL LOW (ref 0.30–0.70)
Heparin Unfractionated: 0.36 [IU]/mL (ref 0.30–0.70)

## 2023-09-05 LAB — PROTIME-INR
INR: 1.7 — ABNORMAL HIGH (ref 0.8–1.2)
Prothrombin Time: 19.8 s — ABNORMAL HIGH (ref 11.4–15.2)

## 2023-09-05 MED ORDER — HEPARIN (PORCINE) 25000 UT/250ML-% IV SOLN
1300.0000 [IU]/h | INTRAVENOUS | Status: DC
  Administered 2023-09-05 – 2023-09-06 (×3): 1300 [IU]/h via INTRAVENOUS
  Filled 2023-09-05 (×3): qty 250

## 2023-09-05 NOTE — Progress Notes (Signed)
  Progress Note   Patient: [name] UJW:[id number] DOB: 07/17/1955 DOA: 09/03/2023     2 DOS: the patient was seen and examined on 09/05/2023        Brief hospital course: 68 y.o. F with MO, CKD IIIb baseline 1.5, HTN, HLD, DM, COPD, dCHF, pAF on warfarin, CHB s/p PPM, hx DVT/PE with IVC, and hypothyroidism who presented with CHF flare.     Assessment and Plan: * Acute on chronic heart failure with preserved ejection fraction (HFpEF) (HCC) Presented with slowly progressive shortness of breath with exertion, leg swelling, needing to use CPAP because of shortness of breath with exertion.  Chest x-ray in the ER showed bilateral opacities consistent with edema.  No signs to suggest pneumonia.   Creatinine up today with IV diuresis in the last 24 hours, volume status not possible to determine due to body habitus.  Creatinine slightly up today. - Hold Lasix  - Plan for RHC/LHC  Elevated troponin Myocardial injury due to CHF Coronary artery disease Hypertension Hyperlipidemia Blood pressure elevated - Continue Lasix , losartan , spironolactone  - Defer beta-blocker to cardiology - Continue aspirin , Crestor  - Continue heparin  gtt - Plan for heart cath Monday  Paroxysmal atrial fibrillation (HCC) Chronic heart block Subtherapeutic INR - Hold warfarin for now - Continue heparin  for heart cath  Anemia Thrombocytopenia Pancytopenia ruled out Counts stable, no clinical bleeding observed or reported  History of pulmonary embolism, recurrent, 3 seperate episodes. History of IVC filter Acute DVT US  this admission purportedly shows acute DVT in left common femoral vein.  Not unexpected in this patient with recurrent VTE, IVC filter and subtherapeutic INR.  Need to figure out if DOAC is cost-prohibitive. - Continue heparin  gtt  Type 2 diabetes mellitus with stage 3b chronic kidney disease, with long-term current use of insulin  (HCC) Glucose controlled - Continue home glargine -  Continue sliding scale corrections  Hypothyroidism - Continue levothyroxine   Obstructive sleep apnea Intolerant of CPAP  Morbid obesity with BMI of 50.0-59.9, adult (HCC) Class III, BMI 54, complicates care          Subjective: Feeling okay.  No confusion, no fever, no chest pain, no change to energy level.  Not getting out of bed, so not sure if she is SOB.  Legs appear same to me, she can't tell.     Physical Exam: BP (!) 144/58 (BP Location: Left Wrist)   Pulse (!) 50   Temp 98.3 F (36.8 C) (Oral)   Resp 16   Ht 5\' 1"  (1.549 m)   Wt 131.3 kg   SpO2 98%   BMI 54.70 kg/m   Adult female, sitting on commode, interactive and appropriate RRR, heart sounds difficult to auscultate due to body habitus, chronic venous stasis changes of both lower extremities Respiratory rate normal, lung sounds diminished Abdomen soft Attention normal, affect appropriate, judgment and insight normal, generalized weakness but symmetric strength    Data Reviewed: Creatinine stable from yesterday, LFTs normal   Family Communication:     Disposition: Status is: Inpatient         Author: Ephriam Hashimoto, MD 09/05/2023 3:57 PM  For on call review [email or web address].

## 2023-09-05 NOTE — Progress Notes (Signed)
 ANTICOAGULATION CONSULT NOTE Pharmacy Consult for Heparin  Indication: atrial fibrillation and DVT   Allergies  Allergen Reactions   Ms Contin [Morphine] Hives and Rash         Patient Measurements: Height: 5\' 1"  (154.9 cm) Weight: 130.1 kg (286 lb 13.1 oz) IBW/kg (Calculated) : 47.8  Vital Signs: Temp: 97.9 F (36.6 C) (05/31 0449) Temp Source: Oral (05/31 0449) BP: 141/53 (05/31 0449) Pulse Rate: 50 (05/31 0449)  Labs: Recent Labs    09/03/23 0949 09/03/23 0955 09/03/23 1211 09/04/23 0406 09/04/23 1151 09/05/23 0439  HGB 11.7* 12.6  --  10.0*  --  10.2*  HCT 39.1 37.0  --  32.3*  --  32.7*  PLT 122*  --   --  115*  --  113*  LABPROT 19.9*  --   --  22.7*  --  19.8*  INR 1.7*  --   --  2.0*  --  1.7*  HEPARINUNFRC  --   --   --  0.38 0.33 0.28*  CREATININE 1.69*  --   --  1.99*  --  1.91*  TROPONINIHS 123*  --  131*  --   --   --     Estimated Creatinine Clearance: 36.4 mL/min (A) (by C-G formula based on SCr of 1.91 mg/dL (H)).  Assessment: 68 yo female with a history DVT/PE and Afib for heparin .    5/31 AM: Heparin  level subtherapeutic at 0.28 while on 1200 units/hr. Hgb stable at 10.2, PLT stable at 113. Vascular ultrasound consistent with acute DVT involving L common femoral vein and acute superficial thrombosis involving L great saphenous vein. No issues with infusion and no new bleeding noted per RN. Noted plans for cardiac catheterization on 09/07/23.   Goal of Therapy:  Heparin  level 0.3-0.7 units/mL INR Goal 2-3 Monitor platelets by anticoagulation protocol: Yes   Plan:  Increase heparin  infusion to 1300 units/hr Obtain heparin  level in 8 hours  Daily heparin  level and CBC while on heparin  F/U plan for anticoagulation after cath lab  Juleen Oakland, PharmD PGY1 Pharmacy Resident 09/05/2023 7:08 AM

## 2023-09-05 NOTE — Progress Notes (Signed)
 Rounding Note   Patient Name: [name] Date of Encounter: 09/05/2023  [facility] Cardiologist: Gaylyn Keas, MD   Subjective Ongoing SOB  Scheduled Meds:  allopurinol   300 mg Oral Daily   aspirin  EC  81 mg Oral Daily   citalopram   20 mg Oral QHS   insulin  aspart  0-9 Units Subcutaneous TID WC   insulin  glargine-yfgn  20 Units Subcutaneous Daily   levothyroxine   75 mcg Oral Q0600   melatonin  3 mg Oral QHS   rosuvastatin   10 mg Oral Daily   sodium chloride  flush  3 mL Intravenous Q12H   spironolactone   12.5 mg Oral QHS   Continuous Infusions:  heparin  1,300 Units/hr (09/05/23 1017)   PRN Meds: acetaminophen  **AND** diphenhydrAMINE , diazepam , ondansetron  (ZOFRAN ) IV, sodium chloride  flush   Vital Signs  Vitals:   09/04/23 2326 09/05/23 0449 09/05/23 0726 09/05/23 0928  BP: (!) 151/50 (!) 141/53 (!) 144/58   Pulse: (!) 50 (!) 50    Resp: 14 16    Temp: 97.7 F (36.5 C) 97.9 F (36.6 C) 98.3 F (36.8 C)   TempSrc: Oral Oral Oral   SpO2: 100% 98%    Weight:    131.3 kg  Height:        Intake/Output Summary (Last 24 hours) at 09/05/2023 1338 Last data filed at 09/05/2023 1015 Gross per 24 hour  Intake 1368.05 ml  Output 1150 ml  Net 218.05 ml      09/05/2023    9:28 AM 09/04/2023    5:59 AM 09/03/2023   10:13 PM  Last 3 Weights  Weight (lbs) 289 lb 8 oz 286 lb 13.1 oz 287 lb 14.7 oz  Weight (kg) 131.316 kg 130.1 kg 130.6 kg      Telemetry V paced - Personally Reviewed  ECG  N/a - Personally Reviewed  Physical Exam  GEN: No acute distress.   Neck: No JVD Cardiac: RRR, no murmurs, rubs, or gallops.  Respiratory: Clear to auscultation bilaterally. GI: Soft, nontender, non-distended  MS: No edema; No deformity. Neuro:  Nonfocal  Psych: Normal affect   Labs High Sensitivity Troponin:   Recent Labs  Lab 09/03/23 0949 09/03/23 1211  TROPONINIHS 123* 131*     Chemistry Recent Labs  Lab 09/03/23 0949 09/03/23 0955  09/03/23 1606 09/04/23 0406 09/05/23 0439  NA 142 143  --  140 139  K 4.2 4.2  --  4.1 4.4  CL 103  --   --  103 104  CO2 23  --   --  29 28  GLUCOSE 172*  --   --  160* 138*  BUN 44*  --   --  51* 54*  CREATININE 1.69*  --   --  1.99* 1.91*  CALCIUM  9.4  --   --  8.8* 8.6*  MG  --   --  2.0  --   --   PROT 6.5  --   --   --  5.3*  ALBUMIN  4.0  --   --   --  3.2*  AST 32  --   --   --  18  ALT 24  --   --   --  15  ALKPHOS 58  --   --   --  46  BILITOT 1.3*  --   --   --  0.8  GFRNONAA 33*  --   --  27* 28*  ANIONGAP 16*  --   --  8 7  Lipids  Recent Labs  Lab 09/03/23 1606  CHOL 105  TRIG 56  HDL 50  LDLCALC 44  CHOLHDL 2.1    Hematology Recent Labs  Lab 09/03/23 0949 09/03/23 0955 09/04/23 0406 09/05/23 0439  WBC 6.7  --  5.9 5.3  RBC 3.57*  --  3.00* 3.03*  HGB 11.7* 12.6 10.0* 10.2*  HCT 39.1 37.0 32.3* 32.7*  MCV 109.5*  --  107.7* 107.9*  MCH 32.8  --  33.3 33.7  MCHC 29.9*  --  31.0 31.2  RDW 14.8  --  14.8 14.7  PLT 122*  --  115* 113*   Thyroid No results for input(s): "TSH", "FREET4" in the last 168 hours.  BNP Recent Labs  Lab 09/03/23 0949  BNP 455.1*    DDimer  Recent Labs  Lab 09/04/23 0406  DDIMER 4.03*     Radiology  ECHOCARDIOGRAM COMPLETE Result Date: 09/04/2023    ECHOCARDIOGRAM REPORT   Patient Name:   [name] Date of Exam: 09/04/2023 Medical Rec #:  [id number]        Height:       61.0 in Accession #:    [id number]       Weight:       286.8 lb Date of Birth:  09-24-1955        BSA:          2.202 m Patient Age:    67 years         BP:           162/53 mmHg Patient Gender: F                HR:           50 bpm. Exam Location:  Inpatient Procedure: 2D Echo, Cardiac Doppler, Color Doppler and Intracardiac            Opacification Agent (Both Spectral and Color Flow Doppler were            utilized during procedure). Indications:    CHF I50.9  History:        Patient has prior history of Echocardiogram examinations, most                  recent 09/09/2021. Risk Factors:Hypertension and Sleep Apnea.  Sonographer:    Jeralene Mom Referring Phys: [id number] ZANE ADAMS IMPRESSIONS  1. Left ventricular ejection fraction, by estimation, is 60 to 65%. The left ventricle has normal function. The left ventricle has no regional wall motion abnormalities. The left ventricular internal cavity size was mildly dilated. There is moderate concentric left ventricular hypertrophy. Left ventricular diastolic function could not be evaluated. Elevated left atrial pressure.  2. Right ventricular systolic function is normal. The right ventricular size is normal.  3. Left atrial size was moderately dilated.  4. Right atrial size was moderately dilated.  5. The mitral valve is normal in structure. No evidence of mitral valve regurgitation. No evidence of mitral stenosis. Moderate to severe mitral annular calcification.  6. The aortic valve is tricuspid. There is mild calcification of the aortic valve. There is mild thickening of the aortic valve. Aortic valve regurgitation is not visualized. Aortic valve sclerosis/calcification is present, without any evidence of aortic stenosis.  7. The inferior vena cava is normal in size with greater than 50% respiratory variability, suggesting right atrial pressure of 3 mmHg. FINDINGS  Left Ventricle: Left ventricular ejection fraction, by estimation, is 60 to 65%. The left ventricle has normal  function. The left ventricle has no regional wall motion abnormalities. The left ventricular internal cavity size was mildly dilated. There is  moderate concentric left ventricular hypertrophy. Left ventricular diastolic function could not be evaluated due to mitral annular calcification (moderate or greater). Left ventricular diastolic function could not be evaluated. Elevated left atrial pressure. Right Ventricle: The right ventricular size is normal. No increase in right ventricular wall thickness. Right ventricular systolic function  is normal. Left Atrium: Left atrial size was moderately dilated. Right Atrium: Right atrial size was moderately dilated. Pericardium: There is no evidence of pericardial effusion. Mitral Valve: The mitral valve is normal in structure. Moderate to severe mitral annular calcification. No evidence of mitral valve regurgitation. No evidence of mitral valve stenosis. Tricuspid Valve: The tricuspid valve is normal in structure. Tricuspid valve regurgitation is mild . No evidence of tricuspid stenosis. Aortic Valve: The aortic valve is tricuspid. There is mild calcification of the aortic valve. There is mild thickening of the aortic valve. Aortic valve regurgitation is not visualized. Aortic valve sclerosis/calcification is present, without any evidence of aortic stenosis. Aortic valve mean gradient measures 6.5 mmHg. Aortic valve peak gradient measures 12.2 mmHg. Aortic valve area, by VTI measures 2.12 cm. Pulmonic Valve: The pulmonic valve was normal in structure. Pulmonic valve regurgitation is mild. No evidence of pulmonic stenosis. Aorta: The aortic root is normal in size and structure. Venous: The inferior vena cava is normal in size with greater than 50% respiratory variability, suggesting right atrial pressure of 3 mmHg. IAS/Shunts: No atrial level shunt detected by color flow Doppler. Additional Comments: A device lead is visualized.  LEFT VENTRICLE PLAX 2D LVIDd:         5.40 cm   Diastology LVIDs:         3.50 cm   LV e' medial:    5.43 cm/s LV PW:         1.30 cm   LV E/e' medial:  23.1 LV IVS:        1.30 cm   LV e' lateral:   6.87 cm/s LVOT diam:     2.10 cm   LV E/e' lateral: 18.3 LV SV:         92 LV SV Index:   42 LVOT Area:     3.46 cm  RIGHT VENTRICLE RV S prime:     13.30 cm/s TAPSE (M-mode): 2.1 cm LEFT ATRIUM              Index        RIGHT ATRIUM           Index LA Vol (A2C):   104.0 ml 47.24 ml/m  RA Area:     25.40 cm LA Vol (A4C):   85.4 ml  38.79 ml/m  RA Volume:   87.10 ml  39.56 ml/m LA  Biplane Vol: 93.8 ml  42.61 ml/m  AORTIC VALVE                     PULMONIC VALVE AV Area (Vmax):    2.32 cm      PR End Diast Vel: 3.82 msec AV Area (Vmean):   2.18 cm AV Area (VTI):     2.12 cm AV Vmax:           174.50 cm/s AV Vmean:          119.000 cm/s AV VTI:            0.435 m AV Peak Grad:  12.2 mmHg AV Mean Grad:      6.5 mmHg LVOT Vmax:         117.00 cm/s LVOT Vmean:        74.900 cm/s LVOT VTI:          0.266 m LVOT/AV VTI ratio: 0.61  AORTA Ao Root diam: 3.00 cm Ao Asc diam:  3.50 cm MITRAL VALVE                TRICUSPID VALVE MV Area (PHT): 4.06 cm     TR Peak grad:   23.6 mmHg MV Decel Time: 187 msec     TR Vmax:        243.00 cm/s MV E velocity: 125.50 cm/s MV A velocity: 54.00 cm/s   SHUNTS MV E/A ratio:  2.32         Systemic VTI:  0.27 m                             Systemic Diam: 2.10 cm Arta Lark Electronically signed by Arta Lark Signature Date/Time: 09/04/2023/8:26:36 PM    Final    VAS US  LOWER EXTREMITY VENOUS (DVT) Result Date: 09/04/2023  Lower Venous DVT Study Patient Name:  [name]  Date of Exam:   09/04/2023 Medical Rec #: [id number]         Accession #:    [id number] Date of Birth: 19-Sep-1955         Patient Gender: F Patient Age:   62 years Exam Location:  [facility] Procedure:      VAS US  LOWER EXTREMITY VENOUS (DVT) Referring Phys: Laquita Plant --------------------------------------------------------------------------------  Indications: History of PE and DVTs. Other Indications: Discoloration of both lower extremities. Risk Factors: Obesity. Anticoagulation: Heparin . Limitations: Body habitus. Performing Technologist: Franky Ivanoff Sturdivant-Jones RDMS, RVT  Examination Guidelines: A complete evaluation includes B-mode imaging, spectral Doppler, color Doppler, and power Doppler as needed of all accessible portions of each vessel. Bilateral testing is considered an integral part of a complete examination. Limited examinations for reoccurring  indications may be performed as noted. The reflux portion of the exam is performed with the patient in reverse Trendelenburg.  +---------+---------------+---------+-----------+----------+---------------+ RIGHT    CompressibilityPhasicitySpontaneityPropertiesThrombus Aging  +---------+---------------+---------+-----------+----------+---------------+ CFV      Full           Yes      Yes                                  +---------+---------------+---------+-----------+----------+---------------+ SFJ      Full                                                         +---------+---------------+---------+-----------+----------+---------------+ FV Prox  Full                                                         +---------+---------------+---------+-----------+----------+---------------+ FV Mid   Full                                                         +---------+---------------+---------+-----------+----------+---------------+  FV DistalFull                                                         +---------+---------------+---------+-----------+----------+---------------+ PFV      Full                                                         +---------+---------------+---------+-----------+----------+---------------+ POP      Full                                                         +---------+---------------+---------+-----------+----------+---------------+ PTV                                                   patent by color +---------+---------------+---------+-----------+----------+---------------+ PERO                                                  patent by color +---------+---------------+---------+-----------+----------+---------------+   +---------+---------------+---------+-----------+----------+-------------------+ LEFT     CompressibilityPhasicitySpontaneityPropertiesThrombus Aging       +---------+---------------+---------+-----------+----------+-------------------+ CFV      None           No       No                   Acute               +---------+---------------+---------+-----------+----------+-------------------+ SFJ      Partial                                      Acute               +---------+---------------+---------+-----------+----------+-------------------+ FV Prox  Partial                                      Age Indeterminate   +---------+---------------+---------+-----------+----------+-------------------+ FV Mid   Partial                                      Age Indeterminate   +---------+---------------+---------+-----------+----------+-------------------+ FV DistalPartial                                      Age Indeterminate   +---------+---------------+---------+-----------+----------+-------------------+ PFV      Full                                                             +---------+---------------+---------+-----------+----------+-------------------+  POP      None                                         Age Indeterminate   +---------+---------------+---------+-----------+----------+-------------------+ PTV                                                   Not well visualized +---------+---------------+---------+-----------+----------+-------------------+ PERO                                                  Not well visualized +---------+---------------+---------+-----------+----------+-------------------+ Bilateral iliac veins were not visualized due to body habitus.  Summary: RIGHT: - There is no evidence of deep vein thrombosis in the lower extremity.  LEFT: - Findings consistent with acute deep vein thrombosis involving the left common femoral vein. - Findings consistent with acute superficial vein thrombosis involving the left great saphenous vein.  - Findings consistent with age indeterminate deep vein  thrombosis involving the left femoral vein, and left popliteal vein.   *See table(s) above for measurements and observations. Electronically signed by Jimmye Moulds MD on 09/04/2023 at 6:14:13 PM.    Final      Assessment & Plan  1.Acute on chronic HFpEF - 08/2023 echo: LVEF 60-65%, no WMAs, indet diastolic, normal RV, mod BAE - CXR +edema, BNP 455 - was on IV lasix , discontinued yesterday with significant uptrend in Cr.   - plans for LHC/RHC on Monday pending renal function and INR - limited diuresis prior to bump in Cr, filling pressures help assess role of HF in her symptoms.   2.Chest pain - mild trop to 131.  - RHC/LHC has been arranged for Monday pending renal function and INR.   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 Afib - coumadin  on hold, on hep gtt  5. PPM   For questions or updates, please contact [facility] Please consult [email or web address] for contact info under     Signed, Armida Lander, MD  09/05/2023, 1:38 PM

## 2023-09-05 NOTE — Progress Notes (Signed)
 ANTICOAGULATION CONSULT NOTE Pharmacy Consult for Heparin  Indication: atrial fibrillation and DVT   Allergies  Allergen Reactions   Ms Contin [Morphine] Hives and Rash         Patient Measurements: Height: 5\' 1"  (154.9 cm) Weight: 131.3 kg (289 lb 8 oz) IBW/kg (Calculated) : 47.8  Vital Signs: Temp: 98.4 F (36.9 C) (05/31 1626) Temp Source: Oral (05/31 1626) BP: 150/51 (05/31 1626) Pulse Rate: 50 (05/31 0449)  Labs: Recent Labs    09/03/23 0949 09/03/23 0955 09/03/23 0955 09/03/23 1211 09/04/23 0406 09/04/23 1151 09/05/23 0439 09/05/23 1614  HGB 11.7* 12.6  --   --  10.0*  --  10.2*  --   HCT 39.1 37.0  --   --  32.3*  --  32.7*  --   PLT 122*  --   --   --  115*  --  113*  --   LABPROT 19.9*  --   --   --  22.7*  --  19.8*  --   INR 1.7*  --   --   --  2.0*  --  1.7*  --   HEPARINUNFRC  --   --    < >  --  0.38 0.33 0.28* 0.36  CREATININE 1.69*  --   --   --  1.99*  --  1.91*  --   TROPONINIHS 123*  --   --  131*  --   --   --   --    < > = values in this interval not displayed.    Estimated Creatinine Clearance: 36.6 mL/min (A) (by C-G formula based on SCr of 1.91 mg/dL (H)).  Assessment: 68 yo female with a history DVT/PE and Afib for heparin .    5/31 AM: Heparin  level subtherapeutic at 0.28 while on 1200 units/hr. Hgb stable at 10.2, PLT stable at 113. Vascular ultrasound consistent with acute DVT involving L common femoral vein and acute superficial thrombosis involving L great saphenous vein. No issues with infusion and no new bleeding noted per RN. Noted plans for cardiac catheterization on 09/07/23.   1643: Heparin  therapeutic at 0.36  Goal of Therapy:  Heparin  level 0.3-0.7 units/mL INR Goal 2-3 Monitor platelets by anticoagulation protocol: Yes   Plan:  Continue heparin  infusion at 1300 units/hr Daily heparin  level and CBC while on heparin  LHC/RHC planned for 6/2 Eventual transition back to [facility]  Heddy Liverpool, PharmD PGY2 Critical Care Pharmacy  Resident 09/05/2023 4:43 PM

## 2023-09-06 DIAGNOSIS — I5033 Acute on chronic diastolic (congestive) heart failure: Secondary | ICD-10-CM | POA: Diagnosis not present

## 2023-09-06 LAB — BASIC METABOLIC PANEL WITH GFR
Anion gap: 8 (ref 5–15)
BUN: 68 mg/dL — ABNORMAL HIGH (ref 8–23)
CO2: 27 mmol/L (ref 22–32)
Calcium: 9 mg/dL (ref 8.9–10.3)
Chloride: 102 mmol/L (ref 98–111)
Creatinine, Ser: 2.13 mg/dL — ABNORMAL HIGH (ref 0.44–1.00)
GFR, Estimated: 25 mL/min — ABNORMAL LOW (ref >60)
Glucose, Bld: 126 mg/dL — ABNORMAL HIGH (ref 70–99)
Potassium: 4.5 mmol/L (ref 3.5–5.1)
Sodium: 137 mmol/L (ref 135–145)

## 2023-09-06 LAB — CBC
HCT: 33.1 % — ABNORMAL LOW (ref 36.0–46.0)
Hemoglobin: 10.1 g/dL — ABNORMAL LOW (ref 12.0–15.0)
MCH: 32.9 pg (ref 26.0–34.0)
MCHC: 30.5 g/dL (ref 30.0–36.0)
MCV: 107.8 fL — ABNORMAL HIGH (ref 80.0–100.0)
Platelets: 107 10*3/uL — ABNORMAL LOW (ref 150–400)
RBC: 3.07 MIL/uL — ABNORMAL LOW (ref 3.87–5.11)
RDW: 14.6 % (ref 11.5–15.5)
WBC: 5.7 10*3/uL (ref 4.0–10.5)
nRBC: 0 % (ref 0.0–0.2)

## 2023-09-06 LAB — GLUCOSE, CAPILLARY
Glucose-Capillary: 116 mg/dL — ABNORMAL HIGH (ref 70–99)
Glucose-Capillary: 163 mg/dL — ABNORMAL HIGH (ref 70–99)
Glucose-Capillary: 147 mg/dL — ABNORMAL HIGH (ref 70–99)
Glucose-Capillary: 140 mg/dL — ABNORMAL HIGH (ref 70–99)

## 2023-09-06 LAB — HEPARIN LEVEL (UNFRACTIONATED): Heparin Unfractionated: 0.39 [IU]/mL (ref 0.30–0.70)

## 2023-09-06 LAB — PROTIME-INR
INR: 1.4 — ABNORMAL HIGH (ref 0.8–1.2)
Prothrombin Time: 17.4 s — ABNORMAL HIGH (ref 11.4–15.2)

## 2023-09-06 MED ORDER — SODIUM CHLORIDE 0.9 % IV SOLN: INTRAVENOUS | Status: AC

## 2023-09-06 MED ORDER — HYDRALAZINE HCL 20 MG/ML IJ SOLN: 5.0000 mg | Freq: Three times a day (TID) | INTRAMUSCULAR | Status: DC | PRN

## 2023-09-06 NOTE — Progress Notes (Signed)
 ANTICOAGULATION CONSULT NOTE Pharmacy Consult for Heparin  Indication: atrial fibrillation and DVT   Allergies  Allergen Reactions   Ms Contin [Morphine] Hives and Rash         Patient Measurements: Height: 5\' 1"  (154.9 cm) Weight: 132.5 kg (292 lb 1.8 oz) IBW/kg (Calculated) : 47.8  Vital Signs: Temp: 97.7 F (36.5 C) (06/01 0423) Temp Source: Oral (06/01 0423) BP: 146/60 (06/01 0423) Pulse Rate: 50 (06/01 0423)  Labs: Recent Labs    09/03/23 0949 09/03/23 0955 09/03/23 1211 09/04/23 0406 09/04/23 1151 09/05/23 0439 09/05/23 1614 09/06/23 0440  HGB 11.7*   < >  --  10.0*  --  10.2*  --  10.1*  HCT 39.1   < >  --  32.3*  --  32.7*  --  33.1*  PLT 122*  --   --  115*  --  113*  --  107*  LABPROT 19.9*  --   --  22.7*  --  19.8*  --  17.4*  INR 1.7*  --   --  2.0*  --  1.7*  --  1.4*  HEPARINUNFRC  --   --   --  0.38   < > 0.28* 0.36 0.39  CREATININE 1.69*  --   --  1.99*  --  1.91*  --  2.13*  TROPONINIHS 123*  --  131*  --   --   --   --   --    < > = values in this interval not displayed.    Estimated Creatinine Clearance: 33.1 mL/min (A) (by C-G formula based on SCr of 2.13 mg/dL (H)).  Assessment: 68 yo female with a history DVT/PE and Afib for heparin .    6/1 AM: Heparin  level therapeutic at 0.39 while on 1300 units/hr. Hgb stable at 10.1, PLT stable at 107. No issues with infusion and no new bleeding noted per RN. Noted plans for cardiac catheterization on 09/07/23.   Goal of Therapy:  Heparin  level 0.3-0.7 units/mL INR Goal 2-3 Monitor platelets by anticoagulation protocol: Yes   Plan:  Continue heparin  infusion at 1300 units/hr Daily heparin  level and CBC while on heparin  LHC/RHC planned for 6/2 Eventual transition back to [facility] versus transition to [facility]  Juleen Oakland, PharmD PGY1 Pharmacy Resident 09/06/2023 8:00 AM

## 2023-09-06 NOTE — Progress Notes (Signed)
 Patient had burst of accelerated idioventricular while getting out of bed to bedside commode with assistance.  Otherwise has been ventricular paced at 50 with rare PVC.  VSS. Patient without any complaints of during the episode.  K and Mg have been WNL and BMP ordered in AM.  Patient assisted back to bed and currently resting.   Dr. Jolan Natal notified via AMION.

## 2023-09-06 NOTE — H&P (View-Only) (Signed)
 Rounding Note   Patient Name: [name] Date of Encounter: 09/06/2023  [facility] Cardiologist: Gaylyn Keas, MD   Subjective Ongoing SOB  Scheduled Meds:  allopurinol   300 mg Oral Daily   aspirin  EC  81 mg Oral Daily   citalopram   20 mg Oral QHS   insulin  aspart  0-9 Units Subcutaneous TID WC   insulin  glargine-yfgn  20 Units Subcutaneous Daily   levothyroxine   75 mcg Oral Q0600   melatonin  3 mg Oral QHS   rosuvastatin   10 mg Oral Daily   sodium chloride  flush  3 mL Intravenous Q12H   spironolactone   12.5 mg Oral QHS   Continuous Infusions:  heparin  1,300 Units/hr (09/06/23 0259)   PRN Meds: acetaminophen  **AND** diphenhydrAMINE , diazepam , ondansetron  (ZOFRAN ) IV, sodium chloride  flush   Vital Signs  Vitals:   09/05/23 1941 09/05/23 2336 09/06/23 0423 09/06/23 0755  BP: (!) 132/51 (!) 125/50 (!) 146/60 (!) 160/50  Pulse: (!) 50 (!) 50 (!) 50   Resp: 18 14 14 18   Temp: 97.7 F (36.5 C) 98 F (36.7 C) 97.7 F (36.5 C) 97.7 F (36.5 C)  TempSrc: Oral Oral Oral Oral  SpO2: 98% 99% 94%   Weight:   132.5 kg   Height:        Intake/Output Summary (Last 24 hours) at 09/06/2023 1059 Last data filed at 09/06/2023 0400 Gross per 24 hour  Intake 623.49 ml  Output 350 ml  Net 273.49 ml      09/06/2023    4:23 AM 09/05/2023    9:28 AM 09/04/2023    5:59 AM  Last 3 Weights  Weight (lbs) 292 lb 1.8 oz 289 lb 8 oz 286 lb 13.1 oz  Weight (kg) 132.5 kg 131.316 kg 130.1 kg      Telemetry V paced at 50 - Personally Reviewed  ECG  N/a - Personally Reviewed  Physical Exam  GEN: No acute distress.   Neck: No JVD Cardiac: RRR, no murmurs, rubs, or gallops.  Respiratory: Clear to auscultation bilaterally. GI: Soft, nontender, non-distended  MS: No edema; No deformity. Neuro:  Nonfocal  Psych: Normal affect   Labs High Sensitivity Troponin:   Recent Labs  Lab 09/03/23 0949 09/03/23 1211  TROPONINIHS 123* 131*     Chemistry Recent Labs  Lab  09/03/23 0949 09/03/23 0955 09/03/23 1606 09/04/23 0406 09/05/23 0439 09/06/23 0440  NA 142   < >  --  140 139 137  K 4.2   < >  --  4.1 4.4 4.5  CL 103  --   --  103 104 102  CO2 23  --   --  29 28 27   GLUCOSE 172*  --   --  160* 138* 126*  BUN 44*  --   --  51* 54* 68*  CREATININE 1.69*  --   --  1.99* 1.91* 2.13*  CALCIUM  9.4  --   --  8.8* 8.6* 9.0  MG  --   --  2.0  --   --   --   PROT 6.5  --   --   --  5.3*  --   ALBUMIN  4.0  --   --   --  3.2*  --   AST 32  --   --   --  18  --   ALT 24  --   --   --  15  --   ALKPHOS 58  --   --   --  46  --   BILITOT 1.3*  --   --   --  0.8  --   GFRNONAA 33*  --   --  27* 28* 25*  ANIONGAP 16*  --   --  8 7 8    < > = values in this interval not displayed.    Lipids  Recent Labs  Lab 09/03/23 1606  CHOL 105  TRIG 56  HDL 50  LDLCALC 44  CHOLHDL 2.1    Hematology Recent Labs  Lab 09/04/23 0406 09/05/23 0439 09/06/23 0440  WBC 5.9 5.3 5.7  RBC 3.00* 3.03* 3.07*  HGB 10.0* 10.2* 10.1*  HCT 32.3* 32.7* 33.1*  MCV 107.7* 107.9* 107.8*  MCH 33.3 33.7 32.9  MCHC 31.0 31.2 30.5  RDW 14.8 14.7 14.6  PLT 115* 113* 107*   Thyroid No results for input(s): "TSH", "FREET4" in the last 168 hours.  BNP Recent Labs  Lab 09/03/23 0949  BNP 455.1*    DDimer  Recent Labs  Lab 09/04/23 0406  DDIMER 4.03*     Radiology  CT CHEST WO CONTRAST Result Date: 09/05/2023 CLINICAL DATA:  Dyspnea, chronic, unclear in etiology. Shortness of breath. EXAM: CT CHEST WITHOUT CONTRAST TECHNIQUE: Multidetector CT imaging of the chest was performed following the standard protocol without IV contrast. RADIATION DOSE REDUCTION: This exam was performed according to the departmental dose-optimization program which includes automated exposure control, adjustment of the mA and/or kV according to patient size and/or use of iterative reconstruction technique. COMPARISON:  09/14/2015. FINDINGS: Cardiovascular: The heart is enlarged and there is a trace  pericardial effusion. A pacemaker lead is noted in the heart. There is calcification of the mitral valve annulus. Atherosclerotic calcification of the aorta is noted without evidence of aneurysm. The pulmonary trunk is distended suggesting underlying pulmonary artery hypertension. Mediastinum/Nodes: No mediastinal or axillary lymphadenopathy. Evaluation of the hila is limited due to lack of IV contrast. The thyroid gland, trachea, and esophagus are within normal limits. Lungs/Pleura: There is a small right pleural effusion. Scattered ground-glass opacities are noted in the lungs bilaterally. Minimal atelectasis or infiltrate is present in the right lower lobe. No significant pulmonary nodule or mass is seen. Upper Abdomen: The gallbladder is surgically absent. No acute abnormality. Musculoskeletal: A pacemaker device is present in the anterior left chest. Degenerative changes are present in the thoracic spine. No acute osseous abnormality is seen. IMPRESSION: 1. Scattered ground-glass opacities in the lungs bilaterally, possible edema or infiltrate. 2. Small right pleural effusion. 3. Distended pulmonary trunk suggesting underlying pulmonary artery hypertension. 4. Scattered coronary artery calcifications. 5. Aortic atherosclerosis. Electronically Signed   By: Wyvonnia Heimlich M.D.   On: 09/05/2023 17:12   VAS US  LOWER EXTREMITY VENOUS (DVT) Result Date: 09/04/2023  Lower Venous DVT Study Patient Name:  [name]  Date of Exam:   09/04/2023 Medical Rec #: [id number]         Accession #:    [id number] Date of Birth: 1955/04/24         Patient Gender: F Patient Age:   31 years Exam Location:  [facility] Procedure:      VAS US  LOWER EXTREMITY VENOUS (DVT) Referring Phys: Laquita Plant --------------------------------------------------------------------------------  Indications: History of PE and DVTs. Other Indications: Discoloration of both lower extremities. Risk Factors: Obesity. Anticoagulation:  Heparin . Limitations: Body habitus. Performing Technologist: Franky Ivanoff Sturdivant-Jones RDMS, RVT  Examination Guidelines: A complete evaluation includes B-mode imaging, spectral Doppler, color Doppler, and power Doppler as needed of all  accessible portions of each vessel. Bilateral testing is considered an integral part of a complete examination. Limited examinations for reoccurring indications may be performed as noted. The reflux portion of the exam is performed with the patient in reverse Trendelenburg.  +---------+---------------+---------+-----------+----------+---------------+ RIGHT    CompressibilityPhasicitySpontaneityPropertiesThrombus Aging  +---------+---------------+---------+-----------+----------+---------------+ CFV      Full           Yes      Yes                                  +---------+---------------+---------+-----------+----------+---------------+ SFJ      Full                                                         +---------+---------------+---------+-----------+----------+---------------+ FV Prox  Full                                                         +---------+---------------+---------+-----------+----------+---------------+ FV Mid   Full                                                         +---------+---------------+---------+-----------+----------+---------------+ FV DistalFull                                                         +---------+---------------+---------+-----------+----------+---------------+ PFV      Full                                                         +---------+---------------+---------+-----------+----------+---------------+ POP      Full                                                         +---------+---------------+---------+-----------+----------+---------------+ PTV                                                   patent by color  +---------+---------------+---------+-----------+----------+---------------+ PERO                                                  patent by color +---------+---------------+---------+-----------+----------+---------------+   +---------+---------------+---------+-----------+----------+-------------------+ LEFT  CompressibilityPhasicitySpontaneityPropertiesThrombus Aging      +---------+---------------+---------+-----------+----------+-------------------+ CFV      None           No       No                   Acute               +---------+---------------+---------+-----------+----------+-------------------+ SFJ      Partial                                      Acute               +---------+---------------+---------+-----------+----------+-------------------+ FV Prox  Partial                                      Age Indeterminate   +---------+---------------+---------+-----------+----------+-------------------+ FV Mid   Partial                                      Age Indeterminate   +---------+---------------+---------+-----------+----------+-------------------+ FV DistalPartial                                      Age Indeterminate   +---------+---------------+---------+-----------+----------+-------------------+ PFV      Full                                                             +---------+---------------+---------+-----------+----------+-------------------+ POP      None                                         Age Indeterminate   +---------+---------------+---------+-----------+----------+-------------------+ PTV                                                   Not well visualized +---------+---------------+---------+-----------+----------+-------------------+ PERO                                                  Not well visualized +---------+---------------+---------+-----------+----------+-------------------+ Bilateral iliac  veins were not visualized due to body habitus.  Summary: RIGHT: - There is no evidence of deep vein thrombosis in the lower extremity.  LEFT: - Findings consistent with acute deep vein thrombosis involving the left common femoral vein. - Findings consistent with acute superficial vein thrombosis involving the left great saphenous vein.  - Findings consistent with age indeterminate deep vein thrombosis involving the left femoral vein, and left popliteal vein.   *See table(s) above for measurements and observations. Electronically signed by Jimmye Moulds MD on 09/04/2023 at 6:14:13 PM.    Final       Assessment &  Plan  1.Acute on chronic HFpEF - 08/2023 echo: LVEF 60-65%, no WMAs, indet diastolic, normal RV, mod BAE - CXR +edema, BNP 455 - was on IV lasix , discontinued  with significant uptrend in Cr.    - plans for LHC/RHC on Monday pending renal function and INR - limited diuresis prior to bump in Cr, filling pressures will help assess role of HF in her symptoms.  - body habitus prohibits volume assessment by exam.     2.Chest pain - mild trop to 131.  - RHC/LHC had been previously arranged for Monday pending renal function and INR.    3. History of DVT/PE. Prior IVC filter - on coumadin  at home - coumadin  on hold, on IV heparin   This admit left common femoral vein DVT, has IVC filter.      4. Afib - coumadin  on hold, on hep gtt   5. PPM  6. AKI on CKD - baseline Cr around 1.7, up to 2.13 this admission - holding diuretics.    For questions or updates, please contact [facility] Please consult [email or web address] for contact info under     Signed, Armida Lander, MD  09/06/2023, 10:59 AM

## 2023-09-06 NOTE — Progress Notes (Signed)
  Progress Note   Patient: [name] WUJ:[id number] DOB: 1955/11/23 DOA: 09/03/2023     3 DOS: the patient was seen and examined on 09/06/2023 at 1:45 PM      Brief hospital course: 68 y.o. F with MO, CKD IIIb baseline 1.5, HTN, HLD, DM, COPD, dCHF, pAF on warfarin, CHB s/p PPM, hx DVT/PE with IVC, and hypothyroidism who presented with CHF flare.     Assessment and Plan: * Acute on chronic heart failure with preserved ejection fraction (HFpEF) (HCC) See prior note, physical exam unchanged. - Hold Lasix  with rising creatinine - Plan for RHC/LHC tomorrow, pending creatinine/INR    Elevated troponin Myocardial injury due to CHF Coronary artery disease Hypertension Hyperlipidemia BP elevated again today - Torsemide , spironolactone , losartan  on hold with rising creatinine - Defer beta-blocker to cardiology - Continue aspirin  and Crestor  - Continue heparin  infusion   - Plan for heart cath Monday -As needed hydralazine  for severe range pressure  Chronic kidney disease stage IIIb Baseline is mid-high 1s, trended up to 2.1 today - Hold diuretics and losartan  - Gentle IVF today  Paroxysmal atrial fibrillation (HCC) Chronic heart block Subtherapeutic INR - Hold warfarin for now - Continue heparin  for heart cath  Anemia Thrombocytopenia Stable  History of pulmonary embolism, recurrent, 3 seperate episodes. History of IVC filter Acute DVT -Heparin  drip  Type 2 diabetes mellitus with stage 3b chronic kidney disease, with long-term current use of insulin  (HCC) Control - Continue glargine and sliding scale corrections  Hypothyroidism - Continue levothyroxine   Obstructive sleep apnea Intolerant of CPAP  Morbid obesity with BMI of 50.0-59.9, adult (HCC) Class III, BMI 54, complicates care          Subjective: Patient is fatigued today, sleepy.  No chest pain, palpitations, change in breathing.  Trending up     Physical Exam: BP (!) 151/54 (BP  Location: Left Wrist)   Pulse (!) 50   Temp 98.5 F (36.9 C) (Oral)   Resp 15   Ht 5\' 1"  (1.549 m)   Wt 132.5 kg   SpO2 94%   BMI 55.19 kg/m   Obese adult female, lying in bed, interactive and appropriate RRR, soft systolic murmur, chronic venous stasis change of both legs, volume status difficult to assess Respiratory rate normal, lung sounds diminished but no rales or wheezes appreciated Abdomen soft no tenderness palpation or guarding Attention normal, affect sleepy, judgment and insight appear normal, generalized weakness but symmetric strength, oriented to person, place, and time    Data Reviewed: Glucose normal Basic metabolic panel shows creatinine up to 2.1, otherwise electrolytes normal CBC shows mild anemia, mild thrombocytopenia   Family Communication: None present    Disposition: Status is: Inpatient         Author: Ephriam Hashimoto, MD 09/06/2023 2:22 PM  For on call review [email or web address].

## 2023-09-06 NOTE — Progress Notes (Signed)
 Rounding Note   Patient Name: [name] Date of Encounter: 09/06/2023  [facility] Cardiologist: Gaylyn Keas, MD   Subjective Ongoing SOB  Scheduled Meds:  allopurinol   300 mg Oral Daily   aspirin  EC  81 mg Oral Daily   citalopram   20 mg Oral QHS   insulin  aspart  0-9 Units Subcutaneous TID WC   insulin  glargine-yfgn  20 Units Subcutaneous Daily   levothyroxine   75 mcg Oral Q0600   melatonin  3 mg Oral QHS   rosuvastatin   10 mg Oral Daily   sodium chloride  flush  3 mL Intravenous Q12H   spironolactone   12.5 mg Oral QHS   Continuous Infusions:  heparin  1,300 Units/hr (09/06/23 0259)   PRN Meds: acetaminophen  **AND** diphenhydrAMINE , diazepam , ondansetron  (ZOFRAN ) IV, sodium chloride  flush   Vital Signs  Vitals:   09/05/23 1941 09/05/23 2336 09/06/23 0423 09/06/23 0755  BP: (!) 132/51 (!) 125/50 (!) 146/60 (!) 160/50  Pulse: (!) 50 (!) 50 (!) 50   Resp: 18 14 14 18   Temp: 97.7 F (36.5 C) 98 F (36.7 C) 97.7 F (36.5 C) 97.7 F (36.5 C)  TempSrc: Oral Oral Oral Oral  SpO2: 98% 99% 94%   Weight:   132.5 kg   Height:        Intake/Output Summary (Last 24 hours) at 09/06/2023 1059 Last data filed at 09/06/2023 0400 Gross per 24 hour  Intake 623.49 ml  Output 350 ml  Net 273.49 ml      09/06/2023    4:23 AM 09/05/2023    9:28 AM 09/04/2023    5:59 AM  Last 3 Weights  Weight (lbs) 292 lb 1.8 oz 289 lb 8 oz 286 lb 13.1 oz  Weight (kg) 132.5 kg 131.316 kg 130.1 kg      Telemetry V paced at 50 - Personally Reviewed  ECG  N/a - Personally Reviewed  Physical Exam  GEN: No acute distress.   Neck: No JVD Cardiac: RRR, no murmurs, rubs, or gallops.  Respiratory: Clear to auscultation bilaterally. GI: Soft, nontender, non-distended  MS: No edema; No deformity. Neuro:  Nonfocal  Psych: Normal affect   Labs High Sensitivity Troponin:   Recent Labs  Lab 09/03/23 0949 09/03/23 1211  TROPONINIHS 123* 131*     Chemistry Recent Labs  Lab  09/03/23 0949 09/03/23 0955 09/03/23 1606 09/04/23 0406 09/05/23 0439 09/06/23 0440  NA 142   < >  --  140 139 137  K 4.2   < >  --  4.1 4.4 4.5  CL 103  --   --  103 104 102  CO2 23  --   --  29 28 27   GLUCOSE 172*  --   --  160* 138* 126*  BUN 44*  --   --  51* 54* 68*  CREATININE 1.69*  --   --  1.99* 1.91* 2.13*  CALCIUM  9.4  --   --  8.8* 8.6* 9.0  MG  --   --  2.0  --   --   --   PROT 6.5  --   --   --  5.3*  --   ALBUMIN  4.0  --   --   --  3.2*  --   AST 32  --   --   --  18  --   ALT 24  --   --   --  15  --   ALKPHOS 58  --   --   --  46  --   BILITOT 1.3*  --   --   --  0.8  --   GFRNONAA 33*  --   --  27* 28* 25*  ANIONGAP 16*  --   --  8 7 8    < > = values in this interval not displayed.    Lipids  Recent Labs  Lab 09/03/23 1606  CHOL 105  TRIG 56  HDL 50  LDLCALC 44  CHOLHDL 2.1    Hematology Recent Labs  Lab 09/04/23 0406 09/05/23 0439 09/06/23 0440  WBC 5.9 5.3 5.7  RBC 3.00* 3.03* 3.07*  HGB 10.0* 10.2* 10.1*  HCT 32.3* 32.7* 33.1*  MCV 107.7* 107.9* 107.8*  MCH 33.3 33.7 32.9  MCHC 31.0 31.2 30.5  RDW 14.8 14.7 14.6  PLT 115* 113* 107*   Thyroid No results for input(s): "TSH", "FREET4" in the last 168 hours.  BNP Recent Labs  Lab 09/03/23 0949  BNP 455.1*    DDimer  Recent Labs  Lab 09/04/23 0406  DDIMER 4.03*     Radiology  CT CHEST WO CONTRAST Result Date: 09/05/2023 CLINICAL DATA:  Dyspnea, chronic, unclear in etiology. Shortness of breath. EXAM: CT CHEST WITHOUT CONTRAST TECHNIQUE: Multidetector CT imaging of the chest was performed following the standard protocol without IV contrast. RADIATION DOSE REDUCTION: This exam was performed according to the departmental dose-optimization program which includes automated exposure control, adjustment of the mA and/or kV according to patient size and/or use of iterative reconstruction technique. COMPARISON:  09/14/2015. FINDINGS: Cardiovascular: The heart is enlarged and there is a trace  pericardial effusion. A pacemaker lead is noted in the heart. There is calcification of the mitral valve annulus. Atherosclerotic calcification of the aorta is noted without evidence of aneurysm. The pulmonary trunk is distended suggesting underlying pulmonary artery hypertension. Mediastinum/Nodes: No mediastinal or axillary lymphadenopathy. Evaluation of the hila is limited due to lack of IV contrast. The thyroid gland, trachea, and esophagus are within normal limits. Lungs/Pleura: There is a small right pleural effusion. Scattered ground-glass opacities are noted in the lungs bilaterally. Minimal atelectasis or infiltrate is present in the right lower lobe. No significant pulmonary nodule or mass is seen. Upper Abdomen: The gallbladder is surgically absent. No acute abnormality. Musculoskeletal: A pacemaker device is present in the anterior left chest. Degenerative changes are present in the thoracic spine. No acute osseous abnormality is seen. IMPRESSION: 1. Scattered ground-glass opacities in the lungs bilaterally, possible edema or infiltrate. 2. Small right pleural effusion. 3. Distended pulmonary trunk suggesting underlying pulmonary artery hypertension. 4. Scattered coronary artery calcifications. 5. Aortic atherosclerosis. Electronically Signed   By: Wyvonnia Heimlich M.D.   On: 09/05/2023 17:12   VAS US  LOWER EXTREMITY VENOUS (DVT) Result Date: 09/04/2023  Lower Venous DVT Study Patient Name:  [name]  Date of Exam:   09/04/2023 Medical Rec #: [id number]         Accession #:    [id number] Date of Birth: 1955/04/24         Patient Gender: F Patient Age:   68 years Exam Location:  [facility] Procedure:      VAS US  LOWER EXTREMITY VENOUS (DVT) Referring Phys: Laquita Plant --------------------------------------------------------------------------------  Indications: History of PE and DVTs. Other Indications: Discoloration of both lower extremities. Risk Factors: Obesity. Anticoagulation:  Heparin . Limitations: Body habitus. Performing Technologist: Franky Ivanoff Sturdivant-Jones RDMS, RVT  Examination Guidelines: A complete evaluation includes B-mode imaging, spectral Doppler, color Doppler, and power Doppler as needed of all  accessible portions of each vessel. Bilateral testing is considered an integral part of a complete examination. Limited examinations for reoccurring indications may be performed as noted. The reflux portion of the exam is performed with the patient in reverse Trendelenburg.  +---------+---------------+---------+-----------+----------+---------------+ RIGHT    CompressibilityPhasicitySpontaneityPropertiesThrombus Aging  +---------+---------------+---------+-----------+----------+---------------+ CFV      Full           Yes      Yes                                  +---------+---------------+---------+-----------+----------+---------------+ SFJ      Full                                                         +---------+---------------+---------+-----------+----------+---------------+ FV Prox  Full                                                         +---------+---------------+---------+-----------+----------+---------------+ FV Mid   Full                                                         +---------+---------------+---------+-----------+----------+---------------+ FV DistalFull                                                         +---------+---------------+---------+-----------+----------+---------------+ PFV      Full                                                         +---------+---------------+---------+-----------+----------+---------------+ POP      Full                                                         +---------+---------------+---------+-----------+----------+---------------+ PTV                                                   patent by color  +---------+---------------+---------+-----------+----------+---------------+ PERO                                                  patent by color +---------+---------------+---------+-----------+----------+---------------+   +---------+---------------+---------+-----------+----------+-------------------+ LEFT  CompressibilityPhasicitySpontaneityPropertiesThrombus Aging      +---------+---------------+---------+-----------+----------+-------------------+ CFV      None           No       No                   Acute               +---------+---------------+---------+-----------+----------+-------------------+ SFJ      Partial                                      Acute               +---------+---------------+---------+-----------+----------+-------------------+ FV Prox  Partial                                      Age Indeterminate   +---------+---------------+---------+-----------+----------+-------------------+ FV Mid   Partial                                      Age Indeterminate   +---------+---------------+---------+-----------+----------+-------------------+ FV DistalPartial                                      Age Indeterminate   +---------+---------------+---------+-----------+----------+-------------------+ PFV      Full                                                             +---------+---------------+---------+-----------+----------+-------------------+ POP      None                                         Age Indeterminate   +---------+---------------+---------+-----------+----------+-------------------+ PTV                                                   Not well visualized +---------+---------------+---------+-----------+----------+-------------------+ PERO                                                  Not well visualized +---------+---------------+---------+-----------+----------+-------------------+ Bilateral iliac  veins were not visualized due to body habitus.  Summary: RIGHT: - There is no evidence of deep vein thrombosis in the lower extremity.  LEFT: - Findings consistent with acute deep vein thrombosis involving the left common femoral vein. - Findings consistent with acute superficial vein thrombosis involving the left great saphenous vein.  - Findings consistent with age indeterminate deep vein thrombosis involving the left femoral vein, and left popliteal vein.   *See table(s) above for measurements and observations. Electronically signed by Jimmye Moulds MD on 09/04/2023 at 6:14:13 PM.    Final       Assessment &  Plan  1.Acute on chronic HFpEF - 08/2023 echo: LVEF 60-65%, no WMAs, indet diastolic, normal RV, mod BAE - CXR +edema, BNP 455 - was on IV lasix , discontinued  with significant uptrend in Cr.    - plans for LHC/RHC on Monday pending renal function and INR - limited diuresis prior to bump in Cr, filling pressures will help assess role of HF in her symptoms.  - body habitus prohibits volume assessment by exam.     2.Chest pain - mild trop to 131.  - RHC/LHC had been previously arranged for Monday pending renal function and INR.    3. History of DVT/PE. Prior IVC filter - on coumadin  at home - coumadin  on hold, on IV heparin   This admit left common femoral vein DVT, has IVC filter.      4. Afib - coumadin  on hold, on hep gtt   5. PPM  6. AKI on CKD - baseline Cr around 1.7, up to 2.13 this admission - holding diuretics.    For questions or updates, please contact [facility] Please consult [email or web address] for contact info under     Signed, Armida Lander, MD  09/06/2023, 10:59 AM

## 2023-09-07 ENCOUNTER — Encounter (HOSPITAL_COMMUNITY): Payer: Self-pay | Admitting: Internal Medicine

## 2023-09-07 ENCOUNTER — Encounter (HOSPITAL_COMMUNITY)
Admission: EM | Disposition: A | Discharge status: Home Health | Payer: Self-pay | Source: Home / Self Care | Attending: Family Medicine

## 2023-09-07 DIAGNOSIS — E662 Morbid (severe) obesity with alveolar hypoventilation: Secondary | ICD-10-CM

## 2023-09-07 DIAGNOSIS — I272 Pulmonary hypertension, unspecified: Secondary | ICD-10-CM

## 2023-09-07 DIAGNOSIS — R0609 Other forms of dyspnea: Secondary | ICD-10-CM

## 2023-09-07 DIAGNOSIS — R0602 Shortness of breath: Secondary | ICD-10-CM

## 2023-09-07 DIAGNOSIS — I2723 Pulmonary hypertension due to lung diseases and hypoxia: Secondary | ICD-10-CM

## 2023-09-07 DIAGNOSIS — I5033 Acute on chronic diastolic (congestive) heart failure: Secondary | ICD-10-CM | POA: Diagnosis not present

## 2023-09-07 HISTORY — PX: RIGHT/LEFT HEART CATH AND CORONARY ANGIOGRAPHY: CATH118266

## 2023-09-07 LAB — BASIC METABOLIC PANEL WITH GFR
Anion gap: 8 (ref 5–15)
BUN: 73 mg/dL — ABNORMAL HIGH (ref 8–23)
CO2: 27 mmol/L (ref 22–32)
Calcium: 8.8 mg/dL — ABNORMAL LOW (ref 8.9–10.3)
Chloride: 101 mmol/L (ref 98–111)
Creatinine, Ser: 1.92 mg/dL — ABNORMAL HIGH (ref 0.44–1.00)
GFR, Estimated: 28 mL/min — ABNORMAL LOW (ref >60)
Glucose, Bld: 125 mg/dL — ABNORMAL HIGH (ref 70–99)
Potassium: 4.3 mmol/L (ref 3.5–5.1)
Sodium: 136 mmol/L (ref 135–145)

## 2023-09-07 LAB — POCT I-STAT EG7
Acid-Base Excess: 0 mmol/L (ref 0.0–2.0)
Acid-base deficit: 2 mmol/L (ref 0.0–2.0)
Bicarbonate: 27.8 mmol/L (ref 20.0–28.0)
Bicarbonate: 26.2 mmol/L (ref 20.0–28.0)
Calcium, Ion: 1.26 mmol/L (ref 1.15–1.40)
Calcium, Ion: 1.23 mmol/L (ref 1.15–1.40)
HCT: 33 % — ABNORMAL LOW (ref 36.0–46.0)
HCT: 33 % — ABNORMAL LOW (ref 36.0–46.0)
Hemoglobin: 11.2 g/dL — ABNORMAL LOW (ref 12.0–15.0)
Hemoglobin: 11.2 g/dL — ABNORMAL LOW (ref 12.0–15.0)
O2 Saturation: 39 %
O2 Saturation: 46 %
Potassium: 4.5 mmol/L (ref 3.5–5.1)
Potassium: 4.4 mmol/L (ref 3.5–5.1)
Sodium: 138 mmol/L (ref 135–145)
Sodium: 135 mmol/L (ref 135–145)
TCO2: 30 mmol/L (ref 22–32)
TCO2: 28 mmol/L (ref 22–32)
pCO2, Ven: 62.2 mmHg — ABNORMAL HIGH (ref 44–60)
pCO2, Ven: 60.4 mmHg — ABNORMAL HIGH (ref 44–60)
pH, Ven: 7.258 (ref 7.25–7.43)
pH, Ven: 7.245 — ABNORMAL LOW (ref 7.25–7.43)
pO2, Ven: 27 mmHg — CL (ref 32–45)
pO2, Ven: 30 mmHg — CL (ref 32–45)

## 2023-09-07 LAB — POCT I-STAT 7, (LYTES, BLD GAS, ICA,H+H)
Acid-base deficit: 1 mmol/L (ref 0.0–2.0)
Acid-base deficit: 2 mmol/L (ref 0.0–2.0)
Bicarbonate: 26.8 mmol/L (ref 20.0–28.0)
Bicarbonate: 25 mmol/L (ref 20.0–28.0)
Calcium, Ion: 1.26 mmol/L (ref 1.15–1.40)
Calcium, Ion: 1.2 mmol/L (ref 1.15–1.40)
HCT: 32 % — ABNORMAL LOW (ref 36.0–46.0)
HCT: 31 % — ABNORMAL LOW (ref 36.0–46.0)
Hemoglobin: 10.9 g/dL — ABNORMAL LOW (ref 12.0–15.0)
Hemoglobin: 10.5 g/dL — ABNORMAL LOW (ref 12.0–15.0)
O2 Saturation: 60 %
O2 Saturation: 81 %
Potassium: 4.5 mmol/L (ref 3.5–5.1)
Potassium: 4.6 mmol/L (ref 3.5–5.1)
Sodium: 139 mmol/L (ref 135–145)
Sodium: 139 mmol/L (ref 135–145)
TCO2: 29 mmol/L (ref 22–32)
TCO2: 27 mmol/L (ref 22–32)
pCO2 arterial: 58.1 mmHg — ABNORMAL HIGH (ref 32–48)
pCO2 arterial: 50 mmHg — ABNORMAL HIGH (ref 32–48)
pH, Arterial: 7.271 — ABNORMAL LOW (ref 7.35–7.45)
pH, Arterial: 7.308 — ABNORMAL LOW (ref 7.35–7.45)
pO2, Arterial: 36 mmHg — CL (ref 83–108)
pO2, Arterial: 50 mmHg — ABNORMAL LOW (ref 83–108)

## 2023-09-07 LAB — CBC
HCT: 34.1 % — ABNORMAL LOW (ref 36.0–46.0)
Hemoglobin: 10.4 g/dL — ABNORMAL LOW (ref 12.0–15.0)
MCH: 32.7 pg (ref 26.0–34.0)
MCHC: 30.5 g/dL (ref 30.0–36.0)
MCV: 107.2 fL — ABNORMAL HIGH (ref 80.0–100.0)
Platelets: 103 10*3/uL — ABNORMAL LOW (ref 150–400)
RBC: 3.18 MIL/uL — ABNORMAL LOW (ref 3.87–5.11)
RDW: 14.4 % (ref 11.5–15.5)
WBC: 5.7 10*3/uL (ref 4.0–10.5)
nRBC: 0 % (ref 0.0–0.2)

## 2023-09-07 LAB — GLUCOSE, CAPILLARY
Glucose-Capillary: 109 mg/dL — ABNORMAL HIGH (ref 70–99)
Glucose-Capillary: 147 mg/dL — ABNORMAL HIGH (ref 70–99)
Glucose-Capillary: 148 mg/dL — ABNORMAL HIGH (ref 70–99)
Glucose-Capillary: 139 mg/dL — ABNORMAL HIGH (ref 70–99)

## 2023-09-07 LAB — PROCALCITONIN: Procalcitonin: 0.16 ng/mL

## 2023-09-07 LAB — PROTIME-INR
INR: 1.3 — ABNORMAL HIGH (ref 0.8–1.2)
Prothrombin Time: 16.4 s — ABNORMAL HIGH (ref 11.4–15.2)

## 2023-09-07 LAB — HEPARIN LEVEL (UNFRACTIONATED): Heparin Unfractionated: 0.37 [IU]/mL (ref 0.30–0.70)

## 2023-09-07 SURGERY — RIGHT/LEFT HEART CATH AND CORONARY ANGIOGRAPHY
Anesthesia: LOCAL

## 2023-09-07 MED ORDER — HYDRALAZINE HCL 20 MG/ML IJ SOLN: 10.0000 mg | INTRAMUSCULAR | Status: AC | PRN

## 2023-09-07 MED ORDER — VERAPAMIL HCL 2.5 MG/ML IV SOLN
INTRAVENOUS | Status: AC
  Filled 2023-09-07: qty 2

## 2023-09-07 MED ORDER — IOHEXOL 350 MG/ML SOLN
INTRAVENOUS | Status: DC | PRN
  Administered 2023-09-07: 28 mL

## 2023-09-07 MED ORDER — FENTANYL CITRATE (PF) 100 MCG/2ML IJ SOLN
INTRAMUSCULAR | Status: AC
  Filled 2023-09-07: qty 2

## 2023-09-07 MED ORDER — VERAPAMIL HCL 2.5 MG/ML IV SOLN
INTRAVENOUS | Status: DC | PRN
  Administered 2023-09-07: 10 mL via INTRA_ARTERIAL

## 2023-09-07 MED ORDER — MIDAZOLAM HCL 2 MG/2ML IJ SOLN
INTRAMUSCULAR | Status: AC
  Filled 2023-09-07: qty 2

## 2023-09-07 MED ORDER — FENTANYL CITRATE (PF) 100 MCG/2ML IJ SOLN
INTRAMUSCULAR | Status: DC | PRN
  Administered 2023-09-07 (×3): 25 ug via INTRAVENOUS

## 2023-09-07 MED ORDER — HEPARIN SODIUM (PORCINE) 1000 UNIT/ML IJ SOLN
INTRAMUSCULAR | Status: DC | PRN
  Administered 2023-09-07: 5000 [IU] via INTRAVENOUS

## 2023-09-07 MED ORDER — MIDAZOLAM HCL 2 MG/2ML IJ SOLN
INTRAMUSCULAR | Status: AC
Start: 2023-09-07 — End: ?
  Filled 2023-09-07: qty 2

## 2023-09-07 MED ORDER — LIDOCAINE HCL (PF) 1 % IJ SOLN
INTRAMUSCULAR | Status: AC
  Filled 2023-09-07: qty 30

## 2023-09-07 MED ORDER — APIXABAN 5 MG PO TABS
10.0000 mg | ORAL_TABLET | Freq: Two times a day (BID) | ORAL | Status: DC
  Administered 2023-09-07 – 2023-09-08 (×2): 10 mg via ORAL
  Filled 2023-09-07 (×2): qty 2

## 2023-09-07 MED ORDER — APIXABAN 5 MG PO TABS: 5.0000 mg | ORAL_TABLET | Freq: Two times a day (BID) | ORAL | Status: DC

## 2023-09-07 MED ORDER — LABETALOL HCL 5 MG/ML IV SOLN: 10.0000 mg | INTRAVENOUS | Status: AC | PRN

## 2023-09-07 MED ORDER — HEPARIN (PORCINE) IN NACL 1000-0.9 UT/500ML-% IV SOLN
INTRAVENOUS | Status: DC | PRN
  Administered 2023-09-07 (×2): 500 mL

## 2023-09-07 MED ORDER — SODIUM CHLORIDE 0.9 % IV SOLN: INTRAVENOUS | Status: DC

## 2023-09-07 MED ORDER — SODIUM CHLORIDE 0.9 % IV SOLN
250.0000 mL | INTRAVENOUS | Status: AC | PRN
Start: 2023-09-07 — End: 2023-09-08

## 2023-09-07 MED ORDER — ACETAMINOPHEN 325 MG PO TABS
650.0000 mg | ORAL_TABLET | ORAL | Status: DC | PRN
Start: 2023-09-07 — End: 2023-09-08
  Administered 2023-09-07: 650 mg via ORAL
  Filled 2023-09-07: qty 2

## 2023-09-07 MED ORDER — HEPARIN SODIUM (PORCINE) 1000 UNIT/ML IJ SOLN
INTRAMUSCULAR | Status: AC
  Filled 2023-09-07: qty 10

## 2023-09-07 MED ORDER — SODIUM CHLORIDE 0.9% FLUSH
3.0000 mL | Freq: Two times a day (BID) | INTRAVENOUS | Status: DC
  Administered 2023-09-07 (×2): 3 mL via INTRAVENOUS

## 2023-09-07 MED ORDER — MIDAZOLAM HCL 2 MG/2ML IJ SOLN
INTRAMUSCULAR | Status: DC | PRN
  Administered 2023-09-07 (×3): 1 mg via INTRAVENOUS

## 2023-09-07 MED ORDER — LIDOCAINE HCL (PF) 1 % IJ SOLN
INTRAMUSCULAR | Status: DC | PRN
  Administered 2023-09-07: 5 mL via INTRADERMAL
  Administered 2023-09-07: 2 mL via INTRADERMAL

## 2023-09-07 MED ORDER — SODIUM CHLORIDE 0.9% FLUSH: 3.0000 mL | INTRAVENOUS | Status: DC | PRN

## 2023-09-07 SURGICAL SUPPLY — 12 items
CATH BALLN WEDGE 5F 110CM (CATHETERS) IMPLANT
CATH INFINITI 5FR ANG PIGTAIL (CATHETERS) IMPLANT
CATH INFINITI AMBI 6FR TG (CATHETERS) IMPLANT
DEVICE RAD COMP TR BAND LRG (VASCULAR PRODUCTS) IMPLANT
GLIDESHEATH SLEND SS 6F .021 (SHEATH) IMPLANT
PACK CARDIAC CATHETERIZATION (CUSTOM PROCEDURE TRAY) ×1 IMPLANT
SET ATX-X65L (MISCELLANEOUS) IMPLANT
SHEATH GLIDE SLENDER 4/5FR (SHEATH) IMPLANT
SHEATH PINNACLE 7F 10CM (SHEATH) IMPLANT
SHEATH PROBE COVER 6X72 (BAG) IMPLANT
WIRE EMERALD 3MM-J .035X260CM (WIRE) IMPLANT
WIRE MICRO SET SILHO 5FR 7 (SHEATH) IMPLANT

## 2023-09-07 NOTE — Progress Notes (Signed)
  Progress Note  Patient Name: [name] Date of Encounter: 09/07/2023 [facility] Cardiologist: Gaylyn Keas, MD   Interval Summary   Patient reports feeling good NPO since midnight Pending RHC/LHC later this morning   Vital Signs Vitals:   09/06/23 2043 09/07/23 0428 09/07/23 0533 09/07/23 0743  BP: (!) 143/47 (!) 153/50  (!) 153/50  Pulse: (!) 49 (!) 50  (!) 51  Resp: 18 16  14   Temp: 98.2 F (36.8 C) 98.2 F (36.8 C)  97.7 F (36.5 C)  TempSrc: Oral Oral  Oral  SpO2: 100% 96%  96%  Weight:  134.2 kg 133.7 kg   Height:        Intake/Output Summary (Last 24 hours) at 09/07/2023 0837 Last data filed at 09/07/2023 0547 Gross per 24 hour  Intake 992.84 ml  Output 750 ml  Net 242.84 ml      09/07/2023    5:33 AM 09/07/2023    4:28 AM 09/06/2023    4:23 AM  Last 3 Weights  Weight (lbs) 294 lb 12.8 oz 295 lb 13.7 oz 292 lb 1.8 oz  Weight (kg) 133.72 kg 134.2 kg 132.5 kg     Telemetry/ECG  V-paced, HR 50s - Personally Reviewed  Physical Exam  GEN: No acute distress.   Neck: difficult to assess JVD due to body habitus  Cardiac: RRR, SEM.  Respiratory: distant breath sounds bilaterally. 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 Mild to moderate aortic regurgitation Presented with shortness of breath, DOE, LE edema CXR showed cardiomegaly, vascular congestion BNP 455 Given IV Lasix  without decent urine output charted Creatinine trending down, 1.92 today from 2.13 Echo showed EF 60-65%, mildly dilated LV, moderate LVH, normal RV function, biatrial enlargement, normal IVC Continue strict I&O's, daily weights, daily BMPs NPO since midnight pending RHC/LHC  today  Adjust medications as needed pending RHC results   Chest pain/ Unstable angina Nonobstructive CAD Elevated  troponin Hyperlipidemia Troponin level 123 > 131 09/03/2023: ALT 24; HDL 50; LDL Cholesterol 44  Continue Crestor  10 mg daily Continue ASA 81 mg daily RHC/LHC today -- adjust medications accordingly post cath    History of DVT/PE s/p IVC filter in 2011 Currently on warfarin Atrial fibrillation High degree AV block s/p PPM D-dimer elevated at 4.03 LE doppler showed acute left DVT Currently on IV heparin  in replacement of Warfarin pending cath     For questions or updates, please contact [facility] Please consult [email or web address] for contact info under       Signed, Jiles Mote, PA-C

## 2023-09-07 NOTE — Progress Notes (Signed)
  Progress Note   Patient: [name] ZOX:[id number] DOB: 1955-05-10 DOA: 09/03/2023     4 DOS: the patient was seen and examined on 09/07/2023 at 10AM      Brief hospital course: 68 y.o. F with MO, CKD IIIb baseline 1.5, HTN, HLD, DM, COPD, dCHF, pAF on warfarin, CHB s/p PPM, hx DVT/PE with IVC, and hypothyroidism who presented with CHF flare.     Assessment and Plan: * Shortness of breath Acute CHF ruled out Initially thought to be CHF flare.  Diuresed and no change in symptoms, but Cr increased.    Went to RHC today and PCWP low.  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  pHTN couldn't explain the procal negative/WBC normal bilateral infiltrates however.   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  RPh and I see only one discussion of DOAC back in 2013 that related to renal function. Cost now quite affordable - Stop warfarin - Start apixaban   Anemia Thrombocytopenia Counts stable  History of pulmonary embolism, recurrent, 3 seperate episodes. History of IVC filter Acute DVT US  this admission purportedly shows acute DVT in left common femoral vein.  Not unexpected in this patient with recurrent VTE, IVC filter and subtherapeutic INR.    Given her history of sub and supratherapeutic INR, including new warfarin failure, we will transition to Eliquis - Start Eliquis  Type 2 diabetes mellitus with stage 3b chronic kidney disease, with long-term current use of insulin  (HCC) Glucose controlled - Continue home glargine - Continue sliding scale  corrections  Hypothyroidism - Continue levothyroxine   Obstructive sleep apnea Intolerant of CPAP  Morbid obesity with BMI of 50.0-59.9, adult (HCC) Class III, BMI 54, complicates care          Subjective: No change.       Physical Exam: BP (!) 135/54 (BP Location: Left Wrist)   Pulse (!) 47   Temp 97.8 F (36.6 C) (Oral)   Resp 17   Ht 5\' 1"  (1.549 m)   Wt 133.7 kg   SpO2 (!) 89%   BMI 55.70 kg/m   Obese adult female, lying in bed, interactive and appropriate RRR, soft systolic murmur, chronic venous stasis change of both legs, volume status difficult to assess Respiratory rate normal, lung sounds diminished but no rales or wheezes appreciated Abdomen soft no tenderness palpation or guarding Attention normal, affect sleepy, judgment and insight appear normal, generalized weakness but symmetric strength, oriented to person, place, and time  Data Reviewed: Cr slightly improved today to 1.9, electrolytes normal Tele reviewed, NSR Procal negative CBC with mild anemia and thrombocytopenia  Family Communication: None    Disposition: Status is: Inpatient         Author: Ephriam Hashimoto, MD 09/07/2023 3:05 PM  For on call review [email or web address].

## 2023-09-07 NOTE — Discharge Instructions (Signed)
Information on my medicine - ELIQUIS (apixaban)  Why was Eliquis prescribed for you? Eliquis was prescribed to treat blood clots that may have been found in the veins of your legs (deep vein thrombosis) or in your lungs (pulmonary embolism) and to reduce the risk of them occurring again.  What do You need to know about Eliquis ? The starting dose is 10 mg (two 5 mg tablets) taken TWICE daily for the FIRST SEVEN (7) DAYS, then  the dose is reduced to ONE 5 mg tablet taken TWICE daily.  Eliquis may be taken with or without food.   Try to take the dose about the same time in the morning and in the evening. If you have difficulty swallowing the tablet whole please discuss with your pharmacist how to take the medication safely.  Take Eliquis exactly as prescribed and DO NOT stop taking Eliquis without talking to the doctor who prescribed the medication.  Stopping may increase your risk of developing a new blood clot.  Refill your prescription before you run out.  After discharge, you should have regular check-up appointments with your healthcare provider that is prescribing your Eliquis.    What do you do if you miss a dose? If a dose of ELIQUIS is not taken at the scheduled time, take it as soon as possible on the same day and twice-daily administration should be resumed. The dose should not be doubled to make up for a missed dose.  Important Safety Information A possible side effect of Eliquis is bleeding. You should call your healthcare provider right away if you experience any of the following: Bleeding from an injury or your nose that does not stop. Unusual colored urine (red or dark brown) or unusual colored stools (red or black). Unusual bruising for unknown reasons. A serious fall or if you hit your head (even if there is no bleeding).  Some medicines may interact with Eliquis and might increase your risk of bleeding or clotting while on Eliquis. To help avoid this, consult  your healthcare provider or pharmacist prior to using any new prescription or non-prescription medications, including herbals, vitamins, non-steroidal anti-inflammatory drugs (NSAIDs) and supplements.  This website has more information on Eliquis (apixaban): http://www.eliquis.com/eliquis/home

## 2023-09-07 NOTE — Consult Note (Signed)
 NAME:  [name], MRN:  [id number], DOB:  [birth year], LOS: 4 ADMISSION DATE:  09/03/2023, CONSULTATION DATE:  09/07/23 REFERRING MD:  TRH, CHIEF COMPLAINT:  DOE   History of Present Illness:  68 year old woman history of OSA does not tolerate BiPAP admitted with worsening dyspnea exertion whom were consulted for evaluation of the same.  She reports onset of symptoms some months ago.  First noted worsening shortness of breath many months ago.  Gradually has worsened over time.  At first she could become short of breath with minimal exertion around her house.  But she would sit and rest and get better relatively quickly.  Over the last several months this has not occurred.  She has to rest longer or does not feel as good when she does rest.  She is, to the point where she is minimally active.  Spends she admits currently 16 hours a day in bed.  Over the last couple days things got worse to where when she rest she really would feel much better.  Eventually she would calm down and breathe better but took a long time.  She was afraid to move around much.  This prompted her to call EMS per her report.  She denies much cough.  She has a history of sleep apnea.  She does not wear NIPPV.  She states she cannot wear the mask.  She tried multiple months.  She just cannot do it.  She underwent right heart catheter that showed a mean PA pressure 37, LVEDP of 23, PVR 2.3 when calculated with LVEDP, right atrial pressure of 13, Fick cardiac output and index preserved.  Notably she was quite hypoxemic when lying flat prior to any sedation for right heart catheterization.  She had a low venous sat, central venous sat low given her low arterial sat yielded preserved cardiac output and index.  Her procalcitonin is low.  Pertinent  Medical History  OSA does not use NIPPV  Significant Hospital Events: Including procedures, antibiotic start and stop dates in addition to other pertinent events   5/29 admitted  with worsening dyspnea on exertion, no hypoxemia noted 6/2 PCCM consulted for evaluation of ongoing dyspnea on exertion, right heart catheterization demonstrates pulmonary hypertension with PVR less than 3 when calculated with LVEDP, elevated LVEDP, elevated right atrial pressure, preserved cardiac output and index  Interim History / Subjective:    Objective    Blood pressure (!) 135/54, pulse (!) 47, temperature 97.8 F (36.6 C), temperature source Oral, resp. rate 17, height 5\' 1"  (1.549 m), weight 133.7 kg, SpO2 (!) 89%.        Intake/Output Summary (Last 24 hours) at 09/07/2023 1412 Last data filed at 09/07/2023 0547 Gross per 24 hour  Intake 992.84 ml  Output 750 ml  Net 242.84 ml   Filed Weights   09/06/23 0423 09/07/23 0428 09/07/23 0533  Weight: 132.5 kg 134.2 kg 133.7 kg    Examination: General: Morbidly obese lying in bed HENT: Atraumatic normocephalic, habitus precludes accurate assessment of JVP Lungs: Diminished but clear, normal work of breathing Cardiovascular: Regular rate and rhythm, appears each beat is paced on monitor Abdomen: Nondistended Extremities: 1+ pitting edema to thigh bilaterally, chronic ruddy erythema likely chronic venous stasis noted Neuro: Moves all extremities no focal deficit noted  Resolved problem list   Assessment and Plan   Acute on chronic dyspnea on exertion, shortness of breath: Several months of preceding gradually worsening dyspnea on exertion.  Would get better with rest.  Even with minimal exertion around the house.  Now with rest it does not get better or is better quickly as prior.  This is likely most multifactorial a large degree due to deconditioning with admittedly decreased activity now spending 16 hours a day in bed.  However, the majority of this seems related to her obesity as well as development of pulmonary hypertension is confirmed a right heart catheterization.  I suspect she is now decompensated with volume overload  given evidence of elevated right atrial pressure and left atrial pressure on heart catheterization, leading to worsening acute symptoms.  Imaging and echocardiographic findings are consistent with pulmonary venous hypertension. -- Most likely thing to help would be aggressive IV diuresis until euvolemic as blood pressure and kidney function allows  Pulmonary hypertension: Multifactorial, largely driven by group 2 disease as evidenced by elevated LVEDP as well as chronic pulmonary venous hypertension with dilated left atrium, suspect diastolic dysfunction given prominent V waves described on the right heart catheterization.  Certainly an element of likely group 3 disease in setting of her OSA, untreated that she cannot tolerate NIPPV as well as OHS demonstrated by hypoxemia when laid flat for cath.  Her PVR is less than 3 when calculated with LVEDP, cardiac output and index are preserved.  Possible given her hypoxemia that right heart cath parameters, PVR and mean PA pressure are overestimated and would improve if not hypoxemic.  Given her multifactorial disease and inability to tolerate CPAP, I would not recommend pulmonary vasodilators. -- Aggressive IV diuresis per primary and cardiology  Obstructive sleep apnea: Diagnosed many years ago.  Undertreated she does not tolerate CPAP.  Is tried multiple mask.  I offered additional mask fitting another trial while admitted and she declines.  Her morbid obesity precludes consideration of inspire device. -- Recommend referral to dentistry as outpatient for oral appliance to see if this would be appropriate for her OSA given her inability to tolerate CPAP  PCCM will sign off  Best Practice (right click and "Reselect all SmartList Selections" daily)   Per primary  Labs   CBC: Recent Labs  Lab 09/03/23 0949 09/03/23 0955 09/04/23 0406 09/05/23 0439 09/06/23 0440 09/07/23 0522  WBC 6.7  --  5.9 5.3 5.7 5.7  NEUTROABS 5.6  --   --   --   --   --    HGB 11.7* 12.6 10.0* 10.2* 10.1* 10.4*  HCT 39.1 37.0 32.3* 32.7* 33.1* 34.1*  MCV 109.5*  --  107.7* 107.9* 107.8* 107.2*  PLT 122*  --  115* 113* 107* 103*    Basic Metabolic Panel: Recent Labs  Lab 09/03/23 0949 09/03/23 0955 09/03/23 1606 09/04/23 0406 09/05/23 0439 09/06/23 0440 09/07/23 0522  NA 142 143  --  140 139 137 136  K 4.2 4.2  --  4.1 4.4 4.5 4.3  CL 103  --   --  103 104 102 101  CO2 23  --   --  29 28 27 27   GLUCOSE 172*  --   --  160* 138* 126* 125*  BUN 44*  --   --  51* 54* 68* 73*  CREATININE 1.69*  --   --  1.99* 1.91* 2.13* 1.92*  CALCIUM  9.4  --   --  8.8* 8.6* 9.0 8.8*  MG  --   --  2.0  --   --   --   --    GFR: Estimated Creatinine Clearance: 36.9 mL/min (A) (by C-G formula based on SCr  of 1.92 mg/dL (H)). Recent Labs  Lab 09/04/23 0406 09/05/23 0439 09/06/23 0440 09/07/23 0522  PROCALCITON  --   --   --  0.16  WBC 5.9 5.3 5.7 5.7    Liver Function Tests: Recent Labs  Lab 09/03/23 0949 09/05/23 0439  AST 32 18  ALT 24 15  ALKPHOS 58 46  BILITOT 1.3* 0.8  PROT 6.5 5.3*  ALBUMIN  4.0 3.2*   No results for input(s): "LIPASE", "AMYLASE" in the last 168 hours. No results for input(s): "AMMONIA" in the last 168 hours.  ABG    Component Value Date/Time   PHART 7.410 03/13/2015 1356   PCO2ART 41.3 03/13/2015 1356   PO2ART 95.0 03/13/2015 1356   HCO3 27.2 09/03/2023 0955   TCO2 28 09/03/2023 0955   O2SAT 78 09/03/2023 0955     Coagulation Profile: Recent Labs  Lab 09/03/23 0949 09/04/23 0406 09/05/23 0439 09/06/23 0440 09/07/23 0522  INR 1.7* 2.0* 1.7* 1.4* 1.3*    Cardiac Enzymes: No results for input(s): "CKTOTAL", "CKMB", "CKMBINDEX", "TROPONINI" in the last 168 hours.  HbA1C: Hgb A1c MFr Bld  Date/Time Value Ref Range Status  09/04/2023 04:06 AM 5.7 (H) 4.8 - 5.6 % Final    Comment:    (NOTE) Diagnosis of Diabetes The following HbA1c ranges recommended by the American Diabetes Association (ADA) may be used as  an aid in the diagnosis of diabetes mellitus.  Hemoglobin             Suggested A1C NGSP%              Diagnosis  <5.7                   Non Diabetic  5.7-6.4                Pre-Diabetic  >6.4                   Diabetic  <7.0                   Glycemic control for                       adults with diabetes.    09/09/2021 04:35 AM 6.0 (H) 4.8 - 5.6 % Final    Comment:    (NOTE) Pre diabetes:          5.7%-6.4%  Diabetes:              >6.4%  Glycemic control for   <7.0% adults with diabetes     CBG: Recent Labs  Lab 09/06/23 1132 09/06/23 1706 09/06/23 2143 09/07/23 0748 09/07/23 1156  GLUCAP 163* 147* 140* 109* 147*    Review of Systems:   No chest pain.  No orthopnea or PND.  Comprehensive review of systems otherwise negative.  Past Medical History:  She,  has a past medical history of A-fib (HCC), Anemia, Anticoagulated on Coumadin , chronically (09/03/2011), Anxiety, Arthritis, Bleeding on Coumadin  (08/2012; 01/18/2013), CHF (congestive heart failure) (HCC), Chronic lower back pain, Depression, DVT (deep venous thrombosis) (HCC) (10 years ago), GERD (gastroesophageal reflux disease), Gout, Headache(784.0), Heart murmur, High cholesterol, History of blood transfusion (1983; 04/2012), Hypertension, Hypothyroidism, Migraines, Obstructive sleep apnea (05/03/2012), OSA (obstructive sleep apnea), PE (pulmonary thromboembolism) (HCC) (3 years ag0), Pneumonia (before 2011), Renal disorder, Shortness of breath, Swelling of hand (08/31/2014), Type II diabetes mellitus (HCC), and UTI (urinary tract infection) (08/31/2014).   Surgical History:   Past  Surgical History:  Procedure Laterality Date   CARDIAC CATHETERIZATION N/A 03/13/2015   Procedure: Right/Left Heart Cath and Coronary Angiography;  Surgeon: Knox Perl, MD;  [facility] INVAS[facility] CV LAB;  Service: Cardiovascular;  Laterality: N/A;   CATARACT EXTRACTION W/ INTRAOCULAR LENS  IMPLANT, BILATERAL Bilateral 2006-2011    CESAREAN SECTION  1983   CHOLECYSTECTOMY  ~ 2002   COLONOSCOPY N/A 01/21/2013   Procedure: COLONOSCOPY;  Surgeon: Almeda Aris, MD;  Location: [facility] ENDOSCOPY;  Service: Endoscopy;  Laterality: N/A;   EYE SURGERY Bilateral    "multiple" (01/18/2013)   PACEMAKER IMPLANT N/A 08/31/2018   Symptomatic bradycardia due to mobitz II second degree AV block, permanent afib/ atypical atrial flutter implanted by Dr Nunzio Belch   PARS PLANA REPAIR OF RETINAL DEATACHMENT Right    PARS PLANA VITRECTOMY Bilateral 2004-2006   "several" (01/18/2013)   REFRACTIVE SURGERY Bilateral    "for stigmatism" (01/18/2013)   REFRACTIVE SURGERY Left ~ 11/2012   "to puff it up cause vision got hazy" (01/18/2013)   RIGHT HEART CATH N/A 12/30/2018   Procedure: RIGHT HEART CATH;  Surgeon: Mardell Shade, MD;  Location: [facility] INVASIVE CV LAB;  Service: Cardiovascular;  Laterality: N/A;   VENA CAVA FILTER PLACEMENT  2011?     Social History:   reports that she quit smoking about 35 years ago. Her smoking use included cigarettes. She started smoking about 65 years ago. She has a 1.5 pack-year smoking history. She has never used smokeless tobacco. She reports that she does not currently use alcohol. She reports that she does not use drugs.   Family History:  Her family history includes Cancer in her maternal uncle; Cerebral aneurysm in her maternal aunt, maternal grandfather, and mother; Hypertension in her father.   Allergies Allergies  Allergen Reactions   Ms Contin [Morphine] Hives and Rash          Home Medications  Prior to Admission medications   Medication Sig Start Date End Date Taking? Authorizing Provider  allopurinol  (ZYLOPRIM ) 300 MG tablet Take 300 mg by mouth daily.   Yes [provider]  Biotin w/ Vitamins C & E (HAIR SKIN & NAILS GUMMIES PO) Take 2 each by mouth daily at 4 PM.   Yes [provider]  citalopram  (CELEXA ) 20 MG tablet Take 20 mg by mouth at bedtime.   Yes [provider]  diazepam  (VALIUM ) 5 MG tablet Take 5 mg by mouth 2 (two) times daily as needed for anxiety or muscle spasms.   Yes [provider]  diphenhydramine -acetaminophen  (TYLENOL  PM) 25-500 MG TABS tablet Take 1 tablet by mouth at bedtime.   Yes [provider]  Ferrous Sulfate  (IRON  PO) Take 1 tablet by mouth daily.   Yes [provider]  insulin  aspart (NOVOLOG ) 100 UNIT/ML injection Inject 6 Units into the skin 3 (three) times daily with meals. Patient taking differently: Inject 10 Units into the skin 3 (three) times daily with meals. 09/12/21  Yes Deforest Fast, MD  insulin  glargine (LANTUS  SOLOSTAR) 100 UNIT/ML Solostar Pen Inject 20 Units into the skin daily.   Yes [provider]  levothyroxine  (SYNTHROID ) 75 MCG tablet Take 75 mcg by mouth daily before breakfast.    Yes [provider]  losartan  (COZAAR ) 25 MG tablet Take 1 tablet (25 mg total) by mouth daily. NEEDS FOLLOW UP APPOINTMENT FOR MORE REFILLS Patient taking differently: Take 12.5 mg by mouth at bedtime. 01/16/23  Yes Milford, Arlice Bene, FNP  MELATONIN PO  Take 2 each by mouth at bedtime. Melatonin gummies   Yes [provider]  Multiple Vitamins-Minerals (CENTRUM SILVER) CHEW Chew 2 each by mouth daily.   Yes [provider]  rosuvastatin  (CRESTOR ) 10 MG tablet Take 1 tablet (10 mg total) by mouth daily. 05/09/16  Yes Michae Aden, MD  spironolactone  (ALDACTONE ) 25 MG tablet Take 12.5 mg by mouth at bedtime.   Yes [provider]  torsemide  (DEMADEX ) 20 MG tablet Take 3 tablets (60 mg total) by mouth 2 (two) times daily. Patient taking differently: Take 40 mg by mouth See admin instructions. Take 2 tablets (40mg ) by mouth twice daily, in the morning and at 1600. 09/12/21  Yes Deforest Fast, MD  warfarin (COUMADIN ) 2.5 MG tablet TAKE 7.5 MG ON M-W-F AND 5 MG TUES, THURS, SAT AND SUN AS DIRECTED Patient taking differently: Take 2.5-5 mg by  mouth See admin instructions. Take 1 or 2 tablets by mouth every day at 1600, alternating 1 tablet (2.5mg ) and 2 tablets (5mg ) every other day. 02/10/16  Yes Ona Bidding, MD     Critical care time: n/a     Guerry Leek, MD See Tilford Foley

## 2023-09-07 NOTE — TOC Initial Note (Signed)
 Transition of Care [facility]) - Initial/Assessment Note    Patient Details  Name: [name] MRN: [id number] Date of Birth: Jun 10, 1955  [facility] CM/SW Contact:    Cosimo Diones, RN Phone Number: 09/07/2023, 4:54 PM  Clinical Narrative:  Patient presented for shortness of breath. PTA patient states she is from home alone with her dog. PT/OT recommendations are for SNF- patient is declining SNF and she is only ambulating 15 ft. Patient states she has support of neighbors; however she has not reached out to them. Patient states she has DME rolling walker and grab bars in the bathroom. Patient is adamantly stating that she will return home and do home health therapy-Case Manager discussed that this is an unsafe plan unless she has additional assistance. Patient still states she wants to return home. Patient has a safe recommendation of SNF; however, she does not want to go.  Patient did not have an agency preference- referral submitted to [facility] and start of care for RN/PT/OT will begin within 24-48 hours post transition home. Patient will most likely need ambulance transport home if she is not ambulating more than the 15 ft. Plan for Palliative consult for GOC. Case Manager will continue to follow for additional needs.  Expected Discharge Plan: Home w Home Health Services Barriers to Discharge: Continued Medical Work up   Patient Goals and CMS Choice Patient states their goals for this hospitalization and ongoing recovery are:: Patient wants to return home-refusing SNF   Choice offered to / list presented to : Patient (no agency preference.)      Expected Discharge Plan and Services In-house Referral: NA Discharge Planning Services: CM Consult Post Acute Care Choice: Home Health Living arrangements for the past 2 months: Single Family Home                   DME Agency: NA       HH Arranged: RN, Disease Management, PT, OT, Refused SNF HH Agency: [facility]  Home Health Date [facility] Agency Contacted: 09/07/23 Time HH Agency Contacted: 1654 Representative spoke with at [facility] Agency: Amy  Prior Living Arrangements/Services Living arrangements for the past 2 months: Single Family Home Lives with:: Self Patient language and need for interpreter reviewed:: Yes Do you feel safe going back to the place where you live?: Yes      Need for Family Participation in Patient Care: Yes (Comment) Care giver support system in place?: No (comment) Current home services: DME (rolling walker; grab bars) Criminal Activity/Legal Involvement Pertinent to Current Situation/Hospitalization: No - Comment as needed  Activities of Daily Living   ADL Screening (condition at time of admission) Independently performs ADLs?: Yes (appropriate for developmental age) Is the patient deaf or have difficulty hearing?: No Does the patient have difficulty seeing, even when wearing glasses/contacts?: No Does the patient have difficulty concentrating, remembering, or making decisions?: No  Permission Sought/Granted Permission sought to share information with : Case Manager, Magazine features editor       Permission granted to share info w AGENCY: [facility]        Emotional Assessment Appearance:: Appears stated age Attitude/Demeanor/Rapport: Unable to Assess Affect (typically observed): Unable to Assess Orientation: : Oriented to Self, Oriented to  Time, Oriented to Place, Oriented to Situation Alcohol / Substance Use: Not Applicable Psych Involvement: No (comment)  Admission diagnosis:  Disorder of cardiac pacemaker system, initial encounter [T82.9XXA] Acute on chronic systolic (congestive) heart failure (HCC) [I50.23] Acute congestive heart failure, unspecified heart failure  type [facility]) [I50.9] Patient Active Problem List   Diagnosis Date Noted   Obesity hypoventilation syndrome (HCC) 09/07/2023   WHO group 3 pulmonary arterial hypertension (HCC) 09/07/2023   (HFpEF)  heart failure with preserved ejection fraction (HCC) 09/03/2023   Elevated troponin 09/03/2023   Subtherapeutic international normalized ratio (INR) 09/03/2023   Presence of permanent cardiac pacemaker 09/03/2023   History of DVT (deep vein thrombosis) 09/03/2023   Pain in left hip 01/07/2023   Pain in right knee 01/07/2023   Acute on chronic heart failure with preserved ejection fraction (HFpEF) (HCC) 09/10/2021   Type 2 diabetes mellitus with stage 3b chronic kidney disease, with long-term current use of insulin  (HCC) 09/09/2021   Mixed diabetic hyperlipidemia associated with type 2 diabetes mellitus (HCC) 09/09/2021   Hypothyroidism 09/09/2021   Acute cardiogenic pulmonary edema (HCC) 09/09/2021   Wound of left leg, initial encounter 09/09/2021   C6 cervical fracture (HCC) 09/01/2018   Atrial flutter (HCC) 09/01/2018   Paroxysmal atrial fibrillation (HCC) 09/01/2018   Syncope 08/29/2018   Symptomatic advanced heart block 08/29/2018   Acute on chronic diastolic CHF (congestive heart failure) (HCC)    COPD (chronic obstructive pulmonary disease) (HCC) 09/12/2015   Personal history of endocrine, metabolic or immunity disorder 16/01/9603   Decreased mobility 04/03/2015   Deep vein thrombosis (DVT) of both lower extremities (HCC) 02/24/2015   Chronic kidney disease, stage 3b (HCC) 02/24/2015   Long term current use of anticoagulant therapy 11/19/2014   Osteoarthritis 08/31/2014   Cellulitis of hand, right 08/31/2014   Tremors  05/07/2012   Obstructive sleep apnea 05/03/2012   Anticoagulated on Coumadin , chronically 09/03/2011   Thrombocytopenia, this admission, 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 08/13/2011   Presence of IVC filter, placed June 2011 after 3d PE 08/13/2011   Pulmonary HTN, Moderate  PA pressure 09/2009 08/13/2011   PCP:  Edda Goo, MD Pharmacy:   [facility] [phone] - [place], [place] - [address] [facility] [address] [place] [address] Phone: [phone] Fax: [phone]  [facility] [address] [place] [address] Phone: [phone] Fax: [phone]     Social Drivers of Health (SDOH) Social History: SDOH Screenings   Food Insecurity: No Food Insecurity (09/03/2023)  Housing: Low Risk  (09/03/2023)  Transportation Needs: No Transportation Needs (09/03/2023)  Utilities: Not At Risk (09/03/2023)  Social Connections: Socially Isolated (09/03/2023)  Tobacco Use: Medium Risk (09/03/2023)   SDOH Interventions:     Readmission Risk Interventions     No data to display

## 2023-09-07 NOTE — Progress Notes (Signed)
 ANTICOAGULATION CONSULT NOTE Pharmacy Consult for Heparin  Indication: atrial fibrillation and DVT   Allergies  Allergen Reactions   Ms Contin [Morphine] Hives and Rash         Patient Measurements: Height: 5\' 1"  (154.9 cm) Weight: 133.7 kg (294 lb 12.8 oz) IBW/kg (Calculated) : 47.8  Vital Signs: Temp: 97.7 F (36.5 C) (06/02 0743) Temp Source: Oral (06/02 0743) BP: 140/56 (06/02 1049) Pulse Rate: 50 (06/02 1051)  Labs: Recent Labs    09/05/23 0439 09/05/23 1614 09/06/23 0440 09/07/23 0522  HGB 10.2*  --  10.1* 10.4*  HCT 32.7*  --  33.1* 34.1*  PLT 113*  --  107* 103*  LABPROT 19.8*  --  17.4* 16.4*  INR 1.7*  --  1.4* 1.3*  HEPARINUNFRC 0.28* 0.36 0.39 0.37  CREATININE 1.91*  --  2.13* 1.92*    Estimated Creatinine Clearance: 36.9 mL/min (A) (by C-G formula based on SCr of 1.92 mg/dL (H)).  Assessment: 68 yo female with acute DVT and a history DVT/PE and afib on warfarin PTA. She is now s/p cath  -heparin  to restart 2 hours post TR band   Goal of Therapy:  Heparin  level 0.3-0.7 units/mL INR Goal 2-3 Monitor platelets by anticoagulation protocol: Yes   Plan:  Restart heparin  infusion 1300 units/hr 2 hrs post TR band Heparin  level in 8 hrs Eventual transition back to [facility] versus transition to [facility]  Baxter Limber, PharmD Clinical Pharmacist **Pharmacist phone directory can now be found on [email or web address] ([email or web address]).  Listed under [facility].

## 2023-09-07 NOTE — Evaluation (Signed)
 Physical Therapy Evaluation Patient Details Name: [name] MRN: [id number] DOB: 1955/06/29 Today's Date: 09/07/2023  History of Present Illness  68 y.o. female presents 5/29 with worsening shortness of breath and leg swelling. In ED noted to have HR 46-52, and increasing troponins Chest x-ray revealed cardiomegaly with vascular congestion and edema. Admitted for treatment of Acute on chronic HFpEF s/p 6/2 Heart cath PMH: hypertension, atrial fibrillation, heart block s/p PPM, diastolic congestive heart failure, diabetes mellitus type 2 CKD stage IIIb, DVT/PE x 3, s/p IVC filter on Coumadin , hypothyroidism, and obstructive sleep apnea  Clinical Impression  PTA pt living alone with her Husky "Hunter". Pt reports ambulation mainly at home with RW or Rollator. Pt has groceries, medication and other supplies delivered to house. Pt report she is able to perform her own ADLs and iADLs. Pt is currently limited in safe mobility by increased O2 demand, in presence of decreased strength, balance and endurance. Pt is min A for bed mobility, min A for transfers and min A for ambulation of 10-15 feet, requiring close chair follow. Pt requires 2L O2 via [place] to maintain SpO2 >90%O2 with ambulation. Patient will benefit from continued inpatient follow up therapy, <3 hours/day, however she adamantly refuses. PT also recommended PTAR home due to 3 steps to enter and decreased mobility tolerance. Pt refuses that also.        If plan is discharge home, recommend the following: Two people to help with walking and/or transfers;A lot of help with bathing/dressing/bathroom;Assistance with cooking/housework;Assist for transportation;Help with stairs or ramp for entrance   Can travel by private vehicle   No    Equipment Recommendations None recommended by PT     Functional Status Assessment Patient has had a recent decline in their functional status and demonstrates the ability to make significant improvements in  function in a reasonable and predictable amount of time.     Precautions / Restrictions Precautions Precautions: Fall Restrictions Weight Bearing Restrictions Per Provider Order: No      Mobility  Bed Mobility Overal bed mobility: Needs Assistance Bed Mobility: Supine to Sit     Supine to sit: HOB elevated, Min assist     General bed mobility comments: with decreased R UE pt is able to manage LE off side of bed and reach across to bedrail with her LUE to pull to EoB    Transfers Overall transfer level: Needs assistance   Transfers: Sit to/from Stand Sit to Stand: Min assist           General transfer comment: good power up, requires min A for steadying in standing    Ambulation/Gait Ambulation/Gait assistance: +2 safety/equipment, Min assist (with close chair follow) Gait Distance (Feet): 15 Feet (x2) Assistive device: Rolling walker (2 wheels) Genene Kennel) Gait Pattern/deviations: Step-through pattern, Shuffle, Antalgic, Trunk flexed Gait velocity: slowed Gait velocity interpretation: <1.31 ft/sec, indicative of household ambulator   General Gait Details: contact guard for safety, increasingly antalgic gait due to knee OA, pt ambulates with flexed forward posture and increased lateral sway        Balance Overall balance assessment: Needs assistance Sitting-balance support: Feet supported, Single extremity supported, Bilateral upper extremity supported Sitting balance-[name]: Poor     Standing balance support: During functional activity, Reliant on assistive device for balance, Bilateral upper extremity supported, Single extremity supported Standing balance-[name]: Poor  Pertinent Vitals/Pain Pain Assessment Pain Assessment: Faces Pain Location: bilateral knees with ambulation Pain Descriptors / Indicators: Grimacing, Guarding, Moaning, Sore    Home Living Family/patient expects to be discharged to::  Private residence Living Arrangements: Alone Available Help at Discharge: Neighbor;Available PRN/intermittently Type of Home: House Home Access: Stairs to enter Entrance Stairs-Rails: Left Entrance Stairs-Number of Steps: 3   Home Layout: One level Home Equipment: Shower seat;Hand held shower head;Grab bars - tub/shower;Rollator (4 wheels);Rolling Walker (2 wheels)      Prior Function Prior Level of Function : Independent/Modified Independent             Mobility Comments: use RW for ambulation in house, rarely goes out ADLs Comments: mod I for ADLs, groceries delivered, uses [employer]     Extremity/Trunk Assessment   Upper Extremity Assessment Upper Extremity Assessment: RUE deficits/detail RUE Deficits / Details: heart cath this morning    Lower Extremity Assessment Lower Extremity Assessment: Generalized weakness;RLE deficits/detail;LLE deficits/detail RLE Deficits / Details: decreased muscle mass in calfs, OA pain with standing and ambulation RLE Sensation: decreased light touch LLE Deficits / Details: decreased muscle mass in calfs, OA pain with standing and ambulation    Cervical / Trunk Assessment Cervical / Trunk Assessment: Kyphotic  Communication   Communication Communication: No apparent difficulties    Cognition Arousal: Alert Behavior During Therapy: WFL for tasks assessed/performed   PT - Cognitive impairments: Safety/Judgement                       PT - Cognition Comments: pt has inflated assessment of her safety with ambulation Following commands: Intact       Cueing Cueing Techniques: Tactile cues, Visual cues, Gestural cues     General Comments General comments (skin integrity, edema, etc.): SpO2 on RA 93%O2 in supine, with ambulation on RA SpO2 dropped to 87%O2, pt able to ambulate on 2L O2 via [place], and maintain SpO2 >90%O2, in supine BP 140/55 HR 50, pt with lightheadedness in standing and sits back down BP 171/58 HR 52 bpm, pt stands  again without lightheadedness BP 174/55, HR 52, seated after ambulation BP 172/52 HR 51bpm        Assessment/Plan    PT Assessment Patient needs continued PT services  PT Problem List Decreased strength;Decreased range of motion;Decreased activity tolerance;Decreased balance;Decreased mobility;Decreased cognition;Decreased safety awareness;Cardiopulmonary status limiting activity;Impaired sensation       PT Treatment Interventions DME instruction;Gait training;Stair training;Functional mobility training;Therapeutic activities;Therapeutic exercise;Balance training;Cognitive remediation;Patient/family education    PT Goals (Current goals can be found in the Care Plan section)  Acute Rehab PT Goals Patient Stated Goal: get home to her Husky "Hunter" PT Goal Formulation: With patient Time For Goal Achievement: 09/21/23 Potential to Achieve Goals: Fair    Frequency Min 2X/week        AM-PAC PT "6 Clicks" Mobility  Outcome Measure Help needed turning from your back to your side while in a flat bed without using bedrails?: None Help needed moving from lying on your back to sitting on the side of a flat bed without using bedrails?: A Little Help needed moving to and from a bed to a chair (including a wheelchair)?: A Little Help needed standing up from a chair using your arms (e.g., wheelchair or bedside chair)?: A Little Help needed to walk in hospital room?: A Lot Help needed climbing 3-5 steps with a railing? : Total 6 Click Score: 16    End of Session Equipment Utilized During Treatment:  Gait belt;Oxygen Activity Tolerance: Patient limited by pain;Treatment limited secondary to medical complications (Comment) (SoB and increased O2 demand) Patient left: in chair;with call bell/phone within reach;with chair alarm set Nurse Communication: Mobility status PT Visit Diagnosis: Unsteadiness on feet (R26.81);Muscle weakness (generalized) (M62.81);Difficulty in walking, not elsewhere  classified (R26.2);History of falling (Z91.81);Pain Pain - Right/Left:  (bilateral) Pain - part of body: Knee    Time: 1429-1510 PT Time Calculation (min) (ACUTE ONLY): 41 min   Charges:   PT Evaluation $PT Eval Moderate Complexity: 1 Mod PT Treatments $Therapeutic Activity: 8-22 mins PT General Charges $$ ACUTE PT VISIT: 1 Visit         Merrill Villarruel B. Jewel Mortimer PT, DPT Acute Rehabilitation Services Please use secure chat or  Call Office [phone]   Verlie Glisson Fleet 09/07/2023, 4:00 PM

## 2023-09-07 NOTE — Interval H&P Note (Signed)
 History and Physical Interval Note:  09/07/2023 6:56 AM  [name]  has presented today for surgery, with the diagnosis of chest pain, heart failure.  The various methods of treatment have been discussed with the patient and family. After consideration of risks, benefits and other options for treatment, the patient has consented to  Procedure(s): RIGHT/LEFT HEART CATH AND CORONARY ANGIOGRAPHY (N/A) as a surgical intervention.  The patient's history has been reviewed, patient examined, no change in status, stable for surgery.  I have reviewed the patient's chart and labs.  Questions were answered to the patient's satisfaction.     Burnis Halling K Layman Gully

## 2023-09-08 ENCOUNTER — Other Ambulatory Visit (HOSPITAL_COMMUNITY): Payer: Self-pay

## 2023-09-08 DIAGNOSIS — K5900 Constipation, unspecified: Secondary | ICD-10-CM

## 2023-09-08 DIAGNOSIS — Z789 Other specified health status: Secondary | ICD-10-CM

## 2023-09-08 DIAGNOSIS — Z515 Encounter for palliative care: Secondary | ICD-10-CM

## 2023-09-08 DIAGNOSIS — Z66 Do not resuscitate: Secondary | ICD-10-CM

## 2023-09-08 DIAGNOSIS — Z7189 Other specified counseling: Secondary | ICD-10-CM

## 2023-09-08 DIAGNOSIS — I509 Heart failure, unspecified: Secondary | ICD-10-CM

## 2023-09-08 DIAGNOSIS — Z7409 Other reduced mobility: Secondary | ICD-10-CM

## 2023-09-08 DIAGNOSIS — I5033 Acute on chronic diastolic (congestive) heart failure: Secondary | ICD-10-CM | POA: Diagnosis not present

## 2023-09-08 LAB — BASIC METABOLIC PANEL WITH GFR
Anion gap: 6 (ref 5–15)
BUN: 76 mg/dL — ABNORMAL HIGH (ref 8–23)
CO2: 28 mmol/L (ref 22–32)
Calcium: 9 mg/dL (ref 8.9–10.3)
Chloride: 103 mmol/L (ref 98–111)
Creatinine, Ser: 2.2 mg/dL — ABNORMAL HIGH (ref 0.44–1.00)
GFR, Estimated: 24 mL/min — ABNORMAL LOW (ref >60)
Glucose, Bld: 127 mg/dL — ABNORMAL HIGH (ref 70–99)
Potassium: 4.6 mmol/L (ref 3.5–5.1)
Sodium: 137 mmol/L (ref 135–145)

## 2023-09-08 LAB — CBC
HCT: 33.3 % — ABNORMAL LOW (ref 36.0–46.0)
Hemoglobin: 10.3 g/dL — ABNORMAL LOW (ref 12.0–15.0)
MCH: 33.6 pg (ref 26.0–34.0)
MCHC: 30.9 g/dL (ref 30.0–36.0)
MCV: 108.5 fL — ABNORMAL HIGH (ref 80.0–100.0)
Platelets: 91 10*3/uL — ABNORMAL LOW (ref 150–400)
RBC: 3.07 MIL/uL — ABNORMAL LOW (ref 3.87–5.11)
RDW: 14.6 % (ref 11.5–15.5)
WBC: 5.6 10*3/uL (ref 4.0–10.5)
nRBC: 0 % (ref 0.0–0.2)

## 2023-09-08 LAB — GLUCOSE, CAPILLARY
Glucose-Capillary: 185 mg/dL — ABNORMAL HIGH (ref 70–99)
Glucose-Capillary: 112 mg/dL — ABNORMAL HIGH (ref 70–99)

## 2023-09-08 MED ORDER — AMLODIPINE BESYLATE 5 MG PO TABS
5.0000 mg | ORAL_TABLET | Freq: Every day | ORAL | 0 refills | Status: DC
  Filled 2023-09-08: qty 90, 90d supply, fill #0

## 2023-09-08 MED ORDER — SENNOSIDES-DOCUSATE SODIUM 8.6-50 MG PO TABS: 2.0000 | ORAL_TABLET | Freq: Two times a day (BID) | ORAL | Status: DC | PRN

## 2023-09-08 MED ORDER — APIXABAN (ELIQUIS) VTE STARTER PACK (10MG AND 5MG)
ORAL_TABLET | ORAL | 0 refills | Status: DC
  Filled 2023-09-08: qty 74, 30d supply, fill #0

## 2023-09-08 MED ORDER — TORSEMIDE 20 MG PO TABS
40.0000 mg | ORAL_TABLET | Freq: Two times a day (BID) | ORAL | 0 refills | Status: DC
  Filled 2023-09-08: qty 180, 45d supply, fill #0

## 2023-09-08 NOTE — Consult Note (Signed)
 Consultation Note Date: 09/08/2023   Patient Name: [name]  DOB: 05/31/1955  MRN: [id number]  Age / Sex: 68 y.o., female  PCP: Edda Goo, MD Referring Physician: Ephriam Hashimoto, *  Reason for Consultation: Establishing goals of care  HPI/Patient Profile: 68 y.o. female  with past medical history of MO, CKD IIIb baseline 1.5, HTN, HLD, DM, COPD, dCHF, pAF on warfarin, CHB s/p PPM, hx DVT/PE with IVC, obesity hypoventilation syndrome and hypothyroidism. admitted on 09/03/2023 with shortness of breath initially thought to be COPD exacerbation.  Diuresed without change in symptoms with a bump in creatinine.  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  PMT has been consulted to assist with goals of care conversation.  Clinical Assessment and Goals of Care:  I have reviewed medical records including EPIC notes, labs and imaging, spoke with admitting physician Dr. Darlyn Eke via phone regarding patient's status. assessed the patient to discuss diagnosis prognosis, GOC, disposition and options.  I introduced Palliative Medicine as specialized medical care for people living with serious illness. It focuses on providing relief from the symptoms and stress of a serious illness. The goal is to improve quality of life for both the patient and the family.  We discussed a brief life review of the patient and then focused on their current illness.  Discussed that we are worried that patient's current level of symptoms and debility is now her new baseline.  Unfortunately, there are limited options to treat her current medical problems and while we hope she will improve, it is likely her condition will continue to decline.  I attempted to elicit values and goals of care important to the patient.    Medical History Review and Family/Patient  Understanding:   She states she was admitted due to having too much fluid on her heart.  States she feels better now after receiving diuresis although continues to experience some shortness of breath.  Social History: ***  Functional and Nutritional State: ***  Palliative Symptoms: ***  Advance Directives: A detailed discussion regarding advanced directives was had. ***   Code Status: ***  Discussion: ***  The difference between aggressive medical intervention and comfort care was considered in light of the patient's goals of care. Hospice and Palliative Care services outpatient were explained and offered.   Discussed the importance of continued conversation with family and the medical providers regarding overall plan of care and treatment options, ensuring decisions are within the context of the patient's values and GOCs.   Questions and concerns were addressed.  Hard Choices booklet left for review. The family was encouraged to call with questions or concerns.  PMT will continue to support holistically.   {Primary Decision NWGNF:62130}    SUMMARY OF RECOMMENDATIONS   *** Code Status/Advance Care Planning: {Palliative Code status:23503}   Symptom Management:  ***  Palliative Prophylaxis:  {Palliative Prophylaxis:21015}  Additional Recommendations (Limitations, Scope, Preferences): {Recommended Scope and Preferences:21019}  Psycho-social/Spiritual:  Desire for further Chaplaincy support:{YES NO:22349} Additional Recommendations: {PAL SOCIAL:21064}  Prognosis:  {Palliative Care Prognosis:23504}  Discharge Planning: {Palliative dispostion:23505}      Primary Diagnoses: Present on Admission:  Elevated troponin  Paroxysmal atrial fibrillation (HCC)  Essential hypertension  Hypothyroidism  Mixed diabetic hyperlipidemia associated with  type 2 diabetes mellitus (HCC)  Obstructive sleep apnea  Subtherapeutic international normalized ratio (INR)  Presence of  permanent cardiac pacemaker  History of pulmonary embolism, recurrent, 3 seperate episodes.  COPD (chronic obstructive pulmonary disease) (HCC)  Chronic kidney disease, stage 3b (HCC)  Acute on chronic heart failure with preserved ejection fraction (HFpEF) (HCC)  Anemia    Physical Exam  Vital Signs: BP (!) 173/81 (BP Location: Left Arm)   Pulse (!) 50   Temp 98.4 F (36.9 C) (Oral)   Resp 17   Ht 5\' 1"  (1.549 m)   Wt 135.1 kg   SpO2 95%   BMI 56.27 kg/m  Pain Scale: 0-10 POSS *See Group Information*: 1-Acceptable,Awake and alert Pain Score: 0-No pain   SpO2: SpO2: 95 % O2 Device:SpO2: 95 % O2 Flow Rate: .O2 Flow Rate (L/min): 1.5 L/min   Palliative Assessment/Data:    Total time: I spent *** minutes in the care of the patient today in the above activities and documenting the encounter.  Billing based on MDM: ***  {Problems Addressed:304933}  {Amount and/or Complexity of MVHQ:469629}  {Risks:304936}   Render Carrie, NP  Palliative Medicine Team Team phone # [phone]  Thank you for allowing the Palliative Medicine Team to assist in the care of this patient. Please utilize secure chat with additional questions, if there is no response within 30 minutes please call the above phone number.  Palliative Medicine Team providers are available by phone from 7am to 7pm daily and can be reached through the team cell phone.  Should this patient require assistance outside of these hours, please call the patient's attending physician.

## 2023-09-08 NOTE — Progress Notes (Signed)
  Progress Note  Patient Name: [name] Date of Encounter: 09/08/2023 [facility] Cardiologist: Gaylyn Keas, MD   Interval Summary   Patient reports feeling better today Breathing better Was able to walk to the bathroom without difficulties Reports that she feels like she has been peeing more without Lasix    Vital Signs Vitals:   09/08/23 0100 09/08/23 0441 09/08/23 0548 09/08/23 0754  BP: (!) 150/47 (!) 179/68 (!) 151/41 128/72  Pulse:      Resp: 15     Temp: (!) 97.5 F (36.4 C) 97.9 F (36.6 C)  97.8 F (36.6 C)  TempSrc: Oral Oral  Oral  SpO2:      Weight:  135.1 kg    Height:        Intake/Output Summary (Last 24 hours) at 09/08/2023 0759 Last data filed at 09/08/2023 0451 Gross per 24 hour  Intake 429.64 ml  Output 400 ml  Net 29.64 ml      09/08/2023    4:41 AM 09/07/2023    5:33 AM 09/07/2023    4:28 AM  Last 3 Weights  Weight (lbs) 297 lb 12.8 oz 294 lb 12.8 oz 295 lb 13.7 oz  Weight (kg) 135.081 kg 133.72 kg 134.2 kg     Telemetry/ECG  V-paced, HR 50s - Personally Reviewed  Physical Exam  GEN: No acute distress.   Neck: No JVD Cardiac: RRR, no murmurs, rubs, or gallops.  Respiratory: Clear to auscultation bilaterally. 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  Continue strict I&O's, daily weights, daily BMPs Seen by pulmonology who recommended aggressive IV diuresis and dentist referral to see if oral appliance is appropriate given CPAP intolerance  Due to her poor response to IV Lasix  previously, she states she believes she's been peeing more without Lasix  and creatinine of 2.20 today will discuss with MD Previously on torsemide  40 mg BID PTA   Chest pain/ Unstable angina Nonobstructive CAD Elevated troponin Hyperlipidemia Troponin level 123 > 131 09/03/2023: ALT 24; HDL 50; LDL Cholesterol 44  Cath results as above  Elevated troponin likely 2/2 to demand ischemia  Continue Crestor  10 mg daily Continue ASA 81 mg daily   History of DVT/PE s/p IVC filter in 2011 Currently on warfarin Atrial fibrillation High degree AV block s/p PPM D-dimer elevated at 4.03 LE doppler showed acute left DVT Started on Eliquis, was previously on Warfarin, thought to be due to cost -- affordable for patient now     For questions or updates, please contact [facility] Please consult [email or web address] for contact info under       Signed, Jiles Mote, PA-C

## 2023-09-08 NOTE — Progress Notes (Signed)
 Discharge completed by Tonya,F,LPN. Pt awaiting transport to go to discharge lounge. Team aware that patients oxygen needs to be delivered to discharge lounge. Pt stable, OOB in chair. Denies any questions or pain. Pt verbalized understanding of discharge plan.

## 2023-09-08 NOTE — Plan of Care (Signed)
  Problem: Education: Goal: Knowledge of General Education information will improve Description: Including pain rating scale, medication(s)/side effects and non-pharmacologic comfort measures Outcome: Progressing   Problem: Activity: Goal: Risk for activity intolerance will decrease Outcome: Progressing   Problem: Nutrition: Goal: Adequate nutrition will be maintained Outcome: Progressing   Problem: Coping: Goal: Level of anxiety will decrease Outcome: Progressing   Problem: Pain Managment: Goal: General experience of comfort will improve and/or be controlled Outcome: Progressing   Problem: Cardiovascular: Goal: Vascular access site(s) Level 0-1 will be maintained Outcome: Progressing

## 2023-09-08 NOTE — TOC Transition Note (Addendum)
 Transition of Care [facility]) - Discharge Note   Patient Details  Name: [name] MRN: [id number] Date of Birth: 01-08-1956  [facility] CM/SW Contact:  Cosimo Diones, RN Phone Number: 09/08/2023, 1:02 PM   Clinical Narrative: [facility] will provide home health services. Patient in need of oxygen- no preference for agency. Referral submitted to [employer] and a portable tank to be delivered to the room and concentrator to the home. Outpatient palliative referral submitted to [facility] and they will call the patient with a visit time. Patient states she will have a ride home. Patient is aware that she will need to follow up with her PCP for an order for DME electric wheelchair. No further needs identified at this time.   1334 09-08-23 Case Manager alerted [employer] to deliver oxygen to the discharge lounge.   Final next level of care: Home w Home Health Services Barriers to Discharge: No Barriers Identified   Patient Goals and CMS Choice Patient states their goals for this hospitalization and ongoing recovery are:: Patient wants to return home-refusing SNF   Choice offered to / list presented to : Patient (no agency preference.)    Discharge Plan and Services Additional resources added to the After Visit Summary for   In-house Referral: Hospice / Palliative Care Discharge Planning Services: CM Consult Post Acute Care Choice: Durable Medical Equipment          DME Arranged: Oxygen DME Agency: NA Date DME Agency Contacted: 09/08/23 Time DME Agency Contacted: 1302 Representative spoke with at DME Agency: Zula Hitch HH Arranged: RN, Disease Management, PT, OT, Refused SNF HH Agency: [facility] Date [facility] Agency Contacted: 09/07/23 Time HH Agency Contacted: 1654 Representative spoke with at [name] Agency: Amy  Social Drivers of Health (SDOH) Interventions SDOH Screenings   Food Insecurity: No Food Insecurity (09/03/2023)  Housing: Low  Risk  (09/03/2023)  Transportation Needs: No Transportation Needs (09/03/2023)  Utilities: Not At Risk (09/03/2023)  Social Connections: Socially Isolated (09/03/2023)  Tobacco Use: Medium Risk (09/03/2023)     Readmission Risk Interventions     No data to display

## 2023-09-08 NOTE — Discharge Summary (Signed)
 Physician Discharge Summary   Patient: [name] MRN: [id number] DOB: 08/05/1955  Admit date:     09/03/2023  Discharge date: 09/08/23  Discharge Physician: Ephriam Hashimoto   PCP: Edda Goo, MD     Recommendations at discharge:  Follow up with PCP Dr. Cricket Doll in 1 week for PHTN, OSA, OHS, and CHF Follow up with Cardiology in 1 week  Dayna Dunn: Please obtain BMP in 1 week (discharge Cr 2.2, K 4.6) Adjust spironolactone , torsemide  as appropriate  Referral to outpatient Pulmonology sent for pHTN, OHS/OSA Referral to outpatient Palliative Care sent     Discharge Diagnoses: Principal Problem:   End stage pulmonary hypertension   Active Problems:   Acute on chronic heart failure with preserved ejection fraction (HFpEF)    Chronic kidney disease, stage 3b (HCC)   Paroxysmal atrial fibrillation (HCC)   Subtherapeutic international normalized ratio (INR)   Permanent cardiac pacemaker   Essential hypertension   Anemia   History of pulmonary embolism, recurrent, 3 seperate episodes.   History of DVT (deep vein thrombosis)   COPD (chronic obstructive pulmonary disease) (HCC)   Type 2 diabetes mellitus with stage 3b chronic kidney disease, with long-term current use of insulin  (HCC)   Hypothyroidism   Mixed diabetic hyperlipidemia associated with type 2 diabetes mellitus (HCC)   Obstructive sleep apnea   Morbid obesity with BMI of 50.0-59.9, adult (HCC)   Obesity hypoventilation syndrome (HCC)   WHO group 3 pulmonary arterial hypertension [facility])      Hospital Course: 68 y.o. F with MO, CKD IIIb baseline 1.5, HTN, HLD, DM, COPD, dCHF, pAF on warfarin, CHB s/p PPM, hx DVT/PE with IVC, and hypothyroidism who presented with progressive shortness of breath.    *Pulmonary hypertension, group 3 Obesity hypoventilation syndrome Untreated sleep apnea Acute on chronic diastolic congestive heart failure Presented with slowly progressive shortness of breath with  exertion, leg swelling, needing to use CPAP because of shortness of breath with exertion.  CT chest showed mild ground glass opacities.  Started on IV diuresis without improvement, Cr increased.  Cardiology and pulmonology consulted, underwent RHC that showed normal PCWP.  There was lack of consensus among cardiopulmonary specialists, but given RHC findings and effects of diuresis earlier in hospitalization, in my medical opinion, aggressive diuresis may cause more harm than potential benefit, and we recommended home O2, palliative care referral, resumption of home torsemide , compliance with CPAP, and outpatient follow up.  If this fails and she were to have a precipitous decline or rapid readmission, aggressive diuresis should be considered.  Of note, at the time of medical stability, patient was recommended to discharge to SNF and was provided with safe discharge options.  She refused and elected to discharge home.    Myocardial injury due to CHF Coronary artery disease Hypertension Hyperlipidemia     Paroxysmal atrial fibrillation (HCC) Chronic heart block Subtherapeutic INR Transitioned to Eliquis  Anemia Thrombocytopenia Pancytopenia ruled out Counts stable, no clinical bleeding observed or reported  History of pulmonary embolism, recurrent, 3 seperate episodes. History of IVC filter Acute DVT US  this admission purportedly shows acute DVT in left common femoral vein.  Not unexpected in this patient with recurrent VTE, IVC filter and subtherapeutic INR.  Transitioned to Eliquis from warfarin.  Type 2 diabetes mellitus with stage 3b chronic kidney disease, with long-term current use of insulin  (HCC) Glucose controlled  Hypothyroidism On LT4, TSH normal  Obstructive sleep apnea Intolerant of CPAP due to old neck injury  Morbid obesity  with BMI of 50.0-59.9, adult (HCC) Class III, BMI 54, complicates care            The [place]  Controlled Substances  Registry was reviewed for this patient prior to discharge.   Consultants: Cardiology Pulmonology  Procedures performed: Echo Bilateral LE US  Right and left heart cath   Disposition: Home health  Diet recommendation:  Discharge Diet Orders (From admission, onward)     Start     Ordered   09/08/23 0000  Diet - low sodium heart healthy        09/08/23 1245             DISCHARGE MEDICATION: Allergies as of 09/08/2023       Reactions   Ms Contin [morphine] Hives, Rash           Medication List     PAUSE taking these medications    spironolactone  25 MG tablet Wait to take this until your doctor or other care provider tells you to start again. Commonly known as: ALDACTONE  Take 12.5 mg by mouth at bedtime.       STOP taking these medications    losartan  25 MG tablet Commonly known as: COZAAR    warfarin 2.5 MG tablet Commonly known as: COUMADIN        TAKE these medications    allopurinol  300 MG tablet Commonly known as: ZYLOPRIM  Take 300 mg by mouth daily.   amLODipine  5 MG tablet Commonly known as: NORVASC  Take 1 tablet (5 mg total) by mouth daily.   Centrum Silver Chew Chew 2 each by mouth daily.   citalopram  20 MG tablet Commonly known as: CELEXA  Take 20 mg by mouth at bedtime.   diazepam  5 MG tablet Commonly known as: VALIUM  Take 5 mg by mouth 2 (two) times daily as needed for anxiety or muscle spasms.   diphenhydramine -acetaminophen  25-500 MG Tabs tablet Commonly known as: TYLENOL  PM Take 1 tablet by mouth at bedtime.   Eliquis DVT/PE Starter Pack Generic drug: Apixaban Starter Pack (10mg  and 5mg ) Take as directed on package: start with two-5mg  tablets twice daily for 7 days. On day 8, switch to one-5mg  tablet twice daily.   HAIR SKIN & NAILS GUMMIES PO Take 2 each by mouth daily at 4 PM.   insulin  aspart 100 UNIT/ML injection Commonly known as: novoLOG  Inject 6 Units into the skin 3 (three) times daily with meals. What changed:  how much to take   IRON  PO Take 1 tablet by mouth daily.   Lantus  SoloStar 100 UNIT/ML Solostar Pen Generic drug: insulin  glargine Inject 20 Units into the skin daily.   levothyroxine  75 MCG tablet Commonly known as: SYNTHROID  Take 75 mcg by mouth daily before breakfast.   MELATONIN PO Take 2 each by mouth at bedtime. Melatonin gummies   rosuvastatin  10 MG tablet Commonly known as: CRESTOR  Take 1 tablet (10 mg total) by mouth daily.   torsemide  20 MG tablet Commonly known as: DEMADEX  Take 2 tablets (40mg ) by mouth twice daily, in the morning and at 4:00pm What changed: how much to take               Durable Medical Equipment  (From admission, onward)           Start     Ordered   09/08/23 1246  DME Oxygen  (Discharge Planning)  Once       Question Answer Comment  Length of Need Lifetime   Mode or (Route) Nasal cannula  Liters per Minute 2   Frequency Continuous (stationary and portable oxygen unit needed)   Oxygen delivery system Gas      09/08/23 1245            Follow-up Information     [facility] Follow up.   Why: Registered Nurse, Physical and Occupational Therapy-office to call with visit times. Contact information: [address] [place] [address] [phone]         Edda Goo, MD Follow up.   Specialty: Internal Medicine Contact information: [address] [place] [address] [phone]         Alexandria Angel, PA-C Follow up.   Specialties: Cardiology, Radiology Contact information: [address] [place] [address] [phone]         [facility] Follow up.   Why: Oxygen- to be delivered to the room Contact information: Address: [address], [place], [place] [address] Phone: [phone]        [facility] Follow up.   Why: Palliative Services-office to call with visit times. Contact information: [address] Lavone Power  [place]  [phone]                Discharge Instructions     Diet - low sodium heart healthy   Complete by: As directed    Discharge instructions   Complete by: As directed    **IMPORTANT DISCHARGE INSTRUCTIONS**   From Dr. Darlyn Eke: You were admitted for shortness of breath  Here, you underwent an echocardiogram and a right heart catheterization and a CT scan that showed this shortness of breath appears to be from progressive heart and lung disease.  You were evaluated by Cardiology and Pulmonology and it is our medical opinion that nothing can safely be done to improve your heart and lung function in the hospital.  We recommend resuming your home torsemide  40 mg twice daily  Use your CPAP at night  YOu have an appointment with Cardiology [facility] next week.  They will check labs at that appointment and adjust your medicines  STOP losartan  and replace it with amlodipine   STOP warfarin and replace it with apixaban  For the first week, take apixaban/Eliquis 10 mg (two tabs) twice daily After one week, reduce to once daily Take this starting tonight  Resume your other medicines tomorrow morning  Go see your primary doctor in 1-2 weeks, as you will need refills of these medications.   Increase activity slowly   Complete by: As directed    No wound care   Complete by: As directed    Pulmonary Visit   Complete by: As directed    Reason for referral: Pulmonary Hypertension       Discharge Exam: Filed Weights   09/07/23 0428 09/07/23 0533 09/08/23 0441  Weight: 134.2 kg 133.7 kg 135.1 kg    General: Pt is alert, awake, not in acute distress, sitting up in chair Cardiovascular: RRR, nl S1-S2, I hear no murmurs although auscultation limited by habitus.  LE edema is chronic, no change, no pitting. Respiratory: Normal respiratory rate and rhythm.  CTAB without rales or wheezes. Abdominal: Abdomen soft and  non-tender.  No distension or HSM.   Neuro/Psych: Strength symmetric in upper and lower extremities.  Judgment and insight appear normal.   Condition at discharge: poor  The results of significant diagnostics from this hospitalization (including imaging, microbiology, ancillary  and laboratory) are listed below for reference.   Imaging Studies: CARDIAC CATHETERIZATION Result Date: 09/07/2023 1.  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 2.  No obstructive coronary artery disease. 3.  Fick cardiac output of 5.9 L/min and Fick cardiac index of 2.7 L/min/m with the following hemodynamics:  Right atrial pressure mean of 13 mmHg  Right ventricular pressure 64/2 with an end-diastolic pressure of 14 mmHg  PA pressure 62/18 with a mean of 37 mmHg  Wedge pressure mean of 11 mmHg with V waves to 22 mmHg  Pulmonary vascular resistance of 4.4 Woods units  PA pulsatility index of 3.4 4.  LVEDP of 23 mmHg Recommendation: Resting hypercapnia and hypoxia during this procedure with an arterial blood gas demonstrating a pCO2 of and a PO2 of in conjunction with elevated PVR may suggest group 3 pulmonary hypertension.    CT CHEST WO CONTRAST Result Date: 09/05/2023 CLINICAL DATA:  Dyspnea, chronic, unclear in etiology. Shortness of breath. EXAM: CT CHEST WITHOUT CONTRAST TECHNIQUE: Multidetector CT imaging of the chest was performed following the standard protocol without IV contrast. RADIATION DOSE REDUCTION: This exam was performed according to the departmental dose-optimization program which includes automated exposure control, adjustment of the mA and/or kV according to patient size and/or use of iterative reconstruction technique. COMPARISON:  09/14/2015. FINDINGS: Cardiovascular: The heart is enlarged and there is a trace pericardial effusion. A pacemaker lead is noted in the heart. There is calcification of the mitral  valve annulus. Atherosclerotic calcification of the aorta is noted without evidence of aneurysm. The pulmonary trunk is distended suggesting underlying pulmonary artery hypertension. Mediastinum/Nodes: No mediastinal or axillary lymphadenopathy. Evaluation of the hila is limited due to lack of IV contrast. The thyroid gland, trachea, and esophagus are within normal limits. Lungs/Pleura: There is a small right pleural effusion. Scattered ground-glass opacities are noted in the lungs bilaterally. Minimal atelectasis or infiltrate is present in the right lower lobe. No significant pulmonary nodule or mass is seen. Upper Abdomen: The gallbladder is surgically absent. No acute abnormality. Musculoskeletal: A pacemaker device is present in the anterior left chest. Degenerative changes are present in the thoracic spine. No acute osseous abnormality is seen. IMPRESSION: 1. Scattered ground-glass opacities in the lungs bilaterally, possible edema or infiltrate. 2. Small right pleural effusion. 3. Distended pulmonary trunk suggesting underlying pulmonary artery hypertension. 4. Scattered coronary artery calcifications. 5. Aortic atherosclerosis. Electronically Signed   By: Wyvonnia Heimlich M.D.   On: 09/05/2023 17:12   ECHOCARDIOGRAM COMPLETE Result Date: 09/04/2023    ECHOCARDIOGRAM REPORT   Patient Name:   DALLANA MAVITY Date of Exam: 09/04/2023 Medical Rec #:  [id number]        Height:       61.0 in Accession #:    [id number]       Weight:       286.8 lb Date of Birth:  November 22, 1955        BSA:          2.202 m Patient Age:    67 years         BP:           162/53 mmHg Patient Gender: F                HR:           50 bpm. Exam Location:  Inpatient Procedure: 2D Echo, Cardiac Doppler, Color Doppler and Intracardiac  Opacification Agent (Both Spectral and Color Flow Doppler were            utilized during procedure). Indications:    CHF I50.9  History:        Patient has prior history of Echocardiogram examinations,  most                 recent 09/09/2021. Risk Factors:Hypertension and Sleep Apnea.  Sonographer:    Jeralene Mom Referring Phys: [id number] ZANE ADAMS IMPRESSIONS  1. Left ventricular ejection fraction, by estimation, is 60 to 65%. The left ventricle has normal function. The left ventricle has no regional wall motion abnormalities. The left ventricular internal cavity size was mildly dilated. There is moderate concentric left ventricular hypertrophy. Left ventricular diastolic function could not be evaluated. Elevated left atrial pressure.  2. Right ventricular systolic function is normal. The right ventricular size is normal.  3. Left atrial size was moderately dilated.  4. Right atrial size was moderately dilated.  5. The mitral valve is normal in structure. No evidence of mitral valve regurgitation. No evidence of mitral stenosis. Moderate to severe mitral annular calcification.  6. The aortic valve is tricuspid. There is mild calcification of the aortic valve. There is mild thickening of the aortic valve. Aortic valve regurgitation is not visualized. Aortic valve sclerosis/calcification is present, without any evidence of aortic stenosis.  7. The inferior vena cava is normal in size with greater than 50% respiratory variability, suggesting right atrial pressure of 3 mmHg. FINDINGS  Left Ventricle: Left ventricular ejection fraction, by estimation, is 60 to 65%. The left ventricle has normal function. The left ventricle has no regional wall motion abnormalities. The left ventricular internal cavity size was mildly dilated. There is  moderate concentric left ventricular hypertrophy. Left ventricular diastolic function could not be evaluated due to mitral annular calcification (moderate or greater). Left ventricular diastolic function could not be evaluated. Elevated left atrial pressure. Right Ventricle: The right ventricular size is normal. No increase in right ventricular wall thickness. Right ventricular  systolic function is normal. Left Atrium: Left atrial size was moderately dilated. Right Atrium: Right atrial size was moderately dilated. Pericardium: There is no evidence of pericardial effusion. Mitral Valve: The mitral valve is normal in structure. Moderate to severe mitral annular calcification. No evidence of mitral valve regurgitation. No evidence of mitral valve stenosis. Tricuspid Valve: The tricuspid valve is normal in structure. Tricuspid valve regurgitation is mild . No evidence of tricuspid stenosis. Aortic Valve: The aortic valve is tricuspid. There is mild calcification of the aortic valve. There is mild thickening of the aortic valve. Aortic valve regurgitation is not visualized. Aortic valve sclerosis/calcification is present, without any evidence of aortic stenosis. Aortic valve mean gradient measures 6.5 mmHg. Aortic valve peak gradient measures 12.2 mmHg. Aortic valve area, by VTI measures 2.12 cm. Pulmonic Valve: The pulmonic valve was normal in structure. Pulmonic valve regurgitation is mild. No evidence of pulmonic stenosis. Aorta: The aortic root is normal in size and structure. Venous: The inferior vena cava is normal in size with greater than 50% respiratory variability, suggesting right atrial pressure of 3 mmHg. IAS/Shunts: No atrial level shunt detected by color flow Doppler. Additional Comments: A device lead is visualized.  LEFT VENTRICLE PLAX 2D LVIDd:         5.40 cm   Diastology LVIDs:         3.50 cm   LV e' medial:    5.43 cm/s LV PW:  1.30 cm   LV E/e' medial:  23.1 LV IVS:        1.30 cm   LV e' lateral:   6.87 cm/s LVOT diam:     2.10 cm   LV E/e' lateral: 18.3 LV SV:         92 LV SV Index:   42 LVOT Area:     3.46 cm  RIGHT VENTRICLE RV S prime:     13.30 cm/s TAPSE (M-mode): 2.1 cm LEFT ATRIUM              Index        RIGHT ATRIUM           Index LA Vol (A2C):   104.0 ml 47.24 ml/m  RA Area:     25.40 cm LA Vol (A4C):   85.4 ml  38.79 ml/m  RA Volume:   87.10 ml   39.56 ml/m LA Biplane Vol: 93.8 ml  42.61 ml/m  AORTIC VALVE                     PULMONIC VALVE AV Area (Vmax):    2.32 cm      PR End Diast Vel: 3.82 msec AV Area (Vmean):   2.18 cm AV Area (VTI):     2.12 cm AV Vmax:           174.50 cm/s AV Vmean:          119.000 cm/s AV VTI:            0.435 m AV Peak Grad:      12.2 mmHg AV Mean Grad:      6.5 mmHg LVOT Vmax:         117.00 cm/s LVOT Vmean:        74.900 cm/s LVOT VTI:          0.266 m LVOT/AV VTI ratio: 0.61  AORTA Ao Root diam: 3.00 cm Ao Asc diam:  3.50 cm MITRAL VALVE                TRICUSPID VALVE MV Area (PHT): 4.06 cm     TR Peak grad:   23.6 mmHg MV Decel Time: 187 msec     TR Vmax:        243.00 cm/s MV E velocity: 125.50 cm/s MV A velocity: 54.00 cm/s   SHUNTS MV E/A ratio:  2.32         Systemic VTI:  0.27 m                             Systemic Diam: 2.10 cm Arta Lark Electronically signed by Arta Lark Signature Date/Time: 09/04/2023/8:26:36 PM    Final    VAS US  LOWER EXTREMITY VENOUS (DVT) Result Date: 09/04/2023  Lower Venous DVT Study Patient Name:  HOWARD PATTON  Date of Exam:   09/04/2023 Medical Rec #: [id number]         Accession #:    [id number] Date of Birth: [birth year]         Patient Gender: F Patient Age:   78 years Exam Location:  [facility] Procedure:      VAS US  LOWER EXTREMITY VENOUS (DVT) Referring Phys: Laquita Plant --------------------------------------------------------------------------------  Indications: History of PE and DVTs. Other Indications: Discoloration of both lower extremities. Risk Factors: Obesity. Anticoagulation: Heparin . Limitations: Body habitus. Performing Technologist: Franky Ivanoff Sturdivant-Jones RDMS, RVT  Examination Guidelines: A complete  evaluation includes B-mode imaging, spectral Doppler, color Doppler, and power Doppler as needed of all accessible portions of each vessel. Bilateral testing is considered an integral part of a complete examination. Limited examinations for  reoccurring indications may be performed as noted. The reflux portion of the exam is performed with the patient in reverse Trendelenburg.  +---------+---------------+---------+-----------+----------+---------------+ RIGHT    CompressibilityPhasicitySpontaneityPropertiesThrombus Aging  +---------+---------------+---------+-----------+----------+---------------+ CFV      Full           Yes      Yes                                  +---------+---------------+---------+-----------+----------+---------------+ SFJ      Full                                                         +---------+---------------+---------+-----------+----------+---------------+ FV Prox  Full                                                         +---------+---------------+---------+-----------+----------+---------------+ FV Mid   Full                                                         +---------+---------------+---------+-----------+----------+---------------+ FV DistalFull                                                         +---------+---------------+---------+-----------+----------+---------------+ PFV      Full                                                         +---------+---------------+---------+-----------+----------+---------------+ POP      Full                                                         +---------+---------------+---------+-----------+----------+---------------+ PTV                                                   patent by color +---------+---------------+---------+-----------+----------+---------------+ PERO  patent by color +---------+---------------+---------+-----------+----------+---------------+   +---------+---------------+---------+-----------+----------+-------------------+ LEFT     CompressibilityPhasicitySpontaneityPropertiesThrombus Aging       +---------+---------------+---------+-----------+----------+-------------------+ CFV      None           No       No                   Acute               +---------+---------------+---------+-----------+----------+-------------------+ SFJ      Partial                                      Acute               +---------+---------------+---------+-----------+----------+-------------------+ FV Prox  Partial                                      Age Indeterminate   +---------+---------------+---------+-----------+----------+-------------------+ FV Mid   Partial                                      Age Indeterminate   +---------+---------------+---------+-----------+----------+-------------------+ FV DistalPartial                                      Age Indeterminate   +---------+---------------+---------+-----------+----------+-------------------+ PFV      Full                                                             +---------+---------------+---------+-----------+----------+-------------------+ POP      None                                         Age Indeterminate   +---------+---------------+---------+-----------+----------+-------------------+ PTV                                                   Not well visualized +---------+---------------+---------+-----------+----------+-------------------+ PERO                                                  Not well visualized +---------+---------------+---------+-----------+----------+-------------------+ Bilateral iliac veins were not visualized due to body habitus.  Summary: RIGHT: - There is no evidence of deep vein thrombosis in the lower extremity.  LEFT: - Findings consistent with acute deep vein thrombosis involving the left common femoral vein. - Findings consistent with acute superficial vein thrombosis involving the left great saphenous vein.  - Findings consistent with age indeterminate deep vein  thrombosis involving the left femoral vein, and left popliteal vein.   *See table(s) above for measurements and observations. Electronically signed by Jimmye Moulds MD on 09/04/2023 at  6:14:13 PM.    Final    DG Chest Port 1 View Result Date: 09/03/2023 CLINICAL DATA:  Cough. EXAM: PORTABLE CHEST 1 VIEW COMPARISON:  Chest radiograph dated 12/23/2021. FINDINGS: There is cardiomegaly with vascular congestion. Bilateral mid to lower lung field densities, likely edema. Pneumonia is not excluded. No large pleural effusion. No pneumothorax. Left pectoral pacemaker device. No acute osseous pathology. IMPRESSION: Cardiomegaly with vascular congestion and edema. Pneumonia is not excluded. Electronically Signed   By: Angus Bark M.D.   On: 09/03/2023 11:13   CUP PACEART REMOTE DEVICE CHECK Result Date: 08/26/2023 PPM Scheduled remote reviewed. Normal device function.  Presenting rhythm:  VP Recent increase in VP per trends, known heart block Next remote 91 days. LA, CVRS   Microbiology: Results for orders placed or performed during the hospital encounter of 09/03/23  Resp panel by RT-PCR (RSV, Flu A&B, Covid) Anterior Nasal Swab     Status: None   Collection Time: 09/03/23  9:41 AM   Specimen: Anterior Nasal Swab  Result Value Ref Range Status   SARS Coronavirus 2 by RT PCR NEGATIVE NEGATIVE Final   Influenza A by PCR NEGATIVE NEGATIVE Final   Influenza B by PCR NEGATIVE NEGATIVE Final    Comment: (NOTE) The Xpert Xpress SARS-CoV-2/FLU/RSV plus assay is intended as an aid in the diagnosis of influenza from Nasopharyngeal swab specimens and should not be used as a sole basis for treatment. Nasal washings and aspirates are unacceptable for Xpert Xpress SARS-CoV-2/FLU/RSV testing.  Fact Sheet for Patients: BloggerCourse.com  Fact Sheet for Healthcare Providers: SeriousBroker.it  This test is not yet approved or cleared by the [place]   FDA and has been authorized for detection and/or diagnosis of SARS-CoV-2 by FDA under an Emergency Use Authorization (EUA). This EUA will remain in effect (meaning this test can be used) for the duration of the COVID-19 declaration under Section 564(b)(1) of the Act, 21 U.S.C. section 360bbb-3(b)(1), unless the authorization is terminated or revoked.     Resp Syncytial Virus by PCR NEGATIVE NEGATIVE Final    Comment: (NOTE) Fact Sheet for Patients: BloggerCourse.com  Fact Sheet for Healthcare Providers: SeriousBroker.it  This test is not yet approved or cleared by the [place]  FDA and has been authorized for detection and/or diagnosis of SARS-CoV-2 by FDA under an Emergency Use Authorization (EUA). This EUA will remain in effect (meaning this test can be used) for the duration of the COVID-19 declaration under Section 564(b)(1) of the Act, 21 U.S.C. section 360bbb-3(b)(1), unless the authorization is terminated or revoked.  Performed at [facility], [address] [place], [place] [address]     Labs: CBC: Recent Labs  Lab 09/03/23 [phone] 09/03/23 0955 09/04/23 0406 09/05/23 0439 09/06/23 0440 09/07/23 0522 09/07/23 1005 09/07/23 1009 09/07/23 1011 09/07/23 1015 09/08/23 0523  WBC 6.7  --  5.9 5.3 5.7 5.7  --   --   --   --  5.6  NEUTROABS 5.6  --   --   --   --   --   --   --   --   --   --   HGB 11.7*   < > 10.0* 10.2* 10.1* 10.4* 10.5* 10.9* 11.2* 11.2* 10.3*  HCT 39.1   < > 32.3* 32.7* 33.1* 34.1* 31.0* 32.0* 33.0* 33.0* 33.3*  MCV 109.5*  --  107.7* 107.9* 107.8* 107.2*  --   --   --   --  108.5*  PLT 122*  --  115*  113* 107* 103*  --   --   --   --  91*   < > = values in this interval not displayed.   Basic Metabolic Panel: Recent Labs  Lab 09/03/23 1606 09/04/23 0406 09/05/23 0439 09/06/23 0440 09/07/23 0522 09/07/23 1005 09/07/23 1009 09/07/23 1011 09/07/23 1015 09/08/23 0523  NA  --   140 139 137 136 139 139 135 138 137  K  --  4.1 4.4 4.5 4.3 4.6 4.5 4.4 4.5 4.6  CL  --  103 104 102 101  --   --   --   --  103  CO2  --  29 28 27 27   --   --   --   --  28  GLUCOSE  --  160* 138* 126* 125*  --   --   --   --  127*  BUN  --  51* 54* 68* 73*  --   --   --   --  76*  CREATININE  --  1.99* 1.91* 2.13* 1.92*  --   --   --   --  2.20*  CALCIUM   --  8.8* 8.6* 9.0 8.8*  --   --   --   --  9.0  MG 2.0  --   --   --   --   --   --   --   --   --    Liver Function Tests: Recent Labs  Lab 09/03/23 0949 09/05/23 0439  AST 32 18  ALT 24 15  ALKPHOS 58 46  BILITOT 1.3* 0.8  PROT 6.5 5.3*  ALBUMIN  4.0 3.2*   CBG: Recent Labs  Lab 09/07/23 1156 09/07/23 1728 09/07/23 2100 09/08/23 0757 09/08/23 1206  GLUCAP 147* 148* 139* 185* 112*    Discharge time spent: approximately 45 minutes spent on discharge counseling, evaluation of patient on day of discharge, and coordination of discharge planning with nursing, social work, pharmacy and case management  Signed: Ephriam Hashimoto, MD [facility] 09/08/2023

## 2023-09-08 NOTE — Care Management Important Message (Signed)
 Important Message  Patient Details  Name: [name] MRN: [id number] Date of Birth: [birth year]   Important Message Given:  Yes - Medicare IM     Janith Melnick 09/08/2023, 10:32 AM

## 2023-09-08 NOTE — TOC Progression Note (Addendum)
 Transition of Care [facility]) - Progression Note    Patient Details  Name: [name] MRN: [id number] Date of Birth: [birth year]  [facility] CM/SW Contact  Carmon Christen, LCSWA Phone Number: 09/08/2023, 11:44 AM  Clinical Narrative:     CSW received consult for possible SNF placement at time of discharge. CSW followed up with patient regarding PT recommendation of SNF placement at time of discharge.  PTA patient is from home alone.Patient declined SNF placement at time of discharge. Patients plan is to return home when ready for dc.CSW informed Cornelius Dill CM. No further questions reported at this time. CSW to continue to follow and assist with discharge planning needs.   Expected Discharge Plan: Home w Home Health Services Barriers to Discharge: Continued Medical Work up  Expected Discharge Plan and Services In-house Referral: NA Discharge Planning Services: CM Consult Post Acute Care Choice: Home Health Living arrangements for the past 2 months: Single Family Home                   DME Agency: NA       HH Arranged: RN, Disease Management, PT, OT, Refused SNF HH Agency: [facility] Date [facility] Agency Contacted: 09/07/23 Time HH Agency Contacted: 1654 Representative spoke with at [facility] Agency: Amy   Social Determinants of Health (SDOH) Interventions SDOH Screenings   Food Insecurity: No Food Insecurity (09/03/2023)  Housing: Low Risk  (09/03/2023)  Transportation Needs: No Transportation Needs (09/03/2023)  Utilities: Not At Risk (09/03/2023)  Social Connections: Socially Isolated (09/03/2023)  Tobacco Use: Medium Risk (09/03/2023)    Readmission Risk Interventions     No data to display

## 2023-09-08 NOTE — Progress Notes (Signed)
 This chaplain responded to PMT NP-Brittany referral for creating the Pt. Advance Directive: HCPOA. The Pt. is not creating a Living Will.  The Pt. participated in AD education and answered clarifying questions before calling the notary.   The Pt. is naming Narda Bacon as her healthcare agent. If this person is unable or unwilling to serve in this role, the Pt. next choice is Mardee Shackle.  The chaplain is present with the Pt., notary and witnesses for the notarizing of the Pt. AD: HCPOA only.  The chaplain gave the Pt. the original document along with two copies. The chaplain scanned the Pt. AD into the Pt. EMR.  This chaplain is available for F/U spiritual care as needed.  Chaplain Kathleene Papas [phone]

## 2023-09-08 NOTE — Evaluation (Signed)
 Occupational Therapy Evaluation Patient Details Name: [name] MRN: [id number] DOB: 08/03/1955 Today's Date: 09/08/2023   History of Present Illness   68 y.o. female presents 5/29 with worsening shortness of breath and leg swelling. In ED noted to have HR 46-52, and increasing troponins Chest x-ray revealed cardiomegaly with vascular congestion and edema. 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 Pt currently requires minA for bed mobility and minA for LB dressing, toilet transfer and cga for grooming while standing at sink level. She is functioning below baseline secondary to increased O2 demand, decreased endurance and instability. Due to decline in current level of function, pt would benefit from acute OT to address established goals to facilitate safe D/C to venue listed below. At this time, recommend SNF follow-up. Will continue to follow acutely.  SpO2 on 2lnc upon arrival, at rest SpO2 95% with exertion desat to 87% on 2lnc requiring 3lnc to rebound to >90% with exertion.     If plan is discharge home, recommend the following:   A little help with walking and/or transfers;A little help with bathing/dressing/bathroom;Assistance with cooking/housework     Functional Status Assessment   Patient has had a recent decline in their functional status and demonstrates the ability to make significant improvements in function in a reasonable and predictable amount of time.     Equipment Recommendations   None recommended by OT     Recommendations for Other Services          Precautions/Restrictions   Precautions Precautions: Fall Restrictions Weight Bearing Restrictions Per Provider Order: No     Mobility Bed Mobility Overal bed mobility: Needs Assistance Bed Mobility: Supine to Sit     Supine to sit: HOB elevated, Min assist     General bed mobility comments: minA for trunk progression to sit EOB    Transfers Overall transfer level: Needs assistance Equipment used: Rolling walker (2 wheels) Transfers: Sit to/from Stand Sit to Stand: Contact guard assist           General transfer comment: cga for safety and stability      Balance Overall balance assessment: Needs assistance Sitting-balance support: Feet supported, Single extremity supported, Bilateral upper extremity supported Sitting balance-[name]: Fair     Standing balance support: During functional activity, Reliant on assistive device for balance, Bilateral upper extremity supported, Single extremity supported Standing balance-[name]: Fair Standing balance comment: able to static stand at sink, preference for UE support                           ADL either performed or assessed with clinical judgement   ADL Overall ADL's : Needs assistance/impaired Eating/Feeding: Independent   Grooming: Contact guard assist;Standing Grooming Details (indicate cue type and reason): completed grooming while standing at sink level Upper Body Bathing: Contact guard assist;Standing   Lower Body Bathing: Moderate assistance;Sit to/from stand   Upper Body Dressing : Set up;Sitting   Lower Body Dressing: Moderate assistance;Sit to/from stand   Toilet Transfer: Minimal assistance;Ambulation Toilet Transfer Details (indicate cue type and reason): short distance ~35feet she reports short  distance from bed to bathroom at home Toileting- Clothing Manipulation and Hygiene: Minimal assistance;Sit to/from stand       Functional mobility during ADLs: Minimal  assistance;Rolling walker (2 wheels) General ADL Comments: cga for safety and stability     Vision         Perception         Praxis         Pertinent Vitals/Pain Pain Assessment Pain Assessment: Faces Pain Location: bilateral knees with ambulation and standing at sink for grooming Pain Descriptors / Indicators: Grimacing, Guarding, Moaning, Sore Pain Intervention(s): Limited activity within patient's tolerance, Monitored during session     Extremity/Trunk Assessment Upper Extremity Assessment Upper Extremity Assessment: Overall WFL for tasks assessed;Generalized weakness RUE Deficits / Details: heart cath 6/2 reports soreness in wrist   Lower Extremity Assessment Lower Extremity Assessment: Defer to PT evaluation RLE Deficits / Details: decreased muscle mass in calfs, OA pain with standing and ambulation RLE Sensation: decreased light touch LLE Deficits / Details: decreased muscle mass in calfs, OA pain with standing and ambulation   Cervical / Trunk Assessment Cervical / Trunk Assessment: Kyphotic   Communication Communication Communication: No apparent difficulties   Cognition Arousal: Alert Behavior During Therapy: WFL for tasks assessed/performed Cognition: No apparent impairments                               Following commands: Intact       Cueing  General Comments   Cueing Techniques: Tactile cues;Visual cues;Gestural cues  SpO2 on 2lnc upon arrival, at rest SpO2 95% with exertion desat to 87% on 2lnc requiring 3lnc to rebound to >90% with exertion.   Exercises     Shoulder Instructions      Home Living Family/patient expects to be discharged to:: Private residence Living Arrangements: Alone Available Help at Discharge: Neighbor;Available PRN/intermittently Type of Home: House Home Access: Stairs to enter [employer] of Steps: 3 Entrance Stairs-Rails: Left Home Layout: One level     Bathroom Shower/Tub: Retail banker:  (comfort) Bathroom Accessibility: Yes   Home Equipment: Shower seat;Hand held shower head;Grab bars - tub/shower;Rollator (4 wheels);Rolling Walker (2 wheels)          Prior Functioning/Environment Prior Level of Function : Independent/Modified Independent             Mobility Comments: use RW for ambulation in house, rarely goes out ADLs Comments: mod I for ADLs, groceries delivered, uses [employer]    OT Problem List: Decreased activity tolerance;Impaired balance (sitting and/or standing);Decreased safety awareness;Cardiopulmonary status limiting activity;Decreased knowledge of precautions   OT Treatment/Interventions: Energy conservation;DME and/or AE instruction;Therapeutic activities;Patient/family education;Balance training      OT Goals(Current goals can be found in the care plan section)   Acute Rehab OT Goals Patient Stated Goal: to go home to her husky OT Goal Formulation: With patient Time For Goal Achievement: 09/22/23 Potential to Achieve Goals: Good ADL Goals Pt Will Perform Grooming: with modified independence;standing Pt Will Perform Lower Body Dressing: with modified independence;sit to/from stand;with adaptive equipment Pt Will Transfer to Toilet: with modified independence;ambulating Additional ADL Goal #1: Pt will demonstrate independence with 3 energy conservation strategies during ADL/IADL and functional mobility.   OT Frequency:  Min 2X/week    Co-evaluation              AM-PAC OT "6 Clicks" Daily Activity     Outcome Measure Help from  another person eating meals?: A Little Help from another person taking care of personal grooming?: A Little Help from another person toileting, which includes using toliet, bedpan, or urinal?: A Little Help from another person bathing (including washing, rinsing, drying)?: A Little Help from another person to put on and taking off regular upper body clothing?: A Little Help from  another person to put on and taking off regular lower body clothing?: A Little 6 Click Score: 18   End of Session Equipment Utilized During Treatment: Rolling walker (2 wheels);Oxygen Nurse Communication: Mobility status  Activity Tolerance: Patient tolerated treatment well;Patient limited by fatigue Patient left: in chair;with call bell/phone within reach;with chair alarm set  OT Visit Diagnosis: Other abnormalities of gait and mobility (R26.89);Muscle weakness (generalized) (M62.81)                Time: 1610-9604 OT Time Calculation (min): 23 min Charges:  OT General Charges $OT Visit: 1 Visit OT Evaluation $OT Eval Moderate Complexity: 1 Mod OT Treatments $Self Care/Home Management : 8-22 mins  Ammon Bales OTR/L Acute Rehabilitation Services Office: [phone]   Birtha Bullion 09/08/2023, 10:53 AM

## 2023-09-09 LAB — LIPOPROTEIN A (LPA): Lipoprotein (a): 76.9 nmol/L — ABNORMAL HIGH (ref <75.0)

## 2023-09-11 ENCOUNTER — Inpatient Hospital Stay (HOSPITAL_COMMUNITY)
Admission: EM | Admit: 2023-09-11 | Discharge: 2023-09-16 | DRG: 291 | Disposition: A | Discharge status: Skilled Nursing Facility | Attending: Internal Medicine | Admitting: Internal Medicine

## 2023-09-11 ENCOUNTER — Other Ambulatory Visit: Payer: Self-pay

## 2023-09-11 ENCOUNTER — Emergency Department (HOSPITAL_COMMUNITY)

## 2023-09-11 DIAGNOSIS — E1169 Type 2 diabetes mellitus with other specified complication: Secondary | ICD-10-CM | POA: Diagnosis present

## 2023-09-11 DIAGNOSIS — E782 Mixed hyperlipidemia: Secondary | ICD-10-CM | POA: Diagnosis present

## 2023-09-11 DIAGNOSIS — Z885 Allergy status to narcotic agent status: Secondary | ICD-10-CM

## 2023-09-11 DIAGNOSIS — Z7901 Long term (current) use of anticoagulants: Secondary | ICD-10-CM

## 2023-09-11 DIAGNOSIS — Z8249 Family history of ischemic heart disease and other diseases of the circulatory system: Secondary | ICD-10-CM

## 2023-09-11 DIAGNOSIS — J449 Chronic obstructive pulmonary disease, unspecified: Secondary | ICD-10-CM | POA: Diagnosis present

## 2023-09-11 DIAGNOSIS — I2721 Secondary pulmonary arterial hypertension: Secondary | ICD-10-CM | POA: Diagnosis present

## 2023-09-11 DIAGNOSIS — D696 Thrombocytopenia, unspecified: Secondary | ICD-10-CM | POA: Diagnosis present

## 2023-09-11 DIAGNOSIS — J9601 Acute respiratory failure with hypoxia: Secondary | ICD-10-CM | POA: Diagnosis not present

## 2023-09-11 DIAGNOSIS — Z794 Long term (current) use of insulin: Secondary | ICD-10-CM

## 2023-09-11 DIAGNOSIS — Z95828 Presence of other vascular implants and grafts: Secondary | ICD-10-CM

## 2023-09-11 DIAGNOSIS — Z961 Presence of intraocular lens: Secondary | ICD-10-CM | POA: Diagnosis present

## 2023-09-11 DIAGNOSIS — I5189 Other ill-defined heart diseases: Secondary | ICD-10-CM | POA: Diagnosis not present

## 2023-09-11 DIAGNOSIS — I10 Essential (primary) hypertension: Secondary | ICD-10-CM | POA: Diagnosis present

## 2023-09-11 DIAGNOSIS — G4733 Obstructive sleep apnea (adult) (pediatric): Secondary | ICD-10-CM | POA: Diagnosis present

## 2023-09-11 DIAGNOSIS — D649 Anemia, unspecified: Secondary | ICD-10-CM | POA: Diagnosis present

## 2023-09-11 DIAGNOSIS — I4821 Permanent atrial fibrillation: Secondary | ICD-10-CM | POA: Diagnosis present

## 2023-09-11 DIAGNOSIS — Z79899 Other long term (current) drug therapy: Secondary | ICD-10-CM

## 2023-09-11 DIAGNOSIS — Z9049 Acquired absence of other specified parts of digestive tract: Secondary | ICD-10-CM

## 2023-09-11 DIAGNOSIS — I480 Paroxysmal atrial fibrillation: Secondary | ICD-10-CM | POA: Diagnosis present

## 2023-09-11 DIAGNOSIS — I442 Atrioventricular block, complete: Secondary | ICD-10-CM | POA: Diagnosis present

## 2023-09-11 DIAGNOSIS — I2723 Pulmonary hypertension due to lung diseases and hypoxia: Secondary | ICD-10-CM | POA: Diagnosis present

## 2023-09-11 DIAGNOSIS — Z66 Do not resuscitate: Secondary | ICD-10-CM | POA: Diagnosis present

## 2023-09-11 DIAGNOSIS — R791 Abnormal coagulation profile: Secondary | ICD-10-CM | POA: Diagnosis present

## 2023-09-11 DIAGNOSIS — M109 Gout, unspecified: Secondary | ICD-10-CM | POA: Diagnosis present

## 2023-09-11 DIAGNOSIS — N1832 Chronic kidney disease, stage 3b: Secondary | ICD-10-CM | POA: Diagnosis present

## 2023-09-11 DIAGNOSIS — E039 Hypothyroidism, unspecified: Secondary | ICD-10-CM | POA: Diagnosis present

## 2023-09-11 DIAGNOSIS — I2510 Atherosclerotic heart disease of native coronary artery without angina pectoris: Secondary | ICD-10-CM | POA: Diagnosis present

## 2023-09-11 DIAGNOSIS — I441 Atrioventricular block, second degree: Secondary | ICD-10-CM | POA: Diagnosis present

## 2023-09-11 DIAGNOSIS — E662 Morbid (severe) obesity with alveolar hypoventilation: Secondary | ICD-10-CM | POA: Diagnosis present

## 2023-09-11 DIAGNOSIS — I130 Hypertensive heart and chronic kidney disease with heart failure and stage 1 through stage 4 chronic kidney disease, or unspecified chronic kidney disease: Principal | ICD-10-CM | POA: Diagnosis present

## 2023-09-11 DIAGNOSIS — E1122 Type 2 diabetes mellitus with diabetic chronic kidney disease: Secondary | ICD-10-CM | POA: Diagnosis present

## 2023-09-11 DIAGNOSIS — Z9981 Dependence on supplemental oxygen: Secondary | ICD-10-CM

## 2023-09-11 DIAGNOSIS — I5033 Acute on chronic diastolic (congestive) heart failure: Secondary | ICD-10-CM | POA: Diagnosis present

## 2023-09-11 DIAGNOSIS — Z86718 Personal history of other venous thrombosis and embolism: Secondary | ICD-10-CM

## 2023-09-11 DIAGNOSIS — I890 Lymphedema, not elsewhere classified: Secondary | ICD-10-CM | POA: Diagnosis present

## 2023-09-11 DIAGNOSIS — Z6841 Body Mass Index (BMI) 40.0 and over, adult: Secondary | ICD-10-CM

## 2023-09-11 DIAGNOSIS — N179 Acute kidney failure, unspecified: Secondary | ICD-10-CM | POA: Diagnosis present

## 2023-09-11 DIAGNOSIS — Z86711 Personal history of pulmonary embolism: Secondary | ICD-10-CM

## 2023-09-11 DIAGNOSIS — Z950 Presence of cardiac pacemaker: Secondary | ICD-10-CM

## 2023-09-11 DIAGNOSIS — J9621 Acute and chronic respiratory failure with hypoxia: Principal | ICD-10-CM | POA: Diagnosis present

## 2023-09-11 DIAGNOSIS — F419 Anxiety disorder, unspecified: Secondary | ICD-10-CM | POA: Diagnosis present

## 2023-09-11 DIAGNOSIS — Z1152 Encounter for screening for COVID-19: Secondary | ICD-10-CM

## 2023-09-11 DIAGNOSIS — K219 Gastro-esophageal reflux disease without esophagitis: Secondary | ICD-10-CM | POA: Diagnosis present

## 2023-09-11 DIAGNOSIS — Z79890 Hormone replacement therapy (postmenopausal): Secondary | ICD-10-CM

## 2023-09-11 DIAGNOSIS — Z87891 Personal history of nicotine dependence: Secondary | ICD-10-CM

## 2023-09-11 LAB — CBC WITH DIFFERENTIAL/PLATELET
Abs Immature Granulocytes: 0.02 10*3/uL (ref 0.00–0.07)
Basophils Absolute: 0 10*3/uL (ref 0.0–0.1)
Basophils Relative: 0 %
Eosinophils Absolute: 0.1 10*3/uL (ref 0.0–0.5)
Eosinophils Relative: 1 %
HCT: 34.1 % — ABNORMAL LOW (ref 36.0–46.0)
Hemoglobin: 10.6 g/dL — ABNORMAL LOW (ref 12.0–15.0)
Immature Granulocytes: 0 %
Lymphocytes Relative: 6 %
Lymphs Abs: 0.4 10*3/uL — ABNORMAL LOW (ref 0.7–4.0)
MCH: 32.9 pg (ref 26.0–34.0)
MCHC: 31.1 g/dL (ref 30.0–36.0)
MCV: 105.9 fL — ABNORMAL HIGH (ref 80.0–100.0)
Monocytes Absolute: 0.5 10*3/uL (ref 0.1–1.0)
Monocytes Relative: 6 %
Neutro Abs: 6.1 10*3/uL (ref 1.7–7.7)
Neutrophils Relative %: 87 %
Platelets: 120 10*3/uL — ABNORMAL LOW (ref 150–400)
RBC: 3.22 MIL/uL — ABNORMAL LOW (ref 3.87–5.11)
RDW: 14.8 % (ref 11.5–15.5)
WBC: 7.1 10*3/uL (ref 4.0–10.5)
nRBC: 0 % (ref 0.0–0.2)

## 2023-09-11 LAB — BRAIN NATRIURETIC PEPTIDE: B Natriuretic Peptide: 598.4 pg/mL — ABNORMAL HIGH (ref 0.0–100.0)

## 2023-09-11 LAB — CBG MONITORING, ED: Glucose-Capillary: 135 mg/dL — ABNORMAL HIGH (ref 70–99)

## 2023-09-11 LAB — BASIC METABOLIC PANEL WITH GFR
Anion gap: 13 (ref 5–15)
BUN: 88 mg/dL — ABNORMAL HIGH (ref 8–23)
CO2: 23 mmol/L (ref 22–32)
Calcium: 9.6 mg/dL (ref 8.9–10.3)
Chloride: 104 mmol/L (ref 98–111)
Creatinine, Ser: 2.38 mg/dL — ABNORMAL HIGH (ref 0.44–1.00)
GFR, Estimated: 22 mL/min — ABNORMAL LOW (ref >60)
Glucose, Bld: 161 mg/dL — ABNORMAL HIGH (ref 70–99)
Potassium: 4.8 mmol/L (ref 3.5–5.1)
Sodium: 140 mmol/L (ref 135–145)

## 2023-09-11 LAB — MAGNESIUM: Magnesium: 2.5 mg/dL — ABNORMAL HIGH (ref 1.7–2.4)

## 2023-09-11 MED ORDER — APIXABAN 5 MG PO TABS: 5.0000 mg | ORAL_TABLET | Freq: Two times a day (BID) | ORAL | Status: DC

## 2023-09-11 MED ORDER — ACETAMINOPHEN 650 MG RE SUPP: 650.0000 mg | Freq: Four times a day (QID) | RECTAL | Status: DC | PRN

## 2023-09-11 MED ORDER — SODIUM CHLORIDE 0.9% FLUSH
3.0000 mL | Freq: Two times a day (BID) | INTRAVENOUS | Status: DC
  Administered 2023-09-11 – 2023-09-16 (×10): 3 mL via INTRAVENOUS

## 2023-09-11 MED ORDER — MELATONIN 5 MG PO TABS
5.0000 mg | ORAL_TABLET | Freq: Every day | ORAL | Status: DC
  Administered 2023-09-11 – 2023-09-15 (×5): 5 mg via ORAL
  Filled 2023-09-11 (×5): qty 1

## 2023-09-11 MED ORDER — INSULIN ASPART 100 UNIT/ML IJ SOLN
0.0000 [IU] | Freq: Three times a day (TID) | INTRAMUSCULAR | Status: DC
  Administered 2023-09-11 – 2023-09-12 (×2): 2 [IU] via SUBCUTANEOUS
  Administered 2023-09-12: 3 [IU] via SUBCUTANEOUS
  Administered 2023-09-12 – 2023-09-13 (×2): 2 [IU] via SUBCUTANEOUS
  Administered 2023-09-13 – 2023-09-15 (×4): 3 [IU] via SUBCUTANEOUS
  Administered 2023-09-16: 2 [IU] via SUBCUTANEOUS

## 2023-09-11 MED ORDER — LEVOTHYROXINE SODIUM 75 MCG PO TABS
75.0000 ug | ORAL_TABLET | Freq: Every day | ORAL | Status: DC
  Administered 2023-09-12 – 2023-09-16 (×5): 75 ug via ORAL
  Filled 2023-09-11 (×5): qty 1

## 2023-09-11 MED ORDER — APIXABAN 5 MG PO TABS
10.0000 mg | ORAL_TABLET | Freq: Two times a day (BID) | ORAL | Status: AC
  Administered 2023-09-11 – 2023-09-15 (×10): 10 mg via ORAL
  Filled 2023-09-11 (×10): qty 2

## 2023-09-11 MED ORDER — FUROSEMIDE 20 MG PO TABS
80.0000 mg | ORAL_TABLET | Freq: Two times a day (BID) | ORAL | Status: DC
  Administered 2023-09-11 – 2023-09-12 (×2): 80 mg via ORAL
  Filled 2023-09-11 (×2): qty 4

## 2023-09-11 MED ORDER — CITALOPRAM HYDROBROMIDE 20 MG PO TABS
20.0000 mg | ORAL_TABLET | Freq: Every day | ORAL | Status: DC
  Administered 2023-09-11 – 2023-09-15 (×5): 20 mg via ORAL
  Filled 2023-09-11 (×4): qty 1
  Filled 2023-09-11: qty 2

## 2023-09-11 MED ORDER — POLYETHYLENE GLYCOL 3350 17 G PO PACK: 17.0000 g | PACK | Freq: Every day | ORAL | Status: DC | PRN

## 2023-09-11 MED ORDER — FUROSEMIDE 10 MG/ML IJ SOLN
40.0000 mg | Freq: Once | INTRAMUSCULAR | Status: AC
  Administered 2023-09-11: 40 mg via INTRAVENOUS
  Filled 2023-09-11: qty 4

## 2023-09-11 MED ORDER — ACETAMINOPHEN 325 MG PO TABS
650.0000 mg | ORAL_TABLET | Freq: Four times a day (QID) | ORAL | Status: DC | PRN
  Administered 2023-09-13 – 2023-09-15 (×4): 650 mg via ORAL
  Filled 2023-09-11 (×4): qty 2

## 2023-09-11 MED ORDER — AMLODIPINE BESYLATE 5 MG PO TABS
5.0000 mg | ORAL_TABLET | Freq: Every day | ORAL | Status: DC
  Administered 2023-09-12 – 2023-09-16 (×5): 5 mg via ORAL
  Filled 2023-09-11 (×5): qty 1

## 2023-09-11 MED ORDER — APIXABAN 5 MG PO TABS: 10.0000 mg | ORAL_TABLET | Freq: Two times a day (BID) | ORAL | Status: DC

## 2023-09-11 MED ORDER — DIAZEPAM 5 MG PO TABS: 5.0000 mg | ORAL_TABLET | Freq: Two times a day (BID) | ORAL | Status: DC | PRN

## 2023-09-11 MED ORDER — ALLOPURINOL 300 MG PO TABS
300.0000 mg | ORAL_TABLET | Freq: Every day | ORAL | Status: DC
  Administered 2023-09-12 – 2023-09-16 (×5): 300 mg via ORAL
  Filled 2023-09-11 (×4): qty 1
  Filled 2023-09-11: qty 3

## 2023-09-11 MED ORDER — INSULIN GLARGINE-YFGN 100 UNIT/ML ~~LOC~~ SOLN
15.0000 [IU] | Freq: Every day | SUBCUTANEOUS | Status: DC
  Administered 2023-09-12 – 2023-09-16 (×5): 15 [IU] via SUBCUTANEOUS
  Filled 2023-09-11 (×6): qty 0.15

## 2023-09-11 MED ORDER — ROSUVASTATIN CALCIUM 5 MG PO TABS
10.0000 mg | ORAL_TABLET | Freq: Every day | ORAL | Status: DC
  Administered 2023-09-12 – 2023-09-16 (×5): 10 mg via ORAL
  Filled 2023-09-11 (×5): qty 2

## 2023-09-11 NOTE — ED Provider Notes (Signed)
 [place] EMERGENCY DEPARTMENT AT [facility] Provider Note   CSN: [id number] Arrival date & time: 09/11/23  1005     History  Chief Complaint  Patient presents with   Shortness of Breath    [name] is a 68 y.o. female.  The history is provided by the patient, the EMS personnel and medical records. No language interpreter was used.  Shortness of Breath    68 year old female history of prior PE currently on Eliquis  after transition from Coumadin  recently.  She does have an IVC filter, history of paroxysmal atrial flutter fibrillation, diabetes, CKD, CHF with an EF of 60-65% on echo done a week ago, recently hospitalized last week for shortness of breath thought to be related to CHF status patient presenting to the ER via EMS from home with complaints of worsening shortness of breath.  Patient was recently discharged from the hospital 3 days ago to return home.  She was told that she needs to go to rehab but she prefers to go home initially because she wants to care for her dog at home.  She lives at home by herself.  However since being home she endorsed progressive worsening shortness of breath with any form of exertion and she is unable to perform her ADL.  At this point she felt that she would likely need to be admitted for further management.  Furthermore, she noticed she is not getting much oxygen from her oxygen tank and when EMS arrived they noticed that her tank was empty.  She is normally on 2 L at home and just recently started with supplemental oxygen when she was discharged from the hospital.  She does not endorse any significant pain no fever.  Her cough is productive with white sputum only.  No nausea vomiting diarrhea but states she has not had energy to cook make any food and only have a few crackers since she was discharged.  Home Medications Prior to Admission medications   Medication Sig Start Date End Date Taking? Authorizing Provider  allopurinol   (ZYLOPRIM ) 300 MG tablet Take 300 mg by mouth daily.    [provider]  amLODipine  (NORVASC ) 5 MG tablet Take 1 tablet (5 mg total) by mouth daily. 09/08/23 09/07/24  DanfordWillis Harter, MD  APIXABAN  (ELIQUIS ) VTE STARTER PACK (10MG  AND 5MG ) Take as directed on package: start with two-5mg  tablets twice daily for 7 days. On day 8, switch to one-5mg  tablet twice daily. 09/08/23   Danford, Willis Harter, MD  Biotin w/ Vitamins C & E (HAIR SKIN & NAILS GUMMIES PO) Take 2 each by mouth daily at 4 PM.    [provider]  citalopram  (CELEXA ) 20 MG tablet Take 20 mg by mouth at bedtime.    [provider]  diazepam  (VALIUM ) 5 MG tablet Take 5 mg by mouth 2 (two) times daily as needed for anxiety or muscle spasms.    [provider]  diphenhydramine -acetaminophen  (TYLENOL  PM) 25-500 MG TABS tablet Take 1 tablet by mouth at bedtime.    [provider]  Ferrous Sulfate  (IRON  PO) Take 1 tablet by mouth daily.    [provider]  insulin  aspart (NOVOLOG ) 100 UNIT/ML injection Inject 6 Units into the skin 3 (three) times daily with meals. Patient taking differently: Inject 10 Units into the skin 3 (three) times daily with meals. 09/12/21   Deforest Fast, MD  insulin  glargine (LANTUS  SOLOSTAR) 100 UNIT/ML Solostar Pen Inject 20 Units into the skin daily.  [provider]  levothyroxine  (SYNTHROID ) 75 MCG tablet Take 75 mcg by mouth daily before breakfast.     [provider]  MELATONIN PO Take 2 each by mouth at bedtime. Melatonin gummies    [provider]  Multiple Vitamins-Minerals (CENTRUM SILVER) CHEW Chew 2 each by mouth daily.    [provider]  rosuvastatin  (CRESTOR ) 10 MG tablet Take 1 tablet (10 mg total) by mouth daily. 05/09/16   Michae Aden, MD  spironolactone  (ALDACTONE ) 25 MG tablet Take 12.5 mg by mouth at bedtime.    [provider]  torsemide  (DEMADEX ) 20 MG tablet Take 2 tablets (40mg )  by mouth twice daily, in the morning and at 4:00pm 09/08/23   Danford, Willis Harter, MD      Allergies    Ms contin [morphine]    Review of Systems   Review of Systems  Respiratory:  Positive for shortness of breath.   All other systems reviewed and are negative.   Physical Exam Updated Vital Signs BP (!) 171/51 (BP Location: Left Wrist)   Pulse (!) 55   Temp 97.9 F (36.6 C)   Resp (!) 23   SpO2 100%  Physical Exam Vitals and nursing note reviewed.  Constitutional:      General: She is not in acute distress.    Appearance: She is well-developed. She is obese.  HENT:     Head: Atraumatic.  Eyes:     Conjunctiva/sclera: Conjunctivae normal.  Cardiovascular:     Rate and Rhythm: Bradycardia present.  Pulmonary:     Effort: Pulmonary effort is normal.     Breath sounds: Decreased breath sounds present. No wheezing, rhonchi or rales.  Musculoskeletal:     Cervical back: Neck supple.  Skin:    Findings: No rash.     Comments: Chronic venous stasis skin changes noted to bilateral lower extremities extending towards her knees with a couple of bulla on the anterior surface of the right lower extremity.  No signs of infection.  Intact DP pulse.  Trace edema noted.  Neurological:     Mental Status: She is alert and oriented to person, place, and time.  Psychiatric:        Mood and Affect: Mood normal.     ED Results / Procedures / Treatments   Labs (all labs ordered are listed, but only abnormal results are displayed) Labs Reviewed  BASIC METABOLIC PANEL WITH GFR - Abnormal; Notable for the following components:      Result Value   Glucose, Bld 161 (*)    BUN 88 (*)    Creatinine, Ser 2.38 (*)    GFR, Estimated 22 (*)    All other components within normal limits  CBC WITH DIFFERENTIAL/PLATELET - Abnormal; Notable for the following components:   RBC 3.22 (*)    Hemoglobin 10.6 (*)    HCT 34.1 (*)    MCV 105.9 (*)    Platelets 120 (*)    Lymphs Abs 0.4 (*)    All  other components within normal limits  BRAIN NATRIURETIC PEPTIDE - Abnormal; Notable for the following components:   B Natriuretic Peptide 598.4 (*)    All other components within normal limits  MAGNESIUM     EKG None ED ECG REPORT   Date: 09/11/2023  Rate: 50  Rhythm: Ventricular paced rhythm  QRS Axis: left  Intervals: normal  ST/T Wave abnormalities: paced rhythm  Conduction Disutrbances:paced rhythm  Narrative Interpretation:   Old EKG Reviewed: unchanged  I have personally reviewed the EKG tracing and agree with the computerized printout as noted.   Radiology DG Chest 2 View Result Date: 09/11/2023 CLINICAL DATA:  Shortness of breath EXAM: CHEST - 2 VIEW COMPARISON:  Sep 03, 2023 FINDINGS: Persistent bilateral pulmonary infiltrates correlate with bilateral congestive changes mild cardiomegaly correlate with CHF. No change in the left subclavian pacemaker device IMPRESSION: Mild CHF Electronically Signed   By: Fredrich Jefferson M.D.   On: 09/11/2023 11:32    Procedures Procedures    Medications Ordered in ED Medications  amLODipine  (NORVASC ) tablet 5 mg (has no administration in time range)  allopurinol  (ZYLOPRIM ) tablet 300 mg (has no administration in time range)  rosuvastatin  (CRESTOR ) tablet 10 mg (has no administration in time range)  diazepam  (VALIUM ) tablet 5 mg (has no administration in time range)  citalopram  (CELEXA ) tablet 20 mg (has no administration in time range)  levothyroxine  (SYNTHROID ) tablet 75 mcg (has no administration in time range)  apixaban  (ELIQUIS ) tablet 10 mg (has no administration in time range)    Followed by  apixaban  (ELIQUIS ) tablet 5 mg (has no administration in time range)  melatonin tablet 5 mg (has no administration in time range)  furosemide  (LASIX ) tablet 80 mg (has no administration in time range)  sodium chloride  flush (NS) 0.9 % injection 3 mL (has no administration in time range)  acetaminophen  (TYLENOL ) tablet 650 mg (has no  administration in time range)    Or  acetaminophen  (TYLENOL ) suppository 650 mg (has no administration in time range)  polyethylene glycol (MIRALAX  / GLYCOLAX ) packet 17 g (has no administration in time range)  furosemide  (LASIX ) injection 40 mg (40 mg Intravenous Given 09/11/23 1247)    ED Course/ Medical Decision Making/ A&P                                 Medical Decision Making Amount and/or Complexity of Data Reviewed Labs: ordered. Radiology: ordered.  Risk Prescription drug management.   BP (!) 171/51 (BP Location: Left Wrist)   Pulse (!) 55   Temp 97.9 F (36.6 C)   Resp (!) 23   SpO2 100%   38:98 AM  68 year old female history of prior PE currently on Eliquis  after transition from Coumadin  recently.  She does have an IVC filter, history of paroxysmal atrial flutter fibrillation, diabetes, CKD, CHF with an EF of 60-65% on echo done a week ago, recently hospitalized last week for shortness of breath thought to be related to CHF status patient presenting to the ER via EMS from home with complaints of worsening shortness of breath.  Patient was recently discharged from the hospital 3 days ago to return home.  She was told that she needs to go to rehab but she prefers to go home initially because she wants to care for her dog at home.  She lives at home by herself.  However since being home she endorsed progressive worsening shortness of breath with any form of exertion and she is unable to perform her ADL.  At this point she felt that she would likely need to be admitted for further management.  Furthermore, she noticed she is not getting much oxygen from her oxygen tank and when EMS arrived they noticed that her tank was empty.  She is normally on 2 L at home and just recently started with supplemental oxygen when she was discharged from the hospital.  She does not endorse  any significant pain no fever.  Her cough is productive with white sputum only.  No nausea vomiting diarrhea but  states she has not had energy to cook make any food and only have a few crackers since she was discharged.  On exam this is a morbidly obese female laying in bed in in no significant respiratory discomfort.  She is currently on supplemental oxygen at 2 L.  She is able to speak in complete sentences.  She does have diminished lung sounds without significant crackles.  She has peripheral edema with chronic venous skin changes to her lower extremities.  No obvious JVD noted.  Vital sign notable for elevated blood pressure of 171/51, she is bradycardic with heart rate of 55, mild respiratory tachypnea with respiratory 23.  She is satting at 1% on 2 L.  Her BMI is 56.  -Labs ordered, independently viewed and interpreted by me.  Labs remarkable for creatinine of 2.38 slightly worse than prior, BNP of 598 slightly worse than prior -The patient was maintained on a cardiac monitor.  I personally viewed and interpreted the cardiac monitored which showed an underlying rhythm of: Paced rhythm -Imaging independently viewed and interpreted by me and I agree with radiologist's interpretation.  Result remarkable for mild CHF -This patient presents to the ED for concern of shortness of breath, this involves an extensive number of treatment options, and is a complaint that carries with it a high risk of complications and morbidity.  The differential diagnosis includes CHF exacerbation, COPD, pulmonary hypertension, PE, pneumonia, MI -Co morbidities that complicate the patient evaluation includes COPD, CHF, CAD -Treatment includes Lasix , supplemental oxygen -Reevaluation of the patient after these medicines showed that the patient stayed the same -PCP office notes or outside notes reviewed -Discussion with specialist [facility] hospitalist, Dr. Rufina Cough who agrees to admit patient -Escalation to admission/observation considered: pt agrees with admission         Final Clinical Impression(s) / ED Diagnoses Final  diagnoses:  Acute on chronic respiratory failure with hypoxia [facility])    Rx / DC Orders ED Discharge Orders     None         Debbra Fairy, PA-C 09/11/23 1439    Lind Repine, MD 09/12/23 1458

## 2023-09-11 NOTE — H&P (Addendum)
 History and Physical   KRYSTALYN KUBOTA ZOX:[id number] DOB: 06-Oct-1955 DOA: 09/11/2023  PCP: Edda Goo, MD   Patient coming from: Home  Chief Complaint: Shortness of breath  HPI: LAQUESHA HOLCOMB is a 68 y.o. female with medical history significant of hypertension, hyperlipidemia, diabetes, CKD 3B, hypothyroidism, CAD, complete heart block status post pacemaker, chronic diastolic CHF, pulmonary embolism, DVT, status post IVC, anemia, thrombocytopenia, OSA, OHS, obesity, pulmonary hypertension type III, atrial fibrillation presenting with worsening shortness of breath.  Patient admitted 5/29-6/30 for shortness of breath.  This was in the setting of type III pulmonary hypertension, OSA, OHS, CHF.  Patient received IV diuresis with minimal improvement and actually creatinine increased.  Specialist consultation was obtained and patient underwent right and left heart cath with clean left heart cath.  Right heart cath showed normal pulmonary capillary wedge pressure.  Recommendation was no further role for diuresis at that time but if patient had significant downturn trial of high-dose diuresis would be appropriate.  Patient was discharged on previous p.o. diuresis.  Was recommended to go to SNF but patient declined in favor of returning home to care for her pet.  Home health was ordered.  She was also recommended home oxygen for activity, which she has used but ran out of already. Patient was seen by palliative medicine and after discussion decided to have her CODE STATUS to be DNR/DNI.  Patient reports shortness of breath persistent since discharge which has been worsening.  I am not able to do even minimal activities.  EMS was called today and patient found to be 85% on room air.  Initially placed on 4 L with saturations of 100% and then able to be weaned down to 2 L with saturations of 100%.  Blood pressure stable.  She has not been using CPAP, says she turns on her sleep and the masks always come  off.   Patient denies fevers, chills, chest pain, abdominal pain, constipation, diarrhea, nausea, vomiting.  ED Course: Vital signs in the ED notable for blood pressure in the 150s-170 systolic, heart rate in the 40s-50s.  Requiring 2 L to maintain saturations.  Lab workup included BMP with BUN 88, creatinine 2.38 which is similar to 2.22 days ago but increased from 1.94 days ago.  Glucose 161.  CBC with hemoglobin stable at 10.6 and platelets stable at 120.  BNP elevated from previous admission at 598.  Chest x-ray showed evidence of mild CHF.  Patient received 40 mg IV Lasix  in the ED.  Review of Systems: As per HPI otherwise all other systems reviewed and are negative.  Past Medical History:  Diagnosis Date   A-fib (HCC)    Anemia    Anticoagulated on Coumadin , chronically 09/03/2011   Anxiety    Arthritis    "right hip; both knees; left wrist/shoulder; back" (01/19/2013"   Bleeding on Coumadin  08/2012; 01/18/2013   [facility] admissions (01/19/2013)   CHF (congestive heart failure) (HCC)    "2-3 times" (01/19/2013)   Chronic lower back pain    Depression    DVT (deep venous thrombosis) (HCC) 10 years ago   numerous/notes 01/18/2013   GERD (gastroesophageal reflux disease)    Gout    Headache(784.0)    "maybe weekly" (01/19/2013)   Heart murmur    High cholesterol    "been off RX for this at one time" (01/18/2013)   History of blood transfusion 1983; 04/2012   "3 w/ childbirth; hospitalized for pain" (01/19/2013)   Hypertension  Hypothyroidism    Migraines    "twice/yr maybe" (01/19/2013)   Obstructive sleep apnea 05/03/2012   OSA (obstructive sleep apnea)    "sent me for test in 04/2012; never ordered mask, etc" (01/19/2013)   PE (pulmonary thromboembolism) (HCC) 3 years ag0   3/notes 01/18/2013   Pneumonia before 2011   "once' (01/18/2013)   Renal disorder    kindey function low; "Metformin was destroying my kidneys" (01/19/2013)   Shortness of breath    "only related to  my CHF" (01/18/2013)   Swelling of hand 08/31/2014   RT HAND   Type II diabetes mellitus (HCC)    UTI (urinary tract infection) 08/31/2014    Past Surgical History:  Procedure Laterality Date   CARDIAC CATHETERIZATION N/A 03/13/2015   Procedure: Right/Left Heart Cath and Coronary Angiography;  Surgeon: Knox Perl, MD;  Location: [facility] INVASIVE CV LAB;  Service: Cardiovascular;  Laterality: N/A;   CATARACT EXTRACTION W/ INTRAOCULAR LENS  IMPLANT, BILATERAL Bilateral 2006-2011   CESAREAN SECTION  1983   CHOLECYSTECTOMY  ~ 2002   COLONOSCOPY N/A 01/21/2013   Procedure: COLONOSCOPY;  Surgeon: Almeda Aris, MD;  Location: [facility] ENDOSCOPY;  Service: Endoscopy;  Laterality: N/A;   EYE SURGERY Bilateral    "multiple" (01/18/2013)   PACEMAKER IMPLANT N/A 08/31/2018   Symptomatic bradycardia due to mobitz II second degree AV block, permanent afib/ atypical atrial flutter implanted by Dr Nunzio Belch   PARS PLANA REPAIR OF RETINAL DEATACHMENT Right    PARS PLANA VITRECTOMY Bilateral 2004-2006   "several" (01/18/2013)   REFRACTIVE SURGERY Bilateral    "for stigmatism" (01/18/2013)   REFRACTIVE SURGERY Left ~ 11/2012   "to puff it up cause vision got hazy" (01/18/2013)   RIGHT HEART CATH N/A 12/30/2018   Procedure: RIGHT HEART CATH;  Surgeon: Mardell Shade, MD;  Location: [facility] INVASIVE CV LAB;  Service: Cardiovascular;  Laterality: N/A;   RIGHT/LEFT HEART CATH AND CORONARY ANGIOGRAPHY N/A 09/07/2023   Procedure: RIGHT/LEFT HEART CATH AND CORONARY ANGIOGRAPHY;  Surgeon: Kyra Phy, MD;  Location: [facility] INVASIVE CV LAB;  Service: Cardiovascular;  Laterality: N/A;   VENA CAVA FILTER PLACEMENT  2011?    Social History  reports that she quit smoking about 35 years ago. Her smoking use included cigarettes. She started smoking about 65 years ago. She has a 1.5 pack-year smoking history. She has never used smokeless tobacco. She reports that she does not currently use alcohol. She reports that she does not use  drugs.  Allergies  Allergen Reactions   Ms Contin [Morphine] Hives and Rash         Family History  Problem Relation Age of Onset   Cerebral aneurysm Mother    Hypertension Father    Cerebral aneurysm Maternal Grandfather    Cerebral aneurysm Maternal Aunt    Cancer Maternal Uncle   Reviewed on admission  Prior to Admission medications   Medication Sig Start Date End Date Taking? Authorizing Provider  allopurinol  (ZYLOPRIM ) 300 MG tablet Take 300 mg by mouth daily.   Yes [provider]  amLODipine  (NORVASC ) 5 MG tablet Take 1 tablet (5 mg total) by mouth daily. 09/08/23 09/07/24 Yes Danford, Willis Harter, MD  APIXABAN Herby Lolling) VTE STARTER PACK (10MG  AND 5MG ) Take as directed on package: start with two-5mg  tablets twice daily for 7 days. On day 8, switch to one-5mg  tablet twice daily. 09/08/23  Yes Danford, Willis Harter, MD  Biotin w/ Vitamins C & E (HAIR SKIN & NAILS GUMMIES PO) Take  2 each by mouth daily at 4 PM.   Yes [provider]  citalopram  (CELEXA ) 20 MG tablet Take 20 mg by mouth at bedtime.   Yes [provider]  diazepam  (VALIUM ) 5 MG tablet Take 5 mg by mouth 2 (two) times daily as needed for anxiety or muscle spasms.   Yes [provider]  diphenhydramine -acetaminophen  (TYLENOL  PM) 25-500 MG TABS tablet Take 1 tablet by mouth at bedtime.   Yes [provider]  Ferrous Sulfate  (IRON  PO) Take 1 tablet by mouth daily.   Yes [provider]  insulin  aspart (NOVOLOG ) 100 UNIT/ML injection Inject 6 Units into the skin 3 (three) times daily with meals. Patient taking differently: Inject 10 Units into the skin 3 (three) times daily with meals. 09/12/21  Yes Deforest Fast, MD  insulin  glargine (LANTUS  SOLOSTAR) 100 UNIT/ML Solostar Pen Inject 20 Units into the skin daily.   Yes [provider]  levothyroxine  (SYNTHROID ) 75 MCG tablet Take 75 mcg by mouth daily before breakfast.    Yes [provider]  MELATONIN  PO Take 2 each by mouth at bedtime. Melatonin gummies   Yes [provider]  Multiple Vitamins-Minerals (CENTRUM SILVER) CHEW Chew 2 each by mouth daily.   Yes [provider]  rosuvastatin  (CRESTOR ) 10 MG tablet Take 1 tablet (10 mg total) by mouth daily. 05/09/16  Yes Michae Aden, MD  torsemide  (DEMADEX ) 20 MG tablet Take 2 tablets (40mg ) by mouth twice daily, in the morning and at 4:00pm 09/08/23  Yes Danford, Willis Harter, MD  spironolactone  (ALDACTONE ) 25 MG tablet Take 12.5 mg by mouth at bedtime.    [provider]    Physical Exam: Vitals:   09/11/23 1315 09/11/23 1330 09/11/23 1345 09/11/23 1400  BP: (!) 155/123 (!) 155/50 (!) 158/53 (!) 158/59  Pulse: (!) 50 (!) 50 (!) 49 (!) 49  Resp: 20 18 15 20   Temp:      SpO2: 100% 100% 100% 97%    Physical Exam Constitutional:      General: She is not in acute distress.    Appearance: Normal appearance. She is obese.  HENT:     Head: Normocephalic and atraumatic.     Mouth/Throat:     Mouth: Mucous membranes are moist.     Pharynx: Oropharynx is clear.  Eyes:     Extraocular Movements: Extraocular movements intact.     Pupils: Pupils are equal, round, and reactive to light.  Cardiovascular:     Rate and Rhythm: Normal rate and regular rhythm.     Pulses: Normal pulses.     Heart sounds: Normal heart sounds.  Pulmonary:     Effort: Pulmonary effort is normal. No respiratory distress.     Breath sounds: Decreased breath sounds present.  Abdominal:     General: Bowel sounds are normal. There is no distension.     Palpations: Abdomen is soft.     Tenderness: There is no abdominal tenderness.  Musculoskeletal:        General: No swelling or deformity.     Right lower leg: Edema present.     Left lower leg: Edema present.  Skin:    General: Skin is warm and dry.  Neurological:     General: No focal deficit present.     Mental Status: Mental status is at baseline.    Labs on Admission: I  have personally reviewed following labs and imaging studies  CBC: Recent Labs  Lab 09/05/23 0439  09/06/23 0440 09/07/23 0522 09/07/23 1005 09/07/23 1009 09/07/23 1011 09/07/23 1015 09/08/23 0523 09/11/23 1219  WBC 5.3 5.7 5.7  --   --   --   --  5.6 7.1  NEUTROABS  --   --   --   --   --   --   --   --  6.1  HGB 10.2* 10.1* 10.4*   < > 10.9* 11.2* 11.2* 10.3* 10.6*  HCT 32.7* 33.1* 34.1*   < > 32.0* 33.0* 33.0* 33.3* 34.1*  MCV 107.9* 107.8* 107.2*  --   --   --   --  108.5* 105.9*  PLT 113* 107* 103*  --   --   --   --  91* 120*   < > = values in this interval not displayed.    Basic Metabolic Panel: Recent Labs  Lab 09/05/23 0439 09/06/23 0440 09/07/23 0522 09/07/23 1005 09/07/23 1009 09/07/23 1011 09/07/23 1015 09/08/23 0523 09/11/23 1219  NA 139 137 136   < > 139 135 138 137 140  K 4.4 4.5 4.3   < > 4.5 4.4 4.5 4.6 4.8  CL 104 102 101  --   --   --   --  103 104  CO2 28 27 27   --   --   --   --  28 23  GLUCOSE 138* 126* 125*  --   --   --   --  127* 161*  BUN 54* 68* 73*  --   --   --   --  76* 88*  CREATININE 1.91* 2.13* 1.92*  --   --   --   --  2.20* 2.38*  CALCIUM  8.6* 9.0 8.8*  --   --   --   --  9.0 9.6   < > = values in this interval not displayed.    GFR: Estimated Creatinine Clearance: 29.9 mL/min (A) (by C-G formula based on SCr of 2.38 mg/dL (H)).  Liver Function Tests: Recent Labs  Lab 09/05/23 0439  AST 18  ALT 15  ALKPHOS 46  BILITOT 0.8  PROT 5.3*  ALBUMIN  3.2*    Urine analysis:    Component Value Date/Time   COLORURINE YELLOW 12/26/2018 1343   APPEARANCEUR HAZY (A) 12/26/2018 1343   LABSPEC 1.011 12/26/2018 1343   PHURINE 8.0 12/26/2018 1343   GLUCOSEU NEGATIVE 12/26/2018 1343   HGBUR NEGATIVE 12/26/2018 1343   BILIRUBINUR NEGATIVE 12/26/2018 1343   KETONESUR NEGATIVE 12/26/2018 1343   PROTEINUR 100 (A) 12/26/2018 1343   UROBILINOGEN 0.2 08/31/2014 1158   NITRITE NEGATIVE 12/26/2018 1343   LEUKOCYTESUR LARGE (A)  12/26/2018 1343    Radiological Exams on Admission: DG Chest 2 View Result Date: 09/11/2023 CLINICAL DATA:  Shortness of breath EXAM: CHEST - 2 VIEW COMPARISON:  Sep 03, 2023 FINDINGS: Persistent bilateral pulmonary infiltrates correlate with bilateral congestive changes mild cardiomegaly correlate with CHF. No change in the left subclavian pacemaker device IMPRESSION: Mild CHF Electronically Signed   By: Fredrich Jefferson M.D.   On: 09/11/2023 11:32   EKG: Independently reviewed.  Ventricular paced rhythm at 50 bpm.  Nonspecific T wave changes.  Assessment/Plan Principal Problem:   Acute respiratory failure with hypoxia [facility]) Active Problems:   Diastolic dysfunction, grade 2, EF 65-70% May 2013   Acute on chronic diastolic CHF (congestive heart failure) (HCC)   Chronic kidney disease, stage 3b (HCC)   Paroxysmal atrial fibrillation (HCC)   Presence of permanent cardiac pacemaker  Essential hypertension   Anemia   History of DVT (deep vein thrombosis)   COPD (chronic obstructive pulmonary disease) (HCC)   Type 2 diabetes mellitus with stage 3b chronic kidney disease, with long-term current use of insulin  (HCC)   Hypothyroidism   Mixed diabetic hyperlipidemia associated with type 2 diabetes mellitus (HCC)   Obstructive sleep apnea   Morbid obesity with BMI of 50.0-59.9, adult (HCC)   Presence of IVC filter, placed June 2011 after 3d PE   Thrombocytopenia (HCC)   Symptomatic advanced heart block   Obesity hypoventilation syndrome (HCC)   WHO group 3 pulmonary arterial hypertension (HCC)   Acute respite failure hypoxia ?  Acute on chronic diastolic CHF Pulmonary hypertension type III OSA OHS > As per HPI patient admitted for shortness of breath underwent diuresis with minimal improvement recently.  Right heart cath showed normal pulmonary capillary wedge pressure and no further IV diuresis was pursued. > Patient did speak with palliative last admission and is DNR/DNI.  Her disease  states are likely approaching end-stage. > Plan was to see outpatient did on her home diuresis and if she had a significant decline a trial of high-dose diuresis would be appropriate. > Recommendation was for her to discharge to SNF but she chose to return home with home health so she can care for her pets.  Unable to care for her pets at home with her shortness of breath and has arranged care for them and willing to go to SNF.   > Supplemental oxygen was prescribed for patient to use with exertion and she has been using this but has already run out. > Worsening shortness of breath since returning home.  Found to be hypoxic to 85% on room air by EMS.  Saturating well on 2 L now. > Edema on exam.  Evidence of mild CHF on chest x-ray.  BNP 598 which is increased from when she was admitted previously.  Received 40 mg IV Lasix  in the ED. - Monitor in progressive unit overnight - Start with 80 mg IV Lasix  twice daily - Strict I's and O's, daily weights (discharge weight was 135.1 kg) - No role for repeat echocardiogram - Check magnesium  - Trend renal function and electrolytes - CPAP nightly - TOC for SNF  Hypertension - Continue amlodipine  - Diuresis as above  Hyperlipidemia - Continue home rosuvastatin   Anxiety - Continue home Celexa  and Valium   Diabetes - 15 units daily - SSI  Hypothyroidism - Continue home Synthroid   CAD - Continue home rosuvastatin , Eliquis  Complete heart block Paroxysmal atrial fibrillation Status post pacemaker - Paced rhythm in the ED - Continue home Eliquis  History of PE History of DVT Status post IVC filter > 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  IVC filter remains in place.  Had subtherapeutic INR.  Transitioned to Eliquis.  Has had a few doses. - Continue home Eliquis  Anemia Thrombocytopenia > Stable with hemoglobin of 10.6 and platelets of 120 in the ED. - Trend CBC  DVT prophylaxis: Eliquis Code Status:   DNR/DNI Family Communication:   None on admission  Disposition Plan:   Patient is from:  Home  Anticipated DC to:  Likely SNF  Anticipated DC date:  1 to 3 days  Anticipated DC barriers: None  Consults called:  None Admission status:  Observation, telemetry  Severity of Illness: The appropriate patient status for this patient is OBSERVATION. Observation status is judged to be reasonable and necessary in order to provide the required intensity of  service to ensure the patient's safety. The patient's presenting symptoms, physical exam findings, and initial radiographic and laboratory data in the context of their medical condition is felt to place them at decreased risk for further clinical deterioration. Furthermore, it is anticipated that the patient will be medically stable for discharge from the hospital within 2 midnights of admission.    Johnetta Nab MD [facility]  How to contact the [facility] Attending or Consulting provider 7A - 7P or covering provider during after hours 7P -7A, for this patient?   Check the care team in [facility] and look for a) attending/consulting [facility] provider listed and b) the [facility] team listed Log into [email or web address] and use [place]'s universal password to access. If you do not have the password, please contact the hospital operator. Locate the [facility] provider you are looking for under [facility] and page to a number that you can be directly reached. If you still have difficulty reaching the provider, please page the [facility] (Director on Call) for the Hospitalists listed on amion for assistance.  09/11/2023, 2:37 PM

## 2023-09-11 NOTE — ED Triage Notes (Signed)
 Pt BIB [facility] from home for shortness of breath. EMS said when they got to the patient resident he had the canula in her nose the tank was empty. Patient said she has been feeling like this since she came out the hospital May 29. Patient initial oxygen was 85% on room air. She was placed on  4 Liters and oxygen increase to 100. EMS put her on 2L satting at 100. She was having mild dyspnea and rales in her lower lungs. EMS reported she was hypotensive 105/82. She does have a pacemaker her rhythm 50.  Patient told EMS she was on coumadin  and her cardiologist change her to Eliquis on June 3rd. She took 2 dose and have not taken the one due for this morning, because she kept coughing. History of CHF. She does have edema generalized. Patient is A & O x4.

## 2023-09-12 ENCOUNTER — Encounter (HOSPITAL_COMMUNITY): Payer: Self-pay | Admitting: Internal Medicine

## 2023-09-12 DIAGNOSIS — Z95828 Presence of other vascular implants and grafts: Secondary | ICD-10-CM | POA: Diagnosis not present

## 2023-09-12 DIAGNOSIS — I130 Hypertensive heart and chronic kidney disease with heart failure and stage 1 through stage 4 chronic kidney disease, or unspecified chronic kidney disease: Secondary | ICD-10-CM | POA: Diagnosis present

## 2023-09-12 DIAGNOSIS — I442 Atrioventricular block, complete: Secondary | ICD-10-CM | POA: Diagnosis present

## 2023-09-12 DIAGNOSIS — E1169 Type 2 diabetes mellitus with other specified complication: Secondary | ICD-10-CM | POA: Diagnosis present

## 2023-09-12 DIAGNOSIS — I2721 Secondary pulmonary arterial hypertension: Secondary | ICD-10-CM | POA: Diagnosis present

## 2023-09-12 DIAGNOSIS — Z6841 Body Mass Index (BMI) 40.0 and over, adult: Secondary | ICD-10-CM | POA: Diagnosis not present

## 2023-09-12 DIAGNOSIS — D696 Thrombocytopenia, unspecified: Secondary | ICD-10-CM | POA: Diagnosis present

## 2023-09-12 DIAGNOSIS — I4821 Permanent atrial fibrillation: Secondary | ICD-10-CM | POA: Diagnosis present

## 2023-09-12 DIAGNOSIS — Z794 Long term (current) use of insulin: Secondary | ICD-10-CM | POA: Diagnosis not present

## 2023-09-12 DIAGNOSIS — N1832 Chronic kidney disease, stage 3b: Secondary | ICD-10-CM | POA: Diagnosis present

## 2023-09-12 DIAGNOSIS — Z66 Do not resuscitate: Secondary | ICD-10-CM | POA: Diagnosis present

## 2023-09-12 DIAGNOSIS — I5033 Acute on chronic diastolic (congestive) heart failure: Secondary | ICD-10-CM | POA: Diagnosis present

## 2023-09-12 DIAGNOSIS — E1122 Type 2 diabetes mellitus with diabetic chronic kidney disease: Secondary | ICD-10-CM | POA: Diagnosis present

## 2023-09-12 DIAGNOSIS — Z7901 Long term (current) use of anticoagulants: Secondary | ICD-10-CM | POA: Diagnosis not present

## 2023-09-12 DIAGNOSIS — E662 Morbid (severe) obesity with alveolar hypoventilation: Secondary | ICD-10-CM | POA: Diagnosis present

## 2023-09-12 DIAGNOSIS — Z79899 Other long term (current) drug therapy: Secondary | ICD-10-CM | POA: Diagnosis not present

## 2023-09-12 DIAGNOSIS — J9621 Acute and chronic respiratory failure with hypoxia: Secondary | ICD-10-CM | POA: Diagnosis present

## 2023-09-12 DIAGNOSIS — E039 Hypothyroidism, unspecified: Secondary | ICD-10-CM | POA: Diagnosis present

## 2023-09-12 DIAGNOSIS — N179 Acute kidney failure, unspecified: Secondary | ICD-10-CM | POA: Diagnosis present

## 2023-09-12 DIAGNOSIS — E782 Mixed hyperlipidemia: Secondary | ICD-10-CM | POA: Diagnosis present

## 2023-09-12 DIAGNOSIS — I2510 Atherosclerotic heart disease of native coronary artery without angina pectoris: Secondary | ICD-10-CM | POA: Diagnosis present

## 2023-09-12 DIAGNOSIS — J9601 Acute respiratory failure with hypoxia: Secondary | ICD-10-CM | POA: Diagnosis present

## 2023-09-12 DIAGNOSIS — J449 Chronic obstructive pulmonary disease, unspecified: Secondary | ICD-10-CM | POA: Diagnosis present

## 2023-09-12 DIAGNOSIS — Z1152 Encounter for screening for COVID-19: Secondary | ICD-10-CM | POA: Diagnosis not present

## 2023-09-12 DIAGNOSIS — Z79890 Hormone replacement therapy (postmenopausal): Secondary | ICD-10-CM | POA: Diagnosis not present

## 2023-09-12 LAB — COMPREHENSIVE METABOLIC PANEL WITH GFR
ALT: 20 U/L (ref 0–44)
AST: 22 U/L (ref 15–41)
Albumin: 3.4 g/dL — ABNORMAL LOW (ref 3.5–5.0)
Alkaline Phosphatase: 49 U/L (ref 38–126)
Anion gap: 8 (ref 5–15)
BUN: 89 mg/dL — ABNORMAL HIGH (ref 8–23)
CO2: 26 mmol/L (ref 22–32)
Calcium: 9 mg/dL (ref 8.9–10.3)
Chloride: 105 mmol/L (ref 98–111)
Creatinine, Ser: 2.45 mg/dL — ABNORMAL HIGH (ref 0.44–1.00)
GFR, Estimated: 21 mL/min — ABNORMAL LOW (ref >60)
Glucose, Bld: 116 mg/dL — ABNORMAL HIGH (ref 70–99)
Potassium: 4.4 mmol/L (ref 3.5–5.1)
Sodium: 139 mmol/L (ref 135–145)
Total Bilirubin: 0.8 mg/dL (ref 0.0–1.2)
Total Protein: 5.4 g/dL — ABNORMAL LOW (ref 6.5–8.1)

## 2023-09-12 LAB — GLUCOSE, CAPILLARY
Glucose-Capillary: 148 mg/dL — ABNORMAL HIGH (ref 70–99)
Glucose-Capillary: 146 mg/dL — ABNORMAL HIGH (ref 70–99)

## 2023-09-12 LAB — CBC
HCT: 30.4 % — ABNORMAL LOW (ref 36.0–46.0)
Hemoglobin: 9.1 g/dL — ABNORMAL LOW (ref 12.0–15.0)
MCH: 32.3 pg (ref 26.0–34.0)
MCHC: 29.9 g/dL — ABNORMAL LOW (ref 30.0–36.0)
MCV: 107.8 fL — ABNORMAL HIGH (ref 80.0–100.0)
Platelets: 85 10*3/uL — ABNORMAL LOW (ref 150–400)
RBC: 2.82 MIL/uL — ABNORMAL LOW (ref 3.87–5.11)
RDW: 14.7 % (ref 11.5–15.5)
WBC: 6.1 10*3/uL (ref 4.0–10.5)
nRBC: 0 % (ref 0.0–0.2)

## 2023-09-12 LAB — CBG MONITORING, ED
Glucose-Capillary: 133 mg/dL — ABNORMAL HIGH (ref 70–99)
Glucose-Capillary: 153 mg/dL — ABNORMAL HIGH (ref 70–99)

## 2023-09-12 MED ORDER — FUROSEMIDE 10 MG/ML IJ SOLN
40.0000 mg | Freq: Two times a day (BID) | INTRAMUSCULAR | Status: DC
  Administered 2023-09-12 – 2023-09-13 (×2): 40 mg via INTRAVENOUS
  Filled 2023-09-12 (×2): qty 4

## 2023-09-12 NOTE — Care Management Obs Status (Signed)
 MEDICARE OBSERVATION STATUS NOTIFICATION   Patient Details  Name: [name] MRN: [id number] Date of Birth: 1956-02-06   Medicare Observation Status Notification Given:  Yes    Ronni Colace, RN 09/12/2023, 9:21 AM

## 2023-09-12 NOTE — ED Notes (Signed)
 PT at bedside.

## 2023-09-12 NOTE — ED Notes (Signed)
 Patient ambulated to the restroom with heavy assist and brought back to room in the wheelchair.

## 2023-09-12 NOTE — Progress Notes (Addendum)
 PROGRESS NOTE    [name]  ZOX:[id number] DOB: Oct 18, 1955 DOA: 09/11/2023 PCP: Edda Goo, MD   67/F morbidly obese with CAD, chronic diastolic CHF, pulmonary hypertension, history of PE, OSA/OHS-intolerant to CPAP, DVT, IVC filter, paroxysmal A-fib, thrombocytopenia, chronic anemia recently hospitalized with hypoxia, CHF exacerbation. - Right and left heart cath close to discharge noted improved wedge pressure, pulmonary hypertension,she reports mild improvement with diuresis, and eventually discharged home on O2, declined rehab at the time of discharge, was also seen by palliative care, now DNR with plan for outpatient palliative care, in addition was set up with home O2 at discharge, allegedly ran out of this a few days later at home - Presented to the ED with worsening dyspnea for 1 to 2 days, some edema, reports compliance with diuretics, when EMS arrived noted to be hypoxic, 85%, placed on 4 L O2 -In the ER hypertensive, mildly hypoxic, creatinine 2.3, hemoglobin 10.6, platelets 120, BNP 598, chest x-ray with mild CHF  Subjective: -Reports mild dyspnea, little better from yesterday, now considering rehab  Assessment and Plan:  Acute respite failure hypoxia Acute on chronic diastolic CHF Pulmonary hypertension type III OSA/OHS - Recent echo noted EF 60-65%, moderate LVH, normal RV -Recent R/LHC noted no significant CAD, PA pressure 62/16, wedge of 11, PVR 4.4, group 3 pulmonary hypertension suspected -Suspect respiratory failure is multifactorial, some component of volume overload, in addition to untreated OSA/OHS is contributing and also has been allegedly without home O2 -Continue attempts at diuresis today, Lasix  80 Mg twice daily transition to oral torsemide  40 Mg twice daily at discharge -GDMT limited by CKD 4 -Seen by palliative care last admission, now DNR, plan for outpatient palliative care follow-up - PT OT eval, TOC consult, now interested in  rehab  OSA/OHS -Intolerant to CPAP  History of DVT/PE History of IVC filter -Continue Eliquis   AKI/CKD 3b - Baseline creatinine around 1.6-2, now slightly higher at 2.3 -Likely cardiorenal, monitor with diuresis   Hypertension - Continue amlodipine  - Diuresis as above   Hyperlipidemia - Continue statin   Anxiety - Continue Celexa  and Valium    DM2 -*Stable, continue current regimen of insulin    Hypothyroidism - Continue home Synthroid    CAD - Continue home rosuvastatin , Eliquis    Complete heart block Paroxysmal atrial fibrillation Status post pacemaker - Paced rhythm in the ED - Continue home Eliquis     Anemia Thrombocytopenia - Relatively stable   DVT prophylaxis:      Eliquis  Code Status:              DNR/DNI Family Communication:       None on admission  Disposition Plan: Likely SNF for short-term rehab =  Consultants:    Procedures:   Antimicrobials:    Objective: Vitals:   09/12/23 0039 09/12/23 0712 09/12/23 0845 09/12/23 1100  BP: (!) 145/63 (!) 146/56 (!) 145/53   Pulse: 62 (!) 50 (!) 55   Resp: 15  16   Temp: 98.8 F (37.1 C) 98 F (36.7 C)  97.7 F (36.5 C)  TempSrc: Oral Oral  Oral  SpO2: 100% 100% 100%    No intake or output data in the 24 hours ending 09/12/23 1131 There were no vitals filed for this visit.  Examination:  General exam: Obese chronically ill female laying in bed, AAO x 3 HEENT: Neck obese unable to assess JVD CVS: S1-S2, regular rhythm Lungs: Decreased breath sounds otherwise clear Abdomen: Soft, nontender, bowel sounds present Ext: 1-2+ edema with chronic skin  changes and hyperpigmentation Skin: as above Psychiatry:  Mood & affect appropriate.     Data Reviewed:   CBC: Recent Labs  Lab 09/06/23 0440 09/07/23 0522 09/07/23 1005 09/07/23 1011 09/07/23 1015 09/08/23 0523 09/11/23 1219 09/12/23 0552  WBC 5.7 5.7  --   --   --  5.6 7.1 6.1  NEUTROABS  --   --   --   --   --   --  6.1  --   HGB  10.1* 10.4*   < > 11.2* 11.2* 10.3* 10.6* 9.1*  HCT 33.1* 34.1*   < > 33.0* 33.0* 33.3* 34.1* 30.4*  MCV 107.8* 107.2*  --   --   --  108.5* 105.9* 107.8*  PLT 107* 103*  --   --   --  91* 120* 85*   < > = values in this interval not displayed.   Basic Metabolic Panel: Recent Labs  Lab 09/06/23 0440 09/07/23 0522 09/07/23 1005 09/07/23 1011 09/07/23 1015 09/08/23 0523 09/11/23 1219 09/12/23 0552  NA 137 136   < > 135 138 137 140 139  K 4.5 4.3   < > 4.4 4.5 4.6 4.8 4.4  CL 102 101  --   --   --  103 104 105  CO2 27 27  --   --   --  28 23 26   GLUCOSE 126* 125*  --   --   --  127* 161* 116*  BUN 68* 73*  --   --   --  76* 88* 89*  CREATININE 2.13* 1.92*  --   --   --  2.20* 2.38* 2.45*  CALCIUM  9.0 8.8*  --   --   --  9.0 9.6 9.0  MG  --   --   --   --   --   --  2.5*  --    < > = values in this interval not displayed.   GFR: Estimated Creatinine Clearance: 29.1 mL/min (A) (by C-G formula based on SCr of 2.45 mg/dL (H)). Liver Function Tests: Recent Labs  Lab 09/12/23 0552  AST 22  ALT 20  ALKPHOS 49  BILITOT 0.8  PROT 5.4*  ALBUMIN  3.4*   No results for input(s): "LIPASE", "AMYLASE" in the last 168 hours. No results for input(s): "AMMONIA" in the last 168 hours. Coagulation Profile: Recent Labs  Lab 09/06/23 0440 09/07/23 0522  INR 1.4* 1.3*   Cardiac Enzymes: No results for input(s): "CKTOTAL", "CKMB", "CKMBINDEX", "TROPONINI" in the last 168 hours. BNP (last 3 results) No results for input(s): "PROBNP" in the last 8760 hours. HbA1C: No results for input(s): "HGBA1C" in the last 72 hours. CBG: Recent Labs  Lab 09/07/23 2100 09/08/23 0757 09/08/23 1206 09/11/23 1727 09/12/23 0835  GLUCAP 139* 185* 112* 135* 133*   Lipid Profile: No results for input(s): "CHOL", "HDL", "LDLCALC", "TRIG", "CHOLHDL", "LDLDIRECT" in the last 72 hours. Thyroid Function Tests: No results for input(s): "TSH", "T4TOTAL", "FREET4", "T3FREE", "THYROIDAB" in the last 72  hours. Anemia Panel: No results for input(s): "VITAMINB12", "FOLATE", "FERRITIN", "TIBC", "IRON ", "RETICCTPCT" in the last 72 hours. Urine analysis:    Component Value Date/Time   COLORURINE YELLOW 12/26/2018 1343   APPEARANCEUR HAZY (A) 12/26/2018 1343   LABSPEC 1.011 12/26/2018 1343   PHURINE 8.0 12/26/2018 1343   GLUCOSEU NEGATIVE 12/26/2018 1343   HGBUR NEGATIVE 12/26/2018 1343   BILIRUBINUR NEGATIVE 12/26/2018 1343   KETONESUR NEGATIVE 12/26/2018 1343   PROTEINUR 100 (A) 12/26/2018 1343  UROBILINOGEN 0.2 08/31/2014 1158   NITRITE NEGATIVE 12/26/2018 1343   LEUKOCYTESUR LARGE (A) 12/26/2018 1343   Sepsis Labs: @LABRCNTIP (procalcitonin:4,lacticidven:4)  ) Recent Results (from the past 240 hours)  Resp panel by RT-PCR (RSV, Flu A&B, Covid) Anterior Nasal Swab     Status: None   Collection Time: 09/03/23  9:41 AM   Specimen: Anterior Nasal Swab  Result Value Ref Range Status   SARS Coronavirus 2 by RT PCR NEGATIVE NEGATIVE Final   Influenza A by PCR NEGATIVE NEGATIVE Final   Influenza B by PCR NEGATIVE NEGATIVE Final    Comment: (NOTE) The Xpert Xpress SARS-CoV-2/FLU/RSV plus assay is intended as an aid in the diagnosis of influenza from Nasopharyngeal swab specimens and should not be used as a sole basis for treatment. Nasal washings and aspirates are unacceptable for Xpert Xpress SARS-CoV-2/FLU/RSV testing.  Fact Sheet for Patients: BloggerCourse.com  Fact Sheet for Healthcare Providers: SeriousBroker.it  This test is not yet approved or cleared by the [place]  FDA and has been authorized for detection and/or diagnosis of SARS-CoV-2 by FDA under an Emergency Use Authorization (EUA). This EUA will remain in effect (meaning this test can be used) for the duration of the COVID-19 declaration under Section 564(b)(1) of the Act, 21 U.S.C. section 360bbb-3(b)(1), unless the authorization is terminated  or revoked.     Resp Syncytial Virus by PCR NEGATIVE NEGATIVE Final    Comment: (NOTE) Fact Sheet for Patients: BloggerCourse.com  Fact Sheet for Healthcare Providers: SeriousBroker.it  This test is not yet approved or cleared by the [place]  FDA and has been authorized for detection and/or diagnosis of SARS-CoV-2 by FDA under an Emergency Use Authorization (EUA). This EUA will remain in effect (meaning this test can be used) for the duration of the COVID-19 declaration under Section 564(b)(1) of the Act, 21 U.S.C. section 360bbb-3(b)(1), unless the authorization is terminated or revoked.  Performed at [facility], [address] [place], [place] [address]      Radiology Studies: DG Chest 2 View Result Date: 09/11/2023 CLINICAL DATA:  Shortness of breath EXAM: CHEST - 2 VIEW COMPARISON:  Sep 03, 2023 FINDINGS: Persistent bilateral pulmonary infiltrates correlate with bilateral congestive changes mild cardiomegaly correlate with CHF. No change in the left subclavian pacemaker device IMPRESSION: Mild CHF Electronically Signed   By: Fredrich Jefferson M.D.   On: 09/11/2023 11:32     Scheduled Meds:  allopurinol   300 mg Oral Daily   amLODipine   5 mg Oral Daily   apixaban   10 mg Oral BID   Followed by   Cecily Cohen ON 09/16/2023] apixaban   5 mg Oral BID   citalopram   20 mg Oral QHS   furosemide   40 mg Intravenous BID   insulin  aspart  0-15 Units Subcutaneous TID WC   insulin  glargine-yfgn  15 Units Subcutaneous Daily   levothyroxine   75 mcg Oral QAC breakfast   melatonin  5 mg Oral QHS   rosuvastatin   10 mg Oral Daily   sodium chloride  flush  3 mL Intravenous Q12H   Continuous Infusions:   LOS: 0 days    Time spent:    Deforest Fast, MD [facility]   09/12/2023, 11:31 AM

## 2023-09-12 NOTE — Evaluation (Signed)
 Physical Therapy Evaluation Patient Details Name: [name] MRN: [id number] DOB: [birth year] Today's Date: 09/12/2023  History of Present Illness  Pt is a 68 y.o. female who presented 09/11/23 with worsening SOB. Admitted for acute respiratory failure hypoxia and ?acute on chronic diastolic CHF. Patient admitted 5/29-6/3 for SOB also. PMH: HTN, afib, heart block s/p PPM, diastolic CHF, DM2 CKD stage IIIb, DVT/PE x 3, s/p IVC filter on Coumadin , hypothyroidism, OSA, HLD, anemia, obesity, pulmonary hypertension type III   Clinical Impression  Pt presents with condition above and deficits mentioned below, see PT Problem List. PTA, she was mod I utilizing a RW vs rollator for functional mobility, and she was living alone in a 1-level house with 3 STE. She has PRN assistance available to her from neighbors. Since recently discharging home on 6/3, she has had increased difficulty getting up, ambulating, and navigating stairs. She is currently displaying deficits in activity tolerance/endurance, balance, and generalized strength. She is at risk for falls. She is currently requiring modA to transition supine to sit, minA to transfer sit to stand, and CGA to ambulate with a RW. She needed x3 standing rest breaks to tolerate ambulating up to ~100 ft today. She needed cues for activity pacing and pursed lip breathing. Based on her limited support at d/c, risk for falls, functional decline, and need for assistance, she would benefit from short-term inpatient rehab, < 3 hours/day, to maximize her safety and return to independence with all functional mobility. Will continue to follow acutely.        If plan is discharge home, recommend the following: Assistance with cooking/housework;Assist for transportation;Help with stairs or ramp for entrance;A little help with walking and/or transfers;A little help with bathing/dressing/bathroom   Can travel by private vehicle   Yes    Equipment Recommendations None  recommended by PT  Recommendations for Other Services  OT consult    Functional Status Assessment Patient has had a recent decline in their functional status and demonstrates the ability to make significant improvements in function in a reasonable and predictable amount of time.     Precautions / Restrictions Precautions Precautions: Fall;Other (comment) Recall of Precautions/Restrictions: Intact Precaution/Restrictions Comments: watch SpO2 Restrictions Weight Bearing Restrictions Per Provider Order: No      Mobility  Bed Mobility Overal bed mobility: Needs Assistance Bed Mobility: Supine to Sit, Sit to Supine     Supine to sit: Mod assist, HOB elevated, Used rails Sit to supine: Contact guard assist, Used rails   General bed mobility comments: Pt required extra time, HOB elevated, and modA to pull up and scoot hips to sit up R edge of stretcher. Pt used rails to return to supine, CGA for safety    Transfers Overall transfer level: Needs assistance Equipment used: Rolling walker (2 wheels) Transfers: Sit to/from Stand Sit to Stand: Min assist           General transfer comment: MinA needed for balance transferring to stand from edge of stretcher    Ambulation/Gait Ambulation/Gait assistance: Contact guard assist Gait Distance (Feet): 100 Feet Assistive device: Rolling walker (2 wheels) Gait Pattern/deviations: Step-through pattern, Decreased stride length, Decreased step length - right, Decreased step length - left, Trunk flexed, Wide base of support Gait velocity: reduced Gait velocity interpretation: <1.31 ft/sec, indicative of household ambulator   General Gait Details: Pt takes slow, small, wide steps with a slightly widened BOS. No LOB, CGA for safety. Cued pt for pursed lip breathing and activity pacing. Pt took x3  standing rest breaks  Stairs            Wheelchair Mobility     Tilt Bed    Modified Rankin (Stroke Patients Only)       Balance  Overall balance assessment: Needs assistance Sitting-balance support: No upper extremity supported, Feet supported Sitting balance-[name]: Fair Sitting balance - Comments: supervision sitting statically EOB   Standing balance support: Bilateral upper extremity supported, During functional activity, Reliant on assistive device for balance Standing balance-[name]: Poor Standing balance comment: reliant on RW                             Pertinent Vitals/Pain Pain Assessment Pain Assessment: Faces Faces Pain Scale: Hurts a little bit Pain Location: back, hips, and knees Pain Descriptors / Indicators: Discomfort, Grimacing, Guarding Pain Intervention(s): Monitored during session, Limited activity within patient's tolerance, Repositioned    Home Living Family/patient expects to be discharged to:: Private residence Living Arrangements: Alone Available Help at Discharge: Neighbor;Available PRN/intermittently Type of Home: House Home Access: Stairs to enter Entrance Stairs-Rails: Left Entrance Stairs-Number of Steps: 3   Home Layout: One level Home Equipment: Shower seat;Hand held shower head;Grab bars - tub/shower;Rollator (4 wheels);Rolling Walker (2 wheels) Additional Comments: recently went home with 2L O2    Prior Function Prior Level of Function : Independent/Modified Independent             Mobility Comments: use RW for ambulation outside and rollator in house, rarely goes out; since recent discharge, needed +2 assist to manage stairs but previously had been able to manage stairs mod I ADLs Comments: mod I for ADLs, groceries delivered, uses [employer]     Extremity/Trunk Assessment   Upper Extremity Assessment Upper Extremity Assessment: Defer to OT evaluation    Lower Extremity Assessment Lower Extremity Assessment: Generalized weakness (noted with functional mobility; reports hx of peripheral neuropathy but denies numbness/tingling; discoloration at  bil lower legs)    Cervical / Trunk Assessment Cervical / Trunk Assessment: Kyphotic  Communication   Communication Communication: No apparent difficulties    Cognition Arousal: Alert Behavior During Therapy: WFL for tasks assessed/performed   PT - Cognitive impairments: No apparent impairments                         Following commands: Intact       Cueing Cueing Techniques: Verbal cues     General Comments General comments (skin integrity, edema, etc.): SpO2 >/= 98% on 1L at rest, on 2-3L when ambulating though (unsure of sats then as box could not come along with her)    Exercises     Assessment/Plan    PT Assessment Patient needs continued PT services  PT Problem List Decreased strength;Decreased activity tolerance;Decreased balance;Decreased mobility;Cardiopulmonary status limiting activity;Impaired sensation;Obesity;Pain       PT Treatment Interventions DME instruction;Gait training;Stair training;Functional mobility training;Therapeutic activities;Therapeutic exercise;Balance training;Patient/family education;Neuromuscular re-education    PT Goals (Current goals can be found in the Care Plan section)  Acute Rehab PT Goals Patient Stated Goal: to get stronger and be independent again PT Goal Formulation: With patient Time For Goal Achievement: 09/26/23 Potential to Achieve Goals: Good    Frequency Min 2X/week     Co-evaluation               AM-PAC PT "6 Clicks" Mobility  Outcome Measure Help needed turning from your back to your side while in  a flat bed without using bedrails?: A Little Help needed moving from lying on your back to sitting on the side of a flat bed without using bedrails?: A Lot Help needed moving to and from a bed to a chair (including a wheelchair)?: A Little Help needed standing up from a chair using your arms (e.g., wheelchair or bedside chair)?: A Little Help needed to walk in hospital room?: A Little Help needed  climbing 3-5 steps with a railing? : Total 6 Click Score: 15    End of Session Equipment Utilized During Treatment: Gait belt;Oxygen Activity Tolerance: Patient tolerated treatment well Patient left: in bed;with call bell/phone within reach Nurse Communication: Mobility status PT Visit Diagnosis: Unsteadiness on feet (R26.81);Muscle weakness (generalized) (M62.81);Difficulty in walking, not elsewhere classified (R26.2);History of falling (Z91.81);Pain;Other abnormalities of gait and mobility (R26.89) Pain - Right/Left:  (bil) Pain - part of body: Knee;Hip (back)    Time: 1346-1410 PT Time Calculation (min) (ACUTE ONLY): 24 min   Charges:   PT Evaluation $PT Eval Moderate Complexity: 1 Mod PT Treatments $Therapeutic Activity: 8-22 mins PT General Charges $$ ACUTE PT VISIT: 1 Visit         Vernida Goodie, PT, DPT Acute Rehabilitation Services  Office: [phone]   Ellyn Hack 09/12/2023, 2:33 PM

## 2023-09-12 NOTE — Care Management (Addendum)
 Transition of Care [facility]) - Inpatient Brief Assessment   Patient Details  Name: [name] MRN: [id number] Date of Birth: 1955-10-01  Transition of Care [facility]) CM/SW Contact:    Ronni Colace, RN Phone Number: 09/12/2023, 9:22 AM   Clinical Narrative:  Patient was transitioned to home on 6/3 with home health and oxygen at 2LPM. [employer] Jermaine from [place]  called as the patient never received the concentrator, so her oxygen ran out. She stated she received no messages or missed calls, voice mails. He will personally reach out to her, however she did state that she  now wished to go to  SNF, as she does not feel she can take care of herself or her pet. She does have someone  who can care for her pet while in SNF  PT consult placed TOC will follow  Transition of Care Asessment: Insurance and Status: Insurance coverage has been reviewed Patient has primary care physician: Yes Home environment has been reviewed: Lives alone with a pet Prior level of function:: Needs assistance, does not feel she can take care of self Prior/Current Home Services: Current home services Social Drivers of Health Review: SDOH reviewed no interventions necessary Readmission risk has been reviewed: Yes Transition of care needs: transition of care needs identified, TOC will continue to follow

## 2023-09-13 DIAGNOSIS — J9601 Acute respiratory failure with hypoxia: Secondary | ICD-10-CM | POA: Diagnosis not present

## 2023-09-13 LAB — GLUCOSE, CAPILLARY
Glucose-Capillary: 109 mg/dL — ABNORMAL HIGH (ref 70–99)
Glucose-Capillary: 170 mg/dL — ABNORMAL HIGH (ref 70–99)
Glucose-Capillary: 128 mg/dL — ABNORMAL HIGH (ref 70–99)
Glucose-Capillary: 137 mg/dL — ABNORMAL HIGH (ref 70–99)

## 2023-09-13 LAB — BASIC METABOLIC PANEL WITH GFR
Anion gap: 9 (ref 5–15)
BUN: 95 mg/dL — ABNORMAL HIGH (ref 8–23)
CO2: 26 mmol/L (ref 22–32)
Calcium: 8.6 mg/dL — ABNORMAL LOW (ref 8.9–10.3)
Chloride: 103 mmol/L (ref 98–111)
Creatinine, Ser: 2.52 mg/dL — ABNORMAL HIGH (ref 0.44–1.00)
GFR, Estimated: 20 mL/min — ABNORMAL LOW (ref >60)
Glucose, Bld: 123 mg/dL — ABNORMAL HIGH (ref 70–99)
Potassium: 4.2 mmol/L (ref 3.5–5.1)
Sodium: 138 mmol/L (ref 135–145)

## 2023-09-13 MED ORDER — TORSEMIDE 20 MG PO TABS
40.0000 mg | ORAL_TABLET | Freq: Two times a day (BID) | ORAL | Status: DC
  Administered 2023-09-13 – 2023-09-16 (×7): 40 mg via ORAL
  Filled 2023-09-13 (×7): qty 2

## 2023-09-13 NOTE — TOC Initial Note (Addendum)
 Transition of Care [facility]) - Initial/Assessment Note    Patient Details  Name: [name] MRN: [id number] Date of Birth: Nov 25, 1955  Transition [facility] CM/SW Contact:    Hilda Lovings, LCSW Phone Number: 09/13/2023, 11:25 AM  Clinical Narrative:                 CSW followed-up disposition recommendations (SNF placement).  CSW completed initial TOC work-up/assessment as noted by the following below.   CSW spoke with the patient to review SNF referral process per clinical recommendations and assessed the pt's SNF preference.   The patient expressed no preference.   The CSW contacted the patient's natural support Jaquelyn Merino [phone]) and reviewed the clinical recommendations and assess SNF preference. The natural support expressed said no preference, just not [place].   CSW referral efforts to support the patient's disposition FL2:  PASRR:  SNF referrals:   TOC Disposition follow-up needs  Please provide the patient or natural support with bed-offer updates.  Please continue with SNF placement efforts. Please update the clinical team to SNF placement efforts:  No other needs identified by this Clinical research associate currently. Patient needs and current disposition to be followed by    Expected Discharge Plan: Skilled Nursing Facility Barriers to Discharge: Continued Medical Work up   Patient Goals and CMS Choice Patient states their goals for this hospitalization and ongoing recovery are:: Will go to a SNF now   Choice offered to / list presented to : Patient      Expected Discharge Plan and Services In-house Referral: Clinical Social Work   Post Acute Care Choice: Skilled Nursing Facility Living arrangements for the past 2 months: Single Family Home                 DME Arranged: Oxygen DME Agency: NA                  Prior Living Arrangements/Services Living arrangements for the past 2 months: Single Family Home Lives with:: Self Patient  language and need for interpreter reviewed:: Yes Do you feel safe going back to the place where you live?: Yes      Need for Family Participation in Patient Care: Yes (Comment) Care giver support system in place?: No (comment) Current home services: DME Criminal Activity/Legal Involvement Pertinent to Current Situation/Hospitalization: No - Comment as needed  Activities of Daily Living   ADL Screening (condition at time of admission) Independently performs ADLs?: Yes (appropriate for developmental age) Is the patient deaf or have difficulty hearing?: No Does the patient have difficulty seeing, even when wearing glasses/contacts?: No Does the patient have difficulty concentrating, remembering, or making decisions?: No  Permission Sought/Granted Permission sought to share information with : Case Manager, Magazine features editor, Family Supports Permission granted to share information with : Yes, Verbal Permission Granted  Share Information with NAME: : Friend Brown,Vicki     Permission granted to share info w Relationship: Friend  Permission granted to share info w Contact Information: Yes: Pt Contact person: Jaquelyn Merino  [phone]  Emotional Assessment       Orientation: : Oriented to Self, Oriented to Place, Oriented to  Time, Oriented to Situation Alcohol / Substance Use: Not Applicable Psych Involvement: No (comment)  Admission diagnosis:  Acute respiratory failure with hypoxia (HCC) [J96.01] Acute on chronic respiratory failure with hypoxia (HCC) [J96.21] Patient Active Problem List   Diagnosis Date Noted   Acute respiratory failure with hypoxia (HCC) 09/11/2023   Obesity hypoventilation  syndrome (HCC) 09/07/2023   WHO group 3 pulmonary arterial hypertension (HCC) 09/07/2023   (HFpEF) heart failure with preserved ejection fraction (HCC) 09/03/2023   Elevated troponin 09/03/2023   Subtherapeutic international normalized ratio (INR) 09/03/2023   Presence of  permanent cardiac pacemaker 09/03/2023   History of DVT (deep vein thrombosis) 09/03/2023   Pain in left hip 01/07/2023   Pain in right knee 01/07/2023   Type 2 diabetes mellitus with stage 3b chronic kidney disease, with long-term current use of insulin  (HCC) 09/09/2021   Mixed diabetic hyperlipidemia associated with type 2 diabetes mellitus (HCC) 09/09/2021   Hypothyroidism 09/09/2021   Wound of left leg, initial encounter 09/09/2021   C6 cervical fracture (HCC) 09/01/2018   Atrial flutter (HCC) 09/01/2018   Paroxysmal atrial fibrillation (HCC) 09/01/2018   Syncope 08/29/2018   Symptomatic advanced heart block 08/29/2018   Acute on chronic diastolic CHF (congestive heart failure) (HCC)    COPD (chronic obstructive pulmonary disease) (HCC) 09/12/2015   Personal history of endocrine, metabolic or immunity disorder 91/47/8295   Decreased mobility 04/03/2015   Deep vein thrombosis (DVT) of both lower extremities (HCC) 02/24/2015   Chronic kidney disease, stage 3b (HCC) 02/24/2015   Long term current use of anticoagulant therapy 11/19/2014   Osteoarthritis 08/31/2014   Cellulitis of hand, right 08/31/2014   Tremors  05/07/2012   Obstructive sleep apnea 05/03/2012   Thrombocytopenia (HCC) 08/18/2011   Diastolic dysfunction, grade 2, EF 65-70% May 2013 08/14/2011   LBBB (left bundle branch block) 08/14/2011   Angina pectoris (HCC) 08/13/2011   Morbid obesity with BMI of 50.0-59.9, adult (HCC) 08/13/2011   Essential hypertension 08/13/2011   Anemia 08/13/2011   History of pulmonary embolism, recurrent, 3 seperate episodes. 08/13/2011   Presence of IVC filter, placed June 2011 after 3d PE 08/13/2011   Pulmonary HTN, Moderate  PA pressure 09/2009 08/13/2011   PCP:  Edda Goo, MD Pharmacy:   [phone] - [place], [place] - [address] AT [facility] [address] [place] [address] Phone: [phone] Fax: [phone]  [facility] [address] [place] [address] Phone: [phone] Fax: [phone]     Social Drivers of Health (SDOH) Social History: SDOH Screenings   Food Insecurity: No Food Insecurity (09/12/2023)  Housing: Low Risk  (09/12/2023)  Transportation Needs: No Transportation Needs (09/12/2023)  Utilities: Not At Risk (09/12/2023)  Social Connections: Socially Isolated (09/12/2023)  Tobacco Use: Medium Risk (09/12/2023)   SDOH Interventions:     Readmission Risk Interventions     No data to display

## 2023-09-13 NOTE — Evaluation (Signed)
 Occupational Therapy Evaluation Patient Details Name: [name] MRN: [id number] DOB: 1955/06/10 Today's Date: 09/13/2023   History of Present Illness   Pt is a 68 y.o. female who presented 09/11/23 with worsening SOB. Admitted for acute respiratory failure hypoxia and ?acute on chronic diastolic CHF. Patient admitted 5/29-6/3 for SOB also. PMH: HTN, afib, heart block s/p PPM, diastolic CHF, DM2 CKD stage IIIb, DVT/PE x 3, s/p IVC filter on Coumadin , hypothyroidism, OSA, HLD, anemia, obesity, pulmonary hypertension type III     Clinical Impressions PTA, pt lived alone and recently home from last admission reporting it has been difficult to care for herself since. Upon eval, pt requires min A for bed mobility, max A for LB ADL, and CGA-min A for transfers with RW. Pt able to stand at sink for oral care hunched over onto elbows and limited to one task and return directly to bed due to pain in L hip. SPO>90 throughout. Will continue to follow. Patient will benefit from continued inpatient follow up therapy, <3 hours/day      If plan is discharge home, recommend the following:   A little help with walking and/or transfers;A little help with bathing/dressing/bathroom;Assistance with cooking/housework     Functional Status Assessment   Patient has had a recent decline in their functional status and demonstrates the ability to make significant improvements in function in a reasonable and predictable amount of time.     Equipment Recommendations   None recommended by OT     Recommendations for Other Services         Precautions/Restrictions   Precautions Precautions: Fall;Other (comment) Recall of Precautions/Restrictions: Intact Precaution/Restrictions Comments: watch SpO2 Restrictions Weight Bearing Restrictions Per Provider Order: No     Mobility Bed Mobility Overal bed mobility: Needs Assistance Bed Mobility: Supine to Sit, Sit to Supine     Supine to sit: HOB  elevated, Used rails, Min assist Sit to supine: Contact guard assist, Used rails        Transfers Overall transfer level: Needs assistance Equipment used: Rolling walker (2 wheels) Transfers: Sit to/from Stand Sit to Stand: Contact guard assist                  Balance Overall balance assessment: Needs assistance Sitting-balance support: No upper extremity supported, Feet supported Sitting balance-[name]: Fair Sitting balance - Comments: supervision sitting statically EOB   Standing balance support: Bilateral upper extremity supported, During functional activity, Reliant on assistive device for balance Standing balance-[name]: Poor Standing balance comment: reliant on RW                           ADL either performed or assessed with clinical judgement   ADL Overall ADL's : Needs assistance/impaired Eating/Feeding: Independent   Grooming: Contact guard assist;Standing Grooming Details (indicate cue type and reason): BUE elbow support on sink             Lower Body Dressing: Maximal assistance;Sitting/lateral leans Lower Body Dressing Details (indicate cue type and reason): for socks Toilet Transfer: Ambulation;Rolling walker (2 wheels);Contact guard assist           Functional mobility during ADLs: Rolling walker (2 wheels);Contact guard assist General ADL Comments: CGA for safety     Vision         Perception         Praxis         Pertinent Vitals/Pain Pain Assessment Pain Assessment: Faces Faces Pain  Scale: Hurts a little bit Pain Location: back, hips, and knees Pain Descriptors / Indicators: Discomfort, Grimacing, Guarding Pain Intervention(s): Limited activity within patient's tolerance     Extremity/Trunk Assessment Upper Extremity Assessment Upper Extremity Assessment: Generalized weakness   Lower Extremity Assessment Lower Extremity Assessment: Defer to PT evaluation   Cervical / Trunk Assessment Cervical  / Trunk Assessment: Kyphotic (habitus)   Communication Communication Communication: No apparent difficulties   Cognition Arousal: Alert Behavior During Therapy: WFL for tasks assessed/performed Cognition: No apparent impairments                               Following commands: Intact       Cueing  General Comments   Cueing Techniques: Verbal cues      Exercises     Shoulder Instructions      Home Living Family/patient expects to be discharged to:: Private residence Living Arrangements: Alone Available Help at Discharge: Neighbor;Available PRN/intermittently Type of Home: House Home Access: Stairs to enter [employer] of Steps: 3 Entrance Stairs-Rails: Left Home Layout: One level     Bathroom Shower/Tub: Producer, television/film/video:  (comfort)     Home Equipment: Shower seat;Hand held shower head;Grab bars - tub/shower;Rollator (4 wheels);Rolling Walker (2 wheels)   Additional Comments: recently went home with 2L O2      Prior Functioning/Environment Prior Level of Function : Independent/Modified Independent             Mobility Comments: use RW for ambulation outside and rollator in house, rarely goes out; since recent discharge, needed +2 assist to manage stairs but previously had been able to manage stairs mod I ADLs Comments: mod I for ADLs, groceries delivered, uses [employer]    OT Problem List: Decreased activity tolerance;Impaired balance (sitting and/or standing);Decreased safety awareness;Cardiopulmonary status limiting activity;Decreased knowledge of precautions   OT Treatment/Interventions: Energy conservation;DME and/or AE instruction;Therapeutic activities;Patient/family education;Balance training;Self-care/ADL training;Therapeutic exercise      OT Goals(Current goals can be found in the care plan section)   Acute Rehab OT Goals Patient Stated Goal: get better OT Goal Formulation: With patient Time For Goal  Achievement: 09/27/23 Potential to Achieve Goals: Good   OT Frequency:  Min 2X/week    Co-evaluation              AM-PAC OT "6 Clicks" Daily Activity     Outcome Measure Help from another person eating meals?: None Help from another person taking care of personal grooming?: A Little Help from another person toileting, which includes using toliet, bedpan, or urinal?: A Lot Help from another person bathing (including washing, rinsing, drying)?: A Lot Help from another person to put on and taking off regular upper body clothing?: A Little Help from another person to put on and taking off regular lower body clothing?: A Lot 6 Click Score: 16   End of Session Equipment Utilized During Treatment: Rolling walker (2 wheels);Oxygen Nurse Communication: Mobility status  Activity Tolerance: Patient tolerated treatment well;Patient limited by fatigue Patient left: in bed;with call bell/phone within reach;with bed alarm set  OT Visit Diagnosis: Other abnormalities of gait and mobility (R26.89);Muscle weakness (generalized) (M62.81)                Time: 1478-2956 OT Time Calculation (min): 26 min Charges:  OT General Charges $OT Visit: 1 Visit OT Evaluation $OT Eval Moderate Complexity: 1 Mod OT Treatments $Self Care/Home Management : 8-22 mins  Laurence Folz D Walton, OTD, OTR/L [facility] Acute Rehabilitation Office: [phone]   Emery Hans 09/13/2023, 5:30 PM

## 2023-09-13 NOTE — Progress Notes (Signed)
 PROGRESS NOTE    [name]  ZOX:[id number] DOB: 01-26-1956 DOA: 09/11/2023 PCP: Edda Goo, MD   67/F morbidly obese with CAD, chronic diastolic CHF, pulmonary hypertension, history of PE, OSA/OHS-intolerant to CPAP, DVT, IVC filter, paroxysmal A-fib, thrombocytopenia, chronic anemia recently hospitalized with hypoxia, CHF exacerbation. - Right and left heart cath close to discharge noted improved wedge pressure, pulmonary hypertension,she reports mild improvement with diuresis, and eventually discharged home on O2, declined rehab at the time of discharge, was also seen by palliative care, now DNR with plan for outpatient palliative care, in addition was set up with home O2 at discharge, allegedly ran out of this a few days later at home, also some confusion about torsemide , may not have taken it after discharge - Presented to the ED with worsening dyspnea for 1 to 2 days, some edema, reports compliance with diuretics, when EMS arrived noted to be hypoxic, 85%, placed on 4 L O2 -In the ER hypertensive, mildly hypoxic, creatinine 2.3, hemoglobin 10.6, platelets 120, BNP 598, chest x-ray with mild CHF  Subjective: - Feels better, breathing closer to baseline  Assessment and Plan:  Acute respite failure hypoxia Acute on chronic diastolic CHF Pulmonary hypertension type III OSA/OHS - Recent echo noted EF 60-65%, moderate LVH, normal RV -Recent R/LHC noted no significant CAD, PA pressure 62/16, wedge of 11, PVR 4.4, group 3 pulmonary hypertension suspected -Suspect respiratory failure is multifactorial, some component of volume overload, in addition to untreated OSA/OHS is contributing and also has been allegedly without home O2 - Volume status is improving, will transition to oral torsemide  40 Mg twice daily -GDMT limited by CKD 4 -Seen by palliative care last admission, now DNR, plan for outpatient palliative care follow-up - PT OT eval, TOC consult, now interested in  rehab  OSA/OHS -Intolerant to CPAP  History of DVT/PE History of IVC filter -Continue Eliquis   AKI/CKD 3b - Baseline creatinine around 1.6-2, now slightly higher at 2.3 -Likely cardiorenal, monitor with diuresis   Hypertension - Continue amlodipine  - Diuresis as above   Hyperlipidemia - Continue statin   Anxiety - Continue Celexa  and Valium    DM2 -*Stable, continue current regimen of insulin    Hypothyroidism - Continue home Synthroid    CAD - Continue home rosuvastatin , Eliquis    Complete heart block Paroxysmal atrial fibrillation Status post pacemaker - Paced rhythm in the ED - Continue home Eliquis     Anemia Thrombocytopenia - Relatively stable   DVT prophylaxis:      Eliquis  Code Status:              DNR/DNI Family Communication:       None on admission  Disposition Plan: Likely SNF for short-term rehab =  Consultants:    Procedures:   Antimicrobials:    Objective: Vitals:   09/13/23 0025 09/13/23 0355 09/13/23 0721 09/13/23 1051  BP: (!) 136/49 (!) 152/57 (!) 140/54 (!) 155/56  Pulse: (!) 49 (!) 49 (!) 49   Resp: 20 20 16    Temp: 97.7 F (36.5 C) 97.7 F (36.5 C) (!) 97.5 F (36.4 C)   TempSrc: Oral Oral Oral   SpO2: 100% 100% 100%   Weight:  134.5 kg    Height:        Intake/Output Summary (Last 24 hours) at 09/13/2023 1127 Last data filed at 09/13/2023 0854 Gross per 24 hour  Intake 817 ml  Output 650 ml  Net 167 ml   Filed Weights   09/13/23 0355  Weight: 134.5 kg  Examination:  General exam: Obese chronically ill female laying in bed, AAO x 3 HEENT: Neck obese unable to assess JVD CVS: S1-S2, regular rhythm Lungs: Decreased breath sounds otherwise clear Abdomen: Soft, nontender, bowel sounds present Ext: Trace + edema with chronic skin changes and hyperpigmentation Skin: as above Psychiatry:  Mood & affect appropriate.     Data Reviewed:   CBC: Recent Labs  Lab 09/07/23 0522 09/07/23 1005 09/07/23 1011  09/07/23 1015 09/08/23 0523 09/11/23 1219 09/12/23 0552  WBC 5.7  --   --   --  5.6 7.1 6.1  NEUTROABS  --   --   --   --   --  6.1  --   HGB 10.4*   < > 11.2* 11.2* 10.3* 10.6* 9.1*  HCT 34.1*   < > 33.0* 33.0* 33.3* 34.1* 30.4*  MCV 107.2*  --   --   --  108.5* 105.9* 107.8*  PLT 103*  --   --   --  91* 120* 85*   < > = values in this interval not displayed.   Basic Metabolic Panel: Recent Labs  Lab 09/07/23 0522 09/07/23 1005 09/07/23 1015 09/08/23 0523 09/11/23 1219 09/12/23 0552 09/13/23 0303  NA 136   < > 138 137 140 139 138  K 4.3   < > 4.5 4.6 4.8 4.4 4.2  CL 101  --   --  103 104 105 103  CO2 27  --   --  28 23 26 26   GLUCOSE 125*  --   --  127* 161* 116* 123*  BUN 73*  --   --  76* 88* 89* 95*  CREATININE 1.92*  --   --  2.20* 2.38* 2.45* 2.52*  CALCIUM  8.8*  --   --  9.0 9.6 9.0 8.6*  MG  --   --   --   --  2.5*  --   --    < > = values in this interval not displayed.   GFR: Estimated Creatinine Clearance: 28.2 mL/min (A) (by C-G formula based on SCr of 2.52 mg/dL (H)). Liver Function Tests: Recent Labs  Lab 09/12/23 0552  AST 22  ALT 20  ALKPHOS 49  BILITOT 0.8  PROT 5.4*  ALBUMIN  3.4*   No results for input(s): "LIPASE", "AMYLASE" in the last 168 hours. No results for input(s): "AMMONIA" in the last 168 hours. Coagulation Profile: Recent Labs  Lab 09/07/23 0522  INR 1.3*   Cardiac Enzymes: No results for input(s): "CKTOTAL", "CKMB", "CKMBINDEX", "TROPONINI" in the last 168 hours. BNP (last 3 results) No results for input(s): "PROBNP" in the last 8760 hours. HbA1C: No results for input(s): "HGBA1C" in the last 72 hours. CBG: Recent Labs  Lab 09/12/23 1222 09/12/23 1620 09/12/23 2135 09/13/23 0610 09/13/23 1044  GLUCAP 153* 148* 146* 109* 170*   Lipid Profile: No results for input(s): "CHOL", "HDL", "LDLCALC", "TRIG", "CHOLHDL", "LDLDIRECT" in the last 72 hours. Thyroid Function Tests: No results for input(s): "TSH", "T4TOTAL",  "FREET4", "T3FREE", "THYROIDAB" in the last 72 hours. Anemia Panel: No results for input(s): "VITAMINB12", "FOLATE", "FERRITIN", "TIBC", "IRON ", "RETICCTPCT" in the last 72 hours. Urine analysis:    Component Value Date/Time   COLORURINE YELLOW 12/26/2018 1343   APPEARANCEUR HAZY (A) 12/26/2018 1343   LABSPEC 1.011 12/26/2018 1343   PHURINE 8.0 12/26/2018 1343   GLUCOSEU NEGATIVE 12/26/2018 1343   HGBUR NEGATIVE 12/26/2018 1343   BILIRUBINUR NEGATIVE 12/26/2018 1343   KETONESUR NEGATIVE 12/26/2018 1343   PROTEINUR  100 (A) 12/26/2018 1343   UROBILINOGEN 0.2 08/31/2014 1158   NITRITE NEGATIVE 12/26/2018 1343   LEUKOCYTESUR LARGE (A) 12/26/2018 1343   Sepsis Labs: @LABRCNTIP (procalcitonin:4,lacticidven:4)  ) No results found for this or any previous visit (from the past 240 hours).    Radiology Studies: No results found.    Scheduled Meds:  allopurinol   300 mg Oral Daily   amLODipine   5 mg Oral Daily   apixaban   10 mg Oral BID   Followed by   Cecily Cohen ON 09/16/2023] apixaban   5 mg Oral BID   citalopram   20 mg Oral QHS   insulin  aspart  0-15 Units Subcutaneous TID WC   insulin  glargine-yfgn  15 Units Subcutaneous Daily   levothyroxine   75 mcg Oral QAC breakfast   melatonin  5 mg Oral QHS   rosuvastatin   10 mg Oral Daily   sodium chloride  flush  3 mL Intravenous Q12H   torsemide   40 mg Oral BID   Continuous Infusions:   LOS: 1 day    Time spent:    Deforest Fast, MD [facility]   09/13/2023, 11:27 AM

## 2023-09-13 NOTE — [place] FL2 (Signed)
 [place]  MEDICAID FL2 LEVEL OF CARE FORM     IDENTIFICATION  Patient Name: [name] Birthdate: [birth year] Sex: female Admission Date (Current Location): 09/11/2023  [facility] and [place] Number:  Producer, television/film/video and Address:  [facility], [address], [place], [place] [address]      Provider Number:    Attending Physician Name and Address:  Deforest Fast, MD  Relative Name and Phone Number:  Jaquelyn Merino  [phone]    Current Level of Care: Hospital Recommended Level of Care: Skilled Nursing Facility Prior Approval Number:    Date Approved/Denied: 09/13/23 PASRR Number: [id number]  Discharge Plan: SNF    Current Diagnoses: Patient Active Problem List   Diagnosis Date Noted   Acute respiratory failure with hypoxia (HCC) 09/11/2023   Obesity hypoventilation syndrome (HCC) 09/07/2023   WHO group 3 pulmonary arterial hypertension (HCC) 09/07/2023   (HFpEF) heart failure with preserved ejection fraction (HCC) 09/03/2023   Elevated troponin 09/03/2023   Subtherapeutic international normalized ratio (INR) 09/03/2023   Presence of permanent cardiac pacemaker 09/03/2023   History of DVT (deep vein thrombosis) 09/03/2023   Pain in left hip 01/07/2023   Pain in right knee 01/07/2023   Type 2 diabetes mellitus with stage 3b chronic kidney disease, with long-term current use of insulin  (HCC) 09/09/2021   Mixed diabetic hyperlipidemia associated with type 2 diabetes mellitus (HCC) 09/09/2021   Hypothyroidism 09/09/2021   Wound of left leg, initial encounter 09/09/2021   C6 cervical fracture (HCC) 09/01/2018   Atrial flutter (HCC) 09/01/2018   Paroxysmal atrial fibrillation (HCC) 09/01/2018   Syncope 08/29/2018   Symptomatic advanced heart block 08/29/2018   Acute on chronic diastolic CHF (congestive heart failure) (HCC)    COPD (chronic obstructive pulmonary disease) (HCC) 09/12/2015   Personal history of endocrine,  metabolic or immunity disorder 69/62/9528   Decreased mobility 04/03/2015   Deep vein thrombosis (DVT) of both lower extremities (HCC) 02/24/2015   Chronic kidney disease, stage 3b (HCC) 02/24/2015   Long term current use of anticoagulant therapy 11/19/2014   Osteoarthritis 08/31/2014   Cellulitis of hand, right 08/31/2014   Tremors  05/07/2012   Obstructive sleep apnea 05/03/2012   Thrombocytopenia (HCC) 08/18/2011   Diastolic dysfunction, grade 2, EF 65-70% May 2013 08/14/2011   LBBB (left bundle branch block) 08/14/2011   Angina pectoris (HCC) 08/13/2011   Morbid obesity with BMI of 50.0-59.9, adult (HCC) 08/13/2011   Essential hypertension 08/13/2011   Anemia 08/13/2011   History of pulmonary embolism, recurrent, 3 seperate episodes. 08/13/2011   Presence of IVC filter, placed June 2011 after 3d PE 08/13/2011   Pulmonary HTN, Moderate  PA pressure 09/2009 08/13/2011    Orientation RESPIRATION BLADDER Height & Weight     Self, Time, Situation, Place  O2 Continent Weight: 296 lb 8.3 oz (134.5 kg) Height:  5\' 1"  (154.9 cm)  BEHAVIORAL SYMPTOMS/MOOD NEUROLOGICAL BOWEL NUTRITION STATUS      Continent Diet  AMBULATORY STATUS COMMUNICATION OF NEEDS Skin   Limited Assist Verbally Normal                       Personal Care Assistance Level of Assistance  Dressing     Dressing Assistance: Limited assistance     Functional Limitations Info  Sight Sight Info: Impaired        SPECIAL CARE FACTORS FREQUENCY  PT (By licensed PT), OT (By licensed OT)     PT Frequency: 5x  OT Frequency: 3x            Contractures Contractures Info: Not present    Additional Factors Info  Code Status, Allergies Code Status Info: DNR Allergies Info: Ms Contin (Morphine)           Current Medications (09/13/2023):  This is the current hospital active medication list Current Facility-Administered Medications  Medication Dose Route Frequency Provider Last Rate Last Admin    acetaminophen  (TYLENOL ) tablet 650 mg  650 mg Oral Q6H PRN Johnetta Nab, MD   650 mg at 09/13/23 1055   Or   acetaminophen  (TYLENOL ) suppository 650 mg  650 mg Rectal Q6H PRN Johnetta Nab, MD       allopurinol  (ZYLOPRIM ) tablet 300 mg  300 mg Oral Daily Melvin, Alexander B, MD   300 mg at 09/13/23 1046   amLODipine  (NORVASC ) tablet 5 mg  5 mg Oral Daily Melvin, Alexander B, MD   5 mg at 09/13/23 1046   apixaban  (ELIQUIS ) tablet 10 mg  10 mg Oral BID Melvin, Alexander B, MD   10 mg at 09/12/23 2132   Followed by   Cecily Cohen ON 09/16/2023] apixaban  (ELIQUIS ) tablet 5 mg  5 mg Oral BID Melvin, Alexander B, MD       citalopram  (CELEXA ) tablet 20 mg  20 mg Oral QHS Melvin, Alexander B, MD   20 mg at 09/12/23 2132   diazepam  (VALIUM ) tablet 5 mg  5 mg Oral BID PRN Johnetta Nab, MD       insulin  aspart (novoLOG ) injection 0-15 Units  0-15 Units Subcutaneous TID WC Melvin, Alexander B, MD   2 Units at 09/12/23 1639   insulin  glargine-yfgn (SEMGLEE ) injection 15 Units  15 Units Subcutaneous Daily Melvin, Alexander B, MD   15 Units at 09/13/23 1047   levothyroxine  (SYNTHROID ) tablet 75 mcg  75 mcg Oral QAC breakfast Johnetta Nab, MD   75 mcg at 09/13/23 0981   melatonin tablet 5 mg  5 mg Oral QHS Melvin, Alexander B, MD   5 mg at 09/12/23 2132   polyethylene glycol (MIRALAX  / GLYCOLAX ) packet 17 g  17 g Oral Daily PRN Melvin, Alexander B, MD       rosuvastatin  (CRESTOR ) tablet 10 mg  10 mg Oral Daily Melvin, Alexander B, MD   10 mg at 09/13/23 1046   sodium chloride  flush (NS) 0.9 % injection 3 mL  3 mL Intravenous Q12H Melvin, Alexander B, MD   3 mL at 09/13/23 1047   torsemide  (DEMADEX ) tablet 40 mg  40 mg Oral BID Joseph, Preetha, MD   40 mg at 09/13/23 1046     Discharge Medications: Please see discharge summary for a list of discharge medications.  Relevant Imaging Results:  Relevant Lab Results:   Additional Information SS: [id number]  Hilda Lovings,  LCSW

## 2023-09-13 NOTE — Plan of Care (Signed)
  Problem: Education: Goal: Individualized Educational Video(s) Outcome: Progressing   

## 2023-09-13 NOTE — Plan of Care (Signed)
  Problem: Metabolic: Goal: Ability to maintain appropriate glucose levels will improve Outcome: Progressing   Problem: Tissue Perfusion: Goal: Adequacy of tissue perfusion will improve Outcome: Progressing   Problem: Clinical Measurements: Goal: Respiratory complications will improve Outcome: Progressing Goal: Cardiovascular complication will be avoided Outcome: Progressing   Problem: Safety: Goal: Ability to remain free from injury will improve Outcome: Progressing   Problem: Cardiac: Goal: Ability to achieve and maintain adequate cardiopulmonary perfusion will improve Outcome: Progressing

## 2023-09-14 DIAGNOSIS — J9601 Acute respiratory failure with hypoxia: Secondary | ICD-10-CM | POA: Diagnosis not present

## 2023-09-14 LAB — GLUCOSE, CAPILLARY
Glucose-Capillary: 100 mg/dL — ABNORMAL HIGH (ref 70–99)
Glucose-Capillary: 192 mg/dL — ABNORMAL HIGH (ref 70–99)
Glucose-Capillary: 108 mg/dL — ABNORMAL HIGH (ref 70–99)
Glucose-Capillary: 191 mg/dL — ABNORMAL HIGH (ref 70–99)

## 2023-09-14 LAB — BASIC METABOLIC PANEL WITH GFR
Anion gap: 9 (ref 5–15)
BUN: 94 mg/dL — ABNORMAL HIGH (ref 8–23)
CO2: 24 mmol/L (ref 22–32)
Calcium: 8.5 mg/dL — ABNORMAL LOW (ref 8.9–10.3)
Chloride: 106 mmol/L (ref 98–111)
Creatinine, Ser: 2.45 mg/dL — ABNORMAL HIGH (ref 0.44–1.00)
GFR, Estimated: 21 mL/min — ABNORMAL LOW (ref >60)
Glucose, Bld: 113 mg/dL — ABNORMAL HIGH (ref 70–99)
Potassium: 4 mmol/L (ref 3.5–5.1)
Sodium: 139 mmol/L (ref 135–145)

## 2023-09-14 NOTE — Progress Notes (Signed)
   09/14/23 2019  BiPAP/CPAP/SIPAP  Reason BIPAP/CPAP not in use Non-compliant (pt refused at this time.)

## 2023-09-14 NOTE — Plan of Care (Signed)
  Problem: Coping: Goal: Ability to adjust to condition or change in health will improve Outcome: Progressing   Problem: Fluid Volume: Goal: Ability to maintain a balanced intake and output will improve Outcome: Progressing   Problem: Health Behavior/Discharge Planning: Goal: Ability to identify and utilize available resources and services will improve Outcome: Progressing   Problem: Nutritional: Goal: Maintenance of adequate nutrition will improve Outcome: Progressing   Problem: Tissue Perfusion: Goal: Adequacy of tissue perfusion will improve Outcome: Progressing   Problem: Clinical Measurements: Goal: Will remain free from infection Outcome: Progressing Goal: Respiratory complications will improve Outcome: Progressing Goal: Cardiovascular complication will be avoided Outcome: Progressing   Problem: Coping: Goal: Level of anxiety will decrease Outcome: Progressing   Problem: Elimination: Goal: Will not experience complications related to urinary retention Outcome: Progressing   Problem: Safety: Goal: Ability to remain free from injury will improve Outcome: Progressing   Problem: Cardiac: Goal: Ability to achieve and maintain adequate cardiopulmonary perfusion will improve Outcome: Progressing

## 2023-09-14 NOTE — Plan of Care (Signed)

## 2023-09-14 NOTE — Progress Notes (Signed)
 PROGRESS NOTE    [name]  YQM:[id number] DOB: 08-20-1955 DOA: 09/11/2023 PCP: Edda Goo, MD   67/F morbidly obese with CAD, chronic diastolic CHF, pulmonary hypertension, history of PE, OSA/OHS-intolerant to CPAP, DVT, IVC filter, paroxysmal A-fib, thrombocytopenia, chronic anemia recently hospitalized with hypoxia, CHF exacerbation. - Right and left heart cath close to discharge noted improved wedge pressure, pulmonary hypertension,she reports mild improvement with diuresis, and eventually discharged home on O2, declined rehab at the time of discharge, was also seen by palliative care, now DNR with plan for outpatient palliative care, in addition was set up with home O2 at discharge, allegedly ran out of this a few days later at home, also some confusion about torsemide , may not have taken it after discharge - Presented to the ED with worsening dyspnea for 1 to 2 days, some edema, reports compliance with diuretics, when EMS arrived noted to be hypoxic, 85%, placed on 4 L O2 -In the ER hypertensive, mildly hypoxic, creatinine 2.3, hemoglobin 10.6, platelets 120, BNP 598, chest x-ray with mild CHF  Subjective: - Feels fair, some hip pain, breathing close to baseline  Assessment and Plan:  Acute respite failure hypoxia Acute on chronic diastolic CHF Pulmonary hypertension type III OSA/OHS - Recent echo noted EF 60-65%, moderate LVH, normal RV -Recent R/LHC noted no significant CAD, PA pressure 62/16, wedge of 11, PVR 4.4, group 3 pulmonary hypertension suspected -Suspect respiratory failure is multifactorial, some component of volume overload, in addition to untreated OSA/OHS is contributing and also has been allegedly without home O2 - Volume status difficult to assess but may be close to euvolemic now, transition to oral torsemide  -GDMT limited by CKD 4 -Seen by palliative care last admission, now DNR, plan for outpatient palliative care follow-up - PT OT eval, finally agrees to  SNF, TOC following  OSA/OHS -Intolerant to CPAP  History of DVT/PE History of IVC filter -Continue Eliquis   AKI/CKD 3b - Baseline creatinine around 1.6-2, now slightly higher at 2.3 -Likely cardiorenal, monitor with diuresis   Hypertension - Continue amlodipine  - Diuresis as above   Hyperlipidemia - Continue statin   Anxiety - Continue Celexa  and Valium    DM2 -*Stable, continue current regimen of insulin    Hypothyroidism - Continue home Synthroid    CAD - Continue home rosuvastatin , Eliquis    Complete heart block Paroxysmal atrial fibrillation Status post pacemaker - Paced rhythm in the ED - Continue home Eliquis     Anemia Thrombocytopenia - Relatively stable   DVT prophylaxis:      Eliquis  Code Status:              DNR/DNI Family Communication:       None on admission  Disposition Plan: SNF tomorrow if stable =  Consultants:    Procedures:   Antimicrobials:    Objective: Vitals:   09/13/23 1946 09/14/23 0035 09/14/23 0443 09/14/23 0721  BP: (!) 138/45 (!) 117/46 (!) 144/56 (!) 127/43  Pulse: (!) 50  (!) 50 (!) 49  Resp: 20  19 18   Temp: 97.9 F (36.6 C) 98.3 F (36.8 C) 97.8 F (36.6 C) (!) 97.5 F (36.4 C)  TempSrc: Oral Oral Oral Oral  SpO2: 100%  100% 100%  Weight:   131.7 kg   Height:        Intake/Output Summary (Last 24 hours) at 09/14/2023 0959 Last data filed at 09/14/2023 0446 Gross per 24 hour  Intake 574 ml  Output 2050 ml  Net -1476 ml   [employer]  09/13/23 0355 09/14/23 0443  Weight: 134.5 kg 131.7 kg    Examination:  General exam: Obese chronically ill female laying in bed, AAO x 3 HEENT: Neck obese unable to assess JVD CVS: S1-S2, regular rhythm Lungs: Decreased breath sounds otherwise clear Abdomen: Soft, nontender, bowel sounds present Ext: Trace + edema with chronic skin changes and hyperpigmentation Skin: as above Psychiatry:  Mood & affect appropriate.     Data Reviewed:   CBC: Recent Labs  Lab  09/07/23 1011 09/07/23 1015 09/08/23 0523 09/11/23 1219 09/12/23 0552  WBC  --   --  5.6 7.1 6.1  NEUTROABS  --   --   --  6.1  --   HGB 11.2* 11.2* 10.3* 10.6* 9.1*  HCT 33.0* 33.0* 33.3* 34.1* 30.4*  MCV  --   --  108.5* 105.9* 107.8*  PLT  --   --  91* 120* 85*   Basic Metabolic Panel: Recent Labs  Lab 09/08/23 0523 09/11/23 1219 09/12/23 0552 09/13/23 0303 09/14/23 0254  NA 137 140 139 138 139  K 4.6 4.8 4.4 4.2 4.0  CL 103 104 105 103 106  CO2 28 23 26 26 24   GLUCOSE 127* 161* 116* 123* 113*  BUN 76* 88* 89* 95* 94*  CREATININE 2.20* 2.38* 2.45* 2.52* 2.45*  CALCIUM  9.0 9.6 9.0 8.6* 8.5*  MG  --  2.5*  --   --   --    GFR: Estimated Creatinine Clearance: 28.6 mL/min (A) (by C-G formula based on SCr of 2.45 mg/dL (H)). Liver Function Tests: Recent Labs  Lab 09/12/23 0552  AST 22  ALT 20  ALKPHOS 49  BILITOT 0.8  PROT 5.4*  ALBUMIN  3.4*   No results for input(s): "LIPASE", "AMYLASE" in the last 168 hours. No results for input(s): "AMMONIA" in the last 168 hours. Coagulation Profile: No results for input(s): "INR", "PROTIME" in the last 168 hours.  Cardiac Enzymes: No results for input(s): "CKTOTAL", "CKMB", "CKMBINDEX", "TROPONINI" in the last 168 hours. BNP (last 3 results) No results for input(s): "PROBNP" in the last 8760 hours. HbA1C: No results for input(s): "HGBA1C" in the last 72 hours. CBG: Recent Labs  Lab 09/13/23 0610 09/13/23 1044 09/13/23 1538 09/13/23 2128 09/14/23 0605  GLUCAP 109* 170* 128* 137* 100*   Lipid Profile: No results for input(s): "CHOL", "HDL", "LDLCALC", "TRIG", "CHOLHDL", "LDLDIRECT" in the last 72 hours. Thyroid Function Tests: No results for input(s): "TSH", "T4TOTAL", "FREET4", "T3FREE", "THYROIDAB" in the last 72 hours. Anemia Panel: No results for input(s): "VITAMINB12", "FOLATE", "FERRITIN", "TIBC", "IRON ", "RETICCTPCT" in the last 72 hours. Urine analysis:    Component Value Date/Time   COLORURINE YELLOW  12/26/2018 1343   APPEARANCEUR HAZY (A) 12/26/2018 1343   LABSPEC 1.011 12/26/2018 1343   PHURINE 8.0 12/26/2018 1343   GLUCOSEU NEGATIVE 12/26/2018 1343   HGBUR NEGATIVE 12/26/2018 1343   BILIRUBINUR NEGATIVE 12/26/2018 1343   KETONESUR NEGATIVE 12/26/2018 1343   PROTEINUR 100 (A) 12/26/2018 1343   UROBILINOGEN 0.2 08/31/2014 1158   NITRITE NEGATIVE 12/26/2018 1343   LEUKOCYTESUR LARGE (A) 12/26/2018 1343   Sepsis Labs: @LABRCNTIP (procalcitonin:4,lacticidven:4)  ) No results found for this or any previous visit (from the past 240 hours).    Radiology Studies: No results found.    Scheduled Meds:  allopurinol   300 mg Oral Daily   amLODipine   5 mg Oral Daily   apixaban   10 mg Oral BID   Followed by   Cecily Cohen ON 09/16/2023] apixaban   5 mg Oral BID  citalopram   20 mg Oral QHS   insulin  aspart  0-15 Units Subcutaneous TID WC   insulin  glargine-yfgn  15 Units Subcutaneous Daily   levothyroxine   75 mcg Oral QAC breakfast   melatonin  5 mg Oral QHS   rosuvastatin   10 mg Oral Daily   sodium chloride  flush  3 mL Intravenous Q12H   torsemide   40 mg Oral BID   Continuous Infusions:   LOS: 2 days    Time spent:    Deforest Fast, MD [facility]   09/14/2023, 9:59 AM

## 2023-09-14 NOTE — TOC Progression Note (Addendum)
 Transition of Care [facility]) - Progression Note    Patient Details  Name: RIKIA SUKHU MRN: [id number] Date of Birth: 1955-11-05  [facility] of [facility]) CM/SW Contact  Arron Big, [place] Phone Number: 09/14/2023, 11:55 AM  Clinical Narrative:   CSW followed up with patient and provided medicare.gov rating for current bed offers. Patient chose [facility] at this time. Patient inquired about a private room but is agreeable to going if a private is not available.   1:42 PM CSW started pre auth at this time. Certification number [id number]; insurance is currently pending.   3:55 PM Ins still pending.   TOC will continue to follow.    Expected Discharge Plan: Skilled Nursing Facility Barriers to Discharge: Continued Medical Work up  Expected Discharge Plan and Services In-house Referral: Clinical Social Work   Post Acute Care Choice: Skilled Nursing Facility Living arrangements for the past 2 months: Single Family Home                 DME Arranged: Oxygen DME Agency: NA                   Social Determinants of Health (SDOH) Interventions SDOH Screenings   Food Insecurity: No Food Insecurity (09/12/2023)  Housing: Low Risk  (09/12/2023)  Transportation Needs: No Transportation Needs (09/12/2023)  Utilities: Not At Risk (09/12/2023)  Social Connections: Socially Isolated (09/12/2023)  Tobacco Use: Medium Risk (09/12/2023)    Readmission Risk Interventions     No data to display

## 2023-09-14 NOTE — Progress Notes (Signed)
 Mobility Specialist Progress Note:   09/14/23 1150  Mobility  Activity Transferred to/from [facility]  Level of Assistance Contact guard assist, steadying assist  Assistive Device Other (Comment) (HHA)  Distance Ambulated (ft) 3 ft  Activity Response Tolerated well  Mobility Referral Yes  Mobility visit 1 Mobility  Mobility Specialist Start Time (ACUTE ONLY) 1150  Mobility Specialist Stop Time (ACUTE ONLY) 1203  Mobility Specialist Time Calculation (min) (ACUTE ONLY) 13 min   Pt received on BSC, agreeable to transfer to chair. Void successful. Pt able to stand and take steps to chair with HHA. VSS on 1.5LO2. Left with all needs met.   Oneda Big Mobility Specialist Please contact via SecureChat or  Rehab office at [phone]

## 2023-09-14 NOTE — Progress Notes (Signed)
 Heart Failure Navigator Progress Note  Assessed for Heart & Vascular TOC clinic readiness.  Patient does not meet criteria due to Advanced Heart Failure Team patient of Dr. Gala Romney. .   Navigator will sign off at this time.   Rhae Hammock, BSN, Scientist, clinical (histocompatibility and immunogenetics) Only

## 2023-09-15 DIAGNOSIS — J9601 Acute respiratory failure with hypoxia: Secondary | ICD-10-CM | POA: Diagnosis not present

## 2023-09-15 LAB — BASIC METABOLIC PANEL WITH GFR
Anion gap: 8 (ref 5–15)
BUN: 93 mg/dL — ABNORMAL HIGH (ref 8–23)
CO2: 26 mmol/L (ref 22–32)
Calcium: 8.3 mg/dL — ABNORMAL LOW (ref 8.9–10.3)
Chloride: 105 mmol/L (ref 98–111)
Creatinine, Ser: 2.26 mg/dL — ABNORMAL HIGH (ref 0.44–1.00)
GFR, Estimated: 23 mL/min — ABNORMAL LOW (ref >60)
Glucose, Bld: 145 mg/dL — ABNORMAL HIGH (ref 70–99)
Potassium: 4.3 mmol/L (ref 3.5–5.1)
Sodium: 139 mmol/L (ref 135–145)

## 2023-09-15 LAB — GLUCOSE, CAPILLARY
Glucose-Capillary: 119 mg/dL — ABNORMAL HIGH (ref 70–99)
Glucose-Capillary: 167 mg/dL — ABNORMAL HIGH (ref 70–99)
Glucose-Capillary: 160 mg/dL — ABNORMAL HIGH (ref 70–99)
Glucose-Capillary: 123 mg/dL — ABNORMAL HIGH (ref 70–99)

## 2023-09-15 MED ORDER — APIXABAN (ELIQUIS) VTE STARTER PACK (10MG AND 5MG): ORAL_TABLET | ORAL | Status: DC

## 2023-09-15 MED ORDER — INSULIN ASPART 100 UNIT/ML ~~LOC~~ SOLN: 6.0000 [IU] | Freq: Three times a day (TID) | SUBCUTANEOUS | Status: DC

## 2023-09-15 MED ORDER — DIAZEPAM 5 MG PO TABS: 5.0000 mg | ORAL_TABLET | Freq: Two times a day (BID) | ORAL | 0 refills | Status: DC | PRN

## 2023-09-15 NOTE — Progress Notes (Signed)
 Mobility Specialist Progress Note:   09/15/23 1145  Mobility  Activity Ambulated with assistance in hallway  Level of Assistance Contact guard assist, steadying assist  Assistive Device Front wheel walker  Distance Ambulated (ft) 100 ft  Activity Response Tolerated well  Mobility Referral Yes  Mobility visit 1 Mobility  Mobility Specialist Start Time (ACUTE ONLY) 1145  Mobility Specialist Stop Time (ACUTE ONLY) 1200  Mobility Specialist Time Calculation (min) (ACUTE ONLY) 15 min   Pt agreeable to mobility session. Required only contact assist for safety with RW. No LOB noted. Pt c/o hip pain, which is baseline. VSS on RA. Declined sitting in chair at this time, back in bed with all needs met.  Oneda Big Mobility Specialist Please contact via SecureChat or  Rehab office at [phone]

## 2023-09-15 NOTE — Progress Notes (Signed)
 Physical Therapy Treatment Patient Details Name: [name] MRN: [id number] DOB: 1955/12/12 Today's Date: 09/15/2023   History of Present Illness Pt is a 68 y.o. female who presented 09/11/23 with worsening SOB. Admitted for acute respiratory failure hypoxia and ?acute on chronic diastolic CHF. Patient admitted 5/29-6/3 for SOB also. PMH: HTN, afib, heart block s/p PPM, diastolic CHF, DM2 CKD stage IIIb, DVT/PE x 3, s/p IVC filter on Coumadin , hypothyroidism, OSA, HLD, anemia, obesity, pulmonary hypertension type III    PT Comments  Pt resting in bed on arrival and agreeable to session with continued progress towards acute goals, however pt continues to be limited in safe mobility by decreased cardiopulmonary endurance, decreased activity tolerance and pain. Pt demonstrating gait with grossly CGA for safety, however pt continues to require frequent and multiple standing recovery breaks due to fatigue, pain and DOE. Educated pt on breathing techniques and focusing on reducing talking while walking to concentrate on breath, with pt able to demonstrate understanding. Pt continues to benefit from skilled PT services to progress toward functional mobility goals.     If plan is discharge home, recommend the following: Assistance with cooking/housework;Assist for transportation;Help with stairs or ramp for entrance;A little help with walking and/or transfers;A little help with bathing/dressing/bathroom   Can travel by private vehicle     Yes  Equipment Recommendations  None recommended by PT    Recommendations for Other Services       Precautions / Restrictions Precautions Precautions: Fall;Other (comment) Recall of Precautions/Restrictions: Intact Precaution/Restrictions Comments: watch SpO2 Restrictions Weight Bearing Restrictions Per Provider Order: No     Mobility  Bed Mobility Overal bed mobility: Needs Assistance Bed Mobility: Supine to Sit, Sit to Supine     Supine to sit:  Used rails, Contact guard, HOB elevated Sit to supine: Contact guard assist, Used rails   General bed mobility comments: Pt required extra time, HOB elevated, and modA to pull up and scoot hips to sit up R EOB, Pt used rails to return to supine, CGA for safety    Transfers Overall transfer level: Needs assistance Equipment used: Rolling walker (2 wheels) Transfers: Sit to/from Stand, Bed to chair/wheelchair/BSC Sit to Stand: Contact guard assist   Step pivot transfers: Contact guard assist       General transfer comment: CGA for safety    Ambulation/Gait Ambulation/Gait assistance: Contact guard assist Gait Distance (Feet): 105 Feet Assistive device: Rolling walker (2 wheels) Gait Pattern/deviations: Step-through pattern, Decreased stride length, Decreased step length - right, Decreased step length - left, Trunk flexed, Wide base of support Gait velocity: reduced     General Gait Details: Pt takes slow, small, wide steps with a slightly widened BOS. No LOB, CGA for safety. Cued pt for pursed lip breathing and activity pacing. Pt took x4 standing rest breaks   Stairs             Wheelchair Mobility     Tilt Bed    Modified Rankin (Stroke Patients Only)       Balance Overall balance assessment: Needs assistance Sitting-balance support: No upper extremity supported, Feet supported Sitting balance-[name]: Fair Sitting balance - Comments: supervision sitting statically EOB   Standing balance support: Bilateral upper extremity supported, During functional activity, Reliant on assistive device for balance Standing balance-[name]: Poor Standing balance comment: reliant on RW  Communication Communication Communication: No apparent difficulties  Cognition Arousal: Alert Behavior During Therapy: WFL for tasks assessed/performed   PT - Cognitive impairments: No apparent impairments                          Following commands: Intact      Cueing Cueing Techniques: Verbal cues  Exercises      General Comments        Pertinent Vitals/Pain Pain Assessment Pain Assessment: Faces Faces Pain Scale: Hurts a little bit Pain Location: back, hips, and knees Pain Descriptors / Indicators: Discomfort, Grimacing, Guarding Pain Intervention(s): Monitored during session, Limited activity within patient's tolerance    Home Living                          Prior Function            PT Goals (current goals can now be found in the care plan section) Acute Rehab PT Goals Patient Stated Goal: to get stronger and be independent again PT Goal Formulation: With patient Time For Goal Achievement: 09/26/23 Progress towards PT goals: Progressing toward goals    Frequency    Min 2X/week      PT Plan      Co-evaluation              AM-PAC PT "6 Clicks" Mobility   Outcome Measure  Help needed turning from your back to your side while in a flat bed without using bedrails?: A Little Help needed moving from lying on your back to sitting on the side of a flat bed without using bedrails?: A Lot Help needed moving to and from a bed to a chair (including a wheelchair)?: A Little Help needed standing up from a chair using your arms (e.g., wheelchair or bedside chair)?: A Little Help needed to walk in hospital room?: A Little Help needed climbing 3-5 steps with a railing? : Total 6 Click Score: 15    End of Session Equipment Utilized During Treatment: Oxygen Activity Tolerance: Patient tolerated treatment well Patient left: in bed;with call bell/phone within reach Nurse Communication: Mobility status;Patient requests pain meds PT Visit Diagnosis: Unsteadiness on feet (R26.81);Muscle weakness (generalized) (M62.81);Difficulty in walking, not elsewhere classified (R26.2);History of falling (Z91.81);Pain;Other abnormalities of gait and mobility (R26.89) Pain - Right/Left:   (bil) Pain - part of body: Knee;Hip (back)     Time: 1610-9604 PT Time Calculation (min) (ACUTE ONLY): 24 min  Charges:    $Gait Training: 23-37 mins PT General Charges $$ ACUTE PT VISIT: 1 Visit                     Venesha Petraitis R. PTA Acute Rehabilitation Services Office: [phone]   Agapito Horseman 09/15/2023, 3:08 PM

## 2023-09-15 NOTE — Discharge Summary (Addendum)
 Physician Discharge Summary  [name] ZOX:[id number] DOB: 1955-12-19 DOA: 09/11/2023  PCP: Edda Goo, MD  Admit date: 09/11/2023 Discharge date: 09/16/2023  Discharge per Dr Drexel Gentles   Time spent: 45 minutes  Recommendations for Outpatient Follow-up:  Advanced heart failure clinic Dr. Julane Ny in 2 to 3 weeks PCP in 1 week, check BMP at follow-up Switch Eliquis  to 5 mg twice a day in 2 days Outpatient palliative care   Discharge Diagnoses:  Principal Problem:   Acute respiratory failure with hypoxia [facility]) Active Problems:   Diastolic dysfunction, grade 2, EF 65-70% May 2013   Acute on chronic diastolic CHF (congestive heart failure) (HCC)   Chronic kidney disease, stage 3b (HCC)   Paroxysmal atrial fibrillation (HCC)   Presence of permanent cardiac pacemaker   Essential hypertension   Anemia   History of DVT (deep vein thrombosis)   COPD (chronic obstructive pulmonary disease) (HCC)   Type 2 diabetes mellitus with stage 3b chronic kidney disease, with long-term current use of insulin  (HCC)   Hypothyroidism   Mixed diabetic hyperlipidemia associated with type 2 diabetes mellitus (HCC)   Obstructive sleep apnea   Morbid obesity with BMI of 50.0-59.9, adult (HCC)   Presence of IVC filter, placed June 2011 after 3d PE   Thrombocytopenia (HCC)   Symptomatic advanced heart block   Obesity hypoventilation syndrome (HCC)   WHO group 3 pulmonary arterial hypertension (HCC) DNR  Discharge Condition: Improved  Diet recommendation: Low-sodium, heart healthy, diabetic  Filed Weights   09/13/23 0355 09/14/23 0443 09/15/23 0405  Weight: 134.5 kg 131.7 kg 131.1 kg    History of present illness:  68/F morbidly obese with CAD, chronic diastolic CHF, pulmonary hypertension, history of PE, OSA/OHS-intolerant to CPAP, DVT, IVC filter, paroxysmal A-fib, thrombocytopenia, chronic anemia recently hospitalized with hypoxia, CHF exacerbation. - Right and left heart cath close to  discharge noted improved wedge pressure, pulmonary hypertension,she reports mild improvement with diuresis, and eventually discharged home on O2, declined rehab at the time of discharge, was also seen by palliative care, now DNR with plan for outpatient palliative care, in addition was set up with home O2 at discharge, allegedly ran out of this a few days later at home, also some confusion about torsemide , may not have taken it after discharge - Presented to the ED with worsening dyspnea for 1 to 2 days, some edema, reports compliance with diuretics, when EMS arrived noted to be hypoxic, 85%, placed on 4 L O2 -In the ER hypertensive, mildly hypoxic, creatinine 2.3, hemoglobin 10.6, platelets 120, BNP 598, chest x-ray with mild CHF   Hospital Course:   Acute respite failure hypoxia Acute on chronic diastolic CHF Pulmonary hypertension type III OSA/OHS - Recent echo noted EF 60-65%, moderate LVH, normal RV -Recent R/LHC noted no significant CAD, PA pressure 62/16, wedge of 11, PVR 4.4, group 3 pulmonary hypertension suspected -Suspect respiratory failure is multifactorial, some component of volume overload, in addition to untreated OSA/OHS is contributing and also has been allegedly without home O2 - Volume status is improving, transitioned back to oral torsemide  40 Mg twice daily -GDMT limited by CKD 4 -Seen by palliative care last admission, now DNR, plan for outpatient palliative care follow-up - PT OT eval completed, now plan for SNF at discharge -Follow-up in advanced heart failure clinic   OSA/OHS Morbid obesity - Unfortunately intolerant to CPAP   History of DVT/PE History of IVC filter -Continue Eliquis , switch to lower dose 5 Mg twice daily in 2 days  AKI/CKD 3b - Baseline creatinine around 1.6-2, now slightly higher at 2.3 -Likely cardiorenal, now improving, creatinine down to 2.1 today   Hypertension - Continue amlodipine  - Diuresis as above   Hyperlipidemia - Continue  statin   Anxiety - Continue Celexa  and Valium    DM2 -*Stable, continue current regimen of insulin    Hypothyroidism - Continue home Synthroid    CAD - Continue home rosuvastatin , Eliquis    Complete heart block Paroxysmal atrial fibrillation Status post pacemaker - Paced rhythm in the ED - Continue home Eliquis     Anemia Thrombocytopenia - Relatively stable  Code Status:              DNR/DNI   Discharge Exam: Vitals:   09/15/23 0824 09/15/23 0924  BP: 130/68 (!) 132/46  Pulse: (!) 50 (!) 50  Resp: 14 20  Temp: 98.1 F (36.7 C) 97.9 F (36.6 C)  SpO2: 97% 98%   General exam: Obese chronically ill female laying in bed, AAO x 3 HEENT: Neck obese unable to assess JVD CVS: S1-S2, regular rhythm Lungs: Decreased breath sounds otherwise clear Abdomen: Soft, nontender, bowel sounds present Ext: Trace + edema with chronic skin changes and hyperpigmentation Skin: as above Psychiatry:  Mood & affect appropriate.  Discharge Instructions    Allergies as of 09/15/2023       Reactions   Ms Contin [morphine] Hives, Rash           Medication List     STOP taking these medications    spironolactone  25 MG tablet Commonly known as: ALDACTONE        TAKE these medications    allopurinol  300 MG tablet Commonly known as: ZYLOPRIM  Take 300 mg by mouth daily.   amLODipine  5 MG tablet Commonly known as: NORVASC  Take 1 tablet (5 mg total) by mouth daily.   Apixaban  Starter Pack (10mg  and 5mg ) Commonly known as: ELIQUIS  STARTER PACK Take as directed on package: take 10mg  twice daily through 6/11. On 6/12, decrease to 5mg  twice daily. What changed: additional instructions   Centrum Silver Chew Chew 2 each by mouth daily.   citalopram  20 MG tablet Commonly known as: CELEXA  Take 20 mg by mouth at bedtime.   diazepam  5 MG tablet Commonly known as: VALIUM  Take 1 tablet (5 mg total) by mouth 2 (two) times daily as needed for anxiety or muscle spasms.    diphenhydramine -acetaminophen  25-500 MG Tabs tablet Commonly known as: TYLENOL  PM Take 1 tablet by mouth at bedtime.   HAIR SKIN & NAILS GUMMIES PO Take 2 each by mouth daily at 4 PM.   insulin  aspart 100 UNIT/ML injection Commonly known as: novoLOG  Inject 6 Units into the skin 3 (three) times daily with meals. What changed: how much to take   IRON  PO Take 1 tablet by mouth daily.   Lantus  SoloStar 100 UNIT/ML Solostar Pen Generic drug: insulin  glargine Inject 20 Units into the skin daily.   levothyroxine  75 MCG tablet Commonly known as: SYNTHROID  Take 75 mcg by mouth daily before breakfast.   MELATONIN PO Take 2 each by mouth at bedtime. Melatonin gummies   rosuvastatin  10 MG tablet Commonly known as: CRESTOR  Take 1 tablet (10 mg total) by mouth daily.   torsemide  20 MG tablet Commonly known as: DEMADEX  Take 2 tablets (40mg ) by mouth twice daily, in the morning and at 4:00pm       Allergies  Allergen Reactions   Ms Contin [Morphine] Hives and Rash  Contact information for after-discharge care     Destination     [facility] .   Service: Skilled Nursing Contact information: [address] [place]  [address] [phone]                      The results of significant diagnostics from this hospitalization (including imaging, microbiology, ancillary and laboratory) are listed below for reference.    Significant Diagnostic Studies: DG Chest 2 View Result Date: 09/11/2023 CLINICAL DATA:  Shortness of breath EXAM: CHEST - 2 VIEW COMPARISON:  Sep 03, 2023 FINDINGS: Persistent bilateral pulmonary infiltrates correlate with bilateral congestive changes mild cardiomegaly correlate with CHF. No change in the left subclavian pacemaker device IMPRESSION: Mild CHF Electronically Signed   By: Fredrich Jefferson M.D.   On: 09/11/2023 11:32   CARDIAC CATHETERIZATION Result Date: 09/07/2023 1.  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 2.  No obstructive coronary artery disease. 3.  Fick cardiac output of 5.9 L/min and Fick cardiac index of 2.7 L/min/m with the following hemodynamics:  Right atrial pressure mean of 13 mmHg  Right ventricular pressure 64/2 with an end-diastolic pressure of 14 mmHg  PA pressure 62/18 with a mean of 37 mmHg  Wedge pressure mean of 11 mmHg with V waves to 22 mmHg  Pulmonary vascular resistance of 4.4 Woods units  PA pulsatility index of 3.4 4.  LVEDP of 23 mmHg Recommendation: Resting hypercapnia and hypoxia during this procedure with an arterial blood gas demonstrating a pCO2 of and a PO2 of in conjunction with elevated PVR may suggest group 3 pulmonary hypertension.    CT CHEST WO CONTRAST Result Date: 09/05/2023 CLINICAL DATA:  Dyspnea, chronic, unclear in etiology. Shortness of breath. EXAM: CT CHEST WITHOUT CONTRAST TECHNIQUE: Multidetector CT imaging of the chest was performed following the standard protocol without IV contrast. RADIATION DOSE REDUCTION: This exam was performed according to the departmental dose-optimization program which includes automated exposure control, adjustment of the mA and/or kV according to patient size and/or use of iterative reconstruction technique. COMPARISON:  09/14/2015. FINDINGS: Cardiovascular: The heart is enlarged and there is a trace pericardial effusion. A pacemaker lead is noted in the heart. There is calcification of the mitral valve annulus. Atherosclerotic calcification of the aorta is noted without evidence of aneurysm. The pulmonary trunk is distended suggesting underlying pulmonary artery hypertension. Mediastinum/Nodes: No mediastinal or axillary lymphadenopathy. Evaluation of the hila is limited due to lack of IV contrast. The thyroid gland, trachea, and esophagus are within normal limits. Lungs/Pleura: There is a small right pleural effusion. Scattered  ground-glass opacities are noted in the lungs bilaterally. Minimal atelectasis or infiltrate is present in the right lower lobe. No significant pulmonary nodule or mass is seen. Upper Abdomen: The gallbladder is surgically absent. No acute abnormality. Musculoskeletal: A pacemaker device is present in the anterior left chest. Degenerative changes are present in the thoracic spine. No acute osseous abnormality is seen. IMPRESSION: 1. Scattered ground-glass opacities in the lungs bilaterally, possible edema or infiltrate. 2. Small right pleural effusion. 3. Distended pulmonary trunk suggesting underlying pulmonary artery hypertension. 4. Scattered coronary artery calcifications. 5. Aortic atherosclerosis. Electronically Signed   By: Wyvonnia Heimlich M.D.   On: 09/05/2023 17:12   ECHOCARDIOGRAM COMPLETE Result Date: 09/04/2023    ECHOCARDIOGRAM REPORT   Patient Name:   SUZANNA ZAHN Date of Exam: 09/04/2023 Medical Rec #:  [id number]        Height:       61.0 in Accession #:    [id number]       Weight:       286.8 lb Date of Birth:  1956-01-23        BSA:          2.202 m Patient Age:    67 years         BP:           162/53 mmHg Patient Gender: F                HR:           50 bpm. Exam Location:  Inpatient Procedure: 2D Echo, Cardiac Doppler, Color Doppler and Intracardiac            Opacification Agent (Both Spectral and Color Flow Doppler were            utilized during procedure). Indications:    CHF I50.9  History:        Patient has prior history of Echocardiogram examinations, most                 recent 09/09/2021. Risk Factors:Hypertension and Sleep Apnea.  Sonographer:    Jeralene Mom Referring Phys: [id number] ZANE ADAMS IMPRESSIONS  1. Left ventricular ejection fraction, by estimation, is 60 to 65%. The left ventricle has normal function. The left ventricle has no regional wall motion abnormalities. The left ventricular internal cavity size was mildly dilated. There is moderate concentric left  ventricular hypertrophy. Left ventricular diastolic function could not be evaluated. Elevated left atrial pressure.  2. Right ventricular systolic function is normal. The right ventricular size is normal.  3. Left atrial size was moderately dilated.  4. Right atrial size was moderately dilated.  5. The mitral valve is normal in structure. No evidence of mitral valve regurgitation. No evidence of mitral stenosis. Moderate to severe mitral annular calcification.  6. The aortic valve is tricuspid. There is mild calcification of the aortic valve. There is mild thickening of the aortic valve. Aortic valve regurgitation is not visualized. Aortic valve sclerosis/calcification is present, without any evidence of aortic stenosis.  7. The inferior vena cava is normal in size with greater than 50% respiratory variability, suggesting right atrial pressure of 3 mmHg. FINDINGS  Left Ventricle: Left ventricular ejection fraction, by estimation, is 60 to 65%. The left ventricle has normal function. The left ventricle has no regional wall motion abnormalities. The left ventricular internal cavity size was mildly dilated. There is  moderate concentric left ventricular hypertrophy. Left ventricular diastolic function could not be evaluated due to mitral annular calcification (moderate or greater). Left ventricular diastolic function could not be evaluated. Elevated left atrial pressure. Right Ventricle: The right ventricular size is normal. No increase in right ventricular wall thickness. Right ventricular systolic function is normal. Left Atrium: Left atrial size was moderately dilated. Right Atrium: Right atrial size was moderately dilated. Pericardium: There is no evidence of pericardial effusion. Mitral Valve: The mitral valve is normal in structure. Moderate to severe mitral annular calcification. No evidence of mitral valve regurgitation. No evidence of mitral valve stenosis. Tricuspid Valve: The tricuspid valve is normal in  structure. Tricuspid valve regurgitation is mild . No evidence of tricuspid stenosis. Aortic Valve: The aortic valve is tricuspid. There is mild calcification of the aortic valve. There is mild thickening of the aortic valve. Aortic valve regurgitation is not visualized. Aortic valve  sclerosis/calcification is present, without any evidence of aortic stenosis. Aortic valve mean gradient measures 6.5 mmHg. Aortic valve peak gradient measures 12.2 mmHg. Aortic valve area, by VTI measures 2.12 cm. Pulmonic Valve: The pulmonic valve was normal in structure. Pulmonic valve regurgitation is mild. No evidence of pulmonic stenosis. Aorta: The aortic root is normal in size and structure. Venous: The inferior vena cava is normal in size with greater than 50% respiratory variability, suggesting right atrial pressure of 3 mmHg. IAS/Shunts: No atrial level shunt detected by color flow Doppler. Additional Comments: A device lead is visualized.  LEFT VENTRICLE PLAX 2D LVIDd:         5.40 cm   Diastology LVIDs:         3.50 cm   LV e' medial:    5.43 cm/s LV PW:         1.30 cm   LV E/e' medial:  23.1 LV IVS:        1.30 cm   LV e' lateral:   6.87 cm/s LVOT diam:     2.10 cm   LV E/e' lateral: 18.3 LV SV:         92 LV SV Index:   42 LVOT Area:     3.46 cm  RIGHT VENTRICLE RV S prime:     13.30 cm/s TAPSE (M-mode): 2.1 cm LEFT ATRIUM              Index        RIGHT ATRIUM           Index LA Vol (A2C):   104.0 ml 47.24 ml/m  RA Area:     25.40 cm LA Vol (A4C):   85.4 ml  38.79 ml/m  RA Volume:   87.10 ml  39.56 ml/m LA Biplane Vol: 93.8 ml  42.61 ml/m  AORTIC VALVE                     PULMONIC VALVE AV Area (Vmax):    2.32 cm      PR End Diast Vel: 3.82 msec AV Area (Vmean):   2.18 cm AV Area (VTI):     2.12 cm AV Vmax:           174.50 cm/s AV Vmean:          119.000 cm/s AV VTI:            0.435 m AV Peak Grad:      12.2 mmHg AV Mean Grad:      6.5 mmHg LVOT Vmax:         117.00 cm/s LVOT Vmean:        74.900 cm/s LVOT  VTI:          0.266 m LVOT/AV VTI ratio: 0.61  AORTA Ao Root diam: 3.00 cm Ao Asc diam:  3.50 cm MITRAL VALVE                TRICUSPID VALVE MV Area (PHT): 4.06 cm     TR Peak grad:   23.6 mmHg MV Decel Time: 187 msec     TR Vmax:        243.00 cm/s MV E velocity: 125.50 cm/s MV A velocity: 54.00 cm/s   SHUNTS MV E/A ratio:  2.32         Systemic VTI:  0.27 m  Systemic Diam: 2.10 cm Arta Lark Electronically signed by Arta Lark Signature Date/Time: 09/04/2023/8:26:36 PM    Final    VAS US  LOWER EXTREMITY VENOUS (DVT) Result Date: 09/04/2023  Lower Venous DVT Study Patient Name:  CARMALETA YOUNGERS  Date of Exam:   09/04/2023 Medical Rec #: [id number]         Accession #:    [id number] Date of Birth: 08/31/1955         Patient Gender: F Patient Age:   58 years Exam Location:  [facility] Procedure:      VAS US  LOWER EXTREMITY VENOUS (DVT) Referring Phys: Laquita Plant --------------------------------------------------------------------------------  Indications: History of PE and DVTs. Other Indications: Discoloration of both lower extremities. Risk Factors: Obesity. Anticoagulation: Heparin . Limitations: Body habitus. Performing Technologist: Franky Ivanoff Sturdivant-Jones RDMS, RVT  Examination Guidelines: A complete evaluation includes B-mode imaging, spectral Doppler, color Doppler, and power Doppler as needed of all accessible portions of each vessel. Bilateral testing is considered an integral part of a complete examination. Limited examinations for reoccurring indications may be performed as noted. The reflux portion of the exam is performed with the patient in reverse Trendelenburg.  +---------+---------------+---------+-----------+----------+---------------+ RIGHT    CompressibilityPhasicitySpontaneityPropertiesThrombus Aging  +---------+---------------+---------+-----------+----------+---------------+ CFV      Full           Yes      Yes                                   +---------+---------------+---------+-----------+----------+---------------+ SFJ      Full                                                         +---------+---------------+---------+-----------+----------+---------------+ FV Prox  Full                                                         +---------+---------------+---------+-----------+----------+---------------+ FV Mid   Full                                                         +---------+---------------+---------+-----------+----------+---------------+ FV DistalFull                                                         +---------+---------------+---------+-----------+----------+---------------+ PFV      Full                                                         +---------+---------------+---------+-----------+----------+---------------+ POP      Full                                                         +---------+---------------+---------+-----------+----------+---------------+  PTV                                                   patent by color +---------+---------------+---------+-----------+----------+---------------+ PERO                                                  patent by color +---------+---------------+---------+-----------+----------+---------------+   +---------+---------------+---------+-----------+----------+-------------------+ LEFT     CompressibilityPhasicitySpontaneityPropertiesThrombus Aging      +---------+---------------+---------+-----------+----------+-------------------+ CFV      None           No       No                   Acute               +---------+---------------+---------+-----------+----------+-------------------+ SFJ      Partial                                      Acute               +---------+---------------+---------+-----------+----------+-------------------+ FV Prox  Partial                                       Age Indeterminate   +---------+---------------+---------+-----------+----------+-------------------+ FV Mid   Partial                                      Age Indeterminate   +---------+---------------+---------+-----------+----------+-------------------+ FV DistalPartial                                      Age Indeterminate   +---------+---------------+---------+-----------+----------+-------------------+ PFV      Full                                                             +---------+---------------+---------+-----------+----------+-------------------+ POP      None                                         Age Indeterminate   +---------+---------------+---------+-----------+----------+-------------------+ PTV                                                   Not well visualized +---------+---------------+---------+-----------+----------+-------------------+ PERO  Not well visualized +---------+---------------+---------+-----------+----------+-------------------+ Bilateral iliac veins were not visualized due to body habitus.  Summary: RIGHT: - There is no evidence of deep vein thrombosis in the lower extremity.  LEFT: - Findings consistent with acute deep vein thrombosis involving the left common femoral vein. - Findings consistent with acute superficial vein thrombosis involving the left great saphenous vein.  - Findings consistent with age indeterminate deep vein thrombosis involving the left femoral vein, and left popliteal vein.   *See table(s) above for measurements and observations. Electronically signed by Jimmye Moulds MD on 09/04/2023 at 6:14:13 PM.    Final    DG Chest Port 1 View Result Date: 09/03/2023 CLINICAL DATA:  Cough. EXAM: PORTABLE CHEST 1 VIEW COMPARISON:  Chest radiograph dated 12/23/2021. FINDINGS: There is cardiomegaly with vascular congestion. Bilateral mid to lower lung field densities,  likely edema. Pneumonia is not excluded. No large pleural effusion. No pneumothorax. Left pectoral pacemaker device. No acute osseous pathology. IMPRESSION: Cardiomegaly with vascular congestion and edema. Pneumonia is not excluded. Electronically Signed   By: Angus Bark M.D.   On: 09/03/2023 11:13   CUP PACEART REMOTE DEVICE CHECK Result Date: 08/26/2023 PPM Scheduled remote reviewed. Normal device function.  Presenting rhythm:  VP Recent increase in VP per trends, known heart block Next remote 91 days. LA, CVRS   Microbiology: No results found for this or any previous visit (from the past 240 hours).   Labs: Basic Metabolic Panel: Recent Labs  Lab 09/11/23 1219 09/12/23 0552 09/13/23 0303 09/14/23 0254 09/15/23 0249  NA 140 139 138 139 139  K 4.8 4.4 4.2 4.0 4.3  CL 104 105 103 106 105  CO2 23 26 26 24 26   GLUCOSE 161* 116* 123* 113* 145*  BUN 88* 89* 95* 94* 93*  CREATININE 2.38* 2.45* 2.52* 2.45* 2.26*  CALCIUM  9.6 9.0 8.6* 8.5* 8.3*  MG 2.5*  --   --   --   --    Liver Function Tests: Recent Labs  Lab 09/12/23 0552  AST 22  ALT 20  ALKPHOS 49  BILITOT 0.8  PROT 5.4*  ALBUMIN  3.4*   No results for input(s): LIPASE, AMYLASE in the last 168 hours. No results for input(s): AMMONIA in the last 168 hours. CBC: Recent Labs  Lab 09/11/23 1219 09/12/23 0552  WBC 7.1 6.1  NEUTROABS 6.1  --   HGB 10.6* 9.1*  HCT 34.1* 30.4*  MCV 105.9* 107.8*  PLT 120* 85*   Cardiac Enzymes: No results for input(s): CKTOTAL, CKMB, CKMBINDEX, TROPONINI in the last 168 hours. BNP: BNP (last 3 results) Recent Labs    09/03/23 0949 09/11/23 1219  BNP 455.1* 598.4*    ProBNP (last 3 results) No results for input(s): PROBNP in the last 8760 hours.  CBG: Recent Labs  Lab 09/14/23 0605 09/14/23 1111 09/14/23 1559 09/14/23 2123 09/15/23 0602  GLUCAP 100* 192* 108* 191* 119*       Signed:  Deforest Fast MD.  [facility] 09/15/2023,  11:08 AM

## 2023-09-15 NOTE — TOC Progression Note (Addendum)
 Transition of Care [facility]) - Progression Note    Patient Details  Name: MYLISSA LAMBE MRN: [id number] Date of Birth: Mar 06, 1956  Transition of Care [facility]) CM/SW Contact  Arron Big, [place] Phone Number: 09/15/2023, 8:41 AM  Clinical Narrative:   Ins auth pending.   2:18 PM Auth still pending at this time.   TOC will continue to follow.    Expected Discharge Plan: Skilled Nursing Facility Barriers to Discharge: Continued Medical Work up  Expected Discharge Plan and Services In-house Referral: Clinical Social Work   Post Acute Care Choice: Skilled Nursing Facility Living arrangements for the past 2 months: Single Family Home                 DME Arranged: Oxygen DME Agency: NA                   Social Determinants of Health (SDOH) Interventions SDOH Screenings   Food Insecurity: No Food Insecurity (09/12/2023)  Housing: Low Risk  (09/12/2023)  Transportation Needs: No Transportation Needs (09/12/2023)  Utilities: Not At Risk (09/12/2023)  Social Connections: Socially Isolated (09/12/2023)  Tobacco Use: Medium Risk (09/12/2023)    Readmission Risk Interventions     No data to display

## 2023-09-15 NOTE — Care Management Important Message (Signed)
 Important Message  Patient Details  Name: [name] MRN: [id number] Date of Birth: 08-Jan-1956   Important Message Given:  Yes - Medicare IM     Janith Melnick 09/15/2023, 10:20 AM

## 2023-09-15 NOTE — Progress Notes (Signed)
   09/15/23 2105  BiPAP/CPAP/SIPAP  Reason BIPAP/CPAP not in use Non-compliant  FiO2 (%) 24 %  BiPAP/CPAP /SiPAP Vitals  Pulse Rate (!) 50  Resp 16  SpO2 98 %  MEWS Score/Color  MEWS Score 1  MEWS Score Color Green     RT NOTE: RT to room for ordered CPAP. PT states that she injured her neck and cannot wear CPAP. She states that she cannot tolerate the mask and that it makes her neck, head, and teeth hurt. RT instructed PT to call if she changes mind and wants to try wearing CPAP tonight.

## 2023-09-16 DIAGNOSIS — I5033 Acute on chronic diastolic (congestive) heart failure: Secondary | ICD-10-CM | POA: Diagnosis not present

## 2023-09-16 DIAGNOSIS — J9601 Acute respiratory failure with hypoxia: Secondary | ICD-10-CM | POA: Diagnosis not present

## 2023-09-16 LAB — BASIC METABOLIC PANEL WITH GFR
Anion gap: 9 (ref 5–15)
BUN: 96 mg/dL — ABNORMAL HIGH (ref 8–23)
CO2: 26 mmol/L (ref 22–32)
Calcium: 8.7 mg/dL — ABNORMAL LOW (ref 8.9–10.3)
Chloride: 105 mmol/L (ref 98–111)
Creatinine, Ser: 2.16 mg/dL — ABNORMAL HIGH (ref 0.44–1.00)
GFR, Estimated: 25 mL/min — ABNORMAL LOW (ref >60)
Glucose, Bld: 97 mg/dL (ref 70–99)
Potassium: 4.2 mmol/L (ref 3.5–5.1)
Sodium: 140 mmol/L (ref 135–145)

## 2023-09-16 LAB — GLUCOSE, CAPILLARY
Glucose-Capillary: 88 mg/dL (ref 70–99)
Glucose-Capillary: 144 mg/dL — ABNORMAL HIGH (ref 70–99)

## 2023-09-16 MED ORDER — DIAZEPAM 5 MG PO TABS
5.0000 mg | ORAL_TABLET | Freq: Two times a day (BID) | ORAL | 0 refills | Status: DC | PRN
Start: 2023-09-16 — End: 2023-11-26

## 2023-09-16 NOTE — Progress Notes (Signed)
 Patient is feeling better, dyspnea and edema have improved, continue very weak and deconditioned  BP (!) 132/47 (BP Location: Left Arm)   Pulse (!) 52   Temp 98.1 F (36.7 C) (Oral)   Resp 18   Ht 5' 1 (1.549 m)   Wt 130.5 kg   SpO2 99%   BMI 54.36 kg/m   Neurology awake and alert ENT with mild pallor Cardiovascular with S1 and S2 present and regular with no gallops, rubs or murmurs Respiratory with no rales, rhonchi or wheezing Abdomen with no distention  No ankle edema, positive lymphedema  Dx. Acute on chronic heart failure diastolic.  Improved volume status, renal function stable. Plan for discharge to SNF today to continue physical and occupational therapy.

## 2023-09-16 NOTE — TOC Progression Note (Signed)
 Transition of Care [facility]) - Progression Note    Patient Details  Name: [name] MRN: [id number] Date of Birth: 1955-09-22  Transition of Care [facility]) CM/SW Contact  Arron Big, [place] Phone Number: 09/16/2023, 8:37 AM  Clinical Narrative:   Insurance approved; approval dates 2023-09-11 - 2023-09-20. CSW notified Attending and facility.   TOC will continue to follow.    Expected Discharge Plan: Skilled Nursing Facility Barriers to Discharge: Continued Medical Work up  Expected Discharge Plan and Services In-house Referral: Clinical Social Work   Post Acute Care Choice: Skilled Nursing Facility Living arrangements for the past 2 months: Single Family Home                 DME Arranged: Oxygen DME Agency: NA                   Social Determinants of Health (SDOH) Interventions SDOH Screenings   Food Insecurity: No Food Insecurity (09/12/2023)  Housing: Low Risk  (09/12/2023)  Transportation Needs: No Transportation Needs (09/12/2023)  Utilities: Not At Risk (09/12/2023)  Social Connections: Socially Isolated (09/12/2023)  Tobacco Use: Medium Risk (09/12/2023)    Readmission Risk Interventions     No data to display

## 2023-09-16 NOTE — Progress Notes (Signed)
 Occupational Therapy Treatment Patient Details Name: [name] MRN: [id number] DOB: July 04, 1955 Today's Date: 09/16/2023   History of present illness Pt is a 68 y.o. female who presented 09/11/23 with worsening SOB. Admitted for acute respiratory failure hypoxia and ?acute on chronic diastolic CHF. Patient admitted 5/29-6/3 for SOB also. 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 Patient continues to demonstrate good gains with OT treatment. Patient able to get to EOB with CGA and ambulate to sink with CGA. Patient able to perform 50% of self care tasks standing with increased time to recover with seated rest breaks. Patient in recliner at end of session with all needs met. Patient will benefit from continued inpatient follow up therapy, <3 hours/day.  Acute OT to continue to follow to address established goals to facilitate DC to next venue of care.        If plan is discharge home, recommend the following:  A little help with walking and/or transfers;A little help with bathing/dressing/bathroom;Assistance with cooking/housework   Equipment Recommendations  None recommended by OT    Recommendations for Other Services      Precautions / Restrictions Precautions Precautions: Fall;Other (comment) Recall of Precautions/Restrictions: Intact Precaution/Restrictions Comments: watch SpO2 Restrictions Weight Bearing Restrictions Per Provider Order: No       Mobility Bed Mobility Overal bed mobility: Needs Assistance Bed Mobility: Supine to Sit     Supine to sit: Used rails, Contact guard, HOB elevated     General bed mobility comments: increased time and CGA    Transfers Overall transfer level: Needs assistance Equipment used: Rolling walker (2 wheels) Transfers: Sit  to/from Stand, Bed to chair/wheelchair/BSC Sit to Stand: Contact guard assist           General transfer comment: CGA for safety     Balance Overall balance assessment: Needs assistance Sitting-balance support: No upper extremity supported, Feet supported Sitting balance-[name]: Fair Sitting balance - Comments: supervision sitting statically EOB   Standing balance support: Bilateral upper extremity supported, During functional activity Standing balance-[name]: Poor Standing balance comment: reliant on sink or RW for support                           ADL either performed or assessed with clinical judgement   ADL Overall ADL's : Needs assistance/impaired     Grooming: Wash/dry hands;Wash/dry face;Oral care;Brushing hair;Contact guard assist;Standing Grooming Details (indicate cue type and reason): uses sink for support Upper Body Bathing: Contact guard assist;Standing Upper Body Bathing Details (indicate cue type and reason): sink for support     Upper Body Dressing : Set up;Sitting Upper Body Dressing Details (indicate cue type and reason): gown to cover back Lower Body Dressing: Maximal assistance;Sitting/lateral leans Lower Body Dressing Details (indicate cue type and reason): for socks Toilet Transfer: Ambulation;Rolling walker (2 wheels);Contact guard assist Toilet Transfer Details (indicate cue type and reason): simulated         Functional mobility during ADLs: Rolling walker (2 wheels);Contact guard assist General ADL Comments: CGA for safety    Extremity/Trunk Assessment              Vision       Perception     Praxis     Communication Communication Communication: No apparent  difficulties   Cognition Arousal: Alert Behavior During Therapy: WFL for tasks assessed/performed Cognition: No apparent impairments                               Following commands: Intact        Cueing   Cueing Techniques: Verbal  cues  Exercises      Shoulder Instructions       General Comments SpO2 98% on 1 liter    Pertinent Vitals/ Pain       Pain Assessment Pain Assessment: Faces Faces Pain Scale: Hurts a little bit Pain Location: back, hips, and knees Pain Descriptors / Indicators: Discomfort, Grimacing, Guarding Pain Intervention(s): Monitored during session, Repositioned  Home Living                                          Prior Functioning/Environment              Frequency  Min 2X/week        Progress Toward Goals  OT Goals(current goals can now be found in the care plan section)  Progress towards OT goals: Progressing toward goals  Acute Rehab OT Goals Patient Stated Goal: go home to see her dog OT Goal Formulation: With patient Time For Goal Achievement: 09/27/23 Potential to Achieve Goals: Good ADL Goals Pt Will Perform Grooming: with modified independence;standing Pt Will Perform Lower Body Dressing: with modified independence;sit to/from stand;with adaptive equipment Pt Will Transfer to Toilet: with modified independence;ambulating Pt/caregiver will Perform Home Exercise Program: Both right and left upper extremity;Increased strength Additional ADL Goal #1: Pt will demonstrate independence with 3 energy conservation strategies during ADL/IADL and functional mobility  Plan      Co-evaluation                 AM-PAC OT 6 Clicks Daily Activity     Outcome Measure   Help from another person eating meals?: None Help from another person taking care of personal grooming?: A Little Help from another person toileting, which includes using toliet, bedpan, or urinal?: A Lot Help from another person bathing (including washing, rinsing, drying)?: A Lot Help from another person to put on and taking off regular upper body clothing?: A Little Help from another person to put on and taking off regular lower body clothing?: A Lot 6 Click Score: 16    End  of Session Equipment Utilized During Treatment: Rolling walker (2 wheels);Oxygen  OT Visit Diagnosis: Other abnormalities of gait and mobility (R26.89);Muscle weakness (generalized) (M62.81)   Activity Tolerance Patient tolerated treatment well;Patient limited by fatigue   Patient Left in chair;with call bell/phone within reach   Nurse Communication Mobility status        Time: 6578-4696 OT Time Calculation (min): 37 min  Charges: OT General Charges $OT Visit: 1 Visit OT Treatments $Self Care/Home Management : 23-37 mins  Anitra Barn, OTA Acute Rehabilitation Services  Office [phone]   Jovita Nipper 09/16/2023, 1:00 PM

## 2023-09-16 NOTE — Progress Notes (Signed)
 Attempted to call report to facility 3 times; no answer.

## 2023-09-16 NOTE — Progress Notes (Signed)
 Assisting patient's Rn with discharging the patient. According to patient's RN she has attempted four times to call report to receiving facility with no response. Explained discharge instructions to patient. Reviewed follow up appointment and next medication administration times. Also reviewed education. Patient verbalized having an understanding for instructions given. All belongings are in the patient's possession. Patient verbalized that her Eliquis  starter kit is at her home. She said she will have someone to take it to [facility]. Checked with unit's case management and SW team to make sure the patient doesn't new TOC to send a starter kit with another starter kit. SW informed the facility of the patient's meds and there was no concerns. Was told it was ok to allow [facility] to transport the patient to SNF. Patient was in agreement and had not concerns. IV and telemetry were removed by the patient's NT. Aaron Aas No other needs verbalized. Transported to SNF via [facility]

## 2023-09-16 NOTE — TOC Transition Note (Signed)
 Transition of Care [facility]) - Discharge Note   Patient Details  Name: [name] MRN: [id number] Date of Birth: 08-14-1955  Transition of Care [facility]) CM/SW Contact:  Arron Big, LCSWA Phone Number: 09/16/2023, 10:40 AM   Clinical Narrative:   Patient will DC to: [facility] Anticipated DC date: 09/16/23  Family notified: Patient notified  Transport by: Lyna Sandhoff   Per MD patient ready for DC to [facility]. RN to call report prior to discharge [phone]; room 144 - alternative number in case first one does not work [phone] ). RN, patient, patient's family, and facility notified of DC. Discharge Summary and FL2 sent to facility. DC packet on chart. Ambulance transport requested for patient 10:40AM.   CSW will sign off for now as social work intervention is no longer needed. Please consult us  again if new needs arise.      Final next level of care: Skilled Nursing Facility Barriers to Discharge: Barriers Resolved   Patient Goals and CMS Choice Patient states their goals for this hospitalization and ongoing recovery are:: Will go to a SNF now   Choice offered to / list presented to : Patient      Discharge Placement              Patient chooses bed at: Other - please specify in the comment section below: [facility]) Patient to be transferred to facility by: [facility] Name of family member notified: Paitent notified Patient and family notified of of transfer: 09/16/23  Discharge Plan and Services Additional resources added to the After Visit Summary for   In-house Referral: Clinical Social Work   Post Acute Care Choice: Skilled Nursing Facility          DME Arranged: Oxygen DME Agency: NA                  Social Drivers of Health (SDOH) Interventions SDOH Screenings   Food Insecurity: No Food Insecurity (09/12/2023)  Housing: Low Risk  (09/12/2023)  Transportation Needs: No Transportation Needs (09/12/2023)  Utilities: Not At Risk (09/12/2023)  Social  Connections: Socially Isolated (09/12/2023)  Tobacco Use: Medium Risk (09/12/2023)     Readmission Risk Interventions     No data to display

## 2023-09-17 NOTE — Progress Notes (Signed)
 Cardiology Office Note    Date:  09/18/2023  ID:  KANIJA REMMEL, DOB 09-Sep-1955, MRN [id number] PCP:  Edda Goo, MD  Cardiologist:  Euell Herrlich, MD  (Dr. Micael Adas was listed but only previously supplied sleep study, last clinic OV 2021, most recently seen by Dr. Chancy Comber - anticipate primary ongoing follow-up with [facility] Electrophysiologist:  None   Chief Complaint: post-hospital follow-up  History of Present Illness: .    [name] is a 68 y.o. 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  She has had multiple prior VTE events. Per notes she had IVC filter placed in 2011 for DVTs after having a retinal hemorrhage. In more recent years she was on warfarin due to cost. In 2020 she had bradycardia with syncope and received a dual-chamber Medtronic PPM. HF clinic notes indicate that Afib has been deemed chronic, was in AF in 2021, 2023 (more recently difficult to discern on Vpaced tracings). She has also been managed for chronic HFpEF. She had a right heart cath in 2020 that showed minimal pulmonary hypertension, and high cardiac output with a suspected peripheral shunt. PYP scan 12/2018 was not convincing for amyloidosis. SGLT2i has been deferred due to concern over hygiene/incontinence. Cardiomems was considered in 2023 by the AHF team but she was lost to follow-up. 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 LE  venous duplex 09/04/23 showed + L DVT and SVT. 2D echo 09/04/23 EF 60-65%, moderate LVH, elevated LAP, mildly dilated LV, moderate BAE, aortic sclerosis without stenosis. She was diuresed.  R/LHC 09/07/23 1.  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 2.  No obstructive coronary artery disease. 3.  Fick cardiac output of 5.9 L/min and Fick cardiac index of 2.7 L/min/m with the following hemodynamics:            Right atrial pressure mean of 13 mmHg            Right ventricular pressure 64/2 with an end-diastolic pressure of 14 mmHg            PA pressure 62/18 with a mean of 37 mmHg            Wedge pressure mean of 11 mmHg with V waves to 22 mmHg            Pulmonary vascular resistance of 4.4 Woods units            PA pulsatility index of 3.4 4.  LVEDP of 23 mmHg Recommendation: Resting hypercapnia and hypoxia during this procedure with an arterial blood gas demonstrating a pCO2 of and a PO2 of in conjunction with elevated PVR may suggest group 3 pulmonary hypertension.     Elevated troponin was felt due to demand ischemia. No additional imaging was recommended for the anomalous course. 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 She was transitioned to Eliquis  as this was now deemed no longer cost prohibitive. Per  6/3 DC summary, There was lack of consensus among cardiopulmonary specialists, but given RHC findings and effects of diuresis earlier in hospitalization, in my medical opinion, aggressive diuresis may cause more harm than potential benefit, and we recommended home O2, palliative care referral, resumption of home torsemide , compliance with CPAP, and outpatient follow up. She was readmitted 6/6-6/11/25 after running out of her O2 (empty tank per patient) and possibly not taking torsemide  after original discharge. She was diuresed once more, placed back on home O2, and seen by palliative care with  plan for DNR and outpatient palliative follow-up.  She returns for follow-up today overall feeling stable. She continues with chronic appearing edema - she can't remember the last time it ever went fully down. She reports she feels a hundred times better than she did before these hospitalization. She states she plans to f/u with her urologist as she had a kidney mass found end of last year but unable to get MRI due to device so CT planned but had not yet been able to obtain due to other issues. She also plans to contact her nephrologist for follow-up. She is wondering who is managing her pacemaker.   Labwork independently reviewed: 09/2023 K 4.2, Cr 2.16, Hgb 9.1, plt 85, alb 3.4, AST ALT OK 08/2023 ddimer up, LDL 44, trig 56, Mg 2.0, trop 123-131  ROS: .    Please see the history of present illness.  All other systems are reviewed and otherwise negative.  Studies Reviewed: Aaron Aas    EKG:  EKG is not ordered today, reviewed from recent tracing - Vpaced 50bpm  CV Studies: Cardiac studies reviewed are outlined and summarized above. Otherwise please see EMR for full report.   Current Reported Medications:.    Current Meds  Medication Sig   allopurinol  (ZYLOPRIM ) 300 MG tablet Take 300 mg by mouth daily.   amLODipine  (NORVASC ) 5 MG tablet Take 1 tablet (5 mg total) by mouth daily.   APIXABAN  (ELIQUIS ) VTE STARTER PACK (10MG  AND 5MG ) Take as directed on package: take 10mg  twice daily through 6/11. On 6/12, decrease to 5mg  twice daily.   Biotin w/ Vitamins C & E (HAIR SKIN & NAILS GUMMIES PO) Take 2 each by mouth daily at 4 PM.   citalopram  (CELEXA ) 20 MG tablet Take 20 mg by mouth at bedtime.   diazepam  (VALIUM ) 5 MG tablet Take 1 tablet (5 mg total) by mouth 2 (two) times daily as needed for anxiety or muscle spasms.   diphenhydramine -acetaminophen  (TYLENOL  PM) 25-500 MG TABS tablet Take 1 tablet by mouth at bedtime.   Ferrous Sulfate  (IRON  PO) Take 1 tablet by mouth daily.   insulin  aspart  (NOVOLOG ) 100 UNIT/ML injection Inject 6 Units into the skin 3 (three) times daily with meals.   insulin  glargine (LANTUS  SOLOSTAR) 100 UNIT/ML Solostar Pen Inject 20 Units into the skin daily.   levothyroxine  (SYNTHROID ) 75 MCG tablet Take 75 mcg by mouth daily before breakfast.    MELATONIN PO Take 2 each by mouth at bedtime. Melatonin gummies   Multiple Vitamins-Minerals (CENTRUM SILVER) CHEW Chew 2 each by mouth daily.   rosuvastatin  (CRESTOR ) 10 MG tablet Take 1 tablet (10 mg total) by mouth daily.   torsemide  (DEMADEX ) 20 MG tablet Take 2 tablets (40mg ) by mouth twice daily, in the morning and at 4:00pm    Physical Exam:    VS:  BP 130/80   Pulse (!) 51   Ht 5' 1 (1.549 m)   Wt 289 lb 3.2  oz (131.2 kg)   SpO2 96%   BMI 54.64 kg/m    Wt Readings from Last 3 Encounters:  09/18/23 289 lb 3.2 oz (131.2 kg)  09/16/23 287 lb 11.2 oz (130.5 kg)  09/08/23 297 lb 12.8 oz (135.1 kg)    GEN: Chronically ill appearing WF on  home O2 in no acute distress NECK: No JVD; No carotid bruits CARDIAC: RRR, no murmurs, rubs, gallops RESPIRATORY:  Clear to auscultation without rales, wheezing or rhonchi  ABDOMEN: Soft, non-tender, non-distended EXTREMITIES:  Chronic appearing stiff lymphedema with chronic venous stasis changes; No acute deformity. Right wrist cath site without complication.  Asessement and Plan:.    1. Chronic HFpEF with pulmonary HTN - volume status appears stable. It is challenging to discern given her obesity and chronic stasis changes but clinically weight is relatively stable and she feels a hundred times better than the issues that brought her in recently. I would not make any new medication changes today. Check BMET, CBC today. I anticipate she would best be followed by the Advanced HF team going forward. She was previously established with them in 2023 and then lost to follow-up. She sees them 10/06/23. In the interim, encourage compliance with CPAP if able, adherence to home  O2, participation in physical therapy, compression hose and Unna boots as able. Hospital palliative team had referred for palliative care outpatient and I agree with this recommendation for focus on symptom management going forward.  2. CKD stage IV with anemia - recheck BMET today. Patient plans to call nephrology for appointment. I will also check her Hgb today. Recommend ongoing f/u of these issues by primary care, nephrology. Very complex patient. Also has a kidney mass pending evaluation.  3. Recurrent VTE - transitioned to Eliquis  earlier this month. Continue anticoagulation as per outlined in prescription.  4. Chronic atrial fibrillation with hx of 2nd degree AVB s/p PPM, LBBB - she is overdue for PPM follow-up. Her baseline rate remains quite flat around 50bpm. I am wondering if there is anything that can be optimized from a device standpoint as well. Will refer to EP to re-establish care. Dr. Arlester Ladd has been tracking her device interrogations. She is anticoagulated as above for both VTE and AF.    Disposition: F/u with AHF clinic 10/06/23, refer to EP for PPM management, and 6 months with general cardiology team.  Signed, Lahari Suttles N Sedra Morfin, PA-C

## 2023-09-18 ENCOUNTER — Ambulatory Visit: Attending: Physician Assistant | Admitting: Physician Assistant

## 2023-09-18 ENCOUNTER — Encounter: Payer: Self-pay | Admitting: Physician Assistant

## 2023-09-18 VITALS — BP 130/80 | HR 51 | Ht 61.0 in | Wt 289.2 lb

## 2023-09-18 DIAGNOSIS — I5032 Chronic diastolic (congestive) heart failure: Secondary | ICD-10-CM

## 2023-09-18 DIAGNOSIS — I82409 Acute embolism and thrombosis of unspecified deep veins of unspecified lower extremity: Secondary | ICD-10-CM

## 2023-09-18 DIAGNOSIS — I480 Paroxysmal atrial fibrillation: Secondary | ICD-10-CM

## 2023-09-18 DIAGNOSIS — I482 Chronic atrial fibrillation, unspecified: Secondary | ICD-10-CM | POA: Diagnosis not present

## 2023-09-18 DIAGNOSIS — Z950 Presence of cardiac pacemaker: Secondary | ICD-10-CM

## 2023-09-18 DIAGNOSIS — I447 Left bundle-branch block, unspecified: Secondary | ICD-10-CM | POA: Diagnosis not present

## 2023-09-18 NOTE — Patient Instructions (Signed)
 Medication Instructions:  Your physician recommends that you continue on your current medications as directed. Please refer to the Current Medication list given to you today.  *If you need a refill on your cardiac medications before your next appointment, please call your pharmacy*  Lab Work: TODAY: BMET, CBC If you have labs (blood work) drawn today and your tests are completely normal, you will receive your results only by: MyChart Message (if you have MyChart) OR A paper copy in the mail If you have any lab test that is abnormal or we need to change your treatment, we will call you to review the results.  Follow-Up: At [facility], you and your health needs are our priority.  As part of our continuing mission to provide you with exceptional heart care, our providers are all part of one team.  This team includes your primary Cardiologist (physician) and Advanced Practice Providers or APPs (Physician Assistants and Nurse Practitioners) who all work together to provide you with the care you need, when you need it.  Your next appointment:   6 month(s)  Provider:   Dayna Dunn, PA-C or Gayatri Acharya, MD We recommend signing up for the patient portal called MyChart.  Sign up information is provided on this After Visit Summary.  MyChart is used to connect with patients for Virtual Visits (Telemedicine).  Patients are able to view lab/test results, encounter notes, upcoming appointments, etc.  Non-urgent messages can be sent to your provider as well.   To learn more about what you can do with MyChart, go to [email or web address].   Other Instructions You have been referred to our electrophysiologist Dr. Arlester Ladd. A scheduler will call you to schedule an appointment.

## 2023-09-19 LAB — BASIC METABOLIC PANEL WITH GFR
BUN/Creatinine Ratio: 38 — ABNORMAL HIGH (ref 12–28)
BUN: 82 mg/dL (ref 8–27)
CO2: 23 mmol/L (ref 20–29)
Calcium: 9.4 mg/dL (ref 8.7–10.3)
Chloride: 103 mmol/L (ref 96–106)
Creatinine, Ser: 2.17 mg/dL — ABNORMAL HIGH (ref 0.57–1.00)
Glucose: 162 mg/dL — ABNORMAL HIGH (ref 70–99)
Potassium: 4.6 mmol/L (ref 3.5–5.2)
Sodium: 143 mmol/L (ref 134–144)
eGFR: 24 mL/min/{1.73_m2} — ABNORMAL LOW (ref >59)

## 2023-09-19 LAB — CBC
Hematocrit: 34.5 % (ref 34.0–46.6)
Hemoglobin: 10.6 g/dL — ABNORMAL LOW (ref 11.1–15.9)
MCH: 32.8 pg (ref 26.6–33.0)
MCHC: 30.7 g/dL — ABNORMAL LOW (ref 31.5–35.7)
MCV: 107 fL — ABNORMAL HIGH (ref 79–97)
Platelets: 133 10*3/uL — ABNORMAL LOW (ref 150–450)
RBC: 3.23 x10E6/uL — ABNORMAL LOW (ref 3.77–5.28)
RDW: 13.2 % (ref 11.7–15.4)
WBC: 7.8 10*3/uL (ref 3.4–10.8)

## 2023-09-20 ENCOUNTER — Ambulatory Visit: Payer: Self-pay | Admitting: Physician Assistant

## 2023-09-29 ENCOUNTER — Encounter: Admitting: Cardiovascular Disease

## 2023-09-29 ENCOUNTER — Other Ambulatory Visit: Payer: Self-pay

## 2023-09-29 ENCOUNTER — Encounter (HOSPITAL_COMMUNITY): Payer: Self-pay

## 2023-09-29 ENCOUNTER — Emergency Department (HOSPITAL_COMMUNITY)
Admission: EM | Admit: 2023-09-29 | Discharge: 2023-09-29 | Disposition: A | Discharge status: Home / Self Care | Attending: Emergency Medicine | Admitting: Emergency Medicine

## 2023-09-29 ENCOUNTER — Emergency Department (HOSPITAL_COMMUNITY)

## 2023-09-29 DIAGNOSIS — Z79899 Other long term (current) drug therapy: Secondary | ICD-10-CM | POA: Insufficient documentation

## 2023-09-29 DIAGNOSIS — S81801A Unspecified open wound, right lower leg, initial encounter: Secondary | ICD-10-CM | POA: Insufficient documentation

## 2023-09-29 DIAGNOSIS — S91302A Unspecified open wound, left foot, initial encounter: Secondary | ICD-10-CM | POA: Insufficient documentation

## 2023-09-29 DIAGNOSIS — D649 Anemia, unspecified: Secondary | ICD-10-CM | POA: Insufficient documentation

## 2023-09-29 DIAGNOSIS — I110 Hypertensive heart disease with heart failure: Secondary | ICD-10-CM | POA: Diagnosis not present

## 2023-09-29 DIAGNOSIS — S81802A Unspecified open wound, left lower leg, initial encounter: Secondary | ICD-10-CM | POA: Diagnosis not present

## 2023-09-29 DIAGNOSIS — X58XXXA Exposure to other specified factors, initial encounter: Secondary | ICD-10-CM | POA: Insufficient documentation

## 2023-09-29 DIAGNOSIS — M79605 Pain in left leg: Secondary | ICD-10-CM | POA: Diagnosis present

## 2023-09-29 DIAGNOSIS — Z794 Long term (current) use of insulin: Secondary | ICD-10-CM | POA: Insufficient documentation

## 2023-09-29 DIAGNOSIS — Z7901 Long term (current) use of anticoagulants: Secondary | ICD-10-CM | POA: Insufficient documentation

## 2023-09-29 DIAGNOSIS — E119 Type 2 diabetes mellitus without complications: Secondary | ICD-10-CM | POA: Insufficient documentation

## 2023-09-29 DIAGNOSIS — I509 Heart failure, unspecified: Secondary | ICD-10-CM | POA: Insufficient documentation

## 2023-09-29 LAB — CBC WITH DIFFERENTIAL/PLATELET
Abs Immature Granulocytes: 0.03 10*3/uL (ref 0.00–0.07)
Basophils Absolute: 0 10*3/uL (ref 0.0–0.1)
Basophils Relative: 1 %
Eosinophils Absolute: 0.2 10*3/uL (ref 0.0–0.5)
Eosinophils Relative: 3 %
HCT: 35 % — ABNORMAL LOW (ref 36.0–46.0)
Hemoglobin: 10.3 g/dL — ABNORMAL LOW (ref 12.0–15.0)
Immature Granulocytes: 0 %
Lymphocytes Relative: 7 %
Lymphs Abs: 0.5 10*3/uL — ABNORMAL LOW (ref 0.7–4.0)
MCH: 32.3 pg (ref 26.0–34.0)
MCHC: 29.4 g/dL — ABNORMAL LOW (ref 30.0–36.0)
MCV: 109.7 fL — ABNORMAL HIGH (ref 80.0–100.0)
Monocytes Absolute: 0.7 10*3/uL (ref 0.1–1.0)
Monocytes Relative: 10 %
Neutro Abs: 5.5 10*3/uL (ref 1.7–7.7)
Neutrophils Relative %: 79 %
Platelets: 120 10*3/uL — ABNORMAL LOW (ref 150–400)
RBC: 3.19 MIL/uL — ABNORMAL LOW (ref 3.87–5.11)
RDW: 14.6 % (ref 11.5–15.5)
WBC: 6.9 10*3/uL (ref 4.0–10.5)
nRBC: 0 % (ref 0.0–0.2)

## 2023-09-29 LAB — BASIC METABOLIC PANEL WITH GFR
Anion gap: 12 (ref 5–15)
BUN: 68 mg/dL — ABNORMAL HIGH (ref 8–23)
CO2: 23 mmol/L (ref 22–32)
Calcium: 9.3 mg/dL (ref 8.9–10.3)
Chloride: 104 mmol/L (ref 98–111)
Creatinine, Ser: 1.97 mg/dL — ABNORMAL HIGH (ref 0.44–1.00)
GFR, Estimated: 27 mL/min — ABNORMAL LOW (ref >60)
Glucose, Bld: 136 mg/dL — ABNORMAL HIGH (ref 70–99)
Potassium: 4.2 mmol/L (ref 3.5–5.1)
Sodium: 139 mmol/L (ref 135–145)

## 2023-09-29 MED ORDER — CEPHALEXIN 250 MG PO CAPS
500.0000 mg | ORAL_CAPSULE | Freq: Once | ORAL | Status: AC
  Administered 2023-09-29: 500 mg via ORAL
  Filled 2023-09-29: qty 2

## 2023-09-29 MED ORDER — OXYCODONE-ACETAMINOPHEN 5-325 MG PO TABS
1.0000 | ORAL_TABLET | Freq: Once | ORAL | Status: AC
  Administered 2023-09-29: 1 via ORAL
  Filled 2023-09-29: qty 1

## 2023-09-29 MED ORDER — CEPHALEXIN 500 MG PO CAPS: 500.0000 mg | ORAL_CAPSULE | Freq: Two times a day (BID) | ORAL | 0 refills | Status: AC

## 2023-09-29 NOTE — ED Notes (Signed)
 PT was discharged back to [facility] via [facility]. NOT [facility].

## 2023-09-29 NOTE — ED Notes (Signed)
Called ptar for pt pick up

## 2023-09-29 NOTE — Discharge Instructions (Signed)
 You were seen in the emergency department for your lower extremity wounds.  You did not appear infected today but due to the large nature of the wounds they are at risk of getting infected and I have given you a short course of antibiotics to prevent infection.  You should continue current dressing changes daily and I have given you wound care to follow-up with as an outpatient for long-term management.  You should have your wounds rechecked by your primary doctor in the next few days.  You should return to the emergency department if you have streaking redness up your legs, fevers, pus draining from your wounds or any other new or concerning symptoms.

## 2023-09-29 NOTE — ED Triage Notes (Signed)
 Pt to ED via [facility] from [facility]. Pt has wounds on her bilateral lower legs and left leg has spread to her left foot. Pt states she has had intermittent infections on her lower extremities for years but has never had blisters on her left foot. Nurse at SNF told her the foot was looking worse today. Pt denies pain, states she has intermittent aches at times.   152/78 50 18 94% 2LNC

## 2023-09-29 NOTE — ED Notes (Signed)
 Replaced pt oxygen tank

## 2023-09-29 NOTE — ED Provider Notes (Signed)
  EMERGENCY DEPARTMENT AT [facility] Provider Note   CSN: [id number] Arrival date & time: 09/29/23  9283     Patient presents with: Wound Infection   [name] is a 68 y.o. female.   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  Patient states that she has had wounds to her legs for the last few weeks that have been cared for by her nursing home staff.  She states that her legs started blistering and that the blisters popped.  She states that she has her dressings changed daily and when the nurses changed her dressings this morning they were concerned that her wounds look significantly worse and recommended that she come to the ED.  She states that she has not noticed any pus drainage or any redness going up her legs.  She states that she has had no fevers.  She states that she does have some pain in the left leg around the wound.  The history is provided by the patient.       Prior to Admission medications   Medication Sig Start Date End Date Taking? Authorizing Provider  cephALEXin (KEFLEX) 500 MG capsule Take 1 capsule (500 mg total) by mouth 2 (two) times daily for 5 days. 09/29/23 10/04/23 Yes Kingsley, Cree Kunert K, DO  allopurinol  (ZYLOPRIM ) 300 MG tablet Take 300 mg by mouth daily.    [provider]  amLODipine  (NORVASC ) 5 MG tablet Take 1 tablet (5 mg total) by mouth daily. 09/08/23 09/07/24  Jonel Lonni SQUIBB, MD  APIXABAN  (ELIQUIS ) VTE STARTER PACK (10MG  AND 5MG ) Take as directed on package: take 10mg  twice daily through 6/11. On 6/12, decrease to 5mg  twice daily. 09/15/23   Joseph, Preetha, MD  Biotin w/ Vitamins C & E (HAIR SKIN & NAILS GUMMIES PO) Take 2 each by mouth daily at 4 PM.    [provider]  citalopram  (CELEXA ) 20 MG tablet Take 20 mg by mouth at bedtime.    [provider]   diazepam  (VALIUM ) 5 MG tablet Take 1 tablet (5 mg total) by mouth 2 (two) times daily as needed for anxiety or muscle spasms. 09/16/23   Arrien, Mauricio Daniel, MD  diphenhydramine -acetaminophen  (TYLENOL  PM) 25-500 MG TABS tablet Take 1 tablet by mouth at bedtime.    [provider]  Ferrous Sulfate  (IRON  PO) Take 1 tablet by mouth daily.    [provider]  insulin  aspart (NOVOLOG ) 100 UNIT/ML injection Inject 6 Units into the skin 3 (three) times daily with meals. 09/15/23   Fairy Frames, MD  insulin  glargine (LANTUS  SOLOSTAR) 100 UNIT/ML Solostar Pen Inject 20 Units into the skin daily.    [provider]  levothyroxine  (SYNTHROID ) 75 MCG tablet Take 75 mcg by mouth daily before breakfast.     [provider]  MELATONIN PO Take 2 each by mouth at bedtime. Melatonin gummies    [provider]  Multiple Vitamins-Minerals (CENTRUM SILVER) CHEW Chew 2 each by mouth daily.    [provider]  rosuvastatin  (CRESTOR ) 10 MG tablet Take 1 tablet (10 mg total) by mouth daily. 05/09/16   Epifanio Rolland Robin, MD  torsemide  (DEMADEX ) 20 MG tablet Take 2 tablets (40mg ) by mouth twice daily, in the morning and at 4:00pm 09/08/23   Danford, Lonni SQUIBB, MD    Allergies: Ms contin [morphine]  Review of Systems  Updated Vital Signs BP (!) 127/50   Pulse (!) 49   Temp 97.8 F (36.6 C)   Resp 19   Ht 5' 1 (1.549 m)   Wt 131 kg   SpO2 100%   BMI 54.57 kg/m   Physical Exam Vitals and nursing note reviewed.  Constitutional:      General: She is not in acute distress.    Appearance: Normal appearance. She is obese.  HENT:     Head: Normocephalic and atraumatic.     Nose: Nose normal.     Mouth/Throat:     Mouth: Mucous membranes are moist.   Eyes:     Extraocular Movements: Extraocular movements intact.     Conjunctiva/sclera: Conjunctivae normal.    Cardiovascular:     Rate and Rhythm: Normal rate and regular rhythm.      Pulses: Normal pulses.     Heart sounds: Normal heart sounds.  Pulmonary:     Effort: Pulmonary effort is normal.     Breath sounds: Normal breath sounds.  Abdominal:     General: Abdomen is flat.   Musculoskeletal:        General: Normal range of motion.     Cervical back: Normal range of motion.     Right lower leg: Edema (1+ to shin) present.     Left lower leg: Edema (1+ to shin) present.   Skin:    General: Skin is warm and dry.     Comments: Small stage 2 wound to R shin, no surrounding erythema, warmth or drainage Large ~8x8 cm stage 2 wound to L lower leg, no surrounding erythema, warmth, small amount of blood oozing from posterior aspect, no purulent drainage ~3x4 cm stage 2 wound to L dorsum of foot, no surrounding erythema, warmth or drainage with peeling blistered skin   Neurological:     General: No focal deficit present.     Mental Status: She is alert and oriented to person, place, and time.   Psychiatric:        Mood and Affect: Mood normal.        Behavior: Behavior normal.          (all labs ordered are listed, but only abnormal results are displayed) Labs Reviewed  CBC WITH DIFFERENTIAL/PLATELET - Abnormal; Notable for the following components:      Result Value   RBC 3.19 (*)    Hemoglobin 10.3 (*)    HCT 35.0 (*)    MCV 109.7 (*)    MCHC 29.4 (*)    Platelets 120 (*)    Lymphs Abs 0.5 (*)    All other components within normal limits  BASIC METABOLIC PANEL WITH GFR - Abnormal; Notable for the following components:   Glucose, Bld 136 (*)    BUN 68 (*)    Creatinine, Ser 1.97 (*)    GFR, Estimated 27 (*)    All other components within normal limits    EKG: None  Radiology: DG Tibia/Fibula Left Result Date: 09/29/2023 CLINICAL DATA:  Bilateral lower extremity wounds. EXAM: LEFT TIBIA AND FIBULA - 2 VIEW COMPARISON:  Left knee radiographs dated 08/31/2014. FINDINGS: Bandaging along the right medial calf likely corresponds to site of reported  wound. No definite soft tissue gas is identified. Generalized nonspecific subcutaneous edema of the left lower extremity. Vascular calcifications are noted. No acute fracture or dislocation. Severe lateral femorotibial compartment joint space narrowing resulting in bone-on-bone contact, subchondral sclerosis, and marginal osteophytosis. IMPRESSION:  1. Bandaging along the right medial calf likely corresponds to site of reported wound. No definite soft tissue gas is identified. Generalized nonspecific subcutaneous edema of the left lower extremity. Cellulitis cannot be excluded. 2. No acute osseous abnormality. 3. Severe lateral femorotibial compartment osteoarthritis resulting in bone-on-bone contact. Electronically Signed   By: Harrietta Sherry M.D.   On: 09/29/2023 09:18   DG Foot 2 Views Left Result Date: 09/29/2023 CLINICAL DATA:  Left foot wound/blistering. EXAM: LEFT FOOT - 2 VIEW COMPARISON:  Left foot radiographs dated 10/15/2011. FINDINGS: Bandaging in place along the dorsal foot likely corresponds to site of reported blistering/wound. No definite soft tissue gas is identified. Generalized nonspecific subcutaneous edema of the foot. Diffuse osseous demineralization. No acute fracture or dislocation. Calcaneal enthesopathy. 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 IMPRESSION: 1. Bandaging in place along the dorsal foot likely corresponds to site of reported blistering/wound. No definite soft tissue gas is identified. Generalized nonspecific subcutaneous edema of the foot. 2. No acute osseous abnormality. 3. 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 Electronically Signed   By: Harrietta Sherry M.D.   On: 09/29/2023 09:16   DG Tibia/Fibula Right Result Date: 09/29/2023 CLINICAL DATA:  Bilateral lower extremity wounds. EXAM: RIGHT TIBIA AND FIBULA - 2 VIEW COMPARISON:  Right knee radiographs dated 01/07/2023. FINDINGS: Bandaging with cutaneous irregularity along  the mid anterior shin likely corresponds to site of wound. No definite soft tissue gas. Generalized nonspecific subcutaneous edema. Vascular calcifications are noted. No acute fracture or dislocation. Severe medial femoral tibial compartment osteoarthritis with bone-on-bone contact, subchondral sclerosis, and marginal osteophytosis. Chondrocalcinosis in the lateral femorotibial compartment. IMPRESSION: 1. Bandaging with cutaneous irregularity along the mid anterior shin likely corresponds to site of wound. No definite soft tissue gas. Generalized nonspecific subcutaneous edema. Cellulitis can not be excluded. 2. Severe medial femorotibial compartment osteoarthritis. 3. No acute osseous abnormality. Electronically Signed   By: Harrietta Sherry M.D.   On: 09/29/2023 09:12     Procedures   Medications Ordered in the ED  cephALEXin (KEFLEX) capsule 500 mg (has no administration in time range)  oxyCODONE -acetaminophen  (PERCOCET/ROXICET) 5-325 MG per tablet 1 tablet (1 tablet Oral Given 09/29/23 0932)    Clinical Course as of 09/29/23 1145  Tue Sep 29, 2023  0934 No signs of deep space infection on XR. [VK]  1043 Anemia at baseline. [VK]  1109 Cr at baseline, no signs of sepsis. With large wounds and hx of diabetes, will be given short course of abx and recommended outpatient wound care follow up. [VK]    Clinical Course User Index [VK] Kingsley, Elverta Dimiceli K, DO                                 Medical Decision Making This patient presents to the ED with chief complaint(s) of LE wounds with pertinent past medical history of CHF, HTN, DM, VTE which further complicates the presenting complaint. The complaint involves an extensive differential diagnosis and also carries with it a high risk of complications and morbidity.    The differential diagnosis includes LE wounds, no obvious cellulitis, no signs of abscess, low suspicion for osteo due to superficial nature of wounds, no palpable crepitus and  non-ill appearing making nec fasc unlikely, no signs of neurovascular injury  Additional history obtained: Additional history obtained from N/A Records reviewed previous admission documents and outpatient cardiology records  ED Course and Reassessment: On  patient's arrival she is hemodynamically stable in no acute distress.  Does have a large wounds to her left lower leg and a small wound to her right shin, they do not appear obviously infected on exam with no surrounding erythema or warmth however with her complex medical history and high risk of infection will have x-rays and labs performed to evaluate for possible deep space infection and she will be closely reassessed.  Independent labs interpretation:  The following labs were independently interpreted: at baseline, no acute abnormality  Independent visualization of imaging: - I independently visualized the following imaging with scope of interpretation limited to determining acute life threatening conditions related to emergency care: bilateral tib/fib XR, L foot XR, which revealed no acute deep space infection  Consultation: - Consulted or discussed management/test interpretation w/ external professional: N/A  Consideration for admission or further workup: Patient has no emergent conditions requiring admission or further work-up at this time and is stable for discharge home with primary care follow-up  Social Determinants of health: N/A    Amount and/or Complexity of Data Reviewed Labs: ordered. Radiology: ordered.  Risk Prescription drug management.       Final diagnoses:  Wound of left lower extremity, initial encounter  Leg wound, right, initial encounter    ED Discharge Orders          Ordered    cephALEXin (KEFLEX) 500 MG capsule  2 times daily        09/29/23 1145               Kingsley, Breland Elders K, DO 09/29/23 1145

## 2023-09-30 NOTE — ED Provider Notes (Signed)
 ADDENDUM: Critical care time filed for pt's Visit on 09/11/2023  .Critical Care  Performed by: Nivia Colon, PA-C Authorized by: Nivia Colon, PA-C   Critical care provider statement:    Critical care time (minutes):  30   Critical care was time spent personally by me on the following activities:  Development of treatment plan with patient or surrogate, discussions with consultants, evaluation of patient's response to treatment, examination of patient, ordering and review of laboratory studies, ordering and review of radiographic studies, ordering and performing treatments and interventions, pulse oximetry, re-evaluation of patient's condition and review of old charts     Nivia Colon, PA-C 09/30/23 1410    Dasie Faden, MD 10/19/23 [phone]

## 2023-10-04 ENCOUNTER — Emergency Department (HOSPITAL_COMMUNITY)

## 2023-10-04 ENCOUNTER — Emergency Department (HOSPITAL_COMMUNITY)
Admission: EM | Admit: 2023-10-04 | Discharge: 2023-10-04 | Disposition: A | Discharge status: Home / Self Care | Attending: Emergency Medicine | Admitting: Emergency Medicine

## 2023-10-04 ENCOUNTER — Other Ambulatory Visit: Payer: Self-pay

## 2023-10-04 ENCOUNTER — Encounter (HOSPITAL_COMMUNITY): Payer: Self-pay

## 2023-10-04 DIAGNOSIS — R059 Cough, unspecified: Secondary | ICD-10-CM | POA: Diagnosis present

## 2023-10-04 DIAGNOSIS — R600 Localized edema: Secondary | ICD-10-CM | POA: Insufficient documentation

## 2023-10-04 DIAGNOSIS — R0602 Shortness of breath: Secondary | ICD-10-CM | POA: Diagnosis not present

## 2023-10-04 DIAGNOSIS — R052 Subacute cough: Secondary | ICD-10-CM | POA: Insufficient documentation

## 2023-10-04 LAB — CBC WITH DIFFERENTIAL/PLATELET
Abs Immature Granulocytes: 0.02 10*3/uL (ref 0.00–0.07)
Basophils Absolute: 0 10*3/uL (ref 0.0–0.1)
Basophils Relative: 1 %
Eosinophils Absolute: 0.1 10*3/uL (ref 0.0–0.5)
Eosinophils Relative: 2 %
HCT: 30.9 % — ABNORMAL LOW (ref 36.0–46.0)
Hemoglobin: 9.4 g/dL — ABNORMAL LOW (ref 12.0–15.0)
Immature Granulocytes: 0 %
Lymphocytes Relative: 7 %
Lymphs Abs: 0.4 10*3/uL — ABNORMAL LOW (ref 0.7–4.0)
MCH: 33.1 pg (ref 26.0–34.0)
MCHC: 30.4 g/dL (ref 30.0–36.0)
MCV: 108.8 fL — ABNORMAL HIGH (ref 80.0–100.0)
Monocytes Absolute: 0.7 10*3/uL (ref 0.1–1.0)
Monocytes Relative: 12 %
Neutro Abs: 4.4 10*3/uL (ref 1.7–7.7)
Neutrophils Relative %: 78 %
Platelets: 143 10*3/uL — ABNORMAL LOW (ref 150–400)
RBC: 2.84 MIL/uL — ABNORMAL LOW (ref 3.87–5.11)
RDW: 14.5 % (ref 11.5–15.5)
WBC: 5.6 10*3/uL (ref 4.0–10.5)
nRBC: 0 % (ref 0.0–0.2)

## 2023-10-04 LAB — BASIC METABOLIC PANEL WITH GFR
Anion gap: 11 (ref 5–15)
BUN: 73 mg/dL — ABNORMAL HIGH (ref 8–23)
CO2: 23 mmol/L (ref 22–32)
Calcium: 9.2 mg/dL (ref 8.9–10.3)
Chloride: 103 mmol/L (ref 98–111)
Creatinine, Ser: 2.32 mg/dL — ABNORMAL HIGH (ref 0.44–1.00)
GFR, Estimated: 22 mL/min — ABNORMAL LOW (ref >60)
Glucose, Bld: 107 mg/dL — ABNORMAL HIGH (ref 70–99)
Potassium: 4.4 mmol/L (ref 3.5–5.1)
Sodium: 137 mmol/L (ref 135–145)

## 2023-10-04 LAB — RESP PANEL BY RT-PCR (RSV, FLU A&B, COVID)  RVPGX2
Influenza A by PCR: NEGATIVE
Influenza B by PCR: NEGATIVE
Resp Syncytial Virus by PCR: NEGATIVE
SARS Coronavirus 2 by RT PCR: NEGATIVE

## 2023-10-04 LAB — BRAIN NATRIURETIC PEPTIDE: B Natriuretic Peptide: 448.4 pg/mL — ABNORMAL HIGH (ref 0.0–100.0)

## 2023-10-04 LAB — TROPONIN I (HIGH SENSITIVITY): Troponin I (High Sensitivity): 134 ng/L (ref <18)

## 2023-10-04 MED ORDER — BENZONATATE 100 MG PO CAPS: 100.0000 mg | ORAL_CAPSULE | Freq: Three times a day (TID) | ORAL | 0 refills | Status: DC

## 2023-10-04 MED ORDER — OXYCODONE HCL 5 MG PO TABS
10.0000 mg | ORAL_TABLET | Freq: Once | ORAL | Status: AC
  Administered 2023-10-04: 10 mg via ORAL
  Filled 2023-10-04: qty 2

## 2023-10-04 MED ORDER — BENZONATATE 100 MG PO CAPS
100.0000 mg | ORAL_CAPSULE | Freq: Once | ORAL | Status: AC
  Administered 2023-10-04: 100 mg via ORAL
  Filled 2023-10-04: qty 1

## 2023-10-04 MED ORDER — FUROSEMIDE 10 MG/ML IJ SOLN
40.0000 mg | Freq: Once | INTRAMUSCULAR | Status: AC
  Administered 2023-10-04: 40 mg via INTRAVENOUS
  Filled 2023-10-04: qty 4

## 2023-10-04 NOTE — ED Triage Notes (Signed)
 Pt arrives EMS from [facility] with reports of shortness of breath and cough over the last couple days. Pt reports recent admission to rehab and feels like she has gotten worse. Pt reports chest pain with coughing.

## 2023-10-04 NOTE — ED Notes (Signed)
Called ptar for pt pick up

## 2023-10-04 NOTE — ED Notes (Signed)
 CCMD notified. Pt is on monitor.

## 2023-10-04 NOTE — ED Provider Notes (Signed)
 [place] [facility] Provider Note   CSN: [id number] Arrival date & time: 10/04/23  0459     History Chief Complaint  Patient presents with   Shortness of Breath   Cough    HPI [name] is a 68 y.o. female presenting for a myriad of chronic symptoms. She states that she been having shortness of breath, cough, skin lesions all present over the past few months.  Last night her cough kept her up so she came in for further care and management. Denies fevers or chest pain at this time.  States that her cough is very uncomfortable and causes some radiation into her thoracic cavity but her pain is not present when she is not coughing. Chronic wounds to lower extremities.  Follows with wound care. At a nursing facility due to her substantial medical problems. Shortness of breath on exertion that is her baseline.  She is on 2 L oxygen at baseline.  Patient's recorded medical, surgical, social, medication list and allergies were reviewed in the Snapshot window as part of the initial history.   Review of Systems   Review of Systems  Constitutional:  Negative for chills and fever.  HENT:  Negative for ear pain and sore throat.   Eyes:  Negative for pain and visual disturbance.  Respiratory:  Positive for cough and shortness of breath.   Cardiovascular:  Negative for chest pain and palpitations.  Gastrointestinal:  Negative for abdominal pain and vomiting.  Genitourinary:  Negative for dysuria and hematuria.  Musculoskeletal:  Negative for arthralgias and back pain.  Skin:  Negative for color change and rash.  Neurological:  Negative for seizures and syncope.  All other systems reviewed and are negative.   Physical Exam Updated Vital Signs BP (!) 147/45 (BP Location: Right Arm)   Pulse (!) 50   Temp 98.6 F (37 C)   Resp 16   SpO2 100%  Physical Exam Vitals and nursing note reviewed.  Constitutional:      General: She is not in acute  distress.    Appearance: She is well-developed.  HENT:     Head: Normocephalic and atraumatic.   Eyes:     Conjunctiva/sclera: Conjunctivae normal.    Cardiovascular:     Rate and Rhythm: Normal rate and regular rhythm.     Heart sounds: No murmur heard. Pulmonary:     Effort: Pulmonary effort is normal. No respiratory distress.     Breath sounds: Normal breath sounds. No rales.  Abdominal:     General: There is no distension.     Palpations: Abdomen is soft.     Tenderness: There is no abdominal tenderness. There is no right CVA tenderness or left CVA tenderness.   Musculoskeletal:        General: No swelling. Normal range of motion.     Cervical back: Neck supple.     Right lower leg: Tenderness present. Edema present.     Left lower leg: Tenderness present. Edema present.   Skin:    General: Skin is warm and dry.   Neurological:     General: No focal deficit present.     Mental Status: She is alert and oriented to person, place, and time. Mental status is at baseline.     Cranial Nerves: No cranial nerve deficit.      ED Course/ Medical Decision Making/ A&P    Procedures Procedures   Medications Ordered in ED Medications  oxyCODONE  (Oxy IR/ROXICODONE )  immediate release tablet 10 mg (10 mg Oral Given 10/04/23 0707)  furosemide  (LASIX ) injection 40 mg (40 mg Intravenous Given 10/04/23 0708)  benzonatate  (TESSALON ) capsule 100 mg (100 mg Oral Given 10/04/23 9292)    Medical Decision Making:   [name] is a 68 y.o. female who presented to the ED today with multiple comorbid medical problems detailed above.    Handoff received from EMS.  Patient placed on continuous vitals and telemetry monitoring while in ED which was reviewed periodically.  Complete initial physical exam performed, notably the patient  was HDS in NAD.    Reviewed and confirmed nursing documentation for past medical history, family history, social history.    Initial Assessment:   This is  most consistent with an acute life/limb threatening illness complicated by underlying chronic conditions. Patient has a myriad of complaints that are all subacute in nature.  There is no evidence of any acute pathology on her current history of present illness. Possible etiologies of her cough could be ongoing volume overload from her heart failure, ongoing obstructive disease in the setting of smoking history, secondary to her pulmonary artery hypertension. Potential etiologies of her thoracic pain are most consistent with musculoskeletal etiology from her cough.  Considered ACS, PE is grossly unlikely Initial Plan:  Screening labs including CBC and Metabolic panel to evaluate for infectious or metabolic etiology of disease.  CXR to evaluate for structural/infectious intrathoracic pathology.  EKG and single troponin to evaluate for cardiac pathology. Single troponin appropriate due to greater than 6 hours since maximal intensity of symptoms. BNP testing for heart failure exacerbation Objective evaluation as below reviewed   Initial Study Results:   Laboratory  All laboratory results reviewed without evidence of clinically relevant pathology.  Exceptions include:  Troponin flat with prior, BNP downtrending    EKG EKG was reviewed independently. Rate, rhythm, axis, intervals all examined and without medically relevant abnormality. ST segments without concerns for elevations.    Radiology:  All images reviewed independently. Agree with radiology report at this time.   DG Chest Portable 1 View Result Date: 10/04/2023 CLINICAL DATA:  Shortness of breath. EXAM: PORTABLE CHEST 1 VIEW COMPARISON:  Six/6/25 FINDINGS: Left chest wall single lead pacer. Stable cardiac enlargement. Aortic atherosclerosis. Pulmonary artery enlargement compatible with PA hypertension. Lung volumes are low. No pleural fluid, interstitial edema or airspace disease. Visualized osseous structures appear grossly intact.  IMPRESSION: 1. Low lung volumes. 2. Cardiomegaly and pulmonary artery enlargement compatible with PA hypertension. Electronically Signed   By: Waddell Calk M.D.   On: 10/04/2023 06:12   DG Tibia/Fibula Left Result Date: 09/29/2023 CLINICAL DATA:  Bilateral lower extremity wounds. EXAM: LEFT TIBIA AND FIBULA - 2 VIEW COMPARISON:  Left knee radiographs dated 08/31/2014. FINDINGS: Bandaging along the right medial calf likely corresponds to site of reported wound. No definite soft tissue gas is identified. Generalized nonspecific subcutaneous edema of the left lower extremity. Vascular calcifications are noted. No acute fracture or dislocation. Severe lateral femorotibial compartment joint space narrowing resulting in bone-on-bone contact, subchondral sclerosis, and marginal osteophytosis. IMPRESSION: 1. Bandaging along the right medial calf likely corresponds to site of reported wound. No definite soft tissue gas is identified. Generalized nonspecific subcutaneous edema of the left lower extremity. Cellulitis cannot be excluded. 2. No acute osseous abnormality. 3. Severe lateral femorotibial compartment osteoarthritis resulting in bone-on-bone contact. Electronically Signed   By: Harrietta Sherry M.D.   On: 09/29/2023 09:18   DG Foot 2 Views Left Result Date:  09/29/2023 CLINICAL DATA:  Left foot wound/blistering. EXAM: LEFT FOOT - 2 VIEW COMPARISON:  Left foot radiographs dated 10/15/2011. FINDINGS: Bandaging in place along the dorsal foot likely corresponds to site of reported blistering/wound. No definite soft tissue gas is identified. Generalized nonspecific subcutaneous edema of the foot. Diffuse osseous demineralization. No acute fracture or dislocation. Calcaneal enthesopathy. 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 IMPRESSION: 1. Bandaging in place along the dorsal foot likely corresponds to site of reported blistering/wound. No definite soft tissue gas is identified.  Generalized nonspecific subcutaneous edema of the foot. 2. No acute osseous abnormality. 3. 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 Electronically Signed   By: Harrietta Sherry M.D.   On: 09/29/2023 09:16   DG Tibia/Fibula Right Result Date: 09/29/2023 CLINICAL DATA:  Bilateral lower extremity wounds. EXAM: RIGHT TIBIA AND FIBULA - 2 VIEW COMPARISON:  Right knee radiographs dated 01/07/2023. FINDINGS: Bandaging with cutaneous irregularity along the mid anterior shin likely corresponds to site of wound. No definite soft tissue gas. Generalized nonspecific subcutaneous edema. Vascular calcifications are noted. No acute fracture or dislocation. Severe medial femoral tibial compartment osteoarthritis with bone-on-bone contact, subchondral sclerosis, and marginal osteophytosis. Chondrocalcinosis in the lateral femorotibial compartment. IMPRESSION: 1. Bandaging with cutaneous irregularity along the mid anterior shin likely corresponds to site of wound. No definite soft tissue gas. Generalized nonspecific subcutaneous edema. Cellulitis can not be excluded. 2. Severe medial femorotibial compartment osteoarthritis. 3. No acute osseous abnormality. Electronically Signed   By: Harrietta Sherry M.D.   On: 09/29/2023 09:12   DG Chest 2 View Result Date: 09/11/2023 CLINICAL DATA:  Shortness of breath EXAM: CHEST - 2 VIEW COMPARISON:  Sep 03, 2023 FINDINGS: Persistent bilateral pulmonary infiltrates correlate with bilateral congestive changes mild cardiomegaly correlate with CHF. No change in the left subclavian pacemaker device IMPRESSION: Mild CHF Electronically Signed   By: Franky Chard M.D.   On: 09/11/2023 11:32   CARDIAC CATHETERIZATION Result Date: 09/07/2023 1.  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 2.  No obstructive coronary artery disease. 3.  Fick cardiac output of 5.9 L/min and Fick cardiac index of 2.7  L/min/m with the following hemodynamics:  Right atrial pressure mean of 13 mmHg  Right ventricular pressure 64/2 with an end-diastolic pressure of 14 mmHg  PA pressure 62/18 with a mean of 37 mmHg  Wedge pressure mean of 11 mmHg with V waves to 22 mmHg  Pulmonary vascular resistance of 4.4 Woods units  PA pulsatility index of 3.4 4.  LVEDP of 23 mmHg Recommendation: Resting hypercapnia and hypoxia during this procedure with an arterial blood gas demonstrating a pCO2 of and a PO2 of in conjunction with elevated PVR may suggest group 3 pulmonary hypertension.    CT CHEST WO CONTRAST Result Date: 09/05/2023 CLINICAL DATA:  Dyspnea, chronic, unclear in etiology. Shortness of breath. EXAM: CT CHEST WITHOUT CONTRAST TECHNIQUE: Multidetector CT imaging of the chest was performed following the standard protocol without IV contrast. RADIATION DOSE REDUCTION: This exam was performed according to the departmental dose-optimization program which includes automated exposure control, adjustment of the mA and/or kV according to patient size and/or use of iterative reconstruction technique. COMPARISON:  09/14/2015. FINDINGS: Cardiovascular: The heart is enlarged and there is a trace pericardial effusion. A pacemaker lead is noted in the heart. There is calcification of the mitral valve annulus. Atherosclerotic calcification of the aorta is noted without evidence  of aneurysm. The pulmonary trunk is distended suggesting underlying pulmonary artery hypertension. Mediastinum/Nodes: No mediastinal or axillary lymphadenopathy. Evaluation of the hila is limited due to lack of IV contrast. The thyroid gland, trachea, and esophagus are within normal limits. Lungs/Pleura: There is a small right pleural effusion. Scattered ground-glass opacities are noted in the lungs bilaterally. Minimal atelectasis or infiltrate is present in the right lower lobe. No significant pulmonary nodule or mass is seen. Upper Abdomen: The  gallbladder is surgically absent. No acute abnormality. Musculoskeletal: A pacemaker device is present in the anterior left chest. Degenerative changes are present in the thoracic spine. No acute osseous abnormality is seen. IMPRESSION: 1. Scattered ground-glass opacities in the lungs bilaterally, possible edema or infiltrate. 2. Small right pleural effusion. 3. Distended pulmonary trunk suggesting underlying pulmonary artery hypertension. 4. Scattered coronary artery calcifications. 5. Aortic atherosclerosis. Electronically Signed   By: Leita Birmingham M.D.   On: 09/05/2023 17:12   ECHOCARDIOGRAM COMPLETE Result Date: 09/04/2023    ECHOCARDIOGRAM REPORT   Patient Name:   [name] Date of Exam: 09/04/2023 Medical Rec #:  [id number]        Height:       61.0 in Accession #:    [id number]       Weight:       286.8 lb Date of Birth:  07/18/1955        BSA:          2.202 m Patient Age:    67 years         BP:           162/53 mmHg Patient Gender: F                HR:           50 bpm. Exam Location:  Inpatient Procedure: 2D Echo, Cardiac Doppler, Color Doppler and Intracardiac            Opacification Agent (Both Spectral and Color Flow Doppler were            utilized during procedure). Indications:    CHF I50.9  History:        Patient has prior history of Echocardiogram examinations, most                 recent 09/09/2021. Risk Factors:Hypertension and Sleep Apnea.  Sonographer:    Benard Stallion Referring Phys: [id number] ZANE ADAMS IMPRESSIONS  1. Left ventricular ejection fraction, by estimation, is 60 to 65%. The left ventricle has normal function. The left ventricle has no regional wall motion abnormalities. The left ventricular internal cavity size was mildly dilated. There is moderate concentric left ventricular hypertrophy. Left ventricular diastolic function could not be evaluated. Elevated left atrial pressure.  2. Right ventricular systolic function is normal. The right ventricular size is normal.   3. Left atrial size was moderately dilated.  4. Right atrial size was moderately dilated.  5. The mitral valve is normal in structure. No evidence of mitral valve regurgitation. No evidence of mitral stenosis. Moderate to severe mitral annular calcification.  6. The aortic valve is tricuspid. There is mild calcification of the aortic valve. There is mild thickening of the aortic valve. Aortic valve regurgitation is not visualized. Aortic valve sclerosis/calcification is present, without any evidence of aortic stenosis.  7. The inferior vena cava is normal in size with greater than 50% respiratory variability, suggesting right atrial pressure of 3 mmHg. FINDINGS  Left Ventricle: Left ventricular ejection fraction,  by estimation, is 60 to 65%. The left ventricle has normal function. The left ventricle has no regional wall motion abnormalities. The left ventricular internal cavity size was mildly dilated. There is  moderate concentric left ventricular hypertrophy. Left ventricular diastolic function could not be evaluated due to mitral annular calcification (moderate or greater). Left ventricular diastolic function could not be evaluated. Elevated left atrial pressure. Right Ventricle: The right ventricular size is normal. No increase in right ventricular wall thickness. Right ventricular systolic function is normal. Left Atrium: Left atrial size was moderately dilated. Right Atrium: Right atrial size was moderately dilated. Pericardium: There is no evidence of pericardial effusion. Mitral Valve: The mitral valve is normal in structure. Moderate to severe mitral annular calcification. No evidence of mitral valve regurgitation. No evidence of mitral valve stenosis. Tricuspid Valve: The tricuspid valve is normal in structure. Tricuspid valve regurgitation is mild . No evidence of tricuspid stenosis. Aortic Valve: The aortic valve is tricuspid. There is mild calcification of the aortic valve. There is mild thickening of  the aortic valve. Aortic valve regurgitation is not visualized. Aortic valve sclerosis/calcification is present, without any evidence of aortic stenosis. Aortic valve mean gradient measures 6.5 mmHg. Aortic valve peak gradient measures 12.2 mmHg. Aortic valve area, by VTI measures 2.12 cm. Pulmonic Valve: The pulmonic valve was normal in structure. Pulmonic valve regurgitation is mild. No evidence of pulmonic stenosis. Aorta: The aortic root is normal in size and structure. Venous: The inferior vena cava is normal in size with greater than 50% respiratory variability, suggesting right atrial pressure of 3 mmHg. IAS/Shunts: No atrial level shunt detected by color flow Doppler. Additional Comments: A device lead is visualized.  LEFT VENTRICLE PLAX 2D LVIDd:         5.40 cm   Diastology LVIDs:         3.50 cm   LV e' medial:    5.43 cm/s LV PW:         1.30 cm   LV E/e' medial:  23.1 LV IVS:        1.30 cm   LV e' lateral:   6.87 cm/s LVOT diam:     2.10 cm   LV E/e' lateral: 18.3 LV SV:         92 LV SV Index:   42 LVOT Area:     3.46 cm  RIGHT VENTRICLE RV S prime:     13.30 cm/s TAPSE (M-mode): 2.1 cm LEFT ATRIUM              Index        RIGHT ATRIUM           Index LA Vol (A2C):   104.0 ml 47.24 ml/m  RA Area:     25.40 cm LA Vol (A4C):   85.4 ml  38.79 ml/m  RA Volume:   87.10 ml  39.56 ml/m LA Biplane Vol: 93.8 ml  42.61 ml/m  AORTIC VALVE                     PULMONIC VALVE AV Area (Vmax):    2.32 cm      PR End Diast Vel: 3.82 msec AV Area (Vmean):   2.18 cm AV Area (VTI):     2.12 cm AV Vmax:           174.50 cm/s AV Vmean:          119.000 cm/s AV VTI:  0.435 m AV Peak Grad:      12.2 mmHg AV Mean Grad:      6.5 mmHg LVOT Vmax:         117.00 cm/s LVOT Vmean:        74.900 cm/s LVOT VTI:          0.266 m LVOT/AV VTI ratio: 0.61  AORTA Ao Root diam: 3.00 cm Ao Asc diam:  3.50 cm MITRAL VALVE                TRICUSPID VALVE MV Area (PHT): 4.06 cm     TR Peak grad:   23.6 mmHg MV Decel Time:  187 msec     TR Vmax:        243.00 cm/s MV E velocity: 125.50 cm/s MV A velocity: 54.00 cm/s   SHUNTS MV E/A ratio:  2.32         Systemic VTI:  0.27 m                             Systemic Diam: 2.10 cm Morene Brownie Electronically signed by Morene Brownie Signature Date/Time: 09/04/2023/8:26:36 PM    Final    VAS US  LOWER EXTREMITY VENOUS (DVT) Result Date: 09/04/2023  Lower Venous DVT Study Patient Name:  [name]  Date of Exam:   09/04/2023 Medical Rec #: [id number]         Accession #:    [id number] Date of Birth: [birth year]         Patient Gender: F Patient Age:   15 years Exam Location:  [facility] Procedure:      VAS US  LOWER EXTREMITY VENOUS (DVT) Referring Phys: WADDELL DONATH --------------------------------------------------------------------------------  Indications: History of PE and DVTs. Other Indications: Discoloration of both lower extremities. Risk Factors: Obesity. Anticoagulation: Heparin . Limitations: Body habitus. Performing Technologist: Ricka Sturdivant-Jones RDMS, RVT  Examination Guidelines: A complete evaluation includes B-mode imaging, spectral Doppler, color Doppler, and power Doppler as needed of all accessible portions of each vessel. Bilateral testing is considered an integral part of a complete examination. Limited examinations for reoccurring indications may be performed as noted. The reflux portion of the exam is performed with the patient in reverse Trendelenburg.  +---------+---------------+---------+-----------+----------+---------------+ RIGHT    CompressibilityPhasicitySpontaneityPropertiesThrombus Aging  +---------+---------------+---------+-----------+----------+---------------+ CFV      Full           Yes      Yes                                  +---------+---------------+---------+-----------+----------+---------------+ SFJ      Full                                                          +---------+---------------+---------+-----------+----------+---------------+ FV Prox  Full                                                         +---------+---------------+---------+-----------+----------+---------------+ FV Mid   Full                                                         +---------+---------------+---------+-----------+----------+---------------+  FV DistalFull                                                         +---------+---------------+---------+-----------+----------+---------------+ PFV      Full                                                         +---------+---------------+---------+-----------+----------+---------------+ POP      Full                                                         +---------+---------------+---------+-----------+----------+---------------+ PTV                                                   patent by color +---------+---------------+---------+-----------+----------+---------------+ PERO                                                  patent by color +---------+---------------+---------+-----------+----------+---------------+   +---------+---------------+---------+-----------+----------+-------------------+ LEFT     CompressibilityPhasicitySpontaneityPropertiesThrombus Aging      +---------+---------------+---------+-----------+----------+-------------------+ CFV      None           No       No                   Acute               +---------+---------------+---------+-----------+----------+-------------------+ SFJ      Partial                                      Acute               +---------+---------------+---------+-----------+----------+-------------------+ FV Prox  Partial                                      Age Indeterminate   +---------+---------------+---------+-----------+----------+-------------------+ FV Mid   Partial                                      Age  Indeterminate   +---------+---------------+---------+-----------+----------+-------------------+ FV DistalPartial                                      Age Indeterminate   +---------+---------------+---------+-----------+----------+-------------------+ PFV      Full                                                             +---------+---------------+---------+-----------+----------+-------------------+  POP      None                                         Age Indeterminate   +---------+---------------+---------+-----------+----------+-------------------+ PTV                                                   Not well visualized +---------+---------------+---------+-----------+----------+-------------------+ PERO                                                  Not well visualized +---------+---------------+---------+-----------+----------+-------------------+ Bilateral iliac veins were not visualized due to body habitus.  Summary: RIGHT: - There is no evidence of deep vein thrombosis in the lower extremity.  LEFT: - Findings consistent with acute deep vein thrombosis involving the left common femoral vein. - Findings consistent with acute superficial vein thrombosis involving the left great saphenous vein.  - Findings consistent with age indeterminate deep vein thrombosis involving the left femoral vein, and left popliteal vein.   *See table(s) above for measurements and observations. Electronically signed by Lonni Gaskins MD on 09/04/2023 at 6:14:13 PM.    Final     Reassessment and Plan:   Patient's history of present illness and physical exam findings not consistent with any acute pathology given these clinical findings and objective results today. She has very chronic complaints without any acute component. States she is frustrated with her living facility. Her pain was treated with p.o. medications, cough suppressant given and prescribed.  Diuresis single dose IV  should provide substantial improvement of her mild volume overload Clinically, patient has no acute indications for hospitalization on my evaluation.  Disposition:  I have considered need for hospitalization, however, considering all of the above, I believe this patient is stable for discharge at this time.  Patient/family educated about specific return precautions for given chief complaint and symptoms.  Patient/family educated about follow-up with PCP.     Patient/family expressed understanding of return precautions and need for follow-up. Patient spoken to regarding all imaging and laboratory results and appropriate follow up for these results. All education provided in verbal form with additional information in written form. Time was allowed for answering of patient questions. Patient discharged.    Emergency Department Medication Summary:   Medications  oxyCODONE  (Oxy IR/ROXICODONE ) immediate release tablet 10 mg (10 mg Oral Given 10/04/23 0707)  furosemide  (LASIX ) injection 40 mg (40 mg Intravenous Given 10/04/23 0708)  benzonatate  (TESSALON ) capsule 100 mg (100 mg Oral Given 10/04/23 0707)         Emergency Department Medication Summary:   Medications  oxyCODONE  (Oxy IR/ROXICODONE ) immediate release tablet 10 mg (10 mg Oral Given 10/04/23 0707)  furosemide  (LASIX ) injection 40 mg (40 mg Intravenous Given 10/04/23 0708)  benzonatate  (TESSALON ) capsule 100 mg (100 mg Oral Given 10/04/23 0707)         Clinical Impression:  1. Subacute cough   2. Shortness of breath      Discharge   Final Clinical Impression(s) / ED Diagnoses Final diagnoses:  Subacute cough  Shortness of breath    Rx / DC Orders ED Discharge  Orders          Ordered    benzonatate  (TESSALON ) 100 MG capsule  Every 8 hours        10/04/23 0659              Jerral Meth, MD 10/04/23 [phone]

## 2023-10-05 ENCOUNTER — Telehealth (HOSPITAL_COMMUNITY): Payer: Self-pay

## 2023-10-05 NOTE — Telephone Encounter (Signed)
 Called to confirm/remind patient of their appointment at the [facility] on 10/06/23.   Appointment:   [] Confirmed  [x] Left mess   [] No answer/No voice mail  [] VM Full/unable to leave message  [] Phone not in service  And to bring in all medications and/or complete list.

## 2023-10-05 NOTE — Progress Notes (Signed)
 [facility] Note    PCP: Valma Carwin, MD Primary Cardiologist: Dr Loni HF Cardiologist: Dr Cherrie   HPI: [name] is a 68 y.o. female w/ h/o obesity, HTN, DM, atrial fib, DVT, PE x3 , s/p IVC filter placed in 2013, CHB s/p MDT single chamber PPM 08/2018, and chronic diastolic heart failure. In 2013, she had sleep study with mild sleep apnea noted.   R/LHC in 2016 showed no significant coronary disease, cardiac output 8.9 and PA pressured 41/13.    In January 2020 she had presyncopal episode and had some intermittent dizziness.  She told her PCP and was instructed to keep a log. In May she had presyncope and a syncopal episode. She had a fall off her porch. This was determined to be in the setting of CHB. MDT single chamber PPM placed. She also had minimally displaced C6 fracture.  Admitted 9/20  with acute on chronic diastolic HF. Started on IV diuretics. Echo showed EF 65-70% w/ pseudonormalization of LV consistent with diastolic dysfunction. She was diuresed w/ Lasix . RHC completed with preserved cardiac output and low filling pressures. PYP scan not convincing for amyloid. Outpatient sleep study recommended. EP saw 9/22 reprogrammed to VVI 50 due to asynchronous V pacing.  She had repeat sleep study 02/25/19 and found to have moderate OSA. No device yet. This is being followed by Dr. Shlomo. She has f/u visit on 04/26/19.   Last seen by Dr Cherrie in 2021. At  that time she was doing pretty good and continued on torsemide  60/40. She has not been seen since that time. Says she has been depressed since her husband passed away and put off calling the office.    Admitted 6/23 with A/C HFpEF, suspect in the setting of high sodium diet. Diuresed with IV lasix , eventually transitioned to GDMT. No SGLT2i with concern over hygiene/incontinence. Discharged home, weight 262 lbs.  Last seen in [facility] 10/2021. Lost to follow up.   Admitted 5/25 with a/c dHF. Cards and  Pulm consulted. She was diuresed and underwent R/LHC showing normal PCWP. 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 PCWP 11, RA pressure 13, PA 62/16, PVR elevated at 4.4. 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 Suspect this is obesity/hypoventilation syndrome - was noted to be hypercapnic as well. Seen by PMT for GOC, she remained DNR. Discharged to SNF, weight 288 lbs on oxygen.   Re-admitted 09/11/23 with acute resp failure with hypoxia. Given IV lasix  and discharged back to SNF.  Today she returns for HF follow up. We have not seen her since 2023. Feels awful. She is coughing so much she is vomiting. Weight up 15 lbs in 2 weeks. She remains at [name]. She is on 2L oxygen. She is SOB walking to restroom. Not able to tolerate CPAP. Swelling all over, she has CP when she coughs. She is dizzy. Denies palpitations, abnormal bleeding, or PND/Orthopnea. Appetite ok but vomiting 2/2 to cough. Takes all medications provided by facility. No family nearby, brother in [place]. Friend is her HCPOA.  Cardiac Studies: - R/LHC (6/25): no obstructive CAD; RA 13, PA 62/18 (37), PCWP 11, v waves to 22 mmHg, PVR 4.4. WU, PAPi 3.4  - Echo (5/25): EF 60-65%, moderate concentric LVH, normal RV  - Echo (6/23): EF 60-65%, moderate LVH, normal RV, moderate to severe mitral calcification  - Echo (9/20): EF 65-70%, moderate LVH, normal  RV  Past Medical History:  Diagnosis Date   A-fib (HCC)    Anemia    Anticoagulated on Coumadin , chronically 09/03/2011   Anxiety    Arthritis    right hip; both knees; left wrist/shoulder; back (01/19/2013   Bleeding on Coumadin  08/2012; 01/18/2013   [facility] admissions (01/19/2013)   CHF (congestive heart failure) (HCC)    2-3 times (01/19/2013)   Chronic lower back pain    Depression    DVT (deep venous thrombosis) (HCC) 10 years ago   numerous/notes  01/18/2013   GERD (gastroesophageal reflux disease)    Gout    Headache(784.0)    maybe weekly (01/19/2013)   Heart murmur    High cholesterol    been off RX for this at one time (01/18/2013)   History of blood transfusion 1983; 04/2012   3 w/ childbirth; hospitalized for pain (01/19/2013)   Hypertension    Hypothyroidism    Migraines    twice/yr maybe (01/19/2013)   Obstructive sleep apnea 05/03/2012   OSA (obstructive sleep apnea)    sent me for test in 04/2012; never ordered mask, etc (01/19/2013)   PE (pulmonary thromboembolism) (HCC) 3 years ag0   3/notes 01/18/2013   Pneumonia before 2011   once' (01/18/2013)   Renal disorder    kindey function low; Metformin was destroying my kidneys (01/19/2013)   Shortness of breath    only related to my CHF (01/18/2013)   Swelling of hand 08/31/2014   RT HAND   Type II diabetes mellitus (HCC)    UTI (urinary tract infection) 08/31/2014   Current Outpatient Medications  Medication Sig Dispense Refill   allopurinol  (ZYLOPRIM ) 300 MG tablet Take 300 mg by mouth daily.     amLODipine  (NORVASC ) 5 MG tablet Take 1 tablet (5 mg total) by mouth daily. 90 tablet 0   apixaban  (ELIQUIS ) 5 MG TABS tablet Take 5 mg by mouth 2 (two) times daily.     benzonatate  (TESSALON ) 100 MG capsule Take 1 capsule (100 mg total) by mouth every 8 (eight) hours. 21 capsule 0   Biotin w/ Vitamins C & E (HAIR SKIN & NAILS GUMMIES PO) Take 2 each by mouth daily at 4 PM.     cephALEXin  (KEFLEX ) 500 MG capsule Take 500 mg by mouth 2 (two) times daily.     citalopram  (CELEXA ) 20 MG tablet Take 20 mg by mouth at bedtime.     diazepam  (VALIUM ) 5 MG tablet Take 1 tablet (5 mg total) by mouth 2 (two) times daily as needed for anxiety or muscle spasms. 10 tablet 0   diphenhydramine -acetaminophen  (TYLENOL  PM) 25-500 MG TABS tablet Take 1 tablet by mouth at bedtime.     Ferrous Sulfate  (IRON  PO) Take 1 tablet by mouth daily.     insulin  aspart (NOVOLOG ) 100  UNIT/ML injection Inject 6 Units into the skin 3 (three) times daily with meals.     insulin  glargine (LANTUS  SOLOSTAR) 100 UNIT/ML Solostar Pen Inject 20 Units into the skin daily.     ipratropium-albuterol  (DUONEB) 0.5-2.5 (3) MG/3ML SOLN Take 3 mLs by nebulization every 6 (six) hours as needed.     levothyroxine  (SYNTHROID ) 75 MCG tablet Take 75 mcg by mouth daily before breakfast.      MELATONIN PO Take 2 each by mouth at bedtime. Melatonin gummies     metolazone  (ZAROXOLYN ) 2.5 MG tablet Take 1 tablet (2.5 mg total) by mouth as directed. By the heart failure clinic 4 tablet 0   Multiple  Vitamins-Minerals (CENTRUM SILVER) CHEW Chew 2 each by mouth daily.     potassium chloride  SA (KLOR-CON  M) 20 MEQ tablet Take 2 tablets (40 mEq total) by mouth as directed. Take with Metolazone  ONLY 4 tablet 0   rosuvastatin  (CRESTOR ) 10 MG tablet Take 1 tablet (10 mg total) by mouth daily. 30 tablet 0   torsemide  (DEMADEX ) 20 MG tablet Take 2 tablets (40mg ) by mouth twice daily, in the morning and at 4:00pm 180 tablet 0   Current Facility-Administered Medications  Medication Dose Route Frequency Provider Last Rate Last Admin   furosemide  (LASIX ) injection 80 mg  80 mg Intravenous Once Deloss Amico M, FNP       Allergies  Allergen Reactions   Ms Contin [Morphine] Hives and Rash        Social History   Socioeconomic History   Marital status: Single    Spouse name: Not on file   Number of children: Not on file   Years of education: Not on file   Highest education level: Not on file  Occupational History   Occupation: disabled  Tobacco Use   Smoking status: Former    Current packs/day: 0.00    Average packs/day: 0.1 packs/day for 30.0 years (1.5 ttl pk-yrs)    Types: Cigarettes    Start date: 54    Quit date: 25    Years since quitting: 35.5   Smokeless tobacco: Never   Tobacco comments:    01/19/2013 quit smoking cigarettes in the early '90's  Vaping Use   Vaping status: Never  Used  Substance and Sexual Activity   Alcohol use: Not Currently    Comment: rare now but never a heavy drinker   Drug use: No   Sexual activity: Never  Other Topics Concern   Not on file  Social History Narrative   Not on file   Social Drivers of Health   Financial Resource Strain: Not on file  Food Insecurity: No Food Insecurity (09/12/2023)   Hunger Vital Sign    Worried About Running Out of Food in the Last Year: Never true    Ran Out of Food in the Last Year: Never true  Transportation Needs: No Transportation Needs (09/12/2023)   PRAPARE - Administrator, Civil Service (Medical): No    Lack of Transportation (Non-Medical): No  Physical Activity: Not on file  Stress: Not on file  Social Connections: Socially Isolated (09/12/2023)   Social Connection and Isolation Panel    Frequency of Communication with Friends and Family: More than three times a week    Frequency of Social Gatherings with Friends and Family: Once a week    Attends Religious Services: Never    Database administrator or Organizations: No    Attends Banker Meetings: Never    Marital Status: Divorced  Catering manager Violence: Not At Risk (09/12/2023)   Humiliation, Afraid, Rape, and Kick questionnaire    Fear of Current or Ex-Partner: No    Emotionally Abused: No    Physically Abused: No    Sexually Abused: No   Family History  Problem Relation Age of Onset   Cerebral aneurysm Mother    Hypertension Father    Cerebral aneurysm Maternal Grandfather    Cerebral aneurysm Maternal Aunt    Cancer Maternal Uncle    BP (!) 152/80   Pulse (!) 50   Wt (!) 138.9 kg (306 lb 2 oz)   SpO2 99% Comment: 2L  BMI 57.84  kg/m   Wt Readings from Last 3 Encounters:  10/06/23 (!) 138.9 kg (306 lb 2 oz)  09/29/23 131 kg (288 lb 12.8 oz)  09/18/23 131.2 kg (289 lb 3.2 oz)   PHYSICAL EXAM: General:  + conversational dyspnea, arrived in [facility] on oxygen, chronically-ill appearing HEENT:  Normal Neck: Supple. Thick neck Cor: Irregular brady rate & rhythm. No rubs, gallops or murmurs. Lungs: Diminished in all lobes Abdomen: Soft, obese, nontender, + distended.  Extremities: No cyanosis, clubbing, rash, edema; ace bandages on legs but swelling above wraps from knees to abdomen Neuro: Alert & oriented x 3, moves all 4 extremities w/o difficulty. Affect pleasant.  ECG (personally reviewed): atrial fibrillation with slow VR, 51 bpm; IVCD QRS 160 msec  Device interrogation (personally reviewed): v-pacing 97.2%, 1.8 hrs/day activity  ASSESSMENT & PLAN: 1. Acute on Chronic Diastolic Heart Failure: - Echo (9/19): EF 65-70% , moderately increased LVH.  - RHC (12/2018): with preserved cardiac output and low filling pressures.  - PYP scan not convincing for amyloid.  - Echo (6/23): EF 60-65% RV normal. - Echo (5/25): EF 60-65%, moderate concentric LVH, normal RV - R/LHC (6/25): no obstructive CAD; RA 13, PA 62/18 (37), PCWP 11, v waves to 22 mmHg, PVR 4.4. WU, PAPi 3.4 - NYHA IIIb-IV, functional status confounded by body habitus/OSA/OHS. She has had significant function decline since we last saw her in 2023. She is volume overloaded on exam, weight up 15 lbs in 2 weeks. R>L HF. - Would be a good Furoscix  candidate but unfortunately cannot have this in SNF - Arrange for 80 mg IV Lasix  + 40 KCL in infusion clinic today - Tomorrow 7/2 and Wednesday 7/3, give metolazone  2.5 mg/20 KCL with AM dose of torsemide . - Continue Unna boots - Continue torsemide  40 mg bid. - Off spiro & ARB/ARNi with CKD IV - Would avoid SGLT2i w/ concern about hygeine/urinary incontinence.  - ? Cardiomems candidate. - Labs today. Will need repeat BMET in 1 week  2. 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 CKD Stage IIIb-IV - Baseline SCr  1.6-2 - Struggling with cardio-renal syndrome - She is followed by nephrology. - Labs today  4. Chronic Hypoxic Respiratory Failure - Diurese as above - Continue oxygen - Needs BiPAP.    5. H/o CHB  - s/p MDT PPM - Device interrogation as above. - She has been referred back to EP to re-establish.  6. Atrial fibrillation - Chronic, was in AF in 2021.  - Rate controlled, slow VR - Continue Eliquis . No bleeding issues.   7. H/o PEs - Now on Eliquis  5 mg bid - No bleeding issues.   8. OSA/OHS  - Unable to tolerate CPAP - Weight prohibits Inspire device - ? Oral device from dentist  9. GOC - Elected for DNR/DNI during recent admission - Should be followed by Palliative  10. Morbid Obesity - Body mass index is 57.84 kg/m. - Consider GLP1. Discuss at next visit.   Follow up in 1 week with APP. She is high risk for re-admission.   [place], FNP-BC. 10/06/23

## 2023-10-06 ENCOUNTER — Encounter (HOSPITAL_COMMUNITY): Payer: Self-pay

## 2023-10-06 ENCOUNTER — Encounter (HOSPITAL_COMMUNITY)
Admission: RE | Admit: 2023-10-06 | Discharge: 2023-10-06 | Disposition: A | Discharge status: Home / Self Care | Source: Ambulatory Visit | Attending: Family Medicine

## 2023-10-06 ENCOUNTER — Ambulatory Visit (HOSPITAL_COMMUNITY): Payer: Self-pay | Admitting: Family Medicine

## 2023-10-06 ENCOUNTER — Ambulatory Visit (HOSPITAL_COMMUNITY)
Admission: RE | Admit: 2023-10-06 | Discharge: 2023-10-06 | Disposition: A | Discharge status: Home / Self Care | Source: Ambulatory Visit | Attending: Family Medicine | Admitting: Family Medicine

## 2023-10-06 VITALS — BP 152/80 | HR 50 | Wt 306.1 lb

## 2023-10-06 DIAGNOSIS — I272 Pulmonary hypertension, unspecified: Secondary | ICD-10-CM | POA: Insufficient documentation

## 2023-10-06 DIAGNOSIS — I130 Hypertensive heart and chronic kidney disease with heart failure and stage 1 through stage 4 chronic kidney disease, or unspecified chronic kidney disease: Secondary | ICD-10-CM | POA: Diagnosis not present

## 2023-10-06 DIAGNOSIS — N184 Chronic kidney disease, stage 4 (severe): Secondary | ICD-10-CM | POA: Diagnosis not present

## 2023-10-06 DIAGNOSIS — Z66 Do not resuscitate: Secondary | ICD-10-CM | POA: Diagnosis not present

## 2023-10-06 DIAGNOSIS — I4891 Unspecified atrial fibrillation: Secondary | ICD-10-CM | POA: Insufficient documentation

## 2023-10-06 DIAGNOSIS — Z79899 Other long term (current) drug therapy: Secondary | ICD-10-CM | POA: Insufficient documentation

## 2023-10-06 DIAGNOSIS — G4733 Obstructive sleep apnea (adult) (pediatric): Secondary | ICD-10-CM | POA: Insufficient documentation

## 2023-10-06 DIAGNOSIS — J9611 Chronic respiratory failure with hypoxia: Secondary | ICD-10-CM | POA: Insufficient documentation

## 2023-10-06 DIAGNOSIS — Z950 Presence of cardiac pacemaker: Secondary | ICD-10-CM | POA: Insufficient documentation

## 2023-10-06 DIAGNOSIS — I482 Chronic atrial fibrillation, unspecified: Secondary | ICD-10-CM

## 2023-10-06 DIAGNOSIS — Z87891 Personal history of nicotine dependence: Secondary | ICD-10-CM | POA: Diagnosis not present

## 2023-10-06 DIAGNOSIS — Z6841 Body Mass Index (BMI) 40.0 and over, adult: Secondary | ICD-10-CM | POA: Diagnosis not present

## 2023-10-06 DIAGNOSIS — E1122 Type 2 diabetes mellitus with diabetic chronic kidney disease: Secondary | ICD-10-CM | POA: Insufficient documentation

## 2023-10-06 DIAGNOSIS — Z86711 Personal history of pulmonary embolism: Secondary | ICD-10-CM | POA: Insufficient documentation

## 2023-10-06 DIAGNOSIS — N183 Chronic kidney disease, stage 3 unspecified: Secondary | ICD-10-CM

## 2023-10-06 DIAGNOSIS — Z7901 Long term (current) use of anticoagulants: Secondary | ICD-10-CM | POA: Diagnosis not present

## 2023-10-06 DIAGNOSIS — Z794 Long term (current) use of insulin: Secondary | ICD-10-CM | POA: Insufficient documentation

## 2023-10-06 DIAGNOSIS — Z86718 Personal history of other venous thrombosis and embolism: Secondary | ICD-10-CM | POA: Diagnosis not present

## 2023-10-06 DIAGNOSIS — N1832 Chronic kidney disease, stage 3b: Secondary | ICD-10-CM | POA: Diagnosis not present

## 2023-10-06 DIAGNOSIS — Z8249 Family history of ischemic heart disease and other diseases of the circulatory system: Secondary | ICD-10-CM | POA: Diagnosis not present

## 2023-10-06 DIAGNOSIS — Z9981 Dependence on supplemental oxygen: Secondary | ICD-10-CM | POA: Diagnosis not present

## 2023-10-06 DIAGNOSIS — I5033 Acute on chronic diastolic (congestive) heart failure: Secondary | ICD-10-CM | POA: Insufficient documentation

## 2023-10-06 DIAGNOSIS — I459 Conduction disorder, unspecified: Secondary | ICD-10-CM | POA: Diagnosis not present

## 2023-10-06 LAB — CBC
HCT: 32 % — ABNORMAL LOW (ref 36.0–46.0)
Hemoglobin: 9.7 g/dL — ABNORMAL LOW (ref 12.0–15.0)
MCH: 32.2 pg (ref 26.0–34.0)
MCHC: 30.3 g/dL (ref 30.0–36.0)
MCV: 106.3 fL — ABNORMAL HIGH (ref 80.0–100.0)
Platelets: 152 10*3/uL (ref 150–400)
RBC: 3.01 MIL/uL — ABNORMAL LOW (ref 3.87–5.11)
RDW: 14.5 % (ref 11.5–15.5)
WBC: 8.5 10*3/uL (ref 4.0–10.5)
nRBC: 0 % (ref 0.0–0.2)

## 2023-10-06 LAB — BASIC METABOLIC PANEL WITH GFR
Anion gap: 11 (ref 5–15)
BUN: 78 mg/dL — ABNORMAL HIGH (ref 8–23)
CO2: 25 mmol/L (ref 22–32)
Calcium: 8.9 mg/dL (ref 8.9–10.3)
Chloride: 103 mmol/L (ref 98–111)
Creatinine, Ser: 2.17 mg/dL — ABNORMAL HIGH (ref 0.44–1.00)
GFR, Estimated: 24 mL/min — ABNORMAL LOW (ref >60)
Glucose, Bld: 119 mg/dL — ABNORMAL HIGH (ref 70–99)
Potassium: 4.2 mmol/L (ref 3.5–5.1)
Sodium: 139 mmol/L (ref 135–145)

## 2023-10-06 LAB — BRAIN NATRIURETIC PEPTIDE: B Natriuretic Peptide: 496.3 pg/mL — ABNORMAL HIGH (ref 0.0–100.0)

## 2023-10-06 MED ORDER — METOLAZONE 2.5 MG PO TABS: 2.5000 mg | ORAL_TABLET | ORAL | 0 refills | Status: DC

## 2023-10-06 MED ORDER — FUROSEMIDE 10 MG/ML IJ SOLN: 80.0000 mg | Freq: Once | INTRAMUSCULAR | Status: DC

## 2023-10-06 MED ORDER — FUROSEMIDE 10 MG/ML IJ SOLN
80.0000 mg | Freq: Once | INTRAMUSCULAR | Status: AC
  Administered 2023-10-06: 80 mg via INTRAVENOUS

## 2023-10-06 MED ORDER — POTASSIUM CHLORIDE CRYS ER 20 MEQ PO TBCR
40.0000 meq | EXTENDED_RELEASE_TABLET | Freq: Once | ORAL | Status: AC
  Administered 2023-10-06: 40 meq via ORAL

## 2023-10-06 MED ORDER — FUROSEMIDE 10 MG/ML IJ SOLN
INTRAMUSCULAR | Status: AC
  Filled 2023-10-06: qty 8

## 2023-10-06 MED ORDER — POTASSIUM CHLORIDE CRYS ER 20 MEQ PO TBCR: 40.0000 meq | EXTENDED_RELEASE_TABLET | ORAL | 0 refills | Status: DC

## 2023-10-06 NOTE — Patient Instructions (Signed)
 TAKE Metolazone  2.5 mg ( 1 Tab) today and tomorrow with 20 mEq of potassium.  Labs done today, your results will be available in MyChart, we will contact you for abnormal readings.  Your physician recommends that you schedule a follow-up appointment as scheduled.  If you have any questions or concerns before your next appointment please send us  a message through [facility] or call our office at [phone].    TO LEAVE A MESSAGE FOR THE NURSE SELECT OPTION 2, PLEASE LEAVE A MESSAGE INCLUDING: YOUR NAME DATE OF BIRTH CALL BACK NUMBER REASON FOR CALL**this is important as we prioritize the call backs  YOU WILL RECEIVE A CALL BACK THE SAME DAY AS LONG AS YOU CALL BEFORE 4:00 PM  At the [facility], you and your health needs are our priority. As part of our continuing mission to provide you with exceptional heart care, we have created designated Provider Care Teams. These Care Teams include your primary Cardiologist (physician) and Advanced Practice Providers (APPs- Physician Assistants and Nurse Practitioners) who all work together to provide you with the care you need, when you need it.   You may see any of the following providers on your designated Care Team at your next follow up: Dr Toribio Fuel Dr Ezra Shuck Dr. Ria Commander Dr. Morene Brownie Amy Lenetta, NP Caffie Shed, [place] [facility] [place], [place] Beckey Coe, NP [place] Lee, NP Ellouise Class, NP Tinnie Redman, PharmD Jaun Bash, PharmD   Please be sure to bring in all your medications bottles to every appointment.    Thank you for choosing [facility]

## 2023-10-16 NOTE — Addendum Note (Signed)
 Addended by: VICCI SELLER A on: 10/16/2023 10:52 AM   Modules accepted: Orders

## 2023-10-16 NOTE — Progress Notes (Signed)
 Remote pacemaker transmission.

## 2023-10-20 NOTE — Progress Notes (Incomplete)
 [facility] Note    PCP: Valma Carwin, MD Primary Cardiologist: Dr Loni HF Cardiologist: Dr Cherrie   HPI: [name] is a 68 y.o. female w/ h/o obesity, HTN, DM, atrial fib, DVT, PE x3 , s/p IVC filter placed in 2013, CHB s/p MDT single chamber PPM 08/2018, and chronic diastolic heart failure. In 2013, she had sleep study with mild sleep apnea noted.   R/LHC in 2016 showed no significant coronary disease, cardiac output 8.9 and PA pressured 41/13.    In January 2020 she had presyncopal episode and had some intermittent dizziness.  She told her PCP and was instructed to keep a log. In May she had presyncope and a syncopal episode. She had a fall off her porch. This was determined to be in the setting of CHB. MDT single chamber PPM placed. She also had minimally displaced C6 fracture.  Admitted 9/20  with acute on chronic diastolic HF. Started on IV diuretics. Echo showed EF 65-70% w/ pseudonormalization of LV consistent with diastolic dysfunction. She was diuresed w/ Lasix . RHC completed with preserved cardiac output and low filling pressures. PYP scan not convincing for amyloid. Outpatient sleep study recommended. EP saw 9/22 reprogrammed to VVI 50 due to asynchronous V pacing.  She had repeat sleep study 02/25/19 and found to have moderate OSA. No device yet. This is being followed by Dr. Shlomo. She has f/u visit on 04/26/19.   Last seen by Dr Cherrie in 2021. At  that time she was doing pretty good and continued on torsemide  60/40. She has not been seen since that time. Says she has been depressed since her husband passed away and put off calling the office.    Admitted 6/23 with A/C HFpEF, suspect in the setting of high sodium diet. Diuresed with IV lasix , eventually transitioned to GDMT. No SGLT2i with concern over hygiene/incontinence. Discharged home, weight 262 lbs.  Last seen in [facility] 10/2021. Lost to follow up.   Admitted 5/25 with a/c dHF. Cards and  Pulm consulted. She was diuresed and underwent R/LHC showing normal PCWP. 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 PCWP 11, RA pressure 13, PA 62/16, PVR elevated at 4.4. 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 Suspect this is obesity/hypoventilation syndrome - was noted to be hypercapnic as well. Seen by PMT for GOC, she remained DNR. Discharged to SNF, weight 288 lbs on oxygen.   Re-admitted 09/11/23 with acute resp failure with hypoxia. Given IV lasix  and discharged back to SNF.  Today she returns for HF follow up. We have not seen her since 2023. Feels awful. She is coughing so much she is vomiting. Weight up 15 lbs in 2 weeks. She remains at [facility]. She is on 2L oxygen. She is SOB walking to restroom. Not able to tolerate CPAP. Swelling all over, she has CP when she coughs. She is dizzy. Denies palpitations, abnormal bleeding, or PND/Orthopnea. Appetite ok but vomiting 2/2 to cough. Takes all medications provided by facility. No family nearby, brother in [place]. Friend is her HCPOA.  Cardiac Studies: - R/LHC (6/25): no obstructive CAD; RA 13, PA 62/18 (37), PCWP 11, v waves to 22 mmHg, PVR 4.4. WU, PAPi 3.4  - Echo (5/25): EF 60-65%, moderate concentric LVH, normal RV  - Echo (6/23): EF 60-65%, moderate LVH, normal RV, moderate to severe mitral calcification  - Echo (9/20): EF 65-70%, moderate LVH, normal  RV  Past Medical History:  Diagnosis Date   A-fib (HCC)    Anemia    Anticoagulated on Coumadin , chronically 09/03/2011   Anxiety    Arthritis    right hip; both knees; left wrist/shoulder; back (01/19/2013   Bleeding on Coumadin  08/2012; 01/18/2013   [facility] admissions (01/19/2013)   CHF (congestive heart failure) (HCC)    2-3 times (01/19/2013)   Chronic lower back pain    Depression    DVT (deep venous thrombosis) (HCC) 10 years ago   numerous/notes  01/18/2013   GERD (gastroesophageal reflux disease)    Gout    Headache(784.0)    maybe weekly (01/19/2013)   Heart murmur    High cholesterol    been off RX for this at one time (01/18/2013)   History of blood transfusion 1983; 04/2012   3 w/ childbirth; hospitalized for pain (01/19/2013)   Hypertension    Hypothyroidism    Migraines    twice/yr maybe (01/19/2013)   Obstructive sleep apnea 05/03/2012   OSA (obstructive sleep apnea)    sent me for test in 04/2012; never ordered mask, etc (01/19/2013)   PE (pulmonary thromboembolism) (HCC) 3 years ag0   3/notes 01/18/2013   Pneumonia before 2011   once' (01/18/2013)   Renal disorder    kindey function low; Metformin was destroying my kidneys (01/19/2013)   Shortness of breath    only related to my CHF (01/18/2013)   Swelling of hand 08/31/2014   RT HAND   Type II diabetes mellitus (HCC)    UTI (urinary tract infection) 08/31/2014   Current Outpatient Medications  Medication Sig Dispense Refill   allopurinol  (ZYLOPRIM ) 300 MG tablet Take 300 mg by mouth daily.     amLODipine  (NORVASC ) 5 MG tablet Take 1 tablet (5 mg total) by mouth daily. 90 tablet 0   apixaban  (ELIQUIS ) 5 MG TABS tablet Take 5 mg by mouth 2 (two) times daily.     benzonatate  (TESSALON ) 100 MG capsule Take 1 capsule (100 mg total) by mouth every 8 (eight) hours. 21 capsule 0   Biotin w/ Vitamins C & E (HAIR SKIN & NAILS GUMMIES PO) Take 2 each by mouth daily at 4 PM.     cephALEXin  (KEFLEX ) 500 MG capsule Take 500 mg by mouth 2 (two) times daily.     citalopram  (CELEXA ) 20 MG tablet Take 20 mg by mouth at bedtime.     diazepam  (VALIUM ) 5 MG tablet Take 1 tablet (5 mg total) by mouth 2 (two) times daily as needed for anxiety or muscle spasms. 10 tablet 0   diphenhydramine -acetaminophen  (TYLENOL  PM) 25-500 MG TABS tablet Take 1 tablet by mouth at bedtime.     Ferrous Sulfate  (IRON  PO) Take 1 tablet by mouth daily.     insulin  aspart (NOVOLOG ) 100  UNIT/ML injection Inject 6 Units into the skin 3 (three) times daily with meals.     insulin  glargine (LANTUS  SOLOSTAR) 100 UNIT/ML Solostar Pen Inject 20 Units into the skin daily.     ipratropium-albuterol  (DUONEB) 0.5-2.5 (3) MG/3ML SOLN Take 3 mLs by nebulization every 6 (six) hours as needed.     levothyroxine  (SYNTHROID ) 75 MCG tablet Take 75 mcg by mouth daily before breakfast.      MELATONIN PO Take 2 each by mouth at bedtime. Melatonin gummies     metolazone  (ZAROXOLYN ) 2.5 MG tablet Take 1 tablet (2.5 mg total) by mouth as directed. By the heart failure clinic 4 tablet 0   Multiple  Vitamins-Minerals (CENTRUM SILVER) CHEW Chew 2 each by mouth daily.     potassium chloride  SA (KLOR-CON  M) 20 MEQ tablet Take 2 tablets (40 mEq total) by mouth as directed. Take with Metolazone  ONLY 4 tablet 0   rosuvastatin  (CRESTOR ) 10 MG tablet Take 1 tablet (10 mg total) by mouth daily. 30 tablet 0   torsemide  (DEMADEX ) 20 MG tablet Take 2 tablets (40mg ) by mouth twice daily, in the morning and at 4:00pm 180 tablet 0   No current facility-administered medications for this visit.   Allergies  Allergen Reactions   Ms Contin [Morphine] Hives and Rash        Social History   Socioeconomic History   Marital status: Single    Spouse name: Not on file   Number of children: Not on file   Years of education: Not on file   Highest education level: Not on file  Occupational History   Occupation: disabled  Tobacco Use   Smoking status: Former    Current packs/day: 0.00    Average packs/day: 0.1 packs/day for 30.0 years (1.5 ttl pk-yrs)    Types: Cigarettes    Start date: 3    Quit date: 51    Years since quitting: 35.5   Smokeless tobacco: Never   Tobacco comments:    01/19/2013 quit smoking cigarettes in the early '90's  Vaping Use   Vaping status: Never Used  Substance and Sexual Activity   Alcohol use: Not Currently    Comment: rare now but never a heavy drinker   Drug use: No    Sexual activity: Never  Other Topics Concern   Not on file  Social History Narrative   Not on file   Social Drivers of Health   Financial Resource Strain: Not on file  Food Insecurity: No Food Insecurity (09/12/2023)   Hunger Vital Sign    Worried About Running Out of Food in the Last Year: Never true    Ran Out of Food in the Last Year: Never true  Transportation Needs: No Transportation Needs (09/12/2023)   PRAPARE - Administrator, Civil Service (Medical): No    Lack of Transportation (Non-Medical): No  Physical Activity: Not on file  Stress: Not on file  Social Connections: Socially Isolated (09/12/2023)   Social Connection and Isolation Panel    Frequency of Communication with Friends and Family: More than three times a week    Frequency of Social Gatherings with Friends and Family: Once a week    Attends Religious Services: Never    Database administrator or Organizations: No    Attends Banker Meetings: Never    Marital Status: Divorced  Catering manager Violence: Not At Risk (09/12/2023)   Humiliation, Afraid, Rape, and Kick questionnaire    Fear of Current or Ex-Partner: No    Emotionally Abused: No    Physically Abused: No    Sexually Abused: No   Family History  Problem Relation Age of Onset   Cerebral aneurysm Mother    Hypertension Father    Cerebral aneurysm Maternal Grandfather    Cerebral aneurysm Maternal Aunt    Cancer Maternal Uncle    There were no vitals taken for this visit.  Wt Readings from Last 3 Encounters:  10/06/23 (!) 138.9 kg (306 lb 2 oz)  09/29/23 131 kg (288 lb 12.8 oz)  09/18/23 131.2 kg (289 lb 3.2 oz)   PHYSICAL EXAM: General:  + conversational dyspnea, arrived in [facility] on  oxygen, chronically-ill appearing HEENT: Normal Neck: Supple. Thick neck Cor: Irregular brady rate & rhythm. No rubs, gallops or murmurs. Lungs: Diminished in all lobes Abdomen: Soft, obese, nontender, + distended.  Extremities: No cyanosis,  clubbing, rash, edema; ace bandages on legs but swelling above wraps from knees to abdomen Neuro: Alert & oriented x 3, moves all 4 extremities w/o difficulty. Affect pleasant.  ECG (personally reviewed): atrial fibrillation with slow VR, 51 bpm; IVCD QRS 160 msec  Device interrogation (personally reviewed): v-pacing 97.2%, 1.8 hrs/day activity  ASSESSMENT & PLAN: 1. Acute on Chronic Diastolic Heart Failure: - Echo (9/19): EF 65-70% , moderately increased LVH.  - RHC (12/2018): with preserved cardiac output and low filling pressures.  - PYP scan not convincing for amyloid.  - Echo (6/23): EF 60-65% RV normal. - Echo (5/25): EF 60-65%, moderate concentric LVH, normal RV - R/LHC (6/25): no obstructive CAD; RA 13, PA 62/18 (37), PCWP 11, v waves to 22 mmHg, PVR 4.4. WU, PAPi 3.4 - NYHA IIIb-IV, functional status confounded by body habitus/OSA/OHS. She has had significant function decline since we last saw her in 2023. She is volume overloaded on exam, weight up 15 lbs in 2 weeks. R>L HF. - Would be a good Furoscix  candidate but unfortunately cannot have this in SNF - Arrange for 80 mg IV Lasix  + 40 KCL in infusion clinic today - Tomorrow 7/2 and Wednesday 7/3, give metolazone  2.5 mg/20 KCL with AM dose of torsemide . - Continue Unna boots - Continue torsemide  40 mg bid. - Off spiro & ARB/ARNi with CKD IV - Would avoid SGLT2i w/ concern about hygeine/urinary incontinence.  - ? Cardiomems candidate. - Labs today. Will need repeat BMET in 1 week  2. 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 CKD Stage IIIb-IV - Baseline SCr 1.6-2 - Struggling with cardio-renal syndrome - She is followed by nephrology. - Labs today  4. Chronic Hypoxic Respiratory Failure - Diurese as above - Continue oxygen - Needs BiPAP.    5. H/o CHB  -  s/p MDT PPM - Device interrogation as above. - She has been referred back to EP to re-establish.  6. Atrial fibrillation - Chronic, was in AF in 2021.  - Rate controlled, slow VR - Continue Eliquis . No bleeding issues.   7. H/o PEs - Now on Eliquis  5 mg bid - No bleeding issues.   8. OSA/OHS  - Unable to tolerate CPAP - Weight prohibits Inspire device - ? Oral device from dentist  9. GOC - Elected for DNR/DNI during recent admission - Should be followed by Palliative  10. Morbid Obesity - There is no height or weight on file to calculate BMI. - Consider GLP1. Discuss at next visit.   Follow up in 1 week with APP. She is high risk for re-admission.   [place], FNP-BC. 10/20/23

## 2023-10-21 ENCOUNTER — Encounter (HOSPITAL_COMMUNITY)

## 2023-10-23 ENCOUNTER — Telehealth (HOSPITAL_COMMUNITY): Payer: Self-pay

## 2023-10-23 NOTE — Telephone Encounter (Signed)
 Called to confirm/remind patient of their appointment at the [facility] on 10/26/23.   Appointment:   [] Confirmed  [x] Left mess   [] No answer/No voice mail  [] VM Full/unable to leave message  [] Phone not in service  And to bring all medications and/or complete list.

## 2023-10-26 ENCOUNTER — Encounter (HOSPITAL_COMMUNITY): Payer: Self-pay

## 2023-10-26 ENCOUNTER — Ambulatory Visit (HOSPITAL_COMMUNITY)
Admission: RE | Admit: 2023-10-26 | Discharge: 2023-10-26 | Disposition: A | Discharge status: Home / Self Care | Source: Ambulatory Visit | Attending: Family Medicine | Admitting: Family Medicine

## 2023-10-26 ENCOUNTER — Ambulatory Visit (HOSPITAL_COMMUNITY): Payer: Self-pay | Admitting: Family Medicine

## 2023-10-26 VITALS — BP 160/64 | HR 56 | Ht 61.0 in | Wt 272.0 lb

## 2023-10-26 DIAGNOSIS — I110 Hypertensive heart disease with heart failure: Secondary | ICD-10-CM | POA: Diagnosis present

## 2023-10-26 DIAGNOSIS — I442 Atrioventricular block, complete: Secondary | ICD-10-CM | POA: Insufficient documentation

## 2023-10-26 DIAGNOSIS — E119 Type 2 diabetes mellitus without complications: Secondary | ICD-10-CM | POA: Insufficient documentation

## 2023-10-26 DIAGNOSIS — Z79899 Other long term (current) drug therapy: Secondary | ICD-10-CM | POA: Insufficient documentation

## 2023-10-26 DIAGNOSIS — Z950 Presence of cardiac pacemaker: Secondary | ICD-10-CM | POA: Diagnosis not present

## 2023-10-26 DIAGNOSIS — J9611 Chronic respiratory failure with hypoxia: Secondary | ICD-10-CM | POA: Diagnosis not present

## 2023-10-26 DIAGNOSIS — I10 Essential (primary) hypertension: Secondary | ICD-10-CM

## 2023-10-26 DIAGNOSIS — Z86711 Personal history of pulmonary embolism: Secondary | ICD-10-CM | POA: Insufficient documentation

## 2023-10-26 DIAGNOSIS — E669 Obesity, unspecified: Secondary | ICD-10-CM | POA: Insufficient documentation

## 2023-10-26 DIAGNOSIS — I272 Pulmonary hypertension, unspecified: Secondary | ICD-10-CM | POA: Insufficient documentation

## 2023-10-26 DIAGNOSIS — Z6841 Body Mass Index (BMI) 40.0 and over, adult: Secondary | ICD-10-CM | POA: Insufficient documentation

## 2023-10-26 DIAGNOSIS — Z9981 Dependence on supplemental oxygen: Secondary | ICD-10-CM | POA: Diagnosis not present

## 2023-10-26 DIAGNOSIS — Z794 Long term (current) use of insulin: Secondary | ICD-10-CM | POA: Insufficient documentation

## 2023-10-26 DIAGNOSIS — I482 Chronic atrial fibrillation, unspecified: Secondary | ICD-10-CM | POA: Insufficient documentation

## 2023-10-26 DIAGNOSIS — G4733 Obstructive sleep apnea (adult) (pediatric): Secondary | ICD-10-CM | POA: Diagnosis not present

## 2023-10-26 DIAGNOSIS — N184 Chronic kidney disease, stage 4 (severe): Secondary | ICD-10-CM | POA: Insufficient documentation

## 2023-10-26 DIAGNOSIS — Z7901 Long term (current) use of anticoagulants: Secondary | ICD-10-CM | POA: Diagnosis not present

## 2023-10-26 DIAGNOSIS — I130 Hypertensive heart and chronic kidney disease with heart failure and stage 1 through stage 4 chronic kidney disease, or unspecified chronic kidney disease: Secondary | ICD-10-CM | POA: Insufficient documentation

## 2023-10-26 DIAGNOSIS — Z86718 Personal history of other venous thrombosis and embolism: Secondary | ICD-10-CM | POA: Diagnosis not present

## 2023-10-26 DIAGNOSIS — I5032 Chronic diastolic (congestive) heart failure: Secondary | ICD-10-CM | POA: Insufficient documentation

## 2023-10-26 DIAGNOSIS — N183 Chronic kidney disease, stage 3 unspecified: Secondary | ICD-10-CM

## 2023-10-26 LAB — BASIC METABOLIC PANEL WITH GFR
Anion gap: 11 (ref 5–15)
BUN: 74 mg/dL — ABNORMAL HIGH (ref 8–23)
CO2: 28 mmol/L (ref 22–32)
Calcium: 9.1 mg/dL (ref 8.9–10.3)
Chloride: 101 mmol/L (ref 98–111)
Creatinine, Ser: 1.96 mg/dL — ABNORMAL HIGH (ref 0.44–1.00)
GFR, Estimated: 28 mL/min — ABNORMAL LOW (ref >60)
Glucose, Bld: 146 mg/dL — ABNORMAL HIGH (ref 70–99)
Potassium: 4 mmol/L (ref 3.5–5.1)
Sodium: 140 mmol/L (ref 135–145)

## 2023-10-26 LAB — MAGNESIUM: Magnesium: 2.3 mg/dL (ref 1.7–2.4)

## 2023-10-26 LAB — BRAIN NATRIURETIC PEPTIDE: B Natriuretic Peptide: 713.6 pg/mL — ABNORMAL HIGH (ref 0.0–100.0)

## 2023-10-26 MED ORDER — AMLODIPINE BESYLATE 5 MG PO TABS: 10.0000 mg | ORAL_TABLET | Freq: Every day | ORAL | Status: DC

## 2023-10-26 NOTE — Progress Notes (Signed)
 Advanced [facility] Failure Clinic Note    PCP: Valma Carwin, MD Primary Cardiologist: Dr Loni HF Cardiologist: Dr Cherrie   HPI: [name] is a 68 y.o. female w/ h/o obesity, HTN, DM, atrial fib, DVT, PE x3 , s/p IVC filter placed in 2013, CHB s/p MDT single chamber PPM 08/2018, and chronic diastolic heart failure. In 2013, she had sleep study with mild sleep apnea noted.   R/LHC in 2016 showed no significant coronary disease, cardiac output 8.9 and PA pressured 41/13.    In January 2020 she had presyncopal episode and had some intermittent dizziness.  She told her PCP and was instructed to keep a log. In May she had presyncope and a syncopal episode. She had a fall off her porch. This was determined to be in the setting of CHB. MDT single chamber PPM placed. She also had minimally displaced C6 fracture.  Admitted 9/20  with acute on chronic diastolic HF. Started on IV diuretics. Echo showed EF 65-70% w/ pseudonormalization of LV consistent with diastolic dysfunction. She was diuresed w/ Lasix . RHC completed with preserved cardiac output and low filling pressures. PYP scan not convincing for amyloid. Outpatient sleep study recommended. EP saw 9/22 reprogrammed to VVI 50 due to asynchronous V pacing.  She had repeat sleep study 02/25/19 and found to have moderate OSA. No device yet. This is being followed by Dr. Shlomo. She has f/u visit on 04/26/19.   Last seen by Dr Cherrie in 2021. At  that time she was doing pretty good and continued on torsemide  60/40. She has not been seen since that time. Says she has been depressed since her husband passed away and put off calling the office.    Admitted 6/23 with A/C HFpEF, suspect in the setting of high sodium diet. Diuresed with IV lasix , eventually transitioned to GDMT. No SGLT2i with concern over hygiene/incontinence. Discharged home, weight 262 lbs.  Last seen in [facility] 10/2021. Lost to follow up.   Admitted 5/25 with a/c dHF. Cards and  Pulm consulted. She was diuresed and underwent R/LHC showing normal PCWP. 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 PCWP 11, RA pressure 13, PA 62/16, PVR elevated at 4.4. 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 Suspect this is obesity/hypoventilation syndrome - was noted to be hypercapnic as well. Seen by PMT for GOC, she remained DNR. Discharged to SNF, weight 288 lbs on oxygen.   Re-admitted 09/11/23 with acute resp failure with hypoxia. Given IV lasix  and discharged back to SNF.  Follow up 10/06/23, we had not seen her since 2023. Massively volume overloaded with NYHA IIIb-IV symptoms. Arranged for 80 mg IV lasix  in infusion clinic followed by 2 days of metolazone  2.5 mg/40 KCL along with torsemide  40 mg bid.   Today she returns for HF follow up. Overall feeling wiped out. Weight down 34 lbs. Swelling better in legs. Remains on 2-3L oxygen at facility. Felt some palps this AM. Able to walk short distances with her walker around her room without dyspnea, limited by back pain. Denies palpitations, abnormal bleeding, CP, dizziness, or PND/Orthopnea. Appetite ok. Taking all medications provided at facility.  Continues with nausea, she attributes this to reflux.   Cardiac Studies: - R/LHC (6/25): no obstructive CAD; RA 13, PA 62/18 (37), PCWP 11, v waves to 22 mmHg, PVR 4.4. WU, PAPi 3.4  - Echo (5/25): EF 60-65%, moderate concentric LVH, normal RV  -  Echo (6/23): EF 60-65%, moderate LVH, normal RV, moderate to severe mitral calcification  - Echo (9/20): EF 65-70%, moderate LVH, normal RV  Past Medical History:  Diagnosis Date   A-fib (HCC)    Anemia    Anticoagulated on Coumadin , chronically 09/03/2011   Anxiety    Arthritis    right hip; both knees; left wrist/shoulder; back (01/19/2013   Bleeding on Coumadin  08/2012; 01/18/2013   [facility] admissions (01/19/2013)    CHF (congestive heart failure) (HCC)    2-3 times (01/19/2013)   Chronic lower back pain    Depression    DVT (deep venous thrombosis) (HCC) 10 years ago   numerous/notes 01/18/2013   GERD (gastroesophageal reflux disease)    Gout    Headache(784.0)    maybe weekly (01/19/2013)   Heart murmur    High cholesterol    been off RX for this at one time (01/18/2013)   History of blood transfusion 1983; 04/2012   3 w/ childbirth; hospitalized for pain (01/19/2013)   Hypertension    Hypothyroidism    Migraines    twice/yr maybe (01/19/2013)   Obstructive sleep apnea 05/03/2012   OSA (obstructive sleep apnea)    sent me for test in 04/2012; never ordered mask, etc (01/19/2013)   PE (pulmonary thromboembolism) (HCC) 3 years ag0   3/notes 01/18/2013   Pneumonia before 2011   once' (01/18/2013)   Renal disorder    kindey function low; Metformin was destroying my kidneys (01/19/2013)   Shortness of breath    only related to my CHF (01/18/2013)   Swelling of hand 08/31/2014   RT HAND   Type II diabetes mellitus (HCC)    UTI (urinary tract infection) 08/31/2014   Current Outpatient Medications  Medication Sig Dispense Refill   allopurinol  (ZYLOPRIM ) 300 MG tablet Take 300 mg by mouth daily.     amLODipine  (NORVASC ) 5 MG tablet Take 1 tablet (5 mg total) by mouth daily. 90 tablet 0   apixaban  (ELIQUIS ) 5 MG TABS tablet Take 5 mg by mouth 2 (two) times daily.     benzonatate  (TESSALON ) 100 MG capsule Take 1 capsule (100 mg total) by mouth every 8 (eight) hours. 21 capsule 0   Biotin w/ Vitamins C & E (HAIR SKIN & NAILS GUMMIES PO) Take 2 each by mouth daily at 4 PM.     cephALEXin  (KEFLEX ) 500 MG capsule Take 500 mg by mouth 2 (two) times daily.     citalopram  (CELEXA ) 20 MG tablet Take 20 mg by mouth at bedtime.     diazepam  (VALIUM ) 5 MG tablet Take 1 tablet (5 mg total) by mouth 2 (two) times daily as needed for anxiety or muscle spasms. 10 tablet 0    diphenhydramine -acetaminophen  (TYLENOL  PM) 25-500 MG TABS tablet Take 1 tablet by mouth at bedtime.     Ferrous Sulfate  (IRON  PO) Take 1 tablet by mouth daily.     insulin  aspart (NOVOLOG ) 100 UNIT/ML injection Inject 6 Units into the skin 3 (three) times daily with meals.     insulin  glargine (LANTUS  SOLOSTAR) 100 UNIT/ML Solostar Pen Inject 20 Units into the skin daily.     ipratropium-albuterol  (DUONEB) 0.5-2.5 (3) MG/3ML SOLN Take 3 mLs by nebulization every 6 (six) hours as needed.     levothyroxine  (SYNTHROID ) 75 MCG tablet Take 75 mcg by mouth daily before breakfast.      MELATONIN PO Take 2 each by mouth at bedtime. Melatonin gummies     metolazone  (ZAROXOLYN ) 2.5 MG  tablet Take 1 tablet (2.5 mg total) by mouth as directed. By the heart failure clinic 4 tablet 0   Multiple Vitamins-Minerals (CENTRUM SILVER) CHEW Chew 2 each by mouth daily.     potassium chloride  SA (KLOR-CON  M) 20 MEQ tablet Take 2 tablets (40 mEq total) by mouth as directed. Take with Metolazone  ONLY 4 tablet 0   rosuvastatin  (CRESTOR ) 10 MG tablet Take 1 tablet (10 mg total) by mouth daily. 30 tablet 0   torsemide  (DEMADEX ) 20 MG tablet Take 2 tablets (40mg ) by mouth twice daily, in the morning and at 4:00pm 180 tablet 0   No current facility-administered medications for this encounter.   Allergies  Allergen Reactions   Ms Contin [Morphine] Hives and Rash        Social History   Socioeconomic History   Marital status: Single    Spouse name: Not on file   Number of children: Not on file   Years of education: Not on file   Highest education level: Not on file  Occupational History   Occupation: disabled  Tobacco Use   Smoking status: Former    Current packs/day: 0.00    Average packs/day: 0.1 packs/day for 30.0 years (1.5 ttl pk-yrs)    Types: Cigarettes    Start date: 73    Quit date: 37    Years since quitting: 35.5   Smokeless tobacco: Never   Tobacco comments:    01/19/2013 quit smoking  cigarettes in the early '90's  Vaping Use   Vaping status: Never Used  Substance and Sexual Activity   Alcohol use: Not Currently    Comment: rare now but never a heavy drinker   Drug use: No   Sexual activity: Never  Other Topics Concern   Not on file  Social History Narrative   Not on file   Social Drivers of Health   Financial Resource Strain: Not on file  Food Insecurity: No Food Insecurity (09/12/2023)   Hunger Vital Sign    Worried About Running Out of Food in the Last Year: Never true    Ran Out of Food in the Last Year: Never true  Transportation Needs: No Transportation Needs (09/12/2023)   PRAPARE - Administrator, Civil Service (Medical): No    Lack of Transportation (Non-Medical): No  Physical Activity: Not on file  Stress: Not on file  Social Connections: Socially Isolated (09/12/2023)   Social Connection and Isolation Panel    Frequency of Communication with Friends and Family: More than three times a week    Frequency of Social Gatherings with Friends and Family: Once a week    Attends Religious Services: Never    Database administrator or Organizations: No    Attends Banker Meetings: Never    Marital Status: Divorced  Catering manager Violence: Not At Risk (09/12/2023)   Humiliation, Afraid, Rape, and Kick questionnaire    Fear of Current or Ex-Partner: No    Emotionally Abused: No    Physically Abused: No    Sexually Abused: No   Family History  Problem Relation Age of Onset   Cerebral aneurysm Mother    Hypertension Father    Cerebral aneurysm Maternal Grandfather    Cerebral aneurysm Maternal Aunt    Cancer Maternal Uncle    BP (!) 160/64   Pulse (!) 56   Ht 5' 1 (1.549 m)   Wt 123.4 kg (272 lb)   SpO2 98%   BMI 51.39 kg/m  Wt Readings from Last 3 Encounters:  10/26/23 123.4 kg (272 lb)  10/06/23 (!) 138.9 kg (306 lb 2 oz)  09/29/23 131 kg (288 lb 12.8 oz)   PHYSICAL EXAM: General:  NAD. No resp difficulty, arrived  in [facility] on oxygen, chronically-ill appearing. HEENT: Normal Neck: Supple. No JVD. Cor: Irregular rate & rhythm. No rubs, gallops or murmurs. Lungs: Clear Abdomen: Soft, obese, nontender, RUQ distended Extremities: No cyanosis, clubbing, rash, edema (ace wraps on legs) Neuro: Alert & oriented x 3, moves all 4 extremities w/o difficulty. Affect pleasant.  ReDs reading: 28%, normal  Device interrogation (personally reviewed): v-pacing 64.4%, 1.4 hrs/day activity  ASSESSMENT & PLAN: 1. Chronic Diastolic Heart Failure: - Echo (9/19): EF 65-70% , moderately increased LVH.  - RHC (12/2018): with preserved cardiac output and low filling pressures.  - PYP scan not convincing for amyloid.  - Echo (6/23): EF 60-65% RV normal. - Echo (5/25): EF 60-65%, moderate concentric LVH, normal RV - R/LHC (6/25): no obstructive CAD; RA 13, PA 62/18 (37), PCWP 11, v waves to 22 mmHg, PVR 4.4. WU, PAPi 3.4 - Improved NYHA II-early III, functional status confounded by body habitus/OSA/OHS. She has had significant function decline since we last saw her in 2023. Volume much improved, weight down 34 lbs, ReDs 28%.  - Would be a good Furoscix  candidate but unfortunately cannot have this in SNF - Continue Unna boots at facility - Continue torsemide  40 mg bid. - Needs weekly weights at facility. - Off spiro & ARB/ARNi with CKD IV - Would avoid SGLT2i w/ concern about hygeine/urinary incontinence.  - ? Cardiomems candidate. - Labs today.   2. 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 CKD Stage IIIb-IV - Baseline SCr 1.6-2 - Struggling with cardio-renal syndrome - She is followed by nephrology. - Labs today  4. Chronic Hypoxic Respiratory Failure - Continue oxygen - Needs BiPAP.   5. HTN - BP not controlled - Increase amlodipine  to 10  mg daily.   6. H/o CHB  - s/p MDT PPM - Device interrogation as above. - She has been referred back to EP to re-establish.  7. Atrial fibrillation - Chronic, was in AF in 2021.  - Rate controlled, slow VR - Continue Eliquis . No bleeding issues.   8. H/o PEs - Now on Eliquis  5 mg bid - No bleeding issues.   9. OSA/OHS  - Unable to tolerate CPAP - Weight prohibits Inspire device - ? Oral device from dentist  10. GOC - Elected for DNR/DNI during recent admission - Followed by Palliative at facility   11. Morbid Obesity - Body mass index is 51.39 kg/m. - Consider GLP1 but will hold off with on-going nausea. - Had been on semaglutide in past.   Follow up in 4 months with Dr. Cherrie Harlene Gainer, FNP-BC. 10/26/23

## 2023-10-26 NOTE — Progress Notes (Signed)
 ReDS Vest / Clip - 10/26/23 1058       ReDS Vest / Clip   Station Marker B    Ruler Value 47    ReDS Value Range Low volume    ReDS Actual Value 28

## 2023-10-26 NOTE — Patient Instructions (Signed)
 Increase amlodipine  to 10 mg daily. Labs today - will call you if abnormal. Weigh weekly. Return to see Dr. Cherrie in Heart Failure Clinic in 4 months. We will call you to schedule. Please call us  at [phone] if any questions or concerns prior to your next visit.

## 2023-10-30 ENCOUNTER — Encounter: Payer: Self-pay | Admitting: Internal Medicine

## 2023-11-02 ENCOUNTER — Encounter (HOSPITAL_COMMUNITY): Payer: Self-pay | Admitting: Emergency Medicine

## 2023-11-02 ENCOUNTER — Emergency Department (HOSPITAL_COMMUNITY)
Admission: EM | Admit: 2023-11-02 | Discharge: 2023-11-05 | Disposition: A | Discharge status: Other Acute Inpatient Hospital | Attending: Emergency Medicine | Admitting: Emergency Medicine

## 2023-11-02 ENCOUNTER — Emergency Department (HOSPITAL_COMMUNITY)

## 2023-11-02 ENCOUNTER — Other Ambulatory Visit: Payer: Self-pay

## 2023-11-02 DIAGNOSIS — T50902A Poisoning by unspecified drugs, medicaments and biological substances, intentional self-harm, initial encounter: Secondary | ICD-10-CM | POA: Diagnosis not present

## 2023-11-02 DIAGNOSIS — Z794 Long term (current) use of insulin: Secondary | ICD-10-CM | POA: Diagnosis not present

## 2023-11-02 DIAGNOSIS — T1491XA Suicide attempt, initial encounter: Secondary | ICD-10-CM | POA: Insufficient documentation

## 2023-11-02 DIAGNOSIS — N3001 Acute cystitis with hematuria: Secondary | ICD-10-CM | POA: Diagnosis not present

## 2023-11-02 DIAGNOSIS — X58XXXA Exposure to other specified factors, initial encounter: Secondary | ICD-10-CM | POA: Diagnosis not present

## 2023-11-02 DIAGNOSIS — Z79899 Other long term (current) drug therapy: Secondary | ICD-10-CM | POA: Insufficient documentation

## 2023-11-02 DIAGNOSIS — Z9981 Dependence on supplemental oxygen: Secondary | ICD-10-CM | POA: Insufficient documentation

## 2023-11-02 DIAGNOSIS — Z7901 Long term (current) use of anticoagulants: Secondary | ICD-10-CM | POA: Insufficient documentation

## 2023-11-02 DIAGNOSIS — I119 Hypertensive heart disease without heart failure: Secondary | ICD-10-CM | POA: Insufficient documentation

## 2023-11-02 DIAGNOSIS — Z86718 Personal history of other venous thrombosis and embolism: Secondary | ICD-10-CM | POA: Diagnosis not present

## 2023-11-02 DIAGNOSIS — T402X2A Poisoning by other opioids, intentional self-harm, initial encounter: Secondary | ICD-10-CM | POA: Diagnosis not present

## 2023-11-02 DIAGNOSIS — R45851 Suicidal ideations: Secondary | ICD-10-CM | POA: Diagnosis not present

## 2023-11-02 DIAGNOSIS — Z950 Presence of cardiac pacemaker: Secondary | ICD-10-CM | POA: Insufficient documentation

## 2023-11-02 DIAGNOSIS — R079 Chest pain, unspecified: Secondary | ICD-10-CM | POA: Diagnosis present

## 2023-11-02 DIAGNOSIS — I700 Atherosclerosis of aorta: Secondary | ICD-10-CM | POA: Diagnosis not present

## 2023-11-02 DIAGNOSIS — I110 Hypertensive heart disease with heart failure: Secondary | ICD-10-CM | POA: Insufficient documentation

## 2023-11-02 DIAGNOSIS — F322 Major depressive disorder, single episode, severe without psychotic features: Secondary | ICD-10-CM | POA: Insufficient documentation

## 2023-11-02 DIAGNOSIS — E119 Type 2 diabetes mellitus without complications: Secondary | ICD-10-CM | POA: Insufficient documentation

## 2023-11-02 DIAGNOSIS — I5032 Chronic diastolic (congestive) heart failure: Secondary | ICD-10-CM | POA: Insufficient documentation

## 2023-11-02 LAB — URINALYSIS, ROUTINE W REFLEX MICROSCOPIC
Bilirubin Urine: NEGATIVE
Glucose, UA: NEGATIVE mg/dL
Ketones, ur: NEGATIVE mg/dL
Nitrite: NEGATIVE
Protein, ur: 100 mg/dL — AB
Specific Gravity, Urine: 1.012 (ref 1.005–1.030)
WBC, UA: >50 WBC/hpf (ref 0–5)
pH: 7 (ref 5.0–8.0)

## 2023-11-02 LAB — COMPREHENSIVE METABOLIC PANEL WITH GFR
ALT: 25 U/L (ref 0–44)
AST: 32 U/L (ref 15–41)
Albumin: 3.3 g/dL — ABNORMAL LOW (ref 3.5–5.0)
Alkaline Phosphatase: 66 U/L (ref 38–126)
Anion gap: 13 (ref 5–15)
BUN: 67 mg/dL — ABNORMAL HIGH (ref 8–23)
CO2: 25 mmol/L (ref 22–32)
Calcium: 9.1 mg/dL (ref 8.9–10.3)
Chloride: 103 mmol/L (ref 98–111)
Creatinine, Ser: 2.28 mg/dL — ABNORMAL HIGH (ref 0.44–1.00)
GFR, Estimated: 23 mL/min — ABNORMAL LOW (ref >60)
Glucose, Bld: 162 mg/dL — ABNORMAL HIGH (ref 70–99)
Potassium: 4.4 mmol/L (ref 3.5–5.1)
Sodium: 141 mmol/L (ref 135–145)
Total Bilirubin: 0.8 mg/dL (ref 0.0–1.2)
Total Protein: 6.1 g/dL — ABNORMAL LOW (ref 6.5–8.1)

## 2023-11-02 LAB — I-STAT VENOUS BLOOD GAS, ED
Acid-Base Excess: 4 mmol/L — ABNORMAL HIGH (ref 0.0–2.0)
Bicarbonate: 27.8 mmol/L (ref 20.0–28.0)
Calcium, Ion: 1.11 mmol/L — ABNORMAL LOW (ref 1.15–1.40)
HCT: 27 % — ABNORMAL LOW (ref 36.0–46.0)
Hemoglobin: 9.2 g/dL — ABNORMAL LOW (ref 12.0–15.0)
O2 Saturation: 88 %
Potassium: 4.2 mmol/L (ref 3.5–5.1)
Sodium: 140 mmol/L (ref 135–145)
TCO2: 29 mmol/L (ref 22–32)
pCO2, Ven: 37.8 mmHg — ABNORMAL LOW (ref 44–60)
pH, Ven: 7.475 — ABNORMAL HIGH (ref 7.25–7.43)
pO2, Ven: 51 mmHg — ABNORMAL HIGH (ref 32–45)

## 2023-11-02 LAB — SALICYLATE LEVEL: Salicylate Lvl: <7 mg/dL — ABNORMAL LOW (ref 7.0–30.0)

## 2023-11-02 LAB — CBC WITH DIFFERENTIAL/PLATELET
Abs Immature Granulocytes: 0 K/uL (ref 0.00–0.07)
Basophils Absolute: 0.1 K/uL (ref 0.0–0.1)
Basophils Relative: 1 %
Eosinophils Absolute: 0 K/uL (ref 0.0–0.5)
Eosinophils Relative: 0 %
HCT: 29.7 % — ABNORMAL LOW (ref 36.0–46.0)
Hemoglobin: 8.6 g/dL — ABNORMAL LOW (ref 12.0–15.0)
Lymphocytes Relative: 4 %
Lymphs Abs: 0.4 K/uL — ABNORMAL LOW (ref 0.7–4.0)
MCH: 32.3 pg (ref 26.0–34.0)
MCHC: 29 g/dL — ABNORMAL LOW (ref 30.0–36.0)
MCV: 111.7 fL — ABNORMAL HIGH (ref 80.0–100.0)
Monocytes Absolute: 0.2 K/uL (ref 0.1–1.0)
Monocytes Relative: 2 %
Neutro Abs: 8.6 K/uL — ABNORMAL HIGH (ref 1.7–7.7)
Neutrophils Relative %: 93 %
Platelets: 176 K/uL (ref 150–400)
RBC: 2.66 MIL/uL — ABNORMAL LOW (ref 3.87–5.11)
RDW: 16.9 % — ABNORMAL HIGH (ref 11.5–15.5)
WBC: 9.2 K/uL (ref 4.0–10.5)
nRBC: 0 % (ref 0.0–0.2)
nRBC: 0 /100{WBCs}

## 2023-11-02 LAB — RAPID URINE DRUG SCREEN, HOSP PERFORMED
Amphetamines: NOT DETECTED
Barbiturates: NOT DETECTED
Benzodiazepines: POSITIVE — AB
Cocaine: NOT DETECTED
Opiates: POSITIVE — AB
Tetrahydrocannabinol: NOT DETECTED

## 2023-11-02 LAB — I-STAT CG4 LACTIC ACID, ED
Lactic Acid, Venous: 1.2 mmol/L (ref 0.5–1.9)
Lactic Acid, Venous: 0.8 mmol/L (ref 0.5–1.9)

## 2023-11-02 LAB — T4, FREE: Free T4: 0.8 ng/dL (ref 0.61–1.12)

## 2023-11-02 LAB — CBG MONITORING, ED: Glucose-Capillary: 158 mg/dL — ABNORMAL HIGH (ref 70–99)

## 2023-11-02 LAB — ACETAMINOPHEN LEVEL: Acetaminophen (Tylenol), Serum: <10 ug/mL — ABNORMAL LOW (ref 10–30)

## 2023-11-02 LAB — TSH: TSH: 25.014 u[IU]/mL — ABNORMAL HIGH (ref 0.350–4.500)

## 2023-11-02 LAB — ETHANOL: Alcohol, Ethyl (B): <15 mg/dL (ref <15)

## 2023-11-02 MED ORDER — SODIUM CHLORIDE 0.9 % IV SOLN
2.0000 g | Freq: Once | INTRAVENOUS | Status: AC
  Administered 2023-11-02: 2 g via INTRAVENOUS
  Filled 2023-11-02: qty 20

## 2023-11-02 MED ORDER — LACTATED RINGERS IV BOLUS
1000.0000 mL | Freq: Once | INTRAVENOUS | Status: AC
  Administered 2023-11-02: 1000 mL via INTRAVENOUS

## 2023-11-02 NOTE — ED Notes (Signed)
 Pt cleaned, put in gown, and intermittent cath procedure done with [facility]. Pt tolerated well and is resting comfortably.

## 2023-11-02 NOTE — ED Provider Notes (Signed)
 I saw and evaluated the patient, reviewed the resident's note and I agree with the findings and plan.    Patient presented after intentional overdose of OxyContin  yesterday.  States that she is tired of living due to her current social situation.  Denies any ingestions today.  No other coingestions.  Will attempt to medically clear for psychiatric referral   Leah Faden, MD 11/02/23 1230

## 2023-11-02 NOTE — ED Notes (Signed)
 Pt in bed, pt denies pain, pt requests ginger ale, pt has no other requests

## 2023-11-02 NOTE — BH Assessment (Signed)
 This patient has been referred to [facility] telecare for her teleassessment.  Coordinator for [facility] will contact us  regarding time and provider to see patient.

## 2023-11-02 NOTE — Consult Note (Addendum)
  TTS consulted due to overdose via 40 oxycodone  pills last night.  Per chart review home nurse came by this a.m. and called 911.  Patient currently laying in her bed asleep.  Patient briefly arouses but continues to fall back to sleep.  Assessment is not able to be completed at this time due to patient's condition. Will notify TTS to attempt later.

## 2023-11-02 NOTE — ED Provider Notes (Signed)
 [place] [facility] AT [facility] Provider Note   CSN: [id number] Arrival date & time: 11/02/23  1221     Patient presents with: Drug Overdose   [name] is a 68 y.o. female with past medical history significant for hypertension, type 2 diabetes, atrial fibrillation, DVT/PE status post IVC filter, HFpEF, single-chamber PPM and chronic pain who presents emergency department for intentional ingestion.  History was obtained from the patient.  Patient states that approximately 8 to 9 PM on 7/27 patient ingested approximately 40 pills of 5 mg oxycodone  in an attempt to harm herself.  Patient denies any coingestants.  Patient states she awoke this a.m. and her home nurse called 911.  Currently patient does not endorse any new symptoms including chest pain, shortness of breath, abdominal pain, nausea, vomiting.  Patient does endorse suicidal ideation and intentional ingestion    Drug Overdose       Prior to Admission medications   Medication Sig Start Date End Date Taking? Authorizing Provider  allopurinol  (ZYLOPRIM ) 100 MG tablet Take 50 mg by mouth every other day. 10/28/23   [provider]  allopurinol  (ZYLOPRIM ) 300 MG tablet Take 300 mg by mouth daily.    [provider]  amLODipine  (NORVASC ) 5 MG tablet Take 2 tablets (10 mg total) by mouth daily. 10/26/23   Milford, Harlene HERO, FNP  apixaban  (ELIQUIS ) 5 MG TABS tablet Take 5 mg by mouth 2 (two) times daily.    [provider]  benzonatate  (TESSALON ) 100 MG capsule Take 1 capsule (100 mg total) by mouth every 8 (eight) hours. 10/04/23   Jerral Meth, MD  Biotin  w/ Vitamins C & E (HAIR SKIN & NAILS GUMMIES PO) Take 2 each by mouth daily at 4 PM.    [provider]  cephALEXin  (KEFLEX ) 500 MG capsule Take 500 mg by mouth 2 (two) times daily.    [provider]  citalopram  (CELEXA ) 20 MG tablet Take 20 mg by mouth at bedtime.    [provider]   citalopram  (CELEXA ) 40 MG tablet Take 40 mg by mouth at bedtime. 10/28/23   [provider]  diazepam  (VALIUM ) 5 MG tablet Take 1 tablet (5 mg total) by mouth 2 (two) times daily as needed for anxiety or muscle spasms. 09/16/23   Arrien, Mauricio Daniel, MD  diphenhydramine -acetaminophen  (TYLENOL  PM) 25-500 MG TABS tablet Take 1 tablet by mouth at bedtime.    [provider]  Ferrous Sulfate  (IRON  PO) Take 1 tablet by mouth daily.    [provider]  guaiFENesin -dextromethorphan  (ROBITUSSIN DM) 100-10 MG/5ML syrup Take by mouth in the morning, at noon, and at bedtime. 10/28/23   [provider]  insulin  aspart (NOVOLOG ) 100 UNIT/ML injection Inject 6 Units into the skin 3 (three) times daily with meals. 09/15/23   Fairy Frames, MD  insulin  glargine (LANTUS  SOLOSTAR) 100 UNIT/ML Solostar Pen Inject 20 Units into the skin daily.    [provider]  ipratropium-albuterol  (DUONEB) 0.5-2.5 (3) MG/3ML SOLN Take 3 mLs by nebulization every 6 (six) hours as needed.    [provider]  levothyroxine  (SYNTHROID ) 75 MCG tablet Take 75 mcg by mouth daily before breakfast.     [provider]  MELATONIN PO Take 2 each by mouth at bedtime. Melatonin gummies    [provider]  metolazone  (ZAROXOLYN ) 2.5 MG tablet Take 1 tablet (2.5 mg total) by mouth as directed. By the heart failure clinic 10/06/23 01/04/24  Glena Harlene HERO,  FNP  Multiple Vitamins-Minerals (CENTRUM SILVER) CHEW Chew 2 each by mouth daily.    [provider]  NOVOLOG  FLEXPEN 100 UNIT/ML FlexPen Inject 6 Units into the skin 3 (three) times daily with meals. 10/28/23   [provider]  oxyCODONE  (OXY IR/ROXICODONE ) 5 MG immediate release tablet Take 5 mg by mouth every 6 (six) hours as needed for severe pain (pain score 7-10). 10/28/23   [provider]  potassium chloride  SA (KLOR-CON  M) 20 MEQ tablet Take 2 tablets (40 mEq total) by mouth as directed.  Take with Metolazone  ONLY 10/06/23   Milford, Harlene HERO, FNP  rosuvastatin  (CRESTOR ) 10 MG tablet Take 1 tablet (10 mg total) by mouth daily. 05/09/16   Epifanio Rolland Robin, MD  torsemide  (DEMADEX ) 20 MG tablet Take 2 tablets (40mg ) by mouth twice daily, in the morning and at 4:00pm 09/08/23   Danford, Lonni SQUIBB, MD    Allergies: Ms contin [morphine]    Review of Systems Reviewed in HPI  Updated Vital Signs BP 138/64   Pulse (!) 48   Temp (!) 97.4 F (36.3 C) (Oral)   Resp 15   Ht 5' 1 (1.549 m)   Wt 123.4 kg   SpO2 100%   BMI 51.40 kg/m   Physical Exam Vitals and nursing note reviewed.  Constitutional:      General: She is not in acute distress.    Appearance: She is morbidly obese.     Comments: Chronically ill-appearing 3 L nasal cannula baseline supplemental oxygen  HENT:     Head: Normocephalic and atraumatic.  Eyes:     General: No visual field deficit.    Pupils: Pupils are equal, round, and reactive to light.  Cardiovascular:     Rate and Rhythm: Regular rhythm. Bradycardia present.     Pulses:          Radial pulses are 2+ on the right side and 2+ on the left side.     Heart sounds: Normal heart sounds. No murmur heard. Abdominal:     Palpations: Abdomen is soft.     Tenderness: There is no abdominal tenderness.  Neurological:     Mental Status: She is alert.     Cranial Nerves: No cranial nerve deficit, dysarthria or facial asymmetry.     Sensory: Sensation is intact.     Motor: No weakness, tremor or seizure activity.     Deep Tendon Reflexes: Reflexes normal.     Comments: No myoclonus on examination  Psychiatric:        Behavior: Behavior is cooperative.        Thought Content: Thought content is not paranoid or delusional. Thought content includes suicidal ideation. Thought content does not include homicidal ideation. Thought content includes suicidal plan. Thought content does not include homicidal plan.     (all labs ordered are listed, but  only abnormal results are displayed) Labs Reviewed  CBC WITH DIFFERENTIAL/PLATELET - Abnormal; Notable for the following components:      Result Value   RBC 2.66 (*)    Hemoglobin 8.6 (*)    HCT 29.7 (*)    MCV 111.7 (*)    MCHC 29.0 (*)    RDW 16.9 (*)    Neutro Abs 8.6 (*)    Lymphs Abs 0.4 (*)    All other components within normal limits  COMPREHENSIVE METABOLIC PANEL WITH GFR - Abnormal; Notable for the following components:   Glucose, Bld 162 (*)    BUN 67 (*)  Creatinine, Ser 2.28 (*)    Total Protein 6.1 (*)    Albumin  3.3 (*)    GFR, Estimated 23 (*)    All other components within normal limits  ACETAMINOPHEN  LEVEL - Abnormal; Notable for the following components:   Acetaminophen  (Tylenol ), Serum <10 (*)    All other components within normal limits  SALICYLATE LEVEL - Abnormal; Notable for the following components:   Salicylate Lvl <7.0 (*)    All other components within normal limits  RAPID URINE DRUG SCREEN, HOSP PERFORMED - Abnormal; Notable for the following components:   Opiates POSITIVE (*)    Benzodiazepines POSITIVE (*)    All other components within normal limits  TSH - Abnormal; Notable for the following components:   TSH 25.014 (*)    All other components within normal limits  URINALYSIS, ROUTINE W REFLEX MICROSCOPIC - Abnormal; Notable for the following components:   Color, Urine AMBER (*)    APPearance TURBID (*)    Hgb urine dipstick SMALL (*)    Protein, ur 100 (*)    Leukocytes,Ua LARGE (*)    Bacteria, UA MANY (*)    All other components within normal limits  I-STAT VENOUS BLOOD GAS, ED - Abnormal; Notable for the following components:   pH, Ven 7.475 (*)    pCO2, Ven 37.8 (*)    pO2, Ven 51 (*)    Acid-Base Excess 4.0 (*)    Calcium , Ion 1.11 (*)    HCT 27.0 (*)    Hemoglobin 9.2 (*)    All other components within normal limits  ETHANOL  T4, FREE  I-STAT CG4 LACTIC ACID, ED  I-STAT CG4 LACTIC ACID, ED    EKG: None  Radiology: DG  Chest Portable 1 View Result Date: 11/02/2023 CLINICAL DATA:  Concern for aspiration.  Overdose. EXAM: PORTABLE CHEST 1 VIEW COMPARISON:  10/04/2023. FINDINGS: Stable cardiomegaly with central pulmonary vascular congestion. Stable left chest wall single lead pacemaker. Aortic atherosclerosis. Mild bibasilar atelectasis. No confluent focal consolidation, sizeable pleural effusion, or pneumothorax. No acute osseous abnormality. IMPRESSION: 1. Mild bibasilar atelectasis. 2. Cardiomegaly with central pulmonary vascular congestion. Electronically Signed   By: Harrietta Sherry M.D.   On: 11/02/2023 13:42     Procedures   Medications Ordered in the ED  lactated ringers  bolus 1,000 mL (1,000 mLs Intravenous New Bag/Given 11/02/23 1433)  cefTRIAXone  (ROCEPHIN ) 2 g in sodium chloride  0.9 % 100 mL IVPB (2 g Intravenous New Bag/Given 11/02/23 1504)    Clinical Course as of 11/02/23 1618  Mon Nov 02, 2023  1436 Hospitalist consulted  [AG]  1452 h/o obesity, HTN, DM, atrial fib, DVT, PE x3 , s/p IVC filter placed in 2013, CHB s/p MDT single chamber PPM 08/2018, and chronic diastolic heart failure [AG]  1542 45mg  oxy last night in attempt self harm Uti, aki. Rocephin  fluids  [RC]  1552 Poison control contacted without need for further monitoring at this time and patient is medically cleared [AG]    Clinical Course User Index [AG] Nada Chroman, DO [RC] Sharyne Darina RAMAN, MD                                 Medical Decision Making Amount and/or Complexity of Data Reviewed Labs: ordered. Radiology: ordered.   On initial evaluation patient is bradycardic, normotensive and on baseline oxygen at 3 L nasal cannula.  Based upon patient's history and physical examination findings differential diagnosis  includes intentional versus accidental ingestion, underlying metabolic abnormality including hypothyroidism, infection/sepsis.  Point-of-care glucose was within normal limits.  Based upon patient's initial  presentation, will obtain EKG as well as laboratory studies to include coingestants.  Laboratory studies without evidence of abnormal acetaminophen , salicylate levels.  UDS positive for benzodiazepines and opiates.  EKG at patient's baseline bradycardia without acute changes.  Urinalysis suggestive of infection therefore will give Rocephin .  Poison control contacted, based upon patient's timing of ingestion no further observation period or workup necessary at this time.  As patient is medically cleared, will consult psychiatry for suicidal ideation with intentional ingestion.  Patient was handed off to incoming team with psychiatry evaluation pending     Final diagnoses:  Suicide attempt [facility])  Acute cystitis with hematuria    ED Discharge Orders     None      Lavanda Bolster DO  Emergency Medicine PGY2    Bolster Lavanda, DO 11/02/23 1618    Dasie Faden, MD 11/03/23 [phone]

## 2023-11-02 NOTE — ED Triage Notes (Signed)
 Pt arrived via [facility] from home. Pt reports taking approximately 40 oxycodone  last night before going to bed. Hospice nurse came to visit this morning and found the patient had taken a lot of oxycodone  and called EMS. Pt reports that she was trying to kill herself.

## 2023-11-02 NOTE — ED Notes (Signed)
 Hospice nurse came by to check in; got number from them just in case in any need for updates.

## 2023-11-02 NOTE — ED Notes (Signed)
Called staffing, no sitters available at this time.  

## 2023-11-03 DIAGNOSIS — F322 Major depressive disorder, single episode, severe without psychotic features: Secondary | ICD-10-CM | POA: Diagnosis not present

## 2023-11-03 DIAGNOSIS — T50902A Poisoning by unspecified drugs, medicaments and biological substances, intentional self-harm, initial encounter: Secondary | ICD-10-CM | POA: Diagnosis not present

## 2023-11-03 LAB — CBG MONITORING, ED
Glucose-Capillary: 138 mg/dL — ABNORMAL HIGH (ref 70–99)
Glucose-Capillary: 167 mg/dL — ABNORMAL HIGH (ref 70–99)
Glucose-Capillary: 148 mg/dL — ABNORMAL HIGH (ref 70–99)

## 2023-11-03 MED ORDER — ONDANSETRON 4 MG PO TBDP
4.0000 mg | ORAL_TABLET | Freq: Once | ORAL | Status: AC
  Administered 2023-11-03: 4 mg via ORAL
  Filled 2023-11-03: qty 1

## 2023-11-03 MED ORDER — TORSEMIDE 20 MG PO TABS
40.0000 mg | ORAL_TABLET | Freq: Two times a day (BID) | ORAL | Status: DC
  Administered 2023-11-03 – 2023-11-05 (×5): 40 mg via ORAL
  Filled 2023-11-03 (×5): qty 2

## 2023-11-03 MED ORDER — ALLOPURINOL 100 MG PO TABS
50.0000 mg | ORAL_TABLET | ORAL | Status: DC
  Administered 2023-11-03 – 2023-11-05 (×2): 50 mg via ORAL
  Filled 2023-11-03 (×2): qty 1

## 2023-11-03 MED ORDER — APIXABAN 5 MG PO TABS
5.0000 mg | ORAL_TABLET | Freq: Two times a day (BID) | ORAL | Status: DC
  Administered 2023-11-03 – 2023-11-05 (×5): 5 mg via ORAL
  Filled 2023-11-03 (×5): qty 1

## 2023-11-03 MED ORDER — BIOTIN W/ VITAMINS C & E 1250-7.5-3.375 MCG-MG-MG PO CHEW: CHEWABLE_TABLET | Freq: Every day | ORAL | Status: DC

## 2023-11-03 MED ORDER — AMLODIPINE BESYLATE 5 MG PO TABS
10.0000 mg | ORAL_TABLET | Freq: Every day | ORAL | Status: DC
  Administered 2023-11-03 – 2023-11-05 (×2): 10 mg via ORAL
  Filled 2023-11-03 (×3): qty 2

## 2023-11-03 MED ORDER — GUAIFENESIN-DM 100-10 MG/5ML PO SYRP: 5.0000 mL | ORAL_SOLUTION | Freq: Three times a day (TID) | ORAL | Status: DC | PRN

## 2023-11-03 MED ORDER — LEVOTHYROXINE SODIUM 75 MCG PO TABS
75.0000 ug | ORAL_TABLET | Freq: Every day | ORAL | Status: DC
  Administered 2023-11-03 – 2023-11-05 (×3): 75 ug via ORAL
  Filled 2023-11-03 (×3): qty 1

## 2023-11-03 MED ORDER — CITALOPRAM HYDROBROMIDE 10 MG PO TABS
40.0000 mg | ORAL_TABLET | Freq: Every day | ORAL | Status: DC
  Administered 2023-11-03 – 2023-11-04 (×2): 40 mg via ORAL
  Filled 2023-11-03 (×2): qty 4

## 2023-11-03 MED ORDER — INSULIN ASPART 100 UNIT/ML IJ SOLN
0.0000 [IU] | Freq: Three times a day (TID) | INTRAMUSCULAR | Status: DC
  Administered 2023-11-03: 2 [IU] via SUBCUTANEOUS
  Administered 2023-11-03: 3 [IU] via SUBCUTANEOUS
  Administered 2023-11-04: 5 [IU] via SUBCUTANEOUS
  Administered 2023-11-04: 3 [IU] via SUBCUTANEOUS
  Administered 2023-11-04: 2 [IU] via SUBCUTANEOUS
  Administered 2023-11-05: 3 [IU] via SUBCUTANEOUS
  Administered 2023-11-05: 5 [IU] via SUBCUTANEOUS

## 2023-11-03 MED ORDER — ADULT MULTIVITAMIN W/MINERALS CH
1.0000 | ORAL_TABLET | Freq: Every day | ORAL | Status: DC
  Administered 2023-11-03 – 2023-11-05 (×3): 1 via ORAL
  Filled 2023-11-03 (×3): qty 1

## 2023-11-03 MED ORDER — CEPHALEXIN 250 MG PO CAPS
500.0000 mg | ORAL_CAPSULE | Freq: Two times a day (BID) | ORAL | Status: DC
  Administered 2023-11-03 – 2023-11-05 (×5): 500 mg via ORAL
  Filled 2023-11-03 (×5): qty 2

## 2023-11-03 MED ORDER — OXYCODONE HCL 5 MG PO TABS: 5.0000 mg | ORAL_TABLET | Freq: Four times a day (QID) | ORAL | Status: DC | PRN

## 2023-11-03 MED ORDER — ROSUVASTATIN CALCIUM 5 MG PO TABS
10.0000 mg | ORAL_TABLET | Freq: Every day | ORAL | Status: DC
  Administered 2023-11-03 – 2023-11-05 (×3): 10 mg via ORAL
  Filled 2023-11-03 (×3): qty 2

## 2023-11-03 MED ORDER — DIAZEPAM 5 MG PO TABS: 5.0000 mg | ORAL_TABLET | Freq: Two times a day (BID) | ORAL | Status: DC | PRN

## 2023-11-03 MED ORDER — FERROUS SULFATE 325 (65 FE) MG PO TABS
325.0000 mg | ORAL_TABLET | Freq: Every day | ORAL | Status: DC
  Administered 2023-11-03 – 2023-11-05 (×3): 325 mg via ORAL
  Filled 2023-11-03 (×3): qty 1

## 2023-11-03 MED ORDER — MELATONIN 5 MG PO TABS
10.0000 mg | ORAL_TABLET | Freq: Every day | ORAL | Status: DC
  Administered 2023-11-03 – 2023-11-04 (×2): 10 mg via ORAL
  Filled 2023-11-03 (×2): qty 2

## 2023-11-03 MED ORDER — INSULIN ASPART 100 UNIT/ML IJ SOLN: 0.0000 [IU] | Freq: Every day | INTRAMUSCULAR | Status: DC

## 2023-11-03 NOTE — Progress Notes (Signed)
 Inpatient Psychiatric Referral  Patient was recommended inpatient per Elveria Batter, NP . There are no available beds at [facility], per [facility] Ms Cherylynn Ernst, RN. Patient was re-faxed to the following out of network facilities:  Destination  Service Provider Request Status Address Phone Fax  [facility]  Pending - Request Sent [address], [place] [address] [phone]  [facility]  Pending - Request Sent [address] [place] [address] [phone]  [facility] Patient Placement  Pending - Request [facility] [place] [phone]  [facility]  Pending - Request Sent [address] [place] [address] [phone]  [facility]  Pending - Request Sent [address] [place] [address] [phone]  [facility]  Pending - Request Sent [address] [place] [address] [phone]  [facility]  Pending - Request Sent [address] [place] [address] [phone]  [facility]  Pending - Request Sent [address], [place] [address] [phone]  [name]  Pending - Request Sent [address] [place] [address] [phone]  [facility]  Pending - Request Sent [address] [place] [phone]  [facility]  Pending - Request Sent [address] [place] [address] [phone]  [facility]  Pending - Request [facility] [address] [place] [address] [phone]  [facility]  Pending - Request Sent [address] [place], [place] [address] [phone]  [facility]  Pending - Request Sent [address], [place] [address] [phone]    Situation ongoing, CSW to continue following and update chart as more information becomes available.   Harrie Sofia MSW, LCSWA 11/03/2023  6:06PM

## 2023-11-03 NOTE — Progress Notes (Deleted)
  Electrophysiology Office Note:    Date:  11/03/2023   ID:  [name], DOB 1955-11-09, MRN [id number]  PCP:  Valma Carwin, MD   [facility] Providers Cardiologist:  Soyla DELENA Merck, MD Sleep Medicine:  Wilbert Bihari, MD { Click to update primary MD,subspecialty MD or APP then REFRESH:1}    Referring MD: Abigail Raphael SAILOR, PA-C   History of Present Illness:    [name] is a 68 y.o. female with a medical history significant for atrial fibrillation, complete heart block status post Medtronic single chamber pacemaker, hypertension, diabetes, DVT, pulmonary emboli s/p IVC filter, referred for ***.     She has a Medtronic single-chamber pacemaker placed in May 2020 after episodes of presyncope and syncope resulting in a fall, attributed to complete heart block.  She has had admissions for CHF with preserved ejection fraction.   In 2022, her device was programmed to a lower rate due to asynchronous ventricular pacing.  She is admitted in May 2025 with acute on chronic diastolic heart failure.  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  She continues to have issues managing her weight and fluid volume.  Her device interrogation shows that she has been on percent V paced since approximately April 2025.  EKGs appear to show longstanding persistent atrial fibrillation.  Discussed the use of AI scribe software for clinical note transcription with the patient, who gave verbal consent to proceed.  History of Present Illness          Today, ***  EKGs/Labs/Other Studies Reviewed Today:     Echocardiogram:  TTE Sep 04, 2023 LVEF 60 to 65%.  Mildly dilated LV cavity.  Moderate concentric LVH.  Normal RV size.  Moderate biatrial dilation.  Moderate to  severe mitral valve calcification without evidence of regurgitation.    Cardiac catherization  Right/left 09/03/3033 Reviewed in Epic  EKG:         Physical Exam:    VS:  There were no vitals taken for this visit.    Wt Readings from Last 3 Encounters:  11/02/23 272 lb 0.8 oz (123.4 kg)  10/26/23 272 lb (123.4 kg)  10/06/23 (!) 306 lb 2 oz (138.9 kg)     GEN: *** Well nourished, well developed in no acute distress CARDIAC: ***RRR, no murmurs, rubs, gallops RESPIRATORY:  Normal work of breathing MUSCULOSKELETAL: *** edema    ASSESSMENT & PLAN:     Longstanding persistent atrial fibrillation Rate is controlled with complete heart block Single-chamber pacemaker in place  Medtronic single-chamber pacemaker She is now 100% V paced  CHFpEF Preserved LV output and low filling pressures. 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

## 2023-11-03 NOTE — ED Notes (Signed)
 Pt calmly conversing with a visitor
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
## 2023-11-03 NOTE — Progress Notes (Signed)
 LCSW Progress Note  [id number]   [name]  11/03/2023  11:03 AM  Description:   Inpatient Psychiatric Referral  Patient was recommended inpatient per Elveria Batter NP. There are no available beds at [facility], per [facility] Carlo RN). Patient was referred to the following out of network facilities:    [facility] Provider Address Phone Fax  [facility]  [address] [place] [address] [phone]  [facility]  [address], [place] [address] [phone]  [facility] Patient Placement  [facility], [place] [phone]  [facility]  [address] [place] [address] [phone]  [facility]  [address] [place], [place] [address] [phone]  [facility]  [address] [place], [place] [address] [phone]  [facility]  [address] [place] [address] [phone]  [facility]  [address], [place] [address] [phone]  [facility]  [address] [place] [address] [phone]  [facility] EFAX  [address] [place], [place] [phone]  [facility]  [address] [place] [address] [phone]  [facility]  [address], [place] [address] [phone]  [facility]  [address] [place], [place] [address] [phone]      Situation ongoing, CSW to continue following and update chart as more information becomes available.      [place] Manolito Jurewicz, MSW, LCSW  11/03/2023 11:03 AM

## 2023-11-03 NOTE — ED Provider Notes (Addendum)
 Emergency Medicine Observation Re-evaluation Note  [name] is a 68 y.o. female, seen on rounds today.  Pt initially presented to the ED for complaints of Drug Overdose Currently, the patient is resting.  Physical Exam  BP (!) 140/49 (BP Location: Left Wrist)   Pulse (!) 108   Temp 97.8 F (36.6 C) (Oral)   Resp 15   Ht 5' 1 (1.549 m)   Wt 123.4 kg   SpO2 98%   BMI 51.40 kg/m  Physical Exam General: No distress Lungs: Normal effort Psych: Depressed  ED Course / MDM  EKG:   I have reviewed the labs performed to date as well as medications administered while in observation.  Recent changes in the last 24 hours include Rocephin  for UTI.  I have ordered her home meds today and will order antibiotics.  Plan  Current plan is for inpatient psychiatric admission.  Will also order home meds and antibiotics. Creatinine clearance is 46.    Freddi Hamilton, MD 11/03/23 9153    Freddi Hamilton, MD 11/03/23 [phone]

## 2023-11-04 ENCOUNTER — Ambulatory Visit: Attending: Cardiovascular Disease | Admitting: Cardiovascular Disease

## 2023-11-04 ENCOUNTER — Encounter (HOSPITAL_COMMUNITY): Payer: Self-pay | Admitting: Psychiatry

## 2023-11-04 DIAGNOSIS — T402X2A Poisoning by other opioids, intentional self-harm, initial encounter: Secondary | ICD-10-CM | POA: Diagnosis not present

## 2023-11-04 DIAGNOSIS — Z7901 Long term (current) use of anticoagulants: Secondary | ICD-10-CM | POA: Diagnosis not present

## 2023-11-04 DIAGNOSIS — T50902A Poisoning by unspecified drugs, medicaments and biological substances, intentional self-harm, initial encounter: Secondary | ICD-10-CM

## 2023-11-04 DIAGNOSIS — Z950 Presence of cardiac pacemaker: Secondary | ICD-10-CM | POA: Diagnosis not present

## 2023-11-04 DIAGNOSIS — I700 Atherosclerosis of aorta: Secondary | ICD-10-CM | POA: Diagnosis not present

## 2023-11-04 DIAGNOSIS — I5032 Chronic diastolic (congestive) heart failure: Secondary | ICD-10-CM | POA: Diagnosis not present

## 2023-11-04 DIAGNOSIS — Z794 Long term (current) use of insulin: Secondary | ICD-10-CM | POA: Diagnosis not present

## 2023-11-04 DIAGNOSIS — F322 Major depressive disorder, single episode, severe without psychotic features: Secondary | ICD-10-CM | POA: Diagnosis not present

## 2023-11-04 DIAGNOSIS — R079 Chest pain, unspecified: Secondary | ICD-10-CM | POA: Diagnosis present

## 2023-11-04 DIAGNOSIS — I110 Hypertensive heart disease with heart failure: Secondary | ICD-10-CM | POA: Diagnosis not present

## 2023-11-04 DIAGNOSIS — X58XXXA Exposure to other specified factors, initial encounter: Secondary | ICD-10-CM | POA: Diagnosis not present

## 2023-11-04 DIAGNOSIS — E119 Type 2 diabetes mellitus without complications: Secondary | ICD-10-CM | POA: Diagnosis not present

## 2023-11-04 DIAGNOSIS — R45851 Suicidal ideations: Secondary | ICD-10-CM | POA: Diagnosis not present

## 2023-11-04 DIAGNOSIS — N3001 Acute cystitis with hematuria: Secondary | ICD-10-CM | POA: Diagnosis not present

## 2023-11-04 DIAGNOSIS — Z86718 Personal history of other venous thrombosis and embolism: Secondary | ICD-10-CM | POA: Diagnosis not present

## 2023-11-04 DIAGNOSIS — Z79899 Other long term (current) drug therapy: Secondary | ICD-10-CM | POA: Diagnosis not present

## 2023-11-04 DIAGNOSIS — Z9981 Dependence on supplemental oxygen: Secondary | ICD-10-CM | POA: Diagnosis not present

## 2023-11-04 LAB — CBG MONITORING, ED
Glucose-Capillary: 167 mg/dL — ABNORMAL HIGH (ref 70–99)
Glucose-Capillary: 150 mg/dL — ABNORMAL HIGH (ref 70–99)
Glucose-Capillary: 236 mg/dL — ABNORMAL HIGH (ref 70–99)
Glucose-Capillary: 131 mg/dL — ABNORMAL HIGH (ref 70–99)

## 2023-11-04 NOTE — Progress Notes (Signed)
 Inpatient Psychiatric Referral  Patient was recommended inpatient per Elveria Batter, NP. There are no available beds at [facility], per [facility] Luke Sprang, RN. Patient was referred to the following out of network facilities:  Destination  Service Provider Request Status Address Phone Fax  [facility]  Pending - Request Sent [address] [place] [address] [phone]  [facility]  Pending - Request Sent [address] [place] [address] [phone]  [facility] Patient Placement  Pending - Request [facility] [place] [phone]  [facility]  Pending - Request Sent [address] [place] [address] [phone]  [facility]  Pending - Request Sent [address] [place] [address] [phone]  [facility]  Pending - Request Sent [address] [place] [address] [phone]  [facility]  Pending - Request Sent [address] [place] [address] [phone]  [facility]  Pending - Request Sent [address] [place] [address] [phone]  [facility]  Pending - Request Sent [address] [place] [address] [phone]  [facility]  Pending - Request Sent [address] [place] [phone]  [facility]  Pending - Request Sent [address] [place] [address] [phone]  [facility]  Pending - Request [facility] [address] [place] [address] [phone]  [facility]  Pending - Request Sent [address] [place], [place] [address] [phone]  [facility]  Pending - Request Sent [address], [place] [address] [phone]  [facility]  Pending - Request Sent [place] [phone]  [facility]  Pending - Request Sent [name] [place] [address] [phone]  [facility]  Pending - Request Sent [address] [place], [place] [address] [phone]  [facility]  Pending - Request Sent [address] [place] [address] [phone]     Situation ongoing, CSW to continue following and update chart as more information becomes available.  Harrie Sofia MSW, LCSWA 11/04/2023  7:51PM

## 2023-11-04 NOTE — ED Provider Notes (Signed)
 Emergency Medicine Observation Re-evaluation Note  [name] is a 68 y.o. female, seen on rounds today.  Pt initially presented to the ED for complaints of Drug Overdose Currently, the patient is resting.  Physical Exam  BP (!) 125/44 (BP Location: Right Arm)   Pulse (!) 50   Temp 97.9 F (36.6 C)   Resp 16   Ht 5' 1 (1.549 m)   Wt 123.4 kg   SpO2 (!) 89%   BMI 51.40 kg/m  Physical Exam General: nad Lungs: no hypoxia Psych: calm  ED Course / MDM  EKG:EKG Interpretation Date/Time:  Monday November 02 2023 12:36:01 EDT Ventricular Rate:  50 PR Interval:    QRS Duration:  183 QT Interval:  518 QTC Calculation: 473 R Axis:   -82  Text Interpretation: Ventricular-paced rhythm Premature ventricular complexes Confirmed by Griselda Norris [phone]) on 11/03/2023 5:28:00 PM  I have reviewed the labs performed to date as well as medications administered while in observation.  Recent changes in the last 24 hours include recommend for IP by [facility] NP.  Plan  Current plan is for placement.    Elnor Jayson LABOR, DO 11/04/23 [phone]
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
## 2023-11-04 NOTE — Progress Notes (Signed)
 LCSW Progress Note  [id number]   [name]  11/04/2023  1:07 PM  Description:   Inpatient Psychiatric Referral  Patient was recommended inpatient per Elveria Batter (NP). There are no available beds at [facility], per [facility] Cherylynn Ernst RN. Patient was referred to the following out of network facilities:   [facility] Provider Address Phone Fax  [facility]  [address] [place] [address] [phone]  [facility]  [address], [place] [address] [phone]  [facility] Patient Placement  [facility], [place] [phone]  [facility]  [address] [place] [address] [phone]  [facility]  [address] [place], [place] [address] [phone]  [facility]  [address] [place], [place] [address] [phone]  [facility]  [address] [place] [address] [phone]  [facility]  [address], [place] [address] [phone]  [facility]  [address] [place] [address] [phone]  [facility]  [address] [place], [place] [phone]  [facility]  [address] [place] [address] [phone]  [facility]  [address] [place] [address] [phone]  [facility]  [address] [place], [place] [address] [phone]  [facility]  [address] [place] [address] [phone]      Situation ongoing, CSW to continue following and update chart as more information becomes available.       [place] Kaitlynd Phillips, MSW, LCSW  11/04/2023 1:07 PM

## 2023-11-04 NOTE — ED Notes (Signed)
VOL at this time

## 2023-11-05 ENCOUNTER — Encounter: Payer: Self-pay | Admitting: Cardiovascular Disease

## 2023-11-05 ENCOUNTER — Other Ambulatory Visit: Payer: Self-pay

## 2023-11-05 ENCOUNTER — Encounter: Payer: Self-pay | Admitting: Psychiatry

## 2023-11-05 ENCOUNTER — Other Ambulatory Visit: Payer: Self-pay | Admitting: Vascular Surgery

## 2023-11-05 ENCOUNTER — Inpatient Hospital Stay
Admission: AD | Admit: 2023-11-05 | Discharge: 2023-11-11 | DRG: 885 | Disposition: A | Discharge status: Other Acute Inpatient Hospital | Source: Intra-hospital | Attending: Psychiatry | Admitting: Psychiatry

## 2023-11-05 DIAGNOSIS — G4733 Obstructive sleep apnea (adult) (pediatric): Secondary | ICD-10-CM | POA: Diagnosis present

## 2023-11-05 DIAGNOSIS — M19032 Primary osteoarthritis, left wrist: Secondary | ICD-10-CM | POA: Diagnosis present

## 2023-11-05 DIAGNOSIS — E039 Hypothyroidism, unspecified: Secondary | ICD-10-CM | POA: Diagnosis present

## 2023-11-05 DIAGNOSIS — F332 Major depressive disorder, recurrent severe without psychotic features: Secondary | ICD-10-CM | POA: Diagnosis present

## 2023-11-05 DIAGNOSIS — R112 Nausea with vomiting, unspecified: Secondary | ICD-10-CM | POA: Diagnosis not present

## 2023-11-05 DIAGNOSIS — Z79890 Hormone replacement therapy (postmenopausal): Secondary | ICD-10-CM

## 2023-11-05 DIAGNOSIS — I872 Venous insufficiency (chronic) (peripheral): Secondary | ICD-10-CM | POA: Diagnosis present

## 2023-11-05 DIAGNOSIS — I5032 Chronic diastolic (congestive) heart failure: Secondary | ICD-10-CM | POA: Diagnosis present

## 2023-11-05 DIAGNOSIS — L97901 Non-pressure chronic ulcer of unspecified part of unspecified lower leg limited to breakdown of skin: Secondary | ICD-10-CM

## 2023-11-05 DIAGNOSIS — L97928 Non-pressure chronic ulcer of unspecified part of left lower leg with other specified severity: Secondary | ICD-10-CM | POA: Diagnosis not present

## 2023-11-05 DIAGNOSIS — L97229 Non-pressure chronic ulcer of left calf with unspecified severity: Secondary | ICD-10-CM | POA: Diagnosis present

## 2023-11-05 DIAGNOSIS — I5033 Acute on chronic diastolic (congestive) heart failure: Secondary | ICD-10-CM | POA: Diagnosis not present

## 2023-11-05 DIAGNOSIS — Z86711 Personal history of pulmonary embolism: Secondary | ICD-10-CM

## 2023-11-05 DIAGNOSIS — I272 Pulmonary hypertension, unspecified: Secondary | ICD-10-CM | POA: Diagnosis present

## 2023-11-05 DIAGNOSIS — Z515 Encounter for palliative care: Secondary | ICD-10-CM | POA: Diagnosis not present

## 2023-11-05 DIAGNOSIS — I130 Hypertensive heart and chronic kidney disease with heart failure and stage 1 through stage 4 chronic kidney disease, or unspecified chronic kidney disease: Secondary | ICD-10-CM | POA: Diagnosis present

## 2023-11-05 DIAGNOSIS — M170 Bilateral primary osteoarthritis of knee: Secondary | ICD-10-CM | POA: Diagnosis present

## 2023-11-05 DIAGNOSIS — F321 Major depressive disorder, single episode, moderate: Secondary | ICD-10-CM | POA: Diagnosis not present

## 2023-11-05 DIAGNOSIS — Z87891 Personal history of nicotine dependence: Secondary | ICD-10-CM | POA: Diagnosis not present

## 2023-11-05 DIAGNOSIS — Z950 Presence of cardiac pacemaker: Secondary | ICD-10-CM

## 2023-11-05 DIAGNOSIS — E7800 Pure hypercholesterolemia, unspecified: Secondary | ICD-10-CM | POA: Diagnosis present

## 2023-11-05 DIAGNOSIS — R45851 Suicidal ideations: Secondary | ICD-10-CM | POA: Diagnosis present

## 2023-11-05 DIAGNOSIS — E1122 Type 2 diabetes mellitus with diabetic chronic kidney disease: Secondary | ICD-10-CM | POA: Diagnosis present

## 2023-11-05 DIAGNOSIS — Z7901 Long term (current) use of anticoagulants: Secondary | ICD-10-CM | POA: Diagnosis not present

## 2023-11-05 DIAGNOSIS — M479 Spondylosis, unspecified: Secondary | ICD-10-CM | POA: Diagnosis present

## 2023-11-05 DIAGNOSIS — I83029 Varicose veins of left lower extremity with ulcer of unspecified site: Secondary | ICD-10-CM | POA: Diagnosis present

## 2023-11-05 DIAGNOSIS — N390 Urinary tract infection, site not specified: Secondary | ICD-10-CM | POA: Diagnosis present

## 2023-11-05 DIAGNOSIS — Z9151 Personal history of suicidal behavior: Secondary | ICD-10-CM

## 2023-11-05 DIAGNOSIS — N184 Chronic kidney disease, stage 4 (severe): Secondary | ICD-10-CM | POA: Diagnosis present

## 2023-11-05 DIAGNOSIS — N1832 Chronic kidney disease, stage 3b: Secondary | ICD-10-CM | POA: Diagnosis not present

## 2023-11-05 DIAGNOSIS — Z602 Problems related to living alone: Secondary | ICD-10-CM | POA: Diagnosis present

## 2023-11-05 DIAGNOSIS — Z79899 Other long term (current) drug therapy: Secondary | ICD-10-CM

## 2023-11-05 DIAGNOSIS — Z794 Long term (current) use of insulin: Secondary | ICD-10-CM | POA: Diagnosis not present

## 2023-11-05 DIAGNOSIS — I4821 Permanent atrial fibrillation: Secondary | ICD-10-CM | POA: Diagnosis present

## 2023-11-05 DIAGNOSIS — M19012 Primary osteoarthritis, left shoulder: Secondary | ICD-10-CM | POA: Diagnosis present

## 2023-11-05 DIAGNOSIS — F3342 Major depressive disorder, recurrent, in full remission: Secondary | ICD-10-CM | POA: Diagnosis not present

## 2023-11-05 DIAGNOSIS — T402X2D Poisoning by other opioids, intentional self-harm, subsequent encounter: Secondary | ICD-10-CM | POA: Diagnosis not present

## 2023-11-05 DIAGNOSIS — Z86718 Personal history of other venous thrombosis and embolism: Secondary | ICD-10-CM

## 2023-11-05 DIAGNOSIS — Z6841 Body Mass Index (BMI) 40.0 and over, adult: Secondary | ICD-10-CM | POA: Diagnosis not present

## 2023-11-05 DIAGNOSIS — Z66 Do not resuscitate: Secondary | ICD-10-CM | POA: Diagnosis present

## 2023-11-05 DIAGNOSIS — Z885 Allergy status to narcotic agent status: Secondary | ICD-10-CM

## 2023-11-05 DIAGNOSIS — F419 Anxiety disorder, unspecified: Secondary | ICD-10-CM | POA: Diagnosis present

## 2023-11-05 DIAGNOSIS — Z9842 Cataract extraction status, left eye: Secondary | ICD-10-CM

## 2023-11-05 DIAGNOSIS — Z961 Presence of intraocular lens: Secondary | ICD-10-CM | POA: Diagnosis present

## 2023-11-05 DIAGNOSIS — M79606 Pain in leg, unspecified: Secondary | ICD-10-CM

## 2023-11-05 DIAGNOSIS — Z9049 Acquired absence of other specified parts of digestive tract: Secondary | ICD-10-CM

## 2023-11-05 DIAGNOSIS — L02411 Cutaneous abscess of right axilla: Secondary | ICD-10-CM | POA: Diagnosis not present

## 2023-11-05 DIAGNOSIS — Z7401 Bed confinement status: Secondary | ICD-10-CM

## 2023-11-05 DIAGNOSIS — N3001 Acute cystitis with hematuria: Secondary | ICD-10-CM | POA: Diagnosis not present

## 2023-11-05 DIAGNOSIS — M1611 Unilateral primary osteoarthritis, right hip: Secondary | ICD-10-CM | POA: Diagnosis present

## 2023-11-05 DIAGNOSIS — Z9841 Cataract extraction status, right eye: Secondary | ICD-10-CM

## 2023-11-05 DIAGNOSIS — Z8249 Family history of ischemic heart disease and other diseases of the circulatory system: Secondary | ICD-10-CM

## 2023-11-05 LAB — RESP PANEL BY RT-PCR (RSV, FLU A&B, COVID)  RVPGX2
Influenza A by PCR: NEGATIVE
Influenza B by PCR: NEGATIVE
Resp Syncytial Virus by PCR: NEGATIVE
SARS Coronavirus 2 by RT PCR: NEGATIVE

## 2023-11-05 LAB — GLUCOSE, CAPILLARY: Glucose-Capillary: 170 mg/dL — ABNORMAL HIGH (ref 70–99)

## 2023-11-05 LAB — URINE CULTURE: Culture: >=100000 — AB

## 2023-11-05 LAB — CBG MONITORING, ED
Glucose-Capillary: 198 mg/dL — ABNORMAL HIGH (ref 70–99)
Glucose-Capillary: 213 mg/dL — ABNORMAL HIGH (ref 70–99)

## 2023-11-05 MED ORDER — APIXABAN 5 MG PO TABS
5.0000 mg | ORAL_TABLET | Freq: Two times a day (BID) | ORAL | Status: DC
  Administered 2023-11-05 – 2023-11-10 (×10): 5 mg via ORAL
  Filled 2023-11-05 (×12): qty 1

## 2023-11-05 MED ORDER — CITALOPRAM HYDROBROMIDE 20 MG PO TABS
40.0000 mg | ORAL_TABLET | Freq: Every day | ORAL | Status: DC
  Administered 2023-11-05 – 2023-11-10 (×6): 40 mg via ORAL
  Filled 2023-11-05 (×6): qty 2

## 2023-11-05 MED ORDER — ALLOPURINOL 100 MG PO TABS
50.0000 mg | ORAL_TABLET | ORAL | Status: DC
  Administered 2023-11-07: 50 mg via ORAL
  Filled 2023-11-05 (×4): qty 1

## 2023-11-05 MED ORDER — ACETAMINOPHEN 325 MG PO TABS
650.0000 mg | ORAL_TABLET | Freq: Four times a day (QID) | ORAL | Status: DC | PRN
  Administered 2023-11-05: 650 mg via ORAL
  Filled 2023-11-05: qty 2

## 2023-11-05 MED ORDER — GUAIFENESIN-DM 100-10 MG/5ML PO SYRP: 5.0000 mL | ORAL_SOLUTION | Freq: Three times a day (TID) | ORAL | Status: DC | PRN

## 2023-11-05 MED ORDER — TORSEMIDE 20 MG PO TABS
40.0000 mg | ORAL_TABLET | Freq: Two times a day (BID) | ORAL | Status: DC
  Administered 2023-11-06 – 2023-11-10 (×9): 40 mg via ORAL
  Filled 2023-11-05 (×13): qty 2

## 2023-11-05 MED ORDER — OLANZAPINE 5 MG PO TBDP: 5.0000 mg | ORAL_TABLET | Freq: Three times a day (TID) | ORAL | Status: DC | PRN

## 2023-11-05 MED ORDER — ADULT MULTIVITAMIN W/MINERALS CH
1.0000 | ORAL_TABLET | Freq: Every day | ORAL | Status: DC
  Administered 2023-11-06 – 2023-11-10 (×4): 1 via ORAL
  Filled 2023-11-05 (×6): qty 1

## 2023-11-05 MED ORDER — INSULIN ASPART 100 UNIT/ML IJ SOLN
0.0000 [IU] | Freq: Three times a day (TID) | INTRAMUSCULAR | Status: DC
  Administered 2023-11-06 (×2): 3 [IU] via SUBCUTANEOUS
  Administered 2023-11-07: 2 [IU] via SUBCUTANEOUS
  Administered 2023-11-07 (×2): 3 [IU] via SUBCUTANEOUS
  Administered 2023-11-08: 2 [IU] via SUBCUTANEOUS
  Administered 2023-11-08 (×2): 3 [IU] via SUBCUTANEOUS
  Administered 2023-11-09 (×3): 2 [IU] via SUBCUTANEOUS
  Administered 2023-11-10 (×2): 3 [IU] via SUBCUTANEOUS
  Administered 2023-11-10: 2 [IU] via SUBCUTANEOUS
  Filled 2023-11-05 (×6): qty 1

## 2023-11-05 MED ORDER — FERROUS SULFATE 325 (65 FE) MG PO TABS
325.0000 mg | ORAL_TABLET | Freq: Every day | ORAL | Status: DC
  Administered 2023-11-06 – 2023-11-10 (×4): 325 mg via ORAL
  Filled 2023-11-05 (×5): qty 1

## 2023-11-05 MED ORDER — LEVOTHYROXINE SODIUM 75 MCG PO TABS
75.0000 ug | ORAL_TABLET | Freq: Every day | ORAL | Status: DC
  Administered 2023-11-06 – 2023-11-11 (×5): 75 ug via ORAL
  Filled 2023-11-05 (×6): qty 1

## 2023-11-05 MED ORDER — CEPHALEXIN 500 MG PO CAPS
500.0000 mg | ORAL_CAPSULE | Freq: Two times a day (BID) | ORAL | Status: AC
  Administered 2023-11-05 – 2023-11-07 (×5): 500 mg via ORAL
  Filled 2023-11-05 (×6): qty 1

## 2023-11-05 MED ORDER — OLANZAPINE 10 MG IM SOLR: 5.0000 mg | Freq: Three times a day (TID) | INTRAMUSCULAR | Status: DC | PRN

## 2023-11-05 MED ORDER — NYSTATIN 100000 UNIT/GM EX POWD
Freq: Three times a day (TID) | CUTANEOUS | Status: DC
  Administered 2023-11-05: 1 via TOPICAL
  Filled 2023-11-05 (×2): qty 15

## 2023-11-05 MED ORDER — INSULIN ASPART 100 UNIT/ML IJ SOLN: 0.0000 [IU] | Freq: Every day | INTRAMUSCULAR | Status: DC

## 2023-11-05 MED ORDER — MELATONIN 5 MG PO TABS
10.0000 mg | ORAL_TABLET | Freq: Every day | ORAL | Status: DC
  Administered 2023-11-05 – 2023-11-10 (×6): 10 mg via ORAL
  Filled 2023-11-05 (×7): qty 2

## 2023-11-05 MED ORDER — OXYCODONE HCL 5 MG PO TABS: 5.0000 mg | ORAL_TABLET | Freq: Four times a day (QID) | ORAL | Status: DC | PRN

## 2023-11-05 MED ORDER — AMLODIPINE BESYLATE 5 MG PO TABS
10.0000 mg | ORAL_TABLET | Freq: Every day | ORAL | Status: DC
  Administered 2023-11-08 – 2023-11-10 (×2): 10 mg via ORAL
  Filled 2023-11-05 (×6): qty 2

## 2023-11-05 MED ORDER — DIAZEPAM 5 MG PO TABS: 5.0000 mg | ORAL_TABLET | Freq: Two times a day (BID) | ORAL | Status: DC | PRN

## 2023-11-05 MED ORDER — ROSUVASTATIN CALCIUM 10 MG PO TABS
10.0000 mg | ORAL_TABLET | Freq: Every day | ORAL | Status: DC
  Administered 2023-11-06 – 2023-11-10 (×4): 10 mg via ORAL
  Filled 2023-11-05 (×6): qty 1

## 2023-11-05 MED ORDER — MAGNESIUM HYDROXIDE 400 MG/5ML PO SUSP: 30.0000 mL | Freq: Every day | ORAL | Status: DC | PRN

## 2023-11-05 MED ORDER — ALUM & MAG HYDROXIDE-SIMETH 200-200-20 MG/5ML PO SUSP
30.0000 mL | ORAL | Status: DC | PRN
  Administered 2023-11-11: 30 mL via ORAL
  Filled 2023-11-05: qty 30

## 2023-11-05 NOTE — ED Notes (Signed)
 Attempted report to geri psych. Stated they will call me back.

## 2023-11-05 NOTE — Group Note (Signed)
 Recreation Therapy Group Note   Group Topic:Coping Skills  Group Date: 11/05/2023 Start Time: 1415 End Time: 1505 Facilitators: Celestia Jeoffrey BRAVO, LRT, CTRS Location: Dayroom  Group Description: Mind Map.  Patient was provided a blank template of a diagram with 32 blank boxes in a tiered system, branching from the center (similar to a bubble chart). LRT directed patients to label the middle of the diagram Coping Skills. LRT and patients then came up with 8 different coping skills as examples. Pt were directed to record their coping skills in the 2nd tier boxes closest to the center.  Patients would then share their coping skills with the group as LRT wrote them out. LRT gave a handout of 99 different coping skills at the end of group.   Goal Area(s) Addressed: Patients will be able to define "coping skills". Patient will identify new coping skills.  Patient will increase communication.  Affect/Mood: N/A   Participation Level: Did not attend    Clinical Observations/Individualized Feedback: Patient did not attend group.   Plan: Continue to engage patient in RT group sessions 2-3x/week.   Jeoffrey BRAVO Celestia, LRT, CTRS 11/05/2023 5:11 PM

## 2023-11-05 NOTE — ED Notes (Signed)
 PTAR has been scheduled for the patient to be transported to [facility] geri psych.  The sheriff's department has also been contacted to add as a rider for support during transport to [facility].  No ETA could be provided.  RN has been made aware of arrangements.

## 2023-11-05 NOTE — Progress Notes (Signed)
 Pt was accepted to [facility] 11/05/2023 Bed Assignment 33  Address: [address] [place], [place], [place] [address]  [facility] Fax: [phone]  Pt meets inpatient criteria per: Elveria Batter NP  Attending Physician will be Dr. Allyn Donnelly COME  Report can be called to: [phone]  Pt can arrive after: [facility] WILL UPDATE  Care Team notified: Geni Silvan RN, Tennie Hopping RN, Devere Mulch RN, Jerel Gravely NP,     [place] Jomo Forand MSW, LCSWA 11/05/2023 11:22 AM

## 2023-11-05 NOTE — Tx Team (Signed)
 Initial Treatment Plan 11/05/2023 5:08 PM NINEL ABDELLA FMW:[id number]    PATIENT STRESSORS: Health problems     PATIENT STRENGTHS: Communication skills    PATIENT IDENTIFIED PROBLEMS:                      DISCHARGE CRITERIA:  Ability to meet basic life and health needs Adequate post-discharge living arrangements Improved stabilization in mood, thinking, and/or behavior Motivation to continue treatment in a less acute level of care Safe-care adequate arrangements made  PRELIMINARY DISCHARGE PLAN: Return to previous living arrangement  PATIENT/FAMILY INVOLVEMENT: This treatment plan has been presented to and reviewed with the patient, KRISANNE LICH. The patient has been given the opportunity to ask questions and make suggestions.  [place] littie Gains, RN 11/05/2023, 5:08 PM

## 2023-11-05 NOTE — ED Notes (Addendum)
 The patient has been transferred to [facility] via PTAR. All necessary documentation was sent with the patient to the best of my abilities. EMTALA was filled out. Patient with VSS and NAD. All belongings with patient. Patient confirmed she has everything she came with.

## 2023-11-05 NOTE — ED Provider Notes (Addendum)
 Emergency Medicine Observation Re-evaluation Note  [name] is a 68 y.o. female, seen on rounds today.  Pt initially presented to the ED for complaints of Drug Overdose Currently, the patient is resting in her room after eating breakfast.  Physical Exam  BP (!) 133/42   Pulse (!) 49   Temp 97.9 F (36.6 C)   Resp 18   Ht 5' 1 (1.549 m)   Wt 123.4 kg   SpO2 100%   BMI 51.40 kg/m  Physical Exam General: Laying without acute distress Cardiac: No murmur on my exam Lungs: Lungs clear bilaterally Psych: No agitation at this time  ED Course / MDM  EKG:EKG Interpretation Date/Time:  Monday November 02 2023 12:36:01 EDT Ventricular Rate:  50 PR Interval:    QRS Duration:  183 QT Interval:  518 QTC Calculation: 473 R Axis:   -82  Text Interpretation: Ventricular-paced rhythm Premature ventricular complexes Confirmed by Griselda Norris [phone]) on 11/03/2023 5:28:00 PM  I have reviewed the labs performed to date as well as medications administered while in observation.  Recent changes in the last 24 hours include none reported by overnight nursing.  Plan  Current plan is for awaiting placement.    Kasiah Manka, Lonni PARAS, MD 11/05/23 9177  11:50 AM Spoke to the psychiatry team who feels she is likely a geropsych admission.  They requested we make sure she has not any adjustment in her medications for the elevated TSH that was found to her initial visit.  Will speak to medicine about this.  I spoke to medicine and said as her T4 was normal, this TSH change can be addressed as an outpatient with adjusting her medications.  Plan for admission to geropsych per psychiatry.    Anora Schwenke, Lonni PARAS, MD 11/05/23 1248    Deletha Jaffee, Lonni PARAS, MD 11/05/23 [phone]

## 2023-11-05 NOTE — Plan of Care (Signed)
  Problem: Education: Goal: Ability to make informed decisions regarding treatment will improve Outcome: Not Progressing   Problem: Coping: Goal: Coping ability will improve Outcome: Not Progressing   Problem: Health Behavior/Discharge Planning: Goal: Identification of resources available to assist in meeting health care needs will improve Outcome: Not Progressing   Problem: Medication: Goal: Compliance with prescribed medication regimen will improve Outcome: Not Progressing   Problem: Self-Concept: Goal: Ability to disclose and discuss suicidal ideas will improve Outcome: Not Progressing Goal: Will verbalize positive feelings about self Outcome: Not Progressing Note: Patient is unable to be assessed as therapy was recently initiated. Patient will be monitored by provider to determine if a change in treatment plan is warranted

## 2023-11-06 ENCOUNTER — Telehealth (HOSPITAL_BASED_OUTPATIENT_CLINIC_OR_DEPARTMENT_OTHER): Payer: Self-pay | Admitting: *Deleted

## 2023-11-06 DIAGNOSIS — F332 Major depressive disorder, recurrent severe without psychotic features: Secondary | ICD-10-CM | POA: Diagnosis not present

## 2023-11-06 LAB — GLUCOSE, CAPILLARY
Glucose-Capillary: 152 mg/dL — ABNORMAL HIGH (ref 70–99)
Glucose-Capillary: 167 mg/dL — ABNORMAL HIGH (ref 70–99)
Glucose-Capillary: 158 mg/dL — ABNORMAL HIGH (ref 70–99)
Glucose-Capillary: 162 mg/dL — ABNORMAL HIGH (ref 70–99)

## 2023-11-06 NOTE — Group Note (Signed)
 Physical/Occupational Therapy Group Note  Group Topic: Neurographic Art  Group Date: 11/06/2023 Start Time: 1315 End Time: 1355 Facilitators: Clive Warren CROME, OT   Group Description: Group participated with Neurographic art activity, using watercolor paints to facilitate creative expression and meditation/relaxation for each individual.  Incorporated bimanual coordination, mental focus, emotional processing, task/command following and relaxation techniques as appropriate.  Patients engaged socially with therapist and other group participants throughout session. Allowed to ask questions as appropriate, and encouraged to identify ways they could use/share their creations with themselves and others.  Therapeutic Goal(s):  Demonstrate ability to independently manipulate utensils required to participate with and complete activity. Demonstrate ability to cognitively focus on task and follow commands necessary for completion. Demonstrate use of art as an outlet for emotional processing and expression. Identify and demonstrate importance of relaxation, neural calming and meditation for improved participation with life groups.  Individual Participation: Did not attend.   Participation Level: Did not attend   Participation Quality:   Behavior:   Speech/Thought Process:   Affect/Mood:   Insight:   Judgement:   Modes of Intervention:   Patient Response to Interventions:    Plan: Continue to engage patient in PT/OT groups 1 - 2x/week.  Rashmi Tallent R., MPH, MS, OTR/L ascom [phone] 11/06/23, 2:29 PM

## 2023-11-06 NOTE — Evaluation (Signed)
 Occupational Therapy Evaluation Patient Details Name: [name] MRN: [id number] DOB: Nov 09, 1955 Today's Date: 11/06/2023   History of Present Illness   [name] is a 68 y.o. female admitted: Presented to the EDfor 11/02/2023 12:21 PM for intentional ingestion of oxycodone  pills in an attempt to harm self. She carries the psychiatric diagnoses of MDD and anxiety and has a past medical history of  hypertension, type 2 diabetes, atrial fibrillation, DVT/PE status post IVC filter, HFpEF, single-chamber PPM and chronic pain     Clinical Impressions Patient presenting with decreased Ind in self care,balance, functional mobility/transfers, endurance, and safety awareness. Patient reports she was living at home alone and recently went to SNF for rehab with plans for transition to LTC. While there family packed up her home. However, some complications occurred and pt returned home but was unable to care for herself as well as home remaining packed around her. Pt felt overwelmed that she had no assistance and was a burden on others. She is currently on 2Ls O2 via [place].  Pt refuses OOB activity this session but does demonstrate B LE leg lifts, glute squeezes, and ankle pumps. Pt agrees that she can't return home as her health continues to decline and she has no assistance.  Patient will benefit from acute OT to increase overall independence in the areas of ADLs, functional mobility, and safety awareness in order to safely discharge.       Equipment Recommendations   None recommended by OT      Precautions/Restrictions   Precautions Precautions: Fall     Mobility Bed Mobility Overal bed mobility: Needs Assistance Bed Mobility: Rolling Rolling: Mod assist, Max assist              Transfers                              ADL either performed or assessed with clinical judgement   ADL Overall ADL's : Needs assistance/impaired                                        General ADL Comments: ADLs deferred this session as pt declines during session     Vision Patient Visual Report: No change from baseline              Pertinent Vitals/Pain Pain Assessment Pain Assessment: Faces Faces Pain Scale: Hurts little more Pain Location: B LEs Pain Descriptors / Indicators: Discomfort, Sore Pain Intervention(s): Monitored during session     Extremity/Trunk Assessment Upper Extremity Assessment Upper Extremity Assessment: Generalized weakness   Lower Extremity Assessment Lower Extremity Assessment: Generalized weakness       Communication Communication Communication: No apparent difficulties   Cognition Arousal: Alert Behavior During Therapy: WFL for tasks assessed/performed Cognition: No apparent impairments                               Following commands: Intact       Cueing  General Comments   Cueing Techniques: Verbal cues              Home Living Family/patient expects to be discharged to:: Other (Comment) (LTC) Living Arrangements: Alone  Additional Comments: Pt living at home alone with use of RW for mobility and recently placed on 2Ls of O2. Pt endorses no support/assist at home.          OT Problem List: Decreased strength;Impaired balance (sitting and/or standing);Decreased safety awareness;Cardiopulmonary status limiting activity;Decreased activity tolerance   OT Treatment/Interventions: Self-care/ADL training;Therapeutic exercise;Patient/family education;Balance training;Energy conservation;Therapeutic activities      OT Goals(Current goals can be found in the care plan section)   Acute Rehab OT Goals Patient Stated Goal: to go to LTC OT Goal Formulation: With patient Time For Goal Achievement: 12/04/23 Potential to Achieve Goals: Fair ADL Goals Pt Will Perform Grooming: with supervision;standing Pt Will Transfer to Toilet: with  supervision;ambulating Pt Will Perform Toileting - Clothing Manipulation and hygiene: with supervision;sit to/from stand Pt/caregiver will Perform Home Exercise Program: Increased strength;Both right and left upper extremity;With theraband;With written HEP provided   OT Frequency:  Min 1X/week       AM-PAC OT 6 Clicks Daily Activity     Outcome Measure Help from another person eating meals?: None Help from another person taking care of personal grooming?: None Help from another person toileting, which includes using toliet, bedpan, or urinal?: A Little Help from another person bathing (including washing, rinsing, drying)?: A Little Help from another person to put on and taking off regular upper body clothing?: A Little Help from another person to put on and taking off regular lower body clothing?: A Little 6 Click Score: 20   End of Session Nurse Communication: Mobility status  Activity Tolerance: Patient tolerated treatment well Patient left: in bed;with call bell/phone within reach  OT Visit Diagnosis: Unsteadiness on feet (R26.81);Muscle weakness (generalized) (M62.81)                Time: 8557-8492 OT Time Calculation (min): 25 min Charges:  OT General Charges $OT Visit: 1 Visit OT Evaluation $OT Eval Low Complexity: 1 Low OT Treatments $Therapeutic Activity: 8-22 mins  Izetta Claude, MS, OTR/L , CBIS ascom [phone]  11/06/23, 4:03 PM

## 2023-11-06 NOTE — BHH Counselor (Signed)
 CSW attempted to contact pt's sister Niels Barge, [phone],  per request of [place], CSW who received a call from pt's sister on the BMU side.   Pt's sister reports she is the HCPOA.   CSW checked pt's chart, unable to find HCPOA documentation.   CSW contacted pt's sister twice, unable to reach unable to LVM  Lum Croft, MSW, [facility] 11/06/2023 12:03 PM

## 2023-11-06 NOTE — BHH Suicide Risk Assessment (Signed)
 [name]   Nursing information obtained from:  Patient Demographic factors:  Caucasian, Age 68 or older, Living alone Current Mental Status:  Suicidal ideation indicated by patient Loss Factors:  NA Historical Factors:  NA Risk Reduction Factors:  NA  Total Time spent with patient: 30 minutes Principal Problem: MDD (major depressive disorder), recurrent severe, without psychosis (HCC) Diagnosis:  Principal Problem:   MDD (major depressive disorder), recurrent severe, without psychosis (HCC)  Subjective Data: [name] is a 68 y.o. female admitted: Presented to the EDfor 11/02/2023 12:21 PM for intentional ingestion of oxycodone  pills in an attempt to harm self. She carries the psychiatric diagnoses of MDD and anxiety and has a past medical history of  hypertension, type 2 diabetes, atrial fibrillation, DVT/PE status post IVC filter, HFpEF, single-chamber PPM and chronic pain . Her current presentation of suicidal ideations with attempt via overdose due to feeling hopeless is most consistent with MDD. She meets criteria for inpatient psychiatric admission based on current symptoms Patient is admitted to [facility] unit with Q15 min safety monitoring. Multidisciplinary team approach is offered. Medication management; group/milieu therapy is offered.   Continued Clinical Symptoms:  Alcohol Use Disorder Identification Test Final Score (AUDIT): 0 The Alcohol Use Disorders Identification Test, Guidelines for Use in Primary Care, Second Edition.  World Science writer [facility]). Score between 0-7:  no or low risk or alcohol related problems. Score between 8-15:  moderate risk of alcohol related problems. Score between 16-19:  high risk of alcohol related problems. Score 20 or above:  warrants further diagnostic evaluation for alcohol dependence and treatment.   CLINICAL FACTORS:   Depression:   Hopelessness   Musculoskeletal: Strength & Muscle Tone:  decreased Gait & Station: unsteady Patient leans: N/A  Psychiatric Specialty Exam:  Presentation  General Appearance:  Appropriate for Environment; Casual  Eye Contact: Minimal  Speech: Clear and Coherent  Speech Volume: Normal  Handedness:No data recorded  Mood and Affect  Mood: Depressed; Anxious; Dysphoric  Affect: Depressed; Flat   Thought Process  Thought Processes: Coherent  Descriptions of Associations:Intact  Orientation:Full (Time, Place and Person)  Thought Content:Illogical  History of Schizophrenia/Schizoaffective disorder:No  Duration of Psychotic Symptoms:No data recorded Hallucinations:Hallucinations: None  Ideas of Reference:None  Suicidal Thoughts:Suicidal Thoughts: Yes, Active SI Active Intent and/or Plan: Without Plan; With Intent  Homicidal Thoughts:Homicidal Thoughts: No   Sensorium  Memory: Immediate Fair; Recent Fair; Remote Fair  Judgment: Impaired  Insight: Shallow   Executive Functions  Concentration: Fair  Attention Span: Fair  Recall: Fair  Fund of Knowledge: Fair  Language: Fair   Psychomotor Activity  Psychomotor Activity: Psychomotor Activity: Normal   Assets  Assets: Desire for Improvement; Resilience   Sleep  Sleep: Sleep: Fair    Physical Exam: Physical Exam ROS Blood pressure (!) 148/59, pulse (!) 50, temperature 97.9 F (36.6 C), resp. rate 18, height 5' 1 (1.549 m), weight 136.1 kg, SpO2 99%. Body mass index is 56.68 kg/m.   COGNITIVE FEATURES THAT CONTRIBUTE TO RISK:  None    SUICIDE RISK:   Minimal: No identifiable suicidal ideation.  Patients presenting with no risk factors but with morbid ruminations; may be classified as minimal risk based on the severity of the depressive symptoms  PLAN OF CARE: Patient is admitted to [facility] psych unit with Q15 min safety monitoring. Multidisciplinary team approach is offered. Medication management; group/milieu therapy is offered.    I certify that inpatient services furnished can reasonably be expected to improve  the patient's condition.   Allyn Foil, MD 11/06/2023, 12:27 PM

## 2023-11-06 NOTE — Progress Notes (Signed)
   11/06/23 1300  Psych Admission Type (Psych Patients Only)  Admission Status Voluntary  Psychosocial Assessment  Patient Complaints Depression  Eye Contact Fair  Facial Expression Sad  Affect Sullen  Speech Soft  Interaction Minimal  Motor Activity Slow  Appearance/Hygiene In hospital gown  Behavior Characteristics Appropriate to situation  Mood Depressed  Thought Process  Coherency WDL  Content WDL  Delusions WDL  Perception WDL  Hallucination None reported or observed  Judgment WDL  Confusion WDL  Danger to Self  Current suicidal ideation? Passive  Agreement Not to Harm Self Yes  Description of Agreement verbal  Danger to Others  Danger to Others None reported or observed

## 2023-11-06 NOTE — Plan of Care (Signed)
  Problem: Education: Goal: Ability to make informed decisions regarding treatment will improve Outcome: Progressing   Problem: Coping: Goal: Coping ability will improve Outcome: Progressing   Problem: Medication: Goal: Compliance with prescribed medication regimen will improve Outcome: Progressing   

## 2023-11-06 NOTE — Progress Notes (Signed)
   11/06/23 0341  Psych Admission Type (Psych Patients Only)  Admission Status Voluntary  Psychosocial Assessment  Patient Complaints Depression  Eye Contact Fair  Facial Expression Sad  Affect Sullen  Speech Soft  Interaction Minimal  Motor Activity Slow  Appearance/Hygiene Poor hygiene  Behavior Characteristics Cooperative;Appropriate to situation  Mood Depressed  Aggressive Behavior  Effect No apparent injury  Thought Process  Coherency WDL  Content WDL  Delusions WDL  Perception WDL  Hallucination None reported or observed  Judgment WDL  Confusion WDL  Danger to Self  Current suicidal ideation? Passive  Self-Injurious Behavior No self-injurious ideation or behavior indicators observed or expressed   Agreement Not to Harm Self Yes  Description of Agreement verbal  Danger to Others  Danger to Others None reported or observed    Estimated Sleeping Duration (Last 24 Hours): 10.00-11.50 hours  (Sleep Hours) - 10-11.5 hrs (Any PRNs that were needed, meds refused, or side effects to meds)- Pt. Offered PRN tylenol  650mg  for c/o generalized pain, noted to be effective (Any disturbances and when (visitation, over night)- none (Concerns raised by the patient)- none   (SI/HI/AVH)-

## 2023-11-06 NOTE — Telephone Encounter (Signed)
 Post ED Visit - Positive Culture Follow-up  Culture report reviewed by antimicrobial stewardship pharmacist: [facility] [x]  [place], [place].D. []  Venetia Gully, Pharm.D., BCPS AQ-ID []  Garrel Crews, Pharm.D., BCPS []  Almarie Lunger, Pharm.D., BCPS []  [place], [place].D., BCPS, AAHIVP []  Rosaline Bihari, Pharm.D., BCPS, AAHIVP []  Vernell Meier, PharmD, BCPS []  Latanya Hint, PharmD, BCPS []  Donald Medley, PharmD, BCPS []  Rocky Bold, PharmD []  Dorothyann Alert, PharmD, BCPS []  Morene Babe, PharmD  [facility] []  Rosaline Edison, PharmD []  Romona Bliss, PharmD []  Dolphus Roller, PharmD []  Veva Seip, Rph []  Vernell Daunt) Leonce, PharmD []  Eva [name], PharmD []  Rosaline Millet, PharmD []  Iantha Batch, PharmD []  Arvin Gauss, PharmD []  Wanda Hasting, PharmD []  Ronal Rav, PharmD []  Rocky Slade, PharmD []  Bard Jeans, PharmD   Positive urine culture Patient currently admitted at [facility] no further patient follow-up is required at this time.  [name] 11/06/2023, 8:41 AM

## 2023-11-06 NOTE — Group Note (Signed)
 Date:  11/06/2023 Time:  11:33 AM  Group Topic/Focus:  Goals/Karaoke Group:   The focus of this group is to help patients establish daily goals to achieve during treatment and discuss how the patient can incorporate goal setting into their daily lives to aide in recovery. Also pts did sing along to some of their favorite songs while interacting with peers.     Participation Level:  Did Not Attend  Beatris ONEIDA Hasten 11/06/2023, 11:33 AM

## 2023-11-06 NOTE — Group Note (Signed)
 Recreation Therapy Group Note   Group Topic:General Recreation  Group Date: 11/06/2023 Start Time: 1400 End Time: 1425 Facilitators: Celestia Jeoffrey BRAVO, LRT, CTRS Location: Courtyard  Group Description: Outdoor Recreation. Patients had the option to play corn hole, ring toss, bowling or listening to music while outside in the courtyard getting fresh air and sunlight. Patients helped water and prune the raised garden beds. LRT and patients discussed things that they enjoy doing in their free time outside of the hospital. LRT encouraged patients to drink water after being active and getting their heart rate up.    Goal Area(s) Addressed:   Patient will identify leisure interests.    Patient will practice healthy decision making.   Patient will engage in recreation activity.    Affect/Mood: N/A   Participation Level: Did not attend    Clinical Observations/Individualized Feedback: Patient did not attend group.   Plan: Continue to engage patient in RT group sessions 2-3x/week.   Jeoffrey BRAVO Celestia, LRT, CTRS 11/06/2023 3:56 PM

## 2023-11-06 NOTE — Plan of Care (Signed)
                                                     Palliative Care Progress Note   Patient Name: [name]       Date: 11/06/2023 DOB: 01-20-1956  Age: 68 y.o. MRN#: [id number] Attending Physician: Donnelly Mellow, MD Primary Care Physician: Valma Carwin, MD Admit Date: 11/05/2023  PMT was consulted for: Patient has palliative care interventiona nd got discharged home and admitted for suicide attempt. her code status is listed wrong on admission.Please assist.  At request of Dr. Donnelly, extensive chart review completed including labs, vital signs, imaging, progress notes, orders, and available advanced directive documents from current and previous encounters.   As per chart review, patient met with my PMT colleague in June 2025.  Patient had full capacity to make medical decisions independently at that time.  Please see note on 10/05/2023.  As per note, CODE STATUS was shifted to DNR and DNI with a MOST form completed.  Please see documents in ACP tab of epic.  MOST form flex patient's wishes are as follows:  Cardiopulmonary Resuscitation: Do Not Attempt Resuscitation (DNR/No CPR)  Medical Interventions: Limited Additional Interventions: Use medical treatment, IV fluids and cardiac monitoring as indicated, DO NOT USE intubation or mechanical ventilation. May consider use of less invasive airway support such as BiPAP or CPAP. Also provide comfort measures. Transfer to the hospital if indicated. Avoid intensive care.   Antibiotics: Determine use of limitation of antibiotics when infection occurs  IV Fluids: IV fluids for a defined trial period  Feeding Tube: No feeding tube    As per patient's advanced care directive on file, patient named [name] as her healthcare agent on 10/05/2023.  Therefore, Leah should be the point of contact and surrogate  decision maker for [name] at this time.  As noted in patient's chart, patient's sister has called saying that she is patient's HCPOA.  However, paperwork should be provided if an HCPOA has been created since 09/08/2023.  Given that patient has been admitted for suicidal ideation, it is not appropriate to engage patient with goals of care discussions or to make changes to her medical plan without involving her healthcare agent Leah.  Of note, patient's chart reveals she was active with hospice services.  I contacted hospice liaison with [facility] of the [place] and [facility].  They have no record of patient being active with their teams.  If patient's wishes were changed after September 08, 2023, documentation needs to be provided. TOC should be able to assist with finding which agency, if any, patient was active with. DNR is not expired and therefore DNR with limited interventions remains appropriate at this time.   If patient is active with a hospice team, that hospice agency should engage with patient for goals of care discussions when appropriate during this hospitalization.  If patient is no longer active with hospice services, PMT remains available to patient to help clarify goals not outlined above. Please re-engage if PMT can assist further.   Above conveyed to attending.  Thank you for allowing the Palliative Medicine Team to assist in the care of Leah JONETTA Maylene Lamarr L. Arvid, DNP, FNP-BC Palliative Medicine Team  No charge

## 2023-11-06 NOTE — BH IP Treatment Plan (Signed)
 Interdisciplinary Treatment and Diagnostic Plan Update  11/06/2023 Time of Session: 10:34 AM  [name] MRN: [id number]  Principal Diagnosis: MDD (major depressive disorder), recurrent severe, without psychosis (HCC)  Secondary Diagnoses: Principal Problem:   MDD (major depressive disorder), recurrent severe, without psychosis (HCC)   Current Medications:  Current Facility-Administered Medications  Medication Dose Route Frequency Provider Last Rate Last Admin   acetaminophen  (TYLENOL ) tablet 650 mg  650 mg Oral Q6H PRN Mannie Jerel PARAS, NP   650 mg at 11/05/23 2158   [START ON 11/07/2023] allopurinol  (ZYLOPRIM ) tablet 50 mg  50 mg Oral QODAY Stevens, Terry J, NP       alum & mag hydroxide-simeth (MAALOX/MYLANTA) 200-200-20 MG/5ML suspension 30 mL  30 mL Oral Q4H PRN Mannie Jerel PARAS, NP       amLODipine  (NORVASC ) tablet 10 mg  10 mg Oral Daily Mannie Jerel PARAS, NP       apixaban  (ELIQUIS ) tablet 5 mg  5 mg Oral BID Mannie Jerel PARAS, NP   5 mg at 11/06/23 1008   cephALEXin  (KEFLEX ) capsule 500 mg  500 mg Oral Q12H Mannie Jerel PARAS, NP   500 mg at 11/06/23 1008   citalopram  (CELEXA ) tablet 40 mg  40 mg Oral QHS Mannie Jerel PARAS, NP   40 mg at 11/05/23 2157   diazepam  (VALIUM ) tablet 5 mg  5 mg Oral BID PRN Mannie Jerel PARAS, NP       ferrous sulfate  tablet 325 mg  325 mg Oral Daily Mannie Jerel PARAS, NP   325 mg at 11/06/23 1008   guaiFENesin -dextromethorphan  (ROBITUSSIN DM) 100-10 MG/5ML syrup 5 mL  5 mL Oral Q8H PRN Mannie Jerel PARAS, NP       insulin  aspart (novoLOG ) injection 0-15 Units  0-15 Units Subcutaneous TID WC Mannie Jerel PARAS, NP       insulin  aspart (novoLOG ) injection 0-5 Units  0-5 Units Subcutaneous QHS Mannie Jerel PARAS, NP       levothyroxine  (SYNTHROID ) tablet 75 mcg  75 mcg Oral QAC breakfast Mannie Jerel PARAS, NP   75 mcg at 11/06/23 9385   magnesium  hydroxide (MILK OF MAGNESIA) suspension 30 mL  30 mL Oral Daily PRN Mannie Jerel PARAS, NP       melatonin tablet 10 mg  10  mg Oral QHS Mannie Jerel PARAS, NP   10 mg at 11/05/23 2159   multivitamin with minerals tablet 1 tablet  1 tablet Oral Daily Mannie Jerel PARAS, NP   1 tablet at 11/06/23 1008   nystatin  (MYCOSTATIN /NYSTOP ) topical powder   Topical TID Jadapalle, Sree, MD   Given at 11/06/23 1008   OLANZapine  (ZYPREXA ) injection 5 mg  5 mg Intramuscular TID PRN Mannie Jerel PARAS, NP       OLANZapine  zydis (ZYPREXA ) disintegrating tablet 5 mg  5 mg Oral TID PRN Mannie Jerel PARAS, NP       oxyCODONE  (Oxy IR/ROXICODONE ) immediate release tablet 5 mg  5 mg Oral Q6H PRN Mannie Jerel PARAS, NP       rosuvastatin  (CRESTOR ) tablet 10 mg  10 mg Oral Daily Mannie Jerel PARAS, NP   10 mg at 11/06/23 1008   torsemide  (DEMADEX ) tablet 40 mg  40 mg Oral BID Mannie Jerel PARAS, NP   40 mg at 11/06/23 1007   PTA Medications: Medications Prior to Admission  Medication Sig Dispense Refill Last Dose/Taking   allopurinol  (ZYLOPRIM ) 100 MG tablet Take 50 mg by mouth every other day.  amLODipine  (NORVASC ) 5 MG tablet Take 2 tablets (10 mg total) by mouth daily.      apixaban  (ELIQUIS ) 5 MG TABS tablet Take 5 mg by mouth 2 (two) times daily.      Biotin  w/ Vitamins C & E (HAIR SKIN & NAILS GUMMIES PO) Take 1 each by mouth daily.      citalopram  (CELEXA ) 40 MG tablet Take 40 mg by mouth at bedtime.      diazepam  (VALIUM ) 5 MG tablet Take 1 tablet (5 mg total) by mouth 2 (two) times daily as needed for anxiety or muscle spasms. 10 tablet 0    diphenhydramine -acetaminophen  (TYLENOL  PM) 25-500 MG TABS tablet Take 1 tablet by mouth at bedtime.      ferrous sulfate  325 (65 FE) MG EC tablet Take 325 mg by mouth daily.      guaiFENesin -dextromethorphan  (ROBITUSSIN DM) 100-10 MG/5ML syrup Take by mouth in the morning, at noon, and at bedtime.      insulin  aspart (NOVOLOG ) 100 UNIT/ML injection Inject 6 Units into the skin 3 (three) times daily with meals.      insulin  glargine (LANTUS  SOLOSTAR) 100 UNIT/ML Solostar Pen Inject 20 Units into the skin  daily.      levothyroxine  (SYNTHROID ) 75 MCG tablet Take 75 mcg by mouth daily before breakfast. PATIENT ONLY TAKES NAME BRAND      Melatonin 10 MG TABS Take 10 mg by mouth.      Multiple Vitamins-Minerals (CENTRUM SILVER) CHEW Chew 2 each by mouth daily.      oxyCODONE  (OXY IR/ROXICODONE ) 5 MG immediate release tablet Take 5 mg by mouth every 6 (six) hours as needed for severe pain (pain score 7-10).      rosuvastatin  (CRESTOR ) 10 MG tablet Take 1 tablet (10 mg total) by mouth daily. 30 tablet 0    torsemide  (DEMADEX ) 20 MG tablet Take 2 tablets (40mg ) by mouth twice daily, in the morning and at 4:00pm 180 tablet 0     Patient Stressors: Health problems    Patient Strengths: Communication skills   Treatment Modalities: Medication Management, Group therapy, Case management,  1 to 1 session with clinician, Psychoeducation, Recreational therapy.   Physician Treatment Plan for Primary Diagnosis: MDD (major depressive disorder), recurrent severe, without psychosis (HCC) Long Term Goal(s): Improvement in symptoms so as ready for discharge   Short Term Goals: Ability to identify changes in lifestyle to reduce recurrence of condition will improve Ability to disclose and discuss suicidal ideas Ability to demonstrate self-control will improve Ability to identify and develop effective coping behaviors will improve Ability to identify triggers associated with substance abuse/mental health issues will improve Ability to verbalize feelings will improve  Medication Management: Evaluate patient's response, side effects, and tolerance of medication regimen.  Therapeutic Interventions: 1 to 1 sessions, Unit Group sessions and Medication administration.  Evaluation of Outcomes: Not Progressing  Physician Treatment Plan for Secondary Diagnosis: Principal Problem:   MDD (major depressive disorder), recurrent severe, without psychosis (HCC)  Long Term Goal(s): Improvement in symptoms so as ready for  discharge   Short Term Goals: Ability to identify changes in lifestyle to reduce recurrence of condition will improve Ability to disclose and discuss suicidal ideas Ability to demonstrate self-control will improve Ability to identify and develop effective coping behaviors will improve Ability to identify triggers associated with substance abuse/mental health issues will improve Ability to verbalize feelings will improve     Medication Management: Evaluate patient's response, side effects, and tolerance of medication regimen.  Therapeutic Interventions: 1 to 1 sessions, Unit Group sessions and Medication administration.  Evaluation of Outcomes: Not Progressing   RN Treatment Plan for Primary Diagnosis: MDD (major depressive disorder), recurrent severe, without psychosis (HCC) Long Term Goal(s): Knowledge of disease and therapeutic regimen to maintain health will improve  Short Term Goals: Ability to remain free from injury will improve, Ability to verbalize frustration and anger appropriately will improve, Ability to demonstrate self-control, Ability to participate in decision making will improve, Ability to verbalize feelings will improve, Ability to disclose and discuss suicidal ideas, Ability to identify and develop effective coping behaviors will improve, and Compliance with prescribed medications will improve  Medication Management: RN will administer medications as ordered by provider, will assess and evaluate patient's response and provide education to patient for prescribed medication. RN will report any adverse and/or side effects to prescribing provider.  Therapeutic Interventions: 1 on 1 counseling sessions, Psychoeducation, Medication administration, Evaluate responses to treatment, Monitor vital signs and CBGs as ordered, Perform/monitor CIWA, COWS, AIMS and Fall Risk screenings as ordered, Perform wound care treatments as ordered.  Evaluation of Outcomes: Not  Progressing   LCSW Treatment Plan for Primary Diagnosis: MDD (major depressive disorder), recurrent severe, without psychosis (HCC) Long Term Goal(s): Safe transition to appropriate next level of care at discharge, Engage patient in therapeutic group addressing interpersonal concerns.  Short Term Goals: Engage patient in aftercare planning with referrals and resources, Increase social support, Increase ability to appropriately verbalize feelings, Increase emotional regulation, Facilitate acceptance of mental health diagnosis and concerns, Facilitate patient progression through stages of change regarding substance use diagnoses and concerns, Identify triggers associated with mental health/substance abuse issues, and Increase skills for wellness and recovery  Therapeutic Interventions: Assess for all discharge needs, 1 to 1 time with Social worker, Explore available resources and support systems, Assess for adequacy in community support network, Educate family and significant other(s) on suicide prevention, Complete Psychosocial Assessment, Interpersonal group therapy.  Evaluation of Outcomes: Not Progressing   Progress in Treatment: Attending groups: No. Participating in groups: No. Taking medication as prescribed: Yes. Toleration medication: Yes. Family/Significant other contact made: No, will contact:  CSW will contact if given permission  Patient understands diagnosis: Yes. Discussing patient identified problems/goals with staff: No. Medical problems stabilized or resolved: No. Denies suicidal/homicidal ideation: No. Issues/concerns per patient self-inventory: Yes. Other: None  New problem(s) identified: No, Describe:  None identified   New Short Term/Long Term Goal(s):  elimination of symptoms of psychosis, medication management for mood stabilization; elimination of SI thoughts; development of comprehensive mental wellness/sobriety plan.   Patient Goals:   I can't think of anything  right now  Discharge Plan or Barriers:  CSW will assist with appropriate discharge planning   Reason for Continuation of Hospitalization: Depression Medication stabilization Suicidal ideation  Estimated Length of Stay: 1 to 7 days   Last 3 [place] Suicide Severity Risk Score: Flowsheet Row Admission (Current) from 11/05/2023 in [facility] ED from 11/02/2023 in [facility] Department at [facility] ED from 10/04/2023 in [facility] Emergency Department at [facility]  C-SSRS RISK CATEGORY High Risk High Risk No Risk    Last [facility] 2/9 Scores:     No data to display          Scribe for Treatment Team: Lum JONETTA Croft, LCSWA 11/06/2023 10:50 AM

## 2023-11-06 NOTE — Plan of Care (Signed)
  Problem: Medication: Goal: Compliance with prescribed medication regimen will improve Outcome: Progressing   Problem: Self-Concept: Goal: Will verbalize positive feelings about self Outcome: Not Progressing Note:
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
## 2023-11-06 NOTE — H&P (Signed)
 Psychiatric Admission Assessment Adult  Patient Identification: [name] MRN:  [id number] Date of Evaluation:  11/06/2023 Chief Complaint:  MDD (major depressive disorder), recurrent severe, without psychosis (HCC) [F33.2]   History of Present Illness: [name]. Hosang is a 68 y/o female admitted to the [facility] gero-psych unit. She has significant and complex PMH including MDD, anxiety, HTN, DM2, a-fib on Eliquis , DVT/PE with IVF filter, HFpEF with pulmonary HTN, CKD stage IV, chronic pain, and morbid obesity with a BMI of 56 on 2-3L of oxygen at baseline. She is DNR/DNI with limited additional interventions on hospice, also receiving palliative care. She initially presented to the ED on 11/02/23 following an intentional overdose on 40 oxycodone  pills (5 mg). She was found by her home nurse the next morning who called 911.    The patient was assessed this morning and is A&Ox3. Upon talking to the patient, it is clear that she has significant medical comorbidities that have severely limited her quality of life and ability to complete ADL's. Prior to her suicide attempt, she was at [name] for 1.5 months receiving care for heart failure and hypoxia. She recognized her need for long-term care, but this was unable to be obtained due to the financial cost. Upon discharge, the patient was hopeful about going back home. However, when she got there things just fell apart and she realized how limited she was. Her grass had not been taken care of as promised, her house was in disarray with mice, and she was unable to get up her front steps, requiring her to call the fire department for help. She was able to get groceries delivered but was unable to pick them up from the porch and had to re-home her husky after realizing that she could not take care of him. Palliative care nursing was supposed to come daily to help with ADL's, but were not coming as often as expected. The patient lives by herself and  has limited social support, as her brother is located in [place] and she has not talked to her son in 10+ years. Her sister is located in [place], [place]. The patient had been prescribed 56 oxycodone  pills upon her discharge from SNF, and initially took 14 of them. This just made me sleep for 3 days. She proceeded to then take 40 pills and didn't plan to wake up.     The patient endorses worsening depression and SI over the past month due to her declining health. She expresses that I'm just a burden and expresses not wanting to go on due to her current state of health. She endorses feelings of hopelessness, guilt, worthlessness and that she would be better off dead. She denies HI, AVH, mania, and nightmares. The patient requires 2-3 L of oxygen at baseline and has large bilateral anterior lower leg wounds that are wrapped. She expresses frustration with her admission to the psych unit, as she keeps to herself and does not plan to attend group therapy sessions. She is completely bed bound and thus not able to call family members or have meals, but says it's okay, just let me finish what I started.   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 Yes.    Has the patient been a risk to self in the past 6 months? No.  Has the patient been a risk to self within the distant past? No.  Is the patient a risk to others? No.  Has the patient been a risk to others in the past 6 months? No.  Has the patient been a risk to others within the distant past? No.   [place] Scale:  Flowsheet Row Admission (Current) from 11/05/2023 in [name] from 11/02/2023 in [facility] Department at [facility] ED from 10/04/2023 in [facility] Emergency Department at [facility]  C-SSRS RISK CATEGORY High Risk High Risk No Risk     Past Medical History:  Past Medical History:  Diagnosis Date   A-fib (HCC)    Anemia    Anticoagulated on Coumadin , chronically 09/03/2011   Anxiety    Arthritis    right hip; both knees; left wrist/shoulder; back (01/19/2013   Bleeding on Coumadin  08/2012; 01/18/2013   [facility] admissions (01/19/2013)   CHF (congestive heart failure) (HCC)    2-3 times (01/19/2013)   Chronic lower back pain    Depression    DVT (deep venous thrombosis) (HCC) 10 years ago   numerous/notes 01/18/2013   GERD (gastroesophageal reflux disease)    Gout    Headache(784.0)    maybe weekly (01/19/2013)   Heart murmur    High cholesterol    been off RX for this at one time (01/18/2013)   History of blood transfusion 1983; 04/2012   3 w/ childbirth; hospitalized for pain (01/19/2013)   Hypertension    Hypothyroidism    Migraines    twice/yr maybe (01/19/2013)   Obstructive sleep apnea 05/03/2012   OSA (obstructive sleep apnea)    sent me for test in 04/2012; never ordered mask, etc (01/19/2013)   PE (pulmonary thromboembolism) (HCC) 3 years ag0   3/notes 01/18/2013   Pneumonia before  2011   once' (01/18/2013)   Renal disorder    kindey function low; Metformin was destroying my kidneys (01/19/2013)   Shortness of breath    only related to my CHF (01/18/2013)   Swelling of hand 08/31/2014   RT HAND   Type II diabetes mellitus (HCC)    UTI (urinary tract infection) 08/31/2014    Past Surgical History:  Procedure Laterality Date   CARDIAC CATHETERIZATION N/A 03/13/2015   Procedure: Right/Left Heart Cath and Coronary Angiography;  Surgeon: Gordy Bergamo, MD;  Location: [facility] INVASIVE CV LAB;  Service: Cardiovascular;  Laterality: N/A;   CATARACT EXTRACTION W/ INTRAOCULAR LENS  IMPLANT, BILATERAL Bilateral 2006-2011   CESAREAN SECTION  1983   CHOLECYSTECTOMY  ~ 2002   COLONOSCOPY N/A 01/21/2013   Procedure: COLONOSCOPY;  Surgeon: Belvie JONETTA Just, MD;  Location: [facility] ENDOSCOPY;  Service: Endoscopy;  Laterality: N/A;   EYE SURGERY Bilateral    multiple (01/18/2013)   PACEMAKER  IMPLANT N/A 08/31/2018   Symptomatic bradycardia due to mobitz II second degree AV block, permanent afib/ atypical atrial flutter implanted by Dr Kelsie PRESCOTT PLANA REPAIR OF RETINAL DEATACHMENT Right    PARS PLANA VITRECTOMY Bilateral 2004-2006   several (01/18/2013)   REFRACTIVE SURGERY Bilateral    for stigmatism (01/18/2013)   REFRACTIVE SURGERY Left ~ 11/2012   to puff it up cause vision got hazy (01/18/2013)   RIGHT HEART CATH N/A 12/30/2018   Procedure: RIGHT HEART CATH;  Surgeon: Cherrie Toribio SAUNDERS, MD;  Location: [facility] INVASIVE CV LAB;  Service: Cardiovascular;  Laterality: N/A;   RIGHT/LEFT HEART CATH AND CORONARY ANGIOGRAPHY N/A 09/07/2023   Procedure: RIGHT/LEFT HEART CATH AND CORONARY ANGIOGRAPHY;  Surgeon: Wendel Lurena POUR, MD;  Location: [facility] INVASIVE CV LAB;  Service: Cardiovascular;  Laterality: N/A;   VENA CAVA FILTER PLACEMENT  2011?   Family History:  Family History  Problem Relation Age of Onset   Cerebral aneurysm Mother    Hypertension Father    Cerebral aneurysm Maternal  Grandfather    Cerebral aneurysm Maternal Aunt    Cancer Maternal Uncle     Social History:  Social History   Substance and Sexual Activity  Alcohol Use Not Currently   Comment: rare now but never a heavy drinker     Social History   Substance and Sexual Activity  Drug Use No      Allergies:   Allergies  Allergen Reactions   Ms Contin [Morphine] Hives, Rash and Other (See Comments)    Broke out in brown spots all over.    Lab Results:  Results for orders placed or performed during the hospital encounter of 11/05/23 (from the past 48 hours)  Glucose, capillary     Status: Abnormal   Collection Time: 11/05/23  7:52 PM  Result Value Ref Range   Glucose-Capillary 170 (H) 70 - 99 mg/dL    Comment: Glucose reference range applies only to samples taken after fasting for at least 8 hours.  Glucose, capillary     Status: Abnormal   Collection Time: 11/06/23  7:21 AM  Result Value Ref Range   Glucose-Capillary 152 (H) 70 - 99 mg/dL    Comment: Glucose reference range applies only to samples taken after fasting for at least 8 hours.    Blood Alcohol level:  Lab Results  Component Value Date   [facility] <15 11/02/2023    Metabolic Disorder Labs:  Lab Results  Component Value Date   HGBA1C 5.7 (H) 09/04/2023   MPG 116.89 09/04/2023   MPG 125.5 09/09/2021   No results found for: PROLACTIN Lab Results  Component Value Date   CHOL 105 09/03/2023   TRIG 56 09/03/2023   HDL 50 09/03/2023   CHOLHDL 2.1 09/03/2023   VLDL 11 09/03/2023   LDLCALC 44 09/03/2023   LDLCALC 48 08/30/2018    Current Medications: Current Facility-Administered Medications  Medication Dose Route Frequency Provider Last Rate Last Admin   acetaminophen  (TYLENOL ) tablet 650 mg  650 mg Oral Q6H PRN Mannie Jerel PARAS, NP   650 mg at 11/05/23 2158   [START ON 11/07/2023] allopurinol  (ZYLOPRIM ) tablet 50 mg  50 mg Oral QODAY Stevens, Terry J, NP       alum & mag hydroxide-simeth (MAALOX/MYLANTA) 200-200-20  MG/5ML suspension 30 mL  30 mL Oral Q4H PRN Mannie Jerel PARAS, NP       amLODipine  (NORVASC ) tablet 10 mg  10 mg Oral Daily Mannie Jerel PARAS, NP  apixaban  (ELIQUIS ) tablet 5 mg  5 mg Oral BID Mannie Jerel PARAS, NP   5 mg at 11/05/23 2158   cephALEXin  (KEFLEX ) capsule 500 mg  500 mg Oral Q12H Mannie Jerel PARAS, NP   500 mg at 11/05/23 2158   citalopram  (CELEXA ) tablet 40 mg  40 mg Oral QHS Mannie Jerel PARAS, NP   40 mg at 11/05/23 2157   diazepam  (VALIUM ) tablet 5 mg  5 mg Oral BID PRN Mannie Jerel PARAS, NP       ferrous sulfate  tablet 325 mg  325 mg Oral Daily Mannie Jerel PARAS, NP       guaiFENesin -dextromethorphan  (ROBITUSSIN DM) 100-10 MG/5ML syrup 5 mL  5 mL Oral Q8H PRN Mannie Jerel PARAS, NP       insulin  aspart (novoLOG ) injection 0-15 Units  0-15 Units Subcutaneous TID WC Mannie Jerel PARAS, NP       insulin  aspart (novoLOG ) injection 0-5 Units  0-5 Units Subcutaneous QHS Mannie Jerel PARAS, NP       levothyroxine  (SYNTHROID ) tablet 75 mcg  75 mcg Oral QAC breakfast Mannie Jerel PARAS, NP   75 mcg at 11/06/23 9385   magnesium  hydroxide (MILK OF MAGNESIA) suspension 30 mL  30 mL Oral Daily PRN Mannie Jerel PARAS, NP       melatonin tablet 10 mg  10 mg Oral QHS Mannie Jerel PARAS, NP   10 mg at 11/05/23 2159   multivitamin with minerals tablet 1 tablet  1 tablet Oral Daily Mannie Jerel PARAS, NP       nystatin  (MYCOSTATIN /NYSTOP ) topical powder   Topical TID Orlander Norwood, MD   1 Application at 11/05/23 2202   OLANZapine  (ZYPREXA ) injection 5 mg  5 mg Intramuscular TID PRN Mannie Jerel PARAS, NP       OLANZapine  zydis (ZYPREXA ) disintegrating tablet 5 mg  5 mg Oral TID PRN Mannie Jerel PARAS, NP       oxyCODONE  (Oxy IR/ROXICODONE ) immediate release tablet 5 mg  5 mg Oral Q6H PRN Mannie Jerel PARAS, NP       rosuvastatin  (CRESTOR ) tablet 10 mg  10 mg Oral Daily Mannie Jerel PARAS, NP       torsemide  (DEMADEX ) tablet 40 mg  40 mg Oral BID Mannie Jerel PARAS, NP       PTA Medications: Medications Prior to  Admission  Medication Sig Dispense Refill Last Dose/Taking   allopurinol  (ZYLOPRIM ) 100 MG tablet Take 50 mg by mouth every other day.      amLODipine  (NORVASC ) 5 MG tablet Take 2 tablets (10 mg total) by mouth daily.      apixaban  (ELIQUIS ) 5 MG TABS tablet Take 5 mg by mouth 2 (two) times daily.      Biotin  w/ Vitamins C & E (HAIR SKIN & NAILS GUMMIES PO) Take 1 each by mouth daily.      citalopram  (CELEXA ) 40 MG tablet Take 40 mg by mouth at bedtime.      diazepam  (VALIUM ) 5 MG tablet Take 1 tablet (5 mg total) by mouth 2 (two) times daily as needed for anxiety or muscle spasms. 10 tablet 0    diphenhydramine -acetaminophen  (TYLENOL  PM) 25-500 MG TABS tablet Take 1 tablet by mouth at bedtime.      ferrous sulfate  325 (65 FE) MG EC tablet Take 325 mg by mouth daily.      guaiFENesin -dextromethorphan  (ROBITUSSIN DM) 100-10 MG/5ML syrup Take by mouth in the morning, at noon, and at bedtime.      insulin   aspart (NOVOLOG ) 100 UNIT/ML injection Inject 6 Units into the skin 3 (three) times daily with meals.      insulin  glargine (LANTUS  SOLOSTAR) 100 UNIT/ML Solostar Pen Inject 20 Units into the skin daily.      levothyroxine  (SYNTHROID ) 75 MCG tablet Take 75 mcg by mouth daily before breakfast. PATIENT ONLY TAKES NAME BRAND      Melatonin 10 MG TABS Take 10 mg by mouth.      Multiple Vitamins-Minerals (CENTRUM SILVER) CHEW Chew 2 each by mouth daily.      oxyCODONE  (OXY IR/ROXICODONE ) 5 MG immediate release tablet Take 5 mg by mouth every 6 (six) hours as needed for severe pain (pain score 7-10).      rosuvastatin  (CRESTOR ) 10 MG tablet Take 1 tablet (10 mg total) by mouth daily. 30 tablet 0    torsemide  (DEMADEX ) 20 MG tablet Take 2 tablets (40mg ) by mouth twice daily, in the morning and at 4:00pm 180 tablet 0     Psychiatric Specialty Exam:  Presentation  General Appearance: Morbidly obese female laying in bed, hooked up to oxygen via nasal canula  Eye Contact: Fair Speech: Clear and  coherent Speech Volume: Fair   Mood and Affect  Mood: Depressed, frustrated  Affect: Congruent, tearful  Thought Process  Thought Processes: Coherent Descriptions of Associations: Orientation: Full (person, place, and time) Thought Content: Illogical Hallucinations: None Ideas of Reference: None Suicidal Thoughts: Yes, currently active Homicidal Thoughts: No  Sensorium  Memory: Fair Judgment: Impaired Insight: Shallow  Executive Functions  Concentration: Fair Attention Span: Fair Recall: [employer] Knowledge: Fair Language: Fair  Psychomotor Activity  Psychomotor Activity: Normal  Assets  Assets: Desire for improvement, resilience   Musculoskeletal: Strength & Muscle Tone: decreased Gait & Station: unable to stand  Physical Exam: Physical Exam Vitals and nursing note reviewed.  Constitutional:      Appearance: She is morbidly obese. She is ill-appearing.     Interventions: Nasal cannula in place.  Cardiovascular:     Rate and Rhythm: Bradycardia present.  Pulmonary:     Breath sounds: Transmitted upper airway sounds present.  Musculoskeletal:     Right lower leg: Swelling present.     Left lower leg: Swelling present.     Comments: Bilateral anterior lower leg wounds/ulcerations that are wrapped in bandages  Neurological:     Mental Status: She is alert and oriented to person, place, and time.  Psychiatric:        Mood and Affect: Mood is anxious and depressed. Affect is tearful.        Speech: Speech normal.        Behavior: Behavior is withdrawn.        Thought Content: Thought content is not paranoid or delusional. Thought content includes suicidal ideation. Thought content does not include homicidal ideation.        Cognition and Memory: Cognition and memory normal.    Review of Systems  Constitutional:  Positive for malaise/fatigue.  Respiratory:  Positive for cough.   Cardiovascular:  Positive for orthopnea and leg swelling.  Skin:   Positive for rash.  Neurological:  Positive for tremors.  Psychiatric/Behavioral:  Positive for depression and suicidal ideas. Negative for hallucinations, memory loss and substance abuse. The patient is nervous/anxious.    Blood pressure (!) 141/37, pulse (!) 50, temperature 97.9 F (36.6 C), resp. rate 18, height 5' 1 (1.549 m), weight 136.1 kg, SpO2 99%. Body mass index is 56.68 kg/m.  Principal Diagnosis: MDD (major  depressive disorder), recurrent severe, without psychosis (HCC) Diagnosis:  Principal Problem:   MDD (major depressive disorder), recurrent severe, without psychosis (HCC)   Clinical Decision Making: Patient is exhibiting s/sx consistent with MDD, severe in nature due to her suicide attempt via OD. She is medically complex and requires a high level of care. Due to her active and ongoing SI, she requires continued inpatient observation, safety monitoring, and medication management.   Treatment Plan Summary:  Safety and Monitoring:             -- Voluntary admission to inpatient psychiatric unit for safety, stabilization and treatment             -- Daily contact with patient to assess and evaluate symptoms and progress in treatment             -- Patient's case to be discussed in multi-disciplinary team meeting             -- Observation Level: q15 minute checks             -- Vital signs:  q12 hours             -- Precautions: suicide, elopement, and assault   2. Psychiatric Diagnoses and Treatment:               Major depressive disorder, severe, without psychotic features  -Continue home Celexa  40 mg PO once daily and Valium  5 mg PO bid  -Focus on improving coping skills  -Referral for outpatient psychiatrist upon discharge  -Will reach out to the patient's brother and sister   -- The risks/benefits/side-effects/alternatives to this medication were discussed in detail with the patient and time was given for questions. The patient consents to medication trial.                 -- Metabolic profile and EKG monitoring obtained while on an atypical antipsychotic (BMI: Lipid Panel: HbgA1c: QTc:)              -- Encouraged patient to participate in unit milieu and in scheduled group therapies                            3. Medical Issues Being Addressed:  Multiple complex medical issues including worsening diastolic heart failure and pulmonary HTN on baseline oxygen, CKD stage IV, morbid obesity and limited ability to ambulate. UTI noted on UA in the ED.   -UTI being treated with Keflex  500 mg PO q12h for 5 days -Has been bradycardic (HR in the low 50's) x3, hypotensive with diastolic in the 50's. Will withhold home meds as indicated for these problems.  -Continue home medications, holding antihypertensives when diastolic BP is <60 mmHg. -Consult wound care for bilateral lower leg wounds -Maintain SpO2 > 95%, currently on 3L [place]     4. Discharge Planning:              -- Social work and case management to assist with discharge planning and identification of hospital follow-up needs prior to discharge             -- Estimated LOS: 5-7 days             -- Discharge Concerns: Need to establish a safety plan; Medication compliance and effectiveness             -- Discharge Goals: Return home with outpatient referrals follow ups  Physician Treatment Plan for Primary  Diagnosis: MDD (major depressive disorder), recurrent severe, without psychosis (HCC) Long Term Goal(s): Improvement in symptoms so as ready for discharge  Short Term Goals: Ability to identify changes in lifestyle to reduce recurrence of condition will improve, Ability to verbalize feelings will improve, Ability to disclose and discuss suicidal ideas, Ability to demonstrate self-control will improve, Ability to identify and develop effective coping behaviors will improve, and Ability to identify triggers associated with substance abuse/mental health issues will improve  Physician Treatment Plan for  Secondary Diagnosis: Principal Problem:   MDD (major depressive disorder), recurrent severe, without psychosis (HCC)  Long Term Goal(s): Improvement in symptoms so as ready for discharge  Short Term Goals: Ability to identify changes in lifestyle to reduce recurrence of condition will improve, Ability to disclose and discuss suicidal ideas, Ability to demonstrate self-control will improve, Ability to identify and develop effective coping behaviors will improve, and Ability to identify triggers associated with substance abuse/mental health issues will improve  I certify that inpatient services furnished can reasonably be expected to improve the patient's condition.    Recardo Poche, Student-PA 8/1/20259:34 AM

## 2023-11-07 DIAGNOSIS — F332 Major depressive disorder, recurrent severe without psychotic features: Secondary | ICD-10-CM | POA: Diagnosis not present

## 2023-11-07 LAB — GLUCOSE, CAPILLARY
Glucose-Capillary: 151 mg/dL — ABNORMAL HIGH (ref 70–99)
Glucose-Capillary: 161 mg/dL — ABNORMAL HIGH (ref 70–99)
Glucose-Capillary: 144 mg/dL — ABNORMAL HIGH (ref 70–99)
Glucose-Capillary: 179 mg/dL — ABNORMAL HIGH (ref 70–99)

## 2023-11-07 MED ORDER — TRIPLE ANTIBIOTIC 3.5-400-5000 EX OINT
1.0000 | TOPICAL_OINTMENT | Freq: Two times a day (BID) | CUTANEOUS | Status: DC | PRN
  Administered 2023-11-07 – 2023-11-08 (×2): 1 via CUTANEOUS
  Filled 2023-11-07 (×3): qty 1

## 2023-11-07 NOTE — Plan of Care (Signed)
   Problem: Education: Goal: Ability to make informed decisions regarding treatment will improve Outcome: Progressing   Problem: Coping: Goal: Coping ability will improve Outcome: Progressing
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
## 2023-11-07 NOTE — [facility] Suicide Risk Assessment (Signed)
 [facility] INPATIENT:  Family/Significant Other Suicide Prevention Education  Suicide Prevention Education:  Contact Attempts: Alm Bucks, brother, [phone] and Niels Barge, sister, [phone],  has been identified by the patient as the family member/significant other with whom the patient will be residing, and identified as the person(s) who will aid the patient in the event of a mental health crisis.  With written consent from the patient, two attempts were made to provide suicide prevention education, prior to and/or following the patient's discharge.  We were unsuccessful in providing suicide prevention education.  A suicide education pamphlet was given to the patient to share with family/significant other.  Date and time of first attempt: 11/07/23 at 3:58 pm Date and time of second attempt: attempt needed  [name] 11/07/2023, 3:57 PM

## 2023-11-07 NOTE — Progress Notes (Signed)
   11/07/23 1200  Psych Admission Type (Psych Patients Only)  Admission Status Voluntary  Psychosocial Assessment  Patient Complaints Depression  Eye Contact Fair  Facial Expression Sad  Affect Sullen  Speech Soft  Interaction Minimal  Motor Activity Slow  Appearance/Hygiene In hospital gown  Behavior Characteristics Cooperative  Mood Depressed  Thought Process  Coherency WDL  Content WDL  Delusions WDL  Perception WDL  Hallucination None reported or observed  Judgment WDL  Confusion WDL  Danger to Self  Current suicidal ideation? Passive  Agreement Not to Harm Self Yes  Description of Agreement verbal  Danger to Others  Danger to Others None reported or observed

## 2023-11-07 NOTE — Group Note (Signed)
 Date:  11/07/2023 Time:  6:03 PM  Group Topic/Focus:  Early Warning Signs:   The focus of this group is to help patients identify signs or symptoms they exhibit before slipping into an unhealthy state or crisis.    Participation Level:  Did Not Attend   [name] [name] 11/07/2023, 6:03 PM

## 2023-11-07 NOTE — Progress Notes (Signed)
   11/07/23 1200  Charting Type  Charting Type Shift assessment  Neurological  Neuro (WDL) X  Orientation Level Oriented X4  Neuro Symptoms Depression  HEENT  HEENT (WDL) WDL  Respiratory  Respiratory (WDL) X (SOB/O2 @ 3L cont)  Cardiac  Cardiac (WDL) X (CHF/diastolic HF)  Vascular  Vascular (WDL) X (low diastolic BP)  Edema Generalized  Generalized Edema Non-pitting  Integumentary  Integumentary (WDL) X (pls see admission skin assessment)  Braden Scale (Ages 8 and up)  Sensory Perceptions 4  Moisture 3  Activity 2  Mobility 2  Nutrition 3  Friction and Shear 2  Braden Scale Score 16  Braden Interventions  Required Braden Scale Bundle Interventions *See Row Information* At Risk for pressure injury (13-18) - At Risk requirements implemented  Musculoskeletal  Musculoskeletal (WDL) X  Assistive Device Wheelchair  Generalized Weakness Yes  Gastrointestinal  Gastrointestinal (WDL) WDL  Last BM Date  11/04/23  GU Assessment  Genitourinary (WDL) WDL  Neurological  Level of Consciousness Alert

## 2023-11-07 NOTE — Group Note (Signed)
 Date:  11/07/2023 Time:  4:05 PM  Group Topic/Focus:  Crisis Planning:   The purpose of this group is to help patients create a crisis plan for use upon discharge or in the future, as needed.    Participation Level:  Did Not Attend    Camellia HERO Mae Cianci 11/07/2023, 4:05 PM

## 2023-11-07 NOTE — Group Note (Signed)
 Date:  11/07/2023 Time:  6:42 AM  Group Topic/Focus:  Goals Group:   The focus of this group is to help patients establish daily goals to achieve during treatment and discuss how the patient can incorporate goal setting into their daily lives to aide in recovery. Relapse Prevention Planning:   The focus of this group is to define relapse and discuss the need for planning to combat relapse. Wrap-Up Group:   The focus of this group is to help patients review their daily goal of treatment and discuss progress on daily workbooks.    Participation Level:  Did Not Attend   Additional Comments:    Kristen VEAR Gibbon 11/07/2023, 6:42 AM

## 2023-11-07 NOTE — Progress Notes (Signed)
   11/07/23 0400  Psych Admission Type (Psych Patients Only)  Admission Status Voluntary  Psychosocial Assessment  Patient Complaints Depression  Eye Contact Fair  Facial Expression Sad  Affect Sullen  Speech Soft  Interaction Minimal  Motor Activity Slow  Appearance/Hygiene In hospital gown  Behavior Characteristics Appropriate to situation  Mood Depressed  Thought Process  Coherency WDL  Content WDL  Delusions WDL  Perception WDL  Hallucination None reported or observed  Judgment WDL  Confusion WDL  Danger to Self  Current suicidal ideation? Passive  Self-Injurious Behavior No self-injurious ideation or behavior indicators observed or expressed   Agreement Not to Harm Self Yes  Description of Agreement Verbal  Danger to Others  Danger to Others None reported or observed

## 2023-11-07 NOTE — Group Note (Signed)
 Date:  11/07/2023 Time:  10:57 PM  Group Topic/Focus:  Identifying Needs:   The focus of this group is to help patients identify their personal needs that have been historically problematic and identify healthy behaviors to address their needs.    Participation Level:  Did Not Attend  Participation Quality:  Did Not Attend  Affect:  Did Not Attend  Cognitive:  Did Not Attend  Insight: None  Engagement in Group:  None  Modes of Intervention:  Did Not Attend  Additional Comments:    Laymon ONEIDA Finder 11/07/2023, 10:57 PM

## 2023-11-07 NOTE — Evaluation (Signed)
 Physical Therapy Evaluation Patient Details Name: [name] MRN: [id number] DOB: 03-19-1956 Today's Date: 11/07/2023  History of Present Illness  [name] is a 68 y.o. female admitted: Presented to the EDfor 11/02/2023 12:21 PM for intentional ingestion of oxycodone  pills in an attempt to harm self. She carries the psychiatric diagnoses of MDD and anxiety and has a past medical history of  hypertension, type 2 diabetes, atrial fibrillation, DVT/PE status post IVC filter, HFpEF, single-chamber PPM and chronic pain  Clinical Impression  Pt was initially resistant to doing a lot stating I can do it, but it'll take me 10 minutes to get my breath back.  She ultimately did agree that a little mobility is good for her and was willing to do some limited in-room ambulation.  She generally needed only minA with mobility, etc and was able to do ~46ft with the walker on 3L with SpO2 staying in the 90s but pt having significant DOE/SOB with the modest effort.  She endorses having more and more difficulty managing at home, will benefit from continued PT to address functional limitations.        If plan is discharge home, recommend the following: A little help with walking and/or transfers;A little help with bathing/dressing/bathroom;Assistance with cooking/housework;Direct supervision/assist for medications management;Help with stairs or ramp for entrance;Assist for transportation   Can travel by private vehicle   Yes    Equipment Recommendations  (TBD at next venue of care)  Recommendations for Other Services       Functional Status Assessment Patient has had a recent decline in their functional status and demonstrates the ability to make significant improvements in function in a reasonable and predictable amount of time.     Precautions / Restrictions Precautions Precautions: Fall Restrictions Weight Bearing Restrictions Per Provider Order: No      Mobility  Bed Mobility Overal  bed mobility: Needs Assistance Bed Mobility: Supine to Sit     Supine to sit: Min assist     General bed mobility comments: with handrails available, pt was able to get to sitting w/ only minimal intervention    Transfers Overall transfer level: Needs assistance Equipment used: Rolling walker (2 wheels) Transfers: Sit to/from Stand Sit to Stand: Contact guard assist, From elevated surface           General transfer comment: Pt unable to rise from (very low) standard height BH bed but with hand rails and cuing for appropriate use/weight shift pt was able to attain standing with only light minA to insure hips remain forward and up during transition    Ambulation/Gait Ambulation/Gait assistance: Contact guard assist Gait Distance (Feet): 35 Feet Assistive device: Rolling walker (2 wheels)         General Gait Details: Pt with slow but safe cadence, relatively quick to fatigue with some DOE, but SpO2 remains in the 90s, HR remained stable  Stairs            Wheelchair Mobility     Tilt Bed    Modified Rankin (Stroke Patients Only)       Balance Overall balance assessment: Needs assistance Sitting-balance support: Single extremity supported Sitting balance-[name]: Good     Standing balance support: Bilateral upper extremity supported Standing balance-[name]: Fair Standing balance comment: Pt unable to trial ambulation w/o AD - no LOBs but UEs required  Pertinent Vitals/Pain Pain Assessment Pain Assessment: No/denies pain Faces Pain Scale: Hurts little more Pain Location: B distal LEs    Home Living Family/patient expects to be discharged to:: Skilled nursing facility (LTC) Living Arrangements: Alone                 Additional Comments: Pt living at home alone with use of RW for mobility and recently placed on 2Ls of O2. Pt endorses no support/assist at home.    Prior Function Prior Level of  Function : Independent/Modified Independent             Mobility Comments: use RW for ambulation outside and rollator in house, rarely goes out; since recent discharge, needed +2 assist to manage stairs but reports 3 months ago had been able to manage stairs mod I ADLs Comments: had been mod I for ADLs, groceries delivered, uses [employer] - reports recently everything has become more difficult     Extremity/Trunk Assessment   Upper Extremity Assessment Upper Extremity Assessment: Generalized weakness;Overall [facility] for tasks assessed    Lower Extremity Assessment Lower Extremity Assessment: Generalized weakness;Overall WFL for tasks assessed       Communication   Communication Communication: No apparent difficulties    Cognition Arousal: Alert Behavior During Therapy: WFL for tasks assessed/performed   PT - Cognitive impairments: No apparent impairments                         Following commands: Intact       Cueing Cueing Techniques: Verbal cues     General Comments      Exercises     Assessment/Plan    PT Assessment Patient needs continued PT services  PT Problem List Decreased strength;Decreased range of motion;Decreased activity tolerance;Decreased balance;Decreased mobility;Decreased coordination;Decreased cognition;Decreased knowledge of use of DME;Decreased safety awareness;Obesity       PT Treatment Interventions DME instruction;Gait training;Functional mobility training;Therapeutic activities;Therapeutic exercise;Neuromuscular re-education;Balance training;Patient/family education    PT Goals (Current goals can be found in the Care Plan section)  Acute Rehab PT Goals Patient Stated Goal: Pt was able to ambulate PT Goal Formulation: With patient Time For Goal Achievement: 11/20/23 Potential to Achieve Goals: Fair    Frequency Min 1X/week     Co-evaluation               AM-PAC PT 6 Clicks Mobility  Outcome Measure Help needed  turning from your back to your side while in a flat bed without using bedrails?: None Help needed moving from lying on your back to sitting on the side of a flat bed without using bedrails?: None Help needed moving to and from a bed to a chair (including a wheelchair)?: A Little Help needed standing up from a chair using your arms (e.g., wheelchair or bedside chair)?: A Little Help needed to walk in hospital room?: A Little Help needed climbing 3-5 steps with a railing? : A Lot 6 Click Score: 19    End of Session Equipment Utilized During Treatment: Gait belt Activity Tolerance: Patient tolerated treatment well;Patient limited by fatigue Patient left: in bed;with call bell/phone within reach Nurse Communication: Mobility status PT Visit Diagnosis: Muscle weakness (generalized) (M62.81);Difficulty in walking, not elsewhere classified (R26.2)    Time: 8784-8765 PT Time Calculation (min) (ACUTE ONLY): 19 min   Charges:   PT Evaluation $PT Eval Low Complexity: 1 Low PT Treatments $Gait Training: 8-22 mins PT General Charges $$ ACUTE PT VISIT: 1 Visit  Carmin JONELLE Deed, DPT 11/07/2023, 3:17 PM

## 2023-11-07 NOTE — Plan of Care (Signed)
   Problem: Coping: Goal: Coping ability will improve Outcome: Progressing   Problem: Medication: Goal: Compliance with prescribed medication regimen will improve Outcome: Progressing

## 2023-11-08 DIAGNOSIS — I872 Venous insufficiency (chronic) (peripheral): Secondary | ICD-10-CM

## 2023-11-08 DIAGNOSIS — Z86718 Personal history of other venous thrombosis and embolism: Secondary | ICD-10-CM

## 2023-11-08 DIAGNOSIS — Z79899 Other long term (current) drug therapy: Secondary | ICD-10-CM

## 2023-11-08 DIAGNOSIS — Z87891 Personal history of nicotine dependence: Secondary | ICD-10-CM

## 2023-11-08 DIAGNOSIS — L97928 Non-pressure chronic ulcer of unspecified part of left lower leg with other specified severity: Secondary | ICD-10-CM

## 2023-11-08 DIAGNOSIS — Z86711 Personal history of pulmonary embolism: Secondary | ICD-10-CM

## 2023-11-08 DIAGNOSIS — Z794 Long term (current) use of insulin: Secondary | ICD-10-CM

## 2023-11-08 DIAGNOSIS — F3342 Major depressive disorder, recurrent, in full remission: Secondary | ICD-10-CM

## 2023-11-08 DIAGNOSIS — Z7901 Long term (current) use of anticoagulants: Secondary | ICD-10-CM

## 2023-11-08 LAB — CBC WITH DIFFERENTIAL/PLATELET
Abs Immature Granulocytes: 0.04 10*3/uL (ref 0.00–0.07)
Basophils Absolute: 0 10*3/uL (ref 0.0–0.1)
Basophils Relative: 0 %
Eosinophils Absolute: 0.2 10*3/uL (ref 0.0–0.5)
Eosinophils Relative: 2 %
HCT: 30 % — ABNORMAL LOW (ref 36.0–46.0)
Hemoglobin: 9 g/dL — ABNORMAL LOW (ref 12.0–15.0)
Immature Granulocytes: 1 %
Lymphocytes Relative: 5 %
Lymphs Abs: 0.4 10*3/uL — ABNORMAL LOW (ref 0.7–4.0)
MCH: 32.4 pg (ref 26.0–34.0)
MCHC: 30 g/dL (ref 30.0–36.0)
MCV: 107.9 fL — ABNORMAL HIGH (ref 80.0–100.0)
Monocytes Absolute: 0.5 10*3/uL (ref 0.1–1.0)
Monocytes Relative: 6 %
Neutro Abs: 6.8 10*3/uL (ref 1.7–7.7)
Neutrophils Relative %: 86 %
Platelets: 142 10*3/uL — ABNORMAL LOW (ref 150–400)
RBC: 2.78 MIL/uL — ABNORMAL LOW (ref 3.87–5.11)
RDW: 16.3 % — ABNORMAL HIGH (ref 11.5–15.5)
WBC: 7.9 10*3/uL (ref 4.0–10.5)
nRBC: 0 % (ref 0.0–0.2)

## 2023-11-08 LAB — GLUCOSE, CAPILLARY
Glucose-Capillary: 144 mg/dL — ABNORMAL HIGH (ref 70–99)
Glucose-Capillary: 173 mg/dL — ABNORMAL HIGH (ref 70–99)
Glucose-Capillary: 181 mg/dL — ABNORMAL HIGH (ref 70–99)
Glucose-Capillary: 191 mg/dL — ABNORMAL HIGH (ref 70–99)

## 2023-11-08 MED ORDER — NYSTATIN 100000 UNIT/GM EX POWD: Freq: Three times a day (TID) | CUTANEOUS | Status: DC | PRN

## 2023-11-08 NOTE — Progress Notes (Signed)
 [facility] MD Progress Note  11/08/2023 8:18 PM [name]  MRN:  [id number]  [name]. [name] is a 68 y/o female admitted to the [facility] gero-psych unit. She has significant and complex PMH including MDD, anxiety, HTN, DM2, a-fib on Eliquis , DVT/PE with IVF filter, HFpEF with pulmonary HTN, CKD stage IV, chronic pain, and morbid obesity with a BMI of 56 on 2-3L of oxygen at baseline. She is DNR/DNI with limited additional interventions on hospice, also receiving palliative care. She initially presented to the ED on 11/02/23 following an intentional overdose on 40 oxycodone  pills (5 mg). She was found by her home nurse the next morning who called 911.   Subjective: Patient was seen bedside she was lying in her bed she reports that still feels overwhelmed in life but denying any suicidal homicidal thoughts today, she has challenges with heart failure and is on oxygen waiting for hospice reportedly took some overdose on oxycodone  hospitalist team is following her  Principal Problem: MDD (major depressive disorder), recurrent severe, without psychosis (HCC) Diagnosis: Principal Problem:   MDD (major depressive disorder), recurrent severe, without psychosis (HCC)   Past Psychiatric History: MDD and anxiety, compliant on medication   Information collected from the patient and chart review   Prev Dx/Sx: MDD and anxiety Current Psych Provider: None Home Meds (current): Celexa  40 mg daily and Valium  5 mg bid Previous Med Trials: No Therapy: Not currently   Prior Psych Hospitalization: Denies  Prior Self Harm: Denies Prior Violence: Denies   Family Psych History: Depression, some alcohol abuse Family Hx suicide: Great nephew committed suicide   Social History:  Educational Hx: High school graduate 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 Hx: Denies Living Situation: Lives alone in private residence Spiritual Hx: Christian Access to weapons/lethal means: Denies    Substance  History Alcohol: Denies use History of alcohol withdrawal seizures: N/a History of DT's: N/a Tobacco: Former; quit in 1990 Illicit drugs: No Prescription drug abuse: No Rehab hx: No  Past Medical History:  Past Medical History:  Diagnosis Date   A-fib (HCC)    Anemia    Anticoagulated on Coumadin , chronically 09/03/2011   Anxiety    Arthritis    right hip; both knees; left wrist/shoulder; back (01/19/2013   Bleeding on Coumadin  08/2012; 01/18/2013   [facility] admissions (01/19/2013)   CHF (congestive heart failure) (HCC)    2-3 times (01/19/2013)   Chronic lower back pain    Depression    DVT (deep venous thrombosis) (HCC) 10 years ago   numerous/notes 01/18/2013   GERD (gastroesophageal reflux disease)    Gout    Headache(784.0)    maybe weekly (01/19/2013)   Heart murmur    High cholesterol    been off RX for this at one time (01/18/2013)   History of blood transfusion 1983; 04/2012   3 w/ childbirth; hospitalized for pain (01/19/2013)   Hypertension    Hypothyroidism    Migraines    twice/yr maybe (01/19/2013)   Obstructive sleep apnea 05/03/2012   OSA (obstructive sleep apnea)    sent me for test in 04/2012; never ordered mask, etc (01/19/2013)   PE (pulmonary thromboembolism) (HCC) 3 years ag0   3/notes 01/18/2013   Pneumonia before 2011   once' (01/18/2013)   Renal disorder    kindey function low; Metformin was destroying my kidneys (01/19/2013)   Shortness of breath    only related to my CHF (01/18/2013)   Swelling of hand 08/31/2014   RT HAND  Type II diabetes mellitus (HCC)    UTI (urinary tract infection) 08/31/2014    Past Surgical History:  Procedure Laterality Date   CARDIAC CATHETERIZATION N/A 03/13/2015   Procedure: Right/Left Heart Cath and Coronary Angiography;  Surgeon: Gordy Bergamo, MD;  Location: [facility] INVASIVE CV LAB;  Service: Cardiovascular;  Laterality: N/A;   CATARACT EXTRACTION W/ INTRAOCULAR LENS  IMPLANT, BILATERAL Bilateral  2006-2011   CESAREAN SECTION  1983   CHOLECYSTECTOMY  ~ 2002   COLONOSCOPY N/A 01/21/2013   Procedure: COLONOSCOPY;  Surgeon: Belvie JONETTA Just, MD;  Location: [facility] ENDOSCOPY;  Service: Endoscopy;  Laterality: N/A;   EYE SURGERY Bilateral    multiple (01/18/2013)   PACEMAKER IMPLANT N/A 08/31/2018   Symptomatic bradycardia due to mobitz II second degree AV block, permanent afib/ atypical atrial flutter implanted by Dr Kelsie PRESCOTT PLANA REPAIR OF RETINAL DEATACHMENT Right    PARS PLANA VITRECTOMY Bilateral 2004-2006   several (01/18/2013)   REFRACTIVE SURGERY Bilateral    for stigmatism (01/18/2013)   REFRACTIVE SURGERY Left ~ 11/2012   to puff it up cause vision got hazy (01/18/2013)   RIGHT HEART CATH N/A 12/30/2018   Procedure: RIGHT HEART CATH;  Surgeon: Cherrie Toribio SAUNDERS, MD;  Location: [facility] INVASIVE CV LAB;  Service: Cardiovascular;  Laterality: N/A;   RIGHT/LEFT HEART CATH AND CORONARY ANGIOGRAPHY N/A 09/07/2023   Procedure: RIGHT/LEFT HEART CATH AND CORONARY ANGIOGRAPHY;  Surgeon: Wendel Lurena POUR, MD;  Location: [facility] INVASIVE CV LAB;  Service: Cardiovascular;  Laterality: N/A;   VENA CAVA FILTER PLACEMENT  2011?   Family History:  Family History  Problem Relation Age of Onset   Cerebral aneurysm Mother    Hypertension Father    Cerebral aneurysm Maternal Grandfather    Cerebral aneurysm Maternal Aunt    Cancer Maternal Uncle     Social History:  Social History   Substance and Sexual Activity  Alcohol Use Not Currently   Comment: rare now but never a heavy drinker     Social History   Substance and Sexual Activity  Drug Use No    Social History   Socioeconomic History   Marital status: Single    Spouse name: Not on file   Number of children: Not on file   Years of education: Not on file   Highest education level: Not on file  Occupational History   Occupation: disabled  Tobacco Use   Smoking status: Former    Current packs/day: 0.00    Average packs/day:  0.1 packs/day for 30.0 years (1.5 ttl pk-yrs)    Types: Cigarettes    Start date: 70    Quit date: 1990    Years since quitting: 35.6   Smokeless tobacco: Never   Tobacco comments:    01/19/2013 quit smoking cigarettes in the early '90's  Vaping Use   Vaping status: Never Used  Substance and Sexual Activity   Alcohol use: Not Currently    Comment: rare now but never a heavy drinker   Drug use: No   Sexual activity: Never  Other Topics Concern   Not on file  Social History Narrative   Not on file   Social Drivers of Health   Financial Resource Strain: Not on file  Food Insecurity: Patient Declined (11/05/2023)   Hunger Vital Sign    Worried About Running Out of Food in the Last Year: Patient declined    Ran Out of Food in the Last Year: Patient declined  Transportation Needs: Patient Declined (11/05/2023)  PRAPARE - Administrator, Civil Service (Medical): Patient declined    Lack of Transportation (Non-Medical): Patient declined  Physical Activity: Not on file  Stress: Not on file  Social Connections: Socially Isolated (11/05/2023)   Social Connection and Isolation Panel    Frequency of Communication with Friends and Family: More than three times a week    Frequency of Social Gatherings with Friends and Family: Once a week    Attends Religious Services: Never    Database administrator or Organizations: No    Attends Engineer, structural: Never    Marital Status: Divorced   Additional Social History:                         Sleep: Fair Estimated Sleeping Duration (Last 24 Hours): 7.50-9.00 hours  Appetite:  Good  Current Medications: Current Facility-Administered Medications  Medication Dose Route Frequency Provider Last Rate Last Admin   acetaminophen  (TYLENOL ) tablet 650 mg  650 mg Oral Q6H PRN Mannie Jerel PARAS, NP   650 mg at 11/05/23 2158   allopurinol  (ZYLOPRIM ) tablet 50 mg  50 mg Oral QODAY Stevens, Terry J, NP   50 mg at  11/07/23 1109   alum & mag hydroxide-simeth (MAALOX/MYLANTA) 200-200-20 MG/5ML suspension 30 mL  30 mL Oral Q4H PRN Mannie Jerel PARAS, NP       amLODipine  (NORVASC ) tablet 10 mg  10 mg Oral Daily Mannie Jerel PARAS, NP   10 mg at 11/08/23 9094   apixaban  (ELIQUIS ) tablet 5 mg  5 mg Oral BID Mannie Jerel PARAS, NP   5 mg at 11/08/23 9094   citalopram  (CELEXA ) tablet 40 mg  40 mg Oral QHS Mannie Jerel PARAS, NP   40 mg at 11/07/23 2256   diazepam  (VALIUM ) tablet 5 mg  5 mg Oral BID PRN Mannie Jerel PARAS, NP       ferrous sulfate  tablet 325 mg  325 mg Oral Daily Mannie Jerel PARAS, NP   325 mg at 11/08/23 9090   guaiFENesin -dextromethorphan  (ROBITUSSIN DM) 100-10 MG/5ML syrup 5 mL  5 mL Oral Q8H PRN Mannie Jerel PARAS, NP       insulin  aspart (novoLOG ) injection 0-15 Units  0-15 Units Subcutaneous TID WC Mannie Jerel PARAS, NP   3 Units at 11/08/23 1646   insulin  aspart (novoLOG ) injection 0-5 Units  0-5 Units Subcutaneous QHS Mannie Jerel PARAS, NP       levothyroxine  (SYNTHROID ) tablet 75 mcg  75 mcg Oral QAC breakfast Mannie Jerel PARAS, NP   75 mcg at 11/08/23 9350   magnesium  hydroxide (MILK OF MAGNESIA) suspension 30 mL  30 mL Oral Daily PRN Mannie Jerel PARAS, NP       melatonin tablet 10 mg  10 mg Oral QHS Mannie Jerel PARAS, NP   10 mg at 11/07/23 2256   multivitamin with minerals tablet 1 tablet  1 tablet Oral Daily Mannie Jerel PARAS, NP   1 tablet at 11/08/23 9095   neomycin -bacitracin -polymyxin [phone] OINT 1 Application  1 Application Apply externally BID PRN Shrivastava, Aryendra, MD   1 Application at 11/08/23 0906   nystatin  (MYCOSTATIN /NYSTOP ) topical powder   Topical TID PRN Jazalynn Mireles R, MD       OLANZapine  (ZYPREXA ) injection 5 mg  5 mg Intramuscular TID PRN Mannie Jerel PARAS, NP       OLANZapine  zydis (ZYPREXA ) disintegrating tablet 5 mg  5 mg Oral TID PRN Mannie Jerel PARAS, NP  oxyCODONE  (Oxy IR/ROXICODONE ) immediate release tablet 5 mg  5 mg Oral Q6H PRN Mannie Jerel PARAS, NP        rosuvastatin  (CRESTOR ) tablet 10 mg  10 mg Oral Daily Mannie Jerel PARAS, NP   10 mg at 11/08/23 9093   torsemide  (DEMADEX ) tablet 40 mg  40 mg Oral BID Mannie Jerel PARAS, NP   40 mg at 11/08/23 1750    Lab Results:  Results for orders placed or performed during the hospital encounter of 11/05/23 (from the past 48 hours)  Glucose, capillary     Status: Abnormal   Collection Time: 11/07/23  7:46 AM  Result Value Ref Range   Glucose-Capillary 151 (H) 70 - 99 mg/dL    Comment: Glucose reference range applies only to samples taken after fasting for at least 8 hours.  Glucose, capillary     Status: Abnormal   Collection Time: 11/07/23 11:36 AM  Result Value Ref Range   Glucose-Capillary 161 (H) 70 - 99 mg/dL    Comment: Glucose reference range applies only to samples taken after fasting for at least 8 hours.  Glucose, capillary     Status: Abnormal   Collection Time: 11/07/23  4:44 PM  Result Value Ref Range   Glucose-Capillary 144 (H) 70 - 99 mg/dL    Comment: Glucose reference range applies only to samples taken after fasting for at least 8 hours.  Glucose, capillary     Status: Abnormal   Collection Time: 11/07/23  8:28 PM  Result Value Ref Range   Glucose-Capillary 179 (H) 70 - 99 mg/dL    Comment: Glucose reference range applies only to samples taken after fasting for at least 8 hours.  Glucose, capillary     Status: Abnormal   Collection Time: 11/08/23  7:39 AM  Result Value Ref Range   Glucose-Capillary 144 (H) 70 - 99 mg/dL    Comment: Glucose reference range applies only to samples taken after fasting for at least 8 hours.  Glucose, capillary     Status: Abnormal   Collection Time: 11/08/23 11:33 AM  Result Value Ref Range   Glucose-Capillary 173 (H) 70 - 99 mg/dL    Comment: Glucose reference range applies only to samples taken after fasting for at least 8 hours.  Glucose, capillary     Status: Abnormal   Collection Time: 11/08/23  4:32 PM  Result Value Ref Range    Glucose-Capillary 181 (H) 70 - 99 mg/dL    Comment: Glucose reference range applies only to samples taken after fasting for at least 8 hours.  CBC with Differential/Platelet     Status: Abnormal   Collection Time: 11/08/23  4:53 PM  Result Value Ref Range   WBC 7.9 4.0 - 10.5 K/uL   RBC 2.78 (L) 3.87 - 5.11 MIL/uL   Hemoglobin 9.0 (L) 12.0 - 15.0 g/dL   HCT 69.9 (L) 63.9 - 53.9 %   MCV 107.9 (H) 80.0 - 100.0 fL   MCH 32.4 26.0 - 34.0 pg   MCHC 30.0 30.0 - 36.0 g/dL   RDW 83.6 (H) 88.4 - 84.4 %   Platelets 142 (L) 150 - 400 K/uL   nRBC 0.0 0.0 - 0.2 %   Neutrophils Relative % 86 %   Neutro Abs 6.8 1.7 - 7.7 K/uL   Lymphocytes Relative 5 %   Lymphs Abs 0.4 (L) 0.7 - 4.0 K/uL   Monocytes Relative 6 %   Monocytes Absolute 0.5 0.1 - 1.0 K/uL   Eosinophils  Relative 2 %   Eosinophils Absolute 0.2 0.0 - 0.5 K/uL   Basophils Relative 0 %   Basophils Absolute 0.0 0.0 - 0.1 K/uL   Immature Granulocytes 1 %   Abs Immature Granulocytes 0.04 0.00 - 0.07 K/uL    Comment: Performed at [facility], [address] [place], [place] [address]  Glucose, capillary     Status: Abnormal   Collection Time: 11/08/23  7:14 PM  Result Value Ref Range   Glucose-Capillary 191 (H) 70 - 99 mg/dL    Comment: Glucose reference range applies only to samples taken after fasting for at least 8 hours.    Blood Alcohol level:  Lab Results  Component Value Date   [facility] <15 11/02/2023    Metabolic Disorder Labs: Lab Results  Component Value Date   HGBA1C 5.7 (H) 09/04/2023   MPG 116.89 09/04/2023   MPG 125.5 09/09/2021   No results found for: PROLACTIN Lab Results  Component Value Date   CHOL 105 09/03/2023   TRIG 56 09/03/2023   HDL 50 09/03/2023   CHOLHDL 2.1 09/03/2023   VLDL 11 09/03/2023   LDLCALC 44 09/03/2023   LDLCALC 48 08/30/2018    Physical Findings: AIMS:  ,  ,  ,  ,  ,  ,   CIWA:    COWS:     Musculoskeletal: Strength & Muscle Tone: within normal limits Gait &  Station: unable to stand Patient leans: N/A  Psychiatric Specialty Exam:  Presentation      Patient is a 68 year old female who appears stated age alert oriented x3 mood is okay affect is constricted lying in the bed thought processes logical and coherent denies any suicidal homicidal thoughts no perceptual disturbances  insight and judgment at baseline    Physical Exam: Physical Exam ROS Blood pressure (!) 149/47, pulse (!) 50, temperature (!) 97.5 F (36.4 C), resp. rate 15, height 5' 1 (1.549 m), weight 136.1 kg, SpO2 100%. Body mass index is 56.68 kg/m.   Treatment Plan Summary: Principal Diagnosis: MDD (major depressive disorder), recurrent severe, without psychosis (HCC) Diagnosis:  Principal Problem:   MDD (major depressive disorder), recurrent severe, without psychosis (HCC)     Clinical Decision Making: Patient is exhibiting s/sx consistent with MDD, severe in nature due to her suicide attempt via OD. She is medically complex and requires a high level of care. Due to her active and ongoing SI, she requires continued inpatient observation, safety monitoring, and medication management.    Treatment Plan Summary:   Safety and Monitoring:             -- Voluntary admission to inpatient psychiatric unit for safety, stabilization and treatment             -- Daily contact with patient to assess and evaluate symptoms and progress in treatment             -- Patient's case to be discussed in multi-disciplinary team meeting             -- Observation Level: q15 minute checks             -- Vital signs:  q12 hours             -- Precautions: suicide, elopement, and assault   2. Psychiatric Diagnoses and Treatment:               Major depressive disorder, severe, without psychotic features  -Continue home Celexa  40 mg PO once daily and  Valium  5 mg PO bid  -Focus on improving coping skills  -Referral for outpatient psychiatrist upon discharge  -Will reach out to the patient's  brother and sister   -- The risks/benefits/side-effects/alternatives to this medication were discussed in detail with the patient and time was given for questions. The patient consents to medication trial.                -- Metabolic profile and EKG monitoring obtained while on an atypical antipsychotic (BMI: Lipid Panel: HbgA1c: QTc:)              -- Encouraged patient to participate in unit milieu and in scheduled group therapies                            3. Medical Issues Being Addressed:  Multiple complex medical issues including worsening diastolic heart failure and pulmonary HTN on baseline oxygen, CKD stage IV, morbid obesity and limited ability to ambulate. UTI noted on UA in the ED.   -UTI being treated with Keflex  500 mg PO q12h for 5 days -Has been bradycardic (HR in the low 50's) x3, hypotensive with diastolic in the 50's. Will withhold home meds as indicated for these problems.  -Continue home medications, holding antihypertensives when diastolic BP is <60 mmHg. -Consult wound care for bilateral lower leg wounds -Maintain SpO2 > 95%, currently on 3L [place]      4. Discharge Planning:              -- Social work and case management to assist with discharge planning and identification of hospital follow-up needs prior to discharge             -- Estimated LOS: 5-7 days             -- Discharge Concerns: Need to establish a safety plan; Medication compliance and effectiveness             -- Discharge Goals: Return home with outpatient referrals follow ups   Physician Treatment Plan for Primary Diagnosis: MDD (major depressive disorder), recurrent severe, without psychosis (HCC) Long Term Goal(s): Improvement in symptoms so as ready for discharge   Short Term Goals: Ability to identify changes in lifestyle to reduce recurrence of condition will improve, Ability to verbalize feelings will improve, Ability to disclose and discuss suicidal ideas, Ability to demonstrate self-control will  improve, Ability to identify and develop effective coping behaviors will improve, and Ability to identify triggers associated with substance abuse/mental health issues will improve   Physician Treatment Plan for Secondary Diagnosis: Principal Problem:   MDD (major depressive disorder), recurrent severe, without psychosis (HCC)   Long Term Goal(s): Improvement in symptoms so as ready for discharge   Short Term Goals: Ability to identify changes in lifestyle to reduce recurrence of condition will improve, Ability to disclose and discuss suicidal ideas, Ability to demonstrate self-control will improve, Ability to identify and develop effective coping behaviors will improve, and Ability to identify triggers associated with substance abuse/mental health issues will improve    Millie JONELLE Manners, MD 11/08/2023, 8:18 PM

## 2023-11-08 NOTE — Significant Event (Signed)
 Rapid Response Event Note   Reason for Call : Pt was up to shower, banged her leg with wound in process, reported blood spurting from left leg wound   Initial Focused Assessment: pt alert and oriented x 4, laying in bed, dressings to both legs, left leg dressing partially saturated with blood.       Interventions: Took dressing down, looks like active bleed has slowed or stopped. Redressed leg, stat labs being ordered by Psych PA, and will ask Vascular to see pt, if they deem necessary.    Plan of Care: as above, Devere RN to call for further assistance.    Event Summary:   MD Notified:  Call Time: Arrival Time: End Time:  Tamyka Bezio A, RN

## 2023-11-08 NOTE — Consult Note (Signed)
 WOC Nurse Consult Note: patient with history of B lower leg edema and wounds, appear from venous insufficiency, last seen in ER 09/29/2023 for wounds; hospice nurse caring for at home per notes  Reason for Consult: wounds to lower legs  Wound type: full thickness appear to be r/t venous insufficiency  Pressure Injury POA: NA  Measurement: per nursing flowsheet  Wound azi:jeezjm red moist  Drainage (amount, consistency, odor) serous in photo  Periwound: edema, chronic skin changes consistent with venous stasis  Dressing procedure/placement/frequency:  Cleanse wounds B lower legs with NS, apply Aquacel AG (silver hydrofiber Gerlean #[id number]) cut to fit wound beds daily, cover with ABD pad or dry gauze and secure with Kerlix roll gauze beginning right above toes and ending right below knees.  May cover with Ace bandage wrapped in same fashion as Kerlix for light compression.   POC discussed with bedside nurse. WOC team will not follow. Patient would benefit from ongoing management of these wounds by wound care center or vascular if aggressive measures are desired. However appears to be followed by hospice at this time.   Thank you,    Powell Bar MSN, RN-BC, [employer]

## 2023-11-08 NOTE — Plan of Care (Signed)
 [name] is a 68 y.o. female patient. No diagnosis found. Past Medical History:  Diagnosis Date   A-fib (HCC)    Anemia    Anticoagulated on Coumadin , chronically 09/03/2011   Anxiety    Arthritis    right hip; both knees; left wrist/shoulder; back (01/19/2013   Bleeding on Coumadin  08/2012; 01/18/2013   [facility] admissions (01/19/2013)   CHF (congestive heart failure) (HCC)    2-3 times (01/19/2013)   Chronic lower back pain    Depression    DVT (deep venous thrombosis) (HCC) 10 years ago   numerous/notes 01/18/2013   GERD (gastroesophageal reflux disease)    Gout    Headache(784.0)    maybe weekly (01/19/2013)   Heart murmur    High cholesterol    been off RX for this at one time (01/18/2013)   History of blood transfusion 1983; 04/2012   3 w/ childbirth; hospitalized for pain (01/19/2013)   Hypertension    Hypothyroidism    Migraines    twice/yr maybe (01/19/2013)   Obstructive sleep apnea 05/03/2012   OSA (obstructive sleep apnea)    sent me for test in 04/2012; never ordered mask, etc (01/19/2013)   PE (pulmonary thromboembolism) (HCC) 3 years ag0   3/notes 01/18/2013   Pneumonia before 2011   once' (01/18/2013)   Renal disorder    kindey function low; Metformin was destroying my kidneys (01/19/2013)   Shortness of breath    only related to my CHF (01/18/2013)   Swelling of hand 08/31/2014   RT HAND   Type II diabetes mellitus (HCC)    UTI (urinary tract infection) 08/31/2014   Current Facility-Administered Medications  Medication Dose Route Frequency Provider Last Rate Last Admin   acetaminophen  (TYLENOL ) tablet 650 mg  650 mg Oral Q6H PRN Mannie Jerel PARAS, NP   650 mg at 11/05/23 2158   allopurinol  (ZYLOPRIM ) tablet 50 mg  50 mg Oral QODAY Stevens, Terry J, NP   50 mg at 11/07/23 1109   alum & mag hydroxide-simeth (MAALOX/MYLANTA) 200-200-20 MG/5ML suspension 30 mL  30 mL Oral Q4H PRN Mannie Jerel PARAS, NP       amLODipine  (NORVASC ) tablet  10 mg  10 mg Oral Daily Mannie Jerel PARAS, NP       apixaban  (ELIQUIS ) tablet 5 mg  5 mg Oral BID Mannie Jerel PARAS, NP   5 mg at 11/07/23 2256   citalopram  (CELEXA ) tablet 40 mg  40 mg Oral QHS Mannie Jerel PARAS, NP   40 mg at 11/07/23 2256   diazepam  (VALIUM ) tablet 5 mg  5 mg Oral BID PRN Mannie Jerel PARAS, NP       ferrous sulfate  tablet 325 mg  325 mg Oral Daily Mannie Jerel PARAS, NP   325 mg at 11/07/23 1109   guaiFENesin -dextromethorphan  (ROBITUSSIN DM) 100-10 MG/5ML syrup 5 mL  5 mL Oral Q8H PRN Mannie Jerel PARAS, NP       insulin  aspart (novoLOG ) injection 0-15 Units  0-15 Units Subcutaneous TID WC Mannie Jerel PARAS, NP   2 Units at 11/07/23 1708   insulin  aspart (novoLOG ) injection 0-5 Units  0-5 Units Subcutaneous QHS Mannie Jerel PARAS, NP       levothyroxine  (SYNTHROID ) tablet 75 mcg  75 mcg Oral QAC breakfast Mannie Jerel PARAS, NP   75 mcg at 11/07/23 9356   magnesium  hydroxide (MILK OF MAGNESIA) suspension 30 mL  30 mL Oral Daily PRN Mannie Jerel PARAS, NP       melatonin  tablet 10 mg  10 mg Oral QHS Mannie Jerel PARAS, NP   10 mg at 11/07/23 2256   multivitamin with minerals tablet 1 tablet  1 tablet Oral Daily Mannie Jerel PARAS, NP   1 tablet at 11/07/23 1109   neomycin -bacitracin -polymyxin [id number] OINT 1 Application  1 Application Apply externally BID PRN Shrivastava, Aryendra, MD   1 Application at 11/07/23 1629   nystatin  (MYCOSTATIN /NYSTOP ) topical powder   Topical TID Jadapalle, Sree, MD   Given at 11/07/23 2254   OLANZapine  (ZYPREXA ) injection 5 mg  5 mg Intramuscular TID PRN Mannie Jerel PARAS, NP       OLANZapine  zydis (ZYPREXA ) disintegrating tablet 5 mg  5 mg Oral TID PRN Mannie Jerel PARAS, NP       oxyCODONE  (Oxy IR/ROXICODONE ) immediate release tablet 5 mg  5 mg Oral Q6H PRN Mannie Jerel PARAS, NP       rosuvastatin  (CRESTOR ) tablet 10 mg  10 mg Oral Daily Mannie Jerel PARAS, NP   10 mg at 11/07/23 1109   torsemide  (DEMADEX ) tablet 40 mg  40 mg Oral BID Mannie Jerel PARAS, NP   40 mg at  11/07/23 1707   Allergies  Allergen Reactions   Ms Contin [Morphine] Hives, Rash and Other (See Comments)    Broke out in brown spots all over.    Principal Problem:   MDD (major depressive disorder), recurrent severe, without psychosis (HCC)  Blood pressure (!) 134/50, pulse (!) 50, temperature 98.5 F (36.9 C), resp. rate 18, height 5' 1 (1.549 m), weight 136.1 kg, SpO2 100%.  Subjective Objective Assessment & Plan  Pookela Sellin B Vonette Grosso 11/08/2023

## 2023-11-08 NOTE — [facility] Suicide Risk Assessment (Signed)
 [facility] INPATIENT:  Family/Significant Other Suicide Prevention Education  Suicide Prevention Education:  Contact Attempts: Alm Bucks, brother, [phone] and Niels Barge, sister, [phone],  has been identified by the patient as the family member/significant other with whom the patient will be residing, and identified as the person(s) who will aid the patient in the event of a mental health crisis.  With written consent from the patient, two attempts were made to provide suicide prevention education, prior to and/or following the patient's discharge.  We were unsuccessful in providing suicide prevention education.  A suicide education pamphlet was given to the patient to share with family/significant other.  Date and time of first attempt:11/07/23 at 3:58 pm  Date and time of second attempt: 11/08/23 at 10:34 am  Roselyn GORMAN Lento 11/08/2023, 10:34 AM

## 2023-11-08 NOTE — Progress Notes (Signed)
 [name] is a 68 y.o. female patient. No diagnosis found. Past Medical History:  Diagnosis Date   A-fib (HCC)    Anemia    Anticoagulated on Coumadin , chronically 09/03/2011   Anxiety    Arthritis    right hip; both knees; left wrist/shoulder; back (01/19/2013   Bleeding on Coumadin  08/2012; 01/18/2013   [facility] admissions (01/19/2013)   CHF (congestive heart failure) (HCC)    2-3 times (01/19/2013)   Chronic lower back pain    Depression    DVT (deep venous thrombosis) (HCC) 10 years ago   numerous/notes 01/18/2013   GERD (gastroesophageal reflux disease)    Gout    Headache(784.0)    maybe weekly (01/19/2013)   Heart murmur    High cholesterol    been off RX for this at one time (01/18/2013)   History of blood transfusion 1983; 04/2012   3 w/ childbirth; hospitalized for pain (01/19/2013)   Hypertension    Hypothyroidism    Migraines    twice/yr maybe (01/19/2013)   Obstructive sleep apnea 05/03/2012   OSA (obstructive sleep apnea)    sent me for test in 04/2012; never ordered mask, etc (01/19/2013)   PE (pulmonary thromboembolism) (HCC) 3 years ag0   3/notes 01/18/2013   Pneumonia before 2011   once' (01/18/2013)   Renal disorder    kindey function low; Metformin was destroying my kidneys (01/19/2013)   Shortness of breath    only related to my CHF (01/18/2013)   Swelling of hand 08/31/2014   RT HAND   Type II diabetes mellitus (HCC)    UTI (urinary tract infection) 08/31/2014   Current Facility-Administered Medications  Medication Dose Route Frequency Provider Last Rate Last Admin   acetaminophen  (TYLENOL ) tablet 650 mg  650 mg Oral Q6H PRN Mannie Jerel PARAS, NP   650 mg at 11/05/23 2158   allopurinol  (ZYLOPRIM ) tablet 50 mg  50 mg Oral QODAY Stevens, Terry J, NP   50 mg at 11/07/23 1109   alum & mag hydroxide-simeth (MAALOX/MYLANTA) 200-200-20 MG/5ML suspension 30 mL  30 mL Oral Q4H PRN Mannie Jerel PARAS, NP       amLODipine  (NORVASC ) tablet  10 mg  10 mg Oral Daily Mannie Jerel PARAS, NP       apixaban  (ELIQUIS ) tablet 5 mg  5 mg Oral BID Mannie Jerel PARAS, NP   5 mg at 11/07/23 2256   citalopram  (CELEXA ) tablet 40 mg  40 mg Oral QHS Mannie Jerel PARAS, NP   40 mg at 11/07/23 2256   diazepam  (VALIUM ) tablet 5 mg  5 mg Oral BID PRN Mannie Jerel PARAS, NP       ferrous sulfate  tablet 325 mg  325 mg Oral Daily Mannie Jerel PARAS, NP   325 mg at 11/07/23 1109   guaiFENesin -dextromethorphan  (ROBITUSSIN DM) 100-10 MG/5ML syrup 5 mL  5 mL Oral Q8H PRN Mannie Jerel PARAS, NP       insulin  aspart (novoLOG ) injection 0-15 Units  0-15 Units Subcutaneous TID WC Mannie Jerel PARAS, NP   2 Units at 11/07/23 1708   insulin  aspart (novoLOG ) injection 0-5 Units  0-5 Units Subcutaneous QHS Mannie Jerel PARAS, NP       levothyroxine  (SYNTHROID ) tablet 75 mcg  75 mcg Oral QAC breakfast Mannie Jerel PARAS, NP   75 mcg at 11/07/23 9356   magnesium  hydroxide (MILK OF MAGNESIA) suspension 30 mL  30 mL Oral Daily PRN Mannie Jerel PARAS, NP       melatonin  tablet 10 mg  10 mg Oral QHS Mannie Jerel PARAS, NP   10 mg at 11/07/23 2256   multivitamin with minerals tablet 1 tablet  1 tablet Oral Daily Mannie Jerel PARAS, NP   1 tablet at 11/07/23 1109   neomycin -bacitracin -polymyxin [id number] OINT 1 Application  1 Application Apply externally BID PRN Shrivastava, Aryendra, MD   1 Application at 11/07/23 1629   nystatin  (MYCOSTATIN /NYSTOP ) topical powder   Topical TID Jadapalle, Sree, MD   Given at 11/07/23 2254   OLANZapine  (ZYPREXA ) injection 5 mg  5 mg Intramuscular TID PRN Mannie Jerel PARAS, NP       OLANZapine  zydis (ZYPREXA ) disintegrating tablet 5 mg  5 mg Oral TID PRN Mannie Jerel PARAS, NP       oxyCODONE  (Oxy IR/ROXICODONE ) immediate release tablet 5 mg  5 mg Oral Q6H PRN Mannie Jerel PARAS, NP       rosuvastatin  (CRESTOR ) tablet 10 mg  10 mg Oral Daily Mannie Jerel PARAS, NP   10 mg at 11/07/23 1109   torsemide  (DEMADEX ) tablet 40 mg  40 mg Oral BID Mannie Jerel PARAS, NP   40 mg at  11/07/23 1707   Allergies  Allergen Reactions   Ms Contin [Morphine] Hives, Rash and Other (See Comments)    Broke out in brown spots all over.    Principal Problem:   MDD (major depressive disorder), recurrent severe, without psychosis (HCC)  Blood pressure (!) 134/50, pulse (!) 50, temperature 98.5 F (36.9 C), resp. rate 18, height 5' 1 (1.549 m), weight 136.1 kg, SpO2 100%.  Subjective Objective: Vital signs: (most recent): Blood pressure (!) 134/50, pulse (!) 50, temperature 98.5 F (36.9 C), resp. rate 18, height 5' 1 (1.549 m), weight 136.1 kg, SpO2 100%.    Assessment & Plan.     Cortnee Steinmiller B Micaylah Bertucci 11/08/2023

## 2023-11-08 NOTE — Progress Notes (Signed)
 While giving the patient a shower, patient's left leg wound began to spurt blood like arteriole bleeding.  While still in the shower this nurse attempted to control the bleeding applying a pressure dressing with abd and kerlix.  Patient was taken from the shower to the bed with the wound noticeably bleeding through the dressing.  Dr. Ruther notified. Unable to place the consult he was requesting.  Call a rapid response after speaking to [facility].  Wound redressed - labs ordered and dr. Marea from surgery notified.  BP 136-36 HR 51.  PA Matthew aware.

## 2023-11-08 NOTE — Progress Notes (Addendum)
  Chaplain On-Call responded to Rapid Response notification at 1613 on 11/08/2023 in room 27 of the [facility].  Nursing Staff informed Chaplain that the patient has experienced sudden bleeding from a chronic leg wound, and was treated by the medical team.  Chaplain was able to visit with the patient, who reported that she had attempted to end her life by overdosing on medication. The patient stated that she felt hopeless because she was not cared about by family, neighbors, nor her church. She stated that she felt her life has no purpose or meaning.  Chaplain provided much spiritual and emotional support and prayer, which the patient welcomed.  Chaplain Bebe Ardean EMERSON Hershal., [facility]

## 2023-11-08 NOTE — Progress Notes (Signed)
 Responded to rapid response called by nursing.I am unfamiliar with this pt they are followed today by Dr. Ruther.  Patient was found laying in the bed. They were alert and oriented.  Nursing was holding bandage on her leg.  Per report patient was in the shower and started having bleeding per the left lower extremity wound, pt on Eliquis .  Nursing had concern that it was arterial bleeding given the spreading pattern they added compression and bandage to the wound.  At this time the bleeding is controlled.  Request nursing repeat vitals.  CBC ordered.  A consult was placed to vascular surgery and I spoke with Dr. Marea over the phone and he has agreed to come see the patient today.  I contacted the psychiatric attending Dr. Ruther and left a message for the oncoming psychiatric attending Dr. Jadapalle as well. Nursing was instructed to keep dressing on the wound and to keep the extremity elevated.

## 2023-11-08 NOTE — Group Note (Signed)
 [facility] LCSW Group Therapy Note   Group Date: 11/08/2023 Start Time: 1300 End Time: 1400   Type of Therapy/Topic:  Group Therapy:  Emotion Regulation  Participation Level:  Did Not Attend   Mood:  Description of Group:    The purpose of this group is to assist patients in learning to regulate negative emotions and experience positive emotions. Patients will be guided to discuss ways in which they have been vulnerable to their negative emotions. These vulnerabilities will be juxtaposed with experiences of positive emotions or situations, and patients challenged to use positive emotions to combat negative ones. Special emphasis will be placed on coping with negative emotions in conflict situations, and patients will process healthy conflict resolution skills.  Therapeutic Goals: Patient will identify two positive emotions or experiences to reflect on in order to balance out negative emotions:  Patient will label two or more emotions that they find the most difficult to experience:  Patient will be able to demonstrate positive conflict resolution skills through discussion or role plays:   Summary of Patient Progress:   X    Therapeutic Modalities:   Cognitive Behavioral Therapy Feelings Identification Dialectical Behavioral Therapy   Pamila Nine, LCSW

## 2023-11-08 NOTE — Progress Notes (Signed)
 Per wound care request - bilateral leg wraps removed and pictures taken and uploaded to media.  New wound care orders placed.  Currently patient is requesting we leave her legs unwrapped and open to the air.  Will be wrapped later by this nurse.

## 2023-11-08 NOTE — Plan of Care (Signed)
  Problem: Education: Goal: Ability to make informed decisions regarding treatment will improve Outcome: Progressing   Problem: Coping: Goal: Coping ability will improve Outcome: Progressing   Problem: Medication: Goal: Compliance with prescribed medication regimen will improve Outcome: Progressing   Problem: Self-Concept: Goal: Ability to disclose and discuss suicidal ideas will improve Outcome: Progressing   Problem: Clinical Measurements: Goal: Ability to maintain clinical measurements within normal limits will improve Outcome: Not Met (add Reason) Goal: Will remain free from infection Outcome: Progressing   Problem: Nutrition: Goal: Adequate nutrition will be maintained Outcome: Progressing   Problem: Coping: Goal: Level of anxiety will decrease Outcome: Progressing   Problem: Safety: Goal: Ability to remain free from injury will improve Outcome: Progressing

## 2023-11-08 NOTE — Group Note (Signed)
 Date:  11/08/2023 Time:  10:37 AM  Group Topic/Focus:  Overcoming Stress:   The focus of this group is to define stress and help patients assess their triggers.    Participation Level:  Did Not Attend   [name] 11/08/2023, 10:37 AM

## 2023-11-08 NOTE — Consult Note (Signed)
 [facility] V[facility] VEIN SPECIALISTS Vascular Consult Note  MRN : [id number]  [name] is a 68 y.o. ([birth year]) female who presents with chief complaint of No chief complaint on file. SABRA  History of Present Illness: I am asked to see the patient by Clorinda Right for bleeding from the left lower extremity.  The patient has a long history of venous stasis ulcerations for many years.  Her wounds were unwrapped and she got in the shower and bumped the area and it began having brisk bleeding.  Direct pressure was placed on it by the nurse.  The patient was then put back in bed and a new wrap was placed on her leg.  The bleeding is now controlled with direct pressure.  No syncope or severe hypotension.  She is awake, alert, and oriented.  She has had previous history of bleeding from this area as well as a longstanding history of venous stasis ulcerations.  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  She is on chronic anticoagulation in the form of Eliquis .  Current Facility-Administered Medications  Medication Dose Route Frequency Provider Last Rate Last Admin   acetaminophen  (TYLENOL ) tablet 650 mg  650 mg Oral Q6H PRN Mannie Jerel PARAS, NP   650 mg at 11/05/23 2158   allopurinol  (ZYLOPRIM ) tablet 50 mg  50 mg Oral QODAY Stevens, Terry J, NP   50 mg at 11/07/23 1109   alum & mag hydroxide-simeth (MAALOX/MYLANTA) 200-200-20 MG/5ML suspension 30 mL  30 mL Oral Q4H PRN Mannie Jerel PARAS, NP       amLODipine  (NORVASC ) tablet 10 mg  10 mg Oral Daily Mannie Jerel PARAS, NP   10 mg at 11/08/23 9094   apixaban  (ELIQUIS ) tablet 5 mg  5 mg Oral BID Mannie Jerel PARAS, NP   5 mg at 11/08/23 9094   citalopram  (CELEXA ) tablet 40 mg  40 mg Oral QHS Mannie Jerel PARAS, NP   40 mg at 11/07/23 2256   diazepam  (VALIUM ) tablet 5 mg  5 mg Oral BID PRN Mannie Jerel PARAS, NP       ferrous sulfate  tablet 325 mg  325 mg Oral Daily Mannie Jerel PARAS, NP   325 mg at 11/08/23 9090    guaiFENesin -dextromethorphan  (ROBITUSSIN DM) 100-10 MG/5ML syrup 5 mL  5 mL Oral Q8H PRN Mannie Jerel PARAS, NP       insulin  aspart (novoLOG ) injection 0-15 Units  0-15 Units Subcutaneous TID WC Mannie Jerel PARAS, NP   3 Units at 11/08/23 1646   insulin  aspart (novoLOG ) injection 0-5 Units  0-5 Units Subcutaneous QHS Mannie Jerel PARAS, NP       levothyroxine  (SYNTHROID ) tablet 75 mcg  75 mcg Oral QAC breakfast Mannie Jerel PARAS, NP   75 mcg at 11/08/23 9350   magnesium  hydroxide (MILK OF MAGNESIA) suspension 30 mL  30 mL Oral Daily PRN Mannie Jerel PARAS, NP       melatonin tablet 10 mg  10 mg Oral QHS Mannie Jerel PARAS, NP   10 mg at 11/07/23 2256   multivitamin with minerals tablet 1 tablet  1 tablet Oral Daily Mannie Jerel PARAS, NP   1 tablet at 11/08/23 9095   neomycin -bacitracin -polymyxin [phone] OINT 1 Application  1 Application Apply externally BID PRN Shrivastava, Aryendra, MD   1 Application at 11/08/23 0906   nystatin  (MYCOSTATIN /NYSTOP ) topical powder   Topical TID PRN Madaram, Kondal R, MD  OLANZapine  (ZYPREXA ) injection 5 mg  5 mg Intramuscular TID PRN Mannie Jerel PARAS, NP       OLANZapine  zydis (ZYPREXA ) disintegrating tablet 5 mg  5 mg Oral TID PRN Mannie Jerel PARAS, NP       oxyCODONE  (Oxy IR/ROXICODONE ) immediate release tablet 5 mg  5 mg Oral Q6H PRN Mannie Jerel PARAS, NP       rosuvastatin  (CRESTOR ) tablet 10 mg  10 mg Oral Daily Mannie Jerel PARAS, NP   10 mg at 11/08/23 9093   torsemide  (DEMADEX ) tablet 40 mg  40 mg Oral BID Mannie Jerel PARAS, NP   40 mg at 11/08/23 9095    Past Medical History:  Diagnosis Date   A-fib [facility])    Anemia    Anticoagulated on Coumadin , chronically 09/03/2011   Anxiety    Arthritis    right hip; both knees; left wrist/shoulder; back (01/19/2013   Bleeding on Coumadin  08/2012; 01/18/2013   [facility] admissions (01/19/2013)   CHF (congestive heart failure) (HCC)    2-3 times (01/19/2013)   Chronic lower back pain    Depression    DVT (deep  venous thrombosis) (HCC) 10 years ago   numerous/notes 01/18/2013   GERD (gastroesophageal reflux disease)    Gout    Headache(784.0)    maybe weekly (01/19/2013)   Heart murmur    High cholesterol    been off RX for this at one time (01/18/2013)   History of blood transfusion 1983; 04/2012   3 w/ childbirth; hospitalized for pain (01/19/2013)   Hypertension    Hypothyroidism    Migraines    twice/yr maybe (01/19/2013)   Obstructive sleep apnea 05/03/2012   OSA (obstructive sleep apnea)    sent me for test in 04/2012; never ordered mask, etc (01/19/2013)   PE (pulmonary thromboembolism) (HCC) 3 years ag0   3/notes 01/18/2013   Pneumonia before 2011   once' (01/18/2013)   Renal disorder    kindey function low; Metformin was destroying my kidneys (01/19/2013)   Shortness of breath    only related to my CHF (01/18/2013)   Swelling of hand 08/31/2014   RT HAND   Type II diabetes mellitus (HCC)    UTI (urinary tract infection) 08/31/2014    Past Surgical History:  Procedure Laterality Date   CARDIAC CATHETERIZATION N/A 03/13/2015   Procedure: Right/Left Heart Cath and Coronary Angiography;  Surgeon: Gordy Bergamo, MD;  [facility] INVASIVE CV LAB;  Service: Cardiovascular;  Laterality: N/A;   CATARACT EXTRACTION W/ INTRAOCULAR LENS  IMPLANT, BILATERAL Bilateral 2006-2011   CESAREAN SECTION  1983   CHOLECYSTECTOMY  ~ 2002   COLONOSCOPY N/A 01/21/2013   Procedure: COLONOSCOPY;  Surgeon: Belvie JONETTA Just, MD;  Location: [facility] ENDOSCOPY;  Service: Endoscopy;  Laterality: N/A;   EYE SURGERY Bilateral    multiple (01/18/2013)   PACEMAKER IMPLANT N/A 08/31/2018   Symptomatic bradycardia due to mobitz II second degree AV block, permanent afib/ atypical atrial flutter implanted by Dr Kelsie PRESCOTT PLANA REPAIR OF RETINAL DEATACHMENT Right    PARS PLANA VITRECTOMY Bilateral 2004-2006   several (01/18/2013)   REFRACTIVE SURGERY Bilateral    for stigmatism (01/18/2013)    REFRACTIVE SURGERY Left ~ 11/2012   to puff it up cause vision got hazy (01/18/2013)   RIGHT HEART CATH N/A 12/30/2018   Procedure: RIGHT HEART CATH;  Surgeon: Cherrie Toribio SAUNDERS, MD;  Location: [facility] INVASIVE CV LAB;  Service: Cardiovascular;  Laterality: N/A;   RIGHT/LEFT HEART CATH AND  CORONARY ANGIOGRAPHY N/A 09/07/2023   Procedure: RIGHT/LEFT HEART CATH AND CORONARY ANGIOGRAPHY;  Surgeon: Wendel Lurena POUR, MD;  Location: [facility] INVASIVE CV LAB;  Service: Cardiovascular;  Laterality: N/A;   VENA CAVA FILTER PLACEMENT  2011?     Social History   Tobacco Use   Smoking status: Former    Current packs/day: 0.00    Average packs/day: 0.1 packs/day for 30.0 years (1.5 ttl pk-yrs)    Types: Cigarettes    Start date: 66    Quit date: 49    Years since quitting: 35.6   Smokeless tobacco: Never   Tobacco comments:    01/19/2013 quit smoking cigarettes in the early '90's  Vaping Use   Vaping status: Never Used  Substance Use Topics   Alcohol use: Not Currently    Comment: rare now but never a heavy drinker   Drug use: No    Family History  Problem Relation Age of Onset   Cerebral aneurysm Mother    Hypertension Father    Cerebral aneurysm Maternal Grandfather    Cerebral aneurysm Maternal Aunt    Cancer Maternal Uncle     Allergies  Allergen Reactions   Ms Contin [Morphine] Hives, Rash and Other (See Comments)    Broke out in brown spots all over.      REVIEW OF SYSTEMS (Negative unless checked)  Constitutional: [] Weight loss  [] Fever  [] Chills Cardiac: [] Chest pain   [] Chest pressure   [] Palpitations   [] Shortness of breath when laying flat   [] Shortness of breath at rest   [] Shortness of breath with exertion. Vascular:  [] Pain in legs with walking   [] Pain in legs at rest   [] Pain in legs when laying flat   [] Claudication   [] Pain in feet when walking  [] Pain in feet at rest  [] Pain in feet when laying flat   [x] History of DVT   [] Phlebitis   [x] Swelling in legs    [x] Varicose veins   [x] Non-healing ulcers Pulmonary:   [] Uses home oxygen   [] Productive cough   [] Hemoptysis   [] Wheeze  [] COPD   [] Asthma Neurologic:  [] Dizziness  [] Blackouts   [] Seizures   [] History of stroke   [] History of TIA  [] Aphasia   [] Temporary blindness   [] Dysphagia   [] Weakness or numbness in arms   [] Weakness or numbness in legs Musculoskeletal:  [] Arthritis   [] Joint swelling   [] Joint pain   [] Low back pain Hematologic:  [] Easy bruising  [] Easy bleeding   [] Hypercoagulable state   [] Anemic  [] Hepatitis Gastrointestinal:  [] Blood in stool   [] Vomiting blood  [] Gastroesophageal reflux/heartburn   [] Difficulty swallowing. Genitourinary:  [] Chronic kidney disease   [] Difficult urination  [] Frequent urination  [] Burning with urination   [] Blood in urine Skin:  [] Rashes   [x] Ulcers   [x] Wounds Psychological:  [] History of anxiety   [x]  History of major depression.  Physical Examination  Vitals:   11/07/23 2007 11/07/23 2101 11/08/23 0717 11/08/23 1627  BP: (!) 142/37 (!) 134/50 (!) 145/40 (!) 136/36  Pulse: (!) 50 (!) 50 (!) 50 (!) 51  Resp:   18 15  Temp: 97.8 F (36.6 C) 98.5 F (36.9 C) (!) 97.5 F (36.4 C) (!) 97.1 F (36.2 C)  TempSrc:      SpO2: 100% 100% 98% 99%  Weight:      Height:       Body mass index is 56.68 kg/m. Gen:  WD/WN, NAD.  Morbidly obese. Head: [place]/AT, No temporalis wasting.  Ear/Nose/Throat: Hearing  grossly intact, nares w/o erythema or drainage, oropharynx w/o Erythema/Exudate Eyes: Sclera non-icteric, conjunctiva clear Neck: Trachea midline.  No JVD.  Pulmonary:  Good air movement, respirations not labored, equal bilaterally.  Cardiac: irregular, bradycardic Vascular:  Vessel Right Left  Radial Palpable Palpable   Musculoskeletal: M/S 5/5 throughout.  Extremities without ischemic changes.  No deformity or atrophy. No edema. Neurologic: Sensation grossly intact in extremities.  Symmetrical.  Speech is fluent. Motor exam as listed  above. Psychiatric: Judgment intact, Mood & affect appropriate for pt's clinical situation. Dermatologic: venous stasis wounds on both lower extremities currently wrapped.  No ongoing bleeding from left leg wound that was bleeding previously.  The bandages clean, dry, and intact.      CBC Lab Results  Component Value Date   WBC 9.2 11/02/2023   HGB 9.2 (L) 11/02/2023   HCT 27.0 (L) 11/02/2023   MCV 111.7 (H) 11/02/2023   PLT 176 11/02/2023    BMET    Component Value Date/Time   NA 140 11/02/2023 1239   NA 143 09/18/2023 1606   NA 143 11/16/2014 1140   K 4.2 11/02/2023 1239   K 3.7 11/16/2014 1140   CL 103 11/02/2023 1230   CO2 25 11/02/2023 1230   CO2 31 (H) 11/16/2014 1140   GLUCOSE 162 (H) 11/02/2023 1230   GLUCOSE 71 11/16/2014 1140   BUN 67 (H) 11/02/2023 1230   BUN 82 (HH) 09/18/2023 1606   BUN 31.1 (H) 11/16/2014 1140   CREATININE 2.28 (H) 11/02/2023 1230   CREATININE 1.3 (H) 11/16/2014 1140   CALCIUM  9.1 11/02/2023 1230   CALCIUM  9.4 11/16/2014 1140   GFRNONAA 23 (L) 11/02/2023 1230   GFRAA 41 (L) 10/05/2019 1252   Estimated Creatinine Clearance: 31.4 mL/min (A) (by C-G formula based on SCr of 2.28 mg/dL (H)).  COAG Lab Results  Component Value Date   INR 1.3 (H) 09/07/2023   INR 1.4 (H) 09/06/2023   INR 1.7 (H) 09/05/2023   PROTIME 22.8 (H) 03/27/2016   PROTIME 25.2 (H) 01/31/2016   PROTIME 22.8 (H) 01/08/2016    Radiology DG Chest Portable 1 View Result Date: 11/02/2023 CLINICAL DATA:  Concern for aspiration.  Overdose. EXAM: PORTABLE CHEST 1 VIEW COMPARISON:  10/04/2023. FINDINGS: Stable cardiomegaly with central pulmonary vascular congestion. Stable left chest wall single lead pacemaker. Aortic atherosclerosis. Mild bibasilar atelectasis. No confluent focal consolidation, sizeable pleural effusion, or pneumothorax. No acute osseous abnormality. IMPRESSION: 1. Mild bibasilar atelectasis. 2. Cardiomegaly with central pulmonary vascular congestion.  Electronically Signed   By: Harrietta Sherry M.D.   On: 11/02/2023 13:42      Assessment/Plan 1.  Venous stasis ulceration of the left lower extremity with bleeding.  This was controlled with direct pressure and bandage placement.  I would continue these therapies as well as elevating the leg is much as possible.  Venous stasis ulcerations generally respond to pressure and elevation.  This would not be arterial bleeding although the bleeding can be quite brisk due to the venous hypertension.  I would leave this bandage on for about 48 hours.  We could come back and change this and she may need a formal Unna wrap going forward.  It may also be worthwhile to consider outpatient follow-up for treatment of venous disease if she is so inclined.  Given her extensive medical issues and currently on hospice care, this could also be just managed with bandaging. 2.  History of DVT and pulmonary embolus.  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  Continue to elevate and compress as much as possible.  Weight loss would be of significant benefit in terms of getting the leg swelling and venous stasis improved as well. 3.  Major depression with suicidal ideation.  Poor support at home.  On the psychiatric [name].   Selinda Gu, MD  11/08/2023 4:53 PM    This note was created with Dragon medical transcription system.  Any error is purely unintentional

## 2023-11-09 DIAGNOSIS — F332 Major depressive disorder, recurrent severe without psychotic features: Secondary | ICD-10-CM | POA: Diagnosis not present

## 2023-11-09 LAB — GLUCOSE, CAPILLARY
Glucose-Capillary: 137 mg/dL — ABNORMAL HIGH (ref 70–99)
Glucose-Capillary: 162 mg/dL — ABNORMAL HIGH (ref 70–99)
Glucose-Capillary: 145 mg/dL — ABNORMAL HIGH (ref 70–99)
Glucose-Capillary: 137 mg/dL — ABNORMAL HIGH (ref 70–99)

## 2023-11-09 NOTE — Progress Notes (Addendum)
 Pt , pleasant and cooperative on approach, denied SI, plan or intent. Pt was OOB x1 for her [facility] with assistance. Pt continues to have a small amount of bleeding to left leg dressing remain intact per order, pt managed on q 15 min rounds.     11/08/23 2100  Psych Admission Type (Psych Patients Only)  Admission Status Voluntary  Psychosocial Assessment  Patient Complaints Other (Comment) (feeling tired)  Eye Contact Fair  Facial Expression Sad  Affect Appropriate to circumstance  Speech Soft  Interaction Minimal  Motor Activity Slow  Appearance/Hygiene In hospital gown  Behavior Characteristics Cooperative  Mood Depressed  Thought Process  Coherency WDL  Content WDL  Delusions None reported or observed  Perception WDL  Hallucination None reported or observed  Judgment Impaired  Confusion Mild  Danger to Self  Current suicidal ideation? Denies  Agreement Not to Harm Self Yes  Description of Agreement verbal  Danger to Others  Danger to Others None reported or observed

## 2023-11-09 NOTE — Group Note (Signed)
 Recreation Therapy Group Note   Group Topic:Leisure Education  Group Date: 11/09/2023 Start Time: 1500 End Time: 1600 Facilitators: Celestia Jeoffrey BRAVO, LRT, CTRS Location: Courtyard  Group Description: Music. Patients encouraged to name their favorite song(s) for LRT to play song through speaker for group to hear, while in the courtyard getting fresh air and sunlight. Patients educated on the definition of leisure and the importance of having different leisure interests outside of the hospital. Group discussed how leisure activities can often be used as Pharmacologist and that listening to music and being outside are examples.    Goal Area(s) Addressed:  Patient will identify a current leisure interest.  Patient will practice making a positive decision. Patient will have the opportunity to try a new leisure activity.   Affect/Mood: N/A   Participation Level: Did not attend    Clinical Observations/Individualized Feedback: Patient did not attend group.   Plan: Continue to engage patient in RT group sessions 2-3x/week.   Jeoffrey BRAVO Celestia, LRT, CTRS 11/09/2023 5:14 PM

## 2023-11-09 NOTE — Progress Notes (Signed)
 Occupational Therapy Treatment Patient Details Name: [name] MRN: [id number] DOB: September 28, 1955 Today's Date: 11/09/2023   History of present illness [name] is a 68 y.o. female admitted: Presented to the EDfor 11/02/2023 12:21 PM for intentional ingestion of oxycodone  pills in an attempt to harm self. She carries the psychiatric diagnoses of MDD and anxiety and has a past medical history of  hypertension, type 2 diabetes, atrial fibrillation, DVT/PE status post IVC filter, HFpEF, single-chamber PPM and chronic pain   OT comments  Pt is supine in bed on arrival. Pleasant and asking to use the bathroom on entry. LLE wound with controlled bleeding via bandaging and recommended to be elevated as much as possible. Per nurse limit time up on that leg, but okay for transfers this date. Pt has some BLE pain. She needed Min/Mod A for bed mobility via HHA for trunkal elevation, but able to return to supine with CGA. Pt required Min A for STS from lower bed height and Min/Mod A with increased time for SPT to [name]. Set up to min a for hair brushing seated on BSC. Max A for peri-care in standing with unilateral support on bed rail. Pt declined further activity this date. She fatigues easily, but sp02 maintained in 90's throughout session.  Pt returned to bed with all needs in place and will cont to require skilled acute OT services to maximize her safety and IND to return to PLOF.       If plan is discharge home, recommend the following:  A little help with walking and/or transfers;A lot of help with bathing/dressing/bathroom   Equipment Recommendations  None recommended by OT    Recommendations for Other Services      Precautions / Restrictions Precautions Precautions: Fall Recall of Precautions/Restrictions: Intact Restrictions Weight Bearing Restrictions Per Provider Order: No       Mobility Bed Mobility Overal bed mobility: Needs Assistance Bed Mobility: Supine to Sit, Sit to  Supine     Supine to sit: Min assist, Mod assist, HOB elevated Sit to supine: Contact guard assist   General bed mobility comments: Min/Mod A via HHA for trunkal elevation to reach EOB; able to return to supine with CGA    Transfers   Equipment used: Rolling walker (2 wheels) Transfers: Sit to/from Stand Sit to Stand: From elevated surface, Min assist, Mod assist           General transfer comment: from lower bed height surface and Min/CGA from BSC pushing up with both hands; Min/Mod A for safety with increased time with SPT bed<>BSC     Balance Overall balance assessment: Needs assistance Sitting-balance support: Single extremity supported Sitting balance-[name]: Good     Standing balance support: Single extremity supported, During functional activity Standing balance-[name]: Fair Standing balance comment: able to stand with support on bed during peri-care with no LOB                           ADL either performed or assessed with clinical judgement   ADL Overall ADL's : Needs assistance/impaired     Grooming: Brushing hair;Minimal assistance Grooming Details (indicate cue type and reason): Min A for matted hair in the back, set up for the front of her head                 Toilet Transfer: Moderate assistance;Minimal assistance;BSC/3in1;Stand-pivot   Toileting- Clothing Manipulation and Hygiene: Maximal assistance;Sit to/from stand Toileting -  Clothing Manipulation Details (indicate cue type and reason): peri-care in standing            Extremity/Trunk Assessment              Vision       Perception     Praxis     Communication Communication Communication: No apparent difficulties   Cognition Arousal: Alert Behavior During Therapy: WFL for tasks assessed/performed, Flat affect                                 Following commands: Intact        Cueing   Cueing Techniques: Verbal cues  Exercises       Shoulder Instructions       General Comments fatigues easily; on 2L with stable sp02    Pertinent Vitals/ Pain       Pain Assessment Pain Assessment: Faces Faces Pain Scale: Hurts little more Pain Location: BLEs Pain Descriptors / Indicators: Discomfort, Sore Pain Intervention(s): Limited activity within patient's tolerance, Monitored during session, Repositioned  Home Living                                          Prior Functioning/Environment              Frequency  Min 1X/week        Progress Toward Goals  OT Goals(current goals can now be found in the care plan section)  Progress towards OT goals: Progressing toward goals  Acute Rehab OT Goals Patient Stated Goal: go to LTC OT Goal Formulation: With patient Time For Goal Achievement: 12/04/23 Potential to Achieve Goals: Fair  Plan      Co-evaluation                 AM-PAC OT 6 Clicks Daily Activity     Outcome Measure   Help from another person eating meals?: None Help from another person taking care of personal grooming?: None Help from another person toileting, which includes using toliet, bedpan, or urinal?: A Little Help from another person bathing (including washing, rinsing, drying)?: A Lot Help from another person to put on and taking off regular upper body clothing?: A Little Help from another person to put on and taking off regular lower body clothing?: A Lot 6 Click Score: 18    End of Session Equipment Utilized During Treatment: Gait belt;Oxygen  OT Visit Diagnosis: Unsteadiness on feet (R26.81);Muscle weakness (generalized) (M62.81)   Activity Tolerance Patient tolerated treatment well   Patient Left in bed;with call bell/phone within reach   Nurse Communication Mobility status        Time: 8473-8447 OT Time Calculation (min): 26 min  Charges:    Debby Clyne, OTR/L  11/09/23, 5:01 PM   [name] 11/09/2023, 4:59 PM

## 2023-11-09 NOTE — Plan of Care (Signed)
  Problem: Education: Goal: Ability to make informed decisions regarding treatment will improve Outcome: Progressing   Problem: Coping: Goal: Coping ability will improve Outcome: Progressing   Problem: Health Behavior/Discharge Planning: Goal: Identification of resources available to assist in meeting health care needs will improve Outcome: Progressing   Problem: Medication: Goal: Compliance with prescribed medication regimen will improve 11/09/2023 0744 by Ann Orlean LABOR, RN Outcome: Progressing 11/08/2023 2157 by Ann Orlean LABOR, RN Outcome: Progressing

## 2023-11-09 NOTE — Progress Notes (Signed)
 This morning patient was able to get up to the bedside commode with 2 person assist.  Ate all her breakfast and her two water pitchers refilled. Was pleasant until she called around 9:30am for a diet coke and when informed this unit has rules of only with lunch and dinner she became angry.  Refused to take pills.  Claims she can only swallow pills with soda as water chokes her. Pointed out that yesterday she took all her pills with water with no problem.  Doctor made aware she is refusing oral meds.  Ate all her lunch. Meeting with OT currently.  Will continue to monitor.

## 2023-11-09 NOTE — Progress Notes (Signed)
 [facility] MD Progress Note  11/09/2023 3:27 PM [name]  MRN:  [id number]  [name] is a 68 y/o female admitted to the [facility] gero-psych unit. She has significant and complex PMH including MDD, anxiety, HTN, DM2, a-fib on Eliquis , DVT/PE with IVF filter, HFpEF with pulmonary HTN, CKD stage IV, chronic pain, and morbid obesity with a BMI of 56 on 2-3L of oxygen at baseline. She is DNR/DNI with limited additional interventions on hospice, also receiving palliative care. She initially presented to the ED on 11/02/23 following an intentional overdose on 40 oxycodone  pills (5 mg). She was found by her home nurse the next morning who called 911.   Subjective: She is reviewed and discussed the case with the treatment team.  Today on interview patient is noted to be very upset.  She remained focused on discharge for the first few minutes and was able to calm down.  She reports that she never had any problems in getting to take her medications with ginger ale or soda as she reports like nausea and feelings of throwing up when she takes medications with water.  She reports that today she was declined offer any soda or ginger ale with medications.  Patient reports that she will refuse to take medications until she was provided with ginger ale.  She is noted to be crying and sobbing stating that she will not be treated like a child and that she is a elderly person and she does not respect.  Emotional support was provided.  After patient calmed down she talked about her family members including sister and brother unable to take care of her.  Provider discussed nursing home options.  Patient is agreeable to work with the social work team on her transition of care.  Patient also acknowledged the bleeding episode she had from her legs yesterday and she is aware to keep her legs elevated and not put any weight on it. Principal Problem: MDD (major depressive disorder), recurrent severe, without psychosis  (HCC) Diagnosis: Principal Problem:   MDD (major depressive disorder), recurrent severe, without psychosis (HCC)   Past Psychiatric History: MDD and anxiety, compliant on medication   Information collected from the patient and chart review   Prev Dx/Sx: MDD and anxiety Current Psych Provider: None Home Meds (current): Celexa  40 mg daily and Valium  5 mg bid Previous Med Trials: No Therapy: Not currently   Prior Psych Hospitalization: Denies  Prior Self Harm: Denies Prior Violence: Denies   Family Psych History: Depression, some alcohol abuse Family Hx suicide: Great nephew committed suicide   Social History:  Educational Hx: High school graduate 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 Hx: Denies Living Situation: Lives alone in private residence Spiritual Hx: Christian Access to weapons/lethal means: Denies    Substance History Alcohol: Denies use History of alcohol withdrawal seizures: N/a History of DT's: N/a Tobacco: Former; quit in 1990 Illicit drugs: No Prescription drug abuse: No Rehab hx: No  Past Medical History:  Past Medical History:  Diagnosis Date   A-fib (HCC)    Anemia    Anticoagulated on Coumadin , chronically 09/03/2011   Anxiety    Arthritis    right hip; both knees; left wrist/shoulder; back (01/19/2013   Bleeding on Coumadin  08/2012; 01/18/2013   [facility] admissions (01/19/2013)   CHF (congestive heart failure) (HCC)    2-3 times (01/19/2013)   Chronic lower back pain    Depression    DVT (deep venous thrombosis) (HCC) 10 years ago  numerous/notes 01/18/2013   GERD (gastroesophageal reflux disease)    Gout    Headache(784.0)    maybe weekly (01/19/2013)   Heart murmur    High cholesterol    been off RX for this at one time (01/18/2013)   History of blood transfusion 1983; 04/2012   3 w/ childbirth; hospitalized for pain (01/19/2013)   Hypertension    Hypothyroidism    Migraines    twice/yr maybe (01/19/2013)    Obstructive sleep apnea 05/03/2012   OSA (obstructive sleep apnea)    sent me for test in 04/2012; never ordered mask, etc (01/19/2013)   PE (pulmonary thromboembolism) (HCC) 3 years ag0   3/notes 01/18/2013   Pneumonia before 2011   once' (01/18/2013)   Renal disorder    kindey function low; Metformin was destroying my kidneys (01/19/2013)   Shortness of breath    only related to my CHF (01/18/2013)   Swelling of hand 08/31/2014   RT HAND   Type II diabetes mellitus (HCC)    UTI (urinary tract infection) 08/31/2014    Past Surgical History:  Procedure Laterality Date   CARDIAC CATHETERIZATION N/A 03/13/2015   Procedure: Right/Left Heart Cath and Coronary Angiography;  Surgeon: Gordy Bergamo, MD;  Location: [facility] INVASIVE CV LAB;  Service: Cardiovascular;  Laterality: N/A;   CATARACT EXTRACTION W/ INTRAOCULAR LENS  IMPLANT, BILATERAL Bilateral 2006-2011   CESAREAN SECTION  1983   CHOLECYSTECTOMY  ~ 2002   COLONOSCOPY N/A 01/21/2013   Procedure: COLONOSCOPY;  Surgeon: Belvie JONETTA Just, MD;  Location: [facility] ENDOSCOPY;  Service: Endoscopy;  Laterality: N/A;   EYE SURGERY Bilateral    multiple (01/18/2013)   PACEMAKER IMPLANT N/A 08/31/2018   Symptomatic bradycardia due to mobitz II second degree AV block, permanent afib/ atypical atrial flutter implanted by Dr Kelsie PRESCOTT PLANA REPAIR OF RETINAL DEATACHMENT Right    PARS PLANA VITRECTOMY Bilateral 2004-2006   several (01/18/2013)   REFRACTIVE SURGERY Bilateral    for stigmatism (01/18/2013)   REFRACTIVE SURGERY Left ~ 11/2012   to puff it up cause vision got hazy (01/18/2013)   RIGHT HEART CATH N/A 12/30/2018   Procedure: RIGHT HEART CATH;  Surgeon: Cherrie Toribio SAUNDERS, MD;  Location: [facility] INVASIVE CV LAB;  Service: Cardiovascular;  Laterality: N/A;   RIGHT/LEFT HEART CATH AND CORONARY ANGIOGRAPHY N/A 09/07/2023   Procedure: RIGHT/LEFT HEART CATH AND CORONARY ANGIOGRAPHY;  Surgeon: Wendel Lurena POUR, MD;  Location: [facility] INVASIVE CV LAB;   Service: Cardiovascular;  Laterality: N/A;   VENA CAVA FILTER PLACEMENT  2011?   Family History:  Family History  Problem Relation Age of Onset   Cerebral aneurysm Mother    Hypertension Father    Cerebral aneurysm Maternal Grandfather    Cerebral aneurysm Maternal Aunt    Cancer Maternal Uncle     Social History:  Social History   Substance and Sexual Activity  Alcohol Use Not Currently   Comment: rare now but never a heavy drinker     Social History   Substance and Sexual Activity  Drug Use No    Social History   Socioeconomic History   Marital status: Single    Spouse name: Not on file   Number of children: Not on file   Years of education: Not on file   Highest education level: Not on file  Occupational History   Occupation: disabled  Tobacco Use   Smoking status: Former    Current packs/day: 0.00    Average packs/day: 0.1 packs/day for  30.0 years (1.5 ttl pk-yrs)    Types: Cigarettes    Start date: 6    Quit date: 8    Years since quitting: 35.6   Smokeless tobacco: Never   Tobacco comments:    01/19/2013 quit smoking cigarettes in the early '90's  Vaping Use   Vaping status: Never Used  Substance and Sexual Activity   Alcohol use: Not Currently    Comment: rare now but never a heavy drinker   Drug use: No   Sexual activity: Never  Other Topics Concern   Not on file  Social History Narrative   Not on file   Social Drivers of Health   Financial Resource Strain: Not on file  Food Insecurity: Patient Declined (11/05/2023)   Hunger Vital Sign    Worried About Running Out of Food in the Last Year: Patient declined    Ran Out of Food in the Last Year: Patient declined  Transportation Needs: Patient Declined (11/05/2023)   PRAPARE - Administrator, Civil Service (Medical): Patient declined    Lack of Transportation (Non-Medical): Patient declined  Physical Activity: Not on file  Stress: Not on file  Social Connections: Socially  Isolated (11/05/2023)   Social Connection and Isolation Panel    Frequency of Communication with Friends and Family: More than three times a week    Frequency of Social Gatherings with Friends and Family: Once a week    Attends Religious Services: Never    Database administrator or Organizations: No    Attends Engineer, structural: Never    Marital Status: Divorced   Additional Social History:                         Sleep: Fair Estimated Sleeping Duration (Last 24 Hours): 9.25-11.00 hours  Appetite:  Good  Current Medications: Current Facility-Administered Medications  Medication Dose Route Frequency Provider Last Rate Last Admin   acetaminophen  (TYLENOL ) tablet 650 mg  650 mg Oral Q6H PRN Mannie Jerel PARAS, NP   650 mg at 11/05/23 2158   allopurinol  (ZYLOPRIM ) tablet 50 mg  50 mg Oral QODAY Stevens, Terry J, NP   50 mg at 11/07/23 1109   alum & mag hydroxide-simeth (MAALOX/MYLANTA) 200-200-20 MG/5ML suspension 30 mL  30 mL Oral Q4H PRN Mannie Jerel PARAS, NP       amLODipine  (NORVASC ) tablet 10 mg  10 mg Oral Daily Mannie Jerel PARAS, NP   10 mg at 11/08/23 9094   apixaban  (ELIQUIS ) tablet 5 mg  5 mg Oral BID Mannie Jerel PARAS, NP   5 mg at 11/08/23 2051   citalopram  (CELEXA ) tablet 40 mg  40 mg Oral QHS Mannie Jerel PARAS, NP   40 mg at 11/08/23 2051   diazepam  (VALIUM ) tablet 5 mg  5 mg Oral BID PRN Mannie Jerel PARAS, NP       ferrous sulfate  tablet 325 mg  325 mg Oral Daily Mannie Jerel PARAS, NP   325 mg at 11/08/23 9090   guaiFENesin -dextromethorphan  (ROBITUSSIN DM) 100-10 MG/5ML syrup 5 mL  5 mL Oral Q8H PRN Mannie Jerel PARAS, NP       insulin  aspart (novoLOG ) injection 0-15 Units  0-15 Units Subcutaneous TID WC Mannie Jerel PARAS, NP   2 Units at 11/09/23 1212   insulin  aspart (novoLOG ) injection 0-5 Units  0-5 Units Subcutaneous QHS Mannie Jerel PARAS, NP       levothyroxine  (SYNTHROID ) tablet 75 mcg  75 mcg Oral QAC breakfast Mannie Jerel PARAS, NP   75 mcg at 11/08/23 9350    magnesium  hydroxide (MILK OF MAGNESIA) suspension 30 mL  30 mL Oral Daily PRN Mannie Jerel PARAS, NP       melatonin tablet 10 mg  10 mg Oral QHS Mannie Jerel PARAS, NP   10 mg at 11/08/23 2051   multivitamin with minerals tablet 1 tablet  1 tablet Oral Daily Mannie Jerel PARAS, NP   1 tablet at 11/08/23 9095   neomycin -bacitracin -polymyxin [phone] OINT 1 Application  1 Application Apply externally BID PRN Shrivastava, Aryendra, MD   1 Application at 11/08/23 9093   nystatin  (MYCOSTATIN /NYSTOP ) topical powder   Topical TID PRN Madaram, Kondal R, MD       OLANZapine  (ZYPREXA ) injection 5 mg  5 mg Intramuscular TID PRN Mannie Jerel PARAS, NP       OLANZapine  zydis (ZYPREXA ) disintegrating tablet 5 mg  5 mg Oral TID PRN Mannie Jerel PARAS, NP       oxyCODONE  (Oxy IR/ROXICODONE ) immediate release tablet 5 mg  5 mg Oral Q6H PRN Mannie Jerel PARAS, NP       rosuvastatin  (CRESTOR ) tablet 10 mg  10 mg Oral Daily Mannie Jerel PARAS, NP   10 mg at 11/08/23 9093   torsemide  (DEMADEX ) tablet 40 mg  40 mg Oral BID Mannie Jerel PARAS, NP   40 mg at 11/08/23 1750    Lab Results:  Results for orders placed or performed during the hospital encounter of 11/05/23 (from the past 48 hours)  Glucose, capillary     Status: Abnormal   Collection Time: 11/07/23  4:44 PM  Result Value Ref Range   Glucose-Capillary 144 (H) 70 - 99 mg/dL    Comment: Glucose reference range applies only to samples taken after fasting for at least 8 hours.  Glucose, capillary     Status: Abnormal   Collection Time: 11/07/23  8:28 PM  Result Value Ref Range   Glucose-Capillary 179 (H) 70 - 99 mg/dL    Comment: Glucose reference range applies only to samples taken after fasting for at least 8 hours.  Glucose, capillary     Status: Abnormal   Collection Time: 11/08/23  7:39 AM  Result Value Ref Range   Glucose-Capillary 144 (H) 70 - 99 mg/dL    Comment: Glucose reference range applies only to samples taken after fasting for at least 8 hours.   Glucose, capillary     Status: Abnormal   Collection Time: 11/08/23 11:33 AM  Result Value Ref Range   Glucose-Capillary 173 (H) 70 - 99 mg/dL    Comment: Glucose reference range applies only to samples taken after fasting for at least 8 hours.  Glucose, capillary     Status: Abnormal   Collection Time: 11/08/23  4:32 PM  Result Value Ref Range   Glucose-Capillary 181 (H) 70 - 99 mg/dL    Comment: Glucose reference range applies only to samples taken after fasting for at least 8 hours.  CBC with Differential/Platelet     Status: Abnormal   Collection Time: 11/08/23  4:53 PM  Result Value Ref Range   WBC 7.9 4.0 - 10.5 K/uL   RBC 2.78 (L) 3.87 - 5.11 MIL/uL   Hemoglobin 9.0 (L) 12.0 - 15.0 g/dL   HCT 69.9 (L) 63.9 - 53.9 %   MCV 107.9 (H) 80.0 - 100.0 fL   MCH 32.4 26.0 - 34.0 pg   MCHC 30.0 30.0 - 36.0 g/dL  RDW 16.3 (H) 11.5 - 15.5 %   Platelets 142 (L) 150 - 400 K/uL   nRBC 0.0 0.0 - 0.2 %   Neutrophils Relative % 86 %   Neutro Abs 6.8 1.7 - 7.7 K/uL   Lymphocytes Relative 5 %   Lymphs Abs 0.4 (L) 0.7 - 4.0 K/uL   Monocytes Relative 6 %   Monocytes Absolute 0.5 0.1 - 1.0 K/uL   Eosinophils Relative 2 %   Eosinophils Absolute 0.2 0.0 - 0.5 K/uL   Basophils Relative 0 %   Basophils Absolute 0.0 0.0 - 0.1 K/uL   Immature Granulocytes 1 %   Abs Immature Granulocytes 0.04 0.00 - 0.07 K/uL    Comment: Performed at [facility], [address] [place], [place] [address]  Glucose, capillary     Status: Abnormal   Collection Time: 11/08/23  7:14 PM  Result Value Ref Range   Glucose-Capillary 191 (H) 70 - 99 mg/dL    Comment: Glucose reference range applies only to samples taken after fasting for at least 8 hours.  Glucose, capillary     Status: Abnormal   Collection Time: 11/09/23  7:30 AM  Result Value Ref Range   Glucose-Capillary 137 (H) 70 - 99 mg/dL    Comment: Glucose reference range applies only to samples taken after fasting for at least 8 hours.  Glucose,  capillary     Status: Abnormal   Collection Time: 11/09/23 11:56 AM  Result Value Ref Range   Glucose-Capillary 162 (H) 70 - 99 mg/dL    Comment: Glucose reference range applies only to samples taken after fasting for at least 8 hours.    Blood Alcohol level:  Lab Results  Component Value Date   [facility] <15 11/02/2023    Metabolic Disorder Labs: Lab Results  Component Value Date   HGBA1C 5.7 (H) 09/04/2023   MPG 116.89 09/04/2023   MPG 125.5 09/09/2021   No results found for: PROLACTIN Lab Results  Component Value Date   CHOL 105 09/03/2023   TRIG 56 09/03/2023   HDL 50 09/03/2023   CHOLHDL 2.1 09/03/2023   VLDL 11 09/03/2023   LDLCALC 44 09/03/2023   LDLCALC 48 08/30/2018    Physical Findings: AIMS:  ,  ,  ,  ,  ,  ,   CIWA:    COWS:     Musculoskeletal: Strength & Muscle Tone: within normal limits Gait & Station: unable to stand Patient leans: N/A  Psychiatric Specialty Exam:  Presentation      Patient is a 68 year old female who appears stated age alert oriented x3 mood is okay affect is constricted lying in the bed thought processes logical and coherent denies any suicidal homicidal thoughts no perceptual disturbances  insight and judgment at baseline    Physical Exam: Physical Exam Vitals and nursing note reviewed.    ROS Blood pressure (!) 149/47, pulse (!) 50, temperature (!) 97.5 F (36.4 C), resp. rate 15, height 5' 1 (1.549 m), weight 136.1 kg, SpO2 100%. Body mass index is 56.68 kg/m.   Treatment Plan Summary: Principal Diagnosis: MDD (major depressive disorder), recurrent severe, without psychosis (HCC) Diagnosis:  Principal Problem:   MDD (major depressive disorder), recurrent severe, without psychosis (HCC)     Clinical Decision Making: Patient is exhibiting s/sx consistent with MDD, severe in nature due to her suicide attempt via OD. She is medically complex and requires a high level of care. Due to her active and ongoing SI, she  requires continued  inpatient observation, safety monitoring, and medication management.    Treatment Plan Summary:   Safety and Monitoring:             -- Voluntary admission to inpatient psychiatric unit for safety, stabilization and treatment             -- Daily contact with patient to assess and evaluate symptoms and progress in treatment             -- Patient's case to be discussed in multi-disciplinary team meeting             -- Observation Level: q15 minute checks             -- Vital signs:  q12 hours             -- Precautions: suicide, elopement, and assault   2. Psychiatric Diagnoses and Treatment:               Major depressive disorder, severe, without psychotic features  -Continue home Celexa  40 mg PO once daily and Valium  5 mg PO bid  -Focus on improving coping skills  -Referral for outpatient psychiatrist upon discharge  -Will reach out to the patient's brother and sister   -- The risks/benefits/side-effects/alternatives to this medication were discussed in detail with the patient and time was given for questions. The patient consents to medication trial.                -- Metabolic profile and EKG monitoring obtained while on an atypical antipsychotic (BMI: Lipid Panel: HbgA1c: QTc:)              -- Encouraged patient to participate in unit milieu and in scheduled group therapies                            3. Medical Issues Being Addressed:  Multiple complex medical issues including worsening diastolic heart failure and pulmonary HTN on baseline oxygen, CKD stage IV, morbid obesity and limited ability to ambulate. UTI noted on UA in the ED.   -UTI being treated with Keflex  500 mg PO q12h for 5 days -Has been bradycardic (HR in the low 50's) x3, hypotensive with diastolic in the 50's. Will withhold home meds as indicated for these problems.  -Continue home medications, holding antihypertensives when diastolic BP is <60 mmHg. -Consult wound care for bilateral lower leg  wounds -Maintain SpO2 > 95%, currently on 3L [place]      4. Discharge Planning:              -- Social work and case management to assist with discharge planning and identification of hospital follow-up needs prior to discharge             -- Estimated LOS: 5-7 days             -- Discharge Concerns: Need to establish a safety plan; Medication compliance and effectiveness             -- Discharge Goals: Return home with outpatient referrals follow ups   Physician Treatment Plan for Primary Diagnosis: MDD (major depressive disorder), recurrent severe, without psychosis (HCC) Long Term Goal(s): Improvement in symptoms so as ready for discharge   Short Term Goals: Ability to identify changes in lifestyle to reduce recurrence of condition will improve, Ability to verbalize feelings will improve, Ability to disclose and discuss suicidal ideas, Ability to demonstrate self-control will improve, Ability to identify  and develop effective coping behaviors will improve, and Ability to identify triggers associated with substance abuse/mental health issues will improve   Physician Treatment Plan for Secondary Diagnosis: Principal Problem:   MDD (major depressive disorder), recurrent severe, without psychosis (HCC)   Long Term Goal(s): Improvement in symptoms so as ready for discharge   Short Term Goals: Ability to identify changes in lifestyle to reduce recurrence of condition will improve, Ability to disclose and discuss suicidal ideas, Ability to demonstrate self-control will improve, Ability to identify and develop effective coping behaviors will improve, and Ability to identify triggers associated with substance abuse/mental health issues will improve    Allyn Foil, MD 11/09/2023, 3:27 PM

## 2023-11-09 NOTE — Group Note (Signed)
 Date:  11/09/2023 Time:  10:43 PM  Group Topic/Focus:  Self Care:   The focus of this group is to help patients understand the importance of self-care in order to improve or restore emotional, physical, spiritual, interpersonal, and financial health.    Participation Level:  Did Not Attend  Participation Quality:  Did Not Attend  Affect:  Did Not Attend  Cognitive:  Did Not Attend  Insight: None  Engagement in Group:  None  Modes of Intervention:  Did Not Attend  Additional Comments:    Laymon ONEIDA Finder 11/09/2023, 10:43 PM

## 2023-11-09 NOTE — Group Note (Signed)
 Date:  11/09/2023 Time:  2:25 PM  Group Topic/Focus:  Making Healthy Choices:   The focus of this group is to help patients identify negative/unhealthy choices they were using prior to admission and identify positive/healthier coping strategies to replace them upon discharge.    Participation Level:  Did Not Attend  Participation Quality:    Affect:    Cognitive:    Insight:   Engagement in Group:    Modes of Intervention:    Additional Comments:    [name] 11/09/2023, 2:25 PM

## 2023-11-10 DIAGNOSIS — L97928 Non-pressure chronic ulcer of unspecified part of left lower leg with other specified severity: Secondary | ICD-10-CM

## 2023-11-10 DIAGNOSIS — I872 Venous insufficiency (chronic) (peripheral): Secondary | ICD-10-CM

## 2023-11-10 DIAGNOSIS — L97901 Non-pressure chronic ulcer of unspecified part of unspecified lower leg limited to breakdown of skin: Secondary | ICD-10-CM

## 2023-11-10 LAB — GLUCOSE, CAPILLARY
Glucose-Capillary: 143 mg/dL — ABNORMAL HIGH (ref 70–99)
Glucose-Capillary: 151 mg/dL — ABNORMAL HIGH (ref 70–99)
Glucose-Capillary: 140 mg/dL — ABNORMAL HIGH (ref 70–99)
Glucose-Capillary: 142 mg/dL — ABNORMAL HIGH (ref 70–99)

## 2023-11-10 MED ORDER — ARIPIPRAZOLE 2 MG PO TABS
2.0000 mg | ORAL_TABLET | Freq: Every day | ORAL | Status: DC
  Administered 2023-11-10: 2 mg via ORAL
  Filled 2023-11-10: qty 1

## 2023-11-10 NOTE — Group Note (Signed)
 Date:  11/10/2023 Time:  10:51 AM  Group Topic/Focus:  Goals Group:   The focus of this group is to help patients establish daily goals to achieve during treatment and discuss how the patient can incorporate goal setting into their daily lives to aide in recovery.    Participation Level:  Did Not Attend  [name] 11/10/2023, 10:51 AM

## 2023-11-10 NOTE — Progress Notes (Signed)
 PT Cancellation Note  Patient Details Name: [name] MRN: [id number] DOB: 07/11/1955   Cancelled Treatment:    Reason Eval/Treat Not Completed: Other (comment)  Pt in room sleeping.  LLE wound undressed and draining.  On towel.  Will return at a later time/date when wound is dressed for mobility.   Lauraine Gills 11/10/2023, 1:19 PM

## 2023-11-10 NOTE — Group Note (Signed)
 Recreation Therapy Group Note   Group Topic:Other  Group Date: 11/10/2023 Start Time: 1100 End Time: 1120 Facilitators: Celestia Jeoffrey BRAVO, LRT, CTRS Location: Dayroom  Activity Description/Intervention: Therapeutic Drumming. Patients with peers and staff were given the opportunity to engage in a leader facilitated [employer] Group Empowerment Drumming Circle with staff from the [employer], in partnership with [employer]. Teaching laboratory technician and trained [employer], Norleen Mon leading with LRT observing and documenting intervention and pt response. This evidenced-based practice targets 7 areas of health and wellbeing in the human experience including: stress-reduction, exercise, self-expression, camaraderie/support, nurturing, spirituality, and music-making (leisure).    Goal Area(s) Addresses:  Patient will engage in pro-social way in music group.  Patient will follow directions of drum leader on the first prompt. Patient will demonstrate no behavioral issues during group.  Patient will identify if a reduction in stress level occurs as a result of participation in therapeutic drum circle.     Affect/Mood: N/A   Participation Level: Did not attend    Clinical Observations/Individualized Feedback: Patient did not attend group.   Plan: Continue to engage patient in RT group sessions 2-3x/week.   Jeoffrey BRAVO Celestia, LRT, CTRS 11/10/2023 2:00 PM

## 2023-11-10 NOTE — Progress Notes (Signed)
 Estimated Sleeping Duration (Last 24 Hours): 8.00-9.50 hours   (Sleep Hours) - 8-9.5 hrs (Any PRNs that were needed, meds refused, or side effects to meds)- none  (Any disturbances and when (visitation, over night)- none noted (Concerns raised by the patient)- None voiced (SI/HI/AVH)- Denied all  Pt. Slept throughout the night. Assisted to the bedside commode x1 throughout the night. Remains on continuous 3L of O2 per order. Scheduled wound changed held per Dr. Bard note/ request for 48hrs, after bleeding episode. No distress noted/ reported. Pt. Compliant with 6am medication. Currently lying in bed awake.    11/09/23 2248  Psych Admission Type (Psych Patients Only)  Admission Status Voluntary  Psychosocial Assessment  Patient Complaints Depression  Eye Contact Fair  Facial Expression Flat  Affect Irritable  Speech Soft  Interaction Assertive;Demanding  Motor Activity Slow  Appearance/Hygiene In hospital gown  Behavior Characteristics Unwilling to participate  Mood Depressed;Irritable  Aggressive Behavior  Effect No apparent injury  Thought Process  Coherency WDL  Content WDL  Delusions WDL;None reported or observed  Perception WDL  Hallucination None reported or observed  Judgment Impaired  Confusion Mild  Danger to Self  Current suicidal ideation? Denies  Agreement Not to Harm Self Yes  Description of Agreement verbal  Danger to Others  Danger to Others None reported or observed

## 2023-11-10 NOTE — Group Note (Signed)

## 2023-11-10 NOTE — Progress Notes (Signed)
   11/10/23 0900  Psych Admission Type (Psych Patients Only)  Admission Status Voluntary  Psychosocial Assessment  Patient Complaints Depression;Sadness  Eye Contact Fair  Facial Expression Worried  Affect Depressed;Sad  Speech Soft  Interaction Assertive;Needy  Motor Activity Slow  Appearance/Hygiene In hospital gown  Behavior Characteristics Unwilling to participate  Mood Depressed;Sad  Thought Process  Coherency WDL  Content WDL  Delusions None reported or observed  Perception WDL  Hallucination None reported or observed  Judgment Impaired  Confusion Mild  Danger to Self  Current suicidal ideation? Denies  Agreement Not to Harm Self Yes  Description of Agreement verbal  Danger to Others  Danger to Others None reported or observed   Assisted to and fro bathroom with walker. Continues on oxygen @ 2L via [place] with no SOB noted. Wound on bilateral lower extremities remain open awaiting consult from wound care nurse. No bleeding noted. Tolerated all meals with no symptoms of hypo/hyperglycemic symptoms noted.

## 2023-11-10 NOTE — Progress Notes (Signed)
 [facility] MD Progress Note  11/10/2023 9:23 PM [name]  MRN:  [id number]  [name] is a 68 y/o female admitted to the [facility] gero-psych unit. She has significant and complex PMH including MDD, anxiety, HTN, DM2, a-fib on Eliquis , DVT/PE with IVF filter, HFpEF with pulmonary HTN, CKD stage IV, chronic pain, and morbid obesity with a BMI of 56 on 2-3L of oxygen at baseline. She is DNR/DNI with limited additional interventions on hospice, also receiving palliative care. She initially presented to the ED on 11/02/23 following an intentional overdose on 40 oxycodone  pills (5 mg). She was found by her home nurse the next morning who called 911.   Subjective: She is reviewed and discussed the case with the treatment team.  On interview patient is noted to be resting in chair.  She offers no complaints and reports having good night sleep.  She informed the provider to call her brother first before calling the sister.  Provider informed the patient that her sister's phone is going to voicemail that this would generate and the team including the provider has tried multiple times at this point.  Patient expressed her interest in working with social work team and her family to find placement close or in between her brother and sister.  She denies SI/HI/plan and is not endorsing any hallucinations and is not responding to any internal stimuli.  We discussed the plan of adding a mood stabilizer either Abilify  or Trileptal depending on the EKG and QTc interval.  Patient agreed with the plan.    Principal Problem: MDD (major depressive disorder), recurrent severe, without psychosis (HCC) Diagnosis: Principal Problem:   MDD (major depressive disorder), recurrent severe, without psychosis (HCC) Active Problems:   Chronic venous insufficiency   Lower extremity ulceration, unspecified laterality, limited to breakdown of skin (HCC)   Past Psychiatric History: MDD and anxiety, compliant on medication    Information collected from the patient and chart review   Prev Dx/Sx: MDD and anxiety Current Psych Provider: None Home Meds (current): Celexa  40 mg daily and Valium  5 mg bid Previous Med Trials: No Therapy: Not currently   Prior Psych Hospitalization: Denies  Prior Self Harm: Denies Prior Violence: Denies   Family Psych History: Depression, some alcohol abuse Family Hx suicide: Great nephew committed suicide   Social History:  Educational Hx: High school graduate 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 Hx: Denies Living Situation: Lives alone in private residence Spiritual Hx: Christian Access to weapons/lethal means: Denies    Substance History Alcohol: Denies use History of alcohol withdrawal seizures: N/a History of DT's: N/a Tobacco: Former; quit in 1990 Illicit drugs: No Prescription drug abuse: No Rehab hx: No  Past Medical History:  Past Medical History:  Diagnosis Date   A-fib (HCC)    Anemia    Anticoagulated on Coumadin , chronically 09/03/2011   Anxiety    Arthritis    right hip; both knees; left wrist/shoulder; back (01/19/2013   Bleeding on Coumadin  08/2012; 01/18/2013   [facility] admissions (01/19/2013)   CHF (congestive heart failure) (HCC)    2-3 times (01/19/2013)   Chronic lower back pain    Depression    DVT (deep venous thrombosis) (HCC) 10 years ago   numerous/notes 01/18/2013   GERD (gastroesophageal reflux disease)    Gout    Headache(784.0)    maybe weekly (01/19/2013)   Heart murmur    High cholesterol    been off RX for this at one time (01/18/2013)   History  of blood transfusion 1983; 04/2012   3 w/ childbirth; hospitalized for pain (01/19/2013)   Hypertension    Hypothyroidism    Migraines    twice/yr maybe (01/19/2013)   Obstructive sleep apnea 05/03/2012   OSA (obstructive sleep apnea)    sent me for test in 04/2012; never ordered mask, etc (01/19/2013)   PE (pulmonary thromboembolism) (HCC) 3 years ag0    3/notes 01/18/2013   Pneumonia before 2011   once' (01/18/2013)   Renal disorder    kindey function low; Metformin was destroying my kidneys (01/19/2013)   Shortness of breath    only related to my CHF (01/18/2013)   Swelling of hand 08/31/2014   RT HAND   Type II diabetes mellitus (HCC)    UTI (urinary tract infection) 08/31/2014    Past Surgical History:  Procedure Laterality Date   CARDIAC CATHETERIZATION N/A 03/13/2015   Procedure: Right/Left Heart Cath and Coronary Angiography;  Surgeon: Gordy Bergamo, MD;  Location: [facility] INVASIVE CV LAB;  Service: Cardiovascular;  Laterality: N/A;   CATARACT EXTRACTION W/ INTRAOCULAR LENS  IMPLANT, BILATERAL Bilateral 2006-2011   CESAREAN SECTION  1983   CHOLECYSTECTOMY  ~ 2002   COLONOSCOPY N/A 01/21/2013   Procedure: COLONOSCOPY;  Surgeon: Belvie JONETTA Just, MD;  Location: [facility] ENDOSCOPY;  Service: Endoscopy;  Laterality: N/A;   EYE SURGERY Bilateral    multiple (01/18/2013)   PACEMAKER IMPLANT N/A 08/31/2018   Symptomatic bradycardia due to mobitz II second degree AV block, permanent afib/ atypical atrial flutter implanted by Dr Kelsie PRESCOTT PLANA REPAIR OF RETINAL DEATACHMENT Right    PARS PLANA VITRECTOMY Bilateral 2004-2006   several (01/18/2013)   REFRACTIVE SURGERY Bilateral    for stigmatism (01/18/2013)   REFRACTIVE SURGERY Left ~ 11/2012   to puff it up cause vision got hazy (01/18/2013)   RIGHT HEART CATH N/A 12/30/2018   Procedure: RIGHT HEART CATH;  Surgeon: Cherrie Toribio SAUNDERS, MD;  Location: [facility] INVASIVE CV LAB;  Service: Cardiovascular;  Laterality: N/A;   RIGHT/LEFT HEART CATH AND CORONARY ANGIOGRAPHY N/A 09/07/2023   Procedure: RIGHT/LEFT HEART CATH AND CORONARY ANGIOGRAPHY;  Surgeon: Wendel Lurena POUR, MD;  Location: [facility] INVASIVE CV LAB;  Service: Cardiovascular;  Laterality: N/A;   VENA CAVA FILTER PLACEMENT  2011?   Family History:  Family History  Problem Relation Age of Onset   Cerebral aneurysm Mother    Hypertension  Father    Cerebral aneurysm Maternal Grandfather    Cerebral aneurysm Maternal Aunt    Cancer Maternal Uncle     Social History:  Social History   Substance and Sexual Activity  Alcohol Use Not Currently   Comment: rare now but never a heavy drinker     Social History   Substance and Sexual Activity  Drug Use No    Social History   Socioeconomic History   Marital status: Single    Spouse name: Not on file   Number of children: Not on file   Years of education: Not on file   Highest education level: Not on file  Occupational History   Occupation: disabled  Tobacco Use   Smoking status: Former    Current packs/day: 0.00    Average packs/day: 0.1 packs/day for 30.0 years (1.5 ttl pk-yrs)    Types: Cigarettes    Start date: 73    Quit date: 50    Years since quitting: 35.6   Smokeless tobacco: Never   Tobacco comments:    01/19/2013 quit smoking cigarettes in  the early '90's  Vaping Use   Vaping status: Never Used  Substance and Sexual Activity   Alcohol use: Not Currently    Comment: rare now but never a heavy drinker   Drug use: No   Sexual activity: Never  Other Topics Concern   Not on file  Social History Narrative   Not on file   Social Drivers of Health   Financial Resource Strain: Not on file  Food Insecurity: Patient Declined (11/05/2023)   Hunger Vital Sign    Worried About Running Out of Food in the Last Year: Patient declined    Ran Out of Food in the Last Year: Patient declined  Transportation Needs: Patient Declined (11/05/2023)   PRAPARE - Administrator, Civil Service (Medical): Patient declined    Lack of Transportation (Non-Medical): Patient declined  Physical Activity: Not on file  Stress: Not on file  Social Connections: Socially Isolated (11/05/2023)   Social Connection and Isolation Panel    Frequency of Communication with Friends and Family: More than three times a week    Frequency of Social Gatherings with Friends  and Family: Once a week    Attends Religious Services: Never    Database administrator or Organizations: No    Attends Engineer, structural: Never    Marital Status: Divorced   Additional Social History:                         Sleep: Fair Estimated Sleeping Duration (Last 24 Hours): 9.50-11.25 hours  Appetite:  Good  Current Medications: Current Facility-Administered Medications  Medication Dose Route Frequency Provider Last Rate Last Admin   acetaminophen  (TYLENOL ) tablet 650 mg  650 mg Oral Q6H PRN Mannie Jerel PARAS, NP   650 mg at 11/05/23 2158   allopurinol  (ZYLOPRIM ) tablet 50 mg  50 mg Oral QODAY Stevens, Terry J, NP   50 mg at 11/07/23 1109   alum & mag hydroxide-simeth (MAALOX/MYLANTA) 200-200-20 MG/5ML suspension 30 mL  30 mL Oral Q4H PRN Mannie Jerel PARAS, NP       amLODipine  (NORVASC ) tablet 10 mg  10 mg Oral Daily Mannie Jerel PARAS, NP   10 mg at 11/10/23 9142   apixaban  (ELIQUIS ) tablet 5 mg  5 mg Oral BID Mannie Jerel PARAS, NP   5 mg at 11/10/23 9141   citalopram  (CELEXA ) tablet 40 mg  40 mg Oral QHS Mannie Jerel PARAS, NP   40 mg at 11/09/23 2137   diazepam  (VALIUM ) tablet 5 mg  5 mg Oral BID PRN Mannie Jerel PARAS, NP       ferrous sulfate  tablet 325 mg  325 mg Oral Daily Mannie Jerel PARAS, NP   325 mg at 11/10/23 0857   guaiFENesin -dextromethorphan  (ROBITUSSIN DM) 100-10 MG/5ML syrup 5 mL  5 mL Oral Q8H PRN Mannie Jerel PARAS, NP       insulin  aspart (novoLOG ) injection 0-15 Units  0-15 Units Subcutaneous TID WC Mannie Jerel PARAS, NP   2 Units at 11/10/23 1720   insulin  aspart (novoLOG ) injection 0-5 Units  0-5 Units Subcutaneous QHS Mannie Jerel PARAS, NP       levothyroxine  (SYNTHROID ) tablet 75 mcg  75 mcg Oral QAC breakfast Mannie Jerel PARAS, NP   75 mcg at 11/10/23 9372   magnesium  hydroxide (MILK OF MAGNESIA) suspension 30 mL  30 mL Oral Daily PRN Mannie Jerel PARAS, NP       melatonin tablet 10 mg  10 mg Oral QHS Mannie Jerel PARAS, NP   10 mg at 11/09/23 2137    multivitamin with minerals tablet 1 tablet  1 tablet Oral Daily Mannie Jerel PARAS, NP   1 tablet at 11/10/23 9141   neomycin -bacitracin -polymyxin [id number] OINT 1 Application  1 Application Apply externally BID PRN Shrivastava, Aryendra, MD   1 Application at 11/08/23 0906   nystatin  (MYCOSTATIN /NYSTOP ) topical powder   Topical TID PRN Madaram, Kondal R, MD       OLANZapine  (ZYPREXA ) injection 5 mg  5 mg Intramuscular TID PRN Mannie Jerel PARAS, NP       OLANZapine  zydis (ZYPREXA ) disintegrating tablet 5 mg  5 mg Oral TID PRN Mannie Jerel PARAS, NP       oxyCODONE  (Oxy IR/ROXICODONE ) immediate release tablet 5 mg  5 mg Oral Q6H PRN Mannie Jerel PARAS, NP       rosuvastatin  (CRESTOR ) tablet 10 mg  10 mg Oral Daily Mannie Jerel PARAS, NP   10 mg at 11/10/23 0857   torsemide  (DEMADEX ) tablet 40 mg  40 mg Oral BID Mannie Jerel PARAS, NP   40 mg at 11/10/23 1721    Lab Results:  Results for orders placed or performed during the hospital encounter of 11/05/23 (from the past 48 hours)  Glucose, capillary     Status: Abnormal   Collection Time: 11/09/23  7:30 AM  Result Value Ref Range   Glucose-Capillary 137 (H) 70 - 99 mg/dL    Comment: Glucose reference range applies only to samples taken after fasting for at least 8 hours.  Glucose, capillary     Status: Abnormal   Collection Time: 11/09/23 11:56 AM  Result Value Ref Range   Glucose-Capillary 162 (H) 70 - 99 mg/dL    Comment: Glucose reference range applies only to samples taken after fasting for at least 8 hours.  Glucose, capillary     Status: Abnormal   Collection Time: 11/09/23  4:40 PM  Result Value Ref Range   Glucose-Capillary 145 (H) 70 - 99 mg/dL    Comment: Glucose reference range applies only to samples taken after fasting for at least 8 hours.  Glucose, capillary     Status: Abnormal   Collection Time: 11/09/23  7:46 PM  Result Value Ref Range   Glucose-Capillary 137 (H) 70 - 99 mg/dL    Comment: Glucose reference range applies only  to samples taken after fasting for at least 8 hours.  Glucose, capillary     Status: Abnormal   Collection Time: 11/10/23  7:30 AM  Result Value Ref Range   Glucose-Capillary 143 (H) 70 - 99 mg/dL    Comment: Glucose reference range applies only to samples taken after fasting for at least 8 hours.  Glucose, capillary     Status: Abnormal   Collection Time: 11/10/23 11:21 AM  Result Value Ref Range   Glucose-Capillary 151 (H) 70 - 99 mg/dL    Comment: Glucose reference range applies only to samples taken after fasting for at least 8 hours.  Glucose, capillary     Status: Abnormal   Collection Time: 11/10/23  4:09 PM  Result Value Ref Range   Glucose-Capillary 140 (H) 70 - 99 mg/dL    Comment: Glucose reference range applies only to samples taken after fasting for at least 8 hours.  Glucose, capillary     Status: Abnormal   Collection Time: 11/10/23  8:08 PM  Result Value Ref Range   Glucose-Capillary 142 (H) 70 - 99 mg/dL  Comment: Glucose reference range applies only to samples taken after fasting for at least 8 hours.    Blood Alcohol level:  Lab Results  Component Value Date   [facility] <15 11/02/2023    Metabolic Disorder Labs: Lab Results  Component Value Date   HGBA1C 5.7 (H) 09/04/2023   MPG 116.89 09/04/2023   MPG 125.5 09/09/2021   No results found for: PROLACTIN Lab Results  Component Value Date   CHOL 105 09/03/2023   TRIG 56 09/03/2023   HDL 50 09/03/2023   CHOLHDL 2.1 09/03/2023   VLDL 11 09/03/2023   LDLCALC 44 09/03/2023   LDLCALC 48 08/30/2018    Physical Findings: AIMS:  ,  ,  ,  ,  ,  ,   CIWA:    COWS:     Musculoskeletal: Strength & Muscle Tone: within normal limits Gait & Station: unable to stand Patient leans: N/A  Psychiatric Specialty Exam:  Presentation      Patient is a 68 year old female who appears stated age alert oriented x3 mood is okay affect is constricted lying in the bed thought processes logical and coherent denies any  suicidal homicidal thoughts no perceptual disturbances  insight and judgment at baseline    Physical Exam: Physical Exam Vitals and nursing note reviewed.    ROS Blood pressure (!) 158/54, pulse (!) 51, temperature (!) 97.2 F (36.2 C), resp. rate 18, height 5' 1 (1.549 m), weight 136.1 kg, SpO2 100%. Body mass index is 56.68 kg/m.   Treatment Plan Summary: Principal Diagnosis: MDD (major depressive disorder), recurrent severe, without psychosis (HCC) Diagnosis:  Principal Problem:   MDD (major depressive disorder), recurrent severe, without psychosis (HCC)     Clinical Decision Making: Patient is exhibiting s/sx consistent with MDD, severe in nature due to her suicide attempt via OD. She is medically complex and requires a high level of care. Due to her lethal overdose, she requires continued inpatient observation, safety monitoring, and medication management.    Treatment Plan Summary:   Safety and Monitoring:             -- Voluntary admission to inpatient psychiatric unit for safety, stabilization and treatment             -- Daily contact with patient to assess and evaluate symptoms and progress in treatment             -- Patient's case to be discussed in multi-disciplinary team meeting             -- Observation Level: q15 minute checks             -- Vital signs:  q12 hours             -- Precautions: suicide, elopement, and assault   2. Psychiatric Diagnoses and Treatment:               Major depressive disorder, severe, without psychotic features  -Continue home Celexa  40 mg PO once daily and Valium  5 mg PO bid  11/02/23 EKG qtc 475-will add Abilify  2 mg nightly to help as an adjunct to MDD and mood stabilization -Focus on improving coping skills  -Referral for outpatient psychiatrist upon discharge  -Will reach out to the patient's brother and sister   -- The risks/benefits/side-effects/alternatives to this medication were discussed in detail with the patient and  time was given for questions. The patient consents to medication trial.                --  Metabolic profile and EKG monitoring obtained while on an atypical antipsychotic (BMI: Lipid Panel: HbgA1c: QTc:)              -- Encouraged patient to participate in unit milieu and in scheduled group therapies                            3. Medical Issues Being Addressed:  Multiple complex medical issues including worsening diastolic heart failure and pulmonary HTN on baseline oxygen, CKD stage IV, morbid obesity and limited ability to ambulate. UTI noted on UA in the ED.   -UTI being treated with Keflex  500 mg PO q12h for 5 days -Has been bradycardic (HR in the low 50's) x3, hypotensive with diastolic in the 50's. Will withhold home meds as indicated for these problems.  -Continue home medications, holding antihypertensives when diastolic BP is <60 mmHg. -Consult wound care for bilateral lower leg wounds -Maintain SpO2 > 95%, currently on 3L [place]      4. Discharge Planning:              -- Social work and case management to assist with discharge planning and identification of hospital follow-up needs prior to discharge             -- Estimated LOS: 5-7 days             -- Discharge Concerns: Need to establish a safety plan; Medication compliance and effectiveness             -- Discharge Goals: Return home with outpatient referrals follow ups   Physician Treatment Plan for Primary Diagnosis: MDD (major depressive disorder), recurrent severe, without psychosis (HCC) Long Term Goal(s): Improvement in symptoms so as ready for discharge   Short Term Goals: Ability to identify changes in lifestyle to reduce recurrence of condition will improve, Ability to verbalize feelings will improve, Ability to disclose and discuss suicidal ideas, Ability to demonstrate self-control will improve, Ability to identify and develop effective coping behaviors will improve, and Ability to identify triggers associated with  substance abuse/mental health issues will improve   Physician Treatment Plan for Secondary Diagnosis: Principal Problem:   MDD (major depressive disorder), recurrent severe, without psychosis (HCC)   Long Term Goal(s): Improvement in symptoms so as ready for discharge   Short Term Goals: Ability to identify changes in lifestyle to reduce recurrence of condition will improve, Ability to disclose and discuss suicidal ideas, Ability to demonstrate self-control will improve, Ability to identify and develop effective coping behaviors will improve, and Ability to identify triggers associated with substance abuse/mental health issues will improve    Allyn Foil, MD 11/10/2023, 9:23 PM

## 2023-11-10 NOTE — Group Note (Signed)
 Recreation Therapy Group Note   Group Topic:Stress Management  Group Date: 11/10/2023 Start Time: 1500 End Time: 1540 Facilitators: Celestia Jeoffrey BRAVO, LRT, CTRS Location: Dayroom  Group Description: Meditation. LRT and patients discussed what they know about meditation and mindfulness. LRT played a Deep Breathing Meditation exercise script for patients to follow along to. LRT and patients discussed how meditation and deep breathing can be used as a coping skill post--discharge to help manage symptoms of stress.    Goal Area(s) Addressed: Patient will practice using relaxation technique. Patient will identify a new coping skill.  Patient will follow multistep directions to reduce anxiety and stress.   Affect/Mood: N/A   Participation Level: Did not attend    Clinical Observations/Individualized Feedback: Patient did not attend group.   Plan: Continue to engage patient in RT group sessions 2-3x/week.   [address], LRT, CTRS 11/10/2023 4:12 PM

## 2023-11-10 NOTE — Plan of Care (Signed)
  Problem: Coping: Goal: Coping ability will improve Outcome: Progressing   Problem: Medication: Goal: Compliance with prescribed medication regimen will improve Outcome: Progressing   Problem: Safety: Goal: Ability to remain free from injury will improve Outcome: Progressing

## 2023-11-10 NOTE — Progress Notes (Signed)
 Progress Note    11/10/2023 7:36 AM * No surgery found *  Subjective:  [name] is a 68 yo female who is currently being admitted to the Adult Geropsychiatry unit for suicide attempt.  Patient was recently seen this weekend by Dr. Selinda Gu MD for left lower extremity bleeding.  Patient has a long history of venous stasis ulcerations for many years.  Her wounds have been wrapped in the past to help with swelling as well as ulceration.  Apparently her wounds were unwrapped and she got the shower hit her leg on something and started having brisk bleeding.  Direct pressure was placed on that area by the nurse.  The patient was assisted back to bed and her leg was wrapped.  Bleeding was then controlled.  Upon follow-up today the patient's bilateral lower extremities are consistent with venous stasis and ulcerations now with weeping blisters in multiple areas.  Her skin is very dry and peeling.  Patient does endorse that her bilateral lower extremities hurt when they get the swollen but they start to weep.  Vascular surgery today took down the dressings to observe.   Vitals:   11/09/23 1946 11/10/23 0718  BP: (!) 160/55 (!) 158/60  Pulse: (!) 50 (!) 50  Resp:  16  Temp: 97.7 F (36.5 C) 97.8 F (36.6 C)  SpO2: 98% 98%   Physical Exam: Cardiac: Irregular bradycardic.  No murmurs noted. Lungs: Clear on auscultation but very diminished in the bases. Incisions: None Extremities: Bilateral lower extremities with severe venous stasis and ulceration.  Now with weeping and blistering.  Unable to palpate both DP and PT pulses bilaterally. Abdomen: Morbid obesity, positive bowel sounds throughout.  Soft and nontender. Neurologic: Alert and oriented x 3, answers all questions and follows commands appropriately.  CBC    Component Value Date/Time   WBC 7.9 11/08/2023 1653   RBC 2.78 (L) 11/08/2023 1653   HGB 9.0 (L) 11/08/2023 1653   HGB 10.6 (L) 09/18/2023 1606   HGB 10.8 (L) 11/08/2015  1024   HCT 30.0 (L) 11/08/2023 1653   HCT 34.5 09/18/2023 1606   HCT 33.8 (L) 11/08/2015 1024   PLT 142 (L) 11/08/2023 1653   PLT 133 (L) 09/18/2023 1606   MCV 107.9 (H) 11/08/2023 1653   MCV 107 (H) 09/18/2023 1606   MCV 99.1 11/08/2015 1024   MCH 32.4 11/08/2023 1653   MCHC 30.0 11/08/2023 1653   RDW 16.3 (H) 11/08/2023 1653   RDW 13.2 09/18/2023 1606   RDW 16.1 (H) 11/08/2015 1024   LYMPHSABS 0.4 (L) 11/08/2023 1653   LYMPHSABS 0.7 (L) 11/08/2015 1024   MONOABS 0.5 11/08/2023 1653   MONOABS 0.5 11/08/2015 1024   EOSABS 0.2 11/08/2023 1653   EOSABS 0.4 11/08/2015 1024   BASOSABS 0.0 11/08/2023 1653   BASOSABS 0.0 11/08/2015 1024    BMET    Component Value Date/Time   NA 140 11/02/2023 1239   NA 143 09/18/2023 1606   NA 143 11/16/2014 1140   K 4.2 11/02/2023 1239   K 3.7 11/16/2014 1140   CL 103 11/02/2023 1230   CO2 25 11/02/2023 1230   CO2 31 (H) 11/16/2014 1140   GLUCOSE 162 (H) 11/02/2023 1230   GLUCOSE 71 11/16/2014 1140   BUN 67 (H) 11/02/2023 1230   BUN 82 (HH) 09/18/2023 1606   BUN 31.1 (H) 11/16/2014 1140   CREATININE 2.28 (H) 11/02/2023 1230   CREATININE 1.3 (H) 11/16/2014 1140   CALCIUM  9.1 11/02/2023 1230  CALCIUM  9.4 11/16/2014 1140   GFRNONAA 23 (L) 11/02/2023 1230   GFRAA 41 (L) 10/05/2019 1252    INR    Component Value Date/Time   INR 1.3 (H) 09/07/2023 0522   INR 1.90 (L) 03/27/2016 1108    No intake or output data in the 24 hours ending 11/10/23 0736   Assessment/Plan:  68 y.o. female is s/p bleeding from lower extremities with an extensive history of venous stasis with ulcerations.* No surgery found *   PLAN Vascular surgery recommends wound consultation with for bilateral lower extremity Unna boot wraps for extensive vascular insufficiency with ulceration and weeping.  Vascular surgery recommends Unna boot wrap changes weekly for the next 6 weeks.  Vascular surgery asked that medical staff take pictures when the Unna boots are removed  so we can follow along and evaluate.  They can also contact us  with the date and time in which the Unna boots will be changed out so we can evaluate as they are taken off.  DVT prophylaxis: Continue current anticoagulation.   Gwendlyn JONELLE [facility]ank [facility] 11/10/2023 7:36 AM

## 2023-11-10 NOTE — [facility] Counselor (Signed)
 CSW attempted to contact pt's sister [name], [phone], again per provider's request.  CSW left HIPAA compliant VM requesting return call.    Lum Croft, MSW, [place] 11/10/2023 10:45 AM

## 2023-11-10 NOTE — Plan of Care (Signed)
   Problem: Coping: Goal: Coping ability will improve Outcome: Progressing   Problem: Medication: Goal: Compliance with prescribed medication regimen will improve Outcome: Progressing

## 2023-11-11 ENCOUNTER — Inpatient Hospital Stay
Admission: RE | Admit: 2023-11-11 | Discharge: 2023-12-06 | DRG: 356 | Disposition: A | Discharge status: Skilled Nursing Facility | Attending: Internal Medicine | Admitting: Internal Medicine

## 2023-11-11 ENCOUNTER — Encounter (HOSPITAL_COMMUNITY): Payer: Self-pay

## 2023-11-11 ENCOUNTER — Other Ambulatory Visit: Payer: Self-pay

## 2023-11-11 ENCOUNTER — Inpatient Hospital Stay

## 2023-11-11 ENCOUNTER — Encounter: Payer: Self-pay | Admitting: Internal Medicine

## 2023-11-11 DIAGNOSIS — Z961 Presence of intraocular lens: Secondary | ICD-10-CM | POA: Diagnosis present

## 2023-11-11 DIAGNOSIS — Z8249 Family history of ischemic heart disease and other diseases of the circulatory system: Secondary | ICD-10-CM

## 2023-11-11 DIAGNOSIS — N184 Chronic kidney disease, stage 4 (severe): Secondary | ICD-10-CM | POA: Diagnosis present

## 2023-11-11 DIAGNOSIS — E875 Hyperkalemia: Secondary | ICD-10-CM | POA: Diagnosis not present

## 2023-11-11 DIAGNOSIS — T368X5A Adverse effect of other systemic antibiotics, initial encounter: Secondary | ICD-10-CM | POA: Diagnosis not present

## 2023-11-11 DIAGNOSIS — I2510 Atherosclerotic heart disease of native coronary artery without angina pectoris: Secondary | ICD-10-CM | POA: Diagnosis present

## 2023-11-11 DIAGNOSIS — R112 Nausea with vomiting, unspecified: Secondary | ICD-10-CM | POA: Diagnosis not present

## 2023-11-11 DIAGNOSIS — D631 Anemia in chronic kidney disease: Secondary | ICD-10-CM | POA: Diagnosis present

## 2023-11-11 DIAGNOSIS — I480 Paroxysmal atrial fibrillation: Secondary | ICD-10-CM | POA: Diagnosis present

## 2023-11-11 DIAGNOSIS — F419 Anxiety disorder, unspecified: Secondary | ICD-10-CM | POA: Diagnosis present

## 2023-11-11 DIAGNOSIS — E662 Morbid (severe) obesity with alveolar hypoventilation: Secondary | ICD-10-CM | POA: Diagnosis present

## 2023-11-11 DIAGNOSIS — T380X5A Adverse effect of glucocorticoids and synthetic analogues, initial encounter: Secondary | ICD-10-CM | POA: Diagnosis not present

## 2023-11-11 DIAGNOSIS — I890 Lymphedema, not elsewhere classified: Secondary | ICD-10-CM | POA: Diagnosis present

## 2023-11-11 DIAGNOSIS — Z6841 Body Mass Index (BMI) 40.0 and over, adult: Secondary | ICD-10-CM

## 2023-11-11 DIAGNOSIS — N170 Acute kidney failure with tubular necrosis: Secondary | ICD-10-CM | POA: Diagnosis not present

## 2023-11-11 DIAGNOSIS — Z9842 Cataract extraction status, left eye: Secondary | ICD-10-CM

## 2023-11-11 DIAGNOSIS — Z794 Long term (current) use of insulin: Secondary | ICD-10-CM

## 2023-11-11 DIAGNOSIS — Z87891 Personal history of nicotine dependence: Secondary | ICD-10-CM

## 2023-11-11 DIAGNOSIS — B999 Unspecified infectious disease: Secondary | ICD-10-CM | POA: Insufficient documentation

## 2023-11-11 DIAGNOSIS — Z87440 Personal history of urinary (tract) infections: Secondary | ICD-10-CM

## 2023-11-11 DIAGNOSIS — Z602 Problems related to living alone: Secondary | ICD-10-CM | POA: Diagnosis present

## 2023-11-11 DIAGNOSIS — E7800 Pure hypercholesterolemia, unspecified: Secondary | ICD-10-CM | POA: Diagnosis present

## 2023-11-11 DIAGNOSIS — T50902A Poisoning by unspecified drugs, medicaments and biological substances, intentional self-harm, initial encounter: Secondary | ICD-10-CM | POA: Insufficient documentation

## 2023-11-11 DIAGNOSIS — E860 Dehydration: Secondary | ICD-10-CM | POA: Diagnosis not present

## 2023-11-11 DIAGNOSIS — E039 Hypothyroidism, unspecified: Secondary | ICD-10-CM | POA: Diagnosis present

## 2023-11-11 DIAGNOSIS — Z91119 Patient's noncompliance with dietary regimen due to unspecified reason: Secondary | ICD-10-CM

## 2023-11-11 DIAGNOSIS — M545 Low back pain, unspecified: Secondary | ICD-10-CM | POA: Diagnosis present

## 2023-11-11 DIAGNOSIS — Z9151 Personal history of suicidal behavior: Secondary | ICD-10-CM

## 2023-11-11 DIAGNOSIS — I2723 Pulmonary hypertension due to lung diseases and hypoxia: Secondary | ICD-10-CM | POA: Diagnosis present

## 2023-11-11 DIAGNOSIS — G8929 Other chronic pain: Secondary | ICD-10-CM | POA: Diagnosis present

## 2023-11-11 DIAGNOSIS — Z959 Presence of cardiac and vascular implant and graft, unspecified: Secondary | ICD-10-CM

## 2023-11-11 DIAGNOSIS — D696 Thrombocytopenia, unspecified: Secondary | ICD-10-CM | POA: Diagnosis present

## 2023-11-11 DIAGNOSIS — I130 Hypertensive heart and chronic kidney disease with heart failure and stage 1 through stage 4 chronic kidney disease, or unspecified chronic kidney disease: Secondary | ICD-10-CM | POA: Diagnosis present

## 2023-11-11 DIAGNOSIS — F332 Major depressive disorder, recurrent severe without psychotic features: Secondary | ICD-10-CM | POA: Diagnosis not present

## 2023-11-11 DIAGNOSIS — Z9049 Acquired absence of other specified parts of digestive tract: Secondary | ICD-10-CM

## 2023-11-11 DIAGNOSIS — E1152 Type 2 diabetes mellitus with diabetic peripheral angiopathy with gangrene: Secondary | ICD-10-CM | POA: Diagnosis present

## 2023-11-11 DIAGNOSIS — R188 Other ascites: Secondary | ICD-10-CM | POA: Diagnosis present

## 2023-11-11 DIAGNOSIS — E1122 Type 2 diabetes mellitus with diabetic chronic kidney disease: Principal | ICD-10-CM | POA: Diagnosis present

## 2023-11-11 DIAGNOSIS — Z9981 Dependence on supplemental oxygen: Secondary | ICD-10-CM

## 2023-11-11 DIAGNOSIS — I83029 Varicose veins of left lower extremity with ulcer of unspecified site: Secondary | ICD-10-CM | POA: Diagnosis present

## 2023-11-11 DIAGNOSIS — Z66 Do not resuscitate: Secondary | ICD-10-CM | POA: Diagnosis present

## 2023-11-11 DIAGNOSIS — Z809 Family history of malignant neoplasm, unspecified: Secondary | ICD-10-CM

## 2023-11-11 DIAGNOSIS — I5030 Unspecified diastolic (congestive) heart failure: Secondary | ICD-10-CM | POA: Diagnosis present

## 2023-11-11 DIAGNOSIS — Z7901 Long term (current) use of anticoagulants: Secondary | ICD-10-CM

## 2023-11-11 DIAGNOSIS — I447 Left bundle-branch block, unspecified: Secondary | ICD-10-CM | POA: Diagnosis present

## 2023-11-11 DIAGNOSIS — I272 Pulmonary hypertension, unspecified: Secondary | ICD-10-CM | POA: Diagnosis present

## 2023-11-11 DIAGNOSIS — I10 Essential (primary) hypertension: Secondary | ICD-10-CM | POA: Diagnosis present

## 2023-11-11 DIAGNOSIS — Z86711 Personal history of pulmonary embolism: Secondary | ICD-10-CM

## 2023-11-11 DIAGNOSIS — K5900 Constipation, unspecified: Principal | ICD-10-CM | POA: Diagnosis present

## 2023-11-11 DIAGNOSIS — Z79890 Hormone replacement therapy (postmenopausal): Secondary | ICD-10-CM

## 2023-11-11 DIAGNOSIS — E66813 Obesity, class 3: Secondary | ICD-10-CM | POA: Diagnosis present

## 2023-11-11 DIAGNOSIS — I4821 Permanent atrial fibrillation: Secondary | ICD-10-CM | POA: Diagnosis present

## 2023-11-11 DIAGNOSIS — Z9841 Cataract extraction status, right eye: Secondary | ICD-10-CM

## 2023-11-11 DIAGNOSIS — N2889 Other specified disorders of kidney and ureter: Secondary | ICD-10-CM | POA: Diagnosis not present

## 2023-11-11 DIAGNOSIS — L02411 Cutaneous abscess of right axilla: Secondary | ICD-10-CM | POA: Diagnosis not present

## 2023-11-11 DIAGNOSIS — Z885 Allergy status to narcotic agent status: Secondary | ICD-10-CM

## 2023-11-11 DIAGNOSIS — J9611 Chronic respiratory failure with hypoxia: Secondary | ICD-10-CM | POA: Diagnosis present

## 2023-11-11 DIAGNOSIS — Z1629 Resistance to other single specified antibiotic: Secondary | ICD-10-CM | POA: Diagnosis present

## 2023-11-11 DIAGNOSIS — I5033 Acute on chronic diastolic (congestive) heart failure: Secondary | ICD-10-CM | POA: Diagnosis not present

## 2023-11-11 DIAGNOSIS — Z86718 Personal history of other venous thrombosis and embolism: Secondary | ICD-10-CM

## 2023-11-11 DIAGNOSIS — B9562 Methicillin resistant Staphylococcus aureus infection as the cause of diseases classified elsewhere: Secondary | ICD-10-CM | POA: Diagnosis not present

## 2023-11-11 DIAGNOSIS — Z95828 Presence of other vascular implants and grafts: Secondary | ICD-10-CM

## 2023-11-11 DIAGNOSIS — F321 Major depressive disorder, single episode, moderate: Secondary | ICD-10-CM | POA: Diagnosis present

## 2023-11-11 DIAGNOSIS — R3589 Other polyuria: Secondary | ICD-10-CM

## 2023-11-11 DIAGNOSIS — L304 Erythema intertrigo: Secondary | ICD-10-CM | POA: Diagnosis not present

## 2023-11-11 DIAGNOSIS — Z79899 Other long term (current) drug therapy: Secondary | ICD-10-CM

## 2023-11-11 LAB — COMPREHENSIVE METABOLIC PANEL WITH GFR
ALT: 14 U/L (ref 0–44)
ALT: 14 U/L (ref 0–44)
AST: 22 U/L (ref 15–41)
AST: 21 U/L (ref 15–41)
Albumin: 3.7 g/dL (ref 3.5–5.0)
Albumin: 3.4 g/dL — ABNORMAL LOW (ref 3.5–5.0)
Alkaline Phosphatase: 59 U/L (ref 38–126)
Alkaline Phosphatase: 55 U/L (ref 38–126)
Anion gap: 12 (ref 5–15)
Anion gap: 9 (ref 5–15)
BUN: 52 mg/dL — ABNORMAL HIGH (ref 8–23)
BUN: 50 mg/dL — ABNORMAL HIGH (ref 8–23)
CO2: 30 mmol/L (ref 22–32)
CO2: 33 mmol/L — ABNORMAL HIGH (ref 22–32)
Calcium: 9.5 mg/dL (ref 8.9–10.3)
Calcium: 9.3 mg/dL (ref 8.9–10.3)
Chloride: 99 mmol/L (ref 98–111)
Chloride: 100 mmol/L (ref 98–111)
Creatinine, Ser: 1.46 mg/dL — ABNORMAL HIGH (ref 0.44–1.00)
Creatinine, Ser: 1.48 mg/dL — ABNORMAL HIGH (ref 0.44–1.00)
GFR, Estimated: 39 mL/min — ABNORMAL LOW (ref >60)
GFR, Estimated: 39 mL/min — ABNORMAL LOW (ref >60)
Glucose, Bld: 183 mg/dL — ABNORMAL HIGH (ref 70–99)
Glucose, Bld: 183 mg/dL — ABNORMAL HIGH (ref 70–99)
Potassium: 4.7 mmol/L (ref 3.5–5.1)
Potassium: 4.7 mmol/L (ref 3.5–5.1)
Sodium: 141 mmol/L (ref 135–145)
Sodium: 142 mmol/L (ref 135–145)
Total Bilirubin: 0.9 mg/dL (ref 0.0–1.2)
Total Bilirubin: 1 mg/dL (ref 0.0–1.2)
Total Protein: 6.4 g/dL — ABNORMAL LOW (ref 6.5–8.1)
Total Protein: 6.2 g/dL — ABNORMAL LOW (ref 6.5–8.1)

## 2023-11-11 LAB — CBC WITH DIFFERENTIAL/PLATELET
Abs Immature Granulocytes: 0.02 K/uL (ref 0.00–0.07)
Abs Immature Granulocytes: 0.04 K/uL (ref 0.00–0.07)
Basophils Absolute: 0 K/uL (ref 0.0–0.1)
Basophils Absolute: 0 K/uL (ref 0.0–0.1)
Basophils Relative: 0 %
Basophils Relative: 0 %
Eosinophils Absolute: 0.1 K/uL (ref 0.0–0.5)
Eosinophils Absolute: 0.1 K/uL (ref 0.0–0.5)
Eosinophils Relative: 1 %
Eosinophils Relative: 1 %
HCT: 33.9 % — ABNORMAL LOW (ref 36.0–46.0)
HCT: 32.7 % — ABNORMAL LOW (ref 36.0–46.0)
Hemoglobin: 10.3 g/dL — ABNORMAL LOW (ref 12.0–15.0)
Hemoglobin: 9.7 g/dL — ABNORMAL LOW (ref 12.0–15.0)
Immature Granulocytes: 0 %
Immature Granulocytes: 1 %
Lymphocytes Relative: 6 %
Lymphocytes Relative: 5 %
Lymphs Abs: 0.5 K/uL — ABNORMAL LOW (ref 0.7–4.0)
Lymphs Abs: 0.4 K/uL — ABNORMAL LOW (ref 0.7–4.0)
MCH: 32.1 pg (ref 26.0–34.0)
MCH: 31.4 pg (ref 26.0–34.0)
MCHC: 30.4 g/dL (ref 30.0–36.0)
MCHC: 29.7 g/dL — ABNORMAL LOW (ref 30.0–36.0)
MCV: 105.6 fL — ABNORMAL HIGH (ref 80.0–100.0)
MCV: 105.8 fL — ABNORMAL HIGH (ref 80.0–100.0)
Monocytes Absolute: 0.4 K/uL (ref 0.1–1.0)
Monocytes Absolute: 0.4 K/uL (ref 0.1–1.0)
Monocytes Relative: 5 %
Monocytes Relative: 5 %
Neutro Abs: 7.6 K/uL (ref 1.7–7.7)
Neutro Abs: 6.4 K/uL (ref 1.7–7.7)
Neutrophils Relative %: 88 %
Neutrophils Relative %: 88 %
Platelets: 154 K/uL (ref 150–400)
Platelets: 139 K/uL — ABNORMAL LOW (ref 150–400)
RBC: 3.21 MIL/uL — ABNORMAL LOW (ref 3.87–5.11)
RBC: 3.09 MIL/uL — ABNORMAL LOW (ref 3.87–5.11)
RDW: 16.2 % — ABNORMAL HIGH (ref 11.5–15.5)
RDW: 16.4 % — ABNORMAL HIGH (ref 11.5–15.5)
WBC: 8.7 K/uL (ref 4.0–10.5)
WBC: 7.4 K/uL (ref 4.0–10.5)
nRBC: 0 % (ref 0.0–0.2)
nRBC: 0 % (ref 0.0–0.2)

## 2023-11-11 LAB — GLUCOSE, CAPILLARY
Glucose-Capillary: 174 mg/dL — ABNORMAL HIGH (ref 70–99)
Glucose-Capillary: 153 mg/dL — ABNORMAL HIGH (ref 70–99)
Glucose-Capillary: 165 mg/dL — ABNORMAL HIGH (ref 70–99)
Glucose-Capillary: 153 mg/dL — ABNORMAL HIGH (ref 70–99)

## 2023-11-11 MED ORDER — INSULIN ASPART 100 UNIT/ML IJ SOLN
0.0000 [IU] | Freq: Every day | INTRAMUSCULAR | Status: DC
  Administered 2023-11-16 – 2023-11-18 (×6): 2 [IU] via SUBCUTANEOUS
  Administered 2023-11-26: 3 [IU] via SUBCUTANEOUS
  Administered 2023-11-27: 2 [IU] via SUBCUTANEOUS
  Administered 2023-12-01: 3 [IU] via SUBCUTANEOUS
  Filled 2023-11-11 (×6): qty 1

## 2023-11-11 MED ORDER — MELATONIN 5 MG PO TABS
10.0000 mg | ORAL_TABLET | Freq: Every day | ORAL | Status: DC
  Administered 2023-11-11 – 2023-12-05 (×28): 10 mg via ORAL
  Filled 2023-11-11 (×25): qty 2

## 2023-11-11 MED ORDER — PROCHLORPERAZINE EDISYLATE 10 MG/2ML IJ SOLN: 10.0000 mg | Freq: Four times a day (QID) | INTRAMUSCULAR | Status: AC | PRN

## 2023-11-11 MED ORDER — ACETAMINOPHEN 650 MG RE SUPP: 650.0000 mg | Freq: Four times a day (QID) | RECTAL | Status: AC | PRN

## 2023-11-11 MED ORDER — LEVOTHYROXINE SODIUM 50 MCG PO TABS: 75.0000 ug | ORAL_TABLET | Freq: Every day | ORAL | Status: DC

## 2023-11-11 MED ORDER — HYDRALAZINE HCL 20 MG/ML IJ SOLN: 5.0000 mg | Freq: Four times a day (QID) | INTRAMUSCULAR | Status: AC | PRN

## 2023-11-11 MED ORDER — ALLOPURINOL 100 MG PO TABS
50.0000 mg | ORAL_TABLET | ORAL | Status: DC
  Administered 2023-11-12 – 2023-12-06 (×15): 50 mg via ORAL
  Filled 2023-11-11 (×17): qty 1

## 2023-11-11 MED ORDER — APIXABAN 5 MG PO TABS
5.0000 mg | ORAL_TABLET | Freq: Two times a day (BID) | ORAL | Status: DC
  Administered 2023-11-11 – 2023-11-14 (×6): 5 mg via ORAL
  Filled 2023-11-11 (×6): qty 1

## 2023-11-11 MED ORDER — DIAZEPAM 5 MG PO TABS
5.0000 mg | ORAL_TABLET | Freq: Two times a day (BID) | ORAL | Status: DC | PRN
  Administered 2023-11-30 – 2023-12-04 (×2): 5 mg via ORAL
  Filled 2023-11-11 (×2): qty 1

## 2023-11-11 MED ORDER — AMLODIPINE BESYLATE 10 MG PO TABS
10.0000 mg | ORAL_TABLET | Freq: Every day | ORAL | Status: DC
  Administered 2023-11-12 – 2023-12-06 (×27): 10 mg via ORAL
  Filled 2023-11-11 (×26): qty 1

## 2023-11-11 MED ORDER — ONDANSETRON HCL 4 MG PO TABS
4.0000 mg | ORAL_TABLET | Freq: Four times a day (QID) | ORAL | Status: AC | PRN
  Administered 2023-11-11: 4 mg via ORAL
  Filled 2023-11-11: qty 1

## 2023-11-11 MED ORDER — ONDANSETRON HCL 4 MG/2ML IJ SOLN
4.0000 mg | Freq: Four times a day (QID) | INTRAMUSCULAR | Status: AC | PRN
  Administered 2023-11-13: 4 mg via INTRAVENOUS
  Filled 2023-11-11: qty 2

## 2023-11-11 MED ORDER — DIPHENHYDRAMINE HCL 25 MG PO CAPS: 25.0000 mg | ORAL_CAPSULE | Freq: Every evening | ORAL | Status: AC | PRN

## 2023-11-11 MED ORDER — INSULIN GLARGINE-YFGN 100 UNIT/ML ~~LOC~~ SOLN
20.0000 [IU] | Freq: Every day | SUBCUTANEOUS | Status: DC
  Administered 2023-11-11 – 2023-12-02 (×25): 20 [IU] via SUBCUTANEOUS
  Filled 2023-11-11 (×23): qty 0.2

## 2023-11-11 MED ORDER — TORSEMIDE 20 MG PO TABS
40.0000 mg | ORAL_TABLET | Freq: Two times a day (BID) | ORAL | Status: DC
  Administered 2023-11-12 – 2023-11-13 (×3): 40 mg via ORAL
  Filled 2023-11-11 (×3): qty 2

## 2023-11-11 MED ORDER — GUAIFENESIN-DM 100-10 MG/5ML PO SYRP
5.0000 mL | ORAL_SOLUTION | Freq: Four times a day (QID) | ORAL | Status: DC | PRN
  Administered 2023-11-27: 5 mL via ORAL
  Filled 2023-11-11: qty 10

## 2023-11-11 MED ORDER — ACETAMINOPHEN 325 MG PO TABS
650.0000 mg | ORAL_TABLET | Freq: Four times a day (QID) | ORAL | Status: AC | PRN
Start: 2023-11-11 — End: 2023-11-16
  Administered 2023-11-15: 650 mg via ORAL
  Filled 2023-11-11: qty 2

## 2023-11-11 MED ORDER — INSULIN ASPART 100 UNIT/ML IJ SOLN
0.0000 [IU] | Freq: Three times a day (TID) | INTRAMUSCULAR | Status: DC
  Administered 2023-11-12: 2 [IU] via SUBCUTANEOUS
  Administered 2023-11-12: 5 [IU] via SUBCUTANEOUS
  Administered 2023-11-12 – 2023-11-13 (×3): 3 [IU] via SUBCUTANEOUS
  Administered 2023-11-14: 2 [IU] via SUBCUTANEOUS
  Administered 2023-11-14: 3 [IU] via SUBCUTANEOUS
  Administered 2023-11-14: 2 [IU] via SUBCUTANEOUS
  Administered 2023-11-15 (×2): 3 [IU] via SUBCUTANEOUS
  Administered 2023-11-15 – 2023-11-16 (×5): 2 [IU] via SUBCUTANEOUS
  Administered 2023-11-17 (×4): 3 [IU] via SUBCUTANEOUS
  Administered 2023-11-18 – 2023-11-19 (×5): 2 [IU] via SUBCUTANEOUS
  Administered 2023-11-19: 3 [IU] via SUBCUTANEOUS
  Administered 2023-11-19: 2 [IU] via SUBCUTANEOUS
  Administered 2023-11-20 (×2): 3 [IU] via SUBCUTANEOUS
  Administered 2023-11-20 – 2023-11-21 (×2): 2 [IU] via SUBCUTANEOUS
  Administered 2023-11-21: 3 [IU] via SUBCUTANEOUS
  Administered 2023-11-21: 2 [IU] via SUBCUTANEOUS
  Administered 2023-11-22 (×2): 3 [IU] via SUBCUTANEOUS
  Administered 2023-11-23 (×2): 2 [IU] via SUBCUTANEOUS
  Administered 2023-11-23 – 2023-11-25 (×4): 3 [IU] via SUBCUTANEOUS
  Administered 2023-11-25: 5 [IU] via SUBCUTANEOUS
  Administered 2023-11-26 (×2): 3 [IU] via SUBCUTANEOUS
  Administered 2023-11-27: 2 [IU] via SUBCUTANEOUS
  Administered 2023-11-27: 3 [IU] via SUBCUTANEOUS
  Administered 2023-11-27 – 2023-11-28 (×2): 2 [IU] via SUBCUTANEOUS
  Administered 2023-11-28 – 2023-11-29 (×4): 3 [IU] via SUBCUTANEOUS
  Administered 2023-11-30: 5 [IU] via SUBCUTANEOUS
  Administered 2023-11-30: 2 [IU] via SUBCUTANEOUS
  Administered 2023-11-30: 3 [IU] via SUBCUTANEOUS
  Administered 2023-12-01: 5 [IU] via SUBCUTANEOUS
  Administered 2023-12-01: 3 [IU] via SUBCUTANEOUS
  Administered 2023-12-01: 2 [IU] via SUBCUTANEOUS
  Administered 2023-12-02: 5 [IU] via SUBCUTANEOUS
  Administered 2023-12-02 (×2): 3 [IU] via SUBCUTANEOUS
  Administered 2023-12-03 (×3): 2 [IU] via SUBCUTANEOUS
  Administered 2023-12-04 (×3): 3 [IU] via SUBCUTANEOUS
  Administered 2023-12-05: 2 [IU] via SUBCUTANEOUS
  Administered 2023-12-05: 3 [IU] via SUBCUTANEOUS
  Administered 2023-12-06 (×2): 2 [IU] via SUBCUTANEOUS
  Filled 2023-11-11 (×64): qty 1

## 2023-11-11 MED ORDER — CITALOPRAM HYDROBROMIDE 20 MG PO TABS
40.0000 mg | ORAL_TABLET | Freq: Every day | ORAL | Status: DC
  Administered 2023-11-11: 40 mg via ORAL
  Filled 2023-11-11: qty 2

## 2023-11-11 MED ORDER — FERROUS SULFATE 325 (65 FE) MG PO TABS: 325.0000 mg | ORAL_TABLET | Freq: Every day | ORAL | Status: DC

## 2023-11-11 NOTE — Assessment & Plan Note (Signed)
 Home amlodipine  10 mg daily resumed Hydralazine  5 mg IV every 6 hours as needed for SBP greater 165, 5 days ordered

## 2023-11-11 NOTE — Plan of Care (Signed)

## 2023-11-11 NOTE — Assessment & Plan Note (Signed)
 -  This complicates overall care and prognosis.

## 2023-11-11 NOTE — Assessment & Plan Note (Signed)
 Currently better than baseline at CKD 3B on admission labs

## 2023-11-11 NOTE — Progress Notes (Signed)
   11/11/23 2000  Spiritual Encounters  Type of Visit Initial  Care provided to: Patient  Reason for visit Routine spiritual support  OnCall Visit Yes  Spiritual Framework  Presenting Themes Significant life change  Interventions  Spiritual Care Interventions Made Established relationship of care and support;Compassionate presence;Encouragement   Chaplain provided compassionate presence. The patient shared their love for dogs and how material things are not so important any more. Chaplain listened intently and heard the suffering of the patient physically and mentally. Chaplain encouraged patient and tried to make patient feel seen.

## 2023-11-11 NOTE — Progress Notes (Signed)
 Estimated Sleeping Duration (Last 24 Hours): 8.50-10.25 hours   (Sleep Hours) -8.50 - 10.25 hrs (Any PRNs that were needed, meds refused, or side effects to meds)- maalox 30mL for c/o indigestion.  (Any disturbances and when (visitation, over night)- none (Concerns raised by the patient)-  none (SI/HI/AVH)- Denied all  Patient has remained calm and cooperative with all aspects of nursing care provided. She was assisted to the restroom x2 this night. Remains on 2L of continuous O2 via [place]. Patient is currently awake in her room. All orders maintained as written.     11/11/23 0400  Psychosocial Assessment  Patient Complaints None  Eye Contact Fair  Facial Expression Worried  Affect Depressed;Sad  Speech Soft  Interaction Assertive;Needy  Motor Activity Slow  Appearance/Hygiene In hospital gown  Behavior Characteristics Appropriate to situation  Mood Depressed  Thought Process  Coherency WDL  Content WDL  Delusions WDL  Perception WDL  Hallucination None reported or observed  Judgment Impaired  Confusion Moderate  Danger to Self  Current suicidal ideation? Denies  Self-Injurious Behavior No self-injurious ideation or behavior indicators observed or expressed   Agreement Not to Harm Self Yes  Description of Agreement verbal  Danger to Others  Danger to Others None reported or observed

## 2023-11-11 NOTE — Progress Notes (Signed)
   11/11/23 1300  Psych Admission Type (Psych Patients Only)  Admission Status Voluntary  Psychosocial Assessment  Patient Complaints None  Eye Contact Fair  Facial Expression Worried  Affect Depressed;Sad  Speech Soft  Interaction Assertive;Needy  Motor Activity Slow  Appearance/Hygiene In hospital gown  Behavior Characteristics Appropriate to situation  Mood Depressed;Anxious;Sad  Thought Process  Coherency WDL  Content WDL  Delusions None reported or observed  Perception WDL  Hallucination None reported or observed  Judgment Impaired  Confusion Mild  Danger to Self  Current suicidal ideation? Denies  Agreement Not to Harm Self Yes  Description of Agreement verbal  Danger to Others  Danger to Others None reported or observed   [facility] Transfer Note  Situation: Patient remains alert and oriented 3 to situation. Reports ongoing nausea and vomiting since yesterday and continues to experience nausea this shift. Unable to tolerate any food or fluids. Refused all scheduled medications so far. Patient requested transfer to a medical floor for further management of her medical symptoms.  Background: Patient has bilateral lower extremity vascular wounds, which were assessed and dressed with UNNA boot wraps by the wound care nurse this shift. She remains on 2?L oxygen via nasal cannula and becomes dyspneic with exertion. No signs or symptoms of hyperglycemia or hypoglycemia noted.  Assessment:  Appears pale  Nausea/vomiting ongoing; unable to tolerate oral intake  Dyspneic on exertion  Bilateral vascular wounds dressed  Refused all medications this shift  Rapid response was initiated per leadership guidance  Hospitalist assessed the patient  Recommendation: Patient will be transferred to Room 124 for continued medical management and closer monitoring of her symptoms.

## 2023-11-11 NOTE — Assessment & Plan Note (Signed)
Home levothyroxine 75 mcg daily before breakfast resumed

## 2023-11-11 NOTE — Consult Note (Addendum)
 WOC Nurse Consult Note: Reason for Consult:apply Unna's boots bilaterally; ordered per vascular surgery  Wound type: venous stasis Pressure Injury POA: NA Measurement: r right pretibial scattered hypergranulated wounds; others with scabbing  LLE scattered open wounds largest 3cm x 4cm and clot over lateral wound 1.12mc x 1.5cm   Wound bed:see above  Drainage (amount, consistency, odor) serous  Periwound: serous filled blisters  Dressing procedure/placement/frequency: Unna's boots applied bilaterally, patient tolerated well. She however is actively dry heaving while I applied and she did vomit a small amount as well. She reports chest pressure; when I asked about pain 0-10 she stated not really pain, more pressure she is belching actively.  She is also drinking diet Sprite at the same. Notified nursing staff; completed compression wraps. She began to actively dry heave and was gagging but maintaining her airway, could talk to [facility] during this time.   Identified concerns for use of compression wraps with psychiatry, nursing staff, and vascular MD/PA.  Ligature risk as they can be removed by the patient if desired. Orders to place Unna's boots stand; therefore I have followed the NPs orders    WOC will follow weekly for Unna's boot changes, day may vary by coverage on the [facility] campus.   Kourosh Jablonsky [facility], CNS, CWON-AP [phone]

## 2023-11-11 NOTE — Assessment & Plan Note (Addendum)
 Check a UA Urine culture from 05/05/2023: Showed Enterobacter cloacae, pansensitive Patient received Keflex  500 mg p.o. twice daily, note stated that patient received 3 days however patient only completed 5 doses total (2.5 days) of treatment 11/05/2023: First dose 2158; 2 doses were received on 11/06/2023; 2 doses were received on 11/07/2023 Strict I's and O's  NOTE: If repeat UA is positive for nitrates, leukocytes, can give patient 3 full days of nitrofurantoin PO 100 mg PO BID.

## 2023-11-11 NOTE — Discharge Summary (Signed)
 Physician Discharge Summary Note  Patient:  [name] is an 68 y.o., female MRN:  [id number] DOB:  Mar 02, 1956 Patient phone:  [phone] (home)  Patient address:   [address] [place] [address],    Date of Admission:  11/05/2023 Date of Discharge: 11/11/23  Reason for Admission:  [name] is a 68 y/o female admitted to the [facility] gero-psych unit. She has significant and complex PMH including MDD, anxiety, HTN, DM2, a-fib on Eliquis , DVT/PE with IVF filter, HFpEF with pulmonary HTN, CKD stage IV, chronic pain, and morbid obesity with a BMI of 56 on 2-3L of oxygen at baseline. She is DNR/DNI with limited additional interventions on hospice, also receiving palliative care. She initially presented to the ED on 11/02/23 following an intentional overdose on 40 oxycodone  pills (5 mg). She was found by her home nurse the next morning who called 911.   Principal Problem: MDD (major depressive disorder), recurrent severe, without psychosis (HCC) Discharge Diagnoses: Principal Problem:   MDD (major depressive disorder), recurrent severe, without psychosis (HCC) Active Problems:   Chronic venous insufficiency   Lower extremity ulceration, unspecified laterality, limited to breakdown of skin (HCC)   Past Psychiatric History: see h&p  Family Psychiatric  History: see h&p Social History:  Social History   Substance and Sexual Activity  Alcohol Use Not Currently   Comment: rare now but never a heavy drinker     Social History   Substance and Sexual Activity  Drug Use No    Social History   Socioeconomic History   Marital status: Single    Spouse name: Not on file   Number of children: Not on file   Years of education: Not on file   Highest education level: Not on file  Occupational History   Occupation: disabled  Tobacco Use   Smoking status: Former    Current packs/day: 0.00    Average packs/day: 0.1 packs/day for 30.0 years (1.5 ttl pk-yrs)    Types:  Cigarettes    Start date: 11    Quit date: 74    Years since quitting: 35.6   Smokeless tobacco: Never   Tobacco comments:    01/19/2013 quit smoking cigarettes in the early '90's  Vaping Use   Vaping status: Never Used  Substance and Sexual Activity   Alcohol use: Not Currently    Comment: rare now but never a heavy drinker   Drug use: No   Sexual activity: Never  Other Topics Concern   Not on file  Social History Narrative   Not on file   Social Drivers of Health   Financial Resource Strain: Not on file  Food Insecurity: Patient Declined (11/05/2023)   Hunger Vital Sign    Worried About Running Out of Food in the Last Year: Patient declined    Ran Out of Food in the Last Year: Patient declined  Transportation Needs: Patient Declined (11/05/2023)   PRAPARE - Administrator, Civil Service (Medical): Patient declined    Lack of Transportation (Non-Medical): Patient declined  Physical Activity: Not on file  Stress: Not on file  Social Connections: Socially Isolated (11/05/2023)   Social Connection and Isolation Panel    Frequency of Communication with Friends and Family: More than three times a week    Frequency of Social Gatherings with Friends and Family: Once a week    Attends Religious Services: Never    Database administrator or Organizations: No    Attends Club or  Organization Meetings: Never    Marital Status: Divorced   Past Medical History:  Past Medical History:  Diagnosis Date   A-fib (HCC)    Anemia    Anticoagulated on Coumadin , chronically 09/03/2011   Anxiety    Arthritis    right hip; both knees; left wrist/shoulder; back (01/19/2013   Bleeding on Coumadin  08/2012; 01/18/2013   [facility] admissions (01/19/2013)   CHF (congestive heart failure) (HCC)    2-3 times (01/19/2013)   Chronic lower back pain    Depression    DVT (deep venous thrombosis) (HCC) 10 years ago   numerous/notes 01/18/2013   GERD (gastroesophageal reflux disease)     Gout    Headache(784.0)    maybe weekly (01/19/2013)   Heart murmur    High cholesterol    been off RX for this at one time (01/18/2013)   History of blood transfusion 1983; 04/2012   3 w/ childbirth; hospitalized for pain (01/19/2013)   Hypertension    Hypothyroidism    Migraines    twice/yr maybe (01/19/2013)   Obstructive sleep apnea 05/03/2012   OSA (obstructive sleep apnea)    sent me for test in 04/2012; never ordered mask, etc (01/19/2013)   PE (pulmonary thromboembolism) (HCC) 3 years ag0   3/notes 01/18/2013   Pneumonia before 2011   once' (01/18/2013)   Renal disorder    kindey function low; Metformin was destroying my kidneys (01/19/2013)   Shortness of breath    only related to my CHF (01/18/2013)   Swelling of hand 08/31/2014   RT HAND   Type II diabetes mellitus (HCC)    UTI (urinary tract infection) 08/31/2014    Past Surgical History:  Procedure Laterality Date   CARDIAC CATHETERIZATION N/A 03/13/2015   Procedure: Right/Left Heart Cath and Coronary Angiography;  Surgeon: Gordy Bergamo, MD;  Location: [facility] INVASIVE CV LAB;  Service: Cardiovascular;  Laterality: N/A;   CATARACT EXTRACTION W/ INTRAOCULAR LENS  IMPLANT, BILATERAL Bilateral 2006-2011   CESAREAN SECTION  1983   CHOLECYSTECTOMY  ~ 2002   COLONOSCOPY N/A 01/21/2013   Procedure: COLONOSCOPY;  Surgeon: Belvie JONETTA Just, MD;  Location: [facility] [place] ENDOSCOPY;  Service: Endoscopy;  Laterality: N/A;   EYE SURGERY Bilateral    multiple (01/18/2013)   PACEMAKER IMPLANT N/A 08/31/2018   Symptomatic bradycardia due to mobitz II second degree AV block, permanent afib/ atypical atrial flutter implanted by Dr Kelsie PRESCOTT PLANA REPAIR OF RETINAL DEATACHMENT Right    PARS PLANA VITRECTOMY Bilateral 2004-2006   several (01/18/2013)   REFRACTIVE SURGERY Bilateral    for stigmatism (01/18/2013)   REFRACTIVE SURGERY Left ~ 11/2012   to puff it up cause vision got hazy (01/18/2013)   RIGHT HEART CATH N/A  12/30/2018   Procedure: RIGHT HEART CATH;  Surgeon: Cherrie Toribio SAUNDERS, MD;  Location: [facility] INVASIVE CV LAB;  Service: Cardiovascular;  Laterality: N/A;   RIGHT/LEFT HEART CATH AND CORONARY ANGIOGRAPHY N/A 09/07/2023   Procedure: RIGHT/LEFT HEART CATH AND CORONARY ANGIOGRAPHY;  Surgeon: Wendel Lurena POUR, MD;  Location: [facility] INVASIVE CV LAB;  Service: Cardiovascular;  Laterality: N/A;   VENA CAVA FILTER PLACEMENT  2011?   Family History:  Family History  Problem Relation Age of Onset   Cerebral aneurysm Mother    Hypertension Father    Cerebral aneurysm Maternal Grandfather    Cerebral aneurysm Maternal Aunt    Cancer Maternal Uncle     Hospital Course:  Sanda Dejoy. Truluck is a 68 y/o female admitted to the  [facility] gero-psych unit. She has significant and complex PMH including MDD, anxiety, HTN, DM2, a-fib on Eliquis , DVT/PE with IVF filter, HFpEF with pulmonary HTN, CKD stage IV, chronic pain, and morbid obesity with a BMI of 56 on 2-3L of oxygen at baseline. She is DNR/DNI with limited additional interventions on hospice, also receiving palliative care. She initially presented to the ED on 11/02/23 following an intentional overdose on 40 oxycodone  pills (5 mg). She was found by her home nurse the next morning who called 911.  Detailed risk assessment is complete based on clinical exam and individual risk factors and acute suicide risk is low and acute violence risk is low.   On admission patient was continued on Celexa  40 mg.  Abilify  was added on 11/10/2023 for mood stabilization and augmentation of antidepressant effect.  Patient has multiple medical problems including open wounds on her bilateral lower extremities requiring vascular consult.  Patient had a arterial bleed that required an immediate consult from the vascular surgery that recommended nonweightbearing on both legs and patient became totally bedbound.  On 11/10/2023 patient started having nausea and had vomitings.  Hospitalist consult and rapid  response was called.  Patient was transferred to the medical floor for further care.   Currently, all modifiable risk of harm to self/harm to others have been addressed and patient is no longer appropriate for the acute inpatient setting and is able to continue treatment for mental health needs in the community with the supports as indicated below.  Patient is educated and verbalized understanding of discharge plan of care including medications, follow-up appointments, mental health resources and further crisis services in the community.  He is instructed to call 911 or present to the nearest emergency room should he experience any decompensation in mood, disturbance of bowel or return of suicidal/homicidal ideations.  Patient verbalizes understanding of this education and agrees to this plan of care  Physical Findings: AIMS:  , ,  ,  ,    CIWA:    COWS:        Psychiatric Specialty Exam:  Presentation  General Appearance:  Appropriate for Environment; Casual  Eye Contact: Fair  Speech: Clear and Coherent  Speech Volume: Decreased    Mood and Affect  Mood: Anxious  Affect: Appropriate   Thought Process  Thought Processes: Coherent  Descriptions of Associations:Intact  Orientation:Full (Time, Place and Person)  Thought Content:Logical  Hallucinations:Hallucinations: None  Ideas of Reference:None  Suicidal Thoughts:Suicidal Thoughts: No  Homicidal Thoughts:Homicidal Thoughts: No   Sensorium  Memory: Immediate Fair; Recent Fair; Remote Fair  Judgment: Fair  Insight: Fair   Art therapist  Concentration: Fair  Attention Span: Fair  Recall: [employer] of Knowledge: Fair  Language: Fair   Psychomotor Activity  Psychomotor Activity: Psychomotor Activity: Normal  Musculoskeletal: Strength & Muscle Tone: within normal limits Gait & Station: normal Assets  Assets: Manufacturing systems engineer; Desire for Improvement;  Resilience   Sleep  Sleep: Sleep: Fair    Physical Exam: Physical Exam Vitals and nursing note reviewed.    ROS Blood pressure (!) 163/60, pulse (!) 50, temperature 98.2 F (36.8 C), resp. rate 20, height 5' 1 (1.549 m), weight 136.1 kg, SpO2 97%. Body mass index is 56.68 kg/m.   Social History   Tobacco Use  Smoking Status Former   Current packs/day: 0.00   Average packs/day: 0.1 packs/day for 30.0 years (1.5 ttl pk-yrs)   Types: Cigarettes   Start date: 48   Quit date: 50   Years  since quitting: 35.6  Smokeless Tobacco Never  Tobacco Comments   01/19/2013 quit smoking cigarettes in the early '90's   Tobacco Cessation:  N/A, patient does not currently use tobacco products   Blood Alcohol level:  Lab Results  Component Value Date   [facility] <15 11/02/2023    Metabolic Disorder Labs:  Lab Results  Component Value Date   HGBA1C 5.7 (H) 09/04/2023   MPG 116.89 09/04/2023   MPG 125.5 09/09/2021   No results found for: PROLACTIN Lab Results  Component Value Date   CHOL 105 09/03/2023   TRIG 56 09/03/2023   HDL 50 09/03/2023   CHOLHDL 2.1 09/03/2023   VLDL 11 09/03/2023   LDLCALC 44 09/03/2023   LDLCALC 48 08/30/2018    See Psychiatric Specialty Exam and Suicide Risk Assessment completed by Attending Physician prior to discharge.  Discharge destination:  Other:  medical floor  Is patient on multiple antipsychotic therapies at discharge:  No   Has Patient had three or more failed trials of antipsychotic monotherapy by history:  No  Recommended Plan for Multiple Antipsychotic Therapies: NA   Allergies as of 11/11/2023       Reactions   Ms Contin [morphine] Hives, Rash, Other (See Comments)   Broke out in brown spots all over.         Medication List     ASK your doctor about these medications      Indication  allopurinol  100 MG tablet Commonly known as: ZYLOPRIM  Take 50 mg by mouth every other day.    amLODipine  5 MG  tablet Commonly known as: NORVASC  Take 2 tablets (10 mg total) by mouth daily.    Centrum Silver Chew Chew 2 each by mouth daily.    citalopram  40 MG tablet Commonly known as: CELEXA  Take 40 mg by mouth at bedtime.    diazepam  5 MG tablet Commonly known as: VALIUM  Take 1 tablet (5 mg total) by mouth 2 (two) times daily as needed for anxiety or muscle spasms.    diphenhydramine -acetaminophen  25-500 MG Tabs tablet Commonly known as: TYLENOL  PM Take 1 tablet by mouth at bedtime.    Eliquis  5 MG Tabs tablet Generic drug: apixaban  Take 5 mg by mouth 2 (two) times daily.    ferrous sulfate  325 (65 FE) MG EC tablet Take 325 mg by mouth daily.    guaiFENesin -dextromethorphan  100-10 MG/5ML syrup Commonly known as: ROBITUSSIN DM Take by mouth in the morning, at noon, and at bedtime.    HAIR SKIN & NAILS GUMMIES PO Take 1 each by mouth daily.    insulin  aspart 100 UNIT/ML injection Commonly known as: novoLOG  Inject 6 Units into the skin 3 (three) times daily with meals.    Lantus  SoloStar 100 UNIT/ML Solostar Pen Generic drug: insulin  glargine Inject 20 Units into the skin daily.    Melatonin 10 MG Tabs Take 10 mg by mouth.    oxyCODONE  5 MG immediate release tablet Commonly known as: Oxy IR/ROXICODONE  Take 5 mg by mouth every 6 (six) hours as needed for severe pain (pain score 7-10).    rosuvastatin  10 MG tablet Commonly known as: CRESTOR  Take 1 tablet (10 mg total) by mouth daily.    Synthroid  75 MCG tablet Generic drug: levothyroxine  Take 75 mcg by mouth daily before breakfast. PATIENT ONLY TAKES NAME BRAND    torsemide  20 MG tablet Commonly known as: DEMADEX  Take 2 tablets (40mg ) by mouth twice daily, in the morning and at 4:00pm  Follow-up recommendations:  Activity:  As tolerated    Signed: Anelly Samarin, MD 11/11/2023, 1:11 PM

## 2023-11-11 NOTE — Hospital Course (Addendum)
 Hospital course / significant events:   HPI: [name] is a 68 year old female with pulmonary hypertension, hypertension, atrial fibrillation on Eliquis , history of DVT/PE with IVC, heart failure preserved ejection fraction, morbid obesity, CKD stage IV, chronic respiratory failure requiring 2 L nasal cannula at baseline, anxiety, insulin -dependent diabetes mellitus, who presents emergency department on 11/02/2023 for chief concerns of intentional overdose of OxyContin  on 11/01/2023.  08/01: admitted to behavioral health service on 11/06/2023 for the overdose attempt / SI. 08/06: hospitalist consulted for intractable N/V. Vitals at the time of hospitalist consultation showed temperature of 98.2, respiration rate 20, heart rate 50, blood pressure 163/60, SpO2 of 97% on 2 L nasal cannula. She also complained of constipation for about 2 weeks. Patient was also found to have weeping edema from the abdomen and weeping edema from left leg with bloody drainage soaking through left Unna boot. Admitted to hospitalist  08/07: palliative care consulted to clarify code status. DNR felt appropriate. Psychiatry also saw patient, no concerns on their end - continue Celexa  and Abilify , no psychiatric contraindications to discharge at this time  08/08: weeping edema from abdomen. Left Unna boot with visible oozing blood soaking through. Started IV Lasix  + albumin .  08/09-08/17: planning DC to SNF, placement pending and has been difficult to secure  08/18: psychiatry reevaluation - pt is cleared from their standpoint and no need for psychiatric admission.  08/19-08/22: placement pending, bed offer was withdrawn from planned facility  08/23: bedisde I&D by hospitalist for abscess R axilla 08/24: ok for placement [facility] pending auth  08/25: pt reporting a bit more soreness today in R axilla - US  to evaluate in case gen surg needs to do more extensive I&D --> US  concerning for abscess, gen surg notified and Dr  Desiderio will evaluate / possible to OR tomorrow if needed  08/26: to OR tomorrow for I&D, surgery to recheck tm. Foley out today, void trial. Anticipate dc tomorrow to facility      Consultants:  Psychiatry Palliative Care Nephrology   Procedures/Surgeries: 08/23: bedside I&D R axillary abscess  08/26: deeper I&D and irrigation in OR R axillary abscess      ASSESSMENT & PLAN:   Acute on Chronic HFpEF - improved  Recent echo 08/2023 noted EF 60-65%, moderate LVH, normal RV. Recent R/LHC noted no significant CAD, PA pressure 62/16, wedge of 11, PVR 4.4, group 3 pulmonary hypertension suspected Assoc w/ anasarca, ascites, peripheral edema Diuresis - caution w/ renal function Monitor BMP Monitor daily weight Torsemide     CKD (chronic kidney disease) stage 4, GFR 15-29 ml/min Baseline creatinine around 1.6-2  sCr 1.73>>2.21>>2.31 Monitor BMP with diuresis Nephrology - follow outpatient    R axillary abscess, mild, not resolving/worsening 08/25 Question abscess extension deeper versus enlarged lymph node I&D in OR 12/01/23  Bactrim    Chronic Lymphedema Venous stasis ulcers of left lower extremity  She had weeping edema (bloody drainage) from left leg. Continue Unna boots on bilateral lower extremities.Pt will most likely need these chronically as edema / skin weeping recurs with each removal   Intractable nausea and vomiting - resolved Suspect due to severe constipation, had not had BM in 2 weeks. Bowel regimen antiemetics as needed Monitor BMP Continue PPI Of note, iron  supplement potentially causing N/V, pt has done fine w/ switch to Niferex so will continue this    Type 2 diabetes mellitus with long-term current use of insulin  Continue insulin  per orders   Paroxysmal atrial fibrillation, bradycardia HR in the 50s  not on rate control drugs Continue Eliquis   Hx UTI, Urine culture from 05/05/2023: Showed Enterobacter cloacae, pansensitive UA with only trace  leukocytes, rare bacteria, 0-5 WBC's -- not consistent with UTI. Prior to admission patient was started on Keflex  so completed course for total 5 days. Recheck as needed  Hyperkalemia Lokelma  given on 8/14.  Resolved Monitor BMP    Hypothyroid TSH 14.9 elevated, increased dose of Synthroid  from 75 to 100 mcg p.o. daily 8/15 Follow-up outpatient to repeat TSH level after 6 weeks.   Drug overdose, intentional, initial encounter  Major depressive disorder, anxiety Continue Celexa  and Abilify  Pt was initially admitted to medical floor from geropsych unit. One-on-one suicide precautions were lifted per psychiatry on 11/12/23. Per psychiatry, pt can be discharged when medically stable, no longer requires psych admission.   OHS/OSA Pulmonary HTN type III Doesn't tolerate CPAP Supplement O2 as needed Weight loss encouraged     History of DVT and PE s/p IVC filter  Eliquis    Hypertension Low diastolic BP Continue amlodipine  Daily vital signs   Anemia of chronic disease Iron  profile, B12 within normal range Monitor H&H/CBC Of note, iron  supplement was causing N/V, pt has done fine w/ switch to Niferex so will continue this    Intertrigo, abdominal folds Lotrisone  cream twice daily followed by nystatin  powder Applied Lotrisone  cream on bilateral lower extremities well for possible dermatitis associated with chronic venous stasis/lymphedema Continue Lotrisone  for at least 2-4 weeks    Super Morbid Obesity based on BMI: Body mass index is 58.73 kg/m.SABRA Significantly low or high BMI is associated with higher medical risk.  Underweight - under 18  overweight - 25 to 29 obese - 30 or more Class 1 obesity: BMI of 30.0 to 34 Class 2 obesity: BMI of 35.0 to 39 Class 3 obesity: BMI of 40.0 to 49 Super Morbid Obesity: BMI 50-59 Super-super Morbid Obesity: BMI 60+ Healthy nutrition and physical activity advised as adjunct to other disease management and risk reduction  treatments    DVT prophylaxis: eliquis  IV fluids: no continuous IV fluids  Nutrition: carb modified diet Central lines / other devices: none  Code Status: DNR ACP documentation reviewed: DNR order, MOST and advanced directive are on file in [facility]  [facility] needs: confirm placement  Medical barriers to dispo: surgery to recheck axillary I&D site tm and hopefully can dc then.

## 2023-11-11 NOTE — Assessment & Plan Note (Addendum)
 Patient with where she has been vomiting since afternoon of 11/12/2023, all throughout the night, and into the morning She reports she has not been tolerating p.o. intake since afternoon of 11/12/23 Strict I's and O's Etiology workup in progress Portable chest x-ray ordered Check CMP, CBC with differential, UA Aspiration, fall precaution

## 2023-11-11 NOTE — Assessment & Plan Note (Signed)
 One-on-one suicide precaution monitoring

## 2023-11-11 NOTE — Group Note (Signed)
 Physical/Occupational Therapy Group Note  Group Topic: Yoga  Group Date: 11/11/2023 Start Time: 1300 End Time: 1330 Facilitators: Yanette Tripoli, Alm Hamilton, PT   Group Description: Group participated with series of yoga poses, designed to emphasize functional sitting balance, core stability, generalized flexibility and overall posture.  Incorporated deep breathing techniques with poses, working to promote relaxation, mindfulness and focus with targeted activities.   Discussed benefits of yoga in improving mood and self-esteem, reducing stress and anxiety, and promoting functional strength and balance for each participant.  Discussed ways to integrate into each participant's daily routine.  Provided handout with written and pictorial descriptions of included yoga movements to be utilized as appropriate outside of group time.  Therapeutic Goal(s):  Demonstrate safe ability to participate with yoga poses during group activity. Identify one benefit of participation with yoga poses as part of each participant's exercise/movement routine. Identify 1-2 individual poses that participant feels most beneficial to his/her needs and that he/she can easily replicate outside of group.  Individual Participation: Did not attend  Participation Level:   Participation Quality:   Behavior:   Speech/Thought Process:   Affect/Mood:   Insight:   Judgement:   Modes of Intervention:   Patient Response to Interventions:    Plan: Continue to engage patient in PT/OT groups 1 - 2x/week.  CHARM Hamilton Bertin PT, DPT 11/11/23, 1:53 PM

## 2023-11-11 NOTE — Plan of Care (Signed)
  Problem: Education: Goal: Ability to make informed decisions regarding treatment will improve Outcome: Progressing   Problem: Coping: Goal: Coping ability will improve Outcome: Progressing   Problem: Medication: Goal: Compliance with prescribed medication regimen will improve Outcome: Progressing   Problem: Health Behavior/Discharge Planning: Goal: Ability to manage health-related needs will improve Outcome: Progressing

## 2023-11-11 NOTE — Assessment & Plan Note (Signed)
 Home apixaban  5 mg p.o. twice daily resume

## 2023-11-11 NOTE — Progress Notes (Signed)
 OT Cancellation Note  Patient Details Name: [name] MRN: [id number] DOB: 12/04/1955   Cancelled Treatment:    Reason Eval/Treat Not Completed: Medical issues which prohibited therapy. Pt with rapid response called this morning. Plans to transfer to medical unit. OT to complete orders and await new orders when appropriate.   Izetta Claude, MS, OTR/L , CBIS ascom [phone]  11/11/23, 1:57 PM

## 2023-11-11 NOTE — Group Note (Unsigned)
 Date:  11/11/2023 Time:  3:58 PM  Group Topic/Focus:  Self Care:   The focus of this group is to help patients understand the importance of self-care in order to improve or restore emotional, physical, spiritual, interpersonal, and financial health.     Participation Level:  {BHH PARTICIPATION OZCZO:77735}  Participation Quality:  {BHH PARTICIPATION QUALITY:22265}  Affect:  {BHH AFFECT:22266}  Cognitive:  {BHH COGNITIVE:22267}  Insight: {BHH Insight2:20797}  Engagement in Group:  {BHH ENGAGEMENT IN HMNLE:77731}  Modes of Intervention:  {BHH MODES OF INTERVENTION:22269}  Additional Comments:  ***  Harlene LITTIE Gavel 11/11/2023, 3:58 PM

## 2023-11-11 NOTE — Significant Event (Signed)
 Rapid Response Event Note   Reason for Call :  Concerns with patient remaining in geropsych  Initial Focused Assessment:  Rapid response team arrived in patient's room with patient sitting in armchair surrounded by behavioral health staff. Patient alert and able to converse without difficulty. Alert and oriented x4. Patient's chief complaints of kidney pain and continued nausea and vomiting. Staff reports patient unable to keep medications and food down.  Last CBG 153. Vital signs with HR 49-50 heart rate regular, temperature 98.2 with forehead probe, BP 163/60 MAP 89, RR 20, oxygen saturations 100% on chronic 2L nasal cannula. Lungs clear upper and diminished lower to auscultation. Patient with wounds on bilateral lower extremities wrapped per [facility] RNs and being followed by vascular surgery. Abdomen tender to palpation.  Interventions:  None.   Plan of Care:  House Supervisor assisting behavioral health staff in getting hospitalist consult for management of patient's medical concerns. Behavioral health staff instructed to reach back out to rapid response team if any further needs.  Event Summary:   Call Time: 11:53 Arrival Time: 11:55 End Time: 12:14  GEORGINA LAURAINE NORRIS, RN

## 2023-11-11 NOTE — Progress Notes (Signed)
   11/11/23 1215  Spiritual Encounters  Type of Visit Initial  Care provided to: Patient  Conversation partners present during encounter Nurse  Reason for visit Code  OnCall Visit No   Unit Chaplain responded to a RRT for this patient on the [place] [facility] that the On-Call Chaplain shared.  Chaplain spoke with patient after she was stabilized by staff.  Patient shared some grief regarding familial relationships and health concerns.  Patient shared some decisions she'd made in the past that she regrets.  Patient shared that the thing she'd like the most is to be able to [place].  Chaplain offered a compassionate presence and reflective listening.  Chaplain offered the patient prayer, she accepted and patient prayed.    Rev. Rana M. Nicholaus, M.Div. Chaplain Resident  [facility]

## 2023-11-11 NOTE — Assessment & Plan Note (Signed)
 Home long-acting insulin  equivalent 20 units daily resumed Insulin  SSI with at bedtime coverage ordered Goal inpatient blood glucose levels 140-180

## 2023-11-11 NOTE — Progress Notes (Signed)
 Paged the hospitalist service about rapid response 12:30 and receptionist stated its been 10 min that they paged the hospitalist and this providers cell phone has been provided and they will page again

## 2023-11-11 NOTE — H&P (Addendum)
 History and Physical   [name] FMW:[id number] DOB: 1955/10/05 DOA: 11/11/2023  PCP: Valma Carwin, MD  Patient coming from: [facility]  I have personally briefly reviewed patient's old medical records in [facility] EMR.  Chief Concern: Intractable nausea and vomiting  HPI: [name] is a 68 year old female with pulmonary hypertension, hypertension, atrial fibrillation on Eliquis , history of DVT/PE with IVC, heart failure preserved ejection fraction, morbid obesity, CKD stage IV, chronic respiratory failure requiring 2 L nasal cannula at baseline, anxiety, insulin -dependent diabetes mellitus, who presents emergency department on 11/02/2023 for chief concerns of intentional overdose of OxyContin  on 11/01/2023.  Patient was admitted to behavioral health service on 11/06/2023 for the same.  Hospitalist service was consulted on 11/11/2023 for chief concerns of intractable nausea and vomiting.  Most recent vitals at the time of hospitalist consultation showed temperature of 98.2, respiration rate 20, heart rate 50, blood pressure 163/60, SpO2 of 97% on 2 L nasal cannula.  The only labs available to me at the time of hospitalist consultation was glucose capillary value of 153. ------------------------------------- At bedside, patient was able to tell me her first and last name, age, and current location. Patient is resting comfortably in a chair with her oxygen at 2 L nasal cannula without distress.  Patient was able to complete full history and physical and answered all my questions without discomfort, signs of distress, and/or difficulty speaking.  She was able to complete full sentences.  She reports that she has started vomiting since yesterday evening, she continued to vomit all night and into this morning.  She states that she has not been able to tolerate any p.o. intake since last evening.  Patient endorses polyuria that has been ongoing for the last 2 days.  She denies  dysuria.  She reports shortness of breath with ambulation to the restroom as she urinated.  She reports that this has been her baseline shortness of breath status for several months now.  She denies chest pain, abdominal pain, fever, chills.  She endorses cough of productive clear sputum.  She denies vomitus of bright red blood.  She reports that her vomitus is clear fluid, sputum in color.  ROS: Constitutional: no weight change, no fever ENT/Mouth: no sore throat, no rhinorrhea Eyes: no eye pain, no vision changes Cardiovascular: no chest pain, + dyspnea,  no edema, no palpitations Respiratory: + cough, + sputum, no wheezing Gastrointestinal: + nausea, + vomiting, no diarrhea, no constipation Genitourinary: no urinary incontinence, no dysuria, no hematuria Musculoskeletal: no arthralgias, no myalgias Skin: no skin lesions, no pruritus, Neuro: + weakness, no loss of consciousness, no syncope Psych: no anxiety, no depression, + decrease appetite Heme/Lymph: no bruising, no bleeding  Assessment/Plan  Principal Problem:   Intractable nausea and vomiting Active Problems:   Polyuria   Essential hypertension   Paroxysmal atrial fibrillation (HCC)   Type 2 diabetes mellitus with stage 3b chronic kidney disease, with long-term current use of insulin  (HCC)   (HFpEF) heart failure with preserved ejection fraction (HCC)   Morbid obesity with BMI of 50.0-59.9, adult (HCC)   LBBB (left bundle branch block)   Hypothyroidism   Pulmonary HTN, Moderate  PA pressure 09/2009   Thrombocytopenia (HCC)   Drug overdose, intentional, initial encounter (HCC)   CKD (chronic kidney disease) stage 4, GFR 15-29 ml/min (HCC)   Assessment and Plan:  * Intractable nausea and vomiting Patient with where she has been vomiting since afternoon of 11/12/2023, all throughout the night, and  into the morning She reports she has not been tolerating p.o. intake since afternoon of 11/12/23 Strict I's and  O's Etiology workup in progress Portable chest x-ray ordered Check CMP, CBC with differential, UA Aspiration, fall precaution  Polyuria Check a UA Urine culture from 05/05/2023: Showed Enterobacter cloacae, pansensitive Patient received Keflex  500 mg p.o. twice daily, note stated that patient received 3 days however patient only completed 5 doses total (2.5 days) of treatment 11/05/2023: First dose 2158; 2 doses were received on 11/06/2023; 2 doses were received on 11/07/2023 Strict I's and O's  NOTE: If repeat UA is positive for nitrates, leukocytes, can give patient 3 full days of nitrofurantoin PO 100 mg PO BID.   (HFpEF) heart failure with preserved ejection fraction (HCC) Strict I's and O's Does not appear to be in acute exacerbation Home torsemide  40 mg p.o. twice daily home dosing resumed on admission  Type 2 diabetes mellitus with stage 3b chronic kidney disease, with long-term current use of insulin  (HCC) Home long-acting insulin  equivalent 20 units daily resumed Insulin  SSI with at bedtime coverage ordered Goal inpatient blood glucose levels 140-180  Paroxysmal atrial fibrillation (HCC) Home apixaban  5 mg p.o. twice daily resume  Essential hypertension Home amlodipine  10 mg daily resumed Hydralazine  5 mg IV every 6 hours as needed for SBP greater 165, 5 days ordered  Hypothyroidism Home levothyroxine  75 mcg daily before breakfast resumed  Morbid obesity with BMI of 50.0-59.9, adult (HCC) This complicates overall care and prognosis.   CKD (chronic kidney disease) stage 4, GFR 15-29 ml/min (HCC) Currently better than baseline at CKD 3B on admission labs  Drug overdose, intentional, initial encounter [facility]) One-on-one suicide precaution monitoring  Chart reviewed.   Complete echo on 09/04/2023: Estimated ejection fraction 60 to 65%  DVT prophylaxis: Eliquis  Code Status: DNR/DNI, ACP reviewed, MOST form reviewed Diet: N.p.o. as patient has intractable nausea and  vomiting Family Communication: No Disposition Plan: Pending clinical course Consults called: None at this Admission status: Telemetry medical, inpatient  Past Medical History:  Diagnosis Date   A-fib (HCC)    Anemia    Anticoagulated on Coumadin , chronically 09/03/2011   Anxiety    Arthritis    right hip; both knees; left wrist/shoulder; back (01/19/2013   Bleeding on Coumadin  08/2012; 01/18/2013   [facility] admissions (01/19/2013)   CHF (congestive heart failure) (HCC)    2-3 times (01/19/2013)   Chronic lower back pain    Depression    DVT (deep venous thrombosis) (HCC) 10 years ago   numerous/notes 01/18/2013   GERD (gastroesophageal reflux disease)    Gout    Headache(784.0)    maybe weekly (01/19/2013)   Heart murmur    High cholesterol    been off RX for this at one time (01/18/2013)   History of blood transfusion 1983; 04/2012   3 w/ childbirth; hospitalized for pain (01/19/2013)   Hypertension    Hypothyroidism    Migraines    twice/yr maybe (01/19/2013)   Obstructive sleep apnea 05/03/2012   OSA (obstructive sleep apnea)    sent me for test in 04/2012; never ordered mask, etc (01/19/2013)   PE (pulmonary thromboembolism) (HCC) 3 years ag0   3/notes 01/18/2013   Pneumonia before 2011   once' (01/18/2013)   Renal disorder    kindey function low; Metformin was destroying my kidneys (01/19/2013)   Shortness of breath    only related to my CHF (01/18/2013)   Swelling of hand 08/31/2014   RT HAND  Type II diabetes mellitus (HCC)    UTI (urinary tract infection) 08/31/2014   Past Surgical History:  Procedure Laterality Date   CARDIAC CATHETERIZATION N/A 03/13/2015   Procedure: Right/Left Heart Cath and Coronary Angiography;  Surgeon: Gordy Bergamo, MD;  Location: [facility] INVASIVE CV LAB;  Service: Cardiovascular;  Laterality: N/A;   CATARACT EXTRACTION W/ INTRAOCULAR LENS  IMPLANT, BILATERAL Bilateral 2006-2011   CESAREAN SECTION  1983   CHOLECYSTECTOMY  ~  2002   COLONOSCOPY N/A 01/21/2013   Procedure: COLONOSCOPY;  Surgeon: Belvie JONETTA Just, MD;  Location: [facility] ENDOSCOPY;  Service: Endoscopy;  Laterality: N/A;   EYE SURGERY Bilateral    multiple (01/18/2013)   PACEMAKER IMPLANT N/A 08/31/2018   Symptomatic bradycardia due to mobitz II second degree AV block, permanent afib/ atypical atrial flutter implanted by Dr Kelsie PRESCOTT PLANA REPAIR OF RETINAL DEATACHMENT Right    PARS PLANA VITRECTOMY Bilateral 2004-2006   several (01/18/2013)   REFRACTIVE SURGERY Bilateral    for stigmatism (01/18/2013)   REFRACTIVE SURGERY Left ~ 11/2012   to puff it up cause vision got hazy (01/18/2013)   RIGHT HEART CATH N/A 12/30/2018   Procedure: RIGHT HEART CATH;  Surgeon: Cherrie Toribio SAUNDERS, MD;  Location: [facility] INVASIVE CV LAB;  Service: Cardiovascular;  Laterality: N/A;   RIGHT/LEFT HEART CATH AND CORONARY ANGIOGRAPHY N/A 09/07/2023   Procedure: RIGHT/LEFT HEART CATH AND CORONARY ANGIOGRAPHY;  Surgeon: Wendel Lurena POUR, MD;  Location: [facility] INVASIVE CV LAB;  Service: Cardiovascular;  Laterality: N/A;   VENA CAVA FILTER PLACEMENT  2011?   Social History:  reports that she quit smoking about 35 years ago. Her smoking use included cigarettes. She started smoking about 65 years ago. She has a 1.5 pack-year smoking history. She has never used smokeless tobacco. She reports that she does not currently use alcohol. She reports that she does not use drugs.  Allergies  Allergen Reactions   Ms Contin [Morphine] Hives, Rash and Other (See Comments)    Broke out in brown spots all over.    Family History  Problem Relation Age of Onset   Cerebral aneurysm Mother    Hypertension Father    Cerebral aneurysm Maternal Grandfather    Cerebral aneurysm Maternal Aunt    Cancer Maternal Uncle    Family history: Family history reviewed and not pertinent.  Prior to Admission medications   Medication Sig Start Date End Date Taking? Authorizing Provider  allopurinol   (ZYLOPRIM ) 100 MG tablet Take 50 mg by mouth every other day. 10/28/23   [provider]  amLODipine  (NORVASC ) 5 MG tablet Take 2 tablets (10 mg total) by mouth daily. 10/26/23   Milford, Harlene HERO, FNP  apixaban  (ELIQUIS ) 5 MG TABS tablet Take 5 mg by mouth 2 (two) times daily.    [provider]  Biotin  w/ Vitamins C & E (HAIR SKIN & NAILS GUMMIES PO) Take 1 each by mouth daily.    [provider]  citalopram  (CELEXA ) 40 MG tablet Take 40 mg by mouth at bedtime. 10/28/23   [provider]  diazepam  (VALIUM ) 5 MG tablet Take 1 tablet (5 mg total) by mouth 2 (two) times daily as needed for anxiety or muscle spasms. 09/16/23   Arrien, Mauricio Daniel, MD  diphenhydramine -acetaminophen  (TYLENOL  PM) 25-500 MG TABS tablet Take 1 tablet by mouth at bedtime.    [provider]  ferrous sulfate  325 (65 FE) MG EC tablet Take 325 mg by mouth daily.    [provider]  guaiFENesin -dextromethorphan  (ROBITUSSIN DM) 100-10 MG/5ML syrup Take by mouth in the morning, at noon, and at bedtime. 10/28/23   [provider]  insulin  aspart (NOVOLOG ) 100 UNIT/ML injection Inject 6 Units into the skin 3 (three) times daily with meals. 09/15/23   Fairy Frames, MD  insulin  glargine (LANTUS  SOLOSTAR) 100 UNIT/ML Solostar Pen Inject 20 Units into the skin daily.    [provider]  levothyroxine  (SYNTHROID ) 75 MCG tablet Take 75 mcg by mouth daily before breakfast. PATIENT ONLY TAKES NAME BRAND    [provider]  Melatonin 10 MG TABS Take 10 mg by mouth.    [provider]  Multiple Vitamins-Minerals (CENTRUM SILVER) CHEW Chew 2 each by mouth daily.    [provider]  oxyCODONE  (OXY IR/ROXICODONE ) 5 MG immediate release tablet Take 5 mg by mouth every 6 (six) hours as needed for severe pain (pain score 7-10). 10/28/23   [provider]  rosuvastatin  (CRESTOR ) 10 MG tablet Take 1 tablet (10 mg total) by mouth daily. 05/09/16    Epifanio Rolland Robin, MD  torsemide  (DEMADEX ) 20 MG tablet Take 2 tablets (40mg ) by mouth twice daily, in the morning and at 4:00pm 09/08/23   Danford, Lonni SQUIBB, MD   Physical Exam: Vitals:   11/11/23 1609  Height: 5' 1 (1.549 m)   T 97.9, RR 20, HR 50, BP 142/53, spO2 99% on 2 L [place] Walk  Constitutional: appears older than chronological age, NAD, calm Eyes: PERRL, lids and conjunctivae normal ENMT: Mucous membranes are moist. Posterior pharynx clear of any exudate or lesions. Age-appropriate dentition. Hearing appropriate Neck: normal, supple, no masses, no thyromegaly Respiratory: clear to auscultation bilaterally, no wheezing, no crackles. Normal respiratory effort. No accessory muscle use.  Cardiovascular: Regular rate and rhythm, no murmurs / rubs / gallops. No extremity edema. 2+ pedal pulses. No carotid bruits.  Abdomen: Morbidly obese abdomen, no tenderness, no masses palpated, no hepatosplenomegaly. Bowel sounds positive.  Musculoskeletal: no clubbing / cyanosis. No joint deformity upper and lower extremities. Good ROM, no contractures, no atrophy. Normal muscle tone.  Skin: no rashes, lesions, ulcers. No induration Neurologic: Sensation intact. Strength 5/5 in all 4.  Psychiatric: Normal judgment and insight. Alert and oriented x 3. Normal mood.   EKG: Not indicated at this time  Chest x-ray on Admission: I personally reviewed and I agree with radiologist reading as below.  DG Chest Port 1 View Result Date: 11/11/2023 CLINICAL DATA:  Intractable nausea and vomiting EXAM: PORTABLE CHEST 1 VIEW COMPARISON:  11/02/2023 FINDINGS: Single frontal view of the chest demonstrates stable single lead pacer. Cardiac silhouette remains enlarged, with marked dilation of the pulmonary artery again seen consistent with pulmonary arterial hypertension. Stable calcification of the mitral annulus. Chronic central pulmonary vascular congestion with no acute airspace disease, effusion, or  pneumothorax. IMPRESSION: 1. Stable enlarged cardiac silhouette and chronic central pulmonary vascular congestion. No overt edema. Electronically Signed   By: Ozell Daring M.D.   On: 11/11/2023 14:50   Labs on Admission: I have personally reviewed following labs  CBC: Recent Labs  Lab 11/08/23 1653 11/11/23 1414 11/11/23 1503  WBC 7.9 8.7 7.4  NEUTROABS 6.8 7.6 6.4  HGB 9.0* 10.3* 9.7*  HCT 30.0* 33.9* 32.7*  MCV 107.9* 105.6* 105.8*  PLT 142* 154 139*   Basic Metabolic Panel: Recent Labs  Lab 11/11/23 1414 11/11/23 1503  NA 141 142  K 4.7 4.7  CL 99 100  CO2 30 33*  GLUCOSE 183* 183*  BUN 52*  50*  CREATININE 1.46* 1.48*  CALCIUM  9.5 9.3   GFR: Estimated Creatinine Clearance: 48.4 mL/min (A) (by C-G formula based on SCr of 1.48 mg/dL (H)).  Liver Function Tests: Recent Labs  Lab 11/11/23 1414 11/11/23 1503  AST 22 21  ALT 14 14  ALKPHOS 59 55  BILITOT 0.9 1.0  PROT 6.4* 6.2*  ALBUMIN  3.7 3.4*   CBG: Recent Labs  Lab 11/10/23 1121 11/10/23 1609 11/10/23 2008 11/11/23 0752 11/11/23 1130  GLUCAP 151* 140* 142* 174* 153*   Urine analysis:    Component Value Date/Time   COLORURINE AMBER (A) 11/02/2023 1341   APPEARANCEUR TURBID (A) 11/02/2023 1341   LABSPEC 1.012 11/02/2023 1341   PHURINE 7.0 11/02/2023 1341   GLUCOSEU NEGATIVE 11/02/2023 1341   HGBUR SMALL (A) 11/02/2023 1341   BILIRUBINUR NEGATIVE 11/02/2023 1341   KETONESUR NEGATIVE 11/02/2023 1341   PROTEINUR 100 (A) 11/02/2023 1341   UROBILINOGEN 0.2 08/31/2014 1158   NITRITE NEGATIVE 11/02/2023 1341   LEUKOCYTESUR LARGE (A) 11/02/2023 1341   This document was prepared using Dragon Voice Recognition software and may include unintentional dictation errors.  Dr. Sherre [facility]  If 7PM-7AM, please contact overnight-coverage provider If 7AM-7PM, please contact day attending provider [email or web address]  11/11/2023, 4:23 PM

## 2023-11-11 NOTE — Progress Notes (Signed)
  [facility] Adult Case Management Discharge Plan :  Will you be returning to the same living situation after discharge:  No. Pt admitted to medical floor  At discharge, do you have transportation home?: Yes,  Medical to floor to assist  Do you have the ability to pay for your medications: Yes,  AETNA MEDICARE / AETNA MEDICARE HMO/PPO  Release of information consent forms completed and in the chart;  Patient's signature needed at discharge.  Patient to Follow up at:  Follow-up Information     [facility] Follow up.   Why: This service is available on a walk-in or call-in basis Monday through Friday, from 8:00 AM to 3:00 PM. The last walk-in will be accepted at 3:00 PM. Contact information: [address]  [address] [place] [address] [phone]                 Next level of care provider has access to [facility] Link:no  Safety Planning and Suicide Prevention discussed: Yes,  SPE gone over with pt     Has patient been referred to the Quitline?: Patient does not use tobacco/nicotine products  Patient has been referred for addiction treatment: No known substance use disorder.  [address], LCSWA 11/11/2023, 3:56 PM

## 2023-11-11 NOTE — Progress Notes (Signed)
 Arrived to overhead page of rapid response. On arrival, pt awake, able to answer questions, maintaining airway, has pulse. Was told by staff that pt had been vomiting for several days, is a diabetic with open wounds. Stated Dr. Jadapalle said a consult had been placed. Did not see note from hospitalist. /explained process, appeared we did not have a consult set up with hospitalist. Called consult [email or web address] . Got call back from [facility] One Call Consult line. Explained [id number]. Stated they would set up a consult. Staff gave contact information for Dr. Jadapalle. Notified pt placement, we may need to transfer pt to medical floor. Was told could not move forward until pt seen by hospitalist.

## 2023-11-11 NOTE — Assessment & Plan Note (Addendum)
 Strict I's and O's Does not appear to be in acute exacerbation Home torsemide  40 mg p.o. twice daily home dosing resumed on admission

## 2023-11-12 DIAGNOSIS — F321 Major depressive disorder, single episode, moderate: Secondary | ICD-10-CM

## 2023-11-12 DIAGNOSIS — E1122 Type 2 diabetes mellitus with diabetic chronic kidney disease: Secondary | ICD-10-CM | POA: Diagnosis not present

## 2023-11-12 DIAGNOSIS — Z794 Long term (current) use of insulin: Secondary | ICD-10-CM

## 2023-11-12 DIAGNOSIS — R112 Nausea with vomiting, unspecified: Secondary | ICD-10-CM | POA: Diagnosis not present

## 2023-11-12 DIAGNOSIS — N1832 Chronic kidney disease, stage 3b: Secondary | ICD-10-CM

## 2023-11-12 DIAGNOSIS — Z6841 Body Mass Index (BMI) 40.0 and over, adult: Secondary | ICD-10-CM

## 2023-11-12 DIAGNOSIS — Z515 Encounter for palliative care: Secondary | ICD-10-CM

## 2023-11-12 DIAGNOSIS — I5033 Acute on chronic diastolic (congestive) heart failure: Secondary | ICD-10-CM | POA: Diagnosis not present

## 2023-11-12 LAB — CBC
HCT: 27.9 % — ABNORMAL LOW (ref 36.0–46.0)
Hemoglobin: 8.4 g/dL — ABNORMAL LOW (ref 12.0–15.0)
MCH: 32.1 pg (ref 26.0–34.0)
MCHC: 30.1 g/dL (ref 30.0–36.0)
MCV: 106.5 fL — ABNORMAL HIGH (ref 80.0–100.0)
Platelets: 122 K/uL — ABNORMAL LOW (ref 150–400)
RBC: 2.62 MIL/uL — ABNORMAL LOW (ref 3.87–5.11)
RDW: 16 % — ABNORMAL HIGH (ref 11.5–15.5)
WBC: 7.3 K/uL (ref 4.0–10.5)
nRBC: 0 % (ref 0.0–0.2)

## 2023-11-12 LAB — BASIC METABOLIC PANEL WITH GFR
Anion gap: 7 (ref 5–15)
BUN: 52 mg/dL — ABNORMAL HIGH (ref 8–23)
CO2: 30 mmol/L (ref 22–32)
Calcium: 8.8 mg/dL — ABNORMAL LOW (ref 8.9–10.3)
Chloride: 103 mmol/L (ref 98–111)
Creatinine, Ser: 1.5 mg/dL — ABNORMAL HIGH (ref 0.44–1.00)
GFR, Estimated: 38 mL/min — ABNORMAL LOW (ref >60)
Glucose, Bld: 135 mg/dL — ABNORMAL HIGH (ref 70–99)
Potassium: 4.6 mmol/L (ref 3.5–5.1)
Sodium: 140 mmol/L (ref 135–145)

## 2023-11-12 LAB — URINALYSIS, COMPLETE (UACMP) WITH MICROSCOPIC
Bilirubin Urine: NEGATIVE
Glucose, UA: NEGATIVE mg/dL
Hgb urine dipstick: NEGATIVE
Ketones, ur: NEGATIVE mg/dL
Nitrite: NEGATIVE
Protein, ur: >=300 mg/dL — AB
Specific Gravity, Urine: 1.01 (ref 1.005–1.030)
pH: 5 (ref 5.0–8.0)

## 2023-11-12 LAB — GLUCOSE, CAPILLARY
Glucose-Capillary: 124 mg/dL — ABNORMAL HIGH (ref 70–99)
Glucose-Capillary: 202 mg/dL — ABNORMAL HIGH (ref 70–99)
Glucose-Capillary: 161 mg/dL — ABNORMAL HIGH (ref 70–99)
Glucose-Capillary: 198 mg/dL — ABNORMAL HIGH (ref 70–99)

## 2023-11-12 LAB — TSH: TSH: 14.995 u[IU]/mL — ABNORMAL HIGH (ref 0.350–4.500)

## 2023-11-12 MED ORDER — ARIPIPRAZOLE 2 MG PO TABS
2.0000 mg | ORAL_TABLET | Freq: Every day | ORAL | Status: DC
  Administered 2023-11-12 – 2023-12-05 (×27): 2 mg via ORAL
  Filled 2023-11-12 (×26): qty 1

## 2023-11-12 MED ORDER — FERROUS SULFATE 325 (65 FE) MG PO TABS
325.0000 mg | ORAL_TABLET | Freq: Every day | ORAL | Status: DC
  Administered 2023-11-12 – 2023-11-23 (×15): 325 mg via ORAL
  Filled 2023-11-12 (×12): qty 1

## 2023-11-12 MED ORDER — TORSEMIDE 20 MG PO TABS: 40.0000 mg | ORAL_TABLET | Freq: Two times a day (BID) | ORAL | Status: DC

## 2023-11-12 MED ORDER — MELATONIN 5 MG PO TABS: 10.0000 mg | ORAL_TABLET | Freq: Every day | ORAL | Status: DC

## 2023-11-12 MED ORDER — TRIPLE ANTIBIOTIC 3.5-400-5000 EX OINT: 1.0000 | TOPICAL_OINTMENT | Freq: Two times a day (BID) | CUTANEOUS | Status: DC | PRN

## 2023-11-12 MED ORDER — INSULIN ASPART 100 UNIT/ML IJ SOLN: 0.0000 [IU] | Freq: Three times a day (TID) | INTRAMUSCULAR | Status: DC

## 2023-11-12 MED ORDER — GUAIFENESIN-DM 100-10 MG/5ML PO SYRP: 5.0000 mL | ORAL_SOLUTION | Freq: Three times a day (TID) | ORAL | Status: DC | PRN

## 2023-11-12 MED ORDER — ALUM & MAG HYDROXIDE-SIMETH 200-200-20 MG/5ML PO SUSP: 30.0000 mL | ORAL | Status: DC | PRN

## 2023-11-12 MED ORDER — INSULIN ASPART 100 UNIT/ML IJ SOLN: 0.0000 [IU] | Freq: Every day | INTRAMUSCULAR | Status: DC

## 2023-11-12 MED ORDER — POLYETHYLENE GLYCOL 3350 17 G PO PACK
17.0000 g | PACK | Freq: Every day | ORAL | Status: DC
  Administered 2023-11-12 – 2023-12-04 (×16): 17 g via ORAL
  Filled 2023-11-12 (×23): qty 1

## 2023-11-12 MED ORDER — LEVOTHYROXINE SODIUM 50 MCG PO TABS
75.0000 ug | ORAL_TABLET | Freq: Every day | ORAL | Status: DC
  Administered 2023-11-12 – 2023-11-19 (×11): 75 ug via ORAL
  Filled 2023-11-12 (×8): qty 1

## 2023-11-12 MED ORDER — CITALOPRAM HYDROBROMIDE 20 MG PO TABS
40.0000 mg | ORAL_TABLET | Freq: Every day | ORAL | Status: DC
  Administered 2023-11-12 – 2023-12-05 (×27): 40 mg via ORAL
  Filled 2023-11-12 (×24): qty 2

## 2023-11-12 MED ORDER — OXYCODONE HCL 5 MG PO TABS
5.0000 mg | ORAL_TABLET | Freq: Four times a day (QID) | ORAL | Status: DC | PRN
  Administered 2023-11-27 – 2023-12-01 (×3): 5 mg via ORAL
  Filled 2023-11-12 (×4): qty 1

## 2023-11-12 MED ORDER — SENNOSIDES-DOCUSATE SODIUM 8.6-50 MG PO TABS
1.0000 | ORAL_TABLET | Freq: Two times a day (BID) | ORAL | Status: DC
  Administered 2023-11-12 – 2023-12-05 (×40): 1 via ORAL
  Filled 2023-11-12 (×45): qty 1

## 2023-11-12 MED ORDER — ORAL CARE MOUTH RINSE: 15.0000 mL | OROMUCOSAL | Status: DC | PRN

## 2023-11-12 MED ORDER — ADULT MULTIVITAMIN W/MINERALS CH
1.0000 | ORAL_TABLET | Freq: Every day | ORAL | Status: DC
  Administered 2023-11-12 – 2023-12-06 (×27): 1 via ORAL
  Filled 2023-11-12 (×26): qty 1

## 2023-11-12 MED ORDER — AMLODIPINE BESYLATE 10 MG PO TABS: 10.0000 mg | ORAL_TABLET | Freq: Every day | ORAL | Status: DC

## 2023-11-12 MED ORDER — DIAZEPAM 5 MG PO TABS: 5.0000 mg | ORAL_TABLET | Freq: Two times a day (BID) | ORAL | Status: DC | PRN

## 2023-11-12 MED ORDER — NYSTATIN 100000 UNIT/GM EX POWD
Freq: Three times a day (TID) | CUTANEOUS | Status: DC | PRN
  Filled 2023-11-12: qty 15

## 2023-11-12 MED ORDER — APIXABAN 5 MG PO TABS: 5.0000 mg | ORAL_TABLET | Freq: Two times a day (BID) | ORAL | Status: DC

## 2023-11-12 MED ORDER — ROSUVASTATIN CALCIUM 10 MG PO TABS
10.0000 mg | ORAL_TABLET | Freq: Every day | ORAL | Status: DC
  Administered 2023-11-12 – 2023-12-06 (×27): 10 mg via ORAL
  Filled 2023-11-12 (×26): qty 1

## 2023-11-12 MED ORDER — ALLOPURINOL 100 MG PO TABS: 50.0000 mg | ORAL_TABLET | ORAL | Status: DC

## 2023-11-12 NOTE — Care Management CC44 (Cosign Needed)
 Condition Code 44 Documentation Completed  Patient Details  Name: LYNNLEIGH SODEN MRN: [id number] Date of Birth: 08/02/1955   Condition Code 44 given:    Patient signature on Condition Code 44 notice:    Documentation of 2 MD's agreement:    Code 44 added to claim:       Dalia GORMAN Fuse, RN 11/12/2023, 3:37 PM

## 2023-11-12 NOTE — Progress Notes (Signed)
 Progress Note   Patient: [name] FMW:[id number] DOB: [birth year] DOA: 11/11/2023     1 DOS: the patient was seen and examined on 11/12/2023   Brief hospital course:   [name] is a 68 year old female with pulmonary hypertension, hypertension, atrial fibrillation on Eliquis , history of DVT/PE with IVC, heart failure preserved ejection fraction, morbid obesity, CKD stage IV, chronic respiratory failure requiring 2 L nasal cannula at baseline, anxiety, insulin -dependent diabetes mellitus, who presents emergency department on 11/02/2023 for chief concerns of intentional overdose of OxyContin  on 11/01/2023.   Patient was admitted to behavioral health service on 11/06/2023 for the same.   Hospitalist service was consulted on 11/11/2023 for chief concerns of intractable nausea and vomiting.... since the night before See H&P for full HPI on admission & ED course.   Patient was admitted for further evaluation and management.    8/7 -- pt tolerating PO intake without N/V today.  She reports it's been 2 weeks since a bowel movement and feels intermittent N/V due to constipation.  Bowel regimen initiated.   Assessment and Plan:  * Intractable nausea and vomiting - resolved Suspect due to severe constipation, no BM in 2 weeks. --Bowel regimen - escalate as needed --IV antiemetics as needed --Tolerating PO intake, no need for IV fluids --Monitor BMP  Polyuria UA with only trace leukocytes, rare bacteria, 0-5 WBC's -- not consistent with UTI. Urine culture from 05/05/2023: Showed Enterobacter cloacae, pansensitive Prior to admission, pt given Keflex  500 mg p.o. twice daily, note stated that patient received 3 days however patient only completed 5 doses total (2.5 days) of treatment 11/05/2023: First dose 2158; 2 doses were received on 11/06/2023; 2 doses were received on 11/07/2023 --Monitor urine output --Monitor fever curve and for dysuria   Chronic HFpEF - compensated clinically, noting  body habitus makes volume status assessment somewhat limited --Strict I's and O's --Home torsemide  40 mg PO BID continued  Type 2 diabetes mellitus with stage 3b chronic kidney disease, with long-term current use of insulin  (HCC) --Home long-acting insulin  equivalent 20 units daily resumed --Insulin  SSI with at bedtime coverage ordered -Goal inpatient blood glucose levels 140-180  Paroxysmal atrial fibrillation (HCC) --Home apixaban  5 mg p.o. twice daily resumed  Essential hypertension -Home amlodipine  10 mg daily resumed --Hydralazine  5 mg IV every 6 hours as needed for SBP greater 165, 5 days ordered  Hypothyroidism --Home levothyroxine  75 mcg daily before breakfast resumed  Morbid obesity with BMI of 50.0-59.9, adult (HCC) This complicates overall care and prognosis.   CKD (chronic kidney disease) stage 4, GFR 15-29 ml/min (HCC) Currently better than baseline at CKD 3B on admission labs  Drug overdose, intentional, initial encounter [facility]) One-on-one suicide precaution monitoring Psychiatry consulted - follow updated recommendations      Subjective: Pt seen awake resting in bed, sitter at bedside this AM.  PT reports having a small very hard BM this AM.  States it's been 14 days since a good BM and feels this is what is causing her N/V.  That is better today, ate breakfast without issue.  No other acute complaints.  Denies SI.  Physical Exam: Vitals:   11/11/23 2330 11/12/23 0434 11/12/23 0818 11/12/23 1159  BP: (!) 161/56 (!) 138/48 (!) 144/60 (!) 143/54  Pulse: (!) 52 (!) 49 (!) 50 (!) 50  Resp: 18  19 18   Temp: 98.1 F (36.7 C) 98.5 F (36.9 C) 98.5 F (36.9 C) 98.2 F (36.8 C)  TempSrc: Oral Oral Oral Oral  SpO2: 99% 97% 100% 99%  Weight:      Height:       General exam: awake, alert, no acute distress, obese HEENT: atraumatic, clear conjunctiva, anicteric sclera, moist mucus membranes, hearing grossly normal  Respiratory system: CTA, no wheezes, rales or  rhonchi, normal respiratory effort. Cardiovascular system: normal S1/S2, RRR  Gastrointestinal system: soft, NT, ND, hypoactive bowel sounds. Central nervous system: A&O x 3. no gross focal neurologic deficits, normal speech Extremities: moves all, no edema, normal tone Skin: dry, intact, normal temperature Psychiatry: normal mood, congruent affect, judgement and insight appear normal   Data Reviewed:  Notable labs -- glucose 135, BUN 52, Cr 1.50, Ca 8.8, Hbg 10.3 >> 9.7 >> 8.4 ?Dilutional, platelets 122k  Family Communication: None present  Disposition: Status is: Observation Remains admitted on safety observations per psychiatry. Needs to resolve constipation, suspected underlying cause of her N/V   Planned Discharge Destination: Home    Time spent: 42 minutes  Author: Burnard DELENA Cunning, DO 11/12/2023 3:41 PM  For on call review [email or web address].

## 2023-11-12 NOTE — Progress Notes (Signed)
   11/11/23 2000  Spiritual Encounters  Type of Visit Initial  Care provided to: Patient  Reason for visit Routine spiritual support  OnCall Visit Yes  Spiritual Framework  Presenting Themes Significant life change  Interventions  Spiritual Care Interventions Made Established relationship of care and support;Compassionate presence;Encouragement   Chaplain provided compassionate presence. The patient shared their love for dogs and how material things are not so important any more. Chaplain listened intently and heard the suffering of the patient physically and mentally. Chaplain encouraged patient and tried to make patient feel seen.

## 2023-11-12 NOTE — Plan of Care (Signed)
  Problem: Education: Goal: Knowledge of General Education information will improve Description Including pain rating scale, medication(s)/side effects and non-pharmacologic comfort measures Outcome: Progressing   Problem: Clinical Measurements: Goal: Respiratory complications will improve Outcome: Not Progressing   Problem: Activity: Goal: Risk for activity intolerance will decrease Outcome: Not Progressing   

## 2023-11-12 NOTE — Consult Note (Signed)
 [facility] Psychiatric Consult Initial  Patient Name: .[name]  MRN: [id number]  DOB: 1955/07/26  Consult Order details:  Orders (From admission, onward)     Start     Ordered   11/12/23 0842  IP CONSULT TO PSYCHIATRY       Ordering Provider: Fausto Burnard LABOR, DO  Provider:  (Not yet assigned)  Question Answer Comment  [facility]   Reason for Consult? Admitted to medicine from geropsych      11/12/23 0841             Mode of Visit: In person    Psychiatry Consult Evaluation  Service Date: November 12, 2023 LOS:  LOS: 1 day  Chief Complaint depression  Primary Psychiatric Diagnoses  MDD moderate   Assessment  [name] is a 68 y.o. female admitted: MedicallySheila [name] is a 68 year old female with pulmonary hypertension, hypertension, atrial fibrillation on Eliquis , history of DVT/PE with IVC, heart failure preserved ejection fraction, morbid obesity, CKD stage IV, chronic respiratory failure requiring 2 L nasal cannula at baseline, anxiety, insulin -dependent diabetes mellitus, who presents emergency department on 11/02/2023 for chief concerns of intentional overdose of OxyContin  on 11/01/2023.Patient was admitted to behavioral health service on 11/06/2023 for the same. Hospitalist service was consulted on 11/11/2023 for chief concerns of intractable nausea and vomiting.Patient was admitted to medical floor.Psychiatry is consulted for follow up.  On assessment patient consistently denies SI/HI/plan.  She remains future oriented and reports that she feels safe being on the medical floor getting the medical care she needs.  Patient is willing to work with the [facility] and transitioning to long-term placement.  She is taking her medications with no reported side effects.  No indication of IVC at this time.  Discontinued the suicide precautions and one-to-one sitter.  Will follow-up intermittently to ensure patient is responding well to the  psychotropic medication.     Diagnoses:  Active Hospital problems: Principal Problem:   Intractable nausea and vomiting Active Problems:   Morbid obesity with BMI of 50.0-59.9, adult (HCC)   Essential hypertension   Pulmonary HTN, Moderate  PA pressure 09/2009   LBBB (left bundle branch block)   Thrombocytopenia (HCC)   Paroxysmal atrial fibrillation (HCC)   Type 2 diabetes mellitus with stage 3b chronic kidney disease, with long-term current use of insulin  (HCC)   Hypothyroidism   (HFpEF) heart failure with preserved ejection fraction (HCC)   Drug overdose, intentional, initial encounter (HCC)   Polyuria   CKD (chronic kidney disease) stage 4, GFR 15-29 ml/min (HCC)    Plan   ## Psychiatric Medication Recommendations:  Continue Celexa  and Abilify   ## Medical Decision Making Capacity: Not specifically addressed in this encounter  ## Further Work-up:   Ongoing medical workup through vascular surgeon, CBC CMP   ## Disposition:-- There are no psychiatric contraindications to discharge at this time  ## Behavioral / Environmental: -Utilize compassion and acknowledge the patient's experiences while setting clear and realistic expectations for care.    ## Safety and Observation Level:  - Based on my clinical evaluation, I estimate the patient to be at low risk of self harm in the current setting. - At this time, we recommend  routine. This decision is based on my review of the chart including patient's history and current presentation, interview of the patient, mental status examination, and consideration of suicide risk including evaluating suicidal ideation, plan, intent, suicidal or self-harm behaviors, risk factors, and protective factors. This  judgment is based on our ability to directly address suicide risk, implement suicide prevention strategies, and develop a safety plan while the patient is in the clinical setting. Please contact our team if there is a concern that  risk level has changed.  CSSR Risk Category:C-SSRS RISK CATEGORY: High Risk  Suicide Risk Assessment: Patient has following modifiable risk factors for suicide: Ongoing medical problems which we are addressing by optimizing medical treatment and involving palliative care. Patient has following non-modifiable or demographic risk factors for suicide: psychiatric hospitalization Patient has the following protective factors against suicide: Supportive family  Thank you for this consult request. Recommendations have been communicated to the primary team.  We will follow-up at this time.   Leah Durango, MD       History of Present Illness  Relevant Aspects of [facility]   Today on interview patient is noted to be resting in bed.  She is awake, alert, oriented x 4.  She offers no complaints.  She denies SI/HI/plan.  She reports feeling safe in bed or being on the medical floor and reports that she has spoken to the hospitalist and other teams including vascular team regarding the wounds on her legs.  She reports being future oriented and wants to get better.  She is willing to work with [facility] and transitioning to long-term care.  She denies having any side effects with the psychotropic medications including Celexa  and Abilify .  Collateral information:  Received a call from patient's brother and he was updated about the medical transfer to the medical floor of the patient.    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 Hx: Denies Living Situation: Lives alone in private residence Spiritual Hx: Christian Access to weapons/lethal means: Denies    Substance History Alcohol: Denies use History of alcohol withdrawal seizures: N/a History of DT's: N/a Tobacco: Former; quit in 1990 Illicit drugs: No Prescription drug abuse: No Rehab hx: No  Exam Findings  Physical Exam: Reviewed and agree with the treatment plan Vital Signs:  Temp:  [97.9 F (36.6 C)-98.5 F (36.9 C)] 98.2 F (36.8 C) (08/07 1159) Pulse Rate:  [49-52] 50 (08/07 1159) Resp:  [18-20] 18 (08/07 1159) BP: (138-161)/(48-60) 143/54 (08/07 1159) SpO2:  [97 %-100 %] 99 % (08/07 1159) Weight:  [141.2 kg] 141.2 kg (08/06 1827) Blood pressure (!) 143/54, pulse (!) 50, temperature 98.2 F (36.8 C), temperature source Oral, resp. rate 18, height 5' 1 (1.549 m), weight (!) 141.2 kg, SpO2 99%. Body mass index is 58.82 kg/m.    Mental Status Exam: General Appearance: Casual and Disheveled  Orientation:  Full (Time, Place, and Person)  Memory:  Immediate;   Fair Recent;   Fair Remote;   Fair  Concentration:  Concentration: Fair and Attention Span: Fair  Recall:  Fair  Attention  Fair  Eye Contact:  Fair  Speech:  Clear and Coherent  Language:  Fair  Volume:  Normal  Mood: fine  Affect:  Appropriate  Thought Process:  Coherent  Thought Content:  Logical  Suicidal Thoughts:  No  Homicidal Thoughts:  No  Judgement:  Impaired  Insight:  Present  Psychomotor Activity:  Decreased  Akathisia:  No  Fund of Knowledge:  Fair      Assets:  Communication Skills  Cognition:  WNL  ADL's:  Impaired  AIMS (if indicated):        Other History   These have been pulled in through the EMR, reviewed, and updated if appropriate.  Family History:  The patient's family history includes Cancer in her maternal uncle; Cerebral aneurysm in her maternal aunt, maternal grandfather, and mother;  Hypertension in her father.  Medical History: Past Medical History:  Diagnosis Date   A-fib (HCC)    Anemia    Anticoagulated on Coumadin , chronically 09/03/2011   Anxiety    Arthritis    right hip; both knees; left wrist/shoulder; back (01/19/2013   Bleeding on Coumadin  08/2012; 01/18/2013   [facility] admissions (01/19/2013)   CHF (congestive heart failure) (HCC)    2-3 times (01/19/2013)   Chronic lower back pain    Depression    DVT (deep venous thrombosis) (HCC) 10 years ago   numerous/notes 01/18/2013   GERD (gastroesophageal reflux disease)    Gout    Headache(784.0)    maybe weekly (01/19/2013)   Heart murmur    High cholesterol    been off RX for this at one time (01/18/2013)   History of blood transfusion 1983; 04/2012   3 w/ childbirth; hospitalized for pain (01/19/2013)   Hypertension    Hypothyroidism    Migraines    twice/yr maybe (01/19/2013)   Obstructive sleep apnea 05/03/2012   OSA (obstructive sleep apnea)    sent me for test in 04/2012; never ordered mask, etc (01/19/2013)   PE (pulmonary thromboembolism) (HCC) 3 years ag0   3/notes 01/18/2013   Pneumonia before 2011   once' (01/18/2013)   Renal disorder    kindey function low; Metformin was destroying my kidneys (01/19/2013)   Shortness of breath    only related to my CHF (01/18/2013)   Swelling of hand 08/31/2014   RT HAND   Type II diabetes mellitus (HCC)    UTI (urinary tract infection) 08/31/2014    Surgical History: Past Surgical History:  Procedure Laterality Date   CARDIAC CATHETERIZATION N/A 03/13/2015   Procedure: Right/Left Heart Cath and Coronary Angiography;  Surgeon: Gordy Bergamo, MD;  Location: [facility] INVASIVE CV LAB;  Service: Cardiovascular;  Laterality: N/A;   CATARACT EXTRACTION W/ INTRAOCULAR LENS  IMPLANT, BILATERAL Bilateral 2006-2011   CESAREAN SECTION  1983   CHOLECYSTECTOMY  ~ 2002   COLONOSCOPY N/A 01/21/2013   Procedure: COLONOSCOPY;  Surgeon: Belvie JONETTA Just, MD;   Location: [facility] ENDOSCOPY;  Service: Endoscopy;  Laterality: N/A;   EYE SURGERY Bilateral    multiple (01/18/2013)   PACEMAKER IMPLANT N/A 08/31/2018   Symptomatic bradycardia due to mobitz II second degree AV block, permanent afib/ atypical atrial flutter implanted by Dr Kelsie PRESCOTT PLANA REPAIR OF RETINAL DEATACHMENT Right    PARS PLANA VITRECTOMY Bilateral 2004-2006   several (01/18/2013)   REFRACTIVE SURGERY Bilateral    for stigmatism (01/18/2013)   REFRACTIVE SURGERY Left ~ 11/2012   to puff it up cause vision got hazy (01/18/2013)   RIGHT HEART CATH N/A 12/30/2018   Procedure: RIGHT HEART CATH;  Surgeon: Cherrie Toribio SAUNDERS, MD;  Location: [facility] INVASIVE CV LAB;  Service: Cardiovascular;  Laterality: N/A;   RIGHT/LEFT HEART CATH AND CORONARY ANGIOGRAPHY N/A 09/07/2023  Procedure: RIGHT/LEFT HEART CATH AND CORONARY ANGIOGRAPHY;  Surgeon: Wendel Lurena POUR, MD;  Location: [facility] INVASIVE CV LAB;  Service: Cardiovascular;  Laterality: N/A;   VENA CAVA FILTER PLACEMENT  2011?     Medications:   Current Facility-Administered Medications:    acetaminophen  (TYLENOL ) tablet 650 mg, 650 mg, Oral, Q6H PRN **OR** acetaminophen  (TYLENOL ) suppository 650 mg, 650 mg, Rectal, Q6H PRN, Cox, Amy N, DO   allopurinol  (ZYLOPRIM ) tablet 50 mg, 50 mg, Oral, QODAY, Cox, Amy N, DO, 50 mg at 11/12/23 0959   alum & mag hydroxide-simeth (MAALOX/MYLANTA) 200-200-20 MG/5ML suspension 30 mL, 30 mL, Oral, Q4H PRN, Timeka Goette, MD   amLODipine  (NORVASC ) tablet 10 mg, 10 mg, Oral, Daily, Cox, Amy N, DO, 10 mg at 11/12/23 1000   apixaban  (ELIQUIS ) tablet 5 mg, 5 mg, Oral, BID, Cox, Amy N, DO, 5 mg at 11/12/23 9040   ARIPiprazole  (ABILIFY ) tablet 2 mg, 2 mg, Oral, QHS, Riyanshi Wahab, MD   citalopram  (CELEXA ) tablet 40 mg, 40 mg, Oral, QHS, Lindel Marcell, MD   diazepam  (VALIUM ) tablet 5 mg, 5 mg, Oral, BID PRN, Cox, Amy N, DO   diphenhydrAMINE  (BENADRYL ) capsule 25 mg, 25 mg, Oral, QHS PRN, Cox, Amy N, DO    ferrous sulfate  tablet 325 mg, 325 mg, Oral, Daily, Alura Olveda, MD, 325 mg at 11/12/23 9040   guaiFENesin -dextromethorphan  (ROBITUSSIN DM) 100-10 MG/5ML syrup 5 mL, 5 mL, Oral, Q6H PRN, Cox, Amy N, DO   hydrALAZINE  (APRESOLINE ) injection 5 mg, 5 mg, Intravenous, Q6H PRN, Cox, Amy N, DO   insulin  aspart (novoLOG ) injection 0-15 Units, 0-15 Units, Subcutaneous, TID WC, Cox, Amy N, DO, 5 Units at 11/12/23 1217   insulin  aspart (novoLOG ) injection 0-5 Units, 0-5 Units, Subcutaneous, QHS, Cox, Amy N, DO   insulin  glargine-yfgn (SEMGLEE ) injection 20 Units, 20 Units, Subcutaneous, Daily, Cox, Amy N, DO, 20 Units at 11/11/23 2228   levothyroxine  (SYNTHROID ) tablet 75 mcg, 75 mcg, Oral, QAC breakfast, Naliya Gish, MD, 75 mcg at 11/12/23 9383   melatonin tablet 10 mg, 10 mg, Oral, QHS, Cox, Amy N, DO, 10 mg at 11/11/23 2228   multivitamin with minerals tablet 1 tablet, 1 tablet, Oral, Daily, Amerika Nourse, MD, 1 tablet at 11/12/23 9040   neomycin -bacitracin -polymyxin [phone] OINT 1 Application, 1 Application, Apply externally, BID PRN, Odessa Nishi, MD   nystatin  (MYCOSTATIN /NYSTOP ) topical powder, , Topical, TID PRN, Kip Kautzman, MD   ondansetron  (ZOFRAN ) tablet 4 mg, 4 mg, Oral, Q6H PRN, 4 mg at 11/11/23 1842 **OR** ondansetron  (ZOFRAN ) injection 4 mg, 4 mg, Intravenous, Q6H PRN, Cox, Amy N, DO   Oral care mouth rinse, 15 mL, Mouth Rinse, PRN, Cox, Amy N, DO   oxyCODONE  (Oxy IR/ROXICODONE ) immediate release tablet 5 mg, 5 mg, Oral, Q6H PRN, Shaquil Aldana, MD   polyethylene glycol (MIRALAX  / GLYCOLAX ) packet 17 g, 17 g, Oral, Daily, Fausto Sor A, DO, 17 g at 11/12/23 1217   prochlorperazine  (COMPAZINE ) injection 10 mg, 10 mg, Intravenous, Q6H PRN, Cox, Amy N, DO   rosuvastatin  (CRESTOR ) tablet 10 mg, 10 mg, Oral, Daily, Savannaha Stonerock, MD, 10 mg at 11/12/23 9040   senna-docusate (Senokot-S) tablet 1 tablet, 1 tablet, Oral, BID, Fausto Sor A, DO   torsemide  (DEMADEX )  tablet 40 mg, 40 mg, Oral, BID, Cox, Amy N, DO, 40 mg at 11/12/23 0959  Allergies: Allergies  Allergen Reactions   Ms Contin [Morphine] Hives, Rash and Other (See Comments)    Broke out in brown spots all over.  Shawnmichael Parenteau, MD

## 2023-11-12 NOTE — Consult Note (Addendum)
 Consultation Note Date: 11/12/2023 at 1145  Patient Name: [name]  DOB: [birth year]  MRN: [id number]  Age / Sex: 68 y.o., female  PCP: Valma Carwin, MD Referring Physician: Fausto Burnard LABOR, DO  HPI/Patient Profile: 68 y.o. female  with past medical history of pulmonary hypertension, hypertension, atrial fibrillation on Eliquis , history of DVT/PE with IVC, heart failure preserved ejection fraction, morbid obesity, CKD stage IV, chronic respiratory failure requiring 2 L nasal cannula at baseline, anxiety, insulin -dependent diabetes mellitus scented to the emergency department on 7/28 for chief concerns of intentional overdose of OxyContin  on 7/27.  Patient was admitted to behavioral health services on 8/1.  However, patient was admitted inpatient MedSurg on 8/6 for chief concerns of intractable nausea and vomiting.  PMT was initially consulted for reason as follows: Patient has palliative care intervention and got discharged home and admitted for suicide attempt. her code status is listed wrong on admission.Please assist.   Clinical Assessment and Goals of Care: Extensive chart review completed prior to meeting patient including labs, vital signs, imaging, progress notes, orders, and available advanced directive documents from current and previous encounters. I then met with patient at bedside  to discuss diagnosis prognosis, GOC, EOL wishes, disposition and options.  I introduced Palliative Medicine as specialized medical care for people living with serious illness. It focuses on providing relief from the symptoms and stress of a serious illness. The goal is to improve quality of life for both the patient and the family.  We discussed a brief life review of the patient.  Patient is familiar with palliative medicine team as she met with my colleagues during her previous hospitalization.  I highlighted that  patient had completed an HCPOA, DNR, and MOST form with my colleague.  Patient recalls creating this documentation.  I discussed wishes and goals as outlined in this paperwork.  Patient shares that she does not ever want to accept any artificial nutrition/hydration or any life-prolonging measures.  She shares she wants to move somewhere closer to her sister and brother.  [place] where [place] see the fall leaves change.  She also shares she is hopeful she can get an Mining engineer wheelchair.  She denies SI at this time.  Discussed that patient had initially had hospice services.  Discussed hospice philosophy, aging in place, and allowing the natural disease trajectory to occur.  Patient shares that she wants to have the best quality of life that she can.  She is unsure if she wishes to continue with hospice services but that she does want to be allowed to age in place somewhere nice.  I clarified that DNR with limited interventions remains patient's wishes.  Additionally, she would never be accepting of artificial means to sustain her life.  Symptoms assessed.  Patient complains of constipation as main contributing factor to her nausea and vomiting.  She endorses she has had small bowel movements over the last 2 weeks but has not had one regular/formed/large bowel movement in  the last 2 weeks.  Physical assessment reveals tenderness of right and left lower quadrant.  No rebound tenderness noted.  Pain is nonradiating.  Discussed use of senna and MiraLAX  to combat constipation with enema being the next option.  She is in agreement with stool softener and MiraLAX  at this time.  Counseled with attending that TSH was 25.014 on 7/28.  As per chart review, I cannot see where intervention or repeat labs were performed.  Advised TSH level be drawn and appropriately treated as hypothyroidism can contribute to N/V.    No adjustment to plan of care at this time.  Primary  Decision Maker PATIENT  Physical Exam Vitals reviewed.  Constitutional:      General: She is not in acute distress.    Appearance: She is obese.  HENT:     Head: Normocephalic.     Mouth/Throat:     Mouth: Mucous membranes are moist.  Eyes:     Pupils: Pupils are equal, round, and reactive to light.  Cardiovascular:     Rate and Rhythm: Normal rate.  Pulmonary:     Effort: Pulmonary effort is normal.  Abdominal:     Tenderness: There is abdominal tenderness.  Neurological:     Mental Status: She is alert and oriented to person, place, and time.  Psychiatric:        Mood and Affect: Mood normal.        Behavior: Behavior normal.        Thought Content: Thought content normal.        Judgment: Judgment normal.     Palliative Assessment/Data: 50-60%     Thank you for this consult. Palliative medicine will continue to follow and assist holistically.   Time Total: 75 minutes  Time spent includes: Detailed review of medical records (labs, imaging, vital signs), medically appropriate exam (mental status, respiratory, cardiac, skin), discussed with treatment team, counseling and educating patient, family and staff, documenting clinical information, medication management and coordination of care.  Signed by: Lamarr Gunner, DNP, FNP-BC Palliative Medicine   Please contact Palliative Medicine Team providers via [facility] for questions and concerns.

## 2023-11-12 NOTE — Plan of Care (Signed)

## 2023-11-12 NOTE — Consult Note (Addendum)
 WOC Nurse Consult Note: Reason for Consult: Requested to apply a unna boot bilateral legs. Wound type: Venous stasis ulcers Pressure Injury POA: NA Measurement: see nursing flowsheet. Wound bed: 100% red. Drainage (amount, consistency, odor)  Periwound: crusts, intact, hemosiderin dermatitis.  Note: The pt is already in the [facility] nurse following list. Schedule to reapply weekly. Last seen 08/06.  WOC team will follow weekly. Please reconsult if further assistance is needed. Thank-you,  Lela Holm BSN, CNS, RN, [employer], WOCN  (Phone [phone])

## 2023-11-12 NOTE — TOC Progression Note (Signed)
 Transition of Care [facility]) - Progression Note    Patient Details  Name: EFRATA BRUNNER MRN: [id number] Date of Birth: [birth year]  Transition of Care [facility] CM/SW Contact  Dalia GORMAN Fuse, RN Phone Number: 11/12/2023, 3:41 PM  Clinical Narrative:    The patient is from [place] [facility] bed at [facility], she admitted inpatient following a rapid response. TOC visited the patient in her room to issue Code 44. The patient advised that she has cleaned out her home and it has been rented out. She was at [facility] for STR in June, but had started the transition to LTC when she was admitted to [facility].From [facility] she transitioned to a Geri psych bed here at [facility].  The patient verbalized she is interested in LTC placement. TOC outreached to [employer] with [facility] to see if they can offer a bed and to see where they were in the placement process. TOC is awaiting f/u from Tammy                     Expected Discharge Plan and Services                                               Social Drivers of Health (SDOH) Interventions SDOH Screenings   Food Insecurity: No Food Insecurity (11/11/2023)  Housing: Low Risk  (11/11/2023)  Recent Concern: Housing - High Risk (11/05/2023)  Transportation Needs: No Transportation Needs (11/11/2023)  Utilities: Not At Risk (11/11/2023)  Alcohol Screen: Low Risk  (11/05/2023)  Social Connections: Moderately Isolated (11/11/2023)  Tobacco Use: Medium Risk (11/11/2023)    Readmission Risk Interventions     No data to display

## 2023-11-12 NOTE — Progress Notes (Signed)
 Dr Fausto and primary RN Joesph made aware that per CCMD, pacer stopped pacing and converted to an idioventricular rhythm, HR was 55, lasted for only a few seconds, then converted back to v-paced rhythm, HR now 50

## 2023-11-13 ENCOUNTER — Ambulatory Visit (HOSPITAL_BASED_OUTPATIENT_CLINIC_OR_DEPARTMENT_OTHER): Admitting: Internal Medicine

## 2023-11-13 DIAGNOSIS — K5900 Constipation, unspecified: Secondary | ICD-10-CM | POA: Diagnosis present

## 2023-11-13 DIAGNOSIS — Z794 Long term (current) use of insulin: Secondary | ICD-10-CM | POA: Diagnosis not present

## 2023-11-13 DIAGNOSIS — B9562 Methicillin resistant Staphylococcus aureus infection as the cause of diseases classified elsewhere: Secondary | ICD-10-CM | POA: Diagnosis not present

## 2023-11-13 DIAGNOSIS — N170 Acute kidney failure with tubular necrosis: Secondary | ICD-10-CM | POA: Diagnosis not present

## 2023-11-13 DIAGNOSIS — R188 Other ascites: Secondary | ICD-10-CM | POA: Diagnosis present

## 2023-11-13 DIAGNOSIS — I83029 Varicose veins of left lower extremity with ulcer of unspecified site: Secondary | ICD-10-CM | POA: Diagnosis present

## 2023-11-13 DIAGNOSIS — Z6841 Body Mass Index (BMI) 40.0 and over, adult: Secondary | ICD-10-CM | POA: Diagnosis not present

## 2023-11-13 DIAGNOSIS — F321 Major depressive disorder, single episode, moderate: Secondary | ICD-10-CM | POA: Diagnosis present

## 2023-11-13 DIAGNOSIS — E1122 Type 2 diabetes mellitus with diabetic chronic kidney disease: Secondary | ICD-10-CM | POA: Diagnosis present

## 2023-11-13 DIAGNOSIS — E1152 Type 2 diabetes mellitus with diabetic peripheral angiopathy with gangrene: Secondary | ICD-10-CM | POA: Diagnosis present

## 2023-11-13 DIAGNOSIS — I5033 Acute on chronic diastolic (congestive) heart failure: Secondary | ICD-10-CM | POA: Diagnosis not present

## 2023-11-13 DIAGNOSIS — R112 Nausea with vomiting, unspecified: Secondary | ICD-10-CM | POA: Diagnosis not present

## 2023-11-13 DIAGNOSIS — Z1629 Resistance to other single specified antibiotic: Secondary | ICD-10-CM | POA: Diagnosis present

## 2023-11-13 DIAGNOSIS — I130 Hypertensive heart and chronic kidney disease with heart failure and stage 1 through stage 4 chronic kidney disease, or unspecified chronic kidney disease: Secondary | ICD-10-CM | POA: Diagnosis present

## 2023-11-13 DIAGNOSIS — I4821 Permanent atrial fibrillation: Secondary | ICD-10-CM | POA: Diagnosis present

## 2023-11-13 DIAGNOSIS — I2723 Pulmonary hypertension due to lung diseases and hypoxia: Secondary | ICD-10-CM | POA: Diagnosis present

## 2023-11-13 DIAGNOSIS — Z95828 Presence of other vascular implants and grafts: Secondary | ICD-10-CM | POA: Diagnosis not present

## 2023-11-13 DIAGNOSIS — N184 Chronic kidney disease, stage 4 (severe): Secondary | ICD-10-CM | POA: Diagnosis present

## 2023-11-13 DIAGNOSIS — D631 Anemia in chronic kidney disease: Secondary | ICD-10-CM | POA: Diagnosis present

## 2023-11-13 DIAGNOSIS — L02411 Cutaneous abscess of right axilla: Secondary | ICD-10-CM | POA: Diagnosis not present

## 2023-11-13 DIAGNOSIS — E039 Hypothyroidism, unspecified: Secondary | ICD-10-CM

## 2023-11-13 DIAGNOSIS — Z66 Do not resuscitate: Secondary | ICD-10-CM | POA: Diagnosis present

## 2023-11-13 DIAGNOSIS — E662 Morbid (severe) obesity with alveolar hypoventilation: Secondary | ICD-10-CM | POA: Diagnosis present

## 2023-11-13 DIAGNOSIS — J9611 Chronic respiratory failure with hypoxia: Secondary | ICD-10-CM | POA: Diagnosis present

## 2023-11-13 DIAGNOSIS — R1110 Vomiting, unspecified: Secondary | ICD-10-CM | POA: Diagnosis present

## 2023-11-13 LAB — GLUCOSE, CAPILLARY
Glucose-Capillary: 114 mg/dL — ABNORMAL HIGH (ref 70–99)
Glucose-Capillary: 167 mg/dL — ABNORMAL HIGH (ref 70–99)
Glucose-Capillary: 153 mg/dL — ABNORMAL HIGH (ref 70–99)
Glucose-Capillary: 167 mg/dL — ABNORMAL HIGH (ref 70–99)

## 2023-11-13 LAB — CBC
HCT: 28.1 % — ABNORMAL LOW (ref 36.0–46.0)
Hemoglobin: 8.4 g/dL — ABNORMAL LOW (ref 12.0–15.0)
MCH: 32.3 pg (ref 26.0–34.0)
MCHC: 29.9 g/dL — ABNORMAL LOW (ref 30.0–36.0)
MCV: 108.1 fL — ABNORMAL HIGH (ref 80.0–100.0)
Platelets: 121 K/uL — ABNORMAL LOW (ref 150–400)
RBC: 2.6 MIL/uL — ABNORMAL LOW (ref 3.87–5.11)
RDW: 15.9 % — ABNORMAL HIGH (ref 11.5–15.5)
WBC: 7.9 K/uL (ref 4.0–10.5)
nRBC: 0 % (ref 0.0–0.2)

## 2023-11-13 MED ORDER — ALBUMIN HUMAN 25 % IV SOLN
12.5000 g | Freq: Two times a day (BID) | INTRAVENOUS | Status: DC
  Administered 2023-11-13 – 2023-11-14 (×4): 12.5 g via INTRAVENOUS
  Filled 2023-11-13 (×5): qty 50

## 2023-11-13 MED ORDER — FUROSEMIDE 10 MG/ML IJ SOLN
40.0000 mg | Freq: Two times a day (BID) | INTRAMUSCULAR | Status: DC
  Administered 2023-11-13 – 2023-11-19 (×19): 40 mg via INTRAVENOUS
  Filled 2023-11-13 (×13): qty 4

## 2023-11-13 NOTE — Evaluation (Signed)
 Occupational Therapy Evaluation Patient Details Name: [name] MRN: [id number] DOB: Jun 02, 1955 Today's Date: 11/13/2023   History of Present Illness   [name] is a 68 y.o. female admitted: Presented to the EDfor 11/02/2023 12:21 PM for intentional ingestion of oxycodone  pills in an attempt to harm self. Patient was admitted to medical floor on 11/11/2023 for chief concerns of intractable nausea and vomiting. She carries the psychiatric diagnoses of MDD and anxiety and has a past medical history of  hypertension, type 2 diabetes, atrial fibrillation, DVT/PE status post IVC filter, HFpEF, single-chamber PPM and chronic pain     Clinical Impressions [name] was seen for OT evaluation this date. Prior to hospital admission, pt was recently at [facility] using RW limited distances, admitted from home alone.  Pt currently requires CGA + RW for toilet t/f - pt abandons RW and furniture walks in bathroom despite cues. SBA pericare and hand washing in standing, MOD A for LB access in sitting. SpO2 98% on 2L [place] at rest, elects to remove with activity and increased WoB noted, SpO2 89% on RA. resolved with seated rest - educated on O2 with activity. Pt would benefit from skilled OT to address safe RW technique for functional mobility and ECS. Upon hospital discharge, recommend OT follow up.      If plan is discharge home, recommend the following:   A little help with bathing/dressing/bathroom;Help with stairs or ramp for entrance     Functional Status Assessment   Patient has had a recent decline in their functional status and demonstrates the ability to make significant improvements in function in a reasonable and predictable amount of time.     Equipment Recommendations   Other (comment) (RW)     Recommendations for Other Services         Precautions/Restrictions   Precautions Precautions: Fall Recall of Precautions/Restrictions: Intact Restrictions Weight Bearing  Restrictions Per Provider Order: No     Mobility Bed Mobility Overal bed mobility: Modified Independent             General bed mobility comments: rails and increased time    Transfers Overall transfer level: Needs assistance Equipment used: Rolling walker (2 wheels) Transfers: Sit to/from Stand Sit to Stand: Supervision                  Balance Overall balance assessment: Needs assistance Sitting-balance support: Single extremity supported Sitting balance-[name]: Good     Standing balance support: Single extremity supported, During functional activity Standing balance-[name]: Fair                             ADL either performed or assessed with clinical judgement   ADL Overall ADL's : Needs assistance/impaired                                       General ADL Comments: CGA + RW for toilet t/f - pt abandons RW and furniture walks in bathroom despite cues. SBA pericare and hand washing in standing, MOD A for LB access in sitting.      Vision         Perception         Praxis         Pertinent Vitals/Pain Pain Assessment Pain Assessment: No/denies pain     Extremity/Trunk Assessment Upper Extremity Assessment  Upper Extremity Assessment: Generalized weakness   Lower Extremity Assessment Lower Extremity Assessment: Generalized weakness       Communication Communication Communication: No apparent difficulties   Cognition Arousal: Alert Behavior During Therapy: WFL for tasks assessed/performed, Flat affect Cognition: No apparent impairments                               Following commands: Intact       Cueing  General Comments   Cueing Techniques: Verbal cues  SpO2 98% on 2L [place] at rest, elects to remove with activity and increased WoB noted, SpO2 89% on RA. resolved with seated rest - educated on O2 with activity   Exercises     Shoulder Instructions      Home Living  Family/patient expects to be discharged to:: Private residence Living Arrangements: Alone   Type of Home: House Home Access: Stairs to enter Secretary/administrator of Steps: 3 Entrance Stairs-Rails: Left Home Layout: One level     Bathroom Shower/Tub: Walk-in shower         Home Equipment: Shower seat;Hand held shower head;Grab bars - tub/shower;Rollator (4 wheels);Rolling Walker (2 wheels)   Additional Comments: Recent STR admission, on return home pt unable to care for self and attempted LTC transition. Pt living at home alone with use of RW for mobility and recently placed on 2Ls of O2. Pt endorses no support/assist at home.      Prior Functioning/Environment Prior Level of Function : Independent/Modified Independent             Mobility Comments: use RW for ambulation outside and rollator in house, rarely goes out; since recent discharge, needed +2 assist to manage stairs but reports 3 months ago had been able to manage stairs mod I ADLs Comments: had been mod I for ADLs, groceries delivered, uses [employer] - reports recently everything has become more difficult    OT Problem List: Decreased strength;Impaired balance (sitting and/or standing);Decreased safety awareness;Cardiopulmonary status limiting activity;Decreased activity tolerance   OT Treatment/Interventions: Self-care/ADL training;Therapeutic exercise;Patient/family education;Balance training;Energy conservation;Therapeutic activities      OT Goals(Current goals can be found in the care plan section)   Acute Rehab OT Goals Patient Stated Goal: to go to LTC OT Goal Formulation: With patient Time For Goal Achievement: 11/27/23 Potential to Achieve Goals: Fair ADL Goals Pt Will Perform Grooming: Independently;standing Pt Will Perform Lower Body Dressing: with modified independence;sit to/from stand Pt Will Transfer to Toilet: with modified independence;ambulating;regular height toilet Additional ADL Goal #1: Pt  will complete PIll Box assessment with a passing score   OT Frequency:  Min 1X/week    Co-evaluation              AM-PAC OT 6 Clicks Daily Activity     Outcome Measure Help from another person eating meals?: None Help from another person taking care of personal grooming?: None Help from another person toileting, which includes using toliet, bedpan, or urinal?: A Little Help from another person bathing (including washing, rinsing, drying)?: A Lot Help from another person to put on and taking off regular upper body clothing?: A Little Help from another person to put on and taking off regular lower body clothing?: A Lot 6 Click Score: 18   End of Session Equipment Utilized During Treatment: Rolling walker (2 wheels);Oxygen  Activity Tolerance: Patient tolerated treatment well Patient left: in bed;with call bell/phone within reach;with bed alarm set  OT Visit Diagnosis: Unsteadiness  on feet (R26.81);Muscle weakness (generalized) (M62.81)                Time: 9097-9081 OT Time Calculation (min): 16 min Charges:  OT General Charges $OT Visit: 1 Visit OT Evaluation $OT Eval Low Complexity: 1 Low OT Treatments $Self Care/Home Management : 8-22 mins  Elston Slot, M.S. OTR/L  11/13/23, 9:28 AM  ascom [phone]

## 2023-11-13 NOTE — Care Management Obs Status (Signed)
 MEDICARE OBSERVATION STATUS NOTIFICATION   Patient Details  Name: [name] MRN: [id number] Date of Birth: 1955/10/11   Medicare Observation Status Notification Given:  Yes    Clariece Roesler W, CMA 11/13/2023, 11:04 AM

## 2023-11-13 NOTE — Progress Notes (Signed)
 Palliative Care Progress Note, Assessment & Plan   Patient Name: [name]       Date: 11/13/2023 DOB: February 09, 1956  Age: 68 y.o. MRN#: [id number] Attending Physician: Fausto Burnard LABOR, DO Primary Care Physician: Valma Carwin, MD Admit Date: 11/11/2023  Subjective: Patient is lying in bed, sleeping, but easily wakens to my residents.  She acknowledges my presence and is able to make her wishes known.  She is alert and drained x 4.  No family or friends present during my visit.  HPI: 68 y.o. female  with past medical history of pulmonary hypertension, hypertension, atrial fibrillation on Eliquis , history of DVT/PE with IVC, heart failure preserved ejection fraction, morbid obesity, CKD stage IV, chronic respiratory failure requiring 2 L nasal cannula at baseline, anxiety, insulin -dependent diabetes mellitus scented to the emergency department on 7/28 for chief concerns of intentional overdose of OxyContin  on 7/27.   Patient was admitted to behavioral health services on 8/1.  However, patient was admitted inpatient MedSurg on 8/6 for chief concerns of intractable nausea and vomiting.   PMT was initially consulted for reason as follows: Patient has palliative care intervention and got discharged home and admitted for suicide attempt. her code status is listed wrong on admission.Please assist.   Summary of counseling/coordination of care: Extensive chart review completed prior to meeting patient including labs, vital signs, imaging, progress notes, orders, and available advanced directive documents from current and previous encounters.   After reviewing the patient's chart and assessing the patient at bedside, I spoke with patient in regards to symptom management and goals of care.   Patient endorses she had  2 small bowel movements yesterday as well as a larger more formed bowel movement this morning.  She endorses the pain in her stomach and constipation is dissipating.  Patient endorses current regimen is appropriately managing her symptoms.  No adjustment to [facility] needed at this time.  Discussed elevated TSH (7/28-25.04, 8/8-14.995) and close management of hypothyroidism.  She was appreciative of this medical update.  She has no acute complaints at this time.  Symptom burden is low.  Goals are clear. DNR with limited interventions remains.  TOC following closely for discharge planning.  PMT will step back from daily visits and monitor the patient peripherally.  Please re-engage with PMT if goals change, at patient/family's request, or if patient's health deteriorates during hospitalization.    Physical Exam Vitals reviewed.  Constitutional:      General: She is not in acute distress.    Appearance: She is obese.  HENT:     Head: Normocephalic.     Mouth/Throat:     Mouth: Mucous membranes are moist.  Eyes:     Pupils: Pupils are equal, round, and reactive to light.  Pulmonary:     Effort: Pulmonary effort is normal.  Abdominal:     Palpations: Abdomen is soft.  Neurological:     Mental Status: She is alert.  Psychiatric:        Mood and Affect: Mood normal.        Behavior: Behavior normal.        Thought Content: Thought content normal.        Judgment: Judgment  normal.             Total Time 25 minutes   Time spent includes: Detailed review of medical records (labs, imaging, vital signs), medically appropriate exam (mental status, respiratory, cardiac, skin), discussed with treatment team, counseling and educating patient, family and staff, documenting clinical information, medication management and coordination of care.  Lamarr L. Arvid, DNP, FNP-BC Palliative Medicine Team

## 2023-11-13 NOTE — Consult Note (Addendum)
 WOC Nurse Consult Note: Reason for Consult: LLE Unna's boot with oozing strike through Wound type: venous stasis; however she has a chronic area that seems to bleed excessively from time to time and the wounds on the LLE are hypergranulated  Pressure Injury POA: NA Measurement: see note from 11/11/23 Wound bed: see nursing flow sheets, pictures taken 11/11/23 and today; clot on the left lateral wound; did not disrupt, new areas of dark ruddy tissue present today.  Drainage (amount, consistency, odor) bloody Periwound: venous stasis dermatitis  Dressing procedure/placement/frequency: Calcium  alginate for hemostatic properties, To be used under Unna's boot on the LLE for chronic wound that bleeds.  Change weekly with Unna's boots changes.  Will plan to re-evaluate Tuesday 8/11  WOC will re-apply Unna's boots, this is not a skill set the bedside nurses routinely have.   Shoshanah Dapper [facility], CNS, CWON-AP [phone]

## 2023-11-13 NOTE — Progress Notes (Addendum)
 Progress Note   Patient: [name] FMW:[id number] DOB: 1955/05/29 DOA: 11/11/2023     1 DOS: the patient was seen and examined on 11/13/2023   Brief hospital course:   Annaston Upham is a 68 year old female with pulmonary hypertension, hypertension, atrial fibrillation on Eliquis , history of DVT/PE with IVC, heart failure preserved ejection fraction, morbid obesity, CKD stage IV, chronic respiratory failure requiring 2 L nasal cannula at baseline, anxiety, insulin -dependent diabetes mellitus, who presents emergency department on 11/02/2023 for chief concerns of intentional overdose of OxyContin  on 11/01/2023.   Patient was admitted to behavioral health service on 11/06/2023 for the same.   Hospitalist service was consulted on 11/11/2023 for chief concerns of intractable nausea and vomiting.... since the night before See H&P for full HPI on admission & ED course.   Patient was admitted for further evaluation and management.    8/7 -- pt tolerating PO intake without N/V today.  She reports it's been 2 weeks since a bowel movement and feels intermittent N/V due to constipation.  Bowel regimen initiated.  8/8 -- weeping edema from abdomen. Left Unna boot with visible oozing blood soaking through.  Started IV Lasix  + albumin .   Assessment and Plan:  * Intractable nausea and vomiting - resolved Suspect due to severe constipation, no BM in 2 weeks. --Bowel regimen - escalate as needed --IV antiemetics as needed --Tolerating PO intake, no need for IV fluids --Monitor BMP  Polyuria UA with only trace leukocytes, rare bacteria, 0-5 WBC's -- not consistent with UTI. Urine culture from 05/05/2023: Showed Enterobacter cloacae, pansensitive Prior to admission, pt given Keflex  500 mg p.o. twice daily, note stated that patient received 3 days however patient only completed 5 doses total (2.5 days) of treatment 11/05/2023: First dose 2158; 2 doses were received on 11/06/2023; 2 doses were received on  11/07/2023 --Monitor urine output --Monitor fever curve and for dysuria  Acute on Chronic HFpEF - compensated clinically on admission, now with weeping from abdomen and legs.   Recent echo noted EF 60-65%, moderate LVH, normal RV -Recent R/LHC noted no significant CAD, PA pressure 62/16, wedge of 11, PVR 4.4, group 3 pulmonary hypertension suspected 8/8 - Will escalate diuresis. --Strict I's and O's, daily weights --Hold home torsemide  40 mg PO BID  --Start IV Lasix  40 mg BID, with IV albumin  before --Monitor renal function & electrolytes closely  Chronic Lymphedema -- currently with increased, weeping edema Venous stasis ulcers of left lower extremity --Diuresis as above --Unna boots on BLE's per WOC -- re-consulted due to left leg weeping blood thru Unna just replaced 8/6.  Nursing to capture photo today for assessment. --Monitor closely for cellulitis with low threshold to start antibiotics  Type 2 diabetes mellitus with stage 3b chronic kidney disease, with long-term current use of insulin  (HCC) --Home long-acting insulin  equivalent 20 units daily resumed --Insulin  SSI with at bedtime coverage ordered -Goal inpatient blood glucose levels 140-180  Paroxysmal atrial fibrillation (HCC) --Continue Eliquis  --HR ~50, not on rate control agent  Essential hypertension --Continue home amlodipine  10 mg  --Hydralazine  5 mg IV PRN  Hypothyroidism --Continue levothyroxine    Morbid obesity with BMI of 50.0-59.9, adult (HCC) Complicates overall care and prognosis.  Recommend lifestyle modifications including physical activity and diet for weight loss and overall long-term health.  CKD (chronic kidney disease) stage 4, GFR 15-29 ml/min (HCC) Baseline creatinine around 1.6-2  Better than baseline currently with Cr 1.5 --Monitor BMP with diuresis  Drug overdose, intentional, initial encounter (HCC) Pt  was admitted to medical floor from geropsych unit. One-on-one suicide precautions were  lifted per psychiatry on 11/12/23. Pt can be discharged when medically stable, no longer requires psych admission.  OHS/OSA Pulmonary HTN type III --Doesn't tolerate CPAP --Supplement O2 as needed, target sats > 90%  Hx of DVT/PE Hx of IVC filter --Continue Eliquis       Subjective: Pt seen awake sitting up in bed with RN and LPN at bedside this AM.  Pt became nauseous again after taking AM meds.  She reports weeping fluid from her abdomen, soaking gown and sheets.  Left leg wrap has visible blood weeping from it.  She denies fever/chills.   Physical Exam: Vitals:   11/12/23 1554 11/12/23 2010 11/13/23 0434 11/13/23 0812  BP: (!) 138/49 (!) 141/45 (!) 146/55 (!) 154/60  Pulse: (!) 50 (!) 51 (!) 50 (!) 50  Resp: 18 16 18 19   Temp: 98.6 F (37 C) 98.4 F (36.9 C) 98.1 F (36.7 C) 98.1 F (36.7 C)  TempSrc: Oral     SpO2: 98% 100% 100% 99%  Weight:      Height:       General exam: awake, alert, no acute distress, obese, holding emesis bag HEENT: moist mucus membranes, hearing grossly normal  Respiratory system: CTAB, no wheezes, rales or rhonchi, normal respiratory effort. Cardiovascular system: normal S1/S2, RRR  Gastrointestinal system: soft, NT, ND Central nervous system: A&O x 3. no gross focal neurologic deficits, normal speech Extremities: lympedema with induration proximally to thigh and lower abdomen weeping, Unna boots on BLE's with left side weeping blood through (image below) Skin: dry, intact, normal temperature Psychiatry: normal mood, congruent affect, judgement and insight appear normal     Data Reviewed:  Notable labs -- Hbg stable 8.4, platelets 122 >> 121k CBG's at goal   Family Communication: None present  Disposition: Status is: Observation  Remains admitted with ongoing nausea, now also volume overload with weeping edema requiring IV diuresis     Planned Discharge Destination: Home    Time spent: 45 minutes  Author: Burnard DELENA Cunning,  DO 11/13/2023 11:15 AM  For on call review [email or web address].

## 2023-11-13 NOTE — Evaluation (Signed)
 Physical Therapy Evaluation Patient Details Name: [name] MRN: [id number] DOB: [birth year] Today's Date: 11/13/2023  History of Present Illness  [name] is a 67yoF 11/02/2023 after intentional ingestion of oxycodone  c SI. PMH: MDD, GAD,HTN, AF, DM2, DVT/PE status post IVC filter, HFpEF, single-chamber PPM and chronic pain. Pt recently started on home O2.  Clinical Impression  Pt asleep, strong startle response to authors voice. Pt disappointed that authro missed all the mobility she performed earlier in day, but is agreeable to AMB to toilet which she was planning to do soon with NSG after lasix . Pt has a lot of physical limitations in bed mobility but is well poised in her adapted techniques and cue author to set up bed as she does at her most recent baseline. Pt does well with STS transfers and short distance AMB, but is limited today as she typically is by her chronic dyspnea on exertion. Pt left on commode with NSG. Pt voices concerns that return to home alone is not a safe option, acknowledges that she really needs a lot of help right now for safe IADL/ADL completion. Will continue to follow.       If plan is discharge home, recommend the following:     Can travel by private vehicle        Equipment Recommendations None recommended by PT  Recommendations for Other Services       Functional Status Assessment Patient has had a recent decline in their functional status and demonstrates the ability to make significant improvements in function in a reasonable and predictable amount of time.     Precautions / Restrictions Precautions Precautions: Fall Restrictions Weight Bearing Restrictions Per Provider Order: No      Mobility  Bed Mobility Overal bed mobility: Needs Assistance Bed Mobility: Supine to Sit     Supine to sit: Supervision, HOB elevated, Used rails     General bed mobility comments: cues author to set up bed as she mobilizes at home: needs HOB >35  degrees, uses LUE on bed rail, RUE on authors hand, needs help removing bed sheets from her grippy socks    Transfers Overall transfer level: Needs assistance Equipment used: Rolling walker (2 wheels) Transfers: Sit to/from Stand Sit to Stand: Supervision                Ambulation/Gait Ambulation/Gait assistance: Supervision Gait Distance (Feet): 12 Feet Assistive device: Rolling walker (2 wheels) Gait Pattern/deviations: Step-through pattern       General Gait Details: AMB into BR for toileting, author manages RW; pt estimated she could've walked about 4x this far, but likely no farther in her current state.  Stairs            Wheelchair Mobility     Tilt Bed    Modified Rankin (Stroke Patients Only)       Balance                                             Pertinent Vitals/Pain Pain Assessment Pain Assessment: No/denies pain    Home Living Family/patient expects to be discharged to:: Private residence Living Arrangements: Alone Available Help at Discharge: Neighbor;Available PRN/intermittently Type of Home: House Home Access: Stairs to enter Entrance Stairs-Rails: Doctor, general practice of Steps: 4   Home Layout: One level Home Equipment: Shower seat;Hand held shower head;Grab bars - tub/shower;Rollator (4  wheels);Rolling Walker (2 wheels) Additional Comments: Recent STR admission with  failed transition back to home alone.    Prior Function               Mobility Comments: pt reports tolerating household AMB only at baseline, too limited with DOE. ADLs Comments: had been mod I for ADLs, groceries delivered, uses [employer] - reports recently everything has become more difficult     Extremity/Trunk Assessment                Communication        Cognition                                         Cueing       General Comments      Exercises     Assessment/Plan    PT  Assessment Patient needs continued PT services  PT Problem List Decreased strength;Decreased activity tolerance;Decreased balance;Decreased mobility;Cardiopulmonary status limiting activity       PT Treatment Interventions DME instruction;Gait training;Stair training;Functional mobility training;Therapeutic activities;Therapeutic exercise;Balance training;Patient/family education    PT Goals (Current goals can be found in the Care Plan section)  Acute Rehab PT Goals Patient Stated Goal: transition to LTC or progress function for safer independence PT Goal Formulation: With patient Time For Goal Achievement: 11/27/23 Potential to Achieve Goals: Fair    Frequency Min 2X/week     Co-evaluation               AM-PAC PT 6 Clicks Mobility  Outcome Measure Help needed turning from your back to your side while in a flat bed without using bedrails?: A Lot Help needed moving from lying on your back to sitting on the side of a flat bed without using bedrails?: A Lot Help needed moving to and from a bed to a chair (including a wheelchair)?: A Little Help needed standing up from a chair using your arms (e.g., wheelchair or bedside chair)?: A Little Help needed to walk in hospital room?: A Little Help needed climbing 3-5 steps with a railing? : A Lot 6 Click Score: 15    End of Session Equipment Utilized During Treatment: Oxygen Activity Tolerance: Patient tolerated treatment well;No increased pain;Patient limited by fatigue Patient left: in chair;with call bell/phone within reach;with nursing/sitter in room;with family/visitor present Nurse Communication: Mobility status PT Visit Diagnosis: Difficulty in walking, not elsewhere classified (R26.2);Other abnormalities of gait and mobility (R26.89);Muscle weakness (generalized) (M62.81)    Time: 1545-1600 PT Time Calculation (min) (ACUTE ONLY): 15 min   Charges:   PT Evaluation $PT Eval Moderate Complexity: 1 Mod   PT General  Charges $$ ACUTE PT VISIT: 1 Visit       4:17 PM, 11/13/23 [name] Linear, PT, DPT Physical Therapist - [facility]  [phone] (ASCOM)    Cherissa Hook C 11/13/2023, 4:14 PM

## 2023-11-13 NOTE — Progress Notes (Signed)
 PT Cancellation Note  Patient Details Name: [name] MRN: [id number] DOB: 12/01/1955   Cancelled Treatment:    Reason Eval/Treat Not Completed: Fatigue/lethargy limiting ability to participate (Pt reports she is too sleepy and nauseated to work with PT at this time. Author states intention of return in PM as to evaluate at that time.)   11:39 AM, 11/13/23 [name] Linear, PT, DPT Physical Therapist - [facility]  [phone] (ASCOM)    [name] C 11/13/2023, 11:38 AM

## 2023-11-13 NOTE — TOC Progression Note (Signed)
 Transition of Care [facility]) - Progression Note    Patient Details  Name: [name] MRN: [id number] Date of Birth: 10/27/1955  [facility]) CM/SW Contact  Dalia GORMAN Fuse, RN Phone Number: 11/13/2023, 2:36 PM  Clinical Narrative:     TOC spoke with the patient and she is interested in a LTC facility. TOC spoke with Madelin Jacobus at [facility] and an application for [employer] was submitted in the past. The patient requested the application be withdrawn when she found out she would have to give up all but $70 from her SSI check. TOC spoke with the patient and she advised she is aware that she may have to potentially surrender her check and would like to proceed with the Medicaid screening for LTSS>   The patient is open to SNF in [address], [place], and [place] TOC will complete FL2 and send out.                    Expected Discharge Plan and Services                                               Social Drivers of Health (SDOH) Interventions SDOH Screenings   Food Insecurity: No Food Insecurity (11/11/2023)  Housing: Low Risk  (11/11/2023)  Recent Concern: Housing - High Risk (11/05/2023)  Transportation Needs: No Transportation Needs (11/11/2023)  Utilities: Not At Risk (11/11/2023)  Alcohol Screen: Low Risk  (11/05/2023)  Social Connections: Moderately Isolated (11/11/2023)  Tobacco Use: Medium Risk (11/11/2023)    Readmission Risk Interventions     No data to display

## 2023-11-14 DIAGNOSIS — R112 Nausea with vomiting, unspecified: Secondary | ICD-10-CM | POA: Diagnosis not present

## 2023-11-14 LAB — GLUCOSE, CAPILLARY
Glucose-Capillary: 142 mg/dL — ABNORMAL HIGH (ref 70–99)
Glucose-Capillary: 145 mg/dL — ABNORMAL HIGH (ref 70–99)
Glucose-Capillary: 174 mg/dL — ABNORMAL HIGH (ref 70–99)
Glucose-Capillary: 153 mg/dL — ABNORMAL HIGH (ref 70–99)

## 2023-11-14 LAB — BASIC METABOLIC PANEL WITH GFR
Anion gap: 7 (ref 5–15)
BUN: 57 mg/dL — ABNORMAL HIGH (ref 8–23)
CO2: 33 mmol/L — ABNORMAL HIGH (ref 22–32)
Calcium: 9 mg/dL (ref 8.9–10.3)
Chloride: 100 mmol/L (ref 98–111)
Creatinine, Ser: 1.9 mg/dL — ABNORMAL HIGH (ref 0.44–1.00)
GFR, Estimated: 29 mL/min — ABNORMAL LOW (ref >60)
Glucose, Bld: 114 mg/dL — ABNORMAL HIGH (ref 70–99)
Potassium: 4.6 mmol/L (ref 3.5–5.1)
Sodium: 140 mmol/L (ref 135–145)

## 2023-11-14 LAB — CBC
HCT: 27.2 % — ABNORMAL LOW (ref 36.0–46.0)
Hemoglobin: 8 g/dL — ABNORMAL LOW (ref 12.0–15.0)
MCH: 31.5 pg (ref 26.0–34.0)
MCHC: 29.4 g/dL — ABNORMAL LOW (ref 30.0–36.0)
MCV: 107.1 fL — ABNORMAL HIGH (ref 80.0–100.0)
Platelets: 117 K/uL — ABNORMAL LOW (ref 150–400)
RBC: 2.54 MIL/uL — ABNORMAL LOW (ref 3.87–5.11)
RDW: 16 % — ABNORMAL HIGH (ref 11.5–15.5)
WBC: 7 K/uL (ref 4.0–10.5)
nRBC: 0 % (ref 0.0–0.2)

## 2023-11-14 MED ORDER — APIXABAN 2.5 MG PO TABS
2.5000 mg | ORAL_TABLET | Freq: Two times a day (BID) | ORAL | Status: DC
  Administered 2023-11-14 – 2023-11-15 (×2): 2.5 mg via ORAL
  Filled 2023-11-14 (×2): qty 1

## 2023-11-14 MED ORDER — ENSURE PLUS HIGH PROTEIN PO LIQD
237.0000 mL | Freq: Two times a day (BID) | ORAL | Status: DC
  Administered 2023-11-15 – 2023-11-17 (×3): 237 mL via ORAL

## 2023-11-14 NOTE — Progress Notes (Signed)
 No further bleeding from left lower leg since this morning. Una boots intact to BLE.

## 2023-11-14 NOTE — Progress Notes (Signed)
 Progress Note   Patient: [name] FMW:[id number] DOB: 30-Aug-1955 DOA: 11/11/2023     2 DOS: the patient was seen and examined on 11/14/2023   Brief hospital course:   Gloria Ricardo is a 68 year old female with pulmonary hypertension, hypertension, atrial fibrillation on Eliquis , history of DVT/PE with IVC, heart failure preserved ejection fraction, morbid obesity, CKD stage IV, chronic respiratory failure requiring 2 L nasal cannula at baseline, anxiety, insulin -dependent diabetes mellitus, who presents emergency department on 11/02/2023 for chief concerns of intentional overdose of OxyContin  on 11/01/2023.   Patient was admitted to behavioral health service on 11/06/2023 for the same.   Hospitalist service was consulted on 11/11/2023 for chief concerns of intractable nausea and vomiting.... since the night before See H&P for full HPI on admission & ED course.   Patient was admitted for further evaluation and management.    8/7 -- pt tolerating PO intake without N/V today.  She reports it's been 2 weeks since a bowel movement and feels intermittent N/V due to constipation.  Bowel regimen initiated.  8/8 -- weeping edema from abdomen. Left Unna boot with visible oozing blood soaking through.  Started IV Lasix  + albumin .   Assessment and Plan:  * Intractable nausea and vomiting - resolved Suspect due to severe constipation, no BM in 2 weeks. --Bowel regimen - escalate as needed --IV antiemetics as needed --Tolerating PO intake, no need for IV fluids --Monitor BMP  Polyuria - sounds resolved.  Now on IV Lasix , polyuria is expected. UA with only trace leukocytes, rare bacteria, 0-5 WBC's -- not consistent with UTI. Urine culture from 05/05/2023: Showed Enterobacter cloacae, pansensitive Prior to admission, pt given Keflex  500 mg p.o. twice daily, note stated that patient received 3 days however patient only completed 5 doses total (2.5 days) of treatment 11/05/2023: First dose 2158; 2  doses were received on 11/06/2023; 2 doses were received on 11/07/2023 --Monitor urine output --Monitor fever curve and for dysuria  Acute on Chronic HFpEF - compensated clinically on admission, now with weeping from abdomen and legs.   Recent echo noted EF 60-65%, moderate LVH, normal RV -Recent R/LHC noted no significant CAD, PA pressure 62/16, wedge of 11, PVR 4.4, group 3 pulmonary hypertension suspected 8/8 - Will escalate diuresis. 8/9 - continuing IV Lasix  + albumin  today.  Cr rising but still in her baseline range. --Strict I's and O's, daily weights --Hold home torsemide  40 mg PO BID  --Continue IV Lasix  40 mg BID, with IV albumin  before --Monitor renal function & electrolytes closely  Chronic Lymphedema -- currently with increased, weeping edema Venous stasis ulcers of left lower extremity  Images taken are available in Media tab & below in exam There has been weeping blood coming through [employer], one ulcer is prone to bleeding.  Hbg overall stable, not significant blood loss. --Will temporarily reduce Eliquis  to 2.5 mg BID to reduce bleeding and since platelets declining --Diuresis as above --Unna boots on BLE's per WOC -- re-consulted due to left leg weeping blood thru Unna just replaced 8/6.   --Monitor closely for cellulitis with low threshold to start antibiotics  Type 2 diabetes mellitus with stage 3b chronic kidney disease, with long-term current use of insulin  (HCC) --Home long-acting insulin  equivalent 20 units daily resumed --Insulin  SSI with at bedtime coverage ordered -Goal inpatient blood glucose levels 140-180  Paroxysmal atrial fibrillation (HCC) --Continue Eliquis  --HR ~50, not on rate control agent  Essential hypertension --Continue home amlodipine  10 mg  --Hydralazine  5 mg  IV PRN  Hypothyroidism --Continue levothyroxine    Morbid obesity with BMI of 50.0-59.9, adult (HCC) Complicates overall care and prognosis.  Recommend lifestyle modifications  including physical activity and diet for weight loss and overall long-term health.  CKD (chronic kidney disease) stage 4, GFR 15-29 ml/min (HCC) Baseline creatinine around 1.6-2  Better than baseline currently with Cr 1.5 --Monitor BMP with diuresis  Drug overdose, intentional, initial encounter (HCC) Pt was admitted to medical floor from geropsych unit. One-on-one suicide precautions were lifted per psychiatry on 11/12/23. Pt can be discharged when medically stable, no longer requires psych admission.  OHS/OSA Pulmonary HTN type III --Doesn't tolerate CPAP --Supplement O2 as needed, target sats > 90%  Hx of DVT/PE Hx of IVC filter --Continue Eliquis       Subjective: Pt seated edge of bed with NT present, had just returned from the bathroom.  Pt denies increased urination with IV Lasix  yesterday, but has been 3 times so far this AM.  Due to the bleeding from her leg wound, she is agreeable to lower dose eliquis  temporarily.  She denies other complaints or issues.  No acute events reported.   Physical Exam: Vitals:   11/14/23 0338 11/14/23 0435 11/14/23 0745 11/14/23 0959  BP: (!) 148/54  (!) 138/52 (!) 138/52  Pulse: (!) 52  (!) 50   Resp: 18  20   Temp: 98.4 F (36.9 C)  98 F (36.7 C)   TempSrc: Oral  Oral   SpO2: 100%  100%   Weight:  (!) 138.2 kg    Height:       General exam: awake, alert, no acute distress, obese, holding emesis bag HEENT: moist mucus membranes, hearing grossly normal  Respiratory system: CTAB with good aeration, no wheezes or rhonchi, normal respiratory effort at rest. Cardiovascular system: normal S1/S2, RRR  Gastrointestinal system: soft, NT, ND Central nervous system: A&O x 3. no gross focal neurologic deficits, normal speech Extremities: lympedema with induration proximally to thigh and lower abdomen weeping, Unna boots on BLE's with left side weeping blood through (image below) Skin: dry, intact, normal temperature Psychiatry: normal mood,  congruent affect, judgement and insight appear normal  Image taken 11/13/23:    Data Reviewed:  Notable labs -- Hbg 8.4>>8.0, platelets 122 >> 121 >> 117k Cr 1.90 still in baseline range BUN 57 Bicarb 33  CBG's at goal   Family Communication: None present  Disposition: Status is: Inpatient  Remains inpatient appropriate due to --  volume overload with weeping edema requiring IV diuresis     Planned Discharge Destination: Home    Time spent: 42 minutes  Author: Burnard DELENA Cunning, DO 11/14/2023 12:39 PM  For on call review [email or web address].

## 2023-11-14 NOTE — Plan of Care (Signed)
  Problem: Clinical Measurements: Goal: Will remain free from infection Outcome: Progressing Goal: Respiratory complications will improve Outcome: Progressing Goal: Cardiovascular complication will be avoided Outcome: Progressing   Problem: Activity: Goal: Risk for activity intolerance will decrease Outcome: Progressing   Problem: Nutrition: Goal: Adequate nutrition will be maintained Outcome: Progressing   Problem: Coping: Goal: Level of anxiety will decrease Outcome: Progressing   Problem: Fluid Volume: Goal: Ability to maintain a balanced intake and output will improve Outcome: Progressing   Problem: Nutritional: Goal: Maintenance of adequate nutrition will improve Outcome: Progressing   Problem: Tissue Perfusion: Goal: Adequacy of tissue perfusion will improve Outcome: Progressing

## 2023-11-15 DIAGNOSIS — R112 Nausea with vomiting, unspecified: Secondary | ICD-10-CM | POA: Diagnosis not present

## 2023-11-15 DIAGNOSIS — I5033 Acute on chronic diastolic (congestive) heart failure: Secondary | ICD-10-CM | POA: Diagnosis not present

## 2023-11-15 LAB — RENAL FUNCTION PANEL
Albumin: 3.3 g/dL — ABNORMAL LOW (ref 3.5–5.0)
Anion gap: 9 (ref 5–15)
BUN: 57 mg/dL — ABNORMAL HIGH (ref 8–23)
CO2: 31 mmol/L (ref 22–32)
Calcium: 8.9 mg/dL (ref 8.9–10.3)
Chloride: 99 mmol/L (ref 98–111)
Creatinine, Ser: 1.69 mg/dL — ABNORMAL HIGH (ref 0.44–1.00)
GFR, Estimated: 33 mL/min — ABNORMAL LOW (ref >60)
Glucose, Bld: 115 mg/dL — ABNORMAL HIGH (ref 70–99)
Phosphorus: 3.6 mg/dL (ref 2.5–4.6)
Potassium: 4.5 mmol/L (ref 3.5–5.1)
Sodium: 139 mmol/L (ref 135–145)

## 2023-11-15 LAB — CBC
HCT: 27 % — ABNORMAL LOW (ref 36.0–46.0)
Hemoglobin: 8.2 g/dL — ABNORMAL LOW (ref 12.0–15.0)
MCH: 32.3 pg (ref 26.0–34.0)
MCHC: 30.4 g/dL (ref 30.0–36.0)
MCV: 106.3 fL — ABNORMAL HIGH (ref 80.0–100.0)
Platelets: 127 K/uL — ABNORMAL LOW (ref 150–400)
RBC: 2.54 MIL/uL — ABNORMAL LOW (ref 3.87–5.11)
RDW: 16 % — ABNORMAL HIGH (ref 11.5–15.5)
WBC: 7.2 K/uL (ref 4.0–10.5)
nRBC: 0 % (ref 0.0–0.2)

## 2023-11-15 LAB — GLUCOSE, CAPILLARY
Glucose-Capillary: 124 mg/dL — ABNORMAL HIGH (ref 70–99)
Glucose-Capillary: 163 mg/dL — ABNORMAL HIGH (ref 70–99)
Glucose-Capillary: 155 mg/dL — ABNORMAL HIGH (ref 70–99)
Glucose-Capillary: 170 mg/dL — ABNORMAL HIGH (ref 70–99)

## 2023-11-15 MED ORDER — APIXABAN 5 MG PO TABS
5.0000 mg | ORAL_TABLET | Freq: Two times a day (BID) | ORAL | Status: DC
Start: 2023-11-15 — End: 2023-11-30
  Administered 2023-11-15 – 2023-11-30 (×36): 5 mg via ORAL
  Filled 2023-11-15 (×31): qty 1

## 2023-11-15 NOTE — Progress Notes (Signed)
 Progress Note    [name]  FMW:[id number] DOB: Sep 10, 1955  DOA: 11/11/2023 PCP: Valma Carwin, MD      Brief Narrative:    Medical records reviewed and are as summarized below:  [name] is a 68 y.o. female with pulmonary hypertension, hypertension, atrial fibrillation on Eliquis , history of DVT/PE with IVC, heart failure preserved ejection fraction, morbid obesity, CKD stage IV, chronic respiratory failure requiring 2 L nasal cannula at baseline, anxiety, insulin -dependent diabetes mellitus, who presented to the emergency department on 11/02/2023 because of intentional overdose of OxyContin  on 11/01/2023.  She was admitted to the behavioral health unit on 11/06/2023 for further management. Hospitalist team was consulted on 11/21/2023 because of concerns of intractable nausea and vomiting.  She also complained of constipation for about 2 weeks.  Patient was also found to have weeping edema from the abdomen and weeping edema from left leg with bloody drainage soaking through left Unna boot.     Assessment/Plan:   Principal Problem:   Intractable nausea and vomiting Active Problems:   Polyuria   Essential hypertension   Paroxysmal atrial fibrillation (HCC)   Type 2 diabetes mellitus with stage 3b chronic kidney disease, with long-term current use of insulin  (HCC)   (HFpEF) heart failure with preserved ejection fraction (HCC)   Morbid obesity with BMI of 50.0-59.9, adult (HCC)   LBBB (left bundle branch block)   Hypothyroidism   Pulmonary HTN, Moderate  PA pressure 09/2009   Thrombocytopenia (HCC)   Drug overdose, intentional, initial encounter (HCC)   CKD (chronic kidney disease) stage 4, GFR 15-29 ml/min (HCC)    Body mass index is 59.98 kg/m.  (Morbid obesity)     Intractable nausea and vomiting - resolved Suspect due to severe constipation, no BM in 2 weeks. --Bowel regimen - escalate as needed --IV antiemetics as needed --Tolerating PO intake, no  need for IV fluids --Monitor BMP   Polyuria - sounds resolved.  Now on IV Lasix , polyuria is expected. UA with only trace leukocytes, rare bacteria, 0-5 WBC's -- not consistent with UTI. Urine culture from 05/05/2023: Showed Enterobacter cloacae, pansensitive Prior to admission, pt given Keflex  500 mg p.o. twice daily, note stated that patient received 3 days however patient only completed 5 doses total (2.5 days) of treatment 11/05/2023: First dose 2158; 2 doses were received on 11/06/2023; 2 doses were received on 11/07/2023 --Monitor urine output --Monitor fever curve and for dysuria    Acute on Chronic HFpEF, anasarca (ascites, peripheral edema)-  Continue IV Lasix .  Discontinue IV albumin . Check BMP, daily weight and urine output. Torsemide  on hold. Recent echo noted EF 60-65%, moderate LVH, normal RV -Recent R/LHC noted no significant CAD, PA pressure 62/16, wedge of 11, PVR 4.4, group 3 pulmonary hypertension suspected     Chronic Lymphedema -- currently with increased, weeping edema Venous stasis ulcers of left lower extremity  Continue Unna boots on bilateral lower extremities. She had weeping edema (bloody drainage) from left leg.   Type 2 diabetes mellitus with long-term current use of insulin  (HCC) Continue Semglee  and NovoLog  as needed   Paroxysmal atrial fibrillation, bradycardia Continue Eliquis  HR in the 50s not on rate control drugs     CKD (chronic kidney disease) stage 4, GFR 15-29 ml/min (HCC) Baseline creatinine around 1.6-2  Better than baseline currently with Cr 1.5 --Monitor BMP with diuresis    Drug overdose, intentional, initial encounter (HCC) Major depressive disorder, anxiety Continue Celexa  and Abilify  Pt was  initially admitted to medical floor from geropsych unit. One-on-one suicide precautions were lifted per psychiatry on 11/12/23. Pt can be discharged when medically stable, no longer requires psych admission.    OHS/OSA Pulmonary HTN type  III --Doesn't tolerate CPAP --Supplement O2 as needed, target sats > 90%       History of DVT and PE s/p IVC filter on Eliquis     Comorbidities include hypertension, hypothyroidism,   Diet Order             Diet Carb Modified Fluid consistency: Thin; Room service appropriate? Yes  Diet effective now                                  Consultants: Psychiatrist Palliative care Vascular surgeon  Procedures: None    Medications:    allopurinol   50 mg Oral QODAY   amLODipine   10 mg Oral Daily   apixaban   5 mg Oral BID   ARIPiprazole   2 mg Oral QHS   citalopram   40 mg Oral QHS   feeding supplement  237 mL Oral BID BM   ferrous sulfate   325 mg Oral Daily   furosemide   40 mg Intravenous BID   insulin  aspart  0-15 Units Subcutaneous TID WC   insulin  aspart  0-5 Units Subcutaneous QHS   insulin  glargine-yfgn  20 Units Subcutaneous Daily   levothyroxine   75 mcg Oral QAC breakfast   melatonin  10 mg Oral QHS   multivitamin with minerals  1 tablet Oral Daily   polyethylene glycol  17 g Oral Daily   rosuvastatin   10 mg Oral Daily   senna-docusate  1 tablet Oral BID   Continuous Infusions:   Anti-infectives (From admission, onward)    None              Family Communication/Anticipated D/C date and plan/Code Status   DVT prophylaxis: Place TED hose Start: 11/11/23 1406 apixaban  (ELIQUIS ) tablet 5 mg     Code Status: Limited: Do not attempt resuscitation (DNR) -DNR-LIMITED -Do Not Intubate/DNI   Family Communication: None Disposition Plan: Plan to discharge to SNF   Status is: Inpatient Remains inpatient appropriate because: Fluid overload       Subjective:   Interval events noted.  She has no complaints.  She feels okay.  Objective:    Vitals:   11/15/23 0402 11/15/23 0500 11/15/23 0735 11/15/23 1500  BP: (!) 140/51  (!) 149/47 (!) 150/52  Pulse:   (!) 50 (!) 50  Resp: 18  20 18   Temp: 97.9 F (36.6 C)  97.8 F (36.6  C) 98.2 F (36.8 C)  TempSrc:   Oral Oral  SpO2: 99%  96% 98%  Weight:  (!) 144 kg    Height:       No data found.   Intake/Output Summary (Last 24 hours) at 11/15/2023 1621 Last data filed at 11/15/2023 1430 Gross per 24 hour  Intake 900 ml  Output 1500 ml  Net -600 ml   Filed Weights   11/11/23 1827 11/14/23 0435 11/15/23 0500  Weight: (!) 141.2 kg (!) 138.2 kg (!) 144 kg    Exam:  GEN: NAD SKIN: Warm and dry EYES: No pallor or icterus ENT: MMM CV: RRR PULM: CTA B ABD: soft, obese/distended with large pannus, NT, +BS CNS: AAO x 3, non focal EXT: Bilateral leg edema, no tenderness        Data Reviewed:  I have personally reviewed following labs and imaging studies:  Labs: Labs show the following:   Basic Metabolic Panel: Recent Labs  Lab 11/11/23 1414 11/11/23 1503 11/12/23 0405 11/14/23 0516 11/15/23 0517  NA 141 142 140 140 139  K 4.7 4.7 4.6 4.6 4.5  CL 99 100 103 100 99  CO2 30 33* 30 33* 31  GLUCOSE 183* 183* 135* 114* 115*  BUN 52* 50* 52* 57* 57*  CREATININE 1.46* 1.48* 1.50* 1.90* 1.69*  CALCIUM  9.5 9.3 8.8* 9.0 8.9  PHOS  --   --   --   --  3.6   GFR Estimated Creatinine Clearance: 44 mL/min (A) (by C-G formula based on SCr of 1.69 mg/dL (H)). Liver Function Tests: Recent Labs  Lab 11/11/23 1414 11/11/23 1503 11/15/23 0517  AST 22 21  --   ALT 14 14  --   ALKPHOS 59 55  --   BILITOT 0.9 1.0  --   PROT 6.4* 6.2*  --   ALBUMIN  3.7 3.4* 3.3*   No results for input(s): LIPASE, AMYLASE in the last 168 hours. No results for input(s): AMMONIA in the last 168 hours. Coagulation profile No results for input(s): INR, PROTIME in the last 168 hours.  CBC: Recent Labs  Lab 11/08/23 1653 11/11/23 1414 11/11/23 1503 11/12/23 0405 11/13/23 0438 11/14/23 0516 11/15/23 0517  WBC 7.9 8.7 7.4 7.3 7.9 7.0 7.2  NEUTROABS 6.8 7.6 6.4  --   --   --   --   HGB 9.0* 10.3* 9.7* 8.4* 8.4* 8.0* 8.2*  HCT 30.0* 33.9* 32.7* 27.9*  28.1* 27.2* 27.0*  MCV 107.9* 105.6* 105.8* 106.5* 108.1* 107.1* 106.3*  PLT 142* 154 139* 122* 121* 117* 127*   Cardiac Enzymes: No results for input(s): CKTOTAL, CKMB, CKMBINDEX, TROPONINI in the last 168 hours. BNP (last 3 results) No results for input(s): PROBNP in the last 8760 hours. CBG: Recent Labs  Lab 11/14/23 1147 11/14/23 1741 11/14/23 2153 11/15/23 0758 11/15/23 1210  GLUCAP 145* 174* 153* 124* 163*   D-Dimer: No results for input(s): DDIMER in the last 72 hours. Hgb A1c: No results for input(s): HGBA1C in the last 72 hours. Lipid Profile: No results for input(s): CHOL, HDL, LDLCALC, TRIG, CHOLHDL, LDLDIRECT in the last 72 hours. Thyroid  function studies: No results for input(s): TSH, T4TOTAL, T3FREE, THYROIDAB in the last 72 hours.  Invalid input(s): FREET3 Anemia work up: No results for input(s): VITAMINB12, FOLATE, FERRITIN, TIBC, IRON , RETICCTPCT in the last 72 hours. Sepsis Labs: Recent Labs  Lab 11/12/23 0405 11/13/23 0438 11/14/23 0516 11/15/23 0517  WBC 7.3 7.9 7.0 7.2    Microbiology No results found for this or any previous visit (from the past 240 hours).  Procedures and diagnostic studies:  No results found.             LOS: 3 days   [name]  [facility]   Pager on [email or web address]. If 7PM-7AM, please contact night-coverage at [email or web address]     11/15/2023, 4:21 PM

## 2023-11-15 NOTE — Plan of Care (Signed)

## 2023-11-15 NOTE — Care Management Important Message (Signed)
 Important Message  Patient Details  Name: [name] MRN: [id number] Date of Birth: 01-06-1956   Important Message Given:  Yes - Medicare IM     Latisha Lasch W, CMA 11/15/2023, 10:47 AM

## 2023-11-15 NOTE — Plan of Care (Signed)
 Patient seems to have more edema today, increased dyspnea with exertion. Patients abdomen weeping. Patient still needs to have a better bowel movement, Type 1 hard stool today with difficulty.   Problem: Education: Goal: Knowledge of General Education information will improve Description: Including pain rating scale, medication(s)/side effects and non-pharmacologic comfort measures Outcome: Progressing   Problem: Education: Goal: Knowledge of General Education information will improve Description: Including pain rating scale, medication(s)/side effects and non-pharmacologic comfort measures Outcome: Progressing   Problem: Clinical Measurements: Goal: Ability to maintain clinical measurements within normal limits will improve Outcome: Not Progressing Goal: Diagnostic test results will improve Outcome: Not Progressing Goal: Respiratory complications will improve Outcome: Not Progressing   Problem: Activity: Goal: Risk for activity intolerance will decrease Outcome: Not Progressing   Problem: Elimination: Goal: Will not experience complications related to bowel motility Outcome: Not Progressing   Problem: Skin Integrity: Goal: Risk for impaired skin integrity will decrease Outcome: Not Progressing   Problem: Fluid Volume: Goal: Ability to maintain a balanced intake and output will improve Outcome: Not Progressing

## 2023-11-16 DIAGNOSIS — R112 Nausea with vomiting, unspecified: Secondary | ICD-10-CM | POA: Diagnosis not present

## 2023-11-16 LAB — BASIC METABOLIC PANEL WITH GFR
Anion gap: 12 (ref 5–15)
BUN: 56 mg/dL — ABNORMAL HIGH (ref 8–23)
CO2: 30 mmol/L (ref 22–32)
Calcium: 9.2 mg/dL (ref 8.9–10.3)
Chloride: 100 mmol/L (ref 98–111)
Creatinine, Ser: 1.75 mg/dL — ABNORMAL HIGH (ref 0.44–1.00)
GFR, Estimated: 32 mL/min — ABNORMAL LOW (ref >60)
Glucose, Bld: 114 mg/dL — ABNORMAL HIGH (ref 70–99)
Potassium: 4.8 mmol/L (ref 3.5–5.1)
Sodium: 142 mmol/L (ref 135–145)

## 2023-11-16 LAB — GLUCOSE, CAPILLARY
Glucose-Capillary: 114 mg/dL — ABNORMAL HIGH (ref 70–99)
Glucose-Capillary: 145 mg/dL — ABNORMAL HIGH (ref 70–99)
Glucose-Capillary: 146 mg/dL — ABNORMAL HIGH (ref 70–99)
Glucose-Capillary: 230 mg/dL — ABNORMAL HIGH (ref 70–99)

## 2023-11-16 LAB — MAGNESIUM: Magnesium: 2.6 mg/dL — ABNORMAL HIGH (ref 1.7–2.4)

## 2023-11-16 NOTE — Plan of Care (Signed)
  Problem: Education: Goal: Knowledge of General Education information will improve Description: Including pain rating scale, medication(s)/side effects and non-pharmacologic comfort measures Outcome: Progressing   Problem: Clinical Measurements: Goal: Ability to maintain clinical measurements within normal limits will improve Outcome: Progressing Goal: Will remain free from infection Outcome: Progressing Goal: Diagnostic test results will improve Outcome: Progressing Goal: Respiratory complications will improve Outcome: Progressing Goal: Cardiovascular complication will be avoided Outcome: Progressing   Problem: Activity: Goal: Risk for activity intolerance will decrease Outcome: Progressing   Problem: Elimination: Goal: Will not experience complications related to bowel motility Outcome: Progressing Goal: Will not experience complications related to urinary retention Outcome: Progressing

## 2023-11-16 NOTE — Progress Notes (Signed)
 Progress Note    [name]  FMW:[id number] DOB: October 12, 1955  DOA: 11/11/2023 PCP: Valma Carwin, MD      Brief Narrative:    Medical records reviewed and are as summarized below:  [name] is a 68 y.o. female with pulmonary hypertension, hypertension, atrial fibrillation on Eliquis , history of DVT/PE with IVC, heart failure preserved ejection fraction, morbid obesity, CKD stage IV, chronic respiratory failure requiring 2 L nasal cannula at baseline, anxiety, insulin -dependent diabetes mellitus, who presented to the emergency department on 11/02/2023 because of intentional overdose of OxyContin  on 11/01/2023.  She was admitted to the behavioral health unit on 11/06/2023 for further management. Hospitalist team was consulted on 11/21/2023 because of concerns of intractable nausea and vomiting.  She also complained of constipation for about 2 weeks.  Patient was also found to have weeping edema from the abdomen and weeping edema from left leg with bloody drainage soaking through left Unna boot.     Assessment/Plan:   Principal Problem:   Intractable nausea and vomiting Active Problems:   Polyuria   Essential hypertension   Paroxysmal atrial fibrillation (HCC)   Type 2 diabetes mellitus with stage 3b chronic kidney disease, with long-term current use of insulin  (HCC)   (HFpEF) heart failure with preserved ejection fraction (HCC)   Morbid obesity with BMI of 50.0-59.9, adult (HCC)   LBBB (left bundle branch block)   Hypothyroidism   Pulmonary HTN, Moderate  PA pressure 09/2009   Thrombocytopenia (HCC)   Drug overdose, intentional, initial encounter (HCC)   CKD (chronic kidney disease) stage 4, GFR 15-29 ml/min (HCC)    Body mass index is 59.07 kg/m.  (Morbid obesity)     Intractable nausea and vomiting - resolved Suspect due to severe constipation, no BM in 2 weeks. --Bowel regimen - escalate as needed --IV antiemetics as needed --Tolerating PO intake, no  need for IV fluids --Monitor BMP   Polyuria -probably from diuresis UA with only trace leukocytes, rare bacteria, 0-5 WBC's -- not consistent with UTI. Urine culture from 05/05/2023: Showed Enterobacter cloacae, pansensitive Prior to admission, pt given Keflex  500 mg p.o. twice daily, note stated that patient received 3 days however patient only completed 5 doses total (2.5 days) of treatment 11/05/2023: First dose 2158; 2 doses were received on 11/06/2023; 2 doses were received on 11/07/2023 --Monitor urine output --Monitor fever curve and for dysuria    Acute on Chronic HFpEF, anasarca (ascites, peripheral edema)-  Continue IV Lasix .  Monitor BMP, daily weight and urine output. Torsemide  on hold. Recent echo noted EF 60-65%, moderate LVH, normal RV -Recent R/LHC noted no significant CAD, PA pressure 62/16, wedge of 11, PVR 4.4, group 3 pulmonary hypertension suspected     Chronic Lymphedema -- currently with increased, weeping edema Venous stasis ulcers of left lower extremity  Continue Unna boots on bilateral lower extremities. She had weeping edema (bloody drainage) from left leg.   Type 2 diabetes mellitus with long-term current use of insulin  (HCC) Continue Semglee  and NovoLog  as needed   Paroxysmal atrial fibrillation, bradycardia Continue Eliquis  HR in the 50s not on rate control drugs     CKD (chronic kidney disease) stage 4, GFR 15-29 ml/min (HCC) Baseline creatinine around 1.6-2  Better than baseline currently with Cr 1.5 --Monitor BMP with diuresis    Drug overdose, intentional, initial encounter (HCC) Major depressive disorder, anxiety Continue Celexa  and Abilify  Pt was initially admitted to medical floor from geropsych unit. One-on-one suicide precautions  were lifted per psychiatry on 11/12/23. Pt can be discharged when medically stable, no longer requires psych admission.    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 Yes  Diet effective now                                  Consultants: Psychiatrist Palliative care Vascular surgeon  Procedures: None    Medications:    allopurinol   50 mg Oral QODAY   amLODipine   10 mg Oral Daily   apixaban   5 mg Oral BID   ARIPiprazole   2 mg Oral QHS   citalopram   40 mg Oral QHS   feeding supplement  237 mL Oral BID BM   ferrous sulfate   325 mg Oral Daily   furosemide   40 mg Intravenous BID   insulin  aspart  0-15 Units Subcutaneous TID WC   insulin  aspart  0-5 Units Subcutaneous QHS   insulin  glargine-yfgn  20 Units Subcutaneous Daily   levothyroxine   75 mcg Oral QAC breakfast   melatonin  10 mg Oral QHS   multivitamin with minerals  1 tablet Oral Daily   polyethylene glycol  17 g Oral Daily   rosuvastatin   10 mg Oral Daily   senna-docusate  1 tablet Oral BID   Continuous Infusions:   Anti-infectives (From admission, onward)    None              Family Communication/Anticipated D/C date and plan/Code Status   DVT prophylaxis: Place TED hose Start: 11/11/23 1406 apixaban  (ELIQUIS ) tablet 5 mg     Code Status: Limited: Do not attempt resuscitation (DNR) -DNR-LIMITED -Do Not Intubate/DNI   Family Communication: None Disposition Plan: Plan to discharge to SNF   Status is: Inpatient Remains inpatient appropriate because: Fluid overload       Subjective:   Interval events noted.  She feels a little short of breath.  No other complaints.  Objective:    Vitals:   11/15/23 1500 11/15/23 2015 11/16/23 0455 11/16/23 0750  BP: (!) 150/52 (!) 153/43 (!) 135/44 (!) 148/52  Pulse: (!) 50 (!) 50 (!) 47 (!) 50  Resp: 18   18  Temp: 98.2 F (36.8 C) 98.1 F (36.7 C) 98.5 F (36.9  C) 98 F (36.7 C)  TempSrc: Oral Oral Oral   SpO2: 98% 100% 100% 100%  Weight:   (!) 141.8 kg   Height:       No data found.   Intake/Output Summary (Last 24 hours) at 11/16/2023 1626 Last data filed at 11/16/2023 0950 Gross per 24 hour  Intake 300 ml  Output 1100 ml  Net -800 ml   Filed Weights   11/14/23 0435 11/15/23 0500 11/16/23 0455  Weight: (!) 138.2 kg (!) 144 kg (!) 141.8 kg    Exam:   GEN: NAD SKIN: Warm and dry EYES: No pallor or icterus ENT: MMM CV: RRR PULM: CTA B ABD: soft, soft, distended with fluid, NT, +BS CNS: AAO x 3, non focal EXT: Bilateral leg edema.  No tenderness       Data Reviewed:   I  have personally reviewed following labs and imaging studies:  Labs: Labs show the following:   Basic Metabolic Panel: Recent Labs  Lab 11/11/23 1503 11/12/23 0405 11/14/23 0516 11/15/23 0517 11/16/23 0744  NA 142 140 140 139 142  K 4.7 4.6 4.6 4.5 4.8  CL 100 103 100 99 100  CO2 33* 30 33* 31 30  GLUCOSE 183* 135* 114* 115* 114*  BUN 50* 52* 57* 57* 56*  CREATININE 1.48* 1.50* 1.90* 1.69* 1.75*  CALCIUM  9.3 8.8* 9.0 8.9 9.2  MG  --   --   --   --  2.6*  PHOS  --   --   --  3.6  --    GFR Estimated Creatinine Clearance: 42.1 mL/min (A) (by C-G formula based on SCr of 1.75 mg/dL (H)). Liver Function Tests: Recent Labs  Lab 11/11/23 1414 11/11/23 1503 11/15/23 0517  AST 22 21  --   ALT 14 14  --   ALKPHOS 59 55  --   BILITOT 0.9 1.0  --   PROT 6.4* 6.2*  --   ALBUMIN  3.7 3.4* 3.3*   No results for input(s): LIPASE, AMYLASE in the last 168 hours. No results for input(s): AMMONIA in the last 168 hours. Coagulation profile No results for input(s): INR, PROTIME in the last 168 hours.  CBC: Recent Labs  Lab 11/11/23 1414 11/11/23 1503 11/12/23 0405 11/13/23 0438 11/14/23 0516 11/15/23 0517  WBC 8.7 7.4 7.3 7.9 7.0 7.2  NEUTROABS 7.6 6.4  --   --   --   --   HGB 10.3* 9.7* 8.4* 8.4* 8.0* 8.2*  HCT 33.9* 32.7*  27.9* 28.1* 27.2* 27.0*  MCV 105.6* 105.8* 106.5* 108.1* 107.1* 106.3*  PLT 154 139* 122* 121* 117* 127*   Cardiac Enzymes: No results for input(s): CKTOTAL, CKMB, CKMBINDEX, TROPONINI in the last 168 hours. BNP (last 3 results) No results for input(s): PROBNP in the last 8760 hours. CBG: Recent Labs  Lab 11/15/23 1210 11/15/23 1655 11/15/23 2017 11/16/23 0747 11/16/23 1210  GLUCAP 163* 155* 170* 114* 145*   D-Dimer: No results for input(s): DDIMER in the last 72 hours. Hgb A1c: No results for input(s): HGBA1C in the last 72 hours. Lipid Profile: No results for input(s): CHOL, HDL, LDLCALC, TRIG, CHOLHDL, LDLDIRECT in the last 72 hours. Thyroid  function studies: No results for input(s): TSH, T4TOTAL, T3FREE, THYROIDAB in the last 72 hours.  Invalid input(s): FREET3 Anemia work up: No results for input(s): VITAMINB12, FOLATE, FERRITIN, TIBC, IRON , RETICCTPCT in the last 72 hours. Sepsis Labs: Recent Labs  Lab 11/12/23 0405 11/13/23 0438 11/14/23 0516 11/15/23 0517  WBC 7.3 7.9 7.0 7.2    Microbiology No results found for this or any previous visit (from the past 240 hours).  Procedures and diagnostic studies:  No results found.             LOS: 3 days   [name]  [facility]   Pager on [email or web address]. If 7PM-7AM, please contact night-coverage at [email or web address]     11/16/2023, 4:26 PM

## 2023-11-16 NOTE — Progress Notes (Signed)
 PT Cancellation Note  Patient Details Name: [name] MRN: [id number] DOB: March 15, 1956   Cancelled Treatment:    Reason Eval/Treat Not Completed: Fatigue/lethargy limiting ability to participate. Upon entry pt politely declining PT at this moment due to fatigue from earlier bout of ambulation to the bathroom and getting a bath. Pt is agreeable to afternoon session if time allows. PT to re-attempt as able.   Dorina HERO. Fairly IV, PT, DPT Physical Therapist- [place]  [facility] 11/16/2023, 11:19 AM

## 2023-11-16 NOTE — Progress Notes (Signed)
 Physical Therapy Treatment Patient Details Name: [name] MRN: [id number] DOB: [birth year] Today's Date: 11/16/2023   History of Present Illness [name] is a 67yoF 11/02/2023 after intentional ingestion of oxycodone  c SI. PMH: MDD, GAD,HTN, AF, DM2, DVT/PE status post IVC filter, HFpEF, single-chamber PPM and chronic pain. Pt recently started on home O2.    PT Comments  Pt received upright in recliner agreeable to PT. Able to progress mobility some this date but only to ~20' in room. Pt has significant DOE with minimal household distances leading to prolonged seated rest breaks. Is able to perform BM and some pericare but is dependent for further pericare to ensure cleanliness. Pt with desaturation from 95% on 3 L/min prior to mobility down to 90% post mobility upright in bed. Education provided on energy conservation and slowly performing multiple, short gait bouts to build endurance. Pt understanding with all needs in reach. D/c recs remain appropriate.      If plan is discharge home, recommend the following: A little help with walking and/or transfers;A lot of help with bathing/dressing/bathroom;Assistance with cooking/housework;Assist for transportation;Help with stairs or ramp for entrance   Can travel by private vehicle        Equipment Recommendations  None recommended by PT    Recommendations for Other Services       Precautions / Restrictions Precautions Precautions: Fall Recall of Precautions/Restrictions: Intact Restrictions Weight Bearing Restrictions Per Provider Order: No     Mobility  Bed Mobility Overal bed mobility: Needs Assistance Bed Mobility: Sit to Supine       Sit to supine: Supervision     Patient Response: Cooperative  Transfers Overall transfer level: Needs assistance Equipment used: Rolling walker (2 wheels) Transfers: Sit to/from Stand Sit to Stand: Supervision, Modified independent (Device/Increase time)           General  transfer comment: supervision from recliner. Mod-I from elevated commode seat in bathroom    Ambulation/Gait Ambulation/Gait assistance: Supervision Gait Distance (Feet): 20 Feet Assistive device: Rolling walker (2 wheels) Gait Pattern/deviations: Step-through pattern       General Gait Details: ambulating around foot of bed to bathroom then back to EOB closest to bathroom door.   Stairs             Wheelchair Mobility     Tilt Bed Tilt Bed Patient Response: Cooperative  Modified Rankin (Stroke Patients Only)       Balance Overall balance assessment: Needs assistance Sitting-balance support: Single extremity supported Sitting balance-[name]: Good     Standing balance support: Single extremity supported, During functional activity Standing balance-[name]: Fair                              Hotel manager: No apparent difficulties  Cognition Arousal: Alert Behavior During Therapy: WFL for tasks assessed/performed, Flat affect   PT - Cognitive impairments: No apparent impairments                         Following commands: Intact      Cueing Cueing Techniques: Verbal cues  Exercises      General Comments General comments (skin integrity, edema, etc.): 90% post gait on 3 L/min via [place].      Pertinent Vitals/Pain Pain Assessment Pain Assessment: No/denies pain    Home Living  Prior Function            PT Goals (current goals can now be found in the care plan section) Acute Rehab PT Goals Patient Stated Goal: transition to LTC or progress function for safer independence PT Goal Formulation: With patient Time For Goal Achievement: 11/27/23 Potential to Achieve Goals: Fair Progress towards PT goals: Progressing toward goals    Frequency    Min 2X/week      PT Plan      Co-evaluation              AM-PAC PT 6 Clicks Mobility   Outcome  Measure  Help needed turning from your back to your side while in a flat bed without using bedrails?: A Lot Help needed moving from lying on your back to sitting on the side of a flat bed without using bedrails?: A Lot Help needed moving to and from a bed to a chair (including a wheelchair)?: A Little Help needed standing up from a chair using your arms (e.g., wheelchair or bedside chair)?: A Little Help needed to walk in hospital room?: A Little Help needed climbing 3-5 steps with a railing? : A Lot 6 Click Score: 15    End of Session Equipment Utilized During Treatment: Oxygen Activity Tolerance: Patient limited by fatigue Patient left: in bed;with call bell/phone within reach;with bed alarm set Nurse Communication: Mobility status PT Visit Diagnosis: Difficulty in walking, not elsewhere classified (R26.2);Other abnormalities of gait and mobility (R26.89);Muscle weakness (generalized) (M62.81)     Time: 1340-1400 PT Time Calculation (min) (ACUTE ONLY): 20 min  Charges:    $Therapeutic Activity: 8-22 mins PT General Charges $$ ACUTE PT VISIT: 1 Visit                     Dorina HERO. Fairly IV, PT, DPT Physical Therapist- [place]  [facility] 11/16/2023, 3:36 PM

## 2023-11-17 LAB — BASIC METABOLIC PANEL WITH GFR
Anion gap: 14 (ref 5–15)
BUN: 54 mg/dL — ABNORMAL HIGH (ref 8–23)
CO2: 31 mmol/L (ref 22–32)
Calcium: 9.1 mg/dL (ref 8.9–10.3)
Chloride: 97 mmol/L — ABNORMAL LOW (ref 98–111)
Creatinine, Ser: 1.69 mg/dL — ABNORMAL HIGH (ref 0.44–1.00)
GFR, Estimated: 33 mL/min — ABNORMAL LOW (ref >60)
Glucose, Bld: 110 mg/dL — ABNORMAL HIGH (ref 70–99)
Potassium: 4.6 mmol/L (ref 3.5–5.1)
Sodium: 142 mmol/L (ref 135–145)

## 2023-11-17 LAB — GLUCOSE, CAPILLARY
Glucose-Capillary: 98 mg/dL (ref 70–99)
Glucose-Capillary: 177 mg/dL — ABNORMAL HIGH (ref 70–99)
Glucose-Capillary: 196 mg/dL — ABNORMAL HIGH (ref 70–99)
Glucose-Capillary: 244 mg/dL — ABNORMAL HIGH (ref 70–99)

## 2023-11-17 LAB — IRON AND TIBC
Iron: 33 ug/dL (ref 28–170)
Saturation Ratios: 14 % (ref 10.4–31.8)
TIBC: 245 ug/dL — ABNORMAL LOW (ref 250–450)
UIBC: 212 ug/dL

## 2023-11-17 LAB — VITAMIN B12: Vitamin B-12: 480 pg/mL (ref 180–914)

## 2023-11-17 LAB — MAGNESIUM: Magnesium: 2.5 mg/dL — ABNORMAL HIGH (ref 1.7–2.4)

## 2023-11-17 LAB — VITAMIN D 25 HYDROXY (VIT D DEFICIENCY, FRACTURES): Vit D, 25-Hydroxy: 32.5 ng/mL (ref 30–100)

## 2023-11-17 LAB — PHOSPHORUS: Phosphorus: 3.3 mg/dL (ref 2.5–4.6)

## 2023-11-17 NOTE — Consult Note (Addendum)
 WOC Nurse wound follow up Wound type: Unna boot bilateral leg. LLE with friable wounds below the knee on L leg. - Two full thickness on L leg.  Measurement: 3 cm x 1.5 cm x 0.1 cm and 2 cm x 1 cm x 0.1 cm Wound bed: 100% red, biofilm. - One partial thickness on L ankle Measurement: 1.5 cm x 1 cm x 0.1 cm Wound bed: 100% red. - One full thickness on R leg Measurement: 3.5 cm x 3 cm x 0.1 cm Wound bed: 100% red Drainage (amount, consistency, odor) Minimum amount, no odor, serosanguinous. Periwound: intact, reticular veins, hemosiderin staining, minimum dry skin with scabs. Dressing procedure/placement/frequency: Cleanse with warm water, pat dry the peri-wound skin. Moist with barrier cream. Wrap the leg with Unna boot and Coban since the base of toes, to the distal knee. Change every 7 days.  WOC team will follow MON or TUE. Please reconsult if further assistance is needed. Thank-you,  Lela Holm BSN, CNS, RN, [employer], WOCN  (Phone [phone])

## 2023-11-17 NOTE — Plan of Care (Signed)

## 2023-11-17 NOTE — TOC Progression Note (Addendum)
 Transition of Care [facility]) - Progression Note    Patient Details  Name: [name] MRN: [id number] Date of Birth: [birth year]  [facility] CM/SW Contact  Dalia GORMAN Fuse, RN Phone Number: 11/17/2023, 9:19 AM  Clinical Narrative:     Medicaid screening completed by Vernell Gully. The patient is appropriate for Medicaid application. The application process has been started.                   Expected Discharge Plan and Services                                               Social Drivers of Health (SDOH) Interventions SDOH Screenings   Food Insecurity: No Food Insecurity (11/11/2023)  Housing: Low Risk  (11/11/2023)  Recent Concern: Housing - High Risk (11/05/2023)  Transportation Needs: No Transportation Needs (11/11/2023)  Utilities: Not At Risk (11/11/2023)  Alcohol Screen: Low Risk  (11/05/2023)  Social Connections: Moderately Isolated (11/11/2023)  Tobacco Use: Medium Risk (11/11/2023)    Readmission Risk Interventions     No data to display

## 2023-11-17 NOTE — Progress Notes (Signed)
 Occupational Therapy Treatment Patient Details Name: [name] MRN: [id number] DOB: 1955/04/18 Today's Date: 11/17/2023   History of present illness [name] is a 67yoF 11/02/2023 after intentional ingestion of oxycodone  c SI. PMH: MDD, GAD,HTN, AF, DM2, DVT/PE status post IVC filter, HFpEF, single-chamber PPM and chronic pain. Pt recently started on home O2.   OT comments  Pt is seated on toilet with nurse present on arrival. She denies pain. Pt with notable SOB, however sp02 at 95% on 3L. Edu provided on PLB and pacing during all activities. Pt verbalized understanding and demo back PLB techniques. She required supervision to stand from [facility] over toilet and Max A for standing peri-care. Pt again with notable fatigue, only able to ambulate short distance back to EOB before requiring seated rest break. Sp02 remained stable on 3L and continued PLB performed with pt also noted with nausea and dry heaving. She declined getting to recliner and wished to return to bed to rest at this time. Supervision to return to supine with HOB elevated to 30 degrees. She will cont to require skilled acute OT services to maximize her safety and IND to return to PLOF.       If plan is discharge home, recommend the following:  A little help with bathing/dressing/bathroom;Help with stairs or ramp for entrance   Equipment Recommendations  Other (comment) (RW)    Recommendations for Other Services      Precautions / Restrictions Precautions Precautions: Fall Recall of Precautions/Restrictions: Intact Restrictions Weight Bearing Restrictions Per Provider Order: No       Mobility Bed Mobility Overal bed mobility: Needs Assistance Bed Mobility: Sit to Supine       Sit to supine: Supervision        Transfers Overall transfer level: Needs assistance Equipment used: Rolling walker (2 wheels) Transfers: Sit to/from Stand Sit to Stand: Supervision, Modified independent (Device/Increase  time)           General transfer comment: from EOB to RW to scoot towards HOB and ambulated from bathroom using RW with CGA/SBA     Balance Overall balance assessment: Needs assistance Sitting-balance support: Single extremity supported Sitting balance-[name]: Good     Standing balance support: During functional activity, Reliant on assistive device for balance Standing balance-[name]: Fair Standing balance comment: CGA and RW use                           ADL either performed or assessed with clinical judgement   ADL Overall ADL's : Needs assistance/impaired                         Toilet Transfer: Contact guard assist;Comfort height toilet;Rolling walker (2 wheels)   Toileting- Clothing Manipulation and Hygiene: Maximal assistance;Sit to/from stand         General ADL Comments: CGA using RW for toilet transfer with BSC over toilet; Max A in standing for peri-care; pt with notable DOE however sp02 94% and higher during session; cues for PLB    Extremity/Trunk Assessment              Vision       Perception     Praxis     Communication Communication Communication: No apparent difficulties   Cognition Arousal: Alert Behavior During Therapy: WFL for tasks assessed/performed, Flat affect  Following commands: Intact        Cueing   Cueing Techniques: Verbal cues  Exercises Other Exercises Other Exercises: Edu on pacing and PLB techniques to prevent overexertion and maximize safety and IND.    Shoulder Instructions       General Comments 94% sp02 on 3L via [place] after ambulating from bathroom    Pertinent Vitals/ Pain       Pain Assessment Pain Assessment: No/denies pain  Home Living                                          Prior Functioning/Environment              Frequency  Min 1X/week        Progress Toward Goals  OT Goals(current goals  can now be found in the care plan section)  Progress towards OT goals: Progressing toward goals  Acute Rehab OT Goals Patient Stated Goal: go to LTC OT Goal Formulation: With patient Time For Goal Achievement: 11/27/23 Potential to Achieve Goals: Fair  Plan      Co-evaluation                 AM-PAC OT 6 Clicks Daily Activity     Outcome Measure   Help from another person eating meals?: None Help from another person taking care of personal grooming?: None Help from another person toileting, which includes using toliet, bedpan, or urinal?: A Little Help from another person bathing (including washing, rinsing, drying)?: A Lot Help from another person to put on and taking off regular upper body clothing?: A Little Help from another person to put on and taking off regular lower body clothing?: A Lot 6 Click Score: 18    End of Session Equipment Utilized During Treatment: Rolling walker (2 wheels);Oxygen  OT Visit Diagnosis: Unsteadiness on feet (R26.81);Muscle weakness (generalized) (M62.81)   Activity Tolerance Patient tolerated treatment well   Patient Left in bed;with call bell/phone within reach;with bed alarm set   Nurse Communication          Time: 9055-8995 OT Time Calculation (min): 20 min  Charges: OT General Charges $OT Visit: 1 Visit OT Treatments $Self Care/Home Management : 8-22 mins  Chenille Toor, OTR/L  11/17/23, 10:48 AM   Duwaine FORBES Saupe 11/17/2023, 10:44 AM

## 2023-11-17 NOTE — NC FL2 (Signed)
 [place]  MEDICAID FL2 LEVEL OF CARE FORM     IDENTIFICATION  Patient Name: [name] Birthdate: 08-18-1955 Sex: female Admission Date (Current Location): 11/11/2023  [facility] and [place] Number:  Chiropodist and Address:  [facility], [address] [place], [place] [address]      Provider Number: [id number]  Attending Physician Name and Address:  Von Bellis, MD  Relative Name and Phone Number:  Delores Chuck Pricilla)  [phone]    Current Level of Care: Hospital Recommended Level of Care: Skilled Nursing Facility Prior Approval Number:    Date Approved/Denied:   PASRR Number: [id number]  Discharge Plan: SNF    Current Diagnoses: Patient Active Problem List   Diagnosis Date Noted   Intractable nausea and vomiting 11/11/2023   Drug overdose, intentional, initial encounter (HCC) 11/11/2023   Polyuria 11/11/2023   CKD (chronic kidney disease) stage 4, GFR 15-29 ml/min (HCC) 11/11/2023   Chronic venous insufficiency 11/10/2023   Lower extremity ulceration, unspecified laterality, limited to breakdown of skin (HCC) 11/10/2023   MDD (major depressive disorder), recurrent severe, without psychosis (HCC) 11/05/2023   MDD (major depressive disorder), severe (HCC) 11/03/2023   Acute respiratory failure with hypoxia (HCC) 09/11/2023   Obesity hypoventilation syndrome (HCC) 09/07/2023   WHO group 3 pulmonary arterial hypertension (HCC) 09/07/2023   (HFpEF) heart failure with preserved ejection fraction (HCC) 09/03/2023   Elevated troponin 09/03/2023   Subtherapeutic international normalized ratio (INR) 09/03/2023   Presence of permanent cardiac pacemaker 09/03/2023   History of DVT (deep vein thrombosis) 09/03/2023   Pain in left hip 01/07/2023   Pain in right knee 01/07/2023   Type 2 diabetes mellitus with stage 3b chronic kidney disease, with long-term current use of insulin  (HCC) 09/09/2021   Mixed diabetic hyperlipidemia  associated with type 2 diabetes mellitus (HCC) 09/09/2021   Hypothyroidism 09/09/2021   Wound of left leg, initial encounter 09/09/2021   C6 cervical fracture (HCC) 09/01/2018   Atrial flutter (HCC) 09/01/2018   Paroxysmal atrial fibrillation (HCC) 09/01/2018   Syncope 08/29/2018   Symptomatic advanced heart block 08/29/2018   Acute on chronic diastolic CHF (congestive heart failure) (HCC)    COPD (chronic obstructive pulmonary disease) (HCC) 09/12/2015   Personal history of endocrine, metabolic or immunity disorder 87/72/7983   Decreased mobility 04/03/2015   Deep vein thrombosis (DVT) of both lower extremities (HCC) 02/24/2015   Chronic kidney disease, stage 3b (HCC) 02/24/2015   Long term current use of anticoagulant therapy 11/19/2014   Osteoarthritis 08/31/2014   Cellulitis of hand, right 08/31/2014   Tremors  05/07/2012   Obstructive sleep apnea 05/03/2012   Thrombocytopenia (HCC) 08/18/2011   Diastolic dysfunction, grade 2, EF 65-70% May 2013 08/14/2011   LBBB (left bundle branch block) 08/14/2011   Angina pectoris (HCC) 08/13/2011   Morbid obesity with BMI of 50.0-59.9, adult (HCC) 08/13/2011   Essential hypertension 08/13/2011   Anemia 08/13/2011   History of pulmonary embolism, recurrent, 3 seperate episodes. 08/13/2011   Presence of IVC filter, placed June 2011 after 3d PE 08/13/2011   Pulmonary HTN, Moderate  PA pressure 09/2009 08/13/2011    Orientation RESPIRATION BLADDER Height & Weight     Self, Time, Situation, Place    Incontinent Weight: (!) 141.8 kg Height:  5' 1 (154.9 cm)  BEHAVIORAL SYMPTOMS/MOOD NEUROLOGICAL BOWEL NUTRITION STATUS      Incontinent    AMBULATORY STATUS COMMUNICATION OF NEEDS Skin     Verbally  Personal Care Assistance Level of Assistance              Functional Limitations Info             SPECIAL CARE FACTORS FREQUENCY  PT (By licensed PT), OT (By licensed OT)     PT Frequency: 5 x  week OT Frequency: 5 x week            Contractures      Additional Factors Info  Code Status Code Status Info: FULL             Current Medications (11/17/2023):  This is the current hospital active medication list Current Facility-Administered Medications  Medication Dose Route Frequency Provider Last Rate Last Admin   allopurinol  (ZYLOPRIM ) tablet 50 mg  50 mg Oral QODAY Cox, Amy N, DO   50 mg at 11/16/23 0815   alum & mag hydroxide-simeth (MAALOX/MYLANTA) 200-200-20 MG/5ML suspension 30 mL  30 mL Oral Q4H PRN Jadapalle, Sree, MD       amLODipine  (NORVASC ) tablet 10 mg  10 mg Oral Daily Cox, Amy N, DO   10 mg at 11/17/23 9070   apixaban  (ELIQUIS ) tablet 5 mg  5 mg Oral BID Ayiku, Bernard, MD   5 mg at 11/17/23 9070   ARIPiprazole  (ABILIFY ) tablet 2 mg  2 mg Oral QHS Jadapalle, Sree, MD   2 mg at 11/16/23 2127   citalopram  (CELEXA ) tablet 40 mg  40 mg Oral QHS Jadapalle, Sree, MD   40 mg at 11/16/23 2128   diazepam  (VALIUM ) tablet 5 mg  5 mg Oral BID PRN Cox, Amy N, DO       feeding supplement (ENSURE PLUS HIGH PROTEIN) liquid 237 mL  237 mL Oral BID BM Fausto Sor A, DO   237 mL at 11/17/23 0930   ferrous sulfate  tablet 325 mg  325 mg Oral Daily Jadapalle, Sree, MD   325 mg at 11/17/23 9071   furosemide  (LASIX ) injection 40 mg  40 mg Intravenous BID Fausto Sor A, DO   40 mg at 11/17/23 0930   guaiFENesin -dextromethorphan  (ROBITUSSIN DM) 100-10 MG/5ML syrup 5 mL  5 mL Oral Q6H PRN Cox, Amy N, DO       insulin  aspart (novoLOG ) injection 0-15 Units  0-15 Units Subcutaneous TID WC Cox, Amy N, DO   3 Units at 11/17/23 1245   insulin  aspart (novoLOG ) injection 0-5 Units  0-5 Units Subcutaneous QHS Cox, Amy N, DO   2 Units at 11/16/23 2128   insulin  glargine-yfgn (SEMGLEE ) injection 20 Units  20 Units Subcutaneous Daily Cox, Amy N, DO   20 Units at 11/16/23 2128   levothyroxine  (SYNTHROID ) tablet 75 mcg  75 mcg Oral QAC breakfast Jadapalle, Sree, MD   75 mcg at 11/17/23 9487    melatonin tablet 10 mg  10 mg Oral QHS Cox, Amy N, DO   10 mg at 11/16/23 2128   multivitamin with minerals tablet 1 tablet  1 tablet Oral Daily Jadapalle, Sree, MD   1 tablet at 11/17/23 9071   neomycin -bacitracin -polymyxin [phone] OINT 1 Application  1 Application Apply externally BID PRN Jadapalle, Sree, MD       nystatin  (MYCOSTATIN /NYSTOP ) topical powder   Topical TID PRN Jadapalle, Sree, MD       Oral care mouth rinse  15 mL Mouth Rinse PRN Cox, Amy N, DO       oxyCODONE  (Oxy IR/ROXICODONE ) immediate release tablet 5 mg  5 mg Oral Q6H PRN Jadapalle, Sree,  MD       polyethylene glycol (MIRALAX  / GLYCOLAX ) packet 17 g  17 g Oral Daily Fausto Sor A, DO   17 g at 11/17/23 9071   rosuvastatin  (CRESTOR ) tablet 10 mg  10 mg Oral Daily Jadapalle, Sree, MD   10 mg at 11/17/23 9070   senna-docusate (Senokot-S) tablet 1 tablet  1 tablet Oral BID Fausto Sor A, DO   1 tablet at 11/17/23 9071     Discharge Medications: Please see discharge summary for a list of discharge medications.  Relevant Imaging Results:  Relevant Lab Results:   Additional Information SSN: [id number]  Dalia GORMAN Fuse, RN

## 2023-11-17 NOTE — Progress Notes (Signed)
 PT Cancellation Note  Patient Details Name: [name] MRN: [id number] DOB: [birth year]   Cancelled Treatment:    Reason Eval/Treat Not Completed: Other (comment). Pt received upright in bed. Pleasantly declining PT due to N/V. Encouraged pt to continue ambulation to / from bathroom with staff and to sit in recliner to maintain functional mobility. Pt understanding. Will re-attempt at a later time/date.   Dorina HERO. Fairly IV, PT, DPT Physical Therapist- [place]  [facility] 11/17/2023, 1:45 PM

## 2023-11-17 NOTE — Plan of Care (Signed)
  Problem: Education: Goal: Knowledge of General Education information will improve Description: Including pain rating scale, medication(s)/side effects and non-pharmacologic comfort measures Outcome: Progressing   Problem: Health Behavior/Discharge Planning: Goal: Ability to manage health-related needs will improve Outcome: Progressing   Problem: Nutrition: Goal: Adequate nutrition will be maintained Outcome: Progressing   Problem: Elimination: Goal: Will not experience complications related to bowel motility Outcome: Progressing Goal: Will not experience complications related to urinary retention Outcome: Progressing   Problem: Activity: Goal: Risk for activity intolerance will decrease Outcome: Not Progressing   Problem: Pain Managment: Goal: General experience of comfort will improve and/or be controlled Outcome: Adequate for Discharge

## 2023-11-17 NOTE — Consult Note (Addendum)
 WOC Nurse wound follow up Wound type: Unna boot bilateral leg. LLE with friable wounds below the knee on L leg. - Two full thickness on L leg.  Measurement: 3 cm x 1.5 cm x 0.1 cm and 2 cm x 1 cm x 0.1 cm Wound bed: 100% red, biofilm. - One partial thickness on L ankle Measurement: 1.5 cm x 1 cm x 0.1 cm Wound bed: 100% red. - One full thickness on R leg Measurement: 3.5 cm x 3 cm x 0.1 cm Wound bed: 100% red Drainage (amount, consistency, odor) Minimum amount, no odor, serosanguinous. Periwound: intact, reticular veins, hemosiderin staining, minimum dry skin with scabs. Dressing procedure/placement/frequency: Cleanse with warm water, pat dry the peri-wound skin. Moist with barrier cream. Wrap the leg with Unna boot and Coban since the base of toes, to the distal knee. Change every 7 days.  WOC team will follow MON or TUE. Please reconsult if further assistance is needed. Thank-you,  Lela Holm BSN, CNS, RN, [employer], WOCN  (Phone [phone])

## 2023-11-17 NOTE — Progress Notes (Signed)
 Progress Note    [name]  FMW:[id number] DOB: Jul 19, 1955  DOA: 11/11/2023 PCP: Valma Carwin, MD      Brief Narrative:    Medical records reviewed and are as summarized below:  [name] is a 68 y.o. female with pulmonary hypertension, hypertension, atrial fibrillation on Eliquis , history of DVT/PE with IVC, heart failure preserved ejection fraction, morbid obesity, CKD stage IV, chronic respiratory failure requiring 2 L nasal cannula at baseline, anxiety, insulin -dependent diabetes mellitus, who presented to the emergency department on 11/02/2023 because of intentional overdose of OxyContin  on 11/01/2023.  She was admitted to the behavioral health unit on 11/06/2023 for further management. Hospitalist team was consulted on 11/21/2023 because of concerns of intractable nausea and vomiting.  She also complained of constipation for about 2 weeks.  Patient was also found to have weeping edema from the abdomen and weeping edema from left leg with bloody drainage soaking through left Unna boot.     Assessment/Plan:   Principal Problem:   Intractable nausea and vomiting Active Problems:   Polyuria   Essential hypertension   Paroxysmal atrial fibrillation (HCC)   Type 2 diabetes mellitus with stage 3b chronic kidney disease, with long-term current use of insulin  (HCC)   (HFpEF) heart failure with preserved ejection fraction (HCC)   Morbid obesity with BMI of 50.0-59.9, adult (HCC)   LBBB (left bundle branch block)   Hypothyroidism   Pulmonary HTN, Moderate  PA pressure 09/2009   Thrombocytopenia (HCC)   Drug overdose, intentional, initial encounter (HCC)   CKD (chronic kidney disease) stage 4, GFR 15-29 ml/min (HCC)    Body mass index is 59.07 kg/m.  (Morbid obesity)     Intractable nausea and vomiting - resolved Suspect due to severe constipation, no BM in 2 weeks. --Bowel regimen - escalate as needed --IV antiemetics as needed --Tolerating PO intake, no  need for IV fluids --Monitor BMP   Polyuria -probably from diuresis UA with only trace leukocytes, rare bacteria, 0-5 WBC's -- not consistent with UTI. Urine culture from 05/05/2023: Showed Enterobacter cloacae, pansensitive Prior to admission, pt given Keflex  500 mg p.o. twice daily, note stated that patient received 3 days however patient only completed 5 doses total (2.5 days) of treatment 11/05/2023: First dose 2158; 2 doses were received on 11/06/2023; 2 doses were received on 11/07/2023 --Monitor urine output --Monitor fever curve and for dysuria    Acute on Chronic HFpEF, anasarca (ascites, peripheral edema)-  Continue IV Lasix .   Monitor BMP, daily weight and urine output. Torsemide  on hold. Recent echo noted EF 60-65%, moderate LVH, normal RV -Recent R/LHC noted no significant CAD, PA pressure 62/16, wedge of 11, PVR 4.4, group 3 pulmonary hypertension suspected     Chronic Lymphedema -- currently with increased, weeping edema Venous stasis ulcers of left lower extremity  Continue Unna boots on bilateral lower extremities. She had weeping edema (bloody drainage) from left leg.   Type 2 diabetes mellitus with long-term current use of insulin  (HCC) Continue Semglee  and NovoLog  as needed   Paroxysmal atrial fibrillation, bradycardia Continue Eliquis  HR in the 50s not on rate control drugs     CKD (chronic kidney disease) stage 4, GFR 15-29 ml/min (HCC) Baseline creatinine around 1.6-2  Better than baseline currently with Cr 1.5 --Monitor BMP with diuresis    Drug overdose, intentional, initial encounter (HCC) Major depressive disorder, anxiety Continue Celexa  and Abilify  Pt was initially admitted to medical floor from geropsych unit. One-on-one suicide  precautions were lifted per psychiatry on 11/12/23. Pt can be discharged when medically stable, no longer requires psych admission.    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 Yes  Diet effective now                    Consultants: Psychiatrist Palliative care Vascular surgeon  Procedures: None    Medications:    allopurinol   50 mg Oral QODAY   amLODipine   10 mg Oral Daily   apixaban   5 mg Oral BID   ARIPiprazole   2 mg Oral QHS   citalopram   40 mg Oral QHS   feeding supplement  237 mL Oral BID BM   ferrous sulfate   325 mg Oral Daily   furosemide   40 mg Intravenous BID   insulin  aspart  0-15 Units Subcutaneous TID WC   insulin  aspart  0-5 Units Subcutaneous QHS   insulin  glargine-yfgn  20 Units Subcutaneous Daily   levothyroxine   75 mcg Oral QAC breakfast   melatonin  10 mg Oral QHS   multivitamin with minerals  1 tablet Oral Daily   polyethylene glycol  17 g Oral Daily   rosuvastatin   10 mg Oral Daily   senna-docusate  1 tablet Oral BID   Continuous Infusions:   Anti-infectives (From admission, onward)    None        Family Communication/Anticipated D/C date and plan/Code Status   DVT prophylaxis: Place TED hose Start: 11/11/23 1406 apixaban  (ELIQUIS ) tablet 5 mg     Code Status: Limited: Do not attempt resuscitation (DNR) -DNR-LIMITED -Do Not Intubate/DNI   Family Communication: None Disposition Plan: Plan to discharge to SNF   Status is: Inpatient Remains inpatient appropriate because: Fluid overload       Subjective:   No significant events overnight.  Patient feels tired, breathing is getting better, no nausea vomiting.  Patient did move bowels yesterday.   Objective:    Vitals:   11/16/23 1957 11/17/23 0504 11/17/23 0752 11/17/23 1534  BP: (!) 147/48 (!) 152/42 (!) 140/51 (!) 146/49  Pulse: (!) 50 (!) 50 (!) 50 (!) 50  Resp: 18 18 18 16   Temp: 98.2 F (36.8 C) 98.1 F  (36.7 C) 98 F (36.7 C) 98.5 F (36.9 C)  TempSrc:      SpO2: 100% 100% 100% 100%  Weight:      Height:       No data found.   Intake/Output Summary (Last 24 hours) at 11/17/2023 1701 Last data filed at 11/17/2023 0900 Gross per 24 hour  Intake 480 ml  Output --  Net 480 ml   Filed Weights   11/14/23 0435 11/15/23 0500 11/16/23 0455  Weight: (!) 138.2 kg (!) 144 kg (!) 141.8 kg    Exam:   GEN: NAD SKIN: Warm and dry EYES: No pallor or icterus ENT: MMM CV: RRR PULM: Equal air entry bilaterally, mild bilateral crackles, no wheezes ABD: soft, soft, distended with fluid, NT, +BS CNS: AAO x 3, non focal EXT: 2-3+ bilateral leg edema.  No tenderness, Unna boots intact    Data Reviewed:   I have personally reviewed following labs and imaging  studies:  Labs: Labs show the following:   Basic Metabolic Panel: Recent Labs  Lab 11/12/23 0405 11/14/23 0516 11/15/23 0517 11/16/23 0744 11/17/23 0534 11/17/23 1113  NA 140 140 139 142 142  --   K 4.6 4.6 4.5 4.8 4.6  --   CL 103 100 99 100 97*  --   CO2 30 33* 31 30 31   --   GLUCOSE 135* 114* 115* 114* 110*  --   BUN 52* 57* 57* 56* 54*  --   CREATININE 1.50* 1.90* 1.69* 1.75* 1.69*  --   CALCIUM  8.8* 9.0 8.9 9.2 9.1  --   MG  --   --   --  2.6*  --  2.5*  PHOS  --   --  3.6  --   --  3.3   GFR Estimated Creatinine Clearance: 43.5 mL/min (A) (by C-G formula based on SCr of 1.69 mg/dL (H)). Liver Function Tests: Recent Labs  Lab 11/11/23 1414 11/11/23 1503 11/15/23 0517  AST 22 21  --   ALT 14 14  --   ALKPHOS 59 55  --   BILITOT 0.9 1.0  --   PROT 6.4* 6.2*  --   ALBUMIN  3.7 3.4* 3.3*   No results for input(s): LIPASE, AMYLASE in the last 168 hours. No results for input(s): AMMONIA in the last 168 hours. Coagulation profile No results for input(s): INR, PROTIME in the last 168 hours.  CBC: Recent Labs  Lab 11/11/23 1414 11/11/23 1503 11/12/23 0405 11/13/23 0438 11/14/23 0516  11/15/23 0517  WBC 8.7 7.4 7.3 7.9 7.0 7.2  NEUTROABS 7.6 6.4  --   --   --   --   HGB 10.3* 9.7* 8.4* 8.4* 8.0* 8.2*  HCT 33.9* 32.7* 27.9* 28.1* 27.2* 27.0*  MCV 105.6* 105.8* 106.5* 108.1* 107.1* 106.3*  PLT 154 139* 122* 121* 117* 127*   Cardiac Enzymes: No results for input(s): CKTOTAL, CKMB, CKMBINDEX, TROPONINI in the last 168 hours. BNP (last 3 results) No results for input(s): PROBNP in the last 8760 hours. CBG: Recent Labs  Lab 11/16/23 1639 11/16/23 1958 11/17/23 0753 11/17/23 1207 11/17/23 1613  GLUCAP 146* 230* 98 177* 196*   D-Dimer: No results for input(s): DDIMER in the last 72 hours. Hgb A1c: No results for input(s): HGBA1C in the last 72 hours. Lipid Profile: No results for input(s): CHOL, HDL, LDLCALC, TRIG, CHOLHDL, LDLDIRECT in the last 72 hours. Thyroid  function studies: No results for input(s): TSH, T4TOTAL, T3FREE, THYROIDAB in the last 72 hours.  Invalid input(s): FREET3 Anemia work up: Recent Labs    11/17/23 1113  VITAMINB12 480  TIBC 245*  IRON  33   Sepsis Labs: Recent Labs  Lab 11/12/23 0405 11/13/23 0438 11/14/23 0516 11/15/23 0517  WBC 7.3 7.9 7.0 7.2    Microbiology No results found for this or any previous visit (from the past 240 hours).  Procedures and diagnostic studies:  No results found.     LOS: 4 days   Tamkia Temples Von  [facility]   Pager on [email or web address]. If 7PM-7AM, please contact night-coverage at [email or web address]     11/17/2023, 5:01 PM

## 2023-11-18 DIAGNOSIS — I5033 Acute on chronic diastolic (congestive) heart failure: Secondary | ICD-10-CM

## 2023-11-18 LAB — GLUCOSE, CAPILLARY
Glucose-Capillary: 142 mg/dL — ABNORMAL HIGH (ref 70–99)
Glucose-Capillary: 133 mg/dL — ABNORMAL HIGH (ref 70–99)
Glucose-Capillary: 120 mg/dL — ABNORMAL HIGH (ref 70–99)
Glucose-Capillary: 211 mg/dL — ABNORMAL HIGH (ref 70–99)

## 2023-11-18 LAB — BASIC METABOLIC PANEL WITH GFR
Anion gap: 11 (ref 5–15)
BUN: 50 mg/dL — ABNORMAL HIGH (ref 8–23)
CO2: 32 mmol/L (ref 22–32)
Calcium: 9.5 mg/dL (ref 8.9–10.3)
Chloride: 98 mmol/L (ref 98–111)
Creatinine, Ser: 1.59 mg/dL — ABNORMAL HIGH (ref 0.44–1.00)
GFR, Estimated: 35 mL/min — ABNORMAL LOW (ref >60)
Glucose, Bld: 116 mg/dL — ABNORMAL HIGH (ref 70–99)
Potassium: 4.8 mmol/L (ref 3.5–5.1)
Sodium: 141 mmol/L (ref 135–145)

## 2023-11-18 LAB — CBC
HCT: 30.6 % — ABNORMAL LOW (ref 36.0–46.0)
Hemoglobin: 9 g/dL — ABNORMAL LOW (ref 12.0–15.0)
MCH: 31.5 pg (ref 26.0–34.0)
MCHC: 29.4 g/dL — ABNORMAL LOW (ref 30.0–36.0)
MCV: 107 fL — ABNORMAL HIGH (ref 80.0–100.0)
Platelets: 154 K/uL (ref 150–400)
RBC: 2.86 MIL/uL — ABNORMAL LOW (ref 3.87–5.11)
RDW: 15.9 % — ABNORMAL HIGH (ref 11.5–15.5)
WBC: 7.3 K/uL (ref 4.0–10.5)
nRBC: 0 % (ref 0.0–0.2)

## 2023-11-18 MED ORDER — PANTOPRAZOLE SODIUM 40 MG PO TBEC
40.0000 mg | DELAYED_RELEASE_TABLET | Freq: Every day | ORAL | Status: DC
  Administered 2023-11-19 – 2023-12-06 (×17): 40 mg via ORAL
  Filled 2023-11-18 (×19): qty 1

## 2023-11-18 MED ORDER — ONDANSETRON HCL 4 MG/2ML IJ SOLN
4.0000 mg | Freq: Four times a day (QID) | INTRAMUSCULAR | Status: DC | PRN
  Administered 2023-11-18 – 2023-12-05 (×19): 4 mg via INTRAVENOUS
  Filled 2023-11-18 (×17): qty 2

## 2023-11-18 MED ORDER — PANTOPRAZOLE SODIUM 40 MG IV SOLR
40.0000 mg | Freq: Two times a day (BID) | INTRAVENOUS | Status: AC
Start: 2023-11-18 — End: 2023-11-18
  Administered 2023-11-18 (×4): 40 mg via INTRAVENOUS
  Filled 2023-11-18 (×2): qty 10

## 2023-11-18 NOTE — Progress Notes (Signed)
 Progress Note    [name]  FMW:[id number] DOB: 1955-08-22  DOA: 11/11/2023 PCP: Valma Carwin, MD      Brief Narrative:    Medical records reviewed and are as summarized below:  [name] is a 68 y.o. female with pulmonary hypertension, hypertension, atrial fibrillation on Eliquis , history of DVT/PE with IVC, heart failure preserved ejection fraction, morbid obesity, CKD stage IV, chronic respiratory failure requiring 2 L nasal cannula at baseline, anxiety, insulin -dependent diabetes mellitus, who presented to the emergency department on 11/02/2023 because of intentional overdose of OxyContin  on 11/01/2023.  She was admitted to the behavioral health unit on 11/06/2023 for further management. Hospitalist team was consulted on 11/21/2023 because of concerns of intractable nausea and vomiting.  She also complained of constipation for about 2 weeks.  Patient was also found to have weeping edema from the abdomen and weeping edema from left leg with bloody drainage soaking through left Unna boot.     Assessment/Plan:   Principal Problem:   Intractable nausea and vomiting Active Problems:   Polyuria   Essential hypertension   Paroxysmal atrial fibrillation (HCC)   Type 2 diabetes mellitus with stage 3b chronic kidney disease, with long-term current use of insulin  (HCC)   (HFpEF) heart failure with preserved ejection fraction (HCC)   Morbid obesity with BMI of 50.0-59.9, adult (HCC)   LBBB (left bundle branch block)   Hypothyroidism   Pulmonary HTN, Moderate  PA pressure 09/2009   Thrombocytopenia (HCC)   Drug overdose, intentional, initial encounter (HCC)   CKD (chronic kidney disease) stage 4, GFR 15-29 ml/min (HCC)    Intractable nausea and vomiting - resolved Suspect due to severe constipation, no BM in 2 weeks. --Bowel regimen - escalate as needed --IV antiemetics as needed --Tolerating PO intake, no need for IV fluids --Monitor BMP 8/13 started PPI 40 mg  IV twice daily followed by 40 mg p.o. daily for possible acid reflux/GERD   Polyuria -probably from diuresis UA with only trace leukocytes, rare bacteria, 0-5 WBC's -- not consistent with UTI. Urine culture from 05/05/2023: Showed Enterobacter cloacae, pansensitive Prior to admission, pt given Keflex  500 mg p.o. twice daily, note stated that patient received 3 days however patient only completed 5 doses total (2.5 days) of treatment 11/05/2023: First dose 2158; 2 doses were received on 11/06/2023; 2 doses were received on 11/07/2023 --Monitor urine output --Monitor fever curve and for dysuria    Acute on Chronic HFpEF, anasarca (ascites, peripheral edema)-  Continue IV Lasix .   Monitor BMP, daily weight and urine output. Torsemide  on hold. Recent echo noted EF 60-65%, moderate LVH, normal RV -Recent R/LHC noted no significant CAD, PA pressure 62/16, wedge of 11, PVR 4.4, group 3 pulmonary hypertension suspected     Chronic Lymphedema -- currently with increased, weeping edema Venous stasis ulcers of left lower extremity  Continue Unna boots on bilateral lower extremities. She had weeping edema (bloody drainage) from left leg.   Type 2 diabetes mellitus with long-term current use of insulin  (HCC) Continue Semglee  and NovoLog  as needed   Paroxysmal atrial fibrillation, bradycardia Continue Eliquis  HR in the 50s not on rate control drugs     CKD (chronic kidney disease) stage 4, GFR 15-29 ml/min (HCC) Baseline creatinine around 1.6-2  Better than baseline currently with Cr 1.5 --Monitor BMP with diuresis    Drug overdose, intentional, initial encounter (HCC) Major depressive disorder, anxiety Continue Celexa  and Abilify  Pt was initially admitted to medical floor  from geropsych unit. One-on-one suicide precautions were lifted per psychiatry on 11/12/23. Pt can be discharged when medically stable, no longer requires psych admission.    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  (Morbid obesity)  Diet Order             Diet Carb Modified Fluid consistency: Thin; Room service appropriate? Yes  Diet effective now                    Consultants: Psychiatrist Palliative care Vascular surgeon  Procedures: None    Medications:    allopurinol   50 mg Oral QODAY   amLODipine   10 mg Oral Daily   apixaban   5 mg Oral BID   ARIPiprazole   2 mg Oral QHS   citalopram   40 mg Oral QHS   feeding supplement  237 mL Oral BID BM   ferrous sulfate   325 mg Oral Daily   furosemide   40 mg Intravenous BID   insulin  aspart  0-15 Units Subcutaneous TID WC   insulin  aspart  0-5 Units Subcutaneous QHS   insulin  glargine-yfgn  20 Units Subcutaneous Daily   levothyroxine   75 mcg Oral QAC breakfast   melatonin  10 mg Oral QHS   multivitamin with minerals  1 tablet Oral Daily   pantoprazole  (PROTONIX ) IV  40 mg Intravenous Q12H   Followed by   NOREEN ON 11/19/2023] pantoprazole   40 mg Oral Daily   polyethylene glycol  17 g Oral Daily   rosuvastatin   10 mg Oral Daily   senna-docusate  1 tablet Oral BID   Continuous Infusions:   Anti-infectives (From admission, onward)    None        Family Communication/Anticipated D/C date and plan/Code Status   DVT prophylaxis: Place TED hose Start: 11/11/23 1406 apixaban  (ELIQUIS ) tablet 5 mg     Code Status: Limited: Do not attempt resuscitation (DNR) -DNR-LIMITED -Do Not Intubate/DNI   Family Communication: None Disposition Plan: Plan to discharge to SNF   Status is: Inpatient Remains inpatient appropriate  because: Fluid overload       Subjective:   No significant events overnight.  In the morning patient was nauseous, which resolved with Zofran .  Patient denied any other complaints, still has significant volume overload and edema.  Lasix  will be given. Patient denied any other complaints.    Objective:    Vitals:   11/18/23 0500 11/18/23 0907 11/18/23 1246 11/18/23 1545  BP:  (!) 145/50 (!) 146/45 (!) 142/50  Pulse:  (!) 49 (!) 50   Resp:  18  18  Temp:  97.9 F (36.6 C)  98.3 F (36.8 C)  TempSrc:      SpO2:  99%  100%  Weight: (!) 141 kg     Height:       No data found.   Intake/Output Summary (Last 24 hours) at 11/18/2023 1655 Last data filed at 11/18/2023 1500 Gross per 24 hour  Intake 1140 ml  Output 300 ml  Net 840 ml   Filed Weights   11/15/23 0500 11/16/23 0455  11/18/23 0500  Weight: (!) 144 kg (!) 141.8 kg (!) 141 kg    Exam:   GEN: NAD SKIN: Warm and dry EYES: No pallor or icterus ENT: MMM CV: RRR PULM: Equal air entry bilaterally, mild bilateral crackles, no wheezes ABD: soft, soft, distended with fluid, NT, +BS CNS: AAO x 3, non focal EXT: 2-3+ edema, Unna boots intact, most of the edema is in her thighs and abdomen/pannus  No tenderness,    Data Reviewed:   I have personally reviewed following labs and imaging studies:  Labs: Labs show the following:   Basic Metabolic Panel: Recent Labs  Lab 11/14/23 0516 11/15/23 0517 11/16/23 0744 11/17/23 0534 11/17/23 1113 11/18/23 1050  NA 140 139 142 142  --  141  K 4.6 4.5 4.8 4.6  --  4.8  CL 100 99 100 97*  --  98  CO2 33* 31 30 31   --  32  GLUCOSE 114* 115* 114* 110*  --  116*  BUN 57* 57* 56* 54*  --  50*  CREATININE 1.90* 1.69* 1.75* 1.69*  --  1.59*  CALCIUM  9.0 8.9 9.2 9.1  --  9.5  MG  --   --  2.6*  --  2.5*  --   PHOS  --  3.6  --   --  3.3  --    GFR Estimated Creatinine Clearance: 46.1 mL/min (A) (by C-G formula based on SCr of 1.59 mg/dL (H)). Liver Function  Tests: Recent Labs  Lab 11/15/23 0517  ALBUMIN  3.3*   No results for input(s): LIPASE, AMYLASE in the last 168 hours. No results for input(s): AMMONIA in the last 168 hours. Coagulation profile No results for input(s): INR, PROTIME in the last 168 hours.  CBC: Recent Labs  Lab 11/12/23 0405 11/13/23 0438 11/14/23 0516 11/15/23 0517 11/18/23 1050  WBC 7.3 7.9 7.0 7.2 7.3  HGB 8.4* 8.4* 8.0* 8.2* 9.0*  HCT 27.9* 28.1* 27.2* 27.0* 30.6*  MCV 106.5* 108.1* 107.1* 106.3* 107.0*  PLT 122* 121* 117* 127* 154   Cardiac Enzymes: No results for input(s): CKTOTAL, CKMB, CKMBINDEX, TROPONINI in the last 168 hours. BNP (last 3 results) No results for input(s): PROBNP in the last 8760 hours. CBG: Recent Labs  Lab 11/17/23 1613 11/17/23 2032 11/18/23 0903 11/18/23 1140 11/18/23 1654  GLUCAP 196* 244* 142* 133* 120*   D-Dimer: No results for input(s): DDIMER in the last 72 hours. Hgb A1c: No results for input(s): HGBA1C in the last 72 hours. Lipid Profile: No results for input(s): CHOL, HDL, LDLCALC, TRIG, CHOLHDL, LDLDIRECT in the last 72 hours. Thyroid  function studies: No results for input(s): TSH, T4TOTAL, T3FREE, THYROIDAB in the last 72 hours.  Invalid input(s): FREET3 Anemia work up: Recent Labs    11/17/23 1113  VITAMINB12 480  TIBC 245*  IRON  33   Sepsis Labs: Recent Labs  Lab 11/13/23 0438 11/14/23 0516 11/15/23 0517 11/18/23 1050  WBC 7.9 7.0 7.2 7.3    Microbiology No results found for this or any previous visit (from the past 240 hours).  Procedures and diagnostic studies:  No results found.     LOS: 5 days   Sayra Frisby Von  [facility]   Pager on [email or web address]. If 7PM-7AM, please contact night-coverage at [email or web address]     11/18/2023, 4:55 PM

## 2023-11-18 NOTE — Plan of Care (Signed)

## 2023-11-18 NOTE — Progress Notes (Signed)
 The viral testing was ordered at the request of social work and psychiatry to help with placement as it was required.

## 2023-11-19 LAB — MAGNESIUM: Magnesium: 2.5 mg/dL — ABNORMAL HIGH (ref 1.7–2.4)

## 2023-11-19 LAB — GLUCOSE, CAPILLARY
Glucose-Capillary: 123 mg/dL — ABNORMAL HIGH (ref 70–99)
Glucose-Capillary: 142 mg/dL — ABNORMAL HIGH (ref 70–99)
Glucose-Capillary: 177 mg/dL — ABNORMAL HIGH (ref 70–99)
Glucose-Capillary: 162 mg/dL — ABNORMAL HIGH (ref 70–99)

## 2023-11-19 LAB — CBC WITH DIFFERENTIAL/PLATELET
Abs Immature Granulocytes: 0.04 K/uL (ref 0.00–0.07)
Basophils Absolute: 0 K/uL (ref 0.0–0.1)
Basophils Relative: 1 %
Eosinophils Absolute: 0.2 K/uL (ref 0.0–0.5)
Eosinophils Relative: 3 %
HCT: 27.2 % — ABNORMAL LOW (ref 36.0–46.0)
Hemoglobin: 7.9 g/dL — ABNORMAL LOW (ref 12.0–15.0)
Immature Granulocytes: 1 %
Lymphocytes Relative: 10 %
Lymphs Abs: 0.6 K/uL — ABNORMAL LOW (ref 0.7–4.0)
MCH: 30.9 pg (ref 26.0–34.0)
MCHC: 29 g/dL — ABNORMAL LOW (ref 30.0–36.0)
MCV: 106.3 fL — ABNORMAL HIGH (ref 80.0–100.0)
Monocytes Absolute: 0.5 K/uL (ref 0.1–1.0)
Monocytes Relative: 8 %
Neutro Abs: 5.2 K/uL (ref 1.7–7.7)
Neutrophils Relative %: 77 %
Platelets: 125 K/uL — ABNORMAL LOW (ref 150–400)
RBC: 2.56 MIL/uL — ABNORMAL LOW (ref 3.87–5.11)
RDW: 15.9 % — ABNORMAL HIGH (ref 11.5–15.5)
WBC: 6.6 K/uL (ref 4.0–10.5)
nRBC: 0 % (ref 0.0–0.2)

## 2023-11-19 LAB — BASIC METABOLIC PANEL WITH GFR
Anion gap: 8 (ref 5–15)
BUN: 52 mg/dL — ABNORMAL HIGH (ref 8–23)
CO2: 34 mmol/L — ABNORMAL HIGH (ref 22–32)
Calcium: 9 mg/dL (ref 8.9–10.3)
Chloride: 99 mmol/L (ref 98–111)
Creatinine, Ser: 1.73 mg/dL — ABNORMAL HIGH (ref 0.44–1.00)
GFR, Estimated: 32 mL/min — ABNORMAL LOW (ref >60)
Glucose, Bld: 110 mg/dL — ABNORMAL HIGH (ref 70–99)
Potassium: 5.2 mmol/L — ABNORMAL HIGH (ref 3.5–5.1)
Sodium: 141 mmol/L (ref 135–145)

## 2023-11-19 LAB — PHOSPHORUS: Phosphorus: 3.4 mg/dL (ref 2.5–4.6)

## 2023-11-19 MED ORDER — SODIUM ZIRCONIUM CYCLOSILICATE 10 G PO PACK
10.0000 g | PACK | Freq: Three times a day (TID) | ORAL | Status: AC
  Administered 2023-11-19 (×2): 10 g via ORAL
  Filled 2023-11-19 (×2): qty 1

## 2023-11-19 MED ORDER — LEVOTHYROXINE SODIUM 100 MCG PO TABS
100.0000 ug | ORAL_TABLET | Freq: Every day | ORAL | Status: DC
  Administered 2023-11-20 – 2023-12-06 (×17): 100 ug via ORAL
  Filled 2023-11-19 (×17): qty 1

## 2023-11-19 NOTE — Progress Notes (Signed)
 Physical Therapy Treatment Patient Details Name: [name] MRN: [id number] DOB: [birth year] Today's Date: 11/19/2023   History of Present Illness [name] is a 67yoF 11/02/2023 after intentional ingestion of oxycodone  c SI. PMH: MDD, GAD,HTN, AF, DM2, DVT/PE status post IVC filter, HFpEF, single-chamber PPM and chronic pain. Pt recently started on home O2.    PT Comments  Pt received upright in bed agreeable to PT. Pt on 2 L/min at rest and throughout session. Maintains SPO2 > 90% at rest and post ambulation. Able to perform all activity today supervision to CGA. Ambulated 10' to <> from bathroom with voiding of urine. Dependent for pericare upon completion in standing. Able to brush teeth standing at sink with SUE support. Pt continues to need frequent seated rest breaks due to poor endurance being biggest barrier to progressing functional mobility needing x2 seated rest breaks b/t toilet and sitting EOB prior to ambulation to recliner. Continuing to educate on energy conservation and pacing to maximize standing and ambulation ADL's. Pt upright in recliner with all needs in reach. D/c recs remain appropriate.     If plan is discharge home, recommend the following: A little help with walking and/or transfers;A lot of help with bathing/dressing/bathroom;Assistance with cooking/housework;Assist for transportation;Help with stairs or ramp for entrance   Can travel by private vehicle        Equipment Recommendations  None recommended by PT    Recommendations for Other Services       Precautions / Restrictions Precautions Precautions: Fall Recall of Precautions/Restrictions: Intact Restrictions Weight Bearing Restrictions Per Provider Order: No     Mobility  Bed Mobility Overal bed mobility: Needs Assistance Bed Mobility: Supine to Sit     Supine to sit: Supervision, HOB elevated, Used rails     General bed mobility comments: increased time, bouts of momentum Patient  Response: Cooperative  Transfers Overall transfer level: Needs assistance Equipment used: Rolling walker (2 wheels) Transfers: Sit to/from Stand Sit to Stand: Supervision           General transfer comment: increased time and bouts of momentum    Ambulation/Gait Ambulation/Gait assistance: Supervision Gait Distance (Feet): 40 Feet (10' to bathroom. Seated rest. 10' from bathroom to EOB with seated rest. 20' around bed to recliner.) Assistive device: Rolling walker (2 wheels) Gait Pattern/deviations: Step-through pattern           Stairs             Wheelchair Mobility     Tilt Bed Tilt Bed Patient Response: Cooperative  Modified Rankin (Stroke Patients Only)       Balance Overall balance assessment: Needs assistance Sitting-balance support: Single extremity supported Sitting balance-[name]: Good     Standing balance support: During functional activity, Reliant on assistive device for balance Standing balance-[name]: Fair Standing balance comment: able to brush teeth at sink with SUE support on sink                            Communication    Cognition Arousal: Alert Behavior During Therapy: WFL for tasks assessed/performed, Flat affect   PT - Cognitive impairments: No apparent impairments                         Following commands: Intact      Cueing Cueing Techniques: Verbal cues  Exercises Other Exercises Other Exercises: pacing/energy conservation.    General Comments General  comments (skin integrity, edema, etc.): maintains 94% on 2 L/min via [place] at rest with post mobility.      Pertinent Vitals/Pain Pain Assessment Pain Assessment: No/denies pain    Home Living                          Prior Function            PT Goals (current goals can now be found in the care plan section) Acute Rehab PT Goals Patient Stated Goal: transition to LTC or progress function for safer independence PT  Goal Formulation: With patient Time For Goal Achievement: 11/27/23 Potential to Achieve Goals: Fair Progress towards PT goals: Progressing toward goals    Frequency    Min 2X/week      PT Plan      Co-evaluation              AM-PAC PT 6 Clicks Mobility   Outcome Measure  Help needed turning from your back to your side while in a flat bed without using bedrails?: A Lot Help needed moving from lying on your back to sitting on the side of a flat bed without using bedrails?: A Lot Help needed moving to and from a bed to a chair (including a wheelchair)?: A Little Help needed standing up from a chair using your arms (e.g., wheelchair or bedside chair)?: A Little Help needed to walk in hospital room?: A Little Help needed climbing 3-5 steps with a railing? : A Lot 6 Click Score: 15    End of Session Equipment Utilized During Treatment: Oxygen Activity Tolerance: Patient tolerated treatment well Patient left: in chair;with call bell/phone within reach Nurse Communication: Mobility status PT Visit Diagnosis: Difficulty in walking, not elsewhere classified (R26.2);Other abnormalities of gait and mobility (R26.89);Muscle weakness (generalized) (M62.81)     Time: 8576-8552 PT Time Calculation (min) (ACUTE ONLY): 24 min  Charges:    $Therapeutic Activity: 23-37 mins PT General Charges $$ ACUTE PT VISIT: 1 Visit                    Dorina HERO. Fairly IV, PT, DPT Physical Therapist- [place]  [facility] 11/19/2023, 3:09 PM

## 2023-11-19 NOTE — Progress Notes (Signed)
 Progress Note    [name]  FMW:[id number] DOB: 10-29-1955  DOA: 11/11/2023 PCP: Valma Carwin, MD      Brief Narrative:    Medical records reviewed and are as summarized below:  [name] is a 68 y.o. female with pulmonary hypertension, hypertension, atrial fibrillation on Eliquis , history of DVT/PE with IVC, heart failure preserved ejection fraction, morbid obesity, CKD stage IV, chronic respiratory failure requiring 2 L nasal cannula at baseline, anxiety, insulin -dependent diabetes mellitus, who presented to the emergency department on 11/02/2023 because of intentional overdose of OxyContin  on 11/01/2023.  She was admitted to the behavioral health unit on 11/06/2023 for further management. Hospitalist team was consulted on 11/21/2023 because of concerns of intractable nausea and vomiting.  She also complained of constipation for about 2 weeks.  Patient was also found to have weeping edema from the abdomen and weeping edema from left leg with bloody drainage soaking through left Unna boot.     Assessment/Plan:   Principal Problem:   Intractable nausea and vomiting Active Problems:   Polyuria   Essential hypertension   Paroxysmal atrial fibrillation (HCC)   Type 2 diabetes mellitus with stage 3b chronic kidney disease, with long-term current use of insulin  (HCC)   (HFpEF) heart failure with preserved ejection fraction (HCC)   Morbid obesity with BMI of 50.0-59.9, adult (HCC)   LBBB (left bundle branch block)   Hypothyroidism   Pulmonary HTN, Moderate  PA pressure 09/2009   Thrombocytopenia (HCC)   Drug overdose, intentional, initial encounter (HCC)   CKD (chronic kidney disease) stage 4, GFR 15-29 ml/min (HCC)    Intractable nausea and vomiting - resolved Suspect due to severe constipation, no BM in 2 weeks. --Bowel regimen - escalate as needed --IV antiemetics as needed --Tolerating PO intake, no need for IV fluids --Monitor BMP 8/13 started PPI 40 mg  IV twice daily followed by 40 mg p.o. daily for possible acid reflux/GERD   Polyuria -probably from diuresis UA with only trace leukocytes, rare bacteria, 0-5 WBC's -- not consistent with UTI. Urine culture from 05/05/2023: Showed Enterobacter cloacae, pansensitive Prior to admission, pt given Keflex  500 mg p.o. twice daily, note stated that patient received 3 days however patient only completed 5 doses total (2.5 days) of treatment 11/05/2023: First dose 2158; 2 doses were received on 11/06/2023; 2 doses were received on 11/07/2023 --Monitor urine output --Monitor fever curve and for dysuria    Acute on Chronic HFpEF, anasarca (ascites, peripheral edema)-  S/p IV Lasix . Held pm dose on 8/14   Monitor BMP, daily weight and urine output. Torsemide  on hold. Recent echo noted EF 60-65%, moderate LVH, normal RV -Recent R/LHC noted no significant CAD, PA pressure 62/16, wedge of 11, PVR 4.4, group 3 pulmonary hypertension suspected 8/14 creatinine 1.73 slightly elevated, held Lasix  for now.  Monitor renal function daily and diurese accordingly.     Chronic Lymphedema -- currently with increased, weeping edema Venous stasis ulcers of left lower extremity  Continue Unna boots on bilateral lower extremities. She had weeping edema (bloody drainage) from left leg.   Type 2 diabetes mellitus with long-term current use of insulin  (HCC) Continue Semglee  and NovoLog  as needed   Paroxysmal atrial fibrillation, bradycardia Continue Eliquis  HR in the 50s not on rate control drugs     # CKD (chronic kidney disease) stage 4, GFR 15-29 ml/min (HCC) Baseline creatinine around 1.6-2  8/14 sCr 1.73 --Monitor BMP with diuresis   # Hyperkalemia, Lokelma   given on 8/14 Check BMP daily   Drug overdose, intentional, initial encounter (HCC) Major depressive disorder, anxiety Continue Celexa  and Abilify  Pt was initially admitted to medical floor from geropsych unit. One-on-one suicide precautions were  lifted per psychiatry on 11/12/23. Pt can be discharged when medically stable, no longer requires psych admission.  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 daily from 8/15 Follow-up with PCP to repeat TSH level after 6 weeks.   Anemia of chronic disease Iron  profile, B12 within normal range Monitor H&H  Generalized weakness and fatigue, vitamin D  within normal range   Body mass index is 58.73 kg/m.  (Morbid obesity)  Diet Order             Diet Carb Modified Fluid consistency: Thin; Room service appropriate? Yes  Diet effective now                    Consultants: Psychiatrist Palliative care Vascular surgeon  Procedures: None    Medications:    allopurinol   50 mg Oral QODAY   amLODipine   10 mg Oral Daily   apixaban   5 mg Oral BID   ARIPiprazole   2 mg Oral QHS   citalopram   40 mg Oral QHS   feeding supplement  237 mL Oral BID BM   ferrous sulfate   325 mg Oral Daily   insulin  aspart  0-15 Units Subcutaneous TID WC   insulin  aspart  0-5 Units Subcutaneous QHS   insulin  glargine-yfgn  20 Units Subcutaneous Daily   levothyroxine   75 mcg Oral QAC breakfast   melatonin  10 mg Oral QHS   multivitamin with minerals  1 tablet Oral Daily   pantoprazole   40 mg Oral Daily   polyethylene glycol  17 g Oral Daily   rosuvastatin   10 mg Oral Daily   senna-docusate  1 tablet Oral BID   sodium zirconium cyclosilicate   10 g Oral TID   Continuous Infusions:   Anti-infectives (From admission, onward)    None        Family Communication/Anticipated D/C date and plan/Code Status   DVT prophylaxis: Place TED hose Start: 11/11/23 1406 apixaban  (ELIQUIS ) tablet 5 mg      Code Status: Limited: Do not attempt resuscitation (DNR) -DNR-LIMITED -Do Not Intubate/DNI   Family Communication: None Disposition Plan: Plan to discharge to SNF   Status is: Inpatient Remains inpatient appropriate because: Fluid overload       Subjective:   No significant events overnight.  In the morning time patient had nausea and several episodes of vomiting which resolved after antiemetic medications. During my exam patient was asymptomatic, denied any complaints. Management plan discussed with patient and she verbalized understanding.    Objective:    Vitals:   11/19/23 0023 11/19/23 0401 11/19/23 0524 11/19/23 0751  BP: (!) 147/62  (!) 148/42 (!) 143/53  Pulse: (!) 50  (!) 50 (!) 50  Resp:   18 16  Temp: 98.7 F (37.1 C)  98.6 F (37 C) 98.7 F (37.1 C)  TempSrc:   Oral Oral  SpO2: 100%  100% 100%  Weight:  (!) 141.3 kg    Height:  No data found.   Intake/Output Summary (Last 24 hours) at 11/19/2023 1509 Last data filed at 11/19/2023 1300 Gross per 24 hour  Intake 600 ml  Output --  Net 600 ml   Filed Weights   11/16/23 0455 11/18/23 0500 11/19/23 0401  Weight: (!) 141.8 kg (!) 141 kg (!) 141.3 kg    Exam:   GEN: NAD SKIN: Warm and dry EYES: No pallor or icterus ENT: MMM CV: RRR PULM: Equal air entry bilaterally, mild bilateral crackles, no wheezes ABD: soft, soft, distended with fluid, NT, +BS CNS: AAO x 3, non focal EXT: 2-3+ edema, Unna boots intact, most of the edema is in her thighs and abdomen/pannus  No tenderness,    Data Reviewed:   I have personally reviewed following labs and imaging studies:  Labs: Labs show the following:   Basic Metabolic Panel: Recent Labs  Lab 11/15/23 0517 11/16/23 0744 11/17/23 0534 11/17/23 1113 11/18/23 1050 11/19/23 0433  NA 139 142 142  --  141 141  K 4.5 4.8 4.6  --  4.8 5.2*  CL 99 100 97*  --  98 99  CO2 31 30 31   --  32 34*  GLUCOSE 115* 114* 110*  --  116* 110*  BUN  57* 56* 54*  --  50* 52*  CREATININE 1.69* 1.75* 1.69*  --  1.59* 1.73*  CALCIUM  8.9 9.2 9.1  --  9.5 9.0  MG  --  2.6*  --  2.5*  --  2.5*  PHOS 3.6  --   --  3.3  --  3.4   GFR Estimated Creatinine Clearance: 42.4 mL/min (A) (by C-G formula based on SCr of 1.73 mg/dL (H)). Liver Function Tests: Recent Labs  Lab 11/15/23 0517  ALBUMIN  3.3*   No results for input(s): LIPASE, AMYLASE in the last 168 hours. No results for input(s): AMMONIA in the last 168 hours. Coagulation profile No results for input(s): INR, PROTIME in the last 168 hours.  CBC: Recent Labs  Lab 11/13/23 0438 11/14/23 0516 11/15/23 0517 11/18/23 1050 11/19/23 0433  WBC 7.9 7.0 7.2 7.3 6.6  NEUTROABS  --   --   --   --  5.2  HGB 8.4* 8.0* 8.2* 9.0* 7.9*  HCT 28.1* 27.2* 27.0* 30.6* 27.2*  MCV 108.1* 107.1* 106.3* 107.0* 106.3*  PLT 121* 117* 127* 154 125*   Cardiac Enzymes: No results for input(s): CKTOTAL, CKMB, CKMBINDEX, TROPONINI in the last 168 hours. BNP (last 3 results) No results for input(s): PROBNP in the last 8760 hours. CBG: Recent Labs  Lab 11/18/23 1140 11/18/23 1654 11/18/23 2021 11/19/23 0752 11/19/23 1302  GLUCAP 133* 120* 211* 123* 142*   D-Dimer: No results for input(s): DDIMER in the last 72 hours. Hgb A1c: No results for input(s): HGBA1C in the last 72 hours. Lipid Profile: No results for input(s): CHOL, HDL, LDLCALC, TRIG, CHOLHDL, LDLDIRECT in the last 72 hours. Thyroid  function studies: No results for input(s): TSH, T4TOTAL, T3FREE, THYROIDAB in the last 72 hours.  Invalid input(s): FREET3 Anemia work up: Recent Labs    11/17/23 1113  VITAMINB12 480  TIBC 245*  IRON  33   Sepsis Labs: Recent Labs  Lab 11/14/23 0516 11/15/23 0517 11/18/23 1050 11/19/23 0433  WBC 7.0 7.2 7.3 6.6    Microbiology No results found for this or any previous visit (from the past 240 hours).  Procedures and diagnostic  studies:  No results found.     LOS: 6 days   Maudy Yonan  Juaquina Machnik  [email or web address]. If 7PM-7AM, please contact night-coverage at [email or web address]     11/19/2023, 3:09 PM

## 2023-11-20 ENCOUNTER — Inpatient Hospital Stay

## 2023-11-20 LAB — CBC
HCT: 27.4 % — ABNORMAL LOW (ref 36.0–46.0)
Hemoglobin: 8 g/dL — ABNORMAL LOW (ref 12.0–15.0)
MCH: 31.1 pg (ref 26.0–34.0)
MCHC: 29.2 g/dL — ABNORMAL LOW (ref 30.0–36.0)
MCV: 106.6 fL — ABNORMAL HIGH (ref 80.0–100.0)
Platelets: 135 K/uL — ABNORMAL LOW (ref 150–400)
RBC: 2.57 MIL/uL — ABNORMAL LOW (ref 3.87–5.11)
RDW: 15.7 % — ABNORMAL HIGH (ref 11.5–15.5)
WBC: 6.3 K/uL (ref 4.0–10.5)
nRBC: 0 % (ref 0.0–0.2)

## 2023-11-20 LAB — GLUCOSE, CAPILLARY
Glucose-Capillary: 139 mg/dL — ABNORMAL HIGH (ref 70–99)
Glucose-Capillary: 158 mg/dL — ABNORMAL HIGH (ref 70–99)
Glucose-Capillary: 152 mg/dL — ABNORMAL HIGH (ref 70–99)
Glucose-Capillary: 160 mg/dL — ABNORMAL HIGH (ref 70–99)

## 2023-11-20 LAB — BASIC METABOLIC PANEL WITH GFR
Anion gap: 10 (ref 5–15)
BUN: 55 mg/dL — ABNORMAL HIGH (ref 8–23)
CO2: 32 mmol/L (ref 22–32)
Calcium: 8.8 mg/dL — ABNORMAL LOW (ref 8.9–10.3)
Chloride: 100 mmol/L (ref 98–111)
Creatinine, Ser: 1.96 mg/dL — ABNORMAL HIGH (ref 0.44–1.00)
GFR, Estimated: 28 mL/min — ABNORMAL LOW (ref >60)
Glucose, Bld: 124 mg/dL — ABNORMAL HIGH (ref 70–99)
Potassium: 4.7 mmol/L (ref 3.5–5.1)
Sodium: 142 mmol/L (ref 135–145)

## 2023-11-20 LAB — PHOSPHORUS: Phosphorus: 3.7 mg/dL (ref 2.5–4.6)

## 2023-11-20 LAB — MAGNESIUM: Magnesium: 2.7 mg/dL — ABNORMAL HIGH (ref 1.7–2.4)

## 2023-11-20 NOTE — Progress Notes (Signed)
 PT Cancellation Note  Patient Details Name: [name] MRN: [id number] DOB: Apr 02, 1956   Cancelled Treatment:    Reason Eval/Treat Not Completed: Fatigue/lethargy limiting ability to participate Patient reports she has had a lot of tests today and she is exhausted. Declines PT today. Will continue to follow.    [name] 11/20/2023, 2:13 PM

## 2023-11-20 NOTE — Progress Notes (Signed)
 Progress Note    [name]  FMW:[id number] DOB: Oct 01, 1955  DOA: 11/11/2023 PCP: Valma Carwin, MD      Brief Narrative:    Medical records reviewed and are as summarized below:  [name] is a 68 y.o. female with pulmonary hypertension, hypertension, atrial fibrillation on Eliquis , history of DVT/PE with IVC, heart failure preserved ejection fraction, morbid obesity, CKD stage IV, chronic respiratory failure requiring 2 L nasal cannula at baseline, anxiety, insulin -dependent diabetes mellitus, who presented to the emergency department on 11/02/2023 because of intentional overdose of OxyContin  on 11/01/2023.  She was admitted to the behavioral health unit on 11/06/2023 for further management. Hospitalist team was consulted on 11/21/2023 because of concerns of intractable nausea and vomiting.  She also complained of constipation for about 2 weeks.  Patient was also found to have weeping edema from the abdomen and weeping edema from left leg with bloody drainage soaking through left Unna boot.     Assessment/Plan:   Principal Problem:   Intractable nausea and vomiting Active Problems:   Polyuria   Essential hypertension   Paroxysmal atrial fibrillation (HCC)   Type 2 diabetes mellitus with stage 3b chronic kidney disease, with long-term current use of insulin  (HCC)   (HFpEF) heart failure with preserved ejection fraction (HCC)   Morbid obesity with BMI of 50.0-59.9, adult (HCC)   LBBB (left bundle branch block)   Hypothyroidism   Pulmonary HTN, Moderate  PA pressure 09/2009   Thrombocytopenia (HCC)   Drug overdose, intentional, initial encounter (HCC)   CKD (chronic kidney disease) stage 4, GFR 15-29 ml/min (HCC)    Intractable nausea and vomiting - resolved Suspect due to severe constipation, no BM in 2 weeks. --Bowel regimen - escalate as needed --IV antiemetics as needed --Tolerating PO intake, no need for IV fluids --Monitor BMP 8/13 started PPI 40 mg  IV twice daily followed by 40 mg p.o. daily for possible acid reflux/GERD   Polyuria -probably from diuresis UA with only trace leukocytes, rare bacteria, 0-5 WBC's -- not consistent with UTI. Urine culture from 05/05/2023: Showed Enterobacter cloacae, pansensitive Prior to admission, pt given Keflex  500 mg p.o. twice daily, note stated that patient received 3 days however patient only completed 5 doses total (2.5 days) of treatment 11/05/2023: First dose 2158; 2 doses were received on 11/06/2023; 2 doses were received on 11/07/2023 --Monitor urine output --Monitor fever curve and for dysuria    Acute on Chronic HFpEF, anasarca (ascites, peripheral edema)-  S/p IV Lasix . Held pm dose on 8/14   Monitor BMP, daily weight and urine output. Torsemide  on hold. Recent echo noted EF 60-65%, moderate LVH, normal RV -Recent R/LHC noted no significant CAD, PA pressure 62/16, wedge of 11, PVR 4.4, group 3 pulmonary hypertension suspected 8/14 creatinine 1.73 slightly elevated, held Lasix  for now.  Monitor renal function daily and diurese accordingly. 8/15 sCr 1.96 is still elevated Bladder scan 73 mL   Chronic Lymphedema -- currently with increased, weeping edema Venous stasis ulcers of left lower extremity  Continue Unna boots on bilateral lower extremities. She had weeping edema (bloody drainage) from left leg.   Type 2 diabetes mellitus with long-term current use of insulin  (HCC) Continue Semglee  and NovoLog  as needed   Paroxysmal atrial fibrillation, bradycardia Continue Eliquis  HR in the 50s not on rate control drugs    # CKD (chronic kidney disease) stage 4, GFR 15-29 ml/min (HCC) Baseline creatinine around 1.6-2  8/14 sCr 1.73 --Monitor BMP  with diuresis   # Hyperkalemia, Lokelma  given on 8/14 Check BMP daily   Drug overdose, intentional, initial encounter (HCC) Major depressive disorder, anxiety Continue Celexa  and Abilify  Pt was initially admitted to medical floor from  geropsych unit. One-on-one suicide precautions were lifted per psychiatry on 11/12/23. Pt can be discharged when medically stable, no longer requires psych admission.  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 daily from 8/15 Follow-up with PCP to repeat TSH level after 6 weeks.   Anemia of chronic disease Iron  profile, B12 within normal range Monitor H&H  Generalized weakness and fatigue, vitamin D  within normal range   Body mass index is 58.73 kg/m.  (Morbid obesity)  Diet Order             Diet Carb Modified Fluid consistency: Thin; Room service appropriate? Yes  Diet effective now                    Consultants: Psychiatrist Palliative care Vascular surgeon  Procedures: None    Medications:    allopurinol   50 mg Oral QODAY   amLODipine   10 mg Oral Daily   apixaban   5 mg Oral BID   ARIPiprazole   2 mg Oral QHS   citalopram   40 mg Oral QHS   feeding supplement  237 mL Oral BID BM   ferrous sulfate   325 mg Oral Daily   insulin  aspart  0-15 Units Subcutaneous TID WC   insulin  aspart  0-5 Units Subcutaneous QHS   insulin  glargine-yfgn  20 Units Subcutaneous Daily   levothyroxine   100 mcg Oral QAC breakfast   melatonin  10 mg Oral QHS   multivitamin with minerals  1 tablet Oral Daily   pantoprazole   40 mg Oral Daily   polyethylene glycol  17 g Oral Daily   rosuvastatin   10 mg Oral Daily   senna-docusate  1 tablet Oral BID   Continuous Infusions:   Anti-infectives (From admission, onward)    None        Family Communication/Anticipated D/C date and plan/Code Status   DVT prophylaxis: Place TED hose Start: 11/11/23 1406 apixaban  (ELIQUIS ) tablet 5  mg     Code Status: Limited: Do not attempt resuscitation (DNR) -DNR-LIMITED -Do Not Intubate/DNI   Family Communication: None Disposition Plan: Plan to discharge to SNF   Status is: Inpatient Remains inpatient appropriate because: Fluid overload       Subjective:   No significant events overnight.  Patient was seen in the morning time after sonogram, patient is urinating well, denied any complaints.  Still has some weeping on the left upper thigh area but it is much better now.     Objective:    Vitals:   11/19/23 2028 11/20/23 0450 11/20/23 0500 11/20/23 0838  BP: (!) 135/46 (!) 142/50  (!) 142/48  Pulse: (!) 50   (!) 48  Resp: 15 16  18   Temp: 98.2 F (36.8 C) 98.5 F (36.9 C)  97.9 F (36.6 C)  TempSrc:  Oral    SpO2: 100% 100%  98%  Weight:   (!) 140.9 kg   Height:       No data found.  Intake/Output Summary (Last 24 hours) at 11/20/2023 1318 Last data filed at 11/20/2023 9071 Gross per 24 hour  Intake 360 ml  Output --  Net 360 ml   Filed Weights   11/18/23 0500 11/19/23 0401 11/20/23 0500  Weight: (!) 141 kg (!) 141.3 kg (!) 140.9 kg    Exam:   GEN: NAD SKIN: Warm and dry EYES: No pallor or icterus ENT: MMM CV: RRR PULM: Equal air entry bilaterally, mild bilateral crackles, no wheezes ABD: soft, soft, distended with fluid, NT, +BS CNS: AAO x 3, non focal EXT: 2-3+ edema, Unna boots intact, most of the edema is in her thighs and abdomen/pannus  No tenderness,    Data Reviewed:   I have personally reviewed following labs and imaging studies:  Labs: Labs show the following:   Basic Metabolic Panel: Recent Labs  Lab 11/15/23 0517 11/16/23 0744 11/17/23 0534 11/17/23 1113 11/18/23 1050 11/19/23 0433 11/20/23 0552  NA 139 142 142  --  141 141 142  K 4.5 4.8 4.6  --  4.8 5.2* 4.7  CL 99 100 97*  --  98 99 100  CO2 31 30 31   --  32 34* 32  GLUCOSE 115* 114* 110*  --  116* 110* 124*  BUN 57* 56* 54*  --  50* 52* 55*   CREATININE 1.69* 1.75* 1.69*  --  1.59* 1.73* 1.96*  CALCIUM  8.9 9.2 9.1  --  9.5 9.0 8.8*  MG  --  2.6*  --  2.5*  --  2.5* 2.7*  PHOS 3.6  --   --  3.3  --  3.4 3.7   GFR Estimated Creatinine Clearance: 37.4 mL/min (A) (by C-G formula based on SCr of 1.96 mg/dL (H)). Liver Function Tests: Recent Labs  Lab 11/15/23 0517  ALBUMIN  3.3*   No results for input(s): LIPASE, AMYLASE in the last 168 hours. No results for input(s): AMMONIA in the last 168 hours. Coagulation profile No results for input(s): INR, PROTIME in the last 168 hours.  CBC: Recent Labs  Lab 11/14/23 0516 11/15/23 0517 11/18/23 1050 11/19/23 0433 11/20/23 0552  WBC 7.0 7.2 7.3 6.6 6.3  NEUTROABS  --   --   --  5.2  --   HGB 8.0* 8.2* 9.0* 7.9* 8.0*  HCT 27.2* 27.0* 30.6* 27.2* 27.4*  MCV 107.1* 106.3* 107.0* 106.3* 106.6*  PLT 117* 127* 154 125* 135*   Cardiac Enzymes: No results for input(s): CKTOTAL, CKMB, CKMBINDEX, TROPONINI in the last 168 hours. BNP (last 3 results) No results for input(s): PROBNP in the last 8760 hours. CBG: Recent Labs  Lab 11/19/23 1302 11/19/23 1715 11/19/23 2035 11/20/23 0836 11/20/23 1304  GLUCAP 142* 177* 162* 139* 158*   D-Dimer: No results for input(s): DDIMER in the last 72 hours. Hgb A1c: No results for input(s): HGBA1C in the last 72 hours. Lipid Profile: No results for input(s): CHOL, HDL, LDLCALC, TRIG, CHOLHDL, LDLDIRECT in the last 72 hours. Thyroid  function studies: No results for input(s): TSH, T4TOTAL, T3FREE, THYROIDAB in the last 72 hours.  Invalid input(s): FREET3 Anemia work up: No results for input(s): VITAMINB12, FOLATE, FERRITIN, TIBC, IRON , RETICCTPCT in the last 72 hours.  Sepsis Labs: Recent Labs  Lab 11/15/23 0517 11/18/23 1050 11/19/23 0433 11/20/23 0552  WBC 7.2 7.3 6.6 6.3    Microbiology No results found for this or any previous visit (from the past 240  hours).  Procedures and diagnostic studies:  No results found.     LOS:  7 days   Viona Hosking Von  [facility] [employer]   Pager on [email or web address]. If 7PM-7AM, please contact night-coverage at [email or web address]     11/20/2023, 1:18 PM

## 2023-11-20 NOTE — Progress Notes (Signed)
 Shift report:  Patient had a couple episodes of emesis after taking morning medications and eating . She also had an ultrasound this shift. Patient was bladder scanned which resulted in 40ml's. Patient bedding was changed. Patient resting in room . No acute events. No concerns .

## 2023-11-20 NOTE — Plan of Care (Signed)

## 2023-11-20 NOTE — TOC Transition Note (Addendum)
 Transition of Care [facility]) - Discharge Note   Patient Details  Name: [name] MRN: [id number] Date of Birth: 11/24/1955  [facility] CM/SW Contact:  Dalia GORMAN Fuse, RN Phone Number: 11/20/2023, 1:21 PM   Clinical Narrative:     TOC expanded bed search to [place] in the hub. TOC spoke with Darryl  [phone] at [facility] in [place], [place]. They will take a look at the patient. TOC faxed FL2 and clinical to the facility at [phone]. TOC will continue to follow.        Patient Goals and CMS Choice            Discharge Placement                       Discharge Plan and Services Additional resources added to the After Visit Summary for                                       Social Drivers of Health (SDOH) Interventions SDOH Screenings   Food Insecurity: No Food Insecurity (11/11/2023)  Housing: Low Risk  (11/11/2023)  Recent Concern: Housing - High Risk (11/05/2023)  Transportation Needs: No Transportation Needs (11/11/2023)  Utilities: Not At Risk (11/11/2023)  Alcohol Screen: Low Risk  (11/05/2023)  Social Connections: Moderately Isolated (11/11/2023)  Tobacco Use: Medium Risk (11/11/2023)     Readmission Risk Interventions     No data to display

## 2023-11-21 LAB — MAGNESIUM: Magnesium: 2.7 mg/dL — ABNORMAL HIGH (ref 1.7–2.4)

## 2023-11-21 LAB — BASIC METABOLIC PANEL WITH GFR
Anion gap: 6 (ref 5–15)
BUN: 59 mg/dL — ABNORMAL HIGH (ref 8–23)
CO2: 32 mmol/L (ref 22–32)
Calcium: 8.8 mg/dL — ABNORMAL LOW (ref 8.9–10.3)
Chloride: 101 mmol/L (ref 98–111)
Creatinine, Ser: 2.29 mg/dL — ABNORMAL HIGH (ref 0.44–1.00)
GFR, Estimated: 23 mL/min — ABNORMAL LOW (ref >60)
Glucose, Bld: 157 mg/dL — ABNORMAL HIGH (ref 70–99)
Potassium: 4 mmol/L (ref 3.5–5.1)
Sodium: 139 mmol/L (ref 135–145)

## 2023-11-21 LAB — GLUCOSE, CAPILLARY
Glucose-Capillary: 130 mg/dL — ABNORMAL HIGH (ref 70–99)
Glucose-Capillary: 124 mg/dL — ABNORMAL HIGH (ref 70–99)
Glucose-Capillary: 133 mg/dL — ABNORMAL HIGH (ref 70–99)
Glucose-Capillary: 183 mg/dL — ABNORMAL HIGH (ref 70–99)
Glucose-Capillary: 190 mg/dL — ABNORMAL HIGH (ref 70–99)

## 2023-11-21 LAB — CBC
HCT: 26 % — ABNORMAL LOW (ref 36.0–46.0)
Hemoglobin: 7.9 g/dL — ABNORMAL LOW (ref 12.0–15.0)
MCH: 32.1 pg (ref 26.0–34.0)
MCHC: 30.4 g/dL (ref 30.0–36.0)
MCV: 105.7 fL — ABNORMAL HIGH (ref 80.0–100.0)
Platelets: 126 K/uL — ABNORMAL LOW (ref 150–400)
RBC: 2.46 MIL/uL — ABNORMAL LOW (ref 3.87–5.11)
RDW: 15.7 % — ABNORMAL HIGH (ref 11.5–15.5)
WBC: 6.5 K/uL (ref 4.0–10.5)
nRBC: 0 % (ref 0.0–0.2)

## 2023-11-21 LAB — PHOSPHORUS: Phosphorus: 3.7 mg/dL (ref 2.5–4.6)

## 2023-11-21 MED ORDER — FUROSEMIDE 10 MG/ML IJ SOLN
40.0000 mg | Freq: Two times a day (BID) | INTRAMUSCULAR | Status: DC
  Administered 2023-11-21 – 2023-11-25 (×8): 40 mg via INTRAVENOUS
  Filled 2023-11-21 (×8): qty 4

## 2023-11-21 MED ORDER — FUROSEMIDE 10 MG/ML IJ SOLN
40.0000 mg | Freq: Once | INTRAMUSCULAR | Status: AC
  Administered 2023-11-21: 40 mg via INTRAVENOUS
  Filled 2023-11-21: qty 4

## 2023-11-21 MED ORDER — METOLAZONE 5 MG PO TABS
5.0000 mg | ORAL_TABLET | Freq: Once | ORAL | Status: AC
  Administered 2023-11-21: 5 mg via ORAL
  Filled 2023-11-21: qty 1

## 2023-11-21 NOTE — Progress Notes (Signed)
 Progress Note    TECORA [name]  FMW:[id number] DOB: November 29, 1955  DOA: 11/11/2023 PCP: Valma Carwin, MD      Brief Narrative:    Medical records reviewed and are as summarized below:  [name] is a 68 y.o. female with pulmonary hypertension, hypertension, atrial fibrillation on Eliquis , history of DVT/PE with IVC, heart failure preserved ejection fraction, morbid obesity, CKD stage IV, chronic respiratory failure requiring 2 L nasal cannula at baseline, anxiety, insulin -dependent diabetes mellitus, who presented to the emergency department on 11/02/2023 because of intentional overdose of OxyContin  on 11/01/2023.  She was admitted to the behavioral health unit on 11/06/2023 for further management. Hospitalist team was consulted on 11/21/2023 because of concerns of intractable nausea and vomiting.  She also complained of constipation for about 2 weeks.  Patient was also found to have weeping edema from the abdomen and weeping edema from left leg with bloody drainage soaking through left Unna boot.     Assessment/Plan:   Principal Problem:   Intractable nausea and vomiting Active Problems:   Polyuria   Essential hypertension   Paroxysmal atrial fibrillation (HCC)   Type 2 diabetes mellitus with stage 3b chronic kidney disease, with long-term current use of insulin  (HCC)   (HFpEF) heart failure with preserved ejection fraction (HCC)   Morbid obesity with BMI of 50.0-59.9, adult (HCC)   LBBB (left bundle branch block)   Hypothyroidism   Pulmonary HTN, Moderate  PA pressure 09/2009   Thrombocytopenia (HCC)   Drug overdose, intentional, initial encounter (HCC)   CKD (chronic kidney disease) stage 4, GFR 15-29 ml/min (HCC)   # Intractable nausea and vomiting - resolved Suspect due to severe constipation, no BM in 2 weeks. --Bowel regimen - escalate as needed --IV antiemetics as needed --Tolerating PO intake, no need for IV fluids --Monitor BMP 8/13 started PPI 40 mg  IV twice daily followed by 40 mg p.o. daily for possible acid reflux/GERD   # Polyuria -probably from diuresis UA with only trace leukocytes, rare bacteria, 0-5 WBC's -- not consistent with UTI. Urine culture from 05/05/2023: Showed Enterobacter cloacae, pansensitive Prior to admission, pt given Keflex  500 mg p.o. twice daily, note stated that patient received 3 days however patient only completed 5 doses total (2.5 days) of treatment 11/05/2023: First dose 2158; 2 doses were received on 11/06/2023; 2 doses were received on 11/07/2023 --Monitor urine output --Monitor fever curve and for dysuria    # Acute on Chronic HFpEF, anasarca (ascites, peripheral edema)-  S/p IV Lasix . Held pm dose on 8/14   Monitor BMP, daily weight and urine output. Torsemide  on hold. Recent echo noted EF 60-65%, moderate LVH, normal RV -Recent R/LHC noted no significant CAD, PA pressure 62/16, wedge of 11, PVR 4.4, group 3 pulmonary hypertension suspected 8/14 creatinine 1.73 slightly elevated, held Lasix  for now.  Monitor renal function daily and diurese accordingly. Bladder scan 73 mL 8/16 sCr 1.96>>2.29  elevated Nephrology consulted, recommended to start Lasix  40 mg IV twice daily 8/16 metolazone  5 mg x 1 dose given with Lasix   # Chronic Lymphedema -- currently with increased, weeping edema # Venous stasis ulcers of left lower extremity  Continue Unna boots on bilateral lower extremities. She had weeping edema (bloody drainage) from left leg.   # Type 2 diabetes mellitus with long-term current use of insulin  (HCC) Continue Semglee  and NovoLog  as needed   # Paroxysmal atrial fibrillation, bradycardia Continue Eliquis  HR in the 50s not on rate control  drugs    # CKD (chronic kidney disease) stage 4, GFR 15-29 ml/min (HCC) Baseline creatinine around 1.6-2  8/14 sCr 1.73 --Monitor BMP with diuresis   # Hyperkalemia, Lokelma  given on 8/14 Check BMP daily   Drug overdose, intentional, initial  encounter (HCC) Major depressive disorder, anxiety Continue Celexa  and Abilify  Pt was initially admitted to medical floor from geropsych unit. One-on-one suicide precautions were lifted per psychiatry on 11/12/23. Pt can be discharged when medically stable, no longer requires psych admission.  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 daily from 8/15 Follow-up with PCP to repeat TSH level after 6 weeks.   Anemia of chronic disease Iron  profile, B12 within normal range Monitor H&H  Generalized weakness and fatigue, vitamin D  within normal range   Body mass index is 58.73 kg/m.  (Morbid obesity)  Diet Order             Diet Carb Modified Fluid consistency: Thin; Room service appropriate? Yes  Diet effective now                    Consultants: Psychiatrist Palliative care Vascular surgeon  Procedures: None    Medications:    allopurinol   50 mg Oral QODAY   amLODipine   10 mg Oral Daily   apixaban   5 mg Oral BID   ARIPiprazole   2 mg Oral QHS   citalopram   40 mg Oral QHS   feeding supplement  237 mL Oral BID BM   ferrous sulfate   325 mg Oral Daily   insulin  aspart  0-15 Units Subcutaneous TID WC   insulin  aspart  0-5 Units Subcutaneous QHS   insulin  glargine-yfgn  20 Units Subcutaneous Daily   levothyroxine   100 mcg Oral QAC breakfast   melatonin  10 mg Oral QHS   multivitamin with minerals  1 tablet Oral Daily   pantoprazole   40 mg Oral Daily   polyethylene glycol  17 g Oral Daily   rosuvastatin   10 mg Oral Daily   senna-docusate  1 tablet Oral BID   Continuous Infusions:   Anti-infectives (From admission, onward)    None        Family  Communication/Anticipated D/C date and plan/Code Status   DVT prophylaxis: Place TED hose Start: 11/11/23 1406 apixaban  (ELIQUIS ) tablet 5 mg     Code Status: Limited: Do not attempt resuscitation (DNR) -DNR-LIMITED -Do Not Intubate/DNI   Family Communication: None Disposition Plan: Plan to discharge to SNF   Status is: Inpatient Remains inpatient appropriate because: Fluid overload       Subjective:   No significant events overnight.  Asking for wound care for wound on her side.  Advised to ask RN to connect with the wound care RN for Patient is voiding well, denied any complaints Patient is aware that we are going to start her on Lasix  as per nephrology     Objective:    Vitals:   11/20/23 2050 11/21/23 0446 11/21/23 0500 11/21/23 0802  BP: (!) 146/57 (!) 144/49  (!) 144/46  Pulse: (!) 50 (!) 50  (!) 49  Resp: 15   20  Temp: 98.1 F (36.7 C) 98.2 F (36.8 C)  99 F (37.2 C)  TempSrc:      SpO2: 100% 100%  100%  Weight:   (!) 141 kg   Height:       No data found.   Intake/Output Summary (Last 24 hours) at 11/21/2023 1617 Last data filed at 11/21/2023 1300 Gross per 24 hour  Intake 720 ml  Output --  Net 720 ml   Filed Weights   11/19/23 0401 11/20/23 0500 11/21/23 0500  Weight: (!) 141.3 kg (!) 140.9 kg (!) 141 kg    Exam:   GEN: NAD SKIN: Warm and dry EYES: No pallor or icterus ENT: MMM CV: RRR PULM: Equal air entry bilaterally, mild bilateral crackles, no wheezes ABD: soft, soft, distended with fluid, NT, +BS CNS: AAO x 3, non focal EXT: 2-3+ edema, Unna boots intact, most of the edema is in her thighs and abdomen/pannus  No tenderness,    Data Reviewed:   I have personally reviewed following labs and imaging studies:  Labs: Labs show the following:   Basic Metabolic Panel: Recent Labs  Lab 11/15/23 0517 11/16/23 0744 11/17/23 0534 11/17/23 1113 11/18/23 1050 11/19/23 0433 11/20/23 0552 11/21/23 0530  NA 139 142 142  --   141 141 142 139  K 4.5 4.8 4.6  --  4.8 5.2* 4.7 4.0  CL 99 100 97*  --  98 99 100 101  CO2 31 30 31   --  32 34* 32 32  GLUCOSE 115* 114* 110*  --  116* 110* 124* 157*  BUN 57* 56* 54*  --  50* 52* 55* 59*  CREATININE 1.69* 1.75* 1.69*  --  1.59* 1.73* 1.96* 2.29*  CALCIUM  8.9 9.2 9.1  --  9.5 9.0 8.8* 8.8*  MG  --  2.6*  --  2.5*  --  2.5* 2.7* 2.7*  PHOS 3.6  --   --  3.3  --  3.4 3.7 3.7   GFR Estimated Creatinine Clearance: 32 mL/min (A) (by C-G formula based on SCr of 2.29 mg/dL (H)). Liver Function Tests: Recent Labs  Lab 11/15/23 0517  ALBUMIN  3.3*   No results for input(s): LIPASE, AMYLASE in the last 168 hours. No results for input(s): AMMONIA in the last 168 hours. Coagulation profile No results for input(s): INR, PROTIME in the last 168 hours.  CBC: Recent Labs  Lab 11/15/23 0517 11/18/23 1050 11/19/23 0433 11/20/23 0552 11/21/23 0530  WBC 7.2 7.3 6.6 6.3 6.5  NEUTROABS  --   --  5.2  --   --   HGB 8.2* 9.0* 7.9* 8.0* 7.9*  HCT 27.0* 30.6* 27.2* 27.4* 26.0*  MCV 106.3* 107.0* 106.3* 106.6* 105.7*  PLT 127* 154 125* 135* 126*   Cardiac Enzymes: No results for input(s): CKTOTAL, CKMB, CKMBINDEX, TROPONINI in the last 168 hours. BNP (last 3 results) No results for input(s): PROBNP in the last 8760 hours. CBG: Recent Labs  Lab 11/20/23 1619 11/20/23 2047 11/21/23 0259 11/21/23 0759 11/21/23 1135  GLUCAP 152* 160* 130* 124* 133*   D-Dimer: No results for input(s): DDIMER in the last 72 hours. Hgb A1c: No results for input(s): HGBA1C in the last 72 hours. Lipid Profile: No results for input(s): CHOL, HDL, LDLCALC, TRIG, CHOLHDL, LDLDIRECT in the last 72 hours. Thyroid  function studies: No results for input(s): TSH, T4TOTAL, T3FREE, THYROIDAB in the last 72 hours.  Invalid input(s): FREET3 Anemia work up: No results for input(s): VITAMINB12, FOLATE, FERRITIN, TIBC, IRON , RETICCTPCT in the  last 72 hours.  Sepsis Labs: Recent  Labs  Lab 11/18/23 1050 11/19/23 0433 11/20/23 0552 11/21/23 0530  WBC 7.3 6.6 6.3 6.5    Microbiology No results found for this or any previous visit (from the past 240 hours).  Procedures and diagnostic studies:  US  RENAL Result Date: 11/20/2023 CLINICAL DATA:  Acute kidney injury. EXAM: RENAL / URINARY TRACT ULTRASOUND COMPLETE COMPARISON:  03/11/2023 and 09/15/2022. Abdomen and pelvis CT dated 01/18/2013. FINDINGS: Right Kidney: Renal measurements: 9.5 x 5.8 x 4.7 cm = volume: 134 mL. Echogenicity within normal limits. 2.2 cm simple appearing cyst, not requiring imaging follow-up. No mass or hydronephrosis visualized. Left Kidney: Renal measurements: 10.0 x 5.0 x 4.5 cm = volume: 119 mL. Echogenicity within normal limits. Some portions of the kidney are not well visualized due to shadowing from overlying bowel loops. No mass or hydronephrosis visualized. Specifically, the previously described 2.2 cm midpole hypoechoic mass was not clearly visualized today. There is a suggestion of a corresponding area partially obscured by overlying bowel-gas on image number 27/32. There is also a similar-appearing area in the lower pole of the left kidney on the CT dated 01/18/2013. Based on the current and previous images, this most likely represents a minimally complicated cyst. Bladder: Appears normal for degree of bladder distention. Other: None. IMPRESSION: 1. No acute abnormality. 2. The previously described 2.2 cm midpole left renal mass was not clearly visualized today. There is a suggestion of a corresponding area partially obscured by overlying bowel-gas on image number 27/32. There is also a similar-appearing area in the lower pole of the left kidney on the CT dated 01/18/2013. Based on the previous images, this most likely represents a minimally complicated cyst. This could be further evaluated with pre and postcontrast abdomen CT or MRI when the patient's  condition permits. Electronically Signed   By: Elspeth Bathe M.D.   On: 11/20/2023 16:57       LOS: 8 days   Ivry Pigue Von  [facility]   Pager on [email or web address]. If 7PM-7AM, please contact night-coverage at [email or web address]     11/21/2023, 4:17 PM

## 2023-11-21 NOTE — Plan of Care (Signed)

## 2023-11-21 NOTE — Progress Notes (Signed)
 [facility] CONSULT NOTE    Date: 11/21/2023                  Patient Name:  [name]  MRN: [id number]  DOB: May 03, 1955  Age / Sex: 68 y.o., female         PCP: Valma Carwin, MD                 Service Requesting Consult: Medicine                  Reason for Consult: Acute kidney injury             History of Present Illness: Patient is a 68 y.o. female with a PMHx of hypertension, coronary artery disease, atrial fibrillation, congestive heart failure, DVT/PE, history of IVC placement, morbid obesity, diabetes, peripheral vascular disease, chronic kidney disease admitted with history of intentional overdose of OxyContin .  She has been on diuretics.  Now she is found to have worsening renal insufficiency and so renal evaluation is requested.  She has been on torsemide  at 40 mg twice a day.  She has been fluid in diet noncompliant.  Medications: Outpatient medications: Medications Prior to Admission  Medication Sig Dispense Refill Last Dose/Taking   allopurinol  (ZYLOPRIM ) 100 MG tablet Take 50 mg by mouth every other day.   Past Week   amLODipine  (NORVASC ) 5 MG tablet Take 2 tablets (10 mg total) by mouth daily.   Past Week   apixaban  (ELIQUIS ) 5 MG TABS tablet Take 5 mg by mouth 2 (two) times daily.   Past Week   Biotin  w/ Vitamins C & E (HAIR SKIN & NAILS GUMMIES PO) Take 1 each by mouth daily.   Past Week   citalopram  (CELEXA ) 40 MG tablet Take 40 mg by mouth at bedtime.   Past Week   diazepam  (VALIUM ) 5 MG tablet Take 1 tablet (5 mg total) by mouth 2 (two) times daily as needed for anxiety or muscle spasms. 10 tablet 0 Unknown   diphenhydramine -acetaminophen  (TYLENOL  PM) 25-500 MG TABS tablet Take 1 tablet by mouth at bedtime.   Past Week   EMBECTA PEN NEEDLE NANO 2 GEN 32G X 4 MM MISC    Taking   ferrous sulfate  325 (65 FE) MG EC tablet Take 325 mg by mouth daily.   Past Week   guaiFENesin -dextromethorphan  (ROBITUSSIN DM) 100-10 MG/5ML syrup Take by  mouth in the morning, at noon, and at bedtime.   Past Week   insulin  aspart (NOVOLOG ) 100 UNIT/ML injection Inject 6 Units into the skin 3 (three) times daily with meals.   Past Week   insulin  glargine (LANTUS  SOLOSTAR) 100 UNIT/ML Solostar Pen Inject 20 Units into the skin daily.   Past Week   levothyroxine  (SYNTHROID ) 75 MCG tablet Take 75 mcg by mouth daily before breakfast. PATIENT ONLY TAKES NAME BRAND   Past Week   Melatonin 10 MG TABS Take 10 mg by mouth.   Past Week   Multiple Vitamins-Minerals (CENTRUM SILVER) CHEW Chew 2 each by mouth daily.   Past Week   oxyCODONE  (OXY IR/ROXICODONE ) 5 MG immediate release tablet Take 5 mg by mouth every 6 (six) hours as needed for severe pain (pain score 7-10).   Past Week   rosuvastatin  (CRESTOR ) 10 MG tablet Take 1 tablet (10 mg total) by mouth daily. 30 tablet 0 Past Week   torsemide  (DEMADEX ) 20 MG tablet Take 2 tablets (40mg ) by mouth twice daily, in the morning  and at 4:00pm 180 tablet 0 Past Week    Discontinued Meds:   Medications Discontinued During This Encounter  Medication Reason   citalopram  (CELEXA ) tablet 40 mg    allopurinol  (ZYLOPRIM ) tablet 50 mg Duplicate   amLODipine  (NORVASC ) tablet 10 mg Duplicate   apixaban  (ELIQUIS ) tablet 5 mg Duplicate   levothyroxine  (SYNTHROID ) tablet 75 mcg    ferrous sulfate  tablet 325 mg    guaiFENesin -dextromethorphan  (ROBITUSSIN DM) 100-10 MG/5ML syrup 5 mL    insulin  aspart (novoLOG ) injection 0-5 Units    melatonin tablet 10 mg    torsemide  (DEMADEX ) tablet 40 mg    diazepam  (VALIUM ) tablet 5 mg    insulin  aspart (novoLOG ) injection 0-15 Units Duplicate   torsemide  (DEMADEX ) tablet 40 mg    apixaban  (ELIQUIS ) tablet 5 mg    albumin  human 25 % solution 12.5 g    apixaban  (ELIQUIS ) tablet 2.5 mg    furosemide  (LASIX ) injection 40 mg    levothyroxine  (SYNTHROID ) tablet 75 mcg     Current medications: Current Facility-Administered Medications  Medication Dose Route Frequency Provider Last  Rate Last Admin   allopurinol  (ZYLOPRIM ) tablet 50 mg  50 mg Oral QODAY Cox, Amy N, DO   50 mg at 11/20/23 0902   alum & mag hydroxide-simeth (MAALOX/MYLANTA) 200-200-20 MG/5ML suspension 30 mL  30 mL Oral Q4H PRN Jadapalle, Sree, MD       amLODipine  (NORVASC ) tablet 10 mg  10 mg Oral Daily Cox, Amy N, DO   10 mg at 11/21/23 9166   apixaban  (ELIQUIS ) tablet 5 mg  5 mg Oral BID Jens Durand, MD   5 mg at 11/21/23 9166   ARIPiprazole  (ABILIFY ) tablet 2 mg  2 mg Oral QHS Jadapalle, Sree, MD   2 mg at 11/20/23 2243   citalopram  (CELEXA ) tablet 40 mg  40 mg Oral QHS Jadapalle, Sree, MD   40 mg at 11/20/23 2128   diazepam  (VALIUM ) tablet 5 mg  5 mg Oral BID PRN Cox, Amy N, DO       feeding supplement (ENSURE PLUS HIGH PROTEIN) liquid 237 mL  237 mL Oral BID BM Fausto Sor A, DO   237 mL at 11/17/23 0930   ferrous sulfate  tablet 325 mg  325 mg Oral Daily Jadapalle, Sree, MD   325 mg at 11/21/23 9166   guaiFENesin -dextromethorphan  (ROBITUSSIN DM) 100-10 MG/5ML syrup 5 mL  5 mL Oral Q6H PRN Cox, Amy N, DO       insulin  aspart (novoLOG ) injection 0-15 Units  0-15 Units Subcutaneous TID WC Cox, Amy N, DO   2 Units at 11/21/23 1205   insulin  aspart (novoLOG ) injection 0-5 Units  0-5 Units Subcutaneous QHS Cox, Amy N, DO   2 Units at 11/18/23 2125   insulin  glargine-yfgn (SEMGLEE ) injection 20 Units  20 Units Subcutaneous Daily Cox, Amy N, DO   20 Units at 11/20/23 2128   levothyroxine  (SYNTHROID ) tablet 100 mcg  100 mcg Oral QAC breakfast Von Bellis, MD   100 mcg at 11/21/23 0513   melatonin tablet 10 mg  10 mg Oral QHS Cox, Amy N, DO   10 mg at 11/20/23 2127   multivitamin with minerals tablet 1 tablet  1 tablet Oral Daily Jadapalle, Sree, MD   1 tablet at 11/21/23 0833   neomycin -bacitracin -polymyxin [phone] OINT 1 Application  1 Application Apply externally BID PRN Jadapalle, Sree, MD       nystatin  (MYCOSTATIN /NYSTOP ) topical powder   Topical TID PRN Jadapalle, Sree, MD  ondansetron   (ZOFRAN ) injection 4 mg  4 mg Intravenous Q6H PRN Von Bellis, MD   4 mg at 11/20/23 9078   Oral care mouth rinse  15 mL Mouth Rinse PRN Cox, Amy N, DO       oxyCODONE  (Oxy IR/ROXICODONE ) immediate release tablet 5 mg  5 mg Oral Q6H PRN Jadapalle, Sree, MD       pantoprazole  (PROTONIX ) EC tablet 40 mg  40 mg Oral Daily Von Bellis, MD   40 mg at 11/21/23 9166   polyethylene glycol (MIRALAX  / GLYCOLAX ) packet 17 g  17 g Oral Daily Fausto Sor A, DO   17 g at 11/19/23 0820   rosuvastatin  (CRESTOR ) tablet 10 mg  10 mg Oral Daily Jadapalle, Sree, MD   10 mg at 11/21/23 9166   senna-docusate (Senokot-S) tablet 1 tablet  1 tablet Oral BID Fausto Sor A, DO   1 tablet at 11/21/23 9166      Allergies: Allergies  Allergen Reactions   Ms Contin [Morphine] Hives, Rash and Other (See Comments)    Broke out in brown spots all over.       Past Medical History: Past Medical History:  Diagnosis Date   A-fib (HCC)    Anemia    Anticoagulated on Coumadin , chronically 09/03/2011   Anxiety    Arthritis    right hip; both knees; left wrist/shoulder; back (01/19/2013   Bleeding on Coumadin  08/2012; 01/18/2013   [facility] admissions (01/19/2013)   CHF (congestive heart failure) (HCC)    2-3 times (01/19/2013)   Chronic lower back pain    Depression    DVT (deep venous thrombosis) (HCC) 10 years ago   numerous/notes 01/18/2013   GERD (gastroesophageal reflux disease)    Gout    Headache(784.0)    maybe weekly (01/19/2013)   Heart murmur    High cholesterol    been off RX for this at one time (01/18/2013)   History of blood transfusion 1983; 04/2012   3 w/ childbirth; hospitalized for pain (01/19/2013)   Hypertension    Hypothyroidism    Migraines    twice/yr maybe (01/19/2013)   Obstructive sleep apnea 05/03/2012   OSA (obstructive sleep apnea)    sent me for test in 04/2012; never ordered mask, etc (01/19/2013)   PE (pulmonary thromboembolism) (HCC) 3 years ag0    3/notes 01/18/2013   Pneumonia before 2011   once' (01/18/2013)   Renal disorder    kindey function low; Metformin was destroying my kidneys (01/19/2013)   Shortness of breath    only related to my CHF (01/18/2013)   Swelling of hand 08/31/2014   RT HAND   Type II diabetes mellitus (HCC)    UTI (urinary tract infection) 08/31/2014     Past Surgical History: Past Surgical History:  Procedure Laterality Date   CARDIAC CATHETERIZATION N/A 03/13/2015   Procedure: Right/Left Heart Cath and Coronary Angiography;  Surgeon: Gordy Bergamo, MD;  Location: [facility] INVASIVE CV LAB;  Service: Cardiovascular;  Laterality: N/A;   CATARACT EXTRACTION W/ INTRAOCULAR LENS  IMPLANT, BILATERAL Bilateral 2006-2011   CESAREAN SECTION  1983   CHOLECYSTECTOMY  ~ 2002   COLONOSCOPY N/A 01/21/2013   Procedure: COLONOSCOPY;  Surgeon: Belvie JONETTA Just, MD;  Location: [facility] ENDOSCOPY;  Service: Endoscopy;  Laterality: N/A;   EYE SURGERY Bilateral    multiple (01/18/2013)   PACEMAKER IMPLANT N/A 08/31/2018   Symptomatic bradycardia due to mobitz II second degree AV block, permanent afib/ atypical atrial flutter implanted by Dr Kelsie  PARS PLANA REPAIR OF RETINAL DEATACHMENT Right    PARS PLANA VITRECTOMY Bilateral 2004-2006   several (01/18/2013)   REFRACTIVE SURGERY Bilateral    for stigmatism (01/18/2013)   REFRACTIVE SURGERY Left ~ 11/2012   to puff it up cause vision got hazy (01/18/2013)   RIGHT HEART CATH N/A 12/30/2018   Procedure: RIGHT HEART CATH;  Surgeon: Cherrie Toribio SAUNDERS, MD;  Location: [facility] INVASIVE CV LAB;  Service: Cardiovascular;  Laterality: N/A;   RIGHT/LEFT HEART CATH AND CORONARY ANGIOGRAPHY N/A 09/07/2023   Procedure: RIGHT/LEFT HEART CATH AND CORONARY ANGIOGRAPHY;  Surgeon: Wendel Lurena POUR, MD;  Location: [facility] INVASIVE CV LAB;  Service: Cardiovascular;  Laterality: N/A;   VENA CAVA FILTER PLACEMENT  2011?     Family History: Family History  Problem Relation Age of Onset   Cerebral  aneurysm Mother    Hypertension Father    Cerebral aneurysm Maternal Grandfather    Cerebral aneurysm Maternal Aunt    Cancer Maternal Uncle      Social History: Social History   Socioeconomic History   Marital status: Single    Spouse name: Not on file   Number of children: Not on file   Years of education: Not on file   Highest education level: Not on file  Occupational History   Occupation: disabled  Tobacco Use   Smoking status: Former    Current packs/day: 0.00    Average packs/day: 0.1 packs/day for 30.0 years (1.5 ttl pk-yrs)    Types: Cigarettes    Start date: 65    Quit date: 42    Years since quitting: 35.6   Smokeless tobacco: Never   Tobacco comments:    01/19/2013 quit smoking cigarettes in the early '90's  Vaping Use   Vaping status: Never Used  Substance and Sexual Activity   Alcohol use: Not Currently    Comment: rare now but never a heavy drinker   Drug use: No   Sexual activity: Never  Other Topics Concern   Not on file  Social History Narrative   Not on file   Social Drivers of Health   Financial Resource Strain: Not on file  Food Insecurity: No Food Insecurity (11/11/2023)   Hunger Vital Sign    Worried About Running Out of Food in the Last Year: Never true    Ran Out of Food in the Last Year: Never true  Transportation Needs: No Transportation Needs (11/11/2023)   PRAPARE - Administrator, Civil Service (Medical): No    Lack of Transportation (Non-Medical): No  Physical Activity: Not on file  Stress: Not on file  Social Connections: Moderately Isolated (11/11/2023)   Social Connection and Isolation Panel    Frequency of Communication with Friends and Family: More than three times a week    Frequency of Social Gatherings with Friends and Family: Never    Attends Religious Services: More than 4 times per year    Active Member of [employer] or Organizations: No    Attends Banker Meetings: Never    Marital Status:  Divorced  Catering manager Violence: Not At Risk (11/11/2023)   Humiliation, Afraid, Rape, and Kick questionnaire    Fear of Current or Ex-Partner: No    Emotionally Abused: No    Physically Abused: No    Sexually Abused: No     Review of Systems: As per HPI  Vital Signs: Blood pressure (!) 144/46, pulse (!) 49, temperature 99 F (37.2 C), resp. rate 20, height  5' 1 (1.549 m), weight (!) 141 kg, SpO2 100%.  Weight trends: Filed Weights   11/19/23 0401 11/20/23 0500 11/21/23 0500  Weight: (!) 141.3 kg (!) 140.9 kg (!) 141 kg    Physical Exam: Physical Exam: General:  No acute distress  Head:  Normocephalic, atraumatic. Moist oral mucosal membranes  Eyes:  Anicteric  Neck:  Supple  Lungs:   Clear to auscultation, normal effort  Heart:  S1S2 no rubs  Abdomen:   Soft, nontender, bowel sounds present  Extremities:  Two plus peripheral edema.  Neurologic:  Awake, alert, following commands  Skin:  No lesions  Access:     Lab results:  Basic Metabolic Panel: Recent Labs  Lab 11/15/23 0517 11/16/23 0744 11/17/23 0534 11/17/23 1113 11/18/23 1050 11/19/23 0433 11/20/23 0552 11/21/23 0530  NA 139 142 142  --  141 141 142 139  K 4.5 4.8 4.6  --  4.8 5.2* 4.7 4.0  CL 99 100 97*  --  98 99 100 101  CO2 31 30 31   --  32 34* 32 32  GLUCOSE 115* 114* 110*  --  116* 110* 124* 157*  BUN 57* 56* 54*  --  50* 52* 55* 59*  CREATININE 1.69* 1.75* 1.69*  --  1.59* 1.73* 1.96* 2.29*  CALCIUM  8.9 9.2 9.1  --  9.5 9.0 8.8* 8.8*  MG  --  2.6*  --  2.5*  --  2.5* 2.7* 2.7*  PHOS 3.6  --   --  3.3  --  3.4 3.7 3.7    Creatinine  Date/Time Value Ref Range Status  11/16/2014 11:40 AM 1.3 (H) 0.6 - 1.1 mg/dL Final  94/93/7984 98:43 PM 1.5 (H) 0.6 - 1.1 mg/dL Final   Creatinine, Ser  Date/Time Value Ref Range Status  11/21/2023 05:30 AM 2.29 (H) 0.44 - 1.00 mg/dL Final  91/84/7974 94:47 AM 1.96 (H) 0.44 - 1.00 mg/dL Final  91/85/7974 95:66 AM 1.73 (H) 0.44 - 1.00 mg/dL Final   91/86/7974 89:49 AM 1.59 (H) 0.44 - 1.00 mg/dL Final  91/87/7974 94:65 AM 1.69 (H) 0.44 - 1.00 mg/dL Final  91/88/7974 92:55 AM 1.75 (H) 0.44 - 1.00 mg/dL Final  91/89/7974 94:82 AM 1.69 (H) 0.44 - 1.00 mg/dL Final  91/90/7974 94:83 AM 1.90 (H) 0.44 - 1.00 mg/dL Final  91/92/7974 95:94 AM 1.50 (H) 0.44 - 1.00 mg/dL Final  91/93/7974 96:96 PM 1.48 (H) 0.44 - 1.00 mg/dL Final  91/93/7974 97:85 PM 1.46 (H) 0.44 - 1.00 mg/dL Final  92/71/7974 87:69 PM 2.28 (H) 0.44 - 1.00 mg/dL Final  92/78/7974 87:99 PM 1.96 (H) 0.44 - 1.00 mg/dL Final  92/98/7974 88:87 AM 2.17 (H) 0.44 - 1.00 mg/dL Final  93/70/7974 94:88 AM 2.32 (H) 0.44 - 1.00 mg/dL Final  93/75/7974 89:89 AM 1.97 (H) 0.44 - 1.00 mg/dL Final  93/86/7974 95:93 PM 2.17 (H) 0.57 - 1.00 mg/dL Final  93/88/7974 97:65 AM 2.16 (H) 0.44 - 1.00 mg/dL Final  93/89/7974 97:50 AM 2.26 (H) 0.44 - 1.00 mg/dL Final  93/90/7974 97:45 AM 2.45 (H) 0.44 - 1.00 mg/dL Final  93/91/7974 96:96 AM 2.52 (H) 0.44 - 1.00 mg/dL Final  93/92/7974 94:47 AM 2.45 (H) 0.44 - 1.00 mg/dL Final  93/93/7974 87:80 PM 2.38 (H) 0.44 - 1.00 mg/dL Final  93/96/7974 94:76 AM 2.20 (H) 0.44 - 1.00 mg/dL Final  93/97/7974 94:77 AM 1.92 (H) 0.44 - 1.00 mg/dL Final  93/98/7974 95:59 AM 2.13 (H) 0.44 - 1.00 mg/dL Final  94/68/7974 95:60 AM 1.91 (  H) 0.44 - 1.00 mg/dL Final  94/69/7974 95:93 AM 1.99 (H) 0.44 - 1.00 mg/dL Final  94/70/7974 90:50 AM 1.69 (H) 0.44 - 1.00 mg/dL Final  90/81/7976 89:44 PM 1.69 (H) 0.44 - 1.00 mg/dL Final  91/92/7976 96:92 PM 1.83 (H) 0.44 - 1.00 mg/dL Final  92/78/7976 87:89 PM 1.90 (H) 0.44 - 1.00 mg/dL Final  93/91/7976 96:48 AM 1.55 (H) 0.44 - 1.00 mg/dL Final  93/92/7976 95:49 AM 1.43 (H) 0.44 - 1.00 mg/dL Final  93/93/7976 95:47 AM 1.26 (H) 0.44 - 1.00 mg/dL Final  93/94/7976 95:64 AM 1.26 (H) 0.44 - 1.00 mg/dL Final  93/95/7976 92:55 PM 1.33 (H) 0.44 - 1.00 mg/dL Final  93/69/7978 87:47 PM 1.55 (H) 0.44 - 1.00 mg/dL Final  98/74/7978 90:75 AM  1.81 (H) 0.44 - 1.00 mg/dL Final  88/96/7979 97:55 PM 1.54 (H) 0.44 - 1.00 mg/dL Final  89/85/7979 87:70 PM 1.73 (H) 0.44 - 1.00 mg/dL Final  90/74/7979 96:68 AM 1.86 (H) 0.44 - 1.00 mg/dL Final  90/75/7979 95:81 AM 2.25 (H) 0.44 - 1.00 mg/dL Final  90/76/7979 95:70 AM 2.34 (H) 0.44 - 1.00 mg/dL Final  90/77/7979 95:52 AM 2.55 (H) 0.44 - 1.00 mg/dL Final  90/78/7979 94:60 AM 2.62 (H) 0.44 - 1.00 mg/dL Final  90/79/7979 87:87 PM 2.23 (H) 0.44 - 1.00 mg/dL Final  94/70/7979 95:55 AM 1.64 (H) 0.44 - 1.00 mg/dL Final  94/72/7979 96:55 AM 1.87 (H) 0.44 - 1.00 mg/dL Final  94/74/7979 96:80 AM 2.23 (H) 0.44 - 1.00 mg/dL Final  94/75/7979 87:51 PM 2.24 (H) 0.44 - 1.00 mg/dL Final  96/85/7979 91:70 PM 1.42 (H) 0.44 - 1.00 mg/dL Final    CBC: Recent Labs  Lab 11/15/23 0517 11/18/23 1050 11/19/23 0433 11/20/23 0552 11/21/23 0530  WBC 7.2 7.3 6.6 6.3 6.5  NEUTROABS  --   --  5.2  --   --   HGB 8.2* 9.0* 7.9* 8.0* 7.9*  HCT 27.0* 30.6* 27.2* 27.4* 26.0*  MCV 106.3* 107.0* 106.3* 106.6* 105.7*  PLT 127* 154 125* 135* 126*    Microbiology: Results for orders placed or performed during the hospital encounter of 11/02/23  Urine Culture     Status: Abnormal   Collection Time: 11/02/23  1:41 PM   Specimen: Urine, Clean Catch  Result Value Ref Range Status   Specimen Description URINE, CLEAN CATCH  Final   Special Requests   Final    NONE Performed at [facility], [address] [place], [place] [address]    Culture >=100,000 COLONIES/mL ENTEROBACTER CLOACAE (A)  Final   Report Status 11/05/2023 FINAL  Final   Organism ID, Bacteria ENTEROBACTER CLOACAE (A)  Final      Susceptibility   Enterobacter cloacae - MIC*    CEFEPIME <=0.12 SENSITIVE Sensitive     CIPROFLOXACIN  <=0.25 SENSITIVE Sensitive     GENTAMICIN  <=1 SENSITIVE Sensitive     IMIPENEM <=0.25 SENSITIVE Sensitive     NITROFURANTOIN <=16 SENSITIVE Sensitive     TRIMETH/SULFA <=20 SENSITIVE Sensitive     PIP/TAZO <=4  SENSITIVE Sensitive ug/mL    * >=100,000 COLONIES/mL ENTEROBACTER CLOACAE  Resp panel by RT-PCR (RSV, Flu A&B, Covid) Anterior Nasal Swab     Status: None   Collection Time: 11/05/23 10:48 AM   Specimen: Anterior Nasal Swab  Result Value Ref Range Status   SARS Coronavirus 2 by RT PCR NEGATIVE NEGATIVE Final   Influenza A by PCR NEGATIVE NEGATIVE Final   Influenza B by PCR NEGATIVE NEGATIVE Final  Comment: (NOTE) The Xpert Xpress SARS-CoV-2/FLU/RSV plus assay is intended as an aid in the diagnosis of influenza from Nasopharyngeal swab specimens and should not be used as a sole basis for treatment. Nasal washings and aspirates are unacceptable for Xpert Xpress SARS-CoV-2/FLU/RSV testing.  Fact Sheet for Patients: BloggerCourse.com  Fact Sheet for Healthcare Providers: SeriousBroker.it  This test is not yet approved or cleared by the [place]  FDA and has been authorized for detection and/or diagnosis of SARS-CoV-2 by FDA under an Emergency Use Authorization (EUA). This EUA will remain in effect (meaning this test can be used) for the duration of the COVID-19 declaration under Section 564(b)(1) of the Act, 21 U.S.C. section 360bbb-3(b)(1), unless the authorization is terminated or revoked.     Resp Syncytial Virus by PCR NEGATIVE NEGATIVE Final    Comment: (NOTE) Fact Sheet for Patients: BloggerCourse.com  Fact Sheet for Healthcare Providers: SeriousBroker.it  This test is not yet approved or cleared by the [place]  FDA and has been authorized for detection and/or diagnosis of SARS-CoV-2 by FDA under an Emergency Use Authorization (EUA). This EUA will remain in effect (meaning this test can be used) for the duration of the COVID-19 declaration under Section 564(b)(1) of the Act, 21 U.S.C. section 360bbb-3(b)(1), unless the authorization is terminated  or revoked.  Performed at [facility], [address], [place], [place] [address]     Urinalysis: No results for input(s): COLORURINE, LABSPEC, PHURINE, GLUCOSEU, HGBUR, BILIRUBINUR, KETONESUR, PROTEINUR, UROBILINOGEN, NITRITE, LEUKOCYTESUR in the last 72 hours.  Invalid input(s): APPERANCEUR   Imaging:  US  RENAL Result Date: 11/20/2023 CLINICAL DATA:  Acute kidney injury. EXAM: RENAL / URINARY TRACT ULTRASOUND COMPLETE COMPARISON:  03/11/2023 and 09/15/2022. Abdomen and pelvis CT dated 01/18/2013. FINDINGS: Right Kidney: Renal measurements: 9.5 x 5.8 x 4.7 cm = volume: 134 mL. Echogenicity within normal limits. 2.2 cm simple appearing cyst, not requiring imaging follow-up. No mass or hydronephrosis visualized. Left Kidney: Renal measurements: 10.0 x 5.0 x 4.5 cm = volume: 119 mL. Echogenicity within normal limits. Some portions of the kidney are not well visualized due to shadowing from overlying bowel loops. No mass or hydronephrosis visualized. Specifically, the previously described 2.2 cm midpole hypoechoic mass was not clearly visualized today. There is a suggestion of a corresponding area partially obscured by overlying bowel-gas on image number 27/32. There is also a similar-appearing area in the lower pole of the left kidney on the CT dated 01/18/2013. Based on the current and previous images, this most likely represents a minimally complicated cyst. Bladder: Appears normal for degree of bladder distention. Other: None. IMPRESSION: 1. No acute abnormality. 2. The previously described 2.2 cm midpole left renal mass was not clearly visualized today. There is a suggestion of a corresponding area partially obscured by overlying bowel-gas on image number 27/32. There is also a similar-appearing area in the lower pole of the left kidney on the CT dated 01/18/2013. Based on the previous images, this most likely represents a minimally complicated cyst. This could be  further evaluated with pre and postcontrast abdomen CT or MRI when the patient's condition permits. Electronically Signed   By: Elspeth Bathe M.D.   On: 11/20/2023 16:57     Assessment & Plan:  68 y.o. female with a PMHx of hypertension, coronary artery disease, atrial fibrillation, congestive heart failure, DVT/PE, history of IVC placement, morbid obesity, diabetes, peripheral vascular disease, chronic kidney disease admitted with history of intentional overdose of OxyContin .  She has been on diuretics.  Now she is found to have worsening renal insufficiency and so renal evaluation is requested.  She has been on torsemide  at 40 mg twice a day.  She has been fluid in diet noncompliant   Principal Problem:   Intractable nausea and vomiting Active Problems:   Morbid obesity with BMI of 50.0-59.9, adult (HCC)   Essential hypertension   Pulmonary HTN, Moderate  PA pressure 09/2009   LBBB (left bundle branch block)   Thrombocytopenia (HCC)   Paroxysmal atrial fibrillation (HCC)   Type 2 diabetes mellitus with stage 3b chronic kidney disease, with long-term current use of insulin  (HCC)   Hypothyroidism   (HFpEF) heart failure with preserved ejection fraction (HCC)   Drug overdose, intentional, initial encounter (HCC)   Polyuria   CKD (chronic kidney disease) stage 4, GFR 15-29 ml/min (HCC)   #1: Chronic kidney disease: Patient has chronic kidney disease most likely secondary to diabetic kidney disease complicated by decreased cardiac output during the ischemic renal disease.  She had a renal sonogram which did not show any evidence of obstruction.  She has a 2.2 cm renal mass and is being evaluated by urology.  #2: CHF/hypertension: Continue amlodipine  at 10 mg daily.  We need to resume diuresis with intravenous Lasix  40 mg twice a day for obligated diuresis.  She is advised on the importance of 2 g salt restricted diet along with fluid restriction.  #3: Diabetes: Continue insulin  as  ordered.  Diet advised.  #4: Renal mass: Patient is scheduled for CT scan and is scheduled to follow-up with urology.  #5: Anemia: Anemia is most likely secondary to chronic kidney disease.  Continue iron  supplementation.  Will continue to follow with you. Labs and medications reviewed.  LOS: 8 Pinkey Edman, MD [facility]. 8/16/20251:14 PM

## 2023-11-21 NOTE — Consult Note (Addendum)
 WOC Nurse Consult Note: WOC team consulted for lymphedema and weeping abdominal folds, patient is followed by [facility] team for unna boot L leg with plans to be seen Tuesday (see prior note 11/17/2023)  Reason for Consult:weeping abdominal folds  Wound type: 1.  Full thickness abdominal fold r/t moisture and friction  2. Intertriginous dermatitis under pannus/abdominal folds  Pressure Injury POA:NA  Measurement: widespread under abdominal pannus  Wound bed: erythema with scattered partial thickness skin loss  Drainage (amount, consistency, odor) see nursing flowsheet  Periwound: Dressing procedure/placement/frequency: Cleanse underneath abdominal pannus with soap and water, dry well.  May sprinkle with floor stock antifungal powder (Microguard green and white label) then apply Order Gerlean # [phone] Measure and cut length of InterDry to fit in skin folds that have skin breakdown Tuck InterDry fabric into skin folds in a single layer, allow for 2 inches of overhang from skin edges to allow for wicking to occur May remove to bathe; dry area thoroughly and then tuck into affected areas again Do not apply any creams or ointments when using InterDry DO NOT THROW AWAY FOR 5 DAYS unless soiled with stool DO NOT [facility] product, this will inactivate the silver in the material  New sheet of Interdry should be applied after 5 days of use if patient continues to have skin breakdown 2.  Cleanse full thickness wound to abdomina fold with NS, cut a piece of silver hydrofiber (Aquacel AG Gerlean [id number]) to fit wound bed daily and secure with ABD pad and tape.  If silver hydrofiber does not handle drainage try Drawtex superabsorbent dressing Gerlean [phone].      POC discussed with bedside nurse. Appreciate DELENA Jaffe, RN assistance with this consult.  WOC team will follow for lymphedema/unna boots.   Thank you,    Powell Bar MSN, RN-BC, [employer]

## 2023-11-22 LAB — GLUCOSE, CAPILLARY
Glucose-Capillary: 117 mg/dL — ABNORMAL HIGH (ref 70–99)
Glucose-Capillary: 162 mg/dL — ABNORMAL HIGH (ref 70–99)
Glucose-Capillary: 154 mg/dL — ABNORMAL HIGH (ref 70–99)
Glucose-Capillary: 194 mg/dL — ABNORMAL HIGH (ref 70–99)

## 2023-11-22 LAB — BASIC METABOLIC PANEL WITH GFR
Anion gap: 9 (ref 5–15)
BUN: 62 mg/dL — ABNORMAL HIGH (ref 8–23)
CO2: 32 mmol/L (ref 22–32)
Calcium: 8.7 mg/dL — ABNORMAL LOW (ref 8.9–10.3)
Chloride: 98 mmol/L (ref 98–111)
Creatinine, Ser: 2.21 mg/dL — ABNORMAL HIGH (ref 0.44–1.00)
GFR, Estimated: 24 mL/min — ABNORMAL LOW (ref >60)
Glucose, Bld: 122 mg/dL — ABNORMAL HIGH (ref 70–99)
Potassium: 3.8 mmol/L (ref 3.5–5.1)
Sodium: 139 mmol/L (ref 135–145)

## 2023-11-22 LAB — CBC
HCT: 25.8 % — ABNORMAL LOW (ref 36.0–46.0)
Hemoglobin: 7.8 g/dL — ABNORMAL LOW (ref 12.0–15.0)
MCH: 31.5 pg (ref 26.0–34.0)
MCHC: 30.2 g/dL (ref 30.0–36.0)
MCV: 104 fL — ABNORMAL HIGH (ref 80.0–100.0)
Platelets: 124 K/uL — ABNORMAL LOW (ref 150–400)
RBC: 2.48 MIL/uL — ABNORMAL LOW (ref 3.87–5.11)
RDW: 15.6 % — ABNORMAL HIGH (ref 11.5–15.5)
WBC: 6.5 K/uL (ref 4.0–10.5)
nRBC: 0 % (ref 0.0–0.2)

## 2023-11-22 NOTE — Plan of Care (Signed)
  Problem: Education: Goal: Knowledge of General Education information will improve Description: Including pain rating scale, medication(s)/side effects and non-pharmacologic comfort measures Outcome: Progressing   Problem: Health Behavior/Discharge Planning: Goal: Ability to manage health-related needs will improve Outcome: Progressing   Problem: Clinical Measurements: Goal: Ability to maintain clinical measurements within normal limits will improve Outcome: Progressing Goal: Will remain free from infection Outcome: Progressing Goal: Diagnostic test results will improve Outcome: Progressing Goal: Respiratory complications will improve Outcome: Progressing Goal: Cardiovascular complication will be avoided Outcome: Progressing   Problem: Activity: Goal: Risk for activity intolerance will decrease Outcome: Progressing   Problem: Nutrition: Goal: Adequate nutrition will be maintained Outcome: Progressing   Problem: Coping: Goal: Level of anxiety will decrease Outcome: Progressing   Problem: Elimination: Goal: Will not experience complications related to bowel motility Outcome: Progressing Goal: Will not experience complications related to urinary retention Outcome: Progressing   Problem: Pain Managment: Goal: General experience of comfort will improve and/or be controlled Outcome: Progressing   Problem: Safety: Goal: Ability to remain free from injury will improve Outcome: Progressing   Problem: Skin Integrity: Goal: Risk for impaired skin integrity will decrease Outcome: Progressing   Problem: Education: Goal: Ability to describe self-care measures that may prevent or decrease complications (Diabetes Survival Skills Education) will improve Outcome: Progressing Goal: Individualized Educational Video(s) Outcome: Progressing   Problem: Coping: Goal: Ability to adjust to condition or change in health will improve Outcome: Progressing

## 2023-11-22 NOTE — Plan of Care (Signed)

## 2023-11-22 NOTE — Progress Notes (Signed)
 [facility]  PROGRESS NOTE   Subjective:   Patient seen at bedside.  Breathing much better.  Has been urinating a lot with the IV Lasix .  Objective:  Vital signs: Blood pressure (!) 151/55, pulse (!) 50, temperature 98.1 F (36.7 C), resp. rate 18, height 5' 1 (1.549 m), weight (!) 140.2 kg, SpO2 100%.  Intake/Output Summary (Last 24 hours) at 11/22/2023 1222 Last data filed at 11/22/2023 0900 Gross per 24 hour  Intake 1740 ml  Output --  Net 1740 ml   Filed Weights   11/20/23 0500 11/21/23 0500 11/22/23 0406  Weight: (!) 140.9 kg (!) 141 kg (!) 140.2 kg     Physical Exam: General:  No acute distress  Head:  Normocephalic, atraumatic. Moist oral mucosal membranes  Eyes:  Anicteric  Neck:  Supple  Lungs:   Clear to auscultation, normal effort  Heart:  S1S2 no rubs  Abdomen:   Soft, nontender, bowel sounds present  Extremities: 1+ peripheral edema.  Neurologic:  Awake, alert, following commands  Skin:  No lesions  Access:     Basic Metabolic Panel: Recent Labs  Lab 11/16/23 0744 11/17/23 0534 11/17/23 1113 11/18/23 1050 11/19/23 0433 11/20/23 0552 11/21/23 0530 11/22/23 0609  NA 142   < >  --  141 141 142 139 139  K 4.8   < >  --  4.8 5.2* 4.7 4.0 3.8  CL 100   < >  --  98 99 100 101 98  CO2 30   < >  --  32 34* 32 32 32  GLUCOSE 114*   < >  --  116* 110* 124* 157* 122*  BUN 56*   < >  --  50* 52* 55* 59* 62*  CREATININE 1.75*   < >  --  1.59* 1.73* 1.96* 2.29* 2.21*  CALCIUM  9.2   < >  --  9.5 9.0 8.8* 8.8* 8.7*  MG 2.6*  --  2.5*  --  2.5* 2.7* 2.7*  --   PHOS  --   --  3.3  --  3.4 3.7 3.7  --    < > = values in this interval not displayed.   GFR: Estimated Creatinine Clearance: 33.1 mL/min (A) (by C-G formula based on SCr of 2.21 mg/dL (H)).  Liver Function Tests: No results for input(s): AST, ALT, ALKPHOS, BILITOT, PROT, ALBUMIN  in the last 168 hours. No results for input(s): LIPASE, AMYLASE in the last 168 hours. No  results for input(s): AMMONIA in the last 168 hours.  CBC: Recent Labs  Lab 11/18/23 1050 11/19/23 0433 11/20/23 0552 11/21/23 0530 11/22/23 0609  WBC 7.3 6.6 6.3 6.5 6.5  NEUTROABS  --  5.2  --   --   --   HGB 9.0* 7.9* 8.0* 7.9* 7.8*  HCT 30.6* 27.2* 27.4* 26.0* 25.8*  MCV 107.0* 106.3* 106.6* 105.7* 104.0*  PLT 154 125* 135* 126* 124*     HbA1C: Hgb A1c MFr Bld  Date/Time Value Ref Range Status  09/04/2023 04:06 AM 5.7 (H) 4.8 - 5.6 % Final    Comment:    (NOTE) Diagnosis of Diabetes The following HbA1c ranges recommended by the American Diabetes Association (ADA) may be used as an aid in the diagnosis of diabetes mellitus.  Hemoglobin             Suggested A1C NGSP%              Diagnosis  <5.7  Non Diabetic  5.7-6.4                Pre-Diabetic  >6.4                   Diabetic  <7.0                   Glycemic control for                       adults with diabetes.    09/09/2021 04:35 AM 6.0 (H) 4.8 - 5.6 % Final    Comment:    (NOTE) Pre diabetes:          5.7%-6.4%  Diabetes:              >6.4%  Glycemic control for   <7.0% adults with diabetes     Urinalysis: No results for input(s): COLORURINE, LABSPEC, PHURINE, GLUCOSEU, HGBUR, BILIRUBINUR, KETONESUR, PROTEINUR, UROBILINOGEN, NITRITE, LEUKOCYTESUR in the last 72 hours.  Invalid input(s): APPERANCEUR    Imaging: US  RENAL Result Date: 11/20/2023 CLINICAL DATA:  Acute kidney injury. EXAM: RENAL / URINARY TRACT ULTRASOUND COMPLETE COMPARISON:  03/11/2023 and 09/15/2022. Abdomen and pelvis CT dated 01/18/2013. FINDINGS: Right Kidney: Renal measurements: 9.5 x 5.8 x 4.7 cm = volume: 134 mL. Echogenicity within normal limits. 2.2 cm simple appearing cyst, not requiring imaging follow-up. No mass or hydronephrosis visualized. Left Kidney: Renal measurements: 10.0 x 5.0 x 4.5 cm = volume: 119 mL. Echogenicity within normal limits. Some portions of the kidney are  not well visualized due to shadowing from overlying bowel loops. No mass or hydronephrosis visualized. Specifically, the previously described 2.2 cm midpole hypoechoic mass was not clearly visualized today. There is a suggestion of a corresponding area partially obscured by overlying bowel-gas on image number 27/32. There is also a similar-appearing area in the lower pole of the left kidney on the CT dated 01/18/2013. Based on the current and previous images, this most likely represents a minimally complicated cyst. Bladder: Appears normal for degree of bladder distention. Other: None. IMPRESSION: 1. No acute abnormality. 2. The previously described 2.2 cm midpole left renal mass was not clearly visualized today. There is a suggestion of a corresponding area partially obscured by overlying bowel-gas on image number 27/32. There is also a similar-appearing area in the lower pole of the left kidney on the CT dated 01/18/2013. Based on the previous images, this most likely represents a minimally complicated cyst. This could be further evaluated with pre and postcontrast abdomen CT or MRI when the patient's condition permits. Electronically Signed   By: Elspeth Bathe M.D.   On: 11/20/2023 16:57     Medications:     allopurinol   50 mg Oral QODAY   amLODipine   10 mg Oral Daily   apixaban   5 mg Oral BID   ARIPiprazole   2 mg Oral QHS   citalopram   40 mg Oral QHS   feeding supplement  237 mL Oral BID BM   ferrous sulfate   325 mg Oral Daily   furosemide   40 mg Intravenous BID   insulin  aspart  0-15 Units Subcutaneous TID WC   insulin  aspart  0-5 Units Subcutaneous QHS   insulin  glargine-yfgn  20 Units Subcutaneous Daily   levothyroxine   100 mcg Oral QAC breakfast   melatonin  10 mg Oral QHS   multivitamin with minerals  1 tablet Oral Daily   pantoprazole   40 mg Oral Daily   polyethylene glycol  17 g Oral Daily   rosuvastatin   10 mg Oral Daily   senna-docusate  1 tablet Oral BID    Assessment/ Plan:      68 y.o. female with a PMHx of hypertension, coronary artery disease, atrial fibrillation, congestive heart failure, DVT/PE, history of IVC placement, morbid obesity, diabetes, peripheral vascular disease, chronic kidney disease admitted with history of intentional overdose of OxyContin .  She has been on diuretics.  Now she is found to have worsening renal insufficiency and so renal evaluation is requested.  She has been on torsemide  at 40 mg twice a day.  She has been fluid and dietary noncompliant     Principal Problem:   Intractable nausea and vomiting Active Problems:   Morbid obesity with BMI of 50.0-59.9, adult (HCC)   Essential hypertension   Pulmonary HTN, Moderate  PA pressure 09/2009   LBBB (left bundle branch block)   Thrombocytopenia (HCC)   Paroxysmal atrial fibrillation (HCC)   Type 2 diabetes mellitus with stage 3b chronic kidney disease, with long-term current use of insulin  (HCC)   Hypothyroidism   (HFpEF) heart failure with preserved ejection fraction (HCC)   Drug overdose, intentional, initial encounter (HCC)   Polyuria   CKD (chronic kidney disease) stage 4, GFR 15-29 ml/min (HCC)     #1: Chronic kidney disease: Patient has chronic kidney disease most likely secondary to diabetic kidney disease complicated by decreased cardiac output during the ischemic renal disease.  She had a renal sonogram which did not show any evidence of obstruction.  She has a 2.2 cm renal mass and is being evaluated by urology.   #2: CHF/hypertension: Continue amlodipine  at 10 mg daily.  Continue intravenous Lasix  40 mg twice a day for obligated diuresis.  She is advised on the importance of 2 g salt restricted diet along with fluid restriction.   #3: Diabetes: Continue insulin  as ordered.  Diet advised.   #4: Renal mass: Patient is scheduled for CT scan and is scheduled to follow-up with urology.   #5: Anemia: Anemia is most likely secondary to chronic kidney disease.  Continue  iron  supplementation.    Labs and medications reviewed. Will continue to follow along with you.   LOS: 9 Pinkey Edman, MD [facility] 8/17/202512:22 PM

## 2023-11-22 NOTE — Progress Notes (Signed)
 Progress Note    [name]  FMW:[id number] DOB: 10/23/1955  DOA: 11/11/2023 PCP: Valma Carwin, MD      Brief Narrative:    Medical records reviewed and are as summarized below:  [name] is a 68 y.o. female with pulmonary hypertension, hypertension, atrial fibrillation on Eliquis , history of DVT/PE with IVC, heart failure preserved ejection fraction, morbid obesity, CKD stage IV, chronic respiratory failure requiring 2 L nasal cannula at baseline, anxiety, insulin -dependent diabetes mellitus, who presented to the emergency department on 11/02/2023 because of intentional overdose of OxyContin  on 11/01/2023.  She was admitted to the behavioral health unit on 11/06/2023 for further management. Hospitalist team was consulted on 11/21/2023 because of concerns of intractable nausea and vomiting.  She also complained of constipation for about 2 weeks.  Patient was also found to have weeping edema from the abdomen and weeping edema from left leg with bloody drainage soaking through left Unna boot.     Assessment/Plan:   Principal Problem:   Intractable nausea and vomiting Active Problems:   Polyuria   Essential hypertension   Paroxysmal atrial fibrillation (HCC)   Type 2 diabetes mellitus with stage 3b chronic kidney disease, with long-term current use of insulin  (HCC)   (HFpEF) heart failure with preserved ejection fraction (HCC)   Morbid obesity with BMI of 50.0-59.9, adult (HCC)   LBBB (left bundle branch block)   Hypothyroidism   Pulmonary HTN, Moderate  PA pressure 09/2009   Thrombocytopenia (HCC)   Drug overdose, intentional, initial encounter (HCC)   CKD (chronic kidney disease) stage 4, GFR 15-29 ml/min (HCC)   # Acute on Chronic HFpEF, anasarca (ascites, peripheral edema)-  S/p IV Lasix . Held pm dose on 8/14   Monitor BMP, daily weight and urine output. Torsemide  on hold. Recent echo noted EF 60-65%, moderate LVH, normal RV -Recent R/LHC noted no  significant CAD, PA pressure 62/16, wedge of 11, PVR 4.4, group 3 pulmonary hypertension suspected 8/14 creatinine 1.73 slightly elevated, held Lasix  for now.  Monitor renal function daily and diurese accordingly. 8/17 sCr 1.96>>2.21 elevated 8/16 resumed Lasix  40 mg IV twice daily after discussion with nephrology.    # CKD (chronic kidney disease) stage 4, GFR 15-29 ml/min (HCC) Baseline creatinine around 1.6-2  sCr 1.73>>2.21 --Monitor BMP with diuresis    Chronic Lymphedema -- currently with increased, weeping edema Venous stasis ulcers of left lower extremity  Continue Unna boots on bilateral lower extremities. She had weeping edema (bloody drainage) from left leg.    Intractable nausea and vomiting - resolved Suspect due to severe constipation, no BM in 2 weeks. --Bowel regimen - escalate as needed --IV antiemetics as needed --Tolerating PO intake, no need for IV fluids --Monitor BMP 8/13 started PPI 40 mg IV twice daily followed by 40 mg p.o. daily for possible acid reflux/GERD   # UTI, Urine culture from 05/05/2023: Showed Enterobacter cloacae, pansensitive UA with only trace leukocytes, rare bacteria, 0-5 WBC's -- not consistent with UTI. Prior to admission patient was started on Keflex  so completed course for total 5 days.   Type 2 diabetes mellitus with long-term current use of insulin  (HCC) Continue Semglee  and NovoLog  as needed   Paroxysmal atrial fibrillation, bradycardia Continue Eliquis  HR in the 50s not on rate control drugs  # Hyperkalemia, Lokelma  given on 8/14 Check BMP daily   Drug overdose, intentional, initial encounter (HCC) Major depressive disorder, anxiety Continue Celexa  and Abilify  Pt was initially admitted to medical floor  from geropsych unit. One-on-one suicide precautions were lifted per psychiatry on 11/12/23. Pt can be discharged when medically stable, no longer requires psych admission.  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 daily from 8/15 Follow-up with PCP to repeat TSH level after 6 weeks.   Anemia of chronic disease Iron  profile, B12 within normal range Monitor H&H  Generalized weakness and fatigue, vitamin D  within normal range   Body mass index is 58.73 kg/m.  (Morbid obesity)  Diet Order             Diet Carb Modified Fluid consistency: Thin; Room service appropriate? Yes  Diet effective now                   Consultants: Psychiatrist Palliative care Vascular surgeon  Procedures: None   Medications:    allopurinol   50 mg Oral QODAY   amLODipine   10 mg Oral Daily   apixaban   5 mg Oral BID   ARIPiprazole   2 mg Oral QHS   citalopram   40 mg Oral QHS   feeding supplement  237 mL Oral BID BM   ferrous sulfate   325 mg Oral Daily   furosemide   40 mg Intravenous BID   insulin  aspart  0-15 Units Subcutaneous TID WC   insulin  aspart  0-5 Units Subcutaneous QHS   insulin  glargine-yfgn  20 Units Subcutaneous Daily   levothyroxine   100 mcg Oral QAC breakfast   melatonin  10 mg Oral QHS   multivitamin with minerals  1 tablet Oral Daily   pantoprazole   40 mg Oral Daily   polyethylene glycol  17 g Oral Daily   rosuvastatin   10 mg Oral Daily   senna-docusate  1 tablet Oral BID   Continuous Infusions:   Anti-infectives (From admission, onward)    None        Family Communication/Anticipated D/C date and plan/Code Status   DVT prophylaxis: Place TED hose Start: 11/11/23 1406 apixaban  (ELIQUIS ) tablet 5 mg     Code Status: Limited: Do not attempt resuscitation (DNR) -DNR-LIMITED -Do Not Intubate/DNI   Family Communication: None Disposition Plan: Plan to discharge to  SNF   Status is: Inpatient Remains inpatient appropriate because: Fluid overload    Subjective:   No significant events overnight.  Patient was resting comfortably, denied any acute issues Wound care is following.    Objective:    Vitals:   11/21/23 2016 11/22/23 0406 11/22/23 0450 11/22/23 1605  BP: (!) 129/43  (!) 151/55 (!) 138/50  Pulse: (!) 50   (!) 49  Resp: 18  18 16   Temp: 98.2 F (36.8 C)  98.1 F (36.7 C) 98.2 F (36.8 C)  TempSrc:      SpO2: 99%  100% 100%  Weight:  (!) 140.2 kg    Height:       No data found.   Intake/Output Summary (Last 24 hours) at 11/22/2023 1649 Last data filed at 11/22/2023 1300 Gross per 24 hour  Intake 1500 ml  Output --  Net 1500 ml   Filed Weights   11/20/23 0500 11/21/23 0500 11/22/23 0406  Weight: (!) 140.9 kg (!) 141 kg (!) 140.2 kg  Exam:   GEN: NAD SKIN: Warm and dry EYES: No pallor or icterus ENT: MMM CV: RRR PULM: Equal air entry bilaterally, mild bilateral crackles, no wheezes ABD: soft, soft, distended with fluid, NT, +BS CNS: AAO x 3, non focal EXT: 2-3+ edema, Unna boots intact, most of the edema is in her thighs and abdomen/pannus  No tenderness,    Data Reviewed:   I have personally reviewed following labs and imaging studies:  Labs: Labs show the following:   Basic Metabolic Panel: Recent Labs  Lab 11/16/23 0744 11/17/23 0534 11/17/23 1113 11/18/23 1050 11/19/23 0433 11/20/23 0552 11/21/23 0530 11/22/23 0609  NA 142   < >  --  141 141 142 139 139  K 4.8   < >  --  4.8 5.2* 4.7 4.0 3.8  CL 100   < >  --  98 99 100 101 98  CO2 30   < >  --  32 34* 32 32 32  GLUCOSE 114*   < >  --  116* 110* 124* 157* 122*  BUN 56*   < >  --  50* 52* 55* 59* 62*  CREATININE 1.75*   < >  --  1.59* 1.73* 1.96* 2.29* 2.21*  CALCIUM  9.2   < >  --  9.5 9.0 8.8* 8.8* 8.7*  MG 2.6*  --  2.5*  --  2.5* 2.7* 2.7*  --   PHOS  --   --  3.3  --  3.4 3.7 3.7  --    < > = values in this interval not  displayed.   GFR Estimated Creatinine Clearance: 33.1 mL/min (A) (by C-G formula based on SCr of 2.21 mg/dL (H)). Liver Function Tests: No results for input(s): AST, ALT, ALKPHOS, BILITOT, PROT, ALBUMIN  in the last 168 hours.  No results for input(s): LIPASE, AMYLASE in the last 168 hours. No results for input(s): AMMONIA in the last 168 hours. Coagulation profile No results for input(s): INR, PROTIME in the last 168 hours.  CBC: Recent Labs  Lab 11/18/23 1050 11/19/23 0433 11/20/23 0552 11/21/23 0530 11/22/23 0609  WBC 7.3 6.6 6.3 6.5 6.5  NEUTROABS  --  5.2  --   --   --   HGB 9.0* 7.9* 8.0* 7.9* 7.8*  HCT 30.6* 27.2* 27.4* 26.0* 25.8*  MCV 107.0* 106.3* 106.6* 105.7* 104.0*  PLT 154 125* 135* 126* 124*   Cardiac Enzymes: No results for input(s): CKTOTAL, CKMB, CKMBINDEX, TROPONINI in the last 168 hours. BNP (last 3 results) No results for input(s): PROBNP in the last 8760 hours. CBG: Recent Labs  Lab 11/21/23 1628 11/21/23 2015 11/22/23 0754 11/22/23 1144 11/22/23 1614  GLUCAP 183* 190* 117* 162* 154*   D-Dimer: No results for input(s): DDIMER in the last 72 hours. Hgb A1c: No results for input(s): HGBA1C in the last 72 hours. Lipid Profile: No results for input(s): CHOL, HDL, LDLCALC, TRIG, CHOLHDL, LDLDIRECT in the last 72 hours. Thyroid  function studies: No results for input(s): TSH, T4TOTAL, T3FREE, THYROIDAB in the last 72 hours.  Invalid input(s): FREET3 Anemia work up: No results for input(s): VITAMINB12, FOLATE, FERRITIN, TIBC, IRON , RETICCTPCT in the last 72 hours.  Sepsis Labs: Recent Labs  Lab 11/19/23 0433 11/20/23 0552 11/21/23 0530 11/22/23 0609  WBC 6.6 6.3 6.5 6.5    Microbiology No results found for this or any previous visit (from the past 240 hours).  Procedures and diagnostic studies:  No results found.     LOS: 9 days  Tenzin Edelman Von  [facility]   Pager on [email or web address]. If 7PM-7AM, please contact night-coverage at [email or web address]     11/22/2023, 4:49 PM

## 2023-11-23 LAB — CBC
HCT: 26.7 % — ABNORMAL LOW (ref 36.0–46.0)
Hemoglobin: 7.8 g/dL — ABNORMAL LOW (ref 12.0–15.0)
MCH: 30.8 pg (ref 26.0–34.0)
MCHC: 29.2 g/dL — ABNORMAL LOW (ref 30.0–36.0)
MCV: 105.5 fL — ABNORMAL HIGH (ref 80.0–100.0)
Platelets: 134 K/uL — ABNORMAL LOW (ref 150–400)
RBC: 2.53 MIL/uL — ABNORMAL LOW (ref 3.87–5.11)
RDW: 15.7 % — ABNORMAL HIGH (ref 11.5–15.5)
WBC: 6.7 K/uL (ref 4.0–10.5)
nRBC: 0 % (ref 0.0–0.2)

## 2023-11-23 LAB — GLUCOSE, CAPILLARY
Glucose-Capillary: 144 mg/dL — ABNORMAL HIGH (ref 70–99)
Glucose-Capillary: 147 mg/dL — ABNORMAL HIGH (ref 70–99)
Glucose-Capillary: 199 mg/dL — ABNORMAL HIGH (ref 70–99)
Glucose-Capillary: 165 mg/dL — ABNORMAL HIGH (ref 70–99)

## 2023-11-23 LAB — BASIC METABOLIC PANEL WITH GFR
Anion gap: 11 (ref 5–15)
BUN: 62 mg/dL — ABNORMAL HIGH (ref 8–23)
CO2: 32 mmol/L (ref 22–32)
Calcium: 8.9 mg/dL (ref 8.9–10.3)
Chloride: 98 mmol/L (ref 98–111)
Creatinine, Ser: 2.11 mg/dL — ABNORMAL HIGH (ref 0.44–1.00)
GFR, Estimated: 25 mL/min — ABNORMAL LOW (ref >60)
Glucose, Bld: 126 mg/dL — ABNORMAL HIGH (ref 70–99)
Potassium: 3.6 mmol/L (ref 3.5–5.1)
Sodium: 141 mmol/L (ref 135–145)

## 2023-11-23 MED ORDER — POLYSACCHARIDE IRON COMPLEX 150 MG PO CAPS
150.0000 mg | ORAL_CAPSULE | Freq: Every day | ORAL | Status: DC
  Administered 2023-11-24 – 2023-12-06 (×12): 150 mg via ORAL
  Filled 2023-11-23 (×14): qty 1

## 2023-11-23 NOTE — Progress Notes (Signed)
 Physical Therapy Treatment Patient Details Name: [name] MRN: [id number] DOB: [birth year] Today's Date: 11/23/2023   History of Present Illness [name] is a 67yoF 11/02/2023 after intentional ingestion of oxycodone  c SI. PMH: MDD, GAD,HTN, AF, DM2, DVT/PE status post IVC filter, HFpEF, single-chamber PPM and chronic pain. Pt recently started on home O2.    PT Comments  Pt resting in bed upon PT arrival; pt declining any OOB therapy/mobility (d/t reporting just returning to bed and was tired) but was agreeable to LE ex's in bed.  Mild SOB noted during ex's requiring brief rest breaks (SpO2 sats 94% or greater on 2 L O2 via nasal cannula).  Will focus on strengthening and progressive functional mobility during hospitalization.   If plan is discharge home, recommend the following: A little help with walking and/or transfers;A lot of help with bathing/dressing/bathroom;Assistance with cooking/housework;Assist for transportation;Help with stairs or ramp for entrance   Can travel by private vehicle        Equipment Recommendations  None recommended by PT    Recommendations for Other Services       Precautions / Restrictions Precautions Precautions: Fall Recall of Precautions/Restrictions: Intact Restrictions Weight Bearing Restrictions Per Provider Order: No     Mobility  Bed Mobility               General bed mobility comments: Pt declined OOB mobility (pt reports just returning to bed and was tired)    Transfers                        Ambulation/Gait                   Stairs             Wheelchair Mobility     Tilt Bed    Modified Rankin (Stroke Patients Only)       Balance                                            Communication Communication Communication: No apparent difficulties  Cognition Arousal: Alert Behavior During Therapy: WFL for tasks assessed/performed   PT - Cognitive impairments:  No apparent impairments                         Following commands: Intact      Cueing Cueing Techniques: Verbal cues  Exercises General Exercises - Lower Extremity Ankle Circles/Pumps: AROM, Strengthening, Both, 10 reps, Supine Quad Sets: AROM, Strengthening, Both, 10 reps, Supine Gluteal Sets: AROM, Strengthening, Both, 10 reps, Supine Short Arc Quad: AROM, Strengthening, Both, 10 reps, Supine Heel Slides: AROM, Strengthening, Both, 10 reps, Supine Hip ABduction/ADduction: AROM, Strengthening, Both, 10 reps, Supine    General Comments        Pertinent Vitals/Pain Pain Assessment Pain Assessment: Faces Faces Pain Scale: No hurt Pain Intervention(s): Limited activity within patient's tolerance, Monitored during session HR 50 bpm beginning/end of session.    Home Living                          Prior Function            PT Goals (current goals can now be found in the care plan section) Acute Rehab PT Goals Patient Stated Goal: transition to LTC  or progress function for safer independence PT Goal Formulation: With patient Time For Goal Achievement: 11/27/23 Potential to Achieve Goals: Fair Progress towards PT goals: Progressing toward goals    Frequency    Min 2X/week      PT Plan      Co-evaluation              AM-PAC PT 6 Clicks Mobility   Outcome Measure  Help needed turning from your back to your side while in a flat bed without using bedrails?: A Little Help needed moving from lying on your back to sitting on the side of a flat bed without using bedrails?: A Lot Help needed moving to and from a bed to a chair (including a wheelchair)?: A Little Help needed standing up from a chair using your arms (e.g., wheelchair or bedside chair)?: A Little Help needed to walk in hospital room?: A Little Help needed climbing 3-5 steps with a railing? : A Lot 6 Click Score: 16    End of Session Equipment Utilized During Treatment:  Oxygen (2 L via nasal cannula) Activity Tolerance: Patient tolerated treatment well Patient left: in bed;with call bell/phone within reach;with bed alarm set Nurse Communication: Mobility status;Precautions PT Visit Diagnosis: Difficulty in walking, not elsewhere classified (R26.2);Other abnormalities of gait and mobility (R26.89);Muscle weakness (generalized) (M62.81)     Time: 8441-8386 PT Time Calculation (min) (ACUTE ONLY): 15 min  Charges:    $Therapeutic Exercise: 8-22 mins PT General Charges $$ ACUTE PT VISIT: 1 Visit                     Damien Caulk, PT 11/23/23, 4:30 PM

## 2023-11-23 NOTE — Progress Notes (Signed)
 Flat ostomy pouch Soila # [phone]) to left abd fold leaking area not containing drainage. MD and Wound Care, RN notified.

## 2023-11-23 NOTE — Consult Note (Signed)
 [facility] Consult Follow up  Patient Name: .[name]  MRN: [id number]  DOB: [birth year]  Consult Order details:  Orders (From admission, onward)     Start     Ordered   11/12/23 0842  IP CONSULT TO PSYCHIATRY       Ordering Provider: Fausto Burnard LABOR, DO  Provider:  (Not yet assigned)  Question Answer Comment  Location [facility]   Reason for Consult? Admitted to medicine from geropsych      11/12/23 0841             Mode of Visit: In person    Psychiatry Consult Evaluation  Service Date: November 23, 2023 LOS:  LOS: 10 days  Chief Complaint depression  Primary Psychiatric Diagnoses  MDD moderate   Assessment  [name] is a 68 y.o. female admitted: MedicallySheila [name] is a 68 year old female with pulmonary hypertension, hypertension, atrial fibrillation on Eliquis , history of DVT/PE with IVC, heart failure preserved ejection fraction, morbid obesity, CKD stage IV, chronic respiratory failure requiring 2 L nasal cannula at baseline, anxiety, insulin -dependent diabetes mellitus, who presents emergency department on 11/02/2023 for chief concerns of intentional overdose of OxyContin  on 11/01/2023.Patient was admitted to behavioral health service on 11/06/2023 for the same. Hospitalist service was consulted on 11/11/2023 for chief concerns of intractable nausea and vomiting.Patient was admitted to medical floor.Psychiatry is consulted for follow up.  11/23/23: On assessment patient is awake, alert, oriented x 4.  She reports doing very well and denies any depression or anxiety at this time.  She reports that the medical team is working on her transition to community with resources.  She denies SI/HI/plan.  She is taking her medications with no reported side effects.  Patient is psychiatrically at baseline.  Will be signing off at this point.     Diagnoses:  Active Hospital problems: Principal Problem:   Intractable nausea and  vomiting Active Problems:   Morbid obesity with BMI of 50.0-59.9, adult (HCC)   Essential hypertension   Pulmonary HTN, Moderate  PA pressure 09/2009   LBBB (left bundle branch block)   Thrombocytopenia (HCC)   Paroxysmal atrial fibrillation (HCC)   Type 2 diabetes mellitus with stage 3b chronic kidney disease, with long-term current use of insulin  (HCC)   Hypothyroidism   (HFpEF) heart failure with preserved ejection fraction (HCC)   Drug overdose, intentional, initial encounter (HCC)   Polyuria   CKD (chronic kidney disease) stage 4, GFR 15-29 ml/min (HCC)    Plan   ## Psychiatric Medication Recommendations:  Continue Celexa  and Abilify   ## Medical Decision Making Capacity: Not specifically addressed in this encounter  ## Further Work-up:   Ongoing medical workup through vascular surgeon, CBC CMP   ## Disposition:-- There are no psychiatric contraindications to discharge at this time  ## Behavioral / Environmental: -Utilize compassion and acknowledge the patient's experiences while setting clear and realistic expectations for care.    ## Safety and Observation Level:  - Based on my clinical evaluation, I estimate the patient to be at low risk of self harm in the current setting. - At this time, we recommend  routine. This decision is based on my review of the chart including patient's history and current presentation, interview of the patient, mental status examination, and consideration of suicide risk including evaluating suicidal ideation, plan, intent, suicidal or self-harm behaviors, risk factors, and protective factors. This judgment is based on our ability to directly address suicide risk, implement  suicide prevention strategies, and develop a safety plan while the patient is in the clinical setting. Please contact our team if there is a concern that risk level has changed.  CSSR Risk Category:LOW risk  Suicide Risk Assessment: Patient has following modifiable  risk factors for suicide: Ongoing medical problems which we are addressing by optimizing medical treatment and involving palliative care. Patient has following non-modifiable or demographic risk factors for suicide: psychiatric hospitalization Patient has the following protective factors against suicide: Supportive family  Thank you for this consult request. Recommendations have been communicated to the primary team.  We will sign off at this time.   Billy Rocco, MD       History of Present Illness  Relevant Aspects of [facility]   11/23/23: Today on interview patient is noted to be resting in bed.  She greeted the provider.  She reports that she is taking her psychiatric medications with no reported side effects.  She denies feeling depressed or anxious.  She reports that the primary team is helping her to transition into the community.  Provider asked if she needs any adjustment in medications and patient declined.  Patient denies SI/HI/plan and denies hallucinations.   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 Hx: Denies Living Situation: Lives alone in private residence Spiritual Hx: Christian Access to weapons/lethal means: Denies    Substance History Alcohol: Denies use History of alcohol withdrawal seizures: N/a History of DT's: N/a Tobacco: Former; quit in 1990 Illicit drugs: No Prescription drug abuse: No Rehab hx: No  Exam Findings  Physical Exam:  Reviewed and agree with the treatment plan Vital Signs:  Temp:  [98.2 F (36.8 C)-99.2 F (37.3 C)] 98.2 F (36.8 C) (08/18 0802) Pulse Rate:  [49-50] 50 (08/18 0802) Resp:  [16-18] 18 (08/18 0802) BP: (127-154)/(38-59) 154/59 (08/18 0802) SpO2:  [99 %-100 %] 99 % (08/18 0802) Weight:  [140.9 kg] 140.9 kg (08/18 0438) Blood pressure (!) 154/59, pulse (!) 50, temperature 98.2 F (36.8 C), resp. rate 18, height 5' 1 (1.549 m), weight (!) 140.9 kg, SpO2 99%. Body mass index is 58.69 kg/m.    Mental Status Exam: General Appearance: Stated age casual  Orientation:  Full (Time, Place, and Person)  Memory:  Immediate;   Fair Recent;   Fair Remote;   Fair  Concentration:  Concentration: Fair and Attention Span: Fair  Recall:  Fair  Attention  Fair  Eye Contact:  Fair  Speech:  Clear and Coherent  Language:  Fair  Volume:  Normal  Mood: fine  Affect:  Appropriate  Thought Process:  Coherent  Thought Content:  Logical  Suicidal Thoughts:  No  Homicidal Thoughts:  No  Judgement: Fair  Insight:  Present  Psychomotor Activity:  Decreased  Akathisia:  No  Fund of Knowledge:  Fair      Assets:  Communication Skills  Cognition:  WNL  ADL's:  Impaired  AIMS (if indicated):        Other History   These have been pulled in through  the EMR, reviewed, and updated if appropriate.  Family History:  The patient's family history includes Cancer in her maternal uncle; Cerebral aneurysm in her maternal aunt, maternal grandfather, and mother; Hypertension in her father.  Medical History: Past Medical History:  Diagnosis Date   A-fib (HCC)    Anemia    Anticoagulated on Coumadin , chronically 09/03/2011   Anxiety    Arthritis    right hip; both knees; left wrist/shoulder; back (01/19/2013   Bleeding on Coumadin  08/2012; 01/18/2013   [facility] admissions (01/19/2013)   CHF (congestive heart failure) (HCC)    2-3 times (01/19/2013)   Chronic lower back pain    Depression    DVT  (deep venous thrombosis) (HCC) 10 years ago   numerous/notes 01/18/2013   GERD (gastroesophageal reflux disease)    Gout    Headache(784.0)    maybe weekly (01/19/2013)   Heart murmur    High cholesterol    been off RX for this at one time (01/18/2013)   History of blood transfusion 1983; 04/2012   3 w/ childbirth; hospitalized for pain (01/19/2013)   Hypertension    Hypothyroidism    Migraines    twice/yr maybe (01/19/2013)   Obstructive sleep apnea 05/03/2012   OSA (obstructive sleep apnea)    sent me for test in 04/2012; never ordered mask, etc (01/19/2013)   PE (pulmonary thromboembolism) (HCC) 3 years ag0   3/notes 01/18/2013   Pneumonia before 2011   once' (01/18/2013)   Renal disorder    kindey function low; Metformin was destroying my kidneys (01/19/2013)   Shortness of breath    only related to my CHF (01/18/2013)   Swelling of hand 08/31/2014   RT HAND   Type II diabetes mellitus (HCC)    UTI (urinary tract infection) 08/31/2014    Surgical History: Past Surgical History:  Procedure Laterality Date   CARDIAC CATHETERIZATION N/A 03/13/2015   Procedure: Right/Left Heart Cath and Coronary Angiography;  Surgeon: Gordy Bergamo, MD;  Location: [facility] INVAS[facility] CV LAB;  Service: Cardiovascular;  Laterality: N/A;   CATARACT EXTRACTION W/ INTRAOCULAR LENS  IMPLANT, BILATERAL Bilateral 2006-2011   CESAREAN SECTION  1983   CHOLECYSTECTOMY  ~ 2002   COLONOSCOPY N/A 01/21/2013   Procedure: COLONOSCOPY;  Surgeon: Belvie JONETTA Just, MD;  Location: [facility]COPY;  Service: Endoscopy;  Laterality: N/A;   EYE SURGERY Bilateral    multiple (01/18/2013)   PACEMAKER IMPLANT N/A 08/31/2018   Symptomatic bradycardia due to mobitz II second degree AV block, permanent afib/ atypical atrial flutter implanted by Dr Kelsie PRESCOTT PLANA REPAIR OF RETINAL DEATACHMENT Right    PARS PLANA VITRECTOMY Bilateral 2004-2006   several (01/18/2013)   REFRACTIVE SURGERY Bilateral    for  stigmatism (01/18/2013)   REFRACTIVE SURGERY Left ~ 11/2012   to puff it up cause vision got hazy (01/18/2013)   RIGHT HEART CATH N/A 12/30/2018   Procedure: RIGHT HEART CATH;  Surgeon: Cherrie Toribio SAUNDERS, MD;  Location: [facility] INVASIVE CV LAB;  Service: Cardiovascular;  Laterality: N/A;   RIGHT/LEFT HEART CATH AND CORONARY ANGIOGRAPHY N/A 09/07/2023   Procedure: RIGHT/LEFT HEART CATH AND CORONARY ANGIOGRAPHY;  Surgeon: Wendel Lurena POUR, MD;  Location: [facility] INVASIVE CV LAB;  Service: Cardiovascular;  Laterality: N/A;   VENA CAVA FILTER PLACEMENT  2011?     Medications:   Current Facility-Administered Medications:    allopurinol  (ZYLOPRIM ) tablet 50 mg, 50 mg, Oral, QODAY, Cox, Amy N, DO, 50 mg at 11/22/23 0942   alum & mag  hydroxide-simeth (MAALOX/MYLANTA) 200-200-20 MG/5ML suspension 30 mL, 30 mL, Oral, Q4H PRN, Itzae Miralles, MD   amLODipine  (NORVASC ) tablet 10 mg, 10 mg, Oral, Daily, Cox, Amy N, DO, 10 mg at 11/23/23 9066   apixaban  (ELIQUIS ) tablet 5 mg, 5 mg, Oral, BID, Ayiku, Bernard, MD, 5 mg at 11/23/23 9066   ARIPiprazole  (ABILIFY ) tablet 2 mg, 2 mg, Oral, QHS, Kristalyn Bergstresser, MD, 2 mg at 11/22/23 2118   citalopram  (CELEXA ) tablet 40 mg, 40 mg, Oral, QHS, Onaje Warne, MD, 40 mg at 11/22/23 2118   diazepam  (VALIUM ) tablet 5 mg, 5 mg, Oral, BID PRN, Cox, Amy N, DO   feeding supplement (ENSURE PLUS HIGH PROTEIN) liquid 237 mL, 237 mL, Oral, BID BM, Fausto Sor A, DO, 237 mL at 11/17/23 0930   furosemide  (LASIX ) injection 40 mg, 40 mg, Intravenous, BID, Von Bellis, MD, 40 mg at 11/23/23 9066   guaiFENesin -dextromethorphan  (ROBITUSSIN DM) 100-10 MG/5ML syrup 5 mL, 5 mL, Oral, Q6H PRN, Cox, Amy N, DO   insulin  aspart (novoLOG ) injection 0-15 Units, 0-15 Units, Subcutaneous, TID WC, Cox, Amy N, DO, 2 Units at 11/23/23 1214   insulin  aspart (novoLOG ) injection 0-5 Units, 0-5 Units, Subcutaneous, QHS, Cox, Amy N, DO, 2 Units at 11/18/23 2125   insulin  glargine-yfgn (SEMGLEE )  injection 20 Units, 20 Units, Subcutaneous, Daily, Cox, Amy N, DO, 20 Units at 11/22/23 2118   [START ON 11/24/2023] iron  polysaccharides (NIFEREX) capsule 150 mg, 150 mg, Oral, Daily, Von Bellis, MD   levothyroxine  (SYNTHROID ) tablet 100 mcg, 100 mcg, Oral, QAC breakfast, Von Bellis, MD, 100 mcg at 11/23/23 9371   melatonin tablet 10 mg, 10 mg, Oral, QHS, Cox, Amy N, DO, 10 mg at 11/22/23 2118   multivitamin with minerals tablet 1 tablet, 1 tablet, Oral, Daily, Shonice Wrisley, MD, 1 tablet at 11/23/23 9066   neomycin -bacitracin -polymyxin [phone] OINT 1 Application, 1 Application, Apply externally, BID PRN, Wallice Granville, MD   nystatin  (MYCOSTATIN /NYSTOP ) topical powder, , Topical, TID PRN, Kylah Maresh, MD   ondansetron  (ZOFRAN ) injection 4 mg, 4 mg, Intravenous, Q6H PRN, Von Bellis, MD, 4 mg at 11/23/23 9071   Oral care mouth rinse, 15 mL, Mouth Rinse, PRN, Cox, Amy N, DO   oxyCODONE  (Oxy IR/ROXICODONE ) immediate release tablet 5 mg, 5 mg, Oral, Q6H PRN, Lexandra Rettke, MD   [COMPLETED] pantoprazole  (PROTONIX ) injection 40 mg, 40 mg, Intravenous, Q12H, 40 mg at 11/18/23 2125 **FOLLOWED BY** pantoprazole  (PROTONIX ) EC tablet 40 mg, 40 mg, Oral, Daily, Von Bellis, MD, 40 mg at 11/23/23 0933   polyethylene glycol (MIRALAX  / GLYCOLAX ) packet 17 g, 17 g, Oral, Daily, Fausto Sor A, DO, 17 g at 11/19/23 0820   rosuvastatin  (CRESTOR ) tablet 10 mg, 10 mg, Oral, Daily, Branden Vine, MD, 10 mg at 11/23/23 9066   senna-docusate (Senokot-S) tablet 1 tablet, 1 tablet, Oral, BID, Fausto Sor LABOR, DO, 1 tablet at 11/22/23 2118  Allergies: Allergies  Allergen Reactions   Ms Contin [Morphine] Hives, Rash and Other (See Comments)    Broke out in brown spots all over.     Saraya Tirey, MD

## 2023-11-23 NOTE — Progress Notes (Signed)
 Progress Note    [name]  FMW:[id number] DOB: 03-24-1956  DOA: 11/11/2023 PCP: Valma Carwin, MD      Brief Narrative:    Medical records reviewed and are as summarized below:  [name] is a 68 y.o. female with pulmonary hypertension, hypertension, atrial fibrillation on Eliquis , history of DVT/PE with IVC, heart failure preserved ejection fraction, morbid obesity, CKD stage IV, chronic respiratory failure requiring 2 L nasal cannula at baseline, anxiety, insulin -dependent diabetes mellitus, who presented to the emergency department on 11/02/2023 because of intentional overdose of OxyContin  on 11/01/2023.  She was admitted to the behavioral health unit on 11/06/2023 for further management. Hospitalist team was consulted on 11/21/2023 because of concerns of intractable nausea and vomiting.  She also complained of constipation for about 2 weeks.  Patient was also found to have weeping edema from the abdomen and weeping edema from left leg with bloody drainage soaking through left Unna boot.     Assessment/Plan:   Principal Problem:   Intractable nausea and vomiting Active Problems:   Polyuria   Essential hypertension   Paroxysmal atrial fibrillation (HCC)   Type 2 diabetes mellitus with stage 3b chronic kidney disease, with long-term current use of insulin  (HCC)   (HFpEF) heart failure with preserved ejection fraction (HCC)   Morbid obesity with BMI of 50.0-59.9, adult (HCC)   LBBB (left bundle branch block)   Hypothyroidism   Pulmonary HTN, Moderate  PA pressure 09/2009   Thrombocytopenia (HCC)   Drug overdose, intentional, initial encounter (HCC)   CKD (chronic kidney disease) stage 4, GFR 15-29 ml/min (HCC)   # Acute on Chronic HFpEF, anasarca (ascites, peripheral edema)-  S/p IV Lasix . Held pm dose on 8/14   Monitor BMP, daily weight and urine output. Torsemide  on hold. Recent echo noted EF 60-65%, moderate LVH, normal RV -Recent R/LHC noted no  significant CAD, PA pressure 62/16, wedge of 11, PVR 4.4, group 3 pulmonary hypertension suspected 8/14 creatinine 1.73 slightly elevated, held Lasix  for now.  Monitor renal function daily and diurese accordingly. 8/17 sCr 1.96>>2.21 elevated 8/16 resumed Lasix  40 mg IV twice daily after discussion with nephrology.    # CKD (chronic kidney disease) stage 4, GFR 15-29 ml/min (HCC) Baseline creatinine around 1.6-2  sCr 1.73>>2.21 --Monitor BMP with diuresis    Chronic Lymphedema -- currently with increased, weeping edema Venous stasis ulcers of left lower extremity  Continue Unna boots on bilateral lower extremities. She had weeping edema (bloody drainage) from left leg.    Intractable nausea and vomiting - resolved Suspect due to severe constipation, no BM in 2 weeks. --Bowel regimen - escalate as needed --IV antiemetics as needed --Tolerating PO intake, no need for IV fluids --Monitor BMP 8/13 started PPI 40 mg IV twice daily followed by 40 mg p.o. daily for possible acid reflux/GERD   # UTI, Urine culture from 05/05/2023: Showed Enterobacter cloacae, pansensitive UA with only trace leukocytes, rare bacteria, 0-5 WBC's -- not consistent with UTI. Prior to admission patient was started on Keflex  so completed course for total 5 days.   Type 2 diabetes mellitus with long-term current use of insulin  (HCC) Continue Semglee  and NovoLog  as needed   Paroxysmal atrial fibrillation, bradycardia Continue Eliquis  HR in the 50s not on rate control drugs  # Hyperkalemia, Lokelma  given on 8/14.  Resolved Check BMP daily   Drug overdose, intentional, initial encounter (HCC) Major depressive disorder, anxiety Continue Celexa  and Abilify  Pt was initially admitted to  medical floor from geropsych unit. One-on-one suicide precautions were lifted per psychiatry on 11/12/23. Pt can be discharged when medically stable, no longer requires psych admission.  OHS/OSA Pulmonary HTN type  III --Doesn't tolerate CPAP --Supplement O2 as needed, target sats > 90%     History of DVT and PE s/p IVC filter on Eliquis    Hypertension Low diastolic BP Continue amlodipine  and monitor closely   Hypothyroidism: Continue Synthroid  TSH 14.9 elevated, increased dose of Synthroid  from 75 to 100 mcg p.o. daily from 8/15 Follow-up with PCP to repeat TSH level after 6 weeks.   Anemia of chronic disease Iron  profile, B12 within normal range Monitor H&H  Generalized weakness and fatigue, vitamin D  within normal range   Body mass index is 58.73 kg/m.  (Morbid obesity)  Diet Order             Diet Carb Modified Fluid consistency: Thin; Room service appropriate? Yes  Diet effective now                   Consultants: Psychiatrist Palliative care Vascular surgeon  Procedures: None   Medications:    allopurinol   50 mg Oral QODAY   amLODipine   10 mg Oral Daily   apixaban   5 mg Oral BID   ARIPiprazole   2 mg Oral QHS   citalopram   40 mg Oral QHS   feeding supplement  237 mL Oral BID BM   furosemide   40 mg Intravenous BID   insulin  aspart  0-15 Units Subcutaneous TID WC   insulin  aspart  0-5 Units Subcutaneous QHS   insulin  glargine-yfgn  20 Units Subcutaneous Daily   [START ON 11/24/2023] iron  polysaccharides  150 mg Oral Daily   levothyroxine   100 mcg Oral QAC breakfast   melatonin  10 mg Oral QHS   multivitamin with minerals  1 tablet Oral Daily   pantoprazole   40 mg Oral Daily   polyethylene glycol  17 g Oral Daily   rosuvastatin   10 mg Oral Daily   senna-docusate  1 tablet Oral BID   Continuous Infusions:   Anti-infectives (From admission, onward)    None        Family Communication/Anticipated D/C date and plan/Code Status   DVT prophylaxis: Place TED hose Start: 11/11/23 1406 apixaban  (ELIQUIS ) tablet 5 mg     Code Status: Limited: Do not attempt resuscitation (DNR) -DNR-LIMITED -Do Not Intubate/DNI   Family Communication:  None Disposition Plan: Plan to discharge to SNF   Status is: Inpatient Remains inpatient appropriate because: Fluid overload    Subjective:   No significant events overnight.  Patient has nausea and vomiting in the morning soon after having morning medications.  Patient thinks that this may be due to iron  pill. Discontinued ferrous sulfate  and started on Niferex. Patient was advised to take PPI in the morning and then take other medications and then take Niferex iron  pill at the end. Patient denied any other complaints.    Objective:    Vitals:   11/22/23 1605 11/22/23 2019 11/23/23 0438 11/23/23 0802  BP: (!) 138/50 (!) 127/38 (!) 140/47 (!) 154/59  Pulse: (!) 49 (!) 49 (!) 50 (!) 50  Resp: 16 18 18 18   Temp: 98.2 F (36.8 C) 98.2 F (36.8 C) 99.2 F (37.3 C) 98.2 F (36.8 C)  TempSrc:   Oral   SpO2: 100% 100% 100% 99%  Weight:   (!) 140.9 kg   Height:       No data found.  Intake/Output Summary (Last 24 hours) at 11/23/2023 1452 Last data filed at 11/23/2023 1300 Gross per 24 hour  Intake 480 ml  Output --  Net 480 ml   Filed Weights   11/21/23 0500 11/22/23 0406 11/23/23 0438  Weight: (!) 141 kg (!) 140.2 kg (!) 140.9 kg    Exam:   GEN: NAD SKIN: Warm and dry EYES: No pallor or icterus ENT: MMM CV: RRR PULM: Equal air entry bilaterally, mild bilateral crackles, no wheezes ABD: soft, soft, distended with fluid, NT, +BS CNS: AAO x 3, non focal EXT: 2-3+ edema, Unna boots intact, most of the edema is in her thighs and abdomen/pannus  No tenderness,    Data Reviewed:   I have personally reviewed following labs and imaging studies:  Labs: Labs show the following:   Basic Metabolic Panel: Recent Labs  Lab 11/17/23 1113 11/18/23 1050 11/19/23 0433 11/20/23 0552 11/21/23 0530 11/22/23 0609 11/23/23 0542  NA  --    < > 141 142 139 139 141  K  --    < > 5.2* 4.7 4.0 3.8 3.6  CL  --    < > 99 100 101 98 98  CO2  --    < > 34* 32 32 32 32   GLUCOSE  --    < > 110* 124* 157* 122* 126*  BUN  --    < > 52* 55* 59* 62* 62*  CREATININE  --    < > 1.73* 1.96* 2.29* 2.21* 2.11*  CALCIUM   --    < > 9.0 8.8* 8.8* 8.7* 8.9  MG 2.5*  --  2.5* 2.7* 2.7*  --   --   PHOS 3.3  --  3.4 3.7 3.7  --   --    < > = values in this interval not displayed.   GFR Estimated Creatinine Clearance: 34.7 mL/min (A) (by C-G formula based on SCr of 2.11 mg/dL (H)). Liver Function Tests: No results for input(s): AST, ALT, ALKPHOS, BILITOT, PROT, ALBUMIN  in the last 168 hours.  No results for input(s): LIPASE, AMYLASE in the last 168 hours. No results for input(s): AMMONIA in the last 168 hours. Coagulation profile No results for input(s): INR, PROTIME in the last 168 hours.  CBC: Recent Labs  Lab 11/19/23 0433 11/20/23 0552 11/21/23 0530 11/22/23 0609 11/23/23 0542  WBC 6.6 6.3 6.5 6.5 6.7  NEUTROABS 5.2  --   --   --   --   HGB 7.9* 8.0* 7.9* 7.8* 7.8*  HCT 27.2* 27.4* 26.0* 25.8* 26.7*  MCV 106.3* 106.6* 105.7* 104.0* 105.5*  PLT 125* 135* 126* 124* 134*   Cardiac Enzymes: No results for input(s): CKTOTAL, CKMB, CKMBINDEX, TROPONINI in the last 168 hours. BNP (last 3 results) No results for input(s): PROBNP in the last 8760 hours. CBG: Recent Labs  Lab 11/22/23 1144 11/22/23 1614 11/22/23 2021 11/23/23 0803 11/23/23 1208  GLUCAP 162* 154* 194* 144* 147*   D-Dimer: No results for input(s): DDIMER in the last 72 hours. Hgb A1c: No results for input(s): HGBA1C in the last 72 hours. Lipid Profile: No results for input(s): CHOL, HDL, LDLCALC, TRIG, CHOLHDL, LDLDIRECT in the last 72 hours. Thyroid  function studies: No results for input(s): TSH, T4TOTAL, T3FREE, THYROIDAB in the last 72 hours.  Invalid input(s): FREET3 Anemia work up: No results for input(s): VITAMINB12, FOLATE, FERRITIN, TIBC, IRON , RETICCTPCT in the last 72 hours.  Sepsis Labs: Recent Labs   Lab 11/20/23 [phone] 11/21/23 0530 11/22/23 9390  11/23/23 0542  WBC 6.3 6.5 6.5 6.7    Microbiology No results found for this or any previous visit (from the past 240 hours).  Procedures and diagnostic studies:  No results found.     LOS: 10 days   Sufyan Meidinger Von  [facility]   Pager on [email or web address]. If 7PM-7AM, please contact night-coverage at [email or web address]     11/23/2023, 2:52 PM

## 2023-11-23 NOTE — Progress Notes (Signed)
 PT Cancellation Note  Patient Details Name: [name] MRN: [id number] DOB: Feb 08, 1956   Cancelled Treatment:    Reason Eval/Treat Not Completed: Other (comment).  Pt resting in bed upon PT arrival; pt reports she has been nauseas and vomiting this morning and declined therapy at this time (pt reports nurse aware and gave her nausea medication already).  Will re-attempt PT session at a later date/time.  Damien Caulk, PT 11/23/23, 10:11 AM

## 2023-11-23 NOTE — Progress Notes (Signed)
 Occupational Therapy Treatment Patient Details Name: [name] MRN: [id number] DOB: 06-23-1955 Today's Date: 11/23/2023   History of present illness [name] is a 67yoF 11/02/2023 after intentional ingestion of oxycodone  c SI. PMH: MDD, GAD,HTN, AF, DM2, DVT/PE status post IVC filter, HFpEF, single-chamber PPM and chronic pain. Pt recently started on home O2.   OT comments  Pt seen for OT tx. Pt endorses mild SOB and fatigue having recently returned to bed and declines EOB/OOB. Pt edu: activity pacing, PLB, breaks during conversation so she doesn't feel SOB. Required VC for pausing conversation. Pt endorses being known for trying to multi-task. Pt educated in AE/DME and strategies to minimize SOB and falls risk with ADL. Continues to benefit from skilled OT services.       If plan is discharge home, recommend the following:  A little help with bathing/dressing/bathroom;Help with stairs or ramp for entrance   Equipment Recommendations  Other (comment) (RW)       Precautions / Restrictions Precautions Precautions: Fall Recall of Precautions/Restrictions: Intact Restrictions Weight Bearing Restrictions Per Provider Order: No       Mobility Bed Mobility      General bed mobility comments: Pt declined OOB mobility (pt reports just returning to bed and was tired)          ADL either performed or assessed with clinical judgement    Communication Communication Communication: No apparent difficulties   Cognition Arousal: Alert Behavior During Therapy: WFL for tasks assessed/performed Cognition: No apparent impairments    Following commands: Intact        Cueing   Cueing Techniques: Verbal cues  Exercises Other Exercises Other Exercises: Pt edu: activity pacing, PLB, breaks during conversation so she doesn't feel SOB            Pertinent Vitals/ Pain       Pain Assessment Pain Assessment: No/denies pain   Frequency  Min 1X/week         Progress Toward Goals  OT Goals(current goals can now be found in the care plan section)  Progress towards OT goals: Progressing toward goals  Acute Rehab OT Goals Patient Stated Goal: go to LTC OT Goal Formulation: With patient Time For Goal Achievement: 11/27/23 Potential to Achieve Goals: Fair   AM-PAC OT 6 Clicks Daily Activity     Outcome Measure   Help from another person eating meals?: None Help from another person taking care of personal grooming?: None Help from another person toileting, which includes using toliet, bedpan, or urinal?: A Little Help from another person bathing (including washing, rinsing, drying)?: A Lot Help from another person to put on and taking off regular upper body clothing?: A Little Help from another person to put on and taking off regular lower body clothing?: A Lot 6 Click Score: 18    End of Session Equipment Utilized During Treatment: Oxygen  OT Visit Diagnosis: Unsteadiness on feet (R26.81);Muscle weakness (generalized) (M62.81)   Activity Tolerance Patient tolerated treatment well   Patient Left in bed;with call bell/phone within reach;with bed alarm set    Time: 8374-8361 OT Time Calculation (min): 13 min  Charges: OT General Charges $OT Visit: 1 Visit OT Treatments $Therapeutic Activity: 8-22 mins  Warren SAUNDERS., MPH, MS, OTR/L ascom [phone] 11/23/23, 4:45 PM

## 2023-11-23 NOTE — Consult Note (Addendum)
 WOC Nurse wound follow up Bilat leg wounds with reduced edema and previously noted wounds have greatly improved and are red and moist and shallow.  Left outer abd in skin fold with partial thickness fissure with large amt yellow drainage leaking.  2X.2X.1cm, no depth when palpated with a swab; painful to touch related to moisture associated skin damage.    Left anterior calf full thickness wound 1X1X.1cm and 1.5X1.5X.1cm, mod amt yellow drainage Right anterior calf full thickness wound 3X3X.1cm, mod amt pink drainage.  Topical treatment orders provided for bedside nurses to perform as follows:  1. Apply flat ostomy pouch Soila # [id number]) to left abd fold leaking area and change PRN to attempt to contain drainage 2. Apply Aquacel Soila # [phone]) to bilat leg wounds Q Mon/Wed/Fri, then cover with foam dressings.  Please re-consult if further assistance is needed.  Thank-you,  Stephane Fought MSN, RN, CWOCN, CWCN-AP, CNS Contact Mon-Fri 0700-1500: [phone]

## 2023-11-23 NOTE — Plan of Care (Signed)
  Problem: Education: Goal: Knowledge of General Education information will improve Description: Including pain rating scale, medication(s)/side effects and non-pharmacologic comfort measures Outcome: Progressing   Problem: Health Behavior/Discharge Planning: Goal: Ability to manage health-related needs will improve Outcome: Progressing   Problem: Clinical Measurements: Goal: Ability to maintain clinical measurements within normal limits will improve Outcome: Progressing Goal: Will remain free from infection Outcome: Progressing Goal: Diagnostic test results will improve Outcome: Progressing Goal: Respiratory complications will improve Outcome: Progressing Goal: Cardiovascular complication will be avoided Outcome: Progressing   Problem: Activity: Goal: Risk for activity intolerance will decrease Outcome: Progressing   Problem: Nutrition: Goal: Adequate nutrition will be maintained Outcome: Progressing   Problem: Coping: Goal: Level of anxiety will decrease Outcome: Progressing   Problem: Elimination: Goal: Will not experience complications related to bowel motility Outcome: Progressing Goal: Will not experience complications related to urinary retention Outcome: Progressing   Problem: Pain Managment: Goal: General experience of comfort will improve and/or be controlled Outcome: Progressing   Problem: Safety: Goal: Ability to remain free from injury will improve Outcome: Progressing   Problem: Skin Integrity: Goal: Risk for impaired skin integrity will decrease Outcome: Progressing   Problem: Education: Goal: Ability to describe self-care measures that may prevent or decrease complications (Diabetes Survival Skills Education) will improve Outcome: Progressing

## 2023-11-23 NOTE — Progress Notes (Signed)
 [facility]  PROGRESS NOTE   Subjective:   Patient seen laying in bed  Alert and oriented Follows with Dr Dennise, [facility] Tolerating meals at this time  Creatinine 2.11  Objective:  Vital signs: Blood pressure (!) 154/59, pulse (!) 50, temperature 98.2 F (36.8 C), resp. rate 18, height 5' 1 (1.549 m), weight (!) 140.9 kg, SpO2 99%.  Intake/Output Summary (Last 24 hours) at 11/23/2023 1211 Last data filed at 11/23/2023 0900 Gross per 24 hour  Intake 480 ml  Output --  Net 480 ml   Filed Weights   11/21/23 0500 11/22/23 0406 11/23/23 0438  Weight: (!) 141 kg (!) 140.2 kg (!) 140.9 kg     Physical Exam: General:  No acute distress  Head:  Normocephalic, atraumatic. Moist oral mucosal membranes  Eyes:  Anicteric  Lungs:   Clear to auscultation, normal effort  Heart:  S1S2 no rubs  Abdomen:   Soft, nontender, bowel sounds present  Extremities: 1+ peripheral edema.  Neurologic:  Awake, alert, following commands  Skin:  No lesions  Access: None    Basic Metabolic Panel: Recent Labs  Lab 11/17/23 1113 11/18/23 1050 11/19/23 0433 11/20/23 0552 11/21/23 0530 11/22/23 0609 11/23/23 0542  NA  --    < > 141 142 139 139 141  K  --    < > 5.2* 4.7 4.0 3.8 3.6  CL  --    < > 99 100 101 98 98  CO2  --    < > 34* 32 32 32 32  GLUCOSE  --    < > 110* 124* 157* 122* 126*  BUN  --    < > 52* 55* 59* 62* 62*  CREATININE  --    < > 1.73* 1.96* 2.29* 2.21* 2.11*  CALCIUM   --    < > 9.0 8.8* 8.8* 8.7* 8.9  MG 2.5*  --  2.5* 2.7* 2.7*  --   --   PHOS 3.3  --  3.4 3.7 3.7  --   --    < > = values in this interval not displayed.   GFR: Estimated Creatinine Clearance: 34.7 mL/min (A) (by C-G formula based on SCr of 2.11 mg/dL (H)).  Liver Function Tests: No results for input(s): AST, ALT, ALKPHOS, BILITOT, PROT, ALBUMIN  in the last 168 hours. No results for input(s): LIPASE, AMYLASE in the last 168 hours. No results for input(s):  AMMONIA in the last 168 hours.  CBC: Recent Labs  Lab 11/19/23 0433 11/20/23 0552 11/21/23 0530 11/22/23 0609 11/23/23 0542  WBC 6.6 6.3 6.5 6.5 6.7  NEUTROABS 5.2  --   --   --   --   HGB 7.9* 8.0* 7.9* 7.8* 7.8*  HCT 27.2* 27.4* 26.0* 25.8* 26.7*  MCV 106.3* 106.6* 105.7* 104.0* 105.5*  PLT 125* 135* 126* 124* 134*     HbA1C: Hgb A1c MFr Bld  Date/Time Value Ref Range Status  09/04/2023 04:06 AM 5.7 (H) 4.8 - 5.6 % Final    Comment:    (NOTE) Diagnosis of Diabetes The following HbA1c ranges recommended by the American Diabetes Association (ADA) may be used as an aid in the diagnosis of diabetes mellitus.  Hemoglobin             Suggested A1C NGSP%              Diagnosis  <5.7  Non Diabetic  5.7-6.4                Pre-Diabetic  >6.4                   Diabetic  <7.0                   Glycemic control for                       adults with diabetes.    09/09/2021 04:35 AM 6.0 (H) 4.8 - 5.6 % Final    Comment:    (NOTE) Pre diabetes:          5.7%-6.4%  Diabetes:              >6.4%  Glycemic control for   <7.0% adults with diabetes     Urinalysis: No results for input(s): COLORURINE, LABSPEC, PHURINE, GLUCOSEU, HGBUR, BILIRUBINUR, KETONESUR, PROTEINUR, UROBILINOGEN, NITRITE, LEUKOCYTESUR in the last 72 hours.  Invalid input(s): APPERANCEUR    Imaging: No results found.    Medications:     allopurinol   50 mg Oral QODAY   amLODipine   10 mg Oral Daily   apixaban   5 mg Oral BID   ARIPiprazole   2 mg Oral QHS   citalopram   40 mg Oral QHS   feeding supplement  237 mL Oral BID BM   furosemide   40 mg Intravenous BID   insulin  aspart  0-15 Units Subcutaneous TID WC   insulin  aspart  0-5 Units Subcutaneous QHS   insulin  glargine-yfgn  20 Units Subcutaneous Daily   [START ON 11/24/2023] iron  polysaccharides  150 mg Oral Daily   levothyroxine   100 mcg Oral QAC breakfast   melatonin  10 mg Oral QHS    multivitamin with minerals  1 tablet Oral Daily   pantoprazole   40 mg Oral Daily   polyethylene glycol  17 g Oral Daily   rosuvastatin   10 mg Oral Daily   senna-docusate  1 tablet Oral BID    Assessment/ Plan:     68 y.o. female with a PMHx of hypertension, coronary artery disease, atrial fibrillation, congestive heart failure, DVT/PE, history of IVC placement, morbid obesity, diabetes, peripheral vascular disease, chronic kidney disease admitted with history of intentional overdose of OxyContin .  She has been on diuretics.  Now she is found to have worsening renal insufficiency and so renal evaluation is requested.  She has been on torsemide  at 40 mg twice a day.  She has been fluid and dietary noncompliant     Principal Problem:   Intractable nausea and vomiting Active Problems:   Morbid obesity with BMI of 50.0-59.9, adult (HCC)   Essential hypertension   Pulmonary HTN, Moderate  PA pressure 09/2009   LBBB (left bundle branch block)   Thrombocytopenia (HCC)   Paroxysmal atrial fibrillation (HCC)   Type 2 diabetes mellitus with stage 3b chronic kidney disease, with long-term current use of insulin  (HCC)   Hypothyroidism   (HFpEF) heart failure with preserved ejection fraction (HCC)   Drug overdose, intentional, initial encounter (HCC)   Polyuria   CKD (chronic kidney disease) stage 4, GFR 15-29 ml/min (HCC)     #1: Chronic kidney disease stage IV: Patient has chronic kidney disease most likely secondary to diabetic kidney disease complicated by decreased cardiac output during the ischemic renal disease.  Renal ultrasound showed 2.2 cm renal mass, will need eval by urology. Will continue monitoring during diuresis.    #  2: Hypertension with chronic kidney disease. Continue amlodipine  at 10 mg daily and intravenous Lasix  40 mg twice a day to manage diuresis.     #3: Diabetes mellitus type II with chronic kidney disease/renal manifestations: insulin  dependent. Home regimen  includes Novolog  and Lantus . Most recent hemoglobin A1c is 5.7 on 09/04/23.        #4: Renal mass: Patient is scheduled for CT scan and is scheduled to follow-up with urology.   #5: Anemia with chronic kidney disease: Continue iron  supplementation. Hgb 7.8      LOS: 10 Leah Harris, MD [facility] 8/18/202512:11 PM

## 2023-11-24 LAB — BASIC METABOLIC PANEL WITH GFR
Anion gap: 9 (ref 5–15)
BUN: 65 mg/dL — ABNORMAL HIGH (ref 8–23)
CO2: 32 mmol/L (ref 22–32)
Calcium: 9 mg/dL (ref 8.9–10.3)
Chloride: 97 mmol/L — ABNORMAL LOW (ref 98–111)
Creatinine, Ser: 2.31 mg/dL — ABNORMAL HIGH (ref 0.44–1.00)
GFR, Estimated: 23 mL/min — ABNORMAL LOW (ref >60)
Glucose, Bld: 169 mg/dL — ABNORMAL HIGH (ref 70–99)
Potassium: 4 mmol/L (ref 3.5–5.1)
Sodium: 138 mmol/L (ref 135–145)

## 2023-11-24 LAB — CBC
HCT: 26.2 % — ABNORMAL LOW (ref 36.0–46.0)
Hemoglobin: 7.9 g/dL — ABNORMAL LOW (ref 12.0–15.0)
MCH: 31.3 pg (ref 26.0–34.0)
MCHC: 30.2 g/dL (ref 30.0–36.0)
MCV: 104 fL — ABNORMAL HIGH (ref 80.0–100.0)
Platelets: 135 K/uL — ABNORMAL LOW (ref 150–400)
RBC: 2.52 MIL/uL — ABNORMAL LOW (ref 3.87–5.11)
RDW: 15.7 % — ABNORMAL HIGH (ref 11.5–15.5)
WBC: 6.6 K/uL (ref 4.0–10.5)
nRBC: 0 % (ref 0.0–0.2)

## 2023-11-24 LAB — GLUCOSE, CAPILLARY
Glucose-Capillary: 115 mg/dL — ABNORMAL HIGH (ref 70–99)
Glucose-Capillary: 179 mg/dL — ABNORMAL HIGH (ref 70–99)
Glucose-Capillary: 155 mg/dL — ABNORMAL HIGH (ref 70–99)
Glucose-Capillary: 169 mg/dL — ABNORMAL HIGH (ref 70–99)

## 2023-11-24 MED ORDER — CLOTRIMAZOLE-BETAMETHASONE 1-0.05 % EX CREA
TOPICAL_CREAM | Freq: Two times a day (BID) | CUTANEOUS | Status: DC
  Administered 2023-11-30: 1 via TOPICAL
  Filled 2023-11-24 (×2): qty 15

## 2023-11-24 MED ORDER — NYSTATIN 100000 UNIT/GM EX POWD
Freq: Two times a day (BID) | CUTANEOUS | Status: DC
  Filled 2023-11-24 (×3): qty 15

## 2023-11-24 NOTE — Consult Note (Addendum)
 WOC Nurse wound follow up Pouch was applied to left outer abd partial thickness weeping fissure yesterday.  It is intact with good seal, but no drainage in the pouch.   It appears the large amt weeping yellow fluid is not coming from an open wound, but the entire area of generalized skin from the left outer abd fold.    Unfortunately, this limits the options to absorb the drainage.  Skin is red, moist and macerated to fold related to moisture associated skin damage. Interdry is in place to other areas of abd folds, but this location is draining more and the Interdry cannot absorb the amt.   Topical treatment orders provided for bedside nurses to perform as follows:  Wound type:Tuck ABD pad into left outer abd fold and change PRN when wet.  Please re-consult if further assistance is needed.  Thank-you,  Stephane Fought MSN, RN, CWOCN, CWCN-AP, CNS Contact Mon-Fri 0700-1500: [phone]

## 2023-11-24 NOTE — Progress Notes (Signed)
 Progress Note    [name]  FMW:[id number] DOB: 10-25-1955  DOA: 11/11/2023 PCP: Valma Carwin, MD      Brief Narrative:    Medical records reviewed and are as summarized below:  [name] is a 67 y.o. female with pulmonary hypertension, hypertension, atrial fibrillation on Eliquis , history of DVT/PE with IVC, heart failure preserved ejection fraction, morbid obesity, CKD stage IV, chronic respiratory failure requiring 2 L nasal cannula at baseline, anxiety, insulin -dependent diabetes mellitus, who presented to the emergency department on 11/02/2023 because of intentional overdose of OxyContin  on 11/01/2023.  She was admitted to the behavioral health unit on 11/06/2023 for further management. Hospitalist team was consulted on 11/21/2023 because of concerns of intractable nausea and vomiting.  She also complained of constipation for about 2 weeks.  Patient was also found to have weeping edema from the abdomen and weeping edema from left leg with bloody drainage soaking through left Unna boot.     Assessment/Plan:   Principal Problem:   Intractable nausea and vomiting Active Problems:   Polyuria   Essential hypertension   Paroxysmal atrial fibrillation (HCC)   Type 2 diabetes mellitus with stage 3b chronic kidney disease, with long-term current use of insulin  (HCC)   (HFpEF) heart failure with preserved ejection fraction (HCC)   Morbid obesity with BMI of 50.0-59.9, adult (HCC)   LBBB (left bundle branch block)   Hypothyroidism   Pulmonary HTN, Moderate  PA pressure 09/2009   Thrombocytopenia (HCC)   Drug overdose, intentional, initial encounter (HCC)   CKD (chronic kidney disease) stage 4, GFR 15-29 ml/min (HCC)   # Acute on Chronic HFpEF, anasarca (ascites, peripheral edema)-  S/p IV Lasix . Held pm dose on 8/14   Monitor BMP, daily weight and urine output. Torsemide  on hold. Recent echo noted EF 60-65%, moderate LVH, normal RV -Recent R/LHC noted no  significant CAD, PA pressure 62/16, wedge of 11, PVR 4.4, group 3 pulmonary hypertension suspected 8/14 creatinine 1.73 slightly elevated, held Lasix  for now.  Monitor renal function daily and diurese accordingly. 8/17 sCr 1.96>>2.21 elevated 8/16 resumed Lasix  40 mg IV twice daily after discussion with nephrology.    # CKD (chronic kidney disease) stage 4, GFR 15-29 ml/min (HCC) Baseline creatinine around 1.6-2  sCr 1.73>>2.21>>2.31 --Monitor BMP with diuresis    Chronic Lymphedema -- currently with increased, weeping edema Venous stasis ulcers of left lower extremity  Continue Unna boots on bilateral lower extremities. She had weeping edema (bloody drainage) from left leg.    Intractable nausea and vomiting - resolved Suspect due to severe constipation, no BM in 2 weeks. --Bowel regimen - escalate as needed --IV antiemetics as needed --Tolerating PO intake, no need for IV fluids --Monitor BMP 8/13 started PPI 40 mg IV twice daily followed by 40 mg p.o. daily for possible acid reflux/GERD 8/19 as per patient ferrous sulfate  was causing nausea, changed to Niferex and now she is feeling better.   # UTI, Urine culture from 05/05/2023: Showed Enterobacter cloacae, pansensitive UA with only trace leukocytes, rare bacteria, 0-5 WBC's -- not consistent with UTI. Prior to admission patient was started on Keflex  so completed course for total 5 days.   Type 2 diabetes mellitus with long-term current use of insulin  (HCC) Continue Semglee  and NovoLog  as needed   Paroxysmal atrial fibrillation, bradycardia Continue Eliquis  HR in the 50s not on rate control drugs  # Hyperkalemia, Lokelma  given on 8/14.  Resolved Check BMP daily   Drug  overdose, intentional, initial encounter (HCC) Major depressive disorder, anxiety Continue Celexa  and Abilify  Pt was initially admitted to medical floor from geropsych unit. One-on-one suicide precautions were lifted per psychiatry on 11/12/23. Pt can  be discharged when medically stable, no longer requires psych admission.  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 daily from 8/15 Follow-up with PCP to repeat TSH level after 6 weeks.   Anemia of chronic disease Iron  profile, B12 within normal range Monitor H&H  Generalized weakness and fatigue, vitamin D  within normal range  Intertrigo, abdominal folds 8/19 started Lotrisone  cream twice daily followed by nystatin  powder Applied Lotrisone  cream on bilateral lower extremities well for possible dermatitis associated with chronic venous stasis/lymphedema Continue Lotrisone  for at least 2-4 weeks  Body mass index is 58.73 kg/m.  (Morbid obesity)  Diet Order             Diet Carb Modified Fluid consistency: Thin; Room service appropriate? Yes  Diet effective now                   Consultants: Psychiatrist Palliative care Vascular surgeon  Procedures: None   Medications:    allopurinol   50 mg Oral QODAY   amLODipine   10 mg Oral Daily   apixaban   5 mg Oral BID   ARIPiprazole   2 mg Oral QHS   citalopram   40 mg Oral QHS   clotrimazole -betamethasone    Topical BID   feeding supplement  237 mL Oral BID BM   furosemide   40 mg Intravenous BID   insulin  aspart  0-15 Units Subcutaneous TID WC   insulin  aspart  0-5 Units Subcutaneous QHS   insulin  glargine-yfgn  20 Units Subcutaneous Daily   iron  polysaccharides  150 mg Oral Daily   levothyroxine   100 mcg Oral QAC breakfast   melatonin  10 mg Oral QHS   multivitamin with minerals  1 tablet Oral Daily   nystatin    Topical BID   pantoprazole   40 mg Oral Daily   polyethylene glycol  17 g Oral Daily   rosuvastatin   10 mg Oral  Daily   senna-docusate  1 tablet Oral BID   Continuous Infusions:   Anti-infectives (From admission, onward)    None        Family Communication/Anticipated D/C date and plan/Code Status   DVT prophylaxis: Place TED hose Start: 11/11/23 1406 apixaban  (ELIQUIS ) tablet 5 mg     Code Status: Limited: Do not attempt resuscitation (DNR) -DNR-LIMITED -Do Not Intubate/DNI   Family Communication: None Disposition Plan: Plan to discharge to SNF  8/19 awaiting for SNF placement, IV Lasix  can be switched to torsemide .  Medically stable to be discharged whenever bed will be available.  Status is: Inpatient Remains inpatient appropriate because: Fluid overload    Subjective:   No significant events overnight.  Patient denies any nausea vomiting today, feels improvement after discontinuing ferrous sulfate .  Tolerated Niferex well.  Still has significant fluid drainage from abdominal folds. Lotrisone  and nystatin  order placed as above Patient was resting comfortably on the recliner, denied any other active issues   Objective:    Vitals:   11/23/23 2018 11/24/23 0500 11/24/23 0507 11/24/23 0758  BP: (!) 141/49  ROLLEN)  130/51 (!) 137/48  Pulse: (!) 50  (!) 50 (!) 49  Resp: 20  20 20   Temp: 98.9 F (37.2 C)   98.6 F (37 C)  TempSrc: Oral   Oral  SpO2: 99%  100% 100%  Weight:  (!) 138.9 kg    Height:       No data found.   Intake/Output Summary (Last 24 hours) at 11/24/2023 1421 Last data filed at 11/24/2023 0900 Gross per 24 hour  Intake 600 ml  Output --  Net 600 ml   Filed Weights   11/22/23 0406 11/23/23 0438 11/24/23 0500  Weight: (!) 140.2 kg (!) 140.9 kg (!) 138.9 kg    Exam:   GEN: NAD SKIN: Warm and dry EYES: No pallor or icterus ENT: MMM CV: RRR PULM: Equal air entry bilaterally, mild bilateral crackles, no wheezes ABD: soft, soft, distended with fluid, NT, +BS CNS: AAO x 3, non focal EXT: 3-4+ edema, Unna boots intact, most of the edema is in her  thighs and abdomen/pannus  No tenderness,    Data Reviewed:   I have personally reviewed following labs and imaging studies:  Labs: Labs show the following:   Basic Metabolic Panel: Recent Labs  Lab 11/19/23 0433 11/20/23 0552 11/21/23 0530 11/22/23 0609 11/23/23 0542 11/24/23 0353  NA 141 142 139 139 141 138  K 5.2* 4.7 4.0 3.8 3.6 4.0  CL 99 100 101 98 98 97*  CO2 34* 32 32 32 32 32  GLUCOSE 110* 124* 157* 122* 126* 169*  BUN 52* 55* 59* 62* 62* 65*  CREATININE 1.73* 1.96* 2.29* 2.21* 2.11* 2.31*  CALCIUM  9.0 8.8* 8.8* 8.7* 8.9 9.0  MG 2.5* 2.7* 2.7*  --   --   --   PHOS 3.4 3.7 3.7  --   --   --    GFR Estimated Creatinine Clearance: 31.4 mL/min (A) (by C-G formula based on SCr of 2.31 mg/dL (H)). Liver Function Tests: No results for input(s): AST, ALT, ALKPHOS, BILITOT, PROT, ALBUMIN  in the last 168 hours.  No results for input(s): LIPASE, AMYLASE in the last 168 hours. No results for input(s): AMMONIA in the last 168 hours. Coagulation profile No results for input(s): INR, PROTIME in the last 168 hours.  CBC: Recent Labs  Lab 11/19/23 0433 11/20/23 0552 11/21/23 0530 11/22/23 0609 11/23/23 0542 11/24/23 0353  WBC 6.6 6.3 6.5 6.5 6.7 6.6  NEUTROABS 5.2  --   --   --   --   --   HGB 7.9* 8.0* 7.9* 7.8* 7.8* 7.9*  HCT 27.2* 27.4* 26.0* 25.8* 26.7* 26.2*  MCV 106.3* 106.6* 105.7* 104.0* 105.5* 104.0*  PLT 125* 135* 126* 124* 134* 135*   Cardiac Enzymes: No results for input(s): CKTOTAL, CKMB, CKMBINDEX, TROPONINI in the last 168 hours. BNP (last 3 results) No results for input(s): PROBNP in the last 8760 hours. CBG: Recent Labs  Lab 11/23/23 1208 11/23/23 1621 11/23/23 2058 11/24/23 0759 11/24/23 1137  GLUCAP 147* 199* 165* 115* 179*   D-Dimer: No results for input(s): DDIMER in the last 72 hours. Hgb A1c: No results for input(s): HGBA1C in the last 72 hours. Lipid Profile: No results for input(s):  CHOL, HDL, LDLCALC, TRIG, CHOLHDL, LDLDIRECT in the last 72 hours. Thyroid  function studies: No results for input(s): TSH, T4TOTAL, T3FREE, THYROIDAB in the last 72 hours.  Invalid input(s): FREET3 Anemia work up: No results for input(s): VITAMINB12, FOLATE, FERRITIN, TIBC, IRON , RETICCTPCT in the last 72 hours.  Sepsis Labs:  Recent Labs  Lab 11/21/23 0530 11/22/23 0609 11/23/23 0542 11/24/23 0353  WBC 6.5 6.5 6.7 6.6    Microbiology No results found for this or any previous visit (from the past 240 hours).  Procedures and diagnostic studies:  No results found.     LOS: 11 days   Renato Spellman [employer]   Pager on [email or web address]. If 7PM-7AM, please contact night-coverage at [email or web address]     11/24/2023, 2:21 PM

## 2023-11-24 NOTE — Plan of Care (Signed)

## 2023-11-24 NOTE — Plan of Care (Signed)
   Problem: Education: Goal: Knowledge of General Education information will improve Description: Including pain rating scale, medication(s)/side effects and non-pharmacologic comfort measures Outcome: Progressing   Problem: Activity: Goal: Risk for activity intolerance will decrease Outcome: Progressing

## 2023-11-24 NOTE — Progress Notes (Signed)
 [facility]  PROGRESS NOTE   Subjective:   Patient sitting up in bed Alert and oriented Remains on room air   Creatinine 2.31  Objective:  Vital signs: Blood pressure (!) 137/48, pulse (!) 49, temperature 98.6 F (37 C), temperature source Oral, resp. rate 20, height 5' 1 (1.549 m), weight (!) 138.9 kg, SpO2 100%.  Intake/Output Summary (Last 24 hours) at 11/24/2023 1211 Last data filed at 11/24/2023 0900 Gross per 24 hour  Intake 840 ml  Output --  Net 840 ml   Filed Weights   11/22/23 0406 11/23/23 0438 11/24/23 0500  Weight: (!) 140.2 kg (!) 140.9 kg (!) 138.9 kg     Physical Exam: General:  No acute distress  Head:  Normocephalic, atraumatic. Moist oral mucosal membranes  Eyes:  Anicteric  Lungs:   Clear to auscultation, normal effort  Heart:  S1S2 no rubs  Abdomen:   Soft, nontender, bowel sounds present  Extremities: 1+ peripheral edema.  Neurologic:  Awake, alert, following commands  Skin:  No lesions  Access: None    Basic Metabolic Panel: Recent Labs  Lab 11/19/23 0433 11/20/23 0552 11/21/23 0530 11/22/23 0609 11/23/23 0542 11/24/23 0353  NA 141 142 139 139 141 138  K 5.2* 4.7 4.0 3.8 3.6 4.0  CL 99 100 101 98 98 97*  CO2 34* 32 32 32 32 32  GLUCOSE 110* 124* 157* 122* 126* 169*  BUN 52* 55* 59* 62* 62* 65*  CREATININE 1.73* 1.96* 2.29* 2.21* 2.11* 2.31*  CALCIUM  9.0 8.8* 8.8* 8.7* 8.9 9.0  MG 2.5* 2.7* 2.7*  --   --   --   PHOS 3.4 3.7 3.7  --   --   --    GFR: Estimated Creatinine Clearance: 31.4 mL/min (A) (by C-G formula based on SCr of 2.31 mg/dL (H)).  Liver Function Tests: No results for input(s): AST, ALT, ALKPHOS, BILITOT, PROT, ALBUMIN  in the last 168 hours. No results for input(s): LIPASE, AMYLASE in the last 168 hours. No results for input(s): AMMONIA in the last 168 hours.  CBC: Recent Labs  Lab 11/19/23 0433 11/20/23 0552 11/21/23 0530 11/22/23 0609 11/23/23 0542 11/24/23 0353  WBC  6.6 6.3 6.5 6.5 6.7 6.6  NEUTROABS 5.2  --   --   --   --   --   HGB 7.9* 8.0* 7.9* 7.8* 7.8* 7.9*  HCT 27.2* 27.4* 26.0* 25.8* 26.7* 26.2*  MCV 106.3* 106.6* 105.7* 104.0* 105.5* 104.0*  PLT 125* 135* 126* 124* 134* 135*     HbA1C: Hgb A1c MFr Bld  Date/Time Value Ref Range Status  09/04/2023 04:06 AM 5.7 (H) 4.8 - 5.6 % Final    Comment:    (NOTE) Diagnosis of Diabetes The following HbA1c ranges recommended by the American Diabetes Association (ADA) may be used as an aid in the diagnosis of diabetes mellitus.  Hemoglobin             Suggested A1C NGSP%              Diagnosis  <5.7                   Non Diabetic  5.7-6.4                Pre-Diabetic  >6.4                   Diabetic  <7.0  Glycemic control for                       adults with diabetes.    09/09/2021 04:35 AM 6.0 (H) 4.8 - 5.6 % Final    Comment:    (NOTE) Pre diabetes:          5.7%-6.4%  Diabetes:              >6.4%  Glycemic control for   <7.0% adults with diabetes     Urinalysis: No results for input(s): COLORURINE, LABSPEC, PHURINE, GLUCOSEU, HGBUR, BILIRUBINUR, KETONESUR, PROTEINUR, UROBILINOGEN, NITRITE, LEUKOCYTESUR in the last 72 hours.  Invalid input(s): APPERANCEUR    Imaging: No results found.    Medications:     allopurinol   50 mg Oral QODAY   amLODipine   10 mg Oral Daily   apixaban   5 mg Oral BID   ARIPiprazole   2 mg Oral QHS   citalopram   40 mg Oral QHS   feeding supplement  237 mL Oral BID BM   furosemide   40 mg Intravenous BID   insulin  aspart  0-15 Units Subcutaneous TID WC   insulin  aspart  0-5 Units Subcutaneous QHS   insulin  glargine-yfgn  20 Units Subcutaneous Daily   iron  polysaccharides  150 mg Oral Daily   levothyroxine   100 mcg Oral QAC breakfast   melatonin  10 mg Oral QHS   multivitamin with minerals  1 tablet Oral Daily   pantoprazole   40 mg Oral Daily   polyethylene glycol  17 g Oral Daily   rosuvastatin    10 mg Oral Daily   senna-docusate  1 tablet Oral BID    Assessment/ Plan:     68 y.o. female with a PMHx of hypertension, coronary artery disease, atrial fibrillation, congestive heart failure, DVT/PE, history of IVC placement, morbid obesity, diabetes, peripheral vascular disease, chronic kidney disease admitted with history of intentional overdose of OxyContin .  She has been on diuretics.  Now she is found to have worsening renal insufficiency and so renal evaluation is requested.  She has been on torsemide  at 40 mg twice a day.  She has been fluid and dietary noncompliant     Principal Problem:   Intractable nausea and vomiting Active Problems:   Morbid obesity with BMI of 50.0-59.9, adult (HCC)   Essential hypertension   Pulmonary HTN, Moderate  PA pressure 09/2009   LBBB (left bundle branch block)   Thrombocytopenia (HCC)   Paroxysmal atrial fibrillation (HCC)   Type 2 diabetes mellitus with stage 3b chronic kidney disease, with long-term current use of insulin  (HCC)   Hypothyroidism   (HFpEF) heart failure with preserved ejection fraction (HCC)   Drug overdose, intentional, initial encounter (HCC)   Polyuria   CKD (chronic kidney disease) stage 4, GFR 15-29 ml/min (HCC)     #1: Chronic kidney disease stage IV: Patient has chronic kidney disease most likely secondary to diabetic kidney disease complicated by decreased cardiac output during the ischemic renal disease.  Renal ultrasound showed 2.2 cm renal mass, was awaiting outpatient urology follow up.  Renal function remains stable. No urine output recorded, but daily weights have decreased. Will continue to monitor renal function during diuresis.    #2: Hypertension with chronic kidney disease. Continue amlodipine  at 10 mg daily and intravenous Lasix  40 mg twice a day to manage diuresis.  Blood pressure stable   #3: Diabetes mellitus type II with chronic kidney disease/renal manifestations: insulin  dependent. Home regimen  includes  Novolog  and Lantus . Most recent hemoglobin A1c is 5.7 on 09/04/23.      Primary team will manage SSI   #4: Renal mass: Patient is scheduled for CT scan and is scheduled to follow-up with urology.   #5: Anemia with chronic kidney disease: Continue iron  supplementation. Hgb 7.8      LOS: 11 Faith Harris, MD [facility] 8/19/202512:11 PM

## 2023-11-25 LAB — BASIC METABOLIC PANEL WITH GFR
Anion gap: 10 (ref 5–15)
BUN: 64 mg/dL — ABNORMAL HIGH (ref 8–23)
CO2: 32 mmol/L (ref 22–32)
Calcium: 9 mg/dL (ref 8.9–10.3)
Chloride: 100 mmol/L (ref 98–111)
Creatinine, Ser: 2.34 mg/dL — ABNORMAL HIGH (ref 0.44–1.00)
GFR, Estimated: 22 mL/min — ABNORMAL LOW (ref >60)
Glucose, Bld: 130 mg/dL — ABNORMAL HIGH (ref 70–99)
Potassium: 3.7 mmol/L (ref 3.5–5.1)
Sodium: 142 mmol/L (ref 135–145)

## 2023-11-25 LAB — CBC
HCT: 26.9 % — ABNORMAL LOW (ref 36.0–46.0)
Hemoglobin: 7.9 g/dL — ABNORMAL LOW (ref 12.0–15.0)
MCH: 30.9 pg (ref 26.0–34.0)
MCHC: 29.4 g/dL — ABNORMAL LOW (ref 30.0–36.0)
MCV: 105.1 fL — ABNORMAL HIGH (ref 80.0–100.0)
Platelets: 131 K/uL — ABNORMAL LOW (ref 150–400)
RBC: 2.56 MIL/uL — ABNORMAL LOW (ref 3.87–5.11)
RDW: 15.7 % — ABNORMAL HIGH (ref 11.5–15.5)
WBC: 6.4 K/uL (ref 4.0–10.5)
nRBC: 0 % (ref 0.0–0.2)

## 2023-11-25 LAB — GLUCOSE, CAPILLARY
Glucose-Capillary: 120 mg/dL — ABNORMAL HIGH (ref 70–99)
Glucose-Capillary: 181 mg/dL — ABNORMAL HIGH (ref 70–99)
Glucose-Capillary: 213 mg/dL — ABNORMAL HIGH (ref 70–99)
Glucose-Capillary: 195 mg/dL — ABNORMAL HIGH (ref 70–99)

## 2023-11-25 MED ORDER — LORATADINE 10 MG PO TABS
10.0000 mg | ORAL_TABLET | Freq: Every day | ORAL | Status: DC | PRN
  Administered 2023-11-25: 10 mg via ORAL
  Filled 2023-11-25: qty 1

## 2023-11-25 MED ORDER — TORSEMIDE 20 MG PO TABS
40.0000 mg | ORAL_TABLET | Freq: Two times a day (BID) | ORAL | Status: DC
  Administered 2023-11-25 – 2023-12-01 (×12): 40 mg via ORAL
  Filled 2023-11-25 (×13): qty 2

## 2023-11-25 NOTE — Plan of Care (Signed)

## 2023-11-25 NOTE — Progress Notes (Signed)
 Physical Therapy Treatment Patient Details Name: [name] MRN: [id number] DOB: May 02, 1955 Today's Date: 11/25/2023   History of Present Illness AKSHARA BLUMENTHAL is a 67yoF 11/02/2023 after intentional ingestion of oxycodone  c SI. PMH: MDD, GAD,HTN, AF, DM2, DVT/PE status post IVC filter, HFpEF, single-chamber PPM and chronic pain. Pt recently started on home O2.    PT Comments  Pt declining OOB mobility due to LE pain, weeping, and bleeding; BLE covered in mepilex, no strikethrough noted. She is agreeable for supine therex with encouragement. Treatment focused on LE strengthening exercises and endurance, therefore, no rest breaks were taken between sets/reps of therex.  Pt completes x10 reps of BLE therex with full AROM, slow eccentric lowering, and no rest breaks. Attempted bridging this date with BLE supported in hooklying position, however, unable secondary to c/o chest pain. Endorses appropriate level of increased work of breathing and elevated HR after supine therex, indicating adequate CVP challenge. Pt will continue to benefit from skilled acute PT services to address deficits for return to baseline function. Will continue per POC.  Encourage OOB mobility with nursing and mobility tech for meals and toileting for continued progress towards goals and maintenance of IND with functional mobility while hospitalized.    If plan is discharge home, recommend the following: A little help with walking and/or transfers;A lot of help with bathing/dressing/bathroom;Assistance with cooking/housework;Assist for transportation;Help with stairs or ramp for entrance   Can travel by private vehicle        Equipment Recommendations  None recommended by PT    Recommendations for Other Services       Precautions / Restrictions Precautions Precautions: Fall Recall of Precautions/Restrictions: Intact Restrictions Weight Bearing Restrictions Per Provider Order: No Other Position/Activity  Restrictions: Strict I/O, SpO2 >/= 92%     Mobility  Bed Mobility               General bed mobility comments: Pt declined OOB mobility (pt reports she has been getting up to recliner every day with nursing, if not therapy, for a couple of hours)        Balance  Pt declining OOB mobility                                          Communication Communication Communication: No apparent difficulties  Cognition Arousal: Alert Behavior During Therapy: WFL for tasks assessed/performed   PT - Cognitive impairments: No apparent impairments                         Following commands: Intact      Cueing Cueing Techniques: Verbal cues  Exercises General Exercises - Lower Extremity Ankle Circles/Pumps: AROM, Strengthening, Both, 10 reps, Supine Short Arc Quad: AROM, Strengthening, Both, 10 reps, Supine Heel Slides: AROM, Strengthening, Both, 10 reps, Supine Hip ABduction/ADduction: AROM, Strengthening, Both, 10 reps, Supine Straight Leg Raises: AROM, Strengthening, Both, 10 reps, Supine Hip Flexion/Marching: AROM, Strengthening, Both, 10 reps, Supine Other Exercises Other Exercises: Edu re: importance of OOB mobility for maintenance of IND, LE exercises outside of therapy, pursed lip breathing        Pertinent Vitals/Pain Pain Assessment Pain Assessment: 0-10 Pain Score: 2  Pain Location: BLE Pain Descriptors / Indicators: Aching, Dull Pain Intervention(s): Monitored during session     PT Goals (current goals can now be found in the care plan section)  Acute Rehab PT Goals Patient Stated Goal: transition to LTC or progress function for safer independence PT Goal Formulation: With patient Time For Goal Achievement: 11/27/23 Potential to Achieve Goals: Fair Progress towards PT goals: Progressing toward goals    Frequency    Min 2X/week       AM-PAC PT 6 Clicks Mobility   Outcome Measure  Help needed turning from your back to  your side while in a flat bed without using bedrails?: A Little Help needed moving from lying on your back to sitting on the side of a flat bed without using bedrails?: A Lot Help needed moving to and from a bed to a chair (including a wheelchair)?: A Little Help needed standing up from a chair using your arms (e.g., wheelchair or bedside chair)?: A Little Help needed to walk in hospital room?: A Little Help needed climbing 3-5 steps with a railing? : A Lot 6 Click Score: 16    End of Session Equipment Utilized During Treatment: Oxygen (2L) Activity Tolerance: Patient tolerated treatment well Patient left: in bed;with call bell/phone within reach;with bed alarm set Nurse Communication: Mobility status PT Visit Diagnosis: Difficulty in walking, not elsewhere classified (R26.2);Other abnormalities of gait and mobility (R26.89);Muscle weakness (generalized) (M62.81)     Time: 8784-8767 PT Time Calculation (min) (ACUTE ONLY): 17 min  Charges:    $Therapeutic Exercise: 8-22 mins PT General Charges $$ ACUTE PT VISIT: 1 Visit                      Camie CHARLENA Kluver, PT, DPT 3:20 PM,11/25/23 Physical Therapist - [facility]

## 2023-11-25 NOTE — Progress Notes (Signed)
 Progress Note    [name]  FMW:[id number] DOB: [birth year]  DOA: 11/11/2023 PCP: Valma Carwin, MD      Brief Narrative:    Medical records reviewed and are as summarized below:  [name] is a 68 y.o. female with pulmonary hypertension, hypertension, atrial fibrillation on Eliquis , history of DVT/PE with IVC, heart failure preserved ejection fraction, morbid obesity, CKD stage IV, chronic respiratory failure requiring 2 L nasal cannula at baseline, anxiety, insulin -dependent diabetes mellitus, who presented to the emergency department on 11/02/2023 because of intentional overdose of OxyContin  on 11/01/2023.  She was admitted to the behavioral health unit on 11/06/2023 for further management. Hospitalist team was consulted on 11/21/2023 because of concerns of intractable nausea and vomiting.  She also complained of constipation for about 2 weeks.  Patient was also found to have weeping edema from the abdomen and weeping edema from left leg with bloody drainage soaking through left Unna boot.     Assessment/Plan:   Principal Problem:   Intractable nausea and vomiting Active Problems:   Polyuria   Essential hypertension   Paroxysmal atrial fibrillation (HCC)   Type 2 diabetes mellitus with stage 3b chronic kidney disease, with long-term current use of insulin  (HCC)   (HFpEF) heart failure with preserved ejection fraction (HCC)   Morbid obesity with BMI of 50.0-59.9, adult (HCC)   LBBB (left bundle branch block)   Hypothyroidism   Pulmonary HTN, Moderate  PA pressure 09/2009   Thrombocytopenia (HCC)   Drug overdose, intentional, initial encounter (HCC)   CKD (chronic kidney disease) stage 4, GFR 15-29 ml/min (HCC)   # Acute on Chronic HFpEF, anasarca (ascites, peripheral edema)-  S/p IV Lasix . Held pm dose on 8/14   Monitor BMP, daily weight and urine output. Torsemide  on hold but restarted  Recent echo noted EF 60-65%, moderate LVH, normal RV -Recent R/LHC  noted no significant CAD, PA pressure 62/16, wedge of 11, PVR 4.4, group 3 pulmonary hypertension suspected Monitor renal function daily and diurese accordingly. 8/17 sCr 1.96>>2.21 elevated 8/16 resumed Lasix  40 mg IV twice daily after discussion with nephrology. 8/20 restart home torsemide     # CKD (chronic kidney disease) stage 4, GFR 15-29 ml/min (HCC) Baseline creatinine around 1.6-2  sCr 1.73>>2.21>>2.31 --Monitor BMP with diuresis    Chronic Lymphedema -- currently with increased, weeping edema Venous stasis ulcers of left lower extremity  Continue Unna boots on bilateral lower extremities. She had weeping edema (bloody drainage) from left leg.    Intractable nausea and vomiting - resolved Suspect due to severe constipation, no BM in 2 weeks. --Bowel regimen - escalate as needed --IV antiemetics as needed --Tolerating PO intake, no need for IV fluids --Monitor BMP 8/13 started PPI 40 mg IV twice daily followed by 40 mg p.o. daily for possible acid reflux/GERD 8/19 as per patient ferrous sulfate  was causing nausea, changed to Niferex and now she is feeling better.   # UTI, Urine culture from 05/05/2023: Showed Enterobacter cloacae, pansensitive UA with only trace leukocytes, rare bacteria, 0-5 WBC's -- not consistent with UTI. Prior to admission patient was started on Keflex  so completed course for total 5 days.   Type 2 diabetes mellitus with long-term current use of insulin  (HCC) Continue Semglee  and NovoLog  as needed   Paroxysmal atrial fibrillation, bradycardia Continue Eliquis  HR in the 50s not on rate control drugs  # Hyperkalemia, Lokelma  given on 8/14.  Resolved Check BMP daily   Drug overdose, intentional, initial  encounter [facility]) Major depressive disorder, anxiety Continue Celexa  and Abilify  Pt was initially admitted to medical floor from geropsych unit. One-on-one suicide precautions were lifted per psychiatry on 11/12/23. Pt can be discharged when  medically stable, no longer requires psych admission.  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 daily from 8/15 Follow-up with PCP to repeat TSH level after 6 weeks.   Anemia of chronic disease Iron  profile, B12 within normal range Monitor H&H  Generalized weakness and fatigue, vitamin D  within normal range  Intertrigo, abdominal folds 8/19 started Lotrisone  cream twice daily followed by nystatin  powder Applied Lotrisone  cream on bilateral lower extremities well for possible dermatitis associated with chronic venous stasis/lymphedema Continue Lotrisone  for at least 2-4 weeks  Body mass index is 58.73 kg/m.  (Morbid obesity)  Diet Order             Diet Carb Modified Fluid consistency: Thin; Room service appropriate? Yes  Diet effective now                   Consultants: Psychiatrist Palliative care Vascular surgeon  Procedures: None   Medications:    allopurinol   50 mg Oral QODAY   amLODipine   10 mg Oral Daily   apixaban   5 mg Oral BID   ARIPiprazole   2 mg Oral QHS   citalopram   40 mg Oral QHS   clotrimazole -betamethasone    Topical BID   feeding supplement  237 mL Oral BID BM   insulin  aspart  0-15 Units Subcutaneous TID WC   insulin  aspart  0-5 Units Subcutaneous QHS   insulin  glargine-yfgn  20 Units Subcutaneous Daily   iron  polysaccharides  150 mg Oral Daily   levothyroxine   100 mcg Oral QAC breakfast   melatonin  10 mg Oral QHS   multivitamin with minerals  1 tablet Oral Daily   nystatin    Topical BID   pantoprazole   40 mg Oral Daily   polyethylene glycol  17 g Oral Daily   rosuvastatin   10 mg Oral Daily   senna-docusate  1 tablet Oral BID   torsemide    40 mg Oral BID   Continuous Infusions:   Anti-infectives (From admission, onward)    None        Family Communication/Anticipated D/C date and plan/Code Status   DVT prophylaxis: Place TED hose Start: 11/11/23 1406 apixaban  (ELIQUIS ) tablet 5 mg     Code Status: Limited: Do not attempt resuscitation (DNR) -DNR-LIMITED -Do Not Intubate/DNI   Family Communication: None Disposition Plan: Plan to discharge to [facility]  8/19 awaiting for SNF placement, IV Lasix  can be switched to torsemide .  Medically stable to be discharged whenever bed will be available.  Status is: Inpatient Remains inpatient appropriate because: Fluid overload    Subjective:   No significant events overnight.   Reports concern for increased lower extremity swelling w/ removal of unna boots  Patient denies any nausea vomiting today,    Patient was resting comfortably on the bed, denied any other active issues   Objective:    Vitals:   11/25/23 0423 11/25/23 0500 11/25/23 0758 11/25/23 1609  BP: (!) 138/52  (!) 154/58 (!) 136/34  Pulse: (!) 50  (!) 50  Resp: 16  19 20   Temp: 98.5 F (36.9 C)  98.3 F (36.8 C) 98 F (36.7 C)  TempSrc: Oral     SpO2: 98%  100% 98%  Weight:  (!) 141 kg    Height:       No data found.   Intake/Output Summary (Last 24 hours) at 11/25/2023 1713 Last data filed at 11/25/2023 1300 Gross per 24 hour  Intake 600 ml  Output --  Net 600 ml   Filed Weights   11/23/23 0438 11/24/23 0500 11/25/23 0500  Weight: (!) 140.9 kg (!) 138.9 kg (!) 141 kg    Exam:   GEN: NAD SKIN: Warm and dry EYES: No pallor or icterus ENT: MMM CV: RRR PULM: Equal air entry bilaterally, mild bilateral crackles, no wheezes ABD: soft, soft, distended with fluid, NT, +BS CNS: AAO x 3, non focal EXT: 3-4+ edema, Unna boots intact, most of the edema is in her thighs and abdomen/pannus  No tenderness,    Data Reviewed:   I have personally reviewed following labs and imaging  studies:  Labs: Labs show the following:   Basic Metabolic Panel: Recent Labs  Lab 11/19/23 0433 11/20/23 0552 11/21/23 0530 11/22/23 0609 11/23/23 0542 11/24/23 0353 11/25/23 0630  NA 141 142 139 139 141 138 142  K 5.2* 4.7 4.0 3.8 3.6 4.0 3.7  CL 99 100 101 98 98 97* 100  CO2 34* 32 32 32 32 32 32  GLUCOSE 110* 124* 157* 122* 126* 169* 130*  BUN 52* 55* 59* 62* 62* 65* 64*  CREATININE 1.73* 1.96* 2.29* 2.21* 2.11* 2.31* 2.34*  CALCIUM  9.0 8.8* 8.8* 8.7* 8.9 9.0 9.0  MG 2.5* 2.7* 2.7*  --   --   --   --   PHOS 3.4 3.7 3.7  --   --   --   --    GFR Estimated Creatinine Clearance: 31.3 mL/min (A) (by C-G formula based on SCr of 2.34 mg/dL (H)). Liver Function Tests: No results for input(s): AST, ALT, ALKPHOS, BILITOT, PROT, ALBUMIN  in the last 168 hours.  No results for input(s): LIPASE, AMYLASE in the last 168 hours. No results for input(s): AMMONIA in the last 168 hours. Coagulation profile No results for input(s): INR, PROTIME in the last 168 hours.  CBC: Recent Labs  Lab 11/19/23 0433 11/20/23 0552 11/21/23 0530 11/22/23 0609 11/23/23 0542 11/24/23 0353 11/25/23 0630  WBC 6.6   < > 6.5 6.5 6.7 6.6 6.4  NEUTROABS 5.2  --   --   --   --   --   --   HGB 7.9*   < > 7.9* 7.8* 7.8* 7.9* 7.9*  HCT 27.2*   < > 26.0* 25.8* 26.7* 26.2* 26.9*  MCV 106.3*   < > 105.7* 104.0* 105.5* 104.0* 105.1*  PLT 125*   < > 126* 124* 134* 135* 131*   < > = values in this interval not displayed.   Cardiac Enzymes: No results for input(s): CKTOTAL, CKMB, CKMBINDEX, TROPONINI in the last 168 hours. BNP (last 3 results) No results for input(s): PROBNP in the last 8760 hours. CBG: Recent Labs  Lab 11/24/23 1539 11/24/23 2052 11/25/23 0756 11/25/23 1127 11/25/23 1604  GLUCAP 155* 169* 120* 181* 213*   D-Dimer: No results for input(s): DDIMER in the last 72 hours. Hgb A1c: No results for input(s): HGBA1C in the last 72 hours. Lipid  Profile: No results for input(s): CHOL, HDL, LDLCALC, TRIG, CHOLHDL, LDLDIRECT in the last 72  hours. Thyroid  function studies: No results for input(s): TSH, T4TOTAL, T3FREE, THYROIDAB in the last 72 hours.  Invalid input(s): FREET3 Anemia work up: No results for input(s): VITAMINB12, FOLATE, FERRITIN, TIBC, IRON , RETICCTPCT in the last 72 hours.  Sepsis Labs: Recent Labs  Lab 11/22/23 0609 11/23/23 0542 11/24/23 0353 11/25/23 0630  WBC 6.5 6.7 6.6 6.4    Microbiology No results found for this or any previous visit (from the past 240 hours).  Procedures and diagnostic studies:  No results found.     LOS: 12 days   Geoffery Aultman  [facility]   Pager on [email or web address]. If 7PM-7AM, please contact night-coverage at [email or web address]     11/25/2023, 5:13 PM

## 2023-11-25 NOTE — Progress Notes (Signed)
 [facility]  PROGRESS NOTE   Subjective:   Patient seen laying in bed Remains on 3L [place] Tolerating meals   Creatinine 2.34  Objective:  Vital signs: Blood pressure (!) 154/58, pulse (!) 50, temperature 98.3 F (36.8 C), resp. rate 19, height 5' 1 (1.549 m), weight (!) 141 kg, SpO2 100%.  Intake/Output Summary (Last 24 hours) at 11/25/2023 1132 Last data filed at 11/25/2023 0900 Gross per 24 hour  Intake 600 ml  Output --  Net 600 ml   Filed Weights   11/23/23 0438 11/24/23 0500 11/25/23 0500  Weight: (!) 140.9 kg (!) 138.9 kg (!) 141 kg     Physical Exam: General:  No acute distress  Head:  Normocephalic, atraumatic. Moist oral mucosal membranes  Eyes:  Anicteric  Lungs:   Clear to auscultation, normal effort  Heart:  S1S2 no rubs  Abdomen:   Soft, nontender, bowel sounds present  Extremities: 1+ peripheral edema.  Neurologic:  Awake, alert, following commands  Skin:  No lesions  Access: None    Basic Metabolic Panel: Recent Labs  Lab 11/19/23 0433 11/20/23 0552 11/21/23 0530 11/22/23 0609 11/23/23 0542 11/24/23 0353 11/25/23 0630  NA 141 142 139 139 141 138 142  K 5.2* 4.7 4.0 3.8 3.6 4.0 3.7  CL 99 100 101 98 98 97* 100  CO2 34* 32 32 32 32 32 32  GLUCOSE 110* 124* 157* 122* 126* 169* 130*  BUN 52* 55* 59* 62* 62* 65* 64*  CREATININE 1.73* 1.96* 2.29* 2.21* 2.11* 2.31* 2.34*  CALCIUM  9.0 8.8* 8.8* 8.7* 8.9 9.0 9.0  MG 2.5* 2.7* 2.7*  --   --   --   --   PHOS 3.4 3.7 3.7  --   --   --   --    GFR: Estimated Creatinine Clearance: 31.3 mL/min (A) (by C-G formula based on SCr of 2.34 mg/dL (H)).  Liver Function Tests: No results for input(s): AST, ALT, ALKPHOS, BILITOT, PROT, ALBUMIN  in the last 168 hours. No results for input(s): LIPASE, AMYLASE in the last 168 hours. No results for input(s): AMMONIA in the last 168 hours.  CBC: Recent Labs  Lab 11/19/23 0433 11/20/23 0552 11/21/23 0530 11/22/23 0609  11/23/23 0542 11/24/23 0353 11/25/23 0630  WBC 6.6   < > 6.5 6.5 6.7 6.6 6.4  NEUTROABS 5.2  --   --   --   --   --   --   HGB 7.9*   < > 7.9* 7.8* 7.8* 7.9* 7.9*  HCT 27.2*   < > 26.0* 25.8* 26.7* 26.2* 26.9*  MCV 106.3*   < > 105.7* 104.0* 105.5* 104.0* 105.1*  PLT 125*   < > 126* 124* 134* 135* 131*   < > = values in this interval not displayed.     HbA1C: Hgb A1c MFr Bld  Date/Time Value Ref Range Status  09/04/2023 04:06 AM 5.7 (H) 4.8 - 5.6 % Final    Comment:    (NOTE) Diagnosis of Diabetes The following HbA1c ranges recommended by the American Diabetes Association (ADA) may be used as an aid in the diagnosis of diabetes mellitus.  Hemoglobin             Suggested A1C NGSP%              Diagnosis  <5.7                   Non Diabetic  5.7-6.4  Pre-Diabetic  >6.4                   Diabetic  <7.0                   Glycemic control for                       adults with diabetes.    09/09/2021 04:35 AM 6.0 (H) 4.8 - 5.6 % Final    Comment:    (NOTE) Pre diabetes:          5.7%-6.4%  Diabetes:              >6.4%  Glycemic control for   <7.0% adults with diabetes     Urinalysis: No results for input(s): COLORURINE, LABSPEC, PHURINE, GLUCOSEU, HGBUR, BILIRUBINUR, KETONESUR, PROTEINUR, UROBILINOGEN, NITRITE, LEUKOCYTESUR in the last 72 hours.  Invalid input(s): APPERANCEUR    Imaging: No results found.    Medications:     allopurinol   50 mg Oral QODAY   amLODipine   10 mg Oral Daily   apixaban   5 mg Oral BID   ARIPiprazole   2 mg Oral QHS   citalopram   40 mg Oral QHS   clotrimazole -betamethasone    Topical BID   feeding supplement  237 mL Oral BID BM   insulin  aspart  0-15 Units Subcutaneous TID WC   insulin  aspart  0-5 Units Subcutaneous QHS   insulin  glargine-yfgn  20 Units Subcutaneous Daily   iron  polysaccharides  150 mg Oral Daily   levothyroxine   100 mcg Oral QAC breakfast   melatonin  10 mg Oral QHS    multivitamin with minerals  1 tablet Oral Daily   nystatin    Topical BID   pantoprazole   40 mg Oral Daily   polyethylene glycol  17 g Oral Daily   rosuvastatin   10 mg Oral Daily   senna-docusate  1 tablet Oral BID   torsemide   40 mg Oral BID    Assessment/ Plan:     68 y.o. female with a PMHx of hypertension, coronary artery disease, atrial fibrillation, congestive heart failure, DVT/PE, history of IVC placement, morbid obesity, diabetes, peripheral vascular disease, chronic kidney disease admitted with history of intentional overdose of OxyContin .  She has been on diuretics.  Now she is found to have worsening renal insufficiency and so renal evaluation is requested.  She has been on torsemide  at 40 mg twice a day.  She has been fluid and dietary noncompliant     Principal Problem:   Intractable nausea and vomiting Active Problems:   Morbid obesity with BMI of 50.0-59.9, adult (HCC)   Essential hypertension   Pulmonary HTN, Moderate  PA pressure 09/2009   LBBB (left bundle branch block)   Thrombocytopenia (HCC)   Paroxysmal atrial fibrillation (HCC)   Type 2 diabetes mellitus with stage 3b chronic kidney disease, with long-term current use of insulin  (HCC)   Hypothyroidism   (HFpEF) heart failure with preserved ejection fraction (HCC)   Drug overdose, intentional, initial encounter (HCC)   Polyuria   CKD (chronic kidney disease) stage 4, GFR 15-29 ml/min (HCC)     #1: Chronic kidney disease stage IV: Patient has chronic kidney disease most likely secondary to diabetic kidney disease complicated by decreased cardiac output during the ischemic renal disease.  Renal ultrasound showed 2.2 cm renal mass, was awaiting outpatient urology follow up.  Renal function remains stable, Will transition to oral duretics today. Patient will need  follow up with [facility] at discharge.    #2: Hypertension with chronic kidney disease. Continue amlodipine  at 10 mg daily. Will  stop IV Furosemide  and start home dose Torsemide  40mg  twice daily.    #3: Diabetes mellitus type II with chronic kidney disease/renal manifestations: insulin  dependent. Home regimen includes Novolog  and Lantus . Most recent hemoglobin A1c is 5.7 on 09/04/23.      Primary team will manage SSI   #4: Renal mass: Patient is scheduled for CT scan and is scheduled to follow-up with urology.   #5: Anemia with chronic kidney disease: Continue iron  supplementation. Hgb 7.9      LOS: 12 Faith Harris, MD [facility] 8/20/202511:32 AM

## 2023-11-25 NOTE — Progress Notes (Incomplete)
 PROGRESS NOTE    [name]   FMW:[id number] DOB: 23-Jun-1955  DOA: 11/11/2023 Date of Service: 11/25/23 which is hospital day 12  PCP: Valma Carwin, MD    Hospital course / significant events:   HPI: [name] is a 68 year old female with pulmonary hypertension, hypertension, atrial fibrillation on Eliquis , history of DVT/PE with IVC, heart failure preserved ejection fraction, morbid obesity, CKD stage IV, chronic respiratory failure requiring 2 L nasal cannula at baseline, anxiety, insulin -dependent diabetes mellitus, who presents emergency department on 11/02/2023 for chief concerns of intentional overdose of OxyContin  on 11/01/2023.  08/01: admitted to behavioral health service on 11/06/2023 for the overdose attempt / SI. 08/06: hospitalist consulted for intractable N/V. Vitals at the time of hospitalist consultation showed temperature of 98.2, respiration rate 20, heart rate 50, blood pressure 163/60, SpO2 of 97% on 2 L nasal cannula. The only labs available to me at the time of hospitalist consultation was glucose capillary value of 153. She also complained of constipation for about 2 weeks. Patient was also found to have weeping edema from the abdomen and weeping edema from left leg with bloody drainage soaking through left Unna boot. Admitted to hospitalist  08/07: palliative care consulted to clarify code status... ***     Consultants:  Psychiatry Palliative Care Nephrology   Procedures/Surgeries: ***      ASSESSMENT & PLAN:   Acute on Chronic HFpEF Recent echo 08/2023 noted EF 60-65%, moderate LVH, normal RV. Recent R/LHC noted no significant CAD, PA pressure 62/16, wedge of 11, PVR 4.4, group 3 pulmonary hypertension suspected Assoc w/ anasarca, ascites, peripheral edema Net IO Since Admission: 6,650 mL [11/25/23 0853] Diuresis as able - caution w/ renal function, nephrology following  Monitor BMP, daily weight and urine output. Torsemide  should be ok to  restart on discharge     CKD (chronic kidney disease) stage 4, GFR 15-29 ml/min Baseline creatinine around 1.6-2  sCr 1.73>>2.21>>2.31 Monitor BMP with diuresis Nephrology following    Chronic Lymphedema Venous stasis ulcers of left lower extremity  She had weeping edema (bloody drainage) from left leg. Continue Unna boots on bilateral lower extremities.  Intractable nausea and vomiting - resolved Suspect due to severe constipation, had not had BM in 2 weeks. Bowel regimen - escalate as needed IV antiemetics as needed Tolerating PO intake, no need for IV fluids Monitor BMP 8/13 started PPI 40 mg IV twice daily followed by 40 mg p.o. daily for possible acid reflux/GERD 8/19 as per patient ferrous sulfate  was causing nausea, changed to Niferex and now she is feeling better.   Type 2 diabetes mellitus with long-term current use of insulin  Continue Semglee  and NovoLog    Paroxysmal atrial fibrillation, bradycardia HR in the 50s not on rate control drugs Continue Eliquis   Hx UTI, Urine culture from 05/05/2023: Showed Enterobacter cloacae, pansensitive UA with only trace leukocytes, rare bacteria, 0-5 WBC's -- not consistent with UTI. Prior to admission patient was started on Keflex  so completed course for total 5 days. Recheck as needed  Hyperkalemia Lokelma  given on 8/14.  Resolved Check BMP daily   Hypothyroid TSH 14.9 elevated, increased dose of Synthroid  from 75 to 100 mcg p.o. daily from 8/15 Follow-up with PCP to repeat TSH level after 6 weeks.   Drug overdose, intentional, initial encounter  Major depressive disorder, anxiety Continue Celexa  and Abilify  Pt was initially admitted to medical floor from geropsych unit. One-on-one suicide precautions were lifted per psychiatry on 11/12/23. Pt can be discharged when medically  stable, no longer requires psych admission.   OHS/OSA Pulmonary HTN type III Doesn't tolerate CPAP Supplement O2 as needed, target sats >  90% Weight loss encouraged     History of DVT and PE s/p IVC filter  Eliquis    Hypertension Low diastolic BP Continue amlodipine  and monitor closely   Anemia of chronic disease Iron  profile, B12 within normal range Monitor H&H   Intertrigo, abdominal folds 8/19 started Lotrisone  cream twice daily followed by nystatin  powder Applied Lotrisone  cream on bilateral lower extremities well for possible dermatitis associated with chronic venous stasis/lymphedema Continue Lotrisone  for at least 2-4 weeks    Super Morbid Obesity based on BMI: Body mass index is 58.73 kg/m.SABRA Significantly low or high BMI is associated with higher medical risk.  Underweight - under 18  overweight - 25 to 29 obese - 30 or more Class 1 obesity: BMI of 30.0 to 34 Class 2 obesity: BMI of 35.0 to 39 Class 3 obesity: BMI of 40.0 to 49 Super Morbid Obesity: BMI 50-59 Super-super Morbid Obesity: BMI 60+ Healthy nutrition and physical activity advised as adjunct to other disease management and risk reduction treatments    DVT prophylaxis: *** IV fluids: *** continuous IV fluids  Nutrition: *** Central lines / other devices: ***  Code Status: *** ACP documentation reviewed: *** none on file in [facility]  TOC needs: *** Medical barriers to dispo: ***. Expected medical readiness for discharge ***.              Subjective / Brief ROS:  Patient reports *** Denies CP/SOB.  Pain controlled.  Denies new weakness.  Tolerating diet ***.  Reports no concerns w/ urination/defecation.   Family Communication: ***    Objective Findings:  Vitals:   11/24/23 1658 11/25/23 0423 11/25/23 0500 11/25/23 0758  BP: (!) 139/44 (!) 138/52  (!) 154/58  Pulse: (!) 49 (!) 50  (!) 50  Resp: 19 16  19   Temp: 98.9 F (37.2 C) 98.5 F (36.9 C)  98.3 F (36.8 C)  TempSrc:  Oral    SpO2: 100% 98%  100%  Weight:   (!) 141 kg   Height:        Intake/Output Summary (Last 24 hours) at 11/25/2023 0857 Last data  filed at 11/24/2023 1900 Gross per 24 hour  Intake 720 ml  Output --  Net 720 ml   Filed Weights   11/23/23 0438 11/24/23 0500 11/25/23 0500  Weight: (!) 140.9 kg (!) 138.9 kg (!) 141 kg    Examination:  Physical Exam       Scheduled Medications:   allopurinol   50 mg Oral QODAY   amLODipine   10 mg Oral Daily   apixaban   5 mg Oral BID   ARIPiprazole   2 mg Oral QHS   citalopram   40 mg Oral QHS   clotrimazole -betamethasone    Topical BID   feeding supplement  237 mL Oral BID BM   insulin  aspart  0-15 Units Subcutaneous TID WC   insulin  aspart  0-5 Units Subcutaneous QHS   insulin  glargine-yfgn  20 Units Subcutaneous Daily   iron  polysaccharides  150 mg Oral Daily   levothyroxine   100 mcg Oral QAC breakfast   melatonin  10 mg Oral QHS   multivitamin with minerals  1 tablet Oral Daily   nystatin    Topical BID   pantoprazole   40 mg Oral Daily   polyethylene glycol  17 g Oral Daily   rosuvastatin   10 mg Oral Daily   senna-docusate  1 tablet Oral BID   torsemide   40 mg Oral BID    Continuous Infusions:   PRN Medications:  alum & mag hydroxide-simeth, diazepam , guaiFENesin -dextromethorphan , neomycin -bacitracin -polymyxin, ondansetron  (ZOFRAN ) IV, mouth rinse, oxyCODONE   Antimicrobials from admission:  Anti-infectives (From admission, onward)    None           Data Reviewed:  I have personally reviewed the following...  CBC: Recent Labs  Lab 11/19/23 0433 11/20/23 0552 11/21/23 0530 11/22/23 0609 11/23/23 0542 11/24/23 0353 11/25/23 0630  WBC 6.6   < > 6.5 6.5 6.7 6.6 6.4  NEUTROABS 5.2  --   --   --   --   --   --   HGB 7.9*   < > 7.9* 7.8* 7.8* 7.9* 7.9*  HCT 27.2*   < > 26.0* 25.8* 26.7* 26.2* 26.9*  MCV 106.3*   < > 105.7* 104.0* 105.5* 104.0* 105.1*  PLT 125*   < > 126* 124* 134* 135* 131*   < > = values in this interval not displayed.   Basic Metabolic Panel: Recent Labs  Lab 11/19/23 0433 11/20/23 0552 11/21/23 0530 11/22/23 0609  11/23/23 0542 11/24/23 0353 11/25/23 0630  NA 141 142 139 139 141 138 142  K 5.2* 4.7 4.0 3.8 3.6 4.0 3.7  CL 99 100 101 98 98 97* 100  CO2 34* 32 32 32 32 32 32  GLUCOSE 110* 124* 157* 122* 126* 169* 130*  BUN 52* 55* 59* 62* 62* 65* 64*  CREATININE 1.73* 1.96* 2.29* 2.21* 2.11* 2.31* 2.34*  CALCIUM  9.0 8.8* 8.8* 8.7* 8.9 9.0 9.0  MG 2.5* 2.7* 2.7*  --   --   --   --   PHOS 3.4 3.7 3.7  --   --   --   --    GFR: Estimated Creatinine Clearance: 31.3 mL/min (A) (by C-G formula based on SCr of 2.34 mg/dL (H)). Liver Function Tests: No results for input(s): AST, ALT, ALKPHOS, BILITOT, PROT, ALBUMIN  in the last 168 hours. No results for input(s): LIPASE, AMYLASE in the last 168 hours. No results for input(s): AMMONIA in the last 168 hours. Coagulation Profile: No results for input(s): INR, PROTIME in the last 168 hours. Cardiac Enzymes: No results for input(s): CKTOTAL, CKMB, CKMBINDEX, TROPONINI in the last 168 hours. BNP (last 3 results) No results for input(s): PROBNP in the last 8760 hours. HbA1C: No results for input(s): HGBA1C in the last 72 hours. CBG: Recent Labs  Lab 11/24/23 0759 11/24/23 1137 11/24/23 1539 11/24/23 2052 11/25/23 0756  GLUCAP 115* 179* 155* 169* 120*   Lipid Profile: No results for input(s): CHOL, HDL, LDLCALC, TRIG, CHOLHDL, LDLDIRECT in the last 72 hours. Thyroid  Function Tests: No results for input(s): TSH, T4TOTAL, FREET4, T3FREE, THYROIDAB in the last 72 hours. Anemia Panel: No results for input(s): VITAMINB12, FOLATE, FERRITIN, TIBC, IRON , RETICCTPCT in the last 72 hours. Most Recent Urinalysis On File:     Component Value Date/Time   COLORURINE YELLOW (A) 11/12/2023 0244   APPEARANCEUR CLEAR (A) 11/12/2023 0244   LABSPEC 1.010 11/12/2023 0244   PHURINE 5.0 11/12/2023 0244   GLUCOSEU NEGATIVE 11/12/2023 0244   HGBUR NEGATIVE 11/12/2023 0244   BILIRUBINUR NEGATIVE  11/12/2023 0244   KETONESUR NEGATIVE 11/12/2023 0244   PROTEINUR >=300 (A) 11/12/2023 0244   UROBILINOGEN 0.2 08/31/2014 1158   NITRITE NEGATIVE 11/12/2023 0244   LEUKOCYTESUR TRACE (A) 11/12/2023 0244   Sepsis Labs: @LABRCNTIP (procalcitonin:4,lacticidven:4) Microbiology: No results found for this or any previous visit (from  the past 240 hours).    Radiology Studies last 3 days: No results found.     Time spent: ***    Charlsey Moragne, DO [facility] 11/25/2023, 8:57 AM    Dictation software may have been used to generate the above note. Typos may occur and escape review in typed/dictated notes. Please contact Dr Marsa directly for clarity if needed.  Staff may message me via secure chat in Epic  but this may not receive an immediate response,  please page me for urgent matters!  If 7PM-7AM, please contact night coverage [email or web address]

## 2023-11-25 NOTE — TOC Progression Note (Signed)
 Transition of Care [facility]) - Progression Note    Patient Details  Name: [name] MRN: [id number] Date of Birth: 04-16-1955  Transition of [facility]) CM/SW Contact  Marinda Cooks, RN Phone Number: 11/25/2023, 8:41 AM  Clinical Narrative:    This CM called and spoke with Darryl at [facility] regarding pt's bed offer being reviewed and was updated facility declined extending bed offer expressing pt needs were too complex for their facility. This CM attempted  to reach admission liaisons at the other 2 facilities that [facility] bed [facility] [phone] & [facility] at [phone] to confirm bed offers remain and was sent to VM, message left. TOC will cont to follow DC planning /  care coordination and update as applicable.   13:20- This CM rec'd call back from  admission liaison at  [facility] and confirmed pt's bed offer .  16:51pm- This spoke with pt and confirmed bed offer at [facility] . This CM attempted to reach liaison [place] again and was sent to Voice Mail message left including this CM direct number and TOC dept main number to call back.                     Expected Discharge Plan and Services                                               Social Drivers of Health (SDOH) Interventions SDOH Screenings   Food Insecurity: No Food Insecurity (11/11/2023)  Housing: Low Risk  (11/11/2023)  Recent Concern: Housing - High Risk (11/05/2023)  Transportation Needs: No Transportation Needs (11/11/2023)  Utilities: Not At Risk (11/11/2023)  Alcohol Screen: Low Risk  (11/05/2023)  Social Connections: Moderately Isolated (11/11/2023)  Tobacco Use: Medium Risk (11/11/2023)    Readmission Risk Interventions     No data to display

## 2023-11-26 LAB — CBC
HCT: 26.9 % — ABNORMAL LOW (ref 36.0–46.0)
Hemoglobin: 8.1 g/dL — ABNORMAL LOW (ref 12.0–15.0)
MCH: 31.6 pg (ref 26.0–34.0)
MCHC: 30.1 g/dL (ref 30.0–36.0)
MCV: 105.1 fL — ABNORMAL HIGH (ref 80.0–100.0)
Platelets: 136 K/uL — ABNORMAL LOW (ref 150–400)
RBC: 2.56 MIL/uL — ABNORMAL LOW (ref 3.87–5.11)
RDW: 15.6 % — ABNORMAL HIGH (ref 11.5–15.5)
WBC: 6.6 K/uL (ref 4.0–10.5)
nRBC: 0 % (ref 0.0–0.2)

## 2023-11-26 LAB — BASIC METABOLIC PANEL WITH GFR
Anion gap: 10 (ref 5–15)
BUN: 64 mg/dL — ABNORMAL HIGH (ref 8–23)
CO2: 32 mmol/L (ref 22–32)
Calcium: 8.8 mg/dL — ABNORMAL LOW (ref 8.9–10.3)
Chloride: 99 mmol/L (ref 98–111)
Creatinine, Ser: 2.33 mg/dL — ABNORMAL HIGH (ref 0.44–1.00)
GFR, Estimated: 22 mL/min — ABNORMAL LOW (ref >60)
Glucose, Bld: 140 mg/dL — ABNORMAL HIGH (ref 70–99)
Potassium: 3.6 mmol/L (ref 3.5–5.1)
Sodium: 141 mmol/L (ref 135–145)

## 2023-11-26 LAB — GLUCOSE, CAPILLARY
Glucose-Capillary: 103 mg/dL — ABNORMAL HIGH (ref 70–99)
Glucose-Capillary: 184 mg/dL — ABNORMAL HIGH (ref 70–99)
Glucose-Capillary: 166 mg/dL — ABNORMAL HIGH (ref 70–99)
Glucose-Capillary: 246 mg/dL — ABNORMAL HIGH (ref 70–99)

## 2023-11-26 NOTE — Progress Notes (Signed)
 Progress Note    [name]  FMW:[id number] DOB: Jan 29, 1956  DOA: 11/11/2023 PCP: Valma Carwin, MD      Brief Narrative:    Medical records reviewed and are as summarized below:  [name] is a 68 y.o. female with pulmonary hypertension, hypertension, atrial fibrillation on Eliquis , history of DVT/PE with IVC, heart failure preserved ejection fraction, morbid obesity, CKD stage IV, chronic respiratory failure requiring 2 L nasal cannula at baseline, anxiety, insulin -dependent diabetes mellitus, who presented to the emergency department on 11/02/2023 because of intentional overdose of OxyContin  on 11/01/2023.  She was admitted to the behavioral health unit on 11/06/2023 for further management. Hospitalist team was consulted on 11/21/2023 because of concerns of intractable nausea and vomiting.  She also complained of constipation for about 2 weeks.  Patient was also found to have weeping edema from the abdomen and weeping edema from left leg with bloody drainage soaking through left Unna boot.     Assessment/Plan:   Principal Problem:   Intractable nausea and vomiting Active Problems:   Polyuria   Essential hypertension   Paroxysmal atrial fibrillation (HCC)   Type 2 diabetes mellitus with stage 3b chronic kidney disease, with long-term current use of insulin  (HCC)   (HFpEF) heart failure with preserved ejection fraction (HCC)   Morbid obesity with BMI of 50.0-59.9, adult (HCC)   LBBB (left bundle branch block)   Hypothyroidism   Pulmonary HTN, Moderate  PA pressure 09/2009   Thrombocytopenia (HCC)   Drug overdose, intentional, initial encounter (HCC)   CKD (chronic kidney disease) stage 4, GFR 15-29 ml/min (HCC)   # Acute on Chronic HFpEF, anasarca (ascites, peripheral edema)-  S/p IV Lasix . Held pm dose on 8/14   Monitor BMP, daily weight and urine output. Torsemide  on hold but restarted  Recent echo noted EF 60-65%, moderate LVH, normal RV -Recent R/LHC  noted no significant CAD, PA pressure 62/16, wedge of 11, PVR 4.4, group 3 pulmonary hypertension suspected Monitor renal function daily and diurese accordingly. 8/17 sCr 1.96>>2.21 elevated 8/16 resumed Lasix  40 mg IV twice daily after discussion with nephrology. 8/20 restart home torsemide     # CKD (chronic kidney disease) stage 4, GFR 15-29 ml/min (HCC) Baseline creatinine around 1.6-2  sCr 1.73>>2.21>>2.31 --Monitor BMP with diuresis    Chronic Lymphedema -- currently with increased, weeping edema Venous stasis ulcers of left lower extremity  Continue Unna boots on bilateral lower extremities. She had weeping edema (bloody drainage) from left leg.    Intractable nausea and vomiting - resolved Suspect due to severe constipation, no BM in 2 weeks. --Bowel regimen - escalate as needed --IV antiemetics as needed --Tolerating PO intake, no need for IV fluids --Monitor BMP 8/13 started PPI 40 mg IV twice daily followed by 40 mg p.o. daily for possible acid reflux/GERD 8/19 as per patient ferrous sulfate  was causing nausea, changed to Niferex and now she is feeling better.   # UTI, Urine culture from 05/05/2023: Showed Enterobacter cloacae, pansensitive UA with only trace leukocytes, rare bacteria, 0-5 WBC's -- not consistent with UTI. Prior to admission patient was started on Keflex  so completed course for total 5 days.   Type 2 diabetes mellitus with long-term current use of insulin  (HCC) Continue Semglee  and NovoLog  as needed   Paroxysmal atrial fibrillation, bradycardia Continue Eliquis  HR in the 50s not on rate control drugs  # Hyperkalemia, Lokelma  given on 8/14.  Resolved Check BMP    Drug overdose, intentional, initial  encounter [facility]) Major depressive disorder, anxiety Continue Celexa  and Abilify  Pt was initially admitted to medical floor from geropsych unit. One-on-one suicide precautions were lifted per psychiatry on 11/12/23. Pt can be discharged when medically  stable, no longer requires psych admission.  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 daily from 8/15 Follow-up with PCP to repeat TSH level after 6 weeks.   Anemia of chronic disease Iron  profile, B12 within normal range Monitor H&H  Generalized weakness and fatigue, vitamin D  within normal range  Intertrigo, abdominal folds 8/19 started Lotrisone  cream twice daily followed by nystatin  powder Applied Lotrisone  cream on bilateral lower extremities well for possible dermatitis associated with chronic venous stasis/lymphedema Continue Lotrisone  for at least 2-4 weeks  Body mass index is 58.73 kg/m.  (Morbid obesity)  Diet Order             Diet Carb Modified Fluid consistency: Thin; Room service appropriate? Yes  Diet effective now                   Consultants: Psychiatrist Palliative care Vascular surgeon  Procedures: None   Medications:    allopurinol   50 mg Oral QODAY   amLODipine   10 mg Oral Daily   apixaban   5 mg Oral BID   ARIPiprazole   2 mg Oral QHS   citalopram   40 mg Oral QHS   clotrimazole -betamethasone    Topical BID   feeding supplement  237 mL Oral BID BM   insulin  aspart  0-15 Units Subcutaneous TID WC   insulin  aspart  0-5 Units Subcutaneous QHS   insulin  glargine-yfgn  20 Units Subcutaneous Daily   iron  polysaccharides  150 mg Oral Daily   levothyroxine   100 mcg Oral QAC breakfast   melatonin  10 mg Oral QHS   multivitamin with minerals  1 tablet Oral Daily   nystatin    Topical BID   pantoprazole   40 mg Oral Daily   polyethylene glycol  17 g Oral Daily   rosuvastatin   10 mg Oral Daily   senna-docusate  1 tablet Oral BID   torsemide   40 mg  Oral BID   Continuous Infusions:   Anti-infectives (From admission, onward)    None        Family Communication/Anticipated D/C date and plan/Code Status   DVT prophylaxis: Place TED hose Start: 11/11/23 1406 apixaban  (ELIQUIS ) tablet 5 mg     Code Status: Limited: Do not attempt resuscitation (DNR) -DNR-LIMITED -Do Not Intubate/DNI   Family Communication: None Disposition Plan: Plan to discharge to SNF  08/21: awaiting auth for SNF   Status is: Inpatient     Subjective:   No significant events overnight.   Reports concern for increased lower extremity swelling w/ removal of unna boots 2 days ago, unna boots reapplied yesterday and pt states this has helped greatly  Patient denies any nausea vomiting today,    Patient was resting comfortably on the bed, denied any other active issues   Objective:    Vitals:   11/26/23 0500 11/26/23 0552 11/26/23 0915 11/26/23 1627  BP:  (!) 141/53 (!) 160/58 (!) 144/53  Pulse:  (!) 50 (!) 50 (!) 49  Resp:  16 16 20  Temp:  98.1 F (36.7 C) 98.8 F (37.1 C) 98.3 F (36.8 C)  TempSrc:   Oral   SpO2:  100% 98% 99%  Weight: (!) 140.3 kg     Height:       No data found.   Intake/Output Summary (Last 24 hours) at 11/26/2023 1748 Last data filed at 11/26/2023 1300 Gross per 24 hour  Intake 720 ml  Output --  Net 720 ml   Filed Weights   11/24/23 0500 11/25/23 0500 11/26/23 0500  Weight: (!) 138.9 kg (!) 141 kg (!) 140.3 kg    Exam:   GEN: NAD SKIN: Warm and dry EYES: No pallor or icterus ENT: MMM CV: RRR PULM: Equal air entry bilaterally, mild bilateral crackles, no wheezes ABD: soft, soft, distended with fluid, NT, +BS CNS: AAO x 3, non focal EXT: 3-4+ edema, Unna boots intact, most of the edema is in her thighs and abdomen/pannus  No tenderness,    Data Reviewed:   I have personally reviewed following labs and imaging studies:  Labs: Labs show the following:   Basic Metabolic Panel: Recent Labs   Lab 11/20/23 0552 11/21/23 0530 11/22/23 0609 11/23/23 0542 11/24/23 0353 11/25/23 0630 11/26/23 0355  NA 142 139 139 141 138 142 141  K 4.7 4.0 3.8 3.6 4.0 3.7 3.6  CL 100 101 98 98 97* 100 99  CO2 32 32 32 32 32 32 32  GLUCOSE 124* 157* 122* 126* 169* 130* 140*  BUN 55* 59* 62* 62* 65* 64* 64*  CREATININE 1.96* 2.29* 2.21* 2.11* 2.31* 2.34* 2.33*  CALCIUM  8.8* 8.8* 8.7* 8.9 9.0 9.0 8.8*  MG 2.7* 2.7*  --   --   --   --   --   PHOS 3.7 3.7  --   --   --   --   --    GFR Estimated Creatinine Clearance: 31.4 mL/min (A) (by C-G formula based on SCr of 2.33 mg/dL (H)). Liver Function Tests: No results for input(s): AST, ALT, ALKPHOS, BILITOT, PROT, ALBUMIN  in the last 168 hours.  No results for input(s): LIPASE, AMYLASE in the last 168 hours. No results for input(s): AMMONIA in the last 168 hours. Coagulation profile No results for input(s): INR, PROTIME in the last 168 hours.  CBC: Recent Labs  Lab 11/22/23 0609 11/23/23 0542 11/24/23 0353 11/25/23 0630 11/26/23 0355  WBC 6.5 6.7 6.6 6.4 6.6  HGB 7.8* 7.8* 7.9* 7.9* 8.1*  HCT 25.8* 26.7* 26.2* 26.9* 26.9*  MCV 104.0* 105.5* 104.0* 105.1* 105.1*  PLT 124* 134* 135* 131* 136*   Cardiac Enzymes: No results for input(s): CKTOTAL, CKMB, CKMBINDEX, TROPONINI in the last 168 hours. BNP (last 3 results) No results for input(s): PROBNP in the last 8760 hours. CBG: Recent Labs  Lab 11/25/23 1604 11/25/23 2203 11/26/23 0827 11/26/23 1204 11/26/23 1624  GLUCAP 213* 195* 103* 184* 166*   D-Dimer: No results for input(s): DDIMER in the last 72 hours. Hgb A1c: No results for input(s): HGBA1C in the last 72 hours. Lipid Profile: No results for input(s): CHOL, HDL, LDLCALC, TRIG, CHOLHDL, LDLDIRECT in the last 72 hours. Thyroid  function studies: No results for input(s): TSH, T4TOTAL, T3FREE, THYROIDAB in the last 72 hours.  Invalid input(s): FREET3 Anemia work  up: No results for input(s): VITAMINB12, FOLATE, FERRITIN, TIBC, IRON , RETICCTPCT in the last 72 hours.  Sepsis Labs: Recent Labs  Lab 11/23/23 0542 11/24/23 0353 11/25/23 0630 11/26/23 0355  WBC 6.7 6.6 6.4 6.6  Microbiology No results found for this or any previous visit (from the past 240 hours).  Procedures and diagnostic studies:  No results found.     LOS: 13 days   Laneta Blunt  [facility]   Pager on [email or web address]. If 7PM-7AM, please contact night-coverage at [email or web address]     11/26/2023, 5:48 PM

## 2023-11-26 NOTE — Plan of Care (Signed)
  Problem: Education: Goal: Knowledge of General Education information will improve Description: Including pain rating scale, medication(s)/side effects and non-pharmacologic comfort measures Outcome: Progressing   Problem: Health Behavior/Discharge Planning: Goal: Ability to manage health-related needs will improve Outcome: Progressing   Problem: Clinical Measurements: Goal: Ability to maintain clinical measurements within normal limits will improve Outcome: Progressing Goal: Will remain free from infection Outcome: Progressing Goal: Diagnostic test results will improve Outcome: Progressing Goal: Respiratory complications will improve Outcome: Progressing Goal: Cardiovascular complication will be avoided Outcome: Progressing   Problem: Activity: Goal: Risk for activity intolerance will decrease Outcome: Progressing   Problem: Nutrition: Goal: Adequate nutrition will be maintained Outcome: Progressing   Problem: Coping: Goal: Level of anxiety will decrease Outcome: Progressing   Problem: Elimination: Goal: Will not experience complications related to bowel motility Outcome: Progressing Goal: Will not experience complications related to urinary retention Outcome: Progressing   Problem: Pain Managment: Goal: General experience of comfort will improve and/or be controlled Outcome: Progressing   Problem: Safety: Goal: Ability to remain free from injury will improve Outcome: Progressing   Problem: Skin Integrity: Goal: Risk for impaired skin integrity will decrease Outcome: Progressing   Problem: Education: Goal: Ability to describe self-care measures that may prevent or decrease complications (Diabetes Survival Skills Education) will improve Outcome: Progressing Goal: Individualized Educational Video(s) Outcome: Progressing   Problem: Coping: Goal: Ability to adjust to condition or change in health will improve Outcome: Progressing   Problem: Fluid  Volume: Goal: Ability to maintain a balanced intake and output will improve Outcome: Progressing   Problem: Health Behavior/Discharge Planning: Goal: Ability to identify and utilize available resources and services will improve Outcome: Progressing Goal: Ability to manage health-related needs will improve Outcome: Progressing

## 2023-11-26 NOTE — Progress Notes (Signed)
 Mobility Specialist - Progress Note   11/26/23 0910  Mobility  Activity Ambulated with assistance  Level of Assistance Standby assist, set-up cues, supervision of patient - no hands on  Assistive Device Front wheel walker  Distance Ambulated (ft) 16 ft  Activity Response Tolerated well  Mobility visit 1 Mobility  Mobility Specialist Start Time (ACUTE ONLY) [id number]  Mobility Specialist Stop Time (ACUTE ONLY) 0905  Mobility Specialist Time Calculation (min) (ACUTE ONLY) 11 min   Pt semi fowler upon entry, utilizing RA. Pt completed bed mob ModI, STS to RW and amb to/from the bathroom w/ SBA--- fatigue noted during return to bed. Pt left supine with alarm set and needs within reach.  America Silvan Mobility Specialist 11/26/23 9:12 AM

## 2023-11-26 NOTE — Consult Note (Addendum)
 WOC Nurse re-consult Note: Refer to previous WOC notes on 8/18.  Requested to reapply bilat Una boots, legs began to resume edema and weeping when Una boots were left off. Generalized edema and erythremia and weeping mod amt yellow fluid to bilat legs.  Dressings in place to bilat legs as previously described, applied bilat Una boots and coban.  Orders provided as follows: WOC will change bilat Una boots Q THURS over dressings as follows: Apply Aquacel Soila # [phone]) to bilat leg wounds Q Mon/Wed/Fri then cover with foam dressings.  Moisten previous dressings with NS to remove each time.  Pt plans to discharge to another facility today and will need weekly Una boot and dressing changes to bilat legs at that location.   Thank-you,  Stephane Fought MSN, RN, CWOCN, CWCN-AP, CNS Contact Mon-Fri 0700-1500: [phone]

## 2023-11-26 NOTE — Progress Notes (Signed)
 [facility]  PROGRESS NOTE   Subjective:   Patient seen laying in bed Partially completed breakfast tray at bedside Denies shortness of breath  Creatinine 2.33  Objective:  Vital signs: Blood pressure (!) 160/58, pulse (!) 50, temperature 98.8 F (37.1 C), temperature source Oral, resp. rate 16, height 5' 1 (1.549 m), weight (!) 140.3 kg, SpO2 98%.  Intake/Output Summary (Last 24 hours) at 11/26/2023 1051 Last data filed at 11/26/2023 0900 Gross per 24 hour  Intake 720 ml  Output --  Net 720 ml   Filed Weights   11/24/23 0500 11/25/23 0500 11/26/23 0500  Weight: (!) 138.9 kg (!) 141 kg (!) 140.3 kg     Physical Exam: General:  No acute distress  Head:  Normocephalic, atraumatic. Moist oral mucosal membranes  Eyes:  Anicteric  Lungs:   Clear to auscultation, normal effort  Heart:  S1S2 no rubs  Abdomen:   Soft, nontender, bowel sounds present  Extremities: 1+ peripheral edema.  Neurologic:  Awake, alert, following commands  Skin:  No lesions  Access: None    Basic Metabolic Panel: Recent Labs  Lab 11/20/23 0552 11/21/23 0530 11/22/23 0609 11/23/23 0542 11/24/23 0353 11/25/23 0630 11/26/23 0355  NA 142 139 139 141 138 142 141  K 4.7 4.0 3.8 3.6 4.0 3.7 3.6  CL 100 101 98 98 97* 100 99  CO2 32 32 32 32 32 32 32  GLUCOSE 124* 157* 122* 126* 169* 130* 140*  BUN 55* 59* 62* 62* 65* 64* 64*  CREATININE 1.96* 2.29* 2.21* 2.11* 2.31* 2.34* 2.33*  CALCIUM  8.8* 8.8* 8.7* 8.9 9.0 9.0 8.8*  MG 2.7* 2.7*  --   --   --   --   --   PHOS 3.7 3.7  --   --   --   --   --    GFR: Estimated Creatinine Clearance: 31.4 mL/min (A) (by C-G formula based on SCr of 2.33 mg/dL (H)).  Liver Function Tests: No results for input(s): AST, ALT, ALKPHOS, BILITOT, PROT, ALBUMIN  in the last 168 hours. No results for input(s): LIPASE, AMYLASE in the last 168 hours. No results for input(s): AMMONIA in the last 168 hours.  CBC: Recent Labs  Lab  11/22/23 0609 11/23/23 0542 11/24/23 0353 11/25/23 0630 11/26/23 0355  WBC 6.5 6.7 6.6 6.4 6.6  HGB 7.8* 7.8* 7.9* 7.9* 8.1*  HCT 25.8* 26.7* 26.2* 26.9* 26.9*  MCV 104.0* 105.5* 104.0* 105.1* 105.1*  PLT 124* 134* 135* 131* 136*     HbA1C: Hgb A1c MFr Bld  Date/Time Value Ref Range Status  09/04/2023 04:06 AM 5.7 (H) 4.8 - 5.6 % Final    Comment:    (NOTE) Diagnosis of Diabetes The following HbA1c ranges recommended by the American Diabetes Association (ADA) may be used as an aid in the diagnosis of diabetes mellitus.  Hemoglobin             Suggested A1C NGSP%              Diagnosis  <5.7                   Non Diabetic  5.7-6.4                Pre-Diabetic  >6.4                   Diabetic  <7.0  Glycemic control for                       adults with diabetes.    09/09/2021 04:35 AM 6.0 (H) 4.8 - 5.6 % Final    Comment:    (NOTE) Pre diabetes:          5.7%-6.4%  Diabetes:              >6.4%  Glycemic control for   <7.0% adults with diabetes     Urinalysis: No results for input(s): COLORURINE, LABSPEC, PHURINE, GLUCOSEU, HGBUR, BILIRUBINUR, KETONESUR, PROTEINUR, UROBILINOGEN, NITRITE, LEUKOCYTESUR in the last 72 hours.  Invalid input(s): APPERANCEUR    Imaging: No results found.    Medications:     allopurinol   50 mg Oral QODAY   amLODipine   10 mg Oral Daily   apixaban   5 mg Oral BID   ARIPiprazole   2 mg Oral QHS   citalopram   40 mg Oral QHS   clotrimazole -betamethasone    Topical BID   feeding supplement  237 mL Oral BID BM   insulin  aspart  0-15 Units Subcutaneous TID WC   insulin  aspart  0-5 Units Subcutaneous QHS   insulin  glargine-yfgn  20 Units Subcutaneous Daily   iron  polysaccharides  150 mg Oral Daily   levothyroxine   100 mcg Oral QAC breakfast   melatonin  10 mg Oral QHS   multivitamin with minerals  1 tablet Oral Daily   nystatin    Topical BID   pantoprazole   40 mg Oral Daily    polyethylene glycol  17 g Oral Daily   rosuvastatin   10 mg Oral Daily   senna-docusate  1 tablet Oral BID   torsemide   40 mg Oral BID    Assessment/ Plan:     68 y.o. female with a PMHx of hypertension, coronary artery disease, atrial fibrillation, congestive heart failure, DVT/PE, history of IVC placement, morbid obesity, diabetes, peripheral vascular disease, chronic kidney disease admitted with history of intentional overdose of OxyContin .  She has been on diuretics.  Now she is found to have worsening renal insufficiency and so renal evaluation is requested.  She has been on torsemide  at 40 mg twice a day.  She has been fluid and dietary noncompliant     Principal Problem:   Intractable nausea and vomiting Active Problems:   Morbid obesity with BMI of 50.0-59.9, adult (HCC)   Essential hypertension   Pulmonary HTN, Moderate  PA pressure 09/2009   LBBB (left bundle branch block)   Thrombocytopenia (HCC)   Paroxysmal atrial fibrillation (HCC)   Type 2 diabetes mellitus with stage 3b chronic kidney disease, with long-term current use of insulin  (HCC)   Hypothyroidism   (HFpEF) heart failure with preserved ejection fraction (HCC)   Drug overdose, intentional, initial encounter (HCC)   Polyuria   CKD (chronic kidney disease) stage 4, GFR 15-29 ml/min (HCC)     #1: Chronic kidney disease stage IV: Patient has chronic kidney disease most likely secondary to diabetic kidney disease complicated by decreased cardiac output during the ischemic renal disease.  Renal ultrasound showed 2.2 cm renal mass, was awaiting outpatient urology follow up.  Creatinine remains stable, transitioned to Torsemide  on 8/20. Patient will need follow up with [facility] at discharge.    #2: Hypertension with chronic kidney disease. Continue amlodipine  at 10 mg daily. Continue Torsemide  40mg  twice daily.    #3: Diabetes mellitus type II with chronic kidney disease/renal manifestations:  insulin  dependent.  Home regimen includes Novolog  and Lantus . Most recent hemoglobin A1c is 5.7 on 09/04/23.      Primary team will manage SSI   #4: Renal mass: Patient is scheduled for CT scan and is scheduled to follow-up with urology.   #5: Anemia with chronic kidney disease: Continue iron  supplementation. Hgb 8.1      LOS: 13 [facility] 8/21/202510:51 AM

## 2023-11-27 LAB — GLUCOSE, CAPILLARY
Glucose-Capillary: 126 mg/dL — ABNORMAL HIGH (ref 70–99)
Glucose-Capillary: 137 mg/dL — ABNORMAL HIGH (ref 70–99)
Glucose-Capillary: 169 mg/dL — ABNORMAL HIGH (ref 70–99)
Glucose-Capillary: 219 mg/dL — ABNORMAL HIGH (ref 70–99)

## 2023-11-27 NOTE — Progress Notes (Signed)
 PROGRESS NOTE    [name]   FMW:[id number] DOB: 05-30-1955  DOA: 11/11/2023 Date of Service: 11/27/23 which is hospital day 14  PCP: Valma Carwin, MD    Hospital course / significant events:   HPI: [name] is a 68 year old female with pulmonary hypertension, hypertension, atrial fibrillation on Eliquis , history of DVT/PE with IVC, heart failure preserved ejection fraction, morbid obesity, CKD stage IV, chronic respiratory failure requiring 2 L nasal cannula at baseline, anxiety, insulin -dependent diabetes mellitus, who presents emergency department on 11/02/2023 for chief concerns of intentional overdose of OxyContin  on 11/01/2023.  08/01: admitted to behavioral health service on 11/06/2023 for the overdose attempt / SI. 08/06: hospitalist consulted for intractable N/V. Vitals at the time of hospitalist consultation showed temperature of 98.2, respiration rate 20, heart rate 50, blood pressure 163/60, SpO2 of 97% on 2 L nasal cannula. She also complained of constipation for about 2 weeks. Patient was also found to have weeping edema from the abdomen and weeping edema from left leg with bloody drainage soaking through left Unna boot. Admitted to hospitalist  08/07: palliative care consulted to clarify code status. DNR felt appropriate. Psychiatry also saw patient, no concerns on their end - continue Celexa  and Abilify , no psychiatric contraindications to discharge at this time  08/08: weeping edema from abdomen. Left Unna boot with visible oozing blood soaking through. Started IV Lasix  + albumin .  08/09: planning DC to SNF, placement pending  08/18: psychiatry reevaluation - pt is cleared from their standpoint and no need for psychiatric admission.  08/22: ok for placement tomorrow to [facility]      Consultants:  Psychiatry Palliative Care Nephrology   Procedures/Surgeries: none      ASSESSMENT & PLAN:   Acute on Chronic HFpEF Recent echo 08/2023 noted EF  60-65%, moderate LVH, normal RV. Recent R/LHC noted no significant CAD, PA pressure 62/16, wedge of 11, PVR 4.4, group 3 pulmonary hypertension suspected Assoc w/ anasarca, ascites, peripheral edema Diuresis - caution w/ renal function, nephrology following  Monitor BMP, daily weight and urine output. Torsemide  should be ok to restart on discharge     CKD (chronic kidney disease) stage 4, GFR 15-29 ml/min Baseline creatinine around 1.6-2  sCr 1.73>>2.21>>2.31 Monitor BMP with diuresis Nephrology following    Chronic Lymphedema Venous stasis ulcers of left lower extremity  She had weeping edema (bloody drainage) from left leg. Continue Unna boots on bilateral lower extremities.  Intractable nausea and vomiting - resolved Suspect due to severe constipation, had not had BM in 2 weeks. Bowel regimen - escalate as needed IV antiemetics as needed Tolerating PO intake, no need for IV fluids Monitor BMP 8/13 started PPI 40 mg IV twice daily followed by 40 mg p.o. daily for possible acid reflux/GERD 8/19 as per patient ferrous sulfate  was causing nausea, changed to Niferex and now she is feeling better.   Type 2 diabetes mellitus with long-term current use of insulin  Continue Semglee  and NovoLog    Paroxysmal atrial fibrillation, bradycardia HR in the 50s not on rate control drugs Continue Eliquis   Hx UTI, Urine culture from 05/05/2023: Showed Enterobacter cloacae, pansensitive UA with only trace leukocytes, rare bacteria, 0-5 WBC's -- not consistent with UTI. Prior to admission patient was started on Keflex  so completed course for total 5 days. Recheck as needed  Hyperkalemia Lokelma  given on 8/14.  Resolved Check BMP daily   Hypothyroid TSH 14.9 elevated, increased dose of Synthroid  from 75 to 100 mcg p.o. daily from 8/15  Follow-up with PCP to repeat TSH level after 6 weeks.   Drug overdose, intentional, initial encounter  Major depressive disorder, anxiety Continue Celexa  and  Abilify  Pt was initially admitted to medical floor from geropsych unit. One-on-one suicide precautions were lifted per psychiatry on 11/12/23. Pt can be discharged when medically stable, no longer requires psych admission.   OHS/OSA Pulmonary HTN type III Doesn't tolerate CPAP Supplement O2 as needed, target sats > 90% Weight loss encouraged     History of DVT and PE s/p IVC filter  Eliquis    Hypertension Low diastolic BP Continue amlodipine  and monitor closely   Anemia of chronic disease Iron  profile, B12 within normal range Monitor H&H   Intertrigo, abdominal folds 8/19 started Lotrisone  cream twice daily followed by nystatin  powder Applied Lotrisone  cream on bilateral lower extremities well for possible dermatitis associated with chronic venous stasis/lymphedema Continue Lotrisone  for at least 2-4 weeks    Super Morbid Obesity based on BMI: Body mass index is 58.73 kg/m.SABRA Significantly low or high BMI is associated with higher medical risk.  Underweight - under 18  overweight - 25 to 29 obese - 30 or more Class 1 obesity: BMI of 30.0 to 34 Class 2 obesity: BMI of 35.0 to 39 Class 3 obesity: BMI of 40.0 to 49 Super Morbid Obesity: BMI 50-59 Super-super Morbid Obesity: BMI 60+ Healthy nutrition and physical activity advised as adjunct to other disease management and risk reduction treatments    DVT prophylaxis: eliquis  IV fluids: no continuous IV fluids  Nutrition: carb modified diet Central lines / other devices: none  Code Status: DNR ACP documentation reviewed: DNR order, MOST and advanced directive are on file in [facility]  [facility] needs: placement Medical barriers to dispo: none.              Subjective / Brief ROS:  Patient reports no concerns today    Family Communication: none at this time     Objective Findings:  Vitals:   11/26/23 1627 11/26/23 1943 11/27/23 0518 11/27/23 0837  BP: (!) 144/53 (!) 142/50 (!) 155/75 (!) 134/39  Pulse: (!)  49 (!) 51 (!) 54 (!) 51  Resp: 20 20 16 20   Temp: 98.3 F (36.8 C) 98.2 F (36.8 C) 98.3 F (36.8 C) 99 F (37.2 C)  TempSrc:      SpO2: 99% 100% 100% 100%  Weight:      Height:        Intake/Output Summary (Last 24 hours) at 11/27/2023 1541 Last data filed at 11/27/2023 1300 Gross per 24 hour  Intake 840 ml  Output --  Net 840 ml   Filed Weights   11/24/23 0500 11/25/23 0500 11/26/23 0500  Weight: (!) 138.9 kg (!) 141 kg (!) 140.3 kg    Examination:  Physical Exam Constitutional:      General: She is not in acute distress.    Appearance: She is obese.  Cardiovascular:     Rate and Rhythm: Normal rate.  Pulmonary:     Effort: Pulmonary effort is normal.  Neurological:     Mental Status: Mental status is at baseline.          Scheduled Medications:   allopurinol   50 mg Oral QODAY   amLODipine   10 mg Oral Daily   apixaban   5 mg Oral BID   ARIPiprazole   2 mg Oral QHS   citalopram   40 mg Oral QHS   clotrimazole -betamethasone    Topical BID   feeding supplement  237 mL Oral  BID BM   insulin  aspart  0-15 Units Subcutaneous TID WC   insulin  aspart  0-5 Units Subcutaneous QHS   insulin  glargine-yfgn  20 Units Subcutaneous Daily   iron  polysaccharides  150 mg Oral Daily   levothyroxine   100 mcg Oral QAC breakfast   melatonin  10 mg Oral QHS   multivitamin with minerals  1 tablet Oral Daily   nystatin    Topical BID   pantoprazole   40 mg Oral Daily   polyethylene glycol  17 g Oral Daily   rosuvastatin   10 mg Oral Daily   senna-docusate  1 tablet Oral BID   torsemide   40 mg Oral BID    Continuous Infusions:   PRN Medications:  alum & mag hydroxide-simeth, diazepam , guaiFENesin -dextromethorphan , loratadine , neomycin -bacitracin -polymyxin, ondansetron  (ZOFRAN ) IV, mouth rinse, oxyCODONE   Antimicrobials from admission:  Anti-infectives (From admission, onward)    None           Data Reviewed:  I have personally reviewed the  following...  CBC: Recent Labs  Lab 11/22/23 0609 11/23/23 0542 11/24/23 0353 11/25/23 0630 11/26/23 0355  WBC 6.5 6.7 6.6 6.4 6.6  HGB 7.8* 7.8* 7.9* 7.9* 8.1*  HCT 25.8* 26.7* 26.2* 26.9* 26.9*  MCV 104.0* 105.5* 104.0* 105.1* 105.1*  PLT 124* 134* 135* 131* 136*   Basic Metabolic Panel: Recent Labs  Lab 11/21/23 0530 11/22/23 0609 11/23/23 0542 11/24/23 0353 11/25/23 0630 11/26/23 0355  NA 139 139 141 138 142 141  K 4.0 3.8 3.6 4.0 3.7 3.6  CL 101 98 98 97* 100 99  CO2 32 32 32 32 32 32  GLUCOSE 157* 122* 126* 169* 130* 140*  BUN 59* 62* 62* 65* 64* 64*  CREATININE 2.29* 2.21* 2.11* 2.31* 2.34* 2.33*  CALCIUM  8.8* 8.7* 8.9 9.0 9.0 8.8*  MG 2.7*  --   --   --   --   --   PHOS 3.7  --   --   --   --   --    GFR: Estimated Creatinine Clearance: 31.4 mL/min (A) (by C-G formula based on SCr of 2.33 mg/dL (H)). Liver Function Tests: No results for input(s): AST, ALT, ALKPHOS, BILITOT, PROT, ALBUMIN  in the last 168 hours. No results for input(s): LIPASE, AMYLASE in the last 168 hours. No results for input(s): AMMONIA in the last 168 hours. Coagulation Profile: No results for input(s): INR, PROTIME in the last 168 hours. Cardiac Enzymes: No results for input(s): CKTOTAL, CKMB, CKMBINDEX, TROPONINI in the last 168 hours. BNP (last 3 results) No results for input(s): PROBNP in the last 8760 hours. HbA1C: No results for input(s): HGBA1C in the last 72 hours. CBG: Recent Labs  Lab 11/26/23 1204 11/26/23 1624 11/26/23 2035 11/27/23 0807 11/27/23 1152  GLUCAP 184* 166* 246* 126* 137*   Lipid Profile: No results for input(s): CHOL, HDL, LDLCALC, TRIG, CHOLHDL, LDLDIRECT in the last 72 hours. Thyroid  Function Tests: No results for input(s): TSH, T4TOTAL, FREET4, T3FREE, THYROIDAB in the last 72 hours. Anemia Panel: No results for input(s): VITAMINB12, FOLATE, FERRITIN, TIBC, IRON , RETICCTPCT in the  last 72 hours. Most Recent Urinalysis On File:     Component Value Date/Time   COLORURINE YELLOW (A) 11/12/2023 0244   APPEARANCEUR CLEAR (A) 11/12/2023 0244   LABSPEC 1.010 11/12/2023 0244   PHURINE 5.0 11/12/2023 0244   GLUCOSEU NEGATIVE 11/12/2023 0244   HGBUR NEGATIVE 11/12/2023 0244   BILIRUBINUR NEGATIVE 11/12/2023 0244   KETONESUR NEGATIVE 11/12/2023 0244   PROTEINUR >=300 (A) 11/12/2023 0244  UROBILINOGEN 0.2 08/31/2014 1158   NITRITE NEGATIVE 11/12/2023 0244   LEUKOCYTESUR TRACE (A) 11/12/2023 0244   Sepsis Labs: @LABRCNTIP (procalcitonin:4,lacticidven:4) Microbiology: No results found for this or any previous visit (from the past 240 hours).    Radiology Studies last 3 days: No results found.       Nyjah Schwake, DO [facility] 11/27/2023, 3:41 PM    Dictation software may have been used to generate the above note. Typos may occur and escape review in typed/dictated notes. Please contact Dr Marsa directly for clarity if needed.  Staff may message me via secure chat in Epic  but this may not receive an immediate response,  please page me for urgent matters!  If 7PM-7AM, please contact night coverage [email or web address]

## 2023-11-27 NOTE — Progress Notes (Addendum)
 Physical Therapy Treatment Patient Details Name: [name] MRN: [id number] DOB: [birth year] Today's Date: 11/27/2023   History of Present Illness [name] is a 67yoF 11/02/2023 after intentional ingestion of oxycodone  c SI. PMH: MDD, GAD,HTN, AF, DM2, DVT/PE status post IVC filter, HFpEF, single-chamber PPM and chronic pain. Pt recently started on home O2.    PT Comments  Pt agreeable to session on 2nd attempt. Endorses needing to use BR. Remains on O2 throughout session with one occasion of desaturation to 93%. Pt does get SOB with minimal activity. Poor gait posture but no LOB observed. Pt will benefit from increased daily OOB activity moving forward. Acute PT will continue to follow and progress per current POC.   If plan is discharge home, recommend the following: A little help with walking and/or transfers;A lot of help with bathing/dressing/bathroom;Assistance with cooking/housework;Assist for transportation;Help with stairs or ramp for entrance     Equipment Recommendations  Rolling walker (2 wheels);Other (comment) (home o2)       Precautions / Restrictions Precautions Precautions: Fall Recall of Precautions/Restrictions: Intact Restrictions Weight Bearing Restrictions Per Provider Order: No Other Position/Activity Restrictions: Strict I/O, SpO2 >/= 92% pt on O2 throughout session with sao2 dropping to 93% whiel ambulating to BR.      Mobility  Bed Mobility Overal bed mobility: Needs Assistance Bed Mobility: Supine to Sit  Supine to sit: Supervision, HOB elevated, Used rails Sit to supine: Supervision   Transfers Overall transfer level: Needs assistance Equipment used: Rolling walker (2 wheels) Transfers: Sit to/from Stand Sit to Stand: Supervision     Ambulation/Gait Ambulation/Gait assistance: Supervision Gait Distance (Feet): 30 Feet Assistive device: Rolling walker (2 wheels) Gait Pattern/deviations: Step-through pattern Gait velocity: decreased   General Gait Details: Pt was able to ambulate fom EOB to BR. SOB noted but sao2 >93%. she was able to I'ly perform hygiene care. Stood and tolerated ambulation to recliner.    Balance Overall balance assessment: Needs assistance Sitting-balance support: Bilateral upper extremity supported Sitting balance-[name]: Good     Standing balance support: During functional activity Standing balance-[name]: Good         Communication Communication Communication: No apparent difficulties  Cognition Arousal: Alert Behavior During Therapy: WFL for tasks assessed/performed   PT - Cognitive impairments: No apparent impairments      PT - Cognition Comments: Pt is A and O x 3 Following commands: Intact      Cueing Cueing Techniques: Verbal cues     General Comments General comments (skin integrity, edema, etc.): fatigues easily, but does well on 2L 02 via [place]      Pertinent Vitals/Pain Pain Assessment Pain Assessment: No/denies pain Pain Score: 0-No pain     PT Goals (current goals can now be found in the care plan section) Acute Rehab PT Goals Patient Stated Goal: get better so I can get out of hospital Progress towards PT goals: Progressing toward goals    Frequency    Min 2X/week       AM-PAC PT 6 Clicks Mobility   Outcome Measure  Help needed turning from your back to your side while in a flat bed without using bedrails?: A Little Help needed moving from lying on your back to sitting on the side of a flat bed without using bedrails?: A Little Help needed moving to and from a bed to a chair (including a wheelchair)?: A Little Help needed standing up from a chair using your arms (e.g., wheelchair or bedside chair)?:  A Little Help needed to walk in hospital room?: A Little Help needed climbing 3-5 steps with a railing? : A Lot 6 Click Score: 17    End of Session Equipment Utilized During Treatment: Oxygen Activity Tolerance: Patient tolerated treatment  well Patient left: in chair;with call bell/phone within reach;with chair alarm set Nurse Communication: Mobility status PT Visit Diagnosis: Difficulty in walking, not elsewhere classified (R26.2);Other abnormalities of gait and mobility (R26.89);Muscle weakness (generalized) (M62.81)     Time: 8882-8870 PT Time Calculation (min) (ACUTE ONLY): 12 min  Charges:    $Gait Training: 8-22 mins PT General Charges $$ ACUTE PT VISIT: 1 Visit                    Rankin Essex PTA 11/27/23, 3:25 PM

## 2023-11-27 NOTE — Progress Notes (Addendum)
 Occupational Therapy Treatment Patient Details Name: [name] MRN: [id number] DOB: June 27, 1955 Today's Date: 11/27/2023   History of present illness SHANEA KARNEY is a 67yoF 11/02/2023 after intentional ingestion of oxycodone  c SI. PMH: MDD, GAD,HTN, AF, DM2, DVT/PE status post IVC filter, HFpEF, single-chamber PPM and chronic pain. Pt recently started on home O2.   OT comments  Pt is seated in recliner on arrival. Pleasant and agreeable to OT session. Goals were extended this date and pt is making great progress towards current goals. She denies pain. Pt performed STS from recliner with SBA to RW and ambulated to the sink ~8-10 ft. Pt able to stand for extended period of time at the sink with UE support to perform hair washing. Following task, pt required seated rest break on BSC, but able to stand and ambulate back to bed using RW with CGA/SBA. Pt continues to fatigue easily with all activity. She demo grooming tasks seated on EOB to comb her hair with set up assist. UB dressing with Min A to doff/donn gown. Min A for BLE management to return to supine, able to scoot to [facility] with supervision and use of bed features, e.g. trendelenburg and bed rails. Pt returned to bed with all needs in place and will cont to require skilled acute OT services to maximize her safety and IND to return to PLOF.       If plan is discharge home, recommend the following:  A little help with bathing/dressing/bathroom;Help with stairs or ramp for entrance   Equipment Recommendations  Other (comment) (RW)    Recommendations for Other Services      Precautions / Restrictions Precautions Precautions: Fall Recall of Precautions/Restrictions: Intact Restrictions Weight Bearing Restrictions Per Provider Order: No Other Position/Activity Restrictions: Strict I/O, SpO2 >/= 92%       Mobility Bed Mobility Overal bed mobility: Needs Assistance Bed Mobility: Sit to Supine       Sit to supine: Min assist    General bed mobility comments: BLE management to return to bed    Transfers Overall transfer level: Needs assistance Equipment used: Rolling walker (2 wheels) Transfers: Sit to/from Stand Sit to Stand: Supervision           General transfer comment: increased time and rocking utilized to stand from recliner to RW, able to ambulate in room with SBA using RW to stand at sink extended period for hair washing     Balance Overall balance assessment: Needs assistance Sitting-balance support: Feet supported Sitting balance-[name]: Good     Standing balance support: During functional activity, Single extremity supported Standing balance-[name]: Fair Standing balance comment: standing at sink with UE support to wash hair                           ADL either performed or assessed with clinical judgement   ADL Overall ADL's : Needs assistance/impaired     Grooming: Wash/dry face;Wash/dry hands;Standing;Brushing hair;Contact guard assist Grooming Details (indicate cue type and reason): stood at sink to wash her hair during session with ability to stand for extended time with UE support on sink         Upper Body Dressing : Minimal assistance;Sitting Upper Body Dressing Details (indicate cue type and reason): to change out soiled gown     Toilet Transfer: Rolling walker (2 wheels);BSC/3in1;Contact guard assist;Supervision/safety Toilet Transfer Details (indicate cue type and reason): seated rest break taken on BSC after hair washing,  able to stand from there with CGA/SBA                Extremity/Trunk Assessment              Vision       Perception     Praxis     Communication Communication Communication: No apparent difficulties   Cognition Arousal: Alert Behavior During Therapy: [facility] for tasks assessed/performed                                 Following commands: Intact        Cueing   Cueing Techniques: Verbal cues   Exercises      Shoulder Instructions       General Comments fatigues easily, but does well on 2L 02 via [place]    Pertinent Vitals/ Pain       Pain Assessment Pain Assessment: No/denies pain Pain Intervention(s): Monitored during session  Home Living                                          Prior Functioning/Environment              Frequency  Min 1X/week        Progress Toward Goals  OT Goals(current goals can now be found in the care plan section)  Progress towards OT goals: Progressing toward goals  Acute Rehab OT Goals Patient Stated Goal: go to LTC OT Goal Formulation: With patient Time For Goal Achievement: 11/27/23 Potential to Achieve Goals: Fair  Plan      Co-evaluation                 AM-PAC OT 6 Clicks Daily Activity     Outcome Measure   Help from another person eating meals?: None Help from another person taking care of personal grooming?: None Help from another person toileting, which includes using toliet, bedpan, or urinal?: A Little Help from another person bathing (including washing, rinsing, drying)?: A Lot Help from another person to put on and taking off regular upper body clothing?: A Little Help from another person to put on and taking off regular lower body clothing?: A Lot 6 Click Score: 18    End of Session Equipment Utilized During Treatment: Oxygen;Rolling walker (2 wheels)  OT Visit Diagnosis: Unsteadiness on feet (R26.81);Muscle weakness (generalized) (M62.81)   Activity Tolerance Patient tolerated treatment well   Patient Left in bed;with call bell/phone within reach;with bed alarm set   Nurse Communication Mobility status        Time: 8582-8557 OT Time Calculation (min): 25 min  Charges: OT General Charges $OT Visit: 1 Visit OT Treatments $Self Care/Home Management : 23-37 mins  Corazon Nickolas, OTR/L  11/27/23, 3:08 PM   Loni Abdon E Kendy Haston 11/27/2023, 3:05 PM

## 2023-11-27 NOTE — Progress Notes (Signed)
 [facility]  PROGRESS NOTE   Subjective:   Patient seen resting in bed Currently awaiting staff to assist with transfer to chair Remains on room air  Creatinine 2.33  Objective:  Vital signs: Blood pressure (!) 134/39, pulse (!) 51, temperature 99 F (37.2 C), resp. rate 20, height 5' 1 (1.549 m), weight (!) 140.3 kg, SpO2 100%.  Intake/Output Summary (Last 24 hours) at 11/27/2023 1226 Last data filed at 11/27/2023 0900 Gross per 24 hour  Intake 840 ml  Output --  Net 840 ml   Filed Weights   11/24/23 0500 11/25/23 0500 11/26/23 0500  Weight: (!) 138.9 kg (!) 141 kg (!) 140.3 kg     Physical Exam: General:  No acute distress  Head:  Normocephalic, atraumatic. Moist oral mucosal membranes  Eyes:  Anicteric  Lungs:   Clear to auscultation, normal effort  Heart:  S1S2 no rubs  Abdomen:   Soft, nontender, bowel sounds present  Extremities: 1+ peripheral edema.  Neurologic:  Awake, alert, following commands  Skin:  No lesions  Access: None    Basic Metabolic Panel: Recent Labs  Lab 11/21/23 0530 11/22/23 0609 11/23/23 0542 11/24/23 0353 11/25/23 0630 11/26/23 0355  NA 139 139 141 138 142 141  K 4.0 3.8 3.6 4.0 3.7 3.6  CL 101 98 98 97* 100 99  CO2 32 32 32 32 32 32  GLUCOSE 157* 122* 126* 169* 130* 140*  BUN 59* 62* 62* 65* 64* 64*  CREATININE 2.29* 2.21* 2.11* 2.31* 2.34* 2.33*  CALCIUM  8.8* 8.7* 8.9 9.0 9.0 8.8*  MG 2.7*  --   --   --   --   --   PHOS 3.7  --   --   --   --   --    GFR: Estimated Creatinine Clearance: 31.4 mL/min (A) (by C-G formula based on SCr of 2.33 mg/dL (H)).  Liver Function Tests: No results for input(s): AST, ALT, ALKPHOS, BILITOT, PROT, ALBUMIN  in the last 168 hours. No results for input(s): LIPASE, AMYLASE in the last 168 hours. No results for input(s): AMMONIA in the last 168 hours.  CBC: Recent Labs  Lab 11/22/23 0609 11/23/23 0542 11/24/23 0353 11/25/23 0630 11/26/23 0355  WBC 6.5  6.7 6.6 6.4 6.6  HGB 7.8* 7.8* 7.9* 7.9* 8.1*  HCT 25.8* 26.7* 26.2* 26.9* 26.9*  MCV 104.0* 105.5* 104.0* 105.1* 105.1*  PLT 124* 134* 135* 131* 136*     HbA1C: Hgb A1c MFr Bld  Date/Time Value Ref Range Status  09/04/2023 04:06 AM 5.7 (H) 4.8 - 5.6 % Final    Comment:    (NOTE) Diagnosis of Diabetes The following HbA1c ranges recommended by the American Diabetes Association (ADA) may be used as an aid in the diagnosis of diabetes mellitus.  Hemoglobin             Suggested A1C NGSP%              Diagnosis  <5.7                   Non Diabetic  5.7-6.4                Pre-Diabetic  >6.4                   Diabetic  <7.0                   Glycemic control for  adults with diabetes.    09/09/2021 04:35 AM 6.0 (H) 4.8 - 5.6 % Final    Comment:    (NOTE) Pre diabetes:          5.7%-6.4%  Diabetes:              >6.4%  Glycemic control for   <7.0% adults with diabetes     Urinalysis: No results for input(s): COLORURINE, LABSPEC, PHURINE, GLUCOSEU, HGBUR, BILIRUBINUR, KETONESUR, PROTEINUR, UROBILINOGEN, NITRITE, LEUKOCYTESUR in the last 72 hours.  Invalid input(s): APPERANCEUR    Imaging: No results found.    Medications:     allopurinol   50 mg Oral QODAY   amLODipine   10 mg Oral Daily   apixaban   5 mg Oral BID   ARIPiprazole   2 mg Oral QHS   citalopram   40 mg Oral QHS   clotrimazole -betamethasone    Topical BID   feeding supplement  237 mL Oral BID BM   insulin  aspart  0-15 Units Subcutaneous TID WC   insulin  aspart  0-5 Units Subcutaneous QHS   insulin  glargine-yfgn  20 Units Subcutaneous Daily   iron  polysaccharides  150 mg Oral Daily   levothyroxine   100 mcg Oral QAC breakfast   melatonin  10 mg Oral QHS   multivitamin with minerals  1 tablet Oral Daily   nystatin    Topical BID   pantoprazole   40 mg Oral Daily   polyethylene glycol  17 g Oral Daily   rosuvastatin   10 mg Oral Daily   senna-docusate  1  tablet Oral BID   torsemide   40 mg Oral BID    Assessment/ Plan:     68 y.o. female with a PMHx of hypertension, coronary artery disease, atrial fibrillation, congestive heart failure, DVT/PE, history of IVC placement, morbid obesity, diabetes, peripheral vascular disease, chronic kidney disease admitted with history of intentional overdose of OxyContin .  She has been on diuretics.  Now she is found to have worsening renal insufficiency and so renal evaluation is requested.  She has been on torsemide  at 40 mg twice a day.  She has been fluid and dietary noncompliant     Principal Problem:   Intractable nausea and vomiting Active Problems:   Morbid obesity with BMI of 50.0-59.9, adult (HCC)   Essential hypertension   Pulmonary HTN, Moderate  PA pressure 09/2009   LBBB (left bundle branch block)   Thrombocytopenia (HCC)   Paroxysmal atrial fibrillation (HCC)   Type 2 diabetes mellitus with stage 3b chronic kidney disease, with long-term current use of insulin  (HCC)   Hypothyroidism   (HFpEF) heart failure with preserved ejection fraction (HCC)   Drug overdose, intentional, initial encounter (HCC)   Polyuria   CKD (chronic kidney disease) stage 4, GFR 15-29 ml/min (HCC)     #1: Chronic kidney disease stage IV: Patient has chronic kidney disease most likely secondary to diabetic kidney disease complicated by decreased cardiac output during the ischemic renal disease.  Renal ultrasound showed 2.2 cm renal mass, was awaiting outpatient urology follow up.  Renal function remains at baseline. Transitioned to Torsemide  on 8/20. Patient will need follow up with [facility] at discharge.    #2: Hypertension with chronic kidney disease. Continue amlodipine  at 10 mg daily. Continue Torsemide  40mg  twice daily.    #3: Diabetes mellitus type II with chronic kidney disease/renal manifestations: insulin  dependent. Home regimen includes Novolog  and Lantus . Most recent hemoglobin A1c  is 5.7 on 09/04/23.      Primary team will manage  SSI   #4: Renal mass: Patient is scheduled for CT scan and is scheduled to follow-up with urology.   #5: Anemia with chronic kidney disease: Continue iron  supplementation. Hgb 8.1   Due to stable renal function, we will sign off at this time.    LOS: 14 [facility] 8/22/202512:26 PM

## 2023-11-27 NOTE — TOC Progression Note (Addendum)
 Transition of Care [facility]) - Progression Note    Patient Details  Name: [name] MRN: [id number] Date of Birth: [birth year]  [facility] CM/SW Contact  Dalia GORMAN Fuse, RN Phone Number: 11/27/2023, 11:09 AM  Clinical Narrative:     TOC called to [facility] to give auth. The Admissions Coordinator advised that after further review she is not appropriate for SNF. TOC outreached to [employer] with [facility] requesting a callback. TOC received a callback from [facility] and explained the patient has been medically cleared by psych.  TOC resent FL2 to be considered for [facility] in [place]. Patient received bed offer from [place], [place], at  [facility]. [facility] to change auth from [place] to the accepting facilty.   [facility] Certified in Total Certification [phone] Dates: 8/22-8/28/202   [facility] spoke with [name] and she is in agreement with accepting the bed.   TOC called [facility] to accept bed offer. [facility] can accept the patient this weekend. TOC is awaiting auth for [facility].                     Expected Discharge Plan and Services                                               Social Drivers of Health (SDOH) Interventions SDOH Screenings   Food Insecurity: No Food Insecurity (11/11/2023)  Housing: Low Risk  (11/11/2023)  Recent Concern: Housing - High Risk (11/05/2023)  Transportation Needs: No Transportation Needs (11/11/2023)  Utilities: Not At Risk (11/11/2023)  Alcohol Screen: Low Risk  (11/05/2023)  Social Connections: Moderately Isolated (11/11/2023)  Tobacco Use: Medium Risk (11/11/2023)    Readmission Risk Interventions     No data to display

## 2023-11-27 NOTE — Plan of Care (Signed)
  Problem: Education: Goal: Knowledge of General Education information will improve Description: Including pain rating scale, medication(s)/side effects and non-pharmacologic comfort measures Outcome: Progressing   Problem: Health Behavior/Discharge Planning: Goal: Ability to manage health-related needs will improve Outcome: Progressing   Problem: Clinical Measurements: Goal: Ability to maintain clinical measurements within normal limits will improve Outcome: Progressing Goal: Will remain free from infection Outcome: Progressing Goal: Diagnostic test results will improve Outcome: Progressing Goal: Respiratory complications will improve Outcome: Progressing Goal: Cardiovascular complication will be avoided Outcome: Progressing   Problem: Activity: Goal: Risk for activity intolerance will decrease Outcome: Progressing   Problem: Nutrition: Goal: Adequate nutrition will be maintained Outcome: Progressing   Problem: Coping: Goal: Level of anxiety will decrease Outcome: Progressing   Problem: Elimination: Goal: Will not experience complications related to bowel motility Outcome: Progressing Goal: Will not experience complications related to urinary retention Outcome: Progressing   Problem: Pain Managment: Goal: General experience of comfort will improve and/or be controlled Outcome: Progressing   Problem: Safety: Goal: Ability to remain free from injury will improve Outcome: Progressing   Problem: Skin Integrity: Goal: Risk for impaired skin integrity will decrease Outcome: Progressing   Problem: Education: Goal: Ability to describe self-care measures that may prevent or decrease complications (Diabetes Survival Skills Education) will improve Outcome: Progressing Goal: Individualized Educational Video(s) Outcome: Progressing   Problem: Coping: Goal: Ability to adjust to condition or change in health will improve Outcome: Progressing   Problem: Fluid  Volume: Goal: Ability to maintain a balanced intake and output will improve Outcome: Progressing   Problem: Health Behavior/Discharge Planning: Goal: Ability to identify and utilize available resources and services will improve Outcome: Progressing Goal: Ability to manage health-related needs will improve Outcome: Progressing   Problem: Metabolic: Goal: Ability to maintain appropriate glucose levels will improve Outcome: Progressing   Problem: Nutritional: Goal: Maintenance of adequate nutrition will improve Outcome: Progressing Goal: Progress toward achieving an optimal weight will improve Outcome: Progressing  Problem: Tissue Perfusion: Goal: Adequacy of tissue perfusion will improve Outcome: Progressing

## 2023-11-28 LAB — GLUCOSE, CAPILLARY
Glucose-Capillary: 148 mg/dL — ABNORMAL HIGH (ref 70–99)
Glucose-Capillary: 190 mg/dL — ABNORMAL HIGH (ref 70–99)
Glucose-Capillary: 198 mg/dL — ABNORMAL HIGH (ref 70–99)
Glucose-Capillary: 177 mg/dL — ABNORMAL HIGH (ref 70–99)

## 2023-11-28 MED ORDER — LIDOCAINE HCL 1 % IJ SOLN
5.0000 mL | Freq: Once | INTRAMUSCULAR | Status: AC
  Administered 2023-11-28: 5 mL via INTRADERMAL
  Filled 2023-11-28: qty 5

## 2023-11-28 MED ORDER — SULFAMETHOXAZOLE-TRIMETHOPRIM 800-160 MG PO TABS
1.0000 | ORAL_TABLET | Freq: Two times a day (BID) | ORAL | Status: DC
  Administered 2023-11-28 – 2023-12-01 (×6): 1 via ORAL
  Filled 2023-11-28 (×8): qty 1

## 2023-11-28 NOTE — Progress Notes (Signed)
 PROGRESS NOTE    [name]   FMW:[id number] DOB: [birth year]  DOA: 11/11/2023 Date of Service: 11/28/23 which is hospital day 15  PCP: Valma Carwin, MD    Hospital course / significant events:   HPI: [name] is a 68 year old female with pulmonary hypertension, hypertension, atrial fibrillation on Eliquis , history of DVT/PE with IVC, heart failure preserved ejection fraction, morbid obesity, CKD stage IV, chronic respiratory failure requiring 2 L nasal cannula at baseline, anxiety, insulin -dependent diabetes mellitus, who presents emergency department on 11/02/2023 for chief concerns of intentional overdose of OxyContin  on 11/01/2023.  08/01: admitted to behavioral health service on 11/06/2023 for the overdose attempt / SI. 08/06: hospitalist consulted for intractable N/V. Vitals at the time of hospitalist consultation showed temperature of 98.2, respiration rate 20, heart rate 50, blood pressure 163/60, SpO2 of 97% on 2 L nasal cannula. She also complained of constipation for about 2 weeks. Patient was also found to have weeping edema from the abdomen and weeping edema from left leg with bloody drainage soaking through left Unna boot. Admitted to hospitalist  08/07: palliative care consulted to clarify code status. DNR felt appropriate. Psychiatry also saw patient, no concerns on their end - continue Celexa  and Abilify , no psychiatric contraindications to discharge at this time  08/08: weeping edema from abdomen. Left Unna boot with visible oozing blood soaking through. Started IV Lasix  + albumin .  08/09-08/17: planning DC to SNF, placement pending and has been difficult to secure  08/18: psychiatry reevaluation - pt is cleared from their standpoint and no need for psychiatric admission.  08/19-08/22: placement pending, bed offer was withdrawn from planned facility  08/22-08/23: ok for placement [facility] pending auth      Consultants:  Psychiatry Palliative  Care Nephrology   Procedures/Surgeries: 08/23: bedside I&D small R axillary abscess       ASSESSMENT & PLAN:   Acute on Chronic HFpEF - improved  Recent echo 08/2023 noted EF 60-65%, moderate LVH, normal RV. Recent R/LHC noted no significant CAD, PA pressure 62/16, wedge of 11, PVR 4.4, group 3 pulmonary hypertension suspected Assoc w/ anasarca, ascites, peripheral edema Diuresis - caution w/ renal function Monitor BMP Monitor daily weight Torsemide  to restart     CKD (chronic kidney disease) stage 4, GFR 15-29 ml/min Baseline creatinine around 1.6-2  sCr 1.73>>2.21>>2.31 Monitor BMP with diuresis Nephrology following - continue follow outpatient    R axillary abscess, mild  See below procedure note I&D at bedside Bactrim    Chronic Lymphedema Venous stasis ulcers of left lower extremity  She had weeping edema (bloody drainage) from left leg. Continue Unna boots on bilateral lower extremities.Pt will most likely need these chronically as edema / skin weeping recurs with each removal   Intractable nausea and vomiting - resolved Suspect due to severe constipation, had not had BM in 2 weeks. Bowel regimen antiemetics as needed Monitor BMP Continue PPI Of note, iron  supplement potentially causing N/V, pt has done fine w/ switch to Niferex so will continue this    Type 2 diabetes mellitus with long-term current use of insulin  Continue insulin  per orders   Paroxysmal atrial fibrillation, bradycardia HR in the 50s not on rate control drugs Continue Eliquis   Hx UTI, Urine culture from 05/05/2023: Showed Enterobacter cloacae, pansensitive UA with only trace leukocytes, rare bacteria, 0-5 WBC's -- not consistent with UTI. Prior to admission patient was started on Keflex  so completed course for total 5 days. Recheck as needed  Hyperkalemia Lokelma  given on  8/14.  Resolved Monitor BMP    Hypothyroid TSH 14.9 elevated, increased dose of Synthroid  from 75 to 100 mcg p.o.  daily 8/15 Follow-up outpatient to repeat TSH level after 6 weeks.   Drug overdose, intentional, initial encounter  Major depressive disorder, anxiety Continue Celexa  and Abilify  Pt was initially admitted to medical floor from geropsych unit. One-on-one suicide precautions were lifted per psychiatry on 11/12/23. Per psychiatry, pt can be discharged when medically stable, no longer requires psych admission.   OHS/OSA Pulmonary HTN type III Doesn't tolerate CPAP Supplement O2 as needed Weight loss encouraged     History of DVT and PE s/p IVC filter  Eliquis    Hypertension Low diastolic BP Continue amlodipine  Daily vital signs   Anemia of chronic disease Iron  profile, B12 within normal range Monitor H&H/CBC Of note, iron  supplement was causing N/V, pt has done fine w/ switch to Niferex so will continue this    Intertrigo, abdominal folds Lotrisone  cream twice daily followed by nystatin  powder Applied Lotrisone  cream on bilateral lower extremities well for possible dermatitis associated with chronic venous stasis/lymphedema Continue Lotrisone  for at least 2-4 weeks    Super Morbid Obesity based on BMI: Body mass index is 58.73 kg/m.SABRA Significantly low or high BMI is associated with higher medical risk.  Underweight - under 18  overweight - 25 to 29 obese - 30 or more Class 1 obesity: BMI of 30.0 to 34 Class 2 obesity: BMI of 35.0 to 39 Class 3 obesity: BMI of 40.0 to 49 Super Morbid Obesity: BMI 50-59 Super-super Morbid Obesity: BMI 60+ Healthy nutrition and physical activity advised as adjunct to other disease management and risk reduction treatments    DVT prophylaxis: eliquis  IV fluids: no continuous IV fluids  Nutrition: carb modified diet Central lines / other devices: none  Code Status: DNR ACP documentation reviewed: DNR order, MOST and advanced directive are on file in [facility]  [facility] needs: placement Medical barriers to dispo: none.               Subjective / Brief ROS:  Patient reports red area under her arm is more sore today, has been using the warm compresses and boil seems a bit larger / more swollen   Family Communication: none at this time     Objective Findings:  Vitals:   11/27/23 1625 11/27/23 2028 11/28/23 0350 11/28/23 0822  BP: (!) 130/43 (!) 137/48 (!) 143/60 (!) 143/54  Pulse: (!) 49 (!) 50 (!) 50 (!) 50  Resp: 20 17 18 20   Temp: 98.1 F (36.7 C) 97.8 F (36.6 C) 98.5 F (36.9 C) 98.6 F (37 C)  TempSrc:      SpO2: 100% 100% 99% 99%  Weight:      Height:        Intake/Output Summary (Last 24 hours) at 11/28/2023 1449 Last data filed at 11/28/2023 1300 Gross per 24 hour  Intake 720 ml  Output --  Net 720 ml   Filed Weights   11/24/23 0500 11/25/23 0500 11/26/23 0500  Weight: (!) 138.9 kg (!) 141 kg (!) 140.3 kg    Examination:  Physical Exam Constitutional:      General: She is not in acute distress.    Appearance: She is obese.  Cardiovascular:     Rate and Rhythm: Normal rate.  Pulmonary:     Effort: Pulmonary effort is normal.  Abdominal:     Palpations: Abdomen is soft.  Skin:    Comments: See procedure note  Neurological:     Mental Status: Mental status is at baseline.          Scheduled Medications:   allopurinol   50 mg Oral QODAY   amLODipine   10 mg Oral Daily   apixaban   5 mg Oral BID   ARIPiprazole   2 mg Oral QHS   citalopram   40 mg Oral QHS   clotrimazole -betamethasone    Topical BID   feeding supplement  237 mL Oral BID BM   insulin  aspart  0-15 Units Subcutaneous TID WC   insulin  aspart  0-5 Units Subcutaneous QHS   insulin  glargine-yfgn  20 Units Subcutaneous Daily   iron  polysaccharides  150 mg Oral Daily   levothyroxine   100 mcg Oral QAC breakfast   melatonin  10 mg Oral QHS   multivitamin with minerals  1 tablet Oral Daily   nystatin    Topical BID   pantoprazole   40 mg Oral Daily   polyethylene glycol  17 g Oral Daily    rosuvastatin   10 mg Oral Daily   senna-docusate  1 tablet Oral BID   sulfamethoxazole -trimethoprim   1 tablet Oral Q12H   torsemide   40 mg Oral BID    Continuous Infusions:   PRN Medications:  alum & mag hydroxide-simeth, diazepam , guaiFENesin -dextromethorphan , loratadine , neomycin -bacitracin -polymyxin, ondansetron  (ZOFRAN ) IV, mouth rinse, oxyCODONE   Antimicrobials from admission:  Anti-infectives (From admission, onward)    Start     Dose/Rate Route Frequency Ordered Stop   11/28/23 1545  sulfamethoxazole -trimethoprim  (BACTRIM  DS) 800-160 MG per tablet 1 tablet        1 tablet Oral Every 12 hours 11/28/23 1448 12/03/23 0959           Data Reviewed:  I have personally reviewed the following...  CBC: Recent Labs  Lab 11/22/23 0609 11/23/23 0542 11/24/23 0353 11/25/23 0630 11/26/23 0355  WBC 6.5 6.7 6.6 6.4 6.6  HGB 7.8* 7.8* 7.9* 7.9* 8.1*  HCT 25.8* 26.7* 26.2* 26.9* 26.9*  MCV 104.0* 105.5* 104.0* 105.1* 105.1*  PLT 124* 134* 135* 131* 136*   Basic Metabolic Panel: Recent Labs  Lab 11/22/23 0609 11/23/23 0542 11/24/23 0353 11/25/23 0630 11/26/23 0355  NA 139 141 138 142 141  K 3.8 3.6 4.0 3.7 3.6  CL 98 98 97* 100 99  CO2 32 32 32 32 32  GLUCOSE 122* 126* 169* 130* 140*  BUN 62* 62* 65* 64* 64*  CREATININE 2.21* 2.11* 2.31* 2.34* 2.33*  CALCIUM  8.7* 8.9 9.0 9.0 8.8*   GFR: Estimated Creatinine Clearance: 31.4 mL/min (A) (by C-G formula based on SCr of 2.33 mg/dL (H)). Liver Function Tests: No results for input(s): AST, ALT, ALKPHOS, BILITOT, PROT, ALBUMIN  in the last 168 hours. No results for input(s): LIPASE, AMYLASE in the last 168 hours. No results for input(s): AMMONIA in the last 168 hours. Coagulation Profile: No results for input(s): INR, PROTIME in the last 168 hours. Cardiac Enzymes: No results for input(s): CKTOTAL, CKMB, CKMBINDEX, TROPONINI in the last 168 hours. BNP (last 3 results) No results for  input(s): PROBNP in the last 8760 hours. HbA1C: No results for input(s): HGBA1C in the last 72 hours. CBG: Recent Labs  Lab 11/27/23 1152 11/27/23 1714 11/27/23 2026 11/28/23 0822 11/28/23 1206  GLUCAP 137* 169* 219* 148* 190*   Lipid Profile: No results for input(s): CHOL, HDL, LDLCALC, TRIG, CHOLHDL, LDLDIRECT in the last 72 hours. Thyroid  Function Tests: No results for input(s): TSH, T4TOTAL, FREET4, T3FREE, THYROIDAB in the last 72 hours. Anemia Panel: No results for input(s): VITAMINB12,  FOLATE, FERRITIN, TIBC, IRON , RETICCTPCT in the last 72 hours. Most Recent Urinalysis On File:     Component Value Date/Time   COLORURINE YELLOW (A) 11/12/2023 0244   APPEARANCEUR CLEAR (A) 11/12/2023 0244   LABSPEC 1.010 11/12/2023 0244   PHURINE 5.0 11/12/2023 0244   GLUCOSEU NEGATIVE 11/12/2023 0244   HGBUR NEGATIVE 11/12/2023 0244   BILIRUBINUR NEGATIVE 11/12/2023 0244   KETONESUR NEGATIVE 11/12/2023 0244   PROTEINUR >=300 (A) 11/12/2023 0244   UROBILINOGEN 0.2 08/31/2014 1158   NITRITE NEGATIVE 11/12/2023 0244   LEUKOCYTESUR TRACE (A) 11/12/2023 0244   Sepsis Labs: @LABRCNTIP (procalcitonin:4,lacticidven:4) Microbiology: No results found for this or any previous visit (from the past 240 hours).    Radiology Studies last 3 days: No results found.     Procedure note I&D done 11/28/23  PRE-OP  DIAGNOSIS: Abscess of R axilla  POST-OP DIAGNOSIS: Same  PROCEDURE: incision and drainage of abscess Performing Physician: Marsa PROCEDURE:  Patient informed consent obtained verbally after discussion of risks (including but not limited to pain, infection, bleeding, damage to surrounding tissues, incomplete evacuation/treatment of infection, recurrence)  and benefits (adequate treatment and diagnosis, relief of pain). All questions answered prior to procedure.  A timeout protocol was performed prior to initiating the procedure.  The area was  cleans and site was anesthetized with 1 cc 1% lidocaine . A linear incision along the local skin lines was made and the purulent material expressed. The abcess was explored thoroughly and sequestered pockets were not found. Bleeding was minimal.  Packing: none Followup: The patient tolerated the procedure well, continue warm compresses and po abx       Laneta Marsa, DO [facility] 11/28/2023, 2:49 PM    Dictation software may have been used to generate the above note. Typos may occur and escape review in typed/dictated notes. Please contact Dr Marsa directly for clarity if needed.  Staff may message me via secure chat in Epic  but this may not receive an immediate response,  please page me for urgent matters!  If 7PM-7AM, please contact night coverage [email or web address]

## 2023-11-28 NOTE — Plan of Care (Signed)

## 2023-11-29 LAB — CBC
HCT: 26.8 % — ABNORMAL LOW (ref 36.0–46.0)
Hemoglobin: 8 g/dL — ABNORMAL LOW (ref 12.0–15.0)
MCH: 30.8 pg (ref 26.0–34.0)
MCHC: 29.9 g/dL — ABNORMAL LOW (ref 30.0–36.0)
MCV: 103.1 fL — ABNORMAL HIGH (ref 80.0–100.0)
Platelets: 127 K/uL — ABNORMAL LOW (ref 150–400)
RBC: 2.6 MIL/uL — ABNORMAL LOW (ref 3.87–5.11)
RDW: 15.6 % — ABNORMAL HIGH (ref 11.5–15.5)
WBC: 7.6 K/uL (ref 4.0–10.5)
nRBC: 0 % (ref 0.0–0.2)

## 2023-11-29 LAB — BASIC METABOLIC PANEL WITH GFR
Anion gap: 10 (ref 5–15)
BUN: 86 mg/dL — ABNORMAL HIGH (ref 8–23)
CO2: 32 mmol/L (ref 22–32)
Calcium: 8.7 mg/dL — ABNORMAL LOW (ref 8.9–10.3)
Chloride: 96 mmol/L — ABNORMAL LOW (ref 98–111)
Creatinine, Ser: 2.64 mg/dL — ABNORMAL HIGH (ref 0.44–1.00)
GFR, Estimated: 19 mL/min — ABNORMAL LOW (ref >60)
Glucose, Bld: 122 mg/dL — ABNORMAL HIGH (ref 70–99)
Potassium: 3.9 mmol/L (ref 3.5–5.1)
Sodium: 138 mmol/L (ref 135–145)

## 2023-11-29 LAB — GLUCOSE, CAPILLARY
Glucose-Capillary: 114 mg/dL — ABNORMAL HIGH (ref 70–99)
Glucose-Capillary: 162 mg/dL — ABNORMAL HIGH (ref 70–99)
Glucose-Capillary: 191 mg/dL — ABNORMAL HIGH (ref 70–99)
Glucose-Capillary: 183 mg/dL — ABNORMAL HIGH (ref 70–99)

## 2023-11-29 MED ORDER — CHLORHEXIDINE GLUCONATE CLOTH 2 % EX PADS
6.0000 | MEDICATED_PAD | Freq: Every day | CUTANEOUS | Status: DC
  Administered 2023-11-29 – 2023-12-06 (×7): 6 via TOPICAL

## 2023-11-29 MED ORDER — SODIUM CHLORIDE 0.9 % IV SOLN
12.5000 mg | Freq: Four times a day (QID) | INTRAVENOUS | Status: DC | PRN
  Administered 2023-12-01: 12.5 mg via INTRAVENOUS
  Filled 2023-11-29: qty 12.5

## 2023-11-29 NOTE — Progress Notes (Signed)
 PROGRESS NOTE    [name]   FMW:[id number] DOB: 09/04/1955  DOA: 11/11/2023 Date of Service: 11/29/23 which is hospital day 16  PCP: Valma Carwin, MD    Hospital course / significant events:   HPI: [name] is a 68 year old female with pulmonary hypertension, hypertension, atrial fibrillation on Eliquis , history of DVT/PE with IVC, heart failure preserved ejection fraction, morbid obesity, CKD stage IV, chronic respiratory failure requiring 2 L nasal cannula at baseline, anxiety, insulin -dependent diabetes mellitus, who presents emergency department on 11/02/2023 for chief concerns of intentional overdose of OxyContin  on 11/01/2023.  08/01: admitted to behavioral health service on 11/06/2023 for the overdose attempt / SI. 08/06: hospitalist consulted for intractable N/V. Vitals at the time of hospitalist consultation showed temperature of 98.2, respiration rate 20, heart rate 50, blood pressure 163/60, SpO2 of 97% on 2 L nasal cannula. She also complained of constipation for about 2 weeks. Patient was also found to have weeping edema from the abdomen and weeping edema from left leg with bloody drainage soaking through left Unna boot. Admitted to hospitalist  08/07: palliative care consulted to clarify code status. DNR felt appropriate. Psychiatry also saw patient, no concerns on their end - continue Celexa  and Abilify , no psychiatric contraindications to discharge at this time  08/08: weeping edema from abdomen. Left Unna boot with visible oozing blood soaking through. Started IV Lasix  + albumin .  08/09-08/17: planning DC to SNF, placement pending and has been difficult to secure  08/18: psychiatry reevaluation - pt is cleared from their standpoint and no need for psychiatric admission.  08/19-08/22: placement pending, bed offer was withdrawn from planned facility  08/22-08/24: ok for placement [facility] pending auth      Consultants:  Psychiatry Palliative  Care Nephrology   Procedures/Surgeries: 08/23: bedside I&D small R axillary abscess       ASSESSMENT & PLAN:   Acute on Chronic HFpEF - improved  Recent echo 08/2023 noted EF 60-65%, moderate LVH, normal RV. Recent R/LHC noted no significant CAD, PA pressure 62/16, wedge of 11, PVR 4.4, group 3 pulmonary hypertension suspected Assoc w/ anasarca, ascites, peripheral edema Diuresis - caution w/ renal function Monitor BMP Monitor daily weight Torsemide  to restart     CKD (chronic kidney disease) stage 4, GFR 15-29 ml/min Baseline creatinine around 1.6-2  sCr 1.73>>2.21>>2.31 Monitor BMP with diuresis Nephrology following - continue follow outpatient    R axillary abscess, mild  See below procedure note I&D at bedside Bactrim    Chronic Lymphedema Venous stasis ulcers of left lower extremity  She had weeping edema (bloody drainage) from left leg. Continue Unna boots on bilateral lower extremities.Pt will most likely need these chronically as edema / skin weeping recurs with each removal   Intractable nausea and vomiting - resolved Suspect due to severe constipation, had not had BM in 2 weeks. Bowel regimen antiemetics as needed Monitor BMP Continue PPI Of note, iron  supplement potentially causing N/V, pt has done fine w/ switch to Niferex so will continue this    Type 2 diabetes mellitus with long-term current use of insulin  Continue insulin  per orders   Paroxysmal atrial fibrillation, bradycardia HR in the 50s not on rate control drugs Continue Eliquis   Hx UTI, Urine culture from 05/05/2023: Showed Enterobacter cloacae, pansensitive UA with only trace leukocytes, rare bacteria, 0-5 WBC's -- not consistent with UTI. Prior to admission patient was started on Keflex  so completed course for total 5 days. Recheck as needed  Hyperkalemia Lokelma  given on  8/14.  Resolved Monitor BMP    Hypothyroid TSH 14.9 elevated, increased dose of Synthroid  from 75 to 100 mcg p.o.  daily 8/15 Follow-up outpatient to repeat TSH level after 6 weeks.   Drug overdose, intentional, initial encounter  Major depressive disorder, anxiety Continue Celexa  and Abilify  Pt was initially admitted to medical floor from geropsych unit. One-on-one suicide precautions were lifted per psychiatry on 11/12/23. Per psychiatry, pt can be discharged when medically stable, no longer requires psych admission.   OHS/OSA Pulmonary HTN type III Doesn't tolerate CPAP Supplement O2 as needed Weight loss encouraged     History of DVT and PE s/p IVC filter  Eliquis    Hypertension Low diastolic BP Continue amlodipine  Daily vital signs   Anemia of chronic disease Iron  profile, B12 within normal range Monitor H&H/CBC Of note, iron  supplement was causing N/V, pt has done fine w/ switch to Niferex so will continue this    Intertrigo, abdominal folds Lotrisone  cream twice daily followed by nystatin  powder Applied Lotrisone  cream on bilateral lower extremities well for possible dermatitis associated with chronic venous stasis/lymphedema Continue Lotrisone  for at least 2-4 weeks    Super Morbid Obesity based on BMI: Body mass index is 58.73 kg/m.SABRA Significantly low or high BMI is associated with higher medical risk.  Underweight - under 18  overweight - 25 to 29 obese - 30 or more Class 1 obesity: BMI of 30.0 to 34 Class 2 obesity: BMI of 35.0 to 39 Class 3 obesity: BMI of 40.0 to 49 Super Morbid Obesity: BMI 50-59 Super-super Morbid Obesity: BMI 60+ Healthy nutrition and physical activity advised as adjunct to other disease management and risk reduction treatments    DVT prophylaxis: eliquis  IV fluids: no continuous IV fluids  Nutrition: carb modified diet Central lines / other devices: none  Code Status: DNR ACP documentation reviewed: DNR order, MOST and advanced directive are on file in [facility]  [facility] needs: placement Medical barriers to dispo: none.               Subjective / Brief ROS:  Patient reports arm is better today, no fever/chills, otherwise no complaints today    Family Communication: none at this time     Objective Findings:  Vitals:   11/28/23 1946 11/29/23 0402 11/29/23 0500 11/29/23 0752  BP: 132/64 (!) 130/51  (!) 148/48  Pulse: (!) 49 (!) 50  (!) 50  Resp:    20  Temp: 98.2 F (36.8 C) 98.2 F (36.8 C)  98.7 F (37.1 C)  TempSrc: Oral Oral  Oral  SpO2: 100% 99%  100%  Weight:   (!) 141.9 kg   Height:        Intake/Output Summary (Last 24 hours) at 11/29/2023 1441 Last data filed at 11/29/2023 0900 Gross per 24 hour  Intake 600 ml  Output 250 ml  Net 350 ml   Filed Weights   11/25/23 0500 11/26/23 0500 11/29/23 0500  Weight: (!) 141 kg (!) 140.3 kg (!) 141.9 kg    Examination:  Physical Exam Constitutional:      General: She is not in acute distress.    Appearance: She is obese.  Cardiovascular:     Rate and Rhythm: Normal rate.  Pulmonary:     Effort: Pulmonary effort is normal.  Abdominal:     Palpations: Abdomen is soft.  Neurological:     Mental Status: She is alert. Mental status is at baseline.          Scheduled  Medications:   allopurinol   50 mg Oral QODAY   amLODipine   10 mg Oral Daily   apixaban   5 mg Oral BID   ARIPiprazole   2 mg Oral QHS   Chlorhexidine  Gluconate Cloth  6 each Topical Daily   citalopram   40 mg Oral QHS   clotrimazole -betamethasone    Topical BID   feeding supplement  237 mL Oral BID BM   insulin  aspart  0-15 Units Subcutaneous TID WC   insulin  aspart  0-5 Units Subcutaneous QHS   insulin  glargine-yfgn  20 Units Subcutaneous Daily   iron  polysaccharides  150 mg Oral Daily   levothyroxine   100 mcg Oral QAC breakfast   melatonin  10 mg Oral QHS   multivitamin with minerals  1 tablet Oral Daily   nystatin    Topical BID   pantoprazole   40 mg Oral Daily   polyethylene glycol  17 g Oral Daily   rosuvastatin   10 mg Oral Daily    senna-docusate  1 tablet Oral BID   sulfamethoxazole -trimethoprim   1 tablet Oral Q12H   torsemide   40 mg Oral BID    Continuous Infusions:   PRN Medications:  alum & mag hydroxide-simeth, diazepam , guaiFENesin -dextromethorphan , loratadine , neomycin -bacitracin -polymyxin, ondansetron  (ZOFRAN ) IV, mouth rinse, oxyCODONE   Antimicrobials from admission:  Anti-infectives (From admission, onward)    Start     Dose/Rate Route Frequency Ordered Stop   11/28/23 2000  sulfamethoxazole -trimethoprim  (BACTRIM  DS) 800-160 MG per tablet 1 tablet        1 tablet Oral Every 12 hours 11/28/23 1448 12/03/23 2159           Data Reviewed:  I have personally reviewed the following...  CBC: Recent Labs  Lab 11/23/23 0542 11/24/23 0353 11/25/23 0630 11/26/23 0355 11/29/23 0857  WBC 6.7 6.6 6.4 6.6 7.6  HGB 7.8* 7.9* 7.9* 8.1* 8.0*  HCT 26.7* 26.2* 26.9* 26.9* 26.8*  MCV 105.5* 104.0* 105.1* 105.1* 103.1*  PLT 134* 135* 131* 136* 127*   Basic Metabolic Panel: Recent Labs  Lab 11/23/23 0542 11/24/23 0353 11/25/23 0630 11/26/23 0355 11/29/23 0857  NA 141 138 142 141 138  K 3.6 4.0 3.7 3.6 3.9  CL 98 97* 100 99 96*  CO2 32 32 32 32 32  GLUCOSE 126* 169* 130* 140* 122*  BUN 62* 65* 64* 64* 86*  CREATININE 2.11* 2.31* 2.34* 2.33* 2.64*  CALCIUM  8.9 9.0 9.0 8.8* 8.7*   GFR: Estimated Creatinine Clearance: 27.9 mL/min (A) (by C-G formula based on SCr of 2.64 mg/dL (H)). Liver Function Tests: No results for input(s): AST, ALT, ALKPHOS, BILITOT, PROT, ALBUMIN  in the last 168 hours. No results for input(s): LIPASE, AMYLASE in the last 168 hours. No results for input(s): AMMONIA in the last 168 hours. Coagulation Profile: No results for input(s): INR, PROTIME in the last 168 hours. Cardiac Enzymes: No results for input(s): CKTOTAL, CKMB, CKMBINDEX, TROPONINI in the last 168 hours. BNP (last 3 results) No results for input(s): PROBNP in the last 8760  hours. HbA1C: No results for input(s): HGBA1C in the last 72 hours. CBG: Recent Labs  Lab 11/28/23 1206 11/28/23 1725 11/28/23 2127 11/29/23 0753 11/29/23 1227  GLUCAP 190* 198* 177* 114* 162*   Lipid Profile: No results for input(s): CHOL, HDL, LDLCALC, TRIG, CHOLHDL, LDLDIRECT in the last 72 hours. Thyroid  Function Tests: No results for input(s): TSH, T4TOTAL, FREET4, T3FREE, THYROIDAB in the last 72 hours. Anemia Panel: No results for input(s): VITAMINB12, FOLATE, FERRITIN, TIBC, IRON , RETICCTPCT in the last 72 hours. Most Recent  Urinalysis On File:     Component Value Date/Time   COLORURINE YELLOW (A) 11/12/2023 0244   APPEARANCEUR CLEAR (A) 11/12/2023 0244   LABSPEC 1.010 11/12/2023 0244   PHURINE 5.0 11/12/2023 0244   GLUCOSEU NEGATIVE 11/12/2023 0244   HGBUR NEGATIVE 11/12/2023 0244   BILIRUBINUR NEGATIVE 11/12/2023 0244   KETONESUR NEGATIVE 11/12/2023 0244   PROTEINUR >=300 (A) 11/12/2023 0244   UROBILINOGEN 0.2 08/31/2014 1158   NITRITE NEGATIVE 11/12/2023 0244   LEUKOCYTESUR TRACE (A) 11/12/2023 0244   Sepsis Labs: @LABRCNTIP (procalcitonin:4,lacticidven:4) Microbiology: No results found for this or any previous visit (from the past 240 hours).    Radiology Studies last 3 days: No results found.     Procedure note I&D done 11/29/23  PRE-OP  DIAGNOSIS: Abscess of R axilla  POST-OP DIAGNOSIS: Same  PROCEDURE: incision and drainage of abscess Performing Physician: Marsa PROCEDURE:  Patient informed consent obtained verbally after discussion of risks (including but not limited to pain, infection, bleeding, damage to surrounding tissues, incomplete evacuation/treatment of infection, recurrence)  and benefits (adequate treatment and diagnosis, relief of pain). All questions answered prior to procedure.  A timeout protocol was performed prior to initiating the procedure.  The area was cleans and site was anesthetized with  1 cc 1% lidocaine . A linear incision along the local skin lines was made and the purulent material expressed. The abcess was explored thoroughly and sequestered pockets were not found. Bleeding was minimal.  Packing: none Followup: The patient tolerated the procedure well, continue warm compresses and po abx       Laneta Marsa, DO [facility] 11/29/2023, 2:41 PM    Dictation software may have been used to generate the above note. Typos may occur and escape review in typed/dictated notes. Please contact Dr Marsa directly for clarity if needed.  Staff may message me via secure chat in Epic  but this may not receive an immediate response,  please page me for urgent matters!  If 7PM-7AM, please contact night coverage [email or web address]

## 2023-11-29 NOTE — Plan of Care (Signed)

## 2023-11-29 NOTE — Plan of Care (Signed)
 Review of chart for [name], remains stable without acute palliative needs, awaiting insurance authorization and pending placement. Primary team confirms no ongoing acute palliative needs at this time, PMT may sign off.  No Charge.  Waddell Lesches, DNP, AGNP-C Palliative Medicine  Please call Palliative Medicine team phone with any questions [phone]. For individual providers please see AMION.

## 2023-11-29 NOTE — Plan of Care (Signed)
  Problem: Education: Goal: Knowledge of General Education information will improve Description: Including pain rating scale, medication(s)/side effects and non-pharmacologic comfort measures Outcome: Progressing   Problem: Health Behavior/Discharge Planning: Goal: Ability to manage health-related needs will improve Outcome: Progressing   Problem: Clinical Measurements: Goal: Will remain free from infection Outcome: Progressing   Problem: Activity: Goal: Risk for activity intolerance will decrease Outcome: Progressing   Problem: Nutrition: Goal: Adequate nutrition will be maintained Outcome: Progressing   Problem: Coping: Goal: Level of anxiety will decrease Outcome: Progressing   Problem: Elimination: Goal: Will not experience complications related to urinary retention Outcome: Progressing   Problem: Pain Managment: Goal: General experience of comfort will improve and/or be controlled Outcome: Progressing   Problem: Safety: Goal: Ability to remain free from injury will improve Outcome: Progressing   Problem: Education: Goal: Ability to describe self-care measures that may prevent or decrease complications (Diabetes Survival Skills Education) will improve Outcome: Progressing   Problem: Fluid Volume: Goal: Ability to maintain a balanced intake and output will improve Outcome: Progressing

## 2023-11-30 ENCOUNTER — Inpatient Hospital Stay

## 2023-11-30 DIAGNOSIS — L02411 Cutaneous abscess of right axilla: Secondary | ICD-10-CM

## 2023-11-30 LAB — GLUCOSE, CAPILLARY
Glucose-Capillary: 144 mg/dL — ABNORMAL HIGH (ref 70–99)
Glucose-Capillary: 214 mg/dL — ABNORMAL HIGH (ref 70–99)
Glucose-Capillary: 167 mg/dL — ABNORMAL HIGH (ref 70–99)
Glucose-Capillary: 185 mg/dL — ABNORMAL HIGH (ref 70–99)

## 2023-11-30 NOTE — Progress Notes (Signed)
 PT Cancellation Note  Patient Details Name: [name] MRN: [id number] DOB: September 06, 1955   Cancelled Treatment:    Reason Eval/Treat Not Completed: Other (comment). Upon entry to room pt declining PT. Pt with multiple complaints of significant fatigue, LE's weeping and significant SOB. Requesting to hold PT and re-attempt another day. PT to re-attempt as available and medically appropriate.    Dorina HERO. Fairly IV, PT, DPT Physical Therapist- [place]  [facility] 11/30/2023, 11:17 AM

## 2023-11-30 NOTE — Progress Notes (Addendum)
 PROGRESS NOTE    [name]   FMW:[id number] DOB: [birth year]  DOA: 11/11/2023 Date of Service: 11/30/23 which is hospital day 17  PCP: Valma Carwin, MD    Hospital course / significant events:   HPI: [name] is a 68 year old female with pulmonary hypertension, hypertension, atrial fibrillation on Eliquis , history of DVT/PE with IVC, heart failure preserved ejection fraction, morbid obesity, CKD stage IV, chronic respiratory failure requiring 2 L nasal cannula at baseline, anxiety, insulin -dependent diabetes mellitus, who presents emergency department on 11/02/2023 for chief concerns of intentional overdose of OxyContin  on 11/01/2023.  08/01: admitted to behavioral health service on 11/06/2023 for the overdose attempt / SI. 08/06: hospitalist consulted for intractable N/V. Vitals at the time of hospitalist consultation showed temperature of 98.2, respiration rate 20, heart rate 50, blood pressure 163/60, SpO2 of 97% on 2 L nasal cannula. She also complained of constipation for about 2 weeks. Patient was also found to have weeping edema from the abdomen and weeping edema from left leg with bloody drainage soaking through left Unna boot. Admitted to hospitalist  08/07: palliative care consulted to clarify code status. DNR felt appropriate. Psychiatry also saw patient, no concerns on their end - continue Celexa  and Abilify , no psychiatric contraindications to discharge at this time  08/08: weeping edema from abdomen. Left Unna boot with visible oozing blood soaking through. Started IV Lasix  + albumin .  08/09-08/17: planning DC to SNF, placement pending and has been difficult to secure  08/18: psychiatry reevaluation - pt is cleared from their standpoint and no need for psychiatric admission.  08/19-08/22: placement pending, bed offer was withdrawn from planned facility  08/23: bedisde I&D by hospitalist for abscess R axilla 08/24: ok for placement [facility] pending auth  08/25: pt  reporting a bit more soreness today in R axilla - US  to evaluate in case gen surg needs to do more extensive I&D --> US  concerning for abscess, gen surg notified and Dr Desiderio will evaluate / possible to OR tomorrow if needed      Consultants:  Psychiatry Palliative Care Nephrology   Procedures/Surgeries: 08/23: bedside I&D small R axillary abscess       ASSESSMENT & PLAN:   Acute on Chronic HFpEF - improved  Recent echo 08/2023 noted EF 60-65%, moderate LVH, normal RV. Recent R/LHC noted no significant CAD, PA pressure 62/16, wedge of 11, PVR 4.4, group 3 pulmonary hypertension suspected Assoc w/ anasarca, ascites, peripheral edema Diuresis - caution w/ renal function Monitor BMP Monitor daily weight Torsemide  to restart     CKD (chronic kidney disease) stage 4, GFR 15-29 ml/min Baseline creatinine around 1.6-2  sCr 1.73>>2.21>>2.31 Monitor BMP with diuresis Nephrology following - continue follow outpatient    R axillary abscess, mild, not resolving/worsening 08/25 Question abscess extension deeper versus enlarged lymph node See procedure note I&D at bedside 08/23 Bactrim   US  concerning for abscess --> gen surg notified and Dr Desiderio will evaluate / possible to OR tomorrow if needed   Chronic Lymphedema Venous stasis ulcers of left lower extremity  She had weeping edema (bloody drainage) from left leg. Continue Unna boots on bilateral lower extremities.Pt will most likely need these chronically as edema / skin weeping recurs with each removal   Intractable nausea and vomiting - resolved Suspect due to severe constipation, had not had BM in 2 weeks. Bowel regimen antiemetics as needed Monitor BMP Continue PPI Of note, iron  supplement potentially causing N/V, pt has done fine w/ switch to Niferex  so will continue this    Type 2 diabetes mellitus with long-term current use of insulin  Continue insulin  per orders   Paroxysmal atrial fibrillation, bradycardia HR  in the 50s not on rate control drugs Continue Eliquis   Hx UTI, Urine culture from 05/05/2023: Showed Enterobacter cloacae, pansensitive UA with only trace leukocytes, rare bacteria, 0-5 WBC's -- not consistent with UTI. Prior to admission patient was started on Keflex  so completed course for total 5 days. Recheck as needed  Hyperkalemia Lokelma  given on 8/14.  Resolved Monitor BMP    Hypothyroid TSH 14.9 elevated, increased dose of Synthroid  from 75 to 100 mcg p.o. daily 8/15 Follow-up outpatient to repeat TSH level after 6 weeks.   Drug overdose, intentional, initial encounter  Major depressive disorder, anxiety Continue Celexa  and Abilify  Pt was initially admitted to medical floor from geropsych unit. One-on-one suicide precautions were lifted per psychiatry on 11/12/23. Per psychiatry, pt can be discharged when medically stable, no longer requires psych admission.   OHS/OSA Pulmonary HTN type III Doesn't tolerate CPAP Supplement O2 as needed Weight loss encouraged     History of DVT and PE s/p IVC filter  Eliquis    Hypertension Low diastolic BP Continue amlodipine  Daily vital signs   Anemia of chronic disease Iron  profile, B12 within normal range Monitor H&H/CBC Of note, iron  supplement was causing N/V, pt has done fine w/ switch to Niferex so will continue this    Intertrigo, abdominal folds Lotrisone  cream twice daily followed by nystatin  powder Applied Lotrisone  cream on bilateral lower extremities well for possible dermatitis associated with chronic venous stasis/lymphedema Continue Lotrisone  for at least 2-4 weeks    Super Morbid Obesity based on BMI: Body mass index is 58.73 kg/m.SABRA Significantly low or high BMI is associated with higher medical risk.  Underweight - under 18  overweight - 25 to 29 obese - 30 or more Class 1 obesity: BMI of 30.0 to 34 Class 2 obesity: BMI of 35.0 to 39 Class 3 obesity: BMI of 40.0 to 49 Super Morbid Obesity: BMI  50-59 Super-super Morbid Obesity: BMI 60+ Healthy nutrition and physical activity advised as adjunct to other disease management and risk reduction treatments    DVT prophylaxis: eliquis  IV fluids: no continuous IV fluids  Nutrition: carb modified diet Central lines / other devices: none  Code Status: DNR ACP documentation reviewed: DNR order, MOST and advanced directive are on file in [facility]  [facility] needs: placement Medical barriers to dispo: none.              Subjective / Brief ROS:  Patient reports arm is a bit worse today, no fever/chills, otherwise no complaints today    Family Communication: none at this time     Objective Findings:  Vitals:   11/30/23 0403 11/30/23 0443 11/30/23 0853 11/30/23 1513  BP: (!) 132/57  (!) 139/55 (!) 137/45  Pulse: (!) 50  (!) 49 (!) 52  Resp:   20 20  Temp: 98.4 F (36.9 C)  98.7 F (37.1 C) 99.1 F (37.3 C)  TempSrc: Oral  Oral   SpO2: 98%  99% 100%  Weight:  (!) 142.3 kg    Height:        Intake/Output Summary (Last 24 hours) at 11/30/2023 1720 Last data filed at 11/30/2023 1300 Gross per 24 hour  Intake 600 ml  Output 775 ml  Net -175 ml   Filed Weights   11/26/23 0500 11/29/23 0500 11/30/23 0443  Weight: (!) 140.3 kg (!) 141.9  kg (!) 142.3 kg    Examination:  Physical Exam Constitutional:      General: She is not in acute distress.    Appearance: She is obese.  Cardiovascular:     Rate and Rhythm: Normal rate.  Pulmonary:     Effort: Pulmonary effort is normal.  Abdominal:     Palpations: Abdomen is soft.  Skin:    Comments: Tender area deep to I&D incision in R axilla, however nontender mass also, uncertain if cyst/abscess vs lymph node, it feels firm   Neurological:     Mental Status: She is alert. Mental status is at baseline.          Scheduled Medications:   allopurinol   50 mg Oral QODAY   amLODipine   10 mg Oral Daily   ARIPiprazole   2 mg Oral QHS   Chlorhexidine  Gluconate Cloth  6  each Topical Daily   citalopram   40 mg Oral QHS   clotrimazole -betamethasone    Topical BID   feeding supplement  237 mL Oral BID BM   insulin  aspart  0-15 Units Subcutaneous TID WC   insulin  aspart  0-5 Units Subcutaneous QHS   insulin  glargine-yfgn  20 Units Subcutaneous Daily   iron  polysaccharides  150 mg Oral Daily   levothyroxine   100 mcg Oral QAC breakfast   melatonin  10 mg Oral QHS   multivitamin with minerals  1 tablet Oral Daily   nystatin    Topical BID   pantoprazole   40 mg Oral Daily   polyethylene glycol  17 g Oral Daily   rosuvastatin   10 mg Oral Daily   senna-docusate  1 tablet Oral BID   sulfamethoxazole -trimethoprim   1 tablet Oral Q12H   torsemide   40 mg Oral BID    Continuous Infusions:  promethazine  (PHENERGAN ) injection (IM or IVPB)      PRN Medications:  alum & mag hydroxide-simeth, diazepam , guaiFENesin -dextromethorphan , loratadine , neomycin -bacitracin -polymyxin, ondansetron  (ZOFRAN ) IV, mouth rinse, oxyCODONE , promethazine  (PHENERGAN ) injection (IM or IVPB)  Antimicrobials from admission:  Anti-infectives (From admission, onward)    Start     Dose/Rate Route Frequency Ordered Stop   11/28/23 2000  sulfamethoxazole -trimethoprim  (BACTRIM  DS) 800-160 MG per tablet 1 tablet        1 tablet Oral Every 12 hours 11/28/23 1448 12/03/23 2159           Data Reviewed:  I have personally reviewed the following...  CBC: Recent Labs  Lab 11/24/23 0353 11/25/23 0630 11/26/23 0355 11/29/23 0857  WBC 6.6 6.4 6.6 7.6  HGB 7.9* 7.9* 8.1* 8.0*  HCT 26.2* 26.9* 26.9* 26.8*  MCV 104.0* 105.1* 105.1* 103.1*  PLT 135* 131* 136* 127*   Basic Metabolic Panel: Recent Labs  Lab 11/24/23 0353 11/25/23 0630 11/26/23 0355 11/29/23 0857  NA 138 142 141 138  K 4.0 3.7 3.6 3.9  CL 97* 100 99 96*  CO2 32 32 32 32  GLUCOSE 169* 130* 140* 122*  BUN 65* 64* 64* 86*  CREATININE 2.31* 2.34* 2.33* 2.64*  CALCIUM  9.0 9.0 8.8* 8.7*   GFR: Estimated Creatinine  Clearance: 27.9 mL/min (A) (by C-G formula based on SCr of 2.64 mg/dL (H)). Liver Function Tests: No results for input(s): AST, ALT, ALKPHOS, BILITOT, PROT, ALBUMIN  in the last 168 hours. No results for input(s): LIPASE, AMYLASE in the last 168 hours. No results for input(s): AMMONIA in the last 168 hours. Coagulation Profile: No results for input(s): INR, PROTIME in the last 168 hours. Cardiac Enzymes: No results for input(s): CKTOTAL, CKMB,  CKMBINDEX, TROPONINI in the last 168 hours. BNP (last 3 results) No results for input(s): PROBNP in the last 8760 hours. HbA1C: No results for input(s): HGBA1C in the last 72 hours. CBG: Recent Labs  Lab 11/29/23 1713 11/29/23 2124 11/30/23 0849 11/30/23 1133 11/30/23 1619  GLUCAP 191* 183* 144* 214* 167*   Lipid Profile: No results for input(s): CHOL, HDL, LDLCALC, TRIG, CHOLHDL, LDLDIRECT in the last 72 hours. Thyroid  Function Tests: No results for input(s): TSH, T4TOTAL, FREET4, T3FREE, THYROIDAB in the last 72 hours. Anemia Panel: No results for input(s): VITAMINB12, FOLATE, FERRITIN, TIBC, IRON , RETICCTPCT in the last 72 hours. Most Recent Urinalysis On File:     Component Value Date/Time   COLORURINE YELLOW (A) 11/12/2023 0244   APPEARANCEUR CLEAR (A) 11/12/2023 0244   LABSPEC 1.010 11/12/2023 0244   PHURINE 5.0 11/12/2023 0244   GLUCOSEU NEGATIVE 11/12/2023 0244   HGBUR NEGATIVE 11/12/2023 0244   BILIRUBINUR NEGATIVE 11/12/2023 0244   KETONESUR NEGATIVE 11/12/2023 0244   PROTEINUR >=300 (A) 11/12/2023 0244   UROBILINOGEN 0.2 08/31/2014 1158   NITRITE NEGATIVE 11/12/2023 0244   LEUKOCYTESUR TRACE (A) 11/12/2023 0244   Sepsis Labs: @LABRCNTIP (procalcitonin:4,lacticidven:4) Microbiology: No results found for this or any previous visit (from the past 240 hours).    Radiology Studies last 3 days: US  RT UPPER EXTREM LTD SOFT TISSUE NON VASCULAR Result Date:  11/30/2023 CLINICAL DATA:  Abscess. Axillary abscess and status post bedside I and D. Evaluate for a persistent abscess. EXAM: ULTRASOUND RIGHT UPPER EXTREMITY LIMITED TECHNIQUE: Ultrasound examination of the upper extremity soft tissues was performed in the area of clinical concern. COMPARISON:  None Available. FINDINGS: Poorly defined heterogeneous hypoechoic area at the area of concern in the right axilla. This area roughly measures 1.9 x 1.0 x 1.6 cm. IMPRESSION: Poorly defined superficial heterogeneous area at the area of concern in the right axilla. This area measures up to 1.9 cm. This could represent a small abscess or phlegmon. Electronically Signed   By: Juliene Balder M.D.   On: 11/30/2023 16:29       Procedure note I&D done 11/30/23  PRE-OP  DIAGNOSIS: Abscess of R axilla  POST-OP DIAGNOSIS: Same  PROCEDURE: incision and drainage of abscess Performing Physician: Marsa PROCEDURE:  Patient informed consent obtained verbally after discussion of risks (including but not limited to pain, infection, bleeding, damage to surrounding tissues, incomplete evacuation/treatment of infection, recurrence)  and benefits (adequate treatment and diagnosis, relief of pain). All questions answered prior to procedure.  A timeout protocol was performed prior to initiating the procedure.  The area was cleans and site was anesthetized with 1 cc 1% lidocaine . A linear incision along the local skin lines was made and the purulent material expressed. The abcess was explored thoroughly and sequestered pockets were not found. Bleeding was minimal.  Packing: none Followup: The patient tolerated the procedure well, continue warm compresses and po abx       Laneta Marsa, DO [facility] 11/30/2023, 5:20 PM    Dictation software may have been used to generate the above note. Typos may occur and escape review in typed/dictated notes. Please contact Dr Marsa directly for clarity if needed.  Staff  may message me via secure chat in Epic  but this may not receive an immediate response,  please page me for urgent matters!  If 7PM-7AM, please contact night coverage [email or web address]

## 2023-11-30 NOTE — H&P (View-Only) (Signed)
 Date of Consultation:  11/30/2023  Requesting Physician:  Laneta Blunt, DO  Reason for Consultation:  Right axillary abscess  History of Present Illness: [name] is a 68 y.o. female with multiple medical comorbidities, admitted on 11/11/23 for narcotic overdose.  Most recently she had an I&D of the right axilla on 11/28/23 for an abscess.  Patient tells me today that she still feels soreness in the right axilla, but reports that this is better than before the I&D was done.  Due to persistent firmness and soreness, she had an ultrasound of the axilla which showed a small abscess measuring 1.9 x 1 x 1.6 cm.  WBC has been normal.  She's currently on Bactrim  DS.  Past Medical History: Past Medical History:  Diagnosis Date   A-fib (HCC)    Anemia    Anticoagulated on Coumadin , chronically 09/03/2011   Anxiety    Arthritis    right hip; both knees; left wrist/shoulder; back (01/19/2013   Bleeding on Coumadin  08/2012; 01/18/2013   [facility] admissions (01/19/2013)   CHF (congestive heart failure) (HCC)    2-3 times (01/19/2013)   Chronic lower back pain    Depression    DVT (deep venous thrombosis) (HCC) 10 years ago   numerous/notes 01/18/2013   GERD (gastroesophageal reflux disease)    Gout    Headache(784.0)    maybe weekly (01/19/2013)   Heart murmur    High cholesterol    been off RX for this at one time (01/18/2013)   History of blood transfusion 1983; 04/2012   3 w/ childbirth; hospitalized for pain (01/19/2013)   Hypertension    Hypothyroidism    Migraines    twice/yr maybe (01/19/2013)   Obstructive sleep apnea 05/03/2012   OSA (obstructive sleep apnea)    sent me for test in 04/2012; never ordered mask, etc (01/19/2013)   PE (pulmonary thromboembolism) (HCC) 3 years ag0   3/notes 01/18/2013   Pneumonia before 2011   once' (01/18/2013)   Renal disorder    kindey function low; Metformin was destroying my kidneys (01/19/2013)   Shortness of breath     only related to my CHF (01/18/2013)   Swelling of hand 08/31/2014   RT HAND   Type II diabetes mellitus (HCC)    UTI (urinary tract infection) 08/31/2014     Past Surgical History: Past Surgical History:  Procedure Laterality Date   CARDIAC CATHETERIZATION N/A 03/13/2015   Procedure: Right/Left Heart Cath and Coronary Angiography;  Surgeon: Gordy Bergamo, MD;  Location: [facility] INVASIVE CV LAB;  Service: Cardiovascular;  Laterality: N/A;   CATARACT EXTRACTION W/ INTRAOCULAR LENS  IMPLANT, BILATERAL Bilateral 2006-2011   CESAREAN SECTION  1983   CHOLECYSTECTOMY  ~ 2002   COLONOSCOPY N/A 01/21/2013   Procedure: COLONOSCOPY;  Surgeon: Belvie JONETTA Just, MD;  Location: [facility] ENDOSCOPY;  Service: Endoscopy;  Laterality: N/A;   EYE SURGERY Bilateral    multiple (01/18/2013)   PACEMAKER IMPLANT N/A 08/31/2018   Symptomatic bradycardia due to mobitz II second degree AV block, permanent afib/ atypical atrial flutter implanted by Dr Kelsie PRESCOTT PLANA REPAIR OF RETINAL DEATACHMENT Right    PARS PLANA VITRECTOMY Bilateral 2004-2006   several (01/18/2013)   REFRACTIVE SURGERY Bilateral    for stigmatism (01/18/2013)   REFRACTIVE SURGERY Left ~ 11/2012   to puff it up cause vision got hazy (01/18/2013)   RIGHT HEART CATH N/A 12/30/2018   Procedure: RIGHT HEART CATH;  Surgeon: Cherrie Toribio SAUNDERS, MD;  Location: [facility] INVASIVE  CV LAB;  Service: Cardiovascular;  Laterality: N/A;   RIGHT/LEFT HEART CATH AND CORONARY ANGIOGRAPHY N/A 09/07/2023   Procedure: RIGHT/LEFT HEART CATH AND CORONARY ANGIOGRAPHY;  Surgeon: Wendel Lurena POUR, MD;  Location: [facility] INVASIVE CV LAB;  Service: Cardiovascular;  Laterality: N/A;   VENA CAVA FILTER PLACEMENT  2011?    Home Medications: Prior to Admission medications   Medication Sig Start Date End Date Taking? Authorizing Provider  allopurinol  (ZYLOPRIM ) 100 MG tablet Take 50 mg by mouth every other day. 10/28/23  Yes [provider]  amLODipine  (NORVASC ) 5 MG tablet  Take 2 tablets (10 mg total) by mouth daily. 10/26/23  Yes Milford, Harlene HERO, FNP  apixaban  (ELIQUIS ) 5 MG TABS tablet Take 5 mg by mouth 2 (two) times daily.   Yes [provider]  Biotin  w/ Vitamins C & E (HAIR SKIN & NAILS GUMMIES PO) Take 1 each by mouth daily.   Yes [provider]  citalopram  (CELEXA ) 40 MG tablet Take 40 mg by mouth at bedtime. 10/28/23  Yes [provider]  diazepam  (VALIUM ) 5 MG tablet Take 1 tablet (5 mg total) by mouth 2 (two) times daily as needed for anxiety or muscle spasms. 09/16/23  Yes Arrien, Mauricio Daniel, MD  diphenhydramine -acetaminophen  (TYLENOL  PM) 25-500 MG TABS tablet Take 1 tablet by mouth at bedtime.   Yes [provider]  EMBECTA PEN NEEDLE NANO 2 GEN 32G X 4 MM MISC  11/05/23  Yes [provider]  ferrous sulfate  325 (65 FE) MG EC tablet Take 325 mg by mouth daily.   Yes [provider]  guaiFENesin -dextromethorphan  (ROBITUSSIN DM) 100-10 MG/5ML syrup Take by mouth in the morning, at noon, and at bedtime. 10/28/23  Yes [provider]  insulin  aspart (NOVOLOG ) 100 UNIT/ML injection Inject 6 Units into the skin 3 (three) times daily with meals. 09/15/23  Yes Fairy Frames, MD  insulin  glargine (LANTUS  SOLOSTAR) 100 UNIT/ML Solostar Pen Inject 20 Units into the skin daily.   Yes [provider]  levothyroxine  (SYNTHROID ) 75 MCG tablet Take 75 mcg by mouth daily before breakfast. PATIENT ONLY TAKES NAME BRAND   Yes [provider]  Melatonin 10 MG TABS Take 10 mg by mouth.   Yes [provider]  Multiple Vitamins-Minerals (CENTRUM SILVER) CHEW Chew 2 each by mouth daily.   Yes [provider]  oxyCODONE  (OXY IR/ROXICODONE ) 5 MG immediate release tablet Take 5 mg by mouth every 6 (six) hours as needed for severe pain (pain score 7-10). 10/28/23  Yes [provider]  rosuvastatin  (CRESTOR ) 10 MG tablet Take 1 tablet (10 mg total) by mouth daily. 05/09/16  Yes  Epifanio Rolland Robin, MD  torsemide  (DEMADEX ) 20 MG tablet Take 2 tablets (40mg ) by mouth twice daily, in the morning and at 4:00pm 09/08/23  Yes Danford, Lonni SQUIBB, MD    Allergies: Allergies  Allergen Reactions   Ms Contin [Morphine] Hives, Rash and Other (See Comments)    Broke out in brown spots all over.     Social History:  reports that she quit smoking about 35 years ago. Her smoking use included cigarettes. She started smoking about 65 years ago. She has a 1.5 pack-year smoking history. She has never used smokeless tobacco. She reports that she does not currently use alcohol. She reports that she does not use drugs.   Family History: Family History  Problem Relation Age of Onset   Cerebral aneurysm Mother    Hypertension Father    Cerebral  aneurysm Maternal Grandfather    Cerebral aneurysm Maternal Aunt    Cancer Maternal Uncle     Review of Systems: Review of Systems  Constitutional:  Negative for chills and fever.  Respiratory:  Negative for shortness of breath.   Cardiovascular:  Negative for chest pain.  Gastrointestinal:  Negative for nausea and vomiting.  Skin:        Right axillary abscess, s/p I&D    Physical Exam BP (!) 137/45 (BP Location: Left Arm)   Pulse (!) 52   Temp 99.1 F (37.3 C)   Resp 20   Ht 5' 1 (1.549 m)   Wt (!) 142.3 kg   SpO2 100%   BMI 59.28 kg/m  CONSTITUTIONAL: No acute distress HEENT:  Normocephalic, atraumatic, extraocular motion intact. RESPIRATORY:  Normal respiratory effort without pathologic use of accessory muscles. CARDIOVASCULAR:  bradycardia, paced  no hepatosplenomegaly. SKIN:  Right axilla has a 1.5 cm area of skin ulceration from the abscess with central opening from I&D.  There is some necrotic tissue at the edges.  Probing with qtip, there is still some purulent material and she's also very tender.  Surrounding this is an area of erythema and induration. NEUROLOGIC:  Motor and sensation is grossly normal.   Cranial nerves are grossly intact. PSYCH:  Alert and oriented to person, place and time. Affect is normal.  Laboratory Analysis: Results for orders placed or performed during the hospital encounter of 11/11/23 (from the past 24 hours)  Glucose, capillary     Status: Abnormal   Collection Time: 11/29/23  9:24 PM  Result Value Ref Range   Glucose-Capillary 183 (H) 70 - 99 mg/dL  Glucose, capillary     Status: Abnormal   Collection Time: 11/30/23  8:49 AM  Result Value Ref Range   Glucose-Capillary 144 (H) 70 - 99 mg/dL  Glucose, capillary     Status: Abnormal   Collection Time: 11/30/23 11:33 AM  Result Value Ref Range   Glucose-Capillary 214 (H) 70 - 99 mg/dL  Glucose, capillary     Status: Abnormal   Collection Time: 11/30/23  4:19 PM  Result Value Ref Range   Glucose-Capillary 167 (H) 70 - 99 mg/dL    Imaging: US  RT UPPER EXTREM LTD SOFT TISSUE NON VASCULAR Result Date: 11/30/2023 CLINICAL DATA:  Abscess. Axillary abscess and status post bedside I and D. Evaluate for a persistent abscess. EXAM: ULTRASOUND RIGHT UPPER EXTREMITY LIMITED TECHNIQUE: Ultrasound examination of the upper extremity soft tissues was performed in the area of clinical concern. COMPARISON:  None Available. FINDINGS: Poorly defined heterogeneous hypoechoic area at the area of concern in the right axilla. This area roughly measures 1.9 x 1.0 x 1.6 cm. IMPRESSION: Poorly defined superficial heterogeneous area at the area of concern in the right axilla. This area measures up to 1.9 cm. This could represent a small abscess or phlegmon. Electronically Signed   By: Juliene Balder M.D.   On: 11/30/2023 16:29    Assessment and Plan: This is a 68 y.o. female with a right axillary abscess.  --Discussed with the patient that after probing the wound, there may be a residual abscess and the wound edges have some necrosis and the skin has ulceration.  I think it would be best to do a debridement of the area to excise the ulcerated  skin and clean the necrotic edges to allow the wound to drain better and heal/clear the infection.  She was tender when probing with qtip, and although we would  possibly do this under local anesthetic at bedside, I also think it would be better to do in the OR under monitored sedation so that I can be more thorough in debridement and clearing the infection.  She is in agreement. --Discussed with her then the plan for debridement and drainage of her right axillary abscess and reviewed the surgery at length including the planned incision, risks, pain control, and she's willing to proceed. --She'll be NPO after midnight for OR tomorrow.  Discussed with Dr. Marsa about holding Eliquis  tonight.    I spent 45 minutes dedicated to the care of this patient on the date of this encounter to include pre-visit review of records, face-to-face time with the patient discussing diagnosis and management, and any post-visit coordination of care.   Aloysius Sheree Plant, MD [facility] Pg:  [phone]

## 2023-11-30 NOTE — Plan of Care (Signed)
  Problem: Clinical Measurements: Goal: Ability to maintain clinical measurements within normal limits will improve Outcome: Progressing Goal: Cardiovascular complication will be avoided Outcome: Progressing   Problem: Coping: Goal: Level of anxiety will decrease Outcome: Progressing   Problem: Safety: Goal: Ability to remain free from injury will improve Outcome: Progressing

## 2023-11-30 NOTE — Consult Note (Signed)
 Date of Consultation:  11/30/2023  Requesting Physician:  Laneta Blunt, DO  Reason for Consultation:  Right axillary abscess  History of Present Illness: [name] is a 68 y.o. female with multiple medical comorbidities, admitted on 11/11/23 for narcotic overdose.  Most recently she had an I&D of the right axilla on 11/28/23 for an abscess.  Patient tells me today that she still feels soreness in the right axilla, but reports that this is better than before the I&D was done.  Due to persistent firmness and soreness, she had an ultrasound of the axilla which showed a small abscess measuring 1.9 x 1 x 1.6 cm.  WBC has been normal.  She's currently on Bactrim  DS.  Past Medical History: Past Medical History:  Diagnosis Date   A-fib (HCC)    Anemia    Anticoagulated on Coumadin , chronically 09/03/2011   Anxiety    Arthritis    right hip; both knees; left wrist/shoulder; back (01/19/2013   Bleeding on Coumadin  08/2012; 01/18/2013   [facility] admissions (01/19/2013)   CHF (congestive heart failure) (HCC)    2-3 times (01/19/2013)   Chronic lower back pain    Depression    DVT (deep venous thrombosis) (HCC) 10 years ago   numerous/notes 01/18/2013   GERD (gastroesophageal reflux disease)    Gout    Headache(784.0)    maybe weekly (01/19/2013)   Heart murmur    High cholesterol    been off RX for this at one time (01/18/2013)   History of blood transfusion 1983; 04/2012   3 w/ childbirth; hospitalized for pain (01/19/2013)   Hypertension    Hypothyroidism    Migraines    twice/yr maybe (01/19/2013)   Obstructive sleep apnea 05/03/2012   OSA (obstructive sleep apnea)    sent me for test in 04/2012; never ordered mask, etc (01/19/2013)   PE (pulmonary thromboembolism) (HCC) 3 years ag0   3/notes 01/18/2013   Pneumonia before 2011   once' (01/18/2013)   Renal disorder    kindey function low; Metformin was destroying my kidneys (01/19/2013)   Shortness of breath     only related to my CHF (01/18/2013)   Swelling of hand 08/31/2014   RT HAND   Type II diabetes mellitus (HCC)    UTI (urinary tract infection) 08/31/2014     Past Surgical History: Past Surgical History:  Procedure Laterality Date   CARDIAC CATHETERIZATION N/A 03/13/2015   Procedure: Right/Left Heart Cath and Coronary Angiography;  Surgeon: Gordy Bergamo, MD;  Location: [facility] INVASIVE CV LAB;  Service: Cardiovascular;  Laterality: N/A;   CATARACT EXTRACTION W/ INTRAOCULAR LENS  IMPLANT, BILATERAL Bilateral 2006-2011   CESAREAN SECTION  1983   CHOLECYSTECTOMY  ~ 2002   COLONOSCOPY N/A 01/21/2013   Procedure: COLONOSCOPY;  Surgeon: Belvie JONETTA Just, MD;  Location: [facility] ENDOSCOPY;  Service: Endoscopy;  Laterality: N/A;   EYE SURGERY Bilateral    multiple (01/18/2013)   PACEMAKER IMPLANT N/A 08/31/2018   Symptomatic bradycardia due to mobitz II second degree AV block, permanent afib/ atypical atrial flutter implanted by Dr Kelsie PRESCOTT PLANA REPAIR OF RETINAL DEATACHMENT Right    PARS PLANA VITRECTOMY Bilateral 2004-2006   several (01/18/2013)   REFRACTIVE SURGERY Bilateral    for stigmatism (01/18/2013)   REFRACTIVE SURGERY Left ~ 11/2012   to puff it up cause vision got hazy (01/18/2013)   RIGHT HEART CATH N/A 12/30/2018   Procedure: RIGHT HEART CATH;  Surgeon: Cherrie Toribio SAUNDERS, MD;  Location: [facility] INVASIVE  CV LAB;  Service: Cardiovascular;  Laterality: N/A;   RIGHT/LEFT HEART CATH AND CORONARY ANGIOGRAPHY N/A 09/07/2023   Procedure: RIGHT/LEFT HEART CATH AND CORONARY ANGIOGRAPHY;  Surgeon: Wendel Lurena POUR, MD;  Location: [facility] INVASIVE CV LAB;  Service: Cardiovascular;  Laterality: N/A;   VENA CAVA FILTER PLACEMENT  2011?    Home Medications: Prior to Admission medications   Medication Sig Start Date End Date Taking? Authorizing Provider  allopurinol  (ZYLOPRIM ) 100 MG tablet Take 50 mg by mouth every other day. 10/28/23  Yes [provider]  amLODipine  (NORVASC ) 5 MG tablet  Take 2 tablets (10 mg total) by mouth daily. 10/26/23  Yes Milford, Harlene HERO, FNP  apixaban  (ELIQUIS ) 5 MG TABS tablet Take 5 mg by mouth 2 (two) times daily.   Yes [provider]  Biotin  w/ Vitamins C & E (HAIR SKIN & NAILS GUMMIES PO) Take 1 each by mouth daily.   Yes [provider]  citalopram  (CELEXA ) 40 MG tablet Take 40 mg by mouth at bedtime. 10/28/23  Yes [provider]  diazepam  (VALIUM ) 5 MG tablet Take 1 tablet (5 mg total) by mouth 2 (two) times daily as needed for anxiety or muscle spasms. 09/16/23  Yes Arrien, Mauricio Daniel, MD  diphenhydramine -acetaminophen  (TYLENOL  PM) 25-500 MG TABS tablet Take 1 tablet by mouth at bedtime.   Yes [provider]  EMBECTA PEN NEEDLE NANO 2 GEN 32G X 4 MM MISC  11/05/23  Yes [provider]  ferrous sulfate  325 (65 FE) MG EC tablet Take 325 mg by mouth daily.   Yes [provider]  guaiFENesin -dextromethorphan  (ROBITUSSIN DM) 100-10 MG/5ML syrup Take by mouth in the morning, at noon, and at bedtime. 10/28/23  Yes [provider]  insulin  aspart (NOVOLOG ) 100 UNIT/ML injection Inject 6 Units into the skin 3 (three) times daily with meals. 09/15/23  Yes Fairy Frames, MD  insulin  glargine (LANTUS  SOLOSTAR) 100 UNIT/ML Solostar Pen Inject 20 Units into the skin daily.   Yes [provider]  levothyroxine  (SYNTHROID ) 75 MCG tablet Take 75 mcg by mouth daily before breakfast. PATIENT ONLY TAKES NAME BRAND   Yes [provider]  Melatonin 10 MG TABS Take 10 mg by mouth.   Yes [provider]  Multiple Vitamins-Minerals (CENTRUM SILVER) CHEW Chew 2 each by mouth daily.   Yes [provider]  oxyCODONE  (OXY IR/ROXICODONE ) 5 MG immediate release tablet Take 5 mg by mouth every 6 (six) hours as needed for severe pain (pain score 7-10). 10/28/23  Yes [provider]  rosuvastatin  (CRESTOR ) 10 MG tablet Take 1 tablet (10 mg total) by mouth daily. 05/09/16  Yes  Epifanio Rolland Robin, MD  torsemide  (DEMADEX ) 20 MG tablet Take 2 tablets (40mg ) by mouth twice daily, in the morning and at 4:00pm 09/08/23  Yes Danford, Lonni SQUIBB, MD    Allergies: Allergies  Allergen Reactions   Ms Contin [Morphine] Hives, Rash and Other (See Comments)    Broke out in brown spots all over.     Social History:  reports that she quit smoking about 35 years ago. Her smoking use included cigarettes. She started smoking about 65 years ago. She has a 1.5 pack-year smoking history. She has never used smokeless tobacco. She reports that she does not currently use alcohol. She reports that she does not use drugs.   Family History: Family History  Problem Relation Age of Onset   Cerebral aneurysm Mother    Hypertension Father    Cerebral  aneurysm Maternal Grandfather    Cerebral aneurysm Maternal Aunt    Cancer Maternal Uncle     Review of Systems: Review of Systems  Constitutional:  Negative for chills and fever.  Respiratory:  Negative for shortness of breath.   Cardiovascular:  Negative for chest pain.  Gastrointestinal:  Negative for nausea and vomiting.  Skin:        Right axillary abscess, s/p I&D    Physical Exam BP (!) 137/45 (BP Location: Left Arm)   Pulse (!) 52   Temp 99.1 F (37.3 C)   Resp 20   Ht 5' 1 (1.549 m)   Wt (!) 142.3 kg   SpO2 100%   BMI 59.28 kg/m  CONSTITUTIONAL: No acute distress HEENT:  Normocephalic, atraumatic, extraocular motion intact. RESPIRATORY:  Normal respiratory effort without pathologic use of accessory muscles. CARDIOVASCULAR:  bradycardia, paced  no hepatosplenomegaly. SKIN:  Right axilla has a 1.5 cm area of skin ulceration from the abscess with central opening from I&D.  There is some necrotic tissue at the edges.  Probing with qtip, there is still some purulent material and she's also very tender.  Surrounding this is an area of erythema and induration. NEUROLOGIC:  Motor and sensation is grossly normal.   Cranial nerves are grossly intact. PSYCH:  Alert and oriented to person, place and time. Affect is normal.  Laboratory Analysis: Results for orders placed or performed during the hospital encounter of 11/11/23 (from the past 24 hours)  Glucose, capillary     Status: Abnormal   Collection Time: 11/29/23  9:24 PM  Result Value Ref Range   Glucose-Capillary 183 (H) 70 - 99 mg/dL  Glucose, capillary     Status: Abnormal   Collection Time: 11/30/23  8:49 AM  Result Value Ref Range   Glucose-Capillary 144 (H) 70 - 99 mg/dL  Glucose, capillary     Status: Abnormal   Collection Time: 11/30/23 11:33 AM  Result Value Ref Range   Glucose-Capillary 214 (H) 70 - 99 mg/dL  Glucose, capillary     Status: Abnormal   Collection Time: 11/30/23  4:19 PM  Result Value Ref Range   Glucose-Capillary 167 (H) 70 - 99 mg/dL    Imaging: US  RT UPPER EXTREM LTD SOFT TISSUE NON VASCULAR Result Date: 11/30/2023 CLINICAL DATA:  Abscess. Axillary abscess and status post bedside I and D. Evaluate for a persistent abscess. EXAM: ULTRASOUND RIGHT UPPER EXTREMITY LIMITED TECHNIQUE: Ultrasound examination of the upper extremity soft tissues was performed in the area of clinical concern. COMPARISON:  None Available. FINDINGS: Poorly defined heterogeneous hypoechoic area at the area of concern in the right axilla. This area roughly measures 1.9 x 1.0 x 1.6 cm. IMPRESSION: Poorly defined superficial heterogeneous area at the area of concern in the right axilla. This area measures up to 1.9 cm. This could represent a small abscess or phlegmon. Electronically Signed   By: Juliene Balder M.D.   On: 11/30/2023 16:29    Assessment and Plan: This is a 68 y.o. female with a right axillary abscess.  --Discussed with the patient that after probing the wound, there may be a residual abscess and the wound edges have some necrosis and the skin has ulceration.  I think it would be best to do a debridement of the area to excise the ulcerated  skin and clean the necrotic edges to allow the wound to drain better and heal/clear the infection.  She was tender when probing with qtip, and although we would  possibly do this under local anesthetic at bedside, I also think it would be better to do in the OR under monitored sedation so that I can be more thorough in debridement and clearing the infection.  She is in agreement. --Discussed with her then the plan for debridement and drainage of her right axillary abscess and reviewed the surgery at length including the planned incision, risks, pain control, and she's willing to proceed. --She'll be NPO after midnight for OR tomorrow.  Discussed with Dr. Marsa about holding Eliquis  tonight.    I spent 45 minutes dedicated to the care of this patient on the date of this encounter to include pre-visit review of records, face-to-face time with the patient discussing diagnosis and management, and any post-visit coordination of care.   Aloysius Sheree Plant, MD [facility]:  [phone]

## 2023-11-30 NOTE — Consult Note (Addendum)
 WOC Nurse Consult Note:    WOC Nurse re-consult Note: Refer to previous WOC notes on 8/21.  Requested to reapply bilat Una boots, since legs are weeping through current Una boots and dressings. Generalized edema and erythremia and weeping mod amt yellow fluid to bilat legs.  Left leg with 3 areas of red moist full thickness wounds; anterior leg 5X4X.2cm, 3X2X.2cm, posterior leg 1X1X.2cm; all with large amt pink drainage. Right leg with full thickness wound, 8X6X.2cm, mod amt pink drainage.  Dressings in place to bilat legs as described below and applied bilat Una boots and coban.      Pt had previous I&D to right underarm, 1X1X.2cm red moist full thickness wound.    Dressing procedure/placement/frequency: Topical treatment orders provided for bedside nurses to perform as follows:  1. Change bilat Una boots to bilat legs Q TUES over dressings as follows: Apply Aquacel Soila # [phone]) to bilat leg wounds Q then cover with foam dressings and abd pads, then apply Una boots and coban. Moisten previous dressings with NS to remove each time. 2. Tuck ABD pad into abd folds and change PRN when wet 3. Apply a piece of Aquacel ETTER Collum # [phone]) to right armpit Q day, and cover with foam dressing.  Change foam dressing Q 3 days or PRN soiling.   Pt plans to discharge to another facility today and will need weekly Una boot and dressing changes to bilat legs at that location.   Thank-you,  Stephane Fought MSN, RN, CWOCN, CWCN-AP, CNS Contact Mon-Fri 0700-1500: [phone]

## 2023-12-01 ENCOUNTER — Inpatient Hospital Stay: Admitting: Anesthesiology

## 2023-12-01 ENCOUNTER — Encounter: Payer: Self-pay | Admitting: Internal Medicine

## 2023-12-01 ENCOUNTER — Other Ambulatory Visit: Payer: Self-pay

## 2023-12-01 ENCOUNTER — Encounter
Admission: RE | Disposition: A | Discharge status: Skilled Nursing Facility | Payer: Self-pay | Source: Home / Self Care | Attending: Student

## 2023-12-01 HISTORY — PX: INCISION AND DRAINAGE OF WOUND: SHX1803

## 2023-12-01 LAB — GLUCOSE, CAPILLARY
Glucose-Capillary: 147 mg/dL — ABNORMAL HIGH (ref 70–99)
Glucose-Capillary: 134 mg/dL — ABNORMAL HIGH (ref 70–99)
Glucose-Capillary: 150 mg/dL — ABNORMAL HIGH (ref 70–99)
Glucose-Capillary: 191 mg/dL — ABNORMAL HIGH (ref 70–99)
Glucose-Capillary: 209 mg/dL — ABNORMAL HIGH (ref 70–99)
Glucose-Capillary: 273 mg/dL — ABNORMAL HIGH (ref 70–99)

## 2023-12-01 SURGERY — IRRIGATION AND DEBRIDEMENT WOUND
Anesthesia: General | Site: Axilla | Laterality: Right

## 2023-12-01 MED ORDER — FENTANYL CITRATE (PF) 100 MCG/2ML IJ SOLN
INTRAMUSCULAR | Status: AC
  Filled 2023-12-01: qty 2

## 2023-12-01 MED ORDER — LIDOCAINE HCL (PF) 2 % IJ SOLN
INTRAMUSCULAR | Status: AC
Start: 2023-12-01 — End: 2023-12-01
  Filled 2023-12-01: qty 5

## 2023-12-01 MED ORDER — ONDANSETRON HCL 4 MG/2ML IJ SOLN
INTRAMUSCULAR | Status: AC
  Filled 2023-12-01: qty 2

## 2023-12-01 MED ORDER — DEXAMETHASONE SODIUM PHOSPHATE 10 MG/ML IJ SOLN
INTRAMUSCULAR | Status: DC | PRN
  Administered 2023-12-01: 10 mg via INTRAVENOUS

## 2023-12-01 MED ORDER — PROPOFOL 1000 MG/100ML IV EMUL
INTRAVENOUS | Status: AC
  Filled 2023-12-01: qty 100

## 2023-12-01 MED ORDER — LIDOCAINE HCL (CARDIAC) PF 100 MG/5ML IV SOSY
PREFILLED_SYRINGE | INTRAVENOUS | Status: DC | PRN
  Administered 2023-12-01: 80 mg via INTRAVENOUS

## 2023-12-01 MED ORDER — CEFAZOLIN SODIUM 1 G IJ SOLR
INTRAMUSCULAR | Status: AC
  Filled 2023-12-01: qty 30

## 2023-12-01 MED ORDER — ACETAMINOPHEN 500 MG PO TABS
1000.0000 mg | ORAL_TABLET | Freq: Four times a day (QID) | ORAL | Status: DC | PRN
  Administered 2023-12-03: 1000 mg via ORAL
  Filled 2023-12-01: qty 2

## 2023-12-01 MED ORDER — FENTANYL CITRATE (PF) 100 MCG/2ML IJ SOLN: 25.0000 ug | INTRAMUSCULAR | Status: DC | PRN

## 2023-12-01 MED ORDER — BUPIVACAINE-EPINEPHRINE (PF) 0.5% -1:200000 IJ SOLN
INTRAMUSCULAR | Status: AC
Start: 2023-12-01 — End: 2023-12-01
  Filled 2023-12-01: qty 30

## 2023-12-01 MED ORDER — OXYCODONE HCL 5 MG PO TABS
5.0000 mg | ORAL_TABLET | Freq: Four times a day (QID) | ORAL | Status: DC | PRN
  Administered 2023-12-02 – 2023-12-05 (×2): 5 mg via ORAL
  Filled 2023-12-01 (×2): qty 1

## 2023-12-01 MED ORDER — CEFAZOLIN SODIUM-DEXTROSE 2-3 GM-%(50ML) IV SOLR
INTRAVENOUS | Status: DC | PRN
  Administered 2023-12-01: 3 g via INTRAVENOUS

## 2023-12-01 MED ORDER — SUCCINYLCHOLINE CHLORIDE 200 MG/10ML IV SOSY
PREFILLED_SYRINGE | INTRAVENOUS | Status: DC | PRN
Start: 2023-12-01 — End: 2023-12-01
  Administered 2023-12-01: 100 mg via INTRAVENOUS

## 2023-12-01 MED ORDER — DEXAMETHASONE SODIUM PHOSPHATE 10 MG/ML IJ SOLN
INTRAMUSCULAR | Status: AC
  Filled 2023-12-01: qty 1

## 2023-12-01 MED ORDER — BUPIVACAINE-EPINEPHRINE 0.5% -1:200000 IJ SOLN
INTRAMUSCULAR | Status: DC | PRN
Start: 2023-12-01 — End: 2023-12-01
  Administered 2023-12-01: 30 mL

## 2023-12-01 MED ORDER — 0.9 % SODIUM CHLORIDE (POUR BTL) OPTIME
TOPICAL | Status: DC | PRN
  Administered 2023-12-01: 500 mL

## 2023-12-01 MED ORDER — LACTATED RINGERS IV SOLN: INTRAVENOUS | Status: DC | PRN

## 2023-12-01 MED ORDER — SUCCINYLCHOLINE CHLORIDE 200 MG/10ML IV SOSY
PREFILLED_SYRINGE | INTRAVENOUS | Status: AC
  Filled 2023-12-01: qty 10

## 2023-12-01 MED ORDER — DAKINS (1/4 STRENGTH) 0.125 % EX SOLN
CUTANEOUS | Status: AC
Start: 2023-12-01 — End: 2023-12-01
  Filled 2023-12-01: qty 473

## 2023-12-01 MED ORDER — PROPOFOL 10 MG/ML IV BOLUS
INTRAVENOUS | Status: AC
  Filled 2023-12-01: qty 20

## 2023-12-01 MED ORDER — PROPOFOL 10 MG/ML IV BOLUS
INTRAVENOUS | Status: DC | PRN
  Administered 2023-12-01: 100 mg via INTRAVENOUS
  Administered 2023-12-01: 125 ug/kg/min via INTRAVENOUS

## 2023-12-01 MED ORDER — FENTANYL CITRATE (PF) 100 MCG/2ML IJ SOLN
INTRAMUSCULAR | Status: DC | PRN
  Administered 2023-12-01: 50 ug via INTRAVENOUS

## 2023-12-01 SURGICAL SUPPLY — 24 items
BNDG GAUZE DERMACEA FLUFF 4 (GAUZE/BANDAGES/DRESSINGS) IMPLANT
CHLORAPREP W/TINT 26 (MISCELLANEOUS) IMPLANT
DRAIN PENROSE 12X.25 LTX STRL (MISCELLANEOUS) IMPLANT
DRAPE LAPAROTOMY 77X122 PED (DRAPES) ×1 IMPLANT
DRSG GAUZE FLUFF 36X18 (GAUZE/BANDAGES/DRESSINGS) IMPLANT
ELECTRODE CAUTERY BLDE TIP 2.5 (TIP) ×1 IMPLANT
ELECTRODE REM PT RTRN 9FT ADLT (ELECTROSURGICAL) ×1 IMPLANT
GAUZE SPONGE 4X4 12PLY STRL (GAUZE/BANDAGES/DRESSINGS) IMPLANT
GLOVE SURG SYN 7.0 PF PI (GLOVE) ×1 IMPLANT
GLOVE SURG SYN 7.5 PF PI (GLOVE) ×1 IMPLANT
GOWN STRL REUS W/ TWL LRG LVL3 (GOWN DISPOSABLE) ×2 IMPLANT
KIT TURNOVER KIT A (KITS) ×1 IMPLANT
LABEL OR SOLS (LABEL) ×1 IMPLANT
MANIFOLD NEPTUNE II (INSTRUMENTS) ×1 IMPLANT
NDL HYPO 22X1.5 SAFETY MO (MISCELLANEOUS) ×1 IMPLANT
NEEDLE HYPO 22X1.5 SAFETY MO (MISCELLANEOUS) ×1 IMPLANT
NS IRRIG 500ML POUR BTL (IV SOLUTION) ×1 IMPLANT
PACK BASIN MINOR ARMC (MISCELLANEOUS) ×1 IMPLANT
PAD ABD DERMACEA PRESS 5X9 (GAUZE/BANDAGES/DRESSINGS) IMPLANT
SOLUTION PREP PVP 2OZ (MISCELLANEOUS) IMPLANT
SPONGE T-LAP 18X18 ~~LOC~~+RFID (SPONGE) ×1 IMPLANT
SWAB CULTURE AMIES ANAERIB BLU (MISCELLANEOUS) IMPLANT
SYR 20ML LL LF (SYRINGE) IMPLANT
TRAP FLUID SMOKE EVACUATOR (MISCELLANEOUS) ×1 IMPLANT

## 2023-12-01 NOTE — Discharge Instructions (Signed)
 Right axillary wound instructions: --Please change dressing twice daily:  use moistened 4x4 gauze to pack the wound, then cover with dry 4x4 gauze and secure with tape. --Starting 12/05/23, can change frequency to once daily dressing change.   --Dressing can also be changed additionally as needed if the dressing is getting too saturated or dirty.

## 2023-12-01 NOTE — TOC Progression Note (Addendum)
 Transition of Care [facility]) - Progression Note    Patient Details  Name: [name] MRN: [id number] Date of Birth: Jan 02, 1956  Transition of Care [facility]) CM/SW Contact  Dalia GORMAN Fuse, RN Phone Number: 12/01/2023, 2:50 PM  Clinical Narrative:     Patient is s/p I&D of R axillary abscess. Plan for [facility] when medically ready for discharge.  Approved Certification# [id number] Dates: 8/25-8/31/2025                    Expected Discharge Plan and Services         Expected Discharge Date: 11/27/23                                     Social Drivers of Health (SDOH) Interventions SDOH Screenings   Food Insecurity: No Food Insecurity (11/11/2023)  Housing: Low Risk  (11/11/2023)  Recent Concern: Housing - High Risk (11/05/2023)  Transportation Needs: No Transportation Needs (11/11/2023)  Utilities: Not At Risk (11/11/2023)  Alcohol Screen: Low Risk  (11/05/2023)  Social Connections: Moderately Isolated (11/11/2023)  Tobacco Use: Medium Risk (12/01/2023)    Readmission Risk Interventions     No data to display

## 2023-12-01 NOTE — Transfer of Care (Signed)
 Immediate Anesthesia Transfer of Care Note  Patient: [name]  Procedure(s) Performed: IRRIGATION AND DEBRIDEMENT WOUND (Right: Axilla)  Patient Location: PACU  Anesthesia Type:General  Level of Consciousness: drowsy and patient cooperative  Airway & Oxygen Therapy: Patient Spontanous Breathing and Patient connected to nasal cannula oxygen  Post-op Assessment: Report given to RN and Post -op Vital signs reviewed and stable  Post vital signs: Reviewed and stable  Last Vitals:  Vitals Value Taken Time  BP 166/60 12/01/23 10:34  Temp    Pulse 50 12/01/23 10:35  Resp 21 12/01/23 10:35  SpO2 92 % 12/01/23 10:35  Vitals shown include unfiled device data.  Last Pain:  Vitals:   12/01/23 1034  TempSrc:   PainSc: 0-No pain      Patients Stated Pain Goal: 0 (11/21/23 2044)  Complications: No notable events documented.

## 2023-12-01 NOTE — Progress Notes (Signed)
 PROGRESS NOTE    [name]   FMW:[id number] DOB: October 13, 1955  DOA: 11/11/2023 Date of Service: 12/01/23 which is hospital day 18  PCP: Valma Carwin, MD    Hospital course / significant events:   HPI: [name] is a 68 year old female with pulmonary hypertension, hypertension, atrial fibrillation on Eliquis , history of DVT/PE with IVC, heart failure preserved ejection fraction, morbid obesity, CKD stage IV, chronic respiratory failure requiring 2 L nasal cannula at baseline, anxiety, insulin -dependent diabetes mellitus, who presents emergency department on 11/02/2023 for chief concerns of intentional overdose of OxyContin  on 11/01/2023.  08/01: admitted to behavioral health service on 11/06/2023 for the overdose attempt / SI. 08/06: hospitalist consulted for intractable N/V. Vitals at the time of hospitalist consultation showed temperature of 98.2, respiration rate 20, heart rate 50, blood pressure 163/60, SpO2 of 97% on 2 L nasal cannula. She also complained of constipation for about 2 weeks. Patient was also found to have weeping edema from the abdomen and weeping edema from left leg with bloody drainage soaking through left Unna boot. Admitted to hospitalist  08/07: palliative care consulted to clarify code status. DNR felt appropriate. Psychiatry also saw patient, no concerns on their end - continue Celexa  and Abilify , no psychiatric contraindications to discharge at this time  08/08: weeping edema from abdomen. Left Unna boot with visible oozing blood soaking through. Started IV Lasix  + albumin .  08/09-08/17: planning DC to SNF, placement pending and has been difficult to secure  08/18: psychiatry reevaluation - pt is cleared from their standpoint and no need for psychiatric admission.  08/19-08/22: placement pending, bed offer was withdrawn from planned facility  08/23: bedisde I&D by hospitalist for abscess R axilla 08/24: ok for placement [facility] pending auth  08/25: pt  reporting a bit more soreness today in R axilla - US  to evaluate in case gen surg needs to do more extensive I&D --> US  concerning for abscess, gen surg notified and Dr Desiderio will evaluate / possible to OR tomorrow if needed  08/26: to OR tomorrow for I&D, surgery to recheck tm. Foley out today, void trial. Anticipate dc tomorrow to facility      Consultants:  Psychiatry Palliative Care Nephrology   Procedures/Surgeries: 08/23: bedside I&D R axillary abscess  08/26: deeper I&D and irrigation in OR R axillary abscess      ASSESSMENT & PLAN:   Acute on Chronic HFpEF - improved  Recent echo 08/2023 noted EF 60-65%, moderate LVH, normal RV. Recent R/LHC noted no significant CAD, PA pressure 62/16, wedge of 11, PVR 4.4, group 3 pulmonary hypertension suspected Assoc w/ anasarca, ascites, peripheral edema Diuresis - caution w/ renal function Monitor BMP Monitor daily weight Torsemide     CKD (chronic kidney disease) stage 4, GFR 15-29 ml/min Baseline creatinine around 1.6-2  sCr 1.73>>2.21>>2.31 Monitor BMP with diuresis Nephrology - follow outpatient    R axillary abscess, mild, not resolving/worsening 08/25 Question abscess extension deeper versus enlarged lymph node I&D in OR 12/01/23  Bactrim    Chronic Lymphedema Venous stasis ulcers of left lower extremity  She had weeping edema (bloody drainage) from left leg. Continue Unna boots on bilateral lower extremities.Pt will most likely need these chronically as edema / skin weeping recurs with each removal   Intractable nausea and vomiting - resolved Suspect due to severe constipation, had not had BM in 2 weeks. Bowel regimen antiemetics as needed Monitor BMP Continue PPI Of note, iron  supplement potentially causing N/V, pt has done fine w/ switch  to Niferex so will continue this    Type 2 diabetes mellitus with long-term current use of insulin  Continue insulin  per orders   Paroxysmal atrial fibrillation,  bradycardia HR in the 50s not on rate control drugs Continue Eliquis   Hx UTI, Urine culture from 05/05/2023: Showed Enterobacter cloacae, pansensitive UA with only trace leukocytes, rare bacteria, 0-5 WBC's -- not consistent with UTI. Prior to admission patient was started on Keflex  so completed course for total 5 days. Recheck as needed  Hyperkalemia Lokelma  given on 8/14.  Resolved Monitor BMP    Hypothyroid TSH 14.9 elevated, increased dose of Synthroid  from 75 to 100 mcg p.o. daily 8/15 Follow-up outpatient to repeat TSH level after 6 weeks.   Drug overdose, intentional, initial encounter  Major depressive disorder, anxiety Continue Celexa  and Abilify  Pt was initially admitted to medical floor from geropsych unit. One-on-one suicide precautions were lifted per psychiatry on 11/12/23. Per psychiatry, pt can be discharged when medically stable, no longer requires psych admission.   OHS/OSA Pulmonary HTN type III Doesn't tolerate CPAP Supplement O2 as needed Weight loss encouraged     History of DVT and PE s/p IVC filter  Eliquis    Hypertension Low diastolic BP Continue amlodipine  Daily vital signs   Anemia of chronic disease Iron  profile, B12 within normal range Monitor H&H/CBC Of note, iron  supplement was causing N/V, pt has done fine w/ switch to Niferex so will continue this    Intertrigo, abdominal folds Lotrisone  cream twice daily followed by nystatin  powder Applied Lotrisone  cream on bilateral lower extremities well for possible dermatitis associated with chronic venous stasis/lymphedema Continue Lotrisone  for at least 2-4 weeks    Super Morbid Obesity based on BMI: Body mass index is 58.73 kg/m.SABRA Significantly low or high BMI is associated with higher medical risk.  Underweight - under 18  overweight - 25 to 29 obese - 30 or more Class 1 obesity: BMI of 30.0 to 34 Class 2 obesity: BMI of 35.0 to 39 Class 3 obesity: BMI of 40.0 to 49 Super Morbid  Obesity: BMI 50-59 Super-super Morbid Obesity: BMI 60+ Healthy nutrition and physical activity advised as adjunct to other disease management and risk reduction treatments    DVT prophylaxis: eliquis  IV fluids: no continuous IV fluids  Nutrition: carb modified diet Central lines / other devices: none  Code Status: DNR ACP documentation reviewed: DNR order, MOST and advanced directive are on file in [facility]  [facility] needs: confirm placement  Medical barriers to dispo: surgery to recheck axillary I&D site tm and hopefully can dc then.              Subjective / Brief ROS:  Patient reports fatigue today following OR I&D, pending voiding trial she has not had UOP yet today following foley removal. Otherwise no complaints    Family Communication: none at this time     Objective Findings:  Vitals:   12/01/23 0805 12/01/23 0844 12/01/23 1034 12/01/23 1046  BP:  (!) 154/52 (!) 166/60   Pulse: (!) 50 (!) 51 (!) 50   Resp: 18 15 (!) 22   Temp:  97.6 F (36.4 C) 97.6 F (36.4 C) 98.8 F (37.1 C)  TempSrc:  Temporal    SpO2: 97% 97% 92%   Weight:  (!) 141.4 kg    Height:  5' 1 (1.549 m)      Intake/Output Summary (Last 24 hours) at 12/01/2023 1620 Last data filed at 12/01/2023 1043 Gross per 24 hour  Intake 520 ml  Output 930 ml  Net -410 ml   Filed Weights   11/30/23 0443 12/01/23 0500 12/01/23 0844  Weight: (!) 142.3 kg (!) 141.4 kg (!) 141.4 kg    Examination:  Physical Exam Constitutional:      General: She is not in acute distress.    Appearance: She is obese.  Cardiovascular:     Rate and Rhythm: Normal rate.  Pulmonary:     Effort: Pulmonary effort is normal.  Abdominal:     Palpations: Abdomen is soft.  Skin:    Comments: Tender area deep to I&D incision in R axilla, however nontender mass also, uncertain if cyst/abscess vs lymph node, it feels firm   Neurological:     Mental Status: She is alert. Mental status is at baseline.           Scheduled Medications:   allopurinol   50 mg Oral QODAY   amLODipine   10 mg Oral Daily   ARIPiprazole   2 mg Oral QHS   Chlorhexidine  Gluconate Cloth  6 each Topical Daily   citalopram   40 mg Oral QHS   clotrimazole -betamethasone    Topical BID   feeding supplement  237 mL Oral BID BM   insulin  aspart  0-15 Units Subcutaneous TID WC   insulin  aspart  0-5 Units Subcutaneous QHS   insulin  glargine-yfgn  20 Units Subcutaneous Daily   iron  polysaccharides  150 mg Oral Daily   levothyroxine   100 mcg Oral QAC breakfast   melatonin  10 mg Oral QHS   multivitamin with minerals  1 tablet Oral Daily   nystatin    Topical BID   pantoprazole   40 mg Oral Daily   polyethylene glycol  17 g Oral Daily   rosuvastatin   10 mg Oral Daily   senna-docusate  1 tablet Oral BID   sulfamethoxazole -trimethoprim   1 tablet Oral Q12H   torsemide   40 mg Oral BID    Continuous Infusions:  promethazine  (PHENERGAN ) injection (IM or IVPB) 12.5 mg (12/01/23 1118)    PRN Medications:  acetaminophen , alum & mag hydroxide-simeth, diazepam , guaiFENesin -dextromethorphan , loratadine , neomycin -bacitracin -polymyxin, ondansetron  (ZOFRAN ) IV, mouth rinse, oxyCODONE , promethazine  (PHENERGAN ) injection (IM or IVPB)  Antimicrobials from admission:  Anti-infectives (From admission, onward)    Start     Dose/Rate Route Frequency Ordered Stop   11/28/23 2000  sulfamethoxazole -trimethoprim  (BACTRIM  DS) 800-160 MG per tablet 1 tablet        1 tablet Oral Every 12 hours 11/28/23 1448 12/03/23 2159           Data Reviewed:  I have personally reviewed the following...  CBC: Recent Labs  Lab 11/25/23 0630 11/26/23 0355 11/29/23 0857  WBC 6.4 6.6 7.6  HGB 7.9* 8.1* 8.0*  HCT 26.9* 26.9* 26.8*  MCV 105.1* 105.1* 103.1*  PLT 131* 136* 127*   Basic Metabolic Panel: Recent Labs  Lab 11/25/23 0630 11/26/23 0355 11/29/23 0857  NA 142 141 138  K 3.7 3.6 3.9  CL 100 99 96*  CO2 32 32 32  GLUCOSE  130* 140* 122*  BUN 64* 64* 86*  CREATININE 2.34* 2.33* 2.64*  CALCIUM  9.0 8.8* 8.7*   GFR: Estimated Creatinine Clearance: 27.8 mL/min (A) (by C-G formula based on SCr of 2.64 mg/dL (H)). Liver Function Tests: No results for input(s): AST, ALT, ALKPHOS, BILITOT, PROT, ALBUMIN  in the last 168 hours. No results for input(s): LIPASE, AMYLASE in the last 168 hours. No results for input(s): AMMONIA in the last 168 hours. Coagulation Profile: No results for input(s): INR, PROTIME in  the last 168 hours. Cardiac Enzymes: No results for input(s): CKTOTAL, CKMB, CKMBINDEX, TROPONINI in the last 168 hours. BNP (last 3 results) No results for input(s): PROBNP in the last 8760 hours. HbA1C: No results for input(s): HGBA1C in the last 72 hours. CBG: Recent Labs  Lab 11/30/23 2047 12/01/23 0748 12/01/23 0853 12/01/23 1038 12/01/23 1205  GLUCAP 185* 147* 134* 150* 191*   Lipid Profile: No results for input(s): CHOL, HDL, LDLCALC, TRIG, CHOLHDL, LDLDIRECT in the last 72 hours. Thyroid  Function Tests: No results for input(s): TSH, T4TOTAL, FREET4, T3FREE, THYROIDAB in the last 72 hours. Anemia Panel: No results for input(s): VITAMINB12, FOLATE, FERRITIN, TIBC, IRON , RETICCTPCT in the last 72 hours. Most Recent Urinalysis On File:     Component Value Date/Time   COLORURINE YELLOW (A) 11/12/2023 0244   APPEARANCEUR CLEAR (A) 11/12/2023 0244   LABSPEC 1.010 11/12/2023 0244   PHURINE 5.0 11/12/2023 0244   GLUCOSEU NEGATIVE 11/12/2023 0244   HGBUR NEGATIVE 11/12/2023 0244   BILIRUBINUR NEGATIVE 11/12/2023 0244   KETONESUR NEGATIVE 11/12/2023 0244   PROTEINUR >=300 (A) 11/12/2023 0244   UROBILINOGEN 0.2 08/31/2014 1158   NITRITE NEGATIVE 11/12/2023 0244   LEUKOCYTESUR TRACE (A) 11/12/2023 0244   Sepsis Labs: @LABRCNTIP (procalcitonin:4,lacticidven:4) Microbiology: No results found for this or any previous visit (from  the past 240 hours).    Radiology Studies last 3 days: US  RT UPPER EXTREM LTD SOFT TISSUE NON VASCULAR Result Date: 11/30/2023 CLINICAL DATA:  Abscess. Axillary abscess and status post bedside I and D. Evaluate for a persistent abscess. EXAM: ULTRASOUND RIGHT UPPER EXTREMITY LIMITED TECHNIQUE: Ultrasound examination of the upper extremity soft tissues was performed in the area of clinical concern. COMPARISON:  None Available. FINDINGS: Poorly defined heterogeneous hypoechoic area at the area of concern in the right axilla. This area roughly measures 1.9 x 1.0 x 1.6 cm. IMPRESSION: Poorly defined superficial heterogeneous area at the area of concern in the right axilla. This area measures up to 1.9 cm. This could represent a small abscess or phlegmon. Electronically Signed   By: Juliene Balder M.D.   On: 11/30/2023 16:29       Procedure note I&D done 12/01/23  PRE-OP  DIAGNOSIS: Abscess of R axilla  POST-OP DIAGNOSIS: Same  PROCEDURE: incision and drainage of abscess Performing Physician: Marsa PROCEDURE:  Patient informed consent obtained verbally after discussion of risks (including but not limited to pain, infection, bleeding, damage to surrounding tissues, incomplete evacuation/treatment of infection, recurrence)  and benefits (adequate treatment and diagnosis, relief of pain). All questions answered prior to procedure.  A timeout protocol was performed prior to initiating the procedure.  The area was cleans and site was anesthetized with 1 cc 1% lidocaine . A linear incision along the local skin lines was made and the purulent material expressed. The abcess was explored thoroughly and sequestered pockets were not found. Bleeding was minimal.  Packing: none Followup: The patient tolerated the procedure well, continue warm compresses and po abx       Laneta Marsa, DO [facility] 12/01/2023, 4:20 PM    Dictation software may have been used to generate the above note. Typos  may occur and escape review in typed/dictated notes. Please contact Dr Marsa directly for clarity if needed.  Staff may message me via secure chat in Epic  but this may not receive an immediate response,  please page me for urgent matters!  If 7PM-7AM, please contact night coverage [email or web address]

## 2023-12-01 NOTE — Plan of Care (Signed)
  Problem: Clinical Measurements: Goal: Ability to maintain clinical measurements within normal limits will improve Outcome: Progressing   Problem: Nutrition: Goal: Adequate nutrition will be maintained Outcome: Progressing   Problem: Pain Managment: Goal: General experience of comfort will improve and/or be controlled Outcome: Progressing   Problem: Coping: Goal: Ability to adjust to condition or change in health will improve Outcome: Progressing

## 2023-12-01 NOTE — Interval H&P Note (Signed)
 History and Physical Interval Note:  12/01/2023 9:02 AM  [name]  has presented today for surgery, with the diagnosis of Right axillary abscess.  The various methods of treatment have been discussed with the patient and family. After consideration of risks, benefits and other options for treatment, the patient has consented to  Procedure(s): IRRIGATION AND DEBRIDEMENT WOUND (Right) as a surgical intervention.  The patient's history has been reviewed, patient examined, no change in status, stable for surgery.  I have reviewed the patient's chart and labs.  Questions were answered to the patient's satisfaction.     [name]

## 2023-12-01 NOTE — Anesthesia Procedure Notes (Signed)
 Procedure Name: Intubation Date/Time: 12/01/2023 9:41 AM  Performed by: Lorrene Camelia LABOR, CRNAPre-anesthesia Checklist: Patient identified, Patient being monitored, Timeout performed, Emergency Drugs available and Suction available Patient Re-evaluated:Patient Re-evaluated prior to induction Oxygen Delivery Method: Circle system utilized Preoxygenation: Pre-oxygenation with 100% oxygen Induction Type: IV induction Ventilation: Mask ventilation without difficulty Laryngoscope Size: 3 and McGrath Grade View: Grade I Tube type: Oral Tube size: 7.0 mm Number of attempts: 1 Airway Equipment and Method: Stylet Placement Confirmation: ETT inserted through vocal cords under direct vision, positive ETCO2 and breath sounds checked- equal and bilateral Secured at: 22 cm Tube secured with: Tape Dental Injury: Teeth and Oropharynx as per pre-operative assessment

## 2023-12-01 NOTE — Anesthesia Preprocedure Evaluation (Addendum)
 Anesthesia Evaluation  Patient identified by MRN, date of birth, ID band Patient awake    Reviewed: Allergy & Precautions, NPO status , Patient's Chart, lab work & pertinent test results  History of Anesthesia Complications (+) PONV and history of anesthetic complications  Airway Mallampati: III  TM Distance: >3 FB Neck ROM: full    Dental no notable dental hx.    Pulmonary shortness of breath, sleep apnea and Continuous Positive Airway Pressure Ventilation , COPD (2L),  oxygen dependent, former smoker   Pulmonary exam normal        Cardiovascular hypertension, On Medications pulmonary hypertension+CHF and + DVT (/PE w/ IVC filter)  Normal cardiovascular exam+ dysrhythmias Atrial Fibrillation + pacemaker   IMPRESSIONS     1. Left ventricular ejection fraction, by estimation, is 60 to 65%. The  left ventricle has normal function. The left ventricle has no regional  wall motion abnormalities. The left ventricular internal cavity size was  mildly dilated. There is moderate  concentric left ventricular hypertrophy. Left ventricular diastolic  function could not be evaluated. Elevated left atrial pressure.   2. Right ventricular systolic function is normal. The right ventricular  size is normal.   3. Left atrial size was moderately dilated.   4. Right atrial size was moderately dilated.   5. The mitral valve is normal in structure. No evidence of mitral valve  regurgitation. No evidence of mitral stenosis. Moderate to severe mitral  annular calcification.   6. The aortic valve is tricuspid. There is mild calcification of the  aortic valve. There is mild thickening of the aortic valve. Aortic valve  regurgitation is not visualized. Aortic valve sclerosis/calcification is  present, without any evidence of  aortic stenosis.   7. The inferior vena cava is normal in size with greater than 50%  respiratory variability, suggesting  right atrial pressure of 3 mmHg.     Neuro/Psych  Headaches PSYCHIATRIC DISORDERS Anxiety Depression       GI/Hepatic Neg liver ROS,GERD  Medicated,,  Endo/Other  diabetesHypothyroidism  Class 4 obesity  Renal/GU CRFRenal disease     Musculoskeletal  (+) Arthritis ,    Abdominal   Peds  Hematology  (+) Blood dyscrasia, anemia   Anesthesia Other Findings Past Medical History: No date: A-fib [facility]) No date: Anemia 09/03/2011: Anticoagulated on Coumadin , chronically No date: Anxiety No date: Arthritis     Comment:  right hip; both knees; left wrist/shoulder; back               (01/19/2013 08/2012; 01/18/2013: Bleeding on Coumadin      Comment:  [facility] admissions (01/19/2013) No date: CHF (congestive heart failure) (HCC)     Comment:  2-3 times (01/19/2013) No date: Chronic lower back pain No date: Depression 10 years ago: DVT (deep venous thrombosis) (HCC)     Comment:  numerous/notes 01/18/2013 No date: GERD (gastroesophageal reflux disease) No date: Gout No date: Headache(784.0)     Comment:  maybe weekly (01/19/2013) No date: Heart murmur No date: High cholesterol     Comment:  been off RX for this at one time (01/18/2013) 1983; 04/2012: History of blood transfusion     Comment:  3 w/ childbirth; hospitalized for pain (01/19/2013) No date: Hypertension No date: Hypothyroidism No date: Migraines     Comment:  twice/yr maybe (01/19/2013) 05/03/2012: Obstructive sleep apnea No date: OSA (obstructive sleep apnea)     Comment:  sent me for test in 04/2012; never ordered mask, etc               (  01/19/2013) 3 years ag0: PE (pulmonary thromboembolism) (HCC)     Comment:  3/notes 01/18/2013 before 2011: Pneumonia     Comment:  once' (01/18/2013) No date: Renal disorder     Comment:  kindey function low; Metformin was destroying my               kidneys (01/19/2013) No date: Shortness of breath     Comment:  only related to my CHF  (01/18/2013) 08/31/2014: Swelling of hand     Comment:  RT HAND No date: Type II diabetes mellitus (HCC) 08/31/2014: UTI (urinary tract infection)  Past Surgical History: 03/13/2015: CARDIAC CATHETERIZATION; N/A     Comment:  Procedure: Right/Left Heart Cath and Coronary               Angiography;  Surgeon: Gordy Bergamo, MD;  Location: [facility]               INVASIVE CV LAB;  Service: Cardiovascular;  Laterality:               N/A; 2006-2011: CATARACT EXTRACTION W/ INTRAOCULAR LENS  IMPLANT,  BILATERAL; Bilateral 1983: CESAREAN SECTION ~ 2002: CHOLECYSTECTOMY 01/21/2013: COLONOSCOPY; N/A     Comment:  Procedure: COLONOSCOPY;  Surgeon: Belvie JONETTA Just, MD;                Location[facility] ENDOSCOPY;  Service: Endoscopy;  Laterality:              N/A; No date: EYE SURGERY; Bilateral     Comment:  multiple (01/18/2013) 08/31/2018: PACEMAKER IMPLANT; N/A     Comment:  Symptomatic bradycardia due to mobitz II second degree               AV block, permanent afib/ atypical atrial flutter               implanted by Dr Kelsie No date: PARS PLANA REPAIR OF RETINAL DEATACHMENT; Right 2004-2006: PARS PLANA VITRECTOMY; Bilateral     Comment:  several (01/18/2013) No date: REFRACTIVE SURGERY; Bilateral     Comment:  for stigmatism (01/18/2013) ~ 11/2012: REFRACTIVE SURGERY; Left     Comment:  to puff it up cause vision got hazy (01/18/2013) 12/30/2018: RIGHT HEART CATH; N/A     Comment:  Procedure: RIGHT HEART CATH;  Surgeon: Cherrie Toribio SAUNDERS, MD;  Location: [facility] INVASIVE CV LAB;  Service:               Cardiovascular;  Laterality: N/A; 09/07/2023: RIGHT/LEFT HEART CATH AND CORONARY ANGIOGRAPHY; N/A     Comment:  Procedure: RIGHT/LEFT HEART CATH AND CORONARY               ANGIOGRAPHY;  Surgeon: Wendel Lurena POUR, MD;  Location:               [facility] INVASIVE CV LAB;  Service: Cardiovascular;                Laterality: N/A; 2011?: VENA CAVA FILTER PLACEMENT  BMI    Body Mass Index: 58.90 kg/m       Reproductive/Obstetrics negative OB ROS                              Anesthesia Physical Anesthesia Plan  ASA: 4  Anesthesia Plan: General LMA   Post-op Pain Management: Ofirmev  IV (intra-op)*   Induction: Intravenous  PONV  Risk Score and Plan: 3 and Dexamethasone , Ondansetron , Midazolam  and Treatment may vary due to age or medical condition  Airway Management Planned: Oral ETT  Additional Equipment:   Intra-op Plan:   Post-operative Plan: Extubation in OR  Informed Consent: I have reviewed the patients History and Physical, chart, labs and discussed the procedure including the risks, benefits and alternatives for the proposed anesthesia with the patient or authorized representative who has indicated his/her understanding and acceptance.     Dental Advisory Given  Plan Discussed with: Anesthesiologist, CRNA and Surgeon  Anesthesia Plan Comments: (Patient consented for risks of anesthesia including but not limited to:  - adverse reactions to medications - damage to eyes, teeth, lips or other oral mucosa - nerve damage due to positioning  - sore throat or hoarseness - Damage to heart, brain, nerves, lungs, other parts of body or loss of life  Patient voiced understanding and assent.)         Anesthesia Quick Evaluation

## 2023-12-01 NOTE — Progress Notes (Signed)
 PT Cancellation Note  Patient Details Name: CHANDI NICKLIN MRN: [id number] DOB: 05-29-1955   Cancelled Treatment:    Reason Eval/Treat Not Completed: Patient at procedure or test/unavailable. Per EMR pt planned for I&D in OR today for axillary abscess. PT to hold until procedure is complete.    Dorina HERO. Fairly IV, PT, DPT Physical Therapist- [place]  [facility] 12/01/2023, 8:11 AM

## 2023-12-01 NOTE — Progress Notes (Signed)
 Attempted bladder scan on this pt but was unable to find bladder after many attempts.  Pt not in distress or discomfort while pressing on abdomen.  MD notified, pt has had this problem in past with bladder scans.  Per MD if pt hasn't voided in 12 hours post foley removal then do in/out cath.

## 2023-12-01 NOTE — Progress Notes (Signed)
 Occupational Therapy Treatment Patient Details Name: [name] MRN: [id number] DOB: [birth year] Today's Date: 12/01/2023   History of present illness [name] is a 67yoF 11/02/2023 after intentional ingestion of oxycodone  c SI. PMH: MDD, GAD,HTN, AF, DM2, DVT/PE status post IVC filter, HFpEF, single-chamber PPM and chronic pain. Pt recently started on home O2.   OT comments  Pt seen for OT treatment on this date. Upon arrival to room pt supine in bed with HOB elevated, agreeable to light tx. Pt refused any OOB or EOB activities on this date stating that she has a procedure this AM and that she does not want to become fatigued even with education on benefits of spending time OOB or at least completing ADLs at EOB to promote building activity tolerance. Pt will to engage in light grooming tasks at bed level with set-up.  Pt discussed wanting to return to [facility] and reports that she plans to purchase a power wheelchair to be able to access all areas of the facility with less fatigue.  Pt making progress toward goals, will continue to follow POC. Discharge recommendation remains appropriate.        If plan is discharge home, recommend the following:  A little help with bathing/dressing/bathroom;Help with stairs or ramp for entrance   Equipment Recommendations       Recommendations for Other Services      Precautions / Restrictions Precautions Precautions: Fall Restrictions Other Position/Activity Restrictions: Strict I/O, SpO2 >/= 92% pt on O2 throughout session with sao2 dropping to 93% whiel ambulating to BR.        Mobility Bed Mobility                    Transfers                         Balance                                           ADL either performed or assessed with clinical judgement   ADL Overall ADL's : Needs assistance/impaired     Grooming: Wash/dry face;Bed level Grooming Details (indicate cue type  and reason): Pt refused any OOB or EOB activities on this date stating that she has a procedure this AM and that she does not want to become fatigued.           Upper Body Dressing Details (indicate cue type and reason): Pt refused to complete UB dressing tasks (changing hospital gown) at EOB, refused any OOB activities today.                   General ADL Comments: Pt refused any OOB or EOB activities on this date stating that she has a procedure this AM and that she does not want to become fatigued.  Would only complete light grooming/hygiene tasks at bed level.    Extremity/Trunk Assessment Upper Extremity Assessment Upper Extremity Assessment: Generalized weakness            Vision Patient Visual Report: No change from baseline     Perception     Praxis     Communication Communication Communication: No apparent difficulties   Cognition Arousal: Alert Behavior During Therapy: WFL for tasks assessed/performed Cognition: No apparent impairments  Following commands: Intact        Cueing   Cueing Techniques: Verbal cues  Exercises Other Exercises Other Exercises: Education on benefits of spending time OOB or at least at EOB to promote building activity tolerance however pt refused and reported that she would only complete tasks at bed level.    Shoulder Instructions       General Comments      Pertinent Vitals/ Pain       Pain Assessment Pain Assessment: 0-10 Pain Score: 0-No pain Faces Pain Scale: No hurt  Home Living                                          Prior Functioning/Environment              Frequency  Min 1X/week        Progress Toward Goals  OT Goals(current goals can now be found in the care plan section)  Progress towards OT goals: Progressing toward goals  Acute Rehab OT Goals Patient Stated Goal: Plan to return to [facility] Potential to Achieve  Goals: Fair  Plan      Co-evaluation                 AM-PAC OT 6 Clicks Daily Activity     Outcome Measure   Help from another person eating meals?: None Help from another person taking care of personal grooming?: None Help from another person toileting, which includes using toliet, bedpan, or urinal?: A Little Help from another person bathing (including washing, rinsing, drying)?: A Lot Help from another person to put on and taking off regular upper body clothing?: A Little Help from another person to put on and taking off regular lower body clothing?: A Lot 6 Click Score: 18    End of Session    OT Visit Diagnosis: Unsteadiness on feet (R26.81);Muscle weakness (generalized) (M62.81)   Activity Tolerance Patient tolerated treatment well   Patient Left in bed;with call bell/phone within reach;with bed alarm set   Nurse Communication Mobility status        Time: 9252-9197 OT Time Calculation (min): 15 min  Charges: OT General Charges $OT Visit: 1 Visit OT Treatments $Self Care/Home Management : 8-22 mins  Harlene Sharps OTR/L   Harlene LITTIE Sharps 12/01/2023, 8:12 AM

## 2023-12-01 NOTE — Op Note (Signed)
  Procedure Date:  12/01/2023  Pre-operative Diagnosis:  Right axillary abscess  Post-operative Diagnosis: Right axillary abscess  Procedure:  Incision and Drainage of right axillary abscess with debridement of necrotic skin and subcutaneous tissue for area 2.5 x 2.5 cm.  Surgeon:  Aloysius Sheree Plant, MD  Anesthesia:  General endotracheal  Estimated Blood Loss:  5 ml  Specimens:   Right axillary abscess fluid culture swab Right axillary abscess tissue culture  Complications:  None  Indications for Procedure:  This is a 68 y.o. female with diagnosis of right axillary abscess s/p bedside I&D procedure, with persistent pain and ultrasound showing possible residual abscess.  The risks of bleeding, abscess or infection, injury to surrounding structures, and need for further procedures were all discussed with the patient and was willing to proceed.  Description of Procedure: The patient was correctly identified in the preoperative area and brought into the operating room.  The patient was placed supine with VTE prophylaxis in place.  Appropriate time-outs were performed.  Anesthesia was induced and the patient was intubated.  Appropriate antibiotics were infused.  The patient's right axilla was prepped and draped in usual sterile fashion.  Kelly forceps were used to probe the abscess cavity using the prior I&D site.  There was some purulent fluid and this was swabbed and sent for culture.  The wound consisted of some necrotic skin and subcutaneous tissue of about 2.5 cm diameter.   Cautery was used to excise and debride the skin and subcutaneous tissue down to healthy tissue edges.  The cavity was about 3 cm deep.  This tissue was sent for culture as well.  Cautery was used for hemostasis.  The cavity was irrigated and local anesthetic was injected.  The wound was packed with saline moistened 4x4 gauze and covered with dry gauze and tape.  The patient was then emerged from anesthesia, extubated,  and brought to the recovery room for further management.  The patient tolerated the procedure well and all counts were correct at the end of the case.   Aloysius Sheree Plant, MD

## 2023-12-02 ENCOUNTER — Ambulatory Visit: Admitting: Vascular Surgery

## 2023-12-02 ENCOUNTER — Encounter: Payer: Self-pay | Admitting: Surgery

## 2023-12-02 ENCOUNTER — Ambulatory Visit (HOSPITAL_COMMUNITY)

## 2023-12-02 LAB — CBC
HCT: 27 % — ABNORMAL LOW (ref 36.0–46.0)
Hemoglobin: 8.2 g/dL — ABNORMAL LOW (ref 12.0–15.0)
MCH: 30.6 pg (ref 26.0–34.0)
MCHC: 30.4 g/dL (ref 30.0–36.0)
MCV: 100.7 fL — ABNORMAL HIGH (ref 80.0–100.0)
Platelets: 157 K/uL (ref 150–400)
RBC: 2.68 MIL/uL — ABNORMAL LOW (ref 3.87–5.11)
RDW: 15.8 % — ABNORMAL HIGH (ref 11.5–15.5)
WBC: 7.3 K/uL (ref 4.0–10.5)
nRBC: 0 % (ref 0.0–0.2)

## 2023-12-02 LAB — GLUCOSE, CAPILLARY
Glucose-Capillary: 177 mg/dL — ABNORMAL HIGH (ref 70–99)
Glucose-Capillary: 209 mg/dL — ABNORMAL HIGH (ref 70–99)
Glucose-Capillary: 182 mg/dL — ABNORMAL HIGH (ref 70–99)
Glucose-Capillary: 184 mg/dL — ABNORMAL HIGH (ref 70–99)
Glucose-Capillary: 167 mg/dL — ABNORMAL HIGH (ref 70–99)

## 2023-12-02 LAB — BASIC METABOLIC PANEL WITH GFR
Anion gap: 9 (ref 5–15)
BUN: 93 mg/dL — ABNORMAL HIGH (ref 8–23)
CO2: 32 mmol/L (ref 22–32)
Calcium: 9.2 mg/dL (ref 8.9–10.3)
Chloride: 97 mmol/L — ABNORMAL LOW (ref 98–111)
Creatinine, Ser: 3.27 mg/dL — ABNORMAL HIGH (ref 0.44–1.00)
GFR, Estimated: 15 mL/min — ABNORMAL LOW (ref >60)
Glucose, Bld: 175 mg/dL — ABNORMAL HIGH (ref 70–99)
Potassium: 4.8 mmol/L (ref 3.5–5.1)
Sodium: 138 mmol/L (ref 135–145)

## 2023-12-02 MED ORDER — DOXYCYCLINE HYCLATE 100 MG PO TABS
100.0000 mg | ORAL_TABLET | Freq: Two times a day (BID) | ORAL | Status: AC
  Administered 2023-12-02 – 2023-12-03 (×3): 100 mg via ORAL
  Filled 2023-12-02 (×3): qty 1

## 2023-12-02 MED ORDER — LACTATED RINGERS IV SOLN: INTRAVENOUS | Status: DC

## 2023-12-02 NOTE — Plan of Care (Signed)

## 2023-12-02 NOTE — Progress Notes (Signed)
 Occupational Therapy Treatment Patient Details Name: [name] MRN: [id number] DOB: Dec 26, 1955 Today's Date: 12/02/2023   History of present illness [name] is a 67yoF 11/02/2023 after intentional ingestion of oxycodone  c SI. PMH: MDD, GAD,HTN, AF, DM2, DVT/PE status post IVC filter, HFpEF, single-chamber PPM and chronic pain. Pt recently started on home O2.   OT comments  Pt seen for OT treatment on this date. Upon arrival to room pt seated at recliner, agreeable to tx. Pt requires verbal cuing for safety with hand placement during ADL transfers to RW. Completed UB dressing task while seated with Min a overall with assistance with setting up hospital gown. Pt required seated recovery periods with cuing for breathing techniques due to level of perceived exertion.  Pt making good progress toward goals, will continue to follow POC. Discharge recommendation remains appropriate.        If plan is discharge home, recommend the following:  A little help with bathing/dressing/bathroom;Help with stairs or ramp for entrance   Equipment Recommendations  Other (comment) (RW, pt also reports she may purchase a motorized wheelchair.)    Recommendations for Other Services      Precautions / Restrictions Precautions Precautions: Fall Recall of Precautions/Restrictions: Intact Restrictions Weight Bearing Restrictions Per Provider Order: No Other Position/Activity Restrictions: Strict I/O, SpO2 >/= 92% pt on O2 throughout session with sao2 dropping to 93% whiel ambulating to BR.        Mobility Bed Mobility               General bed mobility comments: Sitting in recliner with BLEs elevated    Transfers Overall transfer level: Needs assistance Equipment used: Rolling walker (2 wheels) Transfers: Sit to/from Stand Sit to Stand: Contact guard assist                 Balance Overall balance assessment: Needs assistance Sitting-balance support: Feet supported Sitting  balance-[name]: Good     Standing balance support: During functional activity, Single extremity supported, Bilateral upper extremity supported Standing balance-[name]: Fair                             ADL either performed or assessed with clinical judgement   ADL Overall ADL's : Needs assistance/impaired                 Upper Body Dressing : Minimal assistance;Sitting                          Extremity/Trunk Assessment Upper Extremity Assessment Upper Extremity Assessment: Generalized weakness            Vision       Perception     Praxis     Communication Communication Communication: No apparent difficulties   Cognition Arousal: Alert Behavior During Therapy: WFL for tasks assessed/performed Cognition: No apparent impairments                               Following commands: Intact        Cueing   Cueing Techniques: Verbal cues  Exercises Other Exercises Other Exercises: Education on safety with hand placement during sit to stand ADL transfers.    Shoulder Instructions       General Comments      Pertinent Vitals/ Pain       Pain Assessment Pain Score: 0-No  pain  Home Living                                          Prior Functioning/Environment              Frequency  Min 1X/week        Progress Toward Goals  OT Goals(current goals can now be found in the care plan section)  Progress towards OT goals: Progressing toward goals  Acute Rehab OT Goals Potential to Achieve Goals: Good  Plan      Co-evaluation                 AM-PAC OT 6 Clicks Daily Activity     Outcome Measure   Help from another person eating meals?: None Help from another person taking care of personal grooming?: None Help from another person toileting, which includes using toliet, bedpan, or urinal?: A Little Help from another person bathing (including washing, rinsing, drying)?: A  Lot Help from another person to put on and taking off regular upper body clothing?: A Little Help from another person to put on and taking off regular lower body clothing?: A Lot 6 Click Score: 18    End of Session Equipment Utilized During Treatment: Rolling walker (2 wheels);Oxygen  OT Visit Diagnosis: Unsteadiness on feet (R26.81);Muscle weakness (generalized) (M62.81)   Activity Tolerance Patient tolerated treatment well   Patient Left in chair;with call bell/phone within reach   Nurse Communication Mobility status        Time: 8896-8877 OT Time Calculation (min): 19 min  Charges: OT General Charges $OT Visit: 1 Visit OT Treatments $Self Care/Home Management : 8-22 mins  Harlene Sharps OTR/L   Harlene LITTIE Sharps 12/02/2023, 12:49 PM

## 2023-12-02 NOTE — Anesthesia Postprocedure Evaluation (Signed)
 Anesthesia Post Note  Patient: [name]  Procedure(s) Performed: IRRIGATION AND DEBRIDEMENT WOUND (Right: Axilla)  Patient location during evaluation: PACU Anesthesia Type: General Level of consciousness: awake and alert Pain management: pain level controlled Vital Signs Assessment: post-procedure vital signs reviewed and stable Respiratory status: spontaneous breathing, nonlabored ventilation, respiratory function stable and patient connected to nasal cannula oxygen Cardiovascular status: blood pressure returned to baseline and stable Postop Assessment: no apparent nausea or vomiting Anesthetic complications: no   No notable events documented.   Last Vitals:  Vitals:   12/02/23 0412 12/02/23 0759  BP: 133/64 (!) 147/57  Pulse: (!) 50 (!) 50  Resp: 20 16  Temp: 37 C 36.8 C  SpO2: 100% 100%    Last Pain:  Vitals:   12/02/23 0412  TempSrc: Oral  PainSc:                  Lendia LITTIE Mae

## 2023-12-02 NOTE — Progress Notes (Signed)
 [facility] SURGICAL PROGRESS NOTE  Hospital Day(s): 19.   Post op day(s): 1 Day Post-Op.   Interval History:  Patient seen and examined No acute events or new complaints overnight.  She is without leukocytosis; WBC 7.3K Hgb to 8.2; stable  Renal function worse than baseline; sCr - 3.27; UO - 300 ccs + unmeasured x2 Cx from OR with [facility] She is on Bactrim    Vital signs in last 24 hours: [min-max] current  Temp:  [97.6 F (36.4 C)-98.8 F (37.1 C)] 98.6 F (37 C) (08/27 0412) Pulse Rate:  [49-51] 50 (08/27 0412) Resp:  [15-22] 20 (08/27 0412) BP: (133-166)/(47-64) 133/64 (08/27 0412) SpO2:  [92 %-100 %] 100 % (08/27 0412) Weight:  [136.6 kg-141.4 kg] 136.6 kg (08/27 0500)     Height: 5' 1 (154.9 cm) Weight: (!) 136.6 kg BMI (Calculated): 56.93   Intake/Output last 2 shifts:  08/26 0701 - 08/27 0700 In: 520 [P.O.:240; I.V.:250; IV Piggyback:30] Out: 305 [Urine:300; Blood:5]   Physical Exam:  Constitutional: alert, cooperative and no distress  Respiratory: breathing non-labored at rest  Integumentary: About 2 x 3 cm wound to the right axilla, packing removed and replaced, no evidence of residual infection   Labs:     Latest Ref Rng & Units 12/02/2023    5:45 AM 11/29/2023    8:57 AM 11/26/2023    3:55 AM  CBC  WBC 4.0 - 10.5 K/uL 7.3  7.6  6.6   Hemoglobin 12.0 - 15.0 g/dL 8.2  8.0  8.1   Hematocrit 36.0 - 46.0 % 27.0  26.8  26.9   Platelets 150 - 400 K/uL 157  127  136       Latest Ref Rng & Units 11/29/2023    8:57 AM 11/26/2023    3:55 AM 11/25/2023    6:30 AM  CMP  Glucose 70 - 99 mg/dL 877  859  869   BUN 8 - 23 mg/dL 86  64  64   Creatinine 0.44 - 1.00 mg/dL 7.35  7.66  7.65   Sodium 135 - 145 mmol/L 138  141  142   Potassium 3.5 - 5.1 mmol/L 3.9  3.6  3.7   Chloride 98 - 111 mmol/L 96  99  100   CO2 22 - 32 mmol/L 32  32  32   Calcium  8.9 - 10.3 mg/dL 8.7  8.8  9.0      Imaging studies: No new pertinent imaging  studies   Assessment/Plan: 68 y.o. female 1 Day Post-Op s/p incision and drainage of right axillary abscess.   - Wound Care: Pack wound with saline moistened gauze, cover, secure. This should be done daily and as needed.   - Continue Abx; follow up Cx from 08/26 - Can complete 5 day course, okay to switch to Doxycycline  if needed given renal changes on Bactrim   - Pain control prn   - Further management per primary service  - From a surgical perspective, okay for discharge. Abx and wound care recommendations as above. We will remain available as she is in house   All of the above findings and recommendations were discussed with the patient, and the medical team, and all of patient's questions were answered to her expressed satisfaction.  -- Arthea Platt, PA-C [facility] 12/02/2023, 7:14 AM M-F: 7am - 4pm

## 2023-12-02 NOTE — TOC Progression Note (Signed)
 Transition of Care [facility]) - Progression Note    Patient Details  Name: [name] MRN: [id number] Date of Birth: 06-07-1955  Transition of Care [facility]) CM/SW Contact  Dalia GORMAN Fuse, RN Phone Number: 12/02/2023, 3:23 PM  Clinical Narrative:     Patient remains inpatient for monitoring of creatnine and iv abx. Plan to dc to [facility] tomorrow or when medically appropriate.   Approved Certification# [id number] Dates: 8/25-8/31/2025                     Expected Discharge Plan and Services         Expected Discharge Date: 11/27/23                                     Social Drivers of Health (SDOH) Interventions SDOH Screenings   Food Insecurity: No Food Insecurity (11/11/2023)  Housing: Low Risk  (11/11/2023)  Recent Concern: Housing - High Risk (11/05/2023)  Transportation Needs: No Transportation Needs (11/11/2023)  Utilities: Not At Risk (11/11/2023)  Alcohol Screen: Low Risk  (11/05/2023)  Social Connections: Moderately Isolated (11/11/2023)  Tobacco Use: Medium Risk (12/01/2023)    Readmission Risk Interventions     No data to display

## 2023-12-02 NOTE — Progress Notes (Signed)
 Physical Therapy Treatment/Re-Evaluation Patient Details Name: [name] MRN: [id number] DOB: 08-27-1955 Today's Date: 12/02/2023   History of Present Illness [name] is a 67yoF 11/02/2023 after intentional ingestion of oxycodone  c SI. PMH: MDD, GAD,HTN, AF, DM2, DVT/PE status post IVC filter, HFpEF, single-chamber PPM and chronic pain. Pt recently started on home O2.    PT Comments  Pt received upright in recliner. Unfortunately pt is refusing OOB or recliner activity this date despite encouragement after working with OT earlier today. Albeit pt endorses continued ambulation with therapy and hospital staff which has been pt's current highest level of functional activity while in the hospital. Still reports having SOB and DOE with ambulation with very short room distances. Only tolerating LE therex today with mostly 5# AW for strength training. Pt has significant weakness in hip flexors noted with seated marches which limits pt's foot clearance with gait. Pt left with all needs in reach. Continuing to encourage and educate on importance of OOB mobility and participating with multiple disciplines to maximize functional capacity and prevent further deconditioning. D/c recs remain appropriate. Goals updated.   If plan is discharge home, recommend the following: A little help with walking and/or transfers;A lot of help with bathing/dressing/bathroom;Assistance with cooking/housework;Assist for transportation;Help with stairs or ramp for entrance   Can travel by private vehicle        Equipment Recommendations  Rolling walker (2 wheels);Other (comment) (home O2)    Recommendations for Other Services       Precautions / Restrictions Precautions Precautions: Fall Recall of Precautions/Restrictions: Intact Restrictions Weight Bearing Restrictions Per Provider Order: No     Mobility  Bed Mobility                    Transfers                         Ambulation/Gait                   Stairs             Wheelchair Mobility     Tilt Bed    Modified Rankin (Stroke Patients Only)       Balance Overall balance assessment: Needs assistance Sitting-balance support: Feet supported Sitting balance-[name]: Good                                      Communication Communication Communication: No apparent difficulties  Cognition Arousal: Alert Behavior During Therapy: WFL for tasks assessed/performed   PT - Cognitive impairments: No apparent impairments                         Following commands: Intact      Cueing Cueing Techniques: Verbal cues  Exercises General Exercises - Lower Extremity Ankle Circles/Pumps: AROM, Strengthening, Both, 10 reps, Supine Long Arc Quad: AROM, Strengthening, Both, Seated, Weights (5# AW) Hip ABduction/ADduction: AROM, Strengthening, Both, 10 reps, Supine (5# AW) Straight Leg Raises: AAROM, Strengthening, Both, 10 reps, Supine (5# AW) Hip Flexion/Marching: AROM, Strengthening, Both, 10 reps, Seated (poor ROM seated. Unable to tolerate 5# AW)    General Comments        Pertinent Vitals/Pain Pain Assessment Pain Assessment: No/denies pain    Home Living  Prior Function            PT Goals (current goals can now be found in the care plan section) Acute Rehab PT Goals Patient Stated Goal: get better so I can get out of hospital PT Goal Formulation: With patient Time For Goal Achievement: 12/16/23 Potential to Achieve Goals: Fair Progress towards PT goals: Progressing toward goals    Frequency    Min 2X/week      PT Plan      Co-evaluation              AM-PAC PT 6 Clicks Mobility   Outcome Measure  Help needed turning from your back to your side while in a flat bed without using bedrails?: A Little Help needed moving from lying on your back to sitting on the side of a flat bed  without using bedrails?: A Little Help needed moving to and from a bed to a chair (including a wheelchair)?: A Little Help needed standing up from a chair using your arms (e.g., wheelchair or bedside chair)?: A Little Help needed to walk in hospital room?: A Little Help needed climbing 3-5 steps with a railing? : A Lot 6 Click Score: 17    End of Session Equipment Utilized During Treatment: Oxygen Activity Tolerance: Patient tolerated treatment well Patient left: in chair;with call bell/phone within reach;with chair alarm set Nurse Communication: Mobility status PT Visit Diagnosis: Difficulty in walking, not elsewhere classified (R26.2);Other abnormalities of gait and mobility (R26.89);Muscle weakness (generalized) (M62.81)     Time: 8680-8666 PT Time Calculation (min) (ACUTE ONLY): 14 min  Charges:    $Therapeutic Exercise: 8-22 mins PT General Charges $$ ACUTE PT VISIT: 1 Visit                    Dorina HERO. Fairly IV, PT, DPT Physical Therapist- [place]  [facility] 12/02/2023, 2:38 PM

## 2023-12-02 NOTE — Progress Notes (Signed)
 Progress Note    [name]  FMW:[id number] DOB: 04-03-1956  DOA: 11/11/2023 PCP: Valma Carwin, MD      Brief Narrative:    Medical records reviewed and are as summarized below:  [name] is a 68 y.o. female [name] is a 68 year old female with pulmonary hypertension, hypertension, atrial fibrillation on Eliquis , history of DVT/PE with IVC, heart failure preserved ejection fraction, morbid obesity, CKD stage IV, chronic respiratory failure requiring 2 L nasal cannula at baseline, anxiety, insulin -dependent diabetes mellitus, who presents emergency department on 11/02/2023 for chief concerns of intentional overdose of OxyContin  on 11/01/2023.   08/01: admitted to behavioral health service on 11/06/2023 for the overdose attempt / SI. 08/06: hospitalist consulted for intractable N/V. Vitals at the time of hospitalist consultation showed temperature of 98.2, respiration rate 20, heart rate 50, blood pressure 163/60, SpO2 of 97% on 2 L nasal cannula. She also complained of constipation for about 2 weeks. Patient was also found to have weeping edema from the abdomen and weeping edema from left leg with bloody drainage soaking through left Unna boot. Admitted to hospitalist  08/07: palliative care consulted to clarify code status. DNR felt appropriate. Psychiatry also saw patient, no concerns on their end - continue Celexa  and Abilify , no psychiatric contraindications to discharge at this time  08/08: weeping edema from abdomen. Left Unna boot with visible oozing blood soaking through. Started IV Lasix  + albumin .  08/09-08/17: planning DC to SNF, placement pending and has been difficult to secure  08/18: psychiatry reevaluation - pt is cleared from their standpoint and no need for psychiatric admission.  08/19-08/22: placement pending, bed offer was withdrawn from planned facility  08/23: bedisde I&D by hospitalist for abscess R axilla 08/24: ok for placement [facility]  pending auth  08/25: pt reporting a bit more soreness today in R axilla - US  to evaluate in case gen surg needs to do more extensive I&D --> US  concerning for abscess, gen surg notified and Dr Desiderio will evaluate / possible to OR tomorrow if needed  08/26: to OR tomorrow for I&D, surgery to recheck tm. Foley out today, void trial. Anticipate dc tomorrow to facility         Assessment/Plan:   Principal Problem:   Intractable nausea and vomiting Active Problems:   Polyuria   Essential hypertension   Paroxysmal atrial fibrillation (HCC)   Type 2 diabetes mellitus with stage 3b chronic kidney disease, with long-term current use of insulin  (HCC)   (HFpEF) heart failure with preserved ejection fraction (HCC)   Morbid obesity with BMI of 50.0-59.9, adult (HCC)   LBBB (left bundle branch block)   Hypothyroidism   Pulmonary HTN, Moderate  PA pressure 09/2009   Thrombocytopenia (HCC)   Drug overdose, intentional, initial encounter (HCC)   CKD (chronic kidney disease) stage 4, GFR 15-29 ml/min (HCC)   Abscess of right axilla   Infection    Body mass index is 56.9 kg/m.  (Class III obesity)    Acute on Chronic HFpEF - improved  Recent echo 08/2023 noted EF 60-65%, moderate LVH, normal RV. Recent R/LHC noted no significant CAD, PA pressure 62/16, wedge of 11, PVR 4.4, group 3 pulmonary hypertension suspected Assoc w/ anasarca, ascites, peripheral edema Hold torsemide      AKI on CKD (chronic kidney disease) stage 4, GFR 15-29 ml/min Creatinine up from 2.33-2.64-3.27.  Start low rate IV "Ringer's"  lactate infusion. Baseline creatinine around 1.6-2  Hold torsemide  10 monitor  BMP Nephrology - follow outpatient     R axillary abscess, mild, not resolving/worsening 08/25 Question abscess extension deeper versus enlarged lymph node I&D in OR 12/01/23  Had 7 doses of Bactrim .  Discontinue Bactrim  because of worsening kidney function and start doxycycline     Chronic  Lymphedema Venous stasis ulcers of left lower extremity  She had weeping edema (bloody drainage) from left leg. Continue Unna boots on bilateral lower extremities.Pt will most likely need these chronically as edema / skin weeping recurs with each removal     Intractable nausea and vomiting - resolved Suspect due to severe constipation, had not had BM in 2 weeks. She had a bowel movement on 11/29/2023.  UTI Laxatives and antiemetics as needed. Continue PPI Of note, iron  supplement potentially causing N/V, pt has done fine w/ switch to Niferex so will continue this     Type 2 diabetes mellitus with long-term current use of insulin  Continue insulin  per orders    Paroxysmal atrial fibrillation, bradycardia HR in the 50s not on rate control drugs Continue Eliquis     Hx UTI, Urine culture from 05/05/2023: Showed Enterobacter cloacae, pansensitive UA with only trace leukocytes, rare bacteria, 0-5 WBC's -- not consistent with UTI. Prior to admission patient was started on Keflex  so completed course for total 5 days.    Hyperkalemia Lokelma  given on 8/14.  Resolved Monitor BMP     Hypothyroid TSH 14.9 elevated, increased dose of Synthroid  from 75 to 100 mcg p.o. daily 8/15 Follow-up outpatient to repeat TSH level after 6 weeks.    Drug overdose, intentional, initial encounter  Major depressive disorder, anxiety Continue Celexa  and Abilify  Pt was initially admitted to medical floor from geropsych unit. One-on-one suicide precautions were lifted per psychiatry on 11/12/23. Per psychiatry, pt can be discharged when medically stable, no longer requires psych admission.    OHS/OSA Pulmonary HTN type III Doesn't tolerate CPAP Supplement O2 as needed Weight loss encouraged      History of DVT and PE s/p IVC filter  Eliquis     Hypertension Low diastolic BP Continue amlodipine  Daily vital signs    Anemia of chronic disease Iron  profile, B12 within normal range Monitor  H&H/CBC Of note, iron  supplement was causing N/V, pt has done fine w/ switch to Niferex so will continue this     Intertrigo, abdominal folds Lotrisone  cream twice daily followed by nystatin  powder Applied Lotrisone  cream on bilateral lower extremities well for possible dermatitis associated with chronic venous stasis/lymphedema Continue Lotrisone  for at least 2-4 weeks       Diet Order             Diet heart healthy/carb modified Fluid consistency: Thin  Diet effective now                                  Consultants: Psychiatrist Nephrologist Palliative care  Procedures: 08/23: bedside I&D R axillary abscess  08/26: deeper I&D and irrigation in OR R axillary abscess    Medications:    allopurinol   50 mg Oral QODAY   amLODipine   10 mg Oral Daily   ARIPiprazole   2 mg Oral QHS   Chlorhexidine  Gluconate Cloth  6 each Topical Daily   citalopram   40 mg Oral QHS   clotrimazole -betamethasone    Topical BID   doxycycline   100 mg Oral Q12H   feeding supplement  237 mL Oral BID BM   insulin  aspart  0-15  Units Subcutaneous TID WC   insulin  aspart  0-5 Units Subcutaneous QHS   insulin  glargine-yfgn  20 Units Subcutaneous Daily   iron  polysaccharides  150 mg Oral Daily   levothyroxine   100 mcg Oral QAC breakfast   melatonin  10 mg Oral QHS   multivitamin with minerals  1 tablet Oral Daily   nystatin    Topical BID   pantoprazole   40 mg Oral Daily   polyethylene glycol  17 g Oral Daily   rosuvastatin   10 mg Oral Daily   senna-docusate  1 tablet Oral BID   Continuous Infusions:  lactated ringers  50 mL/hr at 12/02/23 0916   promethazine  (PHENERGAN ) injection (IM or IVPB) 12.5 mg (12/01/23 1118)     Anti-infectives (From admission, onward)    Start     Dose/Rate Route Frequency Ordered Stop   12/02/23 0845  doxycycline  (VIBRA -TABS) tablet 100 mg        100 mg Oral Every 12 hours 12/02/23 0829 12/03/23 1959   11/28/23 2000   sulfamethoxazole -trimethoprim  (BACTRIM  DS) 800-160 MG per tablet 1 tablet  Status:  Discontinued        1 tablet Oral Every 12 hours 11/28/23 1448 12/02/23 0829              Family Communication/Anticipated D/C date and plan/Code Status   DVT prophylaxis: Place TED hose Start: 11/11/23 1406     Code Status: Limited: Do not attempt resuscitation (DNR) -DNR-LIMITED -Do Not Intubate/DNI   Family Communication: None Disposition Plan: Plan to discharge to SNF   Status is: Inpatient Remains inpatient appropriate because: Worsening AKI       Subjective:   Interval events noted.  She complains of frequent urination.  No other complaints.  Objective:    Vitals:   12/02/23 0003 12/02/23 0412 12/02/23 0500 12/02/23 0759  BP: (!) 139/51 133/64  (!) 147/57  Pulse: (!) 50 (!) 50  (!) 50  Resp: 20 20  16   Temp: 98.7 F (37.1 C) 98.6 F (37 C)  98.2 F (36.8 C)  TempSrc: Oral Oral    SpO2: 98% 100%  100%  Weight:   (!) 136.6 kg   Height:       No data found.   Intake/Output Summary (Last 24 hours) at 12/02/2023 1153 Last data filed at 12/02/2023 0900 Gross per 24 hour  Intake 720 ml  Output --  Net 720 ml   Filed Weights   12/01/23 0500 12/01/23 0844 12/02/23 0500  Weight: (!) 141.4 kg (!) 141.4 kg (!) 136.6 kg    Exam:  GEN: NAD SKIN: Warm and dry.  Dressing on right axillary incisional wound looks clean, dry and intact EYES: No pallor or icterus ENT: MMM CV: RRR PULM: CTA B ABD: soft, distended, NT, +BS CNS: AAO x 3, non focal EXT: Peripheral edema, no tenderness        Data Reviewed:   I have personally reviewed following labs and imaging studies:  Labs: Labs show the following:   Basic Metabolic Panel: Recent Labs  Lab 11/26/23 0355 11/29/23 0857 12/02/23 0545  NA 141 138 138  K 3.6 3.9 4.8  CL 99 96* 97*  CO2 32 32 32  GLUCOSE 140* 122* 175*  BUN 64* 86* 93*  CREATININE 2.33* 2.64* 3.27*  CALCIUM  8.8* 8.7* 9.2    GFR Estimated Creatinine Clearance: 22 mL/min (A) (by C-G formula based on SCr of 3.27 mg/dL (H)). Liver Function Tests: No results for input(s): AST, ALT, ALKPHOS, BILITOT, PROT,  ALBUMIN  in the last 168 hours. No results for input(s): LIPASE, AMYLASE in the last 168 hours. No results for input(s): AMMONIA in the last 168 hours. Coagulation profile No results for input(s): INR, PROTIME in the last 168 hours.  CBC: Recent Labs  Lab 11/26/23 0355 11/29/23 0857 12/02/23 0545  WBC 6.6 7.6 7.3  HGB 8.1* 8.0* 8.2*  HCT 26.9* 26.8* 27.0*  MCV 105.1* 103.1* 100.7*  PLT 136* 127* 157   Cardiac Enzymes: No results for input(s): CKTOTAL, CKMB, CKMBINDEX, TROPONINI in the last 168 hours. BNP (last 3 results) No results for input(s): PROBNP in the last 8760 hours. CBG: Recent Labs  Lab 12/01/23 1205 12/01/23 1638 12/01/23 2048 12/02/23 0800 12/02/23 1135  GLUCAP 191* 209* 273* 177* 209*   D-Dimer: No results for input(s): DDIMER in the last 72 hours. Hgb A1c: No results for input(s): HGBA1C in the last 72 hours. Lipid Profile: No results for input(s): CHOL, HDL, LDLCALC, TRIG, CHOLHDL, LDLDIRECT in the last 72 hours. Thyroid  function studies: No results for input(s): TSH, T4TOTAL, T3FREE, THYROIDAB in the last 72 hours.  Invalid input(s): FREET3 Anemia work up: No results for input(s): VITAMINB12, FOLATE, FERRITIN, TIBC, IRON , RETICCTPCT in the last 72 hours. Sepsis Labs: Recent Labs  Lab 11/26/23 0355 11/29/23 0857 12/02/23 0545  WBC 6.6 7.6 7.3    Microbiology Recent Results (from the past 240 hours)  Aerobic/Anaerobic Culture w Gram Stain (surgical/deep wound)     Status: None (Preliminary result)   Collection Time: 12/01/23  9:54 AM   Specimen: Wound; Tissue  Result Value Ref Range Status   Specimen Description   Final    WOUND Performed at [facility], [address],  [place], [place] [address]    Special Requests   Final    RIGHT AXILLA Performed at [facility], [address] [place], [place] [address]    Gram Stain   Final    FEW WBC PRESENT, PREDOMINANTLY PMN NO ORGANISMS SEEN    Culture   Final    NO GROWTH < 12 HOURS Performed at [facility], [address], [place], [place] [address]    Report Status PENDING  Incomplete  Aerobic/Anaerobic Culture w Gram Stain (surgical/deep wound)     Status: None (Preliminary result)   Collection Time: 12/01/23 10:08 AM   Specimen: Wound; Abscess  Result Value Ref Range Status   Specimen Description   Final    WOUND Performed at [facility], [address], [place], [place] [address]    Special Requests   Final    RIGHT AXILLA Performed at [facility], [address] [place], [place] [address]    Gram Stain   Final    FEW WBC PRESENT, PREDOMINANTLY PMN FEW GRAM POSITIVE COCCI    Culture   Final    CULTURE REINCUBATED FOR BETTER GROWTH Performed at [facility], [address] [place], [place] [address]    Report Status PENDING  Incomplete    Procedures and diagnostic studies:  US  RT UPPER EXTREM LTD SOFT TISSUE NON VASCULAR Result Date: 11/30/2023 CLINICAL DATA:  Abscess. Axillary abscess and status post bedside I and D. Evaluate for a persistent abscess. EXAM: ULTRASOUND RIGHT UPPER EXTREMITY LIMITED TECHNIQUE: Ultrasound examination of the upper extremity soft tissues was performed in the area of clinical concern. COMPARISON:  None Available. FINDINGS: Poorly defined heterogeneous hypoechoic area at the area of concern in the right axilla. This area roughly measures 1.9  x 1.0 x 1.6 cm. IMPRESSION: Poorly defined superficial heterogeneous area at the area of concern in the right axilla. This area measures up to 1.9 cm. This could represent a small abscess or phlegmon. Electronically Signed   By: Juliene Balder M.D.   On: 11/30/2023 16:29                LOS: 19 days   Mykia Holton  [facility]   Pager on [email or web address]. If 7PM-7AM, please contact night-coverage at [email or web address]     12/02/2023, 11:53 AM

## 2023-12-03 LAB — GLUCOSE, CAPILLARY
Glucose-Capillary: 132 mg/dL — ABNORMAL HIGH (ref 70–99)
Glucose-Capillary: 131 mg/dL — ABNORMAL HIGH (ref 70–99)
Glucose-Capillary: 135 mg/dL — ABNORMAL HIGH (ref 70–99)
Glucose-Capillary: 171 mg/dL — ABNORMAL HIGH (ref 70–99)

## 2023-12-03 LAB — COMPREHENSIVE METABOLIC PANEL WITH GFR
ALT: 9 U/L (ref 0–44)
AST: 20 U/L (ref 15–41)
Albumin: 3.3 g/dL — ABNORMAL LOW (ref 3.5–5.0)
Alkaline Phosphatase: 59 U/L (ref 38–126)
Anion gap: 9 (ref 5–15)
BUN: 98 mg/dL — ABNORMAL HIGH (ref 8–23)
CO2: 31 mmol/L (ref 22–32)
Calcium: 9.1 mg/dL (ref 8.9–10.3)
Chloride: 98 mmol/L (ref 98–111)
Creatinine, Ser: 3.38 mg/dL — ABNORMAL HIGH (ref 0.44–1.00)
GFR, Estimated: 14 mL/min — ABNORMAL LOW (ref >60)
Glucose, Bld: 111 mg/dL — ABNORMAL HIGH (ref 70–99)
Potassium: 4.6 mmol/L (ref 3.5–5.1)
Sodium: 138 mmol/L (ref 135–145)
Total Bilirubin: 0.7 mg/dL (ref 0.0–1.2)
Total Protein: 5.9 g/dL — ABNORMAL LOW (ref 6.5–8.1)

## 2023-12-03 MED ORDER — LACTATED RINGERS IV SOLN: INTRAVENOUS | Status: AC

## 2023-12-03 MED ORDER — INSULIN GLARGINE 100 UNIT/ML ~~LOC~~ SOLN
20.0000 [IU] | Freq: Every day | SUBCUTANEOUS | Status: DC
  Administered 2023-12-03 – 2023-12-05 (×3): 20 [IU] via SUBCUTANEOUS
  Filled 2023-12-03 (×4): qty 0.2

## 2023-12-03 NOTE — Progress Notes (Signed)
 Progress Note    [name]  FMW:[id number] DOB: [birth year]  DOA: 11/11/2023 PCP: Valma Carwin, MD      Brief Narrative:    Medical records reviewed and are as summarized below:  [name] is a 68 y.o. female [name] is a 68 year old female with pulmonary hypertension, hypertension, atrial fibrillation on Eliquis , history of DVT/PE with IVC, heart failure preserved ejection fraction, morbid obesity, CKD stage IV, chronic respiratory failure requiring 2 L nasal cannula at baseline, anxiety, insulin -dependent diabetes mellitus, who presents emergency department on 11/02/2023 for chief concerns of intentional overdose of OxyContin  on 11/01/2023.   08/01: admitted to behavioral health service on 11/06/2023 for the overdose attempt / SI. 08/06: hospitalist consulted for intractable N/V. Vitals at the time of hospitalist consultation showed temperature of 98.2, respiration rate 20, heart rate 50, blood pressure 163/60, SpO2 of 97% on 2 L nasal cannula. She also complained of constipation for about 2 weeks. Patient was also found to have weeping edema from the abdomen and weeping edema from left leg with bloody drainage soaking through left Unna boot. Admitted to hospitalist  08/07: palliative care consulted to clarify code status. DNR felt appropriate. Psychiatry also saw patient, no concerns on their end - continue Celexa  and Abilify , no psychiatric contraindications to discharge at this time  08/08: weeping edema from abdomen. Left Unna boot with visible oozing blood soaking through. Started IV Lasix  + albumin .  08/09-08/17: planning DC to SNF, placement pending and has been difficult to secure  08/18: psychiatry reevaluation - pt is cleared from their standpoint and no need for psychiatric admission.  08/19-08/22: placement pending, bed offer was withdrawn from planned facility  08/23: bedisde I&D by hospitalist for abscess R axilla 08/24: ok for placement [facility]  pending auth  08/25: pt reporting a bit more soreness today in R axilla - US  to evaluate in case gen surg needs to do more extensive I&D --> US  concerning for abscess, gen surg notified and Dr Desiderio will evaluate / possible to OR tomorrow if needed  08/26: to OR tomorrow for I&D, surgery to recheck tm. Foley out today, void trial. Anticipate dc tomorrow to facility         Assessment/Plan:   Principal Problem:   Intractable nausea and vomiting Active Problems:   Polyuria   Essential hypertension   Paroxysmal atrial fibrillation (HCC)   Type 2 diabetes mellitus with stage 3b chronic kidney disease, with long-term current use of insulin  (HCC)   (HFpEF) heart failure with preserved ejection fraction (HCC)   Morbid obesity with BMI of 50.0-59.9, adult (HCC)   LBBB (left bundle branch block)   Hypothyroidism   Pulmonary HTN, Moderate  PA pressure 09/2009   Thrombocytopenia (HCC)   Drug overdose, intentional, initial encounter (HCC)   CKD (chronic kidney disease) stage 4, GFR 15-29 ml/min (HCC)   Abscess of right axilla   Infection    Body mass index is 58.11 kg/m.  (Class III obesity)    Acute on Chronic HFpEF - improved  Recent echo 08/2023 noted EF 60-65%, moderate LVH, normal RV. Recent R/LHC noted no significant CAD, PA pressure 62/16, wedge of 11, PVR 4.4, group 3 pulmonary hypertension suspected Assoc w/ anasarca, ascites, peripheral edema Hold torsemide      AKI on CKD (chronic kidney disease) stage 4, GFR 15-29 ml/min Creatinine up from 2.33-2.64-3.27-3.38.  Suspect recent use of Bactrim  contributed to AKI.  She was also on torsemide . Continue low  rate IV fluids.. Baseline creatinine around 1.6-2  Hold torsemide  and monitor Nephrology - follow outpatient     R axillary abscess, mild, not resolving/worsening 08/25 Question abscess extension deeper versus enlarged lymph node I&D in OR 12/01/23  Had 7 doses of Bactrim .  Bactrim  discontinued on 12/02/2023.   Completed 3 doses of doxycycline  on 12/03/2023    Chronic Lymphedema Venous stasis ulcers of left lower extremity  She had weeping edema (bloody drainage) from left leg. Continue Unna boots on bilateral lower extremities.Pt will most likely need these chronically as edema / skin weeping recurs with each removal     Intractable nausea and vomiting - resolved Suspect due to severe constipation, had not had BM in 2 weeks. She had a bowel movement on 11/29/2023.  UTI Laxatives and antiemetics as needed. Continue PPI Of note, iron  supplement potentially causing N/V, pt has done fine w/ switch to Niferex so will continue this     Type 2 diabetes mellitus with long-term current use of insulin  Continue insulin  per orders    Paroxysmal atrial fibrillation, bradycardia HR in the 50s not on rate control drugs Continue Eliquis     Hx UTI, Urine culture from 05/05/2023: Showed Enterobacter cloacae, pansensitive UA with only trace leukocytes, rare bacteria, 0-5 WBC's -- not consistent with UTI. Prior to admission patient was started on Keflex  so completed course for total 5 days.    Hyperkalemia Lokelma  given on 8/14.  Resolved Monitor BMP     Hypothyroid TSH 14.9 elevated, increased dose of Synthroid  from 75 to 100 mcg p.o. daily 8/15 Follow-up outpatient to repeat TSH level after 6 weeks.    Drug overdose, intentional, initial encounter  Major depressive disorder, anxiety Continue Celexa  and Abilify  Pt was initially admitted to medical floor from geropsych unit. One-on-one suicide precautions were lifted per psychiatry on 11/12/23. Per psychiatry, pt can be discharged when medically stable, no longer requires psych admission.    OHS/OSA Pulmonary HTN type III Doesn't tolerate CPAP Supplement O2 as needed Weight loss encouraged      History of DVT and PE s/p IVC filter  Eliquis     Hypertension Low diastolic BP Continue amlodipine  Daily vital signs    Anemia of chronic  disease Iron  profile, B12 within normal range Monitor H&H/CBC Of note, iron  supplement was causing N/V, pt has done fine w/ switch to Niferex so will continue this     Intertrigo, abdominal folds Lotrisone  cream twice daily followed by nystatin  powder Applied Lotrisone  cream on bilateral lower extremities well for possible dermatitis associated with chronic venous stasis/lymphedema Continue Lotrisone  for at least 2-4 weeks       Diet Order             Diet heart healthy/carb modified Fluid consistency: Thin  Diet effective now                                  Consultants: Psychiatrist Nephrologist Palliative care  Procedures: 08/23: bedside I&D R axillary abscess  08/26: deeper I&D and irrigation in OR R axillary abscess    Medications:    allopurinol   50 mg Oral QODAY   amLODipine   10 mg Oral Daily   ARIPiprazole   2 mg Oral QHS   Chlorhexidine  Gluconate Cloth  6 each Topical Daily   citalopram   40 mg Oral QHS   clotrimazole -betamethasone    Topical BID   feeding supplement  237 mL Oral BID BM  insulin  aspart  0-15 Units Subcutaneous TID WC   insulin  aspart  0-5 Units Subcutaneous QHS   insulin  glargine-yfgn  20 Units Subcutaneous Daily   iron  polysaccharides  150 mg Oral Daily   levothyroxine   100 mcg Oral QAC breakfast   melatonin  10 mg Oral QHS   multivitamin with minerals  1 tablet Oral Daily   nystatin    Topical BID   pantoprazole   40 mg Oral Daily   polyethylene glycol  17 g Oral Daily   rosuvastatin   10 mg Oral Daily   senna-docusate  1 tablet Oral BID   Continuous Infusions:  lactated ringers  50 mL/hr at 12/03/23 0855   promethazine  (PHENERGAN ) injection (IM or IVPB) Stopped (12/01/23 1155)     Anti-infectives (From admission, onward)    Start     Dose/Rate Route Frequency Ordered Stop   12/02/23 0845  doxycycline  (VIBRA -TABS) tablet 100 mg        100 mg Oral Every 12 hours 12/02/23 0829 12/03/23 1104   11/28/23 2000   sulfamethoxazole -trimethoprim  (BACTRIM  DS) 800-160 MG per tablet 1 tablet  Status:  Discontinued        1 tablet Oral Every 12 hours 11/28/23 1448 12/02/23 0829              Family Communication/Anticipated D/C date and plan/Code Status   DVT prophylaxis: Place TED hose Start: 11/11/23 1406     Code Status: Limited: Do not attempt resuscitation (DNR) -DNR-LIMITED -Do Not Intubate/DNI   Family Communication: None Disposition Plan: Plan to discharge to SNF   Status is: Inpatient Remains inpatient appropriate because: Worsening AKI       Subjective:   Interval events noted.  She said she felt very short of breath when they attempted to wean off oxygen.  No other complaints.  Objective:    Vitals:   12/03/23 0500 12/03/23 0619 12/03/23 0619 12/03/23 0854  BP:  (!) 135/53 (!) 135/53 (!) 158/65  Pulse:  (!) 45 (!) 50 (!) 49  Resp:  15 15 15   Temp:  98 F (36.7 C) 98 F (36.7 C) (!) 97.5 F (36.4 C)  TempSrc:      SpO2:  100% 100% 94%  Weight: (!) 139.5 kg     Height:       No data found.   Intake/Output Summary (Last 24 hours) at 12/03/2023 1453 Last data filed at 12/03/2023 1003 Gross per 24 hour  Intake 842.42 ml  Output --  Net 842.42 ml   Filed Weights   12/01/23 0844 12/02/23 0500 12/03/23 0500  Weight: (!) 141.4 kg (!) 136.6 kg (!) 139.5 kg    Exam:  GEN: NAD SKIN: Warm and dry EYES: No pallor or icterus ENT: MMM CV: RRR PULM: CTA B ABD: soft, obese and distended, NT, +BS CNS: AAO x 3, non focal EXT: Bilateral lower extremity edema         Data Reviewed:   I have personally reviewed following labs and imaging studies:  Labs: Labs show the following:   Basic Metabolic Panel: Recent Labs  Lab 11/29/23 0857 12/02/23 0545 12/03/23 0534  NA 138 138 138  K 3.9 4.8 4.6  CL 96* 97* 98  CO2 32 32 31  GLUCOSE 122* 175* 111*  BUN 86* 93* 98*  CREATININE 2.64* 3.27* 3.38*  CALCIUM  8.7* 9.2 9.1   GFR Estimated Creatinine  Clearance: 21.5 mL/min (A) (by C-G formula based on SCr of 3.38 mg/dL (H)). Liver Function Tests: Recent Labs  Lab 12/03/23 0534  AST 20  ALT 9  ALKPHOS 59  BILITOT 0.7  PROT 5.9*  ALBUMIN  3.3*   No results for input(s): LIPASE, AMYLASE in the last 168 hours. No results for input(s): AMMONIA in the last 168 hours. Coagulation profile No results for input(s): INR, PROTIME in the last 168 hours.  CBC: Recent Labs  Lab 11/29/23 0857 12/02/23 0545  WBC 7.6 7.3  HGB 8.0* 8.2*  HCT 26.8* 27.0*  MCV 103.1* 100.7*  PLT 127* 157   Cardiac Enzymes: No results for input(s): CKTOTAL, CKMB, CKMBINDEX, TROPONINI in the last 168 hours. BNP (last 3 results) No results for input(s): PROBNP in the last 8760 hours. CBG: Recent Labs  Lab 12/02/23 1640 12/02/23 1932 12/02/23 2114 12/03/23 0856 12/03/23 1156  GLUCAP 182* 184* 167* 132* 131*   D-Dimer: No results for input(s): DDIMER in the last 72 hours. Hgb A1c: No results for input(s): HGBA1C in the last 72 hours. Lipid Profile: No results for input(s): CHOL, HDL, LDLCALC, TRIG, CHOLHDL, LDLDIRECT in the last 72 hours. Thyroid  function studies: No results for input(s): TSH, T4TOTAL, T3FREE, THYROIDAB in the last 72 hours.  Invalid input(s): FREET3 Anemia work up: No results for input(s): VITAMINB12, FOLATE, FERRITIN, TIBC, IRON , RETICCTPCT in the last 72 hours. Sepsis Labs: Recent Labs  Lab 11/29/23 0857 12/02/23 0545  WBC 7.6 7.3    Microbiology Recent Results (from the past 240 hours)  Aerobic/Anaerobic Culture w Gram Stain (surgical/deep wound)     Status: None (Preliminary result)   Collection Time: 12/01/23  9:54 AM   Specimen: Wound; Tissue  Result Value Ref Range Status   Specimen Description   Final    WOUND Performed at [facility], [address], [place], [place] [address]    Special Requests   Final    RIGHT AXILLA Performed at  [facility], [address] [place], [place] [address]    Gram Stain   Final    FEW WBC PRESENT, PREDOMINANTLY PMN NO ORGANISMS SEEN Performed at [facility], [address], [place], [place] [address]    Culture   Final    FEW STAPHYLOCOCCUS AUREUS SUSCEPTIBILITIES TO FOLLOW NO ANAEROBES ISOLATED; CULTURE IN PROGRESS FOR 5 DAYS    Report Status PENDING  Incomplete  Aerobic/Anaerobic Culture w Gram Stain (surgical/deep wound)     Status: None (Preliminary result)   Collection Time: 12/01/23 10:08 AM   Specimen: Wound; Abscess  Result Value Ref Range Status   Specimen Description   Final    WOUND Performed at [facility], [address], [place], [place] [address]    Special Requests   Final    RIGHT AXILLA Performed at [facility], [address] [place], [place] [address]    Gram Stain   Final    FEW WBC PRESENT, PREDOMINANTLY PMN FEW GRAM POSITIVE COCCI Performed at [facility], [address], [place], [place] [address]    Culture   Final    ABUNDANT STAPHYLOCOCCUS AUREUS SUSCEPTIBILITIES TO FOLLOW NO ANAEROBES ISOLATED; CULTURE IN PROGRESS FOR 5 DAYS    Report Status PENDING  Incomplete    Procedures and diagnostic studies:  No results found.              LOS: 20 days   Tonesha Tsou  [facility]   Pager on [email or web address]. If 7PM-7AM, please contact night-coverage at [email or web address]     12/03/2023, 2:53 PM

## 2023-12-03 NOTE — Plan of Care (Signed)

## 2023-12-03 NOTE — Progress Notes (Signed)
 12/03/2023  Subjective: Patient is 2 Days Post-Op s/p I&D and debridement of right axillary abscess.  No acute events.  Pt Cr is a bit more elevated to 3.38.  Denies any worsening pain.  Vital signs: Temp:  [98 F (36.7 C)-98.7 F (37.1 C)] 98 F (36.7 C) (08/28 0619) Pulse Rate:  [45-50] 50 (08/28 0619) Resp:  [15-18] 15 (08/28 0619) BP: (135-154)/(53-61) 135/53 (08/28 0619) SpO2:  [95 %-100 %] 100 % (08/28 0619) Weight:  [139.5 kg] 139.5 kg (08/28 0500)   Intake/Output: 08/27 0701 - 08/28 0700 In: 1322.4 [P.O.:960; I.V.:312.4; IV Piggyback:50] Out: -  Last BM Date : 12/01/23  Physical Exam: Constitutional:  No acute distress Skin:  Right axillary wound stable in size, without necrotic edges, without purulence.  Wet to dry dressing applied.  Labs:  Recent Labs    12/02/23 0545  WBC 7.3  HGB 8.2*  HCT 27.0*  PLT 157   Recent Labs    12/02/23 0545 12/03/23 0534  NA 138 138  K 4.8 4.6  CL 97* 98  CO2 32 31  GLUCOSE 175* 111*  BUN 93* 98*  CREATININE 3.27* 3.38*  CALCIUM  9.2 9.1   No results for input(s): LABPROT, INR in the last 72 hours.  Imaging: No results found.  Assessment/Plan: This is a 68 y.o. female s/p I&D and debridement of right axillary wound.  --Continue BID dressing changes for now with wet to dry gauze.   --Cultures pending, currently showing GPC's.  Patient was changed from Bactrim  to Doxy given AKI. --Wound is stable, dressing change orders in place.  Surgery team will sign off but we're available if any questions or concerns. --Dressing instructions for SNF written in chart.  Follow up info in chart.   Aloysius Sheree Plant, MD [facility]

## 2023-12-04 LAB — BASIC METABOLIC PANEL WITH GFR
Anion gap: 9 (ref 5–15)
BUN: 110 mg/dL — ABNORMAL HIGH (ref 8–23)
CO2: 30 mmol/L (ref 22–32)
Calcium: 8.5 mg/dL — ABNORMAL LOW (ref 8.9–10.3)
Chloride: 98 mmol/L (ref 98–111)
Creatinine, Ser: 3.63 mg/dL — ABNORMAL HIGH (ref 0.44–1.00)
GFR, Estimated: 13 mL/min — ABNORMAL LOW (ref >60)
Glucose, Bld: 132 mg/dL — ABNORMAL HIGH (ref 70–99)
Potassium: 3.9 mmol/L (ref 3.5–5.1)
Sodium: 137 mmol/L (ref 135–145)

## 2023-12-04 LAB — GLUCOSE, CAPILLARY
Glucose-Capillary: 162 mg/dL — ABNORMAL HIGH (ref 70–99)
Glucose-Capillary: 174 mg/dL — ABNORMAL HIGH (ref 70–99)
Glucose-Capillary: 185 mg/dL — ABNORMAL HIGH (ref 70–99)
Glucose-Capillary: 180 mg/dL — ABNORMAL HIGH (ref 70–99)
Glucose-Capillary: 159 mg/dL — ABNORMAL HIGH (ref 70–99)

## 2023-12-04 MED ORDER — DOXYCYCLINE HYCLATE 100 MG PO TABS
100.0000 mg | ORAL_TABLET | Freq: Two times a day (BID) | ORAL | Status: DC
  Administered 2023-12-04 – 2023-12-06 (×5): 100 mg via ORAL
  Filled 2023-12-04 (×5): qty 1

## 2023-12-04 NOTE — Progress Notes (Signed)
   12/04/23 0156  Assess: MEWS Score  Temp 98.1 F (36.7 C)  BP (!) 140/45  MAP (mmHg) 73  Pulse Rate (!) 50  Resp (!) 24  Level of Consciousness Alert  SpO2 100 %  O2 Device Nasal Cannula  O2 Flow Rate (L/min) 2 L/min  Assess: MEWS Score  MEWS Temp 0  MEWS Systolic 0  MEWS Pulse 1  MEWS RR 1  MEWS LOC 0  MEWS Score 2  MEWS Score Color Yellow  Assess: if the MEWS score is Yellow or Red  Were vital signs accurate and taken at a resting state? Yes  MEWS guidelines implemented  Yes, yellow  Treat  MEWS Interventions Considered administering scheduled or prn medications/treatments as ordered  Take Vital Signs  Increase Vital Sign Frequency  Yellow: Q2hr x1, continue Q4hrs until patient remains green for 12hrs  Escalate  MEWS: Escalate Yellow: Discuss with charge nurse and consider notifying provider and/or RRT  Notify: Charge Nurse/RN  Name of Charge Nurse/RN Notified Jackie,Rn  Assess: SIRS CRITERIA  SIRS Temperature  0  SIRS Respirations  1  SIRS Pulse 0  SIRS WBC 0  SIRS Score Sum  1

## 2023-12-04 NOTE — Progress Notes (Signed)
 Physical Therapy Treatment Patient Details Name: [name] MRN: [id number] DOB: 11-13-1955 Today's Date: 12/04/2023   History of Present Illness [name] is a 67yoF 11/02/2023 after intentional ingestion of oxycodone  c SI. PMH: MDD, GAD,HTN, AF, DM2, DVT/PE status post IVC filter, HFpEF, single-chamber PPM and chronic pain. Pt recently started on home O2.    PT Comments  Pt received upright in bed reporting need for toileting. Pt needs minA+1 for bed mobility with bed features. Stands and ambulates at supervision using RW to bathroom. Pt urinates and passes BM. Attempts pericare but needs assist for thoroughness. Pt ambulates from bathroom to EOB needing seated rest break. Assisted donning new gown then stands and ambulates to recliner. Pt is still vastly limited by her endurance and DOE with ambulation. Pt left in recliner with all needs in reach. D/c recs remain appropriate.    If plan is discharge home, recommend the following: A little help with walking and/or transfers;A lot of help with bathing/dressing/bathroom;Assistance with cooking/housework;Assist for transportation;Help with stairs or ramp for entrance   Can travel by private vehicle        Equipment Recommendations  Rolling walker (2 wheels);Other (comment)    Recommendations for Other Services       Precautions / Restrictions Precautions Precautions: Fall Recall of Precautions/Restrictions: Intact Restrictions Weight Bearing Restrictions Per Provider Order: No     Mobility  Bed Mobility Overal bed mobility: Needs Assistance Bed Mobility: Supine to Sit     Supine to sit: HOB elevated, Used rails, Min assist       Patient Response: Cooperative  Transfers Overall transfer level: Needs assistance Equipment used: Rolling walker (2 wheels) Transfers: Sit to/from Stand Sit to Stand: Supervision                Ambulation/Gait Ambulation/Gait assistance: Supervision Gait Distance (Feet): 40  Feet Assistive device: Rolling walker (2 wheels) Gait Pattern/deviations: Step-through pattern       General Gait Details: Pt ambulates from EOB to bathroom then from bathroom to recliner.   Stairs             Wheelchair Mobility     Tilt Bed Tilt Bed Patient Response: Cooperative  Modified Rankin (Stroke Patients Only)       Balance Overall balance assessment: Needs assistance Sitting-balance support: Feet supported Sitting balance-[name]: Good     Standing balance support: During functional activity, Single extremity supported, Bilateral upper extremity supported Standing balance-[name]: Fair                              Hotel manager: No apparent difficulties  Cognition Arousal: Alert Behavior During Therapy: WFL for tasks assessed/performed   PT - Cognitive impairments: No apparent impairments                         Following commands: Intact      Cueing Cueing Techniques: Verbal cues  Exercises      General Comments        Pertinent Vitals/Pain Pain Assessment Pain Assessment: No/denies pain    Home Living                          Prior Function            PT Goals (current goals can now be found in the care plan section) Acute Rehab PT  Goals Patient Stated Goal: get better so I can get out of hospital PT Goal Formulation: With patient Time For Goal Achievement: 12/16/23 Potential to Achieve Goals: Fair Progress towards PT goals: Progressing toward goals    Frequency    Min 2X/week      PT Plan      Co-evaluation              AM-PAC PT 6 Clicks Mobility   Outcome Measure  Help needed turning from your back to your side while in a flat bed without using bedrails?: A Little Help needed moving from lying on your back to sitting on the side of a flat bed without using bedrails?: A Little Help needed moving to and from a bed to a chair (including  a wheelchair)?: A Little Help needed standing up from a chair using your arms (e.g., wheelchair or bedside chair)?: A Little Help needed to walk in hospital room?: A Little Help needed climbing 3-5 steps with a railing? : A Lot 6 Click Score: 17    End of Session Equipment Utilized During Treatment: Oxygen Activity Tolerance: Patient tolerated treatment well Patient left: in chair;with call bell/phone within reach;with chair alarm set Nurse Communication: Mobility status PT Visit Diagnosis: Difficulty in walking, not elsewhere classified (R26.2);Other abnormalities of gait and mobility (R26.89);Muscle weakness (generalized) (M62.81)     Time: 8567-8540 PT Time Calculation (min) (ACUTE ONLY): 27 min  Charges:    $Therapeutic Exercise: 23-37 mins PT General Charges $$ ACUTE PT VISIT: 1 Visit                     Dorina HERO. Fairly IV, PT, DPT Physical Therapist- [place]  [facility] 12/04/2023, 4:09 PM

## 2023-12-04 NOTE — Progress Notes (Signed)
 [facility]  PROGRESS NOTE   Subjective:   We have reconsulted due to worsening renal function. At time of sign off, patient renal function had remained at baseline for several days during diuresis. Patient was transitioned onto oral diuretic once we signed off.   Patient's creatinine has risen to  3.63 today. Appears patient was started on Bactrim  on 8/24 for abscess. She underwent I&D of abscess on 8/26 with general anesthesia. Torsemide  and Bactrim  were held on Wednesday due to increasing creatinine. Gentle IV hydration was started yesterday.    Objective:  Vital signs: Blood pressure (!) 132/38, pulse (!) 50, temperature 98.6 F (37 C), resp. rate 19, height 5' 1 (1.549 m), weight (!) 138.9 kg, SpO2 100%.  Intake/Output Summary (Last 24 hours) at 12/04/2023 1149 Last data filed at 12/04/2023 0900 Gross per 24 hour  Intake 1591.79 ml  Output --  Net 1591.79 ml   Filed Weights   12/02/23 0500 12/03/23 0500 12/04/23 0440  Weight: (!) 136.6 kg (!) 139.5 kg (!) 138.9 kg     Physical Exam: General:  No acute distress  Head:  Normocephalic, atraumatic. Moist oral mucosal membranes  Eyes:  Anicteric  Lungs:   Clear to auscultation, normal effort, 2L [place]  Heart:  S1S2 no rubs  Abdomen:   Soft, nontender, bowel sounds present  Extremities: 1+ peripheral edema.  Neurologic:  Awake, alert, following commands  Skin:  No lesions  Access: None    Basic Metabolic Panel: Recent Labs  Lab 11/29/23 0857 12/02/23 0545 12/03/23 0534 12/04/23 0434  NA 138 138 138 137  K 3.9 4.8 4.6 3.9  CL 96* 97* 98 98  CO2 32 32 31 30  GLUCOSE 122* 175* 111* 132*  BUN 86* 93* 98* 110*  CREATININE 2.64* 3.27* 3.38* 3.63*  CALCIUM  8.7* 9.2 9.1 8.5*   GFR: Estimated Creatinine Clearance: 20 mL/min (A) (by C-G formula based on SCr of 3.63 mg/dL (H)).  Liver Function Tests: Recent Labs  Lab 12/03/23 0534  AST 20  ALT 9  ALKPHOS 59  BILITOT 0.7  PROT 5.9*  ALBUMIN  3.3*    No results for input(s): LIPASE, AMYLASE in the last 168 hours. No results for input(s): AMMONIA in the last 168 hours.  CBC: Recent Labs  Lab 11/29/23 0857 12/02/23 0545  WBC 7.6 7.3  HGB 8.0* 8.2*  HCT 26.8* 27.0*  MCV 103.1* 100.7*  PLT 127* 157     HbA1C: Hgb A1c MFr Bld  Date/Time Value Ref Range Status  09/04/2023 04:06 AM 5.7 (H) 4.8 - 5.6 % Final    Comment:    (NOTE) Diagnosis of Diabetes The following HbA1c ranges recommended by the American Diabetes Association (ADA) may be used as an aid in the diagnosis of diabetes mellitus.  Hemoglobin             Suggested A1C NGSP%              Diagnosis  <5.7                   Non Diabetic  5.7-6.4                Pre-Diabetic  >6.4                   Diabetic  <7.0                   Glycemic control for  adults with diabetes.    09/09/2021 04:35 AM 6.0 (H) 4.8 - 5.6 % Final    Comment:    (NOTE) Pre diabetes:          5.7%-6.4%  Diabetes:              >6.4%  Glycemic control for   <7.0% adults with diabetes     Urinalysis: No results for input(s): COLORURINE, LABSPEC, PHURINE, GLUCOSEU, HGBUR, BILIRUBINUR, KETONESUR, PROTEINUR, UROBILINOGEN, NITRITE, LEUKOCYTESUR in the last 72 hours.  Invalid input(s): APPERANCEUR    Imaging: No results found.    Medications:    promethazine  (PHENERGAN ) injection (IM or IVPB) Stopped (12/01/23 1155)    allopurinol   50 mg Oral QODAY   amLODipine   10 mg Oral Daily   ARIPiprazole   2 mg Oral QHS   Chlorhexidine  Gluconate Cloth  6 each Topical Daily   citalopram   40 mg Oral QHS   clotrimazole -betamethasone    Topical BID   doxycycline   100 mg Oral Q12H   feeding supplement  237 mL Oral BID BM   insulin  aspart  0-15 Units Subcutaneous TID WC   insulin  aspart  0-5 Units Subcutaneous QHS   insulin  glargine  20 Units Subcutaneous Daily   iron  polysaccharides  150 mg Oral Daily   levothyroxine   100 mcg Oral QAC  breakfast   melatonin  10 mg Oral QHS   multivitamin with minerals  1 tablet Oral Daily   nystatin    Topical BID   pantoprazole   40 mg Oral Daily   polyethylene glycol  17 g Oral Daily   rosuvastatin   10 mg Oral Daily   senna-docusate  1 tablet Oral BID    Assessment/ Plan:     68 y.o. female with a PMHx of hypertension, coronary artery disease, atrial fibrillation, congestive heart failure, DVT/PE, history of IVC placement, morbid obesity, diabetes, peripheral vascular disease, chronic kidney disease admitted with history of intentional overdose of OxyContin .  She has been on diuretics.  Now she is found to have worsening renal insufficiency and so renal evaluation is requested.  She has been on torsemide  at 40 mg twice a day.  She has been fluid and dietary noncompliant     Principal Problem:   Intractable nausea and vomiting Active Problems:   Morbid obesity with BMI of 50.0-59.9, adult (HCC)   Essential hypertension   Pulmonary HTN, Moderate  PA pressure 09/2009   LBBB (left bundle branch block)   Thrombocytopenia (HCC)   Paroxysmal atrial fibrillation (HCC)   Type 2 diabetes mellitus with stage 3b chronic kidney disease, with long-term current use of insulin  (HCC)   Hypothyroidism   (HFpEF) heart failure with preserved ejection fraction (HCC)   Drug overdose, intentional, initial encounter (HCC)   Polyuria   CKD (chronic kidney disease) stage 4, GFR 15-29 ml/min (HCC)     #1: Acute kidney injury on chronic kidney disease stage IV: Acute kidney injury appears multifactorial, ATN from Bactrim  and diuresis. Patient has chronic kidney disease most likely secondary to diabetic kidney disease complicated by decreased cardiac output during the ischemic renal disease.  Renal ultrasound showed 2.2 cm renal mass, was awaiting outpatient urology follow up.   Creatinine has steadily increased since the introduction of Bactrim  This has been stopped and changed to doxycycline . Torsemide   also held. Agree with gentle hydration but will stop once she has received this IV bag. BUN also elevated, questionable steroid use. Will monitor for now. Avoid nephrotoxic agents, therapies, and hypotension.     #  2: Hypertension with chronic kidney disease. Continue amlodipine  at 10 mg daily. Torsemide  held in setting of kidney injury   #3: Diabetes mellitus type II with chronic kidney disease/renal manifestations: insulin  dependent. Home regimen includes Novolog  and Lantus . Most recent hemoglobin A1c is 5.7 on 09/04/23.      Glucose well controlled   #4: Renal mass: Patient is scheduled for CT scan and is scheduled to follow-up with urology.   #5: Anemia with chronic kidney disease: Continue iron  supplementation. Hgb 8.2    LOS: [facility] 8/29/202511:49 AM

## 2023-12-04 NOTE — TOC Progression Note (Signed)
 Transition of Care [facility]) - Progression Note    Patient Details  Name: [name] MRN: [id number] Date of Birth: 1955-11-30  Transition of [facility]) CM/SW Contact  Dalia GORMAN Fuse, RN Phone Number: 12/04/2023, 2:07 PM  Clinical Narrative:    TOC received call from Madelin Jacobus with [facility]) advising the facility can accept admissions over the weekend. TOC will need to call the central admitting office and speak with with Etta [phone].                     Expected Discharge Plan and Services         Expected Discharge Date: 11/27/23                                     Social Drivers of Health (SDOH) Interventions SDOH Screenings   Food Insecurity: No Food Insecurity (11/11/2023)  Housing: Low Risk  (11/11/2023)  Recent Concern: Housing - High Risk (11/05/2023)  Transportation Needs: No Transportation Needs (11/11/2023)  Utilities: Not At Risk (11/11/2023)  Alcohol Screen: Low Risk  (11/05/2023)  Social Connections: Moderately Isolated (11/11/2023)  Tobacco Use: Medium Risk (12/01/2023)    Readmission Risk Interventions     No data to display

## 2023-12-04 NOTE — Plan of Care (Signed)

## 2023-12-04 NOTE — Progress Notes (Signed)
 Initial Nutrition Assessment  DOCUMENTATION CODES:   Morbid obesity  INTERVENTION:   -D/c Ensure Plus High Protein po BID, each supplement provides 350 kcal and 20 grams of protein  -MVI with minerals daily -Double protein portions with meals -Liberalize diet to carb modified for wider variety of meal selections  NUTRITION DIAGNOSIS:   Increased nutrient needs related to post-op healing as evidenced by estimated needs.  GOAL:   Patient will meet greater than or equal to 90% of their needs  MONITOR:   PO intake  REASON FOR ASSESSMENT:   Malnutrition Screening Tool    ASSESSMENT:   Pt with pulmonary hypertension, hypertension, atrial fibrillation on Eliquis , history of DVT/PE with IVC, heart failure preserved ejection fraction, morbid obesity, CKD stage IV, chronic respiratory failure requiring 2 L nasal cannula at baseline, anxiety, insulin -dependent diabetes mellitus, who presented on 11/02/2023 for chief concerns of intentional overdose of OxyContin  on 11/01/2023.  Pt admitted with intractable nausea and vomiting and acute on chronic CHF.   8/1- admitted to behavioral health service on 11/06/2023 for the overdose attempt / SI.  8/6- hospitalist consulted for intractable N/V 8/18- psychiatry reevaluation - pt is cleared from their standpoint and no need for psychiatric admission 8/23- s/p bedisde I&D by hospitalist for abscess R axilla  8/25- more soreness in R axilla, ultrasound concerning for abscess 8/26- s/p I&D to rt axilla  Reviewed I/O's: +1.1 L x 24 hours and +9.2 L since 11/20/23   Pt lying in bed at time of visit. Pt receiving personal care at time of visit.   Pt in a heart healthy, carb modified diet. Noted meal completions 45-100%. Pt is refusing Ensure supplements ordered.   Per nephrology notes, plan for AKI secondary to aggressive diuretics. No acute indication for HD.   Wt has been stable over the past 3 months.   Per TOC notes, monitoing of creatinine  and IV antibiotics are barriers to discharge. Plan to discharge to SNF once medically stable.   Obesity is a complex, chronic medical condition that is optimally managed by a multidisciplinary care team. Weight loss is not an ideal goal for an acute inpatient hospitalization. However, if further work-up for obesity is warranted, consider outpatient referral to [facility].    Medications reviewed and include celexa , protonix , senokot, and miralax .   Lab Results  Component Value Date   HGBA1C 5.7 (H) 09/04/2023   PTA DM medications are 20 units insulin  glargine daily and 6 units insulin  aspart TID.   Labs reviewed: CBGS: 131-185 (inpatient orders for glycemic control are 0-15 units insulin  aspart TID with meals, 0-5 units insulin  aspart, and 20 units insulin  glargine daily).    NUTRITION - FOCUSED PHYSICAL EXAM:  Flowsheet Row Most Recent Value  Orbital Region No depletion  Upper Arm Region No depletion  Thoracic and Lumbar Region No depletion  Buccal Region No depletion  Temple Region No depletion  Clavicle Bone Region No depletion  Clavicle and Acromion Bone Region No depletion  Scapular Bone Region No depletion  Dorsal Hand No depletion  Patellar Region No depletion  Anterior Thigh Region No depletion  Posterior Calf Region No depletion  Edema (RD Assessment) Moderate  Hair Reviewed  Eyes Reviewed  Mouth Reviewed  Skin Reviewed  Nails Reviewed    Diet Order:   Diet Order             Diet Carb Modified Fluid consistency: Thin  Diet effective now  EDUCATION NEEDS:   No education needs have been identified at this time  Skin:  Skin Assessment: Skin Integrity Issues: Skin Integrity Issues:: Incisions, Other (Comment) Incisions: closed rt axilla Other: blister to rt pretibial, venous stasis ulcer to lt pretibial  Last BM:  12/03/23  Height:   Ht Readings from Last 1 Encounters:  12/01/23 5' 1  (1.549 m)    Weight:   Wt Readings from Last 1 Encounters:  12/04/23 (!) 138.9 kg    Ideal Body Weight:  47.7 kg  BMI:  Body mass index is 57.86 kg/m.  Estimated Nutritional Needs:   Kcal:  1650-1850  Protein:  80-95 grams  Fluid:  1.6-1.8 L    Margery ORN, RD, LDN, CDCES Registered Dietitian III Certified Diabetes Care and Education Specialist If unable to reach this RD, please use RD Inpatient group chat on secure chat between hours of 8am-4 pm daily

## 2023-12-04 NOTE — Progress Notes (Signed)
 Progress Note    [name]  FMW:[id number] DOB: Jul 04, 1955  DOA: 11/11/2023 PCP: Valma Carwin, MD      Brief Narrative:    Medical records reviewed and are as summarized below:  [name] is a 68 y.o. female [name] is a 68 year old female with pulmonary hypertension, hypertension, atrial fibrillation on Eliquis , history of DVT/PE with IVC, heart failure preserved ejection fraction, morbid obesity, CKD stage IV, chronic respiratory failure requiring 2 L nasal cannula at baseline, anxiety, insulin -dependent diabetes mellitus, who presents emergency department on 11/02/2023 for chief concerns of intentional overdose of OxyContin  on 11/01/2023.   08/01: admitted to behavioral health service on 11/06/2023 for the overdose attempt / SI. 08/06: hospitalist consulted for intractable N/V. Vitals at the time of hospitalist consultation showed temperature of 98.2, respiration rate 20, heart rate 50, blood pressure 163/60, SpO2 of 97% on 2 L nasal cannula. She also complained of constipation for about 2 weeks. Patient was also found to have weeping edema from the abdomen and weeping edema from left leg with bloody drainage soaking through left Unna boot. Admitted to hospitalist  08/07: palliative care consulted to clarify code status. DNR felt appropriate. Psychiatry also saw patient, no concerns on their end - continue Celexa  and Abilify , no psychiatric contraindications to discharge at this time  08/08: weeping edema from abdomen. Left Unna boot with visible oozing blood soaking through. Started IV Lasix  + albumin .  08/09-08/17: planning DC to SNF, placement pending and has been difficult to secure  08/18: psychiatry reevaluation - pt is cleared from their standpoint and no need for psychiatric admission.  08/19-08/22: placement pending, bed offer was withdrawn from planned facility  08/23: bedisde I&D by hospitalist for abscess R axilla 08/24: ok for placement [facility]  pending auth  08/25: pt reporting a bit more soreness today in R axilla - US  to evaluate in case gen surg needs to do more extensive I&D --> US  concerning for abscess, gen surg notified and Dr Desiderio will evaluate / possible to OR tomorrow if needed  08/26: to OR tomorrow for I&D, surgery to recheck tm. Foley out today, void trial. Anticipate dc tomorrow to facility         Assessment/Plan:   Principal Problem:   Intractable nausea and vomiting Active Problems:   Polyuria   Essential hypertension   Paroxysmal atrial fibrillation (HCC)   Type 2 diabetes mellitus with stage 3b chronic kidney disease, with long-term current use of insulin  (HCC)   (HFpEF) heart failure with preserved ejection fraction (HCC)   Morbid obesity with BMI of 50.0-59.9, adult (HCC)   LBBB (left bundle branch block)   Hypothyroidism   Pulmonary HTN, Moderate  PA pressure 09/2009   Thrombocytopenia (HCC)   Drug overdose, intentional, initial encounter (HCC)   CKD (chronic kidney disease) stage 4, GFR 15-29 ml/min (HCC)   Abscess of right axilla   Infection    Body mass index is 57.86 kg/m.  (Class III obesity)    Acute on Chronic HFpEF - improved  Recent echo 08/2023 noted EF 60-65%, moderate LVH, normal RV. Recent R/LHC noted no significant CAD, PA pressure 62/16, wedge of 11, PVR 4.4, group 3 pulmonary hypertension suspected Assoc w/ anasarca, ascites, peripheral edema Hold torsemide      AKI on CKD (chronic kidney disease) stage 4, GFR 15-29 ml/min Creatinine up from 2.33-2.64-3.27-3.38.  Suspect recent use of Bactrim  contributed to AKI.  She was also on torsemide . Consulted Dr.  Dennise, nephrologist, to assist with management because of worsening AKI. Baseline creatinine around 1.6-2  Torsemide  has been held since 12/02/2023. Nephrology - follow outpatient     R axillary abscess, mild, not resolving/worsening 08/25 Question abscess extension deeper versus enlarged lymph node I&D in OR  12/01/23  Had 7 doses of Bactrim .  Bactrim  discontinued on 12/02/2023. Wound culture showed MRSA that is resistant to Bactrim .  Patient will therefore be restarted on doxycycline  to complete 7 days of doxycycline     Chronic Lymphedema Venous stasis ulcers of left lower extremity  She had weeping edema (bloody drainage) from left leg. Continue Unna boots on bilateral lower extremities.Pt will most likely need these chronically as edema / skin weeping recurs with each removal     Intractable nausea and vomiting - resolved Suspect due to severe constipation, had not had BM in 2 weeks. She had a bowel movement on 11/29/2023.  UTI Laxatives and antiemetics as needed. Continue PPI Of note, iron  supplement potentially causing N/V, pt has done fine w/ switch to Niferex so will continue this     Type 2 diabetes mellitus with long-term current use of insulin  Continue Lantus  and NovoLog     Paroxysmal atrial fibrillation, bradycardia HR in the 50s not on rate control drugs Continue Eliquis     Hx UTI, Urine culture from 05/05/2023: Showed Enterobacter cloacae, pansensitive UA with only trace leukocytes, rare bacteria, 0-5 WBC's -- not consistent with UTI. Prior to admission patient was started on Keflex  so completed course for total 5 days.    Hyperkalemia Lokelma  given on 8/14.  Resolved Monitor BMP     Hypothyroid TSH 14.9 elevated, increased dose of Synthroid  from 75 to 100 mcg p.o. daily 8/15 Follow-up outpatient to repeat TSH level after 6 weeks.    Drug overdose, intentional, initial encounter  Major depressive disorder, anxiety Continue Celexa  and Abilify  Pt was initially admitted to medical floor from geropsych unit. One-on-one suicide precautions were lifted per psychiatry on 11/12/23. Per psychiatry, pt can be discharged when medically stable, no longer requires psych admission.    OHS/OSA Pulmonary HTN type III Doesn't tolerate CPAP Supplement O2 as needed Weight loss  encouraged      History of DVT and PE s/p IVC filter  Eliquis     Hypertension Low diastolic BP Continue amlodipine  Daily vital signs    Anemia of chronic disease Iron  profile, B12 within normal range Monitor H&H/CBC Of note, iron  supplement was causing N/V, pt has done fine w/ switch to Niferex so will continue this     Intertrigo, abdominal folds Lotrisone  cream twice daily followed by nystatin  powder Applied Lotrisone  cream on bilateral lower extremities well for possible dermatitis associated with chronic venous stasis/lymphedema Continue Lotrisone  for at least 2-4 weeks       Diet Order             Diet Carb Modified Fluid consistency: Thin  Diet effective now                                  Consultants: Psychiatrist Nephrologist Palliative care  Procedures: 08/23: bedside I&D R axillary abscess  08/26: deeper I&D and irrigation in OR R axillary abscess    Medications:    allopurinol   50 mg Oral QODAY   amLODipine   10 mg Oral Daily   ARIPiprazole   2 mg Oral QHS   Chlorhexidine  Gluconate Cloth  6 each Topical Daily   citalopram   40 mg Oral QHS   clotrimazole -betamethasone    Topical BID   doxycycline   100 mg Oral Q12H   insulin  aspart  0-15 Units Subcutaneous TID WC   insulin  aspart  0-5 Units Subcutaneous QHS   insulin  glargine  20 Units Subcutaneous Daily   iron  polysaccharides  150 mg Oral Daily   levothyroxine   100 mcg Oral QAC breakfast   melatonin  10 mg Oral QHS   multivitamin with minerals  1 tablet Oral Daily   nystatin    Topical BID   pantoprazole   40 mg Oral Daily   polyethylene glycol  17 g Oral Daily   rosuvastatin   10 mg Oral Daily   senna-docusate  1 tablet Oral BID   Continuous Infusions:  promethazine  (PHENERGAN ) injection (IM or IVPB) Stopped (12/01/23 1155)     Anti-infectives (From admission, onward)    Start     Dose/Rate Route Frequency Ordered Stop   12/04/23 0945  doxycycline  (VIBRA -TABS) tablet  100 mg        100 mg Oral Every 12 hours 12/04/23 0920 12/08/23 1959   12/02/23 0845  doxycycline  (VIBRA -TABS) tablet 100 mg        100 mg Oral Every 12 hours 12/02/23 0829 12/03/23 1104   11/28/23 2000  sulfamethoxazole -trimethoprim  (BACTRIM  DS) 800-160 MG per tablet 1 tablet  Status:  Discontinued        1 tablet Oral Every 12 hours 11/28/23 1448 12/02/23 0829              Family Communication/Anticipated D/C date and plan/Code Status   DVT prophylaxis: Place TED hose Start: 11/11/23 1406     Code Status: Limited: Do not attempt resuscitation (DNR) -DNR-LIMITED -Do Not Intubate/DNI   Family Communication: None Disposition Plan: Plan to discharge to SNF   Status is: Inpatient Remains inpatient appropriate because: Worsening AKI       Subjective:   No acute events overnight.  She has no complaints.  She is passing urine without any problems.  No shortness of breath or chest pain.  Objective:    Vitals:   12/04/23 0156 12/04/23 0436 12/04/23 0440 12/04/23 0849  BP: (!) 140/45 (!) 152/61  (!) 132/38  Pulse: (!) 50 (!) 49  (!) 50  Resp: (!) 24 16  19   Temp: 98.1 F (36.7 C) 98.7 F (37.1 C)  98.6 F (37 C)  TempSrc: Oral Oral    SpO2: 100% 100%  100%  Weight:   (!) 138.9 kg   Height:       No data found.   Intake/Output Summary (Last 24 hours) at 12/04/2023 1527 Last data filed at 12/04/2023 1300 Gross per 24 hour  Intake 1911.79 ml  Output --  Net 1911.79 ml   Filed Weights   12/02/23 0500 12/03/23 0500 12/04/23 0440  Weight: (!) 136.6 kg (!) 139.5 kg (!) 138.9 kg    Exam:  GEN: NAD SKIN: Warm and dry EYES: No pallor or icterus ENT: MMM CV: RRR PULM: CTA B ABD: soft, obese distended, NT, +BS CNS: AAO x 3, non focal EXT: Bilateral lower extremity edema/lymphedema        Data Reviewed:   I have personally reviewed following labs and imaging studies:  Labs: Labs show the following:   Basic Metabolic Panel: Recent Labs  Lab  11/29/23 0857 12/02/23 0545 12/03/23 0534 12/04/23 0434  NA 138 138 138 137  K 3.9 4.8 4.6 3.9  CL 96* 97* 98 98  CO2 32 32 31 30  GLUCOSE 122* 175* 111* 132*  BUN 86* 93* 98* 110*  CREATININE 2.64* 3.27* 3.38* 3.63*  CALCIUM  8.7* 9.2 9.1 8.5*   GFR Estimated Creatinine Clearance: 20 mL/min (A) (by C-G formula based on SCr of 3.63 mg/dL (H)). Liver Function Tests: Recent Labs  Lab 12/03/23 0534  AST 20  ALT 9  ALKPHOS 59  BILITOT 0.7  PROT 5.9*  ALBUMIN  3.3*   No results for input(s): LIPASE, AMYLASE in the last 168 hours. No results for input(s): AMMONIA in the last 168 hours. Coagulation profile No results for input(s): INR, PROTIME in the last 168 hours.  CBC: Recent Labs  Lab 11/29/23 0857 12/02/23 0545  WBC 7.6 7.3  HGB 8.0* 8.2*  HCT 26.8* 27.0*  MCV 103.1* 100.7*  PLT 127* 157   Cardiac Enzymes: No results for input(s): CKTOTAL, CKMB, CKMBINDEX, TROPONINI in the last 168 hours. BNP (last 3 results) No results for input(s): PROBNP in the last 8760 hours. CBG: Recent Labs  Lab 12/03/23 1628 12/03/23 2017 12/04/23 0153 12/04/23 0848 12/04/23 1136  GLUCAP 135* 171* 162* 174* 185*   D-Dimer: No results for input(s): DDIMER in the last 72 hours. Hgb A1c: No results for input(s): HGBA1C in the last 72 hours. Lipid Profile: No results for input(s): CHOL, HDL, LDLCALC, TRIG, CHOLHDL, LDLDIRECT in the last 72 hours. Thyroid  function studies: No results for input(s): TSH, T4TOTAL, T3FREE, THYROIDAB in the last 72 hours.  Invalid input(s): FREET3 Anemia work up: No results for input(s): VITAMINB12, FOLATE, FERRITIN, TIBC, IRON , RETICCTPCT in the last 72 hours. Sepsis Labs: Recent Labs  Lab 11/29/23 0857 12/02/23 0545  WBC 7.6 7.3    Microbiology Recent Results (from the past 240 hours)  Aerobic/Anaerobic Culture w Gram Stain (surgical/deep wound)     Status: None (Preliminary result)    Collection Time: 12/01/23  9:54 AM   Specimen: Wound; Tissue  Result Value Ref Range Status   Specimen Description   Final    WOUND Performed at [facility], [address] [place], [place] [address]    Special Requests   Final    RIGHT AXILLA Performed at [facility], [address] [place], [place] [address]    Gram Stain   Final    FEW WBC PRESENT, PREDOMINANTLY PMN NO ORGANISMS SEEN Performed at [facility], [address], [place], [place] [address]    Culture   Final    FEW METHICILLIN RESISTANT STAPHYLOCOCCUS AUREUS FEW DIPHTHEROIDS(CORYNEBACTERIUM SPECIES) Standardized susceptibility testing for this organism is not available. NO ANAEROBES ISOLATED; CULTURE IN PROGRESS FOR 5 DAYS    Report Status PENDING  Incomplete   Organism ID, Bacteria METHICILLIN RESISTANT STAPHYLOCOCCUS AUREUS  Final      Susceptibility   Methicillin resistant staphylococcus aureus - MIC*    CIPROFLOXACIN  >=8 RESISTANT Resistant     ERYTHROMYCIN <=0.25 SENSITIVE Sensitive     GENTAMICIN  <=0.5 SENSITIVE Sensitive     OXACILLIN >=4 RESISTANT Resistant     TETRACYCLINE <=1 SENSITIVE Sensitive     VANCOMYCIN  1 SENSITIVE Sensitive     TRIMETH /SULFA  >=320 RESISTANT Resistant     CLINDAMYCIN <=0.25 SENSITIVE Sensitive     RIFAMPIN <=0.5 SENSITIVE Sensitive     Inducible Clindamycin NEGATIVE Sensitive     LINEZOLID 2 SENSITIVE Sensitive     * FEW METHICILLIN RESISTANT STAPHYLOCOCCUS AUREUS  Aerobic/Anaerobic Culture w Gram Stain (surgical/deep wound)     Status: None (Preliminary result)   Collection Time: 12/01/23 10:08 AM  Specimen: Wound; Abscess  Result Value Ref Range Status   Specimen Description   Final    WOUND Performed at [facility], [address], [place], [place] [address]    Special Requests   Final    RIGHT AXILLA Performed at [facility], [address] [place], [place] [address]    Gram Stain   Final    FEW WBC PRESENT,  PREDOMINANTLY PMN FEW GRAM POSITIVE COCCI Performed at [facility], [address], [place], [place] [address]    Culture   Final    ABUNDANT METHICILLIN RESISTANT STAPHYLOCOCCUS AUREUS NO ANAEROBES ISOLATED; CULTURE IN PROGRESS FOR 5 DAYS    Report Status PENDING  Incomplete   Organism ID, Bacteria METHICILLIN RESISTANT STAPHYLOCOCCUS AUREUS  Final      Susceptibility   Methicillin resistant staphylococcus aureus - MIC*    CIPROFLOXACIN  >=8 RESISTANT Resistant     ERYTHROMYCIN <=0.25 SENSITIVE Sensitive     GENTAMICIN  <=0.5 SENSITIVE Sensitive     OXACILLIN >=4 RESISTANT Resistant     TETRACYCLINE <=1 SENSITIVE Sensitive     VANCOMYCIN  1 SENSITIVE Sensitive     TRIMETH /SULFA  >=320 RESISTANT Resistant     CLINDAMYCIN <=0.25 SENSITIVE Sensitive     RIFAMPIN <=0.5 SENSITIVE Sensitive     Inducible Clindamycin NEGATIVE Sensitive     LINEZOLID 2 SENSITIVE Sensitive     * ABUNDANT METHICILLIN RESISTANT STAPHYLOCOCCUS AUREUS    Procedures and diagnostic studies:  No results found.              LOS: 21 days   Lache Dagher  [employer] loss adjuster on [email or web address]. If 7PM-7AM, please contact night-coverage at [email or web address]     12/04/2023, 3:27 PM

## 2023-12-05 ENCOUNTER — Other Ambulatory Visit (HOSPITAL_COMMUNITY): Payer: Self-pay | Admitting: Family Medicine

## 2023-12-05 LAB — RENAL FUNCTION PANEL
Albumin: 2.9 g/dL — ABNORMAL LOW (ref 3.5–5.0)
Anion gap: 10 (ref 5–15)
BUN: 98 mg/dL — ABNORMAL HIGH (ref 8–23)
CO2: 30 mmol/L (ref 22–32)
Calcium: 8.6 mg/dL — ABNORMAL LOW (ref 8.9–10.3)
Chloride: 98 mmol/L (ref 98–111)
Creatinine, Ser: 3.15 mg/dL — ABNORMAL HIGH (ref 0.44–1.00)
GFR, Estimated: 16 mL/min — ABNORMAL LOW (ref >60)
Glucose, Bld: 131 mg/dL — ABNORMAL HIGH (ref 70–99)
Phosphorus: 3.7 mg/dL (ref 2.5–4.6)
Potassium: 3.8 mmol/L (ref 3.5–5.1)
Sodium: 138 mmol/L (ref 135–145)

## 2023-12-05 LAB — GLUCOSE, CAPILLARY
Glucose-Capillary: 106 mg/dL — ABNORMAL HIGH (ref 70–99)
Glucose-Capillary: 138 mg/dL — ABNORMAL HIGH (ref 70–99)
Glucose-Capillary: 197 mg/dL — ABNORMAL HIGH (ref 70–99)
Glucose-Capillary: 170 mg/dL — ABNORMAL HIGH (ref 70–99)

## 2023-12-05 NOTE — Progress Notes (Signed)
 [facility]  PROGRESS NOTE   Subjective:   We have reconsulted due to worsening renal function. At time of sign off, patient renal function had remained at baseline for several days during diuresis. Patient was transitioned onto oral diuretic once we signed off.   Patient's creatinine has risen to  3.63 today. Appears patient was started on Bactrim  on 8/24 for abscess. She underwent I&D of abscess on 8/26 with general anesthesia. Torsemide  and Bactrim  were held on Wednesday due to increasing creatinine.   Update: Patient seen sitting up in bed Denies pain or discomfort Tolerating small meals  Creatinine 3.15   Objective:  Vital signs: Blood pressure (!) 141/52, pulse (!) 50, temperature 98.3 F (36.8 C), temperature source Oral, resp. rate 18, height 5' 1 (1.549 m), weight (!) 137.2 kg, SpO2 98%.  Intake/Output Summary (Last 24 hours) at 12/05/2023 1011 Last data filed at 12/04/2023 1900 Gross per 24 hour  Intake 842.28 ml  Output --  Net 842.28 ml   Filed Weights   12/03/23 0500 12/04/23 0440 12/05/23 0500  Weight: (!) 139.5 kg (!) 138.9 kg (!) 137.2 kg     Physical Exam: General:  No acute distress  Head:  Normocephalic, atraumatic. Moist oral mucosal membranes  Eyes:  Anicteric  Lungs:   Clear to auscultation, normal effort, 2L [place]  Heart:  S1S2 no rubs  Abdomen:   Soft, nontender, bowel sounds present  Extremities: 1+ peripheral edema.  Neurologic:  Awake, alert, following commands  Skin:  No lesions  Access: None    Basic Metabolic Panel: Recent Labs  Lab 11/29/23 0857 12/02/23 0545 12/03/23 0534 12/04/23 0434 12/05/23 0540  NA 138 138 138 137 138  K 3.9 4.8 4.6 3.9 3.8  CL 96* 97* 98 98 98  CO2 32 32 31 30 30   GLUCOSE 122* 175* 111* 132* 131*  BUN 86* 93* 98* 110* 98*  CREATININE 2.64* 3.27* 3.38* 3.63* 3.15*  CALCIUM  8.7* 9.2 9.1 8.5* 8.6*  PHOS  --   --   --   --  3.7   GFR: Estimated Creatinine Clearance: 22.9 mL/min (A) (by C-G  formula based on SCr of 3.15 mg/dL (H)).  Liver Function Tests: Recent Labs  Lab 12/03/23 0534 12/05/23 0540  AST 20  --   ALT 9  --   ALKPHOS 59  --   BILITOT 0.7  --   PROT 5.9*  --   ALBUMIN  3.3* 2.9*   No results for input(s): LIPASE, AMYLASE in the last 168 hours. No results for input(s): AMMONIA in the last 168 hours.  CBC: Recent Labs  Lab 11/29/23 0857 12/02/23 0545  WBC 7.6 7.3  HGB 8.0* 8.2*  HCT 26.8* 27.0*  MCV 103.1* 100.7*  PLT 127* 157     HbA1C: Hgb A1c MFr Bld  Date/Time Value Ref Range Status  09/04/2023 04:06 AM 5.7 (H) 4.8 - 5.6 % Final    Comment:    (NOTE) Diagnosis of Diabetes The following HbA1c ranges recommended by the American Diabetes Association (ADA) may be used as an aid in the diagnosis of diabetes mellitus.  Hemoglobin             Suggested A1C NGSP%              Diagnosis  <5.7                   Non Diabetic  5.7-6.4  Pre-Diabetic  >6.4                   Diabetic  <7.0                   Glycemic control for                       adults with diabetes.    09/09/2021 04:35 AM 6.0 (H) 4.8 - 5.6 % Final    Comment:    (NOTE) Pre diabetes:          5.7%-6.4%  Diabetes:              >6.4%  Glycemic control for   <7.0% adults with diabetes     Urinalysis: No results for input(s): COLORURINE, LABSPEC, PHURINE, GLUCOSEU, HGBUR, BILIRUBINUR, KETONESUR, PROTEINUR, UROBILINOGEN, NITRITE, LEUKOCYTESUR in the last 72 hours.  Invalid input(s): APPERANCEUR    Imaging: No results found.    Medications:    promethazine  (PHENERGAN ) injection (IM or IVPB) Stopped (12/01/23 1155)    allopurinol   50 mg Oral QODAY   amLODipine   10 mg Oral Daily   ARIPiprazole   2 mg Oral QHS   Chlorhexidine  Gluconate Cloth  6 each Topical Daily   citalopram   40 mg Oral QHS   clotrimazole -betamethasone    Topical BID   doxycycline   100 mg Oral Q12H   insulin  aspart  0-15 Units Subcutaneous TID WC    insulin  aspart  0-5 Units Subcutaneous QHS   insulin  glargine  20 Units Subcutaneous Daily   iron  polysaccharides  150 mg Oral Daily   levothyroxine   100 mcg Oral QAC breakfast   melatonin  10 mg Oral QHS   multivitamin with minerals  1 tablet Oral Daily   nystatin    Topical BID   pantoprazole   40 mg Oral Daily   polyethylene glycol  17 g Oral Daily   rosuvastatin   10 mg Oral Daily   senna-docusate  1 tablet Oral BID    Assessment/ Plan:     68 y.o. female with a PMHx of hypertension, coronary artery disease, atrial fibrillation, congestive heart failure, DVT/PE, history of IVC placement, morbid obesity, diabetes, peripheral vascular disease, chronic kidney disease admitted with history of intentional overdose of OxyContin .  She has been on diuretics.  Now she is found to have worsening renal insufficiency and so renal evaluation is requested.  She has been on torsemide  at 40 mg twice a day.  She has been fluid and dietary noncompliant     Intractable vomiting [R11.10] Dehydration [E86.0] Intractable nausea and vomiting [R11.2]      #1: Acute kidney injury on chronic kidney disease stage IV: Acute kidney injury appears multifactorial, ATN from Bactrim  and diuresis. Patient has chronic kidney disease most likely secondary to diabetic kidney disease complicated by decreased cardiac output during the ischemic renal disease.  Renal ultrasound showed 2.2 cm renal mass, was awaiting outpatient urology follow up.   Creatinine has shown improvement today. Bactrim  discontinued. IVF stopped. Will continue to monitor.     #2: Hypertension with chronic kidney disease. Continue amlodipine  at 10 mg daily. Torsemide  held    #3: Diabetes mellitus type II with chronic kidney disease/renal manifestations: insulin  dependent. Home regimen includes Novolog  and Lantus . Most recent hemoglobin A1c is 5.7 on 09/04/23.      Glucose well controlled   #4: Renal mass: Patient is scheduled for CT scan and is  scheduled to follow-up with urology.   #5:  Anemia with chronic kidney disease: Continue iron  supplementation. Hgb 8.2 at last check    LOS: 22 [facility] 8/30/202510:11 AM

## 2023-12-05 NOTE — Progress Notes (Signed)
 Mobility Specialist - Progress Note   12/05/23 1506  Mobility  Activity Refused and notified nurse if applicable   Pt denied amb/mobility. Pt expressed she has reached her mobilizing limit this date as she has been up to the bathroom, stood to brush teeth and completed bed level exercise in her free time.  America Silvan Mobility Specialist 12/05/23 3:08 PM

## 2023-12-05 NOTE — Progress Notes (Signed)
 Progress Note    [name]  FMW:[id number] DOB: [birth year]  DOA: 11/11/2023 PCP: Valma Carwin, MD      Brief Narrative:    Medical records reviewed and are as summarized below:  [name] is a 68 y.o. female [name] is a 68 year old female with pulmonary hypertension, hypertension, atrial fibrillation on Eliquis , history of DVT/PE with IVC, heart failure preserved ejection fraction, morbid obesity, CKD stage IV, chronic respiratory failure requiring 2 L nasal cannula at baseline, anxiety, insulin -dependent diabetes mellitus, who presents emergency department on 11/02/2023 for chief concerns of intentional overdose of OxyContin  on 11/01/2023.   08/01: admitted to behavioral health service on 11/06/2023 for the overdose attempt / SI. 08/06: hospitalist consulted for intractable N/V. Vitals at the time of hospitalist consultation showed temperature of 98.2, respiration rate 20, heart rate 50, blood pressure 163/60, SpO2 of 97% on 2 L nasal cannula. She also complained of constipation for about 2 weeks. Patient was also found to have weeping edema from the abdomen and weeping edema from left leg with bloody drainage soaking through left Unna boot. Admitted to hospitalist  08/07: palliative care consulted to clarify code status. DNR felt appropriate. Psychiatry also saw patient, no concerns on their end - continue Celexa  and Abilify , no psychiatric contraindications to discharge at this time  08/08: weeping edema from abdomen. Left Unna boot with visible oozing blood soaking through. Started IV Lasix  + albumin .  08/09-08/17: planning DC to SNF, placement pending and has been difficult to secure  08/18: psychiatry reevaluation - pt is cleared from their standpoint and no need for psychiatric admission.  08/19-08/22: placement pending, bed offer was withdrawn from planned facility  08/23: bedisde I&D by hospitalist for abscess R axilla 08/24: ok for placement [facility]  pending auth  08/25: pt reporting a bit more soreness today in R axilla - US  to evaluate in case gen surg needs to do more extensive I&D --> US  concerning for abscess, gen surg notified and Dr Desiderio will evaluate / possible to OR tomorrow if needed  08/26: to OR tomorrow for I&D, surgery to recheck tm. Foley out today, void trial. Anticipate dc tomorrow to facility         Assessment/Plan:   Principal Problem:   Intractable nausea and vomiting Active Problems:   Polyuria   Essential hypertension   Paroxysmal atrial fibrillation (HCC)   Type 2 diabetes mellitus with stage 3b chronic kidney disease, with long-term current use of insulin  (HCC)   (HFpEF) heart failure with preserved ejection fraction (HCC)   Morbid obesity with BMI of 50.0-59.9, adult (HCC)   LBBB (left bundle branch block)   Hypothyroidism   Pulmonary HTN, Moderate  PA pressure 09/2009   Thrombocytopenia (HCC)   Drug overdose, intentional, initial encounter (HCC)   CKD (chronic kidney disease) stage 4, GFR 15-29 ml/min (HCC)   Abscess of right axilla   Infection    Body mass index is 57.15 kg/m.  (Class III obesity)    Acute on Chronic HFpEF - improved  Recent echo 08/2023 noted EF 60-65%, moderate LVH, normal RV. Recent R/LHC noted no significant CAD, PA pressure 62/16, wedge of 11, PVR 4.4, group 3 pulmonary hypertension suspected Assoc w/ anasarca, ascites, peripheral edema Hold torsemide      AKI on CKD (chronic kidney disease) stage 4, GFR 15-29 ml/min Creatinine is starting to trend downward.  Creatinine down from 3.63-3.15.  Creatinine had gone up from 2.33-2.64-3.27-3.38.  Suspect recent  use of Bactrim  contributed to AKI.  She was also on torsemide . Needs a BMP.  Follow-up with nephrologist. Baseline creatinine around 1.6-2  Torsemide  has been held since 12/02/2023. Nephrology - follow outpatient     R axillary abscess, mild, not resolving/worsening 08/25 Question abscess extension deeper  versus enlarged lymph node I&D in OR 12/01/23  Had 7 doses of Bactrim .  Bactrim  discontinued on 12/02/2023. Wound culture showed MRSA that is resistant to Bactrim .  Continue doxycycline  through 12/08/2023   Chronic Lymphedema Venous stasis ulcers of left lower extremity  She had weeping edema (bloody drainage) from left leg. Continue Unna boots on bilateral lower extremities.Pt will most likely need these chronically as edema / skin weeping recurs with each removal     Intractable nausea and vomiting - resolved Suspect due to severe constipation, had not had BM in 2 weeks. She had a bowel movement on 11/29/2023.  UTI Laxatives and antiemetics as needed. Continue PPI Of note, iron  supplement potentially causing N/V, pt has done fine w/ switch to Niferex     Type 2 diabetes mellitus with long-term current use of insulin  Continue Lantus  and NovoLog     Paroxysmal atrial fibrillation, bradycardia HR in the 50s not on rate control drugs Continue Eliquis     Hx UTI, Urine culture from 05/05/2023: Showed Enterobacter cloacae, pansensitive UA with only trace leukocytes, rare bacteria, 0-5 WBC's -- not consistent with UTI. Prior to admission patient was started on Keflex  so completed course for total 5 days.    Hyperkalemia Lokelma  given on 8/14.  Resolved Monitor BMP     Hypothyroid TSH 14.9 elevated, increased dose of Synthroid  from 75 to 100 mcg p.o. daily 8/15 Follow-up outpatient to repeat TSH level after 6 weeks.    Drug overdose, intentional, initial encounter  Major depressive disorder, anxiety Continue Celexa  and Abilify  Pt was initially admitted to medical floor from geropsych unit. One-on-one suicide precautions were lifted per psychiatry on 11/12/23. Per psychiatry, pt can be discharged when medically stable, no longer requires psych admission.    OHS/OSA Pulmonary HTN type III Doesn't tolerate CPAP Supplement O2 as needed Weight loss encouraged      History of  DVT and PE s/p IVC filter  Eliquis     Hypertension Low diastolic BP Continue amlodipine  Daily vital signs    Anemia of chronic disease Iron  profile, B12 within normal range Monitor H&H/CBC Of note, iron  supplement was causing N/V, pt has done fine w/ switch to Niferex so will continue this     Intertrigo, abdominal folds Lotrisone  cream twice daily followed by nystatin  powder Applied Lotrisone  cream on bilateral lower extremities well for possible dermatitis associated with chronic venous stasis/lymphedema Continue Lotrisone  for at least 2-4 weeks       Diet Order             Diet Carb Modified Fluid consistency: Thin  Diet effective now                                  Consultants: Psychiatrist Nephrologist Palliative care  Procedures: 08/23: bedside I&D R axillary abscess  08/26: deeper I&D and irrigation in OR R axillary abscess    Medications:    allopurinol   50 mg Oral QODAY   amLODipine   10 mg Oral Daily   ARIPiprazole   2 mg Oral QHS   Chlorhexidine  Gluconate Cloth  6 each Topical Daily   citalopram   40 mg Oral QHS  clotrimazole -betamethasone    Topical BID   doxycycline   100 mg Oral Q12H   insulin  aspart  0-15 Units Subcutaneous TID WC   insulin  aspart  0-5 Units Subcutaneous QHS   insulin  glargine  20 Units Subcutaneous Daily   iron  polysaccharides  150 mg Oral Daily   levothyroxine   100 mcg Oral QAC breakfast   melatonin  10 mg Oral QHS   multivitamin with minerals  1 tablet Oral Daily   nystatin    Topical BID   pantoprazole   40 mg Oral Daily   polyethylene glycol  17 g Oral Daily   rosuvastatin   10 mg Oral Daily   senna-docusate  1 tablet Oral BID   Continuous Infusions:  promethazine  (PHENERGAN ) injection (IM or IVPB) Stopped (12/01/23 1155)     Anti-infectives (From admission, onward)    Start     Dose/Rate Route Frequency Ordered Stop   12/04/23 0945  doxycycline  (VIBRA -TABS) tablet 100 mg        100 mg Oral  Every 12 hours 12/04/23 0920 12/08/23 1959   12/02/23 0845  doxycycline  (VIBRA -TABS) tablet 100 mg        100 mg Oral Every 12 hours 12/02/23 0829 12/03/23 1104   11/28/23 2000  sulfamethoxazole -trimethoprim  (BACTRIM  DS) 800-160 MG per tablet 1 tablet  Status:  Discontinued        1 tablet Oral Every 12 hours 11/28/23 1448 12/02/23 0829              Family Communication/Anticipated D/C date and plan/Code Status   DVT prophylaxis: Place TED hose Start: 11/11/23 1406     Code Status: Limited: Do not attempt resuscitation (DNR) -DNR-LIMITED -Do Not Intubate/DNI   Family Communication: None Disposition Plan: Plan to discharge to SNF   Status is: Inpatient Remains inpatient appropriate because: Worsening AKI       Subjective:   Interval events noted.  No complaints.  No shortness of breath or chest pain.  Objective:    Vitals:   12/04/23 0849 12/05/23 0500 12/05/23 0521 12/05/23 0734  BP: (!) 132/38  (!) 142/50 (!) 141/52  Pulse: (!) 50  (!) 50 (!) 50  Resp: 19  18 18   Temp: 98.6 F (37 C)  98.2 F (36.8 C) 98.3 F (36.8 C)  TempSrc:    Oral  SpO2: 100%  98% 98%  Weight:  (!) 137.2 kg    Height:       No data found.   Intake/Output Summary (Last 24 hours) at 12/05/2023 1425 Last data filed at 12/05/2023 1300 Gross per 24 hour  Intake 1122.28 ml  Output --  Net 1122.28 ml   Filed Weights   12/03/23 0500 12/04/23 0440 12/05/23 0500  Weight: (!) 139.5 kg (!) 138.9 kg (!) 137.2 kg    Exam:  GEN: NAD SKIN: Warm and dry.  Wound in right axilla looks good EYES: No pallor or icterus ENT: MMM CV: RRR PULM: CTA B ABD: soft, obese/distended, NT, +BS CNS: AAO x 3, non focal EXT: Bilateral lower extremity chronic lymphedema     Data Reviewed:   I have personally reviewed following labs and imaging studies:  Labs: Labs show the following:   Basic Metabolic Panel: Recent Labs  Lab 11/29/23 0857 12/02/23 0545 12/03/23 0534 12/04/23 0434  12/05/23 0540  NA 138 138 138 137 138  K 3.9 4.8 4.6 3.9 3.8  CL 96* 97* 98 98 98  CO2 32 32 31 30 30   GLUCOSE 122* 175* 111* 132* 131*  BUN 86* 93* 98* 110* 98*  CREATININE 2.64* 3.27* 3.38* 3.63* 3.15*  CALCIUM  8.7* 9.2 9.1 8.5* 8.6*  PHOS  --   --   --   --  3.7   GFR Estimated Creatinine Clearance: 22.9 mL/min (A) (by C-G formula based on SCr of 3.15 mg/dL (H)). Liver Function Tests: Recent Labs  Lab 12/03/23 0534 12/05/23 0540  AST 20  --   ALT 9  --   ALKPHOS 59  --   BILITOT 0.7  --   PROT 5.9*  --   ALBUMIN  3.3* 2.9*   No results for input(s): LIPASE, AMYLASE in the last 168 hours. No results for input(s): AMMONIA in the last 168 hours. Coagulation profile No results for input(s): INR, PROTIME in the last 168 hours.  CBC: Recent Labs  Lab 11/29/23 0857 12/02/23 0545  WBC 7.6 7.3  HGB 8.0* 8.2*  HCT 26.8* 27.0*  MCV 103.1* 100.7*  PLT 127* 157   Cardiac Enzymes: No results for input(s): CKTOTAL, CKMB, CKMBINDEX, TROPONINI in the last 168 hours. BNP (last 3 results) No results for input(s): PROBNP in the last 8760 hours. CBG: Recent Labs  Lab 12/04/23 1136 12/04/23 1648 12/04/23 2004 12/05/23 0731 12/05/23 1224  GLUCAP 185* 180* 159* 106* 138*   D-Dimer: No results for input(s): DDIMER in the last 72 hours. Hgb A1c: No results for input(s): HGBA1C in the last 72 hours. Lipid Profile: No results for input(s): CHOL, HDL, LDLCALC, TRIG, CHOLHDL, LDLDIRECT in the last 72 hours. Thyroid  function studies: No results for input(s): TSH, T4TOTAL, T3FREE, THYROIDAB in the last 72 hours.  Invalid input(s): FREET3 Anemia work up: No results for input(s): VITAMINB12, FOLATE, FERRITIN, TIBC, IRON , RETICCTPCT in the last 72 hours. Sepsis Labs: Recent Labs  Lab 11/29/23 0857 12/02/23 0545  WBC 7.6 7.3    Microbiology Recent Results (from the past 240 hours)  Aerobic/Anaerobic Culture w Gram  Stain (surgical/deep wound)     Status: None (Preliminary result)   Collection Time: 12/01/23  9:54 AM   Specimen: Wound; Tissue  Result Value Ref Range Status   Specimen Description   Final    WOUND Performed at [facility], [address] [place], [place] [address]    Special Requests   Final    RIGHT AXILLA Performed at [facility], [address] [place], [place] [address]    Gram Stain   Final    FEW WBC PRESENT, PREDOMINANTLY PMN NO ORGANISMS SEEN Performed at [facility], [address] [place], [place] [address]    Culture   Final    FEW METHICILLIN RESISTANT STAPHYLOCOCCUS AUREUS FEW DIPHTHEROIDS(CORYNEBACTERIUM SPECIES) Standardized susceptibility testing for this organism is not available. NO ANAEROBES ISOLATED; CULTURE IN PROGRESS FOR 5 DAYS    Report Status PENDING  Incomplete   Organism ID, Bacteria METHICILLIN RESISTANT STAPHYLOCOCCUS AUREUS  Final      Susceptibility   Methicillin resistant staphylococcus aureus - MIC*    CIPROFLOXACIN  >=8 RESISTANT Resistant     ERYTHROMYCIN <=0.25 SENSITIVE Sensitive     GENTAMICIN  <=0.5 SENSITIVE Sensitive     OXACILLIN >=4 RESISTANT Resistant     TETRACYCLINE <=1 SENSITIVE Sensitive     VANCOMYCIN  1 SENSITIVE Sensitive     TRIMETH /SULFA  >=320 RESISTANT Resistant     CLINDAMYCIN <=0.25 SENSITIVE Sensitive     RIFAMPIN <=0.5 SENSITIVE Sensitive     Inducible Clindamycin NEGATIVE Sensitive     LINEZOLID 2 SENSITIVE Sensitive     *  FEW METHICILLIN RESISTANT STAPHYLOCOCCUS AUREUS  Aerobic/Anaerobic Culture w Gram Stain (surgical/deep wound)     Status: None (Preliminary result)   Collection Time: 12/01/23 10:08 AM   Specimen: Wound; Abscess  Result Value Ref Range Status   Specimen Description   Final    WOUND Performed at [facility], [address] [place], [place] [address]    Special Requests   Final    RIGHT AXILLA Performed at [facility], [address] [place], [place] [address]    Gram Stain   Final    FEW WBC PRESENT, PREDOMINANTLY PMN FEW GRAM POSITIVE COCCI Performed at [facility], [address], [place], [place] [address]    Culture   Final    ABUNDANT METHICILLIN RESISTANT STAPHYLOCOCCUS AUREUS NO ANAEROBES ISOLATED; CULTURE IN PROGRESS FOR 5 DAYS    Report Status PENDING  Incomplete   Organism ID, Bacteria METHICILLIN RESISTANT STAPHYLOCOCCUS AUREUS  Final      Susceptibility   Methicillin resistant staphylococcus aureus - MIC*    CIPROFLOXACIN  >=8 RESISTANT Resistant     ERYTHROMYCIN <=0.25 SENSITIVE Sensitive     GENTAMICIN  <=0.5 SENSITIVE Sensitive     OXACILLIN >=4 RESISTANT Resistant     TETRACYCLINE <=1 SENSITIVE Sensitive     VANCOMYCIN  1 SENSITIVE Sensitive     TRIMETH /SULFA  >=320 RESISTANT Resistant     CLINDAMYCIN <=0.25 SENSITIVE Sensitive     RIFAMPIN <=0.5 SENSITIVE Sensitive     Inducible Clindamycin NEGATIVE Sensitive     LINEZOLID 2 SENSITIVE Sensitive     * ABUNDANT METHICILLIN RESISTANT STAPHYLOCOCCUS AUREUS    Procedures and diagnostic studies:  No results found.              LOS: 22 days   Jhase Creppel  [employer] loss adjuster on [email or web address]. If 7PM-7AM, please contact night-coverage at [email or web address]     12/05/2023, 2:25 PM

## 2023-12-06 LAB — AEROBIC/ANAEROBIC CULTURE W GRAM STAIN (SURGICAL/DEEP WOUND)

## 2023-12-06 LAB — RENAL FUNCTION PANEL
Albumin: 3.1 g/dL — ABNORMAL LOW (ref 3.5–5.0)
Anion gap: 10 (ref 5–15)
BUN: 108 mg/dL — ABNORMAL HIGH (ref 8–23)
CO2: 30 mmol/L (ref 22–32)
Calcium: 8.8 mg/dL — ABNORMAL LOW (ref 8.9–10.3)
Chloride: 100 mmol/L (ref 98–111)
Creatinine, Ser: 3.19 mg/dL — ABNORMAL HIGH (ref 0.44–1.00)
GFR, Estimated: 15 mL/min — ABNORMAL LOW (ref >60)
Glucose, Bld: 145 mg/dL — ABNORMAL HIGH (ref 70–99)
Phosphorus: 3.9 mg/dL (ref 2.5–4.6)
Potassium: 4.4 mmol/L (ref 3.5–5.1)
Sodium: 140 mmol/L (ref 135–145)

## 2023-12-06 LAB — GLUCOSE, CAPILLARY
Glucose-Capillary: 124 mg/dL — ABNORMAL HIGH (ref 70–99)
Glucose-Capillary: 144 mg/dL — ABNORMAL HIGH (ref 70–99)
Glucose-Capillary: 187 mg/dL — ABNORMAL HIGH (ref 70–99)

## 2023-12-06 MED ORDER — ARIPIPRAZOLE 2 MG PO TABS: 2.0000 mg | ORAL_TABLET | Freq: Every day | ORAL | Status: DC

## 2023-12-06 MED ORDER — DIAZEPAM 5 MG PO TABS: 5.0000 mg | ORAL_TABLET | Freq: Two times a day (BID) | ORAL | 0 refills | Status: DC | PRN

## 2023-12-06 MED ORDER — POLYSACCHARIDE IRON COMPLEX 150 MG PO CAPS: 150.0000 mg | ORAL_CAPSULE | Freq: Every day | ORAL | Status: DC

## 2023-12-06 MED ORDER — INSULIN ASPART 100 UNIT/ML ~~LOC~~ SOLN: 5.0000 [IU] | Freq: Three times a day (TID) | SUBCUTANEOUS | Status: DC

## 2023-12-06 MED ORDER — DOXYCYCLINE HYCLATE 100 MG PO TABS: 100.0000 mg | ORAL_TABLET | Freq: Two times a day (BID) | ORAL | Status: AC

## 2023-12-06 MED ORDER — CLOTRIMAZOLE-BETAMETHASONE 1-0.05 % EX CREA: TOPICAL_CREAM | Freq: Two times a day (BID) | CUTANEOUS | Status: AC

## 2023-12-06 MED ORDER — LEVOTHYROXINE SODIUM 100 MCG PO TABS: 100.0000 ug | ORAL_TABLET | Freq: Every day | ORAL | Status: DC

## 2023-12-06 MED ORDER — NYSTATIN 100000 UNIT/GM EX POWD: Freq: Two times a day (BID) | CUTANEOUS | Status: AC

## 2023-12-06 NOTE — Progress Notes (Signed)
 Questioned pt if she had a family member or friend that would be able to provide transportation from the hospital to [facility]).  Patient verbalized that her brother is in his 18's and her friends live out of town, advised TOC of said conversation.

## 2023-12-06 NOTE — Progress Notes (Signed)
 Attempted to call  facility four different  times to give a pt report,  but nobody answered it.

## 2023-12-06 NOTE — Progress Notes (Signed)
 MD order received in [facility] to discharge pt to SNF today; TOC previously established discharge to [facility]); discharge packet prepared for [facility] to take to the facility; [facility] present for pt discharge; discharge packet given to [facility] personnel to take to the facility; pt discharged via stretcher

## 2023-12-06 NOTE — Progress Notes (Signed)
 [facility]  PROGRESS NOTE   Subjective:   We have reconsulted due to worsening renal function. At time of sign off, patient renal function had remained at baseline for several days during diuresis. Patient was transitioned onto oral diuretic once we signed off.   Patient's creatinine has risen to  3.63 today. Appears patient was started on Bactrim  on 8/24 for abscess. She underwent I&D of abscess on 8/26 with general anesthesia. Torsemide  and Bactrim  were held on Wednesday due to increasing creatinine.   Update: Patient receiving ADL assistance Remains on [place], but able to lay flat  Creatinine 3.19   Objective:  Vital signs: Blood pressure (!) 135/56, pulse (!) 50, temperature 98.3 F (36.8 C), resp. rate 16, height 5' 1 (1.549 m), weight (!) 137.2 kg, SpO2 100%.  Intake/Output Summary (Last 24 hours) at 12/06/2023 0947 Last data filed at 12/05/2023 1900 Gross per 24 hour  Intake 480 ml  Output --  Net 480 ml   Filed Weights   12/03/23 0500 12/04/23 0440 12/05/23 0500  Weight: (!) 139.5 kg (!) 138.9 kg (!) 137.2 kg     Physical Exam: General:  No acute distress  Head:  Normocephalic, atraumatic.  Eyes:  Anicteric  Lungs:   normal effort, 2L   Heart:  S1S2 no rubs  Abdomen:   Soft, nontender, obese  Extremities: 1+ peripheral edema.  Neurologic:  Awake, alert, following commands  Skin:  No lesions  Access: None    Basic Metabolic Panel: Recent Labs  Lab 12/02/23 0545 12/03/23 0534 12/04/23 0434 12/05/23 0540 12/06/23 0550  NA 138 138 137 138 140  K 4.8 4.6 3.9 3.8 4.4  CL 97* 98 98 98 100  CO2 32 31 30 30 30   GLUCOSE 175* 111* 132* 131* 145*  BUN 93* 98* 110* 98* 108*  CREATININE 3.27* 3.38* 3.63* 3.15* 3.19*  CALCIUM  9.2 9.1 8.5* 8.6* 8.8*  PHOS  --   --   --  3.7 3.9   GFR: Estimated Creatinine Clearance: 22.6 mL/min (A) (by C-G formula based on SCr of 3.19 mg/dL (H)).  Liver Function Tests: Recent Labs  Lab 12/03/23 0534  12/05/23 0540 12/06/23 0550  AST 20  --   --   ALT 9  --   --   ALKPHOS 59  --   --   BILITOT 0.7  --   --   PROT 5.9*  --   --   ALBUMIN  3.3* 2.9* 3.1*   No results for input(s): LIPASE, AMYLASE in the last 168 hours. No results for input(s): AMMONIA in the last 168 hours.  CBC: Recent Labs  Lab 12/02/23 0545  WBC 7.3  HGB 8.2*  HCT 27.0*  MCV 100.7*  PLT 157     HbA1C: Hgb A1c MFr Bld  Date/Time Value Ref Range Status  09/04/2023 04:06 AM 5.7 (H) 4.8 - 5.6 % Final    Comment:    (NOTE) Diagnosis of Diabetes The following HbA1c ranges recommended by the American Diabetes Association (ADA) may be used as an aid in the diagnosis of diabetes mellitus.  Hemoglobin             Suggested A1C NGSP%              Diagnosis  <5.7                   Non Diabetic  5.7-6.4  Pre-Diabetic  >6.4                   Diabetic  <7.0                   Glycemic control for                       adults with diabetes.    09/09/2021 04:35 AM 6.0 (H) 4.8 - 5.6 % Final    Comment:    (NOTE) Pre diabetes:          5.7%-6.4%  Diabetes:              >6.4%  Glycemic control for   <7.0% adults with diabetes     Urinalysis: No results for input(s): COLORURINE, LABSPEC, PHURINE, GLUCOSEU, HGBUR, BILIRUBINUR, KETONESUR, PROTEINUR, UROBILINOGEN, NITRITE, LEUKOCYTESUR in the last 72 hours.  Invalid input(s): APPERANCEUR    Imaging: No results found.    Medications:    promethazine  (PHENERGAN ) injection (IM or IVPB) Stopped (12/01/23 1155)    allopurinol   50 mg Oral QODAY   amLODipine   10 mg Oral Daily   ARIPiprazole   2 mg Oral QHS   Chlorhexidine  Gluconate Cloth  6 each Topical Daily   citalopram   40 mg Oral QHS   clotrimazole -betamethasone    Topical BID   doxycycline   100 mg Oral Q12H   insulin  aspart  0-15 Units Subcutaneous TID WC   insulin  aspart  0-5 Units Subcutaneous QHS   insulin  glargine  20 Units Subcutaneous Daily    iron  polysaccharides  150 mg Oral Daily   levothyroxine   100 mcg Oral QAC breakfast   melatonin  10 mg Oral QHS   multivitamin with minerals  1 tablet Oral Daily   nystatin    Topical BID   pantoprazole   40 mg Oral Daily   polyethylene glycol  17 g Oral Daily   rosuvastatin   10 mg Oral Daily   senna-docusate  1 tablet Oral BID    Assessment/ Plan:     68 y.o. female with a PMHx of hypertension, coronary artery disease, atrial fibrillation, congestive heart failure, DVT/PE, history of IVC placement, morbid obesity, diabetes, peripheral vascular disease, chronic kidney disease admitted with history of intentional overdose of OxyContin .  She has been on diuretics.  Now she is found to have worsening renal insufficiency and so renal evaluation is requested.  She has been on torsemide  at 40 mg twice a day.  She has been fluid and dietary noncompliant     Intractable vomiting [R11.10] Dehydration [E86.0] Intractable nausea and vomiting [R11.2]      #1: Acute kidney injury on chronic kidney disease stage IV: Acute kidney injury appears multifactorial, ATN from Bactrim  and diuresis. Patient has chronic kidney disease most likely secondary to diabetic kidney disease complicated by decreased cardiac output during the ischemic renal disease.  Renal ultrasound showed 2.2 cm renal mass, was awaiting outpatient urology follow up.   Creatinine remains stable today. Continue to hold diuretic, would prefer creatinine closer to baseline before restarting. Patient appears stable and can continue recovery outpatient. Will need outpatient follow up with Dr Dennise, [facility]. Will defer discharge plan to primary team    #2: Hypertension with chronic kidney disease. Continue amlodipine  at 10 mg daily. Torsemide  held, as above    #3: Diabetes mellitus type II with chronic kidney disease/renal manifestations: insulin  dependent. Home regimen includes Novolog  and Lantus . Most recent hemoglobin A1c  is 5.7 on  09/04/23.      Primary team to continue management of SSI   #4: Renal mass: Patient is scheduled for CT scan and is scheduled to follow-up with urology.   #5: Anemia with chronic kidney disease: Continue iron  supplementation. Hgb 8.2 at last check    LOS: 23 [facility] 8/31/20259:47 AM

## 2023-12-06 NOTE — Plan of Care (Signed)
  Problem: Education: Goal: Knowledge of General Education information will improve Description: Including pain rating scale, medication(s)/side effects and non-pharmacologic comfort measures Outcome: Adequate for Discharge   Problem: Health Behavior/Discharge Planning: Goal: Ability to manage health-related needs will improve Outcome: Adequate for Discharge   Problem: Clinical Measurements: Goal: Ability to maintain clinical measurements within normal limits will improve Outcome: Adequate for Discharge Goal: Will remain free from infection Outcome: Adequate for Discharge Goal: Diagnostic test results will improve Outcome: Adequate for Discharge Goal: Respiratory complications will improve Outcome: Adequate for Discharge Goal: Cardiovascular complication will be avoided Outcome: Adequate for Discharge   Problem: Activity: Goal: Risk for activity intolerance will decrease Outcome: Adequate for Discharge   Problem: Nutrition: Goal: Adequate nutrition will be maintained Outcome: Adequate for Discharge   Problem: Coping: Goal: Level of anxiety will decrease Outcome: Adequate for Discharge   Problem: Elimination: Goal: Will not experience complications related to bowel motility Outcome: Adequate for Discharge Goal: Will not experience complications related to urinary retention Outcome: Adequate for Discharge   Problem: Pain Managment: Goal: General experience of comfort will improve and/or be controlled Outcome: Adequate for Discharge   Problem: Safety: Goal: Ability to remain free from injury will improve Outcome: Adequate for Discharge   Problem: Skin Integrity: Goal: Risk for impaired skin integrity will decrease Outcome: Adequate for Discharge   Problem: Education: Goal: Ability to describe self-care measures that may prevent or decrease complications (Diabetes Survival Skills Education) will improve Outcome: Adequate for Discharge Goal: Individualized Educational  Video(s) Outcome: Adequate for Discharge   Problem: Coping: Goal: Ability to adjust to condition or change in health will improve Outcome: Adequate for Discharge   Problem: Fluid Volume: Goal: Ability to maintain a balanced intake and output will improve Outcome: Adequate for Discharge   Problem: Health Behavior/Discharge Planning: Goal: Ability to identify and utilize available resources and services will improve Outcome: Adequate for Discharge Goal: Ability to manage health-related needs will improve Outcome: Adequate for Discharge   Problem: Metabolic: Goal: Ability to maintain appropriate glucose levels will improve Outcome: Adequate for Discharge   Problem: Nutritional: Goal: Maintenance of adequate nutrition will improve Outcome: Adequate for Discharge Goal: Progress toward achieving an optimal weight will improve Outcome: Adequate for Discharge   Problem: Skin Integrity: Goal: Risk for impaired skin integrity will decrease Outcome: Adequate for Discharge   Problem: Tissue Perfusion: Goal: Adequacy of tissue perfusion will improve Outcome: Adequate for Discharge   Problem: Acute Rehab OT Goals (only OT should resolve) Goal: Pt. Will Perform Grooming Outcome: Adequate for Discharge Goal: Pt. Will Perform Lower Body Dressing Outcome: Adequate for Discharge Goal: Pt. Will Transfer To Toilet Outcome: Adequate for Discharge Goal: OT Additional ADL Goal #1 Outcome: Adequate for Discharge   Problem: Acute Rehab PT Goals(only PT should resolve) Goal: Pt Will Transfer Bed To Chair/Chair To Bed Outcome: Adequate for Discharge Goal: Pt Will Ambulate Outcome: Adequate for Discharge Goal: Pt Will Go Up/Down Stairs Outcome: Adequate for Discharge   Problem: Increased Nutrient Needs (NI-5.1) Goal: Food and/or nutrient delivery Description: Individualized approach for food/nutrient provision. Outcome: Adequate for Discharge

## 2023-12-06 NOTE — Discharge Summary (Signed)
 Physician Discharge Summary   Patient: [name] MRN: [id number] DOB: May 25, 1955  Admit date:     11/11/2023  Discharge date: 12/06/23  Discharge Physician: AIDA CHO   PCP: Valma Carwin, MD   Recommendations at discharge:   Follow-up with your physician at the nursing home within 3 days of discharge Follow-up with general surgeon on 12/16/2023 as scheduled Follow-up with nephrologist to monitor kidney function within 1 week of discharge  Discharge Diagnoses: Principal Problem:   Intractable nausea and vomiting Active Problems:   Polyuria   Essential hypertension   Paroxysmal atrial fibrillation (HCC)   Type 2 diabetes mellitus with stage 3b chronic kidney disease, with long-term current use of insulin  (HCC)   (HFpEF) heart failure with preserved ejection fraction (HCC)   Morbid obesity with BMI of 50.0-59.9, adult (HCC)   LBBB (left bundle branch block)   Hypothyroidism   Pulmonary HTN, Moderate  PA pressure 09/2009   Thrombocytopenia (HCC)   Drug overdose, intentional, initial encounter (HCC)   CKD (chronic kidney disease) stage 4, GFR 15-29 ml/min (HCC)   Abscess of right axilla   Infection  Resolved Problems:   * No resolved hospital problems. *  Hospital Course:   [name] is a 68 y.o. female [name] is a 68 year old female with pulmonary hypertension, hypertension, atrial fibrillation on Eliquis , history of DVT/PE with IVC, heart failure preserved ejection fraction, morbid obesity, CKD stage IV, chronic respiratory failure requiring 2 L nasal cannula at baseline, anxiety, insulin -dependent diabetes mellitus, who presents emergency department on 11/02/2023 for chief concerns of intentional overdose of OxyContin  on 11/01/2023.   08/01: admitted to behavioral health service on 11/06/2023 for the overdose attempt / SI. 08/06: hospitalist consulted for intractable N/V. Vitals at the time of hospitalist consultation showed temperature of 98.2,  respiration rate 20, heart rate 50, blood pressure 163/60, SpO2 of 97% on 2 L nasal cannula. She also complained of constipation for about 2 weeks. Patient was also found to have weeping edema from the abdomen and weeping edema from left leg with bloody drainage soaking through left Unna boot. Admitted to hospitalist  08/07: palliative care consulted to clarify code status. DNR felt appropriate. Psychiatry also saw patient, no concerns on their end - continue Celexa  and Abilify , no psychiatric contraindications to discharge at this time  08/08: weeping edema from abdomen. Left Unna boot with visible oozing blood soaking through. Started IV Lasix  + albumin .  08/09-08/17: planning DC to SNF, placement pending and has been difficult to secure  08/18: psychiatry reevaluation - pt is cleared from their standpoint and no need for psychiatric admission.  08/19-08/22: placement pending, bed offer was withdrawn from planned facility  08/23: bedisde I&D by hospitalist for abscess R axilla 08/24: ok for placement [facility] pending auth  08/25: pt reporting a bit more soreness today in R axilla - US  to evaluate in case gen surg needs to do more extensive I&D --> US  concerning for abscess, gen surg notified and Dr Desiderio will evaluate / possible to OR tomorrow if needed  08/26: to OR tomorrow for I&D, surgery to recheck tm. Foley out today, void trial.            Assessment and Plan:   Acute on Chronic HFpEF - improved  Recent echo 08/2023 noted EF 60-65%, moderate LVH, normal RV. Recent R/LHC noted no significant CAD, PA pressure 62/16, wedge of 11, PVR 4.4, group 3 pulmonary hypertension suspected Assoc w/ anasarca, ascites, peripheral edema  Torsemide  has been held because of AKI.  This may be resumed when kidney function improves      AKI on CKD (chronic kidney disease) stage 4, GFR 15-29 ml/min Creatinine is stable.  Creatinine slightly up from 3.15-3.19 is basically unchanged. Creatinine had  gone up from 2.33-2.64-3.27-3.38.  Suspect recent use of Bactrim  contributed to AKI.  She was also on torsemide . Baseline creatinine around 1.6-2  Torsemide  has been held since 12/02/2023. Case discussed with Dr. Marcelino, nephrologist and [name], NP with nephrology group.  Dr. Marcelino said creatinine is stable and he is okay with discharge to rehab with her current BUN and creatinine.  She will follow-up with nephrologist as an outpatient for further management.      R axillary abscess-improved Question abscess extension deeper versus enlarged lymph node I&D in OR 12/01/23  Had 7 doses of Bactrim .  Bactrim  discontinued on 12/02/2023. Wound culture showed MRSA that is resistant to Bactrim .  Continue doxycycline  through 12/08/2023     Chronic Lymphedema Venous stasis ulcers of left lower extremity  She had weeping edema (bloody drainage) from left leg. Continue Unna boots on bilateral lower extremities.Pt will most likely need these chronically as edema / skin weeping recurs with each removal      Intractable nausea and vomiting - resolved Suspect due to severe constipation, had not had BM in 2 weeks. Nausea, vomiting and constipation have improved.  Last bowel movement was on 12/05/2023.  Of note, iron  supplement potentially causing N/V, pt has done fine w/ switch to Niferex      Type 2 diabetes mellitus with long-term current use of insulin  Continue Lantus  and NovoLog      Paroxysmal atrial fibrillation, bradycardia HR in the 50s not on rate control drugs Continue Eliquis      Hx UTI, Urine culture from 05/05/2023: Showed Enterobacter cloacae, pansensitive UA with only trace leukocytes, rare bacteria, 0-5 WBC's -- not consistent with UTI. Prior to admission patient was started on Keflex  so completed course for total 5 days.     Hyperkalemia Lokelma  given on 8/14.  Resolved Monitor BMP      Hypothyroid TSH 14.9 elevated, increased dose of Synthroid  from 75 to 100 mcg p.o. daily  8/15 Follow-up outpatient to repeat TSH level after 6 weeks.     Drug overdose, intentional, initial encounter  Major depressive disorder, anxiety Continue Celexa  and Abilify  Pt was initially admitted to medical floor from geropsych unit. One-on-one suicide precautions were lifted per psychiatry on 11/12/23. Per psychiatry, pt can be discharged when medically stable, no longer requires psych admission.     OHS/OSA Pulmonary HTN type III Doesn't tolerate CPAP Supplement O2 as needed Weight loss encouraged       History of DVT and PE s/p IVC filter  Eliquis      Hypertension Low diastolic BP Continue amlodipine      Anemia of chronic disease Iron  profile, B12 within normal range H&H stable Of note, iron  supplement was causing N/V, pt has done fine w/ switch to Niferex so will continue this      Intertrigo, abdominal folds Lotrisone  cream twice daily followed by nystatin  powder Applied Lotrisone  cream on bilateral lower extremities well for possible dermatitis associated with chronic venous stasis/lymphedema    Left renal mass versus complex cyst not clearly visualized on renal ultrasound Outpatient follow-up with urologist recommended.   Her condition has improved and she is deemed stable for discharge to SNF today.          Consultants: Psychiatrist, nephrologist,  palliative care, general surgeon Procedures performed: 08/23: bedside I&D R axillary abscess  08/26: deeper I&D and irrigation in OR R axillary abscess  Disposition: Skilled nursing facility Diet recommendation:  Discharge Diet Orders (From admission, onward)     Start     Ordered   12/06/23 0000  Diet - low sodium heart healthy        12/06/23 1005   12/06/23 0000  Diet Carb Modified        12/06/23 1005           Cardiac and Carb modified diet DISCHARGE MEDICATION: Allergies as of 12/06/2023       Reactions   Ms Contin [morphine] Hives, Rash, Other (See Comments)   Broke out in brown  spots all over.         Medication List     STOP taking these medications    diphenhydramine -acetaminophen  25-500 MG Tabs tablet Commonly known as: TYLENOL  PM   Embecta Pen Needle Nano 2 Gen 32G X 4 MM Misc Generic drug: Insulin  Pen Needle   ferrous sulfate  325 (65 FE) MG EC tablet   guaiFENesin -dextromethorphan  100-10 MG/5ML syrup Commonly known as: ROBITUSSIN DM   HAIR SKIN & NAILS GUMMIES PO   oxyCODONE  5 MG immediate release tablet Commonly known as: Oxy IR/ROXICODONE    torsemide  20 MG tablet Commonly known as: DEMADEX        TAKE these medications    allopurinol  100 MG tablet Commonly known as: ZYLOPRIM  Take 50 mg by mouth every other day.   amLODipine  5 MG tablet Commonly known as: NORVASC  Take 2 tablets (10 mg total) by mouth daily.   ARIPiprazole  2 MG tablet Commonly known as: ABILIFY  Take 1 tablet (2 mg total) by mouth at bedtime.   Centrum Silver Chew Chew 2 each by mouth daily.   citalopram  40 MG tablet Commonly known as: CELEXA  Take 40 mg by mouth at bedtime.   clotrimazole -betamethasone  cream Commonly known as: LOTRISONE  Apply topically 2 (two) times daily for 5 days.   diazepam  5 MG tablet Commonly known as: VALIUM  Take 1 tablet (5 mg total) by mouth 2 (two) times daily as needed for anxiety or muscle spasms.   doxycycline  100 MG tablet Commonly known as: VIBRA -TABS Take 1 tablet (100 mg total) by mouth 2 (two) times daily for 4 doses.   Eliquis  5 MG Tabs tablet Generic drug: apixaban  Take 5 mg by mouth 2 (two) times daily.   insulin  aspart 100 UNIT/ML injection Commonly known as: novoLOG  Inject 5 Units into the skin 3 (three) times daily with meals. What changed: how much to take   iron  polysaccharides 150 MG capsule Commonly known as: NIFEREX Take 1 capsule (150 mg total) by mouth daily. Start taking on: December 07, 2023   Lantus  SoloStar 100 UNIT/ML Solostar Pen Generic drug: insulin  glargine Inject 20 Units into the  skin daily.   levothyroxine  100 MCG tablet Commonly known as: SYNTHROID  Take 1 tablet (100 mcg total) by mouth daily before breakfast. Start taking on: December 07, 2023 What changed:  medication strength how much to take additional instructions   Melatonin 10 MG Tabs Take 10 mg by mouth.   nystatin  powder Commonly known as: MYCOSTATIN /NYSTOP  Apply topically 2 (two) times daily for 5 days.   rosuvastatin  10 MG tablet Commonly known as: CRESTOR  Take 1 tablet (10 mg total) by mouth daily.               Discharge Care Instructions  (From admission, onward)  Start     Ordered   12/06/23 0000  Discharge wound care:       Comments: Wound care  Until discontinued      Comments: 1. Change bilat Una boots to bilat legs Q TUES over dressings as follows: Apply Aquacel Soila # [id number]) to bilat leg wounds Q then cover with foam dressings and abd pads, then apply Una boots and coban. Moisten previous dressings with NS to remove each time.   2. Tuck ABD pad into abd folds and change PRN when wet   12/06/23 1005            Follow-up Information     Valma Carwin, MD Follow up.   Specialty: Internal Medicine Why: hospital follow up Contact information: [address] [facility] [address] [phone]         Terryl Arthea SAUNDERS, PA-C Follow up on 12/16/2023.   Specialty: Physician Assistant Why: Appointment scheduled for 12/16/23 at 3:30 PM.  Please arrive 15 min early.   For right axillary wound check Contact information: [address] [place] [address] [phone]         Marcelino Gales, MD Follow up.   Specialty: Nephrology Contact information: [address] [place] [address] [phone]         Twylla Glendia JAYSON, MD. Schedule an appointment as soon as possible for a visit in 2 week(s).   Specialty: Urology Why: Left renal complex cyst Contact information: [address] [place]  [address] [phone]                Discharge Exam: Fredricka Weights   12/03/23 0500 12/04/23 0440 12/05/23 0500  Weight: (!) 139.5 kg (!) 138.9 kg (!) 137.2 kg   GEN: NAD SKIN: Warm and dry EYES: No pallor or icterus ENT: MMM CV: RRR PULM: CTA B ABD: soft, obese/distended, NT, +BS CNS: AAO x 3, non focal EXT: Chronic bilateral lower extremity lymphedema   Condition at discharge: stable  The results of significant diagnostics from this hospitalization (including imaging, microbiology, ancillary and laboratory) are listed below for reference.   Imaging Studies: US  RT UPPER EXTREM LTD SOFT TISSUE NON VASCULAR Result Date: 11/30/2023 CLINICAL DATA:  Abscess. Axillary abscess and status post bedside I and D. Evaluate for a persistent abscess. EXAM: ULTRASOUND RIGHT UPPER EXTREMITY LIMITED TECHNIQUE: Ultrasound examination of the upper extremity soft tissues was performed in the area of clinical concern. COMPARISON:  None Available. FINDINGS: Poorly defined heterogeneous hypoechoic area at the area of concern in the right axilla. This area roughly measures 1.9 x 1.0 x 1.6 cm. IMPRESSION: Poorly defined superficial heterogeneous area at the area of concern in the right axilla. This area measures up to 1.9 cm. This could represent a small abscess or phlegmon. Electronically Signed   By: Juliene Balder M.D.   On: 11/30/2023 16:29   US  RENAL Result Date: 11/20/2023 CLINICAL DATA:  Acute kidney injury. EXAM: RENAL / URINARY TRACT ULTRASOUND COMPLETE COMPARISON:  03/11/2023 and 09/15/2022. Abdomen and pelvis CT dated 01/18/2013. FINDINGS: Right Kidney: Renal measurements: 9.5 x 5.8 x 4.7 cm = volume: 134 mL. Echogenicity within normal limits. 2.2 cm simple appearing cyst, not requiring imaging follow-up. No mass or hydronephrosis visualized. Left Kidney: Renal measurements: 10.0 x 5.0 x 4.5 cm = volume: 119 mL. Echogenicity within normal limits. Some portions of the kidney are not well visualized  due to shadowing from overlying bowel loops. No mass or hydronephrosis visualized.  Specifically, the previously described 2.2 cm midpole hypoechoic mass was not clearly visualized today. There is a suggestion of a corresponding area partially obscured by overlying bowel-gas on image number 27/32. There is also a similar-appearing area in the lower pole of the left kidney on the CT dated 01/18/2013. Based on the current and previous images, this most likely represents a minimally complicated cyst. Bladder: Appears normal for degree of bladder distention. Other: None. IMPRESSION: 1. No acute abnormality. 2. The previously described 2.2 cm midpole left renal mass was not clearly visualized today. There is a suggestion of a corresponding area partially obscured by overlying bowel-gas on image number 27/32. There is also a similar-appearing area in the lower pole of the left kidney on the CT dated 01/18/2013. Based on the previous images, this most likely represents a minimally complicated cyst. This could be further evaluated with pre and postcontrast abdomen CT or MRI when the patient's condition permits. Electronically Signed   By: Elspeth Bathe M.D.   On: 11/20/2023 16:57   DG Chest Port 1 View Result Date: 11/11/2023 CLINICAL DATA:  Intractable nausea and vomiting EXAM: PORTABLE CHEST 1 VIEW COMPARISON:  11/02/2023 FINDINGS: Single frontal view of the chest demonstrates stable single lead pacer. Cardiac silhouette remains enlarged, with marked dilation of the pulmonary artery again seen consistent with pulmonary arterial hypertension. Stable calcification of the mitral annulus. Chronic central pulmonary vascular congestion with no acute airspace disease, effusion, or pneumothorax. IMPRESSION: 1. Stable enlarged cardiac silhouette and chronic central pulmonary vascular congestion. No overt edema. Electronically Signed   By: Ozell Daring M.D.   On: 11/11/2023 14:50    Microbiology: Results for orders placed or  performed during the hospital encounter of 11/11/23  Aerobic/Anaerobic Culture w Gram Stain (surgical/deep wound)     Status: None (Preliminary result)   Collection Time: 12/01/23  9:54 AM   Specimen: Wound; Tissue  Result Value Ref Range Status   Specimen Description   Final    WOUND Performed at [facility], [address] [place], [place] [address]    Special Requests   Final    RIGHT AXILLA Performed at [facility], [address] [place], [place] [address]    Gram Stain   Final    FEW WBC PRESENT, PREDOMINANTLY PMN NO ORGANISMS SEEN Performed at [facility], [address], [place], [place] [address]    Culture   Final    FEW METHICILLIN RESISTANT STAPHYLOCOCCUS AUREUS FEW DIPHTHEROIDS(CORYNEBACTERIUM SPECIES) Standardized susceptibility testing for this organism is not available. NO ANAEROBES ISOLATED; CULTURE IN PROGRESS FOR 5 DAYS    Report Status PENDING  Incomplete   Organism ID, Bacteria METHICILLIN RESISTANT STAPHYLOCOCCUS AUREUS  Final      Susceptibility   Methicillin resistant staphylococcus aureus - MIC*    CIPROFLOXACIN  >=8 RESISTANT Resistant     ERYTHROMYCIN <=0.25 SENSITIVE Sensitive     GENTAMICIN  <=0.5 SENSITIVE Sensitive     OXACILLIN >=4 RESISTANT Resistant     TETRACYCLINE <=1 SENSITIVE Sensitive     VANCOMYCIN  1 SENSITIVE Sensitive     TRIMETH /SULFA  >=320 RESISTANT Resistant     CLINDAMYCIN <=0.25 SENSITIVE Sensitive     RIFAMPIN <=0.5 SENSITIVE Sensitive     Inducible Clindamycin NEGATIVE Sensitive     LINEZOLID 2 SENSITIVE Sensitive     * FEW METHICILLIN RESISTANT STAPHYLOCOCCUS AUREUS  Aerobic/Anaerobic Culture w Gram Stain (surgical/deep wound)     Status: None (Preliminary result)   Collection Time: 12/01/23 10:08 AM  Specimen: Wound; Abscess  Result Value Ref Range Status   Specimen Description   Final    WOUND Performed at [facility], [address] [place], [place] [address]    Special  Requests   Final    RIGHT AXILLA Performed at [facility], [address] [place], [place] [address]    Gram Stain   Final    FEW WBC PRESENT, PREDOMINANTLY PMN FEW GRAM POSITIVE COCCI Performed at [facility], [address] [place], [place] [address]    Culture   Final    ABUNDANT METHICILLIN RESISTANT STAPHYLOCOCCUS AUREUS NO ANAEROBES ISOLATED; CULTURE IN PROGRESS FOR 5 DAYS    Report Status PENDING  Incomplete   Organism ID, Bacteria METHICILLIN RESISTANT STAPHYLOCOCCUS AUREUS  Final      Susceptibility   Methicillin resistant staphylococcus aureus - MIC*    CIPROFLOXACIN  >=8 RESISTANT Resistant     ERYTHROMYCIN <=0.25 SENSITIVE Sensitive     GENTAMICIN  <=0.5 SENSITIVE Sensitive     OXACILLIN >=4 RESISTANT Resistant     TETRACYCLINE <=1 SENSITIVE Sensitive     VANCOMYCIN  1 SENSITIVE Sensitive     TRIMETH /SULFA  >=320 RESISTANT Resistant     CLINDAMYCIN <=0.25 SENSITIVE Sensitive     RIFAMPIN <=0.5 SENSITIVE Sensitive     Inducible Clindamycin NEGATIVE Sensitive     LINEZOLID 2 SENSITIVE Sensitive     * ABUNDANT METHICILLIN RESISTANT STAPHYLOCOCCUS AUREUS    Labs: CBC: Recent Labs  Lab 12/02/23 0545  WBC 7.3  HGB 8.2*  HCT 27.0*  MCV 100.7*  PLT 157   Basic Metabolic Panel: Recent Labs  Lab 12/02/23 0545 12/03/23 0534 12/04/23 0434 12/05/23 0540 12/06/23 0550  NA 138 138 137 138 140  K 4.8 4.6 3.9 3.8 4.4  CL 97* 98 98 98 100  CO2 32 31 30 30 30   GLUCOSE 175* 111* 132* 131* 145*  BUN 93* 98* 110* 98* 108*  CREATININE 3.27* 3.38* 3.63* 3.15* 3.19*  CALCIUM  9.2 9.1 8.5* 8.6* 8.8*  PHOS  --   --   --  3.7 3.9   Liver Function Tests: Recent Labs  Lab 12/03/23 0534 12/05/23 0540 12/06/23 0550  AST 20  --   --   ALT 9  --   --   ALKPHOS 59  --   --   BILITOT 0.7  --   --   PROT 5.9*  --   --   ALBUMIN  3.3* 2.9* 3.1*   CBG: Recent Labs  Lab 12/05/23 0731 12/05/23 1224 12/05/23 1633 12/05/23 2003 12/06/23 0755  GLUCAP  106* 138* 197* 170* 124*    Discharge time spent: greater than 30 minutes.  Signed: AIDA CHO, MD [facility] 12/06/2023

## 2023-12-06 NOTE — TOC Transition Note (Signed)
 Transition of Care [facility]) - Discharge Note   Patient Details  Name: [name] MRN: [id number] Date of Birth: March 25, 1956  Transition of Care [facility]) CM/SW Contact:  Marinda Cooks, RN Phone Number: 12/06/2023, 12:04 PM   Clinical Narrative:    This CM updated by covering MD pt medically cleared to dc today and has active DC order . This CM spoke with Admission liaison Christy  at [facility] and confirmed pt can dc to SNF today.  DC transportation confirmed for pt with [facility] team updated . No additional DC needs requested by medical team or identified by CM at this time .     Final next level of care: Skilled Nursing Facility Barriers to Discharge: No Barriers Identified   Patient Goals and CMS Choice Patient states their goals for this hospitalization and ongoing recovery are:: To DC to rehab CMS Medicare.gov Compare Post Acute Care list provided to:: Patient Choice offered to / list presented to : Patient      Discharge Placement PASRR number recieved: 11/17/23              Patient to be transferred to facility by: Ambulance Name of family member notified: Patient Patient and family notified of of transfer: 12/06/23      Social Drivers of Health (SDOH) Interventions SDOH Screenings   Food Insecurity: No Food Insecurity (11/11/2023)  Housing: Low Risk  (11/11/2023)  Recent Concern: Housing - High Risk (11/05/2023)  Transportation Needs: No Transportation Needs (11/11/2023)  Utilities: Not At Risk (11/11/2023)  Alcohol Screen: Low Risk  (11/05/2023)  Social Connections: Moderately Isolated (11/11/2023)  Tobacco Use: Medium Risk (12/01/2023)     Readmission Risk Interventions     No data to display

## 2023-12-14 DIAGNOSIS — N281 Cyst of kidney, acquired: Secondary | ICD-10-CM | POA: Insufficient documentation

## 2023-12-14 NOTE — Progress Notes (Deleted)
 12/14/23 1:17 PM   [name] 10-Apr-1955 [id number]  CC: Left renal cyst   HPI: 68 year old female here for initial evaluation of a possible renal cyst Recently admitted August 2025 with drug overdose, also with extremity wounds, right axilla abscess status post I&D RBUS (11/20/2023) for AKI workup, incidental 2.2 cm midpole left renal cyst, indeterminate  History of PAF, DM 2, CKD stage III, CHF, morbid obesity (BMI>50), LBBB, pulmonary hypertension, DVT PE with IVC, chronic respiratory failure requiring 2 L [place] at baseline    PMH: Past Medical History:  Diagnosis Date   A-fib (HCC)    Anemia    Anticoagulated on Coumadin , chronically 09/03/2011   Anxiety    Arthritis    right hip; both knees; left wrist/shoulder; back (01/19/2013   Bleeding on Coumadin  08/2012; 01/18/2013   [facility] admissions (01/19/2013)   CHF (congestive heart failure) (HCC)    2-3 times (01/19/2013)   Chronic lower back pain    Depression    DVT (deep venous thrombosis) (HCC) 10 years ago   numerous/notes 01/18/2013   GERD (gastroesophageal reflux disease)    Gout    Headache(784.0)    maybe weekly (01/19/2013)   Heart murmur    High cholesterol    been off RX for this at one time (01/18/2013)   History of blood transfusion 1983; 04/2012   3 w/ childbirth; hospitalized for pain (01/19/2013)   Hypertension    Hypothyroidism    Migraines    twice/yr maybe (01/19/2013)   Obstructive sleep apnea 05/03/2012   OSA (obstructive sleep apnea)    sent me for test in 04/2012; never ordered mask, etc (01/19/2013)   PE (pulmonary thromboembolism) (HCC) 3 years ag0   3/notes 01/18/2013   Pneumonia before 2011   once' (01/18/2013)   Renal disorder    kindey function low; Metformin was destroying my kidneys (01/19/2013)   Shortness of breath    only related to my CHF (01/18/2013)   Swelling of hand 08/31/2014   RT HAND   Type II diabetes mellitus (HCC)    UTI (urinary tract  infection) 08/31/2014    Surgical History: Past Surgical History:  Procedure Laterality Date   CARDIAC CATHETERIZATION N/A 03/13/2015   Procedure: Right/Left Heart Cath and Coronary Angiography;  Surgeon: Gordy Bergamo, MD;  Location: [facility] INVASIVE CV LAB;  Service: Cardiovascular;  Laterality: N/A;   CATARACT EXTRACTION W/ INTRAOCULAR LENS  IMPLANT, BILATERAL Bilateral 2006-2011   CESAREAN SECTION  1983   CHOLECYSTECTOMY  ~ 2002   COLONOSCOPY N/A 01/21/2013   Procedure: COLONOSCOPY;  Surgeon: Belvie JONETTA Just, MD;  Location: [facility] ENDOSCOPY;  Service: Endoscopy;  Laterality: N/A;   EYE SURGERY Bilateral    multiple (01/18/2013)   INCISION AND DRAINAGE OF WOUND Right 12/01/2023   Procedure: IRRIGATION AND DEBRIDEMENT WOUND;  Surgeon: Desiderio Schanz, MD;  Location: [facility] ORS;  Service: General;  Laterality: Right;   PACEMAKER IMPLANT N/A 08/31/2018   Symptomatic bradycardia due to mobitz II second degree AV block, permanent afib/ atypical atrial flutter implanted by Dr Kelsie PRESCOTT PLANA REPAIR OF RETINAL DEATACHMENT Right    PARS PLANA VITRECTOMY Bilateral 2004-2006   several (01/18/2013)   REFRACTIVE SURGERY Bilateral    for stigmatism (01/18/2013)   REFRACTIVE SURGERY Left ~ 11/2012   to puff it up cause vision got hazy (01/18/2013)   RIGHT HEART CATH N/A 12/30/2018   Procedure: RIGHT HEART CATH;  Surgeon: Cherrie Toribio SAUNDERS, MD;  Location: [facility] INVASIVE CV LAB;  Service:  Cardiovascular;  Laterality: N/A;   RIGHT/LEFT HEART CATH AND CORONARY ANGIOGRAPHY N/A 09/07/2023   Procedure: RIGHT/LEFT HEART CATH AND CORONARY ANGIOGRAPHY;  Surgeon: Wendel Lurena POUR, MD;  Location: [facility] INVASIVE CV LAB;  Service: Cardiovascular;  Laterality: N/A;   VENA CAVA FILTER PLACEMENT  2011?    Family History: Family History  Problem Relation Age of Onset   Cerebral aneurysm Mother    Hypertension Father    Cerebral aneurysm Maternal Grandfather    Cerebral aneurysm Maternal Aunt    Cancer Maternal Uncle      Social History:  reports that she quit smoking about 35 years ago. Her smoking use included cigarettes. She started smoking about 65 years ago. She has a 1.5 pack-year smoking history. She has never used smokeless tobacco. She reports that she does not currently use alcohol . She reports that she does not use drugs.      Physical Exam: There were no vitals taken for this visit.   Constitutional:  Alert and oriented, No acute distress. Cardiovascular: No clubbing, cyanosis, or edema. Respiratory: Normal respiratory effort, no increased work of breathing. GI: Nondistended GU:   Latest Reference Range & Units 12/06/23 05:50  Creatinine 0.44 - 1.00 mg/dL 6.80 (H)  (H): Data is abnormally high Skin: No rashes, bruises or suspicious lesions. Neurologic: Grossly intact, no focal deficits, moving all 4 extremities. Psychiatric: Normal mood and affect.  Laboratory Data: ***   Pertinent Imaging: I have personally viewed and interpreted the renal bladder ultrasound 11/20/2023-similar to read, I am not able to confidently visualize a left interpolar cystic lesion.  Bilateral kidneys nondilated, otherwise morphologically normal. I also reviewed renal ultrasound from June 2024-I do see a interpolar 1-2 cm multiloculated versus side-by-side cyst.  Appears minimally complex, nonsolid, some internal hyper echogenicity    Assessment & Plan:    Renal cyst Assessment & Plan: Possible ~2 cm left interpolar Renal cyst, via RBUS (June 2024)  RBUS (Sept 2025) -unable to visualize renal cyst  I reviewed her clinical history and recent imaging.  This does appear to represent a small renal cyst, unable to fully characterize based on recent ultrasound alone, however high likelihood of benign entity.  I would not be overly aggressive in workup, particularly in the context of her significant comorbidity.   - Recommend interval RBUS in 6 months.  May consider MRU in the future if elevated concern for  underlying malignancy        Penne Skye, MD 12/14/2023  [facility] [address], [address] [place], [place] [address] [phone]

## 2023-12-14 NOTE — Assessment & Plan Note (Deleted)
 Possible ~2 cm left interpolar Renal cyst, via RBUS (June 2024)  RBUS (Sept 2025) -unable to visualize renal cyst  I reviewed her clinical history and recent imaging.  This does appear to represent a small renal cyst, unable to fully characterize based on recent ultrasound alone, however high likelihood of benign entity.  I would not be overly aggressive in workup, particularly in the context of her significant comorbidity.   - Recommend interval RBUS in 6 months.  May consider MRU in the future if elevated concern for underlying malignancy

## 2023-12-15 ENCOUNTER — Emergency Department (HOSPITAL_COMMUNITY)

## 2023-12-15 ENCOUNTER — Other Ambulatory Visit: Payer: Self-pay

## 2023-12-15 ENCOUNTER — Inpatient Hospital Stay (HOSPITAL_COMMUNITY)
Admission: EM | Admit: 2023-12-15 | Discharge: 2023-12-31 | DRG: 291 | Disposition: A | Discharge status: Hospice | Source: Skilled Nursing Facility | Attending: Internal Medicine | Admitting: Internal Medicine

## 2023-12-15 DIAGNOSIS — F329 Major depressive disorder, single episode, unspecified: Secondary | ICD-10-CM | POA: Diagnosis present

## 2023-12-15 DIAGNOSIS — Z8249 Family history of ischemic heart disease and other diseases of the circulatory system: Secondary | ICD-10-CM

## 2023-12-15 DIAGNOSIS — Z7189 Other specified counseling: Secondary | ICD-10-CM

## 2023-12-15 DIAGNOSIS — E039 Hypothyroidism, unspecified: Secondary | ICD-10-CM | POA: Diagnosis present

## 2023-12-15 DIAGNOSIS — Z95828 Presence of other vascular implants and grafts: Secondary | ICD-10-CM

## 2023-12-15 DIAGNOSIS — R627 Adult failure to thrive: Secondary | ICD-10-CM | POA: Diagnosis present

## 2023-12-15 DIAGNOSIS — E7800 Pure hypercholesterolemia, unspecified: Secondary | ICD-10-CM | POA: Diagnosis present

## 2023-12-15 DIAGNOSIS — Z794 Long term (current) use of insulin: Secondary | ICD-10-CM

## 2023-12-15 DIAGNOSIS — Z9981 Dependence on supplemental oxygen: Secondary | ICD-10-CM

## 2023-12-15 DIAGNOSIS — I890 Lymphedema, not elsewhere classified: Secondary | ICD-10-CM | POA: Diagnosis present

## 2023-12-15 DIAGNOSIS — J189 Pneumonia, unspecified organism: Principal | ICD-10-CM | POA: Diagnosis present

## 2023-12-15 DIAGNOSIS — Z9841 Cataract extraction status, right eye: Secondary | ICD-10-CM

## 2023-12-15 DIAGNOSIS — K769 Liver disease, unspecified: Secondary | ICD-10-CM

## 2023-12-15 DIAGNOSIS — C221 Intrahepatic bile duct carcinoma: Secondary | ICD-10-CM | POA: Diagnosis present

## 2023-12-15 DIAGNOSIS — N200 Calculus of kidney: Secondary | ICD-10-CM | POA: Diagnosis present

## 2023-12-15 DIAGNOSIS — I130 Hypertensive heart and chronic kidney disease with heart failure and stage 1 through stage 4 chronic kidney disease, or unspecified chronic kidney disease: Secondary | ICD-10-CM | POA: Diagnosis not present

## 2023-12-15 DIAGNOSIS — B9689 Other specified bacterial agents as the cause of diseases classified elsewhere: Secondary | ICD-10-CM | POA: Diagnosis present

## 2023-12-15 DIAGNOSIS — E1140 Type 2 diabetes mellitus with diabetic neuropathy, unspecified: Secondary | ICD-10-CM | POA: Diagnosis present

## 2023-12-15 DIAGNOSIS — Z79899 Other long term (current) drug therapy: Secondary | ICD-10-CM

## 2023-12-15 DIAGNOSIS — I2510 Atherosclerotic heart disease of native coronary artery without angina pectoris: Secondary | ICD-10-CM | POA: Diagnosis present

## 2023-12-15 DIAGNOSIS — Z515 Encounter for palliative care: Secondary | ICD-10-CM

## 2023-12-15 DIAGNOSIS — N179 Acute kidney failure, unspecified: Secondary | ICD-10-CM | POA: Diagnosis present

## 2023-12-15 DIAGNOSIS — N390 Urinary tract infection, site not specified: Secondary | ICD-10-CM | POA: Diagnosis present

## 2023-12-15 DIAGNOSIS — Z961 Presence of intraocular lens: Secondary | ICD-10-CM | POA: Diagnosis present

## 2023-12-15 DIAGNOSIS — I5033 Acute on chronic diastolic (congestive) heart failure: Secondary | ICD-10-CM | POA: Diagnosis present

## 2023-12-15 DIAGNOSIS — Z66 Do not resuscitate: Secondary | ICD-10-CM | POA: Diagnosis present

## 2023-12-15 DIAGNOSIS — I447 Left bundle-branch block, unspecified: Secondary | ICD-10-CM | POA: Diagnosis present

## 2023-12-15 DIAGNOSIS — Z992 Dependence on renal dialysis: Secondary | ICD-10-CM

## 2023-12-15 DIAGNOSIS — Z9049 Acquired absence of other specified parts of digestive tract: Secondary | ICD-10-CM

## 2023-12-15 DIAGNOSIS — Z9842 Cataract extraction status, left eye: Secondary | ICD-10-CM

## 2023-12-15 DIAGNOSIS — C787 Secondary malignant neoplasm of liver and intrahepatic bile duct: Secondary | ICD-10-CM

## 2023-12-15 DIAGNOSIS — R7989 Other specified abnormal findings of blood chemistry: Secondary | ICD-10-CM

## 2023-12-15 DIAGNOSIS — G4733 Obstructive sleep apnea (adult) (pediatric): Secondary | ICD-10-CM | POA: Diagnosis present

## 2023-12-15 DIAGNOSIS — L97929 Non-pressure chronic ulcer of unspecified part of left lower leg with unspecified severity: Secondary | ICD-10-CM | POA: Diagnosis present

## 2023-12-15 DIAGNOSIS — Z7901 Long term (current) use of anticoagulants: Secondary | ICD-10-CM

## 2023-12-15 DIAGNOSIS — K5900 Constipation, unspecified: Secondary | ICD-10-CM | POA: Diagnosis not present

## 2023-12-15 DIAGNOSIS — J9611 Chronic respiratory failure with hypoxia: Secondary | ICD-10-CM | POA: Diagnosis present

## 2023-12-15 DIAGNOSIS — Z86711 Personal history of pulmonary embolism: Secondary | ICD-10-CM

## 2023-12-15 DIAGNOSIS — E66813 Obesity, class 3: Secondary | ICD-10-CM | POA: Diagnosis present

## 2023-12-15 DIAGNOSIS — N184 Chronic kidney disease, stage 4 (severe): Secondary | ICD-10-CM | POA: Diagnosis present

## 2023-12-15 DIAGNOSIS — Z86718 Personal history of other venous thrombosis and embolism: Secondary | ICD-10-CM

## 2023-12-15 DIAGNOSIS — Z950 Presence of cardiac pacemaker: Secondary | ICD-10-CM

## 2023-12-15 DIAGNOSIS — D631 Anemia in chronic kidney disease: Secondary | ICD-10-CM | POA: Diagnosis present

## 2023-12-15 DIAGNOSIS — Z885 Allergy status to narcotic agent status: Secondary | ICD-10-CM

## 2023-12-15 DIAGNOSIS — Z87891 Personal history of nicotine dependence: Secondary | ICD-10-CM

## 2023-12-15 DIAGNOSIS — Z79890 Hormone replacement therapy (postmenopausal): Secondary | ICD-10-CM

## 2023-12-15 DIAGNOSIS — Z6841 Body Mass Index (BMI) 40.0 and over, adult: Secondary | ICD-10-CM

## 2023-12-15 DIAGNOSIS — E1122 Type 2 diabetes mellitus with diabetic chronic kidney disease: Secondary | ICD-10-CM | POA: Diagnosis present

## 2023-12-15 DIAGNOSIS — I4821 Permanent atrial fibrillation: Secondary | ICD-10-CM | POA: Diagnosis present

## 2023-12-15 LAB — CBC WITH DIFFERENTIAL/PLATELET
Abs Immature Granulocytes: 0.03 K/uL (ref 0.00–0.07)
Basophils Absolute: 0 K/uL (ref 0.0–0.1)
Basophils Relative: 0 %
Eosinophils Absolute: 0.1 K/uL (ref 0.0–0.5)
Eosinophils Relative: 2 %
HCT: 29.8 % — ABNORMAL LOW (ref 36.0–46.0)
Hemoglobin: 8.7 g/dL — ABNORMAL LOW (ref 12.0–15.0)
Immature Granulocytes: 0 %
Lymphocytes Relative: 4 %
Lymphs Abs: 0.4 K/uL — ABNORMAL LOW (ref 0.7–4.0)
MCH: 30.5 pg (ref 26.0–34.0)
MCHC: 29.2 g/dL — ABNORMAL LOW (ref 30.0–36.0)
MCV: 104.6 fL — ABNORMAL HIGH (ref 80.0–100.0)
Monocytes Absolute: 0.6 K/uL (ref 0.1–1.0)
Monocytes Relative: 7 %
Neutro Abs: 7 K/uL (ref 1.7–7.7)
Neutrophils Relative %: 87 %
Platelets: 154 K/uL (ref 150–400)
RBC: 2.85 MIL/uL — ABNORMAL LOW (ref 3.87–5.11)
RDW: 16.6 % — ABNORMAL HIGH (ref 11.5–15.5)
WBC: 8.1 K/uL (ref 4.0–10.5)
nRBC: 0 % (ref 0.0–0.2)

## 2023-12-15 LAB — COMPREHENSIVE METABOLIC PANEL WITH GFR
ALT: 16 U/L (ref 0–44)
AST: 23 U/L (ref 15–41)
Albumin: 3.1 g/dL — ABNORMAL LOW (ref 3.5–5.0)
Alkaline Phosphatase: 79 U/L (ref 38–126)
Anion gap: 14 (ref 5–15)
BUN: 135 mg/dL — ABNORMAL HIGH (ref 8–23)
CO2: 24 mmol/L (ref 22–32)
Calcium: 8.6 mg/dL — ABNORMAL LOW (ref 8.9–10.3)
Chloride: 98 mmol/L (ref 98–111)
Creatinine, Ser: 3.65 mg/dL — ABNORMAL HIGH (ref 0.44–1.00)
GFR, Estimated: 13 mL/min — ABNORMAL LOW (ref >60)
Glucose, Bld: 225 mg/dL — ABNORMAL HIGH (ref 70–99)
Potassium: 4.6 mmol/L (ref 3.5–5.1)
Sodium: 136 mmol/L (ref 135–145)
Total Bilirubin: 0.5 mg/dL (ref 0.0–1.2)
Total Protein: 5.7 g/dL — ABNORMAL LOW (ref 6.5–8.1)

## 2023-12-15 LAB — BRAIN NATRIURETIC PEPTIDE: B Natriuretic Peptide: 583.7 pg/mL — ABNORMAL HIGH (ref 0.0–100.0)

## 2023-12-15 LAB — TROPONIN I (HIGH SENSITIVITY): Troponin I (High Sensitivity): 124 ng/L (ref <18)

## 2023-12-15 MED ORDER — AZITHROMYCIN 500 MG IV SOLR
500.0000 mg | Freq: Once | INTRAVENOUS | Status: AC
  Administered 2023-12-16: 500 mg via INTRAVENOUS
  Filled 2023-12-15: qty 5

## 2023-12-15 MED ORDER — SODIUM CHLORIDE 0.9 % IV SOLN
1.0000 g | Freq: Once | INTRAVENOUS | Status: AC
  Administered 2023-12-15: 1 g via INTRAVENOUS
  Filled 2023-12-15: qty 10

## 2023-12-15 NOTE — ED Notes (Signed)
 CCMD called and patient placed on monitor

## 2023-12-15 NOTE — ED Triage Notes (Signed)
 Patient brought in by EMS from [place] with onset of chest pain that started at 1930. Patient states it feels electrical, pulsating and stabbing, and radiates down left arm. Patient has a history of CHF, HTN and have a pacemaker. Patient was told today that she would be placed on dialysis.

## 2023-12-15 NOTE — ED Provider Notes (Signed)
 [facility] EMERGENCY DEPARTMENT AT [facility] Provider Note   CSN: [id number] Arrival date & time: 12/15/23  2040     Patient presents with: Chest Pain   [name] is a 68 y.o. female.  {Add pertinent medical, surgical, social history, OB history to HPI:32947} HPI     68 year old female with a history of paroxysmal atrial fibrillation, hypertension, type 2 diabetes, heart failure with a preserved ejection fraction, left bundle branch block, hypothyroidism, pulmonary hypertension, CKD stage IV   Chest pain and shortness of breath Chest pain started, if wearing O2 will need to take it back to go to the bathroom. For last few days has had chest pain, more severe tonight at 1930, feels like a pulsing pain. Denies radiation down arm, goes down side.  Does have location of side pain like kidney infection Nausea, cough to the point of vomiting at times, every day No diarrhea or constipation  No abdominal pain Burning with urination for a few days  Has had ongoing swelling over last month  Worsening weakness, fatigue, dyspnea on exertion. Wears 2L of O2 at home  Past Medical History:  Diagnosis Date   A-fib (HCC)    Anemia    Anticoagulated on Coumadin , chronically 09/03/2011   Anxiety    Arthritis    right hip; both knees; left wrist/shoulder; back (01/19/2013   Bleeding on Coumadin  08/2012; 01/18/2013   [facility] admissions (01/19/2013)   CHF (congestive heart failure) (HCC)    2-3 times (01/19/2013)   Chronic lower back pain    Depression    DVT (deep venous thrombosis) (HCC) 10 years ago   numerous/notes 01/18/2013   GERD (gastroesophageal reflux disease)    Gout    Headache(784.0)    maybe weekly (01/19/2013)   Heart murmur    High cholesterol    been off RX for this at one time (01/18/2013)   History of blood transfusion 1983; 04/2012   3 w/ childbirth; hospitalized for pain (01/19/2013)   Hypertension    Hypothyroidism    Migraines     twice/yr maybe (01/19/2013)   Obstructive sleep apnea 05/03/2012   OSA (obstructive sleep apnea)    sent me for test in 04/2012; never ordered mask, etc (01/19/2013)   PE (pulmonary thromboembolism) (HCC) 3 years ag0   3/notes 01/18/2013   Pneumonia before 2011   once' (01/18/2013)   Renal disorder    kindey function low; Metformin was destroying my kidneys (01/19/2013)   Shortness of breath    only related to my CHF (01/18/2013)   Swelling of hand 08/31/2014   RT HAND   Type II diabetes mellitus (HCC)    UTI (urinary tract infection) 08/31/2014     Prior to Admission medications   Medication Sig Start Date End Date Taking? Authorizing Provider  allopurinol  (ZYLOPRIM ) 100 MG tablet Take 50 mg by mouth every other day. 10/28/23   [provider]  amLODipine  (NORVASC ) 5 MG tablet Take 2 tablets (10 mg total) by mouth daily. 10/26/23   Milford, Harlene HERO, FNP  apixaban  (ELIQUIS ) 5 MG TABS tablet Take 5 mg by mouth 2 (two) times daily.    [provider]  ARIPiprazole  (ABILIFY ) 2 MG tablet Take 1 tablet (2 mg total) by mouth at bedtime. 12/06/23   Jens Durand, MD  citalopram  (CELEXA ) 40 MG tablet Take 40 mg by mouth at bedtime. 10/28/23   [provider]  diazepam  (VALIUM ) 5 MG tablet Take 1 tablet (5 mg total) by  mouth 2 (two) times daily as needed for anxiety or muscle spasms. 12/06/23   Jens Durand, MD  insulin  aspart (NOVOLOG ) 100 UNIT/ML injection Inject 5 Units into the skin 3 (three) times daily with meals. 12/06/23   Jens Durand, MD  insulin  glargine (LANTUS  SOLOSTAR) 100 UNIT/ML Solostar Pen Inject 20 Units into the skin daily.    [provider]  iron  polysaccharides (NIFEREX) 150 MG capsule Take 1 capsule (150 mg total) by mouth daily. 12/07/23   Jens Durand, MD  levothyroxine  (SYNTHROID ) 100 MCG tablet Take 1 tablet (100 mcg total) by mouth daily before breakfast. 12/07/23   Jens Durand, MD  Melatonin 10 MG TABS Take 10 mg by mouth.     [provider]  Multiple Vitamins-Minerals (CENTRUM SILVER) CHEW Chew 2 each by mouth daily.    [provider]  rosuvastatin  (CRESTOR ) 10 MG tablet Take 1 tablet (10 mg total) by mouth daily. 05/09/16   Epifanio Rolland Robin, MD    Allergies: Ms contin [morphine]    Review of Systems  Updated Vital Signs BP (!) 144/56 (BP Location: Right Wrist)   Pulse (!) 50   Temp 98.1 F (36.7 C) (Oral)   Resp 17   Ht 5' 1 (1.549 m)   Wt (!) 138.8 kg   SpO2 100%   BMI 57.82 kg/m   Physical Exam  (all labs ordered are listed, but only abnormal results are displayed) Labs Reviewed  BASIC METABOLIC PANEL WITH GFR  CBC  TROPONIN I (HIGH SENSITIVITY)    EKG: EKG Interpretation Date/Time:  Tuesday December 15 2023 20:51:11 EDT Ventricular Rate:  54 PR Interval:    QRS Duration:  152 QT Interval:  499 QTC Calculation: 456 R Axis:   -81  Text Interpretation: Atrial fibrillation Paired ventricular premature complexes IVCD, consider atypical RBBB LVH with secondary repolarization abnormality Anterolateral infarct, age indeterminate ST depressions lateral leads Confirmed by Dreama Longs ([id number]) on 12/15/2023 8:58:53 PM  Radiology: No results found.  {Document cardiac monitor, telemetry assessment procedure when appropriate:32947} Procedures   Medications Ordered in the ED - No data to display    {Click here for ABCD2, HEART and other calculators REFRESH Note before signing:1}                              Medical Decision Making Amount and/or Complexity of Data Reviewed Labs: ordered. Radiology: ordered.   ***  {Document critical care time when appropriate  Document review of labs and clinical decision tools ie CHADS2VASC2, etc  Document your independent review of radiology images and any outside records  Document your discussion with family members, caretakers and with consultants  Document social determinants of health affecting pt's care  Document  your decision making why or why not admission, treatments were needed:32947:::1}   Final diagnoses:  None    ED Discharge Orders     None

## 2023-12-16 ENCOUNTER — Encounter: Admitting: Physician Assistant

## 2023-12-16 ENCOUNTER — Observation Stay (HOSPITAL_COMMUNITY)

## 2023-12-16 DIAGNOSIS — J189 Pneumonia, unspecified organism: Secondary | ICD-10-CM | POA: Diagnosis not present

## 2023-12-16 LAB — URINALYSIS, W/ REFLEX TO CULTURE (INFECTION SUSPECTED)
Bilirubin Urine: NEGATIVE
Glucose, UA: NEGATIVE mg/dL
Ketones, ur: NEGATIVE mg/dL
Nitrite: NEGATIVE
Protein, ur: 30 mg/dL — AB
Specific Gravity, Urine: 1.013 (ref 1.005–1.030)
WBC, UA: >50 WBC/hpf (ref 0–5)
pH: 5 (ref 5.0–8.0)

## 2023-12-16 LAB — GLUCOSE, CAPILLARY
Glucose-Capillary: 199 mg/dL — ABNORMAL HIGH (ref 70–99)
Glucose-Capillary: 199 mg/dL — ABNORMAL HIGH (ref 70–99)

## 2023-12-16 LAB — TROPONIN I (HIGH SENSITIVITY): Troponin I (High Sensitivity): 105 ng/L (ref <18)

## 2023-12-16 LAB — LACTIC ACID, PLASMA: Lactic Acid, Venous: 1 mmol/L (ref 0.5–1.9)

## 2023-12-16 MED ORDER — SODIUM CHLORIDE 0.9 % IV SOLN
1.0000 g | INTRAVENOUS | Status: DC
  Administered 2023-12-17 – 2023-12-19 (×3): 1 g via INTRAVENOUS
  Filled 2023-12-16 (×3): qty 10

## 2023-12-16 MED ORDER — IOHEXOL 350 MG/ML SOLN
100.0000 mL | Freq: Once | INTRAVENOUS | Status: AC | PRN
Start: 2023-12-16 — End: 2023-12-16
  Administered 2023-12-16: 100 mL via INTRAVENOUS

## 2023-12-16 MED ORDER — ACETAMINOPHEN 500 MG PO TABS
1000.0000 mg | ORAL_TABLET | Freq: Three times a day (TID) | ORAL | Status: DC
Start: 2023-12-16 — End: 2023-12-31
  Administered 2023-12-16 – 2023-12-31 (×39): 1000 mg via ORAL
  Filled 2023-12-16 (×42): qty 2

## 2023-12-16 MED ORDER — INSULIN GLARGINE 100 UNIT/ML ~~LOC~~ SOLN
20.0000 [IU] | Freq: Every day | SUBCUTANEOUS | Status: DC
  Administered 2023-12-18 – 2023-12-24 (×6): 20 [IU] via SUBCUTANEOUS
  Filled 2023-12-16 (×10): qty 0.2

## 2023-12-16 MED ORDER — AMLODIPINE BESYLATE 10 MG PO TABS
10.0000 mg | ORAL_TABLET | Freq: Every day | ORAL | Status: DC
Start: 2023-12-16 — End: 2023-12-25
  Administered 2023-12-16 – 2023-12-25 (×9): 10 mg via ORAL
  Filled 2023-12-16 (×10): qty 1

## 2023-12-16 MED ORDER — HYDROMORPHONE HCL 1 MG/ML IJ SOLN
0.5000 mg | INTRAMUSCULAR | Status: DC | PRN
  Administered 2023-12-16 – 2023-12-25 (×12): 0.5 mg via INTRAVENOUS
  Filled 2023-12-16 (×12): qty 0.5

## 2023-12-16 MED ORDER — ENOXAPARIN SODIUM 40 MG/0.4ML IJ SOSY
40.0000 mg | PREFILLED_SYRINGE | Freq: Every day | INTRAMUSCULAR | Status: DC
  Administered 2023-12-16: 40 mg via SUBCUTANEOUS
  Filled 2023-12-16: qty 0.4

## 2023-12-16 MED ORDER — MELATONIN 5 MG PO TABS
10.0000 mg | ORAL_TABLET | Freq: Every day | ORAL | Status: DC
  Administered 2023-12-16 – 2023-12-30 (×15): 10 mg via ORAL
  Filled 2023-12-16 (×15): qty 2

## 2023-12-16 MED ORDER — AZITHROMYCIN 500 MG PO TABS
500.0000 mg | ORAL_TABLET | Freq: Every day | ORAL | Status: AC
  Administered 2023-12-17 – 2023-12-18 (×2): 500 mg via ORAL
  Filled 2023-12-16 (×2): qty 1

## 2023-12-16 MED ORDER — DIAZEPAM 5 MG PO TABS: 5.0000 mg | ORAL_TABLET | Freq: Two times a day (BID) | ORAL | Status: DC | PRN

## 2023-12-16 MED ORDER — FUROSEMIDE 10 MG/ML IJ SOLN
80.0000 mg | Freq: Two times a day (BID) | INTRAMUSCULAR | Status: AC
  Administered 2023-12-16: 80 mg via INTRAVENOUS
  Filled 2023-12-16 (×2): qty 8

## 2023-12-16 MED ORDER — ROSUVASTATIN CALCIUM 5 MG PO TABS
10.0000 mg | ORAL_TABLET | Freq: Every day | ORAL | Status: DC
Start: 2023-12-16 — End: 2023-12-25
  Administered 2023-12-16 – 2023-12-25 (×9): 10 mg via ORAL
  Filled 2023-12-16 (×10): qty 2

## 2023-12-16 MED ORDER — APIXABAN 5 MG PO TABS
5.0000 mg | ORAL_TABLET | Freq: Two times a day (BID) | ORAL | Status: DC
Start: 2023-12-16 — End: 2023-12-17
  Administered 2023-12-16: 5 mg via ORAL
  Filled 2023-12-16 (×2): qty 1

## 2023-12-16 MED ORDER — ARIPIPRAZOLE 2 MG PO TABS
2.0000 mg | ORAL_TABLET | Freq: Every day | ORAL | Status: DC
  Administered 2023-12-16 – 2023-12-30 (×14): 2 mg via ORAL
  Filled 2023-12-16 (×16): qty 1

## 2023-12-16 MED ORDER — CITALOPRAM HYDROBROMIDE 20 MG PO TABS
40.0000 mg | ORAL_TABLET | Freq: Every day | ORAL | Status: DC
Start: 2023-12-16 — End: 2023-12-17
  Administered 2023-12-16: 40 mg via ORAL
  Filled 2023-12-16: qty 2

## 2023-12-16 MED ORDER — LEVOTHYROXINE SODIUM 100 MCG PO TABS
100.0000 ug | ORAL_TABLET | Freq: Every day | ORAL | Status: DC
  Administered 2023-12-18 – 2023-12-25 (×7): 100 ug via ORAL
  Filled 2023-12-16 (×8): qty 1

## 2023-12-16 MED ORDER — INSULIN ASPART 100 UNIT/ML IJ SOLN
0.0000 [IU] | Freq: Three times a day (TID) | INTRAMUSCULAR | Status: DC
  Administered 2023-12-16 – 2023-12-17 (×2): 3 [IU] via SUBCUTANEOUS
  Administered 2023-12-17: 5 [IU] via SUBCUTANEOUS
  Administered 2023-12-18 (×2): 3 [IU] via SUBCUTANEOUS
  Administered 2023-12-19: 2 [IU] via SUBCUTANEOUS
  Administered 2023-12-20: 3 [IU] via SUBCUTANEOUS
  Administered 2023-12-20 – 2023-12-25 (×4): 2 [IU] via SUBCUTANEOUS

## 2023-12-16 NOTE — H&P (Addendum)
 History and Physical    Patient: [name] FMW:[id number] DOB: 02/26/1956 DOA: 12/15/2023 DOS: the patient was seen and examined on 12/16/2023 PCP: Valma Carwin, MD  Patient coming from: SNF  Chief Complaint:  Chief Complaint  Patient presents with   Chest Pain   HPI: [name] is a 68 y.o. female long-term resident at SNF with medical history significant of pulmonary hypertension, chronic respiratory failure requiring 2L [place] at baseline, Afib on Eliquis , HFpEF (EF 60-65% in 08/2023), DM2 (A1c 5.7 in 08/2023), DVT/PE x3 s/p IVC filter in 09/2009, CKD stage IV, and morbid obesity p/w SOB/cp iso volume overload 2/2 LLL CAP and HFpEF exacerbation c/b worsening renal failure and found to have liver lesions c/f metastases.   Pt is a limited historian. From what I can gather per Epic review, pt presented with cp and SOB and found to have CAP, and liver lesions c/f mets. Per triage RN, pt brought in by EMS from [place] with onset of chest pain that started at 1930. Patient states it feels electrical, pulsating and stabbing, and radiates down left arm.  In the ED, pt tachypneic w/ hypoxia requiring 3L [place] (pta 2L ). Labs notable for Cr 3.65 (baseline 3.1-3.2), BNP 583, and lactic acid 1.UA w/ LE/many bacteria, but neg for nitrite. Blood and urine culture pending. CT renal showed liver lesions c/f mets and LLL CAP. EDP started IV abx and requested medicine admission.    Review of Systems: As mentioned in the history of present illness. All other systems reviewed and are negative. Past Medical History:  Diagnosis Date   A-fib (HCC)    Anemia    Anticoagulated on Coumadin , chronically 09/03/2011   Anxiety    Arthritis    right hip; both knees; left wrist/shoulder; back (01/19/2013   Bleeding on Coumadin  08/2012; 01/18/2013   [facility] admissions (01/19/2013)   CHF (congestive heart failure) (HCC)    2-3 times (01/19/2013)   Chronic lower back pain    Depression    DVT (deep venous  thrombosis) (HCC) 10 years ago   numerous/notes 01/18/2013   GERD (gastroesophageal reflux disease)    Gout    Headache(784.0)    maybe weekly (01/19/2013)   Heart murmur    High cholesterol    been off RX for this at one time (01/18/2013)   History of blood transfusion 1983; 04/2012   3 w/ childbirth; hospitalized for pain (01/19/2013)   Hypertension    Hypothyroidism    Migraines    twice/yr maybe (01/19/2013)   Obstructive sleep apnea 05/03/2012   OSA (obstructive sleep apnea)    sent me for test in 04/2012; never ordered mask, etc (01/19/2013)   PE (pulmonary thromboembolism) (HCC) 3 years ag0   3/notes 01/18/2013   Pneumonia before 2011   once' (01/18/2013)   Renal disorder    kindey function low; Metformin was destroying my kidneys (01/19/2013)   Shortness of breath    only related to my CHF (01/18/2013)   Swelling of hand 08/31/2014   RT HAND   Type II diabetes mellitus (HCC)    UTI (urinary tract infection) 08/31/2014   Past Surgical History:  Procedure Laterality Date   CARDIAC CATHETERIZATION N/A 03/13/2015   Procedure: Right/Left Heart Cath and Coronary Angiography;  Surgeon: Gordy Bergamo, MD;  Location[facility] INVASIVE CV LAB;  Service: Cardiovascular;  Laterality: N/A;   CATARACT EXTRACTION W/ INTRAOCULAR LENS  IMPLANT, BILATERAL Bilateral 2006-2011   CESAREAN SECTION  1983   CHOLECYSTECTOMY  ~  2002   COLONOSCOPY N/A 01/21/2013   Procedure: COLONOSCOPY;  Surgeon: Belvie JONETTA Just, MD;  Location: [facility] ENDOSCOPY;  Service: Endoscopy;  Laterality: N/A;   EYE SURGERY Bilateral    multiple (01/18/2013)   INCISION AND DRAINAGE OF WOUND Right 12/01/2023   Procedure: IRRIGATION AND DEBRIDEMENT WOUND;  Surgeon: Desiderio Schanz, MD;  Location: [facility] ORS;  Service: General;  Laterality: Right;   PACEMAKER IMPLANT N/A 08/31/2018   Symptomatic bradycardia due to mobitz II second degree AV block, permanent afib/ atypical atrial flutter implanted by Dr Kelsie PRESCOTT PLANA  REPAIR OF RETINAL DEATACHMENT Right    PARS PLANA VITRECTOMY Bilateral 2004-2006   several (01/18/2013)   REFRACTIVE SURGERY Bilateral    for stigmatism (01/18/2013)   REFRACTIVE SURGERY Left ~ 11/2012   to puff it up cause vision got hazy (01/18/2013)   RIGHT HEART CATH N/A 12/30/2018   Procedure: RIGHT HEART CATH;  Surgeon: Cherrie Toribio SAUNDERS, MD;  Location: [facility] INVASIVE CV LAB;  Service: Cardiovascular;  Laterality: N/A;   RIGHT/LEFT HEART CATH AND CORONARY ANGIOGRAPHY N/A 09/07/2023   Procedure: RIGHT/LEFT HEART CATH AND CORONARY ANGIOGRAPHY;  Surgeon: Wendel Lurena POUR, MD;  Location: [facility] INVASIVE CV LAB;  Service: Cardiovascular;  Laterality: N/A;   VENA CAVA FILTER PLACEMENT  2011?   Social History:  reports that she quit smoking about 35 years ago. Her smoking use included cigarettes. She started smoking about 65 years ago. She has a 1.5 pack-year smoking history. She has never used smokeless tobacco. She reports that she does not currently use alcohol. She reports that she does not use drugs.  Allergies  Allergen Reactions   Ms Contin [Morphine] Hives, Rash and Other (See Comments)    Broke out in brown spots all over.     Family History  Problem Relation Age of Onset   Cerebral aneurysm Mother    Hypertension Father    Cerebral aneurysm Maternal Grandfather    Cerebral aneurysm Maternal Aunt    Cancer Maternal Uncle     Prior to Admission medications   Medication Sig Start Date End Date Taking? Authorizing Provider  allopurinol  (ZYLOPRIM ) 100 MG tablet Take 50 mg by mouth every other day. 10/28/23   [provider]  amLODipine  (NORVASC ) 5 MG tablet Take 2 tablets (10 mg total) by mouth daily. 10/26/23   Milford, Harlene HERO, FNP  apixaban  (ELIQUIS ) 5 MG TABS tablet Take 5 mg by mouth 2 (two) times daily.    [provider]  ARIPiprazole  (ABILIFY ) 2 MG tablet Take 1 tablet (2 mg total) by mouth at bedtime. 12/06/23   Jens Durand, MD  citalopram  (CELEXA ) 40  MG tablet Take 40 mg by mouth at bedtime. 10/28/23   [provider]  diazepam  (VALIUM ) 5 MG tablet Take 1 tablet (5 mg total) by mouth 2 (two) times daily as needed for anxiety or muscle spasms. 12/06/23   Jens Durand, MD  insulin  aspart (NOVOLOG ) 100 UNIT/ML injection Inject 5 Units into the skin 3 (three) times daily with meals. 12/06/23   Jens Durand, MD  insulin  glargine (LANTUS  SOLOSTAR) 100 UNIT/ML Solostar Pen Inject 20 Units into the skin daily.    [provider]  iron  polysaccharides (NIFEREX) 150 MG capsule Take 1 capsule (150 mg total) by mouth daily. 12/07/23   Jens Durand, MD  levothyroxine  (SYNTHROID ) 100 MCG tablet Take 1 tablet (100 mcg total) by mouth daily before breakfast. 12/07/23   Jens Durand, MD  Melatonin 10 MG TABS Take 10  mg by mouth.    [provider]  Multiple Vitamins-Minerals (CENTRUM SILVER) CHEW Chew 2 each by mouth daily.    [provider]  rosuvastatin  (CRESTOR ) 10 MG tablet Take 1 tablet (10 mg total) by mouth daily. 05/09/16   Epifanio Rolland Robin, MD    Physical Exam: Vitals:   12/16/23 0700 12/16/23 0722 12/16/23 0745 12/16/23 0746  BP: (!) 135/51     Pulse: (!) 50  (!) 50 (!) 50  Resp: 16  16 18   Temp: 98.1 F (36.7 C)     TempSrc: Oral     SpO2: 100% 100% 100% 100%  Weight:      Height:       General: Alert, oriented x3, resting comfortably in no acute distress HEENT: EOMI, oropharynx clear, moist mucous membranes, hearing intact Neck: Trachea midline and no gross thyromegaly Respiratory: Lungs clear to auscultation bilaterally with normal respiratory effort; no w/r/r Cardiovascular: Regular rate and rhythm w/o m/r/g Abdomen: Soft, nontender, nondistended. Positive bowel sounds MSK: No obvious joint deformities or swelling Skin: No obvious rashes or lesions Neurologic: Awake, alert, spontaneously moves all extremities, strength intact Psychiatric: Appropriate mood and affect, conversational and  cooperative  Data Reviewed:  Lab Results  Component Value Date   WBC 8.1 12/15/2023   HGB 8.7 (L) 12/15/2023   HCT 29.8 (L) 12/15/2023   MCV 104.6 (H) 12/15/2023   PLT 154 12/15/2023   Lab Results  Component Value Date   GLUCOSE 225 (H) 12/15/2023   CALCIUM  8.6 (L) 12/15/2023   NA 136 12/15/2023   K 4.6 12/15/2023   CO2 24 12/15/2023   CL 98 12/15/2023   BUN 135 (H) 12/15/2023   CREATININE 3.65 (H) 12/15/2023   Lab Results  Component Value Date   ALT 16 12/15/2023   AST 23 12/15/2023   ALKPHOS 79 12/15/2023   BILITOT 0.5 12/15/2023   Lab Results  Component Value Date   INR 1.3 (H) 09/07/2023   INR 1.4 (H) 09/06/2023   INR 1.7 (H) 09/05/2023   PROTIME 22.8 (H) 03/27/2016   PROTIME 25.2 (H) 01/31/2016   PROTIME 22.8 (H) 01/08/2016   Radiology: CT Renal Stone Study Result Date: 12/15/2023 EXAM: CT UROGRAM 12/15/2023 11:01:37 PM TECHNIQUE: CT of the abdomen and pelvis was performed before and after the administration of intravenous contrast as per CT urogram protocol. Multiplanar reformatted images as well as MIP urogram images are provided for review. Automated exposure control, iterative reconstruction, and/or weight based adjustment of the mA/kV was utilized to reduce the radiation dose to as low as reasonably achievable. COMPARISON: CT 12/25/2012 CLINICAL HISTORY: Abdominal/flank pain, stone suspected. FINDINGS: LOWER CHEST: Small right pleural effusions. Right lower lobe airspace opacity suspicious for pneumonia. LIVER: Multiple hypoattenuating lesions throughout the liver were not previously seen and are concerning for metastases. These are incompletely assessed without IV contrast. The largest measures approximately 7.7 cm in the inferior right hepatic lobe (series 3, image 36). GALLBLADDER AND BILE DUCTS: Cholecystectomy. SPLEEN: No acute abnormality. PANCREAS: No acute abnormality. ADRENAL GLANDS: No acute abnormality. KIDNEYS, URETERS AND BLADDER: Nonobstructing left  nephrolithiasis. No hydronephrosis. No perinephric or periureteral stranding. Urinary bladder is unremarkable. GI AND BOWEL: Stomach demonstrates no acute abnormality. There is no bowel obstruction. PERITONEUM AND RETROPERITONEUM: No ascites. No free air. VASCULATURE: Similar configuration of the IVC filter. Aortic atherosclerotic calcification. LYMPH NODES: No lymphadenopathy. REPRODUCTIVE ORGANS: No acute abnormality. BONES AND SOFT TISSUES: No acute osseous abnormality. Diffuse edema in the bilateral flanks and anterior abdominal  wall pannus. IMPRESSION: 1. Multiple hypoattenuating liver lesions concerning for metastases, incompletely assessed without IV contrast. Largest measures approximately 7.7 cm in the inferior right hepatic lobe. Further evaluation with CT with IV contrast is recommended. 2. Right lower lobe airspace opacity suspicious for pneumonia. 3. Nonobstructing left nephrolithiasis. No hydronephrosis. Electronically signed by: Norman Gatlin MD 12/15/2023 11:10 PM EDT RP Workstation: [id number]   DG Chest 2 View Result Date: 12/15/2023 CLINICAL DATA:  Chest pain. EXAM: CHEST - 2 VIEW COMPARISON:  Chest radiograph dated 11/11/2023 FINDINGS: There is cardiomegaly with vascular congestion. No focal consolidation, pleural effusion or pneumothorax. Left pectoral pacemaker device. Atherosclerotic calcification of the aorta. No acute osseous pathology. IMPRESSION: Cardiomegaly with vascular congestion.  Pneumonia is not excluded. Electronically Signed   By: Vanetta Chou M.D.   On: 12/15/2023 21:38    Assessment and Plan: 55F h/o pulmonary hypertension, OSA/OHS (intolerant to CPAP), chronic respiratory failure requiring 2L [place] at baseline, Afib on Eliquis , HFpEF (EF 60-65% in 08/2023), DM2 (A1c 5.7 in 08/2023), DVT/PE x3 s/p IVC filter in 09/2009, CKD stage IV, morbid obesity, MDD, and recent admission for suicide attempt in 10/2023 p/w SOB/cp iso volume overload 2/2 LLL CAP and HFpEF exacerbation c/b  worsening renal failure and found to have multiple liver lesions c/f metastases.  Respiratory insufficiency iso volume overload 2/2 LLL CAP and HFpEF exacerbation c/b worsening renal failure LLL CAP SOB Chest pain H/o pulmonary HTN (group 3 suspected) H/o morbid obesity Multifactorial SOB/cp but likely exacerbated by volume overload iso worsening renal failure -Renal consulted to eval for HD; apprec eval/recs -IV CTX 1g daily to complete 5 day CAP course -PO azithromycin  500mg  daily to complete 3 day CAP course -IV lasix  80mg  BID for now; goal net neg 1-2L/d; strict I/Os; daily standing weights; K>4/Mg>2 -Duonebs prn -Wean O2 as tolerated -F/u urine and blood cultures -Ambulatory pulse ox prior to d/c -Renal diet with fluid restriction  AKI on CKD (chronic kidney disease) stage 4, GFR 15-29 ml/min Creatinine slightly up from 3.15-3.19 is basically unchanged. -Renal consulted; apprec eval/recs -F/u urine culture  HFpEF Recent R/LHC noted no significant CAD, PA pressure 62/16, wedge of 11, PVR 4.4, group 3 pulmonary hypertension suspected  -IV diuresis per above  Liver lesions c/f metastases -IR consulted for bx; apprec eval/recs  Paroxysmal Afib -PTA Eliquis  5mg  BID  Hypothyroid -PTA Synthroid   HTN -PTA amlodipine   DM2 -PTA long acting insulin  20U daily and SSI TID AC prn  Chronic lymphedema Venous stasis ulcer of LLE -Continue wrapping BLE and Unna boots to BLE as well  MDD -PTA Abilify , Celexa , and Valium    Advance Care Planning:   Code Status: Limited: Do not attempt resuscitation (DNR) -DNR-LIMITED -Do Not Intubate/DNI    Consults: Renal  Family Communication: N/A  Severity of Illness: The appropriate patient status for this patient is INPATIENT. Inpatient status is judged to be reasonable and necessary in order to provide the required intensity of service to ensure the patient's safety. The patient's presenting symptoms, physical exam findings, and  initial radiographic and laboratory data in the context of their chronic comorbidities is felt to place them at high risk for further clinical deterioration. Furthermore, it is not anticipated that the patient will be medically stable for discharge from the hospital within 2 midnights of admission.   * I certify that at the point of admission it is my clinical judgment that the patient will require inpatient hospital care spanning beyond 2 midnights from the point of admission due to  high intensity of service, high risk for further deterioration and high frequency of surveillance required.*   ------- I spent 60 minutes reviewing previous notes, at the bedside counseling/discussing the treatment plan, and performing clinical documentation.  Author: Marsha Ada, MD 12/16/2023 8:32 AM  For on call review [email or web address].

## 2023-12-16 NOTE — ED Notes (Signed)
Provided pt with juice. 

## 2023-12-16 NOTE — Progress Notes (Signed)
 Patient arrives to 5W02 at this time from ED.

## 2023-12-16 NOTE — ED Provider Notes (Signed)
 I assumed care at signout from Dr. Dreama.  Plan was to admit to the medical service for pneumonia, worsening renal function as well as found to have liver mets of unclear primary She has discussed this all with the patient and her family  Discussed with Dr. Franky for admission   Midge Golas, MD 12/16/23 [phone]

## 2023-12-16 NOTE — Consult Note (Addendum)
 [facility] Nephrology Consultation Note  Requesting MD: Dr. Georgina, Marsha Reason for consult: AKI on CKD   HPI:  [name] is a 68 y.o. female with past medical history significant for hypertension, type 2 diabetes with neuropathy, morbid obesity, chronic respiratory failure on home oxygen, A-fib on Eliquis , CHF with preserved EF, DVT PE, recently progressive CKD to stage IV who was presented with shortness of breath and chest pain seen as a consultation for evaluation of AKI on CKD and fluid overload. The patient was recently hospitalized for acute on chronic CHF treated with IV diuretics however developed AKI on CKD with creatinine level peaked around 3.6.  The patient was seen by nephrologist team at [facility].  Reportedly the diuretics were held on discharge because of AKI.  The patient was discharged to SNF where she had gradual worsening shortness of breath associated with weight gain, chest pain and overall not feeling well.  During recent hospitalization she had right axillary abscess status post I&D and initially treated with Bactrim  but later switched to doxycycline  because of AKI. For CKD, she was followed by Dr. Dennise at [facility], last seen in 06/2023 and then lost follow-up.  It seems like her creatinine was 1.8 at that time.  The CKD was thought to be due to diabetic nephropathy.  The kidney ultrasound in 03/2023 showed hypoechoic mass in left kidney.  The patient was subsequently referred to urologist and was [facility].  Unable to get MRI because of pacemaker. In the ER, patient was afebrile, hypoxic requiring around 2 to 3 L of oxygen.  Blood pressure mostly acceptable.  Chest x-ray with cardiomegaly, vascular congestion and possible pneumonia.  Subsequently the patient underwent CT scan of kidneys which showed nonobstructive left nephrolithiasis, no hydronephrosis.  However multiple hypoattenuating liver lesions seen. The labs showed  potassium 4.6, BUN 135, creatinine level 3.65, elevated BNP, troponin.  Hemoglobin is 8.7. The patient stated that she had lab done at her SNF yesterday when she was told that she will need dialysis therefore sent to the ER. Patient is open to receive dialysis if needed.  She was started on IV Lasix  today.   PMHx:   Past Medical History:  Diagnosis Date   A-fib (HCC)    Anemia    Anticoagulated on Coumadin , chronically 09/03/2011   Anxiety    Arthritis    right hip; both knees; left wrist/shoulder; back (01/19/2013   Bleeding on Coumadin  08/2012; 01/18/2013   [facility] admissions (01/19/2013)   CHF (congestive heart failure) (HCC)    2-3 times (01/19/2013)   Chronic lower back pain    Depression    DVT (deep venous thrombosis) (HCC) 10 years ago   numerous/notes 01/18/2013   GERD (gastroesophageal reflux disease)    Gout    Headache(784.0)    maybe weekly (01/19/2013)   Heart murmur    High cholesterol    been off RX for this at one time (01/18/2013)   History of blood transfusion 1983; 04/2012   3 w/ childbirth; hospitalized for pain (01/19/2013)   Hypertension    Hypothyroidism    Migraines    twice/yr maybe (01/19/2013)   Obstructive sleep apnea 05/03/2012   OSA (obstructive sleep apnea)    sent me for test in 04/2012; never ordered mask, etc (01/19/2013)   PE (pulmonary thromboembolism) (HCC) 3 years ag0   3/notes 01/18/2013   Pneumonia before 2011   once' (01/18/2013)   Renal disorder    kindey  function low; Metformin was destroying my kidneys (01/19/2013)   Shortness of breath    only related to my CHF (01/18/2013)   Swelling of hand 08/31/2014   RT HAND   Type II diabetes mellitus (HCC)    UTI (urinary tract infection) 08/31/2014    Past Surgical History:  Procedure Laterality Date   CARDIAC CATHETERIZATION N/A 03/13/2015   Procedure: Right/Left Heart Cath and Coronary Angiography;  Surgeon: Gordy Bergamo, MD;  Location: [facility] INVASIVE CV LAB;  Service:  Cardiovascular;  Laterality: N/A;   CATARACT EXTRACTION W/ INTRAOCULAR LENS  IMPLANT, BILATERAL Bilateral 2006-2011   CESAREAN SECTION  1983   CHOLECYSTECTOMY  ~ 2002   COLONOSCOPY N/A 01/21/2013   Procedure: COLONOSCOPY;  Surgeon: Belvie JONETTA Just, MD;  Location: [facility] ENDOSCOPY;  Service: Endoscopy;  Laterality: N/A;   EYE SURGERY Bilateral    multiple (01/18/2013)   INCISION AND DRAINAGE OF WOUND Right 12/01/2023   Procedure: IRRIGATION AND DEBRIDEMENT WOUND;  Surgeon: Desiderio Schanz, MD;  Location: [facility] ORS;  Service: General;  Laterality: Right;   PACEMAKER IMPLANT N/A 08/31/2018   Symptomatic bradycardia due to mobitz II second degree AV block, permanent afib/ atypical atrial flutter implanted by Dr Kelsie PRESCOTT PLANA REPAIR OF RETINAL DEATACHMENT Right    PARS PLANA VITRECTOMY Bilateral 2004-2006   several (01/18/2013)   REFRACTIVE SURGERY Bilateral    for stigmatism (01/18/2013)   REFRACTIVE SURGERY Left ~ 11/2012   to puff it up cause vision got hazy (01/18/2013)   RIGHT HEART CATH N/A 12/30/2018   Procedure: RIGHT HEART CATH;  Surgeon: Cherrie Toribio SAUNDERS, MD;  Location: [facility] INVASIVE CV LAB;  Service: Cardiovascular;  Laterality: N/A;   RIGHT/LEFT HEART CATH AND CORONARY ANGIOGRAPHY N/A 09/07/2023   Procedure: RIGHT/LEFT HEART CATH AND CORONARY ANGIOGRAPHY;  Surgeon: Wendel Lurena POUR, MD;  Location: [facility] INVASIVE CV LAB;  Service: Cardiovascular;  Laterality: N/A;   VENA CAVA FILTER PLACEMENT  2011?    Family Hx:  Family History  Problem Relation Age of Onset   Cerebral aneurysm Mother    Hypertension Father    Cerebral aneurysm Maternal Grandfather    Cerebral aneurysm Maternal Aunt    Cancer Maternal Uncle     Social History:  reports that she quit smoking about 35 years ago. Her smoking use included cigarettes. She started smoking about 65 years ago. She has a 1.5 pack-year smoking history. She has never used smokeless tobacco. She reports that she does not currently use  alcohol. She reports that she does not use drugs.  Allergies:  Allergies  Allergen Reactions   Ms Contin [Morphine] Hives, Rash and Other (See Comments)    Broke out in brown spots all over.     Medications: Prior to Admission medications   Medication Sig Start Date End Date Taking? Authorizing Provider  allopurinol  (ZYLOPRIM ) 100 MG tablet Take 50 mg by mouth every other day. 10/28/23   [provider]  amLODipine  (NORVASC ) 5 MG tablet Take 2 tablets (10 mg total) by mouth daily. 10/26/23   Milford, Harlene HERO, FNP  apixaban  (ELIQUIS ) 5 MG TABS tablet Take 5 mg by mouth 2 (two) times daily.    [provider]  ARIPiprazole  (ABILIFY ) 2 MG tablet Take 1 tablet (2 mg total) by mouth at bedtime. 12/06/23   Jens Durand, MD  citalopram  (CELEXA ) 40 MG tablet Take 40 mg by mouth at bedtime. 10/28/23   [provider]  diazepam  (VALIUM ) 5 MG tablet Take 1 tablet (5 mg  total) by mouth 2 (two) times daily as needed for anxiety or muscle spasms. 12/06/23   Jens Durand, MD  insulin  aspart (NOVOLOG ) 100 UNIT/ML injection Inject 5 Units into the skin 3 (three) times daily with meals. 12/06/23   Jens Durand, MD  insulin  glargine (LANTUS  SOLOSTAR) 100 UNIT/ML Solostar Pen Inject 20 Units into the skin daily.    [provider]  iron  polysaccharides (NIFEREX) 150 MG capsule Take 1 capsule (150 mg total) by mouth daily. 12/07/23   Jens Durand, MD  levothyroxine  (SYNTHROID ) 100 MCG tablet Take 1 tablet (100 mcg total) by mouth daily before breakfast. 12/07/23   Jens Durand, MD  Melatonin 10 MG TABS Take 10 mg by mouth.    [provider]  Multiple Vitamins-Minerals (CENTRUM SILVER) CHEW Chew 2 each by mouth daily.    [provider]  rosuvastatin  (CRESTOR ) 10 MG tablet Take 1 tablet (10 mg total) by mouth daily. 05/09/16   Epifanio Rolland Robin, MD    I have reviewed the patient's current medications.  Labs: Renal Panel: Recent Labs  Lab  12/15/23 2054  NA 136  K 4.6  CL 98  CO2 24  GLUCOSE 225*  BUN 135*  CREATININE 3.65*  CALCIUM  8.6*     CBC:    Latest Ref Rng & Units 12/15/2023    8:54 PM 12/02/2023    5:45 AM 11/29/2023    8:57 AM  CBC  WBC 4.0 - 10.5 K/uL 8.1  7.3  7.6   Hemoglobin 12.0 - 15.0 g/dL 8.7  8.2  8.0   Hematocrit 36.0 - 46.0 % 29.8  27.0  26.8   Platelets 150 - 400 K/uL 154  157  127      Anemia Panel:  Recent Labs    11/17/23 1113 11/18/23 1050 11/25/23 0630 11/26/23 0355 11/29/23 0857 12/02/23 0545 12/15/23 2054  HGB  --    < > 7.9* 8.1* 8.0* 8.2* 8.7*  MCV  --    < > 105.1* 105.1* 103.1* 100.7* 104.6*  VITAMINB12 480  --   --   --   --   --   --   TIBC 245*  --   --   --   --   --   --   IRON  33  --   --   --   --   --   --    < > = values in this interval not displayed.    Recent Labs  Lab 12/15/23 2054  AST 23  ALT 16  ALKPHOS 79  BILITOT 0.5  PROT 5.7*  ALBUMIN  3.1*    Lab Results  Component Value Date   HGBA1C 5.7 (H) 09/04/2023    ROS:  Pertinent items noted in HPI and remainder of comprehensive ROS otherwise negative.  Physical Exam: Vitals:   12/16/23 1345 12/16/23 1400  BP: (!) 134/47 (!) 139/45  Pulse: (!) 50 (!) 50  Resp: 19 16  Temp:    SpO2:       General exam: Appears calm and comfortable  Respiratory system: Distant breath sound bilateral. Cardiovascular system: S1 & S2 heard, RRR.  Bilateral pedal edema presents Gastrointestinal system: Abdomen is nondistended, soft and tender. Normal bowel sounds heard. Central nervous system: Alert and oriented. No focal neurological deficits. Extremities: Bilateral lower extremity edema++ Skin: No rashes, lesions or ulcers Psychiatry: Judgement and insight appear normal. Mood & affect appropriate.   Assessment/Plan:  # Acute kidney injury on CKD stage IV likely  due to reduced effective renal perfusion caused by CHF and possibly due to infection in recent prolonged hospitalization versus progressive  CKD.   CKD was thought to be due to diabetic nephropathy and was followed by Dr. Dennise at [facility], last seen in 06/2023.  Since then patient had hospitalization at [facility] related with abscess, CHF, AKI on CKD.  Now presents with fluid overload in the setting of withholding diuretics. - Agree with continuing IV Lasix  80 mg twice daily.  Patient agreed to receive dialysis when needed.  Fortunately, no need for dialysis at this time however, we will continue to monitor urine output, labs closely.  # Chronic hypoxic respiratory failure due to pneumonia, CHF: Currently on antibiotics and diuretics.  # CHF exacerbation with preserved EF: Recent echo reviewed with EF 60 to 65%.  Cath with nonobstructive CAD.  # Liver lesions consistent with metastasis: IR consulted for biopsy/drainage.  # Anemia: Check iron  level, hold erythropoietin  while undergoing evaluation for possible liver metastasis.  Thank you for the consult, we will continue to follow.  Arabela Basaldua [name] 12/16/2023, 2:47 PM  [facility].

## 2023-12-16 NOTE — ED Notes (Signed)
 Messaged provider for nausea and pain medications

## 2023-12-16 NOTE — ED Notes (Signed)
 Changed pt entire bed, brief chucks. Wiped down with warm wash cloth. Adjusted in bed and provided warm blanket X2

## 2023-12-16 NOTE — ED Notes (Addendum)
 Md at bedside pt and all belongings sent upstairs

## 2023-12-16 NOTE — Progress Notes (Signed)
 Dressings to BLE changed at this time.  BLE reddened with several areas weeping serous fluid.  ABD pads applied, secured with kerlix and ACE wrap.  Wound care consult ordered.

## 2023-12-16 NOTE — ED Notes (Addendum)
 Attempted to do in and out cath x3 with 3 different nurses unsuccessful. MD made aware.

## 2023-12-16 NOTE — ED Notes (Addendum)
 Messaged provider X2 request prn pain and nausea meds for pt

## 2023-12-16 NOTE — Plan of Care (Signed)

## 2023-12-17 DIAGNOSIS — E1140 Type 2 diabetes mellitus with diabetic neuropathy, unspecified: Secondary | ICD-10-CM | POA: Diagnosis present

## 2023-12-17 DIAGNOSIS — I4821 Permanent atrial fibrillation: Secondary | ICD-10-CM | POA: Diagnosis present

## 2023-12-17 DIAGNOSIS — K769 Liver disease, unspecified: Secondary | ICD-10-CM | POA: Diagnosis not present

## 2023-12-17 DIAGNOSIS — L97929 Non-pressure chronic ulcer of unspecified part of left lower leg with unspecified severity: Secondary | ICD-10-CM | POA: Diagnosis present

## 2023-12-17 DIAGNOSIS — Z789 Other specified health status: Secondary | ICD-10-CM | POA: Diagnosis not present

## 2023-12-17 DIAGNOSIS — N179 Acute kidney failure, unspecified: Secondary | ICD-10-CM | POA: Diagnosis present

## 2023-12-17 DIAGNOSIS — E039 Hypothyroidism, unspecified: Secondary | ICD-10-CM | POA: Diagnosis present

## 2023-12-17 DIAGNOSIS — I5033 Acute on chronic diastolic (congestive) heart failure: Secondary | ICD-10-CM | POA: Diagnosis present

## 2023-12-17 DIAGNOSIS — D631 Anemia in chronic kidney disease: Secondary | ICD-10-CM | POA: Diagnosis present

## 2023-12-17 DIAGNOSIS — I130 Hypertensive heart and chronic kidney disease with heart failure and stage 1 through stage 4 chronic kidney disease, or unspecified chronic kidney disease: Secondary | ICD-10-CM | POA: Diagnosis present

## 2023-12-17 DIAGNOSIS — C221 Intrahepatic bile duct carcinoma: Secondary | ICD-10-CM | POA: Diagnosis present

## 2023-12-17 DIAGNOSIS — Z7189 Other specified counseling: Secondary | ICD-10-CM | POA: Diagnosis not present

## 2023-12-17 DIAGNOSIS — N390 Urinary tract infection, site not specified: Secondary | ICD-10-CM | POA: Diagnosis present

## 2023-12-17 DIAGNOSIS — Z95828 Presence of other vascular implants and grafts: Secondary | ICD-10-CM | POA: Diagnosis not present

## 2023-12-17 DIAGNOSIS — Z6841 Body Mass Index (BMI) 40.0 and over, adult: Secondary | ICD-10-CM | POA: Diagnosis not present

## 2023-12-17 DIAGNOSIS — N185 Chronic kidney disease, stage 5: Secondary | ICD-10-CM | POA: Diagnosis not present

## 2023-12-17 DIAGNOSIS — J9611 Chronic respiratory failure with hypoxia: Secondary | ICD-10-CM | POA: Diagnosis present

## 2023-12-17 DIAGNOSIS — Z992 Dependence on renal dialysis: Secondary | ICD-10-CM | POA: Diagnosis not present

## 2023-12-17 DIAGNOSIS — Z515 Encounter for palliative care: Secondary | ICD-10-CM | POA: Diagnosis not present

## 2023-12-17 DIAGNOSIS — J189 Pneumonia, unspecified organism: Secondary | ICD-10-CM | POA: Diagnosis present

## 2023-12-17 DIAGNOSIS — E66813 Obesity, class 3: Secondary | ICD-10-CM | POA: Diagnosis present

## 2023-12-17 DIAGNOSIS — B9689 Other specified bacterial agents as the cause of diseases classified elsewhere: Secondary | ICD-10-CM | POA: Diagnosis present

## 2023-12-17 DIAGNOSIS — Z66 Do not resuscitate: Secondary | ICD-10-CM | POA: Diagnosis present

## 2023-12-17 DIAGNOSIS — R627 Adult failure to thrive: Secondary | ICD-10-CM | POA: Diagnosis present

## 2023-12-17 DIAGNOSIS — N184 Chronic kidney disease, stage 4 (severe): Secondary | ICD-10-CM | POA: Diagnosis present

## 2023-12-17 DIAGNOSIS — Z794 Long term (current) use of insulin: Secondary | ICD-10-CM | POA: Diagnosis not present

## 2023-12-17 DIAGNOSIS — F329 Major depressive disorder, single episode, unspecified: Secondary | ICD-10-CM | POA: Diagnosis present

## 2023-12-17 DIAGNOSIS — R7989 Other specified abnormal findings of blood chemistry: Secondary | ICD-10-CM | POA: Diagnosis not present

## 2023-12-17 DIAGNOSIS — E1122 Type 2 diabetes mellitus with diabetic chronic kidney disease: Secondary | ICD-10-CM | POA: Diagnosis present

## 2023-12-17 DIAGNOSIS — C787 Secondary malignant neoplasm of liver and intrahepatic bile duct: Secondary | ICD-10-CM | POA: Diagnosis not present

## 2023-12-17 LAB — APTT: aPTT: 75 s — ABNORMAL HIGH (ref 24–36)

## 2023-12-17 LAB — IRON AND TIBC
Iron: 22 ug/dL — ABNORMAL LOW (ref 28–170)
Saturation Ratios: 9 % — ABNORMAL LOW (ref 10.4–31.8)
TIBC: 245 ug/dL — ABNORMAL LOW (ref 250–450)
UIBC: 223 ug/dL

## 2023-12-17 LAB — RENAL FUNCTION PANEL
Albumin: 2.7 g/dL — ABNORMAL LOW (ref 3.5–5.0)
Anion gap: 12 (ref 5–15)
BUN: 135 mg/dL — ABNORMAL HIGH (ref 8–23)
CO2: 25 mmol/L (ref 22–32)
Calcium: 8.4 mg/dL — ABNORMAL LOW (ref 8.9–10.3)
Chloride: 100 mmol/L (ref 98–111)
Creatinine, Ser: 3.38 mg/dL — ABNORMAL HIGH (ref 0.44–1.00)
GFR, Estimated: 14 mL/min — ABNORMAL LOW (ref >60)
Glucose, Bld: 125 mg/dL — ABNORMAL HIGH (ref 70–99)
Phosphorus: 5.5 mg/dL — ABNORMAL HIGH (ref 2.5–4.6)
Potassium: 4.9 mmol/L (ref 3.5–5.1)
Sodium: 137 mmol/L (ref 135–145)

## 2023-12-17 LAB — GLUCOSE, CAPILLARY
Glucose-Capillary: 118 mg/dL — ABNORMAL HIGH (ref 70–99)
Glucose-Capillary: 153 mg/dL — ABNORMAL HIGH (ref 70–99)
Glucose-Capillary: 213 mg/dL — ABNORMAL HIGH (ref 70–99)
Glucose-Capillary: 144 mg/dL — ABNORMAL HIGH (ref 70–99)

## 2023-12-17 LAB — TYPE AND SCREEN
ABO/RH(D): A POS
Antibody Screen: NEGATIVE

## 2023-12-17 LAB — PROTIME-INR
INR: 2.1 — ABNORMAL HIGH (ref 0.8–1.2)
Prothrombin Time: 24.6 s — ABNORMAL HIGH (ref 11.4–15.2)

## 2023-12-17 LAB — FERRITIN: Ferritin: 115 ng/mL (ref 11–307)

## 2023-12-17 LAB — HEPARIN LEVEL (UNFRACTIONATED): Heparin Unfractionated: >1.1 [IU]/mL — ABNORMAL HIGH (ref 0.30–0.70)

## 2023-12-17 MED ORDER — CITALOPRAM HYDROBROMIDE 20 MG PO TABS
20.0000 mg | ORAL_TABLET | Freq: Every day | ORAL | Status: DC
  Administered 2023-12-17 – 2023-12-30 (×14): 20 mg via ORAL
  Filled 2023-12-17 (×14): qty 1

## 2023-12-17 MED ORDER — SODIUM CHLORIDE 0.9 % IV SOLN
12.5000 mg | Freq: Three times a day (TID) | INTRAVENOUS | Status: DC | PRN
  Administered 2023-12-17 – 2023-12-19 (×2): 12.5 mg via INTRAVENOUS
  Filled 2023-12-17 (×2): qty 12.5

## 2023-12-17 MED ORDER — IRON SUCROSE 200 MG IVPB - SIMPLE MED
200.0000 mg | Status: AC
  Administered 2023-12-17 – 2023-12-18 (×2): 200 mg via INTRAVENOUS
  Filled 2023-12-17 (×2): qty 200

## 2023-12-17 MED ORDER — FUROSEMIDE 10 MG/ML IJ SOLN
80.0000 mg | Freq: Two times a day (BID) | INTRAMUSCULAR | Status: DC
  Administered 2023-12-17 – 2023-12-24 (×15): 80 mg via INTRAVENOUS
  Filled 2023-12-17 (×14): qty 8

## 2023-12-17 MED ORDER — HEPARIN (PORCINE) 25000 UT/250ML-% IV SOLN
1100.0000 [IU]/h | INTRAVENOUS | Status: DC
  Administered 2023-12-17 – 2023-12-19 (×3): 1200 [IU]/h via INTRAVENOUS
  Administered 2023-12-20 – 2023-12-22 (×3): 1100 [IU]/h via INTRAVENOUS
  Filled 2023-12-17 (×6): qty 250

## 2023-12-17 MED ORDER — METOLAZONE 5 MG PO TABS
10.0000 mg | ORAL_TABLET | Freq: Once | ORAL | Status: AC
  Administered 2023-12-17: 10 mg via ORAL
  Filled 2023-12-17: qty 2

## 2023-12-17 NOTE — Consult Note (Signed)
 Chief Complaint:  Liver lesion  Procedure: CT Liver Biopsy  Referring Provider(s): Dr. Marsha Ada  Supervising Physician: Jennefer Rover  Patient Status: Surgery By [facility] - In-pt  History of Present Illness: [name] is a 68 y.o. female with a history of Afib on Eliquis , Heart failure, DVT/PE s/p IVC filter in 2011, CKD stage IV, and morbid obesity who presented to the ED with shortness of breath and volume overload. CT Renal study ordered for left flank pain which revealed hypoattenuating lesions throughout the liver concerning for metastases. Patient unable to undergo MR due to pacemaker so CT Liver completed which confirmed liver lesions with the largest measuring 7.9cm in the inferior right hepatic lobe. IR consulted for possible liver biopsy while patient remains in the hospital. Case and images reviewed and approved by Dr. JONETTA Jennefer.  Patient resting in bed eating lunch on arrival to room. States that she continues to have extreme fatigue, dyspnea on exertion, intermittent nausea, and some epigastric pain. She denies any chest pain, fevers/chills, or RUQ pain. All questions and concerns answered at the bedside.   Patient is Full Code for procedure  Past Medical History:  Diagnosis Date   A-fib (HCC)    Anemia    Anticoagulated on Coumadin , chronically 09/03/2011   Anxiety    Arthritis    right hip; both knees; left wrist/shoulder; back (01/19/2013   Bleeding on Coumadin  08/2012; 01/18/2013   [facility] admissions (01/19/2013)   CHF (congestive heart failure) (HCC)    2-3 times (01/19/2013)   Chronic lower back pain    Depression    DVT (deep venous thrombosis) (HCC) 10 years ago   numerous/notes 01/18/2013   GERD (gastroesophageal reflux disease)    Gout    Headache(784.0)    maybe weekly (01/19/2013)   Heart murmur    High cholesterol    been off RX for this at one time (01/18/2013)   History of blood transfusion 1983; 04/2012   3 w/ childbirth; hospitalized for  pain (01/19/2013)   Hypertension    Hypothyroidism    Migraines    twice/yr maybe (01/19/2013)   Obstructive sleep apnea 05/03/2012   OSA (obstructive sleep apnea)    sent me for test in 04/2012; never ordered mask, etc (01/19/2013)   PE (pulmonary thromboembolism) (HCC) 3 years ag0   3/notes 01/18/2013   Pneumonia before 2011   once' (01/18/2013)   Renal disorder    kindey function low; Metformin was destroying my kidneys (01/19/2013)   Shortness of breath    only related to my CHF (01/18/2013)   Swelling of hand 08/31/2014   RT HAND   Type II diabetes mellitus (HCC)    UTI (urinary tract infection) 08/31/2014    Past Surgical History:  Procedure Laterality Date   CARDIAC CATHETERIZATION N/A 03/13/2015   Procedure: Right/Left Heart Cath and Coronary Angiography;  Surgeon: Gordy Bergamo, MD;  Location: [facility] INVASIVE CV LAB;  Service: Cardiovascular;  Laterality: N/A;   CATARACT EXTRACTION W/ INTRAOCULAR LENS  IMPLANT, BILATERAL Bilateral 2006-2011   CESAREAN SECTION  1983   CHOLECYSTECTOMY  ~ 2002   COLONOSCOPY N/A 01/21/2013   Procedure: COLONOSCOPY;  Surgeon: Belvie JONETTA Just, MD;  Location: [facility] ENDOSCOPY;  Service: Endoscopy;  Laterality: N/A;   EYE SURGERY Bilateral    multiple (01/18/2013)   INCISION AND DRAINAGE OF WOUND Right 12/01/2023   Procedure: IRRIGATION AND DEBRIDEMENT WOUND;  Surgeon: Desiderio Schanz, MD;  Location: [facility] ORS;  Service: General;  Laterality: Right;   PACEMAKER  IMPLANT N/A 08/31/2018   Symptomatic bradycardia due to mobitz II second degree AV block, permanent afib/ atypical atrial flutter implanted by Dr Kelsie PRESCOTT PLANA REPAIR OF RETINAL DEATACHMENT Right    PARS PLANA VITRECTOMY Bilateral 2004-2006   several (01/18/2013)   REFRACTIVE SURGERY Bilateral    for stigmatism (01/18/2013)   REFRACTIVE SURGERY Left ~ 11/2012   to puff it up cause vision got hazy (01/18/2013)   RIGHT HEART CATH N/A 12/30/2018   Procedure: RIGHT HEART CATH;   Surgeon: Cherrie Toribio SAUNDERS, MD;  Location: [facility] INVASIVE CV LAB;  Service: Cardiovascular;  Laterality: N/A;   RIGHT/LEFT HEART CATH AND CORONARY ANGIOGRAPHY N/A 09/07/2023   Procedure: RIGHT/LEFT HEART CATH AND CORONARY ANGIOGRAPHY;  Surgeon: Wendel Lurena POUR, MD;  Location: [facility] INVASIVE CV LAB;  Service: Cardiovascular;  Laterality: N/A;   VENA CAVA FILTER PLACEMENT  2011?    Allergies: Ms contin [morphine]  Medications: Prior to Admission medications   Medication Sig Start Date End Date Taking? Authorizing Provider  allopurinol  (ZYLOPRIM ) 100 MG tablet Take 50 mg by mouth every other day. 10/28/23  Yes [provider]  amLODipine  (NORVASC ) 5 MG tablet Take 2 tablets (10 mg total) by mouth daily. 10/26/23  Yes Milford, Harlene HERO, FNP  apixaban  (ELIQUIS ) 5 MG TABS tablet Take 5 mg by mouth 2 (two) times daily.   Yes [provider]  ARIPiprazole  (ABILIFY ) 2 MG tablet Take 1 tablet (2 mg total) by mouth at bedtime. 12/06/23  Yes Jens Durand, MD  citalopram  (CELEXA ) 40 MG tablet Take 40 mg by mouth at bedtime. 10/28/23  Yes [provider]  diazepam  (VALIUM ) 5 MG tablet Take 1 tablet (5 mg total) by mouth 2 (two) times daily as needed for anxiety or muscle spasms. 12/06/23  Yes Jens Durand, MD  insulin  glargine (LANTUS  SOLOSTAR) 100 UNIT/ML Solostar Pen Inject 20 Units into the skin daily.   Yes [provider]  insulin  lispro (HUMALOG) 100 UNIT/ML injection Inject 5 Units into the skin 3 (three) times daily before meals.   Yes [provider]  iron  polysaccharides (NIFEREX) 150 MG capsule Take 1 capsule (150 mg total) by mouth daily. 12/07/23  Yes Jens Durand, MD  levothyroxine  (SYNTHROID ) 100 MCG tablet Take 1 tablet (100 mcg total) by mouth daily before breakfast. 12/07/23  Yes Jens Durand, MD  Melatonin 10 MG TABS Take 10 mg by mouth.   Yes [provider]  Multiple Vitamins-Minerals (CENTRUM SILVER) CHEW Chew 2 each by mouth daily.    Yes [provider]  rosuvastatin  (CRESTOR ) 10 MG tablet Take 1 tablet (10 mg total) by mouth daily. 05/09/16  Yes Epifanio Rolland Robin, MD  sennosides-docusate sodium  (SENOKOT-S) 8.6-50 MG tablet Take 2 tablets by mouth daily.   Yes [provider]  insulin  aspart (NOVOLOG ) 100 UNIT/ML injection Inject 5 Units into the skin 3 (three) times daily with meals. Patient not taking: Reported on 12/17/2023 12/06/23   Jens Durand, MD     Family History  Problem Relation Age of Onset   Cerebral aneurysm Mother    Hypertension Father    Cerebral aneurysm Maternal Grandfather    Cerebral aneurysm Maternal Aunt    Cancer Maternal Uncle     Social History   Socioeconomic History   Marital status: Single    Spouse name: Not on file   Number of children: Not on file   Years of education: Not on file   Highest education level: Not on file  Occupational History   Occupation: disabled  Tobacco Use   Smoking status: Former    Current packs/day: 0.00    Average packs/day: 0.1 packs/day for 30.0 years (1.5 ttl pk-yrs)    Types: Cigarettes    Start date: 35    Quit date: 58    Years since quitting: 35.7   Smokeless tobacco: Never   Tobacco comments:    01/19/2013 quit smoking cigarettes in the early '90's  Vaping Use   Vaping status: Never Used  Substance and Sexual Activity   Alcohol use: Not Currently    Comment: rare now but never a heavy drinker   Drug use: No   Sexual activity: Never  Other Topics Concern   Not on file  Social History Narrative   Not on file   Social Drivers of Health   Financial Resource Strain: Not on file  Food Insecurity: No Food Insecurity (12/16/2023)   Hunger Vital Sign    Worried About Running Out of Food in the Last Year: Never true    Ran Out of Food in the Last Year: Never true  Transportation Needs: No Transportation Needs (12/16/2023)   PRAPARE - Administrator, Civil Service (Medical): No    Lack of  Transportation (Non-Medical): No  Physical Activity: Not on file  Stress: Not on file  Social Connections: Moderately Isolated (12/16/2023)   Social Connection and Isolation Panel    Frequency of Communication with Friends and Family: More than three times a week    Frequency of Social Gatherings with Friends and Family: Never    Attends Religious Services: More than 4 times per year    Active Member of [employer] or Organizations: No    Attends Banker Meetings: Never    Marital Status: Divorced     Review of Systems  Constitutional:  Positive for fatigue.  Respiratory:  Positive for shortness of breath.   Gastrointestinal:  Positive for abdominal pain (epigastric) and nausea.  Patient denies any headache, chest pain, or fever/chills. All other systems are negative.   Vital Signs: BP (!) 132/47 (BP Location: Left Wrist)   Pulse (!) 50   Temp 97.6 F (36.4 C) (Oral)   Resp 12   Ht 5' 1 (1.549 m)   Wt (!) 306 lb (138.8 kg)   SpO2 99%   BMI 57.82 kg/m    Physical Exam Constitutional:      Appearance: She is obese.  HENT:     Mouth/Throat:     Mouth: Mucous membranes are moist.     Pharynx: Oropharynx is clear.  Cardiovascular:     Rate and Rhythm: Normal rate and regular rhythm.     Heart sounds: Normal heart sounds.  Pulmonary:     Effort: Pulmonary effort is normal.     Comments: Decreased throughout  Abdominal:     General: Abdomen is flat.     Palpations: Abdomen is soft.     Tenderness: There is abdominal tenderness (epigastric).  Skin:    General: Skin is warm and dry.  Neurological:     Mental Status: She is alert and oriented to person, place, and time.  Psychiatric:        Mood and Affect: Mood normal.        Behavior: Behavior normal.     Imaging: CT LIVER ABDOMEN W WO CONTRAST Result Date: 12/16/2023 EXAM: CT ABDOMEN WITH AND WITHOUT CONTRAST 12/16/2023 08:01:00 PM TECHNIQUE: CT of the abdomen was performed with the  administration of  intravenous contrast. Multiplanar reformatted images are provided for review. Automated exposure control, iterative reconstruction, and/or weight based adjustment of the mA/kV was utilized to reduce the radiation dose to as low as reasonably achievable. COMPARISON: CT 12/15/2023 CLINICAL HISTORY: Liver lesion, > 1cm. FINDINGS: LOWER CHEST: Small right pleural effusion and pneumonia or atelectasis. HEPATOBILIARY: Redemonstrated hypoattenuating lesions throughout the liver with peripheral enhancement on the portal venous phase in the right base. The largest lesion is in the inferior right hepatic lobe measuring 7.9 cm (series 8 image 92). Differential considerations include metastases or hepatic abscesses. Cholecystectomy. SPLEEN: Spleen demonstrates no acute abnormality. PANCREAS: Pancreas demonstrates no acute abnormality. ADRENAL GLANDS: Adrenal glands demonstrate no acute abnormality. KIDNEYS: Nonobstructing left nephrolithiasis. No hydronephrosis. No perinephric or periureteral stranding. GI AND BOWEL: Stomach and duodenal sweep demonstrate no acute abnormality. There is no bowel obstruction. No abnormal bowel wall thickening or distension. PERITONEUM AND RETROPERITONEUM: No ascites or free air. Aorta is normal in caliber. IVC filter. LYMPH NODES: No lymphadenopathy. BONES AND SOFT TISSUES: No acute abnormality of the visualized bones. Body wall edema. IMPRESSION: 1. Hypoattenuating liver lesions with peripheral enhancement, largest measuring 7.9 cm in the inferior right hepatic lobe concerning for metastases. Differential consideration includes hepatic abscesses. Hepatic protocol MRI with and without IV contrast is recommended. 2. Small right pleural effusion and RLL pneumonia or atelectasis. Electronically signed by: Norman Gatlin MD 12/16/2023 08:53 PM EDT RP Workstation: [id number]   CT Renal Stone Study Result Date: 12/15/2023 EXAM: CT UROGRAM 12/15/2023 11:01:37 PM TECHNIQUE: CT of the abdomen and pelvis  was performed before and after the administration of intravenous contrast as per CT urogram protocol. Multiplanar reformatted images as well as MIP urogram images are provided for review. Automated exposure control, iterative reconstruction, and/or weight based adjustment of the mA/kV was utilized to reduce the radiation dose to as low as reasonably achievable. COMPARISON: CT 12/25/2012 CLINICAL HISTORY: Abdominal/flank pain, stone suspected. FINDINGS: LOWER CHEST: Small right pleural effusions. Right lower lobe airspace opacity suspicious for pneumonia. LIVER: Multiple hypoattenuating lesions throughout the liver were not previously seen and are concerning for metastases. These are incompletely assessed without IV contrast. The largest measures approximately 7.7 cm in the inferior right hepatic lobe (series 3, image 36). GALLBLADDER AND BILE DUCTS: Cholecystectomy. SPLEEN: No acute abnormality. PANCREAS: No acute abnormality. ADRENAL GLANDS: No acute abnormality. KIDNEYS, URETERS AND BLADDER: Nonobstructing left nephrolithiasis. No hydronephrosis. No perinephric or periureteral stranding. Urinary bladder is unremarkable. GI AND BOWEL: Stomach demonstrates no acute abnormality. There is no bowel obstruction. PERITONEUM AND RETROPERITONEUM: No ascites. No free air. VASCULATURE: Similar configuration of the IVC filter. Aortic atherosclerotic calcification. LYMPH NODES: No lymphadenopathy. REPRODUCTIVE ORGANS: No acute abnormality. BONES AND SOFT TISSUES: No acute osseous abnormality. Diffuse edema in the bilateral flanks and anterior abdominal wall pannus. IMPRESSION: 1. Multiple hypoattenuating liver lesions concerning for metastases, incompletely assessed without IV contrast. Largest measures approximately 7.7 cm in the inferior right hepatic lobe. Further evaluation with CT with IV contrast is recommended. 2. Right lower lobe airspace opacity suspicious for pneumonia. 3. Nonobstructing left nephrolithiasis. No  hydronephrosis. Electronically signed by: Norman Gatlin MD 12/15/2023 11:10 PM EDT RP Workstation: [id number]   DG Chest 2 View Result Date: 12/15/2023 CLINICAL DATA:  Chest pain. EXAM: CHEST - 2 VIEW COMPARISON:  Chest radiograph dated 11/11/2023 FINDINGS: There is cardiomegaly with vascular congestion. No focal consolidation, pleural effusion or pneumothorax. Left pectoral pacemaker device. Atherosclerotic calcification of the aorta. No acute osseous pathology. IMPRESSION: Cardiomegaly with vascular  congestion.  Pneumonia is not excluded. Electronically Signed   By: Vanetta Chou M.D.   On: 12/15/2023 21:38   US  RT UPPER EXTREM LTD SOFT TISSUE NON VASCULAR Result Date: 11/30/2023 CLINICAL DATA:  Abscess. Axillary abscess and status post bedside I and D. Evaluate for a persistent abscess. EXAM: ULTRASOUND RIGHT UPPER EXTREMITY LIMITED TECHNIQUE: Ultrasound examination of the upper extremity soft tissues was performed in the area of clinical concern. COMPARISON:  None Available. FINDINGS: Poorly defined heterogeneous hypoechoic area at the area of concern in the right axilla. This area roughly measures 1.9 x 1.0 x 1.6 cm. IMPRESSION: Poorly defined superficial heterogeneous area at the area of concern in the right axilla. This area measures up to 1.9 cm. This could represent a small abscess or phlegmon. Electronically Signed   By: Juliene Balder M.D.   On: 11/30/2023 16:29   US  RENAL Result Date: 11/20/2023 CLINICAL DATA:  Acute kidney injury. EXAM: RENAL / URINARY TRACT ULTRASOUND COMPLETE COMPARISON:  03/11/2023 and 09/15/2022. Abdomen and pelvis CT dated 01/18/2013. FINDINGS: Right Kidney: Renal measurements: 9.5 x 5.8 x 4.7 cm = volume: 134 mL. Echogenicity within normal limits. 2.2 cm simple appearing cyst, not requiring imaging follow-up. No mass or hydronephrosis visualized. Left Kidney: Renal measurements: 10.0 x 5.0 x 4.5 cm = volume: 119 mL. Echogenicity within normal limits. Some portions of the  kidney are not well visualized due to shadowing from overlying bowel loops. No mass or hydronephrosis visualized. Specifically, the previously described 2.2 cm midpole hypoechoic mass was not clearly visualized today. There is a suggestion of a corresponding area partially obscured by overlying bowel-gas on image number 27/32. There is also a similar-appearing area in the lower pole of the left kidney on the CT dated 01/18/2013. Based on the current and previous images, this most likely represents a minimally complicated cyst. Bladder: Appears normal for degree of bladder distention. Other: None. IMPRESSION: 1. No acute abnormality. 2. The previously described 2.2 cm midpole left renal mass was not clearly visualized today. There is a suggestion of a corresponding area partially obscured by overlying bowel-gas on image number 27/32. There is also a similar-appearing area in the lower pole of the left kidney on the CT dated 01/18/2013. Based on the previous images, this most likely represents a minimally complicated cyst. This could be further evaluated with pre and postcontrast abdomen CT or MRI when the patient's condition permits. Electronically Signed   By: Elspeth Bathe M.D.   On: 11/20/2023 16:57    Labs:  CBC: Recent Labs    11/26/23 0355 11/29/23 0857 12/02/23 0545 12/15/23 2054  WBC 6.6 7.6 7.3 8.1  HGB 8.1* 8.0* 8.2* 8.7*  HCT 26.9* 26.8* 27.0* 29.8*  PLT 136* 127* 157 154    COAGS: Recent Labs    09/05/23 0439 09/06/23 0440 09/07/23 0522 12/17/23 0209  INR 1.7* 1.4* 1.3* 2.1*    BMP: Recent Labs    12/05/23 0540 12/06/23 0550 12/15/23 2054 12/17/23 0209  NA 138 140 136 137  K 3.8 4.4 4.6 4.9  CL 98 100 98 100  CO2 30 30 24 25   GLUCOSE 131* 145* 225* 125*  BUN 98* 108* 135* 135*  CALCIUM  8.6* 8.8* 8.6* 8.4*  CREATININE 3.15* 3.19* 3.65* 3.38*  GFRNONAA 16* 15* 13* 14*    LIVER FUNCTION TESTS: Recent Labs    11/11/23 1414 11/11/23 1503 11/15/23 0517  12/03/23 0534 12/05/23 0540 12/06/23 0550 12/15/23 2054 12/17/23 0209  BILITOT 0.9 1.0  --  0.7  --   --  0.5  --   AST 22 21  --  20  --   --  23  --   ALT 14 14  --  9  --   --  16  --   ALKPHOS 59 55  --  59  --   --  79  --   PROT 6.4* 6.2*  --  5.9*  --   --  5.7*  --   ALBUMIN  3.7 3.4*   < > 3.3* 2.9* 3.1* 3.1* 2.7*   < > = values in this interval not displayed.    TUMOR MARKERS: No results for input(s): AFPTM, CEA, CA199, CHROMGRNA in the last 8760 hours.  Assessment and Plan:  Liver Lesions: [name] is a 68 y.o. female with a history of  Afib on Eliquis , Heart failure, DVT/PE s/p IVC filter in 2011, CKD stage IV, and morbid obesity who presented to the ED with shortness of breath and volume overload. Imaging with incidental finding of liver lesions concerning for metastases. IR consulted for possible inpatient biopsy. Imaging reviewed by Dr. Jennefer and approved for CT biopsy.   -Patient on Eliquis  for A-fib; last dose 9/10 -Discussed with team that earliest inpatient biopsy would be next week; they prefer this over outpatient procedure -Patient transitioned to IV heparin  infusion; will need to be held at least 4hrs prior to procedure -Will plan for tentative biopsy on Monday 9/15 -NPO at [facility]  -Risks and benefits of liver biopsy was discussed with the patient and/or patient's family including, but not limited to bleeding, infection, damage to adjacent structures or low yield requiring additional tests.  All of the questions were answered and there is agreement to proceed.  Consent signed and in chart.   Thank you for allowing our service to participate in [name] 's care.    Electronically Signed: Lattie Cervi M Ahilyn Nell, PA-C   12/17/2023, 1:55 PM     I spent a total of 40 Minutes in face to face in clinical consultation, greater than 50% of which was counseling/coordinating care for liver biopsy.

## 2023-12-17 NOTE — Consult Note (Signed)
 WOC Nurse Consult Note: Consult requested for bilat legs and right axilla.  Pt is familiar to [facility] team from recent admission at [facility].  Pt has a full thickness post-op wound to right underarm, 1.5X1.5X.8cm red and moist with small amt bloody drainage. Bilat legs with chronic generalized edema and weeping mod amt yellow drainage. Chronic full thickness wounds to bilat anterior calves as follows: Left leg 6X10X.2cm and 5X7X.2cm, red and moist Right inner leg 4X6X.2cm and outer leg 3X1X.2cm, red and moist. Topical treatment orders provided for ortho tech to apply bilat Una boots and change Q Thurs.  Bedside nurses to perform as follows: 1. Ortho teach, please apply bilat Una boots over the dressings which have been applied and change Q THURS 2. Bedside nurse please change dressings to bilat legs Q Thurs before ortho tech comes to apply bilat Una boots as follows: Apply Aquacel to open wounds, then cover with ABD pads.  Remove previous dressings by moistening with NS each time 3. Pack right underarm with moist gauze 2X2 Q day, using swab to fill, then cover with foam dressing.  Change foam dressing Q 3 days or PRN soiling.  Please re-consult if further assistance is needed.  Thank-you,  Stephane Fought MSN, RN, CWOCN, CWCN-AP, CNS Contact Mon-Fri 0700-1500: [phone]

## 2023-12-17 NOTE — Progress Notes (Signed)
 PHARMACY - ANTICOAGULATION CONSULT NOTE  Pharmacy Consult for Heparin  drip Indication: atrial fibrillation  Allergies  Allergen Reactions   Ms Contin [Morphine] Hives, Rash and Other (See Comments)    Broke out in brown spots all over.     Patient Measurements: Height: 5' 1 (154.9 cm) Weight: (!) 138.8 kg (306 lb) IBW/kg (Calculated) : 47.8 HEPARIN  DW (KG): 83.5  Vital Signs: Temp: 97.6 F (36.4 C) (09/11 1600) Temp Source: Oral (09/11 1600) BP: 120/44 (09/11 1600) Pulse Rate: 50 (09/11 1600)  Labs: Recent Labs    12/15/23 2054 12/15/23 2333 12/17/23 0209 12/17/23 1924  HGB 8.7*  --   --   --   HCT 29.8*  --   --   --   PLT 154  --   --   --   APTT  --   --   --  75*  LABPROT  --   --  24.6*  --   INR  --   --  2.1*  --   HEPARINUNFRC  --   --   --  >1.10*  CREATININE 3.65*  --  3.38*  --   TROPONINIHS 124* 105*  --   --     Estimated Creatinine Clearance: 21.5 mL/min (A) (by C-G formula based on SCr of 3.38 mg/dL (H)).   Medical History: Past Medical History:  Diagnosis Date   A-fib (HCC)    Anemia    Anticoagulated on Coumadin , chronically 09/03/2011   Anxiety    Arthritis    right hip; both knees; left wrist/shoulder; back (01/19/2013   Bleeding on Coumadin  08/2012; 01/18/2013   [facility] admissions (01/19/2013)   CHF (congestive heart failure) (HCC)    2-3 times (01/19/2013)   Chronic lower back pain    Depression    DVT (deep venous thrombosis) (HCC) 10 years ago   numerous/notes 01/18/2013   GERD (gastroesophageal reflux disease)    Gout    Headache(784.0)    maybe weekly (01/19/2013)   Heart murmur    High cholesterol    been off RX for this at one time (01/18/2013)   History of blood transfusion 1983; 04/2012   3 w/ childbirth; hospitalized for pain (01/19/2013)   Hypertension    Hypothyroidism    Migraines    twice/yr maybe (01/19/2013)   Obstructive sleep apnea 05/03/2012   OSA (obstructive sleep apnea)    sent me for test  in 04/2012; never ordered mask, etc (01/19/2013)   PE (pulmonary thromboembolism) (HCC) 3 years ag0   3/notes 01/18/2013   Pneumonia before 2011   once' (01/18/2013)   Renal disorder    kindey function low; Metformin was destroying my kidneys (01/19/2013)   Shortness of breath    only related to my CHF (01/18/2013)   Swelling of hand 08/31/2014   RT HAND   Type II diabetes mellitus (HCC)    UTI (urinary tract infection) 08/31/2014    Medications:  Medications Prior to Admission  Medication Sig Dispense Refill Last Dose/Taking   allopurinol  (ZYLOPRIM ) 100 MG tablet Take 50 mg by mouth every other day.   12/15/2023   amLODipine  (NORVASC ) 5 MG tablet Take 2 tablets (10 mg total) by mouth daily.   12/15/2023   apixaban  (ELIQUIS ) 5 MG TABS tablet Take 5 mg by mouth 2 (two) times daily.   12/15/2023   ARIPiprazole  (ABILIFY ) 2 MG tablet Take 1 tablet (2 mg total) by mouth at bedtime.   12/15/2023   citalopram  (CELEXA ) 40 MG tablet  Take 40 mg by mouth at bedtime.   12/15/2023   diazepam  (VALIUM ) 5 MG tablet Take 1 tablet (5 mg total) by mouth 2 (two) times daily as needed for anxiety or muscle spasms. 10 tablet 0 12/15/2023   insulin  glargine (LANTUS  SOLOSTAR) 100 UNIT/ML Solostar Pen Inject 20 Units into the skin daily.   12/15/2023   insulin  lispro (HUMALOG) 100 UNIT/ML injection Inject 5 Units into the skin 3 (three) times daily before meals.   12/15/2023   iron  polysaccharides (NIFEREX) 150 MG capsule Take 1 capsule (150 mg total) by mouth daily.   12/15/2023   levothyroxine  (SYNTHROID ) 100 MCG tablet Take 1 tablet (100 mcg total) by mouth daily before breakfast.   12/15/2023   Melatonin 10 MG TABS Take 10 mg by mouth.   12/15/2023   Multiple Vitamins-Minerals (CENTRUM SILVER) CHEW Chew 2 each by mouth daily.   12/15/2023   rosuvastatin  (CRESTOR ) 10 MG tablet Take 1 tablet (10 mg total) by mouth daily. 30 tablet 0 12/15/2023   sennosides-docusate sodium  (SENOKOT-S) 8.6-50 MG tablet Take 2 tablets by mouth daily.    12/15/2023   insulin  aspart (NOVOLOG ) 100 UNIT/ML injection Inject 5 Units into the skin 3 (three) times daily with meals. (Patient not taking: Reported on 12/17/2023)   Not Taking    Assessment: 68 yo female with h/o PAF on apixaban  PTA admitted with AKI. MD wishes to convert to heparin  drip at this time. Last dose of apixaban  9/10 at ~1600.   aPTT this evening is therapeutic at 75 (HL > 1.1). No issues with bleeding or infusion reported.  Goal of Therapy:  Heparin  level 0.3-0.7 units/ml aPTT 66-102 seconds Monitor platelets by anticoagulation protocol: Yes   Plan:  Continue Heparin  drip at 1200 units/hr Heparin  level and aPTT in 8 hours Continue aPTTs until heparin  levels correlate Daily CBC, Heparin  level and APTT (for now)   Thank you for allowing pharmacy to be a part of this patient's care.   Bascom JAYSON Louder, PharmD 12/17/2023 8:24 PM  **Pharmacist phone directory can be found on [email or web address] listed under [facility]**

## 2023-12-17 NOTE — Progress Notes (Signed)
 Orthopedic Tech Progress Note Patient Details:  [name] [birth year] [id number]  Ortho Devices Type of Ortho Device: Radio broadcast assistant Ortho Device/Splint Location: BLE Ortho Device/Splint Interventions: Application, Ordered   Post Interventions Patient Tolerated: Well  [name] 12/17/2023, 3:44 PM

## 2023-12-17 NOTE — Progress Notes (Signed)
 PHARMACY - ANTICOAGULATION CONSULT NOTE  Pharmacy Consult for Heparin  drip Indication: atrial fibrillation  Allergies  Allergen Reactions   Ms Contin [Morphine] Hives, Rash and Other (See Comments)    Broke out in brown spots all over.     Patient Measurements: Height: 5' 1 (154.9 cm) Weight: (!) 138.8 kg (306 lb) IBW/kg (Calculated) : 47.8 HEPARIN  DW (KG): 83.5  Vital Signs: Temp: 97.9 F (36.6 C) (09/11 0726) Temp Source: Oral (09/11 0726) BP: 133/37 (09/11 0726) Pulse Rate: 50 (09/11 0726)  Labs: Recent Labs    12/15/23 2054 12/15/23 2333 12/17/23 0209  HGB 8.7*  --   --   HCT 29.8*  --   --   PLT 154  --   --   LABPROT  --   --  24.6*  INR  --   --  2.1*  CREATININE 3.65*  --  3.38*  TROPONINIHS 124* 105*  --     Estimated Creatinine Clearance: 21.5 mL/min (A) (by C-G formula based on SCr of 3.38 mg/dL (H)).   Medical History: Past Medical History:  Diagnosis Date   A-fib (HCC)    Anemia    Anticoagulated on Coumadin , chronically 09/03/2011   Anxiety    Arthritis    right hip; both knees; left wrist/shoulder; back (01/19/2013   Bleeding on Coumadin  08/2012; 01/18/2013   [facility] admissions (01/19/2013)   CHF (congestive heart failure) (HCC)    2-3 times (01/19/2013)   Chronic lower back pain    Depression    DVT (deep venous thrombosis) (HCC) 10 years ago   numerous/notes 01/18/2013   GERD (gastroesophageal reflux disease)    Gout    Headache(784.0)    maybe weekly (01/19/2013)   Heart murmur    High cholesterol    been off RX for this at one time (01/18/2013)   History of blood transfusion 1983; 04/2012   3 w/ childbirth; hospitalized for pain (01/19/2013)   Hypertension    Hypothyroidism    Migraines    twice/yr maybe (01/19/2013)   Obstructive sleep apnea 05/03/2012   OSA (obstructive sleep apnea)    sent me for test in 04/2012; never ordered mask, etc (01/19/2013)   PE (pulmonary thromboembolism) (HCC) 3 years ag0   3/notes  01/18/2013   Pneumonia before 2011   once' (01/18/2013)   Renal disorder    kindey function low; Metformin was destroying my kidneys (01/19/2013)   Shortness of breath    only related to my CHF (01/18/2013)   Swelling of hand 08/31/2014   RT HAND   Type II diabetes mellitus (HCC)    UTI (urinary tract infection) 08/31/2014    Medications:  Medications Prior to Admission  Medication Sig Dispense Refill Last Dose/Taking   allopurinol  (ZYLOPRIM ) 100 MG tablet Take 50 mg by mouth every other day.      amLODipine  (NORVASC ) 5 MG tablet Take 2 tablets (10 mg total) by mouth daily.      apixaban  (ELIQUIS ) 5 MG TABS tablet Take 5 mg by mouth 2 (two) times daily.      ARIPiprazole  (ABILIFY ) 2 MG tablet Take 1 tablet (2 mg total) by mouth at bedtime.      citalopram  (CELEXA ) 40 MG tablet Take 40 mg by mouth at bedtime.      diazepam  (VALIUM ) 5 MG tablet Take 1 tablet (5 mg total) by mouth 2 (two) times daily as needed for anxiety or muscle spasms. 10 tablet 0    insulin  aspart (NOVOLOG ) 100 UNIT/ML  injection Inject 5 Units into the skin 3 (three) times daily with meals.      insulin  glargine (LANTUS  SOLOSTAR) 100 UNIT/ML Solostar Pen Inject 20 Units into the skin daily.      iron  polysaccharides (NIFEREX) 150 MG capsule Take 1 capsule (150 mg total) by mouth daily.      levothyroxine  (SYNTHROID ) 100 MCG tablet Take 1 tablet (100 mcg total) by mouth daily before breakfast.      Melatonin 10 MG TABS Take 10 mg by mouth.      Multiple Vitamins-Minerals (CENTRUM SILVER) CHEW Chew 2 each by mouth daily.      rosuvastatin  (CRESTOR ) 10 MG tablet Take 1 tablet (10 mg total) by mouth daily. 30 tablet 0     Assessment: 68 yo female with h/o PAF on apixaban  PTA admitted with AKI. MD wishes to convert to heparin  drip at this time. Last dose of apixaban  9/10 at ~1600.   Goal of Therapy:  Heparin  level 0.3-0.7 units/ml aPTT 66-102 seconds Monitor platelets by anticoagulation protocol: Yes   Plan:   Heparin  drip at 1200 units/hr Heparin  level and aPTT in 8 hours Continue aPTTs until heparin  levels correlate Daily CBC, Heparin  level and APTT (for now)  Dyon Rotert A. Lyle, PharmD, BCPS, FNKF Clinical Pharmacist [place] Please utilize Amion for appropriate phone number to reach the unit pharmacist [facility])

## 2023-12-17 NOTE — Evaluation (Signed)
 Physical Therapy Evaluation Patient Details Name: [name] MRN: [id number] DOB: 07/22/1955 Today's Date: 12/17/2023  History of Present Illness  Pt is a 68 y.o. female who presented 12/15/23 with SOB and volume overload secondary to LLL CAP and HFpEF exacerbation. Pt also with worsening renal failure and found to have liver lesions concerning for metastases. Pt recently admitted 7/28 - 8/31 for intentional overdose of OxyContin . 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 PTA, she was most recently at a SNF for rehab, ambulating bedroom distances to the bathroom mod I with a RW. Currently, the pt is limited by bil lower extremity pain from chronic lower leg wounds and by endurance deficits. She is currently requiring minA for bed mobility and up to minA for transfers and to ambulate short bedroom distances with a RW. She is at risk for falls, demonstrating deficits in strength, balance, power, and endurance. Recommend pt return to her short-term inpatient rehab, < 3 hours/day, for further skilled PT. Will continue to follow acutely.       If plan is discharge home, recommend the following: A little help with walking and/or transfers;A little help with bathing/dressing/bathroom;Assistance with cooking/housework;Assist for transportation;Help with stairs or ramp for entrance;Direct supervision/assist for medications management   Can travel by private vehicle   Yes    Equipment Recommendations Wheelchair (measurements PT);Wheelchair cushion (measurements PT);BSC/3in1;Hospital bed;Rolling walker (2 wheels) (bariatric)  Recommendations for Other Services       Functional Status Assessment Patient has had a recent decline in their functional  status and demonstrates the ability to make significant improvements in function in a reasonable and predictable amount of time.     Precautions / Restrictions Precautions Precautions: Fall Precaution/Restrictions Comments: watch SpO2; chronic lower leg wounds Restrictions Weight Bearing Restrictions Per Provider Order: No      Mobility  Bed Mobility Overal bed mobility: Needs Assistance Bed Mobility: Supine to Sit, Sit to Supine     Supine to sit: Min assist, HOB elevated, Used rails Sit to supine: Min assist, HOB elevated, Used rails   General bed mobility comments: Pt needed minA HHA to pull trunk up to sit R EOB, using bed rail as well to pull up to sit. MinA needed to lift legs back onto bed with return to supine.    Transfers Overall transfer level: Needs assistance Equipment used: Rolling walker (2 wheels) Transfers: Sit to/from Stand Sit to Stand: Min assist, Contact guard assist           General transfer comment: The first rep, pt needed minA to power up to stand from EOB. The second rep, pt progressed to CGA for safety only from bedside commode.    Ambulation/Gait Ambulation/Gait assistance: Contact guard assist, Min assist Gait Distance (Feet): 13 Feet (x2 bouts of ~13 ft each bout) Assistive device: Rolling walker (2 wheels) Gait Pattern/deviations: Step-through pattern, Decreased step length - right, Decreased step length - left, Decreased stride length, Trunk flexed, Wide base of support Gait velocity: reduced Gait velocity interpretation: <1.31 ft/sec, indicative of household ambulator   General Gait Details: Pt takes slow, small, wide steps with a flexed posture. Wide BOS likely due to body habitus. Pt limited in distance by fatigue. CGA-minA for balance throughout  Stairs  Wheelchair Mobility     Tilt Bed    Modified Rankin (Stroke Patients Only)       Balance Overall balance assessment: Needs assistance Sitting-balance  support: No upper extremity supported, Feet supported Sitting balance-[name]: Fair Sitting balance - Comments: sits statically EOB with supervision   Standing balance support: Bilateral upper extremity supported, During functional activity, Reliant on assistive device for balance Standing balance-[name]: Poor Standing balance comment: reliant on RW                             Pertinent Vitals/Pain Pain Assessment Pain Assessment: Faces Faces Pain Scale: Hurts even more Pain Location: bil legs Pain Descriptors / Indicators: Discomfort, Grimacing, Guarding, Sore Pain Intervention(s): Monitored during session, Limited activity within patient's tolerance, Repositioned    Home Living Family/patient expects to be discharged to:: Skilled nursing facility Living Arrangements: Alone Available Help at Discharge: Neighbor;Available PRN/intermittently Type of Home: House Home Access: Stairs to enter Entrance Stairs-Rails: Doctor, general practice of Steps: 4   Home Layout: One level Home Equipment: Shower seat;Hand held shower head;Grab bars - tub/shower;Rollator (4 wheels);Rolling Walker (2 wheels) Additional Comments: home info from prior admission, plan to return to SNF at this time    Prior Function Prior Level of Function : Needs assist             Mobility Comments: Has recently been working with PT at the SNF, ambulating bedroom distances mod I with RW ADLs Comments: Has been working with OT at [facility], prior to recent admission 7/28 pt was mod I for ADLs     Extremity/Trunk Assessment   Upper Extremity Assessment Upper Extremity Assessment: Defer to OT evaluation    Lower Extremity Assessment Lower Extremity Assessment: Generalized weakness;RLE deficits/detail;LLE deficits/detail RLE Deficits / Details: hx of peripheral neuropathy impacting sensation inferior to knee, but still able to detect light touch distally; chronic lower leg wounds;  limited knee flexion ROM due to pain, wounds, and body habitus, achieving ~45' knee flexion AAROM RLE Sensation: history of peripheral neuropathy LLE Deficits / Details: hx of peripheral neuropathy impacting sensation inferior to knee, but still able to detect light touch distally; chronic lower leg wounds; limited knee flexion ROM due to pain, wounds, and body habitus, achieving ~45' knee flexion AAROM LLE Sensation: history of peripheral neuropathy    Cervical / Trunk Assessment Cervical / Trunk Assessment: Other exceptions Cervical / Trunk Exceptions: increased body habitus  Communication   Communication Communication: No apparent difficulties    Cognition Arousal: Alert Behavior During Therapy: WFL for tasks assessed/performed   PT - Cognitive impairments: No family/caregiver present to determine baseline                       PT - Cognition Comments: Pt slow to respond initially, but pt's speed with response improved as session progressed. Pt aware of her urinary incontinence and able to sequence mobility Following commands: Impaired Following commands impaired: Follows multi-step commands with increased time     Cueing Cueing Techniques: Verbal cues, Tactile cues     General Comments General comments (skin integrity, edema, etc.): on 2-3L O2 throughout    Exercises     Assessment/Plan    PT Assessment Patient needs continued PT services  PT Problem List Decreased strength;Decreased range of motion;Decreased activity tolerance;Decreased balance;Decreased mobility;Cardiopulmonary status limiting activity;Obesity;Decreased skin integrity;Pain       PT Treatment Interventions DME instruction;Gait training;Stair training;Functional  mobility training;Therapeutic exercise;Therapeutic activities;Balance training;Neuromuscular re-education;Patient/family education    PT Goals (Current goals can be found in the Care Plan section)  Acute Rehab PT Goals Patient Stated  Goal: to get better PT Goal Formulation: With patient Time For Goal Achievement: 12/31/23 Potential to Achieve Goals: Fair    Frequency Min 2X/week     Co-evaluation               AM-PAC PT 6 Clicks Mobility  Outcome Measure Help needed turning from your back to your side while in a flat bed without using bedrails?: A Little Help needed moving from lying on your back to sitting on the side of a flat bed without using bedrails?: A Little Help needed moving to and from a bed to a chair (including a wheelchair)?: A Little Help needed standing up from a chair using your arms (e.g., wheelchair or bedside chair)?: A Little Help needed to walk in hospital room?: Total (<20 ft) Help needed climbing 3-5 steps with a railing? : Total 6 Click Score: 14    End of Session Equipment Utilized During Treatment: Gait belt;Oxygen Activity Tolerance: Patient limited by fatigue Patient left: in bed;with call bell/phone within reach;with bed alarm set Nurse Communication: Mobility status PT Visit Diagnosis: Difficulty in walking, not elsewhere classified (R26.2);Other abnormalities of gait and mobility (R26.89);Muscle weakness (generalized) (M62.81);Unsteadiness on feet (R26.81);Pain Pain - Right/Left:  (bil) Pain - part of body: Leg    Time: 1025-1100 PT Time Calculation (min) (ACUTE ONLY): 35 min   Charges:   PT Evaluation $PT Eval Moderate Complexity: 1 Mod PT Treatments $Therapeutic Activity: 8-22 mins PT General Charges $$ ACUTE PT VISIT: 1 Visit         Theo Ferretti, PT, DPT Acute Rehabilitation Services  Office: [phone]   Theo CHRISTELLA Ferretti 12/17/2023, 11:19 AM

## 2023-12-17 NOTE — Plan of Care (Signed)

## 2023-12-17 NOTE — Plan of Care (Signed)
  Problem: Education: Goal: Ability to describe self-care measures that may prevent or decrease complications (Diabetes Survival Skills Education) will improve Outcome: Not Progressing Goal: Individualized Educational Video(s) Outcome: Not Progressing   Problem: Coping: Goal: Ability to adjust to condition or change in health will improve Outcome: Not Progressing   Problem: Fluid Volume: Goal: Ability to maintain a balanced intake and output will improve Outcome: Not Progressing   Problem: Health Behavior/Discharge Planning: Goal: Ability to identify and utilize available resources and services will improve Outcome: Not Progressing Goal: Ability to manage health-related needs will improve Outcome: Not Progressing   Problem: Metabolic: Goal: Ability to maintain appropriate glucose levels will improve Outcome: Not Progressing   Problem: Nutritional: Goal: Maintenance of adequate nutrition will improve Outcome: Not Progressing Goal: Progress toward achieving an optimal weight will improve Outcome: Not Progressing   Problem: Skin Integrity: Goal: Risk for impaired skin integrity will decrease Outcome: Not Progressing   Problem: Tissue Perfusion: Goal: Adequacy of tissue perfusion will improve Outcome: Not Progressing   Problem: Education: Goal: Knowledge of General Education information will improve Description: Including pain rating scale, medication(s)/side effects and non-pharmacologic comfort measures Outcome: Not Progressing   Problem: Health Behavior/Discharge Planning: Goal: Ability to manage health-related needs will improve Outcome: Not Progressing   Problem: Clinical Measurements: Goal: Ability to maintain clinical measurements within normal limits will improve Outcome: Not Progressing Goal: Will remain free from infection Outcome: Not Progressing Goal: Diagnostic test results will improve Outcome: Not Progressing Goal: Respiratory complications will  improve Outcome: Not Progressing Goal: Cardiovascular complication will be avoided Outcome: Not Progressing   Problem: Activity: Goal: Risk for activity intolerance will decrease Outcome: Not Progressing   Problem: Nutrition: Goal: Adequate nutrition will be maintained Outcome: Not Progressing   Problem: Coping: Goal: Level of anxiety will decrease Outcome: Not Progressing

## 2023-12-17 NOTE — Progress Notes (Signed)
 PROGRESS NOTE                                                                                                                                                                                                             Patient Demographics:    [name], is a 68 y.o. female, DOB - [birth year], FMW:[id number]  Outpatient Primary MD for the patient is Valma Carwin, MD    LOS - 0  Admit date - 12/15/2023    Chief Complaint  Patient presents with   Chest Pain       Brief Narrative (HPI from H&P)     68 y.o. female long-term resident at SNF with medical history significant of pulmonary hypertension, chronic respiratory failure requiring 2L [place] at baseline, Afib on Eliquis , HFpEF (EF 60-65% in 08/2023), DM2 (A1c 5.7 in 08/2023), DVT/PE x3 s/p IVC filter in 09/2009, CKD stage IV, and morbid obesity p/w SOB/cp iso volume overload 2/2 LLL CAP and HFpEF exacerbation c/b worsening renal failure and found to have liver lesions c/f metastases.    Subjective:    [name] today has, No headache, No chest pain, No abdominal pain - No Nausea, No new weakness tingling or numbness, mild SOB   Assessment  & Plan :   Respiratory insufficiency iso volume overload 2/2 LLL CAP and HFpEF exacerbation c/b worsening renal failure   Multifactorial SOB/cp but likely exacerbated by volume overload iso worsening renal failure  has significant volume overload, placed on IV diuretics per nephrology, shortness of breath and chest discomfort improved, continue to monitor with ongoing diuresis.   AKI on CKD (chronic kidney disease) stage 4, GFR 15-29 ml/min   Per nephrology   Pneumonia on admission.  Empiric antibiotics.  Speech eval.  Supportive care with oxygen and nebulizer treatments.  Follow cultures.  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  Follow-up with PCP for weight loss.  BMI of 57  HFpEF EF 60% on  recent echocardiogram recent [facility] noted no significant CAD, PA pressure 62/16, wedge of 11, PVR 4.4, group 3 pulmonary hypertension suspected  Diuresis per nephrology.   Liver lesions c/f metastases  -IR consulted patient was on Eliquis  biopsy is scheduled for coming Monday.  Will check CA 19.9 and CEA levels as well.   Paroxysmal Afib  -PTA Eliquis  5mg  BID  Hypothyroid  -PTA Synthroid    HTN  -PTA amlodipine    Chronic lymphedema - Venous stasis ulcer of LLE  -Continue wrapping BLE and Unna boots to BLE as well   MDD  -PTA Abilify , Celexa , and Valium   DM2 -PTA long acting insulin  20U daily and SSI TID AC prn   Lab Results  Component Value Date   HGBA1C 5.7 (H) 09/04/2023   .CBG (last 3)  Recent Labs    12/16/23 1647 12/16/23 2104 12/17/23 0724  GLUCAP 199* 199* 118*          Condition - Extremely Guarded  Family Communication  :  none  Code Status :  Full  Consults  :  Renal, IR  PUD Prophylaxis :     Procedures  :     CT Liver - 1. Hypoattenuating liver lesions with peripheral enhancement, largest measuring 7.9 cm in the inferior right hepatic lobe concerning for metastases. Differential consideration includes hepatic abscesses. Hepatic protocol MRI with and without IV contrast is recommended. 2. Small right pleural effusion and RLL pneumonia or atelectasis.      Disposition Plan  :    Status is: Inpatient   DVT Prophylaxis  :  Hep gtt   Lab Results  Component Value Date   PLT 154 12/15/2023    Diet :  Diet Order             Diet Carb Modified Fluid consistency: Thin; Room service appropriate? Yes  Diet effective now                    Inpatient Medications  Scheduled Meds:  acetaminophen   1,000 mg Oral TID   amLODipine   10 mg Oral Daily   ARIPiprazole   2 mg Oral QHS   azithromycin   500 mg Oral Daily   citalopram   40 mg Oral QHS   furosemide   80 mg Intravenous BID   furosemide   80 mg Intravenous BID   insulin  aspart  0-15 Units  Subcutaneous TID WC   insulin  glargine  20 Units Subcutaneous Daily   levothyroxine   100 mcg Oral QAC breakfast   melatonin  10 mg Oral QHS   rosuvastatin   10 mg Oral Daily   Continuous Infusions:  cefTRIAXone  (ROCEPHIN )  IV 1 g (12/17/23 1018)   heparin      PRN Meds:.diazepam , HYDROmorphone  (DILAUDID ) injection    Objective:   Vitals:   12/16/23 1500 12/16/23 1510 12/16/23 2030 12/17/23 0726  BP: (!) 146/52   (!) 133/37  Pulse: (!) 50   (!) 50  Resp: 20   12  Temp:  98.7 F (37.1 C) 97.6 F (36.4 C) 97.9 F (36.6 C)  TempSrc:   Oral Oral  SpO2: 100%   100%  Weight:      Height:        Wt Readings from Last 3 Encounters:  12/15/23 (!) 138.8 kg  12/05/23 (!) 137.2 kg  11/05/23 136.1 kg    No intake or output data in the 24 hours ending 12/17/23 1048   Physical Exam  Awake Alert, No new F.N deficits, Normal affect [place].AT,PERRAL Supple Neck, No JVD,   Symmetrical Chest wall movement, Good air movement bilaterally, CTAB RRR,No Gallops,Rubs or new Murmurs,  +ve B.Sounds, Abd Soft, No tenderness,   2+ edema, both lower extremities under bandage    Data Review:    Recent Labs  Lab 12/15/23 2054  WBC 8.1  HGB 8.7*  HCT 29.8*  PLT 154  MCV 104.6*  MCH 30.5  MCHC 29.2*  RDW 16.6*  LYMPHSABS 0.4*  MONOABS 0.6  EOSABS 0.1  BASOSABS 0.0    Recent Labs  Lab 12/15/23 2054 12/16/23 0013 12/17/23 0209  NA 136  --  137  K 4.6  --  4.9  CL 98  --  100  CO2 24  --  25  ANIONGAP 14  --  12  GLUCOSE 225*  --  125*  BUN 135*  --  135*  CREATININE 3.65*  --  3.38*  AST 23  --   --   ALT 16  --   --   ALKPHOS 79  --   --   BILITOT 0.5  --   --   ALBUMIN  3.1*  --  2.7*  LATICACIDVEN  --  1.0  --   INR  --   --  2.1*  BNP 583.7*  --   --   PHOS  --   --  5.5*  CALCIUM  8.6*  --  8.4*      Recent Labs  Lab 12/15/23 2054 12/16/23 0013 12/17/23 0209  LATICACIDVEN  --  1.0  --   INR  --   --  2.1*  BNP 583.7*  --   --   CALCIUM  8.6*  --  8.4*     --------------------------------------------------------------------------------------------------------------- Lab Results  Component Value Date   CHOL 105 09/03/2023   HDL 50 09/03/2023   LDLCALC 44 09/03/2023   TRIG 56 09/03/2023   CHOLHDL 2.1 09/03/2023    Lab Results  Component Value Date   HGBA1C 5.7 (H) 09/04/2023   No results for input(s): TSH, T4TOTAL, FREET4, T3FREE, THYROIDAB in the last 72 hours. Recent Labs    12/17/23 0209  FERRITIN 115  TIBC 245*  IRON  22*   ------------------------------------------------------------------------------------------------------------------ Cardiac Enzymes No results for input(s): CKMB, TROPONINI, MYOGLOBIN in the last 168 hours.  Invalid input(s): CK  Micro Results Recent Results (from the past 240 hours)  Urine Culture     Status: None (Preliminary result)   Collection Time: 12/16/23 12:13 AM   Specimen: Urine, Clean Catch  Result Value Ref Range Status   Specimen Description URINE, CLEAN CATCH  Final   Special Requests NONE  Final   Culture   Final    CULTURE REINCUBATED FOR BETTER GROWTH Performed at [facility], [address] [place], [place] [address]    Report Status PENDING  Incomplete  Blood culture (routine x 2)     Status: None (Preliminary result)   Collection Time: 12/16/23 12:13 AM   Specimen: BLOOD  Result Value Ref Range Status   Specimen Description BLOOD SITE NOT SPECIFIED  Final   Special Requests   Final    BOTTLES DRAWN AEROBIC AND ANAEROBIC Blood Culture results may not be optimal due to an inadequate volume of blood received in culture bottles   Culture   Final    NO GROWTH 1 DAY Performed at [facility], [address], [place], [place] [address]    Report Status PENDING  Incomplete  Blood culture (routine x 2)     Status: None (Preliminary result)   Collection Time: 12/16/23 12:13 AM   Specimen: BLOOD  Result Value Ref Range Status   Specimen  Description BLOOD SITE NOT SPECIFIED  Final   Special Requests   Final    BOTTLES DRAWN AEROBIC AND ANAEROBIC Blood Culture adequate volume   Culture   Final    NO GROWTH 1  DAY Performed at [facility], [address], [place], [place] [address]    Report Status PENDING  Incomplete    Radiology Report CT LIVER ABDOMEN W WO CONTRAST Result Date: 12/16/2023 EXAM: CT ABDOMEN WITH AND WITHOUT CONTRAST 12/16/2023 08:01:00 PM TECHNIQUE: CT of the abdomen was performed with the administration of intravenous contrast. Multiplanar reformatted images are provided for review. Automated exposure control, iterative reconstruction, and/or weight based adjustment of the mA/kV was utilized to reduce the radiation dose to as low as reasonably achievable. COMPARISON: CT 12/15/2023 CLINICAL HISTORY: Liver lesion, > 1cm. FINDINGS: LOWER CHEST: Small right pleural effusion and pneumonia or atelectasis. HEPATOBILIARY: Redemonstrated hypoattenuating lesions throughout the liver with peripheral enhancement on the portal venous phase in the right base. The largest lesion is in the inferior right hepatic lobe measuring 7.9 cm (series 8 image 92). Differential considerations include metastases or hepatic abscesses. Cholecystectomy. SPLEEN: Spleen demonstrates no acute abnormality. PANCREAS: Pancreas demonstrates no acute abnormality. ADRENAL GLANDS: Adrenal glands demonstrate no acute abnormality. KIDNEYS: Nonobstructing left nephrolithiasis. No hydronephrosis. No perinephric or periureteral stranding. GI AND BOWEL: Stomach and duodenal sweep demonstrate no acute abnormality. There is no bowel obstruction. No abnormal bowel wall thickening or distension. PERITONEUM AND RETROPERITONEUM: No ascites or free air. Aorta is normal in caliber. IVC filter. LYMPH NODES: No lymphadenopathy. BONES AND SOFT TISSUES: No acute abnormality of the visualized bones. Body wall edema. IMPRESSION: 1. Hypoattenuating liver lesions with  peripheral enhancement, largest measuring 7.9 cm in the inferior right hepatic lobe concerning for metastases. Differential consideration includes hepatic abscesses. Hepatic protocol MRI with and without IV contrast is recommended. 2. Small right pleural effusion and RLL pneumonia or atelectasis. Electronically signed by: Norman Gatlin MD 12/16/2023 08:53 PM EDT RP Workstation: [id number]   CT Renal Stone Study Result Date: 12/15/2023 EXAM: CT UROGRAM 12/15/2023 11:01:37 PM TECHNIQUE: CT of the abdomen and pelvis was performed before and after the administration of intravenous contrast as per CT urogram protocol. Multiplanar reformatted images as well as MIP urogram images are provided for review. Automated exposure control, iterative reconstruction, and/or weight based adjustment of the mA/kV was utilized to reduce the radiation dose to as low as reasonably achievable. COMPARISON: CT 12/25/2012 CLINICAL HISTORY: Abdominal/flank pain, stone suspected. FINDINGS: LOWER CHEST: Small right pleural effusions. Right lower lobe airspace opacity suspicious for pneumonia. LIVER: Multiple hypoattenuating lesions throughout the liver were not previously seen and are concerning for metastases. These are incompletely assessed without IV contrast. The largest measures approximately 7.7 cm in the inferior right hepatic lobe (series 3, image 36). GALLBLADDER AND BILE DUCTS: Cholecystectomy. SPLEEN: No acute abnormality. PANCREAS: No acute abnormality. ADRENAL GLANDS: No acute abnormality. KIDNEYS, URETERS AND BLADDER: Nonobstructing left nephrolithiasis. No hydronephrosis. No perinephric or periureteral stranding. Urinary bladder is unremarkable. GI AND BOWEL: Stomach demonstrates no acute abnormality. There is no bowel obstruction. PERITONEUM AND RETROPERITONEUM: No ascites. No free air. VASCULATURE: Similar configuration of the IVC filter. Aortic atherosclerotic calcification. LYMPH NODES: No lymphadenopathy. REPRODUCTIVE  ORGANS: No acute abnormality. BONES AND SOFT TISSUES: No acute osseous abnormality. Diffuse edema in the bilateral flanks and anterior abdominal wall pannus. IMPRESSION: 1. Multiple hypoattenuating liver lesions concerning for metastases, incompletely assessed without IV contrast. Largest measures approximately 7.7 cm in the inferior right hepatic lobe. Further evaluation with CT with IV contrast is recommended. 2. Right lower lobe airspace opacity suspicious for pneumonia. 3. Nonobstructing left nephrolithiasis. No hydronephrosis. Electronically signed by: Norman Gatlin MD 12/15/2023 11:10 PM EDT RP Workstation: [id number]  DG Chest 2 View Result Date: 12/15/2023 CLINICAL DATA:  Chest pain. EXAM: CHEST - 2 VIEW COMPARISON:  Chest radiograph dated 11/11/2023 FINDINGS: There is cardiomegaly with vascular congestion. No focal consolidation, pleural effusion or pneumothorax. Left pectoral pacemaker device. Atherosclerotic calcification of the aorta. No acute osseous pathology. IMPRESSION: Cardiomegaly with vascular congestion.  Pneumonia is not excluded. Electronically Signed   By: Vanetta Chou M.D.   On: 12/15/2023 21:38     Signature  -   Lavada Stank M.D on 12/17/2023 at 10:48 AM   -  To page go to [email or web address]

## 2023-12-17 NOTE — TOC Initial Note (Signed)
 Transition of Care [facility]) - Initial/Assessment Note    Patient Details  Name: [name] MRN: [id number] Date of Birth: [birth year]  Transition of [facility]) CM/SW Contact:    Inocente GORMAN Kindle, LCSW Phone Number: 12/17/2023, 10:54 AM  Clinical Narrative:                 CSW confirmed that patient was admitted from [facility] where she was undergoing short term rehab. She will require insurance approval for return.    Expected Discharge Plan: Skilled Nursing Facility Barriers to Discharge: Continued Medical Work up, English as a second language teacher   Patient Goals and CMS Choice Patient states their goals for this hospitalization and ongoing recovery are:: Return to rehab          Expected Discharge Plan and Services In-house Referral: Clinical Social Work   Post Acute Care Choice: Skilled Nursing Facility Living arrangements for the past 2 months: Single Family Home                                      Prior Living Arrangements/Services Living arrangements for the past 2 months: Single Family Home Lives with:: Self Patient language and need for interpreter reviewed:: Yes Do you feel safe going back to the place where you live?: Yes      Need for Family Participation in Patient Care: Yes (Comment) Care giver support system in place?: Yes (comment) Current home services: DME Criminal Activity/Legal Involvement Pertinent to Current Situation/Hospitalization: No - Comment as needed  Activities of Daily Living   ADL Screening (condition at time of admission) Independently performs ADLs?: No Does the patient have a NEW difficulty with bathing/dressing/toileting/self-feeding that is expected to last >3 days?: No Does the patient have a NEW difficulty with getting in/out of bed, walking, or climbing stairs that is expected to last >3 days?: No Does the patient have a NEW difficulty with communication that is expected to last >3 days?: No Is the patient deaf or have  difficulty hearing?: No Does the patient have difficulty seeing, even when wearing glasses/contacts?: No Does the patient have difficulty concentrating, remembering, or making decisions?: No  Permission Sought/Granted Permission sought to share information with : Magazine features editor, Family Supports Permission granted to share information with : Yes, Verbal Permission Granted     Permission granted to share info w AGENCY: [facility]        Emotional Assessment Appearance:: Appears stated age     Orientation: : Oriented to Self, Oriented to Place, Oriented to  Time, Oriented to Situation Alcohol / Substance Use: Not Applicable Psych Involvement: No (comment)  Admission diagnosis:  CAP (community acquired pneumonia) [J18.9] Liver lesion [K76.9] Troponin level elevated [R79.89] Community acquired pneumonia of right lower lobe of lung [J18.9] Acute renal failure superimposed on stage 5 chronic kidney disease, not on chronic dialysis, unspecified acute renal failure type (HCC) [N17.9, N18.5] Patient Active Problem List   Diagnosis Date Noted   CAP (community acquired pneumonia) 12/16/2023   Renal cyst 12/14/2023   Abscess of right axilla 11/30/2023   Intractable nausea and vomiting 11/11/2023   Drug overdose, intentional, initial encounter (HCC) 11/11/2023   Polyuria 11/11/2023   CKD (chronic kidney disease) stage 4, GFR 15-29 ml/min (HCC) 11/11/2023   Infection 11/11/2023   Chronic venous insufficiency 11/10/2023   Lower extremity ulceration, unspecified laterality, limited to breakdown of skin (HCC) 11/10/2023   MDD (major depressive disorder),  recurrent severe, without psychosis (HCC) 11/05/2023   MDD (major depressive disorder), severe (HCC) 11/03/2023   Acute respiratory failure with hypoxia (HCC) 09/11/2023   Obesity hypoventilation syndrome (HCC) 09/07/2023   WHO group 3 pulmonary arterial hypertension (HCC) 09/07/2023   (HFpEF) heart failure with preserved  ejection fraction (HCC) 09/03/2023   Elevated troponin 09/03/2023   Subtherapeutic international normalized ratio (INR) 09/03/2023   Presence of permanent cardiac pacemaker 09/03/2023   History of DVT (deep vein thrombosis) 09/03/2023   Pain in left hip 01/07/2023   Pain in right knee 01/07/2023   Type 2 diabetes mellitus with stage 3b chronic kidney disease, with long-term current use of insulin  (HCC) 09/09/2021   Mixed diabetic hyperlipidemia associated with type 2 diabetes mellitus (HCC) 09/09/2021   Hypothyroidism 09/09/2021   Wound of left leg, initial encounter 09/09/2021   C6 cervical fracture (HCC) 09/01/2018   Atrial flutter (HCC) 09/01/2018   Paroxysmal atrial fibrillation (HCC) 09/01/2018   Syncope 08/29/2018   Symptomatic advanced heart block 08/29/2018   Acute on chronic diastolic CHF (congestive heart failure) (HCC)    COPD (chronic obstructive pulmonary disease) (HCC) 09/12/2015   Personal history of endocrine, metabolic or immunity disorder 87/72/7983   Decreased mobility 04/03/2015   Deep vein thrombosis (DVT) of both lower extremities (HCC) 02/24/2015   Chronic kidney disease, stage 3b (HCC) 02/24/2015   Long term current use of anticoagulant therapy 11/19/2014   Osteoarthritis 08/31/2014   Cellulitis of hand, right 08/31/2014   Tremors  05/07/2012   Obstructive sleep apnea 05/03/2012   Thrombocytopenia (HCC) 08/18/2011   Diastolic dysfunction, grade 2, EF 65-70% May 2013 08/14/2011   LBBB (left bundle branch block) 08/14/2011   Angina pectoris (HCC) 08/13/2011   Morbid obesity with BMI of 50.0-59.9, adult (HCC) 08/13/2011   Essential hypertension 08/13/2011   Anemia 08/13/2011   History of pulmonary embolism, recurrent, 3 seperate episodes. 08/13/2011   Presence of IVC filter, placed June 2011 after 3d PE 08/13/2011   Pulmonary HTN, Moderate  PA pressure 09/2009 08/13/2011   PCP:  Valma Carwin, MD Pharmacy:   [facility] [phone] - [place], [place] - [address] AT [facility] [address] [place] [address] Phone: [phone] Fax: [phone]     Social Drivers of Health (SDOH) Social History: SDOH Screenings   Food Insecurity: No Food Insecurity (12/16/2023)  Housing: Low Risk  (12/16/2023)  Recent Concern: Housing - High Risk (11/05/2023)  Transportation Needs: No Transportation Needs (12/16/2023)  Utilities: Not At Risk (12/16/2023)  Alcohol Screen: Low Risk  (11/05/2023)  Social Connections: Moderately Isolated (12/16/2023)  Tobacco Use: Medium Risk (12/01/2023)   SDOH Interventions:     Readmission Risk Interventions     No data to display

## 2023-12-17 NOTE — Progress Notes (Signed)
 [facility] NEPHROLOGY PROGRESS NOTE  Assessment/ Plan: Pt is a 68 y.o. 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   # Acute kidney injury on CKD stage IV likely due to reduced effective renal perfusion caused by CHF/infection/recent prolonged hospitalization versus progressive CKD.   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 Dennise at [facility], last seen in 06/2023.  Since then patient had hospitalization at [facility] related with abscess, CHF, AKI on CKD.  Now presents with fluid overload in the setting of withholding diuretics. -Treating with IV Lasix  with improvement of creatinine level.  Exam consistent with volume overload therefore adding a dose of metolazone  to augment diuresis. - Continue with strict ins and outs and daily lab.  No need for dialysis at this time however patient agreed with RRT when needed.   # Chronic hypoxic respiratory failure due to pneumonia, CHF: Currently on antibiotics and diuretics.   # CHF exacerbation with preserved EF: Recent echo reviewed with EF 60 to 65%.  Cath with nonobstructive CAD.   # Liver lesions consistent with metastasis: IR consulted for biopsy/drainage.   # Anemia: Iron  saturation 9% with low serum iron , she has no bacteremia.  I will order IV iron  and hold ESA while undergoing evaluation for liver mass.  Discussed with the primary team.  Subjective: Seen and examined at bedside.  The urine output is not recorded.  Denies nausea, vomiting, dysgeusia however continued to have some shortness of breath.  No event overnight. Objective Vital signs in last 24 hours: Vitals:   12/16/23  1500 12/16/23 1510 12/16/23 2030 12/17/23 0726  BP: (!) 146/52   (!) 133/37  Pulse: (!) 50   (!) 50  Resp: 20   12  Temp:  98.7 F (37.1 C) 97.6 F (36.4 C) 97.9 F (36.6 C)  TempSrc:   Oral Oral  SpO2: 100%   100%  Weight:      Height:       Weight change:  No intake or output data in the 24 hours ending 12/17/23 1100     Labs: RENAL PANEL Recent Labs  Lab 12/15/23 2054 12/17/23 0209  NA 136 137  K 4.6 4.9  CL 98 100  CO2 24 25  GLUCOSE 225* 125*  BUN 135* 135*  CREATININE 3.65* 3.38*  CALCIUM  8.6* 8.4*  PHOS  --  5.5*  ALBUMIN  3.1* 2.7*    Liver Function Tests: Recent Labs  Lab 12/15/23 2054 12/17/23 0209  AST 23  --   ALT 16  --   ALKPHOS 79  --   BILITOT 0.5  --   PROT 5.7*  --   ALBUMIN  3.1* 2.7*   No results for input(s): LIPASE, AMYLASE in the last 168 hours. No results for input(s): AMMONIA in the last 168 hours. CBC: Recent Labs    11/17/23 1113 11/18/23 1050 11/25/23 0630 11/26/23 0355 11/29/23 0857 12/02/23 0545 12/15/23 2054 12/17/23 0209  HGB  --    < > 7.9* 8.1* 8.0* 8.2* 8.7*  --   MCV  --    < > 105.1* 105.1* 103.1* 100.7* 104.6*  --   VITAMINB12 480  --   --   --   --   --   --   --  FERRITIN  --   --   --   --   --   --   --  115  TIBC 245*  --   --   --   --   --   --  245*  IRON  33  --   --   --   --   --   --  22*   < > = values in this interval not displayed.    Cardiac Enzymes: No results for input(s): CKTOTAL, CKMB, CKMBINDEX, TROPONINI in the last 168 hours. CBG: Recent Labs  Lab 12/16/23 1647 12/16/23 2104 12/17/23 0724  GLUCAP 199* 199* 118*    Iron  Studies:  Recent Labs    12/17/23 0209  IRON  22*  TIBC 245*  FERRITIN 115   Studies/Results: CT LIVER ABDOMEN W WO CONTRAST Result Date: 12/16/2023 EXAM: CT ABDOMEN WITH AND WITHOUT CONTRAST 12/16/2023 08:01:00 PM TECHNIQUE: CT of the abdomen was performed with the administration of intravenous contrast. Multiplanar reformatted images are  provided for review. Automated exposure control, iterative reconstruction, and/or weight based adjustment of the mA/kV was utilized to reduce the radiation dose to as low as reasonably achievable. COMPARISON: CT 12/15/2023 CLINICAL HISTORY: Liver lesion, > 1cm. FINDINGS: LOWER CHEST: Small right pleural effusion and pneumonia or atelectasis. HEPATOBILIARY: Redemonstrated hypoattenuating lesions throughout the liver with peripheral enhancement on the portal venous phase in the right base. The largest lesion is in the inferior right hepatic lobe measuring 7.9 cm (series 8 image 92). Differential considerations include metastases or hepatic abscesses. Cholecystectomy. SPLEEN: Spleen demonstrates no acute abnormality. PANCREAS: Pancreas demonstrates no acute abnormality. ADRENAL GLANDS: Adrenal glands demonstrate no acute abnormality. KIDNEYS: Nonobstructing left nephrolithiasis. No hydronephrosis. No perinephric or periureteral stranding. GI AND BOWEL: Stomach and duodenal sweep demonstrate no acute abnormality. There is no bowel obstruction. No abnormal bowel wall thickening or distension. PERITONEUM AND RETROPERITONEUM: No ascites or free air. Aorta is normal in caliber. IVC filter. LYMPH NODES: No lymphadenopathy. BONES AND SOFT TISSUES: No acute abnormality of the visualized bones. Body wall edema. IMPRESSION: 1. Hypoattenuating liver lesions with peripheral enhancement, largest measuring 7.9 cm in the inferior right hepatic lobe concerning for metastases. Differential consideration includes hepatic abscesses. Hepatic protocol MRI with and without IV contrast is recommended. 2. Small right pleural effusion and RLL pneumonia or atelectasis. Electronically signed by: Norman Gatlin MD 12/16/2023 08:53 PM EDT RP Workstation: [id number]   CT Renal Stone Study Result Date: 12/15/2023 EXAM: CT UROGRAM 12/15/2023 11:01:37 PM TECHNIQUE: CT of the abdomen and pelvis was performed before and after the administration of  intravenous contrast as per CT urogram protocol. Multiplanar reformatted images as well as MIP urogram images are provided for review. Automated exposure control, iterative reconstruction, and/or weight based adjustment of the mA/kV was utilized to reduce the radiation dose to as low as reasonably achievable. COMPARISON: CT 12/25/2012 CLINICAL HISTORY: Abdominal/flank pain, stone suspected. FINDINGS: LOWER CHEST: Small right pleural effusions. Right lower lobe airspace opacity suspicious for pneumonia. LIVER: Multiple hypoattenuating lesions throughout the liver were not previously seen and are concerning for metastases. These are incompletely assessed without IV contrast. The largest measures approximately 7.7 cm in the inferior right hepatic lobe (series 3, image 36). GALLBLADDER AND BILE DUCTS: Cholecystectomy. SPLEEN: No acute abnormality. PANCREAS: No acute abnormality. ADRENAL GLANDS: No acute abnormality. KIDNEYS, URETERS AND BLADDER: Nonobstructing left nephrolithiasis. No hydronephrosis. No perinephric or periureteral stranding. Urinary bladder is unremarkable. GI AND BOWEL: Stomach demonstrates no acute abnormality. There is no bowel obstruction.  PERITONEUM AND RETROPERITONEUM: No ascites. No free air. VASCULATURE: Similar configuration of the IVC filter. Aortic atherosclerotic calcification. LYMPH NODES: No lymphadenopathy. REPRODUCTIVE ORGANS: No acute abnormality. BONES AND SOFT TISSUES: No acute osseous abnormality. Diffuse edema in the bilateral flanks and anterior abdominal wall pannus. IMPRESSION: 1. Multiple hypoattenuating liver lesions concerning for metastases, incompletely assessed without IV contrast. Largest measures approximately 7.7 cm in the inferior right hepatic lobe. Further evaluation with CT with IV contrast is recommended. 2. Right lower lobe airspace opacity suspicious for pneumonia. 3. Nonobstructing left nephrolithiasis. No hydronephrosis. Electronically signed by: Norman Gatlin  MD 12/15/2023 11:10 PM EDT RP Workstation: [id number]   DG Chest 2 View Result Date: 12/15/2023 CLINICAL DATA:  Chest pain. EXAM: CHEST - 2 VIEW COMPARISON:  Chest radiograph dated 11/11/2023 FINDINGS: There is cardiomegaly with vascular congestion. No focal consolidation, pleural effusion or pneumothorax. Left pectoral pacemaker device. Atherosclerotic calcification of the aorta. No acute osseous pathology. IMPRESSION: Cardiomegaly with vascular congestion.  Pneumonia is not excluded. Electronically Signed   By: Vanetta Chou M.D.   On: 12/15/2023 21:38    Medications: Infusions:  cefTRIAXone  (ROCEPHIN )  IV 1 g (12/17/23 1018)   heparin       Scheduled Medications:  acetaminophen   1,000 mg Oral TID   amLODipine   10 mg Oral Daily   ARIPiprazole   2 mg Oral QHS   azithromycin   500 mg Oral Daily   citalopram   40 mg Oral QHS   furosemide   80 mg Intravenous BID   furosemide   80 mg Intravenous BID   insulin  aspart  0-15 Units Subcutaneous TID WC   insulin  glargine  20 Units Subcutaneous Daily   levothyroxine   100 mcg Oral QAC breakfast   melatonin  10 mg Oral QHS   rosuvastatin   10 mg Oral Daily    have reviewed scheduled and prn medications.  Physical Exam: General:NAD, comfortable Heart:RRR, s1s2 nl Lungs: Reduced bilateral breath sound Abdomen:soft, Non-tender, non-distended Extremities: Bilateral lower extremities edema, chronic venous stasis changes Neurology: Alert, awake and following command  Aasir Daigler Prasad Nijah Orlich 12/17/2023,11:00 AM  LOS: 0 days

## 2023-12-18 ENCOUNTER — Encounter (HOSPITAL_COMMUNITY): Payer: Self-pay | Admitting: Internal Medicine

## 2023-12-18 DIAGNOSIS — J189 Pneumonia, unspecified organism: Secondary | ICD-10-CM | POA: Diagnosis not present

## 2023-12-18 LAB — CBC WITH DIFFERENTIAL/PLATELET
Abs Immature Granulocytes: 0.03 K/uL (ref 0.00–0.07)
Basophils Absolute: 0 K/uL (ref 0.0–0.1)
Basophils Relative: 0 %
Eosinophils Absolute: 0.2 K/uL (ref 0.0–0.5)
Eosinophils Relative: 3 %
HCT: 25.6 % — ABNORMAL LOW (ref 36.0–46.0)
Hemoglobin: 7.7 g/dL — ABNORMAL LOW (ref 12.0–15.0)
Immature Granulocytes: 1 %
Lymphocytes Relative: 8 %
Lymphs Abs: 0.5 K/uL — ABNORMAL LOW (ref 0.7–4.0)
MCH: 30.7 pg (ref 26.0–34.0)
MCHC: 30.1 g/dL (ref 30.0–36.0)
MCV: 102 fL — ABNORMAL HIGH (ref 80.0–100.0)
Monocytes Absolute: 0.6 K/uL (ref 0.1–1.0)
Monocytes Relative: 11 %
Neutro Abs: 4.7 K/uL (ref 1.7–7.7)
Neutrophils Relative %: 77 %
Platelets: 133 K/uL — ABNORMAL LOW (ref 150–400)
RBC: 2.51 MIL/uL — ABNORMAL LOW (ref 3.87–5.11)
RDW: 16.7 % — ABNORMAL HIGH (ref 11.5–15.5)
WBC: 6 K/uL (ref 4.0–10.5)
nRBC: 0 % (ref 0.0–0.2)

## 2023-12-18 LAB — COMPREHENSIVE METABOLIC PANEL WITH GFR
ALT: 14 U/L (ref 0–44)
AST: 17 U/L (ref 15–41)
Albumin: 2.6 g/dL — ABNORMAL LOW (ref 3.5–5.0)
Alkaline Phosphatase: 63 U/L (ref 38–126)
Anion gap: 12 (ref 5–15)
BUN: 138 mg/dL — ABNORMAL HIGH (ref 8–23)
CO2: 23 mmol/L (ref 22–32)
Calcium: 8.4 mg/dL — ABNORMAL LOW (ref 8.9–10.3)
Chloride: 100 mmol/L (ref 98–111)
Creatinine, Ser: 3.46 mg/dL — ABNORMAL HIGH (ref 0.44–1.00)
GFR, Estimated: 14 mL/min — ABNORMAL LOW (ref >60)
Glucose, Bld: 125 mg/dL — ABNORMAL HIGH (ref 70–99)
Potassium: 4.8 mmol/L (ref 3.5–5.1)
Sodium: 135 mmol/L (ref 135–145)
Total Bilirubin: 0.6 mg/dL (ref 0.0–1.2)
Total Protein: 5.1 g/dL — ABNORMAL LOW (ref 6.5–8.1)

## 2023-12-18 LAB — CANCER ANTIGEN 19-9: CA 19-9: 1968 U/mL — ABNORMAL HIGH (ref 0–35)

## 2023-12-18 LAB — HEPARIN LEVEL (UNFRACTIONATED): Heparin Unfractionated: >1.1 [IU]/mL — ABNORMAL HIGH (ref 0.30–0.70)

## 2023-12-18 LAB — GLUCOSE, CAPILLARY
Glucose-Capillary: 128 mg/dL — ABNORMAL HIGH (ref 70–99)
Glucose-Capillary: 119 mg/dL — ABNORMAL HIGH (ref 70–99)
Glucose-Capillary: 174 mg/dL — ABNORMAL HIGH (ref 70–99)
Glucose-Capillary: 152 mg/dL — ABNORMAL HIGH (ref 70–99)
Glucose-Capillary: 134 mg/dL — ABNORMAL HIGH (ref 70–99)

## 2023-12-18 LAB — APTT: aPTT: 83 s — ABNORMAL HIGH (ref 24–36)

## 2023-12-18 LAB — CEA: CEA: 17.2 ng/mL — ABNORMAL HIGH (ref 0.0–4.7)

## 2023-12-18 LAB — MAGNESIUM: Magnesium: 2.7 mg/dL — ABNORMAL HIGH (ref 1.7–2.4)

## 2023-12-18 MED ORDER — CHLORHEXIDINE GLUCONATE CLOTH 2 % EX PADS
6.0000 | MEDICATED_PAD | Freq: Every day | CUTANEOUS | Status: DC
  Administered 2023-12-18 – 2023-12-21 (×4): 6 via TOPICAL

## 2023-12-18 MED ORDER — ALBUMIN HUMAN 25 % IV SOLN
25.0000 g | Freq: Four times a day (QID) | INTRAVENOUS | Status: AC
  Administered 2023-12-18: 25 g via INTRAVENOUS
  Administered 2023-12-18: 12.5 g via INTRAVENOUS
  Filled 2023-12-18 (×2): qty 100

## 2023-12-18 MED ORDER — METOLAZONE 5 MG PO TABS
10.0000 mg | ORAL_TABLET | Freq: Once | ORAL | Status: AC
  Administered 2023-12-18: 10 mg via ORAL
  Filled 2023-12-18: qty 2

## 2023-12-18 NOTE — Plan of Care (Signed)

## 2023-12-18 NOTE — Evaluation (Signed)
 Occupational Therapy Evaluation Patient Details Name: [name] MRN: [id number] DOB: [birth year] Today's Date: 12/18/2023   History of Present Illness   Pt is a 68 y.o. female who presented 12/15/23 with SOB and volume overload secondary to LLL CAP and HFpEF exacerbation. Pt also with worsening renal failure and found to have liver lesions concerning for metastases. Pt recently admitted 7/28 - 8/31 for intentional overdose of OxyContin . PMH: HTN, afib, heart block s/p PPM, diastolic CHF, DM2 CKD stage IIIb, DVT/PE x 3, s/p IVC filter on Coumadin , hypothyroidism, OSA, HLD, anemia, obesity, pulmonary hypertension type III, gout     Clinical Impressions At baseline, pt is Ind to Mod I with ADLs, IADLs, and functional mobility with a cane or walker. However, pt was receiving rehab just prior to this admission due to recent hospital admission. Pt now presents with decreased activity tolerance, impaired cardiopulmonary status, generalized B UE weakness, pain affecting functional level, and decreased safety and independence with functional tasks. Pt currently demonstrates ability to complete ADLs largely Independent to Max assist without the use of AE and bed mobility with Min assist. Pt declined further mobility this session due to pain and fatigue. Pt HR in the 50s and O2 sat >/97% on 2L continuous O2 through nasal cannula throughout session. Pt will benefit from acute OT services to address deficits and increase safety and independence with functional tasks. Post acute discharge, pt will benefit from intensive inpatient skilled rehab services < 3 hours per day.      If plan is discharge home, recommend the following:   A little help with walking and/or transfers;A lot of help with bathing/dressing/bathroom;Assistance with cooking/housework;Assist for transportation;Help with stairs or ramp for entrance     Functional Status Assessment   Patient has had a recent decline in their functional  status and demonstrates the ability to make significant improvements in function in a reasonable and predictable amount of time.     Equipment Recommendations   Other (comment) (defer to next level of care)     Recommendations for Other Services         Precautions/Restrictions   Precautions Precautions: Fall Recall of Precautions/Restrictions: Intact Precaution/Restrictions Comments: watch SpO2; chronic lower leg wounds Restrictions Weight Bearing Restrictions Per Provider Order: No     Mobility Bed Mobility Overal bed mobility: Needs Assistance Bed Mobility: Supine to Sit, Sit to Supine     Supine to sit: Min assist, HOB elevated, Used rails Sit to supine: Min assist, HOB elevated, Used rails   General bed mobility comments: Pt needed minA HHA to pull trunk up to sit R EOB, using bed rail as well to pull up to sit. MinA needed to lift legs back onto bed with return to supine.    Transfers Overall transfer level: Needs assistance Equipment used: Rolling walker (2 wheels) Transfers: Sit to/from Stand Sit to Stand: Min assist, Contact guard assist                  Balance Overall balance assessment: Needs assistance Sitting-balance support: No upper extremity supported, Feet supported Sitting balance-[name]: Fair Sitting balance - Comments: sits statically EOB with supervision   Standing balance support: Bilateral upper extremity supported, During functional activity, Reliant on assistive device for balance Standing balance-[name]: Poor                             ADL either performed or assessed with clinical judgement  ADL Overall ADL's : Needs assistance/impaired Eating/Feeding: Independent;Sitting   Grooming: Set up;Sitting   Upper Body Bathing: Set up;Sitting   Lower Body Bathing: Maximal assistance;Sitting/lateral leans;Sit to/from stand   Upper Body Dressing : Contact guard assist;Sitting   Lower Body Dressing:  Maximal assistance;Sitting/lateral leans;Sit to/from stand       Toileting- Architect and Hygiene: Maximal assistance;Sit to/from stand         General ADL Comments: pt declined functional transfer/mobility this session due to fatigue/pain     Vision   Vision Assessment?: No apparent visual deficits Additional Comments: Vision [facility] for tasks assessed; not formally screened or assessed     Perception         Praxis         Pertinent Vitals/Pain Pain Assessment Pain Assessment: Faces Faces Pain Scale: Hurts even more Pain Location: generalized; worse in B legs and genital area due to catheter placement earlier this day Pain Descriptors / Indicators: Discomfort, Grimacing, Guarding, Sore Pain Intervention(s): Limited activity within patient's tolerance, Monitored during session, Repositioned     Extremity/Trunk Assessment Upper Extremity Assessment Upper Extremity Assessment: Right hand dominant;Generalized weakness   Lower Extremity Assessment Lower Extremity Assessment: Defer to PT evaluation   Cervical / Trunk Assessment Cervical / Trunk Assessment: Other exceptions Cervical / Trunk Exceptions: increased body habitus   Communication Communication Communication: No apparent difficulties   Cognition Arousal: Alert Behavior During Therapy: WFL for tasks assessed/performed Cognition: No apparent impairments             OT - Cognition Comments: AAOx4 and pleasant throughout session. Cogntiion [facility] for tasks assessed. Cognition not formally screened or evaluated.                 Following commands: Intact       Cueing  General Comments   Cueing Techniques: Verbal cues;Tactile cues  Pt HR in the 50s and O2 sat >/97% on 2L continuous O2 through nasal cannula throughout session. RN present during a portion of session.   Exercises     Shoulder Instructions      Home Living Family/patient expects to be discharged to:: Skilled nursing  facility Living Arrangements: Alone Available Help at Discharge: Neighbor;Available PRN/intermittently Type of Home: House Home Access: Stairs to enter [employer] of Steps: 4 Entrance Stairs-Rails: Right;Left Home Layout: One level     Bathroom Shower/Tub: Walk-in shower         Home Equipment: Shower seat;Hand held shower head;Grab bars - tub/shower;Rollator (4 wheels);Rolling Walker (2 wheels);Cane - single point;Adaptive equipment;Other (comment) (Home O2 equipment) Adaptive Equipment: Reacher;Sock aid;Long-handled sponge;Other (Comment) (toileting aid) Additional Comments: Pt reports she is on 2L continuous O2 at home. Above is for pt's house; however, pt plans to got to SNF for short-term rehab and states she is thinking about transitioning to ALF or long-term care following short-term rehab      Prior Functioning/Environment Prior Level of Function : Independent/Modified Independent             Mobility Comments: Prior to June 2025, pt was Mod I for functional mobility with cane or walker. Pt reports a steady decline in funciton since that time. Pt was recently in SNF for short term rehab where she was ambulating to/from the bathroom with a RW with Mod I. ADLs Comments: Prior to June 2025, pt was Ind with ADLs and IADls including yard work and taking care of her dog. Since that time, pt reports a steady decline and has had to  rehome her dog. Pt reports while at SNF for short-term rehab, pt was performing all ADLs Mod I with significantly increased time and use of AE for LB ADLs.    OT Problem List: Decreased strength;Decreased activity tolerance;Impaired balance (sitting and/or standing);Cardiopulmonary status limiting activity;Pain   OT Treatment/Interventions: Self-care/ADL training;Therapeutic exercise;Energy conservation;DME and/or AE instruction;Therapeutic activities;Patient/family education;Balance training      OT Goals(Current goals can be found in the  care plan section)   Acute Rehab OT Goals Patient Stated Goal: to be able to walk to and from the bathroom without a rest break OT Goal Formulation: With patient Time For Goal Achievement: 01/01/24 Potential to Achieve Goals: Good ADL Goals Pt Will Perform Lower Body Bathing: with supervision;with adaptive equipment;sitting/lateral leans;sit to/from stand Pt Will Perform Lower Body Dressing: with supervision;with adaptive equipment;sitting/lateral leans;sit to/from stand Pt Will Transfer to Toilet: with supervision;ambulating;bedside commode (with least restrictive AD) Pt Will Perform Toileting - Clothing Manipulation and hygiene: with supervision;sitting/lateral leans;sit to/from stand;with adaptive equipment Pt/caregiver will Perform Home Exercise Program: Increased strength;Both right and left upper extremity;With theraband;Independently;With written HEP provided (increased activity tolerance) Additional ADL Goal #1: Patient will tolerate 3 or more minutes of a functional or therapeutic activity without the need for a rest break.   OT Frequency:  Min 1X/week    Co-evaluation              AM-PAC OT 6 Clicks Daily Activity     Outcome Measure Help from another person eating meals?: None Help from another person taking care of personal grooming?: A Little Help from another person toileting, which includes using toliet, bedpan, or urinal?: A Lot Help from another person bathing (including washing, rinsing, drying)?: A Lot Help from another person to put on and taking off regular upper body clothing?: A Little Help from another person to put on and taking off regular lower body clothing?: A Lot 6 Click Score: 16   End of Session Equipment Utilized During Treatment: Oxygen (2L continuous O2 through [employer]) Nurse Communication: Mobility status  Activity Tolerance: Patient tolerated treatment well;Patient limited by fatigue;Patient limited by pain Patient left: in bed;with call  bell/phone within reach;with bed alarm set  OT Visit Diagnosis: Other abnormalities of gait and mobility (R26.89);Muscle weakness (generalized) (M62.81);Pain                Time: 8643-8580 OT Time Calculation (min): 23 min Charges:  OT General Charges $OT Visit: 1 Visit OT Evaluation $OT Eval Moderate Complexity: 1 Mod  [name], OTR/L, MA Acute Rehab [phone]   [name] Horns 12/18/2023, 3:27 PM

## 2023-12-18 NOTE — Progress Notes (Signed)
 PHARMACY - ANTICOAGULATION CONSULT NOTE  Pharmacy Consult for Heparin  drip Indication: atrial fibrillation  Allergies  Allergen Reactions   Ms Contin [Morphine] Hives, Rash and Other (See Comments)    Broke out in brown spots all over.     Patient Measurements: Height: 5' 1 (154.9 cm) Weight: (!) 138.8 kg (306 lb) IBW/kg (Calculated) : 47.8 HEPARIN  DW (KG): 83.5  Vital Signs: Temp: 97.6 F (36.4 C) (09/11 2000) Temp Source: Oral (09/11 2000)  Labs: Recent Labs    12/15/23 2054 12/15/23 2333 12/17/23 0209 12/17/23 1924 12/18/23 0248  HGB 8.7*  --   --   --  7.7*  HCT 29.8*  --   --   --  25.6*  PLT 154  --   --   --  133*  APTT  --   --   --  75* 83*  LABPROT  --   --  24.6*  --   --   INR  --   --  2.1*  --   --   HEPARINUNFRC  --   --   --  >1.10* >1.10*  CREATININE 3.65*  --  3.38*  --  3.46*  TROPONINIHS 124* 105*  --   --   --     Estimated Creatinine Clearance: 21 mL/min (A) (by C-G formula based on SCr of 3.46 mg/dL (H)).   Medical History: Past Medical History:  Diagnosis Date   A-fib (HCC)    Anemia    Anticoagulated on Coumadin , chronically 09/03/2011   Anxiety    Arthritis    right hip; both knees; left wrist/shoulder; back (01/19/2013   Bleeding on Coumadin  08/2012; 01/18/2013   [facility] admissions (01/19/2013)   CHF (congestive heart failure) (HCC)    2-3 times (01/19/2013)   Chronic lower back pain    Depression    DVT (deep venous thrombosis) (HCC) 10 years ago   numerous/notes 01/18/2013   GERD (gastroesophageal reflux disease)    Gout    Headache(784.0)    maybe weekly (01/19/2013)   Heart murmur    High cholesterol    been off RX for this at one time (01/18/2013)   History of blood transfusion 1983; 04/2012   3 w/ childbirth; hospitalized for pain (01/19/2013)   Hypertension    Hypothyroidism    Migraines    twice/yr maybe (01/19/2013)   Obstructive sleep apnea 05/03/2012   OSA (obstructive sleep apnea)    sent me  for test in 04/2012; never ordered mask, etc (01/19/2013)   PE (pulmonary thromboembolism) (HCC) 3 years ag0   3/notes 01/18/2013   Pneumonia before 2011   once' (01/18/2013)   Renal disorder    kindey function low; Metformin was destroying my kidneys (01/19/2013)   Shortness of breath    only related to my CHF (01/18/2013)   Swelling of hand 08/31/2014   RT HAND   Type II diabetes mellitus (HCC)    UTI (urinary tract infection) 08/31/2014    Medications:  Medications Prior to Admission  Medication Sig Dispense Refill Last Dose/Taking   allopurinol  (ZYLOPRIM ) 100 MG tablet Take 50 mg by mouth every other day.   12/15/2023   amLODipine  (NORVASC ) 5 MG tablet Take 2 tablets (10 mg total) by mouth daily.   12/15/2023   apixaban  (ELIQUIS ) 5 MG TABS tablet Take 5 mg by mouth 2 (two) times daily.   12/15/2023   ARIPiprazole  (ABILIFY ) 2 MG tablet Take 1 tablet (2 mg total) by mouth at bedtime.  12/15/2023   citalopram  (CELEXA ) 40 MG tablet Take 40 mg by mouth at bedtime.   12/15/2023   diazepam  (VALIUM ) 5 MG tablet Take 1 tablet (5 mg total) by mouth 2 (two) times daily as needed for anxiety or muscle spasms. 10 tablet 0 12/15/2023   insulin  glargine (LANTUS  SOLOSTAR) 100 UNIT/ML Solostar Pen Inject 20 Units into the skin daily.   12/15/2023   insulin  lispro (HUMALOG) 100 UNIT/ML injection Inject 5 Units into the skin 3 (three) times daily before meals.   12/15/2023   iron  polysaccharides (NIFEREX) 150 MG capsule Take 1 capsule (150 mg total) by mouth daily.   12/15/2023   levothyroxine  (SYNTHROID ) 100 MCG tablet Take 1 tablet (100 mcg total) by mouth daily before breakfast.   12/15/2023   Melatonin 10 MG TABS Take 10 mg by mouth.   12/15/2023   Multiple Vitamins-Minerals (CENTRUM SILVER) CHEW Chew 2 each by mouth daily.   12/15/2023   rosuvastatin  (CRESTOR ) 10 MG tablet Take 1 tablet (10 mg total) by mouth daily. 30 tablet 0 12/15/2023   sennosides-docusate sodium  (SENOKOT-S) 8.6-50 MG tablet Take 2 tablets by  mouth daily.   12/15/2023   insulin  aspart (NOVOLOG ) 100 UNIT/ML injection Inject 5 Units into the skin 3 (three) times daily with meals. (Patient not taking: Reported on 12/17/2023)   Not Taking    Assessment: 68 yo female with h/o PAF on apixaban  PTA admitted with AKI. MD wishes to convert to heparin  drip at this time. Last dose of apixaban  9/10 at ~1600.   aPTT this am is therapeutic at 83 (HL > 1.1). No issues with bleeding or infusion reported.  Goal of Therapy:  Heparin  level 0.3-0.7 units/ml aPTT 66-102 seconds Monitor platelets by anticoagulation protocol: Yes   Plan:  Continue Heparin  drip at 1200 units/hr Continue aPTTs until heparin  levels correlate Daily CBC, Heparin  level and APTT (for now)   Thank you for allowing pharmacy to be a part of this patient's care.   Donny Alert, PharmD, [facility] Clinical Pharmacist Please see AMION for all Pharmacists' Contact Phone Numbers 12/18/2023, 6:59 AM    **Pharmacist phone directory can be found on [email or web address] listed under [facility]**

## 2023-12-18 NOTE — Progress Notes (Signed)
 [facility] NEPHROLOGY PROGRESS NOTE  Assessment/ Plan: Pt is a 68 y.o. 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   # Acute kidney injury on CKD stage IV likely due to reduced effective renal perfusion caused by CHF/infection/recent prolonged hospitalization versus progressive CKD.   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 Dennise at [facility], last seen in 06/2023.  Since then patient had hospitalization at [facility] related with abscess, CHF, AKI on CKD.  Now presents with fluid overload in the setting of withholding diuretics. -Continue IV Lasix  and metolazone .  No urine output is recorded therefore discussed about strict ins and outs and bladder scan with the patient's nurse.  - Continue with strict ins and outs and daily lab.  No need for dialysis at this time however patient agreed with RRT when needed.   # Chronic hypoxic respiratory failure due to pneumonia, CHF: Currently on antibiotics and diuretics.   # CHF exacerbation with preserved EF: Recent echo reviewed with EF 60 to 65%.  Cath with nonobstructive CAD.   # Liver lesions consistent with metastasis: IR consulted for biopsy/drainage likely early next week.   # Anemia: Iron  saturation 9% with low serum iron , she has no bacteremia.  I will order IV iron  and hold ESA while undergoing evaluation for liver mass.  Discussed with the primary team.  Subjective: Seen and examined at bedside.  No urine output is recorded.  Patient states that she feels like holding urine.  Discussed with nurse to do bladder scan.  Denies nausea, vomiting, chest pain, shortness of  breath. Objective Vital signs in last 24 hours: Vitals:   12/17/23 1600 12/17/23 2000 12/18/23 0731 12/18/23 1140  BP: (!) 120/44  (!) 115/38 (!) 138/45  Pulse: (!) 50  (!) 50 (!) 50  Resp: 18  17 16   Temp: 97.6 F (36.4 C) 97.6 F (36.4 C) 97.8 F (36.6 C) 98.7 F (37.1 C)  TempSrc: Oral Oral Oral Oral  SpO2: 99%  100% 99%  Weight:      Height:       Weight change:   Intake/Output Summary (Last 24 hours) at 12/18/2023 1202 Last data filed at 12/18/2023 1144 Gross per 24 hour  Intake --  Output 825 ml  Net -825 ml       Labs: RENAL PANEL Recent Labs  Lab 12/15/23 2054 12/17/23 0209 12/18/23 0248  NA 136 137 135  K 4.6 4.9 4.8  CL 98 100 100  CO2 24 25 23   GLUCOSE 225* 125* 125*  BUN 135* 135* 138*  CREATININE 3.65* 3.38* 3.46*  CALCIUM  8.6* 8.4* 8.4*  MG  --   --  2.7*  PHOS  --  5.5*  --   ALBUMIN  3.1* 2.7* 2.6*    Liver Function Tests: Recent Labs  Lab 12/15/23 2054 12/17/23 0209 12/18/23 0248  AST 23  --  17  ALT 16  --  14  ALKPHOS 79  --  63  BILITOT 0.5  --  0.6  PROT 5.7*  --  5.1*  ALBUMIN  3.1* 2.7* 2.6*   No results for input(s): LIPASE, AMYLASE in the last 168 hours.  No results for input(s): AMMONIA in the last 168 hours. CBC: Recent Labs    11/17/23 1113 11/18/23 1050 11/26/23 0355 11/29/23 0857 12/02/23 0545 12/15/23 2054 12/17/23 0209 12/18/23 0248  HGB  --    < > 8.1* 8.0* 8.2* 8.7*  --  7.7*  MCV  --    < > 105.1* 103.1* 100.7* 104.6*  --  102.0*  VITAMINB12 480  --   --   --   --   --   --   --   FERRITIN  --   --   --   --   --   --  115  --   TIBC 245*  --   --   --   --   --  245*  --   IRON  33  --   --   --   --   --  22*  --    < > = values in this interval not displayed.    Cardiac Enzymes: No results for input(s): CKTOTAL, CKMB, CKMBINDEX, TROPONINI in the last 168 hours. CBG: Recent Labs  Lab 12/17/23 1559 12/17/23 2110 12/18/23 0730 12/18/23 0854 12/18/23 1146  GLUCAP 213* 144* 128*  119* 174*    Iron  Studies:  Recent Labs    12/17/23 0209  IRON  22*  TIBC 245*  FERRITIN 115   Studies/Results: CT LIVER ABDOMEN W WO CONTRAST Result Date: 12/16/2023 EXAM: CT ABDOMEN WITH AND WITHOUT CONTRAST 12/16/2023 08:01:00 PM TECHNIQUE: CT of the abdomen was performed with the administration of intravenous contrast. Multiplanar reformatted images are provided for review. Automated exposure control, iterative reconstruction, and/or weight based adjustment of the mA/kV was utilized to reduce the radiation dose to as low as reasonably achievable. COMPARISON: CT 12/15/2023 CLINICAL HISTORY: Liver lesion, > 1cm. FINDINGS: LOWER CHEST: Small right pleural effusion and pneumonia or atelectasis. HEPATOBILIARY: Redemonstrated hypoattenuating lesions throughout the liver with peripheral enhancement on the portal venous phase in the right base. The largest lesion is in the inferior right hepatic lobe measuring 7.9 cm (series 8 image 92). Differential considerations include metastases or hepatic abscesses. Cholecystectomy. SPLEEN: Spleen demonstrates no acute abnormality. PANCREAS: Pancreas demonstrates no acute abnormality. ADRENAL GLANDS: Adrenal glands demonstrate no acute abnormality. KIDNEYS: Nonobstructing left nephrolithiasis. No hydronephrosis. No perinephric or periureteral stranding. GI AND BOWEL: Stomach and duodenal sweep demonstrate no acute abnormality. There is no bowel obstruction. No abnormal bowel wall thickening or distension. PERITONEUM AND RETROPERITONEUM: No ascites or free air. Aorta is normal in caliber. IVC filter. LYMPH NODES: No lymphadenopathy. BONES AND SOFT TISSUES: No acute abnormality of the visualized bones. Body wall edema. IMPRESSION: 1. Hypoattenuating liver lesions with peripheral enhancement, largest measuring 7.9 cm in the inferior right hepatic lobe concerning for metastases. Differential consideration includes hepatic abscesses. Hepatic protocol MRI with and without IV  contrast is recommended. 2. Small right pleural effusion and RLL pneumonia or atelectasis. Electronically signed by: Norman Gatlin MD 12/16/2023 08:53 PM EDT RP Workstation: [id number]    Medications: Infusions:  cefTRIAXone  (ROCEPHIN )  IV 1 g (12/18/23 0936)   heparin  1,200 Units/hr (12/18/23 0559)   iron  sucrose 200 mg (12/17/23 1312)   promethazine  (PHENERGAN ) injection (IM or IVPB) 12.5 mg (12/17/23 1722)    Scheduled Medications:  acetaminophen   1,000 mg Oral TID   amLODipine   10 mg Oral Daily   ARIPiprazole   2 mg Oral QHS   Chlorhexidine  Gluconate Cloth  6 each Topical Daily   citalopram   20 mg Oral QHS   furosemide   80 mg Intravenous BID   insulin  aspart  0-15 Units Subcutaneous TID WC   insulin  glargine  20 Units Subcutaneous Daily   levothyroxine   100 mcg Oral QAC breakfast   melatonin  10 mg Oral QHS   rosuvastatin   10 mg Oral Daily    have reviewed scheduled and prn medications.  Physical Exam: General:NAD, comfortable Heart:RRR, s1s2 nl Lungs: Reduced bilateral breath sound Abdomen:soft, Non-tender, non-distended Extremities: Bilateral lower extremities edema, chronic venous stasis changes Neurology: Alert, awake and following command  [name] [name] 12/18/2023,12:02 PM  LOS: 1 day

## 2023-12-18 NOTE — Progress Notes (Signed)
 PROGRESS NOTE                                                                                                                                                                                                             Patient Demographics:    [name], is a 68 y.o. female, DOB - May 05, 1955, FMW:[id number]  Outpatient Primary MD for the patient is Valma Carwin, MD    LOS - 1  Admit date - 12/15/2023    Chief Complaint  Patient presents with   Chest Pain       Brief Narrative (HPI from H&P)     68 y.o. female long-term resident at SNF with medical history significant of pulmonary hypertension, chronic respiratory failure requiring 2L [place] at baseline, Afib on Eliquis , HFpEF (EF 60-65% in 08/2023), DM2 (A1c 5.7 in 08/2023), DVT/PE x3 s/p IVC filter in 09/2009, CKD stage IV, and morbid obesity p/w SOB/cp iso volume overload 2/2 LLL CAP and HFpEF exacerbation c/b worsening renal failure and found to have liver lesions c/f metastases.    Subjective:   Patient in bed, appears comfortable, denies any headache, no fever, no chest pain or pressure, no shortness of breath , no abdominal pain. No new focal weakness.    Assessment  & Plan :   Respiratory insufficiency iso volume overload 2/2 LLL CAP and HFpEF exacerbation c/b worsening renal failure   Multifactorial SOB/cp but likely exacerbated by volume overload iso worsening renal failure  has significant volume overload, placed on IV diuretics per nephrology, shortness of breath and chest discomfort improved, continue to monitor with ongoing diuresis.   AKI on CKD (chronic kidney disease) stage 4, GFR 15-29 ml/min   Per nephrology   Pneumonia on admission.  Empiric antibiotics.  Speech eval.  Supportive care with oxygen and nebulizer treatments.  Follow cultures.  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  Follow-up with PCP for weight loss.   BMI of 57  HFpEF EF 60% on recent echocardiogram recent [facility] noted no significant CAD, PA pressure 62/16, wedge of 11, PVR 4.4, group 3 pulmonary hypertension suspected, Diuresis per nephrology.   Liver lesions c/f metastases  - IR consulted patient was on Eliquis  biopsy hencebiopsy is scheduled for  12/21/23 .  Will check CA 19.9 and CEA levels as well.   Paroxysmal  Afib  -anticoagulation currently heparin  drip post biopsy switch back to Eliquis    Hypothyroid  -PTA Synthroid    HTN  -PTA amlodipine    Chronic lymphedema - Venous stasis ulcer of LLE  -Continue wrapping BLE and Unna boots to BLE as well   MDD  -PTA Abilify , Celexa , and Valium   DM2 -PTA long acting insulin  20U daily and SSI TID AC prn   Lab Results  Component Value Date   HGBA1C 5.7 (H) 09/04/2023   .CBG (last 3)  Recent Labs    12/17/23 1223 12/17/23 1559 12/17/23 2110  GLUCAP 153* 213* 144*          Condition - Extremely Guarded  Family Communication  :  none  Code Status :  Full  Consults  :  Renal, IR  PUD Prophylaxis :     Procedures  :     CT Liver - 1. Hypoattenuating liver lesions with peripheral enhancement, largest measuring 7.9 cm in the inferior right hepatic lobe concerning for metastases. Differential consideration includes hepatic abscesses. Hepatic protocol MRI with and without IV contrast is recommended. 2. Small right pleural effusion and RLL pneumonia or atelectasis.      Disposition Plan  :    Status is: Inpatient   DVT Prophylaxis  :  Hep gtt   Lab Results  Component Value Date   PLT 133 (L) 12/18/2023    Diet :  Diet Order             Diet NPO time specified Except for: Sips with Meds  Diet effective midnight           Diet Carb Modified Fluid consistency: Thin; Room service appropriate? Yes  Diet effective now                    Inpatient Medications  Scheduled Meds:  acetaminophen   1,000 mg Oral TID   amLODipine   10 mg Oral Daily   ARIPiprazole   2  mg Oral QHS   azithromycin   500 mg Oral Daily   citalopram   20 mg Oral QHS   furosemide   80 mg Intravenous BID   insulin  aspart  0-15 Units Subcutaneous TID WC   insulin  glargine  20 Units Subcutaneous Daily   levothyroxine   100 mcg Oral QAC breakfast   melatonin  10 mg Oral QHS   rosuvastatin   10 mg Oral Daily   Continuous Infusions:  cefTRIAXone  (ROCEPHIN )  IV 1 g (12/17/23 1018)   heparin  1,200 Units/hr (12/18/23 0559)   iron  sucrose 200 mg (12/17/23 1312)   promethazine  (PHENERGAN ) injection (IM or IVPB) 12.5 mg (12/17/23 1722)   PRN Meds:.diazepam , HYDROmorphone  (DILAUDID ) injection, promethazine  (PHENERGAN ) injection (IM or IVPB)    Objective:   Vitals:   12/17/23 0726 12/17/23 1224 12/17/23 1600 12/17/23 2000  BP: (!) 133/37 (!) 132/47 (!) 120/44   Pulse: (!) 50 (!) 50 (!) 50   Resp: 12 12 18    Temp: 97.9 F (36.6 C) 97.6 F (36.4 C) 97.6 F (36.4 C) 97.6 F (36.4 C)  TempSrc: Oral Oral Oral Oral  SpO2: 100% 99% 99%   Weight:      Height:        Wt Readings from Last 3 Encounters:  12/15/23 (!) 138.8 kg  12/05/23 (!) 137.2 kg  11/05/23 136.1 kg    No intake or output data in the 24 hours ending 12/18/23 0716   Physical Exam  Awake Alert, No new F.N deficits, Normal affect .AT,PERRAL Supple Neck,  No JVD,   Symmetrical Chest wall movement, Good air movement bilaterally, CTAB RRR,No Gallops,Rubs or new Murmurs,  +ve B.Sounds, Abd Soft, No tenderness,   2+ edema, both lower extremities under bandage    Data Review:    Recent Labs  Lab 12/15/23 2054 12/18/23 0248  WBC 8.1 6.0  HGB 8.7* 7.7*  HCT 29.8* 25.6*  PLT 154 133*  MCV 104.6* 102.0*  MCH 30.5 30.7  MCHC 29.2* 30.1  RDW 16.6* 16.7*  LYMPHSABS 0.4* 0.5*  MONOABS 0.6 0.6  EOSABS 0.1 0.2  BASOSABS 0.0 0.0    Recent Labs  Lab 12/15/23 2054 12/16/23 0013 12/17/23 0209 12/18/23 0248  NA 136  --  137 135  K 4.6  --  4.9 4.8  CL 98  --  100 100  CO2 24  --  25 23  ANIONGAP 14   --  12 12  GLUCOSE 225*  --  125* 125*  BUN 135*  --  135* 138*  CREATININE 3.65*  --  3.38* 3.46*  AST 23  --   --  17  ALT 16  --   --  14  ALKPHOS 79  --   --  63  BILITOT 0.5  --   --  0.6  ALBUMIN  3.1*  --  2.7* 2.6*  LATICACIDVEN  --  1.0  --   --   INR  --   --  2.1*  --   BNP 583.7*  --   --   --   MG  --   --   --  2.7*  PHOS  --   --  5.5*  --   CALCIUM  8.6*  --  8.4* 8.4*      Recent Labs  Lab 12/15/23 2054 12/16/23 0013 12/17/23 0209 12/18/23 0248  LATICACIDVEN  --  1.0  --   --   INR  --   --  2.1*  --   BNP 583.7*  --   --   --   MG  --   --   --  2.7*  CALCIUM  8.6*  --  8.4* 8.4*    --------------------------------------------------------------------------------------------------------------- Lab Results  Component Value Date   CHOL 105 09/03/2023   HDL 50 09/03/2023   LDLCALC 44 09/03/2023   TRIG 56 09/03/2023   CHOLHDL 2.1 09/03/2023    Lab Results  Component Value Date   HGBA1C 5.7 (H) 09/04/2023   No results for input(s): TSH, T4TOTAL, FREET4, T3FREE, THYROIDAB in the last 72 hours. Recent Labs    12/17/23 0209  FERRITIN 115  TIBC 245*  IRON  22*     Micro Results Recent Results (from the past 240 hours)  Urine Culture     Status: None (Preliminary result)   Collection Time: 12/16/23 12:13 AM   Specimen: Urine, Clean Catch  Result Value Ref Range Status   Specimen Description URINE, CLEAN CATCH  Final   Special Requests NONE  Final   Culture   Final    CULTURE REINCUBATED FOR BETTER GROWTH Performed at [facility], [address] [place], [place] [address]    Report Status PENDING  Incomplete  Blood culture (routine x 2)     Status: None (Preliminary result)   Collection Time: 12/16/23 12:13 AM   Specimen: BLOOD  Result Value Ref Range Status   Specimen Description BLOOD SITE NOT SPECIFIED  Final   Special Requests   Final    BOTTLES DRAWN AEROBIC AND ANAEROBIC Blood  Culture results may not be optimal due to  an inadequate volume of blood received in culture bottles   Culture   Final    NO GROWTH 1 DAY Performed at [facility], [address] [place], [place] [address]    Report Status PENDING  Incomplete  Blood culture (routine x 2)     Status: None (Preliminary result)   Collection Time: 12/16/23 12:13 AM   Specimen: BLOOD  Result Value Ref Range Status   Specimen Description BLOOD SITE NOT SPECIFIED  Final   Special Requests   Final    BOTTLES DRAWN AEROBIC AND ANAEROBIC Blood Culture adequate volume   Culture   Final    NO GROWTH 1 DAY Performed at [facility], [address], [place], [place] [address]    Report Status PENDING  Incomplete    Radiology Report CT LIVER ABDOMEN W WO CONTRAST Result Date: 12/16/2023 EXAM: CT ABDOMEN WITH AND WITHOUT CONTRAST 12/16/2023 08:01:00 PM TECHNIQUE: CT of the abdomen was performed with the administration of intravenous contrast. Multiplanar reformatted images are provided for review. Automated exposure control, iterative reconstruction, and/or weight based adjustment of the mA/kV was utilized to reduce the radiation dose to as low as reasonably achievable. COMPARISON: CT 12/15/2023 CLINICAL HISTORY: Liver lesion, > 1cm. FINDINGS: LOWER CHEST: Small right pleural effusion and pneumonia or atelectasis. HEPATOBILIARY: Redemonstrated hypoattenuating lesions throughout the liver with peripheral enhancement on the portal venous phase in the right base. The largest lesion is in the inferior right hepatic lobe measuring 7.9 cm (series 8 image 92). Differential considerations include metastases or hepatic abscesses. Cholecystectomy. SPLEEN: Spleen demonstrates no acute abnormality. PANCREAS: Pancreas demonstrates no acute abnormality. ADRENAL GLANDS: Adrenal glands demonstrate no acute abnormality. KIDNEYS: Nonobstructing left nephrolithiasis. No hydronephrosis. No perinephric or periureteral stranding. GI AND BOWEL: Stomach and duodenal sweep  demonstrate no acute abnormality. There is no bowel obstruction. No abnormal bowel wall thickening or distension. PERITONEUM AND RETROPERITONEUM: No ascites or free air. Aorta is normal in caliber. IVC filter. LYMPH NODES: No lymphadenopathy. BONES AND SOFT TISSUES: No acute abnormality of the visualized bones. Body wall edema. IMPRESSION: 1. Hypoattenuating liver lesions with peripheral enhancement, largest measuring 7.9 cm in the inferior right hepatic lobe concerning for metastases. Differential consideration includes hepatic abscesses. Hepatic protocol MRI with and without IV contrast is recommended. 2. Small right pleural effusion and RLL pneumonia or atelectasis. Electronically signed by: Norman Gatlin MD 12/16/2023 08:53 PM EDT RP Workstation: [id number]     Signature  -   Lavada Stank M.D on 12/18/2023 at 7:16 AM   -  To page go to [email or web address]

## 2023-12-19 DIAGNOSIS — J189 Pneumonia, unspecified organism: Secondary | ICD-10-CM | POA: Diagnosis not present

## 2023-12-19 LAB — CBC WITH DIFFERENTIAL/PLATELET
Abs Immature Granulocytes: 0.01 K/uL (ref 0.00–0.07)
Basophils Absolute: 0 K/uL (ref 0.0–0.1)
Basophils Relative: 0 %
Eosinophils Absolute: 0.2 K/uL (ref 0.0–0.5)
Eosinophils Relative: 3 %
HCT: 26 % — ABNORMAL LOW (ref 36.0–46.0)
Hemoglobin: 7.6 g/dL — ABNORMAL LOW (ref 12.0–15.0)
Immature Granulocytes: 0 %
Lymphocytes Relative: 8 %
Lymphs Abs: 0.4 K/uL — ABNORMAL LOW (ref 0.7–4.0)
MCH: 30 pg (ref 26.0–34.0)
MCHC: 29.2 g/dL — ABNORMAL LOW (ref 30.0–36.0)
MCV: 102.8 fL — ABNORMAL HIGH (ref 80.0–100.0)
Monocytes Absolute: 0.7 K/uL (ref 0.1–1.0)
Monocytes Relative: 12 %
Neutro Abs: 4.3 K/uL (ref 1.7–7.7)
Neutrophils Relative %: 77 %
Platelets: 146 K/uL — ABNORMAL LOW (ref 150–400)
RBC: 2.53 MIL/uL — ABNORMAL LOW (ref 3.87–5.11)
RDW: 16.9 % — ABNORMAL HIGH (ref 11.5–15.5)
WBC: 5.6 K/uL (ref 4.0–10.5)
nRBC: 0 % (ref 0.0–0.2)

## 2023-12-19 LAB — COMPREHENSIVE METABOLIC PANEL WITH GFR
ALT: 14 U/L (ref 0–44)
AST: 18 U/L (ref 15–41)
Albumin: 2.9 g/dL — ABNORMAL LOW (ref 3.5–5.0)
Alkaline Phosphatase: 60 U/L (ref 38–126)
Anion gap: 11 (ref 5–15)
BUN: 140 mg/dL — ABNORMAL HIGH (ref 8–23)
CO2: 24 mmol/L (ref 22–32)
Calcium: 8.4 mg/dL — ABNORMAL LOW (ref 8.9–10.3)
Chloride: 100 mmol/L (ref 98–111)
Creatinine, Ser: 3.55 mg/dL — ABNORMAL HIGH (ref 0.44–1.00)
GFR, Estimated: 14 mL/min — ABNORMAL LOW (ref >60)
Glucose, Bld: 95 mg/dL (ref 70–99)
Potassium: 4.5 mmol/L (ref 3.5–5.1)
Sodium: 135 mmol/L (ref 135–145)
Total Bilirubin: 0.5 mg/dL (ref 0.0–1.2)
Total Protein: 5.3 g/dL — ABNORMAL LOW (ref 6.5–8.1)

## 2023-12-19 LAB — URINE CULTURE: Culture: 80000 — AB

## 2023-12-19 LAB — MAGNESIUM: Magnesium: 2.8 mg/dL — ABNORMAL HIGH (ref 1.7–2.4)

## 2023-12-19 LAB — PROTIME-INR
INR: 1.6 — ABNORMAL HIGH (ref 0.8–1.2)
Prothrombin Time: 19.7 s — ABNORMAL HIGH (ref 11.4–15.2)

## 2023-12-19 LAB — APTT
aPTT: 123 s — ABNORMAL HIGH (ref 24–36)
aPTT: 112 s — ABNORMAL HIGH (ref 24–36)
aPTT: 73 s — ABNORMAL HIGH (ref 24–36)
aPTT: 49 s — ABNORMAL HIGH (ref 24–36)

## 2023-12-19 LAB — GLUCOSE, CAPILLARY
Glucose-Capillary: 94 mg/dL (ref 70–99)
Glucose-Capillary: 119 mg/dL — ABNORMAL HIGH (ref 70–99)
Glucose-Capillary: 140 mg/dL — ABNORMAL HIGH (ref 70–99)
Glucose-Capillary: 152 mg/dL — ABNORMAL HIGH (ref 70–99)

## 2023-12-19 LAB — HEPARIN LEVEL (UNFRACTIONATED)
Heparin Unfractionated: >1.1 [IU]/mL — ABNORMAL HIGH (ref 0.30–0.70)
Heparin Unfractionated: >1.1 [IU]/mL — ABNORMAL HIGH (ref 0.30–0.70)

## 2023-12-19 MED ORDER — ONDANSETRON HCL 4 MG/2ML IJ SOLN
4.0000 mg | Freq: Four times a day (QID) | INTRAMUSCULAR | Status: DC | PRN
  Administered 2023-12-19 – 2023-12-31 (×19): 4 mg via INTRAVENOUS
  Filled 2023-12-19 (×19): qty 2

## 2023-12-19 MED ORDER — LACTULOSE 10 GM/15ML PO SOLN
30.0000 g | Freq: Two times a day (BID) | ORAL | Status: AC
  Administered 2023-12-19 – 2023-12-20 (×2): 30 g via ORAL
  Filled 2023-12-19 (×2): qty 60

## 2023-12-19 MED ORDER — DOCUSATE SODIUM 100 MG PO CAPS
200.0000 mg | ORAL_CAPSULE | Freq: Two times a day (BID) | ORAL | Status: DC
  Administered 2023-12-20 – 2023-12-21 (×3): 200 mg via ORAL
  Filled 2023-12-19 (×7): qty 2

## 2023-12-19 MED ORDER — SODIUM CHLORIDE 0.9 % IV SOLN
2.0000 g | INTRAVENOUS | Status: AC
  Administered 2023-12-19 – 2023-12-21 (×3): 2 g via INTRAVENOUS
  Filled 2023-12-19 (×3): qty 12.5

## 2023-12-19 MED ORDER — ORAL CARE MOUTH RINSE: 15.0000 mL | OROMUCOSAL | Status: DC | PRN

## 2023-12-19 MED ORDER — POLYETHYLENE GLYCOL 3350 17 G PO PACK
17.0000 g | PACK | Freq: Two times a day (BID) | ORAL | Status: DC
  Administered 2023-12-20 – 2023-12-29 (×3): 17 g via ORAL
  Filled 2023-12-19 (×14): qty 1

## 2023-12-19 MED ORDER — AZITHROMYCIN 500 MG PO TABS
500.0000 mg | ORAL_TABLET | Freq: Every day | ORAL | Status: AC
  Administered 2023-12-19 – 2023-12-20 (×2): 500 mg via ORAL
  Filled 2023-12-19 (×2): qty 1

## 2023-12-19 MED ORDER — SODIUM CHLORIDE 0.9 % IV SOLN
12.5000 mg | Freq: Four times a day (QID) | INTRAVENOUS | Status: DC | PRN
  Administered 2023-12-19 – 2023-12-29 (×5): 12.5 mg via INTRAVENOUS
  Filled 2023-12-19 (×5): qty 12.5

## 2023-12-19 MED ORDER — METOLAZONE 5 MG PO TABS
10.0000 mg | ORAL_TABLET | Freq: Once | ORAL | Status: AC
Start: 2023-12-19 — End: 2023-12-19
  Administered 2023-12-19: 10 mg via ORAL
  Filled 2023-12-19: qty 2

## 2023-12-19 NOTE — Progress Notes (Signed)
 PROGRESS NOTE                                                                                                                                                                                                             Patient Demographics:    [name], is a 68 y.o. female, DOB - [birth year], FMW:[id number]  Outpatient Primary MD for the patient is Valma Carwin, MD    LOS - 2  Admit date - 12/15/2023    Chief Complaint  Patient presents with   Chest Pain       Brief Narrative (HPI from H&P)     68 y.o. female long-term resident at SNF with medical history significant of pulmonary hypertension, chronic respiratory failure requiring 2L [place] at baseline, Afib on Eliquis , HFpEF (EF 60-65% in 08/2023), DM2 (A1c 5.7 in 08/2023), DVT/PE x3 s/p IVC filter in 09/2009, CKD stage IV, and morbid obesity p/w SOB/cp iso volume overload 2/2 LLL CAP and HFpEF exacerbation c/b worsening renal failure and found to have liver lesions c/f metastases.    Subjective:   Patient in bed, appears comfortable, denies any headache, no fever, no chest pain or pressure, no shortness of breath , no abdominal pain. No new focal weakness.   Assessment  & Plan :   Respiratory insufficiency iso volume overload 2/2 LLL CAP and HFpEF exacerbation c/b worsening renal failure   Multifactorial SOB/cp but likely exacerbated by volume overload iso worsening renal failure  has significant volume overload, placed on IV diuretics per nephrology, shortness of breath and chest discomfort improved, continue to monitor with ongoing diuresis.   AKI on CKD (chronic kidney disease) stage 4, GFR 15-29 ml/min baseline creatinine around 2.6 -  Per nephrology   Pneumonia on admission.  Empiric antibiotics.  Speech eval.  Supportive care with oxygen and nebulizer treatments.  Follow cultures.  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  Follow-up  with PCP for weight loss.  BMI of 57  HFpEF EF 60% on recent echocardiogram recent [facility] noted no significant CAD, PA pressure 62/16, wedge of 11, PVR 4.4, group 3 pulmonary hypertension suspected, Diuresis per nephrology.   Liver lesions c/f metastases  - IR consulted patient was on Eliquis  biopsy hencebiopsy is scheduled for  12/21/23 .  Will check CA 19.9 and CEA levels as well.  Paroxysmal Afib  -anticoagulation currently heparin  drip post biopsy switch back to Eliquis    Hypothyroid  -PTA Synthroid    HTN  -PTA amlodipine    Chronic lymphedema - Venous stasis ulcer of LLE  -Continue wrapping BLE and Unna boots to BLE as well   MDD  -PTA Abilify , Celexa , and Valium   DM2 -PTA long acting insulin  20U daily and SSI TID AC prn   Lab Results  Component Value Date   HGBA1C 5.7 (H) 09/04/2023   .CBG (last 3)  Recent Labs    12/18/23 1146 12/18/23 1540 12/18/23 1930  GLUCAP 174* 152* 134*          Condition - Extremely Guarded  Family Communication  :  none  Code Status :  Full  Consults  :  Renal, IR  PUD Prophylaxis :     Procedures  :     CT Liver - 1. Hypoattenuating liver lesions with peripheral enhancement, largest measuring 7.9 cm in the inferior right hepatic lobe concerning for metastases. Differential consideration includes hepatic abscesses. Hepatic protocol MRI with and without IV contrast is recommended. 2. Small right pleural effusion and RLL pneumonia or atelectasis.      Disposition Plan  :    Status is: Inpatient   DVT Prophylaxis  :  Hep gtt   Lab Results  Component Value Date   PLT 146 (L) 12/19/2023    Diet :  Diet Order             Diet NPO time specified Except for: Sips with Meds  Diet effective midnight           Diet Carb Modified Fluid consistency: Thin; Room service appropriate? Yes  Diet effective now                    Inpatient Medications  Scheduled Meds:  acetaminophen   1,000 mg Oral TID   amLODipine   10 mg  Oral Daily   ARIPiprazole   2 mg Oral QHS   Chlorhexidine  Gluconate Cloth  6 each Topical Daily   citalopram   20 mg Oral QHS   furosemide   80 mg Intravenous BID   insulin  aspart  0-15 Units Subcutaneous TID WC   insulin  glargine  20 Units Subcutaneous Daily   levothyroxine   100 mcg Oral QAC breakfast   melatonin  10 mg Oral QHS   rosuvastatin   10 mg Oral Daily   Continuous Infusions:  cefTRIAXone  (ROCEPHIN )  IV Stopped (12/18/23 1007)   heparin  1,000 Units/hr (12/19/23 0513)   promethazine  (PHENERGAN ) injection (IM or IVPB) 12.5 mg (12/19/23 0616)   PRN Meds:.diazepam , HYDROmorphone  (DILAUDID ) injection, promethazine  (PHENERGAN ) injection (IM or IVPB)    Objective:   Vitals:   12/18/23 1929 12/18/23 2358 12/19/23 0322 12/19/23 0406  BP: (!) 128/45 (!) 129/48  117/72  Pulse:   (!) 50   Resp:   19   Temp: 97.9 F (36.6 C) 98.9 F (37.2 C)  98.3 F (36.8 C)  TempSrc: Oral Oral  Oral  SpO2:   100%   Weight:      Height:        Wt Readings from Last 3 Encounters:  12/15/23 (!) 138.8 kg  12/05/23 (!) 137.2 kg  11/05/23 136.1 kg     Intake/Output Summary (Last 24 hours) at 12/19/2023 0801 Last data filed at 12/19/2023 0406 Gross per 24 hour  Intake 549.57 ml  Output 2275 ml  Net -1725.43 ml     Physical Exam  Awake Alert,  No new F.N deficits, Normal affect [place].AT,PERRAL Supple Neck, No JVD,   Symmetrical Chest wall movement, Good air movement bilaterally, CTAB RRR,No Gallops,Rubs or new Murmurs,  +ve B.Sounds, Abd Soft, No tenderness,   2+ edema, both lower extremities under bandage    Data Review:    Recent Labs  Lab 12/15/23 2054 12/18/23 0248 12/19/23 0227  WBC 8.1 6.0 5.6  HGB 8.7* 7.7* 7.6*  HCT 29.8* 25.6* 26.0*  PLT 154 133* 146*  MCV 104.6* 102.0* 102.8*  MCH 30.5 30.7 30.0  MCHC 29.2* 30.1 29.2*  RDW 16.6* 16.7* 16.9*  LYMPHSABS 0.4* 0.5* 0.4*  MONOABS 0.6 0.6 0.7  EOSABS 0.1 0.2 0.2  BASOSABS 0.0 0.0 0.0    Recent Labs  Lab  12/15/23 2054 12/16/23 0013 12/17/23 0209 12/18/23 0248 12/19/23 0227  NA 136  --  137 135 135  K 4.6  --  4.9 4.8 4.5  CL 98  --  100 100 100  CO2 24  --  25 23 24   ANIONGAP 14  --  12 12 11   GLUCOSE 225*  --  125* 125* 95  BUN 135*  --  135* 138* 140*  CREATININE 3.65*  --  3.38* 3.46* 3.55*  AST 23  --   --  17 18  ALT 16  --   --  14 14  ALKPHOS 79  --   --  63 60  BILITOT 0.5  --   --  0.6 0.5  ALBUMIN  3.1*  --  2.7* 2.6* 2.9*  LATICACIDVEN  --  1.0  --   --   --   INR  --   --  2.1*  --   --   BNP 583.7*  --   --   --   --   MG  --   --   --  2.7* 2.8*  PHOS  --   --  5.5*  --   --   CALCIUM  8.6*  --  8.4* 8.4* 8.4*      Recent Labs  Lab 12/15/23 2054 12/16/23 0013 12/17/23 0209 12/18/23 0248 12/19/23 0227  LATICACIDVEN  --  1.0  --   --   --   INR  --   --  2.1*  --   --   BNP 583.7*  --   --   --   --   MG  --   --   --  2.7* 2.8*  CALCIUM  8.6*  --  8.4* 8.4* 8.4*    --------------------------------------------------------------------------------------------------------------- Lab Results  Component Value Date   CHOL 105 09/03/2023   HDL 50 09/03/2023   LDLCALC 44 09/03/2023   TRIG 56 09/03/2023   CHOLHDL 2.1 09/03/2023    Lab Results  Component Value Date   HGBA1C 5.7 (H) 09/04/2023   No results for input(s): TSH, T4TOTAL, FREET4, T3FREE, THYROIDAB in the last 72 hours. Recent Labs    12/17/23 0209  FERRITIN 115  TIBC 245*  IRON  22*     Micro Results Recent Results (from the past 240 hours)  Urine Culture     Status: Abnormal (Preliminary result)   Collection Time: 12/16/23 12:13 AM   Specimen: Urine, Clean Catch  Result Value Ref Range Status   Specimen Description URINE, CLEAN CATCH  Final   Special Requests NONE  Final   Culture (A)  Final    80,000 COLONIES/mL CITROBACTER FREUNDII SUSCEPTIBILITIES TO FOLLOW Performed at [facility], [address] [place],  [place] [address]    Report Status PENDING   Incomplete  Blood culture (routine x 2)     Status: None (Preliminary result)   Collection Time: 12/16/23 12:13 AM   Specimen: BLOOD  Result Value Ref Range Status   Specimen Description BLOOD SITE NOT SPECIFIED  Final   Special Requests   Final    BOTTLES DRAWN AEROBIC AND ANAEROBIC Blood Culture results may not be optimal due to an inadequate volume of blood received in culture bottles   Culture   Final    NO GROWTH 2 DAYS Performed at [facility], [address], [place], [place] [address]    Report Status PENDING  Incomplete  Blood culture (routine x 2)     Status: None (Preliminary result)   Collection Time: 12/16/23 12:13 AM   Specimen: BLOOD  Result Value Ref Range Status   Specimen Description BLOOD SITE NOT SPECIFIED  Final   Special Requests   Final    BOTTLES DRAWN AEROBIC AND ANAEROBIC Blood Culture adequate volume   Culture   Final    NO GROWTH 2 DAYS Performed at [facility], [address] [place], [place] [address]    Report Status PENDING  Incomplete    Radiology Report No results found.    Signature  -   Lavada Stank M.D on 12/19/2023 at 8:01 AM   -  To page go to [email or web address]

## 2023-12-19 NOTE — Progress Notes (Signed)
 PHARMACY - ANTICOAGULATION CONSULT NOTE  Pharmacy Consult for heparin  Indication: atrial fibrillation  Labs: Recent Labs    12/17/23 0209 12/17/23 1924 12/17/23 1924 12/18/23 0248 12/19/23 0227 12/19/23 0431  HGB  --   --   --  7.7* 7.6*  --   HCT  --   --   --  25.6* 26.0*  --   PLT  --   --   --  133* 146*  --   APTT  --  75*   < > 83* 123* 112*  LABPROT 24.6*  --   --   --   --   --   INR 2.1*  --   --   --   --   --   HEPARINUNFRC  --  >1.10*  --  >1.10* >1.10*  --   CREATININE 3.38*  --   --  3.46* 3.55*  --    < > = values in this interval not displayed.   Assessment: 68yo female now supratherapeutic on heparin  after two PTTs at goal; no infusion issues or signs of bleeding per RN.  Goal of Therapy:  aPTT 66-102 seconds   Plan:  Decrease heparin  infusion by 2 units/kgABW/hr to 1000 units/hr. Check PTT in 8 hours.   Marvetta Dauphin, PharmD, BCPS 12/19/2023 5:13 AM

## 2023-12-19 NOTE — Plan of Care (Signed)
  Problem: Education: Goal: Ability to describe self-care measures that may prevent or decrease complications (Diabetes Survival Skills Education) will improve Outcome: Progressing   Problem: Coping: Goal: Ability to adjust to condition or change in health will improve Outcome: Progressing   Problem: Fluid Volume: Goal: Ability to maintain a balanced intake and output will improve Outcome: Progressing   Problem: Skin Integrity: Goal: Risk for impaired skin integrity will decrease Outcome: Progressing   Problem: Education: Goal: Knowledge of General Education information will improve Description: Including pain rating scale, medication(s)/side effects and non-pharmacologic comfort measures Outcome: Progressing   Problem: Pain Managment: Goal: General experience of comfort will improve and/or be controlled Outcome: Progressing   Problem: Safety: Goal: Ability to remain free from injury will improve Outcome: Progressing

## 2023-12-19 NOTE — H&P (Incomplete)
 History and Physical    XOIE KREUSER FMW:[id number] DOB: [birth year] DOA: 12/15/2023  PCP: Valma Carwin, MD  Patient coming from: home  I have personally briefly reviewed patient's old medical records in [facility]  Chief Complaint:   HPI: [name] is a 68 y.o. female with medical history significant of    ED Course: ***  Review of Systems: As per HPI otherwise 10 point review of systems negative.   Past Medical History:  Diagnosis Date   A-fib (HCC)    Anemia    Anticoagulated on Coumadin , chronically 09/03/2011   Anxiety    Arthritis    right hip; both knees; left wrist/shoulder; back (01/19/2013   Bleeding on Coumadin  08/2012; 01/18/2013   [facility] admissions (01/19/2013)   CHF (congestive heart failure) (HCC)    2-3 times (01/19/2013)   Chronic lower back pain    Depression    DVT (deep venous thrombosis) (HCC) 10 years ago   numerous/notes 01/18/2013   GERD (gastroesophageal reflux disease)    Gout    Headache(784.0)    maybe weekly (01/19/2013)   Heart murmur    High cholesterol    been off RX for this at one time (01/18/2013)   History of blood transfusion 1983; 04/2012   3 w/ childbirth; hospitalized for pain (01/19/2013)   Hypertension    Hypothyroidism    Migraines    twice/yr maybe (01/19/2013)   Obstructive sleep apnea 05/03/2012   OSA (obstructive sleep apnea)    sent me for test in 04/2012; never ordered mask, etc (01/19/2013)   PE (pulmonary thromboembolism) (HCC) 3 years ag0   3/notes 01/18/2013   Pneumonia before 2011   once' (01/18/2013)   Renal disorder    kindey function low; Metformin was destroying my kidneys (01/19/2013)   Shortness of breath    only related to my CHF (01/18/2013)   Swelling of hand 08/31/2014   RT HAND   Type II diabetes mellitus (HCC)    UTI (urinary tract infection) 08/31/2014    Past Surgical History:  Procedure Laterality Date   CARDIAC CATHETERIZATION N/A 03/13/2015   Procedure:  Right/Left Heart Cath and Coronary Angiography;  Surgeon: Gordy Bergamo, MD;  Location: [facility] INVASIVE CV LAB;  Service: Cardiovascular;  Laterality: N/A;   CATARACT EXTRACTION W/ INTRAOCULAR LENS  IMPLANT, BILATERAL Bilateral 2006-2011   CESAREAN SECTION  1983   CHOLECYSTECTOMY  ~ 2002   COLONOSCOPY N/A 01/21/2013   Procedure: COLONOSCOPY;  Surgeon: Belvie JONETTA Just, MD;  Location: [facility] ENDOSCOPY;  Service: Endoscopy;  Laterality: N/A;   EYE SURGERY Bilateral    multiple (01/18/2013)   INCISION AND DRAINAGE OF WOUND Right 12/01/2023   Procedure: IRRIGATION AND DEBRIDEMENT WOUND;  Surgeon: Desiderio Schanz, MD;  Location: [facility] ORS;  Service: General;  Laterality: Right;   PACEMAKER IMPLANT N/A 08/31/2018   Symptomatic bradycardia due to mobitz II second degree AV block, permanent afib/ atypical atrial flutter implanted by Dr Kelsie PRESCOTT PLANA REPAIR OF RETINAL DEATACHMENT Right    PARS PLANA VITRECTOMY Bilateral 2004-2006   several (01/18/2013)   REFRACTIVE SURGERY Bilateral    for stigmatism (01/18/2013)   REFRACTIVE SURGERY Left ~ 11/2012   to puff it up cause vision got hazy (01/18/2013)   RIGHT HEART CATH N/A 12/30/2018   Procedure: RIGHT HEART CATH;  Surgeon: Cherrie Toribio SAUNDERS, MD;  Location: [facility] INVASIVE CV LAB;  Service: Cardiovascular;  Laterality: N/A;   RIGHT/LEFT HEART CATH AND CORONARY ANGIOGRAPHY N/A 09/07/2023  Procedure: RIGHT/LEFT HEART CATH AND CORONARY ANGIOGRAPHY;  Surgeon: Wendel Lurena POUR, MD;  Location: [facility] INVASIVE CV LAB;  Service: Cardiovascular;  Laterality: N/A;   VENA CAVA FILTER PLACEMENT  2011?     reports that she quit smoking about 35 years ago. Her smoking use included cigarettes. She started smoking about 65 years ago. She has a 1.5 pack-year smoking history. She has never used smokeless tobacco. She reports that she does not currently use alcohol. She reports that she does not use drugs.  Allergies  Allergen Reactions   Ms Contin [Morphine] Hives, Rash and  Other (See Comments)    Broke out in brown spots all over.     Family History  Problem Relation Age of Onset   Cerebral aneurysm Mother    Hypertension Father    Cerebral aneurysm Maternal Grandfather    Cerebral aneurysm Maternal Aunt    Cancer Maternal Uncle    *** Prior to Admission medications   Medication Sig Start Date End Date Taking? Authorizing Provider  allopurinol  (ZYLOPRIM ) 100 MG tablet Take 50 mg by mouth every other day. 10/28/23  Yes [provider]  amLODipine  (NORVASC ) 5 MG tablet Take 2 tablets (10 mg total) by mouth daily. 10/26/23  Yes Mil[place], Harlene HERO, FNP  apixaban  (ELIQUIS ) 5 MG TABS tablet Take 5 mg by mouth 2 (two) times daily.   Yes [provider]  ARIPiprazole  (ABILIFY ) 2 MG tablet Take 1 tablet (2 mg total) by mouth at bedtime. 12/06/23  Yes Jens Durand, MD  citalopram  (CELEXA ) 40 MG tablet Take 40 mg by mouth at bedtime. 10/28/23  Yes [provider]  diazepam  (VALIUM ) 5 MG tablet Take 1 tablet (5 mg total) by mouth 2 (two) times daily as needed for anxiety or muscle spasms. 12/06/23  Yes Jens Durand, MD  insulin  glargine (LANTUS  SOLOSTAR) 100 UNIT/ML Solostar Pen Inject 20 Units into the skin daily.   Yes [provider]  insulin  lispro (HUMALOG) 100 UNIT/ML injection Inject 5 Units into the skin 3 (three) times daily before meals.   Yes [provider]  iron  polysaccharides (NIFEREX) 150 MG capsule Take 1 capsule (150 mg total) by mouth daily. 12/07/23  Yes Jens Durand, MD  levothyroxine  (SYNTHROID ) 100 MCG tablet Take 1 tablet (100 mcg total) by mouth daily before breakfast. 12/07/23  Yes Jens Durand, MD  Melatonin 10 MG TABS Take 10 mg by mouth.   Yes [provider]  Multiple Vitamins-Minerals (CENTRUM SILVER) CHEW Chew 2 each by mouth daily.   Yes [provider]  rosuvastatin  (CRESTOR ) 10 MG tablet Take 1 tablet (10 mg total) by mouth daily. 05/09/16  Yes Epifanio Rolland Robin, MD   sennosides-docusate sodium  (SENOKOT-S) 8.6-50 MG tablet Take 2 tablets by mouth daily.   Yes [provider]  insulin  aspart (NOVOLOG ) 100 UNIT/ML injection Inject 5 Units into the skin 3 (three) times daily with meals. Patient not taking: Reported on 12/17/2023 12/06/23   Jens Durand, MD    Physical Exam: Vitals:   12/18/23 1140 12/18/23 1541 12/18/23 1929 12/18/23 2358  BP: (!) 138/45 (!) 121/51 (!) 128/45 (!) 129/48  Pulse: (!) 50 (!) 50    Resp: 16 19    Temp: 98.7 F (37.1 C) 98.2 F (36.8 C) 97.9 F (36.6 C) 98.9 F (37.2 C)  TempSrc: Oral Oral Oral Oral  SpO2: 99% 97%    Weight:      Height:        Constitutional: NAD, calm, comfortable  Vitals:   12/18/23 1140 12/18/23 1541 12/18/23 1929 12/18/23 2358  BP: (!) 138/45 (!) 121/51 (!) 128/45 (!) 129/48  Pulse: (!) 50 (!) 50    Resp: 16 19    Temp: 98.7 F (37.1 C) 98.2 F (36.8 C) 97.9 F (36.6 C) 98.9 F (37.2 C)  TempSrc: Oral Oral Oral Oral  SpO2: 99% 97%    Weight:      Height:       Eyes: PERRL, lids and conjunctivae normal ENMT: Mucous membranes are moist. Posterior pharynx clear of any exudate or lesions.Normal dentition.  Neck: normal, supple, no masses, no thyromegaly Respiratory: clear to auscultation bilaterally, no wheezing, no crackles. Normal respiratory effort. No accessory muscle use.  Cardiovascular: Regular rate and rhythm, no murmurs / rubs / gallops. No extremity edema. 2+ pedal pulses. No carotid bruits.  Abdomen: no tenderness, no masses palpated. No hepatosplenomegaly. Bowel sounds positive.  Musculoskeletal: no clubbing / cyanosis. No joint deformity upper and lower extremities. Good ROM, no contractures. Normal muscle tone.  Skin: no rashes, lesions, ulcers. No induration Neurologic: CN 2-12 grossly intact. Sensation intact, DTR normal. Strength 5/5 in all 4.  Psychiatric: Normal judgment and insight. Alert and oriented x 3. Normal mood.    Labs on Admission: I have personally  reviewed following labs and imaging studies  CBC: Recent Labs  Lab 12/15/23 2054 12/18/23 0248  WBC 8.1 6.0  NEUTROABS 7.0 4.7  HGB 8.7* 7.7*  HCT 29.8* 25.6*  MCV 104.6* 102.0*  PLT 154 133*   Basic Metabolic Panel: Recent Labs  Lab 12/15/23 2054 12/17/23 0209 12/18/23 0248  NA 136 137 135  K 4.6 4.9 4.8  CL 98 100 100  CO2 24 25 23   GLUCOSE 225* 125* 125*  BUN 135* 135* 138*  CREATININE 3.65* 3.38* 3.46*  CALCIUM  8.6* 8.4* 8.4*  MG  --   --  2.7*  PHOS  --  5.5*  --    GFR: Estimated Creatinine Clearance: 21 mL/min (A) (by C-G formula based on SCr of 3.46 mg/dL (H)). Liver Function Tests: Recent Labs  Lab 12/15/23 2054 12/17/23 0209 12/18/23 0248  AST 23  --  17  ALT 16  --  14  ALKPHOS 79  --  63  BILITOT 0.5  --  0.6  PROT 5.7*  --  5.1*  ALBUMIN  3.1* 2.7* 2.6*   No results for input(s): LIPASE, AMYLASE in the last 168 hours. No results for input(s): AMMONIA in the last 168 hours. Coagulation Profile: Recent Labs  Lab 12/17/23 0209  INR 2.1*   Cardiac Enzymes: No results for input(s): CKTOTAL, CKMB, CKMBINDEX, TROPONINI in the last 168 hours. BNP (last 3 results) No results for input(s): PROBNP in the last 8760 hours. HbA1C: No results for input(s): HGBA1C in the last 72 hours. CBG: Recent Labs  Lab 12/18/23 0730 12/18/23 0854 12/18/23 1146 12/18/23 1540 12/18/23 1930  GLUCAP 128* 119* 174* 152* 134*   Lipid Profile: No results for input(s): CHOL, HDL, LDLCALC, TRIG, CHOLHDL, LDLDIRECT in the last 72 hours. Thyroid  Function Tests: No results for input(s): TSH, T4TOTAL, FREET4, T3FREE, THYROIDAB in the last 72 hours. Anemia Panel: Recent Labs    12/17/23 0209  FERRITIN 115  TIBC 245*  IRON  22*   Urine analysis:    Component Value Date/Time   COLORURINE AMBER (A) 12/16/2023 0013   APPEARANCEUR CLOUDY (A) 12/16/2023 0013   LABSPEC 1.013 12/16/2023 0013   PHURINE 5.0 12/16/2023 0013    GLUCOSEU NEGATIVE 12/16/2023  0013   HGBUR MODERATE (A) 12/16/2023 0013   BILIRUBINUR NEGATIVE 12/16/2023 0013   KETONESUR NEGATIVE 12/16/2023 0013   PROTEINUR 30 (A) 12/16/2023 0013   UROBILINOGEN 0.2 08/31/2014 1158   NITRITE NEGATIVE 12/16/2023 0013   LEUKOCYTESUR LARGE (A) 12/16/2023 0013    Radiological Exams on Admission: No results found.  EKG: Independently reviewed. ***  Assessment/Plan Principal Problem:   CAP (community acquired pneumonia)   ***  DVT prophylaxis: *** (Lovenox /Heparin /SCD's/anticoagulated/None (if comfort care) Code Status: *** (Full/Partial (specify details) Family Communication: *** (Specify name, relationship. Do not write discussed with patient. Specify tel # if discussed over the phone) Disposition Plan: *** (specify when and where you expect patient to be discharged) Consults called: *** (with names) Admission status: *** (inpatient / obs / tele / medical floor / SDU)   Camila DELENA Ned MD [facility] Pager [phone]  If 7PM-7AM, please contact night-coverage [email or web address] Password [email or web address]  12/19/2023, 12:17 AM

## 2023-12-19 NOTE — Progress Notes (Incomplete)
 PHARMACY - ANTICOAGULATION CONSULT NOTE  Pharmacy Consult for Heparin  drip Indication: atrial fibrillation  Allergies  Allergen Reactions   Ms Contin [Morphine] Hives, Rash and Other (See Comments)    Broke out in brown spots all over.     Patient Measurements: Height: 5' 1 (154.9 cm) Weight: (!) 138.8 kg (306 lb) IBW/kg (Calculated) : 47.8 HEPARIN  DW (KG): 83.5  Vital Signs: Temp: 98.9 F (37.2 C) (09/12 2358) Temp Source: Oral (09/12 2358) BP: 129/48 (09/12 2358)  Labs: Recent Labs    12/17/23 0209 12/17/23 1924 12/18/23 0248 12/19/23 0227  HGB  --   --  7.7* 7.6*  HCT  --   --  25.6* 26.0*  PLT  --   --  133* 146*  APTT  --  75* 83* 123*  LABPROT 24.6*  --   --   --   INR 2.1*  --   --   --   HEPARINUNFRC  --  >1.10* >1.10* >1.10*  CREATININE 3.38*  --  3.46*  --     Estimated Creatinine Clearance: 21 mL/min (A) (by C-G formula based on SCr of 3.46 mg/dL (H)).   Assessment: 68 yo female with h/o PAF on apixaban  PTA admitted with AKI. MD wishes to convert to heparin  drip at this time. Last dose of apixaban  9/10 at ~1600.   aPTT this am is supra-therapeutic at 123 (heparin  level > 1.1 give recent DOAC use). No issues with bleeding or infusion per RN  Goal of Therapy:  Heparin  level 0.3-0.7 units/ml aPTT 66-102 seconds Monitor platelets by anticoagulation protocol: Yes   Plan:  Decrease heparin  drip at 1000 units/hr Continue aPTTs until heparin  levels correlate Daily CBC, Heparin  level and APTT (for now)   Thank you for allowing pharmacy to be a part of this patient's care.   Lynwood Poplar, PharmD, BCPS Clinical Pharmacist 12/19/2023 3:43 AM

## 2023-12-19 NOTE — Progress Notes (Signed)
 [facility] NEPHROLOGY PROGRESS NOTE  Assessment/ Plan: Pt is a 68 y.o. 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   # Acute kidney injury on CKD stage IV likely due to reduced effective renal perfusion caused by CHF/infection/recent prolonged hospitalization versus progressive CKD.   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 Dennise at [facility], last seen in 06/2023.  Since then patient had hospitalization at [facility] related with abscess, CHF, AKI on CKD.  Now presents with fluid overload in the setting of withholding diuretics. - Foley catheter was placed with urine output around 2.3 L.  Continue IV Lasix  and metolazone .  - Continue with strict ins and outs and daily lab.  No need for dialysis at this time however patient agreed with RRT when needed.  If no improvement in renal function by Monday then she will likely need HD.   # Chronic hypoxic respiratory failure due to pneumonia, CHF: Currently on antibiotics and diuretics.   # CHF exacerbation with preserved EF: Recent echo reviewed with EF 60 to 65%.  Cath with nonobstructive CAD.   # Liver lesions consistent with metastasis: IR consulted for biopsy/drainage likely early next week.   # Anemia: Iron  saturation 9% with low serum iron , she has no bacteremia.  I will order IV iron  and hold ESA while undergoing evaluation for liver mass.  Discussed with the primary team.  Subjective: Seen and examined at bedside.  Urine output is around 2.3 L.  Denies nausea, vomiting, chest pain or shortness of breath. Objective Vital signs in last 24 hours: Vitals:   12/18/23 2358 12/19/23  0322 12/19/23 0406 12/19/23 0827  BP: (!) 129/48  117/72 (!) 137/46  Pulse:  (!) 50  (!) 50  Resp:  19  20  Temp: 98.9 F (37.2 C)  98.3 F (36.8 C) 98.3 F (36.8 C)  TempSrc: Oral  Oral Oral  SpO2:  100%  99%  Weight:      Height:       Weight change:   Intake/Output Summary (Last 24 hours) at 12/19/2023 1204 Last data filed at 12/19/2023 0406 Gross per 24 hour  Intake 549.57 ml  Output 625 ml  Net -75.43 ml       Labs: RENAL PANEL Recent Labs  Lab 12/15/23 2054 12/17/23 0209 12/18/23 0248 12/19/23 0227  NA 136 137 135 135  K 4.6 4.9 4.8 4.5  CL 98 100 100 100  CO2 24 25 23 24   GLUCOSE 225* 125* 125* 95  BUN 135* 135* 138* 140*  CREATININE 3.65* 3.38* 3.46* 3.55*  CALCIUM  8.6* 8.4* 8.4* 8.4*  MG  --   --  2.7* 2.8*  PHOS  --  5.5*  --   --   ALBUMIN  3.1* 2.7* 2.6* 2.9*    Liver Function Tests: Recent Labs  Lab 12/15/23 2054 12/17/23 0209 12/18/23 0248 12/19/23 0227  AST 23  --  17 18  ALT 16  --  14 14  ALKPHOS 79  --  63 60  BILITOT 0.5  --  0.6 0.5  PROT 5.7*  --  5.1* 5.3*  ALBUMIN  3.1* 2.7* 2.6* 2.9*  No results for input(s): LIPASE, AMYLASE in the last 168 hours. No results for input(s): AMMONIA in the last 168 hours. CBC: Recent Labs    11/17/23 1113 11/18/23 1050 11/29/23 0857 12/02/23 0545 12/15/23 2054 12/17/23 0209 12/18/23 0248 12/19/23 0227  HGB  --    < > 8.0* 8.2* 8.7*  --  7.7* 7.6*  MCV  --    < > 103.1* 100.7* 104.6*  --  102.0* 102.8*  VITAMINB12 480  --   --   --   --   --   --   --   FERRITIN  --   --   --   --   --  115  --   --   TIBC 245*  --   --   --   --  245*  --   --   IRON  33  --   --   --   --  22*  --   --    < > = values in this interval not displayed.    Cardiac Enzymes: No results for input(s): CKTOTAL, CKMB, CKMBINDEX, TROPONINI in the last 168 hours. CBG: Recent Labs  Lab 12/18/23 0854 12/18/23 1146 12/18/23 1540 12/18/23 1930 12/19/23 0824  GLUCAP 119* 174* 152* 134* 94     Iron  Studies:  Recent Labs    12/17/23 0209  IRON  22*  TIBC 245*  FERRITIN 115   Studies/Results: No results found.   Medications: Infusions:  cefTRIAXone  (ROCEPHIN )  IV 1 g (12/19/23 0912)   heparin  1,000 Units/hr (12/19/23 0513)   promethazine  (PHENERGAN ) injection (IM or IVPB)      Scheduled Medications:  acetaminophen   1,000 mg Oral TID   amLODipine   10 mg Oral Daily   ARIPiprazole   2 mg Oral QHS   Chlorhexidine  Gluconate Cloth  6 each Topical Daily   citalopram   20 mg Oral QHS   furosemide   80 mg Intravenous BID   insulin  aspart  0-15 Units Subcutaneous TID WC   insulin  glargine  20 Units Subcutaneous Daily   levothyroxine   100 mcg Oral QAC breakfast   melatonin  10 mg Oral QHS   rosuvastatin   10 mg Oral Daily    have reviewed scheduled and prn medications.  Physical Exam: General:NAD, comfortable Heart:RRR, s1s2 nl Lungs: Reduced bilateral breath sound Abdomen:soft, Non-tender, non-distended Extremities: Bilateral lower extremities edema, chronic venous stasis changes Neurology: Alert, awake and following command  [name] [name] 12/19/2023,12:04 PM  LOS: 2 days

## 2023-12-19 NOTE — Progress Notes (Signed)
 PHARMACY - ANTICOAGULATION CONSULT NOTE  Pharmacy Consult for Heparin  drip Indication: atrial fibrillation  Allergies  Allergen Reactions   Ms Contin [Morphine] Hives, Rash and Other (See Comments)    Broke out in brown spots all over.     Patient Measurements: Height: 5' 1 (154.9 cm) Weight: (!) 138.8 kg (306 lb) IBW/kg (Calculated) : 47.8 HEPARIN  DW (KG): 83.5  Vital Signs: Temp: 98.1 F (36.7 C) (09/13 2106) Temp Source: Oral (09/13 2106) BP: 152/51 (09/13 2106) Pulse Rate: 50 (09/13 2106)  Labs: Recent Labs    12/17/23 0209 12/17/23 1924 12/18/23 0248 12/19/23 0227 12/19/23 0431 12/19/23 0952 12/19/23 1226 12/19/23 2055  HGB  --   --  7.7* 7.6*  --   --   --   --   HCT  --   --  25.6* 26.0*  --   --   --   --   PLT  --   --  133* 146*  --   --   --   --   APTT  --    < > 83* 123* 112*  --  73* 49*  LABPROT 24.6*  --   --   --   --  19.7*  --   --   INR 2.1*  --   --   --   --  1.6*  --   --   HEPARINUNFRC  --    < > >1.10* >1.10*  --   --   --  >1.10*  CREATININE 3.38*  --  3.46* 3.55*  --   --   --   --    < > = values in this interval not displayed.    Estimated Creatinine Clearance: 20.4 mL/min (A) (by C-G formula based on SCr of 3.55 mg/dL (H)).   Medical History: Past Medical History:  Diagnosis Date   A-fib (HCC)    Anemia    Anticoagulated on Coumadin , chronically 09/03/2011   Anxiety    Arthritis    right hip; both knees; left wrist/shoulder; back (01/19/2013   Bleeding on Coumadin  08/2012; 01/18/2013   [facility] admissions (01/19/2013)   CHF (congestive heart failure) (HCC)    2-3 times (01/19/2013)   Chronic lower back pain    Depression    DVT (deep venous thrombosis) (HCC) 10 years ago   numerous/notes 01/18/2013   GERD (gastroesophageal reflux disease)    Gout    Headache(784.0)    maybe weekly (01/19/2013)   Heart murmur    High cholesterol    been off RX for this at one time (01/18/2013)   History of blood transfusion  1983; 04/2012   3 w/ childbirth; hospitalized for pain (01/19/2013)   Hypertension    Hypothyroidism    Migraines    twice/yr maybe (01/19/2013)   Obstructive sleep apnea 05/03/2012   OSA (obstructive sleep apnea)    sent me for test in 04/2012; never ordered mask, etc (01/19/2013)   PE (pulmonary thromboembolism) (HCC) 3 years ag0   3/notes 01/18/2013   Pneumonia before 2011   once' (01/18/2013)   Renal disorder    kindey function low; Metformin was destroying my kidneys (01/19/2013)   Shortness of breath    only related to my CHF (01/18/2013)   Swelling of hand 08/31/2014   RT HAND   Type II diabetes mellitus (HCC)    UTI (urinary tract infection) 08/31/2014    Medications:  Medications Prior to Admission  Medication Sig Dispense Refill Last Dose/Taking  allopurinol  (ZYLOPRIM ) 100 MG tablet Take 50 mg by mouth every other day.   12/15/2023   amLODipine  (NORVASC ) 5 MG tablet Take 2 tablets (10 mg total) by mouth daily.   12/15/2023   apixaban  (ELIQUIS ) 5 MG TABS tablet Take 5 mg by mouth 2 (two) times daily.   12/15/2023   ARIPiprazole  (ABILIFY ) 2 MG tablet Take 1 tablet (2 mg total) by mouth at bedtime.   12/15/2023   citalopram  (CELEXA ) 40 MG tablet Take 40 mg by mouth at bedtime.   12/15/2023   diazepam  (VALIUM ) 5 MG tablet Take 1 tablet (5 mg total) by mouth 2 (two) times daily as needed for anxiety or muscle spasms. 10 tablet 0 12/15/2023   insulin  glargine (LANTUS  SOLOSTAR) 100 UNIT/ML Solostar Pen Inject 20 Units into the skin daily.   12/15/2023   insulin  lispro (HUMALOG) 100 UNIT/ML injection Inject 5 Units into the skin 3 (three) times daily before meals.   12/15/2023   iron  polysaccharides (NIFEREX) 150 MG capsule Take 1 capsule (150 mg total) by mouth daily.   12/15/2023   levothyroxine  (SYNTHROID ) 100 MCG tablet Take 1 tablet (100 mcg total) by mouth daily before breakfast.   12/15/2023   Melatonin 10 MG TABS Take 10 mg by mouth.   12/15/2023   Multiple Vitamins-Minerals (CENTRUM  SILVER) CHEW Chew 2 each by mouth daily.   12/15/2023   rosuvastatin  (CRESTOR ) 10 MG tablet Take 1 tablet (10 mg total) by mouth daily. 30 tablet 0 12/15/2023   sennosides-docusate sodium  (SENOKOT-S) 8.6-50 MG tablet Take 2 tablets by mouth daily.   12/15/2023   insulin  aspart (NOVOLOG ) 100 UNIT/ML injection Inject 5 Units into the skin 3 (three) times daily with meals. (Patient not taking: Reported on 12/17/2023)   Not Taking    Assessment: 68 yo female with h/o PAF on apixaban  PTA admitted with AKI. MD wishes to convert to heparin  drip at this time. Last dose of apixaban  9/10 at ~1600.   9/13 1300 Update: -aPTT therapeutic at 73 secs on heparin  1000 units/hr -Hgb, PLT stable -Per RN, no signs of bleeding or pauses in infusion  PM f/u - aPTT down to 49.  No known issues with IV infusion, no overt bleeding or complications noted.  Goal of Therapy:  Heparin  level 0.3-0.7 units/ml aPTT 66-102 seconds Monitor platelets by anticoagulation protocol: Yes   Plan:  -Increase IV Heparin  to 1100 units/hr. -Monitor 8 hour confirmatory aPTT and heparin  level -Monitor daily CBC, Heparin  level and APTT (for now) -F/u switch back to apixaban   Harlene Barlow, Berdine BIRCH, BCPS, BCCP Clinical Pharmacist  12/19/2023 10:12 PM   [facility] pharmacy phone numbers are listed on [email or web address]

## 2023-12-19 NOTE — Plan of Care (Signed)

## 2023-12-19 NOTE — Progress Notes (Signed)
 PHARMACY - ANTICOAGULATION CONSULT NOTE  Pharmacy Consult for Heparin  drip Indication: atrial fibrillation  Allergies  Allergen Reactions   Ms Contin [Morphine] Hives, Rash and Other (See Comments)    Broke out in brown spots all over.     Patient Measurements: Height: 5' 1 (154.9 cm) Weight: (!) 138.8 kg (306 lb) IBW/kg (Calculated) : 47.8 HEPARIN  DW (KG): 83.5  Vital Signs: Temp: 97.9 F (36.6 C) (09/13 1255) Temp Source: Oral (09/13 1255) BP: 144/51 (09/13 1255) Pulse Rate: 50 (09/13 1255)  Labs: Recent Labs    12/17/23 0209 12/17/23 1924 12/17/23 1924 12/18/23 0248 12/19/23 0227 12/19/23 0431 12/19/23 0952 12/19/23 1226  HGB  --   --   --  7.7* 7.6*  --   --   --   HCT  --   --   --  25.6* 26.0*  --   --   --   PLT  --   --   --  133* 146*  --   --   --   APTT  --  75*   < > 83* 123* 112*  --  73*  LABPROT 24.6*  --   --   --   --   --  19.7*  --   INR 2.1*  --   --   --   --   --  1.6*  --   HEPARINUNFRC  --  >1.10*  --  >1.10* >1.10*  --   --   --   CREATININE 3.38*  --   --  3.46* 3.55*  --   --   --    < > = values in this interval not displayed.    Estimated Creatinine Clearance: 20.4 mL/min (A) (by C-G formula based on SCr of 3.55 mg/dL (H)).   Medical History: Past Medical History:  Diagnosis Date   A-fib (HCC)    Anemia    Anticoagulated on Coumadin , chronically 09/03/2011   Anxiety    Arthritis    right hip; both knees; left wrist/shoulder; back (01/19/2013   Bleeding on Coumadin  08/2012; 01/18/2013   [facility] admissions (01/19/2013)   CHF (congestive heart failure) (HCC)    2-3 times (01/19/2013)   Chronic lower back pain    Depression    DVT (deep venous thrombosis) (HCC) 10 years ago   numerous/notes 01/18/2013   GERD (gastroesophageal reflux disease)    Gout    Headache(784.0)    maybe weekly (01/19/2013)   Heart murmur    High cholesterol    been off RX for this at one time (01/18/2013)   History of blood transfusion  1983; 04/2012   3 w/ childbirth; hospitalized for pain (01/19/2013)   Hypertension    Hypothyroidism    Migraines    twice/yr maybe (01/19/2013)   Obstructive sleep apnea 05/03/2012   OSA (obstructive sleep apnea)    sent me for test in 04/2012; never ordered mask, etc (01/19/2013)   PE (pulmonary thromboembolism) (HCC) 3 years ag0   3/notes 01/18/2013   Pneumonia before 2011   once' (01/18/2013)   Renal disorder    kindey function low; Metformin was destroying my kidneys (01/19/2013)   Shortness of breath    only related to my CHF (01/18/2013)   Swelling of hand 08/31/2014   RT HAND   Type II diabetes mellitus (HCC)    UTI (urinary tract infection) 08/31/2014    Medications:  Medications Prior to Admission  Medication Sig Dispense Refill Last Dose/Taking  allopurinol  (ZYLOPRIM ) 100 MG tablet Take 50 mg by mouth every other day.   12/15/2023   amLODipine  (NORVASC ) 5 MG tablet Take 2 tablets (10 mg total) by mouth daily.   12/15/2023   apixaban  (ELIQUIS ) 5 MG TABS tablet Take 5 mg by mouth 2 (two) times daily.   12/15/2023   ARIPiprazole  (ABILIFY ) 2 MG tablet Take 1 tablet (2 mg total) by mouth at bedtime.   12/15/2023   citalopram  (CELEXA ) 40 MG tablet Take 40 mg by mouth at bedtime.   12/15/2023   diazepam  (VALIUM ) 5 MG tablet Take 1 tablet (5 mg total) by mouth 2 (two) times daily as needed for anxiety or muscle spasms. 10 tablet 0 12/15/2023   insulin  glargine (LANTUS  SOLOSTAR) 100 UNIT/ML Solostar Pen Inject 20 Units into the skin daily.   12/15/2023   insulin  lispro (HUMALOG) 100 UNIT/ML injection Inject 5 Units into the skin 3 (three) times daily before meals.   12/15/2023   iron  polysaccharides (NIFEREX) 150 MG capsule Take 1 capsule (150 mg total) by mouth daily.   12/15/2023   levothyroxine  (SYNTHROID ) 100 MCG tablet Take 1 tablet (100 mcg total) by mouth daily before breakfast.   12/15/2023   Melatonin 10 MG TABS Take 10 mg by mouth.   12/15/2023   Multiple Vitamins-Minerals (CENTRUM  SILVER) CHEW Chew 2 each by mouth daily.   12/15/2023   rosuvastatin  (CRESTOR ) 10 MG tablet Take 1 tablet (10 mg total) by mouth daily. 30 tablet 0 12/15/2023   sennosides-docusate sodium  (SENOKOT-S) 8.6-50 MG tablet Take 2 tablets by mouth daily.   12/15/2023   insulin  aspart (NOVOLOG ) 100 UNIT/ML injection Inject 5 Units into the skin 3 (three) times daily with meals. (Patient not taking: Reported on 12/17/2023)   Not Taking    Assessment: 68 yo female with h/o PAF on apixaban  PTA admitted with AKI. MD wishes to convert to heparin  drip at this time. Last dose of apixaban  9/10 at ~1600.   9/13 1300 Update: -aPTT therapeutic at 73 secs on heparin  1000 units/hr -Hgb, PLT stable -Per RN, no signs of bleeding or pauses in infusion  Goal of Therapy:  Heparin  level 0.3-0.7 units/ml aPTT 66-102 seconds Monitor platelets by anticoagulation protocol: Yes   Plan:  -Continue Heparin  drip at 1000 units/hr -Monitor 8 hour confirmatory aPTT and heparin  level -Monitor daily CBC, Heparin  level and APTT (for now) -F/u switch back to apixaban   Izetta Carl, PharmD PGY1 Pharmacy Resident [facility]  12/19/2023 1:15 PM

## 2023-12-20 ENCOUNTER — Encounter (HOSPITAL_COMMUNITY): Payer: Self-pay | Admitting: Internal Medicine

## 2023-12-20 ENCOUNTER — Inpatient Hospital Stay (HOSPITAL_COMMUNITY)

## 2023-12-20 DIAGNOSIS — J189 Pneumonia, unspecified organism: Secondary | ICD-10-CM | POA: Diagnosis not present

## 2023-12-20 LAB — CBC WITH DIFFERENTIAL/PLATELET
Abs Immature Granulocytes: 0.03 K/uL (ref 0.00–0.07)
Basophils Absolute: 0 K/uL (ref 0.0–0.1)
Basophils Relative: 0 %
Eosinophils Absolute: 0.1 K/uL (ref 0.0–0.5)
Eosinophils Relative: 1 %
HCT: 27.7 % — ABNORMAL LOW (ref 36.0–46.0)
Hemoglobin: 8.3 g/dL — ABNORMAL LOW (ref 12.0–15.0)
Immature Granulocytes: 0 %
Lymphocytes Relative: 7 %
Lymphs Abs: 0.5 K/uL — ABNORMAL LOW (ref 0.7–4.0)
MCH: 30.5 pg (ref 26.0–34.0)
MCHC: 30 g/dL (ref 30.0–36.0)
MCV: 101.8 fL — ABNORMAL HIGH (ref 80.0–100.0)
Monocytes Absolute: 0.8 K/uL (ref 0.1–1.0)
Monocytes Relative: 11 %
Neutro Abs: 5.5 K/uL (ref 1.7–7.7)
Neutrophils Relative %: 81 %
Platelets: 163 K/uL (ref 150–400)
RBC: 2.72 MIL/uL — ABNORMAL LOW (ref 3.87–5.11)
RDW: 16.9 % — ABNORMAL HIGH (ref 11.5–15.5)
WBC: 6.8 K/uL (ref 4.0–10.5)
nRBC: 0 % (ref 0.0–0.2)

## 2023-12-20 LAB — COMPREHENSIVE METABOLIC PANEL WITH GFR
ALT: 17 U/L (ref 0–44)
AST: 26 U/L (ref 15–41)
Albumin: 3.1 g/dL — ABNORMAL LOW (ref 3.5–5.0)
Alkaline Phosphatase: 69 U/L (ref 38–126)
Anion gap: 13 (ref 5–15)
BUN: 143 mg/dL — ABNORMAL HIGH (ref 8–23)
CO2: 22 mmol/L (ref 22–32)
Calcium: 8.6 mg/dL — ABNORMAL LOW (ref 8.9–10.3)
Chloride: 100 mmol/L (ref 98–111)
Creatinine, Ser: 3.77 mg/dL — ABNORMAL HIGH (ref 0.44–1.00)
GFR, Estimated: 13 mL/min — ABNORMAL LOW (ref >60)
Glucose, Bld: 118 mg/dL — ABNORMAL HIGH (ref 70–99)
Potassium: 4.9 mmol/L (ref 3.5–5.1)
Sodium: 135 mmol/L (ref 135–145)
Total Bilirubin: 0.6 mg/dL (ref 0.0–1.2)
Total Protein: 4.7 g/dL — ABNORMAL LOW (ref 6.5–8.1)

## 2023-12-20 LAB — APTT
aPTT: 70 s — ABNORMAL HIGH (ref 24–36)
aPTT: 73 s — ABNORMAL HIGH (ref 24–36)

## 2023-12-20 LAB — HEPATITIS B SURFACE ANTIGEN: Hepatitis B Surface Ag: NONREACTIVE

## 2023-12-20 LAB — GLUCOSE, CAPILLARY
Glucose-Capillary: 117 mg/dL — ABNORMAL HIGH (ref 70–99)
Glucose-Capillary: 145 mg/dL — ABNORMAL HIGH (ref 70–99)
Glucose-Capillary: 182 mg/dL — ABNORMAL HIGH (ref 70–99)
Glucose-Capillary: 155 mg/dL — ABNORMAL HIGH (ref 70–99)

## 2023-12-20 LAB — HEPARIN LEVEL (UNFRACTIONATED): Heparin Unfractionated: >1.1 [IU]/mL — ABNORMAL HIGH (ref 0.30–0.70)

## 2023-12-20 LAB — MAGNESIUM: Magnesium: 2.8 mg/dL — ABNORMAL HIGH (ref 1.7–2.4)

## 2023-12-20 MED ORDER — CHLORHEXIDINE GLUCONATE CLOTH 2 % EX PADS
6.0000 | MEDICATED_PAD | Freq: Every day | CUTANEOUS | Status: DC
  Administered 2023-12-20 – 2023-12-23 (×4): 6 via TOPICAL

## 2023-12-20 NOTE — Progress Notes (Signed)
 PHARMACY - ANTICOAGULATION CONSULT NOTE  Pharmacy Consult for Heparin  drip Indication: atrial fibrillation  Allergies  Allergen Reactions   Ms Contin [Morphine] Hives, Rash and Other (See Comments)    Broke out in brown spots all over.     Patient Measurements: Height: 5' 1 (154.9 cm) Weight: (!) 138.8 kg (306 lb) IBW/kg (Calculated) : 47.8 HEPARIN  DW (KG): 83.5  Vital Signs: Temp: 98.1 F (36.7 C) (09/14 0451) Temp Source: Oral (09/14 0451) BP: 141/48 (09/14 0451) Pulse Rate: 50 (09/14 0451)  Labs: Recent Labs    12/18/23 0248 12/19/23 0227 12/19/23 0431 12/19/23 0952 12/19/23 1226 12/19/23 2055  HGB 7.7* 7.6*  --   --   --   --   HCT 25.6* 26.0*  --   --   --   --   PLT 133* 146*  --   --   --   --   APTT 83* 123* 112*  --  73* 49*  LABPROT  --   --   --  19.7*  --   --   INR  --   --   --  1.6*  --   --   HEPARINUNFRC >1.10* >1.10*  --   --   --  >1.10*  CREATININE 3.46* 3.55*  --   --   --   --     Estimated Creatinine Clearance: 20.4 mL/min (A) (by C-G formula based on SCr of 3.55 mg/dL (H)).   Medical History: Past Medical History:  Diagnosis Date   A-fib (HCC)    Anemia    Anticoagulated on Coumadin , chronically 09/03/2011   Anxiety    Arthritis    right hip; both knees; left wrist/shoulder; back (01/19/2013   Bleeding on Coumadin  08/2012; 01/18/2013   [facility] admissions (01/19/2013)   CHF (congestive heart failure) (HCC)    2-3 times (01/19/2013)   Chronic lower back pain    Depression    DVT (deep venous thrombosis) (HCC) 10 years ago   numerous/notes 01/18/2013   GERD (gastroesophageal reflux disease)    Gout    Headache(784.0)    maybe weekly (01/19/2013)   Heart murmur    High cholesterol    been off RX for this at one time (01/18/2013)   History of blood transfusion 1983; 04/2012   3 w/ childbirth; hospitalized for pain (01/19/2013)   Hypertension    Hypothyroidism    Migraines    twice/yr maybe (01/19/2013)    Obstructive sleep apnea 05/03/2012   OSA (obstructive sleep apnea)    sent me for test in 04/2012; never ordered mask, etc (01/19/2013)   PE (pulmonary thromboembolism) (HCC) 3 years ag0   3/notes 01/18/2013   Pneumonia before 2011   once' (01/18/2013)   Renal disorder    kindey function low; Metformin was destroying my kidneys (01/19/2013)   Shortness of breath    only related to my CHF (01/18/2013)   Swelling of hand 08/31/2014   RT HAND   Type II diabetes mellitus (HCC)    UTI (urinary tract infection) 08/31/2014    Medications:  Medications Prior to Admission  Medication Sig Dispense Refill Last Dose/Taking   allopurinol  (ZYLOPRIM ) 100 MG tablet Take 50 mg by mouth every other day.   12/15/2023   amLODipine  (NORVASC ) 5 MG tablet Take 2 tablets (10 mg total) by mouth daily.   12/15/2023   apixaban  (ELIQUIS ) 5 MG TABS tablet Take 5 mg by mouth 2 (two) times daily.   12/15/2023   ARIPiprazole  (  ABILIFY ) 2 MG tablet Take 1 tablet (2 mg total) by mouth at bedtime.   12/15/2023   citalopram  (CELEXA ) 40 MG tablet Take 40 mg by mouth at bedtime.   12/15/2023   diazepam  (VALIUM ) 5 MG tablet Take 1 tablet (5 mg total) by mouth 2 (two) times daily as needed for anxiety or muscle spasms. 10 tablet 0 12/15/2023   insulin  glargine (LANTUS  SOLOSTAR) 100 UNIT/ML Solostar Pen Inject 20 Units into the skin daily.   12/15/2023   insulin  lispro (HUMALOG) 100 UNIT/ML injection Inject 5 Units into the skin 3 (three) times daily before meals.   12/15/2023   iron  polysaccharides (NIFEREX) 150 MG capsule Take 1 capsule (150 mg total) by mouth daily.   12/15/2023   levothyroxine  (SYNTHROID ) 100 MCG tablet Take 1 tablet (100 mcg total) by mouth daily before breakfast.   12/15/2023   Melatonin 10 MG TABS Take 10 mg by mouth.   12/15/2023   Multiple Vitamins-Minerals (CENTRUM SILVER) CHEW Chew 2 each by mouth daily.   12/15/2023   rosuvastatin  (CRESTOR ) 10 MG tablet Take 1 tablet (10 mg total) by mouth daily. 30 tablet 0 12/15/2023    sennosides-docusate sodium  (SENOKOT-S) 8.6-50 MG tablet Take 2 tablets by mouth daily.   12/15/2023   insulin  aspart (NOVOLOG ) 100 UNIT/ML injection Inject 5 Units into the skin 3 (three) times daily with meals. (Patient not taking: Reported on 12/17/2023)   Not Taking    Assessment: 68 yo female with h/o PAF on apixaban  PTA admitted with AKI. MD wishes to convert to heparin  drip at this time. Last dose of apixaban  9/10 at ~1600.   9/14 AM update: -aPTT therapeutic at 70 secs on heparin  1100 units/hr -Heparin  level supratherapeutic at >1.1 -- will move to monitoring this daily as still not correlated with aPTT -Hgb and PLT WNL -Per RN, no signs of bleeding or pauses in infusion  Goal of Therapy:  Heparin  level 0.3-0.7 units/ml aPTT 66-102 seconds Monitor platelets by anticoagulation protocol: Yes   Plan:  -Continue IV Heparin  1100 units/hr. -Monitor 8 hour confirmatory aPTT -Monitor daily heparin  level -Monitor Hgb, PLT and signs of bleeding -F/u switch back to apixaban  after liver biopsy tomorrow, 9/15  Izetta Carl, PharmD PGY1 Pharmacy Resident [facility]  12/20/2023 7:21 AM

## 2023-12-20 NOTE — Plan of Care (Signed)

## 2023-12-20 NOTE — Plan of Care (Signed)
 Problem: Education: Goal: Ability to describe self-care measures that may prevent or decrease complications (Diabetes Survival Skills Education) will improve 12/20/2023 0012 by Gladis Delon PARAS, RN Outcome: Progressing 12/20/2023 0012 by Gladis Delon PARAS, RN Outcome: Progressing Goal: Individualized Educational Video(s) 12/20/2023 0012 by Gladis Delon PARAS, RN Outcome: Progressing 12/20/2023 0012 by Gladis Delon PARAS, RN Outcome: Progressing   Problem: Coping: Goal: Ability to adjust to condition or change in health will improve 12/20/2023 0012 by Gladis Delon PARAS, RN Outcome: Progressing 12/20/2023 0012 by Gladis Delon PARAS, RN Outcome: Progressing   Problem: Fluid Volume: Goal: Ability to maintain a balanced intake and output will improve 12/20/2023 0012 by Gladis Delon PARAS, RN Outcome: Progressing 12/20/2023 0012 by Gladis Delon PARAS, RN Outcome: Progressing   Problem: Health Behavior/Discharge Planning: Goal: Ability to identify and utilize available resources and services will improve 12/20/2023 0012 by Gladis Delon PARAS, RN Outcome: Progressing 12/20/2023 0012 by Gladis Delon PARAS, RN Outcome: Progressing Goal: Ability to manage health-related needs will improve 12/20/2023 0012 by Gladis Delon PARAS, RN Outcome: Progressing 12/20/2023 0012 by Gladis Delon PARAS, RN Outcome: Progressing   Problem: Metabolic: Goal: Ability to maintain appropriate glucose levels will improve 12/20/2023 0012 by Gladis Delon PARAS, RN Outcome: Progressing 12/20/2023 0012 by Gladis Delon PARAS, RN Outcome: Progressing   Problem: Nutritional: Goal: Maintenance of adequate nutrition will improve 12/20/2023 0012 by Gladis Delon PARAS, RN Outcome: Progressing 12/20/2023 0012 by Gladis Delon PARAS, RN Outcome: Progressing Goal: Progress toward achieving an optimal weight will improve 12/20/2023 0012 by Gladis Delon PARAS, RN Outcome: Progressing 12/20/2023 0012 by Gladis Delon PARAS,  RN Outcome: Progressing   Problem: Skin Integrity: Goal: Risk for impaired skin integrity will decrease 12/20/2023 0012 by Gladis Delon PARAS, RN Outcome: Progressing 12/20/2023 0012 by Gladis Delon PARAS, RN Outcome: Progressing   Problem: Tissue Perfusion: Goal: Adequacy of tissue perfusion will improve 12/20/2023 0012 by Gladis Delon PARAS, RN Outcome: Progressing 12/20/2023 0012 by Gladis Delon PARAS, RN Outcome: Progressing   Problem: Education: Goal: Knowledge of General Education information will improve Description: Including pain rating scale, medication(s)/side effects and non-pharmacologic comfort measures 12/20/2023 0012 by Gladis Delon PARAS, RN Outcome: Progressing 12/20/2023 0012 by Gladis Delon PARAS, RN Outcome: Progressing   Problem: Health Behavior/Discharge Planning: Goal: Ability to manage health-related needs will improve 12/20/2023 0012 by Gladis Delon PARAS, RN Outcome: Progressing 12/20/2023 0012 by Gladis Delon PARAS, RN Outcome: Progressing   Problem: Clinical Measurements: Goal: Ability to maintain clinical measurements within normal limits will improve 12/20/2023 0012 by Gladis Delon PARAS, RN Outcome: Progressing 12/20/2023 0012 by Gladis Delon PARAS, RN Outcome: Progressing Goal: Will remain free from infection 12/20/2023 0012 by Gladis Delon PARAS, RN Outcome: Progressing 12/20/2023 0012 by Gladis Delon PARAS, RN Outcome: Progressing Goal: Diagnostic test results will improve 12/20/2023 0012 by Gladis Delon PARAS, RN Outcome: Progressing 12/20/2023 0012 by Gladis Delon PARAS, RN Outcome: Progressing Goal: Respiratory complications will improve 12/20/2023 0012 by Gladis Delon PARAS, RN Outcome: Progressing 12/20/2023 0012 by Gladis Delon PARAS, RN Outcome: Progressing Goal: Cardiovascular complication will be avoided 12/20/2023 0012 by Gladis Delon PARAS, RN Outcome: Progressing 12/20/2023 0012 by Gladis Delon PARAS, RN Outcome: Progressing    Problem: Activity: Goal: Risk for activity intolerance will decrease 12/20/2023 0012 by Gladis Delon PARAS, RN Outcome: Progressing 12/20/2023 0012 by Gladis Delon PARAS, RN Outcome: Progressing   Problem: Nutrition: Goal: Adequate nutrition will be maintained 12/20/2023 0012 by Gladis Delon PARAS, RN Outcome: Progressing 12/20/2023 0012 by Gladis Delon PARAS, RN Outcome: Progressing  Problem: Coping: Goal: Level of anxiety will decrease 12/20/2023 0012 by Gladis Delon PARAS, RN Outcome: Progressing 12/20/2023 0012 by Gladis Delon PARAS, RN Outcome: Progressing   Problem: Elimination: Goal: Will not experience complications related to bowel motility 12/20/2023 0012 by Gladis Delon PARAS, RN Outcome: Progressing 12/20/2023 0012 by Gladis Delon PARAS, RN Outcome: Progressing Goal: Will not experience complications related to urinary retention 12/20/2023 0012 by Gladis Delon PARAS, RN Outcome: Progressing 12/20/2023 0012 by Gladis Delon PARAS, RN Outcome: Progressing   Problem: Pain Managment: Goal: General experience of comfort will improve and/or be controlled 12/20/2023 0012 by Gladis Delon PARAS, RN Outcome: Progressing 12/20/2023 0012 by Gladis Delon PARAS, RN Outcome: Progressing   Problem: Safety: Goal: Ability to remain free from injury will improve 12/20/2023 0012 by Gladis Delon PARAS, RN Outcome: Progressing 12/20/2023 0012 by Gladis Delon PARAS, RN Outcome: Progressing   Problem: Skin Integrity: Goal: Risk for impaired skin integrity will decrease 12/20/2023 0012 by Gladis Delon PARAS, RN Outcome: Progressing 12/20/2023 0012 by Gladis Delon PARAS, RN Outcome: Progressing

## 2023-12-20 NOTE — Progress Notes (Signed)
 [name] PROGRESS NOTE  Assessment/ Plan: Pt is a 68 y.o. 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   # Acute kidney injury on CKD stage IV likely due to reduced effective renal perfusion caused by CHF/infection/recent prolonged hospitalization versus progressive CKD.   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 Dennise at [facility], last seen in 06/2023.  Since then patient had hospitalization at [facility] related with abscess, CHF, AKI on CKD.  Now presents with fluid overload in the setting of withholding diuretics. -Urine output is marginal with IV diuretics and continue to remain volume overload.  Unfortunately showing signs of uremia including nausea and generalized weakness.   - Plan for dialysis tomorrow, keep n.p.o. postmidnight and consult IR for [facility].  Patient agreed with the plan.   # Chronic hypoxic respiratory failure due to pneumonia, CHF: Currently on antibiotics and diuretics.   # CHF exacerbation with preserved EF: Recent echo reviewed with EF 60 to 65%.  Cath with nonobstructive CAD.   # Liver lesions consistent with metastasis: IR consulted for biopsy/drainage likely tomorrow.  # Anemia: Iron  saturation 9% with low serum iron , she has no bacteremia.  I will order IV iron  and hold ESA while undergoing evaluation for liver mass.  Discussed with the primary team.  Subjective: Seen and examined at bedside.  Urine output is only 650 cc.  She does have Foley catheter.  Reports worsening nausea, some shortness of breath and not feeling well.  She wants to receive dialysis if that helps.  Objective Vital  signs in last 24 hours: Vitals:   12/19/23 2106 12/20/23 0044 12/20/23 0451 12/20/23 0722  BP: (!) 152/51 (!) 138/49 (!) 141/48 (!) 141/48  Pulse: (!) 50 (!) 50 (!) 50 (!) 50  Resp:  20 20 (!) 24  Temp: 98.1 F (36.7 C) 98.6 F (37 C) 98.1 F (36.7 C) 97.7 F (36.5 C)  TempSrc: Oral Oral Oral Oral  SpO2:  98% 99% 99%  Weight:      Height:       Weight change:   Intake/Output Summary (Last 24 hours) at 12/20/2023 1100 Last data filed at 12/20/2023 1015 Gross per 24 hour  Intake 696.03 ml  Output 750 ml  Net -53.97 ml       Labs: RENAL PANEL Recent Labs  Lab 12/15/23 2054 12/17/23 0209 12/18/23 0248 12/19/23 0227 12/20/23 0704  NA 136 137 135 135 135  K 4.6 4.9 4.8 4.5 4.9  CL 98 100 100 100 100  CO2 24 25 23 24 22   GLUCOSE 225* 125* 125* 95 118*  BUN 135* 135* 138* 140* 143*  CREATININE 3.65* 3.38* 3.46* 3.55* 3.77*  CALCIUM  8.6* 8.4* 8.4* 8.4* 8.6*  MG  --   --  2.7* 2.8* 2.8*  PHOS  --  5.5*  --   --   --   ALBUMIN  3.1* 2.7* 2.6* 2.9* 3.1*    Liver Function Tests: Recent Labs  Lab 12/18/23 0248 12/19/23 0227 12/20/23 0704  AST 17 18 26   ALT 14 14 17   ALKPHOS 63 60 69  BILITOT 0.6 0.5 0.6  PROT 5.1* 5.3* 4.7*  ALBUMIN  2.6* 2.9* 3.1*   No results for input(s): LIPASE, AMYLASE in the last 168 hours. No results for input(s): AMMONIA in the last 168 hours. CBC: Recent Labs    11/17/23 1113 11/18/23 1050 12/02/23 0545 12/15/23 2054 12/17/23 0209 12/18/23 0248 12/19/23 0227 12/20/23 0704  HGB  --    < > 8.2* 8.7*  --  7.7* 7.6* 8.3*  MCV  --    < > 100.7* 104.6*  --  102.0* 102.8* 101.8*  VITAMINB12 480  --   --   --   --   --   --   --   FERRITIN  --   --   --   --  115  --   --   --   TIBC 245*  --   --   --  245*  --   --   --   IRON  33  --   --   --  22*  --   --   --    < > = values in this interval not displayed.    Cardiac Enzymes: No results for input(s): CKTOTAL, CKMB, CKMBINDEX, TROPONINI in the last 168  hours. CBG: Recent Labs  Lab 12/19/23 0824 12/19/23 1252 12/19/23 1608 12/19/23 2120 12/20/23 0725  GLUCAP 94 119* 140* 152* 117*    Iron  Studies:  No results for input(s): IRON , TIBC, TRANSFERRIN, FERRITIN in the last 72 hours.  Studies/Results: DG Abd 1 View Result Date: 12/20/2023 CLINICAL DATA:  68 year old female with constipation. EXAM: ABDOMEN - 1 VIEW COMPARISON:  CT Abdomen and Pelvis 12/16/2023 and earlier. FINDINGS: AP supine views at 0759 hours. Four views. Non obstructed bowel gas pattern. IVC filter redemonstrated. Stable cholecystectomy clips. Moderate volume retained rectosigmoid colon stool. Mild large bowel retained stool elsewhere. Stable visualized osseous structures. Advanced lower lumbar disc and endplate degeneration. Grossly negative lung bases, cardiac pacemaker lead redemonstrated. IMPRESSION: Non obstructed bowel gas pattern with moderate volume retained stool, primarily in the rectosigmoid. Electronically Signed   By: VEAR Hurst M.D.   On: 12/20/2023 08:19     Medications: Infusions:  ceFEPime  (MAXIPIME ) IV 2 g (12/19/23 1419)   heparin  1,100 Units/hr (12/20/23 0409)   promethazine  (PHENERGAN ) injection (IM or IVPB) 150 mL/hr at 12/20/23 0727    Scheduled Medications:  acetaminophen   1,000 mg Oral TID   amLODipine   10 mg Oral Daily   ARIPiprazole   2 mg Oral QHS   Chlorhexidine  Gluconate Cloth  6 each Topical Daily   citalopram   20 mg Oral QHS   docusate sodium   200 mg Oral BID   furosemide   80 mg Intravenous BID   insulin  aspart  0-15 Units Subcutaneous TID WC   insulin  glargine  20 Units Subcutaneous Daily   levothyroxine   100 mcg Oral QAC breakfast   melatonin  10 mg Oral QHS   polyethylene glycol  17 g Oral BID   rosuvastatin   10 mg Oral Daily    have reviewed scheduled and prn medications.  Physical Exam: General:NAD, comfortable Heart:RRR, s1s2 nl Lungs: Reduced bilateral breath sound Abdomen:soft, Non-tender,  non-distended Extremities: Bilateral lower extremities edema, chronic venous stasis changes Neurology: Alert, awake and following command  [name] [name] 12/20/2023,11:00 AM  LOS: 3 days

## 2023-12-20 NOTE — Progress Notes (Signed)
 PHARMACY - ANTICOAGULATION CONSULT NOTE  Pharmacy Consult for Heparin  drip Indication: atrial fibrillation  Allergies  Allergen Reactions   Ms Contin [Morphine] Hives, Rash and Other (See Comments)    Broke out in brown spots all over.     Patient Measurements: Height: 5' 1 (154.9 cm) Weight: (!) 138.8 kg (306 lb) IBW/kg (Calculated) : 47.8 HEPARIN  DW (KG): 83.5  Vital Signs: Temp: 97.9 F (36.6 C) (09/14 1533) Temp Source: Oral (09/14 1533) BP: 142/51 (09/14 1533) Pulse Rate: 50 (09/14 1533)  Labs: Recent Labs    12/18/23 0248 12/19/23 0227 12/19/23 0431 12/19/23 0952 12/19/23 1226 12/19/23 2055 12/20/23 0704 12/20/23 1502  HGB 7.7* 7.6*  --   --   --   --  8.3*  --   HCT 25.6* 26.0*  --   --   --   --  27.7*  --   PLT 133* 146*  --   --   --   --  163  --   APTT 83* 123*   < >  --    < > 49* 70* 73*  LABPROT  --   --   --  19.7*  --   --   --   --   INR  --   --   --  1.6*  --   --   --   --   HEPARINUNFRC >1.10* >1.10*  --   --   --  >1.10* >1.10*  --   CREATININE 3.46* 3.55*  --   --   --   --  3.77*  --    < > = values in this interval not displayed.    Estimated Creatinine Clearance: 19.2 mL/min (A) (by C-G formula based on SCr of 3.77 mg/dL (H)).   Medical History: Past Medical History:  Diagnosis Date   A-fib (HCC)    Anemia    Anticoagulated on Coumadin , chronically 09/03/2011   Anxiety    Arthritis    right hip; both knees; left wrist/shoulder; back (01/19/2013   Bleeding on Coumadin  08/2012; 01/18/2013   [facility] admissions (01/19/2013)   CHF (congestive heart failure) (HCC)    2-3 times (01/19/2013)   Chronic lower back pain    Depression    DVT (deep venous thrombosis) (HCC) 10 years ago   numerous/notes 01/18/2013   GERD (gastroesophageal reflux disease)    Gout    Headache(784.0)    maybe weekly (01/19/2013)   Heart murmur    High cholesterol    been off RX for this at one time (01/18/2013)   History of blood transfusion  1983; 04/2012   3 w/ childbirth; hospitalized for pain (01/19/2013)   Hypertension    Hypothyroidism    Migraines    twice/yr maybe (01/19/2013)   Obstructive sleep apnea 05/03/2012   OSA (obstructive sleep apnea)    sent me for test in 04/2012; never ordered mask, etc (01/19/2013)   PE (pulmonary thromboembolism) (HCC) 3 years ag0   3/notes 01/18/2013   Pneumonia before 2011   once' (01/18/2013)   Renal disorder    kindey function low; Metformin was destroying my kidneys (01/19/2013)   Shortness of breath    only related to my CHF (01/18/2013)   Swelling of hand 08/31/2014   RT HAND   Type II diabetes mellitus (HCC)    UTI (urinary tract infection) 08/31/2014    Medications:  Medications Prior to Admission  Medication Sig Dispense Refill Last Dose/Taking   allopurinol  (ZYLOPRIM ) 100  MG tablet Take 50 mg by mouth every other day.   12/15/2023   amLODipine  (NORVASC ) 5 MG tablet Take 2 tablets (10 mg total) by mouth daily.   12/15/2023   apixaban  (ELIQUIS ) 5 MG TABS tablet Take 5 mg by mouth 2 (two) times daily.   12/15/2023   ARIPiprazole  (ABILIFY ) 2 MG tablet Take 1 tablet (2 mg total) by mouth at bedtime.   12/15/2023   citalopram  (CELEXA ) 40 MG tablet Take 40 mg by mouth at bedtime.   12/15/2023   diazepam  (VALIUM ) 5 MG tablet Take 1 tablet (5 mg total) by mouth 2 (two) times daily as needed for anxiety or muscle spasms. 10 tablet 0 12/15/2023   insulin  glargine (LANTUS  SOLOSTAR) 100 UNIT/ML Solostar Pen Inject 20 Units into the skin daily.   12/15/2023   insulin  lispro (HUMALOG) 100 UNIT/ML injection Inject 5 Units into the skin 3 (three) times daily before meals.   12/15/2023   iron  polysaccharides (NIFEREX) 150 MG capsule Take 1 capsule (150 mg total) by mouth daily.   12/15/2023   levothyroxine  (SYNTHROID ) 100 MCG tablet Take 1 tablet (100 mcg total) by mouth daily before breakfast.   12/15/2023   Melatonin 10 MG TABS Take 10 mg by mouth.   12/15/2023   Multiple Vitamins-Minerals (CENTRUM  SILVER) CHEW Chew 2 each by mouth daily.   12/15/2023   rosuvastatin  (CRESTOR ) 10 MG tablet Take 1 tablet (10 mg total) by mouth daily. 30 tablet 0 12/15/2023   sennosides-docusate sodium  (SENOKOT-S) 8.6-50 MG tablet Take 2 tablets by mouth daily.   12/15/2023   insulin  aspart (NOVOLOG ) 100 UNIT/ML injection Inject 5 Units into the skin 3 (three) times daily with meals. (Patient not taking: Reported on 12/17/2023)   Not Taking    Assessment: 68 yo female with h/o PAF on apixaban  PTA admitted with AKI. MD wishes to convert to heparin  drip at this time. Last dose of apixaban  9/10 at ~1600.   9/14 AM update: -aPTT therapeutic at 70 secs on heparin  1100 units/hr -Heparin  level supratherapeutic at >1.1 -- will move to monitoring this daily as still not correlated with aPTT -Hgb and PLT WNL -Per RN, no signs of bleeding or pauses in infusion  PM: aPTT 73 on 1100 units/hr. No issues with the infusion or bleeding reported.  Goal of Therapy:  Heparin  level 0.3-0.7 units/ml aPTT 66-102 seconds Monitor platelets by anticoagulation protocol: Yes   Plan:  -Continue IV Heparin  1100 units/hr. -Monitor daily heparin  level and aPTT until correlation -Monitor Hgb, PLT and signs of bleeding -F/u switch back to apixaban  after liver biopsy tomorrow, 9/15  Rocky Slade, PharmD, BCPS 12/20/2023 3:38 PM  Please check AMION for all [facility] phone numbers After 10:00 PM, call Main Pharmacy [phone]

## 2023-12-20 NOTE — Plan of Care (Signed)
  Problem: Education: Goal: Ability to describe self-care measures that may prevent or decrease complications (Diabetes Survival Skills Education) will improve Outcome: Progressing   Problem: Coping: Goal: Ability to adjust to condition or change in health will improve Outcome: Progressing   Problem: Fluid Volume: Goal: Ability to maintain a balanced intake and output will improve Outcome: Progressing   Problem: Skin Integrity: Goal: Risk for impaired skin integrity will decrease Outcome: Progressing   Problem: Education: Goal: Knowledge of General Education information will improve Description: Including pain rating scale, medication(s)/side effects and non-pharmacologic comfort measures Outcome: Progressing   Problem: Pain Managment: Goal: General experience of comfort will improve and/or be controlled Outcome: Progressing

## 2023-12-20 NOTE — Progress Notes (Signed)
 PROGRESS NOTE                                                                                                                                                                                                             Patient Demographics:    [name], is a 68 y.o. female, DOB - 1956-02-02, FMW:[id number]  Outpatient Primary MD for the patient is Valma Carwin, MD    LOS - 3  Admit date - 12/15/2023    Chief Complaint  Patient presents with   Chest Pain       Brief Narrative (HPI from H&P)     68 y.o. 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    Subjective:   Patient in bed, appears comfortable, denies any headache, no fever, no chest pain or pressure, no shortness of breath , no abdominal pain feels nauseated and constipated last bowel movement more than 1 week ago. No new focal weakness.    Assessment  & Plan :   Respiratory insufficiency iso volume overload 2/2 LLL CAP and HFpEF exacerbation c/b worsening renal failure   Multifactorial SOB/cp but likely exacerbated by volume overload iso worsening renal failure  has significant volume overload, placed on IV diuretics per nephrology, shortness of breath and chest discomfort improved, continue to monitor with ongoing diuresis.   AKI on CKD (chronic kidney disease) stage 4, GFR 15-29 ml/min baseline creatinine around 2.6 -  Per nephrology   UTI and pneumonia on admission.  Empiric antibiotics.  Speech eval.  Supportive care with oxygen and nebulizer treatments.  Follow  cultures.  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  Follow-up with PCP for weight loss.  BMI of 57  HFpEF EF 60% on recent echocardiogram recent [facility] noted no significant CAD, PA pressure 62/16, wedge of 11, PVR 4.4, group 3 pulmonary hypertension suspected, Diuresis per nephrology.   Liver lesions c/f metastases  - IR consulted patient was on Eliquis  biopsy hencebiopsy is  scheduled for  12/21/23 .  Will check CA 19.9 and CEA levels as well.   Paroxysmal Afib  -anticoagulation currently heparin  drip post biopsy switch back to Eliquis    Hypothyroid  -PTA Synthroid    HTN  -PTA amlodipine    Constipation with nausea.  Last BM over 1 week ago, abdominal exam benign, x-ray stable, soapsuds enema on 12/20/2023, continue bowel regimen.    Chronic lymphedema - Venous stasis ulcer of LLE  -Continue wrapping BLE and Unna boots to BLE as well   MDD  -PTA Abilify , Celexa , and Valium   DM2 -PTA long acting insulin  20U daily and SSI TID AC prn   Lab Results  Component Value Date   HGBA1C 5.7 (H) 09/04/2023   .CBG (last 3)  Recent Labs    12/19/23 1608 12/19/23 2120 12/20/23 0725  GLUCAP 140* 152* 117*          Condition - Extremely Guarded  Family Communication  :  none  Code Status :  Full  Consults  :  Renal, IR  PUD Prophylaxis :     Procedures  :     CT Liver - 1. Hypoattenuating liver lesions with peripheral enhancement, largest measuring 7.9 cm in the inferior right hepatic lobe concerning for metastases. Differential consideration includes hepatic abscesses. Hepatic protocol MRI with and without IV contrast is recommended. 2. Small right pleural effusion and RLL pneumonia or atelectasis.      Disposition Plan  :    Status is: Inpatient   DVT Prophylaxis  :  Hep gtt   Lab Results  Component Value Date   PLT 163 12/20/2023    Diet :  Diet Order             Diet NPO time specified Except for: Sips with Meds  Diet effective  midnight           Diet Carb Modified Fluid consistency: Thin; Room service appropriate? Yes  Diet effective now                    Inpatient Medications  Scheduled Meds:  acetaminophen   1,000 mg Oral TID   amLODipine   10 mg Oral Daily   ARIPiprazole   2 mg Oral QHS   azithromycin   500 mg Oral Daily   Chlorhexidine  Gluconate Cloth  6 each Topical Daily   citalopram   20 mg Oral QHS   docusate sodium   200 mg Oral BID   furosemide   80 mg Intravenous BID   insulin  aspart  0-15 Units Subcutaneous TID WC   insulin  glargine  20 Units Subcutaneous Daily   lactulose   30 g Oral BID   levothyroxine   100 mcg Oral QAC breakfast   melatonin  10 mg Oral QHS   polyethylene glycol  17 g Oral BID   rosuvastatin   10 mg Oral Daily   Continuous Infusions:  ceFEPime  (MAXIPIME ) IV 2 g (12/19/23 1419)   heparin  1,100 Units/hr (12/20/23 0409)   promethazine  (PHENERGAN ) injection (IM or IVPB) 150 mL/hr at 12/20/23 0727   PRN Meds:.diazepam , HYDROmorphone  (DILAUDID ) injection, ondansetron  (ZOFRAN ) IV, mouth rinse, promethazine  (PHENERGAN ) injection (IM or IVPB)    Objective:   Vitals:   12/19/23 1610 12/19/23 2106 12/20/23 0044 12/20/23 0451  BP: 125/65 (!) 152/51 (!) 138/49 (!) 141/48  Pulse: (!) 50 (!) 50 (!) 50 (!) 50  Resp: 18  20 20   Temp: 97.9 F (36.6 C) 98.1 F (36.7 C) 98.6 F (37 C) 98.1 F (36.7 C)  TempSrc:  Oral Oral Oral Oral  SpO2:   98% 99%  Weight:      Height:        Wt Readings from Last 3 Encounters:  12/15/23 (!) 138.8 kg  12/05/23 (!) 137.2 kg  11/05/23 136.1 kg     Intake/Output Summary (Last 24 hours) at 12/20/2023 0850 Last data filed at 12/20/2023 0727 Gross per 24 hour  Intake 696.03 ml  Output 650 ml  Net 46.03 ml     Physical Exam  Awake Alert, No new F.N deficits, Normal affect [place].AT,PERRAL Supple Neck, No JVD,   Symmetrical Chest wall movement, Good air movement bilaterally, CTAB RRR,No Gallops,Rubs or new Murmurs,  +ve B.Sounds, Abd  Soft, No tenderness,   2+ edema, both lower extremities under bandage    Data Review:    Recent Labs  Lab 12/15/23 2054 12/18/23 0248 12/19/23 0227 12/20/23 0704  WBC 8.1 6.0 5.6 6.8  HGB 8.7* 7.7* 7.6* 8.3*  HCT 29.8* 25.6* 26.0* 27.7*  PLT 154 133* 146* 163  MCV 104.6* 102.0* 102.8* 101.8*  MCH 30.5 30.7 30.0 30.5  MCHC 29.2* 30.1 29.2* 30.0  RDW 16.6* 16.7* 16.9* 16.9*  LYMPHSABS 0.4* 0.5* 0.4* 0.5*  MONOABS 0.6 0.6 0.7 0.8  EOSABS 0.1 0.2 0.2 0.1  BASOSABS 0.0 0.0 0.0 0.0    Recent Labs  Lab 12/15/23 2054 12/16/23 0013 12/17/23 0209 12/18/23 0248 12/19/23 0227 12/19/23 0952 12/20/23 0704  NA 136  --  137 135 135  --  135  K 4.6  --  4.9 4.8 4.5  --  4.9  CL 98  --  100 100 100  --  100  CO2 24  --  25 23 24   --  22  ANIONGAP 14  --  12 12 11   --  13  GLUCOSE 225*  --  125* 125* 95  --  118*  BUN 135*  --  135* 138* 140*  --  143*  CREATININE 3.65*  --  3.38* 3.46* 3.55*  --  3.77*  AST 23  --   --  17 18  --  26  ALT 16  --   --  14 14  --  17  ALKPHOS 79  --   --  63 60  --  69  BILITOT 0.5  --   --  0.6 0.5  --  0.6  ALBUMIN  3.1*  --  2.7* 2.6* 2.9*  --  3.1*  LATICACIDVEN  --  1.0  --   --   --   --   --   INR  --   --  2.1*  --   --  1.6*  --   BNP 583.7*  --   --   --   --   --   --   MG  --   --   --  2.7* 2.8*  --  2.8*  PHOS  --   --  5.5*  --   --   --   --   CALCIUM  8.6*  --  8.4* 8.4* 8.4*  --  8.6*      Recent Labs  Lab 12/15/23 2054 12/16/23 0013 12/17/23 0209 12/18/23 0248 12/19/23 0227 12/19/23 0952 12/20/23 0704  LATICACIDVEN  --  1.0  --   --   --   --   --   INR  --   --  2.1*  --   --  1.6*  --   BNP 583.7*  --   --   --   --   --   --  MG  --   --   --  2.7* 2.8*  --  2.8*  CALCIUM  8.6*  --  8.4* 8.4* 8.4*  --  8.6*    --------------------------------------------------------------------------------------------------------------- Lab Results  Component Value Date   CHOL 105 09/03/2023   HDL 50 09/03/2023   LDLCALC  44 09/03/2023   TRIG 56 09/03/2023   CHOLHDL 2.1 09/03/2023    Lab Results  Component Value Date   HGBA1C 5.7 (H) 09/04/2023   No results for input(s): TSH, T4TOTAL, FREET4, T3FREE, THYROIDAB in the last 72 hours. No results for input(s): VITAMINB12, FOLATE, FERRITIN, TIBC, IRON , RETICCTPCT in the last 72 hours.    Micro Results Recent Results (from the past 240 hours)  Urine Culture     Status: Abnormal   Collection Time: 12/16/23 12:13 AM   Specimen: Urine, Clean Catch  Result Value Ref Range Status   Specimen Description URINE, CLEAN CATCH  Final   Special Requests   Final    NONE Performed at [facility], [address] [place], [place] [address]    Culture 80,000 COLONIES/mL CITROBACTER FREUNDII (A)  Final   Report Status 12/19/2023 FINAL  Final   Organism ID, Bacteria CITROBACTER FREUNDII (A)  Final      Susceptibility   Citrobacter freundii - MIC*    CEFEPIME  <=0.12 SENSITIVE Sensitive     ERTAPENEM <=0.12 SENSITIVE Sensitive     CEFTRIAXONE  32 RESISTANT Resistant     CIPROFLOXACIN  <=0.06 SENSITIVE Sensitive     GENTAMICIN  <=1 SENSITIVE Sensitive     NITROFURANTOIN <=16 SENSITIVE Sensitive     TRIMETH /SULFA  >=320 RESISTANT Resistant     PIP/TAZO Value in next row Sensitive ug/mL     <=4 SENSITIVEThis is a modified FDA-approved test that has been validated and its performance characteristics determined by the reporting laboratory.  This laboratory is certified under the Clinical Laboratory Improvement Amendments CLIA as qualified to perform high complexity clinical laboratory testing.    MEROPENEM Value in next row Sensitive      <=4 SENSITIVEThis is a modified FDA-approved test that has been validated and its performance characteristics determined by the reporting laboratory.  This laboratory is certified under the Clinical Laboratory Improvement Amendments CLIA as qualified to perform high complexity clinical laboratory testing.    *  80,000 COLONIES/mL CITROBACTER FREUNDII  Blood culture (routine x 2)     Status: None (Preliminary result)   Collection Time: 12/16/23 12:13 AM   Specimen: BLOOD  Result Value Ref Range Status   Specimen Description BLOOD SITE NOT SPECIFIED  Final   Special Requests   Final    BOTTLES DRAWN AEROBIC AND ANAEROBIC Blood Culture results may not be optimal due to an inadequate volume of blood received in culture bottles   Culture   Final    NO GROWTH 4 DAYS Performed at [facility], [address] [place], [place] [address]    Report Status PENDING  Incomplete  Blood culture (routine x 2)     Status: None (Preliminary result)   Collection Time: 12/16/23 12:13 AM   Specimen: BLOOD  Result Value Ref Range Status   Specimen Description BLOOD SITE NOT SPECIFIED  Final   Special Requests   Final    BOTTLES DRAWN AEROBIC AND ANAEROBIC Blood Culture adequate volume   Culture   Final    NO GROWTH 4 DAYS Performed at [facility], [address], [place], [place] [address]    Report Status PENDING  Incomplete    Radiology Report DG Abd 1 View Result Date: 12/20/2023 CLINICAL DATA:  68 year old female with constipation. EXAM: ABDOMEN - 1 VIEW COMPARISON:  CT Abdomen and Pelvis 12/16/2023 and earlier. FINDINGS: AP supine views at 0759 hours. Four views. Non obstructed bowel gas pattern. IVC filter redemonstrated. Stable cholecystectomy clips. Moderate volume retained rectosigmoid colon stool. Mild large bowel retained stool elsewhere. Stable visualized osseous structures. Advanced lower lumbar disc and endplate degeneration. Grossly negative lung bases, cardiac pacemaker lead redemonstrated. IMPRESSION: Non obstructed bowel gas pattern with moderate volume retained stool, primarily in the rectosigmoid. Electronically Signed   By: VEAR Hurst M.D.   On: 12/20/2023 08:19      Signature  -   Lavada Stank M.D on 12/20/2023 at 8:50 AM   -  To page go to [email or web address]

## 2023-12-21 ENCOUNTER — Inpatient Hospital Stay (HOSPITAL_COMMUNITY)

## 2023-12-21 DIAGNOSIS — J189 Pneumonia, unspecified organism: Secondary | ICD-10-CM | POA: Diagnosis not present

## 2023-12-21 HISTORY — PX: IR TUNNELED CENTRAL VENOUS CATH PLC W IMG: IMG1939

## 2023-12-21 LAB — CBC WITH DIFFERENTIAL/PLATELET
Abs Immature Granulocytes: 0.04 K/uL (ref 0.00–0.07)
Basophils Absolute: 0 K/uL (ref 0.0–0.1)
Basophils Relative: 0 %
Eosinophils Absolute: 0.2 K/uL (ref 0.0–0.5)
Eosinophils Relative: 2 %
HCT: 27.9 % — ABNORMAL LOW (ref 36.0–46.0)
Hemoglobin: 8.4 g/dL — ABNORMAL LOW (ref 12.0–15.0)
Immature Granulocytes: 1 %
Lymphocytes Relative: 6 %
Lymphs Abs: 0.5 K/uL — ABNORMAL LOW (ref 0.7–4.0)
MCH: 30.8 pg (ref 26.0–34.0)
MCHC: 30.1 g/dL (ref 30.0–36.0)
MCV: 102.2 fL — ABNORMAL HIGH (ref 80.0–100.0)
Monocytes Absolute: 0.8 K/uL (ref 0.1–1.0)
Monocytes Relative: 10 %
Neutro Abs: 7 K/uL (ref 1.7–7.7)
Neutrophils Relative %: 81 %
Platelets: 150 K/uL (ref 150–400)
RBC: 2.73 MIL/uL — ABNORMAL LOW (ref 3.87–5.11)
RDW: 16.9 % — ABNORMAL HIGH (ref 11.5–15.5)
WBC: 8.5 K/uL (ref 4.0–10.5)
nRBC: 0 % (ref 0.0–0.2)

## 2023-12-21 LAB — COMPREHENSIVE METABOLIC PANEL WITH GFR
ALT: 18 U/L (ref 0–44)
AST: 27 U/L (ref 15–41)
Albumin: 3 g/dL — ABNORMAL LOW (ref 3.5–5.0)
Alkaline Phosphatase: 71 U/L (ref 38–126)
Anion gap: 16 — ABNORMAL HIGH (ref 5–15)
BUN: 147 mg/dL — ABNORMAL HIGH (ref 8–23)
CO2: 21 mmol/L — ABNORMAL LOW (ref 22–32)
Calcium: 8.6 mg/dL — ABNORMAL LOW (ref 8.9–10.3)
Chloride: 100 mmol/L (ref 98–111)
Creatinine, Ser: 4.07 mg/dL — ABNORMAL HIGH (ref 0.44–1.00)
GFR, Estimated: 11 mL/min — ABNORMAL LOW (ref >60)
Glucose, Bld: 96 mg/dL (ref 70–99)
Potassium: 5.1 mmol/L (ref 3.5–5.1)
Sodium: 137 mmol/L (ref 135–145)
Total Bilirubin: 0.6 mg/dL (ref 0.0–1.2)
Total Protein: 5.4 g/dL — ABNORMAL LOW (ref 6.5–8.1)

## 2023-12-21 LAB — MAGNESIUM: Magnesium: 2.9 mg/dL — ABNORMAL HIGH (ref 1.7–2.4)

## 2023-12-21 LAB — CULTURE, BLOOD (ROUTINE X 2)
Culture: NO GROWTH
Culture: NO GROWTH
Special Requests: ADEQUATE

## 2023-12-21 LAB — GLUCOSE, CAPILLARY
Glucose-Capillary: 107 mg/dL — ABNORMAL HIGH (ref 70–99)
Glucose-Capillary: 120 mg/dL — ABNORMAL HIGH (ref 70–99)
Glucose-Capillary: 121 mg/dL — ABNORMAL HIGH (ref 70–99)
Glucose-Capillary: 265 mg/dL — ABNORMAL HIGH (ref 70–99)

## 2023-12-21 LAB — APTT: aPTT: 74 s — ABNORMAL HIGH (ref 24–36)

## 2023-12-21 LAB — HEPARIN LEVEL (UNFRACTIONATED): Heparin Unfractionated: >1.1 [IU]/mL — ABNORMAL HIGH (ref 0.30–0.70)

## 2023-12-21 LAB — PROTIME-INR
INR: 1.5 — ABNORMAL HIGH (ref 0.8–1.2)
Prothrombin Time: 19.1 s — ABNORMAL HIGH (ref 11.4–15.2)

## 2023-12-21 MED ORDER — CEFAZOLIN SODIUM-DEXTROSE 2-4 GM/100ML-% IV SOLN
INTRAVENOUS | Status: AC | PRN
  Administered 2023-12-21: 1 g via INTRAVENOUS

## 2023-12-21 MED ORDER — LIDOCAINE-EPINEPHRINE 1 %-1:100000 IJ SOLN
20.0000 mL | Freq: Once | INTRAMUSCULAR | Status: AC
  Administered 2023-12-21: 20 mL

## 2023-12-21 MED ORDER — CEFAZOLIN SODIUM-DEXTROSE 2-4 GM/100ML-% IV SOLN
2.0000 g | Freq: Once | INTRAVENOUS | Status: AC
  Administered 2023-12-21: 2 g via INTRAVENOUS
  Filled 2023-12-21: qty 100

## 2023-12-21 MED ORDER — HEPARIN SODIUM (PORCINE) 1000 UNIT/ML IJ SOLN
1000.0000 [IU] | Freq: Once | INTRAMUSCULAR | Status: AC
  Filled 2023-12-21: qty 1

## 2023-12-21 MED ORDER — MIDAZOLAM HCL 2 MG/2ML IJ SOLN
INTRAMUSCULAR | Status: AC | PRN
  Administered 2023-12-21: 1 mg via INTRAVENOUS

## 2023-12-21 MED ORDER — FENTANYL CITRATE (PF) 100 MCG/2ML IJ SOLN
INTRAMUSCULAR | Status: AC | PRN
  Administered 2023-12-21: 50 ug via INTRAVENOUS

## 2023-12-21 MED ORDER — FENTANYL CITRATE (PF) 100 MCG/2ML IJ SOLN
INTRAMUSCULAR | Status: AC
  Filled 2023-12-21: qty 2

## 2023-12-21 MED ORDER — NALOXEGOL OXALATE 25 MG PO TABS
25.0000 mg | ORAL_TABLET | Freq: Every day | ORAL | Status: DC
  Administered 2023-12-21 – 2023-12-25 (×4): 25 mg via ORAL
  Filled 2023-12-21 (×5): qty 1

## 2023-12-21 MED ORDER — LIDOCAINE HCL (PF) 1 % IJ SOLN: 5.0000 mL | INTRAMUSCULAR | Status: DC | PRN

## 2023-12-21 MED ORDER — MIDAZOLAM HCL 2 MG/2ML IJ SOLN
INTRAMUSCULAR | Status: AC
  Filled 2023-12-21: qty 2

## 2023-12-21 MED ORDER — CEFAZOLIN SODIUM-DEXTROSE 2-4 GM/100ML-% IV SOLN
INTRAVENOUS | Status: AC
  Filled 2023-12-21: qty 100

## 2023-12-21 MED ORDER — ANTICOAGULANT SODIUM CITRATE 4% (200MG/5ML) IV SOLN: 5.0000 mL | Status: DC | PRN

## 2023-12-21 MED ORDER — ALTEPLASE 2 MG IJ SOLR: 2.0000 mg | Freq: Once | INTRAMUSCULAR | Status: DC | PRN

## 2023-12-21 MED ORDER — HEPARIN SODIUM (PORCINE) 1000 UNIT/ML IJ SOLN
INTRAMUSCULAR | Status: AC
  Filled 2023-12-21: qty 4

## 2023-12-21 MED ORDER — PENTAFLUOROPROP-TETRAFLUOROETH EX AERO: 1.0000 | INHALATION_SPRAY | CUTANEOUS | Status: DC | PRN

## 2023-12-21 MED ORDER — HEPARIN SODIUM (PORCINE) 1000 UNIT/ML DIALYSIS
1000.0000 [IU] | INTRAMUSCULAR | Status: DC | PRN
  Administered 2023-12-21: 1000 [IU]

## 2023-12-21 MED ORDER — SODIUM ZIRCONIUM CYCLOSILICATE 10 G PO PACK
10.0000 g | PACK | Freq: Once | ORAL | Status: AC
  Administered 2023-12-21: 10 g via ORAL
  Filled 2023-12-21: qty 1

## 2023-12-21 MED ORDER — HEPARIN SODIUM (PORCINE) 1000 UNIT/ML IJ SOLN
INTRAMUSCULAR | Status: AC
  Filled 2023-12-21: qty 10

## 2023-12-21 MED ORDER — CHLORHEXIDINE GLUCONATE CLOTH 2 % EX PADS
6.0000 | MEDICATED_PAD | Freq: Every day | CUTANEOUS | Status: DC
Start: 2023-12-22 — End: 2023-12-30
  Administered 2023-12-22 – 2023-12-30 (×9): 6 via TOPICAL

## 2023-12-21 MED ORDER — LIDOCAINE-PRILOCAINE 2.5-2.5 % EX CREA: 1.0000 | TOPICAL_CREAM | CUTANEOUS | Status: DC | PRN

## 2023-12-21 NOTE — Consult Note (Signed)
 Chief Complaint: AKI. Request is for [facility] Placement  Referring Physician(s): Dr. JONETTA Romney  Supervising Physician: Luverne Aran  Patient Status: [facility] - In-pt  History of Present Illness: [name] is a 68 y.o. female. History of Afib on Eliquis , Heart failure, DVT/PE s/p IVC filter in 2011, CKD stage IV, and morbid obesity who presented to the ED with shortness of breath and volume overload. Found to be in AKI. Team is requesting a TDC catheter.  Endorses nausea Patient alert and laying in bed,calm. Denies any fevers, headache, chest pain, SOB, cough, abdominal pain,  vomiting or bleeding.   BUN 147 , Cr 4.07, albumin  3.0, GFR < 11. Patient is on a heparin  gtt. Allergies include morphine. Patient has been NPO since midnight.   Return precautions and treatment recommendations and follow-up discussed with the patient  who is agreeable with the plan.    Past Medical History:  Diagnosis Date   A-fib (HCC)    Anemia    Anticoagulated on Coumadin , chronically 09/03/2011   Anxiety    Arthritis    right hip; both knees; left wrist/shoulder; back (01/19/2013   Bleeding on Coumadin  08/2012; 01/18/2013   [facility] admissions (01/19/2013)   CHF (congestive heart failure) (HCC)    2-3 times (01/19/2013)   Chronic lower back pain    Depression    DVT (deep venous thrombosis) (HCC) 10 years ago   numerous/notes 01/18/2013   GERD (gastroesophageal reflux disease)    Gout    Headache(784.0)    maybe weekly (01/19/2013)   Heart murmur    High cholesterol    been off RX for this at one time (01/18/2013)   History of blood transfusion 1983; 04/2012   3 w/ childbirth; hospitalized for pain (01/19/2013)   Hypertension    Hypothyroidism    Migraines    twice/yr maybe (01/19/2013)   Obstructive sleep apnea 05/03/2012   OSA (obstructive sleep apnea)    sent me for test in 04/2012; never ordered mask, etc (01/19/2013)   PE (pulmonary thromboembolism) (HCC) 3 years ag0    3/notes 01/18/2013   Pneumonia before 2011   once' (01/18/2013)   Renal disorder    kindey function low; Metformin was destroying my kidneys (01/19/2013)   Shortness of breath    only related to my CHF (01/18/2013)   Swelling of hand 08/31/2014   RT HAND   Type II diabetes mellitus (HCC)    UTI (urinary tract infection) 08/31/2014    Past Surgical History:  Procedure Laterality Date   CARDIAC CATHETERIZATION N/A 03/13/2015   Procedure: Right/Left Heart Cath and Coronary Angiography;  Surgeon: Gordy Bergamo, MD;  Location: [facility] INVASIVE CV LAB;  Service: Cardiovascular;  Laterality: N/A;   CATARACT EXTRACTION W/ INTRAOCULAR LENS  IMPLANT, BILATERAL Bilateral 2006-2011   CESAREAN SECTION  1983   CHOLECYSTECTOMY  ~ 2002   COLONOSCOPY N/A 01/21/2013   Procedure: COLONOSCOPY;  Surgeon: Belvie JONETTA Just, MD;  Location: [facility] ENDOSCOPY;  Service: Endoscopy;  Laterality: N/A;   EYE SURGERY Bilateral    multiple (01/18/2013)   INCISION AND DRAINAGE OF WOUND Right 12/01/2023   Procedure: IRRIGATION AND DEBRIDEMENT WOUND;  Surgeon: Desiderio Schanz, MD;  Location: [facility] ORS;  Service: General;  Laterality: Right;   PACEMAKER IMPLANT N/A 08/31/2018   Symptomatic bradycardia due to mobitz II second degree AV block, permanent afib/ atypical atrial flutter implanted by Dr Kelsie   PARS PLANA REPAIR OF RETINAL DEATACHMENT Right    PARS PLANA VITRECTOMY Bilateral 2004-2006  several (01/18/2013)   REFRACTIVE SURGERY Bilateral    for stigmatism (01/18/2013)   REFRACTIVE SURGERY Left ~ 11/2012   to puff it up cause vision got hazy (01/18/2013)   RIGHT HEART CATH N/A 12/30/2018   Procedure: RIGHT HEART CATH;  Surgeon: Cherrie Toribio SAUNDERS, MD;  Location: [facility] INVASIVE CV LAB;  Service: Cardiovascular;  Laterality: N/A;   RIGHT/LEFT HEART CATH AND CORONARY ANGIOGRAPHY N/A 09/07/2023   Procedure: RIGHT/LEFT HEART CATH AND CORONARY ANGIOGRAPHY;  Surgeon: Wendel Lurena POUR, MD;  Location: [facility] INVASIVE CV LAB;   Service: Cardiovascular;  Laterality: N/A;   VENA CAVA FILTER PLACEMENT  2011?    Allergies: Ms contin [morphine]  Medications: Prior to Admission medications   Medication Sig Start Date End Date Taking? Authorizing Provider  allopurinol  (ZYLOPRIM ) 100 MG tablet Take 50 mg by mouth every other day. 10/28/23  Yes [provider]  amLODipine  (NORVASC ) 5 MG tablet Take 2 tablets (10 mg total) by mouth daily. 10/26/23  Yes Milford, Harlene HERO, FNP  apixaban  (ELIQUIS ) 5 MG TABS tablet Take 5 mg by mouth 2 (two) times daily.   Yes [provider]  ARIPiprazole  (ABILIFY ) 2 MG tablet Take 1 tablet (2 mg total) by mouth at bedtime. 12/06/23  Yes Jens Durand, MD  citalopram  (CELEXA ) 40 MG tablet Take 40 mg by mouth at bedtime. 10/28/23  Yes [provider]  diazepam  (VALIUM ) 5 MG tablet Take 1 tablet (5 mg total) by mouth 2 (two) times daily as needed for anxiety or muscle spasms. 12/06/23  Yes Jens Durand, MD  insulin  glargine (LANTUS  SOLOSTAR) 100 UNIT/ML Solostar Pen Inject 20 Units into the skin daily.   Yes [provider]  insulin  lispro (HUMALOG) 100 UNIT/ML injection Inject 5 Units into the skin 3 (three) times daily before meals.   Yes [provider]  iron  polysaccharides (NIFEREX) 150 MG capsule Take 1 capsule (150 mg total) by mouth daily. 12/07/23  Yes Jens Durand, MD  levothyroxine  (SYNTHROID ) 100 MCG tablet Take 1 tablet (100 mcg total) by mouth daily before breakfast. 12/07/23  Yes Jens Durand, MD  Melatonin 10 MG TABS Take 10 mg by mouth.   Yes [provider]  Multiple Vitamins-Minerals (CENTRUM SILVER) CHEW Chew 2 each by mouth daily.   Yes [provider]  rosuvastatin  (CRESTOR ) 10 MG tablet Take 1 tablet (10 mg total) by mouth daily. 05/09/16  Yes Epifanio Rolland Robin, MD  sennosides-docusate sodium  (SENOKOT-S) 8.6-50 MG tablet Take 2 tablets by mouth daily.   Yes [provider]  insulin  aspart (NOVOLOG )  100 UNIT/ML injection Inject 5 Units into the skin 3 (three) times daily with meals. Patient not taking: Reported on 12/17/2023 12/06/23   Jens Durand, MD     Family History  Problem Relation Age of Onset   Cerebral aneurysm Mother    Hypertension Father    Cerebral aneurysm Maternal Grandfather    Cerebral aneurysm Maternal Aunt    Cancer Maternal Uncle     Social History   Socioeconomic History   Marital status: Single    Spouse name: Not on file   Number of children: Not on file   Years of education: Not on file   Highest education level: Not on file  Occupational History   Occupation: disabled  Tobacco Use   Smoking status: Former    Current packs/day: 0.00    Average packs/day: 0.1 packs/day for 30.0 years (1.5 ttl pk-yrs)    Types: Cigarettes    Start date:  1960    Quit date: 85    Years since quitting: 35.7   Smokeless tobacco: Never   Tobacco comments:    01/19/2013 quit smoking cigarettes in the early '90's  Vaping Use   Vaping status: Never Used  Substance and Sexual Activity   Alcohol  use: Not Currently    Comment: rare now but never a heavy drinker   Drug use: No   Sexual activity: Never  Other Topics Concern   Not on file  Social History Narrative   Not on file   Social Drivers of Health   Financial Resource Strain: Not on file  Food Insecurity: No Food Insecurity (12/16/2023)   Hunger Vital Sign    Worried About Running Out of Food in the Last Year: Never true    Ran Out of Food in the Last Year: Never true  Transportation Needs: No Transportation Needs (12/16/2023)   PRAPARE - Administrator, Civil Service (Medical): No    Lack of Transportation (Non-Medical): No  Physical Activity: Not on file  Stress: Not on file  Social Connections: Moderately Isolated (12/16/2023)   Social Connection and Isolation Panel    Frequency of Communication with Friends and Family: More than three times a week    Frequency of Social Gatherings with  Friends and Family: Never    Attends Religious Services: More than 4 times per year    Active Member of [employer] or Organizations: No    Attends Banker Meetings: Never    Marital Status: Divorced     Review of Systems: A 12 point ROS discussed and pertinent positives are indicated in the HPI above.  All other systems are negative.  Review of Systems  Constitutional:  Negative for fatigue and fever.  HENT:  Negative for congestion.   Respiratory:  Negative for cough and shortness of breath.   Gastrointestinal:  Positive for nausea. Negative for abdominal pain, diarrhea and vomiting.    Vital Signs: BP (!) 142/55 (BP Location: Right Arm)   Pulse (!) 50   Temp 97.8 F (36.6 C) (Oral)   Resp 18   Ht 5' 1 (1.549 m)   Wt (!) 306 lb (138.8 kg)   SpO2 99%   BMI 57.82 kg/m   Advance Care Plan: The advanced care plan/surrogate decision maker was discussed at the time of visit and the patient did not wish to discuss or was not able to name a surrogate decision maker or provide an advance care plan.    Physical Exam Vitals and nursing note reviewed.  Constitutional:      Appearance: She is well-developed. She is obese.  HENT:     Head: Normocephalic and atraumatic.  Eyes:     Conjunctiva/sclera: Conjunctivae normal.  Cardiovascular:     Rate and Rhythm: Bradycardia present.  Pulmonary:     Effort: Pulmonary effort is normal.  Musculoskeletal:        General: Normal range of motion.     Cervical back: Normal range of motion.  Skin:    General: Skin is warm and dry.  Neurological:     General: No focal deficit present.     Mental Status: She is alert and oriented to person, place, and time.  Psychiatric:        Mood and Affect: Mood normal.     Imaging: DG Abd 1 View Result Date: 12/20/2023 CLINICAL DATA:  68 year old female with constipation. EXAM: ABDOMEN - 1 VIEW COMPARISON:  CT Abdomen and  Pelvis 12/16/2023 and earlier. FINDINGS: AP supine views at 0759  hours. Four views. Non obstructed bowel gas pattern. IVC filter redemonstrated. Stable cholecystectomy clips. Moderate volume retained rectosigmoid colon stool. Mild large bowel retained stool elsewhere. Stable visualized osseous structures. Advanced lower lumbar disc and endplate degeneration. Grossly negative lung bases, cardiac pacemaker lead redemonstrated. IMPRESSION: Non obstructed bowel gas pattern with moderate volume retained stool, primarily in the rectosigmoid. Electronically Signed   By: VEAR Hurst M.D.   On: 12/20/2023 08:19   CT LIVER ABDOMEN W WO CONTRAST Result Date: 12/16/2023 EXAM: CT ABDOMEN WITH AND WITHOUT CONTRAST 12/16/2023 08:01:00 PM TECHNIQUE: CT of the abdomen was performed with the administration of intravenous contrast. Multiplanar reformatted images are provided for review. Automated exposure control, iterative reconstruction, and/or weight based adjustment of the mA/kV was utilized to reduce the radiation dose to as low as reasonably achievable. COMPARISON: CT 12/15/2023 CLINICAL HISTORY: Liver lesion, > 1cm. FINDINGS: LOWER CHEST: Small right pleural effusion and pneumonia or atelectasis. HEPATOBILIARY: Redemonstrated hypoattenuating lesions throughout the liver with peripheral enhancement on the portal venous phase in the right base. The largest lesion is in the inferior right hepatic lobe measuring 7.9 cm (series 8 image 92). Differential considerations include metastases or hepatic abscesses. Cholecystectomy. SPLEEN: Spleen demonstrates no acute abnormality. PANCREAS: Pancreas demonstrates no acute abnormality. ADRENAL GLANDS: Adrenal glands demonstrate no acute abnormality. KIDNEYS: Nonobstructing left nephrolithiasis. No hydronephrosis. No perinephric or periureteral stranding. GI AND BOWEL: Stomach and duodenal sweep demonstrate no acute abnormality. There is no bowel obstruction. No abnormal bowel wall thickening or distension. PERITONEUM AND RETROPERITONEUM: No ascites or free  air. Aorta is normal in caliber. IVC filter. LYMPH NODES: No lymphadenopathy. BONES AND SOFT TISSUES: No acute abnormality of the visualized bones. Body wall edema. IMPRESSION: 1. Hypoattenuating liver lesions with peripheral enhancement, largest measuring 7.9 cm in the inferior right hepatic lobe concerning for metastases. Differential consideration includes hepatic abscesses. Hepatic protocol MRI with and without IV contrast is recommended. 2. Small right pleural effusion and RLL pneumonia or atelectasis. Electronically signed by: Norman Gatlin MD 12/16/2023 08:53 PM EDT RP Workstation: [id number]   CT Renal Stone Study Result Date: 12/15/2023 EXAM: CT UROGRAM 12/15/2023 11:01:37 PM TECHNIQUE: CT of the abdomen and pelvis was performed before and after the administration of intravenous contrast as per CT urogram protocol. Multiplanar reformatted images as well as MIP urogram images are provided for review. Automated exposure control, iterative reconstruction, and/or weight based adjustment of the mA/kV was utilized to reduce the radiation dose to as low as reasonably achievable. COMPARISON: CT 12/25/2012 CLINICAL HISTORY: Abdominal/flank pain, stone suspected. FINDINGS: LOWER CHEST: Small right pleural effusions. Right lower lobe airspace opacity suspicious for pneumonia. LIVER: Multiple hypoattenuating lesions throughout the liver were not previously seen and are concerning for metastases. These are incompletely assessed without IV contrast. The largest measures approximately 7.7 cm in the inferior right hepatic lobe (series 3, image 36). GALLBLADDER AND BILE DUCTS: Cholecystectomy. SPLEEN: No acute abnormality. PANCREAS: No acute abnormality. ADRENAL GLANDS: No acute abnormality. KIDNEYS, URETERS AND BLADDER: Nonobstructing left nephrolithiasis. No hydronephrosis. No perinephric or periureteral stranding. Urinary bladder is unremarkable. GI AND BOWEL: Stomach demonstrates no acute abnormality. There is no  bowel obstruction. PERITONEUM AND RETROPERITONEUM: No ascites. No free air. VASCULATURE: Similar configuration of the IVC filter. Aortic atherosclerotic calcification. LYMPH NODES: No lymphadenopathy. REPRODUCTIVE ORGANS: No acute abnormality. BONES AND SOFT TISSUES: No acute osseous abnormality. Diffuse edema in the bilateral flanks and anterior abdominal wall pannus. IMPRESSION: 1. Multiple hypoattenuating  liver lesions concerning for metastases, incompletely assessed without IV contrast. Largest measures approximately 7.7 cm in the inferior right hepatic lobe. Further evaluation with CT with IV contrast is recommended. 2. Right lower lobe airspace opacity suspicious for pneumonia. 3. Nonobstructing left nephrolithiasis. No hydronephrosis. Electronically signed by: Norman Gatlin MD 12/15/2023 11:10 PM EDT RP Workstation: [id number]   DG Chest 2 View Result Date: 12/15/2023 CLINICAL DATA:  Chest pain. EXAM: CHEST - 2 VIEW COMPARISON:  Chest radiograph dated 11/11/2023 FINDINGS: There is cardiomegaly with vascular congestion. No focal consolidation, pleural effusion or pneumothorax. Left pectoral pacemaker device. Atherosclerotic calcification of the aorta. No acute osseous pathology. IMPRESSION: Cardiomegaly with vascular congestion.  Pneumonia is not excluded. Electronically Signed   By: Vanetta Chou M.D.   On: 12/15/2023 21:38   US  RT UPPER EXTREM LTD SOFT TISSUE NON VASCULAR Result Date: 11/30/2023 CLINICAL DATA:  Abscess. Axillary abscess and status post bedside I and D. Evaluate for a persistent abscess. EXAM: ULTRASOUND RIGHT UPPER EXTREMITY LIMITED TECHNIQUE: Ultrasound examination of the upper extremity soft tissues was performed in the area of clinical concern. COMPARISON:  None Available. FINDINGS: Poorly defined heterogeneous hypoechoic area at the area of concern in the right axilla. This area roughly measures 1.9 x 1.0 x 1.6 cm. IMPRESSION: Poorly defined superficial heterogeneous area at the  area of concern in the right axilla. This area measures up to 1.9 cm. This could represent a small abscess or phlegmon. Electronically Signed   By: Juliene Balder M.D.   On: 11/30/2023 16:29    Labs:  CBC: Recent Labs    12/18/23 0248 12/19/23 0227 12/20/23 0704 12/21/23 0452  WBC 6.0 5.6 6.8 8.5  HGB 7.7* 7.6* 8.3* 8.4*  HCT 25.6* 26.0* 27.7* 27.9*  PLT 133* 146* 163 150    COAGS: Recent Labs    09/07/23 0522 12/17/23 0209 12/17/23 1924 12/19/23 0952 12/19/23 1226 12/19/23 2055 12/20/23 0704 12/20/23 1502 12/20/23 2345 12/21/23 0452  INR 1.3* 2.1*  --  1.6*  --   --   --   --  1.5*  --   APTT  --   --    < >  --    < > 49* 70* 73*  --  74*   < > = values in this interval not displayed.    BMP: Recent Labs    12/18/23 0248 12/19/23 0227 12/20/23 0704 12/21/23 0452  NA 135 135 135 137  K 4.8 4.5 4.9 5.1  CL 100 100 100 100  CO2 23 24 22  21*  GLUCOSE 125* 95 118* 96  BUN 138* 140* 143* 147*  CALCIUM  8.4* 8.4* 8.6* 8.6*  CREATININE 3.46* 3.55* 3.77* 4.07*  GFRNONAA 14* 14* 13* 11*    LIVER FUNCTION TESTS: Recent Labs    12/18/23 0248 12/19/23 0227 12/20/23 0704 12/21/23 0452  BILITOT 0.6 0.5 0.6 0.6  AST 17 18 26 27   ALT 14 14 17 18   ALKPHOS 63 60 69 71  PROT 5.1* 5.3* 4.7* 5.4*  ALBUMIN  2.6* 2.9* 3.1* 3.0*    TUMOR MARKERS: No results for input(s): AFPTM, CEA, CA199, CHROMGRNA in the last 8760 hours.  Assessment and Plan:  68 y.o. female inpatient. History of Afib on Eliquis , Heart failure, DVT/PE s/p IVC filter in 2011, CKD stage IV, and morbid obesity who presented to the ED with shortness of breath and volume overload. Found to be in AKI. Team is requesting a tunneled dialysis catheter.   PLAN: IR Image Guided Tunneled Hemodialysis  Catheter Placement  Risks and benefits discussed with the patient including, but not limited to bleeding, infection, vascular injury, pneumothorax which may require chest tube placement, air embolism or  even death  All of the patient's questions were answered, patient is agreeable to proceed. Consent signed and in chart.     Thank you for this interesting consult.  I greatly enjoyed meeting VALEREE LEIDY and look forward to participating in their care.  A copy of this report was sent to the requesting provider on this date.  Electronically Signed: Delon JAYSON Beagle, NP 12/21/2023, 8:17 AM   I spent a total of 20 Minutes    in face to face in clinical consultation, greater than 50% of which was counseling/coordinating care for [facility] placement

## 2023-12-21 NOTE — TOC Progression Note (Signed)
 Transition of Care [facility]) - Progression Note    Patient Details  Name: [name] MRN: [id number] Date of Birth: Jun 24, 1955  [facility] of [facility] CM/SW Contact  Inocente GORMAN Kindle, LCSW Phone Number: 12/21/2023, 8:35 AM  Clinical Narrative:    CSW provided update to [facility] that patient will likely require Dialysis today. Will continue to follow for needs.   CSW provided preferred clinic options for SNF to Renal Navigator.    Expected Discharge Plan: Skilled Nursing Facility Barriers to Discharge: Continued Medical Work up, English as a second language teacher               Expected Discharge Plan and Services In-house Referral: Clinical Social Work   Post Acute Care Choice: Skilled Nursing Facility Living arrangements for the past 2 months: Single Family Home                                       Social Drivers of Health (SDOH) Interventions SDOH Screenings   Food Insecurity: No Food Insecurity (12/16/2023)  Housing: Low Risk  (12/16/2023)  Recent Concern: Housing - High Risk (11/05/2023)  Transportation Needs: No Transportation Needs (12/16/2023)  Utilities: Not At Risk (12/16/2023)  Alcohol  Screen: Low Risk  (11/05/2023)  Social Connections: Moderately Isolated (12/16/2023)  Tobacco Use: Medium Risk (12/20/2023)    Readmission Risk Interventions     No data to display

## 2023-12-21 NOTE — Progress Notes (Signed)
 PHARMACY - ANTICOAGULATION CONSULT NOTE  Pharmacy Consult for Heparin  drip Indication: atrial fibrillation  Allergies  Allergen Reactions   Ms Contin [Morphine] Hives, Rash and Other (See Comments)    Broke out in brown spots all over.     Patient Measurements: Height: 5' 1 (154.9 cm) Weight: (!) 138.8 kg (306 lb) IBW/kg (Calculated) : 47.8 HEPARIN  DW (KG): 83.5  Vital Signs: Temp: 97.8 F (36.6 C) (09/15 0738) Temp Source: Oral (09/15 0738) BP: 142/55 (09/15 0738) Pulse Rate: 50 (09/15 0055)  Labs: Recent Labs    12/19/23 0227 12/19/23 0431 12/19/23 0952 12/19/23 1226 12/19/23 2055 12/20/23 0704 12/20/23 1502 12/20/23 2345 12/21/23 0452  HGB 7.6*  --   --   --   --  8.3*  --   --  8.4*  HCT 26.0*  --   --   --   --  27.7*  --   --  27.9*  PLT 146*  --   --   --   --  163  --   --  150  APTT 123*   < >  --    < > 49* 70* 73*  --  74*  LABPROT  --   --  19.7*  --   --   --   --  19.1*  --   INR  --   --  1.6*  --   --   --   --  1.5*  --   HEPARINUNFRC >1.10*  --   --   --  >1.10* >1.10*  --   --  >1.10*  CREATININE 3.55*  --   --   --   --  3.77*  --   --  4.07*   < > = values in this interval not displayed.    Estimated Creatinine Clearance: 17.8 mL/min (A) (by C-G formula based on SCr of 4.07 mg/dL (H)).   Medical History: Past Medical History:  Diagnosis Date   A-fib (HCC)    Anemia    Anticoagulated on Coumadin , chronically 09/03/2011   Anxiety    Arthritis    right hip; both knees; left wrist/shoulder; back (01/19/2013   Bleeding on Coumadin  08/2012; 01/18/2013   [facility] admissions (01/19/2013)   CHF (congestive heart failure) (HCC)    2-3 times (01/19/2013)   Chronic lower back pain    Depression    DVT (deep venous thrombosis) (HCC) 10 years ago   numerous/notes 01/18/2013   GERD (gastroesophageal reflux disease)    Gout    Headache(784.0)    maybe weekly (01/19/2013)   Heart murmur    High cholesterol    been off RX for this at one  time (01/18/2013)   History of blood transfusion 1983; 04/2012   3 w/ childbirth; hospitalized for pain (01/19/2013)   Hypertension    Hypothyroidism    Migraines    twice/yr maybe (01/19/2013)   Obstructive sleep apnea 05/03/2012   OSA (obstructive sleep apnea)    sent me for test in 04/2012; never ordered mask, etc (01/19/2013)   PE (pulmonary thromboembolism) (HCC) 3 years ag0   3/notes 01/18/2013   Pneumonia before 2011   once' (01/18/2013)   Renal disorder    kindey function low; Metformin was destroying my kidneys (01/19/2013)   Shortness of breath    only related to my CHF (01/18/2013)   Swelling of hand 08/31/2014   RT HAND   Type II diabetes mellitus (HCC)    UTI (urinary tract infection)  08/31/2014    Medications:  Medications Prior to Admission  Medication Sig Dispense Refill Last Dose/Taking   allopurinol  (ZYLOPRIM ) 100 MG tablet Take 50 mg by mouth every other day.   12/15/2023   amLODipine  (NORVASC ) 5 MG tablet Take 2 tablets (10 mg total) by mouth daily.   12/15/2023   apixaban  (ELIQUIS ) 5 MG TABS tablet Take 5 mg by mouth 2 (two) times daily.   12/15/2023   ARIPiprazole  (ABILIFY ) 2 MG tablet Take 1 tablet (2 mg total) by mouth at bedtime.   12/15/2023   citalopram  (CELEXA ) 40 MG tablet Take 40 mg by mouth at bedtime.   12/15/2023   diazepam  (VALIUM ) 5 MG tablet Take 1 tablet (5 mg total) by mouth 2 (two) times daily as needed for anxiety or muscle spasms. 10 tablet 0 12/15/2023   insulin  glargine (LANTUS  SOLOSTAR) 100 UNIT/ML Solostar Pen Inject 20 Units into the skin daily.   12/15/2023   insulin  lispro (HUMALOG) 100 UNIT/ML injection Inject 5 Units into the skin 3 (three) times daily before meals.   12/15/2023   iron  polysaccharides (NIFEREX) 150 MG capsule Take 1 capsule (150 mg total) by mouth daily.   12/15/2023   levothyroxine  (SYNTHROID ) 100 MCG tablet Take 1 tablet (100 mcg total) by mouth daily before breakfast.   12/15/2023   Melatonin 10 MG TABS Take 10 mg by mouth.    12/15/2023   Multiple Vitamins-Minerals (CENTRUM SILVER) CHEW Chew 2 each by mouth daily.   12/15/2023   rosuvastatin  (CRESTOR ) 10 MG tablet Take 1 tablet (10 mg total) by mouth daily. 30 tablet 0 12/15/2023   sennosides-docusate sodium  (SENOKOT-S) 8.6-50 MG tablet Take 2 tablets by mouth daily.   12/15/2023   insulin  aspart (NOVOLOG ) 100 UNIT/ML injection Inject 5 Units into the skin 3 (three) times daily with meals. (Patient not taking: Reported on 12/17/2023)   Not Taking    Assessment: 68 yo female with h/o PAF on apixaban  PTA admitted with AKI. MD wishes to convert to heparin  drip at this time with plans for liver biopsy. Last dose of apixaban  9/10 at ~1600.   9/15 AM: aPTT therapeutic at 74s with heparin  running at 1100 units/hour. HL remains falsely elevated. CBC remains stable. No signs of bleeding or issues with the heparin  infusion noted.     Goal of Therapy:  Heparin  level 0.3-0.7 units/ml aPTT 66-102 seconds Monitor platelets by anticoagulation protocol: Yes   Plan:  -Continue IV Heparin  1100 units/hr. -Monitor daily heparin  level and aPTT until correlation -Monitor Hgb, PLT and signs of bleeding -F/u switch back to apixaban  after liver biopsy tomorrow, 9/15  Massie Fila, PharmD Clinical Pharmacist  12/21/2023 7:52 AM

## 2023-12-21 NOTE — Plan of Care (Signed)

## 2023-12-21 NOTE — Progress Notes (Signed)
   12/21/23 1733  Vitals  Temp 97.8 F (36.6 C)  Pulse Rate (!) 54  Resp 20  BP (!) 157/49  SpO2 100 %  O2 Device Nasal Cannula  Weight (!) (S)  139.8 kg (Weight Bed)  Type of Weight Post-Dialysis  Oxygen Therapy  O2 Flow Rate (L/min) 2 L/min  Patient Activity (if Appropriate) In bed  Pulse Oximetry Type Continuous  Post Treatment  Dialyzer Clearance Clear  Hemodialysis Intake (mL) 0 mL  Liters Processed 24  Fluid Removed (mL) 1000 mL  Tolerated HD Treatment Yes  Post-Hemodialysis Comments Pt. voice no complaints, pt sleep thru HD treatment. Tx. completed without difficulties and report call to 5W bedside RN. UF goal maintained per.order.   Received patient in bed to unit.  Alert and oriented.  Informed consent signed and in chart.   TX duration: 2  Patient tolerated well.  Transported back to the room  Alert, without acute distress.  Hand-off given to patient's nurse.   Access used: Yes Access issues: No  Total UF removed: 1000 Medication(s) given: See MAR Post HD VS: See Above Grid Post HD weight: 139.8 kg   Zebedee DELENA [facility]

## 2023-12-21 NOTE — Procedures (Signed)
 Interventional Radiology Procedure Note  Procedure: Tunneled HD catheter placement  Complications: None  Estimated Blood Loss: < 10 mL  Findings: 23 cm tip to cuff length Palindrome catheter placed via right internal jugular vein. Tip in RA. OK to use.  Marcey DASEN. Luverne, M.D Pager:  [phone]

## 2023-12-21 NOTE — Progress Notes (Signed)
 PROGRESS NOTE                                                                                                                                                                                                             Patient Demographics:    [name], is a 68 y.o. female, DOB - [birth year], FMW:[id number]  Outpatient Primary MD for the patient is Valma Carwin, MD    LOS - 4  Admit date - 12/15/2023    Chief Complaint  Patient presents with   Chest Pain       Brief Narrative (HPI from H&P)     68 y.o. female long-term resident at SNF with medical history significant of pulmonary hypertension, chronic respiratory failure requiring 2L [place] at baseline, Afib on Eliquis , HFpEF (EF 60-65% in 08/2023), DM2 (A1c 5.7 in 08/2023), DVT/PE x3 s/p IVC filter in 09/2009, CKD stage IV, and morbid obesity p/w SOB/cp iso volume overload 2/2 LLL CAP and HFpEF exacerbation c/b worsening renal failure and found to have liver lesions c/f metastases.    Subjective:   Patient in bed, appears comfortable, denies any headache, no fever, no chest pain or pressure, no shortness of breath , no abdominal pain but remains nauseated, still has constipation last BM per patient around 4 weeks ago. No new focal weakness.   Assessment  & Plan :   Respiratory insufficiency iso volume overload 2/2 LLL CAP and HFpEF exacerbation c/b worsening renal failure   Multifactorial SOB/cp but likely exacerbated by volume overload iso worsening renal failure  has significant volume overload, placed on IV diuretics per nephrology, shortness of breath and chest discomfort improved, continue to monitor with ongoing diuresis.   AKI on CKD (chronic kidney disease) stage 4, GFR 15-29 ml/min baseline creatinine around 2.6 -  Per nephrology, being diuresed but BUN getting extremely high with nausea, some of the nausea could be explained by constipation but BUN is now quite high,  may require HD soon.   UTI and pneumonia on admission.  Empiric antibiotics.  Speech eval.  Supportive care with oxygen and nebulizer treatments, cultures noted finished 5 days of total treatment.  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  Follow-up with PCP for weight loss.  BMI of 57  HFpEF EF 60% on recent echocardiogram recent [facility] noted no significant  CAD, PA pressure 62/16, wedge of 11, PVR 4.4, group 3 pulmonary hypertension suspected, Diuresis per nephrology.   Liver lesions c/f metastases  - IR consulted patient was on Eliquis  biopsy hence biopsy is scheduled for  12/21/23, IR wants a new ultrasound on 12/21/2023 which has been ordered.  Does have very high CA 19.9 and CEA levels as well.   Paroxysmal Afib  -anticoagulation currently heparin  drip post biopsy switch back to Eliquis    Hypothyroid  -PTA Synthroid    HTN  -PTA amlodipine    Constipation with nausea.  Last BM per patient now over 4 weeks ago, on adequate bowel regimen without any results, will add Movantik  and lactulose .  Chronic lymphedema - Venous stasis ulcer of LLE  -Continue wrapping BLE and Unna boots to BLE as well   MDD  -PTA Abilify , Celexa , and Valium   DM2 -PTA long acting insulin  20U daily and SSI TID AC prn   Lab Results  Component Value Date   HGBA1C 5.7 (H) 09/04/2023   .CBG (last 3)  Recent Labs    12/20/23 1536 12/20/23 2127 12/21/23 0736  GLUCAP 182* 155* 107*          Condition - Extremely Guarded  Family Communication  :  none  Code Status :  Full  Consults  :  Renal, IR  PUD Prophylaxis :     Procedures  :     CT Liver - 1. Hypoattenuating liver lesions with peripheral enhancement, largest measuring 7.9 cm in the inferior right hepatic lobe concerning for metastases. Differential consideration includes hepatic abscesses. Hepatic protocol MRI with and without IV contrast is recommended. 2. Small right pleural effusion and RLL pneumonia or  atelectasis.      Disposition Plan  :    Status is: Inpatient   DVT Prophylaxis  :  Hep gtt   Lab Results  Component Value Date   PLT 150 12/21/2023    Diet :  Diet Order             Diet NPO time specified Except for: Sips with Meds  Diet effective midnight                    Inpatient Medications  Scheduled Meds:  acetaminophen   1,000 mg Oral TID   amLODipine   10 mg Oral Daily   ARIPiprazole   2 mg Oral QHS   Chlorhexidine  Gluconate Cloth  6 each Topical Daily   Chlorhexidine  Gluconate Cloth  6 each Topical Q0600   citalopram   20 mg Oral QHS   docusate sodium   200 mg Oral BID   furosemide   80 mg Intravenous BID   insulin  aspart  0-15 Units Subcutaneous TID WC   insulin  glargine  20 Units Subcutaneous Daily   levothyroxine   100 mcg Oral QAC breakfast   melatonin  10 mg Oral QHS   naloxegol  oxalate  25 mg Oral Daily   polyethylene glycol  17 g Oral BID   rosuvastatin   10 mg Oral Daily   sodium zirconium cyclosilicate   10 g Oral Once   Continuous Infusions:  ceFEPime  (MAXIPIME ) IV 2 g (12/20/23 1431)   heparin  1,100 Units/hr (12/21/23 0227)   promethazine  (PHENERGAN ) injection (IM or IVPB) 12.5 mg (12/20/23 2338)   PRN Meds:.diazepam , HYDROmorphone  (DILAUDID ) injection, ondansetron  (ZOFRAN ) IV, mouth rinse, promethazine  (PHENERGAN ) injection (IM or IVPB)    Objective:   Vitals:   12/20/23 2022 12/21/23 0055 12/21/23 0503 12/21/23 0738  BP: (!) 141/47 (!) 136/47 136/60 (!) 142/55  Pulse: (!) 50 (!) 50    Resp: 20 20 18    Temp: 97.8 F (36.6 C) 97.7 F (36.5 C) 97.6 F (36.4 C) 97.8 F (36.6 C)  TempSrc: Oral Oral Oral Oral  SpO2: 99% 100% 99%   Weight:      Height:        Wt Readings from Last 3 Encounters:  12/15/23 (!) 138.8 kg  12/05/23 (!) 137.2 kg  11/05/23 136.1 kg     Intake/Output Summary (Last 24 hours) at 12/21/2023 0831 Last data filed at 12/21/2023 0400 Gross per 24 hour  Intake 648.6 ml  Output 500 ml  Net 148.6 ml      Physical Exam  Awake Alert, No new F.N deficits, Normal affect [place].AT,PERRAL Supple Neck, No JVD,   Symmetrical Chest wall movement, Good air movement bilaterally, CTAB RRR,No Gallops,Rubs or new Murmurs,  +ve B.Sounds, Abd Soft, No tenderness,   2+ edema, both lower extremities under bandage    Data Review:    Recent Labs  Lab 12/15/23 2054 12/18/23 0248 12/19/23 0227 12/20/23 0704 12/21/23 0452  WBC 8.1 6.0 5.6 6.8 8.5  HGB 8.7* 7.7* 7.6* 8.3* 8.4*  HCT 29.8* 25.6* 26.0* 27.7* 27.9*  PLT 154 133* 146* 163 150  MCV 104.6* 102.0* 102.8* 101.8* 102.2*  MCH 30.5 30.7 30.0 30.5 30.8  MCHC 29.2* 30.1 29.2* 30.0 30.1  RDW 16.6* 16.7* 16.9* 16.9* 16.9*  LYMPHSABS 0.4* 0.5* 0.4* 0.5* 0.5*  MONOABS 0.6 0.6 0.7 0.8 0.8  EOSABS 0.1 0.2 0.2 0.1 0.2  BASOSABS 0.0 0.0 0.0 0.0 0.0    Recent Labs  Lab 12/15/23 2054 12/16/23 0013 12/17/23 0209 12/18/23 0248 12/19/23 0227 12/19/23 0952 12/20/23 0704 12/20/23 2345 12/21/23 0452  NA 136  --  137 135 135  --  135  --  137  K 4.6  --  4.9 4.8 4.5  --  4.9  --  5.1  CL 98  --  100 100 100  --  100  --  100  CO2 24  --  25 23 24   --  22  --  21*  ANIONGAP 14  --  12 12 11   --  13  --  16*  GLUCOSE 225*  --  125* 125* 95  --  118*  --  96  BUN 135*  --  135* 138* 140*  --  143*  --  147*  CREATININE 3.65*  --  3.38* 3.46* 3.55*  --  3.77*  --  4.07*  AST 23  --   --  17 18  --  26  --  27  ALT 16  --   --  14 14  --  17  --  18  ALKPHOS 79  --   --  63 60  --  69  --  71  BILITOT 0.5  --   --  0.6 0.5  --  0.6  --  0.6  ALBUMIN  3.1*  --  2.7* 2.6* 2.9*  --  3.1*  --  3.0*  LATICACIDVEN  --  1.0  --   --   --   --   --   --   --   INR  --   --  2.1*  --   --  1.6*  --  1.5*  --   BNP 583.7*  --   --   --   --   --   --   --   --  MG  --   --   --  2.7* 2.8*  --  2.8*  --  2.9*  PHOS  --   --  5.5*  --   --   --   --   --   --   CALCIUM  8.6*  --  8.4* 8.4* 8.4*  --  8.6*  --  8.6*      Recent Labs  Lab 12/15/23 2054  12/16/23 0013 12/17/23 0209 12/18/23 0248 12/19/23 0227 12/19/23 0952 12/20/23 0704 12/20/23 2345 12/21/23 0452  LATICACIDVEN  --  1.0  --   --   --   --   --   --   --   INR  --   --  2.1*  --   --  1.6*  --  1.5*  --   BNP 583.7*  --   --   --   --   --   --   --   --   MG  --   --   --  2.7* 2.8*  --  2.8*  --  2.9*  CALCIUM  8.6*  --  8.4* 8.4* 8.4*  --  8.6*  --  8.6*    --------------------------------------------------------------------------------------------------------------- Lab Results  Component Value Date   CHOL 105 09/03/2023   HDL 50 09/03/2023   LDLCALC 44 09/03/2023   TRIG 56 09/03/2023   CHOLHDL 2.1 09/03/2023    Lab Results  Component Value Date   HGBA1C 5.7 (H) 09/04/2023   No results for input(s): TSH, T4TOTAL, FREET4, T3FREE, THYROIDAB in the last 72 hours. No results for input(s): VITAMINB12, FOLATE, FERRITIN, TIBC, IRON , RETICCTPCT in the last 72 hours.    Micro Results Recent Results (from the past 240 hours)  Urine Culture     Status: Abnormal   Collection Time: 12/16/23 12:13 AM   Specimen: Urine, Clean Catch  Result Value Ref Range Status   Specimen Description URINE, CLEAN CATCH  Final   Special Requests   Final    NONE Performed at [facility], [address] [place], [place] [address]    Culture 80,000 COLONIES/mL CITROBACTER FREUNDII (A)  Final   Report Status 12/19/2023 FINAL  Final   Organism ID, Bacteria CITROBACTER FREUNDII (A)  Final      Susceptibility   Citrobacter freundii - MIC*    CEFEPIME  <=0.12 SENSITIVE Sensitive     ERTAPENEM <=0.12 SENSITIVE Sensitive     CEFTRIAXONE  32 RESISTANT Resistant     CIPROFLOXACIN  <=0.06 SENSITIVE Sensitive     GENTAMICIN  <=1 SENSITIVE Sensitive     NITROFURANTOIN <=16 SENSITIVE Sensitive     TRIMETH /SULFA  >=320 RESISTANT Resistant     PIP/TAZO Value in next row Sensitive ug/mL     <=4 SENSITIVEThis is a modified FDA-approved test that has been validated  and its performance characteristics determined by the reporting laboratory.  This laboratory is certified under the Clinical Laboratory Improvement Amendments CLIA as qualified to perform high complexity clinical laboratory testing.    MEROPENEM Value in next row Sensitive      <=4 SENSITIVEThis is a modified FDA-approved test that has been validated and its performance characteristics determined by the reporting laboratory.  This laboratory is certified under the Clinical Laboratory Improvement Amendments CLIA as qualified to perform high complexity clinical laboratory testing.    * 80,000 COLONIES/mL CITROBACTER FREUNDII  Blood culture (routine x 2)     Status: None (Preliminary result)   Collection Time: 12/16/23 12:13 AM  Specimen: BLOOD  Result Value Ref Range Status   Specimen Description BLOOD SITE NOT SPECIFIED  Final   Special Requests   Final    BOTTLES DRAWN AEROBIC AND ANAEROBIC Blood Culture results may not be optimal due to an inadequate volume of blood received in culture bottles   Culture   Final    NO GROWTH 4 DAYS Performed at [facility], [address] [place], [place] [address]    Report Status PENDING  Incomplete  Blood culture (routine x 2)     Status: None (Preliminary result)   Collection Time: 12/16/23 12:13 AM   Specimen: BLOOD  Result Value Ref Range Status   Specimen Description BLOOD SITE NOT SPECIFIED  Final   Special Requests   Final    BOTTLES DRAWN AEROBIC AND ANAEROBIC Blood Culture adequate volume   Culture   Final    NO GROWTH 4 DAYS Performed at [facility], [address], [place], [place] [address]    Report Status PENDING  Incomplete    Radiology Report DG Abd 1 View Result Date: 12/20/2023 CLINICAL DATA:  68 year old female with constipation. EXAM: ABDOMEN - 1 VIEW COMPARISON:  CT Abdomen and Pelvis 12/16/2023 and earlier. FINDINGS: AP supine views at 0759 hours. Four views. Non obstructed bowel gas pattern. IVC filter  redemonstrated. Stable cholecystectomy clips. Moderate volume retained rectosigmoid colon stool. Mild large bowel retained stool elsewhere. Stable visualized osseous structures. Advanced lower lumbar disc and endplate degeneration. Grossly negative lung bases, cardiac pacemaker lead redemonstrated. IMPRESSION: Non obstructed bowel gas pattern with moderate volume retained stool, primarily in the rectosigmoid. Electronically Signed   By: VEAR Hurst M.D.   On: 12/20/2023 08:19      Signature  -   Lavada Stank M.D on 12/21/2023 at 8:31 AM   -  To page go to [email or web address]

## 2023-12-21 NOTE — Progress Notes (Signed)
 Requested to see pt for out-pt HD needs at d/c. Chart reviewed and noted pt from snf and will likely return there at d/c. Contacted CSW to inquire if snf will require a specific clinic and/or schedule. Will assist with out-pt HD arrangements.   Randine Mungo Dialysis Navigator [phone]

## 2023-12-21 NOTE — Progress Notes (Addendum)
 [facility] NEPHROLOGY PROGRESS NOTE  Assessment/ Plan: Pt is a 68 y.o. 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   # Acute kidney injury on CKD stage IV likely due to reduced effective renal perfusion caused by CHF/infection/recent prolonged hospitalization versus progressive CKD.   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 Dennise at [facility], last seen in 06/2023.  Since then patient had hospitalization at [facility] related with abscess, CHF, AKI on CKD.  Now presents with fluid overload in the setting of withholding diuretics and marginal UOP with IV diuretics.  Unfortunately showing signs of uremia including nausea and generalized weakness.   ------------ - She is to start HD today after tunneled catheter placement with IR  - NPO for catheter  - Will plan for HD on 9/16 as well   - Follow her trends and assess for renal recovery.  Note multiple prior admissions with overload - she may have had CKD progression  - will    # Chronic hypoxic respiratory failure due to pneumonia, CHF: Currently on antibiotics and diuretics.   # CHF exacerbation with preserved EF: Recent echo reviewed with EF 60 to 65%.  Cath with nonobstructive CAD. - optimize volume status with HD    # Liver lesions consistent with metastasis:  - IR consulted for biopsy/drainage - per primary team   # Anemia: Iron  saturation 9% with low serum iron , she has no bacteremia.   - s/p IV iron  - we will hold ESA while undergoing evaluation for liver mass.  Disposition - continue inpatient monitoring     Subjective:  She had 400 mL UOP over 9/14 charted. She has  just gotten her tunneled catheter with IR and hasn't had HD yet.  Answered several basic questions about dialysis   Review of systems:  Denies shortness of breath  Denies n/v Denies chest pain    Objective Vital signs in last 24 hours: Vitals:   12/21/23 1345 12/21/23 1350 12/21/23 1405 12/21/23 1410  BP: (!) 148/54 (!) 144/47 (!) 144/54 (!) 139/48  Pulse: (!) 50 (!) 50 (!) 50 (!) 50  Resp: 18 20 14 19   Temp:      TempSrc:      SpO2: 98% 97% 98% 97%  Weight:      Height:       Weight change:   Intake/Output Summary (Last 24 hours) at 12/21/2023 1415 Last data filed at 12/21/2023 0400 Gross per 24 hour  Intake 648.6 ml  Output 400 ml  Net 248.6 ml       Labs: RENAL PANEL Recent Labs  Lab 12/17/23 0209 12/18/23 0248 12/19/23 0227 12/20/23 0704 12/21/23 0452  NA 137 135 135 135 137  K 4.9 4.8 4.5 4.9 5.1  CL 100 100 100 100 100  CO2 25 23 24 22  21*  GLUCOSE 125* 125* 95 118* 96  BUN 135* 138* 140* 143* 147*  CREATININE 3.38* 3.46* 3.55* 3.77* 4.07*  CALCIUM  8.4* 8.4* 8.4* 8.6* 8.6*  MG  --  2.7* 2.8* 2.8* 2.9*  PHOS 5.5*  --   --   --   --   ALBUMIN  2.7* 2.6* 2.9*  3.1* 3.0*    Liver Function Tests: Recent Labs  Lab 12/19/23 0227 12/20/23 0704 12/21/23 0452  AST 18 26 27   ALT 14 17 18   ALKPHOS 60 69 71  BILITOT 0.5 0.6 0.6  PROT 5.3* 4.7* 5.4*  ALBUMIN  2.9* 3.1* 3.0*   No results for input(s): LIPASE, AMYLASE in the last 168 hours. No results for input(s): AMMONIA in the last 168 hours. CBC: Recent Labs    11/17/23 1113 11/18/23 1050 12/15/23 2054 12/17/23 0209 12/18/23 0248 12/19/23 0227 12/20/23 0704 12/21/23 0452  HGB  --    < > 8.7*  --  7.7* 7.6* 8.3* 8.4*  MCV  --    < > 104.6*  --  102.0* 102.8* 101.8* 102.2*  VITAMINB12 480  --   --   --   --   --   --   --   FERRITIN  --   --   --  115  --   --   --   --   TIBC 245*  --   --  245*  --   --   --   --   IRON  33  --   --  22*  --   --   --   --    < > = values in this  interval not displayed.    Cardiac Enzymes: No results for input(s): CKTOTAL, CKMB, CKMBINDEX, TROPONINI in the last 168 hours. CBG: Recent Labs  Lab 12/20/23 0725 12/20/23 1155 12/20/23 1536 12/20/23 2127 12/21/23 0736  GLUCAP 117* 145* 182* 155* 107*    Iron  Studies:  No results for input(s): IRON , TIBC, TRANSFERRIN, FERRITIN in the last 72 hours.  Studies/Results: DG Abd 1 View Result Date: 12/20/2023 CLINICAL DATA:  68 year old female with constipation. EXAM: ABDOMEN - 1 VIEW COMPARISON:  CT Abdomen and Pelvis 12/16/2023 and earlier. FINDINGS: AP supine views at 0759 hours. Four views. Non obstructed bowel gas pattern. IVC filter redemonstrated. Stable cholecystectomy clips. Moderate volume retained rectosigmoid colon stool. Mild large bowel retained stool elsewhere. Stable visualized osseous structures. Advanced lower lumbar disc and endplate degeneration. Grossly negative lung bases, cardiac pacemaker lead redemonstrated. IMPRESSION: Non obstructed bowel gas pattern with moderate volume retained stool, primarily in the rectosigmoid. Electronically Signed   By: VEAR Hurst M.D.   On: 12/20/2023 08:19     Medications: Infusions:  ceFEPime  (MAXIPIME ) IV 2 g (12/20/23 1431)   heparin  1,100 Units/hr (12/21/23 0227)   promethazine  (PHENERGAN ) injection (IM or IVPB) 12.5 mg (12/20/23 2338)    Scheduled Medications:  acetaminophen   1,000 mg Oral TID   amLODipine   10 mg Oral Daily   ARIPiprazole   2 mg Oral QHS   Chlorhexidine  Gluconate Cloth  6 each Topical Daily   Chlorhexidine  Gluconate Cloth  6 each Topical Q0600   citalopram   20 mg Oral QHS   docusate sodium   200 mg Oral BID   furosemide   80 mg Intravenous BID   heparin  sodium (porcine)  1,000 Units Intravenous Once   insulin  aspart  0-15 Units Subcutaneous TID WC   insulin  glargine  20 Units Subcutaneous Daily   levothyroxine   100 mcg Oral QAC breakfast   melatonin  10 mg Oral QHS   naloxegol  oxalate  25 mg  Oral Daily   polyethylene glycol  17 g Oral BID   rosuvastatin   10 mg Oral Daily    have reviewed scheduled and prn medications.  Physical Exam:   General adult female in bed in no  acute distress HEENT normocephalic atraumatic extraocular movements intact sclera anicteric Neck supple trachea midline Lungs clear but reduced to auscultation bilaterally normal work of breathing at rest on nasal cannula  Heart S1S2 no rub Abdomen soft nontender nondistended Extremities diffuse edema; legs wrapped and weeping  Psych normal mood and affect Neuro alert and oriented x 3 provides  Access RIJ tunn catheter in use   Katheryn JAYSON Saba 12/21/2023,3:05 PM  LOS: 4 days      Spoke with HD SW to initiate CLIP as an AKI.  Given her history, unclear whether or not she will be able to successfully come off of HD   Katheryn JAYSON Saba, MD 3:28 PM 12/21/2023

## 2023-12-22 ENCOUNTER — Inpatient Hospital Stay (HOSPITAL_COMMUNITY)

## 2023-12-22 DIAGNOSIS — J189 Pneumonia, unspecified organism: Secondary | ICD-10-CM | POA: Diagnosis not present

## 2023-12-22 LAB — CBC WITH DIFFERENTIAL/PLATELET
Abs Immature Granulocytes: 0.03 K/uL (ref 0.00–0.07)
Basophils Absolute: 0 K/uL (ref 0.0–0.1)
Basophils Relative: 0 %
Eosinophils Absolute: 0.1 K/uL (ref 0.0–0.5)
Eosinophils Relative: 2 %
HCT: 27.8 % — ABNORMAL LOW (ref 36.0–46.0)
Hemoglobin: 8.2 g/dL — ABNORMAL LOW (ref 12.0–15.0)
Immature Granulocytes: 0 %
Lymphocytes Relative: 4 %
Lymphs Abs: 0.3 K/uL — ABNORMAL LOW (ref 0.7–4.0)
MCH: 30.1 pg (ref 26.0–34.0)
MCHC: 29.5 g/dL — ABNORMAL LOW (ref 30.0–36.0)
MCV: 102.2 fL — ABNORMAL HIGH (ref 80.0–100.0)
Monocytes Absolute: 0.6 K/uL (ref 0.1–1.0)
Monocytes Relative: 8 %
Neutro Abs: 6.6 K/uL (ref 1.7–7.7)
Neutrophils Relative %: 86 %
Platelets: 129 K/uL — ABNORMAL LOW (ref 150–400)
RBC: 2.72 MIL/uL — ABNORMAL LOW (ref 3.87–5.11)
RDW: 17 % — ABNORMAL HIGH (ref 11.5–15.5)
WBC: 7.7 K/uL (ref 4.0–10.5)
nRBC: 0 % (ref 0.0–0.2)

## 2023-12-22 LAB — COMPREHENSIVE METABOLIC PANEL WITH GFR
ALT: 14 U/L (ref 0–44)
AST: 22 U/L (ref 15–41)
Albumin: 2.7 g/dL — ABNORMAL LOW (ref 3.5–5.0)
Alkaline Phosphatase: 70 U/L (ref 38–126)
Anion gap: 12 (ref 5–15)
BUN: 121 mg/dL — ABNORMAL HIGH (ref 8–23)
CO2: 23 mmol/L (ref 22–32)
Calcium: 8.3 mg/dL — ABNORMAL LOW (ref 8.9–10.3)
Chloride: 100 mmol/L (ref 98–111)
Creatinine, Ser: 3.77 mg/dL — ABNORMAL HIGH (ref 0.44–1.00)
GFR, Estimated: 13 mL/min — ABNORMAL LOW (ref >60)
Glucose, Bld: 99 mg/dL (ref 70–99)
Potassium: 4.5 mmol/L (ref 3.5–5.1)
Sodium: 135 mmol/L (ref 135–145)
Total Bilirubin: 0.4 mg/dL (ref 0.0–1.2)
Total Protein: 5.1 g/dL — ABNORMAL LOW (ref 6.5–8.1)

## 2023-12-22 LAB — GLUCOSE, CAPILLARY
Glucose-Capillary: 111 mg/dL — ABNORMAL HIGH (ref 70–99)
Glucose-Capillary: 114 mg/dL — ABNORMAL HIGH (ref 70–99)
Glucose-Capillary: 97 mg/dL (ref 70–99)
Glucose-Capillary: 143 mg/dL — ABNORMAL HIGH (ref 70–99)

## 2023-12-22 LAB — HEPARIN LEVEL (UNFRACTIONATED): Heparin Unfractionated: >1.1 [IU]/mL — ABNORMAL HIGH (ref 0.30–0.70)

## 2023-12-22 LAB — APTT: aPTT: 70 s — ABNORMAL HIGH (ref 24–36)

## 2023-12-22 LAB — HEPATITIS B SURFACE ANTIBODY, QUANTITATIVE: Hep B S AB Quant (Post): <3.5 m[IU]/mL — ABNORMAL LOW

## 2023-12-22 MED ORDER — HEPARIN (PORCINE) 25000 UT/250ML-% IV SOLN
1300.0000 [IU]/h | INTRAVENOUS | Status: AC
  Administered 2023-12-22: 1100 [IU]/h via INTRAVENOUS
  Administered 2023-12-23: 1200 [IU]/h via INTRAVENOUS
  Filled 2023-12-22: qty 250

## 2023-12-22 MED ORDER — LIDOCAINE-PRILOCAINE 2.5-2.5 % EX CREA: 1.0000 | TOPICAL_CREAM | CUTANEOUS | Status: DC | PRN

## 2023-12-22 MED ORDER — FENTANYL CITRATE (PF) 100 MCG/2ML IJ SOLN
INTRAMUSCULAR | Status: AC
  Filled 2023-12-22: qty 2

## 2023-12-22 MED ORDER — ANTICOAGULANT SODIUM CITRATE 4% (200MG/5ML) IV SOLN: 5.0000 mL | Status: DC | PRN

## 2023-12-22 MED ORDER — HEPARIN SODIUM (PORCINE) 1000 UNIT/ML DIALYSIS: 1000.0000 [IU] | INTRAMUSCULAR | Status: DC | PRN

## 2023-12-22 MED ORDER — FENTANYL CITRATE (PF) 100 MCG/2ML IJ SOLN
INTRAMUSCULAR | Status: AC | PRN
  Administered 2023-12-22: 25 ug via INTRAVENOUS
  Administered 2023-12-22: 50 ug via INTRAVENOUS
  Administered 2023-12-22: 25 ug via INTRAVENOUS

## 2023-12-22 MED ORDER — LIDOCAINE HCL (PF) 1 % IJ SOLN: 5.0000 mL | INTRAMUSCULAR | Status: DC | PRN

## 2023-12-22 MED ORDER — LACTULOSE 10 GM/15ML PO SOLN
30.0000 g | Freq: Three times a day (TID) | ORAL | Status: AC
  Administered 2023-12-22: 30 g via ORAL
  Filled 2023-12-22: qty 60

## 2023-12-22 MED ORDER — PENTAFLUOROPROP-TETRAFLUOROETH EX AERO: 1.0000 | INHALATION_SPRAY | CUTANEOUS | Status: DC | PRN

## 2023-12-22 MED ORDER — BISACODYL 5 MG PO TBEC
10.0000 mg | DELAYED_RELEASE_TABLET | Freq: Every day | ORAL | Status: AC
  Filled 2023-12-22 (×2): qty 2

## 2023-12-22 MED ORDER — MIDAZOLAM HCL 2 MG/2ML IJ SOLN
INTRAMUSCULAR | Status: AC | PRN
  Administered 2023-12-22: 2 mg via INTRAVENOUS

## 2023-12-22 MED ORDER — GELATIN ABSORBABLE 12-7 MM EX MISC
1.0000 | Freq: Once | CUTANEOUS | Status: AC
Start: 2023-12-22 — End: 2023-12-22
  Administered 2023-12-22: 1 via TOPICAL
  Filled 2023-12-22: qty 1

## 2023-12-22 MED ORDER — LIDOCAINE HCL (PF) 1 % IJ SOLN
10.0000 mL | Freq: Once | INTRAMUSCULAR | Status: AC
  Administered 2023-12-22: 10 mL

## 2023-12-22 MED ORDER — MIDAZOLAM HCL 2 MG/2ML IJ SOLN
INTRAMUSCULAR | Status: AC
  Filled 2023-12-22: qty 2

## 2023-12-22 NOTE — Procedures (Signed)
 Interventional Radiology Procedure Note  Procedure: US  RT LIVER LESION CORE BX    Complications: None  Estimated Blood Loss:  MIN  Findings: 18 G CORE X 2 IN FORMALIN    EMERSON FREDERIC SPECKING, MD

## 2023-12-22 NOTE — Progress Notes (Signed)
 [name]  Assessment/ Plan: Pt is a 68 y.o. 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   # Acute kidney injury on CKD stage IV likely due to reduced effective renal perfusion caused by CHF/infection/recent prolonged hospitalization versus progressive CKD.   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 Dennise at [facility], last seen in 06/2023.  Since then patient had hospitalization at [facility] related with abscess, CHF, AKI on CKD.  Now presents with fluid overload in the setting of withholding diuretics and marginal UOP with IV diuretics.  Unfortunately showing signs of uremia including nausea and generalized weakness.  Started HD on 9/15 after tunneled catheter with IR ------------ - HD today and anticipate another tx on 9/18 - Assess dialysis needs daily  - Follow her trends and assess for renal recovery.  [name] multiple prior admissions with overload - she may have had CKD progression  - Spoke with HD SW to initiate CLIP as an AKI.  Given her history, unclear whether or not she will be able to successfully come off of HD    # Chronic hypoxic respiratory failure due to pneumonia, CHF:  -Currently on antibiotics and diuretics. - optimizing volume status with HD   # CHF exacerbation with preserved EF: Recent echo reviewed with EF 60 to 65%.  Cath with nonobstructive CAD. - optimize volume status with HD    # Liver lesions consistent with metastasis:  - IR consulted for biopsy which occurred on 9/16 - per primary team   # Anemia: Iron  saturation 9% with low serum iron , she has no bacteremia.   - s/p IV  iron  - we will hold ESA while undergoing evaluation for liver mass.  # CKD stage IV -CKD thought to be due to diabetic nephropathy and was followed by Dr. Dennise at [facility], last seen in 06/2023. Then multiple readmissions in the interim as above  Disposition - continue inpatient monitoring     Subjective:  She had no UOP documented over 9/15.  Her first HD treatment was on 9/15 with 1 kg UF after tunneled catheter placement with IR.  She got a liver biopsy earlier today with IR.  Feels ok - states just a little groggy after the procedure.  She is prepared that she might not be able to come off of dialysis.  She states that she wears oxygen at home (2 liters).   Review of systems:    States that her shortness of breath is no more than usual; she's been short of breath when she tries to move around and exert herself Denies n/v now - states nausea earlier and got a PRN Denies chest pain    Objective Vital signs in last 24 hours: Vitals:   12/22/23 1230 12/22/23 1235 12/22/23 1240 12/22/23 1245  BP: (!) 148/46 (!) 142/45 (!) 140/50 (!) 136/54  Pulse: (!) 50 (!) 49 (!) 50 (!) 50  Resp: 20 11 12 11   Temp:      TempSrc:      SpO2: 97% 98% 97% 97%  Weight:      Height:       Weight change:  Intake/Output Summary (Last 24 hours) at 12/22/2023 1320 Last data filed at 12/22/2023 0351 Gross per 24 hour  Intake 436.15 ml  Output 1000 ml  Net -563.85 ml       Labs: RENAL PANEL Recent Labs  Lab 12/17/23 0209 12/18/23 0248 12/19/23 0227 12/20/23 0704 12/21/23 0452 12/22/23 0324  NA 137 135 135 135 137 135  K 4.9 4.8 4.5 4.9 5.1 4.5  CL 100 100 100 100 100 100  CO2 25 23 24 22  21* 23  GLUCOSE 125* 125* 95 118* 96 99  BUN 135* 138* 140* 143* 147* 121*  CREATININE 3.38* 3.46* 3.55* 3.77* 4.07* 3.77*  CALCIUM  8.4* 8.4* 8.4* 8.6* 8.6* 8.3*  MG  --  2.7* 2.8* 2.8* 2.9*  --   PHOS 5.5*  --   --   --   --   --   ALBUMIN  2.7* 2.6* 2.9* 3.1* 3.0* 2.7*    Liver Function  Tests: Recent Labs  Lab 12/20/23 0704 12/21/23 0452 12/22/23 0324  AST 26 27 22   ALT 17 18 14   ALKPHOS 69 71 70  BILITOT 0.6 0.6 0.4  PROT 4.7* 5.4* 5.1*  ALBUMIN  3.1* 3.0* 2.7*   No results for input(s): LIPASE, AMYLASE in the last 168 hours. No results for input(s): AMMONIA in the last 168 hours. CBC: Recent Labs    11/17/23 1113 11/18/23 1050 12/17/23 0209 12/18/23 0248 12/19/23 0227 12/20/23 0704 12/21/23 0452 12/22/23 0324  HGB  --    < >  --  7.7* 7.6* 8.3* 8.4* 8.2*  MCV  --    < >  --  102.0* 102.8* 101.8* 102.2* 102.2*  VITAMINB12 480  --   --   --   --   --   --   --   FERRITIN  --   --  115  --   --   --   --   --   TIBC 245*  --  245*  --   --   --   --   --   IRON  33  --  22*  --   --   --   --   --    < > = values in this interval not displayed.    Cardiac Enzymes: No results for input(s): CKTOTAL, CKMB, CKMBINDEX, TROPONINI in the last 168 hours. CBG: Recent Labs  Lab 12/21/23 2052 12/21/23 2112 12/21/23 2203 12/22/23 0821 12/22/23 1141  GLUCAP 121* 120* 265* 111* 114*    Iron  Studies:  No results for input(s): IRON , TIBC, TRANSFERRIN, FERRITIN in the last 72 hours.  Studies/Results: US  Abdomen Limited RUQ (LIVER/GB) Result Date: 12/21/2023 CLINICAL DATA:  Transaminitis EXAM: ULTRASOUND ABDOMEN LIMITED RIGHT UPPER QUADRANT COMPARISON:  CT scan 12/16/2023 FINDINGS: Gallbladder: Surgically absent Common bile duct: Diameter: 0.6 cm Liver: Abnormal nodular contour of the liver with heterogeneous liver echogenicity and poor sonic penetration. The hypoenhancing masses/lesions throughout the liver shown on 12/16/2023 not well seen, possibly due to lesser conspicuity on ultrasound compared to CT. Portal vein is patent on color Doppler imaging with normal direction of blood flow towards the liver. Other: Small right pleural effusion. IMPRESSION: 1. Abnormal nodular contour of the liver with heterogeneous liver echogenicity and poor sonic  penetration. The hypoenhancing masses/lesions throughout the liver shown on 12/16/2023 not well seen, possibly due to lesser conspicuity on ultrasound compared to CT. 2. Small right pleural effusion. 3. Prior cholecystectomy. Electronically Signed   By: Ryan Salvage M.D.   On: 12/21/2023  20:51   IR TUNNELED CENTRAL VENOUS CATH [facility] W IMG Result Date: 12/21/2023 CLINICAL DATA:  Renal failure and need for tunneled hemodialysis catheter. EXAM: TUNNELED CENTRAL VENOUS HEMODIALYSIS CATHETER PLACEMENT WITH ULTRASOUND AND FLUOROSCOPIC GUIDANCE ANESTHESIA/SEDATION: Moderate (conscious) sedation was employed during this procedure. A total of Versed  1.0 mg and Fentanyl  50 mcg was administered intravenously. Moderate Sedation Time: 28 minutes. The patient's level of consciousness and vital signs were monitored continuously by radiology nursing throughout the procedure under my direct supervision. MEDICATIONS: 1 g IV Ancef . FLUOROSCOPY: Radiation Exposure Index: 18.1 mGy Kerma PROCEDURE: The procedure, risks, benefits, and alternatives were explained to the patient. Questions regarding the procedure were encouraged and answered. The patient understands and consents to the procedure. A timeout was performed prior to initiating the procedure. The right neck and chest were prepped with chlorhexidine  in a sterile fashion, and a sterile drape was applied covering the operative field. Maximum barrier sterile technique with sterile gowns and gloves were used for the procedure. Local anesthesia was provided with 1% lidocaine . Ultrasound was performed to confirm patency of the right internal jugular vein. An ultrasound image was saved and recorded. After creating a small venotomy incision, a 21 gauge needle was advanced into the right internal jugular vein under direct, real-time ultrasound guidance. After securing guidewire access, an 8 Fr dilator was placed. A J-wire was kinked to measure appropriate catheter length. A  Palindrome tunneled hemodialysis catheter measuring 23 cm from tip to cuff was chosen for placement. This was tunneled in a retrograde fashion from the chest wall to the venotomy incision. At the venotomy, serial dilatation was performed and a 15 Fr peel-away sheath was placed over a guidewire. The catheter was then placed through the sheath and the sheath removed. Final catheter positioning was confirmed and documented with a fluoroscopic spot image. The catheter was aspirated, flushed with saline, and injected with appropriate volume heparin  dwells. The venotomy incision was closed with subcutaneous and subcuticular 4-0 Vicryl. Dermabond was applied to the incision. The catheter exit site was secured with 0-Prolene retention sutures. COMPLICATIONS: None.  No pneumothorax. FINDINGS: After catheter placement, the tip lies in the right atrium. The catheter aspirates normally and is ready for immediate use. IMPRESSION: Placement of tunneled hemodialysis catheter via the right internal jugular vein. The catheter tip lies in the right atrium. The catheter is ready for immediate use. Electronically Signed   By: Marcey Moan M.D.   On: 12/21/2023 16:24     Medications: Infusions:  heparin      promethazine  (PHENERGAN ) injection (IM or IVPB) Stopped (12/20/23 2358)    Scheduled Medications:  acetaminophen   1,000 mg Oral TID   amLODipine   10 mg Oral Daily   ARIPiprazole   2 mg Oral QHS   bisacodyl   10 mg Oral Q0600   Chlorhexidine  Gluconate Cloth  6 each Topical Q0600   Chlorhexidine  Gluconate Cloth  6 each Topical Q0600   citalopram   20 mg Oral QHS   docusate sodium   200 mg Oral BID   furosemide   80 mg Intravenous BID   insulin  aspart  0-15 Units Subcutaneous TID WC   insulin  glargine  20 Units Subcutaneous Daily   lactulose   30 g Oral TID   levothyroxine   100 mcg Oral QAC breakfast   melatonin  10 mg Oral QHS   naloxegol  oxalate  25 mg Oral Daily   polyethylene glycol  17 g Oral BID    rosuvastatin   10 mg Oral Daily    have reviewed scheduled  and prn medications.  Physical Exam:     General adult female in bed in no acute distress HEENT normocephalic atraumatic extraocular movements intact sclera anicteric Neck supple trachea midline Lungs clear but reduced to auscultation bilaterally normal work of breathing at rest on nasal cannula 2 liters Heart S1S2 no rub Abdomen soft nontender obese habitus Extremities diffuse edema; legs wrapped and weeping  Psych normal mood and affect Neuro alert and oriented x 3 provides  Access RIJ tunn catheter    Katheryn JAYSON Saba 12/22/2023,1:35 PM  LOS: 5 days

## 2023-12-22 NOTE — Procedures (Signed)
 Seen and examined on dialysis.  Blood pressure 136/54 and HR 60.  Tolerating goal.  Tunn Catheter in use.  Procedure supervised  Katheryn JAYSON Saba, MD 12/22/2023  3:02 PM

## 2023-12-22 NOTE — Progress Notes (Signed)
 PHARMACY - ANTICOAGULATION CONSULT NOTE  Pharmacy Consult for Heparin  drip Indication: atrial fibrillation  Allergies  Allergen Reactions   Ms Contin [Morphine] Hives, Rash and Other (See Comments)    Broke out in brown spots all over.     Patient Measurements: Height: 5' 1 (154.9 cm) Weight: (S) (!) 139.8 kg (308 lb 3.3 oz) (Weight Bed) IBW/kg (Calculated) : 47.8 HEPARIN  DW (KG): 83.5  Vital Signs: Temp: 98.2 F (36.8 C) (09/16 0800) Temp Source: Oral (09/16 0800) BP: 136/54 (09/16 1245) Pulse Rate: 50 (09/16 1245)  Labs: Recent Labs    12/20/23 0704 12/20/23 1502 12/20/23 2345 12/21/23 0452 12/22/23 0324  HGB 8.3*  --   --  8.4* 8.2*  HCT 27.7*  --   --  27.9* 27.8*  PLT 163  --   --  150 129*  APTT 70* 73*  --  74* 70*  LABPROT  --   --  19.1*  --   --   INR  --   --  1.5*  --   --   HEPARINUNFRC >1.10*  --   --  >1.10* >1.10*  CREATININE 3.77*  --   --  4.07* 3.77*    Estimated Creatinine Clearance: 19.3 mL/min (A) (by C-G formula based on SCr of 3.77 mg/dL (H)).   Medical History: Past Medical History:  Diagnosis Date   A-fib (HCC)    Anemia    Anticoagulated on Coumadin , chronically 09/03/2011   Anxiety    Arthritis    right hip; both knees; left wrist/shoulder; back (01/19/2013   Bleeding on Coumadin  08/2012; 01/18/2013   [facility] admissions (01/19/2013)   CHF (congestive heart failure) (HCC)    2-3 times (01/19/2013)   Chronic lower back pain    Depression    DVT (deep venous thrombosis) (HCC) 10 years ago   numerous/notes 01/18/2013   GERD (gastroesophageal reflux disease)    Gout    Headache(784.0)    maybe weekly (01/19/2013)   Heart murmur    High cholesterol    been off RX for this at one time (01/18/2013)   History of blood transfusion 1983; 04/2012   3 w/ childbirth; hospitalized for pain (01/19/2013)   Hypertension    Hypothyroidism    Migraines    twice/yr maybe (01/19/2013)   Obstructive sleep apnea 05/03/2012   OSA  (obstructive sleep apnea)    sent me for test in 04/2012; never ordered mask, etc (01/19/2013)   PE (pulmonary thromboembolism) (HCC) 3 years ag0   3/notes 01/18/2013   Pneumonia before 2011   once' (01/18/2013)   Renal disorder    kindey function low; Metformin was destroying my kidneys (01/19/2013)   Shortness of breath    only related to my CHF (01/18/2013)   Swelling of hand 08/31/2014   RT HAND   Type II diabetes mellitus (HCC)    UTI (urinary tract infection) 08/31/2014    Medications:  Medications Prior to Admission  Medication Sig Dispense Refill Last Dose/Taking   allopurinol  (ZYLOPRIM ) 100 MG tablet Take 50 mg by mouth every other day.   12/15/2023   amLODipine  (NORVASC ) 5 MG tablet Take 2 tablets (10 mg total) by mouth daily.   12/15/2023   apixaban  (ELIQUIS ) 5 MG TABS tablet Take 5 mg by mouth 2 (two) times daily.   12/15/2023   ARIPiprazole  (ABILIFY ) 2 MG tablet Take 1 tablet (2 mg total) by mouth at bedtime.   12/15/2023   citalopram  (CELEXA ) 40 MG tablet Take 40 mg  by mouth at bedtime.   12/15/2023   diazepam  (VALIUM ) 5 MG tablet Take 1 tablet (5 mg total) by mouth 2 (two) times daily as needed for anxiety or muscle spasms. 10 tablet 0 12/15/2023   insulin  glargine (LANTUS  SOLOSTAR) 100 UNIT/ML Solostar Pen Inject 20 Units into the skin daily.   12/15/2023   insulin  lispro (HUMALOG) 100 UNIT/ML injection Inject 5 Units into the skin 3 (three) times daily before meals.   12/15/2023   iron  polysaccharides (NIFEREX) 150 MG capsule Take 1 capsule (150 mg total) by mouth daily.   12/15/2023   levothyroxine  (SYNTHROID ) 100 MCG tablet Take 1 tablet (100 mcg total) by mouth daily before breakfast.   12/15/2023   Melatonin 10 MG TABS Take 10 mg by mouth.   12/15/2023   Multiple Vitamins-Minerals (CENTRUM SILVER) CHEW Chew 2 each by mouth daily.   12/15/2023   rosuvastatin  (CRESTOR ) 10 MG tablet Take 1 tablet (10 mg total) by mouth daily. 30 tablet 0 12/15/2023   sennosides-docusate sodium  (SENOKOT-S)  8.6-50 MG tablet Take 2 tablets by mouth daily.   12/15/2023   insulin  aspart (NOVOLOG ) 100 UNIT/ML injection Inject 5 Units into the skin 3 (three) times daily with meals. (Patient not taking: Reported on 12/17/2023)   Not Taking    Assessment: 68 yo female with h/o PAF on apixaban  PTA admitted with AKI. MD wishes to convert to heparin  drip at this time with plans for liver biopsy. Last dose of apixaban  9/10 at ~1600.   9/16 AM: Heparin  has been on hold since early AM for liver biopsy. Consulted to resume at 1630 without bolus.    Goal of Therapy:  Heparin  level 0.3-0.7 units/ml aPTT 66-102 seconds Monitor platelets by anticoagulation protocol: Yes   Plan:  -Resume IV Heparin  1100 units/hr- start at 1630  -Monitor daily heparin  level and aPTT until correlation -Monitor Hgb, PLT and signs of bleeding -F/u switch back to apixaban    Massie Fila, PharmD Clinical Pharmacist  12/22/2023 1:11 PM

## 2023-12-22 NOTE — Progress Notes (Signed)
 Met with pt at bedside. Introduced self and explained role. Pt came from [facility] and plans to return to [facility] at d/c. Explained to pt that [facility] is requesting certain clinics and that navigator is working to get pt approved for a clinic that [facility] will transport pt to/from. Pt voiced understanding. Referral submitted to [facility] admissions this morning for review. Will assist as needed.   Randine Mungo Dialysis Navigator [phone]

## 2023-12-22 NOTE — Progress Notes (Signed)
 Physical Therapy Treatment Patient Details Name: [name] MRN: [id number] DOB: Mar 15, 1956 Today's Date: 12/22/2023   History of Present Illness Pt is a 68 y.o. female who presented 12/15/23 with SOB and volume overload secondary to LLL CAP and HFpEF exacerbation. Pt also with worsening renal failure and found to have liver lesions concerning for metastases. Pt recently admitted 7/28 - 8/31 for intentional overdose of OxyContin . PMH: HTN, afib, heart block s/p PPM, diastolic CHF, DM2 CKD stage IIIb, DVT/PE x 3, s/p IVC filter on Coumadin , hypothyroidism, OSA, HLD, anemia, obesity, pulmonary hypertension type III, gout    PT Comments  Pt with fair tolerance to treatment today. Pt limited by nausea and dry heaving once seated EOB. +2 Mod A x4 to sidestep along EOB. After each step pt needed to sit down and essentially laterally pivoted her way along the bed. Further mobility limited due to nausea and fatigue. No change in DC/DME recs at this time. PT will continue to follow.     If plan is discharge home, recommend the following: A little help with walking and/or transfers;A little help with bathing/dressing/bathroom;Assistance with cooking/housework;Assist for transportation;Help with stairs or ramp for entrance;Direct supervision/assist for medications management   Can travel by private vehicle     Yes  Equipment Recommendations  Wheelchair (measurements PT);Wheelchair cushion (measurements PT);BSC/3in1;Hospital bed;Rolling walker (2 wheels)    Recommendations for Other Services       Precautions / Restrictions Precautions Precautions: Fall Recall of Precautions/Restrictions: Intact Precaution/Restrictions Comments: watch SpO2; chronic lower leg wounds Restrictions Weight Bearing Restrictions Per Provider Order: No     Mobility  Bed Mobility Overal bed mobility: Needs Assistance Bed Mobility: Supine to Sit, Sit to Supine     Supine to sit: Mod assist Sit to supine: Mod  assist   General bed mobility comments: Pt needed Mod A HHA to pull trunk up to sit R EOB, using bed rail as well to pull up to sit. Mod A needed to lift legs back onto bed with return to supine.    Transfers Overall transfer level: Needs assistance Equipment used: Rolling walker (2 wheels) Transfers: Sit to/from Stand Sit to Stand: +2 physical assistance, Mod assist           General transfer comment: +2 Mod A x4 to sidestep along EOB. After each step pt needed to sit down and essentially laterally pivoted her way along the bed. Further mobility limited due to nausea.    Ambulation/Gait                   Stairs             Wheelchair Mobility     Tilt Bed    Modified Rankin (Stroke Patients Only)       Balance Overall balance assessment: Needs assistance Sitting-balance support: No upper extremity supported, Feet supported Sitting balance-[name]: Fair Sitting balance - Comments: sits statically EOB with supervision   Standing balance support: Bilateral upper extremity supported, During functional activity, Reliant on assistive device for balance Standing balance-[name]: Poor Standing balance comment: reliant on RW                            Communication Communication Communication: No apparent difficulties  Cognition Arousal: Alert Behavior During Therapy: WFL for tasks assessed/performed   PT - Cognitive impairments: No family/caregiver present to determine baseline  PT - Cognition Comments: Pt slow to respond initially, but pt's speed with response improved as session progressed. Pt aware of her urinary incontinence and able to sequence mobility Following commands: Intact Following commands impaired: Follows multi-step commands with increased time    Cueing Cueing Techniques: Verbal cues, Tactile cues  Exercises      General Comments General comments (skin integrity, edema, etc.): VSS       Pertinent Vitals/Pain Pain Assessment Pain Assessment: Faces Faces Pain Scale: Hurts even more Pain Location: Generalized Pain Descriptors / Indicators: Discomfort, Grimacing, Guarding, Sore Pain Intervention(s): Monitored during session, Limited activity within patient's tolerance, Repositioned    Home Living                          Prior Function            PT Goals (current goals can now be found in the care plan section) Progress towards PT goals: Progressing toward goals    Frequency    Min 2X/week      PT Plan      Co-evaluation              AM-PAC PT 6 Clicks Mobility   Outcome Measure  Help needed turning from your back to your side while in a flat bed without using bedrails?: A Little Help needed moving from lying on your back to sitting on the side of a flat bed without using bedrails?: A Little Help needed moving to and from a bed to a chair (including a wheelchair)?: A Little Help needed standing up from a chair using your arms (e.g., wheelchair or bedside chair)?: A Little Help needed to walk in hospital room?: Total Help needed climbing 3-5 steps with a railing? : Total 6 Click Score: 14    End of Session Equipment Utilized During Treatment: Gait belt;Oxygen Activity Tolerance: Patient limited by fatigue;Other (comment) (Limited by nausea) Patient left: in bed;with call bell/phone within reach;with bed alarm set Nurse Communication: Mobility status PT Visit Diagnosis: Difficulty in walking, not elsewhere classified (R26.2);Other abnormalities of gait and mobility (R26.89);Muscle weakness (generalized) (M62.81);Unsteadiness on feet (R26.81);Pain     Time: 1017-1050 PT Time Calculation (min) (ACUTE ONLY): 33 min  Charges:    $Therapeutic Activity: 23-37 mins PT General Charges $$ ACUTE PT VISIT: 1 Visit                     [name], PT, DPT Acute Rehab Services [phone]    [name] 12/22/2023, 11:53 AM

## 2023-12-22 NOTE — Plan of Care (Signed)

## 2023-12-22 NOTE — Progress Notes (Signed)
 PROGRESS NOTE                                                                                                                                                                                                             Patient Demographics:    [name], is a 68 y.o. female, DOB - October 01, 1955, FMW:[id number]  Outpatient Primary MD for the patient is Valma Carwin, MD    LOS - 5  Admit date - 12/15/2023    Chief Complaint  Patient presents with   Chest Pain       Brief Narrative (HPI from H&P)     68 y.o. female long-term resident at SNF with medical history significant of pulmonary hypertension, chronic respiratory failure requiring 2L  at baseline, Afib on Eliquis , HFpEF (EF 60-65% in 08/2023), DM2 (A1c 5.7 in 08/2023), DVT/PE x3 s/p IVC filter in 09/2009, CKD stage IV, and morbid obesity p/w SOB/cp iso volume overload 2/2 LLL CAP and HFpEF exacerbation c/b worsening renal failure and found to have liver lesions c/f metastases.    Subjective:   Patient in bed, appears comfortable, denies any headache, no fever, no chest pain or pressure, no shortness of breath , no abdominal pain. No focal weakness.  Still nauseated and still did not have a bowel movement.   Assessment  & Plan :   Respiratory insufficiency iso volume overload 2/2 LLL CAP and HFpEF exacerbation c/b worsening renal failure   Multifactorial SOB/cp but likely exacerbated by volume overload iso worsening renal failure  has significant volume overload, placed on IV diuretics per nephrology, shortness of breath and chest discomfort improved, continue to monitor with ongoing diuresis now HD started on 12/21/2023.   AKI on CKD (chronic kidney disease) stage 4, GFR 15-29 ml/min baseline creatinine around 2.6 -  Per nephrology, being diuresed but BUN getting extremely high with nausea, some of the nausea could be explained by constipation but BUN went extremely high,  right HD catheter placed by IR on 12/21/2023 and was started on dialysis.   UTI and pneumonia on admission.  Empiric antibiotics.  Speech eval.  Supportive care with oxygen and nebulizer treatments, cultures noted finished 5 days of total treatment.  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  Follow-up with PCP for weight loss.  BMI of 57  HFpEF EF  60% on recent echocardiogram recent [facility] noted no significant CAD, PA pressure 62/16, wedge of 11, PVR 4.4, group 3 pulmonary hypertension suspected, Diuresis per nephrology.   Liver lesions c/f metastases  - IR consulted patient was on Eliquis  biopsy hence biopsy is now tentatively being done on 12/22/2023 per IR schedule.  Does have very high CA 19.9 and CEA levels as well.  Postbiopsy results will involve GI and oncology as appropriate.   Paroxysmal Afib  -anticoagulation currently heparin  drip post biopsy will resume once cleared by IR, long-term on Eliquis .   Hypothyroid  -PTA Synthroid    HTN  -PTA amlodipine    Constipation with nausea.  Last BM per patient now over 4 weeks ago, on [place] bowel regimen including Colace, MiraLAX , Movantik , Dulcolax, lactulose .  Also received enema on 12/21/2023  Chronic lymphedema - Venous stasis ulcer of LLE  -Continue wrapping BLE and Unna boots to BLE as well   MDD  -PTA Abilify , Celexa , and Valium   DM2 -PTA long acting insulin  20U daily and SSI TID AC prn   Lab Results  Component Value Date   HGBA1C 5.7 (H) 09/04/2023   .CBG (last 3)  Recent Labs    12/21/23 2052 12/21/23 2112 12/21/23 2203  GLUCAP 121* 120* 265*          Condition - Extremely Guarded  Family Communication  :  none  Code Status :  Full  Consults  :  Renal, IR  PUD Prophylaxis :     Procedures  :     CT-guided liver biopsy due now on 12/22/2023.    Right IJ HD catheter placed by IR on 12/21/2023.    CT Liver - 1. Hypoattenuating liver lesions with peripheral enhancement, largest  measuring 7.9 cm in the inferior right hepatic lobe concerning for metastases. Differential consideration includes hepatic abscesses. Hepatic protocol MRI with and without IV contrast is recommended. 2. Small right pleural effusion and RLL pneumonia or atelectasis.      Disposition Plan  :    Status is: Inpatient   DVT Prophylaxis  :  Hep gtt   Lab Results  Component Value Date   PLT 129 (L) 12/22/2023    Diet :  Diet Order             Diet NPO time specified Except for: Ice Chips, Sips with Meds  Diet effective midnight                    Inpatient Medications  Scheduled Meds:  acetaminophen   1,000 mg Oral TID   amLODipine   10 mg Oral Daily   ARIPiprazole   2 mg Oral QHS   Chlorhexidine  Gluconate Cloth  6 each Topical Q0600   Chlorhexidine  Gluconate Cloth  6 each Topical Q0600   citalopram   20 mg Oral QHS   docusate sodium   200 mg Oral BID   furosemide   80 mg Intravenous BID   insulin  aspart  0-15 Units Subcutaneous TID WC   insulin  glargine  20 Units Subcutaneous Daily   levothyroxine   100 mcg Oral QAC breakfast   melatonin  10 mg Oral QHS   naloxegol  oxalate  25 mg Oral Daily   polyethylene glycol  17 g Oral BID   rosuvastatin   10 mg Oral Daily   Continuous Infusions:  heparin  Stopped (12/22/23 0615)   promethazine  (PHENERGAN ) injection (IM or IVPB) Stopped (12/20/23 2358)   PRN Meds:.diazepam , HYDROmorphone  (DILAUDID ) injection, ondansetron  (ZOFRAN ) IV, mouth rinse, promethazine  (PHENERGAN ) injection (IM or IVPB)  Objective:   Vitals:   12/21/23 1733 12/21/23 2045 12/22/23 0000 12/22/23 0400  BP: (!) 157/49 (!) 147/50 (!) 140/54 (!) 136/51  Pulse: (!) 54 (!) 49    Resp: 20 16 16 14   Temp: 97.8 F (36.6 C) 97.6 F (36.4 C)    TempSrc:  Temporal    SpO2: 100% 99% 99%   Weight: (S) (!) 139.8 kg     Height:        Wt Readings from Last 3 Encounters:  12/21/23 (S) (!) 139.8 kg  12/05/23 (!) 137.2 kg  11/05/23 136.1 kg     Intake/Output  Summary (Last 24 hours) at 12/22/2023 0753 Last data filed at 12/22/2023 0351 Gross per 24 hour  Intake 436.15 ml  Output 1000 ml  Net -563.85 ml     Physical Exam  Awake Alert, No new F.N deficits, Normal affect [place].AT,PERRAL Supple Neck, No JVD,   Symmetrical Chest wall movement, Good air movement bilaterally, CTAB RRR,No Gallops,Rubs or new Murmurs,  +ve B.Sounds, Abd Soft, No tenderness,   2+ edema, both lower extremities under bandage    Data Review:    Recent Labs  Lab 12/18/23 0248 12/19/23 0227 12/20/23 0704 12/21/23 0452 12/22/23 0324  WBC 6.0 5.6 6.8 8.5 7.7  HGB 7.7* 7.6* 8.3* 8.4* 8.2*  HCT 25.6* 26.0* 27.7* 27.9* 27.8*  PLT 133* 146* 163 150 129*  MCV 102.0* 102.8* 101.8* 102.2* 102.2*  MCH 30.7 30.0 30.5 30.8 30.1  MCHC 30.1 29.2* 30.0 30.1 29.5*  RDW 16.7* 16.9* 16.9* 16.9* 17.0*  LYMPHSABS 0.5* 0.4* 0.5* 0.5* 0.3*  MONOABS 0.6 0.7 0.8 0.8 0.6  EOSABS 0.2 0.2 0.1 0.2 0.1  BASOSABS 0.0 0.0 0.0 0.0 0.0    Recent Labs  Lab 12/15/23 2054 12/16/23 0013 12/17/23 0209 12/18/23 0248 12/19/23 0227 12/19/23 0952 12/20/23 0704 12/20/23 2345 12/21/23 0452 12/22/23 0324  NA 136  --  137 135 135  --  135  --  137 135  K 4.6  --  4.9 4.8 4.5  --  4.9  --  5.1 4.5  CL 98  --  100 100 100  --  100  --  100 100  CO2 24  --  25 23 24   --  22  --  21* 23  ANIONGAP 14  --  12 12 11   --  13  --  16* 12  GLUCOSE 225*  --  125* 125* 95  --  118*  --  96 99  BUN 135*  --  135* 138* 140*  --  143*  --  147* 121*  CREATININE 3.65*  --  3.38* 3.46* 3.55*  --  3.77*  --  4.07* 3.77*  AST 23  --   --  17 18  --  26  --  27 22  ALT 16  --   --  14 14  --  17  --  18 14  ALKPHOS 79  --   --  63 60  --  69  --  71 70  BILITOT 0.5  --   --  0.6 0.5  --  0.6  --  0.6 0.4  ALBUMIN  3.1*  --  2.7* 2.6* 2.9*  --  3.1*  --  3.0* 2.7*  LATICACIDVEN  --  1.0  --   --   --   --   --   --   --   --   INR  --   --  2.1*  --   --  1.6*  --  1.5*  --   --   BNP 583.7*  --   --   --    --   --   --   --   --   --   MG  --   --   --  2.7* 2.8*  --  2.8*  --  2.9*  --   PHOS  --   --  5.5*  --   --   --   --   --   --   --   CALCIUM  8.6*  --  8.4* 8.4* 8.4*  --  8.6*  --  8.6* 8.3*      Recent Labs  Lab 12/15/23 2054 12/16/23 0013 12/17/23 0209 12/18/23 0248 12/19/23 0227 12/19/23 0952 12/20/23 0704 12/20/23 2345 12/21/23 0452 12/22/23 0324  LATICACIDVEN  --  1.0  --   --   --   --   --   --   --   --   INR  --   --  2.1*  --   --  1.6*  --  1.5*  --   --   BNP 583.7*  --   --   --   --   --   --   --   --   --   MG  --   --   --  2.7* 2.8*  --  2.8*  --  2.9*  --   CALCIUM  8.6*  --  8.4* 8.4* 8.4*  --  8.6*  --  8.6* 8.3*    --------------------------------------------------------------------------------------------------------------- Lab Results  Component Value Date   CHOL 105 09/03/2023   HDL 50 09/03/2023   LDLCALC 44 09/03/2023   TRIG 56 09/03/2023   CHOLHDL 2.1 09/03/2023    Lab Results  Component Value Date   HGBA1C 5.7 (H) 09/04/2023   No results for input(s): TSH, T4TOTAL, FREET4, T3FREE, THYROIDAB in the last 72 hours. No results for input(s): VITAMINB12, FOLATE, FERRITIN, TIBC, IRON , RETICCTPCT in the last 72 hours.    Micro Results Recent Results (from the past 240 hours)  Urine Culture     Status: Abnormal   Collection Time: 12/16/23 12:13 AM   Specimen: Urine, Clean Catch  Result Value Ref Range Status   Specimen Description URINE, CLEAN CATCH  Final   Special Requests   Final    NONE Performed at [facility], [address], [place], [place] [address]    Culture 80,000 COLONIES/mL CITROBACTER FREUNDII (A)  Final   Report Status 12/19/2023 FINAL  Final   Organism ID, Bacteria CITROBACTER FREUNDII (A)  Final      Susceptibility   Citrobacter freundii - MIC*    CEFEPIME  <=0.12 SENSITIVE Sensitive     ERTAPENEM <=0.12 SENSITIVE Sensitive     CEFTRIAXONE  32 RESISTANT Resistant     CIPROFLOXACIN   <=0.06 SENSITIVE Sensitive     GENTAMICIN  <=1 SENSITIVE Sensitive     NITROFURANTOIN <=16 SENSITIVE Sensitive     TRIMETH /SULFA  >=320 RESISTANT Resistant     PIP/TAZO Value in next row Sensitive ug/mL     <=4 SENSITIVEThis is a modified FDA-approved test that has been validated and its performance characteristics determined by the reporting laboratory.  This laboratory is certified under the Clinical Laboratory Improvement Amendments CLIA as qualified to perform high complexity clinical laboratory testing.    MEROPENEM Value in next row Sensitive      <=4  SENSITIVEThis is a modified FDA-approved test that has been validated and its performance characteristics determined by the reporting laboratory.  This laboratory is certified under the Clinical Laboratory Improvement Amendments CLIA as qualified to perform high complexity clinical laboratory testing.    * 80,000 COLONIES/mL CITROBACTER FREUNDII  Blood culture (routine x 2)     Status: None   Collection Time: 12/16/23 12:13 AM   Specimen: BLOOD  Result Value Ref Range Status   Specimen Description BLOOD SITE NOT SPECIFIED  Final   Special Requests   Final    BOTTLES DRAWN AEROBIC AND ANAEROBIC Blood Culture results may not be optimal due to an inadequate volume of blood received in culture bottles   Culture   Final    NO GROWTH 5 DAYS Performed at [facility], [address], [place], [place] [address]    Report Status 12/21/2023 FINAL  Final  Blood culture (routine x 2)     Status: None   Collection Time: 12/16/23 12:13 AM   Specimen: BLOOD  Result Value Ref Range Status   Specimen Description BLOOD SITE NOT SPECIFIED  Final   Special Requests   Final    BOTTLES DRAWN AEROBIC AND ANAEROBIC Blood Culture adequate volume   Culture   Final    NO GROWTH 5 DAYS Performed at [facility], [address] [place], [place] [address]    Report Status 12/21/2023 FINAL  Final    Radiology Report US  Abdomen Limited RUQ  (LIVER/GB) Result Date: 12/21/2023 CLINICAL DATA:  Transaminitis EXAM: ULTRASOUND ABDOMEN LIMITED RIGHT UPPER QUADRANT COMPARISON:  CT scan 12/16/2023 FINDINGS: Gallbladder: Surgically absent Common bile duct: Diameter: 0.6 cm Liver: Abnormal nodular contour of the liver with heterogeneous liver echogenicity and poor sonic penetration. The hypoenhancing masses/lesions throughout the liver shown on 12/16/2023 not well seen, possibly due to lesser conspicuity on ultrasound compared to CT. Portal vein is patent on color Doppler imaging with normal direction of blood flow towards the liver. Other: Small right pleural effusion. IMPRESSION: 1. Abnormal nodular contour of the liver with heterogeneous liver echogenicity and poor sonic penetration. The hypoenhancing masses/lesions throughout the liver shown on 12/16/2023 not well seen, possibly due to lesser conspicuity on ultrasound compared to CT. 2. Small right pleural effusion. 3. Prior cholecystectomy. Electronically Signed   By: Ryan Salvage M.D.   On: 12/21/2023 20:51   IR TUNNELED CENTRAL VENOUS CATH [facility] W IMG Result Date: 12/21/2023 CLINICAL DATA:  Renal failure and need for tunneled hemodialysis catheter. EXAM: TUNNELED CENTRAL VENOUS HEMODIALYSIS CATHETER PLACEMENT WITH ULTRASOUND AND FLUOROSCOPIC GUIDANCE ANESTHESIA/SEDATION: Moderate (conscious) sedation was employed during this procedure. A total of Versed  1.0 mg and Fentanyl  50 mcg was administered intravenously. Moderate Sedation Time: 28 minutes. The patient's level of consciousness and vital signs were monitored continuously by radiology nursing throughout the procedure under my direct supervision. MEDICATIONS: 1 g IV Ancef . FLUOROSCOPY: Radiation Exposure Index: 18.1 mGy Kerma PROCEDURE: The procedure, risks, benefits, and alternatives were explained to the patient. Questions regarding the procedure were encouraged and answered. The patient understands and consents to the procedure. A timeout was  performed prior to initiating the procedure. The right neck and chest were prepped with chlorhexidine  in a sterile fashion, and a sterile drape was applied covering the operative field. Maximum barrier sterile technique with sterile gowns and gloves were used for the procedure. Local anesthesia was provided with 1% lidocaine . Ultrasound was performed to confirm patency of the right internal jugular vein. An ultrasound image  was saved and recorded. After creating a small venotomy incision, a 21 gauge needle was advanced into the right internal jugular vein under direct, real-time ultrasound guidance. After securing guidewire access, an 8 Fr dilator was placed. A J-wire was kinked to measure appropriate catheter length. A Palindrome tunneled hemodialysis catheter measuring 23 cm from tip to cuff was chosen for placement. This was tunneled in a retrograde fashion from the chest wall to the venotomy incision. At the venotomy, serial dilatation was performed and a 15 Fr peel-away sheath was placed over a guidewire. The catheter was then placed through the sheath and the sheath removed. Final catheter positioning was confirmed and documented with a fluoroscopic spot image. The catheter was aspirated, flushed with saline, and injected with appropriate volume heparin  dwells. The venotomy incision was closed with subcutaneous and subcuticular 4-0 Vicryl. Dermabond was applied to the incision. The catheter exit site was secured with 0-Prolene retention sutures. COMPLICATIONS: None.  No pneumothorax. FINDINGS: After catheter placement, the tip lies in the right atrium. The catheter aspirates normally and is ready for immediate use. IMPRESSION: Placement of tunneled hemodialysis catheter via the right internal jugular vein. The catheter tip lies in the right atrium. The catheter is ready for immediate use. Electronically Signed   By: Marcey Moan M.D.   On: 12/21/2023 16:24   DG Abd 1 View Result Date: 12/20/2023 CLINICAL  DATA:  67 year old female with constipation. EXAM: ABDOMEN - 1 VIEW COMPARISON:  CT Abdomen and Pelvis 12/16/2023 and earlier. FINDINGS: AP supine views at 0759 hours. Four views. Non obstructed bowel gas pattern. IVC filter redemonstrated. Stable cholecystectomy clips. Moderate volume retained rectosigmoid colon stool. Mild large bowel retained stool elsewhere. Stable visualized osseous structures. Advanced lower lumbar disc and endplate degeneration. Grossly negative lung bases, cardiac pacemaker lead redemonstrated. IMPRESSION: Non obstructed bowel gas pattern with moderate volume retained stool, primarily in the rectosigmoid. Electronically Signed   By: VEAR Hurst M.D.   On: 12/20/2023 08:19      Signature  -   Lavada Stank M.D on 12/22/2023 at 7:53 AM   -  To page go to [email or web address]

## 2023-12-22 NOTE — Progress Notes (Signed)
  Received patient in bed to unit.   Informed consent signed and in chart.    TX duration:     Transported by  Hand-off given to patient's nurse.    Access used:  RT CVC Access issues:  Nopne Dressing changed   Total UF removed: 1.5 Medication(s) given: none Post HD VS: 148/57 Post HD weight: 143.4 kg     Hunter Hacking LPN Kidney Dialysis Unit

## 2023-12-23 DIAGNOSIS — J189 Pneumonia, unspecified organism: Secondary | ICD-10-CM | POA: Diagnosis not present

## 2023-12-23 LAB — APTT
aPTT: 62 s — ABNORMAL HIGH (ref 24–36)
aPTT: 67 s — ABNORMAL HIGH (ref 24–36)

## 2023-12-23 LAB — CBC
HCT: 30.2 % — ABNORMAL LOW (ref 36.0–46.0)
Hemoglobin: 9.1 g/dL — ABNORMAL LOW (ref 12.0–15.0)
MCH: 31.1 pg (ref 26.0–34.0)
MCHC: 30.1 g/dL (ref 30.0–36.0)
MCV: 103.1 fL — ABNORMAL HIGH (ref 80.0–100.0)
Platelets: 142 K/uL — ABNORMAL LOW (ref 150–400)
RBC: 2.93 MIL/uL — ABNORMAL LOW (ref 3.87–5.11)
RDW: 17.1 % — ABNORMAL HIGH (ref 11.5–15.5)
WBC: 7.6 K/uL (ref 4.0–10.5)
nRBC: 0 % (ref 0.0–0.2)

## 2023-12-23 LAB — RENAL FUNCTION PANEL
Albumin: 2.7 g/dL — ABNORMAL LOW (ref 3.5–5.0)
Anion gap: 18 — ABNORMAL HIGH (ref 5–15)
BUN: 84 mg/dL — ABNORMAL HIGH (ref 8–23)
CO2: 23 mmol/L (ref 22–32)
Calcium: 8.6 mg/dL — ABNORMAL LOW (ref 8.9–10.3)
Chloride: 99 mmol/L (ref 98–111)
Creatinine, Ser: 3.08 mg/dL — ABNORMAL HIGH (ref 0.44–1.00)
GFR, Estimated: 16 mL/min — ABNORMAL LOW (ref >60)
Glucose, Bld: 103 mg/dL — ABNORMAL HIGH (ref 70–99)
Phosphorus: 5.7 mg/dL — ABNORMAL HIGH (ref 2.5–4.6)
Potassium: 4.3 mmol/L (ref 3.5–5.1)
Sodium: 140 mmol/L (ref 135–145)

## 2023-12-23 LAB — GLUCOSE, CAPILLARY
Glucose-Capillary: 102 mg/dL — ABNORMAL HIGH (ref 70–99)
Glucose-Capillary: 122 mg/dL — ABNORMAL HIGH (ref 70–99)
Glucose-Capillary: 141 mg/dL — ABNORMAL HIGH (ref 70–99)
Glucose-Capillary: 149 mg/dL — ABNORMAL HIGH (ref 70–99)

## 2023-12-23 LAB — HEPARIN LEVEL (UNFRACTIONATED): Heparin Unfractionated: >1.1 [IU]/mL — ABNORMAL HIGH (ref 0.30–0.70)

## 2023-12-23 MED ORDER — CHLORHEXIDINE GLUCONATE CLOTH 2 % EX PADS: 6.0000 | MEDICATED_PAD | Freq: Every day | CUTANEOUS | Status: DC

## 2023-12-23 NOTE — Progress Notes (Signed)
 [name] PROGRESS NOTE  Assessment/ Plan: Pt is a 68 y.o. 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   # Acute kidney injury on CKD stage IV likely due to reduced effective renal perfusion caused by CHF/infection/recent prolonged hospitalization versus progressive CKD.   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 Dennise at [facility], last seen in 06/2023.  Since then patient had hospitalization at [facility] related with abscess, CHF, AKI on CKD.  Now presents with fluid overload in the setting of withholding diuretics and marginal UOP with IV diuretics.  Unfortunately showing signs of uremia including nausea and generalized weakness.  Started HD on 9/15 after tunneled catheter with IR ------------ - Next HD on 9/18, Thursday and for now per a TTS schedule  - Assess dialysis needs daily  - Follow her trends and assess for renal recovery.  Note multiple prior admissions with overload - she may have had CKD progression  - Spoke with HD SW to initiate CLIP as an AKI.  Given her history, unclear whether or not she will be able to successfully come off of HD    # Chronic hypoxic respiratory failure due to pneumonia, CHF:  - on lasix  80 mg IV BID currently - optimizing volume status with HD   # CHF exacerbation with preserved EF: Recent echo reviewed with EF 60 to 65%.  Cath with nonobstructive CAD. - optimize volume status with HD    # Liver lesions consistent with metastasis:  - IR consulted for biopsy which occurred on 9/16 - per primary team   # Anemia: Iron  saturation 9% with low serum iron , she has no bacteremia.   -  s/p IV iron  - we will hold ESA while undergoing evaluation for liver mass.  # CKD stage IV -CKD thought to be due to diabetic nephropathy and was followed by Dr. Dennise at [facility], last seen in 06/2023. Then multiple readmissions in the interim as above  Disposition - continue inpatient monitoring     Subjective:  She had 1.1 liters UOP over 9/16.  Last HD on 9/16 with 1.5 kg UF.  wears oxygen at home (2 liters).  She feels a little better today.    Review of systems:   States that her shortness of breath is a little better than prior  Denies n/v Denies chest pain  Making more urine  Neck pain    Objective Vital signs in last 24 hours: Vitals:   12/23/23 0400 12/23/23 0734 12/23/23 0918 12/23/23 1206  BP: (!) 142/60 (!) 147/58 (!) 147/58   Pulse: (!) 50     Resp: 15     Temp: (!) 97.5 F (36.4 C) 98.5 F (36.9 C)    TempSrc: Oral Oral  Oral  SpO2: 100%     Weight:      Height:       Weight change: 3 kg  Intake/Output Summary (Last 24 hours) at 12/23/2023 1549 Last data filed at 12/23/2023 0343 Gross per 24 hour  Intake 96.61 ml  Output 2625 ml  Net -2528.39 ml       Labs: RENAL PANEL Recent Labs  Lab 12/17/23  9790 12/18/23 0248 12/19/23 0227 12/20/23 0704 12/21/23 0452 12/22/23 0324 12/23/23 0232  NA 137 135 135 135 137 135 140  K 4.9 4.8 4.5 4.9 5.1 4.5 4.3  CL 100 100 100 100 100 100 99  CO2 25 23 24 22  21* 23 23  GLUCOSE 125* 125* 95 118* 96 99 103*  BUN 135* 138* 140* 143* 147* 121* 84*  CREATININE 3.38* 3.46* 3.55* 3.77* 4.07* 3.77* 3.08*  CALCIUM  8.4* 8.4* 8.4* 8.6* 8.6* 8.3* 8.6*  MG  --  2.7* 2.8* 2.8* 2.9*  --   --   PHOS 5.5*  --   --   --   --   --  5.7*  ALBUMIN  2.7* 2.6* 2.9* 3.1* 3.0* 2.7* 2.7*    Liver Function Tests: Recent Labs  Lab 12/20/23 0704 12/21/23 0452 12/22/23 0324 12/23/23 0232  AST 26 27 22   --   ALT 17 18 14   --   ALKPHOS 69 71 70  --   BILITOT 0.6 0.6 0.4  --   PROT 4.7* 5.4* 5.1*  --   ALBUMIN  3.1* 3.0* 2.7*  2.7*   No results for input(s): LIPASE, AMYLASE in the last 168 hours. No results for input(s): AMMONIA in the last 168 hours. CBC: Recent Labs    11/17/23 1113 11/18/23 1050 12/17/23 0209 12/18/23 0248 12/19/23 0227 12/20/23 0704 12/21/23 0452 12/22/23 0324 12/23/23 0232  HGB  --    < >  --    < > 7.6* 8.3* 8.4* 8.2* 9.1*  MCV  --    < >  --    < > 102.8* 101.8* 102.2* 102.2* 103.1*  VITAMINB12 480  --   --   --   --   --   --   --   --   FERRITIN  --   --  115  --   --   --   --   --   --   TIBC 245*  --  245*  --   --   --   --   --   --   IRON  33  --  22*  --   --   --   --   --   --    < > = values in this interval not displayed.    Cardiac Enzymes: No results for input(s): CKTOTAL, CKMB, CKMBINDEX, TROPONINI in the last 168 hours. CBG: Recent Labs  Lab 12/22/23 1141 12/22/23 1906 12/22/23 2105 12/23/23 0730 12/23/23 1206  GLUCAP 114* 97 143* 102* 122*    Iron  Studies:  No results for input(s): IRON , TIBC, TRANSFERRIN, FERRITIN in the last 72 hours.  Studies/Results: US  BIOPSY (LIVER) Result Date: 12/22/2023 INDICATION: Liver lesions, concerning for metastatic disease, unknown primary EXAM: ULTRASOUND CORE BIOPSY ILL-DEFINED PERIPHERAL RIGHT LIVER LESION MEDICATIONS: 1% lidocaine  local ANESTHESIA/SEDATION: Moderate (conscious) sedation was employed during this procedure. A total of Versed  2.0 mg and Fentanyl  100 mcg was administered intravenously by the radiology nurse. Total intra-service moderate Sedation Time: 11 minutes. The patient's level of consciousness and vital signs were monitored continuously by radiology nursing throughout the procedure under my direct supervision. COMPLICATIONS: None immediate. PROCEDURE: Informed written consent was obtained from the patient after a thorough discussion of the procedural risks, benefits and alternatives. All questions were addressed. Maximal Sterile Barrier Technique was utilized including caps, mask,  sterile gowns, sterile gloves, sterile drape, hand hygiene and skin antiseptic. A timeout was performed prior to the initiation of the procedure. Previous  imaging reviewed. Preliminary ultrasound performed. Exam is limited because of the patient's morbid obesity. Imaging the liver with ultrasound is challenging. An ill-defined hypoechoic area/mass in the right liver at the level of the portal vein was localized and marked for biopsy. This was correlated with the 12/16/2023 CT. Overlying skin marked. Under sterile conditions and local anesthesia, the 17 gauge 11.8 cm guide needle was advanced into the right peripheral hypoechoic mass. Needle position confirmed with ultrasound. 2 18 gauge core biopsies obtained. Samples were intact and non fragmented. These were placed in formalin. Needle tract occluded with Gel-Foam. Patient tolerated the procedure well. No immediate complication. IMPRESSION: Successful ultrasound ill-defined peripheral right liver subcapsular mass 18 gauge core biopsies. Electronically Signed   By: CHRISTELLA.  Shick M.D.   On: 12/22/2023 14:39   US  Abdomen Limited RUQ (LIVER/GB) Result Date: 12/21/2023 CLINICAL DATA:  Transaminitis EXAM: ULTRASOUND ABDOMEN LIMITED RIGHT UPPER QUADRANT COMPARISON:  CT scan 12/16/2023 FINDINGS: Gallbladder: Surgically absent Common bile duct: Diameter: 0.6 cm Liver: Abnormal nodular contour of the liver with heterogeneous liver echogenicity and poor sonic penetration. The hypoenhancing masses/lesions throughout the liver shown on 12/16/2023 not well seen, possibly due to lesser conspicuity on ultrasound compared to CT. Portal vein is patent on color Doppler imaging with normal direction of blood flow towards the liver. Other: Small right pleural effusion. IMPRESSION: 1. Abnormal nodular contour of the liver with heterogeneous liver echogenicity and poor sonic penetration. The hypoenhancing masses/lesions throughout the liver shown on 12/16/2023 not well seen, possibly due to  lesser conspicuity on ultrasound compared to CT. 2. Small right pleural effusion. 3. Prior cholecystectomy. Electronically Signed   By: Ryan Salvage M.D.   On: 12/21/2023 20:51     Medications: Infusions:  heparin  1,200 Units/hr (12/23/23 1335)   promethazine  (PHENERGAN ) injection (IM or IVPB) Stopped (12/20/23 2358)    Scheduled Medications:  acetaminophen   1,000 mg Oral TID   amLODipine   10 mg Oral Daily   ARIPiprazole   2 mg Oral QHS   bisacodyl   10 mg Oral Q0600   Chlorhexidine  Gluconate Cloth  6 each Topical Q0600   Chlorhexidine  Gluconate Cloth  6 each Topical Q0600   citalopram   20 mg Oral QHS   docusate sodium   200 mg Oral BID   furosemide   80 mg Intravenous BID   insulin  aspart  0-15 Units Subcutaneous TID WC   insulin  glargine  20 Units Subcutaneous Daily   levothyroxine   100 mcg Oral QAC breakfast   melatonin  10 mg Oral QHS   naloxegol  oxalate  25 mg Oral Daily   polyethylene glycol  17 g Oral BID   rosuvastatin   10 mg Oral Daily    have reviewed scheduled and prn medications.  Physical Exam:      General adult female in bed in no acute distress HEENT normocephalic atraumatic extraocular movements intact sclera anicteric Neck supple trachea midline Lungs clear but reduced to auscultation bilaterally normal work of breathing at rest on nasal cannula 2 liters Heart S1S2 no rub Abdomen soft nontender obese habitus Extremities diffuse edema; legs wrapped  Psych normal mood and affect Neuro alert and oriented x 3 provides  Access RIJ tunn catheter    Katheryn JAYSON Saba 12/23/2023,4:02 PM  LOS: 6 days

## 2023-12-23 NOTE — Plan of Care (Signed)

## 2023-12-23 NOTE — Progress Notes (Signed)
 PHARMACY - ANTICOAGULATION CONSULT NOTE  Pharmacy Consult for Heparin  drip Indication: atrial fibrillation  Allergies  Allergen Reactions   Ms Contin [Morphine] Hives, Rash and Other (See Comments)    Broke out in brown spots all over.     Patient Measurements: Height: 5' 1 (154.9 cm) Weight: (!) 138.5 kg (305 lb 5.4 oz) IBW/kg (Calculated) : 47.8 HEPARIN  DW (KG): 83.5  Vital Signs: Temp Source: Oral (09/17 1206) BP: 147/58 (09/17 0918)  Labs: Recent Labs    12/20/23 2345 12/21/23 0452 12/21/23 0452 12/22/23 0324 12/23/23 0232 12/23/23 2010  HGB  --  8.4*   < > 8.2* 9.1*  --   HCT  --  27.9*  --  27.8* 30.2*  --   PLT  --  150  --  129* 142*  --   APTT  --  74*   < > 70* 62* 67*  LABPROT 19.1*  --   --   --   --   --   INR 1.5*  --   --   --   --   --   HEPARINUNFRC  --  >1.10*  --  >1.10* >1.10*  --   CREATININE  --  4.07*  --  3.77* 3.08*  --    < > = values in this interval not displayed.    Estimated Creatinine Clearance: 23.5 mL/min (A) (by C-G formula based on SCr of 3.08 mg/dL (H)).   Medical History: Past Medical History:  Diagnosis Date   A-fib (HCC)    Anemia    Anticoagulated on Coumadin , chronically 09/03/2011   Anxiety    Arthritis    right hip; both knees; left wrist/shoulder; back (01/19/2013   Bleeding on Coumadin  08/2012; 01/18/2013   [facility] admissions (01/19/2013)   CHF (congestive heart failure) (HCC)    2-3 times (01/19/2013)   Chronic lower back pain    Depression    DVT (deep venous thrombosis) (HCC) 10 years ago   numerous/notes 01/18/2013   GERD (gastroesophageal reflux disease)    Gout    Headache(784.0)    maybe weekly (01/19/2013)   Heart murmur    High cholesterol    been off RX for this at one time (01/18/2013)   History of blood transfusion 1983; 04/2012   3 w/ childbirth; hospitalized for pain (01/19/2013)   Hypertension    Hypothyroidism    Migraines    twice/yr maybe (01/19/2013)   Obstructive sleep  apnea 05/03/2012   OSA (obstructive sleep apnea)    sent me for test in 04/2012; never ordered mask, etc (01/19/2013)   PE (pulmonary thromboembolism) (HCC) 3 years ag0   3/notes 01/18/2013   Pneumonia before 2011   once' (01/18/2013)   Renal disorder    kindey function low; Metformin was destroying my kidneys (01/19/2013)   Shortness of breath    only related to my CHF (01/18/2013)   Swelling of hand 08/31/2014   RT HAND   Type II diabetes mellitus (HCC)    UTI (urinary tract infection) 08/31/2014    Medications:  Medications Prior to Admission  Medication Sig Dispense Refill Last Dose/Taking   allopurinol  (ZYLOPRIM ) 100 MG tablet Take 50 mg by mouth every other day.   12/15/2023   amLODipine  (NORVASC ) 5 MG tablet Take 2 tablets (10 mg total) by mouth daily.   12/15/2023   apixaban  (ELIQUIS ) 5 MG TABS tablet Take 5 mg by mouth 2 (two) times daily.   12/15/2023   ARIPiprazole  (ABILIFY ) 2 MG  tablet Take 1 tablet (2 mg total) by mouth at bedtime.   12/15/2023   citalopram  (CELEXA ) 40 MG tablet Take 40 mg by mouth at bedtime.   12/15/2023   diazepam  (VALIUM ) 5 MG tablet Take 1 tablet (5 mg total) by mouth 2 (two) times daily as needed for anxiety or muscle spasms. 10 tablet 0 12/15/2023   insulin  glargine (LANTUS  SOLOSTAR) 100 UNIT/ML Solostar Pen Inject 20 Units into the skin daily.   12/15/2023   insulin  lispro (HUMALOG) 100 UNIT/ML injection Inject 5 Units into the skin 3 (three) times daily before meals.   12/15/2023   iron  polysaccharides (NIFEREX) 150 MG capsule Take 1 capsule (150 mg total) by mouth daily.   12/15/2023   levothyroxine  (SYNTHROID ) 100 MCG tablet Take 1 tablet (100 mcg total) by mouth daily before breakfast.   12/15/2023   Melatonin 10 MG TABS Take 10 mg by mouth.   12/15/2023   Multiple Vitamins-Minerals (CENTRUM SILVER) CHEW Chew 2 each by mouth daily.   12/15/2023   rosuvastatin  (CRESTOR ) 10 MG tablet Take 1 tablet (10 mg total) by mouth daily. 30 tablet 0 12/15/2023    sennosides-docusate sodium  (SENOKOT-S) 8.6-50 MG tablet Take 2 tablets by mouth daily.   12/15/2023   insulin  aspart (NOVOLOG ) 100 UNIT/ML injection Inject 5 Units into the skin 3 (three) times daily with meals. (Patient not taking: Reported on 12/17/2023)   Not Taking    Assessment: 68 yo female with h/o PAF on apixaban  PTA admitted with AKI. MD wishes to convert to heparin  drip at this time with plans for liver biopsy. Last dose of apixaban  9/10 at ~1600.    9/17 PM: aPTT 67, barely therapeutic, no infusion issues or new/worsening signs of bleeding. Will increase infusion rate to target closer to the middle of the aPTT goal range.  Goal of Therapy:  Heparin  level 0.3-0.7 units/ml aPTT 66-102 seconds Monitor platelets by anticoagulation protocol: Yes   Plan:  - Increase heparin  infusion to 1300 units/hour  - aPTT in 8 hours  -Monitor daily heparin  level and aPTT until correlation -Monitor Hgb, PLT and signs of bleeding -F/u switch back to apixaban    Larraine Brazier, PharmD Clinical Pharmacist 12/23/2023  9:01 PM **Pharmacist phone directory can now be found on [email or web address] ([email or web address]).  Listed under [facility].

## 2023-12-23 NOTE — Progress Notes (Signed)
 OT Cancellation Note  Patient Details Name: [name] MRN: [id number] DOB: Jan 09, 1956   Cancelled Treatment:    Reason Eval/Treat Not Completed: Patient declined, no reason specified (Patient stating she did not want to participate in OT until fecal tube was removed. OT to reattempt later today as schedule permits)  [name] [name] 12/23/2023, 11:08 AM  [name], OTA Acute Rehabilitation Services  Office [phone]

## 2023-12-23 NOTE — Progress Notes (Signed)
 Contacted [facility] admissions this morning requesting an update on pt's referral. Awaiting response. Will assist as needed.   Randine Mungo Dialysis Navigator [phone]

## 2023-12-23 NOTE — TOC Progression Note (Signed)
 Transition of Care [facility]) - Progression Note    Patient Details  Name: [name] MRN: [id number] Date of Birth: 1955-07-20  [facility] CM/SW Contact  Inocente GORMAN Kindle, LCSW Phone Number: 12/23/2023, 10:24 AM  Clinical Narrative:    CSW continuing to follow.    Expected Discharge Plan: Skilled Nursing Facility Barriers to Discharge: Continued Medical Work up, English as a second language teacher               Expected Discharge Plan and Services In-house Referral: Clinical Social Work   Post Acute Care Choice: Skilled Nursing Facility Living arrangements for the past 2 months: Single Family Home                                       Social Drivers of Health (SDOH) Interventions SDOH Screenings   Food Insecurity: No Food Insecurity (12/16/2023)  Housing: Low Risk  (12/16/2023)  Recent Concern: Housing - High Risk (11/05/2023)  Transportation Needs: No Transportation Needs (12/16/2023)  Utilities: Not At Risk (12/16/2023)  Alcohol  Screen: Low Risk  (11/05/2023)  Social Connections: Moderately Isolated (12/16/2023)  Tobacco Use: Medium Risk (12/20/2023)    Readmission Risk Interventions     No data to display

## 2023-12-23 NOTE — Progress Notes (Signed)
 PROGRESS NOTE                                                                                                                                                                                                             Patient Demographics:    [name], is a 68 y.o. female, DOB - 06-30-1955, FMW:[id number]  Outpatient Primary MD for the patient is Valma Carwin, MD    LOS - 6  Admit date - 12/15/2023    Chief Complaint  Patient presents with   Chest Pain       Brief Narrative (HPI from H&P)      68 y.o. female long-term resident at SNF with medical history significant of pulmonary hypertension, chronic respiratory failure requiring 2L [place] at baseline, Afib on Eliquis , HFpEF (EF 60-65% in 08/2023), DM2 (A1c 5.7 in 08/2023), DVT/PE x3 s/p IVC filter in 09/2009, CKD stage IV, and morbid obesity p/w SOB/cp iso volume overload 2/2 LLL CAP and HFpEF exacerbation c/b worsening renal failure and found to have liver lesions c/f metastases.    Subjective:   Patient in bed, comfortable, no nausea, no vomiting, no headache .  She has a Flexi-Seal   Assessment  & Plan :   Respiratory insufficiency iso volume overload 2/2 LLL CAP and HFpEF exacerbation c/b worsening renal failure   Multifactorial SOB/cp but likely exacerbated by volume overload iso worsening renal failure  has significant volume overload, placed on IV diuretics per nephrology, shortness of breath and chest discomfort improved, continue to monitor with ongoing diuresis now HD started on 12/21/2023.   AKI on CKD (chronic kidney disease) stage 4, GFR 15-29 ml/min baseline creatinine around 2.6 -  Per nephrology, being diuresed but BUN getting extremely high with nausea, some of the nausea could be explained by constipation but BUN went extremely high, right HD catheter placed by IR on 12/21/2023 and was started on dialysis. -HD management per renal -Remains on IV Lasix     UTI and pneumonia on admission.  Empiric antibiotics.  Speech eval.  Supportive care with oxygen and nebulizer treatments, cultures noted finished 5 days of total treatment.  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  Follow-up with PCP for weight loss.  BMI of 57  HFpEF EF 60% on recent echocardiogram recent R/LHC noted no significant CAD, PA pressure  62/16, wedge of 11, PVR 4.4, group 3 pulmonary hypertension suspected, Diuresis per nephrology.   Liver lesions c/f metastases   -Status post ultrasound-guided biopsy by IR 9/16.  Awaiting results. -Does have very high CA 19.9 and CEA levels as well.  Postbiopsy results will involve GI and oncology as appropriate.   Paroxysmal Afib  -anticoagulation currently heparin  drip post biopsy will resume once cleared by IR, long-term on Eliquis .   Hypothyroid  -PTA Synthroid    HTN  -PTA amlodipine    Constipation with nausea.  Last BM per patient now over 4 weeks ago, on [place] bowel regimen including Colace, MiraLAX , Movantik , Dulcolax, lactulose .  Also received enema on 12/21/2023  Chronic lymphedema - Venous stasis ulcer of LLE  -Continue wrapping BLE and Unna boots to BLE as well   MDD  -PTA Abilify , Celexa , and Valium   DM2 -PTA long acting insulin  20U daily and SSI TID AC prn   Lab Results  Component Value Date   HGBA1C 5.7 (H) 09/04/2023   .CBG (last 3)  Recent Labs    12/22/23 1906 12/22/23 2105 12/23/23 0730  GLUCAP 97 143* 102*          Condition - Extremely Guarded  Family Communication  :  none  Code Status :  Full  Consults  :  Renal, IR  PUD Prophylaxis :     Procedures  :     CT-guided liver biopsy due now on 12/22/2023.    Right IJ HD catheter placed by IR on 12/21/2023.    CT Liver - 1. Hypoattenuating liver lesions with peripheral enhancement, largest measuring 7.9 cm in the inferior right hepatic lobe concerning for metastases. Differential consideration includes hepatic  abscesses. Hepatic protocol MRI with and without IV contrast is recommended. 2. Small right pleural effusion and RLL pneumonia or atelectasis.      Disposition Plan  :    Status is: Inpatient   DVT Prophylaxis  :  Hep gtt   Lab Results  Component Value Date   PLT 142 (L) 12/23/2023    Diet :  Diet Order             Diet heart healthy/carb modified Room service appropriate? Yes; Fluid consistency: Thin  Diet effective now                    Inpatient Medications  Scheduled Meds:  acetaminophen   1,000 mg Oral TID   amLODipine   10 mg Oral Daily   ARIPiprazole   2 mg Oral QHS   bisacodyl   10 mg Oral Q0600   Chlorhexidine  Gluconate Cloth  6 each Topical Q0600   Chlorhexidine  Gluconate Cloth  6 each Topical Q0600   citalopram   20 mg Oral QHS   docusate sodium   200 mg Oral BID   furosemide   80 mg Intravenous BID   insulin  aspart  0-15 Units Subcutaneous TID WC   insulin  glargine  20 Units Subcutaneous Daily   levothyroxine   100 mcg Oral QAC breakfast   melatonin  10 mg Oral QHS   naloxegol  oxalate  25 mg Oral Daily   polyethylene glycol  17 g Oral BID   rosuvastatin   10 mg Oral Daily   Continuous Infusions:  heparin  1,100 Units/hr (12/23/23 0343)   promethazine  (PHENERGAN ) injection (IM or IVPB) Stopped (12/20/23 2358)   PRN Meds:.diazepam , HYDROmorphone  (DILAUDID ) injection, ondansetron  (ZOFRAN ) IV, mouth rinse, promethazine  (PHENERGAN ) injection (IM or IVPB)    Objective:   Vitals:   12/23/23 0000 12/23/23 0400  12/23/23 0734 12/23/23 0918  BP: (!) 138/58 (!) 142/60 (!) 147/58 (!) 147/58  Pulse: (!) 50 (!) 50    Resp: 20 15    Temp: 98.6 F (37 C) (!) 97.5 F (36.4 C) 98.5 F (36.9 C)   TempSrc: Oral Oral Oral   SpO2: 99% 100%    Weight:      Height:        Wt Readings from Last 3 Encounters:  12/22/23 (!) 138.5 kg  12/05/23 (!) 137.2 kg  11/05/23 136.1 kg     Intake/Output Summary (Last 24 hours) at 12/23/2023 1024 Last data filed at  12/23/2023 0343 Gross per 24 hour  Intake 96.61 ml  Output 2625 ml  Net -2528.39 ml     Physical Exam Awake Alert, Oriented X 3, No new F.N deficits, Normal affect Symmetrical Chest wall movement, Good air movement bilaterally, CTAB RRR,No Gallops,Rubs or new Murmurs, No Parasternal Heave +ve B.Sounds, Abd Soft, No tenderness, No rebound - guarding or rigidity. Edema bilaterally, both lower extremity bandaged  Flexi-Seal and Foley present     Data Review:    Recent Labs  Lab 12/18/23 0248 12/19/23 0227 12/20/23 0704 12/21/23 0452 12/22/23 0324 12/23/23 0232  WBC 6.0 5.6 6.8 8.5 7.7 7.6  HGB 7.7* 7.6* 8.3* 8.4* 8.2* 9.1*  HCT 25.6* 26.0* 27.7* 27.9* 27.8* 30.2*  PLT 133* 146* 163 150 129* 142*  MCV 102.0* 102.8* 101.8* 102.2* 102.2* 103.1*  MCH 30.7 30.0 30.5 30.8 30.1 31.1  MCHC 30.1 29.2* 30.0 30.1 29.5* 30.1  RDW 16.7* 16.9* 16.9* 16.9* 17.0* 17.1*  LYMPHSABS 0.5* 0.4* 0.5* 0.5* 0.3*  --   MONOABS 0.6 0.7 0.8 0.8 0.6  --   EOSABS 0.2 0.2 0.1 0.2 0.1  --   BASOSABS 0.0 0.0 0.0 0.0 0.0  --     Recent Labs  Lab 12/17/23 0209 12/18/23 0248 12/19/23 0227 12/19/23 0952 12/20/23 0704 12/20/23 2345 12/21/23 0452 12/22/23 0324 12/23/23 0232  NA 137 135 135  --  135  --  137 135 140  K 4.9 4.8 4.5  --  4.9  --  5.1 4.5 4.3  CL 100 100 100  --  100  --  100 100 99  CO2 25 23 24   --  22  --  21* 23 23  ANIONGAP 12 12 11   --  13  --  16* 12 18*  GLUCOSE 125* 125* 95  --  118*  --  96 99 103*  BUN 135* 138* 140*  --  143*  --  147* 121* 84*  CREATININE 3.38* 3.46* 3.55*  --  3.77*  --  4.07* 3.77* 3.08*  AST  --  17 18  --  26  --  27 22  --   ALT  --  14 14  --  17  --  18 14  --   ALKPHOS  --  63 60  --  69  --  71 70  --   BILITOT  --  0.6 0.5  --  0.6  --  0.6 0.4  --   ALBUMIN  2.7* 2.6* 2.9*  --  3.1*  --  3.0* 2.7* 2.7*  INR 2.1*  --   --  1.6*  --  1.5*  --   --   --   MG  --  2.7* 2.8*  --  2.8*  --  2.9*  --   --   PHOS 5.5*  --   --   --   --   --    --   --  5.7*  CALCIUM  8.4* 8.4* 8.4*  --  8.6*  --  8.6* 8.3* 8.6*      Recent Labs  Lab 12/17/23 0209 12/18/23 0248 12/19/23 0227 12/19/23 0952 12/20/23 0704 12/20/23 2345 12/21/23 0452 12/22/23 0324 12/23/23 0232  INR 2.1*  --   --  1.6*  --  1.5*  --   --   --   MG  --  2.7* 2.8*  --  2.8*  --  2.9*  --   --   CALCIUM  8.4* 8.4* 8.4*  --  8.6*  --  8.6* 8.3* 8.6*    --------------------------------------------------------------------------------------------------------------- Lab Results  Component Value Date   CHOL 105 09/03/2023   HDL 50 09/03/2023   LDLCALC 44 09/03/2023   TRIG 56 09/03/2023   CHOLHDL 2.1 09/03/2023    Lab Results  Component Value Date   HGBA1C 5.7 (H) 09/04/2023   No results for input(s): TSH, T4TOTAL, FREET4, T3FREE, THYROIDAB in the last 72 hours. No results for input(s): VITAMINB12, FOLATE, FERRITIN, TIBC, IRON , RETICCTPCT in the last 72 hours.    Micro Results Recent Results (from the past 240 hours)  Urine Culture     Status: Abnormal   Collection Time: 12/16/23 12:13 AM   Specimen: Urine, Clean Catch  Result Value Ref Range Status   Specimen Description URINE, CLEAN CATCH  Final   Special Requests   Final    NONE Performed at [facility], [address], [place], [place] [address]    Culture 80,000 COLONIES/mL CITROBACTER FREUNDII (A)  Final   Report Status 12/19/2023 FINAL  Final   Organism ID, Bacteria CITROBACTER FREUNDII (A)  Final      Susceptibility   Citrobacter freundii - MIC*    CEFEPIME  <=0.12 SENSITIVE Sensitive     ERTAPENEM <=0.12 SENSITIVE Sensitive     CEFTRIAXONE  32 RESISTANT Resistant     CIPROFLOXACIN  <=0.06 SENSITIVE Sensitive     GENTAMICIN  <=1 SENSITIVE Sensitive     NITROFURANTOIN <=16 SENSITIVE Sensitive     TRIMETH /SULFA  >=320 RESISTANT Resistant     PIP/TAZO Value in next row Sensitive ug/mL     <=4 SENSITIVEThis is a modified FDA-approved test that has been validated and  its performance characteristics determined by the reporting laboratory.  This laboratory is certified under the Clinical Laboratory Improvement Amendments CLIA as qualified to perform high complexity clinical laboratory testing.    MEROPENEM Value in next row Sensitive      <=4 SENSITIVEThis is a modified FDA-approved test that has been validated and its performance characteristics determined by the reporting laboratory.  This laboratory is certified under the Clinical Laboratory Improvement Amendments CLIA as qualified to perform high complexity clinical laboratory testing.    * 80,000 COLONIES/mL CITROBACTER FREUNDII  Blood culture (routine x 2)     Status: None   Collection Time: 12/16/23 12:13 AM   Specimen: BLOOD  Result Value Ref Range Status   Specimen Description BLOOD SITE NOT SPECIFIED  Final   Special Requests   Final    BOTTLES DRAWN AEROBIC AND ANAEROBIC Blood Culture results may not be optimal due to an inadequate volume of blood received in culture bottles   Culture   Final    NO GROWTH 5 DAYS Performed at [facility], [address], [place], [place] [address]    Report Status 12/21/2023 FINAL  Final  Blood culture (routine x 2)     Status: None   Collection Time: 12/16/23 12:13 AM  Specimen: BLOOD  Result Value Ref Range Status   Specimen Description BLOOD SITE NOT SPECIFIED  Final   Special Requests   Final    BOTTLES DRAWN AEROBIC AND ANAEROBIC Blood Culture adequate volume   Culture   Final    NO GROWTH 5 DAYS Performed at [facility], [address] [place], [place] [address]    Report Status 12/21/2023 FINAL  Final    Radiology Report US  BIOPSY (LIVER) Result Date: 12/22/2023 INDICATION: Liver lesions, concerning for metastatic disease, unknown primary EXAM: ULTRASOUND CORE BIOPSY ILL-DEFINED PERIPHERAL RIGHT LIVER LESION MEDICATIONS: 1% lidocaine  local ANESTHESIA/SEDATION: Moderate (conscious) sedation was employed during this procedure. A total  of Versed  2.0 mg and Fentanyl  100 mcg was administered intravenously by the radiology nurse. Total intra-service moderate Sedation Time: 11 minutes. The patient's level of consciousness and vital signs were monitored continuously by radiology nursing throughout the procedure under my direct supervision. COMPLICATIONS: None immediate. PROCEDURE: Informed written consent was obtained from the patient after a thorough discussion of the procedural risks, benefits and alternatives. All questions were addressed. Maximal Sterile Barrier Technique was utilized including caps, mask, sterile gowns, sterile gloves, sterile drape, hand hygiene and skin antiseptic. A timeout was performed prior to the initiation of the procedure. Previous imaging reviewed. Preliminary ultrasound performed. Exam is limited because of the patient's morbid obesity. Imaging the liver with ultrasound is challenging. An ill-defined hypoechoic area/mass in the right liver at the level of the portal vein was localized and marked for biopsy. This was correlated with the 12/16/2023 CT. Overlying skin marked. Under sterile conditions and local anesthesia, the 17 gauge 11.8 cm guide needle was advanced into the right peripheral hypoechoic mass. Needle position confirmed with ultrasound. 2 18 gauge core biopsies obtained. Samples were intact and non fragmented. These were placed in formalin. Needle tract occluded with Gel-Foam. Patient tolerated the procedure well. No immediate complication. IMPRESSION: Successful ultrasound ill-defined peripheral right liver subcapsular mass 18 gauge core biopsies. Electronically Signed   By: CHRISTELLA.  Shick M.D.   On: 12/22/2023 14:39   US  Abdomen Limited RUQ (LIVER/GB) Result Date: 12/21/2023 CLINICAL DATA:  Transaminitis EXAM: ULTRASOUND ABDOMEN LIMITED RIGHT UPPER QUADRANT COMPARISON:  CT scan 12/16/2023 FINDINGS: Gallbladder: Surgically absent Common bile duct: Diameter: 0.6 cm Liver: Abnormal nodular contour of the liver  with heterogeneous liver echogenicity and poor sonic penetration. The hypoenhancing masses/lesions throughout the liver shown on 12/16/2023 not well seen, possibly due to lesser conspicuity on ultrasound compared to CT. Portal vein is patent on color Doppler imaging with normal direction of blood flow towards the liver. Other: Small right pleural effusion. IMPRESSION: 1. Abnormal nodular contour of the liver with heterogeneous liver echogenicity and poor sonic penetration. The hypoenhancing masses/lesions throughout the liver shown on 12/16/2023 not well seen, possibly due to lesser conspicuity on ultrasound compared to CT. 2. Small right pleural effusion. 3. Prior cholecystectomy. Electronically Signed   By: Ryan Salvage M.D.   On: 12/21/2023 20:51   IR TUNNELED CENTRAL VENOUS CATH [facility] W IMG Result Date: 12/21/2023 CLINICAL DATA:  Renal failure and need for tunneled hemodialysis catheter. EXAM: TUNNELED CENTRAL VENOUS HEMODIALYSIS CATHETER PLACEMENT WITH ULTRASOUND AND FLUOROSCOPIC GUIDANCE ANESTHESIA/SEDATION: Moderate (conscious) sedation was employed during this procedure. A total of Versed  1.0 mg and Fentanyl  50 mcg was administered intravenously. Moderate Sedation Time: 28 minutes. The patient's level of consciousness and vital signs were monitored continuously by radiology nursing throughout the procedure under my direct supervision. MEDICATIONS: 1 g IV Ancef . FLUOROSCOPY:  Radiation Exposure Index: 18.1 mGy Kerma PROCEDURE: The procedure, risks, benefits, and alternatives were explained to the patient. Questions regarding the procedure were encouraged and answered. The patient understands and consents to the procedure. A timeout was performed prior to initiating the procedure. The right neck and chest were prepped with chlorhexidine  in a sterile fashion, and a sterile drape was applied covering the operative field. Maximum barrier sterile technique with sterile gowns and gloves were used for the  procedure. Local anesthesia was provided with 1% lidocaine . Ultrasound was performed to confirm patency of the right internal jugular vein. An ultrasound image was saved and recorded. After creating a small venotomy incision, a 21 gauge needle was advanced into the right internal jugular vein under direct, real-time ultrasound guidance. After securing guidewire access, an 8 Fr dilator was placed. A J-wire was kinked to measure appropriate catheter length. A Palindrome tunneled hemodialysis catheter measuring 23 cm from tip to cuff was chosen for placement. This was tunneled in a retrograde fashion from the chest wall to the venotomy incision. At the venotomy, serial dilatation was performed and a 15 Fr peel-away sheath was placed over a guidewire. The catheter was then placed through the sheath and the sheath removed. Final catheter positioning was confirmed and documented with a fluoroscopic spot image. The catheter was aspirated, flushed with saline, and injected with appropriate volume heparin  dwells. The venotomy incision was closed with subcutaneous and subcuticular 4-0 Vicryl. Dermabond was applied to the incision. The catheter exit site was secured with 0-Prolene retention sutures. COMPLICATIONS: None.  No pneumothorax. FINDINGS: After catheter placement, the tip lies in the right atrium. The catheter aspirates normally and is ready for immediate use. IMPRESSION: Placement of tunneled hemodialysis catheter via the right internal jugular vein. The catheter tip lies in the right atrium. The catheter is ready for immediate use. Electronically Signed   By: Marcey Moan M.D.   On: 12/21/2023 16:24      Signature  -   Brayton Lye M.D on 12/23/2023 at 10:24 AM   -  To page go to [email or web address]

## 2023-12-23 NOTE — Progress Notes (Signed)
 PHARMACY - ANTICOAGULATION CONSULT NOTE  Pharmacy Consult for Heparin  drip Indication: atrial fibrillation  Allergies  Allergen Reactions   Ms Contin [Morphine] Hives, Rash and Other (See Comments)    Broke out in brown spots all over.     Patient Measurements: Height: 5' 1 (154.9 cm) Weight: (!) 138.5 kg (305 lb 5.4 oz) IBW/kg (Calculated) : 47.8 HEPARIN  DW (KG): 83.5  Vital Signs: Temp: 97.5 F (36.4 C) (09/17 0400) Temp Source: Oral (09/17 0400) BP: 142/60 (09/17 0400) Pulse Rate: 50 (09/17 0400)  Labs: Recent Labs    12/20/23 2345 12/21/23 0452 12/22/23 0324 12/23/23 0232  HGB  --  8.4* 8.2*  --   HCT  --  27.9* 27.8*  --   PLT  --  150 129*  --   APTT  --  74* 70* 62*  LABPROT 19.1*  --   --   --   INR 1.5*  --   --   --   HEPARINUNFRC  --  >1.10* >1.10* >1.10*  CREATININE  --  4.07* 3.77*  --     Estimated Creatinine Clearance: 19.2 mL/min (A) (by C-G formula based on SCr of 3.77 mg/dL (H)).   Medical History: Past Medical History:  Diagnosis Date   A-fib (HCC)    Anemia    Anticoagulated on Coumadin , chronically 09/03/2011   Anxiety    Arthritis    right hip; both knees; left wrist/shoulder; back (01/19/2013   Bleeding on Coumadin  08/2012; 01/18/2013   [facility] admissions (01/19/2013)   CHF (congestive heart failure) (HCC)    2-3 times (01/19/2013)   Chronic lower back pain    Depression    DVT (deep venous thrombosis) (HCC) 10 years ago   numerous/notes 01/18/2013   GERD (gastroesophageal reflux disease)    Gout    Headache(784.0)    maybe weekly (01/19/2013)   Heart murmur    High cholesterol    been off RX for this at one time (01/18/2013)   History of blood transfusion 1983; 04/2012   3 w/ childbirth; hospitalized for pain (01/19/2013)   Hypertension    Hypothyroidism    Migraines    twice/yr maybe (01/19/2013)   Obstructive sleep apnea 05/03/2012   OSA (obstructive sleep apnea)    sent me for test in 04/2012; never ordered  mask, etc (01/19/2013)   PE (pulmonary thromboembolism) (HCC) 3 years ag0   3/notes 01/18/2013   Pneumonia before 2011   once' (01/18/2013)   Renal disorder    kindey function low; Metformin was destroying my kidneys (01/19/2013)   Shortness of breath    only related to my CHF (01/18/2013)   Swelling of hand 08/31/2014   RT HAND   Type II diabetes mellitus (HCC)    UTI (urinary tract infection) 08/31/2014    Medications:  Medications Prior to Admission  Medication Sig Dispense Refill Last Dose/Taking   allopurinol  (ZYLOPRIM ) 100 MG tablet Take 50 mg by mouth every other day.   12/15/2023   amLODipine  (NORVASC ) 5 MG tablet Take 2 tablets (10 mg total) by mouth daily.   12/15/2023   apixaban  (ELIQUIS ) 5 MG TABS tablet Take 5 mg by mouth 2 (two) times daily.   12/15/2023   ARIPiprazole  (ABILIFY ) 2 MG tablet Take 1 tablet (2 mg total) by mouth at bedtime.   12/15/2023   citalopram  (CELEXA ) 40 MG tablet Take 40 mg by mouth at bedtime.   12/15/2023   diazepam  (VALIUM ) 5 MG tablet Take 1 tablet (5 mg  total) by mouth 2 (two) times daily as needed for anxiety or muscle spasms. 10 tablet 0 12/15/2023   insulin  glargine (LANTUS  SOLOSTAR) 100 UNIT/ML Solostar Pen Inject 20 Units into the skin daily.   12/15/2023   insulin  lispro (HUMALOG) 100 UNIT/ML injection Inject 5 Units into the skin 3 (three) times daily before meals.   12/15/2023   iron  polysaccharides (NIFEREX) 150 MG capsule Take 1 capsule (150 mg total) by mouth daily.   12/15/2023   levothyroxine  (SYNTHROID ) 100 MCG tablet Take 1 tablet (100 mcg total) by mouth daily before breakfast.   12/15/2023   Melatonin 10 MG TABS Take 10 mg by mouth.   12/15/2023   Multiple Vitamins-Minerals (CENTRUM SILVER) CHEW Chew 2 each by mouth daily.   12/15/2023   rosuvastatin  (CRESTOR ) 10 MG tablet Take 1 tablet (10 mg total) by mouth daily. 30 tablet 0 12/15/2023   sennosides-docusate sodium  (SENOKOT-S) 8.6-50 MG tablet Take 2 tablets by mouth daily.   12/15/2023   insulin   aspart (NOVOLOG ) 100 UNIT/ML injection Inject 5 Units into the skin 3 (three) times daily with meals. (Patient not taking: Reported on 12/17/2023)   Not Taking    Assessment: 68 yo female with h/o PAF on apixaban  PTA admitted with AKI. MD wishes to convert to heparin  drip at this time with plans for liver biopsy. Last dose of apixaban  9/10 at ~1600.   9/17 AM: aPTT slightly subtherapeutic at 62s and HL remains falsely elevated with heparin  running at 1100 units/hour. CBC stable. No signs of bleeding or issues with the heparin  infusion noted.   Goal of Therapy:  Heparin  level 0.3-0.7 units/ml aPTT 66-102 seconds Monitor platelets by anticoagulation protocol: Yes   Plan:  - Increase heparin  infusion to 1200 units/hour  - aPTT in 8 hours  -Monitor daily heparin  level and aPTT until correlation -Monitor Hgb, PLT and signs of bleeding -F/u switch back to apixaban    Massie Fila, PharmD Clinical Pharmacist  12/23/2023 7:13 AM

## 2023-12-24 ENCOUNTER — Ambulatory Visit: Admitting: Urology

## 2023-12-24 DIAGNOSIS — C787 Secondary malignant neoplasm of liver and intrahepatic bile duct: Secondary | ICD-10-CM | POA: Diagnosis not present

## 2023-12-24 DIAGNOSIS — Z7189 Other specified counseling: Secondary | ICD-10-CM

## 2023-12-24 DIAGNOSIS — N179 Acute kidney failure, unspecified: Secondary | ICD-10-CM

## 2023-12-24 DIAGNOSIS — N185 Chronic kidney disease, stage 5: Secondary | ICD-10-CM | POA: Diagnosis not present

## 2023-12-24 DIAGNOSIS — N281 Cyst of kidney, acquired: Secondary | ICD-10-CM

## 2023-12-24 LAB — GLUCOSE, CAPILLARY
Glucose-Capillary: 103 mg/dL — ABNORMAL HIGH (ref 70–99)
Glucose-Capillary: 130 mg/dL — ABNORMAL HIGH (ref 70–99)
Glucose-Capillary: 144 mg/dL — ABNORMAL HIGH (ref 70–99)
Glucose-Capillary: 100 mg/dL — ABNORMAL HIGH (ref 70–99)
Glucose-Capillary: 94 mg/dL (ref 70–99)

## 2023-12-24 LAB — HEPARIN LEVEL (UNFRACTIONATED): Heparin Unfractionated: >1.1 [IU]/mL — ABNORMAL HIGH (ref 0.30–0.70)

## 2023-12-24 LAB — RENAL FUNCTION PANEL
Albumin: 2.7 g/dL — ABNORMAL LOW (ref 3.5–5.0)
Anion gap: 12 (ref 5–15)
BUN: 90 mg/dL — ABNORMAL HIGH (ref 8–23)
CO2: 28 mmol/L (ref 22–32)
Calcium: 8.6 mg/dL — ABNORMAL LOW (ref 8.9–10.3)
Chloride: 96 mmol/L — ABNORMAL LOW (ref 98–111)
Creatinine, Ser: 2.99 mg/dL — ABNORMAL HIGH (ref 0.44–1.00)
GFR, Estimated: 17 mL/min — ABNORMAL LOW (ref >60)
Glucose, Bld: 85 mg/dL (ref 70–99)
Phosphorus: 5.6 mg/dL — ABNORMAL HIGH (ref 2.5–4.6)
Potassium: 3.8 mmol/L (ref 3.5–5.1)
Sodium: 136 mmol/L (ref 135–145)

## 2023-12-24 LAB — APTT: aPTT: 81 s — ABNORMAL HIGH (ref 24–36)

## 2023-12-24 LAB — CBC
HCT: 29.6 % — ABNORMAL LOW (ref 36.0–46.0)
Hemoglobin: 8.7 g/dL — ABNORMAL LOW (ref 12.0–15.0)
MCH: 30.3 pg (ref 26.0–34.0)
MCHC: 29.4 g/dL — ABNORMAL LOW (ref 30.0–36.0)
MCV: 103.1 fL — ABNORMAL HIGH (ref 80.0–100.0)
Platelets: 131 K/uL — ABNORMAL LOW (ref 150–400)
RBC: 2.87 MIL/uL — ABNORMAL LOW (ref 3.87–5.11)
RDW: 17 % — ABNORMAL HIGH (ref 11.5–15.5)
WBC: 7.1 K/uL (ref 4.0–10.5)
nRBC: 0 % (ref 0.0–0.2)

## 2023-12-24 LAB — SURGICAL PATHOLOGY

## 2023-12-24 MED ORDER — INSULIN GLARGINE 100 UNIT/ML ~~LOC~~ SOLN
15.0000 [IU] | Freq: Every day | SUBCUTANEOUS | Status: DC
  Administered 2023-12-25: 15 [IU] via SUBCUTANEOUS
  Filled 2023-12-24: qty 0.15

## 2023-12-24 MED ORDER — HEPARIN SODIUM (PORCINE) 1000 UNIT/ML DIALYSIS
1000.0000 [IU] | INTRAMUSCULAR | Status: DC | PRN
  Administered 2023-12-24: 4000 [IU]

## 2023-12-24 MED ORDER — FUROSEMIDE 10 MG/ML IJ SOLN
80.0000 mg | INTRAMUSCULAR | Status: DC
Start: 2023-12-25 — End: 2023-12-25
  Administered 2023-12-25: 80 mg via INTRAVENOUS
  Filled 2023-12-24 (×2): qty 8

## 2023-12-24 MED ORDER — HEPARIN SODIUM (PORCINE) 1000 UNIT/ML IJ SOLN
INTRAMUSCULAR | Status: AC
Start: 2023-12-24 — End: 2023-12-24
  Filled 2023-12-24: qty 4

## 2023-12-24 MED ORDER — SENNOSIDES-DOCUSATE SODIUM 8.6-50 MG PO TABS
2.0000 | ORAL_TABLET | Freq: Every evening | ORAL | Status: DC | PRN
  Administered 2023-12-27: 2 via ORAL
  Filled 2023-12-24: qty 2

## 2023-12-24 MED ORDER — APIXABAN 5 MG PO TABS
5.0000 mg | ORAL_TABLET | Freq: Two times a day (BID) | ORAL | Status: DC
  Administered 2023-12-24 – 2023-12-25 (×3): 5 mg via ORAL
  Filled 2023-12-24 (×3): qty 1

## 2023-12-24 NOTE — Plan of Care (Signed)
  Problem: Education: Goal: Ability to describe self-care measures that may prevent or decrease complications (Diabetes Survival Skills Education) will improve Outcome: Progressing Goal: Individualized Educational Video(s) Outcome: Progressing   Problem: Coping: Goal: Ability to adjust to condition or change in health will improve Outcome: Progressing   Problem: Fluid Volume: Goal: Ability to maintain a balanced intake and output will improve Outcome: Progressing   Problem: Health Behavior/Discharge Planning: Goal: Ability to identify and utilize available resources and services will improve Outcome: Progressing Goal: Ability to manage health-related needs will improve Outcome: Progressing   Problem: Metabolic: Goal: Ability to maintain appropriate glucose levels will improve Outcome: Progressing   Problem: Nutritional: Goal: Maintenance of adequate nutrition will improve Outcome: Progressing Goal: Progress toward achieving an optimal weight will improve Outcome: Progressing   Problem: Skin Integrity: Goal: Risk for impaired skin integrity will decrease Outcome: Progressing   Problem: Tissue Perfusion: Goal: Adequacy of tissue perfusion will improve Outcome: Progressing   Problem: Education: Goal: Knowledge of General Education information will improve Description: Including pain rating scale, medication(s)/side effects and non-pharmacologic comfort measures Outcome: Progressing   Problem: Health Behavior/Discharge Planning: Goal: Ability to manage health-related needs will improve Outcome: Progressing   Problem: Clinical Measurements: Goal: Ability to maintain clinical measurements within normal limits will improve Outcome: Progressing Goal: Will remain free from infection Outcome: Progressing Goal: Diagnostic test results will improve Outcome: Progressing Goal: Respiratory complications will improve Outcome: Progressing Goal: Cardiovascular complication will  be avoided Outcome: Progressing   Problem: Activity: Goal: Risk for activity intolerance will decrease Outcome: Progressing   Problem: Nutrition: Goal: Adequate nutrition will be maintained Outcome: Progressing   Problem: Coping: Goal: Level of anxiety will decrease Outcome: Progressing   Problem: Elimination: Goal: Will not experience complications related to bowel motility Outcome: Progressing Goal: Will not experience complications related to urinary retention Outcome: Progressing   Problem: Pain Managment: Goal: General experience of comfort will improve and/or be controlled Outcome: Progressing   Problem: Safety: Goal: Ability to remain free from injury will improve Outcome: Progressing   Problem: Skin Integrity: Goal: Risk for impaired skin integrity will decrease Outcome: Progressing   Problem: Education: Goal: Knowledge of disease and its progression will improve Outcome: Progressing   Problem: Health Behavior/Discharge Planning: Goal: Ability to manage health-related needs will improve Outcome: Progressing   Problem: Clinical Measurements: Goal: Complications related to the disease process or treatment will be avoided or minimized Outcome: Progressing Goal: Dialysis access will remain free of complications Outcome: Progressing   Problem: Activity: Goal: Activity intolerance will improve Outcome: Progressing   Problem: Fluid Volume: Goal: Fluid volume balance will be maintained or improved Outcome: Progressing   Problem: Nutritional: Goal: Ability to make appropriate dietary choices will improve Outcome: Progressing   Problem: Respiratory: Goal: Respiratory symptoms related to disease process will be avoided Outcome: Progressing   Problem: Self-Concept: Goal: Body image disturbance will be avoided or minimized Outcome: Progressing   Problem: Urinary Elimination: Goal: Progression of disease will be identified and treated Outcome:  Progressing

## 2023-12-24 NOTE — Progress Notes (Signed)
Patient arrived back to unit from dialysis.

## 2023-12-24 NOTE — TOC Progression Note (Addendum)
 Transition of Care [facility]) - Progression Note    Patient Details  Name: [name] MRN: [id number] Date of Birth: January 22, 1956  [facility] CM/SW Contact  Inocente GORMAN Kindle, LCSW Phone Number: 12/24/2023, 1:55 PM  Clinical Narrative:    CSW confirmed with [facility] that they can accommodate patient's new dialysis schedule. Plan to discharge there Sunday pending insurance approval. CSW initiated insurance process Certification Number [id number].     Expected Discharge Plan: Skilled Nursing Facility Barriers to Discharge: Continued Medical Work up, English as a second language teacher               Expected Discharge Plan and Services In-house Referral: Clinical Social Work   Post Acute Care Choice: Skilled Nursing Facility Living arrangements for the past 2 months: Single Family Home                                       Social Drivers of Health (SDOH) Interventions SDOH Screenings   Food Insecurity: No Food Insecurity (12/16/2023)  Housing: Low Risk  (12/16/2023)  Recent Concern: Housing - High Risk (11/05/2023)  Transportation Needs: No Transportation Needs (12/16/2023)  Utilities: Not At Risk (12/16/2023)  Alcohol  Screen: Low Risk  (11/05/2023)  Social Connections: Moderately Isolated (12/16/2023)  Tobacco Use: Medium Risk (12/20/2023)    Readmission Risk Interventions     No data to display

## 2023-12-24 NOTE — Progress Notes (Addendum)
 PHARMACY - ANTICOAGULATION CONSULT NOTE  Pharmacy Consult for Heparin  drip Indication: atrial fibrillation  Allergies  Allergen Reactions   Ms Contin [Morphine] Hives, Rash and Other (See Comments)    Broke out in brown spots all over.     Patient Measurements: Height: 5' 1 (154.9 cm) Weight: (!) 138.5 kg (305 lb 5.4 oz) IBW/kg (Calculated) : 47.8 HEPARIN  DW (KG): 83.5  Vital Signs: Temp: 98.4 F (36.9 C) (09/18 0515) Temp Source: Oral (09/18 0515) BP: 157/60 (09/18 0416) Pulse Rate: 50 (09/18 0515)  Labs: Recent Labs    12/22/23 0324 12/23/23 0232 12/23/23 2010 12/24/23 0637  HGB 8.2* 9.1*  --  8.7*  HCT 27.8* 30.2*  --  29.6*  PLT 129* 142*  --  131*  APTT 70* 62* 67*  --   HEPARINUNFRC >1.10* >1.10*  --   --   CREATININE 3.77* 3.08*  --   --     Estimated Creatinine Clearance: 23.5 mL/min (A) (by C-G formula based on SCr of 3.08 mg/dL (H)).   Medical History: Past Medical History:  Diagnosis Date   A-fib (HCC)    Anemia    Anticoagulated on Coumadin , chronically 09/03/2011   Anxiety    Arthritis    right hip; both knees; left wrist/shoulder; back (01/19/2013   Bleeding on Coumadin  08/2012; 01/18/2013   [facility] admissions (01/19/2013)   CHF (congestive heart failure) (HCC)    2-3 times (01/19/2013)   Chronic lower back pain    Depression    DVT (deep venous thrombosis) (HCC) 10 years ago   numerous/notes 01/18/2013   GERD (gastroesophageal reflux disease)    Gout    Headache(784.0)    maybe weekly (01/19/2013)   Heart murmur    High cholesterol    been off RX for this at one time (01/18/2013)   History of blood transfusion 1983; 04/2012   3 w/ childbirth; hospitalized for pain (01/19/2013)   Hypertension    Hypothyroidism    Migraines    twice/yr maybe (01/19/2013)   Obstructive sleep apnea 05/03/2012   OSA (obstructive sleep apnea)    sent me for test in 04/2012; never ordered mask, etc (01/19/2013)   PE (pulmonary  thromboembolism) (HCC) 3 years ag0   3/notes 01/18/2013   Pneumonia before 2011   once' (01/18/2013)   Renal disorder    kindey function low; Metformin was destroying my kidneys (01/19/2013)   Shortness of breath    only related to my CHF (01/18/2013)   Swelling of hand 08/31/2014   RT HAND   Type II diabetes mellitus (HCC)    UTI (urinary tract infection) 08/31/2014    Medications:  Medications Prior to Admission  Medication Sig Dispense Refill Last Dose/Taking   allopurinol  (ZYLOPRIM ) 100 MG tablet Take 50 mg by mouth every other day.   12/15/2023   amLODipine  (NORVASC ) 5 MG tablet Take 2 tablets (10 mg total) by mouth daily.   12/15/2023   apixaban  (ELIQUIS ) 5 MG TABS tablet Take 5 mg by mouth 2 (two) times daily.   12/15/2023   ARIPiprazole  (ABILIFY ) 2 MG tablet Take 1 tablet (2 mg total) by mouth at bedtime.   12/15/2023   citalopram  (CELEXA ) 40 MG tablet Take 40 mg by mouth at bedtime.   12/15/2023   diazepam  (VALIUM ) 5 MG tablet Take 1 tablet (5 mg total) by mouth 2 (two) times daily as needed for anxiety or muscle spasms. 10 tablet 0 12/15/2023   insulin  glargine (LANTUS  SOLOSTAR) 100 UNIT/ML Solostar Pen  Inject 20 Units into the skin daily.   12/15/2023   insulin  lispro (HUMALOG) 100 UNIT/ML injection Inject 5 Units into the skin 3 (three) times daily before meals.   12/15/2023   iron  polysaccharides (NIFEREX) 150 MG capsule Take 1 capsule (150 mg total) by mouth daily.   12/15/2023   levothyroxine  (SYNTHROID ) 100 MCG tablet Take 1 tablet (100 mcg total) by mouth daily before breakfast.   12/15/2023   Melatonin 10 MG TABS Take 10 mg by mouth.   12/15/2023   Multiple Vitamins-Minerals (CENTRUM SILVER) CHEW Chew 2 each by mouth daily.   12/15/2023   rosuvastatin  (CRESTOR ) 10 MG tablet Take 1 tablet (10 mg total) by mouth daily. 30 tablet 0 12/15/2023   sennosides-docusate sodium  (SENOKOT-S) 8.6-50 MG tablet Take 2 tablets by mouth daily.   12/15/2023   insulin  aspart (NOVOLOG ) 100 UNIT/ML injection  Inject 5 Units into the skin 3 (three) times daily with meals. (Patient not taking: Reported on 12/17/2023)   Not Taking    Assessment: 68 yo female with h/o PAF on apixaban  PTA admitted with AKI. MD wishes to convert to heparin  drip at this time with plans for liver biopsy. Last dose of apixaban  9/10 at ~1600.   9/18 AM: aPTT 81- therapeutic no infusion issues or new/worsening signs of bleeding. CBC stable.   Goal of Therapy:  Heparin  level 0.3-0.7 units/ml aPTT 66-102 seconds Monitor platelets by anticoagulation protocol: Yes   Plan:  - Cont heparin  infusion at 1300 units/hour  -Monitor daily heparin  level and aPTT until correlation -Monitor Hgb, PLT and signs of bleeding -F/u switch back to apixaban    ADDENDUM: - STOP heparin  gtt  - START apixaban  5 mg PO BID as per PTA regimen- administer first dose at the same time the heparin  infusion is stopped - CBC in AM   Massie Fila, PharmD Clinical Pharmacist  12/24/2023 7:36 AM

## 2023-12-24 NOTE — Consult Note (Signed)
 [facility] Hematology and oncology consult note   Patient Care Team: Valma Carwin, MD as PCP - General (Internal Medicine) Shlomo Wilbert SAUNDERS, MD as PCP - Sleep Medicine (Cardiology) Loni Soyla LABOR, MD as PCP - Cardiology (Cardiology)   ASSESSMENT & PLAN:  68 y.o.female with past medical history of pulmonary hypertension, chronic respiratory failure requiring 2L [place] at baseline, Afib on Eliquis , HFpEF (EF 60-65% in 08/2023), DM2 (A1c 5.7 in 08/2023), DVT/PE x3 s/p IVC filter in 09/2009, CKD stage IV, and morbid obesity consulted for liver metastases.  Reviewed CT scan and pathology.  Patient has diffuse liver metastases.  Atypical cells concerning for carcinoma.  CA 19-9 is 1968.  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  She has morbid obesity, multiple organ dysfunctions.  She is not in good performance status to receive cytotoxic chemotherapy.  I recommend doing no harm and keeping the patient comfortable is very reasonable.  I also spoke to sister over the phone at her request at bedside as well.  Recommend hospice evaluation to provide comfort measures.  Please consult palliative care and hospice  All questions were answered.  Please call us  if any questions.    Leah JAYSON Chihuahua, MD 12/24/2023 7:01 PM   CHIEF COMPLAINTS/PURPOSE OF ADMISSION Liver metastases  HISTORY OF PRESENTING ILLNESS:  [name] 68 y.o. female consulted for liver metastases. Patient is admitted for volume overload.  Report patient was having CHF exacerbation complicated by worsening renal failure and required dialysis.  CT scan on admission showed diffuse liver metastases.  HEPATOBILIARY: Redemonstrated hypoattenuating lesions throughout the liver with peripheral enhancement on the portal venous phase in the right base. The largest lesion is in the inferior right hepatic lobe measuring 7.9 cm (series 8 image 92).  Patient underwent biopsy on 9/16 and results show really  atypical cells concerning for carcinoma.  Reports she has not felt well for a while.  She has some subacute decline of her physical status.  At baseline, she cannot get up by herself.  She needs assistance.  She has pulmonary hypertension, chronic respiratory failure on 2 L of oxygen, profound obesity, atrial fibrillation on Eliquis , heart failure with preserved ejection fraction, type 2 diabetes, IVC filter for previous history of DVT and PE.  MEDICAL HISTORY:  Past Medical History:  Diagnosis Date   A-fib (HCC)    Anemia    Anticoagulated on Coumadin , chronically 09/03/2011   Anxiety    Arthritis    right hip; both knees; left wrist/shoulder; back (01/19/2013   Bleeding on Coumadin  08/2012; 01/18/2013   [facility] admissions (01/19/2013)   CHF (congestive heart failure) (HCC)    2-3 times (01/19/2013)   Chronic lower back pain    Depression    DVT (deep venous thrombosis) (HCC) 10 years ago   numerous/notes 01/18/2013   GERD (gastroesophageal reflux disease)    Gout    Headache(784.0)    maybe weekly (01/19/2013)   Heart murmur    High cholesterol    been off RX for this at one time (01/18/2013)   History of blood transfusion 1983; 04/2012   3 w/ childbirth; hospitalized for pain (01/19/2013)   Hypertension    Hypothyroidism    Migraines    twice/yr maybe (01/19/2013)   Obstructive sleep apnea 05/03/2012   OSA (obstructive sleep apnea)    sent me for test in 04/2012; never ordered mask, etc (01/19/2013)   PE (pulmonary thromboembolism) (HCC) 3 years ag0   3/notes 01/18/2013   Pneumonia  before 2011   once' (01/18/2013)   Renal disorder    kindey function low; Metformin was destroying my kidneys (01/19/2013)   Shortness of breath    only related to my CHF (01/18/2013)   Swelling of hand 08/31/2014   RT HAND   Type II diabetes mellitus (HCC)    UTI (urinary tract infection) 08/31/2014    SURGICAL HISTORY: Past Surgical History:  Procedure Laterality Date    CARDIAC CATHETERIZATION N/A 03/13/2015   Procedure: Right/Left Heart Cath and Coronary Angiography;  Surgeon: Gordy Bergamo, MD;  Location: [facility] INVASIVE CV LAB;  Service: Cardiovascular;  Laterality: N/A;   CATARACT EXTRACTION W/ INTRAOCULAR LENS  IMPLANT, BILATERAL Bilateral 2006-2011   CESAREAN SECTION  1983   CHOLECYSTECTOMY  ~ 2002   COLONOSCOPY N/A 01/21/2013   Procedure: COLONOSCOPY;  Surgeon: Belvie JONETTA Just, MD;  Location: [facility] ENDOSCOPY;  Service: Endoscopy;  Laterality: N/A;   EYE SURGERY Bilateral    multiple (01/18/2013)   INCISION AND DRAINAGE OF WOUND Right 12/01/2023   Procedure: IRRIGATION AND DEBRIDEMENT WOUND;  Surgeon: Desiderio Schanz, MD;  Location: [facility] ORS;  Service: General;  Laterality: Right;   IR TUNNELED CENTRAL VENOUS CATH PLC W IMG  12/21/2023   PACEMAKER IMPLANT N/A 08/31/2018   Symptomatic bradycardia due to mobitz II second degree AV block, permanent afib/ atypical atrial flutter implanted by Dr Kelsie PRESCOTT PLANA REPAIR OF RETINAL DEATACHMENT Right    PARS PLANA VITRECTOMY Bilateral 2004-2006   several (01/18/2013)   REFRACTIVE SURGERY Bilateral    for stigmatism (01/18/2013)   REFRACTIVE SURGERY Left ~ 11/2012   to puff it up cause vision got hazy (01/18/2013)   RIGHT HEART CATH N/A 12/30/2018   Procedure: RIGHT HEART CATH;  Surgeon: Cherrie Toribio SAUNDERS, MD;  Location: [facility] INVASIVE CV LAB;  Service: Cardiovascular;  Laterality: N/A;   RIGHT/LEFT HEART CATH AND CORONARY ANGIOGRAPHY N/A 09/07/2023   Procedure: RIGHT/LEFT HEART CATH AND CORONARY ANGIOGRAPHY;  Surgeon: Wendel Lurena POUR, MD;  Location: [facility] INVASIVE CV LAB;  Service: Cardiovascular;  Laterality: N/A;   VENA CAVA FILTER PLACEMENT  2011?    SOCIAL HISTORY: Social History   Socioeconomic History   Marital status: Single    Spouse name: Not on file   Number of children: Not on file   Years of education: Not on file   Highest education level: Not on file  Occupational History   Occupation: disabled   Tobacco Use   Smoking status: Former    Current packs/day: 0.00    Average packs/day: 0.1 packs/day for 30.0 years (1.5 ttl pk-yrs)    Types: Cigarettes    Start date: 18    Quit date: 76    Years since quitting: 35.7   Smokeless tobacco: Never   Tobacco comments:    01/19/2013 quit smoking cigarettes in the early '90's  Vaping Use   Vaping status: Never Used  Substance and Sexual Activity   Alcohol  use: Not Currently    Comment: rare now but never a heavy drinker   Drug use: No   Sexual activity: Never  Other Topics Concern   Not on file  Social History Narrative   Not on file   Social Drivers of Health   Financial Resource Strain: Not on file  Food Insecurity: No Food Insecurity (12/16/2023)   Hunger Vital Sign    Worried About Running Out of Food in the Last Year: Never true    Ran Out of Food in the Last Year: Never  true  Transportation Needs: No Transportation Needs (12/16/2023)   PRAPARE - Administrator, Civil Service (Medical): No    Lack of Transportation (Non-Medical): No  Physical Activity: Not on file  Stress: Not on file  Social Connections: Moderately Isolated (12/16/2023)   Social Connection and Isolation Panel    Frequency of Communication with Friends and Family: More than three times a week    Frequency of Social Gatherings with Friends and Family: Never    Attends Religious Services: More than 4 times per year    Active Member of [employer] or Organizations: No    Attends Banker Meetings: Never    Marital Status: Divorced  Catering manager Violence: Not At Risk (12/16/2023)   Humiliation, Afraid, Rape, and Kick questionnaire    Fear of Current or Ex-Partner: No    Emotionally Abused: No    Physically Abused: No    Sexually Abused: No    FAMILY HISTORY: Family History  Problem Relation Age of Onset   Cerebral aneurysm Mother    Hypertension Father    Cerebral aneurysm Maternal Grandfather    Cerebral aneurysm Maternal  Aunt    Cancer Maternal Uncle     ALLERGIES:  is allergic to ms contin [morphine].  MEDICATIONS:  Current Facility-Administered Medications  Medication Dose Route Frequency Provider Last Rate Last Admin   acetaminophen  (TYLENOL ) tablet 1,000 mg  1,000 mg Oral TID Georgina Basket, MD   1,000 mg at 12/24/23 0901   amLODipine  (NORVASC ) tablet 10 mg  10 mg Oral Daily Georgina Basket, MD   10 mg at 12/24/23 0901   apixaban  (ELIQUIS ) tablet 5 mg  5 mg Oral BID Paytes, Austin A, RPH   5 mg at 12/24/23 1309   ARIPiprazole  (ABILIFY ) tablet 2 mg  2 mg Oral QHS Georgina Basket, MD   2 mg at 12/23/23 2041   bisacodyl  (DULCOLAX) EC tablet 10 mg  10 mg Oral Q0600 Singh, Prashant K, MD       Chlorhexidine  Gluconate Cloth 2 % PADS 6 each  6 each Topical Q0600 Jerrye Katheryn BROCKS, MD   6 each at 12/24/23 0536   citalopram  (CELEXA ) tablet 20 mg  20 mg Oral QHS Singh, Prashant K, MD   20 mg at 12/23/23 2040   diazepam  (VALIUM ) tablet 5 mg  5 mg Oral BID PRN Georgina Basket, MD       docusate sodium  (COLACE) capsule 200 mg  200 mg Oral BID Singh, Prashant K, MD   200 mg at 12/21/23 2128   [START ON 12/25/2023] furosemide  (LASIX ) injection 80 mg  80 mg Intravenous Q M,W,F Jerrye Katheryn BROCKS, MD       HYDROmorphone  (DILAUDID ) injection 0.5 mg  0.5 mg Intravenous Q3H PRN Georgina Basket, MD   0.5 mg at 12/23/23 2039   insulin  aspart (novoLOG ) injection 0-15 Units  0-15 Units Subcutaneous TID WC Georgina Basket, MD   2 Units at 12/24/23 1300   [START ON 12/25/2023] insulin  glargine (LANTUS ) injection 15 Units  15 Units Subcutaneous Daily Elgergawy, Dawood S, MD       levothyroxine  (SYNTHROID ) tablet 100 mcg  100 mcg Oral QAC breakfast Georgina Basket, MD   100 mcg at 12/24/23 9388   melatonin tablet 10 mg  10 mg Oral QHS Georgina Basket, MD   10 mg at 12/23/23 2040   naloxegol  oxalate (MOVANTIK ) tablet 25 mg  25 mg Oral Daily Singh, Prashant K, MD   25 mg at 12/24/23 0901  ondansetron  (ZOFRAN ) injection 4 mg  4 mg Intravenous Q6H PRN Singh,  Prashant K, MD   4 mg at 12/22/23 1129   Oral care mouth rinse  15 mL Mouth Rinse PRN Singh, Prashant K, MD       polyethylene glycol (MIRALAX  / GLYCOLAX ) packet 17 g  17 g Oral BID Singh, Prashant K, MD   17 g at 12/20/23 9076   promethazine  (PHENERGAN ) 12.5 mg in sodium chloride  0.9 % 50 mL IVPB  12.5 mg Intravenous Q6H PRN Singh, Prashant K, MD   Stopped at 12/20/23 2358   rosuvastatin  (CRESTOR ) tablet 10 mg  10 mg Oral Daily Georgina Basket, MD   10 mg at 12/24/23 0901   senna-docusate (Senokot-S) tablet 2 tablet  2 tablet Oral QHS PRN Elgergawy, Brayton RAMAN, MD        REVIEW OF SYSTEMS:   Constitutional: Denies fevers, weight loss or abnormal night sweats Eyes: Denies visual change Ears, nose, mouth, throat, and face: Denies sore throat or enlarged tongue Respiratory: Denies cough, shortness of breath or wheezes Cardiovascular: Denies palpitation, chest discomfort or chest pain Gastrointestinal:  Denies nausea, vomiting, diarrhea, constipation, heartburn or abdominal pain GU: Denies any hesitancy, dysuria, frequency, hematuria Skin: Denies abnormal skin rashes Lymphatics: Denies new lymphadenopathy or mass Neurological: Denies numbness, tingling or new weaknesses All other systems were reviewed with the patient and are negative.  PHYSICAL EXAMINATION: ECOG PERFORMANCE STATUS: 4 - Bedbound  Vitals:   12/24/23 1745 12/24/23 1840  BP: (!) 139/58 (!) 146/70  Pulse: (!) 49 (!) 50  Resp: 18 20  Temp: 97.9 F (36.6 C)   SpO2: 100% 100%   Filed Weights   12/22/23 1342 12/22/23 1621 12/24/23 1745  Weight: (!) 316 lb 2.2 oz (143.4 kg) (!) 305 lb 5.4 oz (138.5 kg) 292 lb 1.8 oz (132.5 kg)    GENERAL:alert, no distress and comfortable SKIN: skin color normal. No jaundice EYES: normal, conjunctiva normal, sclera clear OROPHARYNX: no exudate, moist NECK: supple. No mass LYMPH:  no palpable cervical, axillary or inguinal lymphadenopathy LUNGS: clear to auscultation and normal breathing  effort.  No wheeze or rales HEART: regular rate & rhythm and no murmurs ABDOMEN:abdomen soft, non-tender and normal bowel sounds Musculoskeletal:  no lower extremity edema NEURO: alert & oriented x 3 with fluent speech; no focal motor/sensory deficits Strength and sensation equal bilaterally  LABORATORY DATA:  I have reviewed the data as listed Lab Results  Component Value Date   WBC 7.1 12/24/2023   HGB 8.7 (L) 12/24/2023   HCT 29.6 (L) 12/24/2023   MCV 103.1 (H) 12/24/2023   PLT 131 (L) 12/24/2023   Recent Labs    12/20/23 0704 12/21/23 0452 12/22/23 0324 12/23/23 0232 12/24/23 0637  NA 135 137 135 140 136  K 4.9 5.1 4.5 4.3 3.8  CL 100 100 100 99 96*  CO2 22 21* 23 23 28   GLUCOSE 118* 96 99 103* 85  BUN 143* 147* 121* 84* 90*  CREATININE 3.77* 4.07* 3.77* 3.08* 2.99*  CALCIUM  8.6* 8.6* 8.3* 8.6* 8.6*  GFRNONAA 13* 11* 13* 16* 17*  PROT 4.7* 5.4* 5.1*  --   --   ALBUMIN  3.1* 3.0* 2.7* 2.7* 2.7*  AST 26 27 22   --   --   ALT 17 18 14   --   --   ALKPHOS 69 71 70  --   --   BILITOT 0.6 0.6 0.4  --   --     RADIOGRAPHIC STUDIES: I  have personally reviewed the radiological images as listed and agreed with the findings in the report. US  BIOPSY (LIVER) Result Date: 12/22/2023 INDICATION: Liver lesions, concerning for metastatic disease, unknown primary EXAM: ULTRASOUND CORE BIOPSY ILL-DEFINED PERIPHERAL RIGHT LIVER LESION MEDICATIONS: 1% lidocaine  local ANESTHESIA/SEDATION: Moderate (conscious) sedation was employed during this procedure. A total of Versed  2.0 mg and Fentanyl  100 mcg was administered intravenously by the radiology nurse. Total intra-service moderate Sedation Time: 11 minutes. The patient's level of consciousness and vital signs were monitored continuously by radiology nursing throughout the procedure under my direct supervision. COMPLICATIONS: None immediate. PROCEDURE: Informed written consent was obtained from the patient after a thorough discussion of the  procedural risks, benefits and alternatives. All questions were addressed. Maximal Sterile Barrier Technique was utilized including caps, mask, sterile gowns, sterile gloves, sterile drape, hand hygiene and skin antiseptic. A timeout was performed prior to the initiation of the procedure. Previous imaging reviewed. Preliminary ultrasound performed. Exam is limited because of the patient's morbid obesity. Imaging the liver with ultrasound is challenging. An ill-defined hypoechoic area/mass in the right liver at the level of the portal vein was localized and marked for biopsy. This was correlated with the 12/16/2023 CT. Overlying skin marked. Under sterile conditions and local anesthesia, the 17 gauge 11.8 cm guide needle was advanced into the right peripheral hypoechoic mass. Needle position confirmed with ultrasound. 2 18 gauge core biopsies obtained. Samples were intact and non fragmented. These were placed in formalin. Needle tract occluded with Gel-Foam. Patient tolerated the procedure well. No immediate complication. IMPRESSION: Successful ultrasound ill-defined peripheral right liver subcapsular mass 18 gauge core biopsies. Electronically Signed   By: CHRISTELLA.  Shick M.D.   On: 12/22/2023 14:39   US  Abdomen Limited RUQ (LIVER/GB) Result Date: 12/21/2023 CLINICAL DATA:  Transaminitis EXAM: ULTRASOUND ABDOMEN LIMITED RIGHT UPPER QUADRANT COMPARISON:  CT scan 12/16/2023 FINDINGS: Gallbladder: Surgically absent Common bile duct: Diameter: 0.6 cm Liver: Abnormal nodular contour of the liver with heterogeneous liver echogenicity and poor sonic penetration. The hypoenhancing masses/lesions throughout the liver shown on 12/16/2023 not well seen, possibly due to lesser conspicuity on ultrasound compared to CT. Portal vein is patent on color Doppler imaging with normal direction of blood flow towards the liver. Other: Small right pleural effusion. IMPRESSION: 1. Abnormal nodular contour of the liver with heterogeneous liver  echogenicity and poor sonic penetration. The hypoenhancing masses/lesions throughout the liver shown on 12/16/2023 not well seen, possibly due to lesser conspicuity on ultrasound compared to CT. 2. Small right pleural effusion. 3. Prior cholecystectomy. Electronically Signed   By: Ryan Salvage M.D.   On: 12/21/2023 20:51   IR TUNNELED CENTRAL VENOUS CATH [facility] W IMG Result Date: 12/21/2023 CLINICAL DATA:  Renal failure and need for tunneled hemodialysis catheter. EXAM: TUNNELED CENTRAL VENOUS HEMODIALYSIS CATHETER PLACEMENT WITH ULTRASOUND AND FLUOROSCOPIC GUIDANCE ANESTHESIA/SEDATION: Moderate (conscious) sedation was employed during this procedure. A total of Versed  1.0 mg and Fentanyl  50 mcg was administered intravenously. Moderate Sedation Time: 28 minutes. The patient's level of consciousness and vital signs were monitored continuously by radiology nursing throughout the procedure under my direct supervision. MEDICATIONS: 1 g IV Ancef . FLUOROSCOPY: Radiation Exposure Index: 18.1 mGy Kerma PROCEDURE: The procedure, risks, benefits, and alternatives were explained to the patient. Questions regarding the procedure were encouraged and answered. The patient understands and consents to the procedure. A timeout was performed prior to initiating the procedure. The right neck and chest were prepped with chlorhexidine  in a sterile fashion, and a sterile drape was  applied covering the operative field. Maximum barrier sterile technique with sterile gowns and gloves were used for the procedure. Local anesthesia was provided with 1% lidocaine . Ultrasound was performed to confirm patency of the right internal jugular vein. An ultrasound image was saved and recorded. After creating a small venotomy incision, a 21 gauge needle was advanced into the right internal jugular vein under direct, real-time ultrasound guidance. After securing guidewire access, an 8 Fr dilator was placed. A J-wire was kinked to measure appropriate  catheter length. A Palindrome tunneled hemodialysis catheter measuring 23 cm from tip to cuff was chosen for placement. This was tunneled in a retrograde fashion from the chest wall to the venotomy incision. At the venotomy, serial dilatation was performed and a 15 Fr peel-away sheath was placed over a guidewire. The catheter was then placed through the sheath and the sheath removed. Final catheter positioning was confirmed and documented with a fluoroscopic spot image. The catheter was aspirated, flushed with saline, and injected with appropriate volume heparin  dwells. The venotomy incision was closed with subcutaneous and subcuticular 4-0 Vicryl. Dermabond was applied to the incision. The catheter exit site was secured with 0-Prolene retention sutures. COMPLICATIONS: None.  No pneumothorax. FINDINGS: After catheter placement, the tip lies in the right atrium. The catheter aspirates normally and is ready for immediate use. IMPRESSION: Placement of tunneled hemodialysis catheter via the right internal jugular vein. The catheter tip lies in the right atrium. The catheter is ready for immediate use. Electronically Signed   By: Marcey Moan M.D.   On: 12/21/2023 16:24   DG Abd 1 View Result Date: 12/20/2023 CLINICAL DATA:  68 year old female with constipation. EXAM: ABDOMEN - 1 VIEW COMPARISON:  CT Abdomen and Pelvis 12/16/2023 and earlier. FINDINGS: AP supine views at 0759 hours. Four views. Non obstructed bowel gas pattern. IVC filter redemonstrated. Stable cholecystectomy clips. Moderate volume retained rectosigmoid colon stool. Mild large bowel retained stool elsewhere. Stable visualized osseous structures. Advanced lower lumbar disc and endplate degeneration. Grossly negative lung bases, cardiac pacemaker lead redemonstrated. IMPRESSION: Non obstructed bowel gas pattern with moderate volume retained stool, primarily in the rectosigmoid. Electronically Signed   By: VEAR Hurst M.D.   On: 12/20/2023 08:19   CT  LIVER ABDOMEN W WO CONTRAST Result Date: 12/16/2023 EXAM: CT ABDOMEN WITH AND WITHOUT CONTRAST 12/16/2023 08:01:00 PM TECHNIQUE: CT of the abdomen was performed with the administration of intravenous contrast. Multiplanar reformatted images are provided for review. Automated exposure control, iterative reconstruction, and/or weight based adjustment of the mA/kV was utilized to reduce the radiation dose to as low as reasonably achievable. COMPARISON: CT 12/15/2023 CLINICAL HISTORY: Liver lesion, > 1cm. FINDINGS: LOWER CHEST: Small right pleural effusion and pneumonia or atelectasis. HEPATOBILIARY: Redemonstrated hypoattenuating lesions throughout the liver with peripheral enhancement on the portal venous phase in the right base. The largest lesion is in the inferior right hepatic lobe measuring 7.9 cm (series 8 image 92). Differential considerations include metastases or hepatic abscesses. Cholecystectomy. SPLEEN: Spleen demonstrates no acute abnormality. PANCREAS: Pancreas demonstrates no acute abnormality. ADRENAL GLANDS: Adrenal glands demonstrate no acute abnormality. KIDNEYS: Nonobstructing left nephrolithiasis. No hydronephrosis. No perinephric or periureteral stranding. GI AND BOWEL: Stomach and duodenal sweep demonstrate no acute abnormality. There is no bowel obstruction. No abnormal bowel wall thickening or distension. PERITONEUM AND RETROPERITONEUM: No ascites or free air. Aorta is normal in caliber. IVC filter. LYMPH NODES: No lymphadenopathy. BONES AND SOFT TISSUES: No acute abnormality of the visualized bones. Body wall edema. IMPRESSION: 1. Hypoattenuating  liver lesions with peripheral enhancement, largest measuring 7.9 cm in the inferior right hepatic lobe concerning for metastases. Differential consideration includes hepatic abscesses. Hepatic protocol MRI with and without IV contrast is recommended. 2. Small right pleural effusion and RLL pneumonia or atelectasis. Electronically signed by: Norman Gatlin MD 12/16/2023 08:53 PM EDT RP Workstation: [id number]   CT Renal Stone Study Result Date: 12/15/2023 EXAM: CT UROGRAM 12/15/2023 11:01:37 PM TECHNIQUE: CT of the abdomen and pelvis was performed before and after the administration of intravenous contrast as per CT urogram protocol. Multiplanar reformatted images as well as MIP urogram images are provided for review. Automated exposure control, iterative reconstruction, and/or weight based adjustment of the mA/kV was utilized to reduce the radiation dose to as low as reasonably achievable. COMPARISON: CT 12/25/2012 CLINICAL HISTORY: Abdominal/flank pain, stone suspected. FINDINGS: LOWER CHEST: Small right pleural effusions. Right lower lobe airspace opacity suspicious for pneumonia. LIVER: Multiple hypoattenuating lesions throughout the liver were not previously seen and are concerning for metastases. These are incompletely assessed without IV contrast. The largest measures approximately 7.7 cm in the inferior right hepatic lobe (series 3, image 36). GALLBLADDER AND BILE DUCTS: Cholecystectomy. SPLEEN: No acute abnormality. PANCREAS: No acute abnormality. ADRENAL GLANDS: No acute abnormality. KIDNEYS, URETERS AND BLADDER: Nonobstructing left nephrolithiasis. No hydronephrosis. No perinephric or periureteral stranding. Urinary bladder is unremarkable. GI AND BOWEL: Stomach demonstrates no acute abnormality. There is no bowel obstruction. PERITONEUM AND RETROPERITONEUM: No ascites. No free air. VASCULATURE: Similar configuration of the IVC filter. Aortic atherosclerotic calcification. LYMPH NODES: No lymphadenopathy. REPRODUCTIVE ORGANS: No acute abnormality. BONES AND SOFT TISSUES: No acute osseous abnormality. Diffuse edema in the bilateral flanks and anterior abdominal wall pannus. IMPRESSION: 1. Multiple hypoattenuating liver lesions concerning for metastases, incompletely assessed without IV contrast. Largest measures approximately 7.7 cm in the  inferior right hepatic lobe. Further evaluation with CT with IV contrast is recommended. 2. Right lower lobe airspace opacity suspicious for pneumonia. 3. Nonobstructing left nephrolithiasis. No hydronephrosis. Electronically signed by: Norman Gatlin MD 12/15/2023 11:10 PM EDT RP Workstation: [id number]   DG Chest 2 View Result Date: 12/15/2023 CLINICAL DATA:  Chest pain. EXAM: CHEST - 2 VIEW COMPARISON:  Chest radiograph dated 11/11/2023 FINDINGS: There is cardiomegaly with vascular congestion. No focal consolidation, pleural effusion or pneumothorax. Left pectoral pacemaker device. Atherosclerotic calcification of the aorta. No acute osseous pathology. IMPRESSION: Cardiomegaly with vascular congestion.  Pneumonia is not excluded. Electronically Signed   By: Vanetta Chou M.D.   On: 12/15/2023 21:38   US  RT UPPER EXTREM LTD SOFT TISSUE NON VASCULAR Result Date: 11/30/2023 CLINICAL DATA:  Abscess. Axillary abscess and status post bedside I and D. Evaluate for a persistent abscess. EXAM: ULTRASOUND RIGHT UPPER EXTREMITY LIMITED TECHNIQUE: Ultrasound examination of the upper extremity soft tissues was performed in the area of clinical concern. COMPARISON:  None Available. FINDINGS: Poorly defined heterogeneous hypoechoic area at the area of concern in the right axilla. This area roughly measures 1.9 x 1.0 x 1.6 cm. IMPRESSION: Poorly defined superficial heterogeneous area at the area of concern in the right axilla. This area measures up to 1.9 cm. This could represent a small abscess or phlegmon. Electronically Signed   By: Juliene Balder M.D.   On: 11/30/2023 16:29

## 2023-12-24 NOTE — Progress Notes (Signed)
 [facility] NEPHROLOGY PROGRESS NOTE  Assessment/ Plan: Pt is a 68 y.o. 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   # Acute kidney injury on CKD stage IV likely due to reduced effective renal perfusion caused by CHF/infection/recent prolonged hospitalization versus progressive CKD.   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 Dennise at [facility], last seen in 06/2023.  Since then patient had hospitalization at [facility] related with abscess, CHF, AKI on CKD.  Now presents with fluid overload in the setting of withholding diuretics and marginal UOP with IV diuretics.  Unfortunately showing signs of uremia including nausea and generalized weakness.  Started HD on 9/15 after tunneled catheter with IR ------------ - Next HD on 9/18, Thursday and for now per a TTS schedule - She has been accepted to [facility] on a TTS schedule (as an AKI however note that I am concerned that she may progress to ESRD) - Follow her trends and assess for renal recovery.  Note multiple prior admissions with overload - she may have had CKD progression    # Chronic hypoxic respiratory failure due to pneumonia, CHF  - on lasix  80 mg IV BID currently - optimizing volume status with HD   # CHF exacerbation with preserved EF: Recent echo reviewed with EF 60 to 65%.  Cath with nonobstructive CAD. - optimize volume status with HD    # Liver lesions consistent with metastasis:  - IR consulted for biopsy which occurred on 9/16 - per primary team   # Anemia: Iron  saturation 9% with low serum iron , she has no bacteremia.   - s/p IV iron  - we will hold ESA while undergoing  evaluation for liver mass.  # CKD stage IV -CKD thought to be due to diabetic nephropathy and was followed by Dr. Dennise at [facility], last seen in 06/2023. Then multiple readmissions in the interim as above  Disposition - continue inpatient monitoring     Subjective:  She had no urine output over 9/17.  She states that she has a foley and this was emptied multiple times.  Last HD on 9/16 with 1.5 kg UF.  She and I discussed that she had been accepted to [facility] on TTS schedule.  She is going to SNF.    Seen and examined on dialysis.  Procedure supervised.  Blood pressure 148/55 and HR 50.  Tolerating goal.  RIJ tunneled catheter in use.      Review of systems:    States that her shortness of breath is better than prior Denies n/v Denies chest pain     Objective Vital signs in last 24 hours: Vitals:   12/24/23 0416 12/24/23 0515 12/24/23 0741 12/24/23 1158  BP: (!) 157/60  (!) 156/61 (!) 152/54  Pulse: (!) 51 (!) 50 (!) 51 (!) 50  Resp: 14 16 15 19   Temp:  98.4 F (36.9 C) 98.5 F (36.9 C) 97.7 F (36.5 C)  TempSrc:  Oral Oral Oral  SpO2: 98% 100% 100% 100%  Weight:      Height:       Weight change:  No intake or output data in the 24 hours ending  12/24/23 1420      Labs: RENAL PANEL Recent Labs  Lab 12/18/23 0248 12/19/23 0227 12/20/23 0704 12/21/23 0452 12/22/23 0324 12/23/23 0232 12/24/23 0637  NA 135 135 135 137 135 140 136  K 4.8 4.5 4.9 5.1 4.5 4.3 3.8  CL 100 100 100 100 100 99 96*  CO2 23 24 22  21* 23 23 28   GLUCOSE 125* 95 118* 96 99 103* 85  BUN 138* 140* 143* 147* 121* 84* 90*  CREATININE 3.46* 3.55* 3.77* 4.07* 3.77* 3.08* 2.99*  CALCIUM  8.4* 8.4* 8.6* 8.6* 8.3* 8.6* 8.6*  MG 2.7* 2.8* 2.8* 2.9*  --   --   --   PHOS  --   --   --   --   --  5.7* 5.6*  ALBUMIN  2.6* 2.9* 3.1* 3.0* 2.7* 2.7* 2.7*    Liver Function Tests: Recent Labs  Lab 12/20/23 0704 12/21/23 0452 12/22/23 0324 12/23/23 0232 12/24/23 0637  AST 26 27 22   --   --   ALT 17 18  14   --   --   ALKPHOS 69 71 70  --   --   BILITOT 0.6 0.6 0.4  --   --   PROT 4.7* 5.4* 5.1*  --   --   ALBUMIN  3.1* 3.0* 2.7* 2.7* 2.7*   No results for input(s): LIPASE, AMYLASE in the last 168 hours. No results for input(s): AMMONIA in the last 168 hours. CBC: Recent Labs    11/17/23 1113 11/18/23 1050 12/17/23 0209 12/18/23 0248 12/20/23 0704 12/21/23 0452 12/22/23 0324 12/23/23 0232 12/24/23 0637  HGB  --    < >  --    < > 8.3* 8.4* 8.2* 9.1* 8.7*  MCV  --    < >  --    < > 101.8* 102.2* 102.2* 103.1* 103.1*  VITAMINB12 480  --   --   --   --   --   --   --   --   FERRITIN  --   --  115  --   --   --   --   --   --   TIBC 245*  --  245*  --   --   --   --   --   --   IRON  33  --  22*  --   --   --   --   --   --    < > = values in this interval not displayed.    Cardiac Enzymes: No results for input(s): CKTOTAL, CKMB, CKMBINDEX, TROPONINI in the last 168 hours. CBG: Recent Labs  Lab 12/23/23 1659 12/23/23 1947 12/24/23 0814 12/24/23 0827 12/24/23 1155  GLUCAP 141* 149* 103* 130* 144*    Iron  Studies:  No results for input(s): IRON , TIBC, TRANSFERRIN, FERRITIN in the last 72 hours.  Studies/Results: No results found.    Medications: Infusions:  promethazine  (PHENERGAN ) injection (IM or IVPB) Stopped (12/20/23 2358)    Scheduled Medications:  acetaminophen   1,000 mg Oral TID   amLODipine   10 mg Oral Daily   apixaban   5 mg Oral BID   ARIPiprazole   2 mg Oral QHS   bisacodyl   10 mg Oral Q0600   Chlorhexidine  Gluconate Cloth  6 each Topical Q0600   citalopram   20 mg Oral QHS   docusate sodium   200 mg Oral BID   furosemide   80 mg Intravenous BID   insulin  aspart  0-15 Units Subcutaneous TID WC   [  START ON 12/25/2023] insulin  glargine  15 Units Subcutaneous Daily   levothyroxine   100 mcg Oral QAC breakfast   melatonin  10 mg Oral QHS   naloxegol  oxalate  25 mg Oral Daily   polyethylene glycol  17 g Oral BID   rosuvastatin   10 mg  Oral Daily    have reviewed scheduled and prn medications.  Physical Exam:       General adult female in bed in no acute distress HEENT normocephalic atraumatic extraocular movements intact sclera anicteric Neck supple trachea midline Lungs clear but reduced to auscultation bilaterally normal work of breathing at rest on nasal cannula 3 liters Heart S1S2 no rub Abdomen soft nontender obese habitus Extremities diffuse edema; legs wrapped  Psych normal mood and affect Neuro alert and oriented x 3 provides  Access RIJ tunn catheter    Katheryn JAYSON Saba 12/24/2023,2:40 PM  LOS: 7 days

## 2023-12-24 NOTE — Progress Notes (Signed)
 PROGRESS NOTE                                                                                                                                                                                                             Patient Demographics:    [name], is a 68 y.o. female, DOB - 04/21/1955, FMW:[id number]  Outpatient Primary MD for the patient is Valma Carwin, MD    LOS - 7  Admit date - 12/15/2023    Chief Complaint  Patient presents with   Chest Pain       Brief Narrative (HPI from H&P)       68 y.o. female long-term resident at SNF with medical history significant of pulmonary hypertension, chronic respiratory failure requiring 2L [place] at baseline, Afib on Eliquis , HFpEF (EF 60-65% in 08/2023), DM2 (A1c 5.7 in 08/2023), DVT/PE x3 s/p IVC filter in 09/2009, CKD stage IV, and morbid obesity p/w SOB/cp iso volume overload 2/2 LLL CAP and HFpEF exacerbation c/b worsening renal failure and found to have liver lesions c/f metastases.    Subjective:   Patient in bed, comfortable, no nausea, no vomiting, no headache .  No further diarrhea.   Assessment  & Plan :   Dyspnea/chronic hypoxic respiratory failure - Multifactorial, due to CHF and pneumonia, and pulmonary hypertension  Acute on chronic diastolic CHF  -Volume management with dialysis -Making urine, remains on IV diuresis HFpEF EF 60% on recent echocardiogram recent [facility] noted no significant CAD, PA pressure 62/16, wedge of 11, PVR 4.4, group 3 pulmonary hypertension suspected, Diuresis per nephrology.  Left lower lobe community-acquired pneumonia -Continue with antibiotics -Encouraged use incentive spirometry and flutter valve    Citrobacter UTI - receved 3 days of IV cefepime   H/o pulmonary HTN (group 3 suspected) H/o morbid obesity supportive care.   -Follow-up with PCP for weight loss.  BMI of 57   Liver lesions c/f metastases   -Status post  ultrasound-guided biopsy by IR 9/16.  Awaiting results. -Does have very high CA 19.9 and CEA levels as well.  Postbiopsy results will involve GI and oncology as appropriate.   Paroxysmal Afib   -anticoagulation currently heparin  drip post biopsy will resume once cleared by IR, long-term on Eliquis .,  Resume on Eliquis .   Hypothyroid  -PTA Synthroid    HTN  -PTA amlodipine    Constipation  with nausea.  Last BM per patient now over 4 weeks ago, on [place] bowel regimen including Colace, MiraLAX , Movantik , Dulcolax, lactulose .  Also received enema on 12/21/2023  Chronic lymphedema - Venous stasis ulcer of LLE  -Continue wrapping BLE and Unna boots to BLE as well   MDD  -PTA Abilify , Celexa , and Valium   DM2 -PTA long acting insulin  20U daily and SSI TID AC prn   Lab Results  Component Value Date   HGBA1C 5.7 (H) 09/04/2023   .CBG (last 3)  Recent Labs    12/23/23 1947 12/24/23 0814 12/24/23 0827  GLUCAP 149* 103* 130*          Condition - Extremely Guarded  Family Communication  :  none  Code Status :  Full  Consults  :  Renal, IR  PUD Prophylaxis :     Procedures  :     CT-guided liver biopsy due now on 12/22/2023.    Right IJ HD catheter placed by IR on 12/21/2023.    CT Liver - 1. Hypoattenuating liver lesions with peripheral enhancement, largest measuring 7.9 cm in the inferior right hepatic lobe concerning for metastases. Differential consideration includes hepatic abscesses. Hepatic protocol MRI with and without IV contrast is recommended. 2. Small right pleural effusion and RLL pneumonia or atelectasis.      Disposition Plan  :    Status is: Inpatient   DVT Prophylaxis  :  Hep gtt, will resume on Eliquis .   Lab Results  Component Value Date   PLT 131 (L) 12/24/2023    Diet :  Diet Order             Diet heart healthy/carb modified Room service appropriate? Yes; Fluid consistency: Thin  Diet effective now                    Inpatient  Medications  Scheduled Meds:  acetaminophen   1,000 mg Oral TID   amLODipine   10 mg Oral Daily   ARIPiprazole   2 mg Oral QHS   bisacodyl   10 mg Oral Q0600   Chlorhexidine  Gluconate Cloth  6 each Topical Q0600   citalopram   20 mg Oral QHS   docusate sodium   200 mg Oral BID   furosemide   80 mg Intravenous BID   insulin  aspart  0-15 Units Subcutaneous TID WC   [START ON 12/25/2023] insulin  glargine  15 Units Subcutaneous Daily   levothyroxine   100 mcg Oral QAC breakfast   melatonin  10 mg Oral QHS   naloxegol  oxalate  25 mg Oral Daily   polyethylene glycol  17 g Oral BID   rosuvastatin   10 mg Oral Daily   Continuous Infusions:  heparin  1,300 Units/hr (12/23/23 2217)   promethazine  (PHENERGAN ) injection (IM or IVPB) Stopped (12/20/23 2358)   PRN Meds:.diazepam , HYDROmorphone  (DILAUDID ) injection, ondansetron  (ZOFRAN ) IV, mouth rinse, promethazine  (PHENERGAN ) injection (IM or IVPB), senna-docusate    Objective:   Vitals:   12/23/23 1206 12/24/23 0416 12/24/23 0515 12/24/23 0741  BP:  (!) 157/60  (!) 156/61  Pulse:  (!) 51 (!) 50 (!) 51  Resp:  14 16 15   Temp:   98.4 F (36.9 C) 98.5 F (36.9 C)  TempSrc: Oral  Oral Oral  SpO2:  98% 100% 100%  Weight:      Height:        Wt Readings from Last 3 Encounters:  12/22/23 (!) 138.5 kg  12/05/23 (!) 137.2 kg  11/05/23 136.1 kg    No  intake or output data in the 24 hours ending 12/24/23 1148    Physical Exam  Awake Alert, Oriented X 3, with morbid obesity Symmetrical Chest wall movement, Good air movement bilaterally RRR,No Gallops,Rubs or new Murmurs, No Parasternal Heave +ve B.Sounds, Abd Soft, No tenderness, No rebound - guarding or rigidity. No Cyanosis, bilateral Unna boots     Data Review:    Recent Labs  Lab 12/18/23 0248 12/19/23 0227 12/20/23 0704 12/21/23 0452 12/22/23 0324 12/23/23 0232 12/24/23 0637  WBC 6.0 5.6 6.8 8.5 7.7 7.6 7.1  HGB 7.7* 7.6* 8.3* 8.4* 8.2* 9.1* 8.7*  HCT 25.6* 26.0* 27.7*  27.9* 27.8* 30.2* 29.6*  PLT 133* 146* 163 150 129* 142* 131*  MCV 102.0* 102.8* 101.8* 102.2* 102.2* 103.1* 103.1*  MCH 30.7 30.0 30.5 30.8 30.1 31.1 30.3  MCHC 30.1 29.2* 30.0 30.1 29.5* 30.1 29.4*  RDW 16.7* 16.9* 16.9* 16.9* 17.0* 17.1* 17.0*  LYMPHSABS 0.5* 0.4* 0.5* 0.5* 0.3*  --   --   MONOABS 0.6 0.7 0.8 0.8 0.6  --   --   EOSABS 0.2 0.2 0.1 0.2 0.1  --   --   BASOSABS 0.0 0.0 0.0 0.0 0.0  --   --     Recent Labs  Lab 12/18/23 0248 12/19/23 0227 12/19/23 0952 12/20/23 0704 12/20/23 2345 12/21/23 0452 12/22/23 0324 12/23/23 0232 12/24/23 0637  NA 135 135  --  135  --  137 135 140 136  K 4.8 4.5  --  4.9  --  5.1 4.5 4.3 3.8  CL 100 100  --  100  --  100 100 99 96*  CO2 23 24  --  22  --  21* 23 23 28   ANIONGAP 12 11  --  13  --  16* 12 18* 12  GLUCOSE 125* 95  --  118*  --  96 99 103* 85  BUN 138* 140*  --  143*  --  147* 121* 84* 90*  CREATININE 3.46* 3.55*  --  3.77*  --  4.07* 3.77* 3.08* 2.99*  AST 17 18  --  26  --  27 22  --   --   ALT 14 14  --  17  --  18 14  --   --   ALKPHOS 63 60  --  69  --  71 70  --   --   BILITOT 0.6 0.5  --  0.6  --  0.6 0.4  --   --   ALBUMIN  2.6* 2.9*  --  3.1*  --  3.0* 2.7* 2.7* 2.7*  INR  --   --  1.6*  --  1.5*  --   --   --   --   MG 2.7* 2.8*  --  2.8*  --  2.9*  --   --   --   PHOS  --   --   --   --   --   --   --  5.7* 5.6*  CALCIUM  8.4* 8.4*  --  8.6*  --  8.6* 8.3* 8.6* 8.6*      Recent Labs  Lab 12/18/23 0248 12/19/23 0227 12/19/23 0952 12/20/23 0704 12/20/23 2345 12/21/23 0452 12/22/23 0324 12/23/23 0232 12/24/23 0637  INR  --   --  1.6*  --  1.5*  --   --   --   --   MG 2.7* 2.8*  --  2.8*  --  2.9*  --   --   --  CALCIUM  8.4* 8.4*  --  8.6*  --  8.6* 8.3* 8.6* 8.6*    --------------------------------------------------------------------------------------------------------------- Lab Results  Component Value Date   CHOL 105 09/03/2023   HDL 50 09/03/2023   LDLCALC 44 09/03/2023   TRIG 56  09/03/2023   CHOLHDL 2.1 09/03/2023    Lab Results  Component Value Date   HGBA1C 5.7 (H) 09/04/2023   No results for input(s): TSH, T4TOTAL, FREET4, T3FREE, THYROIDAB in the last 72 hours. No results for input(s): VITAMINB12, FOLATE, FERRITIN, TIBC, IRON , RETICCTPCT in the last 72 hours.    Micro Results Recent Results (from the past 240 hours)  Urine Culture     Status: Abnormal   Collection Time: 12/16/23 12:13 AM   Specimen: Urine, Clean Catch  Result Value Ref Range Status   Specimen Description URINE, CLEAN CATCH  Final   Special Requests   Final    NONE Performed at [facility], [address], [place], [place] [address]    Culture 80,000 COLONIES/mL CITROBACTER FREUNDII (A)  Final   Report Status 12/19/2023 FINAL  Final   Organism ID, Bacteria CITROBACTER FREUNDII (A)  Final      Susceptibility   Citrobacter freundii - MIC*    CEFEPIME  <=0.12 SENSITIVE Sensitive     ERTAPENEM <=0.12 SENSITIVE Sensitive     CEFTRIAXONE  32 RESISTANT Resistant     CIPROFLOXACIN  <=0.06 SENSITIVE Sensitive     GENTAMICIN  <=1 SENSITIVE Sensitive     NITROFURANTOIN <=16 SENSITIVE Sensitive     TRIMETH /SULFA  >=320 RESISTANT Resistant     PIP/TAZO Value in next row Sensitive ug/mL     <=4 SENSITIVEThis is a modified FDA-approved test that has been validated and its performance characteristics determined by the reporting laboratory.  This laboratory is certified under the Clinical Laboratory Improvement Amendments CLIA as qualified to perform high complexity clinical laboratory testing.    MEROPENEM Value in next row Sensitive      <=4 SENSITIVEThis is a modified FDA-approved test that has been validated and its performance characteristics determined by the reporting laboratory.  This laboratory is certified under the Clinical Laboratory Improvement Amendments CLIA as qualified to perform high complexity clinical laboratory testing.    * 80,000 COLONIES/mL CITROBACTER  FREUNDII  Blood culture (routine x 2)     Status: None   Collection Time: 12/16/23 12:13 AM   Specimen: BLOOD  Result Value Ref Range Status   Specimen Description BLOOD SITE NOT SPECIFIED  Final   Special Requests   Final    BOTTLES DRAWN AEROBIC AND ANAEROBIC Blood Culture results may not be optimal due to an inadequate volume of blood received in culture bottles   Culture   Final    NO GROWTH 5 DAYS Performed at [facility], [address] [place], [place] [address]    Report Status 12/21/2023 FINAL  Final  Blood culture (routine x 2)     Status: None   Collection Time: 12/16/23 12:13 AM   Specimen: BLOOD  Result Value Ref Range Status   Specimen Description BLOOD SITE NOT SPECIFIED  Final   Special Requests   Final    BOTTLES DRAWN AEROBIC AND ANAEROBIC Blood Culture adequate volume   Culture   Final    NO GROWTH 5 DAYS Performed at [facility], [address], [place], [place] [address]    Report Status 12/21/2023 FINAL  Final    Radiology Report US  BIOPSY (LIVER) Result Date: 12/22/2023 INDICATION: Liver lesions, concerning  for metastatic disease, unknown primary EXAM: ULTRASOUND CORE BIOPSY ILL-DEFINED PERIPHERAL RIGHT LIVER LESION MEDICATIONS: 1% lidocaine  local ANESTHESIA/SEDATION: Moderate (conscious) sedation was employed during this procedure. A total of Versed  2.0 mg and Fentanyl  100 mcg was administered intravenously by the radiology nurse. Total intra-service moderate Sedation Time: 11 minutes. The patient's level of consciousness and vital signs were monitored continuously by radiology nursing throughout the procedure under my direct supervision. COMPLICATIONS: None immediate. PROCEDURE: Informed written consent was obtained from the patient after a thorough discussion of the procedural risks, benefits and alternatives. All questions were addressed. Maximal Sterile Barrier Technique was utilized including caps, mask, sterile gowns, sterile gloves, sterile  drape, hand hygiene and skin antiseptic. A timeout was performed prior to the initiation of the procedure. Previous imaging reviewed. Preliminary ultrasound performed. Exam is limited because of the patient's morbid obesity. Imaging the liver with ultrasound is challenging. An ill-defined hypoechoic area/mass in the right liver at the level of the portal vein was localized and marked for biopsy. This was correlated with the 12/16/2023 CT. Overlying skin marked. Under sterile conditions and local anesthesia, the 17 gauge 11.8 cm guide needle was advanced into the right peripheral hypoechoic mass. Needle position confirmed with ultrasound. 2 18 gauge core biopsies obtained. Samples were intact and non fragmented. These were placed in formalin. Needle tract occluded with Gel-Foam. Patient tolerated the procedure well. No immediate complication. IMPRESSION: Successful ultrasound ill-defined peripheral right liver subcapsular mass 18 gauge core biopsies. Electronically Signed   By: CHRISTELLA.  Shick M.D.   On: 12/22/2023 14:39      Signature  -   Brayton Adrain Butrick M.D on 12/24/2023 at 11:48 AM   -  To page go to [email or web address]

## 2023-12-24 NOTE — Progress Notes (Addendum)
 Contacted [facility] admissions and local clinic requesting an update on pt's acceptance/schedule. Awaiting response. Will assist as needed.   Randine Mungo Dialysis Navigator [phone]  Addendum at 1:40 pm: Pt has been accepted at [facility] on TTS 12:35 pm chair time. Pt will need to arrive at 11:45 am for first appt. Clinic reports pt can start on Tuesday. Update provided to attending, nephrologist, and CSW. Navigator inquired if CSW could contact snf to confirm snf can accommodate clinic and schedule.

## 2023-12-25 ENCOUNTER — Encounter: Admitting: Surgery

## 2023-12-25 DIAGNOSIS — K769 Liver disease, unspecified: Secondary | ICD-10-CM | POA: Diagnosis not present

## 2023-12-25 DIAGNOSIS — Z515 Encounter for palliative care: Secondary | ICD-10-CM

## 2023-12-25 DIAGNOSIS — N179 Acute kidney failure, unspecified: Secondary | ICD-10-CM | POA: Diagnosis not present

## 2023-12-25 DIAGNOSIS — J189 Pneumonia, unspecified organism: Secondary | ICD-10-CM | POA: Diagnosis not present

## 2023-12-25 LAB — CBC
HCT: 30.7 % — ABNORMAL LOW (ref 36.0–46.0)
Hemoglobin: 9.2 g/dL — ABNORMAL LOW (ref 12.0–15.0)
MCH: 31 pg (ref 26.0–34.0)
MCHC: 30 g/dL (ref 30.0–36.0)
MCV: 103.4 fL — ABNORMAL HIGH (ref 80.0–100.0)
Platelets: 133 K/uL — ABNORMAL LOW (ref 150–400)
RBC: 2.97 MIL/uL — ABNORMAL LOW (ref 3.87–5.11)
RDW: 17 % — ABNORMAL HIGH (ref 11.5–15.5)
WBC: 8 K/uL (ref 4.0–10.5)
nRBC: 0 % (ref 0.0–0.2)

## 2023-12-25 LAB — RENAL FUNCTION PANEL
Albumin: 2.8 g/dL — ABNORMAL LOW (ref 3.5–5.0)
Anion gap: 16 — ABNORMAL HIGH (ref 5–15)
BUN: 60 mg/dL — ABNORMAL HIGH (ref 8–23)
CO2: 24 mmol/L (ref 22–32)
Calcium: 8.6 mg/dL — ABNORMAL LOW (ref 8.9–10.3)
Chloride: 98 mmol/L (ref 98–111)
Creatinine, Ser: 2.25 mg/dL — ABNORMAL HIGH (ref 0.44–1.00)
GFR, Estimated: 23 mL/min — ABNORMAL LOW (ref >60)
Glucose, Bld: 110 mg/dL — ABNORMAL HIGH (ref 70–99)
Phosphorus: 4.8 mg/dL — ABNORMAL HIGH (ref 2.5–4.6)
Potassium: 3.9 mmol/L (ref 3.5–5.1)
Sodium: 138 mmol/L (ref 135–145)

## 2023-12-25 LAB — GLUCOSE, CAPILLARY
Glucose-Capillary: 134 mg/dL — ABNORMAL HIGH (ref 70–99)
Glucose-Capillary: 113 mg/dL — ABNORMAL HIGH (ref 70–99)

## 2023-12-25 MED ORDER — PROCHLORPERAZINE EDISYLATE 10 MG/2ML IJ SOLN: 10.0000 mg | Freq: Four times a day (QID) | INTRAMUSCULAR | Status: DC | PRN

## 2023-12-25 MED ORDER — GLYCOPYRROLATE 0.2 MG/ML IJ SOLN: 0.2000 mg | INTRAMUSCULAR | Status: DC | PRN

## 2023-12-25 MED ORDER — LORAZEPAM 1 MG PO TABS: 1.0000 mg | ORAL_TABLET | ORAL | Status: DC | PRN

## 2023-12-25 MED ORDER — HALOPERIDOL 0.5 MG PO TABS: 0.5000 mg | ORAL_TABLET | ORAL | Status: DC | PRN

## 2023-12-25 MED ORDER — BISACODYL 10 MG RE SUPP: 10.0000 mg | Freq: Every day | RECTAL | Status: DC | PRN

## 2023-12-25 MED ORDER — POLYVINYL ALCOHOL 1.4 % OP SOLN: 1.0000 [drp] | Freq: Four times a day (QID) | OPHTHALMIC | Status: DC | PRN

## 2023-12-25 MED ORDER — LORAZEPAM 2 MG/ML IJ SOLN: 1.0000 mg | INTRAMUSCULAR | Status: DC | PRN

## 2023-12-25 MED ORDER — DIPHENHYDRAMINE HCL 50 MG/ML IJ SOLN: 12.5000 mg | INTRAMUSCULAR | Status: DC | PRN

## 2023-12-25 MED ORDER — GLYCOPYRROLATE 0.2 MG/ML IJ SOLN
0.2000 mg | INTRAMUSCULAR | Status: DC | PRN
  Administered 2023-12-30 – 2023-12-31 (×2): 0.2 mg via INTRAVENOUS
  Filled 2023-12-25 (×2): qty 1

## 2023-12-25 MED ORDER — HALOPERIDOL LACTATE 2 MG/ML PO CONC: 0.5000 mg | Freq: Four times a day (QID) | ORAL | Status: DC | PRN

## 2023-12-25 MED ORDER — HYDROMORPHONE HCL 1 MG/ML IJ SOLN
0.5000 mg | INTRAMUSCULAR | Status: DC | PRN
  Administered 2023-12-25: 1 mg via INTRAVENOUS
  Administered 2023-12-26: 2 mg via INTRAVENOUS
  Administered 2023-12-26: 1 mg via INTRAVENOUS
  Administered 2023-12-27: 2 mg via INTRAVENOUS
  Administered 2023-12-27: 1 mg via INTRAVENOUS
  Administered 2023-12-28 – 2023-12-30 (×6): 2 mg via INTRAVENOUS
  Filled 2023-12-25 (×2): qty 2
  Filled 2023-12-25: qty 1
  Filled 2023-12-25: qty 2
  Filled 2023-12-25: qty 1
  Filled 2023-12-25 (×6): qty 2

## 2023-12-25 MED ORDER — HALOPERIDOL LACTATE 5 MG/ML IJ SOLN: 0.5000 mg | INTRAMUSCULAR | Status: DC | PRN

## 2023-12-25 MED ORDER — GLYCOPYRROLATE 1 MG PO TABS: 1.0000 mg | ORAL_TABLET | ORAL | Status: DC | PRN

## 2023-12-25 MED ORDER — LORAZEPAM 2 MG/ML PO CONC: 1.0000 mg | ORAL | Status: DC | PRN

## 2023-12-25 MED ORDER — BIOTENE DRY MOUTH MT LIQD: 15.0000 mL | OROMUCOSAL | Status: DC | PRN

## 2023-12-25 NOTE — Plan of Care (Signed)
  Problem: Education: Goal: Ability to describe self-care measures that may prevent or decrease complications (Diabetes Survival Skills Education) will improve Outcome: Progressing Goal: Individualized Educational Video(s) Outcome: Progressing   Problem: Coping: Goal: Ability to adjust to condition or change in health will improve Outcome: Progressing   Problem: Fluid Volume: Goal: Ability to maintain a balanced intake and output will improve Outcome: Progressing   Problem: Health Behavior/Discharge Planning: Goal: Ability to identify and utilize available resources and services will improve Outcome: Progressing Goal: Ability to manage health-related needs will improve Outcome: Progressing   Problem: Metabolic: Goal: Ability to maintain appropriate glucose levels will improve Outcome: Progressing   Problem: Nutritional: Goal: Maintenance of adequate nutrition will improve Outcome: Progressing Goal: Progress toward achieving an optimal weight will improve Outcome: Progressing   Problem: Skin Integrity: Goal: Risk for impaired skin integrity will decrease Outcome: Progressing   Problem: Tissue Perfusion: Goal: Adequacy of tissue perfusion will improve Outcome: Progressing   Problem: Education: Goal: Knowledge of General Education information will improve Description: Including pain rating scale, medication(s)/side effects and non-pharmacologic comfort measures Outcome: Progressing   Problem: Health Behavior/Discharge Planning: Goal: Ability to manage health-related needs will improve Outcome: Progressing   Problem: Clinical Measurements: Goal: Ability to maintain clinical measurements within normal limits will improve Outcome: Progressing Goal: Will remain free from infection Outcome: Progressing Goal: Diagnostic test results will improve Outcome: Progressing Goal: Respiratory complications will improve Outcome: Progressing Goal: Cardiovascular complication will  be avoided Outcome: Progressing   Problem: Activity: Goal: Risk for activity intolerance will decrease Outcome: Progressing   Problem: Nutrition: Goal: Adequate nutrition will be maintained Outcome: Progressing   Problem: Coping: Goal: Level of anxiety will decrease Outcome: Progressing   Problem: Elimination: Goal: Will not experience complications related to bowel motility Outcome: Progressing Goal: Will not experience complications related to urinary retention Outcome: Progressing   Problem: Pain Managment: Goal: General experience of comfort will improve and/or be controlled Outcome: Progressing   Problem: Safety: Goal: Ability to remain free from injury will improve Outcome: Progressing   Problem: Skin Integrity: Goal: Risk for impaired skin integrity will decrease Outcome: Progressing   Problem: Education: Goal: Knowledge of disease and its progression will improve Outcome: Progressing   Problem: Health Behavior/Discharge Planning: Goal: Ability to manage health-related needs will improve Outcome: Progressing   Problem: Clinical Measurements: Goal: Complications related to the disease process or treatment will be avoided or minimized Outcome: Progressing Goal: Dialysis access will remain free of complications Outcome: Progressing   Problem: Activity: Goal: Activity intolerance will improve Outcome: Progressing   Problem: Fluid Volume: Goal: Fluid volume balance will be maintained or improved Outcome: Progressing   Problem: Nutritional: Goal: Ability to make appropriate dietary choices will improve Outcome: Progressing   Problem: Respiratory: Goal: Respiratory symptoms related to disease process will be avoided Outcome: Progressing   Problem: Self-Concept: Goal: Body image disturbance will be avoided or minimized Outcome: Progressing   Problem: Urinary Elimination: Goal: Progression of disease will be identified and treated Outcome:  Progressing

## 2023-12-25 NOTE — Progress Notes (Signed)
 Contacted by medical team this morning to be advised of pt's decision to stop HD and not continue HD at d/c. Contacted [facility] admissions and [facility] to be advised that pt will not require out-pt HD care at d/c. Referral will be cancelled.   Randine Mungo Dialysis Navigator [phone]

## 2023-12-25 NOTE — TOC Progression Note (Signed)
 Transition of Care [facility]) - Progression Note    Patient Details  Name: [name] MRN: [id number] Date of Birth: 1955/10/06  [facility] CM/SW Contact  Nola Devere Hands, RN Phone Number: 12/25/2023, 1:52 PM  Clinical Narrative:    Patient has decided to stop Dialysis and wants comfort care. CM has sent request to Eleanor Nail, Liaison with [facility], for bed availability. CM will continue to follow.     Barriers to Discharge: No Barriers Identified               Expected Discharge Plan and Services In-house Referral: Hospice / Palliative Care   Post Acute Care Choice: Residential Hospice Bed Living arrangements for the past 2 months: Single Family Home                                       Social Drivers of Health (SDOH) Interventions SDOH Screenings   Food Insecurity: No Food Insecurity (12/16/2023)  Housing: Low Risk  (12/16/2023)  Recent Concern: Housing - High Risk (11/05/2023)  Transportation Needs: No Transportation Needs (12/16/2023)  Utilities: Not At Risk (12/16/2023)  Alcohol  Screen: Low Risk  (11/05/2023)  Social Connections: Moderately Isolated (12/16/2023)  Tobacco Use: Medium Risk (12/20/2023)    Readmission Risk Interventions     No data to display

## 2023-12-25 NOTE — Progress Notes (Signed)
 [facility] Liaison Note  Received request from Transitions of Care Manager for patient interest in [facility].  Eligibility is pending.  Met and spoke with patient today to confirm interest and explain services. Patient is agreeable to transfer when appropriate and bed is available.  Transitions of Care manager is aware hospital liaison will follow up tomorrow for availability.  Please don't hesitate to call with questions.   Thank you Inocente Jacobs BSN RN [facility] Liaison [phone]

## 2023-12-25 NOTE — Progress Notes (Signed)
 PROGRESS NOTE                                                                                                                                                                                                             Patient Demographics:    [name], is a 68 y.o. female, DOB - 06-05-1955, FMW:[id number]  Outpatient Primary MD for the patient is Valma Carwin, MD    LOS - 8  Admit date - 12/15/2023    Chief Complaint  Patient presents with   Chest Pain       Brief Narrative (HPI from H&P)       68 y.o. female long-term resident at SNF with medical history significant of pulmonary hypertension, chronic respiratory failure requiring 2L [place] at baseline, Afib on Eliquis , HFpEF (EF 60-65% in 08/2023), DM2 (A1c 5.7 in 08/2023), DVT/PE x3 s/p IVC filter in 09/2009, CKD stage IV, and morbid obesity p/w SOB/cp iso volume overload 2/2 LLL CAP and HFpEF exacerbation c/b worsening renal failure and found to have liver lesions c/f metastases.    Subjective:   Patient in bed, comfortable, no nausea, no vomiting, no headache .  No further diarrhea.  Foley catheter discontinued yesterday.  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 -Her poor functional status, multiorgan failure she is very poor candidate for any systemic treatments, and recommendation is to pursue hospice, I have discussed with the patient, she is in agreement, and as discussed with her she reports she would like to stop her dialysis as well and to pursue hospice, awaiting palliative care consult.   Dyspnea/chronic hypoxic respiratory failure - Multifactorial, due to CHF and pneumonia, and pulmonary  hypertension  Acute on chronic diastolic CHF  -Volume management with dialysis -Making urine, remains on IV diuresis HFpEF EF 60% on recent echocardiogram recent [facility] noted no significant CAD, PA pressure 62/16, wedge of 11, PVR 4.4, group 3 pulmonary hypertension suspected, Diuresis per nephrology. - Now patient will transition to hospice on the fourth of dialysis as discussed with her,  Left lower lobe community-acquired pneumonia -Continue with antibiotics -Encouraged use incentive spirometry and flutter valve  Citrobacter UTI - receved 3 days of IV cefepime   H/o pulmonary HTN (group 3 suspected) H/o morbid obesity supportive care.   -Follow-up with PCP for weight loss.  BMI of 57    Paroxysmal Afib   -anticoagulation currently heparin  drip post biopsy will resume once cleared by IR, long-term on Eliquis .,  Back on Eliquis .   Hypothyroid  -PTA Synthroid    HTN  -PTA amlodipine    Constipation with nausea.  Last BM per patient now over 4 weeks ago, on [place] bowel regimen including Colace, MiraLAX , Movantik , Dulcolax, lactulose .  Also received enema on 12/21/2023  Chronic lymphedema - Venous stasis ulcer of LLE  -Continue wrapping BLE and Unna boots to BLE as well   MDD  -PTA Abilify , Celexa , and Valium   DM2 -PTA long acting insulin  20U daily and SSI TID AC prn   Lab Results  Component Value Date   HGBA1C 5.7 (H) 09/04/2023   .CBG (last 3)  Recent Labs    12/24/23 1943 12/25/23 0744 12/25/23 1142  GLUCAP 94 134* 113*          Condition - Extremely Guarded  Family Communication  : Discussed with patient, all of her questions answered  Code Status :  Full  Consults  :  Renal, IR, oncology, palliative  PUD Prophylaxis :     Procedures  :     CT-guided liver biopsy due now on 12/22/2023.    Right IJ HD catheter placed by IR on 12/21/2023.    CT Liver - 1. Hypoattenuating liver lesions with peripheral enhancement, largest measuring 7.9 cm in the  inferior right hepatic lobe concerning for metastases. Differential consideration includes hepatic abscesses. Hepatic protocol MRI with and without IV contrast is recommended. 2. Small right pleural effusion and RLL pneumonia or atelectasis.      Disposition Plan  :    Status is: Inpatient   DVT Prophylaxis  :  Hep gtt, will resume on Eliquis .   Lab Results  Component Value Date   PLT 133 (L) 12/25/2023    Diet :  Diet Order             Diet heart healthy/carb modified Room service appropriate? Yes; Fluid consistency: Thin  Diet effective now                    Inpatient Medications  Scheduled Meds:  acetaminophen   1,000 mg Oral TID   amLODipine   10 mg Oral Daily   apixaban   5 mg Oral BID   ARIPiprazole   2 mg Oral QHS   Chlorhexidine  Gluconate Cloth  6 each Topical Q0600   citalopram   20 mg Oral QHS   docusate sodium   200 mg Oral BID   furosemide   80 mg Intravenous Q M,W,F   insulin  aspart  0-15 Units Subcutaneous TID WC   insulin  glargine  15 Units Subcutaneous Daily   levothyroxine   100 mcg Oral QAC breakfast   melatonin  10 mg Oral QHS   naloxegol  oxalate  25 mg Oral Daily   polyethylene glycol  17 g Oral BID   rosuvastatin   10 mg Oral Daily   Continuous Infusions:  promethazine  (PHENERGAN ) injection (IM or IVPB) Stopped (12/20/23 2358)   PRN Meds:.diazepam , HYDROmorphone  (DILAUDID ) injection, ondansetron  (ZOFRAN ) IV, mouth rinse, promethazine  (PHENERGAN ) injection (IM or IVPB), senna-docusate    Objective:   Vitals:   12/25/23 0000 12/25/23 0414 12/25/23 0740 12/25/23 1140  BP: 138/61 (!) 146/53 (!) 143/53 (!) 132/56  Pulse: (!) 50 (!)  50 (!) 50 (!) 49  Resp: 16 18  13   Temp: (!) 97.3 F (36.3 C) (!) 97.5 F (36.4 C) 99.2 F (37.3 C) 99.3 F (37.4 C)  TempSrc: Oral Oral Axillary Oral  SpO2: 98% 99% 99% 100%  Weight:      Height:        Wt Readings from Last 3 Encounters:  12/24/23 132.5 kg  12/05/23 (!) 137.2 kg  11/05/23 136.1 kg      Intake/Output Summary (Last 24 hours) at 12/25/2023 1154 Last data filed at 12/24/2023 1846 Gross per 24 hour  Intake --  Output 2850 ml  Net -2850 ml      Physical Exam  Awake Alert, Oriented X 3, with morbid obesity Symmetrical Chest wall movement, Good air movement bilaterally, CTAB RRR,No Gallops,Rubs or new Murmurs, No Parasternal Heave +ve B.Sounds, Abd Soft, No tenderness, No rebound - guarding or rigidity. No Cyanosis, bilateral Unna boots  Foley discontinued, right upper chest HD catheter  Data Review:    Recent Labs  Lab 12/19/23 0227 12/20/23 0704 12/21/23 0452 12/22/23 0324 12/23/23 0232 12/24/23 0637 12/25/23 0219  WBC 5.6 6.8 8.5 7.7 7.6 7.1 8.0  HGB 7.6* 8.3* 8.4* 8.2* 9.1* 8.7* 9.2*  HCT 26.0* 27.7* 27.9* 27.8* 30.2* 29.6* 30.7*  PLT 146* 163 150 129* 142* 131* 133*  MCV 102.8* 101.8* 102.2* 102.2* 103.1* 103.1* 103.4*  MCH 30.0 30.5 30.8 30.1 31.1 30.3 31.0  MCHC 29.2* 30.0 30.1 29.5* 30.1 29.4* 30.0  RDW 16.9* 16.9* 16.9* 17.0* 17.1* 17.0* 17.0*  LYMPHSABS 0.4* 0.5* 0.5* 0.3*  --   --   --   MONOABS 0.7 0.8 0.8 0.6  --   --   --   EOSABS 0.2 0.1 0.2 0.1  --   --   --   BASOSABS 0.0 0.0 0.0 0.0  --   --   --     Recent Labs  Lab 12/19/23 0227 12/19/23 0952 12/20/23 0704 12/20/23 2345 12/21/23 0452 12/22/23 0324 12/23/23 0232 12/24/23 0637 12/25/23 0219  NA 135  --  135  --  137 135 140 136 138  K 4.5  --  4.9  --  5.1 4.5 4.3 3.8 3.9  CL 100  --  100  --  100 100 99 96* 98  CO2 24  --  22  --  21* 23 23 28 24   ANIONGAP 11  --  13  --  16* 12 18* 12 16*  GLUCOSE 95  --  118*  --  96 99 103* 85 110*  BUN 140*  --  143*  --  147* 121* 84* 90* 60*  CREATININE 3.55*  --  3.77*  --  4.07* 3.77* 3.08* 2.99* 2.25*  AST 18  --  26  --  27 22  --   --   --   ALT 14  --  17  --  18 14  --   --   --   ALKPHOS 60  --  69  --  71 70  --   --   --   BILITOT 0.5  --  0.6  --  0.6 0.4  --   --   --   ALBUMIN  2.9*  --  3.1*  --  3.0* 2.7* 2.7*  2.7* 2.8*  INR  --  1.6*  --  1.5*  --   --   --   --   --   MG 2.8*  --  2.8*  --  2.9*  --   --   --   --   PHOS  --   --   --   --   --   --  5.7* 5.6* 4.8*  CALCIUM  8.4*  --  8.6*  --  8.6* 8.3* 8.6* 8.6* 8.6*      Recent Labs  Lab 12/19/23 0227 12/19/23 0952 12/20/23 0704 12/20/23 2345 12/21/23 0452 12/22/23 0324 12/23/23 0232 12/24/23 0637 12/25/23 0219  INR  --  1.6*  --  1.5*  --   --   --   --   --   MG 2.8*  --  2.8*  --  2.9*  --   --   --   --   CALCIUM  8.4*  --  8.6*  --  8.6* 8.3* 8.6* 8.6* 8.6*    --------------------------------------------------------------------------------------------------------------- Lab Results  Component Value Date   CHOL 105 09/03/2023   HDL 50 09/03/2023   LDLCALC 44 09/03/2023   TRIG 56 09/03/2023   CHOLHDL 2.1 09/03/2023    Lab Results  Component Value Date   HGBA1C 5.7 (H) 09/04/2023   No results for input(s): TSH, T4TOTAL, FREET4, T3FREE, THYROIDAB in the last 72 hours. No results for input(s): VITAMINB12, FOLATE, FERRITIN, TIBC, IRON , RETICCTPCT in the last 72 hours.    Micro Results Recent Results (from the past 240 hours)  Urine Culture     Status: Abnormal   Collection Time: 12/16/23 12:13 AM   Specimen: Urine, Clean Catch  Result Value Ref Range Status   Specimen Description URINE, CLEAN CATCH  Final   Special Requests   Final    NONE Performed at [facility], [address] [place], [place] [address]    Culture 80,000 COLONIES/mL CITROBACTER FREUNDII (A)  Final   Report Status 12/19/2023 FINAL  Final   Organism ID, Bacteria CITROBACTER FREUNDII (A)  Final      Susceptibility   Citrobacter freundii - MIC*    CEFEPIME  <=0.12 SENSITIVE Sensitive     ERTAPENEM <=0.12 SENSITIVE Sensitive     CEFTRIAXONE  32 RESISTANT Resistant     CIPROFLOXACIN  <=0.06 SENSITIVE Sensitive     GENTAMICIN  <=1 SENSITIVE Sensitive     NITROFURANTOIN <=16 SENSITIVE Sensitive     TRIMETH /SULFA  >=320  RESISTANT Resistant     PIP/TAZO Value in next row Sensitive ug/mL     <=4 SENSITIVEThis is a modified FDA-approved test that has been validated and its performance characteristics determined by the reporting laboratory.  This laboratory is certified under the Clinical Laboratory Improvement Amendments CLIA as qualified to perform high complexity clinical laboratory testing.    MEROPENEM Value in next row Sensitive      <=4 SENSITIVEThis is a modified FDA-approved test that has been validated and its performance characteristics determined by the reporting laboratory.  This laboratory is certified under the Clinical Laboratory Improvement Amendments CLIA as qualified to perform high complexity clinical laboratory testing.    * 80,000 COLONIES/mL CITROBACTER FREUNDII  Blood culture (routine x 2)     Status: None   Collection Time: 12/16/23 12:13 AM   Specimen: BLOOD  Result Value Ref Range Status   Specimen Description BLOOD SITE NOT SPECIFIED  Final   Special Requests   Final    BOTTLES DRAWN AEROBIC AND ANAEROBIC Blood Culture results may not be optimal due to an inadequate volume of blood received in culture bottles   Culture   Final    NO GROWTH 5 DAYS  Performed at [facility], [address] [place], [place] [address]    Report Status 12/21/2023 FINAL  Final  Blood culture (routine x 2)     Status: None   Collection Time: 12/16/23 12:13 AM   Specimen: BLOOD  Result Value Ref Range Status   Specimen Description BLOOD SITE NOT SPECIFIED  Final   Special Requests   Final    BOTTLES DRAWN AEROBIC AND ANAEROBIC Blood Culture adequate volume   Culture   Final    NO GROWTH 5 DAYS Performed at [facility], [address] [place], [place] [address]    Report Status 12/21/2023 FINAL  Final    Radiology Report No results found.     Signature  -   Brayton Lye M.D on 12/25/2023 at 11:54 AM   -  To page go to [email or web address]

## 2023-12-25 NOTE — Plan of Care (Signed)
 Problem: Education: Goal: Ability to describe self-care measures that may prevent or decrease complications (Diabetes Survival Skills Education) will improve Outcome: Progressing Goal: Individualized Educational Video(s) Outcome: Progressing   Problem: Coping: Goal: Ability to adjust to condition or change in health will improve Outcome: Progressing   Problem: Fluid Volume: Goal: Ability to maintain a balanced intake and output will improve Outcome: Progressing   Problem: Health Behavior/Discharge Planning: Goal: Ability to identify and utilize available resources and services will improve Outcome: Progressing Goal: Ability to manage health-related needs will improve Outcome: Progressing   Problem: Metabolic: Goal: Ability to maintain appropriate glucose levels will improve Outcome: Progressing   Problem: Nutritional: Goal: Maintenance of adequate nutrition will improve Outcome: Progressing Goal: Progress toward achieving an optimal weight will improve Outcome: Progressing   Problem: Skin Integrity: Goal: Risk for impaired skin integrity will decrease Outcome: Progressing   Problem: Tissue Perfusion: Goal: Adequacy of tissue perfusion will improve Outcome: Progressing   Problem: Education: Goal: Knowledge of General Education information will improve Description: Including pain rating scale, medication(s)/side effects and non-pharmacologic comfort measures Outcome: Progressing   Problem: Health Behavior/Discharge Planning: Goal: Ability to manage health-related needs will improve Outcome: Progressing   Problem: Clinical Measurements: Goal: Ability to maintain clinical measurements within normal limits will improve Outcome: Progressing Goal: Will remain free from infection Outcome: Progressing Goal: Diagnostic test results will improve Outcome: Progressing Goal: Respiratory complications will improve Outcome: Progressing Goal: Cardiovascular complication will  be avoided Outcome: Progressing   Problem: Activity: Goal: Risk for activity intolerance will decrease Outcome: Progressing   Problem: Nutrition: Goal: Adequate nutrition will be maintained Outcome: Progressing   Problem: Coping: Goal: Level of anxiety will decrease Outcome: Progressing   Problem: Elimination: Goal: Will not experience complications related to bowel motility Outcome: Progressing Goal: Will not experience complications related to urinary retention Outcome: Progressing   Problem: Pain Managment: Goal: General experience of comfort will improve and/or be controlled Outcome: Progressing   Problem: Safety: Goal: Ability to remain free from injury will improve Outcome: Progressing   Problem: Skin Integrity: Goal: Risk for impaired skin integrity will decrease Outcome: Progressing   Problem: Education: Goal: Knowledge of disease and its progression will improve Outcome: Progressing   Problem: Health Behavior/Discharge Planning: Goal: Ability to manage health-related needs will improve Outcome: Progressing   Problem: Clinical Measurements: Goal: Complications related to the disease process or treatment will be avoided or minimized Outcome: Progressing Goal: Dialysis access will remain free of complications Outcome: Progressing   Problem: Activity: Goal: Activity intolerance will improve Outcome: Progressing   Problem: Fluid Volume: Goal: Fluid volume balance will be maintained or improved Outcome: Progressing   Problem: Nutritional: Goal: Ability to make appropriate dietary choices will improve Outcome: Progressing   Problem: Respiratory: Goal: Respiratory symptoms related to disease process will be avoided Outcome: Progressing   Problem: Self-Concept: Goal: Body image disturbance will be avoided or minimized Outcome: Progressing   Problem: Urinary Elimination: Goal: Progression of disease will be identified and treated Outcome:  Progressing   Problem: Education: Goal: Knowledge of the prescribed therapeutic regimen will improve Outcome: Progressing   Problem: Coping: Goal: Ability to identify and develop effective coping behavior will improve Outcome: Progressing   Problem: Clinical Measurements: Goal: Quality of life will improve Outcome: Progressing   Problem: Respiratory: Goal: Verbalizations of increased ease of respirations will increase Outcome: Progressing   Problem: Role Relationship: Goal: Family's ability to cope with current situation will improve Outcome: Progressing Goal: Ability to verbalize concerns, feelings, and thoughts  to partner or family member will improve Outcome: Progressing   Problem: Pain Management: Goal: Satisfaction with pain management regimen will improve Outcome: Progressing

## 2023-12-25 NOTE — Consult Note (Signed)
 Consultation Note Date: 12/25/2023   Patient Name: [name]  DOB: 09-21-1955  MRN: [id number]  Age / Sex: 68 y.o., female  PCP: Valma Carwin, MD Referring Physician: Sherlon Brayton RAMAN, MD  Reason for Consultation: Establishing goals of care  HPI/Patient Profile: 68 y.o. female  with past medical history of pulmonary hypertension, chronic respiratory failure requiring 2L [place] at baseline, Afib on Eliquis , HFpEF (EF 60-65% in 08/2023), DM2, DVT/PE x3 s/p IVC filter, CKD stage IV, and morbid obesity admitted on 12/15/2023 with shortness of breath and chest pain.   In the ED, patient was found to be in volume overload secondary to left lower lobe pneumonia and HFpEF exacerbation with worsening renal failure.  Dialysis was started on 9/15.  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  Oncology has evaluated and recommended consideration of hospice given her poor functional status and not a good candidate for chemotherapy.  PMT has been consulted to assist with goals of care conversation.  Clinical Assessment and Goals of Care:  I have reviewed medical records including EPIC notes, labs and imaging, assessed the patient and then at the bedside to discuss diagnosis prognosis, GOC, EOL wishes, disposition and options.  I introduced Palliative Medicine as specialized medical care for people living with serious illness. It focuses on providing relief from the symptoms and stress of a serious illness. The goal is to improve quality of life for both the patient and the family.  We discussed a brief life review of the patient and then focused on their current illness.  The natural disease trajectory and expectations at EOL were discussed.  I attempted to elicit values and goals of care important to the patient.    Medical History Review and Understanding:  We discussed patient's acute  illness in the context of their chronic comorbidities.  Patient does understand the severity of her illness.  Social History: Patient states she has 1 sister who is in her life as well as 1 brother.  She has not spoken to her other sister or her son in several years.  She resides at [facility].  She is a Curator.  Palliative Symptoms: Nausea and pain  Advance Directives: Reviewed MOST form, advance directives, and goals of care on file in [facility] from 09/08/2023 and 09/09/2023.  Code Status: Concepts specific to code status, artifical feeding and hydration, and rehospitalization were considered and discussed.  She confirms DNR/DNI.  MOST form on file remains current/accurate.  Discussion: Patient shares how she was quite convinced that she had a cancer from the moment the discussions around biopsy were started.  She is not surprised.  She is at peace with her own mortality.  She also shares that she has already decided to discontinue dialysis and would like to learn more about next apps to ensure she is as comfortable as possible nature take its course.  I provided detailed education on the role of hospice, settings that hospice can be provided, and symptom management strategies.  We also discussed option of comfort focused care while determining if she is a candidate for hospice facility yet.  Patient shares that her facility has already packed up her belongings and that she has no option to return there.  She is agreeable to comfort focused care in the hospital in the meantime and is hopeful for being in place, as she grew up just down the road from there.   The difference between aggressive  medical intervention and comfort care was considered in light of the patient's goals of care. Hospice and Palliative Care services outpatient were explained and offered.   Discussed the importance of continued conversation with family and the medical providers regarding overall plan of care and treatment options,  ensuring decisions are within the context of the patient's values and GOCs.   Questions and concerns were addressed.  The family was encouraged to call with questions or concerns.  PMT will continue to support holistically.   SUMMARY OF RECOMMENDATIONS   - DNR/DNI -Transition to full comfort care today Dilaudid  PRN for pain/air hunger/comfort Robinul  PRN for excessive secretions Ativan  PRN for agitation/anxiety Zofran , Compazine , Phenergan  PRN for nausea Liquifilm tears PRN for dry eyes Haldol  PRN for agitation/anxiety/nausea May have comfort feeding Comfort cart for family Unrestricted visitations in the setting of EOL (per policy) Oxygen PRN 2L or less for comfort. No escalation.   - TOC consulted for assistance with referral to hospice facility[facility], assistance appreciated - Psychosocial and emotional support provided -PMT will continue to follow and support  Prognosis:  < 2 weeks would not be surprising given discontinuation of dialysis and poor oral intake in the setting of metastatic cancer  Discharge Planning: Hospice facility      Primary Diagnoses: Present on Admission:  CAP (community acquired pneumonia)   Physical Exam Vitals and nursing note reviewed.  Constitutional:      General: She is not in acute distress.    Appearance: She is ill-appearing.  HENT:     Head: Normocephalic and atraumatic.  Cardiovascular:     Rate and Rhythm: Bradycardia present.  Pulmonary:     Effort: Pulmonary effort is normal.  Neurological:     Mental Status: She is alert and oriented to person, place, and time.  Psychiatric:        Mood and Affect: Mood normal.        Behavior: Behavior normal.     Vital Signs: BP (!) 143/53 (BP Location: Left Wrist)   Pulse (!) 50   Temp 99.2 F (37.3 C) (Axillary)   Resp 18   Ht 5' 1 (1.549 m)   Wt 132.5 kg   SpO2 99%   BMI 55.19 kg/m  Pain Scale: 0-10 POSS *See Group Information*: S-Acceptable,Sleep, easy to  arouse Pain Score: 8    SpO2: SpO2: 99 % O2 Device:SpO2: 99 % O2 Flow Rate: .O2 Flow Rate (L/min): 3 L/min    Taci Sterling SHAUNNA Fell, PA-C  Palliative Medicine Team Team phone # [phone]  Thank you for allowing the Palliative Medicine Team to assist in the care of this patient. Please utilize secure chat with additional questions, if there is no response within 30 minutes please call the above phone number.  Palliative Medicine Team providers are available by phone from 7am to 7pm daily and can be reached through the team cell phone.  Should this patient require assistance outside of these hours, please call the patient's attending physician.    Billing based on MDM: high  Problems Addressed: One acute or chronic illness or injury that poses a threat to life or bodily function  Amount and/or Complexity of Data: Category 1:Review of prior external note(s) from each unique source and Review of the result(s) of each unique test, Category 2:Independent interpretation of a test performed by another physician/other qualified health care professional (not separately reported), and Category 3:Discussion of management or test interpretation with external physician/other qualified health care professional/appropriate source (not separately reported)  Risks: Parenteral controlled substances and Decision not to resuscitate or to de-escalate care because of poor prognosis

## 2023-12-25 NOTE — Progress Notes (Signed)
 OT Cancellation Note and OT Discharge  Patient Details Name: [name] MRN: [id number] DOB: 13-May-1955   Cancelled Treatment:    Reason Eval/Treat Not Completed: Other (comment) (Per Case Manager note and MD order, pt transitioning to comfort care and OT signing off at this time. Please reconsult as appropriate.)  [name], OTR/L, MA Acute Rehab [phone]   [name] Horns 12/25/2023, 2:28 PM

## 2023-12-25 NOTE — Plan of Care (Signed)
 Contacted by primary team this AM.  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  I have notified HD SW who will reach out to her assigned outpatient HD unit to make them aware   Appreciate all teams and staff caring for the patient at this difficult time. Nephrology will sign off.  Please do not hesitate to reach out if we can be of assistance with her care   Katheryn JAYSON Saba, MD 10:12 AM 12/25/2023

## 2023-12-26 DIAGNOSIS — N179 Acute kidney failure, unspecified: Secondary | ICD-10-CM | POA: Diagnosis not present

## 2023-12-26 DIAGNOSIS — J189 Pneumonia, unspecified organism: Secondary | ICD-10-CM | POA: Diagnosis not present

## 2023-12-26 DIAGNOSIS — C787 Secondary malignant neoplasm of liver and intrahepatic bile duct: Secondary | ICD-10-CM | POA: Diagnosis not present

## 2023-12-26 DIAGNOSIS — Z515 Encounter for palliative care: Secondary | ICD-10-CM | POA: Diagnosis not present

## 2023-12-26 NOTE — Progress Notes (Signed)
 Daily Progress Note   Patient Name: [name]       Date: 12/26/2023 DOB: 1955/09/25  Age: 68 y.o. MRN#: [id number] Attending Physician: Sherlon Brayton RAMAN, MD Primary Care Physician: Valma Carwin, MD Admit Date: 12/15/2023  Reason for Consultation/Follow-up: Establishing goals of care  Subjective: Medical records reviewed including progress notes, MAR. Patient assessed at the bedside. She reports good relief from PRN dilaudid  and zofran  that has been administered. Denies adverse side effects. She spoke with hospice liaison yesterday and understands that a bed was not available yet. She would like to be pulled up in bed. Discussed with NT.  Questions and concerns addressed. PMT will continue to support holistically.   Length of Stay: 9   Physical Exam Vitals and nursing note reviewed.  Constitutional:      General: She is not in acute distress.    Appearance: She is ill-appearing.  HENT:     Head: Normocephalic and atraumatic.  Cardiovascular:     Rate and Rhythm: Bradycardia present.  Pulmonary:     Effort: Pulmonary effort is normal.  Skin:    General: Skin is warm and dry.  Neurological:     Mental Status: She is alert and oriented to person, place, and time.  Psychiatric:        Mood and Affect: Mood normal.        Behavior: Behavior normal.             Vital Signs: BP (!) 148/47 (BP Location: Left Wrist)   Pulse (!) 50   Temp 98.8 F (37.1 C) (Oral)   Resp 16   Ht 5' 1 (1.549 m)   Wt 132.5 kg   SpO2 99%   BMI 55.19 kg/m  SpO2: SpO2: 99 % O2 Device: O2 Device: Nasal Cannula O2 Flow Rate: O2 Flow Rate (L/min): 3 L/min      Palliative Assessment/Data: 30%   Palliative Care Assessment & Plan   Patient Profile: 68 y.o. female  with past medical history of pulmonary hypertension, chronic respiratory  failure requiring 2L [place] at baseline, Afib on Eliquis , HFpEF (EF 60-65% in 08/2023), DM2, DVT/PE x3 s/p IVC filter, CKD stage IV, and morbid obesity admitted on 12/15/2023 with shortness of breath and chest pain.    In the ED, patient was found to be in volume overload secondary to left lower lobe pneumonia and HFpEF exacerbation with worsening renal failure.  Dialysis was started on 9/15.  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  Oncology has evaluated and recommended consideration of hospice  given her poor functional status and not a good candidate for chemotherapy.   PMT has been consulted to assist with goals of care conversation.  Assessment: End of life care  Recommendations/Plan: Continue DNR/DNI Continue comfort focused care, no adjustments required today Patient remains stable for hospice facility when a bed is available PMT will continue to follow and support   Prognosis: < 2 weeks would not be surprising given discontinuation of dialysis and poor oral intake in the setting of metastatic cancer   Discharge Planning: Hospice facility  Care plan was discussed with Patient, NT          Kiyana Vazguez SHAUNNA Fell, PA-C  Palliative Medicine Team Team phone # [phone]  Thank you for allowing the Palliative Medicine Team to assist in the care of this patient. Please utilize secure chat with additional questions, if there is no response within 30 minutes please call the above phone number.  Palliative Medicine Team providers are available by phone from 7am to 7pm daily and can be reached through the team cell phone.  Should this patient require assistance outside of these hours, please call the patient's attending physician.     Time Total: 25  Visit consisted of counseling and education dealing with the complex and emotionally intense issues of symptom management and palliative care in the  setting of serious and potentially life-threatening illness. Greater than 50% of this time was spent counseling and coordinating care related to the above assessment and plan.  Personally spent 25 minutes in patient care including extensive chart review (labs, imaging, progress/consult notes, vital signs), medically appropraite exam, discussed with treatment team, education to patient, family, and staff, documenting clinical information, medication review and management, coordination of care, and available advanced directive documents.

## 2023-12-26 NOTE — TOC Progression Note (Addendum)
 Transition of Care [facility]) - Progression Note    Patient Details  Name: [name] MRN: [id number] Date of Birth: Jan 19, 1956  [facility] CM/SW Contact  Isaiah Public, LCSWA Phone Number: 12/26/2023, 4:35 PM  Clinical Narrative:     CSW messaged [facility] with [facility] to check on status of [facility] referral for patient. CSW awaiting to hear back.  Expected Discharge Plan: Hospice Medical Facility Barriers to Discharge: No Barriers Identified               Expected Discharge Plan and Services In-house Referral: Hospice / Palliative Care   Post Acute Care Choice: Residential Hospice Bed Living arrangements for the past 2 months: Single Family Home                                       Social Drivers of Health (SDOH) Interventions SDOH Screenings   Food Insecurity: No Food Insecurity (12/16/2023)  Housing: Low Risk  (12/16/2023)  Recent Concern: Housing - High Risk (11/05/2023)  Transportation Needs: No Transportation Needs (12/16/2023)  Utilities: Not At Risk (12/16/2023)  Alcohol  Screen: Low Risk  (11/05/2023)  Social Connections: Moderately Isolated (12/16/2023)  Tobacco Use: Medium Risk (12/20/2023)    Readmission Risk Interventions     No data to display

## 2023-12-26 NOTE — Plan of Care (Signed)
 Problem: Education: Goal: Ability to describe self-care measures that may prevent or decrease complications (Diabetes Survival Skills Education) will improve Outcome: Progressing Goal: Individualized Educational Video(s) Outcome: Progressing   Problem: Coping: Goal: Ability to adjust to condition or change in health will improve Outcome: Progressing   Problem: Fluid Volume: Goal: Ability to maintain a balanced intake and output will improve Outcome: Progressing   Problem: Health Behavior/Discharge Planning: Goal: Ability to identify and utilize available resources and services will improve Outcome: Progressing Goal: Ability to manage health-related needs will improve Outcome: Progressing   Problem: Metabolic: Goal: Ability to maintain appropriate glucose levels will improve Outcome: Progressing   Problem: Nutritional: Goal: Maintenance of adequate nutrition will improve Outcome: Progressing Goal: Progress toward achieving an optimal weight will improve Outcome: Progressing   Problem: Skin Integrity: Goal: Risk for impaired skin integrity will decrease Outcome: Progressing   Problem: Tissue Perfusion: Goal: Adequacy of tissue perfusion will improve Outcome: Progressing   Problem: Education: Goal: Knowledge of General Education information will improve Description: Including pain rating scale, medication(s)/side effects and non-pharmacologic comfort measures Outcome: Progressing   Problem: Health Behavior/Discharge Planning: Goal: Ability to manage health-related needs will improve Outcome: Progressing   Problem: Clinical Measurements: Goal: Ability to maintain clinical measurements within normal limits will improve Outcome: Progressing Goal: Will remain free from infection Outcome: Progressing Goal: Diagnostic test results will improve Outcome: Progressing Goal: Respiratory complications will improve Outcome: Progressing Goal: Cardiovascular complication will  be avoided Outcome: Progressing   Problem: Activity: Goal: Risk for activity intolerance will decrease Outcome: Progressing   Problem: Nutrition: Goal: Adequate nutrition will be maintained Outcome: Progressing   Problem: Coping: Goal: Level of anxiety will decrease Outcome: Progressing   Problem: Elimination: Goal: Will not experience complications related to bowel motility Outcome: Progressing Goal: Will not experience complications related to urinary retention Outcome: Progressing   Problem: Pain Managment: Goal: General experience of comfort will improve and/or be controlled Outcome: Progressing   Problem: Safety: Goal: Ability to remain free from injury will improve Outcome: Progressing   Problem: Skin Integrity: Goal: Risk for impaired skin integrity will decrease Outcome: Progressing   Problem: Education: Goal: Knowledge of disease and its progression will improve Outcome: Progressing   Problem: Health Behavior/Discharge Planning: Goal: Ability to manage health-related needs will improve Outcome: Progressing   Problem: Clinical Measurements: Goal: Complications related to the disease process or treatment will be avoided or minimized Outcome: Progressing Goal: Dialysis access will remain free of complications Outcome: Progressing   Problem: Activity: Goal: Activity intolerance will improve Outcome: Progressing   Problem: Fluid Volume: Goal: Fluid volume balance will be maintained or improved Outcome: Progressing   Problem: Nutritional: Goal: Ability to make appropriate dietary choices will improve Outcome: Progressing   Problem: Respiratory: Goal: Respiratory symptoms related to disease process will be avoided Outcome: Progressing   Problem: Self-Concept: Goal: Body image disturbance will be avoided or minimized Outcome: Progressing   Problem: Urinary Elimination: Goal: Progression of disease will be identified and treated Outcome:  Progressing   Problem: Education: Goal: Knowledge of the prescribed therapeutic regimen will improve Outcome: Progressing   Problem: Coping: Goal: Ability to identify and develop effective coping behavior will improve Outcome: Progressing   Problem: Clinical Measurements: Goal: Quality of life will improve Outcome: Progressing   Problem: Respiratory: Goal: Verbalizations of increased ease of respirations will increase Outcome: Progressing   Problem: Role Relationship: Goal: Family's ability to cope with current situation will improve Outcome: Progressing Goal: Ability to verbalize concerns, feelings, and thoughts  to partner or family member will improve Outcome: Progressing   Problem: Pain Management: Goal: Satisfaction with pain management regimen will improve Outcome: Progressing

## 2023-12-26 NOTE — Plan of Care (Signed)
  Problem: Education: Goal: Ability to describe self-care measures that may prevent or decrease complications (Diabetes Survival Skills Education) will improve Outcome: Progressing   Problem: Coping: Goal: Ability to adjust to condition or change in health will improve Outcome: Progressing   Problem: Skin Integrity: Goal: Risk for impaired skin integrity will decrease Outcome: Progressing   Problem: Health Behavior/Discharge Planning: Goal: Ability to manage health-related needs will improve Outcome: Progressing

## 2023-12-26 NOTE — Progress Notes (Signed)
 PROGRESS NOTE                                                                                                                                                                                                             Patient Demographics:    [name], is a 68 y.o. female, DOB - [birth year], FMW:[id number]  Outpatient Primary MD for the patient is Valma Carwin, MD    LOS - 9  Admit date - 12/15/2023    Chief Complaint  Patient presents with   Chest Pain       Brief Narrative (HPI from H&P)       68 y.o. female long-term resident at SNF with medical history significant of pulmonary hypertension, chronic respiratory failure requiring 2L [place] at baseline, Afib on Eliquis , HFpEF (EF 60-65% in 08/2023), DM2 (A1c 5.7 in 08/2023), DVT/PE x3 s/p IVC filter in 09/2009, CKD stage IV, and morbid obesity p/w SOB/cp iso volume overload 2/2 LLL CAP and HFpEF exacerbation c/b worsening renal failure and found to have liver lesions c/f metastases.  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 -Her poor functional status, multiorgan failure she is very poor candidate for any systemic treatments, and recommendation is to pursue hospice, I have discussed with the patient, she is in agreement, and as  discussed with her she reports she would like to stop her dialysis as well and to pursue hospice, medicine were consulted and she currently transition to full comfort measures   Dyspnea/chronic hypoxic respiratory failure - Multifactorial, due to CHF and pneumonia, and pulmonary hypertension  Acute on chronic diastolic CHF  -Volume management with dialysis -Making urine, remains on IV diuresis HFpEF EF 60% on recent echocardiogram recent [facility] noted no significant CAD, PA pressure 62/16, wedge of  11, PVR 4.4, group 3 pulmonary hypertension suspected, Diuresis per nephrology. - Now patient will comfort measures, with no dialysis, only symptomatic management.    Left lower lobe community-acquired pneumonia - Treated with antibiotics    Citrobacter UTI - receved 3 days of IV cefepime   H/o pulmonary HTN (group 3 suspected) H/o morbid obesity supportive care.   -Follow-up with PCP for weight loss.  BMI of 57  Paroxysmal Afib   -anticoagulation currently heparin  drip post biopsy will resume once cleared by IR, long-term on Eliquis .,  Back on Eliquis .   Hypothyroid  -PTA Synthroid    HTN  -PTA amlodipine    Constipation with nausea.  Last BM per patient now over 4 weeks ago, on [place] bowel regimen including Colace, MiraLAX , Movantik , Dulcolax, lactulose .  Also received enema on 12/21/2023  Chronic lymphedema - Venous stasis ulcer of LLE  -Continue wrapping BLE and Unna boots to BLE as well   MDD  -PTA Abilify , Celexa , and Valium   DM2 -PTA long acting insulin  20U daily and SSI TID AC prn   Lab Results  Component Value Date   HGBA1C 5.7 (H) 09/04/2023   .CBG (last 3)  Recent Labs    12/24/23 1943 12/25/23 0744 12/25/23 1142  GLUCAP 94 134* 113*   Goals of care End-of-life care - Palliative medicine input greatly appreciated, at this point she is a full comfort measure, dialysis has been stopped, plan for residential hospice placement when bed is available     Condition -  Extremely Guarded  Family Communication  : Discussed with patient, all of her questions answered  Code Status : Comfort Consults  :  Renal, IR, oncology, palliative  PUD Prophylaxis :     Procedures  :     CT-guided liver biopsy due now on 12/22/2023.    Right IJ HD catheter placed by IR on 12/21/2023.    CT Liver - 1. Hypoattenuating liver lesions with peripheral enhancement, largest measuring 7.9 cm in the inferior right hepatic lobe concerning for metastases. Differential consideration includes hepatic abscesses. Hepatic protocol MRI with and without IV contrast is recommended. 2. Small right pleural effusion and RLL pneumonia or atelectasis.      Disposition Plan  :    Status is: Inpatient   DVT Prophylaxis  :  Hep gtt, will resume on Eliquis .   Lab Results  Component Value Date   PLT 133 (L) 12/25/2023    Diet :  Diet Order             Diet regular Room service appropriate? Yes; Fluid consistency: Thin  Diet effective now                    Inpatient Medications  Scheduled Meds:  acetaminophen   1,000 mg Oral TID   ARIPiprazole   2 mg Oral QHS   Chlorhexidine  Gluconate Cloth  6 each Topical Q0600   citalopram   20 mg Oral QHS   melatonin  10 mg Oral QHS   polyethylene glycol  17 g Oral BID   Continuous Infusions:  promethazine  (PHENERGAN ) injection (IM or IVPB) Stopped (12/20/23 2358)   PRN Meds:.antiseptic oral rinse, artificial tears, bisacodyl , diphenhydrAMINE , glycopyrrolate  **OR** glycopyrrolate  **OR** glycopyrrolate , haloperidol  **OR** haloperidol  **OR** haloperidol  lactate, HYDROmorphone  (DILAUDID ) injection, LORazepam  **OR** LORazepam  **OR** LORazepam , ondansetron  (ZOFRAN ) IV, mouth rinse, promethazine  (PHENERGAN ) injection (IM or IVPB), senna-docusate    Objective:   Vitals:   12/25/23 1140 12/25/23 1210 12/25/23 1937 12/26/23 0749  BP: (!) 132/56 (!) 121/42 (!) 134/47 ROLLEN)  148/47  Pulse: (!) 49 (!) 50 (!) 50 (!) 50  Resp: 13 20 (!) 22 16   Temp: 99.3 F (37.4 C) 97.8 F (36.6 C) 98 F (36.7 C) 98.8 F (37.1 C)  TempSrc: Oral Oral Oral Oral  SpO2: 100% 100% 91% 99%  Weight:      Height:        Wt Readings from Last 3 Encounters:  12/24/23 132.5 kg  12/05/23 (!) 137.2 kg  11/05/23 136.1 kg    No intake or output data in the 24 hours ending 12/26/23 1019     Physical Exam  Awake Alert, Oriented X 3, with morbid obesity Symmetrical Chest wall movement, Good air movement bilaterally, CTAB RRR,No Gallops,Rubs or new Murmurs, No Parasternal Heave +ve B.Sounds, Abd Soft, No tenderness, No rebound - guarding or rigidity. No Cyanosis, bilateral Unna boots  Right upper chest HD catheter, Foley will be reinserted  Data Review:    Recent Labs  Lab 12/20/23 0704 12/21/23 0452 12/22/23 0324 12/23/23 0232 12/24/23 0637 12/25/23 0219  WBC 6.8 8.5 7.7 7.6 7.1 8.0  HGB 8.3* 8.4* 8.2* 9.1* 8.7* 9.2*  HCT 27.7* 27.9* 27.8* 30.2* 29.6* 30.7*  PLT 163 150 129* 142* 131* 133*  MCV 101.8* 102.2* 102.2* 103.1* 103.1* 103.4*  MCH 30.5 30.8 30.1 31.1 30.3 31.0  MCHC 30.0 30.1 29.5* 30.1 29.4* 30.0  RDW 16.9* 16.9* 17.0* 17.1* 17.0* 17.0*  LYMPHSABS 0.5* 0.5* 0.3*  --   --   --   MONOABS 0.8 0.8 0.6  --   --   --   EOSABS 0.1 0.2 0.1  --   --   --   BASOSABS 0.0 0.0 0.0  --   --   --     Recent Labs  Lab 12/20/23 0704 12/20/23 2345 12/21/23 0452 12/22/23 0324 12/23/23 0232 12/24/23 0637 12/25/23 0219  NA 135  --  137 135 140 136 138  K 4.9  --  5.1 4.5 4.3 3.8 3.9  CL 100  --  100 100 99 96* 98  CO2 22  --  21* 23 23 28 24   ANIONGAP 13  --  16* 12 18* 12 16*  GLUCOSE 118*  --  96 99 103* 85 110*  BUN 143*  --  147* 121* 84* 90* 60*  CREATININE 3.77*  --  4.07* 3.77* 3.08* 2.99* 2.25*  AST 26  --  27 22  --   --   --   ALT 17  --  18 14  --   --   --   ALKPHOS 69  --  71 70  --   --   --   BILITOT 0.6  --  0.6 0.4  --   --   --   ALBUMIN  3.1*  --  3.0* 2.7* 2.7* 2.7* 2.8*  INR  --  1.5*  --   --   --    --   --   MG 2.8*  --  2.9*  --   --   --   --   PHOS  --   --   --   --  5.7* 5.6* 4.8*  CALCIUM  8.6*  --  8.6* 8.3* 8.6* 8.6* 8.6*      Recent Labs  Lab 12/20/23 0704 12/20/23 2345 12/21/23 0452 12/22/23 0324 12/23/23 0232 12/24/23 0637 12/25/23 0219  INR  --  1.5*  --   --   --   --   --  MG 2.8*  --  2.9*  --   --   --   --   CALCIUM  8.6*  --  8.6* 8.3* 8.6* 8.6* 8.6*    --------------------------------------------------------------------------------------------------------------- Lab Results  Component Value Date   CHOL 105 09/03/2023   HDL 50 09/03/2023   LDLCALC 44 09/03/2023   TRIG 56 09/03/2023   CHOLHDL 2.1 09/03/2023    Lab Results  Component Value Date   HGBA1C 5.7 (H) 09/04/2023   No results for input(s): TSH, T4TOTAL, FREET4, T3FREE, THYROIDAB in the last 72 hours. No results for input(s): VITAMINB12, FOLATE, FERRITIN, TIBC, IRON , RETICCTPCT in the last 72 hours.    Micro Results No results found for this or any previous visit (from the past 240 hours).   Radiology Report No results found.     Signature  -   Brayton Lye M.D on 12/26/2023 at 10:19 AM   -  To page go to [email or web address]

## 2023-12-27 DIAGNOSIS — J189 Pneumonia, unspecified organism: Secondary | ICD-10-CM | POA: Diagnosis not present

## 2023-12-27 DIAGNOSIS — N179 Acute kidney failure, unspecified: Secondary | ICD-10-CM | POA: Diagnosis not present

## 2023-12-27 DIAGNOSIS — C787 Secondary malignant neoplasm of liver and intrahepatic bile duct: Secondary | ICD-10-CM | POA: Diagnosis not present

## 2023-12-27 DIAGNOSIS — Z515 Encounter for palliative care: Secondary | ICD-10-CM | POA: Diagnosis not present

## 2023-12-27 NOTE — Progress Notes (Signed)
 PROGRESS NOTE                                                                                                                                                                                                             Patient Demographics:    [name], is a 68 y.o. female, DOB - Oct 30, 1955, FMW:[id number]  Outpatient Primary MD for the patient is Valma Carwin, MD    LOS - 10  Admit date - 12/15/2023    Chief Complaint  Patient presents with   Chest Pain       Brief Narrative (HPI from H&P)       68 y.o. female long-term resident at SNF with medical history significant of pulmonary hypertension, chronic respiratory failure requiring 2L [place] at baseline, Afib on Eliquis , HFpEF (EF 60-65% in 08/2023), DM2 (A1c 5.7 in 08/2023), DVT/PE x3 s/p IVC filter in 09/2009, CKD stage IV, and morbid obesity p/w SOB/cp iso volume overload 2/2 LLL CAP and HFpEF exacerbation c/b worsening renal failure and found to have liver lesions c/f metastases.  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 -Her poor functional status, multiorgan failure she is very poor candidate for any systemic treatments, and recommendation is to pursue hospice, I have discussed with the patient, she is in agreement, and as discussed with her she reports she would like to stop  her dialysis as well and to pursue hospice, medicine were consulted and she currently transition to full comfort measures   Dyspnea/chronic hypoxic respiratory failure - Multifactorial, due to CHF and pneumonia, and pulmonary hypertension  Acute on chronic diastolic CHF  -Volume management with dialysis -Making urine, remains on IV diuresis HFpEF EF 60% on recent echocardiogram recent [facility] noted no significant CAD, PA pressure 62/16, wedge of 11, PVR 4.4, group 3 pulmonary hypertension suspected, Diuresis per  nephrology. - Now patient will comfort measures, with no dialysis, only symptomatic management.    Left lower lobe community-acquired pneumonia - Treated with antibiotics    Citrobacter UTI - receved 3 days of IV cefepime   H/o pulmonary HTN (group 3 suspected) H/o morbid obesity supportive care.   -BMI of 57  Paroxysmal Afib   -anticoagulation currently heparin  drip post biopsy will resume once cleared by IR, long-term on Eliquis .,  Back on Eliquis .   Hypothyroid  -PTA Synthroid    HTN  -PTA amlodipine    Constipation with nausea.  Last BM per patient now over 4 weeks ago, on [place] bowel regimen including Colace, MiraLAX , Movantik , Dulcolax, lactulose .  Also received enema on 12/21/2023  Chronic lymphedema - Venous stasis ulcer of LLE  - Unna boots   MDD  -PTA Abilify , Celexa , and Valium   DM2 - She is now comfort Lab Results  Component Value Date   HGBA1C 5.7 (H) 09/04/2023   .CBG (last 3)  Recent Labs    12/24/23 1943 12/25/23 0744 12/25/23 1142  GLUCAP 94 134* 113*   Goals of care End-of-life care - Palliative medicine input greatly appreciated, at this point she is a full comfort measure, dialysis has been stopped, plan for residential hospice placement when bed is available     Condition - Extremely Guarded  Family Communication  : Discussed with patient, all of her questions answered  Code Status : Comfort Consults  :  Renal, IR, oncology,  palliative  PUD Prophylaxis :     Procedures  :     CT-guided liver biopsy due now on 12/22/2023.    Right IJ HD catheter placed by IR on 12/21/2023.    CT Liver - 1. Hypoattenuating liver lesions with peripheral enhancement, largest measuring 7.9 cm in the inferior right hepatic lobe concerning for metastases. Differential consideration includes hepatic abscesses. Hepatic protocol MRI with and without IV contrast is recommended. 2. Small right pleural effusion and RLL pneumonia or atelectasis.      Disposition Plan  :    Status is: Inpatient   DVT Prophylaxis  :  Hep gtt, will resume on Eliquis .   Lab Results  Component Value Date   PLT 133 (L) 12/25/2023    Diet :  Diet Order             Diet regular Room service appropriate? Yes; Fluid consistency: Thin  Diet effective now                    Inpatient Medications  Scheduled Meds:  acetaminophen   1,000 mg Oral TID   ARIPiprazole   2 mg Oral QHS   Chlorhexidine  Gluconate Cloth  6 each Topical Q0600   citalopram   20 mg Oral QHS   melatonin  10 mg Oral QHS   polyethylene glycol  17 g Oral BID   Continuous Infusions:  promethazine  (PHENERGAN ) injection (IM or IVPB) Stopped (12/20/23 2358)   PRN Meds:.antiseptic oral rinse, artificial tears, bisacodyl , diphenhydrAMINE , glycopyrrolate  **OR** glycopyrrolate  **OR** glycopyrrolate , haloperidol  **OR** haloperidol  **OR** haloperidol  lactate, HYDROmorphone  (DILAUDID ) injection, LORazepam  **OR** LORazepam  **OR** LORazepam , ondansetron  (ZOFRAN ) IV, mouth rinse, promethazine  (PHENERGAN ) injection (IM or IVPB), senna-docusate    Objective:   Vitals:   12/25/23 1210 12/25/23 1937 12/26/23 0749 12/26/23 1939  BP: (!) 121/42 (!) 134/47 (!) 148/47 (!) 138/50  Pulse: (!) 50 (!) 50 (!) 50 (!) 50  Resp: 20 (!) 22 16   Temp: 97.8 F (36.6 C) 98 F (36.7 C) 98.8 F (37.1  C) 98.4 F (36.9 C)  TempSrc: Oral Oral Oral Oral  SpO2: 100% 91% 99%   Weight:      Height:         Wt Readings from Last 3 Encounters:  12/24/23 132.5 kg  12/05/23 (!) 137.2 kg  11/05/23 136.1 kg     Intake/Output Summary (Last 24 hours) at 12/27/2023 1156 Last data filed at 12/27/2023 0538 Gross per 24 hour  Intake --  Output 450 ml  Net -450 ml       Physical Exam  Wake, alert, no apparent distress Good air entry Abdomen soft Lower extremity with Unna boots Foley present   Data Review:    Recent Labs  Lab 12/21/23 0452 12/22/23 0324 12/23/23 0232 12/24/23 0637 12/25/23 0219  WBC 8.5 7.7 7.6 7.1 8.0  HGB 8.4* 8.2* 9.1* 8.7* 9.2*  HCT 27.9* 27.8* 30.2* 29.6* 30.7*  PLT 150 129* 142* 131* 133*  MCV 102.2* 102.2* 103.1* 103.1* 103.4*  MCH 30.8 30.1 31.1 30.3 31.0  MCHC 30.1 29.5* 30.1 29.4* 30.0  RDW 16.9* 17.0* 17.1* 17.0* 17.0*  LYMPHSABS 0.5* 0.3*  --   --   --   MONOABS 0.8 0.6  --   --   --   EOSABS 0.2 0.1  --   --   --   BASOSABS 0.0 0.0  --   --   --     Recent Labs  Lab 12/20/23 2345 12/21/23 0452 12/22/23 0324 12/23/23 0232 12/24/23 0637 12/25/23 0219  NA  --  137 135 140 136 138  K  --  5.1 4.5 4.3 3.8 3.9  CL  --  100 100 99 96* 98  CO2  --  21* 23 23 28 24   ANIONGAP  --  16* 12 18* 12 16*  GLUCOSE  --  96 99 103* 85 110*  BUN  --  147* 121* 84* 90* 60*  CREATININE  --  4.07* 3.77* 3.08* 2.99* 2.25*  AST  --  27 22  --   --   --   ALT  --  18 14  --   --   --   ALKPHOS  --  71 70  --   --   --   BILITOT  --  0.6 0.4  --   --   --   ALBUMIN   --  3.0* 2.7* 2.7* 2.7* 2.8*  INR 1.5*  --   --   --   --   --   MG  --  2.9*  --   --   --   --   PHOS  --   --   --  5.7* 5.6* 4.8*  CALCIUM   --  8.6* 8.3* 8.6* 8.6* 8.6*      Recent Labs  Lab 12/20/23 2345 12/21/23 0452 12/22/23 0324 12/23/23 0232 12/24/23 0637 12/25/23 0219  INR 1.5*  --   --   --   --   --   MG  --  2.9*  --   --   --   --   CALCIUM   --  8.6* 8.3* 8.6* 8.6* 8.6*     --------------------------------------------------------------------------------------------------------------- Lab Results  Component Value Date   CHOL 105 09/03/2023   HDL 50 09/03/2023   LDLCALC 44 09/03/2023   TRIG 56 09/03/2023   CHOLHDL 2.1 09/03/2023    Lab Results  Component Value Date   HGBA1C 5.7 (H) 09/04/2023   No results for input(s): TSH, T4TOTAL, FREET4,  T3FREE, THYROIDAB in the last 72 hours. No results for input(s): VITAMINB12, FOLATE, FERRITIN, TIBC, IRON , RETICCTPCT in the last 72 hours.    Micro Results No results found for this or any previous visit (from the past 240 hours).   Radiology Report No results found.     Signature  -   Brayton Lye M.D on 12/27/2023 at 11:56 AM   -  To page go to [email or web address]

## 2023-12-27 NOTE — Progress Notes (Signed)
 [facility] Liaison Note   Previously received request from Transitions of Care Manager for patient interest in [facility].  Visited patient at bedside and she was resting with eyes closed. Spoke with nurse caring for patient and was told that she has been pretty comfortable thus far. Received 1 dose of zofran  for nausea today thus far and has been comfortable. Eligibility remains pending.  ACC will continue to reassess for inpatient unit appropriateness should patient decline or have uncontrolled symptom management. TOC notified via epic chat.   Thank you for the opportunity to participate in this patient's care.    Nat Babe, BSN, [facility] Liaison [phone]

## 2023-12-27 NOTE — Plan of Care (Signed)
 Problem: Education: Goal: Ability to describe self-care measures that may prevent or decrease complications (Diabetes Survival Skills Education) will improve Outcome: Progressing Goal: Individualized Educational Video(s) Outcome: Progressing   Problem: Coping: Goal: Ability to adjust to condition or change in health will improve Outcome: Progressing   Problem: Fluid Volume: Goal: Ability to maintain a balanced intake and output will improve Outcome: Progressing   Problem: Health Behavior/Discharge Planning: Goal: Ability to identify and utilize available resources and services will improve Outcome: Progressing Goal: Ability to manage health-related needs will improve Outcome: Progressing   Problem: Metabolic: Goal: Ability to maintain appropriate glucose levels will improve Outcome: Progressing   Problem: Nutritional: Goal: Maintenance of adequate nutrition will improve Outcome: Progressing Goal: Progress toward achieving an optimal weight will improve Outcome: Progressing   Problem: Skin Integrity: Goal: Risk for impaired skin integrity will decrease Outcome: Progressing   Problem: Tissue Perfusion: Goal: Adequacy of tissue perfusion will improve Outcome: Progressing   Problem: Education: Goal: Knowledge of General Education information will improve Description: Including pain rating scale, medication(s)/side effects and non-pharmacologic comfort measures Outcome: Progressing   Problem: Health Behavior/Discharge Planning: Goal: Ability to manage health-related needs will improve Outcome: Progressing   Problem: Clinical Measurements: Goal: Ability to maintain clinical measurements within normal limits will improve Outcome: Progressing Goal: Will remain free from infection Outcome: Progressing Goal: Diagnostic test results will improve Outcome: Progressing Goal: Respiratory complications will improve Outcome: Progressing Goal: Cardiovascular complication will  be avoided Outcome: Progressing   Problem: Activity: Goal: Risk for activity intolerance will decrease Outcome: Progressing   Problem: Nutrition: Goal: Adequate nutrition will be maintained Outcome: Progressing   Problem: Coping: Goal: Level of anxiety will decrease Outcome: Progressing   Problem: Elimination: Goal: Will not experience complications related to bowel motility Outcome: Progressing Goal: Will not experience complications related to urinary retention Outcome: Progressing   Problem: Pain Managment: Goal: General experience of comfort will improve and/or be controlled Outcome: Progressing   Problem: Safety: Goal: Ability to remain free from injury will improve Outcome: Progressing   Problem: Skin Integrity: Goal: Risk for impaired skin integrity will decrease Outcome: Progressing   Problem: Education: Goal: Knowledge of disease and its progression will improve Outcome: Progressing   Problem: Health Behavior/Discharge Planning: Goal: Ability to manage health-related needs will improve Outcome: Progressing   Problem: Clinical Measurements: Goal: Complications related to the disease process or treatment will be avoided or minimized Outcome: Progressing Goal: Dialysis access will remain free of complications Outcome: Progressing   Problem: Activity: Goal: Activity intolerance will improve Outcome: Progressing   Problem: Fluid Volume: Goal: Fluid volume balance will be maintained or improved Outcome: Progressing   Problem: Nutritional: Goal: Ability to make appropriate dietary choices will improve Outcome: Progressing   Problem: Respiratory: Goal: Respiratory symptoms related to disease process will be avoided Outcome: Progressing   Problem: Self-Concept: Goal: Body image disturbance will be avoided or minimized Outcome: Progressing   Problem: Urinary Elimination: Goal: Progression of disease will be identified and treated Outcome:  Progressing   Problem: Education: Goal: Knowledge of the prescribed therapeutic regimen will improve Outcome: Progressing   Problem: Coping: Goal: Ability to identify and develop effective coping behavior will improve Outcome: Progressing   Problem: Clinical Measurements: Goal: Quality of life will improve Outcome: Progressing   Problem: Respiratory: Goal: Verbalizations of increased ease of respirations will increase Outcome: Progressing   Problem: Role Relationship: Goal: Family's ability to cope with current situation will improve Outcome: Progressing Goal: Ability to verbalize concerns, feelings, and thoughts  to partner or family member will improve Outcome: Progressing   Problem: Pain Management: Goal: Satisfaction with pain management regimen will improve Outcome: Progressing

## 2023-12-27 NOTE — Progress Notes (Signed)
 Daily Progress Note   Patient Name: [name]       Date: 12/27/2023 DOB: 03-13-1956  Age: 68 y.o. MRN#: [id number] Attending Physician: Sherlon Brayton RAMAN, MD Primary Care Physician: Valma Carwin, MD Admit Date: 12/15/2023  Reason for Consultation/Follow-up: Establishing goals of care  Subjective: Medical records reviewed including progress notes, MAR. Patient assessed at the bedside. She received 2 doses of PRN dilaudid  and 2 doses of PRN Zofran . She reports good relief from PRN dilaudid  and zofran  that has been administered. We discussed continuing medications PRN rather than scheduling and she is agreeable. Her sister has visited and her brother will be visiting after he is finished with the roof on the house he is building. She looks forward to [place] and hopes she will be able to go outside in a wheelchair to see the neighborhood she grew up in. Emotional support and therapeutic listening was provided as she reflected on her life and her hope to see her dogs again when she has passed on to [place].  Questions and concerns addressed. PMT will continue to support holistically.   Length of Stay: 10   Physical Exam Vitals and nursing note reviewed.  Constitutional:      General: She is not in acute distress.    Appearance: She is ill-appearing.  HENT:     Head: Normocephalic and atraumatic.  Cardiovascular:     Rate and Rhythm: Bradycardia present.  Pulmonary:     Effort: Pulmonary effort is normal.  Skin:    General: Skin is warm and dry.  Neurological:     Mental Status: She is alert and oriented to person, place, and time.  Psychiatric:        Mood and Affect: Mood normal.        Behavior: Behavior normal.             Vital Signs: BP (!) 138/50 (BP Location: Left Wrist)   Pulse (!) 50   Temp 98.4 F (36.9 C)  (Oral)   Resp 16   Ht 5' 1 (1.549 m)   Wt 132.5 kg   SpO2 99%   BMI 55.19 kg/m  SpO2: SpO2: 99 % O2 Device: O2 Device: Nasal Cannula O2 Flow Rate: O2 Flow Rate (L/min): 3 L/min      Palliative Assessment/Data: 30%   Palliative Care Assessment & Plan   Patient Profile: 68 y.o. female  with past medical history of pulmonary hypertension, chronic respiratory failure requiring 2L [place] at baseline, Afib on Eliquis , HFpEF (EF 60-65% in 08/2023), DM2, DVT/PE x3 s/p IVC filter, CKD stage IV, and morbid obesity  admitted on 12/15/2023 with shortness of breath and chest pain.    In the ED, patient was found to be in volume overload secondary to left lower lobe pneumonia and HFpEF exacerbation with worsening renal failure.  Dialysis was started on 9/15.  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  Oncology has evaluated and recommended consideration of hospice given her poor functional status and not a good candidate for chemotherapy.   PMT has been consulted to assist with goals of care conversation.  Assessment: End of life care  Recommendations/Plan: Continue DNR/DNI Continue comfort focused care, no adjustments required today Patient remains stable for hospice facility when a bed is available PMT will continue to follow and support   Prognosis: < 2 weeks would not be surprising given discontinuation of dialysis and poor oral intake in the setting of metastatic cancer   Discharge Planning: Hospice facility  Care plan was discussed with Patient          Inaara Tye SHAUNNA Fell, PA-C  Palliative Medicine Team Team phone # [phone]  Thank you for allowing the Palliative Medicine Team to assist in the care of this patient. Please utilize secure chat with additional questions, if there is no response within 30 minutes please call the above phone number.  Palliative Medicine Team providers are  available by phone from 7am to 7pm daily and can be reached through the team cell phone.  Should this patient require assistance outside of these hours, please call the patient's attending physician.     Time Total: 25  Visit consisted of counseling and education dealing with the complex and emotionally intense issues of symptom management and palliative care in the setting of serious and potentially life-threatening illness. Greater than 50% of this time was spent counseling and coordinating care related to the above assessment and plan.  Personally spent 25 minutes in patient care including extensive chart review (labs, imaging, progress/consult notes, vital signs), medically appropraite exam, discussed with treatment team, education to patient, family, and staff, documenting clinical information, medication review and management, coordination of care, and available advanced directive documents.

## 2023-12-27 NOTE — TOC Progression Note (Addendum)
 Transition of Care [facility]) - Progression Note    Patient Details  Name: [name] MRN: [id number] Date of Birth: 1955/08/03  Transition of Care [facility]) CM/SW Contact  Kreg Earhart E Betul Brisky, LCSW Phone Number: 12/27/2023, 8:23 AM  Clinical Narrative:    Reached out to Nat with [facility] to follow up on [facility] referral. Per Nat, awaiting bed availability at [facility].   Expected Discharge Plan: Hospice Medical Facility Barriers to Discharge: No Barriers Identified               Expected Discharge Plan and Services In-house Referral: Hospice / Palliative Care   Post Acute Care Choice: Residential Hospice Bed Living arrangements for the past 2 months: Single Family Home                                       Social Drivers of Health (SDOH) Interventions SDOH Screenings   Food Insecurity: No Food Insecurity (12/16/2023)  Housing: Low Risk  (12/16/2023)  Recent Concern: Housing - High Risk (11/05/2023)  Transportation Needs: No Transportation Needs (12/16/2023)  Utilities: Not At Risk (12/16/2023)  Alcohol  Screen: Low Risk  (11/05/2023)  Social Connections: Moderately Isolated (12/16/2023)  Tobacco Use: Medium Risk (12/20/2023)    Readmission Risk Interventions     No data to display

## 2023-12-28 DIAGNOSIS — C787 Secondary malignant neoplasm of liver and intrahepatic bile duct: Secondary | ICD-10-CM | POA: Diagnosis not present

## 2023-12-28 DIAGNOSIS — Z515 Encounter for palliative care: Secondary | ICD-10-CM | POA: Diagnosis not present

## 2023-12-28 DIAGNOSIS — N179 Acute kidney failure, unspecified: Secondary | ICD-10-CM | POA: Diagnosis not present

## 2023-12-28 DIAGNOSIS — N185 Chronic kidney disease, stage 5: Secondary | ICD-10-CM | POA: Diagnosis not present

## 2023-12-28 DIAGNOSIS — J189 Pneumonia, unspecified organism: Secondary | ICD-10-CM | POA: Diagnosis not present

## 2023-12-28 NOTE — Progress Notes (Signed)
 [facility] Liaison Note   Previously received request from Transitions of Care Manager for patient interest in [facility].   Visited patient at bedside and she was alert and oriented x3. She received 1 dose of phenergan  for nausea.  Other symptoms appear well managed at this time.  Eligibility remains pending.  ACC will continue to reassess for inpatient unit appropriateness should patient decline or have uncontrolled symptom management. TOC notified via epic chat.   Thank you for the opportunity to participate in this patient's care.  Inocente Jacobs BSN RN [facility] Liaison [phone]

## 2023-12-28 NOTE — TOC Progression Note (Signed)
 Transition of Care [facility]) - Progression Note    Patient Details  Name: [name] MRN: [id number] Date of Birth: [birth year]  Transition of [facility]) CM/SW Contact  Inocente GORMAN Kindle, LCSW Phone Number: 12/28/2023, 1:26 PM  Clinical Narrative:    Awaiting acceptance at [facility].    Expected Discharge Plan: Hospice Medical Facility Barriers to Discharge: No Barriers Identified               Expected Discharge Plan and Services In-house Referral: Hospice / Palliative Care   Post Acute Care Choice: Residential Hospice Bed Living arrangements for the past 2 months: Single Family Home                                       Social Drivers of Health (SDOH) Interventions SDOH Screenings   Food Insecurity: No Food Insecurity (12/16/2023)  Housing: Low Risk  (12/16/2023)  Recent Concern: Housing - High Risk (11/05/2023)  Transportation Needs: No Transportation Needs (12/16/2023)  Utilities: Not At Risk (12/16/2023)  Alcohol  Screen: Low Risk  (11/05/2023)  Social Connections: Moderately Isolated (12/16/2023)  Tobacco Use: Medium Risk (12/20/2023)    Readmission Risk Interventions     No data to display

## 2023-12-28 NOTE — Progress Notes (Signed)
 PROGRESS NOTE                                                                                                                                                                                                             Patient Demographics:    [name], is a 68 y.o. female, DOB - 11/29/1955, FMW:[id number]  Outpatient Primary MD for the patient is Valma Carwin, MD    LOS - 11  Admit date - 12/15/2023    Chief Complaint  Patient presents with   Chest Pain       Brief Narrative (HPI from H&P)       68 y.o. female long-term resident at SNF with medical history significant of pulmonary hypertension, chronic respiratory failure requiring 2L [place] at baseline, Afib on Eliquis , HFpEF (EF 60-65% in 08/2023), DM2 (A1c 5.7 in 08/2023), DVT/PE x3 s/p IVC filter in 09/2009, CKD stage IV, and morbid obesity p/w SOB/cp iso volume overload 2/2 LLL CAP and HFpEF exacerbation c/b worsening renal failure and found to have liver lesions c/f metastases.  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 -Her poor functional status, multiorgan failure she is very poor candidate for any systemic treatments, and recommendation is to pursue hospice, I have discussed with the patient, she is in agreement, and as discussed with her she reports she would like to stop her  dialysis as well and to pursue hospice, palliative medicine were consulted and she currently transitioned to full comfort measures   Dyspnea/chronic hypoxic respiratory failure - Multifactorial, due to CHF and pneumonia, and pulmonary hypertension  Acute on chronic diastolic CHF  -Volume management with dialysis -Making urine, remains on IV diuresis HFpEF EF 60% on recent echocardiogram recent [facility] noted no significant CAD, PA pressure 62/16, wedge of 11, PVR 4.4, group 3 pulmonary hypertension suspected, Diuresis  per nephrology. - Now patient comfort measures, with no dialysis, only symptomatic management.    Left lower lobe community-acquired pneumonia - Treated with antibiotics    Citrobacter UTI - receved 3 days of IV cefepime   H/o pulmonary HTN (group 3 suspected) H/o morbid obesity supportive care.   -BMI of 57  Paroxysmal Afib   -anticoagulation currently heparin  drip post biopsy will resume once cleared by IR, long-term on Eliquis .,  Back on Eliquis .   Hypothyroid  -PTA Synthroid    HTN  -PTA amlodipine    Constipation with nausea.  Last BM per patient now over 4 weeks ago, on [place] bowel regimen including Colace, MiraLAX , Movantik , Dulcolax, lactulose .  Also received enema on 12/21/2023  Chronic lymphedema - Venous stasis ulcer of LLE  - Unna boots   MDD  -PTA Abilify , Celexa , and Valium   DM2 - She is now comfort Lab Results  Component Value Date   HGBA1C 5.7 (H) 09/04/2023   .CBG (last 3)  Recent Labs    12/25/23 1142  GLUCAP 113*   Goals of care End-of-life care - Palliative medicine input greatly appreciated, at this point she is a full comfort measure, dialysis has been stopped, plan for residential hospice placement when bed is available     Condition - Extremely Guarded  Family Communication  : Discussed with patient, all of her questions answered  Code Status : Comfort Consults  :  Renal, IR, oncology, palliative  PUD Prophylaxis :      Procedures  :     CT-guided liver biopsy due now on 12/22/2023.    Right IJ HD catheter placed by IR on 12/21/2023.    CT Liver - 1. Hypoattenuating liver lesions with peripheral enhancement, largest measuring 7.9 cm in the inferior right hepatic lobe concerning for metastases. Differential consideration includes hepatic abscesses. Hepatic protocol MRI with and without IV contrast is recommended. 2. Small right pleural effusion and RLL pneumonia or atelectasis.      Disposition Plan  :    Status is: Inpatient   DVT Prophylaxis  : Comfort care   Lab Results  Component Value Date   PLT 133 (L) 12/25/2023    Diet :  Diet Order             Diet regular Room service appropriate? Yes; Fluid consistency: Thin  Diet effective now                    Inpatient Medications  Scheduled Meds:  acetaminophen   1,000 mg Oral TID   ARIPiprazole   2 mg Oral QHS   Chlorhexidine  Gluconate Cloth  6 each Topical Q0600   citalopram   20 mg Oral QHS   melatonin  10 mg Oral QHS   polyethylene glycol  17 g Oral BID   Continuous Infusions:  promethazine  (PHENERGAN ) injection (IM or IVPB) Stopped (12/20/23 2358)   PRN Meds:.antiseptic oral rinse, artificial tears, bisacodyl , diphenhydrAMINE , glycopyrrolate  **OR** glycopyrrolate  **OR** glycopyrrolate , haloperidol  **OR** haloperidol  **OR** haloperidol  lactate, HYDROmorphone  (DILAUDID ) injection, LORazepam  **OR** LORazepam  **OR** LORazepam , ondansetron  (ZOFRAN ) IV, mouth rinse, promethazine  (PHENERGAN ) injection (IM or IVPB), senna-docusate    Objective:   Vitals:   12/26/23 1939 12/27/23 1615 12/27/23 1956 12/28/23 0700  BP: (!) 138/50 (!) 124/44 (!) 125/49 (!) 142/43  Pulse: (!) 50 (!) 52 (!) 56   Resp:  18    Temp: 98.4 F (36.9 C) 98 F (36.7 C) 98.2 F (36.8 C) 98 F (36.7 C)  TempSrc: Oral Oral Oral Oral  SpO2:  98% 100%   Weight:      Height:        Wt Readings from Last 3 Encounters:  12/24/23 132.5 kg  12/05/23 (!) 137.2  kg  11/05/23 136.1 kg    No intake or output data in the 24 hours ending 12/28/23 0921      Physical Exam  Awake, alert, no apparent distress Good air entry Abdomen soft Foley present   Data Review:    Recent Labs  Lab 12/22/23 0324 12/23/23 0232 12/24/23 0637 12/25/23 0219  WBC 7.7 7.6 7.1 8.0  HGB 8.2* 9.1* 8.7* 9.2*  HCT 27.8* 30.2* 29.6* 30.7*  PLT 129* 142* 131* 133*  MCV 102.2* 103.1* 103.1* 103.4*  MCH 30.1 31.1 30.3 31.0  MCHC 29.5* 30.1 29.4* 30.0  RDW 17.0* 17.1* 17.0* 17.0*  LYMPHSABS 0.3*  --   --   --   MONOABS 0.6  --   --   --   EOSABS 0.1  --   --   --   BASOSABS 0.0  --   --   --     Recent Labs  Lab 12/22/23 0324 12/23/23 0232 12/24/23 0637 12/25/23 0219  NA 135 140 136 138  K 4.5 4.3 3.8 3.9  CL 100 99 96* 98  CO2 23 23 28 24   ANIONGAP 12 18* 12 16*  GLUCOSE 99 103* 85 110*  BUN 121* 84* 90* 60*  CREATININE 3.77* 3.08* 2.99* 2.25*  AST 22  --   --   --   ALT 14  --   --   --   ALKPHOS 70  --   --   --   BILITOT 0.4  --   --   --   ALBUMIN  2.7* 2.7* 2.7* 2.8*  PHOS  --  5.7* 5.6* 4.8*  CALCIUM  8.3* 8.6* 8.6* 8.6*      Recent Labs  Lab 12/22/23 0324 12/23/23 0232 12/24/23 0637 12/25/23 0219  CALCIUM  8.3* 8.6* 8.6* 8.6*    --------------------------------------------------------------------------------------------------------------- Lab Results  Component Value Date   CHOL 105 09/03/2023   HDL 50 09/03/2023   LDLCALC 44 09/03/2023   TRIG 56 09/03/2023   CHOLHDL 2.1 09/03/2023    Lab Results  Component Value Date   HGBA1C 5.7 (H) 09/04/2023   No results for input(s): TSH, T4TOTAL, FREET4, T3FREE, THYROIDAB in the last 72 hours. No results for input(s): VITAMINB12, FOLATE, FERRITIN, TIBC, IRON , RETICCTPCT in the last 72 hours.    Micro Results No results found for this or any previous visit (from the past 240 hours).   Radiology Report No results found.     Signature  -   Brayton Lye M.D on 12/28/2023 at 9:21 AM   -  To page go to [email or web address]

## 2023-12-28 NOTE — Plan of Care (Signed)
 Problem: Education: Goal: Ability to describe self-care measures that may prevent or decrease complications (Diabetes Survival Skills Education) will improve Outcome: Progressing Goal: Individualized Educational Video(s) Outcome: Progressing   Problem: Coping: Goal: Ability to adjust to condition or change in health will improve Outcome: Progressing   Problem: Fluid Volume: Goal: Ability to maintain a balanced intake and output will improve Outcome: Progressing   Problem: Health Behavior/Discharge Planning: Goal: Ability to identify and utilize available resources and services will improve Outcome: Progressing Goal: Ability to manage health-related needs will improve Outcome: Progressing   Problem: Metabolic: Goal: Ability to maintain appropriate glucose levels will improve Outcome: Progressing   Problem: Nutritional: Goal: Maintenance of adequate nutrition will improve Outcome: Progressing Goal: Progress toward achieving an optimal weight will improve Outcome: Progressing   Problem: Skin Integrity: Goal: Risk for impaired skin integrity will decrease Outcome: Progressing   Problem: Tissue Perfusion: Goal: Adequacy of tissue perfusion will improve Outcome: Progressing   Problem: Education: Goal: Knowledge of General Education information will improve Description: Including pain rating scale, medication(s)/side effects and non-pharmacologic comfort measures Outcome: Progressing   Problem: Health Behavior/Discharge Planning: Goal: Ability to manage health-related needs will improve Outcome: Progressing   Problem: Clinical Measurements: Goal: Ability to maintain clinical measurements within normal limits will improve Outcome: Progressing Goal: Will remain free from infection Outcome: Progressing Goal: Diagnostic test results will improve Outcome: Progressing Goal: Respiratory complications will improve Outcome: Progressing Goal: Cardiovascular complication will  be avoided Outcome: Progressing   Problem: Activity: Goal: Risk for activity intolerance will decrease Outcome: Progressing   Problem: Nutrition: Goal: Adequate nutrition will be maintained Outcome: Progressing   Problem: Coping: Goal: Level of anxiety will decrease Outcome: Progressing   Problem: Elimination: Goal: Will not experience complications related to bowel motility Outcome: Progressing Goal: Will not experience complications related to urinary retention Outcome: Progressing   Problem: Pain Managment: Goal: General experience of comfort will improve and/or be controlled Outcome: Progressing   Problem: Safety: Goal: Ability to remain free from injury will improve Outcome: Progressing   Problem: Skin Integrity: Goal: Risk for impaired skin integrity will decrease Outcome: Progressing   Problem: Education: Goal: Knowledge of disease and its progression will improve Outcome: Progressing   Problem: Health Behavior/Discharge Planning: Goal: Ability to manage health-related needs will improve Outcome: Progressing   Problem: Clinical Measurements: Goal: Complications related to the disease process or treatment will be avoided or minimized Outcome: Progressing Goal: Dialysis access will remain free of complications Outcome: Progressing   Problem: Activity: Goal: Activity intolerance will improve Outcome: Progressing   Problem: Fluid Volume: Goal: Fluid volume balance will be maintained or improved Outcome: Progressing   Problem: Nutritional: Goal: Ability to make appropriate dietary choices will improve Outcome: Progressing   Problem: Respiratory: Goal: Respiratory symptoms related to disease process will be avoided Outcome: Progressing   Problem: Self-Concept: Goal: Body image disturbance will be avoided or minimized Outcome: Progressing   Problem: Urinary Elimination: Goal: Progression of disease will be identified and treated Outcome:  Progressing   Problem: Education: Goal: Knowledge of the prescribed therapeutic regimen will improve Outcome: Progressing   Problem: Coping: Goal: Ability to identify and develop effective coping behavior will improve Outcome: Progressing   Problem: Clinical Measurements: Goal: Quality of life will improve Outcome: Progressing   Problem: Respiratory: Goal: Verbalizations of increased ease of respirations will increase Outcome: Progressing   Problem: Role Relationship: Goal: Family's ability to cope with current situation will improve Outcome: Progressing Goal: Ability to verbalize concerns, feelings, and thoughts  to partner or family member will improve Outcome: Progressing   Problem: Pain Management: Goal: Satisfaction with pain management regimen will improve Outcome: Progressing

## 2023-12-28 NOTE — Progress Notes (Signed)
 Daily Progress Note   Patient Name: [name]       Date: 12/28/2023 DOB: January 14, 1956  Age: 68 y.o. MRN#: [id number] Attending Physician: Sherlon Brayton RAMAN, MD Primary Care Physician: Valma Carwin, MD Admit Date: 12/15/2023  Reason for Consultation/Follow-up: Establishing goals of care  Subjective: Medical records reviewed including progress notes, MAR. Patient assessed at the bedside. She was sleeping comfortably. Did not attempt to arouse in order to preserve her comfort. She received 1 dose of PRN dilaudid  and 2 doses of PRN Zofran  in the past 24 hours. No visitors were present.  Questions and concerns addressed. PMT will continue to support holistically.   Length of Stay: 11   Physical Exam Vitals and nursing note reviewed.  Constitutional:      General: She is sleeping. She is not in acute distress.    Appearance: She is ill-appearing.  HENT:     Head: Normocephalic and atraumatic.  Cardiovascular:     Rate and Rhythm: Bradycardia present.  Pulmonary:     Effort: Pulmonary effort is normal.  Skin:    General: Skin is warm and dry.  Psychiatric:        Mood and Affect: Mood normal.        Behavior: Behavior normal.             Vital Signs: BP (!) 142/43 (BP Location: Left Arm)   Pulse (!) 56   Temp 98 F (36.7 C) (Oral)   Resp 18   Ht 5' 1 (1.549 m)   Wt 132.5 kg   SpO2 100%   BMI 55.19 kg/m  SpO2: SpO2: 100 % O2 Device: O2 Device: Nasal Cannula O2 Flow Rate: O2 Flow Rate (L/min): 3 L/min      Palliative Assessment/Data: 30%   Palliative Care Assessment & Plan   Patient Profile: 68 y.o. female  with past medical history of pulmonary hypertension, chronic respiratory failure requiring 2L [place] at baseline, Afib on Eliquis , HFpEF (EF 60-65% in 08/2023), DM2, DVT/PE x3 s/p IVC filter, CKD stage IV, and  morbid obesity admitted on 12/15/2023 with shortness of breath and chest pain.    In the ED, patient was found to be in volume overload secondary to left lower lobe pneumonia and HFpEF exacerbation with worsening renal failure.  Dialysis was started on 9/15.  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  Oncology has evaluated and recommended consideration of hospice given her poor functional status and not a good candidate for chemotherapy.   PMT has been consulted to assist with goals  of care conversation.  Assessment: End of life care  Recommendations/Plan: Continue DNR/DNI Continue comfort focused care, no adjustments required today Patient remains stable for hospice facility when appropriate. She reports that her SNF has already given her belongings to her family PMT will continue to follow and support   Prognosis: < 2 weeks would not be surprising given discontinuation of dialysis and poor oral intake in the setting of metastatic cancer   Discharge Planning: Hospice facility    Zaydon Kinser SHAUNNA Fell, PA-C  Palliative Medicine Team Team phone # [phone]  Thank you for allowing the Palliative Medicine Team to assist in the care of this patient. Please utilize secure chat with additional questions, if there is no response within 30 minutes please call the above phone number.  Palliative Medicine Team providers are available by phone from 7am to 7pm daily and can be reached through the team cell phone.  Should this patient require assistance outside of these hours, please call the patient's attending physician.     Time Total: 25  Visit consisted of counseling and education dealing with the complex and emotionally intense issues of symptom management and palliative care in the setting of serious and potentially life-threatening illness. Greater than 50% of this time was spent counseling and  coordinating care related to the above assessment and plan.  Personally spent 25 minutes in patient care including extensive chart review (labs, imaging, progress/consult notes, vital signs), medically appropraite exam, discussed with treatment team, education to patient, family, and staff, documenting clinical information, medication review and management, coordination of care, and available advanced directive documents.

## 2023-12-29 DIAGNOSIS — J189 Pneumonia, unspecified organism: Secondary | ICD-10-CM | POA: Diagnosis not present

## 2023-12-29 DIAGNOSIS — R110 Nausea: Secondary | ICD-10-CM

## 2023-12-29 DIAGNOSIS — N179 Acute kidney failure, unspecified: Secondary | ICD-10-CM | POA: Diagnosis not present

## 2023-12-29 DIAGNOSIS — K769 Liver disease, unspecified: Secondary | ICD-10-CM | POA: Diagnosis not present

## 2023-12-29 DIAGNOSIS — Z515 Encounter for palliative care: Secondary | ICD-10-CM | POA: Diagnosis not present

## 2023-12-29 DIAGNOSIS — N185 Chronic kidney disease, stage 5: Secondary | ICD-10-CM | POA: Diagnosis not present

## 2023-12-29 LAB — GLUCOSE, CAPILLARY: Glucose-Capillary: 189 mg/dL — ABNORMAL HIGH (ref 70–99)

## 2023-12-29 MED ORDER — SODIUM CHLORIDE 0.9 % IV SOLN
8.0000 mg | Freq: Three times a day (TID) | INTRAVENOUS | Status: DC
  Administered 2023-12-29 – 2023-12-31 (×6): 8 mg via INTRAVENOUS
  Filled 2023-12-29 (×8): qty 4

## 2023-12-29 NOTE — Progress Notes (Signed)
 PROGRESS NOTE                                                                                                                                                                                                             Patient Demographics:    [name], is a 69 y.o. female, DOB - 1956/01/24, FMW:[id number]  Outpatient Primary MD for the patient is Valma Carwin, MD    LOS - 12  Admit date - 12/15/2023    Chief Complaint  Patient presents with   Chest Pain       Brief Narrative (HPI from H&P)       69 y.o. female long-term resident at SNF with medical history significant of pulmonary hypertension, chronic respiratory failure requiring 2L [place] at baseline, Afib on Eliquis , HFpEF (EF 60-65% in 08/2023), DM2 (A1c 5.7 in 08/2023), DVT/PE x3 s/p IVC filter in 09/2009, CKD stage IV, and morbid obesity p/w SOB/cp iso volume overload 2/2 LLL CAP and HFpEF exacerbation c/b worsening renal failure and found to have liver lesions c/f metastases.  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 -Her poor functional status, multiorgan failure she is very poor candidate for any systemic treatments, and recommendation is to pursue hospice, I have discussed with the patient, she is in agreement, and as discussed with her she reports she would like to stop her  dialysis as well and to pursue hospice, palliative medicine were consulted and she currently transitioned to full comfort measures   Dyspnea/chronic hypoxic respiratory failure - Multifactorial, due to CHF and pneumonia, and pulmonary hypertension  Acute on chronic diastolic CHF  -Volume management with dialysis -Making urine, remains on IV diuresis HFpEF EF 60% on recent echocardiogram recent [facility] noted no significant CAD, PA pressure 62/16, wedge of 11, PVR 4.4, group 3 pulmonary hypertension suspected, Diuresis  per nephrology. - Now patient comfort measures, with no dialysis, only symptomatic management.    Left lower lobe community-acquired pneumonia - Treated with antibiotics    Citrobacter UTI - receved 3 days of IV cefepime   H/o pulmonary HTN (group 3 suspected) H/o morbid obesity supportive care.   -BMI of 57  Paroxysmal Afib   -anticoagulation currently heparin  drip post biopsy will resume once cleared by IR, long-term on Eliquis .,  Back on Eliquis .   Hypothyroid  -PTA Synthroid    HTN  -PTA amlodipine    Constipation with nausea.  Last BM per patient now over 4 weeks ago, on [place] bowel regimen including Colace, MiraLAX , Movantik , Dulcolax, lactulose .  Also received enema on 12/21/2023  Chronic lymphedema - Venous stasis ulcer of LLE  - Unna boots   MDD  -PTA Abilify , Celexa , and Valium   DM2 - She is now comfort Lab Results  Component Value Date   HGBA1C 5.7 (H) 09/04/2023   .CBG (last 3)  No results for input(s): GLUCAP in the last 72 hours.  Goals of care End-of-life care - Palliative medicine input greatly appreciated, at this point she is a full comfort measure, dialysis has been stopped, plan for residential hospice placement when bed is available     Condition - Extremely Guarded  Family Communication  : Discussed with patient, all of her questions answered  Code Status : Comfort Consults  :  Renal, IR, oncology, palliative  PUD Prophylaxis :      Procedures  :     CT-guided liver biopsy due now on 12/22/2023.    Right IJ HD catheter placed by IR on 12/21/2023.    CT Liver - 1. Hypoattenuating liver lesions with peripheral enhancement, largest measuring 7.9 cm in the inferior right hepatic lobe concerning for metastases. Differential consideration includes hepatic abscesses. Hepatic protocol MRI with and without IV contrast is recommended. 2. Small right pleural effusion and RLL pneumonia or atelectasis.      Disposition Plan  :    Status is: Inpatient   DVT Prophylaxis  : Comfort care   Lab Results  Component Value Date   PLT 133 (L) 12/25/2023    Diet :  Diet Order             Diet regular Room service appropriate? Yes; Fluid consistency: Thin  Diet effective now                    Inpatient Medications  Scheduled Meds:  acetaminophen   1,000 mg Oral TID   ARIPiprazole   2 mg Oral QHS   Chlorhexidine  Gluconate Cloth  6 each Topical Q0600   citalopram   20 mg Oral QHS   melatonin  10 mg Oral QHS   polyethylene glycol  17 g Oral BID   Continuous Infusions:  promethazine  (PHENERGAN ) injection (IM or IVPB) 12.5 mg (12/29/23 0823)   PRN Meds:.antiseptic oral rinse, artificial tears, bisacodyl , diphenhydrAMINE , glycopyrrolate  **OR** glycopyrrolate  **OR** glycopyrrolate , haloperidol  **OR** haloperidol  **OR** haloperidol  lactate, HYDROmorphone  (DILAUDID ) injection, LORazepam  **OR** LORazepam  **OR** LORazepam , ondansetron  (ZOFRAN ) IV, mouth rinse, promethazine  (PHENERGAN ) injection (IM or IVPB), senna-docusate    Objective:   Vitals:   12/27/23 1956 12/28/23 0700 12/28/23 2228 12/29/23 0723  BP: (!) 125/49 (!) 142/43 (!) 154/87 (!) 145/54  Pulse: (!) 56  62 (!) 50  Resp:   16 16  Temp: 98.2 F (36.8 C) 98 F (36.7 C) 97.7 F (36.5 C) 98.4 F (36.9 C)  TempSrc: Oral Oral Oral Oral  SpO2: 100%  100% 100%  Weight:      Height:        Wt Readings from Last 3 Encounters:  12/24/23 132.5 kg  12/05/23  (!) 137.2 kg  11/05/23 136.1 kg     Intake/Output Summary (Last 24 hours) at 12/29/2023 1033 Last data filed at 12/29/2023 0726 Gross per 24 hour  Intake 0 ml  Output 600 ml  Net -600 ml        Physical Exam  Awake, alert, no apparent distress  Good air entry  Abdomen soft  Lower extremities wrapped Foley present    Data Review:    Recent Labs  Lab 12/23/23 0232 12/24/23 0637 12/25/23 0219  WBC 7.6 7.1 8.0  HGB 9.1* 8.7* 9.2*  HCT 30.2* 29.6* 30.7*  PLT 142* 131* 133*  MCV 103.1* 103.1* 103.4*  MCH 31.1 30.3 31.0  MCHC 30.1 29.4* 30.0  RDW 17.1* 17.0* 17.0*    Recent Labs  Lab 12/23/23 0232 12/24/23 0637 12/25/23 0219  NA 140 136 138  K 4.3 3.8 3.9  CL 99 96* 98  CO2 23 28 24   ANIONGAP 18* 12 16*  GLUCOSE 103* 85 110*  BUN 84* 90* 60*  CREATININE 3.08* 2.99* 2.25*  ALBUMIN  2.7* 2.7* 2.8*  PHOS 5.7* 5.6* 4.8*  CALCIUM  8.6* 8.6* 8.6*      Recent Labs  Lab 12/23/23 0232 12/24/23 0637 12/25/23 0219  CALCIUM  8.6* 8.6* 8.6*    --------------------------------------------------------------------------------------------------------------- Lab Results  Component Value Date   CHOL 105 09/03/2023   HDL 50 09/03/2023   LDLCALC 44 09/03/2023   TRIG 56 09/03/2023   CHOLHDL 2.1 09/03/2023    Lab Results  Component Value Date   HGBA1C 5.7 (H) 09/04/2023   No results for input(s): TSH, T4TOTAL, FREET4, T3FREE, THYROIDAB in the last 72 hours. No results for input(s): VITAMINB12, FOLATE, FERRITIN, TIBC, IRON , RETICCTPCT in the last 72 hours.    Micro Results No results found for this or any previous visit (from the past 240 hours).   Radiology Report No results found.     Signature  -   Brayton Lye M.D on 12/29/2023 at 10:33 AM   -  To page go to [email or web address]

## 2023-12-29 NOTE — Plan of Care (Signed)
 Problem: Education: Goal: Ability to describe self-care measures that may prevent or decrease complications (Diabetes Survival Skills Education) will improve Outcome: Progressing Goal: Individualized Educational Video(s) Outcome: Progressing   Problem: Coping: Goal: Ability to adjust to condition or change in health will improve Outcome: Progressing   Problem: Fluid Volume: Goal: Ability to maintain a balanced intake and output will improve Outcome: Progressing   Problem: Health Behavior/Discharge Planning: Goal: Ability to identify and utilize available resources and services will improve Outcome: Progressing Goal: Ability to manage health-related needs will improve Outcome: Progressing   Problem: Metabolic: Goal: Ability to maintain appropriate glucose levels will improve Outcome: Progressing   Problem: Nutritional: Goal: Maintenance of adequate nutrition will improve Outcome: Progressing Goal: Progress toward achieving an optimal weight will improve Outcome: Progressing   Problem: Skin Integrity: Goal: Risk for impaired skin integrity will decrease Outcome: Progressing   Problem: Tissue Perfusion: Goal: Adequacy of tissue perfusion will improve Outcome: Progressing   Problem: Education: Goal: Knowledge of General Education information will improve Description: Including pain rating scale, medication(s)/side effects and non-pharmacologic comfort measures Outcome: Progressing   Problem: Health Behavior/Discharge Planning: Goal: Ability to manage health-related needs will improve Outcome: Progressing   Problem: Clinical Measurements: Goal: Ability to maintain clinical measurements within normal limits will improve Outcome: Progressing Goal: Will remain free from infection Outcome: Progressing Goal: Diagnostic test results will improve Outcome: Progressing Goal: Respiratory complications will improve Outcome: Progressing Goal: Cardiovascular complication will  be avoided Outcome: Progressing   Problem: Activity: Goal: Risk for activity intolerance will decrease Outcome: Progressing   Problem: Nutrition: Goal: Adequate nutrition will be maintained Outcome: Progressing   Problem: Coping: Goal: Level of anxiety will decrease Outcome: Progressing   Problem: Elimination: Goal: Will not experience complications related to bowel motility Outcome: Progressing Goal: Will not experience complications related to urinary retention Outcome: Progressing   Problem: Pain Managment: Goal: General experience of comfort will improve and/or be controlled Outcome: Progressing   Problem: Safety: Goal: Ability to remain free from injury will improve Outcome: Progressing   Problem: Skin Integrity: Goal: Risk for impaired skin integrity will decrease Outcome: Progressing   Problem: Education: Goal: Knowledge of disease and its progression will improve Outcome: Progressing   Problem: Health Behavior/Discharge Planning: Goal: Ability to manage health-related needs will improve Outcome: Progressing   Problem: Clinical Measurements: Goal: Complications related to the disease process or treatment will be avoided or minimized Outcome: Progressing Goal: Dialysis access will remain free of complications Outcome: Progressing   Problem: Activity: Goal: Activity intolerance will improve Outcome: Progressing   Problem: Fluid Volume: Goal: Fluid volume balance will be maintained or improved Outcome: Progressing   Problem: Nutritional: Goal: Ability to make appropriate dietary choices will improve Outcome: Progressing   Problem: Respiratory: Goal: Respiratory symptoms related to disease process will be avoided Outcome: Progressing   Problem: Self-Concept: Goal: Body image disturbance will be avoided or minimized Outcome: Progressing   Problem: Urinary Elimination: Goal: Progression of disease will be identified and treated Outcome:  Progressing   Problem: Education: Goal: Knowledge of the prescribed therapeutic regimen will improve Outcome: Progressing   Problem: Coping: Goal: Ability to identify and develop effective coping behavior will improve Outcome: Progressing   Problem: Clinical Measurements: Goal: Quality of life will improve Outcome: Progressing   Problem: Respiratory: Goal: Verbalizations of increased ease of respirations will increase Outcome: Progressing   Problem: Role Relationship: Goal: Family's ability to cope with current situation will improve Outcome: Progressing Goal: Ability to verbalize concerns, feelings, and thoughts  to partner or family member will improve Outcome: Progressing   Problem: Pain Management: Goal: Satisfaction with pain management regimen will improve Outcome: Progressing

## 2023-12-29 NOTE — Progress Notes (Signed)
 Daily Progress Note   Patient Name: [name]       Date: 12/29/2023 DOB: 1955-10-15  Age: 68 y.o. MRN#: [id number] Attending Physician: Sherlon Brayton RAMAN, MD Primary Care Physician: Valma Carwin, MD Admit Date: 12/15/2023  Reason for Consultation/Follow-up: Establishing goals of care  Subjective: Medical records reviewed including progress notes, MAR. Patient assessed at the bedside. She was sleeping comfortably. Did not attempt to arouse in order to preserve her comfort. She received 2 doses of PRN dilaudid , 2 doses of PRN Zofran , and 1 infusion of Phenergan  in the past 24 hours. No visitors were present.  Patient notes nausea has been increasing and she tries to hold off on medications for as long as she can. Discussed option of scheduling Zofran  three times daily and she is agreeable. She feels PRN pain medications remain effective and no need for scheduling.   Questions and concerns addressed. PMT will continue to support holistically.   Length of Stay: 12   Physical Exam Vitals and nursing note reviewed.  Constitutional:      General: She is sleeping. She is not in acute distress.    Appearance: She is ill-appearing.  HENT:     Head: Normocephalic and atraumatic.  Cardiovascular:     Rate and Rhythm: Bradycardia present.  Pulmonary:     Effort: Pulmonary effort is normal.  Skin:    General: Skin is warm and dry.  Neurological:     Mental Status: She is oriented to person, place, and time.  Psychiatric:        Mood and Affect: Mood normal.        Behavior: Behavior normal.             Vital Signs: BP (!) 145/54 (BP Location: Left Arm)   Pulse (!) 50   Temp 98.4 F (36.9 C) (Oral)   Resp 16   Ht 5' 1 (1.549 m)   Wt 132.5 kg   SpO2 100%   BMI 55.19 kg/m  SpO2: SpO2: 100 % O2 Device: O2 Device: Nasal  Cannula O2 Flow Rate: O2 Flow Rate (L/min): 3 L/min      Palliative Assessment/Data: 30%   Palliative Care Assessment & Plan   Patient Profile: 68 y.o. female  with past medical history of pulmonary hypertension, chronic respiratory failure requiring 2L [place] at baseline, Afib on Eliquis , HFpEF (EF 60-65% in 08/2023), DM2, DVT/PE x3 s/p IVC filter, CKD stage IV, and morbid obesity admitted on 12/15/2023 with shortness of breath and chest pain.    In the ED, patient was found to be in volume overload secondary to left  lower lobe pneumonia and HFpEF exacerbation with worsening renal failure.  Dialysis was started on 9/15.  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  Oncology has evaluated and recommended consideration of hospice given her poor functional status and not a good candidate for chemotherapy.   PMT has been consulted to assist with goals of care conversation.  Assessment: End of life care  Recommendations/Plan: Continue DNR/DNI Continue comfort focused care Scheduled Zofran  8mg  IV TID for nausea Continue to await [facility] bed when deemed eligible Psychosocial and emotional support provided PMT will continue to follow and support   Prognosis: < 2 weeks would not be surprising given discontinuation of dialysis and poor oral intake in the setting of metastatic cancer   Discharge Planning: Hospice facility    Navneet Schmuck SHAUNNA Fell, [place]  Palliative Medicine Team Team phone # [phone]  Thank you for allowing the Palliative Medicine Team to assist in the care of this patient. Please utilize secure chat with additional questions, if there is no response within 30 minutes please call the above phone number.  Palliative Medicine Team providers are available by phone from 7am to 7pm daily and can be reached through the team cell phone.  Should this patient require assistance outside of  these hours, please call the patient's attending physician.     Time Total: 35  Visit consisted of counseling and education dealing with the complex and emotionally intense issues of symptom management and palliative care in the setting of serious and potentially life-threatening illness. Greater than 50% of this time was spent counseling and coordinating care related to the above assessment and plan.  Personally spent 35 minutes in patient care including extensive chart review (labs, imaging, progress/consult notes, vital signs), medically appropraite exam, discussed with treatment team, education to patient, family, and staff, documenting clinical information, medication review and management, coordination of care, and available advanced directive documents.

## 2023-12-29 NOTE — Plan of Care (Signed)
  Problem: Education: Goal: Ability to describe self-care measures that may prevent or decrease complications (Diabetes Survival Skills Education) will improve Outcome: Progressing Goal: Individualized Educational Video(s) Outcome: Progressing   Problem: Coping: Goal: Ability to adjust to condition or change in health will improve Outcome: Progressing   Problem: Fluid Volume: Goal: Ability to maintain a balanced intake and output will improve Outcome: Progressing   Problem: Health Behavior/Discharge Planning: Goal: Ability to identify and utilize available resources and services will improve Outcome: Progressing Goal: Ability to manage health-related needs will improve Outcome: Progressing   Problem: Metabolic: Goal: Ability to maintain appropriate glucose levels will improve Outcome: Progressing   Problem: Nutritional: Goal: Maintenance of adequate nutrition will improve Outcome: Progressing Goal: Progress toward achieving an optimal weight will improve Outcome: Progressing   Problem: Skin Integrity: Goal: Risk for impaired skin integrity will decrease Outcome: Progressing   Problem: Tissue Perfusion: Goal: Adequacy of tissue perfusion will improve Outcome: Progressing   Problem: Education: Goal: Knowledge of General Education information will improve Description: Including pain rating scale, medication(s)/side effects and non-pharmacologic comfort measures Outcome: Progressing   Problem: Health Behavior/Discharge Planning: Goal: Ability to manage health-related needs will improve Outcome: Progressing   Problem: Clinical Measurements: Goal: Ability to maintain clinical measurements within normal limits will improve Outcome: Progressing Goal: Will remain free from infection Outcome: Progressing Goal: Diagnostic test results will improve Outcome: Progressing Goal: Respiratory complications will improve Outcome: Progressing Goal: Cardiovascular complication will  be avoided Outcome: Progressing   Problem: Activity: Goal: Risk for activity intolerance will decrease Outcome: Progressing   Problem: Nutrition: Goal: Adequate nutrition will be maintained Outcome: Progressing   Problem: Coping: Goal: Level of anxiety will decrease Outcome: Progressing   Problem: Elimination: Goal: Will not experience complications related to bowel motility Outcome: Progressing Goal: Will not experience complications related to urinary retention Outcome: Progressing   Problem: Pain Managment: Goal: General experience of comfort will improve and/or be controlled Outcome: Progressing   Problem: Safety: Goal: Ability to remain free from injury will improve Outcome: Progressing   Problem: Skin Integrity: Goal: Risk for impaired skin integrity will decrease Outcome: Progressing   Problem: Education: Goal: Knowledge of disease and its progression will improve Outcome: Progressing   Problem: Health Behavior/Discharge Planning: Goal: Ability to manage health-related needs will improve Outcome: Progressing   Problem: Clinical Measurements: Goal: Complications related to the disease process or treatment will be avoided or minimized Outcome: Progressing Goal: Dialysis access will remain free of complications Outcome: Progressing   Problem: Activity: Goal: Activity intolerance will improve Outcome: Progressing   Problem: Fluid Volume: Goal: Fluid volume balance will be maintained or improved Outcome: Progressing   Problem: Nutritional: Goal: Ability to make appropriate dietary choices will improve Outcome: Progressing   Problem: Respiratory: Goal: Respiratory symptoms related to disease process will be avoided Outcome: Progressing   Problem: Self-Concept: Goal: Body image disturbance will be avoided or minimized Outcome: Progressing   Problem: Urinary Elimination: Goal: Progression of disease will be identified and treated Outcome:  Progressing

## 2023-12-30 DIAGNOSIS — N179 Acute kidney failure, unspecified: Secondary | ICD-10-CM | POA: Diagnosis not present

## 2023-12-30 DIAGNOSIS — R52 Pain, unspecified: Secondary | ICD-10-CM

## 2023-12-30 DIAGNOSIS — Z515 Encounter for palliative care: Secondary | ICD-10-CM | POA: Diagnosis not present

## 2023-12-30 DIAGNOSIS — J189 Pneumonia, unspecified organism: Secondary | ICD-10-CM | POA: Diagnosis not present

## 2023-12-30 DIAGNOSIS — K769 Liver disease, unspecified: Secondary | ICD-10-CM

## 2023-12-30 DIAGNOSIS — C787 Secondary malignant neoplasm of liver and intrahepatic bile duct: Secondary | ICD-10-CM | POA: Diagnosis not present

## 2023-12-30 DIAGNOSIS — Z789 Other specified health status: Secondary | ICD-10-CM

## 2023-12-30 DIAGNOSIS — Z66 Do not resuscitate: Secondary | ICD-10-CM

## 2023-12-30 MED ORDER — HYDROMORPHONE HCL 1 MG/ML IJ SOLN
2.0000 mg | Freq: Four times a day (QID) | INTRAMUSCULAR | Status: DC
  Administered 2023-12-30 – 2023-12-31 (×4): 2 mg via INTRAVENOUS
  Filled 2023-12-30 (×4): qty 2

## 2023-12-30 MED ORDER — HYDROMORPHONE HCL 1 MG/ML IJ SOLN
2.0000 mg | INTRAMUSCULAR | Status: DC | PRN
  Administered 2023-12-30 – 2023-12-31 (×2): 2 mg via INTRAVENOUS
  Filled 2023-12-30 (×2): qty 2

## 2023-12-30 NOTE — TOC Progression Note (Signed)
 Transition of Care [facility]) - Progression Note    Patient Details  Name: [name] MRN: [id number] Date of Birth: [birth year]  Transition of Care [facility]) CM/SW Contact  Inocente GORMAN Kindle, LCSW Phone Number: 12/30/2023, 9:35 AM  Clinical Narrative:    CSW continuing to follow with [facility].    Expected Discharge Plan: Hospice Medical Facility Barriers to Discharge: No Barriers Identified               Expected Discharge Plan and Services In-house Referral: Hospice / Palliative Care   Post Acute Care Choice: Residential Hospice Bed Living arrangements for the past 2 months: Single Family Home                                       Social Drivers of Health (SDOH) Interventions SDOH Screenings   Food Insecurity: No Food Insecurity (12/16/2023)  Housing: Low Risk  (12/16/2023)  Recent Concern: Housing - High Risk (11/05/2023)  Transportation Needs: No Transportation Needs (12/16/2023)  Utilities: Not At Risk (12/16/2023)  Alcohol  Screen: Low Risk  (11/05/2023)  Social Connections: Moderately Isolated (12/16/2023)  Tobacco Use: Medium Risk (12/20/2023)    Readmission Risk Interventions     No data to display

## 2023-12-30 NOTE — Plan of Care (Signed)
  Problem: Education: Goal: Ability to describe self-care measures that may prevent or decrease complications (Diabetes Survival Skills Education) will improve Outcome: Progressing Goal: Individualized Educational Video(s) Outcome: Progressing   Problem: Coping: Goal: Ability to adjust to condition or change in health will improve Outcome: Progressing   Problem: Fluid Volume: Goal: Ability to maintain a balanced intake and output will improve Outcome: Progressing   Problem: Health Behavior/Discharge Planning: Goal: Ability to identify and utilize available resources and services will improve Outcome: Progressing Goal: Ability to manage health-related needs will improve Outcome: Progressing   Problem: Metabolic: Goal: Ability to maintain appropriate glucose levels will improve Outcome: Progressing   Problem: Nutritional: Goal: Maintenance of adequate nutrition will improve Outcome: Progressing Goal: Progress toward achieving an optimal weight will improve Outcome: Progressing   Problem: Skin Integrity: Goal: Risk for impaired skin integrity will decrease Outcome: Progressing   Problem: Tissue Perfusion: Goal: Adequacy of tissue perfusion will improve Outcome: Progressing   Problem: Education: Goal: Knowledge of General Education information will improve Description: Including pain rating scale, medication(s)/side effects and non-pharmacologic comfort measures Outcome: Progressing   Problem: Health Behavior/Discharge Planning: Goal: Ability to manage health-related needs will improve Outcome: Progressing   Problem: Clinical Measurements: Goal: Ability to maintain clinical measurements within normal limits will improve Outcome: Progressing Goal: Will remain free from infection Outcome: Progressing Goal: Diagnostic test results will improve Outcome: Progressing Goal: Respiratory complications will improve Outcome: Progressing Goal: Cardiovascular complication will  be avoided Outcome: Progressing   Problem: Activity: Goal: Risk for activity intolerance will decrease Outcome: Progressing   Problem: Nutrition: Goal: Adequate nutrition will be maintained Outcome: Progressing   Problem: Coping: Goal: Level of anxiety will decrease Outcome: Progressing   Problem: Elimination: Goal: Will not experience complications related to bowel motility Outcome: Progressing Goal: Will not experience complications related to urinary retention Outcome: Progressing   Problem: Pain Managment: Goal: General experience of comfort will improve and/or be controlled Outcome: Progressing   Problem: Safety: Goal: Ability to remain free from injury will improve Outcome: Progressing   Problem: Skin Integrity: Goal: Risk for impaired skin integrity will decrease Outcome: Progressing   Problem: Education: Goal: Knowledge of disease and its progression will improve Outcome: Progressing   Problem: Health Behavior/Discharge Planning: Goal: Ability to manage health-related needs will improve Outcome: Progressing   Problem: Clinical Measurements: Goal: Complications related to the disease process or treatment will be avoided or minimized Outcome: Progressing Goal: Dialysis access will remain free of complications Outcome: Progressing   Problem: Activity: Goal: Activity intolerance will improve Outcome: Progressing   Problem: Fluid Volume: Goal: Fluid volume balance will be maintained or improved Outcome: Progressing   Problem: Nutritional: Goal: Ability to make appropriate dietary choices will improve Outcome: Progressing   Problem: Respiratory: Goal: Respiratory symptoms related to disease process will be avoided Outcome: Progressing   Problem: Self-Concept: Goal: Body image disturbance will be avoided or minimized Outcome: Progressing   Problem: Urinary Elimination: Goal: Progression of disease will be identified and treated Outcome:  Progressing

## 2023-12-30 NOTE — Progress Notes (Signed)
 Daily Progress Note   Patient Name: [name]       Date: 12/30/2023 DOB: 04-Mar-1956  Age: 68 y.o. MRN#: [id number] Attending Physician: Raenelle Donalda HERO, MD Primary Care Physician: Valma Carwin, MD Admit Date: 12/15/2023  Reason for Consultation/Follow-up: Establishing goals of care  Subjective: I have reviewed medical records including EPIC notes and MAR. As needed medications received in the last 24 hours: dilaudid  x3, zofran  x1, phenergan  x1. She remains on scheduled tylenol  and zofran .   Went to visit patient at bedside - no family/visitors present. Patient was lying in bed awake and alert. No signs or non-verbal gestures of pain or discomfort noted. No respiratory distress, increased work of breathing, or secretions noted. She endorses pain and shortness of breath - will schedule opioid.  Plan is for [facility] transfer once approved.  Received report from primary RN - no acute concerns. Discussed symptom management plan with RN. She has administered PRN zofran , robinul , and dilaudid . Will schedule dilaudid  and increase the PRN dose range.  Notified ACC liaison of increased symptom management needs. They are providing ongoing evaluation for IPU appropriateness.   Length of Stay: 13  Current Medications: Scheduled Meds:   acetaminophen   1,000 mg Oral TID   ARIPiprazole   2 mg Oral QHS   citalopram   20 mg Oral QHS   melatonin  10 mg Oral QHS   polyethylene glycol  17 g Oral BID    Continuous Infusions:  ondansetron  (ZOFRAN ) IV 8 mg (12/29/23 2307)   promethazine  (PHENERGAN ) injection (IM or IVPB) 12.5 mg (12/29/23 0823)    PRN Meds: antiseptic oral rinse, artificial tears, bisacodyl , diphenhydrAMINE , glycopyrrolate  **OR** glycopyrrolate  **OR** glycopyrrolate ,  haloperidol  **OR** haloperidol  **OR** haloperidol  lactate, HYDROmorphone  (DILAUDID ) injection, LORazepam  **OR** LORazepam  **OR** LORazepam , ondansetron  (ZOFRAN ) IV, mouth rinse, promethazine  (PHENERGAN ) injection (IM or IVPB), senna-docusate  Physical Exam Vitals and nursing note reviewed.  Constitutional:      General: She is not in acute distress.    Appearance: She is ill-appearing.  Pulmonary:     Effort: No respiratory distress.  Skin:    General: Skin is warm and dry.  Neurological:     Mental Status: She is alert.     Motor: Weakness present.  Psychiatric:        Attention and Perception: Attention normal.  Behavior: Behavior is cooperative.             Vital Signs: BP (!) 124/48 (BP Location: Left Arm)   Pulse (!) 50   Temp 98.1 F (36.7 C) (Oral)   Resp 16   Ht 5' 1 (1.549 m)   Wt 132.5 kg   SpO2 (!) 10%   BMI 55.19 kg/m  SpO2: SpO2: (!) 10 % O2 Device: O2 Device: Nasal Cannula O2 Flow Rate: O2 Flow Rate (L/min): 3 L/min  Intake/output summary:  Intake/Output Summary (Last 24 hours) at 12/30/2023 9166 Last data filed at 12/30/2023 0100 Gross per 24 hour  Intake --  Output 300 ml  Net -300 ml   LBM: Last BM Date : 12/28/23 Baseline Weight: Weight: (!) 138.8 kg Most recent weight: Weight: 132.5 kg       Palliative Assessment/Data: PPS 20%      Patient Active Problem List   Diagnosis Date Noted   Metastatic carcinoma to liver (HCC) 12/24/2023   Goals of care, counseling/discussion 12/24/2023   CAP (community acquired pneumonia) 12/16/2023   Renal cyst 12/14/2023   Abscess of right axilla 11/30/2023   Intractable nausea and vomiting 11/11/2023   Drug overdose, intentional, initial encounter (HCC) 11/11/2023   Polyuria 11/11/2023   CKD (chronic kidney disease) stage 4, GFR 15-29 ml/min (HCC) 11/11/2023   Infection 11/11/2023   Chronic venous insufficiency 11/10/2023   Lower extremity ulceration, unspecified laterality, limited to breakdown of  skin (HCC) 11/10/2023   MDD (major depressive disorder), recurrent severe, without psychosis (HCC) 11/05/2023   MDD (major depressive disorder), severe (HCC) 11/03/2023   Acute respiratory failure with hypoxia (HCC) 09/11/2023   Obesity hypoventilation syndrome (HCC) 09/07/2023   WHO group 3 pulmonary arterial hypertension (HCC) 09/07/2023   (HFpEF) heart failure with preserved ejection fraction (HCC) 09/03/2023   Elevated troponin 09/03/2023   Subtherapeutic international normalized ratio (INR) 09/03/2023   Presence of permanent cardiac pacemaker 09/03/2023   History of DVT (deep vein thrombosis) 09/03/2023   Pain in left hip 01/07/2023   Pain in right knee 01/07/2023   Type 2 diabetes mellitus with stage 3b chronic kidney disease, with long-term current use of insulin  (HCC) 09/09/2021   Mixed diabetic hyperlipidemia associated with type 2 diabetes mellitus (HCC) 09/09/2021   Hypothyroidism 09/09/2021   Wound of left leg, initial encounter 09/09/2021   C6 cervical fracture (HCC) 09/01/2018   Atrial flutter (HCC) 09/01/2018   Paroxysmal atrial fibrillation (HCC) 09/01/2018   Syncope 08/29/2018   Symptomatic advanced heart block 08/29/2018   Acute on chronic diastolic CHF (congestive heart failure) (HCC)    COPD (chronic obstructive pulmonary disease) (HCC) 09/12/2015   Personal history of endocrine, metabolic or immunity disorder 87/72/7983   Decreased mobility 04/03/2015   Deep vein thrombosis (DVT) of both lower extremities (HCC) 02/24/2015   Chronic kidney disease, stage 3b (HCC) 02/24/2015   Long term current use of anticoagulant therapy 11/19/2014   Osteoarthritis 08/31/2014   Cellulitis of hand, right 08/31/2014   Tremors  05/07/2012   Obstructive sleep apnea 05/03/2012   Thrombocytopenia 08/18/2011   Diastolic dysfunction, grade 2, EF 65-70% May 2013 08/14/2011   LBBB (left bundle branch block) 08/14/2011   Angina pectoris (HCC) 08/13/2011   Morbid obesity with BMI of  50.0-59.9, adult (HCC) 08/13/2011   Essential hypertension 08/13/2011   Anemia 08/13/2011   History of pulmonary embolism, recurrent, 3 seperate episodes. 08/13/2011   Presence of IVC filter, placed June 2011 after 3d PE 08/13/2011   Pulmonary  HTN, Moderate  PA pressure 09/2009 08/13/2011    Palliative Care Assessment & Plan   Patient Profile: 68 y.o. female  with past medical history of pulmonary hypertension, chronic respiratory failure requiring 2L [place] at baseline, Afib on Eliquis , HFpEF (EF 60-65% in 08/2023), DM2, DVT/PE x3 s/p IVC filter, CKD stage IV, and morbid obesity admitted on 12/15/2023 with shortness of breath and chest pain.    In the ED, patient was found to be in volume overload secondary to left lower lobe pneumonia and HFpEF exacerbation with worsening renal failure.  Dialysis was started on 9/15.  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  Oncology has evaluated and recommended consideration of hospice given her poor functional status and not a good candidate for chemotherapy.  Assessment: Principal Problem:   CAP (community acquired pneumonia) Active Problems:   Metastatic carcinoma to liver (HCC)   Goals of care, counseling/discussion   Terminal care  Recommendations/Plan: Continue full comfort measures Continue DNR-comfort as previously documented - durable DNR form completed and placed in shadow chart. Copy was made and will be scanned into Vynca/ACP tab Transfer to [facility] pending confirmation of eligibility and bed availability. ACC liaison following and re-assessing for eligibility as appropriate Changes to comfort focused medication regimen as noted below Added orders for EOL symptom management and to reflect full comfort measures, as well as discontinued orders that were not focused on comfort Continue palliative wound care PMT will continue to follow and support  holistically  Symptom Management Added scheduled dilaudid  q6h; increased PRN dose range to 2-4mg  as needed for pain/dyspnea/increased work of breathing/RR>25 Scheduled tylenol  TID Biotin  PRN dry mouth Benadryl  PRN itching Robinul  PRN secretions Haldol  PRN agitation/delirium Ativan  PRN anxiety/seizure/sleep/distress Scheduled zofran  TID; continue PRN doses for breakthrough nausea/vomiting Phenergan  PRN nausea/vomiting Liquifilm Tears PRN dry eye Continue Abilify , celexa , melatonin, miralax , and senna-docusate while tolerating POs   Goals of Care and Additional Recommendations: Limitations on Scope of Treatment: Full Comfort Care  Code Status:    Code Status Orders  (From admission, onward)           Start     Ordered   12/25/23 1321  Do not attempt resuscitation (DNR) - Comfort care  Continuous       Question Answer Comment  If patient has no pulse and is not breathing Do Not Attempt Resuscitation   In Pre-Arrest Conditions (Patient Is Breathing and Has a Pulse) Provide comfort measures. Relieve any mechanical airway obstruction. Avoid transfer unless required for comfort.   Consent: Discussion documented in EHR or advanced directives reviewed      12/25/23 1334           Code Status History     Date Active Date Inactive Code Status Order ID Comments User Context   12/25/2023 1158 12/25/2023 1334 Do not attempt resuscitation (DNR) - Comfort care [id number]  Sherlon Brayton RAMAN, MD Inpatient   12/16/2023 [phone] 12/25/2023 1158 Limited: Do not attempt resuscitation (DNR) -DNR-LIMITED -Do Not Intubate/DNI  [id number]  Georgina Basket, MD ED   11/12/2023 0028 12/06/2023 2141 Limited: Do not attempt resuscitation (DNR) -DNR-LIMITED -Do Not Intubate/DNI  [id number]  Donnelly Mellow, MD Inpatient   11/11/2023 1407 11/12/2023 0028 Limited: Do not attempt resuscitation (DNR) -DNR-LIMITED -Do Not Intubate/DNI  [id number]  Cox, Amy N, DO Inpatient   11/06/2023 1350 11/11/2023 1355 Limited: Do not  attempt resuscitation (DNR) -DNR-LIMITED -Do Not Intubate/DNI  [id number]  Donnelly,  Allyn, MD Inpatient   11/05/2023 1625 11/06/2023 1349 Full Code [id number]  Mannie Jerel PARAS, NP Inpatient   09/11/2023 1433 09/16/2023 1709 Limited: Do not attempt resuscitation (DNR) -DNR-LIMITED -Do Not Intubate/DNI  [id number]  Seena Marsa NOVAK, MD ED   09/08/2023 1224 09/08/2023 1903 Limited: Do not attempt resuscitation (DNR) -DNR-LIMITED -Do Not Intubate/DNI  [id number]  Cleotilde Laymon HERO, NP Inpatient   09/03/2023 2231 09/08/2023 1224 Full Code [id number]  Claudene Maximino LABOR, MD Inpatient   09/09/2021 0208 09/12/2021 2039 Full Code [id number]  Kenard Zachary PARAS, MD ED   12/26/2018 1522 12/31/2018 1659 Full Code [id number]  Claudene Maximino LABOR, MD ED   08/29/2018 1611 09/03/2018 1646 Full Code [id number]  Barbarann Nest, MD ED   05/04/2016 2254 05/08/2016 2145 Full Code [id number]  Georgian Sallyann PARAS, DO Inpatient   09/12/2015 0825 09/18/2015 1853 Full Code [id number]  Dina Camie BRAVO, PA-C ED   03/13/2015 1404 03/13/2015 2007 Full Code [id number]  Ladona Heinz, MD Inpatient   08/31/2014 1557 09/05/2014 1841 Full Code [id number]  Elgergawy, Brayton RAMAN, MD Inpatient   01/18/2013 2100 01/21/2013 1437 Full Code [id number]  Lonzell Emeline HERO, DO ED   05/03/2012 1344 05/11/2012 1903 Full Code [id number]  Carron Sharlet Pulling, RN Inpatient   08/13/2011 2051 09/04/2011 1913 Full Code [id number]  Delmon Dorothyann DEL, RN Inpatient       Prognosis:  < 2 weeks  Discharge Planning: Hospice facility  Care plan was discussed with primary RN, patient  Thank you for allowing the Palliative Medicine Team to assist in the care of this patient.     Jeoffrey HERO Claudene, NP  Please contact Palliative Medicine Team phone at [phone] for questions and concerns.   *Portions of this note are a verbal dictation therefore any spelling and/or grammatical errors are due to the Dragon Medical One system interpretation.

## 2023-12-30 NOTE — Progress Notes (Signed)
 PROGRESS NOTE        PATIENT DETAILS Name: [name] Age: 68 y.o. Sex: female Date of Birth: 11-08-1955 Admit Date: 12/15/2023 Admitting Physician Redia LOISE Cleaver, MD ERE:[id number], [place], MD  Brief Summary: Patient is a 68 y.o.  female with history of chronic hypoxic respiratory failure on 2 L of oxygen at baseline, A-fib, chronic HFpEF, pulmonary hypertension, DVT/PE x 3-s/p IVC filter 2011, DM-2, CKD 4-who presented with volume overload in the setting of HFpEF exacerbation and worsening AKI.  She was also found to have liver masses on imaging studies.  She was initially managed with diuretics-however became uremic-nephrology started hemodialysis.  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  She was evaluated by oncology-felt not to be a candidate for systemic therapy-and subsequently has been transitioned to full comfort measures.  Significant studies: 9/9>> CT renal stone study: Multiple hypoattenuating liver lesions concerning for mets.  Significant microbiology data: 9/10>> blood culture: No growth 9/10>> urine culture: Citrobacter.  Procedures: 9/15>> HD catheter placed by IR 9/16>> liver biopsy by IR  Consults: Oncology Nephrology Interventional radiology Palliative care  Subjective: Feels nauseous-very poor appetite-only ate a little bit of mac & cheese yesterday.  She could not eat anything else.  Objective: Vitals: Blood pressure (!) 124/48, pulse (!) 50, temperature 98.1 F (36.7 C), temperature source Oral, resp. rate 16, height 5' 1 (1.549 m), weight 132.5 kg, SpO2 (!) 10%.   Exam: Gen Exam:Alert awake-not in any distress-chronically frail/deconditioned. HEENT:atraumatic, normocephalic Chest: B/L clear to auscultation anteriorly CVS:S1S2 regular Abdomen: Soft-slightly tender in the right upper quadrant. Extremities: Both lower  extremities wrapped. Neurology: Non focal Skin: no rash  Pertinent Labs/Radiology:    Latest Ref Rng & Units 12/25/2023    2:19 AM 12/24/2023    6:37 AM 12/23/2023    2:32 AM  CBC  WBC 4.0 - 10.5 K/uL 8.0  7.1  7.6   Hemoglobin 12.0 - 15.0 g/dL 9.2  8.7  9.1   Hematocrit 36.0 - 46.0 % 30.7  29.6  30.2   Platelets 150 - 400 K/uL 133  131  142     Lab Results  Component Value Date   NA 138 12/25/2023   K 3.9 12/25/2023   CL 98 12/25/2023   CO2 24 12/25/2023      Assessment/Plan: Newly diagnosed metastatic cholangiocarcinoma CT imaging on admission-positive for liver masses-s/p biopsy on 9/16-pathology consistent with metastatic cholangiocarcinoma Evaluated by oncology-not a candidate for systemic therapy After extensive discussion by prior physicians/palliative care team-now comfort measures.  Nausea/vomiting/anorexia/failure to thrive syndrome Secondary to above Very poor appetite-had only a little bit of mac & cheese yesterday Continues to have significant nausea-which could be secondary to uremia or from underlying malignancy. Antiemetics as needed.  Acute on chronic HFpEF Initially started on IV diuretics-but became uremic and required dialysis No further plans for dialysis-comfort measures, volume status is reasonable this morning  AKI on CKD 4 Started on hemodialysis as patient became uremic during the early part of this hospitalization Since then-diagnosed with stage IV/metastatic cholangiocarcinoma-comfort measures now in place-no plans for dialysis or checking renal function.  Chronic hypoxic respiratory failure-on home O2 2 L Secondary to pulmonary hypertension and HFpEF Comfort measures  Left lower lobe PNA Completed a course of antibiotics Comfort measures in place  Citrobacter UTI Completed a course  of IV cefepime   History of pulmonary hypertension Supportive care  PAF Eliquis  briefly held for liver biopsy Comfort measures in place-no longer on  Eliquis .  HTN No longer on antihypertensives-comfort care  Hypothyroidism No longer on Synthroid -comfort care  DM-2 No longer on insulin -no longer checking CBGs-comfort care  Chronic bilateral lower extremity lymphedema History of venous stasis ulcer LLE Unna boots Comfort measures.  Class III obesity: Estimated body mass index is 55.19 kg/m as calculated from the following:   Height as of this encounter: 5' 1 (1.549 m).   Weight as of this encounter: 132.5 kg.   Code status:   Code Status: Do not attempt resuscitation (DNR) - Comfort care   DVT Prophylaxis:not needed-comfort care  Family Communication: None at bedside   Disposition Plan: Status is: Inpatient Remains inpatient appropriate because: Severity of illness   Planned Discharge Destination:Hospice care   Diet: Diet Order             Diet regular Room service appropriate? Yes; Fluid consistency: Thin  Diet effective now                     Antimicrobial agents: Anti-infectives (From admission, onward)    Start     Dose/Rate Route Frequency Ordered Stop   12/21/23 1334  ceFAZolin  (ANCEF ) IVPB 2g/100 mL premix        over 30 Minutes Intravenous Continuous PRN 12/21/23 1335 12/21/23 1349   12/21/23 0930  ceFAZolin  (ANCEF ) IVPB 2g/100 mL premix        2 g 200 mL/hr over 30 Minutes Intravenous  Once 12/21/23 0843 12/21/23 2114   12/19/23 1430  ceFEPIme  (MAXIPIME ) 2 g in sodium chloride  0.9 % 100 mL IVPB        2 g 200 mL/hr over 30 Minutes Intravenous Every 24 hours 12/19/23 1331 12/22/23 0656   12/19/23 1430  azithromycin  (ZITHROMAX ) tablet 500 mg        500 mg Oral Daily 12/19/23 1337 12/20/23 1015   12/17/23 1000  cefTRIAXone  (ROCEPHIN ) 1 g in sodium chloride  0.9 % 100 mL IVPB  Status:  Discontinued        1 g 200 mL/hr over 30 Minutes Intravenous Every 24 hours 12/16/23 1417 12/19/23 1331   12/17/23 1000  azithromycin  (ZITHROMAX ) tablet 500 mg        500 mg Oral Daily 12/16/23 1417 12/18/23  0908   12/15/23 2345  azithromycin  (ZITHROMAX ) 500 mg in sodium chloride  0.9 % 250 mL IVPB        500 mg 250 mL/hr over 60 Minutes Intravenous  Once 12/15/23 2331 12/16/23 0159   12/15/23 2345  cefTRIAXone  (ROCEPHIN ) 1 g in sodium chloride  0.9 % 100 mL IVPB        1 g 200 mL/hr over 30 Minutes Intravenous  Once 12/15/23 2331 12/16/23 0041        MEDICATIONS: Scheduled Meds:  acetaminophen   1,000 mg Oral TID   ARIPiprazole   2 mg Oral QHS   Chlorhexidine  Gluconate Cloth  6 each Topical Q0600   citalopram   20 mg Oral QHS   melatonin  10 mg Oral QHS   polyethylene glycol  17 g Oral BID   Continuous Infusions:  ondansetron  (ZOFRAN ) IV 8 mg (12/29/23 2307)   promethazine  (PHENERGAN ) injection (IM or IVPB) 12.5 mg (12/29/23 0823)   PRN Meds:.antiseptic oral rinse, artificial tears, bisacodyl , diphenhydrAMINE , glycopyrrolate  **OR** glycopyrrolate  **OR** glycopyrrolate , haloperidol  **OR** haloperidol  **OR** haloperidol  lactate, HYDROmorphone  (DILAUDID ) injection, LORazepam  **OR** LORazepam  **OR**  LORazepam , ondansetron  (ZOFRAN ) IV, mouth rinse, promethazine  (PHENERGAN ) injection (IM or IVPB), senna-docusate   I have personally reviewed following labs and imaging studies  LABORATORY DATA: CBC: Recent Labs  Lab 12/24/23 0637 12/25/23 0219  WBC 7.1 8.0  HGB 8.7* 9.2*  HCT 29.6* 30.7*  MCV 103.1* 103.4*  PLT 131* 133*    Basic Metabolic Panel: Recent Labs  Lab 12/24/23 0637 12/25/23 0219  NA 136 138  K 3.8 3.9  CL 96* 98  CO2 28 24  GLUCOSE 85 110*  BUN 90* 60*  CREATININE 2.99* 2.25*  CALCIUM  8.6* 8.6*  PHOS 5.6* 4.8*    GFR: Estimated Creatinine Clearance: 31.3 mL/min (A) (by C-G formula based on SCr of 2.25 mg/dL (H)).  Liver Function Tests: Recent Labs  Lab 12/24/23 0637 12/25/23 0219  ALBUMIN  2.7* 2.8*   No results for input(s): LIPASE, AMYLASE in the last 168 hours. No results for input(s): AMMONIA in the last 168 hours.  Coagulation Profile: No  results for input(s): INR, PROTIME in the last 168 hours.  Cardiac Enzymes: No results for input(s): CKTOTAL, CKMB, CKMBINDEX, TROPONINI in the last 168 hours.  BNP (last 3 results) No results for input(s): PROBNP in the last 8760 hours.  Lipid Profile: No results for input(s): CHOL, HDL, LDLCALC, TRIG, CHOLHDL, LDLDIRECT in the last 72 hours.  Thyroid  Function Tests: No results for input(s): TSH, T4TOTAL, FREET4, T3FREE, THYROIDAB in the last 72 hours.  Anemia Panel: No results for input(s): VITAMINB12, FOLATE, FERRITIN, TIBC, IRON , RETICCTPCT in the last 72 hours.  Urine analysis:    Component Value Date/Time   COLORURINE AMBER (A) 12/16/2023 0013   APPEARANCEUR CLOUDY (A) 12/16/2023 0013   LABSPEC 1.013 12/16/2023 0013   PHURINE 5.0 12/16/2023 0013   GLUCOSEU NEGATIVE 12/16/2023 0013   HGBUR MODERATE (A) 12/16/2023 0013   BILIRUBINUR NEGATIVE 12/16/2023 0013   KETONESUR NEGATIVE 12/16/2023 0013   PROTEINUR 30 (A) 12/16/2023 0013   UROBILINOGEN 0.2 08/31/2014 1158   NITRITE NEGATIVE 12/16/2023 0013   LEUKOCYTESUR LARGE (A) 12/16/2023 0013    Sepsis Labs: Lactic Acid, Venous    Component Value Date/Time   LATICACIDVEN 1.0 12/16/2023 0013    MICROBIOLOGY: No results found for this or any previous visit (from the past 240 hours).  RADIOLOGY STUDIES/RESULTS: No results found.   LOS: 13 days   Donalda Applebaum, MD  [facility]    To contact the attending provider between 7A-7P or the covering provider during after hours 7P-7A, please log into the web site [email or web address] and access using universal [place] password for that web site. If you do not have the password, please call the hospital operator.  12/30/2023, 8:11 AM

## 2023-12-30 NOTE — TOC Progression Note (Addendum)
 Transition of Care [facility]) - Progression Note    Patient Details  Name: [name] MRN: [id number] Date of Birth: June 13, 1955  [facility] CM/SW Contact  Marval Gell, RN Phone Number: 12/30/2023, 1:46 PM  Clinical Narrative:     11:35 Spoke w patient to discuss different inpatient hospice options. She is agreeable to looking at different facilities that may be able to accept her faster.  Referral made to [facility].  13:46 HoP is able to approve the patient for inpatient hospice services. They will have a bed meeting tomorrow at 9am and let CSW know if they have a bed opening.  Attending, palliative, ACC, and CSW updated.   Expected Discharge Plan: Hospice Medical Facility Barriers to Discharge: No Barriers Identified               Expected Discharge Plan and Services In-house Referral: Hospice / Palliative Care   Post Acute Care Choice: Residential Hospice Bed Living arrangements for the past 2 months: Single Family Home                                       Social Drivers of Health (SDOH) Interventions SDOH Screenings   Food Insecurity: No Food Insecurity (12/16/2023)  Housing: Low Risk  (12/16/2023)  Recent Concern: Housing - High Risk (11/05/2023)  Transportation Needs: No Transportation Needs (12/16/2023)  Utilities: Not At Risk (12/16/2023)  Alcohol  Screen: Low Risk  (11/05/2023)  Social Connections: Moderately Isolated (12/16/2023)  Tobacco Use: Medium Risk (12/20/2023)    Readmission Risk Interventions     No data to display

## 2023-12-31 ENCOUNTER — Inpatient Hospital Stay (HOSPITAL_COMMUNITY)

## 2023-12-31 DIAGNOSIS — Z515 Encounter for palliative care: Secondary | ICD-10-CM | POA: Diagnosis not present

## 2023-12-31 DIAGNOSIS — N179 Acute kidney failure, unspecified: Secondary | ICD-10-CM | POA: Diagnosis not present

## 2023-12-31 DIAGNOSIS — R7989 Other specified abnormal findings of blood chemistry: Secondary | ICD-10-CM

## 2023-12-31 DIAGNOSIS — K769 Liver disease, unspecified: Secondary | ICD-10-CM | POA: Diagnosis not present

## 2023-12-31 DIAGNOSIS — Z789 Other specified health status: Secondary | ICD-10-CM | POA: Diagnosis not present

## 2023-12-31 DIAGNOSIS — Z66 Do not resuscitate: Secondary | ICD-10-CM | POA: Diagnosis not present

## 2023-12-31 DIAGNOSIS — R4589 Other symptoms and signs involving emotional state: Secondary | ICD-10-CM

## 2023-12-31 DIAGNOSIS — J189 Pneumonia, unspecified organism: Secondary | ICD-10-CM | POA: Diagnosis not present

## 2023-12-31 HISTORY — PX: IR REMOVAL TUN CV CATH W/O FL: IMG2289

## 2023-12-31 MED ORDER — ACETAMINOPHEN 500 MG PO TABS: 1000.0000 mg | ORAL_TABLET | Freq: Three times a day (TID) | ORAL | Status: AC

## 2023-12-31 MED ORDER — HYDROMORPHONE HCL 1 MG/ML IJ SOLN: 2.0000 mg | Freq: Four times a day (QID) | INTRAMUSCULAR | Status: AC

## 2023-12-31 MED ORDER — GLYCOPYRROLATE 1 MG PO TABS: 1.0000 mg | ORAL_TABLET | ORAL | Status: AC | PRN

## 2023-12-31 MED ORDER — SODIUM CHLORIDE 0.9 % IV SOLN: 8.0000 mg | Freq: Three times a day (TID) | INTRAVENOUS | Status: AC

## 2023-12-31 MED ORDER — HALOPERIDOL LACTATE 2 MG/ML PO CONC: 0.5000 mg | Freq: Four times a day (QID) | ORAL | Status: AC | PRN

## 2023-12-31 MED ORDER — LORAZEPAM 2 MG/ML PO CONC: 1.0000 mg | ORAL | Status: AC | PRN

## 2023-12-31 MED ORDER — POLYETHYLENE GLYCOL 3350 17 G PO PACK: 17.0000 g | PACK | Freq: Two times a day (BID) | ORAL | Status: AC

## 2023-12-31 NOTE — Procedures (Signed)
 Successful removal of right IJ tunneled HD catheter.   After obtaining consent and performing a time-out, the right upper chest was prepped and draped in the normal sterile fashion. The heparin  was removed from both ports. Using gentle manual traction the cuff of the catheter was exposed and the catheter was removed in its entirety. Pressure was held until hemostasis was obtained. A sterile dressing was applied. The patient tolerated the procedure well with no immediate complications.    Cherelle Midkiff, AGACNP-BC 12/31/2023, 9:38 AM

## 2023-12-31 NOTE — Progress Notes (Signed)
 Report given to Charlene at [facility] at [facility]

## 2023-12-31 NOTE — Plan of Care (Signed)
  Problem: Education: Goal: Ability to describe self-care measures that may prevent or decrease complications (Diabetes Survival Skills Education) will improve Outcome: Progressing Goal: Individualized Educational Video(s) Outcome: Progressing   Problem: Coping: Goal: Ability to adjust to condition or change in health will improve Outcome: Progressing   Problem: Fluid Volume: Goal: Ability to maintain a balanced intake and output will improve Outcome: Progressing   Problem: Health Behavior/Discharge Planning: Goal: Ability to identify and utilize available resources and services will improve Outcome: Progressing Goal: Ability to manage health-related needs will improve Outcome: Progressing   Problem: Metabolic: Goal: Ability to maintain appropriate glucose levels will improve Outcome: Progressing   Problem: Nutritional: Goal: Maintenance of adequate nutrition will improve Outcome: Progressing Goal: Progress toward achieving an optimal weight will improve Outcome: Progressing   Problem: Skin Integrity: Goal: Risk for impaired skin integrity will decrease Outcome: Progressing   Problem: Tissue Perfusion: Goal: Adequacy of tissue perfusion will improve Outcome: Progressing   Problem: Education: Goal: Knowledge of General Education information will improve Description: Including pain rating scale, medication(s)/side effects and non-pharmacologic comfort measures Outcome: Progressing   Problem: Health Behavior/Discharge Planning: Goal: Ability to manage health-related needs will improve Outcome: Progressing   Problem: Clinical Measurements: Goal: Ability to maintain clinical measurements within normal limits will improve Outcome: Progressing Goal: Will remain free from infection Outcome: Progressing Goal: Diagnostic test results will improve Outcome: Progressing Goal: Respiratory complications will improve Outcome: Progressing Goal: Cardiovascular complication will  be avoided Outcome: Progressing   Problem: Activity: Goal: Risk for activity intolerance will decrease Outcome: Progressing   Problem: Nutrition: Goal: Adequate nutrition will be maintained Outcome: Progressing   Problem: Coping: Goal: Level of anxiety will decrease Outcome: Progressing   Problem: Elimination: Goal: Will not experience complications related to bowel motility Outcome: Progressing Goal: Will not experience complications related to urinary retention Outcome: Progressing   Problem: Pain Managment: Goal: General experience of comfort will improve and/or be controlled Outcome: Progressing   Problem: Safety: Goal: Ability to remain free from injury will improve Outcome: Progressing   Problem: Skin Integrity: Goal: Risk for impaired skin integrity will decrease Outcome: Progressing   Problem: Education: Goal: Knowledge of disease and its progression will improve Outcome: Progressing   Problem: Health Behavior/Discharge Planning: Goal: Ability to manage health-related needs will improve Outcome: Progressing   Problem: Clinical Measurements: Goal: Complications related to the disease process or treatment will be avoided or minimized Outcome: Progressing Goal: Dialysis access will remain free of complications Outcome: Progressing   Problem: Activity: Goal: Activity intolerance will improve Outcome: Progressing   Problem: Fluid Volume: Goal: Fluid volume balance will be maintained or improved Outcome: Progressing   Problem: Nutritional: Goal: Ability to make appropriate dietary choices will improve Outcome: Progressing   Problem: Respiratory: Goal: Respiratory symptoms related to disease process will be avoided Outcome: Progressing   Problem: Self-Concept: Goal: Body image disturbance will be avoided or minimized Outcome: Progressing   Problem: Urinary Elimination: Goal: Progression of disease will be identified and treated Outcome:  Progressing

## 2023-12-31 NOTE — TOC Transition Note (Signed)
 Transition of Care [facility]) - Discharge Note   Patient Details  Name: [name] MRN: [id number] Date of Birth: Jul 20, 1955  Transition of Care [facility]) CM/SW Contact:  Inocente GORMAN Kindle, LCSW Phone Number: 12/31/2023, 3:28 PM   Clinical Narrative:    [facility] to: [facility]  Anticipated DC date: 12/31/23 Family notified: Pt notified family Transport by: [facility]   Per MD patient ready for DC to [facility]. RN to call report prior to discharge [phone]). RN, patient, patient's family, and facility notified of DC. Discharge Summary and sent to facility. DC packet on chart including signed DNR. Ambulance transport requested for patient.   CSW will sign off for now as social work intervention is no longer needed. Please consult us  again if new needs arise.     Final next level of care: Hospice Medical Facility Barriers to Discharge: Barriers Resolved   Patient Goals and CMS Choice Patient states their goals for this hospitalization and ongoing recovery are:: Comfort CMS Medicare.gov Compare Post Acute Care list provided to:: Patient Choice offered to / list presented to : Patient [place] ownership interest in [facility].provided to:: Patient    Discharge Placement              Patient [facility] bed at:  [facility]) Patient to be transferred to facility by: [facility] Name of family member notified: pt notified Patient and family notified of of transfer: 12/31/23  Discharge Plan and Services Additional resources added to the After Visit Summary for   In-house Referral: Hospice / Palliative Care   Post Acute Care Choice: Residential Hospice Bed                               Social Drivers of Health (SDOH) Interventions SDOH Screenings   Food Insecurity: No Food Insecurity (12/16/2023)  Housing: Low Risk  (12/16/2023)  Recent Concern: Housing - High Risk (11/05/2023)   Transportation Needs: No Transportation Needs (12/16/2023)  Utilities: Not At Risk (12/16/2023)  Alcohol  Screen: Low Risk  (11/05/2023)  Social Connections: Moderately Isolated (12/16/2023)  Tobacco Use: Medium Risk (12/20/2023)     Readmission Risk Interventions    12/31/2023    3:27 PM  Readmission Risk Prevention Plan  Transportation Screening Complete  Medication Review (RN Care Manager) Complete  PCP or Specialist appointment within 3-5 days of discharge Complete  HRI or Home Care Consult Complete  SW Recovery Care/Counseling Consult Complete  Palliative Care Screening Complete  Skilled Nursing Facility Complete

## 2023-12-31 NOTE — Discharge Summary (Signed)
 PATIENT DETAILS Name: [name] Age: 68 y.o. Sex: female Date of Birth: Feb 02, 1956 MRN: [id number]. Admitting Physician: Redia LOISE Cleaver, MD ERE:[id number], Ga[place], MD  Admit Date: 12/15/2023 Discharge date: 12/31/2023  Recommendations for Outpatient Follow-up:  Optimize comfort measures  Admitted From:  SNF  Disposition: Hospice care   Discharge Condition: poor  CODE STATUS:   Code Status: Do not attempt resuscitation (DNR) - Comfort care   Diet recommendation:  Diet Order             Diet general           Diet regular Room service appropriate? Yes; Fluid consistency: Thin  Diet effective now                    Brief Summary: Patient is a 68 y.o.  female with history of chronic hypoxic respiratory failure on 2 L of oxygen at baseline, A-fib, chronic HFpEF, pulmonary hypertension, DVT/PE x 3-s/p IVC filter 2011, DM-2, CKD 4-who presented with volume overload in the setting of HFpEF exacerbation and worsening AKI.  She was also found to have liver masses on imaging studies.  She was initially managed with diuretics-however became uremic-nephrology started hemodialysis.  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  She was evaluated by oncology-felt not to be a candidate for systemic therapy-and subsequently has been transitioned to full comfort measures.   Significant studies: 9/9>> CT renal stone study: Multiple hypoattenuating liver lesions concerning for mets.   Significant microbiology data: 9/10>> blood culture: No growth 9/10>> urine culture: Citrobacter.   Procedures: 9/15>> HD catheter placed by IR 9/16>> liver biopsy by IR   Consults: Oncology Nephrology Interventional radiology Palliative care  Brief Hospital Course: Newly diagnosed metastatic cholangiocarcinoma CT imaging on admission-positive for liver masses-s/p biopsy on 9/16-pathology consistent  with metastatic cholangiocarcinoma Evaluated by oncology-not a candidate for systemic therapy After extensive discussion by prior physicians/palliative care team-now comfort measures.   Nausea/vomiting/anorexia/failure to thrive syndrome Secondary to above Very poor appetite-had only a little bit of mac & cheese yesterday Continues to have significant nausea-which could be secondary to uremia or from underlying malignancy. Antiemetics as needed.   Acute on chronic HFpEF Initially started on IV diuretics-but became uremic and required dialysis No further plans for dialysis-comfort measures, volume status is reasonable this morning   AKI on CKD 4 Started on hemodialysis as patient became uremic during the early part of this hospitalization Since then-diagnosed with stage IV/metastatic cholangiocarcinoma-comfort measures now in place-no plans for dialysis or checking renal function.   Chronic hypoxic respiratory failure-on home O2 2 L Secondary to pulmonary hypertension and HFpEF Comfort measures   Left lower lobe PNA Completed a course of antibiotics Comfort measures in place   Citrobacter UTI Completed a course of IV cefepime    History of pulmonary hypertension Supportive care   PAF Eliquis  briefly held for liver biopsy Comfort measures in place-no longer on Eliquis .   HTN No longer on antihypertensives-comfort care   Hypothyroidism No longer on Synthroid -comfort care   DM-2 No longer on insulin -no longer checking CBGs-comfort care   Chronic bilateral lower extremity lymphedema History of venous stasis ulcer LLE Unna boots Comfort measures.   Class III obesity: Estimated body mass index is 55.19 kg/m as calculated from the following:   Height as of this encounter: 5' 1 (1.549 m).   Weight as of this encounter: 132.5 kg.    Discharge Diagnoses:  Principal Problem:  CAP (community acquired pneumonia) Active Problems:   Metastatic carcinoma to liver [facility])    Goals of care, counseling/discussion   Discharge Instructions:  Activity:  As tolerated with Full fall precautions use walker/cane & assistance as needed Discharge Instructions     Diet general   Complete by: As directed    Discharge instructions   Complete by: As directed    Follow with Primary MD  Valma Carwin, MD in 1-2 weeks  Please get a complete blood count and chemistry panel checked by your Primary MD at your next visit, and again as instructed by your Primary MD.  Get Medicines reviewed and adjusted: Please take all your medications with you for your next visit with your Primary MD  Laboratory/radiological data: Please request your Primary MD to go over all hospital tests and procedure/radiological results at the follow up, please ask your Primary MD to get all Hospital records sent to his/her office.  In some cases, they will be blood work, cultures and biopsy results pending at the time of your discharge. Please request that your primary care M.D. follows up on these results.  Also Note the following: If you experience worsening of your admission symptoms, develop shortness of breath, life threatening emergency, suicidal or homicidal thoughts you must seek medical attention immediately by calling 911 or calling your MD immediately  if symptoms less severe.  You must read complete instructions/literature along with all the possible adverse reactions/side effects for all the Medicines you take and that have been prescribed to you. Take any new Medicines after you have completely understood and accpet all the possible adverse reactions/side effects.   Do not drive when taking Pain medications or sleeping medications (Benzodaizepines)  Do not take more than prescribed Pain, Sleep and Anxiety Medications. It is not advisable to combine anxiety,sleep and pain medications without talking with your primary care practitioner  Special Instructions: If you have smoked or chewed  Tobacco  in the last 2 yrs please stop smoking, stop any regular Alcohol   and or any Recreational drug use.  Wear Seat belts while driving.  Please note: You were cared for by a hospitalist during your hospital stay. Once you are discharged, your primary care physician will handle any further medical issues. Please note that NO REFILLS for any discharge medications will be authorized once you are discharged, as it is imperative that you return to your primary care physician (or establish a relationship with a primary care physician if you do not have one) for your post hospital discharge needs so that they can reassess your need for medications and monitor your lab values.   Increase activity slowly   Complete by: As directed    No wound care   Complete by: As directed       Allergies as of 12/31/2023       Reactions   Ms Contin [morphine] Hives, Rash, Other (See Comments)   Broke out in brown spots all over.         Medication List     STOP taking these medications    allopurinol  100 MG tablet Commonly known as: ZYLOPRIM    amLODipine  5 MG tablet Commonly known as: NORVASC    ARIPiprazole  2 MG tablet Commonly known as: ABILIFY    Centrum Silver Chew   citalopram  40 MG tablet Commonly known as: CELEXA    diazepam  5 MG tablet Commonly known as: VALIUM    Eliquis  5 MG Tabs tablet Generic drug: apixaban    insulin  aspart 100 UNIT/ML injection  Commonly known as: novoLOG    insulin  lispro 100 UNIT/ML injection Commonly known as: HUMALOG   iron  polysaccharides 150 MG capsule Commonly known as: NIFEREX   Lantus  SoloStar 100 UNIT/ML Solostar Pen Generic drug: insulin  glargine   levothyroxine  100 MCG tablet Commonly known as: SYNTHROID    Melatonin 10 MG Tabs   rosuvastatin  10 MG tablet Commonly known as: CRESTOR    sennosides-docusate sodium  8.6-50 MG tablet Commonly known as: SENOKOT-S       TAKE these medications    acetaminophen  500 MG tablet Commonly  known as: TYLENOL  Take 2 tablets (1,000 mg total) by mouth 3 (three) times daily.   glycopyrrolate  1 MG tablet Commonly known as: ROBINUL  Take 1 tablet (1 mg total) by mouth every 4 (four) hours as needed (excessive secretions).   haloperidol  2 MG/ML solution Commonly known as: HALDOL  Place 0.3 mLs (0.6 mg total) under the tongue every 6 (six) hours as needed for agitation (, nausea).   HYDROmorphone  1 MG/ML injection Commonly known as: DILAUDID  Inject 2 mLs (2 mg total) into the vein every 6 (six) hours.   LORazepam  2 MG/ML concentrated solution Commonly known as: ATIVAN  Place 0.5 mLs (1 mg total) under the tongue every 4 (four) hours as needed for anxiety.   ondansetron  8 mg in sodium chloride  0.9 % 50 mL Inject 8 mg into the vein 3 (three) times daily.   polyethylene glycol 17 g packet Commonly known as: MIRALAX  / GLYCOLAX  Take 17 g by mouth 2 (two) times daily.        Follow-up Information     Valma Carwin, MD Follow up.   Specialty: Internal Medicine Why: As needed Contact information: [address] [facility] [id number] [phone]                Allergies  Allergen Reactions   Ms Contin [Morphine] Hives, Rash and Other (See Comments)    Broke out in brown spots all over.      Other Procedures/Studies: US  BIOPSY (LIVER) Result Date: 12/22/2023 INDICATION: Liver lesions, concerning for metastatic disease, unknown primary EXAM: ULTRASOUND CORE BIOPSY ILL-DEFINED PERIPHERAL RIGHT LIVER LESION MEDICATIONS: 1% lidocaine  local ANESTHESIA/SEDATION: Moderate (conscious) sedation was employed during this procedure. A total of Versed  2.0 mg and Fentanyl  100 mcg was administered intravenously by the radiology nurse. Total intra-service moderate Sedation Time: 11 minutes. The patient's level of consciousness and vital signs were monitored continuously by radiology nursing throughout the procedure under my direct supervision. COMPLICATIONS: None immediate.  PROCEDURE: Informed written consent was obtained from the patient after a thorough discussion of the procedural risks, benefits and alternatives. All questions were addressed. Maximal Sterile Barrier Technique was utilized including caps, mask, sterile gowns, sterile gloves, sterile drape, hand hygiene and skin antiseptic. A timeout was performed prior to the initiation of the procedure. Previous imaging reviewed. Preliminary ultrasound performed. Exam is limited because of the patient's morbid obesity. Imaging the liver with ultrasound is challenging. An ill-defined hypoechoic area/mass in the right liver at the level of the portal vein was localized and marked for biopsy. This was correlated with the 12/16/2023 CT. Overlying skin marked. Under sterile conditions and local anesthesia, the 17 gauge 11.8 cm guide needle was advanced into the right peripheral hypoechoic mass. Needle position confirmed with ultrasound. 2 18 gauge core biopsies obtained. Samples were intact and non fragmented. These were placed in formalin. Needle tract occluded with Gel-Foam. Patient tolerated the procedure well. No immediate complication. IMPRESSION: Successful ultrasound ill-defined peripheral right liver subcapsular mass 18  gauge core biopsies. Electronically Signed   By: CHRISTELLA.  Shick M.D.   On: 12/22/2023 14:39   US  Abdomen Limited RUQ (LIVER/GB) Result Date: 12/21/2023 CLINICAL DATA:  Transaminitis EXAM: ULTRASOUND ABDOMEN LIMITED RIGHT UPPER QUADRANT COMPARISON:  CT scan 12/16/2023 FINDINGS: Gallbladder: Surgically absent Common bile duct: Diameter: 0.6 cm Liver: Abnormal nodular contour of the liver with heterogeneous liver echogenicity and poor sonic penetration. The hypoenhancing masses/lesions throughout the liver shown on 12/16/2023 not well seen, possibly due to lesser conspicuity on ultrasound compared to CT. Portal vein is patent on color Doppler imaging with normal direction of blood flow towards the liver. Other: Small  right pleural effusion. IMPRESSION: 1. Abnormal nodular contour of the liver with heterogeneous liver echogenicity and poor sonic penetration. The hypoenhancing masses/lesions throughout the liver shown on 12/16/2023 not well seen, possibly due to lesser conspicuity on ultrasound compared to CT. 2. Small right pleural effusion. 3. Prior cholecystectomy. Electronically Signed   By: Ryan Salvage M.D.   On: 12/21/2023 20:51   IR TUNNELED CENTRAL VENOUS CATH [facility] W IMG Result Date: 12/21/2023 CLINICAL DATA:  Renal failure and need for tunneled hemodialysis catheter. EXAM: TUNNELED CENTRAL VENOUS HEMODIALYSIS CATHETER PLACEMENT WITH ULTRASOUND AND FLUOROSCOPIC GUIDANCE ANESTHESIA/SEDATION: Moderate (conscious) sedation was employed during this procedure. A total of Versed  1.0 mg and Fentanyl  50 mcg was administered intravenously. Moderate Sedation Time: 28 minutes. The patient's level of consciousness and vital signs were monitored continuously by radiology nursing throughout the procedure under my direct supervision. MEDICATIONS: 1 g IV Ancef . FLUOROSCOPY: Radiation Exposure Index: 18.1 mGy Kerma PROCEDURE: The procedure, risks, benefits, and alternatives were explained to the patient. Questions regarding the procedure were encouraged and answered. The patient understands and consents to the procedure. A timeout was performed prior to initiating the procedure. The right neck and chest were prepped with chlorhexidine  in a sterile fashion, and a sterile drape was applied covering the operative field. Maximum barrier sterile technique with sterile gowns and gloves were used for the procedure. Local anesthesia was provided with 1% lidocaine . Ultrasound was performed to confirm patency of the right internal jugular vein. An ultrasound image was saved and recorded. After creating a small venotomy incision, a 21 gauge needle was advanced into the right internal jugular vein under direct, real-time ultrasound guidance.  After securing guidewire access, an 8 Fr dilator was placed. A J-wire was kinked to measure appropriate catheter length. A Palindrome tunneled hemodialysis catheter measuring 23 cm from tip to cuff was chosen for placement. This was tunneled in a retrograde fashion from the chest wall to the venotomy incision. At the venotomy, serial dilatation was performed and a 15 Fr peel-away sheath was placed over a guidewire. The catheter was then placed through the sheath and the sheath removed. Final catheter positioning was confirmed and documented with a fluoroscopic spot image. The catheter was aspirated, flushed with saline, and injected with appropriate volume heparin  dwells. The venotomy incision was closed with subcutaneous and subcuticular 4-0 Vicryl. Dermabond was applied to the incision. The catheter exit site was secured with 0-Prolene retention sutures. COMPLICATIONS: None.  No pneumothorax. FINDINGS: After catheter placement, the tip lies in the right atrium. The catheter aspirates normally and is ready for immediate use. IMPRESSION: Placement of tunneled hemodialysis catheter via the right internal jugular vein. The catheter tip lies in the right atrium. The catheter is ready for immediate use. Electronically Signed   By: Marcey Moan M.D.   On: 12/21/2023 16:24   DG Abd 1 View Result  Date: 12/20/2023 CLINICAL DATA:  68 year old female with constipation. EXAM: ABDOMEN - 1 VIEW COMPARISON:  CT Abdomen and Pelvis 12/16/2023 and earlier. FINDINGS: AP supine views at 0759 hours. Four views. Non obstructed bowel gas pattern. IVC filter redemonstrated. Stable cholecystectomy clips. Moderate volume retained rectosigmoid colon stool. Mild large bowel retained stool elsewhere. Stable visualized osseous structures. Advanced lower lumbar disc and endplate degeneration. Grossly negative lung bases, cardiac pacemaker lead redemonstrated. IMPRESSION: Non obstructed bowel gas pattern with moderate volume retained stool,  primarily in the rectosigmoid. Electronically Signed   By: VEAR Hurst M.D.   On: 12/20/2023 08:19   CT LIVER ABDOMEN W WO CONTRAST Result Date: 12/16/2023 EXAM: CT ABDOMEN WITH AND WITHOUT CONTRAST 12/16/2023 08:01:00 PM TECHNIQUE: CT of the abdomen was performed with the administration of intravenous contrast. Multiplanar reformatted images are provided for review. Automated exposure control, iterative reconstruction, and/or weight based adjustment of the mA/kV was utilized to reduce the radiation dose to as low as reasonably achievable. COMPARISON: CT 12/15/2023 CLINICAL HISTORY: Liver lesion, > 1cm. FINDINGS: LOWER CHEST: Small right pleural effusion and pneumonia or atelectasis. HEPATOBILIARY: Redemonstrated hypoattenuating lesions throughout the liver with peripheral enhancement on the portal venous phase in the right base. The largest lesion is in the inferior right hepatic lobe measuring 7.9 cm (series 8 image 92). Differential considerations include metastases or hepatic abscesses. Cholecystectomy. SPLEEN: Spleen demonstrates no acute abnormality. PANCREAS: Pancreas demonstrates no acute abnormality. ADRENAL GLANDS: Adrenal glands demonstrate no acute abnormality. KIDNEYS: Nonobstructing left nephrolithiasis. No hydronephrosis. No perinephric or periureteral stranding. GI AND BOWEL: Stomach and duodenal sweep demonstrate no acute abnormality. There is no bowel obstruction. No abnormal bowel wall thickening or distension. PERITONEUM AND RETROPERITONEUM: No ascites or free air. Aorta is normal in caliber. IVC filter. LYMPH NODES: No lymphadenopathy. BONES AND SOFT TISSUES: No acute abnormality of the visualized bones. Body wall edema. IMPRESSION: 1. Hypoattenuating liver lesions with peripheral enhancement, largest measuring 7.9 cm in the inferior right hepatic lobe concerning for metastases. Differential consideration includes hepatic abscesses. Hepatic protocol MRI with and without IV contrast is  recommended. 2. Small right pleural effusion and RLL pneumonia or atelectasis. Electronically signed by: Norman Gatlin MD 12/16/2023 08:53 PM EDT RP Workstation: [id number]   CT Renal Stone Study Result Date: 12/15/2023 EXAM: CT UROGRAM 12/15/2023 11:01:37 PM TECHNIQUE: CT of the abdomen and pelvis was performed before and after the administration of intravenous contrast as per CT urogram protocol. Multiplanar reformatted images as well as MIP urogram images are provided for review. Automated exposure control, iterative reconstruction, and/or weight based adjustment of the mA/kV was utilized to reduce the radiation dose to as low as reasonably achievable. COMPARISON: CT 12/25/2012 CLINICAL HISTORY: Abdominal/flank pain, stone suspected. FINDINGS: LOWER CHEST: Small right pleural effusions. Right lower lobe airspace opacity suspicious for pneumonia. LIVER: Multiple hypoattenuating lesions throughout the liver were not previously seen and are concerning for metastases. These are incompletely assessed without IV contrast. The largest measures approximately 7.7 cm in the inferior right hepatic lobe (series 3, image 36). GALLBLADDER AND BILE DUCTS: Cholecystectomy. SPLEEN: No acute abnormality. PANCREAS: No acute abnormality. ADRENAL GLANDS: No acute abnormality. KIDNEYS, URETERS AND BLADDER: Nonobstructing left nephrolithiasis. No hydronephrosis. No perinephric or periureteral stranding. Urinary bladder is unremarkable. GI AND BOWEL: Stomach demonstrates no acute abnormality. There is no bowel obstruction. PERITONEUM AND RETROPERITONEUM: No ascites. No free air. VASCULATURE: Similar configuration of the IVC filter. Aortic atherosclerotic calcification. LYMPH NODES: No lymphadenopathy. REPRODUCTIVE ORGANS: No acute abnormality. BONES AND SOFT TISSUES: No  acute osseous abnormality. Diffuse edema in the bilateral flanks and anterior abdominal wall pannus. IMPRESSION: 1. Multiple hypoattenuating liver lesions concerning  for metastases, incompletely assessed without IV contrast. Largest measures approximately 7.7 cm in the inferior right hepatic lobe. Further evaluation with CT with IV contrast is recommended. 2. Right lower lobe airspace opacity suspicious for pneumonia. 3. Nonobstructing left nephrolithiasis. No hydronephrosis. Electronically signed by: Norman Gatlin MD 12/15/2023 11:10 PM EDT RP Workstation: [id number]   DG Chest 2 View Result Date: 12/15/2023 CLINICAL DATA:  Chest pain. EXAM: CHEST - 2 VIEW COMPARISON:  Chest radiograph dated 11/11/2023 FINDINGS: There is cardiomegaly with vascular congestion. No focal consolidation, pleural effusion or pneumothorax. Left pectoral pacemaker device. Atherosclerotic calcification of the aorta. No acute osseous pathology. IMPRESSION: Cardiomegaly with vascular congestion.  Pneumonia is not excluded. Electronically Signed   By: Vanetta Chou M.D.   On: 12/15/2023 21:38     TODAY-DAY OF DISCHARGE:  Subjective:   [name] today has no headache,no chest abdominal pain,no new weakness tingling or numbness, feels much better wants to go home today.   Objective:   Blood pressure 120/81, pulse (!) 50, temperature 97.9 F (36.6 C), temperature source Oral, resp. rate 18, height 5' 1 (1.549 m), weight 132.5 kg, SpO2 99%.  Intake/Output Summary (Last 24 hours) at 12/31/2023 0828 Last data filed at 12/31/2023 0415 Gross per 24 hour  Intake 150 ml  Output 200 ml  Net -50 ml   Filed Weights   12/22/23 1342 12/22/23 1621 12/24/23 1745  Weight: (!) 143.4 kg (!) 138.5 kg 132.5 kg    Exam: Awake Alert, Oriented *3, No new F.N deficits, Normal affect [place].AT,PERRAL Supple Neck,No JVD, No cervical lymphadenopathy appriciated.  Symmetrical Chest wall movement, Good air movement bilaterally, CTAB RRR,No Gallops,Rubs or new Murmurs, No Parasternal Heave +ve B.Sounds, Abd Soft, Non tender, No organomegaly appriciated, No rebound -guarding or rigidity. No  Cyanosis, Clubbing or edema, No new Rash or bruise   PERTINENT RADIOLOGIC STUDIES: No results found.   PERTINENT LAB RESULTS: CBC: No results for input(s): WBC, HGB, HCT, PLT in the last 72 hours. CMET CMP     Component Value Date/Time   NA 138 12/25/2023 0219   NA 143 09/18/2023 1606   NA 143 11/16/2014 1140   K 3.9 12/25/2023 0219   K 3.7 11/16/2014 1140   CL 98 12/25/2023 0219   CO2 24 12/25/2023 0219   CO2 31 (H) 11/16/2014 1140   GLUCOSE 110 (H) 12/25/2023 0219   GLUCOSE 71 11/16/2014 1140   BUN 60 (H) 12/25/2023 0219   BUN 82 (HH) 09/18/2023 1606   BUN 31.1 (H) 11/16/2014 1140   CREATININE 2.25 (H) 12/25/2023 0219   CREATININE 1.3 (H) 11/16/2014 1140   CALCIUM  8.6 (L) 12/25/2023 0219   CALCIUM  9.4 11/16/2014 1140   PROT 5.1 (L) 12/22/2023 0324   PROT 6.5 11/16/2014 1140   ALBUMIN  2.8 (L) 12/25/2023 0219   ALBUMIN  3.5 11/16/2014 1140   AST 22 12/22/2023 0324   AST 30 11/16/2014 1140   ALT 14 12/22/2023 0324   ALT 29 11/16/2014 1140   ALKPHOS 70 12/22/2023 0324   ALKPHOS 66 11/16/2014 1140   BILITOT 0.4 12/22/2023 0324   BILITOT 0.62 11/16/2014 1140   EGFR 24 (L) 09/18/2023 1606   GFRNONAA 23 (L) 12/25/2023 0219    GFR Estimated Creatinine Clearance: 31.3 mL/min (A) (by C-G formula based on SCr of 2.25 mg/dL (H)). No results for input(s): LIPASE, AMYLASE in the last 72  hours. No results for input(s): CKTOTAL, CKMB, CKMBINDEX, TROPONINI in the last 72 hours. Invalid input(s): POCBNP No results for input(s): DDIMER in the last 72 hours. No results for input(s): HGBA1C in the last 72 hours. No results for input(s): CHOL, HDL, LDLCALC, TRIG, CHOLHDL, LDLDIRECT in the last 72 hours. No results for input(s): TSH, T4TOTAL, T3FREE, THYROIDAB in the last 72 hours.  Invalid input(s): FREET3 No results for input(s): VITAMINB12, FOLATE, FERRITIN, TIBC, IRON , RETICCTPCT in the last 72 hours. Coags: No  results for input(s): INR in the last 72 hours.  Invalid input(s): PT Microbiology: No results found for this or any previous visit (from the past 240 hours).  FURTHER DISCHARGE INSTRUCTIONS:  Get Medicines reviewed and adjusted: Please take all your medications with you for your next visit with your Primary MD  Laboratory/radiological data: Please request your Primary MD to go over all hospital tests and procedure/radiological results at the follow up, please ask your Primary MD to get all Hospital records sent to his/her office.  In some cases, they will be blood work, cultures and biopsy results pending at the time of your discharge. Please request that your primary care M.D. goes through all the records of your hospital data and follows up on these results.  Also Note the following: If you experience worsening of your admission symptoms, develop shortness of breath, life threatening emergency, suicidal or homicidal thoughts you must seek medical attention immediately by calling 911 or calling your MD immediately  if symptoms less severe.  You must read complete instructions/literature along with all the possible adverse reactions/side effects for all the Medicines you take and that have been prescribed to you. Take any new Medicines after you have completely understood and accpet all the possible adverse reactions/side effects.   Do not drive when taking Pain medications or sleeping medications (Benzodaizepines)  Do not take more than prescribed Pain, Sleep and Anxiety Medications. It is not advisable to combine anxiety,sleep and pain medications without talking with your primary care practitioner  Special Instructions: If you have smoked or chewed Tobacco  in the last 2 yrs please stop smoking, stop any regular Alcohol   and or any Recreational drug use.  Wear Seat belts while driving.  Please note: You were cared for by a hospitalist during your hospital stay. Once you are  discharged, your primary care physician will handle any further medical issues. Please note that NO REFILLS for any discharge medications will be authorized once you are discharged, as it is imperative that you return to your primary care physician (or establish a relationship with a primary care physician if you do not have one) for your post hospital discharge needs so that they can reassess your need for medications and monitor your lab values.  Total Time spent coordinating discharge including counseling, education and face to face time equals greater than 30 minutes.  SignedBETHA Donalda Applebaum 12/31/2023 8:28 AM

## 2023-12-31 NOTE — Progress Notes (Signed)
 Daily Progress Note   Date: 12/31/2023   Patient Name: [name]  DOB: September 28, 1955  MRN: [id number]  Age / Sex: 68 y.o., female  Attending Physician: Raenelle Donalda HERO, MD Primary Care Physician: Valma Carwin, MD Admit Date: 12/15/2023 Length of Stay: 14 days  Reason for Follow-up: Non pain symptom management, Pain control, Psychosocial/spiritual support, and Terminal Care  Past Medical History:  Diagnosis Date   A-fib (HCC)    Anemia    Anticoagulated on Coumadin , chronically 09/03/2011   Anxiety    Arthritis    right hip; both knees; left wrist/shoulder; back (01/19/2013   Bleeding on Coumadin  08/2012; 01/18/2013   [facility] admissions (01/19/2013)   CHF (congestive heart failure) (HCC)    2-3 times (01/19/2013)   Chronic lower back pain    Depression    DVT (deep venous thrombosis) (HCC) 10 years ago   numerous/notes 01/18/2013   GERD (gastroesophageal reflux disease)    Gout    Headache(784.0)    maybe weekly (01/19/2013)   Heart murmur    High cholesterol    been off RX for this at one time (01/18/2013)   History of blood transfusion 1983; 04/2012   3 w/ childbirth; hospitalized for pain (01/19/2013)   Hypertension    Hypothyroidism    Migraines    twice/yr maybe (01/19/2013)   Obstructive sleep apnea 05/03/2012   OSA (obstructive sleep apnea)    sent me for test in 04/2012; never ordered mask, etc (01/19/2013)   PE (pulmonary thromboembolism) (HCC) 3 years ag0   3/notes 01/18/2013   Pneumonia before 2011   once' (01/18/2013)   Renal disorder    kindey function low; Metformin was destroying my kidneys (01/19/2013)   Shortness of breath    only related to my CHF (01/18/2013)   Swelling of hand 08/31/2014   RT HAND   Type II diabetes mellitus (HCC)    UTI (urinary tract infection) 08/31/2014    Subjective:   Subjective: Chart Reviewed. Updates received. Patient Assessed. Created space and opportunity for patient  and family to explore  thoughts and feelings regarding current medical situation.  Today's Discussion: Today before meeting with the patient/family, I reviewed the chart notes including TOC notes from yesterday, nursing note from yesterday, discharge summary from today, radiology note from today. I also reviewed vital signs, nursing flowsheets, and medication administrations record. No labs due to comfort care status.  Vital signs today include temperature 97.4, heart rate 50, respiratory rate 18, blood pressure 109/55, satting 99% on 3 L/min nasal cannula.  Comfort medications administered include 1 dose of Robinul  yesterday morning, none today.  Scheduled Dilaudid  received twice yesterday twice thus far today.  Zofran  received scheduled every 8 hours.  Because of actual and anticipated symptoms we will continue parenteral controlled substances.  Today saw the patient at bedside, no family was present.  She was awake, alert and made/kept eye contact during our conversation.  She currently denies pain, admits that when she does have pain the pain medicine does help.  I encouraged her to ask for pain medicine when needed.  She asks what facility she is going to and I told her that it appears to be [facility] [place] in [place].  I spent time talking about hospice care and inpatient hospice care.  I allowed room for her to explore her feelings.  She shared that she has given away most of her possessions with people who need them.  She seems to have a  very giving heart.  We talked about hospice philosophy and keeping her comfortable.  She knows her time is quite limited and wants to maximize her quality of life for however much time she has left.  After receiving a update from the social worker I told her that it appears they are trying to secure a bed for her today at [facility] in [place].  I told her if she does not transfer today and is still here tomorrow I would come back and see her again and she  agreed. I provided emotional and general support through therapeutic listening, empathy, sharing of stories, and other techniques. I answered all questions and addressed all concerns to the best of my ability.  Review of Systems  Constitutional:  Positive for fatigue.       Denies pain in general currently  Respiratory:  Negative for shortness of breath.   Gastrointestinal:  Negative for abdominal pain, nausea and vomiting.    Objective:   Primary Diagnoses: Present on Admission:  CAP (community acquired pneumonia)   Vital Signs:  BP (!) 109/55 (BP Location: Left Arm)   Pulse (!) 50   Temp (!) 97.4 F (36.3 C) (Oral)   Resp 18   Ht 5' 1 (1.549 m)   Wt 132.5 kg   SpO2 99%   BMI 55.19 kg/m   Physical Exam Vitals and nursing note reviewed.  Constitutional:      General: She is not in acute distress.    Appearance: She is obese. She is ill-appearing.  HENT:     Head: Normocephalic and atraumatic.  Cardiovascular:     Rate and Rhythm: Normal rate.  Pulmonary:     Effort: Pulmonary effort is normal. No respiratory distress.  Abdominal:     General: Abdomen is flat.  Skin:    General: Skin is warm and dry.  Neurological:     General: No focal deficit present.     Mental Status: She is alert and oriented to person, place, and time.  Psychiatric:        Mood and Affect: Mood normal.        Behavior: Behavior normal.     Palliative Assessment/Data: 20-30%   Existing Vynca/ACP Documentation: MOST form signed 09/08/2023 Advance directive signed 09/08/2023 DNR effective 12/30/2023 Goals of care document signed 09/09/2023  Assessment & Plan:   HPI/Patient Profile:  68 y.o. female  with past medical history of pulmonary hypertension, chronic respiratory failure requiring 2L [place] at baseline, Afib on Eliquis , HFpEF (EF 60-65% in 08/2023), DM2, DVT/PE x3 s/p IVC filter, CKD stage IV, and morbid obesity admitted on 12/15/2023 with shortness of breath and chest pain.    In the ED,  patient was found to be in volume overload secondary to left lower lobe pneumonia and HFpEF exacerbation with worsening renal failure.  Dialysis was started on 9/15.  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  Oncology has evaluated and recommended consideration of hospice given her poor functional status and not a good candidate for chemotherapy.  SUMMARY OF RECOMMENDATIONS   DNR-comfort Continue comfort care See symptom management orders below No changes needed currently for symptom management Anticipate transfer to hosp[facility] to the [facility] today Palliative medicine will continue to follow for comfort care while admitted to the hospital  Symptom Management:  Scheduled dilaudid  q6h; increased PRN dose range to 2-4mg  as needed for pain/dyspnea/increased work of breathing/RR>25 Scheduled tylenol   TID Biotin  PRN dry mouth Benadryl  PRN itching Robinul  PRN secretions Haldol  PRN agitation/delirium Ativan  PRN anxiety/seizure/sleep/distress Scheduled zofran  TID; continue PRN doses for breakthrough nausea/vomiting Phenergan  PRN nausea/vomiting Liquifilm Tears PRN dry eye Continue Abilify , celexa , melatonin, miralax , and senna-docusate while tolerating POs  Code Status: DNR - Comfort  Prognosis: Hours - Days  Discharge Planning: Hospice facility  Discussed with: Patient, medical team, nursing team  Thank you for allowing us  to participate in the care of NEOMI LAIDLER PMT will continue to support holistically.  Billing based on MDM: High  Problems Addressed: One acute or chronic illness or injury that poses a threat to life or bodily function  Risks: Parenteral controlled substances  Detailed review of medical records (labs, imaging, vital signs), medically appropriate exam, discussed with treatment team, counseling and education to patient, family, & staff, documenting clinical information,  medication management, coordination of care  Camellia Kays, NP Palliative Medicine Team  Team Phone # [phone] (Nights/Weekends)  12/04/2020, 8:17 AM

## 2024-01-04 ENCOUNTER — Ambulatory Visit (HOSPITAL_BASED_OUTPATIENT_CLINIC_OR_DEPARTMENT_OTHER): Admitting: Internal Medicine

## 2024-01-08 ENCOUNTER — Encounter: Admitting: Surgery

## 2024-02-04 ENCOUNTER — Ambulatory Visit: Admitting: Pulmonary Disease

## 2024-03-29 ENCOUNTER — Ambulatory Visit: Attending: Internal Medicine | Admitting: Internal Medicine

## 2024-08-23 ENCOUNTER — Ambulatory Visit: Payer: MEDICAID

## 2024-11-22 ENCOUNTER — Ambulatory Visit: Payer: MEDICAID

## 2025-02-21 ENCOUNTER — Ambulatory Visit: Payer: MEDICAID

## 2025-05-23 ENCOUNTER — Ambulatory Visit: Payer: MEDICAID

## 2025-08-22 ENCOUNTER — Ambulatory Visit: Payer: MEDICAID

## 2025-11-21 ENCOUNTER — Ambulatory Visit: Payer: MEDICAID

## 2026-02-20 ENCOUNTER — Ambulatory Visit: Payer: MEDICAID

## 2026-05-22 ENCOUNTER — Ambulatory Visit: Payer: MEDICAID
